# Patient Record
Sex: Female | Born: 1965
Health system: Southern US, Community
[De-identification: ages and names within clinical notes are randomized; demographics above are authoritative.]

## PROBLEM LIST (undated history)

## (undated) DIAGNOSIS — D649 Anemia, unspecified: Secondary | ICD-10-CM

## (undated) DIAGNOSIS — T8571XA Infection and inflammatory reaction due to peritoneal dialysis catheter, initial encounter: Secondary | ICD-10-CM

## (undated) DIAGNOSIS — I2510 Atherosclerotic heart disease of native coronary artery without angina pectoris: Secondary | ICD-10-CM

## (undated) DIAGNOSIS — R52 Pain, unspecified: Secondary | ICD-10-CM

## (undated) DIAGNOSIS — Z992 Dependence on renal dialysis: Secondary | ICD-10-CM

## (undated) DIAGNOSIS — N189 Chronic kidney disease, unspecified: Secondary | ICD-10-CM

## (undated) DIAGNOSIS — Z9289 Personal history of other medical treatment: Secondary | ICD-10-CM

## (undated) DIAGNOSIS — J189 Pneumonia, unspecified organism: Secondary | ICD-10-CM

## (undated) DIAGNOSIS — R002 Palpitations: Secondary | ICD-10-CM

## (undated) DIAGNOSIS — E785 Hyperlipidemia, unspecified: Secondary | ICD-10-CM

## (undated) DIAGNOSIS — E119 Type 2 diabetes mellitus without complications: Secondary | ICD-10-CM

## (undated) DIAGNOSIS — N186 End stage renal disease: Secondary | ICD-10-CM

## (undated) DIAGNOSIS — I10 Essential (primary) hypertension: Secondary | ICD-10-CM

## (undated) HISTORY — DX: Hyperlipidemia, unspecified: E78.5

## (undated) HISTORY — DX: Morbid (severe) obesity due to excess calories: E66.01

## (undated) HISTORY — DX: Palpitations: R00.2

## (undated) HISTORY — PX: CARPAL TUNNEL RELEASE: SHX101

---

## 1991-01-20 HISTORY — PX: TUBAL LIGATION: SHX77

## 1998-05-28 ENCOUNTER — Emergency Department (HOSPITAL_COMMUNITY): Admission: EM | Admit: 1998-05-28 | Discharge: 1998-05-28 | Payer: Self-pay | Admitting: Emergency Medicine

## 1998-05-29 ENCOUNTER — Emergency Department (HOSPITAL_COMMUNITY): Admission: EM | Admit: 1998-05-29 | Discharge: 1998-05-29 | Payer: Self-pay | Admitting: Emergency Medicine

## 1998-06-01 ENCOUNTER — Emergency Department (HOSPITAL_COMMUNITY): Admission: EM | Admit: 1998-06-01 | Discharge: 1998-06-01 | Payer: Self-pay | Admitting: Emergency Medicine

## 1998-09-10 ENCOUNTER — Emergency Department (HOSPITAL_COMMUNITY): Admission: EM | Admit: 1998-09-10 | Discharge: 1998-09-10 | Payer: Self-pay | Admitting: Emergency Medicine

## 1999-01-20 DIAGNOSIS — I10 Essential (primary) hypertension: Secondary | ICD-10-CM | POA: Insufficient documentation

## 1999-01-20 DIAGNOSIS — E119 Type 2 diabetes mellitus without complications: Secondary | ICD-10-CM | POA: Insufficient documentation

## 2000-02-09 ENCOUNTER — Emergency Department (HOSPITAL_COMMUNITY): Admission: EM | Admit: 2000-02-09 | Discharge: 2000-02-10 | Payer: Self-pay | Admitting: Emergency Medicine

## 2001-02-25 ENCOUNTER — Encounter: Admission: RE | Admit: 2001-02-25 | Discharge: 2001-05-26 | Payer: Self-pay | Admitting: Endocrinology

## 2001-03-26 ENCOUNTER — Emergency Department (HOSPITAL_COMMUNITY): Admission: EM | Admit: 2001-03-26 | Discharge: 2001-03-26 | Payer: Self-pay | Admitting: Emergency Medicine

## 2002-01-12 ENCOUNTER — Emergency Department (HOSPITAL_COMMUNITY): Admission: EM | Admit: 2002-01-12 | Discharge: 2002-01-12 | Payer: Self-pay | Admitting: *Deleted

## 2002-05-14 ENCOUNTER — Emergency Department (HOSPITAL_COMMUNITY): Admission: EM | Admit: 2002-05-14 | Discharge: 2002-05-14 | Payer: Self-pay | Admitting: Emergency Medicine

## 2002-07-26 ENCOUNTER — Encounter: Admission: RE | Admit: 2002-07-26 | Discharge: 2002-10-24 | Payer: Self-pay | Admitting: Endocrinology

## 2002-10-27 ENCOUNTER — Encounter: Admission: RE | Admit: 2002-10-27 | Discharge: 2003-01-25 | Payer: Self-pay | Admitting: Endocrinology

## 2008-01-11 ENCOUNTER — Ambulatory Visit: Payer: Self-pay | Admitting: Nurse Practitioner

## 2008-01-11 DIAGNOSIS — L659 Nonscarring hair loss, unspecified: Secondary | ICD-10-CM | POA: Insufficient documentation

## 2008-01-11 LAB — CONVERTED CEMR LAB
Bilirubin Urine: NEGATIVE
Blood Glucose, Fingerstick: 283
Blood in Urine, dipstick: NEGATIVE
Glucose, Urine, Semiquant: 250
Ketones, urine, test strip: NEGATIVE
Nitrite: NEGATIVE
Protein, U semiquant: 100
Specific Gravity, Urine: 1.015
Urobilinogen, UA: 0.2
pH: 6

## 2008-01-16 ENCOUNTER — Encounter (INDEPENDENT_AMBULATORY_CARE_PROVIDER_SITE_OTHER): Payer: Self-pay | Admitting: Nurse Practitioner

## 2008-01-16 DIAGNOSIS — R809 Proteinuria, unspecified: Secondary | ICD-10-CM | POA: Insufficient documentation

## 2008-01-16 LAB — CONVERTED CEMR LAB
ALT: 21 units/L (ref 0–35)
AST: 16 units/L (ref 0–37)
Albumin: 4 g/dL (ref 3.5–5.2)
Alkaline Phosphatase: 71 units/L (ref 39–117)
BUN: 26 mg/dL — ABNORMAL HIGH (ref 6–23)
Basophils Absolute: 0 10*3/uL (ref 0.0–0.1)
Basophils Relative: 0 % (ref 0–1)
CO2: 23 meq/L (ref 19–32)
Calcium: 9.8 mg/dL (ref 8.4–10.5)
Chloride: 100 meq/L (ref 96–112)
Creatinine, Ser: 1.24 mg/dL — ABNORMAL HIGH (ref 0.40–1.20)
Creatinine, Urine: 112.1 mg/dL
Eosinophils Absolute: 0.1 10*3/uL (ref 0.0–0.7)
Eosinophils Relative: 2 % (ref 0–5)
Glucose, Bld: 272 mg/dL — ABNORMAL HIGH (ref 70–99)
HCT: 39.6 % (ref 36.0–46.0)
Hemoglobin: 13.1 g/dL (ref 12.0–15.0)
Hgb A1c MFr Bld: 11.3 % — ABNORMAL HIGH (ref 4.6–6.1)
Lymphocytes Relative: 43 % (ref 12–46)
Lymphs Abs: 3.7 10*3/uL (ref 0.7–4.0)
MCHC: 33.1 g/dL (ref 30.0–36.0)
MCV: 87.6 fL (ref 78.0–100.0)
Microalb Creat Ratio: 340.8 mg/g — ABNORMAL HIGH (ref 0.0–30.0)
Microalb, Ur: 38.2 mg/dL — ABNORMAL HIGH (ref 0.00–1.89)
Monocytes Absolute: 0.8 10*3/uL (ref 0.1–1.0)
Monocytes Relative: 9 % (ref 3–12)
Neutro Abs: 3.9 10*3/uL (ref 1.7–7.7)
Neutrophils Relative %: 46 % (ref 43–77)
Platelets: 347 10*3/uL (ref 150–400)
Potassium: 5.3 meq/L (ref 3.5–5.3)
RBC: 4.52 M/uL (ref 3.87–5.11)
RDW: 13.2 % (ref 11.5–15.5)
Sodium: 136 meq/L (ref 135–145)
TSH: 1.187 microintl units/mL (ref 0.350–4.50)
Total Bilirubin: 0.3 mg/dL (ref 0.3–1.2)
Total Protein: 7.5 g/dL (ref 6.0–8.3)
WBC: 8.5 10*3/uL (ref 4.0–10.5)

## 2008-01-18 ENCOUNTER — Ambulatory Visit: Payer: Self-pay | Admitting: *Deleted

## 2008-01-24 ENCOUNTER — Encounter (INDEPENDENT_AMBULATORY_CARE_PROVIDER_SITE_OTHER): Payer: Self-pay | Admitting: Nurse Practitioner

## 2008-02-02 ENCOUNTER — Emergency Department (HOSPITAL_COMMUNITY): Admission: EM | Admit: 2008-02-02 | Discharge: 2008-02-02 | Payer: Self-pay | Admitting: Emergency Medicine

## 2008-02-07 ENCOUNTER — Encounter (INDEPENDENT_AMBULATORY_CARE_PROVIDER_SITE_OTHER): Payer: Self-pay | Admitting: Nurse Practitioner

## 2008-02-08 ENCOUNTER — Ambulatory Visit: Payer: Self-pay | Admitting: Nurse Practitioner

## 2008-02-08 DIAGNOSIS — Z992 Dependence on renal dialysis: Secondary | ICD-10-CM

## 2008-02-08 DIAGNOSIS — E669 Obesity, unspecified: Secondary | ICD-10-CM | POA: Insufficient documentation

## 2008-02-08 DIAGNOSIS — N186 End stage renal disease: Secondary | ICD-10-CM | POA: Insufficient documentation

## 2008-02-08 LAB — CONVERTED CEMR LAB: Blood Glucose, Fingerstick: 162

## 2008-02-09 ENCOUNTER — Encounter (INDEPENDENT_AMBULATORY_CARE_PROVIDER_SITE_OTHER): Payer: Self-pay | Admitting: Nurse Practitioner

## 2008-02-09 LAB — CONVERTED CEMR LAB
Cholesterol: 222 mg/dL — ABNORMAL HIGH (ref 0–200)
HDL: 54 mg/dL (ref >39)
LDL Cholesterol: 148 mg/dL — ABNORMAL HIGH (ref 0–99)
Total CHOL/HDL Ratio: 4.1
Triglycerides: 101 mg/dL (ref <150)
VLDL: 20 mg/dL (ref 0–40)

## 2008-04-09 ENCOUNTER — Ambulatory Visit: Payer: Self-pay | Admitting: Nurse Practitioner

## 2008-04-09 DIAGNOSIS — E785 Hyperlipidemia, unspecified: Secondary | ICD-10-CM | POA: Insufficient documentation

## 2008-04-09 LAB — CONVERTED CEMR LAB
ALT: 24 units/L (ref 0–35)
AST: 16 units/L (ref 0–37)
Albumin: 4.1 g/dL (ref 3.5–5.2)
Alkaline Phosphatase: 62 units/L (ref 39–117)
BUN: 17 mg/dL (ref 6–23)
Blood Glucose, Fingerstick: 153
CO2: 25 meq/L (ref 19–32)
Calcium: 9.9 mg/dL (ref 8.4–10.5)
Chloride: 103 meq/L (ref 96–112)
Cholesterol, target level: 200 mg/dL
Creatinine, Ser: 0.91 mg/dL (ref 0.40–1.20)
Glucose, Bld: 175 mg/dL — ABNORMAL HIGH (ref 70–99)
HDL goal, serum: 40 mg/dL
Hgb A1c MFr Bld: 8.2 %
LDL Goal: 100 mg/dL
Potassium: 5.1 meq/L (ref 3.5–5.3)
Sodium: 141 meq/L (ref 135–145)
Total Bilirubin: 0.3 mg/dL (ref 0.3–1.2)
Total Protein: 7.6 g/dL (ref 6.0–8.3)

## 2008-04-10 ENCOUNTER — Encounter (INDEPENDENT_AMBULATORY_CARE_PROVIDER_SITE_OTHER): Payer: Self-pay | Admitting: Nurse Practitioner

## 2008-04-12 ENCOUNTER — Ambulatory Visit: Payer: Self-pay | Admitting: Nurse Practitioner

## 2008-06-06 ENCOUNTER — Ambulatory Visit: Payer: Self-pay | Admitting: Nurse Practitioner

## 2008-06-07 ENCOUNTER — Encounter (INDEPENDENT_AMBULATORY_CARE_PROVIDER_SITE_OTHER): Payer: Self-pay | Admitting: Nurse Practitioner

## 2008-06-07 LAB — CONVERTED CEMR LAB
Cholesterol: 182 mg/dL (ref 0–200)
HDL: 50 mg/dL (ref >39)
LDL Cholesterol: 115 mg/dL — ABNORMAL HIGH (ref 0–99)
Total CHOL/HDL Ratio: 3.6
Triglycerides: 84 mg/dL (ref <150)
VLDL: 17 mg/dL (ref 0–40)

## 2008-06-25 ENCOUNTER — Telehealth (INDEPENDENT_AMBULATORY_CARE_PROVIDER_SITE_OTHER): Payer: Self-pay | Admitting: Nurse Practitioner

## 2008-07-11 ENCOUNTER — Ambulatory Visit: Payer: Self-pay | Admitting: Nurse Practitioner

## 2008-07-11 LAB — CONVERTED CEMR LAB
Blood Glucose, Fingerstick: 178
Hgb A1c MFr Bld: 7.8 %

## 2008-07-13 ENCOUNTER — Encounter (INDEPENDENT_AMBULATORY_CARE_PROVIDER_SITE_OTHER): Payer: Self-pay | Admitting: Nurse Practitioner

## 2008-11-09 ENCOUNTER — Telehealth (INDEPENDENT_AMBULATORY_CARE_PROVIDER_SITE_OTHER): Payer: Self-pay | Admitting: Nurse Practitioner

## 2009-01-08 ENCOUNTER — Telehealth (INDEPENDENT_AMBULATORY_CARE_PROVIDER_SITE_OTHER): Payer: Self-pay | Admitting: *Deleted

## 2009-01-10 ENCOUNTER — Ambulatory Visit: Payer: Self-pay | Admitting: Nurse Practitioner

## 2009-01-10 LAB — CONVERTED CEMR LAB
Bilirubin Urine: NEGATIVE
Blood Glucose, Fingerstick: 142
Blood in Urine, dipstick: NEGATIVE
Glucose, Urine, Semiquant: NEGATIVE
Hgb A1c MFr Bld: 12 %
Ketones, urine, test strip: NEGATIVE
Nitrite: NEGATIVE
Protein, U semiquant: >=300
Specific Gravity, Urine: 1.025
Urobilinogen, UA: 0.2
WBC Urine, dipstick: NEGATIVE
pH: 6

## 2009-01-19 LAB — HM PAP SMEAR: HM Pap smear: NORMAL

## 2009-02-08 ENCOUNTER — Ambulatory Visit: Payer: Self-pay | Admitting: Nurse Practitioner

## 2009-02-08 DIAGNOSIS — J069 Acute upper respiratory infection, unspecified: Secondary | ICD-10-CM | POA: Insufficient documentation

## 2009-02-08 LAB — CONVERTED CEMR LAB: Blood Glucose, Fingerstick: 128

## 2009-05-17 ENCOUNTER — Emergency Department (HOSPITAL_COMMUNITY): Admission: EM | Admit: 2009-05-17 | Discharge: 2009-05-17 | Payer: Self-pay | Admitting: Family Medicine

## 2009-08-21 ENCOUNTER — Telehealth (INDEPENDENT_AMBULATORY_CARE_PROVIDER_SITE_OTHER): Payer: Self-pay | Admitting: Nurse Practitioner

## 2009-09-12 ENCOUNTER — Emergency Department (HOSPITAL_COMMUNITY): Admission: EM | Admit: 2009-09-12 | Discharge: 2009-09-12 | Payer: Self-pay | Admitting: Family Medicine

## 2009-09-17 ENCOUNTER — Telehealth (INDEPENDENT_AMBULATORY_CARE_PROVIDER_SITE_OTHER): Payer: Self-pay | Admitting: Nurse Practitioner

## 2009-09-17 ENCOUNTER — Ambulatory Visit: Payer: Self-pay | Admitting: Nurse Practitioner

## 2009-09-19 ENCOUNTER — Encounter (INDEPENDENT_AMBULATORY_CARE_PROVIDER_SITE_OTHER): Payer: Self-pay | Admitting: Nurse Practitioner

## 2009-10-01 ENCOUNTER — Ambulatory Visit (HOSPITAL_COMMUNITY): Admission: RE | Admit: 2009-10-01 | Discharge: 2009-10-01 | Payer: Self-pay | Admitting: Internal Medicine

## 2009-11-27 ENCOUNTER — Telehealth (INDEPENDENT_AMBULATORY_CARE_PROVIDER_SITE_OTHER): Payer: Self-pay | Admitting: Nurse Practitioner

## 2010-01-19 LAB — HM MAMMOGRAPHY: HM Mammogram: NORMAL

## 2010-01-19 LAB — HM DIABETES EYE EXAM

## 2010-02-16 LAB — CONVERTED CEMR LAB
ALT: 19 units/L (ref 0–35)
AST: 17 units/L (ref 0–37)
Albumin: 4 g/dL (ref 3.5–5.2)
Alkaline Phosphatase: 64 units/L (ref 39–117)
BUN: 22 mg/dL (ref 6–23)
Basophils Absolute: 0 10*3/uL (ref 0.0–0.1)
Basophils Relative: 0 % (ref 0–1)
Bilirubin Urine: NEGATIVE
Blood Glucose, Fingerstick: 155
Blood in Urine, dipstick: NEGATIVE
CO2: 26 meq/L (ref 19–32)
Calcium: 9.3 mg/dL (ref 8.4–10.5)
Chlamydia, DNA Probe: NEGATIVE
Chloride: 101 meq/L (ref 96–112)
Cholesterol: 248 mg/dL — ABNORMAL HIGH (ref 0–200)
Creatinine, Ser: 1.13 mg/dL (ref 0.40–1.20)
Eosinophils Absolute: 0.1 10*3/uL (ref 0.0–0.7)
Eosinophils Relative: 2 % (ref 0–5)
GC Probe Amp, Genital: NEGATIVE
Glucose, Bld: 153 mg/dL — ABNORMAL HIGH (ref 70–99)
Glucose, Urine, Semiquant: NEGATIVE
HCT: 38.7 % (ref 36.0–46.0)
HDL: 58 mg/dL (ref >39)
Hemoglobin: 12.7 g/dL (ref 12.0–15.0)
Hgb A1c MFr Bld: 9.3 % — ABNORMAL HIGH (ref <5.7)
KOH Prep: NEGATIVE
Ketones, urine, test strip: NEGATIVE
LDL Cholesterol: 163 mg/dL — ABNORMAL HIGH (ref 0–99)
Lymphocytes Relative: 41 % (ref 12–46)
Lymphs Abs: 2.6 10*3/uL (ref 0.7–4.0)
MCHC: 32.8 g/dL (ref 30.0–36.0)
MCV: 89.8 fL (ref 78.0–100.0)
Microalb, Ur: 159 mg/dL — ABNORMAL HIGH (ref 0.00–1.89)
Monocytes Absolute: 0.6 10*3/uL (ref 0.1–1.0)
Monocytes Relative: 10 % (ref 3–12)
Neutro Abs: 3 10*3/uL (ref 1.7–7.7)
Neutrophils Relative %: 47 % (ref 43–77)
Nitrite: NEGATIVE
OCCULT 1: NEGATIVE
Pap Smear: NEGATIVE
Platelets: 336 10*3/uL (ref 150–400)
Potassium: 5 meq/L (ref 3.5–5.3)
Protein, U semiquant: >=300
RBC: 4.31 M/uL (ref 3.87–5.11)
RDW: 14.1 % (ref 11.5–15.5)
Sodium: 141 meq/L (ref 135–145)
Specific Gravity, Urine: >=1.03
TSH: 1.834 microintl units/mL (ref 0.350–4.500)
Total Bilirubin: 0.4 mg/dL (ref 0.3–1.2)
Total CHOL/HDL Ratio: 4.3
Total Protein: 7.2 g/dL (ref 6.0–8.3)
Triglycerides: 137 mg/dL (ref <150)
Urobilinogen, UA: 0.2
VLDL: 27 mg/dL (ref 0–40)
WBC Urine, dipstick: NEGATIVE
WBC: 6.4 10*3/uL (ref 4.0–10.5)
pH: 5.5

## 2010-02-18 NOTE — Assessment & Plan Note (Signed)
Summary: Acute - URI   Vital Signs:  Patient profile:   45 year old female Menstrual status:  no cycle in the last 5 years Weight:      270 pounds Pulse rate:   84 / minute Pulse rhythm:   regular Resp:     20 per minute BP sitting:   130 / 100  (left arm) Cuff size:   regular  Vitals Entered By: [name] (February 08, 2009 9:10 AM) CC: nasal and head congestion, real bad cough with vomiting on last night x 4 days Pain Assessment Patient in pain? no      CBG Result 128 CBG Device ID A  Does patient need assistance? Functional Status Self care Ambulation Normal   CC:  nasal and head congestion and real bad cough with vomiting on last night x 4 days.  History of Present Illness:  Pt into the office with URI for the past 5 days. +cough (non-productive) +nasal congestion -fever -ear pain  +eye pressure +headache x 1 day +decrease in appetitie  +fatigue Symptoms ongoing and usually when she gets a regular cold it only lasts for about 3 days.  Since this has lasted for longer and even progressed that is why she presents today.  Pt has been able to go to work all week but on yesterday felt the fatigue and cough was the worse. Pt has taken Dayquil OTC during the day.  Habits & Providers  Alcohol-Tobacco-Diet     Alcohol drinks/day: 0     Tobacco Status: never  Exercise-Depression-Behavior     Does Patient Exercise: yes     Exercise Counseling: to improve exercise regimen     Type of exercise: walking     Have you felt down or hopeless? no     Have you felt little pleasure in things? no     Depression Counseling: not indicated; screening negative for depression     Drug Use: no     Seat Belt Use: 100     Sun Exposure: occasionally  Allergies (verified): 1)  ! Vicodin 2)  ! * Tylenol 3  Review of Systems General:  Complains of fatigue and loss of appetite. ENT:  Complains of nasal congestion and sinus pressure; denies ear discharge and sore  throat. CV:  Denies chest pain or discomfort. Resp:  Complains of cough; denies wheezing. GI:  Complains of vomiting; x 1 episode last night. Neuro:  Complains of headaches.  Physical Exam  General:  alert.  obese, sickly appearing Head:  normocephalic.   Ears:  ear piercing(s) noted.   Bil ears with minimal cerumen able to see top of TM - left erythema Nose:  inflammed turbinates Mouth:  fair dentition.   Neck:  supple.     Impression & Recommendations:  Problem # 1:  URI (ICD-465.9)  given diabetes and length of symptoms will treat with amoxil advised pt only to take tussin for cough humdifier for congestion  Orders: Pulse Oximetry (single measurment) [id number])  Problem # 2:  DIABETES MELLITUS, TYPE II (ICD-250.00) continue current meds f/u on next visit Her updated medication list for this problem includes:    Levemir 100 Unit/ml Soln (Insulin detemir) .Marland KitchenMarland KitchenMarland KitchenMarland Kitchen 60 units at night for diabetes    Lisinopril-hydrochlorothiazide 20-25 Mg Tabs (Lisinopril-hydrochlorothiazide) .Marland Kitchen... Take one tablet by mouth daily for blood pressure    Metformin Hcl 1000 Mg Tabs (Metformin hcl) .Marland Kitchen... 1 tablet by mouth two times a day for diabetes  Orders: Capillary Blood Glucose/CBG [id number])  Complete Medication List: 1)  Levemir 100 Unit/ml Soln (Insulin detemir) .... 60 units at night for diabetes 2)  Lisinopril-hydrochlorothiazide 20-25 Mg Tabs (Lisinopril-hydrochlorothiazide) .... Take one tablet by mouth daily for blood pressure 3)  Metformin Hcl 1000 Mg Tabs (Metformin hcl) .Marland Kitchen.. 1 tablet by mouth two times a day for diabetes 4)  Insulin Syringe 31g X 5/16" 0.5 Ml Misc (Insulin syringe-needle u-100) .... To use with 60 units of insulin nightly 5)  Glucometer Elite Classic Kit (Blood glucose monitoring suppl) .... Dispense glucometer and test strips and lancets checks blood sugar twice daily 6)  Neurontin 300 Mg Caps (Gabapentin) .Marland Kitchen.. 1 capsule by mouth nightly as needed for foot pain 7)   Pravastatin Sodium 40 Mg Tabs (Pravastatin sodium) .Marland Kitchen.. 1 tablet by mouth nightly for cholesterol **note dose increase** 8)  Amoxicillin 500 Mg Caps (Amoxicillin) .... One capsule by mouth three times a day for infection  Patient Instructions: 1)  Given length of illness and history of diabetes will treat with amoxil 500mg  by mouth three times a day x 7 days. 2)  Take only tussin for cough (this is for diabetes) 3)  Decongestants raise your blood pressure (it was slightly elevated today) 4)  Follow up at next scheduled diabetes visit Prescriptions: AMOXICILLIN 500 MG CAPS (AMOXICILLIN) One capsule by mouth three times a day for infection  #21 x 0   Entered and Authorized by:   Aurora Mask FNP   Signed by:   Aurora Mask FNP on 02/08/2009   Method used:   Print then Give to Patient   RxID:   [id number]

## 2010-02-18 NOTE — Letter (Signed)
Summary: Handout Printed  Printed Handout:  - Diet - How to Increase Fiber In The Meal Plan for Diabetes (without Fiber Content of Foods Chart)

## 2010-02-18 NOTE — Letter (Signed)
Summary: *HSN Results Follow up  [place], [place] [address]   Phone: [phone]  Fax: [phone]      01/16/2008   ROMIE KEEBLE [address] [place], [place]  [address]   Dear  Ms. Eligah East,                            ____S.Drinkard,FNP   ____D. Gore,FNP       ____B. McPherson,MD   ____V. Rankins,MD    ____E. Mulberry,MD    __X__N. Hassell Done, FNP  ____D. Jobe Igo, MD    ____K. Tomma Lightning, MD    ____Other     This letter is to inform you that your recent test(s):  _______Pap Smear    ___X____Lab Test     _______X-ray   Comments:  Your recent lab visit showed your blood sugar was very elevated.  This was to be expected since you were not taking your medications.  Urine results also show that your kidneys have been affected by your diabetes.  It is very IMPORTANT that you take your medications as ordered to prevent any further kidney damage.  On your next visit, your kidney function will be rechecked.        _________________________________________________________ If you have any questions, please contact our office [phone].                    Sincerely,    Aurora Mask FNP [facility]

## 2010-02-18 NOTE — Assessment & Plan Note (Signed)
Summary: Complete Physical Exam   Vital Signs:  Patient profile:   45 year old female Menstrual status:  postmenopausal Height:      63.50 inches Weight:      276.5 pounds BMI:     48.39 Temp:     97.5 degrees F oral Pulse rate:   76 / minute Pulse rhythm:   regular Resp:     20 per minute BP sitting:   140 / 100  (left arm) Cuff size:   large  Vitals Entered By: [name] (September 17, 2009 12:25 PM)  Nutrition Counseling: Patient's BMI is greater than 25 and therefore counseled on weight management options. CC: CPP...went to [facility] urgent care she was having sharp pain in back of left leg...she said that she had pain all day in back of leg all day on Monday....pt is having bowel issues, Hypertension Management, Lipid Management Is Patient Diabetic? Yes Pain Assessment Patient in pain? no      CBG Result 155 CBG Device ID B  Does patient need assistance? Functional Status Self care Ambulation Normal     Menstrual Status postmenopausal   CC:  CPP...went to [facility] urgent care she was having sharp pain in back of left leg...she said that she had pain all day in back of leg all day on Monday....pt is having bowel issues, Hypertension Management, and Lipid Management.  History of Present Illness:  Pt into the office for a complete physical exam  PAP - last done in this office 1 year ago. No family history of cervical or ovarian CA last menses 5 years ago  Mammogram - last done 1 year ago no family hx of breast cancer no self breast exams at home  optho - last eye exam 2 years ago.  retasure exam last year  dental - no recent eye exam  tdap - over 10 years ago  Diabetes Management History:      The patient is a 45 years old female who comes in for evaluation of Type 2 Diabetes Mellitus.  She has not been enrolled in the "Diabetic Education Program".  She states understanding of dietary principles but she is not following the appropriate diet.  No sensory loss is  reported.  Self foot exams are not being performed.  She is not checking home blood sugars.  She says that she is exercising.  Type of exercise includes: walking.        Hypoglycemic symptoms are not occurring.  Other comments include: pt is not checking her blood sugar - she has lost her glucometer.    Hypertension History:      She denies headache, chest pain, and palpitations.  She notes no problems with any antihypertensive medication side effects.        Positive major cardiovascular risk factors include diabetes, hyperlipidemia, and hypertension.  Negative major cardiovascular risk factors include female age less than 58 years old, negative family history for ischemic heart disease, and non-tobacco-user status.        Further assessment for target organ damage reveals no history of ASHD, cardiac end-organ damage (CHF/LVH), stroke/TIA, peripheral vascular disease, renal insufficiency, or hypertensive retinopathy.    Lipid Management History:      Positive NCEP/ATP III risk factors include diabetes and hypertension.  Negative NCEP/ATP III risk factors include female age less than 53 years old, no family history for ischemic heart disease, non-tobacco-user status, no ASHD (atherosclerotic heart disease), no prior stroke/TIA, no peripheral vascular disease, and no history of  aortic aneurysm.        The patient states that she knows about the "Therapeutic Lifestyle Change" diet.  Her compliance with the TLC diet is good.  She expresses no side effects from her lipid-lowering medication.  The patient denies any symptoms to suggest myopathy or liver disease.      Habits & Providers  Alcohol-Tobacco-Diet     Alcohol drinks/day: 0     Tobacco Status: never  Exercise-Depression-Behavior     Does Patient Exercise: yes     Exercise Counseling: to improve exercise regimen     Type of exercise: walking     Depression Counseling: not indicated; screening negative for depression     Drug Use: no      Seat Belt Use: 100     Sun Exposure: occasionally  Comments: PHQ-9 score = 4  Allergies (verified): 1)  ! Vicodin 2)  ! * Tylenol 3  Review of Systems General:  Denies fatigue. Eyes:  Denies blurring. ENT:  Denies earache. CV:  Denies bluish discoloration of lips or nails. GI:  Denies abdominal pain. GU:  Denies discharge. MS:  Complains of muscle weakness; left posterior knee - seen in urgent care last week and was dx with muscle sprain. pt instructed to apply heat and took some advil and pain improved.  returned 2 days later (yesterday). Derm:  Denies rash. Neuro:  Denies headaches. Psych:  Denies depression. Endo:  Denies excessive urination.  Physical Exam  General:  alert and overweight-appearing.   Head:  normocephalic.   Eyes:  pupils equal, pupils round, and pupils reactive to light.   Ears:  bil TM with bony landmarks present flaky cerumen in canal Mouth:  pharynx pink and moist and fair dentition.   Neck:  supple.   Chest Wall:  no mass.   Breasts:  skin/areolae normal, no masses, and no abnormal thickening.   Lungs:  no accessory muscle use.   Heart:  normal rate and regular rhythm.   Abdomen:  normal bowel sounds.   Rectal:  external hemorrhoid(s).   Msk:  Active ROM in all extremities Pulses:  R dorsalis pedis normal and L dorsalis pedis normal.   Extremities:  no edema Neurologic:  alert & oriented X3.   Skin:  color normal.   Psych:  Oriented X3.    Pelvic Exam  Vulva:      normal appearance.   Urethra and Bladder:      Urethra--no discharge.  Bladder--normal.   Vagina:      physiologic discharge.   Cervix:      anterior.   Uterus:      smooth.   Adnexa:      nontender bilaterally.   Rectum:      heme negative stool, + external hemorrhoids.    Diabetes Management Exam:    Foot Exam (with socks and/or shoes not present):       Sensory-Monofilament:          Left foot: normal          Right foot: normal       Nails:          Left foot:  normal          Right foot: normal    Impression & Recommendations:  Problem # 1:  ROUTINE GYNECOLOGICAL EXAMINATION (ICD-V72.31) labs done  PAP done tdap due but none in office optho and dental recs given to pt Orders: Hemoccult Cards MCR Screening (G0107) [place] [phone])  Pap Smear, Thin Prep ( Collection of) [phone]) T- GC Chlamydia (60454)  Problem # 2:  UNSPECIFIED BREAST SCREENING (ICD-V76.10) self breast exam placcard given Orders: Mammogram (Screening) (Mammo)  Problem # 3:  HYPERTENSION, BENIGN ESSENTIAL (ICD-401.1) BP slightly elevated today will monitor - pt has not taken meds due to fasting status Her updated medication list for this problem includes:    Lisinopril-hydrochlorothiazide 20-25 Mg Tabs (Lisinopril-hydrochlorothiazide) .Marland Kitchen... Take one tablet by mouth daily for blood pressure  Orders: EKG w/ Interpretation (93000) T-CBC w/Diff (09811-91478) Rapid HIV  ([address]) T-TSH (13086-57846) T-Urine Microalbumin w/creat. ratio [id number]) UA Dipstick w/o Micro (manual) (81002) Fingerstick (10272)  Problem # 4:  DIABETES MELLITUS, TYPE II (ICD-250.00) will check HGBA1c (send out) Her updated medication list for this problem includes:    Levemir 100 Unit/ml Soln (Insulin detemir) .Marland KitchenMarland KitchenMarland KitchenMarland Kitchen 60 units at night for diabetes    Lisinopril-hydrochlorothiazide 20-25 Mg Tabs (Lisinopril-hydrochlorothiazide) .Marland Kitchen... Take one tablet by mouth daily for blood pressure    Metformin Hcl 1000 Mg Tabs (Metformin hcl) .Marland Kitchen... 1 tablet by mouth two times a day for diabetes  Orders: Capillary Blood Glucose/CBG (53664) T- Hemoglobin A1C (40347-42595)  Problem # 5:  HYPERCHOLESTEROLEMIA (ICD-272.0) will check labs today Her updated medication list for this problem includes:    Lipitor 10 Mg Tabs (Atorvastatin calcium) ..... One tablet by mouth nightly for cholesterol  Orders: T-Lipid Profile (63875-64332) T-Comprehensive Metabolic Panel (95188-41660)  Problem # 6:   RENAL INSUFFICIENCY (ICD-588.9)  Problem # 7:  OBESITY (ICD-278.00) pt is going to the [facility] to see about getting a membership she has questions about getting an Rx for phenteramine - provider declined request  Complete Medication List: 1)  Levemir 100 Unit/ml Soln (Insulin detemir) .... 60 units at night for diabetes 2)  Lisinopril-hydrochlorothiazide 20-25 Mg Tabs (Lisinopril-hydrochlorothiazide) .... Take one tablet by mouth daily for blood pressure 3)  Metformin Hcl 1000 Mg Tabs (Metformin hcl) .Marland Kitchen.. 1 tablet by mouth two times a day for diabetes 4)  Insulin Syringe 31g X 5/16" 0.5 Ml Misc (Insulin syringe-needle u-100) .... To use with 60 units of insulin nightly 5)  Glucometer Elite Classic Kit (Blood glucose monitoring suppl) .... Dispense glucometer and test strips and lancets checks blood sugar twice daily 6)  Neurontin 300 Mg Caps (Gabapentin) .Marland Kitchen.. 1 capsule by mouth nightly as needed for foot pain 7)  Lipitor 10 Mg Tabs (Atorvastatin calcium) .... One tablet by mouth nightly for cholesterol  Diabetes Management Assessment/Plan:      The following lipid goals have been established for the patient: Total cholesterol goal of 200; LDL cholesterol goal of 100; HDL cholesterol goal of 40; Triglyceride goal of 150.  Her blood pressure goal is < 130/80.    Hypertension Assessment/Plan:      The patient's hypertensive risk group is category C: Target organ damage and/or diabetes.  Her calculated 10 year risk of coronary heart disease is 8 %.  Today's blood pressure is 140/100.  Her blood pressure goal is < 130/80.  Lipid Assessment/Plan:      Based on NCEP/ATP III, the patient's risk factor category is "history of diabetes".  The patient's lipid goals are as follows: Total cholesterol goal is 200; LDL cholesterol goal is 100; HDL cholesterol goal is 40; Triglyceride goal is 150.    Patient Instructions: 1)  You will be notified of any abnormal labs. 2)  Keep your appointment for a  mammogram 3)  Follow up in 3 months for diabetes. 4)  Will need tdap  on next visit (none available today)  Laboratory Results   Urine Tests  Date/Time Received: September 17, 2009 12:37 PM   Routine Urinalysis   Color: lt. yellow Appearance: Clear Glucose: negative   (Normal Range: Negative) Bilirubin: negative   (Normal Range: Negative) Ketone: negative   (Normal Range: Negative) Spec. Gravity: >=1.030   (Normal Range: 1.003-1.035) Blood: negative   (Normal Range: Negative) pH: 5.5   (Normal Range: 5.0-8.0) Protein: >=300   (Normal Range: Negative) Urobilinogen: 0.2   (Normal Range: 0-1) Nitrite: negative   (Normal Range: Negative) Leukocyte Esterace: negative   (Normal Range: Negative)     Blood Tests     CBG Random:: 155    [place]et [place]/KOH Source: vaginal WBC/hpf: 1-5 Bacteria/hpf: rare Clue cells/hpf: none Yeast/hpf: none Trichomonas/hpf: none  Stool - Occult Blood Hemmoccult #1: negative Date: 09/18/2009       Last LDL:                                                 115 (06/06/2008 10:45:00 PM)        Diabetic Foot Exam    10-g (5.07) Semmes-Weinstein Monofilament Test Performed by: [name]          Right Foot          Left Foot Visual Inspection               Test Control      normal         normal Site 1         normal         normal Site 2         normal         normal Site 3         normal         normal Site 4         normal         normal Site 5         normal         normal Site 6         normal         normal Site 7         normal         normal Site 8         normal         normal Site 9         normal         normal Site 10         normal         normal  Impression      normal         normal    EKG  Procedure date:  09/18/2009  Findings:      normal:  rate 74   Prevention & Chronic Care Immunizations   Influenza vaccine: Fluvax 3+  (01/11/2008)    Tetanus booster: Not documented   Td booster deferral: Not  available  (09/17/2009)    Pneumococcal vaccine: Pneumovax  (02/08/2008)  Other Screening   Pap smear: Not documented   Pap smear action/deferral: Ordered  (09/17/2009)   Pap smear due: 09/17/2012    Mammogram: Not documented   Mammogram action/deferral: Ordered  (09/17/2009)   Smoking status: never  (  09/17/2009)  Diabetes Mellitus   HgbA1C: 12.0  (01/10/2009)   HgbA1C action/deferral: Ordered  (09/17/2009)   Hemoglobin A1C due: 12/18/2009    Eye exam: Not documented    Foot exam: yes  (09/17/2009)   Foot exam action/deferral: Do today   High risk foot: Not documented   Foot care education: Not documented    Urine microalbumin/creatinine ratio: 340.8  (01/11/2008)   Urine microalbumin action/deferral: Ordered   Urine microalbumin/cr due: 09/18/2010  Lipids   Total Cholesterol: 182  (06/06/2008)   Lipid panel action/deferral: Lipid Panel ordered   LDL: 115  (06/06/2008)   LDL Direct: Not documented   HDL: 50  (06/06/2008)   Triglycerides: 84  (06/06/2008)    SGOT (AST): 16  (04/09/2008)   BMP action: Ordered   SGPT (ALT): 24  (04/09/2008) CMP ordered    Alkaline phosphatase: 62  (04/09/2008)   Total bilirubin: 0.3  (04/09/2008)  Hypertension   Last Blood Pressure: 140 / 100  (09/17/2009)   Serum creatinine: 0.91  (04/09/2008)   BMP action: Ordered   Serum potassium 5.1  (04/09/2008) CMP ordered   Self-Management Support :   Personal Goals (by the next clinic visit) :     Personal A1C goal: 7  (09/17/2009)     Personal blood pressure goal: 130/80  (09/17/2009)     Personal LDL goal: 70  (09/17/2009)    Patient will work on the following items until the next clinic visit to reach self-care goals:     Medications and monitoring: take my medicines every day, check my blood sugar, bring all of my medications to every visit, examine my feet every day  (09/17/2009)     Eating: drink diet soda or water instead of juice or soda, use fresh or frozen vegetables, eat  baked foods instead of fried foods  (09/17/2009)     Activity: take a 30 minute walk every day  (09/17/2009)     Home glucose monitoring frequency: 1 time daily  (09/17/2009)    Diabetes self-management support: CBG self-monitoring log  (09/17/2009)    Hypertension self-management support: BP self-monitoring log  (09/17/2009)    Lipid self-management support: Not documented    Laboratory Results   Urine Tests    Routine Urinalysis   Color: lt. yellow Appearance: Clear Glucose: negative   (Normal Range: Negative) Bilirubin: negative   (Normal Range: Negative) Ketone: negative   (Normal Range: Negative) Spec. Gravity: >=1.030   (Normal Range: 1.003-1.035) Blood: negative   (Normal Range: Negative) pH: 5.5   (Normal Range: 5.0-8.0) Protein: >=300   (Normal Range: Negative) Urobilinogen: 0.2   (Normal Range: 0-1) Nitrite: negative   (Normal Range: Negative) Leukocyte Esterace: negative   (Normal Range: Negative)     Blood Tests     CBG Random:: 149m/dL    [place]et [place] WPhelps DodgeKOH: Negative   Appended Document: Complete Physical Exam     Allergies: 1)  ! Vicodin 2)  ! * Tylenol 3   Complete Medication List: 1)  Levemir 100 Unit/ml Soln (Insulin detemir) .... 60 units at night for diabetes 2)  Lisinopril-hydrochlorothiazide 20-25 Mg Tabs (Lisinopril-hydrochlorothiazide) .... Take one tablet by mouth daily for blood pressure 3)  Metformin Hcl 1000 Mg Tabs (Metformin hcl) ..Marland Kitchen. 1 tablet by mouth two times a day for diabetes 4)  Insulin Syringe 31g X 5/16" 0.5 Ml Misc (Insulin syringe-needle u-100) .... To use with 60 units of insulin nightly 5)  Glucometer Elite Classic Kit (Blood glucose monitoring suppl) ..Marland KitchenMarland KitchenMarland Kitchen  Dispense glucometer and test strips and lancets checks blood sugar twice daily 6)  Neurontin 300 Mg Caps (Gabapentin) .Marland Kitchen.. 1 capsule by mouth nightly as needed for foot pain 7)  Lipitor 10 Mg Tabs (Atorvastatin calcium) .... One tablet by mouth nightly for  cholesterol   Laboratory Results  Date/Time Received: September 18, 2009 11:29 AM   Other Tests  Rapid HIV: negative

## 2010-02-18 NOTE — Letter (Signed)
Summary: Handout Printed  Printed Handout:  - Diet - High-Fiber

## 2010-02-18 NOTE — Letter (Signed)
Summary: Handout Printed  Printed Handout:  - Diabetes, Exercise and

## 2010-02-18 NOTE — Letter (Signed)
Summary: Lipid Letter  [facility]  [address] [place], [place] [address]   Phone: [phone]  Fax: [phone]    06/07/2008  Rosalena Immerman [place], [place]  [address]  Dear [name]  We have carefully reviewed your last lipid profile from 06/06/2008 and the results are noted below with a summary of recommendations for lipid management.    Cholesterol:       182     Goal: less than 200   HDL "good" Cholesterol:   50     Goal: greater than 40   LDL "bad" Cholesterol:   115     Goal: less than 70   Triglycerides:       84     Goal: less than 150    Your cholesterol improved but is still not at goal.  You should have spoken with this office about the need to increase your cholesterol medications. You should have your cholesterol rechecked 8 weeks after starting medications.     Current Medications: 1)    Levemir 100 Unit/ml Soln (Insulin detemir) .... 60 units at night for diabetes 2)    Lisinopril-hydrochlorothiazide 20-25 Mg Tabs (Lisinopril-hydrochlorothiazide) .... Take one tablet by mouth daily for blood pressure 3)    Metformin Hcl 1000 Mg Tabs (Metformin hcl) .Marland Kitchen.. 1 tablet by mouth two times a day for diabetes 4)    Insulin Syringe 31g X 5/16" 0.5 Ml Misc (Insulin syringe-needle u-100) .... To use with 60 units of insulin nightly 5)    Glucometer Elite Classic  Kit (Blood glucose monitoring suppl) .... Dispense glucometer and test strips and lancets checks blood sugar twice daily 6)    Neurontin 300 Mg Caps (Gabapentin) .Marland Kitchen.. 1 capsule by mouth nightlyfor diabetes **rx by er** 7)    Pravastatin Sodium 40 Mg Tabs (Pravastatin sodium) .Marland Kitchen.. 1 tablet by mouth nightly for cholesterol **note dose increase**  If you have any questions, please call. We appreciate being able to work with you.   Sincerely,    [facility] Aurora Mask FNP

## 2010-02-18 NOTE — Letter (Signed)
Summary: REFERRAL/DIABETES SELF MANAGEMENT PROGRAM  REFERRAL/DIABETES SELF MANAGEMENT PROGRAM   Imported By: Roland Earl 07/25/2008 12:13:24  _____________________________________________________________________  External Attachment:    Type:   Image     Comment:   External Document

## 2010-02-18 NOTE — Assessment & Plan Note (Signed)
Summary: NEW - Establish Care   Vital Signs:  Patient Profile:   45 Years Old Female Height:     63.50 inches Weight:      275 pounds BMI:     48.12 BSA:     2.23 Temp:     97.8 degrees F oral Pulse rate:   80 / minute Pulse rhythm:   regular Resp:     20 per minute BP sitting:   120 / 82  (left arm) Cuff size:   large  Pt. in pain?   no  Vitals Entered By: [name] (January 11, 2008 3:23 PM)              Is Patient Diabetic? Yes CBG Result 283 CBG Device ID a  Does patient need assistance? Ambulation Normal     Chief Complaint:  new establish/ DM.  History of Present Illness:  Patient into the office to establish care. Pt was previously seen by Dr. Hassell Done at [facility]. Last visit there was in 7/09.  PMH - Diabetes and htn PSH - Carpal Tunnel Release, C-section FH - diabetes - father, htn - sister  Diabetes Management History:      The patient is a 45 years old female who comes in for evaluation of DM Type 2.  She states understanding of dietary principles and is following her diet appropriately.  No sensory loss is reported.  Self foot exams are not being performed.  She is not checking home blood sugars.  She says that she is exercising.  Type of exercise includes: AEROBICS.        Hypoglycemic symptoms are not occurring.  No hyperglycemic symptoms are reported.  Other comments include: Pt does not have any more test strips and so she is not able to check her blood sugar.  She was taking Levemir 60units at night but she has been taking sparingly because she has not been able to get refills.    Hypertension History:      She denies headache, chest pain, and palpitations.  She notes no problems with any antihypertensive medication side effects.        Positive major cardiovascular risk factors include diabetes and hypertension.  Negative major cardiovascular risk factors include female age less than 53 years old, negative family history for  ischemic heart disease, and non-tobacco-user status.        Further assessment for target organ damage reveals no history of ASHD, cardiac end-organ damage (CHF/LVH), stroke/TIA, peripheral vascular disease, renal insufficiency, or hypertensive retinopathy.        Prior Medications Reviewed Using: Medication Bottles  Updated Prior Medication List: LEVEMIR 100 UNIT/ML SOLN (INSULIN DETEMIR) *Larina Earthly, MD LISINOPRIL-HYDROCHLOROTHIAZIDE 20-25 MG TABS (LISINOPRIL-HYDROCHLOROTHIAZIDE) take one capsule by mouth everyday *Tanya Martin,MD METFORMIN HCL 1000 MG TABS (METFORMIN HCL) take one tablet by mouth everyday *tanya martin,md  Current Allergies (reviewed today): ! VICODIN  Past Surgical History:    Carpal tunnel release 2007    Caesarean section   Family History:    diabetes - father    htn - sisters  Social History:    3 children    tobacco - none    ETOH - none    Drug - none   Risk Factors:  Tobacco use:  never Drug use:  no Caffeine use:  0 drinks per day Alcohol use:  no Exercise:  yes    Type:  AEROBICS Seatbelt use:  100 % Sun Exposure:  occasionally  Family  History Risk Factors:    Family History of MI in females < 71 years old:  no    Family History of MI in males < 42 years old:  no   Review of Systems  General      thinning hair  CV      Denies chest pain or discomfort.  Resp      Denies cough.  GI      Denies abdominal pain, nausea, and vomiting.  Endo      Denies excessive thirst and excessive urination.   Physical Exam  General:     alert and overweight-appearing.   Head:     normocephalic.   Lungs:     normal breath sounds.   Heart:     normal rate and regular rhythm.   Abdomen:     soft, non-tender, and normal bowel sounds.  obese Msk:     up to the exam table Neurologic:     alert & oriented X3.   Psych:     Oriented X3.    Diabetes Management Exam:    Foot Exam (with socks and/or shoes not present):        Sensory-Pinprick/Light touch:          Left medial foot (L-4): normal          Left dorsal foot (L-5): normal          Left lateral foot (S-1): normal          Right medial foot (L-4): normal          Right dorsal foot (L-5): normal          Right lateral foot (S-1): normal       Inspection:          Left foot: normal          Right foot: normal    Impression & Recommendations:  Problem # 1:  DIABETES MELLITUS, TYPE II (ICD-250.00) restart meds at last dose Her updated medication list for this problem includes:    Levemir 100 Unit/ml Soln (Insulin detemir) .Marland KitchenMarland KitchenMarland KitchenMarland Kitchen 60 units at night for diabetes    Lisinopril-hydrochlorothiazide 20-25 Mg Tabs (Lisinopril-hydrochlorothiazide) .Marland Kitchen... Take one tablet by mouth daily for blood pressure    Metformin Hcl 1000 Mg Tabs (Metformin hcl) .Marland Kitchen... 1 tablet by mouth two times a day for diabetes  Orders: Capillary Blood Glucose [id number]) Fingerstick [id number]) UA Dipstick w/o Micro (manual) (81002) T-General Health Panel (CBCD, CMP, TSH) ([id number]) T-Urine Microalbumin w/creat. ratio [id number] / [id number]) T- Hemoglobin A1C [id number])   Problem # 2:  HYPERTENSION, BENIGN ESSENTIAL (ICD-401.1) continue current meds Her updated medication list for this problem includes:    Lisinopril-hydrochlorothiazide 20-25 Mg Tabs (Lisinopril-hydrochlorothiazide) .Marland Kitchen... Take one tablet by mouth daily for blood pressure  Orders: UA Dipstick w/o Micro (manual) (81002) T-General Health Panel (CBCD, CMP, TSH) ([id number]) T-Urine Microalbumin w/creat. ratio [id number] / [id number])   Problem # 3:  HAIR LOSS (ICD-704.00) will check thyroid  Complete Medication List: 1)  Levemir 100 Unit/ml Soln (Insulin detemir) .... 60 units at night for diabetes 2)  Lisinopril-hydrochlorothiazide 20-25 Mg Tabs (Lisinopril-hydrochlorothiazide) .... Take one tablet by mouth daily for blood pressure 3)  Metformin Hcl 1000 Mg Tabs (Metformin hcl) .Marland Kitchen.. 1 tablet by mouth two times a day  for diabetes 4)  Insulin Syringe 31g X 5/16" 0.5 Ml Misc (Insulin syringe-needle u-100) .... To use with 60 units of insulin nightly 5)  Glucometer Elite Classic Kit (Blood glucose monitoring suppl) .Marland KitchenMarland KitchenMarland Kitchen  Dispense glucometer and test strips and lancets checks blood sugar twice daily  Diabetes Management Assessment/Plan:      The following lipid goals have been established for the patient: Total cholesterol goal of 200; LDL cholesterol goal of 100; HDL cholesterol goal of 40; Triglyceride goal of 200.  Her blood pressure goal is < 130/80.    Hypertension Assessment/Plan:      The patient's hypertensive risk group is category C: Target organ damage and/or diabetes.  Today's blood pressure is 120/82.  Her blood pressure goal is < 130/80.   Patient Instructions: 1)  Sign to get records from Dr. Hassell Done at [facility] 2)  Start all your medications as ordered. 3)  You will be notified of any abnormal labs. 4)  Follow up in this office in 4 weeks.  Bring your medications and blood log with you.   Prescriptions: GLUCOMETER ELITE CLASSIC  KIT (BLOOD GLUCOSE MONITORING SUPPL) dispense glucometer and test strips and lancets checks blood sugar twice daily  #1 month qs x 5   Entered and Authorized by:   Aurora Mask FNP   Signed by:   Aurora Mask FNP on 01/11/2008   Method used:   Print then Give to Patient   RxID:   [id number] INSULIN SYRINGE 31G X 5/16" 0.5 ML MISC (INSULIN SYRINGE-NEEDLE U-100) To use with 60 units of insulin nightly  #100 x 5   Entered and Authorized by:   Aurora Mask FNP   Signed by:   Aurora Mask FNP on 01/11/2008   Method used:   Print then Give to Patient   RxID:   [id number] METFORMIN HCL 1000 MG TABS (METFORMIN HCL) 1 tablet by mouth two times a day for diabetes  #60 x 3   Entered and Authorized by:   Aurora Mask FNP   Signed by:   Aurora Mask FNP on 01/11/2008   Method used:   Print then Give to Patient   RxID:    [id number] LISINOPRIL-HYDROCHLOROTHIAZIDE 20-25 MG TABS (LISINOPRIL-HYDROCHLOROTHIAZIDE) Take one tablet by mouth daily for blood pressure  #30 x 3   Entered and Authorized by:   Aurora Mask FNP   Signed by:   Aurora Mask FNP on 01/11/2008   Method used:   Print then Give to Patient   RxID:   [id number] LEVEMIR 100 UNIT/ML SOLN (INSULIN DETEMIR) 60 units at night for diabetes  #5 bottles x 3   Entered and Authorized by:   Aurora Mask FNP   Signed by:   Aurora Mask FNP on 01/11/2008   Method used:   Print then Give to Patient   RxID:   [id number]  ] Laboratory Results   Urine Tests  Date/Time Recieved: January 11, 2008 4:26 PM   Routine Urinalysis   Color: lt. yellow Glucose: 250   (Normal Range: Negative) Bilirubin: negative   (Normal Range: Negative) Ketone: negative   (Normal Range: Negative) Spec. Gravity: 1.015   (Normal Range: 1.003-1.035) Blood: negative   (Normal Range: Negative) pH: 6.0   (Normal Range: 5.0-8.0) Protein: 100   (Normal Range: Negative) Urobilinogen: 0.2   (Normal Range: 0-1) Nitrite: negative   (Normal Range: Negative) Leukocyte Esterace: small   (Normal Range: Negative)     Blood Tests     CBG Random: 283      Appended Document: NEW - Establish Care        Current Allergies: ! VICODIN        Complete Medication List: 1)  Levemir 100  Unit/ml Soln (Insulin detemir) .... 60 units at night for diabetes 2)  Lisinopril-hydrochlorothiazide 20-25 Mg Tabs (Lisinopril-hydrochlorothiazide) .... Take one tablet by mouth daily for blood pressure 3)  Metformin Hcl 1000 Mg Tabs (Metformin hcl) .Marland Kitchen.. 1 tablet by mouth two times a day for diabetes 4)  Insulin Syringe 31g X 5/16" 0.5 Ml Misc (Insulin syringe-needle u-100) .... To use with 60 units of insulin nightly 5)  Glucometer Elite Classic Kit (Blood glucose monitoring suppl) .... Dispense glucometer and test strips and lancets checks blood sugar twice  daily    ]  Influenza Vaccine    Vaccine Type: Fluvax 3+    Site: left deltoid    Mfr: Sanofi Pasteur    Dose: 0.5 ml    Route: IM    Given by: [place] Shannon    Exp. Date: 07/18/2008    Lot #: [id number]    VIS given: 08/12/06 version given January 11, 2008.  Flu Vaccine Consent Questions    Do you have a history of severe allergic reactions to this vaccine? no    Any prior history of allergic reactions to egg and/or gelatin? no    Do you have a sensitivity to the preservative Thimersol? no    Do you have a past history of Guillan-Barre Syndrome? no    Do you currently have an acute febrile illness? no    Have you ever had a severe reaction to latex? no    Vaccine information given and explained to patient? yes    Are you currently pregnant? no ndc:   [id number]

## 2010-02-18 NOTE — Progress Notes (Signed)
Summary: Needles refills  Phone Note Call from Patient Call back at [facility] Phone [phone]   Caller: Patient Summary of Call: The pt needs more medical refills from her needles.  ([facility] Hassell Done FNP Initial call taken by: Alexis Goodell,  June 25, 2008 8:40 AM  Follow-up for Phone Call        forward to provider Follow-up by: Vinetta Bergamo CMA,  June 25, 2008 9:25 AM  Additional Follow-up for Phone Call Additional follow up Details #1::        rx printed and in basket fax to [facility] Additional Follow-up by: Aurora Mask FNP,  June 25, 2008 2:10 PM    Additional Follow-up for Phone Call Additional follow up Details #2::    Rx faxed to [facility]. Follow-up by: [name],  June 25, 2008 4:34 PM    Prescriptions: INSULIN SYRINGE 31G X 5/16" 0.5 ML MISC (INSULIN SYRINGE-NEEDLE U-100) To use with 60 units of insulin nightly  #30 x 5   Entered and Authorized by:   Aurora Mask FNP   Signed by:   Aurora Mask FNP on 06/25/2008   Method used:   Printed then faxed to ...       [facility] (retail)       [place], [place]  [address]       Ph: [phone] [place]       Fax: [phone]   RxID:   [id number]

## 2010-02-18 NOTE — Letter (Signed)
Summary: REQUESTING RECORDS FROM [facility]  REQUESTING RECORDS FROM [facility]   Imported By: Roland Earl 01/24/2008 15:23:50  _____________________________________________________________________  External Attachment:    Type:   Image     Comment:   External Document

## 2010-02-18 NOTE — Letter (Signed)
Summary: Lipid Letter  [facility]  [address] [place], [place] [address]   Phone: [phone]  Fax: [phone]    02/09/2008  Brenda Davis [place], [place]  [address]  Dear [name]  We have carefully reviewed your last lipid profile from 02/08/2008 and the results are noted below with a summary of recommendations for lipid management.    Cholesterol:       222     Goal: less than 200   HDL "good" Cholesterol:   54     Goal: greater than 40   LDL "bad" Cholesterol:   148     Goal: less than 70   Triglycerides:       101     Goal: less than 150  Your cholesterol was high during last visit. You should have spoken with this office about the need to start medications.  See the attached handout for suggestions on low fat, low cholesterol diet.  You will need your cholesterol rechecked during your next visit.    Adjunctive Measures (may lower LIPIDS and reduce risk of Heart Attack) include: Aerobic Exercise (20-30 minutes 3-4 times a week) Limit Alcohol Consumption Weight Reduction Aspirin 81 mg a day by mouth (if not allergic or contraindicated) Dietary Fiber 20-30 grams a day by mouth     Current Medications: 1)    Levemir 100 Unit/ml Soln (Insulin detemir) .... 60 units at night for diabetes 2)    Lisinopril-hydrochlorothiazide 20-25 Mg Tabs (Lisinopril-hydrochlorothiazide) .... Take one tablet by mouth daily for blood pressure 3)    Metformin Hcl 1000 Mg Tabs (Metformin hcl) .Marland Kitchen.. 1 tablet by mouth two times a day for diabetes 4)    Insulin Syringe 31g X 5/16" 0.5 Ml Misc (Insulin syringe-needle u-100) .... To use with 60 units of insulin nightly 5)    Glucometer Elite Classic  Kit (Blood glucose monitoring suppl) .... Dispense glucometer and test strips and lancets checks blood sugar twice daily 6)    Neurontin 300 Mg Caps (Gabapentin) .Marland Kitchen.. 1 capsule by mouth nightlyfor diabetes **rx by er** 7)    Pravastatin Sodium 20 Mg Tabs (Pravastatin sodium)  .Marland Kitchen.. 1 tablet by mouth nightly for cholesterol  If you have any questions, please call. We appreciate being able to work with you.   Sincerely,    Aurora Mask, FNP [facility]

## 2010-02-18 NOTE — Progress Notes (Signed)
Summary: needs appt  Phone Note Call from Patient Call back at Home Phone [phone]   Summary of Call: Brenda Davis was hired for [employer] (she passed her vision, the bp test with the exception of  blood sugar level with amount of 368).  Pt missed her previous physical exam which was on September and she  is wondering if the provider can see her earlier for a regular visit or at least done some lab test before her starting day which is on Dec 27.  She will be here for a regular visit on Dec 29.  Pt also needs her provider to filled out a work form. [facility] FNP  Initial call taken by: Alexis Goodell,  January 08, 2009 9:13 AM  Follow-up for Phone Call        please schedule for next available for Brenda Panda FNP Follow-up by: Bridgett Larsson RN,  January 08, 2009 12:47 PM  Additional Follow-up for Phone Call Additional follow up Details #1::        pt will come on thursday dec 23 at 2:45  Additional Follow-up by: Alexis Goodell,  January 08, 2009 4:52 PM

## 2010-02-18 NOTE — Letter (Signed)
Summary: *HSN Results Follow up  [place], [place] [address]   Phone: [phone]  Fax: [phone]      04/10/2008   Brenda Davis [address] [place], [place]  [address]   Dear  Ms. Eligah East,                            ____S.Drinkard,FNP   ____D. Gore,FNP       ____B. McPherson,MD   ____V. Rankins,MD    ____E. Mulberry,MD    __X__N. Hassell Done, FNP  ____D. Jobe Igo, MD    ____K. Tomma Lightning, MD    ____Other     This letter is to inform you that your recent test(s):  _______Pap Smear    ___X____Lab Test     _______X-ray    ___X____ is within acceptable limits  _______ requires a medication change  _______ requires a follow-up lab visit  _______ requires a follow-up visit with your provider   Comments: Kidney function ok.   Blood sugar was 175. Please continue medications as discussed during your visit. Don't forget to increase your exercise.       _________________________________________________________ If you have any questions, please contact our office [phone].                    Sincerely,    Aurora Mask FNP [facility]

## 2010-02-18 NOTE — Letter (Signed)
Summary: *HSN Results Follow up  [place], [place] [address]   Phone: [phone]  Fax: [phone]      09/19/2009   NOORA BARRICKMAN [address] [place], [place]  [address]   Dear  Ms. Eligah East,                            ____S.Drinkard,FNP   ____D. Gore,FNP       ____B. McPherson,MD   ____V. Rankins,MD    ____E. Mulberry,MD    __X__N. Hassell Done, FNP  ____D. Jobe Igo, MD    ____K. Tomma Lightning, MD    ____Other     This letter is to inform you that your recent test(s):  ___X____Pap Smear    _______Lab Test     _______X-ray    ___X____ is within acceptable limits  _______ requires a medication change  _______ requires a follow-up lab visit  _______ requires a follow-up visit with your provider   Comments:  Pap Smear results are normal.       _________________________________________________________ If you have any questions, please contact our office [phone].                    Sincerely,    Aurora Mask FNP [facility]

## 2010-02-18 NOTE — Progress Notes (Signed)
Summary: NEEDS REILL  Phone Note Call from Patient Call back at [facility] Phone [phone]   Summary of Call: The pt has problem wth her heal and unable her to walk around.  Pt is planning going out of the town.  The pt has this problem before and the provider prescribed her with neurotin medication.  [facility] [address]) Hassell Done FNP Initial call taken by: Alexis Goodell,  November 09, 2008 8:46 AM  Follow-up for Phone Call        PATIENT CALLED AND WANTS TO KNOIW IF SHE CAN GET SOMETHING CALLED INTO TODAY. SHE SAYS SHE HAS A FLIGHT THAT LEAVES AT 3 TODAY TO TAKE HER NEPHEW TO [place] FOR [place]. Follow-up by: Roberto Scales,  November 09, 2008 9:45 AM  Additional Follow-up for Phone Call Additional follow up Details #1::        [name]  November 09, 2008 9:57 AM Forward to N. Martin,FNP last filled 07/11/08    Additional Follow-up for Phone Call Additional follow up Details #2::    spoke with pt.  will send med to [facility] Follow-up by: Aurora Mask FNP,  November 09, 2008 12:38 PM  Prescriptions: NEURONTIN 300 MG CAPS (GABAPENTIN) 1 capsule by mouth nightly as needed for foot pain  #30 x 1   Entered and Authorized by:   Aurora Mask FNP   Signed by:   Aurora Mask FNP on 11/09/2008   Method used:   Electronically to        [facility] (retail)       [address]       [place], [place]  [address]       Ph: [phone]       Fax: [phone]   RxID:   [id number]

## 2010-02-18 NOTE — Progress Notes (Signed)
Summary: MISPLACED HER GLUCOMETER   Phone Note Call from Patient Call back at [facility] Phone [phone]   Summary of Call: [place] SAYS THAT SHE HAS MISPLACED HER GLUCOMETER AND CANT FIND IT ANYWHERE. SHE WANTS TO KNOW CAN SHE GET ANOTHER ONE.  ITS CALLED [facility] AND SHE USES [facility]. Initial call taken by: Roberto Scales,  August 21, 2009 11:14 AM  Follow-up for Phone Call        forward to N. Hassell Done, fnp Follow-up by: [name],  August 21, 2009 12:24 PM  Additional Follow-up for Phone Call Additional follow up Details #1::        [facility] will not replace the meter if it  has been less than 2 years since she last got one she can purchase one at any pharmacy - she can get either the [facility] or [facility] brand meter and the supplies will be cheaper Additional Follow-up by: Aurora Mask FNP,  August 21, 2009 12:32 PM    Additional Follow-up for Phone Call Additional follow up Details #2::    pt informed of above information. Follow-up by: [name],  August 23, 2009 2:24 PM

## 2010-02-18 NOTE — Progress Notes (Signed)
Summary: gabapenting  refill  Phone Note Call from Patient   Reason for Call: Refill Medication Summary of Call: PT NEED A REFILL OF gabapenting  PT USED  [facility] [place] . PLEASE, CALL HER @ [phone]  THANK YOU . PT NEED IT TODAY BECAUSE SHE HAVE TO GO TO WORK TOMORROW . Initial call taken by: Maren Reamer,  November 27, 2009 8:26 AM  Follow-up for Phone Call        patient called back to see if her rx has been sent in, she says she is walking on crutches. Follow-up by: Roberto Scales,  November 27, 2009 2:25 PM  Additional Follow-up for Phone Call Additional follow up Details #1::        Do you want to refill gabapentin?  Last seen 08/2009, Rx last filled 10/2008.  Sherian Maroon RN  November 27, 2009 5:20 PM     Additional Follow-up for Phone Call Additional follow up Details #2::    yes, ok to refill Follow-up by: Aurora Mask FNP,  November 27, 2009 6:39 PM  Additional Follow-up for Phone Call Additional follow up Details #3:: Details for Additional Follow-up Action Taken: Refill sent to [facility] per pt request.  Sherian Maroon RN  November 28, 2009 10:26 AM   Prescriptions: NEURONTIN 300 MG CAPS (GABAPENTIN) 1 capsule by mouth nightly as needed for foot pain  #30 x 1   Entered by:   Sherian Maroon RN   Authorized by:   Aurora Mask FNP   Signed by:   Sherian Maroon RN on 11/28/2009   Method used:   [facility]       [address]       [place],   [address]       Ph: [phone]       Fax: [phone]   RxID:   [id number]

## 2010-02-18 NOTE — Assessment & Plan Note (Signed)
Summary: Diabetes   Vital Signs:  Patient profile:   45 year old female Menstrual status:  no cycle in the last 5 years Weight:      273.0 pounds BMI:     47.77 BSA:     2.22 Temp:     97.4 degrees F oral Pulse rate:   71 / minute Pulse rhythm:   regular Resp:     16 per minute BP sitting:   136 / 86  (left arm) Cuff size:   regular  Vitals Entered ByIsla Pence (January 10, 2009 3:04 PM) CC: follow-up visit DM, Hypertension Management, Lipid Management Is Patient Diabetic? Yes CBG Result 142 CBG Device ID B  Does patient need assistance? Functional Status Self care Ambulation Normal   CC:  follow-up visit DM, Hypertension Management, and Lipid Management.  History of Present Illness:  Pt into the office for follow up on diabetes.  No visit in several months.  Diabetes - Pt has just restarted her meds about 3 days ago.  previously taking very sporatically as she states that she just did not feel like taking her medications. She is startinga new job and [employer] there. She has a form today for completion.  Pt does want to work and she is more focused at trying to control blood sugar. She does have her medications and refills on the meds.  Hypertension History:      She denies headache, chest pain, and palpitations.        Positive major cardiovascular risk factors include diabetes, hyperlipidemia, and hypertension.  Negative major cardiovascular risk factors include female age less than 95 years old, negative family history for ischemic heart disease, and non-tobacco-user status.        Further assessment for target organ damage reveals no history of ASHD, cardiac end-organ damage (CHF/LVH), stroke/TIA, peripheral vascular disease, renal insufficiency, or hypertensive retinopathy.    Lipid Management History:      Positive NCEP/ATP III risk factors include diabetes and hypertension.  Negative NCEP/ATP III risk factors include female age less than 81 years old,  no family history for ischemic heart disease, non-tobacco-user status, no ASHD (atherosclerotic heart disease), no prior stroke/TIA, no peripheral vascular disease, and no history of aortic aneurysm.        The patient states that she knows about the "Therapeutic Lifestyle Change" diet.  Her compliance with the TLC diet is good.  The patient does not know about adjunctive measures for cholesterol lowering.      Habits & Providers  Alcohol-Tobacco-Diet     Alcohol drinks/day: 0     Tobacco Status: never  Exercise-Depression-Behavior     Does Patient Exercise: yes     Exercise Counseling: to improve exercise regimen     Type of exercise: walking     Have you felt down or hopeless? no     Have you felt little pleasure in things? no     Depression Counseling: not indicated; screening negative for depression     Drug Use: no     Seat Belt Use: 100     Sun Exposure: occasionally  Allergies (verified): 1)  ! Vicodin 2)  ! * Tylenol 3  Review of Systems CV:  Denies chest pain or discomfort. Resp:  Denies cough. GI:  Denies abdominal pain, nausea, and vomiting.  Physical Exam  General:  alert.  obese Head:  normocephalic.   Lungs:  normal breath sounds.   Heart:  normal rate and regular rhythm.  Abdomen:  normal bowel sounds.   Msk:  up to the exam table Neurologic:  alert & oriented X3.   Skin:  color normal.   Psych:  Oriented X3.     Impression & Recommendations:  Problem # 1:  DIABETES MELLITUS, TYPE II (ICD-250.00) pt has only recently restarted on her insulin advised her that she needs to take her insulin as ordered Her updated medication list for this problem includes:    Levemir 100 Unit/ml Soln (Insulin detemir) .Marland KitchenMarland KitchenMarland KitchenMarland Kitchen 60 units at night for diabetes    Lisinopril-hydrochlorothiazide 20-25 Mg Tabs (Lisinopril-hydrochlorothiazide) .Marland Kitchen... Take one tablet by mouth daily for blood pressure    Metformin Hcl 1000 Mg Tabs (Metformin hcl) .Marland Kitchen... 1 tablet by mouth two times a  day for diabetes  Orders: Capillary Blood Glucose/CBG (82948) Hgb A1C [id number]) UA Dipstick w/o Micro (manual) (81002) T-Urine Microalbumin w/creat. ratio [phone])  Problem # 2:  HYPERTENSION, BENIGN ESSENTIAL (ICD-401.1) DASH diet continue current meds Her updated medication list for this problem includes:    Lisinopril-hydrochlorothiazide 20-25 Mg Tabs (Lisinopril-hydrochlorothiazide) .Marland Kitchen... Take one tablet by mouth daily for blood pressure  Problem # 3:  HYPERCHOLESTEROLEMIA (ICD-272.0) will check lab on next visit Her updated medication list for this problem includes:    Pravastatin Sodium 40 Mg Tabs (Pravastatin sodium) .Marland Kitchen... 1 tablet by mouth nightly for cholesterol **note dose increase**  Problem # 4:  OBESITY (ICD-278.00) advised pt that she needs to increase exercise  Problem # 5:  NEED PROPHYLACTIC VACCINATION&INOCULATION FLU (ICD-V04.81) indication: diabetes  Complete Medication List: 1)  Levemir 100 Unit/ml Soln (Insulin detemir) .... 60 units at night for diabetes 2)  Lisinopril-hydrochlorothiazide 20-25 Mg Tabs (Lisinopril-hydrochlorothiazide) .... Take one tablet by mouth daily for blood pressure 3)  Metformin Hcl 1000 Mg Tabs (Metformin hcl) .Marland Kitchen.. 1 tablet by mouth two times a day for diabetes 4)  Insulin Syringe 31g X 5/16" 0.5 Ml Misc (Insulin syringe-needle u-100) .... To use with 60 units of insulin nightly 5)  Glucometer Elite Classic Kit (Blood glucose monitoring suppl) .... Dispense glucometer and test strips and lancets checks blood sugar twice daily 6)  Neurontin 300 Mg Caps (Gabapentin) .Marland Kitchen.. 1 capsule by mouth nightly as needed for foot pain 7)  Pravastatin Sodium 40 Mg Tabs (Pravastatin sodium) .Marland Kitchen.. 1 tablet by mouth nightly for cholesterol **note dose increase**  Hypertension Assessment/Plan:      The patient's hypertensive risk group is category C: Target organ damage and/or diabetes.  Her calculated 10 year risk of coronary heart disease is 5  %.  Today's blood pressure is 136/86.  Her blood pressure goal is < 130/80.  Lipid Assessment/Plan:      Based on NCEP/ATP III, the patient's risk factor category is "history of diabetes".  The patient's lipid goals are as follows: Total cholesterol goal is 200; LDL cholesterol goal is 100; HDL cholesterol goal is 40; Triglyceride goal is 150.    Patient Instructions: 1)  You will need to take your medications as ordered 2)  Schedule your next appointment for a complete physical exam and diabetes check. You will need to be fasting for this appointment for a cholesterol check. 3)  Diabetes - uncontrolled. 4)  Your Hgba1c = 12  5)  You will need to take your insulin as ordered. No excuses.  This is really making your kidneys work harder than they should because there is protein in your urine. 6)  Use Aquaphor ointment to your feet for dry skin  Laboratory Results  Urine Tests  Date/Time Received: January 10, 2009 4:02 PM   Routine Urinalysis   Color: lt. yellow Glucose: negative   (Normal Range: Negative) Bilirubin: negative   (Normal Range: Negative) Ketone: negative   (Normal Range: Negative) Spec. Gravity: 1.025   (Normal Range: 1.003-1.035) Blood: negative   (Normal Range: Negative) pH: 6.0   (Normal Range: 5.0-8.0) Protein: >=300   (Normal Range: Negative) Urobilinogen: 0.2   (Normal Range: 0-1) Nitrite: negative   (Normal Range: Negative) Leukocyte Esterace: negative   (Normal Range: Negative)     Blood Tests   Date/Time Received: January 10, 2009 4:01 PM   HGBA1C: 12.0%   (Normal Range: Non-Diabetic - 3-6%   Control Diabetic - 6-8%) CBG Random:: 142mg /dL

## 2010-02-18 NOTE — Progress Notes (Signed)
Summary: Office Visit//DEPRESSION SCREENING  Office Visit//DEPRESSION SCREENING   Imported By: Roland Earl 09/18/2009 09:42:42  _____________________________________________________________________  External Attachment:    Type:   Image     Comment:   External Document

## 2010-02-18 NOTE — Letter (Signed)
Summary: Handout Printed  Printed Handout:  - Diet - Low-Fat, Low-Saturated-Fat, Low-Cholesterol Diets 

## 2010-02-18 NOTE — Progress Notes (Signed)
Summary: SYRINGES  Phone Note Call from Patient   Summary of Call: PT FORGOT TO GET RX FOR HER SYRINGES.  HEALTH DEPT. PHARMACY. Initial call taken by: Shellia Carwin CMA,  September 17, 2009 3:08 PM  Follow-up for Phone Call        Rx in basket - fax to [facility] she will need to check there to see when they will be available Follow-up by: Aurora Mask FNP,  September 18, 2009 9:06 AM  Additional Follow-up for Phone Call Additional follow up Details #1::        Rx faxed to [facility] Additional Follow-up by: [name],  September 18, 2009 2:29 PM    Prescriptions: INSULIN SYRINGE 31G X 5/16" 0.5 ML MISC (INSULIN SYRINGE-NEEDLE U-100) To use with 60 units of insulin nightly  #30 x 5   Entered and Authorized by:   Aurora Mask FNP   Signed by:   Aurora Mask FNP on 09/18/2009   Method used:   Printed then faxed to ...       [facility] Department (retail)       [address] [place], [place]  [address]       Ph: [phone]       Fax: [phone]   RxID:   [id number]

## 2010-02-18 NOTE — Letter (Signed)
Summary: [facility]  [facility]   Imported By: Roland Earl 02/13/2009 15:40:25  _____________________________________________________________________  External Attachment:    Type:   Image     Comment:   External Document

## 2010-02-18 NOTE — Miscellaneous (Signed)
Summary: Hx - [place]  Clinical Lists Changes Full records available in historical file

## 2010-02-18 NOTE — Letter (Signed)
Summary: RECEIVED RECORDS FROM [facility]/SCANNED  RECEIVED RECORDS FROM [facility] FAM[facility]/SCANNED   Imported By: Roland Earl 02/08/2008 09:12:48  _____________________________________________________________________  External Attachment:    Type:   Image     Comment:   External Document

## 2010-02-18 NOTE — Letter (Signed)
Summary: Out of Work  [facility]  [address] [place], [place] [address]   Phone: [phone]  Fax: [phone]    February 08, 2009   Employee:  Brenda Davis    To Whom It May Concern:   For Medical reasons, please excuse the above named employee from work for the following dates:  Start:   January 21st, 2011  End:   January 24th, 2011  If you need additional information, please feel free to contact our office.         Sincerely,    Aurora Mask FNP [place]

## 2010-04-16 ENCOUNTER — Ambulatory Visit (INDEPENDENT_AMBULATORY_CARE_PROVIDER_SITE_OTHER): Payer: Self-pay

## 2010-04-16 ENCOUNTER — Inpatient Hospital Stay (INDEPENDENT_AMBULATORY_CARE_PROVIDER_SITE_OTHER)
Admission: RE | Admit: 2010-04-16 | Discharge: 2010-04-16 | Disposition: A | Discharge status: Home / Self Care | Payer: Self-pay | Source: Ambulatory Visit | Attending: Family Medicine | Admitting: Family Medicine

## 2010-04-16 DIAGNOSIS — M79609 Pain in unspecified limb: Secondary | ICD-10-CM

## 2010-04-16 DIAGNOSIS — M7989 Other specified soft tissue disorders: Secondary | ICD-10-CM

## 2010-05-05 LAB — GLUCOSE, CAPILLARY: Glucose-Capillary: 164 mg/dL — ABNORMAL HIGH (ref 70–99)

## 2011-01-14 ENCOUNTER — Encounter: Payer: Self-pay | Admitting: *Deleted

## 2011-01-14 ENCOUNTER — Emergency Department (INDEPENDENT_AMBULATORY_CARE_PROVIDER_SITE_OTHER): Payer: BC Managed Care – PPO

## 2011-01-14 ENCOUNTER — Emergency Department (HOSPITAL_COMMUNITY)
Admission: EM | Admit: 2011-01-14 | Discharge: 2011-01-14 | Disposition: A | Discharge status: Home / Self Care | Payer: BC Managed Care – PPO | Source: Home / Self Care | Attending: Family Medicine | Admitting: Family Medicine

## 2011-01-14 DIAGNOSIS — M109 Gout, unspecified: Secondary | ICD-10-CM

## 2011-01-14 HISTORY — DX: Essential (primary) hypertension: I10

## 2011-01-14 LAB — URIC ACID: Uric Acid, Serum: 10.2 mg/dL — ABNORMAL HIGH (ref 2.4–7.0)

## 2011-01-14 MED ORDER — COLCHICINE 0.6 MG PO TABS: 0.6000 mg | ORAL_TABLET | Freq: Two times a day (BID) | ORAL | Status: DC

## 2011-01-14 NOTE — ED Notes (Signed)
Pt  Reports  r  Foot  Pain    Which  Started  Yesterday     denys  Any  specefic  Injury  Pt  Has  Neuropathy in past  Has  Had  Tingling  But  Never any  Pain yet  -

## 2011-01-14 NOTE — ED Provider Notes (Signed)
History     CSN: [id number]  Arrival date & time 01/14/11  1316   First MD Initiated Contact with Patient 01/14/11 1607      Chief Complaint  Patient presents with  . Foot Pain    (Consider location/radiation/quality/duration/timing/severity/associated sxs/prior treatment) Patient is a 45 y.o. female presenting with lower extremity pain. The history is provided by the patient.  Foot Pain This is a new problem. The current episode started yesterday (h/o neuropathy with tingling but no pain, no recent trauma.). The problem occurs constantly. The problem has been gradually worsening. The symptoms are aggravated by walking. The symptoms are relieved by nothing.    Past Medical History  Diagnosis Date  . Hypertension   . Diabetes mellitus     History reviewed. No pertinent past surgical history.  Family History  Problem Relation Age of Onset  . Hypertension Mother   . Diabetes Father     History  Substance Use Topics  . Smoking status: Never Smoker   . Smokeless tobacco: Not on file  . Alcohol Use: No    OB History    Grav Para Term Preterm Abortions TAB SAB Ect Mult Living                  Review of Systems  Constitutional: Negative.   Musculoskeletal: Positive for joint swelling and gait problem.  Skin: Negative.     Allergies  Hydrocodone-acetaminophen and Tylenol-codeine  Home Medications   Current Outpatient Rx  Name Route Sig Dispense Refill  . LANTUS [place] Subcutaneous Inject 60 Units into the skin 1 day or 1 dose.      Marland Kitchen LISINOPRIL-HYDROCHLOROTHIAZIDE 20-25 MG PO TABS Oral Take 1 tablet by mouth daily.      Marland Kitchen METFORMIN HCL 500 MG PO TABS Oral Take 500 mg by mouth 2 (two) times daily with a meal.      . COLCHICINE 0.6 MG PO TABS Oral Take 1 tablet (0.6 mg total) by mouth 2 (two) times daily. 30 tablet 1    BP 175/105  Pulse 84  Temp(Src) 97.6 F (36.4 C) (Oral)  Resp 16  SpO2 97%  Physical Exam  Nursing note and vitals  reviewed. Constitutional: She is oriented to person, place, and time. She appears well-developed and well-nourished.  Musculoskeletal: She exhibits tenderness.       Feet:  Neurological: She is alert and oriented to person, place, and time.  Skin: Skin is warm and dry. No rash noted. No erythema. No pallor.    ED Course  Procedures (including critical care time)   Labs Reviewed  URIC ACID   Dg Foot Complete Right  01/14/2011  *RADIOLOGY REPORT*  Clinical Data: 45 year old female with pain and swelling.  No known injury.  RIGHT FOOT COMPLETE - 3+ VIEW  Comparison: None.  Findings: Degenerative spurring some joint space loss at the right first MTP joint. Suggestion of a periarticular erosion at the base of the right first proximal phalanx is noted on the lateral.  Other joint spaces appear preserved.  Calcaneus appears intact with some degenerative spurring. Bone mineralization is within normal limits.  No acute fracture or dislocation identified.  IMPRESSION:  Sequelae of gout suspected at the right first MTP joint. Otherwise no acute osseous abnormality identified in the right foot.  Original Report Authenticated By: Randall An, M.D.     1. Gout attack       MDM  X-rays reviewed and report per radiologist.  Pauline Good, MD 01/14/11 [phone]

## 2011-01-15 ENCOUNTER — Telehealth (HOSPITAL_COMMUNITY): Payer: Self-pay | Admitting: *Deleted

## 2011-01-15 NOTE — ED Notes (Signed)
Uric Acid: 10.2 H. Pt. adequately treated with Colchicine.  Lab shown to Dr. Juventino Slovak. He said to call pt. and give her the result. Tell her to f/u with PCP in early Jan. as planned and tell him the result. Roselyn Meier 01/15/2011

## 2011-03-20 ENCOUNTER — Emergency Department (INDEPENDENT_AMBULATORY_CARE_PROVIDER_SITE_OTHER)
Admission: EM | Admit: 2011-03-20 | Discharge: 2011-03-20 | Disposition: A | Discharge status: Home Health | Payer: BC Managed Care – PPO | Source: Home / Self Care | Attending: Emergency Medicine | Admitting: Emergency Medicine

## 2011-03-20 ENCOUNTER — Encounter (HOSPITAL_COMMUNITY): Payer: Self-pay | Admitting: Emergency Medicine

## 2011-03-20 DIAGNOSIS — M25559 Pain in unspecified hip: Secondary | ICD-10-CM

## 2011-03-20 DIAGNOSIS — M25551 Pain in right hip: Secondary | ICD-10-CM

## 2011-03-20 LAB — POCT URINALYSIS DIP (DEVICE)
Bilirubin Urine: NEGATIVE
Glucose, UA: 100 mg/dL — AB
Ketones, ur: NEGATIVE mg/dL
Leukocytes, UA: NEGATIVE
Nitrite: NEGATIVE
Protein, ur: >=300 mg/dL — AB
Specific Gravity, Urine: 1.02 (ref 1.005–1.030)
Urobilinogen, UA: 0.2 mg/dL (ref 0.0–1.0)
pH: 6 (ref 5.0–8.0)

## 2011-03-20 MED ORDER — METAXALONE 800 MG PO TABS: 800.0000 mg | ORAL_TABLET | Freq: Three times a day (TID) | ORAL | Status: AC

## 2011-03-20 MED ORDER — DICLOFENAC SODIUM 1 % TD GEL: 1.0000 "application " | Freq: Four times a day (QID) | TRANSDERMAL | Status: DC

## 2011-03-20 NOTE — ED Provider Notes (Signed)
History     CSN: [id number]  Arrival date & time 03/20/11  0807   First MD Initiated Contact with Patient 03/20/11 0809      Chief Complaint  Patient presents with  . Back Pain    (Consider location/radiation/quality/duration/timing/severity/associated sxs/prior treatment) HPI Comments: Patient with  burning lateral right thigh pain pain that goes to her inner thigh starting 8 days ago. States it feels like somebody is "grinding their fists" into her leg. Pain is worse with bending over and leg abduction. No Alleviating factors. No back pain, knee pain, weakness, swelling, rash, bruising, distal paresthesias. No recent or remote history of injury to her back or hip. No nausea, vomiting, fevers, abdominal pain, urinary urgency, urinary frequency, dysuria, hematuria, oderous or cloudy urine. No unintentional weight loss. Only new medication is the change in the dosage of her metoprolol. No similar symptoms previously. States her glucose has been under good control. Patient has not tried anything for her symptoms.  ROS as noted in HPI. All other ROS negative.   The history is provided by the patient. No language interpreter was used.    Past Medical History  Diagnosis Date  . Hypertension   . Diabetes mellitus   . Gout   . Depression     History reviewed. No pertinent past surgical history.  Family History  Problem Relation Age of Onset  . Hypertension Mother   . Diabetes Father     History  Substance Use Topics  . Smoking status: Never Smoker   . Smokeless tobacco: Not on file  . Alcohol Use: No    OB History    Grav Para Term Preterm Abortions TAB SAB Ect Mult Living                  Review of Systems  Allergies  Hydrocodone-acetaminophen and Tylenol-codeine  Home Medications   Current Outpatient Rx  Name Route Sig Dispense Refill  . ALLOPURINOL 100 MG PO TABS Oral Take 100 mg by mouth daily.    Marland Kitchen AMLODIPINE BESYLATE 5 MG PO TABS Oral Take 5 mg by mouth  daily.    Marland Kitchen LISINOPRIL-HYDROCHLOROTHIAZIDE 20-12.5 MG PO TABS Oral Take 1 tablet by mouth daily.    Marland Kitchen METOPROLOL TARTRATE 100 MG PO TABS Oral Take 100 mg by mouth 2 (two) times daily.    Marland Kitchen DICLOFENAC SODIUM 1 % TD GEL Topical Apply 1 application topically 4 (four) times daily. 100 g 0  . LANTUS [place] Subcutaneous Inject 60 Units into the skin 1 day or 1 dose.     Marland Kitchen METAXALONE 800 MG PO TABS Oral Take 1 tablet (800 mg total) by mouth 3 (three) times daily. 21 tablet 0  . METFORMIN HCL 500 MG PO TABS Oral Take 1,000 mg by mouth 2 (two) times daily with a meal.       BP 151/94  Pulse 76  Temp(Src) 98.9 F (37.2 C) (Oral)  Resp 17  SpO2 96%  Physical Exam  Nursing note and vitals reviewed. Constitutional: She is oriented to person, place, and time. She appears well-developed and well-nourished. No distress.  HENT:  Head: Normocephalic and atraumatic.  Eyes: Conjunctivae and EOM are normal.  Neck: Normal range of motion.  Cardiovascular: Normal rate.   Pulmonary/Chest: Effort normal.  Abdominal: She exhibits no distension.  Musculoskeletal: Normal range of motion.       Lumbar back: Normal.       Pain aggravated with active abduction of leg. No pain with active  flexion/extension of hip. No signs of trauma R hip. No bruising, erythema, rash. Diffuse muscular tenderness over lateral gluteal muscles and along the upper aspect of the IT band. No tenderness over the quadriceps. No pain with passive abduction/adduction of leg. No pain/pain with int/ext rotation hip. No tenderness at sciatic notch. Roll test for muscle spasm negative. Flexion/extension knee WNL. Knee joint NT. Motor strenght flexion/ext hip 5/5. Sensation to LT intact. DP 2+  Neurological: She is alert and oriented to person, place, and time.  Skin: Skin is warm and dry.  Psychiatric: She has a normal mood and affect. Her behavior is normal. Judgment and thought content normal.    ED Course  Procedures (including critical care  time)  Labs Reviewed  POCT URINALYSIS DIP (DEVICE) - Abnormal; Notable for the following:    Glucose, UA 100 (*)    Hgb urine dipstick SMALL (*)    Protein, ur >=300 (*)    All other components within normal limits   No results found.   1. Hip pain, right    Results for orders placed during the hospital encounter of 03/20/11  POCT URINALYSIS DIP (DEVICE)      Component Value Range   Glucose, UA 100 (*) NEGATIVE (mg/dL)   Bilirubin Urine NEGATIVE  NEGATIVE    Ketones, ur NEGATIVE  NEGATIVE (mg/dL)   Specific Gravity, Urine 1.020  1.005 - 1.030    Hgb urine dipstick SMALL (*) NEGATIVE    pH 6.0  5.0 - 8.0    Protein, ur >=300 (*) NEGATIVE (mg/dL)   Urobilinogen, UA 0.2  0.0 - 1.0 (mg/dL)   Nitrite NEGATIVE  NEGATIVE    Leukocytes, UA NEGATIVE  NEGATIVE      MDM  previous records reviewed. Additional medical history obtained from records. [place] narcotic database reviewed, no narcotic prescriptions  Udip noted. Patient without urinary complaints. No signs of shingles, bony tenderness. Pain seems primarily along the IT band. Will have her start topical diclofenac, muscle relaxants. Will have her followup with her Dr. in several days no improvement.  Cherly Beach, MD 03/20/11 [phone]

## 2011-03-20 NOTE — ED Notes (Signed)
C/o aching pain in Rt lower back that radiates into rt thigh.  Sx of 8 days.  No hx of back pain. Denies urinary sx.

## 2011-03-20 NOTE — Discharge Instructions (Signed)
Keep an eye out for a rash. Take the medication as written. Take 1 gram of tylenol  up to 4 times a day as needed for pain and fever. Return if you get worse, have a  fever >100.4, or for any concerns.

## 2011-03-21 ENCOUNTER — Emergency Department (HOSPITAL_COMMUNITY)
Admission: EM | Admit: 2011-03-21 | Discharge: 2011-03-21 | Disposition: A | Discharge status: Home / Self Care | Payer: BC Managed Care – PPO | Source: Home / Self Care | Attending: Emergency Medicine | Admitting: Emergency Medicine

## 2011-03-21 ENCOUNTER — Encounter (HOSPITAL_COMMUNITY): Payer: Self-pay

## 2011-03-21 DIAGNOSIS — B029 Zoster without complications: Secondary | ICD-10-CM

## 2011-03-21 MED ORDER — TRAMADOL HCL 50 MG PO TABS
100.0000 mg | ORAL_TABLET | Freq: Three times a day (TID) | ORAL | Status: AC | PRN
Start: 2011-03-21 — End: 2011-03-31

## 2011-03-21 MED ORDER — ACYCLOVIR 400 MG PO TABS: 800.0000 mg | ORAL_TABLET | ORAL | Status: AC

## 2011-03-21 NOTE — Discharge Instructions (Signed)
Shingles Shingles is caused by the same virus that causes chickenpox (varicella zoster virus or VZV). Shingles often occurs many years or decades after having chickenpox. That is why it is more common in adults older than 50 years. The virus reactivates and breaks out as an infection in a nerve root. SYMPTOMS   The initial feeling (sensations) may be pain. This pain is usually described as:   Burning.   Stabbing.   Throbbing.   Tingling in the nerve root.   A red rash will follow in a couple days. The rash may occur in any area of the body and is usually on one side (unilateral) of the body in a band or belt-like pattern. The rash usually starts out as very small blisters (vesicles). They will dry up after 7 to 10 days. This is not usually a significant problem except for the pain it causes.   Long-lasting (chronic) pain is more likely in an elderly person. It can last months to years. This condition is called postherpetic neuralgia.  Shingles can be an extremely severe infection in someone with AIDS, a weakened immune system, or with forms of leukemia. It can also be severe if you are taking transplant medicines or other medicines that weaken the immune system. TREATMENT  Your caregiver will often treat you with:  Antiviral drugs.   Anti-inflammatory drugs.   Pain medicines.  Bed rest is very important in preventing the pain associated with herpes zoster (postherpetic neuralgia). Application of heat in the form of a hot water bottle or electric heating pad or gentle pressure with the hand is recommended to help with the pain or discomfort. PREVENTION  A varicella zoster vaccine is available to help protect against the virus. The Food and Drug Administration approved the varicella zoster vaccine for individuals 94 years of age and older. HOME CARE INSTRUCTIONS   Cool compresses to the area of rash may be helpful.   Only take over-the-counter or prescription medicines for pain,  discomfort, or fever as directed by your caregiver.   Avoid contact with:   Babies.   Pregnant women.   Children with eczema.   Elderly people with transplants.   People with chronic illnesses, such as leukemia and AIDS.   If the area involved is on your face, you may receive a referral for follow-up to a specialist. It is very important to keep all follow-up appointments. This will help avoid eye complications, chronic pain, or disability.  SEEK IMMEDIATE MEDICAL CARE IF:   You develop any pain (headache) in the area of the face or eye. This must be followed carefully by your caregiver or ophthalmologist. An infection in part of your eye (cornea) can be very serious. It could lead to blindness.   You do not have pain relief from prescribed medicines.   Your redness or swelling spreads.   The area involved becomes very swollen and painful.   You have a fever.   You notice any red or painful lines extending away from the affected area toward your heart (lymphangitis).   Your condition is worsening or has changed.  Document Released: 01/05/2005 Document Revised: 09/17/2010 Document Reviewed: 12/10/2008 [facility] Patient Information 2012 [place].

## 2011-03-21 NOTE — ED Provider Notes (Signed)
Chief Complaint  Patient presents with  . Rash    History of Present Illness:   The patient has an area of pain and skin sensitivity on the right buttock extending to the right hip since February 21. She was here yesterday and nothing was seen on the skin. She was given some pain medication. Today she broke out in a rash in that area which is tender to touch and erythematous. She does have a history of chickenpox in the past. No prior history of shingles.  Review of Systems:  Other than noted above, the patient denies any of the following symptoms: Systemic:  No fever, chills, sweats, weight loss, or fatigue. ENT:  No nasal congestion, rhinorrhea, sore throat, swelling of lips, tongue or throat. Resp:  No cough, wheezing, or shortness of breath. Skin:  No rash, itching, nodules, or suspicious lesions.  [place]:  Past medical history, family history, social history, meds, and allergies were reviewed.  Physical Exam:   Vital signs:  BP 184/101  Pulse 71  Temp(Src) 98.5 F (36.9 C) (Oral)  Resp 19  SpO2 99% Gen:  Alert, oriented, in no distress. Skin:  She has an erythematous area on the buttock and a tiny patch of erythema overlying the lateral hip. There are any vesicles yet. Skin is otherwise clear.  Assessment:   Diagnoses that have been ruled out:  None  Diagnoses that are still under consideration:  None  Final diagnoses:  Shingles    Plan:   1.  The following meds were prescribed:   New Prescriptions   ACYCLOVIR (ZOVIRAX) 400 MG TABLET    Take 2 tablets (800 mg total) by mouth every 4 (four) hours while awake.   TRAMADOL (ULTRAM) 50 MG TABLET    Take 2 tablets (100 mg total) by mouth every 8 (eight) hours as needed for pain.   2.  The patient was instructed in symptomatic care and handouts were given. 3.  The patient was told to return if becoming worse in any way, if no better in 3 or 4 days, and given some red flag symptoms that would indicate earlier  return.     Birdena Crandall, MD 03/21/11 [phone]

## 2011-03-21 NOTE — ED Notes (Signed)
Pt was seen here yesterday and seen for painful rt buttock, no rash until last pm at 9 when she got out of the shower and noticed increased sensitivity to the towel. Now red and painful area on rt buttocl extending around to her rt hip.

## 2011-05-09 ENCOUNTER — Encounter (HOSPITAL_COMMUNITY): Payer: Self-pay | Admitting: *Deleted

## 2011-05-09 ENCOUNTER — Encounter (HOSPITAL_COMMUNITY): Payer: Self-pay | Admitting: Emergency Medicine

## 2011-05-09 ENCOUNTER — Emergency Department (HOSPITAL_COMMUNITY)
Admission: EM | Admit: 2011-05-09 | Discharge: 2011-05-09 | Disposition: A | Discharge status: Home / Self Care | Payer: BC Managed Care – PPO | Attending: Emergency Medicine | Admitting: Emergency Medicine

## 2011-05-09 ENCOUNTER — Emergency Department (HOSPITAL_COMMUNITY)
Admission: EM | Admit: 2011-05-09 | Discharge: 2011-05-09 | Disposition: A | Discharge status: Nursing Home (Includes ICF) | Payer: BC Managed Care – PPO | Source: Home / Self Care | Attending: Emergency Medicine | Admitting: Emergency Medicine

## 2011-05-09 DIAGNOSIS — F3289 Other specified depressive episodes: Secondary | ICD-10-CM | POA: Insufficient documentation

## 2011-05-09 DIAGNOSIS — I82409 Acute embolism and thrombosis of unspecified deep veins of unspecified lower extremity: Secondary | ICD-10-CM

## 2011-05-09 DIAGNOSIS — M7989 Other specified soft tissue disorders: Secondary | ICD-10-CM

## 2011-05-09 DIAGNOSIS — M109 Gout, unspecified: Secondary | ICD-10-CM | POA: Insufficient documentation

## 2011-05-09 DIAGNOSIS — E119 Type 2 diabetes mellitus without complications: Secondary | ICD-10-CM | POA: Insufficient documentation

## 2011-05-09 DIAGNOSIS — I10 Essential (primary) hypertension: Secondary | ICD-10-CM | POA: Insufficient documentation

## 2011-05-09 DIAGNOSIS — Z79899 Other long term (current) drug therapy: Secondary | ICD-10-CM | POA: Insufficient documentation

## 2011-05-09 DIAGNOSIS — M79609 Pain in unspecified limb: Secondary | ICD-10-CM | POA: Insufficient documentation

## 2011-05-09 DIAGNOSIS — M25473 Effusion, unspecified ankle: Secondary | ICD-10-CM | POA: Insufficient documentation

## 2011-05-09 DIAGNOSIS — F329 Major depressive disorder, single episode, unspecified: Secondary | ICD-10-CM | POA: Insufficient documentation

## 2011-05-09 DIAGNOSIS — M25476 Effusion, unspecified foot: Secondary | ICD-10-CM | POA: Insufficient documentation

## 2011-05-09 LAB — POCT I-STAT, CHEM 8
BUN: 38 mg/dL — ABNORMAL HIGH (ref 6–23)
Calcium, Ion: 1.22 mmol/L (ref 1.12–1.32)
Chloride: 108 mEq/L (ref 96–112)
Creatinine, Ser: 1.9 mg/dL — ABNORMAL HIGH (ref 0.50–1.10)
Glucose, Bld: 135 mg/dL — ABNORMAL HIGH (ref 70–99)
HCT: 32 % — ABNORMAL LOW (ref 36.0–46.0)
Hemoglobin: 10.9 g/dL — ABNORMAL LOW (ref 12.0–15.0)
Potassium: 4.7 mEq/L (ref 3.5–5.1)
Sodium: 139 mEq/L (ref 135–145)
TCO2: 25 mmol/L (ref 0–100)

## 2011-05-09 LAB — D-DIMER, QUANTITATIVE: D-Dimer, Quant: 0.57 ug/mL-FEU — ABNORMAL HIGH (ref 0.00–0.48)

## 2011-05-09 LAB — URIC ACID: Uric Acid, Serum: 9.1 mg/dL — ABNORMAL HIGH (ref 2.4–7.0)

## 2011-05-09 MED ORDER — PREDNISONE 10 MG PO TABS: 50.0000 mg | ORAL_TABLET | Freq: Every day | ORAL | Status: DC

## 2011-05-09 NOTE — Discharge Instructions (Signed)
We have determined that your problem requires further evaluation in the emergency department.  We will take care of your transport there.  Once at the emergency department, you will be evaluated by a provider and they will order whatever treatment or tests they deem necessary.  We cannot guarantee that they will do any specific test or do any specific treatment.  ° °

## 2011-05-09 NOTE — ED Notes (Addendum)
Pt with c/o pain left foot onset Wednesday - history of gout - pain located posterior foot radiates up leg - pt denies injury - swelling foot ankle - pain when flexing foot

## 2011-05-09 NOTE — ED Notes (Signed)
Pt needs I-stat, chem 8 prior to discharge, awaiting blood draw

## 2011-05-09 NOTE — ED Provider Notes (Signed)
History     CSN: [id number]  Arrival date & time 05/09/11  1113   First MD Initiated Contact with Patient 05/09/11 1129      Chief Complaint  Patient presents with  . Foot Pain    (Consider location/radiation/quality/duration/timing/severity/associated sxs/prior treatment) HPI Comments: Patient was sent here from Urgent Care for evaluation of a possible DVT.  She was found at Urgent Care to have an elevated d-dimer.  She reports that she has had left foot pain for the past 4 days.  Pain located on the left heel.  The pain radiates to the left calf.  Pain worse with dorsiflexion of the left foot.  No injury or trauma.  She has also noted some swelling of the left ankle.  PMH significant for Gout.  Her uric acid level at Urgent Care was also found to be elevated.  She currently takes Allopurinol daily for Gout.  She denies any fever/chills.  She denies any surgeries or prolonged travel in the past 4 weeks.  Denies any swelling of the left calf.  Denies any prior history of DVT or PE.  Denies any estrogen containing medicaion.  She does not smoke.  The history is provided by the patient.    Past Medical History  Diagnosis Date  . Hypertension   . Diabetes mellitus   . Gout   . Depression     History reviewed. No pertinent past surgical history.  Family History  Problem Relation Age of Onset  . Hypertension Mother   . Diabetes Father     History  Substance Use Topics  . Smoking status: Never Smoker   . Smokeless tobacco: Not on file  . Alcohol Use: No    OB History    Grav Para Term Preterm Abortions TAB SAB Ect Mult Living                  Review of Systems  Constitutional: Negative for fever and chills.  Respiratory: Negative for shortness of breath.   Cardiovascular: Negative for chest pain.  Musculoskeletal: Positive for joint swelling. Negative for gait problem.  Skin: Negative for color change.    Allergies  Hydrocodone-acetaminophen and  Tylenol-codeine  Home Medications   Current Outpatient Rx  Name Route Sig Dispense Refill  . ALLOPURINOL 100 MG PO TABS Oral Take 100 mg by mouth daily.    Marland Kitchen AMLODIPINE BESYLATE 5 MG PO TABS Oral Take 5 mg by mouth daily.    . INSULIN GLARGINE 100 UNIT/ML [place] SOLN Subcutaneous Inject 60 Units into the skin at bedtime.    Marland Kitchen LISINOPRIL-HYDROCHLOROTHIAZIDE 20-12.5 MG PO TABS Oral Take 1 tablet by mouth daily.    Marland Kitchen METFORMIN HCL 500 MG PO TABS Oral Take 1,000 mg by mouth 2 (two) times daily with a meal.     . METOPROLOL TARTRATE 100 MG PO TABS Oral Take 100 mg by mouth daily.     . TRAMADOL HCL 50 MG PO TABS Oral Take 50 mg by mouth every 6 (six) hours as needed. For pain.      BP 130/83  Pulse 54  Temp 97.7 F (36.5 C)  Resp 20  SpO2 97%  Physical Exam  Nursing note and vitals reviewed. Constitutional: She appears well-developed and well-nourished. No distress.  HENT:  Head: Normocephalic and atraumatic.  Mouth/Throat: Oropharynx is clear and moist.  Neck: Normal range of motion. Neck supple.  Cardiovascular: Normal rate, regular rhythm and normal heart sounds.   Pulses:  Dorsalis pedis pulses are 2+ on the right side, and 2+ on the left side.  Pulmonary/Chest: Effort normal and breath sounds normal. No respiratory distress. She has no wheezes. She has no rales. She exhibits no tenderness.  Musculoskeletal:       Positive Homan's sign on left No swelling of the calf appreciated Swelling of the left lateral malleolus.  No erythema or warmth.  Neurological: She is alert.  Skin: Skin is warm and dry. She is not diaphoretic.       No erythema of foot or calf visualized.  Psychiatric: She has a normal mood and affect.    ED Course  Procedures (including critical care time)  Labs Reviewed - No data to display No results found.   No diagnosis found.    MDM  Patient comes in with left foot pain radiating to left calf for the past 4 days.  At urgent care she was found to  have both an elevated uric acid level and an elevated d-dimer.  LE venous doppler ultrasound negative for DVT.  Patient therefore treated for Gout.  Patient afebrile.  Due to patient's history of DM and HTN an istat 8 was ordered to determine kidney function.  Creatine elevated at 1.9.  Therefore, patient could not be given Indocin or NSAIDS. Therefore, patient given Rx for Prednisone.  Elevated creatine discussed both with patient and Dr. Roxanne Mins.  Patient instructed to follow up with PCP to have creatine rechecked.          Sherlyn Lees [place], PA-C 05/10/11 2322

## 2011-05-09 NOTE — ED Provider Notes (Signed)
Chief Complaint  Patient presents with  . Foot Pain  . Foot Swelling    History of Present Illness:  The patient is a 46 year old female who has had a four-day history of pain and swelling in the left calf. This began in the Achilles area and the heel and radiates up into the calf. There is swelling, and throbbing. It is worse when she walks, bears weight, is also worse with movement and with touch. She denies any fever, chills, sweats, shortness of breath, chest pain, hemoptysis, or prior history of DVT. She does have a prior history of gout. She's on allopurinol for this. She denies any trauma to the area.  Review of Systems:  Other than noted above, the patient denies any of the following symptoms: Systemic:  No fever, chills, sweats, weight gain or loss. Respiratory:  No coughing, wheezing, or shortness of breath. Cardiac:  No chest pain, tightness, pressure or syncope. GI:  No abdominal pain, swelling, distension, nausea, or vomiting. GU:  No dysuria, frequency, or hematuria. Ext:  No joint pain, muscle pain, or weakness. Skin:  No rash or itching. Neuro:  No paresthesias.   [place]:  Past medical history, family history, social history, meds, and allergies were reviewed.  Physical Exam:   Vital signs:  BP 151/90  Pulse 63  Temp(Src) 99 F (37.2 C) (Oral)  Resp 16  SpO2 100% Gen:  Alert, oriented, in no distress. Neck:  No tenderness, adenopathy, or JVD. Lungs:  Breath sounds clear and equal bilaterally.  No rales, rhonchi or wheezes. Heart:  Regular rhythm, no gallops or murmers. Abdomen:  Soft, nontender, no organomegaly or mass. Ext:  There is pain to palpation over the left calf and a positive Homans sign. There is also pain to palpation of the Achilles tendon. She has slight swelling of the lower leg and some puffiness of the dorsum of the foot. Neuro:  Alert and oriented times 3.  No muscle weakness.  Sensation intact to light touch. Skin:  Warm and dry.  No rash or skin  lesions.  Labs:   Results for orders placed during the hospital encounter of 05/09/11  D-DIMER, QUANTITATIVE      Component Value Range   D-Dimer, Quant 0.57 (*) 0.00 - 0.48 (ug/mL-FEU)  URIC ACID      Component Value Range   Uric Acid, Serum 9.1 (*) 2.4 - 7.0 (mg/dL)     Assessment:  The encounter diagnosis was DVT (deep venous thrombosis).  Plan:   1.  The following meds were prescribed:   New Prescriptions   No medications on file   2.  The patient was transported to the emergency department via shuttle.  Harden Mo, MD 05/09/11 1352

## 2011-05-09 NOTE — Progress Notes (Signed)
VASCULAR LAB PRELIMINARY  PRELIMINARY  PRELIMINARY  PRELIMINARY  Left lower extremity venous Doppler completed.    Preliminary report:  There is no DVT or SVT noted in the left lower extremity.  Brenda Davis [place], 05/09/2011, 12:51 PM

## 2011-05-09 NOTE — ED Notes (Signed)
Pt. Stated. I've had lt. Foot pain since Wed. Went to [facility] sent here because the D-Dimer was elevated

## 2011-05-09 NOTE — Discharge Instructions (Signed)
It is important for you to follow up with your primary care physician regarding your kidney function.  Your creatine, which tells [place] how well your kidneys are functioning, was high today in the Emergency Department.  I recommend having your doctor recheck this level.  Gout Gout is an inflammatory condition (arthritis) caused by a buildup of uric acid crystals in the joints. Uric acid is a chemical that is normally present in the blood. Under some circumstances, uric acid can form into crystals in your joints. This causes joint redness, soreness, and swelling (inflammation). Repeat attacks are common. Over time, uric acid crystals can form into masses (tophi) near a joint, causing disfigurement. Gout is treatable and often preventable. CAUSES  The disease begins with elevated levels of uric acid in the blood. Uric acid is produced by your body when it breaks down a naturally found substance called purines. This also happens when you eat certain foods such as meats and fish. Causes of an elevated uric acid level include:  Being passed down from parent to child (heredity).   Diseases that cause increased uric acid production (obesity, psoriasis, some cancers).   Excessive alcohol use.   Diet, especially diets rich in meat and seafood.   Medicines, including certain cancer-fighting drugs (chemotherapy), diuretics, and aspirin.   Chronic kidney disease. The kidneys are no longer able to remove uric acid well.   Problems with metabolism.  Conditions strongly associated with gout include:  Obesity.   High blood pressure.   High cholesterol.   Diabetes.  Not everyone with elevated uric acid levels gets gout. It is not understood why some people get gout and others do not. Surgery, joint injury, and eating too much of certain foods are some of the factors that can lead to gout. SYMPTOMS   An attack of gout comes on quickly. It causes intense pain with redness, swelling, and warmth in a joint.     Fever can occur.   Often, only one joint is involved. Certain joints are more commonly involved:   Base of the big toe.   Knee.   Ankle.   Wrist.   Finger.  Without treatment, an attack usually goes away in a few days to weeks. Between attacks, you usually will not have symptoms, which is different from many other forms of arthritis. DIAGNOSIS  Your caregiver will suspect gout based on your symptoms and exam. Removal of fluid from the joint (arthrocentesis) is done to check for uric acid crystals. Your caregiver will give you a medicine that numbs the area (local anesthetic) and use a needle to remove joint fluid for exam. Gout is confirmed when uric acid crystals are seen in joint fluid, using a special microscope. Sometimes, blood, urine, and X-ray tests are also used. TREATMENT  There are 2 phases to gout treatment: treating the sudden onset (acute) attack and preventing attacks (prophylaxis). Treatment of an Acute Attack  Medicines are used. These include anti-inflammatory medicines or steroid medicines.   An injection of steroid medicine into the affected joint is sometimes necessary.   The painful joint is rested. Movement can worsen the arthritis.   You may use warm or cold treatments on painful joints, depending which works best for you.   Discuss the use of coffee, vitamin C, or cherries with your caregiver. These may be helpful treatment options.  Treatment to Prevent Attacks After the acute attack subsides, your caregiver may advise prophylactic medicine. These medicines either help your kidneys eliminate uric acid from  your body or decrease your uric acid production. You may need to stay on these medicines for a very long time. The early phase of treatment with prophylactic medicine can be associated with an increase in acute gout attacks. For this reason, during the first few months of treatment, your caregiver may also advise you to take medicines usually used for  acute gout treatment. Be sure you understand your caregiver's directions. You should also discuss dietary treatment with your caregiver. Certain foods such as meats and fish can increase uric acid levels. Other foods such as dairy can decrease levels. Your caregiver can give you a list of foods to avoid. HOME CARE INSTRUCTIONS   Do not take aspirin to relieve pain. This raises uric acid levels.   Only take over-the-counter or prescription medicines for pain, discomfort, or fever as directed by your caregiver.   Rest the joint as much as possible. When in bed, keep sheets and blankets off painful areas.   Keep the affected joint raised (elevated).   Use crutches if the painful joint is in your leg.   Drink enough water and fluids to keep your urine clear or pale yellow. This helps your body get rid of uric acid. Do not drink alcoholic beverages. They slow the passage of uric acid.   Follow your caregiver's dietary instructions. Pay careful attention to the amount of protein you eat. Your daily diet should emphasize fruits, vegetables, whole grains, and fat-free or low-fat milk products.   Maintain a healthy body weight.  SEEK MEDICAL CARE IF:   You have an oral temperature above 102 F (38.9 C).   You develop diarrhea, vomiting, or any side effects from medicines.   You do not feel better in 24 hours, or you are getting worse.  SEEK IMMEDIATE MEDICAL CARE IF:   Your joint becomes suddenly more tender and you have:   Chills.   An oral temperature above 102 F (38.9 C), not controlled by medicine.  MAKE SURE YOU:   Understand these instructions.   Will watch your condition.   Will get help right away if you are not doing well or get worse.  Document Released: 01/03/2000 Document Revised: 12/25/2010 Document Reviewed: 04/15/2009 [facility] Patient Information 2012 [place].

## 2011-05-12 NOTE — ED Provider Notes (Signed)
Medical screening examination/treatment/procedure(s) were performed by non-physician practitioner and as supervising physician I was immediately available for consultation/collaboration.   Delora Fuel, MD 35/70/17 [id number]

## 2011-09-02 ENCOUNTER — Emergency Department (INDEPENDENT_AMBULATORY_CARE_PROVIDER_SITE_OTHER): Payer: BC Managed Care – PPO

## 2011-09-02 ENCOUNTER — Emergency Department (HOSPITAL_COMMUNITY)
Admission: EM | Admit: 2011-09-02 | Discharge: 2011-09-02 | Discharge status: Absent without leave | Payer: BC Managed Care – PPO | Source: Home / Self Care

## 2011-09-02 ENCOUNTER — Encounter (HOSPITAL_COMMUNITY): Payer: Self-pay

## 2011-09-02 ENCOUNTER — Emergency Department (HOSPITAL_COMMUNITY)
Admission: EM | Admit: 2011-09-02 | Discharge: 2011-09-02 | Disposition: A | Discharge status: Home / Self Care | Payer: BC Managed Care – PPO | Source: Home / Self Care | Attending: Emergency Medicine | Admitting: Emergency Medicine

## 2011-09-02 DIAGNOSIS — M79609 Pain in unspecified limb: Secondary | ICD-10-CM

## 2011-09-02 DIAGNOSIS — M79672 Pain in left foot: Secondary | ICD-10-CM

## 2011-09-02 MED ORDER — PREDNISONE 10 MG PO TABS: 20.0000 mg | ORAL_TABLET | Freq: Every day | ORAL | Status: AC

## 2011-09-02 MED ORDER — INDOMETHACIN 25 MG PO CAPS: 25.0000 mg | ORAL_CAPSULE | Freq: Three times a day (TID) | ORAL | Status: AC | PRN

## 2011-09-02 NOTE — ED Notes (Signed)
Prior history of gout; ankle pain that feels like it is going on again since [place]

## 2011-09-02 NOTE — ED Provider Notes (Addendum)
And I or History     CSN: [id number]  Arrival date & time 09/02/11  1859   First MD Initiated Contact with Patient 09/02/11 1909      Chief Complaint  Patient presents with  . Gout    (Consider location/radiation/quality/duration/timing/severity/associated sxs/prior treatment) HPI Comments: Patient presents to urgent care this evening complaining of left ankle pain for the last 3 days. "Feels like I'm getting gout again", patient denies any traumas ,injuries or foot torsion , no skin changes. Patient denies any numbness, tingling sensation or weakness of her left foot. "It really hurts just to walk and to put pressure on it", the last time they prescribed some prednisone it really helped"  The history is provided by the patient.    Past Medical History  Diagnosis Date  . Hypertension   . Diabetes mellitus   . Gout   . Depression   . Gout     History reviewed. No pertinent past surgical history.  Family History  Problem Relation Age of Onset  . Hypertension Mother   . Diabetes Father     History  Substance Use Topics  . Smoking status: Never Smoker   . Smokeless tobacco: Not on file  . Alcohol Use: No    OB History    Grav Para Term Preterm Abortions TAB SAB Ect Mult Living                  Review of Systems  Constitutional: Positive for activity change. Negative for chills and appetite change.  Skin: Negative for wound.    Allergies  Acetaminophen-codeine and Hydrocodone-acetaminophen  Home Medications   Current Outpatient Rx  Name Route Sig Dispense Refill  . ALLOPURINOL 100 MG PO TABS Oral Take 100 mg by mouth daily.    . INSULIN GLARGINE 100 UNIT/ML  SOLN Subcutaneous Inject 60 Units into the skin at bedtime.    Marland Kitchen LISINOPRIL-HYDROCHLOROTHIAZIDE 20-12.5 MG PO TABS Oral Take 1 tablet by mouth daily.    Marland Kitchen METFORMIN HCL 500 MG PO TABS Oral Take 1,000 mg by mouth 2 (two) times daily with a meal.     . METOPROLOL TARTRATE 100 MG PO TABS Oral Take 100  mg by mouth daily.     Marland Kitchen AMLODIPINE BESYLATE 5 MG PO TABS Oral Take 5 mg by mouth daily.    . INDOMETHACIN 25 MG PO CAPS Oral Take 1 capsule (25 mg total) by mouth 3 (three) times daily as needed. 30 capsule 0  . PREDNISONE 10 MG PO TABS Oral Take 5 tablets (50 mg total) by mouth daily. 25 tablet 0  . PREDNISONE 10 MG PO TABS Oral Take 2 tablets (20 mg total) by mouth daily. 15 tablet 0  . TRAMADOL HCL 50 MG PO TABS Oral Take 50 mg by mouth every 6 (six) hours as needed. For pain.      BP 137/85  Pulse 76  Temp 99 F (37.2 C) (Oral)  Resp 18  SpO2 98%  Physical Exam  Nursing note and vitals reviewed. Constitutional: Vital signs are normal. She appears well-developed.  Non-toxic appearance. She does not have a sickly appearance. She does not appear ill. No distress.  HENT:  Head: Normocephalic.  Eyes: Conjunctivae are normal.  Neck: Neck supple.  Abdominal: Soft.  Musculoskeletal: She exhibits tenderness. She exhibits no edema.       Feet:  Neurological: She is alert.  Skin: Skin is warm and dry. No rash noted. No erythema.    ED  Course  Procedures (including critical care time)  Labs Reviewed - No data to display Dg Foot Complete Left  09/02/2011  *RADIOLOGY REPORT*  Clinical Data: Foot pain for 4 days.  History of gout.  LEFT FOOT - COMPLETE 3+ VIEW  Comparison: None.  Findings: There is no evidence of gouty arthritis.  Mild first MTP degenerative disease is noted.  Small calcaneal spurs are also identified, unchanged.  IMPRESSION: No acute finding.  Stable compared to prior exam.  Original Report Authenticated By: Arvid Right. D'ALESSIO, M.D.     1. Foot pain, left       MDM   Left foot pain localized soft tissue swelling. Treat patient with course of prednisone and indomethacin. She has a history of gout and identifies this as current crisis as such. Given the localized swelling and tenderness of the fifth metatarsal area sided to rule out a spontaneous fifth metatarsal  fraccture       Rosana Hoes, MD 09/02/11 1943  Rosana Hoes, MD 09/02/11 2239

## 2011-10-21 ENCOUNTER — Other Ambulatory Visit (INDEPENDENT_AMBULATORY_CARE_PROVIDER_SITE_OTHER): Payer: BC Managed Care – PPO

## 2011-10-21 ENCOUNTER — Encounter: Payer: Self-pay | Admitting: Internal Medicine

## 2011-10-21 ENCOUNTER — Ambulatory Visit (INDEPENDENT_AMBULATORY_CARE_PROVIDER_SITE_OTHER): Payer: BC Managed Care – PPO | Admitting: Internal Medicine

## 2011-10-21 VITALS — BP 160/110 | HR 83 | Temp 98.3°F | Resp 16 | Ht 64.0 in | Wt 296.8 lb

## 2011-10-21 DIAGNOSIS — E7800 Pure hypercholesterolemia, unspecified: Secondary | ICD-10-CM

## 2011-10-21 DIAGNOSIS — N259 Disorder resulting from impaired renal tubular function, unspecified: Secondary | ICD-10-CM

## 2011-10-21 DIAGNOSIS — E1129 Type 2 diabetes mellitus with other diabetic kidney complication: Secondary | ICD-10-CM

## 2011-10-21 DIAGNOSIS — I10 Essential (primary) hypertension: Secondary | ICD-10-CM

## 2011-10-21 DIAGNOSIS — N058 Unspecified nephritic syndrome with other morphologic changes: Secondary | ICD-10-CM

## 2011-10-21 DIAGNOSIS — R809 Proteinuria, unspecified: Secondary | ICD-10-CM

## 2011-10-21 DIAGNOSIS — Z23 Encounter for immunization: Secondary | ICD-10-CM

## 2011-10-21 DIAGNOSIS — E1165 Type 2 diabetes mellitus with hyperglycemia: Secondary | ICD-10-CM

## 2011-10-21 LAB — CBC WITH DIFFERENTIAL/PLATELET
Basophils Absolute: 0 10*3/uL (ref 0.0–0.1)
Basophils Relative: 0.3 % (ref 0.0–3.0)
Eosinophils Absolute: 0.2 10*3/uL (ref 0.0–0.7)
Eosinophils Relative: 2.2 % (ref 0.0–5.0)
HCT: 34.8 % — ABNORMAL LOW (ref 36.0–46.0)
Hemoglobin: 11.2 g/dL — ABNORMAL LOW (ref 12.0–15.0)
Lymphocytes Relative: 32.9 % (ref 12.0–46.0)
Lymphs Abs: 2.3 10*3/uL (ref 0.7–4.0)
MCHC: 32.2 g/dL (ref 30.0–36.0)
MCV: 88.8 fl (ref 78.0–100.0)
Monocytes Absolute: 0.6 10*3/uL (ref 0.1–1.0)
Monocytes Relative: 8.6 % (ref 3.0–12.0)
Neutro Abs: 3.9 10*3/uL (ref 1.4–7.7)
Neutrophils Relative %: 56 % (ref 43.0–77.0)
Platelets: 326 10*3/uL (ref 150.0–400.0)
RBC: 3.91 Mil/uL (ref 3.87–5.11)
RDW: 14.4 % (ref 11.5–14.6)
WBC: 7 10*3/uL (ref 4.5–10.5)

## 2011-10-21 LAB — LIPID PANEL
Cholesterol: 255 mg/dL — ABNORMAL HIGH (ref 0–200)
HDL: 43 mg/dL (ref >39.00)
Total CHOL/HDL Ratio: 6
Triglycerides: 129 mg/dL (ref 0.0–149.0)
VLDL: 25.8 mg/dL (ref 0.0–40.0)

## 2011-10-21 LAB — URINALYSIS, ROUTINE W REFLEX MICROSCOPIC
Bilirubin Urine: NEGATIVE
Ketones, ur: NEGATIVE
Leukocytes, UA: NEGATIVE
Nitrite: NEGATIVE
Specific Gravity, Urine: 1.02 (ref 1.000–1.030)
Total Protein, Urine: 100
Urine Glucose: NEGATIVE
Urobilinogen, UA: 0.2 (ref 0.0–1.0)
pH: 6 (ref 5.0–8.0)

## 2011-10-21 LAB — COMPREHENSIVE METABOLIC PANEL
ALT: 17 U/L (ref 0–35)
AST: 18 U/L (ref 0–37)
Albumin: 3.1 g/dL — ABNORMAL LOW (ref 3.5–5.2)
Alkaline Phosphatase: 63 U/L (ref 39–117)
BUN: 37 mg/dL — ABNORMAL HIGH (ref 6–23)
CO2: 25 mEq/L (ref 19–32)
Calcium: 9.1 mg/dL (ref 8.4–10.5)
Chloride: 106 mEq/L (ref 96–112)
Creatinine, Ser: 1.9 mg/dL — ABNORMAL HIGH (ref 0.4–1.2)
GFR: 35.81 mL/min — ABNORMAL LOW (ref >60.00)
Glucose, Bld: 115 mg/dL — ABNORMAL HIGH (ref 70–99)
Potassium: 4.7 mEq/L (ref 3.5–5.1)
Sodium: 140 mEq/L (ref 135–145)
Total Bilirubin: 0.4 mg/dL (ref 0.3–1.2)
Total Protein: 6.8 g/dL (ref 6.0–8.3)

## 2011-10-21 LAB — LDL CHOLESTEROL, DIRECT: Direct LDL: 183.6 mg/dL

## 2011-10-21 LAB — TSH: TSH: 1.71 u[IU]/mL (ref 0.35–5.50)

## 2011-10-21 LAB — HEMOGLOBIN A1C: Hgb A1c MFr Bld: 8.6 % — ABNORMAL HIGH (ref 4.6–6.5)

## 2011-10-21 MED ORDER — OLMESARTAN-AMLODIPINE-HCTZ 40-10-25 MG PO TABS: 1.0000 | ORAL_TABLET | Freq: Every day | ORAL | Status: DC

## 2011-10-21 MED ORDER — ROSUVASTATIN CALCIUM 20 MG PO TABS: 20.0000 mg | ORAL_TABLET | Freq: Every day | ORAL | Status: DC

## 2011-10-21 NOTE — Patient Instructions (Signed)

## 2011-10-21 NOTE — Progress Notes (Signed)
Subjective:    Patient ID: Brenda Davis, female    DOB: 04/22/1965, 46 y.o.   MRN: [id number]  Diabetes She presents for her follow-up diabetic visit. She has type 2 diabetes mellitus. There are no hypoglycemic associated symptoms. Pertinent negatives for hypoglycemia include no pallor. Associated symptoms include fatigue and polyphagia. Pertinent negatives for diabetes include no blurred vision, no chest pain, no foot paresthesias, no foot ulcerations, no polydipsia, no polyuria, no visual change, no weakness and no weight loss. There are no hypoglycemic complications. Symptoms are stable. Diabetic complications include nephropathy. Current diabetic treatment includes insulin injections and oral agent (monotherapy). She is compliant with treatment all of the time. Her weight is increasing steadily. She is following a generally healthy diet. Meal planning includes avoidance of concentrated sweets. She has not had a previous visit with a dietician. She participates in exercise intermittently. There is no change in her home blood glucose trend. An ACE inhibitor/angiotensin II receptor blocker is being taken. She does not see a podiatrist.Eye exam is not current.      Review of Systems  Constitutional: Positive for fatigue and unexpected weight change (weight gain). Negative for fever, chills, weight loss, diaphoresis, activity change and appetite change.  HENT: Negative.   Eyes: Negative.  Negative for blurred vision.  Respiratory: Negative for cough, chest tightness, shortness of breath, wheezing and stridor.   Cardiovascular: Negative for chest pain, palpitations and leg swelling.  Gastrointestinal: Negative for nausea, abdominal pain, diarrhea, constipation and blood in stool.  Genitourinary: Negative.  Negative for polyuria.  Musculoskeletal: Negative for myalgias, back pain, joint swelling, arthralgias and gait problem.  Skin: Negative for color change, pallor, rash and wound.    Neurological: Negative.  Negative for weakness.  Hematological: Positive for polyphagia. Negative for polydipsia and adenopathy. Does not bruise/bleed easily.  Psychiatric/Behavioral: Negative.        Objective:   Physical Exam  Vitals reviewed. Constitutional: She is oriented to person, place, and time. She appears well-developed and well-nourished. No distress.  HENT:  Head: Normocephalic and atraumatic.  Nose: Nose normal.  Mouth/Throat: Oropharynx is clear and moist.  Eyes: Conjunctivae normal are normal. Right eye exhibits no discharge. Left eye exhibits no discharge. No scleral icterus.  Neck: Normal range of motion. Neck supple. No JVD present. No tracheal deviation present. No thyromegaly present.  Cardiovascular: Normal rate, regular rhythm, normal heart sounds and intact distal pulses.  Exam reveals no gallop and no friction rub.   No murmur heard. Pulmonary/Chest: Effort normal and breath sounds normal. No stridor. No respiratory distress. She has no wheezes. She has no rales. She exhibits no tenderness.  Abdominal: Soft. Bowel sounds are normal. She exhibits no distension and no mass. There is no tenderness. There is no rebound and no guarding.  Musculoskeletal: Normal range of motion. She exhibits edema (trace edema in BLE). She exhibits no tenderness.  Lymphadenopathy:    She has no cervical adenopathy.  Neurological: She is oriented to person, place, and time.  Skin: Skin is warm and dry. No rash noted. She is not diaphoretic. No erythema. No pallor.  Psychiatric: She has a normal mood and affect. Her behavior is normal. Judgment and thought content normal.      Lab Results  Component Value Date   WBC 6.4 09/17/2009   HGB 10.9* 05/09/2011   HCT 32.0* 05/09/2011   PLT 336 09/17/2009   GLUCOSE 135* 05/09/2011   CHOL 248* 09/17/2009   TRIG 137 09/17/2009   HDL 58 09/17/2009  LDLCALC 163* 09/17/2009   ALT 19 09/17/2009   AST 17 09/17/2009   NA 139 05/09/2011   K 4.7  05/09/2011   CL 108 05/09/2011   CREATININE 1.90* 05/09/2011   BUN 38* 05/09/2011   CO2 26 09/17/2009   TSH 1.834 09/17/2009   HGBA1C 9.3* 09/17/2009   MICROALBUR 159.00* 09/17/2009      Assessment & Plan:

## 2011-10-22 NOTE — Assessment & Plan Note (Signed)
She has stable mild diabetic nephropathy

## 2011-10-22 NOTE — Assessment & Plan Note (Signed)
Her creatinine=1.9 therefore I am concerned that she may develop metabolic acidosis if she stays on metformin, I have asked her to stop metformin and I will like for her to start victoza

## 2011-10-22 NOTE — Assessment & Plan Note (Signed)
Her BP is not well controlled, I will check her labs today to look for end organ damage and secondary causes and have asked her to start tribenzor for better BP control

## 2011-10-22 NOTE — Assessment & Plan Note (Signed)
Start crestor 

## 2011-10-22 NOTE — Assessment & Plan Note (Signed)
This is mild and stable

## 2011-10-26 ENCOUNTER — Telehealth: Payer: Self-pay | Admitting: Internal Medicine

## 2011-10-26 NOTE — Telephone Encounter (Signed)
The pt called and is hoping to get another medication for her diabetes.  She states she has stopped taking the metformin, so she is only doing one insulin shot at night.    Thanks!

## 2011-10-26 NOTE — Telephone Encounter (Signed)
Will she come in and get a sample of victoza?

## 2011-10-27 ENCOUNTER — Telehealth: Payer: Self-pay | Admitting: *Deleted

## 2011-10-27 ENCOUNTER — Other Ambulatory Visit: Payer: Self-pay | Admitting: *Deleted

## 2011-10-27 NOTE — Telephone Encounter (Signed)
[facility] for pt to come by and pick up samples per MD

## 2011-10-27 NOTE — Telephone Encounter (Signed)
Pt came in and picked up Victoza samples. How should she use it?

## 2011-10-28 NOTE — Telephone Encounter (Signed)
Inject 0.6 for one week then 1.2 for one week then inject 1.8 thereafter

## 2011-10-28 NOTE — Telephone Encounter (Signed)
Patienr notified

## 2011-11-12 ENCOUNTER — Telehealth: Payer: Self-pay

## 2011-11-12 MED ORDER — INSULIN GLARGINE 100 UNIT/ML ~~LOC~~ SOLN: SUBCUTANEOUS | Status: DC

## 2011-11-12 NOTE — Telephone Encounter (Signed)
Patient called requesing pens vs vials

## 2011-11-13 ENCOUNTER — Other Ambulatory Visit: Payer: Self-pay

## 2011-11-13 MED ORDER — INSULIN GLARGINE 100 UNIT/ML ~~LOC~~ SOLN: SUBCUTANEOUS | Status: DC

## 2011-12-07 ENCOUNTER — Encounter: Payer: Self-pay | Admitting: Internal Medicine

## 2011-12-07 ENCOUNTER — Other Ambulatory Visit (INDEPENDENT_AMBULATORY_CARE_PROVIDER_SITE_OTHER): Payer: BC Managed Care – PPO

## 2011-12-07 ENCOUNTER — Ambulatory Visit (INDEPENDENT_AMBULATORY_CARE_PROVIDER_SITE_OTHER): Payer: BC Managed Care – PPO | Admitting: Internal Medicine

## 2011-12-07 VITALS — BP 150/90 | HR 94 | Temp 98.2°F | Resp 16 | Ht 64.0 in | Wt 301.4 lb

## 2011-12-07 DIAGNOSIS — E1165 Type 2 diabetes mellitus with hyperglycemia: Secondary | ICD-10-CM

## 2011-12-07 DIAGNOSIS — E1129 Type 2 diabetes mellitus with other diabetic kidney complication: Secondary | ICD-10-CM

## 2011-12-07 DIAGNOSIS — I10 Essential (primary) hypertension: Secondary | ICD-10-CM

## 2011-12-07 DIAGNOSIS — N259 Disorder resulting from impaired renal tubular function, unspecified: Secondary | ICD-10-CM

## 2011-12-07 DIAGNOSIS — R809 Proteinuria, unspecified: Secondary | ICD-10-CM

## 2011-12-07 DIAGNOSIS — R609 Edema, unspecified: Secondary | ICD-10-CM

## 2011-12-07 LAB — BASIC METABOLIC PANEL
BUN: 50 mg/dL — ABNORMAL HIGH (ref 6–23)
CO2: 25 mEq/L (ref 19–32)
Calcium: 9.3 mg/dL (ref 8.4–10.5)
Chloride: 105 mEq/L (ref 96–112)
Creatinine, Ser: 2.3 mg/dL — ABNORMAL HIGH (ref 0.4–1.2)
GFR: 28.66 mL/min — ABNORMAL LOW (ref >60.00)
Glucose, Bld: 223 mg/dL — ABNORMAL HIGH (ref 70–99)
Potassium: 5.4 mEq/L — ABNORMAL HIGH (ref 3.5–5.1)
Sodium: 137 mEq/L (ref 135–145)

## 2011-12-07 LAB — BRAIN NATRIURETIC PEPTIDE: Pro B Natriuretic peptide (BNP): 10 pg/mL (ref 0.0–100.0)

## 2011-12-07 MED ORDER — NEBIVOLOL HCL 5 MG PO TABS: 5.0000 mg | ORAL_TABLET | Freq: Every day | ORAL | Status: DC

## 2011-12-07 MED ORDER — OLMESARTAN MEDOXOMIL-HCTZ 40-25 MG PO TABS: 1.0000 | ORAL_TABLET | Freq: Every day | ORAL | Status: DC

## 2011-12-07 NOTE — Assessment & Plan Note (Addendum)
I will recheck her renal function today and have asked her to get an u/s done

## 2011-12-07 NOTE — Assessment & Plan Note (Signed)
Her BP is improving but she has developed pitting edema which I think is due to the CCB therapy so I have stopped amlodipine and asked her to start benicar-hct and bystolic

## 2011-12-07 NOTE — Assessment & Plan Note (Signed)
Her EKG is normal, I will stop the CCB and will check a BNP today to see if she has CHF and will recheck her renal function as well

## 2011-12-07 NOTE — Patient Instructions (Signed)
Edema Edema is an abnormal build-up of fluids in tissues. Because this is partly dependent on gravity (water flows to the lowest place), it is more common in the legs and thighs (lower extremities). It is also common in the looser tissues, like around the eyes. Painless swelling of the feet and ankles is common and increases as a person ages. It may affect both legs and may include the calves or even thighs. When squeezed, the fluid may move out of the affected area and may leave a dent for a few moments. CAUSES   Prolonged standing or sitting in one place for extended periods of time. Movement helps pump tissue fluid into the veins, and absence of movement prevents this, resulting in edema.  Varicose veins. The valves in the veins do not work as well as they should. This causes fluid to leak into the tissues.  Fluid and salt overload.  Injury, burn, or surgery to the leg, ankle, or foot, may damage veins and allow fluid to leak out.  Sunburn damages vessels. Leaky vessels allow fluid to go out into the sunburned tissues.  Allergies (from insect bites or stings, medications or chemicals) cause swelling by allowing vessels to become leaky.  Protein in the blood helps keep fluid in your vessels. Low protein, as in malnutrition, allows fluid to leak out.  Hormonal changes, including pregnancy and menstruation, cause fluid retention. This fluid may leak out of vessels and cause edema.  Medications that cause fluid retention. Examples are sex hormones, blood pressure medications, steroid treatment, or anti-depressants.  Some illnesses cause edema, especially heart failure, kidney disease, or liver disease.  Surgery that cuts veins or lymph nodes, such as surgery done for the heart or for breast cancer, may result in edema. DIAGNOSIS  Your caregiver is usually easily able to determine what is causing your swelling (edema) by simply asking what is wrong (getting a history) and examining you (doing  a physical). Sometimes x-rays, EKG (electrocardiogram or heart tracing), and blood work may be done to evaluate for underlying medical illness. TREATMENT  General treatment includes:  Leg elevation (or elevation of the affected body part).  Restriction of fluid intake.  Prevention of fluid overload.  Compression of the affected body part. Compression with elastic bandages or support stockings squeezes the tissues, preventing fluid from entering and forcing it back into the blood vessels.  Diuretics (also called water pills or fluid pills) pull fluid out of your body in the form of increased urination. These are effective in reducing the swelling, but can have side effects and must be used only under your caregiver's supervision. Diuretics are appropriate only for some types of edema. The specific treatment can be directed at any underlying causes discovered. Heart, liver, or kidney disease should be treated appropriately. HOME CARE INSTRUCTIONS   Elevate the legs (or affected body part) above the level of the heart, while lying down.  Avoid sitting or standing still for prolonged periods of time.  Avoid putting anything directly under the knees when lying down, and do not wear constricting clothing or garters on the upper legs.  Exercising the legs causes the fluid to work back into the veins and lymphatic channels. This may help the swelling go down.  The pressure applied by elastic bandages or support stockings can help reduce ankle swelling.  A low-salt diet may help reduce fluid retention and decrease the ankle swelling.  Take any medications exactly as prescribed. SEEK MEDICAL CARE IF:  Your edema is   not responding to recommended treatments. SEEK IMMEDIATE MEDICAL CARE IF:   You develop shortness of breath or chest pain.  You cannot breathe when you lay down; or if, while lying down, you have to get up and go to the window to get your breath.  You are having increasing  swelling without relief from treatment.  You develop a fever over 102 F (38.9 C).  You develop pain or redness in the areas that are swollen.  Tell your caregiver right away if you have gained 3 lb/1.4 kg in 1 day or 5 lb/2.3 kg in a week. MAKE SURE YOU:   Understand these instructions.  Will watch your condition.  Will get help right away if you are not doing well or get worse. Document Released: 01/05/2005 Document Revised: 07/07/2011 Document Reviewed: 08/24/2007 [employer] Patient Information 2013 [employer].  

## 2011-12-07 NOTE — Progress Notes (Signed)
Subjective:    Patient ID: Brenda Davis, female    DOB: 1965-08-07, 46 y.o.   MRN: [id number]  Hypertension This is a chronic problem. The current episode started more than 1 year ago. The problem has been gradually improving since onset. The problem is controlled. Associated symptoms include peripheral edema. Pertinent negatives include no anxiety, blurred vision, chest pain, headaches, malaise/fatigue, neck pain, orthopnea, palpitations, PND, shortness of breath or sweats. Past treatments include diuretics, calcium channel blockers and angiotensin blockers. The current treatment provides moderate improvement. Compliance problems include exercise, diet and medication side effects.  Hypertensive end-organ damage includes kidney disease.      Review of Systems  Constitutional: Negative for fever, chills, malaise/fatigue, diaphoresis, activity change, appetite change, fatigue and unexpected weight change.  HENT: Negative.  Negative for neck pain.   Eyes: Negative.  Negative for blurred vision.  Respiratory: Negative for apnea, cough, chest tightness, shortness of breath, wheezing and stridor.   Cardiovascular: Positive for leg swelling. Negative for chest pain, palpitations, orthopnea and PND.  Gastrointestinal: Negative for nausea, vomiting, abdominal pain, diarrhea, constipation, blood in stool and anal bleeding.  Genitourinary: Negative.   Musculoskeletal: Negative.  Negative for myalgias, back pain, joint swelling, arthralgias and gait problem.  Skin: Negative for color change, pallor, rash and wound.  Neurological: Negative for dizziness, tremors, seizures, syncope, facial asymmetry, speech difficulty, weakness, light-headedness, numbness and headaches.  Hematological: Negative for adenopathy. Does not bruise/bleed easily.  Psychiatric/Behavioral: Negative.        Objective:   Physical Exam  Vitals reviewed. Constitutional: She is oriented to person, place, and time. She appears  well-developed and well-nourished. No distress.  HENT:  Head: Normocephalic and atraumatic.  Mouth/Throat: Oropharynx is clear and moist. No oropharyngeal exudate.  Eyes: Conjunctivae normal are normal. Right eye exhibits no discharge. Left eye exhibits no discharge. No scleral icterus.  Neck: Normal range of motion. Neck supple. No JVD present. No tracheal deviation present. No thyromegaly present.  Cardiovascular: Normal rate, normal heart sounds and intact distal pulses.  Exam reveals no gallop and no friction rub.   No murmur heard. Pulmonary/Chest: Effort normal and breath sounds normal. No stridor. No respiratory distress. She has no wheezes. She has no rales. She exhibits no tenderness.  Abdominal: Soft. Bowel sounds are normal. She exhibits no distension and no mass. There is no tenderness. There is no rebound and no guarding.  Musculoskeletal: Normal range of motion. She exhibits edema (2+ pitting edema in BLE). She exhibits no tenderness.  Lymphadenopathy:    She has no cervical adenopathy.  Neurological: She is oriented to person, place, and time.  Skin: Skin is warm and dry. No rash noted. She is not diaphoretic. No erythema. No pallor.  Psychiatric: She has a normal mood and affect. Her behavior is normal. Judgment and thought content normal.      Lab Results  Component Value Date   WBC 7.0 10/21/2011   HGB 11.2* 10/21/2011   HCT 34.8* 10/21/2011   PLT 326.0 10/21/2011   GLUCOSE 115* 10/21/2011   CHOL 255* 10/21/2011   TRIG 129.0 10/21/2011   HDL 43.00 10/21/2011   LDLDIRECT 183.6 10/21/2011   LDLCALC 163* 09/17/2009   ALT 17 10/21/2011   AST 18 10/21/2011   NA 140 10/21/2011   K 4.7 10/21/2011   CL 106 10/21/2011   CREATININE 1.9* 10/21/2011   BUN 37* 10/21/2011   CO2 25 10/21/2011   TSH 1.71 10/21/2011   HGBA1C 8.6* 10/21/2011   MICROALBUR 159.00*  09/17/2009      Assessment & Plan:

## 2011-12-07 NOTE — Addendum Note (Signed)
Addended by: [name] on: 12/07/2011 05:59 PM   Modules accepted: Orders

## 2011-12-08 ENCOUNTER — Encounter: Payer: Self-pay | Admitting: Internal Medicine

## 2011-12-08 ENCOUNTER — Telehealth: Payer: Self-pay

## 2011-12-08 DIAGNOSIS — N179 Acute kidney failure, unspecified: Secondary | ICD-10-CM

## 2011-12-08 NOTE — Telephone Encounter (Signed)
Per Sherri with [facility], need corrected order for renal U/S

## 2011-12-16 ENCOUNTER — Ambulatory Visit
Admission: RE | Admit: 2011-12-16 | Discharge: 2011-12-16 | Disposition: A | Discharge status: Home / Self Care | Payer: BC Managed Care – PPO | Source: Ambulatory Visit | Attending: Internal Medicine | Admitting: Internal Medicine

## 2011-12-16 DIAGNOSIS — N189 Chronic kidney disease, unspecified: Secondary | ICD-10-CM

## 2011-12-16 DIAGNOSIS — N179 Acute kidney failure, unspecified: Secondary | ICD-10-CM

## 2011-12-24 ENCOUNTER — Other Ambulatory Visit: Payer: Self-pay

## 2011-12-30 ENCOUNTER — Other Ambulatory Visit (INDEPENDENT_AMBULATORY_CARE_PROVIDER_SITE_OTHER): Payer: BC Managed Care – PPO

## 2011-12-30 ENCOUNTER — Encounter: Payer: Self-pay | Admitting: Internal Medicine

## 2011-12-30 ENCOUNTER — Ambulatory Visit (INDEPENDENT_AMBULATORY_CARE_PROVIDER_SITE_OTHER): Payer: BC Managed Care – PPO | Admitting: Internal Medicine

## 2011-12-30 VITALS — BP 120/86 | HR 60 | Temp 98.7°F | Resp 16 | Wt 300.0 lb

## 2011-12-30 DIAGNOSIS — I10 Essential (primary) hypertension: Secondary | ICD-10-CM

## 2011-12-30 DIAGNOSIS — R809 Proteinuria, unspecified: Secondary | ICD-10-CM

## 2011-12-30 DIAGNOSIS — N179 Acute kidney failure, unspecified: Secondary | ICD-10-CM

## 2011-12-30 DIAGNOSIS — R609 Edema, unspecified: Secondary | ICD-10-CM

## 2011-12-30 DIAGNOSIS — N189 Chronic kidney disease, unspecified: Secondary | ICD-10-CM

## 2011-12-30 DIAGNOSIS — E1129 Type 2 diabetes mellitus with other diabetic kidney complication: Secondary | ICD-10-CM

## 2011-12-30 DIAGNOSIS — E7800 Pure hypercholesterolemia, unspecified: Secondary | ICD-10-CM

## 2011-12-30 LAB — BASIC METABOLIC PANEL
BUN: 42 mg/dL — ABNORMAL HIGH (ref 6–23)
CO2: 26 mEq/L (ref 19–32)
Calcium: 9.2 mg/dL (ref 8.4–10.5)
Chloride: 108 mEq/L (ref 96–112)
Creatinine, Ser: 2.2 mg/dL — ABNORMAL HIGH (ref 0.4–1.2)
GFR: 30.93 mL/min — ABNORMAL LOW (ref >60.00)
Glucose, Bld: 135 mg/dL — ABNORMAL HIGH (ref 70–99)
Potassium: 5 mEq/L (ref 3.5–5.1)
Sodium: 142 mEq/L (ref 135–145)

## 2011-12-30 LAB — URINALYSIS, ROUTINE W REFLEX MICROSCOPIC
Bilirubin Urine: NEGATIVE
Ketones, ur: NEGATIVE
Leukocytes, UA: NEGATIVE
Nitrite: NEGATIVE
Specific Gravity, Urine: 1.025 (ref 1.000–1.030)
Total Protein, Urine: >=300
Urine Glucose: 100
Urobilinogen, UA: 0.2 (ref 0.0–1.0)
pH: 6 (ref 5.0–8.0)

## 2011-12-30 MED ORDER — NEBIVOLOL HCL 5 MG PO TABS: 5.0000 mg | ORAL_TABLET | Freq: Every day | ORAL | Status: DC

## 2011-12-30 MED ORDER — ROSUVASTATIN CALCIUM 20 MG PO TABS: 20.0000 mg | ORAL_TABLET | Freq: Every day | ORAL | Status: DC

## 2011-12-30 MED ORDER — LIRAGLUTIDE 18 MG/3ML ~~LOC~~ SOLN: 1.8000 mg | Freq: Every day | SUBCUTANEOUS | Status: DC

## 2011-12-30 MED ORDER — OLMESARTAN MEDOXOMIL-HCTZ 40-25 MG PO TABS: 1.0000 | ORAL_TABLET | Freq: Every day | ORAL | Status: DC

## 2011-12-30 NOTE — Assessment & Plan Note (Signed)
She is doing well on the current regimen

## 2011-12-30 NOTE — Assessment & Plan Note (Signed)
Nephrology referral Recheck renal function and lytes today

## 2011-12-30 NOTE — Progress Notes (Signed)
Subjective:    Patient ID: Brenda Davis, female    DOB: [birth year], 46 y.o.   MRN: [id number]  Hypertension This is a chronic problem. The current episode started more than 1 year ago. The problem has been gradually improving since onset. The problem is controlled. Associated symptoms include peripheral edema. Pertinent negatives include no anxiety, blurred vision, chest pain, headaches, malaise/fatigue, neck pain, orthopnea, palpitations, PND, shortness of breath or sweats. There are no associated agents to hypertension. Past treatments include angiotensin blockers, beta blockers and diuretics. Compliance problems include exercise and diet.  Hypertensive end-organ damage includes kidney disease. Identifiable causes of hypertension include chronic renal disease.      Review of Systems  Constitutional: Negative for fever, chills, malaise/fatigue, diaphoresis, activity change, appetite change, fatigue and unexpected weight change.  HENT: Negative.  Negative for neck pain.   Eyes: Negative.  Negative for blurred vision.  Respiratory: Negative for apnea, cough, choking, chest tightness, shortness of breath, wheezing and stridor.   Cardiovascular: Negative for chest pain, palpitations, orthopnea and PND.  Gastrointestinal: Negative for nausea, vomiting, abdominal pain, diarrhea and blood in stool.  Genitourinary: Negative for dysuria, urgency, frequency, hematuria, flank pain, enuresis, difficulty urinating and dyspareunia.  Musculoskeletal: Negative for myalgias, back pain, joint swelling, arthralgias and gait problem.  Skin: Negative for color change, pallor, rash and wound.  Neurological: Negative for dizziness, tremors, seizures, syncope, facial asymmetry, speech difficulty, weakness, light-headedness, numbness and headaches.  Hematological: Negative for adenopathy. Does not bruise/bleed easily.  Psychiatric/Behavioral: Negative.        Objective:   Physical Exam  Vitals  reviewed. Constitutional: She is oriented to person, place, and time. She appears well-developed and well-nourished. No distress.  HENT:  Head: Normocephalic and atraumatic.  Mouth/Throat: Oropharynx is clear and moist. No oropharyngeal exudate.  Eyes: Conjunctivae normal are normal. Right eye exhibits no discharge. Left eye exhibits no discharge. No scleral icterus.  Neck: Normal range of motion. Neck supple. No JVD present. No tracheal deviation present. No thyromegaly present.  Cardiovascular: Normal rate, regular rhythm, normal heart sounds and intact distal pulses.  Exam reveals no gallop and no friction rub.   No murmur heard. Pulmonary/Chest: Effort normal and breath sounds normal. No stridor. No respiratory distress. She has no wheezes. She has no rales. She exhibits no tenderness.  Abdominal: Soft. Bowel sounds are normal. She exhibits no distension and no mass. There is no tenderness. There is no rebound and no guarding.  Musculoskeletal: Normal range of motion. She exhibits edema (1+ edema in BLE). She exhibits no tenderness.  Lymphadenopathy:    She has no cervical adenopathy.  Neurological: She is oriented to person, place, and time.  Skin: Skin is warm and dry. No rash noted. She is not diaphoretic. No erythema. No pallor.  Psychiatric: She has a normal mood and affect. Her behavior is normal. Judgment and thought content normal.      Lab Results  Component Value Date   WBC 7.0 10/21/2011   HGB 11.2* 10/21/2011   HCT 34.8* 10/21/2011   PLT 326.0 10/21/2011   GLUCOSE 223* 12/07/2011   CHOL 255* 10/21/2011   TRIG 129.0 10/21/2011   HDL 43.00 10/21/2011   LDLDIRECT 183.6 10/21/2011   LDLCALC 163* 09/17/2009   ALT 17 10/21/2011   AST 18 10/21/2011   NA 137 12/07/2011   K 5.4* 12/07/2011   CL 105 12/07/2011   CREATININE 2.3* 12/07/2011   BUN 50* 12/07/2011   CO2 25 12/07/2011   TSH 1.71 10/21/2011  HGBA1C 8.6* 10/21/2011   MICROALBUR 159.00* 09/17/2009     US  Renal  12/16/2011  *RADIOLOGY REPORT*  Clinical Data: Acute renal failure.  Diabetes.  RENAL/URINARY TRACT ULTRASOUND COMPLETE  Comparison:  None.  Findings:  Right Kidney:  Measures 10.7 cm in length, with normal echogenicity and echotexture.  No renal stones, masses, or hydronephrosis.  Left Kidney:  Measures 11.6 cm in length, with normal echogenicity and echo texture.  Suspected dromedary hump, with mild retained fetal lobularity.  Bladder:  Small caliber likely from recent emptying.  No mass observed.  IMPRESSION:  1.  Essentially normal sonographic appearance the kidneys. Suspected left dromedary hump and mild retained fetal lobulation on the left.   Original Report Authenticated By: Van Clines, M.D.   Assessment & Plan:

## 2011-12-30 NOTE — Patient Instructions (Signed)

## 2011-12-30 NOTE — Assessment & Plan Note (Signed)
This has improved.

## 2011-12-30 NOTE — Assessment & Plan Note (Signed)
Her BP is well controlled Continue the current 3 drug regimen

## 2011-12-30 NOTE — Assessment & Plan Note (Signed)
Recheck the UA today I am concerned about "nephrotic syndrome" so have referred her to urology

## 2012-02-23 ENCOUNTER — Telehealth: Payer: Self-pay

## 2012-02-23 DIAGNOSIS — E669 Obesity, unspecified: Secondary | ICD-10-CM

## 2012-02-23 NOTE — Telephone Encounter (Signed)
Patient notified

## 2012-02-23 NOTE — Telephone Encounter (Signed)
Patient called [facility] stating that she was seen at [facility] (Dr. Florene Glen) who advised that bariatric surgery is medically nessecary and that she should contact her PCP to refer. Thanks

## 2012-02-23 NOTE — Telephone Encounter (Signed)
Referral done

## 2012-03-11 ENCOUNTER — Encounter (INDEPENDENT_AMBULATORY_CARE_PROVIDER_SITE_OTHER): Payer: Self-pay | Admitting: General Surgery

## 2012-03-11 ENCOUNTER — Ambulatory Visit (INDEPENDENT_AMBULATORY_CARE_PROVIDER_SITE_OTHER): Payer: BC Managed Care – PPO | Admitting: General Surgery

## 2012-03-11 DIAGNOSIS — I10 Essential (primary) hypertension: Secondary | ICD-10-CM

## 2012-03-11 DIAGNOSIS — E785 Hyperlipidemia, unspecified: Secondary | ICD-10-CM

## 2012-03-11 DIAGNOSIS — E119 Type 2 diabetes mellitus without complications: Secondary | ICD-10-CM

## 2012-03-11 DIAGNOSIS — M109 Gout, unspecified: Secondary | ICD-10-CM

## 2012-03-11 NOTE — Progress Notes (Signed)
Patient ID: Brenda Davis, female   DOB: 1965/11/15, 47 y.o.   MRN: [id number]  Chief Complaint  Patient presents with  . Weight Loss Surgery    Gastric Sleeve    HPI Brenda Davis is a 47 y.o. female. This patient presents for her initial weight loss surgery evaluation. She has a BMI of 52 with obesity related comorbidities of diabetes mellitus since 2001, hypertension, hyperlipidemia, and gout. She has a tender information session and is most interested in a sleeve gastrectomy. She also has a sister who had a sleeve gastrectomy 3 years ago and has been very successful with this. She says that she struggled with her weight ever since high school and has tried several diets including Atkins diet, Weight Watchers, joined several gyms, as well as participated in the bariatric clinic which was the most successful for her where she lost 60 pounds but surely regain the weight. She denies any cardiac problems and denies any reflux symptoms. She says that she does walk about 30 minutes per day about 3 times per week but has had a hard time losing weight and improving her diabetes.  HPI  Past Medical History  Diagnosis Date  . Hypertension   . Diabetes mellitus   . Gout   . Depression   . Gout   . Hyperlipidemia   . Blood transfusion without reported diagnosis     Past Surgical History  Procedure Laterality Date  . Cesarean section  1988, 1993  . Carpal tunnel release  2008    Family History  Problem Relation Age of Onset  . Hypertension Mother   . Diabetes Father   . Arthritis Other   . Hyperlipidemia Other   . Cancer Neg Hx   . Heart disease Neg Hx   . Kidney disease Neg Hx   . Stroke Neg Hx     Social History History  Substance Use Topics  . Smoking status: Never Smoker   . Smokeless tobacco: Never Used  . Alcohol Use: No    Allergies  Allergen Reactions  . Amlodipine     edema  . Acetaminophen-Codeine Nausea And Vomiting  . Hydrocodone-Acetaminophen Nausea And  Vomiting    Current Outpatient Prescriptions  Medication Sig Dispense Refill  . allopurinol (ZYLOPRIM) 100 MG tablet Take 100 mg by mouth daily.      . cloNIDine (CATAPRES) 0.1 MG tablet       . insulin glargine (LANTUS SOLOSTAR) 100 UNIT/ML injection 60 units qhs  5 pen  11  . Liraglutide (VICTOZA) 18 MG/3ML SOLN Inject 0.3 mLs (1.8 mg total) into the skin daily.  6 mL  11  . nebivolol (BYSTOLIC) 5 MG tablet Take 1 tablet (5 mg total) by mouth daily.  90 tablet  1  . olmesartan-hydrochlorothiazide (BENICAR HCT) 40-25 MG per tablet Take 1 tablet by mouth daily.  90 tablet  1  . rosuvastatin (CRESTOR) 20 MG tablet Take 1 tablet (20 mg total) by mouth daily.  90 tablet  1   No current facility-administered medications for this visit.    Review of Systems Review of Systems All other review of systems negative or noncontributory except as stated in the HPI  Blood pressure 132/86, pulse 66, temperature 97.9 F (36.6 C), temperature source Temporal, resp. rate 18, weight 302 lb 12.8 oz (137.349 kg).  Physical Exam Physical Exam Physical Exam  Nursing note and vitals reviewed. Constitutional: She is oriented to person, place, and time. She appears well-developed and well-nourished. No distress.  HENT:  Head: Normocephalic and atraumatic.  Mouth/Throat: No oropharyngeal exudate.  Eyes: Conjunctivae and EOM are normal. Pupils are equal, round, and reactive to light. Right eye exhibits no discharge. Left eye exhibits no discharge. No scleral icterus.  Neck: Normal range of motion. Neck supple. No tracheal deviation present.  Cardiovascular: Normal rate, regular rhythm, normal heart sounds and intact distal pulses.   Pulmonary/Chest: Effort normal and breath sounds normal. No stridor. No respiratory distress. She has no wheezes.  Abdominal: Soft. Bowel sounds are normal. She exhibits no distension and no mass. There is no tenderness. There is no rebound and no guarding.  Musculoskeletal:  Normal range of motion. She exhibits no edema and no tenderness.  Neurological: She is alert and oriented to person, place, and time.  Skin: Skin is warm and dry. No rash noted. She is not diaphoretic. No erythema. No pallor.  Psychiatric: She has a normal mood and affect. Her behavior is normal. Judgment and thought content normal.    Data Reviewed   Assessment    Morbid obesity with a BMI of 52 and obesity related comorbidities of diabetes mellitus, hypertension, hyperlipidemia, and gout. For a long discussion regarding all the surgical options for weight loss including nonsurgical and surgical options. We discussed the sleeve gastrectomy, a lap band, and Roux-en-Y gastric bypass as well as the pros and cons of the risks and benefits of each. I think that she would be a fine candidate for any other procedures that she desires. She is most interested in a sleeve gastrectomy especially since she has seen it be effective for her sister. We discussed the risk of the surgery.The risks of infection, bleeding, pain, scarring, weight regain, too little or too much weight loss, vitamin deficiencies and need for lifelong vitamin supplementation, hair loss, need for protein supplementation, leaks, stricture, reflux, food intolerance, need for reoperation and conversion to roux Y gastric bypass, need for open surgery, injury to spleen or surrounding structures, DVT's, PE, and death again discussed with the patient and the patient expressed understanding and desires to proceed with laparoscopic vertical sleeve gastrectomy, possible open, intraoperative endoscopy.      Plan    We will go ahead and start her on the past for weight loss surgery including nutrition labs, upper GI, nutrition and psychology evaluations.        [place], Haigler 03/11/2012, 11:59 AM

## 2012-03-21 ENCOUNTER — Ambulatory Visit (HOSPITAL_COMMUNITY)
Admission: RE | Admit: 2012-03-21 | Discharge: 2012-03-21 | Disposition: A | Discharge status: Home / Self Care | Payer: BC Managed Care – PPO | Source: Ambulatory Visit | Attending: General Surgery | Admitting: General Surgery

## 2012-03-21 ENCOUNTER — Encounter (HOSPITAL_COMMUNITY): Payer: Self-pay

## 2012-03-21 DIAGNOSIS — M109 Gout, unspecified: Secondary | ICD-10-CM | POA: Insufficient documentation

## 2012-03-21 DIAGNOSIS — E785 Hyperlipidemia, unspecified: Secondary | ICD-10-CM | POA: Insufficient documentation

## 2012-03-21 DIAGNOSIS — F3289 Other specified depressive episodes: Secondary | ICD-10-CM | POA: Insufficient documentation

## 2012-03-21 DIAGNOSIS — F329 Major depressive disorder, single episode, unspecified: Secondary | ICD-10-CM | POA: Insufficient documentation

## 2012-03-21 DIAGNOSIS — I10 Essential (primary) hypertension: Secondary | ICD-10-CM | POA: Insufficient documentation

## 2012-03-21 DIAGNOSIS — Z6841 Body Mass Index (BMI) 40.0 and over, adult: Secondary | ICD-10-CM | POA: Insufficient documentation

## 2012-03-21 DIAGNOSIS — E119 Type 2 diabetes mellitus without complications: Secondary | ICD-10-CM

## 2012-03-21 LAB — COMPREHENSIVE METABOLIC PANEL
ALT: 12 U/L (ref 0–35)
AST: 13 U/L (ref 0–37)
Albumin: 3.5 g/dL (ref 3.5–5.2)
Alkaline Phosphatase: 82 U/L (ref 39–117)
BUN: 40 mg/dL — ABNORMAL HIGH (ref 6–23)
CO2: 25 mEq/L (ref 19–32)
Calcium: 9.2 mg/dL (ref 8.4–10.5)
Chloride: 102 mEq/L (ref 96–112)
Creat: 2.28 mg/dL — ABNORMAL HIGH (ref 0.50–1.10)
Glucose, Bld: 182 mg/dL — ABNORMAL HIGH (ref 70–99)
Potassium: 4.9 mEq/L (ref 3.5–5.3)
Sodium: 141 mEq/L (ref 135–145)
Total Bilirubin: 0.3 mg/dL (ref 0.3–1.2)
Total Protein: 6.4 g/dL (ref 6.0–8.3)

## 2012-03-21 LAB — LIPID PANEL
Cholesterol: 246 mg/dL — ABNORMAL HIGH (ref 0–200)
HDL: 51 mg/dL (ref >39)
LDL Cholesterol: 163 mg/dL — ABNORMAL HIGH (ref 0–99)
Total CHOL/HDL Ratio: 4.8 Ratio
Triglycerides: 158 mg/dL — ABNORMAL HIGH (ref <150)
VLDL: 32 mg/dL (ref 0–40)

## 2012-03-21 LAB — CBC WITH DIFFERENTIAL/PLATELET
Basophils Absolute: 0 10*3/uL (ref 0.0–0.1)
Basophils Relative: 0 % (ref 0–1)
Eosinophils Absolute: 0.2 10*3/uL (ref 0.0–0.7)
Eosinophils Relative: 3 % (ref 0–5)
HCT: 35.6 % — ABNORMAL LOW (ref 36.0–46.0)
Hemoglobin: 11.7 g/dL — ABNORMAL LOW (ref 12.0–15.0)
Lymphocytes Relative: 34 % (ref 12–46)
Lymphs Abs: 2.4 10*3/uL (ref 0.7–4.0)
MCH: 28.2 pg (ref 26.0–34.0)
MCHC: 32.9 g/dL (ref 30.0–36.0)
MCV: 85.8 fL (ref 78.0–100.0)
Monocytes Absolute: 0.6 10*3/uL (ref 0.1–1.0)
Monocytes Relative: 9 % (ref 3–12)
Neutro Abs: 3.8 10*3/uL (ref 1.7–7.7)
Neutrophils Relative %: 54 % (ref 43–77)
Platelets: 363 10*3/uL (ref 150–400)
RBC: 4.15 MIL/uL (ref 3.87–5.11)
RDW: 14.9 % (ref 11.5–15.5)
WBC: 6.9 10*3/uL (ref 4.0–10.5)

## 2012-03-21 LAB — T3 UPTAKE: T3 Uptake: 32.3 % (ref 22.5–37.0)

## 2012-03-21 LAB — HEMOGLOBIN A1C
Hgb A1c MFr Bld: 8.4 % — ABNORMAL HIGH (ref <5.7)
Mean Plasma Glucose: 194 mg/dL — ABNORMAL HIGH (ref <117)

## 2012-03-21 LAB — TSH: TSH: 3.136 u[IU]/mL (ref 0.350–4.500)

## 2012-03-22 ENCOUNTER — Ambulatory Visit (INDEPENDENT_AMBULATORY_CARE_PROVIDER_SITE_OTHER): Payer: BC Managed Care – PPO | Admitting: Internal Medicine

## 2012-03-22 ENCOUNTER — Ambulatory Visit: Payer: BC Managed Care – PPO | Admitting: Internal Medicine

## 2012-03-22 ENCOUNTER — Encounter: Payer: Self-pay | Admitting: Internal Medicine

## 2012-03-22 VITALS — BP 140/84 | HR 72 | Temp 98.5°F | Ht 64.0 in | Wt 306.4 lb

## 2012-03-22 DIAGNOSIS — I10 Essential (primary) hypertension: Secondary | ICD-10-CM

## 2012-03-22 LAB — H. PYLORI ANTIBODY, IGG: H Pylori IgG: <0.4 {ISR}

## 2012-03-22 NOTE — Patient Instructions (Signed)

## 2012-03-22 NOTE — Progress Notes (Signed)
Subjective:    Patient ID: Brenda Davis, female    DOB: 07-20-1965, 47 y.o.   MRN: [id number]  HPI  Pt presents to the clinic today with c/o elevated blood pressure. This started 1 week ago. She was seen by Dr. Florene Glen at [facility] and started on clonidine for persistent elevated blood pressures above 160/90. She took the clonidine for about 1 week and started noticed marked swelling in the lower extremities. This occurred before when she was on amlodipine. She stopped the clonidine and the swelling has gone down. Her blood pressure was well controlled on the clonidine in the 130/80's. Since she has stopped it, her blood pressure has been 140/85 max. She denies any headaches, blurred vision, uncontrolled bleeding or chest pain.  Review of Systems      Past Medical History  Diagnosis Date  . Hypertension   . Diabetes mellitus   . Gout   . Depression   . Gout   . Hyperlipidemia   . Blood transfusion without reported diagnosis     Current Outpatient Prescriptions  Medication Sig Dispense Refill  . allopurinol (ZYLOPRIM) 100 MG tablet Take 100 mg by mouth daily.      . cloNIDine (CATAPRES) 0.1 MG tablet       . insulin glargine (LANTUS SOLOSTAR) 100 UNIT/ML injection 60 units qhs  5 pen  11  . Liraglutide (VICTOZA) 18 MG/3ML SOLN Inject 0.3 mLs (1.8 mg total) into the skin daily.  6 mL  11  . nebivolol (BYSTOLIC) 5 MG tablet Take 1 tablet (5 mg total) by mouth daily.  90 tablet  1  . olmesartan-hydrochlorothiazide (BENICAR HCT) 40-25 MG per tablet Take 1 tablet by mouth daily.  90 tablet  1  . rosuvastatin (CRESTOR) 20 MG tablet Take 1 tablet (20 mg total) by mouth daily.  90 tablet  1   No current facility-administered medications for this visit.    Allergies  Allergen Reactions  . Amlodipine     edema  . Acetaminophen-Codeine Nausea And Vomiting  . Hydrocodone-Acetaminophen Nausea And Vomiting    Family History  Problem Relation Age of Onset  .  Hypertension Mother   . Diabetes Father   . Arthritis Other   . Hyperlipidemia Other   . Cancer Neg Hx   . Heart disease Neg Hx   . Kidney disease Neg Hx   . Stroke Neg Hx     History   Social History  . Marital Status: Single    Spouse Name: N/A    Number of Children: N/A  . Years of Education: N/A   Occupational History  . Not on file.   Social History Main Topics  . Smoking status: Never Smoker   . Smokeless tobacco: Never Used  . Alcohol Use: No  . Drug Use: No  . Sexually Active: Not Currently    Birth Control/ Protection: Post-menopausal   Other Topics Concern  . Not on file   Social History Narrative  . No narrative on file     Constitutional: Denies fever, malaise, fatigue, headache or abrupt weight changes.  HEENT: Denies eye pain, eye redness, ear pain, ringing in the ears, wax buildup, runny nose, nasal congestion, bloody nose, or sore throat. Respiratory: Denies difficulty breathing, shortness of breath, cough or sputum production.   Cardiovascular: Denies chest pain, chest tightness, palpitations or swelling in the hands or feet.   Neurological: Denies dizziness, difficulty with memory, difficulty with speech or problems with balance and coordination.  No other specific complaints in a complete review of systems (except as listed in HPI above).  Objective:   Physical Exam   BP 140/84  Pulse 72  Temp(Src) 98.5 F (36.9 C) (Oral)  Ht 5' 4"  (1.626 m)  Wt 306 lb 6.4 oz (138.982 kg)  BMI 52.57 kg/m2  SpO2 97% Wt Readings from Last 3 Encounters:  03/22/12 306 lb 6.4 oz (138.982 kg)  03/11/12 302 lb 12.8 oz (137.349 kg)  12/30/11 300 lb (136.079 kg)    General: Appears her stated age, well developed, well nourished in NAD. HEENT: Head: normal shape and size; Eyes: sclera white, no icterus, conjunctiva pink, PERRLA and EOMs intact; Ears: Tm's gray and intact, normal light reflex; Nose: mucosa pink and moist, septum midline; Throat/Mouth: Teeth  present, mucosa pink and moist, no exudate, lesions or ulcerations noted.  Cardiovascular: Normal rate and rhythm. S1,S2 noted.  No murmur, rubs or gallops noted. No JVD or BLE edema. No carotid bruits noted. Pulmonary/Chest: Normal effort and positive vesicular breath sounds. No respiratory distress. No wheezes, rales or ronchi noted.  Neurological: Alert and oriented. Cranial nerves II-XII intact. Coordination normal. +DTRs bilaterally.   BMET    Component Value Date/Time   NA 141 03/21/2012 0952   K 4.9 03/21/2012 0952   CL 102 03/21/2012 0952   CO2 25 03/21/2012 0952   GLUCOSE 182* 03/21/2012 0952   BUN 40* 03/21/2012 0952   CREATININE 2.28* 03/21/2012 0952   CREATININE 2.2* 12/30/2011 1049   CALCIUM 9.2 03/21/2012 0952    Lipid Panel     Component Value Date/Time   CHOL 246* 03/21/2012 0952   TRIG 158* 03/21/2012 0952   HDL 51 03/21/2012 0952   CHOLHDL 4.8 03/21/2012 0952   VLDL 32 03/21/2012 0952   LDLCALC 163* 03/21/2012 0952    CBC    Component Value Date/Time   WBC 6.9 03/21/2012 0952   RBC 4.15 03/21/2012 0952   HGB 11.7* 03/21/2012 0952   HCT 35.6* 03/21/2012 0952   PLT 363 03/21/2012 0952   MCV 85.8 03/21/2012 0952   MCH 28.2 03/21/2012 0952   MCHC 32.9 03/21/2012 0952   RDW 14.9 03/21/2012 0952   LYMPHSABS 2.4 03/21/2012 0952   MONOABS 0.6 03/21/2012 0952   EOSABS 0.2 03/21/2012 0952   BASOSABS 0.0 03/21/2012 0952    Hgb A1C Lab Results  Component Value Date   HGBA1C 8.4* 03/21/2012        Assessment & Plan:  Hypertension:  Pt will monitor blood pressure closely over the next few weeks. I will talk with Dr. Ronnald Ramp and Dr. Florene Glen about other possible treatment options as patient has been intolerant of calcium channel blockers and maxed out on her beta blocker, diuretic, and ARB. Pt declines to try another calcium channel blockers secondary to side effects in the past. Pt agrees to work on diet and exercise. She is preparing for bariatric surgery that will happen at the end of may She will call  back to the office if blood pressure stays above 140/80 or if she experiences headaches, blurred vision, uncontrollable bleeding or chest pain.

## 2012-03-23 ENCOUNTER — Ambulatory Visit: Payer: BC Managed Care – PPO | Admitting: *Deleted

## 2012-03-23 ENCOUNTER — Encounter: Payer: Self-pay | Admitting: Internal Medicine

## 2012-03-29 ENCOUNTER — Encounter: Payer: Self-pay | Admitting: *Deleted

## 2012-03-29 ENCOUNTER — Encounter: Payer: BC Managed Care – PPO | Attending: General Surgery | Admitting: *Deleted

## 2012-03-29 DIAGNOSIS — Z713 Dietary counseling and surveillance: Secondary | ICD-10-CM | POA: Insufficient documentation

## 2012-03-29 NOTE — Patient Instructions (Addendum)
   Follow Pre-Op Nutrition Goals to prepare for Gastric Sleeve Surgery.   Call the [facility] at [phone] once you have been given your surgery date to enrolled in the Pre-Op Nutrition Class. You will need to attend this nutrition class 3-4 weeks prior to your surgery.

## 2012-03-29 NOTE — Progress Notes (Signed)
  Pre-Op Assessment Visit:  Pre-Operative Gastric Sleeve Surgery/Supervised Weight Loss Visit #1  Medical Nutrition Therapy:  Appt start time: 0830   End time:  0930.  Patient was seen on 03/29/2012 for Pre-Operative Gastric Sleeve Nutrition Assessment. Assessment and letter of approval faxed to [facility] Program coordinator on 03/29/2012.  Approval letter sent to [place] center and will be available in the chart under the media tab.  Handouts given during visit include:  Pre-Op Goals   Bariatric Surgery Protein Shakes  BELT Program Flyer  Patient to call for Pre-Op and Post-Op Nutrition Education at the [facility] when surgery is scheduled.

## 2012-04-04 ENCOUNTER — Telehealth: Payer: Self-pay | Admitting: Internal Medicine

## 2012-04-04 NOTE — Telephone Encounter (Signed)
The pt called and is hoping to get samples of Bystolic 10mg  and Benicar.

## 2012-04-04 NOTE — Telephone Encounter (Signed)
Pt informed samples requested ready for p/u Mon-Fri 8A-5P/SLS

## 2012-04-07 ENCOUNTER — Telehealth (INDEPENDENT_AMBULATORY_CARE_PROVIDER_SITE_OTHER): Payer: Self-pay | Admitting: General Surgery

## 2012-04-07 NOTE — Telephone Encounter (Signed)
Pt called to give update on her progress towards getting her bari surgery scheduled.  Her recent labs showed her A1C and Creatinine were abnormal.  She has subsequently seen Dr. Ronnald Ramp Arbie Cookey, PA-C) at her PCP and Dr. Florene Glen, [facility], to get her blood sugars and creatinine improved.  She was instructed by the Nutritionist to begin a 35-carb diet and has lost 15 lbs!  She has already noted an improvement in her daily blood sugars and is committed to qualifying for the weight loss surgery.  FYI.

## 2012-04-25 ENCOUNTER — Encounter: Payer: Self-pay | Admitting: *Deleted

## 2012-04-25 ENCOUNTER — Encounter: Payer: BC Managed Care – PPO | Attending: General Surgery | Admitting: *Deleted

## 2012-04-25 DIAGNOSIS — Z713 Dietary counseling and surveillance: Secondary | ICD-10-CM | POA: Insufficient documentation

## 2012-04-25 NOTE — Progress Notes (Signed)
3 Months Supervised Weight Loss Visit:  Pre-Operative Gastric Sleeve Surgery  Medical Nutrition Therapy:  Appt start time: [id number]  End time: 1700.  Primary concerns today:  Pre-Operative Bariatric Surgery Nutrition Management. Brenda Davis returns for her 2nd SWL visit with a 10 lb wt loss. Snacks include tangerines and low fat graham crackers with peanut butter. Reports having baked chicken and [place] burgers for dinner. Has increased ADLs and is excited about her wt loss so far.   Weight today: 298.8 lbs Weight change: - 10.2 lbs Total weight lost: 10.2 lbs BMI: 49.7 kg/m^2  CBG monitoring: 1-2 times/day Average CBG per patient: FBG @ 99-100 mg;  2hPP @ 120-130 mg (from 215-230 mg) Last patient reported A1c: 8.4% (03/21/12)  Recent physical activity:  More activity around home d/t increased energy levels  Progress Towards Goal(s):  In progress.   Nutritional Diagnosis:  [place]-3.3 Obesity related to past poor dietary habits and physical inactivity as evidenced by patient attending supervised weight loss for insurance approval of bariatric surgery.    Intervention:  Continued nutrition education including effects of exercise on blood glucose and CHO counting.

## 2012-04-25 NOTE — Patient Instructions (Addendum)
   Follow Pre-Op Nutrition Goals to prepare for Gastric Sleeve Surgery.  Continue to count carbohydrates.  Continue to increase exercise.

## 2012-04-28 ENCOUNTER — Telehealth: Payer: Self-pay | Admitting: *Deleted

## 2012-04-28 MED ORDER — GLUCOSE BLOOD VI STRP: ORAL_STRIP | Status: DC

## 2012-04-28 MED ORDER — FREESTYLE LANCETS MISC: Status: DC

## 2012-04-28 NOTE — Telephone Encounter (Signed)
Left msg on triage needing rx for test strips free lance insulinx call into [facility]. Called pt back will send to pharmacy....Johny Chess

## 2012-04-29 ENCOUNTER — Telehealth: Payer: Self-pay | Admitting: *Deleted

## 2012-04-29 MED ORDER — ONETOUCH LANCETS MISC: Status: DC

## 2012-04-29 MED ORDER — GLUCOSE BLOOD VI STRP: ORAL_STRIP | Status: DC

## 2012-04-29 NOTE — Telephone Encounter (Signed)
Pt return call bck inform her will leave one touch verio monitor for pick-up & send supplies to pharmacy. She also stated that the pharmacy sent md fax stating insurance wouldn't cover victoza. Will cover byetta & another one but can't recall name. Wanting to know what md want her to do abt the victoza...Brenda Davis

## 2012-04-29 NOTE — Telephone Encounter (Signed)
Called pt no answer LMOM with md response. Will leave BS monitor for pick-up as well...Brenda Davis

## 2012-04-29 NOTE — Telephone Encounter (Signed)
Left msg on triage stating insurance will not cover her victoza. rx that was sent in for freestyle strips will not cover as well. Wanting to pick up sample of victoza & one touch BS monitor. Called pt back no answer LMOM RTC...Johny Chess

## 2012-04-29 NOTE — Telephone Encounter (Signed)
She needs to come in for an OV and an A1C check

## 2012-05-09 ENCOUNTER — Ambulatory Visit: Payer: BC Managed Care – PPO | Admitting: Internal Medicine

## 2012-05-23 ENCOUNTER — Ambulatory Visit (INDEPENDENT_AMBULATORY_CARE_PROVIDER_SITE_OTHER): Payer: BC Managed Care – PPO | Admitting: Internal Medicine

## 2012-05-23 ENCOUNTER — Encounter: Payer: Self-pay | Admitting: Internal Medicine

## 2012-05-23 ENCOUNTER — Ambulatory Visit (INDEPENDENT_AMBULATORY_CARE_PROVIDER_SITE_OTHER): Payer: BC Managed Care – PPO

## 2012-05-23 VITALS — BP 134/72 | HR 72 | Temp 97.0°F | Resp 16 | Wt 303.0 lb

## 2012-05-23 DIAGNOSIS — N183 Chronic kidney disease, stage 3 unspecified: Secondary | ICD-10-CM

## 2012-05-23 DIAGNOSIS — E785 Hyperlipidemia, unspecified: Secondary | ICD-10-CM

## 2012-05-23 DIAGNOSIS — E1129 Type 2 diabetes mellitus with other diabetic kidney complication: Secondary | ICD-10-CM

## 2012-05-23 DIAGNOSIS — I10 Essential (primary) hypertension: Secondary | ICD-10-CM

## 2012-05-23 DIAGNOSIS — E1165 Type 2 diabetes mellitus with hyperglycemia: Secondary | ICD-10-CM

## 2012-05-23 LAB — BASIC METABOLIC PANEL
BUN: 60 mg/dL — ABNORMAL HIGH (ref 6–23)
CO2: 24 mEq/L (ref 19–32)
Calcium: 8.9 mg/dL (ref 8.4–10.5)
Chloride: 107 mEq/L (ref 96–112)
Creatinine, Ser: 3 mg/dL — ABNORMAL HIGH (ref 0.4–1.2)
GFR: 21.89 mL/min — ABNORMAL LOW (ref >60.00)
Glucose, Bld: 126 mg/dL — ABNORMAL HIGH (ref 70–99)
Potassium: 4.9 mEq/L (ref 3.5–5.1)
Sodium: 137 mEq/L (ref 135–145)

## 2012-05-23 LAB — LIPID PANEL
Cholesterol: 217 mg/dL — ABNORMAL HIGH (ref 0–200)
HDL: 36.6 mg/dL — ABNORMAL LOW (ref >39.00)
Total CHOL/HDL Ratio: 6
Triglycerides: 122 mg/dL (ref 0.0–149.0)
VLDL: 24.4 mg/dL (ref 0.0–40.0)

## 2012-05-23 LAB — HEMOGLOBIN A1C: Hgb A1c MFr Bld: 7.2 % — ABNORMAL HIGH (ref 4.6–6.5)

## 2012-05-23 MED ORDER — GLUCOSE BLOOD VI STRP: ORAL_STRIP | Status: DC

## 2012-05-23 MED ORDER — COLESEVELAM HCL 3.75 G PO PACK: 1.0000 | PACK | Freq: Every day | ORAL | Status: DC

## 2012-05-23 NOTE — Progress Notes (Signed)
  Subjective:    Patient ID: Brenda Davis, female    DOB: 02/20/1965, 47 y.o.   MRN: [id number]  Hypertension This is a chronic problem. The current episode started more than 1 year ago. The problem has been gradually improving since onset. The problem is controlled. Pertinent negatives include no anxiety, blurred vision, chest pain, headaches, malaise/fatigue, neck pain, orthopnea, palpitations, peripheral edema, PND, shortness of breath or sweats. Past treatments include angiotensin blockers, central alpha agonists, diuretics and beta blockers. The current treatment provides moderate improvement. There are no compliance problems.  Hypertensive end-organ damage includes kidney disease. Identifiable causes of hypertension include chronic renal disease.      Review of Systems  Constitutional: Negative.  Negative for malaise/fatigue.  HENT: Negative.  Negative for neck pain.   Eyes: Negative.  Negative for blurred vision.  Respiratory: Negative.  Negative for shortness of breath.   Cardiovascular: Negative.  Negative for chest pain, palpitations, orthopnea and PND.  Gastrointestinal: Negative.   Endocrine: Negative.   Genitourinary: Negative.   Musculoskeletal: Negative.   Skin: Negative.   Allergic/Immunologic: Negative.   Neurological: Negative.  Negative for headaches.  Hematological: Negative.   Psychiatric/Behavioral: Negative.        Objective:   Physical Exam  Vitals reviewed. Constitutional: She is oriented to person, place, and time. She appears well-developed and well-nourished. No distress.  HENT:  Head: Normocephalic and atraumatic.  Mouth/Throat: Oropharynx is clear and moist. No oropharyngeal exudate.  Eyes: Conjunctivae are normal. Right eye exhibits no discharge. Left eye exhibits no discharge. No scleral icterus.  Neck: Normal range of motion. Neck supple. No JVD present. No tracheal deviation present. No thyromegaly present.  Cardiovascular: Normal rate, regular  rhythm, normal heart sounds and intact distal pulses.  Exam reveals no gallop and no friction rub.   No murmur heard. Pulmonary/Chest: Effort normal and breath sounds normal. No stridor. No respiratory distress. She has no wheezes. She has no rales. She exhibits no tenderness.  Abdominal: Soft. Bowel sounds are normal. She exhibits no distension and no mass. There is no tenderness. There is no rebound and no guarding.  Musculoskeletal: Normal range of motion. She exhibits no edema and no tenderness.  Lymphadenopathy:    She has no cervical adenopathy.  Neurological: She is oriented to person, place, and time.  Skin: Skin is warm and dry. No rash noted. She is not diaphoretic. No erythema. No pallor.  Psychiatric: She has a normal mood and affect. Her behavior is normal. Judgment and thought content normal.     Lab Results  Component Value Date   WBC 6.9 03/21/2012   HGB 11.7* 03/21/2012   HCT 35.6* 03/21/2012   PLT 363 03/21/2012   GLUCOSE 182* 03/21/2012   CHOL 246* 03/21/2012   TRIG 158* 03/21/2012   HDL 51 03/21/2012   LDLDIRECT 183.6 10/21/2011   LDLCALC 163* 03/21/2012   ALT 12 03/21/2012   AST 13 03/21/2012   NA 141 03/21/2012   K 4.9 03/21/2012   CL 102 03/21/2012   CREATININE 2.28* 03/21/2012   BUN 40* 03/21/2012   CO2 25 03/21/2012   TSH 3.136 03/21/2012   HGBA1C 8.4* 03/21/2012   MICROALBUR 159.00* 09/17/2009       Assessment & Plan:

## 2012-05-23 NOTE — Patient Instructions (Signed)

## 2012-05-23 NOTE — Assessment & Plan Note (Addendum)
Her blood sugars have remained a little high so I have asked her to start welchol I will check her A1C and will monitor her renal function today

## 2012-05-23 NOTE — Assessment & Plan Note (Signed)
She will maintain good control of her BP She will get better control of her blood sugars She will avoid nephrotoxic agents

## 2012-05-23 NOTE — Assessment & Plan Note (Signed)
Start welchol and check her FLP today

## 2012-05-23 NOTE — Assessment & Plan Note (Signed)
Her BP is well controlled today 

## 2012-05-24 ENCOUNTER — Encounter: Payer: Self-pay | Admitting: Internal Medicine

## 2012-05-24 ENCOUNTER — Encounter: Payer: BC Managed Care – PPO | Attending: General Surgery | Admitting: *Deleted

## 2012-05-24 ENCOUNTER — Encounter: Payer: Self-pay | Admitting: *Deleted

## 2012-05-24 DIAGNOSIS — Z713 Dietary counseling and surveillance: Secondary | ICD-10-CM | POA: Insufficient documentation

## 2012-05-24 LAB — LDL CHOLESTEROL, DIRECT: Direct LDL: 149.2 mg/dL

## 2012-05-24 NOTE — Progress Notes (Addendum)
3 Months Supervised Weight Loss Visit:  Pre-Operative Gastric Sleeve Surgery  Medical Nutrition Therapy:  Appt start time: [id number]  End time: 1700.  Primary concerns today:  Pre-Operative Bariatric Surgery Nutrition Management. Brenda Davis returns for her 3rd SWL visit with a 3 lb wt gain. Reports skipping breakfast and decreased exercise.  Plans to resume asap. Reports some mild constipation.   Start weight: 309.0 lbs Weight today: 301.7 lbs Weight change: 2.9 lbs GAIN Total weight lost: 7.3 lbs BMI: 51.2 kg/m^2  CBG monitoring: 1-2 times/day Average CBG per patient: FBG @ 101-103 mg;  2hPP @ 140-141 mg  Last patient reported A1c: 8.4% (03/21/12)  Recent physical activity:  Less activity since last visit d/t working more overtime.   Progress Towards Goal(s):  In progress.   Nutritional Diagnosis:  [place]-3.3 Obesity related to past poor dietary habits and physical inactivity as evidenced by patient attending supervised weight loss for insurance approval of bariatric surgery.    Intervention:  Continued nutrition education including effects of exercise on blood glucose and CHO counting. Also discussed increasing fiber to help with constipation.   Samples given during visit include:   Unjury Protein Powder: 1 pkt Lot: [id number]; Exp: 06/15

## 2012-05-24 NOTE — Patient Instructions (Addendum)
   Follow Pre-Op Nutrition Goals to prepare for Gastric Sleeve Surgery.  Continue to count carbohydrates.  Aim for 30 min of exercise daily.  Increase fiber-containing foods to help with constipation.

## 2012-06-21 ENCOUNTER — Encounter: Payer: BC Managed Care – PPO | Attending: General Surgery | Admitting: *Deleted

## 2012-06-21 ENCOUNTER — Encounter: Payer: Self-pay | Admitting: *Deleted

## 2012-06-21 DIAGNOSIS — Z713 Dietary counseling and surveillance: Secondary | ICD-10-CM | POA: Insufficient documentation

## 2012-06-21 NOTE — Patient Instructions (Addendum)
   Follow Pre-Op Nutrition Goals to prepare for Sleeve Gastrectomy Surgery.   Call the [facility] at [phone] once you have been given your surgery date to enrolled in the Pre-Op Nutrition Class. You will need to attend this nutrition class 3-4 weeks prior to your surgery.

## 2012-06-21 NOTE — Progress Notes (Signed)
3 Months Supervised Weight Loss Visit:  Pre-Operative Gastric Sleeve Surgery  Medical Nutrition Therapy:  Appt start time: [id number]  End time: 1700.  Primary concerns today:  Pre-Operative Bariatric Surgery Nutrition Management. Brenda Davis returns for her last SWL visit with an 8.5 lb wt loss. Reports recent gout flare in right foot 1.5 weeks ago, which was treated with prednisone. She was unable to exercise d/t pain in foot, though made much better food choices.  Reports kidney function has slightly declined per patient and labs in EPIC.   Start weight: 309.0 lbs Weight today: 293.2 lbs Weight change: 8.5 lbs Total weight lost: 15.8 lbs BMI: 50.3 kg/m^2  CBG monitoring: 1-2 times/day Average CBG per patient: FBG @ 101 mg;  2hPP @ 189-200 mg  Last A1c: 7.2% (05/23/12 per EPIC)  Recent physical activity:  Less activity for last 1.5 weeks d/t gout flare. Resumed 2 days ago.   Progress Towards Goal(s):  In progress.   Nutritional Diagnosis:  [place]-3.3 Obesity related to past poor dietary habits and physical inactivity as evidenced by patient attending supervised weight loss for insurance approval of bariatric surgery.   Intervention: Continued nutrition education including effects of exercise on blood glucose and CHO counting. Also discussed foods that cause gout flares.   Monitoring:  Patient to call the [facility] to enroll in Pre-Op and Post-Op Nutrition Education when surgery date is scheduled.

## 2012-06-28 ENCOUNTER — Encounter: Payer: Self-pay | Admitting: *Deleted

## 2012-06-28 ENCOUNTER — Encounter: Payer: BC Managed Care – PPO | Admitting: *Deleted

## 2012-06-28 NOTE — Progress Notes (Signed)
   Pre-Operative Bariatric Surgery Nutrition Education:  Appt start time: 1000   End time: 1100.  Patient was seen on 06/28/2012 for Pre-Operative Bariatric Surgery Education at the [facility].   Surgery date: 07/19/12 Surgery type: Sleeve Gastrectomy Start weight at [facility]: 309.0 lbs Weight today: 300.5 lbs  TANITA  BODY COMP RESULTS  06/28/12   BMI (kg/m^2) 51.6   Fat Mass (lbs) 159.5   Fat Free Mass (lbs) 141.0   Total Body Water (lbs) 103.0   Samples given per MNT protocol. Patient educated on appropriate usage: Bariatric Advantage Multivitamin - 2 ea Lot # [id number]; Exp: 06/15 Lot # [id number]; Exp: 10/15  Bariatric Advantage Calcium Citrate - 2 ea Lot # [id number]; Exp:10/15  Celebrate Vitamins Multivitamin - 2 ea Lot # [id number]; Exp: 11/14 Lot # [id number]; Exp: 01/15  Celebrate Vitamins Calcium Citrate - 1 ea Lot # [id number];  Exp: 09/15  Unjury Protein Powder: 2 pkts ea Lot # [id number]; Exp: 09/15 Lot # [id number]; Exp: 07/15 Lot # [id number]; Exp: 09/15  Premier Protein Shake: 1 bottle Lot # [id number]; Exp: 01/31/13  The following the learning objective met by the patient during this visit:  Identify Pre-Op Dietary Goals and will begin 2 weeks pre-operatively  Identify appropriate sources of fluids and proteins   State protein recommendations and appropriate sources pre and post-operatively  Identify Post-Operative Dietary Goals and will follow for 2 weeks post-operatively  Identify appropriate multivitamin and calcium sources  Describe the need for physical activity post-operatively and will follow MD recommendations  State when to call healthcare provider regarding medication questions or post-operative complications  Handouts given during visit include:  Pre-Op Bariatric Surgery Diet Handout  Protein Shake Handout  Post-Op Bariatric Surgery Nutrition Handout  BELT Program Information Flyer  Support Group Information Flyer  [facility]  Pharmacy Bariatric Supplements Price List  Follow-Up Plan: Patient will follow-up at [facility] 2 weeks post operatively for diet advancement per MD.

## 2012-06-28 NOTE — Patient Instructions (Addendum)
Follow:   Pre-Op Diet per MD 2 weeks prior to surgery  Phase 2- Liquids (clear/full) 2 weeks after surgery  Vitamin/Mineral/Calcium guidelines for purchasing bariatric supplements  Exercise guidelines pre and post-op per MD  Follow-up at [facility] in 2 weeks post-op for diet advancement. Contact Reilynn Lauro as needed with questions/concerns. 

## 2012-07-01 ENCOUNTER — Other Ambulatory Visit (INDEPENDENT_AMBULATORY_CARE_PROVIDER_SITE_OTHER): Payer: Self-pay | Admitting: General Surgery

## 2012-07-06 ENCOUNTER — Telehealth: Payer: Self-pay

## 2012-07-06 NOTE — Telephone Encounter (Signed)
Patient called [facility] requesting samples of Benicar 40-25mg 

## 2012-07-06 NOTE — Telephone Encounter (Signed)
Patient notified to pick up samples//[facility]

## 2012-07-11 ENCOUNTER — Ambulatory Visit (INDEPENDENT_AMBULATORY_CARE_PROVIDER_SITE_OTHER): Payer: BC Managed Care – PPO | Admitting: Internal Medicine

## 2012-07-11 ENCOUNTER — Encounter: Payer: Self-pay | Admitting: Internal Medicine

## 2012-07-11 VITALS — BP 130/70 | HR 77 | Temp 98.0°F | Resp 16 | Ht 64.0 in | Wt 297.0 lb

## 2012-07-11 DIAGNOSIS — E1165 Type 2 diabetes mellitus with hyperglycemia: Secondary | ICD-10-CM

## 2012-07-11 DIAGNOSIS — I10 Essential (primary) hypertension: Secondary | ICD-10-CM

## 2012-07-11 DIAGNOSIS — E1129 Type 2 diabetes mellitus with other diabetic kidney complication: Secondary | ICD-10-CM

## 2012-07-11 NOTE — Assessment & Plan Note (Signed)
Her BP is well controlled 

## 2012-07-11 NOTE — Progress Notes (Signed)
  Subjective:    Patient ID: Brenda Davis, female    DOB: 04-08-1965, 47 y.o.   MRN: [id number]  Hypertension This is a chronic problem. The current episode started more than 1 year ago. The problem has been gradually improving since onset. The problem is controlled. Pertinent negatives include no anxiety, blurred vision, chest pain, headaches, malaise/fatigue, neck pain, orthopnea, palpitations, peripheral edema, PND, shortness of breath or sweats. Past treatments include beta blockers, angiotensin blockers, diuretics and central alpha agonists. There are no compliance problems.  Hypertensive end-organ damage includes kidney disease. Identifiable causes of hypertension include chronic renal disease.      Review of Systems  Constitutional: Negative.  Negative for fever, chills, malaise/fatigue, diaphoresis, activity change, appetite change, fatigue and unexpected weight change.  HENT: Negative.  Negative for neck pain.   Eyes: Negative.  Negative for blurred vision.  Respiratory: Negative.  Negative for cough, chest tightness, shortness of breath, wheezing and stridor.   Cardiovascular: Negative.  Negative for chest pain, palpitations, orthopnea, leg swelling and PND.  Gastrointestinal: Negative.  Negative for nausea, vomiting, abdominal pain, diarrhea and constipation.  Endocrine: Negative.  Negative for polydipsia, polyphagia and polyuria.  Genitourinary: Negative.   Musculoskeletal: Negative.  Negative for myalgias, back pain, joint swelling and gait problem.  Skin: Negative.   Allergic/Immunologic: Negative.   Neurological: Negative.  Negative for dizziness, tremors, weakness, light-headedness and headaches.  Hematological: Negative.  Negative for adenopathy. Does not bruise/bleed easily.  Psychiatric/Behavioral: Negative.        Objective:   Physical Exam  Vitals reviewed. Constitutional: She is oriented to person, place, and time. She appears well-developed and well-nourished.  No distress.  HENT:  Head: Normocephalic and atraumatic.  Mouth/Throat: Oropharynx is clear and moist. No oropharyngeal exudate.  Eyes: Conjunctivae are normal. Right eye exhibits no discharge. Left eye exhibits no discharge. No scleral icterus.  Neck: Normal range of motion. Neck supple. No JVD present. No tracheal deviation present. No thyromegaly present.  Cardiovascular: Normal rate, regular rhythm, normal heart sounds and intact distal pulses.  Exam reveals no gallop and no friction rub.   No murmur heard. Pulmonary/Chest: Effort normal and breath sounds normal. No stridor. No respiratory distress. She has no wheezes. She has no rales. She exhibits no tenderness.  Abdominal: Soft. Bowel sounds are normal. She exhibits no distension and no mass. There is no tenderness. There is no rebound and no guarding.  Musculoskeletal: Normal range of motion. She exhibits no edema and no tenderness.  Lymphadenopathy:    She has no cervical adenopathy.  Neurological: She is oriented to person, place, and time.  Skin: Skin is warm and dry. No rash noted. She is not diaphoretic. No erythema. No pallor.  Psychiatric: She has a normal mood and affect. Her behavior is normal. Judgment and thought content normal.          Assessment & Plan:

## 2012-07-11 NOTE — Assessment & Plan Note (Signed)
Her last A1C looks good

## 2012-07-14 ENCOUNTER — Encounter (HOSPITAL_COMMUNITY): Payer: Self-pay | Admitting: Pharmacy Technician

## 2012-07-14 ENCOUNTER — Emergency Department (INDEPENDENT_AMBULATORY_CARE_PROVIDER_SITE_OTHER): Payer: BC Managed Care – PPO

## 2012-07-14 ENCOUNTER — Encounter (HOSPITAL_COMMUNITY): Payer: Self-pay | Admitting: *Deleted

## 2012-07-14 ENCOUNTER — Emergency Department (HOSPITAL_COMMUNITY)
Admission: EM | Admit: 2012-07-14 | Discharge: 2012-07-14 | Disposition: A | Discharge status: Home / Self Care | Payer: BC Managed Care – PPO | Source: Home / Self Care | Attending: Emergency Medicine | Admitting: Emergency Medicine

## 2012-07-14 DIAGNOSIS — S4350XA Sprain of unspecified acromioclavicular joint, initial encounter: Secondary | ICD-10-CM

## 2012-07-14 MED ORDER — HYDROCODONE-ACETAMINOPHEN 5-325 MG PO TABS: 2.0000 | ORAL_TABLET | ORAL | Status: DC | PRN

## 2012-07-14 NOTE — ED Notes (Signed)
Pt  Reports   l  Shoulder pain    X  3  Days    denys  Any  Injury  Pain is  Worse  On  rom             Denys   Any    History  Of  Similar   Symptoms   Pt  Has  A  History  Of     Htn

## 2012-07-14 NOTE — ED Provider Notes (Signed)
History    CSN: [id number] Arrival date & time 07/14/12  1348  First MD Initiated Contact with Patient 07/14/12 1552     Chief Complaint  Patient presents with  . Shoulder Pain   (Consider location/radiation/quality/duration/timing/severity/associated sxs/prior Treatment) HPI Comments: Patient presents to urgent care, described for the last 3 days she's been having left shoulder pain. and is exacerbated with movement, especially raising her left arm up and in the left posterior aspect of her shoulder. She denies any recent injuries or falls. She does however describes that intermittently in for long for long periods she's been sometimes hearing or perceiving a click or pop sound to her left acromioclavicular region. She says the area looks somewhat swollen. Since yesterday she's been taking Tylenol every 6 hours or so for discomfort.  Patient denies any coexistent symptoms such as chest pains, shortness of breath, dizziness, palpitations, cough or abdominal pain patient denies any upper extremities numbness, or  tingling sensation.  Patient is a 47 y.o. female presenting with shoulder pain. The history is provided by the patient.  Shoulder Pain This is a new problem. The current episode started more than 2 days ago. The problem occurs constantly. The problem has been gradually worsening. Pertinent negatives include no chest pain, no abdominal pain, no headaches and no shortness of breath. Exacerbated by: raising and putting my arm behind my back.. She has tried acetaminophen for the symptoms. The treatment provided no relief.   Past Medical History  Diagnosis Date  . Hypertension   . Diabetes mellitus   . Gout   . Depression   . Gout   . Hyperlipidemia   . Blood transfusion without reported diagnosis   . Morbid obesity    Past Surgical History  Procedure Laterality Date  . Cesarean section  1988, 1993  . Carpal tunnel release  2008   Family History  Problem Relation Age of  Onset  . Hypertension Mother   . Diabetes Father   . Arthritis Other   . Hyperlipidemia Other   . Cancer Neg Hx   . Heart disease Neg Hx   . Kidney disease Neg Hx   . Stroke Neg Hx    History  Substance Use Topics  . Smoking status: Never Smoker   . Smokeless tobacco: Never Used  . Alcohol Use: No   OB History   Grav Para Term Preterm Abortions TAB SAB Ect Mult Living                 Review of Systems  Constitutional: Negative for fever, chills, diaphoresis, activity change, appetite change, fatigue and unexpected weight change.  Respiratory: Negative for shortness of breath.   Cardiovascular: Negative for chest pain.  Gastrointestinal: Negative for abdominal pain.  Genitourinary: Negative for dysuria, urgency, frequency, hematuria, flank pain, decreased urine volume, vaginal bleeding, vaginal discharge, genital sores, vaginal pain and menstrual problem.  Musculoskeletal: Negative for myalgias, joint swelling, arthralgias and gait problem.  Skin: Negative for rash and wound.  Neurological: Negative for headaches.    Allergies  Amlodipine; Acetaminophen-codeine; Clonidine derivatives; Codeine; Hydrocodone-acetaminophen; and Welchol  Home Medications   Current Outpatient Rx  Name  Route  Sig  Dispense  Refill  . acetaminophen (TYLENOL) 500 MG tablet   Oral   Take 500 mg by mouth every 6 (six) hours as needed for pain.         Marland Kitchen HYDROcodone-acetaminophen (NORCO/VICODIN) 5-325 MG per tablet   Oral   Take 2 tablets by mouth every  4 (four) hours as needed for pain.   15 tablet   0   . Insulin Glargine (LANTUS SOLOSTAR) 100 UNIT/ML SOPN   Subcutaneous   Inject 60 Units into the skin daily.         . Liraglutide (VICTOZA) 18 MG/3ML SOLN   Subcutaneous   Inject 0.3 mLs (1.8 mg total) into the skin daily.   6 mL   11   . magnesium hydroxide (MILK OF MAGNESIA) 400 MG/5ML suspension   Oral   Take 30 mLs by mouth daily as needed for constipation.         .  Multiple Vitamins-Minerals (HAIR/SKIN/NAILS) TABS   Oral   Take 1 tablet by mouth daily.         . nebivolol (BYSTOLIC) 10 MG tablet   Oral   Take 10 mg by mouth every evening.          . olmesartan-hydrochlorothiazide (BENICAR HCT) 40-25 MG per tablet   Oral   Take 1 tablet by mouth every morning.         . rosuvastatin (CRESTOR) 20 MG tablet   Oral   Take 20 mg by mouth every morning.          BP 151/72  Pulse 67  Temp(Src) 98 F (36.7 C) (Oral)  Resp 16  SpO2 100% Physical Exam  Nursing note and vitals reviewed. Constitutional: Vital signs are normal. She appears well-developed and well-nourished.  Non-toxic appearance. She does not have a sickly appearance. No distress.  Eyes: Conjunctivae are normal.  Neck: Neck supple. No tracheal deviation present. No thyromegaly present.  Abdominal: Soft.  Musculoskeletal: She exhibits tenderness.       Left shoulder: She exhibits decreased range of motion, tenderness, bony tenderness and swelling. She exhibits no effusion, no crepitus, no deformity, no laceration, no pain, no spasm, normal pulse and normal strength.       Arms: Skin: No rash noted. No erythema.    ED Course  Procedures (including critical care time) Labs Reviewed - No data to display Dg Shoulder Left  07/14/2012   *RADIOLOGY REPORT*  Clinical Data: Left shoulder pain.  LEFT SHOULDER - 2+ VIEW  Comparison: None  Findings: The joint spaces are maintained.  No acute bony findings. No abnormal soft tissue calcifications.  Left lung is clear.  IMPRESSION: No acute bony findings.   Original Report Authenticated By: Marijo Sanes, M.D.   1. Acromioclavicular (joint) (ligament) sprain     MDM  No subluxation or dislocations or acromioclavicular separation. Patient is exquisitely tender in the area with limited range of motion. Will provide patient with a sling to use for comfort have been advised to not use it all the time and try to perform active range of motion  as tolerated. Patient did prescribed 15 tablets of Lortab and instructed to followup with her Dr. if pain continues.  Rosana Hoes, MD 07/14/12 [phone]

## 2012-07-15 ENCOUNTER — Encounter (HOSPITAL_COMMUNITY)
Admission: RE | Admit: 2012-07-15 | Discharge: 2012-07-15 | Disposition: A | Discharge status: Home / Self Care | Payer: BC Managed Care – PPO | Source: Ambulatory Visit | Attending: General Surgery | Admitting: General Surgery

## 2012-07-15 ENCOUNTER — Ambulatory Visit (INDEPENDENT_AMBULATORY_CARE_PROVIDER_SITE_OTHER): Payer: BC Managed Care – PPO | Admitting: General Surgery

## 2012-07-15 ENCOUNTER — Encounter (HOSPITAL_COMMUNITY): Payer: Self-pay

## 2012-07-15 ENCOUNTER — Encounter (INDEPENDENT_AMBULATORY_CARE_PROVIDER_SITE_OTHER): Payer: Self-pay | Admitting: General Surgery

## 2012-07-15 ENCOUNTER — Ambulatory Visit (HOSPITAL_COMMUNITY)
Admission: RE | Admit: 2012-07-15 | Discharge: 2012-07-15 | Disposition: A | Discharge status: Home / Self Care | Payer: BC Managed Care – PPO | Source: Ambulatory Visit | Attending: General Surgery | Admitting: General Surgery

## 2012-07-15 DIAGNOSIS — E119 Type 2 diabetes mellitus without complications: Secondary | ICD-10-CM

## 2012-07-15 DIAGNOSIS — E1165 Type 2 diabetes mellitus with hyperglycemia: Secondary | ICD-10-CM

## 2012-07-15 DIAGNOSIS — N2889 Other specified disorders of kidney and ureter: Secondary | ICD-10-CM

## 2012-07-15 DIAGNOSIS — E1129 Type 2 diabetes mellitus with other diabetic kidney complication: Secondary | ICD-10-CM

## 2012-07-15 DIAGNOSIS — Z01818 Encounter for other preprocedural examination: Secondary | ICD-10-CM | POA: Insufficient documentation

## 2012-07-15 DIAGNOSIS — I10 Essential (primary) hypertension: Secondary | ICD-10-CM

## 2012-07-15 DIAGNOSIS — Z6841 Body Mass Index (BMI) 40.0 and over, adult: Secondary | ICD-10-CM

## 2012-07-15 DIAGNOSIS — N183 Chronic kidney disease, stage 3 unspecified: Secondary | ICD-10-CM

## 2012-07-15 HISTORY — DX: Pain, unspecified: R52

## 2012-07-15 HISTORY — DX: Chronic kidney disease, unspecified: N18.9

## 2012-07-15 LAB — CBC WITH DIFFERENTIAL/PLATELET
Basophils Absolute: 0 10*3/uL (ref 0.0–0.1)
Basophils Relative: 0 % (ref 0–1)
Eosinophils Absolute: 0.2 10*3/uL (ref 0.0–0.7)
Eosinophils Relative: 2 % (ref 0–5)
HCT: 33.6 % — ABNORMAL LOW (ref 36.0–46.0)
Hemoglobin: 10.7 g/dL — ABNORMAL LOW (ref 12.0–15.0)
Lymphocytes Relative: 27 % (ref 12–46)
Lymphs Abs: 2.7 10*3/uL (ref 0.7–4.0)
MCH: 27.5 pg (ref 26.0–34.0)
MCHC: 31.8 g/dL (ref 30.0–36.0)
MCV: 86.4 fL (ref 78.0–100.0)
Monocytes Absolute: 1 10*3/uL (ref 0.1–1.0)
Monocytes Relative: 10 % (ref 3–12)
Neutro Abs: 6.1 10*3/uL (ref 1.7–7.7)
Neutrophils Relative %: 61 % (ref 43–77)
Platelets: 371 10*3/uL (ref 150–400)
RBC: 3.89 MIL/uL (ref 3.87–5.11)
RDW: 13.2 % (ref 11.5–15.5)
WBC: 10 10*3/uL (ref 4.0–10.5)

## 2012-07-15 LAB — COMPREHENSIVE METABOLIC PANEL
ALT: 15 U/L (ref 0–35)
AST: 15 U/L (ref 0–37)
Albumin: 3.3 g/dL — ABNORMAL LOW (ref 3.5–5.2)
Alkaline Phosphatase: 85 U/L (ref 39–117)
BUN: 60 mg/dL — ABNORMAL HIGH (ref 6–23)
CO2: 25 mEq/L (ref 19–32)
Calcium: 9.6 mg/dL (ref 8.4–10.5)
Chloride: 101 mEq/L (ref 96–112)
Creatinine, Ser: 3.1 mg/dL — ABNORMAL HIGH (ref 0.50–1.10)
GFR calc Af Amer: 19 mL/min — ABNORMAL LOW (ref >90)
GFR calc non Af Amer: 17 mL/min — ABNORMAL LOW (ref >90)
Glucose, Bld: 135 mg/dL — ABNORMAL HIGH (ref 70–99)
Potassium: 4.5 mEq/L (ref 3.5–5.1)
Sodium: 136 mEq/L (ref 135–145)
Total Bilirubin: 0.2 mg/dL — ABNORMAL LOW (ref 0.3–1.2)
Total Protein: 7.4 g/dL (ref 6.0–8.3)

## 2012-07-15 LAB — SURGICAL PCR SCREEN
MRSA, PCR: NEGATIVE
Staphylococcus aureus: NEGATIVE

## 2012-07-15 NOTE — Progress Notes (Signed)
Patient ID: Brenda Davis, female   DOB: 11/04/1965, 47 y.o.   MRN: [id number]  Chief Complaint  Patient presents with  . Bariatric Pre-op    HPI Brenda Davis is a 47 y.o. female.  This patient comes in today for her preoperative surgery evaluation in preparation for vertical sleeve gastrectomy. She has BMI of 51 at with obesity related comorbidities of diabetes mellitus hypertension hyperlipidemia and gout. She remains most interested in her vertical sleeve gastrectomy. She has a sister who had the procedure done at another facility 3 years ago and has done very well from her procedure. She has a tender information session and has had a normal upper GI. She has been cleared by nutrition and psychology. She denies any significant changes from our last visit except recent left shoulder pain for which she was evaluated in the urgent care and she was placed in a shoulder sling. She says the x-rays were negative. HPI  Past Medical History  Diagnosis Date  . Hypertension   . Diabetes mellitus   . Gout   . Depression   . Gout   . Hyperlipidemia   . Blood transfusion without reported diagnosis   . Morbid obesity   . Chronic kidney disease     STAGE 3 CHRONIC KIDNEY DISEASE SECONDARY TO DIABETIC GLOMERULOSCLEROSIS AND UNCONTROLLED HYPERTENSION - PER OFFICE NOTES DR. Florene Glen [facility].  Marland Kitchen Headache(784.0)     CHRONIC H/A'S    Past Surgical History  Procedure Laterality Date  . Cesarean section  1988, 1993  . Carpal tunnel release  2008    Family History  Problem Relation Age of Onset  . Hypertension Mother   . Diabetes Father   . Arthritis Other   . Hyperlipidemia Other   . Cancer Neg Hx   . Heart disease Neg Hx   . Kidney disease Neg Hx   . Stroke Neg Hx     Social History History  Substance Use Topics  . Smoking status: Former Smoker -- 20 years    Types: Cigarettes  . Smokeless tobacco: Never Used     Comment: QUIT SMOKING ABOUT 2009  . Alcohol Use: 0.6  oz/week    1 Glasses of wine per week    Allergies  Allergen Reactions  . Amlodipine     edema  . Clonidine Derivatives Swelling  . Welchol (Colesevelam Hcl) Nausea Only    Current Outpatient Prescriptions  Medication Sig Dispense Refill  . acetaminophen (TYLENOL) 500 MG tablet Take 500 mg by mouth every 6 (six) hours as needed for pain.      Marland Kitchen HYDROcodone-acetaminophen (NORCO/VICODIN) 5-325 MG per tablet Take 2 tablets by mouth every 4 (four) hours as needed for pain.  15 tablet  0  . Insulin Glargine (LANTUS SOLOSTAR) 100 UNIT/ML SOPN Inject 60 Units into the skin daily.      . Liraglutide (VICTOZA) 18 MG/3ML SOLN Inject 0.3 mLs (1.8 mg total) into the skin daily.  6 mL  11  . magnesium hydroxide (MILK OF MAGNESIA) 400 MG/5ML suspension Take 30 mLs by mouth daily as needed for constipation.      . Multiple Vitamins-Minerals (HAIR/SKIN/NAILS) TABS Take 1 tablet by mouth daily.      . nebivolol (BYSTOLIC) 10 MG tablet Take 10 mg by mouth every evening.       . olmesartan-hydrochlorothiazide (BENICAR HCT) 40-25 MG per tablet Take 1 tablet by mouth every morning.      . rosuvastatin (CRESTOR) 20 MG tablet Take 20  mg by mouth every morning.       No current facility-administered medications for this visit.    Review of Systems Review of Systems All other review of systems negative or noncontributory except as stated in the HPI  Blood pressure 132/86, pulse 72, temperature 97.8 F (36.6 C), temperature source Temporal, resp. rate 16, height 5\' 4"  (1.626 m), weight 296 lb 12.8 oz (134.628 kg).  Physical Exam Physical Exam Physical Exam  Nursing note and vitals reviewed. Constitutional: She is oriented to person, place, and time. She appears well-developed and well-nourished. No distress.  HENT:  Head: Normocephalic and atraumatic.  Mouth/Throat: No oropharyngeal exudate.  Eyes: Conjunctivae and EOM are normal. Pupils are equal, round, and reactive to light. Right eye exhibits no  discharge. Left eye exhibits no discharge. No scleral icterus.  Neck: Normal range of motion. Neck supple. No tracheal deviation present.  Cardiovascular: Normal rate, regular rhythm, normal heart sounds and intact distal pulses.   Pulmonary/Chest: Effort normal and breath sounds normal. No stridor. No respiratory distress. She has no wheezes.  Abdominal: Soft. Bowel sounds are normal. She exhibits no distension and no mass. There is no tenderness. There is no rebound and no guarding.  Musculoskeletal: Normal range of motion. She exhibits no edema and no tenderness. Left arm and shoulder in a sling Neurological: She is alert and oriented to person, place, and time.  Skin: Skin is warm and dry. No rash noted. She is not diaphoretic. No erythema. No pallor.  Psychiatric: She has a normal mood and affect. Her behavior is normal. Judgment and thought content normal.    Data Reviewed   Assessment    Morbid obesity with a BMI of 51 She is a good candidate for weight loss surgery. She remains interested in a sleeve gastrectomy and we discussed the procedure and its risks. The risks of infection, bleeding, pain, scarring, weight regain, too little or too much weight loss, vitamin deficiencies and need for lifelong vitamin supplementation, hair loss, need for protein supplementation, leaks, stricture, reflux, food intolerance, need for reoperation and conversion to roux Y gastric bypass, need for open surgery, injury to spleen or surrounding structures, DVT's, PE, and death again discussed with the patient and the patient expressed understanding and desires to proceed with laparoscopic vertical sleeve gastrectomy, possible open, intraoperative endoscopy.  We also discussed the preoperative care and expectations.    Plan    We will plan for vertical sleeve gastrectomy as scheduled.       [place], [place] 07/15/2012, 1:16 PM

## 2012-07-15 NOTE — Progress Notes (Signed)
07/15/12 1355  OBSTRUCTIVE SLEEP APNEA  Have you ever been diagnosed with sleep apnea through a sleep study? No  Do you snore loudly (loud enough to be heard through closed doors)?  1  Do you often feel tired, fatigued, or sleepy during the daytime? 0  Has anyone observed you stop breathing during your sleep? 0  Do you have, or are you being treated for high blood pressure? 1  BMI more than 35 kg/m2? 1  Age over 48 years old? 0  Neck circumference greater than 40 cm/18 inches? 1  Gender: 0  Obstructive Sleep Apnea Score 4  Score 4 or greater  Results sent to PCP

## 2012-07-15 NOTE — Patient Instructions (Addendum)
YOUR SURGERY IS SCHEDULED AT [facility]  ON:  Tuesday  7/1  REPORT TO [facility] AT:   12:15 PM      PHONE # FOR SHORT STAY IS [phone]  FOLLOW INSTRUCTIONS FROM DR. LAYTON'S OFFICE FOR YOUR LIQUID DIET DAY BEFORE YOUR SURGERY.  DO NOT EAT  ANYTHING AFTER MIDNIGHT THE NIGHT BEFORE YOUR SURGERY.   NO FOOD, NO CHEWING GUM, NO MINTS, NO CANDIES, NO CHEWING TOBACCO. YOU MAY HAVE CLEAR LIQUIDS TO DRINK FROM MIDNIGHT UNTIL 8:30 AM DAY OF SURGERY - LIKE WATER, COFFEE, TEA  ( NO MILK OR MILK PRODUCTS)  -- THEN NOTHING TO DRINK AFTER 8:30 AM DAY OF YOUR SURGERY.  PLEASE TAKE THE FOLLOWING MEDICATIONS THE AM OF YOUR SURGERY WITH A FEW SIPS OF WATER:  HYDROCODONE/ACETAMINOPHEN IF NEEDED FOR PAIN, CRESTOR.    IF YOU ARE DIABETIC:  DO NOT TAKE ANY DIABETIC MEDICATIONS THE AM OF YOUR SURGERY.  IF YOU TAKE INSULIN IN THE EVENINGS--PLEASE ONLY TAKE 1/2 NORMAL EVENING DOSE THE NIGHT BEFORE YOUR SURGERY.  NO INSULIN THE AM OF YOUR SURGERY.   DO NOT BRING VALUABLES, MONEY, CREDIT CARDS.  DO NOT WEAR JEWELRY, MAKE-UP, NAIL POLISH AND NO METAL PINS OR CLIPS IN YOUR HAIR. CONTACT LENS, DENTURES / PARTIALS, GLASSES SHOULD NOT BE WORN TO SURGERY AND IN MOST CASES-HEARING AIDS WILL NEED TO BE REMOVED.  BRING YOUR GLASSES CASE, ANY EQUIPMENT NEEDED FOR YOUR CONTACT LENS. FOR PATIENTS ADMITTED TO THE HOSPITAL--CHECK OUT TIME THE DAY OF DISCHARGE IS 11:00 AM.  ALL INPATIENT ROOMS ARE PRIVATE - WITH BATHROOM, TELEPHONE, TELEVISION AND WIFI INTERNET.                               PLEASE READ OVER ANY  FACT SHEETS THAT YOU WERE GIVEN: MRSA INFORMATION FAILURE TO FOLLOW THESE INSTRUCTIONS MAY RESULT IN THE CANCELLATION OF YOUR SURGERY.   PATIENT SIGNATURE_________________________________

## 2012-07-15 NOTE — Pre-Procedure Instructions (Addendum)
EKG REPORT IN EPIC FROM 12/07/11. CXR WAS DONE TODAY - PREOP - AT [facility]. OFFICE NOTES AND LABS FROM DR. A. POWELL - NEPHROLOGIST FROM 04/11/12 ARE ON PT'S CHART.

## 2012-07-16 ENCOUNTER — Encounter (HOSPITAL_COMMUNITY): Payer: Self-pay | Admitting: Emergency Medicine

## 2012-07-16 ENCOUNTER — Emergency Department (HOSPITAL_COMMUNITY)
Admission: EM | Admit: 2012-07-16 | Discharge: 2012-07-16 | Disposition: A | Discharge status: Home / Self Care | Payer: BC Managed Care – PPO | Attending: Emergency Medicine | Admitting: Emergency Medicine

## 2012-07-16 DIAGNOSIS — I129 Hypertensive chronic kidney disease with stage 1 through stage 4 chronic kidney disease, or unspecified chronic kidney disease: Secondary | ICD-10-CM | POA: Insufficient documentation

## 2012-07-16 DIAGNOSIS — M25512 Pain in left shoulder: Secondary | ICD-10-CM

## 2012-07-16 DIAGNOSIS — Z8659 Personal history of other mental and behavioral disorders: Secondary | ICD-10-CM | POA: Insufficient documentation

## 2012-07-16 DIAGNOSIS — Z8639 Personal history of other endocrine, nutritional and metabolic disease: Secondary | ICD-10-CM | POA: Insufficient documentation

## 2012-07-16 DIAGNOSIS — N183 Chronic kidney disease, stage 3 unspecified: Secondary | ICD-10-CM | POA: Insufficient documentation

## 2012-07-16 DIAGNOSIS — M25519 Pain in unspecified shoulder: Secondary | ICD-10-CM | POA: Insufficient documentation

## 2012-07-16 DIAGNOSIS — Z87891 Personal history of nicotine dependence: Secondary | ICD-10-CM | POA: Insufficient documentation

## 2012-07-16 DIAGNOSIS — Z794 Long term (current) use of insulin: Secondary | ICD-10-CM | POA: Insufficient documentation

## 2012-07-16 DIAGNOSIS — E119 Type 2 diabetes mellitus without complications: Secondary | ICD-10-CM | POA: Insufficient documentation

## 2012-07-16 DIAGNOSIS — Z862 Personal history of diseases of the blood and blood-forming organs and certain disorders involving the immune mechanism: Secondary | ICD-10-CM | POA: Insufficient documentation

## 2012-07-16 MED ORDER — OXYCODONE-ACETAMINOPHEN 5-325 MG PO TABS: 1.0000 | ORAL_TABLET | Freq: Four times a day (QID) | ORAL | Status: DC | PRN

## 2012-07-16 NOTE — ED Notes (Signed)
Pt. Stated, i went to [facility] on [facility] and they xray nothing showed up.  Put in a sling. I think I rolled on it while i was sleeping.

## 2012-07-16 NOTE — ED Provider Notes (Signed)
Medical screening examination/treatment/procedure(s) were performed by non-physician practitioner and as supervising physician I was immediately available for consultation/collaboration. Rolland Porter, MD, FACEP   Janice Norrie, MD 07/16/12 1310

## 2012-07-16 NOTE — ED Provider Notes (Signed)
History    CSN: [id number] Arrival date & time 07/16/12  1004  First MD Initiated Contact with Patient 07/16/12 1018     Chief Complaint  Patient presents with  . Shoulder Pain   (Consider location/radiation/quality/duration/timing/severity/associated sxs/prior Treatment) HPI Comments: Patient presents with complaint of gradual onset left shoulder pain over the past week. Patient was seen at urgent care 2 days ago and had a negative x-ray. She was prescribed Vicodin which he has been taking with some relief. She is also using a sling. Patient states that the pain was made worse yesterday during preop chest x-ray. She is scheduled to have gastric sleeve placement next week. She denies fever. Pain is worse with movement especially raising her arm above shoulder level.   Patient is a 47 y.o. female presenting with shoulder pain. The history is provided by the patient.  Shoulder Pain Associated symptoms include arthralgias. Pertinent negatives include no fever, joint swelling, neck pain, numbness or weakness.   Past Medical History  Diagnosis Date  . Hypertension   . Diabetes mellitus   . Gout   . Depression   . Gout   . Hyperlipidemia   . Blood transfusion without reported diagnosis   . Morbid obesity   . Chronic kidney disease     STAGE 3 CHRONIC KIDNEY DISEASE SECONDARY TO DIABETIC GLOMERULOSCLEROSIS AND UNCONTROLLED HYPERTENSION - PER OFFICE NOTES DR. Florene Glen -[facility].  Marland Kitchen Headache(784.0)     CHRONIC H/A'S  . Pain     LEFT SHOULDER PAIN - WAS SEEN AT AN URGENT CARE - GIVEN SLING FOR COMFORT AND TOLD ROM AS TOLERATED.   Past Surgical History  Procedure Laterality Date  . Cesarean section  1988, 1993  . Carpal tunnel release  2008   Family History  Problem Relation Age of Onset  . Hypertension Mother   . Diabetes Father   . Arthritis Other   . Hyperlipidemia Other   . Cancer Neg Hx   . Heart disease Neg Hx   . Kidney disease Neg Hx   . Stroke Neg Hx     History  Substance Use Topics  . Smoking status: Former Smoker -- 20 years    Types: Cigarettes  . Smokeless tobacco: Never Used     Comment: QUIT SMOKING ABOUT 2009  . Alcohol Use: 0.6 oz/week    1 Glasses of wine per week   OB History   Grav Para Term Preterm Abortions TAB SAB Ect Mult Living                 Review of Systems  Constitutional: Positive for activity change. Negative for fever.  HENT: Negative for neck pain.   Musculoskeletal: Positive for arthralgias. Negative for back pain and joint swelling.  Skin: Negative for wound.  Neurological: Negative for weakness and numbness.    Allergies  Amlodipine; Clonidine derivatives; and Welchol  Home Medications   Current Outpatient Rx  Name  Route  Sig  Dispense  Refill  . acetaminophen (TYLENOL) 500 MG tablet   Oral   Take 500 mg by mouth every 6 (six) hours as needed for pain.         Marland Kitchen HYDROcodone-acetaminophen (NORCO/VICODIN) 5-325 MG per tablet   Oral   Take 2 tablets by mouth every 4 (four) hours as needed for pain.         . Insulin Glargine (LANTUS SOLOSTAR) 100 UNIT/ML SOPN   Subcutaneous   Inject 60 Units into the skin at bedtime.          Marland Kitchen  Liraglutide (VICTOZA) 18 MG/3ML SOLN injection   Subcutaneous   Inject 1.8 mg into the skin every morning.          . magnesium hydroxide (MILK OF MAGNESIA) 400 MG/5ML suspension   Oral   Take 30 mLs by mouth daily as needed for constipation.         . Multiple Vitamins-Minerals (HAIR/SKIN/NAILS) TABS   Oral   Take 1 tablet by mouth daily.         . nebivolol (BYSTOLIC) 10 MG tablet   Oral   Take 10 mg by mouth every evening.          . olmesartan-hydrochlorothiazide (BENICAR HCT) 40-25 MG per tablet   Oral   Take 1 tablet by mouth every morning.         . rosuvastatin (CRESTOR) 20 MG tablet   Oral   Take 20 mg by mouth every morning.         Marland Kitchen oxyCODONE-acetaminophen (PERCOCET/ROXICET) 5-325 MG per tablet   Oral   Take 1-2  tablets by mouth every 6 (six) hours as needed for pain.   12 tablet   0    BP 170/111  Pulse 71  Temp(Src) 98.1 F (36.7 C)  Resp 18  SpO2 100% Physical Exam  Nursing note and vitals reviewed. Constitutional: She appears well-developed and well-nourished.  HENT:  Head: Normocephalic and atraumatic.  Eyes: Pupils are equal, round, and reactive to light.  Neck: Normal range of motion. Neck supple.  Cardiovascular: Exam reveals no decreased pulses.   Pulses:      Radial pulses are 2+ on the right side, and 2+ on the left side.  Musculoskeletal: She exhibits tenderness. She exhibits no edema.       Left shoulder: She exhibits decreased range of motion and tenderness (anterior, moderate). She exhibits no bony tenderness, no swelling and no deformity.       Left elbow: Normal.       Left wrist: Normal.       Left hand: Normal sensation noted. Normal strength noted.  Neurological: She is alert. No sensory deficit.  Motor, sensation, and vascular distal to the injury is fully intact.   Skin: Skin is warm and dry.  Psychiatric: She has a normal mood and affect.    ED Course  Procedures (including critical care time) Labs Reviewed - No data to display Dg Chest 2 View  07/15/2012   *RADIOLOGY REPORT*  Clinical Data: Preoperative evaluation for bariatric surgery. Hypertension and chronic renal disease. Diabetes  CHEST - 2 VIEW  Comparison: None.  Findings: Slightly low lung volumes are present.  Taking this into consideration heart and mediastinal contours are within normal limits.  The lung fields appear clear with no signs of focal infiltrate or congestive failure.  No pleural fluid or significant peribronchial cuffing is seen.  Bony structures appear intact  IMPRESSION: No worrisome focal or acute cardiopulmonary abnormality identified   Original Report Authenticated By: Ponciano Ort, M.D.   Dg Shoulder Left  07/14/2012   *RADIOLOGY REPORT*  Clinical Data: Left shoulder pain.  LEFT  SHOULDER - 2+ VIEW  Comparison: None  Findings: The joint spaces are maintained.  No acute bony findings. No abnormal soft tissue calcifications.  Left lung is clear.  IMPRESSION: No acute bony findings.   Original Report Authenticated By: Marijo Sanes, M.D.   1. Shoulder pain, acute, left    10:40 AM Patient seen and examined. Will give additional pain medication and orthopedic followup.  Patient encouraged to use a sling as needed for comfort.  Vital signs reviewed and are as follows: Filed Vitals:   07/16/12 1015  BP: 170/111  Pulse: 71  Temp: 98.1 F (36.7 C)  Resp: 18   Patient counseled on use of narcotic pain medications. Counseled not to combine these medications with others containing tylenol. Urged not to drink alcohol, drive, or perform any other activities that requires focus while taking these medications. The patient verbalizes understanding and agrees with the plan.    MDM  Progressively worsening shoulder pain. Previous x-rays negative. No indication for repeat x-ray at this time. No fever or suggestion of septic joint. Rice protocol indicated with orthopedic followup if not improving. Referral given.  Carlisle Cater, PA-C 07/16/12 1043

## 2012-07-18 NOTE — Pre-Procedure Instructions (Signed)
PT'S PREOP BMET REPORT ROUTED TO DR. Dyane Dustman EPIC IN BASKET FOR REVIEW OF ABNORMALS.  BUN AND CREAT ELEVATED - PT HAS CHRONIC KIDNEY DISEASE.

## 2012-07-19 ENCOUNTER — Inpatient Hospital Stay (HOSPITAL_COMMUNITY)
Admission: RE | Admit: 2012-07-19 | Discharge: 2012-07-22 | DRG: 292 | Disposition: A | Discharge status: Home / Self Care | Payer: BC Managed Care – PPO | Source: Ambulatory Visit | Attending: General Surgery | Admitting: General Surgery

## 2012-07-19 ENCOUNTER — Encounter (HOSPITAL_COMMUNITY): Payer: Self-pay | Admitting: Anesthesiology

## 2012-07-19 ENCOUNTER — Inpatient Hospital Stay (HOSPITAL_COMMUNITY): Payer: BC Managed Care – PPO | Admitting: Anesthesiology

## 2012-07-19 ENCOUNTER — Encounter (HOSPITAL_COMMUNITY): Payer: Self-pay | Admitting: *Deleted

## 2012-07-19 ENCOUNTER — Encounter (HOSPITAL_COMMUNITY)
Admission: RE | Disposition: A | Discharge status: Home / Self Care | Payer: Self-pay | Source: Ambulatory Visit | Attending: General Surgery

## 2012-07-19 DIAGNOSIS — F329 Major depressive disorder, single episode, unspecified: Secondary | ICD-10-CM | POA: Diagnosis present

## 2012-07-19 DIAGNOSIS — K3189 Other diseases of stomach and duodenum: Secondary | ICD-10-CM | POA: Diagnosis not present

## 2012-07-19 DIAGNOSIS — N058 Unspecified nephritic syndrome with other morphologic changes: Secondary | ICD-10-CM | POA: Diagnosis present

## 2012-07-19 DIAGNOSIS — N186 End stage renal disease: Secondary | ICD-10-CM | POA: Diagnosis present

## 2012-07-19 DIAGNOSIS — E119 Type 2 diabetes mellitus without complications: Secondary | ICD-10-CM

## 2012-07-19 DIAGNOSIS — R1013 Epigastric pain: Secondary | ICD-10-CM | POA: Diagnosis not present

## 2012-07-19 DIAGNOSIS — I10 Essential (primary) hypertension: Secondary | ICD-10-CM

## 2012-07-19 DIAGNOSIS — F3289 Other specified depressive episodes: Secondary | ICD-10-CM | POA: Diagnosis present

## 2012-07-19 DIAGNOSIS — N183 Chronic kidney disease, stage 3 unspecified: Secondary | ICD-10-CM

## 2012-07-19 DIAGNOSIS — E1165 Type 2 diabetes mellitus with hyperglycemia: Secondary | ICD-10-CM | POA: Diagnosis present

## 2012-07-19 DIAGNOSIS — Z6841 Body Mass Index (BMI) 40.0 and over, adult: Secondary | ICD-10-CM

## 2012-07-19 DIAGNOSIS — E785 Hyperlipidemia, unspecified: Secondary | ICD-10-CM | POA: Diagnosis present

## 2012-07-19 DIAGNOSIS — M109 Gout, unspecified: Secondary | ICD-10-CM | POA: Diagnosis present

## 2012-07-19 DIAGNOSIS — I129 Hypertensive chronic kidney disease with stage 1 through stage 4 chronic kidney disease, or unspecified chronic kidney disease: Secondary | ICD-10-CM | POA: Diagnosis present

## 2012-07-19 DIAGNOSIS — N184 Chronic kidney disease, stage 4 (severe): Secondary | ICD-10-CM | POA: Diagnosis present

## 2012-07-19 DIAGNOSIS — E1129 Type 2 diabetes mellitus with other diabetic kidney complication: Secondary | ICD-10-CM

## 2012-07-19 HISTORY — PX: UPPER GI ENDOSCOPY: SHX6162

## 2012-07-19 HISTORY — PX: LAPAROSCOPIC GASTRIC SLEEVE RESECTION: SHX5895

## 2012-07-19 LAB — GLUCOSE, CAPILLARY
Glucose-Capillary: 92 mg/dL (ref 70–99)
Glucose-Capillary: 149 mg/dL — ABNORMAL HIGH (ref 70–99)
Glucose-Capillary: 200 mg/dL — ABNORMAL HIGH (ref 70–99)

## 2012-07-19 SURGERY — GASTRECTOMY, SLEEVE, LAPAROSCOPIC
Anesthesia: General | Site: Esophagus | Wound class: Clean Contaminated

## 2012-07-19 MED ORDER — KETOROLAC TROMETHAMINE 30 MG/ML IJ SOLN: 15.0000 mg | Freq: Once | INTRAMUSCULAR | Status: DC | PRN

## 2012-07-19 MED ORDER — UNJURY CHOCOLATE CLASSIC POWDER: 2.0000 [oz_av] | Freq: Four times a day (QID) | ORAL | Status: DC

## 2012-07-19 MED ORDER — PANTOPRAZOLE SODIUM 40 MG IV SOLR
40.0000 mg | INTRAVENOUS | Status: DC
  Administered 2012-07-19 – 2012-07-20 (×2): 40 mg via INTRAVENOUS
  Filled 2012-07-19 (×4): qty 40

## 2012-07-19 MED ORDER — TISSEEL VH 10 ML EX KIT
PACK | CUTANEOUS | Status: DC | PRN
  Administered 2012-07-19: 1

## 2012-07-19 MED ORDER — SODIUM CHLORIDE 0.9 % IV SOLN
1.0000 g | INTRAVENOUS | Status: AC
  Administered 2012-07-19: 1 g via INTRAVENOUS

## 2012-07-19 MED ORDER — MORPHINE SULFATE 2 MG/ML IJ SOLN
2.0000 mg | INTRAMUSCULAR | Status: DC | PRN
  Administered 2012-07-19: 2 mg via INTRAVENOUS
  Administered 2012-07-20 (×4): 4 mg via INTRAVENOUS
  Filled 2012-07-19 (×3): qty 2
  Filled 2012-07-19: qty 1
  Filled 2012-07-19: qty 2

## 2012-07-19 MED ORDER — DEXAMETHASONE SODIUM PHOSPHATE 10 MG/ML IJ SOLN
INTRAMUSCULAR | Status: DC | PRN
  Administered 2012-07-19: 10 mg via INTRAVENOUS

## 2012-07-19 MED ORDER — PROMETHAZINE HCL 25 MG/ML IJ SOLN: 6.2500 mg | INTRAMUSCULAR | Status: DC | PRN

## 2012-07-19 MED ORDER — UNJURY VANILLA POWDER
2.0000 [oz_av] | Freq: Four times a day (QID) | ORAL | Status: DC
  Administered 2012-07-21 (×3): 2 [oz_av] via ORAL

## 2012-07-19 MED ORDER — CISATRACURIUM BESYLATE (PF) 10 MG/5ML IV SOLN
INTRAVENOUS | Status: DC | PRN
  Administered 2012-07-19: 12 mg via INTRAVENOUS
  Administered 2012-07-19: 3 mg via INTRAVENOUS

## 2012-07-19 MED ORDER — NEOSTIGMINE METHYLSULFATE 1 MG/ML IJ SOLN
INTRAMUSCULAR | Status: DC | PRN
  Administered 2012-07-19: 5 mg via INTRAVENOUS

## 2012-07-19 MED ORDER — HEPARIN SODIUM (PORCINE) 5000 UNIT/ML IJ SOLN
5000.0000 [IU] | Freq: Three times a day (TID) | INTRAMUSCULAR | Status: DC
  Administered 2012-07-20 – 2012-07-22 (×6): 5000 [IU] via SUBCUTANEOUS
  Filled 2012-07-19 (×9): qty 1

## 2012-07-19 MED ORDER — PROPOFOL 10 MG/ML IV BOLUS
INTRAVENOUS | Status: DC | PRN
  Administered 2012-07-19: 200 mg via INTRAVENOUS

## 2012-07-19 MED ORDER — GLYCOPYRROLATE 0.2 MG/ML IJ SOLN
INTRAMUSCULAR | Status: DC | PRN
  Administered 2012-07-19: .8 mg via INTRAVENOUS

## 2012-07-19 MED ORDER — ENOXAPARIN SODIUM 40 MG/0.4ML ~~LOC~~ SOLN: 40.0000 mg | Freq: Two times a day (BID) | SUBCUTANEOUS | Status: DC

## 2012-07-19 MED ORDER — SODIUM CHLORIDE 0.9 % IV SOLN: INTRAVENOUS | Status: DC

## 2012-07-19 MED ORDER — BUPIVACAINE HCL 0.25 % IJ SOLN
INTRAMUSCULAR | Status: DC | PRN
  Administered 2012-07-19: 21 mL

## 2012-07-19 MED ORDER — NEBIVOLOL HCL 10 MG PO TABS
10.0000 mg | ORAL_TABLET | Freq: Every day | ORAL | Status: DC
  Administered 2012-07-19 – 2012-07-21 (×3): 10 mg via ORAL
  Filled 2012-07-19 (×5): qty 1

## 2012-07-19 MED ORDER — HYDROMORPHONE HCL PF 1 MG/ML IJ SOLN
0.2500 mg | INTRAMUSCULAR | Status: DC | PRN
  Administered 2012-07-19: 0.5 mg via INTRAVENOUS
  Administered 2012-07-19: 0.25 mg via INTRAVENOUS
  Administered 2012-07-19: 0.5 mg via INTRAVENOUS

## 2012-07-19 MED ORDER — LACTATED RINGERS IR SOLN
Status: DC | PRN
  Administered 2012-07-19: 3000 mL

## 2012-07-19 MED ORDER — FENTANYL CITRATE 0.05 MG/ML IJ SOLN
INTRAMUSCULAR | Status: DC | PRN
  Administered 2012-07-19: 50 ug via INTRAVENOUS
  Administered 2012-07-19: 100 ug via INTRAVENOUS
  Administered 2012-07-19: 50 ug via INTRAVENOUS

## 2012-07-19 MED ORDER — LIDOCAINE-EPINEPHRINE 1 %-1:100000 IJ SOLN
INTRAMUSCULAR | Status: DC | PRN
  Administered 2012-07-19: 21 mL

## 2012-07-19 MED ORDER — ONDANSETRON HCL 4 MG/2ML IJ SOLN
INTRAMUSCULAR | Status: DC | PRN
  Administered 2012-07-19: 4 mg via INTRAVENOUS

## 2012-07-19 MED ORDER — HEPARIN SODIUM (PORCINE) 5000 UNIT/ML IJ SOLN
5000.0000 [IU] | Freq: Once | INTRAMUSCULAR | Status: AC
  Administered 2012-07-19: 5000 [IU] via SUBCUTANEOUS
  Filled 2012-07-19: qty 1

## 2012-07-19 MED ORDER — UNJURY CHICKEN SOUP POWDER: 2.0000 [oz_av] | Freq: Four times a day (QID) | ORAL | Status: DC

## 2012-07-19 MED ORDER — SODIUM CHLORIDE 0.9 % IV SOLN
INTRAVENOUS | Status: DC | PRN
  Administered 2012-07-19 (×2): via INTRAVENOUS

## 2012-07-19 MED ORDER — PNEUMOCOCCAL VAC POLYVALENT 25 MCG/0.5ML IJ INJ
0.5000 mL | INJECTION | INTRAMUSCULAR | Status: AC
  Administered 2012-07-20: 0.5 mL via INTRAMUSCULAR
  Filled 2012-07-19 (×2): qty 0.5

## 2012-07-19 MED ORDER — 0.9 % SODIUM CHLORIDE (POUR BTL) OPTIME
TOPICAL | Status: DC | PRN
  Administered 2012-07-19: 1000 mL

## 2012-07-19 MED ORDER — INSULIN ASPART 100 UNIT/ML ~~LOC~~ SOLN
0.0000 [IU] | SUBCUTANEOUS | Status: DC
  Administered 2012-07-19: 3 [IU] via SUBCUTANEOUS
  Administered 2012-07-20: 2 [IU] via SUBCUTANEOUS
  Administered 2012-07-20: 3 [IU] via SUBCUTANEOUS

## 2012-07-19 MED ORDER — OXYCODONE-ACETAMINOPHEN 5-325 MG/5ML PO SOLN
5.0000 mL | ORAL | Status: DC | PRN
  Administered 2012-07-20: 10 mL via ORAL
  Administered 2012-07-21 – 2012-07-22 (×5): 5 mL via ORAL
  Filled 2012-07-19 (×4): qty 5
  Filled 2012-07-19: qty 10
  Filled 2012-07-19: qty 5

## 2012-07-19 MED ORDER — LACTATED RINGERS IV SOLN
INTRAVENOUS | Status: DC | PRN
  Administered 2012-07-19: 14:00:00 via INTRAVENOUS

## 2012-07-19 MED ORDER — MIDAZOLAM HCL 5 MG/5ML IJ SOLN
INTRAMUSCULAR | Status: DC | PRN
  Administered 2012-07-19: 2 mg via INTRAVENOUS

## 2012-07-19 MED ORDER — SODIUM CHLORIDE 0.9 % IV SOLN
INTRAVENOUS | Status: DC
  Administered 2012-07-19: 125 mL via INTRAVENOUS
  Administered 2012-07-19: 18:00:00 via INTRAVENOUS
  Administered 2012-07-20 (×2): 125 mL via INTRAVENOUS
  Administered 2012-07-21: 12:00:00 via INTRAVENOUS
  Administered 2012-07-21: 125 mL via INTRAVENOUS
  Administered 2012-07-22: via INTRAVENOUS

## 2012-07-19 MED ORDER — ONDANSETRON HCL 4 MG/2ML IJ SOLN
4.0000 mg | INTRAMUSCULAR | Status: DC | PRN
  Administered 2012-07-19 – 2012-07-20 (×2): 4 mg via INTRAVENOUS
  Filled 2012-07-19 (×2): qty 2

## 2012-07-19 SURGICAL SUPPLY — 62 items
ADH SKN CLS APL DERMABOND .7 (GAUZE/BANDAGES/DRESSINGS) ×3
APL SRG 32X5 SNPLK LF DISP (MISCELLANEOUS) ×3
APPLICATOR COTTON TIP 6IN STRL (MISCELLANEOUS) IMPLANT
APPLIER CLIP ROT 10 11.4 M/L (STAPLE) ×4
APR CLP MED LRG 11.4X10 (STAPLE) ×3
BAG SPEC RTRVL LRG 6X4 10 (ENDOMECHANICALS)
CABLE HIGH FREQUENCY MONO STRZ (ELECTRODE) ×2 IMPLANT
CANISTER SUCTION 2500CC (MISCELLANEOUS) ×6 IMPLANT
CHLORAPREP W/TINT 26ML (MISCELLANEOUS) ×8 IMPLANT
CLIP APPLIE ROT 10 11.4 M/L (STAPLE) ×1 IMPLANT
CLOTH BEACON ORANGE TIMEOUT ST (SAFETY) ×4 IMPLANT
DERMABOND ADVANCED (GAUZE/BANDAGES/DRESSINGS) ×1
DERMABOND ADVANCED .7 DNX12 (GAUZE/BANDAGES/DRESSINGS) ×1 IMPLANT
DEVICE SUTURE ENDOST 10MM (ENDOMECHANICALS) IMPLANT
DEVICE TROCAR PUNCTURE CLOSURE (ENDOMECHANICALS) ×2 IMPLANT
DRAIN CHANNEL 19F RND (DRAIN) ×4 IMPLANT
DRAPE LAPAROSCOPIC ABDOMINAL (DRAPES) ×4 IMPLANT
DRAPE UTILITY 15X26 (DRAPE) ×8 IMPLANT
DRAPE UTILITY XL STRL (DRAPES) ×4 IMPLANT
ELECT REM PT RETURN 9FT ADLT (ELECTROSURGICAL) ×4
ELECTRODE REM PT RTRN 9FT ADLT (ELECTROSURGICAL) ×3 IMPLANT
EVACUATOR DRAINAGE 10X20 100CC (DRAIN) ×3 IMPLANT
EVACUATOR SILICONE 100CC (DRAIN) ×6 IMPLANT
GLOVE SURG SS PI 7.5 STRL IVOR (GLOVE) ×8 IMPLANT
GLOVE SURG SS PI 8.5 STRL IVOR (GLOVE) ×2
GLOVE SURG SS PI 8.5 STRL STRW (GLOVE) ×4 IMPLANT
GOWN STRL NON-REIN LRG LVL3 (GOWN DISPOSABLE) ×2 IMPLANT
GOWN STRL REIN 2XL XLG LVL4 (GOWN DISPOSABLE) ×4 IMPLANT
GOWN STRL REIN XL XLG (GOWN DISPOSABLE) ×18 IMPLANT
HANDLE STAPLE EGIA 4 XL (STAPLE) ×3 IMPLANT
HOVERMATT SINGLE USE (MISCELLANEOUS) ×4 IMPLANT
KIT BASIN OR (CUSTOM PROCEDURE TRAY) ×4 IMPLANT
MARKER SKIN DUAL TIP RULER LAB (MISCELLANEOUS) ×2 IMPLANT
NDL SPNL 22GX3.5 QUINCKE BK (NEEDLE) ×1 IMPLANT
NEEDLE SPNL 22GX3.5 QUINCKE BK (NEEDLE) ×4 IMPLANT
NS IRRIG 1000ML POUR BTL (IV SOLUTION) ×4 IMPLANT
PENCIL BUTTON HOLSTER BLD 10FT (ELECTRODE) ×4 IMPLANT
POUCH SPECIMEN RETRIEVAL 10MM (ENDOMECHANICALS) IMPLANT
RELOAD EGIA 60 MED/THCK PURPLE (STAPLE) ×16 IMPLANT
RELOAD STAPLE 60 BLK XTHK ART (STAPLE) IMPLANT
RELOAD STAPLE 60 MED/THCK ART (STAPLE) IMPLANT
RELOAD TRI 2.0 60 XTHK VAS SUL (STAPLE) ×8 IMPLANT
SCISSORS LAP 5X35 DISP (ENDOMECHANICALS) ×3 IMPLANT
SEALANT SURGICAL APPL DUAL CAN (MISCELLANEOUS) ×4 IMPLANT
SET IRRIG TUBING LAPAROSCOPIC (IRRIGATION / IRRIGATOR) ×4 IMPLANT
SHEARS CURVED HARMONIC AC 45CM (MISCELLANEOUS) ×4 IMPLANT
SLEEVE ENDOPATH XCEL 5M (ENDOMECHANICALS) ×12 IMPLANT
SOLUTION ANTI FOG 6CC (MISCELLANEOUS) ×4 IMPLANT
SPONGE GAUZE 4X4 12PLY (GAUZE/BANDAGES/DRESSINGS) IMPLANT
SPONGE LAP 18X18 X RAY DECT (DISPOSABLE) ×4 IMPLANT
SUT ETHILON 2 0 PS N (SUTURE) ×4 IMPLANT
SUT MNCRL AB 4-0 PS2 18 (SUTURE) ×8 IMPLANT
SUT VICRYL 0 UR6 27IN ABS (SUTURE) ×7 IMPLANT
SYR 50ML LL SCALE MARK (SYRINGE) ×4 IMPLANT
TOWEL OR NON WOVEN STRL DISP B (DISPOSABLE) ×2 IMPLANT
TRAY FOLEY CATH 14FRSI W/METER (CATHETERS) ×4 IMPLANT
TRAY LAP CHOLE (CUSTOM PROCEDURE TRAY) ×4 IMPLANT
TROCAR BLADELESS 15MM (ENDOMECHANICALS) ×4 IMPLANT
TROCAR BLADELESS OPT 5 100 (ENDOMECHANICALS) ×4 IMPLANT
TUBING CONNECTING 10 (TUBING) ×7 IMPLANT
TUBING ENDO SMARTCAP (MISCELLANEOUS) ×4 IMPLANT
TUBING FILTER THERMOFLATOR (ELECTROSURGICAL) ×4 IMPLANT

## 2012-07-19 NOTE — Anesthesia Postprocedure Evaluation (Signed)
  Anesthesia Post-op Note  Patient: Brenda Davis  Procedure(s) Performed: Procedure(s) (LRB): LAPAROSCOPIC SLEEVE GASTRECTOMY with EGD (N/A) UPPER GI ENDOSCOPY (N/A)  Patient Location: PACU  Anesthesia Type: General  Level of Consciousness: awake and alert   Airway and Oxygen Therapy: Patient Spontanous Breathing  Post-op Pain: mild  Post-op Assessment: Post-op Vital signs reviewed, Patient's Cardiovascular Status Stable, Respiratory Function Stable, Patent Airway and No signs of Nausea or vomiting  Last Vitals:  Filed Vitals:   07/19/12 1715  BP: 102/45  Pulse: 71  Temp: 36.4 C  Resp: 12    Post-op Vital Signs: stable   Complications: No apparent anesthesia complications

## 2012-07-19 NOTE — Anesthesia Preprocedure Evaluation (Addendum)
Anesthesia Evaluation  Patient identified by MRN, date of birth, ID band Patient awake    Reviewed: Allergy & Precautions, H&P , NPO status , Patient's Chart, lab work & pertinent test results  Airway Mallampati: III TM Distance: <3 FB Neck ROM: Full    Dental no notable dental hx.    Pulmonary neg pulmonary ROS,  breath sounds clear to auscultation  Pulmonary exam normal       Cardiovascular hypertension, Pt. on medications Rhythm:Regular Rate:Normal     Neuro/Psych negative neurological ROS  negative psych ROS   GI/Hepatic negative GI ROS, Neg liver ROS,   Endo/Other  diabetes, Insulin DependentMorbid obesity  Renal/GU CRFRenal disease  negative genitourinary   Musculoskeletal negative musculoskeletal ROS (+)   Abdominal   Peds negative pediatric ROS (+)  Hematology negative hematology ROS (+)   Anesthesia Other Findings   Reproductive/Obstetrics negative OB ROS                          Anesthesia Physical Anesthesia Plan  ASA: III  Anesthesia Plan: General   Post-op Pain Management:    Induction: Intravenous  Airway Management Planned: Oral ETT  Additional Equipment:   Intra-op Plan:   Post-operative Plan: Extubation in OR  Informed Consent: I have reviewed the patients History and Physical, chart, labs and discussed the procedure including the risks, benefits and alternatives for the proposed anesthesia with the patient or authorized representative who has indicated his/her understanding and acceptance.   Dental advisory given  Plan Discussed with: CRNA and Surgeon  Anesthesia Plan Comments:         Anesthesia Quick Evaluation

## 2012-07-19 NOTE — Transfer of Care (Signed)
Immediate Anesthesia Transfer of Care Note  Patient: Brenda Davis  Procedure(s) Performed: Procedure(s) with comments: LAPAROSCOPIC SLEEVE GASTRECTOMY with EGD (N/A) - laparoscopic sleeve gastrectomy with EGD UPPER GI ENDOSCOPY (N/A)  Patient Location: PACU  Anesthesia Type:General  Level of Consciousness: awake, alert , oriented and patient cooperative  Airway & Oxygen Therapy: Patient Spontanous Breathing and Patient connected to face mask oxygen  Post-op Assessment: Report given to PACU RN and Post -op Vital signs reviewed and stable  Post vital signs: Reviewed and stable  Complications: No apparent anesthesia complications

## 2012-07-19 NOTE — Interval H&P Note (Signed)
History and Physical Interval Note:  07/19/2012 1:52 PM  Brenda Davis  has presented today for surgery, with the diagnosis of morbid obesity  The various methods of treatment have been discussed with the patient and family. After consideration of risks, benefits and other options for treatment, the patient has consented to  Procedure(s) with comments: [place] with EGD (N/A) - laparoscopic sleeve gastrectomy with EGD LAPAROSCOPIC SLEEVE GASTRECTOMY with EGD (N/A) as a surgical intervention .  The patient's history has been reviewed, patient examined, no change in status, stable for surgery.  I have reviewed the patient's chart and labs.  Questions were answered to the patient's satisfaction.  I have seen and evaluated her in the preop area.  Risks of the procedure again discussed in lay terms.  The risks of infection, bleeding, pain, scarring, weight regain, too little or too much weight loss, vitamin deficiencies and need for lifelong vitamin supplementation, hair loss, need for protein supplementation, leaks, stricture, reflux, food intolerance, need for reoperation and conversion to roux Y gastric bypass, need for open surgery, injury to spleen or surrounding structures, DVT's, PE, and death again discussed with the patient and the patient expressed understanding and desires to proceed with laparoscopic vertical sleeve gastrectomy, possible open, intraoperative endoscopy.  Her creatinine is normally elevated and was 3.0, 2 months ago.  Now 3.1.  I have placed a call to her nephrologist, Dr. Florene Glen who made the initial referral and he says that he would still recommend proceeding with weight loss surgery.    [place], [place]

## 2012-07-19 NOTE — Brief Op Note (Signed)
07/19/2012  4:28 PM  PATIENT:  Brenda Davis  47 y.o. female  PRE-OPERATIVE DIAGNOSIS:  morbid obesity  POST-OPERATIVE DIAGNOSIS:  morbid obesity  PROCEDURE:  Procedure(s) with comments: [place] with EGD (N/A) - laparoscopic sleeve gastrectomy with EGD UPPER GI ENDOSCOPY (N/A)  SURGEON:  Surgeon(s) and Role:    * Pedro Earls, MD - Assisting    * Madilyn Hook, DO - Primary  PHYSICIAN ASSISTANT:   ASSISTANTS: martin    ANESTHESIA:   general  EBL:  Total I/O In: 1500 [I.V.:1500] Out: 275 [Urine:225; Blood:50]  BLOOD ADMINISTERED:none  DRAINS: (39F) Jackson-Pratt drain(s) with closed bulb suction in the sleeve staple line   LOCAL MEDICATIONS USED:  MARCAINE    and LIDOCAINE   SPECIMEN:  Source of Specimen:  stomach  DISPOSITION OF SPECIMEN:  PATHOLOGY  COUNTS:  YES  TOURNIQUET:  * No tourniquets in log *  DICTATION: .Other Dictation: Dictation Number dictated  PLAN OF CARE: Admit to inpatient   PATIENT DISPOSITION:  PACU - hemodynamically stable.   Delay start of Pharmacological VTE agent (>24hrs) due to surgical blood loss or risk of bleeding: no

## 2012-07-19 NOTE — H&P (View-Only) (Signed)
Patient ID: Brenda Davis, female   DOB: [birth year], 47 y.o.   MRN: [id number]  Chief Complaint  Patient presents with  . Bariatric Pre-op    HPI Brenda Davis is a 47 y.o. female.  This patient comes in today for her preoperative surgery evaluation in preparation for vertical sleeve gastrectomy. She has BMI of 51 at with obesity related comorbidities of diabetes mellitus hypertension hyperlipidemia and gout. She remains most interested in her vertical sleeve gastrectomy. She has a sister who had the procedure done at another facility 3 years ago and has done very well from her procedure. She has a tender information session and has had a normal upper GI. She has been cleared by nutrition and psychology. She denies any significant changes from our last visit except recent left shoulder pain for which she was evaluated in the urgent care and she was placed in a shoulder sling. She says the x-rays were negative. HPI  Past Medical History  Diagnosis Date  . Hypertension   . Diabetes mellitus   . Gout   . Depression   . Gout   . Hyperlipidemia   . Blood transfusion without reported diagnosis   . Morbid obesity   . Chronic kidney disease     STAGE 3 CHRONIC KIDNEY DISEASE SECONDARY TO DIABETIC GLOMERULOSCLEROSIS AND UNCONTROLLED HYPERTENSION - PER OFFICE NOTES DR. Florene Glen -[facility].  Marland Kitchen Headache(784.0)     CHRONIC H/A'S    Past Surgical History  Procedure Laterality Date  . Cesarean section  1988, 1993  . Carpal tunnel release  2008    Family History  Problem Relation Age of Onset  . Hypertension Mother   . Diabetes Father   . Arthritis Other   . Hyperlipidemia Other   . Cancer Neg Hx   . Heart disease Neg Hx   . Kidney disease Neg Hx   . Stroke Neg Hx     Social History History  Substance Use Topics  . Smoking status: Former Smoker -- 20 years    Types: Cigarettes  . Smokeless tobacco: Never Used     Comment: QUIT SMOKING ABOUT 2009  . Alcohol Use: 0.6  oz/week    1 Glasses of wine per week    Allergies  Allergen Reactions  . Amlodipine     edema  . Clonidine Derivatives Swelling  . Welchol (Colesevelam Hcl) Nausea Only    Current Outpatient Prescriptions  Medication Sig Dispense Refill  . acetaminophen (TYLENOL) 500 MG tablet Take 500 mg by mouth every 6 (six) hours as needed for pain.      Marland Kitchen HYDROcodone-acetaminophen (NORCO/VICODIN) 5-325 MG per tablet Take 2 tablets by mouth every 4 (four) hours as needed for pain.  15 tablet  0  . Insulin Glargine (LANTUS SOLOSTAR) 100 UNIT/ML SOPN Inject 60 Units into the skin daily.      . Liraglutide (VICTOZA) 18 MG/3ML SOLN Inject 0.3 mLs (1.8 mg total) into the skin daily.  6 mL  11  . magnesium hydroxide (MILK OF MAGNESIA) 400 MG/5ML suspension Take 30 mLs by mouth daily as needed for constipation.      . Multiple Vitamins-Minerals (HAIR/SKIN/NAILS) TABS Take 1 tablet by mouth daily.      . nebivolol (BYSTOLIC) 10 MG tablet Take 10 mg by mouth every evening.       . olmesartan-hydrochlorothiazide (BENICAR HCT) 40-25 MG per tablet Take 1 tablet by mouth every morning.      . rosuvastatin (CRESTOR) 20 MG tablet Take 20  mg by mouth every morning.       No current facility-administered medications for this visit.    Review of Systems Review of Systems All other review of systems negative or noncontributory except as stated in the HPI  Blood pressure 132/86, pulse 72, temperature 97.8 F (36.6 C), temperature source Temporal, resp. rate 16, height 5\' 4"  (1.626 m), weight 296 lb 12.8 oz (134.628 kg).  Physical Exam Physical Exam Physical Exam  Nursing note and vitals reviewed. Constitutional: She is oriented to person, place, and time. She appears well-developed and well-nourished. No distress.  HENT:  Head: Normocephalic and atraumatic.  Mouth/Throat: No oropharyngeal exudate.  Eyes: Conjunctivae and EOM are normal. Pupils are equal, round, and reactive to light. Right eye exhibits no  discharge. Left eye exhibits no discharge. No scleral icterus.  Neck: Normal range of motion. Neck supple. No tracheal deviation present.  Cardiovascular: Normal rate, regular rhythm, normal heart sounds and intact distal pulses.   Pulmonary/Chest: Effort normal and breath sounds normal. No stridor. No respiratory distress. She has no wheezes.  Abdominal: Soft. Bowel sounds are normal. She exhibits no distension and no mass. There is no tenderness. There is no rebound and no guarding.  Musculoskeletal: Normal range of motion. She exhibits no edema and no tenderness. Left arm and shoulder in a sling Neurological: She is alert and oriented to person, place, and time.  Skin: Skin is warm and dry. No rash noted. She is not diaphoretic. No erythema. No pallor.  Psychiatric: She has a normal mood and affect. Her behavior is normal. Judgment and thought content normal.    Data Reviewed   Assessment    Morbid obesity with a BMI of 51 She is a good candidate for weight loss surgery. She remains interested in a sleeve gastrectomy and we discussed the procedure and its risks. The risks of infection, bleeding, pain, scarring, weight regain, too little or too much weight loss, vitamin deficiencies and need for lifelong vitamin supplementation, hair loss, need for protein supplementation, leaks, stricture, reflux, food intolerance, need for reoperation and conversion to roux Y gastric bypass, need for open surgery, injury to spleen or surrounding structures, DVT's, PE, and death again discussed with the patient and the patient expressed understanding and desires to proceed with laparoscopic vertical sleeve gastrectomy, possible open, intraoperative endoscopy.  We also discussed the preoperative care and expectations.    Plan    We will plan for vertical sleeve gastrectomy as scheduled.       [place], [place] 07/15/2012, 1:16 PM

## 2012-07-20 ENCOUNTER — Encounter (HOSPITAL_COMMUNITY): Payer: Self-pay | Admitting: General Surgery

## 2012-07-20 DIAGNOSIS — E1129 Type 2 diabetes mellitus with other diabetic kidney complication: Secondary | ICD-10-CM

## 2012-07-20 DIAGNOSIS — N183 Chronic kidney disease, stage 3 unspecified: Secondary | ICD-10-CM

## 2012-07-20 DIAGNOSIS — I10 Essential (primary) hypertension: Secondary | ICD-10-CM

## 2012-07-20 LAB — COMPREHENSIVE METABOLIC PANEL
ALT: 26 U/L (ref 0–35)
AST: 26 U/L (ref 0–37)
Albumin: 2.8 g/dL — ABNORMAL LOW (ref 3.5–5.2)
Alkaline Phosphatase: 84 U/L (ref 39–117)
BUN: 59 mg/dL — ABNORMAL HIGH (ref 6–23)
CO2: 21 mEq/L (ref 19–32)
Calcium: 9.6 mg/dL (ref 8.4–10.5)
Chloride: 104 mEq/L (ref 96–112)
Creatinine, Ser: 2.84 mg/dL — ABNORMAL HIGH (ref 0.50–1.10)
GFR calc Af Amer: 22 mL/min — ABNORMAL LOW (ref >90)
GFR calc non Af Amer: 19 mL/min — ABNORMAL LOW (ref >90)
Glucose, Bld: 140 mg/dL — ABNORMAL HIGH (ref 70–99)
Potassium: 5.5 mEq/L — ABNORMAL HIGH (ref 3.5–5.1)
Sodium: 137 mEq/L (ref 135–145)
Total Bilirubin: 0.1 mg/dL — ABNORMAL LOW (ref 0.3–1.2)
Total Protein: 7.3 g/dL (ref 6.0–8.3)

## 2012-07-20 LAB — GLUCOSE, CAPILLARY
Glucose-Capillary: 104 mg/dL — ABNORMAL HIGH (ref 70–99)
Glucose-Capillary: 104 mg/dL — ABNORMAL HIGH (ref 70–99)
Glucose-Capillary: 107 mg/dL — ABNORMAL HIGH (ref 70–99)
Glucose-Capillary: 110 mg/dL — ABNORMAL HIGH (ref 70–99)
Glucose-Capillary: 114 mg/dL — ABNORMAL HIGH (ref 70–99)
Glucose-Capillary: 129 mg/dL — ABNORMAL HIGH (ref 70–99)
Glucose-Capillary: 154 mg/dL — ABNORMAL HIGH (ref 70–99)

## 2012-07-20 LAB — CBC WITH DIFFERENTIAL/PLATELET
Basophils Absolute: 0 10*3/uL (ref 0.0–0.1)
Basophils Relative: 0 % (ref 0–1)
Eosinophils Absolute: 0 10*3/uL (ref 0.0–0.7)
Eosinophils Relative: 0 % (ref 0–5)
HCT: 32.4 % — ABNORMAL LOW (ref 36.0–46.0)
Hemoglobin: 10.5 g/dL — ABNORMAL LOW (ref 12.0–15.0)
Lymphocytes Relative: 9 % — ABNORMAL LOW (ref 12–46)
Lymphs Abs: 0.9 10*3/uL (ref 0.7–4.0)
MCH: 27.3 pg (ref 26.0–34.0)
MCHC: 32.4 g/dL (ref 30.0–36.0)
MCV: 84.4 fL (ref 78.0–100.0)
Monocytes Absolute: 0.3 10*3/uL (ref 0.1–1.0)
Monocytes Relative: 3 % (ref 3–12)
Neutro Abs: 8.6 10*3/uL — ABNORMAL HIGH (ref 1.7–7.7)
Neutrophils Relative %: 88 % — ABNORMAL HIGH (ref 43–77)
Platelets: 342 10*3/uL (ref 150–400)
RBC: 3.84 MIL/uL — ABNORMAL LOW (ref 3.87–5.11)
RDW: 13.1 % (ref 11.5–15.5)
WBC: 9.8 10*3/uL (ref 4.0–10.5)

## 2012-07-20 LAB — BASIC METABOLIC PANEL
BUN: 57 mg/dL — ABNORMAL HIGH (ref 6–23)
CO2: 19 mEq/L (ref 19–32)
Calcium: 9.6 mg/dL (ref 8.4–10.5)
Chloride: 104 mEq/L (ref 96–112)
Creatinine, Ser: 2.92 mg/dL — ABNORMAL HIGH (ref 0.50–1.10)
GFR calc Af Amer: 21 mL/min — ABNORMAL LOW (ref >90)
GFR calc non Af Amer: 18 mL/min — ABNORMAL LOW (ref >90)
Glucose, Bld: 129 mg/dL — ABNORMAL HIGH (ref 70–99)
Potassium: 4.8 mEq/L (ref 3.5–5.1)
Sodium: 137 mEq/L (ref 135–145)

## 2012-07-20 LAB — TROPONIN I: Troponin I: <0.3 ng/mL (ref <0.30)

## 2012-07-20 MED ORDER — HYDRALAZINE HCL 25 MG PO TABS
25.0000 mg | ORAL_TABLET | Freq: Three times a day (TID) | ORAL | Status: DC
  Administered 2012-07-20 – 2012-07-21 (×3): 25 mg via ORAL
  Filled 2012-07-20 (×6): qty 1

## 2012-07-20 MED ORDER — BIOTENE DRY MOUTH MT LIQD
15.0000 mL | Freq: Two times a day (BID) | OROMUCOSAL | Status: DC
  Administered 2012-07-21: 15 mL via OROMUCOSAL

## 2012-07-20 MED ORDER — CHLORHEXIDINE GLUCONATE 0.12 % MT SOLN
15.0000 mL | Freq: Two times a day (BID) | OROMUCOSAL | Status: DC
  Administered 2012-07-20 – 2012-07-22 (×3): 15 mL via OROMUCOSAL
  Filled 2012-07-20 (×6): qty 15

## 2012-07-20 MED ORDER — OLMESARTAN MEDOXOMIL-HCTZ 40-25 MG PO TABS: 1.0000 | ORAL_TABLET | Freq: Every morning | ORAL | Status: DC

## 2012-07-20 MED ORDER — HYDROCHLOROTHIAZIDE 25 MG PO TABS
25.0000 mg | ORAL_TABLET | Freq: Every day | ORAL | Status: DC
  Administered 2012-07-21: 25 mg via ORAL
  Filled 2012-07-20: qty 1

## 2012-07-20 MED ORDER — IRBESARTAN 300 MG PO TABS
300.0000 mg | ORAL_TABLET | Freq: Every day | ORAL | Status: DC
  Administered 2012-07-21: 300 mg via ORAL
  Filled 2012-07-20: qty 1

## 2012-07-20 MED ORDER — ALUM & MAG HYDROXIDE-SIMETH 200-200-20 MG/5ML PO SUSP: 15.0000 mL | Freq: Four times a day (QID) | ORAL | Status: DC | PRN

## 2012-07-20 MED ORDER — HYDRALAZINE HCL 20 MG/ML IJ SOLN: 10.0000 mg | Freq: Four times a day (QID) | INTRAMUSCULAR | Status: DC | PRN

## 2012-07-20 NOTE — Consult Note (Signed)
Requesting physician: Dr  Madilyn Hook   Reason for consultation: Management of  multiple medical issues including AKI on CKD, diabetes mellitus, uncontrolled HTN  History of Present Illness: 47 y/o morbidly obeswe female with hx of diabetes mellitus on insulin, CKD stage IV ( baseline creatinine close to 3 recently), HTN, HL, gout and depression who was admitted to general surgery service on 6/27 for laparoscopic sleeve gastrectomy with EGD. She had surgery on 7/1 and  tolerated procedure well but has been having uncontrolled BP since post op. patient also c/o chest pain and mildly tachycardic this am.  hospitalist team consulted to address these issues.  patient on evaluation reports substernal chest discomfort which is associated with belching and abdominal fullness. Denies radiation of  pain , aggravating or relieving factors, or shortness of breath. Denies any headache, dizziness, blurred vision. Denies N/V or diarrhea.  abdominal pain over surgical site better. . Denies any urinary symptoms. She has not had a BM since surgery.  Patient reports following with Dr Florene Glen with [facility] for her CKD and being on benicar and bystolic for her BP which she reports to be very well controlled at home.  An EKG was done for her chest pain symptoms which was normal, initial troponin was negative as well.     Allergies:   Allergies  Allergen Reactions  . Amlodipine     edema  . Clonidine Derivatives Swelling  . Welchol (Colesevelam Hcl) Nausea Only      Past Medical History  Diagnosis Date  . Hypertension   . Diabetes mellitus   . Gout   . Depression   . Gout   . Hyperlipidemia   . Blood transfusion without reported diagnosis   . Morbid obesity   . Chronic kidney disease     STAGE 3 CHRONIC KIDNEY DISEASE SECONDARY TO DIABETIC GLOMERULOSCLEROSIS AND UNCONTROLLED HYPERTENSION - PER OFFICE NOTES DR. Florene Glen -[facility].  Marland Kitchen Headache(784.0)     CHRONIC H/A'S  . Pain      LEFT SHOULDER PAIN - WAS SEEN AT AN URGENT CARE - GIVEN SLING FOR COMFORT AND TOLD ROM AS TOLERATED.    Past Surgical History  Procedure Laterality Date  . Cesarean section  1988, 1993  . Carpal tunnel release  2008    Medications:  Scheduled Meds: . heparin subcutaneous  5,000 Units Subcutaneous Q8H  . hydrALAZINE  25 mg Oral Q8H  . insulin aspart  0-15 Units Subcutaneous Q4H  . nebivolol  10 mg Oral QHS  . pantoprazole (PROTONIX) IV  40 mg Intravenous Q24H  . [START ON 07/21/2012] protein supplement  2 oz Oral QID   Or  . [START ON 07/21/2012] protein supplement  2 oz Oral QID   Or  . [START ON 07/21/2012] protein supplement  2 oz Oral QID   Continuous Infusions: . sodium chloride 125 mL (07/20/12 0408)   PRN Meds:.alum & mag hydroxide-simeth, morphine injection, ondansetron (ZOFRAN) IV, oxyCODONE-acetaminophen  Social History:  reports that she has quit smoking. Her smoking use included Cigarettes. She smoked 0.00 packs per day for 20 years. She has never used smokeless tobacco. She reports that she drinks about 0.6 ounces of alcohol per week. She reports that she does not use illicit drugs.  Family History  Problem Relation Age of Onset  . Hypertension Mother   . Diabetes Father   . Arthritis Other   . Hyperlipidemia Other   . Cancer Neg Hx   . Heart disease Neg Hx   .  Kidney disease Neg Hx   . Stroke Neg Hx     Review of Systems:  Constitutional: Denies fever, chills, diaphoresis, appetite change and fatigue.  HEENT: Denies photophobia, eye pain, redness, hearing loss, ear pain, congestion, sore throat, rhinorrhea, sneezing, mouth sores, trouble swallowing, neck pain, neck stiffness and tinnitus.   Respiratory: Denies SOB, DOE, cough, chest tightness,  and wheezing.   Cardiovascular: chest pain,Denies  Palpitations, has some  leg swelling.  Gastrointestinal: Denies nausea, vomiting, abdominal pain, diarrhea, constipation, blood in stool and abdominal distention.   Genitourinary: Denies dysuria, urgency, frequency, hematuria, flank pain and difficulty urinating.  Endocrine: Denies: hot or cold intolerance, sweats, polyuria, polydipsia. Musculoskeletal: Denies myalgias, back pain, joint swelling, arthralgias and gait problem.  Skin: Denies pallor, rash and wound.  Neurological: Denies dizziness, seizures, syncope, weakness, light-headedness, numbness and headaches.  Hematological: Denies adenopathy. Easy bruising, personal or family bleeding history  Psychiatric/Behavioral: Denies  mood changes, confusion, nervousness, sleep disturbance and agitation   Physical Exam:  Filed Vitals:   07/20/12 0642 07/20/12 1000 07/20/12 1231 07/20/12 1408  BP: 164/108 167/105 155/87 176/89  Pulse: 103 97  93  Temp: 98.5 F (36.9 C) 97.6 F (36.4 C)  98.5 F (36.9 C)  TempSrc: Oral Oral  Oral  Resp: 16 18  20   Height:      Weight:      SpO2: 96% 97%  100%     Intake/Output Summary (Last 24 hours) at 07/20/12 1452 Last data filed at 07/20/12 1409  Gross per 24 hour  Intake 4137.5 ml  Output   3200 ml  Net  937.5 ml    General: middle aged obese female  in no acute distress. HEENT: No pallor, moist mucosa Heart: Regular rate and rhythm, without murmurs, rubs, gallops. Lungs: Clear to auscultation bilaterally. Abdomen: Soft, mild tenderness over surgical site, JP drain in place. nondistended, positive bowel sounds. Extremities: trace edema, positive pedal pulses. Neuro: AAOX3 nonfocal.  Labs on Admission:  CBC:    Component Value Date/Time   WBC 9.8 07/20/2012 0415   HGB 10.5* 07/20/2012 0415   HCT 32.4* 07/20/2012 0415   PLT 342 07/20/2012 0415   MCV 84.4 07/20/2012 0415   NEUTROABS 8.6* 07/20/2012 0415   LYMPHSABS 0.9 07/20/2012 0415   MONOABS 0.3 07/20/2012 0415   EOSABS 0.0 07/20/2012 0415   BASOSABS 0.0 07/20/2012 3976    Basic Metabolic Panel:    Component Value Date/Time   NA 137 07/20/2012 0920   K 4.8 07/20/2012 0920   CL 104 07/20/2012 0920   CO2  19 07/20/2012 0920   BUN 57* 07/20/2012 0920   CREATININE 2.92* 07/20/2012 0920   CREATININE 2.28* 03/21/2012 0952   GLUCOSE 129* 07/20/2012 0920   CALCIUM 9.6 07/20/2012 0920    Radiological Exams on Admission: No results found.  Assessment/Plan Chest pain  appears to be related to  dyspepsia  Continue PPI. Will add maalox prn EKG and initial troponin negative. Continue tele monitoring. Symptoms had resolved on my evalaution. Will not work her up further.  Uncontrolled HTN  patient on benicar at home. Held on admission. Her renal function seems at baseline( been around 3 lately). will resume benicar. i have also added hydralazine 25 mg q 8 hours for better BP control. Resumed bystolic (reports dose being increased recently) .  CKD stage IV Seems to be slowly progressing. At recent baseline for now.. Follows with [facility]. (Dr Florene Glen)   Diabetes mellitus  on high dose lantus at  home ( 60 units q hs) along with victoza at home. Holding these given NPO. fsg stable. Recent A1C of 7.2. Resume home dose of insulin once taking po.  Morbid obesity S/p laparoscopic sleeve gastrectomy with EGD on 7/1. tolerated well. Currently NPO. Pain control per primary team.  DVT prophylaxis: Sq heparin  Thank you for allowing [place] to participate in patient's care. We will continue to follow along with you.    Time Spent on Admission: 70 minutes  [place], [place] 07/20/2012, 2:52 PM [facility]  Pager [phone]. If 7PM-7AM, please contact night-coverage at [email or web address], password [facility]

## 2012-07-20 NOTE — Op Note (Signed)
[name], [name]:  [id number]  MEDICAL RECORD NO.:  [id number]  LOCATION:  [id number]                         FACILITY:  [facility]  PHYSICIAN:  Madilyn Hook, MD       DATE OF BIRTH:  September 12, 1965  DATE OF PROCEDURE:  07/19/2012 DATE OF DISCHARGE:                              OPERATIVE REPORT   PROCEDURE:  Laparoscopic vertical sleeve gastrectomy with intraoperative endoscopy.  PREOPERATIVE DIAGNOSIS:  Obesity.  POSTOPERATIVE DIAGNOSIS:  Obesity.  SURGEON:  Madilyn Hook, MD  ASSISTANT:  Dr. Hassell Done.  ANESTHESIA:  General endotracheal anesthesia with 40 mL, 1% lidocaine with epinephrine, and 0.25% Marcaine in a 50:50 mixture.  FLUIDS:  1400 mL of crystalloid.  ESTIMATED BLOOD LOSS:  Minimal.  DRAINS:  A 19-French Brenda drain placed along the sleeve staple lines.  SPECIMENS:  Greater curvature of the stomach sent to Pathology for permanent sectioning.  COMPLICATIONS:  None apparent.  FINDINGS:  Vertical sleeve gastrectomy created with 36-French bougie, negative for intraoperative leak, 19-French Brenda drain placed along the sleeve staple line.  INDICATION FOR PROCEDURE:  Brenda Davis is a 47 year old female, she had a BMI of 51 and hypertension and diabetes mellitus and chronic kidney disease.  She was referred by her nephrologist for weight loss.  Her creatinine preoperatively was 3.1, and I discussed with him prior to the surgery today.  Her current laboratory studies, and did recommend proceeding with planned procedure.  OPERATIVE DETAILS:  Brenda Davis was seen and evaluated in the preoperative area and risks and benefits of the procedure were again discussed in lay terms.  Informed consent was obtained.  She was given subcu heparin and prophylactic antibiotics were given.  She was taken to the operating room, placed on the table in supine position.  General endotracheal anesthesia was obtained and Foley catheter was placed.  Her abdomen was  prepped and draped in the standard surgical fashion. Procedure time-out was performed with all operative team members to confirm proper patient and procedure, and a 5-mm Optiview trocar was used to access the peritoneum in the left upper quadrant.  The pneumoperitoneum was obtained and the laparoscope was introduced, and there was no evidence of bleeding or bowel injury upon entry.  A 5 mm left rectus port was placed under direct visualization.  A 5 mm right upper quadrant port and a 15 mm right rectus port were all placed under direct visualization.  Nathanson liver retractor was passed through the epigastrium to retract the left lobe of the liver.  She did have a very large and fatty liver with rounded edges, but otherwise normal-appearing anatomy.  The pylorus was identified and I  measured out 5 cm from the pylorus and began dividing the short gastric vessels.  I carried the division up around the greater curvature of the stomach up towards the spleen and separated the stomach from the spleen, and the left diaphragm to the angle of His.  She did not appear to have any hiatal hernia posteriorly.  The posterior gastric lesser sac adhesions were divided with sharp dissection to the lesser curvature.  After the stomach was completely mobilized, again I identified the pylorus and measured out 5 cm  in length started creating mesh sleeve.  The first firing was taken with a 60-mm black Tri-Staple load, taking care to avoid narrowing the stomach near the angularis incisura.  The second black Tri-Staple load was placed at the crotch of the prior staple line and it was not fired at this time.  A 36-French bougie was passed through the mouth and into the stomach along the lesser curvature of the stomach into the antrum and to the pylorus.  Without reconnecting the stapler, the second firing was taken.  Then, transitioned to purple 60-mm Tri-Staple loads and continued the division of the stomach up  along the bougie towards the angle of His taking care to avoid Christmas tree formation of the staple line and spiraling of the staple line as well as not cramping too tightly to the bougie.  With the final firing of the stapler, I made sure that I was staying off the gastric fat pad, that I could visualize some stomach as to not incorporate any esophagus into the stapler.  The stomach was completely transected and the bougie was removed.  The staple line was inspected for hemostasis.  There were few spots along the staple line with minor bleeding, which was controlled with placement of a few hemoclips.  The leak test was performed, as Dr. Hassell Done passed the well lubricated fiberoptic endoscope through the mouth and esophagus into the stomach to the pylorus, allowed the stomach submerged under water, there was no evidence of any air leakage.  The sleeve appeared hemostatic internally.  There was no evidence of any stricturing or kinking of the sleeve.  The scope was easily passed through the tubular stomach to the pylorus.  The air was removed and the scope was completely removed, and Tisseel fibrin glue was placed covering the staple lines.  I then removed the resected stomach through the 15 mm port site, and the specimen was sent to Pathology for permanent sectioning.  I closed the fascia with 2 interrupted 0 Vicryl sutures using an Endoclose device under direct visualization.  The 19-French Keenan Bachelor drain was placed in the abdomen just posterior to the spleen staple line and exited through the left upper quadrant trocar site, and sutured in place with a nylon drain stitch.  The omentum was placed back over the drain in the stomach and the final trocars removed under direct visualization.  Prior to removal of the final trocar, the abdomen was inspected again for hemostasis and there was no evidence of bleeding or bowel injury.  The left lobe of the liver tract was removed and the abdominal  wall was noted to be hemostatic.  All the trocars were removed and the 15-mm port site was irrigated with sterile saline solution.  The wounds were injected with a total of 42 mL of 1% lidocaine with epinephrine and 0.25% Marcaine in a 50:50 mixture.  Skin edges were approximated with 4-0 Monocryl subcuticular suture and the skin was washed and dried and Dermabond was applied.  All sponge, needle, and instrument counts were correct in the case.  The patient tolerated the procedure well without apparent complication.          ______________________________ Madilyn Hook, MD     BL/MEDQ  D:  07/19/2012  T:  07/20/2012  Job:  [id number]

## 2012-07-20 NOTE — Progress Notes (Signed)
1 Day Post-Op  Subjective: Feeling better this am.  Says that she has had some chest pain but no SOB. She says that it feels like a gas bubble.  Nausea last night but better this am  Objective: Vital signs in last 24 hours: Temp:  [97.3 F (36.3 C)-98.6 F (37 C)] 98.5 F (36.9 C) (07/02 0642) Pulse Rate:  [67-103] 103 (07/02 0642) Resp:  [12-16] 16 (07/02 0642) BP: (75-172)/(25-108) 164/108 mmHg (07/02 0642) SpO2:  [94 %-100 %] 96 % (07/02 0642) Weight:  [292 lb 6 oz (132.62 kg)-300 lb 14.9 oz (136.5 kg)] 300 lb 14.9 oz (136.5 kg) (07/01 1734)    Intake/Output from previous day: 07/01 0701 - 07/02 0700 In: 3218.8 [I.V.:3218.8] Out: 2065 [Urine:1850; Drains:165; Blood:50] Intake/Output this shift:    General appearance: alert, cooperative and no distress Resp: nonlabored, off oxygen Cardio: mild tachy, regular GI: soft, mild incisional tenderness, ND, wounds okay, JP ss, no peritoneal signs Extremities: SCD's bilat  Lab Results:   Recent Labs  07/20/12 0415  WBC 9.8  HGB 10.5*  HCT 32.4*  PLT 342   BMET  Recent Labs  07/20/12 0415  NA 137  K 5.5*  CL 104  CO2 21  GLUCOSE 140*  BUN 59*  CREATININE 2.84*  CALCIUM 9.6   PT/INR No results found for this basename: LABPROT, INR,  in the last 72 hours ABG No results found for this basename: PHART, PCO2, PO2, HCO3,  in the last 72 hours  Studies/Results: No results found.  Anti-infectives: Anti-infectives   Start     Dose/Rate Route Frequency Ordered Stop   07/20/12 0600  ertapenem (INVANZ) 1 g in sodium chloride 0.9 % 50 mL IVPB     1 g 100 mL/hr over 30 Minutes Intravenous On call to O.R. 07/19/12 1220 07/19/12 1429      Assessment/Plan: s/p Procedure(s) with comments: LAPAROSCOPIC SLEEVE GASTRECTOMY with EGD (N/A) - laparoscopic sleeve gastrectomy with EGD UPPER GI ENDOSCOPY (N/A) will ask for IM consult to help with electrolytes and renal issues as well as BP and evaluation of chest pain.  I  think that she looks okay from surgery standpoint but will likely go slower with her given her medical issues.  advance diet slowly and continue to mobilize  LOS: 1 day    [place], [place] 07/20/2012

## 2012-07-20 NOTE — Progress Notes (Signed)
Utilization review completed.  

## 2012-07-20 NOTE — Progress Notes (Signed)
She is feeling better.  BP better.  EKG and troponin negative.  Abdomen okay.

## 2012-07-21 LAB — GLUCOSE, CAPILLARY
Glucose-Capillary: 106 mg/dL — ABNORMAL HIGH (ref 70–99)
Glucose-Capillary: 86 mg/dL (ref 70–99)
Glucose-Capillary: 94 mg/dL (ref 70–99)
Glucose-Capillary: 96 mg/dL (ref 70–99)
Glucose-Capillary: 96 mg/dL (ref 70–99)
Glucose-Capillary: 99 mg/dL (ref 70–99)

## 2012-07-21 LAB — BASIC METABOLIC PANEL
BUN: 53 mg/dL — ABNORMAL HIGH (ref 6–23)
CO2: 23 mEq/L (ref 19–32)
Calcium: 9.3 mg/dL (ref 8.4–10.5)
Chloride: 109 mEq/L (ref 96–112)
Creatinine, Ser: 3.15 mg/dL — ABNORMAL HIGH (ref 0.50–1.10)
GFR calc Af Amer: 19 mL/min — ABNORMAL LOW (ref >90)
GFR calc non Af Amer: 16 mL/min — ABNORMAL LOW (ref >90)
Glucose, Bld: 107 mg/dL — ABNORMAL HIGH (ref 70–99)
Potassium: 4.2 mEq/L (ref 3.5–5.1)
Sodium: 141 mEq/L (ref 135–145)

## 2012-07-21 MED ORDER — ONDANSETRON 4 MG PO TBDP: 4.0000 mg | ORAL_TABLET | Freq: Three times a day (TID) | ORAL | Status: DC | PRN

## 2012-07-21 MED ORDER — OXYCODONE-ACETAMINOPHEN 5-325 MG/5ML PO SOLN: 5.0000 mL | ORAL | Status: DC | PRN

## 2012-07-21 MED ORDER — HYDRALAZINE HCL 50 MG PO TABS
50.0000 mg | ORAL_TABLET | Freq: Three times a day (TID) | ORAL | Status: DC
  Administered 2012-07-21 – 2012-07-22 (×3): 50 mg via ORAL
  Filled 2012-07-21 (×6): qty 1

## 2012-07-21 NOTE — Progress Notes (Signed)
Patient alert and oriented, pain is controlled. Patient is tolerating fluids, advanced to protein shake tolerating well.  Reviewed Gastric sleeve discharge instructions with patient and daughter.  Patient is able to articulate understanding, verified with teach back.    GASTRIC BYPASS / [place] Instructions  These instructions are to help you care for yourself when you go home.  Call: If you have any problems.   Call [phone] and ask for the surgeon on call   If you need immediate assistance come to the ER at [facility]. Tell the ER staff that you are a new post-op gastric bypass or gastric sleeve patient   Signs and symptoms to report:   Severe vomiting or nausea o If you cannot handle clear liquids for longer than 1 day, call your surgeon    Abdominal pain which does not get better after taking your pain medication   Fever greater than 100.4 F and chills   Heart rate over 100 beats a minute   Trouble breathing   Chest pain    Redness, swelling, drainage, or foul odor at incision (surgical) sites    If your incisions open or pull apart   Swelling or pain in calf (lower leg)   Diarrhea (Loose bowel movements that happen often), frequent watery, uncontrolled bowel movements   Constipation, (no bowel movements for 3 days) if this happens:  o Take Milk of Magnesia, 2 tablespoons by mouth, 3 times a day for 2 days if needed o Stop taking Milk of Magnesia once you have had a bowel movement o Call your doctor if constipation continues Or o Take Miralax  (instead of Milk of Magnesia) following the label instructions o Stop taking Miralax once you have had a bowel movement o Call your doctor if constipation continues   Anything you think is "abnormal for you"   Normal side effects after surgery:   Unable to sleep at night or unable to concentrate   Irritability   Being tearful (crying) or depressed These are common complaints, possibly related to your anesthesia, stress of  surgery and change in lifestyle, that usually go away a few weeks after surgery.  If these feelings continue, call your medical doctor.  Wound Care: You may have surgical glue, steri-strips, or staples over your incisions after surgery   Surgical glue:  Looks like a clear film over your incisions and will wear off a little at a time   Steri-strips : Adhesive strips of tape over your incisions. You may notice a yellowish color on the skin under the steri-strips. This is used to make the   steri-strips stick better. Do not pull the steri-strips off - let them fall off   Staples: Staples may be removed before you leave the hospital o If you go home with staples, call [facility] at for an appointment with your surgeon's nurse to have staples removed 10 days after surgery, [phone]   Showering: You may shower two (2) days after your surgery unless your surgeon tells you differently o Wash gently around incisions with warm soapy water, rinse well, and gently pat dry  o If you have a drain (tube from your incision), you may need someone to hold this while you shower  o No tub baths until staples are removed and incisions are healed     Medications:   Medications should be liquid or crushed if larger than the size of a dime   Extended release pills (medication that  releases a little bit at a time through the day) should not be crushed   Depending on the size and number of medications you take, you may need to space (take a few throughout the day)/change the time you take your medications so that you do not over-fill your pouch (smaller stomach)   Make sure you follow-up with your primary care physician to make medication changes needed during rapid weight loss and life-style changes   If you have diabetes, follow up with the doctor that orders your diabetes medication(s) within one week after surgery and check your blood sugar regularly.   Do not drive while taking narcotics (pain  medications)   Do not take acetaminophen (Tylenol) and Roxicet or Lortab Elixir at the same time since these pain medications contain acetaminophen  Diet:                    First 2 Weeks  You will see the nutritionist about two (2) weeks after your surgery. The nutritionist will increase the types of foods you can eat if you are handling liquids well:   If you have severe vomiting or nausea and cannot handle clear liquids lasting longer than 1 day, call your surgeon  Protein Shake   Drink at least 2 ounces of shake 5-6 times per day   Each serving of protein shakes (usually 8 - 12 ounces) should have a minimum of:  o 15 grams of protein  o And no more than 5 grams of carbohydrate    Goal for protein each day: o Men = 80 grams per day o Women = 60 grams per day   Protein powder may be added to fluids such as non-fat milk or Lactaid milk or Soy milk (limit to 35 grams added protein powder per serving)  Hydration   Slowly increase the amount of water and other clear liquids as tolerated (See Acceptable Fluids)   Slowly increase the amount of protein shake as tolerated     Sip fluids slowly and throughout the day   May use sugar substitutes in small amounts (no more than 6 - 8 packets per day; i.e. Splenda)  Fluid Goal   The first goal is to drink at least 8 ounces of protein shake/drink per day (or as directed by the nutritionist); some examples of protein shakes are [employer], [employer], EAS Edge HP, and Unjury. See handout from pre-op Bariatric Education Class: o Slowly increase the amount of protein shake you drink as tolerated o You may find it easier to slowly sip shakes throughout the day o It is important to get your proteins in first   Your fluid goal is to drink 64 - 100 ounces of fluid daily o It may take a few weeks to build up to this   32 oz (or more) should be clear liquids  And    32 oz (or more) should be full liquids (see below for examples)   Liquids should  not contain sugar, caffeine, or carbonation  Clear Liquids:   Water or Sugar-free flavored water (i.e. Fruit H2O, Propel)   Decaffeinated coffee or tea (sugar-free)   Crystal Lite, Wyler's Lite, Minute Maid Lite   Sugar-free Jell-O   Bouillon or broth   Sugar-free Popsicle:   *Less than 20 calories each; Limit 1 per day  Full Liquids: Protein Shakes/Drinks + 2 choices per day of other full liquids   Full liquids must be: o No More Than 12 grams  of Carbs per serving  o No More Than 3 grams of Fat per serving   Strained low-fat cream soup   Non-Fat milk   Fat-free Lactaid Milk   Sugar-free yogurt (Dannon Lite & Fit, Greek yogurt)      Vitamins and Minerals   Start 1 day after surgery unless otherwise directed by your surgeon   2 Chewable Multivitamin / Multimineral Supplement with iron (i.e. Centrum for Adults)   Vitamin B-12, 350 - 500 micrograms sub-lingual (place tablet under the tongue) each day   Chewable Calcium Citrate with Vitamin D-3 (Example: 3 Chewable Calcium Plus 600 with Vitamin D-3) o Take 500 mg three (3) times a day for a total of 1500 mg each day o Do not take all 3 doses of calcium at one time as it may cause constipation, and you can only absorb 500 mg  at a time  o Do not mix multivitamins containing iron with calcium supplements; take 2 hours apart o Do not substitute Tums (calcium carbonate) for your calcium   Menstruating women and those at risk for anemia (a blood disease that causes weakness) may need extra iron o Talk with your doctor to see if you need more iron   If you need extra iron: Total daily Iron recommendation (including Vitamins) is 50 to 100 mg Iron/day   Do not stop taking or change any vitamins or minerals until you talk to your nutritionist or surgeon   Your nutritionist and/or surgeon must approve all vitamin and mineral supplements   Activity and Exercise: It is important to continue walking at home.  Limit your physical activity as  instructed by your doctor.  During this time, use these guidelines:   Do not lift anything greater than ten (10) pounds for at least two (2) weeks   Do not go back to work or drive until Engineer, production says you can   You may have sex when you feel comfortable  o It is VERY important for female patients to use a reliable birth control method; fertility often increases after surgery  o Do not get pregnant for at least 18 months   Start exercising as soon as your doctor tells you that you can o Make sure your doctor approves any physical activity   Start with a simple walking program   Walk 5-15 minutes each day, 7 days per week.    Slowly increase until you are walking 30-45 minutes per day Consider joining our [place] program. [phone] or email [email or web address]   Special Instructions Things to remember:   Free counseling is available for you and your family through collaboration between [facility] and [facility]. Please call [phone] and leave a message   Use your CPAP when sleeping if this applies to you   [facility] has a free Bariatric Surgery Support Group that meets monthly, the 3rd Thursday, 6 pm, [place] can see classes online at [email or web address]   It is very important to keep all follow up appointments with your surgeon, nutritionist, primary care physician, and behavioral health practitioner o After the first year, please follow up with your bariatric surgeon and nutritionist at least once a year in order to maintain best weight loss results [facility]: [place]: [phone] Bariatric Nurse Coordinator: [phone]

## 2012-07-21 NOTE — Progress Notes (Signed)
Doing well. Tolerating liquids.  Pain controlled. No nausea.  HR normal, BP better.  She feels uncomfortable going home tonight and would like to stay tonight.  I do not see any evidence of postop complication and she should be okay for discharge in the morning.

## 2012-07-21 NOTE — Progress Notes (Addendum)
[facility] PROGRESS NOTE  Brenda Davis FAO:[id number] DOB: 1965-02-22 DOA: 07/19/2012 PCP: Scarlette Calico, MD   Brief narrative: 47 y/o morbidly obeswe female with hx of diabetes mellitus on insulin, CKD stage IV ( baseline creatinine close to 3 recently), HTN, HL, gout and depression who was admitted to general surgery service on 6/27 for laparoscopic sleeve gastrectomy with EGD. She had surgery on 7/1 and tolerated procedure well but has been having uncontrolled BP since post op. patient also c/o chest pain and mildly tachycardic this am. hospitalist team consulted to address these issues.  Assessment/ plan Chest pain  Resolved. appears to be related to dyspepsia   Uncontrolled HTN  patient on benicar at home. Held on admission. Her renal function seems at baseline( been around 3 lately).  resumed benicar.  also added hydralazine 25 mg q 8 hours for better BP control. Resumed bystolic (reports dose being increased recently) .  Her renal function slightly worseed this am. Discussed with Dr Lilyan Punt. Hopefully the BP should improve post surgery once her weight decreases. i will d/c benicar altogether given her progressive CKD. I will increase hydralazine dose to 50 mg tid which can be titrated as outpatient. Continue bystolic.  CKD stage IV  Seems to be slowly progressing. At recent baseline for now.. Follows with [facility]. (Dr Florene Glen) . Will d/c benicar  Diabetes mellitus  on high dose lantus at home ( 60 units q hs) along with victoza at home. Holding these given NPO. fsg stable. Recent A1C of 7.2.  Her blood glucose should improve further from the surgery. Her recent A1C is much better from before. ( 7.2 in may vs 8.4 in march) - recommend to  d/c victoza on discharge. Reduce lantus dose by half to 30 units q hs on discharge. If fsg well controlled on full diet , we can reduce it further to 20 units on discharge and follow up as outpatient.. Also with progressive CKD and  low  creatinine clearance, she should be on lower dose of insulin to prevent hypoglycemia.  For now continue on SSI only. fsg remains stable.  Morbid obesity  S/p laparoscopic sleeve gastrectomy with EGD on 7/1. tolerated well. Started on sips today. Pain control per primary team.   DVT prophylaxis: Sq heparin    HPI/Subjective: No overnight issues. BP somewhat improved on resuming meds but still elevated.   Objective: Filed Vitals:   07/20/12 2218 07/21/12 0100 07/21/12 0623 07/21/12 1000  BP: 131/83 126/80 128/83 142/91  Pulse: 83 81 78 67  Temp: 98 F (36.7 C) 98 F (36.7 C) 98.1 F (36.7 C) 97.8 F (36.6 C)  TempSrc: Oral Oral Oral Oral  Resp: 18 18 18 18   Height:      Weight:      SpO2: 98% 94% 94% 98%    Intake/Output Summary (Last 24 hours) at 07/21/12 1218 Last data filed at 07/21/12 1000  Gross per 24 hour  Intake   1080 ml  Output   2990 ml  Net  -1910 ml   Filed Weights   07/19/12 1222 07/19/12 1230 07/19/12 1734  Weight: 132.722 kg (292 lb 9.6 oz) 132.62 kg (292 lb 6 oz) 136.5 kg (300 lb 14.9 oz)    Exam: General: middle aged obese female in no acute distress.  HEENT: No pallor, moist mucosa  Heart: Regular rate and rhythm, without murmurs, rubs, gallops.  Lungs: Clear to auscultation bilaterally.  Abdomen: Soft, mild tenderness over surgical site, JP drain in place. nondistended,  positive bowel sounds.  Extremities: trace edema, positive pedal pulses.  Neuro: AAOX3 nonfocal.   Data Reviewed: Basic Metabolic Panel:  Recent Labs Lab 07/15/12 1400 07/20/12 0415 07/20/12 0920 07/21/12 0345  NA 136 137 137 141  K 4.5 5.5* 4.8 4.2  CL 101 104 104 109  CO2 25 21 19 23   GLUCOSE 135* 140* 129* 107*  BUN 60* 59* 57* 53*  CREATININE 3.10* 2.84* 2.92* 3.15*  CALCIUM 9.6 9.6 9.6 9.3   Liver Function Tests:  Recent Labs Lab 07/15/12 1400 07/20/12 0415  AST 15 26  ALT 15 26  ALKPHOS 85 84  BILITOT 0.2* 0.1*  PROT 7.4 7.3  ALBUMIN 3.3* 2.8*    No results found for this basename: LIPASE, AMYLASE,  in the last 168 hours No results found for this basename: AMMONIA,  in the last 168 hours CBC:  Recent Labs Lab 07/15/12 1400 07/20/12 0415  WBC 10.0 9.8  NEUTROABS 6.1 8.6*  HGB 10.7* 10.5*  HCT 33.6* 32.4*  MCV 86.4 84.4  PLT 371 342   Cardiac Enzymes:  Recent Labs Lab 07/20/12 0920  TROPONINI <0.30   BNP (last 3 results)  Recent Labs  12/07/11 1716  PROBNP 10.0   CBG:  Recent Labs Lab 07/20/12 1921 07/20/12 2338 07/21/12 0352 07/21/12 0805 07/21/12 1132  GLUCAP 104* 104* 96 86 96    Recent Results (from the past 240 hour(s))  SURGICAL PCR SCREEN     Status: None   Collection Time    07/15/12  1:40 PM      Result Value Range Status   MRSA, PCR NEGATIVE  NEGATIVE Final   Staphylococcus aureus NEGATIVE  NEGATIVE Final   Comment:            The Xpert SA Assay (FDA     approved for NASAL specimens     in patients over 77 years of age),     is one component of     a comprehensive surveillance     program.  Test performance has     been validated by [employer] for patients greater     than or equal to 76 year old.     It is not intended     to diagnose infection nor to     guide or monitor treatment.     Studies: No results found.  Scheduled Meds: . antiseptic oral rinse  15 mL Mouth Rinse q12n4p  . chlorhexidine  15 mL Mouth Rinse BID  . heparin subcutaneous  5,000 Units Subcutaneous Q8H  . hydrALAZINE  25 mg Oral Q8H  . irbesartan  300 mg Oral Daily   And  . hydrochlorothiazide  25 mg Oral Daily  . insulin aspart  0-15 Units Subcutaneous Q4H  . nebivolol  10 mg Oral QHS  . pantoprazole (PROTONIX) IV  40 mg Intravenous Q24H  . protein supplement  2 oz Oral QID   Or  . protein supplement  2 oz Oral QID   Or  . protein supplement  2 oz Oral QID   Continuous Infusions: . sodium chloride 125 mL/hr at 07/21/12 1213     Time spent: 25 minutes    Brenda Davis  [facility] Pager [phone] If 7PM-7AM, please contact night-coverage at [email or web address], password [facility] 07/21/2012, 12:18 PM  LOS: 2 days

## 2012-07-21 NOTE — Progress Notes (Signed)
2 Days Post-Op  Subjective: She feels well. Tolerating water. No pain.  Objective: Vital signs in last 24 hours: Temp:  [97.6 F (36.4 C)-98.5 F (36.9 C)] 98.1 F (36.7 C) (07/03 0623) Pulse Rate:  [78-97] 78 (07/03 0623) Resp:  [18-20] 18 (07/03 0623) BP: (126-176)/(80-105) 128/83 mmHg (07/03 0623) SpO2:  [94 %-100 %] 94 % (07/03 0623)    Intake/Output from previous day: 07/02 0701 - 07/03 0700 In: 1500 [I.V.:1500] Out: 2900 [Urine:2800; Drains:100] Intake/Output this shift:    General appearance: alert, cooperative and no distress Resp: nonlabored Cardio: normal rate GI: soft, really nontender this morning, ND, wounds without infection, JP ss, no peritoneal signs Extremities: SCD's bilat  Lab Results:   Recent Labs  07/20/12 0415  WBC 9.8  HGB 10.5*  HCT 32.4*  PLT 342   BMET  Recent Labs  07/20/12 0920 07/21/12 0345  NA 137 141  K 4.8 4.2  CL 104 109  CO2 19 23  GLUCOSE 129* 107*  BUN 57* 53*  CREATININE 2.92* 3.15*  CALCIUM 9.6 9.3   PT/INR No results found for this basename: LABPROT, INR,  in the last 72 hours ABG No results found for this basename: PHART, PCO2, PO2, HCO3,  in the last 72 hours  Studies/Results: No results found.  Anti-infectives: Anti-infectives   Start     Dose/Rate Route Frequency Ordered Stop   07/20/12 0600  ertapenem (INVANZ) 1 g in sodium chloride 0.9 % 50 mL IVPB     1 g 100 mL/hr over 30 Minutes Intravenous On call to O.R. 07/19/12 1220 07/19/12 1429      Assessment/Plan: s/p Procedure(s) with comments: LAPAROSCOPIC SLEEVE GASTRECTOMY with EGD (N/A) - laparoscopic sleeve gastrectomy with EGD UPPER GI ENDOSCOPY (N/A) bariatric clears today.  she looks well.  BP much better controlled.  continue to mobilize, she may be okay for discharge to home later today.  LOS: 2 days    [place], [place] 07/21/2012

## 2012-07-22 LAB — BASIC METABOLIC PANEL
BUN: 51 mg/dL — ABNORMAL HIGH (ref 6–23)
CO2: 22 mEq/L (ref 19–32)
Calcium: 9.2 mg/dL (ref 8.4–10.5)
Chloride: 111 mEq/L (ref 96–112)
Creatinine, Ser: 2.98 mg/dL — ABNORMAL HIGH (ref 0.50–1.10)
GFR calc Af Amer: 20 mL/min — ABNORMAL LOW (ref >90)
GFR calc non Af Amer: 18 mL/min — ABNORMAL LOW (ref >90)
Glucose, Bld: 96 mg/dL (ref 70–99)
Potassium: 4.2 mEq/L (ref 3.5–5.1)
Sodium: 141 mEq/L (ref 135–145)

## 2012-07-22 LAB — GLUCOSE, CAPILLARY
Glucose-Capillary: 81 mg/dL (ref 70–99)
Glucose-Capillary: 98 mg/dL (ref 70–99)

## 2012-07-22 MED ORDER — HYDRALAZINE HCL 50 MG PO TABS: 50.0000 mg | ORAL_TABLET | Freq: Three times a day (TID) | ORAL | Status: DC

## 2012-07-22 MED ORDER — INSULIN GLARGINE 100 UNIT/ML SOLOSTAR PEN: 20.0000 [IU] | PEN_INJECTOR | Freq: Every day | SUBCUTANEOUS | Status: DC

## 2012-07-22 MED ORDER — ONDANSETRON 4 MG PO TBDP: 4.0000 mg | ORAL_TABLET | Freq: Three times a day (TID) | ORAL | Status: DC | PRN

## 2012-07-22 MED ORDER — OXYCODONE-ACETAMINOPHEN 5-325 MG/5ML PO SOLN: 5.0000 mL | ORAL | Status: DC | PRN

## 2012-07-22 NOTE — Progress Notes (Signed)
Discharge instructions given to pt, verbalized understanding. Left the unit in stable condition. 

## 2012-07-22 NOTE — Progress Notes (Signed)
[facility] PROGRESS NOTE  Brenda Davis PRF:[id number] DOB: Aug 29, 1965 DOA: 07/19/2012 PCP: Scarlette Calico, MD  Assessment/ recommendations:  Uncontrolled HTN Discontinue benicar Added hydralazine and BP better controlled . D/c on hydralazine 50 mg tid and titrate dose as outpatient. Continue bystolic.  CKD stage IV  renal function baseline with creatinine around 3 Recommend follow up with her nephrologist Dr Florene Glen.  Diabetes mellitus  recent A1C of 7.2. fsg mostly under 100 post op  on lantus 60 units at home. Recommend discharging her on 20 units of lantus . her blood glucose should improve better after surgery . Also given slowly advanced CKD , her insulin dose should be monitored closely to avoid hypoglycemia. . Discontinue victoza.   HPI/Subjective: C/o some pain over surgical site. BP stable overnight  Objective: Filed Vitals:   07/21/12 1816 07/21/12 2100 07/22/12 0100 07/22/12 0540  BP: 123/73 133/74 126/79 137/71  Pulse: 73 75 80 66  Temp: 98.8 F (37.1 C) 98.4 F (36.9 C) 98.3 F (36.8 C) 98 F (36.7 C)  TempSrc: Oral Oral Oral Oral  Resp: 16 18 18 18   Height:      Weight:      SpO2: 97% 99% 99% 100%    Intake/Output Summary (Last 24 hours) at 07/22/12 9357 Last data filed at 07/22/12 0177  Gross per 24 hour  Intake 4598.75 ml  Output   2825 ml  Net 1773.75 ml   Filed Weights   07/19/12 1222 07/19/12 1230 07/19/12 1734  Weight: 132.722 kg (292 lb 9.6 oz) 132.62 kg (292 lb 6 oz) 136.5 kg (300 lb 14.9 oz)      Exam:  General: middle aged obese female in no acute distress.  HEENT: No pallor, moist mucosa  Heart: Regular rate and rhythm, without murmurs, rubs, gallops.  Lungs: Clear to auscultation bilaterally.  Abdomen: Soft, mild tenderness over surgical site, JP drain in place. nondistended, positive bowel sounds.  Extremities: trace edema, positive pedal pulses.  Neuro: AAOX3 nonfocal.     Data Reviewed: Basic Metabolic  Panel:  Recent Labs Lab 07/15/12 1400 07/20/12 0415 07/20/12 0920 07/21/12 0345 07/22/12 0439  NA 136 137 137 141 141  K 4.5 5.5* 4.8 4.2 4.2  CL 101 104 104 109 111  CO2 25 21 19 23 22   GLUCOSE 135* 140* 129* 107* 96  BUN 60* 59* 57* 53* 51*  CREATININE 3.10* 2.84* 2.92* 3.15* 2.98*  CALCIUM 9.6 9.6 9.6 9.3 9.2   Liver Function Tests:  Recent Labs Lab 07/15/12 1400 07/20/12 0415  AST 15 26  ALT 15 26  ALKPHOS 85 84  BILITOT 0.2* 0.1*  PROT 7.4 7.3  ALBUMIN 3.3* 2.8*   No results found for this basename: LIPASE, AMYLASE,  in the last 168 hours No results found for this basename: AMMONIA,  in the last 168 hours CBC:  Recent Labs Lab 07/15/12 1400 07/20/12 0415  WBC 10.0 9.8  NEUTROABS 6.1 8.6*  HGB 10.7* 10.5*  HCT 33.6* 32.4*  MCV 86.4 84.4  PLT 371 342   Cardiac Enzymes:  Recent Labs Lab 07/20/12 0920  TROPONINI <0.30   BNP (last 3 results)  Recent Labs  12/07/11 1716  PROBNP 10.0   CBG:  Recent Labs Lab 07/21/12 1604 07/21/12 2004 07/21/12 2353 07/22/12 0411 07/22/12 0737  GLUCAP 99 94 106* 81 98    Recent Results (from the past 240 hour(s))  SURGICAL PCR SCREEN     Status: None   Collection Time    07/15/12  1:40 PM      Result Value Range Status   MRSA, PCR NEGATIVE  NEGATIVE Final   Staphylococcus aureus NEGATIVE  NEGATIVE Final   Comment:            The Xpert SA Assay (FDA     approved for NASAL specimens     in patients over 69 years of age),     is one component of     a comprehensive surveillance     program.  Test performance has     been validated by [employer] for patients greater     than or equal to 88 year old.     It is not intended     to diagnose infection nor to     guide or monitor treatment.     Studies: No results found.  Scheduled Meds: . antiseptic oral rinse  15 mL Mouth Rinse q12n4p  . chlorhexidine  15 mL Mouth Rinse BID  . heparin subcutaneous  5,000 Units Subcutaneous Q8H  .  hydrALAZINE  50 mg Oral Q8H  . insulin aspart  0-15 Units Subcutaneous Q4H  . nebivolol  10 mg Oral QHS  . pantoprazole (PROTONIX) IV  40 mg Intravenous Q24H  . protein supplement  2 oz Oral QID   Or  . protein supplement  2 oz Oral QID   Or  . protein supplement  2 oz Oral QID   Continuous Infusions: . sodium chloride 125 mL/hr at 07/22/12 0609      Time spent:25 minutes  [facility] will sign off. Please call for any questions.   Louellen Molder  [facility] Pager [phone]. If 7PM-7AM, please contact night-coverage at [email or web address], password [facility] 07/22/2012, 8:22 AM  LOS: 3 days

## 2012-07-22 NOTE — Progress Notes (Signed)
3 Days Post-Op  Subjective: Feels good.  Ready to go home.   Objective: Vital signs in last 24 hours: Temp:  [97.8 F (36.6 C)-98.8 F (37.1 C)] 98 F (36.7 C) (07/04 0540) Pulse Rate:  [66-80] 66 (07/04 0540) Resp:  [16-18] 18 (07/04 0540) BP: (123-142)/(63-91) 137/71 mmHg (07/04 0540) SpO2:  [97 %-100 %] 100 % (07/04 0540) Last BM Date: 07/17/12  Intake/Output from previous day: 07/03 0701 - 07/04 0700 In: 4598.8 [P.O.:80; I.V.:4518.8] Out: 2775 [Urine:2600; Drains:175] Intake/Output this shift: Total I/O In: -  Out: 50 [Drains:50]  PE: General- In NAD Abdomen-soft, incisions clean, serous drain output  Lab Results:   Recent Labs  07/20/12 0415  WBC 9.8  HGB 10.5*  HCT 32.4*  PLT 342   BMET  Recent Labs  07/21/12 0345 07/22/12 0439  NA 141 141  K 4.2 4.2  CL 109 111  CO2 23 22  GLUCOSE 107* 96  BUN 53* 51*  CREATININE 3.15* 2.98*  CALCIUM 9.3 9.2   PT/INR No results found for this basename: LABPROT, INR,  in the last 72 hours Comprehensive Metabolic Panel:    Component Value Date/Time   NA 141 07/22/2012 0439   K 4.2 07/22/2012 0439   CL 111 07/22/2012 0439   CO2 22 07/22/2012 0439   BUN 51* 07/22/2012 0439   CREATININE 2.98* 07/22/2012 0439   CREATININE 2.28* 03/21/2012 0952   GLUCOSE 96 07/22/2012 0439   CALCIUM 9.2 07/22/2012 0439   AST 26 07/20/2012 0415   ALT 26 07/20/2012 0415   ALKPHOS 84 07/20/2012 0415   BILITOT 0.1* 07/20/2012 0415   PROT 7.3 07/20/2012 0415   ALBUMIN 2.8* 07/20/2012 0415     Studies/Results: No results found.  Anti-infectives: Anti-infectives   Start     Dose/Rate Route Frequency Ordered Stop   07/20/12 0600  ertapenem (INVANZ) 1 g in sodium chloride 0.9 % 50 mL IVPB     1 g 100 mL/hr over 30 Minutes Intravenous On call to O.R. 07/19/12 1220 07/19/12 1429      Assessment Principal Problem:   Morbid obesity with BMI of 50.0-59.9, adult - s/p sleeve gastrectomy; doing well Active Problems:   Type II or unspecified type  diabetes mellitus with renal manifestations, uncontrolled(250.42)   HYPERTENSION, BENIGN ESSENTIAL   Chronic renal insufficiency, stage III (moderate)    LOS: 3 days   Plan: Discharge.   Lucy Boardman J 07/22/2012

## 2012-07-22 NOTE — Discharge Summary (Signed)
Physician Discharge Summary  Patient ID: Brenda Davis MRN: [id number] DOB/AGE: 1965/04/13 47 y.o.  Admit date: 07/19/2012 Discharge date: 07/22/2012  Admission Diagnoses: Principal Problem:   Morbid obesity with BMI of 50.0-59.9, adult - s/p sleeve gastrectomy Active Problems:   Type II or unspecified type diabetes mellitus with renal manifestations, uncontrolled(250.42)   HYPERTENSION, BENIGN ESSENTIAL   Chronic renal insufficiency, stage III (moderate)  Discharge Diagnoses:  Principal Problem:   Morbid obesity with BMI of 50.0-59.9, adult - s/p sleeve gastrectomy Active Problems:   Type II or unspecified type diabetes mellitus with renal manifestations, uncontrolled(250.42)   HYPERTENSION, BENIGN ESSENTIAL   Chronic renal insufficiency, stage III (moderate)   Discharged Condition: good  Hospital Course: Patient with morbid obesity and comorbidities.  Underwent extensive bariatric consultation.  Underwent sleeve gastrectomy by Dr. Lilyan Punt laparoscopically on day of admission.  Advanced per bariatric protocol on restricted liquid diet.  Required medical consultation to manage her hypertension.  The patient mobilized and advanced to a solid diet gradually.  Pain was well-controlled and transitioned off IV medications.    By the time of discharge, the patient was walking well the hallways, eating food well, having flatus.  Pain was-controlled on an oral regimen.  Based on meeting DC criteria and recovering well, I felt it was safe for the patient to be discharged home with close followup.  Instructions were discussed in detail.  They are written as well.     Consults: Internal medicine  Significant Diagnostic Studies:  Results for orders placed during the hospital encounter of 07/19/12 (from the past 72 hour(s))  GLUCOSE, CAPILLARY     Status: None   Collection Time    07/19/12 12:57 PM      Result Value Range   Glucose-Capillary 92  70 - 99 mg/dL   Comment 1 Documented in Chart     GLUCOSE, CAPILLARY     Status: Abnormal   Collection Time    07/19/12  4:30 PM      Result Value Range   Glucose-Capillary 149 (*) 70 - 99 mg/dL   Comment 1 Documented in Chart     Comment 2 Notify RN    GLUCOSE, CAPILLARY     Status: Abnormal   Collection Time    07/19/12  7:55 PM      Result Value Range   Glucose-Capillary 200 (*) 70 - 99 mg/dL  GLUCOSE, CAPILLARY     Status: Abnormal   Collection Time    07/20/12 12:04 AM      Result Value Range   Glucose-Capillary 154 (*) 70 - 99 mg/dL   Comment 1 Documented in Chart    GLUCOSE, CAPILLARY     Status: Abnormal   Collection Time    07/20/12  4:07 AM      Result Value Range   Glucose-Capillary 129 (*) 70 - 99 mg/dL  CBC WITH DIFFERENTIAL     Status: Abnormal   Collection Time    07/20/12  4:15 AM      Result Value Range   WBC 9.8  4.0 - 10.5 K/uL   RBC 3.84 (*) 3.87 - 5.11 MIL/uL   Hemoglobin 10.5 (*) 12.0 - 15.0 g/dL   HCT 32.4 (*) 36.0 - 46.0 %   MCV 84.4  78.0 - 100.0 fL   MCH 27.3  26.0 - 34.0 pg   MCHC 32.4  30.0 - 36.0 g/dL   RDW 13.1  11.5 - 15.5 %   Platelets 342  150 - 400  K/uL   Neutrophils Relative % 88 (*) 43 - 77 %   Neutro Abs 8.6 (*) 1.7 - 7.7 K/uL   Lymphocytes Relative 9 (*) 12 - 46 %   Lymphs Abs 0.9  0.7 - 4.0 K/uL   Monocytes Relative 3  3 - 12 %   Monocytes Absolute 0.3  0.1 - 1.0 K/uL   Eosinophils Relative 0  0 - 5 %   Eosinophils Absolute 0.0  0.0 - 0.7 K/uL   Basophils Relative 0  0 - 1 %   Basophils Absolute 0.0  0.0 - 0.1 K/uL  COMPREHENSIVE METABOLIC PANEL     Status: Abnormal   Collection Time    07/20/12  4:15 AM      Result Value Range   Sodium 137  135 - 145 mEq/L   Potassium 5.5 (*) 3.5 - 5.1 mEq/L   Chloride 104  96 - 112 mEq/L   CO2 21  19 - 32 mEq/L   Glucose, Bld 140 (*) 70 - 99 mg/dL   BUN 59 (*) 6 - 23 mg/dL   Creatinine, Ser 2.84 (*) 0.50 - 1.10 mg/dL   Calcium 9.6  8.4 - 10.5 mg/dL   Total Protein 7.3  6.0 - 8.3 g/dL   Albumin 2.8 (*) 3.5 - 5.2 g/dL   AST 26  0  - 37 U/L   ALT 26  0 - 35 U/L   Alkaline Phosphatase 84  39 - 117 U/L   Total Bilirubin 0.1 (*) 0.3 - 1.2 mg/dL   GFR calc non Af Amer 19 (*) >90 mL/min   GFR calc Af Amer 22 (*) >90 mL/min   Comment:            The eGFR has been calculated     using the CKD EPI equation.     This calculation has not been     validated in all clinical     situations.     eGFR's persistently     <90 mL/min signify     possible Chronic Kidney Disease.  GLUCOSE, CAPILLARY     Status: Abnormal   Collection Time    07/20/12  7:18 AM      Result Value Range   Glucose-Capillary 107 (*) 70 - 99 mg/dL   Comment 1 Notify RN     Comment 2 Documented in Chart    TROPONIN I     Status: None   Collection Time    07/20/12  9:20 AM      Result Value Range   Troponin I <0.30  <0.30 ng/mL   Comment:            Due to the release kinetics of cTnI,     a negative result within the first hours     of the onset of symptoms does not rule out     myocardial infarction with certainty.     If myocardial infarction is still suspected,     repeat the test at appropriate intervals.  BASIC METABOLIC PANEL     Status: Abnormal   Collection Time    07/20/12  9:20 AM      Result Value Range   Sodium 137  135 - 145 mEq/L   Potassium 4.8  3.5 - 5.1 mEq/L   Chloride 104  96 - 112 mEq/L   CO2 19  19 - 32 mEq/L   Glucose, Bld 129 (*) 70 - 99 mg/dL   BUN 57 (*) 6 -  23 mg/dL   Creatinine, Ser 2.92 (*) 0.50 - 1.10 mg/dL   Calcium 9.6  8.4 - 10.5 mg/dL   GFR calc non Af Amer 18 (*) >90 mL/min   GFR calc Af Amer 21 (*) >90 mL/min   Comment:            The eGFR has been calculated     using the CKD EPI equation.     This calculation has not been     validated in all clinical     situations.     eGFR's persistently     <90 mL/min signify     possible Chronic Kidney Disease.  GLUCOSE, CAPILLARY     Status: Abnormal   Collection Time    07/20/12 11:25 AM      Result Value Range   Glucose-Capillary 110 (*) 70 - 99 mg/dL    Comment 1 Notify RN     Comment 2 Documented in Chart    GLUCOSE, CAPILLARY     Status: Abnormal   Collection Time    07/20/12  4:26 PM      Result Value Range   Glucose-Capillary 114 (*) 70 - 99 mg/dL   Comment 1 Notify RN     Comment 2 Documented in Chart    GLUCOSE, CAPILLARY     Status: Abnormal   Collection Time    07/20/12  7:21 PM      Result Value Range   Glucose-Capillary 104 (*) 70 - 99 mg/dL  GLUCOSE, CAPILLARY     Status: Abnormal   Collection Time    07/20/12 11:38 PM      Result Value Range   Glucose-Capillary 104 (*) 70 - 99 mg/dL  BASIC METABOLIC PANEL     Status: Abnormal   Collection Time    07/21/12  3:45 AM      Result Value Range   Sodium 141  135 - 145 mEq/L   Potassium 4.2  3.5 - 5.1 mEq/L   Chloride 109  96 - 112 mEq/L   CO2 23  19 - 32 mEq/L   Glucose, Bld 107 (*) 70 - 99 mg/dL   BUN 53 (*) 6 - 23 mg/dL   Creatinine, Ser 3.15 (*) 0.50 - 1.10 mg/dL   Calcium 9.3  8.4 - 10.5 mg/dL   GFR calc non Af Amer 16 (*) >90 mL/min   GFR calc Af Amer 19 (*) >90 mL/min   Comment:            The eGFR has been calculated     using the CKD EPI equation.     This calculation has not been     validated in all clinical     situations.     eGFR's persistently     <90 mL/min signify     possible Chronic Kidney Disease.  GLUCOSE, CAPILLARY     Status: None   Collection Time    07/21/12  3:52 AM      Result Value Range   Glucose-Capillary 96  70 - 99 mg/dL  GLUCOSE, CAPILLARY     Status: None   Collection Time    07/21/12  8:05 AM      Result Value Range   Glucose-Capillary 86  70 - 99 mg/dL  GLUCOSE, CAPILLARY     Status: None   Collection Time    07/21/12 11:32 AM      Result Value Range   Glucose-Capillary 96  70 - 99 mg/dL  GLUCOSE, CAPILLARY     Status: None   Collection Time    07/21/12  4:04 PM      Result Value Range   Glucose-Capillary 99  70 - 99 mg/dL  GLUCOSE, CAPILLARY     Status: None   Collection Time    07/21/12  8:04 PM      Result  Value Range   Glucose-Capillary 94  70 - 99 mg/dL  GLUCOSE, CAPILLARY     Status: Abnormal   Collection Time    07/21/12 11:53 PM      Result Value Range   Glucose-Capillary 106 (*) 70 - 99 mg/dL  GLUCOSE, CAPILLARY     Status: None   Collection Time    07/22/12  4:11 AM      Result Value Range   Glucose-Capillary 81  70 - 99 mg/dL  BASIC METABOLIC PANEL     Status: Abnormal   Collection Time    07/22/12  4:39 AM      Result Value Range   Sodium 141  135 - 145 mEq/L   Potassium 4.2  3.5 - 5.1 mEq/L   Chloride 111  96 - 112 mEq/L   CO2 22  19 - 32 mEq/L   Glucose, Bld 96  70 - 99 mg/dL   BUN 51 (*) 6 - 23 mg/dL   Creatinine, Ser 2.98 (*) 0.50 - 1.10 mg/dL   Calcium 9.2  8.4 - 10.5 mg/dL   GFR calc non Af Amer 18 (*) >90 mL/min   GFR calc Af Amer 20 (*) >90 mL/min   Comment:            The eGFR has been calculated     using the CKD EPI equation.     This calculation has not been     validated in all clinical     situations.     eGFR's persistently     <90 mL/min signify     possible Chronic Kidney Disease.  GLUCOSE, CAPILLARY     Status: None   Collection Time    07/22/12  7:37 AM      Result Value Range   Glucose-Capillary 98  70 - 99 mg/dL     Treatments:  PROCEDURE: Laparoscopic vertical sleeve gastrectomy with intraoperative  endoscopy   Discharge Exam: Blood pressure 137/71, pulse 66, temperature 98 F (36.7 C), temperature source Oral, resp. rate 18, height 5' 4"  (1.626 m), weight 300 lb 14.9 oz (136.5 kg), SpO2 100.00%.  General: Pt awake/alert/oriented x4 in no major acute distress Eyes: PERRL, normal EOM. Sclera nonicteric Neuro: CN II-XII intact w/o focal sensory/motor deficits. Lymph: No head/neck/groin lymphadenopathy Psych:  No delerium/psychosis/paranoia HENT: Normocephalic, Mucus membranes moist.  No thrush Neck: Supple, No tracheal deviation Chest: No pain.  Good respiratory excursion. CV:  Pulses intact.  Regular rhythm MS: Normal AROM mjr  joints.  No obvious deformity Abdomen: Obese but Soft, Nondistended.  Min tender at incisions.  No incarcerated hernias. Ext:  SCDs BLE.  No significant edema.  No cyanosis Skin: No petechiae / purpura   Disposition: 01-Home or Self Care  Discharge Orders   Future Appointments Provider Department Dept Phone   08/02/2012 4:00 PM [facility] [phone]   08/11/2012 2:30 PM Madilyn Hook, [facility] [place] [phone]   Future Orders Complete By Expires     Call MD for:  difficulty breathing, headache or visual disturbances  As directed  Call MD for:  extreme fatigue  As directed     Call MD for:  hives  As directed     Call MD for:  persistant dizziness or light-headedness  As directed     Call MD for:  persistant nausea and vomiting  As directed     Call MD for:  persistant nausea and vomiting  As directed     Call MD for:  redness, tenderness, or signs of infection (pain, swelling, redness, odor or green/yellow discharge around incision site)  As directed     Call MD for:  redness, tenderness, or signs of infection (pain, swelling, redness, odor or green/yellow discharge around incision site)  As directed     Call MD for:  severe uncontrolled pain  As directed     Call MD for:  severe uncontrolled pain  As directed     Call MD for:  temperature >100.4  As directed     Call MD for:  As directed     Comments:      Temperature > 101.15F    Discharge instructions  As directed     Comments:      Call [phone] to schedule follow up appointment with Dr. Lilyan Punt in about 3 weeks. Call your primary doctor to schedule follow up appointment to manage diabetes and blood pressure. May shower after drain out. May start protein supplements and vitamins. DECREASE YOUR LANTUS TO 30 UNITS DAILY AND STOP THE VICTOZA. Stay hydrated. Start with full liquid diet x1 week, then pureed diet x1 week, then soft diet x1 week, then  increase to low fat, low carb, high protein diet  May start ambulating as soon as discharged and slowly increase activity as tolerated. Monitor your blood pressure and blood sugars closely to avoid getting too low or too high.    Discharge wound care:  As directed     Comments:      If you have closed incisions, shower and bathe over these incisions with soap and water every day.  Remove all surgical dressings on postoperative day #3.  You do not need to replace dressings over the closed incisions unless you feel more comfortable with a Band-Aid covering it.   If you have an open wound that requires packing, please see wound care instructions.  In general, remove all dressings, wash wound with soap and water and then replace with saline moistened gauze.  Do the dressing change at least every day.  Please call our office [phone] if you have further questions.    Driving Restrictions  As directed     Comments:      No driving until off narcotics and can safely swerve away without pain during an emergency    Increase activity slowly  As directed     Increase activity slowly  As directed     Comments:      Walk an hour a day.  Use 20-30 minute walks.  When you can walk 30 minutes without difficulty, increase to low impact/moderate activities such as biking, jogging, swimming, sexual activity..  Eventually can increase to unrestricted activity when not feeling pain.  If you feel pain: STOP!Marland Kitchen   Let pain protect you from overdoing it.  Use ice/heat/over-the-counter pain medications to help minimize his soreness.  Use pain prescriptions as needed to remain active.  It is better to take extra pain medications and be more active than to stay bedridden to avoid all pain medications.    Lifting restrictions  As directed     Comments:      Avoid heavy lifting initially.  Do not push through pain.  You have no specific weight limit.  Coughing and sneezing or four more stressful to your incision than any  lifting you will do. Pain will protect you from injury.  Therefore, avoid intense activity until off all narcotic pain medications.  Coughing and sneezing or four more stressful to your incision than any lifting he will do.    May shower / Bathe  As directed     May walk up steps  As directed     Sexual Activity Restrictions  As directed     Comments:      Sexual activity as tolerated.  Do not push through pain.  Pain will protect you from injury.    Walk with assistance  As directed     Comments:      Walk over an hour a day.  May use a walker/cane/companion to help with balance and stamina.        Medication List    STOP taking these medications       acetaminophen 500 MG tablet  Commonly known as:  TYLENOL     HYDROcodone-acetaminophen 5-325 MG per tablet  Commonly known as:  NORCO/VICODIN     Liraglutide 18 MG/3ML Soln injection  Commonly known as:  VICTOZA     olmesartan-hydrochlorothiazide 40-25 MG per tablet  Commonly known as:  BENICAR HCT     oxyCODONE-acetaminophen 5-325 MG per tablet  Commonly known as:  PERCOCET/ROXICET  Replaced by:  oxyCODONE-acetaminophen 5-325 MG/5ML solution      TAKE these medications       HAIR/SKIN/NAILS Tabs  Take 1 tablet by mouth daily.     hydrALAZINE 50 MG tablet  Commonly known as:  APRESOLINE  Take 1 tablet (50 mg total) by mouth every 8 (eight) hours.     Insulin Glargine 100 UNIT/ML Sopn  Commonly known as:  LANTUS SOLOSTAR  Inject 20 Units into the skin at bedtime.     magnesium hydroxide 400 MG/5ML suspension  Commonly known as:  MILK OF MAGNESIA  Take 30 mLs by mouth daily as needed for constipation.     nebivolol 10 MG tablet  Commonly known as:  BYSTOLIC  Take 10 mg by mouth every evening.     ondansetron 4 MG disintegrating tablet  Commonly known as:  ZOFRAN ODT  Take 1 tablet (4 mg total) by mouth every 8 (eight) hours as needed for nausea.     oxyCODONE-acetaminophen 5-325 MG/5ML solution  Commonly known  as:  ROXICET  Take 5-10 mLs by mouth every 4 (four) hours as needed for pain.     rosuvastatin 20 MG tablet  Commonly known as:  CRESTOR  Take 20 mg by mouth every morning.           Follow-up Information   Follow up with LAYTON, Powell, DO. Schedule an appointment as soon as possible for a visit in 2 weeks.   Contact information:   [address] [place] [address] [phone]       Signed: Adin Hector. 07/22/2012, 9:33 AM

## 2012-07-28 ENCOUNTER — Other Ambulatory Visit: Payer: Self-pay

## 2012-08-01 ENCOUNTER — Encounter (INDEPENDENT_AMBULATORY_CARE_PROVIDER_SITE_OTHER): Payer: Self-pay

## 2012-08-01 ENCOUNTER — Telehealth (INDEPENDENT_AMBULATORY_CARE_PROVIDER_SITE_OTHER): Payer: Self-pay

## 2012-08-01 NOTE — Telephone Encounter (Signed)
Message copied by Ivor Costa on Mon Aug 01, 2012  5:23 PM ------      Message from: [place], [place]: Mon Aug 01, 2012  4:04 PM       She needs a work note saying she can go back to work on 17th of this month please call her about this. ------

## 2012-08-01 NOTE — Telephone Encounter (Signed)
Called and left message for patient that her RTW note is ready for pick up and will be at the front desk.

## 2012-08-02 ENCOUNTER — Encounter: Payer: BC Managed Care – PPO | Attending: General Surgery | Admitting: *Deleted

## 2012-08-02 ENCOUNTER — Encounter: Payer: Self-pay | Admitting: *Deleted

## 2012-08-02 VITALS — Ht 64.0 in | Wt 283.0 lb

## 2012-08-02 DIAGNOSIS — Z713 Dietary counseling and surveillance: Secondary | ICD-10-CM | POA: Insufficient documentation

## 2012-08-07 NOTE — Patient Instructions (Signed)
Patient to follow Phase 3A-Soft, High Protein Diet and follow-up at [facility] in 6 weeks for 2 months post-op nutrition visit for diet advancement. 

## 2012-08-07 NOTE — Progress Notes (Signed)
Bariatric Class:  Appt start time: 1600    End time: 1700.  2 Week Post-Operative Nutrition Class  Patient was seen on 08/02/12 for Post-Operative Nutrition [facility].   Surgery date: 07/19/12 Surgery type: Sleeve Gastrectomy Start weight at [facility]: 309.0 lbs (03/29/12)  Weight today: 283.0 lbs Weight change: 8.5 lbs Total weight lost: 26.0 lbs  TANITA  BODY COMP RESULTS  06/28/12 08/02/12   BMI (kg/m^2) 51.6 48.6   Fat Mass (lbs) 159.5 153.0   Fat Free Mass (lbs) 141.0 130.0   Total Body Water (lbs) 103.0 95.0   The following the learning objectives were met by the patient during this course:  Identifies Phase 3A (Soft, High Proteins) Dietary Goals and will begin from 2 weeks post-operatively to 2 months post-operatively  Identifies appropriate sources of fluids and proteins   States protein recommendations and appropriate sources post-operatively  Identifies the need for appropriate texture modifications, mastication, and bite sizes when consuming solids  Identifies appropriate multivitamin and calcium sources post-operatively  Describes the need for physical activity post-operatively and will follow MD recommendations  States when to call healthcare provider regarding medication questions or post-operative complications  Handouts given during class include:  Phase 3A: Soft, High Protein Diet Handout   Follow-Up Plan: Patient will follow-up at [facility] in 6 weeks for 2 months post-op nutrition visit for diet advancement per MD.

## 2012-08-11 ENCOUNTER — Ambulatory Visit (INDEPENDENT_AMBULATORY_CARE_PROVIDER_SITE_OTHER): Payer: BC Managed Care – PPO | Admitting: General Surgery

## 2012-08-11 ENCOUNTER — Encounter (INDEPENDENT_AMBULATORY_CARE_PROVIDER_SITE_OTHER): Payer: Self-pay | Admitting: General Surgery

## 2012-08-11 VITALS — BP 140/80 | HR 68 | Temp 96.3°F | Resp 15 | Ht 65.0 in | Wt 281.6 lb

## 2012-08-11 DIAGNOSIS — Z5189 Encounter for other specified aftercare: Secondary | ICD-10-CM

## 2012-08-11 DIAGNOSIS — Z4889 Encounter for other specified surgical aftercare: Secondary | ICD-10-CM

## 2012-08-11 NOTE — Progress Notes (Signed)
Subjective:     Patient ID: Brenda Davis, female   DOB: 23-May-1965, 47 y.o.   MRN: [id number]  HPI This patient follows up in 3 weeks status post vertical sleeve gastrectomy for obesity. She has no complaints and seems to be doing very well. She said that initially her energy level was down but since she has found a protein supplement but she enjoys it has been much better. She is actually taking about 100 g of protein per day and walking frequently. She does not have any hunger.  She has no food intolerance and no nausea and vomiting but she says that she has been afraid to work her way into meats.  She is taking her vitamins and protein supplements. She has lost about 15 pounds over this time period she says that her blood pressure is is about 130/80 and her blood sugars have been in the 80s as well. She says that she is completely off all of her Pitocin and all her Lantus and she is following her sugars and they have been well controlled. No reflux  Review of Systems     Objective:   Physical Exam Her abdomen is soft and nontender on exam her incisions are healing nicely without any sign of infection. No evidence of any neurologic deficits    Assessment:     Status post vertical sleeve gastrectomy-doing well She is doing very well from her procedure and has no evidence of postoperative complications. She has no food intolerance and is already sustained drastic improvement in her diabetes. She says that she is staying hydrated and is taking her protein supplements. I actually recommended that she decrease her protein supplements to only twice per day of she is getting about 400 calories per day of off of her protein supplements. Recommend that she increase her activity as tolerated and continue to stay hydrated. She can gradually increase her diet as tolerated.     Plan:     Gradually increased diet and activity as tolerated. Decreased protein supplements to only twice a day and continue  multivitamins Increase physical activity  I will see her back in about 2 months for repeat exam and nutrition labs

## 2012-08-17 ENCOUNTER — Telehealth (INDEPENDENT_AMBULATORY_CARE_PROVIDER_SITE_OTHER): Payer: Self-pay | Admitting: General Surgery

## 2012-08-17 NOTE — Telephone Encounter (Signed)
Pt called with nausea after meals for the past 24h.  Has vomited once (nonbloody, non-bilious).  Denies fevers, tachycardia or severe abd pain.  Pt will try some pepto-bismol this evening.  I told her that if her pain or nausea got worse, she should definitely be evaluated in the ED.  She is about a month out from a gastric sleeve procedure.

## 2012-08-18 ENCOUNTER — Telehealth (INDEPENDENT_AMBULATORY_CARE_PROVIDER_SITE_OTHER): Payer: Self-pay

## 2012-08-18 NOTE — Telephone Encounter (Signed)
I spoke to Dr Lilyan Punt and he said this could be a bug.  He advised stay on liquids, water, Gatorade and try to stay hydrated.  If she can't get hydrated, go to hospital to get fluids.  If persists, he may order an ugi.  I notified the pt and she is appreciative.

## 2012-08-18 NOTE — Telephone Encounter (Signed)
Pt called after speaking to Dr Marcello Moores last night.  She had sleeve gastrectomy on 07/19/2012.  Since yesterday she has been having pain in her middle upper abdomen under the breasts.  She is nauseated and vomits up only watery mucous.  The pain starts out as an irritation and gets worse.  Once she vomits the pain subsides.  She has no fever.  She has only been able to drink water during this time.  She is moving her bowels.  Yesterday she had 3 loose bowel movements and today just one so far.  She is unsure if this is related to surgery or just a bug.  I will be in touch with Dr Lilyan Punt and seek his advice.

## 2012-08-19 ENCOUNTER — Other Ambulatory Visit (INDEPENDENT_AMBULATORY_CARE_PROVIDER_SITE_OTHER): Payer: Self-pay | Admitting: General Surgery

## 2012-08-19 ENCOUNTER — Inpatient Hospital Stay (HOSPITAL_COMMUNITY)
Admission: AD | Admit: 2012-08-19 | Discharge: 2012-08-22 | DRG: 814 | Disposition: A | Discharge status: Home / Self Care | Payer: BC Managed Care – PPO | Source: Ambulatory Visit | Attending: General Surgery | Admitting: General Surgery

## 2012-08-19 ENCOUNTER — Telehealth (INDEPENDENT_AMBULATORY_CARE_PROVIDER_SITE_OTHER): Payer: Self-pay

## 2012-08-19 ENCOUNTER — Encounter (INDEPENDENT_AMBULATORY_CARE_PROVIDER_SITE_OTHER): Payer: Self-pay | Admitting: General Surgery

## 2012-08-19 ENCOUNTER — Observation Stay (HOSPITAL_COMMUNITY): Payer: BC Managed Care – PPO

## 2012-08-19 ENCOUNTER — Other Ambulatory Visit (INDEPENDENT_AMBULATORY_CARE_PROVIDER_SITE_OTHER): Payer: Self-pay

## 2012-08-19 ENCOUNTER — Encounter (HOSPITAL_COMMUNITY): Payer: Self-pay

## 2012-08-19 ENCOUNTER — Ambulatory Visit (INDEPENDENT_AMBULATORY_CARE_PROVIDER_SITE_OTHER): Payer: BC Managed Care – PPO | Admitting: General Surgery

## 2012-08-19 ENCOUNTER — Telehealth (INDEPENDENT_AMBULATORY_CARE_PROVIDER_SITE_OTHER): Payer: Self-pay | Admitting: General Surgery

## 2012-08-19 VITALS — BP 136/74 | HR 68 | Temp 99.4°F | Resp 24 | Ht 64.0 in | Wt 273.8 lb

## 2012-08-19 DIAGNOSIS — Z9889 Other specified postprocedural states: Secondary | ICD-10-CM

## 2012-08-19 DIAGNOSIS — R112 Nausea with vomiting, unspecified: Principal | ICD-10-CM | POA: Diagnosis present

## 2012-08-19 DIAGNOSIS — R109 Unspecified abdominal pain: Secondary | ICD-10-CM

## 2012-08-19 LAB — URINALYSIS W MICROSCOPIC + REFLEX CULTURE
Bacteria, UA: NONE SEEN
Casts: NONE SEEN
Crystals: NONE SEEN
Glucose, UA: NEGATIVE mg/dL
Hgb urine dipstick: NEGATIVE
Ketones, ur: 15 mg/dL — AB
Leukocytes, UA: NEGATIVE
Nitrite: NEGATIVE
Protein, ur: >300 mg/dL — AB
RBC / HPF: NONE SEEN RBC/hpf (ref <3)
Specific Gravity, Urine: 1.025 (ref 1.005–1.030)
Urobilinogen, UA: 0.2 mg/dL (ref 0.0–1.0)
WBC, UA: NONE SEEN WBC/hpf (ref <3)
pH: 5.5 (ref 5.0–8.0)

## 2012-08-19 LAB — CBC WITH DIFFERENTIAL/PLATELET
HCT: 30.3 % — ABNORMAL LOW (ref 36.0–46.0)
Hemoglobin: 10.6 g/dL — ABNORMAL LOW (ref 12.0–15.0)
Lymphocytes Relative: 21 % (ref 12–46)
Lymphs Abs: 1.6 10*3/uL (ref 0.7–4.0)
MCH: 29.9 pg (ref 26.0–34.0)
MCHC: 34.8 g/dL (ref 30.0–36.0)
MCV: 85.8 fL (ref 78.0–100.0)
Monocytes Absolute: 0.5 10*3/uL (ref 0.1–1.0)
Monocytes Relative: 7 % (ref 3–12)
Neutro Abs: 5.4 10*3/uL (ref 1.7–7.7)
Neutrophils Relative %: 72 % (ref 43–77)
Platelets: 301 10*3/uL (ref 150–400)
RBC: 3.53 MIL/uL — ABNORMAL LOW (ref 3.87–5.11)
RDW: 14.2 % (ref 11.5–15.5)
WBC: 7.6 10*3/uL (ref 4.0–10.5)

## 2012-08-19 LAB — GLUCOSE, CAPILLARY
Glucose-Capillary: 118 mg/dL — ABNORMAL HIGH (ref 70–99)
Glucose-Capillary: 69 mg/dL — ABNORMAL LOW (ref 70–99)
Glucose-Capillary: 73 mg/dL (ref 70–99)
Glucose-Capillary: 74 mg/dL (ref 70–99)
Glucose-Capillary: 80 mg/dL (ref 70–99)

## 2012-08-19 MED ORDER — DEXTROSE 50 % IV SOLN
25.0000 mL | Freq: Once | INTRAVENOUS | Status: AC | PRN
  Administered 2012-08-19: 25 mL via INTRAVENOUS

## 2012-08-19 MED ORDER — SODIUM CHLORIDE 0.9 % IV SOLN
INTRAVENOUS | Status: DC
  Administered 2012-08-19: 15:00:00 via INTRAVENOUS

## 2012-08-19 MED ORDER — THIAMINE HCL 100 MG/ML IJ SOLN
Freq: Once | INTRAVENOUS | Status: AC
  Administered 2012-08-19: 23:00:00 via INTRAVENOUS
  Filled 2012-08-19: qty 1000

## 2012-08-19 MED ORDER — ONDANSETRON HCL 4 MG/2ML IJ SOLN
4.0000 mg | Freq: Four times a day (QID) | INTRAMUSCULAR | Status: DC | PRN
  Administered 2012-08-21 (×2): 4 mg via INTRAVENOUS
  Filled 2012-08-19 (×2): qty 2

## 2012-08-19 MED ORDER — INSULIN ASPART 100 UNIT/ML ~~LOC~~ SOLN: 0.0000 [IU] | SUBCUTANEOUS | Status: DC

## 2012-08-19 MED ORDER — IOHEXOL 300 MG/ML  SOLN: 100.0000 mL | Freq: Once | INTRAMUSCULAR | Status: AC | PRN

## 2012-08-19 MED ORDER — DEXTROSE 50 % IV SOLN
INTRAVENOUS | Status: AC
  Administered 2012-08-19: 50 mL
  Filled 2012-08-19: qty 50

## 2012-08-19 MED ORDER — SODIUM CHLORIDE 0.9 % IV SOLN
INTRAVENOUS | Status: DC
  Administered 2012-08-20: 20 mL/h via INTRAVENOUS
  Administered 2012-08-22: 06:00:00 via INTRAVENOUS

## 2012-08-19 MED ORDER — MORPHINE SULFATE 2 MG/ML IJ SOLN
1.0000 mg | INTRAMUSCULAR | Status: DC | PRN
  Administered 2012-08-19 – 2012-08-21 (×8): 2 mg via INTRAVENOUS
  Administered 2012-08-21 – 2012-08-22 (×2): 4 mg via INTRAVENOUS
  Filled 2012-08-19 (×2): qty 1
  Filled 2012-08-19: qty 2
  Filled 2012-08-19: qty 1
  Filled 2012-08-19 (×2): qty 2
  Filled 2012-08-19 (×4): qty 1

## 2012-08-19 MED ORDER — PANTOPRAZOLE SODIUM 40 MG IV SOLR
40.0000 mg | Freq: Two times a day (BID) | INTRAVENOUS | Status: DC
  Administered 2012-08-19 – 2012-08-22 (×7): 40 mg via INTRAVENOUS
  Filled 2012-08-19 (×8): qty 40

## 2012-08-19 MED ORDER — HEPARIN SODIUM (PORCINE) 5000 UNIT/ML IJ SOLN
5000.0000 [IU] | Freq: Three times a day (TID) | INTRAMUSCULAR | Status: DC
  Administered 2012-08-19 – 2012-08-22 (×10): 5000 [IU] via SUBCUTANEOUS
  Filled 2012-08-19 (×12): qty 1

## 2012-08-19 NOTE — Telephone Encounter (Signed)
I called her and she said that she is having some lower abdominal pain and nausea and vomiting.  She was headed to the ER but I asked her to come to the lab and to see me for evaluation.  It sounds like she will need admission for fluids especially given her past renal issues.

## 2012-08-19 NOTE — Progress Notes (Signed)
Patient ID: Brenda Davis, female   DOB: [birth year], 47 y.o.   MRN: [id number]  Chief Complaint  Patient presents with  . Bariatric Follow Up    HPI Brenda Davis is a 47 y.o. female. This patient called complaining of abdominal pain and nausea. She is one-month status post vertical sleeve gastrectomy and has been doing very well since her procedure. I have seen her recently and she was eating regular foods without any food intolerance. Wednesday she had acute onset of upper abdominal pain and "gurgling" in her stomach.  She took some Roxicet and this helped with her discomfort but she continues to have nausea and dry heaves. She says that she is still able to drink fluids what is throwing up a foamy liquid. This is nonbilious and nonbloody and she denies any fevers. She says that her bowels were originally loose on Wednesday when this began but she has not moved her bowels since then. She denies any blood in the stools or melena. She says that her sugars have been very well controlled since her procedure and she is off all her diabetes medications. HPI  Past Medical History  Diagnosis Date  . Hypertension   . Diabetes mellitus   . Gout   . Depression   . Gout   . Hyperlipidemia   . Blood transfusion without reported diagnosis   . Morbid obesity   . Chronic kidney disease     STAGE 3 CHRONIC KIDNEY DISEASE SECONDARY TO DIABETIC GLOMERULOSCLEROSIS AND UNCONTROLLED HYPERTENSION - PER OFFICE NOTES DR. Flore[facility].  Marland Kitchen Headache(784.0)     CHRONIC H/A'S  . Pain     LEFT SHOULDER PAIN - WAS SEEN AT AN URGENT CARE - GIVEN SLING FOR COMFORT AND TOLD ROM AS TOLERATED.    Past Surgical History  Procedure Laterality Date  . Cesarean section  1988, 1993  . Carpal tunnel release  2008  . Laparoscopic gastric sleeve resection N/A 07/19/2012    Procedure: LAPAROSCOPIC SLEEVE GASTRECTOMY with EGD;  Surgeon: Madilyn Hook, DO;  Location: [facility];  Service: General;  Laterality: N/A;   laparoscopic sleeve gastrectomy with EGD  . Upper gi endoscopy N/A 07/19/2012    Procedure: UPPER GI ENDOSCOPY;  Surgeon: Madilyn Hook, DO;  Location: [facility];  Service: General;  Laterality: N/A;    Family History  Problem Relation Age of Onset  . Hypertension Mother   . Diabetes Father   . Arthritis Other   . Hyperlipidemia Other   . Cancer Neg Hx   . Heart disease Neg Hx   . Kidney disease Neg Hx   . Stroke Neg Hx     Social History History  Substance Use Topics  . Smoking status: Former Smoker -- 20 years    Types: Cigarettes  . Smokeless tobacco: Never Used     Comment: QUIT SMOKING ABOUT 2009  . Alcohol Use: 0.6 oz/week    1 Glasses of wine per week    Allergies  Allergen Reactions  . Amlodipine     edema  . Clonidine Derivatives Swelling  . Welchol (Colesevelam Hcl) Nausea Only    Current Outpatient Prescriptions  Medication Sig Dispense Refill  . hydrALAZINE (APRESOLINE) 50 MG tablet Take 1 tablet (50 mg total) by mouth every 8 (eight) hours.  90 tablet  0  . magnesium hydroxide (MILK OF MAGNESIA) 400 MG/5ML suspension Take 30 mLs by mouth daily as needed for constipation.      . Multiple Vitamins-Minerals (HAIR/SKIN/NAILS) TABS Take  1 tablet by mouth daily.      . nebivolol (BYSTOLIC) 10 MG tablet Take 10 mg by mouth every evening.       . rosuvastatin (CRESTOR) 20 MG tablet Take 20 mg by mouth every morning.       No current facility-administered medications for this visit.    Review of Systems Review of Systems All other review of systems negative or noncontributory except as stated in the HPI  Blood pressure 136/74, pulse 68, temperature 99.4 F (37.4 C), temperature source Oral, resp. rate 24, height 5\' 4"  (1.626 m), weight 273 lb 12.8 oz (124.195 kg).  Physical Exam Physical Exam Physical Exam  Nursing note and vitals reviewed. Constitutional: She is oriented to person, place, and time. She appears well-developed and well-nourished. No distress.   HENT:  Head: Normocephalic and atraumatic.  Mouth/Throat: No oropharyngeal exudate.  Eyes: Conjunctivae and EOM are normal. Pupils are equal, round, and reactive to light. Right eye exhibits no discharge. Left eye exhibits no discharge. No scleral icterus.  Neck: Normal range of motion. Neck supple. No tracheal deviation present.  Cardiovascular: Normal rate, regular rhythm, normal heart sounds and intact distal pulses.   Pulmonary/Chest: Effort normal and breath sounds normal. No stridor. No respiratory distress. She has no wheezes.  Abdominal: Soft. Bowel sounds are normal. She exhibits no distension and no mass. There is really no significant abdominal tenderness. When I press in the epigastrium, she says that it makes her nauseated. There is no rebound and no guarding or peritoneal signs.  Musculoskeletal: Normal range of motion. She exhibits no edema and no tenderness.  Neurological: She is alert and oriented to person, place, and time.  Skin: Skin is warm and dry. No rash noted. She is not diaphoretic. No erythema. No pallor.  Psychiatric: She has a normal mood and affect. Her behavior is normal. Judgment and thought content normal.    Data Reviewed   Assessment    Abdominal pain and nausea and vomiting Is unclear if this is related to her procedure since she has been doing very well sensor surgery. This really doesn't sound as though it is a leak or stricture.  However, given her baseline renal insufficiency and elevated creatinine I am concerned for her hydration status and I have recommended that we admit her to the hospital for IV fluids and hydration and further workup of her symptoms. I will also plan for CT scan with oral contrast. Hopefully this is a viral enteritis and will be self-limiting. I do not see any evidence of peritonitis or need for emergent surgical intervention at this time.    Plan    We will plan for admission to [facility] for further workup and  fluid hydration.        [place], [place] 08/19/2012, 11:56 AM

## 2012-08-19 NOTE — Telephone Encounter (Signed)
Patient called back to state that she is still unable to keep anything down and still having abdominal pain.  Patient states she is going to [facility] as she was advised to do yesterday if she wasn't feeling better.  Patient just wanted to make [place] aware.

## 2012-08-19 NOTE — Telephone Encounter (Signed)
Per Edwena Felty at [facility] radiology pt cannot have iv contrast due to renal disease and abn labs. Per Dr Excell Seltzer new order placed in epic for ct abd and pelvis without contrast.

## 2012-08-19 NOTE — Progress Notes (Signed)
Patient had cbg result of 56 with npo status. Asymptomatic of hypoglycemia. Hypoglycemic protocol initiated with giving of Dextrose 50 IV. Dr. Excell Seltzer return page with telephone orders verified for clear liquids & decrease IV NS to 75cc/hr. with alternating banana bag at same rate. Dr. Excell Seltzer relayed he was aware of abd CT result. Follow-up cbg result 118.

## 2012-08-20 LAB — CBC
HCT: 30.2 % — ABNORMAL LOW (ref 36.0–46.0)
Hemoglobin: 9.4 g/dL — ABNORMAL LOW (ref 12.0–15.0)
MCH: 26.9 pg (ref 26.0–34.0)
MCHC: 31.1 g/dL (ref 30.0–36.0)
MCV: 86.3 fL (ref 78.0–100.0)
Platelets: 307 10*3/uL (ref 150–400)
RBC: 3.5 MIL/uL — ABNORMAL LOW (ref 3.87–5.11)
RDW: 13.8 % (ref 11.5–15.5)
WBC: 6.6 10*3/uL (ref 4.0–10.5)

## 2012-08-20 LAB — COMPREHENSIVE METABOLIC PANEL
ALT: 22 U/L (ref 0–35)
AST: 14 U/L (ref 0–37)
Albumin: 2.8 g/dL — ABNORMAL LOW (ref 3.5–5.2)
Alkaline Phosphatase: 110 U/L (ref 39–117)
BUN: 34 mg/dL — ABNORMAL HIGH (ref 6–23)
CO2: 23 mEq/L (ref 19–32)
Calcium: 8.8 mg/dL (ref 8.4–10.5)
Chloride: 107 mEq/L (ref 96–112)
Creatinine, Ser: 2.27 mg/dL — ABNORMAL HIGH (ref 0.50–1.10)
GFR calc Af Amer: 28 mL/min — ABNORMAL LOW (ref >90)
GFR calc non Af Amer: 25 mL/min — ABNORMAL LOW (ref >90)
Glucose, Bld: 86 mg/dL (ref 70–99)
Potassium: 4.7 mEq/L (ref 3.5–5.1)
Sodium: 140 mEq/L (ref 135–145)
Total Bilirubin: 0.2 mg/dL — ABNORMAL LOW (ref 0.3–1.2)
Total Protein: 6.3 g/dL (ref 6.0–8.3)

## 2012-08-20 LAB — GLUCOSE, CAPILLARY
Glucose-Capillary: 73 mg/dL (ref 70–99)
Glucose-Capillary: 76 mg/dL (ref 70–99)
Glucose-Capillary: 76 mg/dL (ref 70–99)
Glucose-Capillary: 79 mg/dL (ref 70–99)
Glucose-Capillary: 80 mg/dL (ref 70–99)
Glucose-Capillary: 84 mg/dL (ref 70–99)

## 2012-08-20 MED ORDER — HYDRALAZINE HCL 50 MG PO TABS
50.0000 mg | ORAL_TABLET | Freq: Three times a day (TID) | ORAL | Status: DC
  Administered 2012-08-20 – 2012-08-22 (×6): 50 mg via ORAL
  Filled 2012-08-20 (×9): qty 1

## 2012-08-20 MED ORDER — NEBIVOLOL HCL 10 MG PO TABS
10.0000 mg | ORAL_TABLET | Freq: Every evening | ORAL | Status: DC
  Administered 2012-08-20 – 2012-08-21 (×2): 10 mg via ORAL
  Filled 2012-08-20 (×3): qty 1

## 2012-08-20 MED ORDER — POLYETHYLENE GLYCOL 3350 17 G PO PACK
17.0000 g | PACK | Freq: Two times a day (BID) | ORAL | Status: DC
  Administered 2012-08-20 (×2): 17 g via ORAL
  Filled 2012-08-20 (×7): qty 1

## 2012-08-20 NOTE — Progress Notes (Signed)
Patient ID: Brenda Davis, female   DOB: 05/05/1965, 47 y.o.   MRN: [id number]    Subjective: States she feels quite a bit better today. No nausea since admission and tolerating clear liquid diet. She had some pain began last night but walk and had flatus and felt immediately better. She has not had a bowel movement in several days.  Objective: Vital signs in last 24 hours: Temp:  [97.8 F (36.6 C)-99.4 F (37.4 C)] 97.9 F (36.6 C) (08/02 0500) Pulse Rate:  [57-68] 57 (08/02 0500) Resp:  [16-24] 16 (08/02 0500) BP: (136-162)/(66-92) 158/92 mmHg (08/02 0500) SpO2:  [96 %-100 %] 97 % (08/02 0500) Weight:  [273 lb (123.832 kg)-273 lb 12.8 oz (124.195 kg)] 273 lb (123.832 kg) (08/01 1310) Last BM Date: 08/16/12  Intake/Output from previous day: 08/01 0701 - 08/02 0700 In: 993.3 [I.V.:993.3] Out: 1650 [Urine:1650] Intake/Output this shift:    General appearance: alert, cooperative and no distress GI: normal findings: soft, non-tender  Lab Results:   Recent Labs  08/19/12 1018 08/20/12 0426  WBC 7.6 6.6  HGB 10.6* 9.4*  HCT 30.3* 30.2*  PLT 301 307   BMET  Recent Labs  08/20/12 0426  NA 140  K 4.7  CL 107  CO2 23  GLUCOSE 86  BUN 34*  CREATININE 2.27*  CALCIUM 8.8     Studies/Results: Ct Abdomen Pelvis Wo Contrast  08/19/2012   *RADIOLOGY REPORT*  Clinical Data: Status post sleeve gastrectomy, nausea vomiting and abdominal pain  CT ABDOMEN AND PELVIS WITHOUT CONTRAST  Technique:  Multidetector CT imaging of the abdomen and pelvis was performed following the standard protocol without intravenous contrast.  Comparison: None.  Findings: The lung bases are free of acute infiltrate or sizable effusion.  The liver, spleen, adrenal glands and pancreas are all normal in their CT appearance.  The gallbladder is well distended and demonstrates some differential dependent density which may be related to gallbladder sludge.  The kidneys are well visualized demonstrate no  calculi or obstructive changes.  There are changes consistent with the sleeve gastrectomy identified.  No perigastric fluid collection is identified there is no free air seen.  Postsurgical changes are noted in the anterior abdominal wall related to the surgery. The appendix is within normal limits.  The bladder is well distended without opacified urine.  The uterus is within normal limits.  No pelvic mass lesion is seen.  No bony abnormality is noted.  IMPRESSION: Status post sleeve gastrectomy.  No acute abnormality is identified.   Original Report Authenticated By: Inez Catalina, M.D.    Anti-infectives: Anti-infectives   None      Assessment/Plan: Abdominal pain and nausea post sleeve gastrectomy. Etiology uncertain. CT scan of the above is negative. She is symptomatically much better today. Constipation may be an issue. We will try MiraLAX today and advance to bariatric full liquid diet. If tolerated well she could probably go home tomorrow.    LOS: 1 day    [name] T 08/20/2012

## 2012-08-21 LAB — GLUCOSE, CAPILLARY
Glucose-Capillary: 79 mg/dL (ref 70–99)
Glucose-Capillary: 86 mg/dL (ref 70–99)
Glucose-Capillary: 71 mg/dL (ref 70–99)
Glucose-Capillary: 84 mg/dL (ref 70–99)
Glucose-Capillary: 109 mg/dL — ABNORMAL HIGH (ref 70–99)

## 2012-08-21 MED ORDER — MAGNESIUM HYDROXIDE 400 MG/5ML PO SUSP
30.0000 mL | Freq: Every day | ORAL | Status: DC | PRN
  Administered 2012-08-21: 30 mL via ORAL
  Filled 2012-08-21: qty 30

## 2012-08-21 NOTE — Progress Notes (Signed)
Patient ID: Brenda Davis, female   DOB: 1965-07-14, 47 y.o.   MRN: [id number]    Subjective: Having episodes of "gas". She will feel rumbling in her midabdomen and have audible borborygmi. When this develops into midabdominal pain radiating to her epigastrium followed by nausea and vomiting. Generally just dry heaves and not much. She will walk and passed some gas and then feel better for a while. She has not yet had a bowel movement. She is drinking some liquids.  Objective: Vital signs in last 24 hours: Temp:  [97.9 F (36.6 C)-98.2 F (36.8 C)] 98.1 F (36.7 C) (08/03 0630) Pulse Rate:  [64-76] 64 (08/03 0630) Resp:  [18] 18 (08/03 0630) BP: (120-147)/(73-77) 147/74 mmHg (08/03 0630) SpO2:  [95 %-100 %] 97 % (08/03 0630) Last BM Date: 08/16/12  Intake/Output from previous day: 08/02 0701 - 08/03 0700 In: 1315.4 [P.O.:620; I.V.:695.4] Out: 1800 [Urine:1800] Intake/Output this shift:    General appearance: alert, cooperative and no distress GI: normal findings: soft, non-tender and no apparent distention  Lab Results:   Recent Labs  08/19/12 1018 08/20/12 0426  WBC 7.6 6.6  HGB 10.6* 9.4*  HCT 30.3* 30.2*  PLT 301 307   BMET  Recent Labs  08/20/12 0426  NA 140  K 4.7  CL 107  CO2 23  GLUCOSE 86  BUN 34*  CREATININE 2.27*  CALCIUM 8.8     Studies/Results: Ct Abdomen Pelvis Wo Contrast  08/19/2012   *RADIOLOGY REPORT*  Clinical Data: Status post sleeve gastrectomy, nausea vomiting and abdominal pain  CT ABDOMEN AND PELVIS WITHOUT CONTRAST  Technique:  Multidetector CT imaging of the abdomen and pelvis was performed following the standard protocol without intravenous contrast.  Comparison: None.  Findings: The lung bases are free of acute infiltrate or sizable effusion.  The liver, spleen, adrenal glands and pancreas are all normal in their CT appearance.  The gallbladder is well distended and demonstrates some differential dependent density which may be related  to gallbladder sludge.  The kidneys are well visualized demonstrate no calculi or obstructive changes.  There are changes consistent with the sleeve gastrectomy identified.  No perigastric fluid collection is identified there is no free air seen.  Postsurgical changes are noted in the anterior abdominal wall related to the surgery. The appendix is within normal limits.  The bladder is well distended without opacified urine.  The uterus is within normal limits.  No pelvic mass lesion is seen.  No bony abnormality is noted.  IMPRESSION: Status post sleeve gastrectomy.  No acute abnormality is identified.   Original Report Authenticated By: Inez Catalina, M.D.    Anti-infectives: Anti-infectives   None      Assessment/Plan: Abdominal pain, nausea, one-month post sleeve gastrectomy. CT scan unremarkable. A lot of her symptoms seem to be related to her lower GI tract, possible constipation. The results with MiraLAX yet. At milk of magnesia today.    LOS: 2 days    Hermann Dottavio T 08/21/2012

## 2012-08-22 LAB — GLUCOSE, CAPILLARY
Glucose-Capillary: 107 mg/dL — ABNORMAL HIGH (ref 70–99)
Glucose-Capillary: 79 mg/dL (ref 70–99)
Glucose-Capillary: 92 mg/dL (ref 70–99)
Glucose-Capillary: 97 mg/dL (ref 70–99)

## 2012-08-22 LAB — CBC WITH DIFFERENTIAL/PLATELET
Basophils Absolute: 0 10*3/uL (ref 0.0–0.1)
Basophils Relative: 0 % (ref 0–1)
Eosinophils Absolute: 0.4 10*3/uL (ref 0.0–0.7)
Eosinophils Relative: 7 % — ABNORMAL HIGH (ref 0–5)
HCT: 28.1 % — ABNORMAL LOW (ref 36.0–46.0)
Hemoglobin: 9 g/dL — ABNORMAL LOW (ref 12.0–15.0)
Lymphocytes Relative: 28 % (ref 12–46)
Lymphs Abs: 1.5 10*3/uL (ref 0.7–4.0)
MCH: 27.4 pg (ref 26.0–34.0)
MCHC: 32 g/dL (ref 30.0–36.0)
MCV: 85.7 fL (ref 78.0–100.0)
Monocytes Absolute: 0.6 10*3/uL (ref 0.1–1.0)
Monocytes Relative: 12 % (ref 3–12)
Neutro Abs: 2.8 10*3/uL (ref 1.7–7.7)
Neutrophils Relative %: 53 % (ref 43–77)
Platelets: 308 10*3/uL (ref 150–400)
RBC: 3.28 MIL/uL — ABNORMAL LOW (ref 3.87–5.11)
RDW: 13.8 % (ref 11.5–15.5)
WBC: 5.3 10*3/uL (ref 4.0–10.5)

## 2012-08-22 LAB — BASIC METABOLIC PANEL
BUN: 28 mg/dL — ABNORMAL HIGH (ref 6–23)
CO2: 21 mEq/L (ref 19–32)
Calcium: 8.8 mg/dL (ref 8.4–10.5)
Chloride: 105 mEq/L (ref 96–112)
Creatinine, Ser: 2.59 mg/dL — ABNORMAL HIGH (ref 0.50–1.10)
GFR calc Af Amer: 24 mL/min — ABNORMAL LOW (ref >90)
GFR calc non Af Amer: 21 mL/min — ABNORMAL LOW (ref >90)
Glucose, Bld: 84 mg/dL (ref 70–99)
Potassium: 4.6 mEq/L (ref 3.5–5.1)
Sodium: 137 mEq/L (ref 135–145)

## 2012-08-22 MED ORDER — SODIUM CHLORIDE 0.9 % IV SOLN
INTRAVENOUS | Status: DC
  Administered 2012-08-22: 10:00:00 via INTRAVENOUS

## 2012-08-22 MED ORDER — PANTOPRAZOLE SODIUM 40 MG PO TBEC: 40.0000 mg | DELAYED_RELEASE_TABLET | Freq: Every day | ORAL | Status: DC

## 2012-08-22 MED ORDER — ONDANSETRON 4 MG PO TBDP: 4.0000 mg | ORAL_TABLET | Freq: Three times a day (TID) | ORAL | Status: DC | PRN

## 2012-08-22 NOTE — Progress Notes (Signed)
Still doing well.  Taking regular food without any abdominal pain, nausea or vomiting, or "gurggling" as she was having.  She should be okay for discharge to home.

## 2012-08-22 NOTE — Progress Notes (Signed)
  Subjective: Feels 100% better.  Had several dark stools last night.  No nausea  Objective: Vital signs in last 24 hours: Temp:  [98.5 F (36.9 C)-98.8 F (37.1 C)] 98.8 F (37.1 C) (08/04 0520) Pulse Rate:  [71-77] 71 (08/04 0520) Resp:  [16-18] 18 (08/04 0520) BP: (110-151)/(72-89) 110/72 mmHg (08/04 0520) SpO2:  [97 %-100 %] 97 % (08/04 0520) Last BM Date: 08/22/12  Intake/Output from previous day: 08/03 0701 - 08/04 0700 In: 1080 [P.O.:600; I.V.:480] Out: 925 [Urine:525; Stool:400] Intake/Output this shift:    General appearance: alert, cooperative and no distress Resp: clear to auscultation bilaterally Cardio: normal rate, regular GI: soft, NT, ND, no peritoneal signs Extremities: SCD's bilat  Lab Results:   Recent Labs  08/19/12 1018 08/20/12 0426  WBC 7.6 6.6  HGB 10.6* 9.4*  HCT 30.3* 30.2*  PLT 301 307   BMET  Recent Labs  08/20/12 0426  NA 140  K 4.7  CL 107  CO2 23  GLUCOSE 86  BUN 34*  CREATININE 2.27*  CALCIUM 8.8   PT/INR No results found for this basename: LABPROT, INR,  in the last 72 hours ABG No results found for this basename: PHART, PCO2, PO2, HCO3,  in the last 72 hours  Studies/Results: No results found.  Anti-infectives: Anti-infectives   None      Assessment/Plan: s/p * No surgery found * she feels much better after moving bowels.  no pain or nausea.  will try regular diet and if tolerates okay, she may be okay for discharge later today.  she says that they have been dark stools as well so will keep her on PPI in case this was ulcer.  will recheck labs as well.  LOS: 3 days    Madilyn Hook DAVID 08/22/2012

## 2012-08-22 NOTE — Progress Notes (Signed)
Pt is up to br having multiple dark brown diarrhea stools tonight.  Pt states she's had about 8 or 9 today total.  No obvious blood noted in stools.  Dr. Excell Seltzer notified.  No new orders received.

## 2012-08-26 NOTE — Discharge Summary (Signed)
Physician Discharge Summary  Patient ID: Brenda Davis MRN: [id number] DOB/AGE: Jun 22, 1965 47 y.o.  Admit date: 08/19/2012 Discharge date: 08/26/2012  Admission Diagnoses: nausea and vomiting  Discharge Diagnoses: same Active Problems:   * No active hospital problems. *   Discharged Condition: stable  Hospital Course: admitted 08/19/12 for nausea and vomiting and abdominal pain.  She was given laxatives to move her bowels and after this, her abdominal pain and nausea improved.  She was given regular diet and abdominal pain completely resolved.  She was stable for discharge on HD 3   Consults: None  Significant Diagnostic Studies: CT  Treatments: IV hydration  Disposition: 01-Home or Self Care  Discharge Orders   Future Appointments Provider Department Dept Phone   09/13/2012 8:30 AM Bethann Goo [place], RD [facility] [phone]   Future Orders Complete By Expires     Call MD for:  persistant nausea and vomiting  As directed     Call MD for:  severe uncontrolled pain  As directed     Call MD for:  temperature >100.4  As directed     Discharge instructions  As directed     Comments:      Resume high protein, low fat, low carb diet. Resume activity as tolerated. Stool softeners as needed for constipation. Drink plenty of fluids. Follow up with Dr. Lilyan Punt at your regularly scheduled appointment or call [phone] for follow up sooner if needed.    Increase activity slowly  As directed         Medication List         hydrALAZINE 50 MG tablet  Commonly known as:  APRESOLINE  Take 1 tablet (50 mg total) by mouth every 8 (eight) hours.     magnesium hydroxide 400 MG/5ML suspension  Commonly known as:  MILK OF MAGNESIA  Take 30 mLs by mouth daily as needed for constipation.     nebivolol 10 MG tablet  Commonly known as:  BYSTOLIC  Take 10 mg by mouth every evening.     ondansetron 4 MG disintegrating tablet  Commonly known  as:  ZOFRAN ODT  Take 1 tablet (4 mg total) by mouth every 8 (eight) hours as needed for nausea.     pantoprazole 40 MG tablet  Commonly known as:  PROTONIX  Take 1 tablet (40 mg total) by mouth daily.     rosuvastatin 20 MG tablet  Commonly known as:  CRESTOR  Take 20 mg by mouth every morning.         SignedMadilyn Hook DAVID 08/26/2012, 9:30 AM

## 2012-08-31 ENCOUNTER — Telehealth (INDEPENDENT_AMBULATORY_CARE_PROVIDER_SITE_OTHER): Payer: Self-pay | Admitting: *Deleted

## 2012-08-31 ENCOUNTER — Encounter (INDEPENDENT_AMBULATORY_CARE_PROVIDER_SITE_OTHER): Payer: Self-pay | Admitting: *Deleted

## 2012-08-31 ENCOUNTER — Telehealth (INDEPENDENT_AMBULATORY_CARE_PROVIDER_SITE_OTHER): Payer: Self-pay

## 2012-08-31 NOTE — Telephone Encounter (Signed)
Patient returning call from our office.  Patient advised that her RTW note has been approved by Dr. Lilyan Punt and ready for pick up.  Patient req'd this to be faxed to [phone] attn:  Ms. Ronnie Doss.  RTW note faxed and confirmation rec'd.

## 2012-08-31 NOTE — Telephone Encounter (Signed)
RTW note faxed to [phone].  Previous number given was incorrect.  Fax confirmation has been verified and rec'd and attached to outgoing fax

## 2012-08-31 NOTE — Telephone Encounter (Signed)
Dr. Lilyan Punt called back at this time and approved return to work note for 08/26/12.  Left message for patient to call back to let us know how to get letter to her(fax, mail, pick up)  Awaiting call back at this time.

## 2012-08-31 NOTE — Telephone Encounter (Signed)
Patient called to request a return to work note for 08/26/12.  Patient states she was hospitalized on 08/19/12, discharged on 08/22/12 but didn't feel up to going back to work until 08/26/12.  Paged Dr. Lilyan Punt for approval to give return to work note for 08/26/12.  Awaiting response at this time.

## 2012-08-31 NOTE — Telephone Encounter (Signed)
Patient has still not called back at this time.  Left another message for patient to call back and let us know where to send letter too.

## 2012-09-02 ENCOUNTER — Inpatient Hospital Stay (HOSPITAL_COMMUNITY)
Admission: EM | Admit: 2012-09-02 | Discharge: 2012-09-09 | DRG: 182 | Disposition: A | Discharge status: Home / Self Care | Payer: BC Managed Care – PPO | Attending: General Surgery | Admitting: General Surgery

## 2012-09-02 ENCOUNTER — Emergency Department (HOSPITAL_COMMUNITY): Payer: BC Managed Care – PPO

## 2012-09-02 ENCOUNTER — Encounter (HOSPITAL_COMMUNITY): Payer: Self-pay

## 2012-09-02 DIAGNOSIS — R112 Nausea with vomiting, unspecified: Secondary | ICD-10-CM

## 2012-09-02 DIAGNOSIS — R1013 Epigastric pain: Secondary | ICD-10-CM | POA: Diagnosis present

## 2012-09-02 DIAGNOSIS — N183 Chronic kidney disease, stage 3 unspecified: Secondary | ICD-10-CM | POA: Diagnosis present

## 2012-09-02 DIAGNOSIS — Z6841 Body Mass Index (BMI) 40.0 and over, adult: Secondary | ICD-10-CM

## 2012-09-02 DIAGNOSIS — Z87891 Personal history of nicotine dependence: Secondary | ICD-10-CM

## 2012-09-02 DIAGNOSIS — R6883 Chills (without fever): Secondary | ICD-10-CM | POA: Diagnosis present

## 2012-09-02 DIAGNOSIS — F329 Major depressive disorder, single episode, unspecified: Secondary | ICD-10-CM | POA: Diagnosis present

## 2012-09-02 DIAGNOSIS — R1115 Cyclical vomiting syndrome unrelated to migraine: Principal | ICD-10-CM | POA: Diagnosis present

## 2012-09-02 DIAGNOSIS — Z9889 Other specified postprocedural states: Secondary | ICD-10-CM

## 2012-09-02 DIAGNOSIS — N049 Nephrotic syndrome with unspecified morphologic changes: Secondary | ICD-10-CM | POA: Diagnosis present

## 2012-09-02 DIAGNOSIS — F3289 Other specified depressive episodes: Secondary | ICD-10-CM | POA: Diagnosis present

## 2012-09-02 DIAGNOSIS — E1129 Type 2 diabetes mellitus with other diabetic kidney complication: Secondary | ICD-10-CM | POA: Diagnosis present

## 2012-09-02 DIAGNOSIS — E785 Hyperlipidemia, unspecified: Secondary | ICD-10-CM | POA: Diagnosis present

## 2012-09-02 DIAGNOSIS — M109 Gout, unspecified: Secondary | ICD-10-CM | POA: Diagnosis present

## 2012-09-02 DIAGNOSIS — R109 Unspecified abdominal pain: Secondary | ICD-10-CM

## 2012-09-02 DIAGNOSIS — R197 Diarrhea, unspecified: Secondary | ICD-10-CM | POA: Diagnosis present

## 2012-09-02 DIAGNOSIS — I129 Hypertensive chronic kidney disease with stage 1 through stage 4 chronic kidney disease, or unspecified chronic kidney disease: Secondary | ICD-10-CM | POA: Diagnosis present

## 2012-09-02 LAB — COMPREHENSIVE METABOLIC PANEL
ALT: 40 U/L — ABNORMAL HIGH (ref 0–35)
AST: 32 U/L (ref 0–37)
Albumin: 3 g/dL — ABNORMAL LOW (ref 3.5–5.2)
Alkaline Phosphatase: 165 U/L — ABNORMAL HIGH (ref 39–117)
BUN: 37 mg/dL — ABNORMAL HIGH (ref 6–23)
CO2: 20 mEq/L (ref 19–32)
Calcium: 9.7 mg/dL (ref 8.4–10.5)
Chloride: 101 mEq/L (ref 96–112)
Creatinine, Ser: 2.22 mg/dL — ABNORMAL HIGH (ref 0.50–1.10)
GFR calc Af Amer: 29 mL/min — ABNORMAL LOW (ref >90)
GFR calc non Af Amer: 25 mL/min — ABNORMAL LOW (ref >90)
Glucose, Bld: 119 mg/dL — ABNORMAL HIGH (ref 70–99)
Potassium: 5.4 mEq/L — ABNORMAL HIGH (ref 3.5–5.1)
Sodium: 135 mEq/L (ref 135–145)
Total Bilirubin: 0.4 mg/dL (ref 0.3–1.2)
Total Protein: 7.6 g/dL (ref 6.0–8.3)

## 2012-09-02 LAB — URINALYSIS, ROUTINE W REFLEX MICROSCOPIC
Glucose, UA: 100 mg/dL — AB
Ketones, ur: 15 mg/dL — AB
Leukocytes, UA: NEGATIVE
Nitrite: NEGATIVE
Protein, ur: >300 mg/dL — AB
Specific Gravity, Urine: 1.019 (ref 1.005–1.030)
Urobilinogen, UA: 1 mg/dL (ref 0.0–1.0)
pH: 5.5 (ref 5.0–8.0)

## 2012-09-02 LAB — CBC
HCT: 32.7 % — ABNORMAL LOW (ref 36.0–46.0)
Hemoglobin: 10.6 g/dL — ABNORMAL LOW (ref 12.0–15.0)
MCH: 26.9 pg (ref 26.0–34.0)
MCHC: 32.4 g/dL (ref 30.0–36.0)
MCV: 83 fL (ref 78.0–100.0)
Platelets: 361 10*3/uL (ref 150–400)
RBC: 3.94 MIL/uL (ref 3.87–5.11)
RDW: 14.1 % (ref 11.5–15.5)
WBC: 6 10*3/uL (ref 4.0–10.5)

## 2012-09-02 LAB — LACTIC ACID, PLASMA: Lactic Acid, Venous: 1.7 mmol/L (ref 0.5–2.2)

## 2012-09-02 LAB — URINE MICROSCOPIC-ADD ON

## 2012-09-02 LAB — GLUCOSE, CAPILLARY
Glucose-Capillary: 83 mg/dL (ref 70–99)
Glucose-Capillary: 78 mg/dL (ref 70–99)

## 2012-09-02 LAB — LIPASE, BLOOD: Lipase: 59 U/L (ref 11–59)

## 2012-09-02 MED ORDER — THIAMINE HCL 100 MG/ML IJ SOLN
100.0000 mg | Freq: Every day | INTRAMUSCULAR | Status: DC
  Administered 2012-09-03 – 2012-09-09 (×7): 100 mg via INTRAVENOUS
  Filled 2012-09-02 (×7): qty 1

## 2012-09-02 MED ORDER — MORPHINE SULFATE 2 MG/ML IJ SOLN
1.0000 mg | INTRAMUSCULAR | Status: DC | PRN
  Administered 2012-09-02: 1 mg via INTRAVENOUS
  Administered 2012-09-02: 2 mg via INTRAVENOUS
  Administered 2012-09-03: 4 mg via INTRAVENOUS
  Administered 2012-09-03: 2 mg via INTRAVENOUS
  Administered 2012-09-03: 4 mg via INTRAVENOUS
  Administered 2012-09-03 – 2012-09-06 (×11): 2 mg via INTRAVENOUS
  Administered 2012-09-06 – 2012-09-08 (×7): 4 mg via INTRAVENOUS
  Administered 2012-09-08 – 2012-09-09 (×4): 2 mg via INTRAVENOUS
  Filled 2012-09-02: qty 1
  Filled 2012-09-02: qty 2
  Filled 2012-09-02 (×3): qty 1
  Filled 2012-09-02: qty 2
  Filled 2012-09-02: qty 1
  Filled 2012-09-02: qty 2
  Filled 2012-09-02 (×2): qty 1
  Filled 2012-09-02: qty 2
  Filled 2012-09-02: qty 1
  Filled 2012-09-02 (×3): qty 2
  Filled 2012-09-02: qty 1
  Filled 2012-09-02: qty 2
  Filled 2012-09-02 (×6): qty 1
  Filled 2012-09-02 (×2): qty 2
  Filled 2012-09-02 (×2): qty 1

## 2012-09-02 MED ORDER — INSULIN ASPART 100 UNIT/ML ~~LOC~~ SOLN
0.0000 [IU] | SUBCUTANEOUS | Status: DC
Start: 2012-09-02 — End: 2012-09-09
  Administered 2012-09-04: 3 [IU] via SUBCUTANEOUS
  Administered 2012-09-05: 2 [IU] via SUBCUTANEOUS

## 2012-09-02 MED ORDER — SODIUM CHLORIDE 0.9 % IV SOLN
INTRAVENOUS | Status: DC
  Administered 2012-09-02 – 2012-09-03 (×4): via INTRAVENOUS
  Administered 2012-09-03: 125 mL/h via INTRAVENOUS
  Administered 2012-09-04 – 2012-09-09 (×13): via INTRAVENOUS

## 2012-09-02 MED ORDER — PANTOPRAZOLE SODIUM 40 MG IV SOLR
40.0000 mg | Freq: Two times a day (BID) | INTRAVENOUS | Status: DC
  Administered 2012-09-02 – 2012-09-09 (×14): 40 mg via INTRAVENOUS
  Filled 2012-09-02 (×15): qty 40

## 2012-09-02 MED ORDER — HEPARIN SODIUM (PORCINE) 5000 UNIT/ML IJ SOLN
5000.0000 [IU] | Freq: Three times a day (TID) | INTRAMUSCULAR | Status: DC
  Administered 2012-09-02 – 2012-09-09 (×21): 5000 [IU] via SUBCUTANEOUS
  Filled 2012-09-02 (×24): qty 1

## 2012-09-02 MED ORDER — HYDRALAZINE HCL 50 MG PO TABS
50.0000 mg | ORAL_TABLET | Freq: Three times a day (TID) | ORAL | Status: DC
  Administered 2012-09-02 – 2012-09-05 (×6): 50 mg via ORAL
  Filled 2012-09-02 (×12): qty 1

## 2012-09-02 MED ORDER — NEBIVOLOL HCL 10 MG PO TABS
10.0000 mg | ORAL_TABLET | Freq: Every evening | ORAL | Status: DC
  Administered 2012-09-03 – 2012-09-08 (×6): 10 mg via ORAL
  Filled 2012-09-02 (×9): qty 1

## 2012-09-02 MED ORDER — ONDANSETRON HCL 4 MG/2ML IJ SOLN
4.0000 mg | Freq: Once | INTRAMUSCULAR | Status: AC
  Administered 2012-09-02: 4 mg via INTRAVENOUS
  Filled 2012-09-02: qty 2

## 2012-09-02 MED ORDER — ONDANSETRON HCL 4 MG/2ML IJ SOLN
4.0000 mg | INTRAMUSCULAR | Status: DC | PRN
  Administered 2012-09-02 – 2012-09-08 (×15): 4 mg via INTRAVENOUS
  Filled 2012-09-02 (×15): qty 2

## 2012-09-02 MED ORDER — SODIUM CHLORIDE 0.9 % IV BOLUS (SEPSIS)
1000.0000 mL | Freq: Once | INTRAVENOUS | Status: AC
  Administered 2012-09-02: 1000 mL via INTRAVENOUS

## 2012-09-02 MED ORDER — ONDANSETRON HCL 4 MG/2ML IJ SOLN
4.0000 mg | Freq: Four times a day (QID) | INTRAMUSCULAR | Status: DC | PRN
  Administered 2012-09-02: 4 mg via INTRAVENOUS
  Filled 2012-09-02: qty 2

## 2012-09-02 MED ORDER — THIAMINE HCL 100 MG/ML IJ SOLN
100.0000 mg | Freq: Once | INTRAMUSCULAR | Status: AC
  Administered 2012-09-02: 100 mg via INTRAVENOUS
  Filled 2012-09-02: qty 1

## 2012-09-02 NOTE — H&P (Signed)
Chief Complaint: nausea, vomiting and abdominal pain HPI: Brenda Davis is a 47 year old AAF with a history of diabetes mellitus, obesity, CKD, hypertension, hyperlipidemia, gout and laparoscopic sleeve gastrectomy on 07/19/12.  She was discharged on the 4th of July with an uneventful hospitalization.  She was readmitted on August 1st with nausea and vomiting.  She had a CT scan with oral contrast that was normal.  She was treated with IVF, bowel rest, laxatives and subsequently improved and was discharged.  She presented today with ongoing nausea and vomiting which began the same day she was discharged.  She has symptoms daily, this morning while at work she was unable to stop heaving.  She has been following strict diet, consuming protein shakes but unable to keep anything down.  Associated with chills.  Denies fever.  She reports developing diarrhea on Tuesday.  Denies recent travel.  Denies cp, sob or palpations.  Denies urinary symptoms.    Past Medical History  Diagnosis Date  . Hypertension   . Diabetes mellitus   . Gout   . Depression   . Gout   . Hyperlipidemia   . Blood transfusion without reported diagnosis   . Morbid obesity   . Chronic kidney disease     STAGE 3 CHRONIC KIDNEY DISEASE SECONDARY TO DIABETIC GLOMERULOSCLEROSIS AND UNCONTROLLED HYPERTENSION - PER OFFICE NOTES DR. Florene Glen -[facility].  Marland Kitchen Headache(784.0)     CHRONIC H/A'S  . Pain     LEFT SHOULDER PAIN - WAS SEEN AT AN URGENT CARE - GIVEN SLING FOR COMFORT AND TOLD ROM AS TOLERATED.    Past Surgical History  Procedure Laterality Date  . Cesarean section  1988, 1993  . Carpal tunnel release  2008  . Laparoscopic gastric sleeve resection N/A 07/19/2012    Procedure: LAPAROSCOPIC SLEEVE GASTRECTOMY with EGD;  Surgeon: Madilyn Hook, DO;  Location: [facility];  Service: General;  Laterality: N/A;  laparoscopic sleeve gastrectomy with EGD  . Upper gi endoscopy N/A 07/19/2012    Procedure: UPPER GI ENDOSCOPY;  Surgeon:  Madilyn Hook, DO;  Location: [facility];  Service: General;  Laterality: N/A;    Family History  Problem Relation Age of Onset  . Hypertension Mother   . Diabetes Father   . Arthritis Other   . Hyperlipidemia Other   . Cancer Neg Hx   . Heart disease Neg Hx   . Kidney disease Neg Hx   . Stroke Neg Hx    Social History:  reports that she has quit smoking. Her smoking use included Cigarettes. She smoked 0.00 packs per day for 20 years. She has never used smokeless tobacco. She reports that she drinks about 0.6 ounces of alcohol per week. She reports that she does not use illicit drugs.  Allergies:  Allergies  Allergen Reactions  . Amlodipine     edema  . Clonidine Derivatives Swelling  . Welchol [Colesevelam Hcl] Nausea Only    Medications Prior to Admission  Medication Sig Dispense Refill  . hydrALAZINE (APRESOLINE) 50 MG tablet Take 1 tablet (50 mg total) by mouth every 8 (eight) hours.  90 tablet  0  . magnesium hydroxide (MILK OF MAGNESIA) 400 MG/5ML suspension Take 30 mLs by mouth daily as needed for constipation.      . nebivolol (BYSTOLIC) 10 MG tablet Take 10 mg by mouth every evening.       . ondansetron (ZOFRAN ODT) 4 MG disintegrating tablet Take 1 tablet (4 mg total) by mouth every 8 (  eight) hours as needed for nausea.  20 tablet  0  . pantoprazole (PROTONIX) 40 MG tablet Take 1 tablet (40 mg total) by mouth daily.  90 tablet  1  . rosuvastatin (CRESTOR) 20 MG tablet Take 20 mg by mouth every morning.        Results for orders placed during the hospital encounter of 09/02/12 (from the past 48 hour(s))  LACTIC ACID, PLASMA     Status: None   Collection Time    09/02/12 10:10 AM      Result Value Range   Lactic Acid, Venous 1.7  0.5 - 2.2 mmol/L  CBC     Status: Abnormal   Collection Time    09/02/12 10:11 AM      Result Value Range   WBC 6.0  4.0 - 10.5 K/uL   RBC 3.94  3.87 - 5.11 MIL/uL   Hemoglobin 10.6 (*) 12.0 - 15.0 g/dL   HCT 32.7 (*) 36.0 - 46.0 %   MCV  83.0  78.0 - 100.0 fL   MCH 26.9  26.0 - 34.0 pg   MCHC 32.4  30.0 - 36.0 g/dL   RDW 14.1  11.5 - 15.5 %   Platelets 361  150 - 400 K/uL  COMPREHENSIVE METABOLIC PANEL     Status: Abnormal   Collection Time    09/02/12 10:11 AM      Result Value Range   Sodium 135  135 - 145 mEq/L   Potassium 5.4 (*) 3.5 - 5.1 mEq/L   Chloride 101  96 - 112 mEq/L   CO2 20  19 - 32 mEq/L   Glucose, Bld 119 (*) 70 - 99 mg/dL   BUN 37 (*) 6 - 23 mg/dL   Creatinine, Ser 2.22 (*) 0.50 - 1.10 mg/dL   Calcium 9.7  8.4 - 10.5 mg/dL   Total Protein 7.6  6.0 - 8.3 g/dL   Albumin 3.0 (*) 3.5 - 5.2 g/dL   AST 32  0 - 37 U/L   ALT 40 (*) 0 - 35 U/L   Alkaline Phosphatase 165 (*) 39 - 117 U/L   Total Bilirubin 0.4  0.3 - 1.2 mg/dL   GFR calc non Af Amer 25 (*) >90 mL/min   GFR calc Af Amer 29 (*) >90 mL/min   Comment: (NOTE)     The eGFR has been calculated using the CKD EPI equation.     This calculation has not been validated in all clinical situations.     eGFR's persistently <90 mL/min signify possible Chronic Kidney     Disease.  LIPASE, BLOOD     Status: None   Collection Time    09/02/12 10:11 AM      Result Value Range   Lipase 59  11 - 59 U/L  URINALYSIS, ROUTINE W REFLEX MICROSCOPIC     Status: Abnormal   Collection Time    09/02/12 12:15 PM      Result Value Range   Color, Urine YELLOW  YELLOW   APPearance CLEAR  CLEAR   Specific Gravity, Urine 1.019  1.005 - 1.030   pH 5.5  5.0 - 8.0   Glucose, UA 100 (*) NEGATIVE mg/dL   Hgb urine dipstick TRACE (*) NEGATIVE   Bilirubin Urine SMALL (*) NEGATIVE   Ketones, ur 15 (*) NEGATIVE mg/dL   Protein, ur >300 (*) NEGATIVE mg/dL   Urobilinogen, UA 1.0  0.0 - 1.0 mg/dL   Nitrite NEGATIVE  NEGATIVE   Leukocytes, UA  NEGATIVE  NEGATIVE  URINE MICROSCOPIC-ADD ON     Status: None   Collection Time    09/02/12 12:15 PM      Result Value Range   Squamous Epithelial / LPF RARE  RARE   WBC, UA 0-2  <3 WBC/hpf   RBC / HPF 0-2  <3 RBC/hpf   Ct  Abdomen Pelvis Wo Contrast  09/02/2012   *RADIOLOGY REPORT*  Clinical Data: Epigastric pain.  CT ABDOMEN AND PELVIS WITHOUT CONTRAST  Technique:  Multidetector CT imaging of the abdomen and pelvis was performed following the standard protocol without intravenous contrast.  Comparison: 08/19/2012  Findings: The lung bases are clear.  The heart is normal in size. There is a gastric anastomosis staple line along the greater curvature of the stomach, stable.  Normal liver, spleen, gallbladder and pancreas.  No bile duct dilation.  No adrenal masses.  Normal kidneys, ureters and bladder.  The uterus and adnexa are unremarkable.  No pathologically enlarged lymph nodes.  There are no abnormal fluid collections.  There are a few scattered colonic diverticula.  The bowel is otherwise unremarkable.  A normal appendix is not visualized but there are no findings of appendicitis.  There are mild degenerative changes of the visualized spine.  IMPRESSION: No acute findings.  Stable changes from previous gastric surgery.  A few left colon diverticula.  No diverticulitis.  The bowel is otherwise unremarkable.  No change from prior CT.   Original Report Authenticated By: Lajean Manes, M.D.   Dg Abd 1 View  09/02/2012   *RADIOLOGY REPORT*  Clinical Data: Abdominal pain, nausea, vomiting  ABDOMEN - 1 VIEW  Comparison: 08/19/2012 CT  Findings:  Postop changes of the stomach.  Negative for obstruction, ileus or significant dilatation.  Lung bases clear. Pelvic calcifications consistent with venous phleboliths.  No abnormal osseous finding.  IMPRESSION: No acute finding by plain radiography   Original Report Authenticated By: Jerilynn Mages. Annamaria Boots, M.D.    Review of Systems  Constitutional: Positive for chills. Negative for fever, weight loss and malaise/fatigue.  Eyes: Negative for blurred vision, double vision and photophobia.  Respiratory: Negative for sputum production, shortness of breath and wheezing.   Cardiovascular: Negative for  chest pain, palpitations and leg swelling.  Gastrointestinal: Positive for nausea, vomiting, abdominal pain and diarrhea. Negative for blood in stool and melena.  Genitourinary: Negative for dysuria and urgency.  Neurological: Positive for headaches. Negative for dizziness, sensory change, speech change, seizures, loss of consciousness and weakness.    Blood pressure 161/89, pulse 67, temperature 98.9 F (37.2 C), temperature source Oral, resp. rate 18, SpO2 100.00%. Physical Exam  Constitutional: She is oriented to person, place, and time. She appears well-developed and well-nourished. She appears distressed.  HENT:  Head: Normocephalic and atraumatic.  Neck: Normal range of motion. Neck supple.  Cardiovascular: Normal rate, regular rhythm, normal heart sounds and intact distal pulses.  Exam reveals no gallop and no friction rub.   No murmur heard. Respiratory: Effort normal and breath sounds normal. No respiratory distress. She has no wheezes. She has no rales. She exhibits no tenderness.  GI: Soft. Bowel sounds are normal. She exhibits no distension and no mass. There is tenderness. There is no rebound and no guarding.  Musculoskeletal: She exhibits edema. She exhibits no tenderness.  Lymphadenopathy:    She has no cervical adenopathy.  Neurological: She is alert and oriented to person, place, and time.  Skin: Skin is dry. No rash noted. She is not diaphoretic. No pallor.  Psychiatric: She has a normal mood and affect. Her behavior is normal.     Assessment/Plan Nausea, vomiting and abdominal pain. 6 weeks post op laparoscopic sleeve gastrectomy.  Etiology of symptoms is unclear.  Repeat CT scan today is normal.  She does not have a white count, afebrile, VSS.  She may need an endoscopy to further evaluate.  Admit to inpatient, IV hydration, NPO, pain control, antiemetics.  CKD -stable, hydrate -avoid IV contrast  DVT prophylaxis Heparin, SCDs, ambulate  HTN -resume home  meds [place], [place] 09/02/2012, 3:26 PM   Not sure why she is having this recurrent nausea and vomiting and chills.  Abdominal exam without significant abnormality.  She needs admission due to chronic renal issues to ensure hydration.  Will try to set her up with EGD to evaluate for stricture or ulcer or other cause of her nausea.

## 2012-09-02 NOTE — Progress Notes (Signed)
Spoke to Dr. Lilyan Punt made him aware patient refusing po meds.

## 2012-09-02 NOTE — ED Notes (Signed)
Surgery PA at bedside.  

## 2012-09-02 NOTE — ED Notes (Signed)
Patient transported to X-ray 

## 2012-09-02 NOTE — Progress Notes (Addendum)
INITIAL NUTRITION ASSESSMENT  DOCUMENTATION CODES Per approved criteria  -Morbid Obesity   INTERVENTION: - Diet advancement and anti-emetics per MD - Recommend nursing get updated weight for this admission - Will continue to monitor   NUTRITION DIAGNOSIS: Inadequate oral intake related to persistent nausea/vomiting as evidenced by pt report.   Goal: 1. Resolution of nausea/vomiting 2. Advance as tolerated to bariatric clear liquid diet  Monitor:  Weights, labs, diet advancement, nausea/vomiting   Reason for Assessment: Nutrition risk   47 y.o. female  Admitting Dx: Nausea, vomiting, abdominal pain  ASSESSMENT: Pt is s/p sleeve gastrectomy on 07/19/12. Pt followed by outpatient bariatric RD and was instructed on mechanical soft, high protein diet phase on 08/02/12. Pt recently admitted from 8/1-8/4 for abdominal pain/gurgling in stomach with nausea/dry heaves. Pt reported she was tolerating solid foods on day of discharge (08/22/12) but then threw it up when she got home.   Met with pt and mother. Pt reports having nausea and vomiting that started 08/17/12. Pt reports not being able to eat anything at all on Wednesday. States she was able to eat a chicken wing without skin yesterday for lunch with 2 teaspoons of spinach. She reports yesterday evening she tried to eat a slice of chicken with 2% cheese but vomited it up 3 minutes later, then had another episode of emesis 2 hours later that was yellow in color, foul tasting, and looked like bile. Pt reports this morning she had a sip of her protein shake but then threw it up. States she felt better after sips of water and went to work but there she didn't feel well and had 5 minutes of straight vomiting that was so bad there were times she felt like she couldn't breathe. EMS was called and pt states she was told that there was no pulse when they arrived but then they found her pulse. Pt tearful during visit, emotional support provided. Pt  frustrated about having nausea/vomiting. Pt shaking during visit, states she goes through being hot and having chills since surgery - notified RN.   Noted pt with elevated potassium, BUN/Cr with low GFR and elevated Alk phos and ALT. Pt with stage 3 CKD.   No weight yet from this admission, however pt weighed 309 pounds in March 2014 and weighed 273 pounds the beginning of this month, a 36 pound, 11.6% body weight loss.   Height: Ht Readings from Last 1 Encounters:  08/19/12 5\' 4"  (1.626 m)    Weight: Wt Readings from Last 1 Encounters:  08/19/12 273 lb (123.832 kg)    Ideal Body Weight: 120 lb   % Ideal Body Weight: 227% (based on weight from 08/19/12)  Wt Readings from Last 10 Encounters:  08/19/12 273 lb (123.832 kg)  08/19/12 273 lb 12.8 oz (124.195 kg)  08/11/12 281 lb 9.6 oz (127.733 kg)  08/02/12 283 lb (128.368 kg)  07/19/12 300 lb 14.9 oz (136.5 kg)  07/19/12 300 lb 14.9 oz (136.5 kg)  07/15/12 297 lb 12.8 oz (135.081 kg)  07/15/12 296 lb 12.8 oz (134.628 kg)  07/11/12 297 lb (134.718 kg)  06/28/12 293 lb 3.2 oz (132.995 kg)    Usual Body Weight: 309 lb in March 2014  % Usual Body Weight: 88% (based on weight from 08/19/12)  BMI:  46.9 kg/(m^2) per most recent weight earlier in the month  Estimated Nutritional Needs (based on weight from 08/19/12): Kcal: [id number] Protein: 80-90g Fluid: 1.4-1.6L/day  Skin: Intact   Diet Order: NPO  EDUCATION  NEEDS: -No education needs identified at this time  No intake or output data in the 24 hours ending 09/02/12 1649  Last BM: 8/15  Labs:   Recent Labs Lab 09/02/12 1011  NA 135  K 5.4*  CL 101  CO2 20  BUN 37*  CREATININE 2.22*  CALCIUM 9.7  GLUCOSE 119*    CBG (last 3)  No results found for this basename: GLUCAP,  in the last 72 hours  Scheduled Meds: . heparin  5,000 Units Subcutaneous Q8H  . hydrALAZINE  50 mg Oral Q8H  . nebivolol  10 mg Oral QPM  . pantoprazole (PROTONIX) IV  40 mg Intravenous  Q12H  . [START ON 09/03/2012] thiamine  100 mg Intravenous Daily    Continuous Infusions: . sodium chloride 125 mL/hr at 09/02/12 1333    Past Medical History  Diagnosis Date  . Hypertension   . Diabetes mellitus   . Gout   . Depression   . Gout   . Hyperlipidemia   . Blood transfusion without reported diagnosis   . Morbid obesity   . Chronic kidney disease     STAGE 3 CHRONIC KIDNEY DISEASE SECONDARY TO DIABETIC GLOMERULOSCLEROSIS AND UNCONTROLLED HYPERTENSION - PER OFFICE NOTES DR. Florene Glen -[facility].  Marland Kitchen Headache(784.0)     CHRONIC H/A'S  . Pain     LEFT SHOULDER PAIN - WAS SEEN AT AN URGENT CARE - GIVEN SLING FOR COMFORT AND TOLD ROM AS TOLERATED.    Past Surgical History  Procedure Laterality Date  . Cesarean section  1988, 1993  . Carpal tunnel release  2008  . Laparoscopic gastric sleeve resection N/A 07/19/2012    Procedure: LAPAROSCOPIC SLEEVE GASTRECTOMY with EGD;  Surgeon: Madilyn Hook, DO;  Location: [facility];  Service: General;  Laterality: N/A;  laparoscopic sleeve gastrectomy with EGD  . Upper gi endoscopy N/A 07/19/2012    Procedure: UPPER GI ENDOSCOPY;  Surgeon: Madilyn Hook, DO;  Location: [facility];  Service: General;  Laterality: N/A;    Mikey College MS, [place], [place] Pager [phone] After Hours Pager

## 2012-09-02 NOTE — ED Notes (Addendum)
MD at bedside. 

## 2012-09-02 NOTE — ED Notes (Addendum)
Per EMS, Pt, from home, c/o n/v/d x 2 weeks.  Hx of gastric sleeve placed 7/1.  Pt sts she was admitted to [facility] on 8/1 for same complaint and and discharged 8/4.  Pt sts "I never really got better."  Pt had a hypotensive episode in route.  A & Ox4.  Hx of DM, HTN, and Stage 2 renal failure.  Pt sts n/v/d began 7/29.

## 2012-09-02 NOTE — ED Notes (Signed)
Bed: [id number] Expected date:  Expected time:  Means of arrival:  Comments: EMS-N/V

## 2012-09-02 NOTE — ED Notes (Addendum)
Pt sts she has not been able to take medications.  Sts "I just gag and throw them right back up."  Sts I was able to eat a solid meal before I was discharged on 8/4, but I threw it up when I got home."

## 2012-09-02 NOTE — ED Notes (Signed)
Patient transported to CT 

## 2012-09-02 NOTE — ED Provider Notes (Signed)
CSN: [id number]     Arrival date & time 09/02/12  [id number] History     First MD Initiated Contact with Patient 09/02/12 [phone]     Chief Complaint  Patient presents with  . Emesis  . Diarrhea   (Consider location/radiation/quality/duration/timing/severity/associated sxs/prior Treatment) HPI Comments: Hx of recent gastric sleeve placement 6 weeks ago. Admitted 2 weeks ago here for N/V - CT at that time without abnormality. Symptoms resolved in the hospital after laxatives.  Patient states normal BMs, last one this morning. 4-5 BMs per day, not currently taking laxatives.  Patient is a 47 y.o. female presenting with vomiting and diarrhea. The history is provided by the patient.  Emesis Severity:  Moderate Duration:  2 weeks Timing:  Constant Number of daily episodes:  4-5 Quality:  Stomach contents Feeding tolerance: occasionally tolerating liquids and solids. Progression:  Unchanged Chronicity:  New Recent urination:  Normal Context: not post-tussive and not self-induced   Relieved by:  Nothing Worsened by:  Nothing tried Ineffective treatments:  None tried Associated symptoms: abdominal pain (central), chills and diarrhea   Associated symptoms: no cough, no fever and no URI   Diarrhea Associated symptoms: abdominal pain (central), chills and vomiting   Associated symptoms: no recent cough and no URI     Past Medical History  Diagnosis Date  . Hypertension   . Diabetes mellitus   . Gout   . Depression   . Gout   . Hyperlipidemia   . Blood transfusion without reported diagnosis   . Morbid obesity   . Chronic kidney disease     STAGE 3 CHRONIC KIDNEY DISEASE SECONDARY TO DIABETIC GLOMERULOSCLEROSIS AND UNCONTROLLED HYPERTENSION - PER OFFICE NOTES DR. Flore[facility].  Marland Kitchen Headache(784.0)     CHRONIC H/A'S  . Pain     LEFT SHOULDER PAIN - WAS SEEN AT AN URGENT CARE - GIVEN SLING FOR COMFORT AND TOLD ROM AS TOLERATED.   Past Surgical History  Procedure  Laterality Date  . Cesarean section  1988, 1993  . Carpal tunnel release  2008  . Laparoscopic gastric sleeve resection N/A 07/19/2012    Procedure: LAPAROSCOPIC SLEEVE GASTRECTOMY with EGD;  Surgeon: Madilyn Hook, DO;  Location: [facility];  Service: General;  Laterality: N/A;  laparoscopic sleeve gastrectomy with EGD  . Upper gi endoscopy N/A 07/19/2012    Procedure: UPPER GI ENDOSCOPY;  Surgeon: Madilyn Hook, DO;  Location: [facility];  Service: General;  Laterality: N/A;   Family History  Problem Relation Age of Onset  . Hypertension Mother   . Diabetes Father   . Arthritis Other   . Hyperlipidemia Other   . Cancer Neg Hx   . Heart disease Neg Hx   . Kidney disease Neg Hx   . Stroke Neg Hx    History  Substance Use Topics  . Smoking status: Former Smoker -- 20 years    Types: Cigarettes  . Smokeless tobacco: Never Used     Comment: QUIT SMOKING ABOUT 2009  . Alcohol Use: 0.6 oz/week    1 Glasses of wine per week   OB History   Grav Para Term Preterm Abortions TAB SAB Ect Mult Living                 Review of Systems  Constitutional: Positive for chills.  Respiratory: Negative for cough and shortness of breath.   Gastrointestinal: Positive for vomiting, abdominal pain (central) and diarrhea.  All other systems reviewed and are negative.  Allergies  Amlodipine; Clonidine derivatives; and Welchol  Home Medications   Current Outpatient Rx  Name  Route  Sig  Dispense  Refill  . hydrALAZINE (APRESOLINE) 50 MG tablet   Oral   Take 1 tablet (50 mg total) by mouth every 8 (eight) hours.   90 tablet   0   . magnesium hydroxide (MILK OF MAGNESIA) 400 MG/5ML suspension   Oral   Take 30 mLs by mouth daily as needed for constipation.         . nebivolol (BYSTOLIC) 10 MG tablet   Oral   Take 10 mg by mouth every evening.          . ondansetron (ZOFRAN ODT) 4 MG disintegrating tablet   Oral   Take 1 tablet (4 mg total) by mouth every 8 (eight) hours as needed for  nausea.   20 tablet   0   . pantoprazole (PROTONIX) 40 MG tablet   Oral   Take 1 tablet (40 mg total) by mouth daily.   90 tablet   1   . rosuvastatin (CRESTOR) 20 MG tablet   Oral   Take 20 mg by mouth every morning.          BP 140/62  Pulse 62  Temp(Src) 98.6 F (37 C) (Oral)  Resp 14  SpO2 99% Physical Exam  Nursing note and vitals reviewed. Constitutional: She is oriented to person, place, and time. She appears well-developed and well-nourished. No distress.  HENT:  Head: Normocephalic and atraumatic.  Eyes: EOM are normal. Pupils are equal, round, and reactive to light.  Neck: Normal range of motion. Neck supple.  Cardiovascular: Normal rate and regular rhythm.  Exam reveals no friction rub.   No murmur heard. Pulmonary/Chest: Effort normal and breath sounds normal. No respiratory distress. She has no wheezes. She has no rales.  Abdominal: Soft. She exhibits no distension. There is tenderness (LUQ and central, no RUQ). There is no rebound and no guarding.  Musculoskeletal: Normal range of motion. She exhibits no edema.  Neurological: She is alert and oriented to person, place, and time.  Skin: She is not diaphoretic.    ED Course   Procedures (including critical care time)  Labs Reviewed  CBC - Abnormal; Notable for the following:    Hemoglobin 10.6 (*)    HCT 32.7 (*)    All other components within normal limits  COMPREHENSIVE METABOLIC PANEL - Abnormal; Notable for the following:    Potassium 5.4 (*)    Glucose, Bld 119 (*)    BUN 37 (*)    Creatinine, Ser 2.22 (*)    Albumin 3.0 (*)    ALT 40 (*)    Alkaline Phosphatase 165 (*)    GFR calc non Af Amer 25 (*)    GFR calc Af Amer 29 (*)    All other components within normal limits  URINALYSIS, ROUTINE W REFLEX MICROSCOPIC - Abnormal; Notable for the following:    Glucose, UA 100 (*)    Hgb urine dipstick TRACE (*)    Bilirubin Urine SMALL (*)    Ketones, ur 15 (*)    Protein, ur >300 (*)    All  other components within normal limits  LIPASE, BLOOD  LACTIC ACID, PLASMA  URINE MICROSCOPIC-ADD ON  CBC  COMPREHENSIVE METABOLIC PANEL   Ct Abdomen Pelvis Wo Contrast  09/02/2012   *RADIOLOGY REPORT*  Clinical Data: Epigastric pain.  CT ABDOMEN AND PELVIS WITHOUT CONTRAST  Technique:  Multidetector CT  imaging of the abdomen and pelvis was performed following the standard protocol without intravenous contrast.  Comparison: 08/19/2012  Findings: The lung bases are clear.  The heart is normal in size. There is a gastric anastomosis staple line along the greater curvature of the stomach, stable.  Normal liver, spleen, gallbladder and pancreas.  No bile duct dilation.  No adrenal masses.  Normal kidneys, ureters and bladder.  The uterus and adnexa are unremarkable.  No pathologically enlarged lymph nodes.  There are no abnormal fluid collections.  There are a few scattered colonic diverticula.  The bowel is otherwise unremarkable.  A normal appendix is not visualized but there are no findings of appendicitis.  There are mild degenerative changes of the visualized spine.  IMPRESSION: No acute findings.  Stable changes from previous gastric surgery.  A few left colon diverticula.  No diverticulitis.  The bowel is otherwise unremarkable.  No change from prior CT.   Original Report Authenticated By: Lajean Manes, M.D.   Dg Abd 1 View  09/02/2012   *RADIOLOGY REPORT*  Clinical Data: Abdominal pain, nausea, vomiting  ABDOMEN - 1 VIEW  Comparison: 08/19/2012 CT  Findings:  Postop changes of the stomach.  Negative for obstruction, ileus or significant dilatation.  Lung bases clear. Pelvic calcifications consistent with venous phleboliths.  No abnormal osseous finding.  IMPRESSION: No acute finding by plain radiography   Original Report Authenticated By: Jerilynn Mages. Shick, M.D.   1. Nausea and vomiting   2. Abdominal pain     MDM  21F with recent gastric sleeve surgery presents with N/V/D. Vomiting occurs very soon  after eating. Unable to tolerate liquids/solids. Nonbilious, nonbloody vomiting, nonbloody diarrhea. Admitted for similar symptoms 2 weeks ago, normal CT at that time. Surgery did not adjust her sleeve at that time. Symptoms resolved after laxatives. AFVSS here. Central abdominal pain on exam. Will start with labs and KUB to look for possible large stool burden. Surgery consulted and will admit.   Osvaldo Shipper, MD 09/02/12 [phone]

## 2012-09-02 NOTE — Progress Notes (Signed)
Spoke to Dr. Harlow Asa made aware patient states zofran works well for her nausea but is nauseated again and dose not due. Orders given for dose frequency change.

## 2012-09-03 LAB — COMPREHENSIVE METABOLIC PANEL
ALT: 30 U/L (ref 0–35)
AST: 24 U/L (ref 0–37)
Albumin: 2.5 g/dL — ABNORMAL LOW (ref 3.5–5.2)
Alkaline Phosphatase: 128 U/L — ABNORMAL HIGH (ref 39–117)
BUN: 30 mg/dL — ABNORMAL HIGH (ref 6–23)
CO2: 20 mEq/L (ref 19–32)
Calcium: 8.9 mg/dL (ref 8.4–10.5)
Chloride: 109 mEq/L (ref 96–112)
Creatinine, Ser: 2.43 mg/dL — ABNORMAL HIGH (ref 0.50–1.10)
GFR calc Af Amer: 26 mL/min — ABNORMAL LOW (ref >90)
GFR calc non Af Amer: 23 mL/min — ABNORMAL LOW (ref >90)
Glucose, Bld: 91 mg/dL (ref 70–99)
Potassium: 4.2 mEq/L (ref 3.5–5.1)
Sodium: 141 mEq/L (ref 135–145)
Total Bilirubin: 0.3 mg/dL (ref 0.3–1.2)
Total Protein: 6.2 g/dL (ref 6.0–8.3)

## 2012-09-03 LAB — CBC
HCT: 29.6 % — ABNORMAL LOW (ref 36.0–46.0)
Hemoglobin: 9.3 g/dL — ABNORMAL LOW (ref 12.0–15.0)
MCH: 26.6 pg (ref 26.0–34.0)
MCHC: 31.4 g/dL (ref 30.0–36.0)
MCV: 84.8 fL (ref 78.0–100.0)
Platelets: 320 10*3/uL (ref 150–400)
RBC: 3.49 MIL/uL — ABNORMAL LOW (ref 3.87–5.11)
RDW: 14.7 % (ref 11.5–15.5)
WBC: 4.8 10*3/uL (ref 4.0–10.5)

## 2012-09-03 LAB — GLUCOSE, CAPILLARY
Glucose-Capillary: 81 mg/dL (ref 70–99)
Glucose-Capillary: 84 mg/dL (ref 70–99)
Glucose-Capillary: 84 mg/dL (ref 70–99)
Glucose-Capillary: 91 mg/dL (ref 70–99)
Glucose-Capillary: 93 mg/dL (ref 70–99)
Glucose-Capillary: 97 mg/dL (ref 70–99)

## 2012-09-03 NOTE — Progress Notes (Signed)
General Surgery Note  LOS: 1 day  POD - 47  Assessment/Plan: 1. Persistent nausea and vomiting  On Protonix, Zofran  She has had two CT scans - 08/19/2012 and 09/02/2012 that show no extraluminal abnormality.  Tolerating ice and small sips of water, but still has epigastric pain/discomfort  I discussed endoscopy with her.  2.  Gastric sleeve - 07/19/2012 - B. [facility]ton  3.  HTN 4.  Diabetes mellitus 5.  Morbid obesity 6.  History of headaches. 7.  Chronic renal insufficiency  Creat - 2.43 - 09/03/2012   8. DVT prophylaxis - on SQ Heparin  Subjective:  Tolerating ice and small sips of water, but still has epigastric pain/discomfort.  Passing flatus.   Objective:   Filed Vitals:   09/03/12 0552  BP: 106/57  Pulse: 69  Temp: 98.5 F (36.9 C)  Resp: 18     Intake/Output from previous day:  08/15 0701 - 08/16 0700 In: 2052.1 [I.V.:2052.1] Out: -   Intake/Output this shift:      Physical Exam:   General: Obese AA F who is alert and oriented.    HEENT: Normal. Pupils equal. .   Lungs: Clear   Abdomen: Epigastric discomfort, but no peritoneal sxes.  BS present.   Lab Results:    Recent Labs  09/02/12 1011 09/03/12 0430  WBC 6.0 4.8  HGB 10.6* 9.3*  HCT 32.7* 29.6*  PLT 361 320    BMET   Recent Labs  09/02/12 1011 09/03/12 0430  NA 135 141  K 5.4* 4.2  CL 101 109  CO2 20 20  GLUCOSE 119* 91  BUN 37* 30*  CREATININE 2.22* 2.43*  CALCIUM 9.7 8.9    PT/INR  No results found for this basename: LABPROT, INR,  in the last 72 hours  ABG  No results found for this basename: PHART, PCO2, PO2, HCO3,  in the last 72 hours   Studies/Results:  Ct Abdomen Pelvis Wo Contrast  09/02/2012   *RADIOLOGY REPORT*  Clinical Data: Epigastric pain.  CT ABDOMEN AND PELVIS WITHOUT CONTRAST  Technique:  Multidetector CT imaging of the abdomen and pelvis was performed following the standard protocol without intravenous contrast.  Comparison: 08/19/2012  Findings: The lung bases are  clear.  The heart is normal in size. There is a gastric anastomosis staple line along the greater curvature of the stomach, stable.  Normal liver, spleen, gallbladder and pancreas.  No bile duct dilation.  No adrenal masses.  Normal kidneys, ureters and bladder.  The uterus and adnexa are unremarkable.  No pathologically enlarged lymph nodes.  There are no abnormal fluid collections.  There are a few scattered colonic diverticula.  The bowel is otherwise unremarkable.  A normal appendix is not visualized but there are no findings of appendicitis.  There are mild degenerative changes of the visualized spine.  IMPRESSION: No acute findings.  Stable changes from previous gastric surgery.  A few left colon diverticula.  No diverticulitis.  The bowel is otherwise unremarkable.  No change from prior CT.   Original Report Authenticated By: Lajean Manes, M.D.   Dg Abd 1 View  09/02/2012   *RADIOLOGY REPORT*  Clinical Data: Abdominal pain, nausea, vomiting  ABDOMEN - 1 VIEW  Comparison: 08/19/2012 CT  Findings:  Postop changes of the stomach.  Negative for obstruction, ileus or significant dilatation.  Lung bases clear. Pelvic calcifications consistent with venous phleboliths.  No abnormal osseous finding.  IMPRESSION: No acute finding by plain radiography   Original Report  Authenticated By: Jerilynn Mages. Annamaria Boots, M.D.     Anti-infectives:   Anti-infectives   None      Alphonsa Overall, MD, FACS Pager: [phone],   [facility] Surgery Office: [phone] 09/03/2012

## 2012-09-04 ENCOUNTER — Encounter (HOSPITAL_COMMUNITY)
Admission: EM | Disposition: A | Discharge status: Home / Self Care | Payer: Self-pay | Source: Home / Self Care | Attending: General Surgery

## 2012-09-04 HISTORY — PX: ESOPHAGOGASTRODUODENOSCOPY: SHX5428

## 2012-09-04 LAB — BASIC METABOLIC PANEL
BUN: 23 mg/dL (ref 6–23)
CO2: 20 mEq/L (ref 19–32)
Calcium: 8.9 mg/dL (ref 8.4–10.5)
Chloride: 111 mEq/L (ref 96–112)
Creatinine, Ser: 2.37 mg/dL — ABNORMAL HIGH (ref 0.50–1.10)
GFR calc Af Amer: 27 mL/min — ABNORMAL LOW (ref >90)
GFR calc non Af Amer: 23 mL/min — ABNORMAL LOW (ref >90)
Glucose, Bld: 102 mg/dL — ABNORMAL HIGH (ref 70–99)
Potassium: 4.3 mEq/L (ref 3.5–5.1)
Sodium: 141 mEq/L (ref 135–145)

## 2012-09-04 LAB — GLUCOSE, CAPILLARY
Glucose-Capillary: 108 mg/dL — ABNORMAL HIGH (ref 70–99)
Glucose-Capillary: 192 mg/dL — ABNORMAL HIGH (ref 70–99)
Glucose-Capillary: 68 mg/dL — ABNORMAL LOW (ref 70–99)
Glucose-Capillary: 79 mg/dL (ref 70–99)
Glucose-Capillary: 83 mg/dL (ref 70–99)
Glucose-Capillary: 89 mg/dL (ref 70–99)
Glucose-Capillary: 98 mg/dL (ref 70–99)

## 2012-09-04 SURGERY — EGD (ESOPHAGOGASTRODUODENOSCOPY)
Anesthesia: Moderate Sedation

## 2012-09-04 MED ORDER — BUTAMBEN-TETRACAINE-BENZOCAINE 2-2-14 % EX AERO
INHALATION_SPRAY | CUTANEOUS | Status: DC | PRN
  Administered 2012-09-04: 2 via TOPICAL

## 2012-09-04 MED ORDER — DEXTROSE 50 % IV SOLN
INTRAVENOUS | Status: AC
  Filled 2012-09-04: qty 50

## 2012-09-04 MED ORDER — DEXTROSE 50 % IV SOLN
25.0000 mL | Freq: Once | INTRAVENOUS | Status: AC | PRN
  Administered 2012-09-04: 25 mL via INTRAVENOUS

## 2012-09-04 MED ORDER — FENTANYL CITRATE 0.05 MG/ML IJ SOLN
INTRAMUSCULAR | Status: DC | PRN
  Administered 2012-09-04 (×2): 25 ug via INTRAVENOUS

## 2012-09-04 MED ORDER — MIDAZOLAM HCL 10 MG/2ML IJ SOLN
INTRAMUSCULAR | Status: DC | PRN
  Administered 2012-09-04 (×2): 2.5 mg via INTRAVENOUS

## 2012-09-04 MED ORDER — CHLORHEXIDINE GLUCONATE 0.12 % MT SOLN
15.0000 mL | Freq: Two times a day (BID) | OROMUCOSAL | Status: DC
  Administered 2012-09-04 – 2012-09-05 (×4): 15 mL via OROMUCOSAL
  Filled 2012-09-04 (×6): qty 15

## 2012-09-04 NOTE — Op Note (Signed)
09/02/2012 - 09/04/2012  12:29 PM  PATIENT:  Brenda Davis, 47 y.o., female, MRN: [id number]  PREOP DIAGNOSIS:  History of sleeve gastrectomy, persistant nausea and vomiting  POSTOP DIAGNOSIS:   Sleeve gastrectomy with normal appearing anatomy  PROCEDURE:  Esophagogastroduodenoscopy  SURGEON:   Alphonsa Overall, M.D.  ANESTHESIA:   Fentanyl  50 mcg   Versed 4 mg  INDICATIONS FOR PROCEDURE:  Brenda Davis is a 47 y.o. (DOB: 10-12-1965)  AA  female whose primary care physician is Scarlette Calico, MD and comes for upper endoscopy to evaluate persistent nausea and vomiting post sleeve gastrectomy.  The patient had a gastric sleeve on 7/12014 by Dr. Lilyan Punt.   The indications and risks of the endoscopy were explained to the patient.  The risks include, but are not limited to, perforation, bleeding, or injury to the bowel.  If balloon dilatation is needed, the risk of perforation is higher.  PROCEDURE:  The patient was in room 3 at [facility] endoscopy unit.  The patient was monitored with a pulse oximetry, BP cuff, and EKG.  The patient has nasal O2 flowing during the procedure.   The back of the throat was anesthestized with Ceticaine x 3.  The patient was positioned in the left lateral decubitus position.  The patient was given Fentanyl and Versed.  A flexible Pentax endoscope was passed down the throat without difficulty.   Findings include:   Esophagus:   Normal   GE junction at:  38 cm   Stomach pouch: Normal appearing without stricture or ulcer   Distal staple line of sleeve:   55 cm   Pylorus distance:  60   Duodenum:  Normal, though the 1st portion of the duodenum was straighter that I would have thought.   CLO test:  Not done - no gastritis  PLAN:   Photos taken and given to patient.   By endoscopy, it is unclear why the patient has persistent nausea and vomiting.  Will discuss with Dr. Beau Fanny, MD, [facility] Surgery Pager: [phone] Office phone:   [phone]

## 2012-09-04 NOTE — Progress Notes (Signed)
Patient states she has been vomiting.  Noted to have some phlegm and small amt of clear fluid in the emesis basin less than 25 cc

## 2012-09-04 NOTE — Progress Notes (Signed)
General Surgery Note  LOS: 2 days  POD - 48  Assessment/Plan: 1. Persistent nausea and vomiting  On Protonix, Zofran  She has had two CT scans - 08/19/2012 and 09/02/2012 that show no extraluminal abnormality.  For endoscopy today.  2.  Gastric sleeve - 07/19/2012 - B. [facility]ton 3.  HTN 4.  Diabetes mellitus 5.  Morbid obesity 6.  History of headaches. 7.  Chronic renal insufficiency  Creat - 2.37 - 09/04/2012 8. DVT prophylaxis - on SQ Heparin  Subjective:  Still nauseated with vomiting anything beyond ice or small amounts of liquids.  Objective:   Filed Vitals:   09/04/12 1220  BP: 147/86  Pulse:   Temp:   Resp:      Intake/Output from previous day:  08/16 0701 - 08/17 0700 In: 1504.2 [I.V.:1504.2] Out: 0   Intake/Output this shift:      Physical Exam:   General: Obese AA F who is alert and oriented.    HEENT: Normal. Pupils equal. .   Lungs: Clear   Abdomen: Epigastric discomfort, but no peritoneal sxes.  BS present.   Lab Results:     Recent Labs  09/02/12 1011 09/03/12 0430  WBC 6.0 4.8  HGB 10.6* 9.3*  HCT 32.7* 29.6*  PLT 361 320    BMET    Recent Labs  09/03/12 0430 09/04/12 0355  NA 141 141  K 4.2 4.3  CL 109 111  CO2 20 20  GLUCOSE 91 102*  BUN 30* 23  CREATININE 2.43* 2.37*  CALCIUM 8.9 8.9    PT/INR  No results found for this basename: LABPROT, INR,  in the last 72 hours  ABG  No results found for this basename: PHART, PCO2, PO2, HCO3,  in the last 72 hours   Studies/Results:  No results found.   Anti-infectives:   Anti-infectives   None      Alphonsa Overall, MD, FACS Pager: [phone],   [facility] Surgery Office: [phone] 09/04/2012

## 2012-09-04 NOTE — Progress Notes (Signed)
Hypoglycemic Event  CBG:68  Treatment: D50 IV 25 mL  Symptoms: None  Follow-up CBG: Time:2030 CBG Result:108  Possible Reasons for Event: Inadequate meal intake  Comments/MD notified:gerkin    Cecilie Lowers  Remember to initiate Hypoglycemia Order Set & complete

## 2012-09-05 ENCOUNTER — Encounter (HOSPITAL_COMMUNITY): Payer: Self-pay | Admitting: Surgery

## 2012-09-05 ENCOUNTER — Inpatient Hospital Stay (HOSPITAL_COMMUNITY): Payer: BC Managed Care – PPO

## 2012-09-05 LAB — BASIC METABOLIC PANEL
BUN: 18 mg/dL (ref 6–23)
CO2: 21 mEq/L (ref 19–32)
Calcium: 8.3 mg/dL — ABNORMAL LOW (ref 8.4–10.5)
Chloride: 113 mEq/L — ABNORMAL HIGH (ref 96–112)
Creatinine, Ser: 2.25 mg/dL — ABNORMAL HIGH (ref 0.50–1.10)
GFR calc Af Amer: 29 mL/min — ABNORMAL LOW (ref >90)
GFR calc non Af Amer: 25 mL/min — ABNORMAL LOW (ref >90)
Glucose, Bld: 149 mg/dL — ABNORMAL HIGH (ref 70–99)
Potassium: 4.2 mEq/L (ref 3.5–5.1)
Sodium: 141 mEq/L (ref 135–145)

## 2012-09-05 LAB — GLUCOSE, CAPILLARY
Glucose-Capillary: 136 mg/dL — ABNORMAL HIGH (ref 70–99)
Glucose-Capillary: 83 mg/dL (ref 70–99)
Glucose-Capillary: 99 mg/dL (ref 70–99)
Glucose-Capillary: 92 mg/dL (ref 70–99)
Glucose-Capillary: 97 mg/dL (ref 70–99)
Glucose-Capillary: 95 mg/dL (ref 70–99)

## 2012-09-05 MED ORDER — IOHEXOL 300 MG/ML  SOLN: 50.0000 mL | Freq: Once | INTRAMUSCULAR | Status: AC | PRN

## 2012-09-05 NOTE — Progress Notes (Signed)
1 Day Post-Op  Subjective: Several BM's yesterday.  Still nauseated but spitting up foam and not the food  Objective: Vital signs in last 24 hours: Temp:  [98.2 F (36.8 C)-99.5 F (37.5 C)] 98.2 F (36.8 C) (08/18 0513) Pulse Rate:  [68-76] 68 (08/18 0513) Resp:  [15-18] 16 (08/18 0513) BP: (120-179)/(55-100) 134/69 mmHg (08/18 0513) SpO2:  [96 %-100 %] 100 % (08/18 0513) Last BM Date: 09/04/12  Intake/Output from previous day: 08/17 0701 - 08/18 0700 In: 5217.5 [P.O.:780; I.V.:4437.5] Out: 1225 [Urine:1225] Intake/Output this shift:    General appearance: alert, cooperative and no distress Resp: clear to auscultation bilaterally Cardio: S1, S2 normal and normal rate, regular GI: soft, NT, ND  Lab Results:   Recent Labs  09/02/12 1011 09/03/12 0430  WBC 6.0 4.8  HGB 10.6* 9.3*  HCT 32.7* 29.6*  PLT 361 320   BMET  Recent Labs  09/04/12 0355 09/05/12 0355  NA 141 141  K 4.3 4.2  CL 111 113*  CO2 20 21  GLUCOSE 102* 149*  BUN 23 18  CREATININE 2.37* 2.25*  CALCIUM 8.9 8.3*   PT/INR No results found for this basename: LABPROT, INR,  in the last 72 hours ABG No results found for this basename: PHART, PCO2, PO2, HCO3,  in the last 72 hours  Studies/Results: No results found.  Anti-infectives: Anti-infectives   None      Assessment/Plan: s/p Procedure(s) with comments: ESOPHAGOGASTRODUODENOSCOPY (EGD) (N/A) - PF Nausea of uncertain etiology.  no evidence of obstruction or other cause.  I really am not sure what to offer at this point.  will stop hydralazine since BP okay and this is new since her procedure.  she is off her vitamins as well which can cause the nausea.  will ask nutritionist to evaluate and educate but if unable to control nausea will need to consider Dobhoff tube for nutrition.  LOS: 3 days    Madilyn Hook DAVID 09/05/2012

## 2012-09-05 NOTE — Progress Notes (Signed)
NUTRITION FOLLOW UP  Intervention:   - Anti-emetics per MD, perhaps pt could benefit from Phenergan - Will continue to monitor closely   Nutrition Dx:   Inadequate oral intake related to persistent nausea/vomiting as evidenced by pt report - ongoing    Goal:   1. Resolution of nausea/vomiting - not met  2. Advance as tolerated to bariatric clear liquid diet - met    Monitor:   Weights, labs, nausea/vomiting  Assessment:   Weight taken 09/02/12 was 263 pounds showing a 46 pound weight loss since March 2014.   Met with pt who reports still having ongoing nausea. Reports having emesis that is spit, not any food/beverage. States the longest time she has gone without vomiting all weekend has been 6 hours. Reports the only thing she had to eat/drink yesterday was 2 cups of straight crangrape juice - pt was on regular clear liquid diet over the weekend, RD changed to bariatric clear liquid this morning with MD approval. Pt reports tolerating juice yesterday. Pt reports today she had a 2 bites of jello and had spit emesis at 8am, none since then. Noted plans for possible dobhoff tube if nausea does not improve. Pt had EGD yesterday that showed normal appearing GE junction without stricture or ulcer. Pt getting scheduled Protonix and PRN Zofran which pt has been getting every 4 hours and is still having nausea.   Pt reported following bariatric diet strictly PTA and taking bariatric multivitamins regularly every morning. Reports eating small pieces of food and chewing them very thoroughly.   Height: Ht Readings from Last 1 Encounters:  09/02/12 5\' 5"  (1.651 m)    Weight Status:   Wt Readings from Last 1 Encounters:  09/02/12 263 lb 3.2 oz (119.387 kg)    Re-estimated needs:  Kcal: 1400-1650 Protein: 80-90g Fluid: 1.4-1.6L/day  Skin: Intact  Diet Order: Bariatric clear   Intake/Output Summary (Last 24 hours) at 09/05/12 1110 Last data filed at 09/05/12 1000  Gross per 24 hour   Intake   3780 ml  Output    825 ml  Net   2955 ml    Last BM: 8/17   Labs:   Recent Labs Lab 09/03/12 0430 09/04/12 0355 09/05/12 0355  NA 141 141 141  K 4.2 4.3 4.2  CL 109 111 113*  CO2 20 20 21   BUN 30* 23 18  CREATININE 2.43* 2.37* 2.25*  CALCIUM 8.9 8.9 8.3*  GLUCOSE 91 102* 149*    CBG (last 3)   Recent Labs  09/04/12 2335 09/05/12 0353 09/05/12 0809  GLUCAP 83 136* 99    Scheduled Meds: . chlorhexidine  15 mL Mouth/Throat BID  . heparin  5,000 Units Subcutaneous Q8H  . insulin aspart  0-15 Units Subcutaneous Q4H  . nebivolol  10 mg Oral QPM  . pantoprazole (PROTONIX) IV  40 mg Intravenous Q12H  . thiamine  100 mg Intravenous Daily    Continuous Infusions: . sodium chloride 125 mL/hr at 09/04/12 [place], [place], [place] Pager [phone] After Hours Pager

## 2012-09-06 LAB — GLUCOSE, CAPILLARY
Glucose-Capillary: 71 mg/dL (ref 70–99)
Glucose-Capillary: 77 mg/dL (ref 70–99)
Glucose-Capillary: 79 mg/dL (ref 70–99)
Glucose-Capillary: 81 mg/dL (ref 70–99)
Glucose-Capillary: 83 mg/dL (ref 70–99)
Glucose-Capillary: 85 mg/dL (ref 70–99)
Glucose-Capillary: 97 mg/dL (ref 70–99)

## 2012-09-06 MED ORDER — UNJURY VANILLA POWDER
2.0000 [oz_av] | Freq: Four times a day (QID) | ORAL | Status: DC
  Administered 2012-09-06 – 2012-09-08 (×5): 2 [oz_av] via ORAL

## 2012-09-06 MED ORDER — PROMETHAZINE HCL 25 MG RE SUPP
25.0000 mg | Freq: Once | RECTAL | Status: AC
  Administered 2012-09-06: 25 mg via RECTAL
  Filled 2012-09-06: qty 1

## 2012-09-06 NOTE — Progress Notes (Signed)
Spoke to Dr. Hassell Done informed him that patient given zofran at 81, and now patient retching but only clear sputum. MD states to give phenergan suppository. Informed MD patient b/p trends high but last 174/104 but patient in pain at this time, MD states is ok suppository should help.

## 2012-09-06 NOTE — Progress Notes (Signed)
2 Days Post-Op  Subjective: UGI negative.  No nausea or vomiting since UGI  Objective: Vital signs in last 24 hours: Temp:  [98.1 F (36.7 C)-98.4 F (36.9 C)] 98.1 F (36.7 C) (08/19 0626) Pulse Rate:  [64-65] 64 (08/19 0626) Resp:  [16] 16 (08/19 0626) BP: (136-176)/(83-91) 176/91 mmHg (08/19 0626) SpO2:  [98 %-100 %] 98 % (08/19 0626) Last BM Date: 09/05/12  Intake/Output from previous day: 08/18 0701 - 08/19 0700 In: 3422.5 [P.O.:360; I.V.:3062.5] Out: 1400 [Urine:1400] Intake/Output this shift:    General appearance: alert, cooperative and no distress Resp: clear to auscultation bilaterally Cardio: regular rate and rhythm, S1, S2 normal, no murmur, click, rub or gallop GI: soft, non-tender; bowel sounds normal; no masses,  no organomegaly  Lab Results:  No results found for this basename: WBC, HGB, HCT, PLT,  in the last 72 hours BMET  Recent Labs  09/04/12 0355 09/05/12 0355  NA 141 141  K 4.3 4.2  CL 111 113*  CO2 20 21  GLUCOSE 102* 149*  BUN 23 18  CREATININE 2.37* 2.25*  CALCIUM 8.9 8.3*   PT/INR No results found for this basename: LABPROT, INR,  in the last 72 hours ABG No results found for this basename: PHART, PCO2, PO2, HCO3,  in the last 72 hours  Studies/Results: Dg Ugi W/kub  09/05/2012   *RADIOLOGY REPORT*  Clinical Data:  Patient with vertical sleeve gastrectomy approximately 7 weeks ago.  Now with recurrent nausea/vomiting.  UPPER GI SERIES W/ KUB  Technique:  After obtaining a scout radiograph, a single column water-soluble evaluation of the esophagus and stomach was performed to evaluate for leak.  Subsequently this was followed with a double contrast upper GI study at the request of the ordering physician.  Fluoroscopy Time: 3 minutes 23 seconds.  Comparison: CT abdomen pelvis 09/02/2012  Findings: Limited evaluation with water-soluble contrast of the esophagus and stomach was performed.  This demonstrated no evidence for leak at the surgical  site.  Subsequently a double contrast upper GI evaluation of the esophagus and stomach was performed demonstrating grossly normal esophageal motility and expected postoperative appearance of the stomach status post vertical sleeve gastrectomy.  Contrast passed appropriately from the esophagus into the stomach and proximal duodenum.  No significant narrowing is identified.  The double contrast portion of the examination was limited as the patient vomited after administration of effervescent granules.  IMPRESSION: Expected postoperative appearance of the stomach status post vertical sleeve gastrectomy without evidence for leak.  No evidence for significant narrowing as the contrast column passed adequately from the esophagus to the stomach and proximal small bowel.   Original Report Authenticated By: Lovey Newcomer, M.D    Anti-infectives: Anti-infectives   None      Assessment/Plan: s/p Procedure(s) with comments: ESOPHAGOGASTRODUODENOSCOPY (EGD) (N/A) - PF continue to work with nutrition to find adequate diet for home.  no obvious cause of her nausea.  IV fluids and consider Dobhoff if not able to maintain nutrition on orals  LOS: 4 days    Madilyn Hook DAVID 09/06/2012

## 2012-09-06 NOTE — Progress Notes (Signed)
Nutrition Brief Note  Intervention: - Unjury protein shakes TID   Met with pt who denies any nausea/vomiting since last time we spoke yesterday. Pt states she had an episode of spitting up stuff in the early hours of this morning, but denies any vomiting. Pt ate 100% of 1 jello yesterday and a few bites of yogurt on the tray this morning. Ready to try Unjury shakes, will order.   Mikey College MS, [place], [place] Pager [phone] After Hours Pager

## 2012-09-07 ENCOUNTER — Ambulatory Visit (INDEPENDENT_AMBULATORY_CARE_PROVIDER_SITE_OTHER): Payer: BC Managed Care – PPO | Admitting: General Surgery

## 2012-09-07 LAB — GLUCOSE, CAPILLARY
Glucose-Capillary: 76 mg/dL (ref 70–99)
Glucose-Capillary: 67 mg/dL — ABNORMAL LOW (ref 70–99)
Glucose-Capillary: 70 mg/dL (ref 70–99)
Glucose-Capillary: 76 mg/dL (ref 70–99)
Glucose-Capillary: 76 mg/dL (ref 70–99)
Glucose-Capillary: 67 mg/dL — ABNORMAL LOW (ref 70–99)
Glucose-Capillary: 72 mg/dL (ref 70–99)

## 2012-09-07 MED ORDER — GLUCOSE-VITAMIN C 4-6 GM-MG PO CHEW: 4.0000 | CHEWABLE_TABLET | ORAL | Status: DC | PRN

## 2012-09-07 NOTE — Progress Notes (Signed)
Spoke to Dr. Lilyan Punt informed him patient had zofran 4mg  iv at 1645 and now dry heaving with clear sputum only coming up. Patient had a phenergan suppository yesterday but states it did not help. MD states to continue to monitor and no further orders given.

## 2012-09-07 NOTE — Progress Notes (Signed)
Hypoglycemic Event  CBG:67  Treatment: orange juice and graham crackers  Symptoms: none  Follow-up CBG: Time:2224 CBG Result:72  Possible Reasons for Event: Patient did eat her supper  Comments/MD notified:Dr Blanca Friend Cyndi Bender  Remember to initiate Hypoglycemia Order Set & complete

## 2012-09-07 NOTE — Progress Notes (Signed)
3 Days Post-Op  Subjective: 2 episodes of dry heaves yesterday.  She says that these are random and not associated with eating  Objective: Vital signs in last 24 hours: Temp:  [98.2 F (36.8 C)-98.9 F (37.2 C)] 98.9 F (37.2 C) (08/20 0530) Pulse Rate:  [62-89] 62 (08/20 0530) Resp:  [18] 18 (08/20 0530) BP: (137-174)/(66-104) 138/80 mmHg (08/20 0530) SpO2:  [100 %] 100 % (08/20 0530) Last BM Date: 09/06/12  Intake/Output from previous day: 08/19 0701 - 08/20 0700 In: 3591.7 [P.O.:600; I.V.:2991.7] Out: 500 [Urine:500] Intake/Output this shift:    General appearance: alert, cooperative and no distress Resp: clear to auscultation bilaterally Cardio: regular rate and rhythm, S1, S2 normal, no murmur, click, rub or gallop GI: soft, non-tender; bowel sounds normal; no masses,  no organomegaly  Lab Results:  No results found for this basename: WBC, HGB, HCT, PLT,  in the last 72 hours BMET  Recent Labs  09/05/12 0355  NA 141  K 4.2  CL 113*  CO2 21  GLUCOSE 149*  BUN 18  CREATININE 2.25*  CALCIUM 8.3*   PT/INR No results found for this basename: LABPROT, INR,  in the last 72 hours ABG No results found for this basename: PHART, PCO2, PO2, HCO3,  in the last 72 hours  Studies/Results: Dg Ugi W/kub  09/05/2012   *RADIOLOGY REPORT*  Clinical Data:  Patient with vertical sleeve gastrectomy approximately 7 weeks ago.  Now with recurrent nausea/vomiting.  UPPER GI SERIES W/ KUB  Technique:  After obtaining a scout radiograph, a single column water-soluble evaluation of the esophagus and stomach was performed to evaluate for leak.  Subsequently this was followed with a double contrast upper GI study at the request of the ordering physician.  Fluoroscopy Time: 3 minutes 23 seconds.  Comparison: CT abdomen pelvis 09/02/2012  Findings: Limited evaluation with water-soluble contrast of the esophagus and stomach was performed.  This demonstrated no evidence for leak at the surgical  site.  Subsequently a double contrast upper GI evaluation of the esophagus and stomach was performed demonstrating grossly normal esophageal motility and expected postoperative appearance of the stomach status post vertical sleeve gastrectomy.  Contrast passed appropriately from the esophagus into the stomach and proximal duodenum.  No significant narrowing is identified.  The double contrast portion of the examination was limited as the patient vomited after administration of effervescent granules.  IMPRESSION: Expected postoperative appearance of the stomach status post vertical sleeve gastrectomy without evidence for leak.  No evidence for significant narrowing as the contrast column passed adequately from the esophagus to the stomach and proximal small bowel.   Original Report Authenticated By: Lovey Newcomer, M.D    Anti-infectives: Anti-infectives   None      Assessment/Plan: s/p Procedure(s) with comments: ESOPHAGOGASTRODUODENOSCOPY (EGD) (N/A) - PF will try to advance diet.  not sure what is causing this. these are not associated with eating but she says that they are random.  will try regular diet today and see if she can tolerate diet enough for discharge  LOS: 5 days    [place], [place] 09/07/2012

## 2012-09-07 NOTE — Progress Notes (Signed)
Nutrition Brief Note  Pt kept down jello and yogurt yesterday. Tried vanilla and chicken soup Unjury but didn't like either. Pt reports throwing up some spit yesterday at 8pm, no food or liquid. Denies having any nausea. Was able to keep down eggs this morning. Seems to be in good spirits.   Mikey College MS, [place], [place] Pager [phone] After Hours Pager

## 2012-09-07 NOTE — Progress Notes (Signed)
Hypoglycemic Event  CBG: 67  Treatment: apple juice  Symptoms: none  Follow-up CBG: Time:0750 CBG Result:70  Possible Reasons for Event: patient nauseated over night and not eating  Comments/MD notified:Dr. Lilyan Punt notified on floor    Lauralee Evener  Remember to initiate Hypoglycemia Order Set & complete

## 2012-09-08 LAB — GLUCOSE, CAPILLARY
Glucose-Capillary: 68 mg/dL — ABNORMAL LOW (ref 70–99)
Glucose-Capillary: 68 mg/dL — ABNORMAL LOW (ref 70–99)
Glucose-Capillary: 70 mg/dL (ref 70–99)
Glucose-Capillary: 75 mg/dL (ref 70–99)
Glucose-Capillary: 75 mg/dL (ref 70–99)
Glucose-Capillary: 84 mg/dL (ref 70–99)
Glucose-Capillary: 86 mg/dL (ref 70–99)
Glucose-Capillary: 90 mg/dL (ref 70–99)

## 2012-09-08 MED ORDER — PROMETHAZINE HCL 25 MG RE SUPP: 25.0000 mg | Freq: Four times a day (QID) | RECTAL | Status: DC | PRN

## 2012-09-08 NOTE — Progress Notes (Signed)
4 Days Post-Op  Subjective: Keeping food and drink down but still having random episodes of wretching  Objective: Vital signs in last 24 hours: Temp:  [98 F (36.7 C)-99 F (37.2 C)] 98.9 F (37.2 C) (08/21 0519) Pulse Rate:  [60-77] 60 (08/21 0519) Resp:  [16] 16 (08/21 0519) BP: (153-179)/(75-97) 155/82 mmHg (08/21 0519) SpO2:  [97 %-100 %] 100 % (08/21 0519) Last BM Date: 09/07/12  Intake/Output from previous day: 08/20 0701 - 08/21 0700 In: 4067.5 [P.O.:1080; I.V.:2987.5] Out: -  Intake/Output this shift:    General appearance: alert, cooperative and no distress Resp: clear to auscultation bilaterally Cardio: regular rate and rhythm, S1, S2 normal, no murmur, click, rub or gallop GI: soft, non-tender; bowel sounds normal; no masses,  no organomegaly  Lab Results:  No results found for this basename: WBC, HGB, HCT, PLT,  in the last 72 hours BMET No results found for this basename: NA, K, CL, CO2, GLUCOSE, BUN, CREATININE, CALCIUM,  in the last 72 hours PT/INR No results found for this basename: LABPROT, INR,  in the last 72 hours ABG No results found for this basename: PHART, PCO2, PO2, HCO3,  in the last 72 hours  Studies/Results: No results found.  Anti-infectives: Anti-infectives   None      Assessment/Plan: s/p Procedure(s) with comments: ESOPHAGOGASTRODUODENOSCOPY (EGD) (N/A) - PF diet as tolerated.  she may continue to have this wretching but as long as she is able to keep food and drink down, then she may be able to go home.  will try phenergan suppositories prn  LOS: 6 days    Madilyn Hook DAVID 09/08/2012

## 2012-09-08 NOTE — Progress Notes (Signed)
Hypoglycemic Event  CBG:68  Treatment: Orange Juice and graham crackers  Symptoms: none  Follow-up CBG: [id number] CBG Result:90  Possible Reasons for Event: Patient did not eat her supper  Comments/MD notified:Dr Blanca Friend Cyndi Bender  Remember to initiate Hypoglycemia Order Set & complete

## 2012-09-09 ENCOUNTER — Other Ambulatory Visit (INDEPENDENT_AMBULATORY_CARE_PROVIDER_SITE_OTHER): Payer: Self-pay

## 2012-09-09 ENCOUNTER — Telehealth (INDEPENDENT_AMBULATORY_CARE_PROVIDER_SITE_OTHER): Payer: Self-pay

## 2012-09-09 DIAGNOSIS — E860 Dehydration: Secondary | ICD-10-CM

## 2012-09-09 LAB — GLUCOSE, CAPILLARY
Glucose-Capillary: 66 mg/dL — ABNORMAL LOW (ref 70–99)
Glucose-Capillary: 70 mg/dL (ref 70–99)
Glucose-Capillary: 70 mg/dL (ref 70–99)
Glucose-Capillary: 76 mg/dL (ref 70–99)

## 2012-09-09 MED ORDER — PROMETHAZINE HCL 25 MG RE SUPP: 25.0000 mg | Freq: Four times a day (QID) | RECTAL | Status: DC | PRN

## 2012-09-09 MED ORDER — ONDANSETRON 4 MG PO TBDP: 4.0000 mg | ORAL_TABLET | Freq: Three times a day (TID) | ORAL | Status: DC | PRN

## 2012-09-09 NOTE — Telephone Encounter (Signed)
Follow up call to patient.  Patient reports that she continues to have nausea and no appetite.  Patient states that she did eat some jello and did well with that.  Dr. Lilyan Punt would like to try placing a PICC Line in patient for IV fluids to hydrate patient.  Patient has been scheduled for PICC Line Placement on Monday 09/12/12 @ [facility].  Home Health has also been ordered to adminster IV NS Fluids via PICC Line Monday, Wednesday and Friday's.  Home Health Referral faxed to [phone].  Patient is to call our office Monday if she has not heard from [facility] by mid afternoon to come to our office @ [facility] for IV Normal Saline Fluids to be administered by one of our RN's.  Patient advised if symptoms worsen to contact our office to speak to an Diplomatic Services operational officer or report to the nearest ED for further assessment.  Patient verbalized understanding and is agreeable to the plan above.

## 2012-09-09 NOTE — Progress Notes (Signed)
Spoke with patient about diet and the importance of taking small bites and chewing thoroughly.  Also provided handout on soft protein choices and stressed to go very slow when adding new foods.  Confirmed appointment with Outpatient Bariatric RD, and encouraged patient to contact this Bariatric Nurse Coordinator or Outpatient Bariatric RD for any questions regarding diet.  Encouraged continuing protein shakes to supplement protein requirements not met through food sources, and reeducated requirements.  60 grams of protein per day, vitamin and mineral requirements, as well as fluid goals.  Will continue to monitor.     PHASE III A- HIGH PROTEIN  10-14 DAYS AFTER DISCHARGE UNTIL 2 MONTHS POST OP     1. Protein   Protein goal: 60 grams for women / 80 grams for men  Slowly begin replacing protein shakes with soft moist proteins (as tolerated)  Foods should be soft, diced, ground, or pureed  Chew food thoroughly  Take small bites (about the size of a dime)  Eat from small plates and use small utensils to help control portions   Eat at least 3 meals per day   - 2-3 ounces or one protein drink  Add snacks as needed   - Listen to your body  - Eat every 3-5 hours  - Consume 1-2 ounces  *Avoid all STARCHES (bread, cereal, rice, pasta, potatoes, corn, peas, sweets), FRUIT, & VEGETABLES.  *Your body is not ready to digest these foods.  2. Fluids   Fluid goal: >64 ounces per day  Increase the amounts of clear/full liquids as tolerated  No fluids 15 minutes before, during, and 30 minutes after meals  All fluids should be caffeine-free, non-carbonated, and sugar-free   3. Vitamin/Calcium/B12 Supplementation  Lap Band Gastric Bypass/Sleeve  1 multivitamin/day 2 multivitamins/day  1500 mg calcium citrate 1500 mg calcium citrate  350-500 mcg sublingual B12  4. Daily Exercise   Gradually increase physical activity levels as you are able, per your doctors recommendations  Try to be as active as  you can!      [facility] [address], [place], [place] [address]  [phone]   Type of Protein Grams Protein in 2 oz  Cheese (low-fat preferred) 14  Cottage Cheese 14  Eggs, 2 (consume <5 yolks/week) 12  Egg Beaters 7  Fish 14  Shellfish 14  Chicken 14  [place] 14  Deli Meat (low-fat preferred) 14  Lean Pork 14  Lean Beef (ground; >90% lean) 14  Skim Milk 3  Soy Milk (unflavored) 3  Greek Yogurt (<12g carbohydrate or less) 10  Yogurt (<12g carbohydrate or less) 3-5  Beans or Lentils (dried or canned beans) (1/2 cup) 7-8  Green Beans, Summer Squash, Zucchini (1/2 cup) 2  * All proteins must be BAKED, BROILED, GRILLED, STEAMED, OR SAUTEED.  * No breaded or fried proteins.  * Do not add flour based gravy to protein.  * Do not add any pieces or chunks of any fruit/vegetable to foods   Helpful Hints  FOOD LABELS Educate yourself on label reading. Many food manufacturers will add "hidden" sources of carbohydrate and sugar. Do not believe health claims. Many products advertise "Low-Carb" when they actually aren't. Keep carbohydrates <12g/serving.  BEWARE Be aware of sugar alcohols in your food/beverages. Mannitol, Sorbitol, Xylitol, etc. can cause gas pain and diarrhea because we do not absorb them well.  ADD MOISTURE Dry meat/protein will not be well tolerated. Cook food until soft and tender. You  may need to add water, broth, or low-fat cream soups to foods while cooking. You may also use low-fat salad dressings, low-fat mayonnaise, or No Added Sugar BBQ sauce or ketchup. Beware of other sauces (i.e. teriyaki, Polynesian, honey mustard, sweet & sour, etc.) as they contain large amounts of sugar.

## 2012-09-09 NOTE — Care Management Note (Signed)
    Page 1 of 1   09/09/2012     10:24:43 AM   CARE MANAGEMENT NOTE 09/09/2012  Patient:  Brenda Davis, Brenda Davis   Account Number:  [id number]  Date Initiated:  09/03/2012  Documentation initiated by:  [facility]  Subjective/Objective Assessment:   47 year old female admitted with abdominal pain, nausea and vomiting.     Action/Plan:   From home.   Anticipated DC Date:  09/10/2012   Anticipated DC Plan:  [place]  CM consult      Choice offered to / List presented to:             Status of service:  Completed, signed off Medicare Important Message given?  NA - LOS <3 / Initial given by admissions (If response is "NO", the following Medicare IM given date fields will be blank) Date Medicare IM given:   Date Additional Medicare IM given:    Discharge Disposition:  HOME/SELF CARE  Per UR Regulation:  Reviewed for med. necessity/level of care/duration of stay  If discussed at [place] of Stay Meetings, dates discussed:   09/08/2012    Comments:

## 2012-09-09 NOTE — Discharge Summary (Signed)
  Physician Discharge Summary  Patient ID: Brenda Davis MRN: [id number] DOB/AGE: 1965/04/10 47 y.o.  Admit date: 09/02/2012 Discharge date: 09/09/2012  Admission Diagnoses:nausea and vomiting  Discharge Diagnoses: same Active Problems:   * No active hospital problems. *   Discharged Condition: stable  Hospital Course: admitted for persistent nausea and vomiting.  Fluid resussitation performed.  Workup done with CT, UGI, and EGD which were all negative for potential causes of the nausea.  She was able to tolerate the diet although she was still having episodes of nausea and vomiting.  She was stable for discharge   Consults: None  Significant Diagnostic Studies: radiology: CT, UGI, EGD  Treatments: IV hydration  Disposition: 01-Home or Self Care   Future Appointments Provider Department Dept Phone   09/13/2012 8:30 AM Bethann Goo [place], RD [facility] [phone]       Medication List         hydrALAZINE 50 MG tablet  Commonly known as:  APRESOLINE  Take 1 tablet (50 mg total) by mouth every 8 (eight) hours.     magnesium hydroxide 400 MG/5ML suspension  Commonly known as:  MILK OF MAGNESIA  Take 30 mLs by mouth daily as needed for constipation.     nebivolol 10 MG tablet  Commonly known as:  BYSTOLIC  Take 10 mg by mouth every evening.     ondansetron 4 MG disintegrating tablet  Commonly known as:  ZOFRAN ODT  Take 1 tablet (4 mg total) by mouth every 8 (eight) hours as needed for nausea.     ondansetron 4 MG disintegrating tablet  Commonly known as:  ZOFRAN ODT  Take 1 tablet (4 mg total) by mouth every 8 (eight) hours as needed for nausea.     pantoprazole 40 MG tablet  Commonly known as:  PROTONIX  Take 1 tablet (40 mg total) by mouth daily.     promethazine 25 MG suppository  Commonly known as:  PHENERGAN  Place 1 suppository (25 mg total) rectally every 6 (six) hours as needed for nausea.     rosuvastatin 20 MG tablet  Commonly known as:  CRESTOR  Take 20 mg by mouth every morning.         SignedMadilyn Hook DAVID 09/09/2012, 7:28 AM

## 2012-09-09 NOTE — Progress Notes (Signed)
5 Days Post-Op  Subjective: She is able to eat.  She is still having random episodes of nausea.  +BM  Objective: Vital signs in last 24 hours: Temp:  [98.1 F (36.7 C)-98.4 F (36.9 C)] 98.4 F (36.9 C) (08/22 [id number]) Pulse Rate:  [59-67] 67 (08/22 0613) Resp:  [16] 16 (08/22 0613) BP: (155-168)/(84-96) 168/84 mmHg (08/22 0613) SpO2:  [100 %] 100 % (08/22 0613) Last BM Date: 09/07/12  Intake/Output from previous day: 08/21 0701 - 08/22 0700 In: 4100.8 [P.O.:1080; I.V.:3020.8] Out: -  Intake/Output this shift:    General appearance: alert, cooperative and no distress Resp: clear to auscultation bilaterally Cardio: regular rate and rhythm, S1, S2 normal, no murmur, click, rub or gallop GI: soft, non-tender; bowel sounds normal; no masses,  no organomegaly  Lab Results:  No results found for this basename: WBC, HGB, HCT, PLT,  in the last 72 hours BMET No results found for this basename: NA, K, CL, CO2, GLUCOSE, BUN, CREATININE, CALCIUM,  in the last 72 hours PT/INR No results found for this basename: LABPROT, INR,  in the last 72 hours ABG No results found for this basename: PHART, PCO2, PO2, HCO3,  in the last 72 hours  Studies/Results: No results found.  Anti-infectives: Anti-infectives   None      Assessment/Plan: s/p Procedure(s) with comments: ESOPHAGOGASTRODUODENOSCOPY (EGD) (N/A) - PF she is able to eat although she still has random episodes of the nausea and wretching.  we again discussed the possibility of a dobhoff tube but she is tolerating diet and should be able to keep hydrated without this.  We are going to try to discharge her with the phenergan and zofran and since she is able to get nutrition, we will hold on the tube.  She will call me next week and if unable to meet her nutritional needs, then we will arrange for outpatient dobhoff placement.  LOS: 7 days    Madilyn Hook DAVID 09/09/2012

## 2012-09-11 ENCOUNTER — Other Ambulatory Visit (INDEPENDENT_AMBULATORY_CARE_PROVIDER_SITE_OTHER): Payer: Self-pay | Admitting: Surgery

## 2012-09-12 ENCOUNTER — Ambulatory Visit (HOSPITAL_COMMUNITY)
Admission: RE | Admit: 2012-09-12 | Discharge: 2012-09-12 | Disposition: A | Discharge status: Home / Self Care | Payer: BC Managed Care – PPO | Source: Ambulatory Visit | Attending: General Surgery | Admitting: General Surgery

## 2012-09-12 ENCOUNTER — Other Ambulatory Visit (INDEPENDENT_AMBULATORY_CARE_PROVIDER_SITE_OTHER): Payer: Self-pay | Admitting: General Surgery

## 2012-09-12 ENCOUNTER — Other Ambulatory Visit (INDEPENDENT_AMBULATORY_CARE_PROVIDER_SITE_OTHER): Payer: Self-pay

## 2012-09-12 DIAGNOSIS — E860 Dehydration: Secondary | ICD-10-CM | POA: Insufficient documentation

## 2012-09-12 NOTE — Procedures (Signed)
US/fluoroscopic guided right SL basilic vein PICC placed. Length 43 cm. Tip SVC/RA junction. Ok to use. No immediate complications.

## 2012-09-13 ENCOUNTER — Encounter (INDEPENDENT_AMBULATORY_CARE_PROVIDER_SITE_OTHER): Payer: Self-pay

## 2012-09-13 ENCOUNTER — Other Ambulatory Visit (INDEPENDENT_AMBULATORY_CARE_PROVIDER_SITE_OTHER): Payer: Self-pay | Admitting: General Surgery

## 2012-09-13 ENCOUNTER — Ambulatory Visit: Payer: BC Managed Care – PPO | Admitting: *Deleted

## 2012-09-13 DIAGNOSIS — R112 Nausea with vomiting, unspecified: Secondary | ICD-10-CM

## 2012-09-13 MED ORDER — SCOPOLAMINE 1 MG/3DAYS TD PT72
1.0000 | MEDICATED_PATCH | TRANSDERMAL | Status: DC
Start: 2012-09-13 — End: 2012-09-27

## 2012-09-13 MED ORDER — METOCLOPRAMIDE HCL 5 MG/5ML PO SOLN: 10.0000 mg | Freq: Three times a day (TID) | ORAL | Status: DC

## 2012-09-13 NOTE — Progress Notes (Signed)
Patient called into office to report that she's still feeling weak, having nausea and vomiting.  Patient denies having any fevers or abdominal pain.  Patient currently has [place] in place for infusion of NS.  Verbal order for unit of multi-vitamin (Banana Bag) to NS plus 100mg  Thiamine.  Called and spoke to [place] @ [facility] verbal order given per Dr. Lilyan Punt.  Spoke to patient to make her aware that Dr. Lilyan Punt has ordered multi vitamin and thiamine.

## 2012-09-13 NOTE — Progress Notes (Signed)
I called her to see how she was doing. She is similar to hospital discharge.  She is keeping food and liquids down but having frequent and intermittent wretching.  She is not vomiting the food but a "phlegm".  We will try reglan and scopolamine patch as well.  This may be diabetic gastroparesis and may respond to reglan.

## 2012-09-15 ENCOUNTER — Telehealth (INDEPENDENT_AMBULATORY_CARE_PROVIDER_SITE_OTHER): Payer: Self-pay | Admitting: General Surgery

## 2012-09-15 NOTE — Telephone Encounter (Signed)
Called to see if there was anything that she could take for "stomach cramps".  She was wondering if she could try pepto bismol.  She has been keeping down yogurt and eggs and it sounds like she is having longer time between spells of nausea.  I think that it would be okay to try the OTC remedies such as pepto if it helps.  She will keep me posted on her progress.

## 2012-09-15 NOTE — Telephone Encounter (Signed)
Pt called to report she is having transient, severe stomach cramping.  Asking for medication for this.  Informed Dr. Lilyan Punt will be here in the office soon and will make him aware of all.

## 2012-09-20 ENCOUNTER — Telehealth (INDEPENDENT_AMBULATORY_CARE_PROVIDER_SITE_OTHER): Payer: Self-pay

## 2012-09-20 ENCOUNTER — Encounter (INDEPENDENT_AMBULATORY_CARE_PROVIDER_SITE_OTHER): Payer: Self-pay

## 2012-09-20 NOTE — Telephone Encounter (Signed)
Patient employer is requesting for out of work note her FMLA has expired  please fax note to [place] attention [place] # [phone]

## 2012-09-20 NOTE — Telephone Encounter (Signed)
Patient is stating her employer is asking for documentation of her reason she continues to be out of work. Advised patient for her employer to send request to our office , I gave her the fax number for the request to be sent

## 2012-09-22 ENCOUNTER — Telehealth (INDEPENDENT_AMBULATORY_CARE_PROVIDER_SITE_OTHER): Payer: Self-pay | Admitting: General Surgery

## 2012-09-22 ENCOUNTER — Ambulatory Visit: Payer: BC Managed Care – PPO | Admitting: Dietician

## 2012-09-22 ENCOUNTER — Other Ambulatory Visit (INDEPENDENT_AMBULATORY_CARE_PROVIDER_SITE_OTHER): Payer: Self-pay

## 2012-09-22 DIAGNOSIS — R112 Nausea with vomiting, unspecified: Secondary | ICD-10-CM

## 2012-09-22 NOTE — Telephone Encounter (Signed)
When I spoke with her last week, she said that she was doing better and only vomiting 1/day but she has been worse this week.  She is still getting fluids and liquids and yogurt are going down okay but having problems with other items.  It sounds as though she has a stricture despite her last endoscopy looking okay.  I would like to look at this personally and plan for dilation if any narrowing is seen.  We will set her up with EGD and dilation.

## 2012-09-23 ENCOUNTER — Telehealth: Payer: Self-pay | Admitting: Gastroenterology

## 2012-09-23 NOTE — Telephone Encounter (Signed)
Spoke to [employer]. Ok to put pt in for Mon 09-26-12 10am. Left Message for [facility] @ [facility] . I also called patient with appointment date & time. She is already a pt of Dr Ronnald Ramp in Primary Care 1st flr so she knows where we are located.

## 2012-09-26 ENCOUNTER — Telehealth (INDEPENDENT_AMBULATORY_CARE_PROVIDER_SITE_OTHER): Payer: Self-pay

## 2012-09-26 ENCOUNTER — Telehealth (INDEPENDENT_AMBULATORY_CARE_PROVIDER_SITE_OTHER): Payer: Self-pay | Admitting: General Surgery

## 2012-09-26 ENCOUNTER — Other Ambulatory Visit: Payer: Self-pay

## 2012-09-26 ENCOUNTER — Ambulatory Visit (INDEPENDENT_AMBULATORY_CARE_PROVIDER_SITE_OTHER): Payer: BC Managed Care – PPO | Admitting: Gastroenterology

## 2012-09-26 ENCOUNTER — Telehealth: Payer: Self-pay | Admitting: Gastroenterology

## 2012-09-26 ENCOUNTER — Encounter: Payer: Self-pay | Admitting: Gastroenterology

## 2012-09-26 ENCOUNTER — Telehealth (INDEPENDENT_AMBULATORY_CARE_PROVIDER_SITE_OTHER): Payer: Self-pay | Admitting: *Deleted

## 2012-09-26 VITALS — BP 130/88 | HR 70 | Ht 64.0 in | Wt 248.0 lb

## 2012-09-26 DIAGNOSIS — R112 Nausea with vomiting, unspecified: Secondary | ICD-10-CM

## 2012-09-26 NOTE — Progress Notes (Signed)
Pt to be instructed by Christianne Dolin she is calling the pt to let her know.

## 2012-09-26 NOTE — Telephone Encounter (Signed)
She saw GI today for consultation.  She says that she is still vomiting 2-3/day. She is still keeping down water and is staying hydrated.  She is able to take down yogurt most times and protein shakes often.  I spoke with Alonza Bogus at [facility] and she is going to speak with Dr. Deatra Ina to see if we can set up EGD with possible dilation for tomorrow.

## 2012-09-26 NOTE — Telephone Encounter (Signed)
Called and spoke to patient with lab results, B1 results are still pending.  Patient advised that her lab results reveal that we a re moving in the right direction.  Patient also made aware that she's scheduled for EGD at [facility] @ 7:45 am w/Dr. Ardis Hughs & Dr. Lilyan Punt.  Patient verbalized understanding and agrees with plan as above.  I will call patient when B1 results are available.

## 2012-09-26 NOTE — Telephone Encounter (Signed)
Gina with [facility] called to state that she is unable to flush her PICC.  As Barnett Applebaum began asking patient more questions she was able to find out that patient has not flushed her PICC since Saturday.  Barnett Applebaum assessed that there is a high risk that PICC has clotted off.  Barnett Applebaum advised patient to go to ED to have PICC assessed.  She was calling to let us know of what she had advised patient to do.

## 2012-09-26 NOTE — Telephone Encounter (Signed)
[facility]  states she could not draw B1 it needs to be frozen

## 2012-09-26 NOTE — Telephone Encounter (Signed)
Patient mother is stating patient has to be seen today, Dr Milagros Reap office stated they did not know what she was be seen for so the patient went home, patient is having nausea and vomiting and she is not able to keep water down. I spoke with patient and clarified the same. Paged Dr. Lilyan Punt he advised for me to speak Alyse Low and have her to call him about the patient

## 2012-09-26 NOTE — Patient Instructions (Addendum)
We will contact Dr Lilyan Punt and discuss treatment options and will call you with his recommendations. CC:  Scarlette Calico MD

## 2012-09-26 NOTE — Telephone Encounter (Signed)
Spoke with Dr. Lilyan Punt.  He would like one of our MD's to perform EGD with dil, and he would like to be there for the procedure.  I spoke with patient and gave her date of Thursday 9/11 at 7:45 am at [facility].  She was told to report to [facility] at 6:30 am with nothing to eat or drink after midnight.  Patient understood and repeated time back to me.

## 2012-09-26 NOTE — Progress Notes (Signed)
09/26/2012  Brenda Davis  [id number]  07/03/1965  HISTORY OF PRESENT ILLNESS: Patient is a 47-year-old female who was referred to our practice by Dr. Layton for her persistent nausea and vomiting. She underwent sleeve gastrectomy on July 1 without any immediate complications or postsurgical issues. However, since the end of July she has had persistent nausea and vomiting. She says that yogurt and water are usually the only things that she can keep down. She has nausea and vomiting even when she doesn't eat. She has been readmitted to the hospital on 2 occasions with CT scans being unremarkable both times. She's had one repeat endoscopy by Dr. Newman, which was unremarkable as well. She has tried Reglan, Zofran, protonix, Phenergan, and scopolamine patch without much improvement in her symptoms. According to Dr. Layton's last note, he was going to schedule her for another endoscopy. To her knowledge this was supposed to be the next step in evaluation as well. She reports that there were no issues similar to this prior to her surgery. Says that she does not have many issues with vomiting at night (there has only been a couple of occasions of when she woke up and vomited at night).  Past Medical History   Diagnosis  Date   .  Hypertension    .  Diabetes mellitus    .  Gout    .  Depression    .  Gout    .  Hyperlipidemia    .  Blood transfusion without reported diagnosis    .  Morbid obesity    .  Chronic kidney disease      STAGE 3 CHRONIC KIDNEY DISEASE SECONDARY TO DIABETIC GLOMERULOSCLEROSIS AND UNCONTROLLED HYPERTENSION - PER OFFICE NOTES DR. POWELL -[facility].   .  Headache(784.0)      CHRONIC H/A'S   .  Pain      LEFT SHOULDER PAIN - WAS SEEN AT AN URGENT CARE - GIVEN SLING FOR COMFORT AND TOLD ROM AS TOLERATED.    Past Surgical History   Procedure  Laterality  Date   .  Cesarean section   1988, 1993   .  Carpal tunnel release   2008   .  Laparoscopic gastric sleeve resection   N/A  07/19/2012     Procedure: LAPAROSCOPIC SLEEVE GASTRECTOMY with EGD; Surgeon: Brian Layton, DO; Location: [facility]; Service: General; Laterality: N/A; laparoscopic sleeve gastrectomy with EGD   .  Upper gi endoscopy  N/A  07/19/2012     Procedure: UPPER GI ENDOSCOPY; Surgeon: Brian Layton, DO; Location: [facility]; Service: General; Laterality: N/A;   .  Esophagogastroduodenoscopy  N/A  09/04/2012     Procedure: ESOPHAGOGASTRODUODENOSCOPY (EGD); Surgeon: David H Newman, MD; Location: [facility]; Service: General; Laterality: N/A; PF   reports that she has quit smoking. Her smoking use included Cigarettes. She smoked 0.00 packs per day for 20 years. She has never used smokeless tobacco. She reports that she does not drink alcohol or use illicit drugs.  family history includes Arthritis in her other; Diabetes in her father; Hyperlipidemia in her other; Hypertension in her mother. There is no history of Cancer, Heart disease, Kidney disease, or Stroke.  Allergies   Allergen  Reactions   .  Amlodipine      edema   .  Clonidine Derivatives  Swelling   .  Welchol [Colesevelam Hcl]  Nausea Only    Outpatient Encounter Prescriptions as of 09/26/2012   Medication  Sig  Dispense  Refill   .    hydrALAZINE (APRESOLINE) 50 MG tablet  Take 1 tablet (50 mg total) by mouth every 8 (eight) hours.  90 tablet  0   .  nebivolol (BYSTOLIC) 10 MG tablet  Take 10 mg by mouth every evening.     .  magnesium hydroxide (MILK OF MAGNESIA) 400 MG/5ML suspension  Take 30 mLs by mouth daily as needed for constipation.     .  metoCLOPramide (REGLAN) 5 MG/5ML solution  Take 10 mLs (10 mg total) by mouth 3 (three) times daily before meals.  240 mL  1   .  ondansetron (ZOFRAN ODT) 4 MG disintegrating tablet  Take 1 tablet (4 mg total) by mouth every 8 (eight) hours as needed for nausea.  20 tablet  0   .  ondansetron (ZOFRAN ODT) 4 MG disintegrating tablet  Take 1 tablet (4 mg total) by mouth every 8 (eight) hours as needed for  nausea.  30 tablet  0   .  pantoprazole (PROTONIX) 40 MG tablet  Take 1 tablet (40 mg total) by mouth daily.  90 tablet  1   .  promethazine (PHENERGAN) 25 MG suppository  Place 1 suppository (25 mg total) rectally every 6 (six) hours as needed for nausea.  30 each  0   .  rosuvastatin (CRESTOR) 20 MG tablet  Take 20 mg by mouth every morning.     .  scopolamine (TRANSDERM-SCOP) 1.5 MG  Place 1 patch (1.5 mg total) onto the skin every 3 (three) days.  10 patch  2    No facility-administered encounter medications on file as of 09/26/2012.   REVIEW OF SYSTEMS : All other systems reviewed and negative except where noted in the History of Present Illness.  PHYSICAL EXAM:  BP 130/88  Pulse 70  Ht 5' 4" (1.626 m)  Wt 248 lb (112.492 kg)  BMI 42.55 kg/m2  General: Well developed black female in no acute distress  Head: Normocephalic and atraumatic  Eyes: sclerae anicteric, conjunctive pink.  Ears: Normal auditory acuity  Lungs: Clear throughout to auscultation  Heart: Regular rate and rhythm  Abdomen: Soft, non-distended. BS present. Non-tender.  Musculoskeletal: Symmetrical with no gross deformities  Skin: No lesions on visible extremities  Extremities: No edema  Neurological: Alert oriented x 4, grossly nonfocal  Psychological: Alert and cooperative. Normal mood and affect  ASSESSMENT AND PLAN:  -Nausea and vomiting post gastric sleeve: I am going to contact Dr. Layton to see if I can get an idea of what kind of services that he had in mind for us to provide to evaluate/help her symptoms. I offered the patient a GES but she declined for now stating that she would like to see what Dr. Layton says, plus she did not think that she could eat the required egg, etc without vomiting.  

## 2012-09-27 ENCOUNTER — Encounter (HOSPITAL_COMMUNITY): Payer: Self-pay | Admitting: Emergency Medicine

## 2012-09-27 ENCOUNTER — Emergency Department (HOSPITAL_COMMUNITY)
Admission: EM | Admit: 2012-09-27 | Discharge: 2012-09-27 | Disposition: A | Discharge status: Home / Self Care | Payer: BC Managed Care – PPO | Attending: Emergency Medicine | Admitting: Emergency Medicine

## 2012-09-27 DIAGNOSIS — R42 Dizziness and giddiness: Secondary | ICD-10-CM | POA: Insufficient documentation

## 2012-09-27 DIAGNOSIS — Z9884 Bariatric surgery status: Secondary | ICD-10-CM | POA: Insufficient documentation

## 2012-09-27 DIAGNOSIS — E119 Type 2 diabetes mellitus without complications: Secondary | ICD-10-CM | POA: Insufficient documentation

## 2012-09-27 DIAGNOSIS — E785 Hyperlipidemia, unspecified: Secondary | ICD-10-CM | POA: Insufficient documentation

## 2012-09-27 DIAGNOSIS — R197 Diarrhea, unspecified: Secondary | ICD-10-CM | POA: Insufficient documentation

## 2012-09-27 DIAGNOSIS — Z8659 Personal history of other mental and behavioral disorders: Secondary | ICD-10-CM | POA: Insufficient documentation

## 2012-09-27 DIAGNOSIS — Y831 Surgical operation with implant of artificial internal device as the cause of abnormal reaction of the patient, or of later complication, without mention of misadventure at the time of the procedure: Secondary | ICD-10-CM | POA: Insufficient documentation

## 2012-09-27 DIAGNOSIS — Z79899 Other long term (current) drug therapy: Secondary | ICD-10-CM | POA: Insufficient documentation

## 2012-09-27 DIAGNOSIS — Z87891 Personal history of nicotine dependence: Secondary | ICD-10-CM | POA: Insufficient documentation

## 2012-09-27 DIAGNOSIS — I129 Hypertensive chronic kidney disease with stage 1 through stage 4 chronic kidney disease, or unspecified chronic kidney disease: Secondary | ICD-10-CM | POA: Insufficient documentation

## 2012-09-27 DIAGNOSIS — T82898A Other specified complication of vascular prosthetic devices, implants and grafts, initial encounter: Secondary | ICD-10-CM | POA: Insufficient documentation

## 2012-09-27 DIAGNOSIS — N183 Chronic kidney disease, stage 3 unspecified: Secondary | ICD-10-CM | POA: Insufficient documentation

## 2012-09-27 DIAGNOSIS — R112 Nausea with vomiting, unspecified: Secondary | ICD-10-CM | POA: Insufficient documentation

## 2012-09-27 DIAGNOSIS — R109 Unspecified abdominal pain: Secondary | ICD-10-CM | POA: Insufficient documentation

## 2012-09-27 DIAGNOSIS — E860 Dehydration: Secondary | ICD-10-CM

## 2012-09-27 LAB — CBC WITH DIFFERENTIAL/PLATELET
Basophils Absolute: 0 10*3/uL (ref 0.0–0.1)
Basophils Relative: 0 % (ref 0–1)
Eosinophils Absolute: 0.8 10*3/uL — ABNORMAL HIGH (ref 0.0–0.7)
Eosinophils Relative: 16 % — ABNORMAL HIGH (ref 0–5)
HCT: 30.3 % — ABNORMAL LOW (ref 36.0–46.0)
Hemoglobin: 10 g/dL — ABNORMAL LOW (ref 12.0–15.0)
Lymphocytes Relative: 23 % (ref 12–46)
Lymphs Abs: 1.1 10*3/uL (ref 0.7–4.0)
MCH: 27 pg (ref 26.0–34.0)
MCHC: 33 g/dL (ref 30.0–36.0)
MCV: 81.9 fL (ref 78.0–100.0)
Monocytes Absolute: 0.7 10*3/uL (ref 0.1–1.0)
Monocytes Relative: 15 % — ABNORMAL HIGH (ref 3–12)
Neutro Abs: 2.3 10*3/uL (ref 1.7–7.7)
Neutrophils Relative %: 47 % (ref 43–77)
Platelets: 280 10*3/uL (ref 150–400)
RBC: 3.7 MIL/uL — ABNORMAL LOW (ref 3.87–5.11)
RDW: 16 % — ABNORMAL HIGH (ref 11.5–15.5)
WBC: 4.9 10*3/uL (ref 4.0–10.5)

## 2012-09-27 LAB — COMPREHENSIVE METABOLIC PANEL
ALT: 15 U/L (ref 0–35)
AST: 21 U/L (ref 0–37)
Albumin: 2.4 g/dL — ABNORMAL LOW (ref 3.5–5.2)
Alkaline Phosphatase: 129 U/L — ABNORMAL HIGH (ref 39–117)
BUN: 17 mg/dL (ref 6–23)
CO2: 22 mEq/L (ref 19–32)
Calcium: 8.9 mg/dL (ref 8.4–10.5)
Chloride: 104 mEq/L (ref 96–112)
Creatinine, Ser: 2.08 mg/dL — ABNORMAL HIGH (ref 0.50–1.10)
GFR calc Af Amer: 32 mL/min — ABNORMAL LOW (ref >90)
GFR calc non Af Amer: 27 mL/min — ABNORMAL LOW (ref >90)
Glucose, Bld: 81 mg/dL (ref 70–99)
Potassium: 3.4 mEq/L — ABNORMAL LOW (ref 3.5–5.1)
Sodium: 138 mEq/L (ref 135–145)
Total Bilirubin: 0.4 mg/dL (ref 0.3–1.2)
Total Protein: 6.2 g/dL (ref 6.0–8.3)

## 2012-09-27 LAB — URINE MICROSCOPIC-ADD ON

## 2012-09-27 LAB — URINALYSIS, ROUTINE W REFLEX MICROSCOPIC
Glucose, UA: 100 mg/dL — AB
Ketones, ur: 40 mg/dL — AB
Leukocytes, UA: NEGATIVE
Nitrite: NEGATIVE
Protein, ur: >300 mg/dL — AB
Specific Gravity, Urine: 1.025 (ref 1.005–1.030)
Urobilinogen, UA: 1 mg/dL (ref 0.0–1.0)
pH: 6 (ref 5.0–8.0)

## 2012-09-27 MED ORDER — DIPHENHYDRAMINE HCL 50 MG/ML IJ SOLN
25.0000 mg | Freq: Once | INTRAMUSCULAR | Status: AC
  Administered 2012-09-27: 25 mg via INTRAMUSCULAR
  Filled 2012-09-27: qty 1

## 2012-09-27 MED ORDER — METOCLOPRAMIDE HCL 5 MG/ML IJ SOLN
10.0000 mg | Freq: Once | INTRAMUSCULAR | Status: AC
  Administered 2012-09-27: 10 mg via INTRAMUSCULAR
  Filled 2012-09-27: qty 2

## 2012-09-27 MED ORDER — SODIUM CHLORIDE 0.9 % IV SOLN
1000.0000 mL | Freq: Once | INTRAVENOUS | Status: AC
  Administered 2012-09-27: 1000 mL via INTRAVENOUS

## 2012-09-27 MED ORDER — PROMETHAZINE HCL 25 MG RE SUPP: 25.0000 mg | Freq: Four times a day (QID) | RECTAL | Status: DC | PRN

## 2012-09-27 MED ORDER — ALTEPLASE 2 MG IJ SOLR
2.0000 mg | Freq: Once | INTRAMUSCULAR | Status: AC
  Administered 2012-09-27: 2 mg
  Filled 2012-09-27: qty 2

## 2012-09-27 NOTE — Progress Notes (Signed)
Would consider gastric emptying scan to see if patient has gastroparesis.  Outlet obstruction is also a consideration although not evident by prior EGD.  Per Dr. Lilyan Punt will schedule repeat EGD

## 2012-09-27 NOTE — ED Notes (Signed)
Per Dr Tomi Bamberger we will wait for IV team to flush the PICC line and draw labs from PICC;patinet made aware we need urine sample, sts will try later.

## 2012-09-27 NOTE — ED Notes (Signed)
IV team paged.  

## 2012-09-27 NOTE — ED Notes (Signed)
Pt states that she has a picc to "prevent her from becoming dehydrated".  States that last night when they were trying to access the picc, it wouldn't flush.  Has been vomiting since August 1st without a diagnosis.  Actively vomiting now.

## 2012-09-27 NOTE — ED Notes (Signed)
Right brachial PICC line- no redness or swelling noted, unable to flush.

## 2012-09-27 NOTE — ED Provider Notes (Signed)
CSN: [id number]     Arrival date & time 09/27/12  0803 History   First MD Initiated Contact with Patient 09/27/12 [phone]     Chief Complaint  Patient presents with  . Vascular Access Problem  . Nausea  . Emesis   (Consider location/radiation/quality/duration/timing/severity/associated sxs/prior Treatment) HPI Patient reports she had a gastric sleeve placed by Dr. Lilyan Punt on July 1. She reports around July 29 she started having nausea and vomiting that has been intractable. She was admitted on August 1 for dehydration. She had a CT scan which showed the sleeve was in good position. She was discharged on the fourth. She reports when she got home she got worse. She was admitted again on August 15 and discharged on the 22nd. She had another CT scan which again did not show any acute problem. She reports she still is unable to eat. She has nausea with vomiting 3-4 times a day. She states she's having diarrhea 4-5 times a day. She states she has been taking Pepto-Bismol and her stools have turned from yellow to black. She states she feels weak, dizzy, lightheaded. She has a dry tongue. She reports decreased urinary output. She reports lower abdominal pain that is intermittent. She denies fever or chills. She reports she has lost about 50 pounds. She states she's supposed to have a endoscopy done in 2 days by Dr. Ardis Hughs. She reports she gets a bag of normal saline IV fluids on Monday, Wednesday, and Friday. She states afterward she flushes with heparin. She states they were unable to access her [facility] line yesterday to get her IV fluids. She presents to the emergency department today to have her PICC line assessed.  PCP Dr Alona Bene GI Dr Wendi Snipes Dr Lilyan Punt  Past Medical History  Diagnosis Date  . Hypertension   . Diabetes mellitus   . Gout   . Depression   . Gout   . Hyperlipidemia   . Blood transfusion without reported diagnosis   . Morbid obesity   . Chronic kidney disease     STAGE 3 CHRONIC  KIDNEY DISEASE SECONDARY TO DIABETIC GLOMERULOSCLEROSIS AND UNCONTROLLED HYPERTENSION - PER OFFICE NOTES DR. Florene Glen -[facility].  Marland Kitchen Headache(784.0)     CHRONIC H/A'S  . Pain     LEFT SHOULDER PAIN - WAS SEEN AT AN URGENT CARE - GIVEN SLING FOR COMFORT AND TOLD ROM AS TOLERATED.   Past Surgical History  Procedure Laterality Date  . Cesarean section  1988, 1993  . Carpal tunnel release  2008  . Laparoscopic gastric sleeve resection N/A 07/19/2012    Procedure: LAPAROSCOPIC SLEEVE GASTRECTOMY with EGD;  Surgeon: Madilyn Hook, DO;  Location: [facility];  Service: General;  Laterality: N/A;  laparoscopic sleeve gastrectomy with EGD  . Upper gi endoscopy N/A 07/19/2012    Procedure: UPPER GI ENDOSCOPY;  Surgeon: Madilyn Hook, DO;  Location: [facility];  Service: General;  Laterality: N/A;  . Esophagogastroduodenoscopy N/A 09/04/2012    Procedure: ESOPHAGOGASTRODUODENOSCOPY (EGD);  Surgeon: Shann Medal, MD;  Location: [facility];  Service: General;  Laterality: N/A;  PF   Family History  Problem Relation Age of Onset  . Hypertension Mother   . Diabetes Father   . Arthritis Other   . Hyperlipidemia Other   . Cancer Neg Hx   . Heart disease Neg Hx   . Kidney disease Neg Hx   . Stroke Neg Hx    History  Substance Use Topics  . Smoking status: Former Smoker --  20 years    Types: Cigarettes  . Smokeless tobacco: Never Used     Comment: QUIT SMOKING ABOUT 2009  . Alcohol Use: No  employed   OB History   Grav Para Term Preterm Abortions TAB SAB Ect Mult Living                 Review of Systems  All other systems reviewed and are negative.    Allergies  Amlodipine; Clonidine derivatives; and Welchol  Home Medications   Current Outpatient Rx  Name  Route  Sig  Dispense  Refill  . hydrALAZINE (APRESOLINE) 50 MG tablet   Oral   Take 1 tablet (50 mg total) by mouth every 8 (eight) hours.   90 tablet   0   . magnesium hydroxide (MILK OF MAGNESIA) 400 MG/5ML suspension    Oral   Take 30 mLs by mouth daily as needed for constipation.         . nebivolol (BYSTOLIC) 10 MG tablet   Oral   Take 10 mg by mouth every evening.          . ondansetron (ZOFRAN ODT) 4 MG disintegrating tablet   Oral   Take 1 tablet (4 mg total) by mouth every 8 (eight) hours as needed for nausea.   20 tablet   0   . rosuvastatin (CRESTOR) 20 MG tablet   Oral   Take 20 mg by mouth every morning.          BP 184/111  Pulse 73  Temp(Src) 98.6 F (37 C) (Oral)  Resp 20  SpO2 100%  Vital signs normal   Physical Exam  Nursing note and vitals reviewed. Constitutional: She is oriented to person, place, and time. She appears well-developed and well-nourished.  Non-toxic appearance. She does not appear ill. No distress.  HENT:  Head: Normocephalic and atraumatic.  Right Ear: External ear normal.  Left Ear: External ear normal.  Nose: Nose normal. No mucosal edema or rhinorrhea.  Mouth/Throat: Mucous membranes are normal. No dental abscesses or edematous.  Dry tongue  Eyes: Conjunctivae and EOM are normal. Pupils are equal, round, and reactive to light.  Neck: Normal range of motion and full passive range of motion without pain. Neck supple.  Cardiovascular: Normal rate, regular rhythm and normal heart sounds.  Exam reveals no gallop and no friction rub.   No murmur heard. Pulmonary/Chest: Effort normal and breath sounds normal. No respiratory distress. She has no wheezes. She has no rhonchi. She has no rales. She exhibits no tenderness and no crepitus.  Abdominal: Soft. Normal appearance and bowel sounds are normal. She exhibits no distension. There is no tenderness. There is no rebound and no guarding.  Musculoskeletal: Normal range of motion. She exhibits no edema and no tenderness.  Moves all extremities well.   Neurological: She is alert and oriented to person, place, and time. She has normal strength. No cranial nerve deficit.  Skin: Skin is warm, dry and intact. No  rash noted. No erythema. No pallor.  Psychiatric: Her speech is normal and behavior is normal. Her mood appears not anxious.  Flat affect    ED Course  Procedures (including critical care time)  Medications  alteplase (CATHFLO ACTIVASE) injection 2 mg (2 mg Intracatheter Given 09/27/12 0929)  metoCLOPramide (REGLAN) injection 10 mg (10 mg Intramuscular Given 09/27/12 1048)  diphenhydrAMINE (BENADRYL) injection 25 mg (25 mg Intramuscular Given 09/27/12 1047)  0.9 %  sodium chloride infusion (0 mLs Intravenous Stopped 09/27/12  1413)    Followed by  0.9 %  sodium chloride infusion (1,000 mLs Intravenous New Bag/Given 09/27/12 1412)     Pt given choice of waiting for IV team to assess her PICC and do TKN or having our nursing staff go ahead and start another peripheral IV to start her IV fluids, instead of waiting to have her PICC assessed and she chose to wait.   13:00 Pt states she wants me to let Dr Lilyan Punt know she isn't feeling better, but isn't worse.   13:14 Dr Lilyan Punt states she has the EDG is scheduled in 2 days and he isn't going to change her treatment plan until that is done.   Pt relayed that Dr Lilyan Punt wants to wait until she has the EDG to change any of her regimine and she is agreeable.   Labs Review  Results for orders placed during the hospital encounter of 09/27/12  CBC WITH DIFFERENTIAL      Result Value Range   WBC 4.9  4.0 - 10.5 K/uL   RBC 3.70 (*) 3.87 - 5.11 MIL/uL   Hemoglobin 10.0 (*) 12.0 - 15.0 g/dL   HCT 30.3 (*) 36.0 - 46.0 %   MCV 81.9  78.0 - 100.0 fL   MCH 27.0  26.0 - 34.0 pg   MCHC 33.0  30.0 - 36.0 g/dL   RDW 16.0 (*) 11.5 - 15.5 %   Platelets 280  150 - 400 K/uL   Neutrophils Relative % 47  43 - 77 %   Neutro Abs 2.3  1.7 - 7.7 K/uL   Lymphocytes Relative 23  12 - 46 %   Lymphs Abs 1.1  0.7 - 4.0 K/uL   Monocytes Relative 15 (*) 3 - 12 %   Monocytes Absolute 0.7  0.1 - 1.0 K/uL   Eosinophils Relative 16 (*) 0 - 5 %   Eosinophils Absolute 0.8 (*) 0.0  - 0.7 K/uL   Basophils Relative 0  0 - 1 %   Basophils Absolute 0.0  0.0 - 0.1 K/uL  COMPREHENSIVE METABOLIC PANEL      Result Value Range   Sodium 138  135 - 145 mEq/L   Potassium 3.4 (*) 3.5 - 5.1 mEq/L   Chloride 104  96 - 112 mEq/L   CO2 22  19 - 32 mEq/L   Glucose, Bld 81  70 - 99 mg/dL   BUN 17  6 - 23 mg/dL   Creatinine, Ser 2.08 (*) 0.50 - 1.10 mg/dL   Calcium 8.9  8.4 - 10.5 mg/dL   Total Protein 6.2  6.0 - 8.3 g/dL   Albumin 2.4 (*) 3.5 - 5.2 g/dL   AST 21  0 - 37 U/L   ALT 15  0 - 35 U/L   Alkaline Phosphatase 129 (*) 39 - 117 U/L   Total Bilirubin 0.4  0.3 - 1.2 mg/dL   GFR calc non Af Amer 27 (*) >90 mL/min   GFR calc Af Amer 32 (*) >90 mL/min  URINALYSIS, ROUTINE W REFLEX MICROSCOPIC      Result Value Range   Color, Urine YELLOW  YELLOW   APPearance CLOUDY (*) CLEAR   Specific Gravity, Urine 1.025  1.005 - 1.030   pH 6.0  5.0 - 8.0   Glucose, UA 100 (*) NEGATIVE mg/dL   Hgb urine dipstick SMALL (*) NEGATIVE   Bilirubin Urine MODERATE (*) NEGATIVE   Ketones, ur 40 (*) NEGATIVE mg/dL   Protein, ur >300 (*) NEGATIVE mg/dL  Urobilinogen, UA 1.0  0.0 - 1.0 mg/dL   Nitrite NEGATIVE  NEGATIVE   Leukocytes, UA NEGATIVE  NEGATIVE  URINE MICROSCOPIC-ADD ON      Result Value Range   Squamous Epithelial / LPF RARE  RARE   WBC, UA 7-10  <3 WBC/hpf   RBC / HPF 0-2  <3 RBC/hpf   Bacteria, UA FEW (*) RARE   Casts GRANULAR CAST (*) NEGATIVE   Imaging Review No results found.   Ct Abdomen Pelvis Wo Contrast  09/02/2012     IMPRESSION: No acute findings.  Stable changes from previous gastric surgery.  A few left colon diverticula.  No diverticulitis.  The bowel is otherwise unremarkable.  No change from prior CT.   Original Report Authenticated By: Lajean Manes, M.D.   Dg Abd 1 View  09/02/2012 .  IMPRESSION: No acute finding by plain radiography   Original Report Authenticated By: Jerilynn Mages. Annamaria Boots, M.D.    Darletta Moll Duanne Limerick Charmayne Sheer  09/05/2012   IMPRESSION: Expected postoperative  appearance of the stomach status post vertical sleeve gastrectomy without evidence for leak.  No evidence for significant narrowing as the contrast column passed adequately from the esophagus to the stomach and proximal small bowel.   Original Report Authenticated By: Lovey Newcomer, M.D      MDM  patient presents with chronic nausea and vomiting for about 6 weeks and she had a gastric sleeve place. She is being closely followed by her surgeon and her gastroenterologist. Her lab work today is improved with improving of her chronic renal insufficiency. She was given IV fluids. Patient has the ability to get IV fluids at home. She is being discharged.    1. Nausea vomiting and diarrhea   2. Dehydration   3. Occluded PICC line, initial encounter     New Prescriptions   PROMETHAZINE (PHENERGAN) 25 MG SUPPOSITORY    Place 1 suppository (25 mg total) rectally every 6 (six) hours as needed for nausea.    Plan discharge   Rolland Porter, MD, Alanson Aly, MD 09/27/12 [phone]

## 2012-09-28 ENCOUNTER — Telehealth (INDEPENDENT_AMBULATORY_CARE_PROVIDER_SITE_OTHER): Payer: Self-pay

## 2012-09-28 LAB — URINE CULTURE: Colony Count: 60000

## 2012-09-28 NOTE — Telephone Encounter (Signed)
Spoke to [employer], RN [facility] and she was given verbal order to continue home health once a week for patient until D/C by Dr. Lilyan Punt.

## 2012-09-28 NOTE — Telephone Encounter (Signed)
The nurse Kim called from [facility].  She would like an order to extend her visits.  She goes out once a week and feels like she still needs care.  Her certification goes until 10/23 and she would like orders to go out that long.  Please call.

## 2012-09-29 ENCOUNTER — Encounter (HOSPITAL_COMMUNITY): Payer: Self-pay

## 2012-09-29 ENCOUNTER — Encounter (HOSPITAL_COMMUNITY)
Admission: RE | Disposition: A | Discharge status: Home / Self Care | Payer: BC Managed Care – PPO | Source: Ambulatory Visit | Attending: Gastroenterology

## 2012-09-29 ENCOUNTER — Ambulatory Visit (HOSPITAL_COMMUNITY)
Admission: RE | Admit: 2012-09-29 | Discharge: 2012-09-29 | Disposition: A | Discharge status: Home / Self Care | Payer: BC Managed Care – PPO | Source: Ambulatory Visit | Attending: Gastroenterology | Admitting: Gastroenterology

## 2012-09-29 DIAGNOSIS — R1115 Cyclical vomiting syndrome unrelated to migraine: Secondary | ICD-10-CM | POA: Insufficient documentation

## 2012-09-29 DIAGNOSIS — Z9884 Bariatric surgery status: Secondary | ICD-10-CM | POA: Insufficient documentation

## 2012-09-29 DIAGNOSIS — K449 Diaphragmatic hernia without obstruction or gangrene: Secondary | ICD-10-CM | POA: Insufficient documentation

## 2012-09-29 DIAGNOSIS — R933 Abnormal findings on diagnostic imaging of other parts of digestive tract: Secondary | ICD-10-CM

## 2012-09-29 DIAGNOSIS — R112 Nausea with vomiting, unspecified: Secondary | ICD-10-CM

## 2012-09-29 HISTORY — PX: ESOPHAGOGASTRODUODENOSCOPY (EGD) WITH ESOPHAGEAL DILATION: SHX5812

## 2012-09-29 SURGERY — ESOPHAGOGASTRODUODENOSCOPY (EGD) WITH ESOPHAGEAL DILATION
Anesthesia: Moderate Sedation

## 2012-09-29 MED ORDER — FENTANYL CITRATE 0.05 MG/ML IJ SOLN
INTRAMUSCULAR | Status: AC
  Filled 2012-09-29: qty 2

## 2012-09-29 MED ORDER — FENTANYL CITRATE 0.05 MG/ML IJ SOLN
INTRAMUSCULAR | Status: DC | PRN
  Administered 2012-09-29 (×4): 25 ug via INTRAVENOUS

## 2012-09-29 MED ORDER — SODIUM CHLORIDE 0.9 % IV SOLN: INTRAVENOUS | Status: DC

## 2012-09-29 MED ORDER — SODIUM CHLORIDE 0.9 % IJ SOLN
10.0000 mL | INTRAMUSCULAR | Status: DC | PRN
Start: 2012-09-29 — End: 2012-09-29
  Administered 2012-09-29: 10 mL

## 2012-09-29 MED ORDER — MIDAZOLAM HCL 10 MG/2ML IJ SOLN
INTRAMUSCULAR | Status: AC
  Filled 2012-09-29: qty 2

## 2012-09-29 MED ORDER — HEPARIN SOD (PORK) LOCK FLUSH 100 UNIT/ML IV SOLN
250.0000 [IU] | INTRAVENOUS | Status: AC | PRN
  Administered 2012-09-29: 250 [IU]

## 2012-09-29 MED ORDER — BUTAMBEN-TETRACAINE-BENZOCAINE 2-2-14 % EX AERO
INHALATION_SPRAY | CUTANEOUS | Status: DC | PRN
  Administered 2012-09-29: 2 via TOPICAL

## 2012-09-29 MED ORDER — MIDAZOLAM HCL 10 MG/2ML IJ SOLN
INTRAMUSCULAR | Status: DC | PRN
  Administered 2012-09-29 (×3): 2 mg via INTRAVENOUS
  Administered 2012-09-29 (×2): 1 mg via INTRAVENOUS

## 2012-09-29 NOTE — Op Note (Signed)
[facility] [place], [id number]   ENDOSCOPY PROCEDURE REPORT  PATIENT: Brenda Davis, Brenda Davis  MR#: [id number] BIRTHDATE: March 02, 1965 , 22  yrs. old GENDER: Female ENDOSCOPIST: Milus Banister, MD REFERRED BY:  Madilyn Hook, MD PROCEDURE DATE:  09/29/2012 PROCEDURE:  EGD, balloon dilation ASA CLASS:     Class II INDICATIONS:  Sleeve gastrectomy early July 2014; persistent vomiting/spitting up for 6 weeks. MEDICATIONS: Fentanyl 100 mcg IV and Versed 6 mg IV TOPICAL ANESTHETIC: Cetacaine Spray  DESCRIPTION OF PROCEDURE: After the risks benefits and alternatives of the procedure were thoroughly explained, informed consent was obtained.  The endoscope endoscope was introduced through the mouth and advanced to the second portion of the duodenum. Without limitations.  The instrument was slowly withdrawn as the mucosa was fully examined.    There was a small (<1cm) hiatal hernia.  The gatric sleeve was normal appearing, without any focal areas of narrowing.  The pylorus was the most narrow point in UGI tract and although it was not clearly strictured appearing, this was dilated with 61m CRE TTS balloon.  There was the usual superficial mucosal tear and self limited oozing following dilation.  That same 267mballoon was then used to confirm the fact that the gastric sleeve contained no strictures (the balloon, fully inflated, pulled through the sleeve up into the GE junction without resistence).  The examination was otherwise normal.  Retroflexion was not performed.     The scope was then withdrawn from the patient and the procedure completed. COMPLICATIONS: There were no complications. ENDOSCOPIC IMPRESSION: See above  RECOMMENDATIONS: Please call Dr.  JaArdis Hughsr Dr.  LaLilyan Puntn one week if you have not noticed clear improvement in your symptoms following todays procedure, dilation.  The plan is to use scopalimine patch and or levbid for their  anti-salivary side effects.  The could help with excessive phlegm production.   eSigned:  DaMilus BanisterMD 09/29/2012 8:19 AM

## 2012-09-30 ENCOUNTER — Telehealth (INDEPENDENT_AMBULATORY_CARE_PROVIDER_SITE_OTHER): Payer: Self-pay | Admitting: General Surgery

## 2012-09-30 ENCOUNTER — Encounter (HOSPITAL_COMMUNITY): Payer: Self-pay | Admitting: Gastroenterology

## 2012-09-30 NOTE — Telephone Encounter (Signed)
I called her at home to see how she was feeling after her procedure.  I got her voice mail.  I left a message.

## 2012-10-03 ENCOUNTER — Telehealth: Payer: Self-pay | Admitting: Gastroenterology

## 2012-10-03 ENCOUNTER — Telehealth (INDEPENDENT_AMBULATORY_CARE_PROVIDER_SITE_OTHER): Payer: Self-pay | Admitting: General Surgery

## 2012-10-03 ENCOUNTER — Other Ambulatory Visit (INDEPENDENT_AMBULATORY_CARE_PROVIDER_SITE_OTHER): Payer: Self-pay | Admitting: General Surgery

## 2012-10-03 MED ORDER — HYOSCYAMINE SULFATE ER 0.375 MG PO TB12: 0.3750 mg | ORAL_TABLET | Freq: Two times a day (BID) | ORAL | Status: DC | PRN

## 2012-10-03 MED ORDER — SUCRALFATE 1 GM/10ML PO SUSP: 1.0000 g | Freq: Two times a day (BID) | ORAL | Status: DC

## 2012-10-03 NOTE — Telephone Encounter (Signed)
Pt would like to have Levbid sent to the pharmacy, Dr Ardis Hughs stated in his procedure note that she can try scopalamine patch and /or Levbid.  I consulted with Alonza Bogus and Levbid was sent

## 2012-10-03 NOTE — Telephone Encounter (Signed)
Pt calling c/o constant nausea with occasional vomiting.  She states it is just liquid that comes up.  She is taking Zofran and using a patch, but is not getting much relief.  She is having diarrhea and stomach pain.  No fever, but some chills.  Message relayed to Dr. Lilyan Punt who will call her back.

## 2012-10-03 NOTE — Telephone Encounter (Signed)
She called and said that she hasnt had any change in her symptoms.  She vomited 2 times on Friday and 4 times yesterday.  She says that she feels about the same as she has previously.  She has had some diarrhea with some dark color which is likely due to the bleeding from the procedure.  No fevers.  Abdominal pain is about the same.  Will try carafate for possible bile reflux and trial Levbid per Dr. Ardis Hughs.  If no improvement with these, then will likely need dobhoff tube for nutrition.

## 2012-10-04 ENCOUNTER — Encounter (INDEPENDENT_AMBULATORY_CARE_PROVIDER_SITE_OTHER): Payer: Self-pay

## 2012-10-06 NOTE — H&P (Signed)
09/26/2012  Brenda Davis  [id number]  1965-02-17  HISTORY OF PRESENT ILLNESS: Patient is a 47 year old female who was referred to our practice by Dr. Lilyan Punt for her persistent nausea and vomiting. She underwent sleeve gastrectomy on July 1 without any immediate complications or postsurgical issues. However, since the end of July she has had persistent nausea and vomiting. She says that yogurt and water are usually the only things that she can keep down. She has nausea and vomiting even when she doesn't eat. She has been readmitted to the hospital on 2 occasions with CT scans being unremarkable both times. She's had one repeat endoscopy by Dr. Lucia Gaskins, which was unremarkable as well. She has tried Reglan, Zofran, protonix, Phenergan, and scopolamine patch without much improvement in her symptoms. According to Dr. Dyane Dustman last note, he was going to schedule her for another endoscopy. To her knowledge this was supposed to be the next step in evaluation as well. She reports that there were no issues similar to this prior to her surgery. Says that she does not have many issues with vomiting at night (there has only been a couple of occasions of when she woke up and vomited at night).  Past Medical History   Diagnosis  Date   .  Hypertension    .  Diabetes mellitus    .  Gout    .  Depression    .  Gout    .  Hyperlipidemia    .  Blood transfusion without reported diagnosis    .  Morbid obesity    .  Chronic kidney disease      STAGE 3 CHRONIC KIDNEY DISEASE SECONDARY TO DIABETIC GLOMERULOSCLEROSIS AND UNCONTROLLED HYPERTENSION - PER OFFICE NOTES DR. Florene Glen -[facility]   Marland Kitchen  Headache(784.0)      CHRONIC H/A'S   .  Pain      LEFT SHOULDER PAIN - WAS SEEN AT AN URGENT CARE - GIVEN SLING FOR COMFORT AND TOLD ROM AS TOLERATED.    Past Surgical History   Procedure  Laterality  Date   .  Cesarean section   1988, 1993   .  Carpal tunnel release   2008   .  Laparoscopic gastric sleeve resection   N/A  07/19/2012     Procedure: LAPAROSCOPIC SLEEVE GASTRECTOMY with EGD; Surgeon: Madilyn Hook, DO; Location: [facility]; Service: General; Laterality: N/A; laparoscopic sleeve gastrectomy with EGD   .  Upper gi endoscopy  N/A  07/19/2012     Procedure: UPPER GI ENDOSCOPY; Surgeon: Madilyn Hook, DO; Location: [facility]; Service: General; Laterality: N/A;   .  Esophagogastroduodenoscopy  N/A  09/04/2012     Procedure: ESOPHAGOGASTRODUODENOSCOPY (EGD); Surgeon: Shann Medal, MD; Location: [facility]; Service: General; Laterality: N/A; PF   reports that she has quit smoking. Her smoking use included Cigarettes. She smoked 0.00 packs per day for 20 years. She has never used smokeless tobacco. She reports that she does not drink alcohol or use illicit drugs.  family history includes Arthritis in her other; Diabetes in her father; Hyperlipidemia in her other; Hypertension in her mother. There is no history of Cancer, Heart disease, Kidney disease, or Stroke.  Allergies   Allergen  Reactions   .  Amlodipine      edema   .  Clonidine Derivatives  Swelling   .  Welchol [Colesevelam Hcl]  Nausea Only    Outpatient Encounter Prescriptions as of 09/26/2012   Medication  Sig  Dispense  Refill   .  hydrALAZINE (APRESOLINE) 50 MG tablet  Take 1 tablet (50 mg total) by mouth every 8 (eight) hours.  90 tablet  0   .  nebivolol (BYSTOLIC) 10 MG tablet  Take 10 mg by mouth every evening.     .  magnesium hydroxide (MILK OF MAGNESIA) 400 MG/5ML suspension  Take 30 mLs by mouth daily as needed for constipation.     .  metoCLOPramide (REGLAN) 5 MG/5ML solution  Take 10 mLs (10 mg total) by mouth 3 (three) times daily before meals.  240 mL  1   .  ondansetron (ZOFRAN ODT) 4 MG disintegrating tablet  Take 1 tablet (4 mg total) by mouth every 8 (eight) hours as needed for nausea.  20 tablet  0   .  ondansetron (ZOFRAN ODT) 4 MG disintegrating tablet  Take 1 tablet (4 mg total) by mouth every 8 (eight) hours as needed for  nausea.  30 tablet  0   .  pantoprazole (PROTONIX) 40 MG tablet  Take 1 tablet (40 mg total) by mouth daily.  90 tablet  1   .  promethazine (PHENERGAN) 25 MG suppository  Place 1 suppository (25 mg total) rectally every 6 (six) hours as needed for nausea.  30 each  0   .  rosuvastatin (CRESTOR) 20 MG tablet  Take 20 mg by mouth every morning.     Marland Kitchen  scopolamine (TRANSDERM-SCOP) 1.5 MG  Place 1 patch (1.5 mg total) onto the skin every 3 (three) days.  10 patch  2    No facility-administered encounter medications on file as of 09/26/2012.   REVIEW OF SYSTEMS : All other systems reviewed and negative except where noted in the History of Present Illness.  PHYSICAL EXAM:  BP 130/88  Pulse 70  Ht 5' 4"  (1.626 m)  Wt 248 lb (112.492 kg)  BMI 42.55 kg/m2  General: Well developed black female in no acute distress  Head: Normocephalic and atraumatic  Eyes: sclerae anicteric, conjunctive pink.  Ears: Normal auditory acuity  Lungs: Clear throughout to auscultation  Heart: Regular rate and rhythm  Abdomen: Soft, non-distended. BS present. Non-tender.  Musculoskeletal: Symmetrical with no gross deformities  Skin: No lesions on visible extremities  Extremities: No edema  Neurological: Alert oriented x 4, grossly nonfocal  Psychological: Alert and cooperative. Normal mood and affect  ASSESSMENT AND PLAN:  -Nausea and vomiting post gastric sleeve: I am going to contact Dr. Lilyan Punt to see if I can get an idea of what kind of services that he had in mind for [place] to provide to evaluate/help her symptoms. I offered the patient a GES but she declined for now stating that she would like to see what Dr. Lilyan Punt says, plus she did not think that she could eat the required egg, etc without vomiting.

## 2012-10-10 ENCOUNTER — Telehealth (INDEPENDENT_AMBULATORY_CARE_PROVIDER_SITE_OTHER): Payer: Self-pay

## 2012-10-10 ENCOUNTER — Encounter (INDEPENDENT_AMBULATORY_CARE_PROVIDER_SITE_OTHER): Payer: Self-pay

## 2012-10-10 NOTE — Telephone Encounter (Signed)
RTW note faxed to [employer] attn: Shaunta @ [phone] for patient.  Fax confirmation rec'd.

## 2012-10-10 NOTE — Telephone Encounter (Signed)
Patient called in stating she is weak and cannot even walk to bathroom with out difficulty. She states she is able to eat a little now but not much. No more n/v. Advised she should try some protein shakes and drink a lot of water to stay hydrated sine she had so much n/v. She states she has been doing the shakes and is drinking a lot of water. Advised I will send a message to Dr Lilyan Punt and we will call her back if he has anything else to suggest for her.

## 2012-10-11 ENCOUNTER — Telehealth (INDEPENDENT_AMBULATORY_CARE_PROVIDER_SITE_OTHER): Payer: Self-pay | Admitting: General Surgery

## 2012-10-11 ENCOUNTER — Telehealth: Payer: Self-pay | Admitting: *Deleted

## 2012-10-11 NOTE — Telephone Encounter (Signed)
LVM

## 2012-10-11 NOTE — Telephone Encounter (Signed)
I called her to see how she was doing and she says that she is "on the upswing".  She is eating more and staying hydrated although she is "still afraid to eat".  Her gagging is down to 1/day and she says that the carafate has helped.  Her only complaint is some weakness and lack of energy.  I was planning on placing a dobhoff but since she says that she is staying hydrated, we will hold on this and I have asked our nutritionist to get in touch with her to keep a week's food journal to record her intake and to review it to make sure that it is adequate.  We will see how much protein, fluids and nutrition that she is getting.

## 2012-10-25 ENCOUNTER — Encounter (INDEPENDENT_AMBULATORY_CARE_PROVIDER_SITE_OTHER): Payer: Self-pay

## 2012-10-26 ENCOUNTER — Telehealth (HOSPITAL_COMMUNITY): Payer: Self-pay | Admitting: Radiology

## 2012-10-26 ENCOUNTER — Telehealth (INDEPENDENT_AMBULATORY_CARE_PROVIDER_SITE_OTHER): Payer: Self-pay | Admitting: General Surgery

## 2012-10-26 ENCOUNTER — Telehealth (INDEPENDENT_AMBULATORY_CARE_PROVIDER_SITE_OTHER): Payer: Self-pay

## 2012-10-26 ENCOUNTER — Other Ambulatory Visit (INDEPENDENT_AMBULATORY_CARE_PROVIDER_SITE_OTHER): Payer: Self-pay

## 2012-10-26 NOTE — Telephone Encounter (Signed)
I spoke with her today on phone.  She sounds great and has not had any more nausea or vomiting.  The carafate has really helped her.  She is no longer getting fluid infusions.

## 2012-10-26 NOTE — Telephone Encounter (Signed)
Rec'd call from [facility] at Dr. Dyane Dustman office to have PICC removed.  Instructed to send patient over and we would work her in.  There is not a Radiology [facility] order for PICC removal, so I asked her to fax an order and I have scanned it for documentation.

## 2012-10-26 NOTE — Telephone Encounter (Signed)
Fax order for PICC removal for patient to IR @ [facility].  Patient notified and will go today for PICC removal

## 2012-10-28 ENCOUNTER — Telehealth (INDEPENDENT_AMBULATORY_CARE_PROVIDER_SITE_OTHER): Payer: Self-pay

## 2012-10-28 NOTE — Telephone Encounter (Signed)
Anderson Malta M/C states patient did not come in for picc line placement  # [phone]

## 2012-10-31 NOTE — Telephone Encounter (Signed)
Patient return to call our office.  Explained to patient that if she's not using PICC Line it need's to come out.  She's putting herself at high risk for infection, possible blood clot.  Patient aware and plans to go tomorrow on 11/01/12 for PICC Line Removal

## 2012-10-31 NOTE — Telephone Encounter (Signed)
Follow up call to patient.  Rec'd message from [place] on 10/28/12 stating that Anderson Malta in IR called to report patient has yet to come in for PICC Line removal.  Patient did not answer the phone, left voice message to see how she's doing and see if she's had her PICC Line removed yet.

## 2012-11-01 ENCOUNTER — Telehealth (HOSPITAL_COMMUNITY): Payer: Self-pay | Admitting: Radiology

## 2012-11-01 ENCOUNTER — Telehealth (INDEPENDENT_AMBULATORY_CARE_PROVIDER_SITE_OTHER): Payer: Self-pay

## 2012-11-01 NOTE — Telephone Encounter (Signed)
Called patient to see why she hasn't went to have PICC Line removed.  Explained to patient the risk that she's putting herself in by keeping an unused PICC Line in place.  Patient made aware that she's at higher risk now for infection or possible blood clots.  Patient expressed understanding and states she will go on Tuesday (11/01/12).

## 2012-11-01 NOTE — Telephone Encounter (Signed)
Called Christie RN at [facility] in inform her that patient has not called to arrange for PICC removal.

## 2012-11-01 NOTE — Telephone Encounter (Signed)
Incoming call from [place] in IR reporting that patient has yet to show up for PICC Line removal.  Order was placed on 10/26/2012 for PICC Line Removal and patient was notified at that time.

## 2012-11-02 ENCOUNTER — Telehealth (INDEPENDENT_AMBULATORY_CARE_PROVIDER_SITE_OTHER): Payer: Self-pay

## 2012-11-02 NOTE — Telephone Encounter (Addendum)
Called and spoke to patient regarding her BP.  But, before I discussed anything in detail with patient I checked to see if she had her PICC Line removed.  Patient reports she went yesterday (11/01/12) for removal.   Patient reports she went yesterday.  Called IR to confirm that patient had PICC Line removed.  Per Tye [place] patient came in yesterday afternoon for removal.  Patient advised that she should see her PCP for her blood pressure.  Patient aware that Dr. Lilyan Punt will be in the office tomorrow and I will leave patient's RTW request for his review.  We will call patient once Dr. Lilyan Punt has reviewed.

## 2012-11-02 NOTE — Telephone Encounter (Signed)
Pt called stating she was advised by Dr Lilyan Punt to RTW 11-04-12. Pt states her BP has been running more elevated than usual for a few days and wants extension to be out of work longer. Pt states Dr Ronnald Ramp is her PCP and usually manages her BP. I advised pt she needs to make appt with her PCP tomorrow and have him evaluate her BP issues. If he feels she needs to be oow due to BP he can extend her leave of absence. Pt advised we do not normally treat HTN as an out pt that this is usually treated by the pts PCP. She states she understands and will see Dr Ronnald Ramp tomorrow.

## 2012-11-03 ENCOUNTER — Telehealth (INDEPENDENT_AMBULATORY_CARE_PROVIDER_SITE_OTHER): Payer: Self-pay | Admitting: General Surgery

## 2012-11-03 NOTE — Telephone Encounter (Signed)
Called patient to let her know that she will need to call her PCP about her BP issues, per Dr Lilyan Punt. I LMOM letting her know that

## 2012-11-20 ENCOUNTER — Other Ambulatory Visit (INDEPENDENT_AMBULATORY_CARE_PROVIDER_SITE_OTHER): Payer: Self-pay | Admitting: Surgery

## 2012-11-21 NOTE — Telephone Encounter (Signed)
Needs to see PMD for this refill

## 2012-11-24 ENCOUNTER — Other Ambulatory Visit: Payer: Self-pay

## 2012-11-28 ENCOUNTER — Ambulatory Visit (INDEPENDENT_AMBULATORY_CARE_PROVIDER_SITE_OTHER): Payer: BC Managed Care – PPO | Admitting: Internal Medicine

## 2012-11-28 ENCOUNTER — Encounter: Payer: Self-pay | Admitting: Internal Medicine

## 2012-11-28 ENCOUNTER — Other Ambulatory Visit (INDEPENDENT_AMBULATORY_CARE_PROVIDER_SITE_OTHER): Payer: BC Managed Care – PPO

## 2012-11-28 VITALS — BP 170/110 | HR 65 | Temp 98.4°F | Resp 16 | Ht 64.0 in | Wt 234.0 lb

## 2012-11-28 DIAGNOSIS — I10 Essential (primary) hypertension: Secondary | ICD-10-CM

## 2012-11-28 DIAGNOSIS — R809 Proteinuria, unspecified: Secondary | ICD-10-CM

## 2012-11-28 DIAGNOSIS — E785 Hyperlipidemia, unspecified: Secondary | ICD-10-CM

## 2012-11-28 DIAGNOSIS — E876 Hypokalemia: Secondary | ICD-10-CM

## 2012-11-28 DIAGNOSIS — D529 Folate deficiency anemia, unspecified: Secondary | ICD-10-CM

## 2012-11-28 DIAGNOSIS — D519 Vitamin B12 deficiency anemia, unspecified: Secondary | ICD-10-CM | POA: Insufficient documentation

## 2012-11-28 DIAGNOSIS — G5711 Meralgia paresthetica, right lower limb: Secondary | ICD-10-CM

## 2012-11-28 DIAGNOSIS — N183 Chronic kidney disease, stage 3 unspecified: Secondary | ICD-10-CM

## 2012-11-28 DIAGNOSIS — D510 Vitamin B12 deficiency anemia due to intrinsic factor deficiency: Secondary | ICD-10-CM | POA: Insufficient documentation

## 2012-11-28 DIAGNOSIS — G5710 Meralgia paresthetica, unspecified lower limb: Secondary | ICD-10-CM

## 2012-11-28 DIAGNOSIS — D649 Anemia, unspecified: Secondary | ICD-10-CM

## 2012-11-28 DIAGNOSIS — Z9884 Bariatric surgery status: Secondary | ICD-10-CM | POA: Insufficient documentation

## 2012-11-28 DIAGNOSIS — E1129 Type 2 diabetes mellitus with other diabetic kidney complication: Secondary | ICD-10-CM

## 2012-11-28 LAB — CBC WITH DIFFERENTIAL/PLATELET
Basophils Absolute: 0 10*3/uL (ref 0.0–0.1)
Basophils Relative: 0.1 % (ref 0.0–3.0)
Eosinophils Absolute: 0.2 10*3/uL (ref 0.0–0.7)
Eosinophils Relative: 4.2 % (ref 0.0–5.0)
HCT: 30 % — ABNORMAL LOW (ref 36.0–46.0)
Hemoglobin: 10 g/dL — ABNORMAL LOW (ref 12.0–15.0)
Lymphocytes Relative: 31.3 % (ref 12.0–46.0)
Lymphs Abs: 1.3 10*3/uL (ref 0.7–4.0)
MCHC: 33.4 g/dL (ref 30.0–36.0)
MCV: 85.8 fl (ref 78.0–100.0)
Monocytes Absolute: 0.7 10*3/uL (ref 0.1–1.0)
Monocytes Relative: 15.3 % — ABNORMAL HIGH (ref 3.0–12.0)
Neutro Abs: 2.1 10*3/uL (ref 1.4–7.7)
Neutrophils Relative %: 49.1 % (ref 43.0–77.0)
Platelets: 414 10*3/uL — ABNORMAL HIGH (ref 150.0–400.0)
RBC: 3.49 Mil/uL — ABNORMAL LOW (ref 3.87–5.11)
RDW: 16.4 % — ABNORMAL HIGH (ref 11.5–14.6)
WBC: 4.3 10*3/uL — ABNORMAL LOW (ref 4.5–10.5)

## 2012-11-28 LAB — URINALYSIS, ROUTINE W REFLEX MICROSCOPIC
Bilirubin Urine: NEGATIVE
Ketones, ur: NEGATIVE
Leukocytes, UA: NEGATIVE
Nitrite: NEGATIVE
Specific Gravity, Urine: 1.025 (ref 1.000–1.030)
Total Protein, Urine: >=300
Urine Glucose: NEGATIVE
Urobilinogen, UA: 0.2 (ref 0.0–1.0)
pH: 6.5 (ref 5.0–8.0)

## 2012-11-28 LAB — LIPID PANEL
Cholesterol: 239 mg/dL — ABNORMAL HIGH (ref 0–200)
HDL: 50.6 mg/dL (ref >39.00)
Total CHOL/HDL Ratio: 5
Triglycerides: 110 mg/dL (ref 0.0–149.0)
VLDL: 22 mg/dL (ref 0.0–40.0)

## 2012-11-28 LAB — BASIC METABOLIC PANEL
BUN: 33 mg/dL — ABNORMAL HIGH (ref 6–23)
CO2: 26 mEq/L (ref 19–32)
Calcium: 9.2 mg/dL (ref 8.4–10.5)
Chloride: 108 mEq/L (ref 96–112)
Creatinine, Ser: 2.3 mg/dL — ABNORMAL HIGH (ref 0.4–1.2)
GFR: 28.82 mL/min — ABNORMAL LOW (ref >60.00)
Glucose, Bld: 97 mg/dL (ref 70–99)
Potassium: 4.5 mEq/L (ref 3.5–5.1)
Sodium: 142 mEq/L (ref 135–145)

## 2012-11-28 LAB — HEMOGLOBIN A1C: Hgb A1c MFr Bld: 5.2 % (ref 4.6–6.5)

## 2012-11-28 LAB — FOLATE: Folate: 3.6 ng/mL — ABNORMAL LOW (ref >5.9)

## 2012-11-28 LAB — VITAMIN B12: Vitamin B-12: 240 pg/mL (ref 211–911)

## 2012-11-28 LAB — IBC PANEL
Iron: 72 ug/dL (ref 42–145)
Saturation Ratios: 27.2 % (ref 20.0–50.0)
Transferrin: 189.1 mg/dL — ABNORMAL LOW (ref 212.0–360.0)

## 2012-11-28 LAB — LDL CHOLESTEROL, DIRECT: Direct LDL: 161.6 mg/dL

## 2012-11-28 LAB — FERRITIN: Ferritin: 452.4 ng/mL — ABNORMAL HIGH (ref 10.0–291.0)

## 2012-11-28 LAB — CORTISOL: Cortisol, Plasma: 8 ug/dL

## 2012-11-28 LAB — MAGNESIUM: Magnesium: 1.8 mg/dL (ref 1.5–2.5)

## 2012-11-28 MED ORDER — NEBIVOLOL HCL 10 MG PO TABS: 10.0000 mg | ORAL_TABLET | Freq: Every evening | ORAL | Status: DC

## 2012-11-28 MED ORDER — FOLIC ACID 1 MG PO TABS: 1.0000 mg | ORAL_TABLET | Freq: Every day | ORAL | Status: DC

## 2012-11-28 MED ORDER — OLMESARTAN MEDOXOMIL-HCTZ 40-12.5 MG PO TABS: 1.0000 | ORAL_TABLET | Freq: Every day | ORAL | Status: DC

## 2012-11-28 NOTE — Addendum Note (Signed)
Addended by: [name] on: 11/28/2012 04:57 PM   Modules accepted: Orders

## 2012-11-28 NOTE — Assessment & Plan Note (Signed)
I will recheck her CBC and will look at her vitamin levels as well

## 2012-11-28 NOTE — Assessment & Plan Note (Signed)
I will recheck her K+ level, will also check her Mg++ and cortisol levels to see if they are causative factors as well as the GI issues

## 2012-11-28 NOTE — Progress Notes (Signed)
Subjective:    Patient ID: Brenda Davis, female    DOB: [birth year], 47 y.o.   MRN: [id number]  Hypertension This is a chronic problem. The current episode started more than 1 year ago. The problem has been gradually worsening since onset. Pertinent negatives include no anxiety, blurred vision, chest pain, headaches, malaise/fatigue, neck pain, orthopnea, palpitations, peripheral edema, PND, shortness of breath or sweats. There are no associated agents to hypertension. Past treatments include beta blockers and direct vasodilators. The current treatment provides mild improvement. There are no compliance problems.  Hypertensive end-organ damage includes kidney disease. Identifiable causes of hypertension include chronic renal disease.      Review of Systems  Constitutional: Negative.  Negative for fever, chills, malaise/fatigue, diaphoresis, appetite change and fatigue.  HENT: Negative.   Eyes: Negative.  Negative for blurred vision.  Respiratory: Negative.  Negative for cough, choking, chest tightness, shortness of breath, wheezing and stridor.   Cardiovascular: Negative.  Negative for chest pain, palpitations, orthopnea, leg swelling and PND.  Gastrointestinal: Negative.  Negative for nausea, vomiting, abdominal pain, diarrhea, constipation and blood in stool.  Endocrine: Negative.  Negative for polydipsia, polyphagia and polyuria.  Genitourinary: Negative.  Negative for frequency, hematuria, flank pain, enuresis and difficulty urinating.  Musculoskeletal: Negative.  Negative for arthralgias, joint swelling, myalgias and neck pain.  Skin: Negative.   Allergic/Immunologic: Negative.   Neurological: Positive for numbness (numbness isolated to her right thigh). Negative for dizziness, tremors, seizures, syncope, facial asymmetry, speech difficulty, weakness, light-headedness and headaches.  Hematological: Negative.  Negative for adenopathy. Does not bruise/bleed easily.   Psychiatric/Behavioral: Negative.        Objective:   Physical Exam  Vitals reviewed. Constitutional: She is oriented to person, place, and time. She appears well-developed and well-nourished. No distress.  HENT:  Head: Normocephalic and atraumatic.  Mouth/Throat: Oropharynx is clear and moist. No oropharyngeal exudate.  Eyes: Conjunctivae are normal. Right eye exhibits no discharge. Left eye exhibits no discharge. No scleral icterus.  Neck: Normal range of motion. Neck supple. No JVD present. No tracheal deviation present. No thyromegaly present.  Cardiovascular: Normal rate, regular rhythm, normal heart sounds and intact distal pulses.  Exam reveals no gallop and no friction rub.   No murmur heard. Pulmonary/Chest: Effort normal and breath sounds normal. No stridor. No respiratory distress. She has no wheezes. She has no rales. She exhibits no tenderness.  Abdominal: Soft. Bowel sounds are normal. She exhibits no distension and no mass. There is no tenderness. There is no rebound and no guarding.  Musculoskeletal: Normal range of motion. She exhibits no edema and no tenderness.  Lymphadenopathy:    She has no cervical adenopathy.  Neurological: She is oriented to person, place, and time.  Skin: Skin is warm and dry. No rash noted. She is not diaphoretic. No erythema. No pallor.  Psychiatric: She has a normal mood and affect. Her behavior is normal. Judgment and thought content normal.     Lab Results  Component Value Date   WBC 4.9 09/27/2012   HGB 10.0* 09/27/2012   HCT 30.3* 09/27/2012   PLT 280 09/27/2012   GLUCOSE 81 09/27/2012   CHOL 217* 05/23/2012   TRIG 122.0 05/23/2012   HDL 36.60* 05/23/2012   LDLDIRECT 149.2 05/23/2012   LDLCALC 163* 03/21/2012   ALT 15 09/27/2012   AST 21 09/27/2012   NA 138 09/27/2012   K 3.4* 09/27/2012   CL 104 09/27/2012   CREATININE 2.08* 09/27/2012   BUN 17 09/27/2012  CO2 22 09/27/2012   TSH 3.136 03/21/2012   HGBA1C 7.2* 05/23/2012   MICROALBUR 159.00* 09/17/2009        Assessment & Plan:

## 2012-11-28 NOTE — Assessment & Plan Note (Signed)
I will check her labs to look for malabsorption and vitamin deficiencies

## 2012-11-28 NOTE — Assessment & Plan Note (Signed)
I will recheck her UA today 

## 2012-11-28 NOTE — Assessment & Plan Note (Signed)
Her BP is not well controlled I have asked her to stay on bystolic and to stop the hydralazine Also, I have asked her to start [facility]

## 2012-11-28 NOTE — Assessment & Plan Note (Signed)
I will check her A1C and will treat if needed

## 2012-11-28 NOTE — Assessment & Plan Note (Signed)
FLp today

## 2012-11-28 NOTE — Assessment & Plan Note (Signed)
She will cont to lose weight No treatment or diagnostic needed at this time

## 2012-11-28 NOTE — Patient Instructions (Signed)

## 2012-12-02 ENCOUNTER — Other Ambulatory Visit: Payer: Self-pay | Admitting: Internal Medicine

## 2012-12-02 ENCOUNTER — Encounter: Payer: Self-pay | Admitting: Internal Medicine

## 2012-12-02 DIAGNOSIS — E519 Thiamine deficiency, unspecified: Secondary | ICD-10-CM

## 2012-12-02 LAB — VITAMIN B1: Vitamin B1 (Thiamine): 7 nmol/L — ABNORMAL LOW (ref 8–30)

## 2012-12-02 MED ORDER — VITAMIN B-1 250 MG PO TABS: 250.0000 mg | ORAL_TABLET | Freq: Every day | ORAL | Status: DC

## 2012-12-16 ENCOUNTER — Encounter (HOSPITAL_COMMUNITY): Payer: Self-pay | Admitting: Emergency Medicine

## 2012-12-16 ENCOUNTER — Emergency Department (INDEPENDENT_AMBULATORY_CARE_PROVIDER_SITE_OTHER)
Admission: EM | Admit: 2012-12-16 | Discharge: 2012-12-16 | Disposition: A | Discharge status: Home / Self Care | Payer: BC Managed Care – PPO | Source: Home / Self Care

## 2012-12-16 DIAGNOSIS — J040 Acute laryngitis: Secondary | ICD-10-CM

## 2012-12-16 MED ORDER — PREDNISONE 20 MG PO TABS: 20.0000 mg | ORAL_TABLET | Freq: Two times a day (BID) | ORAL | Status: DC

## 2012-12-16 NOTE — ED Provider Notes (Signed)
Chief Complaint:   Chief Complaint  Patient presents with  . Sore Throat    History of Present Illness:   Brenda Davis is a 47 year old female who has had a two-day history of sore throat, hoarseness, complete loss of voice, chills, dry cough, nasal congestion with clear rhinorrhea, and diarrhea. She denies any wheezing, chest tightness, or chest pain. She's had no headache, sinus pain, or ear pain. No nausea, vomiting, or abdominal pain. She's had no sick exposures.  Review of Systems:  Other than noted above, the patient denies any of the following symptoms: Systemic:  No fevers, chills, sweats, weight loss or gain, fatigue, or tiredness. Eye:  No redness or discharge. ENT:  No ear pain, drainage, headache, nasal congestion, drainage, sinus pressure, difficulty swallowing, or sore throat. Neck:  No neck pain or swollen glands. Lungs:  No cough, sputum production, hemoptysis, wheezing, chest tightness, shortness of breath or chest pain. GI:  No abdominal pain, nausea, vomiting or diarrhea.  [place]:  Past medical history, family history, social history, meds, and allergies were reviewed. She is allergic to amlodipine, clonidine, and WelChol. 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 She has hypertension, history of gastric bypass, anemia, diabetes which now is controlled with diet, and chronic kidney disease which also has been controlled since her gastric bypass.  Physical Exam:   Vital signs:  BP 154/88  Pulse 78  Temp(Src) 98.6 F (37 C) (Oral)  Resp 16  SpO2 98% General:  Alert and oriented.  In no distress.  Skin warm and dry. Eye:  No conjunctival injection or drainage. Lids were normal. ENT:  TMs and canals were normal, without erythema or inflammation.  Nasal mucosa was clear and uncongested, without drainage.  Mucous membranes were moist.  Pharynx was clear with no exudate or drainage.  There were no oral ulcerations or lesions. Neck:  Supple, no adenopathy, tenderness or mass. Lungs:   No respiratory distress.  Lungs were clear to auscultation, without wheezes, rales or rhonchi.  Breath sounds were clear and equal bilaterally.  Heart:  Regular rhythm, without gallops, murmers or rubs. Skin:  Clear, warm, and dry, without rash or lesions.  Assessment:  The encounter diagnosis was Laryngitis.  Plan:   1.  Meds:  The following meds were prescribed:   Discharge Medication List as of 12/16/2012  3:38 PM    START taking these medications   Details  predniSONE (DELTASONE) 20 MG tablet Take 1 tablet (20 mg total) by mouth 2 (two) times daily., Starting 12/16/2012, Until Discontinued, Normal        2.  Patient Education/Counseling:  The patient was given appropriate handouts, self care instructions, and instructed in symptomatic relief.  Suggested voice rest and extra fluids.  3.  Follow up:  The patient was told to follow up if no better in 3 to 4 days, if becoming worse in any way, and given some red flag symptoms such as any difficulty breathing which would prompt immediate return.  Follow up here as necessary.      Harden Mo, MD 12/16/12 [phone]

## 2012-12-16 NOTE — ED Notes (Signed)
Pain since yesterday

## 2012-12-19 ENCOUNTER — Telehealth (INDEPENDENT_AMBULATORY_CARE_PROVIDER_SITE_OTHER): Payer: Self-pay

## 2012-12-19 ENCOUNTER — Telehealth: Payer: Self-pay | Admitting: Internal Medicine

## 2012-12-19 DIAGNOSIS — L659 Nonscarring hair loss, unspecified: Secondary | ICD-10-CM

## 2012-12-19 NOTE — Telephone Encounter (Signed)
Patient states she has been having hair loss asking what can be done. I advised her to call her PCP he need to see her for further recommendations/labs and I would inform DR.Lilyan Punt of her concerns .

## 2012-12-19 NOTE — Telephone Encounter (Signed)
Caller: Andi/Patient; Phone: [phone]; Reason for Call: Called to ask for referral to specialist or dermatologist for alopecia over past 2 weeks.  Declined triage.  Stated is unable to take time off to see PCP.  History of gastric sleeve 7/14 with hospitalization for vomiting.  No vomiting for last 7 weeks. Taking multivitamin, Biotin and Vit D.  Asking of should be taking iron.  Surgeon office referred to PCP for follow up.  Please call back.

## 2012-12-21 NOTE — Telephone Encounter (Signed)
Left detailed message on pts VM advised of message.

## 2012-12-21 NOTE — Telephone Encounter (Signed)
Referral sent Yes she can take iron

## 2012-12-21 NOTE — Telephone Encounter (Signed)
Pt called again requesting a return call in reference to previous message

## 2012-12-23 NOTE — Telephone Encounter (Signed)
Called patient and recommended patient to try taking Biotin (OTC Vitamin for hair)  Patient states she spoke to someone from our office which advised her to contact her PCP.  Patient has appointment with dermatologist.  Patient reports she's doing well and has lost 75 lbs to date.

## 2013-02-27 ENCOUNTER — Telehealth: Payer: Self-pay | Admitting: Internal Medicine

## 2013-02-27 DIAGNOSIS — E1129 Type 2 diabetes mellitus with other diabetic kidney complication: Secondary | ICD-10-CM

## 2013-02-27 DIAGNOSIS — R809 Proteinuria, unspecified: Secondary | ICD-10-CM

## 2013-02-27 DIAGNOSIS — E1165 Type 2 diabetes mellitus with hyperglycemia: Secondary | ICD-10-CM

## 2013-02-27 DIAGNOSIS — I10 Essential (primary) hypertension: Secondary | ICD-10-CM

## 2013-02-27 NOTE — Telephone Encounter (Signed)
Pt request sample for Benicar and Bystolic. Please call pt

## 2013-02-28 MED ORDER — NEBIVOLOL HCL 10 MG PO TABS: 10.0000 mg | ORAL_TABLET | Freq: Every evening | ORAL | Status: DC

## 2013-02-28 MED ORDER — OLMESARTAN MEDOXOMIL-HCTZ 40-12.5 MG PO TABS: 1.0000 | ORAL_TABLET | Freq: Every day | ORAL | Status: DC

## 2013-02-28 NOTE — Telephone Encounter (Signed)
LMOVM to pick up

## 2013-03-03 ENCOUNTER — Encounter: Payer: Self-pay | Admitting: Internal Medicine

## 2013-03-03 ENCOUNTER — Other Ambulatory Visit (INDEPENDENT_AMBULATORY_CARE_PROVIDER_SITE_OTHER): Payer: BC Managed Care – PPO

## 2013-03-03 ENCOUNTER — Ambulatory Visit (INDEPENDENT_AMBULATORY_CARE_PROVIDER_SITE_OTHER): Payer: BC Managed Care – PPO | Admitting: Internal Medicine

## 2013-03-03 VITALS — BP 160/100 | HR 55 | Temp 98.3°F | Resp 16 | Ht 64.0 in | Wt 245.0 lb

## 2013-03-03 DIAGNOSIS — D529 Folate deficiency anemia, unspecified: Secondary | ICD-10-CM

## 2013-03-03 DIAGNOSIS — Z9884 Bariatric surgery status: Secondary | ICD-10-CM

## 2013-03-03 DIAGNOSIS — E876 Hypokalemia: Secondary | ICD-10-CM

## 2013-03-03 DIAGNOSIS — D649 Anemia, unspecified: Secondary | ICD-10-CM

## 2013-03-03 DIAGNOSIS — E1129 Type 2 diabetes mellitus with other diabetic kidney complication: Secondary | ICD-10-CM

## 2013-03-03 DIAGNOSIS — N183 Chronic kidney disease, stage 3 unspecified: Secondary | ICD-10-CM

## 2013-03-03 DIAGNOSIS — E519 Thiamine deficiency, unspecified: Secondary | ICD-10-CM

## 2013-03-03 DIAGNOSIS — E785 Hyperlipidemia, unspecified: Secondary | ICD-10-CM

## 2013-03-03 DIAGNOSIS — I10 Essential (primary) hypertension: Secondary | ICD-10-CM

## 2013-03-03 DIAGNOSIS — E1165 Type 2 diabetes mellitus with hyperglycemia: Secondary | ICD-10-CM

## 2013-03-03 DIAGNOSIS — E559 Vitamin D deficiency, unspecified: Secondary | ICD-10-CM

## 2013-03-03 LAB — BASIC METABOLIC PANEL
BUN: 46 mg/dL — ABNORMAL HIGH (ref 6–23)
CO2: 24 mEq/L (ref 19–32)
Calcium: 9.4 mg/dL (ref 8.4–10.5)
Chloride: 107 mEq/L (ref 96–112)
Creatinine, Ser: 2.9 mg/dL — ABNORMAL HIGH (ref 0.4–1.2)
GFR: 22.08 mL/min — ABNORMAL LOW (ref >60.00)
Glucose, Bld: 88 mg/dL (ref 70–99)
Potassium: 4.8 mEq/L (ref 3.5–5.1)
Sodium: 140 mEq/L (ref 135–145)

## 2013-03-03 LAB — CBC WITH DIFFERENTIAL/PLATELET
Basophils Absolute: 0 10*3/uL (ref 0.0–0.1)
Basophils Relative: 0.7 % (ref 0.0–3.0)
Eosinophils Absolute: 0.2 10*3/uL (ref 0.0–0.7)
Eosinophils Relative: 2.6 % (ref 0.0–5.0)
HCT: 33.9 % — ABNORMAL LOW (ref 36.0–46.0)
Hemoglobin: 10.8 g/dL — ABNORMAL LOW (ref 12.0–15.0)
Lymphocytes Relative: 42 % (ref 12.0–46.0)
Lymphs Abs: 2.6 10*3/uL (ref 0.7–4.0)
MCHC: 31.8 g/dL (ref 30.0–36.0)
MCV: 91 fl (ref 78.0–100.0)
Monocytes Absolute: 0.6 10*3/uL (ref 0.1–1.0)
Monocytes Relative: 9.2 % (ref 3.0–12.0)
Neutro Abs: 2.8 10*3/uL (ref 1.4–7.7)
Neutrophils Relative %: 45.5 % (ref 43.0–77.0)
Platelets: 349 10*3/uL (ref 150.0–400.0)
RBC: 3.73 Mil/uL — ABNORMAL LOW (ref 3.87–5.11)
RDW: 15.2 % — ABNORMAL HIGH (ref 11.5–14.6)
WBC: 6.2 10*3/uL (ref 4.5–10.5)

## 2013-03-03 LAB — IBC PANEL
Iron: 41 ug/dL — ABNORMAL LOW (ref 42–145)
Saturation Ratios: 14.3 % — ABNORMAL LOW (ref 20.0–50.0)
Transferrin: 204.4 mg/dL — ABNORMAL LOW (ref 212.0–360.0)

## 2013-03-03 LAB — FERRITIN: Ferritin: 213.5 ng/mL (ref 10.0–291.0)

## 2013-03-03 LAB — VITAMIN B12: Vitamin B-12: 725 pg/mL (ref 211–911)

## 2013-03-03 LAB — TSH: TSH: 0.3 u[IU]/mL — ABNORMAL LOW (ref 0.35–5.50)

## 2013-03-03 LAB — FOLATE: Folate: 6.1 ng/mL (ref >5.9)

## 2013-03-03 LAB — HEMOGLOBIN A1C: Hgb A1c MFr Bld: 5.3 % (ref 4.6–6.5)

## 2013-03-03 MED ORDER — FERRALET 90 90-1 MG PO TABS: 1.0000 | ORAL_TABLET | Freq: Every day | ORAL | Status: DC

## 2013-03-03 MED ORDER — FOLIC ACID 1 MG PO TABS: 1.0000 mg | ORAL_TABLET | Freq: Every day | ORAL | Status: DC

## 2013-03-03 MED ORDER — VITAMIN B-1 250 MG PO TABS: 250.0000 mg | ORAL_TABLET | Freq: Every day | ORAL | Status: DC

## 2013-03-03 MED ORDER — OLMESARTAN MEDOXOMIL-HCTZ 40-25 MG PO TABS: 1.0000 | ORAL_TABLET | Freq: Every day | ORAL | Status: DC

## 2013-03-03 MED ORDER — NEBIVOLOL HCL 10 MG PO TABS: 20.0000 mg | ORAL_TABLET | Freq: Every day | ORAL | Status: DC

## 2013-03-03 NOTE — Progress Notes (Signed)
Pre visit review using our clinic review tool, if applicable. No additional management support is needed unless otherwise documented below in the visit note. 

## 2013-03-03 NOTE — Patient Instructions (Signed)

## 2013-03-03 NOTE — Assessment & Plan Note (Signed)
I will check her A1C and will treat if needed 

## 2013-03-03 NOTE — Assessment & Plan Note (Signed)
CBC and recheck the level today She has not been taking folate as requested

## 2013-03-03 NOTE — Assessment & Plan Note (Signed)
I will recheck her labs today to look for vit deficiencies and malabsorption

## 2013-03-03 NOTE — Progress Notes (Signed)
Subjective:    Patient ID: Brenda Davis, female    DOB: March 02, 1965, 48 y.o.   MRN: [id number]  Hypertension This is a chronic problem. The problem has been gradually worsening since onset. The problem is uncontrolled. Associated symptoms include headaches. Pertinent negatives include no anxiety, blurred vision, chest pain, malaise/fatigue, neck pain, orthopnea, palpitations, peripheral edema, PND, shortness of breath or sweats. Past treatments include angiotensin blockers, diuretics and beta blockers. The current treatment provides mild improvement. Compliance problems include exercise and diet.  Hypertensive end-organ damage includes kidney disease. Identifiable causes of hypertension include chronic renal disease.      Review of Systems  Constitutional: Negative for fever, chills, malaise/fatigue, diaphoresis, appetite change and fatigue.  Eyes: Negative.  Negative for blurred vision.  Respiratory: Negative.  Negative for cough, choking, chest tightness, shortness of breath, wheezing and stridor.   Cardiovascular: Negative.  Negative for chest pain, palpitations, orthopnea, leg swelling and PND.  Gastrointestinal: Negative.  Negative for nausea, vomiting, abdominal pain, diarrhea, constipation and blood in stool.  Endocrine: Negative.   Genitourinary: Negative.   Musculoskeletal: Negative.  Negative for arthralgias, back pain, gait problem, joint swelling, myalgias, neck pain and neck stiffness.  Skin: Negative.   Allergic/Immunologic: Negative.   Neurological: Positive for headaches. Negative for dizziness, tremors, seizures, syncope, facial asymmetry, speech difficulty, weakness and numbness.  Hematological: Negative.  Negative for adenopathy. Does not bruise/bleed easily.  Psychiatric/Behavioral: Negative.        Objective:   Physical Exam  Vitals reviewed. Constitutional: She is oriented to person, place, and time. She appears well-developed and well-nourished. No distress.   HENT:  Head: Normocephalic and atraumatic.  Mouth/Throat: Oropharynx is clear and moist. Mucous membranes are pale, not dry and not cyanotic. No oropharyngeal exudate, posterior oropharyngeal edema, posterior oropharyngeal erythema or tonsillar abscesses.  Eyes: Conjunctivae are normal. Right eye exhibits no discharge. Left eye exhibits no discharge. No scleral icterus.  Neck: Normal range of motion. Neck supple. No JVD present. No tracheal deviation present. No thyromegaly present.  Cardiovascular: Normal rate, regular rhythm and normal heart sounds.  Exam reveals no gallop and no friction rub.   No murmur heard. Pulmonary/Chest: Effort normal and breath sounds normal. No stridor. No respiratory distress. She has no wheezes. She has no rales. She exhibits no tenderness.  Abdominal: Soft. Bowel sounds are normal. She exhibits no distension and no mass. There is no tenderness. There is no rebound and no guarding.  Musculoskeletal: Normal range of motion. She exhibits no edema and no tenderness.  Lymphadenopathy:    She has no cervical adenopathy.  Neurological: She is oriented to person, place, and time.  Skin: Skin is warm and dry. No rash noted. She is not diaphoretic. No erythema. No pallor.  Psychiatric: She has a normal mood and affect. Her behavior is normal. Judgment and thought content normal.     Lab Results  Component Value Date   WBC 4.3* 11/28/2012   HGB 10.0* 11/28/2012   HCT 30.0* 11/28/2012   PLT 414.0* 11/28/2012   GLUCOSE 97 11/28/2012   CHOL 239* 11/28/2012   TRIG 110.0 11/28/2012   HDL 50.60 11/28/2012   LDLDIRECT 161.6 11/28/2012   LDLCALC 163* 03/21/2012   ALT 15 09/27/2012   AST 21 09/27/2012   NA 142 11/28/2012   K 4.5 11/28/2012   CL 108 11/28/2012   CREATININE 2.3* 11/28/2012   BUN 33* 11/28/2012   CO2 26 11/28/2012   TSH 3.136 03/21/2012   HGBA1C 5.2 11/28/2012  MICROALBUR 159.00* 09/17/2009       Assessment & Plan:

## 2013-03-03 NOTE — Assessment & Plan Note (Signed)
I will recheck her level today

## 2013-03-03 NOTE — Assessment & Plan Note (Signed)
Her BP is not well controlled I will recheck her labs today to look for end organ damage and secondary causes of HTN Will increase the doses of bystolic and benicar-hct to get better control as well

## 2013-03-03 NOTE — Assessment & Plan Note (Signed)
I will recheck her CBC and vitamin levels

## 2013-03-04 ENCOUNTER — Encounter: Payer: Self-pay | Admitting: Internal Medicine

## 2013-03-04 LAB — VITAMIN D 25 HYDROXY (VIT D DEFICIENCY, FRACTURES): Vit D, 25-Hydroxy: 27 ng/mL — ABNORMAL LOW (ref 30–89)

## 2013-03-04 MED ORDER — CHOLECALCIFEROL 1.25 MG (50000 UT) PO TABS: 1.0000 | ORAL_TABLET | ORAL | Status: DC

## 2013-03-04 NOTE — Addendum Note (Signed)
Addended by: [name] on: 03/04/2013 09:02 AM   Modules accepted: Orders

## 2013-03-08 LAB — VITAMIN B1: Vitamin B1 (Thiamine): 12 nmol/L (ref 8–30)

## 2013-03-08 LAB — VITAMIN B6: Vitamin B6: 5.7 ng/mL (ref 2.1–21.7)

## 2013-03-09 ENCOUNTER — Telehealth: Payer: Self-pay

## 2013-03-09 NOTE — Telephone Encounter (Signed)
Relevant patient education assigned to patient using Emmi. ° °

## 2013-07-20 ENCOUNTER — Telehealth: Payer: Self-pay | Admitting: *Deleted

## 2013-07-20 DIAGNOSIS — I10 Essential (primary) hypertension: Secondary | ICD-10-CM

## 2013-07-20 MED ORDER — NEBIVOLOL HCL 10 MG PO TABS: 20.0000 mg | ORAL_TABLET | Freq: Every day | ORAL | Status: DC

## 2013-07-20 NOTE — Telephone Encounter (Signed)
Pt called requesting samples on benicar & bystolic inform pt of the new sample policy will leave benicar for pick-up. Requesting rx for the bystolic since md gave her samples and [facility]...Johny Chess

## 2013-07-25 ENCOUNTER — Telehealth: Payer: Self-pay | Admitting: *Deleted

## 2013-07-25 NOTE — Telephone Encounter (Signed)
Left message on machine for patient to return our call to schedule an appointment to follow up with her blood pressure Message sent to mychart Diabetic bundle

## 2013-07-29 ENCOUNTER — Emergency Department (HOSPITAL_COMMUNITY)
Admission: EM | Admit: 2013-07-29 | Discharge: 2013-07-29 | Disposition: A | Discharge status: Home / Self Care | Payer: BC Managed Care – PPO | Attending: Emergency Medicine | Admitting: Emergency Medicine

## 2013-07-29 ENCOUNTER — Encounter (HOSPITAL_COMMUNITY): Payer: Self-pay | Admitting: Emergency Medicine

## 2013-07-29 DIAGNOSIS — M109 Gout, unspecified: Secondary | ICD-10-CM

## 2013-07-29 DIAGNOSIS — E119 Type 2 diabetes mellitus without complications: Secondary | ICD-10-CM | POA: Insufficient documentation

## 2013-07-29 DIAGNOSIS — I129 Hypertensive chronic kidney disease with stage 1 through stage 4 chronic kidney disease, or unspecified chronic kidney disease: Secondary | ICD-10-CM | POA: Insufficient documentation

## 2013-07-29 DIAGNOSIS — N183 Chronic kidney disease, stage 3 unspecified: Secondary | ICD-10-CM | POA: Insufficient documentation

## 2013-07-29 DIAGNOSIS — IMO0002 Reserved for concepts with insufficient information to code with codable children: Secondary | ICD-10-CM | POA: Insufficient documentation

## 2013-07-29 DIAGNOSIS — Z79899 Other long term (current) drug therapy: Secondary | ICD-10-CM | POA: Insufficient documentation

## 2013-07-29 DIAGNOSIS — Z87891 Personal history of nicotine dependence: Secondary | ICD-10-CM | POA: Insufficient documentation

## 2013-07-29 MED ORDER — COLCHICINE 0.6 MG PO TABS
0.6000 mg | ORAL_TABLET | Freq: Once | ORAL | Status: AC
  Administered 2013-07-29: 0.6 mg via ORAL
  Filled 2013-07-29: qty 1

## 2013-07-29 MED ORDER — COLCHICINE 0.6 MG PO TABS: 0.6000 mg | ORAL_TABLET | Freq: Every day | ORAL | Status: DC

## 2013-07-29 MED ORDER — HYDROCODONE-ACETAMINOPHEN 5-325 MG PO TABS: 2.0000 | ORAL_TABLET | ORAL | Status: DC | PRN

## 2013-07-29 MED ORDER — HYDROCODONE-ACETAMINOPHEN 5-325 MG PO TABS
1.0000 | ORAL_TABLET | Freq: Once | ORAL | Status: AC
  Administered 2013-07-29: 1 via ORAL
  Filled 2013-07-29: qty 1

## 2013-07-29 MED ORDER — PREDNISONE 20 MG PO TABS
60.0000 mg | ORAL_TABLET | Freq: Once | ORAL | Status: AC
  Administered 2013-07-29: 60 mg via ORAL
  Filled 2013-07-29: qty 3

## 2013-07-29 MED ORDER — PREDNISONE 10 MG PO TABS: 20.0000 mg | ORAL_TABLET | Freq: Every day | ORAL | Status: DC

## 2013-07-29 NOTE — ED Notes (Signed)
Pt c/o pain in rt foot since Wednesday.  Hx of gout.  Effusion noted.

## 2013-07-29 NOTE — ED Provider Notes (Signed)
CSN: [id number]     Arrival date & time 07/29/13  0919 History   First MD Initiated Contact with Patient 07/29/13 0935     Chief Complaint  Patient presents with  . Foot Pain      HPI  Patient presents with pain in her right ankle. History of gout. Started having pain yesterday while at work. She does customer service. She is on on and off her feet a lot. By the end of the day last night she said she could not walk on her right ankle without marked limp. Pain is morning with swelling she presents here. Exactly similar previous episodes of right ankle gout.  Past Medical History  Diagnosis Date  . Hypertension   . Diabetes mellitus   . Gout   . Depression   . Gout   . Hyperlipidemia   . Blood transfusion without reported diagnosis   . Morbid obesity   . Chronic kidney disease     STAGE 3 CHRONIC KIDNEY DISEASE SECONDARY TO DIABETIC GLOMERULOSCLEROSIS AND UNCONTROLLED HYPERTENSION - PER OFFICE NOTES DR. Florene Glen [facility].  Marland Kitchen Headache(784.0)     CHRONIC H/A'S  . Pain     LEFT SHOULDER PAIN - WAS SEEN AT AN URGENT CARE - GIVEN SLING FOR COMFORT AND TOLD ROM AS TOLERATED.   Past Surgical History  Procedure Laterality Date  . Cesarean section  1988, 1993  . Carpal tunnel release  2008  . Laparoscopic gastric sleeve resection N/A 07/19/2012    Procedure: LAPAROSCOPIC SLEEVE GASTRECTOMY with EGD;  Surgeon: Madilyn Hook, DO;  Location: [facility];  Service: General;  Laterality: N/A;  laparoscopic sleeve gastrectomy with EGD  . Upper gi endoscopy N/A 07/19/2012    Procedure: UPPER GI ENDOSCOPY;  Surgeon: Madilyn Hook, DO;  Location: [facility];  Service: General;  Laterality: N/A;  . Esophagogastroduodenoscopy N/A 09/04/2012    Procedure: ESOPHAGOGASTRODUODENOSCOPY (EGD);  Surgeon: Shann Medal, MD;  Location: [facility];  Service: General;  Laterality: N/A;  PF  . Esophagogastroduodenoscopy (egd) with esophageal dilation N/A 09/29/2012    Procedure: ESOPHAGOGASTRODUODENOSCOPY (EGD)  WITH ESOPHAGEAL DILATION;  Surgeon: Milus Banister, MD;  Location: [facility];  Service: Endoscopy;  Laterality: N/A;   Family History  Problem Relation Age of Onset  . Hypertension Mother   . Diabetes Father   . Arthritis Other   . Hyperlipidemia Other   . Cancer Neg Hx   . Heart disease Neg Hx   . Kidney disease Neg Hx   . Stroke Neg Hx    History  Substance Use Topics  . Smoking status: Former Smoker -- 20 years    Types: Cigarettes  . Smokeless tobacco: Never Used     Comment: QUIT SMOKING ABOUT 2009  . Alcohol Use: No   OB History   Grav Para Term Preterm Abortions TAB SAB Ect Mult Living                 Review of Systems  Constitutional: Negative for fever, chills, diaphoresis, appetite change and fatigue.  HENT: Negative for mouth sores, sore throat and trouble swallowing.   Eyes: Negative for visual disturbance.  Respiratory: Negative for cough, chest tightness, shortness of breath and wheezing.   Cardiovascular: Negative for chest pain.  Gastrointestinal: Negative for nausea, vomiting, abdominal pain, diarrhea and abdominal distention.  Endocrine: Negative for polydipsia, polyphagia and polyuria.  Genitourinary: Negative for dysuria, frequency and hematuria.  Musculoskeletal: Positive for arthralgias, gait problem and joint swelling.  Skin: Negative  for color change, pallor and rash.  Neurological: Negative for dizziness, syncope, light-headedness and headaches.  Hematological: Does not bruise/bleed easily.  Psychiatric/Behavioral: Negative for behavioral problems and confusion.      Allergies  Amlodipine; Clonidine derivatives; and Welchol  Home Medications   Prior to Admission medications   Medication Sig Start Date End Date Taking? Authorizing Provider  nebivolol (BYSTOLIC) 10 MG tablet Take 2 tablets (20 mg total) by mouth daily. 07/20/13  Yes [name], MD  olmesartan-hydrochlorothiazide (BENICAR HCT) 40-25 MG per tablet Take 1 tablet by mouth  daily. 03/03/13  Yes [name], MD  colchicine 0.6 MG tablet Take 1 tablet (0.6 mg total) by mouth daily. 07/29/13   Tanna Furry, MD  HYDROcodone-acetaminophen (NORCO/VICODIN) 5-325 MG per tablet Take 2 tablets by mouth every 4 (four) hours as needed. 07/29/13   Tanna Furry, MD  predniSONE (DELTASONE) 10 MG tablet Take 2 tablets (20 mg total) by mouth daily. 07/29/13   Tanna Furry, MD   BP 191/100  Pulse 76  Temp(Src) 98 F (36.7 C) (Oral)  Resp 18  SpO2 100% Physical Exam  Constitutional: She is oriented to person, place, and time. She appears well-developed and well-nourished. No distress.  HENT:  Head: Normocephalic.  Eyes: Conjunctivae are normal. Pupils are equal, round, and reactive to light. No scleral icterus.  Neck: Normal range of motion. Neck supple. No thyromegaly present.  Cardiovascular: Normal rate and regular rhythm.  Exam reveals no gallop and no friction rub.   No murmur heard. Pulmonary/Chest: Effort normal and breath sounds normal. No respiratory distress. She has no wheezes. She has no rales.  Abdominal: Soft. Bowel sounds are normal. She exhibits no distension. There is no tenderness. There is no rebound.  Musculoskeletal: Normal range of motion.       Feet:  No tophi over the skin the extremities or joints.  Neurological: She is alert and oriented to person, place, and time.  Skin: Skin is warm and dry. No rash noted.  Psychiatric: She has a normal mood and affect. Her behavior is normal.    ED Course  Procedures (including critical care time) Labs Review Labs Reviewed - No data to display  Imaging Review No results found.   EKG Interpretation None      MDM   Final diagnoses:  Acute gout of right ankle, unspecified cause    History and exam are suggestive of right ankle effusion secondary to acute gouty arthritis. Plan his colchicine, prednisone, Vicodin. Avoid anti-inflammatories secondary to renal insufficiency.    Tanna Furry, MD 07/29/13  1010

## 2013-08-28 ENCOUNTER — Other Ambulatory Visit: Payer: Self-pay | Admitting: Internal Medicine

## 2013-08-31 ENCOUNTER — Telehealth: Payer: Self-pay | Admitting: Internal Medicine

## 2013-08-31 NOTE — Telephone Encounter (Signed)
Left vm for patient to call back to schedule cpe.  Did not see last cpe in chart.

## 2013-12-22 ENCOUNTER — Telehealth: Payer: Self-pay | Admitting: Internal Medicine

## 2013-12-22 DIAGNOSIS — I10 Essential (primary) hypertension: Secondary | ICD-10-CM

## 2013-12-22 MED ORDER — OLMESARTAN MEDOXOMIL-HCTZ 40-25 MG PO TABS: 1.0000 | ORAL_TABLET | Freq: Every day | ORAL | Status: DC

## 2013-12-22 MED ORDER — NEBIVOLOL HCL 10 MG PO TABS: 20.0000 mg | ORAL_TABLET | Freq: Every day | ORAL | Status: DC

## 2013-12-22 NOTE — Telephone Encounter (Signed)
Pt states these two medications; Benicar and Bystolic-  her pharmacy will only do a 90 day supply (this is the only way her insurance will cover these particular medications). [facility]. She has been out of Benicar for two days and Bystolic for two weeks.

## 2013-12-25 ENCOUNTER — Telehealth: Payer: Self-pay | Admitting: Internal Medicine

## 2013-12-25 NOTE — Telephone Encounter (Signed)
emmi emailed °

## 2014-02-19 ENCOUNTER — Emergency Department (INDEPENDENT_AMBULATORY_CARE_PROVIDER_SITE_OTHER)
Admission: EM | Admit: 2014-02-19 | Discharge: 2014-02-19 | Disposition: A | Discharge status: Home / Self Care | Payer: 59 | Source: Home / Self Care | Attending: Family Medicine | Admitting: Family Medicine

## 2014-02-19 ENCOUNTER — Encounter (HOSPITAL_COMMUNITY): Payer: Self-pay

## 2014-02-19 DIAGNOSIS — M1000 Idiopathic gout, unspecified site: Secondary | ICD-10-CM

## 2014-02-19 MED ORDER — IBUPROFEN 800 MG PO TABS: 800.0000 mg | ORAL_TABLET | Freq: Three times a day (TID) | ORAL | Status: DC | PRN

## 2014-02-19 MED ORDER — COLCHICINE 0.6 MG PO TABS: 0.6000 mg | ORAL_TABLET | Freq: Two times a day (BID) | ORAL | Status: DC

## 2014-02-19 MED ORDER — HYDROCODONE-ACETAMINOPHEN 5-325 MG PO TABS: 2.0000 | ORAL_TABLET | ORAL | Status: DC | PRN

## 2014-02-19 NOTE — ED Provider Notes (Signed)
CSN: [id number]     Arrival date & time 02/19/14  1505 History   First MD Initiated Contact with Patient 02/19/14 1620     Chief Complaint  Patient presents with  . Gout   (Consider location/radiation/quality/duration/timing/severity/associated sxs/prior Treatment) HPI         49 year old female with history of gout presents complaining of a gout flare. She has pain in her bilateral first MTPs for 3 weeks. It started in the right foot. The right is starting to get better but now her left foot is flaring. She has pain in the first MTP and she is unable to move her great toe wound. Her skin is very sensitive, red, and swollen. She denies any systemic symptoms. This is identical to previous gout flares. She has taken 2 tablets of colchicine as well as Tylenol with minimal relief   Past Medical History  Diagnosis Date  . Hypertension   . Diabetes mellitus   . Gout   . Depression   . Gout   . Hyperlipidemia   . Blood transfusion without reported diagnosis   . Morbid obesity   . Chronic kidney disease     STAGE 3 CHRONIC KIDNEY DISEASE SECONDARY TO DIABETIC GLOMERULOSCLEROSIS AND UNCONTROLLED HYPERTENSION - PER OFFICE NOTES DR. Florene Glen [facility].  Marland Kitchen Headache(784.0)     CHRONIC H/A'S  . Pain     LEFT SHOULDER PAIN - WAS SEEN AT AN URGENT CARE - GIVEN SLING FOR COMFORT AND TOLD ROM AS TOLERATED.   Past Surgical History  Procedure Laterality Date  . Cesarean section  1988, 1993  . Carpal tunnel release  2008  . Laparoscopic gastric sleeve resection N/A 07/19/2012    Procedure: LAPAROSCOPIC SLEEVE GASTRECTOMY with EGD;  Surgeon: Madilyn Hook, DO;  Location: [facility];  Service: General;  Laterality: N/A;  laparoscopic sleeve gastrectomy with EGD  . Upper gi endoscopy N/A 07/19/2012    Procedure: UPPER GI ENDOSCOPY;  Surgeon: Madilyn Hook, DO;  Location: [facility];  Service: General;  Laterality: N/A;  . Esophagogastroduodenoscopy N/A 09/04/2012    Procedure: ESOPHAGOGASTRODUODENOSCOPY  (EGD);  Surgeon: Shann Medal, MD;  Location: [facility];  Service: General;  Laterality: N/A;  PF  . Esophagogastroduodenoscopy (egd) with esophageal dilation N/A 09/29/2012    Procedure: ESOPHAGOGASTRODUODENOSCOPY (EGD) WITH ESOPHAGEAL DILATION;  Surgeon: Milus Banister, MD;  Location: [facility];  Service: Endoscopy;  Laterality: N/A;   Family History  Problem Relation Age of Onset  . Hypertension Mother   . Diabetes Father   . Arthritis Other   . Hyperlipidemia Other   . Cancer Neg Hx   . Heart disease Neg Hx   . Kidney disease Neg Hx   . Stroke Neg Hx    History  Substance Use Topics  . Smoking status: Former Smoker -- 20 years    Types: Cigarettes  . Smokeless tobacco: Never Used     Comment: QUIT SMOKING ABOUT 2009  . Alcohol Use: No   OB History    No data available     Review of Systems  Constitutional: Negative for fever and chills.  Musculoskeletal: Positive for arthralgias (see HPI).  All other systems reviewed and are negative.   Allergies  Amlodipine; Clonidine derivatives; and Welchol  Home Medications   Prior to Admission medications   Medication Sig Start Date End Date Taking? Authorizing Provider  colchicine 0.6 MG tablet Take 1 tablet (0.6 mg total) by mouth daily. 07/29/13   Tanna Furry, MD  colchicine 0.6  MG tablet Take 1 tablet (0.6 mg total) by mouth 2 (two) times daily. 02/19/14   Liam Graham, PA-C  HYDROcodone-acetaminophen (NORCO/VICODIN) 5-325 MG per tablet Take 2 tablets by mouth every 4 (four) hours as needed. 02/19/14   Liam Graham, PA-C  nebivolol (BYSTOLIC) 10 MG tablet Take 2 tablets (20 mg total) by mouth daily. 12/22/13   Biagio Borg, MD  olmesartan-hydrochlorothiazide (BENICAR HCT) 40-25 MG per tablet Take 1 tablet by mouth daily. 03/03/13   [name], MD  olmesartan-hydrochlorothiazide (BENICAR HCT) 40-25 MG per tablet Take 1 tablet by mouth daily. 12/22/13   Biagio Borg, MD  predniSONE (DELTASONE) 10 MG tablet Take 2  tablets (20 mg total) by mouth daily. 07/29/13   Tanna Furry, MD   BP 157/83 mmHg  Pulse 66  Temp(Src) 98.6 F (37 C) (Oral)  Resp 16  SpO2 96% Physical Exam  Constitutional: She is oriented to person, place, and time. Vital signs are normal. She appears well-developed and well-nourished. No distress.  HENT:  Head: Normocephalic and atraumatic.  Cardiovascular:  Pulses:      Dorsalis pedis pulses are 2+ on the right side, and 2+ on the left side.  Pulmonary/Chest: Effort normal. No respiratory distress.  Musculoskeletal:  Bilateral first MTP's are erythematous, decreased range of motion, with swelling, exquisitely tender  Neurological: She is alert and oriented to person, place, and time. She has normal strength. Coordination normal.  Skin: Skin is warm and dry. No rash noted. She is not diaphoretic.  Psychiatric: She has a normal mood and affect. Judgment normal.  Nursing note and vitals reviewed.   ED Course  Procedures (including critical care time) Labs Review Labs Reviewed - No data to display  Imaging Review No results found.   MDM   1. Acute idiopathic gout, unspecified site    Bilateral podagra. Treat with colchicine twice a day and norco when necessary. Avoid NSAIDs due to CKD.  She has some Norco that she may take for severe pain. Follow-up with her doctor within a month to be considered for preventative medication as she has had 7-8 gout flare is already in her life  Meds ordered this encounter  Medications  . DISCONTD: colchicine 0.6 MG tablet    Sig: Take 1 tablet (0.6 mg total) by mouth 2 (two) times daily.    Dispense:  20 tablet    Refill:  1  . DISCONTD: ibuprofen (ADVIL,MOTRIN) 800 MG tablet    Sig: Take 1 tablet (800 mg total) by mouth every 8 (eight) hours as needed.    Dispense:  30 tablet    Refill:  0  . DISCONTD: HYDROcodone-acetaminophen (NORCO/VICODIN) 5-325 MG per tablet    Sig: Take 2 tablets by mouth every 4 (four) hours as needed.     Dispense:  10 tablet    Refill:  0  . colchicine 0.6 MG tablet    Sig: Take 1 tablet (0.6 mg total) by mouth 2 (two) times daily.    Dispense:  20 tablet    Refill:  1  . HYDROcodone-acetaminophen (NORCO/VICODIN) 5-325 MG per tablet    Sig: Take 2 tablets by mouth every 4 (four) hours as needed.    Dispense:  10 tablet    Refill:  [place] Colie Fugitt, PA-C 02/19/14 1649

## 2014-02-19 NOTE — Discharge Instructions (Signed)
Gout Gout is an inflammatory arthritis caused by a buildup of uric acid crystals in the joints. Uric acid is a chemical that is normally present in the blood. When the level of uric acid in the blood is too high it can form crystals that deposit in your joints and tissues. This causes joint redness, soreness, and swelling (inflammation). Repeat attacks are common. Over time, uric acid crystals can form into masses (tophi) near a joint, destroying bone and causing disfigurement. Gout is treatable and often preventable. CAUSES  The disease begins with elevated levels of uric acid in the blood. Uric acid is produced by your body when it breaks down a naturally found substance called purines. Certain foods you eat, such as meats and fish, contain high amounts of purines. Causes of an elevated uric acid level include:  Being passed down from parent to child (heredity).  Diseases that cause increased uric acid production (such as obesity, psoriasis, and certain cancers).  Excessive alcohol use.  Diet, especially diets rich in meat and seafood.  Medicines, including certain cancer-fighting medicines (chemotherapy), water pills (diuretics), and aspirin.  Chronic kidney disease. The kidneys are no longer able to remove uric acid well.  Problems with metabolism. Conditions strongly associated with gout include:  Obesity.  High blood pressure.  High cholesterol.  Diabetes. Not everyone with elevated uric acid levels gets gout. It is not understood why some people get gout and others do not. Surgery, joint injury, and eating too much of certain foods are some of the factors that can lead to gout attacks. SYMPTOMS   An attack of gout comes on quickly. It causes intense pain with redness, swelling, and warmth in a joint.  Fever can occur.  Often, only one joint is involved. Certain joints are more commonly involved:  Base of the big toe.  Knee.  Ankle.  Wrist.  Finger. Without  treatment, an attack usually goes away in a few days to weeks. Between attacks, you usually will not have symptoms, which is different from many other forms of arthritis. DIAGNOSIS  Your caregiver will suspect gout based on your symptoms and exam. In some cases, tests may be recommended. The tests may include:  Blood tests.  Urine tests.  X-rays.  Joint fluid exam. This exam requires a needle to remove fluid from the joint (arthrocentesis). Using a microscope, gout is confirmed when uric acid crystals are seen in the joint fluid. TREATMENT  There are two phases to gout treatment: treating the sudden onset (acute) attack and preventing attacks (prophylaxis).  Treatment of an Acute Attack.  Medicines are used. These include anti-inflammatory medicines or steroid medicines.  An injection of steroid medicine into the affected joint is sometimes necessary.  The painful joint is rested. Movement can worsen the arthritis.  You may use warm or cold treatments on painful joints, depending which works best for you.  Treatment to Prevent Attacks.  If you suffer from frequent gout attacks, your caregiver may advise preventive medicine. These medicines are started after the acute attack subsides. These medicines either help your kidneys eliminate uric acid from your body or decrease your uric acid production. You may need to stay on these medicines for a very long time.  The early phase of treatment with preventive medicine can be associated with an increase in acute gout attacks. For this reason, during the first few months of treatment, your caregiver may also advise you to take medicines usually used for acute gout treatment. Be sure you   understand your caregiver's directions. Your caregiver may make several adjustments to your medicine dose before these medicines are effective.  Discuss dietary treatment with your caregiver or dietitian. Alcohol and drinks high in sugar and fructose and foods  such as meat, poultry, and seafood can increase uric acid levels. Your caregiver or dietitian can advise you on drinks and foods that should be limited. HOME CARE INSTRUCTIONS   Do not take aspirin to relieve pain. This raises uric acid levels.  Only take over-the-counter or prescription medicines for pain, discomfort, or fever as directed by your caregiver.  Rest the joint as much as possible. When in bed, keep sheets and blankets off painful areas.  Keep the affected joint raised (elevated).  Apply warm or cold treatments to painful joints. Use of warm or cold treatments depends on which works best for you.  Use crutches if the painful joint is in your leg.  Drink enough fluids to keep your urine clear or pale yellow. This helps your body get rid of uric acid. Limit alcohol, sugary drinks, and fructose drinks.  Follow your dietary instructions. Pay careful attention to the amount of protein you eat. Your daily diet should emphasize fruits, vegetables, whole grains, and fat-free or low-fat milk products. Discuss the use of coffee, vitamin C, and cherries with your caregiver or dietitian. These may be helpful in lowering uric acid levels.  Maintain a healthy body weight. SEEK MEDICAL CARE IF:   You develop diarrhea, vomiting, or any side effects from medicines.  You do not feel better in 24 hours, or you are getting worse. SEEK IMMEDIATE MEDICAL CARE IF:   Your joint becomes suddenly more tender, and you have chills or a fever. MAKE SURE YOU:   Understand these instructions.  Will watch your condition.  Will get help right away if you are not doing well or get worse. Document Released: 01/03/2000 Document Revised: 05/22/2013 Document Reviewed: 08/19/2011 [employer] Patient Information 2015 [employer]. This information is not intended to replace advice given to you by your health care provider. Make sure you discuss any questions you have with your health care  provider. Low-Purine Diet Purines are compounds that affect the level of uric acid in your body. A low-purine diet is a diet that is low in purines. Eating a low-purine diet can prevent the level of uric acid in your body from getting too high and causing gout or kidney stones or both. WHAT DO I NEED TO KNOW ABOUT THIS DIET?  Choose low-purine foods. Examples of low-purine foods are listed in the next section.  Drink plenty of fluids, especially water. Fluids can help remove uric acid from your body. Try to drink 8-16 cups (1.9-3.8 L) a day.  Limit foods high in fat, especially saturated fat, as fat makes it harder for the body to get rid of uric acid. Foods high in saturated fat include pizza, cheese, ice cream, whole milk, fried foods, and gravies. Choose foods that are lower in fat and lean sources of protein. Use olive oil when cooking as it contains healthy fats that are not high in saturated fat.  Limit alcohol. Alcohol interferes with the elimination of uric acid from your body. If you are having a gout attack, avoid all alcohol.  Keep in mind that different people's bodies react differently to different foods. You will probably learn over time which foods do or do not affect you. If you discover that a food tends to cause your gout to flare up,   avoid eating that food. You can more freely enjoy foods that do not cause problems. If you have any questions about a food item, talk to your dietitian or health care provider. WHICH FOODS ARE LOW, MODERATE, AND HIGH IN PURINES? The following is a list of foods that are low, moderate, and high in purines. You can eat any amount of the foods that are low in purines. You may be able to have small amounts of foods that are moderate in purines. Ask your health care provider how much of a food moderate in purines you can have. Avoid foods high in purines. Grains  Foods low in purines: Enriched white bread, pasta, rice, cake, cornbread, popcorn.  Foods  moderate in purines: Whole-grain breads and cereals, wheat germ, bran, oatmeal. Uncooked oatmeal. Dry wheat bran or wheat germ.  Foods high in purines: Pancakes, French toast, biscuits, muffins. Vegetables  Foods low in purines: All vegetables, except those that are moderate in purines.  Foods moderate in purines: Asparagus, cauliflower, spinach, mushrooms, green peas. Fruits  All fruits are low in purines. Meats and other Protein Foods  Foods low in purines: Eggs, nuts, peanut butter.  Foods moderate in purines: 80-90% lean beef, lamb, veal, pork, poultry, fish, eggs, peanut butter, nuts. Crab, lobster, oysters, and shrimp. Cooked dried beans, peas, and lentils.  Foods high in purines: Anchovies, sardines, herring, mussels, tuna, codfish, scallops, trout, and haddock. Bacon. Organ meats (such as liver or kidney). Tripe. Game meat. Goose. Sweetbreads. Dairy  All dairy foods are low in purines. Low-fat and fat-free dairy products are best because they are low in saturated fat. Beverages  Drinks low in purines: Water, carbonated beverages, tea, coffee, cocoa.  Drinks moderate in purines: Soft drinks and other drinks sweetened with high-fructose corn syrup. Juices. To find whether a food or drink is sweetened with high-fructose corn syrup, look at the ingredients list.  Drinks high in purines: Alcoholic beverages (such as beer). Condiments  Foods low in purines: Salt, herbs, olives, pickles, relishes, vinegar.  Foods moderate in purines: Butter, margarine, oils, mayonnaise. Fats and Oils  Foods low in purines: All types, except gravies and sauces made with meat.  Foods high in purines: Gravies and sauces made with meat. Other Foods  Foods low in purines: Sugars, sweets, gelatin. Cake. Soups made without meat.  Foods moderate in purines: Meat-based or fish-based soups, broths, or bouillons. Foods and drinks sweetened with high-fructose corn syrup.  Foods high in purines:  High-fat desserts (such as ice cream, cookies, cakes, pies, doughnuts, and chocolate). Contact your dietitian for more information on foods that are not listed here. Document Released: 05/02/2010 Document Revised: 01/10/2013 Document Reviewed: 12/12/2012 [employer] Patient Information 2015 [employer]. This information is not intended to replace advice given to you by your health care provider. Make sure you discuss any questions you have with your health care provider.  

## 2014-02-19 NOTE — ED Notes (Signed)
C/io pain both feet. Feels just like gout. Has not taken her medication for prevention, as she has not had pain since July

## 2014-03-12 ENCOUNTER — Emergency Department (INDEPENDENT_AMBULATORY_CARE_PROVIDER_SITE_OTHER)
Admission: EM | Admit: 2014-03-12 | Discharge: 2014-03-12 | Disposition: A | Discharge status: Home / Self Care | Payer: 59 | Source: Home / Self Care | Attending: Emergency Medicine | Admitting: Emergency Medicine

## 2014-03-12 ENCOUNTER — Encounter (HOSPITAL_COMMUNITY): Payer: Self-pay

## 2014-03-12 DIAGNOSIS — M109 Gout, unspecified: Secondary | ICD-10-CM

## 2014-03-12 DIAGNOSIS — M1000 Idiopathic gout, unspecified site: Secondary | ICD-10-CM

## 2014-03-12 MED ORDER — PREDNISONE 10 MG PO TABS: ORAL_TABLET | ORAL | Status: DC

## 2014-03-12 NOTE — Discharge Instructions (Signed)
Gout Gout is an inflammatory arthritis caused by a buildup of uric acid crystals in the joints. Uric acid is a chemical that is normally present in the blood. When the level of uric acid in the blood is too high it can form crystals that deposit in your joints and tissues. This causes joint redness, soreness, and swelling (inflammation). Repeat attacks are common. Over time, uric acid crystals can form into masses (tophi) near a joint, destroying bone and causing disfigurement. Gout is treatable and often preventable. CAUSES  The disease begins with elevated levels of uric acid in the blood. Uric acid is produced by your body when it breaks down a naturally found substance called purines. Certain foods you eat, such as meats and fish, contain high amounts of purines. Causes of an elevated uric acid level include:  Being passed down from parent to child (heredity).  Diseases that cause increased uric acid production (such as obesity, psoriasis, and certain cancers).  Excessive alcohol use.  Diet, especially diets rich in meat and seafood.  Medicines, including certain cancer-fighting medicines (chemotherapy), water pills (diuretics), and aspirin.  Chronic kidney disease. The kidneys are no longer able to remove uric acid well.  Problems with metabolism. Conditions strongly associated with gout include:  Obesity.  High blood pressure.  High cholesterol.  Diabetes. Not everyone with elevated uric acid levels gets gout. It is not understood why some people get gout and others do not. Surgery, joint injury, and eating too much of certain foods are some of the factors that can lead to gout attacks. SYMPTOMS   An attack of gout comes on quickly. It causes intense pain with redness, swelling, and warmth in a joint.  Fever can occur.  Often, only one joint is involved. Certain joints are more commonly involved:  Base of the big toe.  Knee.  Ankle.  Wrist.  Finger. Without  treatment, an attack usually goes away in a few days to weeks. Between attacks, you usually will not have symptoms, which is different from many other forms of arthritis. DIAGNOSIS  Your caregiver will suspect gout based on your symptoms and exam. In some cases, tests may be recommended. The tests may include:  Blood tests.  Urine tests.  X-rays.  Joint fluid exam. This exam requires a needle to remove fluid from the joint (arthrocentesis). Using a microscope, gout is confirmed when uric acid crystals are seen in the joint fluid. TREATMENT  There are two phases to gout treatment: treating the sudden onset (acute) attack and preventing attacks (prophylaxis).  Treatment of an Acute Attack.  Medicines are used. These include anti-inflammatory medicines or steroid medicines.  An injection of steroid medicine into the affected joint is sometimes necessary.  The painful joint is rested. Movement can worsen the arthritis.  You may use warm or cold treatments on painful joints, depending which works best for you.  Treatment to Prevent Attacks.  If you suffer from frequent gout attacks, your caregiver may advise preventive medicine. These medicines are started after the acute attack subsides. These medicines either help your kidneys eliminate uric acid from your body or decrease your uric acid production. You may need to stay on these medicines for a very long time.  The early phase of treatment with preventive medicine can be associated with an increase in acute gout attacks. For this reason, during the first few months of treatment, your caregiver may also advise you to take medicines usually used for acute gout treatment. Be sure you   understand your caregiver's directions. Your caregiver may make several adjustments to your medicine dose before these medicines are effective.  Discuss dietary treatment with your caregiver or dietitian. Alcohol and drinks high in sugar and fructose and foods  such as meat, poultry, and seafood can increase uric acid levels. Your caregiver or dietitian can advise you on drinks and foods that should be limited. HOME CARE INSTRUCTIONS   Do not take aspirin to relieve pain. This raises uric acid levels.  Only take over-the-counter or prescription medicines for pain, discomfort, or fever as directed by your caregiver.  Rest the joint as much as possible. When in bed, keep sheets and blankets off painful areas.  Keep the affected joint raised (elevated).  Apply warm or cold treatments to painful joints. Use of warm or cold treatments depends on which works best for you.  Use crutches if the painful joint is in your leg.  Drink enough fluids to keep your urine clear or pale yellow. This helps your body get rid of uric acid. Limit alcohol, sugary drinks, and fructose drinks.  Follow your dietary instructions. Pay careful attention to the amount of protein you eat. Your daily diet should emphasize fruits, vegetables, whole grains, and fat-free or low-fat milk products. Discuss the use of coffee, vitamin C, and cherries with your caregiver or dietitian. These may be helpful in lowering uric acid levels.  Maintain a healthy body weight. SEEK MEDICAL CARE IF:   You develop diarrhea, vomiting, or any side effects from medicines.  You do not feel better in 24 hours, or you are getting worse. SEEK IMMEDIATE MEDICAL CARE IF:   Your joint becomes suddenly more tender, and you have chills or a fever. MAKE SURE YOU:   Understand these instructions.  Will watch your condition.  Will get help right away if you are not doing well or get worse. Document Released: 01/03/2000 Document Revised: 05/22/2013 Document Reviewed: 08/19/2011 [employer] Patient Information 2015 [employer]. This information is not intended to replace advice given to you by your health care provider. Make sure you discuss any questions you have with your health care  provider. Low-Purine Diet Purines are compounds that affect the level of uric acid in your body. A low-purine diet is a diet that is low in purines. Eating a low-purine diet can prevent the level of uric acid in your body from getting too high and causing gout or kidney stones or both. WHAT DO I NEED TO KNOW ABOUT THIS DIET?  Choose low-purine foods. Examples of low-purine foods are listed in the next section.  Drink plenty of fluids, especially water. Fluids can help remove uric acid from your body. Try to drink 8-16 cups (1.9-3.8 L) a day.  Limit foods high in fat, especially saturated fat, as fat makes it harder for the body to get rid of uric acid. Foods high in saturated fat include pizza, cheese, ice cream, whole milk, fried foods, and gravies. Choose foods that are lower in fat and lean sources of protein. Use olive oil when cooking as it contains healthy fats that are not high in saturated fat.  Limit alcohol. Alcohol interferes with the elimination of uric acid from your body. If you are having a gout attack, avoid all alcohol.  Keep in mind that different people's bodies react differently to different foods. You will probably learn over time which foods do or do not affect you. If you discover that a food tends to cause your gout to flare up,   avoid eating that food. You can more freely enjoy foods that do not cause problems. If you have any questions about a food item, talk to your dietitian or health care provider. WHICH FOODS ARE LOW, MODERATE, AND HIGH IN PURINES? The following is a list of foods that are low, moderate, and high in purines. You can eat any amount of the foods that are low in purines. You may be able to have small amounts of foods that are moderate in purines. Ask your health care provider how much of a food moderate in purines you can have. Avoid foods high in purines. Grains  Foods low in purines: Enriched white bread, pasta, rice, cake, cornbread, popcorn.  Foods  moderate in purines: Whole-grain breads and cereals, wheat germ, bran, oatmeal. Uncooked oatmeal. Dry wheat bran or wheat germ.  Foods high in purines: Pancakes, French toast, biscuits, muffins. Vegetables  Foods low in purines: All vegetables, except those that are moderate in purines.  Foods moderate in purines: Asparagus, cauliflower, spinach, mushrooms, green peas. Fruits  All fruits are low in purines. Meats and other Protein Foods  Foods low in purines: Eggs, nuts, peanut butter.  Foods moderate in purines: 80-90% lean beef, lamb, veal, pork, poultry, fish, eggs, peanut butter, nuts. Crab, lobster, oysters, and shrimp. Cooked dried beans, peas, and lentils.  Foods high in purines: Anchovies, sardines, herring, mussels, tuna, codfish, scallops, trout, and haddock. Bacon. Organ meats (such as liver or kidney). Tripe. Game meat. Goose. Sweetbreads. Dairy  All dairy foods are low in purines. Low-fat and fat-free dairy products are best because they are low in saturated fat. Beverages  Drinks low in purines: Water, carbonated beverages, tea, coffee, cocoa.  Drinks moderate in purines: Soft drinks and other drinks sweetened with high-fructose corn syrup. Juices. To find whether a food or drink is sweetened with high-fructose corn syrup, look at the ingredients list.  Drinks high in purines: Alcoholic beverages (such as beer). Condiments  Foods low in purines: Salt, herbs, olives, pickles, relishes, vinegar.  Foods moderate in purines: Butter, margarine, oils, mayonnaise. Fats and Oils  Foods low in purines: All types, except gravies and sauces made with meat.  Foods high in purines: Gravies and sauces made with meat. Other Foods  Foods low in purines: Sugars, sweets, gelatin. Cake. Soups made without meat.  Foods moderate in purines: Meat-based or fish-based soups, broths, or bouillons. Foods and drinks sweetened with high-fructose corn syrup.  Foods high in purines:  High-fat desserts (such as ice cream, cookies, cakes, pies, doughnuts, and chocolate). Contact your dietitian for more information on foods that are not listed here. Document Released: 05/02/2010 Document Revised: 01/10/2013 Document Reviewed: 12/12/2012 [employer] Patient Information 2015 [employer]. This information is not intended to replace advice given to you by your health care provider. Make sure you discuss any questions you have with your health care provider.  

## 2014-03-12 NOTE — ED Notes (Signed)
C/o continued pain w gout in her left foot. Symptoms not completely resolved. Improvement w elevation ,but gets worse during day

## 2014-03-12 NOTE — ED Provider Notes (Signed)
CSN: [id number]     Arrival date & time 03/12/14  0802 History   First MD Initiated Contact with Patient 03/12/14 [phone]     Chief Complaint  Patient presents with  . Gout   (Consider location/radiation/quality/duration/timing/severity/associated sxs/prior Treatment) HPI Comments: Reports waxing & waning gout flare in left foot x 4 week. Has had some relief with colchicine prescribed in Jan 2016, but states while symptoms have improved, still with residual podagra at left great toe. Mentions successful treatment with prednisone taper in past.   The history is provided by the patient.    Past Medical History  Diagnosis Date  . Hypertension   . Diabetes mellitus   . Gout   . Depression   . Gout   . Hyperlipidemia   . Blood transfusion without reported diagnosis   . Morbid obesity   . Chronic kidney disease     STAGE 3 CHRONIC KIDNEY DISEASE SECONDARY TO DIABETIC GLOMERULOSCLEROSIS AND UNCONTROLLED HYPERTENSION - PER OFFICE NOTES DR. Florene Glen [facility].  Marland Kitchen Headache(784.0)     CHRONIC H/A'S  . Pain     LEFT SHOULDER PAIN - WAS SEEN AT AN URGENT CARE - GIVEN SLING FOR COMFORT AND TOLD ROM AS TOLERATED.   Past Surgical History  Procedure Laterality Date  . Cesarean section  1988, 1993  . Carpal tunnel release  2008  . Laparoscopic gastric sleeve resection N/A 07/19/2012    Procedure: LAPAROSCOPIC SLEEVE GASTRECTOMY with EGD;  Surgeon: Madilyn Hook, DO;  Location: [facility];  Service: General;  Laterality: N/A;  laparoscopic sleeve gastrectomy with EGD  . Upper gi endoscopy N/A 07/19/2012    Procedure: UPPER GI ENDOSCOPY;  Surgeon: Madilyn Hook, DO;  Location: [facility];  Service: General;  Laterality: N/A;  . Esophagogastroduodenoscopy N/A 09/04/2012    Procedure: ESOPHAGOGASTRODUODENOSCOPY (EGD);  Surgeon: Shann Medal, MD;  Location: [facility];  Service: General;  Laterality: N/A;  PF  . Esophagogastroduodenoscopy (egd) with esophageal dilation N/A 09/29/2012    Procedure:  ESOPHAGOGASTRODUODENOSCOPY (EGD) WITH ESOPHAGEAL DILATION;  Surgeon: Milus Banister, MD;  Location: [facility];  Service: Endoscopy;  Laterality: N/A;   Family History  Problem Relation Age of Onset  . Hypertension Mother   . Diabetes Father   . Arthritis Other   . Hyperlipidemia Other   . Cancer Neg Hx   . Heart disease Neg Hx   . Kidney disease Neg Hx   . Stroke Neg Hx    History  Substance Use Topics  . Smoking status: Former Smoker -- 20 years    Types: Cigarettes  . Smokeless tobacco: Never Used     Comment: QUIT SMOKING ABOUT 2009  . Alcohol Use: No   OB History    No data available     Review of Systems  All other systems reviewed and are negative.   Allergies  Amlodipine; Clonidine derivatives; and Welchol  Home Medications   Prior to Admission medications   Medication Sig Start Date End Date Taking? Authorizing Provider  nebivolol (BYSTOLIC) 10 MG tablet Take 2 tablets (20 mg total) by mouth daily. 12/22/13  Yes Biagio Borg, MD  olmesartan-hydrochlorothiazide (BENICAR HCT) 40-25 MG per tablet Take 1 tablet by mouth daily. 03/03/13  Yes [name], MD  colchicine 0.6 MG tablet Take 1 tablet (0.6 mg total) by mouth daily. 07/29/13   Tanna Furry, MD  colchicine 0.6 MG tablet Take 1 tablet (0.6 mg total) by mouth 2 (two) times daily. 02/19/14   Alroy Dust  H Baker, PA-C  HYDROcodone-acetaminophen (NORCO/VICODIN) 5-325 MG per tablet Take 2 tablets by mouth every 4 (four) hours as needed. 02/19/14   Liam Graham, PA-C  olmesartan-hydrochlorothiazide (BENICAR HCT) 40-25 MG per tablet Take 1 tablet by mouth daily. 12/22/13   Biagio Borg, MD  predniSONE (DELTASONE) 10 MG tablet 5 tabs po QD day 1, 4 tabs po QD day 2, 3 tabs po QD day 3, 2 tabs po QD day 4, 1 tab po QD day 5 then stop 03/12/14   Annett Gula H Presson, PA   BP 169/76 mmHg  Pulse 73  Temp(Src) 98.4 F (36.9 C) (Oral)  Resp 16  SpO2 96% Physical Exam  Constitutional: She is oriented to person, place, and  time. She appears well-nourished. No distress.  HENT:  Head: Normocephalic and atraumatic.  Eyes: Conjunctivae are normal.  Cardiovascular: Normal rate.   Pulmonary/Chest: Effort normal.  Musculoskeletal: Normal range of motion.       Feet:  Neurological: She is alert and oriented to person, place, and time.  Skin: Skin is warm and dry. No rash noted. There is erythema.  Psychiatric: She has a normal mood and affect. Her behavior is normal.  Nursing note and vitals reviewed.   ED Course  Procedures (including critical care time) Labs Review Labs Reviewed - No data to display  Imaging Review No results found.   MDM   1. Podagra   Prednisone taper as prescribed with PCP follow up if no improvement.     Joshua [place], [place] 03/12/14 [phone]

## 2014-04-10 ENCOUNTER — Encounter (HOSPITAL_COMMUNITY): Payer: Self-pay | Admitting: Emergency Medicine

## 2014-04-10 ENCOUNTER — Emergency Department (INDEPENDENT_AMBULATORY_CARE_PROVIDER_SITE_OTHER)
Admission: EM | Admit: 2014-04-10 | Discharge: 2014-04-10 | Disposition: A | Discharge status: Home / Self Care | Payer: 59 | Source: Home / Self Care | Attending: Family Medicine | Admitting: Family Medicine

## 2014-04-10 DIAGNOSIS — J029 Acute pharyngitis, unspecified: Secondary | ICD-10-CM | POA: Diagnosis not present

## 2014-04-10 DIAGNOSIS — B9789 Other viral agents as the cause of diseases classified elsewhere: Principal | ICD-10-CM

## 2014-04-10 DIAGNOSIS — J069 Acute upper respiratory infection, unspecified: Secondary | ICD-10-CM

## 2014-04-10 LAB — POCT RAPID STREP A: Streptococcus, Group A Screen (Direct): NEGATIVE

## 2014-04-10 MED ORDER — GUAIFENESIN-CODEINE 100-10 MG/5ML PO SOLN: 5.0000 mL | Freq: Every evening | ORAL | Status: DC | PRN

## 2014-04-10 MED ORDER — IPRATROPIUM BROMIDE 0.06 % NA SOLN: 2.0000 | Freq: Four times a day (QID) | NASAL | Status: DC

## 2014-04-10 NOTE — Discharge Instructions (Signed)
Thank you for coming in today. Call or go to the emergency room if you get worse, have trouble breathing, have chest pains, or palpitations.  Continue tylenol for pain as needed.    Cough, Adult  A cough is a reflex that helps clear your throat and airways. It can help heal the body or may be a reaction to an irritated airway. A cough may only last 2 or 3 weeks (acute) or may last more than 8 weeks (chronic).  CAUSES Acute cough:  Viral or bacterial infections. Chronic cough:  Infections.  Allergies.  Asthma.  Post-nasal drip.  Smoking.  Heartburn or acid reflux.  Some medicines.  Chronic lung problems (COPD).  Cancer. SYMPTOMS   Cough.  Fever.  Chest pain.  Increased breathing rate.  High-pitched whistling sound when breathing (wheezing).  Colored mucus that you cough up (sputum). TREATMENT   A bacterial cough may be treated with antibiotic medicine.  A viral cough must run its course and will not respond to antibiotics.  Your caregiver may recommend other treatments if you have a chronic cough. HOME CARE INSTRUCTIONS   Only take over-the-counter or prescription medicines for pain, discomfort, or fever as directed by your caregiver. Use cough suppressants only as directed by your caregiver.  Use a cold steam vaporizer or humidifier in your bedroom or home to help loosen secretions.  Sleep in a semi-upright position if your cough is worse at night.  Rest as needed.  Stop smoking if you smoke. SEEK IMMEDIATE MEDICAL CARE IF:   You have pus in your sputum.  Your cough starts to worsen.  You cannot control your cough with suppressants and are losing sleep.  You begin coughing up blood.  You have difficulty breathing.  You develop pain which is getting worse or is uncontrolled with medicine.  You have a fever. MAKE SURE YOU:   Understand these instructions.  Will watch your condition.  Will get help right away if you are not doing well or  get worse. Document Released: 07/04/2010 Document Revised: 03/30/2011 Document Reviewed: 07/04/2010 [facility] Patient Information 2015 [place], [place]. This information is not intended to replace advice given to you by your health care provider. Make sure you discuss any questions you have with your health care provider.

## 2014-04-10 NOTE — ED Notes (Signed)
Patient c/o sore throat and cough, reports clear phlegm initially, now green phlegm.  Onset 3/17 of symptoms.

## 2014-04-10 NOTE — ED Provider Notes (Signed)
Brenda Davis is a 49 y.o. female who presents to Urgent Care today for cough. Patient is a 39 history of cough and sore throat. The cough is bothersome especially at bedtime. No shortness of breath or wheezing. Patient initially had vomiting and diarrhea but had has resolved. She's tried Mucinex Tussend and Tylenol. She feels well otherwise.   Past Medical History  Diagnosis Date  . Hypertension   . Diabetes mellitus   . Gout   . Depression   . Gout   . Hyperlipidemia   . Blood transfusion without reported diagnosis   . Morbid obesity   . Chronic kidney disease     STAGE 3 CHRONIC KIDNEY DISEASE SECONDARY TO DIABETIC GLOMERULOSCLEROSIS AND UNCONTROLLED HYPERTENSION - PER OFFICE NOTES DR. [facility].  Marland Kitchen Headache(784.0)     CHRONIC H/A'S  . Pain     LEFT SHOULDER PAIN - WAS SEEN AT AN URGENT CARE - GIVEN SLING FOR COMFORT AND TOLD ROM AS TOLERATED.   Past Surgical History  Procedure Laterality Date  . Cesarean section  1988, 1993  . Carpal tunnel release  2008  . Laparoscopic gastric sleeve resection N/A 07/19/2012    Procedure: LAPAROSCOPIC SLEEVE GASTRECTOMY with EGD;  Surgeon: Madilyn Hook, DO;  Location: [facility];  Service: General;  Laterality: N/A;  laparoscopic sleeve gastrectomy with EGD  . Upper gi endoscopy N/A 07/19/2012    Procedure: UPPER GI ENDOSCOPY;  Surgeon: Madilyn Hook, DO;  Location: [facility];  Service: General;  Laterality: N/A;  . Esophagogastroduodenoscopy N/A 09/04/2012    Procedure: ESOPHAGOGASTRODUODENOSCOPY (EGD);  Surgeon: Shann Medal, MD;  Location: [facility];  Service: General;  Laterality: N/A;  PF  . Esophagogastroduodenoscopy (egd) with esophageal dilation N/A 09/29/2012    Procedure: ESOPHAGOGASTRODUODENOSCOPY (EGD) WITH ESOPHAGEAL DILATION;  Surgeon: Milus Banister, MD;  Location: [facility];  Service: Endoscopy;  Laterality: N/A;   History  Substance Use Topics  . Smoking status: Former Smoker -- 20 years    Types: Cigarettes   . Smokeless tobacco: Never Used     Comment: QUIT SMOKING ABOUT 2009  . Alcohol Use: No   ROS as above Medications: No current facility-administered medications for this encounter.   Current Outpatient Prescriptions  Medication Sig Dispense Refill  . acetaminophen (TYLENOL) 325 MG tablet Take 650 mg by mouth every 6 (six) hours as needed.    Marland Kitchen guaiFENesin (MUCINEX) 600 MG 12 hr tablet Take by mouth 2 (two) times daily.    . Pseudoephedrine-DM-GG (TUSSIN COLD/COUGH PO) Take by mouth.    Marland Kitchen guaiFENesin-codeine 100-10 MG/5ML syrup Take 5 mLs by mouth at bedtime as needed for cough. 120 mL 0  . ipratropium (ATROVENT) 0.06 % nasal spray Place 2 sprays into both nostrils 4 (four) times daily. 15 mL 1  . nebivolol (BYSTOLIC) 10 MG tablet Take 2 tablets (20 mg total) by mouth daily. 90 tablet 1  . olmesartan-hydrochlorothiazide (BENICAR HCT) 40-25 MG per tablet Take 1 tablet by mouth daily. 56 tablet 0  . olmesartan-hydrochlorothiazide (BENICAR HCT) 40-25 MG per tablet Take 1 tablet by mouth daily. 90 tablet 1  . [DISCONTINUED] colchicine 0.6 MG tablet Take 1 tablet (0.6 mg total) by mouth 2 (two) times daily. 20 tablet 1   Allergies  Allergen Reactions  . Amlodipine     edema  . Clonidine Derivatives Swelling  . Welchol [Colesevelam Hcl] Nausea Only     Exam:  BP 153/98 mmHg  Pulse 79  Temp(Src) 98.6 F (37 C) (  Oral)  Resp 16  SpO2 99% Gen: Well NAD HEENT: EOMI,  MMM, clear nasal discharge. Posterior pharynx cobblestoning. Normal tympanic membranes bilaterally. Lungs: Normal work of breathing. CTABL Heart: RRR no MRG Abd: NABS, Soft. Nondistended, Nontender Exts: Brisk capillary refill, warm and well perfused.   Results for orders placed or performed during the hospital encounter of 04/10/14 (from the past 24 hour(s))  POCT rapid strep A [facility])     Status: None   Collection Time: 04/10/14  8:36 AM  Result Value Ref Range   Streptococcus, Group A Screen (Direct)  NEGATIVE NEGATIVE   No results found.  Assessment and Plan: 49 y.o. female with viral URI with cough. Treat with Atrovent nasal spray, codeine cough syrup, and Tylenol.  Discussed warning signs or symptoms. Please see discharge instructions. Patient expresses understanding.     Gregor Hams, MD 04/10/14 [phone]

## 2014-04-10 NOTE — ED Notes (Signed)
Delay in patient care related to department acuity

## 2014-04-12 LAB — CULTURE, GROUP A STREP: Strep A Culture: NEGATIVE

## 2014-07-16 ENCOUNTER — Other Ambulatory Visit: Payer: Self-pay

## 2014-08-17 ENCOUNTER — Other Ambulatory Visit: Payer: Self-pay | Admitting: Internal Medicine

## 2014-09-13 ENCOUNTER — Ambulatory Visit (INDEPENDENT_AMBULATORY_CARE_PROVIDER_SITE_OTHER): Payer: 59 | Admitting: Internal Medicine

## 2014-09-13 ENCOUNTER — Encounter: Payer: Self-pay | Admitting: Internal Medicine

## 2014-09-13 VITALS — BP 160/108 | HR 59 | Temp 98.0°F

## 2014-09-13 DIAGNOSIS — E1122 Type 2 diabetes mellitus with diabetic chronic kidney disease: Secondary | ICD-10-CM | POA: Diagnosis not present

## 2014-09-13 DIAGNOSIS — I10 Essential (primary) hypertension: Secondary | ICD-10-CM | POA: Diagnosis not present

## 2014-09-13 DIAGNOSIS — N189 Chronic kidney disease, unspecified: Secondary | ICD-10-CM

## 2014-09-13 DIAGNOSIS — M1030 Gout due to renal impairment, unspecified site: Secondary | ICD-10-CM | POA: Insufficient documentation

## 2014-09-13 DIAGNOSIS — M109 Gout, unspecified: Secondary | ICD-10-CM

## 2014-09-13 MED ORDER — METHYLPREDNISOLONE ACETATE 80 MG/ML IJ SUSP
80.0000 mg | Freq: Once | INTRAMUSCULAR | Status: AC
  Administered 2014-09-13: 80 mg via INTRAMUSCULAR

## 2014-09-13 MED ORDER — PREDNISONE 10 MG PO TABS: ORAL_TABLET | ORAL | Status: DC

## 2014-09-13 MED ORDER — ALLOPURINOL 100 MG PO TABS: 100.0000 mg | ORAL_TABLET | Freq: Every day | ORAL | Status: DC

## 2014-09-13 NOTE — Assessment & Plan Note (Signed)
Mod to severe right ankle pain/swelling; for hydrocodon prn, also depomdrol IM, then predpac asd, also allopurinol 100 qd (due to ckd) after prednisone finished

## 2014-09-13 NOTE — Addendum Note (Signed)
Addended by: Lyman Bishop on: 09/13/2014 06:21 PM   Modules accepted: Orders

## 2014-09-13 NOTE — Assessment & Plan Note (Signed)
Not well controlled, likely in part related to current illness, obesity and ckd; o/w stable overall by history and exam, recent data reviewed with pt, and pt to continue medical treatment as before - declines other med change today,  to f/u any worsening symptoms or concerns BP Readings from Last 3 Encounters:  09/13/14 160/108  04/10/14 153/98  03/12/14 169/76

## 2014-09-13 NOTE — Patient Instructions (Signed)
You had the steroid shot today  Please take all new medication as prescribed - the prednisone, then start the Allopurinol after the prednisone finished  Please continue all other medications as before, and refills have been done if requested.  Please have the pharmacy call with any other refills you may need.  Please continue your efforts at being more active, low cholesterol diet, and weight control.  Please keep your appointments with your specialists as you may have planned

## 2014-09-13 NOTE — Assessment & Plan Note (Signed)
stable overall by history and exam, recent data reviewed with pt, and pt to continue medical treatment as before,  to f/u any worsening symptoms or concerns Lab Results  Component Value Date   HGBA1C 5.3 03/03/2013   Cont diet for now, pt to call for cbg > 200 on steroid tx

## 2014-09-13 NOTE — Progress Notes (Signed)
Subjective:    Patient ID: Brenda Davis, female    DOB: 28-Feb-1965, 49 y.o.   MRN: [id number]  HPI  Here with 2 days onset right ankle severe pain, swelliing, mild erythema similar to prior reactins to the right foot MTP area in the past, just woke up to severe pain yesterday am, home colchicine not helping, did have an small episode left great toe last wk that resolved on its own. Today very difficult to ambulate due to pain, no falls.,  No ulcer, trauma, fever or other injury.  Has known CKD , no hx of preventive tx tried in past such as allopurinol.  Has not Diet controlled DM, cbg's in low 100's most am.  Past Medical History  Diagnosis Date  . Hypertension   . Diabetes mellitus   . Gout   . Depression   . Gout   . Hyperlipidemia   . Blood transfusion without reported diagnosis   . Morbid obesity   . Chronic kidney disease     STAGE 3 CHRONIC KIDNEY DISEASE SECONDARY TO DIABETIC GLOMERULOSCLEROSIS AND UNCONTROLLED HYPERTENSION - PER OFFICE NOTES DR. Florene Glen [facility].  Marland Kitchen Headache(784.0)     CHRONIC H/A'S  . Pain     LEFT SHOULDER PAIN - WAS SEEN AT AN URGENT CARE - GIVEN SLING FOR COMFORT AND TOLD ROM AS TOLERATED.   Past Surgical History  Procedure Laterality Date  . Cesarean section  1988, 1993  . Carpal tunnel release  2008  . Laparoscopic gastric sleeve resection N/A 07/19/2012    Procedure: LAPAROSCOPIC SLEEVE GASTRECTOMY with EGD;  Surgeon: Madilyn Hook, DO;  Location: [facility];  Service: General;  Laterality: N/A;  laparoscopic sleeve gastrectomy with EGD  . Upper gi endoscopy N/A 07/19/2012    Procedure: UPPER GI ENDOSCOPY;  Surgeon: Madilyn Hook, DO;  Location: [facility];  Service: General;  Laterality: N/A;  . Esophagogastroduodenoscopy N/A 09/04/2012    Procedure: ESOPHAGOGASTRODUODENOSCOPY (EGD);  Surgeon: Shann Medal, MD;  Location: [facility];  Service: General;  Laterality: N/A;  PF  . Esophagogastroduodenoscopy (egd) with esophageal dilation N/A  09/29/2012    Procedure: ESOPHAGOGASTRODUODENOSCOPY (EGD) WITH ESOPHAGEAL DILATION;  Surgeon: Milus Banister, MD;  Location: [facility];  Service: Endoscopy;  Laterality: N/A;    reports that she has quit smoking. Her smoking use included Cigarettes. She quit after 20 years of use. She has never used smokeless tobacco. She reports that she does not drink alcohol or use illicit drugs. family history includes Arthritis in her other; Diabetes in her father; Hyperlipidemia in her other; Hypertension in her mother. There is no history of Cancer, Heart disease, Kidney disease, or Stroke. Allergies  Allergen Reactions  . Amlodipine     edema  . Clonidine Derivatives Swelling  . Welchol [Colesevelam Hcl] Nausea Only   Current Outpatient Prescriptions on File Prior to Visit  Medication Sig Dispense Refill  . acetaminophen (TYLENOL) 325 MG tablet Take 650 mg by mouth every 6 (six) hours as needed.    Marland Kitchen BYSTOLIC 10 MG tablet TAKE 2 TABLETS (20 MG TOTAL) BY MOUTH DAILY. 90 tablet 1  . ipratropium (ATROVENT) 0.06 % nasal spray Place 2 sprays into both nostrils 4 (four) times daily. 15 mL 1  . olmesartan-hydrochlorothiazide (BENICAR HCT) 40-25 MG per tablet Take 1 tablet by mouth daily. 56 tablet 0  . Pseudoephedrine-DM-GG (TUSSIN COLD/COUGH PO) Take by mouth.    Marland Kitchen guaiFENesin (MUCINEX) 600 MG 12 hr tablet Take by mouth 2 (two) times  daily.    . guaiFENesin-codeine 100-10 MG/5ML syrup Take 5 mLs by mouth at bedtime as needed for cough. (Patient not taking: Reported on 09/13/2014) 120 mL 0  . [DISCONTINUED] colchicine 0.6 MG tablet Take 1 tablet (0.6 mg total) by mouth 2 (two) times daily. 20 tablet 1   No current facility-administered medications on file prior to visit.   Review of Systems  Constitutional: Negative for unusual diaphoresis or night sweats HENT: Negative for ringing in ear or discharge Eyes: Negative for double vision or worsening visual disturbance.  Respiratory: Negative for choking  and stridor.   Gastrointestinal: Negative for vomiting or other signifcant bowel change Genitourinary: Negative for hematuria or change in urine volume.  Musculoskeletal: Negative for other MSK pain or swelling Skin: Negative for color change and worsening wound.  Neurological: Negative for tremors and numbness other than noted  Psychiatric/Behavioral: Negative for decreased concentration or agitation other than above       Objective:   Physical Exam BP 160/108 mmHg  Pulse 59  Temp(Src) 98 F (36.7 C) (Oral)  SpO2 99% VS noted,  Constitutional: Pt appears in no significant distress HENT: Head: NCAT.  Right Ear: External ear normal.  Left Ear: External ear normal.  Eyes: . Pupils are equal, round, and reactive to light. Conjunctivae and EOM are normal Neck: Normal range of motion. Neck supple.  Cardiovascular: Normal rate and regular rhythm.   Pulmonary/Chest: Effort normal and breath sounds without rales or wheezing.  Abd:  Soft, NT, ND, + BS Neurological: Pt is alert. Not confused , motor grossly intact Skin: Skin is warm. No rash, no LE edema Psychiatric: Pt behavior is normal. No agitation.   Lab Results  Component Value Date   HGBA1C 5.3 03/03/2013       Assessment & Plan:

## 2014-11-25 ENCOUNTER — Emergency Department (INDEPENDENT_AMBULATORY_CARE_PROVIDER_SITE_OTHER)
Admission: EM | Admit: 2014-11-25 | Discharge: 2014-11-25 | Disposition: A | Discharge status: Home / Self Care | Payer: 59 | Source: Home / Self Care | Attending: Emergency Medicine | Admitting: Emergency Medicine

## 2014-11-25 ENCOUNTER — Encounter (HOSPITAL_COMMUNITY): Payer: Self-pay | Admitting: *Deleted

## 2014-11-25 DIAGNOSIS — M10072 Idiopathic gout, left ankle and foot: Secondary | ICD-10-CM

## 2014-11-25 DIAGNOSIS — M109 Gout, unspecified: Secondary | ICD-10-CM

## 2014-11-25 DIAGNOSIS — I159 Secondary hypertension, unspecified: Secondary | ICD-10-CM

## 2014-11-25 MED ORDER — PREDNISONE 50 MG PO TABS: 50.0000 mg | ORAL_TABLET | Freq: Every day | ORAL | Status: DC

## 2014-11-25 MED ORDER — DICLOFENAC SODIUM 1 % TD GEL
1.0000 | Freq: Four times a day (QID) | TRANSDERMAL | Status: DC
Start: 2014-11-25 — End: 2015-01-01

## 2014-11-25 NOTE — ED Notes (Signed)
C/O gout flare-up left ankle x2 days.  States has been getting increasingly more flare-ups.  Had not taken either HTN med since yesterday; upon assessing BP, pt took her own HTN med in RN presence.

## 2014-11-25 NOTE — Discharge Instructions (Signed)
Stop the Naprosyn last Aleve/Advil. Start taking Tylenol 1 g up to 4 times a day as needed for pain. Start the topical diclofenac instead of the Aleve or Advil. Use the steroids and restart your allopurinol

## 2014-11-25 NOTE — ED Provider Notes (Signed)
HPI  SUBJECTIVE:  Brenda Davis is a 49 y.o. female who presents with sharp, throbbing, left ankle pain for the past 2 days. States is getting worse. States her symptoms started after eating some shrimp, and missing her allopurinol.Symptoms are worse with palpation, walking, holding it in dependent position, better with elevation. She has tried Aleve last dose was 4 hours ago, crutches. She has missed her last several doses of allopurinol. No nausea, vomiting, fevers, rash. no erythema, edema. She reports limitation of motion secondary to pain. No hyperesthesias. No trauma. She states that this is identical to previous gout flares which have been located in the toe and in her left ankle. Last attack was in Aug 2016. She states it resolved with a prednisone taper. Past medical history of gout, diet controlled diabetes, chronic kidney disease, stage III. No history of septic joint, prostatic joint, immunocompromise.    Past Medical History  Diagnosis Date  . Hypertension   . Diabetes mellitus   . Gout   . Depression   . Gout   . Hyperlipidemia   . Blood transfusion without reported diagnosis   . Morbid obesity ([place])   . Chronic kidney disease     STAGE 3 CHRONIC KIDNEY DISEASE SECONDARY TO DIABETIC GLOMERULOSCLEROSIS AND UNCONTROLLED HYPERTENSION - PER OFFICE NOTES DR. Florene Glen [facility].  Marland Kitchen Headache(784.0)     CHRONIC H/A'S  . Pain     LEFT SHOULDER PAIN - WAS SEEN AT AN URGENT CARE - GIVEN SLING FOR COMFORT AND TOLD ROM AS TOLERATED.    Past Surgical History  Procedure Laterality Date  . Cesarean section  1988, 1993  . Carpal tunnel release  2008  . Laparoscopic gastric sleeve resection N/A 07/19/2012    Procedure: LAPAROSCOPIC SLEEVE GASTRECTOMY with EGD;  Surgeon: Madilyn Hook, DO;  Location: [facility];  Service: General;  Laterality: N/A;  laparoscopic sleeve gastrectomy with EGD  . Upper gi endoscopy N/A 07/19/2012    Procedure: UPPER GI ENDOSCOPY;  Surgeon: Madilyn Hook,  DO;  Location: [facility];  Service: General;  Laterality: N/A;  . Esophagogastroduodenoscopy N/A 09/04/2012    Procedure: ESOPHAGOGASTRODUODENOSCOPY (EGD);  Surgeon: Shann Medal, MD;  Location: [facility];  Service: General;  Laterality: N/A;  PF  . Esophagogastroduodenoscopy (egd) with esophageal dilation N/A 09/29/2012    Procedure: ESOPHAGOGASTRODUODENOSCOPY (EGD) WITH ESOPHAGEAL DILATION;  Surgeon: Milus Banister, MD;  Location: [facility];  Service: Endoscopy;  Laterality: N/A;    Family History  Problem Relation Age of Onset  . Hypertension Mother   . Diabetes Father   . Arthritis Other   . Hyperlipidemia Other   . Cancer Neg Hx   . Heart disease Neg Hx   . Kidney disease Neg Hx   . Stroke Neg Hx     Social History  Substance Use Topics  . Smoking status: Former Smoker -- 20 years    Types: Cigarettes  . Smokeless tobacco: Never Used     Comment: QUIT SMOKING ABOUT 2009  . Alcohol Use: No    No current facility-administered medications for this encounter.  Current outpatient prescriptions:  .  allopurinol (ZYLOPRIM) 100 MG tablet, Take 1 tablet (100 mg total) by mouth daily., Disp: 90 tablet, Rfl: 3 .  BYSTOLIC 10 MG tablet, TAKE 2 TABLETS (20 MG TOTAL) BY MOUTH DAILY., Disp: 90 tablet, Rfl: 1 .  olmesartan-hydrochlorothiazide (BENICAR HCT) 40-25 MG per tablet, Take 1 tablet by mouth daily., Disp: 56 tablet, Rfl: 0 .  acetaminophen (TYLENOL)  325 MG tablet, Take 650 mg by mouth every 6 (six) hours as needed., Disp: , Rfl:  .  diclofenac sodium (VOLTAREN) 1 % GEL, Apply 1 application topically 4 (four) times daily., Disp: 100 g, Rfl: 0 .  ipratropium (ATROVENT) 0.06 % nasal spray, Place 2 sprays into both nostrils 4 (four) times daily., Disp: 15 mL, Rfl: 1 .  predniSONE (DELTASONE) 50 MG tablet, Take 1 tablet (50 mg total) by mouth daily with breakfast., Disp: 3 tablet, Rfl: 0 .  [DISCONTINUED] colchicine 0.6 MG tablet, Take 1 tablet (0.6 mg total) by mouth 2 (two) times  daily., Disp: 20 tablet, Rfl: 1  Allergies  Allergen Reactions  . Amlodipine     edema  . Clonidine Derivatives Swelling  . Welchol [Colesevelam Hcl] Nausea Only     ROS  As noted in HPI.   Physical Exam  BP 204/106 mmHg  Pulse 63  Temp(Src) 98.3 F (36.8 C) (Oral)  Resp 16  SpO2 98%  Constitutional: Well developed, well nourished, no acute distress Eyes:  EOMI, conjunctiva normal bilaterally HENT: Normocephalic, atraumatic,mucus membranes moist Respiratory: Normal inspiratory effort Cardiovascular: Normal rate GI: nondistended skin: No rash, skin intact Musculoskeletal: Left ankle no bony tenderness over medial lateral malleolus. No bony tenderness over entire foot. No erythema, increased temperature. Pain with inversion/eversion. No pain with dorsiflexion/plantar flexion. No swelling. Patient neurovascularly intact distally. Pt not able to bear weight in dept. No evidence of trauma.  Neurologic: Alert & oriented x 3, no focal neuro deficits Psychiatric: Speech and behavior appropriate   ED Course   Medications - No data to display  No orders of the defined types were placed in this encounter.    No results found for this or any previous visit (from the past 24 hour(s)). No results found.  ED Clinical Impression  Acute gout of left ankle, unspecified cause  Secondary hypertension, unspecified  ED Assessment/Plan  Pt hypertensive today.  Has not taken BP meds today.  Pt denies any CNS type sx such as HA, visual changes, focal paresis. Pt denies any CV sx such as CP, dyspnea, palpitations, pedal edema, tearing pain radiating to back or abd. Pt denied any renal sx such as anuria.. Discussed importance of lifestyle modifications as important first steps, and the importance of taking usual BP medications. Pt to f/u as OP.   Presentation is consistent with gout flare. Plan to discontinue Aleve/Advil, start Tylenol, topical diclofenac due to kidney disease. No  colchicine to the chronic kidney disease stage III. Prednisone burst 50 mg for 3 days, patient to restart the allopurinol. Patient declined new prescription of allopurinol. No evidence of fracture or septic joint. Follow-up with primary care physician as needed. Discussed medical decision-making, plan for follow-up, signs and symptoms that should prompt return to the ER. Patient agrees with plan.  *This clinic note was created using Dragon dictation software. Therefore, there may be occasional mistakes despite careful proofreading.  ?   Melynda Ripple, MD 11/25/14 1740

## 2014-11-30 ENCOUNTER — Other Ambulatory Visit: Payer: Self-pay | Admitting: Internal Medicine

## 2014-11-30 NOTE — Telephone Encounter (Signed)
lmovm to call back to schedule an OV

## 2014-11-30 NOTE — Telephone Encounter (Signed)
This pt needs to be put on the schedule has not been seen since 02/2013. And wants a rf. thanks

## 2014-12-10 ENCOUNTER — Other Ambulatory Visit: Payer: Self-pay | Admitting: Internal Medicine

## 2014-12-15 ENCOUNTER — Other Ambulatory Visit: Payer: Self-pay

## 2014-12-15 DIAGNOSIS — I10 Essential (primary) hypertension: Secondary | ICD-10-CM

## 2014-12-15 NOTE — Telephone Encounter (Signed)
A user error has taken place.

## 2014-12-20 ENCOUNTER — Encounter: Payer: Self-pay | Admitting: Internal Medicine

## 2014-12-20 ENCOUNTER — Ambulatory Visit (INDEPENDENT_AMBULATORY_CARE_PROVIDER_SITE_OTHER): Payer: 59 | Admitting: Internal Medicine

## 2014-12-20 VITALS — BP 212/122 | HR 62 | Temp 97.9°F | Ht 65.0 in | Wt 249.0 lb

## 2014-12-20 DIAGNOSIS — I10 Essential (primary) hypertension: Secondary | ICD-10-CM

## 2014-12-20 DIAGNOSIS — N183 Chronic kidney disease, stage 3 unspecified: Secondary | ICD-10-CM

## 2014-12-20 DIAGNOSIS — N189 Chronic kidney disease, unspecified: Secondary | ICD-10-CM

## 2014-12-20 DIAGNOSIS — E1122 Type 2 diabetes mellitus with diabetic chronic kidney disease: Secondary | ICD-10-CM

## 2014-12-20 MED ORDER — NEBIVOLOL HCL 10 MG PO TABS: ORAL_TABLET | ORAL | Status: DC

## 2014-12-20 MED ORDER — OLMESARTAN-AMLODIPINE-HCTZ 40-5-25 MG PO TABS: ORAL_TABLET | ORAL | Status: DC

## 2014-12-20 NOTE — Patient Instructions (Signed)
Please take all new medication as prescribed  - the tribenzor (generic) at 40-5-25 mg per day  Please continue all other medications as before, and refills have been done if requested  - the bystolic  Please check your BP at home, if < 140/90 in 1 week, then call to have your medications sent in to the [facility]  Otherwise please another appt with Dr Ronnald Ramp or myself to check the BP in the office  Please have the pharmacy call with any other refills you may need.  Please continue your efforts at being more active, low cholesterol diet, and weight control.  Please keep your appointments with your specialists as you may have planned  Please return in 1 month to Dr Ronnald Ramp, if you are not seen soonere

## 2014-12-20 NOTE — Progress Notes (Signed)
Pre visit review using our clinic review tool, if applicable. No additional management support is needed unless otherwise documented below in the visit note. 

## 2014-12-21 ENCOUNTER — Telehealth: Payer: Self-pay | Admitting: Internal Medicine

## 2014-12-21 NOTE — Assessment & Plan Note (Signed)
Has not seen renal recently for this by chart,  Lab Results  Component Value Date   CREATININE 2.9* 03/03/2013   No edema, but at high risk for worsening due to risk factors, pt agrees to f/u with renal as well

## 2014-12-21 NOTE — Telephone Encounter (Signed)
Left vm for patient to call back to schedule 3 to 4 week fu with Dr. Ronnald Ramp per Dr. Jenny Reichmann.

## 2014-12-21 NOTE — Assessment & Plan Note (Signed)
Has had good control in past,  Lab Results  Component Value Date   HGBA1C 5.3 03/03/2013   stable overall by history and exam, recent data reviewed with pt, and pt to continue medical treatment as before,  to f/u any worsening symptoms or concerns, but needs f/u, declines labs tonight (lab closed) but agrees to return for labs with PCP

## 2014-12-21 NOTE — Progress Notes (Signed)
Subjective:    Patient ID: Brenda Davis, female    DOB: 1965/08/20, 49 y.o.   MRN: [id number]  HPI  Here to f/u HTN, not seen by PCP since feb 2015, was seen for acute visit aug 2016, Pt denies chest pain, increased sob or doe, wheezing, orthopnea, PND, increased LE swelling, palpitations, dizziness or syncope.  Pt denies new neurological symptoms such as new headache, or facial or extremity weakness or numbness   Pt denies polydipsia, polyuria, But has been out of BP meds for 1 month due to some difficulty in getting refills, despite calls to the pharmacy.  Has long hx of HTN, states often at home off meds she is 220/102, and with recent meds has been about 140's sbp.  Hard to lose wt, in fact has cont'd to gain.  Pt denies polydipsia, polyuria, has not had a1c in some time, or renal f/u.  Has had some swelling that may have been related to amlodipine in the past but willing to try again. Lab Results  Component Value Date   CREATININE 2.9* 03/03/2013   Lab Results  Component Value Date   HGBA1C 5.3 03/03/2013    Wt Readings from Last 3 Encounters:  12/20/14 249 lb (112.946 kg)  03/03/13 245 lb (111.131 kg)  11/28/12 234 lb (106.142 kg)   BP Readings from Last 3 Encounters:  12/20/14 212/122  11/25/14 204/106  09/13/14 160/108   Past Medical History  Diagnosis Date  . Hypertension   . Diabetes mellitus   . Gout   . Depression   . Gout   . Hyperlipidemia   . Blood transfusion without reported diagnosis   . Morbid obesity ([place])   . Chronic kidney disease     STAGE 3 CHRONIC KIDNEY DISEASE SECONDARY TO DIABETIC GLOMERULOSCLEROSIS AND UNCONTROLLED HYPERTENSION - PER OFFICE NOTES DR. Florene Glen [facility].  Marland Kitchen Headache(784.0)     CHRONIC H/A'S  . Pain     LEFT SHOULDER PAIN - WAS SEEN AT AN URGENT CARE - GIVEN SLING FOR COMFORT AND TOLD ROM AS TOLERATED.   Past Surgical History  Procedure Laterality Date  . Cesarean section  1988, 1993  . Carpal tunnel release   2008  . Laparoscopic gastric sleeve resection N/A 07/19/2012    Procedure: LAPAROSCOPIC SLEEVE GASTRECTOMY with EGD;  Surgeon: Madilyn Hook, DO;  Location: [facility];  Service: General;  Laterality: N/A;  laparoscopic sleeve gastrectomy with EGD  . Upper gi endoscopy N/A 07/19/2012    Procedure: UPPER GI ENDOSCOPY;  Surgeon: Madilyn Hook, DO;  Location: [facility];  Service: General;  Laterality: N/A;  . Esophagogastroduodenoscopy N/A 09/04/2012    Procedure: ESOPHAGOGASTRODUODENOSCOPY (EGD);  Surgeon: Shann Medal, MD;  Location: [facility];  Service: General;  Laterality: N/A;  PF  . Esophagogastroduodenoscopy (egd) with esophageal dilation N/A 09/29/2012    Procedure: ESOPHAGOGASTRODUODENOSCOPY (EGD) WITH ESOPHAGEAL DILATION;  Surgeon: Milus Banister, MD;  Location: [facility];  Service: Endoscopy;  Laterality: N/A;    reports that she has quit smoking. Her smoking use included Cigarettes. She quit after 20 years of use. She has never used smokeless tobacco. She reports that she does not drink alcohol or use illicit drugs. family history includes Arthritis in her other; Diabetes in her father; Hyperlipidemia in her other; Hypertension in her mother. There is no history of Cancer, Heart disease, Kidney disease, or Stroke. Allergies  Allergen Reactions  . Amlodipine     edema  . Clonidine Derivatives Swelling  .  Welchol [Colesevelam Hcl] Nausea Only   Current Outpatient Prescriptions on File Prior to Visit  Medication Sig Dispense Refill  . acetaminophen (TYLENOL) 325 MG tablet Take 650 mg by mouth every 6 (six) hours as needed.    Marland Kitchen allopurinol (ZYLOPRIM) 100 MG tablet Take 1 tablet (100 mg total) by mouth daily. 90 tablet 3  . diclofenac sodium (VOLTAREN) 1 % GEL Apply 1 application topically 4 (four) times daily. 100 g 0  . ipratropium (ATROVENT) 0.06 % nasal spray Place 2 sprays into both nostrils 4 (four) times daily. 15 mL 1  . predniSONE (DELTASONE) 50 MG tablet Take 1 tablet (50 mg total)  by mouth daily with breakfast. (Patient not taking: Reported on 12/20/2014) 3 tablet 0  . [DISCONTINUED] colchicine 0.6 MG tablet Take 1 tablet (0.6 mg total) by mouth 2 (two) times daily. 20 tablet 1   No current facility-administered medications on file prior to visit.   Review of Systems  Constitutional: Negative for unusual diaphoresis or night sweats HENT: Negative for ringing in ear or discharge Eyes: Negative for double vision or worsening visual disturbance.  Respiratory: Negative for choking and stridor.   Gastrointestinal: Negative for vomiting or other signifcant bowel change Genitourinary: Negative for hematuria or change in urine volume.  Musculoskeletal: Negative for other MSK pain or swelling Skin: Negative for color change and worsening wound.  Neurological: Negative for tremors and numbness other than noted  Psychiatric/Behavioral: Negative for decreased concentration or agitation other than above       Objective:   Physical Exam BP 212/122 mmHg  Pulse 62  Temp(Src) 97.9 F (36.6 C) (Oral)  Ht 5\' 5"  (1.651 m)  Wt 249 lb (112.946 kg)  BMI 41.44 kg/m2  SpO2 98% VS noted, not ill appearing Constitutional: Pt appears in no significant distress HENT: Head: NCAT.  Right Ear: External ear normal.  Left Ear: External ear normal.  Eyes: . Pupils are equal, round, and reactive to light. Conjunctivae and EOM are normal Neck: Normal range of motion. Neck supple.  Cardiovascular: Normal rate and regular rhythm.   Pulmonary/Chest: Effort normal and breath sounds without rales or wheezing.  Neurological: Pt is alert. Not confused , motor grossly intact Skin: Skin is warm. No rash, no LE edema Psychiatric: Pt behavior is normal. No agitation.     Assessment & Plan:

## 2014-12-21 NOTE — Assessment & Plan Note (Signed)
Severe uncontrolled due to not taking meds recently, but was hard to control even on prior meds; will cont bystolic 10 bid for now, but change benicar HCT to tribenzor 40-5-25 with lower amlod dosing hopefully to avoid LE swelling.  Pt urged to cont monitor at home but needs f/u with PCP in 3-4 wks; consider change bystolic to labetolol or hydralazine

## 2014-12-24 ENCOUNTER — Ambulatory Visit: Payer: 59 | Admitting: Internal Medicine

## 2014-12-31 ENCOUNTER — Emergency Department (HOSPITAL_COMMUNITY)
Admission: EM | Admit: 2014-12-31 | Discharge: 2014-12-31 | Disposition: A | Discharge status: Home / Self Care | Payer: 59 | Source: Home / Self Care | Attending: Emergency Medicine | Admitting: Emergency Medicine

## 2014-12-31 ENCOUNTER — Other Ambulatory Visit (INDEPENDENT_AMBULATORY_CARE_PROVIDER_SITE_OTHER): Payer: 59

## 2014-12-31 ENCOUNTER — Encounter: Payer: Self-pay | Admitting: Internal Medicine

## 2014-12-31 ENCOUNTER — Telehealth: Payer: Self-pay

## 2014-12-31 ENCOUNTER — Encounter (HOSPITAL_COMMUNITY): Payer: Self-pay | Admitting: Emergency Medicine

## 2014-12-31 ENCOUNTER — Ambulatory Visit (INDEPENDENT_AMBULATORY_CARE_PROVIDER_SITE_OTHER): Payer: 59 | Admitting: Internal Medicine

## 2014-12-31 VITALS — BP 160/92 | HR 60 | Temp 98.0°F | Resp 16 | Ht 65.0 in | Wt 295.0 lb

## 2014-12-31 DIAGNOSIS — E785 Hyperlipidemia, unspecified: Secondary | ICD-10-CM

## 2014-12-31 DIAGNOSIS — R739 Hyperglycemia, unspecified: Secondary | ICD-10-CM

## 2014-12-31 DIAGNOSIS — R0600 Dyspnea, unspecified: Secondary | ICD-10-CM

## 2014-12-31 DIAGNOSIS — R0609 Other forms of dyspnea: Secondary | ICD-10-CM | POA: Diagnosis not present

## 2014-12-31 DIAGNOSIS — D529 Folate deficiency anemia, unspecified: Secondary | ICD-10-CM | POA: Diagnosis not present

## 2014-12-31 DIAGNOSIS — R809 Proteinuria, unspecified: Secondary | ICD-10-CM

## 2014-12-31 DIAGNOSIS — N19 Unspecified kidney failure: Secondary | ICD-10-CM | POA: Insufficient documentation

## 2014-12-31 DIAGNOSIS — E876 Hypokalemia: Secondary | ICD-10-CM

## 2014-12-31 DIAGNOSIS — D510 Vitamin B12 deficiency anemia due to intrinsic factor deficiency: Secondary | ICD-10-CM

## 2014-12-31 DIAGNOSIS — N179 Acute kidney failure, unspecified: Secondary | ICD-10-CM

## 2014-12-31 DIAGNOSIS — E519 Thiamine deficiency, unspecified: Secondary | ICD-10-CM

## 2014-12-31 DIAGNOSIS — I10 Essential (primary) hypertension: Secondary | ICD-10-CM

## 2014-12-31 DIAGNOSIS — I120 Hypertensive chronic kidney disease with stage 5 chronic kidney disease or end stage renal disease: Secondary | ICD-10-CM | POA: Diagnosis not present

## 2014-12-31 DIAGNOSIS — R600 Localized edema: Secondary | ICD-10-CM | POA: Diagnosis not present

## 2014-12-31 DIAGNOSIS — Z9884 Bariatric surgery status: Secondary | ICD-10-CM | POA: Diagnosis not present

## 2014-12-31 LAB — BRAIN NATRIURETIC PEPTIDE: Pro B Natriuretic peptide (BNP): 477 pg/mL — ABNORMAL HIGH (ref 0.0–100.0)

## 2014-12-31 LAB — COMPREHENSIVE METABOLIC PANEL
ALT: 13 U/L (ref 0–35)
ALT: 16 U/L (ref 14–54)
AST: 10 U/L (ref 0–37)
AST: 11 U/L — ABNORMAL LOW (ref 15–41)
Albumin: 3.4 g/dL — ABNORMAL LOW (ref 3.5–5.2)
Albumin: 3.4 g/dL — ABNORMAL LOW (ref 3.5–5.0)
Alkaline Phosphatase: 79 U/L (ref 39–117)
Alkaline Phosphatase: 75 U/L (ref 38–126)
Anion gap: 15 (ref 5–15)
BUN: 102 mg/dL (ref 6–23)
BUN: 76 mg/dL — ABNORMAL HIGH (ref 6–20)
CO2: 19 mEq/L (ref 19–32)
CO2: 18 mmol/L — ABNORMAL LOW (ref 22–32)
Calcium: 7.4 mg/dL — ABNORMAL LOW (ref 8.4–10.5)
Calcium: 7.4 mg/dL — ABNORMAL LOW (ref 8.9–10.3)
Chloride: 107 mEq/L (ref 96–112)
Chloride: 110 mmol/L (ref 101–111)
Creatinine, Ser: 13.33 mg/dL (ref 0.40–1.20)
Creatinine, Ser: 13.88 mg/dL — ABNORMAL HIGH (ref 0.44–1.00)
GFR calc Af Amer: 3 mL/min — ABNORMAL LOW (ref >60)
GFR calc non Af Amer: 3 mL/min — ABNORMAL LOW (ref >60)
GFR: 3.8 mL/min — CL (ref >60.00)
Glucose, Bld: 85 mg/dL (ref 70–99)
Glucose, Bld: 95 mg/dL (ref 65–99)
Potassium: 5.1 mEq/L (ref 3.5–5.1)
Potassium: 4.8 mmol/L (ref 3.5–5.1)
Sodium: 143 mEq/L (ref 135–145)
Sodium: 143 mmol/L (ref 135–145)
Total Bilirubin: 0.3 mg/dL (ref 0.2–1.2)
Total Bilirubin: 0.6 mg/dL (ref 0.3–1.2)
Total Protein: 6.4 g/dL (ref 6.0–8.3)
Total Protein: 6.8 g/dL (ref 6.5–8.1)

## 2014-12-31 LAB — LIPID PANEL
Cholesterol: 189 mg/dL (ref 0–200)
HDL: 50.4 mg/dL (ref >39.00)
LDL Cholesterol: 123 mg/dL — ABNORMAL HIGH (ref 0–99)
NonHDL: 138.85
Total CHOL/HDL Ratio: 4
Triglycerides: 80 mg/dL (ref 0.0–149.0)
VLDL: 16 mg/dL (ref 0.0–40.0)

## 2014-12-31 LAB — URINALYSIS, ROUTINE W REFLEX MICROSCOPIC
Bilirubin Urine: NEGATIVE
Ketones, ur: NEGATIVE
Leukocytes, UA: NEGATIVE
Nitrite: NEGATIVE
Specific Gravity, Urine: 1.02 (ref 1.000–1.030)
Total Protein, Urine: >=300 — AB
Urine Glucose: 100 — AB
Urobilinogen, UA: 0.2 (ref 0.0–1.0)
pH: 6 (ref 5.0–8.0)

## 2014-12-31 LAB — TSH: TSH: 2.56 u[IU]/mL (ref 0.35–4.50)

## 2014-12-31 LAB — FOLATE: Folate: 5.7 ng/mL — ABNORMAL LOW (ref >5.9)

## 2014-12-31 LAB — CBC
HCT: 23.8 % — ABNORMAL LOW (ref 36.0–46.0)
Hemoglobin: 7.7 g/dL — ABNORMAL LOW (ref 12.0–15.0)
MCH: 30.4 pg (ref 26.0–34.0)
MCHC: 32.4 g/dL (ref 30.0–36.0)
MCV: 94.1 fL (ref 78.0–100.0)
Platelets: 297 10*3/uL (ref 150–400)
RBC: 2.53 MIL/uL — ABNORMAL LOW (ref 3.87–5.11)
RDW: 13.7 % (ref 11.5–15.5)
WBC: 6.6 10*3/uL (ref 4.0–10.5)

## 2014-12-31 LAB — CBC WITH DIFFERENTIAL/PLATELET
Basophils Absolute: 0 10*3/uL (ref 0.0–0.1)
Basophils Relative: 0.5 % (ref 0.0–3.0)
Eosinophils Absolute: 1.4 10*3/uL — ABNORMAL HIGH (ref 0.0–0.7)
Eosinophils Relative: 24.1 % — ABNORMAL HIGH (ref 0.0–5.0)
HCT: 24.9 % — ABNORMAL LOW (ref 36.0–46.0)
Hemoglobin: 8.1 g/dL — ABNORMAL LOW (ref 12.0–15.0)
Lymphocytes Relative: 18.1 % (ref 12.0–46.0)
Lymphs Abs: 1.1 10*3/uL (ref 0.7–4.0)
MCHC: 32.3 g/dL (ref 30.0–36.0)
MCV: 93.6 fl (ref 78.0–100.0)
Monocytes Absolute: 0.7 10*3/uL (ref 0.1–1.0)
Monocytes Relative: 11.9 % (ref 3.0–12.0)
Neutro Abs: 2.7 10*3/uL (ref 1.4–7.7)
Neutrophils Relative %: 45.4 % (ref 43.0–77.0)
Platelets: 307 10*3/uL (ref 150.0–400.0)
RBC: 2.66 Mil/uL — ABNORMAL LOW (ref 3.87–5.11)
RDW: 14.8 % (ref 11.5–15.5)
WBC: 6 10*3/uL (ref 4.0–10.5)

## 2014-12-31 LAB — FERRITIN: Ferritin: 223.4 ng/mL (ref 10.0–291.0)

## 2014-12-31 LAB — VITAMIN D 25 HYDROXY (VIT D DEFICIENCY, FRACTURES): VITD: 4.61 ng/mL — ABNORMAL LOW (ref 30.00–100.00)

## 2014-12-31 LAB — IBC PANEL
Iron: 36 ug/dL — ABNORMAL LOW (ref 42–145)
Saturation Ratios: 10.6 % — ABNORMAL LOW (ref 20.0–50.0)
Transferrin: 242 mg/dL (ref 212.0–360.0)

## 2014-12-31 LAB — TROPONIN I: TNIDX: 0.01 ug/l (ref 0.00–0.06)

## 2014-12-31 LAB — CARDIAC PANEL
CK-MB: 3.1 ng/mL (ref 0.3–4.0)
Relative Index: 2.1 calc (ref 0.0–2.5)
Total CK: 146 U/L (ref 7–177)

## 2014-12-31 LAB — VITAMIN B12: Vitamin B-12: 443 pg/mL (ref 211–911)

## 2014-12-31 LAB — LIPASE, BLOOD: Lipase: 36 U/L (ref 11–51)

## 2014-12-31 MED ORDER — LOSARTAN POTASSIUM-HCTZ 100-25 MG PO TABS: 1.0000 | ORAL_TABLET | Freq: Every day | ORAL | Status: DC

## 2014-12-31 NOTE — ED Notes (Signed)
Attempted to call pt from waiting room, without answer.

## 2014-12-31 NOTE — Patient Instructions (Signed)
Hypertension Hypertension, commonly called high blood pressure, is when the force of blood pumping through your arteries is too strong. Your arteries are the blood vessels that carry blood from your heart throughout your body. A blood pressure reading consists of a higher number over a lower number, such as 110/72. The higher number (systolic) is the pressure inside your arteries when your heart pumps. The lower number (diastolic) is the pressure inside your arteries when your heart relaxes. Ideally you want your blood pressure below 120/80. Hypertension forces your heart to work harder to pump blood. Your arteries may become narrow or stiff. Having untreated or uncontrolled hypertension can cause heart attack, stroke, kidney disease, and other problems. RISK FACTORS Some risk factors for high blood pressure are controllable. Others are not.  Risk factors you cannot control include:   Race. You may be at higher risk if you are African American.  Age. Risk increases with age.  Gender. Men are at higher risk than women before age 45 years. After age 65, women are at higher risk than men. Risk factors you can control include:  Not getting enough exercise or physical activity.  Being overweight.  Getting too much fat, sugar, calories, or salt in your diet.  Drinking too much alcohol. SIGNS AND SYMPTOMS Hypertension does not usually cause signs or symptoms. Extremely high blood pressure (hypertensive crisis) may cause headache, anxiety, shortness of breath, and nosebleed. DIAGNOSIS To check if you have hypertension, your health care provider will measure your blood pressure while you are seated, with your arm held at the level of your heart. It should be measured at least twice using the same arm. Certain conditions can cause a difference in blood pressure between your right and left arms. A blood pressure reading that is higher than normal on one occasion does not mean that you need treatment. If  it is not clear whether you have high blood pressure, you may be asked to return on a different day to have your blood pressure checked again. Or, you may be asked to monitor your blood pressure at home for 1 or more weeks. TREATMENT Treating high blood pressure includes making lifestyle changes and possibly taking medicine. Living a healthy lifestyle can help lower high blood pressure. You may need to change some of your habits. Lifestyle changes may include:  Following the DASH diet. This diet is high in fruits, vegetables, and whole grains. It is low in salt, red meat, and added sugars.  Keep your sodium intake below 2,300 mg per day.  Getting at least 30-45 minutes of aerobic exercise at least 4 times per week.  Losing weight if necessary.  Not smoking.  Limiting alcoholic beverages.  Learning ways to reduce stress. Your health care provider may prescribe medicine if lifestyle changes are not enough to get your blood pressure under control, and if one of the following is true:  You are 18-59 years of age and your systolic blood pressure is above 140.  You are 60 years of age or older, and your systolic blood pressure is above 150.  Your diastolic blood pressure is above 90.  You have diabetes, and your systolic blood pressure is over 140 or your diastolic blood pressure is over 90.  You have kidney disease and your blood pressure is above 140/90.  You have heart disease and your blood pressure is above 140/90. Your personal target blood pressure may vary depending on your medical conditions, your age, and other factors. HOME CARE INSTRUCTIONS    Have your blood pressure rechecked as directed by your health care provider.   Take medicines only as directed by your health care provider. Follow the directions carefully. Blood pressure medicines must be taken as prescribed. The medicine does not work as well when you skip doses. Skipping doses also puts you at risk for  problems.  Do not smoke.   Monitor your blood pressure at home as directed by your health care provider. SEEK MEDICAL CARE IF:   You think you are having a reaction to medicines taken.  You have recurrent headaches or feel dizzy.  You have swelling in your ankles.  You have trouble with your vision. SEEK IMMEDIATE MEDICAL CARE IF:  You develop a severe headache or confusion.  You have unusual weakness, numbness, or feel faint.  You have severe chest or abdominal pain.  You vomit repeatedly.  You have trouble breathing. MAKE SURE YOU:   Understand these instructions.  Will watch your condition.  Will get help right away if you are not doing well or get worse.   This information is not intended to replace advice given to you by your health care provider. Make sure you discuss any questions you have with your health care provider.   Document Released: 01/05/2005 Document Revised: 05/22/2014 Document Reviewed: 10/28/2012 Elsevier Interactive Patient Education 2016 [employer]vier Inc.  

## 2014-12-31 NOTE — Telephone Encounter (Signed)
Called and spoke to patients sister, patients lab work came back and due to results Dr. Ronnald Ramp would like for her to go ahead and go to the hospital for renal failure. Patients sister understood and said she would be taking her to either [place] or [facility]

## 2014-12-31 NOTE — Telephone Encounter (Signed)
Pt called back.  Explained to pt the lab value and explained why it was important for her to go to the ER for evaluation and stabilization.  Pt agreed to comply.

## 2014-12-31 NOTE — Progress Notes (Signed)
Subjective:  Patient ID: Brenda Davis, female    DOB: 07-31-1965  Age: 49 y.o. MRN: [id number]  CC: Hypertension   HPI Brenda Davis presents for follow-up. The last time I saw her was almost 2 years ago when I ordered labs but appears that she did not do those labs. Since then she has been seeing someone else in the practice. She was seen here a week ago for blood pressure elevation. She was placed on Tribenzor. She has a history of edema from amlodipine. She returns today and complains that she has swelling in both ankles and feet. She complains of dyspnea on exertion, blurred vision, fatigue, weakness, headaches. She is status post bariatric surgery and has a history of anemia. She also has a history of vitamin deficiencies but tells me that she has not been taking her vitamin supplements.  Outpatient Prescriptions Prior to Visit  Medication Sig Dispense Refill  . acetaminophen (TYLENOL) 325 MG tablet Take 650 mg by mouth every 6 (six) hours as needed.    Marland Kitchen allopurinol (ZYLOPRIM) 100 MG tablet Take 1 tablet (100 mg total) by mouth daily. 90 tablet 3  . diclofenac sodium (VOLTAREN) 1 % GEL Apply 1 application topically 4 (four) times daily. 100 g 0  . ipratropium (ATROVENT) 0.06 % nasal spray Place 2 sprays into both nostrils 4 (four) times daily. 15 mL 1  . nebivolol (BYSTOLIC) 10 MG tablet TAKE 2 TABLETS (20 MG TOTAL) BY MOUTH DAILY. 180 tablet 3  . Olmesartan-Amlodipine-HCTZ 40-5-25 MG TABS 1 tab by mouth per day 90 tablet 3  . predniSONE (DELTASONE) 50 MG tablet Take 1 tablet (50 mg total) by mouth daily with breakfast. (Patient not taking: Reported on 12/20/2014) 3 tablet 0   No facility-administered medications prior to visit.    ROS Review of Systems  Constitutional: Positive for fatigue. Negative for fever, chills, diaphoresis, appetite change and unexpected weight change.  HENT: Negative for trouble swallowing.   Eyes: Positive for visual disturbance. Negative for  photophobia.  Respiratory: Positive for shortness of breath. Negative for cough, choking and chest tightness.   Cardiovascular: Positive for leg swelling. Negative for chest pain and palpitations.  Gastrointestinal: Negative.  Negative for nausea, vomiting, abdominal pain, diarrhea, constipation and blood in stool.  Endocrine: Negative.   Genitourinary: Negative.  Negative for dysuria, urgency, hematuria, decreased urine volume and difficulty urinating.  Musculoskeletal: Negative.  Negative for myalgias, back pain, joint swelling and arthralgias.  Skin: Positive for pallor. Negative for color change, rash and wound.  Allergic/Immunologic: Negative.   Neurological: Positive for dizziness and headaches. Negative for tremors, seizures, syncope, facial asymmetry, speech difficulty, weakness, light-headedness and numbness.  Hematological: Negative.  Negative for adenopathy. Does not bruise/bleed easily.  Psychiatric/Behavioral: Negative.     Objective:  BP 160/92 mmHg  Pulse 60  Temp(Src) 98 F (36.7 C) (Oral)  Resp 16  Ht 5' 5"  (1.651 m)  Wt 295 lb (133.811 kg)  BMI 49.09 kg/m2  SpO2 98%  BP Readings from Last 3 Encounters:  12/31/14 160/92  12/20/14 212/122  11/25/14 204/106    Wt Readings from Last 3 Encounters:  12/31/14 295 lb (133.811 kg)  12/20/14 249 lb (112.946 kg)  03/03/13 245 lb (111.131 kg)    Physical Exam  Constitutional: She is oriented to person, place, and time. No distress.  HENT:  Head: Normocephalic and atraumatic.  Mouth/Throat: Oropharynx is clear and moist. Mucous membranes are pale, not dry and not cyanotic. No oropharyngeal exudate.  Eyes: Conjunctivae are normal. Right eye exhibits no discharge. Left eye exhibits no discharge. No scleral icterus.  Neck: Normal range of motion. Neck supple. No JVD present. No tracheal deviation present. No thyromegaly present.  Cardiovascular: Normal rate, regular rhythm, normal heart sounds and intact distal pulses.   Exam reveals no gallop and no friction rub.   No murmur heard. EKG-normal sinus rhythm. T-wave flattening in 1 and V1. No Q waves and no LVH.  Pulmonary/Chest: Effort normal and breath sounds normal. No stridor. No respiratory distress. She has no wheezes. She has no rales. She exhibits no tenderness.  Abdominal: Soft. Bowel sounds are normal. She exhibits no distension and no mass. There is no tenderness. There is no rebound and no guarding.  Musculoskeletal: Normal range of motion. She exhibits edema (1+ pitting edema in both lower extremities). She exhibits no tenderness.  Lymphadenopathy:    She has no cervical adenopathy.  Neurological: She is oriented to person, place, and time.  Skin: Skin is warm and dry. No rash noted. She is not diaphoretic. No erythema. No pallor.  Psychiatric: She has a normal mood and affect. Her behavior is normal. Judgment normal.  Vitals reviewed.   Lab Results  Component Value Date   WBC 6.0 12/31/2014   HGB 8.1 Repeated and verified X2.* 12/31/2014   HCT 24.9 Repeated and verified X2.* 12/31/2014   PLT 307.0 12/31/2014   GLUCOSE 85 12/31/2014   CHOL 189 12/31/2014   TRIG 80.0 12/31/2014   HDL 50.40 12/31/2014   LDLDIRECT 161.6 11/28/2012   LDLCALC 123* 12/31/2014   ALT 13 12/31/2014   AST 10 12/31/2014   NA 143 12/31/2014   K 5.1 12/31/2014   CL 107 12/31/2014   CREATININE 13.33* 12/31/2014   BUN 102* 12/31/2014   CO2 19 12/31/2014   TSH 2.56 12/31/2014   HGBA1C 5.3 03/03/2013   MICROALBUR 159.00* 09/17/2009    No results found.  Assessment & Plan:   Brenda Davis was seen today for hypertension.  Diagnoses and all orders for this visit:  Proteinuria -     Urinalysis, Routine w reflex microscopic (not at [facility]); Future  Thiamine deficiency -     Vitamin B1; Future  Folate deficiency anemia -     Vitamin B12; Future -     Folate; Future  Pernicious anemia- her hematocrit is down daily 8.1, will check her vitamin levels  -      Vitamin B1; Future -     Vitamin B6; Future -     IBC panel; Future -     CBC with Differential/Platelet; Future -     Vitamin B12; Future -     Folate; Future -     Ferritin; Future  S/P laparoscopic sleeve gastrectomy -     VITAMIN D 25 Hydroxy (Vit-D Deficiency, Fractures); Future  HYPERTENSION, BENIGN ESSENTIAL- her blood pressure has improved but she is developing edema from amlodipine and renal failure. I initially thought I would treat her with losartan and hydrochlorothiazide. Subsequently, I have been informed that her BUN is 102 and her creatinine is 13. I have asked her to be seen in emergency room today for immediate evaluation and treatment of acute renal failure. -     losartan-hydrochlorothiazide (HYZAAR) 100-25 MG tablet; Take 1 tablet by mouth daily. -     TSH; Future -     Comprehensive metabolic panel; Future  Hyperglycemia -     Comprehensive metabolic panel; Future  DOE (dyspnea on exertion)- she  has nonspecific EKG changes, will check her cardiac enzymes for ischemia will screen her for pulmonary embolus and will also check a brain natruretic peptide to see if she has fluid overload -     EKG 12-Lead -     Troponin I; Future -     Cardiac panel; Future -     D-dimer, quantitative (not at [facility]); Future -     Brain natriuretic peptide; Future  Hyperlipidemia with target LDL less than 100 -     TSH; Future -     Lipid panel; Future  Hypokalemia -     Comprehensive metabolic panel; Future   I have discontinued Ms. Fransisca Connors predniSONE and Olmesartan-Amlodipine-HCTZ. I am also having her start on losartan-hydrochlorothiazide. Additionally, I am having her maintain her ipratropium, acetaminophen, allopurinol, diclofenac sodium, and nebivolol.  Meds ordered this encounter  Medications  . losartan-hydrochlorothiazide (HYZAAR) 100-25 MG tablet    Sig: Take 1 tablet by mouth daily.    Dispense:  90 tablet    Refill:  3     Follow-up: Return in about 3 weeks  (around 01/21/2015).  Scarlette Calico, MD

## 2014-12-31 NOTE — Assessment & Plan Note (Signed)
Her BUN is 102 and her creatinine is 13, her potassium level is slightly elevated at 5.1. She will be referred to the emergency room for urgent evaluation and treatment of renal failure.

## 2014-12-31 NOTE — Progress Notes (Signed)
Pre visit review using our clinic review tool, if applicable. No additional management support is needed unless otherwise documented below in the visit note. 

## 2014-12-31 NOTE — ED Notes (Signed)
Called without response from lobby  

## 2014-12-31 NOTE — ED Notes (Signed)
No answer from waiting room.

## 2014-12-31 NOTE — ED Notes (Addendum)
Patient referred by PCP to r/o renal failure. History of CKD, stage 3 kidney disease. Pt c/o generalized weakness, bilateral lower extremity swelling, urinary frequency, emesis x2, denies pain.

## 2015-01-01 ENCOUNTER — Inpatient Hospital Stay (HOSPITAL_COMMUNITY): Payer: 59

## 2015-01-01 ENCOUNTER — Inpatient Hospital Stay (HOSPITAL_COMMUNITY)
Admission: EM | Admit: 2015-01-01 | Discharge: 2015-01-07 | DRG: 674 | Disposition: A | Discharge status: Home / Self Care | Payer: 59 | Attending: Internal Medicine | Admitting: Internal Medicine

## 2015-01-01 ENCOUNTER — Encounter (HOSPITAL_COMMUNITY): Payer: Self-pay | Admitting: *Deleted

## 2015-01-01 DIAGNOSIS — I120 Hypertensive chronic kidney disease with stage 5 chronic kidney disease or end stage renal disease: Secondary | ICD-10-CM | POA: Diagnosis present

## 2015-01-01 DIAGNOSIS — E1122 Type 2 diabetes mellitus with diabetic chronic kidney disease: Secondary | ICD-10-CM | POA: Diagnosis present

## 2015-01-01 DIAGNOSIS — Z87891 Personal history of nicotine dependence: Secondary | ICD-10-CM | POA: Diagnosis not present

## 2015-01-01 DIAGNOSIS — Z79899 Other long term (current) drug therapy: Secondary | ICD-10-CM | POA: Diagnosis not present

## 2015-01-01 DIAGNOSIS — N186 End stage renal disease: Secondary | ICD-10-CM | POA: Diagnosis present

## 2015-01-01 DIAGNOSIS — E119 Type 2 diabetes mellitus without complications: Secondary | ICD-10-CM | POA: Diagnosis not present

## 2015-01-01 DIAGNOSIS — I455 Other specified heart block: Secondary | ICD-10-CM | POA: Clinically undetermined

## 2015-01-01 DIAGNOSIS — Z6841 Body Mass Index (BMI) 40.0 and over, adult: Secondary | ICD-10-CM

## 2015-01-01 DIAGNOSIS — I340 Nonrheumatic mitral (valve) insufficiency: Secondary | ICD-10-CM | POA: Diagnosis present

## 2015-01-01 DIAGNOSIS — Z9884 Bariatric surgery status: Secondary | ICD-10-CM | POA: Diagnosis not present

## 2015-01-01 DIAGNOSIS — M109 Gout, unspecified: Secondary | ICD-10-CM | POA: Diagnosis present

## 2015-01-01 DIAGNOSIS — E785 Hyperlipidemia, unspecified: Secondary | ICD-10-CM | POA: Diagnosis present

## 2015-01-01 DIAGNOSIS — I10 Essential (primary) hypertension: Secondary | ICD-10-CM | POA: Diagnosis not present

## 2015-01-01 DIAGNOSIS — F329 Major depressive disorder, single episode, unspecified: Secondary | ICD-10-CM | POA: Diagnosis present

## 2015-01-01 DIAGNOSIS — R001 Bradycardia, unspecified: Secondary | ICD-10-CM | POA: Diagnosis not present

## 2015-01-01 DIAGNOSIS — Z992 Dependence on renal dialysis: Secondary | ICD-10-CM

## 2015-01-01 DIAGNOSIS — E875 Hyperkalemia: Secondary | ICD-10-CM | POA: Diagnosis present

## 2015-01-01 DIAGNOSIS — N185 Chronic kidney disease, stage 5: Secondary | ICD-10-CM | POA: Diagnosis present

## 2015-01-01 DIAGNOSIS — Z833 Family history of diabetes mellitus: Secondary | ICD-10-CM

## 2015-01-01 DIAGNOSIS — E1165 Type 2 diabetes mellitus with hyperglycemia: Secondary | ICD-10-CM | POA: Diagnosis present

## 2015-01-01 DIAGNOSIS — N179 Acute kidney failure, unspecified: Secondary | ICD-10-CM

## 2015-01-01 DIAGNOSIS — R079 Chest pain, unspecified: Secondary | ICD-10-CM

## 2015-01-01 DIAGNOSIS — Z888 Allergy status to other drugs, medicaments and biological substances status: Secondary | ICD-10-CM

## 2015-01-01 DIAGNOSIS — N19 Unspecified kidney failure: Secondary | ICD-10-CM | POA: Diagnosis present

## 2015-01-01 DIAGNOSIS — Z419 Encounter for procedure for purposes other than remedying health state, unspecified: Secondary | ICD-10-CM

## 2015-01-01 DIAGNOSIS — D631 Anemia in chronic kidney disease: Secondary | ICD-10-CM | POA: Diagnosis present

## 2015-01-01 DIAGNOSIS — R0600 Dyspnea, unspecified: Secondary | ICD-10-CM | POA: Diagnosis not present

## 2015-01-01 DIAGNOSIS — Z95828 Presence of other vascular implants and grafts: Secondary | ICD-10-CM

## 2015-01-01 DIAGNOSIS — N183 Chronic kidney disease, stage 3 (moderate): Secondary | ICD-10-CM | POA: Diagnosis not present

## 2015-01-01 DIAGNOSIS — Z8249 Family history of ischemic heart disease and other diseases of the circulatory system: Secondary | ICD-10-CM

## 2015-01-01 LAB — CBC WITH DIFFERENTIAL/PLATELET
Basophils Absolute: 0 10*3/uL (ref 0.0–0.1)
Basophils Relative: 1 %
Eosinophils Absolute: 1.5 10*3/uL — ABNORMAL HIGH (ref 0.0–0.7)
Eosinophils Relative: 24 %
HCT: 24.2 % — ABNORMAL LOW (ref 36.0–46.0)
Hemoglobin: 7.9 g/dL — ABNORMAL LOW (ref 12.0–15.0)
Lymphocytes Relative: 26 %
Lymphs Abs: 1.6 10*3/uL (ref 0.7–4.0)
MCH: 30.6 pg (ref 26.0–34.0)
MCHC: 32.6 g/dL (ref 30.0–36.0)
MCV: 93.8 fL (ref 78.0–100.0)
Monocytes Absolute: 0.7 10*3/uL (ref 0.1–1.0)
Monocytes Relative: 11 %
Neutro Abs: 2.4 10*3/uL (ref 1.7–7.7)
Neutrophils Relative %: 38 %
Platelets: 298 10*3/uL (ref 150–400)
RBC: 2.58 MIL/uL — ABNORMAL LOW (ref 3.87–5.11)
RDW: 13.8 % (ref 11.5–15.5)
WBC: 6.1 10*3/uL (ref 4.0–10.5)

## 2015-01-01 LAB — BASIC METABOLIC PANEL WITH GFR
Anion gap: 16 — ABNORMAL HIGH (ref 5–15)
BUN: 107 mg/dL — ABNORMAL HIGH (ref 6–20)
CO2: 18 mmol/L — ABNORMAL LOW (ref 22–32)
Calcium: 7.3 mg/dL — ABNORMAL LOW (ref 8.9–10.3)
Chloride: 108 mmol/L (ref 101–111)
Creatinine, Ser: 14.16 mg/dL — ABNORMAL HIGH (ref 0.44–1.00)
GFR calc Af Amer: 3 mL/min — ABNORMAL LOW
GFR calc non Af Amer: 3 mL/min — ABNORMAL LOW
Glucose, Bld: 87 mg/dL (ref 65–99)
Potassium: 5.3 mmol/L — ABNORMAL HIGH (ref 3.5–5.1)
Sodium: 142 mmol/L (ref 135–145)

## 2015-01-01 LAB — URINALYSIS, ROUTINE W REFLEX MICROSCOPIC
Bilirubin Urine: NEGATIVE
Glucose, UA: 100 mg/dL — AB
Ketones, ur: NEGATIVE mg/dL
Leukocytes, UA: NEGATIVE
Nitrite: NEGATIVE
Protein, ur: >300 mg/dL — AB
Specific Gravity, Urine: 1.015 (ref 1.005–1.030)
pH: 6 (ref 5.0–8.0)

## 2015-01-01 LAB — URINE MICROSCOPIC-ADD ON

## 2015-01-01 LAB — D-DIMER, QUANTITATIVE: D-Dimer, Quant: 1.96 ug/mL-FEU — ABNORMAL HIGH (ref 0.00–0.48)

## 2015-01-01 MED ORDER — HEPARIN SODIUM (PORCINE) 5000 UNIT/ML IJ SOLN
5000.0000 [IU] | Freq: Three times a day (TID) | INTRAMUSCULAR | Status: DC
  Administered 2015-01-02 (×3): 5000 [IU] via SUBCUTANEOUS
  Filled 2015-01-01 (×4): qty 1

## 2015-01-01 MED ORDER — NEBIVOLOL HCL 10 MG PO TABS
10.0000 mg | ORAL_TABLET | Freq: Two times a day (BID) | ORAL | Status: DC
  Administered 2015-01-02 – 2015-01-04 (×4): 10 mg via ORAL
  Filled 2015-01-01 (×10): qty 1

## 2015-01-01 NOTE — Telephone Encounter (Signed)
Pt called in and said that she went to ED last night.  She said that they didn't understand why she was there?  She is requesting a call back from nurse.

## 2015-01-01 NOTE — ED Provider Notes (Signed)
CSN: [id number]     Arrival date & time 01/01/15  1508 History   First MD Initiated Contact with Patient 01/01/15 1946     Chief Complaint  Patient presents with  . Abnormal Lab    HPI  Patient is a pleasant 49 y/o AAF with hypertension and stage 3 CKD referred here by her PCP for elevated BUN (102) and creatinine (13.33).  Patient shares she has never undergone dialysis.  She endorses fatigue, SOB, vomiting, headaches, and blurry vision that have all been present over the past week.  Her BP has been uncontrolled since her gastric bypass surgery one year ago.  BP reading at home was 220/200 two weeks ago.  Her PCP has been managing her BP regimen and had just switched her to losartan today.     Past Medical History  Diagnosis Date  . Hypertension   . Diabetes mellitus   . Gout   . Depression   . Gout   . Hyperlipidemia   . Blood transfusion without reported diagnosis   . Morbid obesity ([place])   . Chronic kidney disease     STAGE 3 CHRONIC KIDNEY DISEASE SECONDARY TO DIABETIC GLOMERULOSCLEROSIS AND UNCONTROLLED HYPERTENSION - PER OFFICE NOTES DR. [facility].  Marland Kitchen Headache(784.0)     CHRONIC H/A'S  . Pain     LEFT SHOULDER PAIN - WAS SEEN AT AN URGENT CARE - GIVEN SLING FOR COMFORT AND TOLD ROM AS TOLERATED.   Past Surgical History  Procedure Laterality Date  . Cesarean section  1988, 1993  . Carpal tunnel release  2008  . Laparoscopic gastric sleeve resection N/A 07/19/2012    Procedure: LAPAROSCOPIC SLEEVE GASTRECTOMY with EGD;  Surgeon: Madilyn Hook, DO;  Location: [facility];  Service: General;  Laterality: N/A;  laparoscopic sleeve gastrectomy with EGD  . Upper gi endoscopy N/A 07/19/2012    Procedure: UPPER GI ENDOSCOPY;  Surgeon: Madilyn Hook, DO;  Location: [facility];  Service: General;  Laterality: N/A;  . Esophagogastroduodenoscopy N/A 09/04/2012    Procedure: ESOPHAGOGASTRODUODENOSCOPY (EGD);  Surgeon: Shann Medal, MD;  Location: [facility];  Service: General;   Laterality: N/A;  PF  . Esophagogastroduodenoscopy (egd) with esophageal dilation N/A 09/29/2012    Procedure: ESOPHAGOGASTRODUODENOSCOPY (EGD) WITH ESOPHAGEAL DILATION;  Surgeon: Milus Banister, MD;  Location: [facility];  Service: Endoscopy;  Laterality: N/A;   Family History  Problem Relation Age of Onset  . Hypertension Mother   . Diabetes Father   . Arthritis Other   . Hyperlipidemia Other   . Cancer Neg Hx   . Heart disease Neg Hx   . Kidney disease Neg Hx   . Stroke Neg Hx    Social History  Substance Use Topics  . Smoking status: Former Smoker -- 20 years    Types: Cigarettes  . Smokeless tobacco: Never Used     Comment: QUIT SMOKING ABOUT 2009  . Alcohol Use: No   OB History    No data available     Review of Systems All other systems negative except as documented in the HPI. All pertinent positives and negatives as reviewed in the HPI.    Allergies  Amlodipine; Clonidine derivatives; and Welchol  Home Medications   Prior to Admission medications   Medication Sig Start Date End Date Taking? Authorizing Provider  acetaminophen (TYLENOL) 325 MG tablet Take 650 mg by mouth every 6 (six) hours as needed.    Historical Provider, MD  allopurinol (ZYLOPRIM) 100 MG tablet Take  1 tablet (100 mg total) by mouth daily. 09/13/14   Biagio Borg, MD  diclofenac sodium (VOLTAREN) 1 % GEL Apply 1 application topically 4 (four) times daily. 11/25/14   Melynda Ripple, MD  ipratropium (ATROVENT) 0.06 % nasal spray Place 2 sprays into both nostrils 4 (four) times daily. 04/10/14   Gregor Hams, MD  losartan-hydrochlorothiazide (HYZAAR) 100-25 MG tablet Take 1 tablet by mouth daily. 12/31/14   [name], MD  nebivolol (BYSTOLIC) 10 MG tablet TAKE 2 TABLETS (20 MG TOTAL) BY MOUTH DAILY. 12/20/14   Biagio Borg, MD   BP 180/80 mmHg  Pulse 67  Temp(Src) 98.2 F (36.8 C) (Oral)  Resp 16  SpO2 98%   Physical Exam  Constitutional: She is oriented to person, place, and time.  She appears well-developed and well-nourished. No distress.  HENT:  Head: Normocephalic and atraumatic.  Mouth/Throat: Oropharynx is clear and moist.  Eyes: Pupils are equal, round, and reactive to light.  Neck: Normal range of motion. Neck supple.  Cardiovascular: Normal rate, regular rhythm and normal heart sounds.   Pulmonary/Chest: Effort normal and breath sounds normal. No respiratory distress.  Abdominal: Soft. Bowel sounds are normal. There is no CVA tenderness.  Musculoskeletal: She exhibits edema.  2+ pitting edema bilaterally to mid-shin   Neurological: She is alert and oriented to person, place, and time. She exhibits normal muscle tone. Coordination normal.  Skin: Skin is warm and dry. She is not diaphoretic.  Psychiatric: She has a normal mood and affect. Her behavior is normal.  Nursing note and vitals reviewed.   ED Course  Procedures (including critical care time) Labs Review Labs Reviewed  CBC WITH DIFFERENTIAL/PLATELET - Abnormal; Notable for the following:    RBC 2.58 (*)    Hemoglobin 7.9 (*)    HCT 24.2 (*)    Eosinophils Absolute 1.5 (*)    All other components within normal limits  BASIC METABOLIC PANEL - Abnormal; Notable for the following:    Potassium 5.3 (*)    CO2 18 (*)    BUN 107 (*)    Creatinine, Ser 14.16 (*)    Calcium 7.3 (*)    GFR calc non Af Amer 3 (*)    GFR calc Af Amer 3 (*)    Anion gap 16 (*)    All other components within normal limits  URINALYSIS, ROUTINE W REFLEX MICROSCOPIC (NOT AT [facility])    Imaging Review No results found. I have personally reviewed and evaluated these images and lab results as part of my medical decision-making.   EKG Interpretation None      Spoke with the [facility] who will admit the patient for further evaluation and care   Dalia Heading, PA-C 01/01/15 [place], MD 01/01/15 [phone]

## 2015-01-01 NOTE — ED Notes (Signed)
Attempted to call report to [facility], they will call back when they have a bed assigned, per Network engineer.

## 2015-01-01 NOTE — ED Provider Notes (Signed)
Complains of generalized weakness and dyspnea on exertion or approximate the past 3 weeks. Patient is currently no distress. Speaks in paragraphs. No respiratory distress. Results for orders placed or performed during the hospital encounter of 01/01/15  CBC with Differential  Result Value Ref Range   WBC 6.1 4.0 - 10.5 K/uL   RBC 2.58 (L) 3.87 - 5.11 MIL/uL   Hemoglobin 7.9 (L) 12.0 - 15.0 g/dL   HCT 24.2 (L) 36.0 - 46.0 %   MCV 93.8 78.0 - 100.0 fL   MCH 30.6 26.0 - 34.0 pg   MCHC 32.6 30.0 - 36.0 g/dL   RDW 13.8 11.5 - 15.5 %   Platelets 298 150 - 400 K/uL   Neutrophils Relative % 38 %   Neutro Abs 2.4 1.7 - 7.7 K/uL   Lymphocytes Relative 26 %   Lymphs Abs 1.6 0.7 - 4.0 K/uL   Monocytes Relative 11 %   Monocytes Absolute 0.7 0.1 - 1.0 K/uL   Eosinophils Relative 24 %   Eosinophils Absolute 1.5 (H) 0.0 - 0.7 K/uL   Basophils Relative 1 %   Basophils Absolute 0.0 0.0 - 0.1 K/uL  Basic metabolic panel  Result Value Ref Range   Sodium 142 135 - 145 mmol/L   Potassium 5.3 (H) 3.5 - 5.1 mmol/L   Chloride 108 101 - 111 mmol/L   CO2 18 (L) 22 - 32 mmol/L   Glucose, Bld 87 65 - 99 mg/dL   BUN 107 (H) 6 - 20 mg/dL   Creatinine, Ser 14.16 (H) 0.44 - 1.00 mg/dL   Calcium 7.3 (L) 8.9 - 10.3 mg/dL   GFR calc non Af Amer 3 (L) >60 mL/min   GFR calc Af Amer 3 (L) >60 mL/min   Anion gap 16 (H) 5 - 15  Urinalysis, Routine w reflex microscopic (not at [facility])  Result Value Ref Range   Color, Urine YELLOW YELLOW   APPearance CLOUDY (A) CLEAR   Specific Gravity, Urine 1.015 1.005 - 1.030   pH 6.0 5.0 - 8.0   Glucose, UA 100 (A) NEGATIVE mg/dL   Hgb urine dipstick SMALL (A) NEGATIVE   Bilirubin Urine NEGATIVE NEGATIVE   Ketones, ur NEGATIVE NEGATIVE mg/dL   Protein, ur >300 (A) NEGATIVE mg/dL   Nitrite NEGATIVE NEGATIVE   Leukocytes, UA NEGATIVE NEGATIVE  Urine microscopic-add on  Result Value Ref Range   Squamous Epithelial / LPF 6-30 (A) NONE SEEN   WBC, UA 0-5 0 - 5 WBC/hpf   RBC /  HPF 0-5 0 - 5 RBC/hpf   Bacteria, UA RARE (A) NONE SEEN   No results found. Pt notedto be in acute renal failure  Orlie Dakin, MD 01/01/15 [phone]

## 2015-01-01 NOTE — Telephone Encounter (Signed)
I recommended that she be admitted for an urgent evaluation of her renal failure Did she see a doctor yesterday?

## 2015-01-01 NOTE — ED Notes (Signed)
Pt was told to come to ED due to possible kidney failure. Pt had elevated creatinine at her doctors office yesterday. Pt states she feels tired, nauseas and feels she is retaining fluid.

## 2015-01-01 NOTE — Telephone Encounter (Signed)
Patient called back in. She went to the ed yesterday, and bloodwork was taken. Before seeing a provider, she left due to the wait time. She stated that they did not seem to understand why she was there. i spoke with dr Ronnald Ramp. He stated that she needs to go back to the ed and see a provider no matter how long it takes. Even if the results they took did not come up critical, he is confident that she will be admitted. She understood that she needs to go back to the ed and tell them that she is there regarding renal failure and that dr Ronnald Ramp fears that she will enter renal failure. She had questions on FMLA. i advised her not to worry about that at this point,. Once she has a better idea of her plan of care, then we will address the FMLA with a more complete picture.

## 2015-01-01 NOTE — H&P (Signed)
[facility] History and Physical  Brenda Davis WSF:[id number] DOB: 07-09-1965 DOA: 01/01/2015  Referring physician: EDP PCP: Scarlette Calico, MD   Chief Complaint: Kidney Failure   HPI: Brenda Davis is a 49 y.o. female with h/o DM2 not on meds for this following a gastric bypass procedure, HTN, gout, CKD that appears to have been stage 4 back in 2014 before she stopped seeing nephrology.  Last lab draw was in Feb 2015 and creatinine at that time was 2.9.  Patient was seen by her PCP for first time in 2 years 2 days ago with complaint of peripheral edema, malaise, and nausea x 1 month.  He ordered lab work on the patient which came back with creatinine of 13 and BUN > 100.  Patient has been having gout flare ups in recent months.  Review of Systems: Systems reviewed.  As above, otherwise negative  Past Medical History  Diagnosis Date  . Hypertension   . Diabetes mellitus   . Gout   . Depression   . Gout   . Hyperlipidemia   . Blood transfusion without reported diagnosis   . Morbid obesity ([place])   . Chronic kidney disease     STAGE 3 CHRONIC KIDNEY DISEASE SECONDARY TO DIABETIC GLOMERULOSCLEROSIS AND UNCONTROLLED HYPERTENSION - PER OFFICE NOTES DR. Florene Glen [facility].  Marland Kitchen Headache(784.0)     CHRONIC H/A'S  . Pain     LEFT SHOULDER PAIN - WAS SEEN AT AN URGENT CARE - GIVEN SLING FOR COMFORT AND TOLD ROM AS TOLERATED.   Past Surgical History  Procedure Laterality Date  . Cesarean section  1988, 1993  . Carpal tunnel release  2008  . Laparoscopic gastric sleeve resection N/A 07/19/2012    Procedure: LAPAROSCOPIC SLEEVE GASTRECTOMY with EGD;  Surgeon: Madilyn Hook, DO;  Location: [facility];  Service: General;  Laterality: N/A;  laparoscopic sleeve gastrectomy with EGD  . Upper gi endoscopy N/A 07/19/2012    Procedure: UPPER GI ENDOSCOPY;  Surgeon: Madilyn Hook, DO;  Location: [facility];  Service: General;  Laterality: N/A;  . Esophagogastroduodenoscopy N/A 09/04/2012     Procedure: ESOPHAGOGASTRODUODENOSCOPY (EGD);  Surgeon: Shann Medal, MD;  Location: [facility];  Service: General;  Laterality: N/A;  PF  . Esophagogastroduodenoscopy (egd) with esophageal dilation N/A 09/29/2012    Procedure: ESOPHAGOGASTRODUODENOSCOPY (EGD) WITH ESOPHAGEAL DILATION;  Surgeon: Milus Banister, MD;  Location: [facility];  Service: Endoscopy;  Laterality: N/A;   Social History:  reports that she has quit smoking. Her smoking use included Cigarettes. She quit after 20 years of use. She has never used smokeless tobacco. She reports that she does not drink alcohol or use illicit drugs.  Allergies  Allergen Reactions  . Amlodipine     edema  . Clonidine Derivatives Swelling  . Welchol [Colesevelam Hcl] Nausea Only    Family History  Problem Relation Age of Onset  . Hypertension Mother   . Diabetes Father   . Arthritis Other   . Hyperlipidemia Other   . Cancer Neg Hx   . Heart disease Neg Hx   . Kidney disease Neg Hx   . Stroke Neg Hx      Prior to Admission medications   Medication Sig Start Date End Date Taking? Authorizing Provider  allopurinol (ZYLOPRIM) 100 MG tablet Take 1 tablet (100 mg total) by mouth daily. Patient taking differently: Take 100 mg by mouth daily as needed (for gout flare ups).  09/13/14  Yes Biagio Borg, MD  Aspirin-Acetaminophen-Caffeine (GOODY HEADACHE PO) Take 1 packet by mouth every 4 (four) hours as needed (for pain).   Yes Historical Provider, MD  losartan-hydrochlorothiazide (HYZAAR) 100-25 MG tablet Take 1 tablet by mouth daily. 12/31/14  Yes [name], MD  nebivolol (BYSTOLIC) 10 MG tablet TAKE 2 TABLETS (20 MG TOTAL) BY MOUTH DAILY. Patient taking differently: Take 10 mg by mouth 2 (two) times daily.  12/20/14  Yes Biagio Borg, MD   Physical Exam: Filed Vitals:   01/01/15 1531 01/01/15 2007  BP: 191/89 180/80  Pulse: 69 67  Temp: 98.2 F (36.8 C) 98.2 F (36.8 C)  Resp: 20 16    BP 180/80 mmHg  Pulse 67   Temp(Src) 98.2 F (36.8 C) (Oral)  Resp 16  SpO2 98%  General Appearance:    Alert, oriented, no distress, appears stated age  Head:    Normocephalic, atraumatic  Eyes:    PERRL, EOMI, sclera non-icteric        Nose:   Nares without drainage or epistaxis. Mucosa, turbinates normal  Throat:   Moist mucous membranes. Oropharynx without erythema or exudate.  Neck:   Supple. No carotid bruits.  No thyromegaly.  No lymphadenopathy.   Back:     No CVA tenderness, no spinal tenderness  Lungs:     Clear to auscultation bilaterally, without wheezes, rhonchi or rales  Chest wall:    No tenderness to palpitation  Heart:    Regular rate and rhythm without murmurs, gallops, rubs  Abdomen:     Soft, non-tender, nondistended, normal bowel sounds, no organomegaly  Genitalia:    deferred  Rectal:    deferred  Extremities:   No clubbing, cyanosis or edema.  Pulses:   2+ and symmetric all extremities  Skin:   Skin color, texture, turgor normal, no rashes or lesions  Lymph nodes:   Cervical, supraclavicular, and axillary nodes normal  Neurologic:   CNII-XII intact. Normal strength, sensation and reflexes      throughout    Labs on Admission:  Basic Metabolic Panel:  Recent Labs Lab 12/31/14 1510 12/31/14 2004 01/01/15 1723  NA 143 143 142  K 5.1 4.8 5.3*  CL 107 110 108  CO2 19 18* 18*  GLUCOSE 85 95 87  BUN 102* 76* 107*  CREATININE 13.33* 13.88* 14.16*  CALCIUM 7.4* 7.4* 7.3*   Liver Function Tests:  Recent Labs Lab 12/31/14 1510 12/31/14 2004  AST 10 11*  ALT 13 16  ALKPHOS 79 75  BILITOT 0.3 0.6  PROT 6.4 6.8  ALBUMIN 3.4* 3.4*    Recent Labs Lab 12/31/14 2004  LIPASE 36   No results for input(s): AMMONIA in the last 168 hours. CBC:  Recent Labs Lab 12/31/14 1510 12/31/14 2004 01/01/15 1723  WBC 6.0 6.6 6.1  NEUTROABS 2.7  --  2.4  HGB 8.1 Repeated and verified X2.* 7.7* 7.9*  HCT 24.9 Repeated and verified X2.* 23.8* 24.2*  MCV 93.6 94.1 93.8  PLT 307.0  297 298   Cardiac Enzymes:  Recent Labs Lab 12/31/14 1510  CKTOTAL 146  CKMB 3.1    BNP (last 3 results)  Recent Labs  12/31/14 1510  PROBNP 477.0*   CBG: No results for input(s): GLUCAP in the last 168 hours.  Radiological Exams on Admission: No results found.  EKG: Independently reviewed.  Assessment/Plan Principal Problem:   Kidney failure Active Problems:   HYPERTENSION, BENIGN ESSENTIAL   CKD (chronic kidney disease) stage 5, GFR less than 15 ml/min (  [place])   DM2 (diabetes mellitus, type 2) ([place])   Kidney failure - This most likely represents progression of the patients known CKD stage 4 that was her baseline (based on numerous labs in 2014 with GFR of less than 30), las drawn as of Feb 2015, to CKD stage 5.  This occurs in the context of known proteinuria from HTN and diabetic kidney disease.  I note that she has even without having labs drawn had increasing gout flares over recent months.  It sounds like she has been having fluid retention and symptoms of uremia for the past month or so (fatigue, nausea).  I will go ahead and hold her nephrotoxic NSAIDS, ACEi and HCTZ.  With BP of 180/80 today in ED, she is certainly not pre-renal  She is still making urine, will go ahead and get renal US though but dosent sound obstructive  Spoke with Dr. Posey Pronto who after hearing story, HPI, etc, agrees that there is almost no chance of renal recovery at this point and probably represents progression of CKD to ESRD.  He will see patient at [facility] tomorrow, likely will end up on dialysis.  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    Code Status: Full  Family Communication: Family at bedside Disposition Plan: Ad[facility] to [facility]   Time spent: 24 min  Cecilia Vancleve M. [facility] Pager [phone]  If 7AM-7PM, please contact the day team taking care of the patient [email or web address] Password [facility] 01/01/2015, 9:21 PM

## 2015-01-02 ENCOUNTER — Inpatient Hospital Stay (HOSPITAL_COMMUNITY): Payer: 59

## 2015-01-02 DIAGNOSIS — N183 Chronic kidney disease, stage 3 (moderate): Secondary | ICD-10-CM

## 2015-01-02 DIAGNOSIS — N185 Chronic kidney disease, stage 5: Secondary | ICD-10-CM | POA: Diagnosis present

## 2015-01-02 DIAGNOSIS — N186 End stage renal disease: Secondary | ICD-10-CM

## 2015-01-02 LAB — CBC
HCT: 22.4 % — ABNORMAL LOW (ref 36.0–46.0)
Hemoglobin: 7.1 g/dL — ABNORMAL LOW (ref 12.0–15.0)
MCH: 30 pg (ref 26.0–34.0)
MCHC: 31.7 g/dL (ref 30.0–36.0)
MCV: 94.5 fL (ref 78.0–100.0)
Platelets: 253 10*3/uL (ref 150–400)
RBC: 2.37 MIL/uL — ABNORMAL LOW (ref 3.87–5.11)
RDW: 13.4 % (ref 11.5–15.5)
WBC: 5.3 10*3/uL (ref 4.0–10.5)

## 2015-01-02 LAB — GLUCOSE, CAPILLARY
Glucose-Capillary: 87 mg/dL (ref 65–99)
Glucose-Capillary: 81 mg/dL (ref 65–99)

## 2015-01-02 LAB — TROPONIN I: Troponin I: <0.03 ng/mL (ref <0.031)

## 2015-01-02 LAB — BASIC METABOLIC PANEL
Anion gap: 15 (ref 5–15)
BUN: 104 mg/dL — ABNORMAL HIGH (ref 6–20)
CO2: 17 mmol/L — ABNORMAL LOW (ref 22–32)
Calcium: 7.1 mg/dL — ABNORMAL LOW (ref 8.9–10.3)
Chloride: 111 mmol/L (ref 101–111)
Creatinine, Ser: 14.19 mg/dL — ABNORMAL HIGH (ref 0.44–1.00)
GFR calc Af Amer: 3 mL/min — ABNORMAL LOW (ref >60)
GFR calc non Af Amer: 3 mL/min — ABNORMAL LOW (ref >60)
Glucose, Bld: 92 mg/dL (ref 65–99)
Potassium: 4.5 mmol/L (ref 3.5–5.1)
Sodium: 143 mmol/L (ref 135–145)

## 2015-01-02 LAB — CREATININE, URINE, RANDOM: Creatinine, Urine: 71.6 mg/dL

## 2015-01-02 LAB — PHOSPHORUS: Phosphorus: 10.1 mg/dL — ABNORMAL HIGH (ref 2.5–4.6)

## 2015-01-02 LAB — SODIUM, URINE, RANDOM: Sodium, Ur: 91 mmol/L

## 2015-01-02 MED ORDER — ASPIRIN 81 MG PO CHEW
324.0000 mg | CHEWABLE_TABLET | Freq: Once | ORAL | Status: AC
  Administered 2015-01-02: 324 mg via ORAL
  Filled 2015-01-02: qty 4

## 2015-01-02 MED ORDER — AMLODIPINE BESYLATE 5 MG PO TABS
5.0000 mg | ORAL_TABLET | Freq: Every day | ORAL | Status: DC
  Administered 2015-01-02: 5 mg via ORAL
  Filled 2015-01-02: qty 1

## 2015-01-02 MED ORDER — FUROSEMIDE 10 MG/ML IJ SOLN
80.0000 mg | Freq: Two times a day (BID) | INTRAMUSCULAR | Status: DC
  Administered 2015-01-02 – 2015-01-06 (×9): 80 mg via INTRAVENOUS
  Filled 2015-01-02 (×9): qty 8

## 2015-01-02 MED ORDER — AMLODIPINE BESYLATE 5 MG PO TABS: 5.0000 mg | ORAL_TABLET | Freq: Every day | ORAL | Status: DC

## 2015-01-02 MED ORDER — DEXTROSE 5 % IV SOLN
1.5000 g | INTRAVENOUS | Status: AC
  Administered 2015-01-03: 1.5 g via INTRAVENOUS
  Filled 2015-01-02 (×2): qty 1.5

## 2015-01-02 MED ORDER — MORPHINE SULFATE (PF) 2 MG/ML IV SOLN
2.0000 mg | Freq: Once | INTRAVENOUS | Status: AC
  Administered 2015-01-02: 2 mg via INTRAVENOUS
  Filled 2015-01-02: qty 1

## 2015-01-02 MED ORDER — ONDANSETRON HCL 4 MG/2ML IJ SOLN: 4.0000 mg | Freq: Four times a day (QID) | INTRAMUSCULAR | Status: DC | PRN

## 2015-01-02 MED ORDER — DEXTROSE 5 % IV SOLN
1.5000 g | INTRAVENOUS | Status: DC
  Filled 2015-01-02: qty 1.5

## 2015-01-02 MED ORDER — HYDRALAZINE HCL 20 MG/ML IJ SOLN
10.0000 mg | Freq: Four times a day (QID) | INTRAMUSCULAR | Status: DC | PRN
  Administered 2015-01-02: 10 mg via INTRAVENOUS
  Filled 2015-01-02: qty 1

## 2015-01-02 MED ORDER — INSULIN ASPART 100 UNIT/ML ~~LOC~~ SOLN: 0.0000 [IU] | Freq: Three times a day (TID) | SUBCUTANEOUS | Status: DC

## 2015-01-02 NOTE — Progress Notes (Signed)
Right  Upper Extremity Vein Map    Cephalic  Segment Diameter Depth Comment  1. Axilla 4.59mm 14.65mm   2. Mid upper arm 4.38mm 8.58mm   3. Above [facility] 4.86mm 5.89mm   4. In [facility] 6.15mm 3.64mm   5. Below AC 3.70mm 6.33mm Branch  6. Mid forearm 2.54mm 4.36mm   7. Wrist 2.48mm 3.40mm Branch   mm mm    mm mm    mm mm    Basilic Not visualized  Left Upper Extremity Vein Map    Cephalic  Segment Diameter Depth Comment  1. Axilla 3.39mm 16.74mm   2. Mid upper arm 4.19mm 9.76mm   3. Above [facility] 5.32mm 7.86mm   4. In [facility] 6.64mm 5.25mm   5. Below AC 3.59mm 6.52mm Branch  6. Mid forearm 2.40mm 4.31mm   7. Wrist 1.82mm 3.65mm                   Basilic Not visualized  [id number] 4:01 PM Maudry Mayhew, RVT, RDCS, RDMS

## 2015-01-02 NOTE — Consult Note (Signed)
Reason for Consult:Progressive CKD Referring Physician: Grandville Silos, MD  CARIANNA LEVELL is an 49 y.o. female.  HPI: Pt is a 49yo AAF with PMH sig for morbid obesity s/p gastric sleeve, advanced CKD stage 3-4 due to longstanding, poorly-controlled DM and HTN, who was lost to follow up from Nephrology care for the last 2 years and presented to her PCP with worsening BP, lower extremity edema, malaise, as well as N/V on 12/21/14.  She was taking BenicarHCT and was started on tribenzor to help with BP control and had labs drawn on 12/31/14 at the outpatient office which was remarkable for a Scr of 13.33 and instructed to go to [facility] ED for further evaluation and admission but left ED due to long wait time.  She was then sent back on 01/01/15 and admitted for further evaluation of her progressive CKD.  She was known to have stage 4 CKD in 2014 but no labs since February of 2015 (she reports that it was hard to schedule appointments due to her work schedule).  She admits to having poorly controlled DM and HTN over the last several years and noted SBP's in the 200's at the end of November/early December.  She denies family history of kidney disease but was aware of her own diagnosis but thought that her kidney function was improving after she underwent the gastric sleeve procedure.   We have been asked to help manage her progressive CKD and prepare her for hemodialysis due to the development of uremic symptoms.  Her trend in Scr is seen below.  Of note, she had been taking NSAIDs along with ARB therapy, both have been discontinued.  Trend in Creatinine:  CREATININE, SER  Date/Time Value Ref Range Status  01/01/2015 05:23 PM 14.16* 0.44 - 1.00 mg/dL Final  12/31/2014 08:04 PM 13.88* 0.44 - 1.00 mg/dL Final  12/31/2014 03:10 PM 13.33* 0.40 - 1.20 mg/dL Final  03/03/2013 04:26 PM 2.9* 0.4 - 1.2 mg/dL Final  11/28/2012 10:42 AM 2.3* 0.4 - 1.2 mg/dL Final  09/27/2012 11:35 AM 2.08* 0.50 - 1.10 mg/dL Final   09/05/2012 03:55 AM 2.25* 0.50 - 1.10 mg/dL Final  09/04/2012 03:55 AM 2.37* 0.50 - 1.10 mg/dL Final  09/03/2012 04:30 AM 2.43* 0.50 - 1.10 mg/dL Final  09/02/2012 10:11 AM 2.22* 0.50 - 1.10 mg/dL Final  08/22/2012 10:20 AM 2.59* 0.50 - 1.10 mg/dL Final  08/20/2012 04:26 AM 2.27* 0.50 - 1.10 mg/dL Final  07/22/2012 04:39 AM 2.98* 0.50 - 1.10 mg/dL Final  07/21/2012 03:45 AM 3.15* 0.50 - 1.10 mg/dL Final  07/20/2012 09:20 AM 2.92* 0.50 - 1.10 mg/dL Final  07/20/2012 04:15 AM 2.84* 0.50 - 1.10 mg/dL Final  07/15/2012 02:00 PM 3.10* 0.50 - 1.10 mg/dL Final  05/23/2012 08:36 AM 3.0* 0.4 - 1.2 mg/dL Final  03/21/2012 09:52 AM 2.28* 0.50 - 1.10 mg/dL Final  12/30/2011 10:49 AM 2.2* 0.4 - 1.2 mg/dL Final  12/07/2011 05:16 PM 2.3* 0.4 - 1.2 mg/dL Final  10/21/2011 10:40 AM 1.9* 0.4 - 1.2 mg/dL Final  05/09/2011 01:57 PM 1.90* 0.50 - 1.10 mg/dL Final  09/17/2009 12:00 AM 1.13 0.40-1.20 mg/dL Final  04/09/2008 09:09 PM 0.91 0.40-1.20 mg/dL Final  01/11/2008 09:57 PM 1.24* 0.40-1.20 mg/dL Final    PMH:   Past Medical History  Diagnosis Date  . Hypertension   . Diabetes mellitus   . Gout   . Depression   . Gout   . Hyperlipidemia   . Blood transfusion without reported diagnosis   . Morbid obesity (  [place])   . Chronic kidney disease     STAGE 3 CHRONIC KIDNEY DISEASE SECONDARY TO DIABETIC GLOMERULOSCLEROSIS AND UNCONTROLLED HYPERTENSION - PER OFFICE NOTES DR. Florene Glen -[facility].  Marland Kitchen Headache(784.0)     CHRONIC H/A'S  . Pain     LEFT SHOULDER PAIN - WAS SEEN AT AN URGENT CARE - GIVEN SLING FOR COMFORT AND TOLD ROM AS TOLERATED.    PSH:   Past Surgical History  Procedure Laterality Date  . Cesarean section  1988, 1993  . Carpal tunnel release  2008  . Laparoscopic gastric sleeve resection N/A 07/19/2012    Procedure: LAPAROSCOPIC SLEEVE GASTRECTOMY with EGD;  Surgeon: Madilyn Hook, DO;  Location: [facility];  Service: General;  Laterality: N/A;  laparoscopic sleeve gastrectomy with  EGD  . Upper gi endoscopy N/A 07/19/2012    Procedure: UPPER GI ENDOSCOPY;  Surgeon: Madilyn Hook, DO;  Location: [facility];  Service: General;  Laterality: N/A;  . Esophagogastroduodenoscopy N/A 09/04/2012    Procedure: ESOPHAGOGASTRODUODENOSCOPY (EGD);  Surgeon: Shann Medal, MD;  Location: [facility];  Service: General;  Laterality: N/A;  PF  . Esophagogastroduodenoscopy (egd) with esophageal dilation N/A 09/29/2012    Procedure: ESOPHAGOGASTRODUODENOSCOPY (EGD) WITH ESOPHAGEAL DILATION;  Surgeon: Milus Banister, MD;  Location: [facility];  Service: Endoscopy;  Laterality: N/A;    Allergies:  Allergies  Allergen Reactions  . Amlodipine     edema  . Clonidine Derivatives Swelling  . Welchol [Colesevelam Hcl] Nausea Only    Medications:   Prior to Admission medications   Medication Sig Start Date End Date Taking? Authorizing Provider  allopurinol (ZYLOPRIM) 100 MG tablet Take 1 tablet (100 mg total) by mouth daily. Patient taking differently: Take 100 mg by mouth daily as needed (for gout flare ups).  09/13/14  Yes Biagio Borg, MD  Aspirin-Acetaminophen-Caffeine (GOODY HEADACHE PO) Take 1 packet by mouth every 4 (four) hours as needed (for pain).   Yes Historical Provider, MD  losartan-hydrochlorothiazide (HYZAAR) 100-25 MG tablet Take 1 tablet by mouth daily. 12/31/14  Yes [name], MD  nebivolol (BYSTOLIC) 10 MG tablet TAKE 2 TABLETS (20 MG TOTAL) BY MOUTH DAILY. Patient taking differently: Take 10 mg by mouth 2 (two) times daily.  12/20/14  Yes Biagio Borg, MD    Inpatient medications: . heparin  5,000 Units Subcutaneous 3 times per day  . nebivolol  10 mg Oral BID    Discontinued Meds:   Medications Discontinued During This Encounter  Medication Reason  . acetaminophen (TYLENOL) 325 MG tablet No longer needed (for PRN medications)  . ibuprofen (ADVIL,MOTRIN) 200 MG tablet   . diclofenac sodium (VOLTAREN) 1 % GEL   . ipratropium (ATROVENT) 0.06 % nasal spray      Social History:  reports that she has quit smoking. Her smoking use included Cigarettes. She quit after 20 years of use. She has never used smokeless tobacco. She reports that she does not drink alcohol or use illicit drugs.  Family History:   Family History  Problem Relation Age of Onset  . Hypertension Mother   . Diabetes Father   . Arthritis Other   . Hyperlipidemia Other   . Cancer Neg Hx   . Heart disease Neg Hx   . Kidney disease Neg Hx   . Stroke Neg Hx     Pertinent items are noted in HPI. Weight change:   Intake/Output Summary (Last 24 hours) at 01/02/15 0735 Last data filed at 01/02/15 0456  Gross  per 24 hour  Intake    222 ml  Output    600 ml  Net   -378 ml   BP 155/85 mmHg  Pulse 63  Temp(Src) 98 F (36.7 C) (Oral)  Resp 16  Ht 5\' 5"  (1.651 m)  Wt 131.997 kg (291 lb)  BMI 48.43 kg/m2  SpO2 97% Filed Vitals:   01/01/15 2007 01/01/15 2216 01/01/15 2301 01/02/15 0455  BP: 180/80 180/97 181/79 155/85  Pulse: 67 63 68 63  Temp: 98.2 F (36.8 C)  98.2 F (36.8 C) 98 F (36.7 C)  TempSrc: Oral  Oral Oral  Resp: 16 20 12 16   Height:   5\' 5"  (1.651 m)   Weight:   131.997 kg (291 lb)   SpO2: 98% 98% 96% 97%     General appearance: alert, cooperative and no distress Head: Normocephalic, without obvious abnormality, atraumatic Eyes: negative findings: lids and lashes normal, conjunctivae and sclerae normal and corneas clear Neck: no adenopathy, no carotid bruit, no JVD, supple, symmetrical, trachea midline and thyroid not enlarged, symmetric, no tenderness/mass/nodules Resp: clear to auscultation bilaterally Cardio: regular rate and rhythm, S1, S2 normal, no murmur, click, rub or gallop GI: soft, non-tender; bowel sounds normal; no masses,  no organomegaly Extremities: edema 2+ lower extremities  Labs: Basic Metabolic Panel:  Recent Labs Lab 12/31/14 1510 12/31/14 2004 01/01/15 1723  NA 143 143 142  K 5.1 4.8 5.3*  CL 107 110 108  CO2 19 18*  18*  GLUCOSE 85 95 87  BUN 102* 76* 107*  CREATININE 13.33* 13.88* 14.16*  ALBUMIN 3.4* 3.4*  --   CALCIUM 7.4* 7.4* 7.3*   Liver Function Tests:  Recent Labs Lab 12/31/14 1510 12/31/14 2004  AST 10 11*  ALT 13 16  ALKPHOS 79 75  BILITOT 0.3 0.6  PROT 6.4 6.8  ALBUMIN 3.4* 3.4*    Recent Labs Lab 12/31/14 2004  LIPASE 36   No results for input(s): AMMONIA in the last 168 hours. CBC:  Recent Labs Lab 12/31/14 1510 12/31/14 2004 01/01/15 1723  WBC 6.0 6.6 6.1  NEUTROABS 2.7  --  2.4  HGB 8.1 Repeated and verified X2.* 7.7* 7.9*  HCT 24.9 Repeated and verified X2.* 23.8* 24.2*  MCV 93.6 94.1 93.8  PLT 307.0 297 298   PT/INR: @LABRCNTIP (inr:5) Cardiac Enzymes: ) Recent Labs Lab 12/31/14 1510  CKTOTAL 146  CKMB 3.1   CBG: No results for input(s): GLUCAP in the last 168 hours.  Iron Studies:  Recent Labs Lab 12/31/14 1510  IRON 36*  TRANSFERRIN 242.0  FERRITIN 223.4    Xrays/Other Studies: No results found.   Assessment/Plan: 1.  Progressive CKD vs AKI/CKD- given the duration of her poorly controlled DM and HTN, this is most consistent with progression to ESRD, however renal US was ordered to r/o hydro and evaluate size and number of kidneys.  Will also order other serologies and urine studies, however given her history and presence of severe anemia, will treat as if she has progressed to ESRD.  Will obtain vein mapping and consult [facility] for placement of HD catheter as well as AVF/AVG.  Will educate patient and husband about renal replacement therapies but will start with incenter HD.  We did discuss home therapies but she will need to have permanent access placed and display better compliance with follow up and medications.  Will also hold ARB for now and follow renal function but plan on initiating HD after vascula access has been established. 2.  Anemia of chronic disease- will check iron stores and SPEP/UPEP.  Will initiate ESA therapy and  follow 3. CKD-MBD- will check calcium, phos, and iPTH and initiate vitamin D therapy and phosphate binders as warrented 4. Hyperkalemia- due to #1.  Will treat with IV lasix for now to improve K levels until she can have HD access placed. 5. Malignant HTN- improving with bystolic and will add lasix to help with volume overload until HD is initiated. 6. Vascular access- vein mapping ordered and VVS consulted.   Levander Katzenstein A 01/02/2015, 7:35 AM

## 2015-01-02 NOTE — Consult Note (Signed)
Hospital Consult    Reason for Consult:  In need of diatek and permanent HD access Referring PhysicianMarval Regal MRN #:  [id number]  History of Present Illness: This is a 49 y.o. female who states that on November 27th, she was not feeling well and took her blood pressure and it was 200's/100's.  She states that she had a headache and had a fuzziness over her right eye, which she describes as "mesh-like" and she still currently has this.  She remembers this day b/c it was her birthday.   She states she has been having some swelling in her legs.  She had been on Norvasc in the past, which caused her legs to swell.  She recently went her PCP  (who was not her regular MD) and was started on a new hypertensive medication (3 in 1, which included Norvasc).  She continued to have swelling in her legs and went back to her regular MD.  She told him that she was SOB with short distances, continued leg swelling and just not feeling well.   She says that the doctor also saw some EKG changes that they didn't like and sent her down for labs.   She states she was gone for about 45 minutes and the office called her back and asked her to go to the ER for evaluation for her kidney function.  She was admitted at that time.  She had been on NSAIDS and ARB and these have been discontinued.    She has hx of Gastric bypass surgery in 2014 and since then, her diabetes has been controlled and she is not on medication for this.  The last hgbA1c 02/2013 was 5.3.  Her gluscose has been in the 80's and 90's this admission.    She is on a beta blocker for hypertension.  She is only on SQ heparin for DVT prophylaxis.  She states that once the catheter is placed, the plan is go ahead and draw off fluid per the pt and her husband.   Past Medical History  Diagnosis Date  . Hypertension   . Diabetes mellitus   . Gout   . Depression   . Gout   . Hyperlipidemia   . Blood transfusion without reported diagnosis   . Morbid  obesity ([place])   . Chronic kidney disease     STAGE 3 CHRONIC KIDNEY DISEASE SECONDARY TO DIABETIC GLOMERULOSCLEROSIS AND UNCONTROLLED HYPERTENSION - PER OFFICE NOTES DR. Flore[facility].  Marland Kitchen Headache(784.0)     CHRONIC H/A'S  . Pain     LEFT SHOULDER PAIN - WAS SEEN AT AN URGENT CARE - GIVEN SLING FOR COMFORT AND TOLD ROM AS TOLERATED.    Past Surgical History  Procedure Laterality Date  . Cesarean section  1988, 1993  . Carpal tunnel release  2008  . Laparoscopic gastric sleeve resection N/A 07/19/2012    Procedure: LAPAROSCOPIC SLEEVE GASTRECTOMY with EGD;  Surgeon: Madilyn Hook, DO;  Location: [facility];  Service: General;  Laterality: N/A;  laparoscopic sleeve gastrectomy with EGD  . Upper gi endoscopy N/A 07/19/2012    Procedure: UPPER GI ENDOSCOPY;  Surgeon: Madilyn Hook, DO;  Location: [facility];  Service: General;  Laterality: N/A;  . Esophagogastroduodenoscopy N/A 09/04/2012    Procedure: ESOPHAGOGASTRODUODENOSCOPY (EGD);  Surgeon: Shann Medal, MD;  Location: [facility];  Service: General;  Laterality: N/A;  PF  . Esophagogastroduodenoscopy (egd) with esophageal dilation N/A 09/29/2012    Procedure: ESOPHAGOGASTRODUODENOSCOPY (EGD) WITH ESOPHAGEAL DILATION;  Surgeon: Milus Banister, MD;  Location: [facility];  Service: Endoscopy;  Laterality: N/A;    Allergies  Allergen Reactions  . Amlodipine     edema  . Clonidine Derivatives Swelling  . Welchol [Colesevelam Hcl] Nausea Only    Prior to Admission medications   Medication Sig Start Date End Date Taking? Authorizing Provider  allopurinol (ZYLOPRIM) 100 MG tablet Take 1 tablet (100 mg total) by mouth daily. Patient taking differently: Take 100 mg by mouth daily as needed (for gout flare ups).  09/13/14  Yes Biagio Borg, MD  Aspirin-Acetaminophen-Caffeine (GOODY HEADACHE PO) Take 1 packet by mouth every 4 (four) hours as needed (for pain).   Yes Historical Provider, MD  losartan-hydrochlorothiazide (HYZAAR) 100-25  MG tablet Take 1 tablet by mouth daily. 12/31/14  Yes [name], MD  nebivolol (BYSTOLIC) 10 MG tablet TAKE 2 TABLETS (20 MG TOTAL) BY MOUTH DAILY. Patient taking differently: Take 10 mg by mouth 2 (two) times daily.  12/20/14  Yes Biagio Borg, MD    Social History   Social History  . Marital Status: Single    Spouse Name: N/A  . Number of Children: N/A  . Years of Education: N/A   Occupational History  . Not on file.   Social History Main Topics  . Smoking status: Former Smoker -- 20 years    Types: Cigarettes  . Smokeless tobacco: Never Used     Comment: QUIT SMOKING ABOUT 2009  . Alcohol Use: No  . Drug Use: No  . Sexual Activity: Not on file   Other Topics Concern  . Not on file   Social History Narrative     Family History  Problem Relation Age of Onset  . Hypertension Mother   . Diabetes Father   . Arthritis Other   . Hyperlipidemia Other   . Cancer Neg Hx   . Heart disease Neg Hx   . Kidney disease Neg Hx   . Stroke Neg Hx     ROS: [x]  Positive   [ ]  Negative   [ ]  All sytems reviewed and are negative  Cardiovascular: []  chest pain/pressure []  palpitations []  SOB lying flat [x]  DOE []  pain in legs while walking []  pain in legs at rest []  pain in legs at night []  non-healing ulcers []  hx of DVT [x]  swelling in legs  Pulmonary: []  productive cough []  asthma/wheezing []  home O2 [x]  headache with high blood pressure  Neurologic: []  weakness in []  arms []  legs []  numbness in []  arms []  legs []  hx of CVA []  mini stroke [] difficulty speaking or slurred speech []  temporary loss of vision in one eye []  dizziness  Hematologic: []  hx of cancer []  bleeding problems []  problems with blood clotting easily  Endocrine:   [x]  diabetes []  thyroid disease  GI []  vomiting blood []  blood in stool  GU: [x]  CKD/renal failure []  HD--[]  M/W/F or []  T/T/S []  burning with urination []  blood in urine  Psychiatric: []  anxiety [x]   depression  Musculoskeletal: []  arthritis []  joint pain [x]  gout  Integumentary: []  rashes []  ulcers  Constitutional: []  fever []  chills   Physical Examination  Filed Vitals:   01/02/15 0455 01/02/15 1000  BP: 155/85 142/72  Pulse: 63 66  Temp: 98 F (36.7 C) 98.2 F (36.8 C)  Resp: 16 18   Body mass index is 48.43 kg/(m^2).  General:  WDWN in NAD Gait: Not observed HENT: WNL, normocephalic Pulmonary: normal non-labored breathing, without  Rales, rhonchi,  wheezing Cardiac: regular, without  Murmurs, rubs or gallops; without carotid bruits Abdomen:  obese Skin: without rashes Vascular Exam/Pulses:  Right Left  Radial 2+ (normal) 2+ (normal)  Ulnar Unable to palpate  Unable to palpate   DP 2+ (normal) 2+ (normal)  PT 1+ (weak) 1+ (weak)   Extremities: without ischemic changes, without Gangrene , without cellulitis; without open wounds; +BLE edema Musculoskeletal: no muscle wasting or atrophy  Neurologic: A&O X 3; Appropriate Affect ; SENSATION: normal; MOTOR FUNCTION:  moving all extremities equally. Speech is fluent/normal Psychiatric:  Normal affect capable of medical decision making   CBC    Component Value Date/Time   WBC 6.1 01/01/2015 1723   RBC 2.58* 01/01/2015 1723   HGB 7.9* 01/01/2015 1723   HCT 24.2* 01/01/2015 1723   PLT 298 01/01/2015 1723   MCV 93.8 01/01/2015 1723   MCH 30.6 01/01/2015 1723   MCHC 32.6 01/01/2015 1723   RDW 13.8 01/01/2015 1723   LYMPHSABS 1.6 01/01/2015 1723   MONOABS 0.7 01/01/2015 1723   EOSABS 1.5* 01/01/2015 1723   BASOSABS 0.0 01/01/2015 1723    BMET    Component Value Date/Time   NA 142 01/01/2015 1723   K 5.3* 01/01/2015 1723   CL 108 01/01/2015 1723   CO2 18* 01/01/2015 1723   GLUCOSE 87 01/01/2015 1723   BUN 107* 01/01/2015 1723   CREATININE 14.16* 01/01/2015 1723   CREATININE 2.28* 03/21/2012 0952   CALCIUM 7.3* 01/01/2015 1723   GFRNONAA 3* 01/01/2015 1723   GFRAA 3* 01/01/2015 1723     COAGS: No results found for: INR, PROTIME   Non-Invasive Vascular Imaging:   Vein mapping ordered  Statin:  No. Beta Blocker:  Yes.   Aspirin:  No. ACEI:  No. ARB:  No.-recently discontinued Other antiplatelets/anticoagulants:  Yes.   SQ heparin    ASSESSMENT/PLAN: This is a 49 y.o. female with progressive CKD vs AKI/CKD is in need of permanent HD access   -vein mapping has been ordered for today and I have spoken to [place] in the vascular lab and they will do her vein mapping for today. -she is right hand dominant, so preference would be left arm fistula vs graft. -I did discuss the symptoms of steal syndrome and the possibility of needing further procedures in the future to maintain access with her and her husband and they both express understanding -pt is pre-op'd with exception of consent-await vein mapping   Leontine Locket, PA-C [facility] [phone]  Agree with above assessment I have examined patient and hopefully her veins in the left upper extremity will be satisfactory for left brachial cephalic or radial cephalic fistula creation We will plan left upper extremity fistula plus tunneled hemodialysis catheter tomorrow per Dr. Doren Custard if schedule permits Patient's questions were answered and she would like to proceed If Vein mapping reveals right upper extremity would be a better choice than we will adjust to that.

## 2015-01-02 NOTE — Progress Notes (Signed)
[facility] PROGRESS NOTE  Brenda Davis BDZ:[id number] DOB: 12/01/1965 DOA: 01/01/2015 PCP: Brenda Calico, MD  Assessment/Plan: #1 progressive chronic kidney disease stage V versus acute on chronic kidney disease probably requiring hemodialysis Patient with complaints worrisome for progressive chronic kidney disease and on admission noted to have a creatinine of 13.88. Creatinine currently at 14.16. Renal ultrasound negative for hydronephrosis. Patient has been seen in consultation by nephrology who feel patient likely has progressive chronic kidney disease, and may require hemodialysis. Serologies a urine studies have been ordered per nephrology. Vein mapping has been obtained today 01/02/2015 and vascular surgery has been consulted for placement of HD cath as well as aVF/AVG. ARB on hold. Patient has been placed on IV Lasix per nephrology. Per nephrology.  #2 anemia of chronic kidney disease Hemoglobin currently stable. Iron studies as well as SPEP/UPEP has been ordered per nephrology.  #3 hypertension Stable. Continue bystolic and Lasix.  #4 hyperkalemia Secondary to problem #1. Patient has been started on IV diuretics per nephrology.  #5 diabetes mellitus II Hemoglobin A1c was 5.3 on 03/03/2013. Currently diet-controlled. Repeat a hemoglobin A1c. Check CBGs. Place on sliding scale insulin.  #6 prophylaxis Heparin for DVT prophylaxis.  Code Status: Full Family Communication: Updated patient and family at bedside. Disposition Plan: Per nephrology.   Consultants:  Nephrology: Dr. Marval Davis 01/02/2015  Vascular surgery: Dr. Kellie Davis 01/02/2015  Procedures:  Vein mapping 01/02/2015  Renal ultrasound 01/02/2015  Antibiotics:  None  HPI/Subjective: Patient denies any shortness of breath. Patient denies any chest pain. Patient denies any nausea.  Objective: Filed Vitals:   01/02/15 0455 01/02/15 1000  BP: 155/85 142/72  Pulse: 63 66  Temp: 98 F (36.7 C)  98.2 F (36.8 C)  Resp: 16 18    Intake/Output Summary (Last 24 hours) at 01/02/15 1622 Last data filed at 01/02/15 1502  Gross per 24 hour  Intake    342 ml  Output   1100 ml  Net   -758 ml   Filed Weights   01/01/15 2301  Weight: 131.997 kg (291 lb)    Exam:   General:  NAD  Cardiovascular: RRR  Respiratory: CTAB  Abdomen: Obese, soft, nondistended, nontender, positive bowel sounds.  Musculoskeletal: No clubbing or cyanosis. 2+ bilateral lower extremity edema.  Data Reviewed: Basic Metabolic Panel:  Recent Labs Lab 12/31/14 1510 12/31/14 2004 01/01/15 1723  NA 143 143 142  K 5.1 4.8 5.3*  CL 107 110 108  CO2 19 18* 18*  GLUCOSE 85 95 87  BUN 102* 76* 107*  CREATININE 13.33* 13.88* 14.16*  CALCIUM 7.4* 7.4* 7.3*   Liver Function Tests:  Recent Labs Lab 12/31/14 1510 12/31/14 2004  AST 10 11*  ALT 13 16  ALKPHOS 79 75  BILITOT 0.3 0.6  PROT 6.4 6.8  ALBUMIN 3.4* 3.4*    Recent Labs Lab 12/31/14 2004  LIPASE 36   No results for input(s): AMMONIA in the last 168 hours. CBC:  Recent Labs Lab 12/31/14 1510 12/31/14 2004 01/01/15 1723  WBC 6.0 6.6 6.1  NEUTROABS 2.7  --  2.4  HGB 8.1 Repeated and verified X2.* 7.7* 7.9*  HCT 24.9 Repeated and verified X2.* 23.8* 24.2*  MCV 93.6 94.1 93.8  PLT 307.0 297 298   Cardiac Enzymes:  Recent Labs Lab 12/31/14 1510  CKTOTAL 146  CKMB 3.1   BNP (last 3 results) No results for input(s): BNP in the last 8760 hours.  ProBNP (last 3 results)  Recent Labs  12/31/14 1510  PROBNP 477.0*    CBG: No results for input(s): GLUCAP in the last 168 hours.  No results found for this or any previous visit (from the past 240 hour(s)).   Studies: US Renal  01/02/2015  CLINICAL DATA:  Acute renal failure EXAM: RENAL / URINARY TRACT ULTRASOUND COMPLETE COMPARISON:  CT abdomen pelvis dated 09/02/2012 FINDINGS: Right Kidney: Length: 10.0 cm. Echogenic renal parenchyma. No mass or hydronephrosis.  Left Kidney: Length: 10.1 cm. Echogenic renal parenchyma. No mass or hydronephrosis. Bladder: Mildly thick-walled although underdistended. IMPRESSION: Echogenic renal parenchyma, suggesting medical renal disease. No hydronephrosis. Electronically Signed   By: Brenda Davis M.D.   On: 01/02/2015 14:09    Scheduled Meds: . cefUROXime (ZINACEF)  IV  1.5 g Intravenous To SSTC  . furosemide  80 mg Intravenous BID  . heparin  5,000 Units Subcutaneous 3 times per day  . nebivolol  10 mg Oral BID   Continuous Infusions:   Principal Problem:   CKD (chronic kidney disease), stage V Brenda Davis): Progressive Active Problems:   HYPERTENSION, BENIGN ESSENTIAL   CKD (chronic kidney disease) stage 5, GFR less than 15 ml/min ([facility])   Kidney failure   DM2 (diabetes mellitus, type 2) ([place])    Time spent: 51 minutes    Brenda Davis M.D. [facility] Pager [phone]. If 7PM-7AM, please contact night-coverage at [email or web address], password [facility] 01/02/2015, 4:22 PM  LOS: 1 day

## 2015-01-02 NOTE — Progress Notes (Signed)
Patient reported that her bp was elevated this evening. Reassessed bp. 200/94. Paged MD. Orders received. 10 mg hydralazine given IV. Patient concerned about taking amlodipine. Discussed concern with MD. Offered reassurance to patient that amlodipine is ok to take. Reassessed bp 156/94. Will continue to monitor. Bartholomew Crews, RN

## 2015-01-02 NOTE — Progress Notes (Signed)
New Admission Note:  Arrival Method: via stretcher with [facility] Mental Orientation: Alert&orientedx4 Telemetry:n/a Assessment: Completed Skin: dry and intact, 2+ edema lower extremities IV: left hand, saline locked Pain: denies any pain Tubes: n/a Safety Measures: Safety Fall Prevention Plan discussed. Admission: Completed [facility] [place] Orientation: Patient has been orientated to the room, unit and the staff. Family: at bedside  Orders have been reviewed and implemented. Will continue to monitor the patient. Call light has been placed within reach and bed alarm has been activated.   Leandro Reasoner BSN, RN  Phone Number: [phone] [facility]

## 2015-01-03 ENCOUNTER — Encounter (HOSPITAL_COMMUNITY)
Admission: EM | Disposition: A | Discharge status: Home / Self Care | Payer: Self-pay | Source: Home / Self Care | Attending: Internal Medicine

## 2015-01-03 ENCOUNTER — Other Ambulatory Visit: Payer: Self-pay | Admitting: *Deleted

## 2015-01-03 ENCOUNTER — Inpatient Hospital Stay (HOSPITAL_COMMUNITY): Payer: 59 | Admitting: Anesthesiology

## 2015-01-03 ENCOUNTER — Inpatient Hospital Stay (HOSPITAL_COMMUNITY): Payer: 59

## 2015-01-03 DIAGNOSIS — Z95828 Presence of other vascular implants and grafts: Secondary | ICD-10-CM | POA: Insufficient documentation

## 2015-01-03 DIAGNOSIS — E119 Type 2 diabetes mellitus without complications: Secondary | ICD-10-CM | POA: Insufficient documentation

## 2015-01-03 DIAGNOSIS — R079 Chest pain, unspecified: Secondary | ICD-10-CM | POA: Clinically undetermined

## 2015-01-03 DIAGNOSIS — N186 End stage renal disease: Secondary | ICD-10-CM

## 2015-01-03 DIAGNOSIS — Z992 Dependence on renal dialysis: Secondary | ICD-10-CM | POA: Insufficient documentation

## 2015-01-03 DIAGNOSIS — Z4931 Encounter for adequacy testing for hemodialysis: Secondary | ICD-10-CM

## 2015-01-03 HISTORY — PX: INSERTION OF DIALYSIS CATHETER: SHX1324

## 2015-01-03 HISTORY — PX: AV FISTULA PLACEMENT: SHX1204

## 2015-01-03 LAB — PROTIME-INR
INR: 1.13 (ref 0.00–1.49)
Prothrombin Time: 14.7 seconds (ref 11.6–15.2)

## 2015-01-03 LAB — PROTEIN ELECTROPHORESIS, SERUM
A/G Ratio: 1 (ref 0.7–1.7)
Albumin ELP: 2.5 g/dL — ABNORMAL LOW (ref 2.9–4.4)
Alpha-1-Globulin: 0.3 g/dL (ref 0.0–0.4)
Alpha-2-Globulin: 0.8 g/dL (ref 0.4–1.0)
Beta Globulin: 0.9 g/dL (ref 0.7–1.3)
Gamma Globulin: 0.5 g/dL (ref 0.4–1.8)
Globulin, Total: 2.5 g/dL (ref 2.2–3.9)
Total Protein ELP: 5 g/dL — ABNORMAL LOW (ref 6.0–8.5)

## 2015-01-03 LAB — CBC
HCT: 22.5 % — ABNORMAL LOW (ref 36.0–46.0)
HCT: 21.7 % — ABNORMAL LOW (ref 36.0–46.0)
Hemoglobin: 7.2 g/dL — ABNORMAL LOW (ref 12.0–15.0)
Hemoglobin: 6.8 g/dL — CL (ref 12.0–15.0)
MCH: 30.1 pg (ref 26.0–34.0)
MCH: 29.7 pg (ref 26.0–34.0)
MCHC: 32 g/dL (ref 30.0–36.0)
MCHC: 31.3 g/dL (ref 30.0–36.0)
MCV: 94.1 fL (ref 78.0–100.0)
MCV: 94.8 fL (ref 78.0–100.0)
Platelets: 276 10*3/uL (ref 150–400)
Platelets: 282 10*3/uL (ref 150–400)
RBC: 2.39 MIL/uL — ABNORMAL LOW (ref 3.87–5.11)
RBC: 2.29 MIL/uL — ABNORMAL LOW (ref 3.87–5.11)
RDW: 13.5 % (ref 11.5–15.5)
RDW: 13.5 % (ref 11.5–15.5)
WBC: 4.7 10*3/uL (ref 4.0–10.5)
WBC: 5.7 10*3/uL (ref 4.0–10.5)

## 2015-01-03 LAB — RENAL FUNCTION PANEL
Albumin: 2.5 g/dL — ABNORMAL LOW (ref 3.5–5.0)
Albumin: 2.8 g/dL — ABNORMAL LOW (ref 3.5–5.0)
Anion gap: 13 (ref 5–15)
Anion gap: 15 (ref 5–15)
BUN: 103 mg/dL — ABNORMAL HIGH (ref 6–20)
BUN: 104 mg/dL — ABNORMAL HIGH (ref 6–20)
CO2: 19 mmol/L — ABNORMAL LOW (ref 22–32)
CO2: 17 mmol/L — ABNORMAL LOW (ref 22–32)
Calcium: 7.3 mg/dL — ABNORMAL LOW (ref 8.9–10.3)
Calcium: 7.7 mg/dL — ABNORMAL LOW (ref 8.9–10.3)
Chloride: 111 mmol/L (ref 101–111)
Chloride: 111 mmol/L (ref 101–111)
Creatinine, Ser: 14.4 mg/dL — ABNORMAL HIGH (ref 0.44–1.00)
Creatinine, Ser: 14.51 mg/dL — ABNORMAL HIGH (ref 0.44–1.00)
GFR calc Af Amer: 3 mL/min — ABNORMAL LOW (ref >60)
GFR calc Af Amer: 3 mL/min — ABNORMAL LOW (ref >60)
GFR calc non Af Amer: 3 mL/min — ABNORMAL LOW (ref >60)
GFR calc non Af Amer: 3 mL/min — ABNORMAL LOW (ref >60)
Glucose, Bld: 78 mg/dL (ref 65–99)
Glucose, Bld: 79 mg/dL (ref 65–99)
Phosphorus: 10.6 mg/dL — ABNORMAL HIGH (ref 2.5–4.6)
Phosphorus: 10.7 mg/dL — ABNORMAL HIGH (ref 2.5–4.6)
Potassium: 4.8 mmol/L (ref 3.5–5.1)
Potassium: 4.8 mmol/L (ref 3.5–5.1)
Sodium: 143 mmol/L (ref 135–145)
Sodium: 143 mmol/L (ref 135–145)

## 2015-01-03 LAB — UIFE/LIGHT CHAINS/TP QN, 24-HR UR
% BETA, Urine: 14.2 %
ALPHA 1 URINE: 6.2 %
Albumin, U: 65.7 %
Alpha 2, Urine: 6.9 %
Free Kappa/Lambda Ratio: 7.39 (ref 2.04–10.37)
Free Lambda Lt Chains,Ur: 34.5 mg/L — ABNORMAL HIGH (ref 0.24–6.66)
Free Lt Chn Excr Rate: 255 mg/L — ABNORMAL HIGH (ref 1.35–24.19)
GAMMA GLOBULIN URINE: 6.9 %
Total Protein, Urine: 721.2 mg/dL

## 2015-01-03 LAB — GLUCOSE, CAPILLARY
Glucose-Capillary: 70 mg/dL (ref 65–99)
Glucose-Capillary: 83 mg/dL (ref 65–99)
Glucose-Capillary: 82 mg/dL (ref 65–99)
Glucose-Capillary: 67 mg/dL (ref 65–99)
Glucose-Capillary: 111 mg/dL — ABNORMAL HIGH (ref 65–99)

## 2015-01-03 LAB — SURGICAL PCR SCREEN
MRSA, PCR: NEGATIVE
Staphylococcus aureus: NEGATIVE

## 2015-01-03 LAB — IRON AND TIBC
Iron: 62 ug/dL (ref 28–170)
Saturation Ratios: 21 % (ref 10.4–31.8)
TIBC: 290 ug/dL (ref 250–450)
UIBC: 228 ug/dL

## 2015-01-03 LAB — PARATHYROID HORMONE, INTACT (NO CA): PTH: 265 pg/mL — ABNORMAL HIGH (ref 15–65)

## 2015-01-03 LAB — VITAMIN D 25 HYDROXY (VIT D DEFICIENCY, FRACTURES): Vit D, 25-Hydroxy: 5.7 ng/mL — ABNORMAL LOW (ref 30.0–100.0)

## 2015-01-03 LAB — VITAMIN B6: Vitamin B6: 7.4 ng/mL (ref 2.1–21.7)

## 2015-01-03 LAB — HEPATITIS B SURFACE ANTIBODY,QUALITATIVE: Hep B S Ab: NONREACTIVE

## 2015-01-03 LAB — FERRITIN: Ferritin: 286 ng/mL (ref 11–307)

## 2015-01-03 LAB — TROPONIN I
Troponin I: <0.03 ng/mL (ref <0.031)
Troponin I: 0.04 ng/mL — ABNORMAL HIGH (ref <0.031)

## 2015-01-03 LAB — ABO/RH: ABO/RH(D): B POS

## 2015-01-03 LAB — VITAMIN B12: Vitamin B-12: 667 pg/mL (ref 180–914)

## 2015-01-03 LAB — FOLATE: Folate: 6.9 ng/mL (ref >5.9)

## 2015-01-03 LAB — PREPARE RBC (CROSSMATCH)

## 2015-01-03 LAB — HEPATITIS B SURFACE ANTIGEN: Hepatitis B Surface Ag: NEGATIVE

## 2015-01-03 SURGERY — ARTERIOVENOUS (AV) FISTULA CREATION
Anesthesia: Monitor Anesthesia Care

## 2015-01-03 MED ORDER — LIDOCAINE HCL (PF) 1 % IJ SOLN: 5.0000 mL | INTRAMUSCULAR | Status: DC | PRN

## 2015-01-03 MED ORDER — PROTAMINE SULFATE 10 MG/ML IV SOLN
INTRAVENOUS | Status: DC | PRN
  Administered 2015-01-03: 50 mg via INTRAVENOUS

## 2015-01-03 MED ORDER — OXYCODONE HCL 5 MG/5ML PO SOLN: 5.0000 mg | Freq: Once | ORAL | Status: DC | PRN

## 2015-01-03 MED ORDER — LIDOCAINE-EPINEPHRINE (PF) 1 %-1:200000 IJ SOLN
INTRAMUSCULAR | Status: AC
  Filled 2015-01-03: qty 30

## 2015-01-03 MED ORDER — OXYCODONE HCL 5 MG PO TABS: 5.0000 mg | ORAL_TABLET | Freq: Once | ORAL | Status: DC | PRN

## 2015-01-03 MED ORDER — OXYCODONE HCL 5 MG PO TABS
5.0000 mg | ORAL_TABLET | Freq: Four times a day (QID) | ORAL | Status: DC | PRN
  Administered 2015-01-04: 5 mg via ORAL
  Filled 2015-01-03 (×2): qty 1

## 2015-01-03 MED ORDER — PROPOFOL 500 MG/50ML IV EMUL: INTRAVENOUS | Status: DC | PRN

## 2015-01-03 MED ORDER — SODIUM CHLORIDE 0.9 % IV SOLN: 100.0000 mL | INTRAVENOUS | Status: DC | PRN

## 2015-01-03 MED ORDER — FENTANYL CITRATE (PF) 250 MCG/5ML IJ SOLN
INTRAMUSCULAR | Status: AC
  Filled 2015-01-03: qty 5

## 2015-01-03 MED ORDER — SODIUM CHLORIDE 0.9 % IV SOLN
INTRAVENOUS | Status: DC | PRN
  Administered 2015-01-03 (×2): via INTRAVENOUS

## 2015-01-03 MED ORDER — DIPHENHYDRAMINE HCL 25 MG PO CAPS: 25.0000 mg | ORAL_CAPSULE | Freq: Once | ORAL | Status: DC

## 2015-01-03 MED ORDER — ACETAMINOPHEN 325 MG PO TABS: 325.0000 mg | ORAL_TABLET | ORAL | Status: DC | PRN

## 2015-01-03 MED ORDER — LIDOCAINE-PRILOCAINE 2.5-2.5 % EX CREA
1.0000 "application " | TOPICAL_CREAM | CUTANEOUS | Status: DC | PRN
  Filled 2015-01-03: qty 5

## 2015-01-03 MED ORDER — HEPARIN SODIUM (PORCINE) 1000 UNIT/ML IJ SOLN
INTRAMUSCULAR | Status: AC
  Filled 2015-01-03: qty 1

## 2015-01-03 MED ORDER — FENTANYL CITRATE (PF) 100 MCG/2ML IJ SOLN
INTRAMUSCULAR | Status: DC | PRN
  Administered 2015-01-03: 50 ug via INTRAVENOUS

## 2015-01-03 MED ORDER — LIDOCAINE HCL (PF) 1 % IJ SOLN
INTRAMUSCULAR | Status: AC
  Filled 2015-01-03: qty 30

## 2015-01-03 MED ORDER — FENTANYL CITRATE (PF) 100 MCG/2ML IJ SOLN: 25.0000 ug | INTRAMUSCULAR | Status: DC | PRN

## 2015-01-03 MED ORDER — PROPOFOL 500 MG/50ML IV EMUL
INTRAVENOUS | Status: DC | PRN
  Administered 2015-01-03: 50 ug/kg/min via INTRAVENOUS

## 2015-01-03 MED ORDER — SODIUM CHLORIDE 0.9 % IV SOLN
INTRAVENOUS | Status: DC | PRN
  Administered 2015-01-03: 13:00:00

## 2015-01-03 MED ORDER — ACETAMINOPHEN 325 MG PO TABS: 650.0000 mg | ORAL_TABLET | Freq: Once | ORAL | Status: DC

## 2015-01-03 MED ORDER — SODIUM CHLORIDE 0.9 % IV SOLN
INTRAVENOUS | Status: DC
  Administered 2015-01-03: 10 mL/h via INTRAVENOUS

## 2015-01-03 MED ORDER — PENTAFLUOROPROP-TETRAFLUOROETH EX AERO: 1.0000 "application " | INHALATION_SPRAY | CUTANEOUS | Status: DC | PRN

## 2015-01-03 MED ORDER — HEPARIN SODIUM (PORCINE) 1000 UNIT/ML IJ SOLN
INTRAMUSCULAR | Status: DC | PRN
  Administered 2015-01-03: 3.4 mL via INTRAVENOUS

## 2015-01-03 MED ORDER — SODIUM CHLORIDE 0.9 % IV SOLN: Freq: Once | INTRAVENOUS | Status: DC

## 2015-01-03 MED ORDER — HEPARIN SODIUM (PORCINE) 5000 UNIT/ML IJ SOLN
5000.0000 [IU] | Freq: Three times a day (TID) | INTRAMUSCULAR | Status: DC
  Administered 2015-01-04 – 2015-01-07 (×10): 5000 [IU] via SUBCUTANEOUS
  Filled 2015-01-03 (×8): qty 1

## 2015-01-03 MED ORDER — LIDOCAINE-EPINEPHRINE (PF) 1 %-1:200000 IJ SOLN
INTRAMUSCULAR | Status: DC | PRN
  Administered 2015-01-03: 30 mL

## 2015-01-03 MED ORDER — MIDAZOLAM HCL 5 MG/5ML IJ SOLN
INTRAMUSCULAR | Status: DC | PRN
  Administered 2015-01-03 (×2): 1 mg via INTRAVENOUS

## 2015-01-03 MED ORDER — HEPARIN SODIUM (PORCINE) 1000 UNIT/ML DIALYSIS: 1000.0000 [IU] | INTRAMUSCULAR | Status: DC | PRN

## 2015-01-03 MED ORDER — PROPOFOL 10 MG/ML IV BOLUS
INTRAVENOUS | Status: AC
  Filled 2015-01-03: qty 20

## 2015-01-03 MED ORDER — HEPARIN SODIUM (PORCINE) 1000 UNIT/ML IJ SOLN
INTRAMUSCULAR | Status: DC | PRN
  Administered 2015-01-03: 9000 [IU] via INTRAVENOUS

## 2015-01-03 MED ORDER — PROPOFOL 10 MG/ML IV BOLUS
INTRAVENOUS | Status: DC | PRN
  Administered 2015-01-03 (×3): 10 mg via INTRAVENOUS

## 2015-01-03 MED ORDER — ACETAMINOPHEN 160 MG/5ML PO SOLN: 325.0000 mg | ORAL | Status: DC | PRN

## 2015-01-03 MED ORDER — DEXTROSE 50 % IV SOLN
INTRAVENOUS | Status: AC
  Administered 2015-01-03: 50 mL
  Filled 2015-01-03: qty 50

## 2015-01-03 MED ORDER — 0.9 % SODIUM CHLORIDE (POUR BTL) OPTIME
TOPICAL | Status: DC | PRN
  Administered 2015-01-03: 1000 mL

## 2015-01-03 MED ORDER — ALTEPLASE 2 MG IJ SOLR
2.0000 mg | Freq: Once | INTRAMUSCULAR | Status: DC | PRN
  Filled 2015-01-03: qty 2

## 2015-01-03 SURGICAL SUPPLY — 57 items
ARMBAND PINK RESTRICT EXTREMIT (MISCELLANEOUS) ×3 IMPLANT
BAG DECANTER FOR FLEXI CONT (MISCELLANEOUS) ×3 IMPLANT
BIOPATCH RED 1 DISK 7.0 (GAUZE/BANDAGES/DRESSINGS) ×3 IMPLANT
CANISTER SUCTION 2500CC (MISCELLANEOUS) ×3 IMPLANT
CANNULA VESSEL 3MM 2 BLNT TIP (CANNULA) ×3 IMPLANT
CATH CANNON HEMO 15F 50CM (CATHETERS) IMPLANT
CATH CANNON HEMO 15FR 19 (HEMODIALYSIS SUPPLIES) IMPLANT
CATH CANNON HEMO 15FR 23CM (HEMODIALYSIS SUPPLIES) IMPLANT
CATH CANNON HEMO 15FR 31CM (HEMODIALYSIS SUPPLIES) IMPLANT
CATH CANNON HEMO 15FR 32 (HEMODIALYSIS SUPPLIES) IMPLANT
CATH CANNON HEMO 15FR 32CM (HEMODIALYSIS SUPPLIES) IMPLANT
CATH PALINDROME REV 23CM (CATHETERS) ×1 IMPLANT
CHLORAPREP W/TINT 26ML (MISCELLANEOUS) ×3 IMPLANT
CLIP TI MEDIUM 6 (CLIP) ×3 IMPLANT
CLIP TI WIDE RED SMALL 6 (CLIP) ×5 IMPLANT
COVER PROBE W GEL 5X96 (DRAPES) ×2 IMPLANT
DECANTER SPIKE VIAL GLASS SM (MISCELLANEOUS) ×2 IMPLANT
DRAPE C-ARM 42X72 X-RAY (DRAPES) ×3 IMPLANT
DRAPE CHEST BREAST 15X10 FENES (DRAPES) ×3 IMPLANT
ELECT REM PT RETURN 9FT ADLT (ELECTROSURGICAL) ×3
ELECTRODE REM PT RTRN 9FT ADLT (ELECTROSURGICAL) ×2 IMPLANT
GAUZE SPONGE 2X2 8PLY STRL LF (GAUZE/BANDAGES/DRESSINGS) ×2 IMPLANT
GAUZE SPONGE 4X4 16PLY XRAY LF (GAUZE/BANDAGES/DRESSINGS) IMPLANT
GLOVE BIO SURGEON STRL SZ 6.5 (GLOVE) ×1 IMPLANT
GLOVE BIO SURGEON STRL SZ7.5 (GLOVE) ×3 IMPLANT
GLOVE BIOGEL PI IND STRL 6.5 (GLOVE) ×2 IMPLANT
GLOVE BIOGEL PI IND STRL 8 (GLOVE) ×2 IMPLANT
GLOVE BIOGEL PI INDICATOR 6.5 (GLOVE) ×2
GLOVE BIOGEL PI INDICATOR 8 (GLOVE) ×1
GOWN STRL REUS W/ TWL LRG LVL3 (GOWN DISPOSABLE) ×6 IMPLANT
GOWN STRL REUS W/TWL LRG LVL3 (GOWN DISPOSABLE) ×9
KIT BASIN OR (CUSTOM PROCEDURE TRAY) ×3 IMPLANT
KIT ROOM TURNOVER OR (KITS) ×3 IMPLANT
LIQUID BAND (GAUZE/BANDAGES/DRESSINGS) ×4 IMPLANT
NDL 18GX1X1/2 (RX/OR ONLY) (NEEDLE) ×1 IMPLANT
NDL HYPO 25GX1X1/2 BEV (NEEDLE) ×2 IMPLANT
NEEDLE 18GX1X1/2 (RX/OR ONLY) (NEEDLE) ×3 IMPLANT
NEEDLE 22X1 1/2 (OR ONLY) (NEEDLE) ×3 IMPLANT
NEEDLE HYPO 25GX1X1/2 BEV (NEEDLE) IMPLANT
NS IRRIG 1000ML POUR BTL (IV SOLUTION) ×3 IMPLANT
PACK CV ACCESS (CUSTOM PROCEDURE TRAY) ×3 IMPLANT
PACK SURGICAL SETUP 50X90 (CUSTOM PROCEDURE TRAY) ×2 IMPLANT
PAD ARMBOARD 7.5X6 YLW CONV (MISCELLANEOUS) ×6 IMPLANT
SPONGE GAUZE 2X2 STER 10/PKG (GAUZE/BANDAGES/DRESSINGS) ×1
SPONGE SURGIFOAM ABS GEL 100 (HEMOSTASIS) IMPLANT
SUT ETHILON 3 0 PS 1 (SUTURE) ×3 IMPLANT
SUT PROLENE 6 0 BV (SUTURE) ×3 IMPLANT
SUT VIC AB 3-0 SH 27 (SUTURE) ×3
SUT VIC AB 3-0 SH 27X BRD (SUTURE) ×2 IMPLANT
SUT VICRYL 4-0 PS2 18IN ABS (SUTURE) ×5 IMPLANT
SYR 20CC LL (SYRINGE) ×5 IMPLANT
SYR 5ML LL (SYRINGE) ×6 IMPLANT
SYR CONTROL 10ML LL (SYRINGE) ×2 IMPLANT
SYRINGE 10CC LL (SYRINGE) ×2 IMPLANT
TAPE CLOTH SURG 4X10 WHT LF (GAUZE/BANDAGES/DRESSINGS) ×1 IMPLANT
UNDERPAD 30X30 INCONTINENT (UNDERPADS AND DIAPERS) ×3 IMPLANT
WATER STERILE IRR 1000ML POUR (IV SOLUTION) ×3 IMPLANT

## 2015-01-03 NOTE — Transfer of Care (Signed)
Immediate Anesthesia Transfer of Care Note  Patient: Brenda Davis  Procedure(s) Performed: Procedure(s): BRACHIAL CEPHALIC ARTERIOVENOUS  FISTULA CREATION LEFT ARM (Left) INSERTION OF DIALYSIS CATHETER RIGHT INTERNAL JUGULAR (N/A)  Patient Location: PACU  Anesthesia Type:MAC  Level of Consciousness: awake, oriented and patient cooperative  Airway & Oxygen Therapy: Patient Spontanous Breathing  Post-op Assessment: Report given to RN, Post -op Vital signs reviewed and stable and Patient moving all extremities X 4  Post vital signs: Reviewed and stable  Last Vitals:  Filed Vitals:   01/02/15 1900 01/03/15 0613  BP: 156/94 142/67  Pulse:  56  Temp:  36.8 C  Resp:  18    Complications: No apparent anesthesia complications

## 2015-01-03 NOTE — Op Note (Signed)
    NAME: Brenda Davis   MRN: [id number] DOB: July 20, 1965    DATE OF OPERATION: 01/03/2015  PREOP DIAGNOSIS: Stage V chronic kidney disease  POSTOP DIAGNOSIS: Same  PROCEDURE:  1. Ultrasound-guided placement of 23 cm tunneled dialysis catheter 2. Left brachial cephalic AV fistula  SURGEON: Judeth Cornfield. Scot Dock, MD, FACS  ASSIST: Leontine Locket, PA  ANESTHESIA: local with sedation   EBL: minimal  INDICATIONS: Brenda Davis is a 49 y.o. female who is to begin dialysis. We were asked to place access and also a catheter.  FINDINGS: 4 mm cephalic vein.  TECHNIQUE: The patient was taken to the operating room and sedated by anesthesia. The neck and upper chest were prepped and draped in usual sterile fashion. Under ultrasound guidance, after the skin was anesthetized, the right IJ was cannulated and a guidewire introduced into the superior vena cava under fluoroscopic control. The tract over the wire was dilated and then a dilator and peel-away sheath were advanced over the wire. The wire and dilator were removed. The catheter was advanced through the peel-away sheath and positioned in the superior vena cava. The peel-away sheath was removed. The exit site of the catheter was selected and the skin anesthetized between the 2 areas. The catheter was then brought to the tunnel, the appropriate length and the distal ports were attached. Both ports withdrew easily and flushed with heparin saline and filled with concentrated heparin. The catheter was secured at its exit site with a 3-0 nylon suture. The IJ cannulation site was closed with a 4-0 subcuticular stitch. Sterile dressing was applied. The patient tolerated the procedure well.  Next attention was turned to the left arm. The left upper extremity was prepped and draped in usual sterile fashion. The upper arm cephalic vein appeared to be adequate by ultrasound. After the skin was anesthetized, a transverse incision was made just above the  antecubital level. The cephalic vein was dissected free. It was ligated distally. It irrigated up nicely with heparinized saline and was approximate 4 mm vein. The brachial artery was dissected free beneath the fascia. The patient was heparinized. The brachial artery was clamped proximally and distally and longitudinal arteriotomy was made. The vein was sewn end-to-side to the artery using continuous 6-0 Prolene suture. At the completion of his good thrill in the fistula and a palpable radial pulse. The heparin was partially reversed with protamine. The wound was closed with a deep layer of 3-0 Vicryl the skin closed with 4-0 Vicryl. Liquid bandage was applied. The patient tolerated the procedure well and transferred to the recovery room in stable condition. All needle and sponge counts were correct.   Deitra Mayo, MD, FACS [facility]  DATE OF DICTATION:   01/03/2015

## 2015-01-03 NOTE — Anesthesia Postprocedure Evaluation (Signed)
Anesthesia Post Note  Patient: Brenda Davis  Procedure(s) Performed: Procedure(s) (LRB): BRACHIAL CEPHALIC ARTERIOVENOUS  FISTULA CREATION LEFT ARM (Left) INSERTION OF DIALYSIS CATHETER RIGHT INTERNAL JUGULAR (N/A)  Patient location during evaluation: PACU Anesthesia Type: MAC Level of consciousness: awake Pain management: pain level controlled Vital Signs Assessment: post-procedure vital signs reviewed and stable Respiratory status: spontaneous breathing Cardiovascular status: stable Postop Assessment: no signs of nausea or vomiting Anesthetic complications: no    Last Vitals:  Filed Vitals:   01/03/15 1345 01/03/15 1400  BP: 113/47 139/68  Pulse: 66 75  Temp:  36.8 C  Resp: 14 22    Last Pain: There were no vitals filed for this visit.               [name]

## 2015-01-03 NOTE — Discharge Instructions (Signed)
° ° °  01/03/2015 NYISHA FRALEIGH [id number] 1965/05/07  Surgeon(s): Angelia Mould, MD  Procedure(s): BRACHIAL CEPHALIC ARTERIOVENOUS  FISTULA CREATION LEFT ARM INSERTION OF DIALYSIS CATHETER RIGHT INTERNAL JUGULAR  x Do not stick fistula for 12 weeks

## 2015-01-03 NOTE — Anesthesia Preprocedure Evaluation (Signed)
Anesthesia Evaluation  Patient identified by MRN, date of birth, ID band Patient awake    Reviewed: Allergy & Precautions, H&P , NPO status , Patient's Chart, lab work & pertinent test results  Airway Mallampati: III  TM Distance: <3 FB Neck ROM: Full    Dental  (+) Teeth Intact   Pulmonary neg shortness of breath, neg sleep apnea, neg COPD, neg recent URI, former smoker, neg PE   Pulmonary exam normal breath sounds clear to auscultation       Cardiovascular hypertension, Pt. on medications Normal cardiovascular exam Rhythm:Regular Rate:Normal     Neuro/Psych  Headaches, PSYCHIATRIC DISORDERS Depression  Neuromuscular disease    GI/Hepatic negative GI ROS, Neg liver ROS,   Endo/Other  diabetes, Insulin DependentMorbid obesity  Renal/GU CRFRenal disease  negative genitourinary   Musculoskeletal negative musculoskeletal ROS (+)   Abdominal   Peds negative pediatric ROS (+)  Hematology  (+) anemia ,   Anesthesia Other Findings   Reproductive/Obstetrics negative OB ROS                             Anesthesia Physical Anesthesia Plan  ASA: III  Anesthesia Plan: MAC   Post-op Pain Management:    Induction: Intravenous  Airway Management Planned: Natural Airway, Nasal Cannula and Simple Face Mask  Additional Equipment: None  Intra-op Plan:   Post-operative Plan:   Informed Consent: I have reviewed the patients History and Physical, chart, labs and discussed the procedure including the risks, benefits and alternatives for the proposed anesthesia with the patient or authorized representative who has indicated his/her understanding and acceptance.   Dental advisory given  Plan Discussed with: CRNA and Surgeon  Anesthesia Plan Comments:         Anesthesia Quick Evaluation

## 2015-01-03 NOTE — H&P (View-Only) (Signed)
Hospital Consult    Reason for Consult:  In need of diatek and permanent HD access Referring Physician:  Coladonato MRN #:  [id number]  History of Present Illness: This is a 49 y.o. female who states that on November 27th, she was not feeling well and took her blood pressure and it was 200's/100's.  She states that she had a headache and had a fuzziness over her right eye, which she describes as "mesh-like" and she still currently has this.  She remembers this day b/c it was her birthday.   She states she has been having some swelling in her legs.  She had been on Norvasc in the past, which caused her legs to swell.  She recently went her PCP  (who was not her regular MD) and was started on a new hypertensive medication (3 in 1, which included Norvasc).  She continued to have swelling in her legs and went back to her regular MD.  She told him that she was SOB with short distances, continued leg swelling and just not feeling well.   She says that the doctor also saw some EKG changes that they didn't like and sent her down for labs.   She states she was gone for about 45 minutes and the office called her back and asked her to go to the ER for evaluation for her kidney function.  She was admitted at that time.  She had been on NSAIDS and ARB and these have been discontinued.    She has hx of Gastric bypass surgery in 2014 and since then, her diabetes has been controlled and she is not on medication for this.  The last hgbA1c 02/2013 was 5.3.  Her gluscose has been in the 80's and 90's this admission.    She is on a beta blocker for hypertension.  She is only on SQ heparin for DVT prophylaxis.  She states that once the catheter is placed, the plan is go ahead and draw off fluid per the pt and her husband.   Past Medical History  Diagnosis Date  . Hypertension   . Diabetes mellitus   . Gout   . Depression   . Gout   . Hyperlipidemia   . Blood transfusion without reported diagnosis   . Morbid  obesity (HCC)   . Chronic kidney disease     STAGE 3 CHRONIC KIDNEY DISEASE SECONDARY TO DIABETIC GLOMERULOSCLEROSIS AND UNCONTROLLED HYPERTENSION - PER OFFICE NOTES DR. POWELL [facility].  . Headache(784.0)     CHRONIC H/A'S  . Pain     LEFT SHOULDER PAIN - WAS SEEN AT AN URGENT CARE - GIVEN SLING FOR COMFORT AND TOLD ROM AS TOLERATED.    Past Surgical History  Procedure Laterality Date  . Cesarean section  1988, 1993  . Carpal tunnel release  2008  . Laparoscopic gastric sleeve resection N/A 07/19/2012    Procedure: LAPAROSCOPIC SLEEVE GASTRECTOMY with EGD;  Surgeon: Brian Layton, DO;  Location: [facility];  Service: General;  Laterality: N/A;  laparoscopic sleeve gastrectomy with EGD  . Upper gi endoscopy N/A 07/19/2012    Procedure: UPPER GI ENDOSCOPY;  Surgeon: Brian Layton, DO;  Location: [facility];  Service: General;  Laterality: N/A;  . Esophagogastroduodenoscopy N/A 09/04/2012    Procedure: ESOPHAGOGASTRODUODENOSCOPY (EGD);  Surgeon: David H Newman, MD;  Location: [facility];  Service: General;  Laterality: N/A;  PF  . Esophagogastroduodenoscopy (egd) with esophageal dilation N/A 09/29/2012    Procedure: ESOPHAGOGASTRODUODENOSCOPY (EGD) WITH ESOPHAGEAL DILATION;    Surgeon: Daniel P Jacobs, MD;  Location: [facility];  Service: Endoscopy;  Laterality: N/A;    Allergies  Allergen Reactions  . Amlodipine     edema  . Clonidine Derivatives Swelling  . Welchol [Colesevelam Hcl] Nausea Only    Prior to Admission medications   Medication Sig Start Date End Date Taking? Authorizing Provider  allopurinol (ZYLOPRIM) 100 MG tablet Take 1 tablet (100 mg total) by mouth daily. Patient taking differently: Take 100 mg by mouth daily as needed (for gout flare ups).  09/13/14  Yes Hadriel Northup W John, MD  Aspirin-Acetaminophen-Caffeine (GOODY HEADACHE PO) Take 1 packet by mouth every 4 (four) hours as needed (for pain).   Yes Historical Provider, MD  losartan-hydrochlorothiazide (HYZAAR) 100-25  MG tablet Take 1 tablet by mouth daily. 12/31/14  Yes Thomas L Jones, MD  nebivolol (BYSTOLIC) 10 MG tablet TAKE 2 TABLETS (20 MG TOTAL) BY MOUTH DAILY. Patient taking differently: Take 10 mg by mouth 2 (two) times daily.  12/20/14  Yes Favor Kreh W John, MD    Social History   Social History  . Marital Status: Single    Spouse Name: N/A  . Number of Children: N/A  . Years of Education: N/A   Occupational History  . Not on file.   Social History Main Topics  . Smoking status: Former Smoker -- 20 years    Types: Cigarettes  . Smokeless tobacco: Never Used     Comment: QUIT SMOKING ABOUT 2009  . Alcohol Use: No  . Drug Use: No  . Sexual Activity: Not on file   Other Topics Concern  . Not on file   Social History Narrative     Family History  Problem Relation Age of Onset  . Hypertension Mother   . Diabetes Father   . Arthritis Other   . Hyperlipidemia Other   . Cancer Neg Hx   . Heart disease Neg Hx   . Kidney disease Neg Hx   . Stroke Neg Hx     ROS: [x] Positive   [ ] Negative   [ ] All sytems reviewed and are negative  Cardiovascular: [] chest pain/pressure [] palpitations [] SOB lying flat [x] DOE [] pain in legs while walking [] pain in legs at rest [] pain in legs at night [] non-healing ulcers [] hx of DVT [x] swelling in legs  Pulmonary: [] productive cough [] asthma/wheezing [] home O2 [x] headache with high blood pressure  Neurologic: [] weakness in [] arms [] legs [] numbness in [] arms [] legs [] hx of CVA [] mini stroke []difficulty speaking or slurred speech [] temporary loss of vision in one eye [] dizziness  Hematologic: [] hx of cancer [] bleeding problems [] problems with blood clotting easily  Endocrine:   [x] diabetes [] thyroid disease  GI [] vomiting blood [] blood in stool  GU: [x] CKD/renal failure [] HD--[] M/W/F or [] T/T/S [] burning with urination [] blood in urine  Psychiatric: [] anxiety [x]  depression  Musculoskeletal: [] arthritis [] joint pain [x] gout  Integumentary: [] rashes [] ulcers  Constitutional: [] fever [] chills   Physical Examination  Filed Vitals:   01/02/15 0455 01/02/15 1000  BP: 155/85 142/72  Pulse: 63 66  Temp: 98 F (36.7 C) 98.2 F (36.8 C)  Resp: 16 18   Body mass index is 48.43 kg/(m^2).  General:  WDWN in NAD Gait: Not observed HENT: WNL, normocephalic Pulmonary: normal non-labored breathing, without   Rales, rhonchi,  wheezing Cardiac: regular, without  Murmurs, rubs or gallops; without carotid bruits Abdomen:  obese Skin: without rashes Vascular Exam/Pulses:  Right Left  Radial 2+ (normal) 2+ (normal)  Ulnar Unable to palpate  Unable to palpate   DP 2+ (normal) 2+ (normal)  PT 1+ (weak) 1+ (weak)   Extremities: without ischemic changes, without Gangrene , without cellulitis; without open wounds; +BLE edema Musculoskeletal: no muscle wasting or atrophy  Neurologic: A&O X 3; Appropriate Affect ; SENSATION: normal; MOTOR FUNCTION:  moving all extremities equally. Speech is fluent/normal Psychiatric:  Normal affect capable of medical decision making   CBC    Component Value Date/Time   WBC 6.1 01/01/2015 1723   RBC 2.58* 01/01/2015 1723   HGB 7.9* 01/01/2015 1723   HCT 24.2* 01/01/2015 1723   PLT 298 01/01/2015 1723   MCV 93.8 01/01/2015 1723   MCH 30.6 01/01/2015 1723   MCHC 32.6 01/01/2015 1723   RDW 13.8 01/01/2015 1723   LYMPHSABS 1.6 01/01/2015 1723   MONOABS 0.7 01/01/2015 1723   EOSABS 1.5* 01/01/2015 1723   BASOSABS 0.0 01/01/2015 1723    BMET    Component Value Date/Time   NA 142 01/01/2015 1723   K 5.3* 01/01/2015 1723   CL 108 01/01/2015 1723   CO2 18* 01/01/2015 1723   GLUCOSE 87 01/01/2015 1723   BUN 107* 01/01/2015 1723   CREATININE 14.16* 01/01/2015 1723   CREATININE 2.28* 03/21/2012 0952   CALCIUM 7.3* 01/01/2015 1723   GFRNONAA 3* 01/01/2015 1723   GFRAA 3* 01/01/2015 1723     COAGS: No results found for: INR, PROTIME   Non-Invasive Vascular Imaging:   Vein mapping ordered  Statin:  No. Beta Blocker:  Yes.   Aspirin:  No. ACEI:  No. ARB:  No.-recently discontinued Other antiplatelets/anticoagulants:  Yes.   SQ heparin    ASSESSMENT/PLAN: This is a 49 y.o. female with progressive CKD vs AKI/CKD is in need of permanent HD access   -vein mapping has been ordered for today and I have spoken to Michelle in the vascular lab and they will do her vein mapping for today. -she is right hand dominant, so preference would be left arm fistula vs graft. -I did discuss the symptoms of steal syndrome and the possibility of needing further procedures in the future to maintain access with her and her husband and they both express understanding -pt is pre-op'd with exception of consent-await vein mapping   Samantha Rhyne, PA-C [facility] [phone]  Agree with above assessment I have examined patient and hopefully her veins in the left upper extremity will be satisfactory for left brachial cephalic or radial cephalic fistula creation We will plan left upper extremity fistula plus tunneled hemodialysis catheter tomorrow per Dr. [name] if schedule permits Patient's questions were answered and she would like to proceed If Vein mapping reveals right upper extremity would be a better choice than we will adjust to that. 

## 2015-01-03 NOTE — Progress Notes (Signed)
I was present at this dialysis session. I have reviewed the session itself and made appropriate changes.   QB 200.  [facility] working well.  No issues.    Pearson Grippe  MD 01/03/2015, 4:53 PM

## 2015-01-03 NOTE — Progress Notes (Signed)
Pt c/o headache,chest feeling heavy,nausea last night,oxygen placed on pt,Laura Larduk,PA-C notified see orders will continue to monitor

## 2015-01-03 NOTE — Anesthesia Procedure Notes (Signed)
Procedure Name: MAC Date/Time: 01/03/2015 11:55 AM Performed by: Eligha Bridegroom Pre-anesthesia Checklist: Patient identified, Timeout performed, Emergency Drugs available, Suction available and Patient being monitored Patient Re-evaluated:Patient Re-evaluated prior to inductionOxygen Delivery Method: Nasal cannula Preoxygenation: Pre-oxygenation with 100% oxygen Intubation Type: IV induction

## 2015-01-03 NOTE — Progress Notes (Signed)
[facility] PROGRESS NOTE  Brenda Davis FMB:[id number] DOB: 12/01/1965 DOA: 01/01/2015 PCP: Scarlette Calico, MD  Assessment/Plan: #1 progressive chronic kidney disease stage V versus acute on chronic kidney disease probably requiring hemodialysis Patient with complaints worrisome for progressive chronic kidney disease and on admission noted to have a creatinine of 13.88. Creatinine at 14.16 yesterday. Labs pending. Renal ultrasound negative for hydronephrosis. Patient has been seen in consultation by nephrology who feel patient likely has progressive chronic kidney disease, and may require hemodialysis. Serologies a urine studies have been ordered per nephrology. Vein mapping has been obtained on 01/02/2015 and vascular surgery has been consulted for placement of HD cath as well as aVF/AVG. ARB on hold. Patient has been placed on IV Lasix per nephrology. Patient for HD cath placement as well as fistula placement per vascular surgery today. Patient likely for hemodialysis today once access has been obtained. Per nephrology.  #2 anemia of chronic kidney disease Labs pending. Iron studies as well as SPEP/UPEP has been ordered per nephrology. Transfusion threshold hemoglobin less than 7.  #3 hypertension Stable. Continue bystolic and Lasix. Added low-dose Norvasc yesterday.  #4 hyperkalemia Secondary to problem #1. Patient has been started on IV diuretics per nephrology. Labs pending.  #5 diabetes mellitus II Hemoglobin A1c was 5.3 on 03/03/2013. Currently diet-controlled. Repeat a hemoglobin A1c. CBGs in 80s. Continue sliding scale insulin.  #6 prophylaxis Heparin for DVT prophylaxis.  Code Status: Full Family Communication: Updated patient and family at bedside. Disposition Plan: Per nephrology.   Consultants:  Nephrology: Dr. Marval Regal 01/02/2015  Vascular surgery: Dr. Kellie Simmering 01/02/2015  Procedures:  Vein mapping 01/02/2015  Renal ultrasound 01/02/2015 1.  Ultrasound-guided placement of 23 cm tunneled dialysis catheter--- per Dr. Scot Dock 01/03/2015 2. Left brachial cephalic AV fistula--- per Dr. Scot Dock 01/03/2015  Antibiotics:  None  HPI/Subjective: Patient states some improvement with lower extremity edema. Patient states had some good urine output. Patient complaining of chest heaviness.  Objective: Filed Vitals:   01/02/15 1900 01/03/15 0613  BP: 156/94 142/67  Pulse:  56  Temp:  98.3 F (36.8 C)  Resp:  18    Intake/Output Summary (Last 24 hours) at 01/03/15 0947 Last data filed at 01/02/15 1900  Gross per 24 hour  Intake    120 ml  Output    900 ml  Net   -780 ml   Filed Weights   01/01/15 2301  Weight: 131.997 kg (291 lb)    Exam:   General:  NAD  Cardiovascular: RRR  Respiratory: CTAB  Abdomen: Obese, soft, nondistended, nontender, positive bowel sounds.  Musculoskeletal: No clubbing or cyanosis. + bilateral lower extremity edema.  Data Reviewed: Basic Metabolic Panel:  Recent Labs Lab 12/31/14 1510 12/31/14 2004 01/01/15 1723 01/02/15 1815 01/02/15 2142 01/03/15 0402  NA 143 143 142  --  143 143  K 5.1 4.8 5.3*  --  4.5 4.8  CL 107 110 108  --  111 111  CO2 19 18* 18*  --  17* 19*  GLUCOSE 85 95 87  --  92 78  BUN 102* 76* 107*  --  104* 103*  CREATININE 13.33* 13.88* 14.16*  --  14.19* 14.40*  CALCIUM 7.4* 7.4* 7.3*  --  7.1* 7.3*  PHOS  --   --   --  10.1*  --  10.6*   Liver Function Tests:  Recent Labs Lab 12/31/14 1510 12/31/14 2004 01/03/15 0402  AST 10 11*  --   ALT 13 16  --  ALKPHOS 79 75  --   BILITOT 0.3 0.6  --   PROT 6.4 6.8  --   ALBUMIN 3.4* 3.4* 2.5*    Recent Labs Lab 12/31/14 2004  LIPASE 36   No results for input(s): AMMONIA in the last 168 hours. CBC:  Recent Labs Lab 12/31/14 1510 12/31/14 2004 01/01/15 1723 01/02/15 2142 01/03/15 0402  WBC 6.0 6.6 6.1 5.3 4.7  NEUTROABS 2.7  --  2.4  --   --   HGB 8.1 Repeated and verified X2.* 7.7* 7.9*  7.1* 7.2*  HCT 24.9 Repeated and verified X2.* 23.8* 24.2* 22.4* 22.5*  MCV 93.6 94.1 93.8 94.5 94.1  PLT 307.0 297 298 253 276   Cardiac Enzymes:  Recent Labs Lab 12/31/14 1510 01/02/15 2142 01/03/15 0402  CKTOTAL 146  --   --   CKMB 3.1  --   --   TROPONINI  --  <0.03 <0.03   BNP (last 3 results) No results for input(s): BNP in the last 8760 hours.  ProBNP (last 3 results)  Recent Labs  12/31/14 1510  PROBNP 477.0*    CBG:  Recent Labs Lab 01/02/15 1656 01/02/15 2017 01/03/15 0747  GLUCAP 87 81 70    No results found for this or any previous visit (from the past 240 hour(s)).   Studies: US Renal  01/02/2015  CLINICAL DATA:  Acute renal failure EXAM: RENAL / URINARY TRACT ULTRASOUND COMPLETE COMPARISON:  CT abdomen pelvis dated 09/02/2012 FINDINGS: Right Kidney: Length: 10.0 cm. Echogenic renal parenchyma. No mass or hydronephrosis. Left Kidney: Length: 10.1 cm. Echogenic renal parenchyma. No mass or hydronephrosis. Bladder: Mildly thick-walled although underdistended. IMPRESSION: Echogenic renal parenchyma, suggesting medical renal disease. No hydronephrosis. Electronically Signed   By: Julian Hy M.D.   On: 01/02/2015 14:09   Dg Chest Port 1 View  01/02/2015  CLINICAL DATA:  Chest pressure and shortness of breath tonight. Patient is in kidney failure. EXAM: PORTABLE CHEST 1 VIEW COMPARISON:  07/15/2012 FINDINGS: Shallow inspiration. Cardiac enlargement. Probable vascular crowding along the right heart border. No focal airspace disease or consolidation is demonstrated in the lungs. No blunting of costophrenic angles. No pneumothorax. Mediastinal contours are regular. IMPRESSION: Cardiac enlargement.  No evidence of active pulmonary disease. Electronically Signed   By: Lucienne Capers M.D.   On: 01/02/2015 22:00    Scheduled Meds: . [MAR Hold] amLODipine  5 mg Oral Daily  . cefUROXime (ZINACEF)  IV  1.5 g Intravenous To SSTC  . [MAR Hold] furosemide  80 mg  Intravenous BID  . [MAR Hold] heparin  5,000 Units Subcutaneous 3 times per day  . [MAR Hold] insulin aspart  0-9 Units Subcutaneous TID WC  . [MAR Hold] nebivolol  10 mg Oral BID   Continuous Infusions:   Principal Problem:   CKD (chronic kidney disease), stage V [facility]): Progressive Active Problems:   HYPERTENSION, BENIGN ESSENTIAL   CKD (chronic kidney disease) stage 5, GFR less than 15 ml/min (HCC)   Kidney failure   DM2 (diabetes mellitus, type 2) ([place])    Time spent: 10 minutes    Angelice Piech M.D. [facility] Pager [phone]. If 7PM-7AM, please contact night-coverage at [email or web address], password [facility] 01/03/2015, 9:47 AM  LOS: 2 days

## 2015-01-03 NOTE — Progress Notes (Signed)
Patient ID: Brenda Davis, female   DOB: 04-May-1965, 49 y.o.   MRN: [id number] S:gone for AVF creation O:BP 142/67 mmHg  Pulse 56  Temp(Src) 98.3 F (36.8 C) (Oral)  Resp 18  Ht 5\' 5"  (1.651 m)  Wt 131.997 kg (291 lb)  BMI 48.43 kg/m2  SpO2 99%  Intake/Output Summary (Last 24 hours) at 01/03/15 0903 Last data filed at 01/02/15 1900  Gross per 24 hour  Intake    120 ml  Output    900 ml  Net   -780 ml   Intake/Output: I/O last 3 completed shifts: In: 342 [P.O.:342] Out: 1500 [Urine:1500]  Intake/Output this shift:    Weight change:     Recent Labs Lab 12/31/14 1510 12/31/14 2004 01/01/15 1723 01/02/15 1815 01/02/15 2142 01/03/15 0402  NA 143 143 142  --  143 143  K 5.1 4.8 5.3*  --  4.5 4.8  CL 107 110 108  --  111 111  CO2 19 18* 18*  --  17* 19*  GLUCOSE 85 95 87  --  92 78  BUN 102* 76* 107*  --  104* 103*  CREATININE 13.33* 13.88* 14.16*  --  14.19* 14.40*  ALBUMIN 3.4* 3.4*  --   --   --  2.5*  CALCIUM 7.4* 7.4* 7.3*  --  7.1* 7.3*  PHOS  --   --   --  10.1*  --  10.6*  AST 10 11*  --   --   --   --   ALT 13 16  --   --   --   --    Liver Function Tests:  Recent Labs Lab 12/31/14 1510 12/31/14 2004 01/03/15 0402  AST 10 11*  --   ALT 13 16  --   ALKPHOS 79 75  --   BILITOT 0.3 0.6  --   PROT 6.4 6.8  --   ALBUMIN 3.4* 3.4* 2.5*    Recent Labs Lab 12/31/14 2004  LIPASE 36   No results for input(s): AMMONIA in the last 168 hours. CBC:  Recent Labs Lab 12/31/14 1510 12/31/14 2004 01/01/15 1723 01/02/15 2142 01/03/15 0402  WBC 6.0 6.6 6.1 5.3 4.7  NEUTROABS 2.7  --  2.4  --   --   HGB 8.1 Repeated and verified X2.* 7.7* 7.9* 7.1* 7.2*  HCT 24.9 Repeated and verified X2.* 23.8* 24.2* 22.4* 22.5*  MCV 93.6 94.1 93.8 94.5 94.1  PLT 307.0 297 298 253 276   Cardiac Enzymes:  Recent Labs Lab 12/31/14 1510 01/02/15 2142 01/03/15 0402  CKTOTAL 146  --   --   CKMB 3.1  --   --   TROPONINI  --  <0.03 <0.03   CBG:  Recent  Labs Lab 01/02/15 1656 01/02/15 2017 01/03/15 0747  GLUCAP 87 81 70    Iron Studies:  Recent Labs  12/31/14 1510  IRON 36*  TRANSFERRIN 242.0  FERRITIN 223.4   Studies/Results: US Renal  01/02/2015  CLINICAL DATA:  Acute renal failure EXAM: RENAL / URINARY TRACT ULTRASOUND COMPLETE COMPARISON:  CT abdomen pelvis dated 09/02/2012 FINDINGS: Right Kidney: Length: 10.0 cm. Echogenic renal parenchyma. No mass or hydronephrosis. Left Kidney: Length: 10.1 cm. Echogenic renal parenchyma. No mass or hydronephrosis. Bladder: Mildly thick-walled although underdistended. IMPRESSION: Echogenic renal parenchyma, suggesting medical renal disease. No hydronephrosis. Electronically Signed   By: Julian Hy M.D.   On: 01/02/2015 14:09   Dg Chest Port 1 View  01/02/2015  CLINICAL DATA:  Chest pressure and shortness of breath tonight. Patient is in kidney failure. EXAM: PORTABLE CHEST 1 VIEW COMPARISON:  07/15/2012 FINDINGS: Shallow inspiration. Cardiac enlargement. Probable vascular crowding along the right heart border. No focal airspace disease or consolidation is demonstrated in the lungs. No blunting of costophrenic angles. No pneumothorax. Mediastinal contours are regular. IMPRESSION: Cardiac enlargement.  No evidence of active pulmonary disease. Electronically Signed   By: Lucienne Capers M.D.   On: 01/02/2015 22:00   . amLODipine  5 mg Oral Daily  . cefUROXime (ZINACEF)  IV  1.5 g Intravenous To SSTC  . furosemide  80 mg Intravenous BID  . heparin  5,000 Units Subcutaneous 3 times per day  . insulin aspart  0-9 Units Subcutaneous TID WC  . nebivolol  10 mg Oral BID    BMET    Component Value Date/Time   NA 143 01/03/2015 0402   K 4.8 01/03/2015 0402   CL 111 01/03/2015 0402   CO2 19* 01/03/2015 0402   GLUCOSE 78 01/03/2015 0402   BUN 103* 01/03/2015 0402   CREATININE 14.40* 01/03/2015 0402   CREATININE 2.28* 03/21/2012 0952   CALCIUM 7.3* 01/03/2015 0402   GFRNONAA 3*  01/03/2015 0402   GFRAA 3* 01/03/2015 0402   CBC    Component Value Date/Time   WBC 4.7 01/03/2015 0402   RBC 2.39* 01/03/2015 0402   HGB 7.2* 01/03/2015 0402   HCT 22.5* 01/03/2015 0402   PLT 276 01/03/2015 0402   MCV 94.1 01/03/2015 0402   MCH 30.1 01/03/2015 0402   MCHC 32.0 01/03/2015 0402   RDW 13.5 01/03/2015 0402   LYMPHSABS 1.6 01/01/2015 1723   MONOABS 0.7 01/01/2015 1723   EOSABS 1.5* 01/01/2015 1723   BASOSABS 0.0 01/01/2015 1723     Assessment/Plan: 1. Progressive CKD vs AKI/CKD- given the duration of her poorly controlled DM and HTN, this is most consistent with progression to ESRD, however renal US was ordered to r/o hydro and evaluate size and number of kidneys. Will also order other serologies and urine studies, however given her history and presence of severe anemia, will treat as if she has progressed to ESRD.  1. s/p vein mapping and consult to [facility] for placement of HD catheter as well as AVF/AVG today.  2. Continue to educate patient and husband about renal replacement therapies but will start with incenter HD.  3. We did discuss home therapies but she will need to have permanent access placed and display better compliance with follow up and medications.  4. Will also hold ARB for now and follow renal function but plan on initiating HD after vascula access has been established. 5. Plan to initiate HD after IV access has been established later today 2. Anemia of chronic disease- low iron stores and SPEP/UPEP pending.  1. Will initiate ESA therapy with HD as well as IV Iron and follow 3. CKD-MBD- will check calcium, phos, and iPTH and initiate vitamin D therapy and phosphate binders as warrented 4. Hyperkalemia- due to #1. Will treat with IV lasix for now to improve K levels until she can have HD access placed. 5. Malignant HTN- improving with bystolic and will add lasix to help with volume overload until HD is initiated. 6. Vascular access- appreciate [facility]  consult and plan for access today  [name]

## 2015-01-03 NOTE — Interval H&P Note (Signed)
History and Physical Interval Note:  01/03/2015 11:00 AM  Brenda Davis  has presented today for surgery, with the diagnosis of End Stage Renal Disease N18.6  The various methods of treatment have been discussed with the patient and family. After consideration of risks, benefits and other options for treatment, the patient has consented to  Procedure(s): ARTERIOVENOUS (AV) FISTULA CREATION VERSUS GRAFT INSERTION (Left) INSERTION OF DIALYSIS CATHETER (N/A) as a surgical intervention .  The patient's history has been reviewed, patient examined, no change in status, stable for surgery.  I have reviewed the patient's chart and labs.  Questions were answered to the patient's satisfaction.     Deitra Mayo

## 2015-01-04 ENCOUNTER — Inpatient Hospital Stay (HOSPITAL_COMMUNITY): Payer: 59

## 2015-01-04 ENCOUNTER — Encounter (HOSPITAL_COMMUNITY): Payer: Self-pay | Admitting: Vascular Surgery

## 2015-01-04 ENCOUNTER — Other Ambulatory Visit: Payer: 59

## 2015-01-04 DIAGNOSIS — R001 Bradycardia, unspecified: Secondary | ICD-10-CM

## 2015-01-04 DIAGNOSIS — I10 Essential (primary) hypertension: Secondary | ICD-10-CM

## 2015-01-04 DIAGNOSIS — Z48812 Encounter for surgical aftercare following surgery on the circulatory system: Secondary | ICD-10-CM

## 2015-01-04 DIAGNOSIS — N186 End stage renal disease: Secondary | ICD-10-CM

## 2015-01-04 DIAGNOSIS — E1122 Type 2 diabetes mellitus with diabetic chronic kidney disease: Secondary | ICD-10-CM

## 2015-01-04 DIAGNOSIS — I455 Other specified heart block: Secondary | ICD-10-CM | POA: Clinically undetermined

## 2015-01-04 DIAGNOSIS — R0600 Dyspnea, unspecified: Secondary | ICD-10-CM

## 2015-01-04 DIAGNOSIS — N185 Chronic kidney disease, stage 5: Secondary | ICD-10-CM

## 2015-01-04 LAB — CBC
HCT: 27.9 % — ABNORMAL LOW (ref 36.0–46.0)
HCT: 27.5 % — ABNORMAL LOW (ref 36.0–46.0)
Hemoglobin: 9.3 g/dL — ABNORMAL LOW (ref 12.0–15.0)
Hemoglobin: 8.9 g/dL — ABNORMAL LOW (ref 12.0–15.0)
MCH: 30.9 pg (ref 26.0–34.0)
MCH: 29.8 pg (ref 26.0–34.0)
MCHC: 33.3 g/dL (ref 30.0–36.0)
MCHC: 32.4 g/dL (ref 30.0–36.0)
MCV: 92.7 fL (ref 78.0–100.0)
MCV: 92 fL (ref 78.0–100.0)
Platelets: 215 10*3/uL (ref 150–400)
Platelets: 236 10*3/uL (ref 150–400)
RBC: 3.01 MIL/uL — ABNORMAL LOW (ref 3.87–5.11)
RBC: 2.99 MIL/uL — ABNORMAL LOW (ref 3.87–5.11)
RDW: 14.7 % (ref 11.5–15.5)
RDW: 14.4 % (ref 11.5–15.5)
WBC: 4.7 10*3/uL (ref 4.0–10.5)
WBC: 4.7 10*3/uL (ref 4.0–10.5)

## 2015-01-04 LAB — RENAL FUNCTION PANEL
Albumin: 2.6 g/dL — ABNORMAL LOW (ref 3.5–5.0)
Albumin: 2.5 g/dL — ABNORMAL LOW (ref 3.5–5.0)
Anion gap: 12 (ref 5–15)
Anion gap: 14 (ref 5–15)
BUN: 66 mg/dL — ABNORMAL HIGH (ref 6–20)
BUN: 67 mg/dL — ABNORMAL HIGH (ref 6–20)
CO2: 24 mmol/L (ref 22–32)
CO2: 23 mmol/L (ref 22–32)
Calcium: 7.9 mg/dL — ABNORMAL LOW (ref 8.9–10.3)
Calcium: 8 mg/dL — ABNORMAL LOW (ref 8.9–10.3)
Chloride: 106 mmol/L (ref 101–111)
Chloride: 104 mmol/L (ref 101–111)
Creatinine, Ser: 10.63 mg/dL — ABNORMAL HIGH (ref 0.44–1.00)
Creatinine, Ser: 10.86 mg/dL — ABNORMAL HIGH (ref 0.44–1.00)
GFR calc Af Amer: 4 mL/min — ABNORMAL LOW (ref >60)
GFR calc Af Amer: 4 mL/min — ABNORMAL LOW (ref >60)
GFR calc non Af Amer: 4 mL/min — ABNORMAL LOW (ref >60)
GFR calc non Af Amer: 4 mL/min — ABNORMAL LOW (ref >60)
Glucose, Bld: 81 mg/dL (ref 65–99)
Glucose, Bld: 76 mg/dL (ref 65–99)
Phosphorus: 7.7 mg/dL — ABNORMAL HIGH (ref 2.5–4.6)
Phosphorus: 7.8 mg/dL — ABNORMAL HIGH (ref 2.5–4.6)
Potassium: 4.3 mmol/L (ref 3.5–5.1)
Potassium: 4 mmol/L (ref 3.5–5.1)
Sodium: 142 mmol/L (ref 135–145)
Sodium: 141 mmol/L (ref 135–145)

## 2015-01-04 LAB — GLUCOSE, CAPILLARY
Glucose-Capillary: 78 mg/dL (ref 65–99)
Glucose-Capillary: 74 mg/dL (ref 65–99)
Glucose-Capillary: 75 mg/dL (ref 65–99)
Glucose-Capillary: 70 mg/dL (ref 65–99)

## 2015-01-04 LAB — VITAMIN B1: Vitamin B1 (Thiamine): 13 nmol/L (ref 8–30)

## 2015-01-04 LAB — HEPATITIS B CORE ANTIBODY, TOTAL: Hep B Core Total Ab: NEGATIVE

## 2015-01-04 LAB — HEMOGLOBIN A1C
Hgb A1c MFr Bld: 5.2 % (ref 4.8–5.6)
Mean Plasma Glucose: 103 mg/dL

## 2015-01-04 LAB — HEPATITIS B SURFACE ANTIGEN: Hepatitis B Surface Ag: NEGATIVE

## 2015-01-04 LAB — HEPATITIS B SURFACE ANTIBODY,QUALITATIVE: Hep B S Ab: NONREACTIVE

## 2015-01-04 MED ORDER — HEPARIN SODIUM (PORCINE) 1000 UNIT/ML DIALYSIS: 20.0000 [IU]/kg | INTRAMUSCULAR | Status: DC | PRN

## 2015-01-04 MED ORDER — DARBEPOETIN ALFA 60 MCG/0.3ML IJ SOSY
PREFILLED_SYRINGE | INTRAMUSCULAR | Status: AC
  Filled 2015-01-04: qty 0.3

## 2015-01-04 MED ORDER — SODIUM BICARBONATE 650 MG PO TABS
650.0000 mg | ORAL_TABLET | Freq: Two times a day (BID) | ORAL | Status: DC
  Administered 2015-01-04 (×2): 650 mg via ORAL
  Filled 2015-01-04 (×2): qty 1

## 2015-01-04 MED ORDER — NEBIVOLOL HCL 5 MG PO TABS
5.0000 mg | ORAL_TABLET | Freq: Two times a day (BID) | ORAL | Status: DC
  Administered 2015-01-04 – 2015-01-07 (×6): 5 mg via ORAL
  Filled 2015-01-04 (×8): qty 1

## 2015-01-04 MED ORDER — SODIUM CHLORIDE 0.9 % IV SOLN
125.0000 mg | INTRAVENOUS | Status: DC
  Administered 2015-01-05: 125 mg via INTRAVENOUS
  Filled 2015-01-04 (×2): qty 10

## 2015-01-04 MED ORDER — DARBEPOETIN ALFA 60 MCG/0.3ML IJ SOSY
60.0000 ug | PREFILLED_SYRINGE | INTRAMUSCULAR | Status: DC
  Administered 2015-01-04: 60 ug via INTRAVENOUS
  Filled 2015-01-04: qty 0.3

## 2015-01-04 MED ORDER — CALCIUM ACETATE (PHOS BINDER) 667 MG PO CAPS
1334.0000 mg | ORAL_CAPSULE | Freq: Three times a day (TID) | ORAL | Status: DC
Start: 2015-01-04 — End: 2015-01-07
  Administered 2015-01-04 – 2015-01-07 (×6): 1334 mg via ORAL
  Filled 2015-01-04 (×6): qty 2

## 2015-01-04 MED ORDER — AMLODIPINE BESYLATE 10 MG PO TABS
10.0000 mg | ORAL_TABLET | Freq: Every day | ORAL | Status: DC
  Administered 2015-01-05 – 2015-01-07 (×3): 10 mg via ORAL
  Filled 2015-01-04 (×3): qty 1

## 2015-01-04 MED ORDER — ACETAMINOPHEN 325 MG PO TABS
650.0000 mg | ORAL_TABLET | ORAL | Status: DC | PRN
  Administered 2015-01-04 (×2): 650 mg via ORAL
  Filled 2015-01-04 (×2): qty 2

## 2015-01-04 NOTE — Consult Note (Signed)
Reason for Consult:  HR to 30    Referring Physician: Dr.Thompson PCP:  Scarlette Calico, MD  Primary Cardiologist:new  Brenda Davis is an 49 y.o. female.    Chief Complaint: admitted  01/01/15 with acute renal failure Cr. Of 13 BUN >51  HPI:  49 year old female that was admitted 01/01/15 for acute renal failure associated with edema, malaise and nausea.  She has a h/o DM2 not on meds for this following a gastric bypass procedure, HTN, gout, CKD that appears to have been stage 4 back in 2014 before she stopped seeing nephrology.  No hydronephrosis, plan for dialysis with renal. She is anemic with hgb at 7.9 on admit dropped to 6.8 and transfused. Now hgb at 8.9.  D-Dimer elevated at 1.96, TSH 2.56  She was placed on Lasix but plans for dialysis made.  She had placement of HD catheter and AVF/AVG.  She was had dialysis yesterday and planned for today and tomorrow.  Today after dialysis she had HR in the upper 50s then HR dropped to 30s with junct escape rhythm then SR and again another sinus and ventricular pause followed by junctional beat.    EKG on admit SR with Qtc 497 other wise no change from 2014.  Cr 14 and K+ 4.5 Today with pauses, labs at 2 pm with K+ 4.0, Cr. 10.86   Echo today: Study Conclusions  - Left ventricle: The cavity size was normal. Wall thickness was normal. Systolic function was vigorous. The estimated ejection fraction was in the range of 65% to 70%. Features are consistent with a pseudonormal left ventricular filling pattern, with concomitant abnormal relaxation and increased filling pressure (grade 2 diastolic dysfunction). - Mitral valve: There was mild regurgitation. - Pericardium, extracardiac: A trivial pericardial effusion was identified.   Pt stated now that for last 2 months going to work she feels like she would pass out but once at work it improves.  No chest pain.  With today's episode she was asleep.  No history of chest  pain or cardiac issues.   Past Medical History  Diagnosis Date  . Hypertension   . Diabetes mellitus   . Gout   . Depression   . Gout   . Hyperlipidemia   . Blood transfusion without reported diagnosis   . Morbid obesity ([place])   . Chronic kidney disease     STAGE 3 CHRONIC KIDNEY DISEASE SECONDARY TO DIABETIC GLOMERULOSCLEROSIS AND UNCONTROLLED HYPERTENSION - PER OFFICE NOTES DR. [facility].  Marland Kitchen Headache(784.0)     CHRONIC H/A'S  . Pain     LEFT SHOULDER PAIN - WAS SEEN AT AN URGENT CARE - GIVEN SLING FOR COMFORT AND TOLD ROM AS TOLERATED.    Past Surgical History  Procedure Laterality Date  . Cesarean section  1988, 1993  . Carpal tunnel release  2008  . Laparoscopic gastric sleeve resection N/A 07/19/2012    Procedure: LAPAROSCOPIC SLEEVE GASTRECTOMY with EGD;  Surgeon: Madilyn Hook, DO;  Location: [facility];  Service: General;  Laterality: N/A;  laparoscopic sleeve gastrectomy with EGD  . Upper gi endoscopy N/A 07/19/2012    Procedure: UPPER GI ENDOSCOPY;  Surgeon: Madilyn Hook, DO;  Location: [facility];  Service: General;  Laterality: N/A;  . Esophagogastroduodenoscopy N/A 09/04/2012    Procedure: ESOPHAGOGASTRODUODENOSCOPY (EGD);  Surgeon: Shann Medal, MD;  Location: [facility];  Service: General;  Laterality: N/A;  PF  . Esophagogastroduodenoscopy (egd) with esophageal  dilation N/A 09/29/2012    Procedure: ESOPHAGOGASTRODUODENOSCOPY (EGD) WITH ESOPHAGEAL DILATION;  Surgeon: Milus Banister, MD;  Location: [facility];  Service: Endoscopy;  Laterality: N/A;  . Av fistula placement Left 01/03/2015    Procedure: BRACHIAL CEPHALIC ARTERIOVENOUS  FISTULA CREATION LEFT ARM;  Surgeon: Angelia Mould, MD;  Location: [facility];  Service: Vascular;  Laterality: Left;  . Insertion of dialysis catheter N/A 01/03/2015    Procedure: INSERTION OF DIALYSIS CATHETER RIGHT INTERNAL JUGULAR;  Surgeon: Angelia Mould, MD;  Location: [facility] OR;  Service: Vascular;  Laterality:  N/A;    Family History  Problem Relation Age of Onset  . Hypertension Mother   . Diabetes Father   . Arthritis Other   . Hyperlipidemia Other   . Cancer Neg Hx   . Heart disease Neg Hx   . Kidney disease Neg Hx   . Stroke Neg Hx    Social History:  reports that she has quit smoking. Her smoking use included Cigarettes. She quit after 20 years of use. She has never used smokeless tobacco. She reports that she does not drink alcohol or use illicit drugs.  Allergies:  Allergies  Allergen Reactions  . Amlodipine     edema  . Clonidine Derivatives Swelling  . Welchol [Colesevelam Hcl] Nausea Only    OUTPATIENT MEDICATIONS: No current facility-administered medications on file prior to encounter.   Current Outpatient Prescriptions on File Prior to Encounter  Medication Sig Dispense Refill  . allopurinol (ZYLOPRIM) 100 MG tablet Take 1 tablet (100 mg total) by mouth daily. (Patient taking differently: Take 100 mg by mouth daily as needed (for gout flare ups). ) 90 tablet 3  . losartan-hydrochlorothiazide (HYZAAR) 100-25 MG tablet Take 1 tablet by mouth daily. 90 tablet 3  . nebivolol (BYSTOLIC) 10 MG tablet TAKE 2 TABLETS (20 MG TOTAL) BY MOUTH DAILY. (Patient taking differently: Take 10 mg by mouth 2 (two) times daily. ) 180 tablet 3  . [DISCONTINUED] colchicine 0.6 MG tablet Take 1 tablet (0.6 mg total) by mouth 2 (two) times daily. 20 tablet 1   Current Medications: Scheduled Meds: . sodium chloride   Intravenous Once  . acetaminophen  650 mg Oral Once  . amLODipine  5 mg Oral Daily  . calcium acetate  1,334 mg Oral TID WC  . Darbepoetin Alfa      . darbepoetin (ARANESP) injection - DIALYSIS  60 mcg Intravenous Q Fri-HD  . diphenhydrAMINE  25 mg Oral Once  . [START ON 01/05/2015] ferric gluconate (FERRLECIT/NULECIT) IV  125 mg Intravenous Q T,Th,Sa-HD  . furosemide  80 mg Intravenous BID  . heparin  5,000 Units Subcutaneous 3 times per day  . insulin aspart  0-9 Units  Subcutaneous TID WC  . nebivolol  10 mg Oral BID  . sodium bicarbonate  650 mg Oral BID   Continuous Infusions: . sodium chloride 10 mL/hr (01/03/15 1031)   PRN Meds:.acetaminophen, hydrALAZINE, ondansetron (ZOFRAN) IV, oxyCODONE   Results for orders placed or performed during the hospital encounter of 01/01/15 (from the past 48 hour(s))  Phosphorus     Status: Abnormal   Collection Time: 01/02/15  6:15 PM  Result Value Ref Range   Phosphorus 10.1 (H) 2.5 - 4.6 mg/dL  Protein electrophoresis, serum     Status: Abnormal   Collection Time: 01/02/15  6:15 PM  Result Value Ref Range   Total Protein ELP 5.0 (L) 6.0 - 8.5 g/dL   Albumin ELP 2.5 (L) 2.9 - 4.4  g/dL   Alpha-1-Globulin 0.3 0.0 - 0.4 g/dL   Alpha-2-Globulin 0.8 0.4 - 1.0 g/dL   Beta Globulin 0.9 0.7 - 1.3 g/dL   Gamma Globulin 0.5 0.4 - 1.8 g/dL   M-Spike, % Not Observed Not Observed g/dL   SPE Interp. Comment     Comment: (NOTE) The SPE pattern reflects hypoalbuminemia. Evidence of monoclonal protein is not apparent. Performed At: [facility] [place], [place] [id number] Lindon Romp MD ML:[id number]    Comment Comment     Comment: (NOTE) Protein electrophoresis scan will follow via computer, mail, or courier delivery.    GLOBULIN, TOTAL 2.5 2.2 - 3.9 g/dL   A/G Ratio 1.0 0.7 - 1.7  Hepatitis B surface antigen     Status: None   Collection Time: 01/02/15  6:15 PM  Result Value Ref Range   Hepatitis B Surface Ag Negative Negative    Comment: (NOTE) Performed At: [facility] [place], [place] [id number] Lindon Romp MD FV:[id number]   Hepatitis B surface antibody     Status: None   Collection Time: 01/02/15  6:15 PM  Result Value Ref Range   Hep B S Ab Non Reactive     Comment: (NOTE)              Non Reactive: Inconsistent with immunity,                            less than 10 mIU/mL              Reactive:     Consistent with immunity,                             greater than 9.9 mIU/mL Performed At: [facility] [place], [place] [id number] Lindon Romp MD JT:[id number]   Parathyroid hormone, intact (no Ca)     Status: Abnormal   Collection Time: 01/02/15  6:15 PM  Result Value Ref Range   PTH 265 (H) 15 - 65 pg/mL    Comment: (NOTE) Performed At: [facility] [address] [place], [place] [id number] Lindon Romp MD MA:[id number]   VITAMIN D 25 Hydroxy (Vit-D Deficiency, Fractures)     Status: Abnormal   Collection Time: 01/02/15  6:15 PM  Result Value Ref Range   Vit D, 25-Hydroxy 5.7 (L) 30.0 - 100.0 ng/mL    Comment: (NOTE) Vitamin D deficiency has been defined by the Institute of Medicine and an Endocrine Society practice guideline as a level of serum 25-OH vitamin D less than 20 ng/mL (1,2). The Endocrine Society went on to further define vitamin D insufficiency as a level between 21 and 29 ng/mL (2). 1. IOM (Institute of Medicine). 2010. Dietary reference   intakes for calcium and D. [place]: The   [employer]. 2. Holick MF, Binkley [place], Bischoff-Ferrari HA, et al.   Evaluation, treatment, and prevention of vitamin D   deficiency: an Endocrine Society clinical practice   guideline. JCEM. 2011 Jul; 96(7):1911-30. Performed At: [facility] [place], [place] [id number] Lindon Romp MD KA:[id number]   Glucose, capillary     Status: None   Collection Time: 01/02/15  8:17 PM  Result Value Ref Range   Glucose-Capillary 81 65 - 99 mg/dL  CBC     Status: Abnormal   Collection Time: 01/02/15  9:42 PM  Result Value Ref Range   WBC 5.3 4.0 - 10.5 K/uL   RBC 2.37 (L) 3.87 - 5.11 MIL/uL   Hemoglobin 7.1 (L) 12.0 - 15.0 g/dL   HCT 22.4 (L) 36.0 - 46.0 %   MCV 94.5 78.0 - 100.0 fL   MCH 30.0 26.0 - 34.0 pg   MCHC 31.7 30.0 - 36.0 g/dL   RDW 13.4 11.5 - 15.5 %   Platelets 253 150 - 400 K/uL  Basic metabolic panel     Status: Abnormal    Collection Time: 01/02/15  9:42 PM  Result Value Ref Range   Sodium 143 135 - 145 mmol/L   Potassium 4.5 3.5 - 5.1 mmol/L   Chloride 111 101 - 111 mmol/L   CO2 17 (L) 22 - 32 mmol/L   Glucose, Bld 92 65 - 99 mg/dL   BUN 104 (H) 6 - 20 mg/dL   Creatinine, Ser 14.19 (H) 0.44 - 1.00 mg/dL   Calcium 7.1 (L) 8.9 - 10.3 mg/dL   GFR calc non Af Amer 3 (L) >60 mL/min   GFR calc Af Amer 3 (L) >60 mL/min    Comment: (NOTE) The eGFR has been calculated using the CKD EPI equation. This calculation has not been validated in all clinical situations. eGFR's persistently <60 mL/min signify possible Chronic Kidney Disease.    Anion gap 15 5 - 15  Troponin I (q 6hr x 3)     Status: None   Collection Time: 01/02/15  9:42 PM  Result Value Ref Range   Troponin I <0.03 <0.031 ng/mL    Comment:        NO INDICATION OF MYOCARDIAL INJURY.   Renal function panel     Status: Abnormal   Collection Time: 01/03/15  4:02 AM  Result Value Ref Range   Sodium 143 135 - 145 mmol/L   Potassium 4.8 3.5 - 5.1 mmol/L   Chloride 111 101 - 111 mmol/L   CO2 19 (L) 22 - 32 mmol/L   Glucose, Bld 78 65 - 99 mg/dL   BUN 103 (H) 6 - 20 mg/dL   Creatinine, Ser 14.40 (H) 0.44 - 1.00 mg/dL   Calcium 7.3 (L) 8.9 - 10.3 mg/dL   Phosphorus 10.6 (H) 2.5 - 4.6 mg/dL   Albumin 2.5 (L) 3.5 - 5.0 g/dL   GFR calc non Af Amer 3 (L) >60 mL/min   GFR calc Af Amer 3 (L) >60 mL/min    Comment: (NOTE) The eGFR has been calculated using the CKD EPI equation. This calculation has not been validated in all clinical situations. eGFR's persistently <60 mL/min signify possible Chronic Kidney Disease.    Anion gap 13 5 - 15  CBC     Status: Abnormal   Collection Time: 01/03/15  4:02 AM  Result Value Ref Range   WBC 4.7 4.0 - 10.5 K/uL   RBC 2.39 (L) 3.87 - 5.11 MIL/uL   Hemoglobin 7.2 (L) 12.0 - 15.0 g/dL   HCT 22.5 (L) 36.0 - 46.0 %   MCV 94.1 78.0 - 100.0 fL   MCH 30.1 26.0 - 34.0 pg   MCHC 32.0 30.0 - 36.0 g/dL   RDW 13.5  11.5 - 15.5 %   Platelets 276 150 - 400 K/uL  Protime-INR     Status: None   Collection Time: 01/03/15  4:02 AM  Result Value Ref Range   Prothrombin Time 14.7 11.6 - 15.2 seconds   INR 1.13 0.00 - 1.49  Hemoglobin A1c     Status: None   Collection Time: 01/03/15  4:02 AM  Result Value Ref Range   Hgb A1c MFr Bld 5.2 4.8 - 5.6 %    Comment: (NOTE)         Pre-diabetes: 5.7 - 6.4         Diabetes: >6.4         Glycemic control for adults with diabetes: <7.0    Mean Plasma Glucose 103 mg/dL    Comment: (NOTE) Performed At: [facility] [place], [place] [id number] Lindon Romp MD KT:[id number]   Troponin I (q 6hr x 3)     Status: None   Collection Time: 01/03/15  4:02 AM  Result Value Ref Range   Troponin I <0.03 <0.031 ng/mL    Comment:        NO INDICATION OF MYOCARDIAL INJURY.   Glucose, capillary     Status: None   Collection Time: 01/03/15  7:47 AM  Result Value Ref Range   Glucose-Capillary 70 65 - 99 mg/dL  Surgical pcr screen     Status: None   Collection Time: 01/03/15  8:01 AM  Result Value Ref Range   MRSA, PCR NEGATIVE NEGATIVE   Staphylococcus aureus NEGATIVE NEGATIVE    Comment:        The Xpert SA Assay (FDA approved for NASAL specimens in patients over 33 years of age), is one component of a comprehensive surveillance program.  Test performance has been validated by [facility] for patients greater than or equal to 40 year old. It is not intended to diagnose infection nor to guide or monitor treatment.   Glucose, capillary     Status: None   Collection Time: 01/03/15 10:27 AM  Result Value Ref Range   Glucose-Capillary 83 65 - 99 mg/dL  Glucose, capillary     Status: None   Collection Time: 01/03/15  1:39 PM  Result Value Ref Range   Glucose-Capillary 82 65 - 99 mg/dL   Comment 1 Notify RN   Troponin I (q 6hr x 3)     Status: Abnormal   Collection Time: 01/03/15  4:50 PM  Result Value Ref Range   Troponin I 0.04  (H) <0.031 ng/mL    Comment:        PERSISTENTLY INCREASED TROPONIN VALUES IN THE RANGE OF 0.04-0.49 ng/mL CAN BE SEEN IN:       -UNSTABLE ANGINA       -CONGESTIVE HEART FAILURE       -MYOCARDITIS       -CHEST TRAUMA       -ARRYHTHMIAS       -LATE PRESENTING MYOCARDIAL INFARCTION       -COPD   CLINICAL FOLLOW-UP RECOMMENDED.   Vitamin B12     Status: None   Collection Time: 01/03/15  4:50 PM  Result Value Ref Range   Vitamin B-12 667 180 - 914 pg/mL    Comment: (NOTE) This assay is not validated for testing neonatal or myeloproliferative syndrome specimens for Vitamin B12 levels.   Folate     Status: None   Collection Time: 01/03/15  4:50 PM  Result Value Ref Range   Folate 6.9 >5.9 ng/mL  Iron and TIBC     Status: None   Collection Time: 01/03/15  4:50 PM  Result Value Ref Range   Iron 62 28 - 170 ug/dL   TIBC 290 250 - 450 ug/dL   Saturation Ratios 21 10.4 -  31.8 %   UIBC 228 ug/dL  Ferritin     Status: None   Collection Time: 01/03/15  4:50 PM  Result Value Ref Range   Ferritin 286 11 - 307 ng/mL  Renal function panel     Status: Abnormal   Collection Time: 01/03/15  4:50 PM  Result Value Ref Range   Sodium 143 135 - 145 mmol/L   Potassium 4.8 3.5 - 5.1 mmol/L   Chloride 111 101 - 111 mmol/L   CO2 17 (L) 22 - 32 mmol/L   Glucose, Bld 79 65 - 99 mg/dL   BUN 104 (H) 6 - 20 mg/dL   Creatinine, Ser 14.51 (H) 0.44 - 1.00 mg/dL   Calcium 7.7 (L) 8.9 - 10.3 mg/dL   Phosphorus 10.7 (H) 2.5 - 4.6 mg/dL   Albumin 2.8 (L) 3.5 - 5.0 g/dL   GFR calc non Af Amer 3 (L) >60 mL/min   GFR calc Af Amer 3 (L) >60 mL/min    Comment: (NOTE) The eGFR has been calculated using the CKD EPI equation. This calculation has not been validated in all clinical situations. eGFR's persistently <60 mL/min signify possible Chronic Kidney Disease.    Anion gap 15 5 - 15  CBC     Status: Abnormal   Collection Time: 01/03/15  4:50 PM  Result Value Ref Range   WBC 5.7 4.0 - 10.5 K/uL   RBC  2.29 (L) 3.87 - 5.11 MIL/uL   Hemoglobin 6.8 (LL) 12.0 - 15.0 g/dL    Comment: REPEATED TO VERIFY CRITICAL RESULT CALLED TO, READ BACK BY AND VERIFIED WITH: J.ADAMS,RN 1720 01/03/15 M.CAMPBELL    HCT 21.7 (L) 36.0 - 46.0 %   MCV 94.8 78.0 - 100.0 fL   MCH 29.7 26.0 - 34.0 pg   MCHC 31.3 30.0 - 36.0 g/dL   RDW 13.5 11.5 - 15.5 %   Platelets 282 150 - 400 K/uL  Type and screen [place]     Status: None (Preliminary result)   Collection Time: 01/03/15  5:40 PM  Result Value Ref Range   ABO/RH(D) B POS    Antibody Screen NEG    Sample Expiration 01/06/2015    Unit Number [id number]    Blood Component Type RED CELLS,LR    Unit division 00    Status of Unit ISSUED,FINAL    Transfusion Status OK TO TRANSFUSE    Crossmatch Result Compatible    Unit Number [id number]    Blood Component Type RED CELLS,LR    Unit division 00    Status of Unit ISSUED    Transfusion Status OK TO TRANSFUSE    Crossmatch Result Compatible   Prepare RBC     Status: None   Collection Time: 01/03/15  5:40 PM  Result Value Ref Range   Order Confirmation ORDER PROCESSED BY BLOOD BANK   ABO/Rh     Status: None   Collection Time: 01/03/15  5:40 PM  Result Value Ref Range   ABO/RH(D) B POS   Hepatitis B surface antigen     Status: None   Collection Time: 01/03/15  7:30 PM  Result Value Ref Range   Hepatitis B Surface Ag Negative Negative    Comment: (NOTE) Performed At: [facility] [address] [place], [place] [id number] Lindon Romp MD [id number]   Hepatitis B core antibody, total     Status: None   Collection Time: 01/03/15  7:30 PM  Result Value Ref Range   Hep  B Core Total Ab Negative Negative    Comment: (NOTE) Performed At: [facility] [place], [place] [id number] Lindon Romp MD [id number]   Hepatitis B surface antibody     Status: None   Collection Time: 01/03/15  7:30 PM  Result Value Ref Range   Hep B S Ab Non  Reactive     Comment: (NOTE)              Non Reactive: Inconsistent with immunity,                            less than 10 mIU/mL              Reactive:     Consistent with immunity,                            greater than 9.9 mIU/mL Performed At: [facility] [place], [place] [id number] Lindon Romp MD PY:[id number]   Glucose, capillary     Status: None   Collection Time: 01/03/15  8:26 PM  Result Value Ref Range   Glucose-Capillary 67 65 - 99 mg/dL  Glucose, capillary     Status: Abnormal   Collection Time: 01/03/15  9:07 PM  Result Value Ref Range   Glucose-Capillary 111 (H) 65 - 99 mg/dL  Glucose, capillary     Status: None   Collection Time: 01/04/15  7:29 AM  Result Value Ref Range   Glucose-Capillary 78 65 - 99 mg/dL  CBC     Status: Abnormal   Collection Time: 01/04/15  7:45 AM  Result Value Ref Range   WBC 4.7 4.0 - 10.5 K/uL   RBC 3.01 (L) 3.87 - 5.11 MIL/uL   Hemoglobin 9.3 (L) 12.0 - 15.0 g/dL    Comment: POST TRANSFUSION SPECIMEN   HCT 27.9 (L) 36.0 - 46.0 %   MCV 92.7 78.0 - 100.0 fL    Comment: POST TRANSFUSION SPECIMEN   MCH 30.9 26.0 - 34.0 pg   MCHC 33.3 30.0 - 36.0 g/dL   RDW 14.7 11.5 - 15.5 %   Platelets 215 150 - 400 K/uL  Renal function panel     Status: Abnormal   Collection Time: 01/04/15  7:45 AM  Result Value Ref Range   Sodium 142 135 - 145 mmol/L   Potassium 4.3 3.5 - 5.1 mmol/L   Chloride 106 101 - 111 mmol/L   CO2 24 22 - 32 mmol/L   Glucose, Bld 81 65 - 99 mg/dL   BUN 66 (H) 6 - 20 mg/dL   Creatinine, Ser 10.63 (H) 0.44 - 1.00 mg/dL   Calcium 7.9 (L) 8.9 - 10.3 mg/dL   Phosphorus 7.7 (H) 2.5 - 4.6 mg/dL   Albumin 2.6 (L) 3.5 - 5.0 g/dL   GFR calc non Af Amer 4 (L) >60 mL/min   GFR calc Af Amer 4 (L) >60 mL/min    Comment: (NOTE) The eGFR has been calculated using the CKD EPI equation. This calculation has not been validated in all clinical situations. eGFR's persistently <60 mL/min signify possible  Chronic Kidney Disease.    Anion gap 12 5 - 15  Glucose, capillary     Status: None   Collection Time: 01/04/15 11:30 AM  Result Value Ref Range   Glucose-Capillary 74 65 - 99 mg/dL  Renal function panel     Status: Abnormal  Collection Time: 01/04/15  2:10 PM  Result Value Ref Range   Sodium 141 135 - 145 mmol/L   Potassium 4.0 3.5 - 5.1 mmol/L   Chloride 104 101 - 111 mmol/L   CO2 23 22 - 32 mmol/L   Glucose, Bld 76 65 - 99 mg/dL   BUN 67 (H) 6 - 20 mg/dL   Creatinine, Ser 10.86 (H) 0.44 - 1.00 mg/dL   Calcium 8.0 (L) 8.9 - 10.3 mg/dL   Phosphorus 7.8 (H) 2.5 - 4.6 mg/dL   Albumin 2.5 (L) 3.5 - 5.0 g/dL   GFR calc non Af Amer 4 (L) >60 mL/min   GFR calc Af Amer 4 (L) >60 mL/min    Comment: (NOTE) The eGFR has been calculated using the CKD EPI equation. This calculation has not been validated in all clinical situations. eGFR's persistently <60 mL/min signify possible Chronic Kidney Disease.    Anion gap 14 5 - 15  CBC     Status: Abnormal   Collection Time: 01/04/15  2:10 PM  Result Value Ref Range   WBC 4.7 4.0 - 10.5 K/uL   RBC 2.99 (L) 3.87 - 5.11 MIL/uL   Hemoglobin 8.9 (L) 12.0 - 15.0 g/dL   HCT 27.5 (L) 36.0 - 46.0 %   MCV 92.0 78.0 - 100.0 fL   MCH 29.8 26.0 - 34.0 pg   MCHC 32.4 30.0 - 36.0 g/dL   RDW 14.4 11.5 - 15.5 %   Platelets 236 150 - 400 K/uL   Dg Chest Port 1 View  01/03/2015  CLINICAL DATA:  Dialysis catheter placement EXAM: PORTABLE CHEST 1 VIEW COMPARISON:  Chest radiograph from one day prior. FINDINGS: Right internal jugular central venous catheter terminates in the right atrium. Stable cardiomediastinal silhouette with mild cardiomegaly. No pneumothorax. No pleural effusion. Borderline mild pulmonary edema. No focal lung consolidation. IMPRESSION: 1. Right internal jugular central venous catheter terminates in the right atrium. No pneumothorax. 2. Stable mild cardiomegaly.  Borderline mild pulmonary edema. Electronically Signed   By: Ilona Sorrel  M.D.   On: 01/03/2015 13:51   Dg Chest Port 1 View  01/02/2015  CLINICAL DATA:  Chest pressure and shortness of breath tonight. Patient is in kidney failure. EXAM: PORTABLE CHEST 1 VIEW COMPARISON:  07/15/2012 FINDINGS: Shallow inspiration. Cardiac enlargement. Probable vascular crowding along the right heart border. No focal airspace disease or consolidation is demonstrated in the lungs. No blunting of costophrenic angles. No pneumothorax. Mediastinal contours are regular. IMPRESSION: Cardiac enlargement.  No evidence of active pulmonary disease. Electronically Signed   By: Lucienne Capers M.D.   On: 01/02/2015 22:00   Dg Fluoro Guide Cv Line-no Report  01/03/2015  CLINICAL DATA:  FLOURO GUIDE CV LINE Fluoroscopy was utilized by the requesting physician.  No radiographic interpretation.    ROS: General:no colds or fevers, no weight changes Skin:no rashes or ulcers HEENT:no blurred vision, no congestion CV:see HPI PUL:see HPI GI:no diarrhea constipation or melena, no indigestion GU:no hematuria, no dysuria MS:no joint pain, no claudication Neuro:no syncope, + lightheadedness over recent 2 mnths Endo:+ diabetes, no thyroid disease   Blood pressure 162/79, pulse 63, temperature 99.5 F (37.5 C), temperature source Oral, resp. rate 18, height 5' 5"  (1.651 m), weight 281 lb 12 oz (127.8 kg), SpO2 98 %.  Wt Readings from Last 3 Encounters:  01/04/15 281 lb 12 oz (127.8 kg)  12/31/14 290 lb (131.543 kg)  12/31/14 295 lb (133.811 kg)    PE: General:Pleasant affect, NAD, tearful that "  she let herself get in this shape". Skin:Warm and dry, brisk capillary refill HEENT:normocephalic, sclera clear, mucus membranes moist Neck:supple, no JVD, no bruits  Heart:S1S2 RRR without murmur, gallup, rub or click Lungs:clear without rales, rhonchi, or wheezes PMV:AEPN, non tender, + BS, do not palpate liver spleen or masses Ext:tr lower ext edema, 2+ pedal pulses, 2+ radial pulses Neuro:alert and  oriented X 3, MAE, follows commands, + facial symmetry    Assessment/Plan Principal Problem:   CKD (chronic kidney disease), stage V [facility]): Progressive Active Problems:   HYPERTENSION, BENIGN ESSENTIAL   CKD (chronic kidney disease) stage 5, GFR less than 15 ml/min (HCC)   Kidney failure   DM2 (diabetes mellitus, type 2) (HCC)   Chest pain   S/P dialysis catheter insertion (HCC)   Type 2 diabetes, HbA1c goal < 7% (HCC)   Sinus pause  Pauses pt is on bystolic will decrease dose to 5 mg BID unless MD prefers lower dose. Continue to monitor. Will increase amlodipine to 10 mg for HTN  [facility] R  Nurse Practitioner Certified [place] Pager [phone] or after 5pm or weekends call [phone] 01/04/2015, 5:51 PM     I have examined the patient and reviewed assessment and plan and discussed with patient.  Agree with above as stated.  Acute renal failure.  Poorly controlled HTN.  Bradycardia transiently during sleep.  Asymptomatic.  WIll decrease bystolic for now but the bradycardia may not be an issue going forward.  Increase amlodipine for BP control.  Overall, she feels better after starting dialysis.  Her heart rhythm may stabilize with more dialysis and more normal lab work.   Tihanna Goodson S.

## 2015-01-04 NOTE — Progress Notes (Signed)
[facility] PROGRESS NOTE  ISA HITZ NOM:[id number] DOB: [birth year] DOA: 01/01/2015 PCP: Scarlette Calico, MD  Assessment/Plan: #1 progressive chronic kidney disease stage V versus acute on chronic kidney disease probably requiring hemodialysis Patient with complaints worrisome for progressive chronic kidney disease and on admission noted to have a creatinine of 13.88. Creatinine at 14.16 yesterday. Labs pending. Renal ultrasound negative for hydronephrosis. Patient has been seen in consultation by nephrology who feel patient likely has progressive chronic kidney disease, and may require hemodialysis. Serologies a urine studies have been ordered per nephrology. Vein mapping has been obtained on 01/02/2015 and vascular surgery has been consulted for placement of HD cath as well as aVF/AVG. ARB on hold. Patient has been placed on IV Lasix per nephrology. Patient for HD cath placement as well as fistula placement per vascular surgery yesterday. Patient started hemodialysis with significant clinical improvement. Per nephrology.   #2 sinus pause/bradycardia  noted during hemodialysis. Patient was asymptomatic. Patient is not on a beta blocker. Patient is on  Norvasc which should not lead to significant bradycardia. 2-D echo was obtained during this hospitalization with a EF of 65-70% with grade 2 diastolic dysfunction, mild mitral valvular regurgitation. Will check a TSH. Cardiology consultation.  #3 anemia of chronic kidney disease Labs pending. Iron studies as well as SPEP/UPEP has been ordered per nephrology. Transfusion threshold hemoglobin less than 7.  #4 hypertension Stable. Continue bystolic, Lasix, Norvasc.  #5 hyperkalemia Secondary to problem #1.  Resolved. Patient has been started on IV diuretics and hemodialysis per nephrology.   #6 diabetes mellitus II Hemoglobin A1c is 5.2 on  01/03/2015. Currently diet-controlled. CBGs in 68s. D/C sliding scale insulin.  #7  prophylaxis Heparin for DVT prophylaxis.  Code Status: Full Family Communication: Updated patient and family at bedside. Disposition Plan: Per nephrology.   Consultants:  Nephrology: Dr. Marval Regal 01/02/2015  Vascular surgery: Dr. Kellie Simmering 01/02/2015   cardiology pending  Procedures:  Vein mapping 01/02/2015  Renal ultrasound 01/02/2015 1. Ultrasound-guided placement of 23 cm tunneled dialysis catheter--- per Dr. Scot Dock 01/03/2015 2. Left brachial cephalic AV fistula--- per Dr. Scot Dock 01/03/2015  Antibiotics:  None  HPI/Subjective: Patient  Just returned from hemodialysis. Patient states chest pressure significantly improved. Patient states shortness of breath and lower extremity edema significantly improved. The hemodialysis nurse during hemodialysis patient noted to have a sinus pause with bradycardia with heart rate in the 30s and asymptomatic.  Objective: Filed Vitals:   01/04/15 1600 01/04/15 1630  BP: 199/85 162/79  Pulse: 65 63  Temp:    Resp:      Intake/Output Summary (Last 24 hours) at 01/04/15 1751 Last data filed at 01/04/15 0900  Gross per 24 hour  Intake   1270 ml  Output   3200 ml  Net  -1930 ml   Filed Weights   01/03/15 1902 01/03/15 2033 01/04/15 1417  Weight: 128.3 kg (282 lb 13.6 oz) 127.8 kg (281 lb 12 oz) 127.8 kg (281 lb 12 oz)    Exam:   General:  NAD  Cardiovascular: RRR  Respiratory: CTAB  Abdomen: Obese, soft, nondistended, nontender, positive bowel sounds.  Musculoskeletal: No clubbing or cyanosis. trace bilateral lower extremity edema.  Data Reviewed: Basic Metabolic Panel:  Recent Labs Lab 01/02/15 1815 01/02/15 2142 01/03/15 0402 01/03/15 1650 01/04/15 0745 01/04/15 1410  NA  --  143 143 143 142 141  K  --  4.5 4.8 4.8 4.3 4.0  CL  --  111 111 111 106 104  CO2  --  17* 19* 17* 24 23  GLUCOSE  --  92 78 79 81 76  BUN  --  104* 103* 104* 66* 67*  CREATININE  --  14.19* 14.40* 14.51* 10.63* 10.86*  CALCIUM   --  7.1* 7.3* 7.7* 7.9* 8.0*  PHOS 10.1*  --  10.6* 10.7* 7.7* 7.8*   Liver Function Tests:  Recent Labs Lab 12/31/14 1510 12/31/14 2004 01/03/15 0402 01/03/15 1650 01/04/15 0745 01/04/15 1410  AST 10 11*  --   --   --   --   ALT 13 16  --   --   --   --   ALKPHOS 79 75  --   --   --   --   BILITOT 0.3 0.6  --   --   --   --   PROT 6.4 6.8  --   --   --   --   ALBUMIN 3.4* 3.4* 2.5* 2.8* 2.6* 2.5*    Recent Labs Lab 12/31/14 2004  LIPASE 36   No results for input(s): AMMONIA in the last 168 hours. CBC:  Recent Labs Lab 12/31/14 1510  01/01/15 1723 01/02/15 2142 01/03/15 0402 01/03/15 1650 01/04/15 0745 01/04/15 1410  WBC 6.0  < > 6.1 5.3 4.7 5.7 4.7 4.7  NEUTROABS 2.7  --  2.4  --   --   --   --   --   HGB 8.1 Repeated and verified X2.*  < > 7.9* 7.1* 7.2* 6.8* 9.3* 8.9*  HCT 24.9 Repeated and verified X2.*  < > 24.2* 22.4* 22.5* 21.7* 27.9* 27.5*  MCV 93.6  < > 93.8 94.5 94.1 94.8 92.7 92.0  PLT 307.0  < > 298 253 276 282 215 236  < > = values in this interval not displayed. Cardiac Enzymes:  Recent Labs Lab 12/31/14 1510 01/02/15 2142 01/03/15 0402 01/03/15 1650  CKTOTAL 146  --   --   --   CKMB 3.1  --   --   --   TROPONINI  --  <0.03 <0.03 0.04*   BNP (last 3 results) No results for input(s): BNP in the last 8760 hours.  ProBNP (last 3 results)  Recent Labs  12/31/14 1510  PROBNP 477.0*    CBG:  Recent Labs Lab 01/03/15 1339 01/03/15 2026 01/03/15 2107 01/04/15 0729 01/04/15 1130  GLUCAP 82 67 111* 78 74    Recent Results (from the past 240 hour(s))  Surgical pcr screen     Status: None   Collection Time: 01/03/15  8:01 AM  Result Value Ref Range Status   MRSA, PCR NEGATIVE NEGATIVE Final   Staphylococcus aureus NEGATIVE NEGATIVE Final    Comment:        The Xpert SA Assay (FDA approved for NASAL specimens in patients over 50 years of age), is one component of a comprehensive surveillance program.  Test performance  has been validated by [facility] for patients greater than or equal to 50 year old. It is not intended to diagnose infection nor to guide or monitor treatment.      Studies: Dg Chest Port 1 View  01/03/2015  CLINICAL DATA:  Dialysis catheter placement EXAM: PORTABLE CHEST 1 VIEW COMPARISON:  Chest radiograph from one day prior. FINDINGS: Right internal jugular central venous catheter terminates in the right atrium. Stable cardiomediastinal silhouette with mild cardiomegaly. No pneumothorax. No pleural effusion. Borderline mild pulmonary edema. No focal lung consolidation. IMPRESSION: 1. Right internal jugular central venous catheter terminates in the  right atrium. No pneumothorax. 2. Stable mild cardiomegaly.  Borderline mild pulmonary edema. Electronically Signed   By: Ilona Sorrel M.D.   On: 01/03/2015 13:51   Dg Chest Port 1 View  01/02/2015  CLINICAL DATA:  Chest pressure and shortness of breath tonight. Patient is in kidney failure. EXAM: PORTABLE CHEST 1 VIEW COMPARISON:  07/15/2012 FINDINGS: Shallow inspiration. Cardiac enlargement. Probable vascular crowding along the right heart border. No focal airspace disease or consolidation is demonstrated in the lungs. No blunting of costophrenic angles. No pneumothorax. Mediastinal contours are regular. IMPRESSION: Cardiac enlargement.  No evidence of active pulmonary disease. Electronically Signed   By: Lucienne Capers M.D.   On: 01/02/2015 22:00   Dg Fluoro Guide Cv Line-no Report  01/03/2015  CLINICAL DATA:  FLOURO GUIDE CV LINE Fluoroscopy was utilized by the requesting physician.  No radiographic interpretation.    Scheduled Meds: . sodium chloride   Intravenous Once  . acetaminophen  650 mg Oral Once  . amLODipine  5 mg Oral Daily  . calcium acetate  1,334 mg Oral TID WC  . Darbepoetin Alfa      . darbepoetin (ARANESP) injection - DIALYSIS  60 mcg Intravenous Q Fri-HD  . diphenhydrAMINE  25 mg Oral Once  . [START ON 01/05/2015]  ferric gluconate (FERRLECIT/NULECIT) IV  125 mg Intravenous Q T,Th,Sa-HD  . furosemide  80 mg Intravenous BID  . heparin  5,000 Units Subcutaneous 3 times per day  . insulin aspart  0-9 Units Subcutaneous TID WC  . nebivolol  10 mg Oral BID  . sodium bicarbonate  650 mg Oral BID   Continuous Infusions: . sodium chloride 10 mL/hr (01/03/15 1031)    Principal Problem:   CKD (chronic kidney disease), stage V [facility]): Progressive Active Problems:   HYPERTENSION, BENIGN ESSENTIAL   CKD (chronic kidney disease) stage 5, GFR less than 15 ml/min (HCC)   Kidney failure   DM2 (diabetes mellitus, type 2) (HCC)   Chest pain   S/P dialysis catheter insertion (HCC)   Type 2 diabetes, HbA1c goal < 7% (HCC)   Sinus pause    Time spent: 43 minutes    THOMPSON,DANIEL M.D. [facility] Pager [phone]. If 7PM-7AM, please contact night-coverage at [email or web address], password [facility] 01/04/2015, 5:51 PM  LOS: 3 days

## 2015-01-04 NOTE — Progress Notes (Signed)
Patient ID: Brenda Davis, female   DOB: June 29, 1965, 49 y.o.   MRN: [id number] S:Feels better after HD yesterday O:BP 157/67 mmHg  Pulse 68  Temp(Src) 98.8 F (37.1 C) (Oral)  Resp 18  Ht 5\' 5"  (1.651 m)  Wt 127.8 kg (281 lb 12 oz)  BMI 46.89 kg/m2  SpO2 98%  Intake/Output Summary (Last 24 hours) at 01/04/15 0825 Last data filed at 01/04/15 0432  Gross per 24 hour  Intake   1230 ml  Output   2900 ml  Net  -1670 ml   Intake/Output: I/O last 3 completed shifts: In: 1230 [P.O.:360; I.V.:200; Blood:670] Out: 2900 [Urine:900; Other:2000]  Intake/Output this shift:    Weight change:  Gen:WD obese AAF in NAd CVS:no rub Resp:cta [id number] Ext:1+ edema lower ext, LUE AVF +T/B   Recent Labs Lab 12/31/14 1510 12/31/14 2004 01/01/15 1723 01/02/15 1815 01/02/15 2142 01/03/15 0402 01/03/15 1650  NA 143 143 142  --  143 143 143  K 5.1 4.8 5.3*  --  4.5 4.8 4.8  CL 107 110 108  --  111 111 111  CO2 19 18* 18*  --  17* 19* 17*  GLUCOSE 85 95 87  --  92 78 79  BUN 102* 76* 107*  --  104* 103* 104*  CREATININE 13.33* 13.88* 14.16*  --  14.19* 14.40* 14.51*  ALBUMIN 3.4* 3.4*  --   --   --  2.5* 2.8*  CALCIUM 7.4* 7.4* 7.3*  --  7.1* 7.3* 7.7*  PHOS  --   --   --  10.1*  --  10.6* 10.7*  AST 10 11*  --   --   --   --   --   ALT 13 16  --   --   --   --   --    Liver Function Tests:  Recent Labs Lab 12/31/14 1510 12/31/14 2004 01/03/15 0402 01/03/15 1650  AST 10 11*  --   --   ALT 13 16  --   --   ALKPHOS 79 75  --   --   BILITOT 0.3 0.6  --   --   PROT 6.4 6.8  --   --   ALBUMIN 3.4* 3.4* 2.5* 2.8*    Recent Labs Lab 12/31/14 2004  LIPASE 36   No results for input(s): AMMONIA in the last 168 hours. CBC:  Recent Labs Lab 12/31/14 1510 12/31/14 2004 01/01/15 1723 01/02/15 2142 01/03/15 0402 01/03/15 1650  WBC 6.0 6.6 6.1 5.3 4.7 5.7  NEUTROABS 2.7  --  2.4  --   --   --   HGB 8.1 Repeated and verified X2.* 7.7* 7.9* 7.1* 7.2* 6.8*  HCT 24.9  Repeated and verified X2.* 23.8* 24.2* 22.4* 22.5* 21.7*  MCV 93.6 94.1 93.8 94.5 94.1 94.8  PLT 307.0 297 298 253 276 282   Cardiac Enzymes:  Recent Labs Lab 12/31/14 1510 01/02/15 2142 01/03/15 0402 01/03/15 1650  CKTOTAL 146  --   --   --   CKMB 3.1  --   --   --   TROPONINI  --  <0.03 <0.03 0.04*   CBG:  Recent Labs Lab 01/03/15 1027 01/03/15 1339 01/03/15 2026 01/03/15 2107 01/04/15 0729  GLUCAP 83 82 67 111* 78    Iron Studies:  Recent Labs  01/03/15 1650  IRON 62  TIBC 290  FERRITIN 286   Studies/Results: US Renal  01/02/2015  CLINICAL DATA:  Acute renal failure EXAM:  RENAL / URINARY TRACT ULTRASOUND COMPLETE COMPARISON:  CT abdomen pelvis dated 09/02/2012 FINDINGS: Right Kidney: Length: 10.0 cm. Echogenic renal parenchyma. No mass or hydronephrosis. Left Kidney: Length: 10.1 cm. Echogenic renal parenchyma. No mass or hydronephrosis. Bladder: Mildly thick-walled although underdistended. IMPRESSION: Echogenic renal parenchyma, suggesting medical renal disease. No hydronephrosis. Electronically Signed   By: Julian Hy M.D.   On: 01/02/2015 14:09   Dg Chest Port 1 View  01/03/2015  CLINICAL DATA:  Dialysis catheter placement EXAM: PORTABLE CHEST 1 VIEW COMPARISON:  Chest radiograph from one day prior. FINDINGS: Right internal jugular central venous catheter terminates in the right atrium. Stable cardiomediastinal silhouette with mild cardiomegaly. No pneumothorax. No pleural effusion. Borderline mild pulmonary edema. No focal lung consolidation. IMPRESSION: 1. Right internal jugular central venous catheter terminates in the right atrium. No pneumothorax. 2. Stable mild cardiomegaly.  Borderline mild pulmonary edema. Electronically Signed   By: Ilona Sorrel M.D.   On: 01/03/2015 13:51   Dg Chest Port 1 View  01/02/2015  CLINICAL DATA:  Chest pressure and shortness of breath tonight. Patient is in kidney failure. EXAM: PORTABLE CHEST 1 VIEW COMPARISON:   07/15/2012 FINDINGS: Shallow inspiration. Cardiac enlargement. Probable vascular crowding along the right heart border. No focal airspace disease or consolidation is demonstrated in the lungs. No blunting of costophrenic angles. No pneumothorax. Mediastinal contours are regular. IMPRESSION: Cardiac enlargement.  No evidence of active pulmonary disease. Electronically Signed   By: Lucienne Capers M.D.   On: 01/02/2015 22:00   Dg Fluoro Guide Cv Line-no Report  01/03/2015  CLINICAL DATA:  FLOURO GUIDE CV LINE Fluoroscopy was utilized by the requesting physician.  No radiographic interpretation.   . sodium chloride   Intravenous Once  . acetaminophen  650 mg Oral Once  . amLODipine  5 mg Oral Daily  . diphenhydrAMINE  25 mg Oral Once  . furosemide  80 mg Intravenous BID  . heparin  5,000 Units Subcutaneous 3 times per day  . insulin aspart  0-9 Units Subcutaneous TID WC  . nebivolol  10 mg Oral BID    BMET    Component Value Date/Time   NA 143 01/03/2015 1650   K 4.8 01/03/2015 1650   CL 111 01/03/2015 1650   CO2 17* 01/03/2015 1650   GLUCOSE 79 01/03/2015 1650   BUN 104* 01/03/2015 1650   CREATININE 14.51* 01/03/2015 1650   CREATININE 2.28* 03/21/2012 0952   CALCIUM 7.7* 01/03/2015 1650   GFRNONAA 3* 01/03/2015 1650   GFRAA 3* 01/03/2015 1650   CBC    Component Value Date/Time   WBC 5.7 01/03/2015 1650   RBC 2.29* 01/03/2015 1650   HGB 6.8* 01/03/2015 1650   HCT 21.7* 01/03/2015 1650   PLT 282 01/03/2015 1650   MCV 94.8 01/03/2015 1650   MCH 29.7 01/03/2015 1650   MCHC 31.3 01/03/2015 1650   RDW 13.5 01/03/2015 1650   LYMPHSABS 1.6 01/01/2015 1723   MONOABS 0.7 01/01/2015 1723   EOSABS 1.5* 01/01/2015 1723   BASOSABS 0.0 01/01/2015 1723     Assessment/Plan: 1. Progressive CKD vs AKI/CKD- given the duration of her poorly controlled DM and HTN, this is most consistent with progression to ESRD, however renal US was ordered to r/o hydro and evaluate size and number of  kidneys. Will also order other serologies and urine studies, however given her history and presence of severe anemia, will treat as if she has progressed to ESRD.  1. s/p vein L BC AVF and RIJ TDC  01/03/15 by Dr. Scot Dock  2. Continue to educate patient and husband about renal replacement therapies but will start with incenter HD.  3. We did discuss home therapies but she will need to have permanent access placed and display better compliance with follow up and medications.  4. Will also hold ARB for now and follow renal function but plan on initiating HD after vascula access has been established. 5. First session of HD 01/03/15 and will plan for daily HD for next 2 days 6. Await outpatient placement for IHD 2. Anemia of chronic disease- low iron stores and SPEP/UPEP pending.  1. Will initiate ESA therapy with HD as well as IV Iron and follow 3. CKD-MBD- will check calcium, phos, and iPTH and initiate vitamin D therapy and phosphate binders as warrented 4. Hyperkalemia- due to #1. Will treat with IV lasix for now to improve K levels until she can have HD access placed. 5. Malignant HTN- improving with bystolic and will add lasix to help with volume overload until HD is initiated. 6. Vascular access- s/p L BC AVF and RIJ TDC 01/03/15 by Dr. Scot Dock 7. Disposition- will need to have outpatient HD arranged.  Discussed 3rd shift as she is still working as well as nocturnal HD and she is interested in both.  CLIP process started yesterday.  [place] A

## 2015-01-04 NOTE — Progress Notes (Signed)
Hypoglycemic Event  CBG: 67  Treatment: 15 GM carbohydrate snack  Symptoms: None  Follow-up CBG: Time:21:07 CBG Result:111  Possible Reasons for Event: Inadequate meal intake  Comments/MD notified: Patient given snacks to help with inadequate meal intake.    Lucretia Field, Keirra Zeimet

## 2015-01-04 NOTE — Progress Notes (Addendum)
  Postoperative hemodialysis access     Date of Surgery:  01/03/15 Surgeon: Scot Dock  Subjective:  C/o soreness left arm and catheter site; denies any steal sx.  PHYSICAL EXAMINATION:  Filed Vitals:   01/04/15 0138 01/04/15 0432  BP: 175/72 157/67  Pulse: 71 68  Temp: 98.9 F (37.2 C) 98.8 F (37.1 C)  Resp: 18 18    Incision is c/d/i Sensation in digits is intact;  There is  Thrill  There is bruit. The graft/fistula is palpable  2+ left radial pulse   ASSESSMENT/PLAN:  Brenda Davis is a 49 y.o. year old female who is s/p left brachiocephalic AVF and diatek catheter placement.  -graft/fistula is patent -pt does not have evidence of steal sx -f/u with Dr. Scot Dock in 4-6 weeks to check maturation of AVF -will sign off-call as needed.   Leontine Locket, PA-C [facility] [phone]  Agree with above.   Deitra Mayo, MD, [place] [phone] Office: [phone]

## 2015-01-04 NOTE — Progress Notes (Signed)
Pt's BP 179/88 on return from HD. PRN orders for hydralazine if SBP > 180. Pt did not receive antihypertensives before HD. Seen by cardio for abnormal heart rhythms during HD and meds adjusted accordingly. Notified Public house manager.

## 2015-01-04 NOTE — Progress Notes (Signed)
  Echocardiogram 2D Echocardiogram has been performed.  Jennette Dubin 01/04/2015, 12:06 PM

## 2015-01-05 LAB — CBC
HCT: 30.9 % — ABNORMAL LOW (ref 36.0–46.0)
Hemoglobin: 9.7 g/dL — ABNORMAL LOW (ref 12.0–15.0)
MCH: 29.2 pg (ref 26.0–34.0)
MCHC: 31.4 g/dL (ref 30.0–36.0)
MCV: 93.1 fL (ref 78.0–100.0)
Platelets: 216 10*3/uL (ref 150–400)
RBC: 3.32 MIL/uL — ABNORMAL LOW (ref 3.87–5.11)
RDW: 14.4 % (ref 11.5–15.5)
WBC: 5.8 10*3/uL (ref 4.0–10.5)

## 2015-01-05 LAB — RENAL FUNCTION PANEL
Albumin: 2.4 g/dL — ABNORMAL LOW (ref 3.5–5.0)
Anion gap: 13 (ref 5–15)
BUN: 38 mg/dL — ABNORMAL HIGH (ref 6–20)
CO2: 28 mmol/L (ref 22–32)
Calcium: 8.2 mg/dL — ABNORMAL LOW (ref 8.9–10.3)
Chloride: 98 mmol/L — ABNORMAL LOW (ref 101–111)
Creatinine, Ser: 8.26 mg/dL — ABNORMAL HIGH (ref 0.44–1.00)
GFR calc Af Amer: 6 mL/min — ABNORMAL LOW (ref >60)
GFR calc non Af Amer: 5 mL/min — ABNORMAL LOW (ref >60)
Glucose, Bld: 72 mg/dL (ref 65–99)
Phosphorus: 6.1 mg/dL — ABNORMAL HIGH (ref 2.5–4.6)
Potassium: 3.6 mmol/L (ref 3.5–5.1)
Sodium: 139 mmol/L (ref 135–145)

## 2015-01-05 LAB — TYPE AND SCREEN
ABO/RH(D): B POS
Antibody Screen: NEGATIVE
Unit division: 0
Unit division: 0

## 2015-01-05 LAB — GLUCOSE, CAPILLARY
Glucose-Capillary: 64 mg/dL — ABNORMAL LOW (ref 65–99)
Glucose-Capillary: 101 mg/dL — ABNORMAL HIGH (ref 65–99)
Glucose-Capillary: 64 mg/dL — ABNORMAL LOW (ref 65–99)

## 2015-01-05 LAB — PARATHYROID HORMONE, INTACT (NO CA): PTH: 494 pg/mL — ABNORMAL HIGH (ref 15–65)

## 2015-01-05 LAB — MAGNESIUM: Magnesium: 1.9 mg/dL (ref 1.7–2.4)

## 2015-01-05 LAB — TSH: TSH: 2.638 u[IU]/mL (ref 0.350–4.500)

## 2015-01-05 MED ORDER — IRBESARTAN 300 MG PO TABS
300.0000 mg | ORAL_TABLET | Freq: Every day | ORAL | Status: DC
  Administered 2015-01-05 – 2015-01-06 (×2): 300 mg via ORAL
  Filled 2015-01-05 (×4): qty 1

## 2015-01-05 MED ORDER — HEPARIN SODIUM (PORCINE) 1000 UNIT/ML DIALYSIS: 20.0000 [IU]/kg | INTRAMUSCULAR | Status: DC | PRN

## 2015-01-05 MED ORDER — HYDROXYZINE HCL 25 MG PO TABS
25.0000 mg | ORAL_TABLET | Freq: Three times a day (TID) | ORAL | Status: DC | PRN
  Administered 2015-01-05: 25 mg via ORAL
  Filled 2015-01-05: qty 1

## 2015-01-05 NOTE — Progress Notes (Signed)
Patient ID: Brenda Davis, female   DOB: 26-Mar-1965, 49 y.o.   MRN: [id number] S:appreciate Cardiology's input regarding bradycardia O:BP 173/74 mmHg  Pulse 64  Temp(Src) 98.9 F (37.2 C) (Oral)  Resp 18  Ht 5\' 5"  (1.651 m)  Wt 125.8 kg (277 lb 5.4 oz)  BMI 46.15 kg/m2  SpO2 95%  Intake/Output Summary (Last 24 hours) at 01/05/15 0932 Last data filed at 01/05/15 0500  Gross per 24 hour  Intake    240 ml  Output   3600 ml  Net  -3360 ml   Intake/Output: I/O last 3 completed shifts: In: 1510 [P.O.:840; Blood:670] Out: 66 [Urine:2800; Other:4000]  Intake/Output this shift:    Weight change: -2.4 kg (-5 lb 4.7 oz) Gen:WD WN AAF  CVS:RRR no rub Resp:cta [id number] Ext:tr pretibial edema, LUE AVF +T/B   Recent Labs Lab 12/31/14 1510 12/31/14 2004 01/01/15 1723 01/02/15 1815 01/02/15 2142 01/03/15 0402 01/03/15 1650 01/04/15 0745 01/04/15 1410 01/05/15 0610  NA 143 143 142  --  143 143 143 142 141 139  K 5.1 4.8 5.3*  --  4.5 4.8 4.8 4.3 4.0 3.6  CL 107 110 108  --  111 111 111 106 104 98*  CO2 19 18* 18*  --  17* 19* 17* 24 23 28   GLUCOSE 85 95 87  --  92 78 79 81 76 72  BUN 102* 76* 107*  --  104* 103* 104* 66* 67* 38*  CREATININE 13.33* 13.88* 14.16*  --  14.19* 14.40* 14.51* 10.63* 10.86* 8.26*  ALBUMIN 3.4* 3.4*  --   --   --  2.5* 2.8* 2.6* 2.5* 2.4*  CALCIUM 7.4* 7.4* 7.3*  --  7.1* 7.3* 7.7* 7.9* 8.0* 8.2*  PHOS  --   --   --  10.1*  --  10.6* 10.7* 7.7* 7.8* 6.1*  AST 10 11*  --   --   --   --   --   --   --   --   ALT 13 16  --   --   --   --   --   --   --   --    Liver Function Tests:  Recent Labs Lab 12/31/14 1510 12/31/14 2004  01/04/15 0745 01/04/15 1410 01/05/15 0610  AST 10 11*  --   --   --   --   ALT 13 16  --   --   --   --   ALKPHOS 79 75  --   --   --   --   BILITOT 0.3 0.6  --   --   --   --   PROT 6.4 6.8  --   --   --   --   ALBUMIN 3.4* 3.4*  < > 2.6* 2.5* 2.4*  < > = values in this interval not displayed.  Recent  Labs Lab 12/31/14 2004  LIPASE 36   No results for input(s): AMMONIA in the last 168 hours. CBC:  Recent Labs Lab 12/31/14 1510  01/01/15 1723  01/03/15 0402 01/03/15 1650 01/04/15 0745 01/04/15 1410 01/05/15 0610  WBC 6.0  < > 6.1  < > 4.7 5.7 4.7 4.7 5.8  NEUTROABS 2.7  --  2.4  --   --   --   --   --   --   HGB 8.1 Repeated and verified X2.*  < > 7.9*  < > 7.2* 6.8* 9.3* 8.9* 9.7*  HCT 24.9 Repeated and  verified X2.*  < > 24.2*  < > 22.5* 21.7* 27.9* 27.5* 30.9*  MCV 93.6  < > 93.8  < > 94.1 94.8 92.7 92.0 93.1  PLT 307.0  < > 298  < > 276 282 215 236 216  < > = values in this interval not displayed. Cardiac Enzymes:  Recent Labs Lab 12/31/14 1510 01/02/15 2142 01/03/15 0402 01/03/15 1650  CKTOTAL 146  --   --   --   CKMB 3.1  --   --   --   TROPONINI  --  <0.03 <0.03 0.04*   CBG:  Recent Labs Lab 01/04/15 0729 01/04/15 1130 01/04/15 1801 01/04/15 2121 01/05/15 0726  GLUCAP 78 74 75 70 64*    Iron Studies:  Recent Labs  01/03/15 1650  IRON 62  TIBC 290  FERRITIN 286   Studies/Results: Dg Chest Port 1 View  01/03/2015  CLINICAL DATA:  Dialysis catheter placement EXAM: PORTABLE CHEST 1 VIEW COMPARISON:  Chest radiograph from one day prior. FINDINGS: Right internal jugular central venous catheter terminates in the right atrium. Stable cardiomediastinal silhouette with mild cardiomegaly. No pneumothorax. No pleural effusion. Borderline mild pulmonary edema. No focal lung consolidation. IMPRESSION: 1. Right internal jugular central venous catheter terminates in the right atrium. No pneumothorax. 2. Stable mild cardiomegaly.  Borderline mild pulmonary edema. Electronically Signed   By: Ilona Sorrel M.D.   On: 01/03/2015 13:51   Dg Fluoro Guide Cv Line-no Report  01/03/2015  CLINICAL DATA:  FLOURO GUIDE CV LINE Fluoroscopy was utilized by the requesting physician.  No radiographic interpretation.   . sodium chloride   Intravenous Once  . acetaminophen   650 mg Oral Once  . amLODipine  10 mg Oral Daily  . calcium acetate  1,334 mg Oral TID WC  . darbepoetin (ARANESP) injection - DIALYSIS  60 mcg Intravenous Q Fri-HD  . diphenhydrAMINE  25 mg Oral Once  . ferric gluconate (FERRLECIT/NULECIT) IV  125 mg Intravenous Q T,Th,Sa-HD  . furosemide  80 mg Intravenous BID  . heparin  5,000 Units Subcutaneous 3 times per day  . nebivolol  5 mg Oral BID    BMET    Component Value Date/Time   NA 139 01/05/2015 0610   K 3.6 01/05/2015 0610   CL 98* 01/05/2015 0610   CO2 28 01/05/2015 0610   GLUCOSE 72 01/05/2015 0610   BUN 38* 01/05/2015 0610   CREATININE 8.26* 01/05/2015 0610   CREATININE 2.28* 03/21/2012 0952   CALCIUM 8.2* 01/05/2015 0610   GFRNONAA 5* 01/05/2015 0610   GFRAA 6* 01/05/2015 0610   CBC    Component Value Date/Time   WBC 5.8 01/05/2015 0610   RBC 3.32* 01/05/2015 0610   HGB 9.7* 01/05/2015 0610   HCT 30.9* 01/05/2015 0610   PLT 216 01/05/2015 0610   MCV 93.1 01/05/2015 0610   MCH 29.2 01/05/2015 0610   MCHC 31.4 01/05/2015 0610   RDW 14.4 01/05/2015 0610   LYMPHSABS 1.6 01/01/2015 1723   MONOABS 0.7 01/01/2015 1723   EOSABS 1.5* 01/01/2015 1723   BASOSABS 0.0 01/01/2015 1723     Assessment/Plan: 1. Progressive CKD vs AKI/CKD- given the duration of her poorly controlled DM and HTN, this is most consistent with progression to ESRD, however renal US was ordered to r/o hydro and evaluate size and number of kidneys. Will also order other serologies and urine studies, however given her history and presence of severe anemia, will treat as if she has progressed to ESRD.  1. s/p vein L BC AVF and RIJ TDC 01/03/15 by Dr. Scot Dock  2. Continue to educate patient and husband about renal replacement therapies but will start with incenter HD.  3. We did discuss home therapies but she will need to have permanent access placed and display better compliance with follow up and medications.  4. Will restart ARB due to  persistent HTN and need to decrease bystolic dose. 5. First session of HD 01/03/15, 01/04/15 and will plan for daily HD for 3rd today 6. Await outpatient placement for IHD 2. Anemia of chronic disease- low iron stores and SPEP/UPEP pending.  1. Will initiate ESA therapy with HD as well as IV Iron and follow 3. CKD-MBD- will check calcium, phos, and iPTH and initiate vitamin D therapy and phosphate binders as warrented 4. Hyperkalemia- due to #1. Will treat with IV lasix for now to improve K levels until she can have HD access placed. 5. Malignant HTN- had episode of bradycardia after HD [id number] and bystolic dose decreased. 1. Continue with fluid removal with HD 2. resume benicar and consider adding amlodipine as she is now on HD and edema should not be an issue. 6. Vascular access- s/p L BC AVF and RIJ TDC 01/03/15 by Dr. Scot Dock 7. Disposition- will need to have outpatient HD arranged. Discussed 3rd shift as she is still working as well as nocturnal HD and she is interested in both. CLIP process started yesterday.  [place] [facility]

## 2015-01-05 NOTE — Procedures (Signed)
Patient was seen on dialysis and the procedure was supervised. BFR 300 Via RIJ TDC BP is 169/92.  Patient appears to be tolerating treatment well at this time but did have a drop in BP to 103/67 and became symptomatic with SOB and dizzy.  Adjusted goal UF and will follow.

## 2015-01-05 NOTE — Progress Notes (Signed)
[facility] PROGRESS NOTE  Brenda Davis:[id number] DOB: 03-Jan-1966 DOA: 01/01/2015 PCP: Scarlette Calico, MD  Assessment/Plan: #1 progressive chronic kidney disease stage V versus acute on chronic kidney disease probably requiring hemodialysis Patient with complaints worrisome for progressive chronic kidney disease and on admission noted to have a creatinine of 13.88. Creatinine at 8.26 today. Renal ultrasound negative for hydronephrosis. Patient has been seen in consultation by nephrology who feel patient likely has progressive chronic kidney disease, and Brenda require hemodialysis. Serologies a urine studies have been ordered per nephrology. Vein mapping has been obtained on 01/02/2015 and vascular surgery has been consulted for placement of HD cath as well as aVF/AVG. ARB on hold. Patient has been placed on IV Lasix per nephrology. Patient s/p HD cath placement as well as fistula placement per vascular surgery. Patient started hemodialysis with significant clinical improvement. Per nephrology.   #2 sinus pause/bradycardia  noted during hemodialysis 01/04/2015. Patient was asymptomatic. Patient's beta blocker dose will be decreased by half per cardiology. Patient is on  Norvasc which should not lead to significant bradycardia. 2-D echo was obtained during this hospitalization with a EF of 65-70% with grade 2 diastolic dysfunction, mild mitral valvular regurgitation. TSH within normal limits at 2.638. Appreciate cardiology input and recommendations.  #3 anemia of chronic kidney disease Labs pending. Iron studies as well as SPEP/UPEP has been ordered per nephrology. Transfusion threshold hemoglobin less than 7.  #4 hypertension Stable. Continue bystolic, Lasix, Norvasc.  #5 hyperkalemia Secondary to problem #1.  Resolved. Patient has been started on IV diuretics and hemodialysis per nephrology.   #6 diabetes mellitus II Hemoglobin A1c is 5.2 on  01/03/2015. Currently diet-controlled.  CBGs 64 - 101. D/C sliding scale insulin.  #7 prophylaxis Heparin for DVT prophylaxis.  Code Status: Full Family Communication: Updated patient and family at bedside. Disposition Plan: Per nephrology.   Consultants:  Nephrology: Dr. Marval Regal 01/02/2015  Vascular surgery: Dr. Kellie Simmering 01/02/2015   cardiology : Dr Irish Lack 01/04/2015  Procedures:  Vein mapping 01/02/2015  Renal ultrasound 01/02/2015 1. Ultrasound-guided placement of 23 cm tunneled dialysis catheter--- per Dr. Scot Dock 01/03/2015 2. Left brachial cephalic AV fistula--- per Dr. Scot Dock 01/03/2015 2 units PRBCs. 01/03/2015  Antibiotics:  None  HPI/Subjective: Patient states she's feeling much better post dialysis. Patient stated that during dialysis felt a little bit dizzy however that improved. Patient was noted at that time to have systolic blood pressure in the low 100s.  Objective: Filed Vitals:   01/05/15 1300 01/05/15 1348  BP: 159/90 155/82  Pulse:  65  Temp:  98.2 F (36.8 C)  Resp: 15 16    Intake/Output Summary (Last 24 hours) at 01/05/15 1526 Last data filed at 01/05/15 1348  Gross per 24 hour  Intake    240 ml  Output   5604 ml  Net  -5364 ml   Filed Weights   01/04/15 1647 01/05/15 1038 01/05/15 1348  Weight: 125.8 kg (277 lb 5.4 oz) 124.4 kg (274 lb 4 oz) 123 kg (271 lb 2.7 oz)    Exam:   General:  NAD  Cardiovascular: RRR  Respiratory: CTAB  Abdomen: Obese, soft, nondistended, nontender, positive bowel sounds.  Musculoskeletal: No clubbing or cyanosis. trace bilateral lower extremity edema.  Data Reviewed: Basic Metabolic Panel:  Recent Labs Lab 01/03/15 0402 01/03/15 1650 01/04/15 0745 01/04/15 1410 01/05/15 0610  NA 143 143 142 141 139  K 4.8 4.8 4.3 4.0 3.6  CL 111 111 106 104 98*  CO2 19* 17* 24  23 28  GLUCOSE 78 79 81 76 72  BUN 103* 104* 66* 67* 38*  CREATININE 14.40* 14.51* 10.63* 10.86* 8.26*  CALCIUM 7.3* 7.7* 7.9* 8.0* 8.2*  MG  --   --   --    --  1.9  PHOS 10.6* 10.7* 7.7* 7.8* 6.1*   Liver Function Tests:  Recent Labs Lab 12/31/14 1510 12/31/14 2004 01/03/15 0402 01/03/15 1650 01/04/15 0745 01/04/15 1410 01/05/15 0610  AST 10 11*  --   --   --   --   --   ALT 13 16  --   --   --   --   --   ALKPHOS 79 75  --   --   --   --   --   BILITOT 0.3 0.6  --   --   --   --   --   PROT 6.4 6.8  --   --   --   --   --   ALBUMIN 3.4* 3.4* 2.5* 2.8* 2.6* 2.5* 2.4*    Recent Labs Lab 12/31/14 2004  LIPASE 36   No results for input(s): AMMONIA in the last 168 hours. CBC:  Recent Labs Lab 12/31/14 1510  01/01/15 1723  01/03/15 0402 01/03/15 1650 01/04/15 0745 01/04/15 1410 01/05/15 0610  WBC 6.0  < > 6.1  < > 4.7 5.7 4.7 4.7 5.8  NEUTROABS 2.7  --  2.4  --   --   --   --   --   --   HGB 8.1 Repeated and verified X2.*  < > 7.9*  < > 7.2* 6.8* 9.3* 8.9* 9.7*  HCT 24.9 Repeated and verified X2.*  < > 24.2*  < > 22.5* 21.7* 27.9* 27.5* 30.9*  MCV 93.6  < > 93.8  < > 94.1 94.8 92.7 92.0 93.1  PLT 307.0  < > 298  < > 276 282 215 236 216  < > = values in this interval not displayed. Cardiac Enzymes:  Recent Labs Lab 12/31/14 1510 01/02/15 2142 01/03/15 0402 01/03/15 1650  CKTOTAL 146  --   --   --   CKMB 3.1  --   --   --   TROPONINI  --  <0.03 <0.03 0.04*   BNP (last 3 results) No results for input(s): BNP in the last 8760 hours.  ProBNP (last 3 results)  Recent Labs  12/31/14 1510  PROBNP 477.0*    CBG:  Recent Labs Lab 01/04/15 0729 01/04/15 1130 01/04/15 1801 01/04/15 2121 01/05/15 0726  GLUCAP 78 74 75 70 64*    Recent Results (from the past 240 hour(s))  Surgical pcr screen     Status: None   Collection Time: 01/03/15  8:01 AM  Result Value Ref Range Status   MRSA, PCR NEGATIVE NEGATIVE Final   Staphylococcus aureus NEGATIVE NEGATIVE Final    Comment:        The Xpert SA Assay (FDA approved for NASAL specimens in patients over 75 years of age), is one component of a comprehensive  surveillance program.  Test performance has been validated by [facility] for patients greater than or equal to 51 year old. It is not intended to diagnose infection nor to guide or monitor treatment.      Studies: No results found.  Scheduled Meds: . sodium chloride   Intravenous Once  . acetaminophen  650 mg Oral Once  . amLODipine  10 mg Oral Daily  . calcium acetate  1,334  mg Oral TID WC  . darbepoetin (ARANESP) injection - DIALYSIS  60 mcg Intravenous Q Fri-HD  . diphenhydrAMINE  25 mg Oral Once  . ferric gluconate (FERRLECIT/NULECIT) IV  125 mg Intravenous Q T,Th,Sa-HD  . furosemide  80 mg Intravenous BID  . heparin  5,000 Units Subcutaneous 3 times per day  . irbesartan  300 mg Oral QHS  . nebivolol  5 mg Oral BID   Continuous Infusions:    Principal Problem:   CKD (chronic kidney disease), stage V [facility]): Progressive Active Problems:   HYPERTENSION, BENIGN ESSENTIAL   CKD (chronic kidney disease) stage 5, GFR less than 15 ml/min (HCC)   Kidney failure   DM2 (diabetes mellitus, type 2) (HCC)   Chest pain   S/P dialysis catheter insertion (HCC)   Type 2 diabetes, HbA1c goal < 7% (HCC)   Sinus pause    Time spent: 35 minutes    THOMPSON,DANIEL M.D. [facility] Pager [phone]. If 7PM-7AM, please contact night-coverage at [email or web address], password [facility] 01/05/2015, 3:26 PM  LOS: 4 days

## 2015-01-06 LAB — CBC
HCT: 31.6 % — ABNORMAL LOW (ref 36.0–46.0)
Hemoglobin: 10.1 g/dL — ABNORMAL LOW (ref 12.0–15.0)
MCH: 30.4 pg (ref 26.0–34.0)
MCHC: 32 g/dL (ref 30.0–36.0)
MCV: 95.2 fL (ref 78.0–100.0)
Platelets: 213 10*3/uL (ref 150–400)
RBC: 3.32 MIL/uL — ABNORMAL LOW (ref 3.87–5.11)
RDW: 14.4 % (ref 11.5–15.5)
WBC: 8 10*3/uL (ref 4.0–10.5)

## 2015-01-06 LAB — GLUCOSE, CAPILLARY
Glucose-Capillary: 64 mg/dL — ABNORMAL LOW (ref 65–99)
Glucose-Capillary: 72 mg/dL (ref 65–99)
Glucose-Capillary: 73 mg/dL (ref 65–99)
Glucose-Capillary: 111 mg/dL — ABNORMAL HIGH (ref 65–99)

## 2015-01-06 LAB — RENAL FUNCTION PANEL
Albumin: 2.4 g/dL — ABNORMAL LOW (ref 3.5–5.0)
Anion gap: 12 (ref 5–15)
BUN: 23 mg/dL — ABNORMAL HIGH (ref 6–20)
CO2: 27 mmol/L (ref 22–32)
Calcium: 8.5 mg/dL — ABNORMAL LOW (ref 8.9–10.3)
Chloride: 100 mmol/L — ABNORMAL LOW (ref 101–111)
Creatinine, Ser: 6.94 mg/dL — ABNORMAL HIGH (ref 0.44–1.00)
GFR calc Af Amer: 7 mL/min — ABNORMAL LOW (ref >60)
GFR calc non Af Amer: 6 mL/min — ABNORMAL LOW (ref >60)
Glucose, Bld: 73 mg/dL (ref 65–99)
Phosphorus: 5.3 mg/dL — ABNORMAL HIGH (ref 2.5–4.6)
Potassium: 3.7 mmol/L (ref 3.5–5.1)
Sodium: 139 mmol/L (ref 135–145)

## 2015-01-06 NOTE — Progress Notes (Signed)
[facility] PROGRESS NOTE  Brenda Davis MWU:[id number] DOB: 07-16-1965 DOA: 01/01/2015 PCP: Scarlette Calico, MD  Assessment/Plan: #1 progressive chronic kidney disease stage V versus acute on chronic kidney disease probably requiring hemodialysis Patient with complaints worrisome for progressive chronic kidney disease and on admission noted to have a creatinine of 13.88. Creatinine at 6.94 today. Renal ultrasound negative for hydronephrosis. Patient has been seen in consultation by nephrology who feel patient likely has progressive chronic kidney disease, and may require hemodialysis. Serologies a urine studies have been ordered per nephrology. Vein mapping has been obtained on 01/02/2015 and vascular surgery has been consulted for placement of HD cath as well as aVF/AVG. ARB on hold. Patient has been placed on IV Lasix per nephrology. Patient s/p HD cath placement as well as fistula placement per vascular surgery. Patient started hemodialysis with significant clinical improvement. Per nephrology.   #2 sinus pause/bradycardia  noted during hemodialysis 01/04/2015. Patient was asymptomatic. Patient's beta blocker dose has been decreased by half per cardiology. Patient is on  Norvasc which should not lead to significant bradycardia. 2-D echo was obtained during this hospitalization with a EF of 65-70% with grade 2 diastolic dysfunction, mild mitral valvular regurgitation. TSH within normal limits at 2.638. Appreciate cardiology input and recommendations.  #3 anemia of chronic kidney disease Labs pending. Iron studies as well as SPEP/UPEP has been ordered per nephrology. Transfusion threshold hemoglobin less than 7.  #4 hypertension Stable. Continue bystolic, Lasix, Norvasc.  #5 hyperkalemia Secondary to problem #1.  Resolved. Patient has been started on IV diuretics and hemodialysis per nephrology.   #6 diabetes mellitus II Hemoglobin A1c is 5.2 on  01/03/2015. Currently diet-controlled.  CBGs 64 - 73. D/C sliding scale insulin.  #7 prophylaxis Heparin for DVT prophylaxis.  Code Status: Full Family Communication: Updated patient and family at bedside. Disposition Plan: Per nephrology.   Consultants:  Nephrology: Dr. Marval Regal 01/02/2015  Vascular surgery: Dr. Kellie Simmering 01/02/2015   cardiology : Dr Irish Lack 01/04/2015  Procedures:  Vein mapping 01/02/2015  Renal ultrasound 01/02/2015 1. Ultrasound-guided placement of 23 cm tunneled dialysis catheter--- per Dr. Scot Dock 01/03/2015 2. Left brachial cephalic AV fistula--- per Dr. Scot Dock 01/03/2015 2 units PRBCs. 01/03/2015  Antibiotics:  None  HPI/Subjective: Patient states she's feeling much better post dialysis. Patient denies shortness of breath. No chest pain. Patient asking when she will be able to go home.  Objective: Filed Vitals:   01/06/15 0758 01/06/15 1648  BP: 152/63 132/73  Pulse: 59 65  Temp: 99.4 F (37.4 C) 98.4 F (36.9 C)  Resp: 18 18    Intake/Output Summary (Last 24 hours) at 01/06/15 1754 Last data filed at 01/06/15 1327  Gross per 24 hour  Intake    580 ml  Output   1200 ml  Net   -620 ml   Filed Weights   01/04/15 1647 01/05/15 1038 01/05/15 1348  Weight: 125.8 kg (277 lb 5.4 oz) 124.4 kg (274 lb 4 oz) 123 kg (271 lb 2.7 oz)    Exam:   General:  NAD  Cardiovascular: RRR  Respiratory: CTAB  Abdomen: Obese, soft, nondistended, nontender, positive bowel sounds.  Musculoskeletal: No clubbing or cyanosis, edema.  Data Reviewed: Basic Metabolic Panel:  Recent Labs Lab 01/03/15 1650 01/04/15 0745 01/04/15 1410 01/05/15 0610 01/06/15 0524  NA 143 142 141 139 139  K 4.8 4.3 4.0 3.6 3.7  CL 111 106 104 98* 100*  CO2 17* 24 23 28 27   GLUCOSE 79 81 76 72 73  BUN 104* 66* 67* 38* 23*  CREATININE 14.51* 10.63* 10.86* 8.26* 6.94*  CALCIUM 7.7* 7.9* 8.0* 8.2* 8.5*  MG  --   --   --  1.9  --   PHOS 10.7* 7.7* 7.8* 6.1* 5.3*   Liver Function Tests:  Recent  Labs Lab 12/31/14 1510 12/31/14 2004  01/03/15 1650 01/04/15 0745 01/04/15 1410 01/05/15 0610 01/06/15 0524  AST 10 11*  --   --   --   --   --   --   ALT 13 16  --   --   --   --   --   --   ALKPHOS 79 75  --   --   --   --   --   --   BILITOT 0.3 0.6  --   --   --   --   --   --   PROT 6.4 6.8  --   --   --   --   --   --   ALBUMIN 3.4* 3.4*  < > 2.8* 2.6* 2.5* 2.4* 2.4*  < > = values in this interval not displayed.  Recent Labs Lab 12/31/14 2004  LIPASE 36   No results for input(s): AMMONIA in the last 168 hours. CBC:  Recent Labs Lab 12/31/14 1510  01/01/15 1723  01/03/15 1650 01/04/15 0745 01/04/15 1410 01/05/15 0610 01/06/15 0524  WBC 6.0  < > 6.1  < > 5.7 4.7 4.7 5.8 8.0  NEUTROABS 2.7  --  2.4  --   --   --   --   --   --   HGB 8.1 Repeated and verified X2.*  < > 7.9*  < > 6.8* 9.3* 8.9* 9.7* 10.1*  HCT 24.9 Repeated and verified X2.*  < > 24.2*  < > 21.7* 27.9* 27.5* 30.9* 31.6*  MCV 93.6  < > 93.8  < > 94.8 92.7 92.0 93.1 95.2  PLT 307.0  < > 298  < > 282 215 236 216 213  < > = values in this interval not displayed. Cardiac Enzymes:  Recent Labs Lab 12/31/14 1510 01/02/15 2142 01/03/15 0402 01/03/15 1650  CKTOTAL 146  --   --   --   CKMB 3.1  --   --   --   TROPONINI  --  <0.03 <0.03 0.04*   BNP (last 3 results) No results for input(s): BNP in the last 8760 hours.  ProBNP (last 3 results)  Recent Labs  12/31/14 1510  PROBNP 477.0*    CBG:  Recent Labs Lab 01/05/15 1639 01/05/15 2208 01/06/15 0756 01/06/15 1137 01/06/15 1647  GLUCAP 101* 64* 64* 72 73    Recent Results (from the past 240 hour(s))  Surgical pcr screen     Status: None   Collection Time: 01/03/15  8:01 AM  Result Value Ref Range Status   MRSA, PCR NEGATIVE NEGATIVE Final   Staphylococcus aureus NEGATIVE NEGATIVE Final    Comment:        The Xpert SA Assay (FDA approved for NASAL specimens in patients over 65 years of age), is one component of a  comprehensive surveillance program.  Test performance has been validated by [facility] for patients greater than or equal to 17 year old. It is not intended to diagnose infection nor to guide or monitor treatment.      Studies: No results found.  Scheduled Meds: . sodium chloride   Intravenous Once  . acetaminophen  650  mg Oral Once  . amLODipine  10 mg Oral Daily  . calcium acetate  1,334 mg Oral TID WC  . darbepoetin (ARANESP) injection - DIALYSIS  60 mcg Intravenous Q Fri-HD  . diphenhydrAMINE  25 mg Oral Once  . ferric gluconate (FERRLECIT/NULECIT) IV  125 mg Intravenous Q T,Th,Sa-HD  . furosemide  80 mg Intravenous BID  . heparin  5,000 Units Subcutaneous 3 times per day  . irbesartan  300 mg Oral QHS  . nebivolol  5 mg Oral BID   Continuous Infusions:    Principal Problem:   CKD (chronic kidney disease), stage V [facility]): Progressive Active Problems:   HYPERTENSION, BENIGN ESSENTIAL   CKD (chronic kidney disease) stage 5, GFR less than 15 ml/min (HCC)   Kidney failure   DM2 (diabetes mellitus, type 2) (HCC)   Chest pain   S/P dialysis catheter insertion (HCC)   Type 2 diabetes, HbA1c goal < 7% (HCC)   Sinus pause    Time spent: 70 minutes    [place] Festa M.D. [facility] Pager [phone]. If 7PM-7AM, please contact night-coverage at [email or web address], password [facility] 01/06/2015, 5:54 PM  LOS: 5 days

## 2015-01-06 NOTE — Progress Notes (Signed)
Patient ID: MAXIME NEEF, female   DOB: 26-Mar-1965, 49 y.o.   MRN: [id number] S:tolerated HD well yesterday O:BP 152/63 mmHg  Pulse 59  Temp(Src) 99.4 F (37.4 C) (Oral)  Resp 18  Ht 5\' 5"  (1.651 m)  Wt 123 kg (271 lb 2.7 oz)  BMI 45.12 kg/m2  SpO2 97%  Intake/Output Summary (Last 24 hours) at 01/06/15 0845 Last data filed at 01/06/15 0558  Gross per 24 hour  Intake    480 ml  Output   3204 ml  Net  -2724 ml   Intake/Output: I/O last 3 completed shifts: In: 61 [P.O.:480] Out: 4804 [Urine:2800; Other:2004]  Intake/Output this shift:    Weight change: -3.4 kg (-7 lb 7.9 oz) Gen:WD obese AAF in NAd CVS:no rub Resp:cta [id number] Ext:no edema, LUE AVF +T/B   Recent Labs Lab 12/31/14 1510 12/31/14 2004  01/02/15 1815 01/02/15 2142 01/03/15 0402 01/03/15 1650 01/04/15 0745 01/04/15 1410 01/05/15 0610 01/06/15 0524  NA 143 143  < >  --  143 143 143 142 141 139 139  K 5.1 4.8  < >  --  4.5 4.8 4.8 4.3 4.0 3.6 3.7  CL 107 110  < >  --  111 111 111 106 104 98* 100*  CO2 19 18*  < >  --  17* 19* 17* 24 23 28 27   GLUCOSE 85 95  < >  --  92 78 79 81 76 72 73  BUN 102* 76*  < >  --  104* 103* 104* 66* 67* 38* 23*  CREATININE 13.33* 13.88*  < >  --  14.19* 14.40* 14.51* 10.63* 10.86* 8.26* 6.94*  ALBUMIN 3.4* 3.4*  --   --   --  2.5* 2.8* 2.6* 2.5* 2.4* 2.4*  CALCIUM 7.4* 7.4*  < >  --  7.1* 7.3* 7.7* 7.9* 8.0* 8.2* 8.5*  PHOS  --   --   --  10.1*  --  10.6* 10.7* 7.7* 7.8* 6.1* 5.3*  AST 10 11*  --   --   --   --   --   --   --   --   --   ALT 13 16  --   --   --   --   --   --   --   --   --   < > = values in this interval not displayed. Liver Function Tests:  Recent Labs Lab 12/31/14 1510 12/31/14 2004  01/04/15 1410 01/05/15 0610 01/06/15 0524  AST 10 11*  --   --   --   --   ALT 13 16  --   --   --   --   ALKPHOS 79 75  --   --   --   --   BILITOT 0.3 0.6  --   --   --   --   PROT 6.4 6.8  --   --   --   --   ALBUMIN 3.4* 3.4*  < > 2.5* 2.4* 2.4*  <  > = values in this interval not displayed.  Recent Labs Lab 12/31/14 2004  LIPASE 36   No results for input(s): AMMONIA in the last 168 hours. CBC:  Recent Labs Lab 12/31/14 1510  01/01/15 1723  01/03/15 1650 01/04/15 0745 01/04/15 1410 01/05/15 0610 01/06/15 0524  WBC 6.0  < > 6.1  < > 5.7 4.7 4.7 5.8 8.0  NEUTROABS 2.7  --  2.4  --   --   --   --   --   --  HGB 8.1 Repeated and verified X2.*  < > 7.9*  < > 6.8* 9.3* 8.9* 9.7* 10.1*  HCT 24.9 Repeated and verified X2.*  < > 24.2*  < > 21.7* 27.9* 27.5* 30.9* 31.6*  MCV 93.6  < > 93.8  < > 94.8 92.7 92.0 93.1 95.2  PLT 307.0  < > 298  < > 282 215 236 216 213  < > = values in this interval not displayed. Cardiac Enzymes:  Recent Labs Lab 12/31/14 1510 01/02/15 2142 01/03/15 0402 01/03/15 1650  CKTOTAL 146  --   --   --   CKMB 3.1  --   --   --   TROPONINI  --  <0.03 <0.03 0.04*   CBG:  Recent Labs Lab 01/04/15 2121 01/05/15 0726 01/05/15 1639 01/05/15 2208 01/06/15 0756  GLUCAP 70 64* 101* 64* 64*    Iron Studies:  Recent Labs  01/03/15 1650  IRON 62  TIBC 290  FERRITIN 286   Studies/Results: No results found. . sodium chloride   Intravenous Once  . acetaminophen  650 mg Oral Once  . amLODipine  10 mg Oral Daily  . calcium acetate  1,334 mg Oral TID WC  . darbepoetin (ARANESP) injection - DIALYSIS  60 mcg Intravenous Q Fri-HD  . diphenhydrAMINE  25 mg Oral Once  . ferric gluconate (FERRLECIT/NULECIT) IV  125 mg Intravenous Q T,Th,Sa-HD  . furosemide  80 mg Intravenous BID  . heparin  5,000 Units Subcutaneous 3 times per day  . irbesartan  300 mg Oral QHS  . nebivolol  5 mg Oral BID    BMET    Component Value Date/Time   NA 139 01/06/2015 0524   K 3.7 01/06/2015 0524   CL 100* 01/06/2015 0524   CO2 27 01/06/2015 0524   GLUCOSE 73 01/06/2015 0524   BUN 23* 01/06/2015 0524   CREATININE 6.94* 01/06/2015 0524   CREATININE 2.28* 03/21/2012 0952   CALCIUM 8.5* 01/06/2015 0524   GFRNONAA 6*  01/06/2015 0524   GFRAA 7* 01/06/2015 0524   CBC    Component Value Date/Time   WBC 8.0 01/06/2015 0524   RBC 3.32* 01/06/2015 0524   HGB 10.1* 01/06/2015 0524   HCT 31.6* 01/06/2015 0524   PLT 213 01/06/2015 0524   MCV 95.2 01/06/2015 0524   MCH 30.4 01/06/2015 0524   MCHC 32.0 01/06/2015 0524   RDW 14.4 01/06/2015 0524   LYMPHSABS 1.6 01/01/2015 1723   MONOABS 0.7 01/01/2015 1723   EOSABS 1.5* 01/01/2015 1723   BASOSABS 0.0 01/01/2015 1723     Assessment/Plan: 1. Progressive CKD vs AKI/CKD- given the duration of her poorly controlled DM and HTN, this is most consistent with progression to ESRD, however renal US was ordered to r/o hydro and evaluate size and number of kidneys. Will also order other serologies and urine studies, however given her history and presence of severe anemia, will treat as if she has progressed to ESRD.  1. s/p vein L BC AVF and RIJ TDC 01/03/15 by Dr. Scot Dock  2. Continue to educate patient and husband about renal replacement therapies but will start with incenter HD.  3. We did discuss home therapies but she will need to have permanent access placed and display better compliance with follow up and medications.  4. Will restart ARB due to persistent HTN and need to decrease bystolic dose. 5. First session of HD 01/03/15, 01/04/15 and 01/05/15 plan for HD again tomorrow as she is leaning towards nocturnal dialysis  6. Await outpatient placement for IHD 2. Anemia of chronic disease- low iron stores and SPEP/UPEP pending.  1. Initiated ESA therapy with HD as well as IV Iron and follow 3. CKD-MBD- will check calcium, phos, and iPTH and initiate vitamin D therapy and phosphate binders as warrented 4. Hyperkalemia- due to #1. Will treat with IV lasix for now to improve K levels until she can have HD access placed. 5. Malignant HTN- had episode of bradycardia after HD [id number] and bystolic dose decreased. 1. Continue with fluid removal with  HD 2. resume benicar and consider adding amlodipine as she is now on HD and edema should not be an issue. 6. Vascular access- s/p L BC AVF and RIJ TDC 01/03/15 by Dr. Scot Dock 7. Disposition- will need to have outpatient HD arranged. Discussed 3rd shift as she is still working as well as nocturnal HD and she is interested in both. CLIP process started yesterday.  Barnett A

## 2015-01-07 ENCOUNTER — Telehealth: Payer: Self-pay | Admitting: Vascular Surgery

## 2015-01-07 LAB — RENAL FUNCTION PANEL
Albumin: 2.6 g/dL — ABNORMAL LOW (ref 3.5–5.0)
Anion gap: 12 (ref 5–15)
BUN: 33 mg/dL — ABNORMAL HIGH (ref 6–20)
CO2: 28 mmol/L (ref 22–32)
Calcium: 8.8 mg/dL — ABNORMAL LOW (ref 8.9–10.3)
Chloride: 97 mmol/L — ABNORMAL LOW (ref 101–111)
Creatinine, Ser: 8.78 mg/dL — ABNORMAL HIGH (ref 0.44–1.00)
GFR calc Af Amer: 5 mL/min — ABNORMAL LOW (ref >60)
GFR calc non Af Amer: 5 mL/min — ABNORMAL LOW (ref >60)
Glucose, Bld: 79 mg/dL (ref 65–99)
Phosphorus: 5.9 mg/dL — ABNORMAL HIGH (ref 2.5–4.6)
Potassium: 3.6 mmol/L (ref 3.5–5.1)
Sodium: 137 mmol/L (ref 135–145)

## 2015-01-07 LAB — CBC
HCT: 35.2 % — ABNORMAL LOW (ref 36.0–46.0)
Hemoglobin: 11.3 g/dL — ABNORMAL LOW (ref 12.0–15.0)
MCH: 30.6 pg (ref 26.0–34.0)
MCHC: 32.1 g/dL (ref 30.0–36.0)
MCV: 95.4 fL (ref 78.0–100.0)
Platelets: 259 10*3/uL (ref 150–400)
RBC: 3.69 MIL/uL — ABNORMAL LOW (ref 3.87–5.11)
RDW: 14.2 % (ref 11.5–15.5)
WBC: 9.7 10*3/uL (ref 4.0–10.5)

## 2015-01-07 LAB — GLUCOSE, CAPILLARY: Glucose-Capillary: 75 mg/dL (ref 65–99)

## 2015-01-07 MED ORDER — HEPARIN SODIUM (PORCINE) 1000 UNIT/ML DIALYSIS
20.0000 [IU]/kg | INTRAMUSCULAR | Status: DC | PRN
  Administered 2015-01-07: 2500 [IU] via INTRAVENOUS_CENTRAL

## 2015-01-07 MED ORDER — RENA-VITE PO TABS: 1.0000 | ORAL_TABLET | Freq: Every day | ORAL | Status: DC

## 2015-01-07 MED ORDER — NEBIVOLOL HCL 5 MG PO TABS: 5.0000 mg | ORAL_TABLET | Freq: Two times a day (BID) | ORAL | Status: DC

## 2015-01-07 MED ORDER — IRBESARTAN 300 MG PO TABS: 300.0000 mg | ORAL_TABLET | Freq: Every day | ORAL | Status: DC

## 2015-01-07 MED ORDER — CALCIUM ACETATE (PHOS BINDER) 667 MG PO CAPS: 1334.0000 mg | ORAL_CAPSULE | Freq: Three times a day (TID) | ORAL | Status: DC

## 2015-01-07 MED ORDER — AMLODIPINE BESYLATE 10 MG PO TABS: 10.0000 mg | ORAL_TABLET | Freq: Every day | ORAL | Status: DC

## 2015-01-07 MED ORDER — HYDROXYZINE HCL 25 MG PO TABS: 25.0000 mg | ORAL_TABLET | Freq: Three times a day (TID) | ORAL | Status: DC | PRN

## 2015-01-07 NOTE — Clinical Social Work Note (Signed)
At patient's request, CSW visited room to talk with patient.  Ms. Blake Divine had questions about applying for disability and this was discussed. Patient is currently employed at [employer] and plans to contact her employer regarding short-term disability and may also look into SSA disability.  Patient expressed appreciation for CSW answering her questions.   Kharma Sampsel Givens, MSW, LCSW Licensed Clinical Social Worker [place] [phone]

## 2015-01-07 NOTE — Procedures (Signed)
Pt seen on HD.  Ap 220 Vp 150.  BFR 400.  I think she is at [place].  Has spot at [facility] MWF 2nd shift.  Start renavite.  DC iv lasix.  Spoke with Dr Grandville Silos regarding DC today.

## 2015-01-07 NOTE — Progress Notes (Signed)
01/07/2015 10:07 AM Hemodialysis Outpatient Note; this patient has been accepted at the [facility] center on a Monday, Wednesday and Friday 2nd shift schedule. The center can begin treatment on Wednesday December 21st, patient will need to arrive at 10:30 AM to sing paperwork and consents. Thank you. Brenda Davis

## 2015-01-07 NOTE — Plan of Care (Signed)
Problem: Food- and Nutrition-Related Knowledge Deficit (NB-1.1) Goal: Nutrition education Formal process to instruct or train a patient/client in a skill or to impart knowledge to help patients/clients voluntarily manage or modify food choices and eating behavior to maintain or improve health. Outcome: Completed/Met Date Met:  01/07/15 Nutrition Education Note  RD consulted for Renal Education. Provided "Eating Healthy with Kidney Disease" to patient/family. Reviewed food groups and provided written recommended serving sizes specifically determined for patient's current nutritional status.   Explained why diet restrictions are needed and provided lists of foods to limit/avoid that are high potassium, sodium, and phosphorus. Provided specific recommendations on safer alternatives of these foods. Strongly encouraged compliance of this diet.   Discussed importance of protein intake at each meal and snack. Provided examples of how to maximize protein intake throughout the day. Discussed need for fluid restriction with dialysis and renal-friendly beverage options.  Encouraged pt to discuss specific diet questions/concerns with RD at HD outpatient facility. Teach back method used.  Expect good compliance.  Body mass index is 44.76 kg/(m^2). Pt meets criteria for morbid obesity based on current BMI.  Plans for pt to be discharged to day. Labs and medications reviewed. No further nutrition interventions warranted at this time. RD contact information provided. If additional nutrition issues arise, please re-consult RD.  Corrin Parker, MS, RD, LDN Pager # [phone] After hours/ weekend pager # [phone]

## 2015-01-07 NOTE — Telephone Encounter (Addendum)
-----   Message from Denman George, RN sent at 01/04/2015  9:51 AM EST ----- Regarding: needs access duplex and appt. with [facility] in 4-6 weeks    ----- Message -----    From: Gabriel Earing, PA-C    Sent: 01/03/2015   1:22 PM      To: Vvs Charge Pool  S/p left BC AVF 01/03/15.  F/u with Dr. Scot Dock in 4-6 weeks with duplex.  Thanks, Aldona Bar  notified patient of post op on 02-20-15 at 2:00 lab and 2:45 with dr. Scot Dock

## 2015-01-07 NOTE — Discharge Summary (Signed)
Physician Discharge Summary  Brenda Davis MLY:[id number] DOB: 07-22-1965 DOA: 01/01/2015  PCP: Scarlette Calico, MD  Admit date: 01/01/2015 Discharge date: 01/07/2015  Time spent: 65 minutes  Recommendations for Outpatient Follow-up:  1. Patient is to follow-up at [facility] kidney dialysis Center on Wednesday, 01/09/2015 for continued hemodialysis. 2. Follow-up with Scarlette Calico, MD in 1-2 weeks. Follow up patient needs patient blood pressure also need to be reassessed at that time. a basic metabolic profile done to follow-up on electrolytes and renal function. 3.    Discharge Diagnoses:  Principal Problem:   CKD (chronic kidney disease), stage V [facility]): Progressive Active Problems:   HYPERTENSION, BENIGN ESSENTIAL   CKD (chronic kidney disease) stage 5, GFR less than 15 ml/min (HCC)   Kidney failure   DM2 (diabetes mellitus, type 2) (HCC)   Chest pain   S/P dialysis catheter insertion ([place])   Type 2 diabetes, HbA1c goal < 7% (HCC)   Sinus pause   Discharge Condition: satble an improved  Diet recommendation: heart healthy  Filed Weights   01/06/15 2100 01/07/15 0643 01/07/15 1057  Weight: 122.7 kg (270 lb 8.1 oz) 122.1 kg (269 lb 2.9 oz) 122 kg (268 lb 15.4 oz)    History of present illness:  Per Dr Lia Hopping is a 49 y.o. female with h/o DM2 not on meds for this following a gastric bypass procedure, HTN, gout, CKD that appearred to have been stage 4 back in 2014 before she stopped seeing nephrology. Last lab draw was in Feb 2015 and creatinine at that time was 2.9.  Patient was seen by her PCP for first time in 2 years 2 days ago with complaint of peripheral edema, malaise, and nausea x 1 month. He ordered lab work on the patient which came back with creatinine of 13 and BUN > 100.  Patient has been having gout flare ups in recent months.   Hospital Course:  #1 progressive chronic kidney disease stage V versus acute on chronic kidney disease  probably requiring hemodialysis Patient with complaints worrisome for progressive chronic kidney disease and on admission noted to have a creatinine of 13.88. Creatinine at 8.78 today. Renal ultrasound negative for hydronephrosis. Patient has been seen in consultation by nephrology who feel patient likely has progressive chronic kidney disease, and may require hemodialysis. Serologies and urine studies have been ordered per nephrology. Vein mapping has been obtained on 01/02/2015 and vascular surgery was consulted for placement of HD cath as well as aVF/AVG. ARB on hold. Patient was initially placed on IV Lasix per nephrology. Patient s/p HD cath placement as well as fistula placement per vascular surgery. Patient started hemodialysis with significant clinical improvement. Patient will be discharged home in stable and improved condition to follow-up with nephrology as outpatient.   #2 sinus pause/bradycardia noted during hemodialysis 01/04/2015. Patient was asymptomatic. Cardiology was consulted and assessed the patient. Patient's beta blocker dose has been decreased by half per cardiology. Patient is on Norvasc which should not lead to significant bradycardia. 2-D echo was obtained during this hospitalization with a EF of 65-70% with grade 2 diastolic dysfunction, mild mitral valvular regurgitation. TSH within normal limits at 2.638. Outpatient follow-up.  #3 anemia of chronic kidney disease Labs pending. Iron studies as well as SPEP/UPEP has been ordered per nephrology. Transfusion threshold hemoglobin less than 7. Patient's hemoglobin stabilized. Outpatient follow-up.  #4 hypertension Stable. Patient was maintained on bystolic, Lasix, Norvasc, avapro.  #5 hyperkalemia Secondary to problem #1. Resolved. Patient has  been started on IV diuretics and hemodialysis per nephrology.   #6 diabetes mellitus II Hemoglobin A1c was 5.2 on 01/03/2015. Currently diet-controlled. CBGs 64 - 73.     Procedures:  Vein mapping 01/02/2015  Renal ultrasound 01/02/2015 1. Ultrasound-guided placement of 23 cm tunneled dialysis catheter--- per Dr. Scot Dock 01/03/2015 2. Left brachial cephalic AV fistula--- per Dr. Scot Dock 01/03/2015 2 units PRBCs. 01/03/2015  Consultations:  Nephrology: Dr. Marval Regal 01/02/2015  Vascular surgery: Dr. Kellie Simmering 01/02/2015  cardiology : Dr Irish Lack 01/04/2015    Discharge Exam: Filed Vitals:   01/07/15 1057 01/07/15 1111  BP: 120/57 120/69  Pulse: 57 64  Temp: 98.2 F (36.8 C) 98.4 F (36.9 C)  Resp: 20 20    General: NAD Cardiovascular: RRR Respiratory: CTAB  Discharge Instructions   Discharge Instructions    Diet Carb Modified    Complete by:  As directed      Discharge instructions    Complete by:  As directed   Follow up at [facility] kidney dialysis Center on Monday Wednesdays and Fridays at second shift for hemodialysis. Follow-up with Scarlette Calico, MD in 1-2 weeks.     Increase activity slowly    Complete by:  As directed           Current Discharge Medication List    START taking these medications   Details  amLODipine (NORVASC) 10 MG tablet Take 1 tablet (10 mg total) by mouth daily. Qty: 30 tablet, Refills: 0    calcium acetate (PHOSLO) 667 MG capsule Take 2 capsules (1,334 mg total) by mouth 3 (three) times daily with meals. Qty: 180 capsule, Refills: 0    hydrOXYzine (ATARAX/VISTARIL) 25 MG tablet Take 1 tablet (25 mg total) by mouth 3 (three) times daily as needed for itching. Qty: 30 tablet, Refills: 0    irbesartan (AVAPRO) 300 MG tablet Take 1 tablet (300 mg total) by mouth at bedtime. Qty: 30 tablet, Refills: 0    multivitamin (RENA-VIT) TABS tablet Take 1 tablet by mouth at bedtime. Qty: 30 tablet, Refills: 0      CONTINUE these medications which have CHANGED   Details  nebivolol (BYSTOLIC) 5 MG tablet Take 1 tablet (5 mg total) by mouth 2 (two) times daily. Qty: 60 tablet, Refills: 1       CONTINUE these medications which have NOT CHANGED   Details  allopurinol (ZYLOPRIM) 100 MG tablet Take 1 tablet (100 mg total) by mouth daily. Qty: 90 tablet, Refills: 3      STOP taking these medications     Aspirin-Acetaminophen-Caffeine (GOODY HEADACHE PO)      losartan-hydrochlorothiazide (HYZAAR) 100-25 MG tablet        Allergies  Allergen Reactions  . Amlodipine     edema  . Clonidine Derivatives Swelling  . Welchol [Colesevelam Hcl] Nausea Only   Follow-up Information    Follow up with Deitra Mayo, MD In 4 weeks.   Specialties:  Vascular Surgery, Cardiology   Why:  Office will call you to arrange your appt (sent)   Contact information:   [place] [address] [phone]       Follow up with Scarlette Calico, MD. Schedule an appointment as soon as possible for a visit in 2 weeks.   Specialty:  Internal Medicine   Why:  f/u in 1-2 weeks   Contact information:   [address] [address] [phone]       Follow up On 01/09/2015.   Why:  Follow-up as [facility] dialysis [facility]       The results of significant diagnostics from this hospitalization (including imaging, microbiology, ancillary and laboratory) are listed below for reference.    Significant Diagnostic Studies: US Renal  01/02/2015  CLINICAL DATA:  Acute renal failure EXAM: RENAL / URINARY TRACT ULTRASOUND COMPLETE COMPARISON:  CT abdomen pelvis dated 09/02/2012 FINDINGS: Right Kidney: Length: 10.0 cm. Echogenic renal parenchyma. No mass or hydronephrosis. Left Kidney: Length: 10.1 cm. Echogenic renal parenchyma. No mass or hydronephrosis. Bladder: Mildly thick-walled although underdistended. IMPRESSION: Echogenic renal parenchyma, suggesting medical renal disease. No hydronephrosis. Electronically Signed   By: Julian Hy M.D.   On: 01/02/2015 14:09   Dg Chest Port 1 View  01/03/2015  CLINICAL DATA:  Dialysis catheter placement  EXAM: PORTABLE CHEST 1 VIEW COMPARISON:  Chest radiograph from one day prior. FINDINGS: Right internal jugular central venous catheter terminates in the right atrium. Stable cardiomediastinal silhouette with mild cardiomegaly. No pneumothorax. No pleural effusion. Borderline mild pulmonary edema. No focal lung consolidation. IMPRESSION: 1. Right internal jugular central venous catheter terminates in the right atrium. No pneumothorax. 2. Stable mild cardiomegaly.  Borderline mild pulmonary edema. Electronically Signed   By: Ilona Sorrel M.D.   On: 01/03/2015 13:51   Dg Chest Port 1 View  01/02/2015  CLINICAL DATA:  Chest pressure and shortness of breath tonight. Patient is in kidney failure. EXAM: PORTABLE CHEST 1 VIEW COMPARISON:  07/15/2012 FINDINGS: Shallow inspiration. Cardiac enlargement. Probable vascular crowding along the right heart border. No focal airspace disease or consolidation is demonstrated in the lungs. No blunting of costophrenic angles. No pneumothorax. Mediastinal contours are regular. IMPRESSION: Cardiac enlargement.  No evidence of active pulmonary disease. Electronically Signed   By: Lucienne Capers M.D.   On: 01/02/2015 22:00   Dg Fluoro Guide Cv Line-no Report  01/03/2015  CLINICAL DATA:  FLOURO GUIDE CV LINE Fluoroscopy was utilized by the requesting physician.  No radiographic interpretation.    Microbiology: Recent Results (from the past 240 hour(s))  Surgical pcr screen     Status: None   Collection Time: 01/03/15  8:01 AM  Result Value Ref Range Status   MRSA, PCR NEGATIVE NEGATIVE Final   Staphylococcus aureus NEGATIVE NEGATIVE Final    Comment:        The Xpert SA Assay (FDA approved for NASAL specimens in patients over 22 years of age), is one component of a comprehensive surveillance program.  Test performance has been validated by [facility] for patients greater than or equal to 2 year old. It is not intended to diagnose infection nor to guide or  monitor treatment.      Labs: Basic Metabolic Panel:  Recent Labs Lab 01/04/15 0745 01/04/15 1410 01/05/15 0610 01/06/15 0524 01/07/15 0528  NA 142 141 139 139 137  K 4.3 4.0 3.6 3.7 3.6  CL 106 104 98* 100* 97*  CO2 24 23 28 27 28   GLUCOSE 81 76 72 73 79  BUN 66* 67* 38* 23* 33*  CREATININE 10.63* 10.86* 8.26* 6.94* 8.78*  CALCIUM 7.9* 8.0* 8.2* 8.5* 8.8*  MG  --   --  1.9  --   --   PHOS 7.7* 7.8* 6.1* 5.3* 5.9*   Liver Function Tests:  Recent Labs Lab 12/31/14 1510 12/31/14 2004  01/04/15 0745 01/04/15 1410 01/05/15 0610 01/06/15 0524 01/07/15 0528  AST 10 11*  --   --   --   --   --   --  ALT 13 16  --   --   --   --   --   --   ALKPHOS 79 75  --   --   --   --   --   --   BILITOT 0.3 0.6  --   --   --   --   --   --   PROT 6.4 6.8  --   --   --   --   --   --   ALBUMIN 3.4* 3.4*  < > 2.6* 2.5* 2.4* 2.4* 2.6*  < > = values in this interval not displayed.  Recent Labs Lab 12/31/14 2004  LIPASE 36   No results for input(s): AMMONIA in the last 168 hours. CBC:  Recent Labs Lab 12/31/14 1510  01/01/15 1723  01/04/15 0745 01/04/15 1410 01/05/15 0610 01/06/15 0524 01/07/15 0702  WBC 6.0  < > 6.1  < > 4.7 4.7 5.8 8.0 9.7  NEUTROABS 2.7  --  2.4  --   --   --   --   --   --   HGB 8.1 Repeated and verified X2.*  < > 7.9*  < > 9.3* 8.9* 9.7* 10.1* 11.3*  HCT 24.9 Repeated and verified X2.*  < > 24.2*  < > 27.9* 27.5* 30.9* 31.6* 35.2*  MCV 93.6  < > 93.8  < > 92.7 92.0 93.1 95.2 95.4  PLT 307.0  < > 298  < > 215 236 216 213 259  < > = values in this interval not displayed. Cardiac Enzymes:  Recent Labs Lab 12/31/14 1510 01/02/15 2142 01/03/15 0402 01/03/15 1650  CKTOTAL 146  --   --   --   CKMB 3.1  --   --   --   TROPONINI  --  <0.03 <0.03 0.04*   BNP: BNP (last 3 results) No results for input(s): BNP in the last 8760 hours.  ProBNP (last 3 results)  Recent Labs  12/31/14 1510  PROBNP 477.0*    CBG:  Recent Labs Lab  01/05/15 2208 01/06/15 0756 01/06/15 1137 01/06/15 1647 01/06/15 2204  GLUCAP 64* 64* 72 73 111*       Signed:  Legrande Hao MD [facility] 01/07/2015, 11:39 AM

## 2015-01-07 NOTE — Progress Notes (Signed)
Brenda Davis to be D/C'd Home per MD order.  Discussed prescriptions and follow up appointments with the patient. Prescriptions given to patient, medication list explained in detail. Pt verbalized understanding.    Medication List    STOP taking these medications        GOODY HEADACHE PO     losartan-hydrochlorothiazide 100-25 MG tablet  Commonly known as:  HYZAAR      TAKE these medications        allopurinol 100 MG tablet  Commonly known as:  ZYLOPRIM  Take 1 tablet (100 mg total) by mouth daily.     amLODipine 10 MG tablet  Commonly known as:  NORVASC  Take 1 tablet (10 mg total) by mouth daily.     calcium acetate 667 MG capsule  Commonly known as:  PHOSLO  Take 2 capsules (1,334 mg total) by mouth 3 (three) times daily with meals.     hydrOXYzine 25 MG tablet  Commonly known as:  ATARAX/VISTARIL  Take 1 tablet (25 mg total) by mouth 3 (three) times daily as needed for itching.     irbesartan 300 MG tablet  Commonly known as:  AVAPRO  Take 1 tablet (300 mg total) by mouth at bedtime.     multivitamin Tabs tablet  Take 1 tablet by mouth at bedtime.     nebivolol 5 MG tablet  Commonly known as:  BYSTOLIC  Take 1 tablet (5 mg total) by mouth 2 (two) times daily.        Filed Vitals:   01/07/15 1057 01/07/15 1111  BP: 120/57 120/69  Pulse: 57 64  Temp: 98.2 F (36.8 C) 98.4 F (36.9 C)  Resp: 20 20    Skin clean, dry and intact without evidence of skin break down, no evidence of skin tears noted. IV catheter discontinued intact. Site without signs and symptoms of complications. Dressing and pressure applied. Pt denies pain at this time. No complaints noted.  An After Visit Summary was printed and given to the patient. Patient escorted via [place], and D/C home via private auto.  Carole Civil RN [facility] Phone [phone]

## 2015-01-16 ENCOUNTER — Inpatient Hospital Stay: Payer: 59 | Admitting: Internal Medicine

## 2015-01-22 ENCOUNTER — Other Ambulatory Visit: Payer: Self-pay | Admitting: Internal Medicine

## 2015-01-22 ENCOUNTER — Telehealth: Payer: Self-pay | Admitting: Internal Medicine

## 2015-01-22 MED ORDER — METHYLPREDNISOLONE 4 MG PO TBPK: ORAL_TABLET | ORAL | Status: DC

## 2015-01-22 NOTE — Telephone Encounter (Signed)
Please advise 

## 2015-01-22 NOTE — Telephone Encounter (Signed)
Pt called in and her gout if flaring up pretty bad and she is wondering if you can call her in some prednisone to [facility] on [address] today. She feels she needs this to be able to make her appts tomorrow.

## 2015-01-22 NOTE — Telephone Encounter (Signed)
done

## 2015-01-23 ENCOUNTER — Ambulatory Visit (INDEPENDENT_AMBULATORY_CARE_PROVIDER_SITE_OTHER): Payer: 59 | Admitting: Internal Medicine

## 2015-01-23 ENCOUNTER — Ambulatory Visit (INDEPENDENT_AMBULATORY_CARE_PROVIDER_SITE_OTHER)
Admission: RE | Admit: 2015-01-23 | Discharge: 2015-01-23 | Disposition: A | Discharge status: Home / Self Care | Payer: 59 | Source: Ambulatory Visit | Attending: Internal Medicine | Admitting: Internal Medicine

## 2015-01-23 ENCOUNTER — Encounter: Payer: Self-pay | Admitting: Internal Medicine

## 2015-01-23 VITALS — BP 150/80 | HR 85 | Temp 98.6°F | Resp 16 | Ht 65.0 in | Wt 268.0 lb

## 2015-01-23 DIAGNOSIS — Z1211 Encounter for screening for malignant neoplasm of colon: Secondary | ICD-10-CM

## 2015-01-23 DIAGNOSIS — R05 Cough: Secondary | ICD-10-CM

## 2015-01-23 DIAGNOSIS — R059 Cough, unspecified: Secondary | ICD-10-CM | POA: Insufficient documentation

## 2015-01-23 DIAGNOSIS — J069 Acute upper respiratory infection, unspecified: Secondary | ICD-10-CM

## 2015-01-23 DIAGNOSIS — M109 Gout, unspecified: Secondary | ICD-10-CM

## 2015-01-23 DIAGNOSIS — Z1231 Encounter for screening mammogram for malignant neoplasm of breast: Secondary | ICD-10-CM

## 2015-01-23 DIAGNOSIS — B9789 Other viral agents as the cause of diseases classified elsewhere: Secondary | ICD-10-CM

## 2015-01-23 MED ORDER — HYDROCOD POLST-CPM POLST ER 10-8 MG/5ML PO SUER: 5.0000 mL | Freq: Two times a day (BID) | ORAL | Status: DC | PRN

## 2015-01-23 NOTE — Patient Instructions (Signed)

## 2015-01-23 NOTE — Progress Notes (Signed)
Pre visit review using our clinic review tool, if applicable. No additional management support is needed unless otherwise documented below in the visit note. 

## 2015-01-23 NOTE — Progress Notes (Signed)
Subjective:  Patient ID: Brenda Davis, female    DOB: [birth year]  Age: 50 y.o. MRN: [id number]  CC: Hypertension; Cough; and URI   HPI Brenda Davis presents for NP cough for 1 week. She has also had right ankle pain and swelling for a few days, she started a medrol dose pak 1 day PTA and reports the ankle pain and swelling are improving.   Outpatient Prescriptions Prior to Visit  Medication Sig Dispense Refill  . allopurinol (ZYLOPRIM) 100 MG tablet Take 1 tablet (100 mg total) by mouth daily. (Patient taking differently: Take 100 mg by mouth daily as needed (for gout flare ups). ) 90 tablet 3  . amLODipine (NORVASC) 10 MG tablet Take 1 tablet (10 mg total) by mouth daily. 30 tablet 0  . calcium acetate (PHOSLO) 667 MG capsule Take 2 capsules (1,334 mg total) by mouth 3 (three) times daily with meals. 180 capsule 0  . hydrOXYzine (ATARAX/VISTARIL) 25 MG tablet Take 1 tablet (25 mg total) by mouth 3 (three) times daily as needed for itching. 30 tablet 0  . irbesartan (AVAPRO) 300 MG tablet Take 1 tablet (300 mg total) by mouth at bedtime. 30 tablet 0  . methylPREDNISolone (MEDROL DOSEPAK) 4 MG TBPK tablet TAKE AS DIRECTED 21 tablet 0  . multivitamin (RENA-VIT) TABS tablet Take 1 tablet by mouth at bedtime. 30 tablet 0  . nebivolol (BYSTOLIC) 5 MG tablet Take 1 tablet (5 mg total) by mouth 2 (two) times daily. 60 tablet 1   No facility-administered medications prior to visit.    ROS Review of Systems  Constitutional: Negative.  Negative for fever, chills, diaphoresis, appetite change and fatigue.  HENT: Positive for sore throat. Negative for congestion, facial swelling, sinus pressure and trouble swallowing.   Eyes: Negative.   Respiratory: Positive for cough. Negative for apnea, choking, chest tightness, shortness of breath, wheezing and stridor.   Cardiovascular: Negative.  Negative for chest pain, palpitations and leg swelling.  Gastrointestinal: Negative.  Negative for  nausea, vomiting, abdominal pain, diarrhea and constipation.  Endocrine: Negative.   Genitourinary: Negative.   Musculoskeletal: Positive for arthralgias.  Skin: Negative.  Negative for pallor and rash.  Allergic/Immunologic: Negative.   Neurological: Negative.   Hematological: Negative.  Negative for adenopathy. Does not bruise/bleed easily.  Psychiatric/Behavioral: Negative.     Objective:  BP 150/80 mmHg  Pulse 85  Temp(Src) 98.6 F (37 C) (Oral)  Resp 16  Ht 5\' 5"  (1.651 m)  Wt 268 lb (121.564 kg)  BMI 44.60 kg/m2  SpO2 98%  BP Readings from Last 3 Encounters:  01/23/15 150/80  01/07/15 120/69  12/31/14 145/63    Wt Readings from Last 3 Encounters:  01/23/15 268 lb (121.564 kg)  01/07/15 268 lb 15.4 oz (122 kg)  12/31/14 290 lb (131.543 kg)    Physical Exam  Constitutional: She is oriented to person, place, and time.  Non-toxic appearance. She does not have a sickly appearance. She does not appear ill. No distress.  HENT:  Mouth/Throat: Oropharynx is clear and moist. No oropharyngeal exudate.  Eyes: Conjunctivae are normal. Right eye exhibits no discharge. Left eye exhibits no discharge. No scleral icterus.  Neck: Normal range of motion. Neck supple. No JVD present. No tracheal deviation present. No thyromegaly present.  Cardiovascular: Normal rate, regular rhythm, normal heart sounds and intact distal pulses.  Exam reveals no gallop and no friction rub.   No murmur heard. Pulmonary/Chest: Effort normal and breath sounds normal. No accessory muscle  usage or stridor. No respiratory distress. She has no decreased breath sounds. She has no wheezes. She has no rhonchi. She has no rales. She exhibits no tenderness.  She coughs with inspiration  Abdominal: Soft. Bowel sounds are normal. She exhibits no distension. There is no tenderness. There is no rebound and no guarding.  Musculoskeletal: Normal range of motion. She exhibits no edema.       Right ankle: She exhibits  swelling. She exhibits normal range of motion, no ecchymosis, no deformity, no laceration and normal pulse. Tenderness. Lateral malleolus and medial malleolus tenderness found.  Lymphadenopathy:    She has no cervical adenopathy.  Neurological: She is oriented to person, place, and time.  Skin: Skin is warm and dry. No rash noted. She is not diaphoretic. No erythema.    Lab Results  Component Value Date   WBC 9.7 01/07/2015   HGB 11.3* 01/07/2015   HCT 35.2* 01/07/2015   PLT 259 01/07/2015   GLUCOSE 79 01/07/2015   CHOL 189 12/31/2014   TRIG 80.0 12/31/2014   HDL 50.40 12/31/2014   LDLDIRECT 161.6 11/28/2012   LDLCALC 123* 12/31/2014   ALT 16 12/31/2014   AST 11* 12/31/2014   NA 137 01/07/2015   K 3.6 01/07/2015   CL 97* 01/07/2015   CREATININE 8.78* 01/07/2015   BUN 33* 01/07/2015   CO2 28 01/07/2015   TSH 2.638 01/05/2015   INR 1.13 01/03/2015   HGBA1C 5.2 01/03/2015   MICROALBUR 159.00* 09/17/2009    US Renal  01/02/2015  CLINICAL DATA:  Acute renal failure EXAM: RENAL / URINARY TRACT ULTRASOUND COMPLETE COMPARISON:  CT abdomen pelvis dated 09/02/2012 FINDINGS: Right Kidney: Length: 10.0 cm. Echogenic renal parenchyma. No mass or hydronephrosis. Left Kidney: Length: 10.1 cm. Echogenic renal parenchyma. No mass or hydronephrosis. Bladder: Mildly thick-walled although underdistended. IMPRESSION: Echogenic renal parenchyma, suggesting medical renal disease. No hydronephrosis. Electronically Signed   By: Julian Hy M.D.   On: 01/02/2015 14:09   Dg Chest Port 1 View  01/02/2015  CLINICAL DATA:  Chest pressure and shortness of breath tonight. Patient is in kidney failure. EXAM: PORTABLE CHEST 1 VIEW COMPARISON:  07/15/2012 FINDINGS: Shallow inspiration. Cardiac enlargement. Probable vascular crowding along the right heart border. No focal airspace disease or consolidation is demonstrated in the lungs. No blunting of costophrenic angles. No pneumothorax. Mediastinal contours  are regular. IMPRESSION: Cardiac enlargement.  No evidence of active pulmonary disease. Electronically Signed   By: Lucienne Capers M.D.   On: 01/02/2015 22:00    Assessment & Plan:   Stephannie was seen today for hypertension, cough and uri.  Diagnoses and all orders for this visit:  Acute gouty arthritis- improvement noted within Medrol Dosepak  Visit for screening mammogram -     MM DIGITAL SCREENING BILATERAL; Future  Colon cancer screening -     Ambulatory referral to Gastroenterology  Cough- her chest x-rays normal -     DG Chest 2 View; Future  Viral URI with cough- all indications are that this is viral, will offer symptomatic relief with Tussionex suspension as needed -     chlorpheniramine-HYDROcodone (TUSSIONEX PENNKINETIC ER) 10-8 MG/5ML SUER; Take 5 mLs by mouth every 12 (twelve) hours as needed for cough. -     DG Chest 2 View; Future   I am having Ms. Goggans start on chlorpheniramine-HYDROcodone. I am also having her maintain her allopurinol, amLODipine, calcium acetate, hydrOXYzine, irbesartan, multivitamin, nebivolol, and methylPREDNISolone.  Meds ordered this encounter  Medications  .  chlorpheniramine-HYDROcodone (TUSSIONEX PENNKINETIC ER) 10-8 MG/5ML SUER    Sig: Take 5 mLs by mouth every 12 (twelve) hours as needed for cough.    Dispense:  140 mL    Refill:  0     Follow-up: Return in about 3 weeks (around 02/13/2015).  Scarlette Calico, MD

## 2015-01-25 ENCOUNTER — Telehealth: Payer: Self-pay | Admitting: *Deleted

## 2015-01-25 DIAGNOSIS — M109 Gout, unspecified: Secondary | ICD-10-CM

## 2015-01-25 NOTE — Telephone Encounter (Signed)
Notified pt with md response.../lmb 

## 2015-01-25 NOTE — Telephone Encounter (Signed)
Pt left msg on triage stating saw Dr. Ronnald Ramp on Wed for Gout in her foot he gave her prednisone which she is on her 3 dose. Normally when she take prednisone it would help, but still ankle/foot swollen, in a lot of pain wanting to see if there is anything else she can take.MD is out of office pls advise...Johny Chess

## 2015-01-25 NOTE — Telephone Encounter (Signed)
Take Tylenol to help pain prn.  OV if not better Thx

## 2015-01-28 MED ORDER — OXYCODONE HCL 5 MG PO CAPS: 5.0000 mg | ORAL_CAPSULE | ORAL | Status: DC | PRN

## 2015-01-28 NOTE — Addendum Note (Signed)
Addended by: [name] on: 01/28/2015 12:35 PM   Modules accepted: Orders

## 2015-01-28 NOTE — Telephone Encounter (Signed)
Try oxycodone Rx written - she will need to come pick it up

## 2015-01-28 NOTE — Telephone Encounter (Signed)
Patient called to follow up on this. She is advising that she is in unbearable pain and could barely make it to dialysis today. She is asking if there is anything else other than the prednisone that you can call in to help

## 2015-01-28 NOTE — Telephone Encounter (Signed)
Pt was informed and will be picking up prescription. She is wondering what other medication can be taking to get rid of her gout Please inform

## 2015-01-29 ENCOUNTER — Ambulatory Visit (INDEPENDENT_AMBULATORY_CARE_PROVIDER_SITE_OTHER): Payer: 59 | Admitting: Family

## 2015-01-29 ENCOUNTER — Encounter: Payer: Self-pay | Admitting: Family

## 2015-01-29 VITALS — BP 158/84 | HR 77 | Temp 98.3°F | Resp 18 | Ht 65.0 in

## 2015-01-29 DIAGNOSIS — M109 Gout, unspecified: Secondary | ICD-10-CM | POA: Diagnosis not present

## 2015-01-29 MED ORDER — PREDNISONE 5 MG (21) PO TBPK: ORAL_TABLET | ORAL | Status: DC

## 2015-01-29 MED ORDER — COLCHICINE 0.6 MG PO TABS: 0.6000 mg | ORAL_TABLET | Freq: Two times a day (BID) | ORAL | Status: DC

## 2015-01-29 MED ORDER — METHYLPREDNISOLONE ACETATE 80 MG/ML IJ SUSP
80.0000 mg | Freq: Once | INTRAMUSCULAR | Status: AC
Start: 2015-01-29 — End: 2015-01-29
  Administered 2015-01-29: 80 mg via INTRAMUSCULAR

## 2015-01-29 NOTE — Assessment & Plan Note (Signed)
Symptoms and exam consistent with gouty arthritis. In office injection of Depo-Medrol provided. Start prednisone taper if relief is not experience within the next 24-48 hours. Consider colchicine and low-dose per up-to-date. Follow-up if symptoms worsen or fail to improve.

## 2015-01-29 NOTE — Progress Notes (Signed)
Pre visit review using our clinic review tool, if applicable. No additional management support is needed unless otherwise documented below in the visit note. 

## 2015-01-29 NOTE — Telephone Encounter (Signed)
Advise on gout?

## 2015-01-29 NOTE — Telephone Encounter (Signed)
Rx sent 

## 2015-01-29 NOTE — Progress Notes (Signed)
Subjective:    Patient ID: Brenda Davis, female    DOB: Nov 12, 1965, 50 y.o.   MRN: [id number]  Chief Complaint  Patient presents with  . Gout    Right foot is in severe pain from gout states this is the worst bout she has had, x2 weeks, did have prednisone and it worked for 2 days and then got back worse the 3rd day    HPI:  Brenda Davis is a 50 y.o. female who  has a past medical history of Hypertension; Diabetes mellitus; Gout; Depression; Gout; Hyperlipidemia; Blood transfusion without reported diagnosis; Morbid obesity ([place]); Chronic kidney disease; Headache(784.0); and Pain. and presents today for an acute office visit.   Gout - Associated symptom of pain located in her right ankle has been going on for approximately 2 weeks. Modifying factors include prednisone which worked for a short period of time but then the pain came back. The oxycodone she was prescribed has not helped with the situation. She is on diaylsis.  Allergies  Allergen Reactions  . Amlodipine     edema  . Clonidine Derivatives Swelling  . Welchol [Colesevelam Hcl] Nausea Only     Current Outpatient Prescriptions on File Prior to Visit  Medication Sig Dispense Refill  . allopurinol (ZYLOPRIM) 100 MG tablet Take 1 tablet (100 mg total) by mouth daily. (Patient taking differently: Take 100 mg by mouth daily as needed (for gout flare ups). ) 90 tablet 3  . amLODipine (NORVASC) 10 MG tablet Take 1 tablet (10 mg total) by mouth daily. 30 tablet 0  . calcium acetate (PHOSLO) 667 MG capsule Take 2 capsules (1,334 mg total) by mouth 3 (three) times daily with meals. 180 capsule 0  . chlorpheniramine-HYDROcodone (TUSSIONEX PENNKINETIC ER) 10-8 MG/5ML SUER Take 5 mLs by mouth every 12 (twelve) hours as needed for cough. 140 mL 0  . hydrOXYzine (ATARAX/VISTARIL) 25 MG tablet Take 1 tablet (25 mg total) by mouth 3 (three) times daily as needed for itching. 30 tablet 0  . irbesartan (AVAPRO) 300 MG tablet Take 1  tablet (300 mg total) by mouth at bedtime. 30 tablet 0  . methylPREDNISolone (MEDROL DOSEPAK) 4 MG TBPK tablet TAKE AS DIRECTED 21 tablet 0  . multivitamin (RENA-VIT) TABS tablet Take 1 tablet by mouth at bedtime. 30 tablet 0  . nebivolol (BYSTOLIC) 5 MG tablet Take 1 tablet (5 mg total) by mouth 2 (two) times daily. 60 tablet 1  . oxycodone (OXY-IR) 5 MG capsule Take 1 capsule (5 mg total) by mouth every 4 (four) hours as needed. 50 capsule 0  . [DISCONTINUED] colchicine 0.6 MG tablet Take 1 tablet (0.6 mg total) by mouth 2 (two) times daily. 20 tablet 1   No current facility-administered medications on file prior to visit.    Review of Systems  Constitutional: Negative for fever and chills.  Musculoskeletal: Positive for arthralgias.  Neurological: Negative for weakness.      Objective:    BP 158/84 mmHg  Pulse 77  Temp(Src) 98.3 F (36.8 C) (Oral)  Resp 18  Ht 5' 5"  (1.651 m)  Wt   SpO2 99% Nursing note and vital signs reviewed.  Physical Exam  Constitutional: She is oriented to person, place, and time. She appears well-developed and well-nourished. No distress.  Cardiovascular: Normal rate, regular rhythm, normal heart sounds and intact distal pulses.   Pulmonary/Chest: Effort normal and breath sounds normal.  Musculoskeletal:  Right ankle - mild edema with mild redness and no obvious  deformity. Generalized ankle tenderness around ankle mortise and warmth. Range of motion is limited secondary to pain. Ligamentous testing deferred secondary to pain and inflammation. Dorsalis pedis and tibialis posterior pulses intact and appropriate.   Neurological: She is alert and oriented to person, place, and time.  Skin: Skin is warm and dry.  Psychiatric: She has a normal mood and affect. Her behavior is normal. Judgment and thought content normal.       Assessment & Plan:   Problem List Items Addressed This Visit      Other   Acute gouty arthritis - Primary    Symptoms and exam  consistent with gouty arthritis. In office injection of Depo-Medrol provided. Start prednisone taper if relief is not experience within the next 24-48 hours. Consider colchicine and low-dose per up-to-date. Follow-up if symptoms worsen or fail to improve.      Relevant Medications   predniSONE (STERAPRED UNI-PAK 21 TAB) 5 MG (21) TBPK tablet

## 2015-01-29 NOTE — Addendum Note (Signed)
Addended by: [name] on: 01/29/2015 02:59 PM   Modules accepted: Orders

## 2015-01-29 NOTE — Patient Instructions (Signed)
Thank you for choosing [facility].  Summary/Instructions:  Your prescription(s) have been submitted to your pharmacy or been printed and provided for you. Please take as directed and contact our office if you believe you are having problem(s) with the medication(s) or have any questions.  If your symptoms worsen or fail to improve, please contact our office for further instruction, or in case of emergency go directly to the emergency room at the closest medical facility.   Gout Gout is an inflammatory arthritis caused by a buildup of uric acid crystals in the joints. Uric acid is a chemical that is normally present in the blood. When the level of uric acid in the blood is too high it can form crystals that deposit in your joints and tissues. This causes joint redness, soreness, and swelling (inflammation). Repeat attacks are common. Over time, uric acid crystals can form into masses (tophi) near a joint, destroying bone and causing disfigurement. Gout is treatable and often preventable. CAUSES  The disease begins with elevated levels of uric acid in the blood. Uric acid is produced by your body when it breaks down a naturally found substance called purines. Certain foods you eat, such as meats and fish, contain high amounts of purines. Causes of an elevated uric acid level include:  Being passed down from parent to child (heredity).  Diseases that cause increased uric acid production (such as obesity, psoriasis, and certain cancers).  Excessive alcohol use.  Diet, especially diets rich in meat and seafood.  Medicines, including certain cancer-fighting medicines (chemotherapy), water pills (diuretics), and aspirin.  Chronic kidney disease. The kidneys are no longer able to remove uric acid well.  Problems with metabolism. Conditions strongly associated with gout include:  Obesity.  High blood pressure.  High cholesterol.  Diabetes. Not everyone with elevated uric acid levels  gets gout. It is not understood why some people get gout and others do not. Surgery, joint injury, and eating too much of certain foods are some of the factors that can lead to gout attacks. SYMPTOMS   An attack of gout comes on quickly. It causes intense pain with redness, swelling, and warmth in a joint.  Fever can occur.  Often, only one joint is involved. Certain joints are more commonly involved:  Base of the big toe.  Knee.  Ankle.  Wrist.  Finger. Without treatment, an attack usually goes away in a few days to weeks. Between attacks, you usually will not have symptoms, which is different from many other forms of arthritis. DIAGNOSIS  Your caregiver will suspect gout based on your symptoms and exam. In some cases, tests may be recommended. The tests may include:  Blood tests.  Urine tests.  X-rays.  Joint fluid exam. This exam requires a needle to remove fluid from the joint (arthrocentesis). Using a microscope, gout is confirmed when uric acid crystals are seen in the joint fluid. TREATMENT  There are two phases to gout treatment: treating the sudden onset (acute) attack and preventing attacks (prophylaxis).  Treatment of an Acute Attack.  Medicines are used. These include anti-inflammatory medicines or steroid medicines.  An injection of steroid medicine into the affected joint is sometimes necessary.  The painful joint is rested. Movement can worsen the arthritis.  You may use warm or cold treatments on painful joints, depending which works best for you.  Treatment to Prevent Attacks.  If you suffer from frequent gout attacks, your caregiver may advise preventive medicine. These medicines are started after the acute  attack subsides. These medicines either help your kidneys eliminate uric acid from your body or decrease your uric acid production. You may need to stay on these medicines for a very long time.  The early phase of treatment with preventive medicine  can be associated with an increase in acute gout attacks. For this reason, during the first few months of treatment, your caregiver may also advise you to take medicines usually used for acute gout treatment. Be sure you understand your caregiver's directions. Your caregiver may make several adjustments to your medicine dose before these medicines are effective.  Discuss dietary treatment with your caregiver or dietitian. Alcohol and drinks high in sugar and fructose and foods such as meat, poultry, and seafood can increase uric acid levels. Your caregiver or dietitian can advise you on drinks and foods that should be limited. HOME CARE INSTRUCTIONS   Do not take aspirin to relieve pain. This raises uric acid levels.  Only take over-the-counter or prescription medicines for pain, discomfort, or fever as directed by your caregiver.  Rest the joint as much as possible. When in bed, keep sheets and blankets off painful areas.  Keep the affected joint raised (elevated).  Apply warm or cold treatments to painful joints. Use of warm or cold treatments depends on which works best for you.  Use crutches if the painful joint is in your leg.  Drink enough fluids to keep your urine clear or pale yellow. This helps your body get rid of uric acid. Limit alcohol, sugary drinks, and fructose drinks.  Follow your dietary instructions. Pay careful attention to the amount of protein you eat. Your daily diet should emphasize fruits, vegetables, whole grains, and fat-free or low-fat milk products. Discuss the use of coffee, vitamin C, and cherries with your caregiver or dietitian. These may be helpful in lowering uric acid levels.  Maintain a healthy body weight. SEEK MEDICAL CARE IF:   You develop diarrhea, vomiting, or any side effects from medicines.  You do not feel better in 24 hours, or you are getting worse. SEEK IMMEDIATE MEDICAL CARE IF:   Your joint becomes suddenly more tender, and you have  chills or a fever. MAKE SURE YOU:   Understand these instructions.  Will watch your condition.  Will get help right away if you are not doing well or get worse.   This information is not intended to replace advice given to you by your health care provider. Make sure you discuss any questions you have with your health care provider.   Document Released: 01/03/2000 Document Revised: 01/26/2014 Document Reviewed: 08/19/2011 Elsevier Interactive Patient Education [employer].

## 2015-02-05 ENCOUNTER — Emergency Department (HOSPITAL_COMMUNITY): Payer: 59

## 2015-02-05 ENCOUNTER — Emergency Department (HOSPITAL_COMMUNITY)
Admission: EM | Admit: 2015-02-05 | Discharge: 2015-02-05 | Disposition: A | Discharge status: Home / Self Care | Payer: 59 | Attending: Emergency Medicine | Admitting: Emergency Medicine

## 2015-02-05 ENCOUNTER — Encounter (HOSPITAL_COMMUNITY): Payer: Self-pay | Admitting: Emergency Medicine

## 2015-02-05 DIAGNOSIS — M109 Gout, unspecified: Secondary | ICD-10-CM | POA: Insufficient documentation

## 2015-02-05 DIAGNOSIS — Z87891 Personal history of nicotine dependence: Secondary | ICD-10-CM | POA: Insufficient documentation

## 2015-02-05 DIAGNOSIS — R10A0 Flank pain, unspecified side: Secondary | ICD-10-CM

## 2015-02-05 DIAGNOSIS — Z992 Dependence on renal dialysis: Secondary | ICD-10-CM | POA: Insufficient documentation

## 2015-02-05 DIAGNOSIS — K8020 Calculus of gallbladder without cholecystitis without obstruction: Secondary | ICD-10-CM

## 2015-02-05 DIAGNOSIS — I120 Hypertensive chronic kidney disease with stage 5 chronic kidney disease or end stage renal disease: Secondary | ICD-10-CM | POA: Insufficient documentation

## 2015-02-05 DIAGNOSIS — Z79899 Other long term (current) drug therapy: Secondary | ICD-10-CM | POA: Insufficient documentation

## 2015-02-05 DIAGNOSIS — N390 Urinary tract infection, site not specified: Secondary | ICD-10-CM

## 2015-02-05 DIAGNOSIS — K5900 Constipation, unspecified: Secondary | ICD-10-CM | POA: Diagnosis not present

## 2015-02-05 DIAGNOSIS — R109 Unspecified abdominal pain: Secondary | ICD-10-CM | POA: Diagnosis present

## 2015-02-05 DIAGNOSIS — N186 End stage renal disease: Secondary | ICD-10-CM

## 2015-02-05 DIAGNOSIS — E1122 Type 2 diabetes mellitus with diabetic chronic kidney disease: Secondary | ICD-10-CM | POA: Diagnosis not present

## 2015-02-05 DIAGNOSIS — F329 Major depressive disorder, single episode, unspecified: Secondary | ICD-10-CM | POA: Insufficient documentation

## 2015-02-05 LAB — COMPREHENSIVE METABOLIC PANEL
ALT: 16 U/L (ref 14–54)
AST: 15 U/L (ref 15–41)
Albumin: 3 g/dL — ABNORMAL LOW (ref 3.5–5.0)
Alkaline Phosphatase: 56 U/L (ref 38–126)
Anion gap: 13 (ref 5–15)
BUN: 23 mg/dL — ABNORMAL HIGH (ref 6–20)
CO2: 28 mmol/L (ref 22–32)
Calcium: 9 mg/dL (ref 8.9–10.3)
Chloride: 98 mmol/L — ABNORMAL LOW (ref 101–111)
Creatinine, Ser: 5.75 mg/dL — ABNORMAL HIGH (ref 0.44–1.00)
GFR calc Af Amer: 9 mL/min — ABNORMAL LOW (ref >60)
GFR calc non Af Amer: 8 mL/min — ABNORMAL LOW (ref >60)
Glucose, Bld: 92 mg/dL (ref 65–99)
Potassium: 4.3 mmol/L (ref 3.5–5.1)
Sodium: 139 mmol/L (ref 135–145)
Total Bilirubin: 0.5 mg/dL (ref 0.3–1.2)
Total Protein: 6.1 g/dL — ABNORMAL LOW (ref 6.5–8.1)

## 2015-02-05 LAB — URINALYSIS, ROUTINE W REFLEX MICROSCOPIC
Bilirubin Urine: NEGATIVE
Glucose, UA: NEGATIVE mg/dL
Ketones, ur: NEGATIVE mg/dL
Nitrite: NEGATIVE
Protein, ur: >300 mg/dL — AB
Specific Gravity, Urine: 1.008 (ref 1.005–1.030)
pH: 7.5 (ref 5.0–8.0)

## 2015-02-05 LAB — CBC
HCT: 31.7 % — ABNORMAL LOW (ref 36.0–46.0)
Hemoglobin: 10.1 g/dL — ABNORMAL LOW (ref 12.0–15.0)
MCH: 30.1 pg (ref 26.0–34.0)
MCHC: 31.9 g/dL (ref 30.0–36.0)
MCV: 94.6 fL (ref 78.0–100.0)
Platelets: 332 10*3/uL (ref 150–400)
RBC: 3.35 MIL/uL — ABNORMAL LOW (ref 3.87–5.11)
RDW: 14.3 % (ref 11.5–15.5)
WBC: 9.3 10*3/uL (ref 4.0–10.5)

## 2015-02-05 LAB — LIPASE, BLOOD: Lipase: 26 U/L (ref 11–51)

## 2015-02-05 LAB — URINE MICROSCOPIC-ADD ON

## 2015-02-05 MED ORDER — CEPHALEXIN 500 MG PO CAPS: 500.0000 mg | ORAL_CAPSULE | Freq: Once | ORAL | Status: DC

## 2015-02-05 MED ORDER — DOCUSATE SODIUM 100 MG PO CAPS: 100.0000 mg | ORAL_CAPSULE | Freq: Two times a day (BID) | ORAL | Status: DC

## 2015-02-05 MED ORDER — DEXTROSE 5 % IV SOLN
1.0000 g | Freq: Once | INTRAVENOUS | Status: AC
  Administered 2015-02-05: 1 g via INTRAVENOUS
  Filled 2015-02-05: qty 10

## 2015-02-05 MED ORDER — HYDROMORPHONE HCL 1 MG/ML IJ SOLN
1.0000 mg | Freq: Once | INTRAMUSCULAR | Status: AC
  Administered 2015-02-05: 1 mg via INTRAVENOUS
  Filled 2015-02-05: qty 1

## 2015-02-05 MED ORDER — FENTANYL CITRATE (PF) 100 MCG/2ML IJ SOLN
50.0000 ug | Freq: Once | INTRAMUSCULAR | Status: AC
  Administered 2015-02-05: 50 ug via INTRAVENOUS
  Filled 2015-02-05: qty 2

## 2015-02-05 NOTE — ED Provider Notes (Signed)
CSN: [id number]     Arrival date & time 02/05/15  [id number] History   First MD Initiated Contact with Patient 02/05/15 1019     Chief Complaint  Patient presents with  . Flank Pain     (Consider location/radiation/quality/duration/timing/severity/associated sxs/prior Treatment) Patient is a 50 y.o. female presenting with flank pain.  Flank Pain This is a new problem. The current episode started yesterday. The problem occurs constantly. The problem has been unchanged. Associated symptoms include nausea. Pertinent negatives include no change in bowel habit, diaphoresis, fatigue, fever, urinary symptoms or vomiting. The symptoms are aggravated by twisting. She has tried oral narcotics and rest for the symptoms. The treatment provided no relief.    Corella Demchak is a 50 y.o F with ESRD on dialysis who presents to the ED today c/o R flank pain. Pt was at rest yesterday when she began having sharp R sided flank pain that travels to her R mid abdomen. Pt is constant, but is worsened with twisting and movement. Pt took home hydrocodone with no relief. Pt still makes urine twice per day, now c/o urgency. No dysuria or hematuria. Pt also report nausea, no vomiting. Denies diarrhea, melena, hematochezia, dizziness, syncope, vaginal bleeding, vaginal discharge.    Past Medical History  Diagnosis Date  . Hypertension   . Diabetes mellitus   . Gout   . Depression   . Gout   . Hyperlipidemia   . Blood transfusion without reported diagnosis   . Morbid obesity ([place])   . Chronic kidney disease     STAGE 3 CHRONIC KIDNEY DISEASE SECONDARY TO DIABETIC GLOMERULOSCLEROSIS AND UNCONTROLLED HYPERTENSION - PER OFFICE NOTES DR. Flore[facility].  Marland Kitchen Headache(784.0)     CHRONIC H/A'S  . Pain     LEFT SHOULDER PAIN - WAS SEEN AT AN URGENT CARE - GIVEN SLING FOR COMFORT AND TOLD ROM AS TOLERATED.   Past Surgical History  Procedure Laterality Date  . Cesarean section  1988, 1993  . Carpal tunnel  release  2008  . Laparoscopic gastric sleeve resection N/A 07/19/2012    Procedure: LAPAROSCOPIC SLEEVE GASTRECTOMY with EGD;  Surgeon: Madilyn Hook, DO;  Location: [facility];  Service: General;  Laterality: N/A;  laparoscopic sleeve gastrectomy with EGD  . Upper gi endoscopy N/A 07/19/2012    Procedure: UPPER GI ENDOSCOPY;  Surgeon: Madilyn Hook, DO;  Location: [facility];  Service: General;  Laterality: N/A;  . Esophagogastroduodenoscopy N/A 09/04/2012    Procedure: ESOPHAGOGASTRODUODENOSCOPY (EGD);  Surgeon: Shann Medal, MD;  Location: [facility];  Service: General;  Laterality: N/A;  PF  . Esophagogastroduodenoscopy (egd) with esophageal dilation N/A 09/29/2012    Procedure: ESOPHAGOGASTRODUODENOSCOPY (EGD) WITH ESOPHAGEAL DILATION;  Surgeon: Milus Banister, MD;  Location: [facility];  Service: Endoscopy;  Laterality: N/A;  . Av fistula placement Left 01/03/2015    Procedure: BRACHIAL CEPHALIC ARTERIOVENOUS  FISTULA CREATION LEFT ARM;  Surgeon: Angelia Mould, MD;  Location: [facility];  Service: Vascular;  Laterality: Left;  . Insertion of dialysis catheter N/A 01/03/2015    Procedure: INSERTION OF DIALYSIS CATHETER RIGHT INTERNAL JUGULAR;  Surgeon: Angelia Mould, MD;  Location: [facility];  Service: Vascular;  Laterality: N/A;   Family History  Problem Relation Age of Onset  . Hypertension Mother   . Diabetes Father   . Arthritis Other   . Hyperlipidemia Other   . Cancer Neg Hx   . Heart disease Neg Hx   . Kidney disease Neg Hx   .  Stroke Neg Hx    Social History  Substance Use Topics  . Smoking status: Former Smoker -- 20 years    Types: Cigarettes  . Smokeless tobacco: Never Used     Comment: QUIT SMOKING ABOUT 2009  . Alcohol Use: No   OB History    No data available     Review of Systems  Constitutional: Negative for fever, diaphoresis and fatigue.  Gastrointestinal: Positive for nausea. Negative for vomiting and change in bowel habit.  Genitourinary: Positive for  flank pain.      Allergies  Clonidine derivatives and Welchol  Home Medications   Prior to Admission medications   Medication Sig Start Date End Date Taking? Authorizing Provider  allopurinol (ZYLOPRIM) 100 MG tablet Take 1 tablet (100 mg total) by mouth daily. Patient taking differently: Take 100 mg by mouth daily as needed (for gout flare ups).  09/13/14  Yes Biagio Borg, MD  amLODipine (NORVASC) 10 MG tablet Take 1 tablet (10 mg total) by mouth daily. 01/07/15  Yes Eugenie Filler, MD  calcium acetate (PHOSLO) 667 MG capsule Take 2 capsules (1,334 mg total) by mouth 3 (three) times daily with meals. 01/07/15  Yes Eugenie Filler, MD  chlorpheniramine-HYDROcodone [facility] PENNKINETIC ER) 10-8 MG/5ML SUER Take 5 mLs by mouth every 12 (twelve) hours as needed for cough. 01/23/15  Yes [name], MD  irbesartan (AVAPRO) 300 MG tablet Take 1 tablet (300 mg total) by mouth at bedtime. 01/07/15  Yes Eugenie Filler, MD  multivitamin (RENA-VIT) TABS tablet Take 1 tablet by mouth at bedtime. 01/07/15  Yes Eugenie Filler, MD  nebivolol (BYSTOLIC) 5 MG tablet Take 1 tablet (5 mg total) by mouth 2 (two) times daily. 01/07/15  Yes Eugenie Filler, MD  oxycodone (OXY-IR) 5 MG capsule Take 1 capsule (5 mg total) by mouth every 4 (four) hours as needed. Patient taking differently: Take 5 mg by mouth every 4 (four) hours as needed for pain.  01/28/15  Yes [name], MD  colchicine 0.6 MG tablet Take 1 tablet (0.6 mg total) by mouth 2 (two) times daily. Patient taking differently: Take 0.6 mg by mouth 2 (two) times daily as needed.  01/29/15   [name], MD  hydrOXYzine (ATARAX/VISTARIL) 25 MG tablet Take 1 tablet (25 mg total) by mouth 3 (three) times daily as needed for itching. Patient not taking: Reported on 02/05/2015 01/07/15   Eugenie Filler, MD  methylPREDNISolone (MEDROL DOSEPAK) 4 MG TBPK tablet TAKE AS DIRECTED Patient not taking: Reported on 02/05/2015 01/22/15   [name], MD  predniSONE (STERAPRED UNI-PAK 21 TAB) 5 MG (21) TBPK tablet Take 6 tablets x 1 day, 5 tablets x 1 day, 4 tablets x 1 day, 3 tablets x 1 day, 2 tablets x 1 day, 1 tablet x 1 day Patient not taking: Reported on 02/05/2015 01/29/15   Golden Circle, FNP   BP 173/84 mmHg  Pulse 85  Temp(Src) 98.2 F (36.8 C) (Oral)  Resp 12  SpO2 98% Physical Exam  Constitutional: She is oriented to person, place, and time. She appears well-developed and well-nourished. No distress.  HENT:  Head: Normocephalic and atraumatic.  Mouth/Throat: No oropharyngeal exudate.  Eyes: Conjunctivae and EOM are normal. Pupils are equal, round, and reactive to light. Right eye exhibits no discharge. Left eye exhibits no discharge. No scleral icterus.  Cardiovascular: Normal rate, regular rhythm, normal heart sounds and intact distal pulses.  Exam reveals no  gallop and no friction rub.   No murmur heard. Pulmonary/Chest: Effort normal and breath sounds normal. No respiratory distress. She has no wheezes. She has no rales. She exhibits no tenderness.  Abdominal: Soft. She exhibits no distension. There is tenderness ( R mid abdominal/flank tenderness to palpation). There is no guarding.  Musculoskeletal: Normal range of motion. She exhibits no edema.  Neurological: She is alert and oriented to person, place, and time.  Strength 5/5 throughout. No sensory deficits.  No gait abnormality.  Skin: Skin is warm and dry. No rash noted. She is not diaphoretic. No erythema. No pallor.  Psychiatric: She has a normal mood and affect. Her behavior is normal.  Nursing note and vitals reviewed.   ED Course  Procedures (including critical care time) Labs Review Labs Reviewed  COMPREHENSIVE METABOLIC PANEL - Abnormal; Notable for the following:    Chloride 98 (*)    BUN 23 (*)    Creatinine, Ser 5.75 (*)    Total Protein 6.1 (*)    Albumin 3.0 (*)    GFR calc non Af Amer 8 (*)    GFR calc Af Amer 9 (*)    All other  components within normal limits  CBC - Abnormal; Notable for the following:    RBC 3.35 (*)    Hemoglobin 10.1 (*)    HCT 31.7 (*)    All other components within normal limits  URINALYSIS, ROUTINE W REFLEX MICROSCOPIC (NOT AT [facility]) - Abnormal; Notable for the following:    APPearance CLOUDY (*)    Hgb urine dipstick SMALL (*)    Protein, ur >300 (*)    Leukocytes, UA LARGE (*)    All other components within normal limits  URINE MICROSCOPIC-ADD ON - Abnormal; Notable for the following:    Squamous Epithelial / LPF 6-30 (*)    Bacteria, UA FEW (*)    All other components within normal limits  LIPASE, BLOOD    Imaging Review No results found. I have personally reviewed and evaluated these images and lab results as part of my medical decision-making.   EKG Interpretation None      MDM   Final diagnoses:  Flank pain  Constipation, unspecified constipation type  UTI (lower urinary tract infection)  Gallstones  ESRD (end stage renal disease) ([place])   50 year old female with a past medical history of ESRD on dialysis presents to the emergency department today complaining of right mid abdominal/flank pain onset yesterday while at rest. Pain is constant but worsened with movement. No dysuria or hematuria present. Patient does complain of urinary urgency. On exam patient appears to be in pain, tearful on exam. Abdomen is soft, no peritoneal signs. Concern for renal stone. Will order CT renal study. No leukocytosis. Renal function is impaired however this is patient's baseline. Patient will be getting dialysis tomorrow. UA reveals large leukocytes, TNTC wbc's and few bacteria. Patient given Rocephin as well as pain medication. CT reveals no nephrolithiasis or hydronephrosis. Moderate stool noted in the right common in transverse colon. Question tiny gallstones or layering sludge within the gallbladder.  Pain was adequately managed in the emergency department. Patient now with significant  symptomatic improvement. On reexam, patient's abdomen is soft and nontender. Do not suspect appendicitis, diverticulitis or other acute abdomen. We'll discharge home on antibiotics for UTI. Pharmacy was consulted for antibiotic recommendation given the patient is a dialysis patient. We'll also discharge home with a stool softener as CT revealed moderate stool retention. Patient may follow up  with PCP for gallstones. Discussed treatment plan with patient who is agreeable. Return precautions outlined in patient discharge instructions.  Patient was discussed with and seen by Dr. Eulis Foster who agrees with the treatment plan.       Dondra Spry [place], PA-C 02/08/15 2336  Daleen Bo, MD 02/08/15 [phone]

## 2015-02-05 NOTE — ED Notes (Signed)
Had gastric sleeve in 2014-- at [facility]-- has had no problems since, except bowel movements are a little irregular. Recent new dialysis patient-- started 12/14. Is a MWF dialysis pt.  States pain is nagging right lower quadrant-- constant-- worse in waves.

## 2015-02-05 NOTE — Discharge Instructions (Signed)
Urinary Tract Infection Urinary tract infections (UTIs) can develop anywhere along your urinary tract. Your urinary tract is your body's drainage system for removing wastes and extra water. Your urinary tract includes two kidneys, two ureters, a bladder, and a urethra. Your kidneys are a pair of bean-shaped organs. Each kidney is about the size of your fist. They are located below your ribs, one on each side of your spine. CAUSES Infections are caused by microbes, which are microscopic organisms, including fungi, viruses, and bacteria. These organisms are so small that they can only be seen through a microscope. Bacteria are the microbes that most commonly cause UTIs. SYMPTOMS  Symptoms of UTIs may vary by age and gender of the patient and by the location of the infection. Symptoms in young women typically include a frequent and intense urge to urinate and a painful, burning feeling in the bladder or urethra during urination. Older women and men are more likely to be tired, shaky, and weak and have muscle aches and abdominal pain. A fever may mean the infection is in your kidneys. Other symptoms of a kidney infection include pain in your back or sides below the ribs, nausea, and vomiting. DIAGNOSIS To diagnose a UTI, your caregiver will ask you about your symptoms. Your caregiver will also ask you to provide a urine sample. The urine sample will be tested for bacteria and white blood cells. White blood cells are made by your body to help fight infection. TREATMENT  Typically, UTIs can be treated with medication. Because most UTIs are caused by a bacterial infection, they usually can be treated with the use of antibiotics. The choice of antibiotic and length of treatment depend on your symptoms and the type of bacteria causing your infection. HOME CARE INSTRUCTIONS  If you were prescribed antibiotics, take them exactly as your caregiver instructs you. Finish the medication even if you feel better after  you have only taken some of the medication.  Drink enough water and fluids to keep your urine clear or pale yellow.  Avoid caffeine, tea, and carbonated beverages. They tend to irritate your bladder.  Empty your bladder often. Avoid holding urine for long periods of time.  Empty your bladder before and after sexual intercourse.  After a bowel movement, women should cleanse from front to back. Use each tissue only once. SEEK MEDICAL CARE IF:   You have back pain.  You develop a fever.  Your symptoms do not begin to resolve within 3 days. SEEK IMMEDIATE MEDICAL CARE IF:   You have severe back pain or lower abdominal pain.  You develop chills.  You have nausea or vomiting.  You have continued burning or discomfort with urination. MAKE SURE YOU:   Understand these instructions.  Will watch your condition.  Will get help right away if you are not doing well or get worse.   This information is not intended to replace advice given to you by your health care provider. Make sure you discuss any questions you have with your health care provider.   Follow up with your primary care provider if symptoms do not improve. You are being treated for a UTI today. Take antibiotics as prescribed after dialysis. You also appear to have constipation on CT scan. Take stool softener daily to prevent. This may help relieve your abdominal pain. Take home pain medication as needed for pain. Return to the ED if you experience worsening of your pain, burning with urination, blood in urine or stool, fever, vomiting.

## 2015-02-05 NOTE — ED Notes (Signed)
Pt sts right sided flank and abd pain starting yesterday; pt dialysis pt with last dialysis yesterday

## 2015-02-05 NOTE — ED Provider Notes (Signed)
  Face-to-face evaluation   History: She presents for evaluation of right lower quadrant abdominal pain, sharp in nature, since yesterday. She denies dysuria or urinary frequency. No nausea, vomiting, fever.  Physical exam: Alert, obese, in no apparent distress . Abdomen is soft and nontender to palpation.   Medical screening examination/treatment/procedure(s) were conducted as a shared visit with non-physician practitioner(s) and myself.  I personally evaluated the patient during the encounter  Daleen Bo, MD 02/08/15 2339

## 2015-02-06 LAB — URINE CULTURE

## 2015-02-08 ENCOUNTER — Ambulatory Visit (INDEPENDENT_AMBULATORY_CARE_PROVIDER_SITE_OTHER): Payer: 59 | Admitting: Nurse Practitioner

## 2015-02-08 ENCOUNTER — Encounter: Payer: Self-pay | Admitting: Nurse Practitioner

## 2015-02-08 VITALS — BP 130/60 | HR 70 | Temp 97.6°F | Resp 16 | Ht 65.0 in | Wt 261.0 lb

## 2015-02-08 DIAGNOSIS — R1031 Right lower quadrant pain: Secondary | ICD-10-CM | POA: Diagnosis not present

## 2015-02-08 NOTE — Patient Instructions (Signed)
Toast, applesauce, soups ect...   Make sure you rest and please seek emergency care if anything changes this weekend.

## 2015-02-08 NOTE — Progress Notes (Signed)
Pre visit review using our clinic review tool, if applicable. No additional management support is needed unless otherwise documented below in the visit note. 

## 2015-02-08 NOTE — Assessment & Plan Note (Signed)
Stable today from ER yesterday Discharge summary reviewed CT renal stone study was not significant for anything except for tiny gallstones or sludge possibly. I discussed this with the patient and she is stable in office, well appearing, VS improved. Watch fatty/fried foods, no need for general surgery consult at this time.  Follow up in 1 week with PCP

## 2015-02-08 NOTE — Progress Notes (Signed)
Patient ID: Brenda Davis, female    DOB: [birth year]  Age: 50 y.o. MRN: [id number]  CC: Gall Stones   HPI Brenda Davis presents for ER follow up from 02/05/15   1) Went to [facility] ED on 1/17 for RLQ abdominal pain, nausea, and flank pain  Baked wing and green beans- vomited yesterday  Ate a "[place] neck" the other day, rice 1 tsp- vomited  Applesauce- 1 am this morning and kept it down  Last BM- 2 days ago  Stool softener given by ER Taking Oxycodone every 4 hours for pain- takes edge off Urine output no changes  Next dialysis treatment Monday   CT Renal Stone Study-  IMPRESSION: 1. No nephrolithiasis. No hydronephrosis or hydroureter. 2. No calcified ureteral calculi are noted. 3. Colonic stool as described above. No colonic obstruction. 4. Colonic diverticula are noted descending colon and proximal sigmoid colon. No evidence of acute diverticulitis. 5. No small bowel obstruction. 6. Stable postsurgical changes within stomach. 7. Question tiny gallstones or layering sludge within gallbladder.  History Brenda Davis has a past medical history of Hypertension; Diabetes mellitus; Gout; Depression; Gout; Hyperlipidemia; Blood transfusion without reported diagnosis; Morbid obesity ([place]); Chronic kidney disease; Headache(784.0); and Pain.   She has past surgical history that includes Cesarean section (1988, 1993); Carpal tunnel release (2008); Laparoscopic gastric sleeve resection (N/A, 07/19/2012); Upper gi endoscopy (N/A, 07/19/2012); Esophagogastroduodenoscopy (N/A, 09/04/2012); Esophagogastroduodenoscopy (egd) with esophageal dilation (N/A, 09/29/2012); AV fistula placement (Left, 01/03/2015); and Insertion of dialysis catheter (N/A, 01/03/2015).   Her family history includes Arthritis in her other; Diabetes in her father; Hyperlipidemia in her other; Hypertension in her mother. There is no history of Cancer, Heart disease, Kidney disease, or Stroke.She reports that she has quit smoking. Her  smoking use included Cigarettes. She quit after 20 years of use. She has never used smokeless tobacco. She reports that she does not drink alcohol or use illicit drugs.  Outpatient Prescriptions Prior to Visit  Medication Sig Dispense Refill  . allopurinol (ZYLOPRIM) 100 MG tablet Take 1 tablet (100 mg total) by mouth daily. (Patient taking differently: Take 100 mg by mouth daily as needed (for gout flare ups). ) 90 tablet 3  . amLODipine (NORVASC) 10 MG tablet Take 1 tablet (10 mg total) by mouth daily. 30 tablet 0  . calcium acetate (PHOSLO) 667 MG capsule Take 2 capsules (1,334 mg total) by mouth 3 (three) times daily with meals. 180 capsule 0  . cephALEXin (KEFLEX) 500 MG capsule Take 1 capsule (500 mg total) by mouth once. Take medication AFTER dialysis 5 capsule 0  . chlorpheniramine-HYDROcodone (TUSSIONEX PENNKINETIC ER) 10-8 MG/5ML SUER Take 5 mLs by mouth every 12 (twelve) hours as needed for cough. 140 mL 0  . colchicine 0.6 MG tablet Take 1 tablet (0.6 mg total) by mouth 2 (two) times daily. (Patient taking differently: Take 0.6 mg by mouth 2 (two) times daily as needed. ) 60 tablet 3  . docusate sodium (COLACE) 100 MG capsule Take 1 capsule (100 mg total) by mouth every 12 (twelve) hours. 60 capsule 0  . hydrOXYzine (ATARAX/VISTARIL) 25 MG tablet Take 1 tablet (25 mg total) by mouth 3 (three) times daily as needed for itching. 30 tablet 0  . irbesartan (AVAPRO) 300 MG tablet Take 1 tablet (300 mg total) by mouth at bedtime. 30 tablet 0  . methylPREDNISolone (MEDROL DOSEPAK) 4 MG TBPK tablet TAKE AS DIRECTED 21 tablet 0  . multivitamin (RENA-VIT) TABS tablet Take 1 tablet by mouth at  bedtime. 30 tablet 0  . nebivolol (BYSTOLIC) 5 MG tablet Take 1 tablet (5 mg total) by mouth 2 (two) times daily. 60 tablet 1  . oxycodone (OXY-IR) 5 MG capsule Take 1 capsule (5 mg total) by mouth every 4 (four) hours as needed. (Patient taking differently: Take 5 mg by mouth every 4 (four) hours as needed  for pain. ) 50 capsule 0  . predniSONE (STERAPRED UNI-PAK 21 TAB) 5 MG (21) TBPK tablet Take 6 tablets x 1 day, 5 tablets x 1 day, 4 tablets x 1 day, 3 tablets x 1 day, 2 tablets x 1 day, 1 tablet x 1 day 21 tablet 0   No facility-administered medications prior to visit.    ROS Review of Systems  Constitutional: Negative for fever, chills, diaphoresis and fatigue.  HENT: Negative for trouble swallowing.   Eyes: Negative for visual disturbance.  Respiratory: Negative for cough, chest tightness, shortness of breath and wheezing.   Cardiovascular: Negative for chest pain, palpitations and leg swelling.  Gastrointestinal: Positive for nausea, abdominal pain and constipation. Negative for vomiting, diarrhea and abdominal distention.  Skin: Negative for rash.    Objective:  BP 130/60 mmHg  Pulse 70  Temp(Src) 97.6 F (36.4 C) (Oral)  Resp 16  Ht 5\' 5"  (1.651 m)  Wt 261 lb (118.389 kg)  BMI 43.43 kg/m2  SpO2 98%  Physical Exam  Constitutional: She is oriented to person, place, and time. She appears well-developed and well-nourished. No distress.  HENT:  Head: Normocephalic and atraumatic.  Right Ear: External ear normal.  Left Ear: External ear normal.  Abdominal: Soft. Bowel sounds are normal. She exhibits no distension and no mass. There is tenderness. There is no rebound and no guarding.  Right lower abdomen and laterally tender to light palpation  Obese  Neurological: She is alert and oriented to person, place, and time.  Skin: Skin is warm and dry. No rash noted. She is not diaphoretic.  Psychiatric: She has a normal mood and affect. Her behavior is normal. Judgment and thought content normal.   Assessment & Plan:   Brenda Davis was seen today for gall stones.  Diagnoses and all orders for this visit:  Right lower quadrant pain  I have discontinued Ms. Fransisca Connors predniSONE. I am also having her maintain her allopurinol, amLODipine, calcium acetate, hydrOXYzine, irbesartan,  multivitamin, nebivolol, methylPREDNISolone, chlorpheniramine-HYDROcodone, oxycodone, colchicine, cephALEXin, and docusate sodium.  No orders of the defined types were placed in this encounter.     Follow-up: Return in about 1 week (around 02/15/2015) for Dr. Ronnald Ramp for tiny gallstones follow up .

## 2015-02-11 ENCOUNTER — Encounter: Payer: Self-pay | Admitting: Vascular Surgery

## 2015-02-19 ENCOUNTER — Ambulatory Visit: Payer: 59 | Admitting: Internal Medicine

## 2015-02-20 ENCOUNTER — Ambulatory Visit (HOSPITAL_COMMUNITY)
Admission: RE | Admit: 2015-02-20 | Discharge: 2015-02-20 | Disposition: A | Discharge status: Home / Self Care | Payer: 59 | Source: Ambulatory Visit | Attending: Vascular Surgery | Admitting: Vascular Surgery

## 2015-02-20 ENCOUNTER — Ambulatory Visit (INDEPENDENT_AMBULATORY_CARE_PROVIDER_SITE_OTHER): Payer: Self-pay | Admitting: Vascular Surgery

## 2015-02-20 ENCOUNTER — Encounter: Payer: Self-pay | Admitting: Vascular Surgery

## 2015-02-20 VITALS — BP 133/86 | HR 67 | Temp 97.6°F | Resp 16 | Ht 66.0 in | Wt 261.0 lb

## 2015-02-20 DIAGNOSIS — E785 Hyperlipidemia, unspecified: Secondary | ICD-10-CM | POA: Insufficient documentation

## 2015-02-20 DIAGNOSIS — Z992 Dependence on renal dialysis: Secondary | ICD-10-CM

## 2015-02-20 DIAGNOSIS — I120 Hypertensive chronic kidney disease with stage 5 chronic kidney disease or end stage renal disease: Secondary | ICD-10-CM | POA: Insufficient documentation

## 2015-02-20 DIAGNOSIS — N186 End stage renal disease: Secondary | ICD-10-CM

## 2015-02-20 DIAGNOSIS — Z48812 Encounter for surgical aftercare following surgery on the circulatory system: Secondary | ICD-10-CM

## 2015-02-20 DIAGNOSIS — E1122 Type 2 diabetes mellitus with diabetic chronic kidney disease: Secondary | ICD-10-CM | POA: Diagnosis not present

## 2015-02-20 NOTE — Progress Notes (Signed)
  POST OPERATIVE OFFICE NOTE    CC:  F/u for surgery  HPI:  This is a 50 y.o. female who is s/p Left brachial Cephalic fistula 16/10/9602.  She is doing well without symptoms of steal.  She is currently on HD via left IJ catheter.  No fever or chills.    Allergies  Allergen Reactions  . Clonidine Derivatives Swelling  . Welchol [Colesevelam Hcl] Nausea Only    Current Outpatient Prescriptions  Medication Sig Dispense Refill  . allopurinol (ZYLOPRIM) 100 MG tablet Take 1 tablet (100 mg total) by mouth daily. (Patient taking differently: Take 100 mg by mouth daily as needed (for gout flare ups). ) 90 tablet 3  . amLODipine (NORVASC) 10 MG tablet Take 1 tablet (10 mg total) by mouth daily. 30 tablet 0  . calcium acetate (PHOSLO) 667 MG capsule Take 2 capsules (1,334 mg total) by mouth 3 (three) times daily with meals. 180 capsule 0  . cephALEXin (KEFLEX) 500 MG capsule Take 1 capsule (500 mg total) by mouth once. Take medication AFTER dialysis 5 capsule 0  . chlorpheniramine-HYDROcodone (TUSSIONEX PENNKINETIC ER) 10-8 MG/5ML SUER Take 5 mLs by mouth every 12 (twelve) hours as needed for cough. 140 mL 0  . colchicine 0.6 MG tablet Take 1 tablet (0.6 mg total) by mouth 2 (two) times daily. (Patient taking differently: Take 0.6 mg by mouth 2 (two) times daily as needed. ) 60 tablet 3  . docusate sodium (COLACE) 100 MG capsule Take 1 capsule (100 mg total) by mouth every 12 (twelve) hours. 60 capsule 0  . hydrOXYzine (ATARAX/VISTARIL) 25 MG tablet Take 1 tablet (25 mg total) by mouth 3 (three) times daily as needed for itching. 30 tablet 0  . irbesartan (AVAPRO) 300 MG tablet Take 1 tablet (300 mg total) by mouth at bedtime. 30 tablet 0  . methylPREDNISolone (MEDROL DOSEPAK) 4 MG TBPK tablet TAKE AS DIRECTED 21 tablet 0  . multivitamin (RENA-VIT) TABS tablet Take 1 tablet by mouth at bedtime. 30 tablet 0  . nebivolol (BYSTOLIC) 5 MG tablet Take 1 tablet (5 mg total) by mouth 2 (two) times daily.  60 tablet 1  . oxycodone (OXY-IR) 5 MG capsule Take 1 capsule (5 mg total) by mouth every 4 (four) hours as needed. (Patient taking differently: Take 5 mg by mouth as needed for pain. ) 50 capsule 0   No current facility-administered medications for this visit.     ROS:  See HPI  Physical Exam:  Filed Vitals:   02/20/15 1510  BP: 133/86  Pulse: 67  Temp: 97.6 F (36.4 C)  Resp: 16    Incision:  Well healed  Extremities:  N/V/M intact.  Excellent palpable thrill in left AV fistula.  Fistula duplex shows Diameter of 0.53 - 0.81 and depth 0.33-0.63  Assessment/Plan:  This is a 50 y.o. female who is s/p: Left BC  av fistula creation.  The fistula is maturing well.  She may start to use it at 12 weeks from surgery 01/03/2015.   F/U PRN     Laurence Slate [facility] PA-C [facility] [phone]  Clinic MD:  Pt seen and examined with Dr. Scot Dock

## 2015-02-25 ENCOUNTER — Encounter: Payer: Self-pay | Admitting: Internal Medicine

## 2015-03-01 ENCOUNTER — Telehealth: Payer: Self-pay | Admitting: *Deleted

## 2015-03-01 ENCOUNTER — Encounter: Payer: Self-pay | Admitting: Nurse Practitioner

## 2015-03-01 ENCOUNTER — Ambulatory Visit (INDEPENDENT_AMBULATORY_CARE_PROVIDER_SITE_OTHER): Payer: 59 | Admitting: Nurse Practitioner

## 2015-03-01 VITALS — BP 134/86 | HR 83 | Temp 98.0°F | Resp 16 | Wt 257.0 lb

## 2015-03-01 DIAGNOSIS — M109 Gout, unspecified: Secondary | ICD-10-CM

## 2015-03-01 MED ORDER — METHYLPREDNISOLONE 4 MG PO TBPK: ORAL_TABLET | ORAL | Status: DC

## 2015-03-01 NOTE — Progress Notes (Signed)
Pre visit review using our clinic review tool, if applicable. No additional management support is needed unless otherwise documented below in the visit note. 

## 2015-03-01 NOTE — Progress Notes (Signed)
Patient ID: Brenda Davis, female    DOB: Jun 11, 1965  Age: 50 y.o. MRN: [id number]  CC: Gout   HPI [name] presents for gout flare x 1 day.  1) Every 2 months having 1 flare x 6 months of this.   Colchicine taking 1 daily currently, but not the allopurinol   Hit at 1 am this morning, woke up and hobbled to the bathroom she reports. Pain with flexing and extending foot   Eating beef, gravy  No legumes or organ meats, nor alcohol  Has list of high purine foods from dialysis)   History Lonia has a past medical history of Hypertension; Diabetes mellitus; Gout; Depression; Gout; Hyperlipidemia; Blood transfusion without reported diagnosis; Morbid obesity ([place]); Chronic kidney disease; Headache(784.0); and Pain.   She has past surgical history that includes Cesarean section (1988, 1993); Carpal tunnel release (2008); Laparoscopic gastric sleeve resection (N/A, 07/19/2012); Upper gi endoscopy (N/A, 07/19/2012); Esophagogastroduodenoscopy (N/A, 09/04/2012); Esophagogastroduodenoscopy (egd) with esophageal dilation (N/A, 09/29/2012); AV fistula placement (Left, 01/03/2015); and Insertion of dialysis catheter (N/A, 01/03/2015).   Her family history includes Arthritis in her other; Diabetes in her father; Hyperlipidemia in her other; Hypertension in her mother. There is no history of Cancer, Heart disease, Kidney disease, or Stroke.She reports that she has quit smoking. Her smoking use included Cigarettes. She quit after 20 years of use. She has never used smokeless tobacco. She reports that she does not drink alcohol or use illicit drugs.  Outpatient Prescriptions Prior to Visit  Medication Sig Dispense Refill  . amLODipine (NORVASC) 10 MG tablet Take 1 tablet (10 mg total) by mouth daily. 30 tablet 0  . calcium acetate (PHOSLO) 667 MG capsule Take 2 capsules (1,334 mg total) by mouth 3 (three) times daily with meals. 180 capsule 0  . chlorpheniramine-HYDROcodone (TUSSIONEX PENNKINETIC ER)  10-8 MG/5ML SUER Take 5 mLs by mouth every 12 (twelve) hours as needed for cough. 140 mL 0  . colchicine 0.6 MG tablet Take 1 tablet (0.6 mg total) by mouth 2 (two) times daily. (Patient taking differently: Take 0.6 mg by mouth 2 (two) times daily as needed. ) 60 tablet 3  . docusate sodium (COLACE) 100 MG capsule Take 1 capsule (100 mg total) by mouth every 12 (twelve) hours. 60 capsule 0  . hydrOXYzine (ATARAX/VISTARIL) 25 MG tablet Take 1 tablet (25 mg total) by mouth 3 (three) times daily as needed for itching. 30 tablet 0  . irbesartan (AVAPRO) 300 MG tablet Take 1 tablet (300 mg total) by mouth at bedtime. 30 tablet 0  . methylPREDNISolone (MEDROL DOSEPAK) 4 MG TBPK tablet TAKE AS DIRECTED 21 tablet 0  . multivitamin (RENA-VIT) TABS tablet Take 1 tablet by mouth at bedtime. 30 tablet 0  . nebivolol (BYSTOLIC) 5 MG tablet Take 1 tablet (5 mg total) by mouth 2 (two) times daily. 60 tablet 1  . oxycodone (OXY-IR) 5 MG capsule Take 1 capsule (5 mg total) by mouth every 4 (four) hours as needed. (Patient taking differently: Take 5 mg by mouth as needed for pain. ) 50 capsule 0  . allopurinol (ZYLOPRIM) 100 MG tablet Take 1 tablet (100 mg total) by mouth daily. (Patient not taking: Reported on 03/01/2015) 90 tablet 3  . cephALEXin (KEFLEX) 500 MG capsule Take 1 capsule (500 mg total) by mouth once. Take medication AFTER dialysis 5 capsule 0   No facility-administered medications prior to visit.    ROS Review of Systems  Constitutional: Negative for fever, chills, diaphoresis and  fatigue.  Gastrointestinal: Negative for nausea, vomiting and diarrhea.  Musculoskeletal: Positive for arthralgias.       Left ankle  Skin: Negative for color change, pallor, rash and wound.   Objective:  BP 134/86 mmHg  Pulse 83  Temp(Src) 98 F (36.7 C) (Oral)  Resp 16  Wt 257 lb (116.574 kg)  SpO2 98%  Physical Exam  Constitutional: She is oriented to person, place, and time. She appears well-developed and  well-nourished. No distress.  HENT:  Head: Normocephalic and atraumatic.  Right Ear: External ear normal.  Left Ear: External ear normal.  Musculoskeletal: She exhibits edema and tenderness.       Feet:  Exquisite tenderness of left medial malleolus and superiorly. Swelling, no erythema at this time Decreased ROM  Neurological: She is alert and oriented to person, place, and time.  Limp of left LE  Skin: Skin is warm and dry. No rash noted. She is not diaphoretic.  Psychiatric: She has a normal mood and affect. Her behavior is normal. Judgment and thought content normal.   Assessment & Plan:   There are no diagnoses linked to this encounter. I have discontinued Ms. Fransisca Connors cephALEXin. I am also having her maintain her allopurinol, amLODipine, calcium acetate, hydrOXYzine, irbesartan, multivitamin, nebivolol, methylPREDNISolone, chlorpheniramine-HYDROcodone, oxycodone, colchicine, and docusate sodium.  No orders of the defined types were placed in this encounter.     Follow-up: No Follow-up on file.

## 2015-03-01 NOTE — Assessment & Plan Note (Signed)
New problem to me Prednisone taper sent to pharmacy- finished dialysis this morning, none for 2 days.  Instructions on AVS and given verbally Do not start back on allopurinol until flare has subsided One flare is over- 1 colchicine and 1 allopurinol daily Follow closely with PCP

## 2015-03-01 NOTE — Telephone Encounter (Signed)
Was seen by Lorane Gell this morning. No further action needed.

## 2015-03-01 NOTE — Telephone Encounter (Signed)
Left msg on triage stating having a gout flare-up in her (L) ankle. Wanting to see if md can rx some prednisone to help with sxs. MD is out of office pls advise...Johny Chess

## 2015-03-01 NOTE — Patient Instructions (Addendum)
Prednisone with breakfast or lunch at the latest.  6 tablets on day 1, 5 tablets on day 2, 4 tablets on day 3, 3 tablets on day 4, 2 tablets day 5, 1 tablet on day 6...done! Take tablets all together not spaced out Don't take with NSAIDs (Ibuprofen, Aleve, Naproxen, Meloxicam ect...)  Take only 1 colchicine add the allopurinol 1 tablet daily after the flare.

## 2015-03-25 ENCOUNTER — Ambulatory Visit: Payer: 59

## 2015-04-15 ENCOUNTER — Ambulatory Visit (INDEPENDENT_AMBULATORY_CARE_PROVIDER_SITE_OTHER): Payer: 59 | Admitting: Physician Assistant

## 2015-04-15 VITALS — BP 140/70 | HR 77 | Temp 98.2°F | Resp 18 | Ht 65.0 in | Wt 266.8 lb

## 2015-04-15 DIAGNOSIS — N185 Chronic kidney disease, stage 5: Secondary | ICD-10-CM

## 2015-04-15 DIAGNOSIS — R52 Pain, unspecified: Secondary | ICD-10-CM

## 2015-04-15 DIAGNOSIS — J101 Influenza due to other identified influenza virus with other respiratory manifestations: Secondary | ICD-10-CM

## 2015-04-15 LAB — POCT INFLUENZA A/B
Influenza A, POC: NEGATIVE
Influenza B, POC: POSITIVE — AB

## 2015-04-15 MED ORDER — OSELTAMIVIR PHOSPHATE 30 MG PO CAPS: 30.0000 mg | ORAL_CAPSULE | Freq: Every day | ORAL | Status: DC

## 2015-04-15 MED ORDER — BENZONATATE 100 MG PO CAPS: 100.0000 mg | ORAL_CAPSULE | Freq: Three times a day (TID) | ORAL | Status: DC | PRN

## 2015-04-15 NOTE — Progress Notes (Signed)
[facility] [address], [place] [address] [phone]  Date:  04/15/2015   Name:  Brenda Davis   DOB:  [birth year]   MRN:  [id number]  PCP:  Scarlette Calico, MD    Chief Complaint: Cough; Nasal Congestion; and Fatigue   History of Present Illness:  This is a 50 y.o. female with PMH HLD, HTN, CKD stage 5 on dialysis who is presenting with cough, nasal congestion and fatigue x 2 days. Having body aches and poor appetite. Cough is dry. Denies sob or wheezing. Denies sore throat. Hasn't check temp at home.  Aggravating/alleviating factors: took mucinex and no help. History of asthma: no History of env allergies: no Tobacco use: no Did not get flu shot this season. Husband with similar symptoms that started a few days prior.  Goes to dialysis MWF. Missed session today d/t this illness. She is on 40 oz fluid limit. She has been keeping up with that volume since being sick. Goes to [facility], sees Dr. Marval Regal.  Review of Systems:  Review of Systems See HPI  Patient Active Problem List   Diagnosis Date Noted  . Right lower quadrant pain 02/08/2015  . Cough 01/23/2015  . Sinus pause 01/04/2015  . S/P dialysis catheter insertion ([place])   . Hyperglycemia 12/31/2014  . Acute gouty arthritis 09/13/2014  . Thiamine deficiency 12/02/2012  . Pernicious anemia 11/28/2012  . Hypokalemia 11/28/2012  . Meralgia paraesthetica 11/28/2012  . S/P laparoscopic sleeve gastrectomy 11/28/2012  . Folate deficiency anemia 11/28/2012  . Hyperlipidemia with target LDL less than 100 04/09/2008  . CKD (chronic kidney disease) stage 5, GFR less than 15 ml/min (HCC) 02/08/2008  . Proteinuria 01/16/2008  . HYPERTENSION, BENIGN ESSENTIAL 01/20/1999    Prior to Admission medications   Medication Sig Start Date End Date Taking? Authorizing Provider  allopurinol (ZYLOPRIM) 100 MG tablet Take 1 tablet (100 mg total) by mouth daily. 09/13/14  Yes Biagio Borg, MD   amLODipine (NORVASC) 10 MG tablet Take 1 tablet (10 mg total) by mouth daily. 01/07/15  Yes Eugenie Filler, MD  calcium acetate (PHOSLO) 667 MG capsule Take 2 capsules (1,334 mg total) by mouth 3 (three) times daily with meals. 01/07/15  Yes Eugenie Filler, MD       [name], MD  colchicine 0.6 MG tablet Take 1 tablet (0.6 mg total) by mouth 2 (two) times daily. Patient taking differently: Take 0.6 mg by mouth 2 (two) times daily as needed.  01/29/15  Yes [name], MD  irbesartan (AVAPRO) 300 MG tablet Take 1 tablet (300 mg total) by mouth at bedtime. 01/07/15  Yes Eugenie Filler, MD  methylPREDNISolone (MEDROL DOSEPAK) 4 MG TBPK tablet TAKE AS DIRECTED 03/01/15  Yes Rubbie Battiest, NP  multivitamin (RENA-VIT) TABS tablet Take 1 tablet by mouth at bedtime. 01/07/15  Yes Eugenie Filler, MD  nebivolol (BYSTOLIC) 5 MG tablet Take 1 tablet (5 mg total) by mouth 2 (two) times daily. 01/07/15  Yes Eugenie Filler, MD       Carlos Levering, PA-C       Eugenie Filler, MD       [name], MD    Allergies  Allergen Reactions  . Clonidine Derivatives Swelling  . Welchol [Colesevelam Hcl] Nausea Only    Past Surgical History  Procedure Laterality Date  . Cesarean section  1988, 1993  . Carpal tunnel release  2008  .  Laparoscopic gastric sleeve resection N/A 07/19/2012    Procedure: LAPAROSCOPIC SLEEVE GASTRECTOMY with EGD;  Surgeon: Madilyn Hook, DO;  Location: [facility];  Service: General;  Laterality: N/A;  laparoscopic sleeve gastrectomy with EGD  . Upper gi endoscopy N/A 07/19/2012    Procedure: UPPER GI ENDOSCOPY;  Surgeon: Madilyn Hook, DO;  Location: [facility];  Service: General;  Laterality: N/A;  . Esophagogastroduodenoscopy N/A 09/04/2012    Procedure: ESOPHAGOGASTRODUODENOSCOPY (EGD);  Surgeon: Shann Medal, MD;  Location: [facility];  Service: General;  Laterality: N/A;  PF  . Esophagogastroduodenoscopy (egd) with esophageal dilation N/A 09/29/2012     Procedure: ESOPHAGOGASTRODUODENOSCOPY (EGD) WITH ESOPHAGEAL DILATION;  Surgeon: Milus Banister, MD;  Location: [facility];  Service: Endoscopy;  Laterality: N/A;  . Av fistula placement Left 01/03/2015    Procedure: BRACHIAL CEPHALIC ARTERIOVENOUS  FISTULA CREATION LEFT ARM;  Surgeon: Angelia Mould, MD;  Location: [facility];  Service: Vascular;  Laterality: Left;  . Insertion of dialysis catheter N/A 01/03/2015    Procedure: INSERTION OF DIALYSIS CATHETER RIGHT INTERNAL JUGULAR;  Surgeon: Angelia Mould, MD;  Location: [facility];  Service: Vascular;  Laterality: N/A;    Social History  Substance Use Topics  . Smoking status: Former Smoker -- 20 years    Types: Cigarettes  . Smokeless tobacco: Never Used     Comment: QUIT SMOKING ABOUT 2009  . Alcohol Use: No    Family History  Problem Relation Age of Onset  . Hypertension Mother   . Diabetes Father   . Arthritis Other   . Hyperlipidemia Other   . Cancer Neg Hx   . Heart disease Neg Hx   . Kidney disease Neg Hx   . Stroke Neg Hx     Medication list has been reviewed and updated.  Physical Examination:  Physical Exam  Constitutional: She is oriented to person, place, and time. She appears well-developed and well-nourished. No distress.  HENT:  Head: Normocephalic and atraumatic.  Right Ear: Hearing, tympanic membrane, external ear and ear canal normal.  Left Ear: Hearing, tympanic membrane, external ear and ear canal normal.  Nose: Nose normal.  Mouth/Throat: Uvula is midline and mucous membranes are normal. Posterior oropharyngeal erythema present. No oropharyngeal exudate or posterior oropharyngeal edema.  Eyes: Conjunctivae and lids are normal. Right eye exhibits no discharge. Left eye exhibits no discharge. No scleral icterus.  Cardiovascular: Normal rate, regular rhythm, normal heart sounds and normal pulses.   No murmur heard. Pulmonary/Chest: Effort normal and breath sounds normal. No respiratory distress.  She has no wheezes. She has no rhonchi. She has no rales.  Musculoskeletal: Normal range of motion.  Lymphadenopathy:       Head (right side): No submental, no submandibular and no tonsillar adenopathy present.       Head (left side): No submental, no submandibular and no tonsillar adenopathy present.    She has no cervical adenopathy.  Neurological: She is alert and oriented to person, place, and time.  Skin: Skin is warm, dry and intact. No lesion and no rash noted.  Psychiatric: She has a normal mood and affect. Her speech is normal and behavior is normal. Thought content normal.   BP 142/98 mmHg  Pulse 77  Temp(Src) 98.2 F (36.8 C) (Oral)  Resp 18  Ht 5\' 5"  (1.651 m)  Wt 266 lb 12.8 oz (121.02 kg)  BMI 44.40 kg/m2  SpO2 94%  Results for orders placed or performed in visit on 04/15/15  POCT Influenza A/B  Result Value Ref Range   Influenza A, POC Negative Negative   Influenza B, POC Positive (A) Negative   Assessment and Plan:  1. Influenza B 2. Body aches 3. CKD stage 5 - on dialysis  Spoke with a PA at [facility] - they advised tamiflu 30 mg QD for 3 days. Tessalon for cough. 40 oz fluid limit. She will go to dialysis tomorrow 3/28 at 6 AM. Will go again for scheduled dialysis on 3/29. Return in 1 week if symptoms do not improve or at any time if symptoms worsen.  - benzonatate (TESSALON) 100 MG capsule; Take 1-2 capsules (100-200 mg total) by mouth 3 (three) times daily as needed for cough.  Dispense: 40 capsule; Refill: 0 - oseltamivir (TAMIFLU) 30 MG capsule; Take 1 capsule (30 mg total) by mouth daily.  Dispense: 3 capsule; Refill: 0 - POCT Influenza A/B   Benjaman Pott. Drenda Freeze, MHS [facility]  04/15/2015

## 2015-04-15 NOTE — Patient Instructions (Addendum)
Stay at 40 oz fluid limit until your talk with your doctor tomorrow. Go to dialysis at 6 AM tomorrow. Take tamiflu today, take again tomorrow after dialysis and then again on Wednesday after dialysis. You may take tessalon as needed for cough. Return if symptoms not improving in 7-8 days    IF you received an x-ray today, you will receive an invoice from [facility]. Please contact [facility] at [phone] with questions or concerns regarding your invoice.   IF you received labwork today, you will receive an invoice from [employer]. Please contact [facility] at [phone] with questions or concerns regarding your invoice.   Our billing staff will not be able to assist you with questions regarding bills from these companies.  You will be contacted with the lab results as soon as they are available. The fastest way to get your results is to activate your My Chart account. Instructions are located on the last page of this paperwork. If you have not heard from [place] regarding the results in 2 weeks, please contact this office.

## 2015-04-23 ENCOUNTER — Other Ambulatory Visit: Payer: Self-pay | Admitting: *Deleted

## 2015-04-23 DIAGNOSIS — T82510A Breakdown (mechanical) of surgically created arteriovenous fistula, initial encounter: Secondary | ICD-10-CM

## 2015-04-26 ENCOUNTER — Telehealth: Payer: Self-pay

## 2015-04-26 NOTE — Telephone Encounter (Signed)
Patient needs FMLA forms completed by [employer] for her treatment of the Flu from 04/15/15. I have completed the notes and all the needs to be done is sign it. I will place it in your box on 04/26/15 if you could sign and return to the FMLA/Disability box at the 102 checkout desk within 5-7 business days. Thank you!

## 2015-04-30 DIAGNOSIS — Z0271 Encounter for disability determination: Secondary | ICD-10-CM

## 2015-04-30 NOTE — Telephone Encounter (Signed)
Completed and in FMLA box.

## 2015-05-02 NOTE — Telephone Encounter (Signed)
Paperwork scanned, faxed to company and also mailed to patients home address on 05/02/15

## 2015-05-14 ENCOUNTER — Encounter: Payer: Self-pay | Admitting: Vascular Surgery

## 2015-05-16 ENCOUNTER — Ambulatory Visit (HOSPITAL_COMMUNITY)
Admission: RE | Admit: 2015-05-16 | Discharge: 2015-05-16 | Disposition: A | Discharge status: Home / Self Care | Payer: 59 | Source: Ambulatory Visit | Attending: Vascular Surgery | Admitting: Vascular Surgery

## 2015-05-16 DIAGNOSIS — E785 Hyperlipidemia, unspecified: Secondary | ICD-10-CM | POA: Insufficient documentation

## 2015-05-16 DIAGNOSIS — T82510A Breakdown (mechanical) of surgically created arteriovenous fistula, initial encounter: Secondary | ICD-10-CM

## 2015-05-16 DIAGNOSIS — I129 Hypertensive chronic kidney disease with stage 1 through stage 4 chronic kidney disease, or unspecified chronic kidney disease: Secondary | ICD-10-CM | POA: Diagnosis not present

## 2015-05-16 DIAGNOSIS — Y832 Surgical operation with anastomosis, bypass or graft as the cause of abnormal reaction of the patient, or of later complication, without mention of misadventure at the time of the procedure: Secondary | ICD-10-CM | POA: Diagnosis not present

## 2015-05-16 DIAGNOSIS — E119 Type 2 diabetes mellitus without complications: Secondary | ICD-10-CM | POA: Diagnosis not present

## 2015-05-16 DIAGNOSIS — N183 Chronic kidney disease, stage 3 (moderate): Secondary | ICD-10-CM | POA: Insufficient documentation

## 2015-05-17 ENCOUNTER — Ambulatory Visit (INDEPENDENT_AMBULATORY_CARE_PROVIDER_SITE_OTHER): Payer: 59 | Admitting: Vascular Surgery

## 2015-05-17 ENCOUNTER — Other Ambulatory Visit: Payer: Self-pay

## 2015-05-17 ENCOUNTER — Encounter: Payer: Self-pay | Admitting: Vascular Surgery

## 2015-05-17 VITALS — BP 132/74 | HR 78 | Temp 98.6°F | Resp 20 | Ht 65.0 in | Wt 267.0 lb

## 2015-05-17 DIAGNOSIS — N186 End stage renal disease: Secondary | ICD-10-CM

## 2015-05-17 DIAGNOSIS — Z992 Dependence on renal dialysis: Secondary | ICD-10-CM

## 2015-05-17 NOTE — Progress Notes (Signed)
Patient name: Brenda Davis MRN: [id number] DOB: Aug 04, 1965 Sex: female  REASON FOR VISIT: AVF is difficult to cannulate. Referred by Dr. Marval Regal.  HPI: Brenda Davis is a 50 y.o. female who underwent a left brachial cephalic AV fistula on 35/32/9924. I last saw her on 02/20/2015. At that time the diameters of the fistula range from 0.53-0.81 cm. She states that she had an infiltrate the first time they used it. Since then they intermittently have problems dialyzing her. She dialyzes on Monday Wednesdays and Fridays. She denies any pain or paresthesias in her left upper extremity.  Current Outpatient Prescriptions  Medication Sig Dispense Refill  . allopurinol (ZYLOPRIM) 100 MG tablet Take 1 tablet (100 mg total) by mouth daily. 90 tablet 3  . amLODipine (NORVASC) 10 MG tablet Take 1 tablet (10 mg total) by mouth daily. 30 tablet 0  . benzonatate (TESSALON) 100 MG capsule Take 1-2 capsules (100-200 mg total) by mouth 3 (three) times daily as needed for cough. 40 capsule 0  . calcium acetate (PHOSLO) 667 MG capsule Take 2 capsules (1,334 mg total) by mouth 3 (three) times daily with meals. 180 capsule 0  . chlorpheniramine-HYDROcodone (TUSSIONEX PENNKINETIC ER) 10-8 MG/5ML SUER Take 5 mLs by mouth every 12 (twelve) hours as needed for cough. 140 mL 0  . colchicine 0.6 MG tablet Take 1 tablet (0.6 mg total) by mouth 2 (two) times daily. (Patient taking differently: Take 0.6 mg by mouth 2 (two) times daily as needed. ) 60 tablet 3  . docusate sodium (COLACE) 100 MG capsule Take 1 capsule (100 mg total) by mouth every 12 (twelve) hours. (Patient not taking: Reported on 04/15/2015) 60 capsule 0  . hydrOXYzine (ATARAX/VISTARIL) 25 MG tablet Take 1 tablet (25 mg total) by mouth 3 (three) times daily as needed for itching. (Patient not taking: Reported on 04/15/2015) 30 tablet 0  . irbesartan (AVAPRO) 300 MG tablet Take 1 tablet (300 mg total) by mouth at bedtime. 30 tablet 0  .  methylPREDNISolone (MEDROL DOSEPAK) 4 MG TBPK tablet TAKE AS DIRECTED 21 tablet 0  . multivitamin (RENA-VIT) TABS tablet Take 1 tablet by mouth at bedtime. 30 tablet 0  . nebivolol (BYSTOLIC) 5 MG tablet Take 1 tablet (5 mg total) by mouth 2 (two) times daily. 60 tablet 1  . oseltamivir (TAMIFLU) 30 MG capsule Take 1 capsule (30 mg total) by mouth daily. 3 capsule 0  . oxycodone (OXY-IR) 5 MG capsule Take 1 capsule (5 mg total) by mouth every 4 (four) hours as needed. (Patient not taking: Reported on 04/15/2015) 50 capsule 0   No current facility-administered medications for this visit.    REVIEW OF SYSTEMS:  [X]  denotes positive finding, [ ]  denotes negative finding Cardiac  Comments:  Chest pain or chest pressure:    Shortness of breath upon exertion:    Short of breath when lying flat:    Irregular heart rhythm:    Constitutional    Fever or chills:      PHYSICAL EXAM: There were no vitals filed for this visit.  GENERAL: The patient is a well-nourished female, in no acute distress. The vital signs are documented above. CARDIOVASCULAR: There is a regular rate and rhythm. PULMONARY: There is good air exchange bilaterally without wheezing or rales. Her left upper arm fistula has an excellent thrill and bruit. The vein feels fairly superficial and it seems like it would be fairly easy to cannulate. She has a palpable left radial pulse.  DUPLEX  AV FISTULA: I have independently interpreted the duplex of her AV fistula. The diameters of the fistula range from 0.61-0.92 cm. Depths range from 0.58-0.79 cm. There are some elevated velocities in the proximal fistula and also in the mid segment of the fistula. As one competing branch noted.  MEDICAL ISSUES:  DIFFICULT TO ACCESS LEFT BRACHIOCEPHALIC AV FISTULA: I recommended we obtain a fistulogram to further evaluate her left brachial cephalic AV fistula. If she has a stenosis amenable to angioplasty this could be addressed with venoplasty. We  can also evaluate for any significant competing branches. She dialyzes on Mondays Wednesdays and Fridays so we'll have to arrange this on a Tuesday or Thursday.  Deitra Mayo [facility] [place]: [phone]

## 2015-05-21 ENCOUNTER — Ambulatory Visit: Payer: 59

## 2015-05-23 ENCOUNTER — Other Ambulatory Visit: Payer: Self-pay | Admitting: *Deleted

## 2015-05-23 ENCOUNTER — Encounter (HOSPITAL_COMMUNITY): Payer: Self-pay | Admitting: *Deleted

## 2015-05-23 ENCOUNTER — Ambulatory Visit (HOSPITAL_COMMUNITY)
Admission: RE | Admit: 2015-05-23 | Discharge: 2015-05-23 | Disposition: A | Discharge status: Home / Self Care | Payer: 59 | Source: Ambulatory Visit | Attending: Vascular Surgery | Admitting: Vascular Surgery

## 2015-05-23 ENCOUNTER — Encounter (HOSPITAL_COMMUNITY)
Admission: RE | Disposition: A | Discharge status: Home / Self Care | Payer: Self-pay | Source: Ambulatory Visit | Attending: Vascular Surgery

## 2015-05-23 DIAGNOSIS — N186 End stage renal disease: Secondary | ICD-10-CM | POA: Diagnosis present

## 2015-05-23 DIAGNOSIS — Y832 Surgical operation with anastomosis, bypass or graft as the cause of abnormal reaction of the patient, or of later complication, without mention of misadventure at the time of the procedure: Secondary | ICD-10-CM | POA: Insufficient documentation

## 2015-05-23 DIAGNOSIS — T82898A Other specified complication of vascular prosthetic devices, implants and grafts, initial encounter: Secondary | ICD-10-CM | POA: Diagnosis not present

## 2015-05-23 DIAGNOSIS — T82858A Stenosis of vascular prosthetic devices, implants and grafts, initial encounter: Secondary | ICD-10-CM | POA: Diagnosis present

## 2015-05-23 DIAGNOSIS — Z992 Dependence on renal dialysis: Secondary | ICD-10-CM | POA: Diagnosis not present

## 2015-05-23 DIAGNOSIS — Z4931 Encounter for adequacy testing for hemodialysis: Secondary | ICD-10-CM

## 2015-05-23 HISTORY — PX: PERIPHERAL VASCULAR CATHETERIZATION: SHX172C

## 2015-05-23 LAB — POCT I-STAT, CHEM 8
BUN: 35 mg/dL — ABNORMAL HIGH (ref 6–20)
Calcium, Ion: 1.17 mmol/L (ref 1.12–1.23)
Chloride: 101 mmol/L (ref 101–111)
Creatinine, Ser: 7.1 mg/dL — ABNORMAL HIGH (ref 0.44–1.00)
Glucose, Bld: 100 mg/dL — ABNORMAL HIGH (ref 65–99)
HCT: 36 % (ref 36.0–46.0)
Hemoglobin: 12.2 g/dL (ref 12.0–15.0)
Potassium: 4.6 mmol/L (ref 3.5–5.1)
Sodium: 141 mmol/L (ref 135–145)
TCO2: 32 mmol/L (ref 0–100)

## 2015-05-23 SURGERY — A/V SHUNTOGRAM/FISTULAGRAM
Anesthesia: LOCAL | Laterality: Left

## 2015-05-23 MED ORDER — HEPARIN SODIUM (PORCINE) 1000 UNIT/ML IJ SOLN
INTRAMUSCULAR | Status: DC | PRN
  Administered 2015-05-23: 3000 [IU] via INTRAVENOUS

## 2015-05-23 MED ORDER — LIDOCAINE HCL (PF) 1 % IJ SOLN
INTRAMUSCULAR | Status: DC | PRN
  Administered 2015-05-23: 4 mL

## 2015-05-23 MED ORDER — IODIXANOL 320 MG/ML IV SOLN
INTRAVENOUS | Status: DC | PRN
  Administered 2015-05-23: 60 mL via INTRAVENOUS

## 2015-05-23 MED ORDER — MIDAZOLAM HCL 2 MG/2ML IJ SOLN
INTRAMUSCULAR | Status: AC
  Filled 2015-05-23: qty 2

## 2015-05-23 MED ORDER — MIDAZOLAM HCL 2 MG/2ML IJ SOLN
INTRAMUSCULAR | Status: DC | PRN
  Administered 2015-05-23: 0.5 mg via INTRAVENOUS

## 2015-05-23 MED ORDER — FENTANYL CITRATE (PF) 100 MCG/2ML IJ SOLN
INTRAMUSCULAR | Status: DC | PRN
  Administered 2015-05-23: 25 ug via INTRAVENOUS

## 2015-05-23 MED ORDER — SODIUM CHLORIDE 0.9% FLUSH: 3.0000 mL | INTRAVENOUS | Status: DC | PRN

## 2015-05-23 MED ORDER — SODIUM CHLORIDE 0.9 % IV SOLN: 250.0000 mL | INTRAVENOUS | Status: DC | PRN

## 2015-05-23 MED ORDER — FENTANYL CITRATE (PF) 100 MCG/2ML IJ SOLN
INTRAMUSCULAR | Status: AC
  Filled 2015-05-23: qty 2

## 2015-05-23 MED ORDER — ACETAMINOPHEN 325 MG PO TABS: 650.0000 mg | ORAL_TABLET | ORAL | Status: DC | PRN

## 2015-05-23 MED ORDER — HEPARIN (PORCINE) IN NACL 2-0.9 UNIT/ML-% IJ SOLN
INTRAMUSCULAR | Status: DC | PRN
  Administered 2015-05-23: 500 mL

## 2015-05-23 MED ORDER — HEPARIN SODIUM (PORCINE) 1000 UNIT/ML IJ SOLN
INTRAMUSCULAR | Status: AC
  Filled 2015-05-23: qty 1

## 2015-05-23 MED ORDER — HEPARIN (PORCINE) IN NACL 2-0.9 UNIT/ML-% IJ SOLN
INTRAMUSCULAR | Status: AC
  Filled 2015-05-23: qty 500

## 2015-05-23 MED ORDER — SODIUM CHLORIDE 0.9% FLUSH: 3.0000 mL | Freq: Two times a day (BID) | INTRAVENOUS | Status: DC

## 2015-05-23 MED ORDER — SODIUM CHLORIDE 0.9% FLUSH
3.0000 mL | INTRAVENOUS | Status: DC | PRN
Start: 2015-05-23 — End: 2015-05-23

## 2015-05-23 SURGICAL SUPPLY — 17 items
BAG SNAP BAND KOVER 36X36 (MISCELLANEOUS) ×2 IMPLANT
BALLN MUSTANG 6.0X40 75 (BALLOONS) ×2
BALLN MUSTANG 8.0X40 75 (BALLOONS) ×2
BALLOON MUSTANG 6.0X40 75 (BALLOONS) IMPLANT
BALLOON MUSTANG 8.0X40 75 (BALLOONS) IMPLANT
COVER DOME SNAP 22 D (MISCELLANEOUS) ×2 IMPLANT
COVER PRB 48X5XTLSCP FOLD TPE (BAG) ×1 IMPLANT
COVER PROBE 5X48 (BAG) ×4
KIT ENCORE 26 ADVANTAGE (KITS) ×1 IMPLANT
KIT MICROINTRODUCER STIFF 5F (SHEATH) ×1 IMPLANT
PROTECTION STATION PRESSURIZED (MISCELLANEOUS) ×2
SHEATH PINNACLE R/O II 6F 4CM (SHEATH) ×1 IMPLANT
STATION PROTECTION PRESSURIZED (MISCELLANEOUS) ×1 IMPLANT
STOPCOCK MORSE 400PSI 3WAY (MISCELLANEOUS) ×2 IMPLANT
TRAY PV CATH (CUSTOM PROCEDURE TRAY) ×2 IMPLANT
TUBING CIL FLEX 10 FLL-RA (TUBING) ×2 IMPLANT
WIRE BENTSON .035X145CM (WIRE) ×1 IMPLANT

## 2015-05-23 NOTE — Op Note (Addendum)
OPERATIVE NOTE   PROCEDURE: 1.  left brachiocephalic arteriovenous fistula cannulation under ultrasound guidance 2.  left arm fistulogram 3.  Venoplasty of cephalic vein x 2 (6 mm x 40 mm, 8 mm x 40 mm) 4.  Conscious sedation (28 minutes)  PRE-OPERATIVE DIAGNOSIS: Difficulty cannulation left brachiocephalic arteriovenous fistula  POST-OPERATIVE DIAGNOSIS: same as above   SURGEON: Adele Barthel, MD  ANESTHESIA: local  ESTIMATED BLOOD LOSS: 5 cc  FINDING(S): 1. Proximal cephalic vein stenosis (near confluence) >90%: <30% residual stenosis 2. Mid-segment cephalic vein stenosis, 09-47% stenosis with fixed valve: resolved after serial venoplasty 3. Collateral filling from proximal cephalic vein that drain into axillary/brachial vein system 4. Patent fistula throughout without central venous stenosis  SPECIMEN(S):  None  CONTRAST: 60 cc  INDICATIONS: Brenda Davis is a 50 y.o. female who presents with left brachiocephalic arteriovenous fistula with difficulty with cannulation.  Dr. Scot Dock recommended left arm fistulogram, possible intervention.  The patient is aware the risks include but are not limited to: bleeding, infection, thrombosis of the cannulated access, and possible anaphylactic reaction to the contrast.  The patient is aware of the risks of the procedure and elects to proceed forward.  DESCRIPTION: After full informed written consent was obtained, the patient was brought back to the angiography suite and placed supine upon the angiography table and connected to cardiopulmonary monitoring equipment.  The patient was then given conscious sedation, the amounts of which are documented in the patient's chart.  A circulating radiologic technician maintained continuous monitoring of the patient's cardiopulmonary status.  Additionally, the control room radiologic technician provided backup monitoring throughout the procedure.  The left upper arm was prepped and draped in the  standard fashion for a percutaneous access intervention.  Under ultrasound guidance, the left brachiocephalic arteriovenous fistula was cannulated with a micropuncture needle.  The microwire was advanced into the fistula and the needle was exchanged for the a microsheath, which was lodged 2 cm into the access.  The wire was removed and the sheath was connected to the IV extension tubing.  Hand injections were completed to image the access from the distal upper arm up to the level of chest.  Based on the images, this patient will need: venoplasty of the cephalic vein.  A Bentson wire was advanced into the axillary vein and the sheath was exchanged for a short 6-Fr sheath.  The patient was given 3000 units of Heparin intravenously.  Based on the imaging, a 6 mm x 40 mm angioplasty balloon was selected.  The balloon was centered around the proximal stenosis and inflated to 10 ATM for 2 minutes.  There was significant waist imaged during this process which appeared to resolve.  The patient had significant pain during this process.  I repeated this process on the mid-segment stenosis.  There was no waist on this inflation.  On completion imaging, the proximal stenosis was greatly improved with <30% residual stenosis present. The mid-segment stenosis did not appear any different.  At this point, the balloon was exchanged for a 8 mm x 40 mm angioplasty balloon.  The balloon was centered around the mid-segment stenosis and inflated to 10 ATM for 2 minutes.  There was significant waist on this inflation.  On completion imaging, the stenosis at the prior fixed valves were clearly resolved.  Based on the completion imaging, no further intervention is necessary.  The wire and balloon were removed from the sheath.  A 4-0 Monocryl purse-string suture was sewn around the  sheath.  The sheath was removed while tying down the suture.  A sterile bandage was applied to the puncture site.  COMPLICATIONS: none  CONDITION:  stable   Adele Barthel, MD [facility] Office: [phone] Pager: [phone]  05/23/2015 12:08 PM

## 2015-05-23 NOTE — H&P (View-Only) (Signed)
Patient name: Brenda Davis MRN: [id number] DOB: [birth year] Sex: female  REASON FOR VISIT: AVF is difficult to cannulate. Referred by Dr. Marval Regal.  HPI: Brenda Davis is a 50 y.o. female who underwent a left brachial cephalic AV fistula on 33/82/5053. I last saw her on 02/20/2015. At that time the diameters of the fistula range from 0.53-0.81 cm. She states that she had an infiltrate the first time they used it. Since then they intermittently have problems dialyzing her. She dialyzes on Monday Wednesdays and Fridays. She denies any pain or paresthesias in her left upper extremity.  Current Outpatient Prescriptions  Medication Sig Dispense Refill  . allopurinol (ZYLOPRIM) 100 MG tablet Take 1 tablet (100 mg total) by mouth daily. 90 tablet 3  . amLODipine (NORVASC) 10 MG tablet Take 1 tablet (10 mg total) by mouth daily. 30 tablet 0  . benzonatate (TESSALON) 100 MG capsule Take 1-2 capsules (100-200 mg total) by mouth 3 (three) times daily as needed for cough. 40 capsule 0  . calcium acetate (PHOSLO) 667 MG capsule Take 2 capsules (1,334 mg total) by mouth 3 (three) times daily with meals. 180 capsule 0  . chlorpheniramine-HYDROcodone (TUSSIONEX PENNKINETIC ER) 10-8 MG/5ML SUER Take 5 mLs by mouth every 12 (twelve) hours as needed for cough. 140 mL 0  . colchicine 0.6 MG tablet Take 1 tablet (0.6 mg total) by mouth 2 (two) times daily. (Patient taking differently: Take 0.6 mg by mouth 2 (two) times daily as needed. ) 60 tablet 3  . docusate sodium (COLACE) 100 MG capsule Take 1 capsule (100 mg total) by mouth every 12 (twelve) hours. (Patient not taking: Reported on 04/15/2015) 60 capsule 0  . hydrOXYzine (ATARAX/VISTARIL) 25 MG tablet Take 1 tablet (25 mg total) by mouth 3 (three) times daily as needed for itching. (Patient not taking: Reported on 04/15/2015) 30 tablet 0  . irbesartan (AVAPRO) 300 MG tablet Take 1 tablet (300 mg total) by mouth at bedtime. 30 tablet 0  .  methylPREDNISolone (MEDROL DOSEPAK) 4 MG TBPK tablet TAKE AS DIRECTED 21 tablet 0  . multivitamin (RENA-VIT) TABS tablet Take 1 tablet by mouth at bedtime. 30 tablet 0  . nebivolol (BYSTOLIC) 5 MG tablet Take 1 tablet (5 mg total) by mouth 2 (two) times daily. 60 tablet 1  . oseltamivir (TAMIFLU) 30 MG capsule Take 1 capsule (30 mg total) by mouth daily. 3 capsule 0  . oxycodone (OXY-IR) 5 MG capsule Take 1 capsule (5 mg total) by mouth every 4 (four) hours as needed. (Patient not taking: Reported on 04/15/2015) 50 capsule 0   No current facility-administered medications for this visit.    REVIEW OF SYSTEMS:  [X]  denotes positive finding, [ ]  denotes negative finding Cardiac  Comments:  Chest pain or chest pressure:    Shortness of breath upon exertion:    Short of breath when lying flat:    Irregular heart rhythm:    Constitutional    Fever or chills:      PHYSICAL EXAM: There were no vitals filed for this visit.  GENERAL: The patient is a well-nourished female, in no acute distress. The vital signs are documented above. CARDIOVASCULAR: There is a regular rate and rhythm. PULMONARY: There is good air exchange bilaterally without wheezing or rales. Her left upper arm fistula has an excellent thrill and bruit. The vein feels fairly superficial and it seems like it would be fairly easy to cannulate. She has a palpable left radial pulse.  DUPLEX  AV FISTULA: I have independently interpreted the duplex of her AV fistula. The diameters of the fistula range from 0.61-0.92 cm. Depths range from 0.58-0.79 cm. There are some elevated velocities in the proximal fistula and also in the mid segment of the fistula. As one competing branch noted.  MEDICAL ISSUES:  DIFFICULT TO ACCESS LEFT BRACHIOCEPHALIC AV FISTULA: I recommended we obtain a fistulogram to further evaluate her left brachial cephalic AV fistula. If she has a stenosis amenable to angioplasty this could be addressed with venoplasty. We  can also evaluate for any significant competing branches. She dialyzes on Mondays Wednesdays and Fridays so we'll have to arrange this on a Tuesday or Thursday.  Deitra Mayo [facility]: [phone]

## 2015-05-23 NOTE — Interval H&P Note (Signed)
[facility]  History and Physical Update  The patient was interviewed and re-examined.  The patient's previous History and Physical has been reviewed and is unchanged from Dr. Scot Dock consult.  There is no change in the plan of care: left arm fistulogram, possible intervention.   I discussed with the patient the nature of angiographic procedures, especially the limited patencies of any endovascular intervention.    The patient is aware of that the risks of an angiographic procedure include but are not limited to: bleeding, infection, access site complications, renal failure, embolization, rupture of vessel, dissection, arteriovenous fistula, possible need for emergent surgical intervention, possible need for surgical procedures to treat the patient's pathology, anaphylactic reaction to contrast, and stroke and death.    The patient is aware of the risks and agrees to proceed.   Adele Barthel, MD [facility] [place] Office: [phone] Pager: [phone]  05/23/2015, 8:00 AM

## 2015-05-23 NOTE — Discharge Instructions (Signed)
Fistulogram, Care After °Refer to this sheet in the next few weeks. These instructions provide you with information on caring for yourself after your procedure. Your health care provider may also give you more specific instructions. Your treatment has been planned according to current medical practices, but problems sometimes occur. Call your health care provider if you have any problems or questions after your procedure. °WHAT TO EXPECT AFTER THE PROCEDURE °After your procedure, it is typical to have the following: °· A small amount of discomfort in the area where the catheters were placed. °· A small amount of bruising around the fistula. °· Sleepiness and fatigue. °HOME CARE INSTRUCTIONS °· Rest at home for the day following your procedure. °· Do not drive or operate heavy machinery while taking pain medicine. °· Take medicines only as directed by your health care provider. °· Do not take baths, swim, or use a hot tub until your health care provider approves. You may shower 24 hours after the procedure or as directed by your health care provider. °· There are many different ways to close and cover an incision, including stitches, skin glue, and adhesive strips. Follow your health care provider's instructions on: °¨ Incision care. °¨ Bandage (dressing) changes and removal. °¨ Incision closure removal. °· Monitor your dialysis fistula carefully. °SEEK MEDICAL CARE IF: °· You have drainage, redness, swelling, or pain at your catheter site. °· You have a fever. °· You have chills. °SEEK IMMEDIATE MEDICAL CARE IF: °· You feel weak. °· You have trouble balancing. °· You have trouble moving your arms or legs. °· You have problems with your speech or vision. °· You can no longer feel a vibration or buzz when you put your fingers over your dialysis fistula. °· The limb that was used for the procedure: °¨ Swells. °¨ Is painful. °¨ Is cold. °¨ Is discolored, such as blue or pale white. °  °This information is not intended  to replace advice given to you by your health care provider. Make sure you discuss any questions you have with your health care provider. °  °Document Released: 05/22/2013 Document Reviewed: 05/22/2013 °Elsevier Interactive Patient Education ©2016 [employer] ° °

## 2015-05-28 ENCOUNTER — Telehealth: Payer: Self-pay | Admitting: Vascular Surgery

## 2015-05-28 NOTE — Telephone Encounter (Signed)
sched appt 8/16; lab at 1 and md at 1:45. Mailed appt letter to inform pt of appt.

## 2015-05-28 NOTE — Telephone Encounter (Signed)
-----   Message from Mena Goes, RN sent at 05/23/2015  1:02 PM EDT ----- Regarding: schedule   ----- Message -----    From: Conrad Millerton, MD    Sent: 05/23/2015  12:18 PM      To: [phone]  [name] [id number] 1965-05-25   PROCEDURE: 1.  left brachiocephalic arteriovenous fistula cannulation under ultrasound guidance 2.  left arm fistulogram 3.  Venoplasty of cephalic vein x 2 (6 mm x 40 mm, 8 mm x 40 mm) 4.  Conscious sedation (28 minutes)   Follow-up: Dr. Scot Dock in 3 months  Orders(s) for follow-up: L access duplex

## 2015-07-16 ENCOUNTER — Ambulatory Visit: Payer: 59 | Admitting: Internal Medicine

## 2015-07-16 DIAGNOSIS — Z0289 Encounter for other administrative examinations: Secondary | ICD-10-CM

## 2015-08-29 ENCOUNTER — Encounter: Payer: Self-pay | Admitting: Vascular Surgery

## 2015-09-04 ENCOUNTER — Encounter: Payer: Self-pay | Admitting: Vascular Surgery

## 2015-09-04 ENCOUNTER — Ambulatory Visit (HOSPITAL_COMMUNITY)
Admission: RE | Admit: 2015-09-04 | Discharge: 2015-09-04 | Disposition: A | Discharge status: Home / Self Care | Payer: 59 | Source: Ambulatory Visit | Attending: Vascular Surgery | Admitting: Vascular Surgery

## 2015-09-04 ENCOUNTER — Ambulatory Visit (INDEPENDENT_AMBULATORY_CARE_PROVIDER_SITE_OTHER): Payer: 59 | Admitting: Vascular Surgery

## 2015-09-04 VITALS — BP 116/68 | HR 75 | Ht 65.0 in | Wt 280.3 lb

## 2015-09-04 DIAGNOSIS — Z992 Dependence on renal dialysis: Secondary | ICD-10-CM | POA: Diagnosis not present

## 2015-09-04 DIAGNOSIS — N186 End stage renal disease: Secondary | ICD-10-CM

## 2015-09-04 DIAGNOSIS — Z4931 Encounter for adequacy testing for hemodialysis: Secondary | ICD-10-CM | POA: Diagnosis not present

## 2015-09-04 NOTE — Progress Notes (Signed)
[facility]  Patient name: Brenda Davis MRN: [id number] DOB: [birth year] Sex: female  REASON FOR VISIT: Follow-up  HPI: Brenda Davis is a 50 y.o. female who presents status post left arm fistulogram with venoplasty of the cephalic vein 2 by Dr. Bridgett Larsson on 05/23/2015. This was done secondary to difficulty with cannulation of her left brachiocephalic fistula. The patient dialyzes on Mondays, Wednesdays and Fridays.  Today, the patient reports some intermittent difficulties with cannulation of her fistula. She states that this is tech dependent. Some of the more experienced dialysis technicians are able to stick her without any issues. She says that her flow rates are normal. The patient has some paresthesias of her left hand during dialysis. She states that this is tolerable. She denies any paresthesias or pain in her left hand while not on dialysis.  She is not on any blood thinners.  Past Medical History:  Diagnosis Date  . Blood transfusion without reported diagnosis   . Chronic kidney disease    STAGE 3 CHRONIC KIDNEY DISEASE SECONDARY TO DIABETIC GLOMERULOSCLEROSIS AND UNCONTROLLED HYPERTENSION - PER OFFICE NOTES DR. Florene [facility]  . Depression   . Diabetes mellitus   . Gout   . Gout   . Headache(784.0)    CHRONIC H/A'S  . Hyperlipidemia   . Hypertension   . Morbid obesity ([place])   . Pain    LEFT SHOULDER PAIN - WAS SEEN AT AN URGENT CARE - GIVEN SLING FOR COMFORT AND TOLD ROM AS TOLERATED.    Family History  Problem Relation Age of Onset  . Hypertension Mother   . Diabetes Father   . Arthritis Other   . Hyperlipidemia Other   . Cancer Neg Hx   . Heart disease Neg Hx   . Kidney disease Neg Hx   . Stroke Neg Hx     SOCIAL HISTORY: Social History  Substance Use Topics  . Smoking status: Former Smoker    Years: 20.00    Types: Cigarettes  . Smokeless tobacco: Never Used     Comment: QUIT SMOKING ABOUT 2009  .  Alcohol use 0.6 oz/week    1 Glasses of wine per week    Allergies  Allergen Reactions  . Clonidine Derivatives Swelling  . Welchol [Colesevelam Hcl] Nausea Only    Current Outpatient Prescriptions  Medication Sig Dispense Refill  . allopurinol (ZYLOPRIM) 100 MG tablet Take 1 tablet (100 mg total) by mouth daily. (Patient taking differently: Take 100 mg by mouth daily as needed. For gout) 90 tablet 3  . amLODipine (NORVASC) 10 MG tablet Take 1 tablet (10 mg total) by mouth daily. 30 tablet 0  . benzonatate (TESSALON) 100 MG capsule Take 1-2 capsules (100-200 mg total) by mouth 3 (three) times daily as needed for cough. 40 capsule 0  . calcium acetate (PHOSLO) 667 MG capsule Take 2 capsules (1,334 mg total) by mouth 3 (three) times daily with meals. 180 capsule 0  . chlorpheniramine-HYDROcodone (TUSSIONEX PENNKINETIC ER) 10-8 MG/5ML SUER Take 5 mLs by mouth every 12 (twelve) hours as needed for cough. 140 mL 0  . colchicine 0.6 MG tablet Take 1 tablet (0.6 mg total) by mouth 2 (two) times daily. (Patient taking differently: Take 0.6 mg by mouth 2 (two) times daily as needed. ) 60 tablet 3  . docusate sodium (COLACE) 100 MG capsule Take 1 capsule (100 mg total) by mouth every 12 (twelve) hours. (Patient taking differently: Take 100 mg by  mouth 2 (two) times daily as needed for mild constipation. ) 60 capsule 0  . hydrOXYzine (ATARAX/VISTARIL) 25 MG tablet Take 1 tablet (25 mg total) by mouth 3 (three) times daily as needed for itching. 30 tablet 0  . irbesartan (AVAPRO) 300 MG tablet Take 1 tablet (300 mg total) by mouth at bedtime. 30 tablet 0  . methylPREDNISolone (MEDROL DOSEPAK) 4 MG TBPK tablet TAKE AS DIRECTED (Patient not taking: Reported on 05/17/2015) 21 tablet 0  . multivitamin (RENA-VIT) TABS tablet Take 1 tablet by mouth at bedtime. 30 tablet 0  . nebivolol (BYSTOLIC) 5 MG tablet Take 1 tablet (5 mg total) by mouth 2 (two) times daily. 60 tablet 1  . oseltamivir (TAMIFLU) 30 MG  capsule Take 1 capsule (30 mg total) by mouth daily. (Patient not taking: Reported on 05/17/2015) 3 capsule 0  . oxycodone (OXY-IR) 5 MG capsule Take 1 capsule (5 mg total) by mouth every 4 (four) hours as needed. (Patient taking differently: Take 5 mg by mouth every 4 (four) hours as needed for pain. ) 50 capsule 0   No current facility-administered medications for this visit.     REVIEW OF SYSTEMS:  [X]  denotes positive finding, [ ]  denotes negative finding Cardiac  Comments:  Chest pain or chest pressure:    Shortness of breath upon exertion:    Short of breath when lying flat:    Irregular heart rhythm:        Vascular    Pain in calf, thigh, or hip brought on by ambulation:    Pain in feet at night that wakes you up from your sleep:     Blood clot in your veins:    Leg swelling:         Pulmonary    Oxygen at home:    Productive cough:     Wheezing:         Neurologic    Sudden weakness in arms or legs:     Sudden numbness in arms or legs:     Sudden onset of difficulty speaking or slurred speech:    Temporary loss of vision in one eye:     Problems with dizziness:         Gastrointestinal    Blood in stool:     Vomited blood:         Genitourinary    Burning when urinating:     Blood in urine:        Psychiatric    Major depression:         Hematologic    Bleeding problems:    Problems with blood clotting too easily:        Skin    Rashes or ulcers:        Constitutional    Fever or chills:      PHYSICAL EXAM: There were no vitals filed for this visit.  GENERAL: The patient is a well-nourished female, in no acute distress. The vital signs are documented above. CARDIAC: There is a regular rate and rhythm.  VASCULAR: 2+ left radial pulse. Palpable thrill left upper arm fistula throughout. No aneurysmal changes seen. PULMONARY: There is good air exchange bilaterally without wheezing or rales. MUSCULOSKELETAL: There are no major deformities or  cyanosis. NEUROLOGIC: No focal weakness or paresthesias are detected. SKIN: There are no ulcers or rashes noted. PSYCHIATRIC: The patient has a normal affect.  DATA:  Left upper arm dialysis fistula duplex 09/04/2015  Increased velocities at branch at  mid upper arm with peak systolic velocity of 123456 cm/s  MEDICAL ISSUES: ESRD Intermittent difficulty with cannulation of left brachiocephalic fistula  Dr. Scot Dock reviewed the fistulogram images from May 2017. There are some large competing branches present. The patient states that she has no difficulty with cannulation of her fistula when more experienced technicians are sticking her. She denies any drop in her flow rates. Offered her ligation of competing branches and fistulogram with possible venoplasty in the operating room if she continues to experience difficulties versus observation of her fistula. Patient currently chooses observation. She knows to contact us if she needs the procedure performed. This will need to be scheduled in OR 16 on Tuesdays or Thursdays.     Virgina Jock, PA-C [facility]  I have interviewed the patient and examined the patient. I agree with the findings by the PA. Her flows have been good on dialysis and several of the nurses are able to cannulate it without any difficulty. I have explained that if this changes that I would recommend ligation of the competing branches that were seen on her previous fistulogram. I would perform this in room 16 and also perform a fistulogram to reevaluate the stenosis in the central portion of the fistula and also the stenosis in the cephalic vein centrally right before it enters the subclavian vein.  Gae Gallop, MD [phone]

## 2015-09-17 ENCOUNTER — Encounter: Payer: Self-pay | Admitting: Internal Medicine

## 2015-09-17 ENCOUNTER — Ambulatory Visit: Payer: 59 | Admitting: Internal Medicine

## 2015-09-17 ENCOUNTER — Ambulatory Visit (INDEPENDENT_AMBULATORY_CARE_PROVIDER_SITE_OTHER): Payer: 59 | Admitting: Internal Medicine

## 2015-09-17 ENCOUNTER — Other Ambulatory Visit (INDEPENDENT_AMBULATORY_CARE_PROVIDER_SITE_OTHER): Payer: 59

## 2015-09-17 VITALS — BP 110/60 | HR 66 | Temp 99.1°F | Ht 65.0 in | Wt 283.0 lb

## 2015-09-17 DIAGNOSIS — R5383 Other fatigue: Secondary | ICD-10-CM

## 2015-09-17 DIAGNOSIS — I10 Essential (primary) hypertension: Secondary | ICD-10-CM

## 2015-09-17 DIAGNOSIS — Z23 Encounter for immunization: Secondary | ICD-10-CM

## 2015-09-17 DIAGNOSIS — E519 Thiamine deficiency, unspecified: Secondary | ICD-10-CM

## 2015-09-17 DIAGNOSIS — D529 Folate deficiency anemia, unspecified: Secondary | ICD-10-CM

## 2015-09-17 DIAGNOSIS — D519 Vitamin B12 deficiency anemia, unspecified: Secondary | ICD-10-CM

## 2015-09-17 LAB — CBC WITH DIFFERENTIAL/PLATELET
Basophils Absolute: 0 10*3/uL (ref 0.0–0.1)
Basophils Relative: 0.4 % (ref 0.0–3.0)
Eosinophils Absolute: 1 10*3/uL — ABNORMAL HIGH (ref 0.0–0.7)
Eosinophils Relative: 11.8 % — ABNORMAL HIGH (ref 0.0–5.0)
HCT: 33.3 % — ABNORMAL LOW (ref 36.0–46.0)
Hemoglobin: 11.1 g/dL — ABNORMAL LOW (ref 12.0–15.0)
Lymphocytes Relative: 33.9 % (ref 12.0–46.0)
Lymphs Abs: 2.8 10*3/uL (ref 0.7–4.0)
MCHC: 33.5 g/dL (ref 30.0–36.0)
MCV: 96.8 fl (ref 78.0–100.0)
Monocytes Absolute: 0.9 10*3/uL (ref 0.1–1.0)
Monocytes Relative: 11 % (ref 3.0–12.0)
Neutro Abs: 3.5 10*3/uL (ref 1.4–7.7)
Neutrophils Relative %: 42.9 % — ABNORMAL LOW (ref 43.0–77.0)
Platelets: 360 10*3/uL (ref 150.0–400.0)
RBC: 3.43 Mil/uL — ABNORMAL LOW (ref 3.87–5.11)
RDW: 14.9 % (ref 11.5–15.5)
WBC: 8.1 10*3/uL (ref 4.0–10.5)

## 2015-09-17 LAB — FOLATE: Folate: 10.2 ng/mL (ref >5.9)

## 2015-09-17 LAB — VITAMIN B12: Vitamin B-12: 330 pg/mL (ref 211–911)

## 2015-09-17 NOTE — Progress Notes (Signed)
Subjective:  Patient ID: Brenda Davis, female    DOB: June 23, 1965  Age: 50 y.o. MRN: [id number]  CC: Fatigue and Hypertension   [facility] about [facility] - she experiences fatigue during and after HD, she sleep for several hours after HD to recover. At other times she feels well.  Outpatient Medications Prior to Visit  Medication Sig Dispense Refill  . allopurinol (ZYLOPRIM) 100 MG tablet Take 1 tablet (100 mg total) by mouth daily. (Patient taking differently: Take 100 mg by mouth daily as needed. For gout) 90 tablet 3  . amLODipine (NORVASC) 10 MG tablet Take 1 tablet (10 mg total) by mouth daily. 30 tablet 0  . calcium acetate (PHOSLO) 667 MG capsule Take 2 capsules (1,334 mg total) by mouth 3 (three) times daily with meals. 180 capsule 0  . colchicine 0.6 MG tablet Take 1 tablet (0.6 mg total) by mouth 2 (two) times daily. (Patient taking differently: Take 0.6 mg by mouth 2 (two) times daily as needed. ) 60 tablet 3  . docusate sodium (COLACE) 100 MG capsule Take 1 capsule (100 mg total) by mouth every 12 (twelve) hours. 60 capsule 0  . irbesartan (AVAPRO) 300 MG tablet Take 1 tablet (300 mg total) by mouth at bedtime. 30 tablet 0  . multivitamin (RENA-VIT) TABS tablet Take 1 tablet by mouth at bedtime. 30 tablet 0  . nebivolol (BYSTOLIC) 5 MG tablet Take 1 tablet (5 mg total) by mouth 2 (two) times daily. 60 tablet 1  . oxycodone (OXY-IR) 5 MG capsule Take 1 capsule (5 mg total) by mouth every 4 (four) hours as needed. (Patient taking differently: Take 5 mg by mouth every 4 (four) hours as needed for pain. ) 50 capsule 0  . benzonatate (TESSALON) 100 MG capsule Take 1-2 capsules (100-200 mg total) by mouth 3 (three) times daily as needed for cough. 40 capsule 0  . chlorpheniramine-HYDROcodone (TUSSIONEX PENNKINETIC ER) 10-8 MG/5ML SUER Take 5 mLs by mouth every 12 (twelve) hours as needed for cough. 140 mL 0  . hydrOXYzine  (ATARAX/VISTARIL) 25 MG tablet Take 1 tablet (25 mg total) by mouth 3 (three) times daily as needed for itching. 30 tablet 0  . methylPREDNISolone (MEDROL DOSEPAK) 4 MG TBPK tablet TAKE AS DIRECTED 21 tablet 0  . oseltamivir (TAMIFLU) 30 MG capsule Take 1 capsule (30 mg total) by mouth daily. 3 capsule 0   No facility-administered medications prior to visit.     ROS Review of Systems  Constitutional: Positive for fatigue and unexpected weight change (wt gain). Negative for activity change, appetite change, chills, diaphoresis and fever.  HENT: Negative.  Negative for trouble swallowing.   Eyes: Negative.  Negative for visual disturbance.  Respiratory: Negative.  Negative for cough, choking, chest tightness, shortness of breath and stridor.   Cardiovascular: Negative.  Negative for chest pain, palpitations and leg swelling.  Gastrointestinal: Negative.  Negative for abdominal pain, constipation, diarrhea, nausea and vomiting.  Endocrine: Negative.   Genitourinary: Negative.  Negative for difficulty urinating.  Musculoskeletal: Negative.  Negative for arthralgias, back pain, joint swelling and myalgias.  Skin: Negative.   Allergic/Immunologic: Negative.   Neurological: Negative.  Negative for dizziness, tremors, syncope, speech difficulty, weakness, light-headedness, numbness and headaches.  Hematological: Negative.  Negative for adenopathy. Does not bruise/bleed easily.  Psychiatric/Behavioral: Negative.     Objective:  BP 110/60 (BP Location: Right Arm, Patient Position: Sitting, Cuff Size: Large)   Pulse 66  Temp 99.1 F (37.3 C) (Oral)   Ht 5\' 5"  (1.651 m)   Wt 283 lb (128.4 kg)   SpO2 99%   BMI 47.09 kg/m   BP Readings from Last 3 Encounters:  09/17/15 110/60  09/04/15 116/68  05/23/15 (!) 150/94    Wt Readings from Last 3 Encounters:  09/17/15 283 lb (128.4 kg)  09/04/15 280 lb 4.8 oz (127.1 kg)  05/23/15 250 lb (113.4 kg)    Physical Exam  Constitutional: She is  oriented to person, place, and time. No distress.  HENT:  Mouth/Throat: Oropharynx is clear and moist. No oropharyngeal exudate.  Eyes: Conjunctivae are normal. Right eye exhibits no discharge. Left eye exhibits no discharge. No scleral icterus.  Neck: Normal range of motion. Neck supple. No JVD present. No tracheal deviation present. No thyromegaly present.  Cardiovascular: Normal rate, regular rhythm, normal heart sounds and intact distal pulses.  Exam reveals no gallop and no friction rub.   No murmur heard. EKG ---  Sinus  Rhythm  WITHIN NORMAL LIMITS  Pulmonary/Chest: Effort normal and breath sounds normal. No stridor. No respiratory distress. She has no wheezes. She has no rales. She exhibits no tenderness.  Abdominal: Soft. Bowel sounds are normal. She exhibits no distension and no mass. There is no tenderness. There is no rebound and no guarding.  Musculoskeletal: Normal range of motion. She exhibits no edema, tenderness or deformity.  Lymphadenopathy:    She has no cervical adenopathy.  Neurological: She is oriented to person, place, and time.  Skin: Skin is warm and dry. No rash noted. She is not diaphoretic. No erythema. No pallor.    Lab Results  Component Value Date   WBC 8.1 09/17/2015   HGB 11.1 (L) 09/17/2015   HCT 33.3 (L) 09/17/2015   PLT 360.0 09/17/2015   GLUCOSE 100 (H) 05/23/2015   CHOL 189 12/31/2014   TRIG 80.0 12/31/2014   HDL 50.40 12/31/2014   LDLDIRECT 161.6 11/28/2012   LDLCALC 123 (H) 12/31/2014   ALT 16 02/05/2015   AST 15 02/05/2015   NA 141 05/23/2015   K 4.6 05/23/2015   CL 101 05/23/2015   CREATININE 7.10 (H) 05/23/2015   BUN 35 (H) 05/23/2015   CO2 28 02/05/2015   TSH 2.72 09/17/2015   INR 1.13 01/03/2015   HGBA1C 5.2 01/03/2015   MICROALBUR 159.00 (H) 09/17/2009    No results found.  Assessment & Plan:   Jamiracle was seen today for fatigue and hypertension.  Diagnoses and all orders for this visit:  Other fatigue- Her EKG is  normal, thyroid function studies are all normal, I think her fatigue is related to hemodialysis and I have asked her to discuss this with her nephrologist. -     EKG 12-Lead -     Thyroid Panel With TSH; Future  Folate deficiency anemia-  folate level is normal now -     CBC with Differential/Platelet; Future -     Vitamin B12; Future -     Folate; Future  Thiamine deficiency- vitamin B1 level ordered but is been canceled, the lab will contact her to have it redrawn. -     Vitamin B1; Future  HYPERTENSION, BENIGN ESSENTIAL- her blood pressure is adequately well-controlled -     CBC with Differential/Platelet; Future  Need for influenza vaccination -     Flu Vaccine QUAD 36+ mos PF IM (Fluarix & Fluzone Quad PF)  Need for Tdap vaccination -     Tdap vaccine greater  than or equal to 7yo IM  B12 deficiency anemia- this may explain some of her symptoms, will start weekly B12 supplementation. -     Discontinue: Cyanocobalamin 500 MCG/0.1ML SOLN; Place 0.1 mLs (500 mcg total) into the nose once a week.   I have discontinued Ms. Blake Divine- Brockman's hydrOXYzine, chlorpheniramine-HYDROcodone, methylPREDNISolone, benzonatate, and oseltamivir. I am also having her maintain her allopurinol, amLODipine, calcium acetate, irbesartan, multivitamin, nebivolol, oxycodone, colchicine, docusate sodium, and ethyl chloride.  Meds ordered this encounter  Medications  . ethyl chloride spray    Sig: SRAY 1 SPRAY TO SKIN 3 TIMES A WEEK AS DIRECTED. SPRAY A SMALL AMOUNT JUST PRIOR TO NEEDLE INSERTION    Refill:  12  . DISCONTD: Cyanocobalamin 500 MCG/0.1ML SOLN    Sig: Place 0.1 mLs (500 mcg total) into the nose once a week.    Dispense:  2.3 mL    Refill:  11     Follow-up: Return in about 4 weeks (around 10/15/2015).  Scarlette Calico, MD

## 2015-09-17 NOTE — Progress Notes (Signed)
Pre visit review using our clinic review tool, if applicable. No additional management support is needed unless otherwise documented below in the visit note. 

## 2015-09-17 NOTE — Patient Instructions (Signed)
Fatigue  Fatigue is feeling tired all of the time, a lack of energy, or a lack of motivation. Occasional or mild fatigue is often a normal response to activity or life in general. However, long-lasting (chronic) or extreme fatigue may indicate an underlying medical condition.  HOME CARE INSTRUCTIONS   Watch your fatigue for any changes. The following actions may help to lessen any discomfort you are feeling:  · Talk to your health care provider about how much sleep you need each night. Try to get the required amount every night.  · Take medicines only as directed by your health care provider.  · Eat a healthy and nutritious diet. Ask your health care provider if you need help changing your diet.  · Drink enough fluid to keep your urine clear or pale yellow.  · Practice ways of relaxing, such as yoga, meditation, massage therapy, or acupuncture.  · Exercise regularly.    · Change situations that cause you stress. Try to keep your work and personal routine reasonable.  · Do not abuse illegal drugs.  · Limit alcohol intake to no more than 1 drink per day for nonpregnant women and 2 drinks per day for men. One drink equals 12 ounces of beer, 5 ounces of wine, or 1½ ounces of hard liquor.  · Take a multivitamin, if directed by your health care provider.  SEEK MEDICAL CARE IF:   · Your fatigue does not get better.  · You have a fever.    · You have unintentional weight loss or gain.  · You have headaches.    · You have difficulty:      Falling asleep.    Sleeping throughout the night.  · You feel angry, guilty, anxious, or sad.     · You are unable to have a bowel movement (constipation).    · You skin is dry.     · Your legs or another part of your body is swollen.    SEEK IMMEDIATE MEDICAL CARE IF:   · You feel confused.    · Your vision is blurry.  · You feel faint or pass out.    · You have a severe headache.    · You have severe abdominal, pelvic, or back pain.    · You have chest pain, shortness of breath, or an  irregular or fast heartbeat.    · You are unable to urinate or you urinate less than normal.    · You develop abnormal bleeding, such as bleeding from the rectum, vagina, nose, lungs, or nipples.  · You vomit blood.     · You have thoughts about harming yourself or committing suicide.    · You are worried that you might harm someone else.       This information is not intended to replace advice given to you by your health care provider. Make sure you discuss any questions you have with your health care provider.     Document Released: 11/02/2006 Document Revised: 01/26/2014 Document Reviewed: 05/09/2013  Elsevier Interactive Patient Education ©2016 [employer]

## 2015-09-18 ENCOUNTER — Encounter: Payer: Self-pay | Admitting: Internal Medicine

## 2015-09-18 LAB — THYROID PANEL WITH TSH
Free Thyroxine Index: 2.5 (ref 1.4–3.8)
T3 Uptake: 29 % (ref 22–35)
T4, Total: 8.6 ug/dL (ref 4.5–12.0)
TSH: 2.72 mIU/L

## 2015-09-18 MED ORDER — CYANOCOBALAMIN 500 MCG/0.1ML NA SOLN: 0.1000 mL | NASAL | 11 refills | Status: DC

## 2015-09-19 ENCOUNTER — Telehealth: Payer: Self-pay | Admitting: Internal Medicine

## 2015-09-19 DIAGNOSIS — D519 Vitamin B12 deficiency anemia, unspecified: Secondary | ICD-10-CM

## 2015-09-19 MED ORDER — CYANOCOBALAMIN 500 MCG/0.1ML NA SOLN: 0.1000 mL | NASAL | 11 refills | Status: DC

## 2015-09-19 NOTE — Telephone Encounter (Signed)
Please resend cyanocobalamin to pharmacy.  Did confirm we were sending to correct pharmacy.  Pharmacy has told patient twice they do not have script.

## 2015-09-19 NOTE — Telephone Encounter (Signed)
erx resent 

## 2015-09-20 LAB — VITAMIN B1

## 2015-10-31 ENCOUNTER — Ambulatory Visit: Payer: 59 | Admitting: Cardiovascular Disease

## 2015-11-25 NOTE — Progress Notes (Deleted)
Cardiology Office Note   Date:  11/25/2015   ID:  Brenda Davis, DOB 08-14-1965, MRN [id number]  PCP:  Scarlette Calico, MD  Cardiologist:   Skeet Latch, MD   No chief complaint on file.     History of Present Illness: Brenda Davis is a 50 y.o. female with hypertension, hyperlipidemia, diabetes, morbid obesity, and CKD who presents for an evaluation of palpitations.    Brenda Davis has been experiencing irregular palpitations during hemodialysis. Based on the notes it appears that the rate has been less than 100 bpm. Her hemodialysis session was stopped early on 10/14/15 due to symptomatic palpitations.    Past Medical History:  Diagnosis Date  . Blood transfusion without reported diagnosis   . Chronic kidney disease    STAGE 3 CHRONIC KIDNEY DISEASE SECONDARY TO DIABETIC GLOMERULOSCLEROSIS AND UNCONTROLLED HYPERTENSION - PER OFFICE NOTES DR. [facility]  . Depression   . Diabetes mellitus   . Gout   . Gout   . Headache(784.0)    CHRONIC H/A'S  . Hyperlipidemia   . Hypertension   . Morbid obesity ([place])   . Pain    LEFT SHOULDER PAIN - WAS SEEN AT AN URGENT CARE - GIVEN SLING FOR COMFORT AND TOLD ROM AS TOLERATED.    Past Surgical History:  Procedure Laterality Date  . AV FISTULA PLACEMENT Left 01/03/2015   Procedure: BRACHIAL CEPHALIC ARTERIOVENOUS  FISTULA CREATION LEFT ARM;  Surgeon: Angelia Mould, MD;  Location: [place];  Service: Vascular;  Laterality: Left;  . CARPAL TUNNEL RELEASE  2008  . [place]  . ESOPHAGOGASTRODUODENOSCOPY N/A 09/04/2012   Procedure: ESOPHAGOGASTRODUODENOSCOPY (EGD);  Surgeon: Shann Medal, MD;  Location: [facility];  Service: General;  Laterality: N/A;  PF  . ESOPHAGOGASTRODUODENOSCOPY (EGD) WITH ESOPHAGEAL DILATION N/A 09/29/2012   Procedure: ESOPHAGOGASTRODUODENOSCOPY (EGD) WITH ESOPHAGEAL DILATION;  Surgeon: Milus Banister, MD;  Location: [facility];   Service: Endoscopy;  Laterality: N/A;  . INSERTION OF DIALYSIS CATHETER N/A 01/03/2015   Procedure: INSERTION OF DIALYSIS CATHETER RIGHT INTERNAL JUGULAR;  Surgeon: Angelia Mould, MD;  Location: [facility];  Service: Vascular;  Laterality: N/A;  . LAPAROSCOPIC GASTRIC SLEEVE RESECTION N/A 07/19/2012   Procedure: LAPAROSCOPIC SLEEVE GASTRECTOMY with EGD;  Surgeon: Madilyn Hook, DO;  Location: [facility];  Service: General;  Laterality: N/A;  laparoscopic sleeve gastrectomy with EGD  . PERIPHERAL VASCULAR CATHETERIZATION Left 05/23/2015   Procedure: Nolon Stalls;  Surgeon: Conrad Hoven, MD;  Location: [facility];  Service: Cardiovascular;  Laterality: Left;  upper aRM  . PERIPHERAL VASCULAR CATHETERIZATION Left 05/23/2015   Procedure: Peripheral Vascular Balloon Angioplasty;  Surgeon: Conrad Cascade, MD;  Location: [place] [facility];  Service: Cardiovascular;  Laterality: Left;  av fistula  . UPPER GI ENDOSCOPY N/A 07/19/2012   Procedure: UPPER GI ENDOSCOPY;  Surgeon: Madilyn Hook, DO;  Location: [facility];  Service: General;  Laterality: N/A;     Current Outpatient Prescriptions  Medication Sig Dispense Refill  . allopurinol (ZYLOPRIM) 100 MG tablet Take 1 tablet (100 mg total) by mouth daily. (Patient taking differently: Take 100 mg by mouth daily as needed. For gout) 90 tablet 3  . amLODipine (NORVASC) 10 MG tablet Take 1 tablet (10 mg total) by mouth daily. 30 tablet 0  . calcium acetate (PHOSLO) 667 MG capsule Take 2 capsules (1,334 mg total) by mouth 3 (three) times daily with meals. 180 capsule 0  . colchicine 0.6  MG tablet Take 1 tablet (0.6 mg total) by mouth 2 (two) times daily. (Patient taking differently: Take 0.6 mg by mouth 2 (two) times daily as needed. ) 60 tablet 3  . Cyanocobalamin 500 MCG/0.1ML SOLN Place 0.1 mLs (500 mcg total) into the nose once a week. 2.3 mL 11  . docusate sodium (COLACE) 100 MG capsule Take 1 capsule (100 mg total) by mouth every 12 (twelve) hours. 60 capsule 0    . ethyl chloride spray SRAY 1 SPRAY TO SKIN 3 TIMES A WEEK AS DIRECTED. SPRAY A SMALL AMOUNT JUST PRIOR TO NEEDLE INSERTION  12  . irbesartan (AVAPRO) 300 MG tablet Take 1 tablet (300 mg total) by mouth at bedtime. 30 tablet 0  . multivitamin (RENA-VIT) TABS tablet Take 1 tablet by mouth at bedtime. 30 tablet 0  . nebivolol (BYSTOLIC) 5 MG tablet Take 1 tablet (5 mg total) by mouth 2 (two) times daily. 60 tablet 1  . oxycodone (OXY-IR) 5 MG capsule Take 1 capsule (5 mg total) by mouth every 4 (four) hours as needed. (Patient taking differently: Take 5 mg by mouth every 4 (four) hours as needed for pain. ) 50 capsule 0   No current facility-administered medications for this visit.     Allergies:   Clonidine derivatives and Welchol [colesevelam hcl]    Social History:  The patient  reports that she has quit smoking. Her smoking use included Cigarettes. She quit after 20.00 years of use. She has never used smokeless tobacco. She reports that she drinks about 0.6 oz of alcohol per week . She reports that she does not use drugs.   Family History:  The patient's ***family history includes Arthritis in her other; Diabetes in her father; Hyperlipidemia in her other; Hypertension in her mother.    ROS:  Please see the history of present illness.   Otherwise, review of systems are positive for {NONE DEFAULTED:18576::"none"}.   All other systems are reviewed and negative.    PHYSICAL EXAM: VS:  There were no vitals taken for this visit. , BMI There is no height or weight on file to calculate BMI. GENERAL:  Well appearing HEENT:  Pupils equal round and reactive, fundi not visualized, oral mucosa unremarkable NECK:  No jugular venous distention, waveform within normal limits, carotid upstroke brisk and symmetric, no bruits, no thyromegaly LYMPHATICS:  No cervical adenopathy LUNGS:  Clear to auscultation bilaterally HEART:  RRR.  PMI not displaced or sustained,S1 and S2 within normal limits, no S3, no  S4, no clicks, no rubs, *** murmurs ABD:  Flat, positive bowel sounds normal in frequency in pitch, no bruits, no rebound, no guarding, no midline pulsatile mass, no hepatomegaly, no splenomegaly EXT:  2 plus pulses throughout, no edema, no cyanosis no clubbing SKIN:  No rashes no nodules NEURO:  Cranial nerves II through XII grossly intact, motor grossly intact throughout PSYCH:  Cognitively intact, oriented to person place and time    EKG:  EKG {ACTION; IS/IS [id number]} ordered today. The ekg ordered today demonstrates ***   Recent Labs: 12/31/2014: Pro B Natriuretic peptide (BNP) 477.0 01/05/2015: Magnesium 1.9 02/05/2015: ALT 16 05/23/2015: BUN 35; Creatinine, Ser 7.10; Potassium 4.6; Sodium 141 09/17/2015: Hemoglobin 11.1; Platelets 360.0; TSH 2.72    Lipid Panel    Component Value Date/Time   CHOL 189 12/31/2014 1510   TRIG 80.0 12/31/2014 1510   HDL 50.40 12/31/2014 1510   CHOLHDL 4 12/31/2014 1510   VLDL 16.0 12/31/2014 1510   LDLCALC 123 (  H) 12/31/2014 1510   LDLDIRECT 161.6 11/28/2012 1042     Date/23/17: WBC 6.9, hematocrit 30.8, platelets 324 Magnesium 2.1 Sodium 140, potassium 5.7, BUN 51, creatinine 9. serum  Wt Readings from Last 3 Encounters:  09/17/15 128.4 kg (283 lb)  09/04/15 127.1 kg (280 lb 4.8 oz)  05/23/15 113.4 kg (250 lb)      ASSESSMENT AND PLAN:  ***   Current medicines are reviewed at length with the patient today.  The patient {ACTIONS; HAS/DOES NOT HAVE:19233} concerns regarding medicines.  The following changes have been made:  {PLAN; NO CHANGE:13088:s}  Labs/ tests ordered today include: *** No orders of the defined types were placed in this encounter.    Disposition:   FU with ***    This note was written with the assistance of speech recognition software.  Please excuse any transcriptional errors.  Signed, Cliffie Gingras C. Oval Linsey, MD, [facility]  11/25/2015 10:44 PM    [facility]

## 2015-11-26 ENCOUNTER — Ambulatory Visit: Payer: 59 | Admitting: Cardiovascular Disease

## 2015-11-30 ENCOUNTER — Ambulatory Visit: Payer: 59 | Admitting: Family Medicine

## 2015-12-05 ENCOUNTER — Ambulatory Visit (INDEPENDENT_AMBULATORY_CARE_PROVIDER_SITE_OTHER)
Admission: RE | Admit: 2015-12-05 | Discharge: 2015-12-05 | Disposition: A | Discharge status: Home / Self Care | Payer: 59 | Source: Ambulatory Visit | Attending: Internal Medicine | Admitting: Internal Medicine

## 2015-12-05 ENCOUNTER — Encounter: Payer: Self-pay | Admitting: Internal Medicine

## 2015-12-05 ENCOUNTER — Other Ambulatory Visit (INDEPENDENT_AMBULATORY_CARE_PROVIDER_SITE_OTHER): Payer: 59

## 2015-12-05 ENCOUNTER — Ambulatory Visit (INDEPENDENT_AMBULATORY_CARE_PROVIDER_SITE_OTHER): Payer: 59 | Admitting: Internal Medicine

## 2015-12-05 VITALS — BP 136/86 | HR 72 | Temp 98.0°F | Resp 16 | Ht 65.0 in | Wt 286.2 lb

## 2015-12-05 DIAGNOSIS — E519 Thiamine deficiency, unspecified: Secondary | ICD-10-CM

## 2015-12-05 DIAGNOSIS — I10 Essential (primary) hypertension: Secondary | ICD-10-CM | POA: Diagnosis not present

## 2015-12-05 DIAGNOSIS — M1030 Gout due to renal impairment, unspecified site: Secondary | ICD-10-CM

## 2015-12-05 DIAGNOSIS — D513 Other dietary vitamin B12 deficiency anemia: Secondary | ICD-10-CM

## 2015-12-05 DIAGNOSIS — R05 Cough: Secondary | ICD-10-CM

## 2015-12-05 DIAGNOSIS — E785 Hyperlipidemia, unspecified: Secondary | ICD-10-CM

## 2015-12-05 DIAGNOSIS — Z0000 Encounter for general adult medical examination without abnormal findings: Secondary | ICD-10-CM

## 2015-12-05 DIAGNOSIS — Z452 Encounter for adjustment and management of vascular access device: Secondary | ICD-10-CM | POA: Insufficient documentation

## 2015-12-05 DIAGNOSIS — R059 Cough, unspecified: Secondary | ICD-10-CM

## 2015-12-05 DIAGNOSIS — Z124 Encounter for screening for malignant neoplasm of cervix: Secondary | ICD-10-CM

## 2015-12-05 DIAGNOSIS — Z1231 Encounter for screening mammogram for malignant neoplasm of breast: Secondary | ICD-10-CM

## 2015-12-05 LAB — LIPID PANEL
Cholesterol: 201 mg/dL — ABNORMAL HIGH (ref 0–200)
HDL: 58.1 mg/dL (ref >39.00)
LDL Cholesterol: 125 mg/dL — ABNORMAL HIGH (ref 0–99)
NonHDL: 142.46
Total CHOL/HDL Ratio: 3
Triglycerides: 86 mg/dL (ref 0.0–149.0)
VLDL: 17.2 mg/dL (ref 0.0–40.0)

## 2015-12-05 LAB — CBC WITH DIFFERENTIAL/PLATELET
Basophils Absolute: 0 10*3/uL (ref 0.0–0.1)
Basophils Relative: 0.3 % (ref 0.0–3.0)
Eosinophils Absolute: 1 10*3/uL — ABNORMAL HIGH (ref 0.0–0.7)
Eosinophils Relative: 11.7 % — ABNORMAL HIGH (ref 0.0–5.0)
HCT: 36.6 % (ref 36.0–46.0)
Hemoglobin: 12.1 g/dL (ref 12.0–15.0)
Lymphocytes Relative: 29.1 % (ref 12.0–46.0)
Lymphs Abs: 2.6 10*3/uL (ref 0.7–4.0)
MCHC: 33 g/dL (ref 30.0–36.0)
MCV: 97.4 fl (ref 78.0–100.0)
Monocytes Absolute: 0.9 10*3/uL (ref 0.1–1.0)
Monocytes Relative: 10 % (ref 3.0–12.0)
Neutro Abs: 4.3 10*3/uL (ref 1.4–7.7)
Neutrophils Relative %: 48.9 % (ref 43.0–77.0)
Platelets: 352 10*3/uL (ref 150.0–400.0)
RBC: 3.76 Mil/uL — ABNORMAL LOW (ref 3.87–5.11)
RDW: 16.3 % — ABNORMAL HIGH (ref 11.5–15.5)
WBC: 8.8 10*3/uL (ref 4.0–10.5)

## 2015-12-05 LAB — VITAMIN B12: Vitamin B-12: 424 pg/mL (ref 211–911)

## 2015-12-05 LAB — FOLATE: Folate: 15.8 ng/mL (ref >5.9)

## 2015-12-05 LAB — URIC ACID: Uric Acid, Serum: 4.9 mg/dL (ref 2.4–7.0)

## 2015-12-05 NOTE — Progress Notes (Signed)
Subjective:  Patient ID: Brenda Davis, female    DOB: 03-16-1965  Age: 50 y.o. MRN: [id number]  CC: Cough; Annual Exam; and Anemia   HPI Brenda Davis presents for a CPX.  She complains of nonproductive cough for about 2-3 weeks that is finally improving. She denies hemoptysis, chest pain, shortness of breath, night sweats, fever, chills, or weight loss. She has a remote history of tobacco abuse.  She tells me her blood pressure has been well controlled. She has had no recent episodes of palpitations, edema, or fatigue. She is on hemodialysis.  She has stopped taking allopurinol and has had no recent episodes of gouty arthropathy.  Outpatient Medications Prior to Visit  Medication Sig Dispense Refill  . amLODipine (NORVASC) 10 MG tablet Take 1 tablet (10 mg total) by mouth daily. 30 tablet 0  . calcium acetate (PHOSLO) 667 MG capsule Take 2 capsules (1,334 mg total) by mouth 3 (three) times daily with meals. 180 capsule 0  . colchicine 0.6 MG tablet Take 1 tablet (0.6 mg total) by mouth 2 (two) times daily. (Patient taking differently: Take 0.6 mg by mouth 2 (two) times daily as needed. ) 60 tablet 3  . Cyanocobalamin 500 MCG/0.1ML SOLN Place 0.1 mLs (500 mcg total) into the nose once a week. 2.3 mL 11  . docusate sodium (COLACE) 100 MG capsule Take 1 capsule (100 mg total) by mouth every 12 (twelve) hours. 60 capsule 0  . ethyl chloride spray SRAY 1 SPRAY TO SKIN 3 TIMES A WEEK AS DIRECTED. SPRAY A SMALL AMOUNT JUST PRIOR TO NEEDLE INSERTION  12  . irbesartan (AVAPRO) 300 MG tablet Take 1 tablet (300 mg total) by mouth at bedtime. 30 tablet 0  . multivitamin (RENA-VIT) TABS tablet Take 1 tablet by mouth at bedtime. 30 tablet 0  . nebivolol (BYSTOLIC) 5 MG tablet Take 1 tablet (5 mg total) by mouth 2 (two) times daily. 60 tablet 1  . oxycodone (OXY-IR) 5 MG capsule Take 1 capsule (5 mg total) by mouth every 4 (four) hours as needed. (Patient taking differently:  Take 5 mg by mouth every 4 (four) hours as needed for pain. ) 50 capsule 0  . allopurinol (ZYLOPRIM) 100 MG tablet Take 1 tablet (100 mg total) by mouth daily. (Patient taking differently: Take 100 mg by mouth daily as needed. For gout) 90 tablet 3   No facility-administered medications prior to visit.     ROS Review of Systems  Constitutional: Negative for activity change, appetite change, chills, diaphoresis, fatigue, fever and unexpected weight change.  HENT: Negative.  Negative for trouble swallowing.   Eyes: Negative.  Negative for visual disturbance.  Respiratory: Positive for cough. Negative for apnea, choking, chest tightness, shortness of breath, wheezing and stridor.   Cardiovascular: Negative.  Negative for chest pain, palpitations and leg swelling.  Gastrointestinal: Negative.  Negative for abdominal pain, blood in stool, constipation, diarrhea, nausea and vomiting.  Endocrine: Negative.   Genitourinary: Negative.  Negative for difficulty urinating.  Musculoskeletal: Negative.  Negative for arthralgias, back pain, joint swelling, myalgias and neck pain.  Skin: Negative.   Allergic/Immunologic: Negative.   Neurological: Negative.  Negative for dizziness, weakness, light-headedness and numbness.  Hematological: Negative.  Negative for adenopathy. Does not bruise/bleed easily.  Psychiatric/Behavioral: Negative.     Objective:  BP 136/86 (BP Location: Right Arm, Patient Position: Sitting, Cuff Size: Large)   Pulse 72   Temp 98 F (36.7 C) (Oral)   Resp 16  Ht 5\' 5"  (1.651 m)   Wt 286 lb 3 oz (129.8 kg)   SpO2 100%   BMI 47.62 kg/m   BP Readings from Last 3 Encounters:  12/05/15 136/86  09/17/15 110/60  09/04/15 116/68    Wt Readings from Last 3 Encounters:  12/05/15 286 lb 3 oz (129.8 kg)  09/17/15 283 lb (128.4 kg)  09/04/15 280 lb 4.8 oz (127.1 kg)    Physical Exam  Constitutional: She is oriented to person, place, and time. She appears well-developed and  well-nourished. No distress.  HENT:  Head: Normocephalic and atraumatic.  Mouth/Throat: Oropharynx is clear and moist. No oropharyngeal exudate.  Eyes: Conjunctivae are normal. Right eye exhibits no discharge. Left eye exhibits no discharge. No scleral icterus.  Neck: Normal range of motion. Neck supple. No JVD present. No tracheal deviation present. No thyromegaly present.  Cardiovascular: Normal rate, regular rhythm, normal heart sounds and intact distal pulses.  Exam reveals no gallop and no friction rub.   No murmur heard. Pulmonary/Chest: Effort normal and breath sounds normal. No stridor. No respiratory distress. She has no wheezes. She has no rales. She exhibits no tenderness.  Abdominal: Soft. Bowel sounds are normal. She exhibits no distension and no mass. There is no tenderness. There is no rebound and no guarding.  Musculoskeletal: Normal range of motion. She exhibits no edema, tenderness or deformity.  Lymphadenopathy:    She has no cervical adenopathy.  Neurological: She is oriented to person, place, and time.  Skin: Skin is warm and dry. No rash noted. She is not diaphoretic. No erythema. No pallor.  Psychiatric: She has a normal mood and affect. Her behavior is normal. Judgment and thought content normal.  Vitals reviewed.   Lab Results  Component Value Date   WBC 8.8 12/05/2015   HGB 12.1 12/05/2015   HCT 36.6 12/05/2015   PLT 352.0 12/05/2015   GLUCOSE 100 (H) 05/23/2015   CHOL 201 (H) 12/05/2015   TRIG 86.0 12/05/2015   HDL 58.10 12/05/2015   LDLDIRECT 161.6 11/28/2012   LDLCALC 125 (H) 12/05/2015   ALT 16 02/05/2015   AST 15 02/05/2015   NA 141 05/23/2015   K 4.6 05/23/2015   CL 101 05/23/2015   CREATININE 7.10 (H) 05/23/2015   BUN 35 (H) 05/23/2015   CO2 28 02/05/2015   TSH 2.72 09/17/2015   INR 1.13 01/03/2015   HGBA1C 5.2 01/03/2015   MICROALBUR 159.00 (H) 09/17/2009    No results found.  Assessment & Plan:   Nhu was seen today for cough,  annual exam and anemia.  Diagnoses and all orders for this visit:  Gout due to renal impairment, unspecified chronicity, unspecified site- her uric acid level is less than 6 on no medications, I do not think she needs to restart allopurinol. -     Uric acid; Future  Thiamine deficiency- Improvement noted -     Vitamin B1; Future -     CBC with Differential/Platelet; Future  Other dietary vitamin B12 deficiency anemia- improvement noted, will continue weekly B12 nasal spray. -     Vitamin B12; Future -     Folate; Future -     CBC with Differential/Platelet; Future  HYPERTENSION, BENIGN ESSENTIAL- her blood pressure is adequately well-controlled.  Hyperlipidemia with target LDL less than 100- her Framingham risk score is not high enough to indicate statin therapy. -     Lipid panel; Future  Cough- her exam and chest x-ray are normal, this is a post  viral URI that is resolving rapidly. -     DG Chest 2 View; Future  Routine general medical examination at a health care facility- exam completed, labs ordered and reviewed, vaccines reviewed and updated, she is referred for a Pap smear, mammogram, and I have ordered a Cologuard screen for colon cancer, patient education material was given.  Cervical cancer screening -     Ambulatory referral to Gynecology  Visit for screening mammogram -     MM DIGITAL SCREENING BILATERAL; Future   I have discontinued Ms. Aretha Parrot allopurinol. I am also having her maintain her amLODipine, calcium acetate, irbesartan, multivitamin, nebivolol, oxycodone, colchicine, docusate sodium, ethyl chloride, and Cyanocobalamin.  No orders of the defined types were placed in this encounter.    Follow-up: Return in about 3 months (around 03/06/2016).  Scarlette Calico, MD

## 2015-12-05 NOTE — Patient Instructions (Signed)
Health Maintenance, Female Introduction Adopting a healthy lifestyle and getting preventive care can go a long way to promote health and wellness. Talk with your health care provider about what schedule of regular examinations is right for you. This is a good chance for you to check in with your provider about disease prevention and staying healthy. In between checkups, there are plenty of things you can do on your own. Experts have done a lot of research about which lifestyle changes and preventive measures are most likely to keep you healthy. Ask your health care provider for more information. Weight and diet Eat a healthy diet  Be sure to include plenty of vegetables, fruits, low-fat dairy products, and lean protein.  Do not eat a lot of foods high in solid fats, added sugars, or salt.  Get regular exercise. This is one of the most important things you can do for your health.  Most adults should exercise for at least 150 minutes each week. The exercise should increase your heart rate and make you sweat (moderate-intensity exercise).  Most adults should also do strengthening exercises at least twice a week. This is in addition to the moderate-intensity exercise. Maintain a healthy weight  Body mass index (BMI) is a measurement that can be used to identify possible weight problems. It estimates body fat based on height and weight. Your health care provider can help determine your BMI and help you achieve or maintain a healthy weight.  For females 51 years of age and older:  A BMI below 18.5 is considered underweight.  A BMI of 18.5 to 24.9 is normal.  A BMI of 25 to 29.9 is considered overweight.  A BMI of 30 and above is considered obese. Watch levels of cholesterol and blood lipids  You should start having your blood tested for lipids and cholesterol at 51 years of age, then have this test every 5 years.  You may need to have your cholesterol levels checked more often if:  Your  lipid or cholesterol levels are high.  You are older than 51 years of age.  You are at high risk for heart disease. Cancer screening Lung Cancer  Lung cancer screening is recommended for adults 51-22 years old who are at high risk for lung cancer because of a history of smoking.  A yearly low-dose CT scan of the lungs is recommended for people who:  Currently smoke.  Have quit within the past 15 years.  Have at least a 30-pack-year history of smoking. A pack year is smoking an average of one pack of cigarettes a day for 1 year.  Yearly screening should continue until it has been 15 years since you quit.  Yearly screening should stop if you develop a health problem that would prevent you from having lung cancer treatment. Breast Cancer  Practice breast self-awareness. This means understanding how your breasts normally appear and feel.  It also means doing regular breast self-exams. Let your health care provider know about any changes, no matter how small.  If you are in your 20s or 30s, you should have a clinical breast exam (CBE) by a health care provider every 1-3 years as part of a regular health exam.  If you are 35 or older, have a CBE every year. Also consider having a breast X-ray (mammogram) every year.  If you have a family history of breast cancer, talk to your health care provider about genetic screening.  If you are at high risk for breast cancer,  talk to your health care provider about having an MRI and a mammogram every year.  Breast cancer gene (BRCA) assessment is recommended for women who have family members with BRCA-related cancers. BRCA-related cancers include:  Breast.  Ovarian.  Tubal.  Peritoneal cancers.  Results of the assessment will determine the need for genetic counseling and BRCA1 and BRCA2 testing. Cervical Cancer  Your health care provider may recommend that you be screened regularly for cancer of the pelvic organs (ovaries, uterus, and  vagina). This screening involves a pelvic examination, including checking for microscopic changes to the surface of your cervix (Pap test). You may be encouraged to have this screening done every 3 years, beginning at age 21.  For women ages 30-65, health care providers may recommend pelvic exams and Pap testing every 3 years, or they may recommend the Pap and pelvic exam, combined with testing for human papilloma virus (HPV), every 5 years. Some types of HPV increase your risk of cervical cancer. Testing for HPV may also be done on women of any age with unclear Pap test results.  Other health care providers may not recommend any screening for nonpregnant women who are considered low risk for pelvic cancer and who do not have symptoms. Ask your health care provider if a screening pelvic exam is right for you.  If you have had past treatment for cervical cancer or a condition that could lead to cancer, you need Pap tests and screening for cancer for at least 20 years after your treatment. If Pap tests have been discontinued, your risk factors (such as having a new sexual partner) need to be reassessed to determine if screening should resume. Some women have medical problems that increase the chance of getting cervical cancer. In these cases, your health care provider may recommend more frequent screening and Pap tests. Colorectal Cancer  This type of cancer can be detected and often prevented.  Routine colorectal cancer screening usually begins at 50 years of age and continues through 51 years of age.  Your health care provider may recommend screening at an earlier age if you have risk factors for colon cancer.  Your health care provider may also recommend using home test kits to check for hidden blood in the stool.  A small camera at the end of a tube can be used to examine your colon directly (sigmoidoscopy or colonoscopy). This is done to check for the earliest forms of colorectal  cancer.  Routine screening usually begins at age 50.  Direct examination of the colon should be repeated every 5-10 years through 51 years of age. However, you may need to be screened more often if early forms of precancerous polyps or small growths are found. Skin Cancer  Check your skin from head to toe regularly.  Tell your health care provider about any new moles or changes in moles, especially if there is a change in a mole's shape or color.  Also tell your health care provider if you have a mole that is larger than the size of a pencil eraser.  Always use sunscreen. Apply sunscreen liberally and repeatedly throughout the day.  Protect yourself by wearing long sleeves, pants, a wide-brimmed hat, and sunglasses whenever you are outside. Heart disease, diabetes, and high blood pressure  High blood pressure causes heart disease and increases the risk of stroke. High blood pressure is more likely to develop in:  People who have blood pressure in the high end of the normal range (130-139/85-89 mm Hg).    People who are overweight or obese.  People who are African American.  If you are 18-39 years of age, have your blood pressure checked every 3-5 years. If you are 40 years of age or older, have your blood pressure checked every year. You should have your blood pressure measured twice-once when you are at a hospital or clinic, and once when you are not at a hospital or clinic. Record the average of the two measurements. To check your blood pressure when you are not at a hospital or clinic, you can use:  An automated blood pressure machine at a pharmacy.  A home blood pressure monitor.  If you are between 55 years and 79 years old, ask your health care provider if you should take aspirin to prevent strokes.  Have regular diabetes screenings. This involves taking a blood sample to check your fasting blood sugar level.  If you are at a normal weight and have a low risk for diabetes,  have this test once every three years after 51 years of age.  If you are overweight and have a high risk for diabetes, consider being tested at a younger age or more often. Preventing infection Hepatitis B  If you have a higher risk for hepatitis B, you should be screened for this virus. You are considered at high risk for hepatitis B if:  You were born in a country where hepatitis B is common. Ask your health care provider which countries are considered high risk.  Your parents were born in a high-risk country, and you have not been immunized against hepatitis B (hepatitis B vaccine).  You have HIV or AIDS.  You use needles to inject street drugs.  You live with someone who has hepatitis B.  You have had sex with someone who has hepatitis B.  You get hemodialysis treatment.  You take certain medicines for conditions, including cancer, organ transplantation, and autoimmune conditions. Hepatitis C  Blood testing is recommended for:  Everyone born from 1945 through 1965.  Anyone with known risk factors for hepatitis C. Sexually transmitted infections (STIs)  You should be screened for sexually transmitted infections (STIs) including gonorrhea and chlamydia if:  You are sexually active and are younger than 51 years of age.  You are older than 51 years of age and your health care provider tells you that you are at risk for this type of infection.  Your sexual activity has changed since you were last screened and you are at an increased risk for chlamydia or gonorrhea. Ask your health care provider if you are at risk.  If you do not have HIV, but are at risk, it may be recommended that you take a prescription medicine daily to prevent HIV infection. This is called pre-exposure prophylaxis (PrEP). You are considered at risk if:  You are sexually active and do not regularly use condoms or know the HIV status of your partner(s).  You take drugs by injection.  You are sexually  active with a partner who has HIV. Talk with your health care provider about whether you are at high risk of being infected with HIV. If you choose to begin PrEP, you should first be tested for HIV. You should then be tested every 3 months for as long as you are taking PrEP. Pregnancy  If you are premenopausal and you may become pregnant, ask your health care provider about preconception counseling.  If you may become pregnant, take 400 to 800 micrograms (mcg) of folic acid   every day.  If you want to prevent pregnancy, talk to your health care provider about birth control (contraception). Osteoporosis and menopause  Osteoporosis is a disease in which the bones lose minerals and strength with aging. This can result in serious bone fractures. Your risk for osteoporosis can be identified using a bone density scan.  If you are 75 years of age or older, or if you are at risk for osteoporosis and fractures, ask your health care provider if you should be screened.  Ask your health care provider whether you should take a calcium or vitamin D supplement to lower your risk for osteoporosis.  Menopause may have certain physical symptoms and risks.  Hormone replacement therapy may reduce some of these symptoms and risks. Talk to your health care provider about whether hormone replacement therapy is right for you. Follow these instructions at home:  Schedule regular health, dental, and eye exams.  Stay current with your immunizations.  Do not use any tobacco products including cigarettes, chewing tobacco, or electronic cigarettes.  If you are pregnant, do not drink alcohol.  If you are breastfeeding, limit how much and how often you drink alcohol.  Limit alcohol intake to no more than 1 drink per day for nonpregnant women. One drink equals 12 ounces of beer, 5 ounces of wine, or 1 ounces of hard liquor.  Do not use street drugs.  Do not share needles.  Ask your health care provider for  help if you need support or information about quitting drugs.  Tell your health care provider if you often feel depressed.  Tell your health care provider if you have ever been abused or do not feel safe at home. This information is not intended to replace advice given to you by your health care provider. Make sure you discuss any questions you have with your health care provider. Document Released: 07/21/2010 Document Revised: 06/13/2015 Document Reviewed: 10/09/2014  2017 [employer]

## 2015-12-05 NOTE — Progress Notes (Signed)
Pre visit review using our clinic review tool, if applicable. No additional management support is needed unless otherwise documented below in the visit note. 

## 2015-12-08 LAB — VITAMIN B1: Vitamin B1 (Thiamine): 15 nmol/L (ref 8–30)

## 2015-12-10 ENCOUNTER — Telehealth: Payer: Self-pay

## 2015-12-10 NOTE — Telephone Encounter (Signed)
Order [id number]

## 2015-12-16 ENCOUNTER — Telehealth: Payer: Self-pay | Admitting: Cardiovascular Disease

## 2015-12-16 NOTE — Progress Notes (Deleted)
Cardiology Office Note   Date:  12/16/2015   ID:  Brenda Davis, DOB [birth year], MRN [id number]  PCP:  Scarlette Calico, MD  Cardiologist:   Skeet Latch, MD   No chief complaint on file.     History of Present Illness: Brenda Davis is a 50 y.o. female with hypertension, hyperlipidemia, diabetes, morbid obesity, and CKD who presents for an evaluation of palpitations.    Brenda Davis has been experiencing irregular palpitations during hemodialysis. Based on Brenda notes it appears that Brenda rate has been less than 100 bpm. Her hemodialysis session was stopped early on 10/14/15 due to symptomatic palpitations.    Past Medical History:  Diagnosis Date  . Blood transfusion without reported diagnosis   . Chronic kidney disease    STAGE 3 CHRONIC KIDNEY DISEASE SECONDARY TO DIABETIC GLOMERULOSCLEROSIS AND UNCONTROLLED HYPERTENSION - PER OFFICE NOTES DR[facility]  . Depression   . Diabetes mellitus   . Gout   . Gout   . Headache(784.0)    CHRONIC H/A'S  . Hyperlipidemia   . Hypertension   . Morbid obesity ([place])   . Pain    LEFT SHOULDER PAIN - WAS SEEN AT AN URGENT CARE - GIVEN SLING FOR COMFORT AND TOLD ROM AS TOLERATED.    Past Surgical History:  Procedure Laterality Date  . AV FISTULA PLACEMENT Left 01/03/2015   Procedure: BRACHIAL CEPHALIC ARTERIOVENOUS  FISTULA CREATION LEFT ARM;  Surgeon: Angelia Mould, MD;  Location: [place];  Service: Vascular;  Laterality: Left;  . CARPAL TUNNEL RELEASE  2008  . [place]  . ESOPHAGOGASTRODUODENOSCOPY N/A 09/04/2012   Procedure: ESOPHAGOGASTRODUODENOSCOPY (EGD);  Surgeon: Shann Medal, MD;  Location[facility];  Service: General;  Laterality: N/A;  PF  . ESOPHAGOGASTRODUODENOSCOPY (EGD) WITH ESOPHAGEAL DILATION N/A 09/29/2012   Procedure: ESOPHAGOGASTRODUODENOSCOPY (EGD) WITH ESOPHAGEAL DILATION;  Surgeon: Milus Banister, MD;  [facility];   Service: Endoscopy;  Laterality: N/A;  . INSERTION OF DIALYSIS CATHETER N/A 01/03/2015   Procedure: INSERTION OF DIALYSIS CATHETER RIGHT INTERNAL JUGULAR;  Surgeon: Angelia Mould, MD;  Location: [place];  Service: Vascular;  Laterality: N/A;  . LAPAROSCOPIC GASTRIC SLEEVE RESECTION N/A 07/19/2012   Procedure: LAPAROSCOPIC SLEEVE GASTRECTOMY with EGD;  Surgeon: Madilyn Hook, DO;  [facility];  Service: General;  Laterality: N/A;  laparoscopic sleeve gastrectomy with EGD  . PERIPHERAL VASCULAR CATHETERIZATION Left 05/23/2015   Procedure: Nolon Stalls;  Surgeon: Conrad [place], MD;  Location: [place] [facility];  Service: Cardiovascular;  Laterality: Left;  upper aRM  . PERIPHERAL VASCULAR CATHETERIZATION Left 05/23/2015   Procedure: Peripheral Vascular Balloon Angioplasty;  Surgeon: Conrad Maddock, MD;  Location: [place] [facility];  Service: Cardiovascular;  Laterality: Left;  av fistula  . UPPER GI ENDOSCOPY N/A 07/19/2012   Procedure: UPPER GI ENDOSCOPY;  Surgeon: Madilyn Hook, DO;  [facility];  Service: General;  Laterality: N/A;     Current Outpatient Prescriptions  Medication Sig Dispense Refill  . amLODipine (NORVASC) 10 MG tablet Take 1 tablet (10 mg total) by mouth daily. 30 tablet 0  . calcium acetate (PHOSLO) 667 MG capsule Take 2 capsules (1,334 mg total) by mouth 3 (three) times daily with meals. 180 capsule 0  . colchicine 0.6 MG tablet Take 1 tablet (0.6 mg total) by mouth 2 (two) times daily. (Davis taking differently: Take 0.6 mg by mouth 2 (two) times daily as needed. ) 60 tablet 3  .  Cyanocobalamin 500 MCG/0.1ML SOLN Place 0.1 mLs (500 mcg total) into Brenda nose once a week. 2.3 mL 11  . docusate sodium (COLACE) 100 MG capsule Take 1 capsule (100 mg total) by mouth every 12 (twelve) hours. 60 capsule 0  . ethyl chloride spray SRAY 1 SPRAY TO SKIN 3 TIMES A WEEK AS DIRECTED. SPRAY A SMALL AMOUNT JUST PRIOR TO NEEDLE INSERTION  12  . multivitamin (RENA-VIT) TABS tablet Take  1 tablet by mouth at bedtime. 30 tablet 0  . nebivolol (BYSTOLIC) 5 MG tablet Take 1 tablet (5 mg total) by mouth 2 (two) times daily. 60 tablet 1  . oxycodone (OXY-IR) 5 MG capsule Take 1 capsule (5 mg total) by mouth every 4 (four) hours as needed. (Davis taking differently: Take 5 mg by mouth every 4 (four) hours as needed for pain. ) 50 capsule 0   No current facility-administered medications for this visit.     Allergies:   Clonidine derivatives and Welchol [colesevelam hcl]    Social History:  Brenda Davis  reports that she has quit smoking. Her smoking use included Cigarettes. She quit after 20.00 years of use. She has never used smokeless tobacco. She reports that she drinks about 0.6 oz of alcohol per week . She reports that she does not use drugs.   Family History:  Brenda Davis's ***family history includes Arthritis in her other; Diabetes in her father; Hyperlipidemia in her other; Hypertension in her mother.    ROS:  Please see Brenda history of present illness.   Otherwise, review of systems are positive for {NONE DEFAULTED:18576::"none"}.   All other systems are reviewed and negative.    PHYSICAL EXAM: VS:  There were no vitals taken for this visit. , BMI There is no height or weight on file to calculate BMI. GENERAL:  Well appearing HEENT:  Pupils equal round and reactive, fundi not visualized, oral mucosa unremarkable NECK:  No jugular venous distention, waveform within normal limits, carotid upstroke brisk and symmetric, no bruits, no thyromegaly LYMPHATICS:  No cervical adenopathy LUNGS:  Clear to auscultation bilaterally HEART:  RRR.  PMI not displaced or sustained,S1 and S2 within normal limits, no S3, no S4, no clicks, no rubs, *** murmurs ABD:  Flat, positive bowel sounds normal in frequency in pitch, no bruits, no rebound, no guarding, no midline pulsatile mass, no hepatomegaly, no splenomegaly EXT:  2 plus pulses throughout, no edema, no cyanosis no clubbing SKIN:  No  rashes no nodules NEURO:  Cranial nerves II through XII grossly intact, motor grossly intact throughout PSYCH:  Cognitively intact, oriented to person place and time    EKG:  EKG {ACTION; IS/IS QIH:[id number]} ordered today. Brenda ekg ordered today demonstrates ***   Recent Labs: 12/31/2014: Pro B Natriuretic peptide (BNP) 477.0 01/05/2015: Magnesium 1.9 02/05/2015: ALT 16 05/23/2015: BUN 35; Creatinine, Ser 7.10; Potassium 4.6; Sodium 141 09/17/2015: TSH 2.72 12/05/2015: Hemoglobin 12.1; Platelets 352.0    Lipid Panel    Component Value Date/Time   CHOL 201 (H) 12/05/2015 1000   TRIG 86.0 12/05/2015 1000   HDL 58.10 12/05/2015 1000   CHOLHDL 3 12/05/2015 1000   VLDL 17.2 12/05/2015 1000   LDLCALC 125 (H) 12/05/2015 1000   LDLDIRECT 161.6 11/28/2012 1042     Date/23/17: WBC 6.9, hematocrit 30.8, platelets 324 Magnesium 2.1 Sodium 140, potassium 5.7, BUN 51, creatinine 9. serum  Wt Readings from Last 3 Encounters:  12/05/15 129.8 kg (286 lb 3 oz)  09/17/15 128.4 kg (283 lb)  09/04/15 127.1 kg (280 lb 4.8 oz)      ASSESSMENT AND PLAN:  ***   Current medicines are reviewed at length with Brenda Davis today.  Brenda Davis {ACTIONS; HAS/DOES NOT HAVE:19233} concerns regarding medicines.  Brenda following changes have been made:  {PLAN; NO CHANGE:13088:s}  Labs/ tests ordered today include: *** No orders of Brenda defined types were placed in this encounter.    Disposition:   FU with ***    This note was written with Brenda assistance of speech recognition software.  Please excuse any transcriptional errors.  Signed, Sanaia Jasso C. Oval Linsey, MD, [facility]  12/16/2015 12:20 PM    [facility]

## 2015-12-16 NOTE — Telephone Encounter (Signed)
Received records from [facility] for appointment on 12/17/15 with Dr Oval Linsey.  Records given to [facility] (medical records) for Dr Blenda Mounts schedule on 12/17/15. lp

## 2015-12-17 ENCOUNTER — Ambulatory Visit: Payer: 59 | Admitting: Cardiovascular Disease

## 2016-01-24 NOTE — Telephone Encounter (Signed)
delivered

## 2016-02-17 ENCOUNTER — Encounter (HOSPITAL_COMMUNITY): Payer: Self-pay

## 2016-02-20 ENCOUNTER — Ambulatory Visit: Payer: 59

## 2016-02-27 NOTE — Telephone Encounter (Signed)
02/08/2016: Cancelled - Suspended for Inactivity

## 2016-03-02 ENCOUNTER — Encounter: Payer: Self-pay | Admitting: Internal Medicine

## 2016-03-18 ENCOUNTER — Other Ambulatory Visit (HOSPITAL_COMMUNITY): Payer: Self-pay | Admitting: Nephrology

## 2016-03-18 ENCOUNTER — Ambulatory Visit (HOSPITAL_COMMUNITY)
Admission: RE | Admit: 2016-03-18 | Discharge: 2016-03-18 | Disposition: A | Discharge status: Home / Self Care | Payer: 59 | Source: Ambulatory Visit | Attending: Vascular Surgery | Admitting: Vascular Surgery

## 2016-03-18 DIAGNOSIS — M7989 Other specified soft tissue disorders: Secondary | ICD-10-CM | POA: Insufficient documentation

## 2016-03-22 ENCOUNTER — Other Ambulatory Visit: Payer: Self-pay | Admitting: Internal Medicine

## 2016-03-23 NOTE — Telephone Encounter (Signed)
Routing to dr Ralph Leyden rx was sent by dr Irine Seal in 2016---are you ok with refilling this med---please advise, thanks

## 2016-04-09 ENCOUNTER — Ambulatory Visit: Payer: 59 | Admitting: Nurse Practitioner

## 2016-04-09 DIAGNOSIS — Z0289 Encounter for other administrative examinations: Secondary | ICD-10-CM

## 2016-04-22 ENCOUNTER — Telehealth: Payer: Self-pay | Admitting: Internal Medicine

## 2016-04-22 NOTE — Telephone Encounter (Signed)
Received records from [place] for appointment on 05/05/16 with Dr Debara Pickett. Records put with Dr Lysbeth Penner schedule for 05/05/16. lp

## 2016-05-04 ENCOUNTER — Other Ambulatory Visit: Payer: Self-pay | Admitting: Nephrology

## 2016-05-04 ENCOUNTER — Ambulatory Visit
Admission: RE | Admit: 2016-05-04 | Discharge: 2016-05-04 | Disposition: A | Discharge status: Home / Self Care | Payer: 59 | Source: Ambulatory Visit | Attending: Nephrology | Admitting: Nephrology

## 2016-05-04 DIAGNOSIS — R059 Cough, unspecified: Secondary | ICD-10-CM

## 2016-05-04 DIAGNOSIS — R05 Cough: Secondary | ICD-10-CM

## 2016-05-05 ENCOUNTER — Encounter: Payer: Self-pay | Admitting: *Deleted

## 2016-05-05 ENCOUNTER — Ambulatory Visit: Payer: 59 | Admitting: Internal Medicine

## 2016-05-06 ENCOUNTER — Encounter (HOSPITAL_COMMUNITY): Payer: Self-pay | Admitting: *Deleted

## 2016-05-06 ENCOUNTER — Inpatient Hospital Stay (HOSPITAL_COMMUNITY)
Admission: EM | Admit: 2016-05-06 | Discharge: 2016-05-09 | DRG: 193 | Disposition: A | Discharge status: Home / Self Care | Payer: 59 | Attending: Internal Medicine | Admitting: Internal Medicine

## 2016-05-06 ENCOUNTER — Emergency Department (HOSPITAL_COMMUNITY): Payer: 59

## 2016-05-06 DIAGNOSIS — D631 Anemia in chronic kidney disease: Secondary | ICD-10-CM | POA: Diagnosis present

## 2016-05-06 DIAGNOSIS — N189 Chronic kidney disease, unspecified: Secondary | ICD-10-CM

## 2016-05-06 DIAGNOSIS — R059 Cough, unspecified: Secondary | ICD-10-CM

## 2016-05-06 DIAGNOSIS — J851 Abscess of lung with pneumonia: Secondary | ICD-10-CM | POA: Diagnosis present

## 2016-05-06 DIAGNOSIS — R0600 Dyspnea, unspecified: Secondary | ICD-10-CM

## 2016-05-06 DIAGNOSIS — Z992 Dependence on renal dialysis: Secondary | ICD-10-CM | POA: Diagnosis not present

## 2016-05-06 DIAGNOSIS — N186 End stage renal disease: Secondary | ICD-10-CM

## 2016-05-06 DIAGNOSIS — R509 Fever, unspecified: Secondary | ICD-10-CM

## 2016-05-06 DIAGNOSIS — B9781 Human metapneumovirus as the cause of diseases classified elsewhere: Secondary | ICD-10-CM | POA: Diagnosis present

## 2016-05-06 DIAGNOSIS — J9601 Acute respiratory failure with hypoxia: Secondary | ICD-10-CM | POA: Diagnosis present

## 2016-05-06 DIAGNOSIS — E662 Morbid (severe) obesity with alveolar hypoventilation: Secondary | ICD-10-CM | POA: Diagnosis present

## 2016-05-06 DIAGNOSIS — J189 Pneumonia, unspecified organism: Secondary | ICD-10-CM | POA: Diagnosis present

## 2016-05-06 DIAGNOSIS — J129 Viral pneumonia, unspecified: Principal | ICD-10-CM | POA: Diagnosis present

## 2016-05-06 DIAGNOSIS — I120 Hypertensive chronic kidney disease with stage 5 chronic kidney disease or end stage renal disease: Secondary | ICD-10-CM | POA: Diagnosis present

## 2016-05-06 DIAGNOSIS — Y95 Nosocomial condition: Secondary | ICD-10-CM | POA: Diagnosis present

## 2016-05-06 DIAGNOSIS — R609 Edema, unspecified: Secondary | ICD-10-CM

## 2016-05-06 DIAGNOSIS — Z79899 Other long term (current) drug therapy: Secondary | ICD-10-CM

## 2016-05-06 DIAGNOSIS — I10 Essential (primary) hypertension: Secondary | ICD-10-CM

## 2016-05-06 DIAGNOSIS — Z888 Allergy status to other drugs, medicaments and biological substances status: Secondary | ICD-10-CM

## 2016-05-06 DIAGNOSIS — Z6841 Body Mass Index (BMI) 40.0 and over, adult: Secondary | ICD-10-CM | POA: Diagnosis not present

## 2016-05-06 DIAGNOSIS — B348 Other viral infections of unspecified site: Secondary | ICD-10-CM | POA: Diagnosis present

## 2016-05-06 DIAGNOSIS — J9602 Acute respiratory failure with hypercapnia: Secondary | ICD-10-CM | POA: Diagnosis present

## 2016-05-06 DIAGNOSIS — E8770 Fluid overload, unspecified: Secondary | ICD-10-CM | POA: Diagnosis present

## 2016-05-06 DIAGNOSIS — D638 Anemia in other chronic diseases classified elsewhere: Secondary | ICD-10-CM | POA: Diagnosis present

## 2016-05-06 DIAGNOSIS — E785 Hyperlipidemia, unspecified: Secondary | ICD-10-CM | POA: Diagnosis present

## 2016-05-06 DIAGNOSIS — Z09 Encounter for follow-up examination after completed treatment for conditions other than malignant neoplasm: Secondary | ICD-10-CM

## 2016-05-06 DIAGNOSIS — Z87891 Personal history of nicotine dependence: Secondary | ICD-10-CM

## 2016-05-06 DIAGNOSIS — R05 Cough: Secondary | ICD-10-CM

## 2016-05-06 HISTORY — DX: End stage renal disease: N18.6

## 2016-05-06 HISTORY — DX: Type 2 diabetes mellitus without complications: E11.9

## 2016-05-06 HISTORY — DX: Pneumonia, unspecified organism: J18.9

## 2016-05-06 HISTORY — DX: Dependence on renal dialysis: Z99.2

## 2016-05-06 HISTORY — DX: Personal history of other medical treatment: Z92.89

## 2016-05-06 HISTORY — DX: Anemia, unspecified: D64.9

## 2016-05-06 LAB — CBC WITH DIFFERENTIAL/PLATELET
Basophils Absolute: 0 10*3/uL (ref 0.0–0.1)
Basophils Relative: 0 %
Eosinophils Absolute: 0.2 10*3/uL (ref 0.0–0.7)
Eosinophils Relative: 3 %
HCT: 32.2 % — ABNORMAL LOW (ref 36.0–46.0)
Hemoglobin: 10.2 g/dL — ABNORMAL LOW (ref 12.0–15.0)
Lymphocytes Relative: 28 %
Lymphs Abs: 1.9 10*3/uL (ref 0.7–4.0)
MCH: 30.5 pg (ref 26.0–34.0)
MCHC: 31.7 g/dL (ref 30.0–36.0)
MCV: 96.4 fL (ref 78.0–100.0)
Monocytes Absolute: 0.5 10*3/uL (ref 0.1–1.0)
Monocytes Relative: 7 %
Neutro Abs: 4.2 10*3/uL (ref 1.7–7.7)
Neutrophils Relative %: 62 %
Platelets: 248 10*3/uL (ref 150–400)
RBC: 3.34 MIL/uL — ABNORMAL LOW (ref 3.87–5.11)
RDW: 14.5 % (ref 11.5–15.5)
WBC: 6.8 10*3/uL (ref 4.0–10.5)

## 2016-05-06 LAB — RESPIRATORY PANEL BY PCR

## 2016-05-06 LAB — URINALYSIS, ROUTINE W REFLEX MICROSCOPIC
Bacteria, UA: NONE SEEN
Bilirubin Urine: NEGATIVE
Glucose, UA: 50 mg/dL — AB
Hgb urine dipstick: NEGATIVE
Ketones, ur: NEGATIVE mg/dL
Leukocytes, UA: NEGATIVE
Nitrite: NEGATIVE
Protein, ur: >=300 mg/dL — AB
Specific Gravity, Urine: 1.015 (ref 1.005–1.030)
pH: 7 (ref 5.0–8.0)

## 2016-05-06 LAB — COMPREHENSIVE METABOLIC PANEL
ALT: 34 U/L (ref 14–54)
AST: 34 U/L (ref 15–41)
Albumin: 3.6 g/dL (ref 3.5–5.0)
Alkaline Phosphatase: 80 U/L (ref 38–126)
Anion gap: 14 (ref 5–15)
BUN: 38 mg/dL — ABNORMAL HIGH (ref 6–20)
CO2: 27 mmol/L (ref 22–32)
Calcium: 8.9 mg/dL (ref 8.9–10.3)
Chloride: 97 mmol/L — ABNORMAL LOW (ref 101–111)
Creatinine, Ser: 10.65 mg/dL — ABNORMAL HIGH (ref 0.44–1.00)
GFR calc Af Amer: 4 mL/min — ABNORMAL LOW (ref >60)
GFR calc non Af Amer: 4 mL/min — ABNORMAL LOW (ref >60)
Glucose, Bld: 112 mg/dL — ABNORMAL HIGH (ref 65–99)
Potassium: 5 mmol/L (ref 3.5–5.1)
Sodium: 138 mmol/L (ref 135–145)
Total Bilirubin: 0.8 mg/dL (ref 0.3–1.2)
Total Protein: 7.4 g/dL (ref 6.5–8.1)

## 2016-05-06 LAB — PHOSPHORUS: Phosphorus: 6.8 mg/dL — ABNORMAL HIGH (ref 2.5–4.6)

## 2016-05-06 LAB — STREP PNEUMONIAE URINARY ANTIGEN: Strep Pneumo Urinary Antigen: NEGATIVE

## 2016-05-06 LAB — I-STAT CG4 LACTIC ACID, ED
Lactic Acid, Venous: 1.57 mmol/L (ref 0.5–1.9)
Lactic Acid, Venous: 0.8 mmol/L (ref 0.5–1.9)

## 2016-05-06 LAB — PROTIME-INR
INR: 1
Prothrombin Time: 13.2 seconds (ref 11.4–15.2)

## 2016-05-06 LAB — MAGNESIUM: Magnesium: 2.3 mg/dL (ref 1.7–2.4)

## 2016-05-06 MED ORDER — IPRATROPIUM-ALBUTEROL 0.5-2.5 (3) MG/3ML IN SOLN: 3.0000 mL | RESPIRATORY_TRACT | Status: DC | PRN

## 2016-05-06 MED ORDER — VANCOMYCIN HCL 10 G IV SOLR
2500.0000 mg | Freq: Once | INTRAVENOUS | Status: DC
  Filled 2016-05-06 (×2): qty 2500

## 2016-05-06 MED ORDER — ACETAMINOPHEN 650 MG RE SUPP: 650.0000 mg | Freq: Four times a day (QID) | RECTAL | Status: DC | PRN

## 2016-05-06 MED ORDER — CALCIUM ACETATE (PHOS BINDER) 667 MG PO CAPS
1334.0000 mg | ORAL_CAPSULE | Freq: Three times a day (TID) | ORAL | Status: DC
  Administered 2016-05-06 – 2016-05-09 (×5): 1334 mg via ORAL
  Filled 2016-05-06 (×6): qty 2

## 2016-05-06 MED ORDER — DEXTROSE 5 % IV SOLN: 1.0000 g | Freq: Three times a day (TID) | INTRAVENOUS | Status: DC

## 2016-05-06 MED ORDER — CAMPHOR-MENTHOL 0.5-0.5 % EX LOTN
TOPICAL_LOTION | CUTANEOUS | Status: DC | PRN
  Administered 2016-05-06: 23:00:00 via TOPICAL
  Filled 2016-05-06: qty 222

## 2016-05-06 MED ORDER — ONDANSETRON HCL 4 MG/2ML IJ SOLN: 4.0000 mg | Freq: Four times a day (QID) | INTRAMUSCULAR | Status: DC | PRN

## 2016-05-06 MED ORDER — LIDOCAINE HCL (PF) 1 % IJ SOLN
5.0000 mL | INTRAMUSCULAR | Status: DC | PRN
  Filled 2016-05-06: qty 5

## 2016-05-06 MED ORDER — AMLODIPINE BESYLATE 10 MG PO TABS
10.0000 mg | ORAL_TABLET | Freq: Every day | ORAL | Status: DC
  Administered 2016-05-07 – 2016-05-09 (×2): 10 mg via ORAL
  Filled 2016-05-06 (×3): qty 1

## 2016-05-06 MED ORDER — ALTEPLASE 2 MG IJ SOLR: 2.0000 mg | Freq: Once | INTRAMUSCULAR | Status: DC | PRN

## 2016-05-06 MED ORDER — SODIUM CHLORIDE 0.9 % IV SOLN: 100.0000 mL | INTRAVENOUS | Status: DC | PRN

## 2016-05-06 MED ORDER — IRBESARTAN 300 MG PO TABS
300.0000 mg | ORAL_TABLET | Freq: Every day | ORAL | Status: DC
  Administered 2016-05-06 – 2016-05-08 (×3): 300 mg via ORAL
  Filled 2016-05-06 (×3): qty 1

## 2016-05-06 MED ORDER — ACETAMINOPHEN 325 MG PO TABS
ORAL_TABLET | ORAL | Status: AC
  Filled 2016-05-06: qty 2

## 2016-05-06 MED ORDER — DEXTROSE 5 % IV SOLN
1.0000 g | Freq: Every evening | INTRAVENOUS | Status: DC
  Administered 2016-05-06 – 2016-05-08 (×3): 1 g via INTRAVENOUS
  Filled 2016-05-06 (×4): qty 1

## 2016-05-06 MED ORDER — NEBIVOLOL HCL 10 MG PO TABS
10.0000 mg | ORAL_TABLET | Freq: Two times a day (BID) | ORAL | Status: DC
  Administered 2016-05-06 – 2016-05-09 (×5): 10 mg via ORAL
  Filled 2016-05-06 (×9): qty 1

## 2016-05-06 MED ORDER — RENA-VITE PO TABS
1.0000 | ORAL_TABLET | Freq: Every day | ORAL | Status: DC
  Administered 2016-05-06 – 2016-05-08 (×3): 1 via ORAL
  Filled 2016-05-06 (×3): qty 1

## 2016-05-06 MED ORDER — VANCOMYCIN HCL IN DEXTROSE 1-5 GM/200ML-% IV SOLN
INTRAVENOUS | Status: AC
  Filled 2016-05-06: qty 200

## 2016-05-06 MED ORDER — PENTAFLUOROPROP-TETRAFLUOROETH EX AERO: 1.0000 "application " | INHALATION_SPRAY | CUTANEOUS | Status: DC | PRN

## 2016-05-06 MED ORDER — ACETAMINOPHEN 325 MG PO TABS
650.0000 mg | ORAL_TABLET | Freq: Four times a day (QID) | ORAL | Status: DC | PRN
  Administered 2016-05-06 – 2016-05-07 (×3): 650 mg via ORAL
  Filled 2016-05-06 (×2): qty 2

## 2016-05-06 MED ORDER — ONDANSETRON HCL 4 MG PO TABS
4.0000 mg | ORAL_TABLET | Freq: Four times a day (QID) | ORAL | Status: DC | PRN
  Administered 2016-05-07: 4 mg via ORAL
  Filled 2016-05-06: qty 1

## 2016-05-06 MED ORDER — HEPARIN SODIUM (PORCINE) 1000 UNIT/ML DIALYSIS: 1000.0000 [IU] | INTRAMUSCULAR | Status: DC | PRN

## 2016-05-06 MED ORDER — HYDROCODONE-HOMATROPINE 5-1.5 MG/5ML PO SYRP
5.0000 mL | ORAL_SOLUTION | ORAL | Status: DC | PRN
  Administered 2016-05-06 – 2016-05-07 (×3): 5 mL via ORAL
  Filled 2016-05-06 (×3): qty 5

## 2016-05-06 MED ORDER — HEPARIN SODIUM (PORCINE) 5000 UNIT/ML IJ SOLN
5000.0000 [IU] | Freq: Three times a day (TID) | INTRAMUSCULAR | Status: DC
Start: 2016-05-06 — End: 2016-05-09
  Administered 2016-05-06 – 2016-05-09 (×8): 5000 [IU] via SUBCUTANEOUS
  Filled 2016-05-06 (×7): qty 1

## 2016-05-06 MED ORDER — HEPARIN SODIUM (PORCINE) 1000 UNIT/ML DIALYSIS: 4000.0000 [IU] | Freq: Once | INTRAMUSCULAR | Status: DC

## 2016-05-06 MED ORDER — LIDOCAINE-PRILOCAINE 2.5-2.5 % EX CREA: 1.0000 "application " | TOPICAL_CREAM | CUTANEOUS | Status: DC | PRN

## 2016-05-06 MED ORDER — VANCOMYCIN HCL IN DEXTROSE 1-5 GM/200ML-% IV SOLN
1000.0000 mg | INTRAVENOUS | Status: DC
  Administered 2016-05-06 – 2016-05-08 (×2): 1000 mg via INTRAVENOUS
  Filled 2016-05-06: qty 200

## 2016-05-06 NOTE — Progress Notes (Addendum)
Pt with complaints of itching post HD. Page to K. Schorr for notification. New order for Sarna lotion PRN. Dorthey Sawyer, RN

## 2016-05-06 NOTE — ED Triage Notes (Signed)
PT is here with increasing sob, fever, and cough.  Had cxr on Monday and was informed nothing abnormal.  Full dialysis on Monday and has not gone today.

## 2016-05-06 NOTE — ED Notes (Signed)
Admitting at bedside 

## 2016-05-06 NOTE — ED Notes (Signed)
Pt stated unable to give urine sample at this time, call light at bedside

## 2016-05-06 NOTE — ED Provider Notes (Signed)
[facility] DEPT Provider Note   CSN: [id number] Arrival date & time: 05/06/16  1051     History   Chief Complaint Chief Complaint  Patient presents with  . Shortness of Breath  . Cough    HPI Brenda Davis is a 51 y.o. female.  HPI  Pt with hx of ESRD on dialysis, DM, HTN presenting with c/o cough, fever shortness of breath she states symptoms began approx 5 days ago, she began to run fever 2 days ago - was given dialysis 2 days ago at her last dialysis session.  She had tmax last night of 103, called dialysis this morning and let them know she was too short of breath and too weak to come.  They advised she come to the ED.  No chest pain.  She does feel short of breath even without exertion.  Not able to lie flat.  Only dialysis session missed is today.  Coughing up mucous.  No sick contacts.  Did have influenza vaccine this season.  There are no other associated systemic symptoms, there are no other alleviating or modifying factors. She has had some post-tussive vomiting.  No diarrhea.    Past Medical History:  Diagnosis Date  . Blood transfusion without reported diagnosis   . Chronic kidney disease    STAGE 3 CHRONIC KIDNEY DISEASE SECONDARY TO DIABETIC GLOMERULOSCLEROSIS AND UNCONTROLLED HYPERTENSION - PER OFFICE NOTES DR[facility].  . Depression   . Diabetes mellitus   . Gout   . Gout   . Headache(784.0)    CHRONIC H/A'S  . Hyperlipidemia   . Hypertension   . Morbid obesity ([place])   . Pain    LEFT SHOULDER PAIN - WAS SEEN AT AN URGENT CARE - GIVEN SLING FOR COMFORT AND TOLD ROM AS TOLERATED.    Patient Active Problem List   Diagnosis Date Noted  . HCAP (healthcare-associated pneumonia) 05/06/2016  . Acute respiratory failure with hypoxia ([place]) 05/06/2016  . Anemia due to chronic kidney disease 05/06/2016  . Routine general medical examination at a health care facility 12/05/2015  . Cervical cancer screening 12/05/2015  . Visit for  screening mammogram 01/23/2015  . Sinus pause 01/04/2015  . S/P dialysis catheter insertion ([place])   . Hyperglycemia 12/31/2014  . Gout due to renal impairment 09/13/2014  . Thiamine deficiency 12/02/2012  . Meralgia paraesthetica 11/28/2012  . S/P laparoscopic sleeve gastrectomy 11/28/2012  . B12 deficiency anemia 11/28/2012  . Hyperlipidemia with target LDL less than 100 04/09/2008  . CKD (chronic kidney disease) stage V requiring chronic dialysis (MWF) 02/08/2008  . Proteinuria 01/16/2008  . HYPERTENSION, BENIGN ESSENTIAL 01/20/1999    Past Surgical History:  Procedure Laterality Date  . AV FISTULA PLACEMENT Left 01/03/2015   Procedure: BRACHIAL CEPHALIC ARTERIOVENOUS  FISTULA CREATION LEFT ARM;  Surgeon: Angelia Mould, MD;  Location: [place];  Service: Vascular;  Laterality: Left;  . CARPAL TUNNEL RELEASE  2008  . [place]  . ESOPHAGOGASTRODUODENOSCOPY N/A 09/04/2012   Procedure: ESOPHAGOGASTRODUODENOSCOPY (EGD);  Surgeon: Shann Medal, MD;  [facility];  Service: General;  Laterality: N/A;  PF  . ESOPHAGOGASTRODUODENOSCOPY (EGD) WITH ESOPHAGEAL DILATION N/A 09/29/2012   Procedure: ESOPHAGOGASTRODUODENOSCOPY (EGD) WITH ESOPHAGEAL DILATION;  Surgeon: Milus Banister, MD;  [facility];  Service: Endoscopy;  Laterality: N/A;  . INSERTION OF DIALYSIS CATHETER N/A 01/03/2015   Procedure: INSERTION OF DIALYSIS CATHETER RIGHT INTERNAL JUGULAR;  Surgeon: Angelia Mould, MD;  [facility];  Service: Vascular;  Laterality: N/A;  . LAPAROSCOPIC GASTRIC SLEEVE RESECTION N/A 07/19/2012   Procedure: LAPAROSCOPIC SLEEVE GASTRECTOMY with EGD;  Surgeon: Madilyn Hook, DO;  [facility];  Service: General;  Laterality: N/A;  laparoscopic sleeve gastrectomy with EGD  . PERIPHERAL VASCULAR CATHETERIZATION Left 05/23/2015   Procedure: Nolon Stalls;  Surgeon: Conrad Indios, MD;  Location: [place] [facility];  Service: Cardiovascular;  Laterality:  Left;  upper aRM  . PERIPHERAL VASCULAR CATHETERIZATION Left 05/23/2015   Procedure: Peripheral Vascular Balloon Angioplasty;  Surgeon: Conrad [place]rose, MD;  Location: [place] [facility];  Service: Cardiovascular;  Laterality: Left;  av fistula  . UPPER GI ENDOSCOPY N/A 07/19/2012   Procedure: UPPER GI ENDOSCOPY;  Surgeon: Madilyn Hook, DO;  [facility];  Service: General;  Laterality: N/A;    OB History    No data available       Home Medications    Prior to Admission medications   Medication Sig Start Date End Date Taking? Authorizing Provider  amLODipine (NORVASC) 10 MG tablet Take 1 tablet (10 mg total) by mouth daily. 01/07/15  Yes Eugenie Filler, MD  calcium acetate (PHOSLO) 667 MG capsule Take 2 capsules (1,334 mg total) by mouth 3 (three) times daily with meals. 01/07/15  Yes Eugenie Filler, MD  irbesartan (AVAPRO) 300 MG tablet Take 300 mg by mouth at bedtime. 03/22/16  Yes Historical Provider, MD  multivitamin (RENA-VIT) TABS tablet Take 1 tablet by mouth at bedtime. 01/07/15  Yes Eugenie Filler, MD  nebivolol (BYSTOLIC) 5 MG tablet Take 1 tablet (5 mg total) by mouth 2 (two) times daily. Patient taking differently: Take 10 mg by mouth 2 (two) times daily.  01/07/15  Yes Eugenie Filler, MD  BYSTOLIC 10 MG tablet TAKE 2 TABLETS (20 MG TOTAL) BY MOUTH DAILY. Patient not taking: Reported on 05/06/2016 03/23/16   [name], MD  colchicine 0.6 MG tablet Take 1 tablet (0.6 mg total) by mouth 2 (two) times daily. Patient not taking: Reported on 05/06/2016 01/29/15   [name], MD  Cyanocobalamin 500 MCG/0.1ML SOLN Place 0.1 mLs (500 mcg total) into the nose once a week. Patient not taking: Reported on 05/06/2016 09/19/15   [name], MD  docusate sodium (COLACE) 100 MG capsule Take 1 capsule (100 mg total) by mouth every 12 (twelve) hours. Patient not taking: Reported on 05/06/2016 02/05/15   Samantha Tripp Dowless, PA-C  oxycodone (OXY-IR) 5 MG capsule Take 1 capsule  (5 mg total) by mouth every 4 (four) hours as needed. Patient not taking: Reported on 05/06/2016 01/28/15   [name], MD    Family History Family History  Problem Relation Age of Onset  . Hypertension Mother   . Diabetes Father   . Arthritis Other   . Hyperlipidemia Other   . Cancer Neg Hx   . Heart disease Neg Hx   . Kidney disease Neg Hx   . Stroke Neg Hx     Social History Social History  Substance Use Topics  . Smoking status: Former Smoker    Years: 20.00    Types: Cigarettes  . Smokeless tobacco: Never Used     Comment: QUIT SMOKING ABOUT 2009  . Alcohol use 0.6 oz/week    1 Glasses of wine per week     Allergies   Clonidine derivatives and Welchol [colesevelam hcl]   Review of Systems Review of Systems  ROS reviewed and all otherwise negative  except for mentioned in HPI   Physical Exam Updated Vital Signs BP (!) 196/102   Pulse 94   Temp 98 F (36.7 C) (Oral)   Resp (!) 23   SpO2 100%  Vitals reviewed Physical Exam Physical Examination: General appearance - alert, well appearing, and in no distress Mental status - alert, oriented to person, place, and time Eyes - no conjunctival injection, no scleral icterus Mouth - mucous membranes moist, pharynx normal without lesions Neck - supple, no significant adenopathy Chest - clear to auscultation, no wheezes, rales or rhonchi, symmetric air entry Heart - normal rate, regular rhythm, normal S1, S2, no murmurs, rubs, clicks or gallops Abdomen - soft, nontender, nondistended, no masses or organomegaly Neurological - alert, oriented, normal speech Extremities - peripheral pulses normal, no pedal edema, no clubbing or cyanosis Skin - normal coloration and turgor, no rashes  ED Treatments / Results  Labs (all labs ordered are listed, but only abnormal results are displayed) Labs Reviewed  RESPIRATORY PANEL BY PCR - Abnormal; Notable for the following:       Result Value   Metapneumovirus DETECTED (*)      All other components within normal limits  COMPREHENSIVE METABOLIC PANEL - Abnormal; Notable for the following:    Chloride 97 (*)    Glucose, Bld 112 (*)    BUN 38 (*)    Creatinine, Ser 10.65 (*)    GFR calc non Af Amer 4 (*)    GFR calc Af Amer 4 (*)    All other components within normal limits  CBC WITH DIFFERENTIAL/PLATELET - Abnormal; Notable for the following:    RBC 3.34 (*)    Hemoglobin 10.2 (*)    HCT 32.2 (*)    All other components within normal limits  URINALYSIS, ROUTINE W REFLEX MICROSCOPIC - Abnormal; Notable for the following:    APPearance HAZY (*)    Glucose, UA 50 (*)    Protein, ur >=300 (*)    Squamous Epithelial / LPF 6-30 (*)    All other components within normal limits  PHOSPHORUS - Abnormal; Notable for the following:    Phosphorus 6.8 (*)    All other components within normal limits  CULTURE, BLOOD (ROUTINE X 2)  CULTURE, BLOOD (ROUTINE X 2)  CULTURE, EXPECTORATED SPUTUM-ASSESSMENT  GRAM STAIN  PROTIME-INR  STREP PNEUMONIAE URINARY ANTIGEN  MAGNESIUM  LEGIONELLA PNEUMOPHILA SEROGP 1 UR AG  COMPREHENSIVE METABOLIC PANEL  CBC  I-STAT CG4 LACTIC ACID, ED  I-STAT CG4 LACTIC ACID, ED    EKG  EKG Interpretation  Date/Time:  Wednesday May 06 2016 11:01:27 EDT Ventricular Rate:  87 PR Interval:  160 QRS Duration: 66 QT Interval:  390 QTC Calculation: 469 R Axis:   51 Text Interpretation:  Normal sinus rhythm Normal ECG No significant change since last tracing Confirmed by Canary Brim  MD, Holley Kocurek [phone]) on 05/06/2016 11:53:22 AM       Radiology Dg Chest 2 View  Result Date: 05/06/2016 CLINICAL DATA:  Fevers, chills, body aches, chest congestion EXAM: CHEST  2 VIEW COMPARISON:  05/04/2016, 12/05/2015, 01/23/2015 FINDINGS: There is mild bilateral interstitial prominence. There is no focal consolidation. There is no pleural effusion or pneumothorax. The heart and mediastinal contours are unremarkable. The osseous structures are unremarkable.  IMPRESSION: Cardiomegaly with bilateral mild interstitial prominence which may reflect pulmonary vascular congestion versus interstitial infection. Electronically Signed   By: Kathreen Devoid   On: 05/06/2016 11:43    Procedures Procedures (including critical care time)  Medications Ordered in ED Medications  vancomycin (VANCOCIN) 2,500 mg in sodium chloride 0.9 % 500 mL IVPB (2,500 mg Intravenous Not Given 05/06/16 1445)  ceFEPIme (MAXIPIME) 1 g in dextrose 5 % 50 mL IVPB (1 g Intravenous Transfusing/Transfer 05/06/16 1527)  vancomycin (VANCOCIN) IVPB 1000 mg/200 mL premix (not administered)  ipratropium-albuterol (DUONEB) 0.5-2.5 (3) MG/3ML nebulizer solution 3 mL (not administered)  HYDROcodone-homatropine (HYCODAN) 5-1.5 MG/5ML syrup 5 mL (5 mLs Oral Given 05/06/16 1701)  calcium acetate (PHOSLO) capsule 1,334 mg (1,334 mg Oral Given 05/06/16 1701)  multivitamin (RENA-VIT) tablet 1 tablet (not administered)  heparin injection 5,000 Units (not administered)  acetaminophen (TYLENOL) tablet 650 mg (not administered)    Or  acetaminophen (TYLENOL) suppository 650 mg (not administered)  ondansetron (ZOFRAN) tablet 4 mg (not administered)    Or  ondansetron (ZOFRAN) injection 4 mg (not administered)  irbesartan (AVAPRO) tablet 300 mg (not administered)  amLODipine (NORVASC) tablet 10 mg (not administered)  nebivolol (BYSTOLIC) tablet 10 mg (not administered)  pentafluoroprop-tetrafluoroeth (GEBAUERS) aerosol 1 application (not administered)  lidocaine (PF) (XYLOCAINE) 1 % injection 5 mL (not administered)  lidocaine-prilocaine (EMLA) cream 1 application (not administered)  0.9 %  sodium chloride infusion (not administered)  0.9 %  sodium chloride infusion (not administered)  heparin injection 1,000 Units (not administered)  alteplase (CATHFLO ACTIVASE) injection 2 mg (not administered)  heparin injection 4,000 Units (not administered)     Initial Impression / Assessment and Plan / ED  Course  I have reviewed the triage vital signs and the nursing notes.  Pertinent labs & imaging results that were available during my care of the patient were reviewed by me and considered in my medical decision making (see chart for details).   d/w triad for admission.  presenation appears most c/w viral pneumonia- however will cover for HCAP given that she is a dialysis patient.  RVP also sent.  Pt is hypoxic to 88% on RA- she is on [place] O2.     Final Clinical Impressions(s) / ED Diagnoses   Final diagnoses:  Cough  Febrile illness  Dyspnea, unspecified type  End stage renal disease [facility])    New Prescriptions Current Discharge Medication List       Alfonzo Beers, MD 05/06/16 [phone]

## 2016-05-06 NOTE — Consult Note (Signed)
Renal Service Consult Note [facility]  Brenda Davis 05/06/2016 Roney Jaffe D Requesting Physician:  Dr Marily Memos  Reason for Consult:  ESRD pt with fevers, cough and SOB, hypoxia HPI: The patient is a 51 y.o. year-old with ESRD on HD MWF presenting with fevers to 103deg, cough, nasal congestion, starting about 5 days ago.  Last HD was 2 days ago on Monday.  Has been losing weight intentionally as well and says that lately she is showing up right at her dry wt.  No prod cough.  In ED pt coughing a lot, CXR question for pneumonitis, so pt was admitted with dx of HCAP and started on IV abx, O2, nebs. O2 sats were 88% on RA and 98% on 2L O2.    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  Husband here with her.  No tob/ etoh.    PSH - lap gastric sleeve , aVF, C-section    ROS  denies CP  no joint pain   no HA  no blurry vision  no rash  no diarrhea  no nausea/ vomitin   Past Medical History  Past Medical History:  Diagnosis Date  . Blood transfusion without reported diagnosis   . Chronic kidney disease    STAGE 3 CHRONIC KIDNEY DISEASE SECONDARY TO DIABETIC GLOMERULOSCLEROSIS AND UNCONTROLLED HYPERTENSION - PER OFFICE NOTES DR. Fl[facility].  . Depression   . Diabetes mellitus   . Gout   . Gout   . Headache(784.0)    CHRONIC H/A'S  . Hyperlipidemia   . Hypertension   . Morbid obesity ([place])   . Pain    LEFT SHOULDER PAIN - WAS SEEN AT AN URGENT CARE - GIVEN SLING FOR COMFORT AND TOLD ROM AS TOLERATED.   Past Surgical History  Past Surgical History:  Procedure Laterality Date  . AV FISTULA PLACEMENT Left 01/03/2015   Procedure: BRACHIAL CEPHALIC ARTERIOVENOUS  FISTULA CREATION LEFT ARM;  Surgeon: Angelia Mould, MD;  Location: [place];  Service: Vascular;  Laterality: Left;  . CARPAL TUNNEL RELEASE  2008  . [place]  .  ESOPHAGOGASTRODUODENOSCOPY N/A 09/04/2012   Procedure: ESOPHAGOGASTRODUODENOSCOPY (EGD);  Surgeon: Shann Medal, MD;  Location: [facility];  Service: General;  Laterality: N/A;  PF  . ESOPHAGOGASTRODUODENOSCOPY (EGD) WITH ESOPHAGEAL DILATION N/A 09/29/2012   Procedure: ESOPHAGOGASTRODUODENOSCOPY (EGD) WITH ESOPHAGEAL DILATION;  Surgeon: Milus Banister, MD;  [facility];  Service: Endoscopy;  Laterality: N/A;  . INSERTION OF DIALYSIS CATHETER N/A 01/03/2015   Procedure: INSERTION OF DIALYSIS CATHETER RIGHT INTERNAL JUGULAR;  Surgeon: Angelia Mould, MD;  Location: [place];  Service: Vascular;  Laterality: N/A;  . LAPAROSCOPIC GASTRIC SLEEVE RESECTION N/A 07/19/2012   Procedure: LAPAROSCOPIC SLEEVE GASTRECTOMY with EGD;  Surgeon: Madilyn Hook, DO;  [facility];  Service: General;  Laterality: N/A;  laparoscopic sleeve gastrectomy with EGD  . PERIPHERAL VASCULAR CATHETERIZATION Left 05/23/2015   Procedure: Nolon Stalls;  Surgeon: Conrad Wamsutter, MD;  Location: [place] [facility];  Service: Cardiovascular;  Laterality: Left;  upper aRM  . PERIPHERAL VASCULAR CATHETERIZATION Left 05/23/2015   Procedure: Peripheral Vascular Balloon Angioplasty;  Surgeon: Conrad [place], MD;  Location: [place] [facility];  Service: Cardiovascular;  Laterality: Left;  av fistula  . UPPER GI ENDOSCOPY N/A 07/19/2012   Procedure: UPPER GI ENDOSCOPY;  Surgeon: Madilyn Hook, DO;  [facility];  Service: General;  Laterality: N/A;   Family History  Family History  Problem Relation Age of Onset  . Hypertension Mother   . Diabetes Father   . Arthritis Other   . Hyperlipidemia Other   . Cancer Neg Hx   . Heart disease Neg Hx   . Kidney disease Neg Hx   . Stroke Neg Hx    Social History  reports that she has quit smoking. Her smoking use included Cigarettes. She quit after 20.00 years of use. She has never used smokeless tobacco. She reports that she drinks about 0.6 oz of alcohol per week . She reports  that she does not use drugs. Allergies  Allergies  Allergen Reactions  . Clonidine Derivatives Swelling  . Welchol [Colesevelam Hcl] Nausea Only   Home medications Prior to Admission medications   Medication Sig Start Date End Date Taking? Authorizing Provider  amLODipine (NORVASC) 10 MG tablet Take 1 tablet (10 mg total) by mouth daily. 01/07/15  Yes Eugenie Filler, MD  calcium acetate (PHOSLO) 667 MG capsule Take 2 capsules (1,334 mg total) by mouth 3 (three) times daily with meals. 01/07/15  Yes Eugenie Filler, MD  irbesartan (AVAPRO) 300 MG tablet Take 300 mg by mouth at bedtime. 03/22/16  Yes Historical Provider, MD  multivitamin (RENA-VIT) TABS tablet Take 1 tablet by mouth at bedtime. 01/07/15  Yes Eugenie Filler, MD  nebivolol (BYSTOLIC) 5 MG tablet Take 1 tablet (5 mg total) by mouth 2 (two) times daily. Patient taking differently: Take 10 mg by mouth 2 (two) times daily.  01/07/15  Yes Eugenie Filler, MD  BYSTOLIC 10 MG tablet TAKE 2 TABLETS (20 MG TOTAL) BY MOUTH DAILY. Patient not taking: Reported on 05/06/2016 03/23/16   [name], MD  colchicine 0.6 MG tablet Take 1 tablet (0.6 mg total) by mouth 2 (two) times daily. Patient not taking: Reported on 05/06/2016 01/29/15   [name], MD  Cyanocobalamin 500 MCG/0.1ML SOLN Place 0.1 mLs (500 mcg total) into the nose once a week. Patient not taking: Reported on 05/06/2016 09/19/15   [name], MD  docusate sodium (COLACE) 100 MG capsule Take 1 capsule (100 mg total) by mouth every 12 (twelve) hours. Patient not taking: Reported on 05/06/2016 02/05/15   Samantha Tripp Dowless, PA-C  oxycodone (OXY-IR) 5 MG capsule Take 1 capsule (5 mg total) by mouth every 4 (four) hours as needed. Patient not taking: Reported on 05/06/2016 01/28/15   [name], MD   Liver Function Tests  Recent Labs Lab 05/06/16 1148  AST 34  ALT 34  ALKPHOS 80  BILITOT 0.8  PROT 7.4  ALBUMIN 3.6   No results for input(s): LIPASE,  AMYLASE in the last 168 hours. CBC  Recent Labs Lab 05/06/16 1148  WBC 6.8  NEUTROABS 4.2  HGB 10.2*  HCT 32.2*  MCV 96.4  PLT 518   Basic Metabolic Panel  Recent Labs Lab 05/06/16 1148  NA 138  K 5.0  CL 97*  CO2 27  GLUCOSE 112*  BUN 38*  CREATININE 10.65*  CALCIUM 8.9   Iron/TIBC/Ferritin/ %Sat    Component Value Date/Time   IRON 62 01/03/2015 1650   TIBC 290 01/03/2015 1650   FERRITIN 286 01/03/2015 1650   IRONPCTSAT 21 01/03/2015 1650    Vitals:   05/06/16 1405 05/06/16 1415 05/06/16 1500 05/06/16 1512  BP: (!) 176/94 (!) 182/97 (!) 196/102   Pulse: 87 88 94   Resp: 18 18 (!) 23  Temp:    98 F (36.7 C)  TempSrc:    Oral  SpO2: 98% 96% 100%    Exam Gen mild ^WOB, congested, warm to touch, coughing No rash, cyanosis or gangrene Sclera anicteric, throat clear  No jvd or bruits Chest scattered bibasilar crackles, rhonchi RRR no MRG Abd soft ntnd no mass or ascites +bs obese GU defer MS no joint effusions or deformity Ext no sig LE edema / no wounds or ulcers Neuro is alert, Ox 3 , nf LUA AVF+bruit    Dialysis: [facility]WF [facility] 4h  131.5kg   2/2.25 bath  P4  Hep 4000  LUA AVF - Mircera 50 every 4 wks, last 3/23 - no Fe, ESA - last Hb 10.7, Ca 9.6, P 5.7, pth 292    Assessment: 1. SOB/ cough/ fevers - virus screen + for metapneumovirus; getting broad-spec abx.for poss HCAP   2. Hypoxemia - may have some fluid overload, trying to lose wt and is at dry wt now w rales and early IS edema on CXR.   3. ESRD MWF HD 4. Anemia =- not on ESA 5. MBD of CKD cont meds 6. HTN - bp's up, get vol down, lower dry     Plan - HD tonight, max UF as tolerated  Kelly Splinter MD [place] pager [phone]   05/06/2016, 4:57 PM

## 2016-05-06 NOTE — H&P (Signed)
History and Physical    Brenda Davis LZJ:[id number] DOB: [birth year] DOA: 05/06/2016   PCP: Scarlette Calico, MD   Patient coming from/Resides with: Private residence/husband  Admission status: Inpatient/floor -medically necessary to stay a minimum 2 midnights to rule out impending and/or unexpected changes in physiologic status that may differ from initial evaluation performed in the ER and/or at time of admission. Patient presents with acute hypoxemia in setting of shortness of breath cough and fevers that began this past Friday. Chest x-ray concerning for pneumonitis. Because she attends dialyses she is at risk for secondary bacterial infection so will be treated for HCAP. She will require supportive care with oxygen, inpatient dialysis, empiric broad-spectrum IV antibiotics as described above. Due to paroxysmal coughing so she will require prn nebulizer treatments as well as cough suppressants.  Chief Complaint: Cough, fevers, shortness of breath  HPI: Brenda Davis is a 51 y.o. female with medical history significant for chronic kidney disease now on dialysis less than 1 year, hypertension, dyslipidemia, anemia of chronic kidney disease, obesity. Patient reports that beginning this past Friday she developed cough fevers and shortness of breath. Overnight she had a MAXIMUM TEMPERATURE of 10 18F that went down to 10 74F after Tylenol. He reports she was too weak to attend dialysis. She also reports that sometimes after dialysis she is very very weak become short of breath postdialysis but only on postdialysis days. She is still trying to work while going to dialysis but sometimes has to leave work after 2 hours due to persistent weakness. Her last dialysis treatment was on Monday. She missed today as described above.  ED Course:  Vital Signs: BP (!) 196/102   Pulse 94   Temp 98 F (36.7 C) (Oral)   Resp (!) 23   SpO2 100%  2 view CXR: Cardiomegaly with bilateral mild  interstitial prominence which may reflect pulmonary vascular congestion versus interstitial infection Lab Data: Sodium 138, potassium 5.0, chloride 97, CO2 27, glucose 112, BUN 38, creatinine 10.65, LFTs normal, lactic acid 1.57 and 0.80, white count 6800 with normal differential, hemoglobin 10.2, platelets 248,000, coags normal respiratory viral panel obtained in ER, blood cultures obtained in ER, urinalysis mildly abnormal but not consistent with UTI Medications and treatments: None  Review of Systems:  In addition to the HPI above,  No Headache, changes with Vision or hearing, new weakness, tingling, numbness in any extremity, dizziness, dysarthria or word finding difficulty, gait disturbance or imbalance, tremors or seizure activity No problems swallowing food or Liquids, indigestion/reflux, choking or coughing while eating, abdominal pain with or after eating No Chest pain, palpitations, orthopnea  No Abdominal pain, N/V, melena,hematochezia, dark tarry stools, constipation No dysuria, malodorous urine, hematuria or flank pain No new skin rashes, lesions, masses or bruises, No new joint pains, aches, swelling or redness No recent unintentional weight gain or loss No polyuria, polydypsia or polyphagia   Past Medical History:  Diagnosis Date  . Blood transfusion without reported diagnosis   . Chronic kidney disease    STAGE 3 CHRONIC KIDNEY DISEASE SECONDARY TO DIABETIC GLOMERULOSCLEROSIS AND UNCONTROLLED HYPERTENSION - PER OFFICE NOTES DR. [facility].  . Depression   . Diabetes mellitus   . Gout   . Gout   . Headache(784.0)    CHRONIC H/A'S  . Hyperlipidemia   . Hypertension   . Morbid obesity ([place])   . Pain    LEFT SHOULDER PAIN - WAS SEEN AT AN URGENT CARE - GIVEN SLING FOR  COMFORT AND TOLD ROM AS TOLERATED.    Past Surgical History:  Procedure Laterality Date  . AV FISTULA PLACEMENT Left 01/03/2015   Procedure: BRACHIAL CEPHALIC ARTERIOVENOUS  FISTULA  CREATION LEFT ARM;  Surgeon: Angelia Mould, MD;  Location: [place];  Service: Vascular;  Laterality: Left;  . CARPAL TUNNEL RELEASE  2008  . [place]  . ESOPHAGOGASTRODUODENOSCOPY N/A 09/04/2012   Procedure: ESOPHAGOGASTRODUODENOSCOPY (EGD);  Surgeon: Shann Medal, MD;  [facility];  Service: General;  Laterality: N/A;  PF  . ESOPHAGOGASTRODUODENOSCOPY (EGD) WITH ESOPHAGEAL DILATION N/A 09/29/2012   Procedure: ESOPHAGOGASTRODUODENOSCOPY (EGD) WITH ESOPHAGEAL DILATION;  Surgeon: Milus Banister, MD;  [facility];  Service: Endoscopy;  Laterality: N/A;  . INSERTION OF DIALYSIS CATHETER N/A 01/03/2015   Procedure: INSERTION OF DIALYSIS CATHETER RIGHT INTERNAL JUGULAR;  Surgeon: Angelia Mould, MD;  Location: [place];  Service: Vascular;  Laterality: N/A;  . LAPAROSCOPIC GASTRIC SLEEVE RESECTION N/A 07/19/2012   Procedure: LAPAROSCOPIC SLEEVE GASTRECTOMY with EGD;  Surgeon: Madilyn Hook, DO;  [facility];  Service: General;  Laterality: N/A;  laparoscopic sleeve gastrectomy with EGD  . PERIPHERAL VASCULAR CATHETERIZATION Left 05/23/2015   Procedure: Nolon Stalls;  Surgeon: Conrad Chemung, MD;  Location: [place] [facility];  Service: Cardiovascular;  Laterality: Left;  upper aRM  . PERIPHERAL VASCULAR CATHETERIZATION Left 05/23/2015   Procedure: Peripheral Vascular Balloon Angioplasty;  Surgeon: Conrad Nipinn[place]asee, MD;  Location: [place] [facility];  Service: Cardiovascular;  Laterality: Left;  av fistula  . UPPER GI ENDOSCOPY N/A 07/19/2012   Procedure: UPPER GI ENDOSCOPY;  Surgeon: Madilyn Hook, DO;  [facility];  Service: General;  Laterality: N/A;    Social History   Social History  . Marital status: Single    Spouse name: N/A  . Number of children: N/A  . Years of education: N/A   Occupational History  . Not on file.   Social History Main Topics  . Smoking status: Former Smoker    Years: 20.00    Types: Cigarettes  . Smokeless tobacco:  Never Used     Comment: QUIT SMOKING ABOUT 2009  . Alcohol use 0.6 oz/week    1 Glasses of wine per week  . Drug use: No  . Sexual activity: Not on file   Other Topics Concern  . Not on file   Social History Narrative  . 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 Clonidine Derivatives Swelling  . Welchol [Colesevelam Hcl] Nausea Only    Family History  Problem Relation Age of Onset  . Hypertension Mother   . Diabetes Father   . Arthritis Other   . Hyperlipidemia Other   . Cancer Neg Hx   . Heart disease Neg Hx   . Kidney disease Neg Hx   . Stroke Neg Hx      Prior to Admission medications   Medication Sig Start Date End Date Taking? Authorizing Provider  amLODipine (NORVASC) 10 MG tablet Take 1 tablet (10 mg total) by mouth daily. 01/07/15  Yes Eugenie Filler, MD  calcium acetate (PHOSLO) 667 MG capsule Take 2 capsules (1,334 mg total) by mouth 3 (three) times daily with meals. 01/07/15  Yes Eugenie Filler, MD  irbesartan (AVAPRO) 300 MG tablet Take 300 mg by mouth at bedtime. 03/22/16  Yes Historical Provider, MD  multivitamin (RENA-VIT)  TABS tablet Take 1 tablet by mouth at bedtime. 01/07/15  Yes Eugenie Filler, MD  nebivolol (BYSTOLIC) 5 MG tablet Take 1 tablet (5 mg total) by mouth 2 (two) times daily. Patient taking differently: Take 10 mg by mouth 2 (two) times daily.  01/07/15  Yes Eugenie Filler, MD  BYSTOLIC 10 MG tablet TAKE 2 TABLETS (20 MG TOTAL) BY MOUTH DAILY. Patient not taking: Reported on 05/06/2016 03/23/16   [name], MD  colchicine 0.6 MG tablet Take 1 tablet (0.6 mg total) by mouth 2 (two) times daily. Patient not taking: Reported on 05/06/2016 01/29/15   [name], MD  Cyanocobalamin 500 MCG/0.1ML SOLN Place 0.1 mLs (500 mcg total) into the nose once a week. Patient not taking: Reported on 05/06/2016 09/19/15   [name], MD  docusate  sodium (COLACE) 100 MG capsule Take 1 capsule (100 mg total) by mouth every 12 (twelve) hours. Patient not taking: Reported on 05/06/2016 02/05/15   Samantha Tripp Dowless, PA-C  oxycodone (OXY-IR) 5 MG capsule Take 1 capsule (5 mg total) by mouth every 4 (four) hours as needed. Patient not taking: Reported on 05/06/2016 01/28/15   [name], MD    Physical Exam: Vitals:   05/06/16 1405 05/06/16 1415 05/06/16 1500 05/06/16 1512  BP: (!) 176/94 (!) 182/97 (!) 196/102   Pulse: 87 88 94   Resp: 18 18 (!) 23   Temp:    98 F (36.7 C)  TempSrc:    Oral  SpO2: 98% 96% 100%       Constitutional: NAD, calm, comfortableUntil develops paroxysmal coughing episodes Eyes: PERRL, lids and conjunctivae normal ENMT: Mucous membranes are moist. Posterior pharynx clear of any exudate or lesions. Normal dentition.  Neck: normal, supple, no masses, no thyromegaly Respiratory: Mostly clear to auscultation bilaterally, no wheezing, she does have 5 basilar crackles. Normal respiratory effort without accessory muscle use. With inspiratory effort develops paroxysmal coughing episodes. RA O2 sats duration 88%-up to 95-100% on 2 L oxygen Cardiovascular: Regular rate and rhythm, no murmurs / rubs / gallops. No extremity edema. 2+ pedal pulses. No carotid bruits. Blood pressure and hypertensive range Abdomen: no tenderness, no masses palpated. No hepatosplenomegaly. Bowel sounds positive.  Musculoskeletal: no clubbing / cyanosis. No joint deformity upper and lower extremities. Good ROM, no contractures. Normal muscle tone.  Skin: no rashes, lesions, ulcers. No induration Neurologic: CN 2-12 grossly intact. Sensation intact, DTR normal. Strength 5/5 x all 4 extremities.  Psychiatric: Normal judgment and insight. Alert and oriented x 3. Normal mood.    Labs on Admission: I have personally reviewed following labs and imaging studies  CBC:  Recent Labs Lab 05/06/16 1148  WBC 6.8  NEUTROABS 4.2  HGB 10.2*    HCT 32.2*  MCV 96.4  PLT 035   Basic Metabolic Panel:  Recent Labs Lab 05/06/16 1148  NA 138  K 5.0  CL 97*  CO2 27  GLUCOSE 112*  BUN 38*  CREATININE 10.65*  CALCIUM 8.9   GFR: CrCl cannot be calculated (Unknown ideal weight.). Liver Function Tests:  Recent Labs Lab 05/06/16 1148  AST 34  ALT 34  ALKPHOS 80  BILITOT 0.8  PROT 7.4  ALBUMIN 3.6   No results for input(s): LIPASE, AMYLASE in the last 168 hours. No results for input(s): AMMONIA in the last 168 hours. Coagulation Profile:  Recent Labs Lab 05/06/16 1148  INR 1.00   Cardiac Enzymes: No results for input(s): CKTOTAL, CKMB, CKMBINDEX, TROPONINI  in the last 168 hours. BNP (last 3 results) No results for input(s): PROBNP in the last 8760 hours. HbA1C: No results for input(s): HGBA1C in the last 72 hours. CBG: No results for input(s): GLUCAP in the last 168 hours. Lipid Profile: No results for input(s): CHOL, HDL, LDLCALC, TRIG, CHOLHDL, LDLDIRECT in the last 72 hours. Thyroid Function Tests: No results for input(s): TSH, T4TOTAL, FREET4, T3FREE, THYROIDAB in the last 72 hours. Anemia Panel: No results for input(s): VITAMINB12, FOLATE, FERRITIN, TIBC, IRON, RETICCTPCT in the last 72 hours. Urine analysis:    Component Value Date/Time   COLORURINE YELLOW 05/06/2016 1330   APPEARANCEUR HAZY (A) 05/06/2016 1330   LABSPEC 1.015 05/06/2016 1330   PHURINE 7.0 05/06/2016 1330   GLUCOSEU 50 (A) 05/06/2016 1330   GLUCOSEU 100 (A) 12/31/2014 1510   HGBUR NEGATIVE 05/06/2016 1330   HGBUR negative 09/17/2009 0000   BILIRUBINUR NEGATIVE 05/06/2016 1330   KETONESUR NEGATIVE 05/06/2016 1330   PROTEINUR >=300 (A) 05/06/2016 1330   UROBILINOGEN 0.2 12/31/2014 1510   NITRITE NEGATIVE 05/06/2016 1330   LEUKOCYTESUR NEGATIVE 05/06/2016 1330   Sepsis Labs: @LABRCNTIP (procalcitonin:4,lacticidven:4) )No results found for this or any previous visit (from the past 240 hour(s)).   Radiological Exams on  Admission: Dg Chest 2 View  Result Date: 05/06/2016 CLINICAL DATA:  Fevers, chills, body aches, chest congestion EXAM: CHEST  2 VIEW COMPARISON:  05/04/2016, 12/05/2015, 01/23/2015 FINDINGS: There is mild bilateral interstitial prominence. There is no focal consolidation. There is no pleural effusion or pneumothorax. The heart and mediastinal contours are unremarkable. The osseous structures are unremarkable. IMPRESSION: Cardiomegaly with bilateral mild interstitial prominence which may reflect pulmonary vascular congestion versus interstitial infection. Electronically Signed   By: Kathreen Devoid   On: 05/06/2016 11:43    EKG: (Independently reviewed) sinus rhythm with ventricular rate 87 bpm, QTC 469 ms, normal R-wave progression, no acute ischemic changes and unchanged from previous  Assessment/Plan Principal Problem:   Acute respiratory failure with hypoxia / HCAP (healthcare-associated pneumonia) -Patient presents with upper respiratory infection symptoms with associated shortness of breath cough and fever onset this past Friday. Chest x-ray concerning for pneumonitis possible viral etiology. -Follow up on respiratory viral panel -Suspect possible secondary bacterial pneumonia so treat as HCAP w/ broad-spectrum empiric antibiotics -Sputum culture -Blood cultures -check urinary legionella and strep  Active Problems:   HYPERTENSION, BENIGN ESSENTIAL -Current blood pressure uncontrolled -Continue preadmission Norvasc, Avapro and Bystolic -Patient describes generalized weakness at the end of each dialysis treatment followed by shortness of breath and generalized malaise-uncertain if patient is taking her antihypertensive medications on day of dialysis and this may be contributing-please explore further    CKD (chronic kidney disease) stage V requiring chronic dialysis (MWF) -Nephrology notified of admission    Hyperlipidemia with target LDL less than 100 -Not on statin prior to  admission    Anemia due to chronic kidney disease -Hemoglobin stable and at baseline      DVT prophylaxis: Subcutaneous heparin Code Status: Full Family Communication: Husband  Disposition Plan: Home Consults called: Nephrology/[place]     Samella Parr ANP-BC [facility] Pager [phone]   If 7PM-7AM, please contact night-coverage [email or web address] Password [email or web address]  05/06/2016, 3:58 PM

## 2016-05-06 NOTE — Progress Notes (Signed)
Pharmacy Antibiotic Note  Brenda Davis is a 51 y.o. female admitted on 05/06/2016 with pneumonia.  Pharmacy has been consulted for vancomycin and cefepime dosing. Patient is ESRD with HD on M,W, F (last HD on 05/04/16) WBC 6.8, afebrile, LA 1.57  Plan: Vancomycin 2500mg  IV x1 today Vancomycin 1000mg  IV post HD Cefepime 1g IV QD F/u HD schedule     Temp (24hrs), Avg:98.9 F (37.2 C), Min:98.9 F (37.2 C), Max:98.9 F (37.2 C)   Recent Labs Lab 05/06/16 1148 05/06/16 1207  WBC 6.8  --   CREATININE 10.65*  --   LATICACIDVEN  --  1.57    CrCl cannot be calculated (Unknown ideal weight.).    Allergies  Allergen Reactions  . Clonidine Derivatives Swelling  . Welchol [Colesevelam Hcl] Nausea Only    Thank you for allowing pharmacy to be a part of this patient's care.  Brenda Davis Brenda Davis 05/06/2016 2:42 PM

## 2016-05-07 DIAGNOSIS — B9781 Human metapneumovirus as the cause of diseases classified elsewhere: Secondary | ICD-10-CM

## 2016-05-07 DIAGNOSIS — B348 Other viral infections of unspecified site: Secondary | ICD-10-CM | POA: Diagnosis present

## 2016-05-07 LAB — CBC
HCT: 33.1 % — ABNORMAL LOW (ref 36.0–46.0)
Hemoglobin: 10.4 g/dL — ABNORMAL LOW (ref 12.0–15.0)
MCH: 30.1 pg (ref 26.0–34.0)
MCHC: 31.4 g/dL (ref 30.0–36.0)
MCV: 95.7 fL (ref 78.0–100.0)
Platelets: 222 10*3/uL (ref 150–400)
RBC: 3.46 MIL/uL — ABNORMAL LOW (ref 3.87–5.11)
RDW: 14 % (ref 11.5–15.5)
WBC: 6.6 10*3/uL (ref 4.0–10.5)

## 2016-05-07 LAB — COMPREHENSIVE METABOLIC PANEL
ALT: 32 U/L (ref 14–54)
AST: 39 U/L (ref 15–41)
Albumin: 3.3 g/dL — ABNORMAL LOW (ref 3.5–5.0)
Alkaline Phosphatase: 80 U/L (ref 38–126)
Anion gap: 12 (ref 5–15)
BUN: 20 mg/dL (ref 6–20)
CO2: 29 mmol/L (ref 22–32)
Calcium: 8.6 mg/dL — ABNORMAL LOW (ref 8.9–10.3)
Chloride: 95 mmol/L — ABNORMAL LOW (ref 101–111)
Creatinine, Ser: 6.91 mg/dL — ABNORMAL HIGH (ref 0.44–1.00)
GFR calc Af Amer: 7 mL/min — ABNORMAL LOW (ref >60)
GFR calc non Af Amer: 6 mL/min — ABNORMAL LOW (ref >60)
Glucose, Bld: 105 mg/dL — ABNORMAL HIGH (ref 65–99)
Potassium: 4.1 mmol/L (ref 3.5–5.1)
Sodium: 136 mmol/L (ref 135–145)
Total Bilirubin: 0.8 mg/dL (ref 0.3–1.2)
Total Protein: 7 g/dL (ref 6.5–8.1)

## 2016-05-07 LAB — MRSA PCR SCREENING: MRSA by PCR: NEGATIVE

## 2016-05-07 LAB — EXPECTORATED SPUTUM ASSESSMENT W GRAM STAIN, RFLX TO RESP C

## 2016-05-07 MED ORDER — CALCIUM CARBONATE ANTACID 1250 MG/5ML PO SUSP
500.0000 mg | Freq: Four times a day (QID) | ORAL | Status: DC | PRN
  Filled 2016-05-07: qty 5

## 2016-05-07 MED ORDER — VANCOMYCIN HCL 10 G IV SOLR
1500.0000 mg | Freq: Once | INTRAVENOUS | Status: AC
  Administered 2016-05-07: 1500 mg via INTRAVENOUS
  Filled 2016-05-07: qty 1500

## 2016-05-07 MED ORDER — GUAIFENESIN ER 600 MG PO TB12
1200.0000 mg | ORAL_TABLET | Freq: Two times a day (BID) | ORAL | Status: DC
  Administered 2016-05-07 – 2016-05-09 (×5): 1200 mg via ORAL
  Filled 2016-05-07 (×5): qty 2

## 2016-05-07 MED ORDER — HYDROXYZINE HCL 25 MG PO TABS
25.0000 mg | ORAL_TABLET | Freq: Three times a day (TID) | ORAL | Status: DC | PRN
Start: 2016-05-07 — End: 2016-05-09
  Administered 2016-05-08: 25 mg via ORAL
  Filled 2016-05-07: qty 1

## 2016-05-07 MED ORDER — PANTOPRAZOLE SODIUM 40 MG PO TBEC
40.0000 mg | DELAYED_RELEASE_TABLET | Freq: Every day | ORAL | Status: DC
  Administered 2016-05-08 – 2016-05-09 (×2): 40 mg via ORAL
  Filled 2016-05-07 (×2): qty 1

## 2016-05-07 MED ORDER — NEPRO/CARBSTEADY PO LIQD: 237.0000 mL | Freq: Three times a day (TID) | ORAL | Status: DC | PRN

## 2016-05-07 MED ORDER — CAMPHOR-MENTHOL 0.5-0.5 % EX LOTN
1.0000 "application " | TOPICAL_LOTION | Freq: Three times a day (TID) | CUTANEOUS | Status: DC | PRN
  Filled 2016-05-07: qty 222

## 2016-05-07 MED ORDER — CEFEPIME HCL 1 G IJ SOLR
1.0000 g | Freq: Once | INTRAMUSCULAR | Status: AC
  Administered 2016-05-07: 1 g via INTRAVENOUS
  Filled 2016-05-07: qty 1

## 2016-05-07 MED ORDER — IPRATROPIUM-ALBUTEROL 0.5-2.5 (3) MG/3ML IN SOLN
3.0000 mL | Freq: Four times a day (QID) | RESPIRATORY_TRACT | Status: DC
  Administered 2016-05-07: 3 mL via RESPIRATORY_TRACT
  Filled 2016-05-07: qty 3

## 2016-05-07 MED ORDER — IPRATROPIUM-ALBUTEROL 0.5-2.5 (3) MG/3ML IN SOLN
3.0000 mL | Freq: Two times a day (BID) | RESPIRATORY_TRACT | Status: DC
  Filled 2016-05-07: qty 3

## 2016-05-07 MED ORDER — IPRATROPIUM-ALBUTEROL 0.5-2.5 (3) MG/3ML IN SOLN: 3.0000 mL | RESPIRATORY_TRACT | Status: DC | PRN

## 2016-05-07 MED ORDER — DOCUSATE SODIUM 283 MG RE ENEM
1.0000 | ENEMA | RECTAL | Status: DC | PRN
  Filled 2016-05-07: qty 1

## 2016-05-07 MED ORDER — DEXTROSE 5 % IV SOLN
1.0000 g | Freq: Once | INTRAVENOUS | Status: DC
  Filled 2016-05-07: qty 1

## 2016-05-07 MED ORDER — ZOLPIDEM TARTRATE 5 MG PO TABS: 5.0000 mg | ORAL_TABLET | Freq: Every evening | ORAL | Status: DC | PRN

## 2016-05-07 MED ORDER — SORBITOL 70 % SOLN: 30.0000 mL | Status: DC | PRN

## 2016-05-07 NOTE — Progress Notes (Signed)
[facility] Progress Note   Subjective: no new c/o, still coughing, no new fevers overnight  Vitals:   05/06/16 2104 05/06/16 2153 05/07/16 0426 05/07/16 1015  BP: (!) 170/84 (!) 165/88 (!) 149/68 135/72  Pulse: 84 90 97 92  Resp: 18 20 18 18   Temp: 98.7 F (37.1 C) 99 F (37.2 C) 98.7 F (37.1 C) 98.5 F (36.9 C)  TempSrc: Oral Oral Oral Oral  SpO2: 99% 97% 97% 98%  Weight: 120.1 kg (264 lb 12.4 oz)       Inpatient medications: . amLODipine  10 mg Oral Daily  . calcium acetate  1,334 mg Oral TID WC  . heparin  5,000 Units Subcutaneous Q8H  . irbesartan  300 mg Oral QHS  . multivitamin  1 tablet Oral QHS  . nebivolol  10 mg Oral BID   . ceFEPime (MAXIPIME) IV 1 g (05/06/16 1511)  . [START ON 05/08/2016] vancomycin Stopped (05/06/16 2117)   acetaminophen **OR** acetaminophen, calcium carbonate (dosed in mg elemental calcium), camphor-menthol **AND** hydrOXYzine, docusate sodium, feeding supplement (NEPRO CARB STEADY), HYDROcodone-homatropine, ipratropium-albuterol, ondansetron **OR** ondansetron (ZOFRAN) IV, sorbitol, zolpidem  Exam: Gen mild ^WOB, congested, warm to touch, coughing No rash, cyanosis or gangrene Sclera anicteric, throat clear  No jvd or bruits Chest scattered bibasilar crackles, rhonchi RRR no MRG Abd soft ntnd no mass or ascites +bs obese GU defer MS no joint effusions or deformity Ext no sig LE edema / no wounds or ulcers Neuro is alert, Ox 3 , nf LUA AVF+bruit    Dialysis: [facility] 4h  131.5kg   2/2.25 bath  P4  Hep 4000  LUA AVF - Mircera 50 every 4 wks, last 3/23 - no Fe, ESA - last Hb 10.7, Ca 9.6, P 5.7, pth 292    Assessment: 1. SOB/ cough/ fevers/ wheezing - virus screen + for metapneumovirus which causes a flu-like illness. Still coughing/ wheezing and moderately ill.   2. Hypoxemia - CXR w IS pattern , tried to pull fluid yest with HD and pt didn't tolerate, this is like a viral pneumonitis pattern.    3. Anemia =- not on ESA 4. MBD of CKD cont meds 5. Volume - losing wt by intention and is 10 kg under prior dry wt w/ no edema on exam.  Will lower edw to around 120kg at time of discharge  Plan - would recommend keep pt in hospital another day or so, watch fevers curve, dc in 24-48 hrs if remains afebrile.  Will plan HD here tomorrow.     Kelly Splinter MD [facility] pager [phone]   05/07/2016, 12:35 PM    Recent Labs Lab 05/06/16 1148 05/07/16 0415  NA 138 136  K 5.0 4.1  CL 97* 95*  CO2 27 29  GLUCOSE 112* 105*  BUN 38* 20  CREATININE 10.65* 6.91*  CALCIUM 8.9 8.6*  PHOS 6.8*  --     Recent Labs Lab 05/06/16 1148 05/07/16 0415  AST 34 39  ALT 34 32  ALKPHOS 80 80  BILITOT 0.8 0.8  PROT 7.4 7.0  ALBUMIN 3.6 3.3*    Recent Labs Lab 05/06/16 1148 05/07/16 0415  WBC 6.8 6.6  NEUTROABS 4.2  --   HGB 10.2* 10.4*  HCT 32.2* 33.1*  MCV 96.4 95.7  PLT 248 222   Iron/TIBC/Ferritin/ %Sat    Component Value Date/Time   IRON 62 01/03/2015 1650   TIBC 290 01/03/2015 1650   FERRITIN 286 01/03/2015 1650  IRONPCTSAT 21 01/03/2015 1650

## 2016-05-07 NOTE — Progress Notes (Signed)
PROGRESS NOTE    Brenda Davis  EHM:[id number] DOB: 08-08-1965 DOA: 05/06/2016 PCP: Scarlette Calico, MD    Brief Narrative:  Patient is a 51 year old female history of chronic kidney disease stage IV requiring hemodialysis Monday Wednesday Friday, hyperlipidemia, anemia of chronic disease presented to the ED with acute respiratory failure felt likely secondary to prolonged healthcare associated pneumonia versus a viral pneumonitis in the setting of volume overload.   Assessment & Plan:   Principal Problem:   HCAP (healthcare-associated pneumonia) Active Problems:   Hyperlipidemia with target LDL less than 100   HYPERTENSION, BENIGN ESSENTIAL   CKD (chronic kidney disease) stage V requiring chronic dialysis (MWF)   Acute respiratory failure with hypoxia (HCC)   Anemia due to chronic kidney disease   Infection due to human metapneumovirus (hMPV)   #1 acute respiratory failure likely secondary to probable healthcare associated pneumonia/viral pneumonitis with Metapneumovirus and probable volume overload Patient had presented with cough, hypoxia, fever for several days chest x-ray concerning for pneumonitis or possible viral etiology. Patient placed empirically on broad-spectrum antibiotics. Patient improving clinically however not at baseline. Respiratory viral panel positive for metapneumovirus. Patient currently afebrile. Patient with a normal white count. Sputum Gram stain and cultures pending. Blood cultures pending. Continue empiric IV vancomycin and IV cefepime. Place on scheduled nebulizers and Mucinex. Supportive care.  #2 end-stage renal disease on hemodialysis Monday Wednesday Friday Patient has been seen by nephrology. Patient for hemodialysis tomorrow.  #3 hypertension Blood pressure improved since admission. Continue Norvasc, Avapro,bystolic. Follow.  #4 hyperlipidemia Fasting lipid panel with a total cholesterol of 201, LDL of 125. Lifestyle modification.  Outpatient follow-up.   #5 anemia of chronic disease Follow H&H.     DVT prophylaxis: Heparin Code Status: Full Family Communication: Updated patient. No family at bedside. Disposition Plan: Home when medically stable with clinical improvement hopefully in the next 24-48 hours and per nephrology.   Consultants:   Nephrology: Dr.Schertz 05/06/2016  Procedures:   Chest x-ray 05/06/2016  Antimicrobials:   IV cefepime 05/06/2016  IV vancomycin 05/06/2016   Subjective: Patient states cough improving. Patient states shortness of breath improving. Patient denies chest pain. Feeling better.  Objective: Vitals:   05/06/16 2104 05/06/16 2153 05/07/16 0426 05/07/16 1015  BP: (!) 170/84 (!) 165/88 (!) 149/68 135/72  Pulse: 84 90 97 92  Resp: 18 20 18 18   Temp: 98.7 F (37.1 C) 99 F (37.2 C) 98.7 F (37.1 C) 98.5 F (36.9 C)  TempSrc: Oral Oral Oral Oral  SpO2: 99% 97% 97% 98%  Weight: 120.1 kg (264 lb 12.4 oz)       Intake/Output Summary (Last 24 hours) at 05/07/16 1357 Last data filed at 05/07/16 1100  Gross per 24 hour  Intake              290 ml  Output             2000 ml  Net            -1710 ml   Filed Weights   05/06/16 1734 05/06/16 2104  Weight: 122.1 kg (269 lb 2.9 oz) 120.1 kg (264 lb 12.4 oz)    Examination:  General exam: Appears calm and comfortable  Respiratory system: Clear to auscultation. Respiratory effort normal. Cardiovascular system: S1 & S2 heard, RRR. No JVD, murmurs, rubs, gallops or clicks. No pedal edema. Gastrointestinal system: Abdomen is nondistended, soft and nontender. No organomegaly or masses felt. Normal bowel sounds heard. Central nervous system: Alert and oriented.  No focal neurological deficits. Extremities: Symmetric 5 x 5 power. Skin: No rashes, lesions or ulcers Psychiatry: Judgement and insight appear normal. Mood & affect appropriate.     Data Reviewed: I have personally reviewed following labs and imaging  studies  CBC:  Recent Labs Lab 05/06/16 1148 05/07/16 0415  WBC 6.8 6.6  NEUTROABS 4.2  --   HGB 10.2* 10.4*  HCT 32.2* 33.1*  MCV 96.4 95.7  PLT 248 462   Basic Metabolic Panel:  Recent Labs Lab 05/06/16 1148 05/07/16 0415  NA 138 136  K 5.0 4.1  CL 97* 95*  CO2 27 29  GLUCOSE 112* 105*  BUN 38* 20  CREATININE 10.65* 6.91*  CALCIUM 8.9 8.6*  MG 2.3  --   PHOS 6.8*  --    GFR: Estimated Creatinine Clearance: 12.5 mL/min (A) (by C-G formula based on SCr of 6.91 mg/dL (H)). Liver Function Tests:  Recent Labs Lab 05/06/16 1148 05/07/16 0415  AST 34 39  ALT 34 32  ALKPHOS 80 80  BILITOT 0.8 0.8  PROT 7.4 7.0  ALBUMIN 3.6 3.3*   No results for input(s): LIPASE, AMYLASE in the last 168 hours. No results for input(s): AMMONIA in the last 168 hours. Coagulation Profile:  Recent Labs Lab 05/06/16 1148  INR 1.00   Cardiac Enzymes: No results for input(s): CKTOTAL, CKMB, CKMBINDEX, TROPONINI in the last 168 hours. BNP (last 3 results) No results for input(s): PROBNP in the last 8760 hours. HbA1C: No results for input(s): HGBA1C in the last 72 hours. CBG: No results for input(s): GLUCAP in the last 168 hours. Lipid Profile: No results for input(s): CHOL, HDL, LDLCALC, TRIG, CHOLHDL, LDLDIRECT in the last 72 hours. Thyroid Function Tests: No results for input(s): TSH, T4TOTAL, FREET4, T3FREE, THYROIDAB in the last 72 hours. Anemia Panel: No results for input(s): VITAMINB12, FOLATE, FERRITIN, TIBC, IRON, RETICCTPCT in the last 72 hours. Sepsis Labs:  Recent Labs Lab 05/06/16 1207 05/06/16 1445  LATICACIDVEN 1.57 0.80    Recent Results (from the past 240 hour(s))  Culture, blood (Routine x 2)     Status: None (Preliminary result)   Collection Time: 05/06/16 11:49 AM  Result Value Ref Range Status   Specimen Description BLOOD LEFT ANTECUBITAL  Final   Special Requests   Final    BOTTLES DRAWN AEROBIC AND ANAEROBIC Blood Culture adequate volume    Culture NO GROWTH < 24 HOURS  Final   Report Status PENDING  Incomplete  Culture, blood (Routine x 2)     Status: None (Preliminary result)   Collection Time: 05/06/16 12:30 PM  Result Value Ref Range Status   Specimen Description BLOOD RIGHT ANTECUBITAL  Final   Special Requests   Final    BOTTLES DRAWN AEROBIC AND ANAEROBIC Blood Culture adequate volume   Culture NO GROWTH < 24 HOURS  Final   Report Status PENDING  Incomplete  Respiratory Panel by PCR     Status: Abnormal   Collection Time: 05/06/16  2:33 PM  Result Value Ref Range Status   Adenovirus NOT DETECTED NOT DETECTED Final   Coronavirus 229E NOT DETECTED NOT DETECTED Final   Coronavirus HKU1 NOT DETECTED NOT DETECTED Final   Coronavirus NL63 NOT DETECTED NOT DETECTED Final   Coronavirus OC43 NOT DETECTED NOT DETECTED Final   Metapneumovirus DETECTED (A) NOT DETECTED Final   Rhinovirus / Enterovirus NOT DETECTED NOT DETECTED Final   Influenza A NOT DETECTED NOT DETECTED Final   Influenza B NOT DETECTED NOT DETECTED  Final   Parainfluenza Virus 1 NOT DETECTED NOT DETECTED Final   Parainfluenza Virus 2 NOT DETECTED NOT DETECTED Final   Parainfluenza Virus 3 NOT DETECTED NOT DETECTED Final   Parainfluenza Virus 4 NOT DETECTED NOT DETECTED Final   Respiratory Syncytial Virus NOT DETECTED NOT DETECTED Final   Bordetella pertussis NOT DETECTED NOT DETECTED Final   Chlamydophila pneumoniae NOT DETECTED NOT DETECTED Final   Mycoplasma pneumoniae NOT DETECTED NOT DETECTED Final  Culture, sputum-assessment     Status: None   Collection Time: 05/07/16  5:09 AM  Result Value Ref Range Status   Specimen Description EXPECTORATED SPUTUM  Final   Special Requests NONE  Final   Sputum evaluation THIS SPECIMEN IS ACCEPTABLE FOR SPUTUM CULTURE  Final   Report Status 05/07/2016 FINAL  Final  MRSA PCR Screening     Status: None   Collection Time: 05/07/16  5:09 AM  Result Value Ref Range Status   MRSA by PCR NEGATIVE NEGATIVE Final     Comment:        The GeneXpert MRSA Assay (FDA approved for NASAL specimens only), is one component of a comprehensive MRSA colonization surveillance program. It is not intended to diagnose MRSA infection nor to guide or monitor treatment for MRSA infections.          Radiology Studies: Dg Chest 2 View  Result Date: 05/06/2016 CLINICAL DATA:  Fevers, chills, body aches, chest congestion EXAM: CHEST  2 VIEW COMPARISON:  05/04/2016, 12/05/2015, 01/23/2015 FINDINGS: There is mild bilateral interstitial prominence. There is no focal consolidation. There is no pleural effusion or pneumothorax. The heart and mediastinal contours are unremarkable. The osseous structures are unremarkable. IMPRESSION: Cardiomegaly with bilateral mild interstitial prominence which may reflect pulmonary vascular congestion versus interstitial infection. Electronically Signed   By: Kathreen Devoid   On: 05/06/2016 11:43        Scheduled Meds: . amLODipine  10 mg Oral Daily  . calcium acetate  1,334 mg Oral TID WC  . guaiFENesin  1,200 mg Oral BID  . heparin  5,000 Units Subcutaneous Q8H  . ipratropium-albuterol  3 mL Nebulization Q6H  . irbesartan  300 mg Oral QHS  . multivitamin  1 tablet Oral QHS  . nebivolol  10 mg Oral BID  . pantoprazole  40 mg Oral Daily   Continuous Infusions: . ceFEPime (MAXIPIME) IV 1 g (05/06/16 1511)  . [START ON 05/08/2016] vancomycin Stopped (05/06/16 2117)     LOS: 1 day    Time spent: 57 minutes  THOMPSON,DANIEL, MD [facility] Pager [phone]  If 7PM-7AM, please contact night-coverage [email or web address] Password [email or web address] 05/07/2016, 1:57 PM

## 2016-05-08 ENCOUNTER — Inpatient Hospital Stay (HOSPITAL_COMMUNITY): Payer: 59

## 2016-05-08 DIAGNOSIS — R509 Fever, unspecified: Secondary | ICD-10-CM

## 2016-05-08 LAB — CBC WITH DIFFERENTIAL/PLATELET
Basophils Absolute: 0 10*3/uL (ref 0.0–0.1)
Basophils Relative: 0 %
Eosinophils Absolute: 0.8 10*3/uL — ABNORMAL HIGH (ref 0.0–0.7)
Eosinophils Relative: 11 %
HCT: 31.7 % — ABNORMAL LOW (ref 36.0–46.0)
Hemoglobin: 9.9 g/dL — ABNORMAL LOW (ref 12.0–15.0)
Lymphocytes Relative: 33 %
Lymphs Abs: 2.2 10*3/uL (ref 0.7–4.0)
MCH: 29.8 pg (ref 26.0–34.0)
MCHC: 31.2 g/dL (ref 30.0–36.0)
MCV: 95.5 fL (ref 78.0–100.0)
Monocytes Absolute: 0.7 10*3/uL (ref 0.1–1.0)
Monocytes Relative: 10 %
Neutro Abs: 3 10*3/uL (ref 1.7–7.7)
Neutrophils Relative %: 46 %
Platelets: 237 10*3/uL (ref 150–400)
RBC: 3.32 MIL/uL — ABNORMAL LOW (ref 3.87–5.11)
RDW: 14 % (ref 11.5–15.5)
WBC: 6.6 10*3/uL (ref 4.0–10.5)

## 2016-05-08 LAB — RENAL FUNCTION PANEL
Albumin: 2.9 g/dL — ABNORMAL LOW (ref 3.5–5.0)
Anion gap: 13 (ref 5–15)
BUN: 35 mg/dL — ABNORMAL HIGH (ref 6–20)
CO2: 25 mmol/L (ref 22–32)
Calcium: 8 mg/dL — ABNORMAL LOW (ref 8.9–10.3)
Chloride: 94 mmol/L — ABNORMAL LOW (ref 101–111)
Creatinine, Ser: 10.01 mg/dL — ABNORMAL HIGH (ref 0.44–1.00)
GFR calc Af Amer: 5 mL/min — ABNORMAL LOW (ref >60)
GFR calc non Af Amer: 4 mL/min — ABNORMAL LOW (ref >60)
Glucose, Bld: 98 mg/dL (ref 65–99)
Phosphorus: 5.8 mg/dL — ABNORMAL HIGH (ref 2.5–4.6)
Potassium: 4.6 mmol/L (ref 3.5–5.1)
Sodium: 132 mmol/L — ABNORMAL LOW (ref 135–145)

## 2016-05-08 LAB — LEGIONELLA PNEUMOPHILA SEROGP 1 UR AG: L. pneumophila Serogp 1 Ur Ag: NEGATIVE

## 2016-05-08 MED ORDER — PANTOPRAZOLE SODIUM 40 MG PO TBEC: 40.0000 mg | DELAYED_RELEASE_TABLET | Freq: Every day | ORAL | Status: DC

## 2016-05-08 MED ORDER — POLYETHYLENE GLYCOL 3350 17 G PO PACK: 17.0000 g | PACK | Freq: Two times a day (BID) | ORAL | Status: DC

## 2016-05-08 MED ORDER — BISACODYL 5 MG PO TBEC
10.0000 mg | DELAYED_RELEASE_TABLET | Freq: Once | ORAL | Status: AC
  Administered 2016-05-08: 10 mg via ORAL
  Filled 2016-05-08: qty 2

## 2016-05-08 MED ORDER — IPRATROPIUM-ALBUTEROL 0.5-2.5 (3) MG/3ML IN SOLN
3.0000 mL | Freq: Three times a day (TID) | RESPIRATORY_TRACT | Status: DC
  Administered 2016-05-09 (×2): 3 mL via RESPIRATORY_TRACT
  Filled 2016-05-08 (×2): qty 3

## 2016-05-08 MED ORDER — METHYLPREDNISOLONE SODIUM SUCC 125 MG IJ SOLR
60.0000 mg | Freq: Three times a day (TID) | INTRAMUSCULAR | Status: DC
  Administered 2016-05-08 – 2016-05-09 (×4): 60 mg via INTRAVENOUS
  Filled 2016-05-08 (×4): qty 2

## 2016-05-08 MED ORDER — HEPARIN SODIUM (PORCINE) 1000 UNIT/ML DIALYSIS: 1000.0000 [IU] | INTRAMUSCULAR | Status: DC | PRN

## 2016-05-08 MED ORDER — LIDOCAINE HCL (PF) 1 % IJ SOLN
5.0000 mL | INTRAMUSCULAR | Status: DC | PRN
  Filled 2016-05-08: qty 5

## 2016-05-08 MED ORDER — LORATADINE 10 MG PO TABS
10.0000 mg | ORAL_TABLET | Freq: Every day | ORAL | Status: DC
  Administered 2016-05-08 – 2016-05-09 (×2): 10 mg via ORAL
  Filled 2016-05-08 (×2): qty 1

## 2016-05-08 MED ORDER — FLUTICASONE PROPIONATE 50 MCG/ACT NA SUSP
2.0000 | Freq: Every day | NASAL | Status: DC
  Administered 2016-05-08 – 2016-05-09 (×2): 2 via NASAL
  Filled 2016-05-08: qty 16

## 2016-05-08 MED ORDER — VANCOMYCIN HCL IN DEXTROSE 1-5 GM/200ML-% IV SOLN
INTRAVENOUS | Status: AC
  Filled 2016-05-08: qty 200

## 2016-05-08 MED ORDER — SODIUM CHLORIDE 0.9 % IV SOLN: 100.0000 mL | INTRAVENOUS | Status: DC | PRN

## 2016-05-08 MED ORDER — BUDESONIDE 0.5 MG/2ML IN SUSP
0.5000 mg | Freq: Two times a day (BID) | RESPIRATORY_TRACT | Status: DC
  Administered 2016-05-08 – 2016-05-09 (×2): 0.5 mg via RESPIRATORY_TRACT
  Filled 2016-05-08 (×2): qty 2

## 2016-05-08 MED ORDER — GUAIFENESIN-DM 100-10 MG/5ML PO SYRP: 5.0000 mL | ORAL_SOLUTION | ORAL | Status: DC | PRN

## 2016-05-08 MED ORDER — IPRATROPIUM-ALBUTEROL 0.5-2.5 (3) MG/3ML IN SOLN
3.0000 mL | Freq: Four times a day (QID) | RESPIRATORY_TRACT | Status: DC
  Administered 2016-05-08: 3 mL via RESPIRATORY_TRACT
  Filled 2016-05-08 (×2): qty 3

## 2016-05-08 MED ORDER — PENTAFLUOROPROP-TETRAFLUOROETH EX AERO: 1.0000 "application " | INHALATION_SPRAY | CUTANEOUS | Status: DC | PRN

## 2016-05-08 MED ORDER — LIDOCAINE-PRILOCAINE 2.5-2.5 % EX CREA: 1.0000 "application " | TOPICAL_CREAM | CUTANEOUS | Status: DC | PRN

## 2016-05-08 MED ORDER — HYDROCODONE-HOMATROPINE 5-1.5 MG/5ML PO SYRP
5.0000 mL | ORAL_SOLUTION | Freq: Every evening | ORAL | Status: DC | PRN
  Administered 2016-05-08: 5 mL via ORAL
  Filled 2016-05-08: qty 5

## 2016-05-08 MED ORDER — ALTEPLASE 2 MG IJ SOLR: 2.0000 mg | Freq: Once | INTRAMUSCULAR | Status: DC | PRN

## 2016-05-08 MED ORDER — SORBITOL 70 % SOLN
30.0000 mL | Freq: Once | Status: AC
  Administered 2016-05-08: 30 mL via ORAL
  Filled 2016-05-08: qty 30

## 2016-05-08 MED ORDER — HEPARIN SODIUM (PORCINE) 1000 UNIT/ML DIALYSIS
4000.0000 [IU] | Freq: Once | INTRAMUSCULAR | Status: AC
  Administered 2016-05-08: 4000 [IU] via INTRAVENOUS_CENTRAL

## 2016-05-08 NOTE — Progress Notes (Signed)
PROGRESS NOTE    Brenda Davis  QQP:[id number] DOB: [birth year] DOA: 05/06/2016 PCP: Scarlette Calico, MD    Brief Narrative:  Patient is a 51 year old female history of chronic kidney disease stage IV requiring hemodialysis Monday Wednesday Friday, hyperlipidemia, anemia of chronic disease presented to the ED with acute respiratory failure felt likely secondary to prolonged healthcare associated pneumonia versus a viral pneumonitis in the setting of volume overload.   Assessment & Plan:   Principal Problem:   HCAP (healthcare-associated pneumonia) Active Problems:   Hyperlipidemia with target LDL less than 100   HYPERTENSION, BENIGN ESSENTIAL   CKD (chronic kidney disease) stage V requiring chronic dialysis (MWF)   Acute respiratory failure with hypoxia (HCC)   Anemia due to chronic kidney disease   Infection due to human metapneumovirus (hMPV)   #1 acute respiratory failure likely secondary to probable healthcare associated pneumonia/viral pneumonitis with Metapneumovirus and probable volume overload Patient had presented with cough, hypoxia, fever for several days chest x-ray concerning for pneumonitis or possible viral etiology. Patient placed empirically on broad-spectrum antibiotics. Patient noted during hemodialysis to be wheezing, worsening shortness of breath with diffuse rhonchi. Respiratory viral panel positive for metapneumovirus. Patient currently afebrile. Patient with a normal white count. Sputum Gram stain and cultures pending. Blood cultures pending. Patient with a history of tobacco abuse. Will place on IV Solu-Medrol 60 mg every 6 hours, Flonase, Claritin, PPI, scheduled DuoNeb nebs, Pulmicort. Continue empiric IV cefepime, Mucinex. DC IV vancomycin. Supportive care.  #2 end-stage renal disease on hemodialysis Monday Wednesday Friday Patient has been seen by nephrology. Patient for hemodialysis today.  #3 hypertension Blood pressure improved since admission.  Continue Norvasc, Avapro,bystolic. Follow.  #4 hyperlipidemia Fasting lipid panel with a total cholesterol of 201, LDL of 125. Lifestyle modification. Outpatient follow-up.   #5 anemia of chronic disease Follow H&H.     DVT prophylaxis: Heparin Code Status: Full Family Communication: Updated patient and family at bedside. Disposition Plan: Home when medically stable with clinical improvement hopefully in the next 24-48 hours and per nephrology.   Consultants:   Nephrology: Dr.Schertz 05/06/2016  Procedures:   Chest x-ray 05/06/2016  Antimicrobials:   IV cefepime 05/06/2016  IV vancomycin 05/06/2016>>>>> 05/08/2016   Subjective: Patient states had some shortness of breath with coughing and wheezing during hemodialysis however feeling a little bit better. Patient also complaining of constipation  Objective: Vitals:   05/08/16 1000 05/08/16 1030 05/08/16 1100 05/08/16 1219  BP: 102/70 (!) 98/54 114/69 123/67  Pulse: 64 62 62 67  Resp:    19  Temp:    98.6 F (37 C)  TempSrc:    Oral  SpO2:    100%  Weight:        Intake/Output Summary (Last 24 hours) at 05/08/16 1342 Last data filed at 05/08/16 1000  Gross per 24 hour  Intake              360 ml  Output                0 ml  Net              360 ml   Filed Weights   05/06/16 2104 05/07/16 2236 05/08/16 0755  Weight: 120.1 kg (264 lb 12.4 oz) 120.3 kg (265 lb 3.4 oz) 129.4 kg (285 lb 4.4 oz)    Examination:  General exam: Appears calm and comfortable  Respiratory system: coarse diffuse rhonchorous breath sounds.Wheezing. Some tightness. Respiratory effort normal. Cardiovascular system: S1 &  S2 heard, RRR. No JVD, murmurs, rubs, gallops or clicks. No pedal edema. Gastrointestinal system: Abdomen is nondistended, soft and nontender. No organomegaly or masses felt. Normal bowel sounds heard. Central nervous system: Alert and oriented. No focal neurological deficits. Extremities: Symmetric 5 x 5 power. Skin:  No rashes, lesions or ulcers Psychiatry: Judgement and insight appear normal. Mood & affect appropriate.     Data Reviewed: I have personally reviewed following labs and imaging studies  CBC:  Recent Labs Lab 05/06/16 1148 05/07/16 0415 05/08/16 0509  WBC 6.8 6.6 6.6  NEUTROABS 4.2  --  3.0  HGB 10.2* 10.4* 9.9*  HCT 32.2* 33.1* 31.7*  MCV 96.4 95.7 95.5  PLT 248 222 329   Basic Metabolic Panel:  Recent Labs Lab 05/06/16 1148 05/07/16 0415 05/08/16 0509  NA 138 136 132*  K 5.0 4.1 4.6  CL 97* 95* 94*  CO2 27 29 25   GLUCOSE 112* 105* 98  BUN 38* 20 35*  CREATININE 10.65* 6.91* 10.01*  CALCIUM 8.9 8.6* 8.0*  MG 2.3  --   --   PHOS 6.8*  --  5.8*   GFR: Estimated Creatinine Clearance: 9 mL/min (A) (by C-G formula based on SCr of 10.01 mg/dL (H)). Liver Function Tests:  Recent Labs Lab 05/06/16 1148 05/07/16 0415 05/08/16 0509  AST 34 39  --   ALT 34 32  --   ALKPHOS 80 80  --   BILITOT 0.8 0.8  --   PROT 7.4 7.0  --   ALBUMIN 3.6 3.3* 2.9*   No results for input(s): LIPASE, AMYLASE in the last 168 hours. No results for input(s): AMMONIA in the last 168 hours. Coagulation Profile:  Recent Labs Lab 05/06/16 1148  INR 1.00   Cardiac Enzymes: No results for input(s): CKTOTAL, CKMB, CKMBINDEX, TROPONINI in the last 168 hours. BNP (last 3 results) No results for input(s): PROBNP in the last 8760 hours. HbA1C: No results for input(s): HGBA1C in the last 72 hours. CBG: No results for input(s): GLUCAP in the last 168 hours. Lipid Profile: No results for input(s): CHOL, HDL, LDLCALC, TRIG, CHOLHDL, LDLDIRECT in the last 72 hours. Thyroid Function Tests: No results for input(s): TSH, T4TOTAL, FREET4, T3FREE, THYROIDAB in the last 72 hours. Anemia Panel: No results for input(s): VITAMINB12, FOLATE, FERRITIN, TIBC, IRON, RETICCTPCT in the last 72 hours. Sepsis Labs:  Recent Labs Lab 05/06/16 1207 05/06/16 1445  LATICACIDVEN 1.57 0.80    Recent  Results (from the past 240 hour(s))  Culture, blood (Routine x 2)     Status: None (Preliminary result)   Collection Time: 05/06/16 11:49 AM  Result Value Ref Range Status   Specimen Description BLOOD LEFT ANTECUBITAL  Final   Special Requests   Final    BOTTLES DRAWN AEROBIC AND ANAEROBIC Blood Culture adequate volume   Culture NO GROWTH < 24 HOURS  Final   Report Status PENDING  Incomplete  Culture, blood (Routine x 2)     Status: None (Preliminary result)   Collection Time: 05/06/16 12:30 PM  Result Value Ref Range Status   Specimen Description BLOOD RIGHT ANTECUBITAL  Final   Special Requests   Final    BOTTLES DRAWN AEROBIC AND ANAEROBIC Blood Culture adequate volume   Culture NO GROWTH < 24 HOURS  Final   Report Status PENDING  Incomplete  Respiratory Panel by PCR     Status: Abnormal   Collection Time: 05/06/16  2:33 PM  Result Value Ref Range Status   Adenovirus  NOT DETECTED NOT DETECTED Final   Coronavirus 229E NOT DETECTED NOT DETECTED Final   Coronavirus HKU1 NOT DETECTED NOT DETECTED Final   Coronavirus NL63 NOT DETECTED NOT DETECTED Final   Coronavirus OC43 NOT DETECTED NOT DETECTED Final   Metapneumovirus DETECTED (A) NOT DETECTED Final   Rhinovirus / Enterovirus NOT DETECTED NOT DETECTED Final   Influenza A NOT DETECTED NOT DETECTED Final   Influenza B NOT DETECTED NOT DETECTED Final   Parainfluenza Virus 1 NOT DETECTED NOT DETECTED Final   Parainfluenza Virus 2 NOT DETECTED NOT DETECTED Final   Parainfluenza Virus 3 NOT DETECTED NOT DETECTED Final   Parainfluenza Virus 4 NOT DETECTED NOT DETECTED Final   Respiratory Syncytial Virus NOT DETECTED NOT DETECTED Final   Bordetella pertussis NOT DETECTED NOT DETECTED Final   Chlamydophila pneumoniae NOT DETECTED NOT DETECTED Final   Mycoplasma pneumoniae NOT DETECTED NOT DETECTED Final  Culture, sputum-assessment     Status: None   Collection Time: 05/07/16  5:09 AM  Result Value Ref Range Status   Specimen  Description EXPECTORATED SPUTUM  Final   Special Requests NONE  Final   Sputum evaluation THIS SPECIMEN IS ACCEPTABLE FOR SPUTUM CULTURE  Final   Report Status 05/07/2016 FINAL  Final  MRSA PCR Screening     Status: None   Collection Time: 05/07/16  5:09 AM  Result Value Ref Range Status   MRSA by PCR NEGATIVE NEGATIVE Final    Comment:        The GeneXpert MRSA Assay (FDA approved for NASAL specimens only), is one component of a comprehensive MRSA colonization surveillance program. It is not intended to diagnose MRSA infection nor to guide or monitor treatment for MRSA infections.   Culture, respiratory (NON-Expectorated)     Status: None (Preliminary result)   Collection Time: 05/07/16  5:09 AM  Result Value Ref Range Status   Specimen Description EXPECTORATED SPUTUM  Final   Special Requests NONE Reflexed from [id number]  Final   Gram Stain   Final    FEW WBC PRESENT, PREDOMINANTLY MONONUCLEAR MODERATE GRAM POSITIVE COCCI IN PAIRS FEW GRAM POSITIVE COCCI IN CLUSTERS FEW GRAM NEGATIVE RODS FEW GRAM NEGATIVE COCCOBACILLI    Culture TOO YOUNG TO READ  Final   Report Status PENDING  Incomplete         Radiology Studies: No results found.      Scheduled Meds: . amLODipine  10 mg Oral Daily  . bisacodyl  10 mg Oral Once  . budesonide (PULMICORT) nebulizer solution  0.5 mg Nebulization BID  . calcium acetate  1,334 mg Oral TID WC  . guaiFENesin  1,200 mg Oral BID  . heparin  5,000 Units Subcutaneous Q8H  . ipratropium-albuterol  3 mL Nebulization Q6H  . irbesartan  300 mg Oral QHS  . loratadine  10 mg Oral Daily  . methylPREDNISolone (SOLU-MEDROL) injection  60 mg Intravenous Q8H  . multivitamin  1 tablet Oral QHS  . nebivolol  10 mg Oral BID  . pantoprazole  40 mg Oral Daily  . sorbitol  30 mL Oral Once   Continuous Infusions: . ceFEPime (MAXIPIME) IV Stopped (05/07/16 1807)  . vancomycin 1,000 mg (05/08/16 1050)     LOS: 2 days    Time spent: 77  minutes  Renly Roots, MD [facility] Pager [phone]  If 7PM-7AM, please contact night-coverage [email or web address] Password [email or web address] 05/08/2016, 1:42 PM

## 2016-05-08 NOTE — Progress Notes (Signed)
[facility] Progress Note   Subjective: still SOB , coughing and wheezing.  BP's dropping on HD with attempted 3L UF, have turned off UF and given fluid back.    Vitals:   05/08/16 0930 05/08/16 1000 05/08/16 1030 05/08/16 1100  BP: 109/73 102/70 (!) 98/54 114/69  Pulse: 66 64 62 62  Resp:      Temp:      TempSrc:      SpO2:      Weight:        Inpatient medications: . amLODipine  10 mg Oral Daily  . calcium acetate  1,334 mg Oral TID WC  . guaiFENesin  1,200 mg Oral BID  . heparin  5,000 Units Subcutaneous Q8H  . ipratropium-albuterol  3 mL Nebulization BID  . irbesartan  300 mg Oral QHS  . multivitamin  1 tablet Oral QHS  . nebivolol  10 mg Oral BID  . pantoprazole  40 mg Oral Daily   . sodium chloride    . sodium chloride    . ceFEPime (MAXIPIME) IV Stopped (05/07/16 1807)  . vancomycin 1,000 mg (05/08/16 1050)   sodium chloride, sodium chloride, acetaminophen **OR** acetaminophen, alteplase, calcium carbonate (dosed in mg elemental calcium), camphor-menthol **AND** hydrOXYzine, docusate sodium, feeding supplement (NEPRO CARB STEADY), guaiFENesin-dextromethorphan, heparin, HYDROcodone-homatropine, ipratropium-albuterol, lidocaine (PF), lidocaine-prilocaine, ondansetron **OR** ondansetron (ZOFRAN) IV, pentafluoroprop-tetrafluoroeth, sorbitol, zolpidem  Exam: Gen no sig ^WOB, no distress, lying at 30deg Sclera anicteric, throat clear  No jvd or bruits Chest diffuse mild coarse exp wheezing, no rales or bronch BS RRR no MRG Abd soft ntnd no mass or ascites +bs obese Ext no sig LE edema / no wounds or ulcers Neuro is alert, Ox 3 , nf LUA AVF+bruit    Dialysis: [facility]  131.5kg   2/2.25 bath  P4  Hep 4000  LUA AVF - Mircera 50 every 4 wks, last 3/23 - no Fe, ESA - last Hb 10.7, Ca 9.6, P 5.7, pth 292    Assessment: 1. SOB/ cough/ fevers/ wheezing - virus screen + for metapneumovirus which causes a flu-like illness. Still coughing/  wheezing and moderately ill.   2. Hypoxemia - CXR w IS pattern, not tolerating fluid removal attempts on HD, CXR findings may be due to viral infection.   3. Anemia =- not on ESA 4. MBD of CKD cont meds 5. Volume - bed wts and standing wts are far apart.  Trust standing wt done today more (129kg), under dry wt and not tolerating UF on HD.       Plan - HD today, min UF, repeat CXR tonight  Kelly Splinter MD [place] pager [phone]   05/08/2016, 11:57 AM    Recent Labs Lab 05/06/16 1148 05/07/16 0415 05/08/16 0509  NA 138 136 132*  K 5.0 4.1 4.6  CL 97* 95* 94*  CO2 27 29 25   GLUCOSE 112* 105* 98  BUN 38* 20 35*  CREATININE 10.65* 6.91* 10.01*  CALCIUM 8.9 8.6* 8.0*  PHOS 6.8*  --  5.8*    Recent Labs Lab 05/06/16 1148 05/07/16 0415 05/08/16 0509  AST 34 39  --   ALT 34 32  --   ALKPHOS 80 80  --   BILITOT 0.8 0.8  --   PROT 7.4 7.0  --   ALBUMIN 3.6 3.3* 2.9*    Recent Labs Lab 05/06/16 1148 05/07/16 0415 05/08/16 0509  WBC 6.8 6.6 6.6  NEUTROABS 4.2  --  3.0  HGB  10.2* 10.4* 9.9*  HCT 32.2* 33.1* 31.7*  MCV 96.4 95.7 95.5  PLT 248 222 237   Iron/TIBC/Ferritin/ %Sat    Component Value Date/Time   IRON 62 01/03/2015 1650   TIBC 290 01/03/2015 1650   FERRITIN 286 01/03/2015 1650   IRONPCTSAT 21 01/03/2015 1650

## 2016-05-08 NOTE — Progress Notes (Addendum)
Respiratory Therapy notified to administer ipratropium-albuterol nebulizer.   Wynema Birch, RN

## 2016-05-08 NOTE — Progress Notes (Signed)
Patient requesting robitussin.  MD notified.

## 2016-05-09 DIAGNOSIS — R0600 Dyspnea, unspecified: Secondary | ICD-10-CM

## 2016-05-09 LAB — CBC WITH DIFFERENTIAL/PLATELET
Basophils Absolute: 0 10*3/uL (ref 0.0–0.1)
Basophils Relative: 0 %
Eosinophils Absolute: 0 10*3/uL (ref 0.0–0.7)
Eosinophils Relative: 0 %
HCT: 30.8 % — ABNORMAL LOW (ref 36.0–46.0)
Hemoglobin: 10.2 g/dL — ABNORMAL LOW (ref 12.0–15.0)
Lymphocytes Relative: 28 %
Lymphs Abs: 1.5 10*3/uL (ref 0.7–4.0)
MCH: 30.9 pg (ref 26.0–34.0)
MCHC: 33.1 g/dL (ref 30.0–36.0)
MCV: 93.3 fL (ref 78.0–100.0)
Monocytes Absolute: 0.2 10*3/uL (ref 0.1–1.0)
Monocytes Relative: 4 %
Neutro Abs: 3.6 10*3/uL (ref 1.7–7.7)
Neutrophils Relative %: 68 %
Platelets: 268 10*3/uL (ref 150–400)
RBC: 3.3 MIL/uL — ABNORMAL LOW (ref 3.87–5.11)
RDW: 14 % (ref 11.5–15.5)
WBC: 5.3 10*3/uL (ref 4.0–10.5)

## 2016-05-09 LAB — CULTURE, RESPIRATORY W GRAM STAIN: Culture: NORMAL

## 2016-05-09 LAB — RENAL FUNCTION PANEL
Albumin: 3.1 g/dL — ABNORMAL LOW (ref 3.5–5.0)
Anion gap: 15 (ref 5–15)
BUN: 28 mg/dL — ABNORMAL HIGH (ref 6–20)
CO2: 24 mmol/L (ref 22–32)
Calcium: 8.5 mg/dL — ABNORMAL LOW (ref 8.9–10.3)
Chloride: 94 mmol/L — ABNORMAL LOW (ref 101–111)
Creatinine, Ser: 8.26 mg/dL — ABNORMAL HIGH (ref 0.44–1.00)
GFR calc Af Amer: 6 mL/min — ABNORMAL LOW (ref >60)
GFR calc non Af Amer: 5 mL/min — ABNORMAL LOW (ref >60)
Glucose, Bld: 187 mg/dL — ABNORMAL HIGH (ref 65–99)
Phosphorus: 5.2 mg/dL — ABNORMAL HIGH (ref 2.5–4.6)
Potassium: 4.2 mmol/L (ref 3.5–5.1)
Sodium: 133 mmol/L — ABNORMAL LOW (ref 135–145)

## 2016-05-09 MED ORDER — ALBUTEROL SULFATE HFA 108 (90 BASE) MCG/ACT IN AERS: 2.0000 | INHALATION_SPRAY | Freq: Four times a day (QID) | RESPIRATORY_TRACT | 1 refills | Status: DC | PRN

## 2016-05-09 MED ORDER — GUAIFENESIN ER 600 MG PO TB12: 1200.0000 mg | ORAL_TABLET | Freq: Two times a day (BID) | ORAL | 0 refills | Status: DC

## 2016-05-09 MED ORDER — LORATADINE 10 MG PO TABS: 10.0000 mg | ORAL_TABLET | Freq: Every day | ORAL | Status: DC

## 2016-05-09 MED ORDER — PREDNISONE 20 MG PO TABS: 20.0000 mg | ORAL_TABLET | Freq: Every day | ORAL | 0 refills | Status: DC

## 2016-05-09 MED ORDER — PANTOPRAZOLE SODIUM 40 MG PO TBEC: 40.0000 mg | DELAYED_RELEASE_TABLET | Freq: Every day | ORAL | 1 refills | Status: DC

## 2016-05-09 MED ORDER — FLUTICASONE PROPIONATE 50 MCG/ACT NA SUSP: 2.0000 | Freq: Every day | NASAL | 0 refills | Status: DC

## 2016-05-09 MED ORDER — AMOXICILLIN-POT CLAVULANATE 500-125 MG PO TABS: 1.0000 | ORAL_TABLET | Freq: Every day | ORAL | 0 refills | Status: DC

## 2016-05-09 MED ORDER — POLYETHYLENE GLYCOL 3350 17 G PO PACK: 17.0000 g | PACK | Freq: Every day | ORAL | 0 refills | Status: DC | PRN

## 2016-05-09 MED ORDER — GUAIFENESIN-DM 100-10 MG/5ML PO SYRP: 5.0000 mL | ORAL_SOLUTION | ORAL | 0 refills | Status: DC | PRN

## 2016-05-09 MED ORDER — NEBIVOLOL HCL 5 MG PO TABS: 10.0000 mg | ORAL_TABLET | Freq: Two times a day (BID) | ORAL | Status: DC

## 2016-05-09 NOTE — Progress Notes (Signed)
[facility] Progress Note   Subjective: feeling a little better, took laxative and had good BM, eating some now, getting some phlegm up with cough now   Vitals:   05/08/16 2121 05/09/16 0622 05/09/16 0828 05/09/16 0915  BP: (!) 148/70 136/69  (!) 161/79  Pulse: 75 61  66  Resp: 18 16  20   Temp: 99.2 F (37.3 C) 97.9 F (36.6 C)  98.6 F (37 C)  TempSrc: Oral Oral  Oral  SpO2: 91% 100% 96% 99%  Weight: 126.6 kg (279 lb 3.2 oz)     Height:        Inpatient medications: . amLODipine  10 mg Oral Daily  . budesonide (PULMICORT) nebulizer solution  0.5 mg Nebulization BID  . calcium acetate  1,334 mg Oral TID WC  . fluticasone  2 spray Each Nare Daily  . guaiFENesin  1,200 mg Oral BID  . heparin  5,000 Units Subcutaneous Q8H  . ipratropium-albuterol  3 mL Nebulization TID  . irbesartan  300 mg Oral QHS  . loratadine  10 mg Oral Daily  . methylPREDNISolone (SOLU-MEDROL) injection  60 mg Intravenous Q8H  . multivitamin  1 tablet Oral QHS  . nebivolol  10 mg Oral BID  . pantoprazole  40 mg Oral Daily  . polyethylene glycol  17 g Oral BID   . ceFEPime (MAXIPIME) IV Stopped (05/08/16 1858)   acetaminophen **OR** acetaminophen, calcium carbonate (dosed in mg elemental calcium), camphor-menthol **AND** hydrOXYzine, docusate sodium, feeding supplement (NEPRO CARB STEADY), guaiFENesin-dextromethorphan, HYDROcodone-homatropine, ipratropium-albuterol, ondansetron **OR** ondansetron (ZOFRAN) IV, sorbitol, zolpidem  Exam: Gen no distress Sclera anicteric, throat clear  No jvd or bruits Chest no chg diffuse mild coarse exp wheezing RRR no MRG Abd soft ntnd no mass or ascites +bs obese Ext no sig LE edema / no wounds or ulcers Neuro is alert, Ox 3 , nf LUA AVF+bruit   CXR 4/20 - vasc congestion, no edema, poss IS edema pattern resolved  Dialysis: [facility]  131.5kg   2/2.25 bath  P4  Hep 4000  LUA AVF - Mircera 50 every 4 wks, last 3/23 - no Fe, ESA - last  Hb 10.7, Ca 9.6, P 5.7, pth 292    Assessment: 1. SOB/ cough/ fevers/ wheezing - +metapneumovirus by nasal swab.  2. Hypoxemia/ abnl CXR - resolved IS pattern on f/u CXR 4/20.  3. Anemia =- not on ESA 4. MBD of CKD cont meds 5. Volume - is under dry wt 3-4kg, no vol ^ on exam, stable  6. Dispo - per primary svc, stable from renal standpoint  Plan - HD on Monday if pt still here  Kelly Splinter MD [place] pager [phone]   05/09/2016, 1:05 PM    Recent Labs Lab 05/06/16 1148 05/07/16 0415 05/08/16 0509 05/09/16 0755  NA 138 136 132* 133*  K 5.0 4.1 4.6 4.2  CL 97* 95* 94* 94*  CO2 27 29 25 24   GLUCOSE 112* 105* 98 187*  BUN 38* 20 35* 28*  CREATININE 10.65* 6.91* 10.01* 8.26*  CALCIUM 8.9 8.6* 8.0* 8.5*  PHOS 6.8*  --  5.8* 5.2*    Recent Labs Lab 05/06/16 1148 05/07/16 0415 05/08/16 0509 05/09/16 0755  AST 34 39  --   --   ALT 34 32  --   --   ALKPHOS 80 80  --   --   BILITOT 0.8 0.8  --   --   PROT 7.4 7.0  --   --  ALBUMIN 3.6 3.3* 2.9* 3.1*    Recent Labs Lab 05/06/16 1148 05/07/16 0415 05/08/16 0509 05/09/16 0755  WBC 6.8 6.6 6.6 5.3  NEUTROABS 4.2  --  3.0 3.6  HGB 10.2* 10.4* 9.9* 10.2*  HCT 32.2* 33.1* 31.7* 30.8*  MCV 96.4 95.7 95.5 93.3  PLT 248 222 237 268   Iron/TIBC/Ferritin/ %Sat    Component Value Date/Time   IRON 62 01/03/2015 1650   TIBC 290 01/03/2015 1650   FERRITIN 286 01/03/2015 1650   IRONPCTSAT 21 01/03/2015 1650

## 2016-05-09 NOTE — Progress Notes (Signed)
Discharge instructions and medications discussed with patient and family. Prescriptions given. Questions addressed. IV removed. IV site clean, dry and intact.  Patient escorted out via wheelchair.  Wynema Birch, RN

## 2016-05-09 NOTE — Discharge Summary (Signed)
Physician Discharge Summary  Brenda Davis STM:[id number] DOB: Jun 06, 1965 DOA: 05/06/2016  PCP: Brenda Calico, MD  Admit date: 05/06/2016 Discharge date: 05/09/2016  Time spent: 60 minutes  Recommendations for Outpatient Follow-up:  1. Follow-up in hemodialysis center on Monday, 05/11/2016. 2. Follow-up with Brenda Calico, MD in 2 weeks. On follow-up patient will need a basic metabolic profile done to follow-up on electrolytes and renal function. Patient will also need risk factor modification.   Discharge Diagnoses:  Principal Problem:   HCAP (healthcare-associated pneumonia) Active Problems:   Hyperlipidemia with target LDL less than 100   HYPERTENSION, BENIGN ESSENTIAL   CKD (chronic kidney disease) stage V requiring chronic dialysis (MWF)   Acute respiratory failure with hypoxia (HCC)   Anemia due to chronic kidney disease   Infection due to human metapneumovirus (hMPV)   Febrile illness   Dyspnea   Discharge Condition: Stable and improved  Diet recommendation: Heart healthy  Filed Weights   05/08/16 1155 05/08/16 1804 05/08/16 2121  Weight: 128.4 kg (283 lb 1.1 oz) 128.8 kg (284 lb) 126.6 kg (279 lb 3.2 oz)    History of present illness:  Per Dr.Merrell RHEGAN Davis is a 51 y.o. female with medical history significant for chronic kidney disease now on dialysis less than 1 year, hypertension, dyslipidemia, anemia of chronic kidney disease, obesity. Patient reported that beginning this past Friday she developed cough fevers and shortness of breath. Overnight she had a MAXIMUM TEMPERATURE of 10 35F that went down to 10 68F after Tylenol. He reported she was too weak to attend dialysis. She also reported that sometimes after dialysis she is very very weak becomes short of breath postdialysis but only on postdialysis days. She is still trying to work while going to dialysis but sometimes has to leave work after 2 hours due to persistent weakness. Her last  dialysis treatment was on Monday. She missed on day of admission as described above.  ED Course:  Vital Signs: BP (!) 196/102   Pulse 94   Temp 98 F (36.7 C) (Oral)   Resp (!) 23   SpO2 100%  2 view CXR: Cardiomegaly with bilateral mild interstitial prominence which may reflect pulmonary vascular congestion versus interstitial infection Lab Data: Sodium 138, potassium 5.0, chloride 97, CO2 27, glucose 112, BUN 38, creatinine 10.65, LFTs normal, lactic acid 1.57 and 0.80, white count 6800 with normal differential, hemoglobin 10.2, platelets 248,000, coags normal respiratory viral panel obtained in ER, blood cultures obtained in ER, urinalysis mildly abnormal but not consistent with UTI Medications and treatments: None  Hospital Course:  #1 acute respiratory failure likely secondary to probable healthcare associated pneumonia/viral pneumonitis with Metapneumovirus and probable volume overload Patient had presented with cough, hypoxia, fever for several days chest x-ray concerning for pneumonitis or possible viral etiology. Patient placed empirically on broad-spectrum antibiotics. Patient noted during hemodialysis to be wheezing, worsening shortness of breath with diffuse rhonchi during the hospitalization. Respiratory viral panel positive for metapneumovirus. Patient remained afebrile. Patient with a normal white count. Sputum Gram stain and cultures with moderate gram-positive cocci in pairs, few gram-positive cocci in clusters, few gram-negative rods, gram-negative cocoa bacilli consistent with normal respiratory flora. Blood cultures obtained with no growth to date. Patient was initially placed on IV vancomycin and IV cefepime. IV vancomycin subsequently discontinued. Due to patient's wheezing and diffuse rhonchi patient was placed on IV Solu-Medrol 60 mg every 6 hours, Flonase, Claritin, PPI, scheduled DuoNeb nebs, Pulmicort, Mucinex. Patient improved clinically IV vancomycin was discontinued.  Patient's O2 sats improved such that by day of discharge patient was satting 98-99% on room air. Patient be discharged home on oral Augmentin 5 more days to complete a course of antibiotic treatment, steroid taper, Mucinex, Claritin, PPI, albuterol inhaler. Patient is to follow-up with PCP in the outpatient setting.   #2 end-stage renal disease on hemodialysis Monday Wednesday Friday Patient has been seen by nephrology. Patient underwent hemodialysis during the hospitalization and will follow-up at the hemodialysis center on Monday, 05/11/2016.   #3 hypertension Blood pressure improved since admission. Patient was placed back on home regimen of  Norvasc, Avapro,bystolic.  #4 hyperlipidemia Fasting lipid panel with a total cholesterol of 201, LDL of 125. Lifestyle modification. Outpatient follow-up.   #5 anemia of chronic disease Hemoglobin remained stable throughout the hospitalization.    Procedures:  Chest x-ray 05/06/2016, 05/08/2016  Consultations:  Nephrology: Dr.Schertz 05/06/2016  Discharge Exam: Vitals:   05/09/16 0915 05/09/16 1632  BP: (!) 161/79 (!) 145/71  Pulse: 66 69  Resp: 20 18  Temp: 98.6 F (37 C) 99 F (37.2 C)    General: NAD Cardiovascular: RRR Respiratory: Less coarse BS. Minimal wheezing  Discharge Instructions   Discharge Instructions    Diet - low sodium heart healthy    Complete by:  As directed    Increase activity slowly    Complete by:  As directed      Current Discharge Medication List    START taking these medications   Details  albuterol (PROVENTIL HFA;VENTOLIN HFA) 108 (90 Base) MCG/ACT inhaler Inhale 2 puffs into the lungs every 6 (six) hours as needed for wheezing or shortness of breath. Use 2 puffs 3 times daily x 5 days then every 6 hours as needed. Qty: 1 Inhaler, Refills: 1    amoxicillin-clavulanate (AUGMENTIN) 500-125 MG tablet Take 1 tablet (500 mg total) by mouth daily. Take for 5 days then stop. Qty: 5 tablet,  Refills: 0    fluticasone (FLONASE) 50 MCG/ACT nasal spray Place 2 sprays into both nostrils daily. Refills: 0    guaiFENesin (MUCINEX) 600 MG 12 hr tablet Take 2 tablets (1,200 mg total) by mouth 2 (two) times daily. Take for 5 days then stop. Qty: 20 tablet, Refills: 0    guaiFENesin-dextromethorphan (ROBITUSSIN DM) 100-10 MG/5ML syrup Take 5 mLs by mouth every 4 (four) hours as needed for cough. Qty: 118 mL, Refills: 0    loratadine (CLARITIN) 10 MG tablet Take 1 tablet (10 mg total) by mouth daily.    pantoprazole (PROTONIX) 40 MG tablet Take 1 tablet (40 mg total) by mouth daily. Qty: 30 tablet, Refills: 1    polyethylene glycol (MIRALAX / GLYCOLAX) packet Take 17 g by mouth daily as needed. Qty: 14 each, Refills: 0    predniSONE (DELTASONE) 20 MG tablet Take 1-3 tablets (20-60 mg total) by mouth daily with breakfast. Take 3 tablets (64m) daily x 3 days, then 2 tablets (473m daily x 3 days, then 1 tablet (2040mdaily x 3 days then stop. Qty: 18 tablet, Refills: 0      CONTINUE these medications which have CHANGED   Details  nebivolol (BYSTOLIC) 5 MG tablet Take 2 tablets (10 mg total) by mouth 2 (two) times daily.      CONTINUE these medications which have NOT CHANGED   Details  amLODipine (NORVASC) 10 MG tablet Take 1 tablet (10 mg total) by mouth daily. Qty: 30 tablet, Refills: 0    calcium acetate (PHOSLO) 667 MG capsule Take 2 capsules (  1,334 mg total) by mouth 3 (three) times daily with meals. Qty: 180 capsule, Refills: 0    irbesartan (AVAPRO) 300 MG tablet Take 300 mg by mouth at bedtime. Refills: 3    multivitamin (RENA-VIT) TABS tablet Take 1 tablet by mouth at bedtime. Qty: 30 tablet, Refills: 0      STOP taking these medications     colchicine 0.6 MG tablet      Cyanocobalamin 500 MCG/0.1ML SOLN      docusate sodium (COLACE) 100 MG capsule      oxycodone (OXY-IR) 5 MG capsule        Allergies  Allergen Reactions  . Clonidine Derivatives  Swelling  . Welchol [Colesevelam Hcl] Nausea Only   Follow-up Information    HD Follow up on 05/11/2016.   Why:  F/U at HD center on Monday 05/11/2016       Brenda Calico, MD. Schedule an appointment as soon as possible for a visit in 2 week(s).   Specialty:  Internal Medicine Contact information: [address] [address] [phone]            The results of significant diagnostics from this hospitalization (including imaging, microbiology, ancillary and laboratory) are listed below for reference.    Significant Diagnostic Studies: Dg Chest 2 View  Result Date: 05/08/2016 CLINICAL DATA:  Shortness of breath with cough EXAM: CHEST  2 VIEW COMPARISON:  Chest radiograph 4188 FINDINGS: There is unchanged cardiomegaly with contours suggesting and enlarged pulmonary artery. There is pulmonary vascular congestion without overt edema. No focal consolidation. No pneumothorax or pleural effusion. IMPRESSION: Cardiomegaly and central vascular congestion without overt pulmonary edema. Slightly improved aeration from the prior radiograph. Electronically Signed   By: Ulyses Jarred M.D.   On: 05/08/2016 14:59   Dg Chest 2 View  Result Date: 05/06/2016 CLINICAL DATA:  Fevers, chills, body aches, chest congestion EXAM: CHEST  2 VIEW COMPARISON:  05/04/2016, 12/05/2015, 01/23/2015 FINDINGS: There is mild bilateral interstitial prominence. There is no focal consolidation. There is no pleural effusion or pneumothorax. The heart and mediastinal contours are unremarkable. The osseous structures are unremarkable. IMPRESSION: Cardiomegaly with bilateral mild interstitial prominence which may reflect pulmonary vascular congestion versus interstitial infection. Electronically Signed   By: Kathreen Devoid   On: 05/06/2016 11:43   Dg Chest 2 View  Result Date: 05/04/2016 CLINICAL DATA:  Cough, congestion and fever. EXAM: CHEST  2 VIEW COMPARISON:  PA and lateral chest 12/05/2015.  FINDINGS: Tiny amount of fluid is seen in the minor fissure. Lungs are clear. Heart size is upper normal. No pneumothorax. IMPRESSION: No acute disease. Tiny amount of pleural fluid in the minor fissure is noted. Electronically Signed   By: Inge Rise M.D.   On: 05/04/2016 13:41    Microbiology: Recent Results (from the past 240 hour(s))  Culture, blood (Routine x 2)     Status: None (Preliminary result)   Collection Time: 05/06/16 11:49 AM  Result Value Ref Range Status   Specimen Description BLOOD LEFT ANTECUBITAL  Final   Special Requests   Final    BOTTLES DRAWN AEROBIC AND ANAEROBIC Blood Culture adequate volume   Culture NO GROWTH 3 DAYS  Final   Report Status PENDING  Incomplete  Culture, blood (Routine x 2)     Status: None (Preliminary result)   Collection Time: 05/06/16 12:30 PM  Result Value Ref Range Status   Specimen Description BLOOD RIGHT ANTECUBITAL  Final   Special Requests  Final    BOTTLES DRAWN AEROBIC AND ANAEROBIC Blood Culture adequate volume   Culture NO GROWTH 3 DAYS  Final   Report Status PENDING  Incomplete  Respiratory Panel by PCR     Status: Abnormal   Collection Time: 05/06/16  2:33 PM  Result Value Ref Range Status   Adenovirus NOT DETECTED NOT DETECTED Final   Coronavirus 229E NOT DETECTED NOT DETECTED Final   Coronavirus HKU1 NOT DETECTED NOT DETECTED Final   Coronavirus NL63 NOT DETECTED NOT DETECTED Final   Coronavirus OC43 NOT DETECTED NOT DETECTED Final   Metapneumovirus DETECTED (A) NOT DETECTED Final   Rhinovirus / Enterovirus NOT DETECTED NOT DETECTED Final   Influenza A NOT DETECTED NOT DETECTED Final   Influenza B NOT DETECTED NOT DETECTED Final   Parainfluenza Virus 1 NOT DETECTED NOT DETECTED Final   Parainfluenza Virus 2 NOT DETECTED NOT DETECTED Final   Parainfluenza Virus 3 NOT DETECTED NOT DETECTED Final   Parainfluenza Virus 4 NOT DETECTED NOT DETECTED Final   Respiratory Syncytial Virus NOT DETECTED NOT DETECTED Final    Bordetella pertussis NOT DETECTED NOT DETECTED Final   Chlamydophila pneumoniae NOT DETECTED NOT DETECTED Final   Mycoplasma pneumoniae NOT DETECTED NOT DETECTED Final  Culture, sputum-assessment     Status: None   Collection Time: 05/07/16  5:09 AM  Result Value Ref Range Status   Specimen Description EXPECTORATED SPUTUM  Final   Special Requests NONE  Final   Sputum evaluation THIS SPECIMEN IS ACCEPTABLE FOR SPUTUM CULTURE  Final   Report Status 05/07/2016 FINAL  Final  MRSA PCR Screening     Status: None   Collection Time: 05/07/16  5:09 AM  Result Value Ref Range Status   MRSA by PCR NEGATIVE NEGATIVE Final    Comment:        The GeneXpert MRSA Assay (FDA approved for NASAL specimens only), is one component of a comprehensive MRSA colonization surveillance program. It is not intended to diagnose MRSA infection nor to guide or monitor treatment for MRSA infections.   Culture, respiratory (NON-Expectorated)     Status: None   Collection Time: 05/07/16  5:09 AM  Result Value Ref Range Status   Specimen Description EXPECTORATED SPUTUM  Final   Special Requests NONE Reflexed from [id number]  Final   Gram Stain   Final    FEW WBC PRESENT, PREDOMINANTLY MONONUCLEAR MODERATE GRAM POSITIVE COCCI IN PAIRS FEW GRAM POSITIVE COCCI IN CLUSTERS FEW GRAM NEGATIVE RODS FEW GRAM NEGATIVE COCCOBACILLI    Culture Consistent with normal respiratory flora.  Final   Report Status 05/09/2016 FINAL  Final     Labs: Basic Metabolic Panel:  Recent Labs Lab 05/06/16 1148 05/07/16 0415 05/08/16 0509 05/09/16 0755  NA 138 136 132* 133*  K 5.0 4.1 4.6 4.2  CL 97* 95* 94* 94*  CO2 27 29 25 24   GLUCOSE 112* 105* 98 187*  BUN 38* 20 35* 28*  CREATININE 10.65* 6.91* 10.01* 8.26*  CALCIUM 8.9 8.6* 8.0* 8.5*  MG 2.3  --   --   --   PHOS 6.8*  --  5.8* 5.2*   Liver Function Tests:  Recent Labs Lab 05/06/16 1148 05/07/16 0415 05/08/16 0509 05/09/16 0755  AST 34 39  --   --   ALT 34 32   --   --   ALKPHOS 80 80  --   --   BILITOT 0.8 0.8  --   --   PROT 7.4 7.0  --   --  ALBUMIN 3.6 3.3* 2.9* 3.1*   No results for input(s): LIPASE, AMYLASE in the last 168 hours. No results for input(s): AMMONIA in the last 168 hours. CBC:  Recent Labs Lab 05/06/16 1148 05/07/16 0415 05/08/16 0509 05/09/16 0755  WBC 6.8 6.6 6.6 5.3  NEUTROABS 4.2  --  3.0 3.6  HGB 10.2* 10.4* 9.9* 10.2*  HCT 32.2* 33.1* 31.7* 30.8*  MCV 96.4 95.7 95.5 93.3  PLT 248 222 237 268   Cardiac Enzymes: No results for input(s): CKTOTAL, CKMB, CKMBINDEX, TROPONINI in the last 168 hours. BNP: BNP (last 3 results) No results for input(s): BNP in the last 8760 hours.  ProBNP (last 3 results) No results for input(s): PROBNP in the last 8760 hours.  CBG: No results for input(s): GLUCAP in the last 168 hours.     SignedIrine Seal MD.  [facility] 05/09/2016, 4:37 PM

## 2016-05-09 NOTE — Discharge Instructions (Signed)
Dialysis Diet Dialysis is a treatment that cleans your blood. It is used when the kidneys are damaged. When you need dialysis, you should watch your diet. This is because some nutrients can build up in your blood between treatments and make you sick. These nutrients are:  Potassium.  Phosphorus.  Sodium. Your doctor or dietitian will:  Tell you how much of these you can have.  Tell you if you need to look out for other nutrients too.  Help you plan meals.  Tell you how much to drink each day. What do I need to know about this diet?  Limit potassium. Potassium is in milk, fruits, and vegetables.  Limit phosphorus. Phosphorus is in milk, cheese, beans, nuts, and carbonated beverages.  Limit salt (sodium). Foods that have a lot sodium include processed and cured meats, ready-made frozen meals, canned vegetables, and salty snack foods.  Do not use salt substitutes.  Try not to eat whole-grain foods and foods that have a lot of fiber.  Follow your doctor's instructions about how much to drink. You may be told to:  Write down what you drink.  Write down foods you eat that are made mostly from water, such as gelatin and soups.  Drink from small cups.  Ask your doctor if you should take a medicine that binds phosphorus.  Take vitamin and mineral supplements only as told by your doctor.  Eat meat, poultry, fish, and eggs. Limit nuts and beans.  Before you cook potatoes, cut them into small pieces. Then boil them in unsalted water.  Drain all fluid from cooked vegetables and canned fruits before you eat them. What foods can I eat? Grains  White bread. White rice. Cooked cereal. Unsalted popcorn. Tortillas. Pasta. Vegetables  Fresh or frozen broccoli, carrots, and green beans. Cabbage. Cauliflower. Celery. Cucumbers. Eggplant. Radishes. Zucchini. Fruits  Apples. Fresh or frozen berries. Fresh or canned pears, peaches, and pineapple. Grapes. Plums. Meats and Other Protein  Sources  Fresh or frozen beef, pork, chicken, and fish. Eggs. Low-sodium canned tuna or salmon. Dairy  Cream cheese. Heavy cream. Ricotta cheese. Beverages  Apple cider. Cranberry juice. Grape juice. Lemonade. Black coffee. Condiments  Herbs. Spices. Jam and jelly. Honey. Sweets and Desserts  Sherbet. Cakes. Cookies. Fats and Oils  Olive oil, canola oil, and safflower oil. Other  Non-dairy creamer. Non-dairy whipped topping. Homemade broth without salt. The items listed above may not be a complete list of recommended foods or beverages. Contact your dietitian for more options.  What foods are not recommended? Grains  Whole-grain bread. Whole-grain pasta. High-fiber cereal. Vegetables  Potatoes. Beets. Tomatoes. Winter squash and pumpkin. Asparagus. Spinach. Parsnips. Fruits  Star fruit. Bananas. Oranges. Kiwi. Nectarines. Prunes. Melon. Dried fruit. Avocado. Meats and Other Protein Sources  Canned, smoked, and cured meats. Soil scientist. Sardines. Nuts and seeds. Peanut butter. Beans and legumes. Dairy  Milk. Buttermilk. Yogurt. Cheese and cottage cheese. Processed cheese spreads. Beverages  Orange juice. Prune juice. Carbonated soft drinks. Condiments  Salt. Salt substitutes. Soy sauce. Sweets and Desserts  Ice cream. Chocolate. Candied nuts. Fats and Oils  Butter. Margarine. Other  Ready-made frozen meals. Canned soups. The items listed above may not be a complete list of foods and beverages to avoid. Contact your dietitian for more information.  This information is not intended to replace advice given to you by your health care provider. Make sure you discuss any questions you have with your health care provider. Document Released: 07/07/2011 Document Revised: 06/13/2015  Document Reviewed: 08/08/2013 Elsevier Interactive Patient Education  2017 [employer]. Allergies An allergy is when your body reacts to a substance in a way that is not normal. An allergic  reaction can happen after you:  Eat something.  Breathe in something.  Touch something. You can be allergic to:  Things that are only around during certain seasons, like molds and pollens.  Foods.  Drugs.  Insects.  Animal dander. What are the signs or symptoms?  Puffiness (swelling). This may happen on the lips, face, tongue, mouth, or throat.  Sneezing.  Coughing.  Breathing loudly (wheezing).  Stuffy nose.  Tingling in the mouth.  A rash.  Itching.  Itchy, red, puffy areas of skin (hives).  Watery eyes.  Throwing up (vomiting).  Watery poop (diarrhea).  Dizziness.  Feeling faint or fainting.  Trouble breathing or swallowing.  A tight feeling in the chest.  A fast heartbeat. How is this diagnosed? Allergies can be diagnosed with:  A medical and family history.  Skin tests.  Blood tests.  A food diary. A food diary is a record of all the foods, drinks, and symptoms you have each day.  The results of an elimination diet. This diet involves making sure not to eat certain foods and then seeing what happens when you start eating them again. How is this treated? There is no cure for allergies, but allergic reactions can be treated with medicine. Severe reactions usually need to be treated at a hospital. How is this prevented? The best way to prevent an allergic reaction is to avoid the thing you are allergic to. Allergy shots and medicines can also help prevent reactions in some cases. This information is not intended to replace advice given to you by your health care provider. Make sure you discuss any questions you have with your health care provider. Document Released: 05/02/2012 Document Revised: 09/02/2015 Document Reviewed: 10/17/2013 Elsevier Interactive Patient Education  2017 [employer].

## 2016-05-09 NOTE — Progress Notes (Signed)
Pt given flutter at this time and instructed pt on use and frequency of use.

## 2016-05-11 LAB — CULTURE, BLOOD (ROUTINE X 2)
Culture: NO GROWTH
Culture: NO GROWTH
Special Requests: ADEQUATE
Special Requests: ADEQUATE

## 2016-05-12 ENCOUNTER — Other Ambulatory Visit (INDEPENDENT_AMBULATORY_CARE_PROVIDER_SITE_OTHER): Payer: 59

## 2016-05-12 ENCOUNTER — Ambulatory Visit (INDEPENDENT_AMBULATORY_CARE_PROVIDER_SITE_OTHER)
Admission: RE | Admit: 2016-05-12 | Discharge: 2016-05-12 | Disposition: A | Discharge status: Home / Self Care | Payer: 59 | Source: Ambulatory Visit | Attending: Internal Medicine | Admitting: Internal Medicine

## 2016-05-12 ENCOUNTER — Encounter: Payer: Self-pay | Admitting: Internal Medicine

## 2016-05-12 ENCOUNTER — Ambulatory Visit (INDEPENDENT_AMBULATORY_CARE_PROVIDER_SITE_OTHER): Payer: 59 | Admitting: Internal Medicine

## 2016-05-12 VITALS — BP 144/98 | HR 77 | Temp 97.8°F | Resp 18 | Ht 65.0 in | Wt 285.0 lb

## 2016-05-12 DIAGNOSIS — R05 Cough: Secondary | ICD-10-CM

## 2016-05-12 DIAGNOSIS — E519 Thiamine deficiency, unspecified: Secondary | ICD-10-CM | POA: Diagnosis not present

## 2016-05-12 DIAGNOSIS — R059 Cough, unspecified: Secondary | ICD-10-CM

## 2016-05-12 DIAGNOSIS — R0602 Shortness of breath: Secondary | ICD-10-CM | POA: Diagnosis not present

## 2016-05-12 DIAGNOSIS — J4551 Severe persistent asthma with (acute) exacerbation: Secondary | ICD-10-CM

## 2016-05-12 DIAGNOSIS — D518 Other vitamin B12 deficiency anemias: Secondary | ICD-10-CM

## 2016-05-12 LAB — CBC WITH DIFFERENTIAL/PLATELET
Basophils Absolute: 0.1 10*3/uL (ref 0.0–0.1)
Basophils Relative: 0.6 % (ref 0.0–3.0)
Eosinophils Absolute: 0 10*3/uL (ref 0.0–0.7)
Eosinophils Relative: 0.1 % (ref 0.0–5.0)
HCT: 33.1 % — ABNORMAL LOW (ref 36.0–46.0)
Hemoglobin: 10.9 g/dL — ABNORMAL LOW (ref 12.0–15.0)
Lymphocytes Relative: 9.5 % — ABNORMAL LOW (ref 12.0–46.0)
Lymphs Abs: 1.2 10*3/uL (ref 0.7–4.0)
MCHC: 32.9 g/dL (ref 30.0–36.0)
MCV: 93.3 fl (ref 78.0–100.0)
Monocytes Absolute: 0.6 10*3/uL (ref 0.1–1.0)
Monocytes Relative: 5 % (ref 3.0–12.0)
Neutro Abs: 10.5 10*3/uL — ABNORMAL HIGH (ref 1.4–7.7)
Neutrophils Relative %: 84.8 % — ABNORMAL HIGH (ref 43.0–77.0)
Platelets: 495 10*3/uL — ABNORMAL HIGH (ref 150.0–400.0)
RBC: 3.55 Mil/uL — ABNORMAL LOW (ref 3.87–5.11)
RDW: 15.2 % (ref 11.5–15.5)
WBC: 12.4 10*3/uL — ABNORMAL HIGH (ref 4.0–10.5)

## 2016-05-12 LAB — POCT EXHALED NITRIC OXIDE: FeNO level (ppb): 116

## 2016-05-12 MED ORDER — BUDESONIDE-FORMOTEROL FUMARATE 160-4.5 MCG/ACT IN AERO: 2.0000 | INHALATION_SPRAY | Freq: Two times a day (BID) | RESPIRATORY_TRACT | 12 refills | Status: DC

## 2016-05-12 NOTE — Patient Instructions (Signed)
Cough, Adult Coughing is a reflex that clears your throat and your airways. Coughing helps to heal and protect your lungs. It is normal to cough occasionally, but a cough that happens with other symptoms or lasts a long time may be a sign of a condition that needs treatment. A cough may last only 2-3 weeks (acute), or it may last longer than 8 weeks (chronic). What are the causes? Coughing is commonly caused by:  Breathing in substances that irritate your lungs.  A viral or bacterial respiratory infection.  Allergies.  Asthma.  Postnasal drip.  Smoking.  Acid backing up from the stomach into the esophagus (gastroesophageal reflux).  Certain medicines.  Chronic lung problems, including COPD (or rarely, lung cancer).  Other medical conditions such as heart failure.  Follow these instructions at home: Pay attention to any changes in your symptoms. Take these actions to help with your discomfort:  Take medicines only as told by your health care provider. ? If you were prescribed an antibiotic medicine, take it as told by your health care provider. Do not stop taking the antibiotic even if you start to feel better. ? Talk with your health care provider before you take a cough suppressant medicine.  Drink enough fluid to keep your urine clear or pale yellow.  If the air is dry, use a cold steam vaporizer or humidifier in your bedroom or your home to help loosen secretions.  Avoid anything that causes you to cough at work or at home.  If your cough is worse at night, try sleeping in a semi-upright position.  Avoid cigarette smoke. If you smoke, quit smoking. If you need help quitting, ask your health care provider.  Avoid caffeine.  Avoid alcohol.  Rest as needed.  Contact a health care provider if:  You have new symptoms.  You cough up pus.  Your cough does not get better after 2-3 weeks, or your cough gets worse.  You cannot control your cough with suppressant  medicines and you are losing sleep.  You develop pain that is getting worse or pain that is not controlled with pain medicines.  You have a fever.  You have unexplained weight loss.  You have night sweats. Get help right away if:  You cough up blood.  You have difficulty breathing.  Your heartbeat is very fast. This information is not intended to replace advice given to you by your health care provider. Make sure you discuss any questions you have with your health care provider. Document Released: 07/04/2010 Document Revised: 06/13/2015 Document Reviewed: 03/14/2014 Elsevier Interactive Patient Education  2017 [employer]  

## 2016-05-12 NOTE — Progress Notes (Signed)
Subjective:  Patient ID: Brenda Davis, female    DOB: 1965-06-04  Age: 51 y.o. MRN: [id number]  CC: Cough   HPI Brenda Davis presents for recurrent cough with SOB and wheezing, the cough keeps her awake at night. She was seen in the ED and CXR was suspicious for interstitial changes. She was prescribed augmentin and steroids.  Outpatient Medications Prior to Visit  Medication Sig Dispense Refill  . albuterol (PROVENTIL HFA;VENTOLIN HFA) 108 (90 Base) MCG/ACT inhaler Inhale 2 puffs into the lungs every 6 (six) hours as needed for wheezing or shortness of breath. Use 2 puffs 3 times daily x 5 days then every 6 hours as needed. 1 Inhaler 1  . amLODipine (NORVASC) 10 MG tablet Take 1 tablet (10 mg total) by mouth daily. 30 tablet 0  . calcium acetate (PHOSLO) 667 MG capsule Take 2 capsules (1,334 mg total) by mouth 3 (three) times daily with meals. 180 capsule 0  . fluticasone (FLONASE) 50 MCG/ACT nasal spray Place 2 sprays into both nostrils daily.  0  . guaiFENesin-dextromethorphan (ROBITUSSIN DM) 100-10 MG/5ML syrup Take 5 mLs by mouth every 4 (four) hours as needed for cough. 118 mL 0  . irbesartan (AVAPRO) 300 MG tablet Take 300 mg by mouth at bedtime.  3  . loratadine (CLARITIN) 10 MG tablet Take 1 tablet (10 mg total) by mouth daily.    . multivitamin (RENA-VIT) TABS tablet Take 1 tablet by mouth at bedtime. 30 tablet 0  . nebivolol (BYSTOLIC) 5 MG tablet Take 2 tablets (10 mg total) by mouth 2 (two) times daily.    . pantoprazole (PROTONIX) 40 MG tablet Take 1 tablet (40 mg total) by mouth daily. 30 tablet 1  . polyethylene glycol (MIRALAX / GLYCOLAX) packet Take 17 g by mouth daily as needed. 14 each 0  . amoxicillin-clavulanate (AUGMENTIN) 500-125 MG tablet Take 1 tablet (500 mg total) by mouth daily. Take for 5 days then stop. 5 tablet 0  . guaiFENesin (MUCINEX) 600 MG 12 hr tablet Take 2 tablets (1,200 mg total) by mouth 2 (two) times daily. Take for 5 days  then stop. 20 tablet 0  . predniSONE (DELTASONE) 20 MG tablet Take 1-3 tablets (20-60 mg total) by mouth daily with breakfast. Take 3 tablets (16m) daily x 3 days, then 2 tablets (469m daily x 3 days, then 1 tablet (2015mdaily x 3 days then stop. 18 tablet 0   No facility-administered medications prior to visit.     ROS Review of Systems  Constitutional: Negative for appetite change, chills, diaphoresis and fever.  HENT: Negative.  Negative for facial swelling and sore throat.   Eyes: Negative for visual disturbance.  Respiratory: Positive for cough, shortness of breath and wheezing. Negative for chest tightness and stridor.   Cardiovascular: Negative for chest pain, palpitations and leg swelling.  Gastrointestinal: Negative for abdominal pain, constipation, diarrhea, nausea and vomiting.  Endocrine: Negative.   Genitourinary: Negative.   Musculoskeletal: Negative.  Negative for back pain and neck pain.  Skin: Negative.  Negative for color change and rash.  Allergic/Immunologic: Negative.   Neurological: Negative.  Negative for dizziness and weakness.  Hematological: Negative for adenopathy. Does not bruise/bleed easily.  Psychiatric/Behavioral: Negative.     Objective:  BP (!) 144/98 (BP Location: Right Arm, Patient Position: Sitting, Cuff Size: Normal)   Pulse 77   Temp 97.8 F (36.6 C) (Oral)   Resp 18   Ht 5' 5"  (1.651 m)   Wt 285  lb (129.3 kg)   SpO2 98%   BMI 47.43 kg/m   BP Readings from Last 3 Encounters:  05/12/16 (!) 144/98  05/09/16 (!) 145/71  12/05/15 136/86    Wt Readings from Last 3 Encounters:  05/12/16 285 lb (129.3 kg)  05/08/16 279 lb 3.2 oz (126.6 kg)  12/05/15 286 lb 3 oz (129.8 kg)    Physical Exam  Constitutional: She is oriented to person, place, and time. She does not have a sickly appearance. She does not appear ill. No distress.  HENT:  Mouth/Throat: Oropharynx is clear and moist. No oropharyngeal exudate.  Eyes: Conjunctivae are  normal. Right eye exhibits no discharge. Left eye exhibits no discharge. No scleral icterus.  Neck: Normal range of motion. Neck supple. No JVD present. No tracheal deviation present. No thyromegaly present.  Cardiovascular: Normal rate, regular rhythm, normal heart sounds and intact distal pulses.  Exam reveals no gallop and no friction rub.   No murmur heard. Pulmonary/Chest: Effort normal. No accessory muscle usage or stridor. No tachypnea. No respiratory distress. She has no decreased breath sounds. She has wheezes in the right middle field, the right lower field, the left middle field and the left lower field. She has rhonchi in the right middle field, the right lower field, the left middle field and the left lower field. She has no rales. She exhibits no tenderness.  She has expiratory wheezes and rhonchi that clear with coughing but good air movement  Abdominal: Soft. Bowel sounds are normal. She exhibits no distension and no mass. There is no tenderness. There is no rebound and no guarding.  Musculoskeletal: Normal range of motion. She exhibits no edema, tenderness or deformity.  Lymphadenopathy:    She has no cervical adenopathy.  Neurological: She is oriented to person, place, and time.  Skin: Skin is warm and dry. No rash noted. She is not diaphoretic. No erythema. No pallor.  Vitals reviewed.   Lab Results  Component Value Date   WBC 12.4 (H) 05/12/2016   HGB 10.9 (L) 05/12/2016   HCT 33.1 (L) 05/12/2016   PLT 495.0 (H) 05/12/2016   GLUCOSE 187 (H) 05/09/2016   CHOL 201 (H) 12/05/2015   TRIG 86.0 12/05/2015   HDL 58.10 12/05/2015   LDLDIRECT 161.6 11/28/2012   LDLCALC 125 (H) 12/05/2015   ALT 32 05/07/2016   AST 39 05/07/2016   NA 133 (L) 05/09/2016   K 4.2 05/09/2016   CL 94 (L) 05/09/2016   CREATININE 8.26 (H) 05/09/2016   BUN 28 (H) 05/09/2016   CO2 24 05/09/2016   TSH 2.72 09/17/2015   INR 1.00 05/06/2016   HGBA1C 5.2 01/03/2015   MICROALBUR 159.00 (H) 09/17/2009     Dg Chest 2 View  Result Date: 05/06/2016 CLINICAL DATA:  Fevers, chills, body aches, chest congestion EXAM: CHEST  2 VIEW COMPARISON:  05/04/2016, 12/05/2015, 01/23/2015 FINDINGS: There is mild bilateral interstitial prominence. There is no focal consolidation. There is no pleural effusion or pneumothorax. The heart and mediastinal contours are unremarkable. The osseous structures are unremarkable. IMPRESSION: Cardiomegaly with bilateral mild interstitial prominence which may reflect pulmonary vascular congestion versus interstitial infection. Electronically Signed   By: Kathreen Devoid   On: 05/06/2016 11:43    Assessment & Plan:   Mckinzee was seen today for cough.  Diagnoses and all orders for this visit:  Cough- Her chest x-ray has cleared but today she has a very high FeNO score and abnormal lung sounds so will treat for asthma -  POCT EXHALED NITRIC OXIDE -     DG Chest 2 View; Future  Thiamine deficiency- I will monitor her CBC and her vitamin D level. -     CBC with Differential/Platelet; Future -     Vitamin B1; Future  Other vitamin B12 deficiency anemia- she remains anemic despite a normal B12 and folate level, this is most likely related to chronic renal disease. -     CBC with Differential/Platelet; Future -     Vitamin B12; Future -     Folate; Future  Severe persistent asthma with exacerbation- she has an elevated FeNO score so will treat aggressively with systemic and inhaled corticosteroids. I gave her sample of Symbicort and showed her how to use it. She demonstrated proficiency with its use. -     budesonide-formoterol (SYMBICORT) 160-4.5 MCG/ACT inhaler; Inhale 2 puffs into the lungs 2 (two) times daily.  Shortness of breath- our EKG machine was not functioning today so she did not undergo an EKG, her BNP is slightly elevated but it's not higher than it was previously so I don't think her symptoms are related to CHF or fluid overload, she will continue  hemodialysis. -     Brain natriuretic peptide; Future -     EKG 12-Lead   I have discontinued Ms. Aretha Parrot guaiFENesin, predniSONE, and amoxicillin-clavulanate. I am also having her start on budesonide-formoterol. Additionally, I am having her maintain her amLODipine, calcium acetate, multivitamin, irbesartan, fluticasone, guaiFENesin-dextromethorphan, loratadine, nebivolol, pantoprazole, polyethylene glycol, and albuterol.  Meds ordered this encounter  Medications  . budesonide-formoterol (SYMBICORT) 160-4.5 MCG/ACT inhaler    Sig: Inhale 2 puffs into the lungs 2 (two) times daily.    Dispense:  1 Inhaler    Refill:  12     Follow-up: Return in about 1 week (around 05/19/2016).  Scarlette Calico, MD

## 2016-05-12 NOTE — Progress Notes (Signed)
Pre visit review using our clinic review tool, if applicable. No additional management support is needed unless otherwise documented below in the visit note. 

## 2016-05-13 ENCOUNTER — Encounter: Payer: Self-pay | Admitting: Internal Medicine

## 2016-05-13 LAB — FOLATE: Folate: 13.3 ng/mL (ref >5.9)

## 2016-05-13 LAB — VITAMIN B12: Vitamin B-12: 1059 pg/mL — ABNORMAL HIGH (ref 211–911)

## 2016-05-13 LAB — BRAIN NATRIURETIC PEPTIDE: Pro B Natriuretic peptide (BNP): 423 pg/mL — ABNORMAL HIGH (ref 0.0–100.0)

## 2016-05-17 ENCOUNTER — Encounter: Payer: Self-pay | Admitting: Internal Medicine

## 2016-05-17 ENCOUNTER — Other Ambulatory Visit: Payer: Self-pay | Admitting: Internal Medicine

## 2016-05-17 DIAGNOSIS — E519 Thiamine deficiency, unspecified: Secondary | ICD-10-CM

## 2016-05-17 LAB — VITAMIN B1: Vitamin B1 (Thiamine): 6 nmol/L — ABNORMAL LOW (ref 8–30)

## 2016-05-17 MED ORDER — VITAMIN B-1 100 MG PO TABS
100.0000 mg | ORAL_TABLET | Freq: Every day | ORAL | 3 refills | Status: DC
Start: 2016-05-17 — End: 2017-06-21

## 2016-05-18 ENCOUNTER — Encounter: Payer: Self-pay | Admitting: Internal Medicine

## 2016-05-18 ENCOUNTER — Ambulatory Visit (INDEPENDENT_AMBULATORY_CARE_PROVIDER_SITE_OTHER): Payer: 59 | Admitting: Internal Medicine

## 2016-05-18 ENCOUNTER — Other Ambulatory Visit (INDEPENDENT_AMBULATORY_CARE_PROVIDER_SITE_OTHER): Payer: 59

## 2016-05-18 VITALS — BP 128/80 | HR 74 | Temp 98.0°F | Ht 65.0 in | Wt 290.2 lb

## 2016-05-18 DIAGNOSIS — I10 Essential (primary) hypertension: Secondary | ICD-10-CM | POA: Diagnosis not present

## 2016-05-18 DIAGNOSIS — R739 Hyperglycemia, unspecified: Secondary | ICD-10-CM | POA: Diagnosis not present

## 2016-05-18 DIAGNOSIS — E519 Thiamine deficiency, unspecified: Secondary | ICD-10-CM | POA: Diagnosis not present

## 2016-05-18 DIAGNOSIS — D518 Other vitamin B12 deficiency anemias: Secondary | ICD-10-CM

## 2016-05-18 LAB — CBC WITH DIFFERENTIAL/PLATELET
Basophils Absolute: 0.1 10*3/uL (ref 0.0–0.1)
Basophils Relative: 1.1 % (ref 0.0–3.0)
Eosinophils Absolute: 0.4 10*3/uL (ref 0.0–0.7)
Eosinophils Relative: 4.9 % (ref 0.0–5.0)
HCT: 30.5 % — ABNORMAL LOW (ref 36.0–46.0)
Hemoglobin: 9.9 g/dL — ABNORMAL LOW (ref 12.0–15.0)
Lymphocytes Relative: 29.5 % (ref 12.0–46.0)
Lymphs Abs: 2.6 10*3/uL (ref 0.7–4.0)
MCHC: 32.6 g/dL (ref 30.0–36.0)
MCV: 95.5 fl (ref 78.0–100.0)
Monocytes Absolute: 1.1 10*3/uL — ABNORMAL HIGH (ref 0.1–1.0)
Monocytes Relative: 12.5 % — ABNORMAL HIGH (ref 3.0–12.0)
Neutro Abs: 4.6 10*3/uL (ref 1.4–7.7)
Neutrophils Relative %: 52 % (ref 43.0–77.0)
Platelets: 358 10*3/uL (ref 150.0–400.0)
RBC: 3.19 Mil/uL — ABNORMAL LOW (ref 3.87–5.11)
RDW: 16 % — ABNORMAL HIGH (ref 11.5–15.5)
WBC: 8.9 10*3/uL (ref 4.0–10.5)

## 2016-05-18 LAB — BASIC METABOLIC PANEL
BUN: 59 mg/dL — ABNORMAL HIGH (ref 6–23)
CO2: 31 mEq/L (ref 19–32)
Calcium: 8.9 mg/dL (ref 8.4–10.5)
Chloride: 96 mEq/L (ref 96–112)
Creatinine, Ser: 11.61 mg/dL (ref 0.40–1.20)
GFR: 4.43 mL/min — CL (ref >60.00)
Glucose, Bld: 245 mg/dL — ABNORMAL HIGH (ref 70–99)
Potassium: 4.5 mEq/L (ref 3.5–5.1)
Sodium: 139 mEq/L (ref 135–145)

## 2016-05-18 LAB — HEMOGLOBIN A1C: Hgb A1c MFr Bld: 7 % — ABNORMAL HIGH (ref 4.6–6.5)

## 2016-05-18 MED ORDER — CYANOCOBALAMIN 1000 MCG/ML IJ SOLN
1000.0000 ug | Freq: Once | INTRAMUSCULAR | Status: AC
  Administered 2016-05-18: 1000 ug via INTRAMUSCULAR

## 2016-05-18 NOTE — Patient Instructions (Signed)
Anemia, Nonspecific Anemia is a condition in which the concentration of red blood cells or hemoglobin in the blood is below normal. Hemoglobin is a substance in red blood cells that carries oxygen to the tissues of the body. Anemia results in not enough oxygen reaching these tissues. What are the causes? Common causes of anemia include:  Excessive bleeding. Bleeding may be internal or external. This includes excessive bleeding from periods (in women) or from the intestine.  Poor nutrition.  Chronic kidney, thyroid, and liver disease.  Bone marrow disorders that decrease red blood cell production.  Cancer and treatments for cancer.  HIV, AIDS, and their treatments.  Spleen problems that increase red blood cell destruction.  Blood disorders.  Excess destruction of red blood cells due to infection, medicines, and autoimmune disorders. What are the signs or symptoms?  Minor weakness.  Dizziness.  Headache.  Palpitations.  Shortness of breath, especially with exercise.  Paleness.  Cold sensitivity.  Indigestion.  Nausea.  Difficulty sleeping.  Difficulty concentrating. Symptoms may occur suddenly or they may develop slowly. How is this diagnosed? Additional blood tests are often needed. These help your health care provider determine the best treatment. Your health care provider will check your stool for blood and look for other causes of blood loss. How is this treated? Treatment varies depending on the cause of the anemia. Treatment can include:  Supplements of iron, vitamin B12, or folic acid.  Hormone medicines.  A blood transfusion. This may be needed if blood loss is severe.  Hospitalization. This may be needed if there is significant continual blood loss.  Dietary changes.  Spleen removal. Follow these instructions at home: Keep all follow-up appointments. It often takes many weeks to correct anemia, and having your health care provider check on your  condition and your response to treatment is very important. Get help right away if:  You develop extreme weakness, shortness of breath, or chest pain.  You become dizzy or have trouble concentrating.  You develop heavy vaginal bleeding.  You develop a rash.  You have bloody or black, tarry stools.  You faint.  You vomit up blood.  You vomit repeatedly.  You have abdominal pain.  You have a fever or persistent symptoms for more than 2-3 days.  You have a fever and your symptoms suddenly get worse.  You are dehydrated. This information is not intended to replace advice given to you by your health care provider. Make sure you discuss any questions you have with your health care provider. Document Released: 02/13/2004 Document Revised: 06/19/2015 Document Reviewed: 07/01/2012 Elsevier Interactive Patient Education  2017 [employer]  

## 2016-05-18 NOTE — Progress Notes (Signed)
Subjective:  Patient ID: Brenda Davis, female    DOB: 04-Jun-1965  Age: 51 y.o. MRN: [id number]  CC: Anemia   HPI Brenda Davis presents for f/up - she has not started taking the Vit B1 supplement yet. Her cough has resolved and she has rare episodes of wheezing and fatigue. She denies shortness of breath, chest pain, hemoptysis, edema, paresthesias, or concerns about blood loss.  Outpatient Medications Prior to Visit  Medication Sig Dispense Refill  . albuterol (PROVENTIL HFA;VENTOLIN HFA) 108 (90 Base) MCG/ACT inhaler Inhale 2 puffs into the lungs every 6 (six) hours as needed for wheezing or shortness of breath. Use 2 puffs 3 times daily x 5 days then every 6 hours as needed. 1 Inhaler 1  . amLODipine (NORVASC) 10 MG tablet Take 1 tablet (10 mg total) by mouth daily. 30 tablet 0  . budesonide-formoterol (SYMBICORT) 160-4.5 MCG/ACT inhaler Inhale 2 puffs into the lungs 2 (two) times daily. 1 Inhaler 12  . calcium acetate (PHOSLO) 667 MG capsule Take 2 capsules (1,334 mg total) by mouth 3 (three) times daily with meals. 180 capsule 0  . fluticasone (FLONASE) 50 MCG/ACT nasal spray Place 2 sprays into both nostrils daily.  0  . irbesartan (AVAPRO) 300 MG tablet Take 300 mg by mouth at bedtime.  3  . loratadine (CLARITIN) 10 MG tablet Take 1 tablet (10 mg total) by mouth daily.    . multivitamin (RENA-VIT) TABS tablet Take 1 tablet by mouth at bedtime. 30 tablet 0  . nebivolol (BYSTOLIC) 5 MG tablet Take 2 tablets (10 mg total) by mouth 2 (two) times daily.    . pantoprazole (PROTONIX) 40 MG tablet Take 1 tablet (40 mg total) by mouth daily. 30 tablet 1  . polyethylene glycol (MIRALAX / GLYCOLAX) packet Take 17 g by mouth daily as needed. 14 each 0  . thiamine (VITAMIN B-1) 100 MG tablet Take 1 tablet (100 mg total) by mouth daily. 90 tablet 3  . guaiFENesin-dextromethorphan (ROBITUSSIN DM) 100-10 MG/5ML syrup Take 5 mLs by mouth every 4 (four) hours as needed for  cough. 118 mL 0   No facility-administered medications prior to visit.     ROS Review of Systems  Constitutional: Positive for fatigue. Negative for appetite change, chills, diaphoresis and unexpected weight change.  HENT: Negative.  Negative for trouble swallowing.   Eyes: Negative for visual disturbance.  Respiratory: Negative for cough, chest tightness, shortness of breath and wheezing.   Cardiovascular: Negative for chest pain, palpitations and leg swelling.  Gastrointestinal: Negative for abdominal pain, constipation, diarrhea, nausea and vomiting.  Genitourinary: Negative.  Negative for decreased urine volume, difficulty urinating, dysuria and hematuria.  Musculoskeletal: Negative for arthralgias, back pain, joint swelling and myalgias.  Skin: Negative for color change and rash.  Neurological: Negative.  Negative for dizziness, facial asymmetry, weakness and numbness.  Hematological: Negative for adenopathy. Does not bruise/bleed easily.  Psychiatric/Behavioral: Negative.     Objective:  BP 128/80 (BP Location: Right Arm, Patient Position: Sitting, Cuff Size: Large)   Pulse 74   Temp 98 F (36.7 C) (Oral)   Ht 5\' 5"  (1.651 m)   Wt 290 lb 4 oz (131.7 kg)   SpO2 100%   BMI 48.30 kg/m   BP Readings from Last 3 Encounters:  05/18/16 128/80  05/12/16 (!) 144/98  05/09/16 (!) 145/71    Wt Readings from Last 3 Encounters:  05/18/16 290 lb 4 oz (131.7 kg)  05/12/16 285 lb (129.3 kg)  05/08/16  279 lb 3.2 oz (126.6 kg)    Physical Exam  Constitutional: She is oriented to person, place, and time. No distress.  HENT:  Mouth/Throat: Oropharynx is clear and moist. No oropharyngeal exudate.  Eyes: Conjunctivae are normal. Right eye exhibits no discharge. Left eye exhibits no discharge. No scleral icterus.  Neck: Normal range of motion. Neck supple. No JVD present. No tracheal deviation present. No thyromegaly present.  Cardiovascular: Normal rate, regular rhythm and intact  distal pulses.  Exam reveals no gallop and no friction rub.   Murmur heard.  Systolic murmur is present with a grade of 1/6   No diastolic murmur is present  Pulmonary/Chest: Effort normal and breath sounds normal. No stridor. No respiratory distress. She has no wheezes. She has no rales. She exhibits no tenderness.  Abdominal: Soft. Bowel sounds are normal. She exhibits no distension and no mass. There is no tenderness. There is no rebound and no guarding.  Musculoskeletal: Normal range of motion. She exhibits no edema or tenderness.  Lymphadenopathy:    She has no cervical adenopathy.  Neurological: She is oriented to person, place, and time.  Skin: Skin is warm and dry. No rash noted. She is not diaphoretic. No erythema.  Vitals reviewed.   Lab Results  Component Value Date   WBC 8.9 05/18/2016   HGB 9.9 (L) 05/18/2016   HCT 30.5 (L) 05/18/2016   PLT 358.0 05/18/2016   GLUCOSE 245 (H) 05/18/2016   CHOL 201 (H) 12/05/2015   TRIG 86.0 12/05/2015   HDL 58.10 12/05/2015   LDLDIRECT 161.6 11/28/2012   LDLCALC 125 (H) 12/05/2015   ALT 32 05/07/2016   AST 39 05/07/2016   NA 139 05/18/2016   K 4.5 05/18/2016   CL 96 05/18/2016   CREATININE 11.61 (HH) 05/18/2016   BUN 59 (H) 05/18/2016   CO2 31 05/18/2016   TSH 2.72 09/17/2015   INR 1.00 05/06/2016   HGBA1C 7.0 (H) 05/18/2016   MICROALBUR 159.00 (H) 09/17/2009    Dg Chest 2 View  Result Date: 05/13/2016 CLINICAL DATA:  Recent hospitalization, cough, congestion, shortness of breath EXAM: CHEST  2 VIEW COMPARISON:  Chest x-ray of 05/08/2016 FINDINGS: No active infiltrate or effusion is seen. Mediastinal and hilar contours are unremarkable. The heart is borderline in size. No bony abnormality is seen. IMPRESSION: No active cardiopulmonary disease.  Heart upper normal. Electronically Signed   By: Ivar Drape M.D.   On: 05/13/2016 08:37    Assessment & Plan:   Brenda Davis was seen today for anemia.  Diagnoses and all orders for this  visit:  Other vitamin B12 deficiency anemia- her H/H are slightly lower today, she agrees to start the thiamine supplement, B12 injection given today -     cyanocobalamin ((VITAMIN B-12)) injection 1,000 mcg; Inject 1 mL (1,000 mcg total) into the muscle once. -     CBC with Differential/Platelet; Future  Hyperglycemia- her A1C is up to 7%, she has DM2, no meds are needed to treat this at this time -     Hemoglobin A1c; Future  HYPERTENSION, BENIGN ESSENTIAL- her BP is adequately well controled -     Basic metabolic panel; Future  Thiamine deficiency- as above   I have discontinued Ms. Aretha Parrot guaiFENesin-dextromethorphan. I am also having her maintain her amLODipine, calcium acetate, multivitamin, irbesartan, fluticasone, loratadine, nebivolol, pantoprazole, polyethylene glycol, albuterol, budesonide-formoterol, and thiamine. We administered cyanocobalamin.  Meds ordered this encounter  Medications  . cyanocobalamin ((VITAMIN B-12)) injection 1,000 mcg  Follow-up: Return in about 3 months (around 08/17/2016).  Scarlette Calico, MD

## 2016-05-18 NOTE — Progress Notes (Signed)
Pre visit review using our clinic review tool, if applicable. No additional management support is needed unless otherwise documented below in the visit note. 

## 2016-05-19 ENCOUNTER — Telehealth: Payer: Self-pay | Admitting: Internal Medicine

## 2016-05-19 NOTE — Telephone Encounter (Signed)
Patient requesting call back.  Looking for FMLA forms that were suppose to be completed.  States she dropped off up front but have not seen phone note or FYI.  Do either of you have this?

## 2016-05-19 NOTE — Telephone Encounter (Signed)
I don't recall any FMLA forms.

## 2016-05-19 NOTE — Telephone Encounter (Signed)
I have not seen any FMLA forms. Also do not see any notes?

## 2016-05-19 NOTE — Telephone Encounter (Signed)
Spoke with patient states she dropped off on Friday afternoon and that FMLA forms are due on 5/2.  Spoke with Stef and states we can request an extension once we get the forms.  Did leave this info on patients VM.   Patient did inform me earlier that she was going to get an additional copy to bring up to our office for completion.

## 2016-05-20 NOTE — Telephone Encounter (Signed)
Patient dropped off forms yesterday. He stated they are FMLA forms. I am unsure if they are. Given to Stef so she could look over forms.

## 2016-05-22 NOTE — Telephone Encounter (Signed)
Patient would like to know an update on form completions.  Patient phone number is [phone].

## 2016-05-25 NOTE — Telephone Encounter (Signed)
Called pt. Verified reason for disability. Pt stated HCAP.  Form completed and given to PCP for signature.

## 2016-05-26 NOTE — Telephone Encounter (Signed)
Form has been faxed.

## 2016-05-27 DIAGNOSIS — Z0279 Encounter for issue of other medical certificate: Secondary | ICD-10-CM

## 2016-06-18 ENCOUNTER — Ambulatory Visit (INDEPENDENT_AMBULATORY_CARE_PROVIDER_SITE_OTHER): Payer: 59 | Admitting: Nurse Practitioner

## 2016-06-18 ENCOUNTER — Encounter: Payer: Self-pay | Admitting: Nurse Practitioner

## 2016-06-18 VITALS — BP 142/90 | HR 57 | Temp 97.9°F | Ht 65.0 in | Wt 288.0 lb

## 2016-06-18 DIAGNOSIS — M6530 Trigger finger, unspecified finger: Secondary | ICD-10-CM | POA: Diagnosis not present

## 2016-06-18 DIAGNOSIS — IMO0002 Reserved for concepts with insufficient information to code with codable children: Secondary | ICD-10-CM

## 2016-06-18 DIAGNOSIS — J4541 Moderate persistent asthma with (acute) exacerbation: Secondary | ICD-10-CM | POA: Diagnosis not present

## 2016-06-18 DIAGNOSIS — J0140 Acute pansinusitis, unspecified: Secondary | ICD-10-CM

## 2016-06-18 MED ORDER — ALBUTEROL SULFATE HFA 108 (90 BASE) MCG/ACT IN AERS: 1.0000 | INHALATION_SPRAY | Freq: Four times a day (QID) | RESPIRATORY_TRACT | 1 refills | Status: DC | PRN

## 2016-06-18 MED ORDER — BENZONATATE 100 MG PO CAPS: 100.0000 mg | ORAL_CAPSULE | Freq: Three times a day (TID) | ORAL | 0 refills | Status: DC | PRN

## 2016-06-18 MED ORDER — GUAIFENESIN ER 600 MG PO TB12: 600.0000 mg | ORAL_TABLET | Freq: Two times a day (BID) | ORAL | 0 refills | Status: DC | PRN

## 2016-06-18 MED ORDER — DOXYCYCLINE HYCLATE 100 MG PO TABS: 100.0000 mg | ORAL_TABLET | Freq: Two times a day (BID) | ORAL | 0 refills | Status: DC

## 2016-06-18 MED ORDER — FLUTICASONE PROPIONATE 50 MCG/ACT NA SUSP: 2.0000 | Freq: Every day | NASAL | 0 refills | Status: DC

## 2016-06-18 NOTE — Progress Notes (Signed)
Subjective:  Patient ID: Brenda Davis, female    DOB: [birth year]  Age: 51 y.o. MRN: [id number]  CC: Cough (sore throat,mucus in both eyes,cough for 5 days/ legs swelling when walk--goes down when elavate for 3wks. )  Cough  The current episode started in the past 7 days. The problem has been gradually worsening. The problem occurs constantly. The cough is productive of sputum. Associated symptoms include ear congestion, ear pain, headaches, nasal congestion, postnasal drip, rhinorrhea and a sore throat. Pertinent negatives include no chest pain, shortness of breath or wheezing. She has tried OTC cough suppressant for the symptoms. The treatment provided no relief. Her past medical history is significant for asthma.  Hand Pain   The incident occurred more than 1 week ago. The injury mechanism was repetitive motion. The pain is present in the left fingers and right fingers. The quality of the pain is described as aching (stiffness). The pain is moderate. The pain has been intermittent since the incident. Pertinent negatives include no chest pain, muscle weakness, numbness or tingling. The symptoms are aggravated by movement (over use, typing). She has tried heat and rest (warm compress, steriod injection and tendon release) for the symptoms. The treatment provided mild relief.    Outpatient Medications Prior to Visit  Medication Sig Dispense Refill  . amLODipine (NORVASC) 10 MG tablet Take 1 tablet (10 mg total) by mouth daily. 30 tablet 0  . calcium acetate (PHOSLO) 667 MG capsule Take 2 capsules (1,334 mg total) by mouth 3 (three) times daily with meals. 180 capsule 0  . irbesartan (AVAPRO) 300 MG tablet Take 300 mg by mouth at bedtime.  3  . multivitamin (RENA-VIT) TABS tablet Take 1 tablet by mouth at bedtime. 30 tablet 0  . nebivolol (BYSTOLIC) 5 MG tablet Take 2 tablets (10 mg total) by mouth 2 (two) times daily.    . polyethylene glycol (MIRALAX / GLYCOLAX) packet Take 17 g by  mouth daily as needed. 14 each 0  . thiamine (VITAMIN B-1) 100 MG tablet Take 1 tablet (100 mg total) by mouth daily. 90 tablet 3  . albuterol (PROVENTIL HFA;VENTOLIN HFA) 108 (90 Base) MCG/ACT inhaler Inhale 2 puffs into the lungs every 6 (six) hours as needed for wheezing or shortness of breath. Use 2 puffs 3 times daily x 5 days then every 6 hours as needed. (Patient not taking: Reported on 06/18/2016) 1 Inhaler 1  . budesonide-formoterol (SYMBICORT) 160-4.5 MCG/ACT inhaler Inhale 2 puffs into the lungs 2 (two) times daily. (Patient not taking: Reported on 06/18/2016) 1 Inhaler 12  . fluticasone (FLONASE) 50 MCG/ACT nasal spray Place 2 sprays into both nostrils daily. (Patient not taking: Reported on 06/18/2016)  0  . loratadine (CLARITIN) 10 MG tablet Take 1 tablet (10 mg total) by mouth daily. (Patient not taking: Reported on 06/18/2016)    . pantoprazole (PROTONIX) 40 MG tablet Take 1 tablet (40 mg total) by mouth daily. (Patient not taking: Reported on 06/18/2016) 30 tablet 1   No facility-administered medications prior to visit.     ROS See HPI  Objective:  BP (!) 142/90   Pulse (!) 57   Temp 97.9 F (36.6 C)   Ht 5' 5"  (1.651 m)   Wt 288 lb (130.6 kg)   SpO2 100%   BMI 47.93 kg/m   BP Readings from Last 3 Encounters:  06/18/16 (!) 142/90  05/18/16 128/80  05/12/16 (!) 144/98    Wt Readings from Last 3 Encounters:  06/18/16 288  lb (130.6 kg)  05/18/16 290 lb 4 oz (131.7 kg)  05/12/16 285 lb (129.3 kg)    Physical Exam  Constitutional: She is oriented to person, place, and time.  HENT:  Right Ear: Tympanic membrane, external ear and ear canal normal.  Left Ear: Tympanic membrane, external ear and ear canal normal.  Nose: Mucosal edema and rhinorrhea present. Right sinus exhibits maxillary sinus tenderness. Right sinus exhibits no frontal sinus tenderness. Left sinus exhibits maxillary sinus tenderness. Left sinus exhibits no frontal sinus tenderness.  Mouth/Throat: Uvula  is midline. No trismus in the jaw. Posterior oropharyngeal erythema present. No oropharyngeal exudate.  Eyes: No scleral icterus.  Neck: Normal range of motion. Neck supple.  Cardiovascular: Normal rate and normal heart sounds.   Pulmonary/Chest: Effort normal and breath sounds normal.  Musculoskeletal:       Right wrist: Normal.       Left wrist: Normal.       Right hand: Normal.       Left hand: Normal.  Lymphadenopathy:    She has cervical adenopathy.  Neurological: She is alert and oriented to person, place, and time.  Vitals reviewed.   Lab Results  Component Value Date   WBC 8.9 05/18/2016   HGB 9.9 (L) 05/18/2016   HCT 30.5 (L) 05/18/2016   PLT 358.0 05/18/2016   GLUCOSE 245 (H) 05/18/2016   CHOL 201 (H) 12/05/2015   TRIG 86.0 12/05/2015   HDL 58.10 12/05/2015   LDLDIRECT 161.6 11/28/2012   LDLCALC 125 (H) 12/05/2015   ALT 32 05/07/2016   AST 39 05/07/2016   NA 139 05/18/2016   K 4.5 05/18/2016   CL 96 05/18/2016   CREATININE 11.61 (HH) 05/18/2016   BUN 59 (H) 05/18/2016   CO2 31 05/18/2016   TSH 2.72 09/17/2015   INR 1.00 05/06/2016   HGBA1C 7.0 (H) 05/18/2016   MICROALBUR 159.00 (H) 09/17/2009    Dg Chest 2 View  Result Date: 05/13/2016 CLINICAL DATA:  Recent hospitalization, cough, congestion, shortness of breath EXAM: CHEST  2 VIEW COMPARISON:  Chest x-ray of 05/08/2016 FINDINGS: No active infiltrate or effusion is seen. Mediastinal and hilar contours are unremarkable. The heart is borderline in size. No bony abnormality is seen. IMPRESSION: No active cardiopulmonary disease.  Heart upper normal. Electronically Signed   By: Ivar Drape M.D.   On: 05/13/2016 08:37    Assessment & Plan:   Brenda Davis was seen today for cough.  Diagnoses and all orders for this visit:  Acute non-recurrent pansinusitis -     albuterol (PROVENTIL HFA;VENTOLIN HFA) 108 (90 Base) MCG/ACT inhaler; Inhale 1-2 puffs into the lungs every 6 (six) hours as needed for wheezing or  shortness of breath. -     fluticasone (FLONASE) 50 MCG/ACT nasal spray; Place 2 sprays into both nostrils daily. -     guaiFENesin (MUCINEX) 600 MG 12 hr tablet; Take 1 tablet (600 mg total) by mouth 2 (two) times daily as needed for cough or to loosen phlegm. -     benzonatate (TESSALON) 100 MG capsule; Take 1 capsule (100 mg total) by mouth 3 (three) times daily as needed for cough. -     doxycycline (VIBRA-TABS) 100 MG tablet; Take 1 tablet (100 mg total) by mouth 2 (two) times daily.  Trigger finger of both hands -     Ambulatory referral to Sports Medicine  Moderate persistent asthma with exacerbation -     albuterol (PROVENTIL HFA;VENTOLIN HFA) 108 (90 Base) MCG/ACT inhaler; Inhale  1-2 puffs into the lungs every 6 (six) hours as needed for wheezing or shortness of breath. -     fluticasone (FLONASE) 50 MCG/ACT nasal spray; Place 2 sprays into both nostrils daily. -     guaiFENesin (MUCINEX) 600 MG 12 hr tablet; Take 1 tablet (600 mg total) by mouth 2 (two) times daily as needed for cough or to loosen phlegm. -     benzonatate (TESSALON) 100 MG capsule; Take 1 capsule (100 mg total) by mouth 3 (three) times daily as needed for cough. -     doxycycline (VIBRA-TABS) 100 MG tablet; Take 1 tablet (100 mg total) by mouth 2 (two) times daily.   I have discontinued Brenda Davis loratadine, pantoprazole, and budesonide-formoterol. I have also changed her albuterol. Additionally, I am having her start on guaiFENesin, benzonatate, and doxycycline. Lastly, I am having her maintain her amLODipine, calcium acetate, multivitamin, irbesartan, nebivolol, polyethylene glycol, thiamine, and fluticasone.  Meds ordered this encounter  Medications  . albuterol (PROVENTIL HFA;VENTOLIN HFA) 108 (90 Base) MCG/ACT inhaler    Sig: Inhale 1-2 puffs into the lungs every 6 (six) hours as needed for wheezing or shortness of breath.    Dispense:  1 Inhaler    Refill:  1    Order Specific Question:    Supervising Provider    Answer:   Cassandria Anger [[id number]]  . fluticasone (FLONASE) 50 MCG/ACT nasal spray    Sig: Place 2 sprays into both nostrils daily.    Dispense:  16 g    Refill:  0    Order Specific Question:   Supervising Provider    Answer:   Cassandria Anger [[id number]]  . guaiFENesin (MUCINEX) 600 MG 12 hr tablet    Sig: Take 1 tablet (600 mg total) by mouth 2 (two) times daily as needed for cough or to loosen phlegm.    Dispense:  14 tablet    Refill:  0    Order Specific Question:   Supervising Provider    Answer:   Cassandria Anger [[id number]]  . benzonatate (TESSALON) 100 MG capsule    Sig: Take 1 capsule (100 mg total) by mouth 3 (three) times daily as needed for cough.    Dispense:  20 capsule    Refill:  0    Order Specific Question:   Supervising Provider    Answer:   Cassandria Anger [[id number]]  . doxycycline (VIBRA-TABS) 100 MG tablet    Sig: Take 1 tablet (100 mg total) by mouth 2 (two) times daily.    Dispense:  14 tablet    Refill:  0    Order Specific Question:   Supervising Provider    Answer:   Cassandria Anger [[id number]]    Follow-up: Return if symptoms worsen or fail to improve.  Wilfred Lacy, NP

## 2016-06-18 NOTE — Patient Instructions (Addendum)
Maintain lows sodium diet. Take amlodipine at bedtime and irbesartan in morning.  URI Instructions: Encourage adequate oral hydration.  Use over-the-counter  "cold" medicines  such as "Tylenol cold" , "Advil cold",  "Mucinex" or" Mucinex DM"  for cough and congestion.   Avoid decongestants if you have high blood pressure.   Use" Delsym" or" Robitussin" cough syrup varietis for cough.  You can use plain "Tylenol" or "Advi"l for fever, chills and achyness.

## 2016-06-23 ENCOUNTER — Ambulatory Visit (INDEPENDENT_AMBULATORY_CARE_PROVIDER_SITE_OTHER): Payer: 59 | Admitting: Internal Medicine

## 2016-06-23 ENCOUNTER — Encounter: Payer: Self-pay | Admitting: Internal Medicine

## 2016-06-23 VITALS — BP 136/88 | HR 60 | Ht 65.0 in | Wt 284.0 lb

## 2016-06-23 DIAGNOSIS — J309 Allergic rhinitis, unspecified: Secondary | ICD-10-CM

## 2016-06-23 DIAGNOSIS — H6982 Other specified disorders of Eustachian tube, left ear: Secondary | ICD-10-CM | POA: Diagnosis not present

## 2016-06-23 DIAGNOSIS — J4541 Moderate persistent asthma with (acute) exacerbation: Secondary | ICD-10-CM | POA: Diagnosis not present

## 2016-06-23 DIAGNOSIS — R42 Dizziness and giddiness: Secondary | ICD-10-CM | POA: Diagnosis not present

## 2016-06-23 DIAGNOSIS — H9311 Tinnitus, right ear: Secondary | ICD-10-CM | POA: Diagnosis not present

## 2016-06-23 DIAGNOSIS — J019 Acute sinusitis, unspecified: Secondary | ICD-10-CM | POA: Diagnosis not present

## 2016-06-23 MED ORDER — MECLIZINE HCL 12.5 MG PO TABS: 12.5000 mg | ORAL_TABLET | Freq: Three times a day (TID) | ORAL | 1 refills | Status: DC | PRN

## 2016-06-23 MED ORDER — FLUTICASONE PROPIONATE 50 MCG/ACT NA SUSP: 2.0000 | Freq: Every day | NASAL | 1 refills | Status: DC

## 2016-06-23 MED ORDER — GUAIFENESIN ER 600 MG PO TB12: 600.0000 mg | ORAL_TABLET | Freq: Two times a day (BID) | ORAL | 1 refills | Status: DC | PRN

## 2016-06-23 MED ORDER — CETIRIZINE HCL 10 MG PO TABS: 10.0000 mg | ORAL_TABLET | Freq: Every day | ORAL | 11 refills | Status: DC

## 2016-06-23 NOTE — Progress Notes (Signed)
Subjective:    Patient ID: Brenda Davis, female    DOB: 1965/10/19, 51 y.o.   MRN: [id number]  HPI  Here to f/u with acute visit for left ear hearing reduction assoc with sudden onset left ear pain x 1 day, assoc with right ear tinnitus, but also dizziness, n/v x 1, left ear popping and crackling off and on, and some balance difficulty.  No high fever, chills, HA, ST, sinus congestion after recent pain and congestion 1-2 wks ago, and Pt denies chest pain, increased sob or doe, wheezing, orthopnea, PND, increased LE swelling, palpitations, dizziness or syncope.  Does have several wks ongoing nasal allergy symptoms with clearish congestion, itch and sneezing, without fever, pain, ST, cough, swelling or wheezing.   Past Medical History:  Diagnosis Date  . Anemia   . Chronic kidney disease    STAGE 3 CHRONIC KIDNEY DISEASE SECONDARY TO DIABETIC GLOMERULOSCLEROSIS AND UNCONTROLLED HYPERTENSION - PER OFFICE NOTES DR. [facility].  Marland Kitchen ESRD on dialysis [facility])    "MWF; [address] (05/06/2016)  . Gout   . HCAP (healthcare-associated pneumonia) 05/06/2016  . History of blood transfusion    "related to surgery"  . Hyperlipidemia   . Hypertension   . Morbid obesity ([place])   . Pain    LEFT SHOULDER PAIN - WAS SEEN AT AN URGENT CARE - GIVEN SLING FOR COMFORT AND TOLD ROM AS TOLERATED.  . Type II diabetes mellitus ([place])    "gastric sleeve OR corrected this" (05/06/2016)   Past Surgical History:  Procedure Laterality Date  . AV FISTULA PLACEMENT Left 01/03/2015   Procedure: BRACHIAL CEPHALIC ARTERIOVENOUS  FISTULA CREATION LEFT ARM;  Surgeon: Angelia Mould, MD;  Location: [facility];  Service: Vascular;  Laterality: Left;  . CARPAL TUNNEL RELEASE Right   . [place]  . ESOPHAGOGASTRODUODENOSCOPY N/A 09/04/2012   Procedure: ESOPHAGOGASTRODUODENOSCOPY (EGD);  Surgeon: Shann Medal, MD;  Location: [facility];  Service: General;  Laterality: N/A;   PF  . ESOPHAGOGASTRODUODENOSCOPY (EGD) WITH ESOPHAGEAL DILATION N/A 09/29/2012   Procedure: ESOPHAGOGASTRODUODENOSCOPY (EGD) WITH ESOPHAGEAL DILATION;  Surgeon: Milus Banister, MD;  Location: [facility];  Service: Endoscopy;  Laterality: N/A;  . INSERTION OF DIALYSIS CATHETER N/A 01/03/2015   Procedure: INSERTION OF DIALYSIS CATHETER RIGHT INTERNAL JUGULAR;  Surgeon: Angelia Mould, MD;  Location: [facility];  Service: Vascular;  Laterality: N/A;  . LAPAROSCOPIC GASTRIC SLEEVE RESECTION N/A 07/19/2012   Procedure: LAPAROSCOPIC SLEEVE GASTRECTOMY with EGD;  Surgeon: Madilyn Hook, DO;  Location: [facility];  Service: General;  Laterality: N/A;  laparoscopic sleeve gastrectomy with EGD  . PERIPHERAL VASCULAR CATHETERIZATION Left 05/23/2015   Procedure: Nolon Stalls;  Surgeon: Conrad Ravensworth, MD;  Location: [facility];  Service: Cardiovascular;  Laterality: Left;  upper aRM  . PERIPHERAL VASCULAR CATHETERIZATION Left 05/23/2015   Procedure: Peripheral Vascular Balloon Angioplasty;  Surgeon: Conrad Evadale, MD;  Location: [facility];  Service: Cardiovascular;  Laterality: Left;  av fistula  . TUBAL LIGATION  1993  . UPPER GI ENDOSCOPY N/A 07/19/2012   Procedure: UPPER GI ENDOSCOPY;  Surgeon: Madilyn Hook, DO;  Location: [facility];  Service: General;  Laterality: N/A;    reports that she quit smoking about 9 years ago. Her smoking use included Cigarettes. She has a 16.00 pack-year smoking history. She has never used smokeless tobacco. She reports that she drinks alcohol. She reports that she does not use drugs. family history includes Arthritis in  her other; Diabetes in her father; Hyperlipidemia in her other; Hypertension in her mother. Allergies  Allergen Reactions  . Clonidine Derivatives Swelling  . Welchol [Colesevelam Hcl] Nausea Only   Current Outpatient Prescriptions on File Prior to Visit  Medication Sig Dispense Refill  . albuterol (PROVENTIL HFA;VENTOLIN HFA) 108 (90 Base) MCG/ACT  inhaler Inhale 1-2 puffs into the lungs every 6 (six) hours as needed for wheezing or shortness of breath. 1 Inhaler 1  . amLODipine (NORVASC) 10 MG tablet Take 1 tablet (10 mg total) by mouth daily. 30 tablet 0  . benzonatate (TESSALON) 100 MG capsule Take 1 capsule (100 mg total) by mouth 3 (three) times daily as needed for cough. 20 capsule 0  . calcium acetate (PHOSLO) 667 MG capsule Take 2 capsules (1,334 mg total) by mouth 3 (three) times daily with meals. 180 capsule 0  . irbesartan (AVAPRO) 300 MG tablet Take 300 mg by mouth at bedtime.  3  . multivitamin (RENA-VIT) TABS tablet Take 1 tablet by mouth at bedtime. 30 tablet 0  . nebivolol (BYSTOLIC) 5 MG tablet Take 2 tablets (10 mg total) by mouth 2 (two) times daily.    . polyethylene glycol (MIRALAX / GLYCOLAX) packet Take 17 g by mouth daily as needed. 14 each 0  . thiamine (VITAMIN B-1) 100 MG tablet Take 1 tablet (100 mg total) by mouth daily. 90 tablet 3   No current facility-administered medications on file prior to visit.    Review of Systems  Constitutional: Negative for other unusual diaphoresis or sweats HENT: Negative for ear discharge or swelling Eyes: Negative for other worsening visual disturbances Respiratory: Negative for stridor or other swelling  Gastrointestinal: Negative for worsening distension or other blood Genitourinary: Negative for retention or other urinary change Musculoskeletal: Negative for other MSK pain or swelling Skin: Negative for color change or other new lesions Neurological: Negative for worsening tremors and other numbness  Psychiatric/Behavioral: Negative for worsening agitation or other fatigue All other system neg per pt    Objective:   Physical Exam BP 136/88   Pulse 60   Ht 5\' 5"  (1.651 m)   Wt 284 lb (128.8 kg)   SpO2 99%   BMI 47.26 kg/m  VS noted,  Constitutional: Pt appears in NAD HENT: Head: NCAT.  Right Ear: External ear normal.  Left Ear: External ear normal.  Bilat  tm's with mild erythema.  Max sinus areas non tender.  Pharynx with mild erythema, no exudate Eyes: . Pupils are equal, round, and reactive to light. Conjunctivae and EOM are normal Nose: without d/c or deformity Neck: Neck supple. Gross normal ROM Cardiovascular: Normal rate and regular rhythm.   Pulmonary/Chest: Effort normal and breath sounds without rales or wheezing.  Abd:  Soft, NT, ND, + BS, no organomegaly Neurological: Pt is alert. At baseline orientation, motor grossly intact Skin: Skin is warm. No rashes, other new lesions, no LE edema Psychiatric: Pt behavior is normal without agitation  No other exam findings    Assessment & Plan:

## 2016-06-23 NOTE — Patient Instructions (Addendum)
Please take all new medication as prescribed - the mucinex, zyrtec and flonase  You can also try OTC sudafed as needed  Please take all new medication as prescribed - also the meclizine as needed for dizziness  Please continue all other medications as before, and refills have been done if requested.  Please have the pharmacy call with any other refills you may need.  Please keep your appointments with your specialists as you may have planned  Please call for ENT referral if not at least some better in 3-5 days

## 2016-06-25 ENCOUNTER — Telehealth: Payer: Self-pay | Admitting: Internal Medicine

## 2016-06-25 DIAGNOSIS — H9209 Otalgia, unspecified ear: Secondary | ICD-10-CM

## 2016-06-25 NOTE — Telephone Encounter (Signed)
Pt called stating that her ear is not any better and would like to go ahead and have the referral sent over to ENT.

## 2016-06-25 NOTE — Telephone Encounter (Signed)
Referral done

## 2016-06-27 DIAGNOSIS — J309 Allergic rhinitis, unspecified: Secondary | ICD-10-CM | POA: Insufficient documentation

## 2016-06-27 NOTE — Assessment & Plan Note (Signed)
Overall improved, c/w resolved by exam, will hold on further antibx,  to f/u any worsening symptoms or concerns

## 2016-06-27 NOTE — Assessment & Plan Note (Signed)
Mild to mod, for mucinex otc prn,  to f/u any worsening symptoms or concerns 

## 2016-06-27 NOTE — Assessment & Plan Note (Signed)
D/w pt, no specific tx available,  to f/u any worsening symptoms or concerns

## 2016-06-27 NOTE — Assessment & Plan Note (Signed)
Mild to mod, for zyrtec and flonase asd,  to f/u any worsening symptoms or concerns

## 2016-06-27 NOTE — Assessment & Plan Note (Signed)
Mild, for meclizine prn,  to f/u any worsening symptoms or concerns

## 2016-06-29 DIAGNOSIS — H903 Sensorineural hearing loss, bilateral: Secondary | ICD-10-CM | POA: Insufficient documentation

## 2016-07-12 NOTE — Progress Notes (Deleted)
[name] [address] [place], [place] [address] Phone: [phone] Subjective:    I'm seeing this patient by the request  of:  [name], MD Thank you for the referral as you know  CC:   [id number]  Brenda Davis is a 51 y.o. female coming in with complaint of ***     Past Medical History:  Diagnosis Date  . Anemia   . Chronic kidney disease    STAGE 3 CHRONIC KIDNEY DISEASE SECONDARY TO DIABETIC GLOMERULOSCLEROSIS AND UNCONTROLLED HYPERTENSION - PER OFFICE NOTES DR. Florene Glen [facility].  Marland Kitchen ESRD on dialysis [facility])    "MWF; [address] (05/06/2016)  . Gout   . HCAP (healthcare-associated pneumonia) 05/06/2016  . History of blood transfusion    "related to surgery"  . Hyperlipidemia   . Hypertension   . Morbid obesity ([place])   . Pain    LEFT SHOULDER PAIN - WAS SEEN AT AN URGENT CARE - GIVEN SLING FOR COMFORT AND TOLD ROM AS TOLERATED.  . Type II diabetes mellitus ([place])    "gastric sleeve OR corrected this" (05/06/2016)   Past Surgical History:  Procedure Laterality Date  . AV FISTULA PLACEMENT Left 01/03/2015   Procedure: BRACHIAL CEPHALIC ARTERIOVENOUS  FISTULA CREATION LEFT ARM;  Surgeon: Angelia Mould, MD;  Location: [place];  Service: Vascular;  Laterality: Left;  . CARPAL TUNNEL RELEASE Right   . [place]  . ESOPHAGOGASTRODUODENOSCOPY N/A 09/04/2012   Procedure: ESOPHAGOGASTRODUODENOSCOPY (EGD);  Surgeon: Shann Medal, MD;  [facility];  Service: General;  Laterality: N/A;  PF  . ESOPHAGOGASTRODUODENOSCOPY (EGD) WITH ESOPHAGEAL DILATION N/A 09/29/2012   Procedure: ESOPHAGOGASTRODUODENOSCOPY (EGD) WITH ESOPHAGEAL DILATION;  Surgeon: Milus Banister, MD;  [facility];  Service: Endoscopy;  Laterality: N/A;  . INSERTION OF DIALYSIS CATHETER N/A 01/03/2015   Procedure: INSERTION OF DIALYSIS CATHETER RIGHT INTERNAL JUGULAR;  Surgeon: Angelia Mould, MD;   Location: [place];  Service: Vascular;  Laterality: N/A;  . LAPAROSCOPIC GASTRIC SLEEVE RESECTION N/A 07/19/2012   Procedure: LAPAROSCOPIC SLEEVE GASTRECTOMY with EGD;  Surgeon: Madilyn Hook, DO;  [facility];  Service: General;  Laterality: N/A;  laparoscopic sleeve gastrectomy with EGD  . PERIPHERAL VASCULAR CATHETERIZATION Left 05/23/2015   Procedure: Nolon Stalls;  Surgeon: Conrad [place], MD;  Location: [facility];  Service: Cardiovascular;  Laterality: Left;  upper aRM  . PERIPHERAL VASCULAR CATHETERIZATION Left 05/23/2015   Procedure: Peripheral Vascular Balloon Angioplasty;  Surgeon: Conrad Rahway, MD;  Location: [place] [facility];  Service: Cardiovascular;  Laterality: Left;  av fistula  . TUBAL LIGATION  1993  . UPPER GI ENDOSCOPY N/A 07/19/2012   Procedure: UPPER GI ENDOSCOPY;  Surgeon: Madilyn Hook, DO;  [facility];  Service: General;  Laterality: N/A;   Social History   Social History  . Marital status: Married    Spouse name: N/A  . Number of children: N/A  . Years of education: N/A   Social History Main Topics  . Smoking status: Former Smoker    Packs/day: 1.00    Years: 16.00    Types: Cigarettes    Quit date: 2009  . Smokeless tobacco: Never Used  . Alcohol use Yes  . Drug use: No  . Sexual activity: Yes    Birth control/ protection: Post-menopausal   Other Topics Concern  . Not on file   Social History Narrative  . No narrative on file  Allergies  Allergen Reactions  . Clonidine Derivatives Swelling  . Welchol [Colesevelam Hcl] Nausea Only   Family History  Problem Relation Age of Onset  . Hypertension Mother   . Diabetes Father   . Arthritis Other   . Hyperlipidemia Other   . Cancer Neg Hx   . Heart disease Neg Hx   . Kidney disease Neg Hx   . Stroke Neg Hx     Past medical history, social, surgical and family history all reviewed in electronic medical record.  No pertanent information unless stated regarding to the chief complaint.    Review of Systems:Review of systems updated and as accurate as of 07/12/16  No headache, visual changes, nausea, vomiting, diarrhea, constipation, dizziness, abdominal pain, skin rash, fevers, chills, night sweats, weight loss, swollen lymph nodes, body aches, joint swelling, muscle aches, chest pain, shortness of breath, mood changes.   Objective  There were no vitals taken for this visit. Systems examined below as of 07/12/16   General: No apparent distress alert and oriented x3 mood and affect normal, dressed appropriately.  HEENT: Pupils equal, extraocular movements intact  Respiratory: Patient's speak in full sentences and does not appear short of breath  Cardiovascular: No lower extremity edema, non tender, no erythema  Skin: Warm dry intact with no signs of infection or rash on extremities or on axial skeleton.  Abdomen: Soft nontender  Neuro: Cranial nerves II through XII are intact, neurovascularly intact in all extremities with 2+ DTRs and 2+ pulses.  Lymph: No lymphadenopathy of posterior or anterior cervical chain or axillae bilaterally.  Gait normal with good balance and coordination.  MSK:  Non tender with full range of motion and good stability and symmetric strength and tone of shoulders, elbows, wrist, hip, knee and ankles bilaterally.     Impression and Recommendations:     This case required medical decision making of moderate complexity.      Note: This dictation was prepared with Dragon dictation along with smaller phrase technology. Any transcriptional errors that result from this process are unintentional.

## 2016-07-13 ENCOUNTER — Ambulatory Visit: Payer: 59 | Admitting: Family Medicine

## 2016-07-14 DIAGNOSIS — H9122 Sudden idiopathic hearing loss, left ear: Secondary | ICD-10-CM | POA: Insufficient documentation

## 2016-07-15 ENCOUNTER — Other Ambulatory Visit: Payer: Self-pay | Admitting: Otolaryngology

## 2016-07-15 DIAGNOSIS — H9190 Unspecified hearing loss, unspecified ear: Secondary | ICD-10-CM

## 2016-07-20 ENCOUNTER — Ambulatory Visit (INDEPENDENT_AMBULATORY_CARE_PROVIDER_SITE_OTHER): Payer: 59 | Admitting: Family Medicine

## 2016-07-20 ENCOUNTER — Encounter: Payer: Self-pay | Admitting: Family Medicine

## 2016-07-20 VITALS — BP 150/96 | HR 75 | Temp 98.7°F | Resp 16 | Ht 65.0 in | Wt 279.0 lb

## 2016-07-20 DIAGNOSIS — J069 Acute upper respiratory infection, unspecified: Secondary | ICD-10-CM | POA: Diagnosis not present

## 2016-07-20 DIAGNOSIS — B9789 Other viral agents as the cause of diseases classified elsewhere: Secondary | ICD-10-CM

## 2016-07-20 NOTE — Progress Notes (Signed)
Subjective:    Patient ID: Brenda Davis, female    DOB: 05/16/1965, 51 y.o.   MRN: [id number]  HPI This is a 51 yo female, accompanied by her husband, who presents today with cough and congestion for over a week. Cough with yellow sputum. A lot of post nasal drainage, watery nasal drainage. Started with sore throat. Temperature 98-100.  Some SOB. Taking symbicort prn with improvement of symptoms. Feels significantly better today than she did two days ago. Does not reach or exceed 40 ounce fluid restriction, usually drinks about 30 oz daily.   Was seen 06/18/16 with sinusitis and given doxycycline with good resolution. Was seen by ENT for sensorineural hearing dysfunction.   Past Medical History:  Diagnosis Date  . Anemia   . Chronic kidney disease    STAGE 3 CHRONIC KIDNEY DISEASE SECONDARY TO DIABETIC GLOMERULOSCLEROSIS AND UNCONTROLLED HYPERTENSION - PER OFFICE NOTES DR. Florene Glen [facility].  Marland Kitchen ESRD on dialysis [facility])    "MWF; [address] (05/06/2016)  . Gout   . HCAP (healthcare-associated pneumonia) 05/06/2016  . History of blood transfusion    "related to surgery"  . Hyperlipidemia   . Hypertension   . Morbid obesity ([place])   . Pain    LEFT SHOULDER PAIN - WAS SEEN AT AN URGENT CARE - GIVEN SLING FOR COMFORT AND TOLD ROM AS TOLERATED.  . Type II diabetes mellitus ([place])    "gastric sleeve OR corrected this" (05/06/2016)   Past Surgical History:  Procedure Laterality Date  . AV FISTULA PLACEMENT Left 01/03/2015   Procedure: BRACHIAL CEPHALIC ARTERIOVENOUS  FISTULA CREATION LEFT ARM;  Surgeon: Angelia Mould, MD;  Location: [facility];  Service: Vascular;  Laterality: Left;  . CARPAL TUNNEL RELEASE Right   . [place]  . ESOPHAGOGASTRODUODENOSCOPY N/A 09/04/2012   Procedure: ESOPHAGOGASTRODUODENOSCOPY (EGD);  Surgeon: Shann Medal, MD;  [facility];  Service: General;  Laterality: N/A;  PF  . ESOPHAGOGASTRODUODENOSCOPY  (EGD) WITH ESOPHAGEAL DILATION N/A 09/29/2012   Procedure: ESOPHAGOGASTRODUODENOSCOPY (EGD) WITH ESOPHAGEAL DILATION;  Surgeon: Milus Banister, MD;  [facility];  Service: Endoscopy;  Laterality: N/A;  . INSERTION OF DIALYSIS CATHETER N/A 01/03/2015   Procedure: INSERTION OF DIALYSIS CATHETER RIGHT INTERNAL JUGULAR;  Surgeon: Angelia Mould, MD;  Location: [facility];  Service: Vascular;  Laterality: N/A;  . LAPAROSCOPIC GASTRIC SLEEVE RESECTION N/A 07/19/2012   Procedure: LAPAROSCOPIC SLEEVE GASTRECTOMY with EGD;  Surgeon: Madilyn Hook, DO;  [facility];  Service: General;  Laterality: N/A;  laparoscopic sleeve gastrectomy with EGD  . PERIPHERAL VASCULAR CATHETERIZATION Left 05/23/2015   Procedure: Nolon Stalls;  Surgeon: Conrad [place]on, MD;  Location: [facility];  Service: Cardiovascular;  Laterality: Left;  upper aRM  . PERIPHERAL VASCULAR CATHETERIZATION Left 05/23/2015   Procedure: Peripheral Vascular Balloon Angioplasty;  Surgeon: Conrad [place], MD;  Location: [facility];  Service: Cardiovascular;  Laterality: Left;  av fistula  . TUBAL LIGATION  1993  . UPPER GI ENDOSCOPY N/A 07/19/2012   Procedure: UPPER GI ENDOSCOPY;  Surgeon: Madilyn Hook, DO;  [facility];  Service: General;  Laterality: N/A;   Family History  Problem Relation Age of Onset  . Hypertension Mother   . Diabetes Father   . Arthritis Other   . Hyperlipidemia Other   . Cancer Neg Hx   . Heart disease Neg Hx   . Kidney disease Neg Hx   . Stroke Neg Hx  Social History  Substance Use Topics  . Smoking status: Former Smoker    Packs/day: 1.00    Years: 16.00    Types: Cigarettes    Quit date: 2009  . Smokeless tobacco: Never Used  . Alcohol use Yes    Review of Systems Per HPI    Objective:   Physical Exam  Constitutional: She is oriented to person, place, and time. She appears well-developed and well-nourished. No distress.  HENT:  Head: Normocephalic and atraumatic.    Mouth/Throat: Oropharynx is clear and moist.  Eyes: Conjunctivae are normal.  Neck: Normal range of motion. Neck supple.  Cardiovascular: Normal rate, regular rhythm and normal heart sounds.   Pulmonary/Chest: Effort normal and breath sounds normal.  Frequent, tight cough.   Lymphadenopathy:    She has no cervical adenopathy.  Neurological: She is alert and oriented to person, place, and time.  Skin: Skin is warm and dry. She is not diaphoretic.  Psychiatric: She has a normal mood and affect. Her behavior is normal. Judgment and thought content normal.  Vitals reviewed.        BP (!) 150/96 (BP Location: Right Wrist, Patient Position: Sitting, Cuff Size: Normal)   Pulse 75   Temp 98.7 F (37.1 C) (Oral)   Resp 16   Ht 5' 5"  (1.651 m)   Wt 279 lb (126.6 kg)   SpO2 100%   BMI 46.43 kg/m  Wt Readings from Last 3 Encounters:  07/20/16 279 lb (126.6 kg)  06/23/16 284 lb (128.8 kg)  06/18/16 288 lb (130.6 kg)   BP Readings from Last 3 Encounters:  07/20/16 (!) 150/96  06/23/16 136/88  06/18/16 (!) 142/90     Assessment & Plan:  1. Viral URI with cough - improving - Provided written and verbal information regarding diagnosis and treatment. - continue symbicort, mucinex, tessalon perles, increase fluids to 40 onces - RTC precautions reviewed   Clarene Reamer, FNP-BC  [facility]  07/20/2016 3:29 PM

## 2016-07-20 NOTE — Patient Instructions (Signed)
Continue mucinex and Symbicort  If not better in 3-4 days, let me know

## 2016-07-25 ENCOUNTER — Encounter: Payer: Self-pay | Admitting: Family Medicine

## 2016-07-25 ENCOUNTER — Ambulatory Visit (INDEPENDENT_AMBULATORY_CARE_PROVIDER_SITE_OTHER): Payer: 59 | Admitting: Family Medicine

## 2016-07-25 VITALS — BP 158/80 | HR 69 | Temp 98.2°F | Wt 279.4 lb

## 2016-07-25 DIAGNOSIS — R05 Cough: Secondary | ICD-10-CM | POA: Diagnosis not present

## 2016-07-25 DIAGNOSIS — J4551 Severe persistent asthma with (acute) exacerbation: Secondary | ICD-10-CM

## 2016-07-25 DIAGNOSIS — J4541 Moderate persistent asthma with (acute) exacerbation: Secondary | ICD-10-CM

## 2016-07-25 DIAGNOSIS — R059 Cough, unspecified: Secondary | ICD-10-CM

## 2016-07-25 MED ORDER — ALBUTEROL SULFATE HFA 108 (90 BASE) MCG/ACT IN AERS: 1.0000 | INHALATION_SPRAY | Freq: Four times a day (QID) | RESPIRATORY_TRACT | 0 refills | Status: DC | PRN

## 2016-07-25 MED ORDER — BUDESONIDE-FORMOTEROL FUMARATE 160-4.5 MCG/ACT IN AERO: 2.0000 | INHALATION_SPRAY | Freq: Two times a day (BID) | RESPIRATORY_TRACT | Status: DC

## 2016-07-25 MED ORDER — DOXYCYCLINE HYCLATE 100 MG PO TABS: 100.0000 mg | ORAL_TABLET | Freq: Two times a day (BID) | ORAL | 0 refills | Status: DC

## 2016-07-25 NOTE — Assessment & Plan Note (Signed)
Given ESRD, sputum, worsening, would treat though no focal dec in BS.  Okay for outpatient f/u.  symbicort twice a day, rinse after use.  Albuterol up to 4 times a day.  Start doxycycline.   Update [place] as needed.  She agrees.

## 2016-07-25 NOTE — Progress Notes (Signed)
ESRD MWF HD.  No missed days.  Still on baseline meds.  Seen earlier this week, continued on tessalon and mucinex.    H/o CAP noted, about 2 months ago. She had gotten better from that but then sx got worse again.   Still on symbicort daily, 8 puffs a day per patient report.  Not on albuterol. symbicort seemed to help with wheeze prev, until last night.    Cough has gotten worse since earlier this week.  Coughing fits to the point of vomiting.  Left work yesterday.  Recently with dark yellow sputum.  Still with some discolored sputum.  Not on abx currently.    Meds, vitals, and allergies reviewed.   ROS: Per HPI unless specifically indicated in ROS section   GEN: nad, alert and oriented HEENT: mucous membranes moist, OP wnl, sinuses not ttp NECK: supple w/o LA CV: rrr.   PULM: diffuse B upper lobe ronchi, no inc wob, cough noted.  EXT: no edema SKIN: no acute rash abd soft, normal BS HD site intact on RUE

## 2016-07-25 NOTE — Patient Instructions (Addendum)
symbicort twice a day, rinse after use.  Albuterol up to 4 times a day.  Start doxycycline.   Update [place] as needed.  Take care.  You should gradually improve.  If not then let us know.

## 2016-08-04 ENCOUNTER — Ambulatory Visit: Payer: 59 | Admitting: Family Medicine

## 2016-08-14 ENCOUNTER — Other Ambulatory Visit: Payer: Self-pay | Admitting: Otolaryngology

## 2016-08-14 ENCOUNTER — Ambulatory Visit
Admission: RE | Admit: 2016-08-14 | Discharge: 2016-08-14 | Disposition: A | Discharge status: Home / Self Care | Payer: 59 | Source: Ambulatory Visit | Attending: Otolaryngology | Admitting: Otolaryngology

## 2016-08-14 DIAGNOSIS — H9190 Unspecified hearing loss, unspecified ear: Secondary | ICD-10-CM

## 2016-08-20 ENCOUNTER — Other Ambulatory Visit: Payer: Self-pay | Admitting: *Deleted

## 2016-08-25 ENCOUNTER — Ambulatory Visit (HOSPITAL_COMMUNITY): Admission: RE | Admit: 2016-08-25 | Payer: 59 | Source: Ambulatory Visit | Admitting: Surgery

## 2016-08-25 ENCOUNTER — Encounter (HOSPITAL_COMMUNITY): Admission: RE | Payer: Self-pay | Source: Ambulatory Visit

## 2016-08-25 SURGERY — A/V FISTULAGRAM
Anesthesia: LOCAL | Laterality: Left

## 2016-09-07 ENCOUNTER — Telehealth: Payer: Self-pay | Admitting: Cardiovascular Disease

## 2016-09-07 NOTE — Telephone Encounter (Signed)
Received records from [facility] for appointment on 09/24/16 with Dr Oval Linsey.  Records put with Dr Blenda Mounts schedule for 09/24/16. lp

## 2016-09-14 ENCOUNTER — Ambulatory Visit: Payer: Self-pay

## 2016-09-14 ENCOUNTER — Ambulatory Visit (INDEPENDENT_AMBULATORY_CARE_PROVIDER_SITE_OTHER): Payer: 59 | Admitting: Family Medicine

## 2016-09-14 ENCOUNTER — Encounter: Payer: Self-pay | Admitting: Family Medicine

## 2016-09-14 VITALS — BP 132/84 | HR 69 | Ht 65.0 in | Wt 287.0 lb

## 2016-09-14 DIAGNOSIS — IMO0002 Reserved for concepts with insufficient information to code with codable children: Secondary | ICD-10-CM

## 2016-09-14 DIAGNOSIS — M65332 Trigger finger, left middle finger: Secondary | ICD-10-CM

## 2016-09-14 DIAGNOSIS — M65321 Trigger finger, right index finger: Secondary | ICD-10-CM | POA: Insufficient documentation

## 2016-09-14 DIAGNOSIS — M6530 Trigger finger, unspecified finger: Secondary | ICD-10-CM | POA: Diagnosis not present

## 2016-09-14 NOTE — Assessment & Plan Note (Signed)
Discussed with patient about both trigger fingers. This is from repetitive activity. Patient does do a significant amount typing, given some very mild home exercises and stretches, we'll do bracing at night. Tolerated the injections significantly on both sides. Patient has had a release done on the right side of the ring finger previously. Patient knows that this may be repeated. Follow-up again in 3-4 weeks.

## 2016-09-14 NOTE — Progress Notes (Signed)
[facility] [place], [place] [address] Phone: [phone] Subjective:    I'm seeing this patient by the request  of:  [name], MD  CC: trigger finger   ION:[id number]  Brenda Davis is a 51 y.o. female coming in with complaint of bilateral trigger fingers. Patient states the right index finger and the left middle finger gets stuck from time to time. Affecting daily activities. Patient also needs it for work. Patient finds it very difficult and seems to be worsening in the mornings.     Past Medical History:  Diagnosis Date  . Anemia   . Chronic kidney disease    STAGE 3 CHRONIC KIDNEY DISEASE SECONDARY TO DIABETIC GLOMERULOSCLEROSIS AND UNCONTROLLED HYPERTENSION - PER OFFICE NOTES DR. Florene Glen [facility].  Marland Kitchen ESRD on dialysis [facility])    "MWF; [address] (05/06/2016)  . Gout   . HCAP (healthcare-associated pneumonia) 05/06/2016  . History of blood transfusion    "related to surgery"  . Hyperlipidemia   . Hypertension   . Morbid obesity ([place])   . Pain    LEFT SHOULDER PAIN - WAS SEEN AT AN URGENT CARE - GIVEN SLING FOR COMFORT AND TOLD ROM AS TOLERATED.  . Type II diabetes mellitus ([place])    "gastric sleeve OR corrected this" (05/06/2016)   Past Surgical History:  Procedure Laterality Date  . AV FISTULA PLACEMENT Left 01/03/2015   Procedure: BRACHIAL CEPHALIC ARTERIOVENOUS  FISTULA CREATION LEFT ARM;  Surgeon: Angelia Mould, MD;  Location: [facility];  Service: Vascular;  Laterality: Left;  . CARPAL TUNNEL RELEASE Right   . [place]  . ESOPHAGOGASTRODUODENOSCOPY N/A 09/04/2012   Procedure: ESOPHAGOGASTRODUODENOSCOPY (EGD);  Surgeon: Shann Medal, MD;  [facility];  Service: General;  Laterality: N/A;  PF  . ESOPHAGOGASTRODUODENOSCOPY (EGD) WITH ESOPHAGEAL DILATION N/A 09/29/2012   Procedure: ESOPHAGOGASTRODUODENOSCOPY (EGD) WITH ESOPHAGEAL DILATION;  Surgeon: Milus Banister, MD;  [facility];  Service: Endoscopy;  Laterality: N/A;  . INSERTION OF DIALYSIS CATHETER N/A 01/03/2015   Procedure: INSERTION OF DIALYSIS CATHETER RIGHT INTERNAL JUGULAR;  Surgeon: Angelia Mould, MD;  Location: [facility];  Service: Vascular;  Laterality: N/A;  . LAPAROSCOPIC GASTRIC SLEEVE RESECTION N/A 07/19/2012   Procedure: LAPAROSCOPIC SLEEVE GASTRECTOMY with EGD;  Surgeon: Madilyn Hook, DO;  [facility];  Service: General;  Laterality: N/A;  laparoscopic sleeve gastrectomy with EGD  . PERIPHERAL VASCULAR CATHETERIZATION Left 05/23/2015   Procedure: Nolon Stalls;  Surgeon: Conrad [place], MD;  Location: [facility];  Service: Cardiovascular;  Laterality: Left;  upper aRM  . PERIPHERAL VASCULAR CATHETERIZATION Left 05/23/2015   Procedure: Peripheral Vascular Balloon Angioplasty;  Surgeon: Conrad [place], MD;  Location: [facility] [facility];  Service: Cardiovascular;  Laterality: Left;  av fistula  . TUBAL LIGATION  1993  . UPPER GI ENDOSCOPY N/A 07/19/2012   Procedure: UPPER GI ENDOSCOPY;  Surgeon: Madilyn Hook, DO;  [facility];  Service: General;  Laterality: N/A;   Social History   Social History  . Marital status: Married    Spouse name: N/A  . Number of children: N/A  . Years of education: N/A   Social History Main Topics  . Smoking status: Former Smoker    Packs/day: 1.00    Years: 16.00    Types: Cigarettes    Quit date: 2009  . Smokeless tobacco: Never Used  . Alcohol use Yes  . Drug  use: No  . Sexual activity: Yes    Birth control/ protection: Post-menopausal   Other Topics Concern  . None   Social History Narrative  . None   Allergies  Allergen Reactions  . Clonidine Derivatives Swelling  . Welchol [Colesevelam Hcl] Nausea Only   Family History  Problem Relation Age of Onset  . Hypertension Mother   . Diabetes Father   . Arthritis Other   . Hyperlipidemia Other   . Cancer Neg Hx   . Heart disease Neg Hx   . Kidney disease  Neg Hx   . Stroke Neg Hx      Past medical history, social, surgical and family history all reviewed in electronic medical record.  No pertanent information unless stated regarding to the chief complaint.   Review of Systems:Review of systems updated and as accurate as of 09/14/16  No headache, visual changes, nausea, vomiting, diarrhea, constipation, dizziness, abdominal pain, skin rash, fevers, chills, night sweats, weight loss, swollen lymph nodes, body aches, joint swelling, muscle aches, chest pain, shortness of breath, mood changes.   Objective  Height 5\' 5"  (1.651 m), weight 287 lb (130.2 kg). Systems examined below as of 09/14/16   General: No apparent distress alert and oriented x3 mood and affect normal, dressed appropriately.  HEENT: Pupils equal, extraocular movements intact  Respiratory: Patient's speak in full sentences and does not appear short of breath  Cardiovascular: No lower extremity edema, non tender, no erythema  Skin: Warm dry intact with no signs of infection or rash on extremities or on axial skeleton.  Abdomen: Soft nontender  Neuro: Cranial nerves II through XII are intact, neurovascularly intact in all extremities with 2+ DTRs and 2+ pulses.  Lymph: No lymphadenopathy of posterior or anterior cervical chain or axillae bilaterally.  Gait normal with good balance and coordination.  MSK:  Non tender with full range of motion and good stability and symmetric strength and tone of shoulders, elbows, wrist, hip, knee and ankles bilaterally.  Hand exam shows Patient does have triggering of the right index finger at the A2 pulley and patient does have triggering of the left middle finger with nodule noted at the A2 pulley. Severely tender to palpation bilaterally. Surgical changes noted of the left ring finger. Neurovascularly intact distally with full strength of the wrist.  Procedure: Real-time Ultrasound Guided Injection of left flexor tendon sheath of the middle  finger Device: GE Logiq Q7 Ultrasound guided injection is preferred based studies that show increased duration, increased effect, greater accuracy, decreased procedural pain, increased response rate, and decreased cost with ultrasound guided versus blind injection.  Verbal informed consent obtained.  Time-out conducted.  Noted no overlying erythema, induration, or other signs of local infection.  Skin prepped in a sterile fashion.  Local anesthesia: Topical Ethyl chloride.  With sterile technique and under real time ultrasound guidance:  With a 25-gauge half-inch needle patient was injected with a total of 0.5 mL of 0.5% Marcaine and 0.5 mL of Kenalog 40 mg/dL. Completed without difficulty  Pain immediately resolved suggesting accurate placement of the medication.  Advised to call if fevers/chills, erythema, induration, drainage, or persistent bleeding.  Images permanently stored and available for review in the ultrasound unit.  Impression: Technically successful ultrasound guided injection.  Procedure: Real-time Ultrasound Guided Injection of  right index flexor tendon sheath Device: GE Logiq Q7 Ultrasound guided injection is preferred based studies that show increased duration, increased effect, greater accuracy, decreased procedural pain, increased response rate, and decreased cost  with ultrasound guided versus blind injection.  Verbal informed consent obtained.  Time-out conducted.  Noted no overlying erythema, induration, or other signs of local infection.  Skin prepped in a sterile fashion.  Local anesthesia: Topical Ethyl chloride.  With sterile technique and under real time ultrasound guidance: With a 25-gauge half-inch heel was injected with a total of 0.5 mL of 0.5% Marcaine and 0.5 mL of Kenalog 40 mg/dL.  Completed without difficulty  Pain immediately resolved suggesting accurate placement of the medication.  Advised to call if fevers/chills, erythema, induration, drainage, or  persistent bleeding.  Images permanently stored and available for review in the ultrasound unit.  Impression: Technically successful ultrasound guided injection.   Impression and Recommendations:     This case required medical decision making of moderate complexity.      Note: This dictation was prepared with Dragon dictation along with smaller phrase technology. Any transcriptional errors that result from this process are unintentional.

## 2016-09-14 NOTE — Patient Instructions (Signed)
Good to see yo u We injected both trigger fingers today  Ice can help  Expect the popping to happen a little more for 1-2 weeks then go away.  Wear braces at night See me again in 3 weeks if not perfect or otherwise see me when yo uneed me.

## 2016-09-18 ENCOUNTER — Encounter: Payer: Self-pay | Admitting: Nephrology

## 2016-09-24 ENCOUNTER — Ambulatory Visit (INDEPENDENT_AMBULATORY_CARE_PROVIDER_SITE_OTHER): Payer: 59 | Admitting: Cardiovascular Disease

## 2016-09-24 ENCOUNTER — Encounter: Payer: Self-pay | Admitting: Cardiovascular Disease

## 2016-09-24 VITALS — BP 150/94 | HR 70 | Ht 65.0 in | Wt 281.8 lb

## 2016-09-24 DIAGNOSIS — R001 Bradycardia, unspecified: Secondary | ICD-10-CM

## 2016-09-24 DIAGNOSIS — I10 Essential (primary) hypertension: Secondary | ICD-10-CM

## 2016-09-24 DIAGNOSIS — R002 Palpitations: Secondary | ICD-10-CM | POA: Diagnosis not present

## 2016-09-24 DIAGNOSIS — R42 Dizziness and giddiness: Secondary | ICD-10-CM

## 2016-09-24 HISTORY — DX: Palpitations: R00.2

## 2016-09-24 LAB — COMPREHENSIVE METABOLIC PANEL
ALT: 15 IU/L (ref 0–32)
AST: 17 IU/L (ref 0–40)
Albumin/Globulin Ratio: 1.7 (ref 1.2–2.2)
Albumin: 4.5 g/dL (ref 3.5–5.5)
Alkaline Phosphatase: 59 IU/L (ref 39–117)
BUN/Creatinine Ratio: 3 — ABNORMAL LOW (ref 9–23)
BUN: 26 mg/dL — ABNORMAL HIGH (ref 6–24)
Bilirubin Total: 0.3 mg/dL (ref 0.0–1.2)
CO2: 28 mmol/L (ref 20–29)
Calcium: 10.2 mg/dL (ref 8.7–10.2)
Chloride: 94 mmol/L — ABNORMAL LOW (ref 96–106)
Creatinine, Ser: 7.54 mg/dL — ABNORMAL HIGH (ref 0.57–1.00)
GFR calc Af Amer: 7 mL/min/{1.73_m2} — ABNORMAL LOW (ref >59)
GFR calc non Af Amer: 6 mL/min/{1.73_m2} — ABNORMAL LOW (ref >59)
Globulin, Total: 2.7 g/dL (ref 1.5–4.5)
Glucose: 127 mg/dL — ABNORMAL HIGH (ref 65–99)
Potassium: 5.4 mmol/L — ABNORMAL HIGH (ref 3.5–5.2)
Sodium: 140 mmol/L (ref 134–144)
Total Protein: 7.2 g/dL (ref 6.0–8.5)

## 2016-09-24 LAB — T4, FREE: Free T4: 1.28 ng/dL (ref 0.82–1.77)

## 2016-09-24 LAB — TSH: TSH: 2.02 u[IU]/mL (ref 0.450–4.500)

## 2016-09-24 LAB — MAGNESIUM: Magnesium: 2.4 mg/dL — ABNORMAL HIGH (ref 1.6–2.3)

## 2016-09-24 NOTE — Patient Instructions (Signed)
Medication Instructions:  Your physician recommends that you continue on your current medications as directed. Please refer to the Current Medication list given to you today.  Labwork: CMET/TSH/FT4/MAGNESIUM TODAY   Testing/Procedures: Your physician has recommended that you wear a holter monitor. Holter monitors are medical devices that record the heart's electrical activity. Doctors most often use these monitors to diagnose arrhythmias. Arrhythmias are problems with the speed or rhythm of the heartbeat. The monitor is a small, portable device. You can wear one while you do your normal daily activities. This is usually used to diagnose what is causing palpitations/syncope (passing out). [place]  Follow-Up: Your physician recommends that you schedule a follow-up appointment in: 4-6 WEEKS   If you need a refill on your cardiac medications before your next appointment, please call your pharmacy.

## 2016-09-24 NOTE — Progress Notes (Signed)
Cardiology Office Note   Date:  09/24/2016   ID:  Brenda Davis, DOB [birth year], MRN [id number]  PCP:  [name], MD  Cardiologist:   Skeet Latch, MD   No chief complaint on file.     History of Present Illness: Brenda Davis is a 51 y.o. female With diabetes, hypertension, hyperlipidemia, ESRD on HD, who presents for an evaluation of palpitations.  She saw Dr. Marval Regal and reported palpitations. She was referred to cardiology for further evaluation.  Whenever she is on dialysis she has palpitations during the last two hours.  It feels like her heart is fluttering and she feels like she is going to pass out.  It gets progressively worse during that time period and she gets short of breath.  Once she stops HD the symptoms subside.  Her BP is typically in the 291B systolic.  Drops to the 100s while on hemodialysis and she thinks that this may be contributing. She has not yet taken her blood pressure medication today. The palpitations never occur when she is off the dialysis machine. She did have an episode last week where her heart rate was in the 40s and she was lightheaded and dizzy. This lasted for a few minutes prior to improving. She denies any syncope.  She also has not noted any chest pain, shortness of breath, lower extremity edema, orthopnea, or PND.  Brenda Davis is on the donor list for kidney transplantation. He is currently trying to lose 30 pounds by December. She has been working with diet and riding an exercise bike.  She had an echo 01/04/15 revealed LVEF 65-70% with grade 2 diastolic dysfunction and mild mitral regurgitation.   Past Medical History:  Diagnosis Date  . Anemia   . Chronic kidney disease    STAGE 3 CHRONIC KIDNEY DISEASE SECONDARY TO DIABETIC GLOMERULOSCLEROSIS AND UNCONTROLLED HYPERTENSION - PER OFFICE NOTES DR. Florene Glen [facility].  Marland Kitchen ESRD on dialysis [facility])    "MWF; [address] (05/06/2016)  . Gout    . HCAP (healthcare-associated pneumonia) 05/06/2016  . History of blood transfusion    "related to surgery"  . Hyperlipidemia   . Hypertension   . Morbid obesity ([place])   . Pain    LEFT SHOULDER PAIN - WAS SEEN AT AN URGENT CARE - GIVEN SLING FOR COMFORT AND TOLD ROM AS TOLERATED.  Marland Kitchen Palpitations 09/24/2016  . Type II diabetes mellitus ([place])    "gastric sleeve OR corrected this" (05/06/2016)    Past Surgical History:  Procedure Laterality Date  . AV FISTULA PLACEMENT Left 01/03/2015   Procedure: BRACHIAL CEPHALIC ARTERIOVENOUS  FISTULA CREATION LEFT ARM;  Surgeon: Angelia Mould, MD;  Location: [place];  Service: Vascular;  Laterality: Left;  . CARPAL TUNNEL RELEASE Right   . [place]  . ESOPHAGOGASTRODUODENOSCOPY N/A 09/04/2012   Procedure: ESOPHAGOGASTRODUODENOSCOPY (EGD);  Surgeon: Shann Medal, MD;  [facility];  Service: General;  Laterality: N/A;  PF  . ESOPHAGOGASTRODUODENOSCOPY (EGD) WITH ESOPHAGEAL DILATION N/A 09/29/2012   Procedure: ESOPHAGOGASTRODUODENOSCOPY (EGD) WITH ESOPHAGEAL DILATION;  Surgeon: Milus Banister, MD;  [facility];  Service: Endoscopy;  Laterality: N/A;  . INSERTION OF DIALYSIS CATHETER N/A 01/03/2015   Procedure: INSERTION OF DIALYSIS CATHETER RIGHT INTERNAL JUGULAR;  Surgeon: Angelia Mould, MD;  Location: [place];  Service: Vascular;  Laterality: N/A;  . LAPAROSCOPIC GASTRIC SLEEVE RESECTION N/A 07/19/2012   Procedure: LAPAROSCOPIC SLEEVE GASTRECTOMY with EGD;  Surgeon: Aaron Edelman  Lilyan Punt, DO;  [facility];  Service: General;  Laterality: N/A;  laparoscopic sleeve gastrectomy with EGD  . PERIPHERAL VASCULAR CATHETERIZATION Left 05/23/2015   Procedure: Nolon Stalls;  Surgeon: Conrad Blanchardville, MD;  Location: [place] CV [facility];  Service: Cardiovascular;  Laterality: Left;  upper aRM  . PERIPHERAL VASCULAR CATHETERIZATION Left 05/23/2015   Procedure: Peripheral Vascular Balloon Angioplasty;  Surgeon: Conrad Cibola,  MD;  Location: [place] CV [facility];  Service: Cardiovascular;  Laterality: Left;  av fistula  . TUBAL LIGATION  1993  . UPPER GI ENDOSCOPY N/A 07/19/2012   Procedure: UPPER GI ENDOSCOPY;  Surgeon: Madilyn Hook, DO;  [facility];  Service: General;  Laterality: N/A;     Current Outpatient Prescriptions  Medication Sig Dispense Refill  . amLODipine (NORVASC) 10 MG tablet Take 1 tablet (10 mg total) by mouth daily. 30 tablet 0  . calcium acetate (PHOSLO) 667 MG capsule Take 2 capsules (1,334 mg total) by mouth 3 (three) times daily with meals. 180 capsule 0  . irbesartan (AVAPRO) 300 MG tablet Take 300 mg by mouth at bedtime.  3  . multivitamin (RENA-VIT) TABS tablet Take 1 tablet by mouth at bedtime. 30 tablet 0  . nebivolol (BYSTOLIC) 5 MG tablet Take 2 tablets (10 mg total) by mouth 2 (two) times daily.    . polyethylene glycol (MIRALAX / GLYCOLAX) packet Take 17 g by mouth daily as needed. 14 each 0  . thiamine (VITAMIN B-1) 100 MG tablet Take 1 tablet (100 mg total) by mouth daily. 90 tablet 3   No current facility-administered medications for this visit.     Allergies:   Clonidine derivatives and Welchol [colesevelam hcl]    Social History:  The patient  reports that she quit smoking about 9 years ago. Her smoking use included Cigarettes. She has a 16.00 pack-year smoking history. She has never used smokeless tobacco. She reports that she drinks alcohol. She reports that she does not use drugs.   Family History:  The patient's family history includes Arthritis in her other; Diabetes in her father; Hyperlipidemia in her other; Hypertension in her mother; Mitral valve prolapse in her mother.    ROS:  Please see the history of present illness.   Otherwise, review of systems are positive for lost hearing in L ear.   All other systems are reviewed and negative.    PHYSICAL EXAM: VS:  BP (!) 150/94 (BP Location: Right Arm, Patient Position: Sitting, Cuff Size: Large)   Pulse 70   Ht  5' 5"  (1.651 m)   Wt 127.8 kg (281 lb 12.8 oz)   BMI 46.89 kg/m  , BMI Body mass index is 46.89 kg/m. GENERAL:  Well appearing HEENT:  Pupils equal round and reactive, fundi not visualized, oral mucosa unremarkable NECK:  No jugular venous distention, waveform within normal limits, carotid upstroke brisk and symmetric, no bruits, no thyromegaly LUNGS:  Clear to auscultation bilaterally HEART:  RRR.  PMI not displaced or sustained,S1 and S2 within normal limits, no S3, no S4, no clicks, no rubs, no murmurs ABD:  Flat, positive bowel sounds normal in frequency in pitch, no bruits, no rebound, no guarding, no midline pulsatile mass, no hepatomegaly, no splenomegaly EXT:  2 plus pulses throughout, no edema, no cyanosis no clubbing.  L UE fistula SKIN:  No rashes no nodules NEURO:  Cranial nerves II through XII grossly intact, motor grossly intact throughout PSYCH:  Cognitively intact, oriented to person place and time  ]  EKG:  EKG is ordered today. The ekg ordered today demonstrates sinus rhythm rate 70 bpm.    Echo 01/04/15: Study Conclusions  - Left ventricle: The cavity size was normal. Wall thickness was   normal. Systolic function was vigorous. The estimated ejection   fraction was in the range of 65% to 70%. Features are consistent   with a pseudonormal left ventricular filling pattern, with   concomitant abnormal relaxation and increased filling pressure   (grade 2 diastolic dysfunction). - Mitral valve: There was mild regurgitation. - Pericardium, extracardiac: A trivial pericardial effusion was   identified.  Recent Labs: 05/06/2016: Magnesium 2.3 05/07/2016: ALT 32 05/12/2016: Pro B Natriuretic peptide (BNP) 423.0 05/18/2016: BUN 59; Creatinine, Ser 11.61; Hemoglobin 9.9; Platelets 358.0; Potassium 4.5; Sodium 139   09/02/16: Hemoglobin 11, hematocrit 33  08/12/16: Sodium 140, potassium 5.4, BUN 47, creatinine 11.0   Lipid Panel    Component Value Date/Time   CHOL  201 (H) 12/05/2015 1000   TRIG 86.0 12/05/2015 1000   HDL 58.10 12/05/2015 1000   CHOLHDL 3 12/05/2015 1000   VLDL 17.2 12/05/2015 1000   LDLCALC 125 (H) 12/05/2015 1000   LDLDIRECT 161.6 11/28/2012 1042      Wt Readings from Last 3 Encounters:  09/24/16 127.8 kg (281 lb 12.8 oz)  09/14/16 130.2 kg (287 lb)  07/25/16 126.7 kg (279 lb 6.4 oz)      ASSESSMENT AND PLAN:  # Palpitations:  # Bradycardia: # Dizziness: We will get a 48 hour Holter to better assess what The underlying rhythm is. She already had a CBC. We will check a CMP, magnesium, TSH, and free T4.  # Hypertension: BP today is above goal.  However, She has not yet taken her blood pressure medication today. It is well-controlled when she takes it.  Her blood pressure gets low with dialysis.  Continue amlodipine, irbesartan and nebivolol.  # Hyperlipidemia: LDL is above goal.  Will address at follow up.    Current medicines are reviewed at length with the patient today.  The patient does not have concerns regarding medicines.  The following changes have been made:  no change  Labs/ tests ordered today include:   Orders Placed This Encounter  Procedures  . Comprehensive metabolic panel  . T4, free  . TSH  . Magnesium  . Holter monitor - 48 hour  . EKG 12-Lead     Disposition:   FU with Jamarri Vuncannon C. Oval Linsey, MD, [facility] in 1 month    This note was written with the assistance of speech recognition software.  Please excuse any transcriptional errors.  Signed, Heriberto Stmartin C. Oval Linsey, MD, [facility]  09/24/2016 9:26 AM    [facility]

## 2016-09-30 ENCOUNTER — Telehealth: Payer: Self-pay | Admitting: Cardiovascular Disease

## 2016-09-30 NOTE — Telephone Encounter (Signed)
F/u message  Pt returning RN call. Please call back to discuss.

## 2016-09-30 NOTE — Telephone Encounter (Signed)
-----   Message from Skeet Latch, MD sent at 09/28/2016  1:26 PM EDT ----- Potassium and magnesium were a little.  This will be managed at dialysis.  Normal thyroid function.

## 2016-09-30 NOTE — Telephone Encounter (Signed)
Released in my chart with Dr Blenda Mounts comments attached and left detailed message, ok per [facility]

## 2016-10-06 ENCOUNTER — Ambulatory Visit: Payer: 59 | Admitting: Family Medicine

## 2016-10-08 ENCOUNTER — Ambulatory Visit (INDEPENDENT_AMBULATORY_CARE_PROVIDER_SITE_OTHER): Payer: 59

## 2016-10-08 DIAGNOSIS — R002 Palpitations: Secondary | ICD-10-CM

## 2016-10-22 ENCOUNTER — Ambulatory Visit: Payer: 59 | Admitting: Cardiovascular Disease

## 2016-10-31 NOTE — Progress Notes (Deleted)
Cardiology Office Note   Date:  10/31/2016   ID:  Brenda Davis, DOB 01/28/1965, MRN [id number]  PCP:  Brenda Lima, MD  Cardiologist:   Brenda Latch, MD   No chief complaint on file.     History of Present Illness: Brenda Davis is a 51 y.o. female With diabetes, hypertension, hyperlipidemia, ESRD on HD, who presents for follow up.  She was initially seen 09/2016 for an evaluation of palpitations.  She saw Dr. Marval Davis and reported palpitations. She was referred to cardiology for further evaluation.  Whenever she is on dialysis she has palpitations during the last two hours.  It feels like her heart is fluttering and she feels like she is going to pass out.  It gets progressively worse during that time period and she gets short of breath.  Once she stops HD the symptoms subside.  Her BP is typically in the 650P systolic.  Drops to the 100s while on hemodialysis and she thinks that this may be contributing. She has not yet taken her blood pressure medication today. The palpitations never occur when she is off the dialysis machine. She did have an episode last week where her heart rate was in the 40s and she was lightheaded and dizzy. This lasted for a few minutes prior to improving. She denies any syncope.  She also has not noted any chest pain, shortness of breath, lower extremity edema, orthopnea, or PND.  Brenda Davis is on the donor list for kidney transplantation. He is currently trying to lose 30 pounds by December. She has been working with diet and riding an exercise bike.  She had an echo 01/04/15 revealed LVEF 65-70% with grade 2 diastolic dysfunction and mild mitral regurgitation.   Past Medical History:  Diagnosis Date  . Anemia   . Chronic kidney disease    STAGE 3 CHRONIC KIDNEY DISEASE SECONDARY TO DIABETIC GLOMERULOSCLEROSIS AND UNCONTROLLED HYPERTENSION - PER OFFICE NOTES DR. Florene Glen -[facility].  Marland Kitchen ESRD on dialysis  [facility]    "MWF; [address] (05/06/2016)  . Gout   . HCAP (healthcare-associated pneumonia) 05/06/2016  . History of blood transfusion    "related to surgery"  . Hyperlipidemia   . Hypertension   . Morbid obesity ([place])   . Pain    LEFT SHOULDER PAIN - WAS SEEN AT AN URGENT CARE - GIVEN SLING FOR COMFORT AND TOLD ROM AS TOLERATED.  Marland Kitchen Palpitations 09/24/2016  . Type II diabetes mellitus ([place])    "gastric sleeve OR corrected this" (05/06/2016)    Past Surgical History:  Procedure Laterality Date  . AV FISTULA PLACEMENT Left 01/03/2015   Procedure: BRACHIAL CEPHALIC ARTERIOVENOUS  FISTULA CREATION LEFT ARM;  Surgeon: Angelia Mould, MD;  Location: [place];  Service: Vascular;  Laterality: Left;  . CARPAL TUNNEL RELEASE Right   . [place]  . ESOPHAGOGASTRODUODENOSCOPY N/A 09/04/2012   Procedure: ESOPHAGOGASTRODUODENOSCOPY (EGD);  Surgeon: Shann Medal, MD;  [facility];  Service: General;  Laterality: N/A;  PF  . ESOPHAGOGASTRODUODENOSCOPY (EGD) WITH ESOPHAGEAL DILATION N/A 09/29/2012   Procedure: ESOPHAGOGASTRODUODENOSCOPY (EGD) WITH ESOPHAGEAL DILATION;  Surgeon: Milus Banister, MD;  [facility];  Service: Endoscopy;  Laterality: N/A;  . INSERTION OF DIALYSIS CATHETER N/A 01/03/2015   Procedure: INSERTION OF DIALYSIS CATHETER RIGHT INTERNAL JUGULAR;  Surgeon: Angelia Mould, MD;  Location: [place];  Service: Vascular;  Laterality: N/A;  . LAPAROSCOPIC GASTRIC SLEEVE RESECTION N/A 07/19/2012  Procedure: LAPAROSCOPIC SLEEVE GASTRECTOMY with EGD;  Surgeon: Madilyn Hook, DO;  [facility];  Service: General;  Laterality: N/A;  laparoscopic sleeve gastrectomy with EGD  . PERIPHERAL VASCULAR CATHETERIZATION Left 05/23/2015   Procedure: Nolon Stalls;  Surgeon: Conrad Stowell, MD;  Location: [place] CV [facility];  Service: Cardiovascular;  Laterality: Left;  upper aRM  . PERIPHERAL VASCULAR CATHETERIZATION Left 05/23/2015   Procedure: Peripheral  Vascular Balloon Angioplasty;  Surgeon: Conrad Cascade, MD;  Location: [place] CV [facility];  Service: Cardiovascular;  Laterality: Left;  av fistula  . TUBAL LIGATION  1993  . UPPER GI ENDOSCOPY N/A 07/19/2012   Procedure: UPPER GI ENDOSCOPY;  Surgeon: Madilyn Hook, DO;  [facility];  Service: General;  Laterality: N/A;     Current Outpatient Prescriptions  Medication Sig Dispense Refill  . amLODipine (NORVASC) 10 MG tablet Take 1 tablet (10 mg total) by mouth daily. 30 tablet 0  . calcium acetate (PHOSLO) 667 MG capsule Take 2 capsules (1,334 mg total) by mouth 3 (three) times daily with meals. 180 capsule 0  . irbesartan (AVAPRO) 300 MG tablet Take 300 mg by mouth at bedtime.  3  . multivitamin (RENA-VIT) TABS tablet Take 1 tablet by mouth at bedtime. 30 tablet 0  . nebivolol (BYSTOLIC) 5 MG tablet Take 2 tablets (10 mg total) by mouth 2 (two) times daily.    . polyethylene glycol (MIRALAX / GLYCOLAX) packet Take 17 g by mouth daily as needed. 14 each 0  . thiamine (VITAMIN B-1) 100 MG tablet Take 1 tablet (100 mg total) by mouth daily. 90 tablet 3   No current facility-administered medications for this visit.     Allergies:   Clonidine derivatives and Welchol [colesevelam hcl]    Social History:  The patient  reports that she quit smoking about 9 years ago. Her smoking use included Cigarettes. She has a 16.00 pack-year smoking history. She has never used smokeless tobacco. She reports that she drinks alcohol. She reports that she does not use drugs.   Family History:  The patient's family history includes Arthritis in her other; Diabetes in her father; Hyperlipidemia in her other; Hypertension in her mother; Mitral valve prolapse in her mother.    ROS:  Please see the history of present illness.   Otherwise, review of systems are positive for lost hearing in L ear.   All other systems are reviewed and negative.    PHYSICAL EXAM: VS:  There were no vitals taken for this visit. , BMI  There is no height or weight on file to calculate BMI. GENERAL:  Well appearing HEENT:  Pupils equal round and reactive, fundi not visualized, oral mucosa unremarkable NECK:  No jugular venous distention, waveform within normal limits, carotid upstroke brisk and symmetric, no bruits, no thyromegaly LUNGS:  Clear to auscultation bilaterally HEART:  RRR.  PMI not displaced or sustained,S1 and S2 within normal limits, no S3, no S4, no clicks, no rubs, no murmurs ABD:  Flat, positive bowel sounds normal in frequency in pitch, no bruits, no rebound, no guarding, no midline pulsatile mass, no hepatomegaly, no splenomegaly EXT:  2 plus pulses throughout, no edema, no cyanosis no clubbing.  L UE fistula SKIN:  No rashes no nodules NEURO:  Cranial nerves II through XII grossly intact, motor grossly intact throughout PSYCH:  Cognitively intact, oriented to person place and time  ] EKG:  EKG is ordered today. The ekg ordered today demonstrates sinus rhythm rate 70 bpm.  48 Hour Holter Monitor 10/08/16:  Quality: Fair.  Baseline artifact. Predominant rhythm: sinus rhythm Average heart rate: 64 bpm Max heart rate: 94 bpm Min heart rate: 50 bpm  Occasional PACs were noted that did not correspond with the fluttering reported by the patient.   Echo 01/04/15: Study Conclusions  - Left ventricle: The cavity size was normal. Wall thickness was   normal. Systolic function was vigorous. The estimated ejection   fraction was in the range of 65% to 70%. Features are consistent   with a pseudonormal left ventricular filling pattern, with   concomitant abnormal relaxation and increased filling pressure   (grade 2 diastolic dysfunction). - Mitral valve: There was mild regurgitation. - Pericardium, extracardiac: A trivial pericardial effusion was   identified.  Recent Labs: 05/12/2016: Pro B Natriuretic peptide (BNP) 423.0 05/18/2016: Hemoglobin 9.9; Platelets 358.0 09/24/2016: ALT 15; BUN 26;  Creatinine, Ser 7.54; Magnesium 2.4; Potassium 5.4; Sodium 140; TSH 2.020   09/02/16: Hemoglobin 11, hematocrit 33  08/12/16: Sodium 140, potassium 5.4, BUN 47, creatinine 11.0   Lipid Panel    Component Value Date/Time   CHOL 201 (H) 12/05/2015 1000   TRIG 86.0 12/05/2015 1000   HDL 58.10 12/05/2015 1000   CHOLHDL 3 12/05/2015 1000   VLDL 17.2 12/05/2015 1000   LDLCALC 125 (H) 12/05/2015 1000   LDLDIRECT 161.6 11/28/2012 1042      Wt Readings from Last 3 Encounters:  09/24/16 127.8 kg (281 lb 12.8 oz)  09/14/16 130.2 kg (287 lb)  07/25/16 126.7 kg (279 lb 6.4 oz)      ASSESSMENT AND PLAN:  # Palpitations:  # Bradycardia: # Dizziness: We will get a 48 hour Holter to better assess what The underlying rhythm is. She already had a CBC. We will check a CMP, magnesium, TSH, and free T4.  # Hypertension: BP today is above goal.  However, She has not yet taken her blood pressure medication today. It is well-controlled when she takes it.  Her blood pressure gets low with dialysis.  Continue amlodipine, irbesartan and nebivolol.  # Hyperlipidemia: LDL is above goal.  Will address at follow up.    Current medicines are reviewed at length with the patient today.  The patient does not have concerns regarding medicines.  The following changes have been made:  no change  Labs/ tests ordered today include:   No orders of the defined types were placed in this encounter.    Disposition:   FU with Ebonye Reade C. Oval Linsey, MD, [facility] in 1 month    This note was written with the assistance of speech recognition software.  Please excuse any transcriptional errors.  Signed, Zailyn Thoennes C. Oval Linsey, MD, [facility]  10/31/2016 10:35 PM     [facility]

## 2016-11-02 ENCOUNTER — Ambulatory Visit: Payer: 59 | Admitting: Cardiovascular Disease

## 2016-11-02 ENCOUNTER — Encounter: Payer: Self-pay | Admitting: *Deleted

## 2016-11-02 NOTE — Progress Notes (Signed)
Letter mailed

## 2016-11-18 ENCOUNTER — Other Ambulatory Visit: Payer: Self-pay | Admitting: Internal Medicine

## 2016-12-17 ENCOUNTER — Telehealth: Payer: Self-pay | Admitting: Internal Medicine

## 2016-12-17 NOTE — Telephone Encounter (Addendum)
Copied from [place] [phone]. Topic: Quick Communication - See Telephone Encounter >> Dec 17, 2016  9:01 AM Ivar Drape wrote: CRM for notification. See Telephone encounter for:  12/17/16. Patient expressed that about 10 days ago her pharmacist, the [facility] on [address]. called the office to approve a different medication that is compatible with Bystolic.  The patient could not remember the name of the medication.  She requested this because this medication is cheaper, but the pharmacist have not heard anything back from the doctor's office about the approval.  Please advise.

## 2016-12-17 NOTE — Telephone Encounter (Signed)
Reviewed medication list. This medication is prescribed by cardiology. This request should go to cardiology.  Left detailed message for patient stating same.

## 2016-12-17 NOTE — Telephone Encounter (Signed)
Forwarding to MD CMA to review. Per chart do not see anything regarding msg below...lmb

## 2017-01-18 ENCOUNTER — Ambulatory Visit
Admission: RE | Admit: 2017-01-18 | Discharge: 2017-01-18 | Disposition: A | Discharge status: Home / Self Care | Payer: 59 | Source: Ambulatory Visit | Attending: Nephrology | Admitting: Nephrology

## 2017-01-18 ENCOUNTER — Other Ambulatory Visit: Payer: Self-pay | Admitting: Nephrology

## 2017-01-18 DIAGNOSIS — R52 Pain, unspecified: Secondary | ICD-10-CM

## 2017-02-08 ENCOUNTER — Encounter (HOSPITAL_COMMUNITY): Payer: Self-pay

## 2017-02-17 ENCOUNTER — Ambulatory Visit (INDEPENDENT_AMBULATORY_CARE_PROVIDER_SITE_OTHER)
Admission: RE | Admit: 2017-02-17 | Discharge: 2017-02-17 | Disposition: A | Discharge status: Home / Self Care | Payer: 59 | Source: Ambulatory Visit | Attending: Internal Medicine | Admitting: Internal Medicine

## 2017-02-17 ENCOUNTER — Ambulatory Visit (INDEPENDENT_AMBULATORY_CARE_PROVIDER_SITE_OTHER): Payer: 59 | Admitting: Internal Medicine

## 2017-02-17 ENCOUNTER — Encounter: Payer: Self-pay | Admitting: Internal Medicine

## 2017-02-17 ENCOUNTER — Telehealth: Payer: Self-pay | Admitting: Internal Medicine

## 2017-02-17 ENCOUNTER — Telehealth: Payer: Self-pay

## 2017-02-17 VITALS — BP 130/70 | HR 79 | Temp 98.6°F | Resp 16 | Ht 65.0 in | Wt 285.0 lb

## 2017-02-17 DIAGNOSIS — J069 Acute upper respiratory infection, unspecified: Secondary | ICD-10-CM | POA: Diagnosis not present

## 2017-02-17 DIAGNOSIS — R059 Cough, unspecified: Secondary | ICD-10-CM

## 2017-02-17 DIAGNOSIS — B9789 Other viral agents as the cause of diseases classified elsewhere: Secondary | ICD-10-CM

## 2017-02-17 DIAGNOSIS — R05 Cough: Secondary | ICD-10-CM | POA: Diagnosis not present

## 2017-02-17 DIAGNOSIS — R6889 Other general symptoms and signs: Secondary | ICD-10-CM | POA: Diagnosis not present

## 2017-02-17 LAB — POC INFLUENZA A&B (BINAX/QUICKVUE)
Influenza A, POC: NEGATIVE
Influenza B, POC: NEGATIVE

## 2017-02-17 MED ORDER — PROMETHAZINE-DM 6.25-15 MG/5ML PO SYRP: 5.0000 mL | ORAL_SOLUTION | Freq: Four times a day (QID) | ORAL | 0 refills | Status: DC | PRN

## 2017-02-17 NOTE — Telephone Encounter (Signed)
Diane from [facility] called to give results of the chest xray for  [facility].   Impressions: 1. Tiny amount of free peritoneal air beneath the right hemidiaphragm. This is most likely related to the patient's peritoneal dialysis. Correlation with the presence or absence of abdominal symptoms suggestive of bowel perforation is necessary. 2. Stable mild bronchitic changes.

## 2017-02-17 NOTE — Telephone Encounter (Signed)
pts stated receive pneumo vaccine at dialysis

## 2017-02-17 NOTE — Progress Notes (Signed)
Subjective:  Patient ID: Brenda Davis, female    DOB: 1965-06-10  Age: 52 y.o. MRN: [id number]  CC: Cough   HPI Brenda Davis presents for a 3-day history of nonproductive cough with posttussive nausea and vomiting.  She also complains of sore throat and chills but she denies fever, shortness of breath, wheezing, or hemoptysis.  Outpatient Medications Prior to Visit  Medication Sig Dispense Refill  . amLODipine (NORVASC) 10 MG tablet Take 1 tablet (10 mg total) by mouth daily. 30 tablet 0  . calcium acetate (PHOSLO) 667 MG capsule Take 2 capsules (1,334 mg total) by mouth 3 (three) times daily with meals. 180 capsule 0  . irbesartan (AVAPRO) 300 MG tablet TAKE 1 TABLET BY MOUTH AT BEDTIME 90 tablet 0  . multivitamin (RENA-VIT) TABS tablet Take 1 tablet by mouth at bedtime. 30 tablet 0  . polyethylene glycol (MIRALAX / GLYCOLAX) packet Take 17 g by mouth daily as needed. 14 each 0  . thiamine (VITAMIN B-1) 100 MG tablet Take 1 tablet (100 mg total) by mouth daily. 90 tablet 3  . nebivolol (BYSTOLIC) 5 MG tablet Take 2 tablets (10 mg total) by mouth 2 (two) times daily. (Patient not taking: Reported on 02/17/2017)     No facility-administered medications prior to visit.     ROS Review of Systems  Constitutional: Positive for chills. Negative for diaphoresis, fatigue and fever.  HENT: Positive for sore throat. Negative for trouble swallowing.   Eyes: Negative.   Respiratory: Positive for cough. Negative for chest tightness, shortness of breath and wheezing.   Cardiovascular: Negative for chest pain, palpitations and leg swelling.  Gastrointestinal: Positive for nausea and vomiting. Negative for abdominal pain, constipation and diarrhea.  Endocrine: Negative.   Genitourinary: Negative.  Negative for difficulty urinating.  Musculoskeletal: Negative.  Negative for back pain, myalgias and neck pain.  Skin: Negative.  Negative for color change and rash.    Allergic/Immunologic: Negative.   Neurological: Negative.  Negative for dizziness and weakness.  Hematological: Negative for adenopathy. Does not bruise/bleed easily.  Psychiatric/Behavioral: Negative.     Objective:  BP 130/70 (BP Location: Right Arm, Patient Position: Sitting, Cuff Size: Large)   Pulse 79   Temp 98.6 F (37 C) (Oral)   Resp 16   Ht 5\' 5"  (1.651 m)   Wt 285 lb (129.3 kg)   SpO2 99%   BMI 47.43 kg/m   BP Readings from Last 3 Encounters:  02/17/17 130/70  09/24/16 (!) 150/94  09/14/16 132/84    Wt Readings from Last 3 Encounters:  02/17/17 285 lb (129.3 kg)  09/24/16 281 lb 12.8 oz (127.8 kg)  09/14/16 287 lb (130.2 kg)    Physical Exam  Constitutional: She is oriented to person, place, and time.  Non-toxic appearance. She does not have a sickly appearance. She does not appear ill. No distress.  HENT:  Mouth/Throat: Oropharynx is clear and moist. No oropharyngeal exudate.  Eyes: Conjunctivae are normal. Left eye exhibits no discharge. No scleral icterus.  Neck: Normal range of motion. Neck supple. No JVD present. No thyromegaly present.  Cardiovascular: Normal rate, regular rhythm and normal heart sounds. Exam reveals no gallop and no friction rub.  No murmur heard. Pulmonary/Chest: Effort normal and breath sounds normal. No respiratory distress. She has no wheezes. She has no rales.  Abdominal: Soft. Bowel sounds are normal. She exhibits no distension and no mass.  Musculoskeletal: Normal range of motion. She exhibits no edema or tenderness.  Lymphadenopathy:  She has no cervical adenopathy.  Neurological: She is alert and oriented to person, place, and time.  Skin: Skin is warm and dry. No rash noted. She is not diaphoretic. No erythema. No pallor.  Vitals reviewed.   Lab Results  Component Value Date   WBC 8.9 05/18/2016   HGB 9.9 (L) 05/18/2016   HCT 30.5 (L) 05/18/2016   PLT 358.0 05/18/2016   GLUCOSE 127 (H) 09/24/2016   CHOL 201 (H)  12/05/2015   TRIG 86.0 12/05/2015   HDL 58.10 12/05/2015   LDLDIRECT 161.6 11/28/2012   LDLCALC 125 (H) 12/05/2015   ALT 15 09/24/2016   AST 17 09/24/2016   NA 140 09/24/2016   K 5.4 (H) 09/24/2016   CL 94 (L) 09/24/2016   CREATININE 7.54 (H) 09/24/2016   BUN 26 (H) 09/24/2016   CO2 28 09/24/2016   TSH 2.020 09/24/2016   INR 1.00 05/06/2016   HGBA1C 7.0 (H) 05/18/2016   MICROALBUR 159.00 (H) 09/17/2009    Dg Abd 1 View  Result Date: 01/18/2017 CLINICAL DATA:  Peritoneal dialysis.  Abdominal pain. EXAM: ABDOMEN - 1 VIEW COMPARISON:  02/05/2015 CT abdomen/ pelvis. FINDINGS: Peritoneal dialysis catheter loops within and terminates within the deep pelvis near the midline. No evidence of catheter discontinuity. Surgical sutures and clips overlie the stomach. No disproportionately dilated small bowel loops. Mild colonic stool. No evidence of pneumatosis or pneumoperitoneum. No radiopaque nephrolithiasis. IMPRESSION: Peritoneal dialysis catheter loops within an terminates within the deep pelvis near the midline, with no evidence of catheter discontinuity. Nonobstructive bowel gas pattern. Electronically Signed   By: Ilona Sorrel M.D.   On: 01/18/2017 15:52   Dg Chest 2 View  Result Date: 02/17/2017 CLINICAL DATA:  Pharyngitis, cough and chest congestion for the past 3 days. Ex-smoker. EXAM: CHEST  2 VIEW COMPARISON:  05/12/2016.  Abdomen radiograph dated 01/18/2017. FINDINGS: Normal sized heart. Tiny amount of free peritoneal air beneath the right hemidiaphragm. A peritoneal dialysis catheter was demonstrated on 01/18/2017. No pneumomediastinum seen. Clear lungs with stable mild diffuse peribronchial thickening and accentuation of the interstitial markings. Mild thoracic spine degenerative changes. IMPRESSION: 1. Tiny amount of free peritoneal air beneath the right hemidiaphragm. This is most likely related to the patient's peritoneal dialysis. Correlation with the presence or absence of abdominal  symptoms suggestive of bowel perforation is necessary. 2. Stable mild bronchitic changes. These results will be called to the ordering clinician or representative by the Radiologist Assistant, and communication documented in the PACS or zVision Dashboard. Electronically Signed   By: Claudie Revering M.D.   On: 02/17/2017 15:01    Assessment & Plan:   Idabelle was seen today for cough.  Diagnoses and all orders for this visit:  Cough- Her chest x-ray is negative for infiltrate. -     DG Chest 2 View; Future  Viral URI with cough- Will treat the symptoms with Phenergan DM as needed.  Antibiotic therapy is not indicated. -     promethazine-dextromethorphan (PROMETHAZINE-DM) 6.25-15 MG/5ML syrup; Take 5 mLs by mouth 4 (four) times daily as needed for cough.  Flu-like symptoms- Screening for influenza is negative. -     POC Influenza A&B (Binax test)   I am having Brenda Davis start on promethazine-dextromethorphan. I am also having her maintain her amLODipine, calcium acetate, multivitamin, nebivolol, polyethylene glycol, thiamine, and irbesartan.  Meds ordered this encounter  Medications  . promethazine-dextromethorphan (PROMETHAZINE-DM) 6.25-15 MG/5ML syrup    Sig: Take 5 mLs by mouth 4 (four) times  daily as needed for cough.    Dispense:  118 mL    Refill:  0     Follow-up: Return in about 2 weeks (around 03/03/2017).  Scarlette Calico, MD

## 2017-02-17 NOTE — Patient Instructions (Signed)
Cough, Adult  Coughing is a reflex that clears your throat and your airways. Coughing helps to heal and protect your lungs. It is normal to cough occasionally, but a cough that happens with other symptoms or lasts a long time may be a sign of a condition that needs treatment. A cough may last only 2-3 weeks (acute), or it may last longer than 8 weeks (chronic).  What are the causes?  Coughing is commonly caused by:   Breathing in substances that irritate your lungs.   A viral or bacterial respiratory infection.   Allergies.   Asthma.   Postnasal drip.   Smoking.   Acid backing up from the stomach into the esophagus (gastroesophageal reflux).   Certain medicines.   Chronic lung problems, including COPD (or rarely, lung cancer).   Other medical conditions such as heart failure.    Follow these instructions at home:  Pay attention to any changes in your symptoms. Take these actions to help with your discomfort:   Take medicines only as told by your health care provider.  ? If you were prescribed an antibiotic medicine, take it as told by your health care provider. Do not stop taking the antibiotic even if you start to feel better.  ? Talk with your health care provider before you take a cough suppressant medicine.   Drink enough fluid to keep your urine clear or pale yellow.   If the air is dry, use a cold steam vaporizer or humidifier in your bedroom or your home to help loosen secretions.   Avoid anything that causes you to cough at work or at home.   If your cough is worse at night, try sleeping in a semi-upright position.   Avoid cigarette smoke. If you smoke, quit smoking. If you need help quitting, ask your health care provider.   Avoid caffeine.   Avoid alcohol.   Rest as needed.    Contact a health care provider if:   You have new symptoms.   You cough up pus.   Your cough does not get better after 2-3 weeks, or your cough gets worse.   You cannot control your cough with suppressant  medicines and you are losing sleep.   You develop pain that is getting worse or pain that is not controlled with pain medicines.   You have a fever.   You have unexplained weight loss.   You have night sweats.  Get help right away if:   You cough up blood.   You have difficulty breathing.   Your heartbeat is very fast.  This information is not intended to replace advice given to you by your health care provider. Make sure you discuss any questions you have with your health care provider.  Document Released: 07/04/2010 Document Revised: 06/13/2015 Document Reviewed: 03/14/2014  Elsevier Interactive Patient Education  2018 [employer]

## 2017-03-09 ENCOUNTER — Other Ambulatory Visit: Payer: Self-pay | Admitting: Internal Medicine

## 2017-05-21 ENCOUNTER — Emergency Department (HOSPITAL_COMMUNITY): Payer: 59

## 2017-05-21 ENCOUNTER — Emergency Department (HOSPITAL_COMMUNITY)
Admission: EM | Admit: 2017-05-21 | Discharge: 2017-05-21 | Disposition: A | Discharge status: Home / Self Care | Payer: 59 | Attending: Emergency Medicine | Admitting: Emergency Medicine

## 2017-05-21 ENCOUNTER — Other Ambulatory Visit: Payer: Self-pay

## 2017-05-21 ENCOUNTER — Encounter (HOSPITAL_COMMUNITY): Payer: Self-pay

## 2017-05-21 DIAGNOSIS — Z79899 Other long term (current) drug therapy: Secondary | ICD-10-CM | POA: Insufficient documentation

## 2017-05-21 DIAGNOSIS — N185 Chronic kidney disease, stage 5: Secondary | ICD-10-CM | POA: Diagnosis not present

## 2017-05-21 DIAGNOSIS — Z87891 Personal history of nicotine dependence: Secondary | ICD-10-CM | POA: Insufficient documentation

## 2017-05-21 DIAGNOSIS — I120 Hypertensive chronic kidney disease with stage 5 chronic kidney disease or end stage renal disease: Secondary | ICD-10-CM | POA: Diagnosis not present

## 2017-05-21 DIAGNOSIS — M25511 Pain in right shoulder: Secondary | ICD-10-CM | POA: Insufficient documentation

## 2017-05-21 DIAGNOSIS — J4550 Severe persistent asthma, uncomplicated: Secondary | ICD-10-CM | POA: Diagnosis not present

## 2017-05-21 DIAGNOSIS — E1122 Type 2 diabetes mellitus with diabetic chronic kidney disease: Secondary | ICD-10-CM | POA: Diagnosis not present

## 2017-05-21 LAB — CBC WITH DIFFERENTIAL/PLATELET
Basophils Absolute: 0 10*3/uL (ref 0.0–0.1)
Basophils Relative: 0 %
Eosinophils Absolute: 0.4 10*3/uL (ref 0.0–0.7)
Eosinophils Relative: 5 %
HCT: 32.5 % — ABNORMAL LOW (ref 36.0–46.0)
Hemoglobin: 10.6 g/dL — ABNORMAL LOW (ref 12.0–15.0)
Lymphocytes Relative: 29 %
Lymphs Abs: 2.8 10*3/uL (ref 0.7–4.0)
MCH: 33.7 pg (ref 26.0–34.0)
MCHC: 32.6 g/dL (ref 30.0–36.0)
MCV: 103.2 fL — ABNORMAL HIGH (ref 78.0–100.0)
Monocytes Absolute: 0.7 10*3/uL (ref 0.1–1.0)
Monocytes Relative: 7 %
Neutro Abs: 5.8 10*3/uL (ref 1.7–7.7)
Neutrophils Relative %: 59 %
Platelets: 405 10*3/uL — ABNORMAL HIGH (ref 150–400)
RBC: 3.15 MIL/uL — ABNORMAL LOW (ref 3.87–5.11)
RDW: 13.4 % (ref 11.5–15.5)
WBC: 9.7 10*3/uL (ref 4.0–10.5)

## 2017-05-21 LAB — BASIC METABOLIC PANEL
Anion gap: 16 — ABNORMAL HIGH (ref 5–15)
BUN: 69 mg/dL — ABNORMAL HIGH (ref 6–20)
CO2: 26 mmol/L (ref 22–32)
Calcium: 9.3 mg/dL (ref 8.9–10.3)
Chloride: 99 mmol/L — ABNORMAL LOW (ref 101–111)
Creatinine, Ser: 15.35 mg/dL — ABNORMAL HIGH (ref 0.44–1.00)
GFR calc Af Amer: 3 mL/min — ABNORMAL LOW (ref >60)
GFR calc non Af Amer: 2 mL/min — ABNORMAL LOW (ref >60)
Glucose, Bld: 141 mg/dL — ABNORMAL HIGH (ref 65–99)
Potassium: 4.9 mmol/L (ref 3.5–5.1)
Sodium: 141 mmol/L (ref 135–145)

## 2017-05-21 LAB — TROPONIN I: Troponin I: <0.03 ng/mL (ref <0.03)

## 2017-05-21 LAB — HEPATIC FUNCTION PANEL
ALT: 18 U/L (ref 14–54)
AST: 15 U/L (ref 15–41)
Albumin: 3.6 g/dL (ref 3.5–5.0)
Alkaline Phosphatase: 84 U/L (ref 38–126)
Bilirubin, Direct: <0.1 mg/dL — ABNORMAL LOW (ref 0.1–0.5)
Total Bilirubin: 0.4 mg/dL (ref 0.3–1.2)
Total Protein: 7.4 g/dL (ref 6.5–8.1)

## 2017-05-21 MED ORDER — CYCLOBENZAPRINE HCL 10 MG PO TABS: 5.0000 mg | ORAL_TABLET | Freq: Two times a day (BID) | ORAL | 0 refills | Status: DC | PRN

## 2017-05-21 NOTE — ED Notes (Signed)
To x-ray

## 2017-05-21 NOTE — ED Notes (Signed)
Waiting on xray results   Blanket given

## 2017-05-21 NOTE — ED Provider Notes (Signed)
Patient placed in Quick Look pathway, seen and evaluated   Chief Complaint: right upper chest pain.   HPI:   Brenda Davis is a 52 y.o. female who presents to the ED for right upper chest pain that started over a month ago and worsens when on dialysis. Patient reports that today when she went for dialysis the pain in the right side of her chest went up to her shoulder and was worse than usual. Patient reports usually the pain starts and goes on for a few days and then resolves. No the pain is to the point it keeps her awake at night.   ROS: M/S: right chest and shoulder pain  Physical Exam:  BP (!) 154/91 (BP Location: Right Arm)   Pulse 86   Temp 98.7 F (37.1 C) (Oral)   Resp 18   Ht 5' 5"  (1.651 m)   Wt 127.9 kg (282 lb)   SpO2 98%   BMI 46.93 kg/m    Gen: No distress  Neuro: Awake and Alert  Skin: Warm  Chest: tender with palpation to the right upper chest wall.  M/S: full passive range of motion without difficulty.   Focused Exam:    Initiation of care has begun. The patient has been counseled on the process, plan, and necessity for staying for the completion/evaluation, and the remainder of the medical screening examination    Ashley Murrain, NP 05/21/17 1530    Blanchie Dessert, MD 05/21/17 2055

## 2017-05-21 NOTE — ED Triage Notes (Signed)
Pt endorses right shoulder pain that has been going on for months when she has dialysis. Pt then has relief for 3-4 days then it comes back. VSS

## 2017-05-21 NOTE — ED Notes (Signed)
Debroah Baller NP advised this Rn to move pt to fast track.

## 2017-05-21 NOTE — ED Provider Notes (Signed)
[facility] EMERGENCY DEPARTMENT Provider Note   CSN: [id number] Arrival date & time: 05/21/17  1443     History   Chief Complaint Chief Complaint  Patient presents with  . Shoulder Pain    HPI Brenda Davis is a 52 y.o. female who presents emergency department chief complaint of right shoulder pain.  Patient has a past medical history of morbid obesity, hypertension, hyperlipidemia, end-stage renal disease on peritoneal dialysis which she does daily at home.  Patient states that for the past month she has had pain on and off in her right shoulder is sometimes radiates to the right lateral rib cage.  She states that time she cannot find a position of comfort and it feels almost like a stabbing.  At times it may also be catching and cause her to feel short of breath.  She denies right upper quadrant epigastric pain, nausea.  The pain is not worsened by anything she can think of.  She has relief when she sits up straight.  She denies cough, shortness of breath, hemoptysis.  The pain is not associated with her dialysis.  She was sent here by her kidney doctor because he thought it might be her chronic gallbladder.  HPI  Past Medical History:  Diagnosis Date  . Anemia   . Chronic kidney disease    STAGE 3 CHRONIC KIDNEY DISEASE SECONDARY TO DIABETIC GLOMERULOSCLEROSIS AND UNCONTROLLED HYPERTENSION - PER OFFICE NOTES DR[facility]  Marland Kitchen ESRD on dialysis [facility])    "MWF; [address] (05/06/2016)  . Gout   . HCAP (healthcare-associated pneumonia) 05/06/2016  . History of blood transfusion    "related to surgery"  . Hyperlipidemia   . Hypertension   . Morbid obesity ([place])   . Pain    LEFT SHOULDER PAIN - WAS SEEN AT AN URGENT CARE - GIVEN SLING FOR COMFORT AND TOLD ROM AS TOLERATED.  Marland Kitchen Palpitations 09/24/2016  . Type II diabetes mellitus ([place])    "gastric sleeve OR corrected this" (05/06/2016)    Patient Active Problem List   Diagnosis  Date Noted  . Palpitations 09/24/2016  . Trigger finger, right index finger 09/14/2016  . Trigger finger, left middle finger 09/14/2016  . Allergic rhinitis 06/27/2016  . Acute sinus infection 06/23/2016  . Vertigo 06/23/2016  . Tinnitus of right ear 06/23/2016  . Acute dysfunction of left eustachian tube 06/23/2016  . Severe persistent asthma with exacerbation 05/12/2016  . Dyspnea   . Anemia due to chronic kidney disease 05/06/2016  . Routine general medical examination at a health care facility 12/05/2015  . Cervical cancer screening 12/05/2015  . Visit for screening mammogram 01/23/2015  . Cough 01/23/2015  . Viral URI with cough 01/23/2015  . Sinus pause 01/04/2015  . Hyperglycemia 12/31/2014  . Gout due to renal impairment 09/13/2014  . Thiamine deficiency 12/02/2012  . Meralgia paraesthetica 11/28/2012  . S/P laparoscopic sleeve gastrectomy 11/28/2012  . B12 deficiency anemia 11/28/2012  . Hyperlipidemia with target LDL less than 100 04/09/2008  . CKD (chronic kidney disease) stage V requiring chronic dialysis (MWF) 02/08/2008  . Proteinuria 01/16/2008  . HYPERTENSION, BENIGN ESSENTIAL 01/20/1999    Past Surgical History:  Procedure Laterality Date  . AV FISTULA PLACEMENT Left 01/03/2015   Procedure: BRACHIAL CEPHALIC ARTERIOVENOUS  FISTULA CREATION LEFT ARM;  Surgeon: Angelia Mould, MD;  Location: [facility];  Service: Vascular;  Laterality: Left;  . CARPAL TUNNEL RELEASE Right   . [place],  1993  . ESOPHAGOGASTRODUODENOSCOPY N/A 09/04/2012   Procedure: ESOPHAGOGASTRODUODENOSCOPY (EGD);  Surgeon: Shann Medal, MD;  Location: [facility];  Service: General;  Laterality: N/A;  PF  . ESOPHAGOGASTRODUODENOSCOPY (EGD) WITH ESOPHAGEAL DILATION N/A 09/29/2012   Procedure: ESOPHAGOGASTRODUODENOSCOPY (EGD) WITH ESOPHAGEAL DILATION;  Surgeon: Milus Banister, MD;  Location: [facility];  Service: Endoscopy;  Laterality: N/A;  . INSERTION OF DIALYSIS CATHETER  N/A 01/03/2015   Procedure: INSERTION OF DIALYSIS CATHETER RIGHT INTERNAL JUGULAR;  Surgeon: Angelia Mould, MD;  Location: [facility];  Service: Vascular;  Laterality: N/A;  . LAPAROSCOPIC GASTRIC SLEEVE RESECTION N/A 07/19/2012   Procedure: LAPAROSCOPIC SLEEVE GASTRECTOMY with EGD;  Surgeon: Madilyn Hook, DO;  Location: [facility];  Service: General;  Laterality: N/A;  laparoscopic sleeve gastrectomy with EGD  . PERIPHERAL VASCULAR CATHETERIZATION Left 05/23/2015   Procedure: Nolon Stalls;  Surgeon: Conrad [place], MD;  Location: [facility];  Service: Cardiovascular;  Laterality: Left;  upper aRM  . PERIPHERAL VASCULAR CATHETERIZATION Left 05/23/2015   Procedure: Peripheral Vascular Balloon Angioplasty;  Surgeon: Conrad [place], MD;  Location: [facility];  Service: Cardiovascular;  Laterality: Left;  av fistula  . TUBAL LIGATION  1993  . UPPER GI ENDOSCOPY N/A 07/19/2012   Procedure: UPPER GI ENDOSCOPY;  Surgeon: Madilyn Hook, DO;  Location: [facility];  Service: General;  Laterality: N/A;     OB History   None      Home Medications    Prior to Admission medications   Medication Sig Start Date End Date Taking? Authorizing Provider  acetaminophen (TYLENOL) 500 MG tablet Take 1,000 mg by mouth every 6 (six) hours as needed (for pain).   Yes [provider]  amLODipine (NORVASC) 10 MG tablet Take 1 tablet (10 mg total) by mouth daily. 01/07/15  Yes Eugenie Filler, MD  Aspirin-Acetaminophen-Caffeine (GOODY HEADACHE PO) Take 1 packet by mouth daily as needed (for pain).   Yes [provider]  AURYXIA 1 GM 210 MG(Fe) tablet TAKE 1 TABLET BY MOUTH THREE TIMES A DAY WITH MEALS.   SWALLOW WHOLE, DO NOT CHEW OR CRUSH MEDICATION 04/07/17  Yes [provider]  calcium acetate (PHOSLO) 667 MG capsule Take 2 capsules (1,334 mg total) by mouth 3 (three) times daily with meals. 01/07/15  Yes Eugenie Filler, MD  gentamicin cream (GARAMYCIN) 0.1 % APPLY TO EXIT SITE DAILY  AS DIRECTED 04/26/17  Yes [provider]  heparin 1000 UNIT/ML injection ADMINISTER AS DIRECTED 04/26/17  Yes [provider]  irbesartan (AVAPRO) 300 MG tablet TAKE 1 TABLET BY MOUTH EVERYDAY AT BEDTIME 03/10/17  Yes [name], MD  Multiple Vitamins-Minerals (PRORENAL + D) TABS Take 1 tablet by mouth daily. 04/01/17  Yes [provider]  polyethylene glycol (MIRALAX / GLYCOLAX) packet Take 17 g by mouth daily as needed. Patient taking differently: Take 17 g by mouth daily as needed for mild constipation.  05/09/16  Yes Eugenie Filler, MD  SENSIPAR 30 MG tablet Take 30 mg by mouth every Monday, Wednesday, and Friday.  04/07/17  Yes [provider]  thiamine (VITAMIN B-1) 100 MG tablet Take 1 tablet (100 mg total) by mouth daily. 05/17/16  Yes [name], MD  cyclobenzaprine (FLEXERIL) 10 MG tablet Take 0.5-1 tablets (5-10 mg total) by mouth 2 (two) times daily as needed for muscle spasms. 05/21/17   Margarita Mail, PA-C  multivitamin (RENA-VIT) TABS tablet Take 1 tablet by mouth at bedtime. Patient not taking: Reported  on 05/21/2017 01/07/15   Eugenie Filler, MD  nebivolol (BYSTOLIC) 5 MG tablet Take 2 tablets (10 mg total) by mouth 2 (two) times daily. Patient not taking: Reported on 05/21/2017 05/09/16   Eugenie Filler, MD  promethazine-dextromethorphan (PROMETHAZINE-DM) 6.25-15 MG/5ML syrup Take 5 mLs by mouth 4 (four) times daily as needed for cough. Patient not taking: Reported on 05/21/2017 02/17/17   [name], MD    Family History Family History  Problem Relation Age of Onset  . Hypertension Mother   . Mitral valve prolapse Mother   . Diabetes Father   . Arthritis Other   . Hyperlipidemia Other   . Cancer Neg Hx   . Heart disease Neg Hx   . Kidney disease Neg Hx   . Stroke Neg Hx     Social History Social History   Tobacco Use  . Smoking status: Former Smoker    Packs/day: 1.00    Years: 16.00    Pack years: 16.00     Types: Cigarettes    Last attempt to quit: 2009    Years since quitting: 10.3  . Smokeless tobacco: Never Used  Substance Use Topics  . Alcohol use: Never    Frequency: Never  . Drug use: No     Allergies   Clonidine derivatives and Welchol [colesevelam hcl]   Review of Systems Review of Systems Ten systems reviewed and are negative for acute change, except as noted in the HPI.    Physical Exam Updated Vital Signs BP (!) 146/93 (BP Location: Right Arm)   Pulse 76   Temp 98.7 F (37.1 C) (Oral)   Resp 16   Ht 5\' 5"  (1.651 m)   Wt 127.9 kg (282 lb)   SpO2 97%   BMI 46.93 kg/m   Physical Exam  Constitutional: She is oriented to person, place, and time. She appears well-developed and well-nourished. No distress.  HENT:  Head: Normocephalic and atraumatic.  Eyes: Conjunctivae are normal. No scleral icterus.  Neck: Normal range of motion.  Cardiovascular: Normal rate, regular rhythm and normal heart sounds. Exam reveals no gallop and no friction rub.  No murmur heard. Pulmonary/Chest: Effort normal and breath sounds normal. No respiratory distress.  Abdominal: Soft. Bowel sounds are normal. She exhibits no distension and no mass. There is no tenderness. There is no guarding.  Negative Murphy sign  Musculoskeletal:  Patient with exquisite tenderness in the musculature at the superior angle of the right scapula, palpation of this region reproduces her pain.  She also has tenderness along the lateral wall of the rib cage at the insertion of the serratus anterior.  This also reproduces her pain.  Movement of the shoulder does not cause pain.  Neurological: She is alert and oriented to person, place, and time.  Skin: Skin is warm and dry. She is not diaphoretic.  Psychiatric: Her behavior is normal.  Nursing note and vitals reviewed.    ED Treatments / Results  Labs (all labs ordered are listed, but only abnormal results are displayed) Labs Reviewed  CBC WITH  DIFFERENTIAL/PLATELET - Abnormal; Notable for the following components:      Result Value   RBC 3.15 (*)    Hemoglobin 10.6 (*)    HCT 32.5 (*)    MCV 103.2 (*)    Platelets 405 (*)    All other components within normal limits  BASIC METABOLIC PANEL - Abnormal; Notable for the following components:   Chloride 99 (*)  Glucose, Bld 141 (*)    BUN 69 (*)    Creatinine, Ser 15.35 (*)    GFR calc non Af Amer 2 (*)    GFR calc Af Amer 3 (*)    Anion gap 16 (*)    All other components within normal limits  HEPATIC FUNCTION PANEL - Abnormal; Notable for the following components:   Bilirubin, Direct <0.1 (*)    All other components within normal limits  TROPONIN I    EKG None  Radiology Dg Chest 2 View  Result Date: 05/21/2017 CLINICAL DATA:  RIGHT chest pain for 2 months EXAM: CHEST - 2 VIEW COMPARISON:  02/18/2007 FINDINGS: Normal mediastinum and cardiac silhouette. Normal pulmonary vasculature. No evidence of effusion, infiltrate, or pneumothorax. No acute bony abnormality. Degenerative osteophytosis of the spine. IMPRESSION: No acute cardiopulmonary process. Electronically Signed   By: Suzy Bouchard M.D.   On: 05/21/2017 16:10    Procedures Procedures (including critical care time)  Medications Ordered in ED Medications - No data to display   Initial Impression / Assessment and Plan / ED Course  I have reviewed the triage vital signs and the nursing notes.  Pertinent labs & imaging results that were available during my care of the patient were reviewed by me and considered in my medical decision making (see chart for details).     Patient lab values show show no evidence of hepatic issue.  I doubt gallbladder is the cause of her shoulder pain.  Her pain is also reproducible with palpation.  Patient is given Flexeril at discharge.  She may follow-up with her PCP and have advised for her to obtain referral for evaluation treatment of right shoulder pain with physical  therapy.  I have no concern for other cause of her pain such as ACS.  She appears appropriate for discharge.  Final Clinical Impressions(s) / ED Diagnoses   Final diagnoses:  Acute pain of right shoulder    ED Discharge Orders        Ordered    cyclobenzaprine (FLEXERIL) 10 MG tablet  2 times daily PRN     05/21/17 1852       Margarita Mail, PA-C 05/21/17 1856    Virgel Manifold, MD 05/21/17 1942

## 2017-05-21 NOTE — Discharge Instructions (Signed)
Contact a health care provider if: °Your pain gets worse. °Your pain is not relieved with medicines. °New pain develops in your arm, hand, or fingers. °Get help right away if: °Your arm, hand, or fingers: °Tingle. °Become numb. °Become swollen. °Become painful. °Turn white or blue. °

## 2017-05-24 LAB — HEMOGLOBIN A1C: Hemoglobin A1C: 5.8

## 2017-06-16 ENCOUNTER — Other Ambulatory Visit: Payer: Self-pay | Admitting: Internal Medicine

## 2017-06-17 ENCOUNTER — Other Ambulatory Visit: Payer: Self-pay | Admitting: Internal Medicine

## 2017-06-21 ENCOUNTER — Encounter: Payer: Self-pay | Admitting: Internal Medicine

## 2017-06-21 ENCOUNTER — Ambulatory Visit (INDEPENDENT_AMBULATORY_CARE_PROVIDER_SITE_OTHER): Payer: 59 | Admitting: Internal Medicine

## 2017-06-21 VITALS — BP 120/70 | HR 87 | Temp 98.3°F | Resp 16 | Ht 65.0 in | Wt 302.8 lb

## 2017-06-21 DIAGNOSIS — D518 Other vitamin B12 deficiency anemias: Secondary | ICD-10-CM

## 2017-06-21 DIAGNOSIS — Z1231 Encounter for screening mammogram for malignant neoplasm of breast: Secondary | ICD-10-CM

## 2017-06-21 DIAGNOSIS — I5032 Chronic diastolic (congestive) heart failure: Secondary | ICD-10-CM

## 2017-06-21 DIAGNOSIS — E519 Thiamine deficiency, unspecified: Secondary | ICD-10-CM | POA: Diagnosis not present

## 2017-06-21 DIAGNOSIS — Z1212 Encounter for screening for malignant neoplasm of rectum: Secondary | ICD-10-CM

## 2017-06-21 DIAGNOSIS — I10 Essential (primary) hypertension: Secondary | ICD-10-CM

## 2017-06-21 DIAGNOSIS — R739 Hyperglycemia, unspecified: Secondary | ICD-10-CM

## 2017-06-21 DIAGNOSIS — Z9884 Bariatric surgery status: Secondary | ICD-10-CM

## 2017-06-21 DIAGNOSIS — Z1211 Encounter for screening for malignant neoplasm of colon: Secondary | ICD-10-CM

## 2017-06-21 DIAGNOSIS — Z124 Encounter for screening for malignant neoplasm of cervix: Secondary | ICD-10-CM

## 2017-06-21 MED ORDER — CYANOCOBALAMIN 1000 MCG/ML IJ SOLN
1000.0000 ug | Freq: Once | INTRAMUSCULAR | Status: AC
  Administered 2017-06-21: 1000 ug via INTRAMUSCULAR

## 2017-06-21 MED ORDER — VITAMIN B-1 100 MG PO TABS: 100.0000 mg | ORAL_TABLET | Freq: Every day | ORAL | 1 refills | Status: DC

## 2017-06-21 MED ORDER — FOLIC ACID 1 MG PO TABS: 1.0000 mg | ORAL_TABLET | Freq: Every day | ORAL | 1 refills | Status: DC

## 2017-06-21 NOTE — Patient Instructions (Signed)

## 2017-06-21 NOTE — Progress Notes (Signed)
Subjective:  Patient ID: Brenda Davis, female    DOB: Jan 06, 1966  Age: 52 y.o. MRN: [id number]  CC: Hypertension and Anemia   HPI Brenda Davis presents for f/up - She complains of a 3-week history of painless edema around her ankles and feet.  She has also had a few episodes of low blood pressure with dizziness and lightheadedness.  She had recently gained weight so her nephrologist has increased the dialysate in her peritoneal dialysis.  She has had some improvement with that.  Outpatient Medications Prior to Visit  Medication Sig Dispense Refill  . acetaminophen (TYLENOL) 500 MG tablet Take 1,000 mg by mouth every 6 (six) hours as needed (for pain).    Lorin Picket 1 GM 210 MG(Fe) tablet TAKE 1 TABLET BY MOUTH THREE TIMES A DAY WITH MEALS.   SWALLOW WHOLE, DO NOT CHEW OR CRUSH MEDICATION  3  . calcium acetate (PHOSLO) 667 MG capsule Take 2 capsules (1,334 mg total) by mouth 3 (three) times daily with meals. 180 capsule 0  . cyclobenzaprine (FLEXERIL) 10 MG tablet Take 0.5-1 tablets (5-10 mg total) by mouth 2 (two) times daily as needed for muscle spasms. 20 tablet 0  . gentamicin cream (GARAMYCIN) 0.1 % APPLY TO EXIT SITE DAILY AS DIRECTED  4  . heparin 1000 UNIT/ML injection ADMINISTER AS DIRECTED  99  . irbesartan (AVAPRO) 300 MG tablet TAKE 1 TABLET BY MOUTH EVERYDAY AT BEDTIME 90 tablet 0  . Multiple Vitamins-Minerals (PRORENAL + D) TABS Take 1 tablet by mouth daily.  4  . multivitamin (RENA-VIT) TABS tablet Take 1 tablet by mouth at bedtime. 30 tablet 0  . polyethylene glycol (MIRALAX / GLYCOLAX) packet Take 17 g by mouth daily as needed. (Patient taking differently: Take 17 g by mouth daily as needed for mild constipation. ) 14 each 0  . SENSIPAR 30 MG tablet Take 30 mg by mouth every Monday, Wednesday, and Friday.   6  . amLODipine (NORVASC) 10 MG tablet TAKE 1 TABLET BY MOUTH EVERY DAY 90 tablet 2  . nebivolol (BYSTOLIC) 5 MG tablet Take 2 tablets (10 mg  total) by mouth 2 (two) times daily.    Marland Kitchen thiamine (VITAMIN B-1) 100 MG tablet Take 1 tablet (100 mg total) by mouth daily. 90 tablet 3  . Aspirin-Acetaminophen-Caffeine (GOODY HEADACHE PO) Take 1 packet by mouth daily as needed (for pain).    . promethazine-dextromethorphan (PROMETHAZINE-DM) 6.25-15 MG/5ML syrup Take 5 mLs by mouth 4 (four) times daily as needed for cough. (Patient not taking: Reported on 05/21/2017) 118 mL 0   No facility-administered medications prior to visit.     ROS Review of Systems  Constitutional: Positive for unexpected weight change. Negative for appetite change, diaphoresis and fatigue.  HENT: Negative.  Negative for trouble swallowing.   Eyes: Negative for visual disturbance.  Respiratory: Negative for cough, chest tightness, shortness of breath and wheezing.   Cardiovascular: Positive for leg swelling. Negative for chest pain and palpitations.  Gastrointestinal: Negative for abdominal pain, constipation, diarrhea, nausea and vomiting.  Endocrine: Negative.   Genitourinary: Negative.  Negative for difficulty urinating.  Musculoskeletal: Negative.  Negative for arthralgias and neck stiffness.  Skin: Negative for color change, pallor and rash.  Neurological: Positive for dizziness and light-headedness. Negative for weakness, numbness and headaches.  Hematological: Negative for adenopathy. Does not bruise/bleed easily.  Psychiatric/Behavioral: Negative.     Objective:  BP 120/70 (BP Location: Left Arm, Patient Position: Sitting, Cuff Size: Large)   Pulse  87   Temp 98.3 F (36.8 C) (Oral)   Resp 16   Ht 5\' 5"  (1.651 m)   Wt (!) 302 lb 12 oz (137.3 kg)   SpO2 99%   BMI 50.38 kg/m   BP Readings from Last 3 Encounters:  06/21/17 120/70  05/21/17 (!) 146/93  02/17/17 130/70    Wt Readings from Last 3 Encounters:  06/21/17 (!) 302 lb 12 oz (137.3 kg)  05/21/17 282 lb (127.9 kg)  02/17/17 285 lb (129.3 kg)    Physical Exam  Constitutional: She is  oriented to person, place, and time. No distress.  HENT:  Mouth/Throat: Oropharynx is clear and moist. No oropharyngeal exudate.  Eyes: Conjunctivae are normal. No scleral icterus.  Neck: Normal range of motion. Neck supple. No JVD present. No thyromegaly present.  Cardiovascular: Normal rate and regular rhythm. Exam reveals no gallop and no friction rub.  Murmur heard.  Systolic murmur is present with a grade of 2/6.  No diastolic murmur is present. Pulmonary/Chest: Effort normal and breath sounds normal. No respiratory distress. She has no wheezes. She has no rales.  Abdominal: Soft. Normal appearance and bowel sounds are normal. She exhibits no mass. There is no hepatosplenomegaly. There is no tenderness.  Musculoskeletal: Normal range of motion. She exhibits edema. She exhibits no tenderness or deformity.  There is 2+, mildly pitting edema on the dorsum of both feet and around both ankles.  Lymphadenopathy:    She has no cervical adenopathy.  Neurological: She is alert and oriented to person, place, and time.  Skin: Skin is warm and dry. She is not diaphoretic.  Vitals reviewed.   Lab Results  Component Value Date   WBC 9.7 05/21/2017   HGB 10.6 (L) 05/21/2017   HCT 32.5 (L) 05/21/2017   PLT 405 (H) 05/21/2017   GLUCOSE 141 (H) 05/21/2017   CHOL 201 (H) 12/05/2015   TRIG 86.0 12/05/2015   HDL 58.10 12/05/2015   LDLDIRECT 161.6 11/28/2012   LDLCALC 125 (H) 12/05/2015   ALT 18 05/21/2017   AST 15 05/21/2017   NA 141 05/21/2017   K 4.9 05/21/2017   CL 99 (L) 05/21/2017   CREATININE 15.35 (H) 05/21/2017   BUN 69 (H) 05/21/2017   CO2 26 05/21/2017   TSH 2.020 09/24/2016   INR 1.00 05/06/2016   HGBA1C 5.8 05/24/2017   MICROALBUR 159.00 (H) 09/17/2009    Dg Chest 2 View  Result Date: 05/21/2017 CLINICAL DATA:  RIGHT chest pain for 2 months EXAM: CHEST - 2 VIEW COMPARISON:  02/18/2007 FINDINGS: Normal mediastinum and cardiac silhouette. Normal pulmonary vasculature. No  evidence of effusion, infiltrate, or pneumothorax. No acute bony abnormality. Degenerative osteophytosis of the spine. IMPRESSION: No acute cardiopulmonary process. Electronically Signed   By: Suzy Bouchard M.D.   On: 05/21/2017 16:10    Assessment & Plan:   Katalaya was seen today for hypertension and anemia.  Diagnoses and all orders for this visit:  HYPERTENSION, BENIGN ESSENTIAL- Her blood pressure is slightly low and she is symptomatic.  She also has peripheral edema which I think is a side effect of the calcium channel blocker.  I have asked her to stop taking amlodipine and Bystolic.  Cervical cancer screening -     Ambulatory referral to Gynecology  Visit for screening mammogram -     MM DIGITAL SCREENING BILATERAL; Future  Hyperglycemia- Her recent A1c shows that her blood sugars have been well controlled.  S/P laparoscopic sleeve gastrectomy  Thiamine deficiency -  thiamine (VITAMIN B-1) 100 MG tablet; Take 1 tablet (100 mg total) by mouth daily.  Colon cancer screening -     Cologuard  Screening for malignant neoplasm of the rectum -     Cologuard  Other vitamin B12 deficiency anemia -     cyanocobalamin ((VITAMIN B-12)) injection 5,643 mcg -     folic acid (FOLVITE) 1 MG tablet; Take 1 tablet (1 mg total) by mouth daily.  Chronic heart failure with preserved ejection fraction [facility])- She will control the excess fluid through her peritoneal dialysis.   I have discontinued [place] Lore. Goggans-Brockman's nebivolol, promethazine-dextromethorphan, Aspirin-Acetaminophen-Caffeine (GOODY HEADACHE PO), and amLODipine. I am also having her start on folic acid. Additionally, I am having her maintain her calcium acetate, multivitamin, polyethylene glycol, heparin, SENSIPAR, AURYXIA, PRORENAL + D, gentamicin cream, acetaminophen, cyclobenzaprine, irbesartan, and thiamine. We administered cyanocobalamin.  Meds ordered this encounter  Medications  . thiamine (VITAMIN B-1) 100  MG tablet    Sig: Take 1 tablet (100 mg total) by mouth daily.    Dispense:  90 tablet    Refill:  1  . cyanocobalamin ((VITAMIN B-12)) injection 1,000 mcg  . folic acid (FOLVITE) 1 MG tablet    Sig: Take 1 tablet (1 mg total) by mouth daily.    Dispense:  90 tablet    Refill:  1     Follow-up: Return in about 3 months (around 09/21/2017).  Scarlette Calico, MD

## 2017-06-22 ENCOUNTER — Telehealth: Payer: Self-pay

## 2017-06-22 DIAGNOSIS — I5032 Chronic diastolic (congestive) heart failure: Secondary | ICD-10-CM | POA: Insufficient documentation

## 2017-06-22 NOTE — Telephone Encounter (Signed)
Order [id number]

## 2017-07-15 ENCOUNTER — Encounter: Payer: Self-pay | Admitting: Internal Medicine

## 2017-07-15 ENCOUNTER — Ambulatory Visit (INDEPENDENT_AMBULATORY_CARE_PROVIDER_SITE_OTHER): Payer: 59 | Admitting: Internal Medicine

## 2017-07-15 VITALS — BP 154/100 | HR 60 | Temp 98.5°F | Resp 16 | Wt 298.0 lb

## 2017-07-15 DIAGNOSIS — K047 Periapical abscess without sinus: Secondary | ICD-10-CM | POA: Diagnosis not present

## 2017-07-15 MED ORDER — CLINDAMYCIN HCL 300 MG PO CAPS: 300.0000 mg | ORAL_CAPSULE | Freq: Three times a day (TID) | ORAL | 0 refills | Status: DC

## 2017-07-15 NOTE — Assessment & Plan Note (Signed)
Right lower tooth infection On home PD, so will prescribed clindamycin 300 mg Q 8 x 10 days Tylenol prn for pain Will see dentist on monday

## 2017-07-15 NOTE — Progress Notes (Signed)
Subjective:    Patient ID: Brenda Davis, female    DOB: [birth year], 52 y.o.   MRN: [id number]  HPI The patient is here for an acute visit.  Tooth pain:  She started having right lower tooth pain 5 days ago.  It is the right lower back tooth.  She has a bad taste in her mouth and figures she has an infection.  She denies fever or chills.  She has not been able to eat much and did not eat yesterday. She last saw a dentist in 8-9 years.  She has had several teeth pulled in past.  She is taking tylenol, but the pain has been severe and did take one goody powder even though she knows she should not due to her kidney disease.  She is on home PD.     She has an appt with dental works on Monday but can not be seen sooner.     Medications and allergies reviewed with patient and updated if appropriate.  Patient Active Problem List   Diagnosis Date Noted  . Chronic heart failure with preserved ejection fraction ([place]) 06/22/2017  . Allergic rhinitis 06/27/2016  . Severe persistent asthma with exacerbation 05/12/2016  . Anemia due to chronic kidney disease 05/06/2016  . Routine general medical examination at a health care facility 12/05/2015  . Cervical cancer screening 12/05/2015  . Visit for screening mammogram 01/23/2015  . Sinus pause 01/04/2015  . Hyperglycemia 12/31/2014  . Gout due to renal impairment 09/13/2014  . Thiamine deficiency 12/02/2012  . Meralgia paraesthetica 11/28/2012  . S/P laparoscopic sleeve gastrectomy 11/28/2012  . B12 deficiency anemia 11/28/2012  . Hyperlipidemia with target LDL less than 100 04/09/2008  . CKD (chronic kidney disease) stage V requiring chronic dialysis (MWF) 02/08/2008  . HYPERTENSION, BENIGN ESSENTIAL 01/20/1999    Current Outpatient Medications on File Prior to Visit  Medication Sig Dispense Refill  . acetaminophen (TYLENOL) 500 MG tablet Take 1,000 mg by mouth every 6 (six) hours as needed (for pain).    Lorin Picket 1 GM 210  MG(Fe) tablet TAKE 1 TABLET BY MOUTH THREE TIMES A DAY WITH MEALS.   SWALLOW WHOLE, DO NOT CHEW OR CRUSH MEDICATION  3  . calcium acetate (PHOSLO) 667 MG capsule Take 2 capsules (1,334 mg total) by mouth 3 (three) times daily with meals. 180 capsule 0  . carvedilol (COREG) 6.25 MG tablet Take 6.25 mg by mouth 2 (two) times daily with a meal.  12  . cyclobenzaprine (FLEXERIL) 10 MG tablet Take 0.5-1 tablets (5-10 mg total) by mouth 2 (two) times daily as needed for muscle spasms. 20 tablet 0  . folic acid (FOLVITE) 1 MG tablet Take 1 tablet (1 mg total) by mouth daily. 90 tablet 1  . gentamicin cream (GARAMYCIN) 0.1 % APPLY TO EXIT SITE DAILY AS DIRECTED  4  . heparin 1000 UNIT/ML injection ADMINISTER AS DIRECTED  99  . irbesartan (AVAPRO) 300 MG tablet TAKE 1 TABLET BY MOUTH EVERYDAY AT BEDTIME 90 tablet 0  . Multiple Vitamins-Minerals (PRORENAL + D) TABS Take 1 tablet by mouth daily.  4  . multivitamin (RENA-VIT) TABS tablet Take 1 tablet by mouth at bedtime. 30 tablet 0  . polyethylene glycol (MIRALAX / GLYCOLAX) packet Take 17 g by mouth daily as needed. (Patient taking differently: Take 17 g by mouth daily as needed for mild constipation. ) 14 each 0  . SENSIPAR 30 MG tablet Take 30 mg by mouth every Monday, Wednesday,  and Friday.   6  . thiamine (VITAMIN B-1) 100 MG tablet Take 1 tablet (100 mg total) by mouth daily. 90 tablet 1   No current facility-administered medications on file prior to visit.     Past Medical History:  Diagnosis Date  . Anemia   . Chronic kidney disease    STAGE 3 CHRONIC KIDNEY DISEASE SECONDARY TO DIABETIC GLOMERULOSCLEROSIS AND UNCONTROLLED HYPERTENSION - PER OFFICE NOTES DR. Florene [facility].  Marland Kitchen ESRD on dialysis [facility])    "MWF; [address] (05/06/2016)  . Gout   . HCAP (healthcare-associated pneumonia) 05/06/2016  . History of blood transfusion    "related to surgery"  . Hyperlipidemia   . Hypertension   . Morbid obesity ([place])   . Pain     LEFT SHOULDER PAIN - WAS SEEN AT AN URGENT CARE - GIVEN SLING FOR COMFORT AND TOLD ROM AS TOLERATED.  Marland Kitchen Palpitations 09/24/2016  . Type II diabetes mellitus ([place])    "gastric sleeve OR corrected this" (05/06/2016)    Past Surgical History:  Procedure Laterality Date  . AV FISTULA PLACEMENT Left 01/03/2015   Procedure: BRACHIAL CEPHALIC ARTERIOVENOUS  FISTULA CREATION LEFT ARM;  Surgeon: Angelia Mould, MD;  Location: [place];  Service: Vascular;  Laterality: Left;  . CARPAL TUNNEL RELEASE Right   . [place]  . ESOPHAGOGASTRODUODENOSCOPY N/A 09/04/2012   Procedure: ESOPHAGOGASTRODUODENOSCOPY (EGD);  Surgeon: Shann Medal, MD;  Location: [facility];  Service: General;  Laterality: N/A;  PF  . ESOPHAGOGASTRODUODENOSCOPY (EGD) WITH ESOPHAGEAL DILATION N/A 09/29/2012   Procedure: ESOPHAGOGASTRODUODENOSCOPY (EGD) WITH ESOPHAGEAL DILATION;  Surgeon: Milus Banister, MD;  Location: [facility];  Service: Endoscopy;  Laterality: N/A;  . INSERTION OF DIALYSIS CATHETER N/A 01/03/2015   Procedure: INSERTION OF DIALYSIS CATHETER RIGHT INTERNAL JUGULAR;  Surgeon: Angelia Mould, MD;  Location: [place];  Service: Vascular;  Laterality: N/A;  . LAPAROSCOPIC GASTRIC SLEEVE RESECTION N/A 07/19/2012   Procedure: LAPAROSCOPIC SLEEVE GASTRECTOMY with EGD;  Surgeon: Madilyn Hook, DO;  Location: [facility];  Service: General;  Laterality: N/A;  laparoscopic sleeve gastrectomy with EGD  . PERIPHERAL VASCULAR CATHETERIZATION Left 05/23/2015   Procedure: Nolon Stalls;  Surgeon: Conrad Stevensville, MD;  Location: [place] [facility];  Service: Cardiovascular;  Laterality: Left;  upper aRM  . PERIPHERAL VASCULAR CATHETERIZATION Left 05/23/2015   Procedure: Peripheral Vascular Balloon Angioplasty;  Surgeon: Conrad Bergenfield, MD;  Location: [place] [facility];  Service: Cardiovascular;  Laterality: Left;  av fistula  . TUBAL LIGATION  1993  . UPPER GI ENDOSCOPY N/A 07/19/2012   Procedure: UPPER GI ENDOSCOPY;   Surgeon: Madilyn Hook, DO;  Location: [facility];  Service: General;  Laterality: N/A;    Social History   Socioeconomic History  . Marital status: Married    Spouse name: Not on file  . Number of children: Not on file  . Years of education: Not on file  . Highest education level: Not on file  Occupational History  . Not on file  Social Needs  . Financial resource strain: Not on file  . Food insecurity:    Worry: Not on file    Inability: Not on file  . Transportation needs:    Medical: Not on file    Non-medical: Not on file  Tobacco Use  . Smoking status: Former Smoker    Packs/day: 1.00    Years: 16.00    Pack years: 16.00    Types: Cigarettes  Last attempt to quit: 2009    Years since quitting: 10.4  . Smokeless tobacco: Never Used  Substance and Sexual Activity  . Alcohol use: Never    Frequency: Never  . Drug use: No  . Sexual activity: Yes    Birth control/protection: Post-menopausal  Lifestyle  . Physical activity:    Days per week: Not on file    Minutes per session: Not on file  . Stress: Not on file  Relationships  . Social connections:    Talks on phone: Not on file    Gets together: Not on file    Attends religious service: Not on file    Active member of club or organization: Not on file    Attends meetings of clubs or organizations: Not on file    Relationship status: Not on file  Other Topics Concern  . Not on file  Social History Narrative  . Not on file    Family History  Problem Relation Age of Onset  . Hypertension Mother   . Mitral valve prolapse Mother   . Diabetes Father   . Arthritis Other   . Hyperlipidemia Other   . Cancer Neg Hx   . Heart disease Neg Hx   . Kidney disease Neg Hx   . Stroke Neg Hx     Review of Systems  Constitutional: Negative for chills and fever.  HENT: Positive for dental problem (tooth pain, bad taste in mouth). Negative for sore throat and trouble swallowing.        No swollen glands in neck    Neurological: Negative for light-headedness and headaches.       Objective:   Vitals:   07/15/17 1129  BP: (!) 154/100  Pulse: 60  Resp: 16  Temp: 98.5 F (36.9 C)   BP Readings from Last 3 Encounters:  07/15/17 (!) 154/100  06/21/17 120/70  05/21/17 (!) 146/93   Wt Readings from Last 3 Encounters:  07/15/17 298 lb (135.2 kg)  06/21/17 (!) 302 lb 12 oz (137.3 kg)  05/21/17 282 lb (127.9 kg)   Body mass index is 49.59 kg/m.   Physical Exam  Constitutional: She appears well-developed and well-nourished. No distress.  HENT:  Head: Normocephalic and atraumatic.  Mouth/Throat: Oropharynx is clear and moist. No oropharyngeal exudate.  Poor dentition, multiple teeth missing.  No obvious gum swelling right bottom jaw, no active discharge from teeth or gums  Lymphadenopathy:    She has no cervical adenopathy.  Skin: Skin is warm and dry. She is not diaphoretic.           Assessment & Plan:    See Problem List for Assessment and Plan of chronic medical problems.

## 2017-07-15 NOTE — Patient Instructions (Signed)
Take the antibiotic as prescribed.  Take tylenol for your pain.     See the dentist on Monday.

## 2017-07-21 ENCOUNTER — Ambulatory Visit: Payer: 59

## 2017-07-27 LAB — HEMOGLOBIN A1C: Hemoglobin A1C: 6.8

## 2017-08-09 ENCOUNTER — Ambulatory Visit: Payer: Self-pay

## 2017-08-09 ENCOUNTER — Ambulatory Visit (INDEPENDENT_AMBULATORY_CARE_PROVIDER_SITE_OTHER): Payer: 59 | Admitting: Sports Medicine

## 2017-08-09 ENCOUNTER — Encounter: Payer: Self-pay | Admitting: Sports Medicine

## 2017-08-09 VITALS — BP 136/84 | HR 80 | Ht 65.0 in | Wt 298.8 lb

## 2017-08-09 DIAGNOSIS — M65332 Trigger finger, left middle finger: Secondary | ICD-10-CM | POA: Diagnosis not present

## 2017-08-09 NOTE — Patient Instructions (Signed)

## 2017-08-09 NOTE — Progress Notes (Signed)
[name]. [name], [name] [facility] [place]  Brenda Davis - 52 y.o. female MRN [id number]  Date of birth: 1965/08/24  Visit Date: 08/09/2017  PCP: [name], MD   Referred by: [name], MD  Scribe(s) for today's visit: Josepha Pigg, CMA  SUBJECTIVE:  Brenda Davis is here for No chief complaint on file.   Her trigger finger symptoms INITIALLY: Began about 3 months ago. She has hx of trigger finger. She types all day with work and pain will set in after a couple of hours typing.  Described as moderate catching, radiating to the palm of the L hand. She does have occasional throbbing in the hand.  Worsened with typing.  Improved with warm compress Additional associated symptoms include: She has noticed some swelling around the finger. She denies increased warmth or redness.     At this time symptoms are worsening compared to onset  She has tried taking Tylenol with some relief.   She was seen by Dr. Hulan Saas in the past. She received steroid injection for L middle finger and R index finger 09/14/2016.    REVIEW OF SYSTEMS: Reports night time disturbances. Denies fevers, chills, or night sweats. Denies unexplained weight loss. Denies personal history of cancer. Denies changes in bowel or bladder habits. Denies recent unreported falls. Denies new or worsening dyspnea or wheezing. Denies headaches or dizziness.  Reports numbness, tingling or weakness  In the extremities.  Denies dizziness or presyncopal episodes Reports lower extremity edema    HISTORY & PERTINENT PRIOR DATA:  Prior History reviewed and updated per electronic medical record.  Significant/pertinent history, findings, studies include:  reports that she quit smoking about 10 years ago. Her smoking use included cigarettes. She has a 16.00 pack-year smoking history. She has never used smokeless tobacco. Recent Labs      05/24/17  HGBA1C 5.8   No specialty comments available. No problems updated.  OBJECTIVE:  VS:  HT:5\' 5"  (165.1 cm)   WT:298 lb 12.8 oz (135.5 kg)  BMI:49.72    BP:136/84  HR:80bpm  TEMP: ( )  RESP:99 %   PHYSICAL EXAM: Constitutional: WDWN, Non-toxic appearing. Psychiatric: Alert & appropriately interactive.  Not depressed or anxious appearing. Respiratory: No increased work of breathing.  Trachea Midline Eyes: Pupils are equal.  EOM intact without nystagmus.  No scleral icterus  Vascular Exam: warm to touch no edema  upper extremity neuro exam: unremarkable  MSK Exam: Left hand has generalized swelling of the middle finger circumferentially with restricted finger extension and flexion by approximately 30 degrees in each direction.  She has marked pain over the A1 pulley.   ASSESSMENT & PLAN:   1. Trigger finger, left middle finger     PLAN: Ultrasound-guided injection of the A1 pulley.  She does have some osteoarthritis of the MCP is likely contributing to the irritation in this area.  I would like to check on her in 8 weeks and repeat ultrasound at that time to ensure improved tendon glide.  Follow-up: Return in about 8 weeks (around 10/04/2017) for repeat diagnostic ultrasound.      Please see additional documentation for Objective, Assessment and Plan sections. Pertinent additional documentation may be included in corresponding procedure notes, imaging studies, problem based documentation and patient instructions. Please see these sections of the encounter for additional information regarding this visit.  CMA/ATC served as Education administrator during this visit. History, Physical, and Plan performed by medical  provider. Documentation and orders reviewed and attested to.      Gerda Diss, [facility] Physician

## 2017-08-09 NOTE — Progress Notes (Signed)
PROCEDURE NOTE:  Ultrasound Guided: Injection: Left 4th finger trigger finger, flexor tendon sheath injection Images were obtained and interpreted by myself, Teresa Coombs, DO  Images have been saved and stored to PACS system. Images obtained on: GE S7 Ultrasound machine    ULTRASOUND FINDINGS:  Marked thickening of the A1 pulley as well as very poor tendon glide.  She has calcific changes adjacent to the MCP consistent with moderate to severe osteoarthritis.    DESCRIPTION OF PROCEDURE:  The patient's clinical condition is marked by substantial pain and/or significant functional disability. Other conservative therapy has not provided relief, is contraindicated, or not appropriate. There is a reasonable likelihood that injection will significantly improve the patient's pain and/or functional impairment.   After discussing the risks, benefits and expected outcomes of the injection and all questions were reviewed and answered, the patient wished to undergo the above named procedure.  Verbal consent was obtained.  The ultrasound was used to identify the target structure and adjacent neurovascular structures. The skin was then prepped in sterile fashion and the target structure was injected under direct visualization using sterile technique as below:  Single injection performed as below: PREP: Alcohol, Ethel Chloride and 1 cc 1% lidocaine on Insulin Needle APPROACH:direct, single injection, 25g 1.5 in. INJECTATE: 0.5 cc 0.5% Marcaine and 0.5 cc 40mg /mL DepoMedrol ASPIRATE: None DRESSING: Band-Aid   Post procedural instructions including recommending icing and warning signs for infection were reviewed.    This procedure was well tolerated and there were no complications.   IMPRESSION: Succesful Ultrasound Guided: Injection -

## 2017-08-10 NOTE — Telephone Encounter (Signed)
help

## 2017-08-12 ENCOUNTER — Encounter: Payer: Self-pay | Admitting: Internal Medicine

## 2017-08-24 ENCOUNTER — Ambulatory Visit (INDEPENDENT_AMBULATORY_CARE_PROVIDER_SITE_OTHER)
Admission: RE | Admit: 2017-08-24 | Discharge: 2017-08-24 | Disposition: A | Discharge status: Home / Self Care | Payer: 59 | Source: Ambulatory Visit | Attending: Internal Medicine | Admitting: Internal Medicine

## 2017-08-24 ENCOUNTER — Other Ambulatory Visit (INDEPENDENT_AMBULATORY_CARE_PROVIDER_SITE_OTHER): Payer: 59

## 2017-08-24 ENCOUNTER — Telehealth: Payer: Self-pay

## 2017-08-24 ENCOUNTER — Inpatient Hospital Stay (HOSPITAL_COMMUNITY)
Admission: EM | Admit: 2017-08-24 | Discharge: 2017-08-28 | DRG: 438 | Disposition: A | Discharge status: Home / Self Care | Payer: 59 | Attending: Internal Medicine | Admitting: Internal Medicine

## 2017-08-24 ENCOUNTER — Encounter (HOSPITAL_COMMUNITY): Payer: Self-pay | Admitting: Emergency Medicine

## 2017-08-24 ENCOUNTER — Encounter: Payer: Self-pay | Admitting: Internal Medicine

## 2017-08-24 ENCOUNTER — Other Ambulatory Visit: Payer: Self-pay

## 2017-08-24 ENCOUNTER — Ambulatory Visit (INDEPENDENT_AMBULATORY_CARE_PROVIDER_SITE_OTHER): Payer: 59 | Admitting: Internal Medicine

## 2017-08-24 VITALS — BP 122/86 | HR 76 | Temp 98.3°F | Resp 16 | Ht 65.0 in | Wt 288.4 lb

## 2017-08-24 DIAGNOSIS — N2581 Secondary hyperparathyroidism of renal origin: Secondary | ICD-10-CM | POA: Diagnosis present

## 2017-08-24 DIAGNOSIS — E1165 Type 2 diabetes mellitus with hyperglycemia: Secondary | ICD-10-CM | POA: Diagnosis present

## 2017-08-24 DIAGNOSIS — Z888 Allergy status to other drugs, medicaments and biological substances status: Secondary | ICD-10-CM

## 2017-08-24 DIAGNOSIS — R739 Hyperglycemia, unspecified: Secondary | ICD-10-CM

## 2017-08-24 DIAGNOSIS — K859 Acute pancreatitis without necrosis or infection, unspecified: Principal | ICD-10-CM | POA: Diagnosis present

## 2017-08-24 DIAGNOSIS — K55069 Acute infarction of intestine, part and extent unspecified: Secondary | ICD-10-CM | POA: Diagnosis present

## 2017-08-24 DIAGNOSIS — E1122 Type 2 diabetes mellitus with diabetic chronic kidney disease: Secondary | ICD-10-CM | POA: Diagnosis present

## 2017-08-24 DIAGNOSIS — R1010 Upper abdominal pain, unspecified: Secondary | ICD-10-CM

## 2017-08-24 DIAGNOSIS — R112 Nausea with vomiting, unspecified: Secondary | ICD-10-CM

## 2017-08-24 DIAGNOSIS — K8500 Idiopathic acute pancreatitis without necrosis or infection: Secondary | ICD-10-CM

## 2017-08-24 DIAGNOSIS — D539 Nutritional anemia, unspecified: Secondary | ICD-10-CM

## 2017-08-24 DIAGNOSIS — Z79899 Other long term (current) drug therapy: Secondary | ICD-10-CM

## 2017-08-24 DIAGNOSIS — E519 Thiamine deficiency, unspecified: Secondary | ICD-10-CM

## 2017-08-24 DIAGNOSIS — D510 Vitamin B12 deficiency anemia due to intrinsic factor deficiency: Secondary | ICD-10-CM | POA: Diagnosis not present

## 2017-08-24 DIAGNOSIS — R748 Abnormal levels of other serum enzymes: Secondary | ICD-10-CM

## 2017-08-24 DIAGNOSIS — I10 Essential (primary) hypertension: Secondary | ICD-10-CM | POA: Diagnosis present

## 2017-08-24 DIAGNOSIS — Z992 Dependence on renal dialysis: Secondary | ICD-10-CM

## 2017-08-24 DIAGNOSIS — N186 End stage renal disease: Secondary | ICD-10-CM | POA: Diagnosis present

## 2017-08-24 DIAGNOSIS — E785 Hyperlipidemia, unspecified: Secondary | ICD-10-CM | POA: Diagnosis present

## 2017-08-24 DIAGNOSIS — Z87891 Personal history of nicotine dependence: Secondary | ICD-10-CM

## 2017-08-24 DIAGNOSIS — Z9884 Bariatric surgery status: Secondary | ICD-10-CM

## 2017-08-24 DIAGNOSIS — R1013 Epigastric pain: Secondary | ICD-10-CM | POA: Insufficient documentation

## 2017-08-24 DIAGNOSIS — D631 Anemia in chronic kidney disease: Secondary | ICD-10-CM | POA: Diagnosis present

## 2017-08-24 DIAGNOSIS — A09 Infectious gastroenteritis and colitis, unspecified: Secondary | ICD-10-CM

## 2017-08-24 DIAGNOSIS — E1129 Type 2 diabetes mellitus with other diabetic kidney complication: Secondary | ICD-10-CM | POA: Diagnosis present

## 2017-08-24 DIAGNOSIS — I120 Hypertensive chronic kidney disease with stage 5 chronic kidney disease or end stage renal disease: Secondary | ICD-10-CM | POA: Diagnosis present

## 2017-08-24 DIAGNOSIS — N3000 Acute cystitis without hematuria: Secondary | ICD-10-CM

## 2017-08-24 DIAGNOSIS — Z6841 Body Mass Index (BMI) 40.0 and over, adult: Secondary | ICD-10-CM

## 2017-08-24 LAB — IBC PANEL
Iron: 41 ug/dL — ABNORMAL LOW (ref 42–145)
Saturation Ratios: 18.1 % — ABNORMAL LOW (ref 20.0–50.0)
Transferrin: 162 mg/dL — ABNORMAL LOW (ref 212.0–360.0)

## 2017-08-24 LAB — URINALYSIS, ROUTINE W REFLEX MICROSCOPIC
Bilirubin Urine: NEGATIVE
Glucose, UA: >=500 mg/dL — AB
Ketones, ur: NEGATIVE mg/dL
Nitrite: NEGATIVE
Protein, ur: 100 mg/dL — AB
Specific Gravity, Urine: 1.012 (ref 1.005–1.030)
WBC, UA: >50 WBC/hpf — ABNORMAL HIGH (ref 0–5)
pH: 7 (ref 5.0–8.0)

## 2017-08-24 LAB — CBC
HCT: 31.6 % — ABNORMAL LOW (ref 36.0–46.0)
Hemoglobin: 10 g/dL — ABNORMAL LOW (ref 12.0–15.0)
MCH: 31.7 pg (ref 26.0–34.0)
MCHC: 31.6 g/dL (ref 30.0–36.0)
MCV: 100.3 fL — ABNORMAL HIGH (ref 78.0–100.0)
Platelets: 337 10*3/uL (ref 150–400)
RBC: 3.15 MIL/uL — ABNORMAL LOW (ref 3.87–5.11)
RDW: 12.9 % (ref 11.5–15.5)
WBC: 9.8 10*3/uL (ref 4.0–10.5)

## 2017-08-24 LAB — COMPREHENSIVE METABOLIC PANEL
ALT: 10 U/L (ref 0–35)
ALT: 14 U/L (ref 0–44)
AST: 8 U/L (ref 0–37)
AST: 12 U/L — ABNORMAL LOW (ref 15–41)
Albumin: 3.7 g/dL (ref 3.5–5.2)
Albumin: 3.2 g/dL — ABNORMAL LOW (ref 3.5–5.0)
Alkaline Phosphatase: 76 U/L (ref 39–117)
Alkaline Phosphatase: 80 U/L (ref 38–126)
Anion gap: 14 (ref 5–15)
BUN: 59 mg/dL — ABNORMAL HIGH (ref 6–23)
BUN: 60 mg/dL — ABNORMAL HIGH (ref 6–20)
CO2: 27 mEq/L (ref 19–32)
CO2: 26 mmol/L (ref 22–32)
Calcium: 9.2 mg/dL (ref 8.4–10.5)
Calcium: 8.9 mg/dL (ref 8.9–10.3)
Chloride: 92 mEq/L — ABNORMAL LOW (ref 96–112)
Chloride: 96 mmol/L — ABNORMAL LOW (ref 98–111)
Creatinine, Ser: 13.56 mg/dL (ref 0.40–1.20)
Creatinine, Ser: 14.37 mg/dL — ABNORMAL HIGH (ref 0.44–1.00)
GFR calc Af Amer: 3 mL/min — ABNORMAL LOW (ref >60)
GFR calc non Af Amer: 3 mL/min — ABNORMAL LOW (ref >60)
GFR: 3.69 mL/min — CL (ref >60.00)
Glucose, Bld: 444 mg/dL — ABNORMAL HIGH (ref 70–99)
Glucose, Bld: 394 mg/dL — ABNORMAL HIGH (ref 70–99)
Potassium: 4.6 mEq/L (ref 3.5–5.1)
Potassium: 4.4 mmol/L (ref 3.5–5.1)
Sodium: 132 mEq/L — ABNORMAL LOW (ref 135–145)
Sodium: 136 mmol/L (ref 135–145)
Total Bilirubin: 0.4 mg/dL (ref 0.2–1.2)
Total Bilirubin: 0.8 mg/dL (ref 0.3–1.2)
Total Protein: 7.1 g/dL (ref 6.0–8.3)
Total Protein: 6.6 g/dL (ref 6.5–8.1)

## 2017-08-24 LAB — VITAMIN B12: Vitamin B-12: 663 pg/mL (ref 211–911)

## 2017-08-24 LAB — LIPASE: Lipase: 884 U/L — ABNORMAL HIGH (ref 11.0–59.0)

## 2017-08-24 LAB — CBC WITH DIFFERENTIAL/PLATELET
Basophils Absolute: 0 10*3/uL (ref 0.0–0.1)
Basophils Relative: 0.4 % (ref 0.0–3.0)
Eosinophils Absolute: 0.3 10*3/uL (ref 0.0–0.7)
Eosinophils Relative: 3.5 % (ref 0.0–5.0)
HCT: 31.3 % — ABNORMAL LOW (ref 36.0–46.0)
Hemoglobin: 10.3 g/dL — ABNORMAL LOW (ref 12.0–15.0)
Lymphocytes Relative: 15.7 % (ref 12.0–46.0)
Lymphs Abs: 1.3 10*3/uL (ref 0.7–4.0)
MCHC: 32.8 g/dL (ref 30.0–36.0)
MCV: 98.4 fl (ref 78.0–100.0)
Monocytes Absolute: 0.6 10*3/uL (ref 0.1–1.0)
Monocytes Relative: 7.2 % (ref 3.0–12.0)
Neutro Abs: 6.1 10*3/uL (ref 1.4–7.7)
Neutrophils Relative %: 73.2 % (ref 43.0–77.0)
Platelets: 322 10*3/uL (ref 150.0–400.0)
RBC: 3.18 Mil/uL — ABNORMAL LOW (ref 3.87–5.11)
RDW: 14.4 % (ref 11.5–15.5)
WBC: 8.3 10*3/uL (ref 4.0–10.5)

## 2017-08-24 LAB — LIPASE, BLOOD: Lipase: 682 U/L — ABNORMAL HIGH (ref 11–51)

## 2017-08-24 LAB — FOLATE: Folate: 10.3 ng/mL (ref >5.9)

## 2017-08-24 LAB — AMYLASE: Amylase: 59 U/L (ref 27–131)

## 2017-08-24 LAB — FERRITIN: Ferritin: 946.3 ng/mL — ABNORMAL HIGH (ref 10.0–291.0)

## 2017-08-24 LAB — CBG MONITORING, ED: Glucose-Capillary: 364 mg/dL — ABNORMAL HIGH (ref 70–99)

## 2017-08-24 MED ORDER — SODIUM CHLORIDE 0.9 % IV BOLUS
1000.0000 mL | Freq: Once | INTRAVENOUS | Status: AC
  Administered 2017-08-25: 1000 mL via INTRAVENOUS

## 2017-08-24 MED ORDER — SODIUM CHLORIDE 0.9 % IV SOLN
1.0000 g | Freq: Once | INTRAVENOUS | Status: AC
  Administered 2017-08-25: 1 g via INTRAVENOUS
  Filled 2017-08-24: qty 10

## 2017-08-24 MED ORDER — MORPHINE SULFATE (PF) 4 MG/ML IV SOLN
4.0000 mg | Freq: Once | INTRAVENOUS | Status: AC
  Administered 2017-08-25: 4 mg via INTRAVENOUS
  Filled 2017-08-24: qty 1

## 2017-08-24 MED ORDER — ONDANSETRON HCL 4 MG/2ML IJ SOLN
4.0000 mg | Freq: Once | INTRAMUSCULAR | Status: AC
  Administered 2017-08-25: 4 mg via INTRAVENOUS
  Filled 2017-08-24: qty 2

## 2017-08-24 NOTE — Progress Notes (Signed)
Subjective:  Patient ID: Brenda Davis, female    DOB: 04/28/1965  Age: 52 y.o. MRN: [id number]  CC: Abdominal Pain   HPI Brenda Davis presents for a 2-week history of crampy abdominal pain, nausea, vomiting, diarrhea, and loss of appetite.  She last vomited 2 days ago and says the vomitus was yellow.  She denies any episodes of coffee-ground emesis or hematemesis.  She says the diarrhea has been watery with no blood, mucus, or melena.  Outpatient Medications Prior to Visit  Medication Sig Dispense Refill  . acetaminophen (TYLENOL) 500 MG tablet Take 1,000 mg by mouth every 6 (six) hours as needed (for pain).    Lorin Picket 1 GM 210 MG(Fe) tablet TAKE 1 TABLET BY MOUTH THREE TIMES A DAY WITH MEALS.   SWALLOW WHOLE, DO NOT CHEW OR CRUSH MEDICATION  3  . calcium acetate (PHOSLO) 667 MG capsule Take 2 capsules (1,334 mg total) by mouth 3 (three) times daily with meals. 180 capsule 0  . carvedilol (COREG) 6.25 MG tablet Take 6.25 mg by mouth 2 (two) times daily with a meal.  12  . clindamycin (CLEOCIN) 300 MG capsule Take 1 capsule (300 mg total) by mouth 3 (three) times daily. 30 capsule 0  . cyclobenzaprine (FLEXERIL) 10 MG tablet Take 0.5-1 tablets (5-10 mg total) by mouth 2 (two) times daily as needed for muscle spasms. 20 tablet 0  . folic acid (FOLVITE) 1 MG tablet Take 1 tablet (1 mg total) by mouth daily. 90 tablet 1  . gentamicin cream (GARAMYCIN) 0.1 % APPLY TO EXIT SITE DAILY AS DIRECTED  4  . heparin 1000 UNIT/ML injection ADMINISTER AS DIRECTED  99  . irbesartan (AVAPRO) 300 MG tablet TAKE 1 TABLET BY MOUTH EVERYDAY AT BEDTIME 90 tablet 0  . Multiple Vitamins-Minerals (PRORENAL + D) TABS Take 1 tablet by mouth daily.  4  . multivitamin (RENA-VIT) TABS tablet Take 1 tablet by mouth at bedtime. 30 tablet 0  . polyethylene glycol (MIRALAX / GLYCOLAX) packet Take 17 g by mouth daily as needed. (Patient taking differently: Take 17 g by mouth daily as needed for  mild constipation. ) 14 each 0  . SENSIPAR 30 MG tablet Take 30 mg by mouth every Monday, Wednesday, and Friday.   6  . thiamine (VITAMIN B-1) 100 MG tablet Take 1 tablet (100 mg total) by mouth daily. 90 tablet 1   No facility-administered medications prior to visit.     ROS Review of Systems  Constitutional: Positive for appetite change. Negative for activity change, chills, diaphoresis, fatigue, fever and unexpected weight change.  HENT: Negative.  Negative for trouble swallowing.   Eyes: Negative.   Respiratory: Negative.  Negative for cough, chest tightness and shortness of breath.   Cardiovascular: Negative for chest pain, palpitations and leg swelling.  Gastrointestinal: Positive for abdominal pain, diarrhea, nausea and vomiting. Negative for blood in stool and constipation.  Endocrine: Negative.   Genitourinary: Negative.  Negative for difficulty urinating, dysuria, hematuria and urgency.  Musculoskeletal: Negative.   Skin: Negative.   Neurological: Negative.  Negative for dizziness and weakness.  Hematological: Negative for adenopathy. Does not bruise/bleed easily.  Psychiatric/Behavioral: Negative.     Objective:  BP 122/86 (BP Location: Right Arm, Patient Position: Sitting, Cuff Size: Large)   Pulse 76   Temp 98.3 F (36.8 C) (Oral)   Resp 16   Ht 5\' 5"  (1.651 m)   Wt 288 lb 6.4 oz (130.8 kg)   SpO2 97%  BMI 47.99 kg/m   BP Readings from Last 3 Encounters:  08/24/17 122/86  08/09/17 136/84  07/15/17 (!) 154/100    Wt Readings from Last 3 Encounters:  08/24/17 288 lb 6.4 oz (130.8 kg)  08/09/17 298 lb 12.8 oz (135.5 kg)  07/15/17 298 lb (135.2 kg)    Physical Exam  Constitutional: She is oriented to person, place, and time. She appears well-developed and well-nourished.  Non-toxic appearance. She has a sickly appearance. She does not appear ill. No distress.  HENT:  Mouth/Throat: Oropharynx is clear and moist. No oropharyngeal exudate.  Eyes: No scleral  icterus.  Neck: Normal range of motion. No JVD present. No thyromegaly present.  Cardiovascular: Normal rate, regular rhythm and normal heart sounds. Exam reveals no friction rub.  No murmur heard. Pulmonary/Chest: Effort normal and breath sounds normal. No respiratory distress. She has no wheezes. She has no rales.  Abdominal: Soft. Normal appearance. She exhibits no distension and no mass. Bowel sounds are decreased. There is no hepatosplenomegaly. There is tenderness in the epigastric area, periumbilical area and left lower quadrant. There is guarding. There is no tenderness at McBurney's point and negative Murphy's sign. No hernia.  Musculoskeletal: Normal range of motion. She exhibits no edema, tenderness or deformity.  Lymphadenopathy:    She has no cervical adenopathy.  Neurological: She is alert and oriented to person, place, and time.  Skin: Skin is warm and dry. No pallor.  Psychiatric: She has a normal mood and affect. Her behavior is normal. Judgment and thought content normal.    Lab Results  Component Value Date   WBC 8.3 08/24/2017   HGB 10.3 (L) 08/24/2017   HCT 31.3 (L) 08/24/2017   PLT 322.0 08/24/2017   GLUCOSE 444 (H) 08/24/2017   CHOL 201 (H) 12/05/2015   TRIG 86.0 12/05/2015   HDL 58.10 12/05/2015   LDLDIRECT 161.6 11/28/2012   LDLCALC 125 (H) 12/05/2015   ALT 10 08/24/2017   AST 8 08/24/2017   NA 132 (L) 08/24/2017   K 4.6 08/24/2017   CL 92 (L) 08/24/2017   CREATININE 13.56 (HH) 08/24/2017   BUN 59 (H) 08/24/2017   CO2 27 08/24/2017   TSH 2.020 09/24/2016   INR 1.00 05/06/2016   HGBA1C 6.8 07/27/2017   MICROALBUR 159.00 (H) 09/17/2009    Dg Chest 2 View  Result Date: 05/21/2017 CLINICAL DATA:  RIGHT chest pain for 2 months EXAM: CHEST - 2 VIEW COMPARISON:  02/18/2007 FINDINGS: Normal mediastinum and cardiac silhouette. Normal pulmonary vasculature. No evidence of effusion, infiltrate, or pneumothorax. No acute bony abnormality. Degenerative  osteophytosis of the spine. IMPRESSION: No acute cardiopulmonary process. Electronically Signed   By: Suzy Bouchard M.D.   On: 05/21/2017 16:10   Dg Abd Acute W/chest  Result Date: 08/24/2017 CLINICAL DATA:  Diarrhea and generalized stomach pain, nausea, and vomiting for 2 weeks, former smoker, history hypertension, end-stage renal disease on dialysis, type II diabetes mellitus EXAM: DG ABDOMEN ACUTE W/ 1V CHEST COMPARISON:  Chest radiographs 05/21/2017, CT abdomen and pelvis 02/05/2015 FINDINGS: Upper normal heart size. Mediastinal contours and pulmonary vascularity normal. Atherosclerotic calcification at aorta Lungs clear. No pulmonary infiltrate, pleural effusion or pneumothorax. Bones demineralized. Surgical clips/staples from history of gastric sleeve resection. Peritoneal dialysis catheter. Bowel gas pattern normal. No bowel dilatation bowel wall thickening, or free air. Osseous structures unremarkable. Calcification in LEFT mid abdomen is likely a tiny calcified lymph node by prior CT. IMPRESSION: Normal bowel gas pattern. Clear lungs. No acute abnormalities. Electronically  Signed   By: Lavonia Dana M.D.   On: 08/24/2017 11:21     Assessment & Plan:   Daisey was seen today for abdominal pain.  Diagnoses and all orders for this visit:  Pain of upper abdomen- Her lab work is remarkable for a significantly elevated lipase level.  Her plain films are normal.  I am concerned she has developed pancreatitis but with her history of renal failure I do not want her to undergo a contrast study so I have ordered a CT of the abdomen and pelvis without contrast to confirm the presence or absence of pancreatitis.  Late note, her blood sugar is elevated at 444 so I recommended that she go to the ED to receive IV fluids and insulin to normalize her blood sugar and for work-up of her pancreas. -     DG Abd Acute W/Chest; Future -     Lipase; Future -     Amylase; Future -     CT ABDOMEN PELVIS WO CONTRAST;  Future  Intractable vomiting with nausea, unspecified vomiting type- see above. -     DG Abd Acute W/Chest; Future -     Lipase; Future -     Amylase; Future -     Comprehensive metabolic panel; Future -     CT ABDOMEN PELVIS WO CONTRAST; Future  Vitamin B12 deficiency anemia due to intrinsic factor deficiency -     CBC with Differential/Platelet; Future -     Vitamin B12; Future -     Folate; Future  Thiamine deficiency -     CBC with Differential/Platelet; Future -     Vitamin B1; Future  Deficiency anemia -     CBC with Differential/Platelet; Future -     Vitamin B1; Future -     IBC panel; Future -     Vitamin B12; Future -     Ferritin; Future -     Folate; Future  Diarrhea of infectious origin -     GI pathogen panel by PCR, stool; Future -     Gastrointestinal Pathogen Panel PCR; Future  Elevated lipase -     CT ABDOMEN PELVIS WO CONTRAST; Future  Idiopathic acute pancreatitis without infection or necrosis -     CT ABDOMEN PELVIS WO CONTRAST; Future   I am having Brenda Davis maintain her calcium acetate, multivitamin, polyethylene glycol, heparin, SENSIPAR, AURYXIA, PRORENAL + D, gentamicin cream, acetaminophen, cyclobenzaprine, irbesartan, thiamine, folic acid, carvedilol, and clindamycin.  No orders of the defined types were placed in this encounter.    Follow-up: Return in about 1 month (around 09/21/2017).  Scarlette Calico, MD

## 2017-08-24 NOTE — Patient Instructions (Signed)
Anemia Anemia is a condition in which you do not have enough red blood cells or hemoglobin. Hemoglobin is a substance in red blood cells that carries oxygen. When you do not have enough red blood cells or hemoglobin (are anemic), your body cannot get enough oxygen and your organs may not work properly. As a result, you may feel very tired or have other problems. What are the causes? Common causes of anemia include:  Excessive bleeding. Anemia can be caused by excessive bleeding inside or outside the body, including bleeding from the intestine or from periods in women.  Poor nutrition.  Long-lasting (chronic) kidney, thyroid, and liver disease.  Bone marrow disorders.  Cancer and treatments for cancer.  HIV (human immunodeficiency virus) and AIDS (acquired immunodeficiency syndrome).  Treatments for HIV and AIDS.  Spleen problems.  Blood disorders.  Infections, medicines, and autoimmune disorders that destroy red blood cells.  What are the signs or symptoms? Symptoms of this condition include:  Minor weakness.  Dizziness.  Headache.  Feeling heartbeats that are irregular or faster than normal (palpitations).  Shortness of breath, especially with exercise.  Paleness.  Cold sensitivity.  Indigestion.  Nausea.  Difficulty sleeping.  Difficulty concentrating.  Symptoms may occur suddenly or develop slowly. If your anemia is mild, you may not have symptoms. How is this diagnosed? This condition is diagnosed based on:  Blood tests.  Your medical history.  A physical exam.  Bone marrow biopsy.  Your health care provider may also check your stool (feces) for blood and may do additional testing to look for the cause of your bleeding. You may also have other tests, including:  Imaging tests, such as a CT scan or MRI.  Endoscopy.  Colonoscopy.  How is this treated? Treatment for this condition depends on the cause. If you continue to lose a lot of blood,  you may need to be treated at a hospital. Treatment may include:  Taking supplements of iron, vitamin B12, or folic acid.  Taking a hormone medicine (erythropoietin) that can help to stimulate red blood cell growth.  Having a blood transfusion. This may be needed if you lose a lot of blood.  Making changes to your diet.  Having surgery to remove your spleen.  Follow these instructions at home:  Take over-the-counter and prescription medicines only as told by your health care provider.  Take supplements only as told by your health care provider.  Follow any diet instructions that you were given.  Keep all follow-up visits as told by your health care provider. This is important. Contact a health care provider if:  You develop new bleeding anywhere in the body. Get help right away if:  You are very weak.  You are short of breath.  You have pain in your abdomen or chest.  You are dizzy or feel faint.  You have trouble concentrating.  You have bloody or black, tarry stools.  You vomit repeatedly or you vomit up blood. Summary  Anemia is a condition in which you do not have enough red blood cells or enough of a substance in your red blood cells that carries oxygen (hemoglobin).  Symptoms may occur suddenly or develop slowly.  If your anemia is mild, you may not have symptoms.  This condition is diagnosed with blood tests as well as a medical history and physical exam. Other tests may be needed.  Treatment for this condition depends on the cause of the anemia. This information is not intended to replace advice   given to you by your health care provider. Make sure you discuss any questions you have with your health care provider. Document Released: 02/13/2004 Document Revised: 02/07/2016 Document Reviewed: 02/07/2016 Elsevier Interactive Patient Education  [employer].

## 2017-08-24 NOTE — Telephone Encounter (Signed)
CRITICAL VALUE STICKER  CRITICAL VALUE: Creatinine:13.56 GFR:3.69  RECEIVER (on-site recipient of call):Brenda Davis   DATE & TIME NOTIFIED: 1:04  MESSENGER (representative from lab): Esperanza Richters  MD NOTIFIED: Ronnald Ramp  TIME OF NOTIFICATION: 1:06  RESPONSE:

## 2017-08-24 NOTE — ED Provider Notes (Signed)
[facility] EMERGENCY DEPARTMENT Provider Note   CSN: [id number] Arrival date & time: 08/24/17  1653     History   Chief Complaint Chief Complaint  Patient presents with  . Nausea  . Hyperglycemia    HPI Brenda Davis is a 52 y.o. female.  Brenda Davis is a 51 y.o. Female with a history of ESRD on peritoneal dialysis, retention, hyperlipidemia, type 2 diabetes which was corrected with gastric sleeve surgery, of note, the and gout who presents to the emergency department while after being sent by her PCP for abnormal lab.  She reports she was called by her primary care doctor due to glucose level > 400 and lipase of 884.  Patient reports she went to her primary care doctor today for evaluation of nausea epigastric and lower abdominal pain and weakness.  She reports over the past week she has had some intermittent lower abdominal pain and sensation of pressure with urination, no burning but does report some increased urinary frequency.  But over the past 2 days she has had some epigastric abdominal pain and has felt very nauseated with few episodes of nonbloody nonbilious emesis.  She denies diarrhea, melena or hematochezia.  Aside from having her peritoneal dialysis catheter placed she denies any other history of abdominal surgeries.  No recent complications with peritoneal dialysis.  Patient had a history of type 2 diabetes which was corrected with gastric sleeve surgery, but she reports over the past few months since she started peritoneal dialysis her sugar seems to have been creeping up and it was significantly higher than usual today.  No associated chest pain, shortness of breath, cough, fevers or chills.      Past Medical History:  Diagnosis Date  . Anemia   . Chronic kidney disease    STAGE 3 CHRONIC KIDNEY DISEASE SECONDARY TO DIABETIC GLOMERULOSCLEROSIS AND UNCONTROLLED HYPERTENSION - PER OFFICE NOTES DR. Fl[facility].    Marland Kitchen ESRD on dialysis [facility])    "MWF; [address] (05/06/2016)  . Gout   . HCAP (healthcare-associated pneumonia) 05/06/2016  . History of blood transfusion    "related to surgery"  . Hyperlipidemia   . Hypertension   . Morbid obesity ([place])   . Pain    LEFT SHOULDER PAIN - WAS SEEN AT AN URGENT CARE - GIVEN SLING FOR COMFORT AND TOLD ROM AS TOLERATED.  Marland Kitchen Palpitations 09/24/2016  . Type II diabetes mellitus ([place])    "gastric sleeve OR corrected this" (05/06/2016)    Patient Active Problem List   Diagnosis Date Noted  . Pain of upper abdomen 08/24/2017  . Deficiency anemia 08/24/2017  . Diarrhea of infectious origin 08/24/2017  . Elevated lipase 08/24/2017  . Chronic heart failure with preserved ejection fraction ([place]) 06/22/2017  . Allergic rhinitis 06/27/2016  . Severe persistent asthma with exacerbation 05/12/2016  . Anemia due to chronic kidney disease 05/06/2016  . Routine general medical examination at a health care facility 12/05/2015  . Cervical cancer screening 12/05/2015  . Visit for screening mammogram 01/23/2015  . Sinus pause 01/04/2015  . Hyperglycemia 12/31/2014  . Gout due to renal impairment 09/13/2014  . Thiamine deficiency 12/02/2012  . Meralgia paraesthetica 11/28/2012  . S/P laparoscopic sleeve gastrectomy 11/28/2012  . B12 deficiency anemia 11/28/2012  . Intractable vomiting with nausea 09/26/2012  . Hyperlipidemia with target LDL less than 100 04/09/2008  . CKD (chronic kidney disease) stage V requiring chronic dialysis (MWF) 02/08/2008  . HYPERTENSION, BENIGN ESSENTIAL 01/20/1999  Past Surgical History:  Procedure Laterality Date  . AV FISTULA PLACEMENT Left 01/03/2015   Procedure: BRACHIAL CEPHALIC ARTERIOVENOUS  FISTULA CREATION LEFT ARM;  Surgeon: Angelia Mould, MD;  Location: [facility];  Service: Vascular;  Laterality: Left;  . CARPAL TUNNEL RELEASE Right   . [place]  . ESOPHAGOGASTRODUODENOSCOPY N/A 09/04/2012    Procedure: ESOPHAGOGASTRODUODENOSCOPY (EGD);  Surgeon: Shann Medal, MD;  Location: [facility];  Service: General;  Laterality: N/A;  PF  . ESOPHAGOGASTRODUODENOSCOPY (EGD) WITH ESOPHAGEAL DILATION N/A 09/29/2012   Procedure: ESOPHAGOGASTRODUODENOSCOPY (EGD) WITH ESOPHAGEAL DILATION;  Surgeon: Milus Banister, MD;  Location: [facility];  Service: Endoscopy;  Laterality: N/A;  . INSERTION OF DIALYSIS CATHETER N/A 01/03/2015   Procedure: INSERTION OF DIALYSIS CATHETER RIGHT INTERNAL JUGULAR;  Surgeon: Angelia Mould, MD;  Location: [facility];  Service: Vascular;  Laterality: N/A;  . LAPAROSCOPIC GASTRIC SLEEVE RESECTION N/A 07/19/2012   Procedure: LAPAROSCOPIC SLEEVE GASTRECTOMY with EGD;  Surgeon: Madilyn Hook, DO;  Location: [facility];  Service: General;  Laterality: N/A;  laparoscopic sleeve gastrectomy with EGD  . PERIPHERAL VASCULAR CATHETERIZATION Left 05/23/2015   Procedure: Nolon Stalls;  Surgeon: Conrad Fairburn, MD;  Location: [facility];  Service: Cardiovascular;  Laterality: Left;  upper aRM  . PERIPHERAL VASCULAR CATHETERIZATION Left 05/23/2015   Procedure: Peripheral Vascular Balloon Angioplasty;  Surgeon: Conrad [place], MD;  Location: [facility];  Service: Cardiovascular;  Laterality: Left;  av fistula  . TUBAL LIGATION  1993  . UPPER GI ENDOSCOPY N/A 07/19/2012   Procedure: UPPER GI ENDOSCOPY;  Surgeon: Madilyn Hook, DO;  Location: [facility];  Service: General;  Laterality: N/A;     OB History   None      Home Medications    Prior to Admission medications   Medication Sig Start Date End Date Taking? Authorizing Provider  acetaminophen (TYLENOL) 500 MG tablet Take 1,000 mg by mouth every 6 (six) hours as needed (for pain).   Yes [provider]  AURYXIA 1 GM 210 MG(Fe) tablet TAKE 1 TABLET BY MOUTH THREE TIMES A DAY WITH MEALS.   SWALLOW WHOLE, DO NOT CHEW OR CRUSH MEDICATION 04/07/17  Yes [provider]  calcium acetate (PHOSLO) 667 MG capsule Take 2  capsules (1,334 mg total) by mouth 3 (three) times daily with meals. 01/07/15  Yes Eugenie Filler, MD  carvedilol (COREG) 6.25 MG tablet Take 6.25 mg by mouth 2 (two) times daily with a meal. 07/07/17  Yes [provider]  cyclobenzaprine (FLEXERIL) 10 MG tablet Take 0.5-1 tablets (5-10 mg total) by mouth 2 (two) times daily as needed for muscle spasms. 05/21/17  Yes Harris, Abigail, PA-C  folic acid (FOLVITE) 1 MG tablet Take 1 tablet (1 mg total) by mouth daily. 06/21/17  Yes [name], MD  gentamicin cream (GARAMYCIN) 0.1 % APPLY TO EXIT SITE DAILY AS DIRECTED 04/26/17  Yes [provider]  heparin 1000 UNIT/ML injection ADMINISTER AS DIRECTED 04/26/17  Yes [provider]  irbesartan (AVAPRO) 300 MG tablet TAKE 1 TABLET BY MOUTH EVERYDAY AT BEDTIME 06/16/17  Yes [name], MD  multivitamin (RENA-VIT) TABS tablet Take 1 tablet by mouth at bedtime. 01/07/15  Yes Eugenie Filler, MD  polyethylene glycol [facility] Floria Raveling) packet Take 17 g by mouth daily as needed. Patient taking differently: Take 17 g by mouth daily as needed for mild constipation.  05/09/16  Yes Eugenie Filler, MD  thiamine (VITAMIN  B-1) 100 MG tablet Take 1 tablet (100 mg total) by mouth daily. 06/21/17  Yes [name], MD  clindamycin (CLEOCIN) 300 MG capsule Take 1 capsule (300 mg total) by mouth 3 (three) times daily. Patient not taking: Reported on 08/24/2017 07/15/17   Binnie Rail, MD    Family History Family History  Problem Relation Age of Onset  . Hypertension Mother   . Mitral valve prolapse Mother   . Diabetes Father   . Arthritis Other   . Hyperlipidemia Other   . Cancer Neg Hx   . Heart disease Neg Hx   . Kidney disease Neg Hx   . Stroke Neg Hx     Social History Social History   Tobacco Use  . Smoking status: Former Smoker    Packs/day: 1.00    Years: 16.00    Pack years: 16.00    Types: Cigarettes    Last attempt to quit: 2009    Years since quitting:  10.6  . Smokeless tobacco: Never Used  Substance Use Topics  . Alcohol use: Never    Frequency: Never  . Drug use: No     Allergies   Amlodipine; Clonidine derivatives; and Welchol [colesevelam hcl]   Review of Systems Review of Systems  Constitutional: Positive for chills. Negative for fever.  HENT: Negative for congestion, rhinorrhea and sore throat.   Eyes: Negative for visual disturbance.  Respiratory: Negative for cough and shortness of breath.   Cardiovascular: Negative for chest pain.  Gastrointestinal: Positive for abdominal pain, nausea and vomiting. Negative for blood in stool, constipation and diarrhea.  Genitourinary: Positive for dysuria and frequency. Negative for flank pain and hematuria.  Musculoskeletal: Negative for arthralgias, joint swelling and myalgias.  Skin: Negative for color change and rash.  Neurological: Negative for dizziness, syncope and light-headedness.     Physical Exam Updated Vital Signs BP (!) 146/90 (BP Location: Right Arm)   Pulse 81   Temp 98.1 F (36.7 C) (Oral)   Resp 18   SpO2 100%   Physical Exam  Constitutional: She is oriented to person, place, and time. She appears well-developed and well-nourished. No distress.  HENT:  Head: Normocephalic and atraumatic.  Mouth/Throat: Oropharynx is clear and moist.  Eyes: Right eye exhibits no discharge. Left eye exhibits no discharge.  Neck: Neck supple.  Cardiovascular: Normal rate, regular rhythm, normal heart sounds and intact distal pulses.  Pulmonary/Chest: Effort normal and breath sounds normal. No stridor. No respiratory distress. She has no wheezes. She has no rales.  Respirations equal and unlabored, patient able to speak in full sentences, lungs clear to auscultation bilaterally  Abdominal: Soft. Bowel sounds are normal. She exhibits no distension and no mass. There is tenderness. There is guarding.  Abdomen soft, nondistended, peritoneal dialysis catheter in place with no  surrounding erythema, bowel sounds present throughout, tenderness with guarding present in the epigastrium,   Musculoskeletal: She exhibits no edema or deformity.  Neurological: She is alert and oriented to person, place, and time. Coordination normal.  Skin: Skin is warm and dry. Capillary refill takes less than 2 seconds. She is not diaphoretic.  Psychiatric: She has a normal mood and affect. Her behavior is normal.  Nursing note and vitals reviewed.    ED Treatments / Results  Labs (all labs ordered are listed, but only abnormal results are displayed) Labs Reviewed  LIPASE, BLOOD - Abnormal; Notable for the following components:      Result Value   Lipase 682 (*)  All other components within normal limits  COMPREHENSIVE METABOLIC PANEL - Abnormal; Notable for the following components:   Chloride 96 (*)    Glucose, Bld 394 (*)    BUN 60 (*)    Creatinine, Ser 14.37 (*)    Albumin 3.2 (*)    AST 12 (*)    GFR calc non Af Amer 3 (*)    GFR calc Af Amer 3 (*)    All other components within normal limits  CBC - Abnormal; Notable for the following components:   RBC 3.15 (*)    Hemoglobin 10.0 (*)    HCT 31.6 (*)    MCV 100.3 (*)    All other components within normal limits  URINALYSIS, ROUTINE W REFLEX MICROSCOPIC - Abnormal; Notable for the following components:   APPearance TURBID (*)    Glucose, UA >=500 (*)    Hgb urine dipstick SMALL (*)    Protein, ur 100 (*)    Leukocytes, UA LARGE (*)    WBC, UA >50 (*)    Bacteria, UA RARE (*)    Non Squamous Epithelial 0-5 (*)    All other components within normal limits  CBG MONITORING, ED - Abnormal; Notable for the following components:   Glucose-Capillary 364 (*)    All other components within normal limits    EKG None  Radiology Dg Abd Acute W/chest  Result Date: 08/24/2017 CLINICAL DATA:  Diarrhea and generalized stomach pain, nausea, and vomiting for 2 weeks, former smoker, history hypertension, end-stage renal  disease on dialysis, type II diabetes mellitus EXAM: DG ABDOMEN ACUTE W/ 1V CHEST COMPARISON:  Chest radiographs 05/21/2017, CT abdomen and pelvis 02/05/2015 FINDINGS: Upper normal heart size. Mediastinal contours and pulmonary vascularity normal. Atherosclerotic calcification at aorta Lungs clear. No pulmonary infiltrate, pleural effusion or pneumothorax. Bones demineralized. Surgical clips/staples from history of gastric sleeve resection. Peritoneal dialysis catheter. Bowel gas pattern normal. No bowel dilatation bowel wall thickening, or free air. Osseous structures unremarkable. Calcification in LEFT mid abdomen is likely a tiny calcified lymph node by prior CT. IMPRESSION: Normal bowel gas pattern. Clear lungs. No acute abnormalities. Electronically Signed   By: Lavonia Dana M.D.   On: 08/24/2017 11:21    Procedures Procedures (including critical care time)  Medications Ordered in ED Medications  sodium chloride 0.9 % bolus 1,000 mL (1,000 mLs Intravenous New Bag/Given 08/25/17 0020)  iohexol (OMNIPAQUE) 300 MG/ML solution 100 mL (has no administration in time range)  ondansetron (ZOFRAN) injection 4 mg (4 mg Intravenous Given 08/25/17 0014)  morphine 4 MG/ML injection 4 mg (4 mg Intravenous Given 08/25/17 0014)  cefTRIAXone (ROCEPHIN) 1 g in sodium chloride 0.9 % 100 mL IVPB (0 g Intravenous Stopped 08/25/17 0104)     Initial Impression / Assessment and Plan / ED Course  I have reviewed the triage vital signs and the nursing notes.  Pertinent labs & imaging results that were available during my care of the patient were reviewed by me and considered in my medical decision making (see chart for details).  Patient presents from primary care office for elevated lipase and elevated glucose.  Reports lower abdominal pain for the past week but over the past few days also developed severe epigastric pain with nausea and vomiting.  On exam mildly hypertensive but vitals otherwise normal, afebrile.  Patient  also reports some urinary symptoms.  On exam she has epigastric tenderness with guarding and some mild lower abdominal tenderness.  Lab evaluation initiated from triage, significant for lipase  of 682, and glucose of 394.  Patient does not have a leukocytosis, hemoglobin is stable.  No other acute electrolyte derangements.  Creatinine is 14.37, but patient is a peritoneal dialysis patient, normal potassium.  LFTs are not elevated it to suggest gallstone pancreatitis and patient does not have focal right upper quadrant tenderness.  Urinalysis is consistent with infection with large leukocytes and bacteria present with some white blood cell clumps.  IV fluid bolus, Zofran, morphine for symptomatic treatment.  1 g IV Rocephin for urinary tract infection.  Will get CT abdomen pelvis.  Patient will require admission for acute pancreatitis.  At shift change care signed out to PA [facility] pending CT scan prior to admission.  Final Clinical Impressions(s) / ED Diagnoses   Final diagnoses:  Acute pancreatitis, unspecified complication status, unspecified pancreatitis type  Acute cystitis without hematuria  Hyperglycemia    ED Discharge Orders    None       Jacqlyn Larsen, [place] 08/25/17 0120    Sherwood Gambler, MD 08/26/17 [phone]

## 2017-08-24 NOTE — Telephone Encounter (Signed)
Pt is requesting a call back for an explanation of lab work that was sent to her phone.

## 2017-08-24 NOTE — ED Triage Notes (Signed)
Pt reports that she was seen by her doctor for Nausea, abdominal pain, and weakness x 1 week. She states they did blood work in the office and received a call stating that she had elevated lipase levels and a glucose level >400. Pt has a hx of diabetes, but received the gastric sleeve sx in 2014 and has not had DM since. Pt reports that she does do peritoneal dialysis at home every night as well. Reports that abdominal pain is below her bellybutton. CBG in triage 364.

## 2017-08-25 ENCOUNTER — Emergency Department (HOSPITAL_COMMUNITY): Payer: 59

## 2017-08-25 ENCOUNTER — Inpatient Hospital Stay (HOSPITAL_COMMUNITY): Payer: 59

## 2017-08-25 ENCOUNTER — Encounter (HOSPITAL_COMMUNITY): Payer: Self-pay | Admitting: Internal Medicine

## 2017-08-25 DIAGNOSIS — Z992 Dependence on renal dialysis: Secondary | ICD-10-CM

## 2017-08-25 DIAGNOSIS — E1121 Type 2 diabetes mellitus with diabetic nephropathy: Secondary | ICD-10-CM | POA: Diagnosis not present

## 2017-08-25 DIAGNOSIS — K55069 Acute infarction of intestine, part and extent unspecified: Secondary | ICD-10-CM | POA: Diagnosis present

## 2017-08-25 DIAGNOSIS — N2581 Secondary hyperparathyroidism of renal origin: Secondary | ICD-10-CM | POA: Diagnosis present

## 2017-08-25 DIAGNOSIS — E785 Hyperlipidemia, unspecified: Secondary | ICD-10-CM | POA: Diagnosis present

## 2017-08-25 DIAGNOSIS — N186 End stage renal disease: Secondary | ICD-10-CM

## 2017-08-25 DIAGNOSIS — Z9884 Bariatric surgery status: Secondary | ICD-10-CM | POA: Diagnosis not present

## 2017-08-25 DIAGNOSIS — N3000 Acute cystitis without hematuria: Secondary | ICD-10-CM | POA: Diagnosis not present

## 2017-08-25 DIAGNOSIS — K859 Acute pancreatitis without necrosis or infection, unspecified: Secondary | ICD-10-CM | POA: Diagnosis present

## 2017-08-25 DIAGNOSIS — Z79899 Other long term (current) drug therapy: Secondary | ICD-10-CM | POA: Diagnosis not present

## 2017-08-25 DIAGNOSIS — E1122 Type 2 diabetes mellitus with diabetic chronic kidney disease: Secondary | ICD-10-CM | POA: Diagnosis present

## 2017-08-25 DIAGNOSIS — Z888 Allergy status to other drugs, medicaments and biological substances status: Secondary | ICD-10-CM | POA: Diagnosis not present

## 2017-08-25 DIAGNOSIS — Z87891 Personal history of nicotine dependence: Secondary | ICD-10-CM | POA: Diagnosis not present

## 2017-08-25 DIAGNOSIS — E1129 Type 2 diabetes mellitus with other diabetic kidney complication: Secondary | ICD-10-CM | POA: Diagnosis present

## 2017-08-25 DIAGNOSIS — D631 Anemia in chronic kidney disease: Secondary | ICD-10-CM | POA: Diagnosis present

## 2017-08-25 DIAGNOSIS — E1165 Type 2 diabetes mellitus with hyperglycemia: Secondary | ICD-10-CM | POA: Diagnosis present

## 2017-08-25 DIAGNOSIS — K8500 Idiopathic acute pancreatitis without necrosis or infection: Secondary | ICD-10-CM | POA: Diagnosis not present

## 2017-08-25 DIAGNOSIS — I120 Hypertensive chronic kidney disease with stage 5 chronic kidney disease or end stage renal disease: Secondary | ICD-10-CM | POA: Diagnosis present

## 2017-08-25 DIAGNOSIS — R739 Hyperglycemia, unspecified: Secondary | ICD-10-CM | POA: Diagnosis present

## 2017-08-25 DIAGNOSIS — I10 Essential (primary) hypertension: Secondary | ICD-10-CM | POA: Diagnosis not present

## 2017-08-25 DIAGNOSIS — Z6841 Body Mass Index (BMI) 40.0 and over, adult: Secondary | ICD-10-CM | POA: Diagnosis not present

## 2017-08-25 LAB — CBC
HCT: 30.3 % — ABNORMAL LOW (ref 36.0–46.0)
Hemoglobin: 9.5 g/dL — ABNORMAL LOW (ref 12.0–15.0)
MCH: 31.6 pg (ref 26.0–34.0)
MCHC: 31.4 g/dL (ref 30.0–36.0)
MCV: 100.7 fL — ABNORMAL HIGH (ref 78.0–100.0)
Platelets: 279 10*3/uL (ref 150–400)
RBC: 3.01 MIL/uL — ABNORMAL LOW (ref 3.87–5.11)
RDW: 12.7 % (ref 11.5–15.5)
WBC: 8.3 10*3/uL (ref 4.0–10.5)

## 2017-08-25 LAB — CBG MONITORING, ED
Glucose-Capillary: 233 mg/dL — ABNORMAL HIGH (ref 70–99)
Glucose-Capillary: 204 mg/dL — ABNORMAL HIGH (ref 70–99)

## 2017-08-25 LAB — TROPONIN I: Troponin I: <0.03 ng/mL (ref <0.03)

## 2017-08-25 LAB — LIPID PANEL
Cholesterol: 182 mg/dL (ref 0–200)
HDL: 32 mg/dL — ABNORMAL LOW (ref >40)
LDL Cholesterol: 126 mg/dL — ABNORMAL HIGH (ref 0–99)
Total CHOL/HDL Ratio: 5.7 RATIO
Triglycerides: 118 mg/dL (ref <150)
VLDL: 24 mg/dL (ref 0–40)

## 2017-08-25 LAB — CREATININE, SERUM
Creatinine, Ser: 14.27 mg/dL — ABNORMAL HIGH (ref 0.44–1.00)
GFR calc Af Amer: 3 mL/min — ABNORMAL LOW (ref >60)
GFR calc non Af Amer: 3 mL/min — ABNORMAL LOW (ref >60)

## 2017-08-25 LAB — GLUCOSE, CAPILLARY
Glucose-Capillary: 183 mg/dL — ABNORMAL HIGH (ref 70–99)
Glucose-Capillary: 137 mg/dL — ABNORMAL HIGH (ref 70–99)
Glucose-Capillary: 185 mg/dL — ABNORMAL HIGH (ref 70–99)

## 2017-08-25 LAB — HEMOGLOBIN A1C
Hgb A1c MFr Bld: 7.8 % — ABNORMAL HIGH (ref 4.8–5.6)
Mean Plasma Glucose: 177.16 mg/dL

## 2017-08-25 LAB — AMYLASE: Amylase: 92 U/L (ref 28–100)

## 2017-08-25 MED ORDER — HEPARIN SODIUM (PORCINE) 5000 UNIT/ML IJ SOLN
5000.0000 [IU] | Freq: Three times a day (TID) | INTRAMUSCULAR | Status: DC
  Administered 2017-08-25 – 2017-08-28 (×9): 5000 [IU] via SUBCUTANEOUS
  Filled 2017-08-25 (×9): qty 1

## 2017-08-25 MED ORDER — SODIUM CHLORIDE 0.9 % IV SOLN
500.0000 mg | INTRAVENOUS | Status: DC
  Administered 2017-08-25 – 2017-08-26 (×2): 500 mg via INTRAVENOUS
  Filled 2017-08-25 (×3): qty 0.5

## 2017-08-25 MED ORDER — CARVEDILOL 6.25 MG PO TABS
6.2500 mg | ORAL_TABLET | Freq: Two times a day (BID) | ORAL | Status: DC
  Administered 2017-08-25 – 2017-08-28 (×7): 6.25 mg via ORAL
  Filled 2017-08-25 (×7): qty 1

## 2017-08-25 MED ORDER — ONDANSETRON HCL 4 MG/2ML IJ SOLN
4.0000 mg | Freq: Four times a day (QID) | INTRAMUSCULAR | Status: DC | PRN
  Administered 2017-08-25 – 2017-08-27 (×4): 4 mg via INTRAVENOUS
  Filled 2017-08-25 (×4): qty 2

## 2017-08-25 MED ORDER — DELFLEX-LC/1.5% DEXTROSE 344 MOSM/L IP SOLN
INTRAPERITONEAL | Status: DC
  Administered 2017-08-25: 5000 mL via INTRAPERITONEAL

## 2017-08-25 MED ORDER — HEPARIN 1000 UNIT/ML FOR PERITONEAL DIALYSIS: 500.0000 [IU] | INTRAMUSCULAR | Status: DC | PRN

## 2017-08-25 MED ORDER — ONDANSETRON HCL 4 MG PO TABS: 4.0000 mg | ORAL_TABLET | Freq: Four times a day (QID) | ORAL | Status: DC | PRN

## 2017-08-25 MED ORDER — IRBESARTAN 300 MG PO TABS
300.0000 mg | ORAL_TABLET | Freq: Every day | ORAL | Status: DC
  Administered 2017-08-25 – 2017-08-28 (×4): 300 mg via ORAL
  Filled 2017-08-25 (×4): qty 1

## 2017-08-25 MED ORDER — SODIUM CHLORIDE 0.9 % IV SOLN
1.0000 g | Freq: Once | INTRAVENOUS | Status: AC
  Administered 2017-08-25: 1 g via INTRAVENOUS
  Filled 2017-08-25: qty 1

## 2017-08-25 MED ORDER — HEPARIN 1000 UNIT/ML FOR PERITONEAL DIALYSIS
INTRAPERITONEAL | Status: DC | PRN
  Filled 2017-08-25 (×2): qty 3000

## 2017-08-25 MED ORDER — INSULIN ASPART 100 UNIT/ML ~~LOC~~ SOLN
0.0000 [IU] | SUBCUTANEOUS | Status: DC
  Administered 2017-08-25: 2 [IU] via SUBCUTANEOUS
  Administered 2017-08-25: 3 [IU] via SUBCUTANEOUS
  Administered 2017-08-25: 1 [IU] via SUBCUTANEOUS
  Administered 2017-08-25 – 2017-08-26 (×2): 3 [IU] via SUBCUTANEOUS
  Administered 2017-08-26: 1 [IU] via SUBCUTANEOUS
  Administered 2017-08-26: 2 [IU] via SUBCUTANEOUS
  Administered 2017-08-26: 1 [IU] via SUBCUTANEOUS
  Administered 2017-08-26 – 2017-08-27 (×3): 2 [IU] via SUBCUTANEOUS
  Administered 2017-08-27: 1 [IU] via SUBCUTANEOUS
  Administered 2017-08-27: 3 [IU] via SUBCUTANEOUS
  Administered 2017-08-27 (×2): 2 [IU] via SUBCUTANEOUS
  Administered 2017-08-27: 1 [IU] via SUBCUTANEOUS
  Administered 2017-08-28 (×3): 2 [IU] via SUBCUTANEOUS
  Administered 2017-08-28: 3 [IU] via SUBCUTANEOUS
  Filled 2017-08-25 (×2): qty 1

## 2017-08-25 MED ORDER — RENA-VITE PO TABS
1.0000 | ORAL_TABLET | Freq: Every day | ORAL | Status: DC
  Administered 2017-08-25 – 2017-08-27 (×3): 1 via ORAL
  Filled 2017-08-25 (×3): qty 1

## 2017-08-25 MED ORDER — VITAMIN B-1 100 MG PO TABS
100.0000 mg | ORAL_TABLET | Freq: Every day | ORAL | Status: DC
  Administered 2017-08-25 – 2017-08-28 (×4): 100 mg via ORAL
  Filled 2017-08-25 (×4): qty 1

## 2017-08-25 MED ORDER — ACETAMINOPHEN 325 MG PO TABS
650.0000 mg | ORAL_TABLET | Freq: Four times a day (QID) | ORAL | Status: DC | PRN
  Administered 2017-08-26 – 2017-08-28 (×2): 650 mg via ORAL
  Filled 2017-08-25 (×2): qty 2

## 2017-08-25 MED ORDER — FOLIC ACID 1 MG PO TABS
1.0000 mg | ORAL_TABLET | Freq: Every day | ORAL | Status: DC
  Administered 2017-08-25 – 2017-08-28 (×4): 1 mg via ORAL
  Filled 2017-08-25 (×4): qty 1

## 2017-08-25 MED ORDER — IOHEXOL 300 MG/ML  SOLN
100.0000 mL | Freq: Once | INTRAMUSCULAR | Status: AC | PRN
  Administered 2017-08-25: 100 mL via INTRAVENOUS

## 2017-08-25 MED ORDER — MORPHINE SULFATE (PF) 2 MG/ML IV SOLN
1.0000 mg | INTRAVENOUS | Status: DC | PRN
  Administered 2017-08-27: 1 mg via INTRAVENOUS
  Filled 2017-08-25: qty 1

## 2017-08-25 MED ORDER — ACETAMINOPHEN 650 MG RE SUPP: 650.0000 mg | Freq: Four times a day (QID) | RECTAL | Status: DC | PRN

## 2017-08-25 MED ORDER — CALCIUM ACETATE (PHOS BINDER) 667 MG PO CAPS
1334.0000 mg | ORAL_CAPSULE | Freq: Three times a day (TID) | ORAL | Status: DC
  Administered 2017-08-25 – 2017-08-28 (×10): 1334 mg via ORAL
  Filled 2017-08-25 (×11): qty 2

## 2017-08-25 MED ORDER — GENTAMICIN SULFATE 0.1 % EX CREA
1.0000 "application " | TOPICAL_CREAM | Freq: Every day | CUTANEOUS | Status: DC
  Administered 2017-08-25 – 2017-08-27 (×3): 1 via TOPICAL
  Filled 2017-08-25 (×2): qty 15

## 2017-08-25 NOTE — ED Notes (Signed)
Patient CBG was 204.

## 2017-08-25 NOTE — Plan of Care (Signed)
Pt has acute pain but not requiring medication

## 2017-08-25 NOTE — Plan of Care (Signed)
No medication requested at this time

## 2017-08-25 NOTE — ED Notes (Signed)
Patient was given a cup of water and a Sprite with a Cup of Ice.

## 2017-08-25 NOTE — ED Notes (Signed)
Pt c/o nausea.  PRN zofran given.  Denies pain at this time.

## 2017-08-25 NOTE — Addendum Note (Signed)
Addended by: Karle Barr on: 08/25/2017 09:18 AM   Modules accepted: Orders

## 2017-08-25 NOTE — Progress Notes (Addendum)
Patient seen and examined Continues to be nauseous, symptoms and dominant and an ongoing for 2 weeks Patient states that she was never a diabetic and  Suddenly her sugar was greater than 400   52 y.o. female with history of ESRD on peritoneal dialysis, hypertension diabetes mellitus type 2 was referred to the ER by patient's primary care physician after patient labs were found to be abnormal.  Patient states she has been having abdominal pain mostly in the epigastric area with multiple episodes of nausea vomiting and diarrhea over the last week.   CT abdomen pelvis shows fat-containing area of inflammation  Now admitted for acute pancreatitis Nephrology, Dr. Jonnie Finner notify about the admission  , patient is on peritoneal dialysis at home CT also concerning for omental infarction therefore Gen. surgery consulted

## 2017-08-25 NOTE — Progress Notes (Signed)
Pt is deaf in left ear.

## 2017-08-25 NOTE — Consult Note (Signed)
Renal Service Consult Note [facility]  Brenda Davis 08/25/2017 Brenda Davis Requesting Physician:  Dr Hal Hope  Reason for Consult:  ESRD pt on PD w/ abd pain and ^lipase HPI: The patient is a 52 y.o. year-old with hx of DM2, morbid obesity, HTN, ESRD on PD.  Patient presented w/ abd pain , ^lipase.  CT showed normal pancreas, omental infarction in R mid-ant abdomen.  Pt admitted for acute pancreatitis. Asked to see for esrd.    Pt started HD in Dec 2016.  She had PD cath put in Dec 2018 and is now on PD doing "much better".  She was having lots of symptoms on HD and fatigue.    Pt had gastric sleeve done in 2014 and lost weight and came off of DM meds.  She gained wt back but hasn't needed DM meds.  Recent labs showed BS 130- 150 range but here today / last night BS's are up 300- 400 range, not taking any diabetic meds at home at this time.     ROS  denies CP  no joint pain   no HA  no blurry vision  no rash    Past Medical History  Past Medical History:  Diagnosis Date  . Anemia   . Chronic kidney disease    STAGE 3 CHRONIC KIDNEY DISEASE SECONDARY TO DIABETIC GLOMERULOSCLEROSIS AND UNCONTROLLED HYPERTENSION - PER OFFICE NOTES DR. Florene [facility].  Marland Kitchen ESRD on dialysis [facility])    "MWF; [address] (05/06/2016)  . Gout   . HCAP (healthcare-associated pneumonia) 05/06/2016  . History of blood transfusion    "related to surgery"  . Hyperlipidemia   . Hypertension   . Morbid obesity ([place])   . Pain    LEFT SHOULDER PAIN - WAS SEEN AT AN URGENT CARE - GIVEN SLING FOR COMFORT AND TOLD ROM AS TOLERATED.  Marland Kitchen Palpitations 09/24/2016  . Type II diabetes mellitus ([place])    "gastric sleeve OR corrected this" (05/06/2016)   Past Surgical History  Past Surgical History:  Procedure Laterality Date  . AV FISTULA PLACEMENT Left 01/03/2015   Procedure: BRACHIAL CEPHALIC ARTERIOVENOUS  FISTULA CREATION LEFT ARM;  Surgeon: Angelia Mould,  MD;  Location: [place];  Service: Vascular;  Laterality: Left;  . CARPAL TUNNEL RELEASE Right   . [place]  . ESOPHAGOGASTRODUODENOSCOPY N/A 09/04/2012   Procedure: ESOPHAGOGASTRODUODENOSCOPY (EGD);  Surgeon: Shann Medal, MD;  Location: [facility];  Service: General;  Laterality: N/A;  PF  . ESOPHAGOGASTRODUODENOSCOPY (EGD) WITH ESOPHAGEAL DILATION N/A 09/29/2012   Procedure: ESOPHAGOGASTRODUODENOSCOPY (EGD) WITH ESOPHAGEAL DILATION;  Surgeon: Milus Banister, MD;  Location: [facility];  Service: Endoscopy;  Laterality: N/A;  . INSERTION OF DIALYSIS CATHETER N/A 01/03/2015   Procedure: INSERTION OF DIALYSIS CATHETER RIGHT INTERNAL JUGULAR;  Surgeon: Angelia Mould, MD;  Location: [place];  Service: Vascular;  Laterality: N/A;  . LAPAROSCOPIC GASTRIC SLEEVE RESECTION N/A 07/19/2012   Procedure: LAPAROSCOPIC SLEEVE GASTRECTOMY with EGD;  Surgeon: Madilyn Hook, DO;  Location: [facility];  Service: General;  Laterality: N/A;  laparoscopic sleeve gastrectomy with EGD  . PERIPHERAL VASCULAR CATHETERIZATION Left 05/23/2015   Procedure: Nolon Stalls;  Surgeon: Conrad Megargel, MD;  Location: [place] [facility];  Service: Cardiovascular;  Laterality: Left;  upper aRM  . PERIPHERAL VASCULAR CATHETERIZATION Left 05/23/2015   Procedure: Peripheral Vascular Balloon Angioplasty;  Surgeon: Conrad Rewey, MD;  Location: [place] [facility];  Service: Cardiovascular;  Laterality: Left;  av fistula  . TUBAL LIGATION  1993  . UPPER GI ENDOSCOPY N/A 07/19/2012   Procedure: UPPER GI ENDOSCOPY;  Surgeon: Madilyn Hook, DO;  Location: [facility];  Service: General;  Laterality: N/A;   Family History  Family History  Problem Relation Age of Onset  . Hypertension Mother   . Mitral valve prolapse Mother   . Diabetes Father   . Arthritis Other   . Hyperlipidemia Other   . Cancer Neg Hx   . Heart disease Neg Hx   . Kidney disease Neg Hx   . Stroke Neg Hx    Social History  reports that she quit smoking  about 10 years ago. Her smoking use included cigarettes. She has a 16.00 pack-year smoking history. She has never used smokeless tobacco. She reports that she does not drink alcohol or use drugs. Allergies  Allergies  Allergen Reactions  . Amlodipine Swelling    edema  . Clonidine Derivatives Swelling    Limbs swell  . Welchol [Colesevelam Hcl] Nausea Only   Home medications Prior to Admission medications   Medication Sig Start Date End Date Taking? Authorizing Provider  acetaminophen (TYLENOL) 500 MG tablet Take 1,000 mg by mouth every 6 (six) hours as needed (for pain).   Yes [provider]  AURYXIA 1 GM 210 MG(Fe) tablet TAKE 1 TABLET BY MOUTH THREE TIMES A DAY WITH MEALS.   SWALLOW WHOLE, DO NOT CHEW OR CRUSH MEDICATION 04/07/17  Yes [provider]  calcium acetate (PHOSLO) 667 MG capsule Take 2 capsules (1,334 mg total) by mouth 3 (three) times daily with meals. 01/07/15  Yes Eugenie Filler, MD  carvedilol (COREG) 6.25 MG tablet Take 6.25 mg by mouth 2 (two) times daily with a meal. 07/07/17  Yes [provider]  cyclobenzaprine (FLEXERIL) 10 MG tablet Take 0.5-1 tablets (5-10 mg total) by mouth 2 (two) times daily as needed for muscle spasms. 05/21/17  Yes Harris, Abigail, PA-C  folic acid (FOLVITE) 1 MG tablet Take 1 tablet (1 mg total) by mouth daily. 06/21/17  Yes [name], MD  gentamicin cream (GARAMYCIN) 0.1 % APPLY TO EXIT SITE DAILY AS DIRECTED 04/26/17  Yes [provider]  heparin 1000 UNIT/ML injection ADMINISTER AS DIRECTED 04/26/17  Yes [provider]  irbesartan (AVAPRO) 300 MG tablet TAKE 1 TABLET BY MOUTH EVERYDAY AT BEDTIME 06/16/17  Yes [name], MD  multivitamin (RENA-VIT) TABS tablet Take 1 tablet by mouth at bedtime. 01/07/15  Yes Eugenie Filler, MD  polyethylene glycol [facility]) packet Take 17 g by mouth daily as needed. Patient taking differently: Take 17 g by mouth daily as needed for mild  constipation.  05/09/16  Yes Eugenie Filler, MD  thiamine (VITAMIN B-1) 100 MG tablet Take 1 tablet (100 mg total) by mouth daily. 06/21/17  Yes [name], MD   Liver Function Tests Recent Labs  Lab 08/24/17 1201 08/24/17 1719  AST 8 12*  ALT 10 14  ALKPHOS 76 80  BILITOT 0.4 0.8  PROT 7.1 6.6  ALBUMIN 3.7 3.2*   Recent Labs  Lab 08/24/17 1201 08/24/17 1719  LIPASE 884.0* 682*  AMYLASE 59  --    CBC Recent Labs  Lab 08/24/17 1201 08/24/17 1719 08/25/17 0608  WBC 8.3 9.8 8.3  NEUTROABS 6.1  --   --   HGB 10.3* 10.0* 9.5*  HCT 31.3* 31.6* 30.3*  MCV 98.4 100.3* 100.7*  PLT 322.0 337 279   Basic  Metabolic Panel Recent Labs  Lab 08/24/17 1201 08/24/17 1719 08/25/17 0608  NA 132* 136  --   K 4.6 4.4  --   CL 92* 96*  --   CO2 27 26  --   GLUCOSE 444* 394*  --   BUN 59* 60*  --   CREATININE 13.56* 14.37* 14.27*  CALCIUM 9.2 8.9  --    Iron/TIBC/Ferritin/ %Sat    Component Value Date/Time   IRON 41 (L) 08/24/2017 1201   TIBC 290 01/03/2015 1650   FERRITIN 946.3 (H) 08/24/2017 1201   IRONPCTSAT 18.1 (L) 08/24/2017 1201    Vitals:   08/25/17 0945 08/25/17 1015 08/25/17 1045 08/25/17 1131  BP: 126/62 (!) 152/79 (!) 144/68 131/87  Pulse: 71 74 65 66  Resp: 14 16 19 18   Temp:    98.2 F (36.8 C)  TempSrc:    Oral  SpO2: 96% 100% 100% 100%   Exam Gen alert, obese, no distress No rash, cyanosis or gangrene Sclera anicteric, throat clear  No jvd or bruits Chest clear bilat RRR no MRG Abd soft some mid-abd tenderness, no rebound, PD cath mid-abdomen, clean exit site, no mass or ascites +bs obese GU defer MS no joint effusions or deformity Ext no LE edema, no wounds or ulcers Neuro is alert, Ox 3 , nf LUA AVF+ bruit    Home meds:  - coreg 6.25 bid/ irbesartan 300 qd  - phoslo ac/ auryxia q tid ac  - prn's/ vitamins  Dialysis: [facility]   130kg  5 exchanges 2500 fill, 1.5 hr dwell, last fill O, no day exchanges, heparin prn for fibrin - last  Hb 9.4, tsat 30%, ferr 1329     Impression: 1  Abdominal pain / [place] Dura - has omental infarction in the anterior abdomen per CT scan.  Wonder if the fatty omental tissue can cause ^lipase when infarcted.  Pancreas is normal in appearance by CT.  Have d/w primary team, they will call gen surgery to see.  2  ESRD on [facility] 3  Volume - at dry wt 4  Obesity 5  Uncont DM - not on home meds, BS's up possibly due to acute illness 6  HTN - cont meds   Plan - as above  Kelly Splinter MD [employer] pager [phone]   08/25/2017, 12:22 PM

## 2017-08-25 NOTE — Progress Notes (Signed)
.  Pharmacy Antibiotic Note  Brenda Davis is a 52 y.o. female with ESRD on HD admitted on 08/24/2017 with pancreatitis.  Pharmacy has been consulted for Meropenem dosing.  Plan: Meropenem 1 g IV now, then 500 mg IV q24h    Temp (24hrs), Avg:98.2 F (36.8 C), Min:98.1 F (36.7 C), Max:98.3 F (36.8 C)  Recent Labs  Lab 08/24/17 1201 08/24/17 1719  WBC 8.3 9.8  CREATININE 13.56* 14.37*    Estimated Creatinine Clearance: 6.3 mL/min (A) (by C-G formula based on SCr of 14.37 mg/dL (H)).    Allergies  Allergen Reactions  . Amlodipine Swelling    edema  . Clonidine Derivatives Swelling    Limbs swell  . Welchol [Colesevelam Hcl] Nausea Only    Caryl Pina 08/25/2017 5:15 AM

## 2017-08-25 NOTE — Progress Notes (Signed)
Paged Dr. Allyson Sabal.  Pt needs nephrology consult for PD orders, missed last night.  Tranferred to 61m03, FYI.

## 2017-08-25 NOTE — ED Notes (Signed)
Dr Allyson Sabal paged RE peritoneal dialysis.

## 2017-08-25 NOTE — Progress Notes (Signed)
Pt transferred to room 5M03 from 5C08 via bed.  Pt alert and oriented X 4.  VSS.  Denies pain at this time.  Pt received zofran for nausea.  VSS.  Family at bedside.

## 2017-08-25 NOTE — Progress Notes (Signed)
Inpatient Diabetes Program Recommendations  AACE/ADA: New Consensus Statement on Inpatient Glycemic Control (2015)  Target Ranges:  Prepandial:   less than 140 mg/dL      Peak postprandial:   less than 180 mg/dL (1-2 hours)      Critically ill patients:  140 - 180 mg/dL   Review of Glycemic Control  Diabetes history: DM 2 Outpatient Diabetes medications: None, Gastric sleeve sx 2014 Current orders for Inpatient glycemic control: Novolog 0-9 units Q4 hours  Inpatient Diabetes Program Recommendations:    A1c from 07/27/17 was 6.8%. Current A1c pending. Noted admitting glucose in the 400's. Also noted Lipase elevated. Will watch on current regimen and follow patient.  Thanks,  Tama Headings RN, MSN, BC-ADM Inpatient Diabetes Coordinator Team Pager [phone] (8a-5p)

## 2017-08-25 NOTE — H&P (Addendum)
History and Physical    BURNETTA KOHLS ALP:[id number] DOB: 1966-01-06 DOA: 08/24/2017  PCP: [name], MD  Patient coming from: Home.  Chief Complaint: Abdominal pain.  HPI: Brenda Davis is a 52 y.o. female with history of ESRD on peritoneal dialysis, hypertension diabetes mellitus type 2 was referred to the ER by patient's primary care physician after patient labs were found to be abnormal.  Patient states she has been having abdominal pain mostly in the epigastric area with multiple episodes of nausea vomiting and diarrhea over the last week.  Unable to keep in anything.  Pain is episodic.  Denies any chest pain shortness of breath or any fever or chills.  Patient states her dialysate fluid is clear.  Patient did go to a PCP and had labs done which showed elevated glucose and lipase and was referred to the ER.  ED Course: CT of the abdomen pelvis done in the ER shows omental infarction otherwise unremarkable pancreas.  UA shows features concerning for UTI.  Repeat labs again demonstrate elevated lipase.  Patient admitted for acute abdominal pain with persistent vomiting and diarrhea unable to keep in anything.  Review of Systems: As per HPI, rest all negative.   Past Medical History:  Diagnosis Date  . Anemia   . Chronic kidney disease    STAGE 3 CHRONIC KIDNEY DISEASE SECONDARY TO DIABETIC GLOMERULOSCLEROSIS AND UNCONTROLLED HYPERTENSION - PER OFFICE NOTES DR. [facility].  Marland Kitchen ESRD on dialysis [facility])    "MWF; [address] (05/06/2016)  . Gout   . HCAP (healthcare-associated pneumonia) 05/06/2016  . History of blood transfusion    "related to surgery"  . Hyperlipidemia   . Hypertension   . Morbid obesity ([place])   . Pain    LEFT SHOULDER PAIN - WAS SEEN AT AN URGENT CARE - GIVEN SLING FOR COMFORT AND TOLD ROM AS TOLERATED.  Marland Kitchen Palpitations 09/24/2016  . Type II diabetes mellitus ([place])    "gastric sleeve OR corrected this" (05/06/2016)     Past Surgical History:  Procedure Laterality Date  . AV FISTULA PLACEMENT Left 01/03/2015   Procedure: BRACHIAL CEPHALIC ARTERIOVENOUS  FISTULA CREATION LEFT ARM;  Surgeon: Angelia Mould, MD;  Location: [place];  Service: Vascular;  Laterality: Left;  . CARPAL TUNNEL RELEASE Right   . [place]  . ESOPHAGOGASTRODUODENOSCOPY N/A 09/04/2012   Procedure: ESOPHAGOGASTRODUODENOSCOPY (EGD);  Surgeon: Shann Medal, MD;  Location: [facility];  Service: General;  Laterality: N/A;  PF  . ESOPHAGOGASTRODUODENOSCOPY (EGD) WITH ESOPHAGEAL DILATION N/A 09/29/2012   Procedure: ESOPHAGOGASTRODUODENOSCOPY (EGD) WITH ESOPHAGEAL DILATION;  Surgeon: Milus Banister, MD;  Location: [facility];  Service: Endoscopy;  Laterality: N/A;  . INSERTION OF DIALYSIS CATHETER N/A 01/03/2015   Procedure: INSERTION OF DIALYSIS CATHETER RIGHT INTERNAL JUGULAR;  Surgeon: Angelia Mould, MD;  Location: [place];  Service: Vascular;  Laterality: N/A;  . LAPAROSCOPIC GASTRIC SLEEVE RESECTION N/A 07/19/2012   Procedure: LAPAROSCOPIC SLEEVE GASTRECTOMY with EGD;  Surgeon: Madilyn Hook, DO;  Location: [facility];  Service: General;  Laterality: N/A;  laparoscopic sleeve gastrectomy with EGD  . PERIPHERAL VASCULAR CATHETERIZATION Left 05/23/2015   Procedure: Nolon Stalls;  Surgeon: Conrad Rockford, MD;  Location: [facility];  Service: Cardiovascular;  Laterality: Left;  upper aRM  . PERIPHERAL VASCULAR CATHETERIZATION Left 05/23/2015   Procedure: Peripheral Vascular Balloon Angioplasty;  Surgeon: Conrad , MD;  Location: [place] [facility];  Service: Cardiovascular;  Laterality:  Left;  av fistula  . TUBAL LIGATION  1993  . UPPER GI ENDOSCOPY N/A 07/19/2012   Procedure: UPPER GI ENDOSCOPY;  Surgeon: Madilyn Hook, DO;  Location: [facility];  Service: General;  Laterality: N/A;     reports that she quit smoking about 10 years ago. Her smoking use included cigarettes. She has a 16.00 pack-year smoking  history. She has never used smokeless tobacco. She reports that she does not drink alcohol or use drugs.  Allergies  Allergen Reactions  . Amlodipine Swelling    edema  . Clonidine Derivatives Swelling    Limbs swell  . Welchol [Colesevelam Hcl] Nausea Only    Family History  Problem Relation Age of Onset  . Hypertension Mother   . Mitral valve prolapse Mother   . Diabetes Father   . Arthritis Other   . Hyperlipidemia Other   . Cancer Neg Hx   . Heart disease Neg Hx   . Kidney disease Neg Hx   . Stroke Neg Hx     Prior to Admission medications   Medication Sig Start Date End Date Taking? Authorizing Provider  acetaminophen (TYLENOL) 500 MG tablet Take 1,000 mg by mouth every 6 (six) hours as needed (for pain).   Yes [provider]  AURYXIA 1 GM 210 MG(Fe) tablet TAKE 1 TABLET BY MOUTH THREE TIMES A DAY WITH MEALS.   SWALLOW WHOLE, DO NOT CHEW OR CRUSH MEDICATION 04/07/17  Yes [provider]  calcium acetate (PHOSLO) 667 MG capsule Take 2 capsules (1,334 mg total) by mouth 3 (three) times daily with meals. 01/07/15  Yes Eugenie Filler, MD  carvedilol (COREG) 6.25 MG tablet Take 6.25 mg by mouth 2 (two) times daily with a meal. 07/07/17  Yes [provider]  cyclobenzaprine (FLEXERIL) 10 MG tablet Take 0.5-1 tablets (5-10 mg total) by mouth 2 (two) times daily as needed for muscle spasms. 05/21/17  Yes Harris, Abigail, PA-C  folic acid (FOLVITE) 1 MG tablet Take 1 tablet (1 mg total) by mouth daily. 06/21/17  Yes [name], MD  gentamicin cream (GARAMYCIN) 0.1 % APPLY TO EXIT SITE DAILY AS DIRECTED 04/26/17  Yes [provider]  heparin 1000 UNIT/ML injection ADMINISTER AS DIRECTED 04/26/17  Yes [provider]  irbesartan (AVAPRO) 300 MG tablet TAKE 1 TABLET BY MOUTH EVERYDAY AT BEDTIME 06/16/17  Yes [name], MD  multivitamin (RENA-VIT) TABS tablet Take 1 tablet by mouth at bedtime. 01/07/15  Yes Eugenie Filler, MD   polyethylene glycol [facility] Floria Raveling) packet Take 17 g by mouth daily as needed. Patient taking differently: Take 17 g by mouth daily as needed for mild constipation.  05/09/16  Yes Eugenie Filler, MD  thiamine (VITAMIN B-1) 100 MG tablet Take 1 tablet (100 mg total) by mouth daily. 06/21/17  Yes [name], MD  clindamycin (CLEOCIN) 300 MG capsule Take 1 capsule (300 mg total) by mouth 3 (three) times daily. Patient not taking: Reported on 08/24/2017 07/15/17   Binnie Rail, MD    Physical Exam: Vitals:   08/25/17 0030 08/25/17 0045 08/25/17 0115 08/25/17 0407  BP: (!) 156/89 138/83 (!) 148/97 135/85  Pulse: 77 74 73 81  Resp:    19  Temp:      TempSrc:      SpO2: 96% 93% 100% 100%      Constitutional: Moderately built and nourished. Vitals:   08/25/17 0030 08/25/17 0045 08/25/17 0115 08/25/17 0407  BP: (!) 156/89  138/83 (!) 148/97 135/85  Pulse: 77 74 73 81  Resp:    19  Temp:      TempSrc:      SpO2: 96% 93% 100% 100%   Eyes: Anicteric no pallor. ENMT: No discharge from the ears eyes nose or mouth. Neck: No mass palpated no neck rigidity. Respiratory: No rhonchi or crepitations. Cardiovascular: S1-S2 heard no murmurs appreciated. Abdomen: Soft nontender bowel sounds present.  Mildly distended no guarding or rigidity.  No rebound tenderness Musculoskeletal: No edema. Skin: No rash. Neurologic: Alert awake oriented to time place person.  Moves all extremities. Psychiatric: Appears normal.  Normal affect.   Labs on Admission: I have personally reviewed following labs and imaging studies  CBC: Recent Labs  Lab 08/24/17 1201 08/24/17 1719  WBC 8.3 9.8  NEUTROABS 6.1  --   HGB 10.3* 10.0*  HCT 31.3* 31.6*  MCV 98.4 100.3*  PLT 322.0 023   Basic Metabolic Panel: Recent Labs  Lab 08/24/17 1201 08/24/17 1719  NA 132* 136  K 4.6 4.4  CL 92* 96*  CO2 27 26  GLUCOSE 444* 394*  BUN 59* 60*  CREATININE 13.56* 14.37*  CALCIUM 9.2 8.9    GFR: Estimated Creatinine Clearance: 6.3 mL/min (A) (by C-G formula based on SCr of 14.37 mg/dL (H)). Liver Function Tests: Recent Labs  Lab 08/24/17 1201 08/24/17 1719  AST 8 12*  ALT 10 14  ALKPHOS 76 80  BILITOT 0.4 0.8  PROT 7.1 6.6  ALBUMIN 3.7 3.2*   Recent Labs  Lab 08/24/17 1201 08/24/17 1719  LIPASE 884.0* 682*  AMYLASE 59  --    No results for input(s): AMMONIA in the last 168 hours. Coagulation Profile: No results for input(s): INR, PROTIME in the last 168 hours. Cardiac Enzymes: No results for input(s): CKTOTAL, CKMB, CKMBINDEX, TROPONINI in the last 168 hours. BNP (last 3 results) No results for input(s): PROBNP in the last 8760 hours. HbA1C: No results for input(s): HGBA1C in the last 72 hours. CBG: Recent Labs  Lab 08/24/17 1714  GLUCAP 364*   Lipid Profile: No results for input(s): CHOL, HDL, LDLCALC, TRIG, CHOLHDL, LDLDIRECT in the last 72 hours. Thyroid Function Tests: No results for input(s): TSH, T4TOTAL, FREET4, T3FREE, THYROIDAB in the last 72 hours. Anemia Panel: Recent Labs    08/24/17 1201  VITAMINB12 663  FOLATE 10.3  FERRITIN 946.3*  IRON 41*   Urine analysis:    Component Value Date/Time   COLORURINE YELLOW 08/24/2017 1718   APPEARANCEUR TURBID (A) 08/24/2017 1718   LABSPEC 1.012 08/24/2017 1718   PHURINE 7.0 08/24/2017 1718   GLUCOSEU >=500 (A) 08/24/2017 1718   GLUCOSEU 100 (A) 12/31/2014 1510   HGBUR SMALL (A) 08/24/2017 1718   HGBUR negative 09/17/2009 0000   BILIRUBINUR NEGATIVE 08/24/2017 1718   KETONESUR NEGATIVE 08/24/2017 1718   PROTEINUR 100 (A) 08/24/2017 1718   UROBILINOGEN 0.2 12/31/2014 1510   NITRITE NEGATIVE 08/24/2017 1718   LEUKOCYTESUR LARGE (A) 08/24/2017 1718   Sepsis Labs: @LABRCNTIP (procalcitonin:4,lacticidven:4) )No results found for this or any previous visit (from the past 240 hour(s)).   Radiological Exams on Admission: Ct Abdomen Pelvis W Contrast  Result Date: 08/25/2017 CLINICAL  DATA:  Crampy abdominal pain. EXAM: CT ABDOMEN AND PELVIS WITH CONTRAST TECHNIQUE: Multidetector CT imaging of the abdomen and pelvis was performed using the standard protocol following bolus administration of intravenous contrast. CONTRAST:  171m OMNIPAQUE IOHEXOL 300 MG/ML  SOLN COMPARISON:  CT abdomen pelvis 02/05/2015 FINDINGS: LOWER CHEST: No basilar  pulmonary nodules or pleural effusion. No apical pericardial effusion. HEPATOBILIARY: Normal hepatic contours and density. No intra- or extrahepatic biliary dilatation. Normal gallbladder. PANCREAS: Normal parenchymal contours without ductal dilatation. No peripancreatic fluid collection. SPLEEN: Normal. ADRENALS/URINARY TRACT: --Adrenal glands: Normal. --Right kidney/ureter: Atrophic kidney --Left kidney/ureter: The atrophic kidney --Urinary bladder: Normal for degree of distention. There is a peritoneal dialysis catheter coiled between the uterus and urinary bladder. STOMACH/BOWEL: --Stomach/Duodenum: Postsurgical changes of the stomach. Normal duodenal course. --Small bowel: No dilatation or inflammation. --Colon: No focal abnormality. --Appendix: Normal. VASCULAR/LYMPHATIC: Atherosclerotic calcification is present within the non-aneurysmal abdominal aorta, without hemodynamically significant stenosis. The portal vein, splenic vein, superior mesenteric vein and IVC are patent. No abdominal or pelvic lymphadenopathy. REPRODUCTIVE: Normal uterus.  No adnexal mass. MUSCULOSKELETAL. No bony spinal canal stenosis or focal osseous abnormality. OTHER: Inflammatory stranding surrounding a fat-containing focus in the right upper quadrant. IMPRESSION: 1. Fat-containing area of inflammatory stranding in the right upper quadrant is most consistent with omental infarction. 2. No pancreatic abnormality. 3.  Aortic Atherosclerosis (ICD10-I70.0). Electronically Signed   By: Ulyses Jarred M.D.   On: 08/25/2017 02:38   Dg Abd Acute W/chest  Result Date: 08/24/2017 CLINICAL  DATA:  Diarrhea and generalized stomach pain, nausea, and vomiting for 2 weeks, former smoker, history hypertension, end-stage renal disease on dialysis, type II diabetes mellitus EXAM: DG ABDOMEN ACUTE W/ 1V CHEST COMPARISON:  Chest radiographs 05/21/2017, CT abdomen and pelvis 02/05/2015 FINDINGS: Upper normal heart size. Mediastinal contours and pulmonary vascularity normal. Atherosclerotic calcification at aorta Lungs clear. No pulmonary infiltrate, pleural effusion or pneumothorax. Bones demineralized. Surgical clips/staples from history of gastric sleeve resection. Peritoneal dialysis catheter. Bowel gas pattern normal. No bowel dilatation bowel wall thickening, or free air. Osseous structures unremarkable. Calcification in LEFT mid abdomen is likely a tiny calcified lymph node by prior CT. IMPRESSION: Normal bowel gas pattern. Clear lungs. No acute abnormalities. Electronically Signed   By: Lavonia Dana M.D.   On: 08/24/2017 11:21      Assessment/Plan Principal Problem:   Acute pancreatitis Active Problems:   HYPERTENSION, BENIGN ESSENTIAL   Omental infarction ([place])   ESRD on peritoneal dialysis ([place])   DM (diabetes mellitus), type 2 with renal complications ([place])    1. Acute abdominal pain with persistent vomiting and diarrhea unable to keep in anything differentials include possible acute pancreatitis versus colitis from dialysis versus omental infarction seen in CAT scan.  Patient will be kept on clear liquids for now get dialysate fluid cultures cell count and Gram stain.  Will discuss with surgery about omental infarction.  Pain relief medications.  For now we will keep patient on empiric antibiotics. Addendum -discussed with on-call general surgeon Dr. Brantley Stage who at this time advised to closely observing pain relief for the omental infarct.  If pain does not get relieved then reconsult surgery.  Omental infarct has risk for infection and abscess.  2. Diabetes mellitus type 2 with  hyperglycemia -patient states since 2014 gastric sleeve surgery patient has been off diabetic medications.  Today blood sugars are more than 400 but not in DKA.  Will check hemoglobin A1c keep patient on sliding scale coverage and closely follow metabolic pattern. 3. Possible UTI on empiric antibiotics. 4. Hypertension on Coreg and irbesartan. 5. ESRD on peritoneal dialysis.  Notified nephrology in a.m. 6. Anemia likely from ESRD.  Follow CBC.  EKG cardiac markers and hemoglobin A1c are pending.     DVT prophylaxis: Heparin. Code Status: Full code. Family Communication: Discussed  with patient. Disposition Plan: Home. Consults called: None. Admission status: Inpatient.   Rise Patience MD [facility] Pager [phone].  If 7PM-7AM, please contact night-coverage [email or web address] Password [email or web address]  08/25/2017, 5:08 AM

## 2017-08-25 NOTE — Progress Notes (Signed)
[place] negative for cholelithiasis, likely not gallstone pancreatitis. Abdominal pain likely from omental infarction but this is not typically a surgical issue. CLD and advance diet as tolerated. Treat with anti-inflammatories, will leave it up to nephrology whether she could have NSAIDs. No acute surgical needs, we will sign off. Please call with questions or concerns.   Brigid Re , [facility] 08/25/2017, 4:37 PM Pager: [phone] Consults: [phone] Mon-Fri 7:00 am-4:30 pm Sat-Sun 7:00 am-11:30 am

## 2017-08-25 NOTE — ED Notes (Signed)
Pt placed on 2L [place] while sleeping d/t O2 sats of 88% while sleeping.  Appears in no respiratory distress.

## 2017-08-25 NOTE — Consult Note (Signed)
[facility] Surgery Consult Note  Brenda Davis 1965-08-05  [id number].    Requesting MD: Allyson Sabal Chief Complaint/Reason for Consult: abdominal pain  HPI:  Patient is a 52 year old female who was referred to [facility] by PCP after abnormal labs. She has been having abdominal pain intermittently for 2 weeks. Pain is mostly in epigastric area, twisting and sharp, worse with eating. Associated with multiple episodes of nausea/vomiting and diarrhea. Patient denies bloody stools or melena. Patient reports emesis not bloody. PMH significant for ESRD on peritoneal dialysis, has been stable with this. On admission lipase elevated to 680s, blood glucose in the 400 range. CT scan showed possible omental infarction. ROS as below. Patient denies alcohol use, only new medication is antihypertensive.   Prior abdominal surgeries: cesarean section x2 1988 and 1993, tubal ligation 1993, laparoscopic gastric sleeve resection 2014  ROS: Review of Systems  Constitutional: Positive for malaise/fatigue. Negative for chills and fever.  Respiratory: Positive for shortness of breath. Negative for wheezing.   Cardiovascular: Negative for chest pain and palpitations.  Gastrointestinal: Positive for abdominal pain, diarrhea, nausea and vomiting. Negative for blood in stool, constipation and melena.  Genitourinary: Positive for urgency. Negative for dysuria and frequency.  All other systems reviewed and are negative.   Family History  Problem Relation Age of Onset  . Hypertension Mother   . Mitral valve prolapse Mother   . Diabetes Father   . Arthritis Other   . Hyperlipidemia Other   . Cancer Neg Hx   . Heart disease Neg Hx   . Kidney disease Neg Hx   . Stroke Neg Hx     Past Medical History:  Diagnosis Date  . Anemia   . Chronic kidney disease    STAGE 3 CHRONIC KIDNEY DISEASE SECONDARY TO DIABETIC GLOMERULOSCLEROSIS AND UNCONTROLLED HYPERTENSION - PER OFFICE NOTES DR. Flore[facility].  Marland Kitchen ESRD on dialysis [facility])    "MWF; [address] (05/06/2016)  . Gout   . HCAP (healthcare-associated pneumonia) 05/06/2016  . History of blood transfusion    "related to surgery"  . Hyperlipidemia   . Hypertension   . Morbid obesity ([place])   . Pain    LEFT SHOULDER PAIN - WAS SEEN AT AN URGENT CARE - GIVEN SLING FOR COMFORT AND TOLD ROM AS TOLERATED.  Marland Kitchen Palpitations 09/24/2016  . Type II diabetes mellitus ([place])    "gastric sleeve OR corrected this" (05/06/2016)    Past Surgical History:  Procedure Laterality Date  . AV FISTULA PLACEMENT Left 01/03/2015   Procedure: BRACHIAL CEPHALIC ARTERIOVENOUS  FISTULA CREATION LEFT ARM;  Surgeon: Angelia Mould, MD;  Location: [facility];  Service: Vascular;  Laterality: Left;  . CARPAL TUNNEL RELEASE Right   . [place]  . ESOPHAGOGASTRODUODENOSCOPY N/A 09/04/2012   Procedure: ESOPHAGOGASTRODUODENOSCOPY (EGD);  Surgeon: Shann Medal, MD;  Location: [facility];  Service: General;  Laterality: N/A;  PF  . ESOPHAGOGASTRODUODENOSCOPY (EGD) WITH ESOPHAGEAL DILATION N/A 09/29/2012   Procedure: ESOPHAGOGASTRODUODENOSCOPY (EGD) WITH ESOPHAGEAL DILATION;  Surgeon: Milus Banister, MD;  Location: [facility];  Service: Endoscopy;  Laterality: N/A;  . INSERTION OF DIALYSIS CATHETER N/A 01/03/2015   Procedure: INSERTION OF DIALYSIS CATHETER RIGHT INTERNAL JUGULAR;  Surgeon: Angelia Mould, MD;  Location: [facility];  Service: Vascular;  Laterality: N/A;  . LAPAROSCOPIC GASTRIC SLEEVE RESECTION N/A 07/19/2012   Procedure: LAPAROSCOPIC SLEEVE GASTRECTOMY with EGD;  Surgeon: Madilyn Hook, DO;  Location: [facility];  Service: General;  Laterality:  N/A;  laparoscopic sleeve gastrectomy with EGD  . PERIPHERAL VASCULAR CATHETERIZATION Left 05/23/2015   Procedure: Nolon Stalls;  Surgeon: Conrad [place], MD;  Location: [facility];  Service: Cardiovascular;  Laterality: Left;  upper aRM  . PERIPHERAL VASCULAR CATHETERIZATION Left 05/23/2015    Procedure: Peripheral Vascular Balloon Angioplasty;  Surgeon: Conrad , MD;  Location: [facility];  Service: Cardiovascular;  Laterality: Left;  av fistula  . TUBAL LIGATION  1993  . UPPER GI ENDOSCOPY N/A 07/19/2012   Procedure: UPPER GI ENDOSCOPY;  Surgeon: Madilyn Hook, DO;  Location: [facility];  Service: General;  Laterality: N/A;    Social History:  reports that she quit smoking about 10 years ago. Her smoking use included cigarettes. She has a 16.00 pack-year smoking history. She has never used smokeless tobacco. She reports that she does not drink alcohol or use drugs.  Allergies:  Allergies  Allergen Reactions  . Amlodipine Swelling    edema  . Clonidine Derivatives Swelling    Limbs swell  . Welchol [Colesevelam Hcl] Nausea Only    Medications Prior to Admission  Medication Sig Dispense Refill  . acetaminophen (TYLENOL) 500 MG tablet Take 1,000 mg by mouth every 6 (six) hours as needed (for pain).    Lorin Picket 1 GM 210 MG(Fe) tablet TAKE 1 TABLET BY MOUTH THREE TIMES A DAY WITH MEALS.   SWALLOW WHOLE, DO NOT CHEW OR CRUSH MEDICATION  3  . calcium acetate (PHOSLO) 667 MG capsule Take 2 capsules (1,334 mg total) by mouth 3 (three) times daily with meals. 180 capsule 0  . carvedilol (COREG) 6.25 MG tablet Take 6.25 mg by mouth 2 (two) times daily with a meal.  12  . cyclobenzaprine (FLEXERIL) 10 MG tablet Take 0.5-1 tablets (5-10 mg total) by mouth 2 (two) times daily as needed for muscle spasms. 20 tablet 0  . folic acid (FOLVITE) 1 MG tablet Take 1 tablet (1 mg total) by mouth daily. 90 tablet 1  . gentamicin cream (GARAMYCIN) 0.1 % APPLY TO EXIT SITE DAILY AS DIRECTED  4  . heparin 1000 UNIT/ML injection ADMINISTER AS DIRECTED  99  . irbesartan (AVAPRO) 300 MG tablet TAKE 1 TABLET BY MOUTH EVERYDAY AT BEDTIME 90 tablet 0  . multivitamin (RENA-VIT) TABS tablet Take 1 tablet by mouth at bedtime. 30 tablet 0  . polyethylene glycol (MIRALAX / GLYCOLAX) packet Take 17 g by  mouth daily as needed. (Patient taking differently: Take 17 g by mouth daily as needed for mild constipation. ) 14 each 0  . thiamine (VITAMIN B-1) 100 MG tablet Take 1 tablet (100 mg total) by mouth daily. 90 tablet 1    Blood pressure 131/87, pulse 66, temperature 98.2 F (36.8 C), temperature source Oral, resp. rate 18, SpO2 100 %. Physical Exam: Physical Exam  Constitutional: She is oriented to person, place, and time. She appears well-developed. She is cooperative.  Non-toxic appearance. No distress.  HENT:  Head: Normocephalic and atraumatic.  Right Ear: External ear normal.  Left Ear: External ear normal.  Nose: Nose normal.  Mouth/Throat: Oropharynx is clear and moist and mucous membranes are normal.  Eyes: Pupils are equal, round, and reactive to light. Conjunctivae, EOM and lids are normal. No scleral icterus.  Neck: Normal range of motion and phonation normal. Neck supple.  Cardiovascular: Normal rate and regular rhythm.  Pulmonary/Chest: Effort normal and breath sounds normal.  Abdominal: Soft. Bowel sounds are normal. She exhibits no distension. There is tenderness  in the epigastric area. There is no rigidity, no rebound and no guarding.  Obese, PD catheter present and without signs of infection   Musculoskeletal:  ROM grossly intact in bilateral upper and lower extremities.   Neurological: She is alert and oriented to person, place, and time.  Skin: Skin is warm, dry and intact.  Psychiatric: She has a normal mood and affect. Her speech is normal and behavior is normal.    Results for orders placed or performed during the hospital encounter of 08/24/17 (from the past 48 hour(s))  CBG monitoring, ED     Status: Abnormal   Collection Time: 08/24/17  5:14 PM  Result Value Ref Range   Glucose-Capillary 364 (H) 70 - 99 mg/dL  Urinalysis, Routine w reflex microscopic     Status: Abnormal   Collection Time: 08/24/17  5:18 PM  Result Value Ref Range   Color, Urine YELLOW  YELLOW   APPearance TURBID (A) CLEAR   Specific Gravity, Urine 1.012 1.005 - 1.030   pH 7.0 5.0 - 8.0   Glucose, UA >=500 (A) NEGATIVE mg/dL   Hgb urine dipstick SMALL (A) NEGATIVE   Bilirubin Urine NEGATIVE NEGATIVE   Ketones, ur NEGATIVE NEGATIVE mg/dL   Protein, ur 100 (A) NEGATIVE mg/dL   Nitrite NEGATIVE NEGATIVE   Leukocytes, UA LARGE (A) NEGATIVE   RBC / HPF 0-5 0 - 5 RBC/hpf   WBC, UA >50 (H) 0 - 5 WBC/hpf   Bacteria, UA RARE (A) NONE SEEN   Squamous Epithelial / LPF 0-5 0 - 5   WBC Clumps PRESENT    Non Squamous Epithelial 0-5 (A) NONE SEEN    Comment: Performed at [facility], [address], [place], [place] [address]  Lipase, blood     Status: Abnormal   Collection Time: 08/24/17  5:19 PM  Result Value Ref Range   Lipase 682 (H) 11 - 51 U/L    Comment: RESULTS CONFIRMED BY MANUAL DILUTION Performed at [facility], [place] [address] [place], [place] [address]   Comprehensive metabolic panel     Status: Abnormal   Collection Time: 08/24/17  5:19 PM  Result Value Ref Range   Sodium 136 135 - 145 mmol/L   Potassium 4.4 3.5 - 5.1 mmol/L   Chloride 96 (L) 98 - 111 mmol/L   CO2 26 22 - 32 mmol/L   Glucose, Bld 394 (H) 70 - 99 mg/dL   BUN 60 (H) 6 - 20 mg/dL   Creatinine, Ser 14.37 (H) 0.44 - 1.00 mg/dL   Calcium 8.9 8.9 - 10.3 mg/dL   Total Protein 6.6 6.5 - 8.1 g/dL   Albumin 3.2 (L) 3.5 - 5.0 g/dL   AST 12 (L) 15 - 41 U/L   ALT 14 0 - 44 U/L   Alkaline Phosphatase 80 38 - 126 U/L   Total Bilirubin 0.8 0.3 - 1.2 mg/dL   GFR calc non Af Amer 3 (L) >60 mL/min   GFR calc Af Amer 3 (L) >60 mL/min    Comment: (NOTE) The eGFR has been calculated using the CKD EPI equation. This calculation has not been validated in all clinical situations. eGFR's persistently <60 mL/min signify possible Chronic Kidney Disease.    Anion gap 14 5 - 15    Comment: Performed at [facility] [address], [place], [place] [address]  CBC     Status: Abnormal    Collection Time: 08/24/17  5:19 PM  Result Value  Ref Range   WBC 9.8 4.0 - 10.5 K/uL   RBC 3.15 (L) 3.87 - 5.11 MIL/uL   Hemoglobin 10.0 (L) 12.0 - 15.0 g/dL   HCT 31.6 (L) 36.0 - 46.0 %   MCV 100.3 (H) 78.0 - 100.0 fL   MCH 31.7 26.0 - 34.0 pg   MCHC 31.6 30.0 - 36.0 g/dL   RDW 12.9 11.5 - 15.5 %   Platelets 337 150 - 400 K/uL    Comment: Performed at [facility] [address], [place], [place] [address]  CBG monitoring, ED     Status: Abnormal   Collection Time: 08/25/17  5:37 AM  Result Value Ref Range   Glucose-Capillary 233 (H) 70 - 99 mg/dL  CBC     Status: Abnormal   Collection Time: 08/25/17  6:08 AM  Result Value Ref Range   WBC 8.3 4.0 - 10.5 K/uL   RBC 3.01 (L) 3.87 - 5.11 MIL/uL   Hemoglobin 9.5 (L) 12.0 - 15.0 g/dL   HCT 30.3 (L) 36.0 - 46.0 %   MCV 100.7 (H) 78.0 - 100.0 fL   MCH 31.6 26.0 - 34.0 pg   MCHC 31.4 30.0 - 36.0 g/dL   RDW 12.7 11.5 - 15.5 %   Platelets 279 150 - 400 K/uL    Comment: Performed at [facility], [place] [address], [place], [place] [address]  Creatinine, serum     Status: Abnormal   Collection Time: 08/25/17  6:08 AM  Result Value Ref Range   Creatinine, Ser 14.27 (H) 0.44 - 1.00 mg/dL   GFR calc non Af Amer 3 (L) >60 mL/min   GFR calc Af Amer 3 (L) >60 mL/min    Comment: (NOTE) The eGFR has been calculated using the CKD EPI equation. This calculation has not been validated in all clinical situations. eGFR's persistently <60 mL/min signify possible Chronic Kidney Disease. Performed at [facility], [place] [address] [place], [place] [address]   Troponin I     Status: None   Collection Time: 08/25/17  6:08 AM  Result Value Ref Range   Troponin I <0.03 <0.03 ng/mL    Comment: Performed at [facility] [address] [place], [place] [address]  CBG monitoring, ED     Status: Abnormal   Collection Time: 08/25/17  7:44 AM  Result Value Ref Range   Glucose-Capillary 204 (H) 70 - 99 mg/dL   Comment 1 Notify RN     Comment 2 Document in Chart   Lipid panel     Status: Abnormal   Collection Time: 08/25/17 11:30 AM  Result Value Ref Range   Cholesterol 182 0 - 200 mg/dL   Triglycerides 118 <150 mg/dL   HDL 32 (L) >40 mg/dL   Total CHOL/HDL Ratio 5.7 RATIO   VLDL 24 0 - 40 mg/dL   LDL Cholesterol 126 (H) 0 - 99 mg/dL    Comment:        Total Cholesterol/HDL:CHD Risk Coronary Heart Disease Risk Table                     Men   Women  1/2 Average Risk   3.4   3.3  Average Risk       5.0   4.4  2 X Average Risk   9.6   7.1  3 X Average Risk  23.4   11.0        Use the calculated  Patient Ratio above and the CHD Risk Table to determine the patient's CHD Risk.        ATP III CLASSIFICATION (LDL):  <100     mg/dL   Optimal  100-129  mg/dL   Near or Above                    Optimal  130-159  mg/dL   Borderline  160-189  mg/dL   High  >190     mg/dL   Very High Performed at [facility] [address], [place], [place] [address]   Glucose, capillary     Status: Abnormal   Collection Time: 08/25/17 11:30 AM  Result Value Ref Range   Glucose-Capillary 183 (H) 70 - 99 mg/dL   Ct Abdomen Pelvis W Contrast  Result Date: 08/25/2017 CLINICAL DATA:  Crampy abdominal pain. EXAM: CT ABDOMEN AND PELVIS WITH CONTRAST TECHNIQUE: Multidetector CT imaging of the abdomen and pelvis was performed using the standard protocol following bolus administration of intravenous contrast. CONTRAST:  180m OMNIPAQUE IOHEXOL 300 MG/ML  SOLN COMPARISON:  CT abdomen pelvis 02/05/2015 FINDINGS: LOWER CHEST: No basilar pulmonary nodules or pleural effusion. No apical pericardial effusion. HEPATOBILIARY: Normal hepatic contours and density. No intra- or extrahepatic biliary dilatation. Normal gallbladder. PANCREAS: Normal parenchymal contours without ductal dilatation. No peripancreatic fluid collection. SPLEEN: Normal. ADRENALS/URINARY TRACT: --Adrenal glands: Normal. --Right kidney/ureter: Atrophic kidney --Left  kidney/ureter: The atrophic kidney --Urinary bladder: Normal for degree of distention. There is a peritoneal dialysis catheter coiled between the uterus and urinary bladder. STOMACH/BOWEL: --Stomach/Duodenum: Postsurgical changes of the stomach. Normal duodenal course. --Small bowel: No dilatation or inflammation. --Colon: No focal abnormality. --Appendix: Normal. VASCULAR/LYMPHATIC: Atherosclerotic calcification is present within the non-aneurysmal abdominal aorta, without hemodynamically significant stenosis. The portal vein, splenic vein, superior mesenteric vein and IVC are patent. No abdominal or pelvic lymphadenopathy. REPRODUCTIVE: Normal uterus.  No adnexal mass. MUSCULOSKELETAL. No bony spinal canal stenosis or focal osseous abnormality. OTHER: Inflammatory stranding surrounding a fat-containing focus in the right upper quadrant. IMPRESSION: 1. Fat-containing area of inflammatory stranding in the right upper quadrant is most consistent with omental infarction. 2. No pancreatic abnormality. 3.  Aortic Atherosclerosis (ICD10-I70.0). Electronically Signed   By: KUlyses JarredM.D.   On: 08/25/2017 02:38   Dg Abd Acute W/chest  Result Date: 08/24/2017 CLINICAL DATA:  Diarrhea and generalized stomach pain, nausea, and vomiting for 2 weeks, former smoker, history hypertension, end-stage renal disease on dialysis, type II diabetes mellitus EXAM: DG ABDOMEN ACUTE W/ 1V CHEST COMPARISON:  Chest radiographs 05/21/2017, CT abdomen and pelvis 02/05/2015 FINDINGS: Upper normal heart size. Mediastinal contours and pulmonary vascularity normal. Atherosclerotic calcification at aorta Lungs clear. No pulmonary infiltrate, pleural effusion or pneumothorax. Bones demineralized. Surgical clips/staples from history of gastric sleeve resection. Peritoneal dialysis catheter. Bowel gas pattern normal. No bowel dilatation bowel wall thickening, or free air. Osseous structures unremarkable. Calcification in LEFT mid abdomen is  likely a tiny calcified lymph node by prior CT. IMPRESSION: Normal bowel gas pattern. Clear lungs. No acute abnormalities. Electronically Signed   By: MLavonia DanaM.D.   On: 08/24/2017 11:21      Assessment/Plan Morbid obesity - Hx of gastric sleeve Hyperglycemia - blood glucose levels in the 300s and 400s, Hx of Type II diabetes mellitus prior  to sleeve gastrectomy ESRD on peritoneal dialysis - nephrology consulted HTN HLD  Acute pancreatitis - etiology unclear, CT scan with normal looking pancreas - lipase 682 yesterday afternoon -  lipase elevation could be related to below or this could be true pancreatitis - RUQ [place] ordered - will follow for results of this - LFTs not elevated Abdominal pain - omental infarction read on CT scan today - CLD and IVF hydration per nephrology - ADAT from clear liquids - manage pain as able with anti-inflammatory agents - this likely does not require surgical intervention   Brigid Re, [facility] Surgery 08/25/2017, 12:55 PM Pager: [phone] Consults: [phone] Mon-Fri 7:00 am-4:30 pm Sat-Sun 7:00 am-11:30 am

## 2017-08-25 NOTE — ED Provider Notes (Signed)
Care assumed from previous provider PA Frod. Please see their note for further details to include full history and physical. To summarize in short pt is a 52 year old female history of end-stage renal disease on peritoneal dialysis, hyperlipidemia, diabetes presents to the emergency department today for elevated pancreatic enzymes by primary care.  She reports generalized abdominal pain with nausea and emesis.  Also reports some urinary symptoms.. Case discussed, plan agreed upon.   Prior provider was awaiting CT scan of abdomen and then admit patient for pancreatitis.  She was also given Rocephin for UTI.  CT scan returned that shows concern for omentum infarction but no other acute abnormalities.  Patient will be admitted for pain control and further management.  Spoke with Dr. Hal Hope with hospital medicine who agrees to admission.  He has asked that I order peritoneal fluid stain and cultures.  Nursing staff informed to call the dialysis nurse.       Doristine Devoid, PA-C 08/25/17 0326    Sherwood Gambler, MD 08/26/17 [phone]

## 2017-08-26 DIAGNOSIS — I10 Essential (primary) hypertension: Secondary | ICD-10-CM

## 2017-08-26 DIAGNOSIS — K55069 Acute infarction of intestine, part and extent unspecified: Secondary | ICD-10-CM

## 2017-08-26 LAB — COMPREHENSIVE METABOLIC PANEL
ALT: 12 U/L (ref 0–44)
AST: 16 U/L (ref 15–41)
Albumin: 2.7 g/dL — ABNORMAL LOW (ref 3.5–5.0)
Alkaline Phosphatase: 65 U/L (ref 38–126)
Anion gap: 15 (ref 5–15)
BUN: 59 mg/dL — ABNORMAL HIGH (ref 6–20)
CO2: 25 mmol/L (ref 22–32)
Calcium: 8.8 mg/dL — ABNORMAL LOW (ref 8.9–10.3)
Chloride: 96 mmol/L — ABNORMAL LOW (ref 98–111)
Creatinine, Ser: 14.19 mg/dL — ABNORMAL HIGH (ref 0.44–1.00)
GFR calc Af Amer: 3 mL/min — ABNORMAL LOW (ref >60)
GFR calc non Af Amer: 3 mL/min — ABNORMAL LOW (ref >60)
Glucose, Bld: 191 mg/dL — ABNORMAL HIGH (ref 70–99)
Potassium: 4.2 mmol/L (ref 3.5–5.1)
Sodium: 136 mmol/L (ref 135–145)
Total Bilirubin: 0.7 mg/dL (ref 0.3–1.2)
Total Protein: 5.8 g/dL — ABNORMAL LOW (ref 6.5–8.1)

## 2017-08-26 LAB — CBC
HCT: 29.6 % — ABNORMAL LOW (ref 36.0–46.0)
Hemoglobin: 9.3 g/dL — ABNORMAL LOW (ref 12.0–15.0)
MCH: 31.3 pg (ref 26.0–34.0)
MCHC: 31.4 g/dL (ref 30.0–36.0)
MCV: 99.7 fL (ref 78.0–100.0)
Platelets: 310 10*3/uL (ref 150–400)
RBC: 2.97 MIL/uL — ABNORMAL LOW (ref 3.87–5.11)
RDW: 12.8 % (ref 11.5–15.5)
WBC: 8.1 10*3/uL (ref 4.0–10.5)

## 2017-08-26 LAB — GLUCOSE, CAPILLARY
Glucose-Capillary: 195 mg/dL — ABNORMAL HIGH (ref 70–99)
Glucose-Capillary: 164 mg/dL — ABNORMAL HIGH (ref 70–99)
Glucose-Capillary: 135 mg/dL — ABNORMAL HIGH (ref 70–99)
Glucose-Capillary: 150 mg/dL — ABNORMAL HIGH (ref 70–99)
Glucose-Capillary: 126 mg/dL — ABNORMAL HIGH (ref 70–99)
Glucose-Capillary: 146 mg/dL — ABNORMAL HIGH (ref 70–99)
Glucose-Capillary: 226 mg/dL — ABNORMAL HIGH (ref 70–99)

## 2017-08-26 LAB — LIPASE, BLOOD: Lipase: 344 U/L — ABNORMAL HIGH (ref 11–51)

## 2017-08-26 LAB — HIV ANTIBODY (ROUTINE TESTING W REFLEX): HIV Screen 4th Generation wRfx: NONREACTIVE

## 2017-08-26 MED ORDER — DELFLEX-LC/1.5% DEXTROSE 344 MOSM/L IP SOLN: INTRAPERITONEAL | Status: DC

## 2017-08-26 MED ORDER — DELFLEX-LC/2.5% DEXTROSE 394 MOSM/L IP SOLN: INTRAPERITONEAL | Status: DC

## 2017-08-26 NOTE — Progress Notes (Signed)
PROGRESS NOTE    Brenda Davis  DEY:[id number] DOB: 1965/12/27 DOA: 08/24/2017 PCP: [name], MD   Brief Narrative: 52 y.o.femalewithhistory of ESRD on peritoneal dialysis, hypertension diabetes mellitus type 2 was referred to the ER by patient's primary care physician after patient labs were found to be abnormal. Patient states she has been having abdominal pain mostly in the epigastric area with multiple episodes of nausea vomiting and diarrhea over the last week.   CT abdomen pelvis shows fat-containing area of inflammation. CT abdomen and pelvis concerning for omental infarction.  She was admitted for acute pancreatitis.   Assessment & Plan:   Principal Problem:   Acute pancreatitis Active Problems:   HYPERTENSION, BENIGN ESSENTIAL   Omental infarction ([place])   ESRD on peritoneal dialysis ([place])   DM (diabetes mellitus), type 2 with renal complications (HCC)   Nausea, vomiting and abdominal pain possibly from acute pancreatitis.  Incidental finding of omental infarction on the CT abdomen and pelvis.  Improving lipase, from 682 to 344.  Liver function panel wnl.  Triglycerides are wnl.  Surgery consulted for omental infarction, treat with pain control. No indication for surgery. She is currently on meropenam started on 8/7 empirically. Omental infarction has risk of infection and abscess, will keep her on meropenam for another 48 hours.  If her pain, nausea and vomiting improves in the next 24 hours, plans to advance her diet to soft diet in am.     Hypertension:  Well controlled.    ESRD ON peritoneal dialysis.  PD tonight. Nephrology on board.    Uncontrolled DM with hyperglycemia: CBG (last 3)  Recent Labs    08/26/17 0541 08/26/17 0911 08/26/17 1329  GLUCAP 164* 135* 150*   Resume SSI.     DVT prophylaxis: sq heparin.  Code Status: full code.  Family Communication: none at bedside.  Disposition Plan: pending clinical improvement.      Consultants:   Surgery  Nephrology.    Procedures: None.    Antimicrobials:none.    Subjective: She reports the pain has improved. Some nausea earlier todaywith one episode of vomiting after breakfast.  Objective: Vitals:   08/25/17 2105 08/26/17 0453 08/26/17 0650 08/26/17 0830  BP: (!) 101/58 133/74 128/75 129/79  Pulse: 74 75 75 82  Resp: 18 18 18 18   Temp: 98.7 F (37.1 C) 99 F (37.2 C) 98.9 F (37.2 C) 98.3 F (36.8 C)  TempSrc: Oral Oral Oral Oral  SpO2: 100% 100% 99% 99%  Weight:        Intake/Output Summary (Last 24 hours) at 08/26/2017 1730 Last data filed at 08/26/2017 0640 Gross per 24 hour  Intake 1643.33 ml  Output 0 ml  Net 1643.33 ml   Filed Weights   08/25/17 1755  Weight: 132 kg    Examination:  General exam: Appears calm and comfortable  Respiratory system: Clear to auscultation. Respiratory effort normal. Cardiovascular system: S1 & S2 heard, RRR. No JVD, murmurs, rubs, gallops or clicks. No pedal edema. Gastrointestinal system: Abdomen is nondistended, soft and mildly tender in the epigastric area. Normal bowel sounds heard. Central nervous system: Alert and oriented. No focal neurological deficits. Extremities: Symmetric 5 x 5 power. Skin: No rashes, lesions or ulcers Psychiatry :Mood & affect appropriate.     Data Reviewed: I have personally reviewed following labs and imaging studies  CBC: Recent Labs  Lab 08/24/17 1201 08/24/17 1719 08/25/17 0608 08/26/17 0517  WBC 8.3 9.8 8.3 8.1  NEUTROABS 6.1  --   --   --  HGB 10.3* 10.0* 9.5* 9.3*  HCT 31.3* 31.6* 30.3* 29.6*  MCV 98.4 100.3* 100.7* 99.7  PLT 322.0 337 279 947   Basic Metabolic Panel: Recent Labs  Lab 08/24/17 1201 08/24/17 1719 08/25/17 0608 08/26/17 0517  NA 132* 136  --  136  K 4.6 4.4  --  4.2  CL 92* 96*  --  96*  CO2 27 26  --  25  GLUCOSE 444* 394*  --  191*  BUN 59* 60*  --  59*  CREATININE 13.56* 14.37* 14.27* 14.19*  CALCIUM 9.2 8.9  --   8.8*   GFR: Estimated Creatinine Clearance: 6.4 mL/min (A) (by C-G formula based on SCr of 14.19 mg/dL (H)). Liver Function Tests: Recent Labs  Lab 08/24/17 1201 08/24/17 1719 08/26/17 0517  AST 8 12* 16  ALT 10 14 12   ALKPHOS 76 80 65  BILITOT 0.4 0.8 0.7  PROT 7.1 6.6 5.8*  ALBUMIN 3.7 3.2* 2.7*   Recent Labs  Lab 08/24/17 1201 08/24/17 1719 08/25/17 1130 08/26/17 0514  LIPASE 884.0* 682*  --  344*  AMYLASE 59  --  92  --    No results for input(s): AMMONIA in the last 168 hours. Coagulation Profile: No results for input(s): INR, PROTIME in the last 168 hours. Cardiac Enzymes: Recent Labs  Lab 08/25/17 0608  TROPONINI <0.03   BNP (last 3 results) No results for input(s): PROBNP in the last 8760 hours. HbA1C: Recent Labs    08/25/17 0608  HGBA1C 7.8*   CBG: Recent Labs  Lab 08/25/17 2105 08/26/17 0135 08/26/17 0541 08/26/17 0911 08/26/17 1329  GLUCAP 185* 195* 164* 135* 150*   Lipid Profile: Recent Labs    08/25/17 1130  CHOL 182  HDL 32*  LDLCALC 126*  TRIG 118  CHOLHDL 5.7   Thyroid Function Tests: No results for input(s): TSH, T4TOTAL, FREET4, T3FREE, THYROIDAB in the last 72 hours. Anemia Panel: Recent Labs    08/24/17 1201  VITAMINB12 663  FOLATE 10.3  FERRITIN 946.3*  IRON 41*   Sepsis Labs: No results for input(s): PROCALCITON, LATICACIDVEN in the last 168 hours.  No results found for this or any previous visit (from the past 240 hour(s)).       Radiology Studies: Ct Abdomen Pelvis W Contrast  Result Date: 08/25/2017 CLINICAL DATA:  Crampy abdominal pain. EXAM: CT ABDOMEN AND PELVIS WITH CONTRAST TECHNIQUE: Multidetector CT imaging of the abdomen and pelvis was performed using the standard protocol following bolus administration of intravenous contrast. CONTRAST:  160m OMNIPAQUE IOHEXOL 300 MG/ML  SOLN COMPARISON:  CT abdomen pelvis 02/05/2015 FINDINGS: LOWER CHEST: No basilar pulmonary nodules or pleural effusion. No  apical pericardial effusion. HEPATOBILIARY: Normal hepatic contours and density. No intra- or extrahepatic biliary dilatation. Normal gallbladder. PANCREAS: Normal parenchymal contours without ductal dilatation. No peripancreatic fluid collection. SPLEEN: Normal. ADRENALS/URINARY TRACT: --Adrenal glands: Normal. --Right kidney/ureter: Atrophic kidney --Left kidney/ureter: The atrophic kidney --Urinary bladder: Normal for degree of distention. There is a peritoneal dialysis catheter coiled between the uterus and urinary bladder. STOMACH/BOWEL: --Stomach/Duodenum: Postsurgical changes of the stomach. Normal duodenal course. --Small bowel: No dilatation or inflammation. --Colon: No focal abnormality. --Appendix: Normal. VASCULAR/LYMPHATIC: Atherosclerotic calcification is present within the non-aneurysmal abdominal aorta, without hemodynamically significant stenosis. The portal vein, splenic vein, superior mesenteric vein and IVC are patent. No abdominal or pelvic lymphadenopathy. REPRODUCTIVE: Normal uterus.  No adnexal mass. MUSCULOSKELETAL. No bony spinal canal stenosis or focal osseous abnormality. OTHER: Inflammatory stranding surrounding a fat-containing focus in  the right upper quadrant. IMPRESSION: 1. Fat-containing area of inflammatory stranding in the right upper quadrant is most consistent with omental infarction. 2. No pancreatic abnormality. 3.  Aortic Atherosclerosis (ICD10-I70.0). Electronically Signed   By: Ulyses Jarred M.D.   On: 08/25/2017 02:38   US Abdomen Limited Ruq  Result Date: 08/25/2017 CLINICAL DATA:  Pancreatitis. EXAM: ULTRASOUND ABDOMEN LIMITED RIGHT UPPER QUADRANT COMPARISON:  None. FINDINGS: Gallbladder: No wall thickening visualized. There is sludge in the gallbladder. No sonographic Murphy sign noted by sonographer. Common bile duct: Diameter: 4.5 mm Liver: No focal lesion identified. There is mild diffuse increased echotexture of the liver. Portal vein is patent on color Doppler  imaging with normal direction of blood flow towards the liver. IMPRESSION: Sludge in the gallbladder. No sonographic evidence of acute cholecystitis. Electronically Signed   By: Abelardo Diesel M.D.   On: 08/25/2017 14:59        Scheduled Meds: . calcium acetate  1,334 mg Oral TID WC  . carvedilol  6.25 mg Oral BID WC  . folic acid  1 mg Oral Daily  . gentamicin cream  1 application Topical Daily  . heparin  5,000 Units Subcutaneous Q8H  . insulin aspart  0-9 Units Subcutaneous Q4H  . irbesartan  300 mg Oral Daily  . multivitamin  1 tablet Oral QHS  . thiamine  100 mg Oral Daily   Continuous Infusions: . dialysis solution 1.5% low-MG/low-CA    . dialysis solution 2.5% low-MG/low-CA    . meropenem (MERREM) IV 500 mg (08/25/17 1841)     LOS: 1 day    Time spent: 35 minutes    Hosie Poisson, MD [facility] Pager [phone]  If 7PM-7AM, please contact night-coverage [email or web address] Password [email or web address] 08/26/2017, 5:30 PM

## 2017-08-26 NOTE — Progress Notes (Signed)
[facility] Progress Note  Subjective: no new c/o  Vitals:   08/25/17 2105 08/26/17 0453 08/26/17 0650 08/26/17 0830  BP: (!) 101/58 133/74 128/75 129/79  Pulse: 74 75 75 82  Resp: 18 18 18 18   Temp: 98.7 F (37.1 C) 99 F (37.2 C) 98.9 F (37.2 C) 98.3 F (36.8 C)  TempSrc: Oral Oral  Oral  SpO2: 100% 100% 99% 99%  Weight:        Inpatient medications: . calcium acetate  1,334 mg Oral TID WC  . carvedilol  6.25 mg Oral BID WC  . folic acid  1 mg Oral Daily  . gentamicin cream  1 application Topical Daily  . heparin  5,000 Units Subcutaneous Q8H  . insulin aspart  0-9 Units Subcutaneous Q4H  . irbesartan  300 mg Oral Daily  . multivitamin  1 tablet Oral QHS  . thiamine  100 mg Oral Daily   . dialysis solution 1.5% low-MG/low-CA    . meropenem (MERREM) IV 500 mg (08/25/17 1841)   acetaminophen **OR** acetaminophen, dianeal solution for CAPD/CCPD with heparin, morphine injection, ondansetron **OR** ondansetron (ZOFRAN) IV  Iron/TIBC/Ferritin/ %Sat    Component Value Date/Time   IRON 41 (L) 08/24/2017 1201   TIBC 290 01/03/2015 1650   FERRITIN 946.3 (H) 08/24/2017 1201   IRONPCTSAT 18.1 (L) 08/24/2017 1201    Exam: Gen alert, obese, no distress No jvd or bruits Chest clear bilat RRR no MRG Abd soft some mid-abd tenderness, no rebound, PD cath mid-abdomen, clean exit site, no mass or ascites +bs obese Ext no LE edema, no wounds or ulcers Neuro is alert, Ox 3 , nf LUA AVF+ bruit    Home meds:  - coreg 6.25 bid/ irbesartan 300 qd  - phoslo ac/ auryxia q tid ac  - prn's/ vitamins  Dialysis: CCPD   130kg  5 exchanges 2500 fill, 1.5 hr dwell, last fill O, no day exchanges, heparin prn for fibrin - last Hb 9.4, tsat 30%, ferr 1329   Impression: 1  Abdominal pain / [place] Dura - omental infarction +/- acute pancreatitis, pain improving.  2  ESRD on CCPD.  3  Volume - at dry wt. Up 2kg, will use half 1.5% and 2.5% until eating better.  4   Obesity 5  Uncont DM - not on any home diabetes meds; BS's better here 6  HTN - cont meds  Plan - PD tonight    Kelly Splinter MD [facility] pager [phone]   08/26/2017, 11:09 AM   Recent Labs  Lab 08/24/17 1719 08/25/17 0608 08/26/17 0517  NA 136  --  136  K 4.4  --  4.2  CL 96*  --  96*  CO2 26  --  25  GLUCOSE 394*  --  191*  BUN 60*  --  59*  CREATININE 14.37* 14.27* 14.19*  CALCIUM 8.9  --  8.8*  ALBUMIN 3.2*  --  2.7*   Recent Labs  Lab 08/24/17 1719 08/26/17 0517  AST 12* 16  ALT 14 12  ALKPHOS 80 65  BILITOT 0.8 0.7  PROT 6.6 5.8*   Recent Labs  Lab 08/24/17 1201  08/25/17 0608 08/26/17 0517  WBC 8.3   < > 8.3 8.1  NEUTROABS 6.1  --   --   --   HGB 10.3*   < > 9.5* 9.3*  HCT 31.3*   < > 30.3* 29.6*  MCV 98.4   < > 100.7* 99.7  PLT 322.0   < >  279 310   < > = values in this interval not displayed.

## 2017-08-26 NOTE — Progress Notes (Signed)
Initial Nutrition Assessment  DOCUMENTATION CODES:   Morbid obesity  INTERVENTION:   30 ml Prostat BID, each supplement provides 100 kcals and 15 grams protein.   Continue Renal MVI  Pt may benefit from smaller, more frequent meals  NUTRITION DIAGNOSIS:   Increased nutrient needs related to chronic illness, acute illness as evidenced by estimated needs.  GOAL:   Patient will meet greater than or equal to 90% of their needs  MONITOR:   PO intake, Supplement acceptance, Labs, Weight trends  REASON FOR ASSESSMENT:   Malnutrition Screening Tool    ASSESSMENT:   52 yo female admitted with abdominal pain with omental infarction +/- acute pancreatitis. Pt with hx of ESRD on CCPD, sleeve gastrectomy in 2014, DM, HTN  Pt reports tolerating PD well at home.  CCPD 5 exchanges, 2.5 L fill, 1.5% and 2.5% split, no day exchange  Pt reports appetite down for the past 2 weeks, not able to tolerate very much due to abdominal pain and vomiting. Prior to this, pt reports eating very well.   Pt reports she has been trying to increase her protein intake.  Pt reports using a protein supplement called "Nectar" that she adds to drinks that was approved by her outpatient Renal RD. Pt reports this contains 40 g of protein per scoop.   Dry wt 130 kg. Current wt 132.2 kg. Pt denies recent wt loss, reports dry wt has been stable.   Pt verbalizes she is compliant with her vitamins and phosphorus binder at home  Noted not on any home meds for DM Lab Results  Component Value Date   HGBA1C 7.8 (H) 08/25/2017   Labs: CBGs 126-241, lipase 344 Meds: PhosLo, Rena-Vit, thiamine, folic aid, ayurxia   Diet Order:   Diet Order            DIET SOFT Room service appropriate? Yes; Fluid consistency: Thin  Diet effective now              EDUCATION NEEDS:   Education needs have been addressed  Skin:  Skin Assessment: Reviewed RN Assessment  Last BM:  8/6  Height:   Ht Readings from Last  1 Encounters:  08/24/17 5\' 5"  (1.651 m)    Weight:   Wt Readings from Last 1 Encounters:  08/26/17 132.2 kg    Ideal Body Weight:  56.8 kg  BMI:  Body mass index is 48.5 kg/m.  Estimated Nutritional Needs:   Kcal:  2000-2200 kcals  Protein:  105-125 g   Fluid:  1.5-2 L   Kerman Passey MS, RD, LDN, CNSC [phone] Pager  [phone] Weekend/On-Call Pager

## 2017-08-27 LAB — GLUCOSE, CAPILLARY
Glucose-Capillary: 126 mg/dL — ABNORMAL HIGH (ref 70–99)
Glucose-Capillary: 148 mg/dL — ABNORMAL HIGH (ref 70–99)
Glucose-Capillary: 157 mg/dL — ABNORMAL HIGH (ref 70–99)
Glucose-Capillary: 166 mg/dL — ABNORMAL HIGH (ref 70–99)
Glucose-Capillary: 183 mg/dL — ABNORMAL HIGH (ref 70–99)
Glucose-Capillary: 241 mg/dL — ABNORMAL HIGH (ref 70–99)

## 2017-08-27 LAB — LIPASE, BLOOD: Lipase: 213 U/L — ABNORMAL HIGH (ref 11–51)

## 2017-08-27 LAB — VITAMIN B1: Vitamin B1 (Thiamine): 8 nmol/L (ref 8–30)

## 2017-08-27 MED ORDER — AMOXICILLIN-POT CLAVULANATE 500-125 MG PO TABS
1.0000 | ORAL_TABLET | Freq: Every day | ORAL | Status: DC
  Administered 2017-08-27: 500 mg via ORAL
  Filled 2017-08-27: qty 1

## 2017-08-27 MED ORDER — PRO-STAT SUGAR FREE PO LIQD
30.0000 mL | Freq: Two times a day (BID) | ORAL | Status: DC
  Administered 2017-08-27 – 2017-08-28 (×2): 30 mL via ORAL
  Filled 2017-08-27 (×3): qty 30

## 2017-08-27 NOTE — Progress Notes (Signed)
PROGRESS NOTE    Brenda Davis  FFM:[id number] DOB: May 21, 1965 DOA: 08/24/2017 PCP: [name], MD   Brief Narrative: 52 y.o.femalewithhistory of ESRD on peritoneal dialysis, hypertension diabetes mellitus type 2 was referred to the ER by patient's primary care physician after patient labs were found to be abnormal. Patient states she has been having abdominal pain mostly in the epigastric area with multiple episodes of nausea vomiting and diarrhea over the last week.   CT abdomen pelvis shows fat-containing area of inflammation. CT abdomen and pelvis concerning for omental infarction.  She was admitted for acute pancreatitis.   Assessment & Plan:   Principal Problem:   Acute pancreatitis Active Problems:   HYPERTENSION, BENIGN ESSENTIAL   Omental infarction ([place])   ESRD on peritoneal dialysis ([place])   DM (diabetes mellitus), type 2 with renal complications (HCC)   Nausea, vomiting and abdominal pain possibly from acute pancreatitis.  Incidental finding of omental infarction on the CT abdomen and pelvis.  Improving lipase, from 682 to 344. Repeat lipase levels today.  Liver function panel wnl.  Triglycerides are wnl.  Surgery consulted for omental infarction, treat with pain control. No indication for surgery. She is currently on meropenam started on 8/7 empirically. Omental infarction has risk of infection and abscess, transition to augmentin to complete the course.  Pt reports she had pain last night, and nausea at 4 am, but right now she is pain free and there are no signs of peritonitis. Would like to try solids today.     Hypertension:  Well controlled.    ESRD ON peritoneal dialysis.  PD tonight. Nephrology on board.    Uncontrolled DM with hyperglycemia: CBG (last 3)  Recent Labs    08/27/17 0033 08/27/17 0514 08/27/17 0857  GLUCAP 241* 183* 157*   Resume SSI. CBGS little better this am.  A1c is 7.8.    Anemia of chronic disease:    Hemoglobin stable around 9.  No signs of bleeding.    DVT prophylaxis: sq heparin.  Code Status: full code.  Family Communication: none at bedside.  Disposition Plan: pending clinical improvement.    Consultants:   Surgery  Nephrology.    Procedures: None.    Antimicrobials: meropenam on discharge transition to augmentin.    Subjective: Pain and nausea better but persistent last night.   Objective: Vitals:   08/26/17 1950 08/26/17 1951 08/27/17 0507 08/27/17 0900  BP:  (!) 153/82 99/71 119/71  Pulse:  75 78 82  Resp:  19 16 18   Temp: 98.1 F (36.7 C)  98.4 F (36.9 C) 99.3 F (37.4 C)  TempSrc: Oral  Oral Oral  SpO2:  100% 99% 98%  Weight:        Intake/Output Summary (Last 24 hours) at 08/27/2017 1133 Last data filed at 08/27/2017 0200 Gross per 24 hour  Intake 320 ml  Output 0 ml  Net 320 ml   Filed Weights   08/25/17 1755 08/26/17 1846  Weight: 132 kg 132.2 kg    Examination:  General exam: Appears calm and comfortable not in distress.  Respiratory system: Clear to auscultation. Respiratory effort normal. No wheezing or rhonchi.  Cardiovascular system: S1 & S2 heard, RRR. No JVD,No pedal edema. Gastrointestinal system: Abdomen is nondistended, soft and mildly tender in the epigastric area. Normal bowel sounds heard. Central nervous system: Alert and oriented. No focal neurological deficits. Extremities: Symmetric 5 x 5 power. No cyanosis or clubbing.  Skin: No rashes, lesions or ulcers Psychiatry :Mood &  affect appropriate.     Data Reviewed: I have personally reviewed following labs and imaging studies  CBC: Recent Labs  Lab 08/24/17 1201 08/24/17 1719 08/25/17 0608 08/26/17 0517  WBC 8.3 9.8 8.3 8.1  NEUTROABS 6.1  --   --   --   HGB 10.3* 10.0* 9.5* 9.3*  HCT 31.3* 31.6* 30.3* 29.6*  MCV 98.4 100.3* 100.7* 99.7  PLT 322.0 337 279 258   Basic Metabolic Panel: Recent Labs  Lab 08/24/17 1201 08/24/17 1719 08/25/17 0608  08/26/17 0517  NA 132* 136  --  136  K 4.6 4.4  --  4.2  CL 92* 96*  --  96*  CO2 27 26  --  25  GLUCOSE 444* 394*  --  191*  BUN 59* 60*  --  59*  CREATININE 13.56* 14.37* 14.27* 14.19*  CALCIUM 9.2 8.9  --  8.8*   GFR: Estimated Creatinine Clearance: 6.4 mL/min (A) (by C-G formula based on SCr of 14.19 mg/dL (H)). Liver Function Tests: Recent Labs  Lab 08/24/17 1201 08/24/17 1719 08/26/17 0517  AST 8 12* 16  ALT 10 14 12   ALKPHOS 76 80 65  BILITOT 0.4 0.8 0.7  PROT 7.1 6.6 5.8*  ALBUMIN 3.7 3.2* 2.7*   Recent Labs  Lab 08/24/17 1201 08/24/17 1719 08/25/17 1130 08/26/17 0514  LIPASE 884.0* 682*  --  344*  AMYLASE 59  --  92  --    No results for input(s): AMMONIA in the last 168 hours. Coagulation Profile: No results for input(s): INR, PROTIME in the last 168 hours. Cardiac Enzymes: Recent Labs  Lab 08/25/17 0608  TROPONINI <0.03   BNP (last 3 results) No results for input(s): PROBNP in the last 8760 hours. HbA1C: Recent Labs    08/25/17 0608  HGBA1C 7.8*   CBG: Recent Labs  Lab 08/26/17 1953 08/26/17 2219 08/27/17 0033 08/27/17 0514 08/27/17 0857  GLUCAP 146* 226* 241* 183* 157*   Lipid Profile: Recent Labs    08/25/17 1130  CHOL 182  HDL 32*  LDLCALC 126*  TRIG 118  CHOLHDL 5.7   Thyroid Function Tests: No results for input(s): TSH, T4TOTAL, FREET4, T3FREE, THYROIDAB in the last 72 hours. Anemia Panel: Recent Labs    08/24/17 1201  VITAMINB12 663  FOLATE 10.3  FERRITIN 946.3*  IRON 41*   Sepsis Labs: No results for input(s): PROCALCITON, LATICACIDVEN in the last 168 hours.  No results found for this or any previous visit (from the past 240 hour(s)).       Radiology Studies: US Abdomen Limited Ruq  Result Date: 08/25/2017 CLINICAL DATA:  Pancreatitis. EXAM: ULTRASOUND ABDOMEN LIMITED RIGHT UPPER QUADRANT COMPARISON:  None. FINDINGS: Gallbladder: No wall thickening visualized. There is sludge in the gallbladder. No  sonographic Murphy sign noted by sonographer. Common bile duct: Diameter: 4.5 mm Liver: No focal lesion identified. There is mild diffuse increased echotexture of the liver. Portal vein is patent on color Doppler imaging with normal direction of blood flow towards the liver. IMPRESSION: Sludge in the gallbladder. No sonographic evidence of acute cholecystitis. Electronically Signed   By: Abelardo Diesel M.D.   On: 08/25/2017 14:59        Scheduled Meds: . [START ON 08/28/2017] amoxicillin-clavulanate  1 tablet Oral Daily  . calcium acetate  1,334 mg Oral TID WC  . carvedilol  6.25 mg Oral BID WC  . folic acid  1 mg Oral Daily  . gentamicin cream  1 application Topical Daily  .  heparin  5,000 Units Subcutaneous Q8H  . insulin aspart  0-9 Units Subcutaneous Q4H  . irbesartan  300 mg Oral Daily  . multivitamin  1 tablet Oral QHS  . thiamine  100 mg Oral Daily   Continuous Infusions: . dialysis solution 1.5% low-MG/low-CA    . dialysis solution 2.5% low-MG/low-CA       LOS: 2 days    Time spent: 35 minutes    Hosie Poisson, MD [facility] Pager [phone]  If 7PM-7AM, please contact night-coverage [email or web address] Password [email or web address] 08/27/2017, 11:33 AM

## 2017-08-27 NOTE — Progress Notes (Signed)
PD tx initiated via tenckhoff w/o problem, VSS, report given to Aneta Mins, RN

## 2017-08-27 NOTE — Progress Notes (Addendum)
[facility] Progress Note   Subjective:   No new complaints. Dialysis went well overnight.   Objective Vitals:   08/26/17 1950 08/26/17 1951 08/27/17 0507 08/27/17 0900  BP:  (!) 153/82 99/71 119/71  Pulse:  75 78 82  Resp:  19 16 18   Temp: 98.1 F (36.7 C)  98.4 F (36.9 C) 99.3 F (37.4 C)  TempSrc: Oral  Oral Oral  SpO2:  100% 99% 98%  Weight:       Physical Exam General:NAD, obese female sitting on bedside Heart:RRR, no mrg Lungs:CTAB Abdomen:soft, obese Extremities:no LE edema Dialysis Access: PD cath in mid abdomen   Filed Weights   08/25/17 1755 08/26/17 1846  Weight: 132 kg 132.2 kg    Intake/Output Summary (Last 24 hours) at 08/27/2017 1133 Last data filed at 08/27/2017 0200 Gross per 24 hour  Intake 320 ml  Output 0 ml  Net 320 ml    Additional Objective Labs: Basic Metabolic Panel: Recent Labs  Lab 08/24/17 1201 08/24/17 1719 08/25/17 0608 08/26/17 0517  NA 132* 136  --  136  K 4.6 4.4  --  4.2  CL 92* 96*  --  96*  CO2 27 26  --  25  GLUCOSE 444* 394*  --  191*  BUN 59* 60*  --  59*  CREATININE 13.56* 14.37* 14.27* 14.19*  CALCIUM 9.2 8.9  --  8.8*   Liver Function Tests: Recent Labs  Lab 08/24/17 1201 08/24/17 1719 08/26/17 0517  AST 8 12* 16  ALT 10 14 12   ALKPHOS 76 80 65  BILITOT 0.4 0.8 0.7  PROT 7.1 6.6 5.8*  ALBUMIN 3.7 3.2* 2.7*   Recent Labs  Lab 08/24/17 1201 08/24/17 1719 08/25/17 1130 08/26/17 0514  LIPASE 884.0* 682*  --  344*  AMYLASE 59  --  92  --    CBC: Recent Labs  Lab 08/24/17 1201 08/24/17 1719 08/25/17 0608 08/26/17 0517  WBC 8.3 9.8 8.3 8.1  NEUTROABS 6.1  --   --   --   HGB 10.3* 10.0* 9.5* 9.3*  HCT 31.3* 31.6* 30.3* 29.6*  MCV 98.4 100.3* 100.7* 99.7  PLT 322.0 337 279 310   Blood Culture    Component Value Date/Time   SDES EXPECTORATED SPUTUM 05/07/2016 0509   SDES EXPECTORATED SPUTUM 05/07/2016 0509   SPECREQUEST NONE 05/07/2016 0509   SPECREQUEST NONE Reflexed from  [id number] 05/07/2016 0509   CULT Consistent with normal respiratory flora. 05/07/2016 0509   REPTSTATUS 05/07/2016 FINAL 05/07/2016 0509   REPTSTATUS 05/09/2016 FINAL 05/07/2016 0509    Cardiac Enzymes: Recent Labs  Lab 08/25/17 0608  TROPONINI <0.03   CBG: Recent Labs  Lab 08/26/17 1953 08/26/17 2219 08/27/17 0033 08/27/17 0514 08/27/17 0857  GLUCAP 146* 226* 241* 183* 157*   Iron Studies:  Recent Labs    08/24/17 1201  IRON 41*  TRANSFERRIN 162.0*  FERRITIN 946.3*   Lab Results  Component Value Date   INR 1.00 05/06/2016   INR 1.13 01/03/2015   Studies/Results: US Abdomen Limited Ruq  Result Date: 08/25/2017 CLINICAL DATA:  Pancreatitis. EXAM: ULTRASOUND ABDOMEN LIMITED RIGHT UPPER QUADRANT COMPARISON:  None. FINDINGS: Gallbladder: No wall thickening visualized. There is sludge in the gallbladder. No sonographic Murphy sign noted by sonographer. Common bile duct: Diameter: 4.5 mm Liver: No focal lesion identified. There is mild diffuse increased echotexture of the liver. Portal vein is patent on color Doppler imaging with normal direction of blood flow towards the liver. IMPRESSION: Sludge in  the gallbladder. No sonographic evidence of acute cholecystitis. Electronically Signed   By: Abelardo Diesel M.D.   On: 08/25/2017 14:59    Medications: . dialysis solution 1.5% low-MG/low-CA    . dialysis solution 2.5% low-MG/low-CA     . [START ON 08/28/2017] amoxicillin-clavulanate  1 tablet Oral Daily  . calcium acetate  1,334 mg Oral TID WC  . carvedilol  6.25 mg Oral BID WC  . folic acid  1 mg Oral Daily  . gentamicin cream  1 application Topical Daily  . heparin  5,000 Units Subcutaneous Q8H  . insulin aspart  0-9 Units Subcutaneous Q4H  . irbesartan  300 mg Oral Daily  . multivitamin  1 tablet Oral QHS  . thiamine  100 mg Oral Daily    Dialysis Orders: CCPD 130kg 5 exchanges 2500 fill, 1.5 hr dwell, last fill O, no day exchanges, heparin prn for fibrin - last Hb  9.4, tsat 30%, ferr 1329  Home meds: - coreg 6.25 bid/ irbesartan 300 qd - phoslo ac/ auryxia q tid ac - prn's/ vitamins  Assessment/Plan: 1. Abdominal pain/^lipase - CT showed omental infarction and no pancreatic abnormalities. [place] neg for cholecystitis.  Not eating solid foods yet.  2. ESRD - on CCPD -K 4.2. orders written.  Using 1.5 bags due to patient diminished appetite. 3. Anemia of CKD- Hgb 9.3.  4. Secondary hyperparathyroidism - Ca 8.8. No phos. Continue binders.  5. HTN/volume - BP well controlled. Appears euvolemic on exam.  Appetite decreased due to nausea/abdominal pain, minimal volume removal with dialysis.   6. Nutrition - Alb 2.7, start prostat. On soft diet, resume renal diet w/fluid restrictions once advanced. DM - per primary   Jen Mow, PA-C [facility] Pager: [phone] 08/27/2017,11:33 AM  LOS: 2 days   Pt seen, examined and agree w A/P as above.  Kelly Splinter MD [employer] pager [phone]   08/27/2017, 11:59 AM

## 2017-08-28 DIAGNOSIS — K8500 Idiopathic acute pancreatitis without necrosis or infection: Secondary | ICD-10-CM

## 2017-08-28 LAB — GLUCOSE, CAPILLARY
Glucose-Capillary: 214 mg/dL — ABNORMAL HIGH (ref 70–99)
Glucose-Capillary: 179 mg/dL — ABNORMAL HIGH (ref 70–99)
Glucose-Capillary: 172 mg/dL — ABNORMAL HIGH (ref 70–99)
Glucose-Capillary: 191 mg/dL — ABNORMAL HIGH (ref 70–99)
Glucose-Capillary: 147 mg/dL — ABNORMAL HIGH (ref 70–99)

## 2017-08-28 LAB — BASIC METABOLIC PANEL
Anion gap: 15 (ref 5–15)
BUN: 54 mg/dL — ABNORMAL HIGH (ref 6–20)
CO2: 24 mmol/L (ref 22–32)
Calcium: 8.6 mg/dL — ABNORMAL LOW (ref 8.9–10.3)
Chloride: 96 mmol/L — ABNORMAL LOW (ref 98–111)
Creatinine, Ser: 15.12 mg/dL — ABNORMAL HIGH (ref 0.44–1.00)
GFR calc Af Amer: 3 mL/min — ABNORMAL LOW (ref >60)
GFR calc non Af Amer: 2 mL/min — ABNORMAL LOW (ref >60)
Glucose, Bld: 187 mg/dL — ABNORMAL HIGH (ref 70–99)
Potassium: 3.8 mmol/L (ref 3.5–5.1)
Sodium: 135 mmol/L (ref 135–145)

## 2017-08-28 LAB — LIPASE, BLOOD: Lipase: 165 U/L — ABNORMAL HIGH (ref 11–51)

## 2017-08-28 MED ORDER — PRO-STAT SUGAR FREE PO LIQD: 30.0000 mL | Freq: Two times a day (BID) | ORAL | 0 refills | Status: DC

## 2017-08-28 MED ORDER — AMOXICILLIN-POT CLAVULANATE 500-125 MG PO TABS: 1.0000 | ORAL_TABLET | Freq: Every day | ORAL | 0 refills | Status: DC

## 2017-08-28 NOTE — Discharge Instructions (Signed)
Acute Pancreatitis Acute pancreatitis is a condition in which the pancreas suddenly gets irritated and swollen (has inflammation). The pancreas is a large gland behind the stomach. It makes enzymes that help to digest food. The pancreas also makes hormones that help to control your blood sugar. Acute pancreatitis happens when the enzymes attack the pancreas and damage it. Most attacks last a couple of days and can cause serious problems. Follow these instructions at home: Eating and drinking  Follow instructions from your doctor about diet. You may need to: ? Avoid alcohol. ? Limit how much fat is in your diet.  Eat small meals often. Avoid eating big meals.  Drink enough fluid to keep your pee (urine) clear or pale yellow.  Do not drink alcohol if it caused your condition. General instructions  Take over-the-counter and prescription medicines only as told by your doctor.  Do not use any tobacco products. These include cigarettes, chewing tobacco, and e-cigarettes. If you need help quitting, ask your doctor.  Get plenty of rest.  If directed, check your blood sugar at home as told by your doctor.  Keep all follow-up visits as told by your doctor. This is important. Contact a doctor if:  You do not get better as quickly as expected.  You have new symptoms.  Your symptoms get worse.  You have lasting pain or weakness.  You continue to feel sick to your stomach (nauseous).  You get better and then you have another pain attack.  You have a fever. Get help right away if:  You cannot eat or keep fluids down.  Your pain becomes very bad.  Your skin or the white part of your eyes turns yellow (jaundice).  You throw up (vomit).  You feel dizzy or you pass out (faint).  Your blood sugar is high (over 300 mg/dL). This information is not intended to replace advice given to you by your health care provider. Make sure you discuss any questions you have with your health care  provider. Document Released: 06/24/2007 Document Revised: 06/13/2015 Document Reviewed: 10/09/2014 Elsevier Interactive Patient Education  2018 [employer]  

## 2017-08-28 NOTE — Progress Notes (Addendum)
[facility] Progress Note   Subjective:   Feeling ok today. No issues with PD.  1 episode of nausea and 1 episode of diarrhea earlier.   Objective Vitals:   08/27/17 2020 08/28/17 0300 08/28/17 0645 08/28/17 1034  BP: 134/79 113/65 139/75 104/61  Pulse: 83 73 82 79  Resp: 14 12 15 18   Temp: 98.8 F (37.1 C)  98.5 F (36.9 C) 98.3 F (36.8 C)  TempSrc: Oral Oral Oral Oral  SpO2: 99% 99% 99% (!) 89%  Weight:   132.2 kg   Height:       Physical Exam General:NAD, obese, pleasant female Heart:RRR Lungs:CTAB Abdomen:soft, obese, mildly tender, ND Extremities:no LE edema Dialysis Access: PD cath in mid abdomen   [facility] Weights   08/27/17 1740 08/27/17 1829 08/28/17 0645  Weight: 132.2 kg 134 kg 132.2 kg    Intake/Output Summary (Last 24 hours) at 08/28/2017 1228 Last data filed at 08/28/2017 1100 Gross per 24 hour  Intake 13077 ml  Output 13591 ml  Net -514 ml    Additional Objective Labs: Basic Metabolic Panel: Recent Labs  Lab 08/24/17 1719 08/25/17 0608 08/26/17 0517 08/28/17 0421  NA 136  --  136 135  K 4.4  --  4.2 3.8  CL 96*  --  96* 96*  CO2 26  --  25 24  GLUCOSE 394*  --  191* 187*  BUN 60*  --  59* 54*  CREATININE 14.37* 14.27* 14.19* 15.12*  CALCIUM 8.9  --  8.8* 8.6*   Liver Function Tests: Recent Labs  Lab 08/24/17 1201 08/24/17 1719 08/26/17 0517  AST 8 12* 16  ALT 10 14 12   ALKPHOS 76 80 65  BILITOT 0.4 0.8 0.7  PROT 7.1 6.6 5.8*  ALBUMIN 3.7 3.2* 2.7*   Recent Labs  Lab 08/24/17 1201  08/25/17 1130 08/26/17 0514 08/27/17 1202 08/28/17 0421  LIPASE 884.0*   < >  --  344* 213* 165*  AMYLASE 59  --  92  --   --   --    < > = values in this interval not displayed.   CBC: Recent Labs  Lab 08/24/17 1201 08/24/17 1719 08/25/17 0608 08/26/17 0517  WBC 8.3 9.8 8.3 8.1  NEUTROABS 6.1  --   --   --   HGB 10.3* 10.0* 9.5* 9.3*  HCT 31.3* 31.6* 30.3* 29.6*  MCV 98.4 100.3* 100.7* 99.7  PLT 322.0 337 279 310     Cardiac Enzymes: Recent Labs  Lab 08/25/17 0608  TROPONINI <0.03   CBG: Recent Labs  Lab 08/27/17 2017 08/28/17 0135 08/28/17 0352 08/28/17 0739 08/28/17 1156  GLUCAP 148* 214* 179* 172* 191*    Lab Results  Component Value Date   INR 1.00 05/06/2016   INR 1.13 01/03/2015   Studies/Results: No results found.  Medications: . dialysis solution 1.5% low-MG/low-CA    . dialysis solution 2.5% low-MG/low-CA     . amoxicillin-clavulanate  1 tablet Oral Daily  . calcium acetate  1,334 mg Oral TID WC  . carvedilol  6.25 mg Oral BID WC  . feeding supplement (PRO-STAT SUGAR FREE 64)  30 mL Oral BID  . folic acid  1 mg Oral Daily  . gentamicin cream  1 application Topical Daily  . heparin  5,000 Units Subcutaneous Q8H  . insulin aspart  0-9 Units Subcutaneous Q4H  . irbesartan  300 mg Oral Daily  . multivitamin  1 tablet Oral QHS  . thiamine  100 mg  Oral Daily    Dialysis Orders: CCPD 130kg 5 exchanges 2500 fill, 1.5 hr dwell, last fill O, no day exchanges, heparin prn for fibrin - last Hb 9.4, tsat 30%, ferr 1329  Home meds: - coreg 6.25 bid/ irbesartan 300 qd - phoslo ac/ auryxia q tid ac - prn's/ vitamins  Assessment/Plan: 1. Abdominal pain/^lipase/omental infarction - CT showed omental infarction and no pancreatic abnormalities. [place] neg for cholecystitis. Seen by Gen surgery, no indication for surgery.  Tolerated solid foods yesterday.  Lipase trending down. Abd pain improving slowly.  2. ESRD - on CCPD -K 3.8. orders written.  Using 1.5 bags due to patient diminished appetite. 3. Anemia of CKD- Hgb 9.3. Not on ESA. 4. Secondary hyperparathyroidism - Ca 8.8. No phos. Continue binders.  5. HTN/volume - BP well controlled. Appears euvolemic on exam.  Appetite decreased due to nausea/abdominal pain, minimal volume removal with dialysis.   6. Nutrition - Alb 2.7, start prostat. Renal diet w/fluid restrictions once advanced. 7. DM - per primary 8. Dispo - to  d/c home today.   Jen Mow, PA-C [facility] Pager: [phone] 08/28/2017,12:28 PM  LOS: 3 days   Pt seen, examined and agree w A/P as above.  Kelly Splinter MD [employer] pager [phone]   08/28/2017, 2:08 PM

## 2017-08-28 NOTE — Discharge Summary (Signed)
Physician Discharge Summary  Brenda Davis:[id number] DOB: 1965/04/04 DOA: 08/24/2017  PCP: [name], MD  Admit date: 08/24/2017 Discharge date: 08/28/2017  Recommendations for Outpatient Follow-up:  Continue Augmentin for 12 more days on discharge Follow up with PCP In 1-2 weeks after discharge  Discharge Diagnoses:  Principal Problem:   Acute pancreatitis Active Problems:   HYPERTENSION, BENIGN ESSENTIAL   Omental infarction ([place])   ESRD on peritoneal dialysis ([place])   DM (diabetes mellitus), type 2 with renal complications ([place])    Discharge Condition: stable   Diet recommendation: as tolerated   History of present illness:  52 y.o.femalewithhistory of ESRD on peritoneal dialysis, hypertension diabetes mellitus type 2 was referred to the ER by patient's primary care physician after patient labs were found to be abnormal. Patient states she has been having abdominal pain mostly in the epigastric area with multiple episodes of nausea vomiting and diarrhea over the last week.  CT abdomen pelvis shows fat-containing area of inflammation. CT abdomen and pelvis concerning for omental infarction.  She was admitted for acute pancreatitis.    Hospital Course:  Nausea, vomiting and abdominal pain possibly due to acute pancreatitis.  - Incidental finding of omental infarction on the CT abdomen and pelvis - Lipase improving - Changed diet to renal and if tolerates it then she can go home - Augmentin for 12 days on discharge  Hypertension, essential  - Well controlled.   ESRD on peritoneal dialysis.  - PD tonight. Nephrology on board.   DM with diabetic nephropathy - S/P gastric sleeve surgery and not on DM meds - Please follow up with PCP in regards to rechecking A1c in outpatient setting. You may need to be started on diabetic medications again but this will be deferred to your PCP  Anemia of chronic disease:  - Hemoglobin stable around 9.  - No  signs of bleeding.    DVT prophylaxis: sq heparin.  Code Status: full code.  .    Consultants:   Surgery  Nephrology.   Procedures:   None   Antimicrobials  meropenam on discharge transition to Augmentin - for 12 more days    Signed:  Leisa Lenz, MD  [facility] 08/28/2017, 10:04 AM  Pager #: [phone]  Time spent in minutes: 35 minutes   Consultations:  Surgery  Renal  Discharge Exam: Vitals:   08/28/17 0300 08/28/17 0645  BP: 113/65 139/75  Pulse: 73 82  Resp: 12 15  Temp:  98.5 F (36.9 C)  SpO2: 99% 99%   Vitals:   08/27/17 1829 08/27/17 2020 08/28/17 0300 08/28/17 0645  BP: 117/69 134/79 113/65 139/75  Pulse: 84 83 73 82  Resp: 20 14 12 15   Temp: 98.7 F (37.1 C) 98.8 F (37.1 C)  98.5 F (36.9 C)  TempSrc: Oral Oral Oral Oral  SpO2: 100% 99% 99% 99%  Weight: 134 kg   132.2 kg  Height:        General: Pt is alert, follows commands appropriately, not in acute distress Cardiovascular: Regular rate and rhythm, S1/S2 +, Respiratory: Clear to auscultation bilaterally, no wheezing, no crackles, no rhonchi Abdominal: Soft, (+) BS, no tenderness to palpation  Extremities: no cyanosis, pulses palpable bilaterally DP and PT Neuro: Grossly nonfocal  Discharge Instructions  Discharge Instructions    Call MD for:  persistant nausea and vomiting   Complete by:  As directed    Call MD for:  redness, tenderness, or signs of infection (pain, swelling, redness, odor  or green/yellow discharge around incision site)   Complete by:  As directed    Call MD for:  severe uncontrolled pain   Complete by:  As directed    Diet - low sodium heart healthy   Complete by:  As directed    Discharge instructions   Complete by:  As directed    Continue Augmentin for 12 more days on discharge Follow up with PCP In 1-2 weeks after discharge   Increase activity slowly   Complete by:  As directed      Allergies as of 08/28/2017      Reactions    Amlodipine Swelling   edema   Clonidine Derivatives Swelling   Limbs swell   Welchol [colesevelam Hcl] Nausea Only      Medication List    STOP taking these medications   heparin 1000 UNIT/ML injection     TAKE these medications   acetaminophen 500 MG tablet Commonly known as:  TYLENOL Take 1,000 mg by mouth every 6 (six) hours as needed (for pain).   amoxicillin-clavulanate 500-125 MG tablet Commonly known as:  AUGMENTIN Take 1 tablet (500 mg total) by mouth daily for 12 days.   AURYXIA 1 GM 210 MG(Fe) tablet Generic drug:  ferric citrate TAKE 1 TABLET BY MOUTH THREE TIMES A DAY WITH MEALS.   SWALLOW WHOLE, DO NOT CHEW OR CRUSH MEDICATION   calcium acetate 667 MG capsule Commonly known as:  PHOSLO Take 2 capsules (1,334 mg total) by mouth 3 (three) times daily with meals.   carvedilol 6.25 MG tablet Commonly known as:  COREG Take 6.25 mg by mouth 2 (two) times daily with a meal.   cyclobenzaprine 10 MG tablet Commonly known as:  FLEXERIL Take 0.5-1 tablets (5-10 mg total) by mouth 2 (two) times daily as needed for muscle spasms.   feeding supplement (PRO-STAT SUGAR FREE 64) Liqd Take 30 mLs by mouth 2 (two) times daily.   folic acid 1 MG tablet Commonly known as:  FOLVITE Take 1 tablet (1 mg total) by mouth daily.   gentamicin cream 0.1 % Commonly known as:  GARAMYCIN APPLY TO EXIT SITE DAILY AS DIRECTED   irbesartan 300 MG tablet Commonly known as:  AVAPRO TAKE 1 TABLET BY MOUTH EVERYDAY AT BEDTIME   multivitamin Tabs tablet Take 1 tablet by mouth at bedtime.   polyethylene glycol packet Commonly known as:  MIRALAX / GLYCOLAX Take 17 g by mouth daily as needed. What changed:  reasons to take this   thiamine 100 MG tablet Commonly known as:  VITAMIN B-1 Take 1 tablet (100 mg total) by mouth daily.      Follow-up Information    [name], MD. Schedule an appointment as soon as possible for a visit in 1 week(s).   Specialty:  Internal  Medicine Contact information: [address] [place] [address] [phone]            The results of significant diagnostics from this hospitalization (including imaging, microbiology, ancillary and laboratory) are listed below for reference.    Significant Diagnostic Studies: Ct Abdomen Pelvis W Contrast  Result Date: 08/25/2017 CLINICAL DATA:  Crampy abdominal pain. EXAM: CT ABDOMEN AND PELVIS WITH CONTRAST TECHNIQUE: Multidetector CT imaging of the abdomen and pelvis was performed using the standard protocol following bolus administration of intravenous contrast. CONTRAST:  18mL OMNIPAQUE IOHEXOL 300 MG/ML  SOLN COMPARISON:  CT abdomen pelvis 02/05/2015 FINDINGS: LOWER CHEST: No basilar pulmonary nodules or pleural effusion.  No apical pericardial effusion. HEPATOBILIARY: Normal hepatic contours and density. No intra- or extrahepatic biliary dilatation. Normal gallbladder. PANCREAS: Normal parenchymal contours without ductal dilatation. No peripancreatic fluid collection. SPLEEN: Normal. ADRENALS/URINARY TRACT: --Adrenal glands: Normal. --Right kidney/ureter: Atrophic kidney --Left kidney/ureter: The atrophic kidney --Urinary bladder: Normal for degree of distention. There is a peritoneal dialysis catheter coiled between the uterus and urinary bladder. STOMACH/BOWEL: --Stomach/Duodenum: Postsurgical changes of the stomach. Normal duodenal course. --Small bowel: No dilatation or inflammation. --Colon: No focal abnormality. --Appendix: Normal. VASCULAR/LYMPHATIC: Atherosclerotic calcification is present within the non-aneurysmal abdominal aorta, without hemodynamically significant stenosis. The portal vein, splenic vein, superior mesenteric vein and IVC are patent. No abdominal or pelvic lymphadenopathy. REPRODUCTIVE: Normal uterus.  No adnexal mass. MUSCULOSKELETAL. No bony spinal canal stenosis or focal osseous abnormality. OTHER: Inflammatory stranding surrounding a  fat-containing focus in the right upper quadrant. IMPRESSION: 1. Fat-containing area of inflammatory stranding in the right upper quadrant is most consistent with omental infarction. 2. No pancreatic abnormality. 3.  Aortic Atherosclerosis (ICD10-I70.0). Electronically Signed   By: Ulyses Jarred M.D.   On: 08/25/2017 02:38   Dg Abd Acute W/chest  Result Date: 08/24/2017 CLINICAL DATA:  Diarrhea and generalized stomach pain, nausea, and vomiting for 2 weeks, former smoker, history hypertension, end-stage renal disease on dialysis, type II diabetes mellitus EXAM: DG ABDOMEN ACUTE W/ 1V CHEST COMPARISON:  Chest radiographs 05/21/2017, CT abdomen and pelvis 02/05/2015 FINDINGS: Upper normal heart size. Mediastinal contours and pulmonary vascularity normal. Atherosclerotic calcification at aorta Lungs clear. No pulmonary infiltrate, pleural effusion or pneumothorax. Bones demineralized. Surgical clips/staples from history of gastric sleeve resection. Peritoneal dialysis catheter. Bowel gas pattern normal. No bowel dilatation bowel wall thickening, or free air. Osseous structures unremarkable. Calcification in LEFT mid abdomen is likely a tiny calcified lymph node by prior CT. IMPRESSION: Normal bowel gas pattern. Clear lungs. No acute abnormalities. Electronically Signed   By: Lavonia Dana M.D.   On: 08/24/2017 11:21   US Abdomen Limited Ruq  Result Date: 08/25/2017 CLINICAL DATA:  Pancreatitis. EXAM: ULTRASOUND ABDOMEN LIMITED RIGHT UPPER QUADRANT COMPARISON:  None. FINDINGS: Gallbladder: No wall thickening visualized. There is sludge in the gallbladder. No sonographic Murphy sign noted by sonographer. Common bile duct: Diameter: 4.5 mm Liver: No focal lesion identified. There is mild diffuse increased echotexture of the liver. Portal vein is patent on color Doppler imaging with normal direction of blood flow towards the liver. IMPRESSION: Sludge in the gallbladder. No sonographic evidence of acute cholecystitis.  Electronically Signed   By: Abelardo Diesel M.D.   On: 08/25/2017 14:59    Microbiology: No results found for this or any previous visit (from the past 240 hour(s)).   Labs: Basic Metabolic Panel: Recent Labs  Lab 08/24/17 1201 08/24/17 1719 08/25/17 0608 08/26/17 0517 08/28/17 0421  NA 132* 136  --  136 135  K 4.6 4.4  --  4.2 3.8  CL 92* 96*  --  96* 96*  CO2 27 26  --  25 24  GLUCOSE 444* 394*  --  191* 187*  BUN 59* 60*  --  59* 54*  CREATININE 13.56* 14.37* 14.27* 14.19* 15.12*  CALCIUM 9.2 8.9  --  8.8* 8.6*   Liver Function Tests: Recent Labs  Lab 08/24/17 1201 08/24/17 1719 08/26/17 0517  AST 8 12* 16  ALT 10 14 12   ALKPHOS 76 80 65  BILITOT 0.4 0.8 0.7  PROT 7.1 6.6 5.8*  ALBUMIN 3.7 3.2* 2.7*   Recent Labs  Lab  08/24/17 1201 08/24/17 1719 08/25/17 1130 08/26/17 0514 08/27/17 1202 08/28/17 0421  LIPASE 884.0* 682*  --  344* 213* 165*  AMYLASE 59  --  92  --   --   --    No results for input(s): AMMONIA in the last 168 hours. CBC: Recent Labs  Lab 08/24/17 1201 08/24/17 1719 08/25/17 0608 08/26/17 0517  WBC 8.3 9.8 8.3 8.1  NEUTROABS 6.1  --   --   --   HGB 10.3* 10.0* 9.5* 9.3*  HCT 31.3* 31.6* 30.3* 29.6*  MCV 98.4 100.3* 100.7* 99.7  PLT 322.0 337 279 310   Cardiac Enzymes: Recent Labs  Lab 08/25/17 0608  TROPONINI <0.03   BNP: BNP (last 3 results) No results for input(s): BNP in the last 8760 hours.  ProBNP (last 3 results) No results for input(s): PROBNP in the last 8760 hours.  CBG: Recent Labs  Lab 08/27/17 1708 08/27/17 2017 08/28/17 0135 08/28/17 0352 08/28/17 0739  GLUCAP 126* 148* 214* 179* 172*

## 2017-09-03 ENCOUNTER — Ambulatory Visit (INDEPENDENT_AMBULATORY_CARE_PROVIDER_SITE_OTHER): Payer: 59 | Admitting: Family

## 2017-09-03 ENCOUNTER — Encounter (HOSPITAL_COMMUNITY): Payer: Self-pay | Admitting: Emergency Medicine

## 2017-09-03 ENCOUNTER — Other Ambulatory Visit: Payer: Self-pay

## 2017-09-03 ENCOUNTER — Other Ambulatory Visit (INDEPENDENT_AMBULATORY_CARE_PROVIDER_SITE_OTHER): Payer: 59

## 2017-09-03 ENCOUNTER — Emergency Department (HOSPITAL_COMMUNITY): Payer: 59

## 2017-09-03 ENCOUNTER — Encounter: Payer: Self-pay | Admitting: Family

## 2017-09-03 ENCOUNTER — Emergency Department (HOSPITAL_COMMUNITY)
Admission: EM | Admit: 2017-09-03 | Discharge: 2017-09-04 | Disposition: A | Discharge status: Home / Self Care | Payer: 59 | Attending: Emergency Medicine | Admitting: Emergency Medicine

## 2017-09-03 VITALS — BP 128/90 | HR 82 | Temp 98.0°F | Ht 65.0 in | Wt 284.0 lb

## 2017-09-03 DIAGNOSIS — R531 Weakness: Secondary | ICD-10-CM | POA: Diagnosis present

## 2017-09-03 DIAGNOSIS — Z9884 Bariatric surgery status: Secondary | ICD-10-CM | POA: Insufficient documentation

## 2017-09-03 DIAGNOSIS — Z79899 Other long term (current) drug therapy: Secondary | ICD-10-CM | POA: Insufficient documentation

## 2017-09-03 DIAGNOSIS — K828 Other specified diseases of gallbladder: Secondary | ICD-10-CM | POA: Diagnosis not present

## 2017-09-03 DIAGNOSIS — Z992 Dependence on renal dialysis: Secondary | ICD-10-CM | POA: Diagnosis not present

## 2017-09-03 DIAGNOSIS — N186 End stage renal disease: Secondary | ICD-10-CM | POA: Insufficient documentation

## 2017-09-03 DIAGNOSIS — I120 Hypertensive chronic kidney disease with stage 5 chronic kidney disease or end stage renal disease: Secondary | ICD-10-CM | POA: Diagnosis not present

## 2017-09-03 DIAGNOSIS — E1122 Type 2 diabetes mellitus with diabetic chronic kidney disease: Secondary | ICD-10-CM | POA: Diagnosis not present

## 2017-09-03 DIAGNOSIS — E1121 Type 2 diabetes mellitus with diabetic nephropathy: Secondary | ICD-10-CM

## 2017-09-03 DIAGNOSIS — K859 Acute pancreatitis without necrosis or infection, unspecified: Secondary | ICD-10-CM | POA: Diagnosis not present

## 2017-09-03 DIAGNOSIS — Z87891 Personal history of nicotine dependence: Secondary | ICD-10-CM | POA: Diagnosis not present

## 2017-09-03 DIAGNOSIS — R748 Abnormal levels of other serum enzymes: Secondary | ICD-10-CM

## 2017-09-03 LAB — CBC
HCT: 29.6 % — ABNORMAL LOW (ref 36.0–46.0)
Hemoglobin: 9.7 g/dL — ABNORMAL LOW (ref 12.0–15.0)
MCH: 31.2 pg (ref 26.0–34.0)
MCHC: 32.8 g/dL (ref 30.0–36.0)
MCV: 95.2 fL (ref 78.0–100.0)
Platelets: 430 10*3/uL — ABNORMAL HIGH (ref 150–400)
RBC: 3.11 MIL/uL — ABNORMAL LOW (ref 3.87–5.11)
RDW: 13.5 % (ref 11.5–15.5)
WBC: 6.8 10*3/uL (ref 4.0–10.5)

## 2017-09-03 LAB — COMPREHENSIVE METABOLIC PANEL
ALT: 5 U/L (ref 0–35)
ALT: 9 U/L (ref 0–44)
AST: 9 U/L (ref 0–37)
AST: 13 U/L — ABNORMAL LOW (ref 15–41)
Albumin: 3.5 g/dL (ref 3.5–5.2)
Albumin: 2.9 g/dL — ABNORMAL LOW (ref 3.5–5.0)
Alkaline Phosphatase: 74 U/L (ref 39–117)
Alkaline Phosphatase: 74 U/L (ref 38–126)
Anion gap: 18 — ABNORMAL HIGH (ref 5–15)
BUN: 63 mg/dL — ABNORMAL HIGH (ref 6–23)
BUN: 63 mg/dL — ABNORMAL HIGH (ref 6–20)
CO2: 28 mEq/L (ref 19–32)
CO2: 25 mmol/L (ref 22–32)
Calcium: 8.7 mg/dL (ref 8.4–10.5)
Calcium: 8.2 mg/dL — ABNORMAL LOW (ref 8.9–10.3)
Chloride: 93 mEq/L — ABNORMAL LOW (ref 96–112)
Chloride: 95 mmol/L — ABNORMAL LOW (ref 98–111)
Creatinine, Ser: 16.56 mg/dL (ref 0.40–1.20)
Creatinine, Ser: 17.33 mg/dL — ABNORMAL HIGH (ref 0.44–1.00)
GFR calc Af Amer: 2 mL/min — ABNORMAL LOW (ref >60)
GFR calc non Af Amer: 2 mL/min — ABNORMAL LOW (ref >60)
GFR: 2.93 mL/min — CL (ref >60.00)
Glucose, Bld: 277 mg/dL — ABNORMAL HIGH (ref 70–99)
Glucose, Bld: 272 mg/dL — ABNORMAL HIGH (ref 70–99)
Potassium: 4.1 mEq/L (ref 3.5–5.1)
Potassium: 4 mmol/L (ref 3.5–5.1)
Sodium: 136 mEq/L (ref 135–145)
Sodium: 138 mmol/L (ref 135–145)
Total Bilirubin: 0.4 mg/dL (ref 0.2–1.2)
Total Bilirubin: 0.5 mg/dL (ref 0.3–1.2)
Total Protein: 7 g/dL (ref 6.0–8.3)
Total Protein: 6.9 g/dL (ref 6.5–8.1)

## 2017-09-03 LAB — URINALYSIS, ROUTINE W REFLEX MICROSCOPIC
Glucose, UA: NEGATIVE mg/dL
Hgb urine dipstick: NEGATIVE
Ketones, ur: NEGATIVE mg/dL
Leukocytes, UA: NEGATIVE
Nitrite: NEGATIVE
Protein, ur: 100 mg/dL — AB
Specific Gravity, Urine: >1.03 — ABNORMAL HIGH (ref 1.005–1.030)
pH: 5.5 (ref 5.0–8.0)

## 2017-09-03 LAB — CBC WITH DIFFERENTIAL/PLATELET
Basophils Absolute: 0 10*3/uL (ref 0.0–0.1)
Basophils Relative: 0.3 % (ref 0.0–3.0)
Eosinophils Absolute: 0.2 10*3/uL (ref 0.0–0.7)
Eosinophils Relative: 3.3 % (ref 0.0–5.0)
HCT: 30.9 % — ABNORMAL LOW (ref 36.0–46.0)
Hemoglobin: 10 g/dL — ABNORMAL LOW (ref 12.0–15.0)
Lymphocytes Relative: 17.2 % (ref 12.0–46.0)
Lymphs Abs: 1.1 10*3/uL (ref 0.7–4.0)
MCHC: 32.5 g/dL (ref 30.0–36.0)
MCV: 95.9 fl (ref 78.0–100.0)
Monocytes Absolute: 0.9 10*3/uL (ref 0.1–1.0)
Monocytes Relative: 13.5 % — ABNORMAL HIGH (ref 3.0–12.0)
Neutro Abs: 4.3 10*3/uL (ref 1.4–7.7)
Neutrophils Relative %: 65.7 % (ref 43.0–77.0)
Platelets: 385 10*3/uL (ref 150.0–400.0)
RBC: 3.22 Mil/uL — ABNORMAL LOW (ref 3.87–5.11)
RDW: 14.3 % (ref 11.5–15.5)
WBC: 6.6 10*3/uL (ref 4.0–10.5)

## 2017-09-03 LAB — URINALYSIS, MICROSCOPIC (REFLEX)

## 2017-09-03 LAB — LIPASE: Lipase: 369 U/L — ABNORMAL HIGH (ref 11.0–59.0)

## 2017-09-03 LAB — POCT GLUCOSE (DEVICE FOR HOME USE)
Glucose Fasting, POC: 320 mg/dL — AB (ref 70–99)
POC Glucose: 320 mg/dl — AB (ref 70–99)

## 2017-09-03 LAB — LIPASE, BLOOD: Lipase: 186 U/L — ABNORMAL HIGH (ref 11–51)

## 2017-09-03 LAB — AMYLASE: Amylase: 27 U/L (ref 27–131)

## 2017-09-03 NOTE — ED Triage Notes (Signed)
Patient is complaining of right upper quad abdominal pain that started about 3 weeks ago. Patient states she is nauseated, vomiting, and having diarhea. Patient states she went to the doctor and labs was abnormal.

## 2017-09-03 NOTE — Progress Notes (Signed)
Brenda Davis is a 52 y.o. female with the following history as recorded in EpicCare:  Patient Active Problem List   Diagnosis Date Noted  . Acute pancreatitis 08/25/2017  . Omental infarction ([place]) 08/25/2017  . ESRD on peritoneal dialysis ([place]) 08/25/2017  . DM (diabetes mellitus), type 2 with renal complications ([place]) 62/37/6283  . Acute cystitis without hematuria   . Pain of upper abdomen 08/24/2017  . Deficiency anemia 08/24/2017  . Diarrhea of infectious origin 08/24/2017  . Elevated lipase 08/24/2017  . Chronic heart failure with preserved ejection fraction ([place]) 06/22/2017  . Allergic rhinitis 06/27/2016  . Severe persistent asthma with exacerbation 05/12/2016  . Anemia due to chronic kidney disease 05/06/2016  . Routine general medical examination at a health care facility 12/05/2015  . Cervical cancer screening 12/05/2015  . Visit for screening mammogram 01/23/2015  . Sinus pause 01/04/2015  . Hyperglycemia 12/31/2014  . Gout due to renal impairment 09/13/2014  . Thiamine deficiency 12/02/2012  . Meralgia paraesthetica 11/28/2012  . S/P laparoscopic sleeve gastrectomy 11/28/2012  . B12 deficiency anemia 11/28/2012  . Intractable vomiting with nausea 09/26/2012  . Hyperlipidemia with target LDL less than 100 04/09/2008  . CKD (chronic kidney disease) stage V requiring chronic dialysis (MWF) 02/08/2008  . HYPERTENSION, BENIGN ESSENTIAL 01/20/1999    Current Outpatient Medications  Medication Sig Dispense Refill  . acetaminophen (TYLENOL) 500 MG tablet Take 1,000 mg by mouth every 6 (six) hours as needed (for pain).    . Amino Acids-Protein Hydrolys (FEEDING SUPPLEMENT, PRO-STAT SUGAR FREE 64,) LIQD Take 30 mLs by mouth 2 (two) times daily. 900 mL 0  . amoxicillin-clavulanate (AUGMENTIN) 500-125 MG tablet Take 1 tablet (500 mg total) by mouth daily for 12 days. 12 tablet 0  . AURYXIA 1 GM 210 MG(Fe) tablet TAKE 1 TABLET BY MOUTH THREE TIMES A DAY WITH MEALS.    SWALLOW WHOLE, DO NOT CHEW OR CRUSH MEDICATION  3  . calcium acetate (PHOSLO) 667 MG capsule Take 2 capsules (1,334 mg total) by mouth 3 (three) times daily with meals. 180 capsule 0  . carvedilol (COREG) 6.25 MG tablet Take 6.25 mg by mouth 2 (two) times daily with a meal.  12  . cyclobenzaprine (FLEXERIL) 10 MG tablet Take 0.5-1 tablets (5-10 mg total) by mouth 2 (two) times daily as needed for muscle spasms. 20 tablet 0  . folic acid (FOLVITE) 1 MG tablet Take 1 tablet (1 mg total) by mouth daily. 90 tablet 1  . gentamicin cream (GARAMYCIN) 0.1 % APPLY TO EXIT SITE DAILY AS DIRECTED  4  . irbesartan (AVAPRO) 300 MG tablet TAKE 1 TABLET BY MOUTH EVERYDAY AT BEDTIME 90 tablet 0  . multivitamin (RENA-VIT) TABS tablet Take 1 tablet by mouth at bedtime. 30 tablet 0  . polyethylene glycol (MIRALAX / GLYCOLAX) packet Take 17 g by mouth daily as needed. (Patient taking differently: Take 17 g by mouth daily as needed for mild constipation. ) 14 each 0  . thiamine (VITAMIN B-1) 100 MG tablet Take 1 tablet (100 mg total) by mouth daily. 90 tablet 1   No current facility-administered medications for this visit.     Allergies: Amlodipine; Clonidine derivatives; and Welchol [colesevelam hcl]  Past Medical History:  Diagnosis Date  . Anemia   . Chronic kidney disease    STAGE 3 CHRONIC KIDNEY DISEASE SECONDARY TO DIABETIC GLOMERULOSCLEROSIS AND UNCONTROLLED HYPERTENSION - PER OFFICE NOTES DR[facility]  Marland Kitchen ESRD on dialysis [place])    "  MWF; [address] (05/06/2016)  . Gout   . HCAP (healthcare-associated pneumonia) 05/06/2016  . History of blood transfusion    "related to surgery"  . Hyperlipidemia   . Hypertension   . Morbid obesity ([place])   . Pain    LEFT SHOULDER PAIN - WAS SEEN AT AN URGENT CARE - GIVEN SLING FOR COMFORT AND TOLD ROM AS TOLERATED.  Marland Kitchen Palpitations 09/24/2016  . Type II diabetes mellitus ([place])    "gastric sleeve OR corrected this" (05/06/2016)    Past Surgical  History:  Procedure Laterality Date  . AV FISTULA PLACEMENT Left 01/03/2015   Procedure: BRACHIAL CEPHALIC ARTERIOVENOUS  FISTULA CREATION LEFT ARM;  Surgeon: Angelia Mould, MD;  Location: [facility];  Service: Vascular;  Laterality: Left;  . CARPAL TUNNEL RELEASE Right   . [place]  . ESOPHAGOGASTRODUODENOSCOPY N/A 09/04/2012   Procedure: ESOPHAGOGASTRODUODENOSCOPY (EGD);  Surgeon: Shann Medal, MD;  Location: [facility];  Service: General;  Laterality: N/A;  PF  . ESOPHAGOGASTRODUODENOSCOPY (EGD) WITH ESOPHAGEAL DILATION N/A 09/29/2012   Procedure: ESOPHAGOGASTRODUODENOSCOPY (EGD) WITH ESOPHAGEAL DILATION;  Surgeon: Milus Banister, MD;  [facility];  Service: Endoscopy;  Laterality: N/A;  . INSERTION OF DIALYSIS CATHETER N/A 01/03/2015   Procedure: INSERTION OF DIALYSIS CATHETER RIGHT INTERNAL JUGULAR;  Surgeon: Angelia Mould, MD;  Location: [facility];  Service: Vascular;  Laterality: N/A;  . LAPAROSCOPIC GASTRIC SLEEVE RESECTION N/A 07/19/2012   Procedure: LAPAROSCOPIC SLEEVE GASTRECTOMY with EGD;  Surgeon: Madilyn Hook, DO;  [facility];  Service: General;  Laterality: N/A;  laparoscopic sleeve gastrectomy with EGD  . PERIPHERAL VASCULAR CATHETERIZATION Left 05/23/2015   Procedure: Nolon Stalls;  Surgeon: Conrad [place], MD;  Location: [facility];  Service: Cardiovascular;  Laterality: Left;  upper aRM  . PERIPHERAL VASCULAR CATHETERIZATION Left 05/23/2015   Procedure: Peripheral Vascular Balloon Angioplasty;  Surgeon: Conrad , MD;  Location: [facility];  Service: Cardiovascular;  Laterality: Left;  av fistula  . TUBAL LIGATION  1993  . UPPER GI ENDOSCOPY N/A 07/19/2012   Procedure: UPPER GI ENDOSCOPY;  Surgeon: Madilyn Hook, DO;  [facility];  Service: General;  Laterality: N/A;    Family History  Problem Relation Age of Onset  . Hypertension Mother   . Mitral valve prolapse Mother   . Diabetes Father   . Arthritis Other    . Hyperlipidemia Other   . Cancer Neg Hx   . Heart disease Neg Hx   . Kidney disease Neg Hx   . Stroke Neg Hx     Social History   Tobacco Use  . Smoking status: Former Smoker    Packs/day: 1.00    Years: 16.00    Pack years: 16.00    Types: Cigarettes    Last attempt to quit: 2009    Years since quitting: 10.6  . Smokeless tobacco: Never Used  Substance Use Topics  . Alcohol use: Never    Frequency: Never    Subjective:  Patient was seen on 08/24/2017 with abdominal pain, nausea, vomiting; admitted to the hospital with acute pancreatitis and found to have omental infarction; ESRD- does daily PD at home/ labs have not been checked since August 6; patient is concerned about her blood sugar/ lipase level;  Her nephrologist has scheduled her to see surgeon on Monday to follow-up on omental infarction.  History of Type 2 Diabetes- resolved after gastric sleeve; has not had to take any medication since 2014;  does not have a meter at home; has done nothing out of the ordinary to explain sudden onset of elevated blood sugars; Currently on Augmentin twice a day to treat the acute pancreatitis; notes she has taken this in the past and never experienced nausea/ vomiting on this antibiotic before; No formed stools since early August- per patient, acute nausea/ decreased appetite; Does still have gallbladder- recent RUQ ultrasound showed sludge but no cholecystitis  Objective:  Vitals:   09/03/17 1400  BP: 128/90  Pulse: 82  Temp: 98 F (36.7 C)  TempSrc: Oral  SpO2: 98%  Weight: 284 lb (128.8 kg)  Height: 5' 5"  (1.651 m)    General: Well developed, well nourished, in no acute distress  Skin : Warm and dry.  Head: Normocephalic and atraumatic  Eyes: Sclera and conjunctiva clear/ ? Early jaundice; pupils round and reactive to light; extraocular movements intact  Ears: External normal; canals clear; tympanic membranes normal  Oropharynx: Pink, supple. No suspicious lesions  Neck:  Supple without thyromegaly, adenopathy  Lungs: Respirations unlabored; clear to auscultation bilaterally without wheeze, rales, rhonchi  CVS exam: normal rate and regular rhythm.  Abdomen: Soft; nontender; nondistended; normoactive bowel sounds; no masses or hepatosplenomegaly  Neurologic: Alert and oriented; speech intact; face symmetrical; moves all extremities well; CNII-XII intact without focal deficit   Assessment:  1. Acute pancreatitis, unspecified complication status, unspecified pancreatitis type   2. Type 2 diabetes mellitus with diabetic nephropathy, without long-term current use of insulin (HCC)     Plan:  Based on clinical presentation and persisting symptoms of nausea/ sludge noted on recent ultrasound, am concerned that she does have gallbladder disease which is in turn causing the symptoms/ pancreatitis; will update STAT labs today and will most likely send back to hospital- ? If patient needs HIDA scan; follow-up to be determined based on lab results.    No follow-ups on file.  Orders Placed This Encounter  Procedures  . Lipase    Standing Status:   Future    Number of Occurrences:   1    Standing Expiration Date:   09/03/2018  . Amylase    Standing Status:   Future    Number of Occurrences:   1    Standing Expiration Date:   09/03/2018  . Comp Met (CMET)    Standing Status:   Future    Number of Occurrences:   1    Standing Expiration Date:   09/03/2018  . CBC w/Diff    Standing Status:   Future    Number of Occurrences:   1    Standing Expiration Date:   09/03/2018  . POCT Glucose (Device for Home Use)    Requested Prescriptions    No prescriptions requested or ordered in this encounter

## 2017-09-03 NOTE — ED Provider Notes (Signed)
[facility] DEPT Provider Note   CSN: [id number] Arrival date & time: 09/03/17  4431     History   Chief Complaint Chief Complaint  Patient presents with  . Abdominal Pain    HPI Brenda Davis is a 52 y.o. female.  Patient with past medical history remarkable for ESRD, on daily peritoneal dialysis, diabetes, presents to the emergency department with a chief complaint of generalized weakness and abdominal pain.  She was seen approximately 1 week ago and was diagnosed with pancreatitis.  She states that she has had no improvement of her symptoms.  She reports feeling nauseated, but has not had vomiting.  She mainly complains of pain and fatigue.  She states that she was seen by her regular doctor today, and was encouraged to come to the hospital.  During her recent admission she had CT and ultrasound of her abdomen which showed gallbladder sludge as well as possible omental infarct.  Patient states that her pain has gradually worsened and is more located in the right upper quadrant.  She denies any fever or chills.  She denies any other associated symptoms.  Her symptoms are aggravated with palpation and movement.  The history is provided by the patient. No language interpreter was used.    Past Medical History:  Diagnosis Date  . Anemia   . Chronic kidney disease    STAGE 3 CHRONIC KIDNEY DISEASE SECONDARY TO DIABETIC GLOMERULOSCLEROSIS AND UNCONTROLLED HYPERTENSION - PER OFFICE NOTES DR. [facility].  Marland Kitchen ESRD on dialysis [facility])    "MWF; [address] (05/06/2016)  . Gout   . HCAP (healthcare-associated pneumonia) 05/06/2016  . History of blood transfusion    "related to surgery"  . Hyperlipidemia   . Hypertension   . Morbid obesity ([place])   . Pain    LEFT SHOULDER PAIN - WAS SEEN AT AN URGENT CARE - GIVEN SLING FOR COMFORT AND TOLD ROM AS TOLERATED.  Marland Kitchen Palpitations 09/24/2016  . Type II diabetes mellitus ([place])    "gastric sleeve OR corrected this" (05/06/2016)    Patient Active Problem List   Diagnosis Date Noted  . Acute pancreatitis 08/25/2017  . Omental infarction ([place]) 08/25/2017  . ESRD on peritoneal dialysis ([place]) 08/25/2017  . DM (diabetes mellitus), type 2 with renal complications ([place]) 54/00/8676  . Acute cystitis without hematuria   . Pain of upper abdomen 08/24/2017  . Deficiency anemia 08/24/2017  . Diarrhea of infectious origin 08/24/2017  . Elevated lipase 08/24/2017  . Chronic heart failure with preserved ejection fraction ([place]) 06/22/2017  . Allergic rhinitis 06/27/2016  . Severe persistent asthma with exacerbation 05/12/2016  . Anemia due to chronic kidney disease 05/06/2016  . Routine general medical examination at a health care facility 12/05/2015  . Cervical cancer screening 12/05/2015  . Visit for screening mammogram 01/23/2015  . Sinus pause 01/04/2015  . Hyperglycemia 12/31/2014  . Gout due to renal impairment 09/13/2014  . Thiamine deficiency 12/02/2012  . Meralgia paraesthetica 11/28/2012  . S/P laparoscopic sleeve gastrectomy 11/28/2012  . B12 deficiency anemia 11/28/2012  . Intractable vomiting with nausea 09/26/2012  . Hyperlipidemia with target LDL less than 100 04/09/2008  . CKD (chronic kidney disease) stage V requiring chronic dialysis (MWF) 02/08/2008  . HYPERTENSION, BENIGN ESSENTIAL 01/20/1999    Past Surgical History:  Procedure Laterality Date  . AV FISTULA PLACEMENT Left 01/03/2015   Procedure: BRACHIAL CEPHALIC ARTERIOVENOUS  FISTULA CREATION LEFT ARM;  Surgeon: Angelia Mould, MD;  Location: [facility];  Service: Vascular;  Laterality: Left;  . CARPAL TUNNEL RELEASE Right   . [place]  . ESOPHAGOGASTRODUODENOSCOPY N/A 09/04/2012   Procedure: ESOPHAGOGASTRODUODENOSCOPY (EGD);  Surgeon: Shann Medal, MD;  [facility];  Service: General;  Laterality: N/A;  PF  . ESOPHAGOGASTRODUODENOSCOPY (EGD) WITH ESOPHAGEAL  DILATION N/A 09/29/2012   Procedure: ESOPHAGOGASTRODUODENOSCOPY (EGD) WITH ESOPHAGEAL DILATION;  Surgeon: Milus Banister, MD;  [facility];  Service: Endoscopy;  Laterality: N/A;  . INSERTION OF DIALYSIS CATHETER N/A 01/03/2015   Procedure: INSERTION OF DIALYSIS CATHETER RIGHT INTERNAL JUGULAR;  Surgeon: Angelia Mould, MD;  Location: [facility];  Service: Vascular;  Laterality: N/A;  . LAPAROSCOPIC GASTRIC SLEEVE RESECTION N/A 07/19/2012   Procedure: LAPAROSCOPIC SLEEVE GASTRECTOMY with EGD;  Surgeon: Madilyn Hook, DO;  [facility];  Service: General;  Laterality: N/A;  laparoscopic sleeve gastrectomy with EGD  . PERIPHERAL VASCULAR CATHETERIZATION Left 05/23/2015   Procedure: Nolon Stalls;  Surgeon: Conrad [place], MD;  Location: [facility];  Service: Cardiovascular;  Laterality: Left;  upper aRM  . PERIPHERAL VASCULAR CATHETERIZATION Left 05/23/2015   Procedure: Peripheral Vascular Balloon Angioplasty;  Surgeon: Conrad [place], MD;  Location: [facility];  Service: Cardiovascular;  Laterality: Left;  av fistula  . TUBAL LIGATION  1993  . UPPER GI ENDOSCOPY N/A 07/19/2012   Procedure: UPPER GI ENDOSCOPY;  Surgeon: Madilyn Hook, DO;  [facility];  Service: General;  Laterality: N/A;     OB History   None      Home Medications    Prior to Admission medications   Medication Sig Start Date End Date Taking? Authorizing Provider  AURYXIA 1 GM 210 MG(Fe) tablet Take 210 mg by mouth 3 (three) times daily with meals.  04/07/17  Yes [provider]  calcium acetate (PHOSLO) 667 MG capsule Take 2 capsules (1,334 mg total) by mouth 3 (three) times daily with meals. 01/07/15  Yes Eugenie Filler, MD  carvedilol (COREG) 6.25 MG tablet Take 6.25 mg by mouth 2 (two) times daily with a meal. 07/07/17  Yes [provider]  Cyanocobalamin (B-12 PO) Take 1 tablet by mouth daily.   Yes [provider]  folic acid (FOLVITE) 1 MG tablet Take 1 tablet (1  mg total) by mouth daily. 06/21/17  Yes [name], MD  gentamicin cream (GARAMYCIN) 0.1 % APPLY TO EXIT SITE DAILY AS DIRECTED 04/26/17  Yes [provider]  multivitamin (RENA-VIT) TABS tablet Take 1 tablet by mouth at bedtime. 01/07/15  Yes Eugenie Filler, MD  thiamine (VITAMIN B-1) 100 MG tablet Take 1 tablet (100 mg total) by mouth daily. 06/21/17  Yes [name], MD  Amino Acids-Protein Hydrolys (FEEDING SUPPLEMENT, PRO-STAT SUGAR FREE 64,) LIQD Take 30 mLs by mouth 2 (two) times daily. Patient not taking: Reported on 09/03/2017 08/28/17   Robbie Lis, MD  amoxicillin-clavulanate (AUGMENTIN) 500-125 MG tablet Take 1 tablet (500 mg total) by mouth daily for 12 days. Patient not taking: Reported on 09/03/2017 08/28/17 09/09/17  Robbie Lis, MD  cyclobenzaprine (FLEXERIL) 10 MG tablet Take 0.5-1 tablets (5-10 mg total) by mouth 2 (two) times daily as needed for muscle spasms. Patient not taking: Reported on 09/03/2017 05/21/17   Margarita Mail, PA-C  irbesartan (AVAPRO) 300 MG tablet TAKE 1 TABLET BY MOUTH EVERYDAY AT BEDTIME Patient not taking: Reported on 09/03/2017 06/16/17   [name], MD  polyethylene glycol [facility] / Floria Raveling) packet  Take 17 g by mouth daily as needed. Patient not taking: Reported on 09/03/2017 05/09/16   Eugenie Filler, MD    Family History Family History  Problem Relation Age of Onset  . Hypertension Mother   . Mitral valve prolapse Mother   . Diabetes Father   . Arthritis Other   . Hyperlipidemia Other   . Cancer Neg Hx   . Heart disease Neg Hx   . Kidney disease Neg Hx   . Stroke Neg Hx     Social History Social History   Tobacco Use  . Smoking status: Former Smoker    Packs/day: 1.00    Years: 16.00    Pack years: 16.00    Types: Cigarettes    Last attempt to quit: 2009    Years since quitting: 10.6  . Smokeless tobacco: Never Used  Substance Use Topics  . Alcohol use: Never    Frequency: Never  . Drug use: No      Allergies   Amlodipine; Clonidine derivatives; and Welchol [colesevelam hcl]   Review of Systems Review of Systems  All other systems reviewed and are negative.    Physical Exam Updated Vital Signs BP 123/71 (BP Location: Right Arm)   Pulse 79   Temp 98.6 F (37 C) (Oral)   Resp 20   Ht 5\' 5"  (1.651 m)   Wt 128.8 kg   SpO2 98%   BMI 47.26 kg/m   Physical Exam  Constitutional: She is oriented to person, place, and time. She appears well-developed and well-nourished.  HENT:  Head: Normocephalic and atraumatic.  Eyes: Pupils are equal, round, and reactive to light. Conjunctivae and EOM are normal.  Neck: Normal range of motion. Neck supple.  Cardiovascular: Normal rate and regular rhythm. Exam reveals no gallop and no friction rub.  No murmur heard. Pulmonary/Chest: Effort normal and breath sounds normal. No respiratory distress. She has no wheezes. She has no rales. She exhibits no tenderness.  Abdominal: Soft. Bowel sounds are normal. She exhibits no distension and no mass. There is tenderness in the right upper quadrant. There is no rebound and no guarding.  Musculoskeletal: Normal range of motion. She exhibits no edema or tenderness.  Neurological: She is alert and oriented to person, place, and time.  Skin: Skin is warm and dry.  Psychiatric: She has a normal mood and affect. Her behavior is normal. Judgment and thought content normal.  Nursing note and vitals reviewed.    ED Treatments / Results  Labs (all labs ordered are listed, but only abnormal results are displayed) Labs Reviewed  LIPASE, BLOOD - Abnormal; Notable for the following components:      Result Value   Lipase 186 (*)    All other components within normal limits  COMPREHENSIVE METABOLIC PANEL - Abnormal; Notable for the following components:   Chloride 95 (*)    Glucose, Bld 272 (*)    BUN 63 (*)    Creatinine, Ser 17.33 (*)    Calcium 8.2 (*)    Albumin 2.9 (*)    AST 13 (*)    GFR  calc non Af Amer 2 (*)    GFR calc Af Amer 2 (*)    Anion gap 18 (*)    All other components within normal limits  CBC - Abnormal; Notable for the following components:   RBC 3.11 (*)    Hemoglobin 9.7 (*)    HCT 29.6 (*)    Platelets 430 (*)    All  other components within normal limits  URINALYSIS, ROUTINE W REFLEX MICROSCOPIC - Abnormal; Notable for the following components:   Specific Gravity, Urine >1.030 (*)    Bilirubin Urine SMALL (*)    Protein, ur 100 (*)    All other components within normal limits  URINALYSIS, MICROSCOPIC (REFLEX) - Abnormal; Notable for the following components:   Bacteria, UA RARE (*)    All other components within normal limits    EKG None  Radiology US Abdomen Limited  Result Date: 09/03/2017 CLINICAL DATA:  Right upper quadrant pain for 3 weeks. History of hypertension. End-stage renal disease on dialysis. EXAM: ULTRASOUND ABDOMEN LIMITED RIGHT UPPER QUADRANT COMPARISON:  08/25/2017 FINDINGS: Gallbladder: Layering sludge and tiny stones in the dependent gallbladder. Similar appearance to previous study. No gallbladder wall thickening or edema. Murphy's sign is negative. Common bile duct: Diameter: 5.3 mm, normal Liver: Diffusely increased hepatic parenchymal echotexture likely indicating diffuse fatty infiltration. No focal lesions are identified. Portal vein is patent on color Doppler imaging with normal direction of blood flow towards the liver. IMPRESSION: 1. Layering sludge and tiny stones in the dependent gallbladder similar to previous study. No additional changes to suggest cholecystitis. 2. Diffuse fatty infiltration of the liver. Electronically Signed   By: Lucienne Capers M.D.   On: 09/03/2017 23:33    Procedures Procedures (including critical care time)  Medications Ordered in ED Medications - No data to display   Initial Impression / Assessment and Plan / ED Course  I have reviewed the triage vital signs and the nursing  notes.  Pertinent labs & imaging results that were available during my care of the patient were reviewed by me and considered in my medical decision making (see chart for details).    Patient with right upper abdominal pain.  Seen recently for the same.  Was admitted and noted to have elevated lipase in the 600s.  Today she was seen by her PCP and her lipase was in the 300s, currently it is 186.  Her pain is well controlled, she has not required any pain medicine in the emergency department.  She is not vomiting.  She has follow-up with a general surgeon on Monday.  At this time, do not feel that she requires any further emergent intervention or hospitalization.  Patient understands and agrees with this plan, and will return if her symptoms worsen.  Patient seen by and discussed with Dr. Sedonia Small.  Final Clinical Impressions(s) / ED Diagnoses   Final diagnoses:  Gallbladder sludge  Elevated lipase    ED Discharge Orders         Ordered    HYDROcodone-acetaminophen (NORCO/VICODIN) 5-325 MG tablet  Every 6 hours PRN     09/04/17 0002    ondansetron (ZOFRAN ODT) 4 MG disintegrating tablet  Every 8 hours PRN     09/04/17 0002           Montine Circle, PA-C 09/04/17 0007    Maudie Flakes, MD 09/04/17 [phone]

## 2017-09-03 NOTE — ED Notes (Signed)
Pt states she is on dialysis and rarely produces urine.

## 2017-09-03 NOTE — Progress Notes (Signed)
Reviewed with patient- concern for acute pancreatitis/ ? Gallbladder source; patient agrees to ER evaluation.  ER nurse at [facility] notified.

## 2017-09-04 MED ORDER — ONDANSETRON 4 MG PO TBDP: 4.0000 mg | ORAL_TABLET | Freq: Three times a day (TID) | ORAL | 0 refills | Status: DC | PRN

## 2017-09-04 MED ORDER — HYDROCODONE-ACETAMINOPHEN 5-325 MG PO TABS
1.0000 | ORAL_TABLET | Freq: Four times a day (QID) | ORAL | 0 refills | Status: DC | PRN
Start: 2017-09-04 — End: 2017-09-06

## 2017-09-04 NOTE — Discharge Instructions (Addendum)
Please discuss the ultrasound results with your surgeon.  Please return for fever, persistent vomiting, or worsening pain.   CLINICAL DATA:  Right upper quadrant pain for 3 weeks. History of hypertension. End-stage renal disease on dialysis.  EXAM: ULTRASOUND ABDOMEN LIMITED RIGHT UPPER QUADRANT  COMPARISON:  08/25/2017  FINDINGS: Gallbladder:  Layering sludge and tiny stones in the dependent gallbladder. Similar appearance to previous study. No gallbladder wall thickening or edema. Murphy's sign is negative.  Common bile duct:  Diameter: 5.3 mm, normal  Liver:  Diffusely increased hepatic parenchymal echotexture likely indicating diffuse fatty infiltration. No focal lesions are identified. Portal vein is patent on color Doppler imaging with normal direction of blood flow towards the liver.  IMPRESSION: 1. Layering sludge and tiny stones in the dependent gallbladder similar to previous study. No additional changes to suggest cholecystitis. 2. Diffuse fatty infiltration of the liver.

## 2017-09-06 ENCOUNTER — Other Ambulatory Visit: Payer: Self-pay | Admitting: Internal Medicine

## 2017-09-06 ENCOUNTER — Telehealth: Payer: Self-pay | Admitting: Internal Medicine

## 2017-09-06 DIAGNOSIS — E1129 Type 2 diabetes mellitus with other diabetic kidney complication: Secondary | ICD-10-CM

## 2017-09-06 MED ORDER — INSULIN GLARGINE 300 UNIT/ML ~~LOC~~ SOPN: 30.0000 [IU] | PEN_INJECTOR | Freq: Every day | SUBCUTANEOUS | 1 refills | Status: DC

## 2017-09-06 MED ORDER — INSULIN LISPRO 100 UNIT/ML ~~LOC~~ SOLN: 10.0000 [IU] | Freq: Three times a day (TID) | SUBCUTANEOUS | 3 refills | Status: DC

## 2017-09-06 MED ORDER — INSULIN PEN NEEDLE 32G X 6 MM MISC: 1.0000 | Freq: Four times a day (QID) | 3 refills | Status: DC

## 2017-09-06 NOTE — Telephone Encounter (Signed)
Left vmm for pt. If pt calls back she should go back to ED if her sugar are elevated to 400

## 2017-09-06 NOTE — Telephone Encounter (Signed)
Pt calling back and states that she went to the ER on Friday and they stated that they did nothing for her. She states that her surgeon moved her surgery up to have her gallbladder removed to this Thursday. She states she does not want to go to the ER.

## 2017-09-06 NOTE — Telephone Encounter (Signed)
PCP stated he will send insulin in to her pharmacy.  Pt contacted and informed of same.

## 2017-09-06 NOTE — Telephone Encounter (Signed)
Copied from [place] [id number]. Topic: Quick Communication - See Telephone Encounter >> Sep 06, 2017  3:15 PM Ivar Drape wrote: CRM for notification. See Telephone encounter for: 09/06/17. The hospital told the patient she needs to have surgery Thursday, Aug 22nd to take out her gall bladder because they think thats the problem causing her extremely high sugar levels, but they also want to know what is in affect NOW to get those levels down.  Right now it is around 400.

## 2017-09-07 NOTE — Telephone Encounter (Signed)
Pt states that [facility] told her insulin lispro (HUMALOG) 100 UNIT/ML injection requires prior auth thru insurance. Pharmacy told her that Novalog and one other medication are covered by insurance. Pt will go ahead and pick up the other med sent in yesterday. She said that she isn't sure how to use it. If she can't figure it out she will call back.

## 2017-09-07 NOTE — Telephone Encounter (Signed)
She can come on the nurse schedule if she needs instruction.

## 2017-09-08 NOTE — Telephone Encounter (Signed)
Called patient to let her know.  She said that when she called the pharmacy, they told her that now the other medication needs a PA also.   FYI:She is going in the morning for surgery to have her gallbladder removed.

## 2017-09-13 ENCOUNTER — Inpatient Hospital Stay: Payer: 59 | Admitting: Internal Medicine

## 2017-09-14 HISTORY — PX: CHOLECYSTECTOMY: SHX55

## 2017-09-28 ENCOUNTER — Ambulatory Visit: Payer: 59 | Admitting: Internal Medicine

## 2017-09-28 DIAGNOSIS — Z0289 Encounter for other administrative examinations: Secondary | ICD-10-CM

## 2017-10-04 NOTE — Progress Notes (Deleted)
[name] [address], [place] [address] Phone: [phone] Subjective:    I'm seeing this patient by the request  of:    CC:   [id number]  Brenda Davis is a 52 y.o. female coming in with complaint of ***  Onset-  Location Duration-  Character- Aggravating factors- Reliving factors-  Therapies tried-  Severity-     Past Medical History:  Diagnosis Date  . Anemia   . Chronic kidney disease    STAGE 3 CHRONIC KIDNEY DISEASE SECONDARY TO DIABETIC GLOMERULOSCLEROSIS AND UNCONTROLLED HYPERTENSION - PER OFFICE NOTES DR. Florene [facility].  Marland Kitchen ESRD on dialysis [facility])    "MWF; [address] (05/06/2016)  . Gout   . HCAP (healthcare-associated pneumonia) 05/06/2016  . History of blood transfusion    "related to surgery"  . Hyperlipidemia   . Hypertension   . Morbid obesity ([place])   . Pain    LEFT SHOULDER PAIN - WAS SEEN AT AN URGENT CARE - GIVEN SLING FOR COMFORT AND TOLD ROM AS TOLERATED.  Marland Kitchen Palpitations 09/24/2016  . Type II diabetes mellitus ([place])    "gastric sleeve OR corrected this" (05/06/2016)   Past Surgical History:  Procedure Laterality Date  . AV FISTULA PLACEMENT Left 01/03/2015   Procedure: BRACHIAL CEPHALIC ARTERIOVENOUS  FISTULA CREATION LEFT ARM;  Surgeon: Angelia Mould, MD;  Location: [place];  Service: Vascular;  Laterality: Left;  . CARPAL TUNNEL RELEASE Right   . [place]  . ESOPHAGOGASTRODUODENOSCOPY N/A 09/04/2012   Procedure: ESOPHAGOGASTRODUODENOSCOPY (EGD);  Surgeon: Shann Medal, MD;  Location: [facility];  Service: General;  Laterality: N/A;  PF  . ESOPHAGOGASTRODUODENOSCOPY (EGD) WITH ESOPHAGEAL DILATION N/A 09/29/2012   Procedure: ESOPHAGOGASTRODUODENOSCOPY (EGD) WITH ESOPHAGEAL DILATION;  Surgeon: Milus Banister, MD;  [facility];  Service: Endoscopy;  Laterality: N/A;  . INSERTION OF DIALYSIS CATHETER N/A 01/03/2015   Procedure:  INSERTION OF DIALYSIS CATHETER RIGHT INTERNAL JUGULAR;  Surgeon: Angelia Mould, MD;  Location: [place];  Service: Vascular;  Laterality: N/A;  . LAPAROSCOPIC GASTRIC SLEEVE RESECTION N/A 07/19/2012   Procedure: LAPAROSCOPIC SLEEVE GASTRECTOMY with EGD;  Surgeon: Madilyn Hook, DO;  [facility];  Service: General;  Laterality: N/A;  laparoscopic sleeve gastrectomy with EGD  . PERIPHERAL VASCULAR CATHETERIZATION Left 05/23/2015   Procedure: Nolon Stalls;  Surgeon: Conrad Irwin, MD;  Location: [facility];  Service: Cardiovascular;  Laterality: Left;  upper aRM  . PERIPHERAL VASCULAR CATHETERIZATION Left 05/23/2015   Procedure: Peripheral Vascular Balloon Angioplasty;  Surgeon: Conrad , MD;  Location: [place] [facility];  Service: Cardiovascular;  Laterality: Left;  av fistula  . TUBAL LIGATION  1993  . UPPER GI ENDOSCOPY N/A 07/19/2012   Procedure: UPPER GI ENDOSCOPY;  Surgeon: Madilyn Hook, DO;  [facility];  Service: General;  Laterality: N/A;   Social History   Socioeconomic History  . Marital status: Married    Spouse name: Not on file  . Number of children: Not on file  . Years of education: Not on file  . Highest education level: Not on file  Occupational History  . Not on file  Social Needs  . Financial resource strain: Not on file  . Food insecurity:    Worry: Not on file    Inability: Not on file  . Transportation needs:    Medical: Not on file    Non-medical: Not on file  Tobacco Use  .  Smoking status: Former Smoker    Packs/day: 1.00    Years: 16.00    Pack years: 16.00    Types: Cigarettes    Last attempt to quit: 2009    Years since quitting: 10.7  . Smokeless tobacco: Never Used  Substance and Sexual Activity  . Alcohol use: Never    Frequency: Never  . Drug use: No  . Sexual activity: Yes    Birth control/protection: Post-menopausal  Lifestyle  . Physical activity:    Days per week: Not on file    Minutes per session: Not on file  .  Stress: Not on file  Relationships  . Social connections:    Talks on phone: Not on file    Gets together: Not on file    Attends religious service: Not on file    Active member of club or organization: Not on file    Attends meetings of clubs or organizations: Not on file    Relationship status: Not on file  Other Topics Concern  . Not on file  Social History Narrative  . Not on file   Allergies  Allergen Reactions  . Amlodipine Swelling    edema  . Clonidine Derivatives Swelling    Limbs swell  . Welchol [Colesevelam Hcl] Nausea Only   Family History  Problem Relation Age of Onset  . Hypertension Mother   . Mitral valve prolapse Mother   . Diabetes Father   . Arthritis Other   . Hyperlipidemia Other   . Cancer Neg Hx   . Heart disease Neg Hx   . Kidney disease Neg Hx   . Stroke Neg Hx     Current Outpatient Medications (Endocrine & Metabolic):  Marland Kitchen  Insulin Glargine (TOUJEO MAX SOLOSTAR) 300 UNIT/ML SOPN, Inject 30 Units into the skin daily. .  insulin lispro (HUMALOG) 100 UNIT/ML injection, Inject 0.1 mLs (10 Units total) into the skin 3 (three) times daily before meals.  Current Outpatient Medications (Cardiovascular):  .  carvedilol (COREG) 6.25 MG tablet, Take 6.25 mg by mouth 2 (two) times daily with a meal. .  irbesartan (AVAPRO) 300 MG tablet, TAKE 1 TABLET BY MOUTH EVERYDAY AT BEDTIME (Patient not taking: Reported on 09/03/2017)    Current Outpatient Medications (Hematological):  Marland Kitchen  Cyanocobalamin (B-12 PO), Take 1 tablet by mouth daily. .  folic acid (FOLVITE) 1 MG tablet, Take 1 tablet (1 mg total) by mouth daily.  Current Outpatient Medications (Other):  Marland Kitchen  Amino Acids-Protein Hydrolys (FEEDING SUPPLEMENT, PRO-STAT SUGAR FREE 64,) LIQD, Take 30 mLs by mouth 2 (two) times daily. (Patient not taking: Reported on 09/03/2017) .  AURYXIA 1 GM 210 MG(Fe) tablet, Take 210 mg by mouth 3 (three) times daily with meals.  .  calcium acetate (PHOSLO) 667 MG capsule,  Take 2 capsules (1,334 mg total) by mouth 3 (three) times daily with meals. .  cyclobenzaprine (FLEXERIL) 10 MG tablet, Take 0.5-1 tablets (5-10 mg total) by mouth 2 (two) times daily as needed for muscle spasms. (Patient not taking: Reported on 09/03/2017) .  gentamicin cream (GARAMYCIN) 0.1 %, APPLY TO EXIT SITE DAILY AS DIRECTED .  Insulin Pen Needle (NOVOFINE) 32G X 6 MM MISC, 1 Act by Does not apply route 4 (four) times daily. .  multivitamin (RENA-VIT) TABS tablet, Take 1 tablet by mouth at bedtime. .  ondansetron (ZOFRAN ODT) 4 MG disintegrating tablet, Take 1 tablet (4 mg total) by mouth every 8 (eight) hours as needed for nausea or vomiting. Marland Kitchen  polyethylene glycol (MIRALAX / GLYCOLAX) packet, Take 17 g by mouth daily as needed. (Patient not taking: Reported on 09/03/2017) .  thiamine (VITAMIN B-1) 100 MG tablet, Take 1 tablet (100 mg total) by mouth daily.    Past medical history, social, surgical and family history all reviewed in electronic medical record.  No pertanent information unless stated regarding to the chief complaint.   Review of Systems:  No headache, visual changes, nausea, vomiting, diarrhea, constipation, dizziness, abdominal pain, skin rash, fevers, chills, night sweats, weight loss, swollen lymph nodes, body aches, joint swelling, muscle aches, chest pain, shortness of breath, mood changes.   Objective  There were no vitals taken for this visit. Systems examined below as of    General: No apparent distress alert and oriented x3 mood and affect normal, dressed appropriately.  HEENT: Pupils equal, extraocular movements intact  Respiratory: Patient's speak in full sentences and does not appear short of breath  Cardiovascular: No lower extremity edema, non tender, no erythema  Skin: Warm dry intact with no signs of infection or rash on extremities or on axial skeleton.  Abdomen: Soft nontender  Neuro: Cranial nerves II through XII are intact, neurovascularly intact in  all extremities with 2+ DTRs and 2+ pulses.  Lymph: No lymphadenopathy of posterior or anterior cervical chain or axillae bilaterally.  Gait normal with good balance and coordination.  MSK:  Non tender with full range of motion and good stability and symmetric strength and tone of shoulders, elbows, wrist, hip, knee and ankles bilaterally.     Impression and Recommendations:     This case required medical decision making of moderate complexity. The above documentation has been reviewed and is accurate and complete Lyndal Pulley, DO       Note: This dictation was prepared with Dragon dictation along with smaller phrase technology. Any transcriptional errors that result from this process are unintentional.

## 2017-10-05 ENCOUNTER — Ambulatory Visit: Payer: 59 | Admitting: Family Medicine

## 2017-10-05 ENCOUNTER — Ambulatory Visit: Payer: 59 | Admitting: Sports Medicine

## 2017-10-15 ENCOUNTER — Encounter: Payer: Self-pay | Admitting: Family

## 2017-10-15 ENCOUNTER — Ambulatory Visit (INDEPENDENT_AMBULATORY_CARE_PROVIDER_SITE_OTHER): Payer: 59 | Admitting: Family

## 2017-10-15 VITALS — BP 112/72 | HR 89 | Temp 98.2°F | Ht 65.0 in | Wt 273.0 lb

## 2017-10-15 DIAGNOSIS — I10 Essential (primary) hypertension: Secondary | ICD-10-CM | POA: Diagnosis not present

## 2017-10-15 MED ORDER — CARVEDILOL 6.25 MG PO TABS: 6.2500 mg | ORAL_TABLET | Freq: Two times a day (BID) | ORAL | 1 refills | Status: DC

## 2017-10-15 NOTE — Progress Notes (Signed)
Brenda Davis is a 52 y.o. female with the following history as recorded in EpicCare:  Patient Active Problem List   Diagnosis Date Noted  . Acute pancreatitis 08/25/2017  . Omental infarction ([place]) 08/25/2017  . ESRD on peritoneal dialysis ([place]) 08/25/2017  . DM (diabetes mellitus), type 2 with renal complications ([place]) 71/06/2692  . Pain of upper abdomen 08/24/2017  . Deficiency anemia 08/24/2017  . Diarrhea of infectious origin 08/24/2017  . Elevated lipase 08/24/2017  . Chronic heart failure with preserved ejection fraction ([place]) 06/22/2017  . Allergic rhinitis 06/27/2016  . Severe persistent asthma with exacerbation 05/12/2016  . Anemia due to chronic kidney disease 05/06/2016  . Routine general medical examination at a health care facility 12/05/2015  . Cervical cancer screening 12/05/2015  . Visit for screening mammogram 01/23/2015  . Sinus pause 01/04/2015  . Hyperglycemia 12/31/2014  . Gout due to renal impairment 09/13/2014  . Thiamine deficiency 12/02/2012  . Meralgia paraesthetica 11/28/2012  . S/P laparoscopic sleeve gastrectomy 11/28/2012  . B12 deficiency anemia 11/28/2012  . Intractable vomiting with nausea 09/26/2012  . Hyperlipidemia with target LDL less than 100 04/09/2008  . CKD (chronic kidney disease) stage V requiring chronic dialysis (MWF) 02/08/2008  . HYPERTENSION, BENIGN ESSENTIAL 01/20/1999    Current Outpatient Medications  Medication Sig Dispense Refill  . AURYXIA 1 GM 210 MG(Fe) tablet Take 210 mg by mouth 3 (three) times daily with meals.   3  . calcium acetate (PHOSLO) 667 MG capsule Take 2 capsules (1,334 mg total) by mouth 3 (three) times daily with meals. 180 capsule 0  . carvedilol (COREG) 6.25 MG tablet Take 1 tablet (6.25 mg total) by mouth 2 (two) times daily with a meal. 60 tablet 1  . Cyanocobalamin (B-12 PO) Take 1 tablet by mouth daily.    . folic acid (FOLVITE) 1 MG tablet Take 1 tablet (1 mg total) by mouth daily. 90  tablet 1  . gentamicin cream (GARAMYCIN) 0.1 % APPLY TO EXIT SITE DAILY AS DIRECTED  4  . multivitamin (RENA-VIT) TABS tablet Take 1 tablet by mouth at bedtime. 30 tablet 0  . polyethylene glycol (MIRALAX / GLYCOLAX) packet Take 17 g by mouth daily as needed. 14 each 0  . thiamine (VITAMIN B-1) 100 MG tablet Take 1 tablet (100 mg total) by mouth daily. 90 tablet 1  . cyclobenzaprine (FLEXERIL) 10 MG tablet Take 0.5-1 tablets (5-10 mg total) by mouth 2 (two) times daily as needed for muscle spasms. (Patient not taking: Reported on 09/03/2017) 20 tablet 0  . irbesartan (AVAPRO) 300 MG tablet TAKE 1 TABLET BY MOUTH EVERYDAY AT BEDTIME (Patient not taking: Reported on 10/15/2017) 90 tablet 0   No current facility-administered medications for this visit.     Allergies: Amlodipine; Clonidine derivatives; and Welchol [colesevelam hcl]  Past Medical History:  Diagnosis Date  . Anemia   . Chronic kidney disease    STAGE 3 CHRONIC KIDNEY DISEASE SECONDARY TO DIABETIC GLOMERULOSCLEROSIS AND UNCONTROLLED HYPERTENSION - PER OFFICE NOTES DR. [facility].  Marland Kitchen ESRD on dialysis [facility])    "MWF; [address] (05/06/2016)  . Gout   . HCAP (healthcare-associated pneumonia) 05/06/2016  . History of blood transfusion    "related to surgery"  . Hyperlipidemia   . Hypertension   . Morbid obesity ([place])   . Pain    LEFT SHOULDER PAIN - WAS SEEN AT AN URGENT CARE - GIVEN SLING FOR COMFORT AND TOLD ROM AS TOLERATED.  Marland Kitchen Palpitations 09/24/2016  .  Type II diabetes mellitus ([place])    "gastric sleeve OR corrected this" (05/06/2016)    Past Surgical History:  Procedure Laterality Date  . AV FISTULA PLACEMENT Left 01/03/2015   Procedure: BRACHIAL CEPHALIC ARTERIOVENOUS  FISTULA CREATION LEFT ARM;  Surgeon: Angelia Mould, MD;  Location: [place];  Service: Vascular;  Laterality: Left;  . CARPAL TUNNEL RELEASE Right   . [place]  . ESOPHAGOGASTRODUODENOSCOPY N/A 09/04/2012    Procedure: ESOPHAGOGASTRODUODENOSCOPY (EGD);  Surgeon: Shann Medal, MD;  [facility];  Service: General;  Laterality: N/A;  PF  . ESOPHAGOGASTRODUODENOSCOPY (EGD) WITH ESOPHAGEAL DILATION N/A 09/29/2012   Procedure: ESOPHAGOGASTRODUODENOSCOPY (EGD) WITH ESOPHAGEAL DILATION;  Surgeon: Milus Banister, MD;  [facility];  Service: Endoscopy;  Laterality: N/A;  . INSERTION OF DIALYSIS CATHETER N/A 01/03/2015   Procedure: INSERTION OF DIALYSIS CATHETER RIGHT INTERNAL JUGULAR;  Surgeon: Angelia Mould, MD;  Location: [place];  Service: Vascular;  Laterality: N/A;  . LAPAROSCOPIC GASTRIC SLEEVE RESECTION N/A 07/19/2012   Procedure: LAPAROSCOPIC SLEEVE GASTRECTOMY with EGD;  Surgeon: Madilyn Hook, DO;  [facility];  Service: General;  Laterality: N/A;  laparoscopic sleeve gastrectomy with EGD  . PERIPHERAL VASCULAR CATHETERIZATION Left 05/23/2015   Procedure: Nolon Stalls;  Surgeon: Conrad [employer], MD;  Location: [place] [facility];  Service: Cardiovascular;  Laterality: Left;  upper aRM  . PERIPHERAL VASCULAR CATHETERIZATION Left 05/23/2015   Procedure: Peripheral Vascular Balloon Angioplasty;  Surgeon: Conrad Custar, MD;  Location: [place] [facility];  Service: Cardiovascular;  Laterality: Left;  av fistula  . TUBAL LIGATION  1993  . UPPER GI ENDOSCOPY N/A 07/19/2012   Procedure: UPPER GI ENDOSCOPY;  Surgeon: Madilyn Hook, DO;  [facility];  Service: General;  Laterality: N/A;    Family History  Problem Relation Age of Onset  . Hypertension Mother   . Mitral valve prolapse Mother   . Diabetes Father   . Arthritis Other   . Hyperlipidemia Other   . Cancer Neg Hx   . Heart disease Neg Hx   . Kidney disease Neg Hx   . Stroke Neg Hx     Social History   Tobacco Use  . Smoking status: Former Smoker    Packs/day: 1.00    Years: 16.00    Pack years: 16.00    Types: Cigarettes    Last attempt to quit: 2009    Years since quitting: 10.7  . Smokeless tobacco: Never  Used  Substance Use Topics  . Alcohol use: Never    Frequency: Never    Subjective:  Patient presents with concerns for episodes of low blood pressure- blood pressure medication (Coreg) was increased last month; patient is a dialysis patient; she also notes that her Avapro was stopped in June by her PCP;  In mid-August, when the elevations were noted, she was found to have gallbladder disease and has since had her gallbladder removed. She notes she has been feeling much better- blood sugars have normalized; labs are monitored at the dialysis center weekly; she checks her blood pressure at least 3 x per day and consistently averaging 100/70; feels light-headed when changing positions. Did have labs drawn at dialysis center today;   Vitals:   10/15/17 1435  BP: 112/72  Pulse: 89  Temp: 98.2 F (36.8 C)  TempSrc: Oral  SpO2: 99%  Weight: 273 lb 0.6 oz (123.9 kg)  Height: 5' 5"  (1.651 m)    General: Well developed, well nourished,  in no acute distress  Skin : Warm and dry.  Head: Normocephalic and atraumatic  Lungs: Respirations unlabored; clear to auscultation bilaterally without wheeze, rales, rhonchi  CVS exam: normal rate and regular rhythm.  Neurologic: Alert and oriented; speech intact; face symmetrical; moves all extremities well; CNII-XII intact without focal deficit   Assessment:  1. HYPERTENSION, BENIGN ESSENTIAL     Plan:  Recent episodes of hypotension; since gallbladder has been removed, patient's health has stabilized; agree that blood pressure medication can be lowered back to pre-surgery dosage; change to Coreg 6.25 mg bid; per patient, she has not been on Avapro for at least 3 months; she will check her blood pressure and call back Monday with how she is feeling.   No follow-ups on file.  No orders of the defined types were placed in this encounter.   Requested Prescriptions   Signed Prescriptions Disp Refills  . carvedilol (COREG) 6.25 MG tablet 60 tablet 1     Sig: Take 1 tablet (6.25 mg total) by mouth 2 (two) times daily with a meal.

## 2017-10-15 NOTE — Patient Instructions (Signed)
Please call back with your blood pressure readings on Monday-

## 2017-10-19 ENCOUNTER — Telehealth: Payer: Self-pay | Admitting: Family

## 2017-10-19 NOTE — Telephone Encounter (Signed)
Copied from [place] [id number]. Topic: General - Other >> Oct 19, 2017 11:13 AM Carolyn Stare wrote:     Pt saw Jodi Mourning ans was told to call back and give an update. Pt said she has taken a 1/2 carvedilol (COREG) 6.25 MG tablet for 4 days now and it is still making her bp low    Running 111/80   she said the lowest was 80/43 and this was standing.  She said she does not think she can continue to take the bp med

## 2017-10-19 NOTE — Telephone Encounter (Signed)
Yes, I agree these are too low but she needs the benefit of the Coreg with her history of heart disease;  1) She needs to cut in 1/2 and try taking 3.25 bid; 2) I want her to talk to her nephrologist at dialysis about these numbers. They may need to adjust her dialysis.   Let me hear back from her towards the end of the week.

## 2017-10-19 NOTE — Telephone Encounter (Signed)
Spoke with patient and info given 

## 2017-10-28 ENCOUNTER — Other Ambulatory Visit (INDEPENDENT_AMBULATORY_CARE_PROVIDER_SITE_OTHER): Payer: 59

## 2017-10-28 ENCOUNTER — Ambulatory Visit (INDEPENDENT_AMBULATORY_CARE_PROVIDER_SITE_OTHER): Payer: 59 | Admitting: Nurse Practitioner

## 2017-10-28 ENCOUNTER — Encounter: Payer: Self-pay | Admitting: Nurse Practitioner

## 2017-10-28 ENCOUNTER — Other Ambulatory Visit: Payer: Self-pay | Admitting: Nurse Practitioner

## 2017-10-28 VITALS — BP 120/80 | Temp 98.2°F | Ht 65.0 in | Wt 272.0 lb

## 2017-10-28 DIAGNOSIS — R5383 Other fatigue: Secondary | ICD-10-CM

## 2017-10-28 DIAGNOSIS — R110 Nausea: Secondary | ICD-10-CM

## 2017-10-28 DIAGNOSIS — R197 Diarrhea, unspecified: Secondary | ICD-10-CM | POA: Diagnosis not present

## 2017-10-28 DIAGNOSIS — R3915 Urgency of urination: Secondary | ICD-10-CM

## 2017-10-28 LAB — COMPREHENSIVE METABOLIC PANEL
ALT: 16 U/L (ref 0–35)
AST: 13 U/L (ref 0–37)
Albumin: 3.5 g/dL (ref 3.5–5.2)
Alkaline Phosphatase: 116 U/L (ref 39–117)
BUN: 46 mg/dL — ABNORMAL HIGH (ref 6–23)
CO2: 27 mEq/L (ref 19–32)
Calcium: 8.4 mg/dL (ref 8.4–10.5)
Chloride: 94 mEq/L — ABNORMAL LOW (ref 96–112)
Creatinine, Ser: 11.95 mg/dL (ref 0.40–1.20)
GFR: 4.26 mL/min — CL (ref >60.00)
Glucose, Bld: 389 mg/dL — ABNORMAL HIGH (ref 70–99)
Potassium: 3.5 mEq/L (ref 3.5–5.1)
Sodium: 135 mEq/L (ref 135–145)
Total Bilirubin: 0.4 mg/dL (ref 0.2–1.2)
Total Protein: 7.1 g/dL (ref 6.0–8.3)

## 2017-10-28 LAB — URINALYSIS, ROUTINE W REFLEX MICROSCOPIC
Bilirubin Urine: NEGATIVE
Ketones, ur: NEGATIVE
Nitrite: NEGATIVE
Specific Gravity, Urine: 1.02 (ref 1.000–1.030)
Total Protein, Urine: 100 — AB
Urine Glucose: >=1000 — AB
Urobilinogen, UA: 0.2 (ref 0.0–1.0)
pH: 6 (ref 5.0–8.0)

## 2017-10-28 LAB — LIPASE: Lipase: 286 U/L — ABNORMAL HIGH (ref 11.0–59.0)

## 2017-10-28 LAB — MAGNESIUM: Magnesium: 2.1 mg/dL (ref 1.5–2.5)

## 2017-10-28 LAB — CBC
HCT: 34.4 % — ABNORMAL LOW (ref 36.0–46.0)
Hemoglobin: 11.1 g/dL — ABNORMAL LOW (ref 12.0–15.0)
MCHC: 32.3 g/dL (ref 30.0–36.0)
MCV: 95.5 fl (ref 78.0–100.0)
Platelets: 321 10*3/uL (ref 150.0–400.0)
RBC: 3.6 Mil/uL — ABNORMAL LOW (ref 3.87–5.11)
RDW: 17.7 % — ABNORMAL HIGH (ref 11.5–15.5)
WBC: 8.8 10*3/uL (ref 4.0–10.5)

## 2017-10-28 MED ORDER — SULFAMETHOXAZOLE-TRIMETHOPRIM 400-80 MG PO TABS: 1.0000 | ORAL_TABLET | Freq: Two times a day (BID) | ORAL | 0 refills | Status: DC

## 2017-10-28 NOTE — Patient Instructions (Signed)
Please head downstairs for lab work. If any of your test results are critically abnormal, you will be contacted right away. Otherwise, I will contact you as soon as possible with any changes or recommendations.  Low-Fat Diet for Pancreatitis or Gallbladder Conditions A low-fat diet can be helpful if you have pancreatitis or a gallbladder condition. With these conditions, your pancreas and gallbladder have trouble digesting fats. A healthy eating plan with less fat will help rest your pancreas and gallbladder and reduce your symptoms. What do I need to know about this diet?  Eat a low-fat diet. ? Reduce your fat intake to less than 20-30% of your total daily calories. This is less than 50-60 g of fat per day. ? Remember that you need some fat in your diet. Ask your dietician what your daily goal should be. ? Choose nonfat and low-fat healthy foods. Look for the words "nonfat," "low fat," or "fat free." ? As a guide, look on the label and choose foods with less than 3 g of fat per serving. Eat only one serving.  Avoid alcohol.  Do not smoke. If you need help quitting, talk with your health care provider.  Eat small frequent meals instead of three large heavy meals. What foods can I eat? Grains Include healthy grains and starches such as potatoes, wheat bread, fiber-rich cereal, and brown rice. Choose whole grain options whenever possible. In adults, whole grains should account for 45-65% of your daily calories. Fruits and Vegetables Eat plenty of fruits and vegetables. Fresh fruits and vegetables add fiber to your diet. Meats and Other Protein Sources Eat lean meat such as chicken and pork. Trim any fat off of meat before cooking it. Eggs, fish, and beans are other sources of protein. In adults, these foods should account for 10-35% of your daily calories. Dairy Choose low-fat milk and dairy options. Dairy includes fat and protein, as well as calcium. Fats and Oils Limit high-fat foods  such as fried foods, sweets, baked goods, sugary drinks. Other Creamy sauces and condiments, such as mayonnaise, can add extra fat. Think about whether or not you need to use them, or use smaller amounts or low fat options. What foods are not recommended?  High fat foods, such as: ? [employer]. ? Ice cream. ? [place] toast. ? Sweet rolls. ? Pizza. ? Cheese bread. ? Foods covered with batter, butter, creamy sauces, or cheese. ? Fried foods. ? Sugary drinks and desserts.  Foods that cause gas or bloating This information is not intended to replace advice given to you by your health care provider. Make sure you discuss any questions you have with your health care provider. Document Released: 01/10/2013 Document Revised: 06/13/2015 Document Reviewed: 12/19/2012 Elsevier Interactive Patient Education  2017 [employer].

## 2017-10-28 NOTE — Progress Notes (Signed)
t

## 2017-10-28 NOTE — Progress Notes (Signed)
Brenda Davis is a 52 y.o. female with the following history as recorded in EpicCare:  Patient Active Problem List   Diagnosis Date Noted  . Acute pancreatitis 08/25/2017  . Omental infarction ([place]) 08/25/2017  . ESRD on peritoneal dialysis ([place]) 08/25/2017  . DM (diabetes mellitus), type 2 with renal complications ([place]) 69/[id number]/[id number]  . Pain of upper abdomen 08/24/2017  . Deficiency anemia 08/24/2017  . Diarrhea of infectious origin 08/24/2017  . Elevated lipase 08/24/2017  . Chronic heart failure with preserved ejection fraction ([place]) 06/22/2017  . Allergic rhinitis 06/27/2016  . Severe persistent asthma with exacerbation 05/12/2016  . Anemia due to chronic kidney disease 05/06/2016  . Routine general medical examination at a health care facility 12/05/2015  . Cervical cancer screening 12/05/2015  . Visit for screening mammogram 01/23/2015  . Sinus pause 01/04/2015  . Hyperglycemia 12/31/2014  . Gout due to renal impairment 09/13/2014  . Thiamine deficiency 12/02/2012  . Meralgia paraesthetica 11/28/2012  . S/P laparoscopic sleeve gastrectomy 11/28/2012  . B12 deficiency anemia 11/28/2012  . Intractable vomiting with nausea 09/26/2012  . Hyperlipidemia with target LDL less than 100 04/09/2008  . CKD (chronic kidney disease) stage V requiring chronic dialysis (MWF) 02/08/2008  . HYPERTENSION, BENIGN ESSENTIAL 01/20/1999    Current Outpatient Medications  Medication Sig Dispense Refill  . AURYXIA 1 GM 210 MG(Fe) tablet Take 210 mg by mouth 3 (three) times daily with meals.   3  . calcium acetate (PHOSLO) 667 MG capsule Take 2 capsules (1,334 mg total) by mouth 3 (three) times daily with meals. 180 capsule 0  . carvedilol (COREG) 6.25 MG tablet Take 1 tablet (6.25 mg total) by mouth 2 (two) times daily with a meal. 60 tablet 1  . Cyanocobalamin (B-12 PO) Take 1 tablet by mouth daily.    . cyclobenzaprine (FLEXERIL) 10 MG tablet Take 0.5-1 tablets (5-10 mg total) by mouth  2 (two) times daily as needed for muscle spasms. 20 tablet 0  . folic acid (FOLVITE) 1 MG tablet Take 1 tablet (1 mg total) by mouth daily. 90 tablet 1  . gentamicin cream (GARAMYCIN) 0.1 % APPLY TO EXIT SITE DAILY AS DIRECTED  4  . irbesartan (AVAPRO) 300 MG tablet TAKE 1 TABLET BY MOUTH EVERYDAY AT BEDTIME 90 tablet 0  . multivitamin (RENA-VIT) TABS tablet Take 1 tablet by mouth at bedtime. 30 tablet 0  . polyethylene glycol (MIRALAX / GLYCOLAX) packet Take 17 g by mouth daily as needed. 14 each 0  . thiamine (VITAMIN B-1) 100 MG tablet Take 1 tablet (100 mg total) by mouth daily. 90 tablet 1   No current facility-administered medications for this visit.     Allergies: Amlodipine; Clonidine derivatives; and Welchol [colesevelam hcl]  Past Medical History:  Diagnosis Date  . Anemia   . Chronic kidney disease    STAGE 3 CHRONIC KIDNEY DISEASE SECONDARY TO DIABETIC GLOMERULOSCLEROSIS AND UNCONTROLLED HYPERTENSION - PER OFFICE NOTES DR. Florene [facility].  Marland Kitchen ESRD on dialysis [facility])    "MWF; [address] (05/06/2016)  . Gout   . HCAP (healthcare-associated pneumonia) 05/06/2016  . History of blood transfusion    "related to surgery"  . Hyperlipidemia   . Hypertension   . Morbid obesity ([place])   . Pain    LEFT SHOULDER PAIN - WAS SEEN AT AN URGENT CARE - GIVEN SLING FOR COMFORT AND TOLD ROM AS TOLERATED.  Marland Kitchen Palpitations 09/24/2016  . Type II diabetes mellitus ([facility])    "gastric sleeve  OR corrected this" (05/06/2016)    Past Surgical History:  Procedure Laterality Date  . AV FISTULA PLACEMENT Left 01/03/2015   Procedure: BRACHIAL CEPHALIC ARTERIOVENOUS  FISTULA CREATION LEFT ARM;  Surgeon: Angelia Mould, MD;  Location: [place];  Service: Vascular;  Laterality: Left;  . CARPAL TUNNEL RELEASE Right   . [place]  . ESOPHAGOGASTRODUODENOSCOPY N/A 09/04/2012   Procedure: ESOPHAGOGASTRODUODENOSCOPY (EGD);  Surgeon: Shann Medal, MD;  [facility];  Service: General;  Laterality: N/A;  PF  . ESOPHAGOGASTRODUODENOSCOPY (EGD) WITH ESOPHAGEAL DILATION N/A 09/29/2012   Procedure: ESOPHAGOGASTRODUODENOSCOPY (EGD) WITH ESOPHAGEAL DILATION;  Surgeon: Milus Banister, MD;  [facility];  Service: Endoscopy;  Laterality: N/A;  . INSERTION OF DIALYSIS CATHETER N/A 01/03/2015   Procedure: INSERTION OF DIALYSIS CATHETER RIGHT INTERNAL JUGULAR;  Surgeon: Angelia Mould, MD;  Location: [place];  Service: Vascular;  Laterality: N/A;  . LAPAROSCOPIC GASTRIC SLEEVE RESECTION N/A 07/19/2012   Procedure: LAPAROSCOPIC SLEEVE GASTRECTOMY with EGD;  Surgeon: Madilyn Hook, DO;  [facility];  Service: General;  Laterality: N/A;  laparoscopic sleeve gastrectomy with EGD  . PERIPHERAL VASCULAR CATHETERIZATION Left 05/23/2015   Procedure: Nolon Stalls;  Surgeon: Conrad Latham, MD;  Location: [place] [facility] LAB;  Service: Cardiovascular;  Laterality: Left;  upper aRM  . PERIPHERAL VASCULAR CATHETERIZATION Left 05/23/2015   Procedure: Peripheral Vascular Balloon Angioplasty;  Surgeon: Conrad [place], MD;  Location: [place] CV LAB;  Service: Cardiovascular;  Laterality: Left;  av fistula  . TUBAL LIGATION  1993  . UPPER GI ENDOSCOPY N/A 07/19/2012   Procedure: UPPER GI ENDOSCOPY;  Surgeon: Madilyn Hook, DO;  [facility];  Service: General;  Laterality: N/A;    Family History  Problem Relation Age of Onset  . Hypertension Mother   . Mitral valve prolapse Mother   . Diabetes Father   . Arthritis Other   . Hyperlipidemia Other   . Cancer Neg Hx   . Heart disease Neg Hx   . Kidney disease Neg Hx   . Stroke Neg Hx     Social History   Tobacco Use  . Smoking status: Former Smoker    Packs/day: 1.00    Years: 16.00    Pack years: 16.00    Types: Cigarettes    Last attempt to quit: 2009    Years since quitting: 10.7  . Smokeless tobacco: Never Used  Substance Use Topics  . Alcohol use: Never    Frequency: Never     Subjective:   Brenda Davis is here today requesting evaluation of a complaints of nausea and fatigue, accompanied by her husband. Her symptoms first began after laparoscopic cholecystectomy this past august at [facility] center. She was actually hospitalized for 8 days after the surgery due to complications, since then has  had frequent nausea, feeling fatigued, decreased appetite, and "everytime I eat it runs right through me." Symptoms are seeming to get worse, especially over the past 2 weeks, and she has actually started to notice some urinary urgency over past few days. She is a peritoneal dialysis patient, says however she's not had recent lab work done.   Review of Systems  Constitutional: Positive for malaise/fatigue. Negative for chills and fever.  Cardiovascular: Negative for chest pain and palpitations.  Gastrointestinal: Positive for diarrhea and nausea. Negative for abdominal pain, blood in stool and vomiting.  Genitourinary: Positive for urgency. Negative for flank pain and hematuria.  Neurological: Positive for dizziness. Negative  for loss of consciousness.    Objective:  Vitals:   10/28/17 0800  BP: 120/80  Temp: 98.2 F (36.8 C)  TempSrc: Oral  Weight: 272 lb (123.4 kg)  Height: 5\' 5"  (1.651 m)    General: Well developed, well nourished, in no acute distress  Skin : Warm and dry.  Head: Normocephalic and atraumatic  Eyes: Sclera and conjunctiva clear; pupils round and reactive to light; extraocular movements intact  Oropharynx: Pink, supple. Neck: Supple  Lungs: Respirations unlabored; clear to auscultation bilaterally without wheeze, rales, rhonchi  CVS exam: normal rate and regular rhythm, S1 and S2 normal.  Abdomen: Soft; rotund, nontender; normoactive bowel sounds; no masses or hepatosplenomegaly   Extremities: No edema, cyanosis Vessels: Symmetric bilaterally  Neurologic: Alert and oriented; speech intact; face symmetrical; moves all extremities well; CNII-XII  intact without focal deficit    Assessment:  1. Nausea   2. Diarrhea, unspecified type   3. Other fatigue   4. Urinary urgency     Plan:  Unsure if recent labs have been done, will check labs today Suspect complaints are combination of recently having gallbladder surgery as well as being a dialysis patient, will refer to GI for further evaluation Continue follow up with nephrology as planned Home management including diet, red flags, and return precautions  discussed and printed on AVS   No follow-ups on file.  Orders Placed This Encounter  Procedures  . CULTURE, URINE COMPREHENSIVE    Standing Status:   Future    Standing Expiration Date:   11/28/2017  . CBC    Standing Status:   Future    Standing Expiration Date:   10/29/2018  . Comprehensive metabolic panel    Standing Status:   Future    Standing Expiration Date:   10/29/2018  . Lipase    Standing Status:   Future    Standing Expiration Date:   10/28/2018  . Urinalysis, Routine w reflex microscopic    Standing Status:   Future    Standing Expiration Date:   10/28/2018  . Magnesium    Standing Status:   Future    Standing Expiration Date:   10/28/2018  . Ambulatory referral to Gastroenterology    Referral Priority:   Routine    Referral Type:   Consultation    Referral Reason:   Specialty Services Required    Number of Visits Requested:   1    Requested Prescriptions    No prescriptions requested or ordered in this encounter    1. Nausea - CBC; Future - Comprehensive metabolic panel; Future - Lipase; Future - Urinalysis, Routine w reflex microscopic; Future - CULTURE, URINE COMPREHENSIVE; Future - Ambulatory referral to Gastroenterology - Magnesium; Future  2. Diarrhea, unspecified type - CBC; Future - Comprehensive metabolic panel; Future - Lipase; Future - Urinalysis, Routine w reflex microscopic; Future - CULTURE, URINE COMPREHENSIVE; Future - Ambulatory referral to Gastroenterology - Magnesium;  Future  3. Other fatigue - CBC; Future - Comprehensive metabolic panel; Future - Lipase; Future - Urinalysis, Routine w reflex microscopic; Future - CULTURE, URINE COMPREHENSIVE; Future - Magnesium; Future  4. Urinary urgency - Urinalysis, Routine w reflex microscopic; Future - CULTURE, URINE COMPREHENSIVE; Future

## 2017-10-30 LAB — CULTURE, URINE COMPREHENSIVE
MICRO NUMBER:: 91220065
SPECIMEN QUALITY:: ADEQUATE

## 2017-11-01 ENCOUNTER — Other Ambulatory Visit: Payer: Self-pay | Admitting: Nurse Practitioner

## 2017-11-01 MED ORDER — AMOXICILLIN-POT CLAVULANATE 875-125 MG PO TABS: 1.0000 | ORAL_TABLET | Freq: Two times a day (BID) | ORAL | 0 refills | Status: DC

## 2017-11-04 ENCOUNTER — Encounter: Payer: Self-pay | Admitting: Gastroenterology

## 2017-11-04 ENCOUNTER — Other Ambulatory Visit: Payer: 59

## 2017-11-04 ENCOUNTER — Ambulatory Visit (INDEPENDENT_AMBULATORY_CARE_PROVIDER_SITE_OTHER): Payer: 59 | Admitting: Gastroenterology

## 2017-11-04 VITALS — BP 110/80 | HR 78 | Ht 65.0 in | Wt 268.0 lb

## 2017-11-04 DIAGNOSIS — R197 Diarrhea, unspecified: Secondary | ICD-10-CM

## 2017-11-04 DIAGNOSIS — R748 Abnormal levels of other serum enzymes: Secondary | ICD-10-CM

## 2017-11-04 DIAGNOSIS — R1013 Epigastric pain: Secondary | ICD-10-CM

## 2017-11-04 DIAGNOSIS — R112 Nausea with vomiting, unspecified: Secondary | ICD-10-CM

## 2017-11-04 MED ORDER — ONDANSETRON 4 MG PO TBDP: 4.0000 mg | ORAL_TABLET | Freq: Four times a day (QID) | ORAL | 1 refills | Status: DC | PRN

## 2017-11-04 NOTE — Progress Notes (Signed)
Initial assessment and plan of the physician assistant noted

## 2017-11-04 NOTE — Progress Notes (Signed)
11/04/2017 Brenda Davis [id number] 29-Nov-1965   HISTORY OF PRESENT ILLNESS: This is a 52 year old female who is previously patient of Dr. Kelby Fam.  Her care will be assumed by Dr. Henrene Pastor.  Anyway, she presents her office today with several complaints.  She says that the second week in July she started having issues with diarrhea, nausea, vomiting, and abdominal pain particularly in her epigastrium/mid-abdomen.  In early August she had a CT scan of the abdomen pelvis with contrast that showed a fat-containing area of inflammatory stranding in the right upper quadrant most consistent with omental infarction and no pancreatic abnormalities noted.  She then had two abdominal ultrasounds that showed sludge in the gallbladder.  During all this her lipase had been elevated with highest reading of 884.  She did ultimately have her gallbladder out through [facility] on August 22.  Despite that she has continued to have the same symptoms.  Lipase remains elevated at 213, but LFTs are normal.  She reports constant nausea with occasional vomiting.  She has had a lot of diarrhea almost every single day and was up with diarrhea all night last night.  Denies seeing blood in her stools.  No fever.  Never had colonoscopy.    Has history of gastric sleeve, is on PD for ESRD, HTN, HLD.    Referred here by her PCP, Caesar Chestnut, NP.   Past Medical History:  Diagnosis Date  . Anemia   . Chronic kidney disease    STAGE 3 CHRONIC KIDNEY DISEASE SECONDARY TO DIABETIC GLOMERULOSCLEROSIS AND UNCONTROLLED HYPERTENSION - PER OFFICE NOTES DR. Florene Glen [facility].  Marland Kitchen ESRD on dialysis [facility])    "MWF; [address] (05/06/2016)  . Gout   . HCAP (healthcare-associated pneumonia) 05/06/2016  . History of blood transfusion    "related to surgery"  . Hyperlipidemia   . Hypertension   . Morbid obesity ([place])   . Pain    LEFT SHOULDER PAIN - WAS SEEN AT AN URGENT CARE - GIVEN SLING FOR  COMFORT AND TOLD ROM AS TOLERATED.  Marland Kitchen Palpitations 09/24/2016  . Type II diabetes mellitus ([place])    "gastric sleeve OR corrected this" (05/06/2016)   Past Surgical History:  Procedure Laterality Date  . AV FISTULA PLACEMENT Left 01/03/2015   Procedure: BRACHIAL CEPHALIC ARTERIOVENOUS  FISTULA CREATION LEFT ARM;  Surgeon: Angelia Mould, MD;  Location: [facility];  Service: Vascular;  Laterality: Left;  . CARPAL TUNNEL RELEASE Right   . [place]  . CHOLECYSTECTOMY  09/14/2017  . ESOPHAGOGASTRODUODENOSCOPY N/A 09/04/2012   Procedure: ESOPHAGOGASTRODUODENOSCOPY (EGD);  Surgeon: Shann Medal, MD;  [facility];  Service: General;  Laterality: N/A;  PF  . ESOPHAGOGASTRODUODENOSCOPY (EGD) WITH ESOPHAGEAL DILATION N/A 09/29/2012   Procedure: ESOPHAGOGASTRODUODENOSCOPY (EGD) WITH ESOPHAGEAL DILATION;  Surgeon: Milus Banister, MD;  [facility];  Service: Endoscopy;  Laterality: N/A;  . INSERTION OF DIALYSIS CATHETER N/A 01/03/2015   Procedure: INSERTION OF DIALYSIS CATHETER RIGHT INTERNAL JUGULAR;  Surgeon: Angelia Mould, MD;  Location: [place];  Service: Vascular;  Laterality: N/A;  . LAPAROSCOPIC GASTRIC SLEEVE RESECTION N/A 07/19/2012   Procedure: LAPAROSCOPIC SLEEVE GASTRECTOMY with EGD;  Surgeon: Madilyn Hook, DO;  [facility];  Service: General;  Laterality: N/A;  laparoscopic sleeve gastrectomy with EGD  . PERIPHERAL VASCULAR CATHETERIZATION Left 05/23/2015   Procedure: Nolon Stalls;  Surgeon: Conrad Pavo, MD;  Location: [place] CV LAB;  Service: Cardiovascular;  Laterality: Left;  upper aRM  . PERIPHERAL VASCULAR CATHETERIZATION Left 05/23/2015   Procedure: Peripheral Vascular Balloon Angioplasty;  Surgeon: Conrad Andrew, MD;  Location: [place] CV LAB;  Service: Cardiovascular;  Laterality: Left;  av fistula  . TUBAL LIGATION  1993  . UPPER GI ENDOSCOPY N/A 07/19/2012   Procedure: UPPER GI ENDOSCOPY;  Surgeon: Madilyn Hook, DO;  [facility];  Service: General;  Laterality: N/A;    reports that she quit smoking about 10 years ago. Her smoking use included cigarettes. She has a 16.00 pack-year smoking history. She has never used smokeless tobacco. She reports that she does not drink alcohol or use drugs. family history includes Arthritis in her other; Diabetes in her father; Hyperlipidemia in her other; Hypertension in her mother; Mitral valve prolapse in her mother. Allergies  Allergen Reactions  . Amlodipine Swelling    edema  . Clonidine Derivatives Swelling    Limbs swell  . Welchol [Colesevelam Hcl] Nausea Only      Outpatient Encounter Medications as of 11/04/2017  Medication Sig  . amoxicillin-clavulanate (AUGMENTIN) 875-125 MG tablet Take 1 tablet by mouth 2 (two) times daily.  Lorin Picket 1 GM 210 MG(Fe) tablet Take 210 mg by mouth 3 (three) times daily with meals.   . calcium acetate (PHOSLO) 667 MG capsule Take 2 capsules (1,334 mg total) by mouth 3 (three) times daily with meals.  . carvedilol (COREG) 6.25 MG tablet Take 1 tablet (6.25 mg total) by mouth 2 (two) times daily with a meal.  . Cyanocobalamin (B-12 PO) Take 1 tablet by mouth daily.  . cyclobenzaprine (FLEXERIL) 10 MG tablet Take 0.5-1 tablets (5-10 mg total) by mouth 2 (two) times daily as needed for muscle spasms.  . folic acid (FOLVITE) 1 MG tablet Take 1 tablet (1 mg total) by mouth daily.  Marland Kitchen gentamicin cream (GARAMYCIN) 0.1 % APPLY TO EXIT SITE DAILY AS DIRECTED  . multivitamin (RENA-VIT) TABS tablet Take 1 tablet by mouth at bedtime.  . polyethylene glycol (MIRALAX / GLYCOLAX) packet Take 17 g by mouth daily as needed.  . thiamine (VITAMIN B-1) 100 MG tablet Take 1 tablet (100 mg total) by mouth daily.  . [DISCONTINUED] irbesartan (AVAPRO) 300 MG tablet TAKE 1 TABLET BY MOUTH EVERYDAY AT BEDTIME   No facility-administered encounter medications on file as of 11/04/2017.      REVIEW OF SYSTEMS  : All other systems reviewed and negative  except where noted in the History of Present Illness.   PHYSICAL EXAM: BP 110/80   Pulse 78   Ht 5' 5"  (1.651 m)   Wt 268 lb (121.6 kg)   BMI 44.60 kg/m  General: Well developed black female in no acute distress Head: Normocephalic and atraumatic Eyes:  Sclerae anicteric, conjunctiva pink. Ears: Normal auditory acuity Lungs: Clear throughout to auscultation; no increased WOB. Heart: Regular rate and rhythm; no M/R/G. Abdomen: Soft, non-distended.  BS present.  Diffuse TTP but mostly in epigastrium. Musculoskeletal: Symmetrical with no gross deformities  Skin: No lesions on visible extremities Extremities: No edema  Neurological: Alert oriented x 4, grossly non-focal Psychological:  Alert and cooperative. Normal mood and affect  ASSESSMENT AND PLAN: *52 year old female with complaints of epigastric/mid abdominal pain, nausea, vomiting, diarrhea since July.  Lipase was elevated and she eventually had cholecystectomy, but no pancreatic abnormalities identified on a CT scan.  Symptoms have continued since that time.  Will check stool studies including C. difficile PCR, gastrointestinal pathogen panel, pancreatic elastase.  Will give Zofran  to use as needed for nausea and can use over-the-counter Imodium for diarrhea.  She should make sure that she is staying hydrated well drinking lots of fluid and also just a bland diet as tolerated.  We will repeat CT scan of the abdomen and pelvis with contrast.  She may ultimately need colonoscopy to evaluate diarrhea if stool studies are unremarkable.   CC:  Lance Sell, NP

## 2017-11-04 NOTE — Patient Instructions (Signed)
Go to the basement today for labs  We will send Zofran to your pharmacy  Use Imodium OTC as needed  Drink a lot of fluid and start bland diet      You have been scheduled for a CT scan of the abdomen and pelvis at [facility] ([address]---this is in the same building as Press [facility]).   You are scheduled on 11/11/2017 at [facility] should arrive 15 minutes prior to your appointment time for registration. Please follow the written instructions below on the day of your exam:  WARNING: IF YOU ARE ALLERGIC TO IODINE/X-RAY DYE, PLEASE NOTIFY RADIOLOGY IMMEDIATELY AT [phone]! YOU WILL BE GIVEN A 13 HOUR PREMEDICATION PREP.  1) Do not eat or drink anything after 7am (4 hours prior to your test) 2) You have been given 2 bottles of oral contrast to drink. The solution may taste better if refrigerated, but do NOT add ice or any other liquid to this solution. Shake well before drinking.    Drink 1 bottle of contrast @ 9am (2 hours prior to your exam)  Drink 1 bottle of contrast @ 10am (1 hour prior to your exam)  You may take any medications as prescribed with a small amount of water, if necessary. If you take any of the following medications: METFORMIN, GLUCOPHAGE, GLUCOVANCE, AVANDAMET, RIOMET, FORTAMET, [place] MET, JANUMET, GLUMETZA or METAGLIP, you MAY be asked to HOLD this medication 48 hours AFTER the exam.  The purpose of you drinking the oral contrast is to aid in the visualization of your intestinal tract. The contrast solution may cause some diarrhea. Depending on your individual set of symptoms, you may also receive an intravenous injection of x-ray contrast/dye. Plan on being at [facility] for 30 minutes or longer, depending on the type of exam you are having performed.  This test typically takes 30-45 minutes to complete.  If you have any questions regarding your exam or if you need to reschedule, you may call the CT department at [phone]  between the hours of 8:00 am and 5:00 pm, Monday-Friday.  ________________________________________________________________________   Criss Rosales Diet A bland diet consists of foods that do not have a lot of fat or fiber. Foods without fat or fiber are easier for the body to digest. They are also less likely to irritate your mouth, throat, stomach, and other parts of your gastrointestinal tract. A bland diet is sometimes called a BRAT diet. What is my plan? Your health care provider or dietitian may recommend specific changes to your diet to prevent and treat your symptoms, such as:  Eating small meals often.  Cooking food until it is soft enough to chew easily.  Chewing your food well.  Drinking fluids slowly.  Not eating foods that are very spicy, sour, or fatty.  Not eating citrus fruits, such as oranges and grapefruit.  What do I need to know about this diet?  Eat a variety of foods from the bland diet food list.  Do not follow a bland diet longer than you have to.  Ask your health care provider whether you should take vitamins. What foods can I eat? Grains  Hot cereals, such as cream of wheat. Bread, crackers, or tortillas made from refined white flour. Rice. Vegetables Canned or cooked vegetables. Mashed or boiled potatoes. Fruits Bananas. Applesauce. Other types of cooked or canned fruit with the skin and seeds removed, such as canned peaches or pears. Meats and Other Protein Sources Scrambled eggs. Creamy peanut  butter or other nut butters. Lean, well-cooked meats, such as chicken or fish. Tofu. Soups or broths. Dairy Low-fat dairy products, such as milk, cottage cheese, or yogurt. Beverages Water. Herbal tea. Apple juice. Sweets and Desserts Pudding. Custard. Fruit gelatin. Ice cream. Fats and Oils Mild salad dressings. Canola or olive oil. The items listed above may not be a complete list of allowed foods or beverages. Contact your dietitian for more options. What  foods are not recommended? Foods and ingredients that are often not recommended include:  Spicy foods, such as hot sauce or salsa.  Fried foods.  Sour foods, such as pickled or fermented foods.  Raw vegetables or fruits, especially citrus or berries.  Caffeinated drinks.  Alcohol.  Strongly flavored seasonings or condiments.  The items listed above may not be a complete list of foods and beverages that are not allowed. Contact your dietitian for more information. This information is not intended to replace advice given to you by your health care provider. Make sure you discuss any questions you have with your health care provider. Document Released: 04/29/2015 Document Revised: 06/13/2015 Document Reviewed: 01/17/2014 Elsevier Interactive Patient Education  2018 [employer].

## 2017-11-08 ENCOUNTER — Telehealth: Payer: Self-pay | Admitting: Nurse Practitioner

## 2017-11-08 DIAGNOSIS — N390 Urinary tract infection, site not specified: Secondary | ICD-10-CM

## 2017-11-08 NOTE — Telephone Encounter (Signed)
Copied from [place] [id number]. Topic: General - Other >> Nov 08, 2017 12:16 PM Yvette Rack wrote: Reason for CRM: pt calling stating  that the Amoxicillin that was given to her is making her have watery diarrhea on Thursday the GI doctor told her to stop taking the Amoxicillin because it will cause the diarrhea  pt would like something else called into her pharmacy for her UTI    [id number] [place] [address] [phone](Phone) [phone](Fax)

## 2017-11-08 NOTE — Telephone Encounter (Signed)
Lets have her stop by lab to repeat her urine culture first, to make sure she still needs more antibiotics. Urine culture has been ordered

## 2017-11-09 ENCOUNTER — Other Ambulatory Visit: Payer: 59

## 2017-11-09 DIAGNOSIS — N390 Urinary tract infection, site not specified: Secondary | ICD-10-CM

## 2017-11-09 NOTE — Telephone Encounter (Signed)
Pt informed of below.  

## 2017-11-10 ENCOUNTER — Other Ambulatory Visit: Payer: 59

## 2017-11-10 DIAGNOSIS — R112 Nausea with vomiting, unspecified: Secondary | ICD-10-CM

## 2017-11-10 DIAGNOSIS — R1013 Epigastric pain: Secondary | ICD-10-CM

## 2017-11-10 DIAGNOSIS — R197 Diarrhea, unspecified: Secondary | ICD-10-CM

## 2017-11-10 DIAGNOSIS — R748 Abnormal levels of other serum enzymes: Secondary | ICD-10-CM

## 2017-11-11 ENCOUNTER — Other Ambulatory Visit: Payer: Self-pay | Admitting: Internal Medicine

## 2017-11-11 ENCOUNTER — Ambulatory Visit (INDEPENDENT_AMBULATORY_CARE_PROVIDER_SITE_OTHER)
Admission: RE | Admit: 2017-11-11 | Discharge: 2017-11-11 | Disposition: A | Discharge status: Home / Self Care | Payer: 59 | Source: Ambulatory Visit | Attending: Gastroenterology | Admitting: Gastroenterology

## 2017-11-11 DIAGNOSIS — R1013 Epigastric pain: Secondary | ICD-10-CM

## 2017-11-11 DIAGNOSIS — N390 Urinary tract infection, site not specified: Principal | ICD-10-CM

## 2017-11-11 DIAGNOSIS — R748 Abnormal levels of other serum enzymes: Secondary | ICD-10-CM

## 2017-11-11 DIAGNOSIS — R112 Nausea with vomiting, unspecified: Secondary | ICD-10-CM

## 2017-11-11 DIAGNOSIS — R197 Diarrhea, unspecified: Secondary | ICD-10-CM

## 2017-11-11 DIAGNOSIS — B9620 Unspecified Escherichia coli [E. coli] as the cause of diseases classified elsewhere: Secondary | ICD-10-CM | POA: Insufficient documentation

## 2017-11-11 LAB — CULTURE, URINE COMPREHENSIVE
MICRO NUMBER:: 91268673
SPECIMEN QUALITY:: ADEQUATE

## 2017-11-11 MED ORDER — IOPAMIDOL (ISOVUE-300) INJECTION 61%
100.0000 mL | Freq: Once | INTRAVENOUS | Status: AC | PRN
  Administered 2017-11-11: 100 mL via INTRAVENOUS

## 2017-11-11 MED ORDER — LEVOFLOXACIN 250 MG PO TABS: 250.0000 mg | ORAL_TABLET | Freq: Every day | ORAL | 0 refills | Status: AC

## 2017-11-11 NOTE — Telephone Encounter (Signed)
Pt calling about urine results she need something for UTI please call her at [phone]

## 2017-11-12 NOTE — Telephone Encounter (Signed)
Patient informed PCP has sent in rx to treat her UTI.

## 2017-11-15 ENCOUNTER — Encounter: Payer: Self-pay | Admitting: Internal Medicine

## 2017-11-15 ENCOUNTER — Ambulatory Visit (INDEPENDENT_AMBULATORY_CARE_PROVIDER_SITE_OTHER): Payer: 59 | Admitting: Internal Medicine

## 2017-11-15 VITALS — BP 112/64 | HR 85 | Temp 97.6°F | Resp 16 | Ht 65.0 in | Wt 264.0 lb

## 2017-11-15 DIAGNOSIS — M10372 Gout due to renal impairment, left ankle and foot: Secondary | ICD-10-CM

## 2017-11-15 LAB — GASTROINTESTINAL PATHOGEN PANEL PCR
C. difficile Tox A/B, PCR: NOT DETECTED
Campylobacter, PCR: NOT DETECTED
Cryptosporidium, PCR: NOT DETECTED
E coli (ETEC) LT/ST PCR: NOT DETECTED
E coli (STEC) stx1/stx2, PCR: NOT DETECTED
E coli 0157, PCR: NOT DETECTED
Giardia lamblia, PCR: NOT DETECTED
Norovirus, PCR: NOT DETECTED
Rotavirus A, PCR: NOT DETECTED
Salmonella, PCR: NOT DETECTED
Shigella, PCR: NOT DETECTED

## 2017-11-15 MED ORDER — PREDNISONE 10 MG PO TABS: ORAL_TABLET | ORAL | 0 refills | Status: DC

## 2017-11-15 NOTE — Progress Notes (Signed)
Subjective:    Patient ID: Brenda Davis, female    DOB: 1965-04-01, 52 y.o.   MRN: [id number]  HPI The patient is here for an acute visit.  Left ankle pain, ? Gout: 2 days ago she was at work and she went to get up and go to the bathroom she had left ankle pain.  Later that day she took an allopurinol, which she has not taken in the long time.  Sunday morning when she woke up she had severe pain in the left ankle.  It is not swollen and not red.  She denies injury. She denies fever or chills.  She has had gout many times.   She is on peritoneal dialysis.  She has not taken anything for the pain except for the one allopurinol.  Medications and allergies reviewed with patient and updated if appropriate.  Patient Active Problem List   Diagnosis Date Noted  . E. coli UTI (urinary tract infection) 11/11/2017  . Diarrhea 11/04/2017  . Acute pancreatitis 08/25/2017  . Omental infarction ([place]) 08/25/2017  . ESRD on peritoneal dialysis ([place]) 08/25/2017  . DM (diabetes mellitus), type 2 with renal complications ([place]) 88/41/6606  . Abdominal pain, epigastric 08/24/2017  . Deficiency anemia 08/24/2017  . Diarrhea of infectious origin 08/24/2017  . Elevated lipase 08/24/2017  . Chronic heart failure with preserved ejection fraction ([place]) 06/22/2017  . Allergic rhinitis 06/27/2016  . Severe persistent asthma with exacerbation 05/12/2016  . Anemia due to chronic kidney disease 05/06/2016  . Routine general medical examination at a health care facility 12/05/2015  . Cervical cancer screening 12/05/2015  . Visit for screening mammogram 01/23/2015  . Sinus pause 01/04/2015  . Hyperglycemia 12/31/2014  . Gout due to renal impairment 09/13/2014  . Thiamine deficiency 12/02/2012  . Meralgia paraesthetica 11/28/2012  . S/P laparoscopic sleeve gastrectomy 11/28/2012  . B12 deficiency anemia 11/28/2012  . Nausea and vomiting 09/26/2012  . Hyperlipidemia with target LDL less than 100  04/09/2008  . CKD (chronic kidney disease) stage V requiring chronic dialysis (MWF) 02/08/2008  . HYPERTENSION, BENIGN ESSENTIAL 01/20/1999    Current Outpatient Medications on File Prior to Visit  Medication Sig Dispense Refill  . AURYXIA 1 GM 210 MG(Fe) tablet Take 210 mg by mouth 3 (three) times daily with meals.   3  . calcium acetate (PHOSLO) 667 MG capsule Take 2 capsules (1,334 mg total) by mouth 3 (three) times daily with meals. 180 capsule 0  . Cyanocobalamin (B-12 PO) Take 1 tablet by mouth daily.    . cyclobenzaprine (FLEXERIL) 10 MG tablet Take 0.5-1 tablets (5-10 mg total) by mouth 2 (two) times daily as needed for muscle spasms. 20 tablet 0  . folic acid (FOLVITE) 1 MG tablet Take 1 tablet (1 mg total) by mouth daily. 90 tablet 1  . gentamicin cream (GARAMYCIN) 0.1 % APPLY TO EXIT SITE DAILY AS DIRECTED  4  . levofloxacin (LEVAQUIN) 250 MG tablet Take 1 tablet (250 mg total) by mouth daily for 5 days. 5 tablet 0  . multivitamin (RENA-VIT) TABS tablet Take 1 tablet by mouth at bedtime. 30 tablet 0  . ondansetron (ZOFRAN ODT) 4 MG disintegrating tablet Take 1 tablet (4 mg total) by mouth every 6 (six) hours as needed for nausea or vomiting. 30 tablet 1  . polyethylene glycol (MIRALAX / GLYCOLAX) packet Take 17 g by mouth daily as needed. 14 each 0  . thiamine (VITAMIN B-1) 100 MG tablet Take 1 tablet (100 mg  total) by mouth daily. 90 tablet 1  . carvedilol (COREG) 6.25 MG tablet Take 1 tablet (6.25 mg total) by mouth 2 (two) times daily with a meal. (Patient not taking: Reported on 11/15/2017) 60 tablet 1   No current facility-administered medications on file prior to visit.     Past Medical History:  Diagnosis Date  . Anemia   . Chronic kidney disease    STAGE 3 CHRONIC KIDNEY DISEASE SECONDARY TO DIABETIC GLOMERULOSCLEROSIS AND UNCONTROLLED HYPERTENSION - PER OFFICE NOTES DR. Florene Glen [facility].  Marland Kitchen ESRD on dialysis [facility])    "MWF; [address] (05/06/2016)  .  Gout   . HCAP (healthcare-associated pneumonia) 05/06/2016  . History of blood transfusion    "related to surgery"  . Hyperlipidemia   . Hypertension   . Morbid obesity ([place])   . Pain    LEFT SHOULDER PAIN - WAS SEEN AT AN URGENT CARE - GIVEN SLING FOR COMFORT AND TOLD ROM AS TOLERATED.  Marland Kitchen Palpitations 09/24/2016  . Type II diabetes mellitus ([place])    "gastric sleeve OR corrected this" (05/06/2016)    Past Surgical History:  Procedure Laterality Date  . AV FISTULA PLACEMENT Left 01/03/2015   Procedure: BRACHIAL CEPHALIC ARTERIOVENOUS  FISTULA CREATION LEFT ARM;  Surgeon: Angelia Mould, MD;  Location: [facility];  Service: Vascular;  Laterality: Left;  . CARPAL TUNNEL RELEASE Right   . [place]  . CHOLECYSTECTOMY  09/14/2017  . ESOPHAGOGASTRODUODENOSCOPY N/A 09/04/2012   Procedure: ESOPHAGOGASTRODUODENOSCOPY (EGD);  Surgeon: Shann Medal, MD;  Location: [facility];  Service: General;  Laterality: N/A;  PF  . ESOPHAGOGASTRODUODENOSCOPY (EGD) WITH ESOPHAGEAL DILATION N/A 09/29/2012   Procedure: ESOPHAGOGASTRODUODENOSCOPY (EGD) WITH ESOPHAGEAL DILATION;  Surgeon: Milus Banister, MD;  Location: [facility];  Service: Endoscopy;  Laterality: N/A;  . INSERTION OF DIALYSIS CATHETER N/A 01/03/2015   Procedure: INSERTION OF DIALYSIS CATHETER RIGHT INTERNAL JUGULAR;  Surgeon: Angelia Mould, MD;  Location: [facility];  Service: Vascular;  Laterality: N/A;  . LAPAROSCOPIC GASTRIC SLEEVE RESECTION N/A 07/19/2012   Procedure: LAPAROSCOPIC SLEEVE GASTRECTOMY with EGD;  Surgeon: Madilyn Hook, DO;  Location: [facility];  Service: General;  Laterality: N/A;  laparoscopic sleeve gastrectomy with EGD  . PERIPHERAL VASCULAR CATHETERIZATION Left 05/23/2015   Procedure: Nolon Stalls;  Surgeon: Conrad [place], MD;  Location: [facility];  Service: Cardiovascular;  Laterality: Left;  upper aRM  . PERIPHERAL VASCULAR CATHETERIZATION Left 05/23/2015   Procedure: Peripheral Vascular Balloon  Angioplasty;  Surgeon: Conrad Cayce, MD;  Location: [facility];  Service: Cardiovascular;  Laterality: Left;  av fistula  . TUBAL LIGATION  1993  . UPPER GI ENDOSCOPY N/A 07/19/2012   Procedure: UPPER GI ENDOSCOPY;  Surgeon: Madilyn Hook, DO;  Location: [facility];  Service: General;  Laterality: N/A;    Social History   Socioeconomic History  . Marital status: Married    Spouse name: Not on file  . Number of children: 3  . Years of education: Not on file  . Highest education level: Not on file  Occupational History  . Occupation: Research scientist (physical sciences): [employer]  Social Needs  . Financial resource strain: Not on file  . Food insecurity:    Worry: Not on file    Inability: Not on file  . Transportation needs:    Medical: Not on file    Non-medical: Not on file  Tobacco Use  . Smoking status: Former Smoker  Packs/day: 1.00    Years: 16.00    Pack years: 16.00    Types: Cigarettes    Last attempt to quit: 2009    Years since quitting: 10.8  . Smokeless tobacco: Never Used  Substance and Sexual Activity  . Alcohol use: Never    Frequency: Never  . Drug use: No  . Sexual activity: Yes    Birth control/protection: Post-menopausal  Lifestyle  . Physical activity:    Days per week: Not on file    Minutes per session: Not on file  . Stress: Not on file  Relationships  . Social connections:    Talks on phone: Not on file    Gets together: Not on file    Attends religious service: Not on file    Active member of club or organization: Not on file    Attends meetings of clubs or organizations: Not on file    Relationship status: Not on file  Other Topics Concern  . Not on file  Social History Narrative  . Not on file    Family History  Problem Relation Age of Onset  . Hypertension Mother   . Mitral valve prolapse Mother   . Diabetes Father   . Arthritis Other   . Hyperlipidemia Other   . Cancer Neg Hx   . Heart disease Neg Hx   . Kidney disease Neg Hx     . Stroke Neg Hx     Review of Systems  Constitutional: Negative for chills and fever.  Musculoskeletal: Negative for joint swelling.  Skin: Negative for color change and wound.  Neurological: Negative for numbness.       Objective:   Vitals:   11/15/17 1033  BP: 112/64  Pulse: 85  Resp: 16  Temp: 97.6 F (36.4 C)  SpO2: 99%   BP Readings from Last 3 Encounters:  11/15/17 112/64  11/04/17 110/80  10/28/17 120/80   Wt Readings from Last 3 Encounters:  11/15/17 264 lb (119.7 kg)  11/04/17 268 lb (121.6 kg)  10/28/17 272 lb (123.4 kg)   Body mass index is 43.93 kg/m.   Physical Exam  Constitutional: She appears well-developed and well-nourished. No distress.  HENT:  Head: Normocephalic and atraumatic.  Musculoskeletal: She exhibits tenderness (Tenderness left ankle with palpation and with passive and active flexion -extension). She exhibits no deformity.  Decreased range of motion of left ankle due to pain.  Mild swelling left ankle   Neurological: No sensory deficit.  Skin: She is not diaphoretic.  Increased warmth left ankle, no obvious erythema or skin changes, no break in skin left lower extremity or foot           Assessment & Plan:    See Problem List for Assessment and Plan of chronic medical problems.

## 2017-11-15 NOTE — Patient Instructions (Addendum)
Take the prednisone taper as prescribed.   Let your kidney doctor know you are on prednisone for gout.   Discuss with them restarting the allopurinol.    Call if no improvement or if your symptoms worsen      Gout Gout is painful swelling that can occur in some of your joints. Gout is a type of arthritis. This condition is caused by having too much uric acid in your body. Uric acid is a chemical that forms when your body breaks down substances called purines. Purines are important for building body proteins. When your body has too much uric acid, sharp crystals can form and build up inside your joints. This causes pain and swelling. Gout attacks can happen quickly and be very painful (acute gout). Over time, the attacks can affect more joints and become more frequent (chronic gout). Gout can also cause uric acid to build up under your skin and inside your kidneys. What are the causes? This condition is caused by too much uric acid in your blood. This can occur because:  Your kidneys do not remove enough uric acid from your blood. This is the most common cause.  Your body makes too much uric acid. This can occur with some cancers and cancer treatments. It can also occur if your body is breaking down too many red blood cells (hemolytic anemia).  You eat too many foods that are high in purines. These foods include organ meats and some seafood. Alcohol, especially beer, is also high in purines.  A gout attack may be triggered by trauma or stress. What increases the risk? This condition is more likely to develop in people who:  Have a family history of gout.  Are female and middle-aged.  Are female and have gone through menopause.  Are obese.  Frequently drink alcohol, especially beer.  Are dehydrated.  Lose weight too quickly.  Have an organ transplant.  Have lead poisoning.  Take certain medicines, including aspirin, cyclosporine, diuretics, levodopa, and niacin.  Have kidney  disease or psoriasis.  What are the signs or symptoms? An attack of acute gout happens quickly. It usually occurs in just one joint. The most common place is the big toe. Attacks often start at night. Other joints that may be affected include joints of the feet, ankle, knee, fingers, wrist, or elbow. Symptoms may include:  Severe pain.  Warmth.  Swelling.  Stiffness.  Tenderness. The affected joint may be very painful to touch.  Shiny, red, or purple skin.  Chills and fever.  Chronic gout may cause symptoms more frequently. More joints may be involved. You may also have white or yellow lumps (tophi) on your hands or feet or in other areas near your joints. How is this diagnosed? This condition is diagnosed based on your symptoms, medical history, and physical exam. You may have tests, such as:  Blood tests to measure uric acid levels.  Removal of joint fluid with a needle (aspiration) to look for uric acid crystals.  X-rays to look for joint damage.  How is this treated? Treatment for this condition has two phases: treating an acute attack and preventing future attacks. Acute gout treatment may include medicines to reduce pain and swelling, including:  NSAIDs.  Steroids. These are strong anti-inflammatory medicines that can be taken by mouth (orally) or injected into a joint.  Colchicine. This medicine relieves pain and swelling when it is taken soon after an attack. It can be given orally or through an IV tube.  Preventive treatment may include:  Daily use of smaller doses of NSAIDs or colchicine.  Use of a medicine that reduces uric acid levels in your blood.  Changes to your diet. You may need to see a specialist about healthy eating (dietitian).  Follow these instructions at home: During a Gout Attack  If directed, apply ice to the affected area: ? Put ice in a plastic bag. ? Place a towel between your skin and the bag. ? Leave the ice on for 20 minutes, 2-3  times a day.  Rest the joint as much as possible. If the affected joint is in your leg, you may be given crutches to use.  Raise (elevate) the affected joint above the level of your heart as often as possible.  Drink enough fluids to keep your urine clear or pale yellow.  Take over-the-counter and prescription medicines only as told by your health care provider.  Do not drive or operate heavy machinery while taking prescription pain medicine.  Follow instructions from your health care provider about eating or drinking restrictions.  Return to your normal activities as told by your health care provider. Ask your health care provider what activities are safe for you. Avoiding Future Gout Attacks  Follow a low-purine diet as told by your dietitian or health care provider. Avoid foods and drinks that are high in purines, including liver, kidney, anchovies, asparagus, herring, mushrooms, mussels, and beer.  Limit alcohol intake to no more than 1 drink a day for nonpregnant women and 2 drinks a day for men. One drink equals 12 oz of beer, 5 oz of wine, or 1 oz of hard liquor.  Maintain a healthy weight or lose weight if you are overweight. If you want to lose weight, talk with your health care provider. It is important that you do not lose weight too quickly.  Start or maintain an exercise program as told by your health care provider.  Drink enough fluids to keep your urine clear or pale yellow.  Take over-the-counter and prescription medicines only as told by your health care provider.  Keep all follow-up visits as told by your health care provider. This is important. Contact a health care provider if:  You have another gout attack.  You continue to have symptoms of a gout attack after10 days of treatment.  You have side effects from your medicines.  You have chills or a fever.  You have burning pain when you urinate.  You have pain in your lower back or belly. Get help right  away if:  You have severe or uncontrolled pain.  You cannot urinate. This information is not intended to replace advice given to you by your health care provider. Make sure you discuss any questions you have with your health care provider. Document Released: 01/03/2000 Document Revised: 06/13/2015 Document Reviewed: 10/18/2014 Elsevier Interactive Patient Education  [employer].

## 2017-11-15 NOTE — Assessment & Plan Note (Signed)
Acute gout flare of left ankle likely secondary to renal impairment Will prescribe prednisone taper, which she has been in the past for her gout flares and has been effective She will discuss with her nephrologist restarting allopurinol  Call if no improvement

## 2017-11-17 LAB — PANCREATIC ELASTASE, FECAL: Pancreatic Elastase, Fecal: >500 ug Elast./g (ref >200)

## 2017-11-22 ENCOUNTER — Telehealth: Payer: Self-pay

## 2017-11-22 ENCOUNTER — Encounter: Payer: Self-pay | Admitting: Internal Medicine

## 2017-11-22 ENCOUNTER — Ambulatory Visit (INDEPENDENT_AMBULATORY_CARE_PROVIDER_SITE_OTHER): Payer: 59 | Admitting: Internal Medicine

## 2017-11-22 ENCOUNTER — Telehealth: Payer: Self-pay | Admitting: Internal Medicine

## 2017-11-22 VITALS — BP 130/94 | HR 76 | Temp 97.7°F | Resp 16 | Wt 276.0 lb

## 2017-11-22 DIAGNOSIS — Z794 Long term (current) use of insulin: Secondary | ICD-10-CM

## 2017-11-22 DIAGNOSIS — E1129 Type 2 diabetes mellitus with other diabetic kidney complication: Secondary | ICD-10-CM | POA: Diagnosis not present

## 2017-11-22 LAB — POCT GLYCOSYLATED HEMOGLOBIN (HGB A1C): Hemoglobin A1C: 10.4 % — AB (ref 4.0–5.6)

## 2017-11-22 LAB — GLUCOSE, POCT (MANUAL RESULT ENTRY): POC Glucose: 360 mg/dl — AB (ref 70–99)

## 2017-11-22 LAB — CLOSTRIDIUM DIFFICILE BY PCR: Toxigenic C. Difficile by PCR: NEGATIVE

## 2017-11-22 LAB — SPECIMEN STATUS REPORT

## 2017-11-22 MED ORDER — INSULIN PEN NEEDLE 32G X 6 MM MISC: 1.0000 | Freq: Three times a day (TID) | 1 refills | Status: DC

## 2017-11-22 MED ORDER — INSULIN GLARGINE 300 UNIT/ML ~~LOC~~ SOPN: 50.0000 [IU] | PEN_INJECTOR | Freq: Every day | SUBCUTANEOUS | 1 refills | Status: DC

## 2017-11-22 MED ORDER — INSULIN LISPRO 200 UNIT/ML ~~LOC~~ SOPN: 10.0000 [IU] | PEN_INJECTOR | Freq: Three times a day (TID) | SUBCUTANEOUS | 1 refills | Status: DC

## 2017-11-22 NOTE — Telephone Encounter (Signed)
Would you like patient to come in sooner

## 2017-11-22 NOTE — Telephone Encounter (Signed)
LVM for patient to call back for appt so be seen ASAP

## 2017-11-22 NOTE — Telephone Encounter (Signed)
Yes.. Pt need to make sooner appt will need to see provider for diabetes management.Marland KitchenJohny Davis

## 2017-11-22 NOTE — Patient Instructions (Signed)

## 2017-11-22 NOTE — Telephone Encounter (Signed)
Copied from [place] [phone]. Topic: Quick Communication - See Telephone Encounter >> Nov 22, 2017  9:20 AM Blase Mess A wrote: CRM for notification. See Telephone encounter for: 11/22/17.Patient is calling because her Dialysis center called regarding her blood sugar was over 500 from her labs. Patient is calling to see if Dr. Ronnald Ramp can call her a prescription in for insulin or does she need to come in for an appt? Please advise [phone]

## 2017-11-22 NOTE — Telephone Encounter (Signed)
Patient needs to schedule appt with dr Ronnald Ramp today per dr Ronnald Ramp, blood sugar over 500---can talk with tamara,RN at [place] office if needed

## 2017-11-22 NOTE — Telephone Encounter (Signed)
Spoke to husband for patient to make appt

## 2017-11-22 NOTE — Progress Notes (Signed)
Subjective:  Patient ID: Brenda Davis, female    DOB: Aug 27, 1965  Age: 52 y.o. MRN: [id number]  CC: Diabetes   HPI Brenda Davis presents for concerns about her blood sugar.  For the last week she has been getting blood sugars as high as 500.  She is currently on hemodialysis but is not receiving any treatment for hyperglycemia.  She denies polys or visual disturbance.  She suffers from chronic diarrhea and vomiting due to gastroparesis but denies any recent episodes of abdominal pain or loss of appetite.  She recently had an episode of pancreatitis.  Outpatient Medications Prior to Visit  Medication Sig Dispense Refill  . AURYXIA 1 GM 210 MG(Fe) tablet Take 210 mg by mouth 3 (three) times daily with meals.   3  . calcium acetate (PHOSLO) 667 MG capsule Take 2 capsules (1,334 mg total) by mouth 3 (three) times daily with meals. 180 capsule 0  . carvedilol (COREG) 6.25 MG tablet Take 1 tablet (6.25 mg total) by mouth 2 (two) times daily with a meal. 60 tablet 1  . Cyanocobalamin (B-12 PO) Take 1 tablet by mouth daily.    . cyclobenzaprine (FLEXERIL) 10 MG tablet Take 0.5-1 tablets (5-10 mg total) by mouth 2 (two) times daily as needed for muscle spasms. 20 tablet 0  . folic acid (FOLVITE) 1 MG tablet Take 1 tablet (1 mg total) by mouth daily. 90 tablet 1  . gentamicin cream (GARAMYCIN) 0.1 % APPLY TO EXIT SITE DAILY AS DIRECTED  4  . multivitamin (RENA-VIT) TABS tablet Take 1 tablet by mouth at bedtime. 30 tablet 0  . ondansetron (ZOFRAN ODT) 4 MG disintegrating tablet Take 1 tablet (4 mg total) by mouth every 6 (six) hours as needed for nausea or vomiting. 30 tablet 1  . polyethylene glycol (MIRALAX / GLYCOLAX) packet Take 17 g by mouth daily as needed. 14 each 0  . thiamine (VITAMIN B-1) 100 MG tablet Take 1 tablet (100 mg total) by mouth daily. 90 tablet 1  . predniSONE (DELTASONE) 10 MG tablet Take 4 tabs po qd x 3 days, then 3 tabs po qd x 3 days, then 2 tabs po  qd x 3 days, then 1 tab po qd x 3 days 30 tablet 0   No facility-administered medications prior to visit.     ROS Review of Systems  Constitutional: Positive for unexpected weight change (wt gain). Negative for appetite change, chills, diaphoresis and fatigue.  HENT: Negative.   Eyes: Negative.  Negative for visual disturbance.  Respiratory: Negative for cough, chest tightness and shortness of breath.   Cardiovascular: Negative for chest pain, palpitations and leg swelling.  Gastrointestinal: Positive for diarrhea, nausea and vomiting. Negative for abdominal pain and rectal pain.  Endocrine: Negative for polydipsia, polyphagia and polyuria.  Genitourinary: Negative.  Negative for difficulty urinating.  Musculoskeletal: Negative.  Negative for arthralgias and myalgias.  Skin: Negative.   Neurological: Negative.  Negative for dizziness, weakness, light-headedness and headaches.  Hematological: Negative for adenopathy. Does not bruise/bleed easily.  Psychiatric/Behavioral: Negative.     Objective:  BP (!) 130/94   Pulse 76   Temp 97.7 F (36.5 C) (Oral)   Resp 16   Wt 276 lb (125.2 kg)   SpO2 98%   BMI 45.93 kg/m   BP Readings from Last 3 Encounters:  11/22/17 (!) 130/94  11/15/17 112/64  11/04/17 110/80    Wt Readings from Last 3 Encounters:  11/22/17 276 lb (125.2 kg)  11/15/17  264 lb (119.7 kg)  11/04/17 268 lb (121.6 kg)    Physical Exam  Constitutional: She is oriented to person, place, and time. No distress.  HENT:  Mouth/Throat: Oropharynx is clear and moist. No oropharyngeal exudate.  Eyes: Conjunctivae are normal. No scleral icterus.  Neck: Normal range of motion. Neck supple. No JVD present. No thyromegaly present.  Cardiovascular: Normal rate, regular rhythm and normal heart sounds. Exam reveals no gallop.  No murmur heard. Pulmonary/Chest: Effort normal and breath sounds normal. No respiratory distress. She has no wheezes. She has no rales.  Abdominal:  Soft. Normal appearance and bowel sounds are normal. She exhibits no mass. There is no hepatosplenomegaly. There is no tenderness.  Musculoskeletal: Normal range of motion. She exhibits no edema, tenderness or deformity.  Lymphadenopathy:    She has no cervical adenopathy.  Neurological: She is alert and oriented to person, place, and time.  Skin: Skin is warm and dry. She is not diaphoretic. No pallor.  Vitals reviewed.   Lab Results  Component Value Date   WBC 8.8 10/28/2017   HGB 11.1 (L) 10/28/2017   HCT 34.4 (L) 10/28/2017   PLT 321.0 10/28/2017   GLUCOSE 389 (H) 10/28/2017   CHOL 182 08/25/2017   TRIG 118 08/25/2017   HDL 32 (L) 08/25/2017   LDLDIRECT 161.6 11/28/2012   LDLCALC 126 (H) 08/25/2017   ALT 16 10/28/2017   AST 13 10/28/2017   NA 135 10/28/2017   K 3.5 10/28/2017   CL 94 (L) 10/28/2017   CREATININE 11.95 (HH) 10/28/2017   BUN 46 (H) 10/28/2017   CO2 27 10/28/2017   TSH 2.020 09/24/2016   INR 1.00 05/06/2016   HGBA1C 10.4 (A) 11/22/2017   MICROALBUR 159.00 (H) 09/17/2009    Ct Abdomen Pelvis W Contrast  Result Date: 11/11/2017 CLINICAL DATA:  Chronic diarrhea. EXAM: CT ABDOMEN AND PELVIS WITH CONTRAST TECHNIQUE: Multidetector CT imaging of the abdomen and pelvis was performed using the standard protocol following bolus administration of intravenous contrast. CONTRAST:  113mL ISOVUE-300 IOPAMIDOL (ISOVUE-300) INJECTION 61% COMPARISON:  08/25/2017 FINDINGS: Lower chest: The lung bases are clear of acute process. No pleural effusion or pulmonary lesions. The heart is normal in size. No pericardial effusion. The distal esophagus and aorta are unremarkable. Hepatobiliary: No focal hepatic lesions or intrahepatic biliary dilatation. The liver contour is slightly irregular and the hepatic fissures are slightly prominent. These findings suggest cirrhosis. The gallbladder is surgically absent. No common bile duct dilatation. Pancreas: No mass, inflammation or ductal  dilatation. Spleen: Normal size.  No focal lesions. Adrenals/Urinary Tract: The adrenal glands and kidneys are stable. The kidneys are small and the patient is on peritoneal dialysis. No worrisome renal lesions or hydronephrosis. The bladder is unremarkable. Stomach/Bowel: Postoperative changes involving the stomach from a probable prior gastric stapling procedure. The duodenum, small bowel and colon are unremarkable. No acute inflammatory changes, mass lesions or obstructive findings. The terminal ileum and appendix are normal. Scattered colonic diverticulosis without findings for acute diverticulitis. Vascular/Lymphatic: Scattered atherosclerotic calcifications involving the aorta but no aneurysm. The branch vessels are patent. The major venous structures are patent. No mesenteric or retroperitoneal mass or adenopathy. Reproductive: The uterus and ovaries are normal. Other: A peritoneal dialysis catheter is noted coiled just above the bladder. There is associated tiny amount of free air under the right hemidiaphragm. Stable fat containing lesion in right omental area, likely remote omental infarct. Musculoskeletal: No significant bony findings. IMPRESSION: 1. No acute abdominal/pelvic findings, mass lesions or adenopathy.  2. Status post cholecystectomy.  No biliary dilatation. 3. Suspect cirrhotic changes involving the liver. 4. Surgical changes from gastric stapling procedure. No complicating features. 5. Peritoneal dialysis catheter in good position without complicating features. Electronically Signed   By: Marijo Sanes M.D.   On: 11/11/2017 21:35    Assessment & Plan:   Kanisha was seen today for diabetes.  Diagnoses and all orders for this visit:  Type 2 diabetes mellitus with other diabetic kidney complication, with long-term current use of insulin ([place])- She has severe hyperglycemia and her A1c is at 10.4%.  I have asked her to start treating this with basal/bolus insulin.  She was given her first  dose of Toujeo in the office today. -     Insulin Lispro (HUMALOG KWIKPEN) 200 UNIT/ML SOPN; Inject 10 Units into the skin 3 (three) times daily with meals. -     Insulin Glargine (TOUJEO SOLOSTAR) 300 UNIT/ML SOPN; Inject 50 Units into the skin daily. -     Insulin Pen Needle (NOVOFINE) 32G X 6 MM MISC; 1 Act by Does not apply route 4 (four) times daily -  with meals and at bedtime. -     POCT HgB A1C -     POCT Glucose (CBG)   I have discontinued [place] Lore. Goggans-Brockman's predniSONE. I am also having her start on Insulin Lispro, Insulin Glargine, and Insulin Pen Needle. Additionally, I am having her maintain her calcium acetate, multivitamin, polyethylene glycol, AURYXIA, gentamicin cream, cyclobenzaprine, thiamine, folic acid, Cyanocobalamin (B-12 PO), carvedilol, and ondansetron.  Meds ordered this encounter  Medications  . Insulin Lispro (HUMALOG KWIKPEN) 200 UNIT/ML SOPN    Sig: Inject 10 Units into the skin 3 (three) times daily with meals.    Dispense:  9 mL    Refill:  1  . Insulin Glargine (TOUJEO SOLOSTAR) 300 UNIT/ML SOPN    Sig: Inject 50 Units into the skin daily.    Dispense:  4.5 mL    Refill:  1  . Insulin Pen Needle (NOVOFINE) 32G X 6 MM MISC    Sig: 1 Act by Does not apply route 4 (four) times daily -  with meals and at bedtime.    Dispense:  300 each    Refill:  1     Follow-up: Return in about 3 months (around 02/22/2018).  Scarlette Calico, MD

## 2017-11-22 NOTE — Telephone Encounter (Signed)
LVM or patient to sch appt TODAY with Dr. Ronnald Ramp per Dr. Ronnald Ramp

## 2017-11-23 ENCOUNTER — Telehealth: Payer: Self-pay | Admitting: Internal Medicine

## 2017-11-23 NOTE — Telephone Encounter (Signed)
Copied from [place] [id number]. Topic: General - Other >> Nov 23, 2017 10:49 AM Lennox Solders wrote: Reason for CRM: pt is calling and she needs PA on humalog kwikpen and toujeo solostar please call [phone] to obtain PA . [facility] on [address]

## 2017-11-24 ENCOUNTER — Ambulatory Visit (INDEPENDENT_AMBULATORY_CARE_PROVIDER_SITE_OTHER): Payer: 59 | Admitting: Gastroenterology

## 2017-11-24 ENCOUNTER — Encounter: Payer: Self-pay | Admitting: Gastroenterology

## 2017-11-24 ENCOUNTER — Other Ambulatory Visit: Payer: Self-pay

## 2017-11-24 VITALS — BP 136/80 | HR 80 | Ht 64.0 in | Wt 274.1 lb

## 2017-11-24 DIAGNOSIS — K7460 Unspecified cirrhosis of liver: Secondary | ICD-10-CM

## 2017-11-24 DIAGNOSIS — R748 Abnormal levels of other serum enzymes: Secondary | ICD-10-CM | POA: Diagnosis not present

## 2017-11-24 NOTE — Progress Notes (Signed)
Looks like a Dr. Ardis Hughs patient.  It looks like I inadvertently staffed your previous office evaluation.  Please route to him if that is appropriate.  Thanks

## 2017-11-24 NOTE — Patient Instructions (Signed)
  Your provider has requested that you go to the basement level for lab work before leaving today. Press "B" on the elevator. The lab is located at the first door on the left as you exit the elevator.    We will call you with results and plans.    I appreciate the opportunity to care for you. Alonza Bogus, PA-C

## 2017-11-24 NOTE — Telephone Encounter (Signed)
Toujeo Key: [id number] Humalog Kwikpen Key: [id number]

## 2017-11-24 NOTE — Progress Notes (Signed)
11/24/2017 Brenda Davis [id number] 03-24-1965   HISTORY OF PRESENT ILLNESS:  This is a 52 year old female who was seen by me last month for evaluation of abdominal pain, nausea, vomiting, diarrhea.  Stool studies were negative including a pancreatic fecal elastase.  Her lipase was elevated over 200, so in light of her symptoms and this finding, CT scan was ordered.  CT scan of the abdomen and pelvis with contrast did not show any abnormalities to account for her symptoms.  It did indicate "suspected cirrhosis".  This was new to her so she want to come in to discuss further.  At this point her other symptoms have about resolved.  She is not having any abdominal pain.  She does have nausea each morning, but Zofran helps with that.  Stools are returning to normal as well.    She denies alcohol use.  No family history of liver disease except for an uncle who is a very heavy drinker.  She has recently been placed back on insulin and had some blood sugars over 500 just recently.   Past Medical History:  Diagnosis Date  . Anemia   . Chronic kidney disease    STAGE 3 CHRONIC KIDNEY DISEASE SECONDARY TO DIABETIC GLOMERULOSCLEROSIS AND UNCONTROLLED HYPERTENSION - PER OFFICE NOTES DR. [facility].  Marland Kitchen ESRD on dialysis [facility])    "MWF; [address] (05/06/2016)  . Gout   . HCAP (healthcare-associated pneumonia) 05/06/2016  . History of blood transfusion    "related to surgery"  . Hyperlipidemia   . Hypertension   . Morbid obesity ([place])   . Pain    LEFT SHOULDER PAIN - WAS SEEN AT AN URGENT CARE - GIVEN SLING FOR COMFORT AND TOLD ROM AS TOLERATED.  Marland Kitchen Palpitations 09/24/2016  . Type II diabetes mellitus ([place])    "gastric sleeve OR corrected this" (05/06/2016)   Past Surgical History:  Procedure Laterality Date  . AV FISTULA PLACEMENT Left 01/03/2015   Procedure: BRACHIAL CEPHALIC ARTERIOVENOUS  FISTULA CREATION LEFT ARM;  Surgeon: Angelia Mould, MD;   Location: [place];  Service: Vascular;  Laterality: Left;  . CARPAL TUNNEL RELEASE Right   . [place]  . CHOLECYSTECTOMY  09/14/2017  . ESOPHAGOGASTRODUODENOSCOPY N/A 09/04/2012   Procedure: ESOPHAGOGASTRODUODENOSCOPY (EGD);  Surgeon: Shann Medal, MD;  Location: [facility];  Service: General;  Laterality: N/A;  PF  . ESOPHAGOGASTRODUODENOSCOPY (EGD) WITH ESOPHAGEAL DILATION N/A 09/29/2012   Procedure: ESOPHAGOGASTRODUODENOSCOPY (EGD) WITH ESOPHAGEAL DILATION;  Surgeon: Milus Banister, MD;  Location: [facility];  Service: Endoscopy;  Laterality: N/A;  . INSERTION OF DIALYSIS CATHETER N/A 01/03/2015   Procedure: INSERTION OF DIALYSIS CATHETER RIGHT INTERNAL JUGULAR;  Surgeon: Angelia Mould, MD;  Location: [place];  Service: Vascular;  Laterality: N/A;  . LAPAROSCOPIC GASTRIC SLEEVE RESECTION N/A 07/19/2012   Procedure: LAPAROSCOPIC SLEEVE GASTRECTOMY with EGD;  Surgeon: Madilyn Hook, DO;  Location: [facility];  Service: General;  Laterality: N/A;  laparoscopic sleeve gastrectomy with EGD  . PERIPHERAL VASCULAR CATHETERIZATION Left 05/23/2015   Procedure: Nolon Stalls;  Surgeon: Conrad West Burke, MD;  Location: [facility];  Service: Cardiovascular;  Laterality: Left;  upper aRM  . PERIPHERAL VASCULAR CATHETERIZATION Left 05/23/2015   Procedure: Peripheral Vascular Balloon Angioplasty;  Surgeon: Conrad , MD;  Location: [place];  Service: Cardiovascular;  Laterality: Left;  av fistula  . TUBAL LIGATION  1993  . UPPER GI ENDOSCOPY N/A 07/19/2012  Procedure: UPPER GI ENDOSCOPY;  Surgeon: Madilyn Hook, DO;  Location: [facility];  Service: General;  Laterality: N/A;    reports that she quit smoking about 10 years ago. Her smoking use included cigarettes. She has a 16.00 pack-year smoking history. She has never used smokeless tobacco. She reports that she does not drink alcohol or use drugs. family history includes Arthritis in her other; Diabetes in her father;  Hyperlipidemia in her other; Hypertension in her mother; Mitral valve prolapse in her mother. Allergies  Allergen Reactions  . Amlodipine Swelling    edema  . Clonidine Derivatives Swelling    Limbs swell  . Welchol [Colesevelam Hcl] Nausea Only      Outpatient Encounter Medications as of 11/24/2017  Medication Sig  . AURYXIA 1 GM 210 MG(Fe) tablet Take 210 mg by mouth 3 (three) times daily with meals.   . calcium acetate (PHOSLO) 667 MG capsule Take 2 capsules (1,334 mg total) by mouth 3 (three) times daily with meals.  . carvedilol (COREG) 6.25 MG tablet Take 1 tablet (6.25 mg total) by mouth 2 (two) times daily with a meal.  . Cyanocobalamin (B-12 PO) Take 1 tablet by mouth daily.  . cyclobenzaprine (FLEXERIL) 10 MG tablet Take 0.5-1 tablets (5-10 mg total) by mouth 2 (two) times daily as needed for muscle spasms.  . folic acid (FOLVITE) 1 MG tablet Take 1 tablet (1 mg total) by mouth daily.  Marland Kitchen gentamicin cream (GARAMYCIN) 0.1 % APPLY TO EXIT SITE DAILY AS DIRECTED  . Insulin Glargine (TOUJEO SOLOSTAR) 300 UNIT/ML SOPN Inject 50 Units into the skin daily.  . Insulin Lispro (HUMALOG KWIKPEN) 200 UNIT/ML SOPN Inject 10 Units into the skin 3 (three) times daily with meals.  . Insulin Pen Needle (NOVOFINE) 32G X 6 MM MISC 1 Act by Does not apply route 4 (four) times daily -  with meals and at bedtime.  . Multiple Vitamins-Minerals (PRORENAL + D) TABS Take 1 tablet by mouth daily.  . multivitamin (RENA-VIT) TABS tablet Take 1 tablet by mouth at bedtime.  . ondansetron (ZOFRAN ODT) 4 MG disintegrating tablet Take 1 tablet (4 mg total) by mouth every 6 (six) hours as needed for nausea or vomiting.  . polyethylene glycol (MIRALAX / GLYCOLAX) packet Take 17 g by mouth daily as needed.  . thiamine (VITAMIN B-1) 100 MG tablet Take 1 tablet (100 mg total) by mouth daily.   No facility-administered encounter medications on file as of 11/24/2017.      REVIEW OF SYSTEMS  : All other systems  reviewed and negative except where noted in the History of Present Illness.   PHYSICAL EXAM: BP 136/80 (BP Location: Right Arm, Patient Position: Sitting, Cuff Size: Normal)   Pulse 80   Ht 5' 4"  (1.626 m) Comment: height measured without shoes  Wt 274 lb 2 oz (124.3 kg)   BMI 47.05 kg/m  General: Well developed black female in no acute distress Head: Normocephalic and atraumatic Eyes:  Sclerae anicteric, conjunctiva pink. Ears: Normal auditory acuity Lungs: Clear throughout to auscultation; no increased WOB. Heart: Regular rate and rhythm; no M/R/G. Abdomen: Soft, non-distended.  BS present.  Non-tender. Musculoskeletal: Symmetrical with no gross deformities  Skin: No lesions on visible extremities Extremities: No edema  Neurological: Alert oriented x 4, grossly non-focal Psychological:  Alert and cooperative. Normal mood and affect  ASSESSMENT AND PLAN: *Newly diagnosed cirrhosis:  CT scan done for other reasons showed "supsected cirrhosis".  She was brought in to discuss this.  Likely from fatty liver as she is overweight with DM, blood sugars not controlled well recently.  No ETOH.  Will check other liver serologies to rule out other causes of chronic liver disease.  Platelets are normal but will also check PT/INR and AFP. *Elevated lipase:  CT scan showed no pancreatic abnormalities, but last lipase was over 200.  Will recheck today to be sure that it has normalized.   CC:  [name], MD

## 2017-11-25 NOTE — Telephone Encounter (Signed)
Both PA's were denied.

## 2017-11-26 ENCOUNTER — Other Ambulatory Visit (INDEPENDENT_AMBULATORY_CARE_PROVIDER_SITE_OTHER): Payer: 59

## 2017-11-26 DIAGNOSIS — K7460 Unspecified cirrhosis of liver: Secondary | ICD-10-CM | POA: Diagnosis not present

## 2017-11-26 DIAGNOSIS — R748 Abnormal levels of other serum enzymes: Secondary | ICD-10-CM

## 2017-11-26 LAB — PROTIME-INR
INR: 1 ratio (ref 0.8–1.0)
Prothrombin Time: 11.4 s (ref 9.6–13.1)

## 2017-11-26 LAB — LIPASE: Lipase: 137 U/L — ABNORMAL HIGH (ref 11.0–59.0)

## 2017-11-26 LAB — IBC PANEL
Iron: 29 ug/dL — ABNORMAL LOW (ref 42–145)
Saturation Ratios: 12.4 % — ABNORMAL LOW (ref 20.0–50.0)
Transferrin: 167 mg/dL — ABNORMAL LOW (ref 212.0–360.0)

## 2017-11-26 LAB — FERRITIN: Ferritin: 1494.1 ng/mL — ABNORMAL HIGH (ref 10.0–291.0)

## 2017-11-30 NOTE — Telephone Encounter (Signed)
Patient is calling back and states she checked with her pharmacy and would like to know if  Basaglar or Levimer can be called in place of Toujeo as that is what her insurance will cover.   Also would like to know if Novolog or Claiborne Billings can be called in for the [place] as that is what her insurance will cover.

## 2017-12-01 ENCOUNTER — Other Ambulatory Visit: Payer: Self-pay | Admitting: Internal Medicine

## 2017-12-01 DIAGNOSIS — Z794 Long term (current) use of insulin: Principal | ICD-10-CM

## 2017-12-01 DIAGNOSIS — E1129 Type 2 diabetes mellitus with other diabetic kidney complication: Secondary | ICD-10-CM

## 2017-12-01 MED ORDER — INSULIN ASPART 100 UNIT/ML FLEXPEN: 10.0000 [IU] | PEN_INJECTOR | Freq: Three times a day (TID) | SUBCUTANEOUS | 5 refills | Status: DC

## 2017-12-01 MED ORDER — BASAGLAR KWIKPEN 100 UNIT/ML ~~LOC~~ SOPN: 50.0000 [IU] | PEN_INJECTOR | Freq: Every day | SUBCUTANEOUS | 1 refills | Status: DC

## 2017-12-01 NOTE — Telephone Encounter (Signed)
done

## 2017-12-01 NOTE — Telephone Encounter (Signed)
Detailed message left for pt informing new insulins have been sent to her pharmacy.   The toujeo was replaced with basaglar and the humalog was replaced with novolog.

## 2017-12-01 NOTE — Telephone Encounter (Signed)
PA's for the Toujeo and Humalog were both denied.   Can an alternative be sent in?   Toujeo changed to basaglar or levemir Humalog changed to novolog or fiasp.

## 2017-12-02 ENCOUNTER — Ambulatory Visit: Payer: 59 | Admitting: Internal Medicine

## 2017-12-02 DIAGNOSIS — Z0289 Encounter for other administrative examinations: Secondary | ICD-10-CM

## 2017-12-02 LAB — HEPATITIS C ANTIBODY
Hepatitis C Ab: NONREACTIVE
SIGNAL TO CUT-OFF: 0.01 (ref <1.00)

## 2017-12-02 LAB — AFP TUMOR MARKER: AFP-Tumor Marker: 1.9 ng/mL

## 2017-12-02 LAB — HEPATITIS B SURFACE ANTIGEN: Hepatitis B Surface Ag: NONREACTIVE

## 2017-12-02 LAB — MITOCHONDRIAL ANTIBODIES: Mitochondrial M2 Ab, IgG: <=20 U

## 2017-12-02 LAB — HEPATITIS B SURFACE ANTIBODY,QUALITATIVE: Hep B S Ab: REACTIVE — AB

## 2017-12-02 LAB — ANTI-SMOOTH MUSCLE ANTIBODY, IGG: Actin (Smooth Muscle) Antibody (IGG): <20 U (ref <20)

## 2017-12-02 LAB — ALPHA-1-ANTITRYPSIN: A-1 Antitrypsin, Ser: 157 mg/dL (ref 83–199)

## 2017-12-02 LAB — ANA: Anti Nuclear Antibody(ANA): NEGATIVE

## 2017-12-02 LAB — CERULOPLASMIN: Ceruloplasmin: 30 mg/dL (ref 18–53)

## 2017-12-07 ENCOUNTER — Other Ambulatory Visit: Payer: Self-pay | Admitting: Internal Medicine

## 2017-12-23 ENCOUNTER — Ambulatory Visit (HOSPITAL_COMMUNITY)
Admission: EM | Admit: 2017-12-23 | Discharge: 2017-12-23 | Disposition: A | Discharge status: Home / Self Care | Payer: 59 | Attending: Family Medicine | Admitting: Family Medicine

## 2017-12-23 ENCOUNTER — Ambulatory Visit (INDEPENDENT_AMBULATORY_CARE_PROVIDER_SITE_OTHER): Payer: 59

## 2017-12-23 ENCOUNTER — Encounter (HOSPITAL_COMMUNITY): Payer: Self-pay

## 2017-12-23 DIAGNOSIS — M5020 Other cervical disc displacement, unspecified cervical region: Secondary | ICD-10-CM | POA: Diagnosis not present

## 2017-12-23 MED ORDER — KETOROLAC TROMETHAMINE 60 MG/2ML IM SOLN
INTRAMUSCULAR | Status: AC
  Filled 2017-12-23: qty 2

## 2017-12-23 MED ORDER — TRAMADOL HCL 50 MG PO TABS: 50.0000 mg | ORAL_TABLET | Freq: Four times a day (QID) | ORAL | 0 refills | Status: DC | PRN

## 2017-12-23 MED ORDER — DICLOFENAC SODIUM 50 MG PO TBEC: 50.0000 mg | DELAYED_RELEASE_TABLET | Freq: Three times a day (TID) | ORAL | 0 refills | Status: DC | PRN

## 2017-12-23 MED ORDER — KETOROLAC TROMETHAMINE 60 MG/2ML IM SOLN
60.0000 mg | Freq: Once | INTRAMUSCULAR | Status: AC
  Administered 2017-12-23: 60 mg via INTRAMUSCULAR

## 2017-12-23 MED ORDER — METHYLPREDNISOLONE 4 MG PO TBPK: ORAL_TABLET | ORAL | 0 refills | Status: DC

## 2017-12-23 MED ORDER — CYCLOBENZAPRINE HCL 10 MG PO TABS: 10.0000 mg | ORAL_TABLET | Freq: Two times a day (BID) | ORAL | 0 refills | Status: DC | PRN

## 2017-12-23 NOTE — ED Provider Notes (Signed)
[facility]    CSN: [id number] Arrival date & time: 12/23/17  5784     History   Chief Complaint Chief Complaint  Patient presents with  . Neck Pain    HPI Brenda Davis is a 52 y.o. female.   Subjective:   Brenda Davis is a 52 y.o. female who presents for evaluation and treatment of neck pain. Onset of symptoms was 4 days ago and has been gradually worsening since that time. Current symptoms are pain in the left side and right side of neck, left greater than right that is described as aching, sharp and shooting in character and 10/10 in severity. Patient reports no neurologic symptoms in the upper extremities. Patient denies any neurologic symptoms in the upper extremities. Patient denies any trauma or known injury. Patient noted the pain upon waking up from a nap on Sunday afternoon. She initially thought that she may have "slept wrong" but has never had pain like this to last so long in the past. The patient has had no prior neck problems.  Previous treatments include: medication: NSAID: ibuprofen 800 mg, muscle relaxant: flexeril and heat with no relief in symptoms.   The following portions of the patient's history were reviewed and updated as appropriate: allergies, current medications, past family history, past medical history, past social history, past surgical history and problem list.        Past Medical History:  Diagnosis Date  . Anemia   . Chronic kidney disease    STAGE 3 CHRONIC KIDNEY DISEASE SECONDARY TO DIABETIC GLOMERULOSCLEROSIS AND UNCONTROLLED HYPERTENSION - PER OFFICE NOTES DR. Florene Glen [facility].  Marland Kitchen ESRD on dialysis [facility])    "MWF; [address] (05/06/2016)  . Gout   . HCAP (healthcare-associated pneumonia) 05/06/2016  . History of blood transfusion    "related to surgery"  . Hyperlipidemia   . Hypertension   . Morbid obesity ([place])   . Pain    LEFT SHOULDER PAIN - WAS SEEN AT AN URGENT CARE - GIVEN  SLING FOR COMFORT AND TOLD ROM AS TOLERATED.  Marland Kitchen Palpitations 09/24/2016  . Type II diabetes mellitus ([place])    "gastric sleeve OR corrected this" (05/06/2016)    Patient Active Problem List   Diagnosis Date Noted  . Hepatic cirrhosis ([place]) 11/24/2017  . Omental infarction ([place]) 08/25/2017  . ESRD on peritoneal dialysis ([place]) 08/25/2017  . DM (diabetes mellitus), type 2 with renal complications ([place]) 69/62/9528  . Deficiency anemia 08/24/2017  . Elevated lipase 08/24/2017  . Chronic heart failure with preserved ejection fraction ([place]) 06/22/2017  . Hypertension 09/15/2016  . Sensorineural hearing loss (SNHL), bilateral 06/29/2016  . Allergic rhinitis 06/27/2016  . Severe persistent asthma with exacerbation 05/12/2016  . Anemia due to chronic kidney disease 05/06/2016  . Routine general medical examination at a health care facility 12/05/2015  . Cervical cancer screening 12/05/2015  . Visit for screening mammogram 01/23/2015  . Sinus pause 01/04/2015  . Gout due to renal impairment 09/13/2014  . Thiamine deficiency 12/02/2012  . Meralgia paraesthetica 11/28/2012  . S/P laparoscopic sleeve gastrectomy 11/28/2012  . B12 deficiency anemia 11/28/2012  . Hyperlipidemia with target LDL less than 100 04/09/2008  . CKD (chronic kidney disease) stage V requiring chronic dialysis (MWF) 02/08/2008  . HYPERTENSION, BENIGN ESSENTIAL 01/20/1999    Past Surgical History:  Procedure Laterality Date  . AV FISTULA PLACEMENT Left 01/03/2015   Procedure: BRACHIAL CEPHALIC ARTERIOVENOUS  FISTULA CREATION LEFT ARM;  Surgeon: Angelia Mould, MD;  [facility];  Service: Vascular;  Laterality: Left;  . CARPAL TUNNEL RELEASE Right   . [place]  . CHOLECYSTECTOMY  09/14/2017  . ESOPHAGOGASTRODUODENOSCOPY N/A 09/04/2012   Procedure: ESOPHAGOGASTRODUODENOSCOPY (EGD);  Surgeon: Shann Medal, MD;  Location: [facility];  Service: General;  Laterality: N/A;  PF  .  ESOPHAGOGASTRODUODENOSCOPY (EGD) WITH ESOPHAGEAL DILATION N/A 09/29/2012   Procedure: ESOPHAGOGASTRODUODENOSCOPY (EGD) WITH ESOPHAGEAL DILATION;  Surgeon: Milus Banister, MD;  [facility];  Service: Endoscopy;  Laterality: N/A;  . INSERTION OF DIALYSIS CATHETER N/A 01/03/2015   Procedure: INSERTION OF DIALYSIS CATHETER RIGHT INTERNAL JUGULAR;  Surgeon: Angelia Mould, MD;  Location: [facility];  Service: Vascular;  Laterality: N/A;  . LAPAROSCOPIC GASTRIC SLEEVE RESECTION N/A 07/19/2012   Procedure: LAPAROSCOPIC SLEEVE GASTRECTOMY with EGD;  Surgeon: Madilyn Hook, DO;  [facility];  Service: General;  Laterality: N/A;  laparoscopic sleeve gastrectomy with EGD  . PERIPHERAL VASCULAR CATHETERIZATION Left 05/23/2015   Procedure: Nolon Stalls;  Surgeon: Conrad Mount Ephraim, MD;  Location: [facility];  Service: Cardiovascular;  Laterality: Left;  upper aRM  . PERIPHERAL VASCULAR CATHETERIZATION Left 05/23/2015   Procedure: Peripheral Vascular Balloon Angioplasty;  Surgeon: Conrad Modoc, MD;  Location: [facility];  Service: Cardiovascular;  Laterality: Left;  av fistula  . TUBAL LIGATION  1993  . UPPER GI ENDOSCOPY N/A 07/19/2012   Procedure: UPPER GI ENDOSCOPY;  Surgeon: Madilyn Hook, DO;  [facility];  Service: General;  Laterality: N/A;    OB History   None      Home Medications    Prior to Admission medications   Medication Sig Start Date End Date Taking? Authorizing Provider  AURYXIA 1 GM 210 MG(Fe) tablet Take 210 mg by mouth 3 (three) times daily with meals.  04/07/17   [provider]  calcium acetate (PHOSLO) 667 MG capsule Take 2 capsules (1,334 mg total) by mouth 3 (three) times daily with meals. 01/07/15   Eugenie Filler, MD  carvedilol (COREG) 6.25 MG tablet Take 1 tablet (6.25 mg total) by mouth 2 (two) times daily with a meal. 10/15/17   Marrian Salvage, FNP  Cyanocobalamin (B-12 PO) Take 1 tablet by mouth daily.    [provider]  cyclobenzaprine (FLEXERIL) 10 MG tablet Take 1 tablet (10 mg total) by mouth 2 (two) times daily as needed for muscle spasms. 12/23/17   Enrique Sack, FNP  diclofenac (VOLTAREN) 50 MG EC tablet Take 1 tablet (50 mg total) by mouth 3 (three) times daily as needed (PAIN). 12/23/17   Enrique Sack, FNP  folic acid (FOLVITE) 1 MG tablet Take 1 tablet (1 mg total) by mouth daily. 06/21/17   [name], MD  gentamicin cream (GARAMYCIN) 0.1 % APPLY TO EXIT SITE DAILY AS DIRECTED 04/26/17   [provider]  insulin aspart (NOVOLOG) 100 UNIT/ML FlexPen Inject 10 Units into the skin 3 (three) times daily with meals. 12/01/17   [name], MD  Insulin Glargine (BASAGLAR KWIKPEN) 100 UNIT/ML SOPN Inject 0.5 mLs (50 Units total) into the skin daily. 12/01/17   [name], MD  Insulin Pen Needle (NOVOFINE) 32G X 6 MM MISC 1 Act by Does not apply route 4 (four) times daily -  with meals and at bedtime. 11/22/17   [name], MD  methylPREDNISolone (MEDROL DOSEPAK) 4 MG TBPK tablet Take as directed 12/23/17   Enrique Sack, FNP  Multiple Vitamins-Minerals (PRORENAL +  D) TABS Take 1 tablet by mouth daily. 10/29/17   [provider]  multivitamin (RENA-VIT) TABS tablet Take 1 tablet by mouth at bedtime. 01/07/15   Eugenie Filler, MD  ondansetron (ZOFRAN ODT) 4 MG disintegrating tablet Take 1 tablet (4 mg total) by mouth every 6 (six) hours as needed for nausea or vomiting. 11/04/17   Zehr, Janett Billow D, PA-C  polyethylene glycol (MIRALAX / GLYCOLAX) packet Take 17 g by mouth daily as needed. 05/09/16   Eugenie Filler, MD  thiamine (VITAMIN B-1) 100 MG tablet Take 1 tablet (100 mg total) by mouth daily. 06/21/17   [name], MD  traMADol (ULTRAM) 50 MG tablet Take 1 tablet (50 mg total) by mouth every 6 (six) hours as needed for severe pain. 12/23/17   Enrique Sack, FNP    Family History Family History  Problem Relation Age of Onset  . Hypertension Mother     . Mitral valve prolapse Mother   . Diabetes Father   . Arthritis Other   . Hyperlipidemia Other   . Cancer Neg Hx   . Heart disease Neg Hx   . Kidney disease Neg Hx   . Stroke Neg Hx     Social History Social History   Tobacco Use  . Smoking status: Former Smoker    Packs/day: 1.00    Years: 16.00    Pack years: 16.00    Types: Cigarettes    Last attempt to quit: 2009    Years since quitting: 10.9  . Smokeless tobacco: Never Used  Substance Use Topics  . Alcohol use: Never    Frequency: Never  . Drug use: No     Allergies   Amlodipine; Clonidine derivatives; and Welchol [colesevelam hcl]   Review of Systems Review of Systems  Genitourinary: Negative.   Musculoskeletal: Positive for neck pain. Negative for back pain and neck stiffness.  Neurological: Negative.   All other systems reviewed and are negative.    Physical Exam Triage Vital Signs ED Triage Vitals  Enc Vitals Group     BP 12/23/17 0909 134/87     Pulse Rate 12/23/17 0909 80     Resp 12/23/17 0909 16     Temp 12/23/17 0909 97.7 F (36.5 C)     Temp Source 12/23/17 0909 Oral     SpO2 12/23/17 0909 100 %     Weight --      Height --      Head Circumference --      Peak Flow --      Pain Score 12/23/17 0912 10     Pain Loc --      Pain Edu? --      Excl. in [place]? --    No data found.  Updated Vital Signs BP 134/87 (BP Location: Right Arm)   Pulse 80   Temp 97.7 F (36.5 C) (Oral)   Resp 16   SpO2 100%   Visual Acuity Right Eye Distance:   Left Eye Distance:   Bilateral Distance:    Right Eye Near:   Left Eye Near:    Bilateral Near:     Physical Exam  Constitutional: She is oriented to person, place, and time. She appears well-developed and well-nourished.  Neck: Neck supple. Muscular tenderness present. No spinous process tenderness present. No neck rigidity. Decreased range of motion present. No edema and no erythema present.  Cardiovascular: Normal rate and regular rhythm.   Pulmonary/Chest: Effort normal and breath sounds normal.  Neurological: She is alert and oriented to person, place, and time. No cranial nerve deficit or sensory deficit.  Skin: Skin is warm and dry.  Psychiatric: She has a normal mood and affect.     UC Treatments / Results  Labs (all labs ordered are listed, but only abnormal results are displayed) Labs Reviewed - No data to display  EKG None  Radiology Dg Cervical Spine 2-3 Views  Result Date: 12/23/2017 CLINICAL DATA:  Neck pain and stiffness for several days, no known injury, initial encounter EXAM: CERVICAL SPINE - 2 VIEW COMPARISON:  None. FINDINGS: Seven cervical segments are well visualized. Vertebral body height is well maintained. No prevertebral soft tissue swelling is noted. Mild osteophytic changes are noted at C5-6. No acute fracture or acute facet abnormality is noted. Visualized lung apices are within normal limits. IMPRESSION: Mild degenerative change without acute abnormality. Electronically Signed   By: Inez Catalina M.D.   On: 12/23/2017 10:08    Procedures Procedures (including critical care time)  Medications Ordered in UC Medications  ketorolac (TORADOL) injection 60 mg (60 mg Intramuscular Given 12/23/17 1005)    Initial Impression / Assessment and Plan / UC Course  I have reviewed the triage vital signs and the nursing notes.  Pertinent labs & imaging results that were available during my care of the patient were reviewed by me and considered in my medical decision making (see chart for details).     52 year old female presenting with acute neck pain for the past 4 days after waking up for a nap.  No known trauma or injury.  Decreased range of motion due to pain noted on exam but no spinous process tenderness or rigidity noted.  X-ray of the cervical spine: Mild degenerative change without acute abnormality. Toradol 60 mg IM given in clinic.  Supportive care advised at this time.   Plan:  Discussed  the cervical pain, its course and treatment. Neurosurgeon distributed. Discussed appropriate exercises. Discussed appropriate use of ice and heat. NSAIDs per medication orders. Oral opioids started per medication orders. Muscle relaxants started per medication orders. Follow up with orthopedics if no improvement in symptoms   Today's evaluation has revealed no signs of a dangerous process. Discussed diagnosis with patient. Patient aware of their diagnosis, possible red flag symptoms to watch out for and need for close follow up. Patient understands verbal and written discharge instructions. Patient comfortable with plan and disposition.  Patient has a clear mental status at this time, good insight into illness (after discussion and teaching) and has clear judgment to make decisions regarding their care.  Documentation was completed with the aid of voice recognition software. Transcription may contain typographical errors.  Final Clinical Impressions(s) / UC Diagnoses   Final diagnoses:  Cervical discogenic pain syndrome     Discharge Instructions     Take medications as prescribed. Monitor your sugars closely while on the steroids. Cover with insulin as discussed. If your sugars began to run too high, you may stop the steroids as discussed. Alternate between ice and heat. Follow up with orthopedics if no change in symptoms.     ED Prescriptions    Medication Sig Dispense Auth. Provider   traMADol (ULTRAM) 50 MG tablet Take 1 tablet (50 mg total) by mouth every 6 (six) hours as needed for severe pain. 15 tablet Enrique Sack, FNP   cyclobenzaprine (FLEXERIL) 10 MG tablet Take 1 tablet (10 mg total) by mouth 2 (two) times daily as needed for muscle spasms.  20 tablet Enrique Sack, FNP   methylPREDNISolone (MEDROL DOSEPAK) 4 MG TBPK tablet Take as directed 21 tablet Enrique Sack, FNP   diclofenac (VOLTAREN) 50 MG EC tablet Take 1 tablet (50 mg total) by mouth 3  (three) times daily as needed (PAIN). 21 tablet Enrique Sack, FNP     Controlled Substance Prescriptions [place] Controlled Substance Registry consulted? Yes, I have consulted the [place] Controlled Substances Registry for this patient, and feel the risk/benefit ratio today is favorable for proceeding with this prescription for a controlled substance.   Enrique Sack, [place] 12/23/17 1030

## 2017-12-23 NOTE — Discharge Instructions (Signed)
Take medications as prescribed. Monitor your sugars closely while on the steroids. Cover with insulin as discussed. If your sugars began to run too high, you may stop the steroids as discussed. Alternate between ice and heat. Follow up with orthopedics if no change in symptoms.

## 2017-12-23 NOTE — ED Triage Notes (Signed)
Pt c/o neck pain after taking a nap on Sunday, progressively became worse.

## 2018-01-21 ENCOUNTER — Ambulatory Visit (INDEPENDENT_AMBULATORY_CARE_PROVIDER_SITE_OTHER): Payer: 59 | Admitting: Internal Medicine

## 2018-01-21 ENCOUNTER — Encounter: Payer: Self-pay | Admitting: Internal Medicine

## 2018-01-21 VITALS — BP 116/78 | HR 92 | Temp 98.1°F | Ht 64.0 in | Wt 263.0 lb

## 2018-01-21 DIAGNOSIS — J4551 Severe persistent asthma with (acute) exacerbation: Secondary | ICD-10-CM | POA: Diagnosis not present

## 2018-01-21 DIAGNOSIS — J069 Acute upper respiratory infection, unspecified: Secondary | ICD-10-CM | POA: Diagnosis not present

## 2018-01-21 DIAGNOSIS — I10 Essential (primary) hypertension: Secondary | ICD-10-CM

## 2018-01-21 MED ORDER — AZITHROMYCIN 250 MG PO TABS: ORAL_TABLET | ORAL | 1 refills | Status: DC

## 2018-01-21 MED ORDER — HYDROCODONE-HOMATROPINE 5-1.5 MG/5ML PO SYRP: 5.0000 mL | ORAL_SOLUTION | Freq: Four times a day (QID) | ORAL | 0 refills | Status: AC | PRN

## 2018-01-21 NOTE — Patient Instructions (Signed)
Please take all new medication as prescribed - the antibiotic, and cough medicine as needed  Please continue all other medications as before, and refills have been done if requested.  Please have the pharmacy call with any other refills you may need.  Please keep your appointments with your specialists as you may have planned   

## 2018-01-21 NOTE — Assessment & Plan Note (Signed)
stable overall by history and exam, recent data reviewed with pt, and pt to continue medical treatment as before,  to f/u any worsening symptoms or concerns  

## 2018-01-21 NOTE — Assessment & Plan Note (Signed)
Mild to mod, for antibx course,  to f/u any worsening symptoms or concerns 

## 2018-01-21 NOTE — Progress Notes (Signed)
Subjective:    Patient ID: Brenda Davis, female    DOB: 1965/11/25, 53 y.o.   MRN: [id number]  HPI   Here with 2-3 days acute onset fever, facial pain, severe ST,  Neck pressure, headache, general weakness and malaise, and greenish d/c, with mild scant prod cough, but pt denies chest pain, wheezing, increased sob or doe, orthopnea, PND, increased LE swelling, palpitations, dizziness or syncope.  Pt denies new neurological symptoms such as new headache, or facial or extremity weakness or numbness   Pt denies polydipsia, polyuria  No other new complaints  Did go to HD this AM Past Medical History:  Diagnosis Date  . Anemia   . Chronic kidney disease    STAGE 3 CHRONIC KIDNEY DISEASE SECONDARY TO DIABETIC GLOMERULOSCLEROSIS AND UNCONTROLLED HYPERTENSION - PER OFFICE NOTES DR. Florene Glen -[facility].  Marland Kitchen ESRD on dialysis [facility])    "MWF; [address] (05/06/2016)  . Gout   . HCAP (healthcare-associated pneumonia) 05/06/2016  . History of blood transfusion    "related to surgery"  . Hyperlipidemia   . Hypertension   . Morbid obesity ([place])   . Pain    LEFT SHOULDER PAIN - WAS SEEN AT AN URGENT CARE - GIVEN SLING FOR COMFORT AND TOLD ROM AS TOLERATED.  Marland Kitchen Palpitations 09/24/2016  . Type II diabetes mellitus ([place])    "gastric sleeve OR corrected this" (05/06/2016)   Past Surgical History:  Procedure Laterality Date  . AV FISTULA PLACEMENT Left 01/03/2015   Procedure: BRACHIAL CEPHALIC ARTERIOVENOUS  FISTULA CREATION LEFT ARM;  Surgeon: Angelia Mould, MD;  Location: [facility];  Service: Vascular;  Laterality: Left;  . CARPAL TUNNEL RELEASE Right   . [place]  . CHOLECYSTECTOMY  09/14/2017  . ESOPHAGOGASTRODUODENOSCOPY N/A 09/04/2012   Procedure: ESOPHAGOGASTRODUODENOSCOPY (EGD);  Surgeon: Shann Medal, MD;  [facility];  Service: General;  Laterality: N/A;  PF  . ESOPHAGOGASTRODUODENOSCOPY (EGD) WITH ESOPHAGEAL DILATION N/A 09/29/2012   Procedure: ESOPHAGOGASTRODUODENOSCOPY (EGD) WITH ESOPHAGEAL DILATION;  Surgeon: Milus Banister, MD;  [facility];  Service: Endoscopy;  Laterality: N/A;  . INSERTION OF DIALYSIS CATHETER N/A 01/03/2015   Procedure: INSERTION OF DIALYSIS CATHETER RIGHT INTERNAL JUGULAR;  Surgeon: Angelia Mould, MD;  Location: [facility];  Service: Vascular;  Laterality: N/A;  . LAPAROSCOPIC GASTRIC SLEEVE RESECTION N/A 07/19/2012   Procedure: LAPAROSCOPIC SLEEVE GASTRECTOMY with EGD;  Surgeon: Madilyn Hook, DO;  [facility];  Service: General;  Laterality: N/A;  laparoscopic sleeve gastrectomy with EGD  . PERIPHERAL VASCULAR CATHETERIZATION Left 05/23/2015   Procedure: Nolon Stalls;  Surgeon: Conrad Whiting, MD;  Location: [facility];  Service: Cardiovascular;  Laterality: Left;  upper aRM  . PERIPHERAL VASCULAR CATHETERIZATION Left 05/23/2015   Procedure: Peripheral Vascular Balloon Angioplasty;  Surgeon: Conrad [place], MD;  Location: [facility];  Service: Cardiovascular;  Laterality: Left;  av fistula  . TUBAL LIGATION  1993  . UPPER GI ENDOSCOPY N/A 07/19/2012   Procedure: UPPER GI ENDOSCOPY;  Surgeon: Madilyn Hook, DO;  [facility];  Service: General;  Laterality: N/A;    reports that she quit smoking about 11 years ago. Her smoking use included cigarettes. She has a 16.00 pack-year smoking history. She has never used smokeless tobacco. She reports that she does not drink alcohol or use drugs. family history includes Arthritis in an other family member; Diabetes in her father; Hyperlipidemia in an other family member; Hypertension in her mother;  Mitral valve prolapse in her mother. Allergies  Allergen Reactions  . Amlodipine Swelling    edema  . Clonidine Derivatives Swelling    Limbs swell  . Welchol [Colesevelam Hcl] Nausea Only   Current Outpatient Medications on File Prior to Visit  Medication Sig Dispense Refill  . AURYXIA 1 GM 210 MG(Fe) tablet Take 210 mg by mouth 3  (three) times daily with meals.   3  . calcium acetate (PHOSLO) 667 MG capsule Take 2 capsules (1,334 mg total) by mouth 3 (three) times daily with meals. 180 capsule 0  . carvedilol (COREG) 6.25 MG tablet Take 1 tablet (6.25 mg total) by mouth 2 (two) times daily with a meal. 60 tablet 1  . Cyanocobalamin (B-12 PO) Take 1 tablet by mouth daily.    . cyclobenzaprine (FLEXERIL) 10 MG tablet Take 1 tablet (10 mg total) by mouth 2 (two) times daily as needed for muscle spasms. 20 tablet 0  . diclofenac (VOLTAREN) 50 MG EC tablet Take 1 tablet (50 mg total) by mouth 3 (three) times daily as needed (PAIN). 21 tablet 0  . folic acid (FOLVITE) 1 MG tablet Take 1 tablet (1 mg total) by mouth daily. 90 tablet 1  . gentamicin cream (GARAMYCIN) 0.1 % APPLY TO EXIT SITE DAILY AS DIRECTED  4  . insulin aspart (NOVOLOG) 100 UNIT/ML FlexPen Inject 10 Units into the skin 3 (three) times daily with meals. 15 mL 5  . Insulin Glargine (BASAGLAR KWIKPEN) 100 UNIT/ML SOPN Inject 0.5 mLs (50 Units total) into the skin daily. 9 mL 1  . Insulin Pen Needle (NOVOFINE) 32G X 6 MM MISC 1 Act by Does not apply route 4 (four) times daily -  with meals and at bedtime. 300 each 1  . methylPREDNISolone (MEDROL DOSEPAK) 4 MG TBPK tablet Take as directed 21 tablet 0  . Multiple Vitamins-Minerals (PRORENAL + D) TABS Take 1 tablet by mouth daily.  2  . multivitamin (RENA-VIT) TABS tablet Take 1 tablet by mouth at bedtime. 30 tablet 0  . ondansetron (ZOFRAN ODT) 4 MG disintegrating tablet Take 1 tablet (4 mg total) by mouth every 6 (six) hours as needed for nausea or vomiting. 30 tablet 1  . polyethylene glycol (MIRALAX / GLYCOLAX) packet Take 17 g by mouth daily as needed. 14 each 0  . thiamine (VITAMIN B-1) 100 MG tablet Take 1 tablet (100 mg total) by mouth daily. 90 tablet 1  . traMADol (ULTRAM) 50 MG tablet Take 1 tablet (50 mg total) by mouth every 6 (six) hours as needed for severe pain. 15 tablet 0   No current  facility-administered medications on file prior to visit.    Review of Systems  Constitutional: Negative for other unusual diaphoresis or sweats HENT: Negative for ear discharge or swelling Eyes: Negative for other worsening visual disturbances Respiratory: Negative for stridor or other swelling  Gastrointestinal: Negative for worsening distension or other blood Genitourinary: Negative for retention or other urinary change Musculoskeletal: Negative for other MSK pain or swelling Skin: Negative for color change or other new lesions Neurological: Negative for worsening tremors and other numbness  Psychiatric/Behavioral: Negative for worsening agitation or other fatigue All other system neg per pt    Objective:   Physical Exam BP 116/78   Pulse 92   Temp 98.1 F (36.7 C) (Oral)   Ht 5\' 4"  (1.626 m)   Wt 263 lb (119.3 kg)   SpO2 99%   BMI 45.14 kg/m  VS noted, mild ill,  hoarse to speak Constitutional: Pt appears in NAD HENT: Head: NCAT.  Right Ear: External ear normal.  Left Ear: External ear normal.  Eyes: . Pupils are equal, round, and reactive to light. Conjunctivae and EOM are normal Bilat tm's with mild erythema.  Max sinus areas mild tender.  Pharynx with severe erythema, no exudate Nose: without d/c or deformity but marked bilat submandibular tender LA Neck: Neck supple. Gross normal ROM Cardiovascular: Normal rate and regular rhythm.   Pulmonary/Chest: Effort normal and breath sounds without rales or wheezing.  Neurological: Pt is alert. At baseline orientation, motor grossly intact Skin: Skin is warm. No rashes, other new lesions, no LE edema Psychiatric: Pt behavior is normal without agitation  No other exam findings Lab Results  Component Value Date   WBC 8.8 10/28/2017   HGB 11.1 (L) 10/28/2017   HCT 34.4 (L) 10/28/2017   PLT 321.0 10/28/2017   GLUCOSE 389 (H) 10/28/2017   CHOL 182 08/25/2017   TRIG 118 08/25/2017   HDL 32 (L) 08/25/2017   LDLDIRECT 161.6  11/28/2012   LDLCALC 126 (H) 08/25/2017   ALT 16 10/28/2017   AST 13 10/28/2017   NA 135 10/28/2017   K 3.5 10/28/2017   CL 94 (L) 10/28/2017   CREATININE 11.95 (HH) 10/28/2017   BUN 46 (H) 10/28/2017   CO2 27 10/28/2017   TSH 2.020 09/24/2016   INR 1.0 11/26/2017   HGBA1C 10.4 (A) 11/22/2017   MICROALBUR 159.00 (H) 09/17/2009       Assessment & Plan:

## 2018-02-01 ENCOUNTER — Ambulatory Visit: Payer: Self-pay

## 2018-02-01 NOTE — Telephone Encounter (Signed)
Pt called stating that her BP is low and she feels weak and tired. Her BP today 88/42.  HR 97. She states that she knows she is malnourished.  She has not been able to keep food down and has had ongoing treatment with GI.  She states she is able to drink liquid and keep them down.  She is SOB while speaking.  She is at work.  She is a dialysis pt.  She did her home dialysis yesterday and took 1500 mls off. She states that she is on BP medication but has not taken them for several weeks because her BP has been low. Per protocol pt will go to the ED for evaluation. Care advice read to patient. Pt verbalized understanding of all instructions.  Reason for Disposition . [6] Fall in systolic BP > 20 mm Hg from normal AND [2] dizzy, lightheaded, or weak  Answer Assessment - Initial Assessment Questions 1. BLOOD PRESSURE: "What is the blood pressure?" "Did you take at least two measurements 5 minutes apart?"     This morning 88/42 2. ONSET: "When did you take your blood pressure?"     This Am 3. HOW: "How did you obtain the blood pressure?" (e.g., visiting nurse, automatic home BP monitor)     home 4. HISTORY: "Do you have a history of low blood pressure?" "What is your blood pressure normally?"     yes 5. MEDICATIONS: "Are you taking any medications for blood pressure?" If yes: "Have they been changed recently?"     Have not taken for at least 3 weeks 6. PULSE RATE: "Do you know what your pulse rate is?"      97 7. OTHER SYMPTOMS: "Have you been sick recently?" "Have you had a recent injury?"     Vomiting can't keep anything down that is solid Dialysis 1500 8. PREGNANCY: "Is there any chance you are pregnant?" "When was your last menstrual period?"     N/a NOT HAVING PERIODS  Protocols used: LOW BLOOD PRESSURE-A-AH

## 2018-02-03 ENCOUNTER — Emergency Department (HOSPITAL_COMMUNITY)
Admission: EM | Admit: 2018-02-03 | Discharge: 2018-02-03 | Disposition: A | Discharge status: Home / Self Care | Payer: 59 | Attending: Emergency Medicine | Admitting: Emergency Medicine

## 2018-02-03 ENCOUNTER — Other Ambulatory Visit: Payer: Self-pay

## 2018-02-03 DIAGNOSIS — E860 Dehydration: Secondary | ICD-10-CM | POA: Diagnosis not present

## 2018-02-03 DIAGNOSIS — I509 Heart failure, unspecified: Secondary | ICD-10-CM | POA: Insufficient documentation

## 2018-02-03 DIAGNOSIS — I132 Hypertensive heart and chronic kidney disease with heart failure and with stage 5 chronic kidney disease, or end stage renal disease: Secondary | ICD-10-CM | POA: Diagnosis not present

## 2018-02-03 DIAGNOSIS — N186 End stage renal disease: Secondary | ICD-10-CM | POA: Diagnosis not present

## 2018-02-03 DIAGNOSIS — Z79899 Other long term (current) drug therapy: Secondary | ICD-10-CM | POA: Insufficient documentation

## 2018-02-03 DIAGNOSIS — Z992 Dependence on renal dialysis: Secondary | ICD-10-CM | POA: Diagnosis not present

## 2018-02-03 DIAGNOSIS — I951 Orthostatic hypotension: Secondary | ICD-10-CM

## 2018-02-03 DIAGNOSIS — R42 Dizziness and giddiness: Secondary | ICD-10-CM | POA: Diagnosis present

## 2018-02-03 DIAGNOSIS — Z794 Long term (current) use of insulin: Secondary | ICD-10-CM | POA: Diagnosis not present

## 2018-02-03 DIAGNOSIS — Z87891 Personal history of nicotine dependence: Secondary | ICD-10-CM | POA: Insufficient documentation

## 2018-02-03 LAB — CBC WITH DIFFERENTIAL/PLATELET
Abs Immature Granulocytes: 0.05 10*3/uL (ref 0.00–0.07)
Basophils Absolute: 0 10*3/uL (ref 0.0–0.1)
Basophils Relative: 1 %
Eosinophils Absolute: 0.3 10*3/uL (ref 0.0–0.5)
Eosinophils Relative: 3 %
HCT: 40.3 % (ref 36.0–46.0)
Hemoglobin: 12.5 g/dL (ref 12.0–15.0)
Immature Granulocytes: 1 %
Lymphocytes Relative: 19 %
Lymphs Abs: 1.6 10*3/uL (ref 0.7–4.0)
MCH: 31.7 pg (ref 26.0–34.0)
MCHC: 31 g/dL (ref 30.0–36.0)
MCV: 102.3 fL — ABNORMAL HIGH (ref 80.0–100.0)
Monocytes Absolute: 0.9 10*3/uL (ref 0.1–1.0)
Monocytes Relative: 10 %
Neutro Abs: 5.7 10*3/uL (ref 1.7–7.7)
Neutrophils Relative %: 66 %
Platelets: 371 10*3/uL (ref 150–400)
RBC: 3.94 MIL/uL (ref 3.87–5.11)
RDW: 15.9 % — ABNORMAL HIGH (ref 11.5–15.5)
WBC: 8.6 10*3/uL (ref 4.0–10.5)
nRBC: 0.3 % — ABNORMAL HIGH (ref 0.0–0.2)

## 2018-02-03 LAB — BASIC METABOLIC PANEL
Anion gap: 20 — ABNORMAL HIGH (ref 5–15)
BUN: 42 mg/dL — ABNORMAL HIGH (ref 6–20)
CO2: 22 mmol/L (ref 22–32)
Calcium: 9.1 mg/dL (ref 8.9–10.3)
Chloride: 95 mmol/L — ABNORMAL LOW (ref 98–111)
Creatinine, Ser: 14.3 mg/dL — ABNORMAL HIGH (ref 0.44–1.00)
GFR calc Af Amer: 3 mL/min — ABNORMAL LOW (ref >60)
GFR calc non Af Amer: 3 mL/min — ABNORMAL LOW (ref >60)
Glucose, Bld: 199 mg/dL — ABNORMAL HIGH (ref 70–99)
Potassium: 3.9 mmol/L (ref 3.5–5.1)
Sodium: 137 mmol/L (ref 135–145)

## 2018-02-03 MED ORDER — SODIUM CHLORIDE 0.9 % IV BOLUS
500.0000 mL | Freq: Once | INTRAVENOUS | Status: AC
  Administered 2018-02-03: 500 mL via INTRAVENOUS

## 2018-02-03 NOTE — Discharge Instructions (Addendum)
You have been diagnosed today with orthostatic hypotension and dehydration.  At this time there does not appear to be the presence of an emergent medical condition, however there is always the potential for conditions to change. Please read and follow the below instructions.  Please return to the Emergency Department immediately for any new or worsening symptoms. Please be sure to follow up with your Primary Care Provider this week regarding your visit today; please call their office to schedule an appointment even if you are feeling better for a follow-up visit. It is possible that your dizziness and low blood pressure in the mornings is due to your dialysis.  Please discuss this with your nephrologist as well as your primary care provider this week.  Additionally please follow back up with your gastroenterologist for your loose bowel movements.  Get help right away if you: Have chest pain. Have a fast or irregular heartbeat. Develop numbness in any part of your body. Cannot move your arms or your legs. Have trouble speaking. Become sweaty or feel light-headed. Faint. Feel short of breath. Have trouble staying awake. Feel confused. You have fever  Please read the additional information packets attached to your discharge summary.  Do not take your medicine if  develop an itchy rash, swelling in your mouth or lips, or difficulty breathing.

## 2018-02-03 NOTE — ED Provider Notes (Signed)
[facility] EMERGENCY DEPARTMENT Provider Note   CSN: [id number] Arrival date & time: 02/03/18  0813   History   Chief Complaint Chief Complaint  Patient presents with  . Dizziness    HPI Brenda Davis is a 53 y.o. female presenting today for hypotension and dizziness upon standing.  Patient with history of end-stage renal disease and currently on home peritoneal dialysis every night.  Patient states that she monitors her blood pressure multiple times daily due to peritoneal dialysis.  Patient states that over the past 3 weeks she has noticed that her blood pressure is low in the morning and that she has had increasing lightheadedness and a racing heart upon standing for the past few weeks.  Patient states that just prior to the onset of her lightheadedness, hypotension and racing heart in the morning she was increased by her nephrologist from 5 cycles to 6 cycles a night on her peritoneal dialysis.  Patient states that in August that she had a cholecystectomy performed and has had loose stools since that time, as well as intermittent vomiting without nausea a few times per .  Patient denies recent illness, fever, cough, abdominal pain, chest pain/shortness of breath, visual changes, numbness/weakness or tingling of the extremities or any other concerns.  Patient states that she has been seen by a gastroenterologist regarding her intermittent nausea and daily loose stools and that she has followed diet changes to help with this.  Additionally patient was seen by her nephrologist on 01/31/2018, patient states that she brought up her concern of hypotension and lightheadedness upon standing with her nephrologist however they asked her to follow-up with her primary care provider.  Patient's primary care provider requested that the patient come to the emergency department for evaluation.  Upon my initial evaluation patient is resting comfortably in bed and in no acute  distress.  She denies any and all pain at this time.  Patient had orthostatic vital signs performed by triage staff, there has been a decrease in systolic blood pressure of 31 from time of laying to standing for 3 minutes.  Patient denies chest pain or shortness of breath during that time.  HPI  Past Medical History:  Diagnosis Date  . Anemia   . Chronic kidney disease    STAGE 3 CHRONIC KIDNEY DISEASE SECONDARY TO DIABETIC GLOMERULOSCLEROSIS AND UNCONTROLLED HYPERTENSION - PER OFFICE NOTES DR. Florene [facility].  Marland Kitchen ESRD on dialysis [facility])    "MWF; [address] (05/06/2016)  . Gout   . HCAP (healthcare-associated pneumonia) 05/06/2016  . History of blood transfusion    "related to surgery"  . Hyperlipidemia   . Hypertension   . Morbid obesity ([place])   . Pain    LEFT SHOULDER PAIN - WAS SEEN AT AN URGENT CARE - GIVEN SLING FOR COMFORT AND TOLD ROM AS TOLERATED.  Marland Kitchen Palpitations 09/24/2016  . Type II diabetes mellitus ([place])    "gastric sleeve OR corrected this" (05/06/2016)    Patient Active Problem List   Diagnosis Date Noted  . Acute upper respiratory infection 01/21/2018  . Hepatic cirrhosis ([place]) 11/24/2017  . Omental infarction ([place]) 08/25/2017  . ESRD on peritoneal dialysis ([place]) 08/25/2017  . DM (diabetes mellitus), type 2 with renal complications ([place]) 99/37/1696  . Deficiency anemia 08/24/2017  . Elevated lipase 08/24/2017  . Chronic heart failure with preserved ejection fraction ([place]) 06/22/2017  . Hypertension 09/15/2016  . Sensorineural hearing loss (SNHL), bilateral 06/29/2016  . Allergic rhinitis 06/27/2016  .  Severe persistent asthma with exacerbation 05/12/2016  . Anemia due to chronic kidney disease 05/06/2016  . Routine general medical examination at a health care facility 12/05/2015  . Cervical cancer screening 12/05/2015  . Visit for screening mammogram 01/23/2015  . Sinus pause 01/04/2015  . Gout due to renal impairment 09/13/2014  . Thiamine  deficiency 12/02/2012  . Meralgia paraesthetica 11/28/2012  . S/P laparoscopic sleeve gastrectomy 11/28/2012  . B12 deficiency anemia 11/28/2012  . Hyperlipidemia with target LDL less than 100 04/09/2008  . CKD (chronic kidney disease) stage V requiring chronic dialysis (MWF) 02/08/2008  . HYPERTENSION, BENIGN ESSENTIAL 01/20/1999    Past Surgical History:  Procedure Laterality Date  . AV FISTULA PLACEMENT Left 01/03/2015   Procedure: BRACHIAL CEPHALIC ARTERIOVENOUS  FISTULA CREATION LEFT ARM;  Surgeon: Angelia Mould, MD;  Location: [facility];  Service: Vascular;  Laterality: Left;  . CARPAL TUNNEL RELEASE Right   . [place]  . CHOLECYSTECTOMY  09/14/2017  . ESOPHAGOGASTRODUODENOSCOPY N/A 09/04/2012   Procedure: ESOPHAGOGASTRODUODENOSCOPY (EGD);  Surgeon: Shann Medal, MD;  Location: [facility];  Service: General;  Laterality: N/A;  PF  . ESOPHAGOGASTRODUODENOSCOPY (EGD) WITH ESOPHAGEAL DILATION N/A 09/29/2012   Procedure: ESOPHAGOGASTRODUODENOSCOPY (EGD) WITH ESOPHAGEAL DILATION;  Surgeon: Milus Banister, MD;  Location: [facility];  Service: Endoscopy;  Laterality: N/A;  . INSERTION OF DIALYSIS CATHETER N/A 01/03/2015   Procedure: INSERTION OF DIALYSIS CATHETER RIGHT INTERNAL JUGULAR;  Surgeon: Angelia Mould, MD;  Location: [facility];  Service: Vascular;  Laterality: N/A;  . LAPAROSCOPIC GASTRIC SLEEVE RESECTION N/A 07/19/2012   Procedure: LAPAROSCOPIC SLEEVE GASTRECTOMY with EGD;  Surgeon: Madilyn Hook, DO;  Location: [facility];  Service: General;  Laterality: N/A;  laparoscopic sleeve gastrectomy with EGD  . PERIPHERAL VASCULAR CATHETERIZATION Left 05/23/2015   Procedure: Nolon Stalls;  Surgeon: Conrad Kirbyville, MD;  Location: [facility];  Service: Cardiovascular;  Laterality: Left;  upper aRM  . PERIPHERAL VASCULAR CATHETERIZATION Left 05/23/2015   Procedure: Peripheral Vascular Balloon Angioplasty;  Surgeon: Conrad De Smet, MD;  Location: [facility];   Service: Cardiovascular;  Laterality: Left;  av fistula  . TUBAL LIGATION  1993  . UPPER GI ENDOSCOPY N/A 07/19/2012   Procedure: UPPER GI ENDOSCOPY;  Surgeon: Madilyn Hook, DO;  Location: [facility];  Service: General;  Laterality: N/A;     OB History   No obstetric history on file.      Home Medications    Prior to Admission medications   Medication Sig Start Date End Date Taking? Authorizing Provider  acetaminophen (TYLENOL) 325 MG tablet Take 650 mg by mouth every 6 (six) hours as needed for mild pain.   Yes [provider]  AURYXIA 1 GM 210 MG(Fe) tablet Take 210 mg by mouth 3 (three) times daily with meals.  04/07/17  Yes [provider]  calcitRIOL (ROCALTROL) 0.5 MCG capsule Take 0.5 mcg by mouth daily. 12/24/17  Yes [provider]  calcium acetate (PHOSLO) 667 MG capsule Take 2 capsules (1,334 mg total) by mouth 3 (three) times daily with meals. 01/07/15  Yes Eugenie Filler, MD  carvedilol (COREG) 6.25 MG tablet Take 1 tablet (6.25 mg total) by mouth 2 (two) times daily with a meal. 10/15/17  Yes Marrian Salvage, FNP  Cyanocobalamin (B-12 PO) Take 1 tablet by mouth daily.   Yes [provider]  gentamicin cream (GARAMYCIN) 0.1 % Apply 1 application topically See admin instructions. Apply to exit site daily  as directed. 04/26/17  Yes [provider]  insulin aspart (NOVOLOG) 100 UNIT/ML FlexPen Inject 10 Units into the skin 3 (three) times daily with meals. 12/01/17  Yes [name], MD  Insulin Glargine (BASAGLAR KWIKPEN) 100 UNIT/ML SOPN Inject 0.5 mLs (50 Units total) into the skin daily. 12/01/17  Yes [name], MD  Multiple Vitamins-Minerals (PRORENAL + D) TABS Take 1 tablet by mouth daily. 10/29/17  Yes [provider]  multivitamin (RENA-VIT) TABS tablet Take 1 tablet by mouth at bedtime. 01/07/15  Yes Eugenie Filler, MD  thiamine (VITAMIN B-1) 100 MG tablet Take 1 tablet (100 mg total) by mouth daily.  06/21/17  Yes [name], MD  cyclobenzaprine (FLEXERIL) 10 MG tablet Take 1 tablet (10 mg total) by mouth 2 (two) times daily as needed for muscle spasms. Patient not taking: Reported on 02/03/2018 12/23/17   Enrique Sack, FNP  diclofenac (VOLTAREN) 50 MG EC tablet Take 1 tablet (50 mg total) by mouth 3 (three) times daily as needed (PAIN). Patient not taking: Reported on 02/03/2018 12/23/17   Enrique Sack, FNP  folic acid (FOLVITE) 1 MG tablet Take 1 tablet (1 mg total) by mouth daily. Patient not taking: Reported on 02/03/2018 06/21/17   [name], MD  Insulin Pen Needle (NOVOFINE) 32G X 6 MM MISC 1 Act by Does not apply route 4 (four) times daily -  with meals and at bedtime. 11/22/17   [name], MD  ondansetron (ZOFRAN ODT) 4 MG disintegrating tablet Take 1 tablet (4 mg total) by mouth every 6 (six) hours as needed for nausea or vomiting. Patient not taking: Reported on 02/03/2018 11/04/17   Zehr, Janett Billow D, PA-C  polyethylene glycol (MIRALAX / GLYCOLAX) packet Take 17 g by mouth daily as needed. Patient not taking: Reported on 02/03/2018 05/09/16   Eugenie Filler, MD  traMADol (ULTRAM) 50 MG tablet Take 1 tablet (50 mg total) by mouth every 6 (six) hours as needed for severe pain. Patient not taking: Reported on 02/03/2018 12/23/17   Enrique Sack, FNP    Family History Family History  Problem Relation Age of Onset  . Hypertension Mother   . Mitral valve prolapse Mother   . Diabetes Father   . Arthritis Other   . Hyperlipidemia Other   . Cancer Neg Hx   . Heart disease Neg Hx   . Kidney disease Neg Hx   . Stroke Neg Hx     Social History Social History   Tobacco Use  . Smoking status: Former Smoker    Packs/day: 1.00    Years: 16.00    Pack years: 16.00    Types: Cigarettes    Last attempt to quit: 2009    Years since quitting: 11.0  . Smokeless tobacco: Never Used  Substance Use Topics  . Alcohol use: Never    Frequency: Never  . Drug use: No       Allergies   Amlodipine; Clonidine derivatives; and Welchol [colesevelam hcl]   Review of Systems Review of Systems  Constitutional: Negative.  Negative for chills and fever.  Eyes: Negative.  Negative for visual disturbance.  Respiratory: Negative.  Negative for cough and shortness of breath.   Cardiovascular: Negative.  Negative for chest pain and leg swelling.  Gastrointestinal: Positive for diarrhea (Loose stools) and vomiting (Intermittent). Negative for abdominal pain and nausea.  Musculoskeletal: Negative.  Negative for arthralgias and myalgias.  Neurological: Positive for dizziness. Negative for syncope, speech difficulty, weakness,  numbness and headaches.  All other systems reviewed and are negative.   Physical Exam Updated Vital Signs BP 103/72   Pulse 76   Resp (!) 25   Ht 5\' 4"  (1.626 m)   Wt 119.3 kg   SpO2 97%   BMI 45.14 kg/m   Physical Exam Constitutional:      General: She is not in acute distress.    Appearance: Normal appearance. She is well-developed. She is not ill-appearing or diaphoretic.  HENT:     Head: Normocephalic and atraumatic.     Right Ear: External ear normal.     Left Ear: External ear normal.     Nose: Nose normal.     Mouth/Throat:     Mouth: Mucous membranes are moist.     Pharynx: Oropharynx is clear.  Eyes:     Extraocular Movements: Extraocular movements intact.     Conjunctiva/sclera: Conjunctivae normal.     Pupils: Pupils are equal, round, and reactive to light.  Neck:     Musculoskeletal: Normal range of motion and neck supple.     Trachea: Trachea normal. No tracheal deviation.  Cardiovascular:     Rate and Rhythm: Normal rate and regular rhythm.     Pulses:          Radial pulses are 2+ on the right side and 2+ on the left side.       Dorsalis pedis pulses are 2+ on the right side and 2+ on the left side.       Posterior tibial pulses are 2+ on the right side and 2+ on the left side.     Heart sounds: Normal heart  sounds.  Pulmonary:     Effort: Pulmonary effort is normal. No respiratory distress.     Breath sounds: Normal breath sounds and air entry. No wheezing.  Chest:     Chest wall: No tenderness.  Abdominal:     General: Bowel sounds are normal.     Palpations: Abdomen is soft.     Tenderness: There is no abdominal tenderness. There is no guarding or rebound.  Musculoskeletal: Normal range of motion.     Right lower leg: Normal.     Left lower leg: Normal.  Feet:     Right foot:     Protective Sensation: 3 sites tested. 3 sites sensed.     Left foot:     Protective Sensation: 3 sites tested. 3 sites sensed.  Skin:    General: Skin is warm and dry.     Capillary Refill: Capillary refill takes less than 2 seconds.  Neurological:     General: No focal deficit present.     Mental Status: She is alert and oriented to person, place, and time.     GCS: GCS eye subscore is 4. GCS verbal subscore is 5. GCS motor subscore is 6.     Comments: Mental Status: Alert, oriented, thought content appropriate, able to give a coherent history. Speech fluent without evidence of aphasia. Able to follow 2 step commands without difficulty. Cranial Nerves: II: Peripheral visual fields grossly normal, pupils equal, round, reactive to light III,IV, VI: ptosis not present, extra-ocular motions intact bilaterally V,VII: smile symmetric, eyebrows raise symmetric, facial light touch sensation equal VIII: hearing grossly normal to voice X: uvula elevates symmetrically XI: bilateral shoulder shrug symmetric and strong XII: midline tongue extension without fassiculations Motor: Normal tone. 5/5 strength in upper and lower extremities bilaterally including strong and equal grip strength and dorsiflexion/plantar  flexion Sensory: Sensation intact to light touch in all extremities.Negative Romberg.  Cerebellar: normal finger-to-nose with bilateral upper extremities. Normal heel-to -shin balance bilaterally of  the lower extremity. No pronator drift.  Gait: normal gait and balance CV: distal pulses palpable throughout  Psychiatric:        Mood and Affect: Mood normal.        Behavior: Behavior normal.    ED Treatments / Results  Labs (all labs ordered are listed, but only abnormal results are displayed) Labs Reviewed  CBC WITH DIFFERENTIAL/PLATELET - Abnormal; Notable for the following components:      Result Value   MCV 102.3 (*)    RDW 15.9 (*)    nRBC 0.3 (*)    All other components within normal limits  BASIC METABOLIC PANEL - Abnormal; Notable for the following components:   Chloride 95 (*)    Glucose, Bld 199 (*)    BUN 42 (*)    Creatinine, Ser 14.30 (*)    GFR calc non Af Amer 3 (*)    GFR calc Af Amer 3 (*)    Anion gap 20 (*)    All other components within normal limits    EKG EKG Interpretation  Date/Time:  Thursday February 03 2018 08:20:35 EST Ventricular Rate:  88 PR Interval:    QRS Duration: 109 QT Interval:  406 QTC Calculation: 492 R Axis:   22 Text Interpretation:  Sinus rhythm Low voltage, precordial leads Consider anterior infarct Confirmed by Veryl Speak [phone]) on 02/03/2018 8:57:10 AM   Radiology No results found.  Procedures Procedures (including critical care time)  Medications Ordered in ED Medications  sodium chloride 0.9 % bolus 500 mL (0 mLs Intravenous Stopped 02/03/18 1053)     Initial Impression / Assessment and Plan / ED Course  I have reviewed the triage vital signs and the nursing notes.  Pertinent labs & imaging results that were available during my care of the patient were reviewed by me and considered in my medical decision making (see chart for details).    53 year old female on peritoneal dialysis presenting for hypotension, dizziness upon standing and racing heartbeat upon standing.  No chest pain or shortness of breath.  Positive orthostatics upon arrival.  Case discussed with Dr. Stark Jock, will obtain CBC, BMP and give fluid  bolus at this time. ----------- Patient given 500 mL fluid bolus here in emergency department.  Orthostatic vital signs were completed again by nursing staff.  Patient is no longer orthostatic, systolic remained greater than 100 and pulse remained between 77 and 93.  Patient states that she is feeling well and is no longer dizzy upon standing.  Patient states that she is ready to be discharged.  CBC nonacute, BMP appears baseline, EKG without acute changes reviewed by Dr. Stark Jock.  Suspect patient some morning hypotension, lightheadedness and racing heart rate secondary to her change in peritoneal dialysis over the past month, patient states that she has been having more fluid pulled off of her every night and she thinks this is why she is becoming dehydrated. Rediscussed case with Dr. Stark Jock who agrees with discharge and PCP/nephrology follow-up.  At this time there does not appear to be any evidence of an acute emergency medical condition and the patient appears stable for discharge with appropriate outpatient follow up. Diagnosis was discussed with patient who verbalizes understanding of care plan and is agreeable to discharge. I have discussed return precautions with patient and husband who verbalize understanding of return precautions.  Patient strongly encouraged to follow-up with their PCP and nephrologist this week. All questions answered.  Note: Portions of this report may have been transcribed using voice recognition software. Every effort was made to ensure accuracy; however, inadvertent computerized transcription errors may still be present. Final Clinical Impressions(s) / ED Diagnoses   Final diagnoses:  Orthostatic hypotension  Dehydration    ED Discharge Orders    None       Gari Crown 02/03/18 1730    Veryl Speak, MD 02/05/18 [phone]

## 2018-02-03 NOTE — ED Triage Notes (Addendum)
Pt reports that she has been monitoring her blood pressure at home and recently it has been decreasing when she stands up. Pt reports that she gets dizzy and can feel her heart rate increasing when she stands up. Pt reports that she has had diarrhea at home since she had her gallbladder removed in August. Pt does do peritoneal dialysis at home.

## 2018-02-18 ENCOUNTER — Ambulatory Visit: Payer: 59 | Admitting: Nurse Practitioner

## 2018-02-24 ENCOUNTER — Other Ambulatory Visit: Payer: Self-pay | Admitting: Internal Medicine

## 2018-02-24 ENCOUNTER — Observation Stay (HOSPITAL_COMMUNITY)
Admission: EM | Admit: 2018-02-24 | Discharge: 2018-02-26 | Disposition: A | Discharge status: Home / Self Care | Payer: 59 | Attending: Internal Medicine | Admitting: Internal Medicine

## 2018-02-24 ENCOUNTER — Emergency Department (HOSPITAL_COMMUNITY): Payer: 59

## 2018-02-24 ENCOUNTER — Other Ambulatory Visit: Payer: Self-pay

## 2018-02-24 ENCOUNTER — Encounter (HOSPITAL_COMMUNITY): Payer: Self-pay | Admitting: Emergency Medicine

## 2018-02-24 DIAGNOSIS — R0789 Other chest pain: Secondary | ICD-10-CM | POA: Diagnosis not present

## 2018-02-24 DIAGNOSIS — Z992 Dependence on renal dialysis: Secondary | ICD-10-CM

## 2018-02-24 DIAGNOSIS — I25119 Atherosclerotic heart disease of native coronary artery with unspecified angina pectoris: Secondary | ICD-10-CM | POA: Diagnosis not present

## 2018-02-24 DIAGNOSIS — R079 Chest pain, unspecified: Secondary | ICD-10-CM | POA: Diagnosis present

## 2018-02-24 DIAGNOSIS — D638 Anemia in other chronic diseases classified elsewhere: Secondary | ICD-10-CM | POA: Diagnosis present

## 2018-02-24 DIAGNOSIS — M109 Gout, unspecified: Secondary | ICD-10-CM | POA: Diagnosis not present

## 2018-02-24 DIAGNOSIS — E876 Hypokalemia: Secondary | ICD-10-CM | POA: Diagnosis not present

## 2018-02-24 DIAGNOSIS — N186 End stage renal disease: Secondary | ICD-10-CM | POA: Diagnosis not present

## 2018-02-24 DIAGNOSIS — E785 Hyperlipidemia, unspecified: Secondary | ICD-10-CM | POA: Diagnosis present

## 2018-02-24 DIAGNOSIS — I214 Non-ST elevation (NSTEMI) myocardial infarction: Principal | ICD-10-CM

## 2018-02-24 DIAGNOSIS — R7989 Other specified abnormal findings of blood chemistry: Secondary | ICD-10-CM | POA: Insufficient documentation

## 2018-02-24 DIAGNOSIS — Z888 Allergy status to other drugs, medicaments and biological substances status: Secondary | ICD-10-CM | POA: Insufficient documentation

## 2018-02-24 DIAGNOSIS — E1122 Type 2 diabetes mellitus with diabetic chronic kidney disease: Secondary | ICD-10-CM | POA: Insufficient documentation

## 2018-02-24 DIAGNOSIS — N2581 Secondary hyperparathyroidism of renal origin: Secondary | ICD-10-CM | POA: Diagnosis not present

## 2018-02-24 DIAGNOSIS — Z7982 Long term (current) use of aspirin: Secondary | ICD-10-CM | POA: Insufficient documentation

## 2018-02-24 DIAGNOSIS — Z6841 Body Mass Index (BMI) 40.0 and over, adult: Secondary | ICD-10-CM | POA: Insufficient documentation

## 2018-02-24 DIAGNOSIS — Z794 Long term (current) use of insulin: Secondary | ICD-10-CM | POA: Diagnosis not present

## 2018-02-24 DIAGNOSIS — I120 Hypertensive chronic kidney disease with stage 5 chronic kidney disease or end stage renal disease: Secondary | ICD-10-CM | POA: Insufficient documentation

## 2018-02-24 DIAGNOSIS — Z79899 Other long term (current) drug therapy: Secondary | ICD-10-CM | POA: Insufficient documentation

## 2018-02-24 DIAGNOSIS — I2510 Atherosclerotic heart disease of native coronary artery without angina pectoris: Secondary | ICD-10-CM

## 2018-02-24 DIAGNOSIS — I10 Essential (primary) hypertension: Secondary | ICD-10-CM | POA: Diagnosis not present

## 2018-02-24 DIAGNOSIS — D631 Anemia in chronic kidney disease: Secondary | ICD-10-CM | POA: Insufficient documentation

## 2018-02-24 LAB — CBC
HCT: 39.5 % (ref 36.0–46.0)
Hemoglobin: 12.4 g/dL (ref 12.0–15.0)
MCH: 32.2 pg (ref 26.0–34.0)
MCHC: 31.4 g/dL (ref 30.0–36.0)
MCV: 102.6 fL — ABNORMAL HIGH (ref 80.0–100.0)
Platelets: 393 10*3/uL (ref 150–400)
RBC: 3.85 MIL/uL — ABNORMAL LOW (ref 3.87–5.11)
RDW: 15.1 % (ref 11.5–15.5)
WBC: 8.2 10*3/uL (ref 4.0–10.5)
nRBC: 0 % (ref 0.0–0.2)

## 2018-02-24 LAB — BASIC METABOLIC PANEL
Anion gap: 22 — ABNORMAL HIGH (ref 5–15)
BUN: 43 mg/dL — ABNORMAL HIGH (ref 6–20)
CO2: 23 mmol/L (ref 22–32)
Calcium: 9.1 mg/dL (ref 8.9–10.3)
Chloride: 95 mmol/L — ABNORMAL LOW (ref 98–111)
Creatinine, Ser: 12.67 mg/dL — ABNORMAL HIGH (ref 0.44–1.00)
GFR calc Af Amer: 3 mL/min — ABNORMAL LOW (ref >60)
GFR calc non Af Amer: 3 mL/min — ABNORMAL LOW (ref >60)
Glucose, Bld: 158 mg/dL — ABNORMAL HIGH (ref 70–99)
Potassium: 3.8 mmol/L (ref 3.5–5.1)
Sodium: 140 mmol/L (ref 135–145)

## 2018-02-24 LAB — I-STAT TROPONIN, ED
Troponin i, poc: 0.04 ng/mL (ref 0.00–0.08)
Troponin i, poc: 0.13 ng/mL (ref 0.00–0.08)

## 2018-02-24 LAB — TROPONIN I
Troponin I: 0.12 ng/mL (ref <0.03)
Troponin I: 0.13 ng/mL (ref <0.03)

## 2018-02-24 LAB — BRAIN NATRIURETIC PEPTIDE: B Natriuretic Peptide: 36.5 pg/mL (ref 0.0–100.0)

## 2018-02-24 LAB — GLUCOSE, CAPILLARY
Glucose-Capillary: 127 mg/dL — ABNORMAL HIGH (ref 70–99)
Glucose-Capillary: 160 mg/dL — ABNORMAL HIGH (ref 70–99)

## 2018-02-24 LAB — HEPARIN LEVEL (UNFRACTIONATED): Heparin Unfractionated: 0.39 IU/mL (ref 0.30–0.70)

## 2018-02-24 MED ORDER — ONDANSETRON HCL 4 MG/2ML IJ SOLN: 4.0000 mg | Freq: Four times a day (QID) | INTRAMUSCULAR | Status: DC | PRN

## 2018-02-24 MED ORDER — HEPARIN BOLUS VIA INFUSION
4000.0000 [IU] | Freq: Once | INTRAVENOUS | Status: AC
  Administered 2018-02-24: 4000 [IU] via INTRAVENOUS
  Filled 2018-02-24: qty 4000

## 2018-02-24 MED ORDER — ASPIRIN EC 325 MG PO TBEC
325.0000 mg | DELAYED_RELEASE_TABLET | Freq: Every day | ORAL | Status: DC
  Administered 2018-02-24 – 2018-02-25 (×2): 325 mg via ORAL
  Filled 2018-02-24 (×2): qty 1

## 2018-02-24 MED ORDER — CALCITRIOL 0.5 MCG PO CAPS
0.5000 ug | ORAL_CAPSULE | Freq: Every day | ORAL | Status: DC
  Administered 2018-02-24 – 2018-02-26 (×3): 0.5 ug via ORAL
  Filled 2018-02-24 (×3): qty 1

## 2018-02-24 MED ORDER — RENA-VITE PO TABS
1.0000 | ORAL_TABLET | Freq: Every day | ORAL | Status: DC
  Administered 2018-02-24: 1 via ORAL
  Filled 2018-02-24 (×3): qty 1

## 2018-02-24 MED ORDER — INSULIN ASPART 100 UNIT/ML ~~LOC~~ SOLN
0.0000 [IU] | Freq: Three times a day (TID) | SUBCUTANEOUS | Status: DC
  Administered 2018-02-24: 1 [IU] via SUBCUTANEOUS

## 2018-02-24 MED ORDER — CALCIUM ACETATE (PHOS BINDER) 667 MG PO CAPS
1334.0000 mg | ORAL_CAPSULE | Freq: Three times a day (TID) | ORAL | Status: DC
  Administered 2018-02-25 – 2018-02-26 (×3): 1334 mg via ORAL
  Filled 2018-02-24 (×4): qty 2

## 2018-02-24 MED ORDER — INSULIN GLARGINE 100 UNIT/ML ~~LOC~~ SOLN
50.0000 [IU] | Freq: Every day | SUBCUTANEOUS | Status: DC
  Administered 2018-02-25 – 2018-02-26 (×2): 50 [IU] via SUBCUTANEOUS
  Filled 2018-02-24 (×3): qty 0.5

## 2018-02-24 MED ORDER — CARVEDILOL 3.125 MG PO TABS
6.2500 mg | ORAL_TABLET | Freq: Two times a day (BID) | ORAL | Status: DC
  Administered 2018-02-25 – 2018-02-26 (×3): 6.25 mg via ORAL
  Filled 2018-02-24 (×2): qty 1
  Filled 2018-02-24 (×2): qty 2

## 2018-02-24 MED ORDER — FERRIC CITRATE 1 GM 210 MG(FE) PO TABS
420.0000 mg | ORAL_TABLET | Freq: Three times a day (TID) | ORAL | Status: DC
  Administered 2018-02-25 – 2018-02-26 (×3): 420 mg via ORAL
  Filled 2018-02-24 (×7): qty 2

## 2018-02-24 MED ORDER — ACETAMINOPHEN 325 MG PO TABS
650.0000 mg | ORAL_TABLET | Freq: Four times a day (QID) | ORAL | Status: DC | PRN
  Administered 2018-02-26: 06:00:00 650 mg via ORAL
  Filled 2018-02-24: qty 2

## 2018-02-24 MED ORDER — HEPARIN (PORCINE) 25000 UT/250ML-% IV SOLN
1200.0000 [IU]/h | INTRAVENOUS | Status: DC
  Administered 2018-02-24: 1050 [IU]/h via INTRAVENOUS
  Administered 2018-02-25: 1200 [IU]/h via INTRAVENOUS
  Filled 2018-02-24 (×2): qty 250

## 2018-02-24 MED ORDER — INSULIN ASPART 100 UNIT/ML ~~LOC~~ SOLN
10.0000 [IU] | Freq: Three times a day (TID) | SUBCUTANEOUS | Status: DC
  Administered 2018-02-24 – 2018-02-26 (×4): 10 [IU] via SUBCUTANEOUS

## 2018-02-24 MED ORDER — CINACALCET HCL 30 MG PO TABS
60.0000 mg | ORAL_TABLET | Freq: Every day | ORAL | Status: DC
  Administered 2018-02-24 – 2018-02-25 (×2): 60 mg via ORAL
  Filled 2018-02-24 (×2): qty 2

## 2018-02-24 MED ORDER — VITAMIN B-1 100 MG PO TABS
100.0000 mg | ORAL_TABLET | Freq: Every day | ORAL | Status: DC
  Administered 2018-02-24 – 2018-02-26 (×3): 100 mg via ORAL
  Filled 2018-02-24 (×3): qty 1

## 2018-02-24 MED ORDER — HEPARIN 1000 UNIT/ML FOR PERITONEAL DIALYSIS
INTRAPERITONEAL | Status: DC | PRN
  Filled 2018-02-24: qty 5000

## 2018-02-24 MED ORDER — MORPHINE SULFATE (PF) 2 MG/ML IV SOLN: 2.0000 mg | INTRAVENOUS | Status: DC | PRN

## 2018-02-24 MED ORDER — HEPARIN 1000 UNIT/ML FOR PERITONEAL DIALYSIS: 500.0000 [IU] | INTRAMUSCULAR | Status: DC | PRN

## 2018-02-24 MED ORDER — GENTAMICIN SULFATE 0.1 % EX CREA
1.0000 "application " | TOPICAL_CREAM | Freq: Every day | CUTANEOUS | Status: DC
  Administered 2018-02-24 – 2018-02-25 (×2): 1 via TOPICAL
  Filled 2018-02-24 (×2): qty 15

## 2018-02-24 MED ORDER — DELFLEX-LC/1.5% DEXTROSE 344 MOSM/L IP SOLN: INTRAPERITONEAL | Status: DC

## 2018-02-24 MED ORDER — PRORENAL + D PO TABS: 1.0000 | ORAL_TABLET | Freq: Every day | ORAL | Status: DC

## 2018-02-24 NOTE — Progress Notes (Addendum)
[facility] for heparin Indication: chest pain/ACS  Heparin Dosing Weight: 83.2 kg  Labs: Recent Labs    02/24/18 1027 02/24/18 1727 02/24/18 2256  HGB 12.4  --   --   HCT 39.5  --   --   PLT 393  --   --   HEPARINUNFRC  --   --  0.39  CREATININE 12.67*  --   --   TROPONINI  --  0.12* 0.13*    Assessment: 89 yof presenting with CP. Pharmacy consulted to dose heparin for ACS. Not on anticoagulation PTA. CBC wnl. No bleed issues documented. Noted ESRD on PD. Weight reported as 260 lbs in the ER per patient.   Heparin level therapeutic (0.39) on gtt at 1050 units/hr. No bleeding noted.  Goal of Therapy:  Heparin level 0.3-0.7 units/ml Monitor platelets by anticoagulation protocol: Yes   Plan:  Continue heparin at 1050 units/h Will f/u daily heparin level and CBC  Sherlon Handing, PharmD, BCPS Clinical pharmacist  **Pharmacist phone directory can now be found on [email or web address] ([email or web address]).  Listed under [place]. 02/24/2018 11:59 PM   2/7 0545 Heparin level down to slightly subtherapeutic (0.29) on gtt at 1050 units/hr. No issues with line or bleeding reported per RN.  Plan: Will increase gtt to 1200 units/hr and f/u 8 hour heparin level.  Sherlon Handing, PharmD, BCPS Clinical pharmacist  **Pharmacist phone directory can now be found on [email or web address] ([email or web address]).  Listed under [place]. 02/25/2018 5:47 AM

## 2018-02-24 NOTE — Consult Note (Signed)
Reason for Consult: To manage dialysis and dialysis related needs Referring Physician: [name] is an 53 y.o. female with past medical history significant for obesity, hypertension, hyperlipidemia, gout as well as ESRD-previously on hemodialysis but transitioned to peritoneal dialysis approximately 1-1/2 years ago.  She is followed by Dr. Marval Regal.  She is very knowledgeable regarding her treatment and is never had peritonitis.  She has no history of heart disease.  She reports when she completed her peritoneal dialysis early this morning she felt dizzy.  Blood pressure was in the 110's-she used all 2.5% PD fluid.  She says that she no longer uses any blood pressure medications.  In spite of not feeling great she still went on to work.  She later developed some substernal chest discomfort that did not abate with rest.  She eventually presented to the emergency department with this complaint.  Blood pressures have been from 10 4-1 17 systolically.  Heart rate 70-80 and is regular, oxygen saturation good on room air.  Her troponin appeared to be trending up-she was started on heparin and is being admitted for a rule out.  We are called to provide routine dialysis   Dialyzes at home-6 exchanges, volume 2500, dwell time 1-1/2 hours, 10-minute fill, 25-minute drain She says that she always uses 2.5%-but has noted blood pressure being low.  She is on calcitriol 0.5 mcg daily, Sensipar 60 mg daily, a mixture of PhosLo and Auryxia for phosphate binding 2 pills each  Past Medical History:  Diagnosis Date  . Anemia   . Chronic kidney disease    STAGE 3 CHRONIC KIDNEY DISEASE SECONDARY TO DIABETIC GLOMERULOSCLEROSIS AND UNCONTROLLED HYPERTENSION - PER OFFICE NOTES DR. [facility].  Marland Kitchen ESRD on dialysis [facility])    "MWF; [address] (05/06/2016)  . Gout   . HCAP (healthcare-associated pneumonia) 05/06/2016  . History of blood transfusion    "related to surgery"  .  Hyperlipidemia   . Hypertension   . Morbid obesity ([place])   . Pain    LEFT SHOULDER PAIN - WAS SEEN AT AN URGENT CARE - GIVEN SLING FOR COMFORT AND TOLD ROM AS TOLERATED.  Marland Kitchen Palpitations 09/24/2016  . Type II diabetes mellitus ([place])    "gastric sleeve OR corrected this" (05/06/2016)    Past Surgical History:  Procedure Laterality Date  . AV FISTULA PLACEMENT Left 01/03/2015   Procedure: BRACHIAL CEPHALIC ARTERIOVENOUS  FISTULA CREATION LEFT ARM;  Surgeon: Angelia Mould, MD;  Location: [place];  Service: Vascular;  Laterality: Left;  . CARPAL TUNNEL RELEASE Right   . [employer]  . CHOLECYSTECTOMY  09/14/2017  . ESOPHAGOGASTRODUODENOSCOPY N/A 09/04/2012   Procedure: ESOPHAGOGASTRODUODENOSCOPY (EGD);  Surgeon: Shann Medal, MD;  Location[facility];  Service: General;  Laterality: N/A;  PF  . ESOPHAGOGASTRODUODENOSCOPY (EGD) WITH ESOPHAGEAL DILATION N/A 09/29/2012   Procedure: ESOPHAGOGASTRODUODENOSCOPY (EGD) WITH ESOPHAGEAL DILATION;  Surgeon: Milus Banister, MD;  [facility];  Service: Endoscopy;  Laterality: N/A;  . INSERTION OF DIALYSIS CATHETER N/A 01/03/2015   Procedure: INSERTION OF DIALYSIS CATHETER RIGHT INTERNAL JUGULAR;  Surgeon: Angelia Mould, MD;  Location: [place];  Service: Vascular;  Laterality: N/A;  . LAPAROSCOPIC GASTRIC SLEEVE RESECTION N/A 07/19/2012   Procedure: LAPAROSCOPIC SLEEVE GASTRECTOMY with EGD;  Surgeon: Madilyn Hook, DO;  [facility];  Service: General;  Laterality: N/A;  laparoscopic sleeve gastrectomy with EGD  . PERIPHERAL VASCULAR CATHETERIZATION Left 05/23/2015   Procedure: Nolon Stalls;  Surgeon:  Conrad Elmore, MD;  Location: [facility];  Service: Cardiovascular;  Laterality: Left;  upper aRM  . PERIPHERAL VASCULAR CATHETERIZATION Left 05/23/2015   Procedure: Peripheral Vascular Balloon Angioplasty;  Surgeon: Conrad Leonard, MD;  Location: [place] [facility];  Service: Cardiovascular;  Laterality: Left;  av  fistula  . TUBAL LIGATION  1993  . UPPER GI ENDOSCOPY N/A 07/19/2012   Procedure: UPPER GI ENDOSCOPY;  Surgeon: Madilyn Hook, DO;  [facility];  Service: General;  Laterality: N/A;    Family History  Problem Relation Age of Onset  . Hypertension Mother   . Mitral valve prolapse Mother   . Diabetes Father   . Arthritis Other   . Hyperlipidemia Other   . Cancer Neg Hx   . Heart disease Neg Hx   . Kidney disease Neg Hx   . Stroke Neg Hx     Social History:  reports that she quit smoking about 11 years ago. Her smoking use included cigarettes. She has a 16.00 pack-year smoking history. She has never used smokeless tobacco. She reports that she does not drink alcohol or use drugs.  Allergies:  Allergies  Allergen Reactions  . Amlodipine Swelling    edema  . Clonidine Derivatives Swelling    Limbs swell  . Welchol [Colesevelam Hcl] Nausea Only    Medications: I have reviewed the patient's current medications.   Results for orders placed or performed during the hospital encounter of 02/24/18 (from the past 48 hour(s))  Basic metabolic panel     Status: Abnormal   Collection Time: 02/24/18 10:27 AM  Result Value Ref Range   Sodium 140 135 - 145 mmol/L   Potassium 3.8 3.5 - 5.1 mmol/L   Chloride 95 (L) 98 - 111 mmol/L   CO2 23 22 - 32 mmol/L   Glucose, Bld 158 (H) 70 - 99 mg/dL   BUN 43 (H) 6 - 20 mg/dL   Creatinine, Ser 12.67 (H) 0.44 - 1.00 mg/dL   Calcium 9.1 8.9 - 10.3 mg/dL   GFR calc non Af Amer 3 (L) >60 mL/min   GFR calc Af Amer 3 (L) >60 mL/min   Anion gap 22 (H) 5 - 15    Comment: Performed at [facility], [address], [place], [place] [address]  CBC     Status: Abnormal   Collection Time: 02/24/18 10:27 AM  Result Value Ref Range   WBC 8.2 4.0 - 10.5 K/uL   RBC 3.85 (L) 3.87 - 5.11 MIL/uL   Hemoglobin 12.4 12.0 - 15.0 g/dL   HCT 39.5 36.0 - 46.0 %   MCV 102.6 (H) 80.0 - 100.0 fL   MCH 32.2 26.0 - 34.0 pg   MCHC 31.4 30.0 - 36.0 g/dL   RDW  15.1 11.5 - 15.5 %   Platelets 393 150 - 400 K/uL   nRBC 0.0 0.0 - 0.2 %    Comment: Performed at [facility], [place] [address] [place], [place] [address]  Brain natriuretic peptide     Status: None   Collection Time: 02/24/18 10:27 AM  Result Value Ref Range   B Natriuretic Peptide 36.5 0.0 - 100.0 pg/mL    Comment: Performed at [facility] [address], [place], [place] [address]  I-stat troponin, ED     Status: None   Collection Time: 02/24/18 10:45 AM  Result Value Ref Range   Troponin i, poc 0.04 0.00 - 0.08 ng/mL  Comment 3            Comment: Due to the release kinetics of cTnI, a negative result within the first hours of the onset of symptoms does not rule out myocardial infarction with certainty. If myocardial infarction is still suspected, repeat the test at appropriate intervals.   I-stat troponin, ED     Status: Abnormal   Collection Time: 02/24/18  1:52 PM  Result Value Ref Range   Troponin i, poc 0.13 (HH) 0.00 - 0.08 ng/mL   Comment NOTIFIED PHYSICIAN    Comment 3            Comment: Due to the release kinetics of cTnI, a negative result within the first hours of the onset of symptoms does not rule out myocardial infarction with certainty. If myocardial infarction is still suspected, repeat the test at appropriate intervals.     Dg Chest Port 1 View  Result Date: 02/24/2018 CLINICAL DATA:  Chest pain EXAM: PORTABLE CHEST 1 VIEW COMPARISON:  08/24/2017 FINDINGS: The heart size and mediastinal contours are within normal limits. Both lungs are clear. The visualized skeletal structures are unremarkable. IMPRESSION: No active disease. Electronically Signed   By: Franchot Gallo M.D.   On: 02/24/2018 10:34    ROS: Initially, chest discomfort and shortness of breath.  Currently asymptomatic Blood pressure 111/71, pulse 69, temperature 98.2 F (36.8 C), temperature source Oral, resp. rate 16, height 5\' 5"  (1.651 m), weight 119.9 kg, SpO2 100  %. General appearance: alert, no distress and moderately obese Neck: no adenopathy, no carotid bruit, no JVD, supple, symmetrical, trachea midline and thyroid not enlarged, symmetric, no tenderness/mass/nodules Resp: clear to auscultation bilaterally Cardio: regular rate and rhythm, S1, S2 normal, no murmur, click, rub or gallop GI: soft, non-tender; bowel sounds normal; no masses,  no organomegaly and PD catheter on right side, no drainage nontender Extremities: extremities normal, atraumatic, no cyanosis or edema and Small dark area lateral on the right leg slightly tender   Assessment/Plan: 53 year old black female with hypertension, hyperlipidemia and ESRD on PD.  She now presents with chest pain 1 chest pain-follow troponin per primary team, possible cards involvement.  Currently on heparin and low-dose carvedilol 2 ESRD: Continue routine peritoneal dialysis.  Will use 1.5% fluid tonight as does not appear to be volume overloaded in the least 3 Hypertension: If anything blood pressure is lowish.  She has been using 2.5% PD fluid which will provide some ultrafiltration.  I will de-escalate this down to 1.5% 4. Anemia of ESRD: Not an issue at this time 5. Metabolic Bone Disease: We will continue her home medications of calcitriol, Sensipar and binders   Brenda Davis 02/24/2018, 4:32 PM

## 2018-02-24 NOTE — Progress Notes (Signed)
[facility] for heparin Indication: chest pain/ACS  Heparin Dosing Weight: 83.2 kg  Labs: Recent Labs    02/24/18 1027  HGB 12.4  HCT 39.5  PLT 393  CREATININE 12.67*    Assessment: 61 yof presenting with CP. Pharmacy consulted to dose heparin for ACS. Not on anticoagulation PTA. CBC wnl. No bleed issues documented. Noted ESRD on PD. Weight reported as 260 lbs in the ER per patient.  Goal of Therapy:  Heparin level 0.3-0.7 units/ml Monitor platelets by anticoagulation protocol: Yes   Plan:  Heparin 4000 unit bolus Start heparin at 1050 units/h 8h heparin level Daily heparin level/CBC Monitor s/sx bleeding  Elicia Lamp, PharmD, BCPS Clinical Pharmacist 02/24/2018 2:23 PM

## 2018-02-24 NOTE — ED Triage Notes (Signed)
Per [facility]: Patient to ED from home c/o sudden onset central chest pressure about 0850 this morning. She states she woke up with nausea and she also has new exertional dyspnea today. Patient has peritoneal dialysis - recent increase from 5 to 6 exchanges on Monday - since then, pt reports fatigue and generalized weakness. Patient given 324 ASA and 1 SL NTG PTA, decreasing discomfort from 7/10 to 5/10. EMS VS: HR 90 sinus rhythm, 142/92, RR 18, 100% RA, CBG 196. Resp e/u at rest, skin warm/dry.

## 2018-02-24 NOTE — ED Notes (Signed)
Cardiology at bedside.

## 2018-02-24 NOTE — ED Provider Notes (Signed)
[facility] EMERGENCY DEPARTMENT Provider Note   CSN: [id number] Arrival date & time: 02/24/18  2992     History   Chief Complaint Chief Complaint  Patient presents with  . Chest Pain    HPI Brenda Davis is a 53 y.o. female with PMH TIIDM on peritoneal dialysis presenting via EMS with nausea and chest pain and pressure that started this am with one episode of emesis. She was given sublingual nitro and asa and felt some relief of her pain afterward. She states the pain started on her way to work this morning and she became very SOB on walking into work. She states she has also had increased swelling and pain in her right lower leg anteriorly but has not had increased edema. Her dialysis was recently changed from five exchanges to six exchanges and she feels like her symptoms have been worse since this was done. She does not take a blood thinner. She has not recent history of travel or immobility. She does not smoke or drink.  She has no history of MI or heart attack.   HPI  Past Medical History:  Diagnosis Date  . Anemia   . Chronic kidney disease    STAGE 3 CHRONIC KIDNEY DISEASE SECONDARY TO DIABETIC GLOMERULOSCLEROSIS AND UNCONTROLLED HYPERTENSION - PER OFFICE NOTES DR. [facility].  Marland Kitchen ESRD on dialysis [facility])    "MWF; [address] (05/06/2016)  . Gout   . HCAP (healthcare-associated pneumonia) 05/06/2016  . History of blood transfusion    "related to surgery"  . Hyperlipidemia   . Hypertension   . Morbid obesity ([place])   . Pain    LEFT SHOULDER PAIN - WAS SEEN AT AN URGENT CARE - GIVEN SLING FOR COMFORT AND TOLD ROM AS TOLERATED.  Marland Kitchen Palpitations 09/24/2016  . Type II diabetes mellitus ([place])    "gastric sleeve OR corrected this" (05/06/2016)    Patient Active Problem List   Diagnosis Date Noted  . Acute upper respiratory infection 01/21/2018  . Hepatic cirrhosis ([place]) 11/24/2017  . Omental infarction ([place]) 08/25/2017  .  ESRD on peritoneal dialysis ([place]) 08/25/2017  . DM (diabetes mellitus), type 2 with renal complications ([place]) 42/68/3419  . Deficiency anemia 08/24/2017  . Elevated lipase 08/24/2017  . Chronic heart failure with preserved ejection fraction ([place]) 06/22/2017  . Hypertension 09/15/2016  . Sensorineural hearing loss (SNHL), bilateral 06/29/2016  . Allergic rhinitis 06/27/2016  . Severe persistent asthma with exacerbation 05/12/2016  . Anemia due to chronic kidney disease 05/06/2016  . Routine general medical examination at a health care facility 12/05/2015  . Cervical cancer screening 12/05/2015  . Visit for screening mammogram 01/23/2015  . Sinus pause 01/04/2015  . Gout due to renal impairment 09/13/2014  . Thiamine deficiency 12/02/2012  . Meralgia paraesthetica 11/28/2012  . S/P laparoscopic sleeve gastrectomy 11/28/2012  . B12 deficiency anemia 11/28/2012  . Hyperlipidemia with target LDL less than 100 04/09/2008  . CKD (chronic kidney disease) stage V requiring chronic dialysis (MWF) 02/08/2008  . HYPERTENSION, BENIGN ESSENTIAL 01/20/1999    Past Surgical History:  Procedure Laterality Date  . AV FISTULA PLACEMENT Left 01/03/2015   Procedure: BRACHIAL CEPHALIC ARTERIOVENOUS  FISTULA CREATION LEFT ARM;  Surgeon: Angelia Mould, MD;  Location: [facility];  Service: Vascular;  Laterality: Left;  . CARPAL TUNNEL RELEASE Right   . [place]  . CHOLECYSTECTOMY  09/14/2017  . ESOPHAGOGASTRODUODENOSCOPY N/A 09/04/2012   Procedure: ESOPHAGOGASTRODUODENOSCOPY (EGD);  Surgeon: Shanon Brow  Bary Leriche, MD;  [facility];  Service: General;  Laterality: N/A;  PF  . ESOPHAGOGASTRODUODENOSCOPY (EGD) WITH ESOPHAGEAL DILATION N/A 09/29/2012   Procedure: ESOPHAGOGASTRODUODENOSCOPY (EGD) WITH ESOPHAGEAL DILATION;  Surgeon: Milus Banister, MD;  [facility];  Service: Endoscopy;  Laterality: N/A;  . INSERTION OF DIALYSIS CATHETER N/A 01/03/2015   Procedure: INSERTION  OF DIALYSIS CATHETER RIGHT INTERNAL JUGULAR;  Surgeon: Angelia Mould, MD;  Location: [facility];  Service: Vascular;  Laterality: N/A;  . LAPAROSCOPIC GASTRIC SLEEVE RESECTION N/A 07/19/2012   Procedure: LAPAROSCOPIC SLEEVE GASTRECTOMY with EGD;  Surgeon: Madilyn Hook, DO;  [facility];  Service: General;  Laterality: N/A;  laparoscopic sleeve gastrectomy with EGD  . PERIPHERAL VASCULAR CATHETERIZATION Left 05/23/2015   Procedure: Nolon Stalls;  Surgeon: Conrad [place], MD;  Location: [facility];  Service: Cardiovascular;  Laterality: Left;  upper aRM  . PERIPHERAL VASCULAR CATHETERIZATION Left 05/23/2015   Procedure: Peripheral Vascular Balloon Angioplasty;  Surgeon: Conrad [place], MD;  Location: [facility];  Service: Cardiovascular;  Laterality: Left;  av fistula  . TUBAL LIGATION  1993  . UPPER GI ENDOSCOPY N/A 07/19/2012   Procedure: UPPER GI ENDOSCOPY;  Surgeon: Madilyn Hook, DO;  [facility];  Service: General;  Laterality: N/A;     OB History   No obstetric history on file.      Home Medications    Prior to Admission medications   Medication Sig Start Date End Date Taking? Authorizing Provider  acetaminophen (TYLENOL) 325 MG tablet Take 650 mg by mouth every 6 (six) hours as needed for mild pain.   Yes [provider]  AURYXIA 1 GM 210 MG(Fe) tablet Take 210 mg by mouth 3 (three) times daily with meals.  04/07/17  Yes [provider]  calcitRIOL (ROCALTROL) 0.5 MCG capsule Take 0.5 mcg by mouth daily. 12/24/17  Yes [provider]  calcium acetate (PHOSLO) 667 MG capsule Take 2 capsules (1,334 mg total) by mouth 3 (three) times daily with meals. 01/07/15  Yes Eugenie Filler, MD  carvedilol (COREG) 6.25 MG tablet Take 1 tablet (6.25 mg total) by mouth 2 (two) times daily with a meal. 10/15/17  Yes Marrian Salvage, FNP  Cyanocobalamin (B-12 PO) Take 1 tablet by mouth daily.   Yes [provider]  gentamicin cream  (GARAMYCIN) 0.1 % Apply 1 application topically See admin instructions. Apply to exit site daily as directed. 04/26/17  Yes [provider]  insulin aspart (NOVOLOG) 100 UNIT/ML FlexPen Inject 10 Units into the skin 3 (three) times daily with meals. 12/01/17  Yes [name], MD  Insulin Glargine (BASAGLAR KWIKPEN) 100 UNIT/ML SOPN Inject 0.5 mLs (50 Units total) into the skin daily. 12/01/17  Yes [name], MD  Multiple Vitamins-Minerals (PRORENAL + D) TABS Take 1 tablet by mouth daily. 10/29/17  Yes [provider]  multivitamin (RENA-VIT) TABS tablet Take 1 tablet by mouth at bedtime. 01/07/15  Yes Eugenie Filler, MD  thiamine (VITAMIN B-1) 100 MG tablet Take 1 tablet (100 mg total) by mouth daily. 06/21/17  Yes [name], MD  cyclobenzaprine (FLEXERIL) 10 MG tablet Take 1 tablet (10 mg total) by mouth 2 (two) times daily as needed for muscle spasms. Patient not taking: Reported on 02/03/2018 12/23/17   Enrique Sack, FNP  diclofenac (VOLTAREN) 50 MG EC tablet Take 1 tablet (50 mg total) by mouth 3 (three) times daily as needed (PAIN). Patient not taking: Reported  on 02/03/2018 12/23/17   Enrique Sack, FNP  folic acid (FOLVITE) 1 MG tablet Take 1 tablet (1 mg total) by mouth daily. Patient not taking: Reported on 02/03/2018 06/21/17   [name], MD  Insulin Pen Needle (NOVOFINE) 32G X 6 MM MISC 1 Act by Does not apply route 4 (four) times daily -  with meals and at bedtime. 11/22/17   [name], MD  ondansetron (ZOFRAN ODT) 4 MG disintegrating tablet Take 1 tablet (4 mg total) by mouth every 6 (six) hours as needed for nausea or vomiting. Patient not taking: Reported on 02/03/2018 11/04/17   Zehr, Janett Billow D, PA-C  polyethylene glycol (MIRALAX / GLYCOLAX) packet Take 17 g by mouth daily as needed. Patient not taking: Reported on 02/03/2018 05/09/16   Eugenie Filler, MD  traMADol (ULTRAM) 50 MG tablet Take 1 tablet (50 mg total) by mouth every 6 (six)  hours as needed for severe pain. Patient not taking: Reported on 02/03/2018 12/23/17   Enrique Sack, FNP    Family History Family History  Problem Relation Age of Onset  . Hypertension Mother   . Mitral valve prolapse Mother   . Diabetes Father   . Arthritis Other   . Hyperlipidemia Other   . Cancer Neg Hx   . Heart disease Neg Hx   . Kidney disease Neg Hx   . Stroke Neg Hx     Social History Social History   Tobacco Use  . Smoking status: Former Smoker    Packs/day: 1.00    Years: 16.00    Pack years: 16.00    Types: Cigarettes    Last attempt to quit: 2009    Years since quitting: 11.1  . Smokeless tobacco: Never Used  Substance Use Topics  . Alcohol use: Never    Frequency: Never  . Drug use: No     Allergies   Amlodipine; Clonidine derivatives; and Welchol [colesevelam hcl]   Review of Systems Review of Systems ROS negative except as noted in HPI.   Physical Exam Updated Vital Signs BP 122/72   Pulse 83   Temp 98 F (36.7 C) (Oral)   Resp 19   Ht 5\' 4"  (1.626 m)   Wt 117.9 kg   SpO2 99%   BMI 44.63 kg/m   Physical Exam Constitution: NAD, obese, lying supine in bed HENT: AT, [place] Eyes: eom intact, no scleral icterus  Cardio: RRR, no m/r/g  Respiratory: CTA, no wheezing rales or rhonchi  Abdominal: NTTP, soft, non-distended MSK: no LE edema, TTP anteriolateral and posterior area, darkening of skin and firmness to anterior area  Neuro: a&o, cooperative  Skin: LE skin changes on right, otherwise c/d/i; no pitting edema    ED Treatments / Results  Labs (all labs ordered are listed, but only abnormal results are displayed) Labs Reviewed  BASIC METABOLIC PANEL - Abnormal; Notable for the following components:      Result Value   Chloride 95 (*)    Glucose, Bld 158 (*)    BUN 43 (*)    Creatinine, Ser 12.67 (*)    GFR calc non Af Amer 3 (*)    GFR calc Af Amer 3 (*)    Anion gap 22 (*)    All other components within normal limits  CBC -  Abnormal; Notable for the following components:   RBC 3.85 (*)    MCV 102.6 (*)    All other components within normal limits  I-STAT TROPONIN, ED -  Abnormal; Notable for the following components:   Troponin i, poc 0.13 (*)    All other components within normal limits  BRAIN NATRIURETIC PEPTIDE  HEPARIN LEVEL (UNFRACTIONATED)  I-STAT TROPONIN, ED    EKG EKG Interpretation  Date/Time:  Thursday February 24 2018 10:04:25 EST Ventricular Rate:  77 PR Interval:    QRS Duration: 98 QT Interval:  434 QTC Calculation: 492 R Axis:   29 Text Interpretation:  Sinus rhythm Low voltage, precordial leads Borderline prolonged QT interval Confirmed by Lajean Saver [phone]) on 02/24/2018 10:11:52 AM   Radiology Dg Chest Port 1 View  Result Date: 02/24/2018 CLINICAL DATA:  Chest pain EXAM: PORTABLE CHEST 1 VIEW COMPARISON:  08/24/2017 FINDINGS: The heart size and mediastinal contours are within normal limits. Both lungs are clear. The visualized skeletal structures are unremarkable. IMPRESSION: No active disease. Electronically Signed   By: Franchot Gallo M.D.   On: 02/24/2018 10:34    Procedures Procedures (including critical care time)  Medications Ordered in ED Medications  heparin bolus via infusion 4,000 Units (has no administration in time range)  heparin ADULT infusion 100 units/mL (25000 units/26mL sodium chloride 0.45%) (has no administration in time range)     Initial Impression / Assessment and Plan / ED Course  I have reviewed the triage vital signs and the nursing notes.  Pertinent labs & imaging results that were available during my care of the patient were reviewed by me and considered in my medical decision making (see chart for details).  Clinical Course as of Feb 24 1429  Thu Feb 24, 2018  1041 52yo female with no significant heart history but has TIIDM and is on peritoneal dialysis presenting with chest pain and pressure. Initial EKG without ST elevation but will work  up for ischemia. Additionally has right anterior lower leg pain and firmness, tenderness and skin color changes that appear to be chronic. No LE swelling or history that would be concerning for DVT.    [JS]  1137 Initial troponin and labs within normal limits. Will trend troponin to confirm no ischemic event.    [JS]  1406 Troponin i, poc(!!): 0.13 [JS]    Clinical Course User Index [JS] Niley Helbig A, DO    Repeat Troponin .13, trending upward. Heparin gtt ordered. Discussed with [facility] for admission and cardiology consult pending.   Final Clinical Impressions(s) / ED Diagnoses   Final diagnoses:  Chest pain, unspecified type  NSTEMI (non-ST elevated myocardial infarction) [facility])    ED Discharge Orders    None       Sharvil Hoey A, DO 02/24/18 1432    Lajean Saver, MD 02/25/18 [phone]

## 2018-02-24 NOTE — H&P (Signed)
History and Physical    Brenda Davis QJJ:[id number] DOB: [birth year] DOA: 02/24/2018  PCP: [name], MD  Patient coming from: Home.  I have personally briefly reviewed patient's old medical records available.   Chief Complaint: Chest pain, nausea.  HPI: Brenda Davis is a 53 y.o. female with medical history significant of ESRD on peritoneal dialysis, hypertension, diabetes presents to the emergency room with 1 episode of left precordial chest pain and nausea.  According to the patient, she recently had change in her dialysis exchanges from 5-6.  She was feeling little bit dry since yesterday.  Today morning, she woke up she was having some nausea.  Went to work.  When she was at work, she suddenly felt sharp, left precordial, moderate chest pain and some shortness of breath.  No radiation of the pain.  Pain persisted for about 1 hour and improved after EMS gave her a full dose of aspirin and nitroglycerin.  Her chest pain has improved since then. Denies any headache, dizziness, syncopal episode.  Denies any palpitations.  No history of coronary artery disease.  She is on peritoneal dialysis.  Denies any recent flulike symptoms cough cold.  No sick contacts.  No recent travel. ED Course: Hemodynamically stable.  On room air.  Chest pain-free.  Initial troponin were 0.03.  Subsequent troponin is 0.13.  Twelve-lead EKG shows normal sinus rhythm but no acute ST-T wave changes.  ER physician discussed with cardiology.  Patient is started on heparin infusion.  Review of Systems: As per HPI otherwise 10 point review of systems negative.    Past Medical History:  Diagnosis Date  . Anemia   . Chronic kidney disease    STAGE 3 CHRONIC KIDNEY DISEASE SECONDARY TO DIABETIC GLOMERULOSCLEROSIS AND UNCONTROLLED HYPERTENSION - PER OFFICE NOTES DR. Florene Glen [facility].  Marland Kitchen ESRD on dialysis [facility])    "MWF; [address] (05/06/2016)  . Gout   . HCAP  (healthcare-associated pneumonia) 05/06/2016  . History of blood transfusion    "related to surgery"  . Hyperlipidemia   . Hypertension   . Morbid obesity ([place])   . Pain    LEFT SHOULDER PAIN - WAS SEEN AT AN URGENT CARE - GIVEN SLING FOR COMFORT AND TOLD ROM AS TOLERATED.  Marland Kitchen Palpitations 09/24/2016  . Type II diabetes mellitus ([place])    "gastric sleeve OR corrected this" (05/06/2016)    Past Surgical History:  Procedure Laterality Date  . AV FISTULA PLACEMENT Left 01/03/2015   Procedure: BRACHIAL CEPHALIC ARTERIOVENOUS  FISTULA CREATION LEFT ARM;  Surgeon: Angelia Mould, MD;  Location: [place];  Service: Vascular;  Laterality: Left;  . CARPAL TUNNEL RELEASE Right   . [place]  . CHOLECYSTECTOMY  09/14/2017  . ESOPHAGOGASTRODUODENOSCOPY N/A 09/04/2012   Procedure: ESOPHAGOGASTRODUODENOSCOPY (EGD);  Surgeon: Shann Medal, MD;  Location: [facility];  Service: General;  Laterality: N/A;  PF  . ESOPHAGOGASTRODUODENOSCOPY (EGD) WITH ESOPHAGEAL DILATION N/A 09/29/2012   Procedure: ESOPHAGOGASTRODUODENOSCOPY (EGD) WITH ESOPHAGEAL DILATION;  Surgeon: Milus Banister, MD;  Location: [facility];  Service: Endoscopy;  Laterality: N/A;  . INSERTION OF DIALYSIS CATHETER N/A 01/03/2015   Procedure: INSERTION OF DIALYSIS CATHETER RIGHT INTERNAL JUGULAR;  Surgeon: Angelia Mould, MD;  Location: [place];  Service: Vascular;  Laterality: N/A;  . LAPAROSCOPIC GASTRIC SLEEVE RESECTION N/A 07/19/2012   Procedure: LAPAROSCOPIC SLEEVE GASTRECTOMY with EGD;  Surgeon: Madilyn Hook, DO;  Location: [facility];  Service: General;  Laterality: N/A;  laparoscopic sleeve gastrectomy with EGD  . PERIPHERAL VASCULAR CATHETERIZATION Left 05/23/2015   Procedure: Nolon Stalls;  Surgeon: Conrad Emmons, MD;  Location: [place] [facility];  Service: Cardiovascular;  Laterality: Left;  upper aRM  . PERIPHERAL VASCULAR CATHETERIZATION Left 05/23/2015   Procedure: Peripheral Vascular Balloon Angioplasty;   Surgeon: Conrad Datil, MD;  Location: [place] [facility];  Service: Cardiovascular;  Laterality: Left;  av fistula  . TUBAL LIGATION  1993  . UPPER GI ENDOSCOPY N/A 07/19/2012   Procedure: UPPER GI ENDOSCOPY;  Surgeon: Madilyn Hook, DO;  Location: [facility];  Service: General;  Laterality: N/A;     reports that she quit smoking about 11 years ago. Her smoking use included cigarettes. She has a 16.00 pack-year smoking history. She has never used smokeless tobacco. She reports that she does not drink alcohol or use drugs.  Allergies  Allergen Reactions  . Amlodipine Swelling    edema  . Clonidine Derivatives Swelling    Limbs swell  . Welchol [Colesevelam Hcl] Nausea Only    Family History  Problem Relation Age of Onset  . Hypertension Mother   . Mitral valve prolapse Mother   . Diabetes Father   . Arthritis Other   . Hyperlipidemia Other   . Cancer Neg Hx   . Heart disease Neg Hx   . Kidney disease Neg Hx   . Stroke Neg Hx      Prior to Admission medications   Medication Sig Start Date End Date Taking? Authorizing Provider  acetaminophen (TYLENOL) 325 MG tablet Take 650 mg by mouth every 6 (six) hours as needed for mild pain.   Yes [provider]  AURYXIA 1 GM 210 MG(Fe) tablet Take 210 mg by mouth 3 (three) times daily with meals.  04/07/17  Yes [provider]  calcitRIOL (ROCALTROL) 0.5 MCG capsule Take 0.5 mcg by mouth daily. 12/24/17  Yes [provider]  calcium acetate (PHOSLO) 667 MG capsule Take 2 capsules (1,334 mg total) by mouth 3 (three) times daily with meals. 01/07/15  Yes Eugenie Filler, MD  carvedilol (COREG) 6.25 MG tablet Take 1 tablet (6.25 mg total) by mouth 2 (two) times daily with a meal. 10/15/17  Yes Marrian Salvage, FNP  Cyanocobalamin (B-12 PO) Take 1 tablet by mouth daily.   Yes [provider]  gentamicin cream (GARAMYCIN) 0.1 % Apply 1 application topically See admin instructions. Apply to exit site daily as  directed. 04/26/17  Yes [provider]  insulin aspart (NOVOLOG) 100 UNIT/ML FlexPen Inject 10 Units into the skin 3 (three) times daily with meals. 12/01/17  Yes [name], MD  Insulin Glargine (BASAGLAR KWIKPEN) 100 UNIT/ML SOPN Inject 0.5 mLs (50 Units total) into the skin daily. 12/01/17  Yes [name], MD  Multiple Vitamins-Minerals (PRORENAL + D) TABS Take 1 tablet by mouth daily. 10/29/17  Yes [provider]  multivitamin (RENA-VIT) TABS tablet Take 1 tablet by mouth at bedtime. 01/07/15  Yes Eugenie Filler, MD  thiamine (VITAMIN B-1) 100 MG tablet Take 1 tablet (100 mg total) by mouth daily. 06/21/17  Yes [name], MD  Insulin Pen Needle (NOVOFINE) 32G X 6 MM MISC 1 Act by Does not apply route 4 (four) times daily -  with meals and at bedtime. 11/22/17   [name], MD    Physical Exam: Vitals:   02/24/18 1138 02/24/18 1200 02/24/18 1300 02/24/18 1330  BP: 121/76 117/74 125/74 122/72  Pulse: 78 75 74 83  Resp: 18 13 16 19   Temp:      TempSrc:      SpO2: 99% 100% 100% 99%  Weight:    117.9 kg  Height:    5' 4"  (1.626 m)    Constitutional: NAD, calm, comfortable Vitals:   02/24/18 1138 02/24/18 1200 02/24/18 1300 02/24/18 1330  BP: 121/76 117/74 125/74 122/72  Pulse: 78 75 74 83  Resp: 18 13 16 19   Temp:      TempSrc:      SpO2: 99% 100% 100% 99%  Weight:    117.9 kg  Height:    5' 4"  (1.626 m)   Eyes: PERRL, lids and conjunctivae normal Comfortable on room air. ENMT: Mucous membranes are moist. Posterior pharynx clear of any exudate or lesions.Normal dentition.  Neck: normal, supple, no masses, no thyromegaly Respiratory: clear to auscultation bilaterally, no wheezing, no crackles. Normal respiratory effort. No accessory muscle use.  Cardiovascular: Regular rate and rhythm, no murmurs / rubs / gallops. No extremity edema. 2+ pedal pulses. No carotid bruits.  Abdomen: no tenderness, no masses palpated. No hepatosplenomegaly.  Bowel sounds positive.  Obese and pendulous.  There is PD catheter on the right lower quadrant which is clean and dry and nontender. Musculoskeletal: no clubbing / cyanosis. No joint deformity upper and lower extremities. Good ROM, no contractures. Normal muscle tone.  Patient has small boggy swelling and tenderness along the right anterior aspect of the leg.  Homans sign negative.  No calf tenderness. Skin: no rashes, lesions, ulcers. No induration Neurologic: CN 2-12 grossly intact. Sensation intact, DTR normal. Strength 5/5 in all 4.  Psychiatric: Normal judgment and insight. Alert and oriented x 3. Normal mood.     Labs on Admission: I have personally reviewed following labs and imaging studies  CBC: Recent Labs  Lab 02/24/18 1027  WBC 8.2  HGB 12.4  HCT 39.5  MCV 102.6*  PLT 646   Basic Metabolic Panel: Recent Labs  Lab 02/24/18 1027  NA 140  K 3.8  CL 95*  CO2 23  GLUCOSE 158*  BUN 43*  CREATININE 12.67*  CALCIUM 9.1   GFR: Estimated Creatinine Clearance: 6.5 mL/min (A) (by C-G formula based on SCr of 12.67 mg/dL (H)). Liver Function Tests: No results for input(s): AST, ALT, ALKPHOS, BILITOT, PROT, ALBUMIN in the last 168 hours. No results for input(s): LIPASE, AMYLASE in the last 168 hours. No results for input(s): AMMONIA in the last 168 hours. Coagulation Profile: No results for input(s): INR, PROTIME in the last 168 hours. Cardiac Enzymes: No results for input(s): CKTOTAL, CKMB, CKMBINDEX, TROPONINI in the last 168 hours. BNP (last 3 results) No results for input(s): PROBNP in the last 8760 hours. HbA1C: No results for input(s): HGBA1C in the last 72 hours. CBG: No results for input(s): GLUCAP in the last 168 hours. Lipid Profile: No results for input(s): CHOL, HDL, LDLCALC, TRIG, CHOLHDL, LDLDIRECT in the last 72 hours. Thyroid Function Tests: No results for input(s): TSH, T4TOTAL, FREET4, T3FREE, THYROIDAB in the last 72 hours. Anemia Panel: No  results for input(s): VITAMINB12, FOLATE, FERRITIN, TIBC, IRON, RETICCTPCT in the last 72 hours. Urine analysis:    Component Value Date/Time   COLORURINE YELLOW 10/28/2017 0839   APPEARANCEUR Cloudy (A) 10/28/2017 0839   LABSPEC 1.020 10/28/2017 0839   PHURINE 6.0 10/28/2017 0839   GLUCOSEU >=1000 (A) 10/28/2017 0839   HGBUR MODERATE (A) 10/28/2017 0839   HGBUR negative 09/17/2009 0000  [place] NEGATIVE 10/28/2017 [place] 10/28/2017 0839   PROTEINUR 100 (A) 09/03/2017 1959   UROBILINOGEN 0.2 10/28/2017 0839   NITRITE NEGATIVE 10/28/2017 0839   LEUKOCYTESUR MODERATE (A) 10/28/2017 0839    Radiological Exams on Admission: Dg Chest Port 1 View  Result Date: 02/24/2018 CLINICAL DATA:  Chest pain EXAM: PORTABLE CHEST 1 VIEW COMPARISON:  08/24/2017 FINDINGS: The heart size and mediastinal contours are within normal limits. Both lungs are clear. The visualized skeletal structures are unremarkable. IMPRESSION: No active disease. Electronically Signed   By: Franchot Gallo M.D.   On: 02/24/2018 10:34    EKG: Independently reviewed.  Low voltage.  No ST-T wave changes.  QTC is 492.  Assessment/Plan Principal Problem:   Chest pain Active Problems:   Hyperlipidemia with target LDL less than 100   HYPERTENSION, BENIGN ESSENTIAL   ESRD on peritoneal dialysis ([place])    1.  Chest pain: We will admit patient to the telemetry unit given severity of symptoms. Currently chest pain improved. Cycle EKG and troponins.Supplemental oxygen to keep saturations more than 90%.  Received aspirin.  Will continue.  Nitroglycerin and Morphine for severe and recurrent pain. Therapeutic anticoagulation with heparin was started in the ER.  Patient does not have ongoing chest pain.   Will refer to cardiology whether she will benefit with continuing or not.    2.  ESRD on peritoneal dialysis: I called and discussed case with nephrology.  She will be seen and will receive dialysis tonight.  3.   Hypertension: Fairly stable.  Resume home medications.  4.  Type 2 diabetes: Patient is on insulin at home.  She can continue long-acting insulin and prandial insulin as well as sliding scale to cover.  Fairly stable as per patient.   DVT prophylaxis: Heparin infusion. Code Status: Full code. Family Communication: Husband at the bedside. Disposition Plan: Home. Consults called: Cardiology, nephrology. Admission status: Observation.   Barb Merino MD [facility] Pager [phone]  If 7PM-7AM, please contact night-coverage [email or web address] Password [email or web address]  02/24/2018, 2:44 PM

## 2018-02-25 ENCOUNTER — Other Ambulatory Visit: Payer: Self-pay

## 2018-02-25 ENCOUNTER — Encounter (HOSPITAL_COMMUNITY)
Admission: EM | Disposition: A | Discharge status: Home / Self Care | Payer: Self-pay | Source: Home / Self Care | Attending: Emergency Medicine

## 2018-02-25 DIAGNOSIS — I214 Non-ST elevation (NSTEMI) myocardial infarction: Secondary | ICD-10-CM

## 2018-02-25 DIAGNOSIS — N186 End stage renal disease: Secondary | ICD-10-CM | POA: Diagnosis not present

## 2018-02-25 DIAGNOSIS — I2510 Atherosclerotic heart disease of native coronary artery without angina pectoris: Secondary | ICD-10-CM

## 2018-02-25 DIAGNOSIS — E785 Hyperlipidemia, unspecified: Secondary | ICD-10-CM | POA: Diagnosis not present

## 2018-02-25 DIAGNOSIS — R079 Chest pain, unspecified: Secondary | ICD-10-CM

## 2018-02-25 DIAGNOSIS — I25119 Atherosclerotic heart disease of native coronary artery with unspecified angina pectoris: Secondary | ICD-10-CM

## 2018-02-25 DIAGNOSIS — I120 Hypertensive chronic kidney disease with stage 5 chronic kidney disease or end stage renal disease: Secondary | ICD-10-CM | POA: Diagnosis not present

## 2018-02-25 HISTORY — PX: LEFT HEART CATH AND CORONARY ANGIOGRAPHY: CATH118249

## 2018-02-25 LAB — HEPARIN LEVEL (UNFRACTIONATED)
Heparin Unfractionated: 0.29 IU/mL — ABNORMAL LOW (ref 0.30–0.70)
Heparin Unfractionated: 0.49 IU/mL (ref 0.30–0.70)

## 2018-02-25 LAB — CBC
HCT: 35.1 % — ABNORMAL LOW (ref 36.0–46.0)
Hemoglobin: 11.3 g/dL — ABNORMAL LOW (ref 12.0–15.0)
MCH: 32.8 pg (ref 26.0–34.0)
MCHC: 32.2 g/dL (ref 30.0–36.0)
MCV: 101.7 fL — ABNORMAL HIGH (ref 80.0–100.0)
Platelets: 342 10*3/uL (ref 150–400)
RBC: 3.45 MIL/uL — ABNORMAL LOW (ref 3.87–5.11)
RDW: 15.1 % (ref 11.5–15.5)
WBC: 7.7 10*3/uL (ref 4.0–10.5)
nRBC: 0 % (ref 0.0–0.2)

## 2018-02-25 LAB — RENAL FUNCTION PANEL
Albumin: 2.8 g/dL — ABNORMAL LOW (ref 3.5–5.0)
Anion gap: 15 (ref 5–15)
BUN: 43 mg/dL — ABNORMAL HIGH (ref 6–20)
CO2: 24 mmol/L (ref 22–32)
Calcium: 8.5 mg/dL — ABNORMAL LOW (ref 8.9–10.3)
Chloride: 100 mmol/L (ref 98–111)
Creatinine, Ser: 12.71 mg/dL — ABNORMAL HIGH (ref 0.44–1.00)
GFR calc Af Amer: 3 mL/min — ABNORMAL LOW (ref >60)
GFR calc non Af Amer: 3 mL/min — ABNORMAL LOW (ref >60)
Glucose, Bld: 141 mg/dL — ABNORMAL HIGH (ref 70–99)
Phosphorus: 7.5 mg/dL — ABNORMAL HIGH (ref 2.5–4.6)
Potassium: 3.3 mmol/L — ABNORMAL LOW (ref 3.5–5.1)
Sodium: 139 mmol/L (ref 135–145)

## 2018-02-25 LAB — GLUCOSE, CAPILLARY
Glucose-Capillary: 136 mg/dL — ABNORMAL HIGH (ref 70–99)
Glucose-Capillary: 120 mg/dL — ABNORMAL HIGH (ref 70–99)
Glucose-Capillary: 91 mg/dL (ref 70–99)
Glucose-Capillary: 121 mg/dL — ABNORMAL HIGH (ref 70–99)

## 2018-02-25 LAB — POCT ACTIVATED CLOTTING TIME: Activated Clotting Time: 241 seconds

## 2018-02-25 LAB — MAGNESIUM: Magnesium: 2.1 mg/dL (ref 1.7–2.4)

## 2018-02-25 LAB — TROPONIN I: Troponin I: 0.15 ng/mL (ref <0.03)

## 2018-02-25 SURGERY — LEFT HEART CATH AND CORONARY ANGIOGRAPHY
Anesthesia: LOCAL

## 2018-02-25 MED ORDER — POTASSIUM CHLORIDE CRYS ER 20 MEQ PO TBCR: 20.0000 meq | EXTENDED_RELEASE_TABLET | Freq: Once | ORAL | Status: DC

## 2018-02-25 MED ORDER — SODIUM CHLORIDE 0.9 % IV SOLN: 250.0000 mL | INTRAVENOUS | Status: DC | PRN

## 2018-02-25 MED ORDER — SODIUM CHLORIDE 0.9 % IV SOLN
INTRAVENOUS | Status: DC
  Administered 2018-02-25: 12:00:00 via INTRAVENOUS

## 2018-02-25 MED ORDER — FENTANYL CITRATE (PF) 100 MCG/2ML IJ SOLN
INTRAMUSCULAR | Status: AC
  Filled 2018-02-25: qty 2

## 2018-02-25 MED ORDER — MIDAZOLAM HCL 2 MG/2ML IJ SOLN
INTRAMUSCULAR | Status: DC | PRN
  Administered 2018-02-25 (×2): 1 mg via INTRAVENOUS

## 2018-02-25 MED ORDER — IOHEXOL 350 MG/ML SOLN
INTRAVENOUS | Status: DC | PRN
  Administered 2018-02-25: 118 mL via INTRAVENOUS

## 2018-02-25 MED ORDER — HEPARIN SODIUM (PORCINE) 1000 UNIT/ML IJ SOLN
INTRAMUSCULAR | Status: AC
  Filled 2018-02-25: qty 1

## 2018-02-25 MED ORDER — SODIUM CHLORIDE 0.9% FLUSH: 3.0000 mL | INTRAVENOUS | Status: DC | PRN

## 2018-02-25 MED ORDER — HEPARIN (PORCINE) IN NACL 1000-0.9 UT/500ML-% IV SOLN
INTRAVENOUS | Status: DC | PRN
  Administered 2018-02-25 (×2): 500 mL

## 2018-02-25 MED ORDER — HEPARIN (PORCINE) IN NACL 1000-0.9 UT/500ML-% IV SOLN
INTRAVENOUS | Status: AC
  Filled 2018-02-25: qty 500

## 2018-02-25 MED ORDER — MIDAZOLAM HCL 2 MG/2ML IJ SOLN
INTRAMUSCULAR | Status: AC
  Filled 2018-02-25: qty 2

## 2018-02-25 MED ORDER — FENTANYL CITRATE (PF) 100 MCG/2ML IJ SOLN
INTRAMUSCULAR | Status: DC | PRN
  Administered 2018-02-25 (×3): 25 ug via INTRAVENOUS

## 2018-02-25 MED ORDER — SODIUM CHLORIDE 0.9% FLUSH
3.0000 mL | Freq: Two times a day (BID) | INTRAVENOUS | Status: DC
  Administered 2018-02-25: 22:00:00 3 mL via INTRAVENOUS

## 2018-02-25 MED ORDER — LIDOCAINE HCL (PF) 1 % IJ SOLN
INTRAMUSCULAR | Status: AC
  Filled 2018-02-25: qty 30

## 2018-02-25 MED ORDER — HEPARIN SODIUM (PORCINE) 1000 UNIT/ML IJ SOLN
INTRAMUSCULAR | Status: DC | PRN
  Administered 2018-02-25: 8000 [IU] via INTRAVENOUS

## 2018-02-25 MED ORDER — LIDOCAINE HCL (PF) 1 % IJ SOLN
INTRAMUSCULAR | Status: DC | PRN
  Administered 2018-02-25: 2 mL

## 2018-02-25 SURGICAL SUPPLY — 13 items
CATH INFINITI 5FR MULTPACK ANG (CATHETERS) ×1 IMPLANT
CATH VISTA GUIDE 6FR JR4 (CATHETERS) ×1 IMPLANT
CLOSURE MYNX CONTROL 5F (Vascular Products) ×1 IMPLANT
GUIDEWIRE PRESSURE COMET II (WIRE) ×1 IMPLANT
KIT HEART LEFT (KITS) ×2 IMPLANT
PACK CARDIAC CATHETERIZATION (CUSTOM PROCEDURE TRAY) ×2 IMPLANT
SHEATH PINNACLE 5F 10CM (SHEATH) ×1 IMPLANT
SHEATH PINNACLE 6F 10CM (SHEATH) ×1 IMPLANT
SHEATH PROBE COVER 6X72 (BAG) ×1 IMPLANT
SYR MEDRAD MARK 7 150ML (SYRINGE) ×2 IMPLANT
TRANSDUCER W/STOPCOCK (MISCELLANEOUS) ×2 IMPLANT
TUBING CIL FLEX 10 FLL-RA (TUBING) ×2 IMPLANT
WIRE EMERALD 3MM-J .035X150CM (WIRE) ×1 IMPLANT

## 2018-02-25 NOTE — Plan of Care (Signed)
  Problem: Education: Goal: Knowledge of General Education information will improve Description: Including pain rating scale, medication(s)/side effects and non-pharmacologic comfort measures Outcome: Progressing   Problem: Activity: Goal: Risk for activity intolerance will decrease Outcome: Progressing   Problem: Nutrition: Goal: Adequate nutrition will be maintained Outcome: Progressing   Problem: Coping: Goal: Level of anxiety will decrease Outcome: Progressing   Problem: Elimination: Goal: Will not experience complications related to bowel motility Outcome: Progressing Goal: Will not experience complications related to urinary retention Outcome: Progressing   Problem: Pain Managment: Goal: General experience of comfort will improve Outcome: Progressing   

## 2018-02-25 NOTE — Progress Notes (Signed)
CRITICAL VALUE ALERT  Critical Value:  Troponin 0.15  Pt denies chest pain, nausea or vomiting. Pt in bed comfortable undergoing peritoneal dialysis    Date & Time Notied:  02/25/2018 @  0559  Provider Notified: [place]  Orders Received/Actions taken: Awaiting orders

## 2018-02-25 NOTE — Progress Notes (Addendum)
Progress Note    Brenda Davis  AJG:[id number] DOB: 1965-08-26  DOA: 02/24/2018 PCP: [name], MD    Brief Narrative:   Chief complaint: Chest pain  Medical records reviewed and are as summarized below:  Brenda Davis is an 53 y.o. female with pmh of ESRD on PD, HTN, and DM type II; who presented with complaints of chest pain.  Assessment/Plan:   Principal Problem:   Chest pain Active Problems:   Hyperlipidemia with target LDL less than 70   HYPERTENSION, BENIGN ESSENTIAL   ESRD on peritoneal dialysis ([place])  Chest pain,NSTEMI: Patient presented with complaints of chest pain relieved with nitroglycerin.  EKG was unchanged.  Troponins positive up to 0.15.  Patient had been placed on a heparin drip.  Cardiology consulted and plan for catheterization. -N.p.o.  -Heparin per pharmacy -Continue Coreg and aspirin. -Follow-up cardiac cath  -Appreciate cardiology consultative services will follow further recommendations  ESRD on peritoneal dialysis: Follows with Dr. Marval Regal patient setting. -[place] nephrology consultative services -Peritoneal dialysis per nephrology  Hyperlipidemia: Last lipid panel noted LDL of 126 on 08/2017.  Goal LDL less than 70.  Patient currently not on statin.  -Check lipid panel -Start atorvastatin  Essential hypertension -Continue Coreg   Body mass index is 44.06 kg/m.   Family Communication/Anticipated D/C date and plan/Code Status   DVT prophylaxis: Heparin drip Code Status: Full Code.  Family Communication: Discussed plan of care with the patient family present at bedside Disposition Plan: Possible discharge home in 1 to 2 days   Medical Consultants:    Cardiology   Anti-Infectives:    None  Subjective:   Patient denies having any complaints of chest pain at this time.  Yesterday when she was having the symptoms aspirin and nitroglycerin helped to relieve  them.  Objective:    Vitals:   02/24/18 2200 02/25/18 0445 02/25/18 0800 02/25/18 1157  BP: 119/74 138/78 (!) 141/88 (!) 117/59  Pulse: 77 78  76  Resp: 16 16  16   Temp: 98.2 F (36.8 C) 98.1 F (36.7 C)  98 F (36.7 C)  TempSrc: Oral Oral  Oral  SpO2: 100% 99%  97%  Weight:      Height:        Intake/Output Summary (Last 24 hours) at 02/25/2018 1411 Last data filed at 02/24/2018 1800 Gross per 24 hour  Intake 240 ml  Output -  Net 240 ml   Filed Weights   02/24/18 1330 02/24/18 1610 02/24/18 1754  Weight: 117.9 kg 119.9 kg 120.1 kg    Exam: Constitutional: NAD, calm, comfortable Eyes: PERRL, lids and conjunctivae normal ENMT: Mucous membranes are moist. Posterior pharynx clear of any exudate or lesions.Normal dentition.  Neck: normal, supple, no masses, no thyromegaly Respiratory: clear to auscultation bilaterally, no wheezing, no crackles. Normal respiratory effort. No accessory muscle use.  Cardiovascular: Regular rate and rhythm, no murmurs / rubs / gallops. No extremity edema. 2+ pedal pulses. No carotid bruits.  Abdomen: no tenderness, no masses palpated. No hepatosplenomegaly. Bowel sounds positive.  Musculoskeletal: no clubbing / cyanosis. No joint deformity upper and lower extremities. Good ROM, no contractures. Normal muscle tone.  Skin: no rashes, lesions, ulcers. No induration Neurologic: CN 2-12 grossly intact. Sensation intact, DTR normal. Strength 5/5 in all 4.  Psychiatric: Normal judgment and insight. Alert and oriented x 3. Normal mood.    Data Reviewed:   I have personally reviewed following labs and imaging studies:  Labs: Labs  show the following:   Basic Metabolic Panel: Recent Labs  Lab 02/24/18 1027 02/25/18 0425  NA 140 139  K 3.8 3.3*  CL 95* 100  CO2 23 24  GLUCOSE 158* 141*  BUN 43* 43*  CREATININE 12.67* 12.71*  CALCIUM 9.1 8.5*  MG  --  2.1  PHOS  --  7.5*   GFR Estimated Creatinine Clearance: 6.6 mL/min (A) (by C-G  formula based on SCr of 12.71 mg/dL (H)). Liver Function Tests: Recent Labs  Lab 02/25/18 0425  ALBUMIN 2.8*   No results for input(s): LIPASE, AMYLASE in the last 168 hours. No results for input(s): AMMONIA in the last 168 hours. Coagulation profile No results for input(s): INR, PROTIME in the last 168 hours.  CBC: Recent Labs  Lab 02/24/18 1027 02/25/18 0425  WBC 8.2 7.7  HGB 12.4 11.3*  HCT 39.5 35.1*  MCV 102.6* 101.7*  PLT 393 342   Cardiac Enzymes: Recent Labs  Lab 02/24/18 1727 02/24/18 2256 02/25/18 0425  TROPONINI 0.12* 0.13* 0.15*   BNP (last 3 results) No results for input(s): PROBNP in the last 8760 hours. CBG: Recent Labs  Lab 02/24/18 1656 02/24/18 2156 02/25/18 0642 02/25/18 1131  GLUCAP 127* 160* 136* 120*   D-Dimer: No results for input(s): DDIMER in the last 72 hours. Hgb A1c: No results for input(s): HGBA1C in the last 72 hours. Lipid Profile: No results for input(s): CHOL, HDL, LDLCALC, TRIG, CHOLHDL, LDLDIRECT in the last 72 hours. Thyroid function studies: No results for input(s): TSH, T4TOTAL, T3FREE, THYROIDAB in the last 72 hours.  Invalid input(s): FREET3 Anemia work up: No results for input(s): VITAMINB12, FOLATE, FERRITIN, TIBC, IRON, RETICCTPCT in the last 72 hours. Sepsis Labs: Recent Labs  Lab 02/24/18 1027 02/25/18 0425  WBC 8.2 7.7    Microbiology No results found for this or any previous visit (from the past 240 hour(s)).  Procedures and diagnostic studies:  Dg Chest Port 1 View  Result Date: 02/24/2018 CLINICAL DATA:  Chest pain EXAM: PORTABLE CHEST 1 VIEW COMPARISON:  08/24/2017 FINDINGS: The heart size and mediastinal contours are within normal limits. Both lungs are clear. The visualized skeletal structures are unremarkable. IMPRESSION: No active disease. Electronically Signed   By: Franchot Gallo M.D.   On: 02/24/2018 10:34    Medications:   . aspirin EC  325 mg Oral Daily  . calcitRIOL  0.5 mcg Oral  Daily  . calcium acetate  1,334 mg Oral TID WC  . carvedilol  6.25 mg Oral BID WC  . cinacalcet  60 mg Oral Q supper  . ferric citrate  420 mg Oral TID WC  . gentamicin cream  1 application Topical Daily  . insulin aspart  0-9 Units Subcutaneous TID WC  . insulin aspart  10 Units Subcutaneous TID WC  . insulin glargine  50 Units Subcutaneous Daily  . multivitamin  1 tablet Oral QHS  . potassium chloride  20 mEq Oral Once  . thiamine  100 mg Oral Daily   Continuous Infusions: . sodium chloride 10 mL/hr at 02/25/18 1200  . dialysis solution 1.5% low-MG/low-CA    . heparin 1,200 Units/hr (02/25/18 1212)     LOS: 0 days   Henley Blyth A Taeden Geller  [facility]   *Please refer to [email or web address], password [email or web address] to get updated schedule on who will round on this patient, as hospitalists switch teams weekly. If 7PM-7AM, please contact night-coverage at [email or web address], password [email or web address] for any overnight needs.

## 2018-02-25 NOTE — Progress Notes (Signed)
Pt aware of possible Left Heart Cath later today and verbalizes understanding of NPO status.

## 2018-02-25 NOTE — Interval H&P Note (Signed)
History and Physical Interval Note:  02/25/2018 3:25 PM  Brenda Davis  has presented today for surgery, with the diagnosis of ACS/Nstemi versus demand ischemia versus.   The various methods of treatment have been discussed with the patient and family. After consideration of risks, benefits and other options for treatment, the patient has consented to  Procedure(s): LEFT HEART CATH AND CORONARY ANGIOGRAPHY (N/A) as a surgical intervention .  The patient's history has been reviewed, patient examined, no change in status, stable for surgery.  I have reviewed the patient's chart and labs.  Questions were answered to the patient's satisfaction.     Cath Lab Visit (complete for each Cath Lab visit)  Clinical Evaluation Leading to the Procedure:   ACS: Yes.    Non-ACS:    Anginal Classification: CCS III  Anti-ischemic medical therapy: Minimal Therapy (1 class of medications)  Non-Invasive Test Results: No non-invasive testing performed  Prior CABG: No previous CABG   Glenetta Hew

## 2018-02-25 NOTE — Progress Notes (Signed)
[facility] NEPHROLOGY PROGRESS NOTE  Assessment/ Plan: Pt is a 53 y.o. yo female with hypertension, ESRD on peritoneal dialysis follows with Dr. Marval Regal, admitted with atypical chest pain.  Dialyzes at home-6 exchanges, volume 2500, dwell time 1-1/2 hours, 10-minute fill, 25-minute drain She says that she always uses 2.5%-but has noted blood pressure being low.  She is on calcitriol 0.5 mcg daily, Sensipar 60 mg daily, a mixture of PhosLo and Auryxia for phosphate binding 2 pills each  #Chest pain/non-STEMI: Improved with nitroglycerin, currently on heparin.  Cardiologist planning for cardiac cath.  # ESRD on PD: Using 1.5% because of hypotensive episode, patient is euvolemic on exam.  Continue CCPD, 6 exchanges.  # Anemia: Hemoglobin acceptable, no ESA.  # Secondary hyperparathyroidism: Continue calcitriol, Sensipar and phosphate binder.  Monitor Ca, phosphorus.  #Hypokalemia: Replete KCl 20 mEq.  # HTN/volume: Looks euvolemic on exam.  Blood pressure acceptable.  Subjective: Seen and examined.  Reports chest pain is improved with the nitroglycerin.  No shortness of breath, nausea, vomiting, headache or dizziness.  Her husband at bedside. Objective Vital signs in last 24 hours: Vitals:   02/24/18 1754 02/24/18 2200 02/25/18 0445 02/25/18 0800  BP: 108/63 119/74 138/78 (!) 141/88  Pulse: 79 77 78   Resp: 16 16 16    Temp: 98.1 F (36.7 C) 98.2 F (36.8 C) 98.1 F (36.7 C)   TempSrc: Oral Oral Oral   SpO2: 100% 100% 99%   Weight: 120.1 kg     Height:       Weight change:   Intake/Output Summary (Last 24 hours) at 02/25/2018 1108 Last data filed at 02/24/2018 1800 Gross per 24 hour  Intake 240 ml  Output -  Net 240 ml       Labs: Basic Metabolic Panel: Recent Labs  Lab 02/24/18 1027 02/25/18 0425  NA 140 139  K 3.8 3.3*  CL 95* 100  CO2 23 24  GLUCOSE 158* 141*  BUN 43* 43*  CREATININE 12.67* 12.71*  CALCIUM 9.1 8.5*  PHOS  --  7.5*    Liver Function Tests: Recent Labs  Lab 02/25/18 0425  ALBUMIN 2.8*   No results for input(s): LIPASE, AMYLASE in the last 168 hours. No results for input(s): AMMONIA in the last 168 hours. CBC: Recent Labs  Lab 02/24/18 1027 02/25/18 0425  WBC 8.2 7.7  HGB 12.4 11.3*  HCT 39.5 35.1*  MCV 102.6* 101.7*  PLT 393 342   Cardiac Enzymes: Recent Labs  Lab 02/24/18 1727 02/24/18 2256 02/25/18 0425  TROPONINI 0.12* 0.13* 0.15*   CBG: Recent Labs  Lab 02/24/18 1656 02/24/18 2156 02/25/18 0642  GLUCAP 127* 160* 136*    Iron Studies: No results for input(s): IRON, TIBC, TRANSFERRIN, FERRITIN in the last 72 hours. Studies/Results: Dg Chest Port 1 View  Result Date: 02/24/2018 CLINICAL DATA:  Chest pain EXAM: PORTABLE CHEST 1 VIEW COMPARISON:  08/24/2017 FINDINGS: The heart size and mediastinal contours are within normal limits. Both lungs are clear. The visualized skeletal structures are unremarkable. IMPRESSION: No active disease. Electronically Signed   By: Franchot Gallo M.D.   On: 02/24/2018 10:34    Medications: Infusions: . sodium chloride    . dialysis solution 1.5% low-MG/low-CA    . heparin 1,200 Units/hr (02/25/18 0604)    Scheduled Medications: . aspirin EC  325 mg Oral Daily  . calcitRIOL  0.5 mcg Oral Daily  . calcium acetate  1,334 mg Oral TID WC  . carvedilol  6.25 mg  Oral BID WC  . cinacalcet  60 mg Oral Q supper  . ferric citrate  420 mg Oral TID WC  . gentamicin cream  1 application Topical Daily  . insulin aspart  0-9 Units Subcutaneous TID WC  . insulin aspart  10 Units Subcutaneous TID WC  . insulin glargine  50 Units Subcutaneous Daily  . multivitamin  1 tablet Oral QHS  . thiamine  100 mg Oral Daily    have reviewed scheduled and prn medications.  Physical Exam: General:NAD, comfortable Heart:RRR, s1s2 nl Lungs:clear b/l, no crackle Abdomen:soft, Non-tender, non-distended Extremities:No edema Dialysis Access: PD catheter site  clean, dressing on, no tenderness  Dron Tanna Furry 02/25/2018,11:08 AM  LOS: 0 days

## 2018-02-25 NOTE — Consult Note (Addendum)
CARDIOLOGY CONSULT NOTE    Patient ID: Brenda Davis; [id number]; 02-14-1965   Admit date: 02/24/2018 Date of Consult: 02/25/2018  Primary Care Provider: [name], MD Primary Cardiologist: Skeet Latch, MD Primary Electrophysiologist:  None   Patient Profile:   Brenda Davis is a 53 y.o. female with a hx of DM, HTN, ESRD on PD who is being seen today for the evaluation of chest pain.  History of Present Illness:   Brenda Davis woke up in her normal state of health on 2/6. After coming off peritoneal dialysis she reports that she felt fatigued and tired compared to normal. She proceeded to go into work, and around 7:45 on her walk into work began to feel short of breath. When she got to her office chair and sat down this sensation abated. She then proceeded to take a couple calls and during her second call began to feel substernal chest pressure. She called over her manager who noted that she was diaphoretic. The patient subsequently felt nauseous and vomited. Her manager proceeded to call EMS who gave her aspirin and sublingual nitrous. These significantly reduced her chest pressure and she is now resting comfortably. She has had no further chest pressure or shortness of breath since admission. She has never had any episodes like this before. At baseline she is typically able to perform all her ADLs and walk without any chest pain, dyspnea, or other anginal symptoms.  She does not have a significant family history for heart disease. She is a previous smoker and states that she quit two years ago after being diagnosed with pneumonia. She denies the use of alcohol or other illicit substances. She has been on dialysis since 2016. She was initially on hemodialysis for about 1.5 to 2 years before transitioning to peritoneal dialysis. She has been a diabetic since 2002 and underwent sleeve gastrostomy. She is now off all diabetic medications.   Of note her  peritoneal dialysis was recently increased from five exchanges to six exchanges. Since doing this she has had frequent hypotension. She was brought to the emergency department earlier this month and found to be orthostatic and given some fluids. She is not taking any of her blood pressure medications because she notes that her blood pressure is consistently less than 120/80 before and after dialysis.  Denies recent fevers, chills, cough, headaches, abdominal pain, diarrhea, dysuria, myalgias, arthralgias.  Past Medical History:  Diagnosis Date  . Anemia   . Chronic kidney disease    STAGE 3 CHRONIC KIDNEY DISEASE SECONDARY TO DIABETIC GLOMERULOSCLEROSIS AND UNCONTROLLED HYPERTENSION - PER OFFICE NOTES DR. Florene [facility].  Marland Kitchen ESRD on dialysis [facility])    "MWF; [address] (05/06/2016)  . Gout   . HCAP (healthcare-associated pneumonia) 05/06/2016  . History of blood transfusion    "related to surgery"  . Hyperlipidemia   . Hypertension   . Morbid obesity ([place])   . Pain    LEFT SHOULDER PAIN - WAS SEEN AT AN URGENT CARE - GIVEN SLING FOR COMFORT AND TOLD ROM AS TOLERATED.  Marland Kitchen Palpitations 09/24/2016  . Type II diabetes mellitus ([place])    "gastric sleeve OR corrected this" (05/06/2016)   Past Surgical History:  Procedure Laterality Date  . AV FISTULA PLACEMENT Left 01/03/2015   Procedure: BRACHIAL CEPHALIC ARTERIOVENOUS  FISTULA CREATION LEFT ARM;  Surgeon: Angelia Mould, MD;  Location: [facility];  Service: Vascular;  Laterality: Left;  . CARPAL TUNNEL RELEASE Right   .  [place]  . CHOLECYSTECTOMY  09/14/2017  . ESOPHAGOGASTRODUODENOSCOPY N/A 09/04/2012   Procedure: ESOPHAGOGASTRODUODENOSCOPY (EGD);  Surgeon: Shann Medal, MD;  Location: [facility];  Service: General;  Laterality: N/A;  PF  . ESOPHAGOGASTRODUODENOSCOPY (EGD) WITH ESOPHAGEAL DILATION N/A 09/29/2012   Procedure: ESOPHAGOGASTRODUODENOSCOPY (EGD) WITH ESOPHAGEAL DILATION;  Surgeon: Milus Banister, MD;  Location: [facility];  Service: Endoscopy;  Laterality: N/A;  . INSERTION OF DIALYSIS CATHETER N/A 01/03/2015   Procedure: INSERTION OF DIALYSIS CATHETER RIGHT INTERNAL JUGULAR;  Surgeon: Angelia Mould, MD;  Location: [facility];  Service: Vascular;  Laterality: N/A;  . LAPAROSCOPIC GASTRIC SLEEVE RESECTION N/A 07/19/2012   Procedure: LAPAROSCOPIC SLEEVE GASTRECTOMY with EGD;  Surgeon: Madilyn Hook, DO;  Location: [facility];  Service: General;  Laterality: N/A;  laparoscopic sleeve gastrectomy with EGD  . PERIPHERAL VASCULAR CATHETERIZATION Left 05/23/2015   Procedure: Nolon Stalls;  Surgeon: Conrad Hernando, MD;  Location: [facility];  Service: Cardiovascular;  Laterality: Left;  upper aRM  . PERIPHERAL VASCULAR CATHETERIZATION Left 05/23/2015   Procedure: Peripheral Vascular Balloon Angioplasty;  Surgeon: Conrad Jardine, MD;  Location: [facility];  Service: Cardiovascular;  Laterality: Left;  av fistula  . TUBAL LIGATION  1993  . UPPER GI ENDOSCOPY N/A 07/19/2012   Procedure: UPPER GI ENDOSCOPY;  Surgeon: Madilyn Hook, DO;  Location: [facility];  Service: General;  Laterality: N/A;    Home Medications:  Prior to Admission medications   Medication Sig Start Date End Date Taking? Authorizing Provider  acetaminophen (TYLENOL) 325 MG tablet Take 650 mg by mouth every 6 (six) hours as needed for mild pain.   Yes [provider]  AURYXIA 1 GM 210 MG(Fe) tablet Take 210 mg by mouth 3 (three) times daily with meals.  04/07/17  Yes [provider]  calcitRIOL (ROCALTROL) 0.5 MCG capsule Take 0.5 mcg by mouth daily. 12/24/17  Yes [provider]  calcium acetate (PHOSLO) 667 MG capsule Take 2 capsules (1,334 mg total) by mouth 3 (three) times daily with meals. 01/07/15  Yes Eugenie Filler, MD  carvedilol (COREG) 6.25 MG tablet Take 1 tablet (6.25 mg total) by mouth 2 (two) times daily with a meal. 10/15/17  Yes Marrian Salvage, FNP  Cyanocobalamin (B-12  PO) Take 1 tablet by mouth daily.   Yes [provider]  gentamicin cream (GARAMYCIN) 0.1 % Apply 1 application topically See admin instructions. Apply to exit site daily as directed. 04/26/17  Yes [provider]  insulin aspart (NOVOLOG) 100 UNIT/ML FlexPen Inject 10 Units into the skin 3 (three) times daily with meals. 12/01/17  Yes [name], MD  Insulin Glargine (BASAGLAR KWIKPEN) 100 UNIT/ML SOPN Inject 0.5 mLs (50 Units total) into the skin daily. 12/01/17  Yes [name], MD  Multiple Vitamins-Minerals (PRORENAL + D) TABS Take 1 tablet by mouth daily. 10/29/17  Yes [provider]  multivitamin (RENA-VIT) TABS tablet Take 1 tablet by mouth at bedtime. 01/07/15  Yes Eugenie Filler, MD  thiamine (VITAMIN B-1) 100 MG tablet Take 1 tablet (100 mg total) by mouth daily. 06/21/17  Yes [name], MD  Insulin Pen Needle (NOVOFINE) 32G X 6 MM MISC 1 Act by Does not apply route 4 (four) times daily -  with meals and at bedtime. 11/22/17   [name], MD   Inpatient Medications: Scheduled Meds: . aspirin EC  325 mg Oral Daily  . calcitRIOL  0.5 mcg  Oral Daily  . calcium acetate  1,334 mg Oral TID WC  . carvedilol  6.25 mg Oral BID WC  . cinacalcet  60 mg Oral Q supper  . ferric citrate  420 mg Oral TID WC  . gentamicin cream  1 application Topical Daily  . insulin aspart  0-9 Units Subcutaneous TID WC  . insulin aspart  10 Units Subcutaneous TID WC  . insulin glargine  50 Units Subcutaneous Daily  . multivitamin  1 tablet Oral QHS  . thiamine  100 mg Oral Daily   Continuous Infusions: . dialysis solution 1.5% low-MG/low-CA    . heparin 1,200 Units/hr (02/25/18 0604)   PRN Meds: acetaminophen, dianeal solution for CAPD/CCPD with heparin, morphine injection, ondansetron (ZOFRAN) IV  Allergies:    Allergies  Allergen Reactions  . Amlodipine Swelling    edema  . Clonidine Derivatives Swelling    Limbs swell  . Welchol [Colesevelam Hcl]  Nausea Only   Social History:   Social History   Socioeconomic History  . Marital status: Married    Spouse name: Not on file  . Number of children: 3  . Years of education: Not on file  . Highest education level: Not on file  Occupational History  . Occupation: Research scientist (physical sciences): [employer]  Social Needs  . Financial resource strain: Not on file  . Food insecurity:    Worry: Not on file    Inability: Not on file  . Transportation needs:    Medical: Not on file    Non-medical: Not on file  Tobacco Use  . Smoking status: Former Smoker    Packs/day: 1.00    Years: 16.00    Pack years: 16.00    Types: Cigarettes    Last attempt to quit: 2009    Years since quitting: 11.1  . Smokeless tobacco: Never Used  Substance and Sexual Activity  . Alcohol use: Never    Frequency: Never  . Drug use: No  . Sexual activity: Yes    Birth control/protection: Post-menopausal  Lifestyle  . Physical activity:    Days per week: Not on file    Minutes per session: Not on file  . Stress: Not on file  Relationships  . Social connections:    Talks on phone: Not on file    Gets together: Not on file    Attends religious service: Not on file    Active member of club or organization: Not on file    Attends meetings of clubs or organizations: Not on file    Relationship status: Not on file  . Intimate partner violence:    Fear of current or ex partner: Not on file    Emotionally abused: Not on file    Physically abused: Not on file    Forced sexual activity: Not on file  Other Topics Concern  . Not on file  Social History Narrative  . Not on file    Family History:   Family History  Problem Relation Age of Onset  . Hypertension Mother   . Mitral valve prolapse Mother   . Diabetes Father   . Arthritis Other   . Hyperlipidemia Other   . Cancer Neg Hx   . Heart disease Neg Hx   . Kidney disease Neg Hx   . Stroke Neg Hx     ROS:  Performed and all others  negative.  Physical Exam/Data:   Vitals:   02/24/18 1610 02/24/18 1754 02/24/18 2200 02/25/18  0445  BP: 111/71 108/63 119/74 138/78  Pulse: 69 79 77 78  Resp: 16 16 16 16   Temp: 98.2 F (36.8 C) 98.1 F (36.7 C) 98.2 F (36.8 C) 98.1 F (36.7 C)  TempSrc: Oral Oral Oral Oral  SpO2: 100% 100% 100% 99%  Weight: 119.9 kg 120.1 kg    Height: 5' 5"  (1.651 m)       Intake/Output Summary (Last 24 hours) at 02/25/2018 0804 Last data filed at 02/24/2018 1800 Gross per 24 hour  Intake 240 ml  Output -  Net 240 ml   Filed Weights   02/24/18 1330 02/24/18 1610 02/24/18 1754  Weight: 117.9 kg 119.9 kg 120.1 kg   Body mass index is 44.06 kg/m.   General: Obese female in no acute distress HENT: Normocephalic, atraumatic, moist mucus membranes Pulm: Good air movement with no wheezing or crackles  CV: RRR, no murmurs, no rubs  Abdomen: Active bowel sounds, soft, non-distended, no tenderness to palpation  Extremities: Pulses palpable in all extremities, no LE edema  Skin: Warm and dry  Neuro: Alert and oriented x 3  EKG:  The EKG was personally reviewed and demonstrates: Sinus rhythm with normal axis and intervals. No ST segment changes or new conduction abnormalities. Stable compared to prior.  Telemetry:  Telemetry was personally reviewed and demonstrates: Sinus rhythm with occasional PVCs  Relevant CV Studies:  TTE 12/2014  - Left ventricle: The cavity size was normal. Wall thickness was   normal. Systolic function was vigorous. The estimated ejection   fraction was in the range of 65% to 70%. Features are consistent   with a pseudonormal left ventricular filling pattern, with   concomitant abnormal relaxation and increased filling pressure   (grade 2 diastolic dysfunction). - Mitral valve: There was mild regurgitation. - Pericardium, extracardiac: A trivial pericardial effusion was   identified.  Laboratory Data:  Chemistry Recent Labs  Lab 02/24/18 1027 02/25/18 0425   NA 140 139  K 3.8 3.3*  CL 95* 100  CO2 23 24  GLUCOSE 158* 141*  BUN 43* 43*  CREATININE 12.67* 12.71*  CALCIUM 9.1 8.5*  GFRNONAA 3* 3*  GFRAA 3* 3*  ANIONGAP 22* 15    Recent Labs  Lab 02/25/18 0425  ALBUMIN 2.8*   Hematology Recent Labs  Lab 02/24/18 1027 02/25/18 0425  WBC 8.2 7.7  RBC 3.85* 3.45*  HGB 12.4 11.3*  HCT 39.5 35.1*  MCV 102.6* 101.7*  MCH 32.2 32.8  MCHC 31.4 32.2  RDW 15.1 15.1  PLT 393 342   Cardiac Enzymes Recent Labs  Lab 02/24/18 1727 02/24/18 2256 02/25/18 0425  TROPONINI 0.12* 0.13* 0.15*    Recent Labs  Lab 02/24/18 1045 02/24/18 1352  TROPIPOC 0.04 0.13*    BNP Recent Labs  Lab 02/24/18 1027  BNP 36.5    DDimer No results for input(s): DDIMER in the last 168 hours.  Radiology/Studies:  Dg Chest Port 1 View  Result Date: 02/24/2018 CLINICAL DATA:  Chest pain EXAM: PORTABLE CHEST 1 VIEW COMPARISON:  08/24/2017 FINDINGS: The heart size and mediastinal contours are within normal limits. Both lungs are clear. The visualized skeletal structures are unremarkable. IMPRESSION: No active disease. Electronically Signed   By: Franchot Gallo M.D.   On: 02/24/2018 10:34   Assessment and Plan:   Brenda Davis is a 53 y.o. female with a hx of DM, HTN, ESRD on PD who is being seen today for the evaluation of typical chest pain. Troponins are elevated  slightly in the setting of ESRD.   NSTE-ACS - Troponin has remained fairly flat.  - EKG unchanged compared to prior. Telemetry with occasional PVCs  - Significant risk factors for CAD, no prior stress testing or cath - TIMI score of 3 placing her in intermediate risk.  - Would recommend continuing heparin, keep NPO, and proceeding to diagnostic cath  - Check lipid panel, will need statin therapy  - Continue ASA - Continue Carvedilol 6.25 mg BID - Will slowly start cardioprotective medications but may be limited by patient's hypotension.   For questions or updates, please  contact [place] Please consult [email or web address] for contact info under Cardiology/STEMI.   Signed, Ina Homes, MD 02/25/2018 8:04 AM   ---------------------------------------------------------------------------------------------   History and all data above reviewed.  Patient examined.  I agree with the findings as above.  Brenda Davis is currently chest pain free. She tells me she has not had significant chest pain in the past, however this episode was concerning to her and related to exertion.  On review of a previous CT abdomen there is coronary artery calcifications in the distal LAD and distal RCA on a non-gated CT study.  With her history of diabetes and hypertension contributing to ESRD, it is highly likely she has multivessel coronary artery disease.  Her chest pain and mildly elevated troponin do raise concern for obstructive coronary artery disease.  She has end-stage renal disease and the troponin has been relatively flat, so this is unlikely to represent concerning ACS.  Constitutional: No acute distress Cardiovascular: regular rhythm, normal rate, no murmurs. S1 and S2 normal. Radial pulses normal bilaterally. No jugular venous distention.  AV fistula on the left upper arm. Respiratory: clear to auscultation bilaterally GI : normal bowel sounds, soft and nontender. No distention.   MSK: extremities warm, well perfused. No edema.  NEURO: grossly nonfocal exam, moves all extremities. PSYCH: alert and oriented x 3, normal mood and affect.   All available labs, radiology testing, previous records reviewed. Agree with documented assessment and plan of my colleague as stated above with the following additions or changes:  Principal Problem:   Chest pain Active Problems:   Hyperlipidemia with target LDL less than 100   HYPERTENSION, BENIGN ESSENTIAL   ESRD on peritoneal dialysis ([place])    Plan: She has risk factors for coronary artery disease including diabetes  mellitus, hypertension, ESRD.  She presented with chest pain, and is likely high risk for obstructive coronary artery disease and multivessel disease.  It is unlikely that stress testing will change the posttest probability of disease, therefore I believe coronary artery angiography would be our next best step.  I discussed this in great detail with the patient and her husband Elberta Fortis.  I have described the risks, benefits, and alternatives to coronary angiography.  We are limited in medication titration due to hypotension, and therefore optimal medical management may also be a challenging strategy as our primary strategy.  There is no barrier to 1 year of dual antiplatelet therapy and she has no known bleeding complications.  INFORMED CONSENT: I have reviewed the risks, indications, and alternatives to cardiac catheterization, possible angioplasty, and stenting with the patient. Risks include but are not limited to bleeding, infection, vascular injury, stroke, myocardial infection, arrhythmia, kidney injury, radiation-related injury in the case of prolonged fluoroscopy use, emergency cardiac surgery, and death. The patient understands the risks of serious complication is 1-2 in 4081 with diagnostic cardiac cath and 1-2% or less with angioplasty/stenting.  I have instructed the patient that dual antiplatelet therapy should be taken for 1 year without interruption.  We have discussed the consequences of interrupted dual antiplatelet therapy and the risk for in-stent thrombosis.   Length of Stay:  LOS: 0 days   Elouise Munroe, MD [facility] 3:04 PM  02/25/2018

## 2018-02-25 NOTE — Progress Notes (Signed)
Pt alert and oriented. Pt in bed. Pt connected to peritoneal dialysis machine, cycle still in process. Tubes draining efficiently.

## 2018-02-25 NOTE — H&P (View-Only) (Signed)
CARDIOLOGY CONSULT NOTE    Patient ID: Brenda Davis; [id number]; 05/22/1965   Admit date: 02/24/2018 Date of Consult: 02/25/2018  Primary Care Provider: [name], MD Primary Cardiologist: Skeet Latch, MD Primary Electrophysiologist:  None   Patient Profile:   Brenda Davis is a 53 y.o. female with a hx of DM, HTN, ESRD on PD who is being seen today for the evaluation of chest pain.  History of Present Illness:   Brenda Davis woke up in her normal state of health on 2/6. After coming off peritoneal dialysis she reports that she felt fatigued and tired compared to normal. She proceeded to go into work, and around 7:45 on her walk into work began to feel short of breath. When she got to her office chair and sat down this sensation abated. She then proceeded to take a couple calls and during her second call began to feel substernal chest pressure. She called over her manager who noted that she was diaphoretic. The patient subsequently felt nauseous and vomited. Her manager proceeded to call EMS who gave her aspirin and sublingual nitrous. These significantly reduced her chest pressure and she is now resting comfortably. She has had no further chest pressure or shortness of breath since admission. She has never had any episodes like this before. At baseline she is typically able to perform all her ADLs and walk without any chest pain, dyspnea, or other anginal symptoms.  She does not have a significant family history for heart disease. She is a previous smoker and states that she quit two years ago after being diagnosed with pneumonia. She denies the use of alcohol or other illicit substances. She has been on dialysis since 2016. She was initially on hemodialysis for about 1.5 to 2 years before transitioning to peritoneal dialysis. She has been a diabetic since 2002 and underwent sleeve gastrostomy. She is now off all diabetic medications.   Of note her  peritoneal dialysis was recently increased from five exchanges to six exchanges. Since doing this she has had frequent hypotension. She was brought to the emergency department earlier this month and found to be orthostatic and given some fluids. She is not taking any of her blood pressure medications because she notes that her blood pressure is consistently less than 120/80 before and after dialysis.  Denies recent fevers, chills, cough, headaches, abdominal pain, diarrhea, dysuria, myalgias, arthralgias.  Past Medical History:  Diagnosis Date  . Anemia   . Chronic kidney disease    STAGE 3 CHRONIC KIDNEY DISEASE SECONDARY TO DIABETIC GLOMERULOSCLEROSIS AND UNCONTROLLED HYPERTENSION - PER OFFICE NOTES DR. [facility].  Marland Kitchen ESRD on dialysis [facility])    "MWF; [address] (05/06/2016)  . Gout   . HCAP (healthcare-associated pneumonia) 05/06/2016  . History of blood transfusion    "related to surgery"  . Hyperlipidemia   . Hypertension   . Morbid obesity ([place])   . Pain    LEFT SHOULDER PAIN - WAS SEEN AT AN URGENT CARE - GIVEN SLING FOR COMFORT AND TOLD ROM AS TOLERATED.  Marland Kitchen Palpitations 09/24/2016  . Type II diabetes mellitus ([place])    "gastric sleeve OR corrected this" (05/06/2016)   Past Surgical History:  Procedure Laterality Date  . AV FISTULA PLACEMENT Left 01/03/2015   Procedure: BRACHIAL CEPHALIC ARTERIOVENOUS  FISTULA CREATION LEFT ARM;  Surgeon: Angelia Mould, MD;  Location: [place];  Service: Vascular;  Laterality: Left;  . CARPAL TUNNEL RELEASE Right   .  [place]  . CHOLECYSTECTOMY  09/14/2017  . ESOPHAGOGASTRODUODENOSCOPY N/A 09/04/2012   Procedure: ESOPHAGOGASTRODUODENOSCOPY (EGD);  Surgeon: Shann Medal, MD;  Location: [facility];  Service: General;  Laterality: N/A;  PF  . ESOPHAGOGASTRODUODENOSCOPY (EGD) WITH ESOPHAGEAL DILATION N/A 09/29/2012   Procedure: ESOPHAGOGASTRODUODENOSCOPY (EGD) WITH ESOPHAGEAL DILATION;  Surgeon: Milus Banister, MD;  Location: [facility];  Service: Endoscopy;  Laterality: N/A;  . INSERTION OF DIALYSIS CATHETER N/A 01/03/2015   Procedure: INSERTION OF DIALYSIS CATHETER RIGHT INTERNAL JUGULAR;  Surgeon: Angelia Mould, MD;  Location: [place];  Service: Vascular;  Laterality: N/A;  . LAPAROSCOPIC GASTRIC SLEEVE RESECTION N/A 07/19/2012   Procedure: LAPAROSCOPIC SLEEVE GASTRECTOMY with EGD;  Surgeon: Madilyn Hook, DO;  Location: [facility];  Service: General;  Laterality: N/A;  laparoscopic sleeve gastrectomy with EGD  . PERIPHERAL VASCULAR CATHETERIZATION Left 05/23/2015   Procedure: Nolon Stalls;  Surgeon: Conrad Mauston, MD;  Location: [place] [facility];  Service: Cardiovascular;  Laterality: Left;  upper aRM  . PERIPHERAL VASCULAR CATHETERIZATION Left 05/23/2015   Procedure: Peripheral Vascular Balloon Angioplasty;  Surgeon: Conrad Grasston, MD;  Location: [place] [facility];  Service: Cardiovascular;  Laterality: Left;  av fistula  . TUBAL LIGATION  1993  . UPPER GI ENDOSCOPY N/A 07/19/2012   Procedure: UPPER GI ENDOSCOPY;  Surgeon: Madilyn Hook, DO;  Location: [facility];  Service: General;  Laterality: N/A;    Home Medications:  Prior to Admission medications   Medication Sig Start Date End Date Taking? Authorizing Provider  acetaminophen (TYLENOL) 325 MG tablet Take 650 mg by mouth every 6 (six) hours as needed for mild pain.   Yes [provider]  AURYXIA 1 GM 210 MG(Fe) tablet Take 210 mg by mouth 3 (three) times daily with meals.  04/07/17  Yes [provider]  calcitRIOL (ROCALTROL) 0.5 MCG capsule Take 0.5 mcg by mouth daily. 12/24/17  Yes [provider]  calcium acetate (PHOSLO) 667 MG capsule Take 2 capsules (1,334 mg total) by mouth 3 (three) times daily with meals. 01/07/15  Yes Eugenie Filler, MD  carvedilol (COREG) 6.25 MG tablet Take 1 tablet (6.25 mg total) by mouth 2 (two) times daily with a meal. 10/15/17  Yes Marrian Salvage, FNP  Cyanocobalamin (B-12  PO) Take 1 tablet by mouth daily.   Yes [provider]  gentamicin cream (GARAMYCIN) 0.1 % Apply 1 application topically See admin instructions. Apply to exit site daily as directed. 04/26/17  Yes [provider]  insulin aspart (NOVOLOG) 100 UNIT/ML FlexPen Inject 10 Units into the skin 3 (three) times daily with meals. 12/01/17  Yes [name], MD  Insulin Glargine (BASAGLAR KWIKPEN) 100 UNIT/ML SOPN Inject 0.5 mLs (50 Units total) into the skin daily. 12/01/17  Yes [name], MD  Multiple Vitamins-Minerals (PRORENAL + D) TABS Take 1 tablet by mouth daily. 10/29/17  Yes [provider]  multivitamin (RENA-VIT) TABS tablet Take 1 tablet by mouth at bedtime. 01/07/15  Yes Eugenie Filler, MD  thiamine (VITAMIN B-1) 100 MG tablet Take 1 tablet (100 mg total) by mouth daily. 06/21/17  Yes [name], MD  Insulin Pen Needle (NOVOFINE) 32G X 6 MM MISC 1 Act by Does not apply route 4 (four) times daily -  with meals and at bedtime. 11/22/17   [name], MD   Inpatient Medications: Scheduled Meds: . aspirin EC  325 mg Oral Daily  . calcitRIOL  0.5 mcg  Oral Daily  . calcium acetate  1,334 mg Oral TID WC  . carvedilol  6.25 mg Oral BID WC  . cinacalcet  60 mg Oral Q supper  . ferric citrate  420 mg Oral TID WC  . gentamicin cream  1 application Topical Daily  . insulin aspart  0-9 Units Subcutaneous TID WC  . insulin aspart  10 Units Subcutaneous TID WC  . insulin glargine  50 Units Subcutaneous Daily  . multivitamin  1 tablet Oral QHS  . thiamine  100 mg Oral Daily   Continuous Infusions: . dialysis solution 1.5% low-MG/low-CA    . heparin 1,200 Units/hr (02/25/18 0604)   PRN Meds: acetaminophen, dianeal solution for CAPD/CCPD with heparin, morphine injection, ondansetron (ZOFRAN) IV  Allergies:    Allergies  Allergen Reactions  . Amlodipine Swelling    edema  . Clonidine Derivatives Swelling    Limbs swell  . Welchol [Colesevelam Hcl]  Nausea Only   Social History:   Social History   Socioeconomic History  . Marital status: Married    Spouse name: Not on file  . Number of children: 3  . Years of education: Not on file  . Highest education level: Not on file  Occupational History  . Occupation: Research scientist (physical sciences): [employer]  Social Needs  . Financial resource strain: Not on file  . Food insecurity:    Worry: Not on file    Inability: Not on file  . Transportation needs:    Medical: Not on file    Non-medical: Not on file  Tobacco Use  . Smoking status: Former Smoker    Packs/day: 1.00    Years: 16.00    Pack years: 16.00    Types: Cigarettes    Last attempt to quit: 2009    Years since quitting: 11.1  . Smokeless tobacco: Never Used  Substance and Sexual Activity  . Alcohol use: Never    Frequency: Never  . Drug use: No  . Sexual activity: Yes    Birth control/protection: Post-menopausal  Lifestyle  . Physical activity:    Days per week: Not on file    Minutes per session: Not on file  . Stress: Not on file  Relationships  . Social connections:    Talks on phone: Not on file    Gets together: Not on file    Attends religious service: Not on file    Active member of club or organization: Not on file    Attends meetings of clubs or organizations: Not on file    Relationship status: Not on file  . Intimate partner violence:    Fear of current or ex partner: Not on file    Emotionally abused: Not on file    Physically abused: Not on file    Forced sexual activity: Not on file  Other Topics Concern  . Not on file  Social History Narrative  . Not on file    Family History:   Family History  Problem Relation Age of Onset  . Hypertension Mother   . Mitral valve prolapse Mother   . Diabetes Father   . Arthritis Other   . Hyperlipidemia Other   . Cancer Neg Hx   . Heart disease Neg Hx   . Kidney disease Neg Hx   . Stroke Neg Hx     ROS:  Performed and all others  negative.  Physical Exam/Data:   Vitals:   02/24/18 1610 02/24/18 1754 02/24/18 2200 02/25/18  0445  BP: 111/71 108/63 119/74 138/78  Pulse: 69 79 77 78  Resp: 16 16 16 16   Temp: 98.2 F (36.8 C) 98.1 F (36.7 C) 98.2 F (36.8 C) 98.1 F (36.7 C)  TempSrc: Oral Oral Oral Oral  SpO2: 100% 100% 100% 99%  Weight: 119.9 kg 120.1 kg    Height: 5' 5"  (1.651 m)       Intake/Output Summary (Last 24 hours) at 02/25/2018 0804 Last data filed at 02/24/2018 1800 Gross per 24 hour  Intake 240 ml  Output -  Net 240 ml   Filed Weights   02/24/18 1330 02/24/18 1610 02/24/18 1754  Weight: 117.9 kg 119.9 kg 120.1 kg   Body mass index is 44.06 kg/m.   General: Obese female in no acute distress HENT: Normocephalic, atraumatic, moist mucus membranes Pulm: Good air movement with no wheezing or crackles  CV: RRR, no murmurs, no rubs  Abdomen: Active bowel sounds, soft, non-distended, no tenderness to palpation  Extremities: Pulses palpable in all extremities, no LE edema  Skin: Warm and dry  Neuro: Alert and oriented x 3  EKG:  The EKG was personally reviewed and demonstrates: Sinus rhythm with normal axis and intervals. No ST segment changes or new conduction abnormalities. Stable compared to prior.  Telemetry:  Telemetry was personally reviewed and demonstrates: Sinus rhythm with occasional PVCs  Relevant CV Studies:  TTE 12/2014  - Left ventricle: The cavity size was normal. Wall thickness was   normal. Systolic function was vigorous. The estimated ejection   fraction was in the range of 65% to 70%. Features are consistent   with a pseudonormal left ventricular filling pattern, with   concomitant abnormal relaxation and increased filling pressure   (grade 2 diastolic dysfunction). - Mitral valve: There was mild regurgitation. - Pericardium, extracardiac: A trivial pericardial effusion was   identified.  Laboratory Data:  Chemistry Recent Labs  Lab 02/24/18 1027 02/25/18 0425   NA 140 139  K 3.8 3.3*  CL 95* 100  CO2 23 24  GLUCOSE 158* 141*  BUN 43* 43*  CREATININE 12.67* 12.71*  CALCIUM 9.1 8.5*  GFRNONAA 3* 3*  GFRAA 3* 3*  ANIONGAP 22* 15    Recent Labs  Lab 02/25/18 0425  ALBUMIN 2.8*   Hematology Recent Labs  Lab 02/24/18 1027 02/25/18 0425  WBC 8.2 7.7  RBC 3.85* 3.45*  HGB 12.4 11.3*  HCT 39.5 35.1*  MCV 102.6* 101.7*  MCH 32.2 32.8  MCHC 31.4 32.2  RDW 15.1 15.1  PLT 393 342   Cardiac Enzymes Recent Labs  Lab 02/24/18 1727 02/24/18 2256 02/25/18 0425  TROPONINI 0.12* 0.13* 0.15*    Recent Labs  Lab 02/24/18 1045 02/24/18 1352  TROPIPOC 0.04 0.13*    BNP Recent Labs  Lab 02/24/18 1027  BNP 36.5    DDimer No results for input(s): DDIMER in the last 168 hours.  Radiology/Studies:  Dg Chest Davis 1 View  Result Date: 02/24/2018 CLINICAL DATA:  Chest pain EXAM: PORTABLE CHEST 1 VIEW COMPARISON:  08/24/2017 FINDINGS: The heart size and mediastinal contours are within normal limits. Both lungs are clear. The visualized skeletal structures are unremarkable. IMPRESSION: No active disease. Electronically Signed   By: Franchot Gallo M.D.   On: 02/24/2018 10:34   Assessment and Plan:   Brenda Davis is a 53 y.o. female with a hx of DM, HTN, ESRD on PD who is being seen today for the evaluation of typical chest pain. Troponins are elevated  slightly in the setting of ESRD.   NSTE-ACS - Troponin has remained fairly flat.  - EKG unchanged compared to prior. Telemetry with occasional PVCs  - Significant risk factors for CAD, no prior stress testing or cath - TIMI score of 3 placing her in intermediate risk.  - Would recommend continuing heparin, keep NPO, and proceeding to diagnostic cath  - Check lipid panel, will need statin therapy  - Continue ASA - Continue Carvedilol 6.25 mg BID - Will slowly start cardioprotective medications but may be limited by patient's hypotension.   For questions or updates, please  contact [place] Please consult [email or web address] for contact info under Cardiology/STEMI.   Signed, Ina Homes, MD 02/25/2018 8:04 AM   ---------------------------------------------------------------------------------------------   History and all data above reviewed.  Patient examined.  I agree with the findings as above.  Brenda Davis is currently chest pain free. She tells me she has not had significant chest pain in the past, however this episode was concerning to her and related to exertion.  On review of a previous CT abdomen there is coronary artery calcifications in the distal LAD and distal RCA on a non-gated CT study.  With her history of diabetes and hypertension contributing to ESRD, it is highly likely she has multivessel coronary artery disease.  Her chest pain and mildly elevated troponin do raise concern for obstructive coronary artery disease.  She has end-stage renal disease and the troponin has been relatively flat, so this is unlikely to represent concerning ACS.  Constitutional: No acute distress Cardiovascular: regular rhythm, normal rate, no murmurs. S1 and S2 normal. Radial pulses normal bilaterally. No jugular venous distention.  AV fistula on the left upper arm. Respiratory: clear to auscultation bilaterally GI : normal bowel sounds, soft and nontender. No distention.   MSK: extremities warm, well perfused. No edema.  NEURO: grossly nonfocal exam, moves all extremities. PSYCH: alert and oriented x 3, normal mood and affect.   All available labs, radiology testing, previous records reviewed. Agree with documented assessment and plan of my colleague as stated above with the following additions or changes:  Principal Problem:   Chest pain Active Problems:   Hyperlipidemia with target LDL less than 100   HYPERTENSION, BENIGN ESSENTIAL   ESRD on peritoneal dialysis ([place])    Plan: She has risk factors for coronary artery disease including diabetes  mellitus, hypertension, ESRD.  She presented with chest pain, and is likely high risk for obstructive coronary artery disease and multivessel disease.  It is unlikely that stress testing will change the posttest probability of disease, therefore I believe coronary artery angiography would be our next best step.  I discussed this in great detail with the patient and her husband Elberta Fortis.  I have described the risks, benefits, and alternatives to coronary angiography.  We are limited in medication titration due to hypotension, and therefore optimal medical management may also be a challenging strategy as our primary strategy.  There is no barrier to 1 year of dual antiplatelet therapy and she has no known bleeding complications.  INFORMED CONSENT: I have reviewed the risks, indications, and alternatives to cardiac catheterization, possible angioplasty, and stenting with the patient. Risks include but are not limited to bleeding, infection, vascular injury, stroke, myocardial infection, arrhythmia, kidney injury, radiation-related injury in the case of prolonged fluoroscopy use, emergency cardiac surgery, and death. The patient understands the risks of serious complication is 1-2 in 7124 with diagnostic cardiac cath and 1-2% or less with angioplasty/stenting.  I have instructed the patient that dual antiplatelet therapy should be taken for 1 year without interruption.  We have discussed the consequences of interrupted dual antiplatelet therapy and the risk for in-stent thrombosis.   Length of Stay:  LOS: 0 days   Elouise Munroe, MD [facility] 3:04 PM  02/25/2018

## 2018-02-26 DIAGNOSIS — E785 Hyperlipidemia, unspecified: Secondary | ICD-10-CM | POA: Diagnosis not present

## 2018-02-26 DIAGNOSIS — I209 Angina pectoris, unspecified: Secondary | ICD-10-CM

## 2018-02-26 DIAGNOSIS — D638 Anemia in other chronic diseases classified elsewhere: Secondary | ICD-10-CM | POA: Diagnosis not present

## 2018-02-26 DIAGNOSIS — I120 Hypertensive chronic kidney disease with stage 5 chronic kidney disease or end stage renal disease: Secondary | ICD-10-CM | POA: Diagnosis not present

## 2018-02-26 DIAGNOSIS — I25119 Atherosclerotic heart disease of native coronary artery with unspecified angina pectoris: Secondary | ICD-10-CM | POA: Diagnosis not present

## 2018-02-26 DIAGNOSIS — R079 Chest pain, unspecified: Secondary | ICD-10-CM | POA: Diagnosis not present

## 2018-02-26 DIAGNOSIS — I214 Non-ST elevation (NSTEMI) myocardial infarction: Secondary | ICD-10-CM | POA: Diagnosis not present

## 2018-02-26 LAB — CBC
HCT: 34.8 % — ABNORMAL LOW (ref 36.0–46.0)
Hemoglobin: 11.4 g/dL — ABNORMAL LOW (ref 12.0–15.0)
MCH: 33 pg (ref 26.0–34.0)
MCHC: 32.8 g/dL (ref 30.0–36.0)
MCV: 100.9 fL — ABNORMAL HIGH (ref 80.0–100.0)
Platelets: 344 10*3/uL (ref 150–400)
RBC: 3.45 MIL/uL — ABNORMAL LOW (ref 3.87–5.11)
RDW: 15.1 % (ref 11.5–15.5)
WBC: 6.5 10*3/uL (ref 4.0–10.5)
nRBC: 0 % (ref 0.0–0.2)

## 2018-02-26 LAB — RENAL FUNCTION PANEL
Albumin: 2.8 g/dL — ABNORMAL LOW (ref 3.5–5.0)
Anion gap: 16 — ABNORMAL HIGH (ref 5–15)
BUN: 40 mg/dL — ABNORMAL HIGH (ref 6–20)
CO2: 22 mmol/L (ref 22–32)
Calcium: 8.1 mg/dL — ABNORMAL LOW (ref 8.9–10.3)
Chloride: 98 mmol/L (ref 98–111)
Creatinine, Ser: 12.51 mg/dL — ABNORMAL HIGH (ref 0.44–1.00)
GFR calc Af Amer: 4 mL/min — ABNORMAL LOW (ref >60)
GFR calc non Af Amer: 3 mL/min — ABNORMAL LOW (ref >60)
Glucose, Bld: 117 mg/dL — ABNORMAL HIGH (ref 70–99)
Phosphorus: 7.3 mg/dL — ABNORMAL HIGH (ref 2.5–4.6)
Potassium: 3.4 mmol/L — ABNORMAL LOW (ref 3.5–5.1)
Sodium: 136 mmol/L (ref 135–145)

## 2018-02-26 LAB — GLUCOSE, CAPILLARY: Glucose-Capillary: 115 mg/dL — ABNORMAL HIGH (ref 70–99)

## 2018-02-26 LAB — LIPID PANEL
Cholesterol: 225 mg/dL — ABNORMAL HIGH (ref 0–200)
HDL: 38 mg/dL — ABNORMAL LOW (ref >40)
LDL Cholesterol: 150 mg/dL — ABNORMAL HIGH (ref 0–99)
Total CHOL/HDL Ratio: 5.9 RATIO
Triglycerides: 186 mg/dL — ABNORMAL HIGH (ref <150)
VLDL: 37 mg/dL (ref 0–40)

## 2018-02-26 MED ORDER — ATORVASTATIN CALCIUM 80 MG PO TABS: 80.0000 mg | ORAL_TABLET | Freq: Every day | ORAL | Status: DC

## 2018-02-26 MED ORDER — HEPARIN SODIUM (PORCINE) 5000 UNIT/ML IJ SOLN
5000.0000 [IU] | Freq: Three times a day (TID) | INTRAMUSCULAR | Status: DC
  Administered 2018-02-26: 06:00:00 5000 [IU] via SUBCUTANEOUS
  Filled 2018-02-26: qty 1

## 2018-02-26 MED ORDER — ATORVASTATIN CALCIUM 40 MG PO TABS: 40.0000 mg | ORAL_TABLET | Freq: Every day | ORAL | Status: DC

## 2018-02-26 MED ORDER — ATORVASTATIN CALCIUM 80 MG PO TABS: 80.0000 mg | ORAL_TABLET | Freq: Every day | ORAL | 0 refills | Status: DC

## 2018-02-26 MED ORDER — CARVEDILOL 3.125 MG PO TABS: 3.1250 mg | ORAL_TABLET | Freq: Two times a day (BID) | ORAL | Status: DC

## 2018-02-26 MED ORDER — ASPIRIN EC 81 MG PO TBEC
81.0000 mg | DELAYED_RELEASE_TABLET | Freq: Every day | ORAL | Status: DC
  Administered 2018-02-26: 10:00:00 81 mg via ORAL
  Filled 2018-02-26: qty 1

## 2018-02-26 MED ORDER — CARVEDILOL 3.125 MG PO TABS: 3.1250 mg | ORAL_TABLET | Freq: Two times a day (BID) | ORAL | 0 refills | Status: DC

## 2018-02-26 MED ORDER — ASPIRIN 81 MG PO TBEC: 81.0000 mg | DELAYED_RELEASE_TABLET | Freq: Every day | ORAL | 0 refills | Status: DC

## 2018-02-26 MED ORDER — CINACALCET HCL 30 MG PO TABS: 60.0000 mg | ORAL_TABLET | Freq: Every day | ORAL | 0 refills | Status: DC

## 2018-02-26 NOTE — Discharge Summary (Signed)
Brenda Davis, is a 53 y.o. female  DOB [birth year]  MRN [id number].  Admission date:  02/24/2018  Admitting Physician  Barb Merino, MD  Discharge Date:  02/26/2018   Primary MD  [name], MD  Recommendations for primary care physician for things to follow:     Discharge Diagnosis   Principal Problem:   NSTEMI (non-ST elevated myocardial infarction) [facility]) Active Problems:   Hyperlipidemia with target LDL less than 100   HYPERTENSION, BENIGN ESSENTIAL   Chest pain   Anemia of chronic disease   ESRD on peritoneal dialysis [facility])   Coronary artery disease involving native coronary artery of native heart with angina pectoris [facility])      Past Medical History:  Diagnosis Date  . Anemia   . Chronic kidney disease    STAGE 3 CHRONIC KIDNEY DISEASE SECONDARY TO DIABETIC GLOMERULOSCLEROSIS AND UNCONTROLLED HYPERTENSION - PER OFFICE NOTES DR. Florene Glen [facility].  Marland Kitchen ESRD on dialysis [facility])    "MWF; [address] (05/06/2016)  . Gout   . HCAP (healthcare-associated pneumonia) 05/06/2016  . History of blood transfusion    "related to surgery"  . Hyperlipidemia   . Hypertension   . Morbid obesity ([place])   . Pain    LEFT SHOULDER PAIN - WAS SEEN AT AN URGENT CARE - GIVEN SLING FOR COMFORT AND TOLD ROM AS TOLERATED.  Marland Kitchen Palpitations 09/24/2016  . Type II diabetes mellitus ([place])    "gastric sleeve OR corrected this" (05/06/2016)    Past Surgical History:  Procedure Laterality Date  . AV FISTULA PLACEMENT Left 01/03/2015   Procedure: BRACHIAL CEPHALIC ARTERIOVENOUS  FISTULA CREATION LEFT ARM;  Surgeon: Angelia Mould, MD;  Location: [place];  Service: Vascular;  Laterality: Left;  . CARPAL TUNNEL RELEASE Right   . [place]  . CHOLECYSTECTOMY  09/14/2017  .  ESOPHAGOGASTRODUODENOSCOPY N/A 09/04/2012   Procedure: ESOPHAGOGASTRODUODENOSCOPY (EGD);  Surgeon: Shann Medal, MD;  Location: [facility];  Service: General;  Laterality: N/A;  PF  . ESOPHAGOGASTRODUODENOSCOPY (EGD) WITH ESOPHAGEAL DILATION N/A 09/29/2012   Procedure: ESOPHAGOGASTRODUODENOSCOPY (EGD) WITH ESOPHAGEAL DILATION;  Surgeon: Milus Banister, MD;  Location: [facility];  Service: Endoscopy;  Laterality: N/A;  . INSERTION OF DIALYSIS CATHETER N/A 01/03/2015   Procedure: INSERTION OF DIALYSIS CATHETER RIGHT INTERNAL JUGULAR;  Surgeon: Angelia Mould, MD;  Location: [place];  Service: Vascular;  Laterality: N/A;  . LAPAROSCOPIC GASTRIC SLEEVE RESECTION N/A 07/19/2012   Procedure: LAPAROSCOPIC SLEEVE GASTRECTOMY with EGD;  Surgeon: Madilyn Hook, DO;  Location: [facility];  Service: General;  Laterality: N/A;  laparoscopic sleeve gastrectomy with EGD  . PERIPHERAL VASCULAR CATHETERIZATION Left 05/23/2015   Procedure: Nolon Stalls;  Surgeon: Conrad Timber Lake, MD;  Location: [place] [facility];  Service: Cardiovascular;  Laterality: Left;  upper aRM  . PERIPHERAL VASCULAR CATHETERIZATION Left 05/23/2015   Procedure: Peripheral Vascular Balloon Angioplasty;  Surgeon: Conrad Woodward, MD;  Location: [place] [facility];  Service: Cardiovascular;  Laterality: Left;  av  fistula  . TUBAL LIGATION  1993  . UPPER GI ENDOSCOPY N/A 07/19/2012   Procedure: UPPER GI ENDOSCOPY;  Surgeon: Madilyn Hook, DO;  Location: [facility];  Service: General;  Laterality: N/A;       HPI  from the history and physical done on the day of admission:   Brenda Davis is a 53 y.o. female with medical history significant of ESRD on peritoneal dialysis, hypertension, diabetes presents to the emergency room with 1 episode of left precordial chest pain and nausea.  According to the patient, she recently had change in her dialysis exchanges from 5-6.  She was feeling little bit dry since yesterday.  Today morning, she woke up she  was having some nausea.  Went to work.  When she was at work, she suddenly felt sharp, left precordial, moderate chest pain and some shortness of breath.  No radiation of the pain.  Pain persisted for about 1 hour and improved after EMS gave her a full dose of aspirin and nitroglycerin.  Her chest pain has improved since then. Denies any headache, dizziness, syncopal episode.  Denies any palpitations.  No history of coronary artery disease.  She is on peritoneal dialysis.  Denies any recent flulike symptoms cough cold.  No sick contacts.  No recent travel.  ED Course: Hemodynamically stable.  On room air.  Chest pain-free.  Initial troponin were 0.03.  Subsequent troponin is 0.13.  Twelve-lead EKG shows normal sinus rhythm but no acute ST-T wave changes.  ER physician discussed with cardiology.  Patient is started on heparin infusion.   Hospital Course:  1. NSTEMI, coronary artery disease: Patient presented with complaints of chest pain relieved with nitroglycerin.  EKG was unchanged.  Troponins positive up to 0.15.  Patient had been placed on a heparin drip.  Cardiology consulted and plan for catheterization.  Patient underwent cardiac catheterization on 2/7 with Dr. Ellyn Hack revealing moderate three-vessel disease with extensive calcification in all 3 major coronary arteries, but no stenting was warranted.  Patient was continued on 81 mg aspirin, atorvastatin 80 mg, and Coreg.  Patient to be set up with follow-up with Dr. Skeet Latch MD in the outpatient setting  2.  ESRD on peritoneal dialysis: Follows with Dr. Marval Regal in the outpatient setting. Patient was continued treated with calcitriol 0.5 mcg daily, Sensipar 60 mg daily, a mixture of PhosLo and Auryxia for phosphate binding.  Nephrology arranged for daily peritoneal dialysis while she was hospitalized.    3.  Essential hypertension: Systolic blood pressures prior to discharge noted to be in the upper 90s. patient reported recent issues  with hypotension while on Coreg 6.25 mg twice daily for which she had discontinued its use 6 weeks ago.  Cardiology recommended continuation of the beta-blocker given hyperdynamic left ventricle.  She was restarted on Coreg 6.25 mg initially, but this was decreased to 3.125 mg.  Will need outpatient follow-up of blood pressures to ensure patient not continued to have hypotension.  4. Hyperlipidemia: Last lipid panel noted LDL of 126 on 08/2017.  Goal LDL less than 70.  Patient currently has not been on a statin.  She was started on atorvastatin 80 mg during her hospital stay.  Will need repeat liver function studies in 4 - 6 weeks.  5.  Morbid obesity: Body mass index is 44.06 kg/m.  Discussed healthy eating habits and need of weight loss.   6.  Anemia of chronic disease: Stable.  Hemoglobin prior to discharge noted to be 11.4.  Follow UP  Follow-up Information    [name], MD. Schedule an appointment as soon as possible for a visit in 1 week(s).   Specialty:  Internal Medicine Contact information: [address] [address] [phone]        Skeet Latch, MD. Schedule an appointment as soon as possible for a visit.   Specialty:  Cardiology Why:  Call and make appointment for follow-up in 1- 2 weeks Contact information: [place] [address] [phone]            Consults obtained: Nephrology and [facility] cardiology  Discharge Condition: Stable  Diet and Activity recommendation: See Discharge Instructions below  Discharge Instructions    Diet - low sodium heart healthy   Complete by:  As directed    Discharge instructions   Complete by:  As directed    Follow with Primary MD [name], MD in 7 days.  You were valuated for chest pain, and the cardiac catheterization revealed moderate 3 vessel disease that was not amendable to stenting.  Cardiology would like you to take an aspirin daily, retry taking  carvedilol, and utilize atorvastatin.  You will need to call the cardiology office with the numbers seen below to set up an appointment.  You were also started on Sensipar during this hospitalization by nephrology.   Get CBC, renal function panel, and recheck of blood pressure-  checked  by Primary MD or SNF MD in 5-7 days ( we routinely change or add medications that can affect your baseline labs and fluid status, therefore we recommend that you get the mentioned basic workup next visit with your PCP, your PCP may decide not to get them or add new tests based on their clinical decision)  Activity: As tolerated   Disposition:Home  Diet: renal and carbohydrate modified with fluid restriction   Special Instructions: If you have smoked or chewed Tobacco  in the last 2 yrs please stop smoking, stop any regular Alcohol  and or any Recreational drug use.  On your next visit with your primary care physician please Get Medicines reviewed and adjusted.  Please request your [name], MD to go over all Hospital Tests and Procedure/Radiological results at the follow up, please get all Hospital records sent to your Prim MD by signing hospital release before you go home.  If you experience worsening of your admission symptoms, develop shortness of breath, life threatening emergency, suicidal or homicidal thoughts you must seek medical attention immediately by calling 911 or calling your MD immediately  if symptoms less severe.  You Must read complete instructions/literature along with all the possible adverse reactions/side effects for all the Medicines you take and that have been prescribed to you. Take any new Medicines after you have completely understood and accpet all the possible adverse reactions/side effects.   Do not drive, operate heavy machinery, perform activities at heights, swimming or participation in water activities or provide baby sitting services if your were admitted for syncope or  siezures until you have seen by Primary MD or a Neurologist and advised to do so again.  Do not drive when taking Pain medications.  Do not take more than prescribed Pain, Sleep and Anxiety Medications  Wear Seat belts while driving.   Please note  You were cared for by a hospitalist during your hospital stay. If you have any questions about your discharge medications or the care you received while you were  in the hospital after you are discharged, you can call the unit and asked to speak with the hospitalist on call if the hospitalist that took care of you is not available. Once you are discharged, your primary care physician will handle any further medical issues. Please note that NO REFILLS for any discharge medications will be authorized once you are discharged, as it is imperative that you return to your primary care physician (or establish a relationship with a primary care physician if you do not have one) for your aftercare needs so that they can reassess your need for medications and monitor your lab values.   Increase activity slowly   Complete by:  As directed         Discharge Medications     Allergies as of 02/26/2018      Reactions   Amlodipine Swelling   edema   Clonidine Derivatives Swelling   Limbs swell   Welchol [colesevelam Hcl] Nausea Only      Medication List    TAKE these medications   acetaminophen 325 MG tablet Commonly known as:  TYLENOL Take 650 mg by mouth every 6 (six) hours as needed for mild pain.   aspirin 81 MG EC tablet Take 1 tablet (81 mg total) by mouth daily.   atorvastatin 80 MG tablet Commonly known as:  LIPITOR Take 1 tablet (80 mg total) by mouth daily at 6 PM.   AURYXIA 1 GM 210 MG(Fe) tablet Generic drug:  ferric citrate Take 210 mg by mouth 3 (three) times daily with meals.   B-12 PO Take 1 tablet by mouth daily.   BASAGLAR KWIKPEN 100 UNIT/ML Sopn Inject 0.5 mLs (50 Units total) into the skin daily.   calcitRIOL 0.5 MCG  capsule Commonly known as:  ROCALTROL Take 0.5 mcg by mouth daily.   calcium acetate 667 MG capsule Commonly known as:  PHOSLO Take 2 capsules (1,334 mg total) by mouth 3 (three) times daily with meals.   carvedilol 3.125 MG tablet Commonly known as:  COREG Take 1 tablet (3.125 mg total) by mouth 2 (two) times daily with a meal. What changed:    medication strength  how much to take   cinacalcet 30 MG tablet Commonly known as:  SENSIPAR Take 2 tablets (60 mg total) by mouth daily with supper.   gentamicin cream 0.1 % Commonly known as:  GARAMYCIN Apply 1 application topically See admin instructions. Apply to exit site daily as directed.   insulin aspart 100 UNIT/ML FlexPen Commonly known as:  NOVOLOG Inject 10 Units into the skin 3 (three) times daily with meals.   Insulin Pen Needle 32G X 6 MM Misc Commonly known as:  NOVOFINE 1 Act by Does not apply route 4 (four) times daily -  with meals and at bedtime.   multivitamin Tabs tablet Take 1 tablet by mouth at bedtime.   PRORENAL + D Tabs Take 1 tablet by mouth daily.   thiamine 100 MG tablet Commonly known as:  VITAMIN B-1 Take 1 tablet (100 mg total) by mouth daily.       Major procedures and Radiology Reports - PLEASE review detailed and final reports for all details, in brief -   Cardiac catheterization on 02/25/2018: Postop diagnosis Moderate three-vessel disease with extensive calcification of all 3 major coronary arteries: Proximal RCA 60 to 65% DFR negative, ostial OM1 40% followed by 50% mid OM1, diffuse 40 to 50% calcified mid LAD. No obvious culprit lesion with negative DFR on, the  RCA lesion. Normal LVEDP. Hyperdynamic LV with EF greater than 65%.   Dg Chest Port 1 View  Result Date: 02/24/2018 CLINICAL DATA:  Chest pain EXAM: PORTABLE CHEST 1 VIEW COMPARISON:  08/24/2017 FINDINGS: The heart size and mediastinal contours are within normal limits. Both lungs are clear. The visualized skeletal structures are  unremarkable. IMPRESSION: No active disease. Electronically Signed   By: Franchot Gallo M.D.   On: 02/24/2018 10:34    Micro Results    No results found for this or any previous visit (from the past 240 hour(s)).     Today   Subjective    Brenda Davis reported some mild stinging at the catheterization site that resolved with Tylenol.  Denies any complaints of chest pain.  Objective   Blood pressure 99/65, pulse 70, temperature 98.2 F (36.8 C), temperature source Oral, resp. rate 17, height 5\' 5"  (1.651 m), weight 121.7 kg, SpO2 100 %.   Intake/Output Summary (Last 24 hours) at 02/26/2018 0944 Last data filed at 02/25/2018 1947 Gross per 24 hour  Intake 300 ml  Output -  Net 300 ml    Exam  Constitutional: Obese female in NAD, calm, comfortable Eyes: PERRL, lids and conjunctivae normal ENMT: Mucous membranes are moist. Posterior pharynx clear of any exudate or lesions.  Neck: normal, supple, no masses, no thyromegaly Respiratory: clear to auscultation bilaterally, no wheezing, no crackles. Normal respiratory effort. No accessory muscle use.  Cardiovascular: Regular rate and rhythm, no murmurs / rubs / gallops. No extremity edema. 2+ pedal pulses.  Left upper extremity fistula. Abdomen: no tenderness, no masses palpated. No hepatosplenomegaly. Bowel sounds positive.  Musculoskeletal: no clubbing / cyanosis. No joint deformity upper and lower extremities. Good ROM, no contractures. Normal muscle tone.  Skin: no rashes, lesions, ulcers. No induration Neurologic: CN 2-12 grossly intact. Sensation intact, DTR normal. Strength 5/5 in all 4.  Psychiatric: Normal judgment and insight. Alert and oriented x 3. Normal mood.    Data Review   CBC w Diff:  Lab Results  Component Value Date   WBC 6.5 02/26/2018   HGB 11.4 (L) 02/26/2018   HCT 34.8 (L) 02/26/2018   PLT 344 02/26/2018   LYMPHOPCT 19 02/03/2018   MONOPCT 10 02/03/2018   EOSPCT 3 02/03/2018   BASOPCT 1  02/03/2018    CMP:  Lab Results  Component Value Date   NA 136 02/26/2018   NA 140 09/24/2016   K 3.4 (L) 02/26/2018   CL 98 02/26/2018   CO2 22 02/26/2018   BUN 40 (H) 02/26/2018   BUN 26 (H) 09/24/2016   CREATININE 12.51 (H) 02/26/2018   CREATININE 2.28 (H) 03/21/2012   PROT 7.1 10/28/2017   PROT 7.2 09/24/2016   ALBUMIN 2.8 (L) 02/26/2018   ALBUMIN 4.5 09/24/2016   BILITOT 0.4 10/28/2017   BILITOT 0.3 09/24/2016   ALKPHOS 116 10/28/2017   AST 13 10/28/2017   ALT 16 10/28/2017  .   Total Time in preparing paper work, data evaluation and todays exam - 35 minutes  Norval Morton M.D on 02/26/2018 at 9:44 AM  [facility]   Office  [phone]

## 2018-02-26 NOTE — Progress Notes (Signed)
Progress Note  Patient Name: Brenda Davis Date of Encounter: 02/26/2018  Primary Cardiologist: No primary care provider on file.   Subjective   Feels well, no concerns.   Inpatient Medications    Scheduled Meds: . aspirin EC  81 mg Oral Daily  . atorvastatin  80 mg Oral q1800  . calcitRIOL  0.5 mcg Oral Daily  . calcium acetate  1,334 mg Oral TID WC  . carvedilol  3.125 mg Oral BID WC  . cinacalcet  60 mg Oral Q supper  . ferric citrate  420 mg Oral TID WC  . gentamicin cream  1 application Topical Daily  . heparin injection (subcutaneous)  5,000 Units Subcutaneous Q8H  . insulin aspart  0-9 Units Subcutaneous TID WC  . insulin aspart  10 Units Subcutaneous TID WC  . insulin glargine  50 Units Subcutaneous Daily  . multivitamin  1 tablet Oral QHS  . potassium chloride  20 mEq Oral Once  . sodium chloride flush  3 mL Intravenous Q12H  . thiamine  100 mg Oral Daily   Continuous Infusions: . sodium chloride    . dialysis solution 1.5% low-MG/low-CA     PRN Meds: sodium chloride, acetaminophen, dianeal solution for CAPD/CCPD with heparin, morphine injection, ondansetron (ZOFRAN) IV, sodium chloride flush   Vital Signs    Vitals:   02/25/18 2227 02/26/18 0427 02/26/18 0602 02/26/18 0726  BP: (!) 113/59   (!) 100/53  Pulse:   68 70  Resp: 14  15 13   Temp:  97.9 F (36.6 C)  98.2 F (36.8 C)  TempSrc:  Oral  Oral  SpO2:   100% 100%  Weight:  121.7 kg    Height:        Intake/Output Summary (Last 24 hours) at 02/26/2018 0910 Last data filed at 02/25/2018 1947 Gross per 24 hour  Intake 300 ml  Output -  Net 300 ml   Filed Weights   02/24/18 1754 02/25/18 1755 02/26/18 0427  Weight: 120.1 kg 120.6 kg 121.7 kg    Telemetry    NSR - Personally Reviewed  ECG    NSR - Personally Reviewed  Physical Exam   GEN: No acute distress.   Neck: No JVD Cardiac: regular rhythm, normal rate, no murmurs, rubs, or gallops.  Respiratory: Clear to  auscultation bilaterally. GI: Soft, nontender, non-distended. PD catheter on right abdominal wall, is c/d/i and dressed. MS: No edema; No deformity. Neuro:  Nonfocal  Psych: Normal affect   Labs    Chemistry Recent Labs  Lab 02/24/18 1027 02/25/18 0425 02/26/18 0509  NA 140 139 136  K 3.8 3.3* 3.4*  CL 95* 100 98  CO2 23 24 22   GLUCOSE 158* 141* 117*  BUN 43* 43* 40*  CREATININE 12.67* 12.71* 12.51*  CALCIUM 9.1 8.5* 8.1*  ALBUMIN  --  2.8* 2.8*  GFRNONAA 3* 3* 3*  GFRAA 3* 3* 4*  ANIONGAP 22* 15 16*     Hematology Recent Labs  Lab 02/24/18 1027 02/25/18 0425 02/26/18 0509  WBC 8.2 7.7 6.5  RBC 3.85* 3.45* 3.45*  HGB 12.4 11.3* 11.4*  HCT 39.5 35.1* 34.8*  MCV 102.6* 101.7* 100.9*  MCH 32.2 32.8 33.0  MCHC 31.4 32.2 32.8  RDW 15.1 15.1 15.1  PLT 393 342 344    Cardiac Enzymes Recent Labs  Lab 02/24/18 1727 02/24/18 2256 02/25/18 0425  TROPONINI 0.12* 0.13* 0.15*    Recent Labs  Lab 02/24/18 1045 02/24/18 1352  TROPIPOC 0.04  0.13*     BNP Recent Labs  Lab 02/24/18 1027  BNP 36.5     DDimer No results for input(s): DDIMER in the last 168 hours.   Radiology    Dg Chest Port 1 View  Result Date: 02/24/2018 CLINICAL DATA:  Chest pain EXAM: PORTABLE CHEST 1 VIEW COMPARISON:  08/24/2017 FINDINGS: The heart size and mediastinal contours are within normal limits. Both lungs are clear. The visualized skeletal structures are unremarkable. IMPRESSION: No active disease. Electronically Signed   By: Franchot Gallo M.D.   On: 02/24/2018 10:34    Cardiac Studies  Cath 02/25/2018  There is hyperdynamic left ventricular systolic function. The left ventricular ejection fraction is greater than 65% by visual estimate.  LV end diastolic pressure is normal.  Mid LAD lesion is 45% stenosed. -Extensively calcified vessel  Mid Cx to Dist Cx lesion is 55% stenosed with 40% stenosed side branch in Ost 3rd Mrg.  Prox RCA lesion is 65% stenosed. -- DFR 0.95 (NOT  SIGNIFICANT)   SUMMARY  Moderate three-vessel disease with extensive calcification of all 3 major coronary arteries: Proximal RCA 60 to 65% DFR negative, ostial OM1 40% followed by 50% mid OM1, diffuse 40 to 50% calcified mid LAD.  No obvious culprit lesion with negative DFR on the RCA lesion.  Normal LVEDP.  Hyperdynamic LV with EF greater than 65%  RECOMMENDATIONS  Recommend medical management.  Consider beta-blocker based on hyperdynamic LV.  Suspect that her elevated troponin is related to demand ischemia in the setting of microvascular disease  Patient Profile      Assessment & Plan   Principal Problem:   Chest pain Active Problems:   Hyperlipidemia with target LDL less than 100   HYPERTENSION, BENIGN ESSENTIAL   ESRD on peritoneal dialysis [facility])   NSTEMI (non-ST elevated myocardial infarction) ([place])   Coronary artery disease involving native coronary artery of native heart with angina pectoris ([place])   She was noted to have moderate three-vessel extensively calcified disease in the coronaries.  Most significant of which was proximal RCA 60 to 65% with a negative hemodynamic assessment.  LVEDP was normal and there was hyperdynamic LV function.  She struggles with symptomatic hypotension and medication titration will be challenging.  She will be dismissed on carvedilol 3.125 mg twice daily, and ACE or ARB can be considered at the discretion of her nephrologist and as an outpatient.  We discussed that she should follow routinely with a cardiologist, she has seen Dr. Skeet Latch in the past and she would like to see her again.   [facility] will sign off.   Medication Recommendations: ASA 81 MG daily, atorvastatin 80 mg daily, carvedilol 3.125 mg BID.  Other recommendations (labs, testing, etc): - Follow up as an outpatient:  F/u 1-2 weeks Dr. Blenda Mounts office.  For questions or updates, please contact [place] Please consult [email or web address] for contact info  under        Signed, Elouise Munroe, MD  02/26/2018, 9:10 AM

## 2018-02-26 NOTE — Progress Notes (Signed)
[facility] NEPHROLOGY PROGRESS NOTE  Assessment/ Plan: Pt is a 53 y.o. yo female with hypertension, ESRD on peritoneal dialysis follows with Dr. Marval Regal, admitted with atypical chest pain.  Dialyzes at home-6 exchanges, volume 2500, dwell time 1-1/2 hours, 10-minute fill, 25-minute drain She says that she always uses 2.5%-but has noted blood pressure being low.  She is on calcitriol 0.5 mcg daily, Sensipar 60 mg daily, a mixture of PhosLo and Auryxia for phosphate binding 2 pills each  #Chest pain/non-STEMI: Cath with moderate three-vessel disease with extensive calcification, I recommended medical management.  The dose of carvedilol adjusted by cardiologist.  Patient has no chest pain today.  # ESRD on PD: Using 1.5% because of hypotensive episode, patient is euvolemic on exam.  Continue CCPD, 6 exchanges.  Patient will monitor blood pressure at home and switch fluid between 1.5 and 2.5%.  # Anemia: Hemoglobin acceptable, no ESA.  # Secondary hyperparathyroidism: Continue calcitriol, Sensipar and phosphate binder.  Monitor Ca, phosphorus.  #Hypokalemia: Replete KCl 20 mEq.  Monitor  # HTN/volume: Looks euvolemic on exam.  Blood pressure acceptable.  Subjective: Seen and examined.  No chest pain, shortness of breath, nausea or vomiting.  She is going home today.   Objective Vital signs in last 24 hours: Vitals:   02/26/18 0726 02/26/18 0730 02/26/18 0830 02/26/18 0925  BP: (!) 100/53 (!) 106/56 92/60 99/65   Pulse: 70 64 70   Resp: 13 (!) 21 17   Temp: 98.2 F (36.8 C)     TempSrc: Oral     SpO2: 100% 93% 100%   Weight:      Height:       Weight change: 2.665 kg  Intake/Output Summary (Last 24 hours) at 02/26/2018 1104 Last data filed at 02/25/2018 1947 Gross per 24 hour  Intake 300 ml  Output -  Net 300 ml       Labs: Basic Metabolic Panel: Recent Labs  Lab 02/24/18 1027 02/25/18 0425 02/26/18 0509  NA 140 139 136  K 3.8 3.3* 3.4*  CL 95* 100  98  CO2 23 24 22   GLUCOSE 158* 141* 117*  BUN 43* 43* 40*  CREATININE 12.67* 12.71* 12.51*  CALCIUM 9.1 8.5* 8.1*  PHOS  --  7.5* 7.3*   Liver Function Tests: Recent Labs  Lab 02/25/18 0425 02/26/18 0509  ALBUMIN 2.8* 2.8*   No results for input(s): LIPASE, AMYLASE in the last 168 hours. No results for input(s): AMMONIA in the last 168 hours. CBC: Recent Labs  Lab 02/24/18 1027 02/25/18 0425 02/26/18 0509  WBC 8.2 7.7 6.5  HGB 12.4 11.3* 11.4*  HCT 39.5 35.1* 34.8*  MCV 102.6* 101.7* 100.9*  PLT 393 342 344   Cardiac Enzymes: Recent Labs  Lab 02/24/18 1727 02/24/18 2256 02/25/18 0425  TROPONINI 0.12* 0.13* 0.15*   CBG: Recent Labs  Lab 02/25/18 0642 02/25/18 1131 02/25/18 1754 02/25/18 2209 02/26/18 0649  GLUCAP 136* 120* 91 121* 115*    Iron Studies: No results for input(s): IRON, TIBC, TRANSFERRIN, FERRITIN in the last 72 hours. Studies/Results: No results found.  Medications: Infusions: . sodium chloride    . dialysis solution 1.5% low-MG/low-CA      Scheduled Medications: . aspirin EC  81 mg Oral Daily  . atorvastatin  80 mg Oral q1800  . calcitRIOL  0.5 mcg Oral Daily  . calcium acetate  1,334 mg Oral TID WC  . carvedilol  3.125 mg Oral BID WC  . cinacalcet  60 mg Oral  Q supper  . ferric citrate  420 mg Oral TID WC  . gentamicin cream  1 application Topical Daily  . heparin injection (subcutaneous)  5,000 Units Subcutaneous Q8H  . insulin aspart  0-9 Units Subcutaneous TID WC  . insulin aspart  10 Units Subcutaneous TID WC  . insulin glargine  50 Units Subcutaneous Daily  . multivitamin  1 tablet Oral QHS  . potassium chloride  20 mEq Oral Once  . sodium chloride flush  3 mL Intravenous Q12H  . thiamine  100 mg Oral Daily    have reviewed scheduled and prn medications.  Physical Exam: General: Not in distress, comfortable Heart:RRR, s1s2 nl Lungs: Clear b/l, no crackle Abdomen:soft, Non-tender, non-distended Extremities: No  edema Dialysis Access: PD catheter site clean, dressing on, no tenderness  Mckenze Slone Tanna Furry 02/26/2018,11:04 AM  LOS: 0 days

## 2018-02-26 NOTE — Discharge Instructions (Signed)
Aspirin and Your Heart  Aspirin is a medicine that prevents the cells in the blood that are used for clotting, called platelets, from sticking together. Aspirin can be used to help reduce the risk of blood clots, heart attacks, and other heart-related problems. Can I take aspirin? Your health care provider will help you determine whether it is safe and beneficial for you to take aspirin daily. Taking aspirin daily may be helpful if you:  Have had a heart attack or chest pain.  Are at risk for a heart attack.  Have undergone open-heart surgery, such as coronary artery bypass surgery (CABG).  Have had coronary angioplasty or a stent.  Have had certain types of stroke or transient ischemic attack (TIA).  Have peripheral artery disease (PAD).  Have chronic heart rhythm problems such as atrial fibrillation and cannot take an anticoagulant.  Have valve disease or have had surgery on a valve. What are the risks? Daily use of aspirin can cause side effects. Some of these include:  Bleeding. Bleeding problems can be minor or serious. An example of a minor problem is a cut that does not stop bleeding. An example of a more serious problem is stomach bleeding or, rarely, bleeding into the brain. Your risk of bleeding is increased if you are also taking non-steroidal anti-inflammatory drugs (NSAIDs).  Increased bruising.  Upset stomach.  An allergic reaction. People who have nasal polyps have an increased risk of developing an aspirin allergy. General guidelines  Take aspirin only as told by your health care provider. Make sure that you understand how much you should take and what form you should take. The two forms of aspirin are: ? Non-enteric-coated.This type of aspirin does not have a coating and is absorbed quickly. This type of aspirin also comes in a chewable form. ? Enteric-coated. This type of aspirin has a coating that releases the medicine very slowly. Enteric-coated aspirin might  cause less stomach upset than non-enteric-coated aspirin. This type of aspirin should not be chewed or crushed.  Limit alcohol intake to no more than 1 drink a day for nonpregnant women and 2 drinks a day for men. Drinking alcohol increases your risk of bleeding. One drink equals 12 oz of beer, 5 oz of wine, or 1 oz of hard liquor. Contact a health care provider if you:  Have unusual bleeding or bruising.  Have stomach pain or nausea.  Have ringing in your ears.  Have an allergic reaction that causes: ? Hives. ? Itchy skin. ? Swelling of the lips, tongue, or face. Get help right away if you:  Notice that your bowel movements are bloody, dark red, or black in color.  Vomit or cough up blood.  Have blood in your urine.  Cough, have noisy breathing (wheeze), or feel short of breath.  Have chest pain, especially if the pain spreads to the arms, back, neck, or jaw.  Have a severe headache, or a headache with confusion, or dizziness. These symptoms may represent a serious problem that is an emergency. Do not wait to see if the symptoms will go away. Get medical help right away. Call your local emergency services (911 in the [place] Do not drive yourself to the hospital. Summary  Aspirin can be used to help reduce the risk of blood clots, heart attacks, and other heart-related problems.  Daily use of aspirin can increase your risk of side effects. Your health care provider will help you determine whether it is safe and beneficial for you to  take aspirin daily.  Take aspirin only as told by your health care provider. Make sure that you understand how much you can take and what form you can take. This information is not intended to replace advice given to you by your health care provider. Make sure you discuss any questions you have with your health care provider. Document Released: 12/19/2007 Document Revised: 11/05/2016 Document Reviewed: 11/05/2016 Elsevier Interactive Patient  Education  2019 [employer].

## 2018-02-28 ENCOUNTER — Encounter (HOSPITAL_COMMUNITY): Payer: Self-pay | Admitting: Cardiology

## 2018-03-07 ENCOUNTER — Ambulatory Visit (INDEPENDENT_AMBULATORY_CARE_PROVIDER_SITE_OTHER): Payer: 59 | Admitting: Internal Medicine

## 2018-03-07 ENCOUNTER — Encounter: Payer: Self-pay | Admitting: Internal Medicine

## 2018-03-07 ENCOUNTER — Other Ambulatory Visit (INDEPENDENT_AMBULATORY_CARE_PROVIDER_SITE_OTHER): Payer: 59

## 2018-03-07 VITALS — BP 128/82 | HR 80 | Temp 98.3°F | Resp 16 | Ht 65.0 in | Wt 264.0 lb

## 2018-03-07 DIAGNOSIS — E1129 Type 2 diabetes mellitus with other diabetic kidney complication: Secondary | ICD-10-CM | POA: Diagnosis not present

## 2018-03-07 DIAGNOSIS — D539 Nutritional anemia, unspecified: Secondary | ICD-10-CM

## 2018-03-07 DIAGNOSIS — E519 Thiamine deficiency, unspecified: Secondary | ICD-10-CM

## 2018-03-07 DIAGNOSIS — E538 Deficiency of other specified B group vitamins: Secondary | ICD-10-CM

## 2018-03-07 DIAGNOSIS — D638 Anemia in other chronic diseases classified elsewhere: Secondary | ICD-10-CM

## 2018-03-07 DIAGNOSIS — D510 Vitamin B12 deficiency anemia due to intrinsic factor deficiency: Secondary | ICD-10-CM

## 2018-03-07 DIAGNOSIS — Z794 Long term (current) use of insulin: Secondary | ICD-10-CM

## 2018-03-07 DIAGNOSIS — I10 Essential (primary) hypertension: Secondary | ICD-10-CM | POA: Diagnosis not present

## 2018-03-07 LAB — IBC PANEL
Iron: 73 ug/dL (ref 42–145)
Saturation Ratios: 29.5 % (ref 20.0–50.0)
Transferrin: 177 mg/dL — ABNORMAL LOW (ref 212.0–360.0)

## 2018-03-07 LAB — CBC WITH DIFFERENTIAL/PLATELET
Basophils Absolute: 0 10*3/uL (ref 0.0–0.1)
Basophils Relative: 0.5 % (ref 0.0–3.0)
Eosinophils Absolute: 0.4 10*3/uL (ref 0.0–0.7)
Eosinophils Relative: 4.2 % (ref 0.0–5.0)
HCT: 37.6 % (ref 36.0–46.0)
Hemoglobin: 12.4 g/dL (ref 12.0–15.0)
Lymphocytes Relative: 24.1 % (ref 12.0–46.0)
Lymphs Abs: 2.1 10*3/uL (ref 0.7–4.0)
MCHC: 32.9 g/dL (ref 30.0–36.0)
MCV: 100.8 fl — ABNORMAL HIGH (ref 78.0–100.0)
Monocytes Absolute: 0.9 10*3/uL (ref 0.1–1.0)
Monocytes Relative: 10.8 % (ref 3.0–12.0)
Neutro Abs: 5.2 10*3/uL (ref 1.4–7.7)
Neutrophils Relative %: 60.4 % (ref 43.0–77.0)
Platelets: 361 10*3/uL (ref 150.0–400.0)
RBC: 3.73 Mil/uL — ABNORMAL LOW (ref 3.87–5.11)
RDW: 17.6 % — ABNORMAL HIGH (ref 11.5–15.5)
WBC: 8.6 10*3/uL (ref 4.0–10.5)

## 2018-03-07 LAB — POCT GLYCOSYLATED HEMOGLOBIN (HGB A1C): Hemoglobin A1C: 6.6 % — AB (ref 4.0–5.6)

## 2018-03-07 LAB — POCT GLUCOSE (DEVICE FOR HOME USE): POC Glucose: 163 mg/dl — AB (ref 70–99)

## 2018-03-07 MED ORDER — CYANOCOBALAMIN 1000 MCG/ML IJ SOLN
1000.0000 ug | Freq: Once | INTRAMUSCULAR | Status: AC
  Administered 2018-03-07: 1000 ug via INTRAMUSCULAR

## 2018-03-07 NOTE — Progress Notes (Signed)
Subjective:  Patient ID: Brenda Davis, female    DOB: 10-Oct-1965  Age: 53 y.o. MRN: [id number]  CC: Diabetes and Anemia   HPI Brenda Davis presents for f/up - She was recently admitted for chest pain and found to have CAD.  She was also found to be anemic.  She denies CP, S OB, or DOE since discharge.  Outpatient Medications Prior to Visit  Medication Sig Dispense Refill  . acetaminophen (TYLENOL) 325 MG tablet Take 650 mg by mouth every 6 (six) hours as needed for mild pain.    Marland Kitchen aspirin EC 81 MG EC tablet Take 1 tablet (81 mg total) by mouth daily. 30 tablet 0  . atorvastatin (LIPITOR) 80 MG tablet Take 1 tablet (80 mg total) by mouth daily at 6 PM. 30 tablet 0  . AURYXIA 1 GM 210 MG(Fe) tablet Take 210 mg by mouth 3 (three) times daily with meals.   3  . calcitRIOL (ROCALTROL) 0.5 MCG capsule Take 0.5 mcg by mouth daily.    . calcium acetate (PHOSLO) 667 MG capsule Take 2 capsules (1,334 mg total) by mouth 3 (three) times daily with meals. 180 capsule 0  . carvedilol (COREG) 3.125 MG tablet Take 1 tablet (3.125 mg total) by mouth 2 (two) times daily with a meal. 60 tablet 0  . cinacalcet (SENSIPAR) 30 MG tablet Take 2 tablets (60 mg total) by mouth daily with supper. 30 tablet 0  . Cyanocobalamin (B-12 PO) Take 1 tablet by mouth daily.    Marland Kitchen gentamicin cream (GARAMYCIN) 0.1 % Apply 1 application topically See admin instructions. Apply to exit site daily as directed.  4  . insulin aspart (NOVOLOG) 100 UNIT/ML FlexPen Inject 10 Units into the skin 3 (three) times daily with meals. 15 mL 5  . Insulin Glargine (BASAGLAR KWIKPEN) 100 UNIT/ML SOPN Inject 0.5 mLs (50 Units total) into the skin daily. 9 mL 1  . Insulin Pen Needle (NOVOFINE) 32G X 6 MM MISC 1 Act by Does not apply route 4 (four) times daily -  with meals and at bedtime. 300 each 1  . Multiple Vitamins-Minerals (PRORENAL + D) TABS Take 1 tablet by mouth daily.  2  . multivitamin (RENA-VIT) TABS tablet  Take 1 tablet by mouth at bedtime. 30 tablet 0  . thiamine (VITAMIN B-1) 100 MG tablet Take 1 tablet (100 mg total) by mouth daily. 90 tablet 1   No facility-administered medications prior to visit.     ROS Review of Systems  Constitutional: Positive for fatigue. Negative for diaphoresis.  HENT: Negative.  Negative for trouble swallowing.   Eyes: Negative for visual disturbance.  Respiratory: Negative for cough, chest tightness, shortness of breath and wheezing.   Cardiovascular: Negative for chest pain, palpitations and leg swelling.  Gastrointestinal: Negative for abdominal pain, constipation, diarrhea, nausea and vomiting.  Genitourinary: Negative.  Negative for difficulty urinating.  Musculoskeletal: Negative for arthralgias and myalgias.  Skin: Negative.   Neurological: Negative.  Negative for dizziness and weakness.  Hematological: Negative for adenopathy. Does not bruise/bleed easily.  Psychiatric/Behavioral: Negative.     Objective:  BP 128/82 (BP Location: Right Arm, Patient Position: Sitting, Cuff Size: Normal)   Pulse 80   Temp 98.3 F (36.8 C) (Oral)   Resp 16   Ht 5\' 5"  (1.651 m)   Wt 264 lb 0.6 oz (119.8 kg)   SpO2 99%   BMI 43.94 kg/m   BP Readings from Last 3 Encounters:  03/08/18 110/83  03/07/18  128/82  02/26/18 99/65    Wt Readings from Last 3 Encounters:  03/08/18 263 lb (119.3 kg)  03/07/18 264 lb 0.6 oz (119.8 kg)  02/26/18 268 lb 4.8 oz (121.7 kg)    Physical Exam Vitals signs reviewed.  Constitutional:      Appearance: She is obese. She is not ill-appearing or diaphoretic.  HENT:     Nose: Nose normal. No congestion or rhinorrhea.     Mouth/Throat:     Pharynx: Oropharynx is clear. No oropharyngeal exudate or posterior oropharyngeal erythema.  Eyes:     Conjunctiva/sclera: Conjunctivae normal.  Neck:     Musculoskeletal: Normal range of motion and neck supple. No neck rigidity or muscular tenderness.  Cardiovascular:     Rate and  Rhythm: Normal rate and regular rhythm.     Pulses: Normal pulses.     Heart sounds: No murmur. No gallop.   Pulmonary:     Effort: Pulmonary effort is normal. No respiratory distress.     Breath sounds: No stridor. No wheezing, rhonchi or rales.  Abdominal:     General: Bowel sounds are normal. There is no distension.     Palpations: There is no hepatomegaly, splenomegaly or mass.     Tenderness: There is no abdominal tenderness. There is no guarding.  Musculoskeletal: Normal range of motion.        General: No swelling.     Right lower leg: No edema.     Left lower leg: No edema.  Skin:    General: Skin is warm and dry.  Neurological:     General: No focal deficit present.     Mental Status: She is oriented to person, place, and time. Mental status is at baseline.     Lab Results  Component Value Date   WBC 8.6 03/07/2018   HGB 12.4 03/07/2018   HCT 37.6 03/07/2018   PLT 361.0 03/07/2018   GLUCOSE 117 (H) 02/26/2018   CHOL 225 (H) 02/26/2018   TRIG 186 (H) 02/26/2018   HDL 38 (L) 02/26/2018   LDLDIRECT 161.6 11/28/2012   LDLCALC 150 (H) 02/26/2018   ALT 16 10/28/2017   AST 13 10/28/2017   NA 136 02/26/2018   K 3.4 (L) 02/26/2018   CL 98 02/26/2018   CREATININE 12.51 (H) 02/26/2018   BUN 40 (H) 02/26/2018   CO2 22 02/26/2018   TSH 4.10 03/07/2018   INR 1.0 11/26/2017   HGBA1C 6.6 (A) 03/07/2018   MICROALBUR 159.00 (H) 09/17/2009    Dg Chest Port 1 View  Result Date: 02/24/2018 CLINICAL DATA:  Chest pain EXAM: PORTABLE CHEST 1 VIEW COMPARISON:  08/24/2017 FINDINGS: The heart size and mediastinal contours are within normal limits. Both lungs are clear. The visualized skeletal structures are unremarkable. IMPRESSION: No active disease. Electronically Signed   By: Franchot Gallo M.D.   On: 02/24/2018 10:34    Assessment & Plan:   Brenda Davis was seen today for diabetes and anemia.  Diagnoses and all orders for this visit:  Type 2 diabetes mellitus with other diabetic  kidney complication, with long-term current use of insulin ([place])- Her blood sugars are adequately well controlled. -     POCT glycosylated hemoglobin (Hb A1C) -     POCT Glucose (Device for Home Use)  Vitamin B12 deficiency anemia due to intrinsic factor deficiency- Her H&H and B12 levels are normal now. -     CBC with Differential/Platelet; Future -     Folate; Future -  cyanocobalamin ((VITAMIN B-12)) injection 1,000 mcg  Hypertension, unspecified type- Her blood pressure is adequately well controlled. -     TSH; Future  Anemia of chronic disease- Her H&H are normal but she does have folate deficiency. -     CBC with Differential/Platelet; Future -     Ferritin; Future -     IBC panel; Future  Deficiency anemia- See above -     CBC with Differential/Platelet; Future -     Ferritin; Future -     IBC panel; Future -     Vitamin B1; Future  Thiamine deficiency- I will monitor her thiamine level. -     CBC with Differential/Platelet; Future -     Vitamin B1; Future  Folate deficiency -     folic acid (FOLVITE) 1 MG tablet; Take 1 tablet (1 mg total) by mouth daily.   I am having Brenda Davis start on folic acid. I am also having her maintain her calcium acetate, multivitamin, AURYXIA, gentamicin cream, thiamine, Cyanocobalamin (B-12 PO), Insulin Pen Needle, PRORENAL + D, BASAGLAR KWIKPEN, insulin aspart, acetaminophen, calcitRIOL, aspirin, atorvastatin, carvedilol, and cinacalcet. We administered cyanocobalamin.  Meds ordered this encounter  Medications  . cyanocobalamin ((VITAMIN B-12)) injection 1,000 mcg  . folic acid (FOLVITE) 1 MG tablet    Sig: Take 1 tablet (1 mg total) by mouth daily.    Dispense:  90 tablet    Refill:  1     Follow-up: Return in about 3 months (around 06/05/2018).  Scarlette Calico, MD

## 2018-03-07 NOTE — Patient Instructions (Signed)
Anemia  Anemia is a condition in which you do not have enough red blood cells or hemoglobin. Hemoglobin is a substance in red blood cells that carries oxygen. When you do not have enough red blood cells or hemoglobin (are anemic), your body cannot get enough oxygen and your organs may not work properly. As a result, you may feel very tired or have other problems. What are the causes? Common causes of anemia include:  Excessive bleeding. Anemia can be caused by excessive bleeding inside or outside the body, including bleeding from the intestine or from periods in women.  Poor nutrition.  Long-lasting (chronic) kidney, thyroid, and liver disease.  Bone marrow disorders.  Cancer and treatments for cancer.  HIV (human immunodeficiency virus) and AIDS (acquired immunodeficiency syndrome).  Treatments for HIV and AIDS.  Spleen problems.  Blood disorders.  Infections, medicines, and autoimmune disorders that destroy red blood cells. What are the signs or symptoms? Symptoms of this condition include:  Minor weakness.  Dizziness.  Headache.  Feeling heartbeats that are irregular or faster than normal (palpitations).  Shortness of breath, especially with exercise.  Paleness.  Cold sensitivity.  Indigestion.  Nausea.  Difficulty sleeping.  Difficulty concentrating. Symptoms may occur suddenly or develop slowly. If your anemia is mild, you may not have symptoms. How is this diagnosed? This condition is diagnosed based on:  Blood tests.  Your medical history.  A physical exam.  Bone marrow biopsy. Your health care provider may also check your stool (feces) for blood and may do additional testing to look for the cause of your bleeding. You may also have other tests, including:  Imaging tests, such as a CT scan or MRI.  Endoscopy.  Colonoscopy. How is this treated? Treatment for this condition depends on the cause. If you continue to lose a lot of blood, you may  need to be treated at a hospital. Treatment may include:  Taking supplements of iron, vitamin S31, or folic acid.  Taking a hormone medicine (erythropoietin) that can help to stimulate red blood cell growth.  Having a blood transfusion. This may be needed if you lose a lot of blood.  Making changes to your diet.  Having surgery to remove your spleen. Follow these instructions at home:  Take over-the-counter and prescription medicines only as told by your health care provider.  Take supplements only as told by your health care provider.  Follow any diet instructions that you were given.  Keep all follow-up visits as told by your health care provider. This is important. Contact a health care provider if:  You develop new bleeding anywhere in the body. Get help right away if:  You are very weak.  You are short of breath.  You have pain in your abdomen or chest.  You are dizzy or feel faint.  You have trouble concentrating.  You have bloody or black, tarry stools.  You vomit repeatedly or you vomit up blood. Summary  Anemia is a condition in which you do not have enough red blood cells or enough of a substance in your red blood cells that carries oxygen (hemoglobin).  Symptoms may occur suddenly or develop slowly.  If your anemia is mild, you may not have symptoms.  This condition is diagnosed with blood tests as well as a medical history and physical exam. Other tests may be needed.  Treatment for this condition depends on the cause of the anemia. This information is not intended to replace advice given to you by  your health care provider. Make sure you discuss any questions you have with your health care provider. Document Released: 02/13/2004 Document Revised: 02/07/2016 Document Reviewed: 02/07/2016 Elsevier Interactive Patient Education  2019 [employer].

## 2018-03-08 ENCOUNTER — Encounter: Payer: Self-pay | Admitting: Cardiology

## 2018-03-08 ENCOUNTER — Encounter: Payer: Self-pay | Admitting: Internal Medicine

## 2018-03-08 ENCOUNTER — Ambulatory Visit (INDEPENDENT_AMBULATORY_CARE_PROVIDER_SITE_OTHER): Payer: 59 | Admitting: Cardiology

## 2018-03-08 VITALS — BP 110/83 | HR 83 | Ht 65.0 in | Wt 263.0 lb

## 2018-03-08 DIAGNOSIS — N186 End stage renal disease: Secondary | ICD-10-CM

## 2018-03-08 DIAGNOSIS — Z794 Long term (current) use of insulin: Secondary | ICD-10-CM

## 2018-03-08 DIAGNOSIS — E538 Deficiency of other specified B group vitamins: Secondary | ICD-10-CM | POA: Insufficient documentation

## 2018-03-08 DIAGNOSIS — R7989 Other specified abnormal findings of blood chemistry: Secondary | ICD-10-CM | POA: Diagnosis not present

## 2018-03-08 DIAGNOSIS — E1122 Type 2 diabetes mellitus with diabetic chronic kidney disease: Secondary | ICD-10-CM

## 2018-03-08 DIAGNOSIS — R778 Other specified abnormalities of plasma proteins: Secondary | ICD-10-CM | POA: Insufficient documentation

## 2018-03-08 DIAGNOSIS — I2510 Atherosclerotic heart disease of native coronary artery without angina pectoris: Secondary | ICD-10-CM

## 2018-03-08 DIAGNOSIS — E785 Hyperlipidemia, unspecified: Secondary | ICD-10-CM

## 2018-03-08 DIAGNOSIS — Z992 Dependence on renal dialysis: Secondary | ICD-10-CM

## 2018-03-08 DIAGNOSIS — Z6841 Body Mass Index (BMI) 40.0 and over, adult: Secondary | ICD-10-CM

## 2018-03-08 LAB — FERRITIN: Ferritin: >1500 ng/mL — ABNORMAL HIGH (ref 10.0–291.0)

## 2018-03-08 LAB — FOLATE: Folate: 4 ng/mL — ABNORMAL LOW (ref >5.9)

## 2018-03-08 LAB — TSH: TSH: 4.1 u[IU]/mL (ref 0.35–4.50)

## 2018-03-08 MED ORDER — FOLIC ACID 1 MG PO TABS: 1.0000 mg | ORAL_TABLET | Freq: Every day | ORAL | 1 refills | Status: DC

## 2018-03-08 NOTE — Assessment & Plan Note (Signed)
BMI 43 

## 2018-03-08 NOTE — Progress Notes (Signed)
03/08/2018 Brenda Davis   03-28-1965  [id number]  Primary Physician [name], MD Primary Cardiologist: Dr Oval Linsey  HPI: The patient is a pleasant 53 year old female followed by Dr. Oval Linsey with a history of palpitations in the past.  She is a dialysis patient.  She apparently had issues with hypotension and palpitations while on hemodialysis in the past.  She was transitioned to peritoneal dialysis and is done much better.  She is in the office today as a follow-up from her recent hospitalization for chest pain February 25, 2018.  Patient was admitted with chest pain suspicious for angina.  Her troponin went to 0.15.  Was decided to proceed with diagnostic catheterization.  This revealed moderate three-vessel disease with a 45% LAD, 55% CFX, and 65% RCA.  Echocardiogram showed normal to hyperdynamic LV function.  The patient was placed on statin therapy, aspirin 81 mg and low-dose beta-blocker.  Since discharge she has been doing well.  She denies any further chest pain.  She has occasional palpitations at night but that seems to be easing as well.  Current Outpatient Medications  Medication Sig Dispense Refill  . acetaminophen (TYLENOL) 325 MG tablet Take 650 mg by mouth every 6 (six) hours as needed for mild pain.    Marland Kitchen aspirin EC 81 MG EC tablet Take 1 tablet (81 mg total) by mouth daily. 30 tablet 0  . atorvastatin (LIPITOR) 80 MG tablet Take 1 tablet (80 mg total) by mouth daily at 6 PM. 30 tablet 0  . AURYXIA 1 GM 210 MG(Fe) tablet Take 210 mg by mouth 3 (three) times daily with meals.   3  . calcitRIOL (ROCALTROL) 0.5 MCG capsule Take 0.5 mcg by mouth daily.    . calcium acetate (PHOSLO) 667 MG capsule Take 2 capsules (1,334 mg total) by mouth 3 (three) times daily with meals. 180 capsule 0  . carvedilol (COREG) 3.125 MG tablet Take 1 tablet (3.125 mg total) by mouth 2 (two) times daily with a meal. 60 tablet 0  . cinacalcet (SENSIPAR) 30 MG tablet Take 2 tablets (60  mg total) by mouth daily with supper. 30 tablet 0  . Cyanocobalamin (B-12 PO) Take 1 tablet by mouth daily.    Marland Kitchen gentamicin cream (GARAMYCIN) 0.1 % Apply 1 application topically See admin instructions. Apply to exit site daily as directed.  4  . insulin aspart (NOVOLOG) 100 UNIT/ML FlexPen Inject 10 Units into the skin 3 (three) times daily with meals. 15 mL 5  . Insulin Glargine (BASAGLAR KWIKPEN) 100 UNIT/ML SOPN Inject 0.5 mLs (50 Units total) into the skin daily. 9 mL 1  . Insulin Pen Needle (NOVOFINE) 32G X 6 MM MISC 1 Act by Does not apply route 4 (four) times daily -  with meals and at bedtime. 300 each 1  . Multiple Vitamins-Minerals (PRORENAL + D) TABS Take 1 tablet by mouth daily.  2  . multivitamin (RENA-VIT) TABS tablet Take 1 tablet by mouth at bedtime. 30 tablet 0  . thiamine (VITAMIN B-1) 100 MG tablet Take 1 tablet (100 mg total) by mouth daily. 90 tablet 1   No current facility-administered medications for this visit.     Allergies  Allergen Reactions  . Amlodipine Swelling    edema  . Clonidine Derivatives Swelling    Limbs swell  . Welchol [Colesevelam Hcl] Nausea Only    Past Medical History:  Diagnosis Date  . Anemia   . Chronic kidney disease    STAGE 3  CHRONIC KIDNEY DISEASE SECONDARY TO DIABETIC GLOMERULOSCLEROSIS AND UNCONTROLLED HYPERTENSION - PER OFFICE NOTES DR. Florene Glen -[facility].  Marland Kitchen ESRD on dialysis [facility])    "MWF; [address] (05/06/2016)  . Gout   . HCAP (healthcare-associated pneumonia) 05/06/2016  . History of blood transfusion    "related to surgery"  . Hyperlipidemia   . Hypertension   . Morbid obesity ([place])   . Pain    LEFT SHOULDER PAIN - WAS SEEN AT AN URGENT CARE - GIVEN SLING FOR COMFORT AND TOLD ROM AS TOLERATED.  Marland Kitchen Palpitations 09/24/2016  . Type II diabetes mellitus ([place])    "gastric sleeve OR corrected this" (05/06/2016)    Social History   Socioeconomic History  . Marital status: Married    Spouse name: Not on file   . Number of children: 3  . Years of education: Not on file  . Highest education level: Not on file  Occupational History  . Occupation: Research scientist (physical sciences): [employer]  Social Needs  . Financial resource strain: Not on file  . Food insecurity:    Worry: Not on file    Inability: Not on file  . Transportation needs:    Medical: Not on file    Non-medical: Not on file  Tobacco Use  . Smoking status: Former Smoker    Packs/day: 1.00    Years: 16.00    Pack years: 16.00    Types: Cigarettes    Last attempt to quit: 2009    Years since quitting: 11.1  . Smokeless tobacco: Never Used  Substance and Sexual Activity  . Alcohol use: Never    Frequency: Never  . Drug use: No  . Sexual activity: Yes    Birth control/protection: Post-menopausal  Lifestyle  . Physical activity:    Days per week: Not on file    Minutes per session: Not on file  . Stress: Not on file  Relationships  . Social connections:    Talks on phone: Not on file    Gets together: Not on file    Attends religious service: Not on file    Active member of club or organization: Not on file    Attends meetings of clubs or organizations: Not on file    Relationship status: Not on file  . Intimate partner violence:    Fear of current or ex partner: Not on file    Emotionally abused: Not on file    Physically abused: Not on file    Forced sexual activity: Not on file  Other Topics Concern  . Not on file  Social History Narrative  . Not on file     Family History  Problem Relation Age of Onset  . Hypertension Mother   . Mitral valve prolapse Mother   . Diabetes Father   . Arthritis Other   . Hyperlipidemia Other   . Cancer Neg Hx   . Heart disease Neg Hx   . Kidney disease Neg Hx   . Stroke Neg Hx      Review of Systems: General: negative for chills, fever, night sweats or weight changes.  Cardiovascular: negative for chest pain, dyspnea on exertion, edema, orthopnea, palpitations, paroxysmal  nocturnal dyspnea or shortness of breath Dermatological: negative for rash Respiratory: negative for cough or wheezing Urologic: negative for hematuria Abdominal: negative for nausea, vomiting, diarrhea, bright red blood per rectum, melena, or hematemesis Neurologic: negative for visual changes, syncope, or dizziness All other systems reviewed and are otherwise  negative except as noted above.    Blood pressure 110/83, pulse 83, height 5\' 5"  (1.651 m), weight 263 lb (119.3 kg).  General appearance: alert, cooperative, no distress and morbidly obese Lungs: clear to auscultation bilaterally Heart: regular rate and rhythm Extremities: RFA cath dressing removed- site without hematoma or ecchymosis Skin: Skin color, texture, turgor normal. No rashes or lesions Neurologic: Grossly normal  EKG NSR, TWI 1, AVL  ASSESSMENT AND PLAN:   CAD in native artery Moderate 3V CAD at cath 02/25/2018 45% LAD 55% CFX 65% RCA Normal (hyperdynamic) LVF  Elevated troponin Demand ischemia- Feb 2020  ESRD on peritoneal dialysis [facility]) Followed by  Dr Marval Regal  DM (diabetes mellitus), type 2 with renal complications (HCC) IDDM  Dyslipidemia, goal LDL below 70 High dose statin Rx added  Morbid obesity with BMI of 40.0-44.9, adult (HCC) BMI 43-    PLAN  Check lipids and CMET in 3 months  Kerin Ransom PA-C 03/08/2018 8:33 AM

## 2018-03-08 NOTE — Assessment & Plan Note (Addendum)
Followed by  Dr Marval Regal

## 2018-03-08 NOTE — Assessment & Plan Note (Signed)
Demand ischemia- Feb 2020

## 2018-03-08 NOTE — Assessment & Plan Note (Signed)
Moderate 3V CAD at cath 02/25/2018 45% LAD 55% CFX 65% RCA Normal (hyperdynamic) LVF

## 2018-03-08 NOTE — Patient Instructions (Addendum)
Medication Instructions:  Your physician recommends that you continue on your current medications as directed. Please refer to the Current Medication list given to you today. If you need a refill on your cardiac medications before your next appointment, please call your pharmacy.   Lab work: Your physician recommends that you return for lab work in: 3 months FASTING Lipid and CMET (Complete 1 week before your next appointment)  If you have labs (blood work) drawn today and your tests are completely normal, you will receive your results only by: Marland Kitchen MyChart Message (if you have MyChart) OR . A paper copy in the mail If you have any lab test that is abnormal or we need to change your treatment, we will call you to review the results.  Testing/Procedures: None   Follow-Up: . At [facility], you and your health needs are our priority.  As part of our continuing mission to provide you with exceptional heart care, we have created designated Provider Care Teams.  These Care Teams include your primary Cardiologist (physician) and Advanced Practice Providers (APPs -  Physician Assistants and Nurse Practitioners) who all work together to provide you with the care you need, when you need it.  Your physician recommends that you schedule a follow-up appointment in: 3 months with Kerin Ransom, PA-C  Any Other Special Instructions Will Be Listed Below (If Applicable).

## 2018-03-08 NOTE — Assessment & Plan Note (Signed)
High dose statin Rx added

## 2018-03-08 NOTE — Assessment & Plan Note (Signed)
IDDM 

## 2018-03-11 LAB — VITAMIN B1: Vitamin B1 (Thiamine): 15 nmol/L (ref 8–30)

## 2018-03-13 ENCOUNTER — Ambulatory Visit (INDEPENDENT_AMBULATORY_CARE_PROVIDER_SITE_OTHER): Payer: 59

## 2018-03-13 ENCOUNTER — Encounter (HOSPITAL_COMMUNITY): Payer: Self-pay | Admitting: Emergency Medicine

## 2018-03-13 ENCOUNTER — Ambulatory Visit (HOSPITAL_COMMUNITY)
Admission: EM | Admit: 2018-03-13 | Discharge: 2018-03-13 | Disposition: A | Discharge status: Home / Self Care | Payer: 59 | Attending: Emergency Medicine | Admitting: Emergency Medicine

## 2018-03-13 ENCOUNTER — Other Ambulatory Visit: Payer: Self-pay

## 2018-03-13 DIAGNOSIS — R05 Cough: Secondary | ICD-10-CM | POA: Diagnosis not present

## 2018-03-13 DIAGNOSIS — J069 Acute upper respiratory infection, unspecified: Secondary | ICD-10-CM

## 2018-03-13 MED ORDER — SALINE SPRAY 0.65 % NA SOLN: 1.0000 | NASAL | 0 refills | Status: DC | PRN

## 2018-03-13 MED ORDER — BENZONATATE 100 MG PO CAPS: 100.0000 mg | ORAL_CAPSULE | Freq: Three times a day (TID) | ORAL | 0 refills | Status: DC

## 2018-03-13 NOTE — ED Triage Notes (Signed)
Onset Thursday of symptoms.  Patient reports vomiting, hot and cold chills, generalized weakness and coughing

## 2018-03-13 NOTE — ED Provider Notes (Signed)
[facility]    CSN: [id number] Arrival date & time: 03/13/18  1205     History   Chief Complaint Chief Complaint  Patient presents with  . URI    HPI Brenda Davis is a 53 y.o. female.   Reshunda presents with complaints of productive cough, fatigue, intermittent dizziness, "hot and cold flashes." Cough becomes severe and then causes chest pain . Denies any current chest pain. Symptoms started 2/20 with cough. Has some nasal congestion. Cough production is white/clear. No sore throat, no ear pain. No leg swelling. Feels shortness of breath  With activity, states has had this issue in the past as well, not necessarily changed. She endorses dizziness with position changes, has had this in past as well related to her dialysis. She completes nightly peritoneal dialysis. States she skipped this two nights ago as her BP was in the 95'M systolic. She has been working with her nephrologist since January to make dialysis adjustments to manage her BP and symptoms with orthostatic hypotension.  No specific known fevers. She has has nausea and vomiting, last emesis this morning. States has had diarrhea since having her gallbladder removed and occasional nausea. No blood in emesis. Decreased appetite. NSTEMI with hospitalization and catheterization 2/6. No stent placement. Has since followed up with PCP and with cardiology. Saw cardiology for followup 2/18. States she has felt so fatigued she stayed in bed all day on 2/20. Hx of gout, htn, hyperlipidemia, diabetes, palpitations, hepatic cirrhosis, chf. No known ill contacts.    ROS per HPI.      Past Medical History:  Diagnosis Date  . Anemia   . Chronic kidney disease    STAGE 3 CHRONIC KIDNEY DISEASE SECONDARY TO DIABETIC GLOMERULOSCLEROSIS AND UNCONTROLLED HYPERTENSION - PER OFFICE NOTES DR. Florene Glen -[facility].  Marland Kitchen ESRD on dialysis [facility])    "MWF; [address] (05/06/2016)  . Gout   . HCAP  (healthcare-associated pneumonia) 05/06/2016  . History of blood transfusion    "related to surgery"  . Hyperlipidemia   . Hypertension   . Morbid obesity ([place])   . Pain    LEFT SHOULDER PAIN - WAS SEEN AT AN URGENT CARE - GIVEN SLING FOR COMFORT AND TOLD ROM AS TOLERATED.  Marland Kitchen Palpitations 09/24/2016  . Type II diabetes mellitus ([place])    "gastric sleeve OR corrected this" (05/06/2016)    Patient Active Problem List   Diagnosis Date Noted  . Elevated troponin 03/08/2018  . Folate deficiency 03/08/2018  . CAD in native artery   . Hepatic cirrhosis ([place]) 11/24/2017  . Omental infarction ([place]) 08/25/2017  . ESRD on peritoneal dialysis ([place]) 08/25/2017  . DM (diabetes mellitus), type 2 with renal complications ([place]) 84/13/2440  . Deficiency anemia 08/24/2017  . Elevated lipase 08/24/2017  . Chronic heart failure with preserved ejection fraction ([place]) 06/22/2017  . Hypertension 09/15/2016  . Sensorineural hearing loss (SNHL), bilateral 06/29/2016  . Allergic rhinitis 06/27/2016  . Severe persistent asthma with exacerbation 05/12/2016  . Anemia of chronic disease 05/06/2016  . Routine general medical examination at a health care facility 12/05/2015  . Cervical cancer screening 12/05/2015  . Visit for screening mammogram 01/23/2015  . Sinus pause 01/04/2015  . Gout due to renal impairment 09/13/2014  . Thiamine deficiency 12/02/2012  . Meralgia paraesthetica 11/28/2012  . S/P laparoscopic sleeve gastrectomy 11/28/2012  . B12 deficiency anemia 11/28/2012  . Morbid obesity with BMI of 40.0-44.9, adult ([place]) 07/22/2012  . Dyslipidemia, goal LDL below 70 04/09/2008  .  CKD (chronic kidney disease) stage V requiring chronic dialysis (MWF) 02/08/2008    Past Surgical History:  Procedure Laterality Date  . AV FISTULA PLACEMENT Left 01/03/2015   Procedure: BRACHIAL CEPHALIC ARTERIOVENOUS  FISTULA CREATION LEFT ARM;  Surgeon: Angelia Mould, MD;  Location: [facility];  Service: Vascular;   Laterality: Left;  . CARPAL TUNNEL RELEASE Right   . [place]  . CHOLECYSTECTOMY  09/14/2017  . ESOPHAGOGASTRODUODENOSCOPY N/A 09/04/2012   Procedure: ESOPHAGOGASTRODUODENOSCOPY (EGD);  Surgeon: Shann Medal, MD;  Location[facility];  Service: General;  Laterality: N/A;  PF  . ESOPHAGOGASTRODUODENOSCOPY (EGD) WITH ESOPHAGEAL DILATION N/A 09/29/2012   Procedure: ESOPHAGOGASTRODUODENOSCOPY (EGD) WITH ESOPHAGEAL DILATION;  Surgeon: Milus Banister, MD;  Location: [facility];  Service: Endoscopy;  Laterality: N/A;  . INSERTION OF DIALYSIS CATHETER N/A 01/03/2015   Procedure: INSERTION OF DIALYSIS CATHETER RIGHT INTERNAL JUGULAR;  Surgeon: Angelia Mould, MD;  Location: [facility];  Service: Vascular;  Laterality: N/A;  . LAPAROSCOPIC GASTRIC SLEEVE RESECTION N/A 07/19/2012   Procedure: LAPAROSCOPIC SLEEVE GASTRECTOMY with EGD;  Surgeon: Madilyn Hook, DO;  Location: [facility];  Service: General;  Laterality: N/A;  laparoscopic sleeve gastrectomy with EGD  . LEFT HEART CATH AND CORONARY ANGIOGRAPHY N/A 02/25/2018   Procedure: LEFT HEART CATH AND CORONARY ANGIOGRAPHY;  Surgeon: Leonie Man, MD;  Location: [facility];  Service: Cardiovascular;  Laterality: N/A;  . PERIPHERAL VASCULAR CATHETERIZATION Left 05/23/2015   Procedure: Nolon Stalls;  Surgeon: Conrad Eastman, MD;  Location: [facility];  Service: Cardiovascular;  Laterality: Left;  upper aRM  . PERIPHERAL VASCULAR CATHETERIZATION Left 05/23/2015   Procedure: Peripheral Vascular Balloon Angioplasty;  Surgeon: Conrad Ringwood, MD;  Location: [facility];  Service: Cardiovascular;  Laterality: Left;  av fistula  . TUBAL LIGATION  1993  . UPPER GI ENDOSCOPY N/A 07/19/2012   Procedure: UPPER GI ENDOSCOPY;  Surgeon: Madilyn Hook, DO;  Location: [facility];  Service: General;  Laterality: N/A;    OB History   No obstetric history on file.      Home Medications    Prior to Admission medications   Medication  Sig Start Date End Date Taking? Authorizing Provider  acetaminophen (TYLENOL) 325 MG tablet Take 650 mg by mouth every 6 (six) hours as needed for mild pain.   Yes [provider]  aspirin EC 81 MG EC tablet Take 1 tablet (81 mg total) by mouth daily. 02/26/18   Norval Morton, MD  atorvastatin (LIPITOR) 80 MG tablet Take 1 tablet (80 mg total) by mouth daily at 6 PM. 02/26/18   Norval Morton, MD  AURYXIA 1 GM 210 MG(Fe) tablet Take 210 mg by mouth 3 (three) times daily with meals.  04/07/17   [provider]  benzonatate (TESSALON) 100 MG capsule Take 1 capsule (100 mg total) by mouth every 8 (eight) hours. 03/13/18   Zigmund Gottron, NP  calcitRIOL (ROCALTROL) 0.5 MCG capsule Take 0.5 mcg by mouth daily. 12/24/17   [provider]  calcium acetate (PHOSLO) 667 MG capsule Take 2 capsules (1,334 mg total) by mouth 3 (three) times daily with meals. 01/07/15   Eugenie Filler, MD  carvedilol (COREG) 3.125 MG tablet Take 1 tablet (3.125 mg total) by mouth 2 (two) times daily with a meal. 02/26/18   Norval Morton, MD  cinacalcet (SENSIPAR) 30 MG tablet Take 2 tablets (60 mg total) by mouth daily with supper. 02/26/18  Fuller Plan A, MD  Cyanocobalamin (B-12 PO) Take 1 tablet by mouth daily.    [provider]  folic acid (FOLVITE) 1 MG tablet Take 1 tablet (1 mg total) by mouth daily. 03/08/18   [name], MD  gentamicin cream (GARAMYCIN) 0.1 % Apply 1 application topically See admin instructions. Apply to exit site daily as directed. 04/26/17   [provider]  insulin aspart (NOVOLOG) 100 UNIT/ML FlexPen Inject 10 Units into the skin 3 (three) times daily with meals. 12/01/17   [name], MD  Insulin Glargine (BASAGLAR KWIKPEN) 100 UNIT/ML SOPN Inject 0.5 mLs (50 Units total) into the skin daily. 12/01/17   [name], MD  Insulin Pen Needle (NOVOFINE) 32G X 6 MM MISC 1 Act by Does not apply route 4 (four) times daily -  with meals and at  bedtime. 11/22/17   [name], MD  Multiple Vitamins-Minerals (PRORENAL + D) TABS Take 1 tablet by mouth daily. 10/29/17   [provider]  multivitamin (RENA-VIT) TABS tablet Take 1 tablet by mouth at bedtime. 01/07/15   Eugenie Filler, MD  sodium chloride (OCEAN) 0.65 % SOLN nasal spray Place 1 spray into both nostrils as needed for congestion. 03/13/18   Zigmund Gottron, NP  thiamine (VITAMIN B-1) 100 MG tablet Take 1 tablet (100 mg total) by mouth daily. 06/21/17   [name], MD    Family History Family History  Problem Relation Age of Onset  . Hypertension Mother   . Mitral valve prolapse Mother   . Diabetes Father   . Arthritis Other   . Hyperlipidemia Other   . Cancer Neg Hx   . Heart disease Neg Hx   . Kidney disease Neg Hx   . Stroke Neg Hx     Social History Social History   Tobacco Use  . Smoking status: Former Smoker    Packs/day: 1.00    Years: 16.00    Pack years: 16.00    Types: Cigarettes    Last attempt to quit: 2009    Years since quitting: 11.1  . Smokeless tobacco: Never Used  Substance Use Topics  . Alcohol use: Never    Frequency: Never  . Drug use: No     Allergies   Amlodipine; Clonidine derivatives; and Welchol [colesevelam hcl]   Review of Systems Review of Systems   Physical Exam Triage Vital Signs ED Triage Vitals  Enc Vitals Group     BP 03/13/18 1229 117/78     Pulse Rate 03/13/18 1229 83     Resp 03/13/18 1229 16     Temp 03/13/18 1229 98.1 F (36.7 C)     Temp Source 03/13/18 1229 Oral     SpO2 03/13/18 1229 100 %     Weight --      Height --      Head Circumference --      Peak Flow --      Pain Score 03/13/18 1227 5     Pain Loc --      Pain Edu? --      Excl. in [place]? --    No data found.  Updated Vital Signs BP 117/78 (BP Location: Right Arm)   Pulse 83   Temp 98.1 F (36.7 C) (Oral)   Resp 16   SpO2 100%    Physical Exam Constitutional:      General: She is not in acute  distress.    Appearance: She  is well-developed.  HENT:     Head: Normocephalic and atraumatic.     Right Ear: Tympanic membrane, ear canal and external ear normal.     Left Ear: Tympanic membrane, ear canal and external ear normal.     Nose: Congestion present.     Mouth/Throat:     Pharynx: Uvula midline.     Tonsils: No tonsillar exudate.  Eyes:     Conjunctiva/sclera: Conjunctivae normal.     Pupils: Pupils are equal, round, and reactive to light.  Cardiovascular:     Rate and Rhythm: Normal rate and regular rhythm.     Heart sounds: Normal heart sounds.  Pulmonary:     Effort: Pulmonary effort is normal. No respiratory distress.     Breath sounds: Normal breath sounds. No wheezing.     Comments: Occasional congested cough, no wheezing, no increased work of breathing noted  Musculoskeletal:     Right lower leg: No edema.     Left lower leg: No edema.  Skin:    General: Skin is warm and dry.  Neurological:     Mental Status: She is alert and oriented to person, place, and time.      UC Treatments / Results  Labs (all labs ordered are listed, but only abnormal results are displayed) Labs Reviewed - No data to display  EKG None  Radiology Dg Chest 2 View  Result Date: 03/13/2018 CLINICAL DATA:  Cough, fever and chills for 4 days. EXAM: CHEST - 2 VIEW COMPARISON:  PA and lateral chest 05/12/2016. FINDINGS: Lungs clear. Heart size normal. No pneumothorax or pleural fluid. No acute or focal bony abnormality. IMPRESSION: Negative chest. Electronically Signed   By: Inge Rise M.D.   On: 03/13/2018 13:45    Procedures Procedures (including critical care time)  Medications Ordered in UC Medications - No data to display  Initial Impression / Assessment and Plan / UC Course  I have reviewed the triage vital signs and the nursing notes.  Pertinent labs & imaging results that were available during my care of the patient were reviewed by me and considered in my medical  decision making (see chart for details).     No current chest pain. No current nausea, shortness of breath, dizziness. URI symptoms present for the past three days, afebrile. Chest xray reassuring today. Case reviewed and patient interviewed by supervising physician Dr. Lanny Cramp, agreeable with plan to treat as viral URI. Strict return precautions discussed. Patient verbalized understanding and agreeable to plan.  Ambulatory out of clinic without difficulty.    Final Clinical Impressions(s) / UC Diagnoses   Final diagnoses:  Upper respiratory tract infection, unspecified type     Discharge Instructions     Tylenol as needed for pain or chills.  Tessalon as needed for cough.  Rest.  Ensure adequate hydration to keep secretions thin.  Saline nasal spray may be helpful with congestion symptoms.  Please follow up with your primary care provider for recheck in the next 1-2 weeks.  If develop worsening of cough, fevers, shortness of breath , chest pain , weakness, dizziness, or otherwise worsening please go to the ER.     ED Prescriptions    Medication Sig Dispense Auth. Provider   benzonatate (TESSALON) 100 MG capsule Take 1 capsule (100 mg total) by mouth every 8 (eight) hours. 21 capsule Augusto Gamble B, NP   sodium chloride (OCEAN) 0.65 % SOLN nasal spray Place 1 spray into both nostrils as needed for congestion. [place]  mL Zigmund Gottron, NP     Controlled Substance Prescriptions [place] Controlled Substance Registry consulted? Not Applicable   Zigmund Gottron, NP 03/13/18 1421

## 2018-03-13 NOTE — Discharge Instructions (Addendum)
Tylenol as needed for pain or chills.  Tessalon as needed for cough.  Rest.  Ensure adequate hydration to keep secretions thin.  Saline nasal spray may be helpful with congestion symptoms.  Please follow up with your primary care provider for recheck in the next 1-2 weeks.  If develop worsening of cough, fevers, shortness of breath , chest pain , weakness, dizziness, or otherwise worsening please go to the ER.

## 2018-03-14 NOTE — Addendum Note (Signed)
Addended by: Ulice Brilliant T on: 03/14/2018 09:00 AM   Modules accepted: Orders

## 2018-03-16 ENCOUNTER — Ambulatory Visit (INDEPENDENT_AMBULATORY_CARE_PROVIDER_SITE_OTHER)
Admission: RE | Admit: 2018-03-16 | Discharge: 2018-03-16 | Disposition: A | Discharge status: Home / Self Care | Payer: 59 | Source: Ambulatory Visit | Attending: Family | Admitting: Family

## 2018-03-16 ENCOUNTER — Encounter: Payer: Self-pay | Admitting: Family

## 2018-03-16 ENCOUNTER — Ambulatory Visit (INDEPENDENT_AMBULATORY_CARE_PROVIDER_SITE_OTHER): Payer: 59 | Admitting: Family

## 2018-03-16 ENCOUNTER — Emergency Department (HOSPITAL_COMMUNITY)
Admission: EM | Admit: 2018-03-16 | Discharge: 2018-03-16 | Disposition: A | Discharge status: Home / Self Care | Payer: 59 | Attending: Emergency Medicine | Admitting: Emergency Medicine

## 2018-03-16 VITALS — BP 122/80 | HR 75 | Temp 97.9°F | Ht 65.0 in | Wt 257.1 lb

## 2018-03-16 DIAGNOSIS — J069 Acute upper respiratory infection, unspecified: Secondary | ICD-10-CM | POA: Diagnosis not present

## 2018-03-16 DIAGNOSIS — Z87891 Personal history of nicotine dependence: Secondary | ICD-10-CM | POA: Diagnosis not present

## 2018-03-16 DIAGNOSIS — Z794 Long term (current) use of insulin: Secondary | ICD-10-CM | POA: Insufficient documentation

## 2018-03-16 DIAGNOSIS — I2510 Atherosclerotic heart disease of native coronary artery without angina pectoris: Secondary | ICD-10-CM | POA: Insufficient documentation

## 2018-03-16 DIAGNOSIS — Z992 Dependence on renal dialysis: Secondary | ICD-10-CM | POA: Diagnosis not present

## 2018-03-16 DIAGNOSIS — N186 End stage renal disease: Secondary | ICD-10-CM | POA: Diagnosis not present

## 2018-03-16 DIAGNOSIS — Z7982 Long term (current) use of aspirin: Secondary | ICD-10-CM | POA: Diagnosis not present

## 2018-03-16 DIAGNOSIS — R05 Cough: Secondary | ICD-10-CM

## 2018-03-16 DIAGNOSIS — R059 Cough, unspecified: Secondary | ICD-10-CM

## 2018-03-16 DIAGNOSIS — Z79899 Other long term (current) drug therapy: Secondary | ICD-10-CM | POA: Diagnosis not present

## 2018-03-16 DIAGNOSIS — R0602 Shortness of breath: Secondary | ICD-10-CM | POA: Diagnosis not present

## 2018-03-16 DIAGNOSIS — I120 Hypertensive chronic kidney disease with stage 5 chronic kidney disease or end stage renal disease: Secondary | ICD-10-CM | POA: Insufficient documentation

## 2018-03-16 DIAGNOSIS — B9789 Other viral agents as the cause of diseases classified elsewhere: Secondary | ICD-10-CM

## 2018-03-16 LAB — COMPREHENSIVE METABOLIC PANEL
ALT: 21 U/L (ref 0–44)
AST: 24 U/L (ref 15–41)
Albumin: 3.3 g/dL — ABNORMAL LOW (ref 3.5–5.0)
Alkaline Phosphatase: 72 U/L (ref 38–126)
Anion gap: 18 — ABNORMAL HIGH (ref 5–15)
BUN: 47 mg/dL — ABNORMAL HIGH (ref 6–20)
CO2: 23 mmol/L (ref 22–32)
Calcium: 8.2 mg/dL — ABNORMAL LOW (ref 8.9–10.3)
Chloride: 95 mmol/L — ABNORMAL LOW (ref 98–111)
Creatinine, Ser: 16.29 mg/dL — ABNORMAL HIGH (ref 0.44–1.00)
GFR calc Af Amer: 3 mL/min — ABNORMAL LOW (ref >60)
GFR calc non Af Amer: 2 mL/min — ABNORMAL LOW (ref >60)
Glucose, Bld: 129 mg/dL — ABNORMAL HIGH (ref 70–99)
Potassium: 3.2 mmol/L — ABNORMAL LOW (ref 3.5–5.1)
Sodium: 136 mmol/L (ref 135–145)
Total Bilirubin: 0.6 mg/dL (ref 0.3–1.2)
Total Protein: 6.8 g/dL (ref 6.5–8.1)

## 2018-03-16 LAB — CBC WITH DIFFERENTIAL/PLATELET
Abs Immature Granulocytes: 0.02 10*3/uL (ref 0.00–0.07)
Basophils Absolute: 0 10*3/uL (ref 0.0–0.1)
Basophils Relative: 0 %
Eosinophils Absolute: 0.4 10*3/uL (ref 0.0–0.5)
Eosinophils Relative: 6 %
HCT: 37.9 % (ref 36.0–46.0)
Hemoglobin: 12.3 g/dL (ref 12.0–15.0)
Immature Granulocytes: 0 %
Lymphocytes Relative: 38 %
Lymphs Abs: 2.6 10*3/uL (ref 0.7–4.0)
MCH: 32.9 pg (ref 26.0–34.0)
MCHC: 32.5 g/dL (ref 30.0–36.0)
MCV: 101.3 fL — ABNORMAL HIGH (ref 80.0–100.0)
Monocytes Absolute: 0.9 10*3/uL (ref 0.1–1.0)
Monocytes Relative: 12 %
Neutro Abs: 3.1 10*3/uL (ref 1.7–7.7)
Neutrophils Relative %: 44 %
Platelets: 307 10*3/uL (ref 150–400)
RBC: 3.74 MIL/uL — ABNORMAL LOW (ref 3.87–5.11)
RDW: 15.2 % (ref 11.5–15.5)
WBC: 7 10*3/uL (ref 4.0–10.5)
nRBC: 0 % (ref 0.0–0.2)

## 2018-03-16 LAB — INFLUENZA PANEL BY PCR (TYPE A & B)
Influenza A By PCR: NEGATIVE
Influenza B By PCR: NEGATIVE

## 2018-03-16 MED ORDER — DOXYCYCLINE HYCLATE 100 MG PO CAPS: 100.0000 mg | ORAL_CAPSULE | Freq: Two times a day (BID) | ORAL | 0 refills | Status: AC

## 2018-03-16 NOTE — Discharge Instructions (Signed)
Take doxycycline as directed. Return to the ED for worsening symptoms, fever, increased chest pain, shortness of breath or leg swelling.

## 2018-03-16 NOTE — Progress Notes (Signed)
Brenda Davis is a 53 y.o. female with the following history as recorded in EpicCare:  Patient Active Problem List   Diagnosis Date Noted  . Elevated troponin 03/08/2018  . Folate deficiency 03/08/2018  . CAD in native artery   . Hepatic cirrhosis ([place]) 11/24/2017  . Omental infarction ([place]) 08/25/2017  . ESRD on peritoneal dialysis ([place]) 08/25/2017  . DM (diabetes mellitus), type 2 with renal complications ([place]) 17/51/0258  . Deficiency anemia 08/24/2017  . Elevated lipase 08/24/2017  . Chronic heart failure with preserved ejection fraction ([place]) 06/22/2017  . Hypertension 09/15/2016  . Sensorineural hearing loss (SNHL), bilateral 06/29/2016  . Allergic rhinitis 06/27/2016  . Severe persistent asthma with exacerbation 05/12/2016  . Anemia of chronic disease 05/06/2016  . Routine general medical examination at a health care facility 12/05/2015  . Cervical cancer screening 12/05/2015  . Visit for screening mammogram 01/23/2015  . Sinus pause 01/04/2015  . Gout due to renal impairment 09/13/2014  . Thiamine deficiency 12/02/2012  . Meralgia paraesthetica 11/28/2012  . S/P laparoscopic sleeve gastrectomy 11/28/2012  . B12 deficiency anemia 11/28/2012  . Morbid obesity with BMI of 40.0-44.9, adult ([place]) 07/22/2012  . Dyslipidemia, goal LDL below 70 04/09/2008  . CKD (chronic kidney disease) stage V requiring chronic dialysis (MWF) 02/08/2008    Current Outpatient Medications  Medication Sig Dispense Refill  . acetaminophen (TYLENOL) 325 MG tablet Take 650 mg by mouth every 6 (six) hours as needed for mild pain.    Marland Kitchen aspirin EC 81 MG EC tablet Take 1 tablet (81 mg total) by mouth daily. 30 tablet 0  . atorvastatin (LIPITOR) 80 MG tablet Take 1 tablet (80 mg total) by mouth daily at 6 PM. 30 tablet 0  . AURYXIA 1 GM 210 MG(Fe) tablet Take 210 mg by mouth 3 (three) times daily with meals.   3  . benzonatate (TESSALON) 100 MG capsule Take 1 capsule (100 mg total) by mouth  every 8 (eight) hours. 21 capsule 0  . calcitRIOL (ROCALTROL) 0.5 MCG capsule Take 0.5 mcg by mouth daily.    . calcium acetate (PHOSLO) 667 MG capsule Take 2 capsules (1,334 mg total) by mouth 3 (three) times daily with meals. 180 capsule 0  . carvedilol (COREG) 3.125 MG tablet Take 1 tablet (3.125 mg total) by mouth 2 (two) times daily with a meal. 60 tablet 0  . cinacalcet (SENSIPAR) 30 MG tablet Take 2 tablets (60 mg total) by mouth daily with supper. 30 tablet 0  . Cyanocobalamin (B-12 PO) Take 1 tablet by mouth daily.    . folic acid (FOLVITE) 1 MG tablet Take 1 tablet (1 mg total) by mouth daily. 90 tablet 1  . gentamicin cream (GARAMYCIN) 0.1 % Apply 1 application topically See admin instructions. Apply to exit site daily as directed.  4  . insulin aspart (NOVOLOG) 100 UNIT/ML FlexPen Inject 10 Units into the skin 3 (three) times daily with meals. 15 mL 5  . Insulin Glargine (BASAGLAR KWIKPEN) 100 UNIT/ML SOPN Inject 0.5 mLs (50 Units total) into the skin daily. 9 mL 1  . Insulin Pen Needle (NOVOFINE) 32G X 6 MM MISC 1 Act by Does not apply route 4 (four) times daily -  with meals and at bedtime. 300 each 1  . Multiple Vitamins-Minerals (PRORENAL + D) TABS Take 1 tablet by mouth daily.  2  . multivitamin (RENA-VIT) TABS tablet Take 1 tablet by mouth at bedtime. 30 tablet 0  . sodium chloride (OCEAN) 0.65 %  SOLN nasal spray Place 1 spray into both nostrils as needed for congestion. 60 mL 0  . thiamine (VITAMIN B-1) 100 MG tablet Take 1 tablet (100 mg total) by mouth daily. 90 tablet 1   No current facility-administered medications for this visit.     Allergies: Amlodipine; Clonidine derivatives; and Welchol [colesevelam hcl]  Past Medical History:  Diagnosis Date  . Anemia   . Chronic kidney disease    STAGE 3 CHRONIC KIDNEY DISEASE SECONDARY TO DIABETIC GLOMERULOSCLEROSIS AND UNCONTROLLED HYPERTENSION - PER OFFICE NOTES DR. Fl[facility]ne [facility].  Marland Kitchen ESRD on dialysis [facility])     "MWF; [address] (05/06/2016)  . Gout   . HCAP (healthcare-associated pneumonia) 05/06/2016  . History of blood transfusion    "related to surgery"  . Hyperlipidemia   . Hypertension   . Morbid obesity ([place])   . Pain    LEFT SHOULDER PAIN - WAS SEEN AT AN URGENT CARE - GIVEN SLING FOR COMFORT AND TOLD ROM AS TOLERATED.  Marland Kitchen Palpitations 09/24/2016  . Type II diabetes mellitus ([place])    "gastric sleeve OR corrected this" (05/06/2016)    Past Surgical History:  Procedure Laterality Date  . AV FISTULA PLACEMENT Left 01/03/2015   Procedure: BRACHIAL CEPHALIC ARTERIOVENOUS  FISTULA CREATION LEFT ARM;  Surgeon: Angelia Mould, MD;  Location: [place];  Service: Vascular;  Laterality: Left;  . CARPAL TUNNEL RELEASE Right   . [place]  . CHOLECYSTECTOMY  09/14/2017  . ESOPHAGOGASTRODUODENOSCOPY N/A 09/04/2012   Procedure: ESOPHAGOGASTRODUODENOSCOPY (EGD);  Surgeon: Shann Medal, MD;  Location: [facility];  Service: General;  Laterality: N/A;  PF  . ESOPHAGOGASTRODUODENOSCOPY (EGD) WITH ESOPHAGEAL DILATION N/A 09/29/2012   Procedure: ESOPHAGOGASTRODUODENOSCOPY (EGD) WITH ESOPHAGEAL DILATION;  Surgeon: Milus Banister, MD;  [facility];  Service: Endoscopy;  Laterality: N/A;  . INSERTION OF DIALYSIS CATHETER N/A 01/03/2015   Procedure: INSERTION OF DIALYSIS CATHETER RIGHT INTERNAL JUGULAR;  Surgeon: Angelia Mould, MD;  Location: [place];  Service: Vascular;  Laterality: N/A;  . LAPAROSCOPIC GASTRIC SLEEVE RESECTION N/A 07/19/2012   Procedure: LAPAROSCOPIC SLEEVE GASTRECTOMY with EGD;  Surgeon: Madilyn Hook, DO;  [facility];  Service: General;  Laterality: N/A;  laparoscopic sleeve gastrectomy with EGD  . LEFT HEART CATH AND CORONARY ANGIOGRAPHY N/A 02/25/2018   Procedure: LEFT HEART CATH AND CORONARY ANGIOGRAPHY;  Surgeon: Leonie Man, MD;  Location: [place] [facility];  Service: Cardiovascular;  Laterality: N/A;  . PERIPHERAL VASCULAR  CATHETERIZATION Left 05/23/2015   Procedure: Nolon Stalls;  Surgeon: Conrad [place], MD;  Location: [place] [facility];  Service: Cardiovascular;  Laterality: Left;  upper aRM  . PERIPHERAL VASCULAR CATHETERIZATION Left 05/23/2015   Procedure: Peripheral Vascular Balloon Angioplasty;  Surgeon: Conrad Belton, MD;  Location: [place] [facility];  Service: Cardiovascular;  Laterality: Left;  av fistula  . TUBAL LIGATION  1993  . UPPER GI ENDOSCOPY N/A 07/19/2012   Procedure: UPPER GI ENDOSCOPY;  Surgeon: Madilyn Hook, DO;  [facility];  Service: General;  Laterality: N/A;    Family History  Problem Relation Age of Onset  . Hypertension Mother   . Mitral valve prolapse Mother   . Diabetes Father   . Arthritis Other   . Hyperlipidemia Other   . Cancer Neg Hx   . Heart disease Neg Hx   . Kidney disease Neg Hx   . Stroke Neg Hx     Social History   Tobacco Use  .  Smoking status: Former Smoker    Packs/day: 1.00    Years: 16.00    Pack years: 16.00    Types: Cigarettes    Last attempt to quit: 2009    Years since quitting: 11.1  . Smokeless tobacco: Never Used  Substance Use Topics  . Alcohol use: Never    Frequency: Never    Subjective:  Patient presents with persisting cough/ congestion; started last Thursday seen at [facility] 2 days and had normal CXR; was thought to be viral URI but symptoms have persisted. Was discharged on saline and Tessalon perles- has not used this medication; notes that she is just having more and more difficulty breathing/ feeling very tired/ light- headed; noted that her blood pressure was lower than normal this morning after dialysis last night.  Objective:  Vitals:   03/16/18 1100  BP: 122/80  Pulse: 75  Temp: 97.9 F (36.6 C)  TempSrc: Oral  SpO2: 94%  Weight: 257 lb 1.3 oz (116.6 kg)  Height: 5\' 5"  (1.651 m)    General: Well developed, well nourished, in no acute distress; weak appearing Skin : Warm and dry.  Head: Normocephalic and atraumatic  Eyes:  Sclera and conjunctiva clear; pupils round and reactive to light; extraocular movements intact  Ears: External normal; canals clear; tympanic membranes normal  Oropharynx: Pink, supple. No suspicious lesions  Neck: Supple without thyromegaly, adenopathy  Lungs: Respirations unlabored; coarse breath sounds CVS exam: normal rate and regular rhythm.  Abdomen: Soft; nontender; nondistended; normoactive bowel sounds; no masses or hepatosplenomegaly  Musculoskeletal: No deformities; no active joint inflammation  Extremities: No edema, cyanosis, clubbing  Vessels: Symmetric bilaterally  Neurologic: Alert and oriented; speech intact; face symmetrical; moves all extremities well; CNII-XII intact without focal deficit   Assessment:  1. Cough   2. Shortness of breath     Plan:  Update CXR today- ? Early pneumonia; based on patient's appearance and other chronic care needs, am concerned that she needs to be admitted. She is in agreement and will go to [facility] for further evaluation.   No follow-ups on file.  Orders Placed This Encounter  Procedures  . DG Chest 2 View    Standing Status:   Future    Number of Occurrences:   1    Standing Expiration Date:   05/15/2019    Order Specific Question:   Reason for Exam (SYMPTOM  OR DIAGNOSIS REQUIRED)    Answer:   cough/ shortness of breath    Order Specific Question:   Is patient pregnant?    Answer:   No    Order Specific Question:   Preferred imaging location?    Answer:   [facility]    Order Specific Question:   Radiology Contrast Protocol - do NOT remove file path    Answer:   [email or web address]    Requested Prescriptions    No prescriptions requested or ordered in this encounter

## 2018-03-16 NOTE — ED Notes (Signed)
Pt ambulated on pulse ox. Before ambulating pt sat was at 99%, while ambulating sat dropped to 96%. Pt stated she was out of breath while walking.

## 2018-03-16 NOTE — Progress Notes (Signed)
Concern for early pneumonia; based on weakness, breathing issues at time of OV, she will go to ER.

## 2018-03-16 NOTE — ED Triage Notes (Signed)
Pt in c/o pneumonia shown on an outpatient xray, PCP told her to come in for further evaluation, no distress noted

## 2018-03-16 NOTE — ED Provider Notes (Signed)
[place] EMERGENCY DEPARTMENT Provider Note   CSN: [id number] Arrival date & time: 03/16/18  1306    History   Chief Complaint Chief Complaint  Patient presents with  . Pneumonia    HPI Brenda Davis is a 53 y.o. female with a past medical history of ESRD on peritoneal dialysis, CAD, who presents to ED for 1 week history of URI symptoms including cough productive with mucus, generalized body aches, chills, rhinorrhea.  She was seen and evaluated at urgent care on 03/13/2018 with negative chest x-ray and discharged home with supportive medicine nurse.  She has been completing peritoneal dialysis nightly without issues.  She was seen and evaluated by her PCP today, found to have chest x-ray showing "possible early pneumonia" and sent to the ED for further evaluation.  She denies any sick contacts with similar symptoms.  Denies any shortness of breath, chest pain, abdominal pain, diarrhea.  She does note several episodes of vomiting that has been nonbloody and nonbilious.     HPI  Past Medical History:  Diagnosis Date  . Anemia   . Chronic kidney disease    STAGE 3 CHRONIC KIDNEY DISEASE SECONDARY TO DIABETIC GLOMERULOSCLEROSIS AND UNCONTROLLED HYPERTENSION - PER OFFICE NOTES DR. [facility].  Marland Kitchen ESRD on dialysis [facility])    "MWF; [address] (05/06/2016)  . Gout   . HCAP (healthcare-associated pneumonia) 05/06/2016  . History of blood transfusion    "related to surgery"  . Hyperlipidemia   . Hypertension   . Morbid obesity ([place])   . Pain    LEFT SHOULDER PAIN - WAS SEEN AT AN URGENT CARE - GIVEN SLING FOR COMFORT AND TOLD ROM AS TOLERATED.  Marland Kitchen Palpitations 09/24/2016  . Type II diabetes mellitus ([place])    "gastric sleeve OR corrected this" (05/06/2016)    Patient Active Problem List   Diagnosis Date Noted  . Elevated troponin 03/08/2018  . Folate deficiency 03/08/2018  . CAD in native artery   . Hepatic cirrhosis ([place])  11/24/2017  . Omental infarction ([place]) 08/25/2017  . ESRD on peritoneal dialysis ([place]) 08/25/2017  . DM (diabetes mellitus), type 2 with renal complications ([place]) 23/76/2831  . Deficiency anemia 08/24/2017  . Elevated lipase 08/24/2017  . Chronic heart failure with preserved ejection fraction ([place]) 06/22/2017  . Hypertension 09/15/2016  . Sensorineural hearing loss (SNHL), bilateral 06/29/2016  . Allergic rhinitis 06/27/2016  . Severe persistent asthma with exacerbation 05/12/2016  . Anemia of chronic disease 05/06/2016  . Routine general medical examination at a health care facility 12/05/2015  . Cervical cancer screening 12/05/2015  . Visit for screening mammogram 01/23/2015  . Sinus pause 01/04/2015  . Gout due to renal impairment 09/13/2014  . Thiamine deficiency 12/02/2012  . Meralgia paraesthetica 11/28/2012  . S/P laparoscopic sleeve gastrectomy 11/28/2012  . B12 deficiency anemia 11/28/2012  . Morbid obesity with BMI of 40.0-44.9, adult ([place]) 07/22/2012  . Dyslipidemia, goal LDL below 70 04/09/2008  . CKD (chronic kidney disease) stage V requiring chronic dialysis (MWF) 02/08/2008    Past Surgical History:  Procedure Laterality Date  . AV FISTULA PLACEMENT Left 01/03/2015   Procedure: BRACHIAL CEPHALIC ARTERIOVENOUS  FISTULA CREATION LEFT ARM;  Surgeon: Angelia Mould, MD;  Location: [place];  Service: Vascular;  Laterality: Left;  . CARPAL TUNNEL RELEASE Right   . [place]  . CHOLECYSTECTOMY  09/14/2017  . ESOPHAGOGASTRODUODENOSCOPY N/A 09/04/2012   Procedure: ESOPHAGOGASTRODUODENOSCOPY (EGD);  Surgeon: Shann Medal, MD;  [facility];  Service: General;  Laterality: N/A;  PF  . ESOPHAGOGASTRODUODENOSCOPY (EGD) WITH ESOPHAGEAL DILATION N/A 09/29/2012   Procedure: ESOPHAGOGASTRODUODENOSCOPY (EGD) WITH ESOPHAGEAL DILATION;  Surgeon: Milus Banister, MD;  [facility];  Service: Endoscopy;  Laterality: N/A;  . INSERTION OF  DIALYSIS CATHETER N/A 01/03/2015   Procedure: INSERTION OF DIALYSIS CATHETER RIGHT INTERNAL JUGULAR;  Surgeon: Angelia Mould, MD;  Location: [place];  Service: Vascular;  Laterality: N/A;  . LAPAROSCOPIC GASTRIC SLEEVE RESECTION N/A 07/19/2012   Procedure: LAPAROSCOPIC SLEEVE GASTRECTOMY with EGD;  Surgeon: Madilyn Hook, DO;  [facility];  Service: General;  Laterality: N/A;  laparoscopic sleeve gastrectomy with EGD  . LEFT HEART CATH AND CORONARY ANGIOGRAPHY N/A 02/25/2018   Procedure: LEFT HEART CATH AND CORONARY ANGIOGRAPHY;  Surgeon: Leonie Man, MD;  Location: [place] [facility];  Service: Cardiovascular;  Laterality: N/A;  . PERIPHERAL VASCULAR CATHETERIZATION Left 05/23/2015   Procedure: Nolon Stalls;  Surgeon: Conrad Ensign, MD;  Location: [place] CV LAB;  Service: Cardiovascular;  Laterality: Left;  upper aRM  . PERIPHERAL VASCULAR CATHETERIZATION Left 05/23/2015   Procedure: Peripheral Vascular Balloon Angioplasty;  Surgeon: Conrad Golconda, MD;  Location: [place] CV LAB;  Service: Cardiovascular;  Laterality: Left;  av fistula  . TUBAL LIGATION  1993  . UPPER GI ENDOSCOPY N/A 07/19/2012   Procedure: UPPER GI ENDOSCOPY;  Surgeon: Madilyn Hook, DO;  [facility];  Service: General;  Laterality: N/A;     OB History   No obstetric history on file.      Home Medications    Prior to Admission medications   Medication Sig Start Date End Date Taking? Authorizing Provider  acetaminophen (TYLENOL) 325 MG tablet Take 650 mg by mouth every 6 (six) hours as needed for mild pain.    [provider]  aspirin EC 81 MG EC tablet Take 1 tablet (81 mg total) by mouth daily. 02/26/18   Norval Morton, MD  atorvastatin (LIPITOR) 80 MG tablet Take 1 tablet (80 mg total) by mouth daily at 6 PM. 02/26/18   Norval Morton, MD  AURYXIA 1 GM 210 MG(Fe) tablet Take 210 mg by mouth 3 (three) times daily with meals.  04/07/17   [provider]  benzonatate (TESSALON) 100 MG  capsule Take 1 capsule (100 mg total) by mouth every 8 (eight) hours. 03/13/18   Zigmund Gottron, NP  calcitRIOL (ROCALTROL) 0.5 MCG capsule Take 0.5 mcg by mouth daily. 12/24/17   [provider]  calcium acetate (PHOSLO) 667 MG capsule Take 2 capsules (1,334 mg total) by mouth 3 (three) times daily with meals. 01/07/15   Eugenie Filler, MD  carvedilol (COREG) 3.125 MG tablet Take 1 tablet (3.125 mg total) by mouth 2 (two) times daily with a meal. 02/26/18   Norval Morton, MD  cinacalcet (SENSIPAR) 30 MG tablet Take 2 tablets (60 mg total) by mouth daily with supper. 02/26/18   Norval Morton, MD  Cyanocobalamin (B-12 PO) Take 1 tablet by mouth daily.    [provider]  doxycycline (VIBRAMYCIN) 100 MG capsule Take 1 capsule (100 mg total) by mouth 2 (two) times daily for 7 days. 03/16/18 03/23/18  Zamyra Allensworth, PA-C  folic acid (FOLVITE) 1 MG tablet Take 1 tablet (1 mg total) by mouth daily. 03/08/18   [name], MD  gentamicin cream (GARAMYCIN) 0.1 % Apply 1 application topically See admin instructions. Apply to exit site daily as  directed. 04/26/17   [provider]  insulin aspart (NOVOLOG) 100 UNIT/ML FlexPen Inject 10 Units into the skin 3 (three) times daily with meals. 12/01/17   [name], MD  Insulin Glargine (BASAGLAR KWIKPEN) 100 UNIT/ML SOPN Inject 0.5 mLs (50 Units total) into the skin daily. 12/01/17   [name], MD  Insulin Pen Needle (NOVOFINE) 32G X 6 MM MISC 1 Act by Does not apply route 4 (four) times daily -  with meals and at bedtime. 11/22/17   [name], MD  Multiple Vitamins-Minerals (PRORENAL + D) TABS Take 1 tablet by mouth daily. 10/29/17   [provider]  multivitamin (RENA-VIT) TABS tablet Take 1 tablet by mouth at bedtime. 01/07/15   Eugenie Filler, MD  sodium chloride (OCEAN) 0.65 % SOLN nasal spray Place 1 spray into both nostrils as needed for congestion. 03/13/18   Zigmund Gottron, NP  thiamine (VITAMIN  B-1) 100 MG tablet Take 1 tablet (100 mg total) by mouth daily. 06/21/17   [name], MD    Family History Family History  Problem Relation Age of Onset  . Hypertension Mother   . Mitral valve prolapse Mother   . Diabetes Father   . Arthritis Other   . Hyperlipidemia Other   . Cancer Neg Hx   . Heart disease Neg Hx   . Kidney disease Neg Hx   . Stroke Neg Hx     Social History Social History   Tobacco Use  . Smoking status: Former Smoker    Packs/day: 1.00    Years: 16.00    Pack years: 16.00    Types: Cigarettes    Last attempt to quit: 2009    Years since quitting: 11.1  . Smokeless tobacco: Never Used  Substance Use Topics  . Alcohol use: Never    Frequency: Never  . Drug use: No     Allergies   Amlodipine; Clonidine derivatives; and Welchol [colesevelam hcl]   Review of Systems Review of Systems  Constitutional: Positive for chills. Negative for appetite change and fever.  HENT: Positive for sneezing. Negative for ear pain, rhinorrhea and sore throat.   Eyes: Negative for photophobia and visual disturbance.  Respiratory: Positive for cough. Negative for chest tightness, shortness of breath and wheezing.   Cardiovascular: Negative for chest pain and palpitations.  Gastrointestinal: Positive for vomiting. Negative for abdominal pain, blood in stool, constipation, diarrhea and nausea.  Genitourinary: Negative for dysuria, hematuria and urgency.  Musculoskeletal: Positive for myalgias.  Skin: Negative for rash.  Neurological: Negative for dizziness, weakness and light-headedness.     Physical Exam Updated Vital Signs BP 103/65   Pulse 82   Temp 98.5 F (36.9 C) (Oral)   Resp 20   Ht 5\' 5"  (1.651 m)   Wt 115.2 kg   SpO2 99%   BMI 42.27 kg/m   Physical Exam Vitals signs and nursing note reviewed.  Constitutional:      General: She is not in acute distress.    Appearance: She is well-developed.  HENT:     Head: Normocephalic and atraumatic.       Nose: Nose normal.  Eyes:     General: No scleral icterus.       Left eye: No discharge.     Conjunctiva/sclera: Conjunctivae normal.  Neck:     Musculoskeletal: Normal range of motion and neck supple.  Cardiovascular:     Rate and Rhythm: Normal rate and regular rhythm.  Heart sounds: Normal heart sounds. No murmur. No friction rub. No gallop.   Pulmonary:     Effort: Pulmonary effort is normal. No respiratory distress.     Breath sounds: Normal breath sounds.  Abdominal:     General: Bowel sounds are normal. There is no distension.     Palpations: Abdomen is soft.     Tenderness: There is no abdominal tenderness. There is no guarding.  Musculoskeletal: Normal range of motion.  Skin:    General: Skin is warm and dry.     Findings: No rash.  Neurological:     Mental Status: She is alert.     Motor: No abnormal muscle tone.     Coordination: Coordination normal.      ED Treatments / Results  Labs (all labs ordered are listed, but only abnormal results are displayed) Labs Reviewed  COMPREHENSIVE METABOLIC PANEL - Abnormal; Notable for the following components:      Result Value   Potassium 3.2 (*)    Chloride 95 (*)    Glucose, Bld 129 (*)    BUN 47 (*)    Creatinine, Ser 16.29 (*)    Calcium 8.2 (*)    Albumin 3.3 (*)    GFR calc non Af Amer 2 (*)    GFR calc Af Amer 3 (*)    Anion gap 18 (*)    All other components within normal limits  CBC WITH DIFFERENTIAL/PLATELET - Abnormal; Notable for the following components:   RBC 3.74 (*)    MCV 101.3 (*)    All other components within normal limits  INFLUENZA PANEL BY PCR (TYPE A & B)    EKG None  Radiology Dg Chest 2 View  Result Date: 03/16/2018 CLINICAL DATA:  Cough, shortness of breath. EXAM: CHEST - 2 VIEW COMPARISON:  Radiographs of March 13, 2018. FINDINGS: The heart size and mediastinal contours are within normal limits. No pneumothorax or pleural effusion is noted. Left lung is clear. Right  middle lobe subsegmental atelectasis is noted. The visualized skeletal structures are unremarkable. IMPRESSION: Right middle lobe subsegmental atelectasis. Electronically Signed   By: Marijo Conception, M.D.   On: 03/16/2018 12:05    Procedures Procedures (including critical care time)  Medications Ordered in ED Medications - No data to display   Initial Impression / Assessment and Plan / ED Course  I have reviewed the triage vital signs and the nursing notes.  Pertinent labs & imaging results that were available during my care of the patient were reviewed by me and considered in my medical decision making (see chart for details).        53 year old female with a past medical history of ESRD on peritoneal dialysis, CAD who presents to ED for pneumonia from PCPs office.  She has been having URI symptoms including chills, cough productive with mucus, body aches for the past week.  Seen and evaluated at urgent care 3 days ago, negative chest x-ray.  She followed up with her PCP today and sent her to the ED for further evaluation.  Patient is afebrile with no recent use of antipyretics.  X-ray done at PCPs office shows possible right middle lobe atelectasis.  There is concern for early pneumonia.  CBC is unremarkable.  CMP with values compatible with her known ESRD.  Flu swab is negative.  Her vital signs remained normal here.  She ambulated here with oxygen saturations above 96% throughout.  Will discharge her with antibiotics and have her follow-up  with her PCP.  I do not feel that admission is necessary at this time as her vital signs are reassuring, she is not hypoxic and her lab work is reassuring as well.  She is agreeable to this plan.  Patient is hemodynamically stable, in NAD, and able to ambulate in the ED. Evaluation does not show pathology that would require ongoing emergent intervention or inpatient treatment. I explained the diagnosis to the patient. Pain has been managed and has no  complaints prior to discharge. Patient is comfortable with above plan and is stable for discharge at this time. All questions were answered prior to disposition. Strict return precautions for returning to the ED were discussed. Encouraged follow up with PCP.    Portions of this note were generated with [employer]. Dictation errors may occur despite best attempts at proofreading.   Final Clinical Impressions(s) / ED Diagnoses   Final diagnoses:  Viral URI with cough    ED Discharge Orders         Ordered    doxycycline (VIBRAMYCIN) 100 MG capsule  2 times daily     03/16/18 2234           Delia Heady, PA-C 03/16/18 2241    Margette Fast, MD 03/17/18 [phone]

## 2018-03-16 NOTE — ED Notes (Signed)
Pt given discharge instructions. Pt verbalizes understand and teach back displayed.

## 2018-03-17 ENCOUNTER — Ambulatory Visit: Payer: Self-pay | Admitting: *Deleted

## 2018-03-17 ENCOUNTER — Other Ambulatory Visit: Payer: Self-pay | Admitting: Internal Medicine

## 2018-03-17 NOTE — Telephone Encounter (Signed)
She should stop taking the doxy

## 2018-03-17 NOTE — Telephone Encounter (Signed)
Pt is having side effects (vomiting) from the abx (doxycycline) that was rx'ed from the ED provider that saw her. Does the abx need to be changed? Dx from ED is viral URI. Please advise

## 2018-03-17 NOTE — Telephone Encounter (Signed)
Pt calling with complaints of having 4 episodes of vomiting after taking Doxycyline. Pt states she was prescribed doxycycline last night by an ED physician for treatment of an upper respiratory infection on yesterday. Pt was also seen for an OV with Jodi Mourning, NP on yesterday. Pt states she took the Doxycyline without anything to eat this morning and also states she did not eat anything on yesterday due to having a decreased appetite. Pt also had an episode of vomiting while on the phone with triage nurse.  Pt voiced no other symptoms at this time. Pt can be contacted at [phone] with recommendations.   Reason for Disposition . Vomiting a prescription medication  Answer Assessment - Initial Assessment Questions 1. VOMITING SEVERITY: "How many times have you vomited in the past 24 hours?"     - MILD:  1 - 2 times/day    - MODERATE: 3 - 5 times/day, decreased oral intake without significant weight loss or symptoms of dehydration    - SEVERE: 6 or more times/day, vomits everything or nearly everything, with significant weight loss, symptoms of dehydration      4 times today 2. ONSET: "When did the vomiting begin?"      About 40 min prior to calling the office 3. FLUIDS: "What fluids or food have you vomited up today?" "Have you been able to keep any fluids down?"     Like yellow and has not had anything to eat today or yesterday 4. ABDOMINAL PAIN: "Are your having any abdominal pain?" If yes : "How bad is it and what does it feel like?" (e.g., crampy, dull, intermittent, constant)      No 5. DIARRHEA: "Is there any diarrhea?" If so, ask: "How many times today?"      No 6. CAUSE: "What do you think is causing your vomiting?"     Recently prescribed doxycycline for an upper resp infection 7. OTHER SYMPTOMS: "Do you have any other symptoms?" (e.g., fever, headache, vertigo, vomiting blood or coffee grounds, recent head injury)     No 8. PREGNANCY: "Is there any chance you are pregnant?" "When  was your last menstrual period?"       No pregnancy and no longer has menstrual cycles  Protocols used: [facility]

## 2018-03-18 NOTE — Telephone Encounter (Signed)
Pt contacted and informed to stop the doxy and that the viral URI will take some time for it run its course. Informed patient to call back if her symptoms persist.

## 2018-03-27 ENCOUNTER — Emergency Department (HOSPITAL_COMMUNITY)
Admission: EM | Admit: 2018-03-27 | Discharge: 2018-03-28 | Disposition: A | Discharge status: Home / Self Care | Payer: 59 | Attending: Emergency Medicine | Admitting: Emergency Medicine

## 2018-03-27 ENCOUNTER — Emergency Department (HOSPITAL_COMMUNITY): Payer: 59

## 2018-03-27 ENCOUNTER — Other Ambulatory Visit: Payer: Self-pay

## 2018-03-27 ENCOUNTER — Encounter (HOSPITAL_COMMUNITY): Payer: Self-pay | Admitting: Emergency Medicine

## 2018-03-27 DIAGNOSIS — I509 Heart failure, unspecified: Secondary | ICD-10-CM | POA: Insufficient documentation

## 2018-03-27 DIAGNOSIS — E1122 Type 2 diabetes mellitus with diabetic chronic kidney disease: Secondary | ICD-10-CM | POA: Diagnosis not present

## 2018-03-27 DIAGNOSIS — Y732 Prosthetic and other implants, materials and accessory gastroenterology and urology devices associated with adverse incidents: Secondary | ICD-10-CM | POA: Insufficient documentation

## 2018-03-27 DIAGNOSIS — R1012 Left upper quadrant pain: Secondary | ICD-10-CM | POA: Diagnosis present

## 2018-03-27 DIAGNOSIS — Z992 Dependence on renal dialysis: Secondary | ICD-10-CM | POA: Diagnosis not present

## 2018-03-27 DIAGNOSIS — T8571XA Infection and inflammatory reaction due to peritoneal dialysis catheter, initial encounter: Secondary | ICD-10-CM | POA: Diagnosis not present

## 2018-03-27 DIAGNOSIS — Z7982 Long term (current) use of aspirin: Secondary | ICD-10-CM | POA: Insufficient documentation

## 2018-03-27 DIAGNOSIS — N186 End stage renal disease: Secondary | ICD-10-CM

## 2018-03-27 DIAGNOSIS — Z79899 Other long term (current) drug therapy: Secondary | ICD-10-CM | POA: Insufficient documentation

## 2018-03-27 DIAGNOSIS — I2510 Atherosclerotic heart disease of native coronary artery without angina pectoris: Secondary | ICD-10-CM | POA: Diagnosis not present

## 2018-03-27 DIAGNOSIS — I132 Hypertensive heart and chronic kidney disease with heart failure and with stage 5 chronic kidney disease, or end stage renal disease: Secondary | ICD-10-CM | POA: Insufficient documentation

## 2018-03-27 DIAGNOSIS — Z87891 Personal history of nicotine dependence: Secondary | ICD-10-CM | POA: Insufficient documentation

## 2018-03-27 LAB — CBC
HCT: 37.5 % (ref 36.0–46.0)
Hemoglobin: 11.8 g/dL — ABNORMAL LOW (ref 12.0–15.0)
MCH: 32.2 pg (ref 26.0–34.0)
MCHC: 31.5 g/dL (ref 30.0–36.0)
MCV: 102.2 fL — ABNORMAL HIGH (ref 80.0–100.0)
Platelets: 325 10*3/uL (ref 150–400)
RBC: 3.67 MIL/uL — ABNORMAL LOW (ref 3.87–5.11)
RDW: 14.6 % (ref 11.5–15.5)
WBC: 10 10*3/uL (ref 4.0–10.5)
nRBC: 0 % (ref 0.0–0.2)

## 2018-03-27 LAB — COMPREHENSIVE METABOLIC PANEL
ALT: 22 U/L (ref 0–44)
AST: 18 U/L (ref 15–41)
Albumin: 3.1 g/dL — ABNORMAL LOW (ref 3.5–5.0)
Alkaline Phosphatase: 70 U/L (ref 38–126)
Anion gap: 13 (ref 5–15)
BUN: 42 mg/dL — ABNORMAL HIGH (ref 6–20)
CO2: 25 mmol/L (ref 22–32)
Calcium: 8.2 mg/dL — ABNORMAL LOW (ref 8.9–10.3)
Chloride: 97 mmol/L — ABNORMAL LOW (ref 98–111)
Creatinine, Ser: 13.55 mg/dL — ABNORMAL HIGH (ref 0.44–1.00)
GFR calc Af Amer: 3 mL/min — ABNORMAL LOW (ref >60)
GFR calc non Af Amer: 3 mL/min — ABNORMAL LOW (ref >60)
Glucose, Bld: 146 mg/dL — ABNORMAL HIGH (ref 70–99)
Potassium: 3.7 mmol/L (ref 3.5–5.1)
Sodium: 135 mmol/L (ref 135–145)
Total Bilirubin: 0.6 mg/dL (ref 0.3–1.2)
Total Protein: 6.6 g/dL (ref 6.5–8.1)

## 2018-03-27 LAB — LIPASE, BLOOD: Lipase: 35 U/L (ref 11–51)

## 2018-03-27 LAB — I-STAT BETA HCG BLOOD, ED (MC, WL, AP ONLY): I-stat hCG, quantitative: 8.1 m[IU]/mL — ABNORMAL HIGH (ref <5)

## 2018-03-27 MED ORDER — HYDROMORPHONE HCL 1 MG/ML IJ SOLN
0.5000 mg | Freq: Once | INTRAMUSCULAR | Status: AC
  Administered 2018-03-27: 0.5 mg via INTRAVENOUS
  Filled 2018-03-27: qty 1

## 2018-03-27 MED ORDER — IOHEXOL 300 MG/ML  SOLN
100.0000 mL | Freq: Once | INTRAMUSCULAR | Status: AC | PRN
  Administered 2018-03-27: 100 mL via INTRAVENOUS

## 2018-03-27 MED ORDER — ONDANSETRON 4 MG PO TBDP
4.0000 mg | ORAL_TABLET | Freq: Once | ORAL | Status: DC
  Filled 2018-03-27: qty 1

## 2018-03-27 MED ORDER — SODIUM CHLORIDE 0.9% FLUSH
3.0000 mL | Freq: Once | INTRAVENOUS | Status: AC
  Administered 2018-03-27: 3 mL via INTRAVENOUS

## 2018-03-27 MED ORDER — ONDANSETRON HCL 4 MG/2ML IJ SOLN
4.0000 mg | Freq: Once | INTRAMUSCULAR | Status: AC
  Administered 2018-03-27: 4 mg via INTRAVENOUS
  Filled 2018-03-27: qty 2

## 2018-03-27 NOTE — ED Provider Notes (Signed)
[facility] EMERGENCY DEPARTMENT Provider Note   CSN: [id number] Arrival date & time: 03/27/18  1514    History   Chief Complaint Chief Complaint  Patient presents with  . Abdominal Pain    HPI LATRICIA CERRITO is a 53 y.o. female.     Patient is a peritoneal dialysis patient.  Patient started with some left upper quadrant abdominal pain on Friday.  Associated with little bit of nausea no vomiting.  No pain anywhere else no pain around the dialysis catheter which is on the right side of the abdomen.  Patient last was dialyzed last night.  Patient's has had peritoneal infections in the past and she said it did not feel quite like this that was more generalized.  This pain is but is isolated just to the left upper quadrant.  Patient still makes some urine.  She usually urinates once a day first thing in the morning.  Patient has been receiving dialysis for about 4 years but she is only been on peritoneal dialysis for the last year and a half.  Prior to that she was on hemodialysis but had difficulties with it so she was switched.     Past Medical History:  Diagnosis Date  . Anemia   . Chronic kidney disease    STAGE 3 CHRONIC KIDNEY DISEASE SECONDARY TO DIABETIC GLOMERULOSCLEROSIS AND UNCONTROLLED HYPERTENSION - PER OFFICE NOTES DR. Florene [facility].  Marland Kitchen ESRD on dialysis [facility])    "MWF; [address] (05/06/2016)  . Gout   . HCAP (healthcare-associated pneumonia) 05/06/2016  . History of blood transfusion    "related to surgery"  . Hyperlipidemia   . Hypertension   . Morbid obesity ([place])   . Pain    LEFT SHOULDER PAIN - WAS SEEN AT AN URGENT CARE - GIVEN SLING FOR COMFORT AND TOLD ROM AS TOLERATED.  Marland Kitchen Palpitations 09/24/2016  . Type II diabetes mellitus ([place])    "gastric sleeve OR corrected this" (05/06/2016)    Patient Active Problem List   Diagnosis Date Noted  . Elevated troponin 03/08/2018  . Folate deficiency 03/08/2018  . CAD in  native artery   . Hepatic cirrhosis ([place]) 11/24/2017  . Omental infarction ([place]) 08/25/2017  . ESRD on peritoneal dialysis ([place]) 08/25/2017  . DM (diabetes mellitus), type 2 with renal complications ([place]) 54/65/6812  . Deficiency anemia 08/24/2017  . Elevated lipase 08/24/2017  . Chronic heart failure with preserved ejection fraction ([place]) 06/22/2017  . Hypertension 09/15/2016  . Sensorineural hearing loss (SNHL), bilateral 06/29/2016  . Allergic rhinitis 06/27/2016  . Severe persistent asthma with exacerbation 05/12/2016  . Anemia of chronic disease 05/06/2016  . Routine general medical examination at a health care facility 12/05/2015  . Cervical cancer screening 12/05/2015  . Visit for screening mammogram 01/23/2015  . Sinus pause 01/04/2015  . Gout due to renal impairment 09/13/2014  . Thiamine deficiency 12/02/2012  . Meralgia paraesthetica 11/28/2012  . S/P laparoscopic sleeve gastrectomy 11/28/2012  . B12 deficiency anemia 11/28/2012  . Morbid obesity with BMI of 40.0-44.9, adult ([place]) 07/22/2012  . Dyslipidemia, goal LDL below 70 04/09/2008  . CKD (chronic kidney disease) stage V requiring chronic dialysis (MWF) 02/08/2008    Past Surgical History:  Procedure Laterality Date  . AV FISTULA PLACEMENT Left 01/03/2015   Procedure: BRACHIAL CEPHALIC ARTERIOVENOUS  FISTULA CREATION LEFT ARM;  Surgeon: Angelia Mould, MD;  Location: [facility];  Service: Vascular;  Laterality: Left;  . CARPAL TUNNEL RELEASE Right   .  [place]  . CHOLECYSTECTOMY  09/14/2017  . ESOPHAGOGASTRODUODENOSCOPY N/A 09/04/2012   Procedure: ESOPHAGOGASTRODUODENOSCOPY (EGD);  Surgeon: Shann Medal, MD;  Location: [facility];  Service: General;  Laterality: N/A;  PF  . ESOPHAGOGASTRODUODENOSCOPY (EGD) WITH ESOPHAGEAL DILATION N/A 09/29/2012   Procedure: ESOPHAGOGASTRODUODENOSCOPY (EGD) WITH ESOPHAGEAL DILATION;  Surgeon: Milus Banister, MD;  Location: [facility];  Service:  Endoscopy;  Laterality: N/A;  . INSERTION OF DIALYSIS CATHETER N/A 01/03/2015   Procedure: INSERTION OF DIALYSIS CATHETER RIGHT INTERNAL JUGULAR;  Surgeon: Angelia Mould, MD;  Location: [facility];  Service: Vascular;  Laterality: N/A;  . LAPAROSCOPIC GASTRIC SLEEVE RESECTION N/A 07/19/2012   Procedure: LAPAROSCOPIC SLEEVE GASTRECTOMY with EGD;  Surgeon: Madilyn Hook, DO;  Location: [facility];  Service: General;  Laterality: N/A;  laparoscopic sleeve gastrectomy with EGD  . LEFT HEART CATH AND CORONARY ANGIOGRAPHY N/A 02/25/2018   Procedure: LEFT HEART CATH AND CORONARY ANGIOGRAPHY;  Surgeon: Leonie Man, MD;  Location: [facility];  Service: Cardiovascular;  Laterality: N/A;  . PERIPHERAL VASCULAR CATHETERIZATION Left 05/23/2015   Procedure: Nolon Stalls;  Surgeon: Conrad Tigard, MD;  Location: [facility];  Service: Cardiovascular;  Laterality: Left;  upper aRM  . PERIPHERAL VASCULAR CATHETERIZATION Left 05/23/2015   Procedure: Peripheral Vascular Balloon Angioplasty;  Surgeon: Conrad [place], MD;  Location: [facility] CV LAB;  Service: Cardiovascular;  Laterality: Left;  av fistula  . TUBAL LIGATION  1993  . UPPER GI ENDOSCOPY N/A 07/19/2012   Procedure: UPPER GI ENDOSCOPY;  Surgeon: Madilyn Hook, DO;  [facility];  Service: General;  Laterality: N/A;     OB History   No obstetric history on file.      Home Medications    Prior to Admission medications   Medication Sig Start Date End Date Taking? Authorizing Provider  acetaminophen (TYLENOL) 325 MG tablet Take 650 mg by mouth every 6 (six) hours as needed for mild pain.   Yes [provider]  aspirin EC 81 MG EC tablet Take 1 tablet (81 mg total) by mouth daily. 02/26/18  Yes Norval Morton, MD  atorvastatin (LIPITOR) 80 MG tablet Take 1 tablet (80 mg total) by mouth daily at 6 PM. 02/26/18  Yes Smith, Rondell A, MD  AURYXIA 1 GM 210 MG(Fe) tablet Take 210 mg by mouth 3 (three) times daily with meals.  04/07/17  Yes  [provider]  calcitRIOL (ROCALTROL) 0.5 MCG capsule Take 0.5 mcg by mouth daily. 12/24/17  Yes [provider]  calcium acetate (PHOSLO) 667 MG capsule Take 2 capsules (1,334 mg total) by mouth 3 (three) times daily with meals. 01/07/15  Yes Eugenie Filler, MD  carvedilol (COREG) 3.125 MG tablet Take 1 tablet (3.125 mg total) by mouth 2 (two) times daily with a meal. 02/26/18  Yes Norval Morton, MD  cinacalcet (SENSIPAR) 30 MG tablet Take 2 tablets (60 mg total) by mouth daily with supper. 02/26/18  Yes Norval Morton, MD  folic acid (FOLVITE) 1 MG tablet Take 1 tablet (1 mg total) by mouth daily. 03/08/18  Yes [name], MD  gentamicin cream (GARAMYCIN) 0.1 % Apply 1 application topically See admin instructions. Apply to exit site daily as directed. 04/26/17  Yes [provider]  multivitamin (RENA-VIT) TABS tablet Take 1 tablet by mouth at bedtime. 01/07/15  Yes Eugenie Filler, MD  sodium chloride (OCEAN) 0.65 % SOLN nasal spray Place 1 spray into both nostrils  as needed for congestion. 03/13/18  Yes Augusto Gamble B, NP  thiamine (VITAMIN B-1) 100 MG tablet Take 1 tablet (100 mg total) by mouth daily. 06/21/17  Yes [name], MD  benzonatate (TESSALON) 100 MG capsule Take 1 capsule (100 mg total) by mouth every 8 (eight) hours. Patient not taking: Reported on 03/27/2018 03/13/18   Augusto Gamble B, NP  Cyanocobalamin (B-12 PO) Take 1 tablet by mouth daily.    [provider]  insulin aspart (NOVOLOG) 100 UNIT/ML FlexPen Inject 10 Units into the skin 3 (three) times daily with meals. Patient not taking: Reported on 03/27/2018 12/01/17   [name], MD  Insulin Glargine (BASAGLAR KWIKPEN) 100 UNIT/ML SOPN Inject 0.5 mLs (50 Units total) into the skin daily. Patient not taking: Reported on 03/27/2018 12/01/17   [name], MD  Insulin Pen Needle (NOVOFINE) 32G X 6 MM MISC 1 Act by Does not apply route 4 (four) times daily -  with meals and at  bedtime. 11/22/17   [name], MD    Family History Family History  Problem Relation Age of Onset  . Hypertension Mother   . Mitral valve prolapse Mother   . Diabetes Father   . Arthritis Other   . Hyperlipidemia Other   . Cancer Neg Hx   . Heart disease Neg Hx   . Kidney disease Neg Hx   . Stroke Neg Hx     Social History Social History   Tobacco Use  . Smoking status: Former Smoker    Packs/day: 1.00    Years: 16.00    Pack years: 16.00    Types: Cigarettes    Last attempt to quit: 2009    Years since quitting: 11.1  . Smokeless tobacco: Never Used  Substance Use Topics  . Alcohol use: Never    Frequency: Never  . Drug use: No     Allergies   Amlodipine; Clonidine derivatives; Doxycycline; and Welchol [colesevelam hcl]   Review of Systems Review of Systems  Constitutional: Negative for chills and fever.  HENT: Negative for rhinorrhea and sore throat.   Eyes: Negative for visual disturbance.  Respiratory: Negative for cough and shortness of breath.   Cardiovascular: Negative for chest pain and leg swelling.  Gastrointestinal: Positive for abdominal pain and nausea. Negative for diarrhea and vomiting.  Genitourinary: Negative for dysuria.  Musculoskeletal: Negative for back pain and neck pain.  Skin: Negative for rash.  Neurological: Negative for dizziness, light-headedness and headaches.  Hematological: Does not bruise/bleed easily.  Psychiatric/Behavioral: Negative for confusion.     Physical Exam Updated Vital Signs BP (!) 126/58 (BP Location: Right Arm)   Pulse 88   Temp 97.9 F (36.6 C) (Oral)   Resp 19   SpO2 100%   Physical Exam Vitals signs and nursing note reviewed.  Constitutional:      General: She is not in acute distress.    Appearance: She is well-developed. She is not toxic-appearing.  HENT:     Head: Normocephalic and atraumatic.     Mouth/Throat:     Mouth: Mucous membranes are moist.  Eyes:     Conjunctiva/sclera:  Conjunctivae normal.     Pupils: Pupils are equal, round, and reactive to light.  Neck:     Musculoskeletal: Normal range of motion and neck supple.  Cardiovascular:     Rate and Rhythm: Normal rate and regular rhythm.     Heart sounds: No murmur.  Pulmonary:     Effort:  Pulmonary effort is normal. No respiratory distress.     Breath sounds: Normal breath sounds.  Abdominal:     Palpations: Abdomen is soft.     Tenderness: There is abdominal tenderness.     Comments: Tenderness left upper quadrant with little bit of guarding.  No tenderness anywhere else.  Peritoneal dialysis is on the right side of the abdomen.  No tenderness around that.  Skin appears normal.  Musculoskeletal: Normal range of motion.  Skin:    General: Skin is warm and dry.     Capillary Refill: Capillary refill takes less than 2 seconds.     Findings: No rash.  Neurological:     General: No focal deficit present.     Mental Status: She is alert and oriented to person, place, and time.      ED Treatments / Results  Labs (all labs ordered are listed, but only abnormal results are displayed) Labs Reviewed  COMPREHENSIVE METABOLIC PANEL - Abnormal; Notable for the following components:      Result Value   Chloride 97 (*)    Glucose, Bld 146 (*)    BUN 42 (*)    Creatinine, Ser 13.55 (*)    Calcium 8.2 (*)    Albumin 3.1 (*)    GFR calc non Af Amer 3 (*)    GFR calc Af Amer 3 (*)    All other components within normal limits  CBC - Abnormal; Notable for the following components:   RBC 3.67 (*)    Hemoglobin 11.8 (*)    MCV 102.2 (*)    All other components within normal limits  I-STAT BETA HCG BLOOD, ED ([facility], [facility], [facility] ONLY) - Abnormal; Notable for the following components:   I-stat hCG, quantitative 8.1 (*)    All other components within normal limits  BODY FLUID CULTURE  LIPASE, BLOOD  URINALYSIS, ROUTINE W REFLEX MICROSCOPIC  BODY FLUID CELL COUNT WITH DIFFERENTIAL    EKG None  Radiology Ct  Abdomen Pelvis W Contrast  Result Date: 03/27/2018 CLINICAL DATA:  Lower abdominal pain starting Friday. Patient does dialysis every day at home. Prior gastric sleeve, peritoneal catheter placement and cholecystectomy. EXAM: CT ABDOMEN AND PELVIS WITH CONTRAST TECHNIQUE: Multidetector CT imaging of the abdomen and pelvis was performed using the standard protocol following bolus administration of intravenous contrast. CONTRAST:  136m OMNIPAQUE IOHEXOL 300 MG/ML  SOLN COMPARISON:  11/11/2017 CT FINDINGS: Lower chest: Top-normal heart size with coronary arteriosclerosis. No pericardial effusion or thickening. Clear lung bases with left basilar atelectasis. Hepatobiliary: Steatosis of the liver. More focal fatty infiltration adjacent to the falciform ligament. Surface nodularity of the liver compatible morphologic changes of cirrhosis. No space-occupying mass of the liver is seen. The patient is status post cholecystectomy. Pancreas: No pancreatic mass or ductal dilatation.  No inflammation. Spleen: Normal Adrenals/Urinary Tract: Normal bilateral adrenal glands. Mild bilateral renal atrophy and cortical thinning. No obstructive uropathy, mass or nephrolithiasis. The urinary bladder is unremarkable for the degree of distention. It is slightly thick-walled but this is likely due to underdistention. Cystitis is believed less likely. Stomach/Bowel: Gastric sleeve procedure without complicating features. Small hiatal hernia is noted. Normal small bowel rotation without obstruction or inflammation. Sigmoid diverticulosis without acute diverticulitis. The appendix is normal caliber with probable small amount of retained contrast noted. Vascular/Lymphatic: Aortoiliac atherosclerosis. No adenopathy by CT size criteria. Reproductive: Uterus and bilateral adnexa are unremarkable. Other: Dialysis catheter is seen coiled within the mid pelvis. Musculoskeletal: No acute nor suspicious osseous  abnormality. IMPRESSION: 1. Steatosis  of the liver with slight morphologic changes of cirrhosis given surface nodularity of the liver. No space-occupying mass is seen. 2. Status post cholecystectomy. 3. Gastric sleeve procedure without complicating features. 4. Sigmoid diverticulosis without acute diverticulitis. 5. Percutaneous peritoneal dialysis catheter is seen coiled pelvis without complicating features. Electronically Signed   By: Ashley Royalty M.D.   On: 03/27/2018 20:55    Procedures Procedures (including critical care time)  Medications Ordered in ED Medications  sodium chloride flush (NS) 0.9 % injection 3 mL (3 mLs Intravenous Given 03/27/18 2042)  ondansetron (ZOFRAN) injection 4 mg (4 mg Intravenous Given 03/27/18 2042)  HYDROmorphone (DILAUDID) injection 0.5 mg (0.5 mg Intravenous Given 03/27/18 2042)  iohexol (OMNIPAQUE) 300 MG/ML solution 100 mL (100 mLs Intravenous Contrast Given 03/27/18 2024)     Initial Impression / Assessment and Plan / ED Course  I have reviewed the triage vital signs and the nursing notes.  Pertinent labs & imaging results that were available during my care of the patient were reviewed by me and considered in my medical decision making (see chart for details).        Work-up for the abdominal pain included CT scan of the abdomen without any acute findings.  Clinically thought patient would maybe have diverticulitis.  Patient's white blood cell count is normal but borderline at 10,000.  Patient's labs consistent with a dialysis patient.  Due to the fact that the CT scan did not give an answer.  Discussed the case with on-call nephrology Dr. Burnett Sheng.  He decided that since we do not have a cause for the pain even though it is localized at best to get a sample of the peritoneal fluid.  He sent the peritoneal dialysis nurse down to draw the fluid for cell counts.  Based on the results he wants to be called back so we can determine proper treatment.  Options include if it white blood cell count is  extremely high admission if it is not extremely high perhaps just antibiotics with vancomycin and Tressie Ellis and then discharged home.  Or if completely normal just discharged home.  Peritoneal dialysis nurses just drawn the peritoneal fluid sample now.  Patient will be turned over to the overnight emergency physician for follow-up of the results.  And to follow-up with nephrology.  Patient nontoxic no acute distress.  However patient does seem to have some additional tenderness developing in the abdomen now some in the right lower quadrant area although not as severe as left upper quadrant.  Again CT was negative disposition will be depending on the peritoneal fluid.  Final Clinical Impressions(s) / ED Diagnoses   Final diagnoses:  Left upper quadrant pain  ESRD on peritoneal dialysis [facility])    ED Discharge Orders    None       Fredia Sorrow, MD 03/27/18 2325

## 2018-03-27 NOTE — ED Notes (Addendum)
Pt denies need to urinate at this time.  Pt reports she urinates one time a day and normally in morning.  Pt urinated this morning.  Dr. Rogene Houston made aware.

## 2018-03-27 NOTE — ED Triage Notes (Signed)
Pt c/o lower abdominal pain that started Friday. Pt assumed it was related to constipation, took lactulose that was prescribed with some relief. Pain has been continuous. Denies diarrhea/vomiting. Pt does dialysis everyday at home, does make urine, denies changes to urine.

## 2018-03-27 NOTE — ED Notes (Signed)
IV team RN unsuccessful at placing iv. Call to 2nd IV team RN placed. Pt aware of plan.

## 2018-03-28 LAB — BODY FLUID CELL COUNT WITH DIFFERENTIAL
Eos, Fluid: 1 %
Lymphs, Fluid: 6 %
Monocyte-Macrophage-Serous Fluid: 18 % — ABNORMAL LOW (ref 50–90)
Neutrophil Count, Fluid: 75 % — ABNORMAL HIGH (ref 0–25)
Total Nucleated Cell Count, Fluid: 4956 cu mm — ABNORMAL HIGH (ref 0–1000)

## 2018-03-28 LAB — GRAM STAIN

## 2018-03-28 MED ORDER — HYDROMORPHONE HCL 1 MG/ML IJ SOLN
0.5000 mg | Freq: Once | INTRAMUSCULAR | Status: AC
  Administered 2018-03-28: 0.5 mg via INTRAVENOUS
  Filled 2018-03-28: qty 1

## 2018-03-28 MED ORDER — VANCOMYCIN HCL 10 G IV SOLR
2000.0000 mg | Freq: Once | INTRAVENOUS | Status: AC
  Administered 2018-03-28: 2000 mg via INTRAVENOUS
  Filled 2018-03-28: qty 2000

## 2018-03-28 MED ORDER — HYDROCODONE-ACETAMINOPHEN 5-325 MG PO TABS: 1.0000 | ORAL_TABLET | Freq: Three times a day (TID) | ORAL | 0 refills | Status: AC | PRN

## 2018-03-28 MED ORDER — SODIUM CHLORIDE 0.9 % IV SOLN
2.0000 g | Freq: Once | INTRAVENOUS | Status: AC
  Administered 2018-03-28: 2 g via INTRAVENOUS
  Filled 2018-03-28: qty 2

## 2018-03-28 NOTE — ED Provider Notes (Signed)
I assumed care of this patient from Dr. Venita Sheffield at [facility].  Please see their note for further details of Hx, PE.  Briefly patient is a 53 y.o. female who presented with left upper quadrant abdominal pain.  Patient with a history of peritoneal dialysis.  Concern for peritonitis.  Pending peritoneal fluid analysis.   Fluid concerning for peritonitis.  Discussed this with Dr. Jonnie Finner, who recommended first doses of Vanc and Tressie Ellis here in the emergency department.  He will arrange for outpatient antibiotic management.   The patient is safe for discharge with strict return precautions.  Disposition: Discharge  Condition: Good  I have discussed the results, Dx and Tx plan with the patient who expressed understanding and agree(s) with the plan. Discharge instructions discussed at great length. The patient was given strict return precautions who verbalized understanding of the instructions. No further questions at time of discharge.    ED Discharge Orders         Ordered    HYDROcodone-acetaminophen (NORCO/VICODIN) 5-325 MG tablet  Every 8 hours PRN     03/28/18 0704           Follow Up: [name], MD [address] [place] [place] [id number] [phone]  Schedule an appointment as soon as possible for a visit  As needed       Cardama, Grayce Sessions, MD 03/28/18 [phone]

## 2018-03-28 NOTE — Discharge Instructions (Signed)
Nephrology will discuss further antibiotic treatment with your dialysis center.    Abdominal (belly) pain can be caused by many things. Your caregiver performed an examination and possibly ordered blood/urine tests and imaging (CT scan, x-rays, ultrasound). Many cases can be observed and treated at home after initial evaluation in the emergency department. Even though you are being discharged home, abdominal pain can be unpredictable. Therefore, you need a repeated exam if your pain does not resolve, returns, or worsens. Most patients with abdominal pain don't have to be admitted to the hospital or have surgery, but serious problems like appendicitis and gallbladder attacks can start out as nonspecific pain. Many abdominal conditions cannot be diagnosed in one visit, so follow-up evaluations are very important. SEEK IMMEDIATE MEDICAL ATTENTION IF: The pain does not go away or becomes severe.  A temperature above 101 develops.  Repeated vomiting occurs (multiple episodes).  The pain becomes localized to portions of the abdomen. The right side could possibly be appendicitis. In an adult, the left lower portion of the abdomen could be colitis or diverticulitis.  Blood is being passed in stools or vomit (bright red or black tarry stools).  Return also if you develop chest pain, difficulty breathing, dizziness or fainting, or become confused, poorly responsive, or inconsolable (young children).

## 2018-03-28 NOTE — ED Notes (Signed)
Urine unable to be sent due to patient only voids once a day in the morning.  PD fluid collected and sent by dialysis nurse.

## 2018-03-29 LAB — PATHOLOGIST SMEAR REVIEW

## 2018-03-30 ENCOUNTER — Telehealth (HOSPITAL_BASED_OUTPATIENT_CLINIC_OR_DEPARTMENT_OTHER): Payer: Self-pay | Admitting: Emergency Medicine

## 2018-03-30 LAB — CULTURE, BODY FLUID W GRAM STAIN -BOTTLE: Special Requests: ADEQUATE

## 2018-03-31 ENCOUNTER — Telehealth: Payer: Self-pay

## 2018-03-31 NOTE — Telephone Encounter (Signed)
Post ED Visit - Positive Culture Follow-up  Culture report reviewed by antimicrobial stewardship pharmacist: [place] Team []  Elenor Quinones, Pharm.D. []  Heide Guile, Pharm.D., BCPS AQ-ID []  Parks Neptune, Pharm.D., BCPS []  Alycia Rossetti, Pharm.D., BCPS []  [place], Pharm.D., BCPS, AAHIVP []  Legrand Como, Pharm.D., BCPS, AAHIVP []  Salome Arnt, PharmD, BCPS []  Johnnette Gourd, PharmD, BCPS [x]  Hughes Better, PharmD, BCPS []  Leeroy Cha, PharmD []  Laqueta Linden, PharmD, BCPS []  Albertina Parr, PharmD  [facility] Team []  Leodis Sias, PharmD []  Lindell Spar, PharmD []  Royetta Asal, PharmD []  Graylin Shiver, Rph []  Rema Fendt) Glennon Mac, PharmD []  Arlyn Dunning, PharmD []  Netta Cedars, PharmD []  Dia Sitter, PharmD []  Leone Haven, PharmD []  Gretta Arab, PharmD []  Theodis Shove, PharmD []  Peggyann Juba, PharmD []  Reuel Boom, PharmD   Positive body fluid culture and no further patient follow-up is required at this time. Already addressed  Genia Del 03/31/2018, 4:23 PM

## 2018-04-04 ENCOUNTER — Telehealth: Payer: 59 | Admitting: Family

## 2018-04-04 DIAGNOSIS — B373 Candidiasis of vulva and vagina: Secondary | ICD-10-CM

## 2018-04-04 DIAGNOSIS — B3731 Acute candidiasis of vulva and vagina: Secondary | ICD-10-CM

## 2018-04-04 MED ORDER — FLUCONAZOLE 150 MG PO TABS: 150.0000 mg | ORAL_TABLET | ORAL | 0 refills | Status: DC | PRN

## 2018-04-04 NOTE — Progress Notes (Signed)
We are sorry that you are not feeling well. Here is how we plan to help! Based on what you shared with me it looks like you: May have a yeast vaginosis  Vaginosis is an inflammation of the vagina that can result in discharge, itching and pain. The cause is usually a change in the normal balance of vaginal bacteria or an infection. Vaginosis can also result from reduced estrogen levels after menopause.  The most common causes of vaginosis are:   Bacterial vaginosis which results from an overgrowth of one on several organisms that are normally present in your vagina.   Yeast infections which are caused by a naturally occurring fungus called candida.   Vaginal atrophy (atrophic vaginosis) which results from the thinning of the vagina from reduced estrogen levels after menopause.   Trichomoniasis which is caused by a parasite and is commonly transmitted by sexual intercourse.  Factors that increase your risk of developing vaginosis include: Marland Kitchen Medications, such as antibiotics and steroids . Uncontrolled diabetes . Use of hygiene products such as bubble bath, vaginal spray or vaginal deodorant . Douching . Wearing damp or tight-fitting clothing . Using an intrauterine device (IUD) for birth control . Hormonal changes, such as those associated with pregnancy, birth control pills or menopause . Sexual activity . Having a sexually transmitted infection  Your treatment plan is A single Diflucan (fluconazole) 174m tablet once.  I have electronically sent this prescription into the pharmacy that you have chosen.  Be sure to take all of the medication as directed. Stop taking any medication if you develop a rash, tongue swelling or shortness of breath. Mothers who are breast feeding should consider pumping and discarding their breast milk while on these antibiotics. However, there is no consensus that infant exposure at these doses would be harmful.  Remember that medication creams can weaken latex  condoms.  Approximately 5 minutes was spent documenting and reviewing patient's chart.    HOME CARE:  Good hygiene may prevent some types of vaginosis from recurring and may relieve some symptoms:  . Avoid baths, hot tubs and whirlpool spas. Rinse soap from your outer genital area after a shower, and dry the area well to prevent irritation. Don't use scented or harsh soaps, such as those with deodorant or antibacterial action. .Marland KitchenAvoid irritants. These include scented tampons and pads. . Wipe from front to back after using the toilet. Doing so avoids spreading fecal bacteria to your vagina.  Other things that may help prevent vaginosis include:  .Marland KitchenDon't douche. Your vagina doesn't require cleansing other than normal bathing. Repetitive douching disrupts the normal organisms that reside in the vagina and can actually increase your risk of vaginal infection. Douching won't clear up a vaginal infection. . Use a latex condom. Both female and female latex condoms may help you avoid infections spread by sexual contact. . Wear cotton underwear. Also wear pantyhose with a cotton crotch. If you feel comfortable without it, skip wearing underwear to bed. Yeast thrives in mCampbell SoupYour symptoms should improve in the next day or two.  GET HELP RIGHT AWAY IF:  . You have pain in your lower abdomen ( pelvic area or over your ovaries) . You develop nausea or vomiting . You develop a fever . Your discharge changes or worsens . You have persistent pain with intercourse . You develop shortness of breath, a rapid pulse, or you faint.  These symptoms could be signs of problems or infections that need to be evaluated  by a medical provider now.  MAKE SURE YOU    Understand these instructions.  Will watch your condition.  Will get help right away if you are not doing well or get worse.  Your e-visit answers were reviewed by a board certified advanced clinical practitioner to complete your  personal care plan. Depending upon the condition, your plan could have included both over the counter or prescription medications. Please review your pharmacy choice to make sure that you have choses a pharmacy that is open for you to pick up any needed prescription, Your safety is important to [place]. If you have drug allergies check your prescription carefully.   You can use MyChart to ask questions about today's visit, request a non-urgent call back, or ask for a work or school excuse for 24 hours related to this e-Visit. If it has been greater than 24 hours you will need to follow up with your provider, or enter a new e-Visit to address those concerns. You will get a MyChart message within the next two days asking about your experience. I hope that your e-visit has been valuable and will speed your recovery.

## 2018-04-09 ENCOUNTER — Other Ambulatory Visit: Payer: Self-pay

## 2018-04-09 ENCOUNTER — Encounter (HOSPITAL_COMMUNITY): Payer: Self-pay | Admitting: Radiology

## 2018-04-09 ENCOUNTER — Emergency Department (HOSPITAL_COMMUNITY): Payer: 59

## 2018-04-09 ENCOUNTER — Emergency Department (HOSPITAL_COMMUNITY)
Admission: EM | Admit: 2018-04-09 | Discharge: 2018-04-09 | Disposition: A | Discharge status: Home / Self Care | Payer: 59 | Attending: Emergency Medicine | Admitting: Emergency Medicine

## 2018-04-09 DIAGNOSIS — Z794 Long term (current) use of insulin: Secondary | ICD-10-CM | POA: Diagnosis not present

## 2018-04-09 DIAGNOSIS — E1122 Type 2 diabetes mellitus with diabetic chronic kidney disease: Secondary | ICD-10-CM | POA: Diagnosis not present

## 2018-04-09 DIAGNOSIS — I509 Heart failure, unspecified: Secondary | ICD-10-CM | POA: Diagnosis not present

## 2018-04-09 DIAGNOSIS — R109 Unspecified abdominal pain: Secondary | ICD-10-CM | POA: Diagnosis not present

## 2018-04-09 DIAGNOSIS — Z7982 Long term (current) use of aspirin: Secondary | ICD-10-CM | POA: Diagnosis not present

## 2018-04-09 DIAGNOSIS — Z992 Dependence on renal dialysis: Secondary | ICD-10-CM | POA: Diagnosis not present

## 2018-04-09 DIAGNOSIS — I132 Hypertensive heart and chronic kidney disease with heart failure and with stage 5 chronic kidney disease, or end stage renal disease: Secondary | ICD-10-CM | POA: Insufficient documentation

## 2018-04-09 DIAGNOSIS — Z79899 Other long term (current) drug therapy: Secondary | ICD-10-CM | POA: Insufficient documentation

## 2018-04-09 DIAGNOSIS — Z87891 Personal history of nicotine dependence: Secondary | ICD-10-CM | POA: Diagnosis not present

## 2018-04-09 DIAGNOSIS — N186 End stage renal disease: Secondary | ICD-10-CM | POA: Diagnosis not present

## 2018-04-09 LAB — COMPREHENSIVE METABOLIC PANEL
ALT: 6 U/L (ref 0–44)
AST: 34 U/L (ref 15–41)
Albumin: 2.9 g/dL — ABNORMAL LOW (ref 3.5–5.0)
Alkaline Phosphatase: 123 U/L (ref 38–126)
Anion gap: 21 — ABNORMAL HIGH (ref 5–15)
BUN: 46 mg/dL — ABNORMAL HIGH (ref 6–20)
CO2: 19 mmol/L — ABNORMAL LOW (ref 22–32)
Calcium: 7.8 mg/dL — ABNORMAL LOW (ref 8.9–10.3)
Chloride: 95 mmol/L — ABNORMAL LOW (ref 98–111)
Creatinine, Ser: 15.69 mg/dL — ABNORMAL HIGH (ref 0.44–1.00)
GFR calc Af Amer: 3 mL/min — ABNORMAL LOW (ref >60)
GFR calc non Af Amer: 2 mL/min — ABNORMAL LOW (ref >60)
Glucose, Bld: 84 mg/dL (ref 70–99)
Potassium: 4.3 mmol/L (ref 3.5–5.1)
Sodium: 135 mmol/L (ref 135–145)
Total Bilirubin: 0.8 mg/dL (ref 0.3–1.2)
Total Protein: 6.8 g/dL (ref 6.5–8.1)

## 2018-04-09 LAB — CBC
HCT: 37.4 % (ref 36.0–46.0)
Hemoglobin: 12.1 g/dL (ref 12.0–15.0)
MCH: 32.4 pg (ref 26.0–34.0)
MCHC: 32.4 g/dL (ref 30.0–36.0)
MCV: 100 fL (ref 80.0–100.0)
Platelets: 351 10*3/uL (ref 150–400)
RBC: 3.74 MIL/uL — ABNORMAL LOW (ref 3.87–5.11)
RDW: 14.3 % (ref 11.5–15.5)
WBC: 6.4 10*3/uL (ref 4.0–10.5)
nRBC: 0.3 % — ABNORMAL HIGH (ref 0.0–0.2)

## 2018-04-09 LAB — LACTIC ACID, PLASMA: Lactic Acid, Venous: 1.6 mmol/L (ref 0.5–1.9)

## 2018-04-09 MED ORDER — SODIUM CHLORIDE 0.9 % IV BOLUS
500.0000 mL | Freq: Once | INTRAVENOUS | Status: AC
  Administered 2018-04-09: 500 mL via INTRAVENOUS

## 2018-04-09 MED ORDER — ONDANSETRON 8 MG PO TBDP: 8.0000 mg | ORAL_TABLET | Freq: Three times a day (TID) | ORAL | 0 refills | Status: DC | PRN

## 2018-04-09 MED ORDER — MORPHINE SULFATE (PF) 4 MG/ML IV SOLN
4.0000 mg | Freq: Once | INTRAVENOUS | Status: AC
  Administered 2018-04-09: 4 mg via INTRAVENOUS
  Filled 2018-04-09: qty 1

## 2018-04-09 MED ORDER — IOHEXOL 300 MG/ML  SOLN
100.0000 mL | Freq: Once | INTRAMUSCULAR | Status: AC | PRN
  Administered 2018-04-09: 100 mL via INTRAVENOUS

## 2018-04-09 MED ORDER — ONDANSETRON HCL 4 MG/2ML IJ SOLN
4.0000 mg | Freq: Once | INTRAMUSCULAR | Status: AC
  Administered 2018-04-09: 4 mg via INTRAVENOUS
  Filled 2018-04-09: qty 2

## 2018-04-09 NOTE — ED Notes (Signed)
Pt st's she feels much better after pain med.  Pt talking with her husband

## 2018-04-09 NOTE — ED Provider Notes (Signed)
[facility] EMERGENCY DEPARTMENT Provider Note   CSN: [id number] Arrival date & time: 04/09/18  1514    History   Chief Complaint Chief Complaint  Patient presents with  . Abdominal Pain    HPI Brenda Davis is a 53 y.o. female.     HPI Patient is a 53 year old female with a history of peritoneal antibiotics for peritonitis.  She is approximately 12 days into her 14-day course.  She presents to the emergency department with generalized abdominal pain.  No fevers or chills.  No nausea or vomiting.  She continues to dialyze at home without difficulty.  She reports that she did not dialyze yesterday because of generally not feeling well.  Her blood pressure was also in the low 90s to high 80s and she was concerned that if she had added dialysis light and dialyze that she would be more hypotensive.  She presents the ER for evaluation this time.  She had been seen in the emergency department on 03/28/2018 when her diagnosis was made.   Past Medical History:  Diagnosis Date  . Anemia   . Chronic kidney disease    STAGE 3 CHRONIC KIDNEY DISEASE SECONDARY TO DIABETIC GLOMERULOSCLEROSIS AND UNCONTROLLED HYPERTENSION - PER OFFICE NOTES DR. Florene Glen [facility].  Marland Kitchen ESRD on dialysis [facility]    "MWF; [address] (05/06/2016)  . Gout   . HCAP (healthcare-associated pneumonia) 05/06/2016  . History of blood transfusion    "related to surgery"  . Hyperlipidemia   . Hypertension   . Morbid obesity ([place])   . Pain    LEFT SHOULDER PAIN - WAS SEEN AT AN URGENT CARE - GIVEN SLING FOR COMFORT AND TOLD ROM AS TOLERATED.  Marland Kitchen Palpitations 09/24/2016  . Type II diabetes mellitus ([place])    "gastric sleeve OR corrected this" (05/06/2016)    Patient Active Problem List   Diagnosis Date Noted  . Elevated troponin 03/08/2018  . Folate deficiency 03/08/2018  . CAD in native artery   . Hepatic cirrhosis ([place]) 11/24/2017  . Omental infarction ([place]) 08/25/2017  .  ESRD on peritoneal dialysis ([place]) 08/25/2017  . DM (diabetes mellitus), type 2 with renal complications ([place]) 62/86/3817  . Deficiency anemia 08/24/2017  . Elevated lipase 08/24/2017  . Chronic heart failure with preserved ejection fraction ([place]) 06/22/2017  . Hypertension 09/15/2016  . Sensorineural hearing loss (SNHL), bilateral 06/29/2016  . Allergic rhinitis 06/27/2016  . Severe persistent asthma with exacerbation 05/12/2016  . Anemia of chronic disease 05/06/2016  . Routine general medical examination at a health care facility 12/05/2015  . Cervical cancer screening 12/05/2015  . Visit for screening mammogram 01/23/2015  . Sinus pause 01/04/2015  . Gout due to renal impairment 09/13/2014  . Thiamine deficiency 12/02/2012  . Meralgia paraesthetica 11/28/2012  . S/P laparoscopic sleeve gastrectomy 11/28/2012  . B12 deficiency anemia 11/28/2012  . Morbid obesity with BMI of 40.0-44.9, adult ([place]) 07/22/2012  . Dyslipidemia, goal LDL below 70 04/09/2008  . CKD (chronic kidney disease) stage V requiring chronic dialysis (MWF) 02/08/2008    Past Surgical History:  Procedure Laterality Date  . AV FISTULA PLACEMENT Left 01/03/2015   Procedure: BRACHIAL CEPHALIC ARTERIOVENOUS  FISTULA CREATION LEFT ARM;  Surgeon: Angelia Mould, MD;  Location: [facility];  Service: Vascular;  Laterality: Left;  . CARPAL TUNNEL RELEASE Right   . [place]  . CHOLECYSTECTOMY  09/14/2017  . ESOPHAGOGASTRODUODENOSCOPY N/A 09/04/2012   Procedure: ESOPHAGOGASTRODUODENOSCOPY (EGD);  Surgeon: Fenton Malling  Lucia Gaskins, MD;  Location: [facility];  Service: General;  Laterality: N/A;  PF  . ESOPHAGOGASTRODUODENOSCOPY (EGD) WITH ESOPHAGEAL DILATION N/A 09/29/2012   Procedure: ESOPHAGOGASTRODUODENOSCOPY (EGD) WITH ESOPHAGEAL DILATION;  Surgeon: Milus Banister, MD;  Location: [facility];  Service: Endoscopy;  Laterality: N/A;  . INSERTION OF DIALYSIS CATHETER N/A 01/03/2015   Procedure: INSERTION OF  DIALYSIS CATHETER RIGHT INTERNAL JUGULAR;  Surgeon: Angelia Mould, MD;  Location: [facility];  Service: Vascular;  Laterality: N/A;  . LAPAROSCOPIC GASTRIC SLEEVE RESECTION N/A 07/19/2012   Procedure: LAPAROSCOPIC SLEEVE GASTRECTOMY with EGD;  Surgeon: Madilyn Hook, DO;  Location: [facility];  Service: General;  Laterality: N/A;  laparoscopic sleeve gastrectomy with EGD  . LEFT HEART CATH AND CORONARY ANGIOGRAPHY N/A 02/25/2018   Procedure: LEFT HEART CATH AND CORONARY ANGIOGRAPHY;  Surgeon: Leonie Man, MD;  Location: [facility];  Service: Cardiovascular;  Laterality: N/A;  . PERIPHERAL VASCULAR CATHETERIZATION Left 05/23/2015   Procedure: Nolon Stalls;  Surgeon: Conrad Reserve, MD;  Location: [facility];  Service: Cardiovascular;  Laterality: Left;  upper aRM  . PERIPHERAL VASCULAR CATHETERIZATION Left 05/23/2015   Procedure: Peripheral Vascular Balloon Angioplasty;  Surgeon: Conrad [place], MD;  Location: [facility];  Service: Cardiovascular;  Laterality: Left;  av fistula  . TUBAL LIGATION  1993  . UPPER GI ENDOSCOPY N/A 07/19/2012   Procedure: UPPER GI ENDOSCOPY;  Surgeon: Madilyn Hook, DO;  Location: [facility];  Service: General;  Laterality: N/A;     OB History   No obstetric history on file.      Home Medications    Prior to Admission medications   Medication Sig Start Date End Date Taking? Authorizing Provider  acetaminophen (TYLENOL) 325 MG tablet Take 650 mg by mouth every 6 (six) hours as needed for mild pain.   Yes [provider]  aspirin EC 81 MG EC tablet Take 1 tablet (81 mg total) by mouth daily. 02/26/18  Yes Norval Morton, MD  atorvastatin (LIPITOR) 80 MG tablet Take 1 tablet (80 mg total) by mouth daily at 6 PM. 02/26/18  Yes Smith, Rondell A, MD  AURYXIA 1 GM 210 MG(Fe) tablet Take 210 mg by mouth 3 (three) times daily with meals.  04/07/17  Yes [provider]  calcium acetate (PHOSLO) 667 MG capsule Take 2 capsules (1,334 mg total) by  mouth 3 (three) times daily with meals. 01/07/15  Yes Eugenie Filler, MD  carvedilol (COREG) 3.125 MG tablet Take 1 tablet (3.125 mg total) by mouth 2 (two) times daily with a meal. 02/26/18  Yes Norval Morton, MD  cinacalcet (SENSIPAR) 30 MG tablet Take 2 tablets (60 mg total) by mouth daily with supper. 02/26/18  Yes Smith, Rondell A, MD  Cyanocobalamin (B-12 PO) Take 1 tablet by mouth daily.   Yes [provider]  fluconazole (DIFLUCAN) 150 MG tablet Take 1 tablet (150 mg total) by mouth every three (3) days as needed. 04/04/18  Yes Hawks, Christy A, FNP  folic acid (FOLVITE) 1 MG tablet Take 1 tablet (1 mg total) by mouth daily. 03/08/18  Yes [name], MD  gentamicin cream (GARAMYCIN) 0.1 % Apply 1 application topically See admin instructions. Apply to exit site daily as directed. 04/26/17  Yes [provider]  ondansetron (ZOFRAN) 4 MG tablet Take 4 mg by mouth every 8 (eight) hours as needed for nausea/vomiting. 04/04/18  Yes [provider]  benzonatate (TESSALON) 100 MG capsule Take 1  capsule (100 mg total) by mouth every 8 (eight) hours. Patient not taking: Reported on 03/27/2018 03/13/18   Augusto Gamble B, NP  calcitRIOL (ROCALTROL) 0.5 MCG capsule Take 0.5 mcg by mouth daily. 12/24/17   [provider]  insulin aspart (NOVOLOG) 100 UNIT/ML FlexPen Inject 10 Units into the skin 3 (three) times daily with meals. Patient not taking: Reported on 03/27/2018 12/01/17   [name], MD  Insulin Glargine (BASAGLAR KWIKPEN) 100 UNIT/ML SOPN Inject 0.5 mLs (50 Units total) into the skin daily. Patient not taking: Reported on 03/27/2018 12/01/17   [name], MD  multivitamin (RENA-VIT) TABS tablet Take 1 tablet by mouth at bedtime. Patient not taking: Reported on 04/09/2018 01/07/15   Eugenie Filler, MD  sodium chloride (OCEAN) 0.65 % SOLN nasal spray Place 1 spray into both nostrils as needed for congestion. Patient not taking: Reported on 04/09/2018  03/13/18   Augusto Gamble B, NP  thiamine (VITAMIN B-1) 100 MG tablet Take 1 tablet (100 mg total) by mouth daily. Patient not taking: Reported on 04/09/2018 06/21/17   [name], MD    Family History Family History  Problem Relation Age of Onset  . Hypertension Mother   . Mitral valve prolapse Mother   . Diabetes Father   . Arthritis Other   . Hyperlipidemia Other   . Cancer Neg Hx   . Heart disease Neg Hx   . Kidney disease Neg Hx   . Stroke Neg Hx     Social History Social History   Tobacco Use  . Smoking status: Former Smoker    Packs/day: 1.00    Years: 16.00    Pack years: 16.00    Types: Cigarettes    Last attempt to quit: 2009    Years since quitting: 11.2  . Smokeless tobacco: Never Used  Substance Use Topics  . Alcohol use: Never    Frequency: Never  . Drug use: No     Allergies   Amlodipine; Clonidine derivatives; Doxycycline; and Welchol [colesevelam hcl]   Review of Systems Review of Systems  All other systems reviewed and are negative.    Physical Exam Updated Vital Signs BP 114/71 (BP Location: Right Arm)   Pulse 83   Temp 98.5 F (36.9 C) (Oral)   Resp (!) 32   Ht 5' 5"  (1.651 m)   Wt 112.5 kg   SpO2 100%   BMI 41.27 kg/m   Physical Exam Vitals signs and nursing note reviewed.  Constitutional:      General: She is not in acute distress.    Appearance: She is well-developed.  HENT:     Head: Normocephalic and atraumatic.  Neck:     Musculoskeletal: Normal range of motion.  Cardiovascular:     Rate and Rhythm: Normal rate and regular rhythm.     Heart sounds: Normal heart sounds.  Pulmonary:     Effort: Pulmonary effort is normal.     Breath sounds: Normal breath sounds.  Abdominal:     General: There is no distension.     Palpations: Abdomen is soft.     Tenderness: There is generalized abdominal tenderness. There is no guarding or rebound.  Musculoskeletal: Normal range of motion.  Skin:    General: Skin is warm and  dry.  Neurological:     Mental Status: She is alert and oriented to person, place, and time.  Psychiatric:        Judgment: Judgment normal.  ED Treatments / Results  Labs (all labs ordered are listed, but only abnormal results are displayed) Labs Reviewed  CBC - Abnormal; Notable for the following components:      Result Value   RBC 3.74 (*)    nRBC 0.3 (*)    All other components within normal limits  COMPREHENSIVE METABOLIC PANEL - Abnormal; Notable for the following components:   Chloride 95 (*)    CO2 19 (*)    BUN 46 (*)    Creatinine, Ser 15.69 (*)    Calcium 7.8 (*)    Albumin 2.9 (*)    GFR calc non Af Amer 2 (*)    GFR calc Af Amer 3 (*)    Anion gap 21 (*)    All other components within normal limits  LACTIC ACID, PLASMA    EKG None  Radiology Ct Abdomen Pelvis W Contrast  Result Date: 04/09/2018 CLINICAL DATA:  Nausea and vomiting and abdominal pain EXAM: CT ABDOMEN AND PELVIS WITH CONTRAST TECHNIQUE: Multidetector CT imaging of the abdomen and pelvis was performed using the standard protocol following bolus administration of intravenous contrast. CONTRAST:  177m OMNIPAQUE IOHEXOL 300 MG/ML  SOLN COMPARISON:  03/27/2018 FINDINGS: Lower chest: No acute abnormality. Hepatobiliary: Diffuse fatty infiltration of the liver is noted. No focal mass is seen. The gallbladder has been surgically removed. Pancreas: Unremarkable. No pancreatic ductal dilatation or surrounding inflammatory changes. Spleen: Normal in size without focal abnormality. Adrenals/Urinary Tract: Adrenal glands are within normal limits. Renal vascular calcifications are noted. No obstructive changes are seen. The ureters are within normal limits bilaterally. The bladder is decompressed. Stomach/Bowel: Scattered diverticular changes noted within the colon. No evidence of diverticulitis is seen. The appendix is within normal limits. No obstructive or inflammatory changes of the small bowel are noted.  Changes of prior sleeve gastrectomy are seen. Small sliding-type hiatal hernia is noted. Vascular/Lymphatic: Aortic atherosclerosis. No enlarged abdominal or pelvic lymph nodes. Reproductive: Uterus and bilateral adnexa are unremarkable. Other: Peritoneal dialysis catheter is noted deep within the pelvis. Small amount of free fluid is noted within the pelvis associated with the catheter. Additionally there are a few foci of air identified in the upper abdomen adjacent to the spleen and stomach. These are likely related to the peritoneal dialysis catheter. By history the patient is receiving treatment for peritonitis. Musculoskeletal: No acute bony abnormality is seen. IMPRESSION: Minimal free air and free fluid within the abdomen likely related to the patient's known peritoneal dialysis catheter. No definitive evidence to suggest perforation are seen. Diverticulosis without diverticulitis. Fatty liver. Electronically Signed   By: MInez CatalinaM.D.   On: 04/09/2018 16:50    Procedures Procedures (including critical care time)  Medications Ordered in ED Medications  sodium chloride 0.9 % bolus 500 mL (0 mLs Intravenous Stopped 04/09/18 1718)  morphine 4 MG/ML injection 4 mg (4 mg Intravenous Given 04/09/18 1553)  ondansetron (ZOFRAN) injection 4 mg (4 mg Intravenous Given 04/09/18 1552)  iohexol (OMNIPAQUE) 300 MG/ML solution 100 mL (100 mLs Intravenous Contrast Given 04/09/18 1603)     Initial Impression / Assessment and Plan / ED Course  I have reviewed the triage vital signs and the nursing notes.  Pertinent labs & imaging results that were available during my care of the patient were reviewed by me and considered in my medical decision making (see chart for details).        Patient feels much better after treatment here in the emergency department.  Labs are reassuring.  CT scan is reassuring.  Patient will discuss ongoing management with her nephrology team.  No indication for admission the  hospital.  500 cc fluid given.  Patient understands return the emergency department for new or worsening symptoms    Final Clinical Impressions(s) / ED Diagnoses   Final diagnoses:  Abdominal pain, unspecified abdominal location    ED Discharge Orders    None       Jola Schmidt, MD 04/09/18 [phone]

## 2018-04-09 NOTE — ED Triage Notes (Signed)
Pt to ED via [facility] with c/o generalized abd pain.  Pt was seen here on 3/9 and dx with peritonitis and started her on antibiotics.  Pt st's she was feeling better until yesterday.  Pt does home dialysis at home everyday

## 2018-04-09 NOTE — ED Notes (Signed)
Pt to CT at this time.

## 2018-04-12 ENCOUNTER — Encounter: Payer: Self-pay | Admitting: Internal Medicine

## 2018-04-12 ENCOUNTER — Ambulatory Visit (INDEPENDENT_AMBULATORY_CARE_PROVIDER_SITE_OTHER): Payer: 59 | Admitting: Internal Medicine

## 2018-04-12 ENCOUNTER — Telehealth: Payer: Self-pay | Admitting: Internal Medicine

## 2018-04-12 ENCOUNTER — Other Ambulatory Visit: Payer: Self-pay

## 2018-04-12 VITALS — BP 130/80 | HR 98 | Temp 98.1°F | Resp 16 | Ht 65.0 in | Wt 253.0 lb

## 2018-04-12 DIAGNOSIS — R6881 Early satiety: Secondary | ICD-10-CM | POA: Diagnosis not present

## 2018-04-12 DIAGNOSIS — T8571XD Infection and inflammatory reaction due to peritoneal dialysis catheter, subsequent encounter: Secondary | ICD-10-CM | POA: Diagnosis not present

## 2018-04-12 DIAGNOSIS — I2583 Coronary atherosclerosis due to lipid rich plaque: Secondary | ICD-10-CM

## 2018-04-12 DIAGNOSIS — I2510 Atherosclerotic heart disease of native coronary artery without angina pectoris: Secondary | ICD-10-CM | POA: Diagnosis not present

## 2018-04-12 DIAGNOSIS — T8571XA Infection and inflammatory reaction due to peritoneal dialysis catheter, initial encounter: Secondary | ICD-10-CM | POA: Insufficient documentation

## 2018-04-12 LAB — MICROALBUMIN, URINE

## 2018-04-12 MED ORDER — ATORVASTATIN CALCIUM 80 MG PO TABS: 80.0000 mg | ORAL_TABLET | Freq: Every day | ORAL | 1 refills | Status: DC

## 2018-04-12 MED ORDER — OXYCODONE HCL 5 MG PO TABS: 5.0000 mg | ORAL_TABLET | Freq: Four times a day (QID) | ORAL | 0 refills | Status: DC | PRN

## 2018-04-12 NOTE — Progress Notes (Signed)
Subjective:  Patient ID: Brenda Davis, female    DOB: 1965/07/27  Age: 53 y.o. MRN: [id number]  CC: Abdominal Pain   HPI BRICEIDA RASBERRY presents for f/up - She was seen in the ED 3 days ago for recurrent episodes of abdominal pain.  Her CT scan of the abdomen and pelvis was unremarkable. She tells me that her nephrologist has been treating her for bacterial peritonitis with antibiotics infused through the peritoneal catheter.  She however continues to complain of an intermittent stabbing pain in the epigastric region and in bilateral lower quadrants.  She has been trying to control the pain with hydrocodone but has not gotten much relief.  The pain interferes with her sleep and her ability to eat.  She complains of intermittent nausea, vomiting, loss of appetite, early satiety, and weight loss.  She wants to be tested for Helicobacter pylori infection.  Outpatient Medications Prior to Visit  Medication Sig Dispense Refill   acetaminophen (TYLENOL) 325 MG tablet Take 650 mg by mouth every 6 (six) hours as needed for mild pain.     aspirin EC 81 MG EC tablet Take 1 tablet (81 mg total) by mouth daily. 30 tablet 0   AURYXIA 1 GM 210 MG(Fe) tablet Take 210 mg by mouth 3 (three) times daily with meals.   3   calcium acetate (PHOSLO) 667 MG capsule Take 2 capsules (1,334 mg total) by mouth 3 (three) times daily with meals. 180 capsule 0   carvedilol (COREG) 3.125 MG tablet Take 1 tablet (3.125 mg total) by mouth 2 (two) times daily with a meal. 60 tablet 0   cinacalcet (SENSIPAR) 30 MG tablet Take 2 tablets (60 mg total) by mouth daily with supper. 30 tablet 0   Cyanocobalamin (B-12 PO) Take 1 tablet by mouth daily.     folic acid (FOLVITE) 1 MG tablet Take 1 tablet (1 mg total) by mouth daily. 90 tablet 1   gentamicin cream (GARAMYCIN) 0.1 % Apply 1 application topically See admin instructions. Apply to exit site daily as directed.  4   ondansetron (ZOFRAN ODT) 8  MG disintegrating tablet Take 1 tablet (8 mg total) by mouth every 8 (eight) hours as needed for nausea or vomiting. 10 tablet 0   atorvastatin (LIPITOR) 80 MG tablet Take 1 tablet (80 mg total) by mouth daily at 6 PM. 30 tablet 0   fluconazole (DIFLUCAN) 150 MG tablet Take 1 tablet (150 mg total) by mouth every three (3) days as needed. 3 tablet 0   ondansetron (ZOFRAN) 4 MG tablet Take 4 mg by mouth every 8 (eight) hours as needed for nausea/vomiting.     No facility-administered medications prior to visit.     ROS Review of Systems  Constitutional: Positive for activity change, appetite change, fatigue and unexpected weight change (wt loss). Negative for chills, diaphoresis and fever.  HENT: Negative.  Negative for trouble swallowing.   Eyes: Negative for visual disturbance.  Respiratory: Negative for cough, chest tightness, shortness of breath and wheezing.   Gastrointestinal: Positive for abdominal pain, nausea and vomiting. Negative for abdominal distention, blood in stool, constipation, diarrhea and rectal pain.  Genitourinary: Negative.  Negative for decreased urine volume, difficulty urinating, dysuria, urgency, vaginal bleeding and vaginal discharge.  Musculoskeletal: Negative.  Negative for arthralgias and myalgias.  Skin: Negative.  Negative for color change and pallor.  Neurological: Negative.  Negative for dizziness, weakness, light-headedness and numbness.  Hematological: Negative for adenopathy. Does not bruise/bleed easily.  Objective:  BP 130/80 (BP Location: Left Arm, Patient Position: Sitting, Cuff Size: Normal)    Pulse 98    Temp 98.1 F (36.7 C) (Oral)    Resp 16    Ht 5' 5"  (1.651 m)    Wt 253 lb (114.8 kg)    SpO2 97%    BMI 42.10 kg/m   BP Readings from Last 3 Encounters:  04/12/18 130/80  04/09/18 (!) 95/52  03/28/18 106/61    Wt Readings from Last 3 Encounters:  04/12/18 253 lb (114.8 kg)  04/09/18 248 lb (112.5 kg)  03/16/18 254 lb (115.2 kg)     Physical Exam Vitals signs reviewed.  Constitutional:      General: She is not in acute distress.    Appearance: She is obese. She is ill-appearing (in pain). She is not toxic-appearing or diaphoretic.  HENT:     Mouth/Throat:     Mouth: Mucous membranes are moist.     Pharynx: No pharyngeal swelling or oropharyngeal exudate.  Eyes:     General: No scleral icterus. Cardiovascular:     Rate and Rhythm: Normal rate and regular rhythm.     Heart sounds: No murmur. No gallop.   Pulmonary:     Effort: Pulmonary effort is normal. No respiratory distress.     Breath sounds: No stridor. No wheezing or rhonchi.  Abdominal:     General: Abdomen is protuberant. Bowel sounds are normal.     Palpations: Abdomen is soft. There is no fluid wave.     Tenderness: There is abdominal tenderness in the right lower quadrant, epigastric area and left lower quadrant. There is no guarding or rebound.     Hernia: No hernia is present.  Musculoskeletal:     Right lower leg: No edema.     Left lower leg: No edema.  Skin:    General: Skin is warm and dry.  Neurological:     General: No focal deficit present.  Psychiatric:        Attention and Perception: Attention normal.        Mood and Affect: Mood is anxious. Affect is tearful.        Speech: Speech normal.        Behavior: Behavior normal. Behavior is cooperative.        Thought Content: Thought content normal.     Lab Results  Component Value Date   WBC 6.4 04/09/2018   HGB 12.1 04/09/2018   HCT 37.4 04/09/2018   PLT 351 04/09/2018   GLUCOSE 84 04/09/2018   CHOL 225 (H) 02/26/2018   TRIG 186 (H) 02/26/2018   HDL 38 (L) 02/26/2018   LDLDIRECT 161.6 11/28/2012   LDLCALC 150 (H) 02/26/2018   ALT 6 04/09/2018   AST 34 04/09/2018   NA 135 04/09/2018   K 4.3 04/09/2018   CL 95 (L) 04/09/2018   CREATININE 15.69 (H) 04/09/2018   BUN 46 (H) 04/09/2018   CO2 19 (L) 04/09/2018   TSH 4.10 03/07/2018   INR 1.0 11/26/2017   HGBA1C 6.6  (A) 03/07/2018   MICROALBUR renal 04/12/2018    Ct Abdomen Pelvis W Contrast  Result Date: 04/09/2018 CLINICAL DATA:  Nausea and vomiting and abdominal pain EXAM: CT ABDOMEN AND PELVIS WITH CONTRAST TECHNIQUE: Multidetector CT imaging of the abdomen and pelvis was performed using the standard protocol following bolus administration of intravenous contrast. CONTRAST:  16m OMNIPAQUE IOHEXOL 300 MG/ML  SOLN COMPARISON:  03/27/2018 FINDINGS: Lower chest: No acute abnormality. Hepatobiliary:  Diffuse fatty infiltration of the liver is noted. No focal mass is seen. The gallbladder has been surgically removed. Pancreas: Unremarkable. No pancreatic ductal dilatation or surrounding inflammatory changes. Spleen: Normal in size without focal abnormality. Adrenals/Urinary Tract: Adrenal glands are within normal limits. Renal vascular calcifications are noted. No obstructive changes are seen. The ureters are within normal limits bilaterally. The bladder is decompressed. Stomach/Bowel: Scattered diverticular changes noted within the colon. No evidence of diverticulitis is seen. The appendix is within normal limits. No obstructive or inflammatory changes of the small bowel are noted. Changes of prior sleeve gastrectomy are seen. Small sliding-type hiatal hernia is noted. Vascular/Lymphatic: Aortic atherosclerosis. No enlarged abdominal or pelvic lymph nodes. Reproductive: Uterus and bilateral adnexa are unremarkable. Other: Peritoneal dialysis catheter is noted deep within the pelvis. Small amount of free fluid is noted within the pelvis associated with the catheter. Additionally there are a few foci of air identified in the upper abdomen adjacent to the spleen and stomach. These are likely related to the peritoneal dialysis catheter. By history the patient is receiving treatment for peritonitis. Musculoskeletal: No acute bony abnormality is seen. IMPRESSION: Minimal free air and free fluid within the abdomen likely related  to the patient's known peritoneal dialysis catheter. No definitive evidence to suggest perforation are seen. Diverticulosis without diverticulitis. Fatty liver. Electronically Signed   By: Inez Catalina M.D.   On: 04/09/2018 16:50    Assessment & Plan:   Timika was seen today for abdominal pain.  Diagnoses and all orders for this visit:  Peritonitis associated with peritoneal dialysis, subsequent encounter ([place])- I recommended that she try to control the pain with oxycodone. -     oxyCODONE (OXY IR/ROXICODONE) 5 MG immediate release tablet; Take 1 tablet (5 mg total) by mouth every 6 (six) hours as needed for severe pain.  Coronary artery disease due to lipid rich plaque- She is tolerating the high-dose statin well. -     atorvastatin (LIPITOR) 80 MG tablet; Take 1 tablet (80 mg total) by mouth daily at 6 PM.  Early satiety- I have asked her to see GI to see if she needs to undergo upper endoscopy to screen for peptic ulcer disease and helicobacter pylori infection. -     Ambulatory referral to Gastroenterology   I have discontinued [place] L[place]. Goggans-Brockman's fluconazole. I am also having her start on oxyCODONE. Additionally, I am having her maintain her calcium acetate, Auryxia, gentamicin cream, Cyanocobalamin (B-12 PO), acetaminophen, aspirin, carvedilol, cinacalcet, folic acid, ondansetron, and atorvastatin.  Meds ordered this encounter  Medications   oxyCODONE (OXY IR/ROXICODONE) 5 MG immediate release tablet    Sig: Take 1 tablet (5 mg total) by mouth every 6 (six) hours as needed for severe pain.    Dispense:  45 tablet    Refill:  0   atorvastatin (LIPITOR) 80 MG tablet    Sig: Take 1 tablet (80 mg total) by mouth daily at 6 PM.    Dispense:  90 tablet    Refill:  1     Follow-up: Return if symptoms worsen or fail to improve.  Scarlette Calico, MD

## 2018-04-12 NOTE — Telephone Encounter (Signed)
Copied from [place] [id number]. Topic: Quick Communication - See Telephone Encounter >> Apr 12, 2018 11:19 AM Bea Graff, NT wrote: CRM for notification. See Telephone encounter for: 04/12/18. Pt states that [facility] stated the oxycodone needs PA. Please advise.

## 2018-04-12 NOTE — Patient Instructions (Signed)
Abdominal Pain, Adult  Abdominal pain can be caused by many things. Often, abdominal pain is not serious and it gets better with no treatment or by being treated at home. However, sometimes abdominal pain is serious. Your health care provider will do a medical history and a physical exam to try to determine the cause of your abdominal pain.  Follow these instructions at home:   Take over-the-counter and prescription medicines only as told by your health care provider. Do not take a laxative unless told by your health care provider.   Drink enough fluid to keep your urine clear or pale yellow.   Watch your condition for any changes.   Keep all follow-up visits as told by your health care provider. This is important.  Contact a health care provider if:   Your abdominal pain changes or gets worse.   You are not hungry or you lose weight without trying.   You are constipated or have diarrhea for more than 2-3 days.   You have pain when you urinate or have a bowel movement.   Your abdominal pain wakes you up at night.   Your pain gets worse with meals, after eating, or with certain foods.   You are throwing up and cannot keep anything down.   You have a fever.  Get help right away if:   Your pain does not go away as soon as your health care provider told you to expect.   You cannot stop throwing up.   Your pain is only in areas of the abdomen, such as the right side or the left lower portion of the abdomen.   You have bloody or black stools, or stools that look like tar.   You have severe pain, cramping, or bloating in your abdomen.   You have signs of dehydration, such as:  ? Dark urine, very little urine, or no urine.  ? Cracked lips.  ? Dry mouth.  ? Sunken eyes.  ? Sleepiness.  ? Weakness.  This information is not intended to replace advice given to you by your health care provider. Make sure you discuss any questions you have with your health care provider.  Document Released: 10/15/2004 Document  Revised: 07/26/2015 Document Reviewed: 06/19/2015  Elsevier Interactive Patient Education  2019 [employer] Inc[employer]

## 2018-04-13 NOTE — Telephone Encounter (Signed)
I will wait

## 2018-04-13 NOTE — Telephone Encounter (Addendum)
Spoke to pharmacy and they stated that they can fill for 7 days without a PA.  Spoke to pt and she stated that she would rather do that than wait for the approval. The remaining amount would be 17 tablets.  Do you want to send in a new rx for the remainder or wait until pt calls in for a refill?

## 2018-04-15 ENCOUNTER — Other Ambulatory Visit: Payer: Self-pay

## 2018-04-15 ENCOUNTER — Telehealth: Payer: 59 | Admitting: Gastroenterology

## 2018-04-15 ENCOUNTER — Other Ambulatory Visit: Payer: 59

## 2018-04-15 DIAGNOSIS — R110 Nausea: Secondary | ICD-10-CM

## 2018-04-15 DIAGNOSIS — R6881 Early satiety: Secondary | ICD-10-CM

## 2018-04-15 NOTE — Patient Instructions (Addendum)
    I explained to her that I think most of her symptoms are from her peritonitis.  She completed antibiotics just 2 days ago and it may take at least another couple weeks before she is feeling much better.   She knows that my office will call her to arrange for H. pylori stool testing and then after that testing has been completed she will start taking a single omeprazole over-the-counter strength 20 mg pill once daily.  She will call in 4 or 5 weeks to report on her response.

## 2018-04-15 NOTE — Progress Notes (Signed)
This service was provided via virtual visit.  The patient was located at home.  I was located in my office.  The patient did consent to this virtual visit and is aware of possible charges through their insurance for this visit.  She is an established patient.  Nobody else participated in this virtual service however my certified medical assistant  helped with follow-up recommendations.  Time spent on virtual visit: 31 min   HPI: This is a very pleasant 53 year old woman whom I met today through a WebEx, virtual visit   I understand that her nephrologist has been treating her for bacterial peritonitis (she does peritoneal dialysis).    Sleeve gastrectomy 2014, persistent vomiting aftewards, EGD Dr. Ardis Hughs found normal sleeve, slight narrowing at pylorus was dilated to 69m, not sure if that was her problem however.  She was here in our office and saw JAnderson [place]11/2019 for possible new diagnosis of cirrhosis. She underwent a battery of lab tests to determin cuase of her cirrhosis.  Felt likely NASH related cirrhosis.  Offered MRI for elevated lipase (?)   CT scan 03/27/2018 for lower abdominal pain (IV and oral contrast) 1. Steatosis of the liver with slight morphologic changes of cirrhosis given surface nodularity of the liver. No space-occupying mass is seen. 2. Status post cholecystectomy. 3. Gastric sleeve procedure without complicating features. 4. Sigmoid diverticulosis without acute diverticulitis. 5. Percutaneous peritoneal dialysis catheter is seen coiled pelvis without complicating features.  Ct scan 3/21 for nausea, vomiting, abd pain (iv and oral contrast) Minimal free air and free fluid within the abdomen likely related to the patient's known peritoneal dialysis catheter. No definitive evidence to suggest perforation are seen. Diverticulosis without diverticulitis. Fatty liver.  Labs 04/09/2018: cmet  Cr 15, lfts normal Cbc wbc normal, Hb normal  Peritoneal dialysate  culture 03/28/2018): +++ for gram positive cocci (staphylococcus waneri)    PCP note 04/12/2018: Early satiety- I have asked her to see GI to see if she needs to undergo upper endoscopy to screen for peptic ulcer disease and helicobacter pylori infection.   Diarrhea has improved.  She feels early satiety for the past 3 weeks.  A lot of fatigue.  Sleeping a lot.  A lot of stomach pains.  She had peritonitis; gets antibiotics directly into her peitoneum fluid.  Antibiotics last dose 2 days.  Has had this every other day 4 weeks.  Dialysis people do not think  Pains and early satiety stated   Pains top and bottom of abd.  No fevers or chills.  + nausea.  She has lost 30 pounds.  Eating makes the abd pain  She is not on ppi  Has no beartburn.   Chief complaint is nausea, early satiety, weight loss  ROS: complete GI ROS as described in HPI, all other review negative.  Constitutional:  No unintentional weight loss   Past Medical History:  Diagnosis Date  . Anemia   . Chronic kidney disease    STAGE 3 CHRONIC KIDNEY DISEASE SECONDARY TO DIABETIC GLOMERULOSCLEROSIS AND UNCONTROLLED HYPERTENSION - PER OFFICE NOTES DR. [facility]  .Marland KitchenESRD on dialysis ([facility]    "MWF; [address] (05/06/2016)  . Gout   . HCAP (healthcare-associated pneumonia) 05/06/2016  . History of blood transfusion    "related to surgery"  . Hyperlipidemia   . Hypertension   . Morbid obesity ([place]   . Pain    LEFT SHOULDER PAIN - WAS SEEN AT AN URGENT CARE - GIVEN SLING  FOR COMFORT AND TOLD ROM AS TOLERATED.  Marland Kitchen Palpitations 09/24/2016  . Type II diabetes mellitus ([place])    "gastric sleeve OR corrected this" (05/06/2016)    Past Surgical History:  Procedure Laterality Date  . AV FISTULA PLACEMENT Left 01/03/2015   Procedure: BRACHIAL CEPHALIC ARTERIOVENOUS  FISTULA CREATION LEFT ARM;  Surgeon: Angelia Mould, MD;  Location: [place];  Service: Vascular;  Laterality: Left;  .  CARPAL TUNNEL RELEASE Right   . [place]  . CHOLECYSTECTOMY  09/14/2017  . ESOPHAGOGASTRODUODENOSCOPY N/A 09/04/2012   Procedure: ESOPHAGOGASTRODUODENOSCOPY (EGD);  Surgeon: Shann Medal, MD;  Location: [facility];  Service: General;  Laterality: N/A;  PF  . ESOPHAGOGASTRODUODENOSCOPY (EGD) WITH ESOPHAGEAL DILATION N/A 09/29/2012   Procedure: ESOPHAGOGASTRODUODENOSCOPY (EGD) WITH ESOPHAGEAL DILATION;  Surgeon: Milus Banister, MD;  Location: [facility];  Service: Endoscopy;  Laterality: N/A;  . INSERTION OF DIALYSIS CATHETER N/A 01/03/2015   Procedure: INSERTION OF DIALYSIS CATHETER RIGHT INTERNAL JUGULAR;  Surgeon: Angelia Mould, MD;  Location: [place];  Service: Vascular;  Laterality: N/A;  . LAPAROSCOPIC GASTRIC SLEEVE RESECTION N/A 07/19/2012   Procedure: LAPAROSCOPIC SLEEVE GASTRECTOMY with EGD;  Surgeon: Madilyn Hook, DO;  Location: [facility];  Service: General;  Laterality: N/A;  laparoscopic sleeve gastrectomy with EGD  . LEFT HEART CATH AND CORONARY ANGIOGRAPHY N/A 02/25/2018   Procedure: LEFT HEART CATH AND CORONARY ANGIOGRAPHY;  Surgeon: Leonie Man, MD;  Location: [place] [facility];  Service: Cardiovascular;  Laterality: N/A;  . PERIPHERAL VASCULAR CATHETERIZATION Left 05/23/2015   Procedure: Nolon Stalls;  Surgeon: Conrad Jesup, MD;  Location: [facility];  Service: Cardiovascular;  Laterality: Left;  upper aRM  . PERIPHERAL VASCULAR CATHETERIZATION Left 05/23/2015   Procedure: Peripheral Vascular Balloon Angioplasty;  Surgeon: Conrad [place], MD;  Location: [place];  Service: Cardiovascular;  Laterality: Left;  av fistula  . TUBAL LIGATION  1993  . UPPER GI ENDOSCOPY N/A 07/19/2012   Procedure: UPPER GI ENDOSCOPY;  Surgeon: Madilyn Hook, DO;  Location: [facility];  Service: General;  Laterality: N/A;    Current Outpatient Medications  Medication Sig Dispense Refill  . acetaminophen (TYLENOL) 325 MG tablet Take 650 mg by mouth every 6 (six)  hours as needed for mild pain.    Marland Kitchen aspirin EC 81 MG EC tablet Take 1 tablet (81 mg total) by mouth daily. 30 tablet 0  . atorvastatin (LIPITOR) 80 MG tablet Take 1 tablet (80 mg total) by mouth daily at 6 PM. 90 tablet 1  . AURYXIA 1 GM 210 MG(Fe) tablet Take 210 mg by mouth 3 (three) times daily with meals.   3  . calcium acetate (PHOSLO) 667 MG capsule Take 2 capsules (1,334 mg total) by mouth 3 (three) times daily with meals. 180 capsule 0  . carvedilol (COREG) 3.125 MG tablet Take 1 tablet (3.125 mg total) by mouth 2 (two) times daily with a meal. 60 tablet 0  . cinacalcet (SENSIPAR) 30 MG tablet Take 2 tablets (60 mg total) by mouth daily with supper. 30 tablet 0  . Cyanocobalamin (B-12 PO) Take 1 tablet by mouth daily.    . folic acid (FOLVITE) 1 MG tablet Take 1 tablet (1 mg total) by mouth daily. 90 tablet 1  . gentamicin cream (GARAMYCIN) 0.1 % Apply 1 application topically See admin instructions. Apply to exit site daily as directed.  4  . ondansetron (ZOFRAN ODT) 8 MG disintegrating tablet Take 1 tablet (8  mg total) by mouth every 8 (eight) hours as needed for nausea or vomiting. 10 tablet 0  . oxyCODONE (OXY IR/ROXICODONE) 5 MG immediate release tablet Take 1 tablet (5 mg total) by mouth every 6 (six) hours as needed for severe pain. 45 tablet 0   No current facility-administered medications for this visit.     Allergies as of 04/15/2018 - Review Complete 04/12/2018  Allergen Reaction Noted  . Amlodipine Swelling 12/07/2011  . Clonidine derivatives Swelling 03/22/2012  . Doxycycline Nausea And Vomiting 03/27/2018  . Welchol [colesevelam hcl] Nausea Only 06/28/2012    Family History  Problem Relation Age of Onset  . Hypertension Mother   . Mitral valve prolapse Mother   . Diabetes Father   . Arthritis Other   . Hyperlipidemia Other   . Cancer Neg Hx   . Heart disease Neg Hx   . Kidney disease Neg Hx   . Stroke Neg Hx     Social History   Socioeconomic History  .  Marital status: Married    Spouse name: Not on file  . Number of children: 3  . Years of education: Not on file  . Highest education level: Not on file  Occupational History  . Occupation: Research scientist (physical sciences): [employer]  Social Needs  . Financial resource strain: Not on file  . Food insecurity:    Worry: Not on file    Inability: Not on file  . Transportation needs:    Medical: Not on file    Non-medical: Not on file  Tobacco Use  . Smoking status: Former Smoker    Packs/day: 1.00    Years: 16.00    Pack years: 16.00    Types: Cigarettes    Last attempt to quit: 2009    Years since quitting: 11.2  . Smokeless tobacco: Never Used  Substance and Sexual Activity  . Alcohol use: Never    Frequency: Never  . Drug use: No  . Sexual activity: Yes    Birth control/protection: Post-menopausal  Lifestyle  . Physical activity:    Days per week: Not on file    Minutes per session: Not on file  . Stress: Not on file  Relationships  . Social connections:    Talks on phone: Not on file    Gets together: Not on file    Attends religious service: Not on file    Active member of club or organization: Not on file    Attends meetings of clubs or organizations: Not on file    Relationship status: Not on file  . Intimate partner violence:    Fear of current or ex partner: Not on file    Emotionally abused: Not on file    Physically abused: Not on file    Forced sexual activity: Not on file  Other Topics Concern  . Not on file  Social History Narrative  . Not on file     Physical Exam: Unable to perform because this was a "telemed visit" due to current Covid-19 pandemic  Assessment and plan: 53 y.o. female with nausea, early satiety, weight loss  All of her symptoms started about 3 weeks ago when she was diagnosed with Staphylococcus in her peritoneum related to her peritoneal dialysis.  I think very most likely these symptoms are related to her peritonitis.  She has been  undergoing every other day peritoneal washings with antibiotics.  Her last day was 2 days ago.  I explained to her that  I think it could take a few more days before these symptoms improve.  I think it is certainly reasonable to check her for H. pylori by stool testing and then after she drops off the stool testing she can start a single over-the-counter omeprazole once daily.  She will call my office to report on her response in 3 to 4 weeks and certainly I will be getting in touch with her before then with her stool testing results.  Please see the "Patient Instructions" section for addition details about the plan.  Owens Loffler, MD [facility] 04/15/2018, 10:32 AM

## 2018-04-18 ENCOUNTER — Inpatient Hospital Stay (HOSPITAL_COMMUNITY)
Admission: EM | Admit: 2018-04-18 | Discharge: 2018-04-26 | DRG: 871 | Disposition: A | Discharge status: Home Health | Payer: Medicare Other | Attending: Internal Medicine | Admitting: Internal Medicine

## 2018-04-18 ENCOUNTER — Other Ambulatory Visit: Payer: 59

## 2018-04-18 ENCOUNTER — Encounter (HOSPITAL_COMMUNITY): Payer: Self-pay | Admitting: Student

## 2018-04-18 ENCOUNTER — Other Ambulatory Visit: Payer: Self-pay

## 2018-04-18 ENCOUNTER — Inpatient Hospital Stay (HOSPITAL_COMMUNITY): Payer: Medicare Other

## 2018-04-18 ENCOUNTER — Emergency Department (HOSPITAL_COMMUNITY): Payer: Medicare Other

## 2018-04-18 DIAGNOSIS — E785 Hyperlipidemia, unspecified: Secondary | ICD-10-CM | POA: Diagnosis present

## 2018-04-18 DIAGNOSIS — Z992 Dependence on renal dialysis: Secondary | ICD-10-CM

## 2018-04-18 DIAGNOSIS — G253 Myoclonus: Secondary | ICD-10-CM | POA: Diagnosis not present

## 2018-04-18 DIAGNOSIS — I10 Essential (primary) hypertension: Secondary | ICD-10-CM | POA: Diagnosis not present

## 2018-04-18 DIAGNOSIS — D638 Anemia in other chronic diseases classified elsewhere: Secondary | ICD-10-CM

## 2018-04-18 DIAGNOSIS — Z87891 Personal history of nicotine dependence: Secondary | ICD-10-CM

## 2018-04-18 DIAGNOSIS — N2581 Secondary hyperparathyroidism of renal origin: Secondary | ICD-10-CM | POA: Diagnosis present

## 2018-04-18 DIAGNOSIS — M109 Gout, unspecified: Secondary | ICD-10-CM | POA: Diagnosis present

## 2018-04-18 DIAGNOSIS — E1122 Type 2 diabetes mellitus with diabetic chronic kidney disease: Secondary | ICD-10-CM | POA: Diagnosis present

## 2018-04-18 DIAGNOSIS — R0602 Shortness of breath: Secondary | ICD-10-CM

## 2018-04-18 DIAGNOSIS — Z881 Allergy status to other antibiotic agents status: Secondary | ICD-10-CM

## 2018-04-18 DIAGNOSIS — N186 End stage renal disease: Secondary | ICD-10-CM | POA: Diagnosis present

## 2018-04-18 DIAGNOSIS — I2583 Coronary atherosclerosis due to lipid rich plaque: Secondary | ICD-10-CM | POA: Diagnosis present

## 2018-04-18 DIAGNOSIS — D519 Vitamin B12 deficiency anemia, unspecified: Secondary | ICD-10-CM | POA: Diagnosis present

## 2018-04-18 DIAGNOSIS — E860 Dehydration: Secondary | ICD-10-CM | POA: Diagnosis present

## 2018-04-18 DIAGNOSIS — R109 Unspecified abdominal pain: Secondary | ICD-10-CM | POA: Diagnosis not present

## 2018-04-18 DIAGNOSIS — A419 Sepsis, unspecified organism: Principal | ICD-10-CM | POA: Diagnosis present

## 2018-04-18 DIAGNOSIS — R4701 Aphasia: Secondary | ICD-10-CM | POA: Diagnosis not present

## 2018-04-18 DIAGNOSIS — E538 Deficiency of other specified B group vitamins: Secondary | ICD-10-CM

## 2018-04-18 DIAGNOSIS — D631 Anemia in chronic kidney disease: Secondary | ICD-10-CM | POA: Diagnosis present

## 2018-04-18 DIAGNOSIS — G9341 Metabolic encephalopathy: Secondary | ICD-10-CM | POA: Diagnosis present

## 2018-04-18 DIAGNOSIS — E1165 Type 2 diabetes mellitus with hyperglycemia: Secondary | ICD-10-CM | POA: Diagnosis present

## 2018-04-18 DIAGNOSIS — E871 Hypo-osmolality and hyponatremia: Secondary | ICD-10-CM | POA: Diagnosis present

## 2018-04-18 DIAGNOSIS — Z79899 Other long term (current) drug therapy: Secondary | ICD-10-CM

## 2018-04-18 DIAGNOSIS — A09 Infectious gastroenteritis and colitis, unspecified: Secondary | ICD-10-CM

## 2018-04-18 DIAGNOSIS — D510 Vitamin B12 deficiency anemia due to intrinsic factor deficiency: Secondary | ICD-10-CM | POA: Diagnosis not present

## 2018-04-18 DIAGNOSIS — K659 Peritonitis, unspecified: Secondary | ICD-10-CM | POA: Diagnosis present

## 2018-04-18 DIAGNOSIS — K7460 Unspecified cirrhosis of liver: Secondary | ICD-10-CM | POA: Diagnosis present

## 2018-04-18 DIAGNOSIS — E1129 Type 2 diabetes mellitus with other diabetic kidney complication: Secondary | ICD-10-CM | POA: Diagnosis present

## 2018-04-18 DIAGNOSIS — M1030 Gout due to renal impairment, unspecified site: Secondary | ICD-10-CM | POA: Diagnosis present

## 2018-04-18 DIAGNOSIS — R6881 Early satiety: Secondary | ICD-10-CM | POA: Diagnosis present

## 2018-04-18 DIAGNOSIS — E8889 Other specified metabolic disorders: Secondary | ICD-10-CM | POA: Diagnosis present

## 2018-04-18 DIAGNOSIS — Z8249 Family history of ischemic heart disease and other diseases of the circulatory system: Secondary | ICD-10-CM

## 2018-04-18 DIAGNOSIS — T8571XA Infection and inflammatory reaction due to peritoneal dialysis catheter, initial encounter: Secondary | ICD-10-CM | POA: Diagnosis present

## 2018-04-18 DIAGNOSIS — Z794 Long term (current) use of insulin: Secondary | ICD-10-CM

## 2018-04-18 DIAGNOSIS — N269 Renal sclerosis, unspecified: Secondary | ICD-10-CM | POA: Diagnosis present

## 2018-04-18 DIAGNOSIS — Z9049 Acquired absence of other specified parts of digestive tract: Secondary | ICD-10-CM

## 2018-04-18 DIAGNOSIS — T8571XD Infection and inflammatory reaction due to peritoneal dialysis catheter, subsequent encounter: Secondary | ICD-10-CM | POA: Diagnosis not present

## 2018-04-18 DIAGNOSIS — R7989 Other specified abnormal findings of blood chemistry: Secondary | ICD-10-CM

## 2018-04-18 DIAGNOSIS — Z833 Family history of diabetes mellitus: Secondary | ICD-10-CM

## 2018-04-18 DIAGNOSIS — R110 Nausea: Secondary | ICD-10-CM

## 2018-04-18 DIAGNOSIS — K7290 Hepatic failure, unspecified without coma: Secondary | ICD-10-CM | POA: Diagnosis present

## 2018-04-18 DIAGNOSIS — R945 Abnormal results of liver function studies: Secondary | ICD-10-CM | POA: Diagnosis not present

## 2018-04-18 DIAGNOSIS — Z6841 Body Mass Index (BMI) 40.0 and over, adult: Secondary | ICD-10-CM | POA: Diagnosis not present

## 2018-04-18 DIAGNOSIS — I132 Hypertensive heart and chronic kidney disease with heart failure and with stage 5 chronic kidney disease, or end stage renal disease: Secondary | ICD-10-CM | POA: Diagnosis present

## 2018-04-18 DIAGNOSIS — K7581 Nonalcoholic steatohepatitis (NASH): Secondary | ICD-10-CM | POA: Diagnosis present

## 2018-04-18 DIAGNOSIS — R0609 Other forms of dyspnea: Secondary | ICD-10-CM | POA: Diagnosis not present

## 2018-04-18 DIAGNOSIS — I2510 Atherosclerotic heart disease of native coronary artery without angina pectoris: Secondary | ICD-10-CM | POA: Diagnosis present

## 2018-04-18 DIAGNOSIS — E876 Hypokalemia: Secondary | ICD-10-CM | POA: Diagnosis present

## 2018-04-18 DIAGNOSIS — R0600 Dyspnea, unspecified: Secondary | ICD-10-CM

## 2018-04-18 DIAGNOSIS — R4182 Altered mental status, unspecified: Secondary | ICD-10-CM | POA: Diagnosis not present

## 2018-04-18 DIAGNOSIS — R509 Fever, unspecified: Secondary | ICD-10-CM | POA: Diagnosis present

## 2018-04-18 DIAGNOSIS — I509 Heart failure, unspecified: Secondary | ICD-10-CM | POA: Diagnosis present

## 2018-04-18 DIAGNOSIS — Z9884 Bariatric surgery status: Secondary | ICD-10-CM

## 2018-04-18 DIAGNOSIS — Z7982 Long term (current) use of aspirin: Secondary | ICD-10-CM

## 2018-04-18 DIAGNOSIS — Z888 Allergy status to other drugs, medicaments and biological substances status: Secondary | ICD-10-CM

## 2018-04-18 DIAGNOSIS — I252 Old myocardial infarction: Secondary | ICD-10-CM

## 2018-04-18 DIAGNOSIS — G9340 Encephalopathy, unspecified: Secondary | ICD-10-CM | POA: Diagnosis not present

## 2018-04-18 DIAGNOSIS — R9401 Abnormal electroencephalogram [EEG]: Secondary | ICD-10-CM | POA: Diagnosis present

## 2018-04-18 DIAGNOSIS — L299 Pruritus, unspecified: Secondary | ICD-10-CM | POA: Diagnosis present

## 2018-04-18 LAB — COMPREHENSIVE METABOLIC PANEL
ALT: 8 U/L (ref 0–44)
AST: 219 U/L — ABNORMAL HIGH (ref 15–41)
Albumin: 2.3 g/dL — ABNORMAL LOW (ref 3.5–5.0)
Alkaline Phosphatase: 150 U/L — ABNORMAL HIGH (ref 38–126)
Anion gap: 17 — ABNORMAL HIGH (ref 5–15)
BUN: 35 mg/dL — ABNORMAL HIGH (ref 6–20)
CO2: 20 mmol/L — ABNORMAL LOW (ref 22–32)
Calcium: 6.6 mg/dL — ABNORMAL LOW (ref 8.9–10.3)
Chloride: 94 mmol/L — ABNORMAL LOW (ref 98–111)
Creatinine, Ser: 15.69 mg/dL — ABNORMAL HIGH (ref 0.44–1.00)
GFR calc Af Amer: 3 mL/min — ABNORMAL LOW (ref >60)
GFR calc non Af Amer: 2 mL/min — ABNORMAL LOW (ref >60)
Glucose, Bld: 136 mg/dL — ABNORMAL HIGH (ref 70–99)
Potassium: 2.6 mmol/L — CL (ref 3.5–5.1)
Sodium: 131 mmol/L — ABNORMAL LOW (ref 135–145)
Total Bilirubin: 0.4 mg/dL (ref 0.3–1.2)
Total Protein: 5.5 g/dL — ABNORMAL LOW (ref 6.5–8.1)

## 2018-04-18 LAB — CBC WITH DIFFERENTIAL/PLATELET
Abs Immature Granulocytes: 0.13 10*3/uL — ABNORMAL HIGH (ref 0.00–0.07)
Basophils Absolute: 0 10*3/uL (ref 0.0–0.1)
Basophils Relative: 0 %
Eosinophils Absolute: 0.2 10*3/uL (ref 0.0–0.5)
Eosinophils Relative: 3 %
HCT: 37.1 % (ref 36.0–46.0)
Hemoglobin: 12 g/dL (ref 12.0–15.0)
Immature Granulocytes: 3 %
Lymphocytes Relative: 9 %
Lymphs Abs: 0.4 10*3/uL — ABNORMAL LOW (ref 0.7–4.0)
MCH: 31.9 pg (ref 26.0–34.0)
MCHC: 32.3 g/dL (ref 30.0–36.0)
MCV: 98.7 fL (ref 80.0–100.0)
Monocytes Absolute: 0.3 10*3/uL (ref 0.1–1.0)
Monocytes Relative: 6 %
Neutro Abs: 3.7 10*3/uL (ref 1.7–7.7)
Neutrophils Relative %: 79 %
Platelets: 205 10*3/uL (ref 150–400)
RBC: 3.76 MIL/uL — ABNORMAL LOW (ref 3.87–5.11)
RDW: 14.4 % (ref 11.5–15.5)
WBC Morphology: INCREASED
WBC: 4.7 10*3/uL (ref 4.0–10.5)
nRBC: 0 % (ref 0.0–0.2)

## 2018-04-18 LAB — GLUCOSE, PLEURAL OR PERITONEAL FLUID: Glucose, Fluid: 526 mg/dL

## 2018-04-18 LAB — CBG MONITORING, ED: Glucose-Capillary: 127 mg/dL — ABNORMAL HIGH (ref 70–99)

## 2018-04-18 LAB — URINALYSIS, ROUTINE W REFLEX MICROSCOPIC
Bacteria, UA: NONE SEEN
Bilirubin Urine: NEGATIVE
Glucose, UA: >=500 mg/dL — AB
Ketones, ur: NEGATIVE mg/dL
Leukocytes,Ua: NEGATIVE
Nitrite: NEGATIVE
Protein, ur: 30 mg/dL — AB
Specific Gravity, Urine: 1.005 (ref 1.005–1.030)
pH: 8 (ref 5.0–8.0)

## 2018-04-18 LAB — PROCALCITONIN: Procalcitonin: 3.69 ng/mL

## 2018-04-18 LAB — PHOSPHORUS: Phosphorus: 4.5 mg/dL (ref 2.5–4.6)

## 2018-04-18 LAB — ALBUMIN, PLEURAL OR PERITONEAL FLUID: Albumin, Fluid: <1 g/dL

## 2018-04-18 LAB — LACTIC ACID, PLASMA
Lactic Acid, Venous: 2.2 mmol/L (ref 0.5–1.9)
Lactic Acid, Venous: 0.8 mmol/L (ref 0.5–1.9)
Lactic Acid, Venous: 1.4 mmol/L (ref 0.5–1.9)

## 2018-04-18 LAB — PROTEIN, PLEURAL OR PERITONEAL FLUID: Total protein, fluid: <3 g/dL

## 2018-04-18 LAB — LACTATE DEHYDROGENASE, PLEURAL OR PERITONEAL FLUID: LD, Fluid: 11 U/L (ref 3–23)

## 2018-04-18 LAB — MAGNESIUM: Magnesium: 1.5 mg/dL — ABNORMAL LOW (ref 1.7–2.4)

## 2018-04-18 LAB — GRAM STAIN

## 2018-04-18 LAB — GLUCOSE, CAPILLARY
Glucose-Capillary: 95 mg/dL (ref 70–99)
Glucose-Capillary: 98 mg/dL (ref 70–99)

## 2018-04-18 LAB — TSH: TSH: 3.798 u[IU]/mL (ref 0.350–4.500)

## 2018-04-18 LAB — LIPASE, BLOOD: Lipase: 26 U/L (ref 11–51)

## 2018-04-18 LAB — MRSA PCR SCREENING: MRSA by PCR: NEGATIVE

## 2018-04-18 MED ORDER — ASPIRIN EC 81 MG PO TBEC
81.0000 mg | DELAYED_RELEASE_TABLET | Freq: Every day | ORAL | Status: DC
  Administered 2018-04-18 – 2018-04-26 (×4): 81 mg via ORAL
  Filled 2018-04-18 (×8): qty 1

## 2018-04-18 MED ORDER — HEPARIN SODIUM (PORCINE) 5000 UNIT/ML IJ SOLN
5000.0000 [IU] | Freq: Three times a day (TID) | INTRAMUSCULAR | Status: DC
  Administered 2018-04-18 – 2018-04-26 (×22): 5000 [IU] via SUBCUTANEOUS
  Filled 2018-04-18 (×22): qty 1

## 2018-04-18 MED ORDER — VANCOMYCIN HCL 10 G IV SOLR
2500.0000 mg | Freq: Once | INTRAVENOUS | Status: AC
  Administered 2018-04-18: 2500 mg via INTRAVENOUS
  Filled 2018-04-18: qty 2500

## 2018-04-18 MED ORDER — SODIUM CHLORIDE 0.9 % IV SOLN: 2.0000 g | Freq: Once | INTRAVENOUS | Status: DC

## 2018-04-18 MED ORDER — METRONIDAZOLE IN NACL 5-0.79 MG/ML-% IV SOLN: 500.0000 mg | Freq: Once | INTRAVENOUS | Status: DC

## 2018-04-18 MED ORDER — ONDANSETRON HCL 4 MG/2ML IJ SOLN: 4.0000 mg | Freq: Four times a day (QID) | INTRAMUSCULAR | Status: DC | PRN

## 2018-04-18 MED ORDER — LEVALBUTEROL HCL 0.63 MG/3ML IN NEBU
0.6300 mg | INHALATION_SOLUTION | Freq: Four times a day (QID) | RESPIRATORY_TRACT | Status: DC
  Administered 2018-04-18: 0.63 mg via RESPIRATORY_TRACT
  Filled 2018-04-18: qty 3

## 2018-04-18 MED ORDER — SODIUM CHLORIDE 0.9 % IV BOLUS
500.0000 mL | Freq: Once | INTRAVENOUS | Status: AC
  Administered 2018-04-18: 500 mL via INTRAVENOUS

## 2018-04-18 MED ORDER — FERRIC CITRATE 1 GM 210 MG(FE) PO TABS
210.0000 mg | ORAL_TABLET | Freq: Three times a day (TID) | ORAL | Status: DC
  Administered 2018-04-18 – 2018-04-26 (×10): 210 mg via ORAL
  Filled 2018-04-18 (×14): qty 1

## 2018-04-18 MED ORDER — VANCOMYCIN HCL IN DEXTROSE 1-5 GM/200ML-% IV SOLN: 1000.0000 mg | Freq: Once | INTRAVENOUS | Status: DC

## 2018-04-18 MED ORDER — MAGNESIUM SULFATE IN D5W 1-5 GM/100ML-% IV SOLN
1.0000 g | Freq: Once | INTRAVENOUS | Status: AC
  Administered 2018-04-18: 1 g via INTRAVENOUS
  Filled 2018-04-18: qty 100

## 2018-04-18 MED ORDER — CALCIUM ACETATE (PHOS BINDER) 667 MG PO CAPS
1334.0000 mg | ORAL_CAPSULE | Freq: Three times a day (TID) | ORAL | Status: DC
  Administered 2018-04-18 – 2018-04-24 (×7): 1334 mg via ORAL
  Filled 2018-04-18 (×11): qty 2

## 2018-04-18 MED ORDER — FOLIC ACID 1 MG PO TABS
1.0000 mg | ORAL_TABLET | Freq: Every day | ORAL | Status: DC
  Administered 2018-04-18 – 2018-04-26 (×5): 1 mg via ORAL
  Filled 2018-04-18 (×8): qty 1

## 2018-04-18 MED ORDER — OXYCODONE HCL 5 MG PO TABS
5.0000 mg | ORAL_TABLET | Freq: Four times a day (QID) | ORAL | Status: DC | PRN
  Administered 2018-04-18 – 2018-04-19 (×3): 5 mg via ORAL
  Filled 2018-04-18 (×3): qty 1

## 2018-04-18 MED ORDER — IPRATROPIUM BROMIDE 0.02 % IN SOLN
0.5000 mg | Freq: Four times a day (QID) | RESPIRATORY_TRACT | Status: DC
  Administered 2018-04-18: 0.5 mg via RESPIRATORY_TRACT
  Filled 2018-04-18: qty 2.5

## 2018-04-18 MED ORDER — SODIUM CHLORIDE 0.9 % IV SOLN
2.0000 g | Freq: Once | INTRAVENOUS | Status: AC
  Administered 2018-04-18: 2 g via INTRAVENOUS
  Filled 2018-04-18: qty 2

## 2018-04-18 MED ORDER — INSULIN ASPART 100 UNIT/ML ~~LOC~~ SOLN: 0.0000 [IU] | Freq: Every day | SUBCUTANEOUS | Status: DC

## 2018-04-18 MED ORDER — SODIUM CHLORIDE 0.9 % IV SOLN
INTRAVENOUS | Status: DC | PRN
  Administered 2018-04-18: 500 mL via INTRAVENOUS

## 2018-04-18 MED ORDER — IPRATROPIUM BROMIDE 0.02 % IN SOLN
0.5000 mg | Freq: Three times a day (TID) | RESPIRATORY_TRACT | Status: DC
  Administered 2018-04-19 – 2018-04-20 (×4): 0.5 mg via RESPIRATORY_TRACT
  Filled 2018-04-18 (×5): qty 2.5

## 2018-04-18 MED ORDER — VANCOMYCIN HCL 10 G IV SOLR
2000.0000 mg | Freq: Once | INTRAVENOUS | Status: DC
  Filled 2018-04-18: qty 2000

## 2018-04-18 MED ORDER — ACETAMINOPHEN 325 MG PO TABS
650.0000 mg | ORAL_TABLET | Freq: Four times a day (QID) | ORAL | Status: DC | PRN
  Administered 2018-04-18 – 2018-04-26 (×2): 650 mg via ORAL
  Filled 2018-04-18 (×2): qty 2

## 2018-04-18 MED ORDER — GUAIFENESIN ER 600 MG PO TB12
600.0000 mg | ORAL_TABLET | Freq: Two times a day (BID) | ORAL | Status: DC
  Administered 2018-04-18 – 2018-04-26 (×7): 600 mg via ORAL
  Filled 2018-04-18 (×14): qty 1

## 2018-04-18 MED ORDER — SODIUM CHLORIDE 0.9 % IV BOLUS: 500.0000 mL | INTRAVENOUS | Status: DC | PRN

## 2018-04-18 MED ORDER — SODIUM CHLORIDE 0.9 % IV SOLN
1.0000 g | INTRAVENOUS | Status: DC
  Administered 2018-04-19 – 2018-04-21 (×3): 1 g via INTRAVENOUS
  Filled 2018-04-18 (×5): qty 1

## 2018-04-18 MED ORDER — ALBUTEROL SULFATE (2.5 MG/3ML) 0.083% IN NEBU: 2.5000 mg | INHALATION_SOLUTION | RESPIRATORY_TRACT | Status: DC | PRN

## 2018-04-18 MED ORDER — METRONIDAZOLE IN NACL 5-0.79 MG/ML-% IV SOLN
500.0000 mg | Freq: Three times a day (TID) | INTRAVENOUS | Status: DC
  Administered 2018-04-18 – 2018-04-21 (×8): 500 mg via INTRAVENOUS
  Filled 2018-04-18 (×8): qty 100

## 2018-04-18 MED ORDER — ONDANSETRON HCL 4 MG PO TABS: 4.0000 mg | ORAL_TABLET | Freq: Four times a day (QID) | ORAL | Status: DC | PRN

## 2018-04-18 MED ORDER — CAMPHOR-MENTHOL 0.5-0.5 % EX LOTN
TOPICAL_LOTION | CUTANEOUS | Status: DC | PRN
  Administered 2018-04-18 – 2018-04-19 (×3): via TOPICAL
  Filled 2018-04-18: qty 222

## 2018-04-18 MED ORDER — ACETAMINOPHEN 325 MG PO TABS
650.0000 mg | ORAL_TABLET | Freq: Once | ORAL | Status: AC
  Administered 2018-04-18: 650 mg via ORAL
  Filled 2018-04-18: qty 2

## 2018-04-18 MED ORDER — CINACALCET HCL 30 MG PO TABS
60.0000 mg | ORAL_TABLET | Freq: Every day | ORAL | Status: DC
  Administered 2018-04-18: 60 mg via ORAL
  Filled 2018-04-18: qty 2

## 2018-04-18 MED ORDER — POTASSIUM CHLORIDE 10 MEQ/100ML IV SOLN
10.0000 meq | INTRAVENOUS | Status: AC
  Administered 2018-04-18 (×2): 10 meq via INTRAVENOUS
  Filled 2018-04-18 (×2): qty 100

## 2018-04-18 MED ORDER — METRONIDAZOLE IN NACL 5-0.79 MG/ML-% IV SOLN
500.0000 mg | Freq: Once | INTRAVENOUS | Status: AC
  Administered 2018-04-18: 500 mg via INTRAVENOUS
  Filled 2018-04-18: qty 100

## 2018-04-18 MED ORDER — INSULIN ASPART 100 UNIT/ML ~~LOC~~ SOLN
0.0000 [IU] | Freq: Three times a day (TID) | SUBCUTANEOUS | Status: DC
  Administered 2018-04-20: 1 [IU] via SUBCUTANEOUS
  Administered 2018-04-20 – 2018-04-21 (×2): 2 [IU] via SUBCUTANEOUS
  Administered 2018-04-25: 1 [IU] via SUBCUTANEOUS

## 2018-04-18 MED ORDER — LEVALBUTEROL HCL 0.63 MG/3ML IN NEBU
0.6300 mg | INHALATION_SOLUTION | Freq: Three times a day (TID) | RESPIRATORY_TRACT | Status: DC
  Administered 2018-04-19 – 2018-04-20 (×4): 0.63 mg via RESPIRATORY_TRACT
  Filled 2018-04-18 (×5): qty 3

## 2018-04-18 NOTE — Progress Notes (Addendum)
Pt seen in room alert/oriented in no acute distress  VSS. Peritoneal dialysis catheter flushed x2 as ordered, body fluid culture obtained as ordered. Report given to primary nurse

## 2018-04-18 NOTE — ED Notes (Signed)
Dialysis nurse called to obtain peritoneal fluid.  Per EDP hold on IV abx until peritoneal fluid obtained.

## 2018-04-18 NOTE — ED Notes (Signed)
ED TO INPATIENT HANDOFF REPORT  ED Nurse Name and Phone #: Eugene Garnet [phone]  S Name/Age/Gender Brenda Davis 53 y.o. female Room/Bed: 026C/026C  Code Status   Code Status: Full Code  Home/SNF/Other Home Patient oriented to: self, place, time and situation Is this baseline? Yes   Triage Complete: Triage complete  Chief Complaint sick  Triage Note Reports shortness of breath with fever.  Currently on peritoneal dialysis.  Given 4mg  IV zofran via EMS.     Allergies Allergies  Allergen Reactions  . Amlodipine Swelling  . Clonidine Derivatives Swelling    Limbs swell  . Doxycycline Nausea And Vomiting    "I threw up for 3 hours"  . Welchol [Colesevelam Hcl] Nausea Only    Level of Care/Admitting Diagnosis ED Disposition    ED Disposition Condition [facility] Area: [place] [[id number]]  Level of Care: Progressive [102]  Diagnosis: Peritonitis [facility]) [[id number]]  Admitting Physician: Raiford Noble LATIF [[id number]]  Attending Physician: Raiford Noble LATIF [[id number]]  Estimated length of stay: past midnight tomorrow  Certification:: I certify this patient will need inpatient services for at least 2 midnights  PT Class (Do Not Modify): Inpatient [101]  PT Acc Code (Do Not Modify): Private [1]       B Medical/Surgery History Past Medical History:  Diagnosis Date  . Anemia   . Chronic kidney disease    STAGE 3 CHRONIC KIDNEY DISEASE SECONDARY TO DIABETIC GLOMERULOSCLEROSIS AND UNCONTROLLED HYPERTENSION - PER OFFICE NOTES DR. [facility].  Marland Kitchen ESRD on dialysis [facility])    "MWF; [address] (05/06/2016)  . Gout   . HCAP (healthcare-associated pneumonia) 05/06/2016  . History of blood transfusion    "related to surgery"  . Hyperlipidemia   . Hypertension   . Morbid obesity ([place])   . Pain    LEFT SHOULDER PAIN - WAS SEEN AT AN URGENT CARE - GIVEN SLING FOR COMFORT AND TOLD ROM AS TOLERATED.  Marland Kitchen Palpitations  09/24/2016  . Type II diabetes mellitus ([place])    "gastric sleeve OR corrected this" (05/06/2016)   Past Surgical History:  Procedure Laterality Date  . AV FISTULA PLACEMENT Left 01/03/2015   Procedure: BRACHIAL CEPHALIC ARTERIOVENOUS  FISTULA CREATION LEFT ARM;  Surgeon: Angelia Mould, MD;  Location: [facility];  Service: Vascular;  Laterality: Left;  . CARPAL TUNNEL RELEASE Right   . [place]  . CHOLECYSTECTOMY  09/14/2017  . ESOPHAGOGASTRODUODENOSCOPY N/A 09/04/2012   Procedure: ESOPHAGOGASTRODUODENOSCOPY (EGD);  Surgeon: Shann Medal, MD;  Location: [facility];  Service: General;  Laterality: N/A;  PF  . ESOPHAGOGASTRODUODENOSCOPY (EGD) WITH ESOPHAGEAL DILATION N/A 09/29/2012   Procedure: ESOPHAGOGASTRODUODENOSCOPY (EGD) WITH ESOPHAGEAL DILATION;  Surgeon: Milus Banister, MD;  Location: [facility];  Service: Endoscopy;  Laterality: N/A;  . INSERTION OF DIALYSIS CATHETER N/A 01/03/2015   Procedure: INSERTION OF DIALYSIS CATHETER RIGHT INTERNAL JUGULAR;  Surgeon: Angelia Mould, MD;  Location: [place];  Service: Vascular;  Laterality: N/A;  . LAPAROSCOPIC GASTRIC SLEEVE RESECTION N/A 07/19/2012   Procedure: LAPAROSCOPIC SLEEVE GASTRECTOMY with EGD;  Surgeon: Madilyn Hook, DO;  Location: [facility];  Service: General;  Laterality: N/A;  laparoscopic sleeve gastrectomy with EGD  . LEFT HEART CATH AND CORONARY ANGIOGRAPHY N/A 02/25/2018   Procedure: LEFT HEART CATH AND CORONARY ANGIOGRAPHY;  Surgeon: Leonie Man, MD;  Location: [facility];  Service: Cardiovascular;  Laterality: N/A;  . PERIPHERAL VASCULAR CATHETERIZATION Left 05/23/2015  Procedure: Nolon Stalls;  Surgeon: Conrad Longport, MD;  Location: [facility];  Service: Cardiovascular;  Laterality: Left;  upper aRM  . PERIPHERAL VASCULAR CATHETERIZATION Left 05/23/2015   Procedure: Peripheral Vascular Balloon Angioplasty;  Surgeon: Conrad Chemung, MD;  Location: [facility];  Service:  Cardiovascular;  Laterality: Left;  av fistula  . TUBAL LIGATION  1993  . UPPER GI ENDOSCOPY N/A 07/19/2012   Procedure: UPPER GI ENDOSCOPY;  Surgeon: Madilyn Hook, DO;  [facility];  Service: General;  Laterality: N/A;     A IV Location/Drains/Wounds Patient Lines/Drains/Airways Status   Active Line/Drains/Airways    Name:   Placement date:   Placement time:   Site:   Days:   Peripheral IV 04/18/18 Right Antecubital   04/18/18    1059    Antecubital   less than 1   Peripheral IV 04/18/18 Right Forearm   04/18/18    1140    Forearm   less than 1   Fistula / Graft Left Upper arm Arteriovenous fistula   01/03/15    1309    Upper arm   1201   Fistula / Graft Left Upper arm Arteriovenous fistula   -    -    Upper arm             Intake/Output Last 24 hours  Intake/Output Summary (Last 24 hours) at 04/18/2018 1512 Last data filed at 04/18/2018 1432 Gross per 24 hour  Intake 100 ml  Output -  Net 100 ml    Labs/Imaging Results for orders placed or performed during the hospital encounter of 04/18/18 (from the past 48 hour(s))  CBG monitoring, ED     Status: Abnormal   Collection Time: 04/18/18 11:15 AM  Result Value Ref Range   Glucose-Capillary 127 (H) 70 - 99 mg/dL   Comment 1 Notify RN    Comment 2 Document in Chart   Lactic acid, plasma     Status: Abnormal   Collection Time: 04/18/18 11:43 AM  Result Value Ref Range   Lactic Acid, Venous 2.2 (HH) 0.5 - 1.9 mmol/L    Comment: CRITICAL RESULT CALLED TO, READ BACK BY AND VERIFIED WITH: R.ISON,RN 04/18/2018 1229 DAVISB Performed at [facility], [place] [address] [place], [place] [address]   Comprehensive metabolic panel     Status: Abnormal   Collection Time: 04/18/18 11:43 AM  Result Value Ref Range   Sodium 131 (L) 135 - 145 mmol/L   Potassium 2.6 (LL) 3.5 - 5.1 mmol/L    Comment: CRITICAL RESULT CALLED TO, READ BACK BY AND VERIFIED WITH: R.ISON,RN 04/18/2018 1232 DAVISB    Chloride 94 (L) 98 - 111 mmol/L   CO2  20 (L) 22 - 32 mmol/L   Glucose, Bld 136 (H) 70 - 99 mg/dL   BUN 35 (H) 6 - 20 mg/dL   Creatinine, Ser 15.69 (H) 0.44 - 1.00 mg/dL   Calcium 6.6 (L) 8.9 - 10.3 mg/dL   Total Protein 5.5 (L) 6.5 - 8.1 g/dL   Albumin 2.3 (L) 3.5 - 5.0 g/dL   AST 219 (H) 15 - 41 U/L   ALT 8 0 - 44 U/L   Alkaline Phosphatase 150 (H) 38 - 126 U/L   Total Bilirubin 0.4 0.3 - 1.2 mg/dL   GFR calc non Af Amer 2 (L) >60 mL/min   GFR calc Af Amer 3 (L) >60 mL/min   Anion gap 17 (H) 5 - 15  Comment: Performed at [facility], [place] [address] [place], [place] [address]  CBC WITH DIFFERENTIAL     Status: Abnormal   Collection Time: 04/18/18 11:43 AM  Result Value Ref Range   WBC 4.7 4.0 - 10.5 K/uL   RBC 3.76 (L) 3.87 - 5.11 MIL/uL   Hemoglobin 12.0 12.0 - 15.0 g/dL   HCT 37.1 36.0 - 46.0 %   MCV 98.7 80.0 - 100.0 fL   MCH 31.9 26.0 - 34.0 pg   MCHC 32.3 30.0 - 36.0 g/dL   RDW 14.4 11.5 - 15.5 %   Platelets 205 150 - 400 K/uL   nRBC 0.0 0.0 - 0.2 %   Neutrophils Relative % 79 %   Neutro Abs 3.7 1.7 - 7.7 K/uL   Lymphocytes Relative 9 %   Lymphs Abs 0.4 (L) 0.7 - 4.0 K/uL   Monocytes Relative 6 %   Monocytes Absolute 0.3 0.1 - 1.0 K/uL   Eosinophils Relative 3 %   Eosinophils Absolute 0.2 0.0 - 0.5 K/uL   Basophils Relative 0 %   Basophils Absolute 0.0 0.0 - 0.1 K/uL   WBC Morphology INCREASED BANDS (>20% BANDS)     Comment: VACUOLATED NEUTROPHILS   Immature Granulocytes 3 %   Abs Immature Granulocytes 0.13 (H) 0.00 - 0.07 K/uL    Comment: Performed at [facility], [address] [place], [place] [address]  Lipase, blood     Status: None   Collection Time: 04/18/18 11:43 AM  Result Value Ref Range   Lipase 26 11 - 51 U/L    Comment: Performed at [facility] [address], [place], [place] [address]  Lactate dehydrogenase (pleural or peritoneal fluid)     Status: None   Collection Time: 04/18/18  1:31 PM  Result Value Ref Range   LD, Fluid 11 3 - 23 U/L    Comment:  (NOTE) Results should be evaluated in conjunction with serum values    Fluid Type-FLDH PERITONEAL CAVITY     Comment: Performed at [facility] [address], [place], [place] [address]  Glucose, pleural or peritoneal fluid     Status: None   Collection Time: 04/18/18  1:31 PM  Result Value Ref Range   Glucose, Fluid 526 mg/dL    Comment: (NOTE) No normal range established for this test Results should be evaluated in conjunction with serum values    Fluid Type-FGLU PERITONEAL CAVITY     Comment: Performed at [facility] [address], [place], [place] [address]  Protein, pleural or peritoneal fluid     Status: None   Collection Time: 04/18/18  1:31 PM  Result Value Ref Range   Total protein, fluid <3.0 g/dL    Comment: RESULT REPEATED AND VERIFIED   Fluid Type-FTP PERITONEAL CAVITY     Comment: Performed at [facility] [address] [place], [place] [address]  Albumin, pleural or peritoneal fluid     Status: None   Collection Time: 04/18/18  1:31 PM  Result Value Ref Range   Albumin, Fluid <1.0 g/dL    Comment: RESULT REPEATED AND VERIFIED   Fluid Type-FALB PERITONEAL CAVITY     Comment: Performed at [facility] [address] [place], [place] [address]  Gram stain     Status: None   Collection Time: 04/18/18  1:47 PM  Result Value Ref Range   Specimen Description PERITONEAL CAVITY    Special  Requests NONE    Gram Stain      WBC PRESENT,BOTH PMN AND MONONUCLEAR NO ORGANISMS SEEN CYTOSPIN SMEAR Performed at [facility], [place] [address], [place], [place] [address]    Report Status 04/18/2018 FINAL   Magnesium     Status: Abnormal   Collection Time: 04/18/18  2:35 PM  Result Value Ref Range   Magnesium 1.5 (L) 1.7 - 2.4 mg/dL    Comment: Performed at [facility] [address], [place], [place] [address]  Phosphorus     Status: None   Collection Time: 04/18/18  2:35 PM  Result Value Ref Range   Phosphorus 4.5 2.5 - 4.6  mg/dL    Comment: Performed at [facility], [place] [address] [place], [place] [address]   Dg Chest Port 1 View  Result Date: 04/18/2018 CLINICAL DATA:  Fever EXAM: PORTABLE CHEST 1 VIEW COMPARISON:  03/16/2018 FINDINGS: The heart size and mediastinal contours are within normal limits. Both lungs are clear. The visualized skeletal structures are unremarkable. IMPRESSION: No active disease. Electronically Signed   By: Kathreen Devoid   On: 04/18/2018 11:31    Pending Labs Unresulted Labs (From admission, onward)    Start     Ordered   04/19/18 0500  Comprehensive metabolic panel  Tomorrow morning,   R     04/18/18 1431   04/19/18 0500  CBC WITH DIFFERENTIAL  Tomorrow morning,   R     04/18/18 1431   04/19/18 0500  Procalcitonin  Daily,   R     04/18/18 1434   04/18/18 1437  MRSA PCR Screening  Once,   R     04/18/18 1436   04/18/18 1435  Procalcitonin - Baseline  ONCE - STAT,   STAT     04/18/18 1434   04/18/18 1435  Lactic acid, plasma  STAT Now then every 3 hours,   R     04/18/18 1434   04/18/18 1428  Urine culture  Once,   R     04/18/18 1431   04/18/18 1428  TSH  Add-on,   R     04/18/18 1431   04/18/18 1347  Culture, body fluid-bottle  Once,   R     04/18/18 1347   04/18/18 1110  Lactic acid, plasma  Now then every 2 hours,   STAT     04/18/18 1119   04/18/18 1110  Blood Culture (routine x 2)  BLOOD CULTURE X 2,   STAT    Question:  Patient immune status  Answer:  Normal   04/18/18 1119   04/18/18 1110  Urinalysis, Routine w reflex microscopic  ONCE - STAT,   STAT     04/18/18 1119          Vitals/Pain Today's Vitals   04/18/18 1406 04/18/18 1415 04/18/18 1430 04/18/18 1500  BP: (!) 83/63 94/74 93/64  (!) 98/59  Pulse: 94 97 88 87  Resp: (!) 32 (!) 35 (!) 30 (!) 24  Temp:      TempSrc:      SpO2: 99% 97% 97% 99%  Weight:      Height:      PainSc:        Isolation Precautions No active isolations  Medications Medications  ceFEPIme (MAXIPIME) 2 g in  sodium chloride 0.9 % 100 mL IVPB (has no administration in time range)  metroNIDAZOLE (FLAGYL) IVPB 500 mg (has no administration in time range)  vancomycin (VANCOCIN)  2,500 mg in sodium chloride 0.9 % 500 mL IVPB (2,500 mg Intravenous New Bag/Given 04/18/18 1327)  ceFEPIme (MAXIPIME) 1 g in sodium chloride 0.9 % 100 mL IVPB (has no administration in time range)  potassium chloride 10 mEq in 100 mL IVPB (10 mEq Intravenous New Bag/Given 04/18/18 1435)  0.9 %  sodium chloride infusion (500 mLs Intravenous New Bag/Given 04/18/18 1326)  acetaminophen (TYLENOL) tablet 650 mg (has no administration in time range)  aspirin EC tablet 81 mg (has no administration in time range)  oxyCODONE (Oxy IR/ROXICODONE) immediate release tablet 5 mg (has no administration in time range)  cinacalcet (SENSIPAR) tablet 60 mg (has no administration in time range)  ferric citrate (AURYXIA) tablet 210 mg (has no administration in time range)  calcium acetate (PHOSLO) capsule 1,334 mg (has no administration in time range)  folic acid (FOLVITE) tablet 1 mg (has no administration in time range)  heparin injection 5,000 Units (has no administration in time range)  sodium chloride 0.9 % bolus 500 mL (has no administration in time range)  ondansetron (ZOFRAN) tablet 4 mg (has no administration in time range)    Or  ondansetron (ZOFRAN) injection 4 mg (has no administration in time range)  ipratropium (ATROVENT) nebulizer solution 0.5 mg (has no administration in time range)  albuterol (PROVENTIL) (2.5 MG/3ML) 0.083% nebulizer solution 2.5 mg (has no administration in time range)  levalbuterol (XOPENEX) nebulizer solution 0.63 mg (has no administration in time range)  guaiFENesin (MUCINEX) 12 hr tablet 600 mg (has no administration in time range)  camphor-menthol (SARNA) lotion (has no administration in time range)  acetaminophen (TYLENOL) tablet 650 mg (650 mg Oral Given 04/18/18 1209)  sodium chloride 0.9 % bolus 500 mL (500  mLs Intravenous Bolus 04/18/18 1330)    Mobility walks Low fall risk   Focused Assessments Renal Assessment Handoff:  Hemodialysis Schedule: Hemodialysis Schedule: Monday/Wednesday/Friday Patient on peritoneal dialysis. Last Hemodialysis date and time: N/A   Restricted appendage: left arm     R Recommendations: See Admitting Provider Note  Report given to: Fabio Pierce, RN  Additional Notes:

## 2018-04-18 NOTE — ED Notes (Signed)
Patient transported to CT 

## 2018-04-18 NOTE — Consult Note (Addendum)
[facility] Renal Consultation Note    Indication for Consultation:  Management of ESRD/hemodialysis; anemia, hypertension/volume and secondary hyperparathyroidism PCP:  HPI: Brenda Davis is a 53 y.o. female with ESRD on CCPD with hx of DM, HTN, s/p gastric sleeve (2014)  who presented to the ED with several day history of fever having recently completed treatment for staph warneri perontinits with last Ancef given IP 3/25.  The patient's husband called HT this am reporting that she had a fever to 101 with vomiting and stomach pain over the weekend.  She did do most of her dialysis and finished up this am. Temperature is now up to 102.3 in the ED intial BP low. BC and cell count/culures done and started empierically on Vanc, Maxipime and flagyl.  K is low at 2.6 IV K ordered. WBC 4.6 with normal diff but increased bands, Lactic acid 2.2 CXR NAD.  She is admitted with sepsis - most likely peritonitis, but rule out other source. Cell count in the ED was found to be 526.  When seen in the ED she is mostly complaining of itching all over and wants her husband to bring her back scratcher.  She has some mild SOB but has good O2 sats on room air with clear CXR.  She has not yet been weighed though admission wt is listed as 108.9 kg.  She has generalized abdominal pain with history of prior cholecystectomy, hx of steatosis with cirrhosis. She had a CT scan 3/21 for N, V, abdominal pain without acute findings.  Per virtual GI consult for GI sx and early satiety who thought sx could still be perontinitis realted and thought it reasonable to check H pylori via stool.    Past Medical History:  Diagnosis Date  . Anemia   . Chronic kidney disease    STAGE 3 CHRONIC KIDNEY DISEASE SECONDARY TO DIABETIC GLOMERULOSCLEROSIS AND UNCONTROLLED HYPERTENSION - PER OFFICE NOTES DR. Florene Glen [facility].  Marland Kitchen ESRD on dialysis [facility])    "MWF; [address] (05/06/2016)  . Gout   . HCAP  (healthcare-associated pneumonia) 05/06/2016  . History of blood transfusion    "related to surgery"  . Hyperlipidemia   . Hypertension   . Morbid obesity ([place])   . Pain    LEFT SHOULDER PAIN - WAS SEEN AT AN URGENT CARE - GIVEN SLING FOR COMFORT AND TOLD ROM AS TOLERATED.  Marland Kitchen Palpitations 09/24/2016  . Type II diabetes mellitus ([place])    "gastric sleeve OR corrected this" (05/06/2016)   Past Surgical History:  Procedure Laterality Date  . AV FISTULA PLACEMENT Left 01/03/2015   Procedure: BRACHIAL CEPHALIC ARTERIOVENOUS  FISTULA CREATION LEFT ARM;  Surgeon: Angelia Mould, MD;  Location: [place];  Service: Vascular;  Laterality: Left;  . CARPAL TUNNEL RELEASE Right   . [place]  . CHOLECYSTECTOMY  09/14/2017  . ESOPHAGOGASTRODUODENOSCOPY N/A 09/04/2012   Procedure: ESOPHAGOGASTRODUODENOSCOPY (EGD);  Surgeon: Shann Medal, MD;  Location: [facility];  Service: General;  Laterality: N/A;  PF  . ESOPHAGOGASTRODUODENOSCOPY (EGD) WITH ESOPHAGEAL DILATION N/A 09/29/2012   Procedure: ESOPHAGOGASTRODUODENOSCOPY (EGD) WITH ESOPHAGEAL DILATION;  Surgeon: Milus Banister, MD;  Location: [facility];  Service: Endoscopy;  Laterality: N/A;  . INSERTION OF DIALYSIS CATHETER N/A 01/03/2015   Procedure: INSERTION OF DIALYSIS CATHETER RIGHT INTERNAL JUGULAR;  Surgeon: Angelia Mould, MD;  Location: [place];  Service: Vascular;  Laterality: N/A;  . LAPAROSCOPIC GASTRIC SLEEVE RESECTION N/A 07/19/2012   Procedure:  LAPAROSCOPIC SLEEVE GASTRECTOMY with EGD;  Surgeon: Madilyn Hook, DO;  Location: [facility];  Service: General;  Laterality: N/A;  laparoscopic sleeve gastrectomy with EGD  . LEFT HEART CATH AND CORONARY ANGIOGRAPHY N/A 02/25/2018   Procedure: LEFT HEART CATH AND CORONARY ANGIOGRAPHY;  Surgeon: Leonie Man, MD;  Location: [place] [facility];  Service: Cardiovascular;  Laterality: N/A;  . PERIPHERAL VASCULAR CATHETERIZATION Left 05/23/2015   Procedure: Nolon Stalls;   Surgeon: Conrad Florence, MD;  Location: [place] [facility];  Service: Cardiovascular;  Laterality: Left;  upper aRM  . PERIPHERAL VASCULAR CATHETERIZATION Left 05/23/2015   Procedure: Peripheral Vascular Balloon Angioplasty;  Surgeon: Conrad Brule, MD;  Location: [place] [facility];  Service: Cardiovascular;  Laterality: Left;  av fistula  . TUBAL LIGATION  1993  . UPPER GI ENDOSCOPY N/A 07/19/2012   Procedure: UPPER GI ENDOSCOPY;  Surgeon: Madilyn Hook, DO;  Location: [facility];  Service: General;  Laterality: N/A;   Family History  Problem Relation Age of Onset  . Hypertension Mother   . Mitral valve prolapse Mother   . Diabetes Father   . Arthritis Other   . Hyperlipidemia Other   . Cancer Neg Hx   . Heart disease Neg Hx   . Kidney disease Neg Hx   . Stroke Neg Hx    Social History:  reports that she quit smoking about 11 years ago. Her smoking use included cigarettes. She has a 16.00 pack-year smoking history. She has never used smokeless tobacco. She reports that she does not drink alcohol or use drugs. Allergies  Allergen Reactions  . Amlodipine Swelling  . Clonidine Derivatives Swelling    Limbs swell  . Doxycycline Nausea And Vomiting    "I threw up for 3 hours"  . Welchol [Colesevelam Hcl] Nausea Only   Prior to Admission medications   Medication Sig Start Date End Date Taking? Authorizing Provider  acetaminophen (TYLENOL) 325 MG tablet Take 650 mg by mouth every 6 (six) hours as needed for mild pain.    [provider]  aspirin EC 81 MG EC tablet Take 1 tablet (81 mg total) by mouth daily. 02/26/18   Norval Morton, MD  atorvastatin (LIPITOR) 80 MG tablet Take 1 tablet (80 mg total) by mouth daily at 6 PM. 04/12/18   [name], MD  AURYXIA 1 GM 210 MG(Fe) tablet Take 210 mg by mouth 3 (three) times daily with meals.  04/07/17   [provider]  calcium acetate (PHOSLO) 667 MG capsule Take 2 capsules (1,334 mg total) by mouth 3 (three) times daily with meals.  01/07/15   Eugenie Filler, MD  carvedilol (COREG) 3.125 MG tablet Take 1 tablet (3.125 mg total) by mouth 2 (two) times daily with a meal. 02/26/18   Norval Morton, MD  cinacalcet (SENSIPAR) 30 MG tablet Take 2 tablets (60 mg total) by mouth daily with supper. 02/26/18   Norval Morton, MD  Cyanocobalamin (B-12 PO) Take 1 tablet by mouth daily.    [provider]  folic acid (FOLVITE) 1 MG tablet Take 1 tablet (1 mg total) by mouth daily. 03/08/18   [name], MD  gentamicin cream (GARAMYCIN) 0.1 % Apply 1 application topically See admin instructions. Apply to exit site daily as directed. 04/26/17   [provider]  ondansetron (ZOFRAN ODT) 8 MG disintegrating tablet Take 1 tablet (8 mg total) by mouth every 8 (eight) hours as needed for nausea or vomiting. 04/09/18  Jola Schmidt, MD  oxyCODONE (OXY IR/ROXICODONE) 5 MG immediate release tablet Take 1 tablet (5 mg total) by mouth every 6 (six) hours as needed for severe pain. 04/12/18   [name], MD   Current Facility-Administered Medications  Medication Dose Route Frequency Provider Last Rate Last Dose  . 0.9 %  sodium chloride infusion   Intravenous PRN Carmin Muskrat, MD 10 mL/hr at 04/18/18 1326 500 mL at 04/18/18 1326  . [START ON 04/19/2018] ceFEPIme (MAXIPIME) 1 g in sodium chloride 0.9 % 100 mL IVPB  1 g Intravenous Q24H Rumbarger, Rachel L, RPH      . ceFEPIme (MAXIPIME) 2 g in sodium chloride 0.9 % 100 mL IVPB  2 g Intravenous Once Petrucelli, Samantha R, PA-C      . metroNIDAZOLE (FLAGYL) IVPB 500 mg  500 mg Intravenous Once Petrucelli, Samantha R, PA-C      . potassium chloride 10 mEq in 100 mL IVPB  10 mEq Intravenous Q1 Hr x 3 Petrucelli, Samantha R, PA-C 100 mL/hr at 04/18/18 1328 10 mEq at 04/18/18 1328  . vancomycin (VANCOCIN) 2,500 mg in sodium chloride 0.9 % 500 mL IVPB  2,500 mg Intravenous Once Rumbarger, Valeda Malm, RPH 250 mL/hr at 04/18/18 1327 2,500 mg at 04/18/18 1327   Current Outpatient  Medications  Medication Sig Dispense Refill  . acetaminophen (TYLENOL) 325 MG tablet Take 650 mg by mouth every 6 (six) hours as needed for mild pain.    Marland Kitchen aspirin EC 81 MG EC tablet Take 1 tablet (81 mg total) by mouth daily. 30 tablet 0  . atorvastatin (LIPITOR) 80 MG tablet Take 1 tablet (80 mg total) by mouth daily at 6 PM. 90 tablet 1  . AURYXIA 1 GM 210 MG(Fe) tablet Take 210 mg by mouth 3 (three) times daily with meals.   3  . calcium acetate (PHOSLO) 667 MG capsule Take 2 capsules (1,334 mg total) by mouth 3 (three) times daily with meals. 180 capsule 0  . carvedilol (COREG) 3.125 MG tablet Take 1 tablet (3.125 mg total) by mouth 2 (two) times daily with a meal. 60 tablet 0  . cinacalcet (SENSIPAR) 30 MG tablet Take 2 tablets (60 mg total) by mouth daily with supper. 30 tablet 0  . Cyanocobalamin (B-12 PO) Take 1 tablet by mouth daily.    . folic acid (FOLVITE) 1 MG tablet Take 1 tablet (1 mg total) by mouth daily. 90 tablet 1  . gentamicin cream (GARAMYCIN) 0.1 % Apply 1 application topically See admin instructions. Apply to exit site daily as directed.  4  . ondansetron (ZOFRAN ODT) 8 MG disintegrating tablet Take 1 tablet (8 mg total) by mouth every 8 (eight) hours as needed for nausea or vomiting. 10 tablet 0  . oxyCODONE (OXY IR/ROXICODONE) 5 MG immediate release tablet Take 1 tablet (5 mg total) by mouth every 6 (six) hours as needed for severe pain. 45 tablet 0   Labs: Basic Metabolic Panel: Recent Labs  Lab 04/18/18 1143  NA 131*  K 2.6*  CL 94*  CO2 20*  GLUCOSE 136*  BUN 35*  CREATININE 15.69*  CALCIUM 6.6*   Liver Function Tests: Recent Labs  Lab 04/18/18 1143  AST 219*  ALT 8  ALKPHOS 150*  BILITOT 0.4  PROT 5.5*  ALBUMIN 2.3*   Recent Labs  Lab 04/18/18 1143  LIPASE 26   No results for input(s): AMMONIA in the last 168 hours. CBC: Recent Labs  Lab 04/18/18 1143  WBC  4.7  NEUTROABS 3.7  HGB 12.0  HCT 37.1  MCV 98.7  PLT 205   Cardiac  Enzymes: No results for input(s): CKTOTAL, CKMB, CKMBINDEX, TROPONINI in the last 168 hours. CBG: Recent Labs  Lab 04/18/18 1115  GLUCAP 127*  Studies/Results: Dg Chest Port 1 View  Result Date: 04/18/2018 CLINICAL DATA:  Fever EXAM: PORTABLE CHEST 1 VIEW COMPARISON:  03/16/2018 FINDINGS: The heart size and mediastinal contours are within normal limits. Both lungs are clear. The visualized skeletal structures are unremarkable. IMPRESSION: No active disease. Electronically Signed   By: Kathreen Devoid   On: 04/18/2018 11:31    ROS: As per HPI otherwise negative.  Physical Exam: Vitals:   04/18/18 1327 04/18/18 1335 04/18/18 1345 04/18/18 1400  BP:  94/70 (!) 83/69 (!) 85/53  Pulse:  91 (!) 104 91  Resp:  (!) 21 (!) 22 (!) 25  Temp: (!) 101.3 F (38.5 C)     TempSrc: Oral     SpO2:  96% 98% 96%  Weight:      Height:         General: obese female irritable Head: NCAT sclera not icteric MMM Neck: Supple.  Lungs: CTA bilaterally without wheezes, rales, or rhonchi. Breathing is unlabored. Heart: RRR with S1 S2.  Abdomen: soft  Generalized tenderness, no rebound or guarding + BS Skin: dry lax, excoriation where she can reach to scratch Lower extremities:without edema or ischemic changes, no open wounds  Neuro: A & O  X 3. Moves all extremities spontaneously. Psych:  Responds to questions appropriately with a normal affect. Dialysis Access: PD cath exit site no drainage  Dialysis Orders: CCPD edw 116.5 6 exchnage 2.5 L dwell 1.5 hr dry day  Recent hgb 12 ipTH 866 Calcitriol 0.5/day, sensipar 60 qod, Aurixya 2 ac phoslo 3 ac  Assessment/Plan: 1. Fever - hx recent staph warneri peritonitis tx Ancef IP - cell count drawn in ED - WBC elevated 526 - plan two rapid exchanges today and then hold on CCPD due to low BP and no volume excess 2. hypokalemia - replete K-  3. Elevated LFTs- AST 216 (was 24 03/23/18 at HD unit) other LFT ok 4. ESRD -  CCPD- continue home orders - hold tonight -  reassess in am -  5. hypotension/volume  - no need for CCPD tonight - for admission to step down per primary 6. Anemia  - hgb 12 - no ESA/Fe 7. Metabolic bone disease -  Resume  home meds - may have to hold if persistent vomiting  8. Nutrition - alb 2.3 - renal carb mod   Myriam Jacobson, PA-C [place] [phone] 04/18/2018, 2:12 PM   Pt seen, examined and agree w A/P as above. Recent peritonitis, fluid cleared w/ abx, recently completed. Now presenting w/ diffuse abd pain again and fevers. Most likely will have peritonitis again. Agree w/ empiric IV abx.  Will follow.  [facility] 04/18/2018, 5:29 PM

## 2018-04-18 NOTE — ED Notes (Signed)
Called to room where pt c/o feeling hot and like she will pass out. Vs rechecked. Pt given wet swab for dry mouth. Hob decreased. Samanta P. PA informedof same and request I give ice chips to patient. Same done.

## 2018-04-18 NOTE — H&P (Signed)
History and Physical    Brenda Davis:[id number] DOB: Apr 03, 1965 DOA: 04/18/2018  PCP: [name], MD   Patient coming from: Home via [place]  Chief Complaint: Abdominal Pain, Fever  HPI: Brenda Davis is a 53 y.o. female with medical history significant of of end-stage renal disease secondary to diabetic glomerulosclerosis and uncontrolled hypertension previously on hemodialysis Monday Wednesday Friday on peritoneal dialysis, history of gout, history of hypertension, hyperlipidemia, history of morbid obesity, type 2 diabetes mellitus, anemia of chronic kidney disease, as well as other comorbidities who presents with a chief complaint of fever and abdominal pain that started on Saturday.  Note she has been having issues with Staphylococcus Warneri tinnitus in the last few weeks and has had a 14-day course of antibiotics that was recently completed with clearance of fluid per dialysis.  Since Saturday she started having developing intermittent severe abdominal pain that was a dull pain that she rated at a 10 out of 10 in severity along with low-grade associated temperatures and generalized fatigue.   Patient normally dialyzes at home with peritoneal dialysis and then today Husband called her HD center who sent her to the ED given that she had a fever of 100.5 and she was found to be hypotensive.  She presented to the emergency room with these above symptoms and also complained of intermittent chronic shortness of breath on exertion.  In the ED she is found to have a mildly elevated lactic acid level as well as being hypotension she is given a 500 mL bolus to start embolus Bactrim antibiotics.  Suspected that she had another peritoneal infection so peritoneal fluid was obtained prior to starting antibiotics nephrology is consulted for further evaluation recommendations.  Patient denies any chest pain, lightheadedness dizziness at this time.  Of note also Recently  she saw Gastroenterology via a telemedicine consult who felt that patient's early satiety is related to peritonitis and was reasonable to check a H. pylori via stool. [facility] hospital[facility] was asked to admit this patient for sepsis secondary to suspected peritonitis.    ED Course: In the ED, blood work was done including CBC, CMP, lactic acid level as well as blood cultures, peritoneal culture and a chest x-ray.  She was given 500 mL bolus and started on broad-spectrum antibiotics with IV vancomycin, IV cefepime and IV Flagyl.  Review of Systems: As per HPI otherwise 10 point review of systems negative.   Past Medical History:  Diagnosis Date   Anemia    Chronic kidney disease    STAGE 3 CHRONIC KIDNEY DISEASE SECONDARY TO DIABETIC GLOMERULOSCLEROSIS AND UNCONTROLLED HYPERTENSION - PER OFFICE NOTES DR. [facility].   ESRD on dialysis [facility])    "MWF; [address] (05/06/2016)   Gout    HCAP (healthcare-associated pneumonia) 05/06/2016   History of blood transfusion    "related to surgery"   Hyperlipidemia    Hypertension    Morbid obesity ([place])    Pain    LEFT SHOULDER PAIN - WAS SEEN AT AN URGENT CARE - GIVEN SLING FOR COMFORT AND TOLD ROM AS TOLERATED.   Palpitations 09/24/2016   Type II diabetes mellitus ([place])    "gastric sleeve OR corrected this" (05/06/2016)   Past Surgical History:  Procedure Laterality Date   AV FISTULA PLACEMENT Left 01/03/2015   Procedure: BRACHIAL CEPHALIC ARTERIOVENOUS  FISTULA CREATION LEFT ARM;  Surgeon: Angelia Mould, MD;  Location: [facility];  Service: Vascular;  Laterality: Left;   CARPAL TUNNEL  RELEASE Right    [place]   CHOLECYSTECTOMY  09/14/2017   ESOPHAGOGASTRODUODENOSCOPY N/A 09/04/2012   Procedure: ESOPHAGOGASTRODUODENOSCOPY (EGD);  Surgeon: Shann Medal, MD;  Location: [facility];  Service: General;  Laterality: N/A;  PF   ESOPHAGOGASTRODUODENOSCOPY (EGD) WITH ESOPHAGEAL DILATION N/A  09/29/2012   Procedure: ESOPHAGOGASTRODUODENOSCOPY (EGD) WITH ESOPHAGEAL DILATION;  Surgeon: Milus Banister, MD;  Location: [facility];  Service: Endoscopy;  Laterality: N/A;   INSERTION OF DIALYSIS CATHETER N/A 01/03/2015   Procedure: INSERTION OF DIALYSIS CATHETER RIGHT INTERNAL JUGULAR;  Surgeon: Angelia Mould, MD;  Location: [facility] OR;  Service: Vascular;  Laterality: N/A;   LAPAROSCOPIC GASTRIC SLEEVE RESECTION N/A 07/19/2012   Procedure: LAPAROSCOPIC SLEEVE GASTRECTOMY with EGD;  Surgeon: Madilyn Hook, DO;  Location: [facility];  Service: General;  Laterality: N/A;  laparoscopic sleeve gastrectomy with EGD   LEFT HEART CATH AND CORONARY ANGIOGRAPHY N/A 02/25/2018   Procedure: LEFT HEART CATH AND CORONARY ANGIOGRAPHY;  Surgeon: Leonie Man, MD;  Location: [facility];  Service: Cardiovascular;  Laterality: N/A;   PERIPHERAL VASCULAR CATHETERIZATION Left 05/23/2015   Procedure: Nolon Stalls;  Surgeon: Conrad [place], MD;  Location: [place] [facility];  Service: Cardiovascular;  Laterality: Left;  upper aRM   PERIPHERAL VASCULAR CATHETERIZATION Left 05/23/2015   Procedure: Peripheral Vascular Balloon Angioplasty;  Surgeon: Conrad [place], MD;  Location: [facility];  Service: Cardiovascular;  Laterality: Left;  av fistula   TUBAL LIGATION  1993   UPPER GI ENDOSCOPY N/A 07/19/2012   Procedure: UPPER GI ENDOSCOPY;  Surgeon: Madilyn Hook, DO;  Location: [facility];  Service: General;  Laterality: N/A;   SOCIAL HISTORY  reports that she quit smoking about 11 years ago. Her smoking use included cigarettes. She has a 16.00 pack-year smoking history. She has never used smokeless tobacco. She reports that she does not drink alcohol or use drugs.  Allergies  Allergen Reactions   Amlodipine Swelling   Clonidine Derivatives Swelling    Limbs swell   Doxycycline Nausea And Vomiting    "I threw up for 3 hours"   Welchol [Colesevelam Hcl] Nausea Only    Family History  Problem  Relation Age of Onset   Hypertension Mother    Mitral valve prolapse Mother    Diabetes Father    Arthritis Other    Hyperlipidemia Other    Cancer Neg Hx    Heart disease Neg Hx    Kidney disease Neg Hx    Stroke Neg Hx    Prior to Admission medications   Medication Sig Start Date End Date Taking? Authorizing Provider  acetaminophen (TYLENOL) 325 MG tablet Take 650 mg by mouth every 6 (six) hours as needed for mild pain.    [provider]  aspirin EC 81 MG EC tablet Take 1 tablet (81 mg total) by mouth daily. 02/26/18   Norval Morton, MD  atorvastatin (LIPITOR) 80 MG tablet Take 1 tablet (80 mg total) by mouth daily at 6 PM. 04/12/18   [name], MD  AURYXIA 1 GM 210 MG(Fe) tablet Take 210 mg by mouth 3 (three) times daily with meals.  04/07/17   [provider]  calcium acetate (PHOSLO) 667 MG capsule Take 2 capsules (1,334 mg total) by mouth 3 (three) times daily with meals. 01/07/15   Eugenie Filler, MD  carvedilol (COREG) 3.125 MG tablet Take 1 tablet (3.125 mg total) by mouth 2 (two) times daily with a meal.  02/26/18   Norval Morton, MD  cinacalcet (SENSIPAR) 30 MG tablet Take 2 tablets (60 mg total) by mouth daily with supper. 02/26/18   Norval Morton, MD  Cyanocobalamin (B-12 PO) Take 1 tablet by mouth daily.    [provider]  folic acid (FOLVITE) 1 MG tablet Take 1 tablet (1 mg total) by mouth daily. 03/08/18   [name], MD  gentamicin cream (GARAMYCIN) 0.1 % Apply 1 application topically See admin instructions. Apply to exit site daily as directed. 04/26/17   [provider]  ondansetron (ZOFRAN ODT) 8 MG disintegrating tablet Take 1 tablet (8 mg total) by mouth every 8 (eight) hours as needed for nausea or vomiting. 04/09/18   Jola Schmidt, MD  oxyCODONE (OXY IR/ROXICODONE) 5 MG immediate release tablet Take 1 tablet (5 mg total) by mouth every 6 (six) hours as needed for severe pain. 04/12/18   [name], MD    Physical Exam: Vitals:   04/18/18 1430 04/18/18 1500 04/18/18 1551 04/18/18 1614  BP: 93/64 (!) 98/59 (!) 78/44 98/67  Pulse: 88 87 84   Resp: (!) 30 (!) 24 17   Temp:   99.7 F (37.6 C)   TempSrc:   Oral   SpO2: 97% 99% 97%   Weight:      Height:       Constitutional: WN/WD obese AAF who is in some distress and appears anxious and uncomfortable Eyes: Lids and conjunctivae normal, sclerae anicteric  ENMT: External Ears, Nose appear normal. Grossly normal hearing. Mucous membranes are dry slightly Neck: Appears normal, supple, no cervical masses, normal ROM, no appreciable thyromegaly; no JVD Respiratory: Diminished to auscultation bilaterally, no wheezing, rales, rhonchi or crackles. Slightly increased respiratory effort and mildly tachypenic. No accessory muscle use. Not wearing supplemental O2 via [place] Cardiovascular: Tachycardic Rate, no murmurs / rubs / gallops. S1 and S2 auscultated. 1+ extremity edema. 2+ pedal pulses. No carotid bruits.  Abdomen: Soft, non-tender, Distended due to body habitus and has a large pannus. No masses palpated. No appreciable hepatosplenomegaly. Bowel sounds positive x4.  GU: Deferred. Musculoskeletal: No clubbing / cyanosis of digits/nails. No joint deformity upper and lower extremities. Has an AVF in Left Arm Skin: No rashes, lesions, ulcers on a limited skin evaluation. No induration; Warm and dry.  Neurologic: CN 2-12 grossly intact with no focal deficits.  Romberg sign and cerebellar reflexes not assessed.  Psychiatric: Normal judgment and insight. Alert and oriented x 3. Anxious mood and appropriate affect.   Labs on Admission: I have personally reviewed following labs and imaging studies  CBC: Recent Labs  Lab 04/18/18 1143  WBC 4.7  NEUTROABS 3.7  HGB 12.0  HCT 37.1  MCV 98.7  PLT 546   Basic Metabolic Panel: Recent Labs  Lab 04/18/18 1143 04/18/18 1435  NA 131*  --   K 2.6*  --   CL 94*  --   CO2 20*  --   GLUCOSE 136*  --    BUN 35*  --   CREATININE 15.69*  --   CALCIUM 6.6*  --   MG  --  1.5*  PHOS  --  4.5   GFR: Estimated Creatinine Clearance: 5.1 mL/min (A) (by C-G formula based on SCr of 15.69 mg/dL (H)). Liver Function Tests: Recent Labs  Lab 04/18/18 1143  AST 219*  ALT 8  ALKPHOS 150*  BILITOT 0.4  PROT 5.5*  ALBUMIN 2.3*   Recent Labs  Lab 04/18/18 1143  LIPASE  26   No results for input(s): AMMONIA in the last 168 hours. Coagulation Profile: No results for input(s): INR, PROTIME in the last 168 hours. Cardiac Enzymes: No results for input(s): CKTOTAL, CKMB, CKMBINDEX, TROPONINI in the last 168 hours. BNP (last 3 results) No results for input(s): PROBNP in the last 8760 hours. HbA1C: No results for input(s): HGBA1C in the last 72 hours. CBG: Recent Labs  Lab 04/18/18 1115  GLUCAP 127*   Lipid Profile: No results for input(s): CHOL, HDL, LDLCALC, TRIG, CHOLHDL, LDLDIRECT in the last 72 hours. Thyroid Function Tests: Recent Labs    04/18/18 1507  TSH 3.798   Anemia Panel: No results for input(s): VITAMINB12, FOLATE, FERRITIN, TIBC, IRON, RETICCTPCT in the last 72 hours. Urine analysis:    Component Value Date/Time   COLORURINE YELLOW 10/28/2017 0839   APPEARANCEUR Cloudy (A) 10/28/2017 0839   LABSPEC 1.020 10/28/2017 0839   PHURINE 6.0 10/28/2017 0839   GLUCOSEU >=1000 (A) 10/28/2017 0839   HGBUR MODERATE (A) 10/28/2017 0839   HGBUR negative 09/17/2009 0000   BILIRUBINUR NEGATIVE 10/28/2017 0839   KETONESUR NEGATIVE 10/28/2017 0839   PROTEINUR 100 (A) 09/03/2017 1959   UROBILINOGEN 0.2 10/28/2017 0839   NITRITE NEGATIVE 10/28/2017 0839   LEUKOCYTESUR MODERATE (A) 10/28/2017 0839   Sepsis Labs: !!!!!!!!!!!!!!!!!!!!!!!!!!!!!!!!!!!!!!!!!!!! @LABRCNTIP (procalcitonin:4,lacticidven:4) ) Recent Results (from the past 240 hour(s))  Gram stain     Status: None   Collection Time: 04/18/18  1:47 PM  Result Value Ref Range Status   Specimen Description PERITONEAL  CAVITY  Final   Special Requests NONE  Final   Gram Stain   Final    WBC PRESENT,BOTH PMN AND MONONUCLEAR NO ORGANISMS SEEN CYTOSPIN SMEAR Performed at [facility], [address], [place], [place] [address]    Report Status 04/18/2018 FINAL  Final    Radiological Exams on Admission: Ct Abdomen Pelvis Wo Contrast  Result Date: 04/18/2018 CLINICAL DATA:  Fever a few days as recently completed treatment for staph peritonitis with last dose of Ancef given 04/13/2018. Fever this morning with vomiting and abdominal pain over the weekend. Patient with end-stage renal disease post dialysis this morning. EXAM: CT ABDOMEN AND PELVIS WITHOUT CONTRAST TECHNIQUE: Multidetector CT imaging of the abdomen and pelvis was performed following the standard protocol without IV contrast. COMPARISON:  04/09/2018, 03/27/2018 and 11/11/2017 as well as 02/05/2015 FINDINGS: Lower chest: Minimal linear atelectasis/scarring left base. Small sliding hiatal hernia. Hepatobiliary: Previous cholecystectomy. Liver and biliary tree are within normal. Pancreas: Normal. Spleen: Normal. Adrenals/Urinary Tract: Adrenal glands are normal. Kidneys are somewhat small without hydronephrosis or nephrolithiasis. Ureters and bladder are normal. Stomach/Bowel: Surgical sutures over the stomach compatible previous bypass procedure. Small bowel is normal. Appendix is normal. Mild diverticulosis over the descending colon and sigmoid colon. Vascular/Lymphatic: Minimal calcified plaque over the abdominal aorta. No evidence of adenopathy. Reproductive: Normal. Other: Peritoneal dialysis catheter enters the lower abdomen just right of midline. Tip over the midline pelvis. Mild amount of free peritoneal fluid likely related to patient's peritoneal dialysis. Tiny amount of free peritoneal air also likely related to patient's peritoneal dialysis as this is unchanged. No focal inflammatory process. Musculoskeletal: Unchanged. IMPRESSION: Mild amount of  free peritoneal fluid and tiny amount of free peritoneal air likely related patient's peritoneal dialysis. Dialysis catheter over the midline pelvis without significant change. Diverticulosis of the colon without active inflammation. Postsurgical changes as described. Electronically Signed   By: Marin Olp M.D.   On: 04/18/2018 15:50   Dg Chest  Port 1 View  Result Date: 04/18/2018 CLINICAL DATA:  Fever EXAM: PORTABLE CHEST 1 VIEW COMPARISON:  03/16/2018 FINDINGS: The heart size and mediastinal contours are within normal limits. Both lungs are clear. The visualized skeletal structures are unremarkable. IMPRESSION: No active disease. Electronically Signed   By: Kathreen Devoid   On: 04/18/2018 11:31   EKG: Independently reviewed. Showed Sinus Tachycardia at 100 bpm and LAD; Had No appreciable ST elevation on my evaluation.  Assessment/Plan Active Problems:   Dyslipidemia, goal LDL below 70   S/P laparoscopic sleeve gastrectomy   B12 deficiency anemia   Gout due to renal impairment   Anemia of chronic disease   ESRD on peritoneal dialysis ([facility])   DM (diabetes mellitus), type 2 with renal complications (HCC)   Hypertension   Folate deficiency   Dialysis-associated peritonitis ([place])   Coronary artery disease due to lipid rich plaque   Peritonitis (HCC)   Dyspnea on exertion  Sepsis secondary to suspected peritonitis with rule out other etiologies -Placed in the stepdown unit inpatient -Patient was febrile, tachypneic, tachycardic, hypotensive on admission with a lactic acid of 2.2.  She is not have any leukocytosis currently -Broad-spectrum antibiotics initiated after obtaining blood cultures as well as peritoneal culture -We will obtain a urinalysis and urine culture as well patient is able  -Chest x-ray done on admission was independently reviewd and I am in agreement with The Radiologist's read of "The heart size and mediastinal contours are within normal limits. Both lungs are clear. The  visualized skeletal structures are unremarkable. -Lactic acid level now trended down to 0.8 -Procalcitonin level was elevated 3.69 -Follow-up on blood cultures, urinalysis, urine culture as well as peritoneal fluid analysis -We will obtain a CT of the abdomen pelvis without contrast -Continue with broad-spectrum antibiotics including IV cefepime and IV vancomycin with pharmacy to dose -Patient received a dose of IV Flagyl in the ED -Given a 500 mL bolus and a total of 1 L in the ED combined with antibiotics -We will give another 500 mg as needed up to 1.5 L for systolic blood pressure less than 90 -Nephrology consulted for further evaluation recommendations -Because patient was severely hypotensive on admission is asked to evaluate the patient but recommended hospitalist admit given her improvement -Patient is a very low risk for COVID-19 disease as she has been self isolated for the last month and and is not exhibiting any cough, Clear CXR, and Not requiring any Supplemental O2 via [place]. She is not Leukopenic and Thrombocytopenic -Continue to Monitor for S/Sx of Infection  -Repeat CBC in AM   End-stage disease requiring peritoneal dialysis -We will consult nephrology for further evaluation recommendations -We will set 60 mg p.o. with supper along with calcium acetate 1334 milligrams p.o. 3 times daily with meals along with ferric citrate 210 mg p.o. 3 times daily with meals -Patient's BUN and creatinine on admission was similar to priors and was 35/15.69 and last time it was checked it was 46/15.69 back on 04/09/2018 next  Hyponatremia -Mild in the setting of dehydration from sepsis and nausea vomiting -Patient's sodium is 131 -Given gentle IV fluid with a 500 mL bolus and will continue fluid management per nephrology -Repeat CMP in the morning  Hypokalemia -Patient's potassium was 2.6 this morning likely from nausea vomiting -Replete with IV KCl 30 mill: 6-continue monitor replete as  necessary -Repeat CMP in the a.m.  Hypomagnesemia -Patient magnesium level this morning was 1.5 -Replete with IV mag sulfate 1 g -Continue  monitor replete as necessary -Repeat medium level in the a.m.  Hyperglycemia in the setting of Diabetes  -In the setting of sepsis and diabetes mellitus type 2 -Patient blood sugar this morning was 136 -Last HbA1c was 6.6  -Will place on a sensitive Novolog SSI AC -Continue monitor and trend blood glucoses  Abnormal AST -In the setting of sepsis -AST was 219 -We will be checking a CT of the abdomen pelvis without contrast -If continues to worsen will obtain a right upper quadrant ultrasound as well as an acute hepatitis panel -Continue to monitor and trend liver function repeat CMP in the a.m.  Dyspnea on Exertion -Chronic -Added Guaifenesin and Xopenex/Ipratropium with pRN Albuterol -May consider ECHO -Repeat CXR in AM  Obesity -Estimated body mass index is 39.94 kg/m as calculated from the following:   Height as of this encounter: 5' 5"  (1.651 m).   Weight as of this encounter: 108.9 kg. -Weight Loss Counseling and Dietary Education given   Hypertension -Not on any Antihypertensives except Carvedilol which will be held  HLD -Holding Atorvastatin due to Abnormal AST  CAD -C/w ASA and holding Statin and Carvedilol -Not complaining of CP or Anginal Complaints   DVT prophylaxis: Heparin 5,000 units sq q8h Code Status: FULL CODE Family Communication: No family present at bedside  Disposition Plan: Remain Inpatient until improving Consults called: Nephrology; Admission status: Inpatient SDU  Severity of Illness: The appropriate patient status for this patient is INPATIENT. Inpatient status is judged to be reasonable and necessary in order to provide the required intensity of service to ensure the patient's safety. The patient's presenting symptoms, physical exam findings, and initial radiographic and laboratory data in the context  of their chronic comorbidities is felt to place them at high risk for further clinical deterioration. Furthermore, it is not anticipated that the patient will be medically stable for discharge from the hospital within 2 midnights of admission. The following factors support the patient status of inpatient.   " The patient's presenting symptoms include Abdominal Pain and Fever. " The worrisome physical exam findings include Tenderness to palpate. " The initial radiographic and laboratory data are worrisome because of Hypokalemia, Hyponatremia. " The chronic co-morbidities are stated as above.  * I certify that at the point of admission it is my clinical judgment that the patient will require inpatient hospital care spanning beyond 2 midnights from the point of admission due to high intensity of service, high risk for further deterioration and high frequency of surveillance required.Kerney Elbe, D.O. [facility] is on [place]  If 7PM-7AM, please contact night-coverage [email or web address] Password [email or web address]  04/18/2018, 4:32 PM

## 2018-04-18 NOTE — ED Notes (Signed)
Date and time results received: 04/18/18  1232   Test: Postassium Critical Value: 2.6  Name of Provider Notified: Petrucelli  EDP notified

## 2018-04-18 NOTE — Progress Notes (Signed)
Pharmacy Antibiotic Note  Brenda Davis is a 53 y.o. female admitted on 04/18/2018 with fever.  Pharmacy has been consulted for vancomycin and cefepime dosing. Pt with Tmax 102.3 and WBC is WNL. Pt is on peritoneal dialysis.   Plan: Vancomycin 2500mg  IV x 1 then will check a level in 3-4 days to determine re-dosing needs Cefepime 2gm IV x 1 then 1gm IV Q24H F/u renal plans, C&S, clinical status and vanc levels as needed  Height: 5\' 5"  (165.1 cm) Weight: 240 lb (108.9 kg) IBW/kg (Calculated) : 57  Temp (24hrs), Avg:101.1 F (38.4 C), Min:99.9 F (37.7 C), Max:102.3 F (39.1 C)  No results for input(s): WBC, CREATININE, LATICACIDVEN, VANCOTROUGH, VANCOPEAK, VANCORANDOM, GENTTROUGH, GENTPEAK, GENTRANDOM, TOBRATROUGH, TOBRAPEAK, TOBRARND, AMIKACINPEAK, AMIKACINTROU, AMIKACIN in the last 168 hours.  Estimated Creatinine Clearance: 5.1 mL/min (A) (by C-G formula based on SCr of 15.69 mg/dL (H)).    Allergies  Allergen Reactions  . Amlodipine Swelling  . Clonidine Derivatives Swelling    Limbs swell  . Doxycycline Nausea And Vomiting    "I threw up for 3 hours"  . Welchol [Colesevelam Hcl] Nausea Only    Antimicrobials this admission: Vanc 3/30>> Cefepime 3/30>> Flagyl x 1 3/30  Dose adjustments this admission: N/A  Microbiology results: Pending  Thank you for allowing pharmacy to be a part of this patient's care.  Avilyn Virtue, Rande Lawman 04/18/2018 11:26 AM

## 2018-04-18 NOTE — ED Notes (Signed)
Spoke to pt about need for urine sample and helped pt onto bedpan to attempt to collect urine sample. Pt agrees but states that she does not urinate often. Not able to collect urine at this time.

## 2018-04-18 NOTE — ED Triage Notes (Signed)
Reports shortness of breath with fever.  Currently on peritoneal dialysis.  Given 4mg  IV zofran via EMS.

## 2018-04-18 NOTE — ED Notes (Signed)
Pt assisted to bedpan, unable to have BM or provide urine sample

## 2018-04-18 NOTE — ED Provider Notes (Addendum)
[facility] EMERGENCY DEPARTMENT Provider Note   CSN: [id number] Arrival date & time: 04/18/18  1058    History   Chief Complaint Chief Complaint  Patient presents with  . Fever    HPI Brenda Davis is a 53 y.o. female with a hx of HTN, hyperlipidemia, T2DM, CHF, s/p gastric sleeve, and  ESRD on peritoneal dialysis who presents to the ED from dialysis center for fever for past few days. Patient states that she has been having issues with peritonitis, had been on 14 day course of abx, recently completed this w/ clearance of fluid per dialysis. Over the past few days she has had increased generalized abdominal pain that is currently a 10/10 in severity, no alleviating/aggravating factors. Notes associated low grade temps at 99 at home as well as general fatigue. Today went to dialysis given sxs and was sent to the ER given fever to 100.5. She was also noted to be hypotensive.. Did not dialyze today. Reports nausea alleviated by zofran by EMS. Denies vomiting, diarrhea, melena, hematochezia, URI sxs, or cough. She notes baseline DOE which is unchanged. No recent travel or confirmed covid 19 exposures.      HPI  Past Medical History:  Diagnosis Date  . Anemia   . Chronic kidney disease    STAGE 3 CHRONIC KIDNEY DISEASE SECONDARY TO DIABETIC GLOMERULOSCLEROSIS AND UNCONTROLLED HYPERTENSION - PER OFFICE NOTES DR. Florene Glen [facility].  Marland Kitchen ESRD on dialysis [facility])    "MWF; [address] (05/06/2016)  . Gout   . HCAP (healthcare-associated pneumonia) 05/06/2016  . History of blood transfusion    "related to surgery"  . Hyperlipidemia   . Hypertension   . Morbid obesity ([place])   . Pain    LEFT SHOULDER PAIN - WAS SEEN AT AN URGENT CARE - GIVEN SLING FOR COMFORT AND TOLD ROM AS TOLERATED.  Marland Kitchen Palpitations 09/24/2016  . Type II diabetes mellitus ([place])    "gastric sleeve OR corrected this" (05/06/2016)    Patient Active Problem List   Diagnosis Date  Noted  . Dialysis-associated peritonitis ([place]) 04/12/2018  . Coronary artery disease due to lipid rich plaque 04/12/2018  . Early satiety 04/12/2018  . Elevated troponin 03/08/2018  . Folate deficiency 03/08/2018  . CAD in native artery   . Hepatic cirrhosis ([place]) 11/24/2017  . Omental infarction ([place]) 08/25/2017  . ESRD on peritoneal dialysis ([place]) 08/25/2017  . DM (diabetes mellitus), type 2 with renal complications ([place]) [birth year]1937  . Deficiency anemia 08/24/2017  . Elevated lipase 08/24/2017  . Chronic heart failure with preserved ejection fraction ([place]) 06/22/2017  . Hypertension 09/15/2016  . Sensorineural hearing loss (SNHL), bilateral 06/29/2016  . Allergic rhinitis 06/27/2016  . Severe persistent asthma with exacerbation 05/12/2016  . Anemia of chronic disease 05/06/2016  . Routine general medical examination at a health care facility 12/05/2015  . Cervical cancer screening 12/05/2015  . Visit for screening mammogram 01/23/2015  . Sinus pause 01/04/2015  . Gout due to renal impairment 09/13/2014  . Thiamine deficiency 12/02/2012  . Meralgia paraesthetica 11/28/2012  . S/P laparoscopic sleeve gastrectomy 11/28/2012  . B12 deficiency anemia 11/28/2012  . Morbid obesity with BMI of 40.0-44.9, adult ([place]) 07/22/2012  . Dyslipidemia, goal LDL below 70 04/09/2008  . CKD (chronic kidney disease) stage V requiring chronic dialysis (MWF) 02/08/2008    Past Surgical History:  Procedure Laterality Date  . AV FISTULA PLACEMENT Left 01/03/2015   Procedure: BRACHIAL CEPHALIC ARTERIOVENOUS  FISTULA CREATION LEFT ARM;  Surgeon: Angelia Mould, MD;  Location: [facility];  Service: Vascular;  Laterality: Left;  . CARPAL TUNNEL RELEASE Right   . [place]  . CHOLECYSTECTOMY  09/14/2017  . ESOPHAGOGASTRODUODENOSCOPY N/A 09/04/2012   Procedure: ESOPHAGOGASTRODUODENOSCOPY (EGD);  Surgeon: Shann Medal, MD;  Location: [facility];  Service: General;  Laterality: N/A;   PF  . ESOPHAGOGASTRODUODENOSCOPY (EGD) WITH ESOPHAGEAL DILATION N/A 09/29/2012   Procedure: ESOPHAGOGASTRODUODENOSCOPY (EGD) WITH ESOPHAGEAL DILATION;  Surgeon: Milus Banister, MD;  Location: [facility];  Service: Endoscopy;  Laterality: N/A;  . INSERTION OF DIALYSIS CATHETER N/A 01/03/2015   Procedure: INSERTION OF DIALYSIS CATHETER RIGHT INTERNAL JUGULAR;  Surgeon: Angelia Mould, MD;  Location: [facility];  Service: Vascular;  Laterality: N/A;  . LAPAROSCOPIC GASTRIC SLEEVE RESECTION N/A 07/19/2012   Procedure: LAPAROSCOPIC SLEEVE GASTRECTOMY with EGD;  Surgeon: Madilyn Hook, DO;  Location: [facility];  Service: General;  Laterality: N/A;  laparoscopic sleeve gastrectomy with EGD  . LEFT HEART CATH AND CORONARY ANGIOGRAPHY N/A 02/25/2018   Procedure: LEFT HEART CATH AND CORONARY ANGIOGRAPHY;  Surgeon: Leonie Man, MD;  Location: [place] [facility];  Service: Cardiovascular;  Laterality: N/A;  . PERIPHERAL VASCULAR CATHETERIZATION Left 05/23/2015   Procedure: Nolon Stalls;  Surgeon: Conrad Pavillion, MD;  Location: [place] [facility];  Service: Cardiovascular;  Laterality: Left;  upper aRM  . PERIPHERAL VASCULAR CATHETERIZATION Left 05/23/2015   Procedure: Peripheral Vascular Balloon Angioplasty;  Surgeon: Conrad , MD;  Location: [facility];  Service: Cardiovascular;  Laterality: Left;  av fistula  . TUBAL LIGATION  1993  . UPPER GI ENDOSCOPY N/A 07/19/2012   Procedure: UPPER GI ENDOSCOPY;  Surgeon: Madilyn Hook, DO;  Location: [facility];  Service: General;  Laterality: N/A;     OB History   No obstetric history on file.      Home Medications    Prior to Admission medications   Medication Sig Start Date End Date Taking? Authorizing Provider  acetaminophen (TYLENOL) 325 MG tablet Take 650 mg by mouth every 6 (six) hours as needed for mild pain.    [provider]  aspirin EC 81 MG EC tablet Take 1 tablet (81 mg total) by mouth daily. 02/26/18   Norval Morton, MD   atorvastatin (LIPITOR) 80 MG tablet Take 1 tablet (80 mg total) by mouth daily at 6 PM. 04/12/18   [name], MD  AURYXIA 1 GM 210 MG(Fe) tablet Take 210 mg by mouth 3 (three) times daily with meals.  04/07/17   [provider]  calcium acetate (PHOSLO) 667 MG capsule Take 2 capsules (1,334 mg total) by mouth 3 (three) times daily with meals. 01/07/15   Eugenie Filler, MD  carvedilol (COREG) 3.125 MG tablet Take 1 tablet (3.125 mg total) by mouth 2 (two) times daily with a meal. 02/26/18   Norval Morton, MD  cinacalcet (SENSIPAR) 30 MG tablet Take 2 tablets (60 mg total) by mouth daily with supper. 02/26/18   Norval Morton, MD  Cyanocobalamin (B-12 PO) Take 1 tablet by mouth daily.    [provider]  folic acid (FOLVITE) 1 MG tablet Take 1 tablet (1 mg total) by mouth daily. 03/08/18   [name], MD  gentamicin cream (GARAMYCIN) 0.1 % Apply 1 application topically See admin instructions. Apply to exit site daily as directed. 04/26/17   [provider]  ondansetron (ZOFRAN ODT) 8 MG disintegrating tablet Take 1 tablet (8  mg total) by mouth every 8 (eight) hours as needed for nausea or vomiting. 04/09/18   Jola Schmidt, MD  oxyCODONE (OXY IR/ROXICODONE) 5 MG immediate release tablet Take 1 tablet (5 mg total) by mouth every 6 (six) hours as needed for severe pain. 04/12/18   [name], MD    Family History Family History  Problem Relation Age of Onset  . Hypertension Mother   . Mitral valve prolapse Mother   . Diabetes Father   . Arthritis Other   . Hyperlipidemia Other   . Cancer Neg Hx   . Heart disease Neg Hx   . Kidney disease Neg Hx   . Stroke Neg Hx     Social History Social History   Tobacco Use  . Smoking status: Former Smoker    Packs/day: 1.00    Years: 16.00    Pack years: 16.00    Types: Cigarettes    Last attempt to quit: 2009    Years since quitting: 11.2  . Smokeless tobacco: Never Used  Substance Use Topics  . Alcohol  use: Never    Frequency: Never  . Drug use: No     Allergies   Amlodipine; Clonidine derivatives; Doxycycline; and Welchol [colesevelam hcl]   Review of Systems Review of Systems  Constitutional: Positive for fatigue and fever.  HENT: Negative for congestion, ear pain and sore throat.   Respiratory: Positive for shortness of breath (baseline unchanged). Negative for cough.   Cardiovascular: Negative for chest pain.  Gastrointestinal: Positive for abdominal pain and nausea. Negative for blood in stool, constipation, diarrhea and vomiting.  All other systems reviewed and are negative.    Physical Exam Updated Vital Signs Temp 99.9 F (37.7 C) (Oral)   Resp 20   Ht 5\' 5"  (1.651 m)   Wt 108.9 kg   SpO2 99%   BMI 39.94 kg/m   Physical Exam Vitals signs and nursing note reviewed.  Constitutional:      General: She is not in acute distress.    Appearance: She is well-developed. She is not toxic-appearing.  HENT:     Head: Normocephalic and atraumatic.  Eyes:     General:        Right eye: No discharge.        Left eye: No discharge.     Conjunctiva/sclera: Conjunctivae normal.  Neck:     Musculoskeletal: Neck supple.  Cardiovascular:     Rate and Rhythm: Normal rate and regular rhythm.  Pulmonary:     Effort: Pulmonary effort is normal. No respiratory distress.     Breath sounds: Normal breath sounds. No wheezing, rhonchi or rales.  Abdominal:     General: There is no distension.     Palpations: Abdomen is soft.     Tenderness: There is abdominal tenderness (generalized). There is no guarding or rebound.  Skin:    General: Skin is warm and dry.     Findings: No rash.  Neurological:     Mental Status: She is alert.     Comments: Clear speech.   Psychiatric:        Behavior: Behavior normal.    ED Treatments / Results  Labs (all labs ordered are listed, but only abnormal results are displayed) Labs Reviewed  LACTIC ACID, PLASMA - Abnormal; Notable for the  following components:      Result Value   Lactic Acid, Venous 2.2 (*)    All other components within normal limits  COMPREHENSIVE METABOLIC PANEL -  Abnormal; Notable for the following components:   Sodium 131 (*)    Potassium 2.6 (*)    Chloride 94 (*)    CO2 20 (*)    Glucose, Bld 136 (*)    BUN 35 (*)    Creatinine, Ser 15.69 (*)    Calcium 6.6 (*)    Total Protein 5.5 (*)    Albumin 2.3 (*)    AST 219 (*)    Alkaline Phosphatase 150 (*)    GFR calc non Af Amer 2 (*)    GFR calc Af Amer 3 (*)    Anion gap 17 (*)    All other components within normal limits  CBC WITH DIFFERENTIAL/PLATELET - Abnormal; Notable for the following components:   RBC 3.76 (*)    Lymphs Abs 0.4 (*)    Abs Immature Granulocytes 0.13 (*)    All other components within normal limits  CBG MONITORING, ED - Abnormal; Notable for the following components:   Glucose-Capillary 127 (*)    All other components within normal limits  CULTURE, BLOOD (ROUTINE X 2)  CULTURE, BLOOD (ROUTINE X 2)  CULTURE, BODY FLUID-BOTTLE  GRAM STAIN  LIPASE, BLOOD  LACTIC ACID, PLASMA  URINALYSIS, ROUTINE W REFLEX MICROSCOPIC  LACTATE DEHYDROGENASE, PLEURAL OR PERITONEAL FLUID  GLUCOSE, PLEURAL OR PERITONEAL FLUID  PROTEIN, PLEURAL OR PERITONEAL FLUID  ALBUMIN, PLEURAL OR PERITONEAL FLUID    EKG EKG Interpretation  Date/Time:  Monday April 18 2018 11:06:52 EDT Ventricular Rate:  100 PR Interval:    QRS Duration: 83 QT Interval:  370 QTC Calculation: 478 R Axis:   -38 Text Interpretation:  Sinus tachycardia Left axis deviation Low voltage, precordial leads Borderline T wave abnormalities Abnormal ekg Confirmed by Carmin Muskrat [phone]) on 04/18/2018 11:16:52 AM Also confirmed by Carmin Muskrat ([id number]), editor Philomena Doheny [phone])  on 04/18/2018 11:29:03 AM   Radiology Dg Chest Port 1 View  Result Date: 04/18/2018 CLINICAL DATA:  Fever EXAM: PORTABLE CHEST 1 VIEW COMPARISON:  03/16/2018 FINDINGS: The heart size  and mediastinal contours are within normal limits. Both lungs are clear. The visualized skeletal structures are unremarkable. IMPRESSION: No active disease. Electronically Signed   By: Kathreen Devoid   On: 04/18/2018 11:31    Procedures Procedures (including critical care time)  CRITICAL CARE Performed by: Kennith Maes   Total critical care time: 40 minutes  Critical care time was exclusive of separately billable procedures and treating other patients.  Critical care was necessary to treat or prevent imminent or life-threatening deterioration.  Critical care was time spent personally by me on the following activities: development of treatment plan with patient and/or surrogate as well as nursing, discussions with consultants, evaluation of patient's response to treatment, examination of patient, obtaining history from patient or surrogate, ordering and performing treatments and interventions, ordering and review of laboratory studies, ordering and review of radiographic studies, pulse oximetry and re-evaluation of patient's condition.   Medications Ordered in ED Medications  ceFEPIme (MAXIPIME) 2 g in sodium chloride 0.9 % 100 mL IVPB (has no administration in time range)  metroNIDAZOLE (FLAGYL) IVPB 500 mg (has no administration in time range)  vancomycin (VANCOCIN) 2,500 mg in sodium chloride 0.9 % 500 mL IVPB (2,500 mg Intravenous New Bag/Given 04/18/18 1327)  ceFEPIme (MAXIPIME) 1 g in sodium chloride 0.9 % 100 mL IVPB (has no administration in time range)  potassium chloride 10 mEq in 100 mL IVPB (10 mEq Intravenous New Bag/Given 04/18/18 1328)  0.9 %  sodium chloride  infusion (500 mLs Intravenous New Bag/Given 04/18/18 1326)  acetaminophen (TYLENOL) tablet 650 mg (650 mg Oral Given 04/18/18 1209)  sodium chloride 0.9 % bolus 500 mL (500 mLs Intravenous Bolus 04/18/18 1330)     Initial Impression / Assessment and Plan / ED Course  I have reviewed the triage vital signs and the  nursing notes.  Pertinent labs & imaging results that were available during my care of the patient were reviewed by me and considered in my medical decision making (see chart for details).   Patient presents to the emergency department from dialysis due to fever associated w/ fatigue and generalized abdominal pain. Of note patient recently compelted antibiotic treatment for peritonitis. Upon arrival rectal temp noted to be elevated at 102.3- will give tylenol, vitals otherwise WNL on initial assessment. Plan for labs to include blood cultures & peritoneal fluid analysis & IV abx. Close monitoring of BP & overall status.   Work-up reviewed: CBC: No leukocytosis or significant anemia.  CMP: Multiple electrolyte derrangements including hypokalemia @ 2.6 (plan for a few runs of IV potassium), hyponatremia @ 131, hypochloremia @ 94, hypocalcemia @ 6.6. Renal function @ baseline. AST up from prior, T bili WNL. Anion gap elevated.  Lipase: WNL Lactic acid: Elevated @ 2.2  CXR: Negative, no fluid overload.   Concern for sepsis secondary to peritonitis w/ her hx & generalized abdominal tenderness Given patient's presentation & evaluation thus far will plan to discuss with nephrology.  Pressures intermittently soft, will call code sepsis- has already had sepsis work-up and abx at this time, have not given 30 cc/kg bolus given dialysis patient.    13:10: CONSULT: Discussed case with patient's primary nephrologist Dr. Marval Regal- recommends fluid bolus, patient not typically hypotensive, states she was sent from dialysis due to concern for sepsis w/ her peritonitis history and likely requires admission.   13:38: CONSULT: Discussed case with hospitalist Dr. Alfredia Ferguson with triad requesting PCCM consultation for possible admission.    13:45: CONSULT: Discussed case with intensivist w/ PCCM Dr. Ander Slade- recommends hospitalist admission. Can re consult if pressures continue to drop with fluids.   14:00: CONSULT:  Re-discussed with Dr. Alfredia Ferguson who accepts admission.   This is a shared visit with supervising physician Dr. Vanita Panda who has independently evaluated patient & provided guidance in evaluation/management/disposition, in agreement.   Final Clinical Impressions(s) / ED Diagnoses   Final diagnoses:  Sepsis, due to unspecified organism, unspecified whether acute organ dysfunction present [facility])  Hypokalemia    ED Discharge Orders    None       Amaryllis Dyke, PA-C 04/18/18 [address] PA-C 04/18/18 1636    Carmin Muskrat, MD 04/19/18 [phone]

## 2018-04-18 NOTE — ED Notes (Signed)
Date and time results received: 04/18/18 1230   Test: lactic acid Critical Value: 2.2  Name of Provider Notified: Petrucelli  Orders Received? Or Actions Taken?: Actions Taken: EDP notified

## 2018-04-18 NOTE — ED Notes (Signed)
Hemodialysis nurse at the bedside for peritoneal fluid removal.

## 2018-04-18 NOTE — Progress Notes (Signed)
Patient complained of persistent generalized itching. PRN Sarna lotion applied with minimal relief. Paged NP Baltazar Najjar at [phone] for possible Benadryl order. Will continue to monitor.   Upon assisting patient with repositioning in bed/turning found 2 blue/white capsules and 3 small pills under patient in bed. Inquired if patient was given medication and if medications looked familiar. Patient stated medications did not look familiar. Meds discarded in stericycle in med room.   Provided patient's husband Kristine Royal with patient room phone number to speak to pt directly at [phone]. Patient husband called back with questions at [phone]. Provided husband with patient's most recent VS, indicated patient stable and that temperature and BP will continue to be monitored. Indicated patient complaining of abdominal pain and PRN medication given for pain. Informed husband attending MD establishes care plan and is able to further communicate plan of care. Suggested husband call back between [phone] after physician rounds to receive updates/further information.

## 2018-04-19 ENCOUNTER — Inpatient Hospital Stay (HOSPITAL_COMMUNITY): Payer: Medicare Other

## 2018-04-19 DIAGNOSIS — R109 Unspecified abdominal pain: Secondary | ICD-10-CM

## 2018-04-19 DIAGNOSIS — R509 Fever, unspecified: Secondary | ICD-10-CM

## 2018-04-19 LAB — BODY FLUID CELL COUNT WITH DIFFERENTIAL: Total Nucleated Cell Count, Fluid: 4 cu mm (ref 0–1000)

## 2018-04-19 LAB — CBC WITH DIFFERENTIAL/PLATELET
Abs Immature Granulocytes: 0.07 10*3/uL (ref 0.00–0.07)
Basophils Absolute: 0 10*3/uL (ref 0.0–0.1)
Basophils Relative: 0 %
Eosinophils Absolute: 0.2 10*3/uL (ref 0.0–0.5)
Eosinophils Relative: 4 %
HCT: 35.2 % — ABNORMAL LOW (ref 36.0–46.0)
Hemoglobin: 11.3 g/dL — ABNORMAL LOW (ref 12.0–15.0)
Immature Granulocytes: 2 %
Lymphocytes Relative: 18 %
Lymphs Abs: 0.7 10*3/uL (ref 0.7–4.0)
MCH: 31.4 pg (ref 26.0–34.0)
MCHC: 32.1 g/dL (ref 30.0–36.0)
MCV: 97.8 fL (ref 80.0–100.0)
Monocytes Absolute: 0.2 10*3/uL (ref 0.1–1.0)
Monocytes Relative: 5 %
Neutro Abs: 2.6 10*3/uL (ref 1.7–7.7)
Neutrophils Relative %: 71 %
Platelets: 181 10*3/uL (ref 150–400)
RBC: 3.6 MIL/uL — ABNORMAL LOW (ref 3.87–5.11)
RDW: 14.4 % (ref 11.5–15.5)
WBC Morphology: INCREASED
WBC: 3.7 10*3/uL — ABNORMAL LOW (ref 4.0–10.5)
nRBC: 0 % (ref 0.0–0.2)

## 2018-04-19 LAB — COMPREHENSIVE METABOLIC PANEL
ALT: 13 U/L (ref 0–44)
AST: 287 U/L — ABNORMAL HIGH (ref 15–41)
Albumin: 2.1 g/dL — ABNORMAL LOW (ref 3.5–5.0)
Alkaline Phosphatase: 129 U/L — ABNORMAL HIGH (ref 38–126)
Anion gap: 19 — ABNORMAL HIGH (ref 5–15)
BUN: 42 mg/dL — ABNORMAL HIGH (ref 6–20)
CO2: 20 mmol/L — ABNORMAL LOW (ref 22–32)
Calcium: 6.6 mg/dL — ABNORMAL LOW (ref 8.9–10.3)
Chloride: 92 mmol/L — ABNORMAL LOW (ref 98–111)
Creatinine, Ser: 16.56 mg/dL — ABNORMAL HIGH (ref 0.44–1.00)
GFR calc Af Amer: 3 mL/min — ABNORMAL LOW (ref >60)
GFR calc non Af Amer: 2 mL/min — ABNORMAL LOW (ref >60)
Glucose, Bld: 89 mg/dL (ref 70–99)
Potassium: 2.9 mmol/L — ABNORMAL LOW (ref 3.5–5.1)
Sodium: 131 mmol/L — ABNORMAL LOW (ref 135–145)
Total Bilirubin: 0.9 mg/dL (ref 0.3–1.2)
Total Protein: 5.3 g/dL — ABNORMAL LOW (ref 6.5–8.1)

## 2018-04-19 LAB — GLUCOSE, CAPILLARY
Glucose-Capillary: 100 mg/dL — ABNORMAL HIGH (ref 70–99)
Glucose-Capillary: 87 mg/dL (ref 70–99)
Glucose-Capillary: 130 mg/dL — ABNORMAL HIGH (ref 70–99)

## 2018-04-19 LAB — RESPIRATORY PANEL BY PCR

## 2018-04-19 LAB — PROCALCITONIN: Procalcitonin: 4.64 ng/mL

## 2018-04-19 LAB — AMMONIA: Ammonia: 40 umol/L — ABNORMAL HIGH (ref 9–35)

## 2018-04-19 LAB — HELICOBACTER PYLORI  SPECIAL ANTIGEN
MICRO NUMBER:: 362390
SPECIMEN QUALITY: ADEQUATE

## 2018-04-19 MED ORDER — GENTAMICIN SULFATE 0.1 % EX CREA
1.0000 "application " | TOPICAL_CREAM | Freq: Every day | CUTANEOUS | Status: DC
  Administered 2018-04-19 (×2): 1 via TOPICAL
  Filled 2018-04-19: qty 15

## 2018-04-19 MED ORDER — ALUM & MAG HYDROXIDE-SIMETH 200-200-20 MG/5ML PO SUSP: 15.0000 mL | ORAL | Status: DC | PRN

## 2018-04-19 MED ORDER — POTASSIUM CHLORIDE CRYS ER 20 MEQ PO TBCR: 40.0000 meq | EXTENDED_RELEASE_TABLET | Freq: Once | ORAL | Status: DC

## 2018-04-19 MED ORDER — PANTOPRAZOLE SODIUM 40 MG PO TBEC
40.0000 mg | DELAYED_RELEASE_TABLET | Freq: Every day | ORAL | Status: DC
  Administered 2018-04-19 – 2018-04-26 (×3): 40 mg via ORAL
  Filled 2018-04-19 (×7): qty 1

## 2018-04-19 MED ORDER — HEPARIN 1000 UNIT/ML FOR PERITONEAL DIALYSIS
INTRAPERITONEAL | Status: DC | PRN
  Filled 2018-04-19: qty 5000

## 2018-04-19 MED ORDER — HEPARIN 1000 UNIT/ML FOR PERITONEAL DIALYSIS: 500.0000 [IU] | INTRAMUSCULAR | Status: DC | PRN

## 2018-04-19 MED ORDER — LACTULOSE 10 GM/15ML PO SOLN
10.0000 g | Freq: Two times a day (BID) | ORAL | Status: DC
  Administered 2018-04-19 (×2): 10 g via ORAL
  Filled 2018-04-19 (×3): qty 15

## 2018-04-19 MED ORDER — POTASSIUM CHLORIDE CRYS ER 20 MEQ PO TBCR
40.0000 meq | EXTENDED_RELEASE_TABLET | Freq: Once | ORAL | Status: AC
  Administered 2018-04-19: 40 meq via ORAL
  Filled 2018-04-19: qty 2

## 2018-04-19 MED ORDER — DELFLEX-LC/1.5% DEXTROSE 344 MOSM/L IP SOLN: INTRAPERITONEAL | Status: DC

## 2018-04-19 NOTE — Progress Notes (Signed)
Patient ID: Brenda Davis, female   DOB: 23-Jul-1965, 53 y.o.   MRN: [id number]  PROGRESS NOTE    Brenda Davis  PHX:[id number] DOB: 05-Jan-1966 DOA: 04/18/2018 PCP: [name], MD   Brief Narrative:  53 year old female with history of end-stage renal disease secondary to diabetic glomerulosclerosis and uncontrolled hypertension previously on hemodialysis, currently on peritoneal dialysis; gout, hypertension, hyperlipidemia, morbid obesity, diabetes mellitus type 2, chronic anemia, recent Staphylococcus warneri peritonitis treated with antibiotics with last Ancef dose on 04/13/2018 presented on 04/18/2018 with abdominal pain and fever.  Patient was started on intravenous antibiotics for probable peritonitis. Nephrology was consulted.  Assessment & Plan:   Active Problems:   Dyslipidemia, goal LDL below 70   S/P laparoscopic sleeve gastrectomy   B12 deficiency anemia   Gout due to renal impairment   Anemia of chronic disease   ESRD on peritoneal dialysis (HCC)   DM (diabetes mellitus), type 2 with renal complications ([place])   Hypertension   Folate deficiency   Dialysis-associated peritonitis ([place])   Coronary artery disease due to lipid rich plaque   Peritonitis (HCC)   Dyspnea on exertion  Sepsis: Present on admission -Questionable cause.  Initially there was a concern for peritonitis but peritoneal fluid WBC is only 4.  Hence she probably does not have peritonitis.  Spoke with Dr. Schertz/nephrology on phone and he agrees that she probably does not have peritonitis. -Does not appear to be having meningitis. - Follow cultures.  Hemodynamically improving.  Still having low-grade fevers. -CT of the abdomen and pelvis done on admission showed mild amount of free peritoneal fluid and tiny amount of free peritoneal air likely related to patient's peritoneal dialysis.  Diverticulosis without diverticulitis -Chest x-ray was negative for infiltrates.  I doubt that  patient meets any criteria for COVID-19.  She did not present with cough and has not required supplemental oxygen with negative chest x-ray.  Currently on droplet precautions.  Will get respiratory virus panel. -She was started on cefepime, vancomycin and Flagyl.  Will continue empiric broad-spectrum antibiotics for now.  Abdominal pain -Questionable cause.  She had a recent virtual visit with GI/Dr. Ardis Hughs who had recommended to empirically start omeprazole.  Will start Protonix.  She might need outpatient GI evaluation or inpatient if symptoms do not improve.  History of renal disease requiring peritoneal dialysis -Nephrology following.  Hyponatremia -Sodium 131 again today.  Monitor.  Will avoid any more IV fluids  Hypokalemia -We will replace.  Repeat a.m. labs   Diabetes mellitus type 2 -Last hemoglobin A1c was 6.6.  Blood sugar stable.  Continue CBGs with sliding scale insulin  Abnormal AST -Slightly worse today at 287.  ALT and bilirubin are normal.  Questionable cause.  Monitor.  Will get right upper quadrant ultrasound and hepatitis panel.  Morbid obesity -Outpatient follow-up  Hypertension -Blood pressure on the lower side but stable.  Coreg on hold  Hyperlipidemia--statin on hold secondary to abnormal AST  CAD -Continue aspirin.  Statin and Coreg on hold.  No chest pain.   DVT prophylaxis: Heparin Code Status: Full Family Communication: None at bedside  disposition Plan: Depends on clinical outcome  Consultants: Nephrology  Procedures: None  Antimicrobials: Cefepime, Flagyl and vancomycin from 04/18/2018 onwards   Subjective: Patient seen and examined at bedside.  She is sleepy, wakes up slightly on calling her name.  Does not feel much better.  Still having abdominal pain.  No overnight vomiting or diarrhea.  Objective: Vitals:   04/19/18 0006  04/19/18 0439 04/19/18 0931 04/19/18 0935  BP: 119/79 115/71 91/70 98/63   Pulse: 87 79    Resp: 10 17 18     Temp: 99.3 F (37.4 C) 99.6 F (37.6 C) 99.5 F (37.5 C)   TempSrc: Oral Oral Oral   SpO2: 96% 100% 100%   Weight:  117.4 kg    Height:        Intake/Output Summary (Last 24 hours) at 04/19/2018 0945 Last data filed at 04/19/2018 0400 Gross per 24 hour  Intake 500 ml  Output -  Net 500 ml   Filed Weights   04/18/18 1105 04/18/18 1614 04/19/18 0439  Weight: 108.9 kg 116.2 kg 117.4 kg    Examination:  General exam: Appears older than stated age.  Looks chronically ill.  No distress  respiratory system: Bilateral decreased breath sounds at bases with some scattered crackles Cardiovascular system: S1 & S2 heard, Rate controlled Gastrointestinal system: Abdomen is slightly distended, soft and mildly tender in the periumbilical and right upper quadrant regions. Normal bowel sounds heard.  Dressing present with peritoneal dialysis catheter present. Extremities: No cyanosis, clubbing; trace lower extremity edema Central nervous system: Sleepy, wakes up slightly on calling her name, extremely slow to respond to questions. No focal neurological deficits. Moving extremities Skin: No rashes or petechiae Psychiatry: Unable to assess because of current mental status.    Data Reviewed: I have personally reviewed following labs and imaging studies  CBC: Recent Labs  Lab 04/18/18 1143 04/19/18 0521  WBC 4.7 3.7*  NEUTROABS 3.7 2.6  HGB 12.0 11.3*  HCT 37.1 35.2*  MCV 98.7 97.8  PLT 205 768   Basic Metabolic Panel: Recent Labs  Lab 04/18/18 1143 04/18/18 1435 04/19/18 0521  NA 131*  --  131*  K 2.6*  --  2.9*  CL 94*  --  92*  CO2 20*  --  20*  GLUCOSE 136*  --  89  BUN 35*  --  42*  CREATININE 15.69*  --  16.56*  CALCIUM 6.6*  --  6.6*  MG  --  1.5*  --   PHOS  --  4.5  --    GFR: Estimated Creatinine Clearance: 5 mL/min (A) (by C-G formula based on SCr of 16.56 mg/dL (H)). Liver Function Tests: Recent Labs  Lab 04/18/18 1143 04/19/18 0521  AST 219* 287*  ALT 8  13  ALKPHOS 150* 129*  BILITOT 0.4 0.9  PROT 5.5* 5.3*  ALBUMIN 2.3* 2.1*   Recent Labs  Lab 04/18/18 1143  LIPASE 26   No results for input(s): AMMONIA in the last 168 hours. Coagulation Profile: No results for input(s): INR, PROTIME in the last 168 hours. Cardiac Enzymes: No results for input(s): CKTOTAL, CKMB, CKMBINDEX, TROPONINI in the last 168 hours. BNP (last 3 results) No results for input(s): PROBNP in the last 8760 hours. HbA1C: No results for input(s): HGBA1C in the last 72 hours. CBG: Recent Labs  Lab 04/18/18 1115 04/18/18 1859 04/18/18 2112  GLUCAP 127* 95 98   Lipid Profile: No results for input(s): CHOL, HDL, LDLCALC, TRIG, CHOLHDL, LDLDIRECT in the last 72 hours. Thyroid Function Tests: Recent Labs    04/18/18 1507  TSH 3.798   Anemia Panel: No results for input(s): VITAMINB12, FOLATE, FERRITIN, TIBC, IRON, RETICCTPCT in the last 72 hours. Sepsis Labs: Recent Labs  Lab 04/18/18 1143 04/18/18 1435 04/18/18 1821 04/19/18 0521  PROCALCITON  --  3.69  --  4.64  LATICACIDVEN 2.2* 0.8 1.4  --  Recent Results (from the past 240 hour(s))  Blood Culture (routine x 2)     Status: None (Preliminary result)   Collection Time: 04/18/18 11:32 AM  Result Value Ref Range Status   Specimen Description BLOOD RIGHT FOREARM  Final   Special Requests   Final    BOTTLES DRAWN AEROBIC AND ANAEROBIC Blood Culture adequate volume   Culture   Final    NO GROWTH < 24 HOURS Performed at [facility], [address] [place], [place] [address]    Report Status PENDING  Incomplete  Blood Culture (routine x 2)     Status: None (Preliminary result)   Collection Time: 04/18/18 11:40 AM  Result Value Ref Range Status   Specimen Description BLOOD RIGHT ANTECUBITAL  Final   Special Requests   Final    BOTTLES DRAWN AEROBIC AND ANAEROBIC Blood Culture adequate volume   Culture   Final    NO GROWTH < 24 HOURS Performed at [facility], [place] [address] [place], [place] [address]    Report Status PENDING  Incomplete  Culture, body fluid-bottle     Status: None (Preliminary result)   Collection Time: 04/18/18  1:47 PM  Result Value Ref Range Status   Specimen Description PERITONEAL CAVITY  Final   Special Requests NONE  Final   Culture   Final    NO GROWTH < 24 HOURS Performed at [facility], [place] [address] [place], [place] [address]    Report Status PENDING  Incomplete  Gram stain     Status: None   Collection Time: 04/18/18  1:47 PM  Result Value Ref Range Status   Specimen Description PERITONEAL CAVITY  Final   Special Requests NONE  Final   Gram Stain   Final    WBC PRESENT,BOTH PMN AND MONONUCLEAR NO ORGANISMS SEEN CYTOSPIN SMEAR Performed at [facility], [address], [place], [place] [address]    Report Status 04/18/2018 FINAL  Final  MRSA PCR Screening     Status: None   Collection Time: 04/18/18  2:37 PM  Result Value Ref Range Status   MRSA by PCR NEGATIVE NEGATIVE Final    Comment:        The GeneXpert MRSA Assay (FDA approved for NASAL specimens only), is one component of a comprehensive MRSA colonization surveillance program. It is not intended to diagnose MRSA infection nor to guide or monitor treatment for MRSA infections. Performed at [facility], [place] [address], [place], [place] [address]          Radiology Studies: Ct Abdomen Pelvis Wo Contrast  Result Date: 04/18/2018 CLINICAL DATA:  Fever a few days as recently completed treatment for staph peritonitis with last dose of Ancef given 04/13/2018. Fever this morning with vomiting and abdominal pain over the weekend. Patient with end-stage renal disease post dialysis this morning. EXAM: CT ABDOMEN AND PELVIS WITHOUT CONTRAST TECHNIQUE: Multidetector CT imaging of the abdomen and pelvis was performed following the standard protocol without IV contrast. COMPARISON:  04/09/2018, 03/27/2018 and 11/11/2017 as well as 02/05/2015  FINDINGS: Lower chest: Minimal linear atelectasis/scarring left base. Small sliding hiatal hernia. Hepatobiliary: Previous cholecystectomy. Liver and biliary tree are within normal. Pancreas: Normal. Spleen: Normal. Adrenals/Urinary Tract: Adrenal glands are normal. Kidneys are somewhat small without hydronephrosis or nephrolithiasis. Ureters and bladder are normal. Stomach/Bowel: Surgical sutures over the stomach compatible previous bypass procedure. Small bowel is normal. Appendix is normal. Mild diverticulosis over the  descending colon and sigmoid colon. Vascular/Lymphatic: Minimal calcified plaque over the abdominal aorta. No evidence of adenopathy. Reproductive: Normal. Other: Peritoneal dialysis catheter enters the lower abdomen just right of midline. Tip over the midline pelvis. Mild amount of free peritoneal fluid likely related to patient's peritoneal dialysis. Tiny amount of free peritoneal air also likely related to patient's peritoneal dialysis as this is unchanged. No focal inflammatory process. Musculoskeletal: Unchanged. IMPRESSION: Mild amount of free peritoneal fluid and tiny amount of free peritoneal air likely related patient's peritoneal dialysis. Dialysis catheter over the midline pelvis without significant change. Diverticulosis of the colon without active inflammation. Postsurgical changes as described. Electronically Signed   By: Marin Olp M.D.   On: 04/18/2018 15:50   X-ray Chest Pa And Lateral  Result Date: 04/19/2018 CLINICAL DATA:  Shortness of breath, nausea EXAM: CHEST - 2 VIEW COMPARISON:  04/18/2018 FINDINGS: Linear left base atelectasis or scarring. Heart is upper limits normal in size. Right lung clear. No effusions or acute bony abnormality. IMPRESSION: Left basilar atelectasis or scarring. Electronically Signed   By: Rolm Baptise M.D.   On: 04/19/2018 07:47   Dg Chest Port 1 View  Result Date: 04/18/2018 CLINICAL DATA:  Fever EXAM: PORTABLE CHEST 1 VIEW COMPARISON:   03/16/2018 FINDINGS: The heart size and mediastinal contours are within normal limits. Both lungs are clear. The visualized skeletal structures are unremarkable. IMPRESSION: No active disease. Electronically Signed   By: Kathreen Devoid   On: 04/18/2018 11:31        Scheduled Meds: . aspirin EC  81 mg Oral Daily  . calcium acetate  1,334 mg Oral TID WC  . cinacalcet  60 mg Oral Q supper  . ferric citrate  210 mg Oral TID WC  . folic acid  1 mg Oral Daily  . guaiFENesin  600 mg Oral BID  . heparin  5,000 Units Subcutaneous Q8H  . insulin aspart  0-5 Units Subcutaneous QHS  . insulin aspart  0-9 Units Subcutaneous TID WC  . ipratropium  0.5 mg Nebulization TID  . levalbuterol  0.63 mg Nebulization TID   Continuous Infusions: . sodium chloride 500 mL (04/18/18 1326)  . ceFEPime (MAXIPIME) IV    . metronidazole 500 mg (04/19/18 0520)  . sodium chloride       LOS: 1 day        Aline August, MD [facility] 04/19/2018, 9:45 AM

## 2018-04-19 NOTE — Progress Notes (Signed)
Patient's husband Brenda Davis called at [facility] inquired about patient status. Informed Brenda Davis had not completed shift assessment on patient, however, had briefly checked in on patient. Stated that there were no new updates available since day shift. RN inquired if Brenda Davis had spoken with patient in her room, Brenda Davis stated he had called room, but patient sounded tired.Brenda Davis expressed concern that wife had not been assessed yet, informed Brenda Davis shift change is at 0700 and RN hand-off can last between 45 minutes to 1 hour. Brenda Davis indicated would call back later.   During shift assessment at 2100, asked patient if she wanted to follow up with husband and sister Brenda Davis that had called. Asked if she wished to call them back with minimal response. Asked if patient tired and did not want to talk to family right now, patient responded "umm humm" indicating yes.  During assessment, patient engaged in minimal eye contact. Needed frequent redirection. Patient able to follow command to squeeze fingers and move feet upon command. Patient stated not in pain and intermittently reaching and stating "string beans". Aware of RN presence and would repeatedly state "ma'am" while appearing disoriented then would not respond further. At one point patient stated she could not breath, however, O2 at 100% with mild tachypnea. Vigorous scratching and tossing/turning in bed to reposition. Minimal verbal responses of "uh huh" or "umm huh" with no eye contact. Notified by NT that patient was pulling at lines (IV, PC monitor leads and peritoneal dialysis tubing that was actively running).   Able to administer lactulose with coaching.  Charge RN Ronalee Belts paged NP Baltazar Najjar on this RN behalf to notify of patient neuro status. This RN spoke with NP Baltazar Najjar at 1204 (informed no concerns about illicit drug use and pills found in bed yesterday were likely evening renal meds and  phoslo) Baltazar Najjar stated to call Rapid Response RN and will put in  orders for labs. Rapid Response RN Waunita Schooner called at 1215. Informed Waunita Schooner of patient status and assessment and no concerns about illicit drug use or narcotic use.   Paged NP Baltazar Najjar at [phone]; patient not able to take oral meds (new order for K+ and Lactulose) because not following commands. Orders placed for lactulose enema and IV K+.  Administration of lactulose enema required assistance of additional staff due to patient's disorientation and frequent tossing/turning behavior. Staff provided patient with emotional support during administration. IV infusions stopped/not started due to patient's disorientation, tossing/turning and pulling at lines. Patient care performed after administration. Charge RN Ronalee Belts notified tele to put patient on standby due to interventions and patient status.  Notified day RN of infusions not given, that pharmacy was notified via message for infusions to be re-timed and that patient tele/PC monitoring still on standby.

## 2018-04-19 NOTE — Progress Notes (Addendum)
[facility] Progress Note   Dialysis Orders: CCPD edw 116.5 6 exchnage 2.5 L dwell 1.5 hr dry day  Recent hgb 12 ipTH 866 Calcitriol 0.5/day, sensipar 60 qod, Aurixya 2 ac phoslo 3 ac  Assessment/Plan: 1. Fever -Tmax 101.4  hx recent staph warneri peritonitis tx Ancef IP - cell count drawn in ED - WBC erronenously reported as being done yesterday - was not done ; added on today to yesterday's fluid and WBC cell count only 4   - on empiric Vanc and maxipime - resp panel negative - on droplet precautions. STill low grade temps. Abd CT un revealing Doubt peritonitis w/ very low cell count, have d/w primary MD.  2. hypokalemia - K only up to 2.9 today after IV K - additional po 40 given this am - will given 40 more this afternoone 3. Elevated LFTs- AST 216  > 287 (was 24 03/23/18 at HD unit) other LFT ok- further eval per primary 4. ESRD -  CCPD- continue home orders - hold tonight - resume CCPD this evening  5. hypotension/volume  -  Use all 1.5s  6. Anemia  - hgb 12 > 11.3  - no ESA/Fe 7. Metabolic bone disease -  Resume  home ca acetate but hold sensipar for now due to marginal corrected a   8. Nutrition - alb 2.1 - renal carb mod   Myriam Jacobson, PA-C [facility] [phone] 04/19/2018,11:22 AM  LOS: 1 day   Pt seen, examined and agree w assess/plan as above with additions as indicated.  [facility] 04/19/2018, 5:31 PM     Subjective:   Feels some better. C/o  itching  Objective Vitals:   04/19/18 0006 04/19/18 0439 04/19/18 0931 04/19/18 0935  BP: 119/79 115/71 91/70 98/63   Pulse: 87 79    Resp: 10 17 18    Temp: 99.3 F (37.4 C) 99.6 F (37.6 C) 99.5 F (37.5 C)   TempSrc: Oral Oral Oral   SpO2: 96% 100% 100%   Weight:  117.4 kg    Height:       Physical Exam General: chronically ill appearing female Heart: RRR Lungs: dim BS poor expansion Abdomen: obese large pannus nontender Extremities: no sig LE  edema Dialysis Access:  PD cath   Additional Objective Labs: Basic Metabolic Panel: Recent Labs  Lab 04/18/18 1143 04/18/18 1435 04/19/18 0521  NA 131*  --  131*  K 2.6*  --  2.9*  CL 94*  --  92*  CO2 20*  --  20*  GLUCOSE 136*  --  89  BUN 35*  --  42*  CREATININE 15.69*  --  16.56*  CALCIUM 6.6*  --  6.6*  PHOS  --  4.5  --    Liver Function Tests: Recent Labs  Lab 04/18/18 1143 04/19/18 0521  AST 219* 287*  ALT 8 13  ALKPHOS 150* 129*  BILITOT 0.4 0.9  PROT 5.5* 5.3*  ALBUMIN 2.3* 2.1*   Recent Labs  Lab 04/18/18 1143  LIPASE 26   CBC: Recent Labs  Lab 04/18/18 1143 04/19/18 0521  WBC 4.7 3.7*  NEUTROABS 3.7 2.6  HGB 12.0 11.3*  HCT 37.1 35.2*  MCV 98.7 97.8  PLT 205 181   Blood Culture    Component Value Date/Time   SDES PERITONEAL CAVITY 04/18/2018 1347   SDES PERITONEAL CAVITY 04/18/2018 1347   SPECREQUEST NONE 04/18/2018 1347   SPECREQUEST NONE 04/18/2018 1347   CULT  04/18/2018 1347    NO GROWTH < 24 HOURS Performed at [facility], [place] [address] [place], [place] [address]    REPTSTATUS PENDING 04/18/2018 1347   REPTSTATUS 04/18/2018 FINAL 04/18/2018 1347    Cardiac Enzymes: No results for input(s): CKTOTAL, CKMB, CKMBINDEX, TROPONINI in the last 168 hours. CBG: Recent Labs  Lab 04/18/18 1115 04/18/18 1859 04/18/18 2112  GLUCAP 127* 95 98   Iron Studies: No results for input(s): IRON, TIBC, TRANSFERRIN, FERRITIN in the last 72 hours. Lab Results  Component Value Date   INR 1.0 11/26/2017   INR 1.00 05/06/2016   INR 1.13 01/03/2015   Studies/Results: Ct Abdomen Pelvis Wo Contrast  Result Date: 04/18/2018 CLINICAL DATA:  Fever a few days as recently completed treatment for staph peritonitis with last dose of Ancef given 04/13/2018. Fever this morning with vomiting and abdominal pain over the weekend. Patient with end-stage renal disease post dialysis this morning. EXAM: CT ABDOMEN AND PELVIS WITHOUT CONTRAST  TECHNIQUE: Multidetector CT imaging of the abdomen and pelvis was performed following the standard protocol without IV contrast. COMPARISON:  04/09/2018, 03/27/2018 and 11/11/2017 as well as 02/05/2015 FINDINGS: Lower chest: Minimal linear atelectasis/scarring left base. Small sliding hiatal hernia. Hepatobiliary: Previous cholecystectomy. Liver and biliary tree are within normal. Pancreas: Normal. Spleen: Normal. Adrenals/Urinary Tract: Adrenal glands are normal. Kidneys are somewhat small without hydronephrosis or nephrolithiasis. Ureters and bladder are normal. Stomach/Bowel: Surgical sutures over the stomach compatible previous bypass procedure. Small bowel is normal. Appendix is normal. Mild diverticulosis over the descending colon and sigmoid colon. Vascular/Lymphatic: Minimal calcified plaque over the abdominal aorta. No evidence of adenopathy. Reproductive: Normal. Other: Peritoneal dialysis catheter enters the lower abdomen just right of midline. Tip over the midline pelvis. Mild amount of free peritoneal fluid likely related to patient's peritoneal dialysis. Tiny amount of free peritoneal air also likely related to patient's peritoneal dialysis as this is unchanged. No focal inflammatory process. Musculoskeletal: Unchanged. IMPRESSION: Mild amount of free peritoneal fluid and tiny amount of free peritoneal air likely related patient's peritoneal dialysis. Dialysis catheter over the midline pelvis without significant change. Diverticulosis of the colon without active inflammation. Postsurgical changes as described. Electronically Signed   By: Marin Olp M.D.   On: 04/18/2018 15:50   X-ray Chest Pa And Lateral  Result Date: 04/19/2018 CLINICAL DATA:  Shortness of breath, nausea EXAM: CHEST - 2 VIEW COMPARISON:  04/18/2018 FINDINGS: Linear left base atelectasis or scarring. Heart is upper limits normal in size. Right lung clear. No effusions or acute bony abnormality. IMPRESSION: Left basilar  atelectasis or scarring. Electronically Signed   By: Rolm Baptise M.D.   On: 04/19/2018 07:47   Dg Chest Port 1 View  Result Date: 04/18/2018 CLINICAL DATA:  Fever EXAM: PORTABLE CHEST 1 VIEW COMPARISON:  03/16/2018 FINDINGS: The heart size and mediastinal contours are within normal limits. Both lungs are clear. The visualized skeletal structures are unremarkable. IMPRESSION: No active disease. Electronically Signed   By: Kathreen Devoid   On: 04/18/2018 11:31   Medications: . sodium chloride 500 mL (04/18/18 1326)  . ceFEPime (MAXIPIME) IV    . metronidazole 500 mg (04/19/18 0520)  . sodium chloride     . aspirin EC  81 mg Oral Daily  . calcium acetate  1,334 mg Oral TID WC  . cinacalcet  60 mg Oral Q supper  . ferric citrate  210 mg Oral TID WC  . folic acid  1 mg Oral Daily  . guaiFENesin  600 mg Oral BID  . heparin  5,000 Units Subcutaneous Q8H  . insulin aspart  0-5 Units Subcutaneous QHS  . insulin aspart  0-9 Units Subcutaneous TID WC  . ipratropium  0.5 mg Nebulization TID  . levalbuterol  0.63 mg Nebulization TID  . pantoprazole  40 mg Oral Daily  . potassium chloride  40 mEq Oral Once

## 2018-04-20 ENCOUNTER — Inpatient Hospital Stay (HOSPITAL_COMMUNITY): Payer: Medicare Other

## 2018-04-20 DIAGNOSIS — G9341 Metabolic encephalopathy: Secondary | ICD-10-CM

## 2018-04-20 LAB — CBC WITH DIFFERENTIAL/PLATELET
Abs Immature Granulocytes: 0.04 10*3/uL (ref 0.00–0.07)
Basophils Absolute: 0 10*3/uL (ref 0.0–0.1)
Basophils Relative: 0 %
Eosinophils Absolute: 0.3 10*3/uL (ref 0.0–0.5)
Eosinophils Relative: 6 %
HCT: 37.3 % (ref 36.0–46.0)
Hemoglobin: 12.8 g/dL (ref 12.0–15.0)
Immature Granulocytes: 1 %
Lymphocytes Relative: 21 %
Lymphs Abs: 0.9 10*3/uL (ref 0.7–4.0)
MCH: 33.1 pg (ref 26.0–34.0)
MCHC: 34.3 g/dL (ref 30.0–36.0)
MCV: 96.4 fL (ref 80.0–100.0)
Monocytes Absolute: 0.2 10*3/uL (ref 0.1–1.0)
Monocytes Relative: 6 %
Neutro Abs: 2.8 10*3/uL (ref 1.7–7.7)
Neutrophils Relative %: 66 %
Platelets: 184 10*3/uL (ref 150–400)
RBC: 3.87 MIL/uL (ref 3.87–5.11)
RDW: 14.6 % (ref 11.5–15.5)
WBC Morphology: INCREASED
WBC: 4.3 10*3/uL (ref 4.0–10.5)
nRBC: 0.5 % — ABNORMAL HIGH (ref 0.0–0.2)

## 2018-04-20 LAB — COMPREHENSIVE METABOLIC PANEL
ALT: 18 U/L (ref 0–44)
AST: 464 U/L — ABNORMAL HIGH (ref 15–41)
Albumin: 2.5 g/dL — ABNORMAL LOW (ref 3.5–5.0)
Alkaline Phosphatase: 145 U/L — ABNORMAL HIGH (ref 38–126)
Anion gap: 21 — ABNORMAL HIGH (ref 5–15)
BUN: 42 mg/dL — ABNORMAL HIGH (ref 6–20)
CO2: 18 mmol/L — ABNORMAL LOW (ref 22–32)
Calcium: 7.1 mg/dL — ABNORMAL LOW (ref 8.9–10.3)
Chloride: 91 mmol/L — ABNORMAL LOW (ref 98–111)
Creatinine, Ser: 17.02 mg/dL — ABNORMAL HIGH (ref 0.44–1.00)
GFR calc Af Amer: 2 mL/min — ABNORMAL LOW (ref >60)
GFR calc non Af Amer: 2 mL/min — ABNORMAL LOW (ref >60)
Glucose, Bld: 141 mg/dL — ABNORMAL HIGH (ref 70–99)
Potassium: 2.9 mmol/L — ABNORMAL LOW (ref 3.5–5.1)
Sodium: 130 mmol/L — ABNORMAL LOW (ref 135–145)
Total Bilirubin: 0.9 mg/dL (ref 0.3–1.2)
Total Protein: 5.8 g/dL — ABNORMAL LOW (ref 6.5–8.1)

## 2018-04-20 LAB — GASTROINTESTINAL PATHOGEN PANEL PCR
C. difficile Tox A/B, PCR: DETECTED — AB
Campylobacter, PCR: NOT DETECTED
Cryptosporidium, PCR: NOT DETECTED
E coli (ETEC) LT/ST PCR: NOT DETECTED
E coli (STEC) stx1/stx2, PCR: NOT DETECTED
E coli 0157, PCR: NOT DETECTED
Giardia lamblia, PCR: NOT DETECTED
Norovirus, PCR: NOT DETECTED
Rotavirus A, PCR: NOT DETECTED
Salmonella, PCR: NOT DETECTED
Shigella, PCR: NOT DETECTED

## 2018-04-20 LAB — BASIC METABOLIC PANEL
Anion gap: 23 — ABNORMAL HIGH (ref 5–15)
BUN: 44 mg/dL — ABNORMAL HIGH (ref 6–20)
CO2: 15 mmol/L — ABNORMAL LOW (ref 22–32)
Calcium: 7 mg/dL — ABNORMAL LOW (ref 8.9–10.3)
Chloride: 94 mmol/L — ABNORMAL LOW (ref 98–111)
Creatinine, Ser: 16.88 mg/dL — ABNORMAL HIGH (ref 0.44–1.00)
GFR calc Af Amer: 2 mL/min — ABNORMAL LOW (ref >60)
GFR calc non Af Amer: 2 mL/min — ABNORMAL LOW (ref >60)
Glucose, Bld: 141 mg/dL — ABNORMAL HIGH (ref 70–99)
Potassium: 2.8 mmol/L — ABNORMAL LOW (ref 3.5–5.1)
Sodium: 132 mmol/L — ABNORMAL LOW (ref 135–145)

## 2018-04-20 LAB — GLUCOSE, CAPILLARY
Glucose-Capillary: 178 mg/dL — ABNORMAL HIGH (ref 70–99)
Glucose-Capillary: 132 mg/dL — ABNORMAL HIGH (ref 70–99)
Glucose-Capillary: 90 mg/dL (ref 70–99)
Glucose-Capillary: 181 mg/dL — ABNORMAL HIGH (ref 70–99)

## 2018-04-20 LAB — PROCALCITONIN: Procalcitonin: 5.36 ng/mL

## 2018-04-20 LAB — AMMONIA: Ammonia: 43 umol/L — ABNORMAL HIGH (ref 9–35)

## 2018-04-20 LAB — MAGNESIUM
Magnesium: 1.7 mg/dL (ref 1.7–2.4)
Magnesium: 1.7 mg/dL (ref 1.7–2.4)

## 2018-04-20 MED ORDER — GENTAMICIN SULFATE 0.1 % EX CREA: 1.0000 "application " | TOPICAL_CREAM | Freq: Every day | CUTANEOUS | Status: DC

## 2018-04-20 MED ORDER — LACTULOSE 10 GM/15ML PO SOLN
10.0000 g | Freq: Once | ORAL | Status: DC
  Filled 2018-04-20: qty 15

## 2018-04-20 MED ORDER — LACTULOSE ENEMA
300.0000 mL | Freq: Two times a day (BID) | ORAL | Status: DC
  Filled 2018-04-20: qty 300

## 2018-04-20 MED ORDER — HEPARIN 1000 UNIT/ML FOR PERITONEAL DIALYSIS: 500.0000 [IU] | INTRAMUSCULAR | Status: DC | PRN

## 2018-04-20 MED ORDER — LACTULOSE 10 GM/15ML PO SOLN
20.0000 g | Freq: Two times a day (BID) | ORAL | Status: DC
  Filled 2018-04-20 (×2): qty 30

## 2018-04-20 MED ORDER — POTASSIUM CHLORIDE CRYS ER 20 MEQ PO TBCR
30.0000 meq | EXTENDED_RELEASE_TABLET | ORAL | Status: DC
  Filled 2018-04-20: qty 1

## 2018-04-20 MED ORDER — HEPARIN 1000 UNIT/ML FOR PERITONEAL DIALYSIS
INTRAPERITONEAL | Status: DC | PRN
  Filled 2018-04-20: qty 5000

## 2018-04-20 MED ORDER — GENTAMICIN SULFATE 0.1 % EX CREA
1.0000 "application " | TOPICAL_CREAM | Freq: Every day | CUTANEOUS | Status: DC
  Administered 2018-04-20 – 2018-04-26 (×7): 1 via TOPICAL

## 2018-04-20 MED ORDER — DELFLEX-LC/1.5% DEXTROSE 344 MOSM/L IP SOLN: INTRAPERITONEAL | Status: DC

## 2018-04-20 MED ORDER — LACTULOSE ENEMA
300.0000 mL | Freq: Once | ORAL | Status: AC
  Administered 2018-04-20: 300 mL via RECTAL
  Filled 2018-04-20: qty 300

## 2018-04-20 MED ORDER — LACTULOSE ENEMA
300.0000 mL | Freq: Two times a day (BID) | ORAL | Status: DC
  Filled 2018-04-20 (×2): qty 300

## 2018-04-20 MED ORDER — POTASSIUM CHLORIDE 10 MEQ/100ML IV SOLN
10.0000 meq | INTRAVENOUS | Status: AC
  Administered 2018-04-20 (×5): 10 meq via INTRAVENOUS
  Filled 2018-04-20 (×3): qty 100

## 2018-04-20 MED ORDER — POTASSIUM CHLORIDE 10 MEQ/100ML IV SOLN
10.0000 meq | INTRAVENOUS | Status: DC
  Administered 2018-04-20: 10 meq via INTRAVENOUS
  Filled 2018-04-20: qty 100

## 2018-04-20 MED ORDER — LORAZEPAM 2 MG/ML IJ SOLN
1.0000 mg | Freq: Once | INTRAMUSCULAR | Status: AC | PRN
  Administered 2018-04-20: 1 mg via INTRAVENOUS
  Filled 2018-04-20: qty 1

## 2018-04-20 MED ORDER — MAGNESIUM SULFATE IN D5W 1-5 GM/100ML-% IV SOLN
1.0000 g | Freq: Once | INTRAVENOUS | Status: AC
  Administered 2018-04-20: 1 g via INTRAVENOUS
  Filled 2018-04-20: qty 100

## 2018-04-20 MED ORDER — LACTULOSE 10 GM/15ML PO SOLN
10.0000 g | Freq: Two times a day (BID) | ORAL | Status: DC
  Administered 2018-04-20: 10 g via ORAL
  Filled 2018-04-20: qty 15

## 2018-04-20 NOTE — Procedures (Signed)
EEG Report  Clinical History: Altered mental status, twitching, and jerking. BUN 42, Cr 17, GFR 2, AST 464, NH3 43.   Technical Summary:  A 19 channel digital EEG recording was performed using the 10-20 international system of electrode placement.  Bipolar and Referential montages were used.  The total recording time was approx 20 minutes.  Findings:  There is no posterior dominant rhythm.  Background frequencies are in the 5-6 Hz range and symmetrical between both hemispheres.  There are intermittent high frequency triphasic waves symmetrically and synchronously in both hemispheres.  Episodic jerking and twitching are not associated with EEG changes.  There are no epileptiform discharges or electrographic seizures.   Sleep is not recorded.     Impression:  This is an abnormal EEG. There is moderate generalized slowing of brain activity and characteristic waveforms consistent with a metabolic encephalopathy, which may be due to renal, hepatic, or other disturbances.  Clinical correlation is recommended.  The patient is not in non-convulsive status epilepticus.    Rogue Jury, MS, MD

## 2018-04-20 NOTE — Progress Notes (Signed)
Technically difficult. 2 techs. EEG complete. Results pending.

## 2018-04-20 NOTE — Progress Notes (Signed)
I was notified by nursing staff for an additional assessment for Brenda Davis's change in behavior and altered mental status.  She was alert, answers only few intermittent questions voluntarily, able to identify objects.  She could move all four extremities with noxious stimuli equally and even respond verbally occasionally. CBG WNL. No focal deficits found.  Altered judgement witnessed when patient abruptly sits up in bed and thrashes around when lab tech was in the room to collect blood.  She was not cooperative unless stimulated and then she would briefly accommodate requests.  Notified Tylene Fantasia NP of assessment and call if any further assistance needed.

## 2018-04-20 NOTE — Progress Notes (Addendum)
[facility] Progress Note   Dialysis Orders: CCPD edw 116.5 6 exchnage 2.5 L dwell 1.5 hr dry day  Recent hgb 12 ipTH 866 Calcitriol 0.5/day, sensipar 60 qod, Aurixya 2 ac phoslo 3 ac  Assessment/Plan: 1. Fever -Tmax 9.5   hx recent staph warneri peritonitis tx Ancef IP - cell count drawn in ED - WBC erronenously reported as being done yesterday - was not done ; added on today to yesterday's fluid and WBC cell count only 4 so doubt perotinitis   - on empiric Vanc and maxipime - resp panel negative - on droplet precautions. STill low grade temps. Abd CT unrevealing Procalcitonin high 5.36 2. hypokalemia - K only up to 2.9 today supplemental K  - being redosed IV today - have liberalized diet to heart healthy carb mod to increase ^ K foods. 3. Elevated LFTs- AST 216  > 287 > 464 (was 24 03/23/18 at HD unit) other LFT ok-recent AST had been normal in early March - NH3 43 - doesn't explain her behavior today. lactulose enema given last pm per report 4. ESRD -  CCPD- continue home orders used all 1.5 last night - BP up today - labs drawn in the middle of PD in early am  so very inaccurate - will schedule to be drawn at 8 am. 5. BP/volume  -  Use all 1.5s - BP higher net UF 1 L last evening - use half 1.5 and 2.5 tonight or all 2.5s if SBP > 130  6. Anemia  - hgb 12 > 11.3 > 12.8   - no ESA/Fe 7. Metabolic bone disease -  Resume  home ca acetate but hold sensipar for now due to marginally low corrected  Ca 8. Nutrition - alb 2.1 - renal carb mod  9. Mental status - change  - work up per primary - for MRI head  Myriam Jacobson, PA-C [facility] [phone] 04/20/2018,11:36 AM  LOS: 2 days   Pt seen, examined and agree w A/P as above. Altered MS today and some jerking/ myoclonus. Creat is high in general for ESRD pt's on dialysis, will consider HD if not improving 24-48 hrs.  [facility] 04/20/2018, 3:34 PM    Subjective:   Nonverbal. No  eye contact. Will not answer questions.  Swung at dialysis RN when disconnecting PD.  Objective Vitals:   04/19/18 2054 04/20/18 0012 04/20/18 0323 04/20/18 0817  BP:  120/78 125/66 130/72  Pulse:  98 94 90  Resp:  (!) 24 17   Temp:  99.3 F (37.4 C) 98.2 F (36.8 C) 99.3 F (37.4 C)  TempSrc:  Oral Oral Oral  SpO2: 98% 100% 100% 100%  Weight:      Height:       Physical Exam General: chronically ill appearing female sitting up in bed but NAD  - non engaging - will not answer any questions.  Heart: RRR Lungs: dim BS poor expansion grossly clear Abdomen: obese large pannus nontender Extremities: no sig LE edema Dialysis Access:  PD cath   Additional Objective Labs: Basic Metabolic Panel: Recent Labs  Lab 04/18/18 1435 04/19/18 0521 04/20/18 0007 04/20/18 0025  NA  --  131* 132* 130*  K  --  2.9* 2.8* 2.9*  CL  --  92* 94* 91*  CO2  --  20* 15* 18*  GLUCOSE  --  89 141* 141*  BUN  --  42* 44* 42*  CREATININE  --  16.56* 16.88* 17.02*  CALCIUM  --  6.6* 7.0* 7.1*  PHOS 4.5  --   --   --    Liver Function Tests: Recent Labs  Lab 04/18/18 1143 04/19/18 0521 04/20/18 0025  AST 219* 287* 464*  ALT 8 13 18   ALKPHOS 150* 129* 145*  BILITOT 0.4 0.9 0.9  PROT 5.5* 5.3* 5.8*  ALBUMIN 2.3* 2.1* 2.5*   Recent Labs  Lab 04/18/18 1143  LIPASE 26   CBC: Recent Labs  Lab 04/18/18 1143 04/19/18 0521 04/20/18 0025  WBC 4.7 3.7* 4.3  NEUTROABS 3.7 2.6 2.8  HGB 12.0 11.3* 12.8  HCT 37.1 35.2* 37.3  MCV 98.7 97.8 96.4  PLT 205 181 184   Blood Culture    Component Value Date/Time   SDES PERITONEAL CAVITY 04/18/2018 1347   SDES PERITONEAL CAVITY 04/18/2018 1347   SPECREQUEST NONE 04/18/2018 1347   SPECREQUEST NONE 04/18/2018 1347   CULT  04/18/2018 1347    NO GROWTH 2 DAYS Performed at [facility], [place] [address], [place], [place] [address]    REPTSTATUS PENDING 04/18/2018 1347   REPTSTATUS 04/18/2018 FINAL 04/18/2018 1347    Cardiac  Enzymes: No results for input(s): CKTOTAL, CKMB, CKMBINDEX, TROPONINI in the last 168 hours. CBG: Recent Labs  Lab 04/18/18 2112 04/19/18 1454 04/19/18 1809 04/19/18 2039 04/20/18 0737  GLUCAP 98 100* 87 130* 178*   Iron Studies: No results for input(s): IRON, TIBC, TRANSFERRIN, FERRITIN in the last 72 hours. Lab Results  Component Value Date   INR 1.0 11/26/2017   INR 1.00 05/06/2016   INR 1.13 01/03/2015   Studies/Results: Ct Abdomen Pelvis Wo Contrast  Result Date: 04/18/2018 CLINICAL DATA:  Fever a few days as recently completed treatment for staph peritonitis with last dose of Ancef given 04/13/2018. Fever this morning with vomiting and abdominal pain over the weekend. Patient with end-stage renal disease post dialysis this morning. EXAM: CT ABDOMEN AND PELVIS WITHOUT CONTRAST TECHNIQUE: Multidetector CT imaging of the abdomen and pelvis was performed following the standard protocol without IV contrast. COMPARISON:  04/09/2018, 03/27/2018 and 11/11/2017 as well as 02/05/2015 FINDINGS: Lower chest: Minimal linear atelectasis/scarring left base. Small sliding hiatal hernia. Hepatobiliary: Previous cholecystectomy. Liver and biliary tree are within normal. Pancreas: Normal. Spleen: Normal. Adrenals/Urinary Tract: Adrenal glands are normal. Kidneys are somewhat small without hydronephrosis or nephrolithiasis. Ureters and bladder are normal. Stomach/Bowel: Surgical sutures over the stomach compatible previous bypass procedure. Small bowel is normal. Appendix is normal. Mild diverticulosis over the descending colon and sigmoid colon. Vascular/Lymphatic: Minimal calcified plaque over the abdominal aorta. No evidence of adenopathy. Reproductive: Normal. Other: Peritoneal dialysis catheter enters the lower abdomen just right of midline. Tip over the midline pelvis. Mild amount of free peritoneal fluid likely related to patient's peritoneal dialysis. Tiny amount of free peritoneal air also likely  related to patient's peritoneal dialysis as this is unchanged. No focal inflammatory process. Musculoskeletal: Unchanged. IMPRESSION: Mild amount of free peritoneal fluid and tiny amount of free peritoneal air likely related patient's peritoneal dialysis. Dialysis catheter over the midline pelvis without significant change. Diverticulosis of the colon without active inflammation. Postsurgical changes as described. Electronically Signed   By: Marin Olp M.D.   On: 04/18/2018 15:50   X-ray Chest Pa And Lateral  Result Date: 04/19/2018 CLINICAL DATA:  Shortness of breath, nausea EXAM: CHEST - 2 VIEW COMPARISON:  04/18/2018 FINDINGS: Linear left base atelectasis or scarring. Heart is upper limits normal in size. Right lung clear. No  effusions or acute bony abnormality. IMPRESSION: Left basilar atelectasis or scarring. Electronically Signed   By: Rolm Baptise M.D.   On: 04/19/2018 07:47   US Abdomen Limited Ruq  Result Date: 04/19/2018 CLINICAL DATA:  Abnormal LFTs. EXAM: ULTRASOUND ABDOMEN LIMITED RIGHT UPPER QUADRANT COMPARISON:  CT abdomen pelvis from yesterday. FINDINGS: Gallbladder: Surgically absent. Common bile duct: Diameter: 4 mm, normal. Liver: No focal lesion identified. Subtle nodular contour. Coarsened, heterogeneous parenchymal echogenicity. Portal vein is patent on color Doppler imaging with normal direction of blood flow towards the liver. IMPRESSION: 1. Subtle liver surface nodularity with coarsened, heterogeneous parenchymal echogenicity, suspicious for cirrhosis. Electronically Signed   By: Titus Dubin M.D.   On: 04/19/2018 11:40   Medications: . sodium chloride 500 mL (04/18/18 1326)  . ceFEPime (MAXIPIME) IV 1 g (04/19/18 1344)  . dialysis solution 1.5% low-MG/low-CA    . metronidazole 500 mg (04/20/18 1026)  . potassium chloride 10 mEq (04/20/18 1025)  . sodium chloride     . aspirin EC  81 mg Oral Daily  . calcium acetate  1,334 mg Oral TID WC  . ferric citrate  210 mg  Oral TID WC  . folic acid  1 mg Oral Daily  . gentamicin cream  1 application Topical Daily  . guaiFENesin  600 mg Oral BID  . heparin  5,000 Units Subcutaneous Q8H  . insulin aspart  0-5 Units Subcutaneous QHS  . insulin aspart  0-9 Units Subcutaneous TID WC  . ipratropium  0.5 mg Nebulization TID  . lactulose  300 mL Rectal BID  . levalbuterol  0.63 mg Nebulization TID  . pantoprazole  40 mg Oral Daily

## 2018-04-20 NOTE — Progress Notes (Signed)
Respiratory Note:  Treatment not administered. Therapist knocked on door and entered room, said hello to Brenda Davis and introduced self to patient.  Patient did not answer.  Therapist approached patient and again said hello and introduced self to patient. Patient did not speak, when therapist asked if she could administer a treatment, patient swatted therapist away. Therapist left room.  Treat,ment not administered

## 2018-04-20 NOTE — Progress Notes (Addendum)
PROGRESS NOTE        PATIENT DETAILS Name: Brenda Davis Age: 53 y.o. Sex: female Date of Birth: [birth year] Admit Date: 04/18/2018 Admitting Physician Kerney Elbe, DO TWS:FKCLE, Arvid Right, MD  Brief Narrative: Patient is a 53 y.o. female history of ESRD on PD, HTN, dyslipidemia, DM-2-recent history of staph ordinary peritonitis (completed IV Ancef on 3/25)-presenting to the hospital with abdominal pain and fever.  Initially there was some concern for peritonitis-and patient was started on empiric antimicrobial therapy, however peritoneal fluid analysis was not consistent with peritonitis.  Fever has resolved, but patient now confused.  See below for further details  Subjective: Confused-Per nursing staff-she started becoming more confused overnight-does not follow commands-blank stare mostly-did not order a single word.  Was sitting up in bed-and then laid down by herself.  Assessment/Plan: Sepsis: Etiology unclear-initially concern for peritonitis given abdominal pain-but peritoneal fluid analysis not consistent with peritonitis.  Blood cultures negative, CT abdomen without any other source of infection, chest x-ray without any infection.  Respiratory virus panel negative.  Is afebrile overnight-for now would continue with empiric antimicrobial therapy-and continue supportive care.  Acute metabolic encephalopathy: Etiology unclear-mild elevation in ammonia levels-may have undiagnosed liver cirrhosis (liver ultrasound suspicious for cirrhosis)-we will go ahead and check MRI brain/EEG.  Change oral lactulose to enema for now given mental status change.  Not sure if renal function can contribute-however patient is on PD.  Since fever curve is improved-not sure if sepsis is still playing a role here.  Hold narcotics-continue supportive care and close monitoring.  Abdominal pain: Unclear etiology-resolved-with elevation of AST-could have passed a  stone-although lipase/bilirubin and other LFTs are within normal limits.  Furthermore she is s/p cholecystectomy.  Mental status-difficult situation-but abdomen is soft and benign appearing.  Isolated elevation in AST: Unclear etiology-RUQ ultrasound without any obvious pathology-apart from possible cirrhosis.  Patient is s/p cholecystectomy.  Hypokalemia: Replete and recheck  ESRD on peritoneal dialysis: Per nephrology  CAD: No obvious anginal symptoms-continue aspirin  DM-2: CBG stable-continue with SSI  HTN: Blood pressure stable-continue to hold Coreg  Dyslipidemia: Continue to hold statin due to elevated AST  Morbid obesity-s/p gastric sleeve surgery  DVT Prophylaxis: Prophylactic Heparin   Code Status: Full code  Family Communication: Spouse over the phone  Disposition Plan: Remain inpatient  Antimicrobial agents: Anti-infectives (From admission, onward)   Start     Dose/Rate Route Frequency Ordered Stop   04/19/18 1400  ceFEPIme (MAXIPIME) 1 g in sodium chloride 0.9 % 100 mL IVPB     1 g 200 mL/hr over 30 Minutes Intravenous Every 24 hours 04/18/18 1205     04/18/18 2200  metroNIDAZOLE (FLAGYL) IVPB 500 mg     500 mg 100 mL/hr over 60 Minutes Intravenous Every 8 hours 04/18/18 1617     04/18/18 1630  metroNIDAZOLE (FLAGYL) IVPB 500 mg     500 mg 100 mL/hr over 60 Minutes Intravenous  Once 04/18/18 1615 04/18/18 1959   04/18/18 1615  ceFEPIme (MAXIPIME) 2 g in sodium chloride 0.9 % 100 mL IVPB     2 g 200 mL/hr over 30 Minutes Intravenous  Once 04/18/18 1615 04/18/18 2008   04/18/18 1215  vancomycin (VANCOCIN) 2,500 mg in sodium chloride 0.9 % 500 mL IVPB     2,500 mg 250 mL/hr over 120 Minutes Intravenous  Once 04/18/18 1203  04/18/18 1527   04/18/18 1130  ceFEPIme (MAXIPIME) 2 g in sodium chloride 0.9 % 100 mL IVPB  Status:  Discontinued     2 g 200 mL/hr over 30 Minutes Intravenous  Once 04/18/18 1120 04/18/18 1614   04/18/18 1130  metroNIDAZOLE (FLAGYL)  IVPB 500 mg  Status:  Discontinued     500 mg 100 mL/hr over 60 Minutes Intravenous  Once 04/18/18 1120 04/18/18 1614   04/18/18 1130  vancomycin (VANCOCIN) IVPB 1000 mg/200 mL premix  Status:  Discontinued     1,000 mg 200 mL/hr over 60 Minutes Intravenous  Once 04/18/18 1120 04/18/18 1126   04/18/18 1130  vancomycin (VANCOCIN) 2,000 mg in sodium chloride 0.9 % 500 mL IVPB  Status:  Discontinued     2,000 mg 250 mL/hr over 120 Minutes Intravenous  Once 04/18/18 1126 04/18/18 1203      Procedures: None  CONSULTS:  nephrology  Time spent: 35- minutes-Greater than 50% of this time was spent in counseling, explanation of diagnosis, planning of further management, and coordination of care.  MEDICATIONS: Scheduled Meds:  aspirin EC  81 mg Oral Daily   calcium acetate  1,334 mg Oral TID WC   ferric citrate  210 mg Oral TID WC   folic acid  1 mg Oral Daily   gentamicin cream  1 application Topical Daily   guaiFENesin  600 mg Oral BID   heparin  5,000 Units Subcutaneous Q8H   insulin aspart  0-5 Units Subcutaneous QHS   insulin aspart  0-9 Units Subcutaneous TID WC   ipratropium  0.5 mg Nebulization TID   lactulose  300 mL Rectal BID   levalbuterol  0.63 mg Nebulization TID   pantoprazole  40 mg Oral Daily   Continuous Infusions:  sodium chloride 500 mL (04/18/18 1326)   ceFEPime (MAXIPIME) IV 1 g (04/19/18 1344)   dialysis solution 1.5% low-MG/low-CA     metronidazole 500 mg (04/20/18 1026)   potassium chloride 10 mEq (04/20/18 1025)   sodium chloride     PRN Meds:.sodium chloride, acetaminophen, albuterol, camphor-menthol, dianeal solution for CAPD/CCPD with heparin, ondansetron **OR** ondansetron (ZOFRAN) IV, sodium chloride   PHYSICAL EXAM: Vital signs: Vitals:   04/19/18 2054 04/20/18 0012 04/20/18 0323 04/20/18 0817  BP:  120/78 125/66 130/72  Pulse:  98 94 90  Resp:  (!) 24 17   Temp:  99.3 F (37.4 C) 98.2 F (36.8 C) 99.3 F (37.4 C)    TempSrc:  Oral Oral Oral  SpO2: 98% 100% 100% 100%  Weight:      Height:       Filed Weights   04/18/18 1614 04/19/18 0439 04/19/18 1729  Weight: 116.2 kg 117.4 kg 117.8 kg   Body mass index is 43.22 kg/m.   General appearance :Awake-confused, blank stare-does not follow commands HEENT: Atraumatic and Normocephalic Neck: supple Resp:Good air entry bilaterally, no added sounds  CVS: S1 S2 regular GI: Bowel sounds present, Non tender and not distended with no gaurding, rigidity or rebound.No organomegaly Extremities: B/L Lower Ext shows no edema, both legs are warm to touch Neurology: difficult situation-appears non focal Musculoskeletal:No digital cyanosis Skin:No Rash, warm and dry Wounds:N/A  I have personally reviewed following labs and imaging studies  LABORATORY DATA: CBC: Recent Labs  Lab 04/18/18 1143 04/19/18 0521 04/20/18 0025  WBC 4.7 3.7* 4.3  NEUTROABS 3.7 2.6 2.8  HGB 12.0 11.3* 12.8  HCT 37.1 35.2* 37.3  MCV 98.7 97.8 96.4  PLT 205 181 184  Basic Metabolic Panel: Recent Labs  Lab 04/18/18 1143 04/18/18 1435 04/19/18 0521 04/20/18 0007 04/20/18 0025  NA 131*  --  131* 132* 130*  K 2.6*  --  2.9* 2.8* 2.9*  CL 94*  --  92* 94* 91*  CO2 20*  --  20* 15* 18*  GLUCOSE 136*  --  89 141* 141*  BUN 35*  --  42* 44* 42*  CREATININE 15.69*  --  16.56* 16.88* 17.02*  CALCIUM 6.6*  --  6.6* 7.0* 7.1*  MG  --  1.5*  --  1.7 1.7  PHOS  --  4.5  --   --   --     GFR: Estimated Creatinine Clearance: 4.9 mL/min (A) (by C-G formula based on SCr of 17.02 mg/dL (H)).  Liver Function Tests: Recent Labs  Lab 04/18/18 1143 04/19/18 0521 04/20/18 0025  AST 219* 287* 464*  ALT 8 13 18   ALKPHOS 150* 129* 145*  BILITOT 0.4 0.9 0.9  PROT 5.5* 5.3* 5.8*  ALBUMIN 2.3* 2.1* 2.5*   Recent Labs  Lab 04/18/18 1143  LIPASE 26   Recent Labs  Lab 04/19/18 1133 04/20/18 0007  AMMONIA 40* 43*    Coagulation Profile: No results for input(s): INR,  PROTIME in the last 168 hours.  Cardiac Enzymes: No results for input(s): CKTOTAL, CKMB, CKMBINDEX, TROPONINI in the last 168 hours.  BNP (last 3 results) No results for input(s): PROBNP in the last 8760 hours.  HbA1C: No results for input(s): HGBA1C in the last 72 hours.  CBG: Recent Labs  Lab 04/18/18 2112 04/19/18 1454 04/19/18 1809 04/19/18 2039 04/20/18 0737  GLUCAP 98 100* 87 130* 178*    Lipid Profile: No results for input(s): CHOL, HDL, LDLCALC, TRIG, CHOLHDL, LDLDIRECT in the last 72 hours.  Thyroid Function Tests: Recent Labs    04/18/18 1507  TSH 3.798    Anemia Panel: No results for input(s): VITAMINB12, FOLATE, FERRITIN, TIBC, IRON, RETICCTPCT in the last 72 hours.  Urine analysis:    Component Value Date/Time   COLORURINE COLORLESS (A) 04/18/2018 1900   APPEARANCEUR CLEAR 04/18/2018 1900   LABSPEC 1.005 04/18/2018 1900   PHURINE 8.0 04/18/2018 1900   GLUCOSEU >=500 (A) 04/18/2018 1900   GLUCOSEU >=1000 (A) 10/28/2017 0839   HGBUR SMALL (A) 04/18/2018 1900   HGBUR negative 09/17/2009 0000   BILIRUBINUR NEGATIVE 04/18/2018 1900   KETONESUR NEGATIVE 04/18/2018 1900   PROTEINUR 30 (A) 04/18/2018 1900   UROBILINOGEN 0.2 10/28/2017 0839   NITRITE NEGATIVE 04/18/2018 1900   LEUKOCYTESUR NEGATIVE 04/18/2018 1900    Sepsis Labs: Lactic Acid, Venous    Component Value Date/Time   LATICACIDVEN 1.4 04/18/2018 1821    MICROBIOLOGY: Recent Results (from the past 222 hour(s))  Helicobacter pylori special antigen     Status: None   Collection Time: 04/18/18  8:06 AM  Result Value Ref Range Status   MICRO NUMBER: [id number]  Final   SPECIMEN QUALITY Adequate  Final   SOURCE: STOOL  Final   STATUS: FINAL  Final   RESULT:   Final    Not Detected  Antimicrobials, proton pump inhibitors, and bismuth preparations inhibit H. pylori and ingestion up to two weeks prior to testing may cause false negative results. If clinically indicated the test should be  repeated on a new specimen  obtained two weeks after discontinuing treatment.   Blood Culture (routine x 2)     Status: None (Preliminary result)   Collection Time: 04/18/18 11:32 AM  Result Value Ref Range Status   Specimen Description BLOOD RIGHT FOREARM  Final   Special Requests   Final    BOTTLES DRAWN AEROBIC AND ANAEROBIC Blood Culture adequate volume   Culture   Final    NO GROWTH 2 DAYS Performed at [facility], [address] [place], [place] [address]    Report Status PENDING  Incomplete  Blood Culture (routine x 2)     Status: None (Preliminary result)   Collection Time: 04/18/18 11:40 AM  Result Value Ref Range Status   Specimen Description BLOOD RIGHT ANTECUBITAL  Final   Special Requests   Final    BOTTLES DRAWN AEROBIC AND ANAEROBIC Blood Culture adequate volume   Culture   Final    NO GROWTH 2 DAYS Performed at [facility], [place] [address], [place], [place] [address]    Report Status PENDING  Incomplete  Culture, body fluid-bottle     Status: None (Preliminary result)   Collection Time: 04/18/18  1:47 PM  Result Value Ref Range Status   Specimen Description PERITONEAL CAVITY  Final   Special Requests NONE  Final   Culture   Final    NO GROWTH 2 DAYS Performed at [facility], [address] [place], [place] [address]    Report Status PENDING  Incomplete  Gram stain     Status: None   Collection Time: 04/18/18  1:47 PM  Result Value Ref Range Status   Specimen Description PERITONEAL CAVITY  Final   Special Requests NONE  Final   Gram Stain   Final    WBC PRESENT,BOTH PMN AND MONONUCLEAR NO ORGANISMS SEEN CYTOSPIN SMEAR Performed at [facility], [address], [place], [place] [address]    Report Status 04/18/2018 FINAL  Final  MRSA PCR Screening     Status: None   Collection Time: 04/18/18  2:37 PM  Result Value Ref Range Status   MRSA by PCR NEGATIVE NEGATIVE Final    Comment:        The GeneXpert MRSA Assay  (FDA approved for NASAL specimens only), is one component of a comprehensive MRSA colonization surveillance program. It is not intended to diagnose MRSA infection nor to guide or monitor treatment for MRSA infections. Performed at [facility], [place] [address] [place], [place] [address]   Respiratory Panel by PCR     Status: None   Collection Time: 04/19/18 11:05 AM  Result Value Ref Range Status   Adenovirus NOT DETECTED NOT DETECTED Final   Coronavirus 229E NOT DETECTED NOT DETECTED Final    Comment: (NOTE) The Coronavirus on the Respiratory Panel, DOES NOT test for the novel  Coronavirus (2019 nCoV)    Coronavirus HKU1 NOT DETECTED NOT DETECTED Final   Coronavirus NL63 NOT DETECTED NOT DETECTED Final   Coronavirus OC43 NOT DETECTED NOT DETECTED Final   Metapneumovirus NOT DETECTED NOT DETECTED Final   Rhinovirus / Enterovirus NOT DETECTED NOT DETECTED Final   Influenza A NOT DETECTED NOT DETECTED Final   Influenza B NOT DETECTED NOT DETECTED Final   Parainfluenza Virus 1 NOT DETECTED NOT DETECTED Final   Parainfluenza Virus 2 NOT DETECTED NOT DETECTED Final   Parainfluenza Virus 3 NOT DETECTED NOT DETECTED Final   Parainfluenza Virus 4 NOT DETECTED NOT DETECTED Final   Respiratory Syncytial Virus NOT DETECTED NOT DETECTED Final   Bordetella pertussis NOT DETECTED NOT DETECTED Final   Chlamydophila pneumoniae NOT DETECTED NOT DETECTED Final  Mycoplasma pneumoniae NOT DETECTED NOT DETECTED Final    Comment: Performed at [facility], [place] [address] [place], [place] [address]    RADIOLOGY STUDIES/RESULTS: Ct Abdomen Pelvis Wo Contrast  Result Date: 04/18/2018 CLINICAL DATA:  Fever a few days as recently completed treatment for staph peritonitis with last dose of Ancef given 04/13/2018. Fever this morning with vomiting and abdominal pain over the weekend. Patient with end-stage renal disease post dialysis this morning. EXAM: CT ABDOMEN AND PELVIS WITHOUT CONTRAST  TECHNIQUE: Multidetector CT imaging of the abdomen and pelvis was performed following the standard protocol without IV contrast. COMPARISON:  04/09/2018, 03/27/2018 and 11/11/2017 as well as 02/05/2015 FINDINGS: Lower chest: Minimal linear atelectasis/scarring left base. Small sliding hiatal hernia. Hepatobiliary: Previous cholecystectomy. Liver and biliary tree are within normal. Pancreas: Normal. Spleen: Normal. Adrenals/Urinary Tract: Adrenal glands are normal. Kidneys are somewhat small without hydronephrosis or nephrolithiasis. Ureters and bladder are normal. Stomach/Bowel: Surgical sutures over the stomach compatible previous bypass procedure. Small bowel is normal. Appendix is normal. Mild diverticulosis over the descending colon and sigmoid colon. Vascular/Lymphatic: Minimal calcified plaque over the abdominal aorta. No evidence of adenopathy. Reproductive: Normal. Other: Peritoneal dialysis catheter enters the lower abdomen just right of midline. Tip over the midline pelvis. Mild amount of free peritoneal fluid likely related to patient's peritoneal dialysis. Tiny amount of free peritoneal air also likely related to patient's peritoneal dialysis as this is unchanged. No focal inflammatory process. Musculoskeletal: Unchanged. IMPRESSION: Mild amount of free peritoneal fluid and tiny amount of free peritoneal air likely related patient's peritoneal dialysis. Dialysis catheter over the midline pelvis without significant change. Diverticulosis of the colon without active inflammation. Postsurgical changes as described. Electronically Signed   By: Marin Olp M.D.   On: 04/18/2018 15:50   X-ray Chest Pa And Lateral  Result Date: 04/19/2018 CLINICAL DATA:  Shortness of breath, nausea EXAM: CHEST - 2 VIEW COMPARISON:  04/18/2018 FINDINGS: Linear left base atelectasis or scarring. Heart is upper limits normal in size. Right lung clear. No effusions or acute bony abnormality. IMPRESSION: Left basilar  atelectasis or scarring. Electronically Signed   By: Rolm Baptise M.D.   On: 04/19/2018 07:47   Ct Abdomen Pelvis W Contrast  Result Date: 04/09/2018 CLINICAL DATA:  Nausea and vomiting and abdominal pain EXAM: CT ABDOMEN AND PELVIS WITH CONTRAST TECHNIQUE: Multidetector CT imaging of the abdomen and pelvis was performed using the standard protocol following bolus administration of intravenous contrast. CONTRAST:  137m OMNIPAQUE IOHEXOL 300 MG/ML  SOLN COMPARISON:  03/27/2018 FINDINGS: Lower chest: No acute abnormality. Hepatobiliary: Diffuse fatty infiltration of the liver is noted. No focal mass is seen. The gallbladder has been surgically removed. Pancreas: Unremarkable. No pancreatic ductal dilatation or surrounding inflammatory changes. Spleen: Normal in size without focal abnormality. Adrenals/Urinary Tract: Adrenal glands are within normal limits. Renal vascular calcifications are noted. No obstructive changes are seen. The ureters are within normal limits bilaterally. The bladder is decompressed. Stomach/Bowel: Scattered diverticular changes noted within the colon. No evidence of diverticulitis is seen. The appendix is within normal limits. No obstructive or inflammatory changes of the small bowel are noted. Changes of prior sleeve gastrectomy are seen. Small sliding-type hiatal hernia is noted. Vascular/Lymphatic: Aortic atherosclerosis. No enlarged abdominal or pelvic lymph nodes. Reproductive: Uterus and bilateral adnexa are unremarkable. Other: Peritoneal dialysis catheter is noted deep within the pelvis. Small amount of free fluid is noted within the pelvis associated with the catheter. Additionally there are a few foci of air  identified in the upper abdomen adjacent to the spleen and stomach. These are likely related to the peritoneal dialysis catheter. By history the patient is receiving treatment for peritonitis. Musculoskeletal: No acute bony abnormality is seen. IMPRESSION: Minimal free air  and free fluid within the abdomen likely related to the patient's known peritoneal dialysis catheter. No definitive evidence to suggest perforation are seen. Diverticulosis without diverticulitis. Fatty liver. Electronically Signed   By: Inez Catalina M.D.   On: 04/09/2018 16:50   Ct Abdomen Pelvis W Contrast  Result Date: 03/27/2018 CLINICAL DATA:  Lower abdominal pain starting Friday. Patient does dialysis every day at home. Prior gastric sleeve, peritoneal catheter placement and cholecystectomy. EXAM: CT ABDOMEN AND PELVIS WITH CONTRAST TECHNIQUE: Multidetector CT imaging of the abdomen and pelvis was performed using the standard protocol following bolus administration of intravenous contrast. CONTRAST:  151m OMNIPAQUE IOHEXOL 300 MG/ML  SOLN COMPARISON:  11/11/2017 CT FINDINGS: Lower chest: Top-normal heart size with coronary arteriosclerosis. No pericardial effusion or thickening. Clear lung bases with left basilar atelectasis. Hepatobiliary: Steatosis of the liver. More focal fatty infiltration adjacent to the falciform ligament. Surface nodularity of the liver compatible morphologic changes of cirrhosis. No space-occupying mass of the liver is seen. The patient is status post cholecystectomy. Pancreas: No pancreatic mass or ductal dilatation.  No inflammation. Spleen: Normal Adrenals/Urinary Tract: Normal bilateral adrenal glands. Mild bilateral renal atrophy and cortical thinning. No obstructive uropathy, mass or nephrolithiasis. The urinary bladder is unremarkable for the degree of distention. It is slightly thick-walled but this is likely due to underdistention. Cystitis is believed less likely. Stomach/Bowel: Gastric sleeve procedure without complicating features. Small hiatal hernia is noted. Normal small bowel rotation without obstruction or inflammation. Sigmoid diverticulosis without acute diverticulitis. The appendix is normal caliber with probable small amount of retained contrast noted.  Vascular/Lymphatic: Aortoiliac atherosclerosis. No adenopathy by CT size criteria. Reproductive: Uterus and bilateral adnexa are unremarkable. Other: Dialysis catheter is seen coiled within the mid pelvis. Musculoskeletal: No acute nor suspicious osseous abnormality. IMPRESSION: 1. Steatosis of the liver with slight morphologic changes of cirrhosis given surface nodularity of the liver. No space-occupying mass is seen. 2. Status post cholecystectomy. 3. Gastric sleeve procedure without complicating features. 4. Sigmoid diverticulosis without acute diverticulitis. 5. Percutaneous peritoneal dialysis catheter is seen coiled pelvis without complicating features. Electronically Signed   By: DAshley RoyaltyM.D.   On: 03/27/2018 20:55   Dg Chest Port 1 View  Result Date: 04/18/2018 CLINICAL DATA:  Fever EXAM: PORTABLE CHEST 1 VIEW COMPARISON:  03/16/2018 FINDINGS: The heart size and mediastinal contours are within normal limits. Both lungs are clear. The visualized skeletal structures are unremarkable. IMPRESSION: No active disease. Electronically Signed   By: HKathreen Devoid  On: 04/18/2018 11:31   UKoreaAbdomen Limited Ruq  Result Date: 04/19/2018 CLINICAL DATA:  Abnormal LFTs. EXAM: ULTRASOUND ABDOMEN LIMITED RIGHT UPPER QUADRANT COMPARISON:  CT abdomen pelvis from yesterday. FINDINGS: Gallbladder: Surgically absent. Common bile duct: Diameter: 4 mm, normal. Liver: No focal lesion identified. Subtle nodular contour. Coarsened, heterogeneous parenchymal echogenicity. Portal vein is patent on color Doppler imaging with normal direction of blood flow towards the liver. IMPRESSION: 1. Subtle liver surface nodularity with coarsened, heterogeneous parenchymal echogenicity, suspicious for cirrhosis. Electronically Signed   By: WTitus DubinM.D.   On: 04/19/2018 11:40     LOS: 2 days   SOren Binet MD  [facility]  If 7PM-7AM, please contact night-coverage  Please page via [email or web address]  Go to  [email or web address] and use [facility]'s universal password to access. If you do not have the password, please contact the hospital operator.  Locate the [facility] provider you are looking for under [facility] and page to a number that you can be directly reached. If you still have difficulty reaching the provider, please page the [facility] (Director on Call) for the Hospitalists listed on amion for assistance.  04/20/2018, 10:53 AM

## 2018-04-21 ENCOUNTER — Inpatient Hospital Stay (HOSPITAL_COMMUNITY): Payer: Medicare Other

## 2018-04-21 DIAGNOSIS — G9340 Encephalopathy, unspecified: Secondary | ICD-10-CM

## 2018-04-21 LAB — CBC
HCT: 34.4 % — ABNORMAL LOW (ref 36.0–46.0)
Hemoglobin: 12.2 g/dL (ref 12.0–15.0)
MCH: 33.2 pg (ref 26.0–34.0)
MCHC: 35.5 g/dL (ref 30.0–36.0)
MCV: 93.5 fL (ref 80.0–100.0)
Platelets: 217 10*3/uL (ref 150–400)
RBC: 3.68 MIL/uL — ABNORMAL LOW (ref 3.87–5.11)
RDW: 14.3 % (ref 11.5–15.5)
WBC: 9.5 10*3/uL (ref 4.0–10.5)
nRBC: 0 % (ref 0.0–0.2)

## 2018-04-21 LAB — COMPREHENSIVE METABOLIC PANEL
ALT: 30 U/L (ref 0–44)
AST: 533 U/L — ABNORMAL HIGH (ref 15–41)
Albumin: 2.3 g/dL — ABNORMAL LOW (ref 3.5–5.0)
Alkaline Phosphatase: 129 U/L — ABNORMAL HIGH (ref 38–126)
Anion gap: 16 — ABNORMAL HIGH (ref 5–15)
BUN: 39 mg/dL — ABNORMAL HIGH (ref 6–20)
CO2: 21 mmol/L — ABNORMAL LOW (ref 22–32)
Calcium: 7.4 mg/dL — ABNORMAL LOW (ref 8.9–10.3)
Chloride: 99 mmol/L (ref 98–111)
Creatinine, Ser: 16.33 mg/dL — ABNORMAL HIGH (ref 0.44–1.00)
GFR calc Af Amer: 3 mL/min — ABNORMAL LOW (ref >60)
GFR calc non Af Amer: 2 mL/min — ABNORMAL LOW (ref >60)
Glucose, Bld: 170 mg/dL — ABNORMAL HIGH (ref 70–99)
Potassium: 2.7 mmol/L — CL (ref 3.5–5.1)
Sodium: 136 mmol/L (ref 135–145)
Total Bilirubin: 0.9 mg/dL (ref 0.3–1.2)
Total Protein: 5.6 g/dL — ABNORMAL LOW (ref 6.5–8.1)

## 2018-04-21 LAB — HEPATITIS PANEL, ACUTE
HCV Ab: <0.1 s/co ratio (ref 0.0–0.9)
Hep A IgM: NEGATIVE
Hep B C IgM: NEGATIVE
Hepatitis B Surface Ag: NEGATIVE

## 2018-04-21 LAB — GLUCOSE, CAPILLARY
Glucose-Capillary: 166 mg/dL — ABNORMAL HIGH (ref 70–99)
Glucose-Capillary: 106 mg/dL — ABNORMAL HIGH (ref 70–99)
Glucose-Capillary: 97 mg/dL (ref 70–99)
Glucose-Capillary: 101 mg/dL — ABNORMAL HIGH (ref 70–99)
Glucose-Capillary: 124 mg/dL — ABNORMAL HIGH (ref 70–99)

## 2018-04-21 LAB — PROTIME-INR
INR: 1.3 — ABNORMAL HIGH (ref 0.8–1.2)
Prothrombin Time: 16 seconds — ABNORMAL HIGH (ref 11.4–15.2)

## 2018-04-21 LAB — AMMONIA: Ammonia: 48 umol/L — ABNORMAL HIGH (ref 9–35)

## 2018-04-21 LAB — VANCOMYCIN, RANDOM: Vancomycin Rm: 33

## 2018-04-21 MED ORDER — LACTULOSE 10 GM/15ML PO SOLN
20.0000 g | Freq: Three times a day (TID) | ORAL | Status: DC
  Administered 2018-04-21 – 2018-04-26 (×5): 20 g via ORAL
  Filled 2018-04-21 (×9): qty 30

## 2018-04-21 MED ORDER — THIAMINE HCL 100 MG/ML IJ SOLN: 500.0000 mg | Freq: Every day | INTRAVENOUS | Status: DC

## 2018-04-21 MED ORDER — ALBUMIN HUMAN 25 % IV SOLN
INTRAVENOUS | Status: AC
  Administered 2018-04-21: 25 g via INTRAVENOUS
  Filled 2018-04-21: qty 100

## 2018-04-21 MED ORDER — POTASSIUM CHLORIDE 10 MEQ/100ML IV SOLN
10.0000 meq | INTRAVENOUS | Status: AC
  Administered 2018-04-21: 10 meq via INTRAVENOUS
  Filled 2018-04-21: qty 100

## 2018-04-21 MED ORDER — POTASSIUM CHLORIDE 10 MEQ/100ML IV SOLN
10.0000 meq | INTRAVENOUS | Status: AC
  Administered 2018-04-21 (×3): 10 meq via INTRAVENOUS
  Filled 2018-04-21 (×3): qty 100

## 2018-04-21 MED ORDER — VANCOMYCIN VARIABLE DOSE PER UNSTABLE RENAL FUNCTION (PHARMACIST DOSING): Status: DC

## 2018-04-21 MED ORDER — MIDODRINE HCL 5 MG PO TABS
10.0000 mg | ORAL_TABLET | Freq: Two times a day (BID) | ORAL | Status: DC
  Administered 2018-04-23 – 2018-04-26 (×7): 10 mg via ORAL
  Filled 2018-04-21 (×5): qty 2

## 2018-04-21 MED ORDER — ALBUMIN HUMAN 25 % IV SOLN
25.0000 g | INTRAVENOUS | Status: DC | PRN
  Administered 2018-04-21: 21:00:00 25 g via INTRAVENOUS

## 2018-04-21 MED ORDER — THIAMINE HCL 100 MG/ML IJ SOLN
100.0000 mg | Freq: Every day | INTRAMUSCULAR | Status: DC
  Administered 2018-04-21 – 2018-04-22 (×2): 100 mg via INTRAVENOUS
  Filled 2018-04-21 (×2): qty 2

## 2018-04-21 MED ORDER — SODIUM CHLORIDE 0.9 % IV BOLUS
250.0000 mL | Freq: Once | INTRAVENOUS | Status: AC
  Administered 2018-04-21: 250 mL via INTRAVENOUS

## 2018-04-21 MED ORDER — CHLORHEXIDINE GLUCONATE CLOTH 2 % EX PADS
6.0000 | MEDICATED_PAD | Freq: Every day | CUTANEOUS | Status: DC
  Administered 2018-04-21 – 2018-04-24 (×3): 6 via TOPICAL

## 2018-04-21 MED ORDER — LORAZEPAM 2 MG/ML IJ SOLN
INTRAMUSCULAR | Status: AC
  Administered 2018-04-21: 2 mg
  Filled 2018-04-21: qty 1

## 2018-04-21 NOTE — Consult Note (Addendum)
[facility] Consult: 11:22 AM 04/21/2018  LOS: 3 days    Referring Provider: Oren Binet  Primary Care Physician:  [name], MD Primary Gastroenterologist:  Dr. Ardis Hughs    Reason for Consultation: Elevated AST.  Abdominal pain.   HPI: Brenda Davis is a 53 y.o. female.  PMH ESRD due to diabetes.  Previous hemodialysis patient but currently on peritoneal dialysis.  Anemia of chronic kidney disease, on B12 and iron supplements.  Hyperetension.  Morbid obesity, s/p gastric sleeve bariatric procedure 2014.  Status post cholecystectomy.   Nodular liver/cirrhosis diagnosed in fall 2019.  Following a battery of tests, etiology attributed to NASH. Non-STEMI 02/24/2018.  02/25/18 cardiac cath: moderate three-vessel disease with extensive calcification in all 3 major coronary arteries, but no stenting was warranted.  Patient was continued on 81 mg aspirin, atorvastatin 80 mg, and Coreg     03/2012 UGI preop for bariatric surgery.  Normal study.   08/2012 UGI series for N/V, abd pain post bariatric surgery.  No significant narrowing as the contrast column passed adequately from the esophagus to the stomach and proximal small bowel. 09/2012 EGD.  For persistent vomiting 3 months following sleeve gastrectomy.  Dr. Ardis Hughs noted small hiatal hernia, and normal gastric sleeve.  Pylorus slightly narrowed narrowed but not strictured.  Pylorus was balloon dilated.  Fully inflated 20 mm balloon pulled up through the sleeve into the GE junction without resistance.    03/27/2018 CT  Abdomen/pelvis showed nodular, cirrhotic appearing liver, no liver lesions suspicious for HCC.  Prior cholecystectomy.  Prior, uncomplicated gastric sleeve anatomy. Sigmoid diverticulosis.  Peritoneal dialysis catheter coiled in the pelvis without  complications.   Repeat CT scan 04/09/2018 with CM, for nausea, vomiting, abdominal pain.  Showed minimal free air and free fluid in the abdomen likely due to her peritoneal dialysis.  No definite perforation.  Fatty liver.  Diverticulosis without diverticulitis. Grew staph waneri from PD fluid 03/28/18.  Treated with infusions of intra-abdominal Ancef, QOD for 4 weeks, completed 04/13/18.   Renal rechecked the fluid and it showed resolution of peritonitis    04/15/2018 telemedicine visit with Dr. Ardis Hughs.  + early satiety. + stomach pain.  Since her symptoms began when she was initially diagnosed with peritonitis he felt that the symptoms were likely due to peritonitis.  He planned stool testing for H. pylori and asked that she begin empiric OTC omeprazole daily, had not started this or been able to drop the stool specimen off yet.  Home meds included PRN Zofran, PRN oxycodone, TID Aurixia (iron), 81 ASA.  She was admitted 04/18/2018 with stomach and generalized abdominal pain, non-bloody vomiting, fever to 101, weakness/fatigue, began over the weekend.  Generalized pruritus.   Fevers to 102.3, mild lactate elevation, hypotension in the ED.  Normal WBCs.  Recurrent peritonitis suspected.  Empiric abx initiated after collecting peritoneal fluid specimen but fluid analysis shows resolution of peritonitis with fluid WBCs 4. No PNA on CXR,   During the admission patient's AST has gone from 219 >> 533.  Was 34 on 04/09/18.  Alk phos from 150 >> 129, was 123 on 04/09/18.   T bili, ALT and Lipase normal.   Ammonia is 48.   04/18/18 CT Ab/pelvis w/o CM: Mild free peritoneal fluid, tiny free peritoneal air likely related patient's peritoneal dialysis.  Dialysis catheter over the midline pelvis without significant change. Diverticulosis of the colon without active inflammation.  She developed encephalopathy with mutism, clenching of mouth and fists in the last 24 hours and was started on lactulose enemas but confusion  persists.  RN reports stool last night after enema.  EEG consistent with metabolic encephalopathy.  MRI of her head is ordered.  She received IV Ativan 20940 in order to calm her and allow for pndg MRI.  Renal MDs plan start of HD to address likely inadequate PD (though hypo, not hyperkalemic) and encephalopathy.  They have been attempting to correct her hypokalemia with IV potassium.     Past Medical History:  Diagnosis Date  . Anemia   . Chronic kidney disease    STAGE 3 CHRONIC KIDNEY DISEASE SECONDARY TO DIABETIC GLOMERULOSCLEROSIS AND UNCONTROLLED HYPERTENSION - PER OFFICE NOTES DR. Fl[facility].  Marland Kitchen ESRD on dialysis [facility])    "MWF; [address] (05/06/2016)  . Gout   . HCAP (healthcare-associated pneumonia) 05/06/2016  . History of blood transfusion    "related to surgery"  . Hyperlipidemia   . Hypertension   . Morbid obesity ([place])   . Pain    LEFT SHOULDER PAIN - WAS SEEN AT AN URGENT CARE - GIVEN SLING FOR COMFORT AND TOLD ROM AS TOLERATED.  Marland Kitchen Palpitations 09/24/2016  . Type II diabetes mellitus ([place])    "gastric sleeve OR corrected this" (05/06/2016)    Past Surgical History:  Procedure Laterality Date  . AV FISTULA PLACEMENT Left 01/03/2015   Procedure: BRACHIAL CEPHALIC ARTERIOVENOUS  FISTULA CREATION LEFT ARM;  Surgeon: Angelia Mould, MD;  Location: [place];  Service: Vascular;  Laterality: Left;  . CARPAL TUNNEL RELEASE Right   . [place]  . CHOLECYSTECTOMY  09/14/2017  . ESOPHAGOGASTRODUODENOSCOPY N/A 09/04/2012   Procedure: ESOPHAGOGASTRODUODENOSCOPY (EGD);  Surgeon: Shann Medal, MD;  Location: [facility];  Service: General;  Laterality: N/A;  PF  . ESOPHAGOGASTRODUODENOSCOPY (EGD) WITH ESOPHAGEAL DILATION N/A 09/29/2012   Procedure: ESOPHAGOGASTRODUODENOSCOPY (EGD) WITH ESOPHAGEAL DILATION;  Surgeon: Milus Banister, MD;  Location: [facility];  Service: Endoscopy;  Laterality: N/A;  . INSERTION OF DIALYSIS CATHETER N/A  01/03/2015   Procedure: INSERTION OF DIALYSIS CATHETER RIGHT INTERNAL JUGULAR;  Surgeon: Angelia Mould, MD;  Location: [place];  Service: Vascular;  Laterality: N/A;  . LAPAROSCOPIC GASTRIC SLEEVE RESECTION N/A 07/19/2012   Procedure: LAPAROSCOPIC SLEEVE GASTRECTOMY with EGD;  Surgeon: Madilyn Hook, DO;  Location: [facility];  Service: General;  Laterality: N/A;  laparoscopic sleeve gastrectomy with EGD  . LEFT HEART CATH AND CORONARY ANGIOGRAPHY N/A 02/25/2018   Procedure: LEFT HEART CATH AND CORONARY ANGIOGRAPHY;  Surgeon: Leonie Man, MD;  Location: [facility] [facility];  Service: Cardiovascular;  Laterality: N/A;  . PERIPHERAL VASCULAR CATHETERIZATION Left 05/23/2015   Procedure: Nolon Stalls;  Surgeon: Conrad Paramount-Long Meadow, MD;  Location: [facility];  Service: Cardiovascular;  Laterality: Left;  upper aRM  . PERIPHERAL VASCULAR CATHETERIZATION Left 05/23/2015   Procedure: Peripheral Vascular Balloon Angioplasty;  Surgeon: Conrad Verdigris, MD;  Location: [place] [facility];  Service: Cardiovascular;  Laterality: Left;  av fistula  . TUBAL LIGATION  1993  . UPPER  GI ENDOSCOPY N/A 07/19/2012   Procedure: UPPER GI ENDOSCOPY;  Surgeon: Madilyn Hook, DO;  Location: [facility];  Service: General;  Laterality: N/A;    Prior to Admission medications   Medication Sig Start Date End Date Taking? Authorizing Provider  acetaminophen (TYLENOL) 325 MG tablet Take 650 mg by mouth every 6 (six) hours as needed for mild pain.    [provider]  aspirin 81 MG EC tablet Take 81 mg by mouth daily. 02/26/18   [provider]  aspirin EC 81 MG EC tablet Take 1 tablet (81 mg total) by mouth daily. 02/26/18   Norval Morton, MD  atorvastatin (LIPITOR) 80 MG tablet Take 1 tablet (80 mg total) by mouth daily at 6 PM. 04/12/18   [name], MD  AURYXIA 1 GM 210 MG(Fe) tablet Take 210 mg by mouth 3 (three) times daily with meals.  04/07/17   [provider]  calcium acetate (PHOSLO) 667 MG capsule  Take 2 capsules (1,334 mg total) by mouth 3 (three) times daily with meals. 01/07/15   Eugenie Filler, MD  carvedilol (COREG) 3.125 MG tablet Take 1 tablet (3.125 mg total) by mouth 2 (two) times daily with a meal. 02/26/18   Norval Morton, MD  cinacalcet (SENSIPAR) 30 MG tablet Take 2 tablets (60 mg total) by mouth daily with supper. 02/26/18   Norval Morton, MD  Cyanocobalamin (B-12 PO) Take 1 tablet by mouth daily.    [provider]  folic acid (FOLVITE) 1 MG tablet Take 1 tablet (1 mg total) by mouth daily. 03/08/18   [name], MD  gentamicin cream (GARAMYCIN) 0.1 % Apply 1 application topically See admin instructions. Apply to exit site daily as directed. 04/26/17   [provider]  ondansetron (ZOFRAN ODT) 8 MG disintegrating tablet Take 1 tablet (8 mg total) by mouth every 8 (eight) hours as needed for nausea or vomiting. 04/09/18   Jola Schmidt, MD  oxyCODONE (OXY IR/ROXICODONE) 5 MG immediate release tablet Take 1 tablet (5 mg total) by mouth every 6 (six) hours as needed for severe pain. 04/12/18   [name], MD    Scheduled Meds: . aspirin EC  81 mg Oral Daily  . calcium acetate  1,334 mg Oral TID WC  . Chlorhexidine Gluconate Cloth  6 each Topical Q0600  . ferric citrate  210 mg Oral TID WC  . folic acid  1 mg Oral Daily  . gentamicin cream  1 application Topical Daily  . guaiFENesin  600 mg Oral BID  . heparin  5,000 Units Subcutaneous Q8H  . insulin aspart  0-5 Units Subcutaneous QHS  . insulin aspart  0-9 Units Subcutaneous TID WC  . lactulose  20 g Oral BID  . pantoprazole  40 mg Oral Daily   Infusions: . sodium chloride 500 mL (04/18/18 1326)  . ceFEPime (MAXIPIME) IV 1 g (04/20/18 1320)  . dialysis solution 1.5% low-MG/low-CA    . metronidazole 500 mg (04/21/18 0817)  . potassium chloride    . sodium chloride     PRN Meds: sodium chloride, acetaminophen, albuterol, camphor-menthol, ondansetron **OR** ondansetron (ZOFRAN) IV, sodium  chloride   Allergies as of 04/18/2018 - Review Complete 04/18/2018  Allergen Reaction Noted  . Amlodipine Swelling 12/07/2011  . Clonidine derivatives Swelling 03/22/2012  . Doxycycline Nausea And Vomiting 03/27/2018  . Welchol [colesevelam hcl] Nausea Only 06/28/2012    Family History  Problem Relation Age of Onset  . Hypertension  Mother   . Mitral valve prolapse Mother   . Diabetes Father   . Arthritis Other   . Hyperlipidemia Other   . Cancer Neg Hx   . Heart disease Neg Hx   . Kidney disease Neg Hx   . Stroke Neg Hx     Social History   Socioeconomic History  . Marital status: Married    Spouse name: Not on file  . Number of children: 3  . Years of education: Not on file  . Highest education level: Not on file  Occupational History  . Occupation: Research scientist (physical sciences): [employer]  Social Needs  . Financial resource strain: Not on file  . Food insecurity:    Worry: Not on file    Inability: Not on file  . Transportation needs:    Medical: Not on file    Non-medical: Not on file  Tobacco Use  . Smoking status: Former Smoker    Packs/day: 1.00    Years: 16.00    Pack years: 16.00    Types: Cigarettes    Last attempt to quit: 2009    Years since quitting: 11.2  . Smokeless tobacco: Never Used  Substance and Sexual Activity  . Alcohol use: Never    Frequency: Never  . Drug use: No  . Sexual activity: Yes    Birth control/protection: Post-menopausal  Lifestyle  . Physical activity:    Days per week: Not on file    Minutes per session: Not on file  . Stress: Not on file  Relationships  . Social connections:    Talks on phone: Not on file    Gets together: Not on file    Attends religious service: Not on file    Active member of club or organization: Not on file    Attends meetings of clubs or organizations: Not on file    Relationship status: Not on file  . Intimate partner violence:    Fear of current or ex partner: Not on file    Emotionally  abused: Not on file    Physically abused: Not on file    Forced sexual activity: Not on file  Other Topics Concern  . Not on file  Social History Narrative  . Not on file    REVIEW OF SYSTEMS: Unable to obtain ROS from the patient. Constitutional:  Per HPI ENT:  No nose bleeds Pulm: No reports of shortness of breath or cough. CV:  No reports of palpitations, chest pain, LE edema.  GU: No urine output recorded on flow sheets. GI: See HPI.  No reports of bloody or melenic stool.  No reports of bloody or coffee-ground emesis. Heme: No reports of unusual bleeding. Transfusions:  2 PRBCs in 2016.   Neuro:  No headaches, no peripheral tingling or numbness.  No seizures.   Derm:  + pruritus.    Endocrine:  No sweats or chills.  No polyuria or dysuria Immunization:  Reviewed.  Current flu shot.   Travel:  None beyond local counties in last few months.    PHYSICAL EXAM: Vital signs in last 24 hours: Vitals:   04/21/18 0448 04/21/18 0948  BP: (!) 110/59 (!) 103/57  Pulse: 81 80  Resp: 18 17  Temp: 98.2 F (36.8 C) 98.4 F (36.9 C)  SpO2: 100% 99%   Wt Readings from Last 3 Encounters:  04/21/18 114.1 kg  04/12/18 114.8 kg  04/09/18 112.5 kg   General: Encephalopathic.  Not responding to my  voice.  Not following commands.  Obese.  Looks ill. Head: No facial asymmetry or swelling.  No signs of head trauma. Eyes: Conjunctiva pink.  No scleral icterus. Ears: Not able to assess hearing. Nose: No discharge. Mouth: Clenching her jaw shot, did not attempt to open her mouth. Neck: No JVD, no masses, no thyromegaly. Lungs: Clear bilaterally.  Good breath sounds.  Mouth breathing, slightly labored.  No cough. Heart: RRR.  No MRG.  S1, S2 present. Abdomen: Obese.  Soft.  No obvious tenderness.  No masses, HSM, hernias, bruits.  PD catheter on right, unsoiled gauze covers this.   Rectal: Deferred Musc/Skeltl: No obvious joint deformities, redness, swelling. Extremities: Feet are warm.   No edema. Neurologic: Mute.  Not following commands.  Clenching her jaw and her fists.  With attempt to on clench her fist, noted rhythmic tremor consistent with asterixis. Skin: Linear scars consistent with scratching on her lateral right thigh.   Psych: Somnolent.  Nonverbal.  Intake/Output from previous day: 04/01 0701 - 04/02 0700 In: 78295 [P.O.:220; IV Piggyback:100] Out: 601 [Stool:601] Intake/Output this shift: No intake/output data recorded.  LAB RESULTS: Recent Labs    04/19/18 0521 04/20/18 0025 04/21/18 0822  WBC 3.7* 4.3 9.5  HGB 11.3* 12.8 12.2  HCT 35.2* 37.3 34.4*  PLT 181 184 217   BMET Lab Results  Component Value Date   NA 136 04/21/2018   NA 130 (L) 04/20/2018   NA 132 (L) 04/20/2018   K 2.7 (LL) 04/21/2018   K 2.9 (L) 04/20/2018   K 2.8 (L) 04/20/2018   CL 99 04/21/2018   CL 91 (L) 04/20/2018   CL 94 (L) 04/20/2018   CO2 21 (L) 04/21/2018   CO2 18 (L) 04/20/2018   CO2 15 (L) 04/20/2018   GLUCOSE 170 (H) 04/21/2018   GLUCOSE 141 (H) 04/20/2018   GLUCOSE 141 (H) 04/20/2018   BUN 39 (H) 04/21/2018   BUN 42 (H) 04/20/2018   BUN 44 (H) 04/20/2018   CREATININE 16.33 (H) 04/21/2018   CREATININE 17.02 (H) 04/20/2018   CREATININE 16.88 (H) 04/20/2018   CALCIUM 7.4 (L) 04/21/2018   CALCIUM 7.1 (L) 04/20/2018   CALCIUM 7.0 (L) 04/20/2018   LFT Recent Labs    04/19/18 0521 04/20/18 0025 04/21/18 0822  PROT 5.3* 5.8* 5.6*  ALBUMIN 2.1* 2.5* 2.3*  AST 287* 464* 533*  ALT 13 18 30   ALKPHOS 129* 145* 129*  BILITOT 0.9 0.9 0.9   PT/INR Lab Results  Component Value Date   INR 1.3 (H) 04/21/2018   INR 1.0 11/26/2017   INR 1.00 05/06/2016   Hepatitis Panel Recent Labs    04/20/18 0025  HEPBSAG Negative  HCVAB <0.1  HEPAIGM Negative  HEPBIGM Negative   C-Diff No components found for: CDIFF Lipase     Component Value Date/Time   LIPASE 26 04/18/2018 1143    Drugs of Abuse  No results found for: LABOPIA, COCAINSCRNUR, LABBENZ,  AMPHETMU, THCU, LABBARB   RADIOLOGY STUDIES: US Abdomen Limited Ruq  Result Date: 04/19/2018 CLINICAL DATA:  Abnormal LFTs. EXAM: ULTRASOUND ABDOMEN LIMITED RIGHT UPPER QUADRANT COMPARISON:  CT abdomen pelvis from yesterday. FINDINGS: Gallbladder: Surgically absent. Common bile duct: Diameter: 4 mm, normal. Liver: No focal lesion identified. Subtle nodular contour. Coarsened, heterogeneous parenchymal echogenicity. Portal vein is patent on color Doppler imaging with normal direction of blood flow towards the liver. IMPRESSION: 1. Subtle liver surface nodularity with coarsened, heterogeneous parenchymal echogenicity, suspicious for cirrhosis. Electronically Signed   By: Gwyndolyn Saxon  Marzella Schlein M.D.   On: 04/19/2018 11:40     IMPRESSION:   *   Fever, abd pain with non-bloody emesis in PD pt with resolved staph peritonitis.  New elevation of AST and alk phos.  Previous cholecystectomy and sleeve gastrectomy.  Unrevealing CTs on 03/27/18, 04/09/18.  Coming off multiple abx,.  Vanc and Flagyl continue, Garamycin, Maxipime continue  *     ESRD patient on peritoneal dialysis.  Completed 4 weeks of abx infusion  On 3/25.   Starting HD today.    *   Acute encephalopathy.  Suspected due to azotemia from inadequate PD.  HD starts today.    *    NASH cirrhosis.  New elevation of AST, alk phos.    *     Anemia of chronic kidney disease.  On oral iron and B12 supplements as outpatient.  *     Morbid obesity.  Gastric sleeve procedure 2014.  *   Non STEMI 02/2018.  Cath 02/25/2018 with moderate three-vessel disease, extensive calcification in major arteries,  no stent warranted.  On low dose ASA.     PLAN:     *  Continue empiric rectal lactulose, PPI.  Follow LFTs and ammonia.       Azucena Freed  04/21/2018, 11:22 AM Phone [phone]  ________________________________________________________________________  Velora Heckler GI MD note:  I personally examined the patient, reviewed the data and agree with the  assessment and plan described above.  Something has clearly changed since I spoke with her on the phone last week.  This afternoon she is unable to speak, moves her mouth as if to try however.  Possibly this is due to hepatic encephalopathy but her ammonia is only slightly elevated and she has never had HE previously and so I think that would be lower on the list of possible causes.  Rectal lactulose certainly won't hurt though.  She has had 3 CT scans in the past 4 weeks (abd/pelvis), none of which suggest any GI pathology. I think changing to hemodialysis makes sense.   Owens Loffler, MD [facility] Pager [phone]

## 2018-04-21 NOTE — Progress Notes (Signed)
CRITICAL VALUE ALERT  Critical Value: Potassium 2.7  Date & Time Notied: 04/21/2018 10:20  Provider Notified: Dr. Sloan Leiter  Orders Received/Actions taken: Awaiting orders will continue to monitor.

## 2018-04-21 NOTE — Progress Notes (Signed)
Called Hemodialysis and notified them pt is back from MRI and ready to got to dialysis when they have a spot available.

## 2018-04-21 NOTE — Progress Notes (Addendum)
Addendum: Vancomycin discontinued per Dr. Nena Alexander. .04/21/2018 1:10 PM   Pharmacy Antibiotic Note  Brenda Davis is a 53 y.o. female admitted on 04/18/2018 with fever.  Pharmacy was  Consulted on 04/18/18  for vancomycin and cefepime dosing. Pt is on peritoneal dialysis ([facility]).  WBC 9.5,  Afebrile with Tm 98.4.  Patient received appropriate IV loading dose of vancomycin 2500 mg IV on 3/30.  Expected need for redose in 3-7 days. Today 4/2 Vancomycin random level = 33 mcg/ml , thus no vanc dose needed yet.  Plan for patient on CCPD is to recheck level in 2-3 days & redose when level </=15-25 mcg/ml.  Today ,  Renal plans HD session today x1 , no PD tonight- Still no Vancomycin needed post HD today 2/2 level this AM is >25 mcg/ml.  Will follow renal plans on 4/3.   Plan: No redose of IV Vancomycin needed today.  Will redose when needed based on levels and dialysis plans.  Continue Cefepime 2gm IV x 1 then 1gm IV Q24H F/u renal plans, C&S, clinical status and vanc levels as needed  Height: 5\' 5"  (165.1 cm) Weight: 251 lb 8.7 oz (114.1 kg) IBW/kg (Calculated) : 57  Temp (24hrs), Avg:98.1 F (36.7 C), Min:97.7 F (36.5 C), Max:98.4 F (36.9 C)  Recent Labs  Lab 04/18/18 1143 04/18/18 1435 04/18/18 1821 04/19/18 0521 04/20/18 0007 04/20/18 0025 04/21/18 0822  WBC 4.7  --   --  3.7*  --  4.3 9.5  CREATININE 15.69*  --   --  16.56* 16.88* 17.02* 16.33*  LATICACIDVEN 2.2* 0.8 1.4  --   --   --   --   VANCORANDOM  --   --   --   --   --   --  33    Estimated Creatinine Clearance: 5 mL/min (A) (by C-G formula based on SCr of 16.33 mg/dL (H)).    Allergies  Allergen Reactions  . Amlodipine Swelling  . Clonidine Derivatives Swelling    Limbs swell  . Doxycycline Nausea And Vomiting    "I threw up for 3 hours"  . Welchol [Colesevelam Hcl] Nausea Only    Antimicrobials this admission: Vanc 3/30>> Cefepime 3/30>> Flagyl x 1 3/30  Dose adjustments this  admission: N/A  Microbiology results: 3/31 Resp pane pcr: negative 3/30  Bcx:  NGTD 3/30 Peritoneal fluid cx: NGTD 3/30 MRSA PCR: neg 3/30 Urine Cx: not found 9/02 Helicobacter pylori antigent: not detected   Thank you for allowing pharmacy to be a part of this patient's care.  Nicole Cella, RPh Clinical Pharmacist Pager: [phone] Please check AMION for all [place] phone numbers After 10:00 PM, call Normal [phone] 04/21/2018 12:31 PM   Addendum: Vancomycin discontinued per Dr. Nena Alexander. .04/21/2018 1:10 PM  Nicole Cella, RPh Clinical Pharmacist 04/21/2018 1:11 PM

## 2018-04-21 NOTE — Progress Notes (Addendum)
Ativan 2mg  was given per doctors order before pt went down for MRI

## 2018-04-21 NOTE — Progress Notes (Signed)
  Unable to contact patient family for consent prior to start of HD tx, provider attestation sought for tx initiation and primary nurse called to try contacting pts family for consent. MD notified also of blood pressure concerns, orders given. 1 hour into treatment, pts primary nurse called to inform me that pt husband refused to give consent because he had questions. MD notified, called and spoke to pts husband who still refused to give concent for HD. MD ordered to terminate tx. Tx terminated with no adverse events. Tx well tolerated. Pt is stable, no bleeding, sob noted. Vitals stable.  Report called to pts primary nurse.

## 2018-04-21 NOTE — Progress Notes (Addendum)
PROGRESS NOTE        PATIENT DETAILS Name: Brenda Davis Age: 53 y.o. Sex: female Date of Birth: [birth year] Admit Date: 04/18/2018 Admitting Physician Kerney Elbe, DO HBZ:JIRCV, Arvid Right, MD  Brief Narrative: Patient is a 53 y.o. female history of ESRD on PD, HTN, dyslipidemia, DM-2-recent history of staph ordinary peritonitis (completed IV Ancef on 3/25),liver cirrhoses 2/2 NASH-presenting to the hospital with abdominal pain and fever.  Initially there was some concern for peritonitis-and patient was started on empiric antimicrobial therapy, however peritoneal fluid analysis was not consistent with peritonitis.  Fever has resolved, but patient now confused.  See below for further details  Subjective: Remains confused-does not utter a word-but would withdraw to pain.  Moving all 4 extremities.  Awaiting MRI brain.  Per nursing staff-numerous BMs overnight with lactulose enema.  Assessment/Plan: Sepsis: Sepsis physiology has improved.  Etiology unclear-initially concern for peritonitis given abdominal pain-but peritoneal fluid analysis not consistent with peritonitis.  Blood cultures negative, CT abdomen without any other source of infection, chest x-ray without any infection.  Respiratory virus panel negative.  Has now been afebrile for almost 48 hours-do not think she requires IV Flagyl-hence will discontinue.  Continue with cefepime pending improvement in clinical course/culture data.  Acute metabolic encephalopathy: Etiology unclear-but suspicion that she may be uremic due to under dialysis with PD.  Other suspicion is that she may have hepatic encephalopathy-with probable underlying liver cirrhosis and worsening AST levels.  EEG negative for seizures.  Change lactulose enema to 3 times daily dosing today, spoke with nephrology-they are planning to convert to hemodialysis to see if mental status will improve.  MRI brain pending-we will be  challenging given encephalopathy.  Continue to hold narcotics.  If mental status does not improve with hemodialysis-may need to consult neurology at some point.Recent Thiamine level in Feb 2020 was normal. TSH normal. Will check Vitamin B12, RPR,HIV with am labs  Abdominal pain: Unclear etiology-seems to have resolved-abdomen is soft-but difficult to evaluate given encephalopathy.  She does have a isolated elevation in her AST-given abdominal pain-she could have passed a bile duct stone-however lipase/bilirubin levels and other LFTs are within normal limits.  RUQ ultrasound did not show any obvious abnormalities.  CT of the abdomen also did not show acute abnormalities.   Isolated elevation in AST: Unclear etiology-RUQ ultrasound without any obvious pathology-apart from possible cirrhosis.  Patient is s/p cholecystectomy.  Since AST continues to [facility] consulted gastroenterology today.  Acute hepatitis serology negative.  Hypokalemia: Continue to replete and recheck  ESRD on peritoneal dialysis: See above regarding plans to transition to hemodialysis-Per nephrology  History of liver cirrhosis secondary to NASH: See above regarding concerns about hepatic encephalopathy  CAD: No obvious anginal symptoms-continue aspirin  DM-2: CBG stable-continue with SSI  HTN: Blood pressure stable-continue to hold Coreg  Dyslipidemia: Continue to hold statin due to elevated AST  Morbid obesity-s/p gastric sleeve surgery  DVT Prophylaxis: Prophylactic Heparin   Code Status: Full code  Family Communication: Spouse over the phone on 4/2-updated regarding plans for MRI brain, and started HD  Disposition Plan: Remain inpatient  Antimicrobial agents: Anti-infectives (From admission, onward)   Start     Dose/Rate Route Frequency Ordered Stop   04/19/18 1400  ceFEPIme (MAXIPIME) 1 g in sodium chloride 0.9 % 100 mL IVPB     1 g 200  mL/hr over 30 Minutes Intravenous Every 24 hours 04/18/18 1205      04/18/18 2200  metroNIDAZOLE (FLAGYL) IVPB 500 mg     500 mg 100 mL/hr over 60 Minutes Intravenous Every 8 hours 04/18/18 1617     04/18/18 1630  metroNIDAZOLE (FLAGYL) IVPB 500 mg     500 mg 100 mL/hr over 60 Minutes Intravenous  Once 04/18/18 1615 04/18/18 1959   04/18/18 1615  ceFEPIme (MAXIPIME) 2 g in sodium chloride 0.9 % 100 mL IVPB     2 g 200 mL/hr over 30 Minutes Intravenous  Once 04/18/18 1615 04/18/18 2008   04/18/18 1215  vancomycin (VANCOCIN) 2,500 mg in sodium chloride 0.9 % 500 mL IVPB     2,500 mg 250 mL/hr over 120 Minutes Intravenous  Once 04/18/18 1203 04/18/18 1527   04/18/18 1130  ceFEPIme (MAXIPIME) 2 g in sodium chloride 0.9 % 100 mL IVPB  Status:  Discontinued     2 g 200 mL/hr over 30 Minutes Intravenous  Once 04/18/18 1120 04/18/18 1614   04/18/18 1130  metroNIDAZOLE (FLAGYL) IVPB 500 mg  Status:  Discontinued     500 mg 100 mL/hr over 60 Minutes Intravenous  Once 04/18/18 1120 04/18/18 1614   04/18/18 1130  vancomycin (VANCOCIN) IVPB 1000 mg/200 mL premix  Status:  Discontinued     1,000 mg 200 mL/hr over 60 Minutes Intravenous  Once 04/18/18 1120 04/18/18 1126   04/18/18 1130  vancomycin (VANCOCIN) 2,000 mg in sodium chloride 0.9 % 500 mL IVPB  Status:  Discontinued     2,000 mg 250 mL/hr over 120 Minutes Intravenous  Once 04/18/18 1126 04/18/18 1203      Procedures: None  CONSULTS:  nephrology  Time spent: 35- minutes-Greater than 50% of this time was spent in counseling, explanation of diagnosis, planning of further management, and coordination of care.  MEDICATIONS: Scheduled Meds:  aspirin EC  81 mg Oral Daily   calcium acetate  1,334 mg Oral TID WC   Chlorhexidine Gluconate Cloth  6 each Topical Q0600   ferric citrate  210 mg Oral TID WC   folic acid  1 mg Oral Daily   gentamicin cream  1 application Topical Daily   guaiFENesin  600 mg Oral BID   heparin  5,000 Units Subcutaneous Q8H   insulin aspart  0-5 Units Subcutaneous QHS     insulin aspart  0-9 Units Subcutaneous TID WC   lactulose  20 g Oral BID   pantoprazole  40 mg Oral Daily   Continuous Infusions:  sodium chloride 500 mL (04/18/18 1326)   ceFEPime (MAXIPIME) IV 1 g (04/20/18 1320)   dialysis solution 1.5% low-MG/low-CA     metronidazole 500 mg (04/21/18 0817)   potassium chloride     sodium chloride     PRN Meds:.sodium chloride, acetaminophen, albuterol, camphor-menthol, ondansetron **OR** ondansetron (ZOFRAN) IV, sodium chloride   PHYSICAL EXAM: Vital signs: Vitals:   04/20/18 2022 04/21/18 0041 04/21/18 0448 04/21/18 0948  BP: 100/63 106/66 (!) 110/59 (!) 103/57  Pulse: 80 80 81 80  Resp: 18 18 18 17   Temp: 97.7 F (36.5 C) 98.3 F (36.8 C) 98.2 F (36.8 C) 98.4 F (36.9 C)  TempSrc: Oral Oral Oral Oral  SpO2: 98% 100% 100% 99%  Weight:    114.1 kg  Height:       Filed Weights   04/19/18 1729 04/20/18 1700 04/21/18 0948  Weight: 117.8 kg 115.3 kg 114.1 kg   Body mass index  is 41.86 kg/m.   General appearance: Confused-moves all 4 extremities-looks at me but does not speak Eyes:no scleral icterus. HEENT: Atraumatic and Normocephalic Neck: supple Resp:Good air entry bilaterally,no rales or rhonchi CVS: S1 S2 regular GI: Bowel sounds present, Non tender and not distended with no gaurding, rigidity or rebound. Extremities: B/L Lower Ext shows no edema, both legs are warm to touch Neurology: Appears to move all 4 extremities Musculoskeletal:No digital cyanosis Skin:No Rash, warm and dry Wounds:N/A  I have personally reviewed following labs and imaging studies  LABORATORY DATA: CBC: Recent Labs  Lab 04/18/18 1143 04/19/18 0521 04/20/18 0025 04/21/18 0822  WBC 4.7 3.7* 4.3 9.5  NEUTROABS 3.7 2.6 2.8  --   HGB 12.0 11.3* 12.8 12.2  HCT 37.1 35.2* 37.3 34.4*  MCV 98.7 97.8 96.4 93.5  PLT 205 181 184 256    Basic Metabolic Panel: Recent Labs  Lab 04/18/18 1143 04/18/18 1435 04/19/18 0521 04/20/18 0007  04/20/18 0025 04/21/18 0822  NA 131*  --  131* 132* 130* 136  K 2.6*  --  2.9* 2.8* 2.9* 2.7*  CL 94*  --  92* 94* 91* 99  CO2 20*  --  20* 15* 18* 21*  GLUCOSE 136*  --  89 141* 141* 170*  BUN 35*  --  42* 44* 42* 39*  CREATININE 15.69*  --  16.56* 16.88* 17.02* 16.33*  CALCIUM 6.6*  --  6.6* 7.0* 7.1* 7.4*  MG  --  1.5*  --  1.7 1.7  --   PHOS  --  4.5  --   --   --   --     GFR: Estimated Creatinine Clearance: 5 mL/min (A) (by C-G formula based on SCr of 16.33 mg/dL (H)).  Liver Function Tests: Recent Labs  Lab 04/18/18 1143 04/19/18 0521 04/20/18 0025 04/21/18 0822  AST 219* 287* 464* 533*  ALT 8 13 18 30   ALKPHOS 150* 129* 145* 129*  BILITOT 0.4 0.9 0.9 0.9  PROT 5.5* 5.3* 5.8* 5.6*  ALBUMIN 2.3* 2.1* 2.5* 2.3*   Recent Labs  Lab 04/18/18 1143  LIPASE 26   Recent Labs  Lab 04/19/18 1133 04/20/18 0007 04/21/18 0512  AMMONIA 40* 43* 48*    Coagulation Profile: Recent Labs  Lab 04/21/18 0512  INR 1.3*    Cardiac Enzymes: No results for input(s): CKTOTAL, CKMB, CKMBINDEX, TROPONINI in the last 168 hours.  BNP (last 3 results) No results for input(s): PROBNP in the last 8760 hours.  HbA1C: No results for input(s): HGBA1C in the last 72 hours.  CBG: Recent Labs  Lab 04/20/18 0737 04/20/18 1210 04/20/18 1621 04/20/18 2133 04/21/18 0751  GLUCAP 178* 132* 90 181* 166*    Lipid Profile: No results for input(s): CHOL, HDL, LDLCALC, TRIG, CHOLHDL, LDLDIRECT in the last 72 hours.  Thyroid Function Tests: Recent Labs    04/18/18 1507  TSH 3.798    Anemia Panel: No results for input(s): VITAMINB12, FOLATE, FERRITIN, TIBC, IRON, RETICCTPCT in the last 72 hours.  Urine analysis:    Component Value Date/Time   COLORURINE COLORLESS (A) 04/18/2018 1900   APPEARANCEUR CLEAR 04/18/2018 1900   LABSPEC 1.005 04/18/2018 1900   PHURINE 8.0 04/18/2018 1900   GLUCOSEU >=500 (A) 04/18/2018 1900   GLUCOSEU >=1000 (A) 10/28/2017 0839   HGBUR SMALL  (A) 04/18/2018 1900   HGBUR negative 09/17/2009 0000   BILIRUBINUR NEGATIVE 04/18/2018 1900   KETONESUR NEGATIVE 04/18/2018 1900   PROTEINUR 30 (A) 04/18/2018 1900   UROBILINOGEN  0.2 10/28/2017 0839   NITRITE NEGATIVE 04/18/2018 1900   LEUKOCYTESUR NEGATIVE 04/18/2018 1900    Sepsis Labs: Lactic Acid, Venous    Component Value Date/Time   LATICACIDVEN 1.4 04/18/2018 1821    MICROBIOLOGY: Recent Results (from the past 030 hour(s))  Helicobacter pylori special antigen     Status: None   Collection Time: 04/18/18  8:06 AM  Result Value Ref Range Status   MICRO NUMBER: [id number]  Final   SPECIMEN QUALITY Adequate  Final   SOURCE: STOOL  Final   STATUS: FINAL  Final   RESULT:   Final    Not Detected  Antimicrobials, proton pump inhibitors, and bismuth preparations inhibit H. pylori and ingestion up to two weeks prior to testing may cause false negative results. If clinically indicated the test should be repeated on a new specimen  obtained two weeks after discontinuing treatment.   Blood Culture (routine x 2)     Status: None (Preliminary result)   Collection Time: 04/18/18 11:32 AM  Result Value Ref Range Status   Specimen Description BLOOD RIGHT FOREARM  Final   Special Requests   Final    BOTTLES DRAWN AEROBIC AND ANAEROBIC Blood Culture adequate volume   Culture   Final    NO GROWTH 2 DAYS Performed at [facility], [address], [place], [place] [address]    Report Status PENDING  Incomplete  Blood Culture (routine x 2)     Status: None (Preliminary result)   Collection Time: 04/18/18 11:40 AM  Result Value Ref Range Status   Specimen Description BLOOD RIGHT ANTECUBITAL  Final   Special Requests   Final    BOTTLES DRAWN AEROBIC AND ANAEROBIC Blood Culture adequate volume   Culture   Final    NO GROWTH 2 DAYS Performed at [facility], [place] [address] [place], [place] [address]    Report Status PENDING  Incomplete  Culture, body fluid-bottle     Status:  None (Preliminary result)   Collection Time: 04/18/18  1:47 PM  Result Value Ref Range Status   Specimen Description PERITONEAL CAVITY  Final   Special Requests NONE  Final   Culture   Final    NO GROWTH 2 DAYS Performed at [facility], [address], [place], [place] [address]    Report Status PENDING  Incomplete  Gram stain     Status: None   Collection Time: 04/18/18  1:47 PM  Result Value Ref Range Status   Specimen Description PERITONEAL CAVITY  Final   Special Requests NONE  Final   Gram Stain   Final    WBC PRESENT,BOTH PMN AND MONONUCLEAR NO ORGANISMS SEEN CYTOSPIN SMEAR Performed at [facility], [address], [place], [place] [address]    Report Status 04/18/2018 FINAL  Final  MRSA PCR Screening     Status: None   Collection Time: 04/18/18  2:37 PM  Result Value Ref Range Status   MRSA by PCR NEGATIVE NEGATIVE Final    Comment:        The GeneXpert MRSA Assay (FDA approved for NASAL specimens only), is one component of a comprehensive MRSA colonization surveillance program. It is not intended to diagnose MRSA infection nor to guide or monitor treatment for MRSA infections. Performed at [facility], [place] [address], [place], [place] [address]   Respiratory Panel by PCR     Status: None   Collection Time: 04/19/18 11:05 AM  Result Value Ref Range Status   Adenovirus NOT DETECTED NOT DETECTED Final   Coronavirus 229E NOT DETECTED NOT DETECTED Final    Comment: (NOTE) The Coronavirus on the Respiratory Panel, DOES NOT test for the novel  Coronavirus (2019 nCoV)    Coronavirus HKU1 NOT DETECTED NOT DETECTED Final   Coronavirus NL63 NOT DETECTED NOT DETECTED Final   Coronavirus OC43 NOT DETECTED NOT DETECTED Final   Metapneumovirus NOT DETECTED NOT DETECTED Final   Rhinovirus / Enterovirus NOT DETECTED NOT DETECTED Final   Influenza A NOT DETECTED NOT DETECTED Final   Influenza B NOT DETECTED NOT DETECTED Final   Parainfluenza Virus 1  NOT DETECTED NOT DETECTED Final   Parainfluenza Virus 2 NOT DETECTED NOT DETECTED Final   Parainfluenza Virus 3 NOT DETECTED NOT DETECTED Final   Parainfluenza Virus 4 NOT DETECTED NOT DETECTED Final   Respiratory Syncytial Virus NOT DETECTED NOT DETECTED Final   Bordetella pertussis NOT DETECTED NOT DETECTED Final   Chlamydophila pneumoniae NOT DETECTED NOT DETECTED Final   Mycoplasma pneumoniae NOT DETECTED NOT DETECTED Final    Comment: Performed at [facility], [place] [address] [place], [place] [address]    RADIOLOGY STUDIES/RESULTS: Ct Abdomen Pelvis Wo Contrast  Result Date: 04/18/2018 CLINICAL DATA:  Fever a few days as recently completed treatment for staph peritonitis with last dose of Ancef given 04/13/2018. Fever this morning with vomiting and abdominal pain over the weekend. Patient with end-stage renal disease post dialysis this morning. EXAM: CT ABDOMEN AND PELVIS WITHOUT CONTRAST TECHNIQUE: Multidetector CT imaging of the abdomen and pelvis was performed following the standard protocol without IV contrast. COMPARISON:  04/09/2018, 03/27/2018 and 11/11/2017 as well as 02/05/2015 FINDINGS: Lower chest: Minimal linear atelectasis/scarring left base. Small sliding hiatal hernia. Hepatobiliary: Previous cholecystectomy. Liver and biliary tree are within normal. Pancreas: Normal. Spleen: Normal. Adrenals/Urinary Tract: Adrenal glands are normal. Kidneys are somewhat small without hydronephrosis or nephrolithiasis. Ureters and bladder are normal. Stomach/Bowel: Surgical sutures over the stomach compatible previous bypass procedure. Small bowel is normal. Appendix is normal. Mild diverticulosis over the descending colon and sigmoid colon. Vascular/Lymphatic: Minimal calcified plaque over the abdominal aorta. No evidence of adenopathy. Reproductive: Normal. Other: Peritoneal dialysis catheter enters the lower abdomen just right of midline. Tip over the midline pelvis. Mild amount of free  peritoneal fluid likely related to patient's peritoneal dialysis. Tiny amount of free peritoneal air also likely related to patient's peritoneal dialysis as this is unchanged. No focal inflammatory process. Musculoskeletal: Unchanged. IMPRESSION: Mild amount of free peritoneal fluid and tiny amount of free peritoneal air likely related patient's peritoneal dialysis. Dialysis catheter over the midline pelvis without significant change. Diverticulosis of the colon without active inflammation. Postsurgical changes as described. Electronically Signed   By: Marin Olp M.D.   On: 04/18/2018 15:50   X-ray Chest Pa And Lateral  Result Date: 04/19/2018 CLINICAL DATA:  Shortness of breath, nausea EXAM: CHEST - 2 VIEW COMPARISON:  04/18/2018 FINDINGS: Linear left base atelectasis or scarring. Heart is upper limits normal in size. Right lung clear. No effusions or acute bony abnormality. IMPRESSION: Left basilar atelectasis or scarring. Electronically Signed   By: Rolm Baptise M.D.   On: 04/19/2018 07:47   Ct Abdomen Pelvis W Contrast  Result Date: 04/09/2018 CLINICAL DATA:  Nausea and vomiting and abdominal pain EXAM: CT ABDOMEN AND PELVIS WITH CONTRAST TECHNIQUE: Multidetector CT imaging of the abdomen and pelvis was performed using the standard protocol following bolus administration of intravenous contrast.  CONTRAST:  133m OMNIPAQUE IOHEXOL 300 MG/ML  SOLN COMPARISON:  03/27/2018 FINDINGS: Lower chest: No acute abnormality. Hepatobiliary: Diffuse fatty infiltration of the liver is noted. No focal mass is seen. The gallbladder has been surgically removed. Pancreas: Unremarkable. No pancreatic ductal dilatation or surrounding inflammatory changes. Spleen: Normal in size without focal abnormality. Adrenals/Urinary Tract: Adrenal glands are within normal limits. Renal vascular calcifications are noted. No obstructive changes are seen. The ureters are within normal limits bilaterally. The bladder is decompressed.  Stomach/Bowel: Scattered diverticular changes noted within the colon. No evidence of diverticulitis is seen. The appendix is within normal limits. No obstructive or inflammatory changes of the small bowel are noted. Changes of prior sleeve gastrectomy are seen. Small sliding-type hiatal hernia is noted. Vascular/Lymphatic: Aortic atherosclerosis. No enlarged abdominal or pelvic lymph nodes. Reproductive: Uterus and bilateral adnexa are unremarkable. Other: Peritoneal dialysis catheter is noted deep within the pelvis. Small amount of free fluid is noted within the pelvis associated with the catheter. Additionally there are a few foci of air identified in the upper abdomen adjacent to the spleen and stomach. These are likely related to the peritoneal dialysis catheter. By history the patient is receiving treatment for peritonitis. Musculoskeletal: No acute bony abnormality is seen. IMPRESSION: Minimal free air and free fluid within the abdomen likely related to the patient's known peritoneal dialysis catheter. No definitive evidence to suggest perforation are seen. Diverticulosis without diverticulitis. Fatty liver. Electronically Signed   By: MInez CatalinaM.D.   On: 04/09/2018 16:50   Ct Abdomen Pelvis W Contrast  Result Date: 03/27/2018 CLINICAL DATA:  Lower abdominal pain starting Friday. Patient does dialysis every day at home. Prior gastric sleeve, peritoneal catheter placement and cholecystectomy. EXAM: CT ABDOMEN AND PELVIS WITH CONTRAST TECHNIQUE: Multidetector CT imaging of the abdomen and pelvis was performed using the standard protocol following bolus administration of intravenous contrast. CONTRAST:  1093mOMNIPAQUE IOHEXOL 300 MG/ML  SOLN COMPARISON:  11/11/2017 CT FINDINGS: Lower chest: Top-normal heart size with coronary arteriosclerosis. No pericardial effusion or thickening. Clear lung bases with left basilar atelectasis. Hepatobiliary: Steatosis of the liver. More focal fatty infiltration adjacent  to the falciform ligament. Surface nodularity of the liver compatible morphologic changes of cirrhosis. No space-occupying mass of the liver is seen. The patient is status post cholecystectomy. Pancreas: No pancreatic mass or ductal dilatation.  No inflammation. Spleen: Normal Adrenals/Urinary Tract: Normal bilateral adrenal glands. Mild bilateral renal atrophy and cortical thinning. No obstructive uropathy, mass or nephrolithiasis. The urinary bladder is unremarkable for the degree of distention. It is slightly thick-walled but this is likely due to underdistention. Cystitis is believed less likely. Stomach/Bowel: Gastric sleeve procedure without complicating features. Small hiatal hernia is noted. Normal small bowel rotation without obstruction or inflammation. Sigmoid diverticulosis without acute diverticulitis. The appendix is normal caliber with probable small amount of retained contrast noted. Vascular/Lymphatic: Aortoiliac atherosclerosis. No adenopathy by CT size criteria. Reproductive: Uterus and bilateral adnexa are unremarkable. Other: Dialysis catheter is seen coiled within the mid pelvis. Musculoskeletal: No acute nor suspicious osseous abnormality. IMPRESSION: 1. Steatosis of the liver with slight morphologic changes of cirrhosis given surface nodularity of the liver. No space-occupying mass is seen. 2. Status post cholecystectomy. 3. Gastric sleeve procedure without complicating features. 4. Sigmoid diverticulosis without acute diverticulitis. 5. Percutaneous peritoneal dialysis catheter is seen coiled pelvis without complicating features. Electronically Signed   By: DaAshley Royalty.D.   On: 03/27/2018 20:55   Dg Chest Port 1 View  Result Date: 04/18/2018  CLINICAL DATA:  Fever EXAM: PORTABLE CHEST 1 VIEW COMPARISON:  03/16/2018 FINDINGS: The heart size and mediastinal contours are within normal limits. Both lungs are clear. The visualized skeletal structures are unremarkable. IMPRESSION: No active  disease. Electronically Signed   By: Kathreen Devoid   On: 04/18/2018 11:31   US Abdomen Limited Ruq  Result Date: 04/19/2018 CLINICAL DATA:  Abnormal LFTs. EXAM: ULTRASOUND ABDOMEN LIMITED RIGHT UPPER QUADRANT COMPARISON:  CT abdomen pelvis from yesterday. FINDINGS: Gallbladder: Surgically absent. Common bile duct: Diameter: 4 mm, normal. Liver: No focal lesion identified. Subtle nodular contour. Coarsened, heterogeneous parenchymal echogenicity. Portal vein is patent on color Doppler imaging with normal direction of blood flow towards the liver. IMPRESSION: 1. Subtle liver surface nodularity with coarsened, heterogeneous parenchymal echogenicity, suspicious for cirrhosis. Electronically Signed   By: Titus Dubin M.D.   On: 04/19/2018 11:40     LOS: 3 days   Oren Binet, MD  [facility]  If 7PM-7AM, please contact night-coverage  Please page via [email or web address]  Go to [email or web address] and use [facility]'s universal password to access. If you do not have the password, please contact the hospital operator.  Locate the [facility] provider you are looking for under [facility] and page to a number that you can be directly reached. If you still have difficulty reaching the provider, please page the [facility] (Director on Call) for the Hospitalists listed on amion for assistance.  04/21/2018, 11:29 AM

## 2018-04-21 NOTE — Progress Notes (Signed)
Spoke with husband today who asked about update regarding his wife. I notified him about pts. mental status as well as MRI/Hemodyalisis planned for today.

## 2018-04-21 NOTE — Progress Notes (Addendum)
[facility] Progress Note   Dialysis Orders: CCPD edw 116.5 6 exchnage 2.5 L dwell 1.5 hr dry day  Recent hgb 12 ipTH 866 Calcitriol 0.5/day, sensipar 60 qod, Aurixya 2 ac phoslo 3 ac  Assessment/Plan: 1. Fever -Tmax 99.3   hx recent staph warneri peritonitis tx Ancef IP - cell count drawn in ED - WBC cell count only 4 on 3/30 so doubt perotinitis   - on empiric Vanc and maxipime - resp panel negative - no positive cultures yet - high procalcitonin, . STill low grade temps. Admission Abd CT unrevealing,  2. hypokalemia - K 2.7  have liberalized diet to heart healthy carb mod to increase ^ K foods - remains low continue to replete- reordered 5 runs of K 3. Elevated LFTs- AST 216  > 287 > 464 > 533 (was 24 03/23/18 at HD unit) other LFT ok-recent AST had been normal in early March - NH3 48 - doesn't explain her behavior today. lactulose enema given 4. ESRD -  CCPD- Na better today after all 2.5s net UF 1877 wt 114.1 below edw today via bed scale. Plan HD treatment today - NO PD tonight see if we get better clearances/- schedule HD after MRI due to already being sedated for MRI  MS has been off and on somnolent the past 2 days, creat up > 15, considering uremia from poor PD clearance. Planning HD today and then reassess.  5. BP/volume  - BP lower with all 2.5s last night;- not eating - plan keep even during HD treatment today -  6. Anemia  - hgb 11- 12 stable - no ESA/Fe 7. Metabolic bone disease -  Resume  home ca acetate but hold sensipar for now due to marginally low corrected  Ca 8. Nutrition - alb 2.1 - renal carb mod - very poor intake 9. Mental status - change  - work up per primary - EEG metabolic encephalopathy - Cr 16 - 17 unchanged - plan HD today to see if that will improve kinetics and low Cr  To be done after MRI  Myriam Jacobson, PA-C [place] 04/21/2018,10:30 AM  LOS: 3 days   Pt seen, examined and agree w assess/plan as above  with additions as indicated.  [facility] 04/21/2018, 2:01 PM     Subjective:   Refusing meds prn RN note. Poorly rousable. Snoring - had ativan sedation in advance of MRI  Objective Vitals:   04/20/18 2022 04/21/18 0041 04/21/18 0448 04/21/18 0948  BP: 100/63 106/66 (!) 110/59 (!) 103/57  Pulse: 80 80 81 80  Resp: 18 18 18 17   Temp: 97.7 F (36.5 C) 98.3 F (36.8 C) 98.2 F (36.8 C) 98.4 F (36.9 C)  TempSrc: Oral Oral Oral Oral  SpO2: 98% 100% 100% 99%  Weight:    114.1 kg  Height:       Physical Exam General: obese sleeping female snoring - on room air - minimally arousable   Heart: RRR Lungs: dim BS poor expansion grossly clear Abdomen: obese large pannus nontender Extremities: no sig LE edema Dialysis Access:  PD cath   Additional Objective Labs: Basic Metabolic Panel: Recent Labs  Lab 04/18/18 1435  04/20/18 0007 04/20/18 0025 04/21/18 0822  NA  --    < > 132* 130* 136  K  --    < > 2.8* 2.9* 2.7*  CL  --    < > 94* 91* 99  CO2  --    < >  15* 18* 21*  GLUCOSE  --    < > 141* 141* 170*  BUN  --    < > 44* 42* 39*  CREATININE  --    < > 16.88* 17.02* 16.33*  CALCIUM  --    < > 7.0* 7.1* 7.4*  PHOS 4.5  --   --   --   --    < > = values in this interval not displayed.   Liver Function Tests: Recent Labs  Lab 04/19/18 0521 04/20/18 0025 04/21/18 0822  AST 287* 464* 533*  ALT 13 18 30   ALKPHOS 129* 145* 129*  BILITOT 0.9 0.9 0.9  PROT 5.3* 5.8* 5.6*  ALBUMIN 2.1* 2.5* 2.3*   Recent Labs  Lab 04/18/18 1143  LIPASE 26   CBC: Recent Labs  Lab 04/18/18 1143 04/19/18 0521 04/20/18 0025 04/21/18 0822  WBC 4.7 3.7* 4.3 9.5  NEUTROABS 3.7 2.6 2.8  --   HGB 12.0 11.3* 12.8 12.2  HCT 37.1 35.2* 37.3 34.4*  MCV 98.7 97.8 96.4 93.5  PLT 205 181 184 217   Blood Culture    Component Value Date/Time   SDES PERITONEAL CAVITY 04/18/2018 1347   SDES PERITONEAL CAVITY 04/18/2018 1347   SPECREQUEST NONE 04/18/2018 1347    SPECREQUEST NONE 04/18/2018 1347   CULT  04/18/2018 1347    NO GROWTH 2 DAYS Performed at [facility], [place] [address], [place], [place] [address]    REPTSTATUS PENDING 04/18/2018 1347   REPTSTATUS 04/18/2018 FINAL 04/18/2018 1347    Cardiac Enzymes: No results for input(s): CKTOTAL, CKMB, CKMBINDEX, TROPONINI in the last 168 hours. CBG: Recent Labs  Lab 04/20/18 0737 04/20/18 1210 04/20/18 1621 04/20/18 2133 04/21/18 0751  GLUCAP 178* 132* 90 181* 166*   Iron Studies: No results for input(s): IRON, TIBC, TRANSFERRIN, FERRITIN in the last 72 hours. Lab Results  Component Value Date   INR 1.3 (H) 04/21/2018   INR 1.0 11/26/2017   INR 1.00 05/06/2016   Studies/Results: US Abdomen Limited Ruq  Result Date: 04/19/2018 CLINICAL DATA:  Abnormal LFTs. EXAM: ULTRASOUND ABDOMEN LIMITED RIGHT UPPER QUADRANT COMPARISON:  CT abdomen pelvis from yesterday. FINDINGS: Gallbladder: Surgically absent. Common bile duct: Diameter: 4 mm, normal. Liver: No focal lesion identified. Subtle nodular contour. Coarsened, heterogeneous parenchymal echogenicity. Portal vein is patent on color Doppler imaging with normal direction of blood flow towards the liver. IMPRESSION: 1. Subtle liver surface nodularity with coarsened, heterogeneous parenchymal echogenicity, suspicious for cirrhosis. Electronically Signed   By: Titus Dubin M.D.   On: 04/19/2018 11:40   Medications: . sodium chloride 500 mL (04/18/18 1326)  . ceFEPime (MAXIPIME) IV 1 g (04/20/18 1320)  . dialysis solution 1.5% low-MG/low-CA    . metronidazole 500 mg (04/21/18 0817)  . sodium chloride     . aspirin EC  81 mg Oral Daily  . calcium acetate  1,334 mg Oral TID WC  . Chlorhexidine Gluconate Cloth  6 each Topical Q0600  . ferric citrate  210 mg Oral TID WC  . folic acid  1 mg Oral Daily  . gentamicin cream  1 application Topical Daily  . guaiFENesin  600 mg Oral BID  . heparin  5,000 Units Subcutaneous Q8H  . insulin aspart   0-5 Units Subcutaneous QHS  . insulin aspart  0-9 Units Subcutaneous TID WC  . lactulose  20 g Oral BID  . pantoprazole  40 mg Oral Daily

## 2018-04-21 NOTE — Progress Notes (Signed)
Received a call from the hemodialysis nurse who was concerned that patient was slightly hypotensive prior to HD treatment.  I had put measures into place for midodrine, albumin and to limit the UF goal.    I then received a call from the floor nurse who stated that she spoke with the husband in order to get consent for hemodialysis and he refused to consent.  I spoke to the husband Elberta Fortis who expressed frustration with not being able to be with wife and that he did not have the MRI results.  He stated that she never tolerated hemodialysis well and he was concerned that the patient could not speak for herself.  I attempted to tell him the measures that we put in place to keep her safe but I was unable to convince him to give consent.    By then time all of this happened, the patient had been on hemodialysis for 2 hours without incident.  I instructed the nurse to take her off of treatment.  The husband said that he was going to speak to the patient's out patient dialysis nurse tomorrow and would be wiling to revisit the issue   Louis Meckel

## 2018-04-21 NOTE — Progress Notes (Signed)
Pt refused to take morning meds, clenching her teeth when I tried to give her.

## 2018-04-21 NOTE — Progress Notes (Addendum)
Patient remained confused over the night mute, no communication, Lactulose enema given too patient from 1: 30am to 2:am. Patient had good output of stool, was combative throughout the enema procedure. Patient's sister early in the night and son called at 2:30am.  Both family expressed their concern about pt's change in status.  Son said he will call in the morning to speak with the Doctor.   Patient refused to take po medications. Clenched her teeth and refused to open mouth.

## 2018-04-22 DIAGNOSIS — R4182 Altered mental status, unspecified: Secondary | ICD-10-CM

## 2018-04-22 DIAGNOSIS — R945 Abnormal results of liver function studies: Secondary | ICD-10-CM

## 2018-04-22 DIAGNOSIS — R7989 Other specified abnormal findings of blood chemistry: Secondary | ICD-10-CM

## 2018-04-22 LAB — CBC
HCT: 34 % — ABNORMAL LOW (ref 36.0–46.0)
HCT: 32.6 % — ABNORMAL LOW (ref 36.0–46.0)
Hemoglobin: 11.5 g/dL — ABNORMAL LOW (ref 12.0–15.0)
Hemoglobin: 10.8 g/dL — ABNORMAL LOW (ref 12.0–15.0)
MCH: 31.5 pg (ref 26.0–34.0)
MCH: 31.6 pg (ref 26.0–34.0)
MCHC: 33.8 g/dL (ref 30.0–36.0)
MCHC: 33.1 g/dL (ref 30.0–36.0)
MCV: 93.2 fL (ref 80.0–100.0)
MCV: 95.3 fL (ref 80.0–100.0)
Platelets: 193 10*3/uL (ref 150–400)
Platelets: 210 10*3/uL (ref 150–400)
RBC: 3.65 MIL/uL — ABNORMAL LOW (ref 3.87–5.11)
RBC: 3.42 MIL/uL — ABNORMAL LOW (ref 3.87–5.11)
RDW: 14.5 % (ref 11.5–15.5)
RDW: 14.8 % (ref 11.5–15.5)
WBC: 10.8 10*3/uL — ABNORMAL HIGH (ref 4.0–10.5)
WBC: 8.5 10*3/uL (ref 4.0–10.5)
nRBC: 0 % (ref 0.0–0.2)
nRBC: 0.2 % (ref 0.0–0.2)

## 2018-04-22 LAB — CBC WITH DIFFERENTIAL/PLATELET
Abs Immature Granulocytes: 0 10*3/uL (ref 0.00–0.07)
Band Neutrophils: 1 %
Basophils Absolute: 0 10*3/uL (ref 0.0–0.1)
Basophils Relative: 0 %
Eosinophils Absolute: 0.7 10*3/uL — ABNORMAL HIGH (ref 0.0–0.5)
Eosinophils Relative: 6 %
HCT: 34.6 % — ABNORMAL LOW (ref 36.0–46.0)
Hemoglobin: 11.6 g/dL — ABNORMAL LOW (ref 12.0–15.0)
Lymphocytes Relative: 9 %
Lymphs Abs: 1 10*3/uL (ref 0.7–4.0)
MCH: 31.5 pg (ref 26.0–34.0)
MCHC: 33.5 g/dL (ref 30.0–36.0)
MCV: 94 fL (ref 80.0–100.0)
Monocytes Absolute: 0.8 10*3/uL (ref 0.1–1.0)
Monocytes Relative: 7 %
Neutro Abs: 8.7 10*3/uL — ABNORMAL HIGH (ref 1.7–7.7)
Neutrophils Relative %: 77 %
Platelets: 193 10*3/uL (ref 150–400)
RBC: 3.68 MIL/uL — ABNORMAL LOW (ref 3.87–5.11)
RDW: 14.6 % (ref 11.5–15.5)
WBC: 11.1 10*3/uL — ABNORMAL HIGH (ref 4.0–10.5)
nRBC: 0 /100 WBC
nRBC: 0.3 % — ABNORMAL HIGH (ref 0.0–0.2)

## 2018-04-22 LAB — COMPREHENSIVE METABOLIC PANEL
ALT: 27 U/L (ref 0–44)
AST: 426 U/L — ABNORMAL HIGH (ref 15–41)
Albumin: 2.9 g/dL — ABNORMAL LOW (ref 3.5–5.0)
Alkaline Phosphatase: 140 U/L — ABNORMAL HIGH (ref 38–126)
Anion gap: 18 — ABNORMAL HIGH (ref 5–15)
BUN: 20 mg/dL (ref 6–20)
CO2: 21 mmol/L — ABNORMAL LOW (ref 22–32)
Calcium: 7.8 mg/dL — ABNORMAL LOW (ref 8.9–10.3)
Chloride: 100 mmol/L (ref 98–111)
Creatinine, Ser: 11.18 mg/dL — ABNORMAL HIGH (ref 0.44–1.00)
GFR calc Af Amer: 4 mL/min — ABNORMAL LOW (ref >60)
GFR calc non Af Amer: 3 mL/min — ABNORMAL LOW (ref >60)
Glucose, Bld: 97 mg/dL (ref 70–99)
Potassium: 3.4 mmol/L — ABNORMAL LOW (ref 3.5–5.1)
Sodium: 139 mmol/L (ref 135–145)
Total Bilirubin: 1.4 mg/dL — ABNORMAL HIGH (ref 0.3–1.2)
Total Protein: 5.7 g/dL — ABNORMAL LOW (ref 6.5–8.1)

## 2018-04-22 LAB — RENAL FUNCTION PANEL
Albumin: 2.7 g/dL — ABNORMAL LOW (ref 3.5–5.0)
Anion gap: 14 (ref 5–15)
BUN: 20 mg/dL (ref 6–20)
CO2: 23 mmol/L (ref 22–32)
Calcium: 7.9 mg/dL — ABNORMAL LOW (ref 8.9–10.3)
Chloride: 100 mmol/L (ref 98–111)
Creatinine, Ser: 9.35 mg/dL — ABNORMAL HIGH (ref 0.44–1.00)
GFR calc Af Amer: 5 mL/min — ABNORMAL LOW (ref >60)
GFR calc non Af Amer: 4 mL/min — ABNORMAL LOW (ref >60)
Glucose, Bld: 102 mg/dL — ABNORMAL HIGH (ref 70–99)
Phosphorus: 3.2 mg/dL (ref 2.5–4.6)
Potassium: 3.5 mmol/L (ref 3.5–5.1)
Sodium: 137 mmol/L (ref 135–145)

## 2018-04-22 LAB — VITAMIN B12: Vitamin B-12: 1407 pg/mL — ABNORMAL HIGH (ref 180–914)

## 2018-04-22 LAB — GLUCOSE, CAPILLARY
Glucose-Capillary: 94 mg/dL (ref 70–99)
Glucose-Capillary: 113 mg/dL — ABNORMAL HIGH (ref 70–99)
Glucose-Capillary: 104 mg/dL — ABNORMAL HIGH (ref 70–99)
Glucose-Capillary: 97 mg/dL (ref 70–99)

## 2018-04-22 LAB — HIV ANTIBODY (ROUTINE TESTING W REFLEX): HIV Screen 4th Generation wRfx: NONREACTIVE

## 2018-04-22 LAB — RPR: RPR Ser Ql: NONREACTIVE

## 2018-04-22 LAB — MAGNESIUM: Magnesium: 1.8 mg/dL (ref 1.7–2.4)

## 2018-04-22 LAB — AMMONIA: Ammonia: 16 umol/L (ref 9–35)

## 2018-04-22 MED ORDER — CHLORHEXIDINE GLUCONATE CLOTH 2 % EX PADS
6.0000 | MEDICATED_PAD | Freq: Every day | CUTANEOUS | Status: DC
  Administered 2018-04-23 – 2018-04-24 (×2): 6 via TOPICAL

## 2018-04-22 MED ORDER — HEPARIN SODIUM (PORCINE) 1000 UNIT/ML DIALYSIS: 4000.0000 [IU] | INTRAMUSCULAR | Status: DC | PRN

## 2018-04-22 MED ORDER — POTASSIUM CHLORIDE 10 MEQ/100ML IV SOLN
10.0000 meq | INTRAVENOUS | Status: AC
  Administered 2018-04-22 (×2): 10 meq via INTRAVENOUS
  Filled 2018-04-22: qty 100

## 2018-04-22 MED ORDER — CHLORHEXIDINE GLUCONATE CLOTH 2 % EX PADS
6.0000 | MEDICATED_PAD | Freq: Every day | CUTANEOUS | Status: DC
  Administered 2018-04-24: 6 via TOPICAL

## 2018-04-22 MED ORDER — HEPARIN SODIUM (PORCINE) 1000 UNIT/ML IJ SOLN
INTRAMUSCULAR | Status: AC
  Filled 2018-04-22: qty 1

## 2018-04-22 NOTE — Progress Notes (Signed)
Pt is refusing all oral meds. When i atempted to feed her she slapped the spoon out of my hand and started swinging. No morning med's were able to be given. Doctor notified. Will continue to monitor.

## 2018-04-22 NOTE — Consult Note (Signed)
Neurology Consultation  Reason for Consult: Altered mental status  Referring Physician: Dr. Sloan Leiter   CC: Altered mental status.  History is obtained from: Chart review.  HPI: Brenda Davis is a 53 y.o. female with PMHx significant for ESRD due to diabetes, previous hemodialysis patient but currently on peritoneal dialysis, anemia of chronic kidney disease, on B12 and iron supplements, HTN, morbid obesity, s/p gastric sleeve bariatric procedure 2014, atatus post cholecystectomy, and nodular liver/cirrhosis diagnosed in fall 2019 (following a battery of tests, etiology attributed to NASH). Also with non-STEMI 02/24/2018 followed by2/7/20 cardiac cath:revealing moderate three-vessel disease with extensive calcification in all 3 major coronary arteries, but no stenting warranted, with patient continued on 81 mg aspirin, atorvastatin 80 mg, and Coreg.   She has a recent admission due to peritonitis-cultures grew pansensitive staph waneri which was treated with 4 weeks of antibiotics, completed on 04/13/18. She was readmitted on 04/17/17 due to severe abdominal pain with associated temperatures and generalized fatigue, found to be febrile and hypotensive and initially treated for sepsis.  Repeat peritoneal fluid culture came back negative.  Patient continued to have worsening altered mental status and confused, CT abdomen without any acute abnormality, EEG was consistent with metabolic encephalopathy, MRI brain without any acute abnormality.  Initially it was thought to be either uremic versus hepatic encephalopathy.  Plan was to start her on hemodialysis instead of peritoneal; she did receive 2-hour of HD yesterday but apparently husband refused to give consent so it was discontinued.  Having good bowel movements with rectal lactulose and GI does not think that there is much component of hepatic encephalopathy at this time.  When seen this morning she was awake but not responding to any  commands and giving [place] a blank stare.  She refused to talk but was withdrawing and looking angry with noxious stimuli, swatting at examiners at times.  ROS: Unable to obtain due to altered mental status.   Past Medical History:  Diagnosis Date  . Anemia   . Chronic kidney disease    STAGE 3 CHRONIC KIDNEY DISEASE SECONDARY TO DIABETIC GLOMERULOSCLEROSIS AND UNCONTROLLED HYPERTENSION - PER OFFICE NOTES DR. [facility].  Marland Kitchen ESRD on dialysis [facility])    "MWF; [address] (05/06/2016)  . Gout   . HCAP (healthcare-associated pneumonia) 05/06/2016  . History of blood transfusion    "related to surgery"  . Hyperlipidemia   . Hypertension   . Morbid obesity ([place])   . Pain    LEFT SHOULDER PAIN - WAS SEEN AT AN URGENT CARE - GIVEN SLING FOR COMFORT AND TOLD ROM AS TOLERATED.  Marland Kitchen Palpitations 09/24/2016  . Type II diabetes mellitus ([place])    "gastric sleeve OR corrected this" (05/06/2016)    Family History  Problem Relation Age of Onset  . Hypertension Mother   . Mitral valve prolapse Mother   . Diabetes Father   . Arthritis Other   . Hyperlipidemia Other   . Cancer Neg Hx   . Heart disease Neg Hx   . Kidney disease Neg Hx   . Stroke Neg Hx     Social History:   reports that she quit smoking about 11 years ago. Her smoking use included cigarettes. She has a 16.00 pack-year smoking history. She has never used smokeless tobacco. She reports that she does not drink alcohol or use drugs.  Medications  Current Facility-Administered Medications:  .  0.9 %  sodium chloride infusion, , Intravenous, PRN, Raiford Noble Latif, DO, Last Rate:  10 mL/hr at 04/18/18 1326, 500 mL at 04/18/18 1326 .  acetaminophen (TYLENOL) tablet 650 mg, 650 mg, Oral, Q6H PRN, Raiford Noble [place], DO, 650 mg at 04/18/18 3151 .  albumin human 25 % solution 25 g, 25 g, Intravenous, PRN, Corliss Parish, MD, Stopped at 04/22/18 0554 .  albuterol (PROVENTIL) (2.5 MG/3ML) 0.083% nebulizer solution  2.5 mg, 2.5 mg, Nebulization, Q2H PRN, Sheikh, Omair Latif, DO .  aspirin EC tablet 81 mg, 81 mg, Oral, Daily, Raiford Noble [place], DO, 81 mg at 04/19/18 0941 .  calcium acetate (PHOSLO) capsule 1,334 mg, 1,334 mg, Oral, TID WC, Sheikh, Omair [place], DO, 1,334 mg at 04/19/18 1633 .  camphor-menthol (SARNA) lotion, , Topical, PRN, Alric Seton, PA-C .  ceFEPIme (MAXIPIME) 1 g in sodium chloride 0.9 % 100 mL IVPB, 1 g, Intravenous, Q24H, Sheikh, Omair [place], DO, Last Rate: 200 mL/hr at 04/21/18 2347, 1 g at 04/21/18 2347 .  Chlorhexidine Gluconate Cloth 2 % PADS 6 each, 6 each, Topical, Q0600, Alric Seton, PA-C, 6 each at 04/22/18 0602 .  dialysis solution 1.5% low-MG/low-CA dianeal solution, , Intraperitoneal, Q24H, [place], Martha, PA-C .  ferric citrate (AURYXIA) tablet 210 mg, 210 mg, Oral, TID WC, Sheikh, Omair [place], DO, 210 mg at 04/19/18 1633 .  folic acid (FOLVITE) tablet 1 mg, 1 mg, Oral, Daily, Sheikh, [place], DO, 1 mg at 04/19/18 0941 .  gentamicin cream (GARAMYCIN) 0.1 % 1 application, 1 application, Topical, Daily, Alric Seton, PA-C, 1 application at 76/16/07 0849 .  guaiFENesin (MUCINEX) 12 hr tablet 600 mg, 600 mg, Oral, BID, Raiford Noble Latif, DO, 600 mg at 04/21/18 2335 .  heparin injection 5,000 Units, 5,000 Units, Subcutaneous, Q8H, Raiford Noble [place], [place], 5,000 Units at 04/22/18 [phone] .  insulin aspart (novoLOG) injection 0-5 Units, 0-5 Units, Subcutaneous, QHS, Sheikh, Omair Latif, DO .  insulin aspart (novoLOG) injection 0-9 Units, 0-9 Units, Subcutaneous, TID WC, Raiford Noble [place], [place], 2 Units at 04/21/18 [phone] .  lactulose (CHRONULAC) 10 GM/15ML solution 20 g, 20 g, Oral, TID, Ghimire, Henreitta Leber, MD, 20 g at 04/21/18 2335 .  midodrine (PROAMATINE) tablet 10 mg, 10 mg, Oral, BID WC, Corliss Parish, MD .  ondansetron (ZOFRAN) tablet 4 mg, 4 mg, Oral, Q6H PRN **OR** ondansetron (ZOFRAN) injection 4 mg, 4 mg, Intravenous, Q6H PRN, Sheikh, Omair Latif, DO .   pantoprazole (PROTONIX) EC tablet 40 mg, 40 mg, Oral, Daily, Alekh, Kshitiz, MD, 40 mg at 04/19/18 1220 .  sodium chloride 0.9 % bolus 500 mL, 500 mL, Intravenous, PRN, Alfredia Ferguson, Omair Latif, DO .  thiamine (B-1) injection 100 mg, 100 mg, Intravenous, Daily, Ghimire, Henreitta Leber, MD, 100 mg at 04/22/18 4854  Exam: Current vital signs: BP (!) 137/92 (BP Location: Right Arm)   Pulse 100   Temp (!) 97.5 F (36.4 C)   Resp 18   Ht 5' 5"  (1.651 m)   Wt 115.6 kg   SpO2 100%   BMI 42.41 kg/m  Vital signs in last 24 hours: Temp:  [97.5 F (36.4 C)-99 F (37.2 C)] 97.5 F (36.4 C) (04/03 0918) Pulse Rate:  [80-101] 100 (04/03 0419) Resp:  [13-18] 18 (04/03 0419) BP: (91-137)/(42-92) 137/92 (04/03 0419) SpO2:  [99 %-100 %] 100 % (04/03 0419) Weight:  [627 kg-115.6 kg] 115.6 kg (04/03 0400)  Physical Exam  Constitutional: Appears well-developed and well-nourished.  Psych: Flat affect. Eyes: No scleral injection HENT: No OP obstrucion Head: Normocephalic.  Cardiovascular: Normal rate and regular rhythm.  Respiratory: Effort  normal, non-labored breathing GI: Soft.  No distension. There is no tenderness.  Skin: WDI  Neuro: Mental Status: Patient is awake, alert, not following any commands and giving a blank stare. When arm is passively raised and dropped over her face, she diverts hand away just before it is about to land on face.  Cranial Nerves: II: Visual Fields are full. Pupils are equal, round, and reactive to light.  Blinks to threat. Unable to perform rest of the cranial nerve exam because she was not cooperating. Motor: Positive for intermittent myoclonus in both upper and lower extremities, involving one limb or portion of a limb at a time - no whole-body myoclonus noted.   Sensory: Unable to assess other than withdrawal of BLE to plantar stimulation. Deep Tendon Reflexes: 2+ and symmetric in the biceps and patellae.  Plantars: Toes are downgoing bilaterally.   CBC     Component Value Date/Time   WBC 10.8 (H) 04/22/2018 0316   RBC 3.65 (L) 04/22/2018 0316   HGB 11.5 (L) 04/22/2018 0316   HCT 34.0 (L) 04/22/2018 0316   PLT 193 04/22/2018 0316   MCV 93.2 04/22/2018 0316   MCH 31.5 04/22/2018 0316   MCHC 33.8 04/22/2018 0316   RDW 14.5 04/22/2018 0316   LYMPHSABS 0.9 04/20/2018 0025   MONOABS 0.2 04/20/2018 0025   EOSABS 0.3 04/20/2018 0025   BASOSABS 0.0 04/20/2018 0025    CMP     Component Value Date/Time   NA 139 04/22/2018 0316   NA 140 09/24/2016 0936   K 3.4 (L) 04/22/2018 0316   CL 100 04/22/2018 0316   CO2 21 (L) 04/22/2018 0316   GLUCOSE 97 04/22/2018 0316   BUN 20 04/22/2018 0316   BUN 26 (H) 09/24/2016 0936   CREATININE 11.18 (H) 04/22/2018 0316   CREATININE 2.28 (H) 03/21/2012 0952   CALCIUM 7.8 (L) 04/22/2018 0316   PROT 5.7 (L) 04/22/2018 0316   PROT 7.2 09/24/2016 0936   ALBUMIN 2.9 (L) 04/22/2018 0316   ALBUMIN 4.5 09/24/2016 0936   AST 426 (H) 04/22/2018 0316   ALT 27 04/22/2018 0316   ALKPHOS 140 (H) 04/22/2018 0316   BILITOT 1.4 (H) 04/22/2018 0316   BILITOT 0.3 09/24/2016 0936   GFRNONAA 3 (L) 04/22/2018 0316   GFRAA 4 (L) 04/22/2018 0316    Lipid Panel     Component Value Date/Time   CHOL 225 (H) 02/26/2018 0509   TRIG 186 (H) 02/26/2018 0509   HDL 38 (L) 02/26/2018 0509   CHOLHDL 5.9 02/26/2018 0509   VLDL 37 02/26/2018 0509   LDLCALC 150 (H) 02/26/2018 0509   LDLDIRECT 161.6 11/28/2012 1042   EEG 4/1: This is an abnormal EEG. There is moderate generalized slowing of brain activity and characteristic waveforms consistent with a metabolic encephalopathy, which may be due to renal, hepatic, or other disturbances.     Imaging I have reviewed the images obtained:  MRI examination of the brain without any acute abnormality.  Assessment:  53 y.o. female with a complex PMHx who presents with peritonitis, low grade fever and subsequent development of altered mental status.  1. EEG shows no seizures.  Findings are most consistent with a metabolic encephalopathy.     2. MRI brain is negative for acute abnormality (images personally reviewed). 3. Her history, exam, labs and imaging are most consistent with metabolic encephalopathy as the primary etiology. The intermittent myoclonus seen on her exam is most suggestive of a metabolic encephalopaty. There may also be a psychogenic  component, but this is not felt to be the primary etiology.  Recommendations: 1.  Try hemodialysis for a few days and see if that will improve her mental status.  2.  Continue lactulose for any hepatic encephalopathy contribution. 3.  No meningismus on exam. No fever and white count is only mildly elevated. No need for LP at this time. 4.  Given history of gastric sleeve operation, a comprehensive vitamin panel is recommended. So far, recent B1 level was normal and B12 obtained today was also normal.   5. Ammonia normal. AST elevated. Cr markedly elevated, RPR non-reactive, TSH normal. .   Lorella Nimrod PGY3 Pager [phone] 04/22/2018, 9:34 AM   I have seen and examined the patient. I have formulated the assessment and plan. 53 year old female with encephalopathy in the setting of renal failure and peritonitis. Overall clinical picture is most consistent with a metabolic encephalopathy. Would obtain consent for HD and continue for several sessions. Patients with uremic encephalopathy often experience a delayed response neurologically to HD.  Electronically signed: Dr. Kerney Elbe

## 2018-04-22 NOTE — Progress Notes (Signed)
[facility] Progress Note  Subjective: had 2 hr HD last night ,creat down 11 today, pt still altered.   Vitals:   04/22/18 0400 04/22/18 0419 04/22/18 0918 04/22/18 1226  BP:  (!) 137/92 140/88 127/76  Pulse:  100 (!) 102 92  Resp:  18 14 20   Temp:  99 F (37.2 C) (!) 97.5 F (36.4 C) 97.8 F (36.6 C)  TempSrc:  Oral    SpO2:  100% 99% 96%  Weight: 115.6 kg     Height:        Inpatient medications: . aspirin EC  81 mg Oral Daily  . calcium acetate  1,334 mg Oral TID WC  . Chlorhexidine Gluconate Cloth  6 each Topical Q0600  . ferric citrate  210 mg Oral TID WC  . folic acid  1 mg Oral Daily  . gentamicin cream  1 application Topical Daily  . guaiFENesin  600 mg Oral BID  . heparin  5,000 Units Subcutaneous Q8H  . insulin aspart  0-5 Units Subcutaneous QHS  . insulin aspart  0-9 Units Subcutaneous TID WC  . lactulose  20 g Oral TID  . midodrine  10 mg Oral BID WC  . pantoprazole  40 mg Oral Daily  . thiamine injection  100 mg Intravenous Daily   . sodium chloride 500 mL (04/18/18 1326)  . albumin human Stopped (04/22/18 0554)  . ceFEPime (MAXIPIME) IV 1 g (04/21/18 2347)  . dialysis solution 1.5% low-MG/low-CA    . sodium chloride     sodium chloride, acetaminophen, albumin human, albuterol, camphor-menthol, ondansetron **OR** ondansetron (ZOFRAN) IV, sodium chloride    Exam: General: obese sleeping female obtunded, mild myoclonic jerking   Heart: RRR Lungs: dim BS poor expansion grossly clear Abdomen: obese large pannus nontender Extremities: no sig LE edema Dialysis Access:  PD cath  Dialysis Orders: CCPD edw 116.5 6 exchnage 2.5 L dwell 1.5 hr dry day  Recent hgb 12 ipTH 866 Calcitriol 0.5/day, sensipar 60 qod, Aurixya 2 ac phoslo 3 ac  Assessment/Plan: 1. Fever -Tmax 99.3   hx recent staph warneri peritonitis tx Ancef IP. WBC cell count here only 4 on 3/30 so doubt peritonitis. RVP negative, no positive cultures yet, high procalcitonin.  Fevers gradually have resolved, afeb now. Admission abd CT unrevealing.  2. ESRD on CCPD: has been somnolent the past 2 days w myoclonic jerking classic for uremia and/or hepatic enceph.  Creat was up > 15, now 11 after shortened HD last night. Convinced husband that we need a trial of HD to see if she will wake up, he agrees to HD trial. Plan HD today and tomorrow, follow MS. No PD for now.  3. BP/volume  - BP's lower, no edema on exam, at dry wt. No fluid off on HD.   4. Elevated LFTs- AST 216  > 287 > 464 > 533 (was 24 03/23/18 at HD unit) other LFT ok-recent AST had been normal in early March - NH3 48. GI evaluating 5. Anemia ckd  - hgb 11- 12 stable - no ESA/Fe 6. MBD ckd -  Resume  home ca acetate but hold sensipar for now due to marginally low corrected  Ca 7. Nutrition - alb 2.1 - renal carb mod - very poor intake 8. Mental status change - somnolent and myoclonus, see #2 above.     [facility] 04/22/2018, 2:02 PM    Iron/TIBC/Ferritin/ %Sat    Component Value Date/Time   IRON 73  03/07/2018 1424   TIBC 290 01/03/2015 1650   FERRITIN >1500.0 (H) 03/07/2018 1424   IRONPCTSAT 29.5 03/07/2018 1424   Recent Labs  Lab 04/18/18 1435  04/21/18 0512  04/22/18 0316  NA  --    < >  --    < > 139  K  --    < >  --    < > 3.4*  CL  --    < >  --    < > 100  CO2  --    < >  --    < > 21*  GLUCOSE  --    < >  --    < > 97  BUN  --    < >  --    < > 20  CREATININE  --    < >  --    < > 11.18*  CALCIUM  --    < >  --    < > 7.8*  PHOS 4.5  --   --   --   --   ALBUMIN  --    < >  --    < > 2.9*  INR  --   --  1.3*  --   --    < > = values in this interval not displayed.   Recent Labs  Lab 04/22/18 0316  AST 426*  ALT 27  ALKPHOS 140*  BILITOT 1.4*  PROT 5.7*   Recent Labs  Lab 04/22/18 0316  WBC 11.1*  10.8*  HGB 11.6*  11.5*  HCT 34.6*  34.0*  PLT 193  193

## 2018-04-22 NOTE — Progress Notes (Signed)
Pharmacy is unable to confirm the medications the patient was taking at home. All options have been exhausted and a resolution to the situation is not expected.   The patient is confused and her husband states she handles her own medications.  Where possible, their outpatient pharmacy(s) have been contacted for the last time prescriptions were filled and that information has been added to each medication in an Order Note (highlighted yellow below the medication).  Please contact pharmacy if further assistance is needed.   Romeo Rabon, PharmD. Mobile: [phone]. 04/22/2018,8:26 AM.

## 2018-04-22 NOTE — Progress Notes (Addendum)
Daily Rounding Note  04/22/2018, 10:39 AM  LOS: 4 days   SUBJECTIVE:   Chief complaint     Remains confused.  Refusing oral meds, refusing meals.  Becoming agitated with staff at attempts to feed patient Yesterday's hemodialysis session ended early, after 2 hours when patient's husband refused to consent patient for HD.  Apparently the husband has issues with hemodialysis because the patient did not tolerate this in the past. BM's x 2 in last 12 hours, 1 overnight, 1 this AM.     OBJECTIVE:         Vital signs in last 24 hours:    Temp:  [97.5 F (36.4 C)-99 F (37.2 C)] 97.5 F (36.4 C) (04/03 0918) Pulse Rate:  [82-102] 102 (04/03 0918) Resp:  [13-18] 14 (04/03 0918) BP: (91-140)/(42-92) 140/88 (04/03 0918) SpO2:  [99 %-100 %] 99 % (04/03 0918) Weight:  [114 kg-115.6 kg] 115.6 kg (04/03 0400) Last BM Date: 04/21/18 Filed Weights   04/21/18 1907 04/21/18 2150 04/22/18 0400  Weight: 115.4 kg 114 kg 115.6 kg   General: Laying on bed with her eyes open, unresponsive to voice.  No scleral icterus. Heart: RRR. Chest: Clear bilaterally.  No labored breathing.  No cough. Abdomen: Soft.  Obese.  Not tender or distended. Extremities: No CCE. Neuro/Psych: Mute.  Not moving during my exam but moving and slapping at staff earlier today.  Intake/Output from previous day: 04/02 0701 - 04/03 0700 In: 75 [P.O.:75] Out: 75   Intake/Output this shift: No intake/output data recorded.  Lab Results: Recent Labs    04/20/18 0025 04/21/18 0822 04/22/18 0316  WBC 4.3 9.5 11.1*   10.8*  HGB 12.8 12.2 11.6*   11.5*  HCT 37.3 34.4* 34.6*   34.0*  PLT 184 217 193   193   BMET Recent Labs    04/20/18 0025 04/21/18 0822 04/22/18 0316  NA 130* 136 139  K 2.9* 2.7* 3.4*  CL 91* 99 100  CO2 18* 21* 21*  GLUCOSE 141* 170* 97  BUN 42* 39* 20  CREATININE 17.02* 16.33* 11.18*  CALCIUM 7.1* 7.4* 7.8*   LFT Recent Labs   04/20/18 0025 04/21/18 0822 04/22/18 0316  PROT 5.8* 5.6* 5.7*  ALBUMIN 2.5* 2.3* 2.9*  AST 464* 533* 426*  ALT 18 30 27   ALKPHOS 145* 129* 140*  BILITOT 0.9 0.9 1.4*   PT/INR Recent Labs    04/21/18 0512  LABPROT 16.0*  INR 1.3*   Hepatitis Panel Recent Labs    04/20/18 0025  HEPBSAG Negative  HCVAB <0.1  HEPAIGM Negative  HEPBIGM Negative    Studies/Results: Mr Brain Wo Contrast  Result Date: 04/21/2018 CLINICAL DATA:  Initial evaluation for acute altered mental status, encephalopathy. EXAM: MRI HEAD WITHOUT CONTRAST TECHNIQUE: Multiplanar, multiecho pulse sequences of the brain and surrounding structures were obtained without intravenous contrast. COMPARISON:  Prior MRI from 08/14/2016. FINDINGS: Brain: Examination technically limited as the patient was unable to tolerate the full length of the exam. DWI sequences with axial FLAIR sequence only was performed. Additionally, images provided are somewhat degraded by motion artifact. Cerebral volume within normal limits for age. Few scattered patchy subcentimeter FLAIR hyperintensities noted within the supratentorial cerebral white matter, nonspecific, but fairly mild for age and of doubtful significance in the acute setting. No mass lesion, midline shift, or mass effect. No hydrocephalus. No extra-axial fluid collection. No appreciable areas of chronic infarction. No imaging findings to suggest acute intracranial  hemorrhage on this limited exam. No evidence for acute or subacute ischemia on DWI sequences. Vascular: Not well assessed on this limited exam. Skull and upper cervical spine: Calvarium intact. No focal marrow replacing lesion. No scalp soft tissue abnormality. Sinuses/Orbits: Globes orbital soft tissues grossly unremarkable. Paranasal sinuses are largely clear. No appreciable mastoid effusion. Other: None. IMPRESSION: 1. Technically limited exam due to patient's inability to tolerate the full length of the study. 2. Grossly  negative MRI appearance of the brain. No evidence for acute intracranial infarct or other definite abnormality. Electronically Signed   By: Jeannine Boga M.D.   On: 04/21/2018 13:35   Scheduled Meds:  aspirin EC  81 mg Oral Daily   calcium acetate  1,334 mg Oral TID WC   Chlorhexidine Gluconate Cloth  6 each Topical Q0600   ferric citrate  210 mg Oral TID WC   folic acid  1 mg Oral Daily   gentamicin cream  1 application Topical Daily   guaiFENesin  600 mg Oral BID   heparin  5,000 Units Subcutaneous Q8H   insulin aspart  0-5 Units Subcutaneous QHS   insulin aspart  0-9 Units Subcutaneous TID WC   lactulose  20 g Oral TID   midodrine  10 mg Oral BID WC   pantoprazole  40 mg Oral Daily   thiamine injection  100 mg Intravenous Daily   Continuous Infusions:  sodium chloride 500 mL (04/18/18 1326)   albumin human Stopped (04/22/18 0554)   ceFEPime (MAXIPIME) IV 1 g (04/21/18 2347)   dialysis solution 1.5% low-MG/low-CA     sodium chloride     PRN Meds:.sodium chloride, acetaminophen, albumin human, albuterol, camphor-menthol, ondansetron **OR** ondansetron (ZOFRAN) IV, sodium chloride   ASSESMENT:   *   Elevated AST, alkaline phosphatase.  CT scan unrevealing.  Previous cholecystectomy and sleeve gastrectomy. Underlying NASH cirrhosis.    *   AMS.  ? Due to azotemia from insufficient peritoneal dialysis.  ? Component of HE though Ammonia not that high, on Lactulose enemas >> oral lactulose. Ammonia 48 >> 16.   04/21/2018 brain MRI technically limited due to intolerance but grossly negative study.  *   ESRD.  Suspect under dialyzed on PD, started HD 04/21/2011.  Note improvement in the creatinine.  Medical staff trying to convince patient's husband to consent to more hemodialysis sessions.  *    Recent, now resolved, peritonitis.  *  Hypokalemia improved.     PLAN   *   Hopefully can proceed with plans to continue hemodialysis sessions. Continue oral  lactulose but if she refuses oral meds, will need to go back to lactulose enemas.    Brenda Davis  04/22/2018, 10:39 AM Phone [phone]   ________________________________________________________________________  Velora Heckler GI MD note:  I personally examined the patient, reviewed the data and agree with the assessment and plan described above.  I do not think that her mental status changes are due to hepatic encephalopathy.  Will follow along.   Owens Loffler, MD [facility] Pager [phone]

## 2018-04-22 NOTE — Progress Notes (Addendum)
PROGRESS NOTE        PATIENT DETAILS Name: Brenda Davis Age: 53 y.o. Sex: female Date of Birth: [birth year] Admit Date: 04/18/2018 Admitting Physician Kerney Elbe, DO VCB:SWHQP, Arvid Right, MD  Brief Narrative: Patient is a 53 y.o. female history of ESRD on PD, HTN, dyslipidemia, DM-2-recent history of staph ordinary peritonitis (completed IV Ancef on 3/25),liver cirrhoses 2/2 NASH-presenting to the hospital with abdominal pain and fever.  Initially there was some concern for peritonitis-and patient was started on empiric antimicrobial therapy, however peritoneal fluid analysis was not consistent with peritonitis.  Hospital course complicated by development of acute metabolic encephalopathy.  See below for further details  Subjective: Much more awake today-sometimes actually is able to track my movement.  Blinks to startle response.  Withdraws extremities to pain.  Attempts to speak with a vigorous sternal rub-voice is very soft-she mostly mumbled.  Assessment/Plan: Sepsis: Sepsis physiology has improved.  Etiology unclear-initially concern for peritonitis given abdominal pain-but peritoneal fluid analysis not consistent with peritonitis.  Blood cultures negative, CT abdomen without any other source of infection, chest x-ray without any infection.  Respiratory virus panel negative.  Has now been afebrile for almost 3 days-vancomycin/Flagyl was discontinued previously-since no foci of infection evident-we will go ahead and discontinue cefepime today and just monitor.   Acute metabolic encephalopathy: Etiology unclear-but suspicion that she may be uremic due to under dialysis with PD.  Some concern for hepatic encephalopathy-but with normal ammonia levels today-after getting lactulose enemas for the past few days-I doubt that she has hepatic encephalopathy-as she is still confused.  Yesterday husband was very reluctant to consent to HD-and patient only  received 2 hours of HD-I had a long discussion with the patient's husband today, explained the importance of resuming HD as a therapeutic tool to see if she gets better.  He has agreed-nephrology will continue to follow and manage HD care. MRI brain negative for acute abnormalities-EEG negative for seizures.  Recent thiamine level in February 2020 was within normal limits, TSH/Vit B12,normal. RPR/HIV pending.Continue lactulose enema-continue attempts at HD-we will reach out to family later today.  Have consulted neurology today.  Abdominal pain: Unclear etiology-seems to have resolved-abdomen is soft-but difficult to evaluate given encephalopathy.  She does have a isolated elevation in her AST-given abdominal pain-she could have passed a bile duct stone-however lipase/bilirubin levels and other LFTs are within normal limits.  RUQ ultrasound did not show any obvious abnormalities.  CT of the abdomen also did not show acute abnormalities.   Isolated elevation in AST: Unclear etiology-RUQ ultrasound without any obvious pathology-apart from possible cirrhosis.  Patient is s/p cholecystectomy.  AST seems to have plateaued and now downtrending.  GI following.  Acute hepatitis serology negative.    Hypokalemia: Continue to replete and recheck  ESRD on peritoneal dialysis: See above regarding plans to transition to hemodialysis-Per nephrology  History of liver cirrhosis secondary to NASH: See above regarding concerns about hepatic encephalopathy  CAD: No obvious anginal symptoms-continue aspirin  DM-2: CBG stable-continue with SSI  HTN: Blood pressure stable-continue to hold Coreg  Dyslipidemia: Continue to hold statin due to elevated AST  Morbid obesity-s/p gastric sleeve surgery  DVT Prophylaxis: Prophylactic Heparin   Code Status: Full code  Family Communication: Spouse over the phone on 4/3-explained importance of HD as a therapeutic tool to see if her mental status  improved.  Updated him  regarding MRI brain results.  Disposition Plan: Remain inpatient  Antimicrobial agents: Anti-infectives (From admission, onward)   Start     Dose/Rate Route Frequency Ordered Stop   04/21/18 1209  vancomycin variable dose per unstable renal function (pharmacist dosing)  Status:  Discontinued      Does not apply See admin instructions 04/21/18 1211 04/21/18 1314   04/19/18 1400  ceFEPIme (MAXIPIME) 1 g in sodium chloride 0.9 % 100 mL IVPB     1 g 200 mL/hr over 30 Minutes Intravenous Every 24 hours 04/18/18 1205     04/18/18 2200  metroNIDAZOLE (FLAGYL) IVPB 500 mg  Status:  Discontinued     500 mg 100 mL/hr over 60 Minutes Intravenous Every 8 hours 04/18/18 1617 04/21/18 1133   04/18/18 1630  metroNIDAZOLE (FLAGYL) IVPB 500 mg     500 mg 100 mL/hr over 60 Minutes Intravenous  Once 04/18/18 1615 04/18/18 1959   04/18/18 1615  ceFEPIme (MAXIPIME) 2 g in sodium chloride 0.9 % 100 mL IVPB     2 g 200 mL/hr over 30 Minutes Intravenous  Once 04/18/18 1615 04/18/18 2008   04/18/18 1215  vancomycin (VANCOCIN) 2,500 mg in sodium chloride 0.9 % 500 mL IVPB     2,500 mg 250 mL/hr over 120 Minutes Intravenous  Once 04/18/18 1203 04/18/18 1527   04/18/18 1130  ceFEPIme (MAXIPIME) 2 g in sodium chloride 0.9 % 100 mL IVPB  Status:  Discontinued     2 g 200 mL/hr over 30 Minutes Intravenous  Once 04/18/18 1120 04/18/18 1614   04/18/18 1130  metroNIDAZOLE (FLAGYL) IVPB 500 mg  Status:  Discontinued     500 mg 100 mL/hr over 60 Minutes Intravenous  Once 04/18/18 1120 04/18/18 1614   04/18/18 1130  vancomycin (VANCOCIN) IVPB 1000 mg/200 mL premix  Status:  Discontinued     1,000 mg 200 mL/hr over 60 Minutes Intravenous  Once 04/18/18 1120 04/18/18 1126   04/18/18 1130  vancomycin (VANCOCIN) 2,000 mg in sodium chloride 0.9 % 500 mL IVPB  Status:  Discontinued     2,000 mg 250 mL/hr over 120 Minutes Intravenous  Once 04/18/18 1126 04/18/18 1203      Procedures: None  CONSULTS:  nephrology    Neurology Gastroenterology  Time spent: 35- minutes-Greater than 50% of this time was spent in counseling, explanation of diagnosis, planning of further management, and coordination of care.  MEDICATIONS: Scheduled Meds:  aspirin EC  81 mg Oral Daily   calcium acetate  1,334 mg Oral TID WC   Chlorhexidine Gluconate Cloth  6 each Topical Q0600   ferric citrate  210 mg Oral TID WC   folic acid  1 mg Oral Daily   gentamicin cream  1 application Topical Daily   guaiFENesin  600 mg Oral BID   heparin  5,000 Units Subcutaneous Q8H   insulin aspart  0-5 Units Subcutaneous QHS   insulin aspart  0-9 Units Subcutaneous TID WC   lactulose  20 g Oral TID   midodrine  10 mg Oral BID WC   pantoprazole  40 mg Oral Daily   thiamine injection  100 mg Intravenous Daily   Continuous Infusions:  sodium chloride 500 mL (04/18/18 1326)   albumin human Stopped (04/22/18 0554)   ceFEPime (MAXIPIME) IV 1 g (04/21/18 2347)   dialysis solution 1.5% low-MG/low-CA     sodium chloride     PRN Meds:.sodium chloride, acetaminophen, albumin human, albuterol, camphor-menthol, ondansetron **OR**  ondansetron (ZOFRAN) IV, sodium chloride   PHYSICAL EXAM: Vital signs: Vitals:   04/22/18 0013 04/22/18 0400 04/22/18 0419 04/22/18 0918  BP: 120/78  (!) 137/92 140/88  Pulse: 94  100 (!) 102  Resp: 13  18 14   Temp: 97.8 F (36.6 C)  99 F (37.2 C) (!) 97.5 F (36.4 C)  TempSrc:   Oral   SpO2: 100%  100% 99%  Weight:  115.6 kg    Height:       Filed Weights   04/21/18 1907 04/21/18 2150 04/22/18 0400  Weight: 115.4 kg 114 kg 115.6 kg   Body mass index is 42.41 kg/m.   General appearance: Actually more awake and alert compared to yesterday-mumbled a few words softly when I did a vigorous sternal rub.  Not in any distress.  Tracks my movements at times. Eyes:no scleral icterus. HEENT: Atraumatic and Normocephalic Neck: supple, no JVD. Resp:Good air entry bilaterally,no rales or  rhonchi CVS: S1 S2 regular, no murmurs.  GI: Bowel sounds present, Non tender and not distended with no gaurding, rigidity or rebound. Extremities: B/L Lower Ext shows no edema, both legs are warm to touch Neurology: Moves all 4 extremities-but difficult exam.  Has some intermittent myoclonus at times. Musculoskeletal:No digital cyanosis Skin:No Rash, warm and dry Wounds:N/A  I have personally reviewed following labs and imaging studies  LABORATORY DATA: CBC: Recent Labs  Lab 04/18/18 1143 04/19/18 0521 04/20/18 0025 04/21/18 0822 04/22/18 0316  WBC 4.7 3.7* 4.3 9.5 11.1*   10.8*  NEUTROABS 3.7 2.6 2.8  --  8.7*  HGB 12.0 11.3* 12.8 12.2 11.6*   11.5*  HCT 37.1 35.2* 37.3 34.4* 34.6*   34.0*  MCV 98.7 97.8 96.4 93.5 94.0   93.2  PLT 205 181 184 217 193   026    Basic Metabolic Panel: Recent Labs  Lab 04/18/18 1435 04/19/18 0521 04/20/18 0007 04/20/18 0025 04/21/18 0822 04/22/18 0316  NA  --  131* 132* 130* 136 139  K  --  2.9* 2.8* 2.9* 2.7* 3.4*  CL  --  92* 94* 91* 99 100  CO2  --  20* 15* 18* 21* 21*  GLUCOSE  --  89 141* 141* 170* 97  BUN  --  42* 44* 42* 39* 20  CREATININE  --  16.56* 16.88* 17.02* 16.33* 11.18*  CALCIUM  --  6.6* 7.0* 7.1* 7.4* 7.8*  MG 1.5*  --  1.7 1.7  --  1.8  PHOS 4.5  --   --   --   --   --     GFR: Estimated Creatinine Clearance: 7.4 mL/min (A) (by C-G formula based on SCr of 11.18 mg/dL (H)).  Liver Function Tests: Recent Labs  Lab 04/18/18 1143 04/19/18 0521 04/20/18 0025 04/21/18 0822 04/22/18 0316  AST 219* 287* 464* 533* 426*  ALT 8 13 18 30 27   ALKPHOS 150* 129* 145* 129* 140*  BILITOT 0.4 0.9 0.9 0.9 1.4*  PROT 5.5* 5.3* 5.8* 5.6* 5.7*  ALBUMIN 2.3* 2.1* 2.5* 2.3* 2.9*   Recent Labs  Lab 04/18/18 1143  LIPASE 26   Recent Labs  Lab 04/19/18 1133 04/20/18 0007 04/21/18 0512 04/22/18 0316  AMMONIA 40* 43* 48* 16    Coagulation Profile: Recent Labs  Lab 04/21/18 0512  INR 1.3*    Cardiac  Enzymes: No results for input(s): CKTOTAL, CKMB, CKMBINDEX, TROPONINI in the last 168 hours.  BNP (last 3 results) No results for input(s): PROBNP in the last 8760 hours.  HbA1C: No results for input(s): HGBA1C in the last 72 hours.  CBG: Recent Labs  Lab 04/21/18 1155 04/21/18 1351 04/21/18 1703 04/21/18 2218 04/22/18 0812  GLUCAP 106* 97 101* 124* 94    Lipid Profile: No results for input(s): CHOL, HDL, LDLCALC, TRIG, CHOLHDL, LDLDIRECT in the last 72 hours.  Thyroid Function Tests: No results for input(s): TSH, T4TOTAL, FREET4, T3FREE, THYROIDAB in the last 72 hours.  Anemia Panel: Recent Labs    04/22/18 0316  VITAMINB12 1,407*    Urine analysis:    Component Value Date/Time   COLORURINE COLORLESS (A) 04/18/2018 1900   APPEARANCEUR CLEAR 04/18/2018 1900   LABSPEC 1.005 04/18/2018 1900   PHURINE 8.0 04/18/2018 1900   GLUCOSEU >=500 (A) 04/18/2018 1900   GLUCOSEU >=1000 (A) 10/28/2017 0839   HGBUR SMALL (A) 04/18/2018 1900   HGBUR negative 09/17/2009 0000   BILIRUBINUR NEGATIVE 04/18/2018 1900   KETONESUR NEGATIVE 04/18/2018 1900   PROTEINUR 30 (A) 04/18/2018 1900   UROBILINOGEN 0.2 10/28/2017 0839   NITRITE NEGATIVE 04/18/2018 1900   LEUKOCYTESUR NEGATIVE 04/18/2018 1900    Sepsis Labs: Lactic Acid, Venous    Component Value Date/Time   LATICACIDVEN 1.4 04/18/2018 1821    MICROBIOLOGY: Recent Results (from the past 932 hour(s))  Helicobacter pylori special antigen     Status: None   Collection Time: 04/18/18  8:06 AM  Result Value Ref Range Status   MICRO NUMBER: [id number]  Final   SPECIMEN QUALITY Adequate  Final   SOURCE: STOOL  Final   STATUS: FINAL  Final   RESULT:   Final    Not Detected  Antimicrobials, proton pump inhibitors, and bismuth preparations inhibit H. pylori and ingestion up to two weeks prior to testing may cause false negative results. If clinically indicated the test should be repeated on a new specimen  obtained two weeks  after discontinuing treatment.   Blood Culture (routine x 2)     Status: None (Preliminary result)   Collection Time: 04/18/18 11:32 AM  Result Value Ref Range Status   Specimen Description BLOOD RIGHT FOREARM  Final   Special Requests   Final    BOTTLES DRAWN AEROBIC AND ANAEROBIC Blood Culture adequate volume   Culture   Final    NO GROWTH 3 DAYS Performed at [facility], [address], [place], [place] [address]    Report Status PENDING  Incomplete  Blood Culture (routine x 2)     Status: None (Preliminary result)   Collection Time: 04/18/18 11:40 AM  Result Value Ref Range Status   Specimen Description BLOOD RIGHT ANTECUBITAL  Final   Special Requests   Final    BOTTLES DRAWN AEROBIC AND ANAEROBIC Blood Culture adequate volume   Culture   Final    NO GROWTH 3 DAYS Performed at [facility], [place] [address], [place], [place] [address]    Report Status PENDING  Incomplete  Culture, body fluid-bottle     Status: None (Preliminary result)   Collection Time: 04/18/18  1:47 PM  Result Value Ref Range Status   Specimen Description PERITONEAL CAVITY  Final   Special Requests NONE  Final   Culture   Final    NO GROWTH 3 DAYS Performed at [facility], [address] [place], [place] [address]    Report Status PENDING  Incomplete  Gram stain     Status: None   Collection Time: 04/18/18  1:47 PM  Result Value Ref Range Status  Specimen Description PERITONEAL CAVITY  Final   Special Requests NONE  Final   Gram Stain   Final    WBC PRESENT,BOTH PMN AND MONONUCLEAR NO ORGANISMS SEEN CYTOSPIN SMEAR Performed at [facility], [address] [place], [place] [address]    Report Status 04/18/2018 FINAL  Final  MRSA PCR Screening     Status: None   Collection Time: 04/18/18  2:37 PM  Result Value Ref Range Status   MRSA by PCR NEGATIVE NEGATIVE Final    Comment:        The GeneXpert MRSA Assay (FDA approved for NASAL specimens only), is one component  of a comprehensive MRSA colonization surveillance program. It is not intended to diagnose MRSA infection nor to guide or monitor treatment for MRSA infections. Performed at [facility], [place] [address], [place], [place] [address]   Respiratory Panel by PCR     Status: None   Collection Time: 04/19/18 11:05 AM  Result Value Ref Range Status   Adenovirus NOT DETECTED NOT DETECTED Final   Coronavirus 229E NOT DETECTED NOT DETECTED Final    Comment: (NOTE) The Coronavirus on the Respiratory Panel, DOES NOT test for the novel  Coronavirus (2019 nCoV)    Coronavirus HKU1 NOT DETECTED NOT DETECTED Final   Coronavirus NL63 NOT DETECTED NOT DETECTED Final   Coronavirus OC43 NOT DETECTED NOT DETECTED Final   Metapneumovirus NOT DETECTED NOT DETECTED Final   Rhinovirus / Enterovirus NOT DETECTED NOT DETECTED Final   Influenza A NOT DETECTED NOT DETECTED Final   Influenza B NOT DETECTED NOT DETECTED Final   Parainfluenza Virus 1 NOT DETECTED NOT DETECTED Final   Parainfluenza Virus 2 NOT DETECTED NOT DETECTED Final   Parainfluenza Virus 3 NOT DETECTED NOT DETECTED Final   Parainfluenza Virus 4 NOT DETECTED NOT DETECTED Final   Respiratory Syncytial Virus NOT DETECTED NOT DETECTED Final   Bordetella pertussis NOT DETECTED NOT DETECTED Final   Chlamydophila pneumoniae NOT DETECTED NOT DETECTED Final   Mycoplasma pneumoniae NOT DETECTED NOT DETECTED Final    Comment: Performed at [facility], [place]. [address] [place], [place] [address]    RADIOLOGY STUDIES/RESULTS: Ct Abdomen Pelvis Wo Contrast  Result Date: 04/18/2018 CLINICAL DATA:  Fever a few days as recently completed treatment for staph peritonitis with last dose of Ancef given 04/13/2018. Fever this morning with vomiting and abdominal pain over the weekend. Patient with end-stage renal disease post dialysis this morning. EXAM: CT ABDOMEN AND PELVIS WITHOUT CONTRAST TECHNIQUE: Multidetector CT imaging of the abdomen and  pelvis was performed following the standard protocol without IV contrast. COMPARISON:  04/09/2018, 03/27/2018 and 11/11/2017 as well as 02/05/2015 FINDINGS: Lower chest: Minimal linear atelectasis/scarring left base. Small sliding hiatal hernia. Hepatobiliary: Previous cholecystectomy. Liver and biliary tree are within normal. Pancreas: Normal. Spleen: Normal. Adrenals/Urinary Tract: Adrenal glands are normal. Kidneys are somewhat small without hydronephrosis or nephrolithiasis. Ureters and bladder are normal. Stomach/Bowel: Surgical sutures over the stomach compatible previous bypass procedure. Small bowel is normal. Appendix is normal. Mild diverticulosis over the descending colon and sigmoid colon. Vascular/Lymphatic: Minimal calcified plaque over the abdominal aorta. No evidence of adenopathy. Reproductive: Normal. Other: Peritoneal dialysis catheter enters the lower abdomen just right of midline. Tip over the midline pelvis. Mild amount of free peritoneal fluid likely related to patient's peritoneal dialysis. Tiny amount of free peritoneal air also likely related to patient's peritoneal dialysis as this is unchanged. No focal inflammatory process. Musculoskeletal: Unchanged. IMPRESSION: Mild  amount of free peritoneal fluid and tiny amount of free peritoneal air likely related patient's peritoneal dialysis. Dialysis catheter over the midline pelvis without significant change. Diverticulosis of the colon without active inflammation. Postsurgical changes as described. Electronically Signed   By: Marin Olp M.D.   On: 04/18/2018 15:50   X-ray Chest Pa And Lateral  Result Date: 04/19/2018 CLINICAL DATA:  Shortness of breath, nausea EXAM: CHEST - 2 VIEW COMPARISON:  04/18/2018 FINDINGS: Linear left base atelectasis or scarring. Heart is upper limits normal in size. Right lung clear. No effusions or acute bony abnormality. IMPRESSION: Left basilar atelectasis or scarring. Electronically Signed   By: Rolm Baptise M.D.   On: 04/19/2018 07:47   Mr Brain Wo Contrast  Result Date: 04/21/2018 CLINICAL DATA:  Initial evaluation for acute altered mental status, encephalopathy. EXAM: MRI HEAD WITHOUT CONTRAST TECHNIQUE: Multiplanar, multiecho pulse sequences of the brain and surrounding structures were obtained without intravenous contrast. COMPARISON:  Prior MRI from 08/14/2016. FINDINGS: Brain: Examination technically limited as the patient was unable to tolerate the full length of the exam. DWI sequences with axial FLAIR sequence only was performed. Additionally, images provided are somewhat degraded by motion artifact. Cerebral volume within normal limits for age. Few scattered patchy subcentimeter FLAIR hyperintensities noted within the supratentorial cerebral white matter, nonspecific, but fairly mild for age and of doubtful significance in the acute setting. No mass lesion, midline shift, or mass effect. No hydrocephalus. No extra-axial fluid collection. No appreciable areas of chronic infarction. No imaging findings to suggest acute intracranial hemorrhage on this limited exam. No evidence for acute or subacute ischemia on DWI sequences. Vascular: Not well assessed on this limited exam. Skull and upper cervical spine: Calvarium intact. No focal marrow replacing lesion. No scalp soft tissue abnormality. Sinuses/Orbits: Globes orbital soft tissues grossly unremarkable. Paranasal sinuses are largely clear. No appreciable mastoid effusion. Other: None. IMPRESSION: 1. Technically limited exam due to patient's inability to tolerate the full length of the study. 2. Grossly negative MRI appearance of the brain. No evidence for acute intracranial infarct or other definite abnormality. Electronically Signed   By: Jeannine Boga M.D.   On: 04/21/2018 13:35   Ct Abdomen Pelvis W Contrast  Result Date: 04/09/2018 CLINICAL DATA:  Nausea and vomiting and abdominal pain EXAM: CT ABDOMEN AND PELVIS WITH CONTRAST TECHNIQUE:  Multidetector CT imaging of the abdomen and pelvis was performed using the standard protocol following bolus administration of intravenous contrast. CONTRAST:  122m OMNIPAQUE IOHEXOL 300 MG/ML  SOLN COMPARISON:  03/27/2018 FINDINGS: Lower chest: No acute abnormality. Hepatobiliary: Diffuse fatty infiltration of the liver is noted. No focal mass is seen. The gallbladder has been surgically removed. Pancreas: Unremarkable. No pancreatic ductal dilatation or surrounding inflammatory changes. Spleen: Normal in size without focal abnormality. Adrenals/Urinary Tract: Adrenal glands are within normal limits. Renal vascular calcifications are noted. No obstructive changes are seen. The ureters are within normal limits bilaterally. The bladder is decompressed. Stomach/Bowel: Scattered diverticular changes noted within the colon. No evidence of diverticulitis is seen. The appendix is within normal limits. No obstructive or inflammatory changes of the small bowel are noted. Changes of prior sleeve gastrectomy are seen. Small sliding-type hiatal hernia is noted. Vascular/Lymphatic: Aortic atherosclerosis. No enlarged abdominal or pelvic lymph nodes. Reproductive: Uterus and bilateral adnexa are unremarkable. Other: Peritoneal dialysis catheter is noted deep within the pelvis. Small amount of free fluid is noted within the pelvis associated with the catheter. Additionally there are a few foci of air identified  in the upper abdomen adjacent to the spleen and stomach. These are likely related to the peritoneal dialysis catheter. By history the patient is receiving treatment for peritonitis. Musculoskeletal: No acute bony abnormality is seen. IMPRESSION: Minimal free air and free fluid within the abdomen likely related to the patient's known peritoneal dialysis catheter. No definitive evidence to suggest perforation are seen. Diverticulosis without diverticulitis. Fatty liver. Electronically Signed   By: Inez Catalina M.D.   On:  04/09/2018 16:50   Ct Abdomen Pelvis W Contrast  Result Date: 03/27/2018 CLINICAL DATA:  Lower abdominal pain starting Friday. Patient does dialysis every day at home. Prior gastric sleeve, peritoneal catheter placement and cholecystectomy. EXAM: CT ABDOMEN AND PELVIS WITH CONTRAST TECHNIQUE: Multidetector CT imaging of the abdomen and pelvis was performed using the standard protocol following bolus administration of intravenous contrast. CONTRAST:  155m OMNIPAQUE IOHEXOL 300 MG/ML  SOLN COMPARISON:  11/11/2017 CT FINDINGS: Lower chest: Top-normal heart size with coronary arteriosclerosis. No pericardial effusion or thickening. Clear lung bases with left basilar atelectasis. Hepatobiliary: Steatosis of the liver. More focal fatty infiltration adjacent to the falciform ligament. Surface nodularity of the liver compatible morphologic changes of cirrhosis. No space-occupying mass of the liver is seen. The patient is status post cholecystectomy. Pancreas: No pancreatic mass or ductal dilatation.  No inflammation. Spleen: Normal Adrenals/Urinary Tract: Normal bilateral adrenal glands. Mild bilateral renal atrophy and cortical thinning. No obstructive uropathy, mass or nephrolithiasis. The urinary bladder is unremarkable for the degree of distention. It is slightly thick-walled but this is likely due to underdistention. Cystitis is believed less likely. Stomach/Bowel: Gastric sleeve procedure without complicating features. Small hiatal hernia is noted. Normal small bowel rotation without obstruction or inflammation. Sigmoid diverticulosis without acute diverticulitis. The appendix is normal caliber with probable small amount of retained contrast noted. Vascular/Lymphatic: Aortoiliac atherosclerosis. No adenopathy by CT size criteria. Reproductive: Uterus and bilateral adnexa are unremarkable. Other: Dialysis catheter is seen coiled within the mid pelvis. Musculoskeletal: No acute nor suspicious osseous abnormality.  IMPRESSION: 1. Steatosis of the liver with slight morphologic changes of cirrhosis given surface nodularity of the liver. No space-occupying mass is seen. 2. Status post cholecystectomy. 3. Gastric sleeve procedure without complicating features. 4. Sigmoid diverticulosis without acute diverticulitis. 5. Percutaneous peritoneal dialysis catheter is seen coiled pelvis without complicating features. Electronically Signed   By: DAshley RoyaltyM.D.   On: 03/27/2018 20:55   Dg Chest Port 1 View  Result Date: 04/18/2018 CLINICAL DATA:  Fever EXAM: PORTABLE CHEST 1 VIEW COMPARISON:  03/16/2018 FINDINGS: The heart size and mediastinal contours are within normal limits. Both lungs are clear. The visualized skeletal structures are unremarkable. IMPRESSION: No active disease. Electronically Signed   By: HKathreen Devoid  On: 04/18/2018 11:31   UKoreaAbdomen Limited Ruq  Result Date: 04/19/2018 CLINICAL DATA:  Abnormal LFTs. EXAM: ULTRASOUND ABDOMEN LIMITED RIGHT UPPER [facility] COMPARISON:  CT abdomen pelvis from yesterday. FINDINGS: Gallbladder: Surgically absent. Common bile duct: Diameter: 4 mm, normal. Liver: No focal lesion identified. Subtle nodular contour. Coarsened, heterogeneous parenchymal echogenicity. Portal vein is patent on color Doppler imaging with normal direction of blood flow towards the liver. IMPRESSION: 1. Subtle liver surface nodularity with coarsened, heterogeneous parenchymal echogenicity, suspicious for cirrhosis. Electronically Signed   By: WTitus DubinM.D.   On: 04/19/2018 11:40     LOS: 4 days   SOren Binet MD  [facility]  If 7PM-7AM, please contact night-coverage  Please page via [email or web address]  Go to [email or web address] and  use [place]'s universal password to access. If you do not have the password, please contact the hospital operator.  Locate the [facility] provider you are looking for under [facility] and page to a number that you can be directly reached. If you still  have difficulty reaching the provider, please page the [facility] (Director on Call) for the Hospitalists listed on amion for assistance.  04/22/2018, 10:58 AM

## 2018-04-23 ENCOUNTER — Encounter (HOSPITAL_COMMUNITY): Payer: Self-pay | Admitting: Nephrology

## 2018-04-23 LAB — CBC
HCT: 33.4 % — ABNORMAL LOW (ref 36.0–46.0)
Hemoglobin: 11.4 g/dL — ABNORMAL LOW (ref 12.0–15.0)
MCH: 31.8 pg (ref 26.0–34.0)
MCHC: 34.1 g/dL (ref 30.0–36.0)
MCV: 93.3 fL (ref 80.0–100.0)
Platelets: 184 10*3/uL (ref 150–400)
RBC: 3.58 MIL/uL — ABNORMAL LOW (ref 3.87–5.11)
RDW: 14.8 % (ref 11.5–15.5)
WBC: 10.5 10*3/uL (ref 4.0–10.5)
nRBC: 0.2 % (ref 0.0–0.2)

## 2018-04-23 LAB — COMPREHENSIVE METABOLIC PANEL
ALT: 24 U/L (ref 0–44)
AST: 290 U/L — ABNORMAL HIGH (ref 15–41)
Albumin: 2.8 g/dL — ABNORMAL LOW (ref 3.5–5.0)
Alkaline Phosphatase: 143 U/L — ABNORMAL HIGH (ref 38–126)
Anion gap: 17 — ABNORMAL HIGH (ref 5–15)
BUN: 10 mg/dL (ref 6–20)
CO2: 22 mmol/L (ref 22–32)
Calcium: 8 mg/dL — ABNORMAL LOW (ref 8.9–10.3)
Chloride: 101 mmol/L (ref 98–111)
Creatinine, Ser: 6.37 mg/dL — ABNORMAL HIGH (ref 0.44–1.00)
GFR calc Af Amer: 8 mL/min — ABNORMAL LOW (ref >60)
GFR calc non Af Amer: 7 mL/min — ABNORMAL LOW (ref >60)
Glucose, Bld: 99 mg/dL (ref 70–99)
Potassium: 4.1 mmol/L (ref 3.5–5.1)
Sodium: 140 mmol/L (ref 135–145)
Total Bilirubin: 1.4 mg/dL — ABNORMAL HIGH (ref 0.3–1.2)
Total Protein: 5.9 g/dL — ABNORMAL LOW (ref 6.5–8.1)

## 2018-04-23 LAB — CULTURE, BODY FLUID W GRAM STAIN -BOTTLE: Culture: NO GROWTH

## 2018-04-23 LAB — GLUCOSE, CAPILLARY
Glucose-Capillary: 82 mg/dL (ref 70–99)
Glucose-Capillary: 118 mg/dL — ABNORMAL HIGH (ref 70–99)
Glucose-Capillary: 93 mg/dL (ref 70–99)

## 2018-04-23 LAB — CULTURE, BLOOD (ROUTINE X 2)
Culture: NO GROWTH
Culture: NO GROWTH
Special Requests: ADEQUATE
Special Requests: ADEQUATE

## 2018-04-23 LAB — MAGNESIUM: Magnesium: 1.9 mg/dL (ref 1.7–2.4)

## 2018-04-23 MED ORDER — MIDODRINE HCL 5 MG PO TABS
ORAL_TABLET | ORAL | Status: AC
  Filled 2018-04-23: qty 2

## 2018-04-23 MED ORDER — HEPARIN SODIUM (PORCINE) 1000 UNIT/ML DIALYSIS
4000.0000 [IU] | INTRAMUSCULAR | Status: DC | PRN
  Administered 2018-04-23: 12:00:00 4000 [IU] via INTRAVENOUS_CENTRAL
  Filled 2018-04-23 (×2): qty 4

## 2018-04-23 MED ORDER — HEPARIN SODIUM (PORCINE) 1000 UNIT/ML IJ SOLN
INTRAMUSCULAR | Status: AC
  Administered 2018-04-23: 4000 [IU] via INTRAVENOUS_CENTRAL
  Filled 2018-04-23: qty 4

## 2018-04-23 NOTE — Plan of Care (Signed)
Patient mute and not following commands

## 2018-04-23 NOTE — Progress Notes (Addendum)
Patient ID: Brenda Davis, female   DOB: 1965-10-19, 53 y.o.   MRN: [id number]     Progress Note   Subjective  Alert , but not speaking - not taking meds or food   Nurse reports pt scratching herself repeatedly   Objective   Vital signs in last 24 hours: Temp:  [97.7 F (36.5 C)-98.4 F (36.9 C)] 98.4 F (36.9 C) (04/04 0415) Pulse Rate:  [76-104] 76 (04/04 0100) Resp:  [14-21] 18 (04/04 0100) BP: (81-161)/(50-121) 101/59 (04/04 0100) SpO2:  [96 %-100 %] 99 % (04/04 0100) Weight:  [113.4 kg-114.6 kg] 114.6 kg (04/04 0655) Last BM Date: 04/23/18 General:   AA female in NAD, alert  Heart:  Regular rate and rhythm; no murmurs Lungs: Respirations even and unlabored, lungs CTA bilaterally Abdomen:  Soft, nontender and nondistended. Normal bowel sounds. Extremities:  Without edema. Neurologic:  Alert , tracks and follows examiner , no verbal response , some jerking movement sublte    Lab Results: Recent Labs    04/22/18 0316 04/22/18 2213 04/23/18 0306  WBC 11.1*  10.8* 8.5 10.5  HGB 11.6*  11.5* 10.8* 11.4*  HCT 34.6*  34.0* 32.6* 33.4*  PLT 193  193 210 184   BMET Recent Labs    04/22/18 0316 04/22/18 2213 04/23/18 0306  NA 139 137 140  K 3.4* 3.5 4.1  CL 100 100 101  CO2 21* 23 22  GLUCOSE 97 102* 99  BUN 20 20 10   CREATININE 11.18* 9.35* 6.37*  CALCIUM 7.8* 7.9* 8.0*   LFT Recent Labs    04/23/18 0306  PROT 5.9*  ALBUMIN 2.8*  AST 290*  ALT 24  ALKPHOS 143*  BILITOT 1.4*   PT/INR Recent Labs    04/21/18 0512  LABPROT 16.0*  INR 1.3*    Studies/Results: Mr Brain Wo Contrast  Result Date: 04/21/2018 CLINICAL DATA:  Initial evaluation for acute altered mental status, encephalopathy. EXAM: MRI HEAD WITHOUT CONTRAST TECHNIQUE: Multiplanar, multiecho pulse sequences of the brain and surrounding structures were obtained without intravenous contrast. COMPARISON:  Prior MRI from 08/14/2016. FINDINGS: Brain: Examination technically  limited as the patient was unable to tolerate the full length of the exam. DWI sequences with axial FLAIR sequence only was performed. Additionally, images provided are somewhat degraded by motion artifact. Cerebral volume within normal limits for age. Few scattered patchy subcentimeter FLAIR hyperintensities noted within the supratentorial cerebral white matter, nonspecific, but fairly mild for age and of doubtful significance in the acute setting. No mass lesion, midline shift, or mass effect. No hydrocephalus. No extra-axial fluid collection. No appreciable areas of chronic infarction. No imaging findings to suggest acute intracranial hemorrhage on this limited exam. No evidence for acute or subacute ischemia on DWI sequences. Vascular: Not well assessed on this limited exam. Skull and upper cervical spine: Calvarium intact. No focal marrow replacing lesion. No scalp soft tissue abnormality. Sinuses/Orbits: Globes orbital soft tissues grossly unremarkable. Paranasal sinuses are largely clear. No appreciable mastoid effusion. Other: None. IMPRESSION: 1. Technically limited exam due to patient's inability to tolerate the full length of the study. 2. Grossly negative MRI appearance of the brain. No evidence for acute intracranial infarct or other definite abnormality. Electronically Signed   By: Jeannine Boga M.D.   On: 04/21/2018 13:35       Assessment / Plan:     #1 53 yo female  with ESRD on PD at home with recent Peritonitis  admitted with AMS.- Toxic metabolic encephalopathy? Sepsis physiology- cultures  negative to date   Now on HD- Creat improved, still with myoclonus  Pt has underlying NASH cirrhosis , no hx of hepatic encephalopathy , and normal ammonia - on Chronulac enemas . MRI negative EEG neg for seizure activity Neurology to see  #2 elevated AST /alk phos- likely  Reactive  Hepatitis panel neg  Trending down    Plan; discussed with Dr Sloan Leiter - GI will sign off - available  if can help    LOS: 5 days   Brenda Davis  04/23/2018, 10:29 AM   ________________________________________________________________________  Brenda Davis GI MD note:  I personally examined the patient, reviewed the data and agree with the assessment and plan described above.  OK to stop the lactulose enemas as I think it is pretty unlikely she has hepatic encephalopathy.   Owens Loffler, MD [facility] Pager [phone]

## 2018-04-23 NOTE — Progress Notes (Signed)
[facility] Progress Note  Subjective: creat down 6 -7 range, still altered, not respnoding  Vitals:   04/23/18 1330 04/23/18 1400 04/23/18 1430 04/23/18 1500  BP: (!) 108/57 (!) 134/58 108/61 (!) 135/58  Pulse: 86 96 86 89  Resp:      Temp:      TempSrc:      SpO2:      Weight:      Height:        Inpatient medications: . aspirin EC  81 mg Oral Daily  . calcium acetate  1,334 mg Oral TID WC  . Chlorhexidine Gluconate Cloth  6 each Topical Q0600  . Chlorhexidine Gluconate Cloth  6 each Topical Q0600  . Chlorhexidine Gluconate Cloth  6 each Topical Q0600  . ferric citrate  210 mg Oral TID WC  . folic acid  1 mg Oral Daily  . gentamicin cream  1 application Topical Daily  . guaiFENesin  600 mg Oral BID  . heparin  5,000 Units Subcutaneous Q8H  . insulin aspart  0-5 Units Subcutaneous QHS  . insulin aspart  0-9 Units Subcutaneous TID WC  . lactulose  20 g Oral TID  . midodrine  10 mg Oral BID WC  . pantoprazole  40 mg Oral Daily  . thiamine injection  100 mg Intravenous Daily   . sodium chloride 500 mL (04/18/18 1326)  . albumin human Stopped (04/22/18 0554)  . dialysis solution 1.5% low-MG/low-CA    . sodium chloride     sodium chloride, acetaminophen, albumin human, albuterol, camphor-menthol, [START ON 04/24/2018] heparin, ondansetron **OR** ondansetron (ZOFRAN) IV, sodium chloride    Exam: General: obese sleeping female obtunded, mild myoclonic jerking   Heart: RRR Lungs: dim BS poor expansion grossly clear Abdomen: obese large pannus nontender Extremities: no sig LE edema Dialysis Access:  PD cath  Dialysis Orders: CCPD edw 116.5 6 exchnage 2.5 L dwell 1.5 hr dry day  Recent hgb 12 ipTH 866 Calcitriol 0.5/day, sensipar 60 qod, Aurixya 2 ac phoslo 3 ac  Assessment/Plan: 1. Fever -Tmax 99.3   hx recent staph warneri peritonitis tx Ancef IP. WBC cell count here only 4 on 3/30 so doubt peritonitis. RVP negative, no positive cultures yet, high  procalcitonin. Fevers gradually have resolved, afeb now. Admission abd CT unrevealing.  2. ESRD on CCPD: new AMS w/ ^^creat on PD, trial of HD underway. See below also. Pt has L AVF.  3. Altered mental status: in conjunction w myoclonus and  ^^creat 15-20 we assumed pt has uremic encephalopathy but not really improving w hemodialysis / lowering solute yet. Creat down 6. HD again today, hopefully will respond.  4. BP/volume  - BP's lower, no edema on exam, at dry wt. No fluid off on HD.   5. Elevated LFTs- AST 216  > 287 > 464 > 533 (was 24 03/23/18 at HD unit) other LFT ok-recent AST had been normal in early March - NH3 48. GI evaluating 6. Anemia ckd  - hgb 11- 12 stable - no ESA/Fe 7. MBD ckd -  Resumed home ca acetate but hold sensipar for now due to marginally low corrected Ca. Ca / phos both in range.  8. Nutrition - alb 2.1 - renal carb mod    [facility] 04/22/2018, 2:02 PM    Iron/TIBC/Ferritin/ %Sat    Component Value Date/Time   IRON 73 03/07/2018 1424   TIBC 290 01/03/2015 1650   FERRITIN >1500.0 (H) 03/07/2018 1424  IRONPCTSAT 29.5 03/07/2018 1424   Recent Labs  Lab 04/21/18 0512  04/22/18 2213 04/23/18 0306  NA  --    < > 137 140  K  --    < > 3.5 4.1  CL  --    < > 100 101  CO2  --    < > 23 22  GLUCOSE  --    < > 102* 99  BUN  --    < > 20 10  CREATININE  --    < > 9.35* 6.37*  CALCIUM  --    < > 7.9* 8.0*  PHOS  --   --  3.2  --   ALBUMIN  --    < > 2.7* 2.8*  INR 1.3*  --   --   --    < > = values in this interval not displayed.   Recent Labs  Lab 04/23/18 0306  AST 290*  ALT 24  ALKPHOS 143*  BILITOT 1.4*  PROT 5.9*   Recent Labs  Lab 04/23/18 0306  WBC 10.5  HGB 11.4*  HCT 33.4*  PLT 184

## 2018-04-23 NOTE — Progress Notes (Signed)
PROGRESS NOTE        PATIENT DETAILS Name: Brenda Davis Age: 53 y.o. Sex: female Date of Birth: 05-02-1965 Admit Date: 04/18/2018 Admitting Physician Kerney Elbe, DO YBW:LSLHT, Arvid Right, MD  Brief Narrative: Patient is a 53 y.o. female history of ESRD on PD, HTN, dyslipidemia, DM-2-recent history of staph ordinary peritonitis (completed IV Ancef on 3/25),liver cirrhoses 2/2 NASH-presenting to the hospital with abdominal pain and fever.  Initially there was some concern for peritonitis-and patient was started on empiric antimicrobial therapy, however peritoneal fluid analysis was not consistent with peritonitis.  Hospital course complicated by development of acute metabolic encephalopathy.  See below for further details  Subjective: Continues to be more awake compared to the past few days but still refuses to speak-but with vigorous sternal rub she appears to mumble something softly.  She will track my movements-will blink her eyes to startle movement  Assessment/Plan: Sepsis: Sepsis physiology has improved.  Etiology unclear-initially concern for peritonitis given abdominal pain-but peritoneal fluid analysis not consistent with peritonitis.  Blood cultures negative, CT abdomen without any other source of infection, chest x-ray without any infection.  Respiratory virus panel negative.  Has now been afebrile for almost 4 days-vancomycin/Flagyl/cefepime has all been discontinued.  She continues to be afebrile.    Acute metabolic encephalopathy: Etiology unclear-but suspicion that she may be uremic due to under dialysis with PD.  Some concern for hepatic encephalopathy-but with normal ammonia levels today-after getting lactulose enemas for the past few days-I doubt that she has hepatic encephalopathy-as she is still confused.  Family/husband was initially reluctant to consent to HD-she finally got 1 HD session done on 4/3-although creatinine has  improved-mental status remains essentially the same.MRI brain negative for acute abnormalities-EEG negative for seizures.  Recent thiamine level in February 2020 was within normal limits, TSH/Vit B12,normal.  HIV/RPR negative.  Neurology also following-we will await further recommendations.  Abdominal pain: Resolved.  Unclear etiology--abdomen is soft-but difficult to evaluate given encephalopathy.  Most imaging studies including RUQ ultrasound, CT of the abdomen without any acute abnormalities.  Continue supportive care  Isolated elevation in AST: Unclear etiology-RUQ ultrasound without any obvious pathology-apart from possible cirrhosis.  Patient is s/p cholecystectomy.  AST seems to have plateaued and now downtrending.  GI following.  Acute hepatitis serology negative.    Hypokalemia: Repleted  ESRD on peritoneal dialysis: See above-has been transitioned to hemodialysis.   History of liver cirrhosis secondary to NASH: See above regarding concerns about hepatic encephalopathy  CAD: No obvious anginal symptoms-continue aspirin  DM-2: CBG stable-continue with SSI  HTN: Blood pressure stable-continue to hold Coreg  Dyslipidemia: Continue to hold statin due to elevated AST  Morbid obesity-s/p gastric sleeve surgery  DVT Prophylaxis: Prophylactic Heparin   Code Status: Full code  Family Communication: Spouse over the phone on 4/4  Disposition Plan: Remain inpatient  Antimicrobial agents: Anti-infectives (From admission, onward)   Start     Dose/Rate Route Frequency Ordered Stop   04/21/18 1209  vancomycin variable dose per unstable renal function (pharmacist dosing)  Status:  Discontinued      Does not apply See admin instructions 04/21/18 1211 04/21/18 1314   04/19/18 1400  ceFEPIme (MAXIPIME) 1 g in sodium chloride 0.9 % 100 mL IVPB  Status:  Discontinued     1 g 200 mL/hr over 30 Minutes Intravenous Every 24  hours 04/18/18 1205 04/22/18 1429   04/18/18 2200  metroNIDAZOLE  (FLAGYL) IVPB 500 mg  Status:  Discontinued     500 mg 100 mL/hr over 60 Minutes Intravenous Every 8 hours 04/18/18 1617 04/21/18 1133   04/18/18 1630  metroNIDAZOLE (FLAGYL) IVPB 500 mg     500 mg 100 mL/hr over 60 Minutes Intravenous  Once 04/18/18 1615 04/18/18 1959   04/18/18 1615  ceFEPIme (MAXIPIME) 2 g in sodium chloride 0.9 % 100 mL IVPB     2 g 200 mL/hr over 30 Minutes Intravenous  Once 04/18/18 1615 04/18/18 2008   04/18/18 1215  vancomycin (VANCOCIN) 2,500 mg in sodium chloride 0.9 % 500 mL IVPB     2,500 mg 250 mL/hr over 120 Minutes Intravenous  Once 04/18/18 1203 04/18/18 1527   04/18/18 1130  ceFEPIme (MAXIPIME) 2 g in sodium chloride 0.9 % 100 mL IVPB  Status:  Discontinued     2 g 200 mL/hr over 30 Minutes Intravenous  Once 04/18/18 1120 04/18/18 1614   04/18/18 1130  metroNIDAZOLE (FLAGYL) IVPB 500 mg  Status:  Discontinued     500 mg 100 mL/hr over 60 Minutes Intravenous  Once 04/18/18 1120 04/18/18 1614   04/18/18 1130  vancomycin (VANCOCIN) IVPB 1000 mg/200 mL premix  Status:  Discontinued     1,000 mg 200 mL/hr over 60 Minutes Intravenous  Once 04/18/18 1120 04/18/18 1126   04/18/18 1130  vancomycin (VANCOCIN) 2,000 mg in sodium chloride 0.9 % 500 mL IVPB  Status:  Discontinued     2,000 mg 250 mL/hr over 120 Minutes Intravenous  Once 04/18/18 1126 04/18/18 1203      Procedures: None  CONSULTS:  nephrology  Neurology Gastroenterology  Time spent: 35- minutes-Greater than 50% of this time was spent in counseling, explanation of diagnosis, planning of further management, and coordination of care.  MEDICATIONS: Scheduled Meds:  aspirin EC  81 mg Oral Daily   calcium acetate  1,334 mg Oral TID WC   Chlorhexidine Gluconate Cloth  6 each Topical Q0600   Chlorhexidine Gluconate Cloth  6 each Topical Q0600   Chlorhexidine Gluconate Cloth  6 each Topical Q0600   ferric citrate  210 mg Oral TID WC   folic acid  1 mg Oral Daily   gentamicin cream  1  application Topical Daily   guaiFENesin  600 mg Oral BID   heparin       heparin  5,000 Units Subcutaneous Q8H   insulin aspart  0-5 Units Subcutaneous QHS   insulin aspart  0-9 Units Subcutaneous TID WC   lactulose  20 g Oral TID   midodrine  10 mg Oral BID WC   pantoprazole  40 mg Oral Daily   thiamine injection  100 mg Intravenous Daily   Continuous Infusions:  sodium chloride 500 mL (04/18/18 1326)   albumin human Stopped (04/22/18 0554)   dialysis solution 1.5% low-MG/low-CA     sodium chloride     PRN Meds:.sodium chloride, acetaminophen, albumin human, albuterol, camphor-menthol, [START ON 04/24/2018] heparin, ondansetron **OR** ondansetron (ZOFRAN) IV, sodium chloride   PHYSICAL EXAM: Vital signs: Vitals:   04/23/18 0030 04/23/18 0100 04/23/18 0415 04/23/18 0655  BP: (!) 81/50 (!) 101/59    Pulse: 89 76    Resp:  18    Temp:  97.7 F (36.5 C) 98.4 F (36.9 C)   TempSrc:  Axillary Oral   SpO2:  99%    Weight:  113.4 kg  114.6 kg  Height:       Filed Weights   04/22/18 2145 04/23/18 0100 04/23/18 0655  Weight: 113.4 kg 113.4 kg 114.6 kg   Body mass index is 42.04 kg/m.   General appearance:Awake, does not speak a word-but will track my movement.  Not in any distress. Eyes:no scleral icterus. HEENT: Atraumatic and Normocephalic Neck: supple, no JVD. Resp:Good air entry bilaterally,no rales or rhonchi CVS: S1 S2 regular, no murmurs.  GI: Bowel sounds present, Non tender and not distended with no gaurding, rigidity or rebound. Extremities: B/L Lower Ext shows no edema, both legs are warm to touch Neurology: Seems to move all 4 extremities. Musculoskeletal:No digital cyanosis Skin:No Rash, warm and dry Wounds:N/A  I have personally reviewed following labs and imaging studies  LABORATORY DATA: CBC: Recent Labs  Lab 04/18/18 1143 04/19/18 0521 04/20/18 0025 04/21/18 4132 04/22/18 0316 04/22/18 2213 04/23/18 0306  WBC 4.7 3.7* 4.3 9.5  11.1*   10.8* 8.5 10.5  NEUTROABS 3.7 2.6 2.8  --  8.7*  --   --   HGB 12.0 11.3* 12.8 12.2 11.6*   11.5* 10.8* 11.4*  HCT 37.1 35.2* 37.3 34.4* 34.6*   34.0* 32.6* 33.4*  MCV 98.7 97.8 96.4 93.5 94.0   93.2 95.3 93.3  PLT 205 181 184 217 193   193 210 440    Basic Metabolic Panel: Recent Labs  Lab 04/18/18 1435  04/20/18 0007 04/20/18 0025 04/21/18 0822 04/22/18 0316 04/22/18 2213 04/23/18 0306  NA  --    < > 132* 130* 136 139 137 140  K  --    < > 2.8* 2.9* 2.7* 3.4* 3.5 4.1  CL  --    < > 94* 91* 99 100 100 101  CO2  --    < > 15* 18* 21* 21* 23 22  GLUCOSE  --    < > 141* 141* 170* 97 102* 99  BUN  --    < > 44* 42* 39* 20 20 10   CREATININE  --    < > 16.88* 17.02* 16.33* 11.18* 9.35* 6.37*  CALCIUM  --    < > 7.0* 7.1* 7.4* 7.8* 7.9* 8.0*  MG 1.5*  --  1.7 1.7  --  1.8  --  1.9  PHOS 4.5  --   --   --   --   --  3.2  --    < > = values in this interval not displayed.    GFR: Estimated Creatinine Clearance: 12.9 mL/min (A) (by C-G formula based on SCr of 6.37 mg/dL (H)).  Liver Function Tests: Recent Labs  Lab 04/19/18 0521 04/20/18 0025 04/21/18 0822 04/22/18 0316 04/22/18 2213 04/23/18 0306  AST 287* 464* 533* 426*  --  290*  ALT 13 18 30 27   --  24  ALKPHOS 129* 145* 129* 140*  --  143*  BILITOT 0.9 0.9 0.9 1.4*  --  1.4*  PROT 5.3* 5.8* 5.6* 5.7*  --  5.9*  ALBUMIN 2.1* 2.5* 2.3* 2.9* 2.7* 2.8*   Recent Labs  Lab 04/18/18 1143  LIPASE 26   Recent Labs  Lab 04/19/18 1133 04/20/18 0007 04/21/18 0512 04/22/18 0316  AMMONIA 40* 43* 48* 16    Coagulation Profile: Recent Labs  Lab 04/21/18 0512  INR 1.3*    Cardiac Enzymes: No results for input(s): CKTOTAL, CKMB, CKMBINDEX, TROPONINI in the last 168 hours.  BNP (last 3 results) No results for input(s): PROBNP in the last 8760 hours.  HbA1C:  No results for input(s): HGBA1C in the last 72 hours.  CBG: Recent Labs  Lab 04/22/18 0812 04/22/18 1153 04/22/18 1719 04/22/18 2034  04/23/18 0813  GLUCAP 94 113* 104* 97 82    Lipid Profile: No results for input(s): CHOL, HDL, LDLCALC, TRIG, CHOLHDL, LDLDIRECT in the last 72 hours.  Thyroid Function Tests: No results for input(s): TSH, T4TOTAL, FREET4, T3FREE, THYROIDAB in the last 72 hours.  Anemia Panel: Recent Labs    04/22/18 0316  VITAMINB12 1,407*    Urine analysis:    Component Value Date/Time   COLORURINE COLORLESS (A) 04/18/2018 1900   APPEARANCEUR CLEAR 04/18/2018 1900   LABSPEC 1.005 04/18/2018 1900   PHURINE 8.0 04/18/2018 1900   GLUCOSEU >=500 (A) 04/18/2018 1900   GLUCOSEU >=1000 (A) 10/28/2017 0839   HGBUR SMALL (A) 04/18/2018 1900   HGBUR negative 09/17/2009 0000   BILIRUBINUR NEGATIVE 04/18/2018 1900   KETONESUR NEGATIVE 04/18/2018 1900   PROTEINUR 30 (A) 04/18/2018 1900   UROBILINOGEN 0.2 10/28/2017 0839   NITRITE NEGATIVE 04/18/2018 1900   LEUKOCYTESUR NEGATIVE 04/18/2018 1900    Sepsis Labs: Lactic Acid, Venous    Component Value Date/Time   LATICACIDVEN 1.4 04/18/2018 1821    MICROBIOLOGY: Recent Results (from the past 300 hour(s))  Helicobacter pylori special antigen     Status: None   Collection Time: 04/18/18  8:06 AM  Result Value Ref Range Status   MICRO NUMBER: [id number]  Final   SPECIMEN QUALITY Adequate  Final   SOURCE: STOOL  Final   STATUS: FINAL  Final   RESULT:   Final    Not Detected  Antimicrobials, proton pump inhibitors, and bismuth preparations inhibit H. pylori and ingestion up to two weeks prior to testing may cause false negative results. If clinically indicated the test should be repeated on a new specimen  obtained two weeks after discontinuing treatment.   Blood Culture (routine x 2)     Status: None (Preliminary result)   Collection Time: 04/18/18 11:32 AM  Result Value Ref Range Status   Specimen Description BLOOD RIGHT FOREARM  Final   Special Requests   Final    BOTTLES DRAWN AEROBIC AND ANAEROBIC Blood Culture adequate volume   Culture    Final    NO GROWTH 4 DAYS Performed at [facility], [address], [place], [place] [address]    Report Status PENDING  Incomplete  Blood Culture (routine x 2)     Status: None (Preliminary result)   Collection Time: 04/18/18 11:40 AM  Result Value Ref Range Status   Specimen Description BLOOD RIGHT ANTECUBITAL  Final   Special Requests   Final    BOTTLES DRAWN AEROBIC AND ANAEROBIC Blood Culture adequate volume   Culture   Final    NO GROWTH 4 DAYS Performed at [facility], [place] [address] [place], [place] [address]    Report Status PENDING  Incomplete  Culture, body fluid-bottle     Status: None (Preliminary result)   Collection Time: 04/18/18  1:47 PM  Result Value Ref Range Status   Specimen Description PERITONEAL CAVITY  Final   Special Requests NONE  Final   Culture   Final    NO GROWTH 4 DAYS Performed at [facility], [address] [place],  [address]    Report Status PENDING  Incomplete  Gram stain     Status: None   Collection Time: 04/18/18  1:47 PM  Result Value Ref Range Status  Specimen Description PERITONEAL CAVITY  Final   Special Requests NONE  Final   Gram Stain   Final    WBC PRESENT,BOTH PMN AND MONONUCLEAR NO ORGANISMS SEEN CYTOSPIN SMEAR Performed at [facility], [address], [place], [place] [address]    Report Status 04/18/2018 FINAL  Final  MRSA PCR Screening     Status: None   Collection Time: 04/18/18  2:37 PM  Result Value Ref Range Status   MRSA by PCR NEGATIVE NEGATIVE Final    Comment:        The GeneXpert MRSA Assay (FDA approved for NASAL specimens only), is one component of a comprehensive MRSA colonization surveillance program. It is not intended to diagnose MRSA infection nor to guide or monitor treatment for MRSA infections. Performed at [facility], [place] [address], [place], [place] [address]   Respiratory Panel by PCR     Status: None   Collection Time: 04/19/18 11:05 AM   Result Value Ref Range Status   Adenovirus NOT DETECTED NOT DETECTED Final   Coronavirus 229E NOT DETECTED NOT DETECTED Final    Comment: (NOTE) The Coronavirus on the Respiratory Panel, DOES NOT test for the novel  Coronavirus (2019 nCoV)    Coronavirus HKU1 NOT DETECTED NOT DETECTED Final   Coronavirus NL63 NOT DETECTED NOT DETECTED Final   Coronavirus OC43 NOT DETECTED NOT DETECTED Final   Metapneumovirus NOT DETECTED NOT DETECTED Final   Rhinovirus / Enterovirus NOT DETECTED NOT DETECTED Final   Influenza A NOT DETECTED NOT DETECTED Final   Influenza B NOT DETECTED NOT DETECTED Final   Parainfluenza Virus 1 NOT DETECTED NOT DETECTED Final   Parainfluenza Virus 2 NOT DETECTED NOT DETECTED Final   Parainfluenza Virus 3 NOT DETECTED NOT DETECTED Final   Parainfluenza Virus 4 NOT DETECTED NOT DETECTED Final   Respiratory Syncytial Virus NOT DETECTED NOT DETECTED Final   Bordetella pertussis NOT DETECTED NOT DETECTED Final   Chlamydophila pneumoniae NOT DETECTED NOT DETECTED Final   Mycoplasma pneumoniae NOT DETECTED NOT DETECTED Final    Comment: Performed at [facility], [place]. [address] [place], [place] [address]    RADIOLOGY STUDIES/RESULTS: Ct Abdomen Pelvis Wo Contrast  Result Date: 04/18/2018 CLINICAL DATA:  Fever a few days as recently completed treatment for staph peritonitis with last dose of Ancef given 04/13/2018. Fever this morning with vomiting and abdominal pain over the weekend. Patient with end-stage renal disease post dialysis this morning. EXAM: CT ABDOMEN AND PELVIS WITHOUT CONTRAST TECHNIQUE: Multidetector CT imaging of the abdomen and pelvis was performed following the standard protocol without IV contrast. COMPARISON:  04/09/2018, 03/27/2018 and 11/11/2017 as well as 02/05/2015 FINDINGS: Lower chest: Minimal linear atelectasis/scarring left base. Small sliding hiatal hernia. Hepatobiliary: Previous cholecystectomy. Liver and biliary tree are within normal.  Pancreas: Normal. Spleen: Normal. Adrenals/Urinary Tract: Adrenal glands are normal. Kidneys are somewhat small without hydronephrosis or nephrolithiasis. Ureters and bladder are normal. Stomach/Bowel: Surgical sutures over the stomach compatible previous bypass procedure. Small bowel is normal. Appendix is normal. Mild diverticulosis over the descending colon and sigmoid colon. Vascular/Lymphatic: Minimal calcified plaque over the abdominal aorta. No evidence of adenopathy. Reproductive: Normal. Other: Peritoneal dialysis catheter enters the lower abdomen just right of midline. Tip over the midline pelvis. Mild amount of free peritoneal fluid likely related to patient's peritoneal dialysis. Tiny amount of free peritoneal air also likely related to patient's peritoneal dialysis as this is unchanged. No focal inflammatory process. Musculoskeletal: Unchanged. IMPRESSION: Mild  amount of free peritoneal fluid and tiny amount of free peritoneal air likely related patient's peritoneal dialysis. Dialysis catheter over the midline pelvis without significant change. Diverticulosis of the colon without active inflammation. Postsurgical changes as described. Electronically Signed   By: Marin Olp M.D.   On: 04/18/2018 15:50   X-ray Chest Pa And Lateral  Result Date: 04/19/2018 CLINICAL DATA:  Shortness of breath, nausea EXAM: CHEST - 2 VIEW COMPARISON:  04/18/2018 FINDINGS: Linear left base atelectasis or scarring. Heart is upper limits normal in size. Right lung clear. No effusions or acute bony abnormality. IMPRESSION: Left basilar atelectasis or scarring. Electronically Signed   By: Rolm Baptise M.D.   On: 04/19/2018 07:47   Mr Brain Wo Contrast  Result Date: 04/21/2018 CLINICAL DATA:  Initial evaluation for acute altered mental status, encephalopathy. EXAM: MRI HEAD WITHOUT CONTRAST TECHNIQUE: Multiplanar, multiecho pulse sequences of the brain and surrounding structures were obtained without intravenous  contrast. COMPARISON:  Prior MRI from 08/14/2016. FINDINGS: Brain: Examination technically limited as the patient was unable to tolerate the full length of the exam. DWI sequences with axial FLAIR sequence only was performed. Additionally, images provided are somewhat degraded by motion artifact. Cerebral volume within normal limits for age. Few scattered patchy subcentimeter FLAIR hyperintensities noted within the supratentorial cerebral white matter, nonspecific, but fairly mild for age and of doubtful significance in the acute setting. No mass lesion, midline shift, or mass effect. No hydrocephalus. No extra-axial fluid collection. No appreciable areas of chronic infarction. No imaging findings to suggest acute intracranial hemorrhage on this limited exam. No evidence for acute or subacute ischemia on DWI sequences. Vascular: Not well assessed on this limited exam. Skull and upper cervical spine: Calvarium intact. No focal marrow replacing lesion. No scalp soft tissue abnormality. Sinuses/Orbits: Globes orbital soft tissues grossly unremarkable. Paranasal sinuses are largely clear. No appreciable mastoid effusion. Other: None. IMPRESSION: 1. Technically limited exam due to patient's inability to tolerate the full length of the study. 2. Grossly negative MRI appearance of the brain. No evidence for acute intracranial infarct or other definite abnormality. Electronically Signed   By: Jeannine Boga M.D.   On: 04/21/2018 13:35   Ct Abdomen Pelvis W Contrast  Result Date: 04/09/2018 CLINICAL DATA:  Nausea and vomiting and abdominal pain EXAM: CT ABDOMEN AND PELVIS WITH CONTRAST TECHNIQUE: Multidetector CT imaging of the abdomen and pelvis was performed using the standard protocol following bolus administration of intravenous contrast. CONTRAST:  136m OMNIPAQUE IOHEXOL 300 MG/ML  SOLN COMPARISON:  03/27/2018 FINDINGS: Lower chest: No acute abnormality. Hepatobiliary: Diffuse fatty infiltration of the liver  is noted. No focal mass is seen. The gallbladder has been surgically removed. Pancreas: Unremarkable. No pancreatic ductal dilatation or surrounding inflammatory changes. Spleen: Normal in size without focal abnormality. Adrenals/Urinary Tract: Adrenal glands are within normal limits. Renal vascular calcifications are noted. No obstructive changes are seen. The ureters are within normal limits bilaterally. The bladder is decompressed. Stomach/Bowel: Scattered diverticular changes noted within the colon. No evidence of diverticulitis is seen. The appendix is within normal limits. No obstructive or inflammatory changes of the small bowel are noted. Changes of prior sleeve gastrectomy are seen. Small sliding-type hiatal hernia is noted. Vascular/Lymphatic: Aortic atherosclerosis. No enlarged abdominal or pelvic lymph nodes. Reproductive: Uterus and bilateral adnexa are unremarkable. Other: Peritoneal dialysis catheter is noted deep within the pelvis. Small amount of free fluid is noted within the pelvis associated with the catheter. Additionally there are a few foci of air identified  in the upper abdomen adjacent to the spleen and stomach. These are likely related to the peritoneal dialysis catheter. By history the patient is receiving treatment for peritonitis. Musculoskeletal: No acute bony abnormality is seen. IMPRESSION: Minimal free air and free fluid within the abdomen likely related to the patient's known peritoneal dialysis catheter. No definitive evidence to suggest perforation are seen. Diverticulosis without diverticulitis. Fatty liver. Electronically Signed   By: Inez Catalina M.D.   On: 04/09/2018 16:50   Ct Abdomen Pelvis W Contrast  Result Date: 03/27/2018 CLINICAL DATA:  Lower abdominal pain starting Friday. Patient does dialysis every day at home. Prior gastric sleeve, peritoneal catheter placement and cholecystectomy. EXAM: CT ABDOMEN AND PELVIS WITH CONTRAST TECHNIQUE: Multidetector CT imaging of  the abdomen and pelvis was performed using the standard protocol following bolus administration of intravenous contrast. CONTRAST:  115m OMNIPAQUE IOHEXOL 300 MG/ML  SOLN COMPARISON:  11/11/2017 CT FINDINGS: Lower chest: Top-normal heart size with coronary arteriosclerosis. No pericardial effusion or thickening. Clear lung bases with left basilar atelectasis. Hepatobiliary: Steatosis of the liver. More focal fatty infiltration adjacent to the falciform ligament. Surface nodularity of the liver compatible morphologic changes of cirrhosis. No space-occupying mass of the liver is seen. The patient is status post cholecystectomy. Pancreas: No pancreatic mass or ductal dilatation.  No inflammation. Spleen: Normal Adrenals/Urinary Tract: Normal bilateral adrenal glands. Mild bilateral renal atrophy and cortical thinning. No obstructive uropathy, mass or nephrolithiasis. The urinary bladder is unremarkable for the degree of distention. It is slightly thick-walled but this is likely due to underdistention. Cystitis is believed less likely. Stomach/Bowel: Gastric sleeve procedure without complicating features. Small hiatal hernia is noted. Normal small bowel rotation without obstruction or inflammation. Sigmoid diverticulosis without acute diverticulitis. The appendix is normal caliber with probable small amount of retained contrast noted. Vascular/Lymphatic: Aortoiliac atherosclerosis. No adenopathy by CT size criteria. Reproductive: Uterus and bilateral adnexa are unremarkable. Other: Dialysis catheter is seen coiled within the mid pelvis. Musculoskeletal: No acute nor suspicious osseous abnormality. IMPRESSION: 1. Steatosis of the liver with slight morphologic changes of cirrhosis given surface nodularity of the liver. No space-occupying mass is seen. 2. Status post cholecystectomy. 3. Gastric sleeve procedure without complicating features. 4. Sigmoid diverticulosis without acute diverticulitis. 5. Percutaneous  peritoneal dialysis catheter is seen coiled pelvis without complicating features. Electronically Signed   By: DAshley RoyaltyM.D.   On: 03/27/2018 20:55   Dg Chest Port 1 View  Result Date: 04/18/2018 CLINICAL DATA:  Fever EXAM: PORTABLE CHEST 1 VIEW COMPARISON:  03/16/2018 FINDINGS: The heart size and mediastinal contours are within normal limits. Both lungs are clear. The visualized skeletal structures are unremarkable. IMPRESSION: No active disease. Electronically Signed   By: HKathreen Devoid  On: 04/18/2018 11:31   UKoreaAbdomen Limited Ruq  Result Date: 04/19/2018 CLINICAL DATA:  Abnormal LFTs. EXAM: ULTRASOUND ABDOMEN LIMITED RIGHT UPPER QUADRANT COMPARISON:  CT abdomen pelvis from yesterday. FINDINGS: Gallbladder: Surgically absent. Common bile duct: Diameter: 4 mm, normal. Liver: No focal lesion identified. Subtle nodular contour. Coarsened, heterogeneous parenchymal echogenicity. Portal vein is patent on color Doppler imaging with normal direction of blood flow towards the liver. IMPRESSION: 1. Subtle liver surface nodularity with coarsened, heterogeneous parenchymal echogenicity, suspicious for cirrhosis. Electronically Signed   By: WTitus DubinM.D.   On: 04/19/2018 11:40     LOS: 5 days   SOren Binet MD  [facility]  If 7PM-7AM, please contact night-coverage  Please page via [email or web address]  Go to [email or web address] and  use [facility] universal password to access. If you do not have the password, please contact the hospital operator.  Locate the [facility] provider you are looking for under [facility] and page to a number that you can be directly reached. If you still have difficulty reaching the provider, please page the [facility] (Director on Call) for the Hospitalists listed on amion for assistance.  04/23/2018, 11:42 AM

## 2018-04-23 NOTE — Progress Notes (Signed)
Patient back from HD; alert, with her eyes open; unable to answers to my questions. VS stable; no sign/sympyoms of distress. Will continue to monitor.

## 2018-04-24 LAB — CBC
HCT: 32.3 % — ABNORMAL LOW (ref 36.0–46.0)
Hemoglobin: 10.1 g/dL — ABNORMAL LOW (ref 12.0–15.0)
MCH: 31.2 pg (ref 26.0–34.0)
MCHC: 31.3 g/dL (ref 30.0–36.0)
MCV: 99.7 fL (ref 80.0–100.0)
Platelets: 281 10*3/uL (ref 150–400)
RBC: 3.24 MIL/uL — ABNORMAL LOW (ref 3.87–5.11)
RDW: 15.9 % — ABNORMAL HIGH (ref 11.5–15.5)
WBC: 9.5 10*3/uL (ref 4.0–10.5)
nRBC: 0.2 % (ref 0.0–0.2)

## 2018-04-24 LAB — RENAL FUNCTION PANEL
Albumin: 2.5 g/dL — ABNORMAL LOW (ref 3.5–5.0)
Anion gap: 16 — ABNORMAL HIGH (ref 5–15)
BUN: 10 mg/dL (ref 6–20)
CO2: 23 mmol/L (ref 22–32)
Calcium: 8.4 mg/dL — ABNORMAL LOW (ref 8.9–10.3)
Chloride: 98 mmol/L (ref 98–111)
Creatinine, Ser: 4.85 mg/dL — ABNORMAL HIGH (ref 0.44–1.00)
GFR calc Af Amer: 11 mL/min — ABNORMAL LOW (ref >60)
GFR calc non Af Amer: 10 mL/min — ABNORMAL LOW (ref >60)
Glucose, Bld: 130 mg/dL — ABNORMAL HIGH (ref 70–99)
Phosphorus: 1.9 mg/dL — ABNORMAL LOW (ref 2.5–4.6)
Potassium: 3.6 mmol/L (ref 3.5–5.1)
Sodium: 137 mmol/L (ref 135–145)

## 2018-04-24 LAB — GLUCOSE, CAPILLARY
Glucose-Capillary: 99 mg/dL (ref 70–99)
Glucose-Capillary: 94 mg/dL (ref 70–99)
Glucose-Capillary: 88 mg/dL (ref 70–99)

## 2018-04-24 MED ORDER — CHLORHEXIDINE GLUCONATE CLOTH 2 % EX PADS: 6.0000 | MEDICATED_PAD | Freq: Every day | CUTANEOUS | Status: DC

## 2018-04-24 MED ORDER — HEPARIN SODIUM (PORCINE) 1000 UNIT/ML IJ SOLN
INTRAMUSCULAR | Status: AC
  Administered 2018-04-24: 3500 [IU] via INTRAVENOUS_CENTRAL
  Filled 2018-04-24: qty 4

## 2018-04-24 MED ORDER — MIDODRINE HCL 5 MG PO TABS
ORAL_TABLET | ORAL | Status: AC
  Administered 2018-04-24: 10 mg via ORAL
  Filled 2018-04-24: qty 2

## 2018-04-24 MED ORDER — CHLORHEXIDINE GLUCONATE CLOTH 2 % EX PADS
6.0000 | MEDICATED_PAD | Freq: Every day | CUTANEOUS | Status: DC
  Administered 2018-04-24 – 2018-04-26 (×3): 6 via TOPICAL

## 2018-04-24 MED ORDER — HEPARIN SODIUM (PORCINE) 1000 UNIT/ML DIALYSIS
3500.0000 [IU] | INTRAMUSCULAR | Status: DC | PRN
  Administered 2018-04-24: 11:00:00 3500 [IU] via INTRAVENOUS_CENTRAL
  Filled 2018-04-24: qty 3.5

## 2018-04-24 MED ORDER — VITAMIN B-1 100 MG PO TABS
100.0000 mg | ORAL_TABLET | Freq: Every day | ORAL | Status: DC
  Administered 2018-04-25 – 2018-04-26 (×2): 100 mg via ORAL
  Filled 2018-04-24 (×3): qty 1

## 2018-04-24 NOTE — Progress Notes (Signed)
Subjective: Awake, alert and eating breakfast.   Objective: Current vital signs: BP 126/69   Pulse 88   Temp 97.7 F (36.5 C) (Oral)   Resp 15   Ht 5' 5"  (1.651 m)   Wt 112.7 kg   SpO2 99%   BMI 41.35 kg/m  Vital signs in last 24 hours: Temp:  [97.7 F (36.5 C)-99 F (37.2 C)] 97.7 F (36.5 C) (04/05 0609) Pulse Rate:  [78-98] 88 (04/05 0609) Resp:  [13-23] 15 (04/05 0609) BP: (99-177)/(57-82) 126/69 (04/05 0609) SpO2:  [97 %-100 %] 99 % (04/05 0609) Weight:  [112.7 kg-113.8 kg] 112.7 kg (04/05 0540)  Intake/Output from previous day: 04/04 0701 - 04/05 0700 In: 120 [P.O.:120] Out: 0  Intake/Output this shift: No intake/output data recorded. Nutritional status:  Diet Order            Diet heart healthy/carb modified Room service appropriate? No; Fluid consistency: Thin; Fluid restriction: 1500 mL Fluid  Diet effective now             General: NAD HEENT: [place]/AT Lungs: Respirations unlabored Ext: No edema noted to upper extremities.   Neurologic Exam: Ment: Awake and alert, but with increased latency of verbal responses. Oriented to several location items and the year, but not to day or month. Speech sparse but fluent. Able to follow all commands. Significantly improved since last exam.  CN: EOMI. Face symmetric. Phonation intact. Head is midline.  Motor: Moves all 4 extremities equally.  Cerebellar: No ataxia noted.  Other: Asterixis is noted with arms outstretched.   Lab Results: Results for orders placed or performed during the hospital encounter of 04/18/18 (from the past 48 hour(s))  Glucose, capillary     Status: Abnormal   Collection Time: 04/22/18 11:53 AM  Result Value Ref Range   Glucose-Capillary 113 (H) 70 - 99 mg/dL  Glucose, capillary     Status: Abnormal   Collection Time: 04/22/18  5:19 PM  Result Value Ref Range   Glucose-Capillary 104 (H) 70 - 99 mg/dL  Glucose, capillary     Status: None   Collection Time: 04/22/18  8:34 PM  Result Value  Ref Range   Glucose-Capillary 97 70 - 99 mg/dL  Renal function panel     Status: Abnormal   Collection Time: 04/22/18 10:13 PM  Result Value Ref Range   Sodium 137 135 - 145 mmol/L   Potassium 3.5 3.5 - 5.1 mmol/L   Chloride 100 98 - 111 mmol/L   CO2 23 22 - 32 mmol/L   Glucose, Bld 102 (H) 70 - 99 mg/dL   BUN 20 6 - 20 mg/dL   Creatinine, Ser 9.35 (H) 0.44 - 1.00 mg/dL   Calcium 7.9 (L) 8.9 - 10.3 mg/dL   Phosphorus 3.2 2.5 - 4.6 mg/dL   Albumin 2.7 (L) 3.5 - 5.0 g/dL   GFR calc non Af Amer 4 (L) >60 mL/min   GFR calc Af Amer 5 (L) >60 mL/min   Anion gap 14 5 - 15    Comment: Performed at [facility], [address], [place], [place] [address]  CBC     Status: Abnormal   Collection Time: 04/22/18 10:13 PM  Result Value Ref Range   WBC 8.5 4.0 - 10.5 K/uL   RBC 3.42 (L) 3.87 - 5.11 MIL/uL   Hemoglobin 10.8 (L) 12.0 - 15.0 g/dL   HCT 32.6 (L) 36.0 - 46.0 %   MCV 95.3 80.0 - 100.0 fL   MCH  31.6 26.0 - 34.0 pg   MCHC 33.1 30.0 - 36.0 g/dL   RDW 14.8 11.5 - 15.5 %   Platelets 210 150 - 400 K/uL   nRBC 0.2 0.0 - 0.2 %    Comment: Performed at [facility], [place] [address] [place], [place] [address]  CBC     Status: Abnormal   Collection Time: 04/23/18  3:06 AM  Result Value Ref Range   WBC 10.5 4.0 - 10.5 K/uL   RBC 3.58 (L) 3.87 - 5.11 MIL/uL   Hemoglobin 11.4 (L) 12.0 - 15.0 g/dL   HCT 33.4 (L) 36.0 - 46.0 %   MCV 93.3 80.0 - 100.0 fL   MCH 31.8 26.0 - 34.0 pg   MCHC 34.1 30.0 - 36.0 g/dL   RDW 14.8 11.5 - 15.5 %   Platelets 184 150 - 400 K/uL   nRBC 0.2 0.0 - 0.2 %    Comment: Performed at [facility], [place] [address] [place], [place] [address]  Comprehensive metabolic panel     Status: Abnormal   Collection Time: 04/23/18  3:06 AM  Result Value Ref Range   Sodium 140 135 - 145 mmol/L   Potassium 4.1 3.5 - 5.1 mmol/L   Chloride 101 98 - 111 mmol/L   CO2 22 22 - 32 mmol/L   Glucose, Bld 99 70 - 99 mg/dL   BUN 10 6 - 20 mg/dL   Creatinine, Ser  6.37 (H) 0.44 - 1.00 mg/dL   Calcium 8.0 (L) 8.9 - 10.3 mg/dL   Total Protein 5.9 (L) 6.5 - 8.1 g/dL   Albumin 2.8 (L) 3.5 - 5.0 g/dL   AST 290 (H) 15 - 41 U/L   ALT 24 0 - 44 U/L   Alkaline Phosphatase 143 (H) 38 - 126 U/L   Total Bilirubin 1.4 (H) 0.3 - 1.2 mg/dL   GFR calc non Af Amer 7 (L) >60 mL/min   GFR calc Af Amer 8 (L) >60 mL/min   Anion gap 17 (H) 5 - 15    Comment: Performed at [facility], [place] [address] [place], [place] [address]  Magnesium     Status: None   Collection Time: 04/23/18  3:06 AM  Result Value Ref Range   Magnesium 1.9 1.7 - 2.4 mg/dL    Comment: Performed at [facility] [address] [place], [place] [address]  Glucose, capillary     Status: None   Collection Time: 04/23/18  8:13 AM  Result Value Ref Range   Glucose-Capillary 82 70 - 99 mg/dL  Glucose, capillary     Status: Abnormal   Collection Time: 04/23/18  5:23 PM  Result Value Ref Range   Glucose-Capillary 118 (H) 70 - 99 mg/dL  Glucose, capillary     Status: None   Collection Time: 04/23/18  9:12 PM  Result Value Ref Range   Glucose-Capillary 93 70 - 99 mg/dL  Glucose, capillary     Status: None   Collection Time: 04/24/18  7:45 AM  Result Value Ref Range   Glucose-Capillary 99 70 - 99 mg/dL    Recent Results (from the past 431 hour(s))  Helicobacter pylori special antigen     Status: None   Collection Time: 04/18/18  8:06 AM  Result Value Ref Range Status   MICRO NUMBER: [id number]  Final   SPECIMEN QUALITY Adequate  Final   SOURCE: STOOL  Final   STATUS: FINAL  Final  RESULT:   Final    Not Detected  Antimicrobials, proton pump inhibitors, and bismuth preparations inhibit H. pylori and ingestion up to two weeks prior to testing may cause false negative results. If clinically indicated the test should be repeated on a new specimen  obtained two weeks after discontinuing treatment.   Blood Culture (routine x 2)     Status: None   Collection Time: 04/18/18 11:32 AM   Result Value Ref Range Status   Specimen Description BLOOD RIGHT FOREARM  Final   Special Requests   Final    BOTTLES DRAWN AEROBIC AND ANAEROBIC Blood Culture adequate volume   Culture   Final    NO GROWTH 5 DAYS Performed at [facility], [address], [place], [place] [address]    Report Status 04/23/2018 FINAL  Final  Blood Culture (routine x 2)     Status: None   Collection Time: 04/18/18 11:40 AM  Result Value Ref Range Status   Specimen Description BLOOD RIGHT ANTECUBITAL  Final   Special Requests   Final    BOTTLES DRAWN AEROBIC AND ANAEROBIC Blood Culture adequate volume   Culture   Final    NO GROWTH 5 DAYS Performed at [facility], [place] [address], [place], [place] [address]    Report Status 04/23/2018 FINAL  Final  Culture, body fluid-bottle     Status: None   Collection Time: 04/18/18  1:47 PM  Result Value Ref Range Status   Specimen Description PERITONEAL CAVITY  Final   Special Requests NONE  Final   Culture   Final    NO GROWTH 5 DAYS Performed at [facility], [address], [place], [place] [address]    Report Status 04/23/2018 FINAL  Final  Gram stain     Status: None   Collection Time: 04/18/18  1:47 PM  Result Value Ref Range Status   Specimen Description PERITONEAL CAVITY  Final   Special Requests NONE  Final   Gram Stain   Final    WBC PRESENT,BOTH PMN AND MONONUCLEAR NO ORGANISMS SEEN CYTOSPIN SMEAR Performed at [facility], [address] [place], [place] [address]    Report Status 04/18/2018 FINAL  Final  MRSA PCR Screening     Status: None   Collection Time: 04/18/18  2:37 PM  Result Value Ref Range Status   MRSA by PCR NEGATIVE NEGATIVE Final    Comment:        The GeneXpert MRSA Assay (FDA approved for NASAL specimens only), is one component of a comprehensive MRSA colonization surveillance program. It is not intended to diagnose MRSA infection nor to guide or monitor treatment for MRSA  infections. Performed at [facility], [place] [address], [place], [place] [address]   Respiratory Panel by PCR     Status: None   Collection Time: 04/19/18 11:05 AM  Result Value Ref Range Status   Adenovirus NOT DETECTED NOT DETECTED Final   Coronavirus 229E NOT DETECTED NOT DETECTED Final    Comment: (NOTE) The Coronavirus on the Respiratory Panel, DOES NOT test for the novel  Coronavirus (2019 nCoV)    Coronavirus HKU1 NOT DETECTED NOT DETECTED Final   Coronavirus NL63 NOT DETECTED NOT DETECTED Final   Coronavirus OC43 NOT DETECTED NOT DETECTED Final   Metapneumovirus NOT DETECTED NOT DETECTED Final   Rhinovirus / Enterovirus NOT DETECTED NOT DETECTED Final   Influenza A NOT DETECTED NOT DETECTED Final   Influenza  B NOT DETECTED NOT DETECTED Final   Parainfluenza Virus 1 NOT DETECTED NOT DETECTED Final   Parainfluenza Virus 2 NOT DETECTED NOT DETECTED Final   Parainfluenza Virus 3 NOT DETECTED NOT DETECTED Final   Parainfluenza Virus 4 NOT DETECTED NOT DETECTED Final   Respiratory Syncytial Virus NOT DETECTED NOT DETECTED Final   Bordetella pertussis NOT DETECTED NOT DETECTED Final   Chlamydophila pneumoniae NOT DETECTED NOT DETECTED Final   Mycoplasma pneumoniae NOT DETECTED NOT DETECTED Final    Comment: Performed at [facility], [place] [address] [place], [place] [address]    Lipid Panel No results for input(s): CHOL, TRIG, HDL, CHOLHDL, VLDL, LDLCALC in the last 72 hours.  Studies/Results: No results found.  Medications:  Scheduled: . aspirin EC  81 mg Oral Daily  . calcium acetate  1,334 mg Oral TID WC  . Chlorhexidine Gluconate Cloth  6 each Topical Q0600  . Chlorhexidine Gluconate Cloth  6 each Topical Q0600  . Chlorhexidine Gluconate Cloth  6 each Topical Q0600  . ferric citrate  210 mg Oral TID WC  . folic acid  1 mg Oral Daily  . gentamicin cream  1 application Topical Daily  . guaiFENesin  600 mg Oral BID  . heparin  5,000 Units Subcutaneous Q8H   . insulin aspart  0-5 Units Subcutaneous QHS  . insulin aspart  0-9 Units Subcutaneous TID WC  . lactulose  20 g Oral TID  . midodrine  10 mg Oral BID WC  . pantoprazole  40 mg Oral Daily  . thiamine injection  100 mg Intravenous Daily   Continuous: . sodium chloride 500 mL (04/18/18 1326)  . albumin human Stopped (04/22/18 0554)  . dialysis solution 1.5% low-MG/low-CA    . sodium chloride      Assessment:  53 y.o. female with a complex PMHx who presents with peritonitis, low grade fever and subsequent development of altered mental status.  1. EEG showed no seizures. Findings were most consistent with a metabolic encephalopathy. MRI brain was negative for acute abnormality. 2. Her history, exam, labs and imaging are most consistent with metabolic encephalopathy as the primary etiology. The intermittent positive myoclonus seen on her exam previously was most suggestive of a metabolic encephalopaty. Her exam today shows resolution of the positive myoclonus; however, some asterixis is now present  3. Overall her cognition has significantly improved with HD.   Recommendations: 1.  Continue hemodialysis  2.  Continue lactulose for any hepatic encephalopathy contribution. 3.  Neurology will sign off. Please call if there are additional questions. .   LOS: 6 days   @Electronically  signed: Dr. Kerney Elbe 04/24/2018  8:32 AM

## 2018-04-24 NOTE — Progress Notes (Deleted)
[facility] Progress Note  Subjective: Seen in HD unit. Awake, alert this am, answering questions.   Vitals:   04/23/18 2056 04/24/18 0056 04/24/18 0540 04/24/18 0609  BP: 139/78 (!) 99/58  126/69  Pulse: 92 78  88  Resp:    15  Temp: 99 F (37.2 C)   97.7 F (36.5 C)  TempSrc: Axillary   Oral  SpO2: 99% 98%  99%  Weight:   112.7 kg   Height:        Inpatient medications: . aspirin EC  81 mg Oral Daily  . calcium acetate  1,334 mg Oral TID WC  . Chlorhexidine Gluconate Cloth  6 each Topical Q0600  . Chlorhexidine Gluconate Cloth  6 each Topical Q0600  . Chlorhexidine Gluconate Cloth  6 each Topical Q0600  . ferric citrate  210 mg Oral TID WC  . folic acid  1 mg Oral Daily  . gentamicin cream  1 application Topical Daily  . guaiFENesin  600 mg Oral BID  . heparin  5,000 Units Subcutaneous Q8H  . insulin aspart  0-5 Units Subcutaneous QHS  . insulin aspart  0-9 Units Subcutaneous TID WC  . lactulose  20 g Oral TID  . midodrine  10 mg Oral BID WC  . pantoprazole  40 mg Oral Daily  . thiamine injection  100 mg Intravenous Daily   . sodium chloride 500 mL (04/18/18 1326)  . albumin human Stopped (04/22/18 0554)  . dialysis solution 1.5% low-MG/low-CA    . sodium chloride     sodium chloride, acetaminophen, albumin human, albuterol, camphor-menthol, ondansetron **OR** ondansetron (ZOFRAN) IV, sodium chloride    Exam: General: obese female alert,  mild myoclonic jerking remains  Heart: RRR Lungs: grossly CTA anteriorly  Abdomen: obese large pannus nontender Extremities: no sig LE edema Dialysis Access:  PD cath; L AVF in use on HD    Dialysis Orders: CCPD edw 116.5 6 exchnage 2.5 L dwell 1.5 hr dry day  Recent hgb 12 ipTH 866 Calcitriol 0.5/day, sensipar 60 qod, Aurixya 2 ac phoslo 3 ac  Assessment/Plan: 1. Fever -Tmax 99.3   hx recent staph warneri peritonitis tx Ancef IP. WBC cell count here only 4 on 3/30 so doubt peritonitis. RVP negative.  Cultures negative. Fevers gradually have resolved, afeb now. Admission abd CT unrevealing.   2. ESRD on CCPD: new AMS w/ ^^creat on PD, trial of HD underway. See below also. Pt has L AVF. Appears underdialyzed on PD>>Will likely need to transition to HD.   3. Altered mental status: in conjunction w myoclonus and  ^^creat 15-20 on admission we assumed pt has uremic encephalopathy and undergoing serial dialysis treatments to lower solute.  Creat down 6.37 on 4/4  and appears to be waking up.   4. BP/volume  - BP's lower, no edema on exam, at dry wt. No fluid off on HD.    5. Elevated LFTs-  AST elevated other LFT ok-recent AST had been normal in early March . GI evaluating  6. Anemia ckd  - hgb 11- 12 stable - no ESA/Fe  7. MBD ckd -  Resumed home ca acetate but hold sensipar for now due to marginally low corrected Ca. Ca / phos both in range.   8. Nutrition - alb 2.1 - renal carb mod    Lynnda Child PA-C [facility] Pager [phone] 04/24/2018,9:07 AM    Iron/TIBC/Ferritin/ %Sat    Component Value Date/Time   IRON 73 03/07/2018 1424  TIBC 290 01/03/2015 1650   FERRITIN >1500.0 (H) 03/07/2018 1424   IRONPCTSAT 29.5 03/07/2018 1424   Recent Labs  Lab 04/21/18 0512  04/22/18 2213 04/23/18 0306  NA  --    < > 137 140  K  --    < > 3.5 4.1  CL  --    < > 100 101  CO2  --    < > 23 22  GLUCOSE  --    < > 102* 99  BUN  --    < > 20 10  CREATININE  --    < > 9.35* 6.37*  CALCIUM  --    < > 7.9* 8.0*  PHOS  --   --  3.2  --   ALBUMIN  --    < > 2.7* 2.8*  INR 1.3*  --   --   --    < > = values in this interval not displayed.   Recent Labs  Lab 04/23/18 0306  AST 290*  ALT 24  ALKPHOS 143*  BILITOT 1.4*  PROT 5.9*   Recent Labs  Lab 04/23/18 0306  WBC 10.5  HGB 11.4*  HCT 33.4*  PLT 184

## 2018-04-24 NOTE — Progress Notes (Signed)
[facility] Progress Note  Subjective: more alert today, still w jerking UE's, doesn't remember events of last 2-3 days. No new c/o  Vitals:   04/23/18 2056 04/24/18 0056 04/24/18 0540 04/24/18 0609  BP: 139/78 (!) 99/58  126/69  Pulse: 92 78  88  Resp:    15  Temp: 99 F (37.2 C)   97.7 F (36.5 C)  TempSrc: Axillary   Oral  SpO2: 99% 98%  99%  Weight:   112.7 kg   Height:        Inpatient medications: . aspirin EC  81 mg Oral Daily  . calcium acetate  1,334 mg Oral TID WC  . Chlorhexidine Gluconate Cloth  6 each Topical Q0600  . Chlorhexidine Gluconate Cloth  6 each Topical Q0600  . Chlorhexidine Gluconate Cloth  6 each Topical Q0600  . ferric citrate  210 mg Oral TID WC  . folic acid  1 mg Oral Daily  . gentamicin cream  1 application Topical Daily  . guaiFENesin  600 mg Oral BID  . heparin  5,000 Units Subcutaneous Q8H  . insulin aspart  0-5 Units Subcutaneous QHS  . insulin aspart  0-9 Units Subcutaneous TID WC  . lactulose  20 g Oral TID  . midodrine  10 mg Oral BID WC  . pantoprazole  40 mg Oral Daily  . thiamine injection  100 mg Intravenous Daily   . sodium chloride 500 mL (04/18/18 1326)  . albumin human Stopped (04/22/18 0554)  . dialysis solution 1.5% low-MG/low-CA    . sodium chloride     sodium chloride, acetaminophen, albumin human, albuterol, camphor-menthol, ondansetron **OR** ondansetron (ZOFRAN) IV, sodium chloride    Exam: Gen: alert, no distress, alert and responsive, +myoclonus Heart: RRR Lungs: dim BS poor expansion grossly clear Abdomen: obese large pannus nontender Extremities: no sig LE edema Dialysis Access:  PD cath, LUA AVF+bruit  Dialysis Orders: CCPD edw 116.5 6 exchnage 2.5 L dwell 1.5 hr dry day  Recent hgb 12 ipTH 866 Calcitriol 0.5/day, sensipar 60 qod, Aurixya 2 ac phoslo 3 ac  Assessment/Plan: 1. ESRD on CCPD: uremic from underdialysis on PD. Starting to come around today, still jerking but alert first  time today. Plan short HD today and reg HD tomorrow. Will need transition back to HD, have d/w pt and husband on phone, both are in agreement.  Pt has L AVF. 2. Altered mental status: as above, resolving.  3. Fever -Tmax 99.3   hx recent staph warneri peritonitis tx Ancef IP. WBC cell count here only 4 on 3/30 so doubt peritonitis. Abd CT negative, RVP negative, no positive cultures, high procalcitonin. Fevers gradually have resolved, afeb now. Abx dc'd on 4/2.    4. BP/volume  - no edema on exam, below dry wt. Min UF on HD.   5. Elevated LFTs- AST 216  > 287 > 464 > 533 (was 24 03/23/18 at HD unit) other LFT ok-recent AST had been normal in early March - NH3 48. GI evaluating 6. Anemia ckd  - hgb 11- 12 stable - no ESA/Fe 7. MBD ckd -  Resumed home ca acetate but hold sensipar for now due to marginally low corrected Ca. Ca / phos both in range.  8. Nutrition - alb 2.1 - renal carb mod    [facility] 04/22/2018, 2:02 PM    Iron/TIBC/Ferritin/ %Sat    Component Value Date/Time   IRON 73 03/07/2018 1424   TIBC 290 01/03/2015 1650  FERRITIN >1500.0 (H) 03/07/2018 1424   IRONPCTSAT 29.5 03/07/2018 1424   Recent Labs  Lab 04/21/18 0512  04/22/18 2213 04/23/18 0306  NA  --    < > 137 140  K  --    < > 3.5 4.1  CL  --    < > 100 101  CO2  --    < > 23 22  GLUCOSE  --    < > 102* 99  BUN  --    < > 20 10  CREATININE  --    < > 9.35* 6.37*  CALCIUM  --    < > 7.9* 8.0*  PHOS  --   --  3.2  --   ALBUMIN  --    < > 2.7* 2.8*  INR 1.3*  --   --   --    < > = values in this interval not displayed.   Recent Labs  Lab 04/23/18 0306  AST 290*  ALT 24  ALKPHOS 143*  BILITOT 1.4*  PROT 5.9*   Recent Labs  Lab 04/23/18 0306  WBC 10.5  HGB 11.4*  HCT 33.4*  PLT 184

## 2018-04-24 NOTE — Progress Notes (Signed)
PT Cancellation Note  Patient Details Name: Brenda Davis MRN: [id number] DOB: 02/28/1965   Cancelled Treatment:    Reason Eval/Treat Not Completed: Patient not medically ready, at HD   [facility] 04/24/2018, 12:17 PM

## 2018-04-24 NOTE — Progress Notes (Signed)
PROGRESS NOTE        PATIENT DETAILS Name: Brenda Davis Age: 53 y.o. Sex: female Date of Birth: July 22, 1965 Admit Date: 04/18/2018 Admitting Physician Kerney Elbe, DO JGO:TLXBW, Arvid Right, MD  Brief Narrative: Patient is a 53 y.o. female history of ESRD on PD, HTN, dyslipidemia, DM-2-recent history of staph ordinary peritonitis (completed IV Ancef on 3/25),liver cirrhoses 2/2 NASH-presenting to the hospital with abdominal pain and fever.  Initially there was some concern for peritonitis-and patient was started on empiric antimicrobial therapy, however peritoneal fluid analysis was not consistent with peritonitis.  Hospital course complicated by development of acute metabolic encephalopathy.  See below for further details  Subjective: Much more awake and alert today.  Assessment/Plan: Sepsis: Sepsis physiology has improved.  Etiology unclear-initially concern for peritonitis given abdominal pain-but peritoneal fluid analysis not consistent with peritonitis.  Blood cultures negative, CT abdomen without any other source of infection, chest x-ray without any infection.  Respiratory virus panel negative.  Has now been afebrile for almost 5days-vancomycin/Flagyl/cefepime has all been discontinued.  She continues to be afebrile.    Acute metabolic encephalopathy: Significantly improved-suspicion for uremia secondary to under dialysis with PD.  Extensive work-up including MRI brain, EEG were all negative.  TSH/vitamin B12/HIV/RPR serology negative.  PD was discontinued-patient was started on HD-after 3 sessions of HD on a daily basis-her mental status is markedly improved today.    Abdominal pain: Resolved.  Unclear etiology--abdomen is soft-but difficult to evaluate given encephalopathy.  Most imaging studies including RUQ ultrasound, CT of the abdomen without any acute abnormalities.  Continue supportive care  Isolated elevation in AST: Unclear  etiology-RUQ ultrasound without any obvious pathology-apart from possible cirrhosis.  Patient is s/p cholecystectomy.  AST seems to have plateaued and now downtrending.  GI following.  Acute hepatitis serology negative.    Hypokalemia: Repleted  ESRD on peritoneal dialysis: Initially on PD-but nephrology now planning to transition to HD long-term.  History of liver cirrhosis secondary to NASH: See above regarding concerns about hepatic encephalopathy  CAD: No obvious anginal symptoms-continue aspirin  DM-2: CBGs stable-continue SSI  HTN: Blood pressure stable-continue to hold Coreg  Dyslipidemia: Continue to hold statin due to elevated AST  Morbid obesity-s/p gastric sleeve surgery  DVT Prophylaxis: Prophylactic Heparin   Code Status: Full code  Family Communication: Spouse over the phone on 4/5  Disposition Plan: Remain inpatient  Antimicrobial agents: Anti-infectives (From admission, onward)   Start     Dose/Rate Route Frequency Ordered Stop   04/21/18 1209  vancomycin variable dose per unstable renal function (pharmacist dosing)  Status:  Discontinued      Does not apply See admin instructions 04/21/18 1211 04/21/18 1314   04/19/18 1400  ceFEPIme (MAXIPIME) 1 g in sodium chloride 0.9 % 100 mL IVPB  Status:  Discontinued     1 g 200 mL/hr over 30 Minutes Intravenous Every 24 hours 04/18/18 1205 04/22/18 1429   04/18/18 2200  metroNIDAZOLE (FLAGYL) IVPB 500 mg  Status:  Discontinued     500 mg 100 mL/hr over 60 Minutes Intravenous Every 8 hours 04/18/18 1617 04/21/18 1133   04/18/18 1630  metroNIDAZOLE (FLAGYL) IVPB 500 mg     500 mg 100 mL/hr over 60 Minutes Intravenous  Once 04/18/18 1615 04/18/18 1959   04/18/18 1615  ceFEPIme (MAXIPIME) 2 g in sodium chloride 0.9 % 100  mL IVPB     2 g 200 mL/hr over 30 Minutes Intravenous  Once 04/18/18 1615 04/18/18 2008   04/18/18 1215  vancomycin (VANCOCIN) 2,500 mg in sodium chloride 0.9 % 500 mL IVPB     2,500 mg 250 mL/hr  over 120 Minutes Intravenous  Once 04/18/18 1203 04/18/18 1527   04/18/18 1130  ceFEPIme (MAXIPIME) 2 g in sodium chloride 0.9 % 100 mL IVPB  Status:  Discontinued     2 g 200 mL/hr over 30 Minutes Intravenous  Once 04/18/18 1120 04/18/18 1614   04/18/18 1130  metroNIDAZOLE (FLAGYL) IVPB 500 mg  Status:  Discontinued     500 mg 100 mL/hr over 60 Minutes Intravenous  Once 04/18/18 1120 04/18/18 1614   04/18/18 1130  vancomycin (VANCOCIN) IVPB 1000 mg/200 mL premix  Status:  Discontinued     1,000 mg 200 mL/hr over 60 Minutes Intravenous  Once 04/18/18 1120 04/18/18 1126   04/18/18 1130  vancomycin (VANCOCIN) 2,000 mg in sodium chloride 0.9 % 500 mL IVPB  Status:  Discontinued     2,000 mg 250 mL/hr over 120 Minutes Intravenous  Once 04/18/18 1126 04/18/18 1203      Procedures: None  CONSULTS:  nephrology  Neurology Gastroenterology  Time spent: 35- minutes-Greater than 50% of this time was spent in counseling, explanation of diagnosis, planning of further management, and coordination of care.  MEDICATIONS: Scheduled Meds:  aspirin EC  81 mg Oral Daily   calcium acetate  1,334 mg Oral TID WC   Chlorhexidine Gluconate Cloth  6 each Topical Q0600   ferric citrate  210 mg Oral TID WC   folic acid  1 mg Oral Daily   gentamicin cream  1 application Topical Daily   guaiFENesin  600 mg Oral BID   heparin       heparin  5,000 Units Subcutaneous Q8H   insulin aspart  0-5 Units Subcutaneous QHS   insulin aspart  0-9 Units Subcutaneous TID WC   lactulose  20 g Oral TID   midodrine  10 mg Oral BID WC   pantoprazole  40 mg Oral Daily   thiamine injection  100 mg Intravenous Daily   Continuous Infusions:  sodium chloride 500 mL (04/18/18 1326)   albumin human Stopped (04/22/18 0554)   dialysis solution 1.5% low-MG/low-CA     sodium chloride     PRN Meds:.sodium chloride, acetaminophen, albumin human, albuterol, camphor-menthol, [START ON 04/25/2018] heparin,  ondansetron **OR** ondansetron (ZOFRAN) IV, sodium chloride   PHYSICAL EXAM: Vital signs: Vitals:   04/24/18 0609 04/24/18 1020 04/24/18 1027 04/24/18 1030  BP: 126/69 131/85 122/79 125/76  Pulse: 88 97 86 82  Resp: 15 16    Temp: 97.7 F (36.5 C) 98.5 F (36.9 C)    TempSrc: Oral Oral    SpO2: 99% 98%    Weight:  113.3 kg    Height:       Filed Weights   04/23/18 1648 04/24/18 0540 04/24/18 1020  Weight: 113.5 kg 112.7 kg 113.3 kg   Body mass index is 41.57 kg/m.   General appearance:Awake, alert, not in any distress.  Clearly better today. Eyes:no scleral icterus. HEENT: Atraumatic and Normocephalic Neck: supple, no JVD. Resp:Good air entry bilaterally,no rales or rhonchi CVS: S1 S2 regular, no murmurs.  GI: Bowel sounds present, Non tender and not distended with no gaurding, rigidity or rebound. Extremities: B/L Lower Ext shows no edema, both legs are warm to touch Neurology:  Non focal Musculoskeletal:No  digital cyanosis Skin:No Rash, warm and dry Wounds:N/A  I have personally reviewed following labs and imaging studies  LABORATORY DATA: CBC: Recent Labs  Lab 04/18/18 1143 04/19/18 0521 04/20/18 0025 04/21/18 8921 04/22/18 0316 04/22/18 2213 04/23/18 0306  WBC 4.7 3.7* 4.3 9.5 11.1*   10.8* 8.5 10.5  NEUTROABS 3.7 2.6 2.8  --  8.7*  --   --   HGB 12.0 11.3* 12.8 12.2 11.6*   11.5* 10.8* 11.4*  HCT 37.1 35.2* 37.3 34.4* 34.6*   34.0* 32.6* 33.4*  MCV 98.7 97.8 96.4 93.5 94.0   93.2 95.3 93.3  PLT 205 181 184 217 193   193 210 194    Basic Metabolic Panel: Recent Labs  Lab 04/18/18 1435  04/20/18 0007 04/20/18 0025 04/21/18 0822 04/22/18 0316 04/22/18 2213 04/23/18 0306  NA  --    < > 132* 130* 136 139 137 140  K  --    < > 2.8* 2.9* 2.7* 3.4* 3.5 4.1  CL  --    < > 94* 91* 99 100 100 101  CO2  --    < > 15* 18* 21* 21* 23 22  GLUCOSE  --    < > 141* 141* 170* 97 102* 99  BUN  --    < > 44* 42* 39* 20 20 10   CREATININE  --    < > 16.88*  17.02* 16.33* 11.18* 9.35* 6.37*  CALCIUM  --    < > 7.0* 7.1* 7.4* 7.8* 7.9* 8.0*  MG 1.5*  --  1.7 1.7  --  1.8  --  1.9  PHOS 4.5  --   --   --   --   --  3.2  --    < > = values in this interval not displayed.    GFR: Estimated Creatinine Clearance: 12.8 mL/min (A) (by C-G formula based on SCr of 6.37 mg/dL (H)).  Liver Function Tests: Recent Labs  Lab 04/19/18 0521 04/20/18 0025 04/21/18 0822 04/22/18 0316 04/22/18 2213 04/23/18 0306  AST 287* 464* 533* 426*  --  290*  ALT 13 18 30 27   --  24  ALKPHOS 129* 145* 129* 140*  --  143*  BILITOT 0.9 0.9 0.9 1.4*  --  1.4*  PROT 5.3* 5.8* 5.6* 5.7*  --  5.9*  ALBUMIN 2.1* 2.5* 2.3* 2.9* 2.7* 2.8*   Recent Labs  Lab 04/18/18 1143  LIPASE 26   Recent Labs  Lab 04/19/18 1133 04/20/18 0007 04/21/18 0512 04/22/18 0316  AMMONIA 40* 43* 48* 16    Coagulation Profile: Recent Labs  Lab 04/21/18 0512  INR 1.3*    Cardiac Enzymes: No results for input(s): CKTOTAL, CKMB, CKMBINDEX, TROPONINI in the last 168 hours.  BNP (last 3 results) No results for input(s): PROBNP in the last 8760 hours.  HbA1C: No results for input(s): HGBA1C in the last 72 hours.  CBG: Recent Labs  Lab 04/22/18 2034 04/23/18 0813 04/23/18 1723 04/23/18 2112 04/24/18 0745  GLUCAP 97 82 118* 93 99    Lipid Profile: No results for input(s): CHOL, HDL, LDLCALC, TRIG, CHOLHDL, LDLDIRECT in the last 72 hours.  Thyroid Function Tests: No results for input(s): TSH, T4TOTAL, FREET4, T3FREE, THYROIDAB in the last 72 hours.  Anemia Panel: Recent Labs    04/22/18 0316  VITAMINB12 1,407*    Urine analysis:    Component Value Date/Time   COLORURINE COLORLESS (A) 04/18/2018 1900   APPEARANCEUR CLEAR 04/18/2018 1900   LABSPEC 1.005  04/18/2018 1900   PHURINE 8.0 04/18/2018 1900   GLUCOSEU >=500 (A) 04/18/2018 1900   GLUCOSEU >=1000 (A) 10/28/2017 0839   HGBUR SMALL (A) 04/18/2018 1900   HGBUR negative 09/17/2009 0000   BILIRUBINUR  NEGATIVE 04/18/2018 1900   KETONESUR NEGATIVE 04/18/2018 1900   PROTEINUR 30 (A) 04/18/2018 1900   UROBILINOGEN 0.2 10/28/2017 0839   NITRITE NEGATIVE 04/18/2018 1900   LEUKOCYTESUR NEGATIVE 04/18/2018 1900    Sepsis Labs: Lactic Acid, Venous    Component Value Date/Time   LATICACIDVEN 1.4 04/18/2018 1821    MICROBIOLOGY: Recent Results (from the past 034 hour(s))  Helicobacter pylori special antigen     Status: None   Collection Time: 04/18/18  8:06 AM  Result Value Ref Range Status   MICRO NUMBER: [id number]  Final   SPECIMEN QUALITY Adequate  Final   SOURCE: STOOL  Final   STATUS: FINAL  Final   RESULT:   Final    Not Detected  Antimicrobials, proton pump inhibitors, and bismuth preparations inhibit H. pylori and ingestion up to two weeks prior to testing may cause false negative results. If clinically indicated the test should be repeated on a new specimen  obtained two weeks after discontinuing treatment.   Blood Culture (routine x 2)     Status: None   Collection Time: 04/18/18 11:32 AM  Result Value Ref Range Status   Specimen Description BLOOD RIGHT FOREARM  Final   Special Requests   Final    BOTTLES DRAWN AEROBIC AND ANAEROBIC Blood Culture adequate volume   Culture   Final    NO GROWTH 5 DAYS Performed at [facility], [address] [place], [place] [address]    Report Status 04/23/2018 FINAL  Final  Blood Culture (routine x 2)     Status: None   Collection Time: 04/18/18 11:40 AM  Result Value Ref Range Status   Specimen Description BLOOD RIGHT ANTECUBITAL  Final   Special Requests   Final    BOTTLES DRAWN AEROBIC AND ANAEROBIC Blood Culture adequate volume   Culture   Final    NO GROWTH 5 DAYS Performed at [facility], [place] [address], [place], [place] [address]    Report Status 04/23/2018 FINAL  Final  Culture, body fluid-bottle     Status: None   Collection Time: 04/18/18  1:47 PM  Result Value Ref Range Status   Specimen Description  PERITONEAL CAVITY  Final   Special Requests NONE  Final   Culture   Final    NO GROWTH 5 DAYS Performed at [facility], [address], [place], [place] [address]    Report Status 04/23/2018 FINAL  Final  Gram stain     Status: None   Collection Time: 04/18/18  1:47 PM  Result Value Ref Range Status   Specimen Description PERITONEAL CAVITY  Final   Special Requests NONE  Final   Gram Stain   Final    WBC PRESENT,BOTH PMN AND MONONUCLEAR NO ORGANISMS SEEN CYTOSPIN SMEAR Performed at [facility], [address], [place], [place] [address]    Report Status 04/18/2018 FINAL  Final  MRSA PCR Screening     Status: None   Collection Time: 04/18/18  2:37 PM  Result Value Ref Range Status   MRSA by PCR NEGATIVE NEGATIVE Final    Comment:        The GeneXpert MRSA Assay (FDA approved for NASAL specimens only), is one component of a  comprehensive MRSA colonization surveillance program. It is not intended to diagnose MRSA infection nor to guide or monitor treatment for MRSA infections. Performed at [facility], [place] [address], [place], [place] [address]   Respiratory Panel by PCR     Status: None   Collection Time: 04/19/18 11:05 AM  Result Value Ref Range Status   Adenovirus NOT DETECTED NOT DETECTED Final   Coronavirus 229E NOT DETECTED NOT DETECTED Final    Comment: (NOTE) The Coronavirus on the Respiratory Panel, DOES NOT test for the novel  Coronavirus (2019 nCoV)    Coronavirus HKU1 NOT DETECTED NOT DETECTED Final   Coronavirus NL63 NOT DETECTED NOT DETECTED Final   Coronavirus OC43 NOT DETECTED NOT DETECTED Final   Metapneumovirus NOT DETECTED NOT DETECTED Final   Rhinovirus / Enterovirus NOT DETECTED NOT DETECTED Final   Influenza A NOT DETECTED NOT DETECTED Final   Influenza B NOT DETECTED NOT DETECTED Final   Parainfluenza Virus 1 NOT DETECTED NOT DETECTED Final   Parainfluenza Virus 2 NOT DETECTED NOT DETECTED Final   Parainfluenza Virus 3 NOT  DETECTED NOT DETECTED Final   Parainfluenza Virus 4 NOT DETECTED NOT DETECTED Final   Respiratory Syncytial Virus NOT DETECTED NOT DETECTED Final   Bordetella pertussis NOT DETECTED NOT DETECTED Final   Chlamydophila pneumoniae NOT DETECTED NOT DETECTED Final   Mycoplasma pneumoniae NOT DETECTED NOT DETECTED Final    Comment: Performed at [facility], [place]. [address], [place], [place] [address]    RADIOLOGY STUDIES/RESULTS: Ct Abdomen Pelvis Wo Contrast  Result Date: 04/18/2018 CLINICAL DATA:  Fever a few days as recently completed treatment for staph peritonitis with last dose of Ancef given 04/13/2018. Fever this morning with vomiting and abdominal pain over the weekend. Patient with end-stage renal disease post dialysis this morning. EXAM: CT ABDOMEN AND PELVIS WITHOUT CONTRAST TECHNIQUE: Multidetector CT imaging of the abdomen and pelvis was performed following the standard protocol without IV contrast. COMPARISON:  04/09/2018, 03/27/2018 and 11/11/2017 as well as 02/05/2015 FINDINGS: Lower chest: Minimal linear atelectasis/scarring left base. Small sliding hiatal hernia. Hepatobiliary: Previous cholecystectomy. Liver and biliary tree are within normal. Pancreas: Normal. Spleen: Normal. Adrenals/Urinary Tract: Adrenal glands are normal. Kidneys are somewhat small without hydronephrosis or nephrolithiasis. Ureters and bladder are normal. Stomach/Bowel: Surgical sutures over the stomach compatible previous bypass procedure. Small bowel is normal. Appendix is normal. Mild diverticulosis over the descending colon and sigmoid colon. Vascular/Lymphatic: Minimal calcified plaque over the abdominal aorta. No evidence of adenopathy. Reproductive: Normal. Other: Peritoneal dialysis catheter enters the lower abdomen just right of midline. Tip over the midline pelvis. Mild amount of free peritoneal fluid likely related to patient's peritoneal dialysis. Tiny amount of free peritoneal air also likely related  to patient's peritoneal dialysis as this is unchanged. No focal inflammatory process. Musculoskeletal: Unchanged. IMPRESSION: Mild amount of free peritoneal fluid and tiny amount of free peritoneal air likely related patient's peritoneal dialysis. Dialysis catheter over the midline pelvis without significant change. Diverticulosis of the colon without active inflammation. Postsurgical changes as described. Electronically Signed   By: Marin Olp M.D.   On: 04/18/2018 15:50   X-ray Chest Pa And Lateral  Result Date: 04/19/2018 CLINICAL DATA:  Shortness of breath, nausea EXAM: CHEST - 2 VIEW COMPARISON:  04/18/2018 FINDINGS: Linear left base atelectasis or scarring. Heart is upper limits normal in size. Right lung clear. No effusions or acute bony abnormality. IMPRESSION: Left basilar atelectasis or scarring. Electronically Signed   By: Lennette Bihari  Dover M.D.   On: 04/19/2018 07:47   Mr Brain Wo Contrast  Result Date: 04/21/2018 CLINICAL DATA:  Initial evaluation for acute altered mental status, encephalopathy. EXAM: MRI HEAD WITHOUT CONTRAST TECHNIQUE: Multiplanar, multiecho pulse sequences of the brain and surrounding structures were obtained without intravenous contrast. COMPARISON:  Prior MRI from 08/14/2016. FINDINGS: Brain: Examination technically limited as the patient was unable to tolerate the full length of the exam. DWI sequences with axial FLAIR sequence only was performed. Additionally, images provided are somewhat degraded by motion artifact. Cerebral volume within normal limits for age. Few scattered patchy subcentimeter FLAIR hyperintensities noted within the supratentorial cerebral white matter, nonspecific, but fairly mild for age and of doubtful significance in the acute setting. No mass lesion, midline shift, or mass effect. No hydrocephalus. No extra-axial fluid collection. No appreciable areas of chronic infarction. No imaging findings to suggest acute intracranial hemorrhage on this limited  exam. No evidence for acute or subacute ischemia on DWI sequences. Vascular: Not well assessed on this limited exam. Skull and upper cervical spine: Calvarium intact. No focal marrow replacing lesion. No scalp soft tissue abnormality. Sinuses/Orbits: Globes orbital soft tissues grossly unremarkable. Paranasal sinuses are largely clear. No appreciable mastoid effusion. Other: None. IMPRESSION: 1. Technically limited exam due to patient's inability to tolerate the full length of the study. 2. Grossly negative MRI appearance of the brain. No evidence for acute intracranial infarct or other definite abnormality. Electronically Signed   By: Jeannine Boga M.D.   On: 04/21/2018 13:35   Ct Abdomen Pelvis W Contrast  Result Date: 04/09/2018 CLINICAL DATA:  Nausea and vomiting and abdominal pain EXAM: CT ABDOMEN AND PELVIS WITH CONTRAST TECHNIQUE: Multidetector CT imaging of the abdomen and pelvis was performed using the standard protocol following bolus administration of intravenous contrast. CONTRAST:  171m OMNIPAQUE IOHEXOL 300 MG/ML  SOLN COMPARISON:  03/27/2018 FINDINGS: Lower chest: No acute abnormality. Hepatobiliary: Diffuse fatty infiltration of the liver is noted. No focal mass is seen. The gallbladder has been surgically removed. Pancreas: Unremarkable. No pancreatic ductal dilatation or surrounding inflammatory changes. Spleen: Normal in size without focal abnormality. Adrenals/Urinary Tract: Adrenal glands are within normal limits. Renal vascular calcifications are noted. No obstructive changes are seen. The ureters are within normal limits bilaterally. The bladder is decompressed. Stomach/Bowel: Scattered diverticular changes noted within the colon. No evidence of diverticulitis is seen. The appendix is within normal limits. No obstructive or inflammatory changes of the small bowel are noted. Changes of prior sleeve gastrectomy are seen. Small sliding-type hiatal hernia is noted. Vascular/Lymphatic:  Aortic atherosclerosis. No enlarged abdominal or pelvic lymph nodes. Reproductive: Uterus and bilateral adnexa are unremarkable. Other: Peritoneal dialysis catheter is noted deep within the pelvis. Small amount of free fluid is noted within the pelvis associated with the catheter. Additionally there are a few foci of air identified in the upper abdomen adjacent to the spleen and stomach. These are likely related to the peritoneal dialysis catheter. By history the patient is receiving treatment for peritonitis. Musculoskeletal: No acute bony abnormality is seen. IMPRESSION: Minimal free air and free fluid within the abdomen likely related to the patient's known peritoneal dialysis catheter. No definitive evidence to suggest perforation are seen. Diverticulosis without diverticulitis. Fatty liver. Electronically Signed   By: MInez CatalinaM.D.   On: 04/09/2018 16:50   Ct Abdomen Pelvis W Contrast  Result Date: 03/27/2018 CLINICAL DATA:  Lower abdominal pain starting Friday. Patient does dialysis every day at home. Prior gastric sleeve, peritoneal catheter placement  and cholecystectomy. EXAM: CT ABDOMEN AND PELVIS WITH CONTRAST TECHNIQUE: Multidetector CT imaging of the abdomen and pelvis was performed using the standard protocol following bolus administration of intravenous contrast. CONTRAST:  161m OMNIPAQUE IOHEXOL 300 MG/ML  SOLN COMPARISON:  11/11/2017 CT FINDINGS: Lower chest: Top-normal heart size with coronary arteriosclerosis. No pericardial effusion or thickening. Clear lung bases with left basilar atelectasis. Hepatobiliary: Steatosis of the liver. More focal fatty infiltration adjacent to the falciform ligament. Surface nodularity of the liver compatible morphologic changes of cirrhosis. No space-occupying mass of the liver is seen. The patient is status post cholecystectomy. Pancreas: No pancreatic mass or ductal dilatation.  No inflammation. Spleen: Normal Adrenals/Urinary Tract: Normal bilateral  adrenal glands. Mild bilateral renal atrophy and cortical thinning. No obstructive uropathy, mass or nephrolithiasis. The urinary bladder is unremarkable for the degree of distention. It is slightly thick-walled but this is likely due to underdistention. Cystitis is believed less likely. Stomach/Bowel: Gastric sleeve procedure without complicating features. Small hiatal hernia is noted. Normal small bowel rotation without obstruction or inflammation. Sigmoid diverticulosis without acute diverticulitis. The appendix is normal caliber with probable small amount of retained contrast noted. Vascular/Lymphatic: Aortoiliac atherosclerosis. No adenopathy by CT size criteria. Reproductive: Uterus and bilateral adnexa are unremarkable. Other: Dialysis catheter is seen coiled within the mid pelvis. Musculoskeletal: No acute nor suspicious osseous abnormality. IMPRESSION: 1. Steatosis of the liver with slight morphologic changes of cirrhosis given surface nodularity of the liver. No space-occupying mass is seen. 2. Status post cholecystectomy. 3. Gastric sleeve procedure without complicating features. 4. Sigmoid diverticulosis without acute diverticulitis. 5. Percutaneous peritoneal dialysis catheter is seen coiled pelvis without complicating features. Electronically Signed   By: DAshley RoyaltyM.D.   On: 03/27/2018 20:55   Dg Chest Port 1 View  Result Date: 04/18/2018 CLINICAL DATA:  Fever EXAM: PORTABLE CHEST 1 VIEW COMPARISON:  03/16/2018 FINDINGS: The heart size and mediastinal contours are within normal limits. Both lungs are clear. The visualized skeletal structures are unremarkable. IMPRESSION: No active disease. Electronically Signed   By: HKathreen Devoid  On: 04/18/2018 11:31   UKoreaAbdomen Limited Ruq  Result Date: 04/19/2018 CLINICAL DATA:  Abnormal LFTs. EXAM: ULTRASOUND ABDOMEN LIMITED RIGHT UPPER QUADRANT COMPARISON:  CT abdomen pelvis from yesterday. FINDINGS: Gallbladder: Surgically absent. Common bile duct:  Diameter: 4 mm, normal. Liver: No focal lesion identified. Subtle nodular contour. Coarsened, heterogeneous parenchymal echogenicity. Portal vein is patent on color Doppler imaging with normal direction of blood flow towards the liver. IMPRESSION: 1. Subtle liver surface nodularity with coarsened, heterogeneous parenchymal echogenicity, suspicious for cirrhosis. Electronically Signed   By: WTitus DubinM.D.   On: 04/19/2018 11:40     LOS: 6 days   SOren Binet MD  [facility]  If 7PM-7AM, please contact night-coverage  Please page via [email or web address]  Go to [email or web address] and use [facility]'s universal password to access. If you do not have the password, please contact the hospital operator.  Locate the [facility] Hospitalprovider you are looking for under [facility] and page to a number that you can be directly reached. If you still have difficulty reaching the provider, please page [facility] Call[facility] for the Hospitalists listed on amion for assistance.  04/24/2018, 10:57 AM

## 2018-04-25 LAB — HEPATIC FUNCTION PANEL
ALT: 15 U/L (ref 0–44)
AST: 100 U/L — ABNORMAL HIGH (ref 15–41)
Albumin: 2.5 g/dL — ABNORMAL LOW (ref 3.5–5.0)
Alkaline Phosphatase: 124 U/L (ref 38–126)
Bilirubin, Direct: 0.2 mg/dL (ref 0.0–0.2)
Indirect Bilirubin: 0.9 mg/dL (ref 0.3–0.9)
Total Bilirubin: 1.1 mg/dL (ref 0.3–1.2)
Total Protein: 5.5 g/dL — ABNORMAL LOW (ref 6.5–8.1)

## 2018-04-25 LAB — GLUCOSE, CAPILLARY
Glucose-Capillary: 114 mg/dL — ABNORMAL HIGH (ref 70–99)
Glucose-Capillary: 23 mg/dL — CL (ref 70–99)
Glucose-Capillary: 130 mg/dL — ABNORMAL HIGH (ref 70–99)
Glucose-Capillary: 90 mg/dL (ref 70–99)

## 2018-04-25 LAB — CBC
HCT: 30.4 % — ABNORMAL LOW (ref 36.0–46.0)
Hemoglobin: 9.9 g/dL — ABNORMAL LOW (ref 12.0–15.0)
MCH: 32.9 pg (ref 26.0–34.0)
MCHC: 32.6 g/dL (ref 30.0–36.0)
MCV: 101 fL — ABNORMAL HIGH (ref 80.0–100.0)
Platelets: 265 10*3/uL (ref 150–400)
RBC: 3.01 MIL/uL — ABNORMAL LOW (ref 3.87–5.11)
RDW: 15.9 % — ABNORMAL HIGH (ref 11.5–15.5)
WBC: 10.3 10*3/uL (ref 4.0–10.5)
nRBC: 0.8 % — ABNORMAL HIGH (ref 0.0–0.2)

## 2018-04-25 LAB — RENAL FUNCTION PANEL
Albumin: 2.4 g/dL — ABNORMAL LOW (ref 3.5–5.0)
Anion gap: 13 (ref 5–15)
BUN: 12 mg/dL (ref 6–20)
CO2: 25 mmol/L (ref 22–32)
Calcium: 8.1 mg/dL — ABNORMAL LOW (ref 8.9–10.3)
Chloride: 98 mmol/L (ref 98–111)
Creatinine, Ser: 4.46 mg/dL — ABNORMAL HIGH (ref 0.44–1.00)
GFR calc Af Amer: 12 mL/min — ABNORMAL LOW (ref >60)
GFR calc non Af Amer: 11 mL/min — ABNORMAL LOW (ref >60)
Glucose, Bld: 90 mg/dL (ref 70–99)
Phosphorus: 1.6 mg/dL — ABNORMAL LOW (ref 2.5–4.6)
Potassium: 3.4 mmol/L — ABNORMAL LOW (ref 3.5–5.1)
Sodium: 136 mmol/L (ref 135–145)

## 2018-04-25 LAB — AMMONIA: Ammonia: 24 umol/L (ref 9–35)

## 2018-04-25 MED ORDER — MIDODRINE HCL 5 MG PO TABS
ORAL_TABLET | ORAL | Status: AC
  Filled 2018-04-25: qty 2

## 2018-04-25 MED ORDER — PRO-STAT SUGAR FREE PO LIQD
30.0000 mL | Freq: Two times a day (BID) | ORAL | Status: DC
  Administered 2018-04-25 – 2018-04-26 (×3): 30 mL via ORAL
  Filled 2018-04-25 (×2): qty 30

## 2018-04-25 MED ORDER — CINACALCET HCL 30 MG PO TABS
30.0000 mg | ORAL_TABLET | Freq: Every day | ORAL | Status: DC
  Administered 2018-04-25: 30 mg via ORAL
  Filled 2018-04-25: qty 1

## 2018-04-25 NOTE — Evaluation (Addendum)
Occupational Therapy Evaluation Patient Details Name: Brenda Davis MRN: [id number] DOB: 04/29/1965 Today's Date: 04/25/2018    History of Present Illness Patient is a 53 y.o. female history of ESRD on PD, HTN, dyslipidemia, DM-2-recent history of staph ordinary peritonitis (completed IV Ancef on 3/25),liver cirrhoses 2/2 NASH-presenting to the hospital with abdominal pain and fever.  Peritonitis fluid analysis negative, CT abdomen negative, MRI negative with workup for acute metabolic encephalopathy.    Clinical Impression   PTA patient reports independent and working.  Admitted for above and limited by problem list below, including decreased activity tolerance, impaired balance, and generalized weakness.  Patient currently requires min assist for transfers, min assist for LB ADLs, total assist for toileting, and supervision for bed mobility.  Patient oriented x 4, but noted poor awareness to deficits and safety, poor problem solving; recommend further functional cognition using pill box test. Limited eval due to poor tolerance to activity today.  Patient will benefit from further OT services while admitted and after dc at [facility] level in order to optimize independence with ADLs, mobility and return to PLOF.     Follow Up Recommendations  Home health OT;Supervision/Assistance - 24 hour    Equipment Recommendations  3 in 1 bedside commode    Recommendations for Other Services       Precautions / Restrictions Precautions Precautions: Fall Restrictions Weight Bearing Restrictions: No      Mobility Bed Mobility Overal bed mobility: Needs Assistance Bed Mobility: Supine to Sit;Sit to Supine     Supine to sit: Supervision Sit to supine: Supervision   General bed mobility comments: supervision for safeyt   Transfers Overall transfer level: Needs assistance   Transfers: Sit to/from Stand;Stand Pivot Transfers Sit to Stand: Min assist Stand pivot transfers: Min assist        General transfer comment: min assist for safety, unsteady upon standing; +2 present for safety     Balance Overall balance assessment: Needs assistance Sitting-balance support: No upper extremity supported;Feet supported Sitting balance-Leahy Scale: Fair     Standing balance support: Bilateral upper extremity supported;During functional activity Standing balance-Leahy Scale: Poor Standing balance comment: reliant on B UE and external support                           ADL either performed or assessed with clinical judgement   ADL Overall ADL's : Needs assistance/impaired     Grooming: Wash/dry hands;Set up;Sitting   Upper Body Bathing: Supervision/ safety;Set up;Sitting   Lower Body Bathing: Minimal assistance;+2 for safety/equipment;Sit to/from stand   Upper Body Dressing : Set up;Supervision/safety;Sitting   Lower Body Dressing: Minimal assistance;Sit to/from stand;Cueing for safety;+2 for safety/equipment   Toilet Transfer: Minimal assistance;+2 for safety/equipment;Stand-pivot;BSC Toilet Transfer Details (indicate cue type and reason): min assist for safety Toileting- Clothing Manipulation and Hygiene: Total assistance;+2 for safety/equipment;Sit to/from stand       Functional mobility during ADLs: Minimal assistance;Cueing for safety;+2 for safety/equipment General ADL Comments: pt limited by generalized weakness, impaired balance      Vision Baseline Vision/History: Wears glasses Wears Glasses: Reading only Patient Visual Report: No change from baseline Vision Assessment?: No apparent visual deficits     Perception     Praxis      Pertinent Vitals/Pain Pain Assessment: No/denies pain     Hand Dominance Right   Extremity/Trunk Assessment Upper Extremity Assessment Upper Extremity Assessment: Generalized weakness   Lower Extremity Assessment Lower Extremity Assessment: Defer to PT evaluation  Communication  Communication Communication: No difficulties   Cognition Arousal/Alertness: Lethargic Behavior During Therapy: WFL for tasks assessed/performed Overall Cognitive Status: Impaired/Different from baseline Area of Impairment: Attention;Awareness;Problem solving                   Current Attention Level: Sustained       Awareness: Emergent Problem Solving: Slow processing;Decreased initiation;Difficulty sequencing;Requires verbal cues General Comments: pt oriented, requires increased time to process and follow multipe step commands    General Comments  VSS, BP soft at 96/69    Exercises     Shoulder Instructions      Home Living Family/patient expects to be discharged to:: Private residence Living Arrangements: Spouse/significant other Available Help at Discharge: Family;Available PRN/intermittently(spouse works ) Type of Home: House Home Access: Stairs to enter Technical brewer of Steps: 1   Home Layout: One level     Bathroom Shower/Tub: Teacher, early years/pre: Handicapped height     Home Equipment: None          Prior Functioning/Environment Level of Independence: Independent        Comments: driving, working ([employer])         OT Problem List: Decreased strength;Decreased activity tolerance;Impaired balance (sitting and/or standing);Decreased safety awareness;Decreased cognition;Decreased knowledge of use of DME or AE;Decreased knowledge of precautions      OT Treatment/Interventions: Self-care/ADL training;Therapeutic exercise;DME and/or AE instruction;Therapeutic activities;Cognitive remediation/compensation;Patient/family education;Balance training    OT Goals(Current goals can be found in the care plan section) Acute Rehab OT Goals Patient Stated Goal: to get home  OT Goal Formulation: With patient Time For Goal Achievement: 05/09/18 Potential to Achieve Goals: Good  OT Frequency: Min 3X/week   Barriers to D/C:             Co-evaluation PT/OT/SLP Co-Evaluation/Treatment: Yes Reason for Co-Treatment: Complexity of the patient's impairments (multi-system involvement)   OT goals addressed during session: ADL's and self-care      AM-PAC OT "6 Clicks" Daily Activity     Outcome Measure Help from another person eating meals?: A Little Help from another person taking care of personal grooming?: A Little Help from another person toileting, which includes using toliet, bedpan, or urinal?: Total Help from another person bathing (including washing, rinsing, drying)?: A Lot Help from another person to put on and taking off regular upper body clothing?: A Little Help from another person to put on and taking off regular lower body clothing?: A Lot 6 Click Score: 14   End of Session Nurse Communication: Mobility status  Activity Tolerance: Patient limited by lethargy Patient left: in bed;with call bell/phone within reach  OT Visit Diagnosis: Other abnormalities of gait and mobility (R26.89);Muscle weakness (generalized) (M62.81);Other symptoms and signs involving cognitive function                Time: 1400-1427 OT Time Calculation (min): 27 min Charges:  OT General Charges $OT Visit: 1 Visit OT Evaluation $OT Eval Moderate Complexity: [place], OT Acute Rehabilitation Services Pager [phone] Office [phone]   Delight Stare 04/25/2018, 3:20 PM

## 2018-04-25 NOTE — Evaluation (Signed)
Physical Therapy Evaluation Patient Details Name: Brenda Davis MRN: [id number] DOB: [birth year] Today's Date: 04/25/2018   History of Present Illness  Patient is a 53 y.o. female history of ESRD on PD, HTN, dyslipidemia, DM-2-recent history of staph ordinary peritonitis (completed IV Ancef on 3/25),liver cirrhoses 2/2 NASH-presenting to the hospital with abdominal pain and fever.  Peritonitis fluid analysis negative, CT abdomen negative, MRI negative with workup for acute metabolic encephalopathy.   Clinical Impression  Patient presents with generalized weakness, impaired balance, impaired cognition and impaired mobility s/p above. Pt s/p HD today and reports feeling fatigued and tired.Tolerated standing and SPT to/from [facility] with min A for balance/safety. Pt unsteady on feet. Declined further ambulation. BP soft- 96/69. Pt independent PTA and lives with spouse. Will follow acutely to maximize independence and mobility prior to return home.    Follow Up Recommendations Home health PT;Supervision for mobility/OOB    Equipment Recommendations  None recommended by PT    Recommendations for Other Services       Precautions / Restrictions Precautions Precautions: Fall Precaution Comments: soft BP Restrictions Weight Bearing Restrictions: No      Mobility  Bed Mobility Overal bed mobility: Needs Assistance Bed Mobility: Supine to Sit;Sit to Supine     Supine to sit: Supervision Sit to supine: Supervision   General bed mobility comments: supervision for safety. Use of rail. Dizziness sitting EOB.  Transfers Overall transfer level: Needs assistance Equipment used: 1 person hand held assist Transfers: Sit to/from Stand Sit to Stand: Min assist Stand pivot transfers: Min assist       General transfer comment: min assist for safety, unsteady upon standing with LOB back onto [facility] x2; +2 present for safety. SPT bed to/from [facility] with min A.   Ambulation/Gait              General Gait Details: Deferred  Stairs            Wheelchair Mobility    Modified Rankin (Stroke Patients Only)       Balance Overall balance assessment: Needs assistance Sitting-balance support: No upper extremity supported;Feet supported Sitting balance-Leahy Scale: Fair Sitting balance - Comments: Able to donn socks sitting EOB without difficulty.    Standing balance support: Bilateral upper extremity supported;During functional activity Standing balance-Leahy Scale: Poor Standing balance comment: reliant on B UE and external support                             Pertinent Vitals/Pain Pain Assessment: No/denies pain    Home Living Family/patient expects to be discharged to:: Private residence Living Arrangements: Spouse/significant other Available Help at Discharge: Family;Available PRN/intermittently(spouse works) Type of Home: House Home Access: Stairs to enter   Technical brewer of Steps: 1 Home Layout: One level Home Equipment: None      Prior Function Level of Independence: Independent         Comments: driving, working ([employer])      Hand Dominance   Dominant Hand: Right    Extremity/Trunk Assessment   Upper Extremity Assessment Upper Extremity Assessment: Defer to OT evaluation    Lower Extremity Assessment Lower Extremity Assessment: Generalized weakness       Communication   Communication: No difficulties  Cognition Arousal/Alertness: Awake/alert Behavior During Therapy: WFL for tasks assessed/performed Overall Cognitive Status: Impaired/Different from baseline Area of Impairment: Attention;Awareness;Problem solving  Current Attention Level: Sustained       Awareness: Emergent Problem Solving: Slow processing;Decreased initiation;Difficulty sequencing;Requires verbal cues General Comments: pt oriented, requires increased time to process and follow multi step commands.      General  Comments General comments (skin integrity, edema, etc.): VSS throughout, BP soft post session 96/69.    Exercises     Assessment/Plan    PT Assessment Patient needs continued PT services  PT Problem List Decreased strength;Decreased balance;Decreased cognition;Cardiopulmonary status limiting activity;Decreased mobility;Decreased activity tolerance       PT Treatment Interventions DME instruction;Functional mobility training;Balance training;Patient/family education;Gait training;Therapeutic activities;Therapeutic exercise    PT Goals (Current goals can be found in the Care Plan section)  Acute Rehab PT Goals Patient Stated Goal: to get home  PT Goal Formulation: With patient Time For Goal Achievement: 05/09/18 Potential to Achieve Goals: Good    Frequency Min 3X/week   Barriers to discharge Decreased caregiver support spouse works    Co-evaluation PT/OT/SLP Co-Evaluation/Treatment: Yes Reason for Co-Treatment: Complexity of the patient's impairments (multi-system involvement);To address functional/ADL transfers PT goals addressed during session: Mobility/safety with mobility;Balance OT goals addressed during session: ADL's and self-care       AM-PAC PT "6 Clicks" Mobility  Outcome Measure Help needed turning from your back to your side while in a flat bed without using bedrails?: A Little Help needed moving from lying on your back to sitting on the side of a flat bed without using bedrails?: A Little Help needed moving to and from a bed to a chair (including a wheelchair)?: A Little Help needed standing up from a chair using your arms (e.g., wheelchair or bedside chair)?: A Little Help needed to walk in hospital room?: A Little Help needed climbing 3-5 steps with a railing? : A Little 6 Click Score: 18    End of Session Equipment Utilized During Treatment: Gait belt Activity Tolerance: Patient tolerated treatment well Patient left: in bed;with call bell/phone within  reach;with bed alarm set Nurse Communication: Mobility status PT Visit Diagnosis: Unsteadiness on feet (R26.81);Muscle weakness (generalized) (M62.81);Difficulty in walking, not elsewhere classified (R26.2)    Time: 7116-5790 PT Time Calculation (min) (ACUTE ONLY): 27 min   Charges:   PT Evaluation $PT Eval Moderate Complexity: 1 Mod          Wray Kearns, PT, DPT Acute Rehabilitation Services Pager [phone] Office [phone]      Marguarite Arbour A Sabra Heck 04/25/2018, 4:11 PM

## 2018-04-25 NOTE — Progress Notes (Signed)
Dialysis Coordinator (DC) received call from Renal NP to start referral process for modality change from [facility] to in-center HD at [facility] for patient. DC attempted to contact patient, but number listed for patient in Epic went to patient's husband. He states he will call DC back on a three way call with patient when patient is available today. DC states appreciation and will await call back to initiate process.  Alphonzo Cruise Dialysis Coordinator [phone]

## 2018-04-25 NOTE — Progress Notes (Signed)
PT Cancellation Note  Patient Details Name: Brenda Davis MRN: [id number] DOB: 06/01/1965   Cancelled Treatment:    Reason Eval/Treat Not Completed: Patient at procedure or test/unavailable Pt off floor at HD. Will follow up as time allows.   [name] 04/25/2018, 11:07 AM Wray Kearns, PT, DPT Acute Rehabilitation Services Pager [phone] Office [phone]

## 2018-04-25 NOTE — Progress Notes (Signed)
Dialysis Coordinator received call back from patient and husband on three-way call. Assessment completed and referral process initiated for OP HD seat at [facility]. DC will continue to follow until seat is secured and patient is discharged.   Alphonzo Cruise Dialysis Coordinator [phone]

## 2018-04-25 NOTE — Progress Notes (Addendum)
[facility] Progress Note   Subjective:  Awake, alert, oriented X 3. Back to baseline. Still can't remember events prior to and immediately after admission. No C/Os. Tolerated HD without issues.    Objective Vitals:   04/25/18 0900 04/25/18 0930 04/25/18 1000 04/25/18 1030  BP: 120/74 111/69 115/72 99/60  Pulse: 73 71 77 82  Resp: 16 15 16 16   Temp:      TempSrc:      SpO2:      Weight:      Height:       Physical Exam General: WN,WD in NAD Heart: S1,S2 RRR Lungs: Bilateral breath sounds decreased in bases otherwise CTAB Abdomen: Active BS. PD catheter RUQ of abd, drsg CDI Extremities: No LE edema Dialysis Access: L AVF + bruit   Additional Objective Labs: Basic Metabolic Panel: Recent Labs  Lab 04/22/18 2213 04/23/18 0306 04/24/18 1038 04/25/18 0638  NA 137 140 137 136  K 3.5 4.1 3.6 3.4*  CL 100 101 98 98  CO2 23 22 23 25   GLUCOSE 102* 99 130* 90  BUN 20 10 10 12   CREATININE 9.35* 6.37* 4.85* 4.46*  CALCIUM 7.9* 8.0* 8.4* 8.1*  PHOS 3.2  --  1.9* 1.6*   Liver Function Tests: Recent Labs  Lab 04/22/18 0316  04/23/18 0306 04/24/18 1038 04/25/18 0451 04/25/18 0638  AST 426*  --  290*  --  100*  --   ALT 27  --  24  --  15  --   ALKPHOS 140*  --  143*  --  124  --   BILITOT 1.4*  --  1.4*  --  1.1  --   PROT 5.7*  --  5.9*  --  5.5*  --   ALBUMIN 2.9*   < > 2.8* 2.5* 2.5* 2.4*   < > = values in this interval not displayed.   Recent Labs  Lab 04/18/18 1143  LIPASE 26   CBC: Recent Labs  Lab 04/19/18 0521 04/20/18 0025  04/22/18 0316 04/22/18 2213 04/23/18 0306 04/24/18 1037 04/25/18 0638  WBC 3.7* 4.3   < > 11.1*  10.8* 8.5 10.5 9.5 10.3  NEUTROABS 2.6 2.8  --  8.7*  --   --   --   --   HGB 11.3* 12.8   < > 11.6*  11.5* 10.8* 11.4* 10.1* 9.9*  HCT 35.2* 37.3   < > 34.6*  34.0* 32.6* 33.4* 32.3* 30.4*  MCV 97.8 96.4   < > 94.0  93.2 95.3 93.3 99.7 101.0*  PLT 181 184   < > 193  193 210 184 281 265   < > = values in  this interval not displayed.   Blood Culture    Component Value Date/Time   SDES PERITONEAL CAVITY 04/18/2018 1347   SDES PERITONEAL CAVITY 04/18/2018 1347   SPECREQUEST NONE 04/18/2018 1347   SPECREQUEST NONE 04/18/2018 1347   CULT  04/18/2018 1347    NO GROWTH 5 DAYS Performed at [facility], [place] [address], [address], [place] [address]    REPTSTATUS 04/23/2018 FINAL 04/18/2018 1347   REPTSTATUS 04/18/2018 FINAL 04/18/2018 1347    Cardiac Enzymes: No results for input(s): CKTOTAL, CKMB, CKMBINDEX, TROPONINI in the last 168 hours. CBG: Recent Labs  Lab 04/23/18 1723 04/23/18 2112 04/24/18 0745 04/24/18 1654 04/24/18 2242  GLUCAP 118* 93 99 94 88   Iron Studies: No results for input(s): IRON, TIBC, TRANSFERRIN, FERRITIN in the last 72 hours. @lablastinr3 @ Studies/Results:  No results found. Medications: . sodium chloride 500 mL (04/18/18 1326)  . albumin human Stopped (04/22/18 0554)  . dialysis solution 1.5% low-MG/low-CA    . sodium chloride     . aspirin EC  81 mg Oral Daily  . calcium acetate  1,334 mg Oral TID WC  . Chlorhexidine Gluconate Cloth  6 each Topical Q0600  . ferric citrate  210 mg Oral TID WC  . folic acid  1 mg Oral Daily  . gentamicin cream  1 application Topical Daily  . guaiFENesin  600 mg Oral BID  . heparin  5,000 Units Subcutaneous Q8H  . insulin aspart  0-5 Units Subcutaneous QHS  . insulin aspart  0-9 Units Subcutaneous TID WC  . lactulose  20 g Oral TID  . midodrine  10 mg Oral BID WC  . pantoprazole  40 mg Oral Daily  . thiamine  100 mg Oral Daily     Dialysis Orders: CCPD edw 116.5 6 exchnage 2.5 L dwell 1.5 hr dry day  Recent hgb 12 ipTH 866 Calcitriol 0.5/day, sensipar 60 qod, Aurixya 2 ac phoslo 3 ac  Assessment/Plan: 1. ESRDon CCPD: uremic from underdialysis on PD. Now back to baseline after serial HD.Will need transition back to HD. Patient attended [facility] prior to starting PD. Will need CLIP to [facility]. Pt has L  AVF-higher venous pressures but no issues with cannulation. 2. Altered mental status: as above. Patient is back to baseline.  3. Fever -Afebrile past 24 hours. hx recent staph warneri peritonitistx Ancef IP. WBC cell count here only 4 on 3/30 so doubt peritonitis. Abd CT negative, RVP negative, no positive cultures, high procalcitonin. Fevers gradually have resolved, afeb now. Abx dc'd on 4/2.    4. BP/volume- BP on the soft side toward end of HD. Net UF 533. Post wt 111.7 kg (bed wt).  5. Elevated LFTs-AST 216  > 287 > 464 > 533 (was 24 03/23/18 at HD unit) other LFT ok-recent AST had been normal in early March - NH3 48. GI evaluating. Acute hepatitis panel negative.  6. Anemia ckd- hgb 11- 12 on adm. 9.9 today. Follow HGB.  7. MBD ckd- CA 8.1 C Ca 9.1 Phos 1.6. DC binders, recheck Phos tomorrow.  Change sensipar dose to 30 mg PO q day.  8. Nutrition- alb 2.4 - carb mod, add prostat. Phos is low-liberalize diet to include dairy products 9. DM-per primary  Jimmye Norman. Devario Bucklew NP-C 04/25/2018, 11:13 AM  [employer] [phone]

## 2018-04-25 NOTE — Progress Notes (Signed)
OT Cancellation Note  Patient Details Name: Brenda Davis MRN: [id number] DOB: [birth year]   Cancelled Treatment:    Reason Eval/Treat Not Completed: Patient at procedure or test/ unavailable, pt at HD.  Will follow.   Delight Stare, OT Acute Rehabilitation Services Pager [phone] Office [phone]   Delight Stare 04/25/2018, 11:09 AM

## 2018-04-25 NOTE — Progress Notes (Signed)
PROGRESS NOTE        PATIENT DETAILS Name: Brenda Davis Age: 53 y.o. Sex: female Date of Birth: 01/31/1965 Admit Date: 04/18/2018 Admitting Physician Kerney Elbe, DO ALP:FXTKW, Arvid Right, MD  Brief Narrative: Patient is a 53 y.o. female history of ESRD on PD, HTN, dyslipidemia, DM-2-recent history of staph ordinary peritonitis (completed IV Ancef on 3/25),liver cirrhoses 2/2 NASH-presenting to the hospital with abdominal pain and fever.  Initially there was some concern for peritonitis-and patient was started on empiric antimicrobial therapy, however peritoneal fluid analysis was not consistent with peritonitis.  Hospital course complicated by development of acute metabolic encephalopathy.  See below for further details  Subjective: Awake and alert-seen in hemodialysis earlier today.  Denies any chest pain or shortness of breath.  Assessment/Plan: Sepsis: Sepsis physiology has improved.  Etiology unclear-initially concern for peritonitis given abdominal pain-but peritoneal fluid analysis not consistent with peritonitis.  Blood cultures negative, CT abdomen without any other source of infection, chest x-ray without any infection.  Respiratory virus panel negative.  Has now been afebrile for almost 6 days-vancomycin/Flagyl/cefepime has all been discontinued.  She continues to be afebrile-and overall clinically improved.  Acute metabolic encephalopathy: Has essentially resolved-likely had uremia secondary to under dialysis with peritoneal dialysis.  Once HD was started-she has improved-suspect she is now back to her baseline.Extensive work-up including MRI brain, EEG were all negative.  TSH/vitamin B12/HIV/RPR serology negative.   Abdominal pain: Resolved.  Unclear etiology--abdomen is soft-but difficult to evaluate given encephalopathy.  Most imaging studies including RUQ ultrasound, CT of the abdomen without any acute abnormalities.  Continue  supportive care  Isolated elevation in AST: Unclear etiology-RUQ ultrasound without any obvious pathology-apart from possible cirrhosis.  Patient is s/p cholecystectomy.  AST seems to have plateaued and now downtrending.  GI following.  Acute hepatitis serology negative.    Hypokalemia: Repleted  ESRD on peritoneal dialysis: Initially on PD-but ends are now to transition to HD on discharge-clip in process initiated by nephrology.    History of liver cirrhosis secondary to NASH: See above regarding concerns about hepatic encephalopathy  CAD: No obvious anginal symptoms-continue aspirin  DM-2: CBGs stable-continue SSI  HTN: Blood pressure stable-continue to hold Coreg  Dyslipidemia: Continue to hold statin due to elevated AST  Morbid obesity-s/p gastric sleeve surgery  DVT Prophylaxis: Prophylactic Heparin   Code Status: Full code  Family Communication: Spoke with spouse over the phone on 4/6  Disposition Plan: Remain inpatient  Antimicrobial agents: Anti-infectives (From admission, onward)   Start     Dose/Rate Route Frequency Ordered Stop   04/21/18 1209  vancomycin variable dose per unstable renal function (pharmacist dosing)  Status:  Discontinued      Does not apply See admin instructions 04/21/18 1211 04/21/18 1314   04/19/18 1400  ceFEPIme (MAXIPIME) 1 g in sodium chloride 0.9 % 100 mL IVPB  Status:  Discontinued     1 g 200 mL/hr over 30 Minutes Intravenous Every 24 hours 04/18/18 1205 04/22/18 1429   04/18/18 2200  metroNIDAZOLE (FLAGYL) IVPB 500 mg  Status:  Discontinued     500 mg 100 mL/hr over 60 Minutes Intravenous Every 8 hours 04/18/18 1617 04/21/18 1133   04/18/18 1630  metroNIDAZOLE (FLAGYL) IVPB 500 mg     500 mg 100 mL/hr over 60 Minutes Intravenous  Once 04/18/18 1615 04/18/18 1959  04/18/18 1615  ceFEPIme (MAXIPIME) 2 g in sodium chloride 0.9 % 100 mL IVPB     2 g 200 mL/hr over 30 Minutes Intravenous  Once 04/18/18 1615 04/18/18 2008   04/18/18  1215  vancomycin (VANCOCIN) 2,500 mg in sodium chloride 0.9 % 500 mL IVPB     2,500 mg 250 mL/hr over 120 Minutes Intravenous  Once 04/18/18 1203 04/18/18 1527   04/18/18 1130  ceFEPIme (MAXIPIME) 2 g in sodium chloride 0.9 % 100 mL IVPB  Status:  Discontinued     2 g 200 mL/hr over 30 Minutes Intravenous  Once 04/18/18 1120 04/18/18 1614   04/18/18 1130  metroNIDAZOLE (FLAGYL) IVPB 500 mg  Status:  Discontinued     500 mg 100 mL/hr over 60 Minutes Intravenous  Once 04/18/18 1120 04/18/18 1614   04/18/18 1130  vancomycin (VANCOCIN) IVPB 1000 mg/200 mL premix  Status:  Discontinued     1,000 mg 200 mL/hr over 60 Minutes Intravenous  Once 04/18/18 1120 04/18/18 1126   04/18/18 1130  vancomycin (VANCOCIN) 2,000 mg in sodium chloride 0.9 % 500 mL IVPB  Status:  Discontinued     2,000 mg 250 mL/hr over 120 Minutes Intravenous  Once 04/18/18 1126 04/18/18 1203      Procedures: None  CONSULTS:  nephrology  Neurology Gastroenterology  Time spent: 25- minutes-Greater than 50% of this time was spent in counseling, explanation of diagnosis, planning of further management, and coordination of care.  MEDICATIONS: Scheduled Meds:  aspirin EC  81 mg Oral Daily   calcium acetate  1,334 mg Oral TID WC   Chlorhexidine Gluconate Cloth  6 each Topical Q0600   ferric citrate  210 mg Oral TID WC   folic acid  1 mg Oral Daily   gentamicin cream  1 application Topical Daily   guaiFENesin  600 mg Oral BID   heparin  5,000 Units Subcutaneous Q8H   insulin aspart  0-5 Units Subcutaneous QHS   insulin aspart  0-9 Units Subcutaneous TID WC   lactulose  20 g Oral TID   midodrine  10 mg Oral BID WC   pantoprazole  40 mg Oral Daily   thiamine  100 mg Oral Daily   Continuous Infusions:  sodium chloride 500 mL (04/18/18 1326)   albumin human Stopped (04/22/18 0554)   dialysis solution 1.5% low-MG/low-CA     sodium chloride     PRN Meds:.sodium chloride, acetaminophen, albumin  human, albuterol, camphor-menthol, ondansetron **OR** ondansetron (ZOFRAN) IV, sodium chloride   PHYSICAL EXAM: Vital signs: Vitals:   04/25/18 0820 04/25/18 0900 04/25/18 0930 04/25/18 1000  BP: 115/70 120/74 111/69 115/72  Pulse: 79 73 71 77  Resp: 16 16 15 16   Temp:      TempSrc:      SpO2:      Weight:      Height:       Filed Weights   04/24/18 1259 04/25/18 0446 04/25/18 0735  Weight: 112.3 kg 112.2 kg 112.2 kg   Body mass index is 41.16 kg/m.   General appearance:Awake, alert, not in any distress.  Eyes:no scleral icterus. HEENT: Atraumatic and Normocephalic Neck: supple, no JVD. Resp:Good air entry bilaterally,no rales or rhonchi CVS: S1 S2 regular GI: Bowel sounds present, Non tender and not distended with no gaurding, rigidity or rebound. Extremities: B/L Lower Ext shows no edema, both legs are warm to touch Neurology:  Non focal Musculoskeletal:No digital cyanosis Skin:No Rash, warm and dry Wounds:N/A  I have personally  reviewed following labs and imaging studies  LABORATORY DATA: CBC: Recent Labs  Lab 04/18/18 1143 04/19/18 0521 04/20/18 0025  04/22/18 0316 04/22/18 2213 04/23/18 0306 04/24/18 1037 04/25/18 0638  WBC 4.7 3.7* 4.3   < > 11.1*   10.8* 8.5 10.5 9.5 10.3  NEUTROABS 3.7 2.6 2.8  --  8.7*  --   --   --   --   HGB 12.0 11.3* 12.8   < > 11.6*   11.5* 10.8* 11.4* 10.1* 9.9*  HCT 37.1 35.2* 37.3   < > 34.6*   34.0* 32.6* 33.4* 32.3* 30.4*  MCV 98.7 97.8 96.4   < > 94.0   93.2 95.3 93.3 99.7 101.0*  PLT 205 181 184   < > 193   193 210 184 281 265   < > = values in this interval not displayed.    Basic Metabolic Panel: Recent Labs  Lab 04/18/18 1435  04/20/18 0007 04/20/18 0025  04/22/18 0316 04/22/18 2213 04/23/18 0306 04/24/18 1038 04/25/18 0638  NA  --    < > 132* 130*   < > 139 137 140 137 136  K  --    < > 2.8* 2.9*   < > 3.4* 3.5 4.1 3.6 3.4*  CL  --    < > 94* 91*   < > 100 100 101 98 98  CO2  --    < > 15* 18*   < > 21*  23 22 23 25   GLUCOSE  --    < > 141* 141*   < > 97 102* 99 130* 90  BUN  --    < > 44* 42*   < > 20 20 10 10 12   CREATININE  --    < > 16.88* 17.02*   < > 11.18* 9.35* 6.37* 4.85* 4.46*  CALCIUM  --    < > 7.0* 7.1*   < > 7.8* 7.9* 8.0* 8.4* 8.1*  MG 1.5*  --  1.7 1.7  --  1.8  --  1.9  --   --   PHOS 4.5  --   --   --   --   --  3.2  --  1.9* 1.6*   < > = values in this interval not displayed.    GFR: Estimated Creatinine Clearance: 18.2 mL/min (A) (by C-G formula based on SCr of 4.46 mg/dL (H)).  Liver Function Tests: Recent Labs  Lab 04/20/18 0025 04/21/18 8309 04/22/18 0316 04/22/18 2213 04/23/18 0306 04/24/18 1038 04/25/18 0451 04/25/18 0638  AST 464* 533* 426*  --  290*  --  100*  --   ALT 18 30 27   --  24  --  15  --   ALKPHOS 145* 129* 140*  --  143*  --  124  --   BILITOT 0.9 0.9 1.4*  --  1.4*  --  1.1  --   PROT 5.8* 5.6* 5.7*  --  5.9*  --  5.5*  --   ALBUMIN 2.5* 2.3* 2.9* 2.7* 2.8* 2.5* 2.5* 2.4*   Recent Labs  Lab 04/18/18 1143  LIPASE 26   Recent Labs  Lab 04/19/18 1133 04/20/18 0007 04/21/18 0512 04/22/18 0316 04/25/18 0451  AMMONIA 40* 43* 48* 16 24    Coagulation Profile: Recent Labs  Lab 04/21/18 0512  INR 1.3*    Cardiac Enzymes: No results for input(s): CKTOTAL, CKMB, CKMBINDEX, TROPONINI in the last 168 hours.  BNP (last 3 results) No  results for input(s): PROBNP in the last 8760 hours.  HbA1C: No results for input(s): HGBA1C in the last 72 hours.  CBG: Recent Labs  Lab 04/23/18 1723 04/23/18 2112 04/24/18 0745 04/24/18 1654 04/24/18 2242  GLUCAP 118* 93 99 94 88    Lipid Profile: No results for input(s): CHOL, HDL, LDLCALC, TRIG, CHOLHDL, LDLDIRECT in the last 72 hours.  Thyroid Function Tests: No results for input(s): TSH, T4TOTAL, FREET4, T3FREE, THYROIDAB in the last 72 hours.  Anemia Panel: No results for input(s): VITAMINB12, FOLATE, FERRITIN, TIBC, IRON, RETICCTPCT in the last 72 hours.  Urine analysis:     Component Value Date/Time   COLORURINE COLORLESS (A) 04/18/2018 1900   APPEARANCEUR CLEAR 04/18/2018 1900   LABSPEC 1.005 04/18/2018 1900   PHURINE 8.0 04/18/2018 1900   GLUCOSEU >=500 (A) 04/18/2018 1900   GLUCOSEU >=1000 (A) 10/28/2017 0839   HGBUR SMALL (A) 04/18/2018 1900   HGBUR negative 09/17/2009 0000   BILIRUBINUR NEGATIVE 04/18/2018 1900   KETONESUR NEGATIVE 04/18/2018 1900   PROTEINUR 30 (A) 04/18/2018 1900   UROBILINOGEN 0.2 10/28/2017 0839   NITRITE NEGATIVE 04/18/2018 1900   LEUKOCYTESUR NEGATIVE 04/18/2018 1900    Sepsis Labs: Lactic Acid, Venous    Component Value Date/Time   LATICACIDVEN 1.4 04/18/2018 1821    MICROBIOLOGY: Recent Results (from the past 875 hour(s))  Helicobacter pylori special antigen     Status: None   Collection Time: 04/18/18  8:06 AM  Result Value Ref Range Status   MICRO NUMBER: [id number]  Final   SPECIMEN QUALITY Adequate  Final   SOURCE: STOOL  Final   STATUS: FINAL  Final   RESULT:   Final    Not Detected  Antimicrobials, proton pump inhibitors, and bismuth preparations inhibit H. pylori and ingestion up to two weeks prior to testing may cause false negative results. If clinically indicated the test should be repeated on a new specimen  obtained two weeks after discontinuing treatment.   Blood Culture (routine x 2)     Status: None   Collection Time: 04/18/18 11:32 AM  Result Value Ref Range Status   Specimen Description BLOOD RIGHT FOREARM  Final   Special Requests   Final    BOTTLES DRAWN AEROBIC AND ANAEROBIC Blood Culture adequate volume   Culture   Final    NO GROWTH 5 DAYS Performed at [facility], [address], [place], [place] [address]    Report Status 04/23/2018 FINAL  Final  Blood Culture (routine x 2)     Status: None   Collection Time: 04/18/18 11:40 AM  Result Value Ref Range Status   Specimen Description BLOOD RIGHT ANTECUBITAL  Final   Special Requests   Final    BOTTLES DRAWN AEROBIC AND  ANAEROBIC Blood Culture adequate volume   Culture   Final    NO GROWTH 5 DAYS Performed at [facility], [place] [address] [place], [place] [address]    Report Status 04/23/2018 FINAL  Final  Culture, body fluid-bottle     Status: None   Collection Time: 04/18/18  1:47 PM  Result Value Ref Range Status   Specimen Description PERITONEAL CAVITY  Final   Special Requests NONE  Final   Culture   Final    NO GROWTH 5 DAYS Performed at [facility], [address], [place], [place] [address]    Report Status 04/23/2018 FINAL  Final  Gram stain     Status: None   Collection  Time: 04/18/18  1:47 PM  Result Value Ref Range Status   Specimen Description PERITONEAL CAVITY  Final   Special Requests NONE  Final   Gram Stain   Final    WBC PRESENT,BOTH PMN AND MONONUCLEAR NO ORGANISMS SEEN CYTOSPIN SMEAR Performed at [facility], [address] [place], [place] [address]    Report Status 04/18/2018 FINAL  Final  MRSA PCR Screening     Status: None   Collection Time: 04/18/18  2:37 PM  Result Value Ref Range Status   MRSA by PCR NEGATIVE NEGATIVE Final    Comment:        The GeneXpert MRSA Assay (FDA approved for NASAL specimens only), is one component of a comprehensive MRSA colonization surveillance program. It is not intended to diagnose MRSA infection nor to guide or monitor treatment for MRSA infections. Performed at [facility], [place] [address] [place], [place] [address]   Respiratory Panel by PCR     Status: None   Collection Time: 04/19/18 11:05 AM  Result Value Ref Range Status   Adenovirus NOT DETECTED NOT DETECTED Final   Coronavirus 229E NOT DETECTED NOT DETECTED Final    Comment: (NOTE) The Coronavirus on the Respiratory Panel, DOES NOT test for the novel  Coronavirus (2019 nCoV)    Coronavirus HKU1 NOT DETECTED NOT DETECTED Final   Coronavirus NL63 NOT DETECTED NOT DETECTED Final   Coronavirus OC43 NOT DETECTED NOT DETECTED Final    Metapneumovirus NOT DETECTED NOT DETECTED Final   Rhinovirus / Enterovirus NOT DETECTED NOT DETECTED Final   Influenza A NOT DETECTED NOT DETECTED Final   Influenza B NOT DETECTED NOT DETECTED Final   Parainfluenza Virus 1 NOT DETECTED NOT DETECTED Final   Parainfluenza Virus 2 NOT DETECTED NOT DETECTED Final   Parainfluenza Virus 3 NOT DETECTED NOT DETECTED Final   Parainfluenza Virus 4 NOT DETECTED NOT DETECTED Final   Respiratory Syncytial Virus NOT DETECTED NOT DETECTED Final   Bordetella pertussis NOT DETECTED NOT DETECTED Final   Chlamydophila pneumoniae NOT DETECTED NOT DETECTED Final   Mycoplasma pneumoniae NOT DETECTED NOT DETECTED Final    Comment: Performed at [facility], [place]. [address] [place],  [address]    RADIOLOGY STUDIES/RESULTS: Ct Abdomen Pelvis Wo Contrast  Result Date: 04/18/2018 CLINICAL DATA:  Fever a few days as recently completed treatment for staph peritonitis with last dose of Ancef given 04/13/2018. Fever this morning with vomiting and abdominal pain over the weekend. Patient with end-stage renal disease post dialysis this morning. EXAM: CT ABDOMEN AND PELVIS WITHOUT CONTRAST TECHNIQUE: Multidetector CT imaging of the abdomen and pelvis was performed following the standard protocol without IV contrast. COMPARISON:  04/09/2018, 03/27/2018 and 11/11/2017 as well as 02/05/2015 FINDINGS: Lower chest: Minimal linear atelectasis/scarring left base. Small sliding hiatal hernia. Hepatobiliary: Previous cholecystectomy. Liver and biliary tree are within normal. Pancreas: Normal. Spleen: Normal. Adrenals/Urinary Tract: Adrenal glands are normal. Kidneys are somewhat small without hydronephrosis or nephrolithiasis. Ureters and bladder are normal. Stomach/Bowel: Surgical sutures over the stomach compatible previous bypass procedure. Small bowel is normal. Appendix is normal. Mild diverticulosis over the descending colon and sigmoid colon. Vascular/Lymphatic: Minimal  calcified plaque over the abdominal aorta. No evidence of adenopathy. Reproductive: Normal. Other: Peritoneal dialysis catheter enters the lower abdomen just right of midline. Tip over the midline pelvis. Mild amount of free peritoneal fluid likely related to patient's peritoneal dialysis. Tiny amount of free peritoneal air also likely related to patient's peritoneal  dialysis as this is unchanged. No focal inflammatory process. Musculoskeletal: Unchanged. IMPRESSION: Mild amount of free peritoneal fluid and tiny amount of free peritoneal air likely related patient's peritoneal dialysis. Dialysis catheter over the midline pelvis without significant change. Diverticulosis of the colon without active inflammation. Postsurgical changes as described. Electronically Signed   By: Marin Olp M.D.   On: 04/18/2018 15:50   X-ray Chest Pa And Lateral  Result Date: 04/19/2018 CLINICAL DATA:  Shortness of breath, nausea EXAM: CHEST - 2 VIEW COMPARISON:  04/18/2018 FINDINGS: Linear left base atelectasis or scarring. Heart is upper limits normal in size. Right lung clear. No effusions or acute bony abnormality. IMPRESSION: Left basilar atelectasis or scarring. Electronically Signed   By: Rolm Baptise M.D.   On: 04/19/2018 07:47   Mr Brain Wo Contrast  Result Date: 04/21/2018 CLINICAL DATA:  Initial evaluation for acute altered mental status, encephalopathy. EXAM: MRI HEAD WITHOUT CONTRAST TECHNIQUE: Multiplanar, multiecho pulse sequences of the brain and surrounding structures were obtained without intravenous contrast. COMPARISON:  Prior MRI from 08/14/2016. FINDINGS: Brain: Examination technically limited as the patient was unable to tolerate the full length of the exam. DWI sequences with axial FLAIR sequence only was performed. Additionally, images provided are somewhat degraded by motion artifact. Cerebral volume within normal limits for age. Few scattered patchy subcentimeter FLAIR hyperintensities noted within the  supratentorial cerebral white matter, nonspecific, but fairly mild for age and of doubtful significance in the acute setting. No mass lesion, midline shift, or mass effect. No hydrocephalus. No extra-axial fluid collection. No appreciable areas of chronic infarction. No imaging findings to suggest acute intracranial hemorrhage on this limited exam. No evidence for acute or subacute ischemia on DWI sequences. Vascular: Not well assessed on this limited exam. Skull and upper cervical spine: Calvarium intact. No focal marrow replacing lesion. No scalp soft tissue abnormality. Sinuses/Orbits: Globes orbital soft tissues grossly unremarkable. Paranasal sinuses are largely clear. No appreciable mastoid effusion. Other: None. IMPRESSION: 1. Technically limited exam due to patient's inability to tolerate the full length of the study. 2. Grossly negative MRI appearance of the brain. No evidence for acute intracranial infarct or other definite abnormality. Electronically Signed   By: Jeannine Boga M.D.   On: 04/21/2018 13:35   Ct Abdomen Pelvis W Contrast  Result Date: 04/09/2018 CLINICAL DATA:  Nausea and vomiting and abdominal pain EXAM: CT ABDOMEN AND PELVIS WITH CONTRAST TECHNIQUE: Multidetector CT imaging of the abdomen and pelvis was performed using the standard protocol following bolus administration of intravenous contrast. CONTRAST:  162m OMNIPAQUE IOHEXOL 300 MG/ML  SOLN COMPARISON:  03/27/2018 FINDINGS: Lower chest: No acute abnormality. Hepatobiliary: Diffuse fatty infiltration of the liver is noted. No focal mass is seen. The gallbladder has been surgically removed. Pancreas: Unremarkable. No pancreatic ductal dilatation or surrounding inflammatory changes. Spleen: Normal in size without focal abnormality. Adrenals/Urinary Tract: Adrenal glands are within normal limits. Renal vascular calcifications are noted. No obstructive changes are seen. The ureters are within normal limits bilaterally. The  bladder is decompressed. Stomach/Bowel: Scattered diverticular changes noted within the colon. No evidence of diverticulitis is seen. The appendix is within normal limits. No obstructive or inflammatory changes of the small bowel are noted. Changes of prior sleeve gastrectomy are seen. Small sliding-type hiatal hernia is noted. Vascular/Lymphatic: Aortic atherosclerosis. No enlarged abdominal or pelvic lymph nodes. Reproductive: Uterus and bilateral adnexa are unremarkable. Other: Peritoneal dialysis catheter is noted deep within the pelvis. Small amount of free fluid is noted within the pelvis  associated with the catheter. Additionally there are a few foci of air identified in the upper abdomen adjacent to the spleen and stomach. These are likely related to the peritoneal dialysis catheter. By history the patient is receiving treatment for peritonitis. Musculoskeletal: No acute bony abnormality is seen. IMPRESSION: Minimal free air and free fluid within the abdomen likely related to the patient's known peritoneal dialysis catheter. No definitive evidence to suggest perforation are seen. Diverticulosis without diverticulitis. Fatty liver. Electronically Signed   By: Inez Catalina M.D.   On: 04/09/2018 16:50   Ct Abdomen Pelvis W Contrast  Result Date: 03/27/2018 CLINICAL DATA:  Lower abdominal pain starting Friday. Patient does dialysis every day at home. Prior gastric sleeve, peritoneal catheter placement and cholecystectomy. EXAM: CT ABDOMEN AND PELVIS WITH CONTRAST TECHNIQUE: Multidetector CT imaging of the abdomen and pelvis was performed using the standard protocol following bolus administration of intravenous contrast. CONTRAST:  179m OMNIPAQUE IOHEXOL 300 MG/ML  SOLN COMPARISON:  11/11/2017 CT FINDINGS: Lower chest: Top-normal heart size with coronary arteriosclerosis. No pericardial effusion or thickening. Clear lung bases with left basilar atelectasis. Hepatobiliary: Steatosis of the liver. More focal  fatty infiltration adjacent to the falciform ligament. Surface nodularity of the liver compatible morphologic changes of cirrhosis. No space-occupying mass of the liver is seen. The patient is status post cholecystectomy. Pancreas: No pancreatic mass or ductal dilatation.  No inflammation. Spleen: Normal Adrenals/Urinary Tract: Normal bilateral adrenal glands. Mild bilateral renal atrophy and cortical thinning. No obstructive uropathy, mass or nephrolithiasis. The urinary bladder is unremarkable for the degree of distention. It is slightly thick-walled but this is likely due to underdistention. Cystitis is believed less likely. Stomach/Bowel: Gastric sleeve procedure without complicating features. Small hiatal hernia is noted. Normal small bowel rotation without obstruction or inflammation. Sigmoid diverticulosis without acute diverticulitis. The appendix is normal caliber with probable small amount of retained contrast noted. Vascular/Lymphatic: Aortoiliac atherosclerosis. No adenopathy by CT size criteria. Reproductive: Uterus and bilateral adnexa are unremarkable. Other: Dialysis catheter is seen coiled within the mid pelvis. Musculoskeletal: No acute nor suspicious osseous abnormality. IMPRESSION: 1. Steatosis of the liver with slight morphologic changes of cirrhosis given surface nodularity of the liver. No space-occupying mass is seen. 2. Status post cholecystectomy. 3. Gastric sleeve procedure without complicating features. 4. Sigmoid diverticulosis without acute diverticulitis. 5. Percutaneous peritoneal dialysis catheter is seen coiled pelvis without complicating features. Electronically Signed   By: DAshley RoyaltyM.D.   On: 03/27/2018 20:55   Dg Chest Port 1 View  Result Date: 04/18/2018 CLINICAL DATA:  Fever EXAM: PORTABLE CHEST 1 VIEW COMPARISON:  03/16/2018 FINDINGS: The heart size and mediastinal contours are within normal limits. Both lungs are clear. The visualized skeletal structures are  unremarkable. IMPRESSION: No active disease. Electronically Signed   By: HKathreen Devoid  On: 04/18/2018 11:31   UKoreaAbdomen Limited Ruq  Result Date: 04/19/2018 CLINICAL DATA:  Abnormal LFTs. EXAM: ULTRASOUND ABDOMEN LIMITED RIGHT UPPER QUADRANT COMPARISON:  CT abdomen pelvis from yesterday. FINDINGS: Gallbladder: Surgically absent. Common bile duct: Diameter: 4 mm, normal. Liver: No focal lesion identified. Subtle nodular contour. Coarsened, heterogeneous parenchymal echogenicity. Portal vein is patent on color Doppler imaging with normal direction of blood flow towards the liver. IMPRESSION: 1. Subtle liver surface nodularity with coarsened, heterogeneous parenchymal echogenicity, suspicious for cirrhosis. Electronically Signed   By: WTitus DubinM.D.   On: 04/19/2018 11:40     LOS: 7 days   SOren Binet MD  [facility]  If 7PM-7AM,  please contact night-coverage  Please page via [email or web address]  Go to [email or web address] and use [place]'s universal password to access. If you do not have the password, please contact the hospital operator.  Locate the [facility] provider you are looking for under [facility] and page to a number that you can be directly reached. If you still have difficulty reaching the provider, please page the [facility] (Director on Call) for the Hospitalists listed on amion for assistance.  04/25/2018, 10:15 AM

## 2018-04-26 LAB — RENAL FUNCTION PANEL
Albumin: 2.4 g/dL — ABNORMAL LOW (ref 3.5–5.0)
Anion gap: 15 (ref 5–15)
BUN: 11 mg/dL (ref 6–20)
CO2: 23 mmol/L (ref 22–32)
Calcium: 8.2 mg/dL — ABNORMAL LOW (ref 8.9–10.3)
Chloride: 98 mmol/L (ref 98–111)
Creatinine, Ser: 4.32 mg/dL — ABNORMAL HIGH (ref 0.44–1.00)
GFR calc Af Amer: 13 mL/min — ABNORMAL LOW (ref >60)
GFR calc non Af Amer: 11 mL/min — ABNORMAL LOW (ref >60)
Glucose, Bld: 78 mg/dL (ref 70–99)
Phosphorus: 1.4 mg/dL — ABNORMAL LOW (ref 2.5–4.6)
Potassium: 3.6 mmol/L (ref 3.5–5.1)
Sodium: 136 mmol/L (ref 135–145)

## 2018-04-26 LAB — GLUCOSE, CAPILLARY: Glucose-Capillary: 117 mg/dL — ABNORMAL HIGH (ref 70–99)

## 2018-04-26 MED ORDER — CINACALCET HCL 30 MG PO TABS: 30.0000 mg | ORAL_TABLET | Freq: Every day | ORAL | 0 refills | Status: DC

## 2018-04-26 NOTE — Progress Notes (Signed)
[facility] Progress Note   Subjective: No C/Os. Ready to go home. Has been CLIPPED to [facility].     Objective Vitals:   04/25/18 2042 04/25/18 2108 04/26/18 0500 04/26/18 0540  BP:  118/82  116/73  Pulse:    67  Resp:  16  16  Temp: 98.6 F (37 C)   98.3 F (36.8 C)  TempSrc:    Oral  SpO2:    100%  Weight:   109.5 kg   Height:       Physical Exam General: WN,WD in NAD Heart: S1,S2 RRR Lungs: Bilateral breath sounds decreased in bases otherwise CTAB Abdomen: Active BS. PD catheter RUQ of abd, drsg CDI Extremities: No LE edema Dialysis Access: L AVF + bruit   Additional Objective Labs: Basic Metabolic Panel: Recent Labs  Lab 04/24/18 1038 04/25/18 0638 04/26/18 0236  NA 137 136 136  K 3.6 3.4* 3.6  CL 98 98 98  CO2 23 25 23   GLUCOSE 130* 90 78  BUN 10 12 11   CREATININE 4.85* 4.46* 4.32*  CALCIUM 8.4* 8.1* 8.2*  PHOS 1.9* 1.6* 1.4*   Liver Function Tests: Recent Labs  Lab 04/22/18 0316  04/23/18 0306  04/25/18 0451 04/25/18 0638 04/26/18 0236  AST 426*  --  290*  --  100*  --   --   ALT 27  --  24  --  15  --   --   ALKPHOS 140*  --  143*  --  124  --   --   BILITOT 1.4*  --  1.4*  --  1.1  --   --   PROT 5.7*  --  5.9*  --  5.5*  --   --   ALBUMIN 2.9*   < > 2.8*   < > 2.5* 2.4* 2.4*   < > = values in this interval not displayed.   No results for input(s): LIPASE, AMYLASE in the last 168 hours. CBC: Recent Labs  Lab 04/20/18 0025  04/22/18 0316 04/22/18 2213 04/23/18 0306 04/24/18 1037 04/25/18 0638  WBC 4.3   < > 11.1*  10.8* 8.5 10.5 9.5 10.3  NEUTROABS 2.8  --  8.7*  --   --   --   --   HGB 12.8   < > 11.6*  11.5* 10.8* 11.4* 10.1* 9.9*  HCT 37.3   < > 34.6*  34.0* 32.6* 33.4* 32.3* 30.4*  MCV 96.4   < > 94.0  93.2 95.3 93.3 99.7 101.0*  PLT 184   < > 193  193 210 184 281 265   < > = values in this interval not displayed.   Blood Culture    Component Value Date/Time   SDES PERITONEAL CAVITY 04/18/2018 1347   SDES  PERITONEAL CAVITY 04/18/2018 1347   SPECREQUEST NONE 04/18/2018 1347   SPECREQUEST NONE 04/18/2018 1347   CULT  04/18/2018 1347    NO GROWTH 5 DAYS Performed at [facility], [place] [address] [place], [place] [address]    REPTSTATUS 04/23/2018 FINAL 04/18/2018 1347   REPTSTATUS 04/18/2018 FINAL 04/18/2018 1347    Cardiac Enzymes: No results for input(s): CKTOTAL, CKMB, CKMBINDEX, TROPONINI in the last 168 hours. CBG: Recent Labs  Lab 04/25/18 1204 04/25/18 1222 04/25/18 1701 04/25/18 2118 04/26/18 0757  GLUCAP 23* 114* 130* 90 117*   Iron Studies: No results for input(s): IRON, TIBC, TRANSFERRIN, FERRITIN in the last 72 hours. @lablastinr3 @ Studies/Results: No results found. Medications: . sodium chloride  500 mL (04/18/18 1326)  . albumin human Stopped (04/22/18 0554)  . dialysis solution 1.5% low-MG/low-CA    . sodium chloride     . aspirin EC  81 mg Oral Daily  . Chlorhexidine Gluconate Cloth  6 each Topical Q0600  . cinacalcet  30 mg Oral Q supper  . feeding supplement (PRO-STAT SUGAR FREE 64)  30 mL Oral BID  . ferric citrate  210 mg Oral TID WC  . folic acid  1 mg Oral Daily  . gentamicin cream  1 application Topical Daily  . guaiFENesin  600 mg Oral BID  . heparin  5,000 Units Subcutaneous Q8H  . insulin aspart  0-5 Units Subcutaneous QHS  . insulin aspart  0-9 Units Subcutaneous TID WC  . lactulose  20 g Oral TID  . midodrine  10 mg Oral BID WC  . pantoprazole  40 mg Oral Daily  . thiamine  100 mg Oral Daily     Dialysis Orders: CCPD edw 116.5 6 exchnage 2.5 L dwell 1.5 hr dry day  Recent hgb 12 ipTH 866 Calcitriol 0.5/day, sensipar 60 qod, Aurixya 2 ac phoslo 3 ac  Assessment/Plan: 1. ESRDon CCPD:uremic from underdialysis on PD. Now back to baseline after serial HD.Will need transition back to HD. Patient attended [facility] prior to starting PD.Has been CLIPPED  to [facility] MWF. Needs to be at center 7 AM.Pt has L AVF-higher venous pressures but no  issues with cannulation. May need fistulagram as OP.  2. Altered mental status:as above. Patient is back to baseline.  3. Fever -Afebrile past 24 hours.hx recent staph warneri peritonitistx Ancef IP. WBC cell count here only 4 on 3/30 so doubt peritonitis.Abd CT negative,RVP negative, no positive cultures, high procalcitonin. Fevers gradually have resolved, afeb now.Abx dc'd on 4/2. 4. BP/volume-BP controlled today. Post wt yesterday 111.7 kg.  5. Elevated LFTs-AST 216 >287 >464 >533 (was 24 03/23/18 at HD unit) other LFT ok-recent AST had been normal in early March - NH3 48. GI evaluating. Acute hepatitis panel negative.  6. Anemia ckd- hgb 11- 12 on adm. 9.9 today. Follow HGB.  7. MBD ckd- CA 8.1 C Ca 9.1 Phos 1.6. DC binders, recheck Phos tomorrow.  Change sensipar dose to 30 mg PO q day.  8. Nutrition- alb 2.4 - carb mod, add prostat. Phos is low-liberalize diet to include dairy products 9. DM-per primary  Jimmye Norman. Berdell Nevitt NP-C 04/26/2018, 9:46 AM  [employer] [phone]

## 2018-04-26 NOTE — Progress Notes (Signed)
Patient has OP HD seat secured at [facility] with MWF 7:40am schedule. Patient needs to arrive for her first appointment at 7:00am on April 27, 2018 to complete paperwork. Dialysis Coordinator communicated this to Juanell Fairly, NP and patient's husband. Patient's husband asked Dialysis Coordinator for a shower chair. Dialysis Coordinator informed him that this is not something DC can assist him with, but that DC will contact hospital case manager to speak to them about this. DC called Levada Dy Cole/CM who states she will follow up with patient and husband.   Alphonzo Cruise Dialysis Coordinator [phone]

## 2018-04-26 NOTE — Progress Notes (Signed)
Physical Therapy Treatment Patient Details Name: Brenda Davis MRN: [id number] DOB: 1965/02/27 Today's Date: 04/26/2018    History of Present Illness Patient is a 53 y.o. female history of ESRD on PD, HTN, dyslipidemia, DM-2-recent history of staph ordinary peritonitis (completed IV Ancef on 3/25),liver cirrhoses 2/2 NASH-presenting to the hospital with abdominal pain and fever.  Peritonitis fluid analysis negative, CT abdomen negative, MRI negative with workup for acute metabolic encephalopathy.     PT Comments    Patient progressing slowly towards PT goals. Assisted pt with getting dressed to discharge home today. Requires use of RW for support during ambulation and limited with distance secondary to SOB, weakness and poor balance. Pt with LOB requiring Min A to correct. Pt with impaired endurance and activity tolerance as well. Discussed fall reduction techniques at home- throw rugs, taking rest breaks as needed, placing chairs around house, using RW etc. Pt has support from spouse at home. Recommend RW and BSC. Will follow.    Follow Up Recommendations  Home health PT;Supervision for mobility/OOB     Equipment Recommendations  Rolling walker with 5" wheels;3in1 (PT)    Recommendations for Other Services       Precautions / Restrictions Precautions Precautions: Fall Precaution Comments: soft BP Restrictions Weight Bearing Restrictions: No    Mobility  Bed Mobility               General bed mobility comments: Sitting EOB upon PT arrival.   Transfers Overall transfer level: Needs assistance Equipment used: Rolling walker (2 wheeled) Transfers: Sit to/from Stand Sit to Stand: Min guard         General transfer comment: Min guard for safety. Stood from [employer], from chair x3.   Ambulation/Gait Ambulation/Gait assistance: Min assist Gait Distance (Feet): 10 Feet Assistive device: Rolling walker (2 wheeled) Gait Pattern/deviations: Step-through  pattern;Decreased stride length;Trunk flexed;Wide base of support Gait velocity: decreased Gait velocity interpretation: <1.31 ft/sec, indicative of household ambulator General Gait Details: Slow, unsteady gait with staggering noted with pt quickly sitting down without warning; cues for RW management. + SOB and dizziness.    Stairs             Wheelchair Mobility    Modified Rankin (Stroke Patients Only)       Balance Overall balance assessment: Needs assistance Sitting-balance support: No upper extremity supported;Feet supported Sitting balance-Leahy Scale: Fair Sitting balance - Comments: Able to donn shirt sitting EOB without difficulty.    Standing balance support: During functional activity Standing balance-Leahy Scale: Poor Standing balance comment: reliant on B UE and external support. Able to stand and pull up pants wtihout difficulty with close Min guard, marching in standing ~30 seconds with RW for support.                             Cognition Arousal/Alertness: Awake/alert Behavior During Therapy: WFL for tasks assessed/performed Overall Cognitive Status: Impaired/Different from baseline Area of Impairment: Attention;Awareness;Problem solving                   Current Attention Level: Sustained       Awareness: Emergent Problem Solving: Slow processing;Decreased initiation;Difficulty sequencing;Requires verbal cues General Comments: pt oriented, requires increased time to process and follow multi step commands.      Exercises      General Comments        Pertinent Vitals/Pain Pain Assessment: No/denies pain    Home Living  Prior Function            PT Goals (current goals can now be found in the care plan section) Progress towards PT goals: Progressing toward goals    Frequency    Min 3X/week      PT Plan Current plan remains appropriate;Equipment recommendations need to be updated     Co-evaluation              AM-PAC PT "6 Clicks" Mobility   Outcome Measure  Help needed turning from your back to your side while in a flat bed without using bedrails?: A Little Help needed moving from lying on your back to sitting on the side of a flat bed without using bedrails?: A Little Help needed moving to and from a bed to a chair (including a wheelchair)?: A Little Help needed standing up from a chair using your arms (e.g., wheelchair or bedside chair)?: A Little Help needed to walk in hospital room?: A Little Help needed climbing 3-5 steps with a railing? : A Little 6 Click Score: 18    End of Session Equipment Utilized During Treatment: Gait belt Activity Tolerance: Patient tolerated treatment well;Treatment limited secondary to medical complications (Comment)(dizziness. ) Patient left: with call bell/phone within reach;in bed Nurse Communication: Mobility status PT Visit Diagnosis: Unsteadiness on feet (R26.81);Muscle weakness (generalized) (M62.81);Difficulty in walking, not elsewhere classified (R26.2)     Time: 2072-1828 PT Time Calculation (min) (ACUTE ONLY): 15 min  Charges:  $Therapeutic Activity: 8-22 mins                     Wray Kearns, PT, DPT Acute Rehabilitation Services Pager [phone] Office [place] 04/26/2018, 11:39 AM

## 2018-04-26 NOTE — Discharge Summary (Signed)
PATIENT DETAILS Name: Brenda Davis Age: 53 y.o. Sex: female Date of Birth: [birth year] MRN: [id number]. Admitting Physician: Kerney Elbe, DO LPF:[id number], Arvid Right, MD  Admit Date: 04/18/2018 Discharge date: 04/26/2018  Recommendations for Outpatient Follow-up:  1. Follow up with PCP in 1-2 weeks 2. Please obtain BMP/CBC in one week  Admitted From:  Home  Disposition: Home with home health services   [place]:  Yes  Equipment/Devices: None  Discharge Condition: Stable  CODE STATUS: FULL CODE  Diet recommendation:  Heart Healthy  Brief Summary: See H&P, Labs, Consult and Test reports for all details in brief,Patient is a 53 y.o. female history of ESRD on PD, HTN, dyslipidemia, DM-2-recent history of staph ordinary peritonitis (completed IV Ancef on 3/25),liver cirrhoses 2/2 NASH-presenting to the hospital with abdominal pain and fever.  Initially there was some concern for peritonitis-and patient was started on empiric antimicrobial therapy, however peritoneal fluid analysis was not consistent with peritonitis.  Hospital course complicated by development of acute metabolic encephalopathy.  See below for further details  Brief Hospital Course: Sepsis: Sepsis physiology has improved.  Etiology unclear-initially concern for peritonitis given abdominal pain-but peritoneal fluid analysis not consistent with peritonitis.  Blood cultures negative, CT abdomen without any other source of infection, chest x-ray without any infection.  Respiratory virus panel negative.  Has now been afebrile for almost 7days-vancomycin/Flagyl/cefepime has all been discontinued.  She continues to be afebrile-and overall clinically improved.  Acute metabolic encephalopathy: Has essentially resolved-due to uremia secondary to under dialysis with peritoneal dialysis.  Once HD was started-she has improved-suspect she is now back to her baseline.Extensive work-up including MRI brain, EEG  were all negative.  TSH/vitamin B12/HIV/RPR serology negative.   Abdominal pain: Resolved.  Unclear etiology--abdomen is soft-but difficult to evaluate given encephalopathy.  Most imaging studies including RUQ ultrasound, CT of the abdomen without any acute abnormalities.  Continue supportive care  Isolated elevation in AST: Unclear etiology-RUQ ultrasound without any obvious pathology-apart from possible cirrhosis.  Patient is s/p cholecystectomy.  AST seems to have plateaued and now downtrending.  GI following.  Acute hepatitis serology negative.    Hypokalemia: Repleted  ESRD on peritoneal dialysis: Initially on PD-but ends are now to transition to HD on discharge-clip in process initiated by nephrology.    History of liver cirrhosis secondary to NASH: See above regarding concerns about hepatic encephalopathy  CAD: No obvious anginal symptoms-continue aspirin  HTN: Blood pressure stable-continue to hold Coreg  Dyslipidemia: Continue to hold statin due to elevated AST  Morbid obesity-s/p gastric sleeve surgery  Procedures/Studies: None  Discharge Diagnoses:  Active Problems:   Dyslipidemia, goal LDL below 70   S/P laparoscopic sleeve gastrectomy   B12 deficiency anemia   Gout due to renal impairment   Anemia of chronic disease   ESRD on peritoneal dialysis (HCC)   DM (diabetes mellitus), type 2 with renal complications ([place])   Hypertension   Folate deficiency   Dialysis-associated peritonitis ([place])   Coronary artery disease due to lipid rich plaque   Peritonitis (HCC)   Dyspnea on exertion   Abnormal LFTs   Discharge Instructions:  Activity:  As tolerated with Full fall precautions use walker/cane & assistance as needed  Discharge Instructions    Diet - low sodium heart healthy   Complete by:  As directed    Discharge instructions   Complete by:  As directed    Follow with Primary MD  [name], MD in 1 week  Follow at  hemodialysis per  schedule  Please get a complete blood count and chemistry panel checked by your Primary MD at your next visit, and again as instructed by your Primary MD.  Get Medicines reviewed and adjusted: Please take all your medications with you for your next visit with your Primary MD  Laboratory/radiological data: Please request your Primary MD to go over all hospital tests and procedure/radiological results at the follow up, please ask your Primary MD to get all Hospital records sent to his/her office.  In some cases, they will be blood work, cultures and biopsy results pending at the time of your discharge. Please request that your primary care M.D. follows up on these results.  Also Note the following: If you experience worsening of your admission symptoms, develop shortness of breath, life threatening emergency, suicidal or homicidal thoughts you must seek medical attention immediately by calling 911 or calling your MD immediately  if symptoms less severe.  You must read complete instructions/literature along with all the possible adverse reactions/side effects for all the Medicines you take and that have been prescribed to you. Take any new Medicines after you have completely understood and accpet all the possible adverse reactions/side effects.   Do not drive when taking Pain medications or sleeping medications (Benzodaizepines)  Do not take more than prescribed Pain, Sleep and Anxiety Medications. It is not advisable to combine anxiety,sleep and pain medications without talking with your primary care practitioner  Special Instructions: If you have smoked or chewed Tobacco  in the last 2 yrs please stop smoking, stop any regular Alcohol  and or any Recreational drug use.  Wear Seat belts while driving.  Please note: You were cared for by a hospitalist during your hospital stay. Once you are discharged, your primary care physician will handle any further medical issues. Please note that NO  REFILLS for any discharge medications will be authorized once you are discharged, as it is imperative that you return to your primary care physician (or establish a relationship with a primary care physician if you do not have one) for your post hospital discharge needs so that they can reassess your need for medications and monitor your lab values.   Increase activity slowly   Complete by:  As directed      Allergies as of 04/26/2018      Reactions   Amlodipine Swelling   Clonidine Derivatives Swelling   Limbs swell   Doxycycline Nausea And Vomiting   "I threw up for 3 hours"   Welchol [colesevelam Hcl] Nausea Only      Medication List    STOP taking these medications   calcium acetate 667 MG capsule Commonly known as:  PHOSLO   oxyCODONE 5 MG immediate release tablet Commonly known as:  Oxy IR/ROXICODONE     TAKE these medications   acetaminophen 325 MG tablet Commonly known as:  TYLENOL Take 650 mg by mouth every 6 (six) hours as needed for mild pain.   aspirin 81 MG EC tablet Take 1 tablet (81 mg total) by mouth daily.   atorvastatin 80 MG tablet Commonly known as:  LIPITOR Take 1 tablet (80 mg total) by mouth daily at 6 PM.   Auryxia 1 GM 210 MG(Fe) tablet Generic drug:  ferric citrate Take 210 mg by mouth 3 (three) times daily with meals.   B-12 PO Take 1 tablet by mouth daily.   carvedilol 3.125 MG tablet Commonly known as:  COREG Take 1 tablet (3.125 mg total) by  mouth 2 (two) times daily with a meal.   cinacalcet 30 MG tablet Commonly known as:  SENSIPAR Take 1 tablet (30 mg total) by mouth daily with supper. What changed:  how much to take   folic acid 1 MG tablet Commonly known as:  FOLVITE Take 1 tablet (1 mg total) by mouth daily.   gentamicin cream 0.1 % Commonly known as:  GARAMYCIN Apply 1 application topically See admin instructions. Apply to exit site daily as directed.   ondansetron 8 MG disintegrating tablet Commonly known as:  Zofran  ODT Take 1 tablet (8 mg total) by mouth every 8 (eight) hours as needed for nausea or vomiting.      Follow-up Information    [name], MD. Schedule an appointment as soon as possible for a visit in 1 week(s).   Specialty:  Internal Medicine Contact information: [address] [place] [address] [phone]        Hemodialysis center Follow up.   Why:  MWF         Allergies  Allergen Reactions   Amlodipine Swelling   Clonidine Derivatives Swelling    Limbs swell   Doxycycline Nausea And Vomiting    "I threw up for 3 hours"   Welchol [Colesevelam Hcl] Nausea Only    Consultations:  GI,RENAL, NEURO   Other Procedures/Studies: Ct Abdomen Pelvis Wo Contrast  Result Date: 04/18/2018 CLINICAL DATA:  Fever a few days as recently completed treatment for staph peritonitis with last dose of Ancef given 04/13/2018. Fever this morning with vomiting and abdominal pain over the weekend. Patient with end-stage renal disease post dialysis this morning. EXAM: CT ABDOMEN AND PELVIS WITHOUT CONTRAST TECHNIQUE: Multidetector CT imaging of the abdomen and pelvis was performed following the standard protocol without IV contrast. COMPARISON:  04/09/2018, 03/27/2018 and 11/11/2017 as well as 02/05/2015 FINDINGS: Lower chest: Minimal linear atelectasis/scarring left base. Small sliding hiatal hernia. Hepatobiliary: Previous cholecystectomy. Liver and biliary tree are within normal. Pancreas: Normal. Spleen: Normal. Adrenals/Urinary Tract: Adrenal glands are normal. Kidneys are somewhat small without hydronephrosis or nephrolithiasis. Ureters and bladder are normal. Stomach/Bowel: Surgical sutures over the stomach compatible previous bypass procedure. Small bowel is normal. Appendix is normal. Mild diverticulosis over the descending colon and sigmoid colon. Vascular/Lymphatic: Minimal calcified plaque over the abdominal aorta. No evidence of adenopathy. Reproductive:  Normal. Other: Peritoneal dialysis catheter enters the lower abdomen just right of midline. Tip over the midline pelvis. Mild amount of free peritoneal fluid likely related to patient's peritoneal dialysis. Tiny amount of free peritoneal air also likely related to patient's peritoneal dialysis as this is unchanged. No focal inflammatory process. Musculoskeletal: Unchanged. IMPRESSION: Mild amount of free peritoneal fluid and tiny amount of free peritoneal air likely related patient's peritoneal dialysis. Dialysis catheter over the midline pelvis without significant change. Diverticulosis of the colon without active inflammation. Postsurgical changes as described. Electronically Signed   By: Marin Olp M.D.   On: 04/18/2018 15:50   X-ray Chest Pa And Lateral  Result Date: 04/19/2018 CLINICAL DATA:  Shortness of breath, nausea EXAM: CHEST - 2 VIEW COMPARISON:  04/18/2018 FINDINGS: Linear left base atelectasis or scarring. Heart is upper limits normal in size. Right lung clear. No effusions or acute bony abnormality. IMPRESSION: Left basilar atelectasis or scarring. Electronically Signed   By: Rolm Baptise M.D.   On: 04/19/2018 07:47   Mr Brain Wo Contrast  Result Date: 04/21/2018 CLINICAL DATA:  Initial evaluation for acute altered mental status,  encephalopathy. EXAM: MRI HEAD WITHOUT CONTRAST TECHNIQUE: Multiplanar, multiecho pulse sequences of the brain and surrounding structures were obtained without intravenous contrast. COMPARISON:  Prior MRI from 08/14/2016. FINDINGS: Brain: Examination technically limited as the patient was unable to tolerate the full length of the exam. DWI sequences with axial FLAIR sequence only was performed. Additionally, images provided are somewhat degraded by motion artifact. Cerebral volume within normal limits for age. Few scattered patchy subcentimeter FLAIR hyperintensities noted within the supratentorial cerebral white matter, nonspecific, but fairly mild for age and of  doubtful significance in the acute setting. No mass lesion, midline shift, or mass effect. No hydrocephalus. No extra-axial fluid collection. No appreciable areas of chronic infarction. No imaging findings to suggest acute intracranial hemorrhage on this limited exam. No evidence for acute or subacute ischemia on DWI sequences. Vascular: Not well assessed on this limited exam. Skull and upper cervical spine: Calvarium intact. No focal marrow replacing lesion. No scalp soft tissue abnormality. Sinuses/Orbits: Globes orbital soft tissues grossly unremarkable. Paranasal sinuses are largely clear. No appreciable mastoid effusion. Other: None. IMPRESSION: 1. Technically limited exam due to patient's inability to tolerate the full length of the study. 2. Grossly negative MRI appearance of the brain. No evidence for acute intracranial infarct or other definite abnormality. Electronically Signed   By: Jeannine Boga M.D.   On: 04/21/2018 13:35   Ct Abdomen Pelvis W Contrast  Result Date: 04/09/2018 CLINICAL DATA:  Nausea and vomiting and abdominal pain EXAM: CT ABDOMEN AND PELVIS WITH CONTRAST TECHNIQUE: Multidetector CT imaging of the abdomen and pelvis was performed using the standard protocol following bolus administration of intravenous contrast. CONTRAST:  111m OMNIPAQUE IOHEXOL 300 MG/ML  SOLN COMPARISON:  03/27/2018 FINDINGS: Lower chest: No acute abnormality. Hepatobiliary: Diffuse fatty infiltration of the liver is noted. No focal mass is seen. The gallbladder has been surgically removed. Pancreas: Unremarkable. No pancreatic ductal dilatation or surrounding inflammatory changes. Spleen: Normal in size without focal abnormality. Adrenals/Urinary Tract: Adrenal glands are within normal limits. Renal vascular calcifications are noted. No obstructive changes are seen. The ureters are within normal limits bilaterally. The bladder is decompressed. Stomach/Bowel: Scattered diverticular changes noted within the  colon. No evidence of diverticulitis is seen. The appendix is within normal limits. No obstructive or inflammatory changes of the small bowel are noted. Changes of prior sleeve gastrectomy are seen. Small sliding-type hiatal hernia is noted. Vascular/Lymphatic: Aortic atherosclerosis. No enlarged abdominal or pelvic lymph nodes. Reproductive: Uterus and bilateral adnexa are unremarkable. Other: Peritoneal dialysis catheter is noted deep within the pelvis. Small amount of free fluid is noted within the pelvis associated with the catheter. Additionally there are a few foci of air identified in the upper abdomen adjacent to the spleen and stomach. These are likely related to the peritoneal dialysis catheter. By history the patient is receiving treatment for peritonitis. Musculoskeletal: No acute bony abnormality is seen. IMPRESSION: Minimal free air and free fluid within the abdomen likely related to the patient's known peritoneal dialysis catheter. No definitive evidence to suggest perforation are seen. Diverticulosis without diverticulitis. Fatty liver. Electronically Signed   By: MInez CatalinaM.D.   On: 04/09/2018 16:50   Ct Abdomen Pelvis W Contrast  Result Date: 03/27/2018 CLINICAL DATA:  Lower abdominal pain starting Friday. Patient does dialysis every day at home. Prior gastric sleeve, peritoneal catheter placement and cholecystectomy. EXAM: CT ABDOMEN AND PELVIS WITH CONTRAST TECHNIQUE: Multidetector CT imaging of the abdomen and pelvis was performed using the standard protocol following bolus administration  of intravenous contrast. CONTRAST:  117m OMNIPAQUE IOHEXOL 300 MG/ML  SOLN COMPARISON:  11/11/2017 CT FINDINGS: Lower chest: Top-normal heart size with coronary arteriosclerosis. No pericardial effusion or thickening. Clear lung bases with left basilar atelectasis. Hepatobiliary: Steatosis of the liver. More focal fatty infiltration adjacent to the falciform ligament. Surface nodularity of the liver  compatible morphologic changes of cirrhosis. No space-occupying mass of the liver is seen. The patient is status post cholecystectomy. Pancreas: No pancreatic mass or ductal dilatation.  No inflammation. Spleen: Normal Adrenals/Urinary Tract: Normal bilateral adrenal glands. Mild bilateral renal atrophy and cortical thinning. No obstructive uropathy, mass or nephrolithiasis. The urinary bladder is unremarkable for the degree of distention. It is slightly thick-walled but this is likely due to underdistention. Cystitis is believed less likely. Stomach/Bowel: Gastric sleeve procedure without complicating features. Small hiatal hernia is noted. Normal small bowel rotation without obstruction or inflammation. Sigmoid diverticulosis without acute diverticulitis. The appendix is normal caliber with probable small amount of retained contrast noted. Vascular/Lymphatic: Aortoiliac atherosclerosis. No adenopathy by CT size criteria. Reproductive: Uterus and bilateral adnexa are unremarkable. Other: Dialysis catheter is seen coiled within the mid pelvis. Musculoskeletal: No acute nor suspicious osseous abnormality. IMPRESSION: 1. Steatosis of the liver with slight morphologic changes of cirrhosis given surface nodularity of the liver. No space-occupying mass is seen. 2. Status post cholecystectomy. 3. Gastric sleeve procedure without complicating features. 4. Sigmoid diverticulosis without acute diverticulitis. 5. Percutaneous peritoneal dialysis catheter is seen coiled pelvis without complicating features. Electronically Signed   By: DAshley RoyaltyM.D.   On: 03/27/2018 20:55   Dg Chest Port 1 View  Result Date: 04/18/2018 CLINICAL DATA:  Fever EXAM: PORTABLE CHEST 1 VIEW COMPARISON:  03/16/2018 FINDINGS: The heart size and mediastinal contours are within normal limits. Both lungs are clear. The visualized skeletal structures are unremarkable. IMPRESSION: No active disease. Electronically Signed   By: HKathreen Devoid  On:  04/18/2018 11:31   [place]Abdomen Limited Ruq  Result Date: 04/19/2018 CLINICAL DATA:  Abnormal LFTs. EXAM: ULTRASOUND ABDOMEN LIMITED RIGHT UPPER QUADRANT COMPARISON:  CT abdomen pelvis from yesterday. FINDINGS: Gallbladder: Surgically absent. Common bile duct: Diameter: 4 mm, normal. Liver: No focal lesion identified. Subtle nodular contour. Coarsened, heterogeneous parenchymal echogenicity. Portal vein is patent on color Doppler imaging with normal direction of blood flow towards the liver. IMPRESSION: 1. Subtle liver surface nodularity with coarsened, heterogeneous parenchymal echogenicity, suspicious for cirrhosis. Electronically Signed   By: WTitus DubinM.D.   On: 04/19/2018 11:40      TODAY-DAY OF DISCHARGE:  Subjective:   YDezyre Hoefertoday has no headache,no chest abdominal pain,no new weakness tingling or numbness, feels much better wants to go home today.   Objective:   Blood pressure 116/73, pulse 67, temperature 98.3 F (36.8 C), temperature source Oral, resp. rate 16, height 5' 5"  (1.651 m), weight 109.5 kg, SpO2 100 %.  Intake/Output Summary (Last 24 hours) at 04/26/2018 1011 Last data filed at 04/25/2018 2139 Gross per 24 hour  Intake 0 ml  Output 535 ml  Net -535 ml   Filed Weights   04/25/18 0735 04/25/18 1150 04/26/18 0500  Weight: 112.2 kg 111.7 kg 109.5 kg    Exam: Awake Alert, Oriented *3, No new F.N deficits, Normal affect [place].AT,PERRAL Supple Neck,No JVD, No cervical lymphadenopathy appriciated.  Symmetrical Chest wall movement, Good air movement bilaterally, CTAB RRR,No Gallops,Rubs or new Murmurs, No Parasternal Heave +ve B.Sounds, Abd Soft, Non tender, No organomegaly appriciated, No rebound -guarding or rigidity.  No Cyanosis, Clubbing or edema, No new Rash or bruise   PERTINENT RADIOLOGIC STUDIES: Ct Abdomen Pelvis Wo Contrast  Result Date: 04/18/2018 CLINICAL DATA:  Fever a few days as recently completed treatment for staph peritonitis  with last dose of Ancef given 04/13/2018. Fever this morning with vomiting and abdominal pain over the weekend. Patient with end-stage renal disease post dialysis this morning. EXAM: CT ABDOMEN AND PELVIS WITHOUT CONTRAST TECHNIQUE: Multidetector CT imaging of the abdomen and pelvis was performed following the standard protocol without IV contrast. COMPARISON:  04/09/2018, 03/27/2018 and 11/11/2017 as well as 02/05/2015 FINDINGS: Lower chest: Minimal linear atelectasis/scarring left base. Small sliding hiatal hernia. Hepatobiliary: Previous cholecystectomy. Liver and biliary tree are within normal. Pancreas: Normal. Spleen: Normal. Adrenals/Urinary Tract: Adrenal glands are normal. Kidneys are somewhat small without hydronephrosis or nephrolithiasis. Ureters and bladder are normal. Stomach/Bowel: Surgical sutures over the stomach compatible previous bypass procedure. Small bowel is normal. Appendix is normal. Mild diverticulosis over the descending colon and sigmoid colon. Vascular/Lymphatic: Minimal calcified plaque over the abdominal aorta. No evidence of adenopathy. Reproductive: Normal. Other: Peritoneal dialysis catheter enters the lower abdomen just right of midline. Tip over the midline pelvis. Mild amount of free peritoneal fluid likely related to patient's peritoneal dialysis. Tiny amount of free peritoneal air also likely related to patient's peritoneal dialysis as this is unchanged. No focal inflammatory process. Musculoskeletal: Unchanged. IMPRESSION: Mild amount of free peritoneal fluid and tiny amount of free peritoneal air likely related patient's peritoneal dialysis. Dialysis catheter over the midline pelvis without significant change. Diverticulosis of the colon without active inflammation. Postsurgical changes as described. Electronically Signed   By: Marin Olp M.D.   On: 04/18/2018 15:50   X-ray Chest Pa And Lateral  Result Date: 04/19/2018 CLINICAL DATA:  Shortness of breath, nausea EXAM:  CHEST - 2 VIEW COMPARISON:  04/18/2018 FINDINGS: Linear left base atelectasis or scarring. Heart is upper limits normal in size. Right lung clear. No effusions or acute bony abnormality. IMPRESSION: Left basilar atelectasis or scarring. Electronically Signed   By: Rolm Baptise M.D.   On: 04/19/2018 07:47   Mr Brain Wo Contrast  Result Date: 04/21/2018 CLINICAL DATA:  Initial evaluation for acute altered mental status, encephalopathy. EXAM: MRI HEAD WITHOUT CONTRAST TECHNIQUE: Multiplanar, multiecho pulse sequences of the brain and surrounding structures were obtained without intravenous contrast. COMPARISON:  Prior MRI from 08/14/2016. FINDINGS: Brain: Examination technically limited as the patient was unable to tolerate the full length of the exam. DWI sequences with axial FLAIR sequence only was performed. Additionally, images provided are somewhat degraded by motion artifact. Cerebral volume within normal limits for age. Few scattered patchy subcentimeter FLAIR hyperintensities noted within the supratentorial cerebral white matter, nonspecific, but fairly mild for age and of doubtful significance in the acute setting. No mass lesion, midline shift, or mass effect. No hydrocephalus. No extra-axial fluid collection. No appreciable areas of chronic infarction. No imaging findings to suggest acute intracranial hemorrhage on this limited exam. No evidence for acute or subacute ischemia on DWI sequences. Vascular: Not well assessed on this limited exam. Skull and upper cervical spine: Calvarium intact. No focal marrow replacing lesion. No scalp soft tissue abnormality. Sinuses/Orbits: Globes orbital soft tissues grossly unremarkable. Paranasal sinuses are largely clear. No appreciable mastoid effusion. Other: None. IMPRESSION: 1. Technically limited exam due to patient's inability to tolerate the full length of the study. 2. Grossly negative MRI appearance of the brain. No evidence for acute intracranial infarct or  other definite abnormality.  Electronically Signed   By: Jeannine Boga M.D.   On: 04/21/2018 13:35   Ct Abdomen Pelvis W Contrast  Result Date: 04/09/2018 CLINICAL DATA:  Nausea and vomiting and abdominal pain EXAM: CT ABDOMEN AND PELVIS WITH CONTRAST TECHNIQUE: Multidetector CT imaging of the abdomen and pelvis was performed using the standard protocol following bolus administration of intravenous contrast. CONTRAST:  149m OMNIPAQUE IOHEXOL 300 MG/ML  SOLN COMPARISON:  03/27/2018 FINDINGS: Lower chest: No acute abnormality. Hepatobiliary: Diffuse fatty infiltration of the liver is noted. No focal mass is seen. The gallbladder has been surgically removed. Pancreas: Unremarkable. No pancreatic ductal dilatation or surrounding inflammatory changes. Spleen: Normal in size without focal abnormality. Adrenals/Urinary Tract: Adrenal glands are within normal limits. Renal vascular calcifications are noted. No obstructive changes are seen. The ureters are within normal limits bilaterally. The bladder is decompressed. Stomach/Bowel: Scattered diverticular changes noted within the colon. No evidence of diverticulitis is seen. The appendix is within normal limits. No obstructive or inflammatory changes of the small bowel are noted. Changes of prior sleeve gastrectomy are seen. Small sliding-type hiatal hernia is noted. Vascular/Lymphatic: Aortic atherosclerosis. No enlarged abdominal or pelvic lymph nodes. Reproductive: Uterus and bilateral adnexa are unremarkable. Other: Peritoneal dialysis catheter is noted deep within the pelvis. Small amount of free fluid is noted within the pelvis associated with the catheter. Additionally there are a few foci of air identified in the upper abdomen adjacent to the spleen and stomach. These are likely related to the peritoneal dialysis catheter. By history the patient is receiving treatment for peritonitis. Musculoskeletal: No acute bony abnormality is seen. IMPRESSION: Minimal  free air and free fluid within the abdomen likely related to the patient's known peritoneal dialysis catheter. No definitive evidence to suggest perforation are seen. Diverticulosis without diverticulitis. Fatty liver. Electronically Signed   By: MInez CatalinaM.D.   On: 04/09/2018 16:50   Ct Abdomen Pelvis W Contrast  Result Date: 03/27/2018 CLINICAL DATA:  Lower abdominal pain starting Friday. Patient does dialysis every day at home. Prior gastric sleeve, peritoneal catheter placement and cholecystectomy. EXAM: CT ABDOMEN AND PELVIS WITH CONTRAST TECHNIQUE: Multidetector CT imaging of the abdomen and pelvis was performed using the standard protocol following bolus administration of intravenous contrast. CONTRAST:  106mOMNIPAQUE IOHEXOL 300 MG/ML  SOLN COMPARISON:  11/11/2017 CT FINDINGS: Lower chest: Top-normal heart size with coronary arteriosclerosis. No pericardial effusion or thickening. Clear lung bases with left basilar atelectasis. Hepatobiliary: Steatosis of the liver. More focal fatty infiltration adjacent to the falciform ligament. Surface nodularity of the liver compatible morphologic changes of cirrhosis. No space-occupying mass of the liver is seen. The patient is status post cholecystectomy. Pancreas: No pancreatic mass or ductal dilatation.  No inflammation. Spleen: Normal Adrenals/Urinary Tract: Normal bilateral adrenal glands. Mild bilateral renal atrophy and cortical thinning. No obstructive uropathy, mass or nephrolithiasis. The urinary bladder is unremarkable for the degree of distention. It is slightly thick-walled but this is likely due to underdistention. Cystitis is believed less likely. Stomach/Bowel: Gastric sleeve procedure without complicating features. Small hiatal hernia is noted. Normal small bowel rotation without obstruction or inflammation. Sigmoid diverticulosis without acute diverticulitis. The appendix is normal caliber with probable small amount of retained contrast noted.  Vascular/Lymphatic: Aortoiliac atherosclerosis. No adenopathy by CT size criteria. Reproductive: Uterus and bilateral adnexa are unremarkable. Other: Dialysis catheter is seen coiled within the mid pelvis. Musculoskeletal: No acute nor suspicious osseous abnormality. IMPRESSION: 1. Steatosis of the liver with slight morphologic changes of cirrhosis given surface nodularity  of the liver. No space-occupying mass is seen. 2. Status post cholecystectomy. 3. Gastric sleeve procedure without complicating features. 4. Sigmoid diverticulosis without acute diverticulitis. 5. Percutaneous peritoneal dialysis catheter is seen coiled pelvis without complicating features. Electronically Signed   By: Ashley Royalty M.D.   On: 03/27/2018 20:55   Dg Chest Port 1 View  Result Date: 04/18/2018 CLINICAL DATA:  Fever EXAM: PORTABLE CHEST 1 VIEW COMPARISON:  03/16/2018 FINDINGS: The heart size and mediastinal contours are within normal limits. Both lungs are clear. The visualized skeletal structures are unremarkable. IMPRESSION: No active disease. Electronically Signed   By: Kathreen Devoid   On: 04/18/2018 11:31   US Abdomen Limited Ruq  Result Date: 04/19/2018 CLINICAL DATA:  Abnormal LFTs. EXAM: ULTRASOUND ABDOMEN LIMITED RIGHT UPPER QUADRANT COMPARISON:  CT abdomen pelvis from yesterday. FINDINGS: Gallbladder: Surgically absent. Common bile duct: Diameter: 4 mm, normal. Liver: No focal lesion identified. Subtle nodular contour. Coarsened, heterogeneous parenchymal echogenicity. Portal vein is patent on color Doppler imaging with normal direction of blood flow towards the liver. IMPRESSION: 1. Subtle liver surface nodularity with coarsened, heterogeneous parenchymal echogenicity, suspicious for cirrhosis. Electronically Signed   By: Titus Dubin M.D.   On: 04/19/2018 11:40     PERTINENT LAB RESULTS: CBC: Recent Labs    04/24/18 1037 04/25/18 0638  WBC 9.5 10.3  HGB 10.1* 9.9*  HCT 32.3* 30.4*  PLT 281 265    CMET CMP     Component Value Date/Time   NA 136 04/26/2018 0236   NA 140 09/24/2016 0936   K 3.6 04/26/2018 0236   CL 98 04/26/2018 0236   CO2 23 04/26/2018 0236   GLUCOSE 78 04/26/2018 0236   BUN 11 04/26/2018 0236   BUN 26 (H) 09/24/2016 0936   CREATININE 4.32 (H) 04/26/2018 0236   CREATININE 2.28 (H) 03/21/2012 0952   CALCIUM 8.2 (L) 04/26/2018 0236   PROT 5.5 (L) 04/25/2018 0451   PROT 7.2 09/24/2016 0936   ALBUMIN 2.4 (L) 04/26/2018 0236   ALBUMIN 4.5 09/24/2016 0936   AST 100 (H) 04/25/2018 0451   ALT 15 04/25/2018 0451   ALKPHOS 124 04/25/2018 0451   BILITOT 1.1 04/25/2018 0451   BILITOT 0.3 09/24/2016 0936   GFRNONAA 11 (L) 04/26/2018 0236   GFRAA 13 (L) 04/26/2018 0236    GFR Estimated Creatinine Clearance: 18.5 mL/min (A) (by C-G formula based on SCr of 4.32 mg/dL (H)). No results for input(s): LIPASE, AMYLASE in the last 72 hours. No results for input(s): CKTOTAL, CKMB, CKMBINDEX, TROPONINI in the last 72 hours. Invalid input(s): POCBNP No results for input(s): DDIMER in the last 72 hours. No results for input(s): HGBA1C in the last 72 hours. No results for input(s): CHOL, HDL, LDLCALC, TRIG, CHOLHDL, LDLDIRECT in the last 72 hours. No results for input(s): TSH, T4TOTAL, T3FREE, THYROIDAB in the last 72 hours.  Invalid input(s): FREET3 No results for input(s): VITAMINB12, FOLATE, FERRITIN, TIBC, IRON, RETICCTPCT in the last 72 hours. Coags: No results for input(s): INR in the last 72 hours.  Invalid input(s): PT Microbiology: Recent Results (from the past 440 hour(s))  Helicobacter pylori special antigen     Status: None   Collection Time: 04/18/18  8:06 AM  Result Value Ref Range Status   MICRO NUMBER: [id number]  Final   SPECIMEN QUALITY Adequate  Final   SOURCE: STOOL  Final   STATUS: FINAL  Final   RESULT:   Final    Not Detected  Antimicrobials, proton pump inhibitors, and  bismuth preparations inhibit H. pylori and ingestion up to two weeks  prior to testing may cause false negative results. If clinically indicated the test should be repeated on a new specimen  obtained two weeks after discontinuing treatment.   Blood Culture (routine x 2)     Status: None   Collection Time: 04/18/18 11:32 AM  Result Value Ref Range Status   Specimen Description BLOOD RIGHT FOREARM  Final   Special Requests   Final    BOTTLES DRAWN AEROBIC AND ANAEROBIC Blood Culture adequate volume   Culture   Final    NO GROWTH 5 DAYS Performed at [facility], [address], [place], [place] [address]    Report Status 04/23/2018 FINAL  Final  Blood Culture (routine x 2)     Status: None   Collection Time: 04/18/18 11:40 AM  Result Value Ref Range Status   Specimen Description BLOOD RIGHT ANTECUBITAL  Final   Special Requests   Final    BOTTLES DRAWN AEROBIC AND ANAEROBIC Blood Culture adequate volume   Culture   Final    NO GROWTH 5 DAYS Performed at [facility], [place] [address], [place], [place] [address]    Report Status 04/23/2018 FINAL  Final  Culture, body fluid-bottle     Status: None   Collection Time: 04/18/18  1:47 PM  Result Value Ref Range Status   Specimen Description PERITONEAL CAVITY  Final   Special Requests NONE  Final   Culture   Final    NO GROWTH 5 DAYS Performed at [facility], [address], [place], [place] [address]    Report Status 04/23/2018 FINAL  Final  Gram stain     Status: None   Collection Time: 04/18/18  1:47 PM  Result Value Ref Range Status   Specimen Description PERITONEAL CAVITY  Final   Special Requests NONE  Final   Gram Stain   Final    WBC PRESENT,BOTH PMN AND MONONUCLEAR NO ORGANISMS SEEN CYTOSPIN SMEAR Performed at [facility], [address], [place], [place] [address]    Report Status 04/18/2018 FINAL  Final  MRSA PCR Screening     Status: None   Collection Time: 04/18/18  2:37 PM  Result Value Ref Range Status   MRSA by PCR NEGATIVE NEGATIVE Final    Comment:         The GeneXpert MRSA Assay (FDA approved for NASAL specimens only), is one component of a comprehensive MRSA colonization surveillance program. It is not intended to diagnose MRSA infection nor to guide or monitor treatment for MRSA infections. Performed at [facility], [place] [address], [place], [place] [address]   Respiratory Panel by PCR     Status: None   Collection Time: 04/19/18 11:05 AM  Result Value Ref Range Status   Adenovirus NOT DETECTED NOT DETECTED Final   Coronavirus 229E NOT DETECTED NOT DETECTED Final    Comment: (NOTE) The Coronavirus on the Respiratory Panel, DOES NOT test for the novel  Coronavirus (2019 nCoV)    Coronavirus HKU1 NOT DETECTED NOT DETECTED Final   Coronavirus NL63 NOT DETECTED NOT DETECTED Final   Coronavirus OC43 NOT DETECTED NOT DETECTED Final   Metapneumovirus NOT DETECTED NOT DETECTED Final   Rhinovirus / Enterovirus NOT DETECTED NOT DETECTED Final   Influenza A NOT DETECTED NOT DETECTED Final   Influenza B NOT DETECTED NOT DETECTED Final   Parainfluenza Virus 1 NOT DETECTED NOT DETECTED  Final   Parainfluenza Virus 2 NOT DETECTED NOT DETECTED Final   Parainfluenza Virus 3 NOT DETECTED NOT DETECTED Final   Parainfluenza Virus 4 NOT DETECTED NOT DETECTED Final   Respiratory Syncytial Virus NOT DETECTED NOT DETECTED Final   Bordetella pertussis NOT DETECTED NOT DETECTED Final   Chlamydophila pneumoniae NOT DETECTED NOT DETECTED Final   Mycoplasma pneumoniae NOT DETECTED NOT DETECTED Final    Comment: Performed at [facility], [place] [address] [place], [place] [address]    FURTHER DISCHARGE INSTRUCTIONS:  Get Medicines reviewed and adjusted: Please take all your medications with you for your next visit with your Primary MD  Laboratory/radiological data: Please request your Primary MD to go over all hospital tests and procedure/radiological results at the follow up, please ask your Primary MD to get all Hospital records sent  to his/her office.  In some cases, they will be blood work, cultures and biopsy results pending at the time of your discharge. Please request that your primary care M.D. goes through all the records of your hospital data and follows up on these results.  Also Note the following: If you experience worsening of your admission symptoms, develop shortness of breath, life threatening emergency, suicidal or homicidal thoughts you must seek medical attention immediately by calling 911 or calling your MD immediately  if symptoms less severe.  You must read complete instructions/literature along with all the possible adverse reactions/side effects for all the Medicines you take and that have been prescribed to you. Take any new Medicines after you have completely understood and accpet all the possible adverse reactions/side effects.   Do not drive when taking Pain medications or sleeping medications (Benzodaizepines)  Do not take more than prescribed Pain, Sleep and Anxiety Medications. It is not advisable to combine anxiety,sleep and pain medications without talking with your primary care practitioner  Special Instructions: If you have smoked or chewed Tobacco  in the last 2 yrs please stop smoking, stop any regular Alcohol  and or any Recreational drug use.  Wear Seat belts while driving.  Please note: You were cared for by a hospitalist during your hospital stay. Once you are discharged, your primary care physician will handle any further medical issues. Please note that NO REFILLS for any discharge medications will be authorized once you are discharged, as it is imperative that you return to your primary care physician (or establish a relationship with a primary care physician if you do not have one) for your post hospital discharge needs so that they can reassess your need for medications and monitor your lab values.  Total Time spent coordinating discharge including counseling, education and face to  face time equals 35 minutes.  SignedOren Binet 04/26/2018 10:11 AM

## 2018-04-26 NOTE — Progress Notes (Signed)
Brenda Davis to be D/C'd home per MD order.  Discussed with the patient and all questions fully answered.  VSS, Skin clean, dry and intact without evidence of skin break down, no evidence of skin tears noted. IV catheter discontinued intact. Site without signs and symptoms of complications. Dressing and pressure applied.  An After Visit Summary was printed and given to the patient. Patient received prescription.  D/c education completed with patient/family including follow up instructions, medication list, d/c activities limitations if indicated, with other d/c instructions as indicated by MD - patient able to verbalize understanding, all questions fully answered.   Patient instructed to return to ED, call 911, or call MD for any changes in condition.   Patient escorted via [place], and D/C home via private auto.    [name] [facility] 04/26/2018 11:32 AM

## 2018-04-26 NOTE — TOC Initial Note (Addendum)
Transition of Care [facility]) - Initial/Assessment Note    Patient Details  Name: Brenda Davis MRN: [id number] Date of Birth: [birth year]  Transition of Care [facility] CM/SW Contact:    Sharin Mons, RN Phone Number: 04/26/2018, 10:56 AM  Clinical Narrative:     Admitted with sepsis 2/2 suspected peritonitis.   Transition to home today with home health services. Pt OP HD seat secured at [facility], MWF @  7:40am.  BSC will be delivered to bedside prior to d/c. Pt states has transportation to home.    Expected Discharge Plan: [place] Barriers to Discharge: Barriers Resolved   Patient Goals and CMS Choice     Choice offered to / list presented to : Patient  Expected Discharge Plan and Services Expected Discharge Plan: [place]   Discharge Planning Services: CM Consult Post Acute Care Choice: [place] arrangements for the past 2 months: Single Family Home Expected Discharge Date: 04/26/18               DME Arranged: 3-N-1 DME Agency: [facility] HH Arranged: PT, OT, NA HH Agency: [facility]  Prior Living Arrangements/Services Living arrangements for the past 2 months: Single Family Home Lives with:: Spouse Patient language and need for interpreter reviewed:: Yes Do you feel safe going back to the place where you live?: Yes      Need for Family Participation in Patient Care: Yes (Comment) Care giver support system in place?: Yes (comment)   Criminal Activity/Legal Involvement Pertinent to Current Situation/Hospitalization: No - Comment as needed  Activities of Daily Living      Permission Sought/Granted Permission sought to share information with : Case Manager, Family Supports Permission granted to share information with : Yes, Verbal Permission Granted  Share Information with NAME: Kristine Royal (Spouse)           Emotional Assessment Appearance:: Appears stated  age Attitude/Demeanor/Rapport: Engaged Affect (typically observed): Accepting Orientation: : Oriented to Self, Oriented to  Time, Oriented to Place, Oriented to Situation Alcohol / Substance Use: Not Applicable Psych Involvement: No (comment)  Admission diagnosis:  Hypokalemia [E87.6] SOB (shortness of breath) [R06.02] Sepsis, due to unspecified organism, unspecified whether acute organ dysfunction present [facility]) [A41.9] Patient Active Problem List   Diagnosis Date Noted  . Abnormal LFTs   . Peritonitis ([place]) 04/18/2018  . Dyspnea on exertion 04/18/2018  . Dialysis-associated peritonitis ([place]) 04/12/2018  . Coronary artery disease due to lipid rich plaque 04/12/2018  . Early satiety 04/12/2018  . Elevated troponin 03/08/2018  . Folate deficiency 03/08/2018  . CAD in native artery   . Hepatic cirrhosis ([place]) 11/24/2017  . Omental infarction ([place]) 08/25/2017  . ESRD on peritoneal dialysis ([place]) 08/25/2017  . DM (diabetes mellitus), type 2 with renal complications ([place]) 71/24/5809  . Deficiency anemia 08/24/2017  . Elevated lipase 08/24/2017  . Chronic heart failure with preserved ejection fraction ([place]) 06/22/2017  . Hypertension 09/15/2016  . Sensorineural hearing loss (SNHL), bilateral 06/29/2016  . Allergic rhinitis 06/27/2016  . Severe persistent asthma with exacerbation 05/12/2016  . Anemia of chronic disease 05/06/2016  . Routine general medical examination at a health care facility 12/05/2015  . Cervical cancer screening 12/05/2015  . Visit for screening mammogram 01/23/2015  . Sinus pause 01/04/2015  . Gout due to renal impairment 09/13/2014  . Thiamine deficiency 12/02/2012  . Meralgia paraesthetica 11/28/2012  . S/P laparoscopic sleeve gastrectomy 11/28/2012  . B12 deficiency anemia 11/28/2012  .  Morbid obesity with BMI of 40.0-44.9, adult ([place]) 07/22/2012  . Dyslipidemia, goal LDL below 70 04/09/2008  . CKD (chronic kidney disease) stage V requiring chronic  dialysis (MWF) 02/08/2008   PCP:  [name], MD Pharmacy:   [id number] - [place], [place] [address] Phone: [phone] Fax: [phone]  [facility] - [place] - [address], [address] [address] Phone: [phone] Fax: [phone]     Social Determinants of Health (SDOH) Interventions    Readmission Risk Interventions No flowsheet data found.

## 2018-04-28 ENCOUNTER — Ambulatory Visit (INDEPENDENT_AMBULATORY_CARE_PROVIDER_SITE_OTHER): Payer: 59 | Admitting: Internal Medicine

## 2018-04-28 ENCOUNTER — Encounter: Payer: Self-pay | Admitting: Internal Medicine

## 2018-04-28 DIAGNOSIS — I953 Hypotension of hemodialysis: Secondary | ICD-10-CM | POA: Diagnosis not present

## 2018-04-28 DIAGNOSIS — N186 End stage renal disease: Secondary | ICD-10-CM | POA: Diagnosis not present

## 2018-04-28 DIAGNOSIS — I2510 Atherosclerotic heart disease of native coronary artery without angina pectoris: Secondary | ICD-10-CM

## 2018-04-28 DIAGNOSIS — I2583 Coronary atherosclerosis due to lipid rich plaque: Secondary | ICD-10-CM

## 2018-04-28 DIAGNOSIS — I959 Hypotension, unspecified: Secondary | ICD-10-CM | POA: Insufficient documentation

## 2018-04-28 DIAGNOSIS — I5032 Chronic diastolic (congestive) heart failure: Secondary | ICD-10-CM | POA: Diagnosis not present

## 2018-04-28 DIAGNOSIS — Z992 Dependence on renal dialysis: Secondary | ICD-10-CM

## 2018-04-28 MED ORDER — MIDODRINE HCL 2.5 MG PO TABS: 2.5000 mg | ORAL_TABLET | Freq: Three times a day (TID) | ORAL | 0 refills | Status: DC

## 2018-04-28 MED ORDER — ATORVASTATIN CALCIUM 80 MG PO TABS: 80.0000 mg | ORAL_TABLET | Freq: Every day | ORAL | 1 refills | Status: DC

## 2018-04-28 MED ORDER — CINACALCET HCL 30 MG PO TABS: 30.0000 mg | ORAL_TABLET | Freq: Every day | ORAL | 0 refills | Status: DC

## 2018-04-28 NOTE — Progress Notes (Signed)
Virtual Visit via Video Note  I connected with Brenda Davis on 04/28/18 at  3:30 PM EDT by a video enabled telemedicine application and verified that I am speaking with the correct person using two identifiers.   I discussed the limitations of evaluation and management by telemedicine and the availability of in person appointments. The patient expressed understanding and agreed to proceed.  History of Present Illness: She was not willing to come in for a visit due to the COVID-19 pandemic.  She was recently admitted for a febrile illness that was complicated by delirium.  No specific source for the fever was identified other than her stool was positive for C. difficile toxin.  She was treated with a course of metronidazole.  She tells me her recent blood pressures have been down to 96/50 and 64/50 with HD.  She is symptomatic with the hypotension with dizziness and lightheadedness.  She tells me that while she was admitted she was treated with midodrine and she said it helped quite a bit.  She still has a poor appetite but denies abdominal pain, nausea, vomiting, fever, chills, or diarrhea.    Observations/Objective: She looked well on the video.  She was alert, calm, appropriate, and in no acute distress.   Lab Results  Component Value Date   WBC 10.3 04/25/2018   HGB 9.9 (L) 04/25/2018   HCT 30.4 (L) 04/25/2018   PLT 265 04/25/2018   GLUCOSE 78 04/26/2018   CHOL 225 (H) 02/26/2018   TRIG 186 (H) 02/26/2018   HDL 38 (L) 02/26/2018   LDLDIRECT 161.6 11/28/2012   LDLCALC 150 (H) 02/26/2018   ALT 15 04/25/2018   AST 100 (H) 04/25/2018   NA 136 04/26/2018   K 3.6 04/26/2018   CL 98 04/26/2018   CREATININE 4.32 (H) 04/26/2018   BUN 11 04/26/2018   CO2 23 04/26/2018   TSH 3.798 04/18/2018   INR 1.3 (H) 04/21/2018   HGBA1C 6.6 (A) 03/07/2018   MICROALBUR renal 04/12/2018    Assessment and Plan: Her fever has resolved and she does not have diarrhea so it appears the C.  difficile infection has been adequately treated.  I am concerned about the symptomatic hypotension so I have prescribed midodrine.  Will start at a low dose and will increase the dose as tolerated and indicated.  She will let me know if she develops diarrhea, fever, or chills.   Follow Up Instructions: Her liquid intake is determined by nephrology and hemodialysis.  She will let me know if she starts to feel dehydrated.  She will let me know how well she responds to the current dose of midodrine and I will increase the dose if indicated.  She will let me know if she develops any new or worsening symptoms.    I discussed the assessment and treatment plan with the patient. The patient was provided an opportunity to ask questions and all were answered. The patient agreed with the plan and demonstrated an understanding of the instructions.   The patient was advised to call back or seek an in-person evaluation if the symptoms worsen or if the condition fails to improve as anticipated.  I provided 30 minutes of non-face-to-face time during this encounter.   Scarlette Calico, MD

## 2018-05-02 ENCOUNTER — Encounter: Payer: Self-pay | Admitting: Internal Medicine

## 2018-05-02 ENCOUNTER — Telehealth: Payer: Self-pay | Admitting: *Deleted

## 2018-05-02 NOTE — Telephone Encounter (Signed)
Copied from [place] [id number]. Topic: Quick Communication - Home Health Verbal Orders >> Apr 29, 2018  8:36 AM Brenda Davis wrote: Caller/Agency: Brenda Davis [phone] Requesting OT/PT/Skilled Nursing/Social Work/Speech Therapy: PT,Speech Therapy, and Social Worker Frequency: 6 weeks

## 2018-05-02 NOTE — Telephone Encounter (Signed)
Called Brenda Davis no answer LMOM w./MD response.Marland KitchenJohny Davis

## 2018-05-02 NOTE — Telephone Encounter (Signed)
Yes, ok with me 

## 2018-05-03 ENCOUNTER — Telehealth: Payer: Self-pay | Admitting: *Deleted

## 2018-05-03 ENCOUNTER — Telehealth: Payer: Self-pay | Admitting: Gastroenterology

## 2018-05-03 NOTE — Telephone Encounter (Signed)
This was treated in the hospital with metronidazole. When the results came back I sent a note to the doctor taking care of her in the hospital but I never heard back from him. If she still has diarrhea then we will need to check another stool specimen.

## 2018-05-03 NOTE — Telephone Encounter (Signed)
Please advise on 04/18/18 GI pathogen panel results. I'm not sure if Dr. Ardis Hughs saw your message.   Copied from [place] [id number]. Topic: General - Call Back - No Documentation >> May 03, 2018 10:27 AM Richardo Priest, NT wrote: Reason for CRM: Attempted to call Fc line 3x. No answer. Patient states she is calling back in regards to her positive C-diff results and would like to discuss with Dr.Jones. Patient's call back number is [phone].

## 2018-05-03 NOTE — Telephone Encounter (Signed)
The pt was advised that her H pylori was negative but it looks like Dr Ronnald Ramp did stool testing and she was positive for C diff but was not called.  The pt will call Dr Ronnald Ramp at this time to discuss.

## 2018-05-03 NOTE — Telephone Encounter (Signed)
Pt informed of below. She denies diarrhea at this time but is  c/o lower abd pain that started today. She just wanted to make sure there is nothing else she should do so she doesn't end up back in ED. Please advise.

## 2018-05-04 ENCOUNTER — Telehealth: Payer: Self-pay | Admitting: Emergency Medicine

## 2018-05-04 ENCOUNTER — Other Ambulatory Visit: Payer: Self-pay | Admitting: Internal Medicine

## 2018-05-04 DIAGNOSIS — N186 End stage renal disease: Secondary | ICD-10-CM

## 2018-05-04 DIAGNOSIS — K7581 Nonalcoholic steatohepatitis (NASH): Secondary | ICD-10-CM

## 2018-05-04 DIAGNOSIS — G9341 Metabolic encephalopathy: Secondary | ICD-10-CM

## 2018-05-04 DIAGNOSIS — Z9884 Bariatric surgery status: Secondary | ICD-10-CM

## 2018-05-04 DIAGNOSIS — I132 Hypertensive heart and chronic kidney disease with heart failure and with stage 5 chronic kidney disease, or end stage renal disease: Secondary | ICD-10-CM

## 2018-05-04 DIAGNOSIS — E1122 Type 2 diabetes mellitus with diabetic chronic kidney disease: Secondary | ICD-10-CM

## 2018-05-04 DIAGNOSIS — Z992 Dependence on renal dialysis: Secondary | ICD-10-CM

## 2018-05-04 DIAGNOSIS — I5032 Chronic diastolic (congestive) heart failure: Secondary | ICD-10-CM

## 2018-05-04 DIAGNOSIS — Z6841 Body Mass Index (BMI) 40.0 and over, adult: Secondary | ICD-10-CM

## 2018-05-04 DIAGNOSIS — J4550 Severe persistent asthma, uncomplicated: Secondary | ICD-10-CM

## 2018-05-04 DIAGNOSIS — I2583 Coronary atherosclerosis due to lipid rich plaque: Secondary | ICD-10-CM

## 2018-05-04 DIAGNOSIS — D631 Anemia in chronic kidney disease: Secondary | ICD-10-CM

## 2018-05-04 DIAGNOSIS — H903 Sensorineural hearing loss, bilateral: Secondary | ICD-10-CM

## 2018-05-04 DIAGNOSIS — K7460 Unspecified cirrhosis of liver: Secondary | ICD-10-CM

## 2018-05-04 DIAGNOSIS — A09 Infectious gastroenteritis and colitis, unspecified: Secondary | ICD-10-CM

## 2018-05-04 NOTE — Telephone Encounter (Signed)
Pt contacted office stating that she is still having diarrhea and stomach pain after being treated for c-diff in hospital. Pt will come to lab to give stool sample. Please put orders in and she will come tomorrow morning.

## 2018-05-05 ENCOUNTER — Other Ambulatory Visit: Payer: 59

## 2018-05-05 DIAGNOSIS — A09 Infectious gastroenteritis and colitis, unspecified: Secondary | ICD-10-CM

## 2018-05-06 ENCOUNTER — Other Ambulatory Visit: Payer: 59

## 2018-05-06 ENCOUNTER — Telehealth: Payer: Self-pay | Admitting: Internal Medicine

## 2018-05-06 ENCOUNTER — Telehealth: Payer: Self-pay

## 2018-05-06 DIAGNOSIS — A09 Infectious gastroenteritis and colitis, unspecified: Secondary | ICD-10-CM

## 2018-05-06 LAB — FECAL LACTOFERRIN, QUANT
Fecal Lactoferrin: NEGATIVE
MICRO NUMBER:: 400543
SPECIMEN QUALITY:: ADEQUATE

## 2018-05-06 LAB — C.DIFFICILE TOXIN: C. Difficile Toxin A: DETECTED — AB

## 2018-05-06 MED ORDER — VANCOMYCIN HCL 125 MG PO CAPS: 125.0000 mg | ORAL_CAPSULE | Freq: Four times a day (QID) | ORAL | 0 refills | Status: DC

## 2018-05-06 NOTE — Telephone Encounter (Signed)
fyi

## 2018-05-06 NOTE — Telephone Encounter (Signed)
Please let her know that tested positive for C. Diff; sending in oral Vancomycin 125 mg qid x 10 days; follow-up with Dr. Ronnald Ramp next week if further questions/ concerns.

## 2018-05-06 NOTE — Telephone Encounter (Signed)
Copied from [place] [id number]. Topic: Quick Communication - Home Health Verbal Orders >> May 06, 2018  9:33 AM Yvette Rack wrote: Caller/Agency: [facility] - Speech Therapist Callback Number: [phone] Pt has refused Speech Therapy for this week and requests it to be done next week

## 2018-05-06 NOTE — Telephone Encounter (Signed)
Received call from lab and patient tested positive for C-diff.  Please advise.  Thanks

## 2018-05-06 NOTE — Addendum Note (Signed)
Addended by: Sherlene Shams on: 05/06/2018 04:24 PM   Modules accepted: Orders

## 2018-05-08 ENCOUNTER — Other Ambulatory Visit: Payer: Self-pay | Admitting: Internal Medicine

## 2018-05-08 ENCOUNTER — Encounter: Payer: Self-pay | Admitting: Internal Medicine

## 2018-05-08 DIAGNOSIS — A0472 Enterocolitis due to Clostridium difficile, not specified as recurrent: Secondary | ICD-10-CM | POA: Insufficient documentation

## 2018-05-09 LAB — GIARDIA/CRYPTOSPORIDIUM (EIA)
MICRO NUMBER:: 403616
MICRO NUMBER:: 403617
RESULT:: NOT DETECTED
RESULT:: NOT DETECTED
SPECIMEN QUALITY:: ADEQUATE
SPECIMEN QUALITY:: ADEQUATE

## 2018-05-16 ENCOUNTER — Telehealth: Payer: Self-pay | Admitting: Internal Medicine

## 2018-05-16 NOTE — Telephone Encounter (Signed)
Copied from [place] [id number]. Topic: Quick Communication - Home Health Verbal Orders >> May 16, 2018  1:26 PM Yvette Rack wrote: Caller/Agency: Liji with [facility] Number: [phone] Requesting OT/PT/Skilled Nursing/Social Work/Speech Therapy: PT Frequency: pt had a missed visit last week and Liji would like approval to go out on the missed visit

## 2018-05-17 NOTE — Telephone Encounter (Signed)
Please advise 

## 2018-05-17 NOTE — Telephone Encounter (Signed)
Spoke with Liji, gave verbal orders per MD

## 2018-05-17 NOTE — Telephone Encounter (Signed)
Yes. I am okay with this  TJ 

## 2018-05-19 LAB — COMPREHENSIVE METABOLIC PANEL
ALT: 31 IU/L (ref 0–32)
AST: 43 IU/L — ABNORMAL HIGH (ref 0–40)
Albumin/Globulin Ratio: 1.5 (ref 1.2–2.2)
Albumin: 3.7 g/dL — ABNORMAL LOW (ref 3.8–4.9)
Alkaline Phosphatase: 104 IU/L (ref 39–117)
BUN/Creatinine Ratio: 3 — ABNORMAL LOW (ref 9–23)
BUN: 15 mg/dL (ref 6–24)
Bilirubin Total: 0.4 mg/dL (ref 0.0–1.2)
CO2: 25 mmol/L (ref 20–29)
Calcium: 9.6 mg/dL (ref 8.7–10.2)
Chloride: 98 mmol/L (ref 96–106)
Creatinine, Ser: 5.44 mg/dL — ABNORMAL HIGH (ref 0.57–1.00)
GFR calc Af Amer: 10 mL/min/{1.73_m2} — ABNORMAL LOW (ref >59)
GFR calc non Af Amer: 8 mL/min/{1.73_m2} — ABNORMAL LOW (ref >59)
Globulin, Total: 2.5 g/dL (ref 1.5–4.5)
Glucose: 97 mg/dL (ref 65–99)
Potassium: 4.3 mmol/L (ref 3.5–5.2)
Sodium: 142 mmol/L (ref 134–144)
Total Protein: 6.2 g/dL (ref 6.0–8.5)

## 2018-05-19 LAB — LIPID PANEL
Chol/HDL Ratio: 4.6 ratio — ABNORMAL HIGH (ref 0.0–4.4)
Cholesterol, Total: 218 mg/dL — ABNORMAL HIGH (ref 100–199)
HDL: 47 mg/dL (ref >39)
LDL Calculated: 147 mg/dL — ABNORMAL HIGH (ref 0–99)
Triglycerides: 122 mg/dL (ref 0–149)
VLDL Cholesterol Cal: 24 mg/dL (ref 5–40)

## 2018-05-20 ENCOUNTER — Telehealth: Payer: Self-pay | Admitting: Cardiology

## 2018-05-20 NOTE — Telephone Encounter (Signed)
Mychart, smartphone, pre reg complete 05/20/18 AF

## 2018-05-23 ENCOUNTER — Telehealth: Payer: Self-pay

## 2018-05-23 ENCOUNTER — Encounter: Payer: Self-pay | Admitting: Internal Medicine

## 2018-05-23 ENCOUNTER — Encounter: Payer: Self-pay | Admitting: Cardiology

## 2018-05-23 ENCOUNTER — Telehealth (INDEPENDENT_AMBULATORY_CARE_PROVIDER_SITE_OTHER): Payer: 59 | Admitting: Cardiology

## 2018-05-23 VITALS — BP 87/64 | HR 91 | Ht 65.0 in | Wt 242.0 lb

## 2018-05-23 DIAGNOSIS — N186 End stage renal disease: Secondary | ICD-10-CM

## 2018-05-23 DIAGNOSIS — Z992 Dependence on renal dialysis: Secondary | ICD-10-CM

## 2018-05-23 DIAGNOSIS — I2510 Atherosclerotic heart disease of native coronary artery without angina pectoris: Secondary | ICD-10-CM | POA: Diagnosis not present

## 2018-05-23 DIAGNOSIS — E785 Hyperlipidemia, unspecified: Secondary | ICD-10-CM

## 2018-05-23 DIAGNOSIS — Z6841 Body Mass Index (BMI) 40.0 and over, adult: Secondary | ICD-10-CM

## 2018-05-23 DIAGNOSIS — A419 Sepsis, unspecified organism: Secondary | ICD-10-CM

## 2018-05-23 DIAGNOSIS — I953 Hypotension of hemodialysis: Secondary | ICD-10-CM

## 2018-05-23 NOTE — Progress Notes (Signed)
Virtual Visit via Video Note   This visit type was conducted due to national recommendations for restrictions regarding the COVID-19 Pandemic (e.g. social distancing) in an effort to limit this patient's exposure and mitigate transmission in our community.  Due to her co-morbid illnesses, this patient is at least at moderate risk for complications without adequate follow up.  This format is felt to be most appropriate for this patient at this time.  All issues noted in this document were discussed and addressed.  A limited physical exam was performed with this format.  Please refer to the patient's chart for her consent to telehealth for [facility]   Date:  05/23/2018   ID:  Brenda Davis, DOB 1965-11-24, MRN [id number]  Patient Location: Home Provider Location: Home  PCP:  [name], MD  Cardiologist:  Dr Oval Linsey Electrophysiologist:  None   Evaluation Performed:  Follow-Up Visit  Chief Complaint:  none  History of Present Illness:    Brenda Davis is a 53 y.o. female female followed by Dr. Oval Linsey with a history of palpitations in the past.  She is a dialysis patient.  She apparently had issues with hypotension and palpitations while on hemodialysis in the past.  She was transitioned to peritoneal dialysis and tolerated this better.  She was hopsitalized for chest pain February 25, 2018.  Her troponin went to 0.15.  Diagnostic catheterization revealed moderate three-vessel disease with a 45% LAD, 55% CFX, and 65% RCA.  Echocardiogram showed normal to hyperdynamic LV function.  The patient was placed on statin therapy, aspirin 81 mg and low-dose beta-blocker.   She was seen in the office 03/08/2018 and was doing well.  She was admitted again 3/30-04/26/2018 with sepsis and uremia with MS changes.  She did recover and is now back on hemodialysis.  She has had some issues with low B/P and Midodrine was added by her PCP.  Today she tells me she is doing well,  no unusual chest pain.  She did come off her Lipitor (not clear why, she denies any side effects) and a recent lipid panel showed her LDL to be 147, LFTs near normal (AST 43).  She has contacted her pharmacy and is going to resume this.  The patient does not have symptoms concerning for COVID-19 infection (fever, chills, cough, or new shortness of breath).    Past Medical History:  Diagnosis Date  . Anemia   . Chronic kidney disease    STAGE 3 CHRONIC KIDNEY DISEASE SECONDARY TO DIABETIC GLOMERULOSCLEROSIS AND UNCONTROLLED HYPERTENSION - PER OFFICE NOTES DR. [facility].  Marland Kitchen ESRD on dialysis [facility])    "MWF; [address] (05/06/2016)  . Gout   . HCAP (healthcare-associated pneumonia) 05/06/2016  . History of blood transfusion    "related to surgery"  . Hyperlipidemia   . Hypertension   . Morbid obesity ([place])   . Pain    LEFT SHOULDER PAIN - WAS SEEN AT AN URGENT CARE - GIVEN SLING FOR COMFORT AND TOLD ROM AS TOLERATED.  Marland Kitchen Palpitations 09/24/2016  . Type II diabetes mellitus ([place])    "gastric sleeve OR corrected this" (05/06/2016)   Past Surgical History:  Procedure Laterality Date  . AV FISTULA PLACEMENT Left 01/03/2015   Procedure: BRACHIAL CEPHALIC ARTERIOVENOUS  FISTULA CREATION LEFT ARM;  Surgeon: Angelia Mould, MD;  Location: [place];  Service: Vascular;  Laterality: Left;  . CARPAL TUNNEL RELEASE Right   . [place]  .  CHOLECYSTECTOMY  09/14/2017  . ESOPHAGOGASTRODUODENOSCOPY N/A 09/04/2012   Procedure: ESOPHAGOGASTRODUODENOSCOPY (EGD);  Surgeon: Shann Medal, MD;  Location: [facility];  Service: General;  Laterality: N/A;  PF  . ESOPHAGOGASTRODUODENOSCOPY (EGD) WITH ESOPHAGEAL DILATION N/A 09/29/2012   Procedure: ESOPHAGOGASTRODUODENOSCOPY (EGD) WITH ESOPHAGEAL DILATION;  Surgeon: Milus Banister, MD;  [facility];  Service: Endoscopy;  Laterality: N/A;  . INSERTION OF DIALYSIS CATHETER N/A 01/03/2015   Procedure:  INSERTION OF DIALYSIS CATHETER RIGHT INTERNAL JUGULAR;  Surgeon: Angelia Mould, MD;  Location: [place];  Service: Vascular;  Laterality: N/A;  . LAPAROSCOPIC GASTRIC SLEEVE RESECTION N/A 07/19/2012   Procedure: LAPAROSCOPIC SLEEVE GASTRECTOMY with EGD;  Surgeon: Madilyn Hook, DO;  [facility];  Service: General;  Laterality: N/A;  laparoscopic sleeve gastrectomy with EGD  . LEFT HEART CATH AND CORONARY ANGIOGRAPHY N/A 02/25/2018   Procedure: LEFT HEART CATH AND CORONARY ANGIOGRAPHY;  Surgeon: Leonie Man, MD;  Location: [place] [facility];  Service: Cardiovascular;  Laterality: N/A;  . PERIPHERAL VASCULAR CATHETERIZATION Left 05/23/2015   Procedure: Nolon Stalls;  Surgeon: Conrad Akron, MD;  Location: [place] CV LAB;  Service: Cardiovascular;  Laterality: Left;  upper aRM  . PERIPHERAL VASCULAR CATHETERIZATION Left 05/23/2015   Procedure: Peripheral Vascular Balloon Angioplasty;  Surgeon: Conrad Garrett, MD;  Location: [place] CV LAB;  Service: Cardiovascular;  Laterality: Left;  av fistula  . TUBAL LIGATION  1993  . UPPER GI ENDOSCOPY N/A 07/19/2012   Procedure: UPPER GI ENDOSCOPY;  Surgeon: Madilyn Hook, DO;  [facility];  Service: General;  Laterality: N/A;     Current Meds  Medication Sig  . acetaminophen (TYLENOL) 325 MG tablet Take 650 mg by mouth every 6 (six) hours as needed for mild pain.  Marland Kitchen aspirin EC 81 MG EC tablet Take 1 tablet (81 mg total) by mouth daily.  Marland Kitchen atorvastatin (LIPITOR) 80 MG tablet Take 1 tablet (80 mg total) by mouth daily at 6 PM.  . AURYXIA 1 GM 210 MG(Fe) tablet Take 210 mg by mouth 3 (three) times daily with meals.   . cinacalcet (SENSIPAR) 30 MG tablet Take 1 tablet (30 mg total) by mouth daily with supper.  . Cyanocobalamin (B-12 PO) Take 1 tablet by mouth daily.  . folic acid (FOLVITE) 1 MG tablet Take 1 tablet (1 mg total) by mouth daily.  Marland Kitchen gentamicin cream (GARAMYCIN) 0.1 % Apply 1 application topically See admin instructions. Apply to  exit site daily as directed.  . midodrine (PROAMATINE) 2.5 MG tablet Take 1 tablet (2.5 mg total) by mouth 3 (three) times daily with meals.  . ondansetron (ZOFRAN ODT) 8 MG disintegrating tablet Take 1 tablet (8 mg total) by mouth every 8 (eight) hours as needed for nausea or vomiting.     Allergies:   Amlodipine; Clonidine derivatives; Doxycycline; and Welchol [colesevelam hcl]   Social History   Tobacco Use  . Smoking status: Former Smoker    Packs/day: 1.00    Years: 16.00    Pack years: 16.00    Types: Cigarettes    Last attempt to quit: 2009    Years since quitting: 11.3  . Smokeless tobacco: Never Used  Substance Use Topics  . Alcohol use: Never    Frequency: Never  . Drug use: No     Family Hx: The patient's family history includes Arthritis in an other family member; Diabetes in her father; Hyperlipidemia in an other family member; Hypertension in her mother; Mitral valve prolapse  in her mother. There is no history of Cancer, Heart disease, Kidney disease, or Stroke.  ROS:   Please see the history of present illness.    All other systems reviewed and are negative.   Prior CV studies:   The following studies were reviewed today: Cath 02/25/2018  Labs/Other Tests and Data Reviewed:    EKG:  No ECG reviewed.  Recent Labs: 02/24/2018: B Natriuretic Peptide 36.5 04/18/2018: TSH 3.798 04/23/2018: Magnesium 1.9 04/25/2018: Hemoglobin 9.9; Platelets 265 05/19/2018: ALT 31; BUN 15; Creatinine, Ser 5.44; Potassium 4.3; Sodium 142   Recent Lipid Panel Lab Results  Component Value Date/Time   CHOL 218 (H) 05/19/2018 09:53 AM   TRIG 122 05/19/2018 09:53 AM   HDL 47 05/19/2018 09:53 AM   CHOLHDL 4.6 (H) 05/19/2018 09:53 AM   CHOLHDL 5.9 02/26/2018 05:09 AM   LDLCALC 147 (H) 05/19/2018 09:53 AM   LDLDIRECT 161.6 11/28/2012 10:42 AM    Wt Readings from Last 3 Encounters:  05/23/18 242 lb (109.8 kg)  04/26/18 241 lb 6.5 oz (109.5 kg)  04/12/18 253 lb (114.8 kg)      Objective:    Vital Signs:  BP (!) 87/64   Pulse 91   Ht 5\' 5"  (1.651 m)   Wt 242 lb (109.8 kg)   BMI 40.27 kg/m    VITAL SIGNS:  reviewed  ASSESSMENT & PLAN:    CAD in native artery Moderate 3V CAD at cath 02/25/2018 45% LAD 55% CFX 65% RCA Normal (hyperdynamic) LVF  Elevated troponin [place] ischemia- Feb 2020  ESRD on peritoneal dialysis [facility]) Followed by  Dr Marval Regal  DM (diabetes mellitus), type 2 with renal complications (HCC) IDDM  Dyslipidemia, goal LDL below 70 High dose statin Rx added  Morbid obesity with BMI of 40.0-44.9, adult ([place]) BMI 43-   COVID-19 Education: The signs and symptoms of COVID-19 were discussed with the patient and how to seek care for testing (follow up with PCP or arrange E-visit).  The importance of social distancing was discussed today.  Time:   Today, I have spent 15 minutes with the patient with telehealth technology discussing the above problems.     Medication Adjustments/Labs and Tests Ordered: Current medicines are reviewed at length with the patient today.  Concerns regarding medicines are outlined above.   Tests Ordered: No orders of the defined types were placed in this encounter.   Medication Changes: No orders of the defined types were placed in this encounter.   Disposition:  Follow up Resume lipitor, f/u OV in Oct with dr Oval Linsey- check lipids and LFTs then  Signed, Kerin Ransom, PA-C  05/23/2018 3:41 PM    [place]

## 2018-05-23 NOTE — Telephone Encounter (Signed)
Contacted patient to discuss AVS instructions. Patient voiced understanding. 

## 2018-05-23 NOTE — Patient Instructions (Signed)
Medication Instructions:  Your physician recommends that you continue on your current medications as directed. Please refer to the Current Medication list given to you today. If you need a refill on your cardiac medications before your next appointment, please call your pharmacy.   Lab work: None  If you have labs (blood work) drawn today and your tests are completely normal, you will receive your results only by: . MyChart Message (if you have MyChart) OR . A paper copy in the mail If you have any lab test that is abnormal or we need to change your treatment, we will call you to review the results.  Testing/Procedures: None   Follow-Up: At [facility], you and your health needs are our priority.  As part of our continuing mission to provide you with exceptional heart care, we have created designated Provider Care Teams.  These Care Teams include your primary Cardiologist (physician) and Advanced Practice Providers (APPs -  Physician Assistants and Nurse Practitioners) who all work together to provide you with the care you need, when you need it. You will need a follow up appointment in 6 months.  Please call our office 2 months in advance to schedule this appointment.  You may see Tiffany Marks, MD or one of the following Advanced Practice Providers on your designated Care Team:   Luke Kilroy, PA-C Krista Kroeger, PA-C . Callie Goodrich, PA-C  Any Other Special Instructions Will Be Listed Below (If Applicable).    

## 2018-05-24 ENCOUNTER — Encounter: Payer: Self-pay | Admitting: General Surgery

## 2018-05-25 ENCOUNTER — Ambulatory Visit (INDEPENDENT_AMBULATORY_CARE_PROVIDER_SITE_OTHER): Payer: 59 | Admitting: Gastroenterology

## 2018-05-25 ENCOUNTER — Telehealth: Payer: Self-pay | Admitting: Internal Medicine

## 2018-05-25 ENCOUNTER — Other Ambulatory Visit: Payer: Self-pay

## 2018-05-25 ENCOUNTER — Encounter: Payer: Self-pay | Admitting: Gastroenterology

## 2018-05-25 ENCOUNTER — Telehealth: Payer: Self-pay | Admitting: Cardiology

## 2018-05-25 VITALS — Ht 65.0 in | Wt 248.0 lb

## 2018-05-25 DIAGNOSIS — I2510 Atherosclerotic heart disease of native coronary artery without angina pectoris: Secondary | ICD-10-CM

## 2018-05-25 DIAGNOSIS — K7460 Unspecified cirrhosis of liver: Secondary | ICD-10-CM | POA: Diagnosis not present

## 2018-05-25 DIAGNOSIS — I2583 Coronary atherosclerosis due to lipid rich plaque: Principal | ICD-10-CM

## 2018-05-25 MED ORDER — ATORVASTATIN CALCIUM 80 MG PO TABS: 80.0000 mg | ORAL_TABLET | Freq: Every day | ORAL | 1 refills | Status: DC

## 2018-05-25 NOTE — Telephone Encounter (Signed)
Contacted Liji with [facility] and gave verbal okay to extend PT to two extra days as requested.

## 2018-05-25 NOTE — Telephone Encounter (Signed)
Patient misplaced medication bottle of atorvastatin and was unable to locate it. New refill sent to pharmacy per patient request.

## 2018-05-25 NOTE — Progress Notes (Signed)
Review of pertinent gastrointestinal problems: 1. Possbile/probable cirrhosis, from NASH?  initially discovered 2019 with abnormal appearing liver on imaging (CT scan 10/2017 "liver contour is slightly irregular...suggests cirrhosis." CT 03/2018 "slight morphologic changes of cirrhosis given surface nodularity of the liver." and [place] 03/2018 "subtle liver surface nodularity."  Lab workup 2019: iron studies, A1A, AMA, ANA, ASMA, ceruloplasm were all normal or negative. HepC Ab negative, Hep B surface antigen negative, Hep B Surface Ab +.   INR, platelets, T bili normal, no encephalpathy, no ascites or edema.  AFP 1.9 (11/2017)  2. Bariatric gastric surgery remote 3. cholelithiasis by [place] 2019 4. Chronic renal failure, peritoneal dialysis since 2018, episode of staph peritonitis 2019 5. C. Difficile infection 2020, responded well to oral vanc   This service was provided via virtual visit.  I attempted both audio and video however the video part malfunctioned and so only audio was used.  The patient was located at home.  I was located in my office.  The patient did consent to this virtual visit and is aware of possible charges through their insurance for this visit.  The patient is an established patient.  My certified medical assistant, Grace Bushy, contributed to this visit by contacting the patient by phone 1 or 2 business days prior to the appointment and also followed up on the recommendations I made after the visit.  Time spent on virtual visit: 73mn   HPI: This is a very pleasant 53year old woman whom [facility]  I had a telemedicine visit with her about 6 weeks ago, early on the pandemic restrictions.  We discussed nausea early satiety and weight loss.  The symptoms all started 3 weeks prior when she was diagnosed with Staphylococcus in her peritoneum related to peritoneal dialysis.  I recommended waiting a few days longer to see if her symptoms  improved since she had fairly recently just completed antibiotics.  I thought it was reasonable to check H. pylori stool testing and start a proton pump inhibitor.  The H. pylori stool testing was negative.  Her PCP around the same time ordered GI pathogen panel and it was positive for C. difficile toxin.  She was started on vancomycin for it.  She presented very shortly after that visit to the hospital with significant mental status changes and was found to be encephalopathic, uremic.  Her mental status cleared after hemodialysis was started to augment her peritoneal dialysis.  Around that time her liver tests were significantly elevated AST and ALT were 219 and 8 respectively, alk phos around 150.  She had her last hemodialysis today.  She had undergone peritoneal dialysis for 1.5 years without any problems.  She also had c. Difficile (ordered by PCP), was put on vancomycin.  Was tested because of diarrhea, abdominal.  Bowels are back to normal now.  Completed it 2 weeks ago.   [place]: 1. Subtle liver surface nodularity with coarsened, heterogeneous parenchymal echogenicity, suspicious for cirrhosis.  She is immune to hepatitis B based on labs 2019.  I do not believe she has ever been tested for hepatitis A immunity.   Was never told she had liver problems in the past.  Never had hepatitis.  No liver disease in her family except for an alcoholic uncle.  Chief complaint is elevated liver test, cirrhosis  ROS: complete GI ROS as described in HPI, all other review negative.  Constitutional:  No unintentional weight loss   Past Medical  History:  Diagnosis Date  . Anemia   . Chronic kidney disease    STAGE 3 CHRONIC KIDNEY DISEASE SECONDARY TO DIABETIC GLOMERULOSCLEROSIS AND UNCONTROLLED HYPERTENSION - PER OFFICE NOTES DR. [facility].  Marland Kitchen ESRD on dialysis [facility])    "MWF; [address] (05/06/2016)  . Gout   . HCAP (healthcare-associated pneumonia) 05/06/2016  .  History of blood transfusion    "related to surgery"  . Hyperlipidemia   . Hypertension   . Morbid obesity ([place])   . Pain    LEFT SHOULDER PAIN - WAS SEEN AT AN URGENT CARE - GIVEN SLING FOR COMFORT AND TOLD ROM AS TOLERATED.  Marland Kitchen Palpitations 09/24/2016  . Type II diabetes mellitus ([place])    "gastric sleeve OR corrected this" (05/06/2016)    Past Surgical History:  Procedure Laterality Date  . AV FISTULA PLACEMENT Left 01/03/2015   Procedure: BRACHIAL CEPHALIC ARTERIOVENOUS  FISTULA CREATION LEFT ARM;  Surgeon: Angelia Mould, MD;  Location: [place];  Service: Vascular;  Laterality: Left;  . CARPAL TUNNEL RELEASE Right   . [place]  . CHOLECYSTECTOMY  09/14/2017  . ESOPHAGOGASTRODUODENOSCOPY N/A 09/04/2012   Procedure: ESOPHAGOGASTRODUODENOSCOPY (EGD);  Surgeon: Shann Medal, MD;  [facility];  Service: General;  Laterality: N/A;  PF  . ESOPHAGOGASTRODUODENOSCOPY (EGD) WITH ESOPHAGEAL DILATION N/A 09/29/2012   Procedure: ESOPHAGOGASTRODUODENOSCOPY (EGD) WITH ESOPHAGEAL DILATION;  Surgeon: Milus Banister, MD;  [facility];  Service: Endoscopy;  Laterality: N/A;  . INSERTION OF DIALYSIS CATHETER N/A 01/03/2015   Procedure: INSERTION OF DIALYSIS CATHETER RIGHT INTERNAL JUGULAR;  Surgeon: Angelia Mould, MD;  Location: [place];  Service: Vascular;  Laterality: N/A;  . LAPAROSCOPIC GASTRIC SLEEVE RESECTION N/A 07/19/2012   Procedure: LAPAROSCOPIC SLEEVE GASTRECTOMY with EGD;  Surgeon: Madilyn Hook, DO;  [facility];  Service: General;  Laterality: N/A;  laparoscopic sleeve gastrectomy with EGD  . LEFT HEART CATH AND CORONARY ANGIOGRAPHY N/A 02/25/2018   Procedure: LEFT HEART CATH AND CORONARY ANGIOGRAPHY;  Surgeon: Leonie Man, MD;  Location: [place] [facility];  Service: Cardiovascular;  Laterality: N/A;  . PERIPHERAL VASCULAR CATHETERIZATION Left 05/23/2015   Procedure: Nolon Stalls;  Surgeon: Conrad [place], MD;  Location: [place] [facility]  LAB;  Service: Cardiovascular;  Laterality: Left;  upper aRM  . PERIPHERAL VASCULAR CATHETERIZATION Left 05/23/2015   Procedure: Peripheral Vascular Balloon Angioplasty;  Surgeon: Conrad [place], MD;  Location: [place] CV LAB;  Service: Cardiovascular;  Laterality: Left;  av fistula  . TUBAL LIGATION  1993  . UPPER GI ENDOSCOPY N/A 07/19/2012   Procedure: UPPER GI ENDOSCOPY;  Surgeon: Madilyn Hook, DO;  [facility];  Service: General;  Laterality: N/A;    Current Outpatient Medications  Medication Sig Dispense Refill  . acetaminophen (TYLENOL) 325 MG tablet Take 650 mg by mouth every 6 (six) hours as needed for mild pain.    Marland Kitchen aspirin EC 81 MG EC tablet Take 1 tablet (81 mg total) by mouth daily. 30 tablet 0  . atorvastatin (LIPITOR) 80 MG tablet Take 1 tablet (80 mg total) by mouth daily at 6 PM. 90 tablet 1  . AURYXIA 1 GM 210 MG(Fe) tablet Take 210 mg by mouth 3 (three) times daily with meals.   3  . cinacalcet (SENSIPAR) 30 MG tablet Take 1 tablet (30 mg total) by mouth daily with supper. 90 tablet 0  . Cyanocobalamin (B-12 PO) Take 1 tablet by mouth daily.    Marland Kitchen  folic acid (FOLVITE) 1 MG tablet Take 1 tablet (1 mg total) by mouth daily. 90 tablet 1  . gentamicin cream (GARAMYCIN) 0.1 % Apply 1 application topically See admin instructions. Apply to exit site daily as directed.  4  . midodrine (PROAMATINE) 2.5 MG tablet Take 1 tablet (2.5 mg total) by mouth 3 (three) times daily with meals. 90 tablet 0  . ondansetron (ZOFRAN ODT) 8 MG disintegrating tablet Take 1 tablet (8 mg total) by mouth every 8 (eight) hours as needed for nausea or vomiting. 10 tablet 0   No current facility-administered medications for this visit.     Allergies as of 05/25/2018 - Review Complete 05/24/2018  Allergen Reaction Noted  . Amlodipine Swelling 12/07/2011  . Clonidine derivatives Swelling 03/22/2012  . Doxycycline Nausea And Vomiting 03/27/2018  . Welchol [colesevelam hcl] Nausea Only 06/28/2012     Family History  Problem Relation Age of Onset  . Hypertension Mother   . Mitral valve prolapse Mother   . Diabetes Father   . Arthritis Other   . Hyperlipidemia Other   . Cancer Neg Hx   . Heart disease Neg Hx   . Kidney disease Neg Hx   . Stroke Neg Hx     Social History   Socioeconomic History  . Marital status: Married    Spouse name: Not on file  . Number of children: 3  . Years of education: Not on file  . Highest education level: Not on file  Occupational History  . Occupation: Research scientist (physical sciences): [employer]  Social Needs  . Financial resource strain: Not on file  . Food insecurity:    Worry: Not on file    Inability: Not on file  . Transportation needs:    Medical: Not on file    Non-medical: Not on file  Tobacco Use  . Smoking status: Former Smoker    Packs/day: 1.00    Years: 16.00    Pack years: 16.00    Types: Cigarettes    Last attempt to quit: 2009    Years since quitting: 11.3  . Smokeless tobacco: Never Used  Substance and Sexual Activity  . Alcohol use: Never    Frequency: Never  . Drug use: No  . Sexual activity: Yes    Birth control/protection: Post-menopausal  Lifestyle  . Physical activity:    Days per week: Not on file    Minutes per session: Not on file  . Stress: Not on file  Relationships  . Social connections:    Talks on phone: Not on file    Gets together: Not on file    Attends religious service: Not on file    Active member of club or organization: Not on file    Attends meetings of clubs or organizations: Not on file    Relationship status: Not on file  . Intimate partner violence:    Fear of current or ex partner: Not on file    Emotionally abused: Not on file    Physically abused: Not on file    Forced sexual activity: Not on file  Other Topics Concern  . Not on file  Social History Narrative  . Not on file     Physical Exam: Unable to perform because this was a "telemed visit" due to current Covid-19  pandemic  Assessment and plan: 53 y.o. female with elevated liver tests, likely underlying cirrhosis, well compensated  First I am very impressed by how well she is done  since hospitalization 6 weeks ago.  She had significant mental status changes that ended up being due to uremia.  She had never had problems for about a year and a half with peritoneal dialysis and then had staph infection in her peritoneum followed by uremia.  I wonder if the 2 illnesses were somehow connected.  Either way her mental status has completely resolved after instituting hemodialysis.  She did have hemodialysis today and she thinks that this is going to be her last time with it and instead will be starting back on peritoneal dialysis as long as her surgeon thinks it is safe.  Clearly her mental status changes were not from hepatic encephalopathy as was felt for a day or 2 during her initial hospital stay.  Second several imaging studies have suggested that she has a nodular liver.  A battery of blood tests in 2019 show no clear etiology and so I am very suspicious that this is from longstanding fatty liver disease.  She has no hepatic encephalopathy.  No obvious ascites or edema.  I think very likely she has well compensated cirrhosis.  She will in the near future need EGD to screen for varices.  I would probably do that at the same time as a screening colonoscopy if she has not had one.  She will get a basic set of labs today to calculate meld score, check her for immunity to hepatitis A.  If she is not then she will need to be immunized.  She is already immune to hepatitis B based on serologies.  She has also never had alpha-fetoprotein level checked and I will check that for her as well.  Please see the "Patient Instructions" section for addition details about the plan.  Owens Loffler, MD [facility] 05/25/2018, 2:59 PM

## 2018-05-25 NOTE — Patient Instructions (Addendum)
She knows to come in later today or tomorrow for blood work: Hepatitis a total antibody, alpha-fetoprotein screening for hepatoma, CBC, complete metabolic profile, coags.  After I see those results we will contact her about timing of further blood tests and return office visit.  Your provider has requested that you go to the basement level for lab work today or tomorrow. Press "B" on the elevator. The lab is located at the first door on the left as you exit the elevator.

## 2018-05-25 NOTE — Telephone Encounter (Signed)
Patient called stating when she was in the hospital they misplaced her atorvastatin (LIPITOR) 80 MG tablet.  She tired to get it refilled, but insurance company won't cover it as it is too soon.  She is wondering if there is any way we can get a one time override for it to be covered for her.  As she had a virtual visit with Kerin Ransom on 5/4 and he advised her that her cholesterol was high and she needed to continue taking the medication.

## 2018-05-25 NOTE — Telephone Encounter (Signed)
Copied from [place] [id number]. Topic: Quick Communication - Home Health Verbal Orders >> May 25, 2018  4:11 PM Margot Ables wrote: Caller/Agency: Madelyn Brunner PT with [facility] Number: [phone], secure VM Requesting OT/PT/Skilled Nursing/Social Work/Speech Therapy: PT Frequency: pt missed 2 visits (one last week, one this week). Requesting VO to extend PT for 2 visits

## 2018-05-27 ENCOUNTER — Other Ambulatory Visit (INDEPENDENT_AMBULATORY_CARE_PROVIDER_SITE_OTHER): Payer: 59

## 2018-05-27 ENCOUNTER — Telehealth: Payer: Self-pay

## 2018-05-27 DIAGNOSIS — K7460 Unspecified cirrhosis of liver: Secondary | ICD-10-CM | POA: Diagnosis not present

## 2018-05-27 LAB — COMPREHENSIVE METABOLIC PANEL
ALT: 23 U/L (ref 0–35)
AST: 30 U/L (ref 0–37)
Albumin: 3.7 g/dL (ref 3.5–5.2)
Alkaline Phosphatase: 103 U/L (ref 39–117)
BUN: 20 mg/dL (ref 6–23)
CO2: 29 mEq/L (ref 19–32)
Calcium: 8.8 mg/dL (ref 8.4–10.5)
Chloride: 98 mEq/L (ref 96–112)
Creatinine, Ser: 6.08 mg/dL (ref 0.40–1.20)
GFR: 8.73 mL/min — CL (ref >60.00)
Glucose, Bld: 161 mg/dL — ABNORMAL HIGH (ref 70–99)
Potassium: 3.6 mEq/L (ref 3.5–5.1)
Sodium: 141 mEq/L (ref 135–145)
Total Bilirubin: 0.5 mg/dL (ref 0.2–1.2)
Total Protein: 7 g/dL (ref 6.0–8.3)

## 2018-05-27 LAB — CBC WITH DIFFERENTIAL/PLATELET
Basophils Absolute: 0.1 10*3/uL (ref 0.0–0.1)
Basophils Relative: 1.5 % (ref 0.0–3.0)
Eosinophils Absolute: 0.4 10*3/uL (ref 0.0–0.7)
Eosinophils Relative: 5.4 % — ABNORMAL HIGH (ref 0.0–5.0)
HCT: 32.5 % — ABNORMAL LOW (ref 36.0–46.0)
Hemoglobin: 10.7 g/dL — ABNORMAL LOW (ref 12.0–15.0)
Lymphocytes Relative: 23.6 % (ref 12.0–46.0)
Lymphs Abs: 1.8 10*3/uL (ref 0.7–4.0)
MCHC: 33.1 g/dL (ref 30.0–36.0)
MCV: 96.5 fl (ref 78.0–100.0)
Monocytes Absolute: 0.6 10*3/uL (ref 0.1–1.0)
Monocytes Relative: 8.7 % (ref 3.0–12.0)
Neutro Abs: 4.5 10*3/uL (ref 1.4–7.7)
Neutrophils Relative %: 60.8 % (ref 43.0–77.0)
Platelets: 375 10*3/uL (ref 150.0–400.0)
RBC: 3.37 Mil/uL — ABNORMAL LOW (ref 3.87–5.11)
RDW: 15.2 % (ref 11.5–15.5)
WBC: 7.4 10*3/uL (ref 4.0–10.5)

## 2018-05-27 LAB — PROTIME-INR
INR: 1 ratio (ref 0.8–1.0)
Prothrombin Time: 11.7 s (ref 9.6–13.1)

## 2018-05-27 NOTE — Telephone Encounter (Signed)
Thanks you for letting me know.  This patient has chronic renal failure and is on dialysis.  So this is a typical creatinine value for her.  I reviewed the entire metabolic panel and also see that the potassium is normal.  You do not need to take any further action.  - HD

## 2018-05-27 NOTE — Telephone Encounter (Signed)
[facility] called back and states that pt also missed todays appointment so they want to extend that as well.

## 2018-05-27 NOTE — Telephone Encounter (Signed)
Recd call from [facility] 12:03pm- per [facility] pt has a critical lab   Creat- 6.08 and GFR 8.73  Please advise

## 2018-05-30 LAB — HEPATITIS A ANTIBODY, TOTAL: Hepatitis A AB,Total: NONREACTIVE

## 2018-05-30 LAB — AFP TUMOR MARKER: AFP-Tumor Marker: 3.6 ng/mL

## 2018-05-31 NOTE — Telephone Encounter (Signed)
Gave verbal okay to extend as needed.

## 2018-06-01 ENCOUNTER — Ambulatory Visit (INDEPENDENT_AMBULATORY_CARE_PROVIDER_SITE_OTHER): Payer: 59 | Admitting: Family Medicine

## 2018-06-01 ENCOUNTER — Encounter: Payer: Self-pay | Admitting: Family Medicine

## 2018-06-01 ENCOUNTER — Ambulatory Visit: Payer: Self-pay

## 2018-06-01 ENCOUNTER — Other Ambulatory Visit: Payer: Self-pay

## 2018-06-01 VITALS — BP 112/80 | HR 95 | Ht 65.0 in | Wt 250.0 lb

## 2018-06-01 DIAGNOSIS — R2232 Localized swelling, mass and lump, left upper limb: Secondary | ICD-10-CM | POA: Insufficient documentation

## 2018-06-01 DIAGNOSIS — M79642 Pain in left hand: Secondary | ICD-10-CM

## 2018-06-01 MED ORDER — PREDNISONE 20 MG PO TABS: 20.0000 mg | ORAL_TABLET | Freq: Every day | ORAL | 0 refills | Status: DC

## 2018-06-01 MED ORDER — TRAMADOL HCL 50 MG PO TABS: 50.0000 mg | ORAL_TABLET | Freq: Three times a day (TID) | ORAL | 0 refills | Status: AC | PRN

## 2018-06-01 MED ORDER — METHYLPREDNISOLONE ACETATE 40 MG/ML IJ SUSP
40.0000 mg | Freq: Once | INTRAMUSCULAR | Status: AC
  Administered 2018-06-01: 40 mg via INTRAMUSCULAR

## 2018-06-01 NOTE — Patient Instructions (Signed)
gd to see you  I am concern of the hand.  It looks to be soft tissue swelling.  There is a chance for gout. Injection given today .  Start prednisone daily for 7 days.  Ask your kidney doc if colchicine or allopurinol would be safe for you to take please Also ask him if you need to be concern for the fistula they used last week.  Also tramadol 50 mg up to 5 times a day for sever pain.  If pain worsens then go to ER See me again in 5-10 days

## 2018-06-01 NOTE — Progress Notes (Signed)
[name] [place], [place] [address] Phone: [phone] Subjective:   Brenda Davis, am serving as a scribe for Dr. Hulan Saas.  I'm seeing this patient by the request  of:    CC: Left hand pain  [id number]  Brenda Davis is a 53 y.o. female coming in with complaint of left hand middle finger. Triggering began 3 weeks ago. Noticed a sticking sensation initially. Has progressed and last night she was unable to sleep due to pain.  Patient states that the pain seems to be severe and is going across her fingers at this point.  Past medical history is significant for end-stage renal disease status post peritoneal dialysis but history of AV fistula of the left upper extremity.     Past Medical History:  Diagnosis Date  . Anemia   . Chronic kidney disease    STAGE 3 CHRONIC KIDNEY DISEASE SECONDARY TO DIABETIC GLOMERULOSCLEROSIS AND UNCONTROLLED HYPERTENSION - PER OFFICE NOTES DR. [facility].  Marland Kitchen ESRD on dialysis [facility])    "MWF; [address] (05/06/2016)  . Gout   . HCAP (healthcare-associated pneumonia) 05/06/2016  . History of blood transfusion    "related to surgery"  . Hyperlipidemia   . Hypertension   . Morbid obesity ([place])   . Pain    LEFT SHOULDER PAIN - WAS SEEN AT AN URGENT CARE - GIVEN SLING FOR COMFORT AND TOLD ROM AS TOLERATED.  Marland Kitchen Palpitations 09/24/2016  . Type II diabetes mellitus ([place])    "gastric sleeve OR corrected this" (05/06/2016)   Past Surgical History:  Procedure Laterality Date  . AV FISTULA PLACEMENT Left 01/03/2015   Procedure: BRACHIAL CEPHALIC ARTERIOVENOUS  FISTULA CREATION LEFT ARM;  Surgeon: Angelia Mould, MD;  Location: [facility];  Service: Vascular;  Laterality: Left;  . CARPAL TUNNEL RELEASE Right   . [place]  . CHOLECYSTECTOMY  09/14/2017  . ESOPHAGOGASTRODUODENOSCOPY N/A 09/04/2012   Procedure: ESOPHAGOGASTRODUODENOSCOPY (EGD);  Surgeon:  Shann Medal, MD;  Location: [facility];  Service: General;  Laterality: N/A;  PF  . ESOPHAGOGASTRODUODENOSCOPY (EGD) WITH ESOPHAGEAL DILATION N/A 09/29/2012   Procedure: ESOPHAGOGASTRODUODENOSCOPY (EGD) WITH ESOPHAGEAL DILATION;  Surgeon: Milus Banister, MD;  Location: [facility];  Service: Endoscopy;  Laterality: N/A;  . INSERTION OF DIALYSIS CATHETER N/A 01/03/2015   Procedure: INSERTION OF DIALYSIS CATHETER RIGHT INTERNAL JUGULAR;  Surgeon: Angelia Mould, MD;  Location: [facility];  Service: Vascular;  Laterality: N/A;  . LAPAROSCOPIC GASTRIC SLEEVE RESECTION N/A 07/19/2012   Procedure: LAPAROSCOPIC SLEEVE GASTRECTOMY with EGD;  Surgeon: Madilyn Hook, DO;  Location: [facility];  Service: General;  Laterality: N/A;  laparoscopic sleeve gastrectomy with EGD  . LEFT HEART CATH AND CORONARY ANGIOGRAPHY N/A 02/25/2018   Procedure: LEFT HEART CATH AND CORONARY ANGIOGRAPHY;  Surgeon: Leonie Man, MD;  Location: [facility];  Service: Cardiovascular;  Laterality: N/A;  . PERIPHERAL VASCULAR CATHETERIZATION Left 05/23/2015   Procedure: [place]on Stalls;  Surgeon: Conrad Bollinger, MD;  Location: [facility];  Service: Cardiovascular;  Laterality: Left;  upper aRM  . PERIPHERAL VASCULAR CATHETERIZATION Left 05/23/2015   Procedure: Peripheral Vascular Balloon Angioplasty;  Surgeon: Conrad Brownsville, MD;  Location: [facility];  Service: Cardiovascular;  Laterality: Left;  av fistula  . TUBAL LIGATION  1993  . UPPER GI ENDOSCOPY N/A 07/19/2012   Procedure: UPPER GI ENDOSCOPY;  Surgeon: Madilyn Hook, DO;  Location: [facility];  Service: General;  Laterality: N/A;   Social History   Socioeconomic History  . Marital status: Married    Spouse name: Not on file  . Number of children: 3  . Years of education: Not on file  . Highest education level: Not on file  Occupational History  . Occupation: Research scientist (physical sciences): [employer]  Social Needs  . Financial resource strain: Not on file  . Food  insecurity:    Worry: Not on file    Inability: Not on file  . Transportation needs:    Medical: Not on file    Non-medical: Not on file  Tobacco Use  . Smoking status: Former Smoker    Packs/day: 1.00    Years: 16.00    Pack years: 16.00    Types: Cigarettes    Last attempt to quit: 2009    Years since quitting: 11.3  . Smokeless tobacco: Never Used  Substance and Sexual Activity  . Alcohol use: Never    Frequency: Never  . Drug use: Davis  . Sexual activity: Yes    Birth control/protection: Post-menopausal  Lifestyle  . Physical activity:    Days per week: Not on file    Minutes per session: Not on file  . Stress: Not on file  Relationships  . Social connections:    Talks on phone: Not on file    Gets together: Not on file    Attends religious service: Not on file    Active member of club or organization: Not on file    Attends meetings of clubs or organizations: Not on file    Relationship status: Not on file  Other Topics Concern  . Not on file  Social History Narrative  . Not on file   Allergies  Allergen Reactions  . Amlodipine Swelling  . Clonidine Derivatives Swelling    Limbs swell  . Doxycycline Nausea And Vomiting    "I threw up for 3 hours"  . Welchol [Colesevelam Hcl] Nausea Only   Family History  Problem Relation Age of Onset  . Hypertension Mother   . Mitral valve prolapse Mother   . Diabetes Father   . Arthritis Other   . Hyperlipidemia Other   . Cancer Neg Hx   . Heart disease Neg Hx   . Kidney disease Neg Hx   . Stroke Neg Hx     Current Outpatient Medications (Endocrine & Metabolic):  .  cinacalcet (SENSIPAR) 30 MG tablet, Take 1 tablet (30 mg total) by mouth daily with supper. .  predniSONE (DELTASONE) 20 MG tablet, Take 1 tablet (20 mg total) by mouth daily with breakfast.  Current Outpatient Medications (Cardiovascular):  .  atorvastatin (LIPITOR) 80 MG tablet, Take 1 tablet (80 mg total) by mouth daily at 6 PM. .  midodrine  (PROAMATINE) 2.5 MG tablet, Take 1 tablet (2.5 mg total) by mouth 3 (three) times daily with meals.   Current Outpatient Medications (Analgesics):  .  acetaminophen (TYLENOL) 325 MG tablet, Take 650 mg by mouth every 6 (six) hours as needed for mild pain. Marland Kitchen  aspirin EC 81 MG EC tablet, Take 1 tablet (81 mg total) by mouth daily. .  traMADol (ULTRAM) 50 MG tablet, Take 1 tablet (50 mg total) by mouth every 8 (eight) hours as needed for up to 5 days.  Current Outpatient Medications (Hematological):  Marland Kitchen  Cyanocobalamin (B-12 PO), Take 1 tablet by mouth daily. .  folic acid (FOLVITE) 1 MG tablet, Take 1 tablet (  1 mg total) by mouth daily.  Current Outpatient Medications (Other):  Marland Kitchen  AURYXIA 1 GM 210 MG(Fe) tablet, Take 210 mg by mouth 3 (three) times daily with meals.  Marland Kitchen  gentamicin cream (GARAMYCIN) 0.1 %, Apply 1 application topically See admin instructions. Apply to exit site daily as directed. .  ondansetron (ZOFRAN ODT) 8 MG disintegrating tablet, Take 1 tablet (8 mg total) by mouth every 8 (eight) hours as needed for nausea or vomiting.    Past medical history, social, surgical and family history all reviewed in electronic medical record.  Davis pertanent information unless stated regarding to the chief complaint.   Review of Systems:  Davis headache, visual changes, nausea, vomiting, diarrhea, constipation, dizziness, abdominal pain, skin rash, fevers, chills, night sweats, weight loss, swollen lymph nodes, , chest pain, shortness of breath, mood changes.  Positive muscle aches and joint swelling  Objective  Blood pressure 112/80, pulse 95, height 5' 5"  (1.651 m), weight 250 lb (113.4 kg), SpO2 99 %.     General: Davis apparent distress alert and oriented x3 mood and affect normal, dressed appropriately.  HEENT: Pupils equal, extraocular movements intact  Respiratory: Patient's speak in full sentences and does not appear short of breath  Cardiovascular: Davis lower extremity edema, non tender,  Davis erythema  Skin: Warm dry intact with Davis signs of infection or rash on extremities or on axial skeleton.  Abdomen: Soft nontender  Neuro: Cranial nerves II through XII are intact, neurovascularly intact in all extremities with 2+ DTRs and 2+ pulses.  Lymph: Davis lymphadenopathy of posterior or anterior cervical chain or axillae bilaterally.  Gait normal with good balance and coordination.  MSK:  Non tender with full range of motion and good stability and symmetric strength and tone of shoulders, elbows,  hip, knee and ankles bilaterally.  Mild arthritic changes noted Patient's left arm does show that patient does have a AV fistula on noted.  Davis erythema around the area.  Mild tenderness to palpation.  Davis swelling there.  Patient does have swelling across the MCP joints the PIP and DIP joints of fingers 2 through 4.  Patient unable to make a full fist secondary to the swelling.  Severely tender to even light palpation.  Limited musculoskeletal ultrasound was performed and interpreted by Lyndal Pulley  Limited ultrasound shows the patient does have significant soft tissue swelling.  Cannot rule out possible uric acid deposits versus possibility of early cellulitis.  Difficult to assess otherwise.  Moving up the arm Davis DVT of the radial artery noted but did not go up to the elbow area. Impression: Soft tissue swelling possible infectious etiology versus gout.    Impression and Recommendations:     This case required medical decision making of moderate complexity. The above documentation has been reviewed and is accurate and complete Lyndal Pulley, DO       Note: This dictation was prepared with Dragon dictation along with smaller phrase technology. Any transcriptional errors that result from this process are unintentional.

## 2018-06-01 NOTE — Assessment & Plan Note (Signed)
Patient does have more of a localized left hand swelling.  I am concerned of this with patient's recent use of an AV fistula that has not been used for some time.  Patient does have a history of sepsis as well.  There is a possibility for an infection that seems to have more distal seeding, also DVT or embolic event.  We discussed anti-inflammatories but patient wanted to check with her nephrologist.  Patient given an injection of Depo-Medrol as well as prednisone to cover for the possibility of gout.  Patient is already taking antibiotic with her peritoneal dialysis she states.  Patient will talk with her nephrologist as well to see if we need to expand coverage.  Patient will need avoid significant amount of other medicines status post sleeve gastrectomy.  Patient knows if significant worsening pain or swelling she needs to seek medical attention immediately.  Did not get any labs such as d-dimer with this not being accurate with patient and renal failure.  Will check in with patient again in 24 hours and then consider a follow-up within the next 72 hours if not making improvement.

## 2018-06-02 ENCOUNTER — Telehealth: Payer: Self-pay

## 2018-06-02 ENCOUNTER — Telehealth: Payer: Self-pay | Admitting: Internal Medicine

## 2018-06-02 NOTE — Telephone Encounter (Signed)
Copied from [place] [id number]. Topic: Quick Communication - Home Health Verbal Orders >> Jun 02, 2018  2:46 PM Berneta Levins wrote: Caller/Agency: [name] from [facility] Number: [phone], OK to leave a message Requesting OT/PT/Skilled Nursing/Social Work/Speech Therapy: PT Frequency: discharge order for PT for 06/07/2018

## 2018-06-02 NOTE — Telephone Encounter (Signed)
Spoke with patient. She is feeling better today. Had a slight decrease in her swelling in the fingers. Is using Tramadol for pain. No discoloration in hand/fingers. Recommended if pain increases again and becomes unmanageable or fingers become discolored to go into ER. Patient voices understanding.

## 2018-06-03 NOTE — Telephone Encounter (Signed)
Called [name] back no answer LMOM w/MD response.Marland KitchenJohny Davis

## 2018-06-03 NOTE — Telephone Encounter (Signed)
Fine

## 2018-06-03 NOTE — Telephone Encounter (Signed)
MD is out of the office pls advise on msg below.Marland KitchenJohny Chess

## 2018-06-06 ENCOUNTER — Ambulatory Visit: Payer: 59 | Admitting: Family Medicine

## 2018-06-07 ENCOUNTER — Telehealth: Payer: 59 | Admitting: Physician Assistant

## 2018-06-07 DIAGNOSIS — B373 Candidiasis of vulva and vagina: Secondary | ICD-10-CM

## 2018-06-07 DIAGNOSIS — B3731 Acute candidiasis of vulva and vagina: Secondary | ICD-10-CM

## 2018-06-07 MED ORDER — FLUCONAZOLE 150 MG PO TABS: 150.0000 mg | ORAL_TABLET | Freq: Once | ORAL | 0 refills | Status: AC

## 2018-06-07 NOTE — Progress Notes (Signed)
We are sorry that you are not feeling well. Here is how we plan to help! Based on what you shared with me it looks like you: May have a yeast vaginosis   Vaginosis is an inflammation of the vagina that can result in discharge, itching and pain. The cause is usually a change in the normal balance of vaginal bacteria or an infection. Vaginosis can also result from reduced estrogen levels after menopause.  The most common causes of vaginosis are:   Bacterial vaginosis which results from an overgrowth of one on several organisms that are normally present in your vagina.   Yeast infections which are caused by a naturally occurring fungus called candida.   Vaginal atrophy (atrophic vaginosis) which results from the thinning of the vagina from reduced estrogen levels after menopause.   Trichomoniasis which is caused by a parasite and is commonly transmitted by sexual intercourse.  Factors that increase your risk of developing vaginosis include: Medications, such as antibiotics and steroids Uncontrolled diabetes Use of hygiene products such as bubble bath, vaginal spray or vaginal deodorant Douching Wearing damp or tight-fitting clothing Using an intrauterine device (IUD) for birth control Hormonal changes, such as those associated with pregnancy, birth control pills or menopause Sexual activity Having a sexually transmitted infection  Your treatment plan is A single Diflucan (fluconazole) 159m tablet once.  I have electronically sent this prescription into the pharmacy that you have chosen.  Be sure to take all of the medication as directed. Stop taking any medication if you develop a rash, tongue swelling or shortness of breath. Mothers who are breast feeding should consider pumping and discarding their breast milk while on these antibiotics. However, there is no consensus that infant exposure at these doses would be harmful.  Remember that medication creams can weaken latex  condoms. .Marland Kitchen  HOME CARE:  Good hygiene may prevent some types of vaginosis from recurring and may relieve some symptoms:  Avoid baths, hot tubs and whirlpool spas. Rinse soap from your outer genital area after a shower, and dry the area well to prevent irritation. Don't use scented or harsh soaps, such as those with deodorant or antibacterial action. Avoid irritants. These include scented tampons and pads. Wipe from front to back after using the toilet. Doing so avoids spreading fecal bacteria to your vagina.  Other things that may help prevent vaginosis include:  Don't douche. Your vagina doesn't require cleansing other than normal bathing. Repetitive douching disrupts the normal organisms that reside in the vagina and can actually increase your risk of vaginal infection. Douching won't clear up a vaginal infection. Use a latex condom. Both female and female latex condoms may help you avoid infections spread by sexual contact. Wear cotton underwear. Also wear pantyhose with a cotton crotch. If you feel comfortable without it, skip wearing underwear to bed. Yeast thrives in mCampbell SoupYour symptoms should improve in the next day or two.  GET HELP RIGHT AWAY IF:  You have pain in your lower abdomen ( pelvic area or over your ovaries) You develop nausea or vomiting You develop a fever Your discharge changes or worsens You have persistent pain with intercourse You develop shortness of breath, a rapid pulse, or you faint.  These symptoms could be signs of problems or infections that need to be evaluated by a medical provider now.  MAKE SURE YOU   Understand these instructions. Will watch your condition. Will get help right away if you are not doing well or get  worse.  Your e-visit answers were reviewed by a board certified advanced clinical practitioner to complete your personal care plan. Depending upon the condition, your plan could have included both over the counter or  prescription medications. Please review your pharmacy choice to make sure that you have choses a pharmacy that is open for you to pick up any needed prescription, Your safety is important to [place]. If you have drug allergies check your prescription carefully.   You can use MyChart to ask questions about today's visit, request a non-urgent call back, or ask for a work or school excuse for 24 hours related to this e-Visit. If it has been greater than 24 hours you will need to follow up with your provider, or enter a new e-Visit to address those concerns. You will get a MyChart message within the next two days asking about your experience. I hope that your e-visit has been valuable and will speed your recovery.  ===View-only below this line===   ----- Message -----    From: [name]    Sent: 06/07/2018  8:59 AM EDT      To: E-Visit Mailing List Subject: E-Visit Submission: Vaginal Symptoms  E-Visit Submission: Vaginal Symptoms --------------------------------  Question: Which of the following are you experiencing? Answer:   Vaginal itching  Question: Are you having pain while passing urine? Answer:   No, I have no pain while urinating  Question: Which of the following applies to your vaginal discharge Answer:   I have no discharge  Question: Which of the following are you experiencing? Answer:   None of the above  Question: Do you have any sores on your genitals? Answer:   No  Question: Have you taken antibiotics recently? Answer:   Yes, I am currently taking them  Question: Please enter when you took antibiotics, and the name of the medicine, if you know it. Answer:   I'm not sure of the name, they are taken through fluid that is put into my stomach to absorb thru my pd cather  Question: Do you do any of the following? Answer:   None of the above  Question: Which of the following applies to your menstrual period? Answer:   I have not had a menstrual period for a  while  Question: Have you had similar symptoms in the past? Answer:   Yes, I have had similar symptoms before  Question: When you had similar symptoms in  the past, did any of the following work? Answer:   Pills for yeast infection            Pills for urine infection  Question: Do you have a fever? Answer:   No, I do not have a fever  Question: During the past 2 months, have you had sexual contact with a specific person for the first time? Answer:   No  Question: Has a person with whom you have had sexual contact been recently told they have a disease possibly acquired through sex? Answer:   No  Question: Please list your medication allergies that you may have ? (If 'none' , please list as 'none') Answer:   Chlonidine, welchol,  amlodophine  Question: Please list any additional comments  Answer:     A total of 5-10 minutes was spent evaluating this patients questionnaire and formulating a plan of care.

## 2018-06-09 ENCOUNTER — Other Ambulatory Visit: Payer: Self-pay | Admitting: Gastroenterology

## 2018-06-09 ENCOUNTER — Encounter: Payer: Self-pay | Admitting: Family Medicine

## 2018-06-09 ENCOUNTER — Other Ambulatory Visit: Payer: Self-pay

## 2018-06-09 ENCOUNTER — Ambulatory Visit (INDEPENDENT_AMBULATORY_CARE_PROVIDER_SITE_OTHER): Payer: 59 | Admitting: Family Medicine

## 2018-06-09 DIAGNOSIS — R2232 Localized swelling, mass and lump, left upper limb: Secondary | ICD-10-CM | POA: Diagnosis not present

## 2018-06-09 DIAGNOSIS — I8500 Esophageal varices without bleeding: Secondary | ICD-10-CM

## 2018-06-09 DIAGNOSIS — K7460 Unspecified cirrhosis of liver: Secondary | ICD-10-CM

## 2018-06-09 NOTE — Progress Notes (Signed)
[name] [address], [place] [address] Phone: [phone] Subjective:   I Brenda Davis am serving as a Education administrator for Dr. Hulan Saas.  CC: hand Pain follow-up  [id number]   06/01/2018 Patient does have more of a localized left hand swelling.  I am concerned of this with patient's recent use of an AV fistula that has not been used for some time.  Patient does have a history of sepsis as well.  There is a possibility for an infection that seems to have more distal seeding, also DVT or embolic event.  We discussed anti-inflammatories but patient wanted to check with her nephrologist.  Patient given an injection of Depo-Medrol as well as prednisone to cover for the possibility of gout.  Patient is already taking antibiotic with her peritoneal dialysis she states.  Patient will talk with her nephrologist as well to see if we need to expand coverage.  Patient will need avoid significant amount of other medicines status post sleeve gastrectomy.  Patient knows if significant worsening pain or swelling she needs to seek medical attention immediately.  Did not get any labs such as d-dimer with this not being accurate with patient and renal failure.  Will check in with patient again in 24 hours and then consider a follow-up within the next 72 hours if not making improvement.  06/09/2018 Brenda Davis is a 53 y.o. female coming in with complaint of left hand pain. States that it feels a lot better. Released from PT this past Tuesday.  Patient states feeling approximately 100% better.  Feels that it was potentially gout more than anything else.  Patient did not finish the antibiotic.  Patient did have laboratory testing done with her nephrologist.       Past Medical History:  Diagnosis Date   Anemia    Chronic kidney disease    STAGE 3 CHRONIC KIDNEY DISEASE SECONDARY TO DIABETIC GLOMERULOSCLEROSIS AND UNCONTROLLED HYPERTENSION - PER OFFICE NOTES  DR. [facility]   ESRD on dialysis [facility])    "MWF; [address] (05/06/2016)   Gout    HCAP (healthcare-associated pneumonia) 05/06/2016   History of blood transfusion    "related to surgery"   Hyperlipidemia    Hypertension    Morbid obesity ([place])    Pain    LEFT SHOULDER PAIN - WAS SEEN AT AN URGENT CARE - GIVEN SLING FOR COMFORT AND TOLD ROM AS TOLERATED.   Palpitations 09/24/2016   Type II diabetes mellitus ([place])    "gastric sleeve OR corrected this" (05/06/2016)   Past Surgical History:  Procedure Laterality Date   AV FISTULA PLACEMENT Left 01/03/2015   Procedure: BRACHIAL CEPHALIC ARTERIOVENOUS  FISTULA CREATION LEFT ARM;  Surgeon: Angelia Mould, MD;  Location: [facility];  Service: Vascular;  Laterality: Left;   CARPAL TUNNEL RELEASE Right    [place]  09/14/2017   ESOPHAGOGASTRODUODENOSCOPY N/A 09/04/2012   Procedure: ESOPHAGOGASTRODUODENOSCOPY (EGD);  Surgeon: Shann Medal, MD;  Location: [facility];  Service: General;  Laterality: N/A;  PF   ESOPHAGOGASTRODUODENOSCOPY (EGD) WITH ESOPHAGEAL DILATION N/A 09/29/2012   Procedure: ESOPHAGOGASTRODUODENOSCOPY (EGD) WITH ESOPHAGEAL DILATION;  Surgeon: Milus Banister, MD;  Location: [facility];  Service: Endoscopy;  Laterality: N/A;   INSERTION OF DIALYSIS CATHETER N/A 01/03/2015   Procedure: INSERTION OF DIALYSIS CATHETER RIGHT INTERNAL JUGULAR;  Surgeon: Angelia Mould, MD;  Location: [facility];  Service: Vascular;  Laterality: N/A;  LAPAROSCOPIC GASTRIC SLEEVE RESECTION N/A 07/19/2012   Procedure: LAPAROSCOPIC SLEEVE GASTRECTOMY with EGD;  Surgeon: Madilyn Hook, DO;  Location: [facility];  Service: General;  Laterality: N/A;  laparoscopic sleeve gastrectomy with EGD   LEFT HEART CATH AND CORONARY ANGIOGRAPHY N/A 02/25/2018   Procedure: LEFT HEART CATH AND CORONARY ANGIOGRAPHY;  Surgeon: Leonie Man, MD;  Location: [facility];  Service:  Cardiovascular;  Laterality: N/A;   PERIPHERAL VASCULAR CATHETERIZATION Left 05/23/2015   Procedure: Nolon Stalls;  Surgeon: Conrad [place], MD;  Location: [facility];  Service: Cardiovascular;  Laterality: Left;  upper aRM   PERIPHERAL VASCULAR CATHETERIZATION Left 05/23/2015   Procedure: Peripheral Vascular Balloon Angioplasty;  Surgeon: Conrad Whitfield, MD;  Location: [facility];  Service: Cardiovascular;  Laterality: Left;  av fistula   TUBAL LIGATION  1993   UPPER GI ENDOSCOPY N/A 07/19/2012   Procedure: UPPER GI ENDOSCOPY;  Surgeon: Madilyn Hook, DO;  Location: [facility];  Service: General;  Laterality: N/A;   Social History   Socioeconomic History   Marital status: Married    Spouse name: Not on file   Number of children: 3   Years of education: Not on file   Highest education level: Not on file  Occupational History   Occupation: Research scientist (physical sciences): [employer]  Social Needs   Financial resource strain: Not on file   Food insecurity:    Worry: Not on file    Inability: Not on file   Transportation needs:    Medical: Not on file    Non-medical: Not on file  Tobacco Use   Smoking status: Former Smoker    Packs/day: 1.00    Years: 16.00    Pack years: 16.00    Types: Cigarettes    Last attempt to quit: 2009    Years since quitting: 11.3   Smokeless tobacco: Never Used  Substance and Sexual Activity   Alcohol use: Never    Frequency: Never   Drug use: No   Sexual activity: Yes    Birth control/protection: Post-menopausal  Lifestyle   Physical activity:    Days per week: Not on file    Minutes per session: Not on file   Stress: Not on file  Relationships   Social connections:    Talks on phone: Not on file    Gets together: Not on file    Attends religious service: Not on file    Active member of club or organization: Not on file    Attends meetings of clubs or organizations: Not on file    Relationship status: Not on file  Other  Topics Concern   Not on file  Social History Narrative   Not on file   Allergies  Allergen Reactions   Amlodipine Swelling   Clonidine Derivatives Swelling    Limbs swell   Doxycycline Nausea And Vomiting    "I threw up for 3 hours"   Welchol [Colesevelam Hcl] Nausea Only   Family History  Problem Relation Age of Onset   Hypertension Mother    Mitral valve prolapse Mother    Diabetes Father    Arthritis Other    Hyperlipidemia Other    Cancer Neg Hx    Heart disease Neg Hx    Kidney disease Neg Hx    Stroke Neg Hx     Current Outpatient Medications (Endocrine & Metabolic):    cinacalcet (SENSIPAR) 30 MG tablet, Take 1 tablet (30 mg total)  by mouth daily with supper.   predniSONE (DELTASONE) 20 MG tablet, Take 1 tablet (20 mg total) by mouth daily with breakfast.  Current Outpatient Medications (Cardiovascular):    atorvastatin (LIPITOR) 80 MG tablet, Take 1 tablet (80 mg total) by mouth daily at 6 PM.   midodrine (PROAMATINE) 2.5 MG tablet, Take 1 tablet (2.5 mg total) by mouth 3 (three) times daily with meals.   Current Outpatient Medications (Analgesics):    acetaminophen (TYLENOL) 325 MG tablet, Take 650 mg by mouth every 6 (six) hours as needed for mild pain.   aspirin EC 81 MG EC tablet, Take 1 tablet (81 mg total) by mouth daily.  Current Outpatient Medications (Hematological):    Cyanocobalamin (B-12 PO), Take 1 tablet by mouth daily.   folic acid (FOLVITE) 1 MG tablet, Take 1 tablet (1 mg total) by mouth daily.  Current Outpatient Medications (Other):    AURYXIA 1 GM 210 MG(Fe) tablet, Take 210 mg by mouth 3 (three) times daily with meals.    gentamicin cream (GARAMYCIN) 0.1 %, Apply 1 application topically See admin instructions. Apply to exit site daily as directed.   ondansetron (ZOFRAN ODT) 8 MG disintegrating tablet, Take 1 tablet (8 mg total) by mouth every 8 (eight) hours as needed for nausea or vomiting.    Past medical  history, social, surgical and family history all reviewed in electronic medical record.  No pertanent information unless stated regarding to the chief complaint.   Review of Systems:  No headache, visual changes, nausea, vomiting, diarrhea, constipation, dizziness, abdominal pain, skin rash, fevers, chills, night sweats, weight loss, swollen lymph nodes, body aches, joint swelling,, chest pain, shortness of breath, mood changes.  Positive muscle aches  Objective  Blood pressure (!) 140/92, pulse 70, height 5' 5"  (1.651 m), weight 256 lb (116.1 kg), SpO2 98 %.   General: No apparent distress alert and oriented x3 mood and affect normal, dressed appropriately.  HEENT: Pupils equal, extraocular movements intact  Respiratory: Patient's speak in full sentences and does not appear short of breath  Cardiovascular: No lower extremity edema, non tender, no erythema  Skin: Warm dry intact with no signs of infection or rash on extremities or on axial skeleton.  Abdomen: Soft nontender  Neuro: Cranial nerves II through XII are intact, neurovascularly intact in all extremities with 2+ DTRs and 2+ pulses.  Lymph: No lymphadenopathy of posterior or anterior cervical chain or axillae bilaterally.  Gait normal with good balance and coordination.  MSK: Left hand exam shows the patient is doing much better with no significant swelling.  Can move the fingers fine.  Able to touch the hand.  Good grip strength.  No erythema around patient's fistula.   Impression and Recommendations:    y. The above documentation has been reviewed and is accurate and complete Lyndal Pulley, DO       Note: This dictation was prepared with Dragon dictation along with smaller phrase technology. Any transcriptional errors that result from this process are unintentional.

## 2018-06-09 NOTE — Assessment & Plan Note (Signed)
Improved at this time.  No significant change in management.  Patient can follow-up as needed.  May need allopurinol in the long run.

## 2018-06-14 ENCOUNTER — Telehealth: Payer: Self-pay | Admitting: Internal Medicine

## 2018-06-14 NOTE — Telephone Encounter (Signed)
Patient called [facility] 05/23 at 3pm.  States she was seen a week or so ago and was diagnosed with gout in her hand and was prescribed prednisone.  Took last dose on Thursday.  Now having gout symptoms in right ankle.  Reports ankle is slightly puffy and tender.  Painful to bear weight.  Patient was advised to see PCP in 3 days.  Please advise.

## 2018-06-14 NOTE — Telephone Encounter (Signed)
Called pt and she stated that she when she stepped out of the car, she stepped into a bed of fire ants and the area around her ankle is swollen from the fire ant bites.

## 2018-06-16 ENCOUNTER — Ambulatory Visit (INDEPENDENT_AMBULATORY_CARE_PROVIDER_SITE_OTHER): Payer: 59 | Admitting: Gastroenterology

## 2018-06-16 DIAGNOSIS — Z23 Encounter for immunization: Secondary | ICD-10-CM

## 2018-06-18 ENCOUNTER — Emergency Department (HOSPITAL_COMMUNITY): Payer: Medicare Other

## 2018-06-18 ENCOUNTER — Encounter (HOSPITAL_COMMUNITY): Payer: Self-pay

## 2018-06-18 ENCOUNTER — Inpatient Hospital Stay (HOSPITAL_COMMUNITY)
Admission: EM | Admit: 2018-06-18 | Discharge: 2018-06-21 | DRG: 280 | Disposition: A | Discharge status: Home / Self Care | Payer: Medicare Other | Attending: Internal Medicine | Admitting: Internal Medicine

## 2018-06-18 ENCOUNTER — Other Ambulatory Visit: Payer: Self-pay

## 2018-06-18 DIAGNOSIS — Z9851 Tubal ligation status: Secondary | ICD-10-CM

## 2018-06-18 DIAGNOSIS — I25110 Atherosclerotic heart disease of native coronary artery with unstable angina pectoris: Secondary | ICD-10-CM | POA: Diagnosis present

## 2018-06-18 DIAGNOSIS — I2510 Atherosclerotic heart disease of native coronary artery without angina pectoris: Secondary | ICD-10-CM | POA: Diagnosis not present

## 2018-06-18 DIAGNOSIS — M109 Gout, unspecified: Secondary | ICD-10-CM | POA: Diagnosis present

## 2018-06-18 DIAGNOSIS — N186 End stage renal disease: Secondary | ICD-10-CM | POA: Diagnosis present

## 2018-06-18 DIAGNOSIS — Z888 Allergy status to other drugs, medicaments and biological substances status: Secondary | ICD-10-CM

## 2018-06-18 DIAGNOSIS — E876 Hypokalemia: Secondary | ICD-10-CM | POA: Diagnosis present

## 2018-06-18 DIAGNOSIS — I120 Hypertensive chronic kidney disease with stage 5 chronic kidney disease or end stage renal disease: Secondary | ICD-10-CM | POA: Diagnosis present

## 2018-06-18 DIAGNOSIS — Z9049 Acquired absence of other specified parts of digestive tract: Secondary | ICD-10-CM

## 2018-06-18 DIAGNOSIS — I21A1 Myocardial infarction type 2: Principal | ICD-10-CM | POA: Diagnosis present

## 2018-06-18 DIAGNOSIS — Z8701 Personal history of pneumonia (recurrent): Secondary | ICD-10-CM

## 2018-06-18 DIAGNOSIS — R079 Chest pain, unspecified: Secondary | ICD-10-CM | POA: Diagnosis not present

## 2018-06-18 DIAGNOSIS — I214 Non-ST elevation (NSTEMI) myocardial infarction: Secondary | ICD-10-CM

## 2018-06-18 DIAGNOSIS — E785 Hyperlipidemia, unspecified: Secondary | ICD-10-CM | POA: Diagnosis present

## 2018-06-18 DIAGNOSIS — Z7982 Long term (current) use of aspirin: Secondary | ICD-10-CM

## 2018-06-18 DIAGNOSIS — K7460 Unspecified cirrhosis of liver: Secondary | ICD-10-CM | POA: Diagnosis present

## 2018-06-18 DIAGNOSIS — N2581 Secondary hyperparathyroidism of renal origin: Secondary | ICD-10-CM | POA: Diagnosis present

## 2018-06-18 DIAGNOSIS — Z8249 Family history of ischemic heart disease and other diseases of the circulatory system: Secondary | ICD-10-CM

## 2018-06-18 DIAGNOSIS — Z992 Dependence on renal dialysis: Secondary | ICD-10-CM

## 2018-06-18 DIAGNOSIS — Z79899 Other long term (current) drug therapy: Secondary | ICD-10-CM

## 2018-06-18 DIAGNOSIS — E1122 Type 2 diabetes mellitus with diabetic chronic kidney disease: Secondary | ICD-10-CM | POA: Diagnosis not present

## 2018-06-18 DIAGNOSIS — D631 Anemia in chronic kidney disease: Secondary | ICD-10-CM | POA: Diagnosis present

## 2018-06-18 DIAGNOSIS — Z87891 Personal history of nicotine dependence: Secondary | ICD-10-CM

## 2018-06-18 DIAGNOSIS — Z1159 Encounter for screening for other viral diseases: Secondary | ICD-10-CM

## 2018-06-18 DIAGNOSIS — E1129 Type 2 diabetes mellitus with other diabetic kidney complication: Secondary | ICD-10-CM | POA: Diagnosis present

## 2018-06-18 HISTORY — DX: Atherosclerotic heart disease of native coronary artery without angina pectoris: I25.10

## 2018-06-18 LAB — CBC
HCT: 30.6 % — ABNORMAL LOW (ref 36.0–46.0)
Hemoglobin: 9.8 g/dL — ABNORMAL LOW (ref 12.0–15.0)
MCH: 30.8 pg (ref 26.0–34.0)
MCHC: 32 g/dL (ref 30.0–36.0)
MCV: 96.2 fL (ref 80.0–100.0)
Platelets: 287 10*3/uL (ref 150–400)
RBC: 3.18 MIL/uL — ABNORMAL LOW (ref 3.87–5.11)
RDW: 15.2 % (ref 11.5–15.5)
WBC: 9 10*3/uL (ref 4.0–10.5)
nRBC: 0 % (ref 0.0–0.2)

## 2018-06-18 LAB — BASIC METABOLIC PANEL
Anion gap: 18 — ABNORMAL HIGH (ref 5–15)
BUN: 47 mg/dL — ABNORMAL HIGH (ref 6–20)
CO2: 22 mmol/L (ref 22–32)
Calcium: 7.6 mg/dL — ABNORMAL LOW (ref 8.9–10.3)
Chloride: 97 mmol/L — ABNORMAL LOW (ref 98–111)
Creatinine, Ser: 11.83 mg/dL — ABNORMAL HIGH (ref 0.44–1.00)
GFR calc Af Amer: 4 mL/min — ABNORMAL LOW (ref >60)
GFR calc non Af Amer: 3 mL/min — ABNORMAL LOW (ref >60)
Glucose, Bld: 153 mg/dL — ABNORMAL HIGH (ref 70–99)
Potassium: 3.1 mmol/L — ABNORMAL LOW (ref 3.5–5.1)
Sodium: 137 mmol/L (ref 135–145)

## 2018-06-18 LAB — TROPONIN I: Troponin I: 0.08 ng/mL (ref <0.03)

## 2018-06-18 MED ORDER — ACETAMINOPHEN 325 MG PO TABS
650.0000 mg | ORAL_TABLET | Freq: Four times a day (QID) | ORAL | Status: DC | PRN
  Administered 2018-06-20 – 2018-06-21 (×3): 650 mg via ORAL
  Filled 2018-06-18 (×3): qty 2

## 2018-06-18 MED ORDER — INSULIN ASPART 100 UNIT/ML ~~LOC~~ SOLN
0.0000 [IU] | SUBCUTANEOUS | Status: DC
  Administered 2018-06-19: 2 [IU] via SUBCUTANEOUS
  Administered 2018-06-19: 3 [IU] via SUBCUTANEOUS
  Administered 2018-06-19 (×2): 1 [IU] via SUBCUTANEOUS
  Administered 2018-06-20: 5 [IU] via SUBCUTANEOUS
  Administered 2018-06-20 – 2018-06-21 (×6): 1 [IU] via SUBCUTANEOUS

## 2018-06-18 MED ORDER — FERRIC CITRATE 1 GM 210 MG(FE) PO TABS
210.0000 mg | ORAL_TABLET | Freq: Three times a day (TID) | ORAL | Status: DC
  Administered 2018-06-19 – 2018-06-21 (×8): 210 mg via ORAL
  Filled 2018-06-18 (×10): qty 1

## 2018-06-18 MED ORDER — HYDROXYZINE HCL 10 MG PO TABS: 10.0000 mg | ORAL_TABLET | Freq: Two times a day (BID) | ORAL | Status: DC | PRN

## 2018-06-18 MED ORDER — SODIUM CHLORIDE 0.9% FLUSH
3.0000 mL | Freq: Once | INTRAVENOUS | Status: AC
  Administered 2018-06-19: 3 mL via INTRAVENOUS

## 2018-06-18 MED ORDER — FOLIC ACID 1 MG PO TABS
1.0000 mg | ORAL_TABLET | Freq: Every day | ORAL | Status: DC
  Administered 2018-06-19 – 2018-06-21 (×3): 1 mg via ORAL
  Filled 2018-06-18 (×3): qty 1

## 2018-06-18 MED ORDER — POTASSIUM CHLORIDE CRYS ER 20 MEQ PO TBCR: 40.0000 meq | EXTENDED_RELEASE_TABLET | Freq: Once | ORAL | Status: DC

## 2018-06-18 MED ORDER — ONDANSETRON HCL 4 MG/2ML IJ SOLN
4.0000 mg | Freq: Four times a day (QID) | INTRAMUSCULAR | Status: DC | PRN
  Administered 2018-06-20 – 2018-06-21 (×2): 4 mg via INTRAVENOUS
  Filled 2018-06-18 (×2): qty 2

## 2018-06-18 MED ORDER — ONDANSETRON 4 MG PO TBDP
8.0000 mg | ORAL_TABLET | Freq: Three times a day (TID) | ORAL | Status: DC | PRN
  Filled 2018-06-18: qty 2

## 2018-06-18 MED ORDER — HEPARIN SODIUM (PORCINE) 5000 UNIT/ML IJ SOLN
5000.0000 [IU] | Freq: Three times a day (TID) | INTRAMUSCULAR | Status: DC
  Administered 2018-06-19 (×2): 5000 [IU] via SUBCUTANEOUS
  Filled 2018-06-18 (×2): qty 1

## 2018-06-18 MED ORDER — CALCIUM ACETATE (PHOS BINDER) 667 MG PO CAPS
667.0000 mg | ORAL_CAPSULE | Freq: Three times a day (TID) | ORAL | Status: DC
  Administered 2018-06-19 – 2018-06-21 (×8): 667 mg via ORAL
  Filled 2018-06-18 (×8): qty 1

## 2018-06-18 MED ORDER — ASPIRIN EC 81 MG PO TBEC
81.0000 mg | DELAYED_RELEASE_TABLET | Freq: Every day | ORAL | Status: DC
  Administered 2018-06-19: 81 mg via ORAL
  Filled 2018-06-18: qty 1

## 2018-06-18 MED ORDER — ASPIRIN EC 81 MG PO TBEC
162.0000 mg | DELAYED_RELEASE_TABLET | Freq: Once | ORAL | Status: AC
  Administered 2018-06-18: 162 mg via ORAL
  Filled 2018-06-18: qty 2

## 2018-06-18 MED ORDER — ATORVASTATIN CALCIUM 80 MG PO TABS
80.0000 mg | ORAL_TABLET | Freq: Every day | ORAL | Status: DC
  Administered 2018-06-19 – 2018-06-21 (×3): 80 mg via ORAL
  Filled 2018-06-18 (×3): qty 1

## 2018-06-18 NOTE — ED Triage Notes (Signed)
Onset 10 minutes PTA mid chest pain radiating to right arm and nausea.  Pt reports this happened 2 nights ago.

## 2018-06-18 NOTE — ED Provider Notes (Signed)
[facility] EMERGENCY DEPARTMENT Provider Note   CSN: [id number] Arrival date & time: 06/18/18  2005    History   Chief Complaint Chief Complaint  Patient presents with  . Chest Pain    HPI Brenda Davis is a 53 y.o. female w/ a pmhx of ESRD (peritoneal dialysis), HLD, HTN, obesity who presents to the ED with an episode of chest pain. Chest pain lasted about 30 minutes. Was a dull heavy pressure that radiated to there right arm. During this episode she had nausea and diaphoresis. Took two baby aspirin just PTA. Improvement in pain. Had a similar episode on Thursday and then one about a month ago. Was not doing anything when the pain started.      HPI  Past Medical History:  Diagnosis Date  . Anemia   . CAD (coronary artery disease)    a. cath in 02/2018 showing moderate 3-vessel CAD with 45% mid-LADm 55% LCx and 65% RCA stenosis which was not significant by FFR  . ESRD on dialysis ([place])   . Gout   . HCAP (healthcare-associated pneumonia) 05/06/2016  . History of blood transfusion    "related to surgery"  . Hyperlipidemia   . Hypertension   . Morbid obesity ([place])   . Pain    LEFT SHOULDER PAIN - WAS SEEN AT AN URGENT CARE - GIVEN SLING FOR COMFORT AND TOLD ROM AS TOLERATED.  Marland Kitchen Palpitations 09/24/2016  . Type II diabetes mellitus ([place])    "gastric sleeve OR corrected this" (05/06/2016)    Patient Active Problem List   Diagnosis Date Noted  . Hypocalcemia 06/18/2018  . Chest pain, rule out acute myocardial infarction 06/18/2018  . Localized swelling of finger of left hand 06/01/2018  . Sepsis ([place]) 05/23/2018  . C. difficile diarrhea 05/08/2018  . Diarrhea of infectious origin 05/04/2018  . Hemodialysis-associated hypotension 04/28/2018  . Folate deficiency 03/08/2018  . CAD in native artery   . Hepatic cirrhosis ([place]) 11/24/2017  . ESRD on peritoneal dialysis ([place]) 08/25/2017  . DM (diabetes mellitus), type 2 with renal complications  ([place]) 88/91/6945  . Chronic heart failure with preserved ejection fraction ([place]) 06/22/2017  . Sensorineural hearing loss (SNHL), bilateral 06/29/2016  . Allergic rhinitis 06/27/2016  . Severe persistent asthma with exacerbation 05/12/2016  . Anemia of chronic disease 05/06/2016  . Routine general medical examination at a health care facility 12/05/2015  . Cervical cancer screening 12/05/2015  . Visit for screening mammogram 01/23/2015  . Gout due to renal impairment 09/13/2014  . Thiamine deficiency 12/02/2012  . Hypokalemia 11/28/2012  . S/P laparoscopic sleeve gastrectomy 11/28/2012  . B12 deficiency anemia 11/28/2012  . Morbid obesity with BMI of 40.0-44.9, adult ([place]) 07/22/2012  . Dyslipidemia, goal LDL below 70 04/09/2008  . CKD (chronic kidney disease) stage V requiring chronic dialysis (MWF) 02/08/2008    Past Surgical History:  Procedure Laterality Date  . AV FISTULA PLACEMENT Left 01/03/2015   Procedure: BRACHIAL CEPHALIC ARTERIOVENOUS  FISTULA CREATION LEFT ARM;  Surgeon: Angelia Mould, MD;  Location: [facility];  Service: Vascular;  Laterality: Left;  . CARPAL TUNNEL RELEASE Right   . [place]  . CHOLECYSTECTOMY  09/14/2017  . ESOPHAGOGASTRODUODENOSCOPY N/A 09/04/2012   Procedure: ESOPHAGOGASTRODUODENOSCOPY (EGD);  Surgeon: Shann Medal, MD;  Location: [facility];  Service: General;  Laterality: N/A;  PF  . ESOPHAGOGASTRODUODENOSCOPY (EGD) WITH ESOPHAGEAL DILATION N/A 09/29/2012   Procedure: ESOPHAGOGASTRODUODENOSCOPY (EGD) WITH ESOPHAGEAL DILATION;  Surgeon: Milus Banister,  MD;  [facility];  Service: Endoscopy;  Laterality: N/A;  . INSERTION OF DIALYSIS CATHETER N/A 01/03/2015   Procedure: INSERTION OF DIALYSIS CATHETER RIGHT INTERNAL JUGULAR;  Surgeon: Angelia Mould, MD;  Location: [facility];  Service: Vascular;  Laterality: N/A;  . LAPAROSCOPIC GASTRIC SLEEVE RESECTION N/A 07/19/2012   Procedure: LAPAROSCOPIC SLEEVE GASTRECTOMY  with EGD;  Surgeon: Madilyn Hook, DO;  [facility];  Service: General;  Laterality: N/A;  laparoscopic sleeve gastrectomy with EGD  . LEFT HEART CATH AND CORONARY ANGIOGRAPHY N/A 02/25/2018   Procedure: LEFT HEART CATH AND CORONARY ANGIOGRAPHY;  Surgeon: Leonie Man, MD;  Location: [facility];  Service: Cardiovascular;  Laterality: N/A;  . PERIPHERAL VASCULAR CATHETERIZATION Left 05/23/2015   Procedure: Nolon Stalls;  Surgeon: Conrad Leggett, MD;  Location: [facility];  Service: Cardiovascular;  Laterality: Left;  upper aRM  . PERIPHERAL VASCULAR CATHETERIZATION Left 05/23/2015   Procedure: Peripheral Vascular Balloon Angioplasty;  Surgeon: Conrad McGregor, MD;  Location: [facility];  Service: Cardiovascular;  Laterality: Left;  av fistula  . TUBAL LIGATION  1993  . UPPER GI ENDOSCOPY N/A 07/19/2012   Procedure: UPPER GI ENDOSCOPY;  Surgeon: Madilyn Hook, DO;  [facility];  Service: General;  Laterality: N/A;     OB History   No obstetric history on file.      Home Medications    Prior to Admission medications   Medication Sig Start Date End Date Taking? Authorizing Provider  acetaminophen (TYLENOL) 325 MG tablet Take 650 mg by mouth every 6 (six) hours as needed for mild pain.   Yes [provider]  aspirin EC 81 MG EC tablet Take 1 tablet (81 mg total) by mouth daily. 02/26/18  Yes Norval Morton, MD  atorvastatin (LIPITOR) 80 MG tablet Take 1 tablet (80 mg total) by mouth daily at 6 PM. 05/25/18  Yes Kilroy, Doreene Burke, PA-C  AURYXIA 1 GM 210 MG(Fe) tablet Take 210 mg by mouth 3 (three) times daily with meals.  04/07/17  Yes [provider]  calcium acetate (PHOSLO) 667 MG capsule Take 667 mg by mouth 3 (three) times daily with meals. 06/02/18  Yes [provider]  cinacalcet (SENSIPAR) 30 MG tablet Take 1 tablet (30 mg total) by mouth daily with supper. 04/28/18  Yes [name], MD  Cyanocobalamin (B-12 PO) Take 1 tablet by mouth daily.    Yes [provider]  folic acid (FOLVITE) 1 MG tablet Take 1 tablet (1 mg total) by mouth daily. 03/08/18  Yes [name], MD  gentamicin cream (GARAMYCIN) 0.1 % Apply 1 application topically See admin instructions. Apply to exit site daily as directed. 04/26/17  Yes [provider]  ondansetron (ZOFRAN ODT) 8 MG disintegrating tablet Take 1 tablet (8 mg total) by mouth every 8 (eight) hours as needed for nausea or vomiting. 04/09/18  Yes Jola Schmidt, MD  hydrOXYzine (ATARAX/VISTARIL) 10 MG tablet Take 10 mg by mouth 2 (two) times daily as needed for itching. 06/05/18   [provider]    Family History Family History  Problem Relation Age of Onset  . Hypertension Mother   . Mitral valve prolapse Mother   . Diabetes Father   . Arthritis Other   . Hyperlipidemia Other   . Cancer Neg Hx   . Heart disease Neg Hx   . Kidney disease Neg Hx   . Stroke Neg Hx     Social History  Social History   Tobacco Use  . Smoking status: Former Smoker    Packs/day: 1.00    Years: 16.00    Pack years: 16.00    Types: Cigarettes    Last attempt to quit: 2009    Years since quitting: 11.4  . Smokeless tobacco: Never Used  Substance Use Topics  . Alcohol use: Never    Frequency: Never  . Drug use: No     Allergies   Amlodipine; Clonidine derivatives; Doxycycline; and Welchol [colesevelam hcl]   Review of Systems Review of Systems  Constitutional: Negative for chills and fever.  Eyes: Negative for pain.  Respiratory: Positive for chest tightness. Negative for cough and shortness of breath.   Cardiovascular: Positive for chest pain. Negative for palpitations and leg swelling.  Gastrointestinal: Positive for nausea. Negative for abdominal pain, diarrhea and vomiting.  Genitourinary: Negative for dysuria.  Musculoskeletal: Negative for arthralgias and back pain.  Skin: Negative for color change and rash.  Neurological: Negative for headaches.  All other  systems reviewed and are negative.    Physical Exam Updated Vital Signs BP (!) 150/85 (BP Location: Right Arm)   Pulse 71   Temp 98 F (36.7 C) (Oral)   Resp 17   Ht 5\' 5"  (1.651 m)   Wt 115.9 kg   SpO2 100%   BMI 42.53 kg/m   Physical Exam Vitals signs and nursing note reviewed.  Constitutional:      General: She is not in acute distress.    Appearance: She is well-developed. She is not ill-appearing.  HENT:     Head: Normocephalic and atraumatic.  Eyes:     Conjunctiva/sclera: Conjunctivae normal.  Neck:     Musculoskeletal: Neck supple.  Cardiovascular:     Rate and Rhythm: Normal rate and regular rhythm.     Heart sounds: No murmur.  Pulmonary:     Effort: Pulmonary effort is normal. No respiratory distress.     Breath sounds: Normal breath sounds.  Abdominal:     Palpations: Abdomen is soft.     Tenderness: There is no abdominal tenderness.  Skin:    General: Skin is warm and dry.  Neurological:     Mental Status: She is alert.      ED Treatments / Results  Labs (all labs ordered are listed, but only abnormal results are displayed) Labs Reviewed  BASIC METABOLIC PANEL - Abnormal; Notable for the following components:      Result Value   Potassium 3.1 (*)    Chloride 97 (*)    Glucose, Bld 153 (*)    BUN 47 (*)    Creatinine, Ser 11.83 (*)    Calcium 7.6 (*)    GFR calc non Af Amer 3 (*)    GFR calc Af Amer 4 (*)    Anion gap 18 (*)    All other components within normal limits  CBC - Abnormal; Notable for the following components:   RBC 3.18 (*)    Hemoglobin 9.8 (*)    HCT 30.6 (*)    All other components within normal limits  TROPONIN I - Abnormal; Notable for the following components:   Troponin I 0.08 (*)    All other components within normal limits  TROPONIN I - Abnormal; Notable for the following components:   Troponin I 0.23 (*)    All other components within normal limits  TROPONIN I - Abnormal; Notable for the following components:    Troponin I 1.04 (*)  All other components within normal limits  BASIC METABOLIC PANEL - Abnormal; Notable for the following components:   Potassium 3.4 (*)    CO2 21 (*)    BUN 50 (*)    Creatinine, Ser 12.15 (*)    Calcium 7.6 (*)    GFR calc non Af Amer 3 (*)    GFR calc Af Amer 4 (*)    Anion gap 20 (*)    All other components within normal limits  BRAIN NATRIURETIC PEPTIDE - Abnormal; Notable for the following components:   B Natriuretic Peptide 110.2 (*)    All other components within normal limits  HEPATIC FUNCTION PANEL - Abnormal; Notable for the following components:   Total Protein 6.4 (*)    Albumin 2.9 (*)    All other components within normal limits  GLUCOSE, CAPILLARY - Abnormal; Notable for the following components:   Glucose-Capillary 123 (*)    All other components within normal limits  GLUCOSE, CAPILLARY - Abnormal; Notable for the following components:   Glucose-Capillary 151 (*)    All other components within normal limits  SARS CORONAVIRUS 2 (HOSPITAL ORDER, PERFORMED [facility])  GLUCOSE, CAPILLARY  GLUCOSE, CAPILLARY  TROPONIN I  HEPARIN LEVEL (UNFRACTIONATED)  I-STAT BETA HCG BLOOD, ED ([facility], [facility], [facility] ONLY)    EKG EKG Interpretation  Date/Time:  Saturday Jun 18 2018 22:22:22 EDT Ventricular Rate:  62 PR Interval:  156 QRS Duration: 90 QT Interval:  512 QTC Calculation: 520 R Axis:   48 Text Interpretation:  Sinus rhythm Prolonged QT interval Nonspecific T wave abnormality Confirmed by Lajean Saver [phone]) on 06/18/2018 10:25:22 PM   Radiology Dg Chest 2 View  Result Date: 06/18/2018 CLINICAL DATA:  Mid chest pain. EXAM: CHEST - 2 VIEW COMPARISON:  April 19, 2018 FINDINGS: The heart size and mediastinal contours are within normal limits. Both lungs are clear. The visualized skeletal structures are unremarkable. IMPRESSION: No active cardiopulmonary disease. Electronically Signed   By: Dorise Bullion III M.D   On: 06/18/2018  20:47    Procedures Procedures (including critical care time)  Medications Ordered in ED Medications  ferric citrate (AURYXIA) tablet 210 mg (210 mg Oral Given 06/19/18 0814)  calcium acetate (PHOSLO) capsule 667 mg (667 mg Oral Given 9/98/33 8250)  folic acid (FOLVITE) tablet 1 mg (1 mg Oral Given 06/19/18 0815)  atorvastatin (LIPITOR) tablet 80 mg (has no administration in time range)  acetaminophen (TYLENOL) tablet 650 mg (has no administration in time range)  hydrOXYzine (ATARAX/VISTARIL) tablet 10 mg (has no administration in time range)  ondansetron (ZOFRAN-ODT) disintegrating tablet 8 mg (has no administration in time range)  ondansetron (ZOFRAN) injection 4 mg (has no administration in time range)  insulin aspart (novoLOG) injection 0-9 Units (0 Units Subcutaneous Not Given 06/19/18 0800)  sodium chloride flush (NS) 0.9 % injection 3 mL (3 mLs Intravenous Given 06/19/18 1134)  sodium chloride flush (NS) 0.9 % injection 3 mL (has no administration in time range)  0.9 %  sodium chloride infusion (has no administration in time range)  aspirin chewable tablet 81 mg (has no administration in time range)  0.9 %  sodium chloride infusion (has no administration in time range)  heparin ADULT infusion 100 units/mL (25000 units/269mL sodium chloride 0.45%) (1,150 Units/hr Intravenous New Bag/Given 06/19/18 1133)  gentamicin cream (GARAMYCIN) 0.1 % 1 application (has no administration in time range)  dialysis solution 1.5% low-MG/low-CA dianeal solution (has no administration in time range)  dialysis solution 2.5%  low-MG/low-CA dianeal solution (has no administration in time range)  aspirin EC tablet 81 mg (has no administration in time range)  sodium chloride flush (NS) 0.9 % injection 3 mL (3 mLs Intravenous Given 06/19/18 0030)  aspirin EC tablet 162 mg (162 mg Oral Given 06/18/18 2136)     Initial Impression / Assessment and Plan / ED Course  I have reviewed the triage vital signs and the  nursing notes.  Pertinent labs & imaging results that were available during my care of the patient were reviewed by me and considered in my medical decision making (see chart for details).        Brenda Davis is a 53 y.o. female w/ a pmhx of ESRD (peritoneal dialysis), HLD, HTN, obesity who presents to the ED with an episode of chest pain that was associated with Nausea and diaphoresis.   On initial exam patient well appearing, not in acute distress. VSS. Physical exam as above.  Given history concern for ACS. Patient took 162 ASA PTA. Additional 162 ASA given in the ED.   EKG as above. Unchanged from prior. Troponin elevated at 0.08. Labs with Na 142, K 3.4, Ca 7.6, Alb 2.9, Hgb 9.8. COVID screen was negative. CXR was unremarkable.   Patient admitted to hospitalist for further workup and management.      Final Clinical Impressions(s) / ED Diagnoses   Final diagnoses:  NSTEMI (non-ST elevated myocardial infarction) [facility])    ED Discharge Orders    None       Doneta Public, MD 06/19/18 1200    Lajean Saver, MD 06/19/18 1520

## 2018-06-18 NOTE — ED Notes (Signed)
ED TO INPATIENT HANDOFF REPORT  ED Nurse Name and Phone #:  Patty [phone]  S Name/Age/Gender Brenda Davis 53 y.o. female Room/Bed: 017C/017C  Code Status   Code Status: Prior  Home/SNF/Other Home Patient oriented to: self, place, time and situation Is this baseline? Yes   Triage Complete: Triage complete  Chief Complaint CP SOB  Triage Note Onset 10 minutes PTA mid chest pain radiating to right arm and nausea.  Pt reports this happened 2 nights ago.     Allergies Allergies  Allergen Reactions  . Amlodipine Swelling  . Clonidine Derivatives Swelling    Limbs swell  . Doxycycline Nausea And Vomiting    "I threw up for 3 hours"  . Welchol [Colesevelam Hcl] Nausea Only    Level of Care/Admitting Diagnosis ED Disposition    ED Disposition Condition Comment   Admit  The patient appears reasonably stabilized for admission considering the current resources, flow, and capabilities available in the ED at this time, and I doubt any other [facility] requiring further screening and/or treatment in the ED prior to admission is  present.       B Medical/Surgery History Past Medical History:  Diagnosis Date  . Anemia   . Chronic kidney disease    STAGE 3 CHRONIC KIDNEY DISEASE SECONDARY TO DIABETIC GLOMERULOSCLEROSIS AND UNCONTROLLED HYPERTENSION - PER OFFICE NOTES DR[facility].  Marland Kitchen ESRD on dialysis [facility])    "MWF; [address] (05/06/2016)  . Gout   . HCAP (healthcare-associated pneumonia) 05/06/2016  . History of blood transfusion    "related to surgery"  . Hyperlipidemia   . Hypertension   . Morbid obesity ([place])   . Pain    LEFT SHOULDER PAIN - WAS SEEN AT AN URGENT CARE - GIVEN SLING FOR COMFORT AND TOLD ROM AS TOLERATED.  Marland Kitchen Palpitations 09/24/2016  . Type II diabetes mellitus ([place])    "gastric sleeve OR corrected this" (05/06/2016)   Past Surgical History:  Procedure Laterality Date  . AV FISTULA PLACEMENT Left 01/03/2015   Procedure:  BRACHIAL CEPHALIC ARTERIOVENOUS  FISTULA CREATION LEFT ARM;  Surgeon: Angelia Mould, MD;  Location: [facility];  Service: Vascular;  Laterality: Left;  . CARPAL TUNNEL RELEASE Right   . [place]  . CHOLECYSTECTOMY  09/14/2017  . ESOPHAGOGASTRODUODENOSCOPY N/A 09/04/2012   Procedure: ESOPHAGOGASTRODUODENOSCOPY (EGD);  Surgeon: Shann Medal, MD;  [facility];  Service: General;  Laterality: N/A;  PF  . ESOPHAGOGASTRODUODENOSCOPY (EGD) WITH ESOPHAGEAL DILATION N/A 09/29/2012   Procedure: ESOPHAGOGASTRODUODENOSCOPY (EGD) WITH ESOPHAGEAL DILATION;  Surgeon: Milus Banister, MD;  [facility];  Service: Endoscopy;  Laterality: N/A;  . INSERTION OF DIALYSIS CATHETER N/A 01/03/2015   Procedure: INSERTION OF DIALYSIS CATHETER RIGHT INTERNAL JUGULAR;  Surgeon: Angelia Mould, MD;  Location: [facility];  Service: Vascular;  Laterality: N/A;  . LAPAROSCOPIC GASTRIC SLEEVE RESECTION N/A 07/19/2012   Procedure: LAPAROSCOPIC SLEEVE GASTRECTOMY with EGD;  Surgeon: Madilyn Hook, DO;  [facility];  Service: General;  Laterality: N/A;  laparoscopic sleeve gastrectomy with EGD  . LEFT HEART CATH AND CORONARY ANGIOGRAPHY N/A 02/25/2018   Procedure: LEFT HEART CATH AND CORONARY ANGIOGRAPHY;  Surgeon: Leonie Man, MD;  Location: [facility];  Service: Cardiovascular;  Laterality: N/A;  . PERIPHERAL VASCULAR CATHETERIZATION Left 05/23/2015   Procedure: Nolon Stalls;  Surgeon: Conrad Honeyville, MD;  Location: [facility];  Service: Cardiovascular;  Laterality: Left;  upper aRM  . PERIPHERAL  VASCULAR CATHETERIZATION Left 05/23/2015   Procedure: Peripheral Vascular Balloon Angioplasty;  Surgeon: Conrad Northfield, MD;  Location: [facility];  Service: Cardiovascular;  Laterality: Left;  av fistula  . TUBAL LIGATION  1993  . UPPER GI ENDOSCOPY N/A 07/19/2012   Procedure: UPPER GI ENDOSCOPY;  Surgeon: Madilyn Hook, DO;  [facility];  Service: General;   Laterality: N/A;     A IV Location/Drains/Wounds Patient Lines/Drains/Airways Status   Active Line/Drains/Airways    Name:   Placement date:   Placement time:   Site:   Days:   Peripheral IV 06/18/18 Right Antecubital   06/18/18    2312    Antecubital   less than 1   Fistula / Graft Left Upper arm Arteriovenous fistula   01/03/15    1309    Upper arm   1262   Fistula / Graft Left Upper arm Arteriovenous fistula   -    -    Upper arm             Intake/Output Last 24 hours No intake or output data in the 24 hours ending 06/18/18 2316  Labs/Imaging Results for orders placed or performed during the hospital encounter of 06/18/18 (from the past 48 hour(s))  Basic metabolic panel     Status: Abnormal   Collection Time: 06/18/18  8:33 PM  Result Value Ref Range   Sodium 137 135 - 145 mmol/L   Potassium 3.1 (L) 3.5 - 5.1 mmol/L   Chloride 97 (L) 98 - 111 mmol/L   CO2 22 22 - 32 mmol/L   Glucose, Bld 153 (H) 70 - 99 mg/dL   BUN 47 (H) 6 - 20 mg/dL   Creatinine, Ser 11.83 (H) 0.44 - 1.00 mg/dL   Calcium 7.6 (L) 8.9 - 10.3 mg/dL   GFR calc non Af Amer 3 (L) >60 mL/min   GFR calc Af Amer 4 (L) >60 mL/min   Anion gap 18 (H) 5 - 15    Comment: Performed at [facility], [address] [place], [place] [address]  CBC     Status: Abnormal   Collection Time: 06/18/18  8:33 PM  Result Value Ref Range   WBC 9.0 4.0 - 10.5 K/uL   RBC 3.18 (L) 3.87 - 5.11 MIL/uL   Hemoglobin 9.8 (L) 12.0 - 15.0 g/dL   HCT 30.6 (L) 36.0 - 46.0 %   MCV 96.2 80.0 - 100.0 fL   MCH 30.8 26.0 - 34.0 pg   MCHC 32.0 30.0 - 36.0 g/dL   RDW 15.2 11.5 - 15.5 %   Platelets 287 150 - 400 K/uL   nRBC 0.0 0.0 - 0.2 %    Comment: Performed at [facility], [place] [address] [place], [place] [address]  Troponin I - ONCE - STAT     Status: Abnormal   Collection Time: 06/18/18  8:33 PM  Result Value Ref Range   Troponin I 0.08 (HH) <0.03 ng/mL    Comment: CRITICAL RESULT CALLED TO, READ BACK BY AND VERIFIED  WITH: Lousie Calico,P RN 06/18/2018 2218 JORDANS Performed at [facility], [place] [address] [place], [place] [address]    Dg Chest 2 View  Result Date: 06/18/2018 CLINICAL DATA:  Mid chest pain. EXAM: CHEST - 2 VIEW COMPARISON:  April 19, 2018 FINDINGS: The heart size and mediastinal contours are within normal limits. Both lungs are clear. The visualized skeletal structures are unremarkable. IMPRESSION: No active cardiopulmonary disease. Electronically  Signed   By: Dorise Bullion III M.D   On: 06/18/2018 20:47    Pending Labs Unresulted Labs (From admission, onward)    Start     Ordered   06/19/18 9449  Basic metabolic panel  Tomorrow morning,   R     06/18/18 2301   06/18/18 2304  Brain natriuretic peptide  Once,   R     06/18/18 2303   06/18/18 2302  Troponin I - Now Then Q6H  Now then every 6 hours,   R     06/18/18 2301   06/18/18 2238  SARS Coronavirus 2 (CEPHEID - Performed in [place] hospital lab), Hosp Order  (Asymptomatic Patients Labs)  Once,   R    Question:  Rule Out  Answer:  Yes   06/18/18 2237          Vitals/Pain Today's Vitals   06/18/18 2215 06/18/18 2245 06/18/18 2300 06/18/18 2313  BP: 134/68 (!) 148/85 (!) 152/88   Pulse:  70 70   Resp: 15 18 17    Temp:      TempSrc:      SpO2:  100% 100%   Weight:      Height:      PainSc:    0-No pain    Isolation Precautions No active isolations  Medications Medications  sodium chloride flush (NS) 0.9 % injection 3 mL (has no administration in time range)  aspirin EC tablet 162 mg (162 mg Oral Given 06/18/18 2136)    Mobility walks Low fall risk   Focused Assessments Cardiac Assessment Handoff:  Cardiac Rhythm: Normal sinus rhythm Lab Results  Component Value Date   CKTOTAL 146 12/31/2014   CKMB 3.1 12/31/2014   TROPONINI 0.08 (HH) 06/18/2018   Lab Results  Component Value Date   DDIMER 1.96 (H) 12/31/2014   Does the Patient currently have chest pain? No     R Recommendations: See  Admitting Provider Note  Report given to:   Additional Notes:

## 2018-06-18 NOTE — H&P (Signed)
History and Physical    Brenda Davis YTK:[id number] DOB: [birth year] DOA: 06/18/2018  PCP: [name], MD  Patient coming from: Home  I have personally briefly reviewed patient's old medical records in [place]  Chief Complaint: CP  HPI: Brenda Davis is a 53 y.o. female with medical history significant of ESRD on PD, DM2, CAD with moderate non-occlusive triple vessel disease on LHC in Feb this year after admission for CP with troponins in the 0.15 range.  Patient presents to the ED this evening with c/o CP.  Pain located in central chest with radiation to right arm.  No abd pain, no abd tenderness.  Does have some nausea but no vomiting during the CP episode.  CP has resolved at this time, lasted about 30 mins duration.  Had similar episode on Thurs, as well as one about 1 month ago.   ED Course: 2 baby ASA PTA.  CP resolved at this time.  Trop 0.08.  WBC nl.   Review of Systems: As per HPI otherwise 10 point review of systems negative.   Past Medical History:  Diagnosis Date  . Anemia   . Chronic kidney disease    STAGE 3 CHRONIC KIDNEY DISEASE SECONDARY TO DIABETIC GLOMERULOSCLEROSIS AND UNCONTROLLED HYPERTENSION - PER OFFICE NOTES DR. [facility].  Marland Kitchen ESRD on dialysis [facility])    "MWF; [address] (05/06/2016)  . Gout   . HCAP (healthcare-associated pneumonia) 05/06/2016  . History of blood transfusion    "related to surgery"  . Hyperlipidemia   . Hypertension   . Morbid obesity ([place])   . Pain    LEFT SHOULDER PAIN - WAS SEEN AT AN URGENT CARE - GIVEN SLING FOR COMFORT AND TOLD ROM AS TOLERATED.  Marland Kitchen Palpitations 09/24/2016  . Type II diabetes mellitus ([place])    "gastric sleeve OR corrected this" (05/06/2016)    Past Surgical History:  Procedure Laterality Date  . AV FISTULA PLACEMENT Left 01/03/2015   Procedure: BRACHIAL CEPHALIC ARTERIOVENOUS  FISTULA CREATION LEFT ARM;  Surgeon: Angelia Mould, MD;   Location: [facility];  Service: Vascular;  Laterality: Left;  . CARPAL TUNNEL RELEASE Right   . [place]  . CHOLECYSTECTOMY  09/14/2017  . ESOPHAGOGASTRODUODENOSCOPY N/A 09/04/2012   Procedure: ESOPHAGOGASTRODUODENOSCOPY (EGD);  Surgeon: Shann Medal, MD;  Location: [facility];  Service: General;  Laterality: N/A;  PF  . ESOPHAGOGASTRODUODENOSCOPY (EGD) WITH ESOPHAGEAL DILATION N/A 09/29/2012   Procedure: ESOPHAGOGASTRODUODENOSCOPY (EGD) WITH ESOPHAGEAL DILATION;  Surgeon: Milus Banister, MD;  Location: [facility];  Service: Endoscopy;  Laterality: N/A;  . INSERTION OF DIALYSIS CATHETER N/A 01/03/2015   Procedure: INSERTION OF DIALYSIS CATHETER RIGHT INTERNAL JUGULAR;  Surgeon: Angelia Mould, MD;  Location: [place];  Service: Vascular;  Laterality: N/A;  . LAPAROSCOPIC GASTRIC SLEEVE RESECTION N/A 07/19/2012   Procedure: LAPAROSCOPIC SLEEVE GASTRECTOMY with EGD;  Surgeon: Madilyn Hook, DO;  Location: [facility];  Service: General;  Laterality: N/A;  laparoscopic sleeve gastrectomy with EGD  . LEFT HEART CATH AND CORONARY ANGIOGRAPHY N/A 02/25/2018   Procedure: LEFT HEART CATH AND CORONARY ANGIOGRAPHY;  Surgeon: Leonie Man, MD;  Location: [place] [facility];  Service: Cardiovascular;  Laterality: N/A;  . PERIPHERAL VASCULAR CATHETERIZATION Left 05/23/2015   Procedure: Nolon Stalls;  Surgeon: Conrad St. Anne, MD;  Location: [place] [facility];  Service: Cardiovascular;  Laterality: Left;  upper aRM  . PERIPHERAL VASCULAR CATHETERIZATION Left 05/23/2015   Procedure: Peripheral Vascular  Balloon Angioplasty;  Surgeon: Conrad Kingston, MD;  Location: [facility] [facility];  Service: Cardiovascular;  Laterality: Left;  av fistula  . TUBAL LIGATION  1993  . UPPER GI ENDOSCOPY N/A 07/19/2012   Procedure: UPPER GI ENDOSCOPY;  Surgeon: Madilyn Hook, DO;  [facility];  Service: General;  Laterality: N/A;     reports that she quit smoking about 11 years ago. Her smoking use included  cigarettes. She has a 16.00 pack-year smoking history. She has never used smokeless tobacco. She reports that she does not drink alcohol or use drugs.  Allergies  Allergen Reactions  . Amlodipine Swelling  . Clonidine Derivatives Swelling    Limbs swell  . Doxycycline Nausea And Vomiting    "I threw up for 3 hours"  . Welchol [Colesevelam Hcl] Nausea Only    Family History  Problem Relation Age of Onset  . Hypertension Mother   . Mitral valve prolapse Mother   . Diabetes Father   . Arthritis Other   . Hyperlipidemia Other   . Cancer Neg Hx   . Heart disease Neg Hx   . Kidney disease Neg Hx   . Stroke Neg Hx      Prior to Admission medications   Medication Sig Start Date End Date Taking? Authorizing Provider  acetaminophen (TYLENOL) 325 MG tablet Take 650 mg by mouth every 6 (six) hours as needed for mild pain.   Yes [provider]  aspirin EC 81 MG EC tablet Take 1 tablet (81 mg total) by mouth daily. 02/26/18  Yes Norval Morton, MD  atorvastatin (LIPITOR) 80 MG tablet Take 1 tablet (80 mg total) by mouth daily at 6 PM. 05/25/18  Yes Kilroy, Doreene Burke, PA-C  AURYXIA 1 GM 210 MG(Fe) tablet Take 210 mg by mouth 3 (three) times daily with meals.  04/07/17  Yes [provider]  calcium acetate (PHOSLO) 667 MG capsule Take 667 mg by mouth 3 (three) times daily with meals. 06/02/18  Yes [provider]  cinacalcet (SENSIPAR) 30 MG tablet Take 1 tablet (30 mg total) by mouth daily with supper. 04/28/18  Yes [name], MD  Cyanocobalamin (B-12 PO) Take 1 tablet by mouth daily.   Yes [provider]  folic acid (FOLVITE) 1 MG tablet Take 1 tablet (1 mg total) by mouth daily. 03/08/18  Yes [name], MD  gentamicin cream (GARAMYCIN) 0.1 % Apply 1 application topically See admin instructions. Apply to exit site daily as directed. 04/26/17  Yes [provider]  ondansetron (ZOFRAN ODT) 8 MG disintegrating tablet Take 1 tablet (8 mg total) by  mouth every 8 (eight) hours as needed for nausea or vomiting. 04/09/18  Yes Jola Schmidt, MD  hydrOXYzine (ATARAX/VISTARIL) 10 MG tablet Take 10 mg by mouth 2 (two) times daily as needed for itching. 06/05/18   [provider]    Physical Exam: Vitals:   06/18/18 2200 06/18/18 2215 06/18/18 2245 06/18/18 2300  BP: 140/62 134/68 (!) 148/85 (!) 152/88  Pulse:   70 70  Resp: 13 15 18 17   Temp:      TempSrc:      SpO2:   100% 100%  Weight:      Height:        Constitutional: NAD, calm, comfortable Eyes: PERRL, lids and conjunctivae normal ENMT: Mucous membranes are moist. Posterior pharynx clear of any exudate or lesions.Normal dentition.  Neck: normal, supple, no masses, no thyromegaly Respiratory: clear to auscultation bilaterally,  no wheezing, no crackles. Normal respiratory effort. No accessory muscle use.  Cardiovascular: Regular rate and rhythm, no murmurs / rubs / gallops. No extremity edema. 2+ pedal pulses. No carotid bruits.  Abdomen: no tenderness, no masses palpated. No hepatosplenomegaly. Bowel sounds positive.  Musculoskeletal: no clubbing / cyanosis. No joint deformity upper and lower extremities. Good ROM, no contractures. Normal muscle tone.  Skin: no rashes, lesions, ulcers. No induration Neurologic: CN 2-12 grossly intact. Sensation intact, DTR normal. Strength 5/5 in all 4.  Psychiatric: Normal judgment and insight. Alert and oriented x 3. Normal mood.    Labs on Admission: I have personally reviewed following labs and imaging studies  CBC: Recent Labs  Lab 06/18/18 2033  WBC 9.0  HGB 9.8*  HCT 30.6*  MCV 96.2  PLT 962   Basic Metabolic Panel: Recent Labs  Lab 06/18/18 2033  NA 137  K 3.1*  CL 97*  CO2 22  GLUCOSE 153*  BUN 47*  CREATININE 11.83*  CALCIUM 7.6*   GFR: Estimated Creatinine Clearance: 6.8 mL/min (A) (by C-G formula based on SCr of 11.83 mg/dL (H)). Liver Function Tests: No results for input(s): AST, ALT, ALKPHOS, BILITOT,  PROT, ALBUMIN in the last 168 hours. No results for input(s): LIPASE, AMYLASE in the last 168 hours. No results for input(s): AMMONIA in the last 168 hours. Coagulation Profile: No results for input(s): INR, PROTIME in the last 168 hours. Cardiac Enzymes: Recent Labs  Lab 06/18/18 2033  TROPONINI 0.08*   BNP (last 3 results) No results for input(s): PROBNP in the last 8760 hours. HbA1C: No results for input(s): HGBA1C in the last 72 hours. CBG: No results for input(s): GLUCAP in the last 168 hours. Lipid Profile: No results for input(s): CHOL, HDL, LDLCALC, TRIG, CHOLHDL, LDLDIRECT in the last 72 hours. Thyroid Function Tests: No results for input(s): TSH, T4TOTAL, FREET4, T3FREE, THYROIDAB in the last 72 hours. Anemia Panel: No results for input(s): VITAMINB12, FOLATE, FERRITIN, TIBC, IRON, RETICCTPCT in the last 72 hours. Urine analysis:    Component Value Date/Time   COLORURINE COLORLESS (A) 04/18/2018 1900   APPEARANCEUR CLEAR 04/18/2018 1900   LABSPEC 1.005 04/18/2018 1900   PHURINE 8.0 04/18/2018 1900   GLUCOSEU >=500 (A) 04/18/2018 1900   GLUCOSEU >=1000 (A) 10/28/2017 0839   HGBUR SMALL (A) 04/18/2018 1900   HGBUR negative 09/17/2009 0000   BILIRUBINUR NEGATIVE 04/18/2018 1900   KETONESUR NEGATIVE 04/18/2018 1900   PROTEINUR 30 (A) 04/18/2018 1900   UROBILINOGEN 0.2 10/28/2017 0839   NITRITE NEGATIVE 04/18/2018 1900   LEUKOCYTESUR NEGATIVE 04/18/2018 1900    Radiological Exams on Admission: Dg Chest 2 View  Result Date: 06/18/2018 CLINICAL DATA:  Mid chest pain. EXAM: CHEST - 2 VIEW COMPARISON:  April 19, 2018 FINDINGS: The heart size and mediastinal contours are within normal limits. Both lungs are clear. The visualized skeletal structures are unremarkable. IMPRESSION: No active cardiopulmonary disease. Electronically Signed   By: Dorise Bullion III M.D   On: 06/18/2018 20:47    EKG: Independently reviewed.  Assessment/Plan Principal Problem:   Chest  pain, rule out acute myocardial infarction Active Problems:   Hypokalemia   ESRD on peritoneal dialysis ([place])   DM (diabetes mellitus), type 2 with renal complications (HCC)   Hepatic cirrhosis (HCC)   CAD in native artery   Hypocalcemia    1. CP r/o - 1. First trop 0.08, not clear if new elevation or if this is a chronic baseline, trops were around 0.15 in Feb this  year when they thought she was having NSTEMI and did LHC 2. Serial trops 3. Tele monitor 4. Currently CP free and asymptomatic, therefore: 1. Will hold off on NTG 2. And will hold off on heparin gtt 5. Continue ASA 6. NPO after MN 7. Message sent to P. Trent for cards eval in AM 2. CAD - 1. Moderate non-occlusive 3 vessel disease on LHC in Feb this year 3. ESRD on PD - 1. No reason to suspect SBP at this point 2. Contacted Dr. Jonnie Finner, nephro will see in AM 4. Hypokalemia - 1. Mild and asymptomatic at the moment, will defer giving potassium and let nephrology address 5. Hypocalcemia - 1. Checking LFTs to get albumin level 2. Will hold cinacalcet and continue sensipar for now 6. DM2 - 1. Looks like shes on no home meds 2. Will put on SSI sensitive scale Q4H for now  DVT prophylaxis: Heparin [place] Code Status: Full Family Communication: No family in room Disposition Plan: Home after admit Consults called: Message sent to P. Abagail Kitchens, also Dr. Jonnie Finner who replied that they will see in AM Admission status: Place in obs     , [place] [facility]  How to contact the [facility] Attending or Consulting provider [place] or covering provider during after hours [place], for this patient?  1. Check the care team in [facility] and look for a) attending/consulting [facility] provider listed and b) the [facility] team listed 2. Log into [email or web address]  Amion Physician Scheduling and messaging for groups and whole hospitals  On call and physician scheduling software for group practices, residents, hospitalists and other medical providers  for call, clinic, rotation and shift schedules. OnCall Enterprise is a hospital-wide system for scheduling doctors and paging doctors on call. EasyPlot is for scientific plotting and data analysis.  [email or web address]  and use [place]'s universal password to access. If you do not have the password, please contact the hospital operator.  3. Locate the [facility] provider you are looking for under [facility] and page to a number that you can be directly reached. 4. If you still have difficulty reaching the provider, please page the [facility] (Director on Call) for the Hospitalists listed on amion for assistance.  06/18/2018, 11:27 PM

## 2018-06-19 ENCOUNTER — Encounter (HOSPITAL_COMMUNITY): Payer: Self-pay | Admitting: Student

## 2018-06-19 ENCOUNTER — Telehealth: Payer: Self-pay | Admitting: *Deleted

## 2018-06-19 DIAGNOSIS — Z992 Dependence on renal dialysis: Secondary | ICD-10-CM | POA: Diagnosis not present

## 2018-06-19 DIAGNOSIS — Z794 Long term (current) use of insulin: Secondary | ICD-10-CM

## 2018-06-19 DIAGNOSIS — E876 Hypokalemia: Secondary | ICD-10-CM | POA: Diagnosis not present

## 2018-06-19 DIAGNOSIS — I120 Hypertensive chronic kidney disease with stage 5 chronic kidney disease or end stage renal disease: Secondary | ICD-10-CM | POA: Diagnosis not present

## 2018-06-19 DIAGNOSIS — Z9851 Tubal ligation status: Secondary | ICD-10-CM | POA: Diagnosis not present

## 2018-06-19 DIAGNOSIS — Z7982 Long term (current) use of aspirin: Secondary | ICD-10-CM | POA: Diagnosis not present

## 2018-06-19 DIAGNOSIS — I21A1 Myocardial infarction type 2: Secondary | ICD-10-CM | POA: Diagnosis not present

## 2018-06-19 DIAGNOSIS — Z87891 Personal history of nicotine dependence: Secondary | ICD-10-CM | POA: Diagnosis not present

## 2018-06-19 DIAGNOSIS — N186 End stage renal disease: Secondary | ICD-10-CM | POA: Diagnosis not present

## 2018-06-19 DIAGNOSIS — Z8249 Family history of ischemic heart disease and other diseases of the circulatory system: Secondary | ICD-10-CM | POA: Diagnosis not present

## 2018-06-19 DIAGNOSIS — R079 Chest pain, unspecified: Secondary | ICD-10-CM | POA: Diagnosis present

## 2018-06-19 DIAGNOSIS — E1122 Type 2 diabetes mellitus with diabetic chronic kidney disease: Secondary | ICD-10-CM | POA: Diagnosis not present

## 2018-06-19 DIAGNOSIS — D631 Anemia in chronic kidney disease: Secondary | ICD-10-CM | POA: Diagnosis not present

## 2018-06-19 DIAGNOSIS — E785 Hyperlipidemia, unspecified: Secondary | ICD-10-CM

## 2018-06-19 DIAGNOSIS — I214 Non-ST elevation (NSTEMI) myocardial infarction: Secondary | ICD-10-CM | POA: Diagnosis not present

## 2018-06-19 DIAGNOSIS — Z8701 Personal history of pneumonia (recurrent): Secondary | ICD-10-CM | POA: Diagnosis not present

## 2018-06-19 DIAGNOSIS — N2581 Secondary hyperparathyroidism of renal origin: Secondary | ICD-10-CM | POA: Diagnosis not present

## 2018-06-19 DIAGNOSIS — M109 Gout, unspecified: Secondary | ICD-10-CM | POA: Diagnosis not present

## 2018-06-19 DIAGNOSIS — Z79899 Other long term (current) drug therapy: Secondary | ICD-10-CM | POA: Diagnosis not present

## 2018-06-19 DIAGNOSIS — K7460 Unspecified cirrhosis of liver: Secondary | ICD-10-CM | POA: Diagnosis not present

## 2018-06-19 DIAGNOSIS — Z9049 Acquired absence of other specified parts of digestive tract: Secondary | ICD-10-CM | POA: Diagnosis not present

## 2018-06-19 DIAGNOSIS — I10 Essential (primary) hypertension: Secondary | ICD-10-CM

## 2018-06-19 DIAGNOSIS — Z1159 Encounter for screening for other viral diseases: Secondary | ICD-10-CM | POA: Diagnosis not present

## 2018-06-19 DIAGNOSIS — I2510 Atherosclerotic heart disease of native coronary artery without angina pectoris: Secondary | ICD-10-CM | POA: Diagnosis not present

## 2018-06-19 DIAGNOSIS — I25110 Atherosclerotic heart disease of native coronary artery with unstable angina pectoris: Secondary | ICD-10-CM | POA: Diagnosis not present

## 2018-06-19 DIAGNOSIS — Z888 Allergy status to other drugs, medicaments and biological substances status: Secondary | ICD-10-CM | POA: Diagnosis not present

## 2018-06-19 LAB — HEPATIC FUNCTION PANEL
ALT: 22 U/L (ref 0–44)
AST: 29 U/L (ref 15–41)
Albumin: 2.9 g/dL — ABNORMAL LOW (ref 3.5–5.0)
Alkaline Phosphatase: 98 U/L (ref 38–126)
Bilirubin, Direct: 0.2 mg/dL (ref 0.0–0.2)
Indirect Bilirubin: 0.5 mg/dL (ref 0.3–0.9)
Total Bilirubin: 0.7 mg/dL (ref 0.3–1.2)
Total Protein: 6.4 g/dL — ABNORMAL LOW (ref 6.5–8.1)

## 2018-06-19 LAB — HEPARIN LEVEL (UNFRACTIONATED): Heparin Unfractionated: 0.5 IU/mL (ref 0.30–0.70)

## 2018-06-19 LAB — BASIC METABOLIC PANEL
Anion gap: 20 — ABNORMAL HIGH (ref 5–15)
BUN: 50 mg/dL — ABNORMAL HIGH (ref 6–20)
CO2: 21 mmol/L — ABNORMAL LOW (ref 22–32)
Calcium: 7.6 mg/dL — ABNORMAL LOW (ref 8.9–10.3)
Chloride: 101 mmol/L (ref 98–111)
Creatinine, Ser: 12.15 mg/dL — ABNORMAL HIGH (ref 0.44–1.00)
GFR calc Af Amer: 4 mL/min — ABNORMAL LOW (ref >60)
GFR calc non Af Amer: 3 mL/min — ABNORMAL LOW (ref >60)
Glucose, Bld: 96 mg/dL (ref 70–99)
Potassium: 3.4 mmol/L — ABNORMAL LOW (ref 3.5–5.1)
Sodium: 142 mmol/L (ref 135–145)

## 2018-06-19 LAB — GLUCOSE, CAPILLARY
Glucose-Capillary: 123 mg/dL — ABNORMAL HIGH (ref 70–99)
Glucose-Capillary: 126 mg/dL — ABNORMAL HIGH (ref 70–99)
Glucose-Capillary: 151 mg/dL — ABNORMAL HIGH (ref 70–99)
Glucose-Capillary: 201 mg/dL — ABNORMAL HIGH (ref 70–99)
Glucose-Capillary: 85 mg/dL (ref 70–99)
Glucose-Capillary: 98 mg/dL (ref 70–99)

## 2018-06-19 LAB — TROPONIN I
Troponin I: 0.23 ng/mL (ref <0.03)
Troponin I: 1.04 ng/mL (ref <0.03)
Troponin I: 0.99 ng/mL (ref <0.03)

## 2018-06-19 LAB — BRAIN NATRIURETIC PEPTIDE: B Natriuretic Peptide: 110.2 pg/mL — ABNORMAL HIGH (ref 0.0–100.0)

## 2018-06-19 LAB — SARS CORONAVIRUS 2 BY RT PCR (HOSPITAL ORDER, PERFORMED IN ~~LOC~~ HOSPITAL LAB): SARS Coronavirus 2: NEGATIVE

## 2018-06-19 MED ORDER — GENTAMICIN SULFATE 0.1 % EX CREA
1.0000 "application " | TOPICAL_CREAM | Freq: Every day | CUTANEOUS | Status: DC
  Administered 2018-06-19 – 2018-06-21 (×4): 1 via TOPICAL
  Filled 2018-06-19: qty 15

## 2018-06-19 MED ORDER — DELFLEX-LC/1.5% DEXTROSE 344 MOSM/L IP SOLN: INTRAPERITONEAL | Status: DC

## 2018-06-19 MED ORDER — ASPIRIN EC 81 MG PO TBEC
81.0000 mg | DELAYED_RELEASE_TABLET | Freq: Every day | ORAL | Status: DC
  Administered 2018-06-21: 81 mg via ORAL
  Filled 2018-06-19: qty 1

## 2018-06-19 MED ORDER — SODIUM CHLORIDE 0.9% FLUSH
3.0000 mL | Freq: Two times a day (BID) | INTRAVENOUS | Status: DC
  Administered 2018-06-19 – 2018-06-21 (×4): 3 mL via INTRAVENOUS

## 2018-06-19 MED ORDER — DELFLEX-LC/2.5% DEXTROSE 394 MOSM/L IP SOLN: INTRAPERITONEAL | Status: DC

## 2018-06-19 MED ORDER — SODIUM CHLORIDE 0.9 % IV SOLN: 250.0000 mL | INTRAVENOUS | Status: DC | PRN

## 2018-06-19 MED ORDER — HEPARIN (PORCINE) 25000 UT/250ML-% IV SOLN
1100.0000 [IU]/h | INTRAVENOUS | Status: DC
  Administered 2018-06-19: 1150 [IU]/h via INTRAVENOUS
  Filled 2018-06-19: qty 250

## 2018-06-19 MED ORDER — SODIUM CHLORIDE 0.9 % IV SOLN
INTRAVENOUS | Status: DC
  Administered 2018-06-20: 07:00:00 via INTRAVENOUS

## 2018-06-19 MED ORDER — SODIUM CHLORIDE 0.9% FLUSH: 3.0000 mL | INTRAVENOUS | Status: DC | PRN

## 2018-06-19 MED ORDER — ASPIRIN 81 MG PO CHEW
81.0000 mg | CHEWABLE_TABLET | ORAL | Status: AC
  Administered 2018-06-20: 81 mg via ORAL
  Filled 2018-06-19: qty 1

## 2018-06-19 NOTE — Progress Notes (Signed)
[facility] PROGRESS NOTE  Brenda Davis TIR:[id number] DOB: 17-Aug-1965 DOA: 06/18/2018 PCP: [name], MD  Assessment/Plan: 1. NSTEMI, stable.  Currently chest pain-free, but troponin has continued to increase (0.23--1.04-- 0.99), atypical description of cardiac chest pain is (typical for female) hx of moderate 3v disease on recent LHC from February, concerning for NSTEMI.  Heparin infusion, cardiology plans for heart cath on 6/1, trend troponin, EKG as needed chest pain, continue aspirin, Lipitor. 2. ESRD on PD, stable.  Denies any abdominal pain.  Nephrology on board and assisting with volume status 3. Type 2 diabetes.  A1c 6.4%, on no home medications 4. Hyperlipidemia, Lipitor 80 mg 5. Hypertension, blood pressure range last 24 hours running high with systolics 676P and 950D, patient has history of intolerance to low-dose beta-blocker 6. Prolonged QT. 521 ms on EKG.  Repeat EKG on 6/1 per cardiology  Code Status: Full code Family Communication: No family at bedside (indicate person spoken with, relationship, and if by phone, the number) Disposition Plan: [place] on 6/1,heparin gtt, monitor for CP   Consultants:  Cardiology, nephrology  Procedures:  None  Antibiotics:  None (indicate start date, and stop date if known)  HPI/Subjective:  Brenda Davis is a 53 y.o. year old female with medical history significant for ESRD on PD, DM2, CAD with moderate non-occlusive triple vessel disease on LHC in Feb this year  who presented on 06/18/2018 with chest pain that initially began this past Thursday and again occurred the day prior to admission described as occurring in the center of her chest with radiation to her shoulders with associated nausea that was 10 out of 10 and eventually subsided after 45 minutes (previously she had taken aspirin with minimal relief)   and was found to have significantly elevated troponin concerning for NSTEMI.  "I feel so much  better." Denies any chest pain, no shortness of breath No nausea no vomiting  Objective: Vitals:   06/19/18 0016 06/19/18 0515  BP: (!) 167/96 (!) 150/85  Pulse: 66 71  Resp:  17  Temp: 97.6 F (36.4 C) 98 F (36.7 C)  SpO2: 100% 100%    Intake/Output Summary (Last 24 hours) at 06/19/2018 1152 Last data filed at 06/19/2018 0030 Gross per 24 hour  Intake 3 ml  Output -  Net 3 ml   Filed Weights   06/18/18 2135 06/19/18 0515  Weight: 111.1 kg 115.9 kg    Exam:   General: Middle age female, in no distress  Cardiovascular: Regular rate and rhythm, no edema  Respiratory: Normal respiratory effort on room air  Abdomen: Soft, nondistended, nontender, PD catheter site in place, no surrounding drainage or erythema  Musculoskeletal: Normal range of motion  Skin no rashes or lesions  Neurologic alert oriented x4, no appreciable focal deficits  Data Reviewed: Basic Metabolic Panel: Recent Labs  Lab 06/18/18 2033 06/19/18 0534  NA 137 142  K 3.1* 3.4*  CL 97* 101  CO2 22 21*  GLUCOSE 153* 96  BUN 47* 50*  CREATININE 11.83* 12.15*  CALCIUM 7.6* 7.6*   Liver Function Tests: Recent Labs  Lab 06/18/18 2314  AST 29  ALT 22  ALKPHOS 98  BILITOT 0.7  PROT 6.4*  ALBUMIN 2.9*   No results for input(s): LIPASE, AMYLASE in the last 168 hours. No results for input(s): AMMONIA in the last 168 hours. CBC: Recent Labs  Lab 06/18/18 2033  WBC 9.0  HGB 9.8*  HCT 30.6*  MCV 96.2  PLT 287  Cardiac Enzymes: Recent Labs  Lab 06/18/18 2033 06/18/18 2302 06/19/18 0534  TROPONINI 0.08* 0.23* 1.04*   BNP (last 3 results) Recent Labs    02/24/18 1027 06/18/18 2314  BNP 36.5 110.2*    ProBNP (last 3 results) No results for input(s): PROBNP in the last 8760 hours.  CBG: Recent Labs  Lab 06/19/18 0037 06/19/18 0512 06/19/18 0744 06/19/18 1109  GLUCAP 123* 98 85 151*    Recent Results (from the past 240 hour(s))  SARS Coronavirus 2 (CEPHEID -  Performed in [place] hospital lab), Hosp Order     Status: None   Collection Time: 06/18/18 11:14 PM  Result Value Ref Range Status   SARS Coronavirus 2 NEGATIVE NEGATIVE Final    Comment: (NOTE) If result is NEGATIVE SARS-CoV-2 target nucleic acids are NOT DETECTED. The SARS-CoV-2 RNA is generally detectable in upper and lower  respiratory specimens during the acute phase of infection. The lowest  concentration of SARS-CoV-2 viral copies this assay can detect is 250  copies / mL. A negative result does not preclude SARS-CoV-2 infection  and should not be used as the sole basis for treatment or other  patient management decisions.  A negative result may occur with  improper specimen collection / handling, submission of specimen other  than nasopharyngeal swab, presence of viral mutation(s) within the  areas targeted by this assay, and inadequate number of viral copies  (<250 copies / mL). A negative result must be combined with clinical  observations, patient history, and epidemiological information. If result is POSITIVE SARS-CoV-2 target nucleic acids are DETECTED. The SARS-CoV-2 RNA is generally detectable in upper and lower  respiratory specimens dur ing the acute phase of infection.  Positive  results are indicative of active infection with SARS-CoV-2.  Clinical  correlation with patient history and other diagnostic information is  necessary to determine patient infection status.  Positive results do  not rule out bacterial infection or co-infection with other viruses. If result is PRESUMPTIVE POSTIVE SARS-CoV-2 nucleic acids MAY BE PRESENT.   A presumptive positive result was obtained on the submitted specimen  and confirmed on repeat testing.  While 2019 novel coronavirus  (SARS-CoV-2) nucleic acids may be present in the submitted sample  additional confirmatory testing may be necessary for epidemiological  and / or clinical management purposes  to differentiate between   SARS-CoV-2 and other Sarbecovirus currently known to infect humans.  If clinically indicated additional testing with an alternate test  methodology [phone]) is advised. The SARS-CoV-2 RNA is generally  detectable in upper and lower respiratory sp ecimens during the acute  phase of infection. The expected result is Negative. Fact Sheet for Patients:  StrictlyIdeas.no Fact Sheet for Healthcare Providers: BankingDealers.co.za This test is not yet approved or cleared by the [place] FDA and has been authorized for detection and/or diagnosis of SARS-CoV-2 by FDA under an Emergency Use Authorization (EUA).  This EUA will remain in effect (meaning this test can be used) for the duration of the COVID-19 declaration under Section 564(b)(1) of the Act, 21 U.S.C. section 360bbb-3(b)(1), unless the authorization is terminated or revoked sooner. Performed at [facility], [place] [address] [place], [place] [address]      Studies: Dg Chest 2 View  Result Date: 06/18/2018 CLINICAL DATA:  Mid chest pain. EXAM: CHEST - 2 VIEW COMPARISON:  April 19, 2018 FINDINGS: The heart size and mediastinal contours are within normal limits. Both lungs are clear. The visualized skeletal structures are unremarkable.  IMPRESSION: No active cardiopulmonary disease. Electronically Signed   By: Dorise Bullion III M.D   On: 06/18/2018 20:47    Scheduled Meds: . Derrill Memo ON 06/20/2018] aspirin  81 mg Oral Pre-Cath  . [START ON 06/21/2018] aspirin EC  81 mg Oral Daily  . atorvastatin  80 mg Oral q1800  . calcium acetate  667 mg Oral TID WC  . ferric citrate  210 mg Oral TID WC  . folic acid  1 mg Oral Daily  . gentamicin cream  1 application Topical Daily  . insulin aspart  0-9 Units Subcutaneous Q4H  . sodium chloride flush  3 mL Intravenous Q12H   Continuous Infusions: . sodium chloride    . [START ON 06/20/2018] sodium chloride    . dialysis solution 1.5%  low-MG/low-CA    . dialysis solution 2.5% low-MG/low-CA    . heparin 1,150 Units/hr (06/19/18 1133)    Principal Problem:   Chest pain, rule out acute myocardial infarction Active Problems:   Hypokalemia   ESRD on peritoneal dialysis ([place])   DM (diabetes mellitus), type 2 with renal complications ([place])   Hepatic cirrhosis ([place])   CAD in native artery   Hypocalcemia      Desiree Hane  [facility]

## 2018-06-19 NOTE — Consult Note (Addendum)
[facility] Renal Consultation Note    Indication for Consultation:  Management of ESRD/hemodialysis, anemia, hypertension/volume, and secondary hyperparathyroidism. PCP:  HPI: RIANNA LUKES is a 53 y.o. female with ESRD on CCPD, CAD, HTN, hyperlipidemia, and T2DM who was admitted with chest pain, concern for NSTEMI.   Presented to ED yesterday 5/30 after developed sharp retrosternal pain with radiation to R shoulder, diaphoresis, and nausea which started while riding in the car with her husband. Had similar episode 2 days prior which woke her from sleep in early hours on Thurs with CP, dyspnea, nausea - took aspirin, improved after ~30 mins. In ED, vitals were ok, not hypoxic. Labs with Na 142, K 3.4, Ca 7.6, Alb 2.9, Hgb 9.8. COVID screen was negative. CXR was clear and EKG without acute ST changes. Initial Trop 0.08. Chest pain slowly improved and eventually resolved. She was admitted for cycling of cardiac enzymes. Unfortunately, her Trop has risen from 0.08 -> 0.23 -> 1.04 this morning. Cardiology consulted and plans for [facility] tomorrow morning.  At this point, she is asymptomatic. No CP/dyspnea. No nausea or vomiting. No fever.  Regarding her dialysis, does PD at home using cycler. Last treatment was Friday night 5/29. No abd pain. Finished course of IP Ceftazidime 5/20 for peritonitis, no recurrent symptoms.   Past Medical History:  Diagnosis Date  . Anemia   . CAD (coronary artery disease)    a. cath in 02/2018 showing moderate 3-vessel CAD with 45% mid-LADm 55% LCx and 65% RCA stenosis which was not significant by FFR  . ESRD on dialysis ([place])   . Gout   . HCAP (healthcare-associated pneumonia) 05/06/2016  . History of blood transfusion    "related to surgery"  . Hyperlipidemia   . Hypertension   . Morbid obesity ([place])   . Pain    LEFT SHOULDER PAIN - WAS SEEN AT AN URGENT CARE - GIVEN SLING FOR COMFORT AND TOLD ROM AS TOLERATED.  Marland Kitchen Palpitations 09/24/2016   . Type II diabetes mellitus ([place])    "gastric sleeve OR corrected this" (05/06/2016)   Past Surgical History:  Procedure Laterality Date  . AV FISTULA PLACEMENT Left 01/03/2015   Procedure: BRACHIAL CEPHALIC ARTERIOVENOUS  FISTULA CREATION LEFT ARM;  Surgeon: Angelia Mould, MD;  Location: [place];  Service: Vascular;  Laterality: Left;  . CARPAL TUNNEL RELEASE Right   . [place]  . CHOLECYSTECTOMY  09/14/2017  . ESOPHAGOGASTRODUODENOSCOPY N/A 09/04/2012   Procedure: ESOPHAGOGASTRODUODENOSCOPY (EGD);  Surgeon: Shann Medal, MD;  Location: [facility];  Service: General;  Laterality: N/A;  PF  . ESOPHAGOGASTRODUODENOSCOPY (EGD) WITH ESOPHAGEAL DILATION N/A 09/29/2012   Procedure: ESOPHAGOGASTRODUODENOSCOPY (EGD) WITH ESOPHAGEAL DILATION;  Surgeon: Milus Banister, MD;  Location: [facility];  Service: Endoscopy;  Laterality: N/A;  . INSERTION OF DIALYSIS CATHETER N/A 01/03/2015   Procedure: INSERTION OF DIALYSIS CATHETER RIGHT INTERNAL JUGULAR;  Surgeon: Angelia Mould, MD;  Location: [facility];  Service: Vascular;  Laterality: N/A;  . LAPAROSCOPIC GASTRIC SLEEVE RESECTION N/A 07/19/2012   Procedure: LAPAROSCOPIC SLEEVE GASTRECTOMY with EGD;  Surgeon: Madilyn Hook, DO;  Location: [facility];  Service: General;  Laterality: N/A;  laparoscopic sleeve gastrectomy with EGD  . LEFT HEART CATH AND CORONARY ANGIOGRAPHY N/A 02/25/2018   Procedure: LEFT HEART CATH AND CORONARY ANGIOGRAPHY;  Surgeon: Leonie Man, MD;  Location: [facility];  Service: Cardiovascular;  Laterality: N/A;  . PERIPHERAL VASCULAR CATHETERIZATION Left 05/23/2015   Procedure: Nolon Stalls;  Surgeon: Conrad [place], MD;  Location: [facility];  Service: Cardiovascular;  Laterality: Left;  upper aRM  . PERIPHERAL VASCULAR CATHETERIZATION Left 05/23/2015   Procedure: Peripheral Vascular Balloon Angioplasty;  Surgeon: Conrad [place], MD;  Location: [facility] [facility];  Service: Cardiovascular;   Laterality: Left;  av fistula  . TUBAL LIGATION  1993  . UPPER GI ENDOSCOPY N/A 07/19/2012   Procedure: UPPER GI ENDOSCOPY;  Surgeon: Madilyn Hook, DO;  Location: [facility];  Service: General;  Laterality: N/A;   Family History  Problem Relation Age of Onset  . Hypertension Mother   . Mitral valve prolapse Mother   . Diabetes Father   . Arthritis Other   . Hyperlipidemia Other   . Cancer Neg Hx   . Heart disease Neg Hx   . Kidney disease Neg Hx   . Stroke Neg Hx    Social History:  reports that she quit smoking about 11 years ago. Her smoking use included cigarettes. She has a 16.00 pack-year smoking history. She has never used smokeless tobacco. She reports that she does not drink alcohol or use drugs.  ROS: As per HPI otherwise negative.  Physical Exam: Vitals:   06/18/18 2245 06/18/18 2300 06/19/18 0016 06/19/18 0515  BP: (!) 148/85 (!) 152/88 (!) 167/96 (!) 150/85  Pulse: 70 70 66 71  Resp: 18 17  17   Temp:   97.6 F (36.4 C) 98 F (36.7 C)  TempSrc:   Oral Oral  SpO2: 100% 100% 100% 100%  Weight:    115.9 kg  Height:         General: Well developed, well nourished, in no acute distress. Head: Normocephalic, atraumatic, sclera non-icteric, mucus membranes are moist. Neck: Supple without lymphadenopathy/masses. JVD not elevated. Lungs: Clear bilaterally to auscultation without wheezes, rales, or rhonchi. Breathing is unlabored. Heart: RRR with normal S1, S2. No murmurs, rubs, or gallops appreciated. Abdomen: Soft, non-distended with normoactive bowel sounds. PD cath in RLQ; no tenderness. Musculoskeletal:  Strength and tone appear normal for age. Lower extremities: No edema or ischemic changes, no open wounds. Neuro: Alert and oriented X 3. Moves all extremities spontaneously. Psych:  Responds to questions appropriately with a normal affect. Dialysis Access: PD cath in RLQ, also has functional LUE AVF + bruit  Allergies  Allergen Reactions  . Amlodipine Swelling  .  Clonidine Derivatives Swelling    Limbs swell  . Doxycycline Nausea And Vomiting    "I threw up for 3 hours"  . Welchol [Colesevelam Hcl] Nausea Only   Prior to Admission medications   Medication Sig Start Date End Date Taking? Authorizing Provider  acetaminophen (TYLENOL) 325 MG tablet Take 650 mg by mouth every 6 (six) hours as needed for mild pain.   Yes [provider]  aspirin EC 81 MG EC tablet Take 1 tablet (81 mg total) by mouth daily. 02/26/18  Yes Norval Morton, MD  atorvastatin (LIPITOR) 80 MG tablet Take 1 tablet (80 mg total) by mouth daily at 6 PM. 05/25/18  Yes Kilroy, Doreene Burke, PA-C  AURYXIA 1 GM 210 MG(Fe) tablet Take 210 mg by mouth 3 (three) times daily with meals.  04/07/17  Yes [provider]  calcium acetate (PHOSLO) 667 MG capsule Take 667 mg by mouth 3 (three) times daily with meals. 06/02/18  Yes [provider]  cinacalcet (SENSIPAR) 30 MG tablet Take 1 tablet (30 mg total) by mouth daily with supper. 04/28/18  Yes [name],  MD  Cyanocobalamin (B-12 PO) Take 1 tablet by mouth daily.   Yes [provider]  folic acid (FOLVITE) 1 MG tablet Take 1 tablet (1 mg total) by mouth daily. 03/08/18  Yes [name], MD  gentamicin cream (GARAMYCIN) 0.1 % Apply 1 application topically See admin instructions. Apply to exit site daily as directed. 04/26/17  Yes [provider]  ondansetron (ZOFRAN ODT) 8 MG disintegrating tablet Take 1 tablet (8 mg total) by mouth every 8 (eight) hours as needed for nausea or vomiting. 04/09/18  Yes Jola Schmidt, MD  hydrOXYzine (ATARAX/VISTARIL) 10 MG tablet Take 10 mg by mouth 2 (two) times daily as needed for itching. 06/05/18   [provider]   Current Facility-Administered Medications  Medication Dose Route Frequency Provider Last Rate Last Dose  . acetaminophen (TYLENOL) tablet 650 mg  650 mg Oral Q6H PRN Etta Quill, DO      . aspirin EC tablet 81 mg  81 mg Oral Daily Jennette Kettle M, DO   81 mg at 06/19/18 0815  . atorvastatin (LIPITOR) tablet 80 mg  80 mg Oral q1800 Etta Quill, DO      . calcium acetate (PHOSLO) capsule 667 mg  667 mg Oral TID WC Etta Quill, DO   667 mg at 06/19/18 8921  . ferric citrate (AURYXIA) tablet 210 mg  210 mg Oral TID WC Jennette Kettle M, DO   210 mg at 06/19/18 1941  . folic acid (FOLVITE) tablet 1 mg  1 mg Oral Daily Jennette Kettle M, DO   1 mg at 06/19/18 0815  . heparin ADULT infusion 100 units/mL (25000 units/274m sodium chloride 0.45%)  1,150 Units/hr Intravenous Continuous Carney, JGay Filler RPH      . hydrOXYzine (ATARAX/VISTARIL) tablet 10 mg  10 mg Oral BID PRN GEtta Quill DO      . insulin aspart (novoLOG) injection 0-9 Units  0-9 Units Subcutaneous Q4H GEtta Quill DO   1 Units at 06/19/18 0115  . ondansetron (ZOFRAN) injection 4 mg  4 mg Intravenous Q6H PRN GEtta Quill DO      . ondansetron (ZOFRAN-ODT) disintegrating tablet 8 mg  8 mg Oral Q8H PRN GEtta Quill DO      . sodium chloride flush (NS) 0.9 % injection 3 mL  3 mL Intravenous Q12H [facility] [place]      Labs: Basic Metabolic Panel: Recent Labs  Lab 06/18/18 2033 06/19/18 0534  NA 137 142  K 3.1* 3.4*  CL 97* 101  CO2 22 21*  GLUCOSE 153* 96  BUN 47* 50*  CREATININE 11.83* 12.15*  CALCIUM 7.6* 7.6*   Liver Function Tests: Recent Labs  Lab 06/18/18 2314  AST 29  ALT 22  ALKPHOS 98  BILITOT 0.7  PROT 6.4*  ALBUMIN 2.9*   CBC: Recent Labs  Lab 06/18/18 2033  WBC 9.0  HGB 9.8*  HCT 30.6*  MCV 96.2  PLT 287   Cardiac Enzymes: Recent Labs  Lab 06/18/18 2033 06/18/18 2302 06/19/18 0534  TROPONINI 0.08* 0.23* 1.04*   CBG: Recent Labs  Lab 06/19/18 0037 06/19/18 0512 06/19/18 0744  GLUCAP 123* 98 85   Studies/Results: Dg Chest 2 View  Result Date: 06/18/2018 CLINICAL DATA:  Mid chest pain. EXAM: CHEST - 2 VIEW COMPARISON:  April 19, 2018 FINDINGS: The heart size and mediastinal contours  are within normal limits. Both lungs are clear. The visualized skeletal structures are unremarkable. IMPRESSION:  No active cardiopulmonary disease. Electronically Signed   By: Dorise Bullion III M.D   On: 06/18/2018 20:47    Dialysis Orders:  CCPD managed thru [facility] unit 6 cycles, 2.5L fill volume, 1:30h dwell, no last fill. Typically uses 2-2.5% and 1-1.5% dextrose bags.  Assessment/Plan: 1.  NSTEMI: Known CAD based on last [facility] 02/2018 - no stents needed at that time (mild-mod 3V CAD w/ diffuse calcification). No chest pain currently. Cardiology consulted - plan is for heart cath in AM. 2.  ESRD: CCPD - will need to start PD early tonight to assure off in time for heart procedure. 3.  Hypertension/volume: BP borderline - no edema/CXR clear. Will use typically home 1.5%/2.5% combination. 4.  Anemia: Hgb 9.8 - will need to call PD office tomorrow and clarify when last given ESA. 5.  Metabolic bone disease: Corr Ca ok, continue home binders. 6.  Nutrition: Alb low in setting of PD and recent (now resolved) infection - adding pro-stat 7.  T2DM  Veneta Penton, Hershal Coria 06/19/2018, 11:09 AM  [facility] Pager: [phone]  Pt seen, examined and agree w A/P as above.  Kelly Splinter  MD 06/19/2018, 12:03 PM

## 2018-06-19 NOTE — Progress Notes (Signed)
[facility] for IV heparin Indication: chest pain/ACS  Allergies  Allergen Reactions  . Amlodipine Swelling  . Clonidine Derivatives Swelling    Limbs swell  . Doxycycline Nausea And Vomiting    "I threw up for 3 hours"  . Welchol [Colesevelam Hcl] Nausea Only    Patient Measurements: Height: 5\' 5"  (165.1 cm) Weight: 259 lb 14.8 oz (117.9 kg) IBW/kg (Calculated) : 57 Heparin Dosing Weight: 83 kg  Vital Signs: Temp: 98 F (36.7 C) (05/31 1830) Temp Source: Oral (05/31 1830) BP: 142/85 (05/31 1830) Pulse Rate: 68 (05/31 1830)  Labs: Recent Labs    06/18/18 2033 06/18/18 2302 06/19/18 0534 06/19/18 1109 06/19/18 1736  HGB 9.8*  --   --   --   --   HCT 30.6*  --   --   --   --   PLT 287  --   --   --   --   HEPARINUNFRC  --   --   --   --  0.50  CREATININE 11.83*  --  12.15*  --   --   TROPONINI 0.08* 0.23* 1.04* 0.99*  --     Estimated Creatinine Clearance: 6.9 mL/min (A) (by C-G formula based on SCr of 12.15 mg/dL (H)).  Medications:  Infusions:  . sodium chloride    . [START ON 06/20/2018] sodium chloride    . dialysis solution 1.5% low-MG/low-CA    . dialysis solution 2.5% low-MG/low-CA    . heparin 1,150 Units/hr (06/19/18 1133)    Assessment: 53 yo female with hx ESRD on PD, admitted with chest pain and pharmacy asked to begin IV heparin.  Baseline Hgb slightly low, platelet count okay.  Known CAD from previous cath.  Initial heparin level is therapeutic. No bleeding noted.   Goal of Therapy:  Heparin level 0.3-0.7 units/ml Monitor platelets by anticoagulation protocol: Yes   Plan:  Continue heparin gtt 1150 units/hr Confirm dosing with AM heparin level  Salome Arnt, PharmD, BCPS Please see AMION for all pharmacy numbers 06/19/2018 6:55 PM

## 2018-06-19 NOTE — Progress Notes (Signed)
ANTICOAGULATION CONSULT NOTE - Initial Consult  Pharmacy Consult for IV heparin Indication: chest pain/ACS  Allergies  Allergen Reactions  . Amlodipine Swelling  . Clonidine Derivatives Swelling    Limbs swell  . Doxycycline Nausea And Vomiting    "I threw up for 3 hours"  . Welchol [Colesevelam Hcl] Nausea Only    Patient Measurements: Height: 5\' 5"  (165.1 cm) Weight: 255 lb 9.6 oz (115.9 kg) IBW/kg (Calculated) : 57 Heparin Dosing Weight: 83 kg  Vital Signs: Temp: 98 F (36.7 C) (05/31 0515) Temp Source: Oral (05/31 0515) BP: 150/85 (05/31 0515) Pulse Rate: 71 (05/31 0515)  Labs: Recent Labs    06/18/18 2033 06/18/18 2302 06/19/18 0534  HGB 9.8*  --   --   HCT 30.6*  --   --   PLT 287  --   --   CREATININE 11.83*  --  12.15*  TROPONINI 0.08* 0.23* 1.04*    Estimated Creatinine Clearance: 6.8 mL/min (A) (by C-G formula based on SCr of 12.15 mg/dL (H)).   Medical History: Past Medical History:  Diagnosis Date  . Anemia   . CAD (coronary artery disease)    a. cath in 02/2018 showing moderate 3-vessel CAD with 45% mid-LADm 55% LCx and 65% RCA stenosis which was not significant by FFR  . ESRD on dialysis ([place])   . Gout   . HCAP (healthcare-associated pneumonia) 05/06/2016  . History of blood transfusion    "related to surgery"  . Hyperlipidemia   . Hypertension   . Morbid obesity ([place])   . Pain    LEFT SHOULDER PAIN - WAS SEEN AT AN URGENT CARE - GIVEN SLING FOR COMFORT AND TOLD ROM AS TOLERATED.  Marland Kitchen Palpitations 09/24/2016  . Type II diabetes mellitus ([place])    "gastric sleeve OR corrected this" (05/06/2016)    Medications:  Infusions:    Assessment: 53 yo female with hx ESRD on PD, admitted with chest pain and pharmacy asked to begin IV heparin.  Baseline Hgb slightly low, platelet count okay.  Known CAD from previous cath.  Received sq heparin dose of 5000 units about an hour ago.  Goal of Therapy:  Heparin level 0.3-0.7 units/ml Monitor  platelets by anticoagulation protocol: Yes   Plan:  1. Start IV heparin at rate of 1150 units/hr. 2. Check heparin level 8 hrs after heparin starts. 3. Daily heparin level and CBC. 4. F/u plans for heparin after cath tomorrow.  Marguerite Olea, [facility] Clinical Pharmacist Phone [phone]  06/19/2018 9:37 AM

## 2018-06-19 NOTE — Consult Note (Addendum)
Cardiology Consult    Patient ID: Brenda Davis; [id number]; Jul 04, 1965   Admit date: 06/18/2018 Date of Consult: 06/19/2018  Primary Care Provider: [name], MD Primary Cardiologist: Skeet Latch, MD   Patient Profile    Brenda Davis is a 53 y.o. female with past medical history of CAD (cath in 02/2018 showing moderate 3-vessel CAD with 45% mid-LAD, 55% LCx and 65% RCA stenosis which was not significant by FFR), HTN, HLD, Type 2 DM, and ESRD (on peritoneal dialysis) who is being seen today for the evaluation of chest pain at the request of Dr. Alcario Drought.   History of Present Illness    Ms. Brenda Davis most recently had a telehealth visit with Kerin Ransom, PA-C on 05/23/2018 and denied any recent chest pain or dyspnea one exertion at that time. Her Atorvastatin had been discontinued during a previous admission and this was resumed with her remaining on ASA 48m daily. Had been unable to tolerate low-dose BB previously due to hypotension.   [facility] of chest pain, right arm pain, and nausea. Reports being in her usual state of health until this past week when she experienced an episode of chest pain Wednesday night which occurred while sitting down. She took 2 baby aspirin with resolution of her symptoms within 30 minutes. Yesterday, while riding in the car, she developed a pressure along her left pectoral region which radiated into her right shoulder with associated nausea and diaphoresis. Symptoms again resolved when she took ASA. She denies any episodes of chest pain for the past few months until this week. No recent dyspnea on exertion, orthopnea, PND, lower extremity edema, or palpitations.   Initial labs show WBC 9.0, Hgb 9.8, platelets 287, Na+ 137, K+ 3.1, and creatinine 11.83. BNP 110. COVID negative. Initial troponin 0.08 with repeat values of 0.23 and 1.04 (values peaked at 0.15 during admission in  02/2018). AM value pending. CXR showed no active cardiopulmonary disease. Initial EKG showing NSR, HR 72, with no acute ST abnormalities. Prolonged QT at 521 ms with similar results by calculated corrected QT.   She denies any recurrent episodes of chest pain since admission.     Past Medical History:  Diagnosis Date   Anemia    CAD (coronary artery disease)    a. cath in 02/2018 showing moderate 3-vessel CAD with 45% mid-LADm 55% LCx and 65% RCA stenosis which was not significant by FFR   [place]    Gout    HCAP (healthcare-associated pneumonia) 05/06/2016   History of blood transfusion    "related to surgery"   Hyperlipidemia    Hypertension    [place]    Pain    LEFT SHOULDER PAIN - WAS SEEN AT AN URGENT CARE - GIVEN SLING FOR COMFORT AND TOLD ROM AS TOLERATED.   Palpitations 09/24/2016   Type II diabetes mellitus ([place]    "gastric sleeve OR corrected this" (05/06/2016)    Past Surgical History:  Procedure Laterality Date   AV FISTULA PLACEMENT Left 01/03/2015   Procedure: BRACHIAL CEPHALIC ARTERIOVENOUS  FISTULA CREATION LEFT ARM;  Surgeon: CAngelia Mould MD;  [facility]  Service: Vascular;  Laterality: Left;   CARPAL TUNNEL RELEASE Right    CBright 09/14/2017   ESOPHAGOGASTRODUODENOSCOPY N/A 09/04/2012   Procedure: ESOPHAGOGASTRODUODENOSCOPY (EGD);  Surgeon: DShann Medal MD;  [facility];  Service: General;  Laterality: N/A;  PF   ESOPHAGOGASTRODUODENOSCOPY (EGD) WITH ESOPHAGEAL DILATION N/A 09/29/2012   Procedure: ESOPHAGOGASTRODUODENOSCOPY (EGD) WITH ESOPHAGEAL DILATION;  Surgeon: Milus Banister, MD;  [facility];  Service: Endoscopy;  Laterality: N/A;   INSERTION OF DIALYSIS CATHETER N/A 01/03/2015   Procedure: INSERTION OF DIALYSIS CATHETER RIGHT INTERNAL JUGULAR;  Surgeon: Angelia Mould, MD;  Location: [facility];  Service: Vascular;  Laterality:  N/A;   LAPAROSCOPIC GASTRIC SLEEVE RESECTION N/A 07/19/2012   Procedure: LAPAROSCOPIC SLEEVE GASTRECTOMY with EGD;  Surgeon: Madilyn Hook, DO;  [facility];  Service: General;  Laterality: N/A;  laparoscopic sleeve gastrectomy with EGD   LEFT HEART CATH AND CORONARY ANGIOGRAPHY N/A 02/25/2018   Procedure: LEFT HEART CATH AND CORONARY ANGIOGRAPHY;  Surgeon: Leonie Man, MD;  Location: [place] [facility];  Service: Cardiovascular;  Laterality: N/A;   PERIPHERAL VASCULAR CATHETERIZATION Left 05/23/2015   Procedure: Nolon Stalls;  Surgeon: Conrad Baxley, MD;  Location: [place] [facility];  Service: Cardiovascular;  Laterality: Left;  upper aRM   PERIPHERAL VASCULAR CATHETERIZATION Left 05/23/2015   Procedure: Peripheral Vascular Balloon Angioplasty;  Surgeon: Conrad Ionia, MD;  Location: [place] [facility];  Service: Cardiovascular;  Laterality: Left;  av fistula   TUBAL LIGATION  1993   UPPER GI ENDOSCOPY N/A 07/19/2012   Procedure: UPPER GI ENDOSCOPY;  Surgeon: Madilyn Hook, DO;  Location: [facility];  Service: General;  Laterality: N/A;     Home Medications:  Prior to Admission medications   Medication Sig Start Date End Date Taking? Authorizing Provider  acetaminophen (TYLENOL) 325 MG tablet Take 650 mg by mouth every 6 (six) hours as needed for mild pain.   Yes [provider]  aspirin EC 81 MG EC tablet Take 1 tablet (81 mg total) by mouth daily. 02/26/18  Yes Norval Morton, MD  atorvastatin (LIPITOR) 80 MG tablet Take 1 tablet (80 mg total) by mouth daily at 6 PM. 05/25/18  Yes Kilroy, Doreene Burke, PA-C  AURYXIA 1 GM 210 MG(Fe) tablet Take 210 mg by mouth 3 (three) times daily with meals.  04/07/17  Yes [provider]  calcium acetate (PHOSLO) 667 MG capsule Take 667 mg by mouth 3 (three) times daily with meals. 06/02/18  Yes [provider]  cinacalcet (SENSIPAR) 30 MG tablet Take 1 tablet (30 mg total) by mouth daily with supper. 04/28/18  Yes [name], MD    Cyanocobalamin (B-12 PO) Take 1 tablet by mouth daily.   Yes [provider]  folic acid (FOLVITE) 1 MG tablet Take 1 tablet (1 mg total) by mouth daily. 03/08/18  Yes [name], MD  gentamicin cream (GARAMYCIN) 0.1 % Apply 1 application topically See admin instructions. Apply to exit site daily as directed. 04/26/17  Yes [provider]  ondansetron (ZOFRAN ODT) 8 MG disintegrating tablet Take 1 tablet (8 mg total) by mouth every 8 (eight) hours as needed for nausea or vomiting. 04/09/18  Yes Jola Schmidt, MD  hydrOXYzine (ATARAX/VISTARIL) 10 MG tablet Take 10 mg by mouth 2 (two) times daily as needed for itching. 06/05/18   [provider]    Inpatient Medications: Scheduled Meds:  aspirin EC  81 mg Oral Daily   atorvastatin  80 mg Oral q1800   calcium acetate  667 mg Oral TID WC   ferric citrate  210 mg Oral TID WC   folic acid  1 mg Oral Daily   heparin  5,000 Units Subcutaneous Q8H  insulin aspart  0-9 Units Subcutaneous Q4H   Continuous Infusions:  PRN Meds: acetaminophen, hydrOXYzine, ondansetron (ZOFRAN) IV, ondansetron  Allergies:    Allergies  Allergen Reactions   Amlodipine Swelling   Clonidine Derivatives Swelling    Limbs swell   Doxycycline Nausea And Vomiting    "I threw up for 3 hours"   Welchol [Colesevelam Hcl] Nausea Only    Social History:   Social History   Socioeconomic History   Marital status: Married    Spouse name: Not on file   Number of children: 3   Years of education: Not on file   Highest education level: Not on file  Occupational History   Occupation: Research scientist (physical sciences): [employer]  Social Needs   Financial resource strain: Not on file   Food insecurity:    Worry: Not on file    Inability: Not on file   Transportation needs:    Medical: Not on file    Non-medical: Not on file  Tobacco Use   Smoking status: Former Smoker    Packs/day: 1.00    Years: 16.00    Pack years:  16.00    Types: Cigarettes    Last attempt to quit: 2009    Years since quitting: 11.4   Smokeless tobacco: Never Used  Substance and Sexual Activity   Alcohol use: Never    Frequency: Never   Drug use: No   Sexual activity: Yes    Birth control/protection: Post-menopausal  Lifestyle   Physical activity:    Days per week: Not on file    Minutes per session: Not on file   Stress: Not on file  Relationships   Social connections:    Talks on phone: Not on file    Gets together: Not on file    Attends religious service: Not on file    Active member of club or organization: Not on file    Attends meetings of clubs or organizations: Not on file    Relationship status: Not on file   Intimate partner violence:    Fear of current or ex partner: Not on file    Emotionally abused: Not on file    Physically abused: Not on file    Forced sexual activity: Not on file  Other Topics Concern   Not on file  Social History Narrative   Not on file     Family History:    Family History  Problem Relation Age of Onset   Hypertension Mother    Mitral valve prolapse Mother    Diabetes Father    Arthritis Other    Hyperlipidemia Other    Cancer Neg Hx    Heart disease Neg Hx    Kidney disease Neg Hx    Stroke Neg Hx       Review of Systems    General:  No chills, fever, night sweats or weight changes.  Cardiovascular:  No dyspnea on exertion, edema, orthopnea, palpitations, paroxysmal nocturnal dyspnea. Positive for chest pain.  Dermatological: No rash, lesions/masses Respiratory: No cough, dyspnea Urologic: No hematuria, dysuria Abdominal:   No vomiting, diarrhea, bright red blood per rectum, melena, or hematemesis. Positive for nausea.  Neurologic:  No visual changes, wkns, changes in mental status. All other systems reviewed and are otherwise negative except as noted above.  Physical Exam/Data    Vitals:   06/18/18 2245 06/18/18 2300 06/19/18 0016 06/19/18  0515  BP: (!) 148/85 (!) 152/88 (!) 167/96 (!) 150/85  Pulse: 70 70 66 71  Resp: 18 17  17   Temp:   97.6 F (36.4 C) 98 F (36.7 C)  TempSrc:   Oral Oral  SpO2: 100% 100% 100% 100%  Weight:    115.9 kg  Height:        Intake/Output Summary (Last 24 hours) at 06/19/2018 0817 Last data filed at 06/19/2018 0030 Gross per 24 hour  Intake 3 ml  Output --  Net 3 ml   Filed Weights   06/18/18 2135 06/19/18 0515  Weight: 111.1 kg 115.9 kg   Body mass index is 42.53 kg/m.   General: Pleasant, obese African American female appearing in NAD Psych: Normal affect. Neuro: Alert and oriented X 3. Moves all extremities spontaneously. HEENT: Normal  Neck: Supple without bruits or JVD. Lungs:  Resp regular and unlabored, CTA without wheezing or rales. Heart: RRR no s3, s4, or murmurs. Abdomen: Soft, non-tender, non-distended, BS + x 4.  Extremities: No clubbing, cyanosis or edema. DP/PT/Radials 2+ and equal bilaterally.  Telemetry:  Telemetry was personally reviewed and demonstrates: NSR, HR in 60's to 70's.    Labs/Studies     Relevant CV Studies:  Cardiac Catheterization: 02/2018       There is hyperdynamic left ventricular systolic function. The left ventricular ejection fraction is greater than 65% by visual estimate.  LV end diastolic pressure is normal.  Mid LAD lesion is 45% stenosed. -Extensively calcified vessel  Mid Cx to Dist Cx lesion is 55% stenosed with 40% stenosed side branch in Ost 3rd Mrg.  Prox RCA lesion is 65% stenosed. -- DFR 0.95 (NOT SIGNIFICANT)   SUMMARY  Moderate three-vessel disease with extensive calcification of all 3 major coronary arteries: Proximal RCA 60 to 65% DFR negative, ostial OM1 40% followed by 50% mid OM1, diffuse 40 to 50% calcified mid LAD.  No obvious culprit lesion with negative DFR on the RCA lesion.  Normal LVEDP.  Hyperdynamic LV with EF greater than 65%   RECOMMENDATIONS  Recommend medical management.  Consider  beta-blocker based on hyperdynamic LV.  Suspect that her elevated troponin is related to demand ischemia in the setting of microvascular disease  Laboratory Data:  Chemistry Recent Labs  Lab 06/18/18 2033 06/19/18 0534  NA 137 142  K 3.1* 3.4*  CL 97* 101  CO2 22 21*  GLUCOSE 153* 96  BUN 47* 50*  CREATININE 11.83* 12.15*  CALCIUM 7.6* 7.6*  GFRNONAA 3* 3*  GFRAA 4* 4*  ANIONGAP 18* 20*    Recent Labs  Lab 06/18/18 2314  PROT 6.4*  ALBUMIN 2.9*  AST 29  ALT 22  ALKPHOS 98  BILITOT 0.7   Hematology Recent Labs  Lab 06/18/18 2033  WBC 9.0  RBC 3.18*  HGB 9.8*  HCT 30.6*  MCV 96.2  MCH 30.8  MCHC 32.0  RDW 15.2  PLT 287   Cardiac Enzymes Recent Labs  Lab 06/18/18 2033 06/18/18 2302 06/19/18 0534  TROPONINI 0.08* 0.23* 1.04*   No results for input(s): TROPIPOC in the last 168 hours.  BNP Recent Labs  Lab 06/18/18 2314  BNP 110.2*    DDimer No results for input(s): DDIMER in the last 168 hours.  Radiology/Studies:  Dg Chest 2 View  Result Date: 06/18/2018 CLINICAL DATA:  Mid chest pain. EXAM: CHEST - 2 VIEW COMPARISON:  April 19, 2018 FINDINGS: The heart size and mediastinal contours are within normal limits. Both lungs are clear. The visualized skeletal structures are unremarkable. IMPRESSION: No active cardiopulmonary disease.  Electronically Signed   By: Dorise Bullion III M.D   On: 06/18/2018 20:47     Assessment & Plan    1. NSTEMI - the patient has known CAD with recent cath in 02/2018 showing moderate 3-vessel CAD with 45% mid-LAD,  55% LCx and 65% RCA stenosis which was not significant by FFR.  - presented with recurrent episodes of chest pain for the past week, occurring at rest but lasting for approximately 30 minutes and relieved with ASA.  - Initial troponin 0.08 with repeat values trending upwards to 0.23 and 1.04 (values peaked at 0.15 during admission in 02/2018). Initial EKG showing NSR, HR 72, with no acute ST abnormalities. -  given her known CAD, increasing troponin values, and presenting symptoms will review with Dr. Bronson Ing but would anticipate a repeat LHC tomorrow. Start Heparin. Continue ASA and statin therapy. Additional medical therapy has been limited by her intermittent hypotension.   2. HTN - BP variable at 121/62 - 167/96 this admission. Actually on Midodrine as an outpatient due to hypotension. Continue to follow readings. Previously intolerant to low-dose BB therapy due to orthostasis.   3. HLD - FLP in 04/2018 showed total cholesterol of 218, HDL 47, and LDL 147. Had been without Lipitor for several weeks at that time but has now restarted. Continue high-intensity statin therapy. Recheck FLP in 4-6 more weeks.    4. Prolonged QT - Initial EKG showed a prolonged QT at 521 ms with similar results by calculated corrected QT in the setting of electrolyte abnormalities. Will repeat 12-Lead EKG this AM. Continue to follow.   5. Type 2 DM - Hgb A1c at 6.6 in 02/2018.  6. ESRD - on peritoneal dialysis. Nephrology has been consulted to assist with dialysis during admission.   For questions or updates, please contact [place] Please consult [email or web address] for contact info under Cardiology/STEMI.  Signed, Erma Heritage, PA-C 06/19/2018, 8:17 AM Pager: [phone]  The patient was seen and examined, and I agree with the history, physical exam, assessment and plan as documented above, with modifications as noted below. I have also personally reviewed all relevant documentation, old records, labs, and both radiographic and cardiovascular studies. I have also independently interpreted old and new ECG's.  Briefly, this is a 53 year old woman with a history of three-vessel moderate coronary artery disease by coronary angiography in February 2020, hypertension, hyperlipidemia, type 2 diabetes mellitus, and end-stage renal disease on peritoneal dialysis.  This past Thursday night at 1 AM she was  awoken by retrosternal chest pain.  She took 2 aspirin and drank some water and sat up on the edge of her bed for 30 minutes.  The pain resolved and she did well throughout Friday and had been up walking around and went to the grocery store without any difficulties.  They had some family visiting on Saturday afternoon.  She then got in her truck with her husband to go to the store and about 5 minutes into the drive she began experiencing retrosternal chest pain accompanied by shortness of breath and diaphoresis.  She was worried and asked her husband to bring her to the ED.  Labs reviewed above with most recent troponin I 0.04.  Hemoglobin is 9.8.  ECG demonstrated sinus rhythm with no acute ST segment abnormalities and prolonged QT interval of 521 ms.  She is currently feeling well.  She has had fluctuating blood pressures and has been on midodrine in the outpatient setting for hypotension.  She has not been  able to tolerate beta-blockers.  Overall presentation consistent with non-STEMI.  Continue aspirin and statin.  We will start IV heparin and arrange for cardiac catheterization on 06/20/2018. Risks and benefits of cardiac catheterization have been discussed with the patient.  These include bleeding, infection, kidney damage, stroke, heart attack, death.  The patient understands these risks and is willing to proceed.   Kate Sable, MD, [facility]  06/19/2018 8:55 AM

## 2018-06-19 NOTE — Telephone Encounter (Signed)
Pt is currently in the hospital with a possible cardiac event. Appointment cancelled and pt knows to call us once she is released and has cardiac clearance for procedure.

## 2018-06-20 ENCOUNTER — Encounter (HOSPITAL_COMMUNITY)
Admission: EM | Disposition: A | Discharge status: Home / Self Care | Payer: Self-pay | Source: Home / Self Care | Attending: Internal Medicine

## 2018-06-20 ENCOUNTER — Encounter (HOSPITAL_COMMUNITY): Payer: Self-pay | Admitting: Internal Medicine

## 2018-06-20 DIAGNOSIS — I2510 Atherosclerotic heart disease of native coronary artery without angina pectoris: Secondary | ICD-10-CM

## 2018-06-20 DIAGNOSIS — E876 Hypokalemia: Secondary | ICD-10-CM

## 2018-06-20 HISTORY — PX: LEFT HEART CATH AND CORONARY ANGIOGRAPHY: CATH118249

## 2018-06-20 LAB — GLUCOSE, CAPILLARY
Glucose-Capillary: 118 mg/dL — ABNORMAL HIGH (ref 70–99)
Glucose-Capillary: 120 mg/dL — ABNORMAL HIGH (ref 70–99)
Glucose-Capillary: 123 mg/dL — ABNORMAL HIGH (ref 70–99)
Glucose-Capillary: 127 mg/dL — ABNORMAL HIGH (ref 70–99)
Glucose-Capillary: 134 mg/dL — ABNORMAL HIGH (ref 70–99)
Glucose-Capillary: 143 mg/dL — ABNORMAL HIGH (ref 70–99)
Glucose-Capillary: 251 mg/dL — ABNORMAL HIGH (ref 70–99)
Glucose-Capillary: 87 mg/dL (ref 70–99)

## 2018-06-20 LAB — CBC
HCT: 31.8 % — ABNORMAL LOW (ref 36.0–46.0)
Hemoglobin: 10.5 g/dL — ABNORMAL LOW (ref 12.0–15.0)
MCH: 31.2 pg (ref 26.0–34.0)
MCHC: 33 g/dL (ref 30.0–36.0)
MCV: 94.4 fL (ref 80.0–100.0)
Platelets: 261 10*3/uL (ref 150–400)
RBC: 3.37 MIL/uL — ABNORMAL LOW (ref 3.87–5.11)
RDW: 15 % (ref 11.5–15.5)
WBC: 6.8 10*3/uL (ref 4.0–10.5)
nRBC: 0 % (ref 0.0–0.2)

## 2018-06-20 LAB — HEPARIN LEVEL (UNFRACTIONATED): Heparin Unfractionated: 0.7 IU/mL (ref 0.30–0.70)

## 2018-06-20 LAB — BASIC METABOLIC PANEL
Anion gap: 17 — ABNORMAL HIGH (ref 5–15)
BUN: 49 mg/dL — ABNORMAL HIGH (ref 6–20)
CO2: 21 mmol/L — ABNORMAL LOW (ref 22–32)
Calcium: 7.7 mg/dL — ABNORMAL LOW (ref 8.9–10.3)
Chloride: 99 mmol/L (ref 98–111)
Creatinine, Ser: 12.36 mg/dL — ABNORMAL HIGH (ref 0.44–1.00)
GFR calc Af Amer: 4 mL/min — ABNORMAL LOW (ref >60)
GFR calc non Af Amer: 3 mL/min — ABNORMAL LOW (ref >60)
Glucose, Bld: 151 mg/dL — ABNORMAL HIGH (ref 70–99)
Potassium: 2.8 mmol/L — ABNORMAL LOW (ref 3.5–5.1)
Sodium: 137 mmol/L (ref 135–145)

## 2018-06-20 LAB — POCT ACTIVATED CLOTTING TIME: Activated Clotting Time: 153 seconds

## 2018-06-20 SURGERY — LEFT HEART CATH AND CORONARY ANGIOGRAPHY
Anesthesia: LOCAL

## 2018-06-20 MED ORDER — LABETALOL HCL 5 MG/ML IV SOLN: 10.0000 mg | INTRAVENOUS | Status: AC | PRN

## 2018-06-20 MED ORDER — ALUM & MAG HYDROXIDE-SIMETH 200-200-20 MG/5ML PO SUSP
15.0000 mL | Freq: Four times a day (QID) | ORAL | Status: DC | PRN
  Administered 2018-06-20: 15 mL via ORAL
  Filled 2018-06-20: qty 30

## 2018-06-20 MED ORDER — HEPARIN (PORCINE) IN NACL 1000-0.9 UT/500ML-% IV SOLN
INTRAVENOUS | Status: DC | PRN
  Administered 2018-06-20 (×2): 500 mL

## 2018-06-20 MED ORDER — MIDAZOLAM HCL 2 MG/2ML IJ SOLN
INTRAMUSCULAR | Status: AC
  Filled 2018-06-20: qty 2

## 2018-06-20 MED ORDER — HEPARIN (PORCINE) IN NACL 1000-0.9 UT/500ML-% IV SOLN
INTRAVENOUS | Status: AC
  Filled 2018-06-20: qty 1000

## 2018-06-20 MED ORDER — IOHEXOL 350 MG/ML SOLN
INTRAVENOUS | Status: DC | PRN
  Administered 2018-06-20: 11:00:00 65 mL via INTRA_ARTERIAL

## 2018-06-20 MED ORDER — SODIUM CHLORIDE 0.9% FLUSH: 3.0000 mL | INTRAVENOUS | Status: DC | PRN

## 2018-06-20 MED ORDER — LORAZEPAM 2 MG/ML IJ SOLN
1.0000 mg | Freq: Once | INTRAMUSCULAR | Status: DC
  Filled 2018-06-20: qty 1

## 2018-06-20 MED ORDER — LIDOCAINE HCL (PF) 1 % IJ SOLN
INTRAMUSCULAR | Status: DC | PRN
  Administered 2018-06-20: 17 mL via INTRADERMAL

## 2018-06-20 MED ORDER — FENTANYL CITRATE (PF) 100 MCG/2ML IJ SOLN
INTRAMUSCULAR | Status: AC
  Filled 2018-06-20: qty 2

## 2018-06-20 MED ORDER — SODIUM CHLORIDE 0.9 % IV SOLN: 250.0000 mL | INTRAVENOUS | Status: DC | PRN

## 2018-06-20 MED ORDER — MORPHINE SULFATE (PF) 2 MG/ML IV SOLN
INTRAVENOUS | Status: AC
  Filled 2018-06-20: qty 1

## 2018-06-20 MED ORDER — HYDRALAZINE HCL 20 MG/ML IJ SOLN
10.0000 mg | INTRAMUSCULAR | Status: AC | PRN
  Administered 2018-06-20: 10 mg via INTRAVENOUS

## 2018-06-20 MED ORDER — NITROGLYCERIN 0.4 MG SL SUBL
SUBLINGUAL_TABLET | SUBLINGUAL | Status: AC
  Administered 2018-06-20: 0.4 mg
  Filled 2018-06-20: qty 1

## 2018-06-20 MED ORDER — MIDAZOLAM HCL 2 MG/2ML IJ SOLN
INTRAMUSCULAR | Status: DC | PRN
  Administered 2018-06-20: 1 mg via INTRAVENOUS

## 2018-06-20 MED ORDER — POTASSIUM CHLORIDE CRYS ER 20 MEQ PO TBCR
40.0000 meq | EXTENDED_RELEASE_TABLET | Freq: Once | ORAL | Status: AC
  Administered 2018-06-20: 40 meq via ORAL
  Filled 2018-06-20: qty 2

## 2018-06-20 MED ORDER — HYDRALAZINE HCL 20 MG/ML IJ SOLN
INTRAMUSCULAR | Status: AC
  Filled 2018-06-20: qty 1

## 2018-06-20 MED ORDER — LIDOCAINE HCL (PF) 1 % IJ SOLN
INTRAMUSCULAR | Status: AC
  Filled 2018-06-20: qty 30

## 2018-06-20 MED ORDER — HEPARIN SODIUM (PORCINE) 5000 UNIT/ML IJ SOLN
5000.0000 [IU] | Freq: Three times a day (TID) | INTRAMUSCULAR | Status: DC
  Administered 2018-06-21: 5000 [IU] via SUBCUTANEOUS
  Filled 2018-06-20: qty 1

## 2018-06-20 MED ORDER — SODIUM CHLORIDE 0.9% FLUSH
3.0000 mL | Freq: Two times a day (BID) | INTRAVENOUS | Status: DC
  Administered 2018-06-20 – 2018-06-21 (×3): 3 mL via INTRAVENOUS

## 2018-06-20 MED ORDER — MORPHINE SULFATE (PF) 2 MG/ML IV SOLN
2.0000 mg | Freq: Once | INTRAVENOUS | Status: AC
  Administered 2018-06-20: 2 mg via INTRAVENOUS

## 2018-06-20 MED ORDER — ISOSORBIDE MONONITRATE ER 30 MG PO TB24
30.0000 mg | ORAL_TABLET | Freq: Every day | ORAL | Status: DC
  Administered 2018-06-20: 30 mg via ORAL
  Filled 2018-06-20: qty 1

## 2018-06-20 MED ORDER — FENTANYL CITRATE (PF) 100 MCG/2ML IJ SOLN
INTRAMUSCULAR | Status: DC | PRN
  Administered 2018-06-20: 50 ug via INTRAVENOUS

## 2018-06-20 SURGICAL SUPPLY — 11 items
CATH INFINITI 5FR MULTPACK ANG (CATHETERS) ×1 IMPLANT
COVER DOME SNAP 22 D (MISCELLANEOUS) ×1 IMPLANT
KIT HEART LEFT (KITS) ×2 IMPLANT
KIT MICROPUNCTURE NIT STIFF (SHEATH) ×1 IMPLANT
PACK CARDIAC CATHETERIZATION (CUSTOM PROCEDURE TRAY) ×2 IMPLANT
SHEATH PINNACLE 5F 10CM (SHEATH) ×1 IMPLANT
SHEATH PROBE COVER 6X72 (BAG) ×1 IMPLANT
SYR MEDRAD MARK 7 150ML (SYRINGE) ×2 IMPLANT
TRANSDUCER W/STOPCOCK (MISCELLANEOUS) ×2 IMPLANT
TUBING CIL FLEX 10 FLL-RA (TUBING) ×2 IMPLANT
WIRE EMERALD 3MM-J .035X150CM (WIRE) ×2 IMPLANT

## 2018-06-20 NOTE — Progress Notes (Signed)
[place] for IV heparin Indication: chest pain/ACS  Allergies  Allergen Reactions  . Amlodipine Swelling  . Clonidine Derivatives Swelling    Limbs swell  . Doxycycline Nausea And Vomiting    "I threw up for 3 hours"  . Welchol [Colesevelam Hcl] Nausea Only    Patient Measurements: Height: 5\' 5"  (165.1 cm) Weight: 268 lb 4.8 oz (121.7 kg) IBW/kg (Calculated) : 57 Heparin Dosing Weight: 83 kg  Vital Signs: Temp: 98.6 F (37 C) (06/01 0711) Temp Source: Oral (06/01 0711) BP: 151/83 (06/01 0711) Pulse Rate: 70 (06/01 0711)  Labs: Recent Labs    06/18/18 2033 06/18/18 2302 06/19/18 0534 06/19/18 1109 06/19/18 1736 06/20/18 0559  HGB 9.8*  --   --   --   --  10.5*  HCT 30.6*  --   --   --   --  31.8*  PLT 287  --   --   --   --  261  HEPARINUNFRC  --   --   --   --  0.50 0.70  CREATININE 11.83*  --  12.15*  --   --  12.36*  TROPONINI 0.08* 0.23* 1.04* 0.99*  --   --     Estimated Creatinine Clearance: 6.9 mL/min (A) (by C-G formula based on SCr of 12.36 mg/dL (H)).  Medications:  Infusions:  . sodium chloride    . sodium chloride 10 mL/hr at 06/20/18 4098  . dialysis solution 1.5% low-MG/low-CA    . dialysis solution 2.5% low-MG/low-CA    . heparin 1,150 Units/hr (06/20/18 0747)    Assessment: 53 yo female with hx ESRD on PD, admitted with chest pain and pharmacy asked to begin IV heparin.  Baseline Hgb slightly low, platelet count okay.  Known CAD from previous cath.  Heparin level upper end of therapeutic at 0.70, CBC stable and no bleeding reported.  Goal of Therapy:  Heparin level 0.3-0.7 units/ml Monitor platelets by anticoagulation protocol: Yes   Plan:  Decrease heparin gtt to 1100 units/hr Daily heparin level, CBC, s/s bleeding F/u s/p cath  Bertis Ruddy, PharmD Clinical Pharmacist Please check AMION for all [place] numbers 06/20/2018 8:22 AM

## 2018-06-20 NOTE — Progress Notes (Addendum)
[facility] PROGRESS NOTE  Brenda Davis JXB:[id number] DOB: Jun 10, 1965 DOA: 06/18/2018 PCP: [name], MD  Assessment/Plan: 1. NSTEMI. Troponin peak of 1.04. no change in multivessel CAD on LHC, suspect supply-deman mismatch, started imdur 30 mg, atorvastatin, monitor overnight likely DC if stable in am 2. ESRD on PD.  Denies any abdominal pain.  Nephrology on board and assisting with volume status 3. Hypokalemia. K 2.8 prior to cath s/p 40 meq po , repeat BMP in am 4. Anemia of CKD. Hgb stable at baseline 5. Type 2 diabetes.  A1c 6.4%, on no home medications 6. Hyperlipidemia, Lipitor 80 mg 7. Hypertension. Since cath much better control. Also on imdur now 8. Prolonged QT. Still prolonged on repeat at 531. Avoid prolonging agents  Code Status: Full code Family Communication: No family at bedside (indicate person spoken with, relationship, and if by phone, the number) Disposition Plan: monitor or worsening chest pain, dc in am if stable   Consultants:  Cardiology, nephrology  Procedures:  None  Antibiotics:  None (indicate start date, and stop date if known)  HPI/Subjective:  Brenda Davis is a 53 y.o. year old female with medical history significant for ESRD on PD, DM2, CAD with moderate non-occlusive triple vessel disease on LHC in Feb this year  who presented on 06/18/2018 with chest pain that initially began this past Thursday and again occurred the day prior to admission described as occurring in the center of her chest with radiation to her shoulders with associated nausea that was 10 out of 10 and eventually subsided after 45 minutes (previously she had taken aspirin with minimal relief)   and was found to have significantly elevated troponin concerning for NSTEMI.  Some slight discomfort after cath No dyspnea   Objective: Vitals:   06/20/18 1717 06/20/18 2001  BP: 134/81 120/73  Pulse: 73 84  Resp: 17 18  Temp: 97.6 F (36.4 C)  97.8 F (36.6 C)  SpO2: 100% 100%    Intake/Output Summary (Last 24 hours) at 06/20/2018 2221 Last data filed at 06/20/2018 1758 Gross per 24 hour  Intake 796.29 ml  Output -  Net 796.29 ml   Filed Weights   06/19/18 1830 06/20/18 0407 06/20/18 1717  Weight: 117.9 kg 121.7 kg 117.9 kg    Exam:   General: Middle age female, in no distress  Cardiovascular: Regular rate and rhythm, no edema  Respiratory: Normal respiratory effort on room air  Abdomen: Soft, nondistended, nontender, PD catheter site in place, no surrounding drainage or erythema  Musculoskeletal: Normal range of motion  Skin no rashes or lesions  Neurologic alert oriented x4, no appreciable focal deficits  Data Reviewed: Basic Metabolic Panel: Recent Labs  Lab 06/18/18 2033 06/19/18 0534 06/20/18 0559  NA 137 142 137  K 3.1* 3.4* 2.8*  CL 97* 101 99  CO2 22 21* 21*  GLUCOSE 153* 96 151*  BUN 47* 50* 49*  CREATININE 11.83* 12.15* 12.36*  CALCIUM 7.6* 7.6* 7.7*   Liver Function Tests: Recent Labs  Lab 06/18/18 2314  AST 29  ALT 22  ALKPHOS 98  BILITOT 0.7  PROT 6.4*  ALBUMIN 2.9*   No results for input(s): LIPASE, AMYLASE in the last 168 hours. No results for input(s): AMMONIA in the last 168 hours. CBC: Recent Labs  Lab 06/18/18 2033 06/20/18 0559  WBC 9.0 6.8  HGB 9.8* 10.5*  HCT 30.6* 31.8*  MCV 96.2 94.4  PLT 287 261   Cardiac Enzymes: Recent Labs  Lab  06/18/18 2033 06/18/18 2302 06/19/18 0534 06/19/18 1109  TROPONINI 0.08* 0.23* 1.04* 0.99*   BNP (last 3 results) Recent Labs    02/24/18 1027 06/18/18 2314  BNP 36.5 110.2*    ProBNP (last 3 results) No results for input(s): PROBNP in the last 8760 hours.  CBG: Recent Labs  Lab 06/20/18 0747 06/20/18 1156 06/20/18 1321 06/20/18 1756 06/20/18 2005  GLUCAP 134* 87 118* 143* 251*    Recent Results (from the past 240 hour(s))  SARS Coronavirus 2 (CEPHEID - Performed in [place] hospital lab), Hosp Order      Status: None   Collection Time: 06/18/18 11:14 PM  Result Value Ref Range Status   SARS Coronavirus 2 NEGATIVE NEGATIVE Final    Comment: (NOTE) If result is NEGATIVE SARS-CoV-2 target nucleic acids are NOT DETECTED. The SARS-CoV-2 RNA is generally detectable in upper and lower  respiratory specimens during the acute phase of infection. The lowest  concentration of SARS-CoV-2 viral copies this assay can detect is 250  copies / mL. A negative result does not preclude SARS-CoV-2 infection  and should not be used as the sole basis for treatment or other  patient management decisions.  A negative result may occur with  improper specimen collection / handling, submission of specimen other  than nasopharyngeal swab, presence of viral mutation(s) within the  areas targeted by this assay, and inadequate number of viral copies  (<250 copies / mL). A negative result must be combined with clinical  observations, patient history, and epidemiological information. If result is POSITIVE SARS-CoV-2 target nucleic acids are DETECTED. The SARS-CoV-2 RNA is generally detectable in upper and lower  respiratory specimens dur ing the acute phase of infection.  Positive  results are indicative of active infection with SARS-CoV-2.  Clinical  correlation with patient history and other diagnostic information is  necessary to determine patient infection status.  Positive results do  not rule out bacterial infection or co-infection with other viruses. If result is PRESUMPTIVE POSTIVE SARS-CoV-2 nucleic acids MAY BE PRESENT.   A presumptive positive result was obtained on the submitted specimen  and confirmed on repeat testing.  While 2019 novel coronavirus  (SARS-CoV-2) nucleic acids may be present in the submitted sample  additional confirmatory testing may be necessary for epidemiological  and / or clinical management purposes  to differentiate between  SARS-CoV-2 and other Sarbecovirus currently known to  infect humans.  If clinically indicated additional testing with an alternate test  methodology [phone]) is advised. The SARS-CoV-2 RNA is generally  detectable in upper and lower respiratory sp ecimens during the acute  phase of infection. The expected result is Negative. Fact Sheet for Patients:  StrictlyIdeas.no Fact Sheet for Healthcare Providers: BankingDealers.co.za This test is not yet approved or cleared by the [place] FDA and has been authorized for detection and/or diagnosis of SARS-CoV-2 by FDA under an Emergency Use Authorization (EUA).  This EUA will remain in effect (meaning this test can be used) for the duration of the COVID-19 declaration under Section 564(b)(1) of the Act, 21 U.S.C. section 360bbb-3(b)(1), unless the authorization is terminated or revoked sooner. Performed at [facility], [place] [address], [place], [place] [address]      Studies: No results found.  Scheduled Meds: . [START ON 06/21/2018] aspirin EC  81 mg Oral Daily  . atorvastatin  80 mg Oral q1800  . calcium acetate  667 mg Oral TID WC  . ferric citrate  210 mg Oral TID WC  .  folic acid  1 mg Oral Daily  . gentamicin cream  1 application Topical Daily  . [START ON 06/21/2018] heparin  5,000 Units Subcutaneous Q8H  . insulin aspart  0-9 Units Subcutaneous Q4H  . isosorbide mononitrate  30 mg Oral Daily  . sodium chloride flush  3 mL Intravenous Q12H  . sodium chloride flush  3 mL Intravenous Q12H   Continuous Infusions: . sodium chloride    . dialysis solution 1.5% low-MG/low-CA    . dialysis solution 2.5% low-MG/low-CA      Principal Problem:   Chest pain, rule out acute myocardial infarction Active Problems:   Hypokalemia   ESRD on peritoneal dialysis ([place])   DM (diabetes mellitus), type 2 with renal complications (HCC)   Hepatic cirrhosis (HCC)   NSTEMI (non-ST elevated myocardial infarction) ([place])   CAD in native artery    Hypocalcemia      Desiree Hane  [facility]

## 2018-06-20 NOTE — Progress Notes (Signed)
Progress Note  Patient Name: Brenda Davis Date of Encounter: 06/20/2018  Primary Cardiologist: Skeet Latch, MD   Subjective   No chest pain this am.   Inpatient Medications    Scheduled Meds: . [START ON 06/21/2018] aspirin EC  81 mg Oral Daily  . atorvastatin  80 mg Oral q1800  . calcium acetate  667 mg Oral TID WC  . ferric citrate  210 mg Oral TID WC  . folic acid  1 mg Oral Daily  . gentamicin cream  1 application Topical Daily  . insulin aspart  0-9 Units Subcutaneous Q4H  . LORazepam  1 mg Intravenous Once  . sodium chloride flush  3 mL Intravenous Q12H   Continuous Infusions: . sodium chloride    . sodium chloride 10 mL/hr at 06/20/18 3491  . dialysis solution 1.5% low-MG/low-CA    . dialysis solution 2.5% low-MG/low-CA    . heparin Stopped (06/20/18 0940)   PRN Meds: sodium chloride, acetaminophen, hydrOXYzine, ondansetron (ZOFRAN) IV, ondansetron, sodium chloride flush   Vital Signs    Vitals:   06/19/18 1830 06/19/18 2111 06/20/18 0407 06/20/18 0711  BP: (!) 142/85 (!) 161/89 123/87 (!) 151/83  Pulse: 68 72 82 70  Resp: 18  18 16   Temp: 98 F (36.7 C) 98.1 F (36.7 C) 98 F (36.7 C) 98.6 F (37 C)  TempSrc: Oral Oral Oral Oral  SpO2: 100% 100% 100% 98%  Weight: 117.9 kg  121.7 kg   Height:        Intake/Output Summary (Last 24 hours) at 06/20/2018 0945 Last data filed at 06/20/2018 7915 Gross per 24 hour  Intake 211.29 ml  Output -  Net 211.29 ml   Last 3 Weights 06/20/2018 06/19/2018 06/19/2018  Weight (lbs) 268 lb 4.8 oz 259 lb 14.8 oz 255 lb 9.6 oz  Weight (kg) 121.7 kg 117.9 kg 115.939 kg      Telemetry    sinus- Personally Reviewed  ECG    Sinus, No ischemic changes- Personally Reviewed  Physical Exam   GEN: No acute distress.   Neck: No JVD Cardiac: RRR, no murmurs, rubs, or gallops.  Respiratory: Clear to auscultation bilaterally. GI: Soft, nontender, non-distended  MS: No edema; No deformity. Neuro:  Nonfocal   Psych: Normal affect   Labs    Chemistry Recent Labs  Lab 06/18/18 2033 06/18/18 2314 06/19/18 0534 06/20/18 0559  NA 137  --  142 137  K 3.1*  --  3.4* 2.8*  CL 97*  --  101 99  CO2 22  --  21* 21*  GLUCOSE 153*  --  96 151*  BUN 47*  --  50* 49*  CREATININE 11.83*  --  12.15* 12.36*  CALCIUM 7.6*  --  7.6* 7.7*  PROT  --  6.4*  --   --   ALBUMIN  --  2.9*  --   --   AST  --  29  --   --   ALT  --  22  --   --   ALKPHOS  --  98  --   --   BILITOT  --  0.7  --   --   GFRNONAA 3*  --  3* 3*  GFRAA 4*  --  4* 4*  ANIONGAP 18*  --  20* 17*     Hematology Recent Labs  Lab 06/18/18 2033 06/20/18 0559  WBC 9.0 6.8  RBC 3.18* 3.37*  HGB 9.8* 10.5*  HCT 30.6* 31.8*  MCV 96.2 94.4  MCH 30.8 31.2  MCHC 32.0 33.0  RDW 15.2 15.0  PLT 287 261    Cardiac Enzymes Recent Labs  Lab 06/18/18 2033 06/18/18 2302 06/19/18 0534 06/19/18 1109  TROPONINI 0.08* 0.23* 1.04* 0.99*   No results for input(s): TROPIPOC in the last 168 hours.   BNP Recent Labs  Lab 06/18/18 2314  BNP 110.2*     DDimer No results for input(s): DDIMER in the last 168 hours.   Radiology    Dg Chest 2 View  Result Date: 06/18/2018 CLINICAL DATA:  Mid chest pain. EXAM: CHEST - 2 VIEW COMPARISON:  April 19, 2018 FINDINGS: The heart size and mediastinal contours are within normal limits. Both lungs are clear. The visualized skeletal structures are unremarkable. IMPRESSION: No active cardiopulmonary disease. Electronically Signed   By: Dorise Bullion III M.D   On: 06/18/2018 20:47    Cardiac Studies     Patient Profile     53 y.o. female with h/o CAD and ESRD admitted with unstable angina/NSTEMI. Cardiac cath in February 2020 with moderate RCA disease (DFR 0.95 so no PCI performed).   Assessment & Plan    1. CAD/NSTEMI: plan cardiac cath with possible PCI today. Continue ASA, statin and IV heparin.   2. Hypokalemia: Potassium replaced this am.      For questions or updates, please  contact [place] Please consult [email or web address] for contact info under        Signed, Lauree Chandler, MD  06/20/2018, 9:45 AM

## 2018-06-20 NOTE — Progress Notes (Signed)
Patient ID: Brenda Davis, female   DOB: [birth year], 53 y.o.   MRN: [id number]  [facility] Progress Note   Assessment/ Plan:   1.  Chest pain/non-ST elevation MI: Plans noted for repeat coronary angiography given recurrence of chest pain since her coronary angiogram back in February.  Currently asymptomatic. 2. ESRD: Continue CCPD at current prescription-appears to be euvolemic.  Abdomen drained to allow for supine positioning for coronary angiography.  Will give potassium supplement. 3. Anemia: Without overt loss, continue to monitor for ESA dosing. 4. CKD-MBD: On calcium acetate and Auryxia for phosphorus binding.  Will follow phosphorus levels.  Not on VDR A. 5. Nutrition: Continue renal diet/nutritional supplementation. 6. Hypertension: Blood pressure marginally elevated, monitor with ultrafiltration/PD.  Subjective:   Denies any chest pain or emerging shortness of breath overnight   Objective:   BP (!) 151/83 (BP Location: Right Arm)   Pulse 70   Temp 98.6 F (37 C) (Oral)   Resp 16   Ht 5\' 5"  (1.651 m)   Wt 121.7 kg   SpO2 98%   BMI 44.65 kg/m   Physical Exam: Gen: Resting comfortably in bed CVS: Pulse regular rhythm, normal rate, S1 and S2 normal Resp: Diminished breath sounds over bases, clear to auscultation over upper lung fields.  No rales/rhonchi Abd: Soft, obese, nontender.  PD catheter in situ Ext: No lower extremity edema  Labs: BMET Recent Labs  Lab 06/18/18 2033 06/19/18 0534 06/20/18 0559  NA 137 142 137  K 3.1* 3.4* 2.8*  CL 97* 101 99  CO2 22 21* 21*  GLUCOSE 153* 96 151*  BUN 47* 50* 49*  CREATININE 11.83* 12.15* 12.36*  CALCIUM 7.6* 7.6* 7.7*   CBC Recent Labs  Lab 06/18/18 2033 06/20/18 0559  WBC 9.0 6.8  HGB 9.8* 10.5*  HCT 30.6* 31.8*  MCV 96.2 94.4  PLT 287 261   Medications:    . [START ON 06/21/2018] aspirin EC  81 mg Oral Daily  . atorvastatin  80 mg Oral q1800  . calcium acetate  667 mg Oral TID  WC  . ferric citrate  210 mg Oral TID WC  . folic acid  1 mg Oral Daily  . gentamicin cream  1 application Topical Daily  . insulin aspart  0-9 Units Subcutaneous Q4H  . LORazepam  1 mg Intravenous Once  . sodium chloride flush  3 mL Intravenous Q12H   Elmarie Shiley, MD 06/20/2018, 9:37 AM

## 2018-06-20 NOTE — H&P (View-Only) (Signed)
Progress Note  Patient Name: Brenda Davis Date of Encounter: 06/20/2018  Primary Cardiologist: Skeet Latch, MD   Subjective   No chest pain this am.   Inpatient Medications    Scheduled Meds: . [START ON 06/21/2018] aspirin EC  81 mg Oral Daily  . atorvastatin  80 mg Oral q1800  . calcium acetate  667 mg Oral TID WC  . ferric citrate  210 mg Oral TID WC  . folic acid  1 mg Oral Daily  . gentamicin cream  1 application Topical Daily  . insulin aspart  0-9 Units Subcutaneous Q4H  . LORazepam  1 mg Intravenous Once  . sodium chloride flush  3 mL Intravenous Q12H   Continuous Infusions: . sodium chloride    . sodium chloride 10 mL/hr at 06/20/18 1607  . dialysis solution 1.5% low-MG/low-CA    . dialysis solution 2.5% low-MG/low-CA    . heparin Stopped (06/20/18 0940)   PRN Meds: sodium chloride, acetaminophen, hydrOXYzine, ondansetron (ZOFRAN) IV, ondansetron, sodium chloride flush   Vital Signs    Vitals:   06/19/18 1830 06/19/18 2111 06/20/18 0407 06/20/18 0711  BP: (!) 142/85 (!) 161/89 123/87 (!) 151/83  Pulse: 68 72 82 70  Resp: 18  18 16   Temp: 98 F (36.7 C) 98.1 F (36.7 C) 98 F (36.7 C) 98.6 F (37 C)  TempSrc: Oral Oral Oral Oral  SpO2: 100% 100% 100% 98%  Weight: 117.9 kg  121.7 kg   Height:        Intake/Output Summary (Last 24 hours) at 06/20/2018 0945 Last data filed at 06/20/2018 3710 Gross per 24 hour  Intake 211.29 ml  Output -  Net 211.29 ml   Last 3 Weights 06/20/2018 06/19/2018 06/19/2018  Weight (lbs) 268 lb 4.8 oz 259 lb 14.8 oz 255 lb 9.6 oz  Weight (kg) 121.7 kg 117.9 kg 115.939 kg      Telemetry    sinus- Personally Reviewed  ECG    Sinus, No ischemic changes- Personally Reviewed  Physical Exam   GEN: No acute distress.   Neck: No JVD Cardiac: RRR, no murmurs, rubs, or gallops.  Respiratory: Clear to auscultation bilaterally. GI: Soft, nontender, non-distended  MS: No edema; No deformity. Neuro:  Nonfocal   Psych: Normal affect   Labs    Chemistry Recent Labs  Lab 06/18/18 2033 06/18/18 2314 06/19/18 0534 06/20/18 0559  NA 137  --  142 137  K 3.1*  --  3.4* 2.8*  CL 97*  --  101 99  CO2 22  --  21* 21*  GLUCOSE 153*  --  96 151*  BUN 47*  --  50* 49*  CREATININE 11.83*  --  12.15* 12.36*  CALCIUM 7.6*  --  7.6* 7.7*  PROT  --  6.4*  --   --   ALBUMIN  --  2.9*  --   --   AST  --  29  --   --   ALT  --  22  --   --   ALKPHOS  --  98  --   --   BILITOT  --  0.7  --   --   GFRNONAA 3*  --  3* 3*  GFRAA 4*  --  4* 4*  ANIONGAP 18*  --  20* 17*     Hematology Recent Labs  Lab 06/18/18 2033 06/20/18 0559  WBC 9.0 6.8  RBC 3.18* 3.37*  HGB 9.8* 10.5*  HCT 30.6* 31.8*  MCV 96.2 94.4  MCH 30.8 31.2  MCHC 32.0 33.0  RDW 15.2 15.0  PLT 287 261    Cardiac Enzymes Recent Labs  Lab 06/18/18 2033 06/18/18 2302 06/19/18 0534 06/19/18 1109  TROPONINI 0.08* 0.23* 1.04* 0.99*   No results for input(s): TROPIPOC in the last 168 hours.   BNP Recent Labs  Lab 06/18/18 2314  BNP 110.2*     DDimer No results for input(s): DDIMER in the last 168 hours.   Radiology    Dg Chest 2 View  Result Date: 06/18/2018 CLINICAL DATA:  Mid chest pain. EXAM: CHEST - 2 VIEW COMPARISON:  April 19, 2018 FINDINGS: The heart size and mediastinal contours are within normal limits. Both lungs are clear. The visualized skeletal structures are unremarkable. IMPRESSION: No active cardiopulmonary disease. Electronically Signed   By: Dorise Bullion III M.D   On: 06/18/2018 20:47    Cardiac Studies     Patient Profile     53 y.o. female with h/o CAD and ESRD admitted with unstable angina/NSTEMI. Cardiac cath in February 2020 with moderate RCA disease (DFR 0.95 so no PCI performed).   Assessment & Plan    1. CAD/NSTEMI: plan cardiac cath with possible PCI today. Continue ASA, statin and IV heparin.   2. Hypokalemia: Potassium replaced this am.      For questions or updates, please  contact [place] Please consult [email or web address] for contact info under        Signed, Lauree Chandler, MD  06/20/2018, 9:45 AM

## 2018-06-20 NOTE — Progress Notes (Signed)
Patient C/O chest pain and had some SOB, placed on 2l O2 via N/C, gave 1 NTG S/L , got an EKG and spoke with Dr Jeannette Corpus re: Pt's condition, VS, EKG. He ordered some Hydralazine for elevated B/P but her pressure came down fro SBP 170 to 128. He ordered 1 mg IV Ativan but patient is refusing for now, states that she feels much better, will continue to monitor.

## 2018-06-20 NOTE — Progress Notes (Signed)
     5 F R/F artery sheath was pulled manually, and pressure was held for 20 min. Sterile gauze was applied at the site. R groin area is soft and non tender. R DP was palpable before and after the sheath pull. Bed rest started at 1240 X 4 hr. Instructions were given to patient about bed rest.  HR 76 NSR BP 83/41 sPO2 99% om R/A

## 2018-06-20 NOTE — Progress Notes (Signed)
Renal Navigator notified PD Home Program Manager/H. Headen of patient's admission and negative COVID 19 rapid test result to provide continuity of care and safety.  Alphonzo Cruise, [place] Renal Navigator [phone]

## 2018-06-20 NOTE — Interval H&P Note (Signed)
History and Physical Interval Note:  06/20/2018 10:27 AM  Brenda Davis  has presented today for cardiac catheterization, with the diagnosis of NSTEMI.  The various methods of treatment have been discussed with the patient and family. After consideration of risks, benefits and other options for treatment, the patient has consented to  Procedure(s): LEFT HEART CATH AND CORONARY ANGIOGRAPHY (N/A) as a surgical intervention.  The patient's history has been reviewed, patient examined, no change in status, stable for surgery.  I have reviewed the patient's chart and labs.  Questions were answered to the patient's satisfaction.    Cath Lab Visit (complete for each Cath Lab visit)  Clinical Evaluation Leading to the Procedure:   ACS: Yes.    Non-ACS:  N/A  Kaitland Lewellyn

## 2018-06-21 ENCOUNTER — Encounter: Payer: 59 | Admitting: Gastroenterology

## 2018-06-21 DIAGNOSIS — I25110 Atherosclerotic heart disease of native coronary artery with unstable angina pectoris: Secondary | ICD-10-CM | POA: Diagnosis present

## 2018-06-21 LAB — BASIC METABOLIC PANEL
Anion gap: 17 — ABNORMAL HIGH (ref 5–15)
BUN: 45 mg/dL — ABNORMAL HIGH (ref 6–20)
CO2: 23 mmol/L (ref 22–32)
Calcium: 8.4 mg/dL — ABNORMAL LOW (ref 8.9–10.3)
Chloride: 100 mmol/L (ref 98–111)
Creatinine, Ser: 11.77 mg/dL — ABNORMAL HIGH (ref 0.44–1.00)
GFR calc Af Amer: 4 mL/min — ABNORMAL LOW (ref >60)
GFR calc non Af Amer: 3 mL/min — ABNORMAL LOW (ref >60)
Glucose, Bld: 122 mg/dL — ABNORMAL HIGH (ref 70–99)
Potassium: 3.5 mmol/L (ref 3.5–5.1)
Sodium: 140 mmol/L (ref 135–145)

## 2018-06-21 LAB — CBC
HCT: 32 % — ABNORMAL LOW (ref 36.0–46.0)
Hemoglobin: 10.3 g/dL — ABNORMAL LOW (ref 12.0–15.0)
MCH: 30.7 pg (ref 26.0–34.0)
MCHC: 32.2 g/dL (ref 30.0–36.0)
MCV: 95.2 fL (ref 80.0–100.0)
Platelets: 290 10*3/uL (ref 150–400)
RBC: 3.36 MIL/uL — ABNORMAL LOW (ref 3.87–5.11)
RDW: 14.9 % (ref 11.5–15.5)
WBC: 5.5 10*3/uL (ref 4.0–10.5)
nRBC: 0 % (ref 0.0–0.2)

## 2018-06-21 LAB — GLUCOSE, CAPILLARY
Glucose-Capillary: 134 mg/dL — ABNORMAL HIGH (ref 70–99)
Glucose-Capillary: 109 mg/dL — ABNORMAL HIGH (ref 70–99)
Glucose-Capillary: 134 mg/dL — ABNORMAL HIGH (ref 70–99)

## 2018-06-21 MED ORDER — ISOSORBIDE MONONITRATE ER 60 MG PO TB24: 60.0000 mg | ORAL_TABLET | Freq: Every day | ORAL | 0 refills | Status: DC

## 2018-06-21 MED ORDER — NITROGLYCERIN 0.4 MG SL SUBL
SUBLINGUAL_TABLET | SUBLINGUAL | Status: AC
  Administered 2018-06-21: 0.4 mg
  Filled 2018-06-21: qty 1

## 2018-06-21 MED ORDER — ISOSORBIDE MONONITRATE ER 60 MG PO TB24
60.0000 mg | ORAL_TABLET | Freq: Every day | ORAL | Status: DC
  Administered 2018-06-21: 60 mg via ORAL
  Filled 2018-06-21: qty 1

## 2018-06-21 MED ORDER — NITROGLYCERIN 0.4 MG SL SUBL: 0.4000 mg | SUBLINGUAL_TABLET | SUBLINGUAL | 0 refills | Status: DC | PRN

## 2018-06-21 MED ORDER — NITROGLYCERIN 0.4 MG SL SUBL
0.4000 mg | SUBLINGUAL_TABLET | SUBLINGUAL | Status: DC | PRN
  Administered 2018-06-21: 0.4 mg via SUBLINGUAL

## 2018-06-21 NOTE — Progress Notes (Signed)
[facility] PROGRESS NOTE  Brenda Davis CLE:[id number] DOB: March 22, 1965 DOA: 06/18/2018 PCP: [name], MD  Assessment/Plan: 1. NSTEMI. Nonobstructive multivessel CAD on LHC, started imdur 30 mg will increase to 60 mg given recurrent chest pain/angina overnight and BP can tolerate. Will repeat EKG this am, (EKG reviewed showed maybe ST change in V2). No current chest pain, cardiology following.  2. ESRD on PD.  Denies any abdominal pain.  Nephrology on board and assisting with volume status 3. Hypokalemia. Improved with repletion 4. Anemia of CKD. Hgb stable at baseline 5. Type 2 diabetes.  A1c 6.4%, on no home medications 6. Hyperlipidemia, Lipitor 80 mg 7. Hypertension. Since cath much better control. Also on imdur now 8. Prolonged QT. Still prolonged on repeat at 531. Avoid prolonging agents  Code Status: Full code Family Communication: No family at bedside (indicate person spoken with, relationship, and if by phone, the number) Disposition Plan: monitor for recurrent chest pain, increased imdur, ambulate in halls, possible dc in afternoon if EKG is nonischemic and doing well.    Consultants:  Cardiology, nephrology  Procedures:  None  Antibiotics:  None (indicate start date, and stop date if known)  HPI/Subjective:  Brenda Davis is a 53 y.o. year old female with medical history significant for ESRD on PD, DM2, CAD with moderate non-occlusive triple vessel disease on LHC in Feb this year  who presented on 06/18/2018 with chest pain that initially began this past Thursday and again occurred the day prior to admission described as occurring in the center of her chest with radiation to her shoulders with associated nausea that was 10 out of 10 and eventually subsided after 45 minutes (previously she had taken aspirin with minimal relief)   and was found to have significantly elevated troponin concerning for NSTEMI.  Reports similar chest pain to  when she was admitted but not as intensethis morning around 2 or 3 am that resolved after given nitro x 2 EKG was obtained around 6 am-- on my review V2 looks like ST is slightly elevated  Objective: Vitals:   06/21/18 0422 06/21/18 0740  BP: 109/61 (!) 143/81  Pulse: 82 72  Resp: 20 18  Temp: 97.8 F (36.6 C) 98.6 F (37 C)  SpO2: 100% 100%    Intake/Output Summary (Last 24 hours) at 06/21/2018 0802 Last data filed at 06/20/2018 1758 Gross per 24 hour  Intake 585 ml  Output -  Net 585 ml   Filed Weights   06/20/18 1717 06/21/18 0422 06/21/18 0740  Weight: 117.9 kg 117.3 kg 117.2 kg    Exam:   General: Middle age female, in no distress  Cardiovascular: Regular rate and rhythm, no edema  Respiratory: Normal respiratory effort on room air  Abdomen: Soft, nondistended, nontender, PD catheter site in place, no surrounding drainage or erythema  Musculoskeletal: Normal range of motion  Skin no rashes or lesions  Neurologic alert oriented x4, no appreciable focal deficits  Data Reviewed: Basic Metabolic Panel: Recent Labs  Lab 06/18/18 2033 06/19/18 0534 06/20/18 0559 06/21/18 0411  NA 137 142 137 140  K 3.1* 3.4* 2.8* 3.5  CL 97* 101 99 100  CO2 22 21* 21* 23  GLUCOSE 153* 96 151* 122*  BUN 47* 50* 49* 45*  CREATININE 11.83* 12.15* 12.36* 11.77*  CALCIUM 7.6* 7.6* 7.7* 8.4*   Liver Function Tests: Recent Labs  Lab 06/18/18 2314  AST 29  ALT 22  ALKPHOS 98  BILITOT 0.7  PROT 6.4*  ALBUMIN 2.9*   No results for input(s): LIPASE, AMYLASE in the last 168 hours. No results for input(s): AMMONIA in the last 168 hours. CBC: Recent Labs  Lab 06/18/18 2033 06/20/18 0559 06/21/18 0411  WBC 9.0 6.8 5.5  HGB 9.8* 10.5* 10.3*  HCT 30.6* 31.8* 32.0*  MCV 96.2 94.4 95.2  PLT 287 261 290   Cardiac Enzymes: Recent Labs  Lab 06/18/18 2033 06/18/18 2302 06/19/18 0534 06/19/18 1109  TROPONINI 0.08* 0.23* 1.04* 0.99*   BNP (last 3 results) Recent Labs     02/24/18 1027 06/18/18 2314  BNP 36.5 110.2*    ProBNP (last 3 results) No results for input(s): PROBNP in the last 8760 hours.  CBG: Recent Labs  Lab 06/20/18 1756 06/20/18 2005 06/20/18 2347 06/21/18 0440 06/21/18 0729  GLUCAP 143* 251* 127* 134* 109*    Recent Results (from the past 240 hour(s))  SARS Coronavirus 2 (CEPHEID - Performed in [facility] hospital lab), Hosp Order     Status: None   Collection Time: 06/18/18 11:14 PM  Result Value Ref Range Status   SARS Coronavirus 2 NEGATIVE NEGATIVE Final    Comment: (NOTE) If result is NEGATIVE SARS-CoV-2 target nucleic acids are NOT DETECTED. The SARS-CoV-2 RNA is generally detectable in upper and lower  respiratory specimens during the acute phase of infection. The lowest  concentration of SARS-CoV-2 viral copies this assay can detect is 250  copies / mL. A negative result does not preclude SARS-CoV-2 infection  and should not be used as the sole basis for treatment or other  patient management decisions.  A negative result may occur with  improper specimen collection / handling, submission of specimen other  than nasopharyngeal swab, presence of viral mutation(s) within the  areas targeted by this assay, and inadequate number of viral copies  (<250 copies / mL). A negative result must be combined with clinical  observations, patient history, and epidemiological information. If result is POSITIVE SARS-CoV-2 target nucleic acids are DETECTED. The SARS-CoV-2 RNA is generally detectable in upper and lower  respiratory specimens dur ing the acute phase of infection.  Positive  results are indicative of active infection with SARS-CoV-2.  Clinical  correlation with patient history and other diagnostic information is  necessary to determine patient infection status.  Positive results do  not rule out bacterial infection or co-infection with other viruses. If result is PRESUMPTIVE POSTIVE SARS-CoV-2 nucleic acids MAY  BE PRESENT.   A presumptive positive result was obtained on the submitted specimen  and confirmed on repeat testing.  While 2019 novel coronavirus  (SARS-CoV-2) nucleic acids may be present in the submitted sample  additional confirmatory testing may be necessary for epidemiological  and / or clinical management purposes  to differentiate between  SARS-CoV-2 and other Sarbecovirus currently known to infect humans.  If clinically indicated additional testing with an alternate test  methodology [phone]) is advised. The SARS-CoV-2 RNA is generally  detectable in upper and lower respiratory sp ecimens during the acute  phase of infection. The expected result is Negative. Fact Sheet for Patients:  StrictlyIdeas.no Fact Sheet for Healthcare Providers: BankingDealers.co.za This test is not yet approved or cleared by the [place] FDA and has been authorized for detection and/or diagnosis of SARS-CoV-2 by FDA under an Emergency Use Authorization (EUA).  This EUA will remain in effect (meaning this test can be used) for the duration of the COVID-19 declaration under Section 564(b)(1) of the Act, 21 U.S.C. section 360bbb-3(b)(1), unless the authorization  is terminated or revoked sooner. Performed at [facility], [place] [address], [place], [place] [address]      Studies: No results found.  Scheduled Meds: . aspirin EC  81 mg Oral Daily  . atorvastatin  80 mg Oral q1800  . calcium acetate  667 mg Oral TID WC  . ferric citrate  210 mg Oral TID WC  . folic acid  1 mg Oral Daily  . gentamicin cream  1 application Topical Daily  . heparin  5,000 Units Subcutaneous Q8H  . insulin aspart  0-9 Units Subcutaneous Q4H  . isosorbide mononitrate  60 mg Oral Daily  . sodium chloride flush  3 mL Intravenous Q12H  . sodium chloride flush  3 mL Intravenous Q12H   Continuous Infusions: . sodium chloride    . dialysis solution 1.5%  low-MG/low-CA    . dialysis solution 2.5% low-MG/low-CA      Principal Problem:   Chest pain, rule out acute myocardial infarction Active Problems:   Hypokalemia   ESRD on peritoneal dialysis ([place])   DM (diabetes mellitus), type 2 with renal complications (H[facility])   Hepatic cirrhosis (HCC)   NSTEMI (non-ST elevated myocardial infarction) ([place])   CAD in native artery   Hypocalcemia      Desiree Hane  [facility]

## 2018-06-21 NOTE — Progress Notes (Signed)
Patient ID: Brenda Davis, female   DOB: [birth year], 53 y.o.   MRN: [id number]  [facility] Progress Note   Assessment/ Plan:   1.  Chest pain/non-ST elevation MI: Coronary angiography yesterday showed moderately severe diffuse coronary artery disease without target for PCI-medical management recommended.  Overnight with recurrent substernal chest pain managed medically.  Additional management/disposition per cardiology recommendations 2. ESRD: Continue CCPD at current prescription-appears to be euvolemic on physical exam with acceptable labs. 3. Anemia: Without overt loss, continue to monitor for ESA dosing. 4. CKD-MBD: On calcium acetate and Auryxia for phosphorus binding.  Will follow phosphorus levels.  Not on VDR A. 5. Nutrition: Continue renal diet/nutritional supplementation. 6. Hypertension: Blood pressure marginally elevated, monitor with ultrafiltration/PD.  Subjective:   Reports severe substernal chest pain with diaphoresis and dizziness last night alleviated by sublingual nitroglycerin.   Objective:   BP (!) 143/81 (BP Location: Right Arm)   Pulse 72   Temp 98.6 F (37 C) (Oral)   Resp 18   Ht 5\' 5"  (1.651 m)   Wt 117.2 kg   SpO2 100%   BMI 43.00 kg/m   Physical Exam: Gen: Resting comfortably in bed, talking on cell phone CVS: Pulse regular rhythm, normal rate, S1 and S2 normal Resp: Diminished breath sounds over bases, clear to auscultation over upper lung fields.  No rales/rhonchi Abd: Soft, obese, nontender.  PD catheter in situ Ext: No lower extremity edema  Labs: BMET Recent Labs  Lab 06/18/18 2033 06/19/18 0534 06/20/18 0559 06/21/18 0411  NA 137 142 137 140  K 3.1* 3.4* 2.8* 3.5  CL 97* 101 99 100  CO2 22 21* 21* 23  GLUCOSE 153* 96 151* 122*  BUN 47* 50* 49* 45*  CREATININE 11.83* 12.15* 12.36* 11.77*  CALCIUM 7.6* 7.6* 7.7* 8.4*   CBC Recent Labs  Lab 06/18/18 2033 06/20/18 0559 06/21/18 0411  WBC 9.0 6.8 5.5   HGB 9.8* 10.5* 10.3*  HCT 30.6* 31.8* 32.0*  MCV 96.2 94.4 95.2  PLT 287 261 290   Medications:    . aspirin EC  81 mg Oral Daily  . atorvastatin  80 mg Oral q1800  . calcium acetate  667 mg Oral TID WC  . ferric citrate  210 mg Oral TID WC  . folic acid  1 mg Oral Daily  . gentamicin cream  1 application Topical Daily  . heparin  5,000 Units Subcutaneous Q8H  . insulin aspart  0-9 Units Subcutaneous Q4H  . isosorbide mononitrate  60 mg Oral Daily  . sodium chloride flush  3 mL Intravenous Q12H  . sodium chloride flush  3 mL Intravenous Q12H   Elmarie Shiley, MD 06/21/2018, 8:21 AM

## 2018-06-21 NOTE — Discharge Summary (Signed)
Discharge Summary  Brenda Davis DJM:[id number] DOB: 10-19-1965  PCP: [name], MD  Admit date: 06/18/2018 Discharge date: 06/22/2018   Time spent: < 25 minutes  Admitted From: Home Disposition: Home  Recommendations for Outpatient Follow-up:  1. Follow up with PCP in 1 week. BP check consider addition of carvedilol 2. Medications: Imdur 60 mg, atorvastatin 80 mg, aspirin 3.     Discharge Diagnoses:  Active Hospital Problems   Diagnosis Date Noted  . Chest pain, rule out acute myocardial infarction 06/18/2018  . Coronary artery disease involving native coronary artery of native heart with unstable angina pectoris ([place])   . Hypocalcemia 06/18/2018  . CAD in native artery   . NSTEMI (non-ST elevated myocardial infarction) ([place])   . Hepatic cirrhosis ([place]) 11/24/2017  . DM (diabetes mellitus), type 2 with renal complications ([place]) 22/29/7989  . ESRD on peritoneal dialysis ([place]) 08/25/2017  . Hypokalemia 11/28/2012    Resolved Hospital Problems  No resolved problems to display.    Discharge Condition: Stable  CODE STATUS: Full code  History of present illness:  Brenda Davis is a 53 y.o. year old female with medical history significant for ESRD on PD, DM2, CAD with moderate non-occlusive triple vessel disease on LHC in Feb this year  who presented on 06/18/2018 with chest pain that initially began this past Thursday and again occurred the day prior to admission described as occurring in the center of her chest with radiation to her shoulders with associated nausea that was 10 out of 10 and eventually subsided after 45 minutes (previously she had taken aspirin with minimal relief)   and was found to have significantly elevated troponin concerning for NSTEMI. Remaining hospital course addressed in problem based format below:   Hospital Course:   NSTEMI.  Presented with chest pain and known history of CAD with recent cath February 2020 showing  nonobstructive disease.  Found to have elevated troponin here 1.04 nonischemic EKG.  Underwent cath on Jun 30, 2022 which showed no clear culprit for PCI ( moderate severe multivessel CAD no significant change from prior), suspect elevated troponin in setting of demand ischemia (supply demand mismatch) per cardiology.  Had brief recurrence of chest pain after cath that improved with increase of Imdur from 30 to 60 mg, will repeat EKG shows no new changes prior to discharge.  We will continue this on discharge addition to aspirin and atorvastatin. ESRD on PD.  Stable continued PD sessions during hospital stay Hypokalemia. Improved with oral repletion Anemia of CKD: hgb remained stable at baseline Type 2 diabetes: A1c 6.4%, no home medications HLD: discharged on atorvastatin 80 mg given NSTEMI HTN:SBP slightly elevated at 130s to 140s.  Cardiology recommended adding carvedilol, discussed with PCP on follow-up.   Prolonged QTC. 521 on last EKG prior to discharge. Avoid prolonging agents   Consultations:  Nephrology, cardiology  Procedures/Studies: Cardiac catheterization 06/30/2018:  Conclusions: 1. Moderately severe multivessel coronary artery disease not significantly changed since prior catheterization in 02/2018. DFR of the most severe lesion in the RCA at that time was not hemodynamically significant. 2. Normal left ventricular filling pressure. Hyperdynamic left ventricular contraction.   Discharge Exam: BP 135/89 (BP Location: Right Arm)   Pulse 74   Temp 98.3 F (36.8 C) (Oral)   Resp 18   Ht 5' 5"  (1.651 m)   Wt 117.2 kg   SpO2 100%   BMI 43.00 kg/m    General: Middle age female, in no distress  Cardiovascular: Regular rate and  rhythm, no edema  Respiratory: Normal respiratory effort on room air  Abdomen: Soft, nondistended, nontender, PD catheter site in place, no surrounding drainage or erythema  Musculoskeletal: Normal range of motion  Skin no rashes or lesions   Neurologic alert oriented x4, no appreciable focal deficits  Discharge Instructions You were cared for by a hospitalist during your hospital stay. If you have any questions about your discharge medications or the care you received while you were in the hospital after you are discharged, you can call the unit and asked to speak with the hospitalist on call if the hospitalist that took care of you is not available. Once you are discharged, your primary care physician will handle any further medical issues. Please note that NO REFILLS for any discharge medications will be authorized once you are discharged, as it is imperative that you return to your primary care physician (or establish a relationship with a primary care physician if you do not have one) for your aftercare needs so that they can reassess your need for medications and monitor your lab values.  Discharge Instructions    Diet - low sodium heart healthy   Complete by:  As directed    Increase activity slowly   Complete by:  As directed      Allergies as of 06/21/2018      Reactions   Amlodipine Swelling   Clonidine Derivatives Swelling   Limbs swell   Doxycycline Nausea And Vomiting   "I threw up for 3 hours"   Welchol [colesevelam Hcl] Nausea Only      Medication List    TAKE these medications   acetaminophen 325 MG tablet Commonly known as:  TYLENOL Take 650 mg by mouth every 6 (six) hours as needed for mild pain.   aspirin 81 MG EC tablet Take 1 tablet (81 mg total) by mouth daily.   atorvastatin 80 MG tablet Commonly known as:  LIPITOR Take 1 tablet (80 mg total) by mouth daily at 6 PM.   Auryxia 1 GM 210 MG(Fe) tablet Generic drug:  ferric citrate Take 210 mg by mouth 3 (three) times daily with meals.   B-12 PO Take 1 tablet by mouth daily.   calcium acetate 667 MG capsule Commonly known as:  PHOSLO Take 667 mg by mouth 3 (three) times daily with meals.   cinacalcet 30 MG tablet Commonly known as:   SENSIPAR Take 1 tablet (30 mg total) by mouth daily with supper.   folic acid 1 MG tablet Commonly known as:  FOLVITE Take 1 tablet (1 mg total) by mouth daily.   gentamicin cream 0.1 % Commonly known as:  GARAMYCIN Apply 1 application topically See admin instructions. Apply to exit site daily as directed.   hydrOXYzine 10 MG tablet Commonly known as:  ATARAX/VISTARIL Take 10 mg by mouth 2 (two) times daily as needed for itching.   isosorbide mononitrate 60 MG 24 hr tablet Commonly known as:  IMDUR Take 1 tablet (60 mg total) by mouth daily.   nitroGLYCERIN 0.4 MG SL tablet Commonly known as:  NITROSTAT Place 1 tablet (0.4 mg total) under the tongue every 5 (five) minutes as needed for chest pain.   ondansetron 8 MG disintegrating tablet Commonly known as:  Zofran ODT Take 1 tablet (8 mg total) by mouth every 8 (eight) hours as needed for nausea or vomiting.      Allergies  Allergen Reactions  . Amlodipine Swelling  . Clonidine Derivatives Swelling    Limbs  swell  . Doxycycline Nausea And Vomiting    "I threw up for 3 hours"  . Welchol [Colesevelam Hcl] Nausea Only   Follow-up Information    Lendon Colonel, NP Follow up on 07/11/2018.   Specialties:  Nurse Practitioner, Radiology, Cardiology Why:  Your follow up visit will be on 07/11/2018 at 10am. This will be a virtual visit.  Contact information: [address] [place] [address] [phone]            The results of significant diagnostics from this hospitalization (including imaging, microbiology, ancillary and laboratory) are listed below for reference.    Significant Diagnostic Studies: Dg Chest 2 View  Result Date: 06/18/2018 CLINICAL DATA:  Mid chest pain. EXAM: CHEST - 2 VIEW COMPARISON:  April 19, 2018 FINDINGS: The heart size and mediastinal contours are within normal limits. Both lungs are clear. The visualized skeletal structures are unremarkable. IMPRESSION: No active  cardiopulmonary disease. Electronically Signed   By: Dorise Bullion III M.D   On: 06/18/2018 20:47   [place] Limited Joint Space Structures Up Left  Result Date: 06/12/2018 No images saved    Microbiology: Recent Results (from the past 240 hour(s))  SARS Coronavirus 2 (CEPHEID - Performed in [facility] hospital lab), Hosp Order     Status: None   Collection Time: 06/18/18 11:14 PM  Result Value Ref Range Status   SARS Coronavirus 2 NEGATIVE NEGATIVE Final    Comment: (NOTE) If result is NEGATIVE SARS-CoV-2 target nucleic acids are NOT DETECTED. The SARS-CoV-2 RNA is generally detectable in upper and lower  respiratory specimens during the acute phase of infection. The lowest  concentration of SARS-CoV-2 viral copies this assay can detect is 250  copies / mL. A negative result does not preclude SARS-CoV-2 infection  and should not be used as the sole basis for treatment or other  patient management decisions.  A negative result may occur with  improper specimen collection / handling, submission of specimen other  than nasopharyngeal swab, presence of viral mutation(s) within the  areas targeted by this assay, and inadequate number of viral copies  (<250 copies / mL). A negative result must be combined with clinical  observations, patient history, and epidemiological information. If result is POSITIVE SARS-CoV-2 target nucleic acids are DETECTED. The SARS-CoV-2 RNA is generally detectable in upper and lower  respiratory specimens dur ing the acute phase of infection.  Positive  results are indicative of active infection with SARS-CoV-2.  Clinical  correlation with patient history and other diagnostic information is  necessary to determine patient infection status.  Positive results do  not rule out bacterial infection or co-infection with other viruses. If result is PRESUMPTIVE POSTIVE SARS-CoV-2 nucleic acids MAY BE PRESENT.   A presumptive positive result was obtained on the  submitted specimen  and confirmed on repeat testing.  While 2019 novel coronavirus  (SARS-CoV-2) nucleic acids may be present in the submitted sample  additional confirmatory testing may be necessary for epidemiological  and / or clinical management purposes  to differentiate between  SARS-CoV-2 and other Sarbecovirus currently known to infect humans.  If clinically indicated additional testing with an alternate test  methodology [phone]) is advised. The SARS-CoV-2 RNA is generally  detectable in upper and lower respiratory sp ecimens during the acute  phase of infection. The expected result is Negative. Fact Sheet for Patients:  StrictlyIdeas.no Fact Sheet for Healthcare Providers: BankingDealers.co.za This test is not yet approved or cleared by the [place]  FDA and has been authorized for detection and/or diagnosis of SARS-CoV-2 by FDA under an Emergency Use Authorization (EUA).  This EUA will remain in effect (meaning this test can be used) for the duration of the COVID-19 declaration under Section 564(b)(1) of the Act, 21 U.S.C. section 360bbb-3(b)(1), unless the authorization is terminated or revoked sooner. Performed at [facility], [place] [address], [place], [place] [address]      Labs: Basic Metabolic Panel: Recent Labs  Lab 06/18/18 2033 06/19/18 0534 06/20/18 0559 06/21/18 0411  NA 137 142 137 140  K 3.1* 3.4* 2.8* 3.5  CL 97* 101 99 100  CO2 22 21* 21* 23  GLUCOSE 153* 96 151* 122*  BUN 47* 50* 49* 45*  CREATININE 11.83* 12.15* 12.36* 11.77*  CALCIUM 7.6* 7.6* 7.7* 8.4*   Liver Function Tests: Recent Labs  Lab 06/18/18 2314  AST 29  ALT 22  ALKPHOS 98  BILITOT 0.7  PROT 6.4*  ALBUMIN 2.9*   No results for input(s): LIPASE, AMYLASE in the last 168 hours. No results for input(s): AMMONIA in the last 168 hours. CBC: Recent Labs  Lab 06/18/18 2033 06/20/18 0559 06/21/18 0411  WBC 9.0 6.8 5.5   HGB 9.8* 10.5* 10.3*  HCT 30.6* 31.8* 32.0*  MCV 96.2 94.4 95.2  PLT 287 261 290   Cardiac Enzymes: Recent Labs  Lab 06/18/18 2033 06/18/18 2302 06/19/18 0534 06/19/18 1109  TROPONINI 0.08* 0.23* 1.04* 0.99*   BNP: BNP (last 3 results) Recent Labs    02/24/18 1027 06/18/18 2314  BNP 36.5 110.2*    ProBNP (last 3 results) No results for input(s): PROBNP in the last 8760 hours.  CBG: Recent Labs  Lab 06/20/18 2005 06/20/18 2347 06/21/18 0440 06/21/18 0729 06/21/18 1210  GLUCAP 251* 127* 134* 109* 134*       Signed:  Desiree Hane, MD [facility] 06/22/2018, 12:20 PM

## 2018-06-21 NOTE — Progress Notes (Signed)
Pt ambulated in hallway about 400 ft without c/o SOB or chest pain. Denied chest pain this shift.  Idolina Primer, RN

## 2018-06-21 NOTE — Progress Notes (Signed)
Pt c/o severe chest pain.  Stated pain is under left breast. Also c/o headache and nausea. Administered Maalox, tylenol and zofran with good effect.  Mancel Bale, NP was made aware.  Idolina Primer, RN

## 2018-06-21 NOTE — Progress Notes (Signed)
Inpatient Diabetes Program Recommendations  AACE/ADA: New Consensus Statement on Inpatient Glycemic Control (2015)  Target Ranges:  Prepandial:   less than 140 mg/dL      Peak postprandial:   less than 180 mg/dL (1-2 hours)      Critically ill patients:  140 - 180 mg/dL   Lab Results  Component Value Date   GLUCAP 109 (H) 06/21/2018   HGBA1C 6.6 (A) 03/07/2018   Results for Brenda Davis, Brenda Davis (MRN [id number]) as of 06/21/2018 09:42  Ref. Range 06/20/2018 00:16 06/20/2018 04:01 06/20/2018 07:47 06/20/2018 11:56 06/20/2018 13:21 06/20/2018 17:56 06/20/2018 20:05 06/20/2018 23:47 06/21/2018 04:40 06/21/2018 07:29  Glucose-Capillary Latest Ref Range: 70 - 99 mg/dL 120 (H) 123 (H) 134 (H) 87 118 (H) 143 (H) 251 (H) 127 (H) 134 (H) 109 (H)        Review of Glycemic Control  Diabetes history: DM2 Outpatient Diabetes medications: none Current orders for Inpatient glycemic control: Novolog sensitive correction  (0-9 units) Q4   ESRD on PD.    As patient is eating would recommend decreasing CBG checks/Novolog correction to TID AC.    MD please consider the following inpatient insulin recs:  1. Decrease Novolog sensitive (0-9 units) correction frequency to TID AC  2. Add "carb modified" to current diet order    Thanks.   -- Will follow during hospitalization.--  Jonna Clark RN, MSN Diabetes Coordinator Inpatient Glycemic Control Team Team Pager: [phone] (8am-5pm)

## 2018-06-21 NOTE — Progress Notes (Addendum)
Progress Note  Patient Name: Brenda Davis Date of Encounter: 06/21/2018  Primary Cardiologist: Skeet Latch, MD  Subjective   Had recurrent chest pain overnight. Given SL NTG with relief. Repeat EKG this AM, will trend trop. Comfortable now. IM increased Imdur to 14m QD  Inpatient Medications    Scheduled Meds: . aspirin EC  81 mg Oral Daily  . atorvastatin  80 mg Oral q1800  . calcium acetate  667 mg Oral TID WC  . ferric citrate  210 mg Oral TID WC  . folic acid  1 mg Oral Daily  . gentamicin cream  1 application Topical Daily  . heparin  5,000 Units Subcutaneous Q8H  . insulin aspart  0-9 Units Subcutaneous Q4H  . isosorbide mononitrate  30 mg Oral Daily  . sodium chloride flush  3 mL Intravenous Q12H  . sodium chloride flush  3 mL Intravenous Q12H   Continuous Infusions: . sodium chloride    . dialysis solution 1.5% low-MG/low-CA    . dialysis solution 2.5% low-MG/low-CA     PRN Meds: sodium chloride, acetaminophen, alum & mag hydroxide-simeth, hydrOXYzine, nitroGLYCERIN, ondansetron (ZOFRAN) IV, ondansetron, sodium chloride flush   Vital Signs    Vitals:   06/21/18 0222 06/21/18 0229 06/21/18 0242 06/21/18 0422  BP: (!) 144/79 140/89 133/89 109/61  Pulse:    82  Resp:    20  Temp:    97.8 F (36.6 C)  TempSrc:    Oral  SpO2:    100%  Weight:    117.3 kg  Height:        Intake/Output Summary (Last 24 hours) at 06/21/2018 0738 Last data filed at 06/20/2018 1758 Gross per 24 hour  Intake 585 ml  Output -  Net 585 ml   Filed Weights   06/20/18 0407 06/20/18 1717 06/21/18 0422  Weight: 121.7 kg 117.9 kg 117.3 kg    Physical Exam   General: Well developed, well nourished, NAD Skin: Warm, dry, intact  Head: Normocephalic, atraumatic, clear, moist mucus membranes. Neck: Negative for carotid bruits. No JVD Lungs: Clear to ausculation bilaterally. No wheezes, rales, or rhonchi. Breathing is unlabored. Cardiovascular: RRR with S1 S2.  No murmur Abdomen: Soft, non-tender, non-distended. No obvious abdominal masses. MSK: Strength and tone appear normal for age. 5/5 in all extremities Extremities: No edema. No clubbing or cyanosis. DP/PT pulses 2+ bilaterally Neuro: Alert and oriented. No focal deficits. No facial asymmetry. MAE spontaneously. Psych: Responds to questions appropriately with normal affect.    Labs    Chemistry Recent Labs  Lab 06/18/18 2314 06/19/18 0534 06/20/18 0559 06/21/18 0411  NA  --  142 137 140  K  --  3.4* 2.8* 3.5  CL  --  101 99 100  CO2  --  21* 21* 23  GLUCOSE  --  96 151* 122*  BUN  --  50* 49* 45*  CREATININE  --  12.15* 12.36* 11.77*  CALCIUM  --  7.6* 7.7* 8.4*  PROT 6.4*  --   --   --   ALBUMIN 2.9*  --   --   --   AST 29  --   --   --   ALT 22  --   --   --   ALKPHOS 98  --   --   --   BILITOT 0.7  --   --   --   GFRNONAA  --  3* 3* 3*  GFRAA  --  4*  4* 4*  ANIONGAP  --  20* 17* 17*     Hematology Recent Labs  Lab 06/18/18 2033 06/20/18 0559 06/21/18 0411  WBC 9.0 6.8 5.5  RBC 3.18* 3.37* 3.36*  HGB 9.8* 10.5* 10.3*  HCT 30.6* 31.8* 32.0*  MCV 96.2 94.4 95.2  MCH 30.8 31.2 30.7  MCHC 32.0 33.0 32.2  RDW 15.2 15.0 14.9  PLT 287 261 290    Cardiac Enzymes Recent Labs  Lab 06/18/18 2033 06/18/18 2302 06/19/18 0534 06/19/18 1109  TROPONINI 0.08* 0.23* 1.04* 0.99*   No results for input(s): TROPIPOC in the last 168 hours.   BNP Recent Labs  Lab 06/18/18 2314  BNP 110.2*     DDimer No results for input(s): DDIMER in the last 168 hours.   Radiology    No results found.  Telemetry    06/21/2018 NSR - Personally Reviewed  ECG    06/21/2018 NSR with inverted P waves in V3, artifact - Personally Reviewed  Cardiac Studies   Cardiac catheterization 06/20/2018:  Conclusions: 1. Moderately severe multivessel coronary artery disease not significantly changed since prior catheterization in 02/2018.  DFR of the most severe lesion in the RCA at that  time was not hemodynamically significant. 2. Normal left ventricular filling pressure. 3. Hyperdynamic left ventricular contraction.  Recommendations: 1. Medical therapy for moderate diffuse coronary artery disease.  There is no clear culprit for PCI.  I suspect troponin elevation reflects supply-demand mismatch.  I will start isosorbide mononitrate 30 mg daily, which can be escalated as blood pressure tolerates for relief of chest pain. 2. Aggressive secondary prevention, including high-intensity statin therapy. 3. If no evidence of bleeding complications and no recurrence of chest pain, discharge this afternoon or tomorrow could be considered from a cardiac standpoint.  Patient Profile     53 y.o. female with h/o CAD and ESRD admitted with unstable angina/NSTEMI. Cardiac cath in February 2020 with moderate RCA disease (DFR 0.95 so no PCI performed).   Assessment & Plan    1.  History of CAD who presented with non-STEMI: -Cardiac cath performed 06/20/2018 with medical therapy recommended for moderate diffuse coronary artery disease.  There was no clear culprit for PCI and suspect troponin elevation in the setting of demand ischemia -Imdur 30 mg daily initiated however this was increased by IM given recurrent CP overnight>>now relieved. -EKG with NSR with no acute changes, QTC improved to 472>>repeat EKG this AM  -Peak troponin 1.04 -Recurrent symptoms overnight, less severe than prior episode>>relieved with SL NTG -Continue ASA, atorvastatin, Imdur 60   2.  Hypokalemia:  -Stabilized, K+ today 3.5  3.  ESRD: -Continue HD per primary team -Creatinine, 11.77  4. DM2: -HbA1c, 6.6 -Continue regimen per IM   5. Labile BP: -Varying BP>>143/81>109/61>133/89 -Add carvedilol for better BP control    Signed, Kathyrn Drown NP-C [facility] Pager: [phone] 06/21/2018, 7:38 AM     For questions or updates, please contact   Please consult [email or web address] for contact info under  Cardiology/STEMI.  I have personally seen and examined this patient. I agree with the assessment and plan as outlined above.  She has stable CAD. No culprit lesions for PCI. Agree with Imdur. No further recommendations.  Cardiology will sign off. Please call with questions.   Lauree Chandler 06/21/2018 8:39 AM

## 2018-06-22 ENCOUNTER — Telehealth: Payer: Self-pay | Admitting: *Deleted

## 2018-06-22 NOTE — Telephone Encounter (Signed)
Pt was on TCM report admitted for observation 06/19/18 for Chest Pain to rule out acute myocardial infarction. The pain located in central chest with radiation to right arm.  No abd pain, no abd tenderness. Pt did have some nausea but no vomiting. Pt D/c 06/21/18, and will follow=up w/cardiology 07/11/18.Marland KitchenJohny Chess

## 2018-07-06 ENCOUNTER — Telehealth: Payer: Self-pay | Admitting: Adult Health

## 2018-07-06 NOTE — Telephone Encounter (Signed)
smartphone/ consent/ my chart/ pre reg completed °

## 2018-07-08 ENCOUNTER — Other Ambulatory Visit: Payer: Self-pay | Admitting: *Deleted

## 2018-07-08 MED ORDER — NITROGLYCERIN 0.4 MG SL SUBL: 0.4000 mg | SUBLINGUAL_TABLET | SUBLINGUAL | 0 refills | Status: DC | PRN

## 2018-07-09 NOTE — Progress Notes (Signed)
Virtual Visit via Video Note   This visit type was conducted due to national recommendations for restrictions regarding the COVID-19 Pandemic (e.g. social distancing) in an effort to limit this patient's exposure and mitigate transmission in our community.  Due to her co-morbid illnesses, this patient is at least at moderate risk for complications without adequate follow up.  This format is felt to be most appropriate for this patient at this time.  All issues noted in this document were discussed and addressed.  A limited physical exam was performed with this format.  Please refer to the patient's chart for her consent to telehealth for [facility].   Date:  07/11/2018   ID:  MEREDETH Davis, DOB 06/27/1965, MRN [id number]  Patient Location: Home Provider Location: Office  PCP:  [name], MD  Cardiologist:  Skeet Latch, MD  Electrophysiologist:  None   Evaluation Performed:  Follow-Up Visit  Chief Complaint: Posthospitalization follow-up after admission for chest pain.  History of Present Illness:    Brenda Davis is a 53 y.o. female with recent admission on 06/20/2018 for complaints of chest pain, NSTEMI,  with known history of coronary artery disease, multivessel.  The patient had cardiac catheterization on 06/20/2018 which revealed moderately severe multivessel CAD not significantly changed since prior cath in 2020.  DFR of the most severe lesion in the RCA at the time was nonhemodynamically significant.  She was recommended for medical therapy as it was no clear culprit for PCI.  The patient was started on isosorbide mononitrate 30 mg daily and aggressive secondary prevention with high intensity statin therapy with atorvastatin 80 mg. .  Other history includes hepatic cirrhosis, diabetes type 2 with renal complications, ESRD on peritoneal dialysis, hypocalcemia and hypokalemia.  She has had recurrent chest pain but is unable to use isosorbide as this  causes her to have more chest pain and diaphoresis. She took it for 4 days and experienced this each day. She has stopped it on her own and feels much better. She did have one episode of chest pain while sewing. She took NTG and lied down,came back but not as intense. She took a second NTG and the pain subsided. After about 30 minutes she got up and went about her day  She wonders if there is anything else she may try.     The patient does not have symptoms concerning for COVID-19 infection (fever, chills, cough, or new shortness of breath).    Past Medical History:  Diagnosis Date  . Anemia   . CAD (coronary artery disease)    a. cath in 02/2018 showing moderate 3-vessel CAD with 45% mid-LADm 55% LCx and 65% RCA stenosis which was not significant by FFR  . ESRD on dialysis ([place])   . Gout   . HCAP (healthcare-associated pneumonia) 05/06/2016  . History of blood transfusion    "related to surgery"  . Hyperlipidemia   . Hypertension   . Morbid obesity ([place])   . Pain    LEFT SHOULDER PAIN - WAS SEEN AT AN URGENT CARE - GIVEN SLING FOR COMFORT AND TOLD ROM AS TOLERATED.  Marland Kitchen Palpitations 09/24/2016  . Type II diabetes mellitus ([place])    "gastric sleeve OR corrected this" (05/06/2016)   Past Surgical History:  Procedure Laterality Date  . AV FISTULA PLACEMENT Left 01/03/2015   Procedure: BRACHIAL CEPHALIC ARTERIOVENOUS  FISTULA CREATION LEFT ARM;  Surgeon: Angelia Mould, MD;  Location: [place];  Service: Vascular;  Laterality: Left;  .  CARPAL TUNNEL RELEASE Right   . [place]  . CHOLECYSTECTOMY  09/14/2017  . ESOPHAGOGASTRODUODENOSCOPY N/A 09/04/2012   Procedure: ESOPHAGOGASTRODUODENOSCOPY (EGD);  Surgeon: Shann Medal, MD;  Location: [facility];  Service: General;  Laterality: N/A;  PF  . ESOPHAGOGASTRODUODENOSCOPY (EGD) WITH ESOPHAGEAL DILATION N/A 09/29/2012   Procedure: ESOPHAGOGASTRODUODENOSCOPY (EGD) WITH ESOPHAGEAL DILATION;  Surgeon: Milus Banister, MD;   Location: [facility];  Service: Endoscopy;  Laterality: N/A;  . INSERTION OF DIALYSIS CATHETER N/A 01/03/2015   Procedure: INSERTION OF DIALYSIS CATHETER RIGHT INTERNAL JUGULAR;  Surgeon: Angelia Mould, MD;  Location: [place];  Service: Vascular;  Laterality: N/A;  . LAPAROSCOPIC GASTRIC SLEEVE RESECTION N/A 07/19/2012   Procedure: LAPAROSCOPIC SLEEVE GASTRECTOMY with EGD;  Surgeon: Madilyn Hook, DO;  Location: [facility];  Service: General;  Laterality: N/A;  laparoscopic sleeve gastrectomy with EGD  . LEFT HEART CATH AND CORONARY ANGIOGRAPHY N/A 02/25/2018   Procedure: LEFT HEART CATH AND CORONARY ANGIOGRAPHY;  Surgeon: Leonie Man, MD;  Location: [place] [facility];  Service: Cardiovascular;  Laterality: N/A;  . LEFT HEART CATH AND CORONARY ANGIOGRAPHY N/A 06/20/2018   Procedure: LEFT HEART CATH AND CORONARY ANGIOGRAPHY;  Surgeon: Nelva Bush, MD;  Location: [place] [facility];  Service: Cardiovascular;  Laterality: N/A;  . PERIPHERAL VASCULAR CATHETERIZATION Left 05/23/2015   Procedure: Nolon Stalls;  Surgeon: Conrad Bayview, MD;  Location: [place] [facility];  Service: Cardiovascular;  Laterality: Left;  upper aRM  . PERIPHERAL VASCULAR CATHETERIZATION Left 05/23/2015   Procedure: Peripheral Vascular Balloon Angioplasty;  Surgeon: Conrad , MD;  Location: [place] [facility];  Service: Cardiovascular;  Laterality: Left;  av fistula  . TUBAL LIGATION  1993  . UPPER GI ENDOSCOPY N/A 07/19/2012   Procedure: UPPER GI ENDOSCOPY;  Surgeon: Madilyn Hook, DO;  Location: [facility];  Service: General;  Laterality: N/A;     Current Meds  Medication Sig  . acetaminophen (TYLENOL) 325 MG tablet Take 650 mg by mouth every 6 (six) hours as needed for mild pain.  Marland Kitchen aspirin EC 81 MG EC tablet Take 1 tablet (81 mg total) by mouth daily.  Marland Kitchen atorvastatin (LIPITOR) 80 MG tablet Take 1 tablet (80 mg total) by mouth daily at 6 PM.  . AURYXIA 1 GM 210 MG(Fe) tablet Take 210 mg by mouth 3 (three) times daily with  meals.   . calcium acetate (PHOSLO) 667 MG capsule Take 667 mg by mouth 3 (three) times daily with meals.  . cinacalcet (SENSIPAR) 30 MG tablet Take 1 tablet (30 mg total) by mouth daily with supper.  . Cyanocobalamin (B-12 PO) Take 1 tablet by mouth daily.  . folic acid (FOLVITE) 1 MG tablet Take 1 tablet (1 mg total) by mouth daily.  Marland Kitchen gentamicin cream (GARAMYCIN) 0.1 % Apply 1 application topically See admin instructions. Apply to exit site daily as directed.  . hydrOXYzine (ATARAX/VISTARIL) 10 MG tablet Take 10 mg by mouth 2 (two) times daily as needed for itching.  . nitroGLYCERIN (NITROSTAT) 0.4 MG SL tablet Place 1 tablet (0.4 mg total) under the tongue every 5 (five) minutes as needed for chest pain.  Marland Kitchen ondansetron (ZOFRAN ODT) 8 MG disintegrating tablet Take 1 tablet (8 mg total) by mouth every 8 (eight) hours as needed for nausea or vomiting.     Allergies:   Amlodipine, Clonidine derivatives, Doxycycline, and Welchol [colesevelam hcl]   Social History   Tobacco Use  . Smoking status: Former Smoker  Packs/day: 1.00    Years: 16.00    Pack years: 16.00    Types: Cigarettes    Quit date: 2009    Years since quitting: 11.4  . Smokeless tobacco: Never Used  Substance Use Topics  . Alcohol use: Never    Frequency: Never  . Drug use: No     Family Hx: The patient's family history includes Arthritis in an other family member; Diabetes in her father; Hyperlipidemia in an other family member; Hypertension in her mother; Mitral valve prolapse in her mother. There is no history of Cancer, Heart disease, Kidney disease, or Stroke.  ROS:   Please see the history of present illness.   All other systems reviewed and are negative.   Prior CV studies:   The following studies were reviewed today: Cath 06/20/2018  1. Moderately severe multivessel coronary artery disease not significantly changed since prior catheterization in 02/2018.  DFR of the most severe lesion in the RCA at that  time was not hemodynamically significant. 2. Normal left ventricular filling pressure. 3. Hyperdynamic left ventricular contraction.  Recommendations: 1. Medical therapy for moderate diffuse coronary artery disease.  There is no clear culprit for PCI.  I suspect troponin elevation reflects supply-demand mismatch.  I will start isosorbide mononitrate 30 mg daily, which can be escalated as blood pressure tolerates for relief of chest pain. 2. Aggressive secondary prevention, including high-intensity statin therapy.  Labs/Other Tests and Data Reviewed:    EKG:  No ECG reviewed.  Recent Labs: 04/18/2018: TSH 3.798 04/23/2018: Magnesium 1.9 06/18/2018: ALT 22; B Natriuretic Peptide 110.2 06/21/2018: BUN 45; Creatinine, Ser 11.77; Hemoglobin 10.3; Platelets 290; Potassium 3.5; Sodium 140   Recent Lipid Panel Lab Results  Component Value Date/Time   CHOL 218 (H) 05/19/2018 09:53 AM   TRIG 122 05/19/2018 09:53 AM   HDL 47 05/19/2018 09:53 AM   CHOLHDL 4.6 (H) 05/19/2018 09:53 AM   CHOLHDL 5.9 02/26/2018 05:09 AM   LDLCALC 147 (H) 05/19/2018 09:53 AM   LDLDIRECT 161.6 11/28/2012 10:42 AM    Wt Readings from Last 3 Encounters:  07/11/18 246 lb (111.6 kg)  06/21/18 258 lb 6.1 oz (117.2 kg)  06/09/18 256 lb (116.1 kg)     Objective:    Vital Signs:  BP (!) 134/96   Pulse 82   Ht 5' 5"  (1.651 m)   Wt 246 lb (111.6 kg)   BMI 40.94 kg/m    VITAL SIGNS:  reviewed GEN:  no acute distress EYES:  sclerae anicteric, EOMI - Extraocular Movements Intact RESPIRATORY:  normal respiratory effort, symmetric expansion MUSCULOSKELETAL:  no obvious deformities. NEURO:  alert and oriented x 3, no obvious focal deficit PSYCH:  normal affect  ASSESSMENT & PLAN:    1.Chronic Angina: She is not a candidate for Ranexa as she is a dialysis patient and this medication can cause prolonged QT interval. She will continue NTG sublingual as she cannot tolerate the isosorbide without having more chest pain.  May consider changing to verapamil on next visit is she has more frequent episodes   2. Hypertension: She is well controlled currently. No changes to he regimen.   3. Non-obstructive CAD: No hemodynamically significant lesions noted on cardiac cath. Continue medical management  4. ESRD; Continue dialysis as planned.    COVID-19 Education: The signs and symptoms of COVID-19 were discussed with the patient and how to seek care for testing (follow up with PCP or arrange E-visit).  The importance of social distancing was discussed today.  Time:   Today, I have spent 15 minutes with the patient with telehealth technology discussing the above problems.     Medication Adjustments/Labs and Tests Ordered: Current medicines are reviewed at length with the patient today.  Concerns regarding medicines are outlined above.   Tests Ordered: No orders of the defined types were placed in this encounter.   Medication Changes: None   Disposition:  Follow up 6 months   Signed, Phill Myron. [place] Pugh, ANP, AACC  07/11/2018 12:27 PM    [facility]

## 2018-07-11 ENCOUNTER — Telehealth: Payer: Self-pay

## 2018-07-11 ENCOUNTER — Telehealth (INDEPENDENT_AMBULATORY_CARE_PROVIDER_SITE_OTHER): Payer: 59 | Admitting: Adult Health

## 2018-07-11 ENCOUNTER — Encounter: Payer: Self-pay | Admitting: Adult Health

## 2018-07-11 VITALS — BP 134/96 | HR 82 | Ht 65.0 in | Wt 246.0 lb

## 2018-07-11 DIAGNOSIS — I10 Essential (primary) hypertension: Secondary | ICD-10-CM

## 2018-07-11 DIAGNOSIS — I2583 Coronary atherosclerosis due to lipid rich plaque: Secondary | ICD-10-CM | POA: Diagnosis not present

## 2018-07-11 DIAGNOSIS — I2510 Atherosclerotic heart disease of native coronary artery without angina pectoris: Secondary | ICD-10-CM

## 2018-07-11 DIAGNOSIS — Z992 Dependence on renal dialysis: Secondary | ICD-10-CM

## 2018-07-11 DIAGNOSIS — N186 End stage renal disease: Secondary | ICD-10-CM

## 2018-07-11 DIAGNOSIS — I209 Angina pectoris, unspecified: Secondary | ICD-10-CM

## 2018-07-11 MED ORDER — RANOLAZINE ER 500 MG PO TB12: 500.0000 mg | ORAL_TABLET | Freq: Two times a day (BID) | ORAL | 3 refills | Status: DC

## 2018-07-11 NOTE — Patient Instructions (Signed)
Medication Instructions:  START ranolazine (RANEXA) 500 MG TWICE DAILY If you need a refill on your cardiac medications before your next appointment, please call your pharmacy.  Follow-Up: You will need a follow up appointment in 2 weeks WITH Jory Sims, [place], [place] .  You may see Skeet Latch, MD , Jory Sims, DNP, AACC  or one of the following Advanced Practice Providers on your designated Care Team:  Kerin Ransom, PA-C  Roby Lofts, PA-C  Sande Rives, [place]      At [facility], you and your health needs are our priority.  As part of our continuing mission to provide you with exceptional heart care, we have created designated Provider Care Teams.  These Care Teams include your primary Cardiologist (physician) and Advanced Practice Providers (APPs -  Physician Assistants and Nurse Practitioners) who all work together to provide you with the care you need, when you need it.  Thank you for choosing [facility] at [facility]!!

## 2018-07-11 NOTE — Telephone Encounter (Signed)
Lendon Colonel, NP  Waylan Rocher, LPN        She is not to take the Ranexa as this is not tolerated by dialysis patients, and can cause prolonged QT interval. Please call pharmacy and the patient and let them know not to fill/take it She is to continue NTG prn.    PHARMACIST NOTIFIED, WILL INACTIVATE AS TO NOT BE REFILLED PT NOTIFIED-SHE WILL NOT TAKE SHE WILL DISCARD AND NOT TAKE. SHE HAS NOT STARTED YET

## 2018-07-21 ENCOUNTER — Telehealth: Payer: Self-pay | Admitting: Adult Health

## 2018-07-21 NOTE — Telephone Encounter (Signed)
I called pt to confirm appt with Jory Sims on 07-25-18.

## 2018-07-23 NOTE — Progress Notes (Signed)
Virtual Visit via Video Note   This visit type was conducted due to national recommendations for restrictions regarding the COVID-19 Pandemic (e.g. social distancing) in an effort to limit this patient's exposure and mitigate transmission in our community.  Due to her co-morbid illnesses, this patient is at least at moderate risk for complications without adequate follow up.  This format is felt to be most appropriate for this patient at this time.  All issues noted in this document were discussed and addressed.  A limited physical exam was performed with this format.  Please refer to the patient's chart for her consent to telehealth for [facility].   Date:  07/25/2018   ID:  Brenda Davis, DOB [birth year], MRN [id number]  Patient Location: Home Provider Location: Home  PCP:  [name], MD  Cardiologist:  Skeet Latch, MD  Electrophysiologist:  None   Evaluation Performed:  Follow-Up Visit  Chief Complaint:    History of Present Illness:    Brenda Davis is a 53 y.o. female we are seeing for ongoing assessment of of coronary artery disease, multivessel.  The patient had cardiac catheterization on 06/20/2018 which revealed moderately severe multivessel CAD not significantly changed since prior cath in 2020.  DFR of the most severe lesion in the RCA at the time was nonhemodynamically significant.  She was recommended for medical therapy as it was no clear culprit for PCI.  The patient was started on isosorbide mononitrate 30 mg daily and aggressive secondary prevention with high intensity statin therapy with atorvastatin 80 mg. She was intolerant of isosorbide. She is not a candidate for Ranexa due to renal disease. Consider changing to verapamil if symptoms were persistent.   Other history includes hepatic cirrhosis, diabetes type 2 with renal complications, ESRD on peritoneal dialysis, hypocalcemia and hypokalemia.  She is still having chest pain, and RSL  and leg pain as well. She has peritoneal dialysis with diabetes, and I suspect that she is having neurologic pain as well. NTG sublingual helps with chest discomfort.   The patient does not have symptoms concerning for COVID-19 infection (fever, chills, cough, or new shortness of breath).    Past Medical History:  Diagnosis Date  . Anemia   . CAD (coronary artery disease)    a. cath in 02/2018 showing moderate 3-vessel CAD with 45% mid-LADm 55% LCx and 65% RCA stenosis which was not significant by FFR  . ESRD on dialysis ([place])   . Gout   . HCAP (healthcare-associated pneumonia) 05/06/2016  . History of blood transfusion    "related to surgery"  . Hyperlipidemia   . Hypertension   . Morbid obesity ([place])   . Pain    LEFT SHOULDER PAIN - WAS SEEN AT AN URGENT CARE - GIVEN SLING FOR COMFORT AND TOLD ROM AS TOLERATED.  Marland Kitchen Palpitations 09/24/2016  . Type II diabetes mellitus ([place])    "gastric sleeve OR corrected this" (05/06/2016)   Past Surgical History:  Procedure Laterality Date  . AV FISTULA PLACEMENT Left 01/03/2015   Procedure: BRACHIAL CEPHALIC ARTERIOVENOUS  FISTULA CREATION LEFT ARM;  Surgeon: Angelia Mould, MD;  Location: [place];  Service: Vascular;  Laterality: Left;  . CARPAL TUNNEL RELEASE Right   . [place]  . CHOLECYSTECTOMY  09/14/2017  . ESOPHAGOGASTRODUODENOSCOPY N/A 09/04/2012   Procedure: ESOPHAGOGASTRODUODENOSCOPY (EGD);  Surgeon: Shann Medal, MD;  [facility];  Service: General;  Laterality: N/A;  PF  . ESOPHAGOGASTRODUODENOSCOPY (EGD) WITH ESOPHAGEAL  DILATION N/A 09/29/2012   Procedure: ESOPHAGOGASTRODUODENOSCOPY (EGD) WITH ESOPHAGEAL DILATION;  Surgeon: Milus Banister, MD;  [facility];  Service: Endoscopy;  Laterality: N/A;  . INSERTION OF DIALYSIS CATHETER N/A 01/03/2015   Procedure: INSERTION OF DIALYSIS CATHETER RIGHT INTERNAL JUGULAR;  Surgeon: Angelia Mould, MD;  Location: [place];  Service: Vascular;   Laterality: N/A;  . LAPAROSCOPIC GASTRIC SLEEVE RESECTION N/A 07/19/2012   Procedure: LAPAROSCOPIC SLEEVE GASTRECTOMY with EGD;  Surgeon: Madilyn Hook, DO;  [facility];  Service: General;  Laterality: N/A;  laparoscopic sleeve gastrectomy with EGD  . LEFT HEART CATH AND CORONARY ANGIOGRAPHY N/A 02/25/2018   Procedure: LEFT HEART CATH AND CORONARY ANGIOGRAPHY;  Surgeon: Leonie Man, MD;  Location: [place] [facility];  Service: Cardiovascular;  Laterality: N/A;  . LEFT HEART CATH AND CORONARY ANGIOGRAPHY N/A 06/20/2018   Procedure: LEFT HEART CATH AND CORONARY ANGIOGRAPHY;  Surgeon: Nelva Bush, MD;  Location: [place] [facility];  Service: Cardiovascular;  Laterality: N/A;  . PERIPHERAL VASCULAR CATHETERIZATION Left 05/23/2015   Procedure: Nolon Stalls;  Surgeon: Conrad Salem, MD;  Location: [place] [facility];  Service: Cardiovascular;  Laterality: Left;  upper aRM  . PERIPHERAL VASCULAR CATHETERIZATION Left 05/23/2015   Procedure: Peripheral Vascular Balloon Angioplasty;  Surgeon: Conrad Hester, MD;  Location: [place] [facility];  Service: Cardiovascular;  Laterality: Left;  av fistula  . TUBAL LIGATION  1993  . UPPER GI ENDOSCOPY N/A 07/19/2012   Procedure: UPPER GI ENDOSCOPY;  Surgeon: Madilyn Hook, DO;  [facility];  Service: General;  Laterality: N/A;     Current Meds  Medication Sig  . acetaminophen (TYLENOL) 325 MG tablet Take 650 mg by mouth every 6 (six) hours as needed for mild pain.  Marland Kitchen aspirin EC 81 MG EC tablet Take 1 tablet (81 mg total) by mouth daily.  Marland Kitchen atorvastatin (LIPITOR) 80 MG tablet Take 1 tablet (80 mg total) by mouth daily at 6 PM.  . AURYXIA 1 GM 210 MG(Fe) tablet Take 210 mg by mouth 3 (three) times daily with meals.   . calcium acetate (PHOSLO) 667 MG capsule Take 667 mg by mouth 3 (three) times daily with meals.  . cinacalcet (SENSIPAR) 30 MG tablet Take 1 tablet (30 mg total) by mouth daily with supper.  . Cyanocobalamin (B-12 PO) Take 1 tablet by mouth  daily.  . folic acid (FOLVITE) 1 MG tablet Take 1 tablet (1 mg total) by mouth daily.  Marland Kitchen gentamicin cream (GARAMYCIN) 0.1 % Apply 1 application topically See admin instructions. Apply to exit site daily as directed.  . hydrOXYzine (ATARAX/VISTARIL) 10 MG tablet Take 10 mg by mouth 2 (two) times daily as needed for itching.  . nitroGLYCERIN (NITROSTAT) 0.4 MG SL tablet Place 1 tablet (0.4 mg total) under the tongue every 5 (five) minutes as needed for chest pain.  Marland Kitchen ondansetron (ZOFRAN ODT) 8 MG disintegrating tablet Take 1 tablet (8 mg total) by mouth every 8 (eight) hours as needed for nausea or vomiting.  . ranolazine (RANEXA) 500 MG 12 hr tablet Take 1 tablet (500 mg total) by mouth 2 (two) times daily.     Allergies:   Amlodipine, Clonidine derivatives, Doxycycline, and Welchol [colesevelam hcl]   Social History   Tobacco Use  . Smoking status: Former Smoker    Packs/day: 1.00    Years: 16.00    Pack years: 16.00    Types: Cigarettes    Quit date: 2009    Years  since quitting: 11.5  . Smokeless tobacco: Never Used  Substance Use Topics  . Alcohol use: Never    Frequency: Never  . Drug use: No     Family Hx: The patient's family history includes Arthritis in an other family member; Diabetes in her father; Hyperlipidemia in an other family member; Hypertension in her mother; Mitral valve prolapse in her mother. There is no history of Cancer, Heart disease, Kidney disease, or Stroke.  ROS:   Please see the history of present illness.    All other systems reviewed and are negative.   Prior CV studies:   The following studies were reviewed today:  Cath 06/20/2018  1. Moderately severe multivessel coronary artery disease not significantly changed since prior catheterization in 02/2018. DFR of the most severe lesion in the RCA at that time was not hemodynamically significant. 2. Normal left ventricular filling pressure. 3. Hyperdynamic left ventricular contraction.   Recommendations: 1. Medical therapy for moderate diffuse coronary artery disease. There is no clear culprit for PCI. I suspect troponin elevation reflects supply-demand mismatch. I will start isosorbide mononitrate 30 mg daily, which can be escalated as blood pressure tolerates for relief of chest pain. 2. Aggressive secondary prevention, including high-intensity statin therapy.  Labs/Other Tests and Data Reviewed:    EKG:  No ECG reviewed.  Recent Labs: 04/18/2018: TSH 3.798 04/23/2018: Magnesium 1.9 06/18/2018: ALT 22; B Natriuretic Peptide 110.2 06/21/2018: BUN 45; Creatinine, Ser 11.77; Hemoglobin 10.3; Platelets 290; Potassium 3.5; Sodium 140   Recent Lipid Panel Lab Results  Component Value Date/Time   CHOL 218 (H) 05/19/2018 09:53 AM   TRIG 122 05/19/2018 09:53 AM   HDL 47 05/19/2018 09:53 AM   CHOLHDL 4.6 (H) 05/19/2018 09:53 AM   CHOLHDL 5.9 02/26/2018 05:09 AM   LDLCALC 147 (H) 05/19/2018 09:53 AM   LDLDIRECT 161.6 11/28/2012 10:42 AM    Wt Readings from Last 3 Encounters:  07/25/18 247 lb (112 kg)  07/11/18 246 lb (111.6 kg)  06/21/18 258 lb 6.1 oz (117.2 kg)     Objective:    Vital Signs:  BP 117/80   Pulse 78   Ht 5\' 5"  (1.651 m)   Wt 247 lb (112 kg)   BMI 41.10 kg/m    VITAL SIGNS:  reviewed GEN:  no acute distress EYES:  sclerae anicteric, EOMI - Extraocular Movements Intact RESPIRATORY:  normal respiratory effort, symmetric expansion NEURO:  alert and oriented x 3, no obvious focal deficit PSYCH:  normal affect  ASSESSMENT & PLAN:    1. CAD: Moderately severe multivessel disease, per cardiac cath 2020 DFR of the most severe lesion in the RCA at the time was nonhemodynamically significant. She is intolerant of isosorbide, but is able to take NTG SL. She may be having neurologic pain as well, related to diabetes. I have recommended that she speak with her nephrologist about restarting gabapentin. She states that she will call them today as she has follow  up on July 9,2020. She is to call us if chest pain becomes more intense or debilitating.   2. Hypercholesterolemia: She continues on atorvastatin 80 mg daily. She will need follow up labs on next appointment if not completed by PCP. Goal of LDL < 70. LDL 05/19/2018 was 147.   3.ESRD: Now on peritoneal dialysis: To see nephrologist July 9,2020.   4. Hypertension: Currently  well controlled  COVID-19 Education: The signs and symptoms of COVID-19 were discussed with the patient and how to seek care for testing (follow  up with PCP or arrange E-visit).  The importance of social distancing was discussed today.  Time:   Today, I have spent 10 minutes with the patient with telehealth technology discussing the above problems.     Medication Adjustments/Labs and Tests Ordered: Current medicines are reviewed at length with the patient today.  Concerns regarding medicines are outlined above.   Tests Ordered: No orders of the defined types were placed in this encounter.   Medication Changes: No orders of the defined types were placed in this encounter.   Disposition:  Follow up 3 - 4 months   Signed, Phill Myron. West Pugh, ANP, AACC  07/25/2018 9:24 AM     [facility]

## 2018-07-25 ENCOUNTER — Other Ambulatory Visit: Payer: Self-pay

## 2018-07-25 ENCOUNTER — Telehealth (INDEPENDENT_AMBULATORY_CARE_PROVIDER_SITE_OTHER): Payer: 59 | Admitting: Adult Health

## 2018-07-25 ENCOUNTER — Encounter: Payer: Self-pay | Admitting: Adult Health

## 2018-07-25 VITALS — BP 117/80 | HR 78 | Ht 65.0 in | Wt 247.0 lb

## 2018-07-25 DIAGNOSIS — N186 End stage renal disease: Secondary | ICD-10-CM

## 2018-07-25 DIAGNOSIS — I2510 Atherosclerotic heart disease of native coronary artery without angina pectoris: Secondary | ICD-10-CM | POA: Diagnosis not present

## 2018-07-25 DIAGNOSIS — Z992 Dependence on renal dialysis: Secondary | ICD-10-CM

## 2018-07-25 DIAGNOSIS — I10 Essential (primary) hypertension: Secondary | ICD-10-CM | POA: Diagnosis not present

## 2018-07-25 DIAGNOSIS — E785 Hyperlipidemia, unspecified: Secondary | ICD-10-CM

## 2018-07-25 NOTE — Patient Instructions (Signed)
Medication Instructions:  Continue current medications  If you need a refill on your cardiac medications before your next appointment, please call your pharmacy.  Labwork: None Ordered   Testing/Procedures: None Ordered  Follow-Up: You will need a follow up appointment in 3 months.  Please call our office 2 months in advance to schedule this appointment.  You may see Skeet Latch, MD or one of the following Advanced Practice Providers on your designated Care Team:   Kerin Ransom, PA-C Roby Lofts, [place] . Sande Rives, PA-C     At [facility], you and your health needs are our priority.  As part of our continuing mission to provide you with exceptional heart care, we have created designated Provider Care Teams.  These Care Teams include your primary Cardiologist (physician) and Advanced Practice Providers (APPs -  Physician Assistants and Nurse Practitioners) who all work together to provide you with the care you need, when you need it.  Thank you for choosing [facility]!!

## 2018-08-01 ENCOUNTER — Other Ambulatory Visit: Payer: Self-pay | Admitting: Cardiovascular Disease

## 2018-08-11 LAB — HM DIABETES EYE EXAM

## 2018-08-23 ENCOUNTER — Telehealth: Payer: Self-pay

## 2018-08-23 NOTE — Telephone Encounter (Signed)
Left message for patient to call back to schedule with Dr. Tamala Julian.

## 2018-09-11 NOTE — Progress Notes (Signed)
[facility] [place], [place] [address] Phone: [phone] Subjective:   [name] No, am serving as a scribe for Dr. Hulan Saas.  I'm seeing this patient by the request  of:  [name], MD   CC: finger pain   [id number]   06/09/2018 Improved at this time.  No significant change in management.  Patient can follow-up as needed.  May need allopurinol in the long run.  Update 09/12/2018 [name] is a 53 y.o. female coming in with complaint of left hand middle finger. Patient states that in the morning the finger is locked and she has to physically extend the finger. Does note that during the day her finger gets tight but      Past Medical History:  Diagnosis Date  . Anemia   . CAD (coronary artery disease)    a. cath in 02/2018 showing moderate 3-vessel CAD with 45% mid-LADm 55% LCx and 65% RCA stenosis which was not significant by FFR  . ESRD on dialysis ([place])   . Gout   . HCAP (healthcare-associated pneumonia) 05/06/2016  . History of blood transfusion    "related to surgery"  . Hyperlipidemia   . Hypertension   . Morbid obesity ([place])   . Pain    LEFT SHOULDER PAIN - WAS SEEN AT AN URGENT CARE - GIVEN SLING FOR COMFORT AND TOLD ROM AS TOLERATED.  Marland Kitchen Palpitations 09/24/2016  . Type II diabetes mellitus ([place])    "gastric sleeve OR corrected this" (05/06/2016)   Past Surgical History:  Procedure Laterality Date  . AV FISTULA PLACEMENT Left 01/03/2015   Procedure: BRACHIAL CEPHALIC ARTERIOVENOUS  FISTULA CREATION LEFT ARM;  Surgeon: Angelia Mould, MD;  Location: [facility];  Service: Vascular;  Laterality: Left;  . CARPAL TUNNEL RELEASE Right   . [place]  . CHOLECYSTECTOMY  09/14/2017  . ESOPHAGOGASTRODUODENOSCOPY N/A 09/04/2012   Procedure: ESOPHAGOGASTRODUODENOSCOPY (EGD);  Surgeon: Shann Medal, MD;  [facility];  Service: General;  Laterality: N/A;  PF  .  ESOPHAGOGASTRODUODENOSCOPY (EGD) WITH ESOPHAGEAL DILATION N/A 09/29/2012   Procedure: ESOPHAGOGASTRODUODENOSCOPY (EGD) WITH ESOPHAGEAL DILATION;  Surgeon: Milus Banister, MD;  [facility];  Service: Endoscopy;  Laterality: N/A;  . INSERTION OF DIALYSIS CATHETER N/A 01/03/2015   Procedure: INSERTION OF DIALYSIS CATHETER RIGHT INTERNAL JUGULAR;  Surgeon: Angelia Mould, MD;  Location: [place];  Service: Vascular;  Laterality: N/A;  . LAPAROSCOPIC GASTRIC SLEEVE RESECTION N/A 07/19/2012   Procedure: LAPAROSCOPIC SLEEVE GASTRECTOMY with EGD;  Surgeon: Madilyn Hook, DO;  [facility];  Service: General;  Laterality: N/A;  laparoscopic sleeve gastrectomy with EGD  . LEFT HEART CATH AND CORONARY ANGIOGRAPHY N/A 02/25/2018   Procedure: LEFT HEART CATH AND CORONARY ANGIOGRAPHY;  Surgeon: Leonie Man, MD;  Location: [place] [facility];  Service: Cardiovascular;  Laterality: N/A;  . LEFT HEART CATH AND CORONARY ANGIOGRAPHY N/A 06/20/2018   Procedure: LEFT HEART CATH AND CORONARY ANGIOGRAPHY;  Surgeon: Nelva Bush, MD;  Location: [place] [facility];  Service: Cardiovascular;  Laterality: N/A;  . PERIPHERAL VASCULAR CATHETERIZATION Left 05/23/2015   Procedure: Nolon Stalls;  Surgeon: Conrad Martinsburg, MD;  Location: [place] [facility];  Service: Cardiovascular;  Laterality: Left;  upper aRM  . PERIPHERAL VASCULAR CATHETERIZATION Left 05/23/2015   Procedure: Peripheral Vascular Balloon Angioplasty;  Surgeon: Conrad Wheaton, MD;  Location: [place] [facility];  Service: Cardiovascular;  Laterality: Left;  av  fistula  . TUBAL LIGATION  1993  . UPPER GI ENDOSCOPY N/A 07/19/2012   Procedure: UPPER GI ENDOSCOPY;  Surgeon: Madilyn Hook, DO;  [facility];  Service: General;  Laterality: N/A;   Social History   Socioeconomic History  . Marital status: Married    Spouse name: Not on file  . Number of children: 3  . Years of education: Not on file  . Highest education level: Not on file   Occupational History  . Occupation: Research scientist (physical sciences): [employer]  Social Needs  . Financial resource strain: Not on file  . Food insecurity    Worry: Not on file    Inability: Not on file  . Transportation needs    Medical: Not on file    Non-medical: Not on file  Tobacco Use  . Smoking status: Former Smoker    Packs/day: 1.00    Years: 16.00    Pack years: 16.00    Types: Cigarettes    Quit date: 2009    Years since quitting: 11.6  . Smokeless tobacco: Never Used  Substance and Sexual Activity  . Alcohol use: Never    Frequency: Never  . Drug use: No  . Sexual activity: Yes    Birth control/protection: Post-menopausal  Lifestyle  . Physical activity    Days per week: Not on file    Minutes per session: Not on file  . Stress: Not on file  Relationships  . Social Herbalist on phone: Not on file    Gets together: Not on file    Attends religious service: Not on file    Active member of club or organization: Not on file    Attends meetings of clubs or organizations: Not on file    Relationship status: Not on file  Other Topics Concern  . Not on file  Social History Narrative  . Not on file   Allergies  Allergen Reactions  . Amlodipine Swelling  . Clonidine Derivatives Swelling    Limbs swell  . Doxycycline Nausea And Vomiting    "I threw up for 3 hours"  . Welchol [Colesevelam Hcl] Nausea Only   Family History  Problem Relation Age of Onset  . Hypertension Mother   . Mitral valve prolapse Mother   . Diabetes Father   . Arthritis Other   . Hyperlipidemia Other   . Cancer Neg Hx   . Heart disease Neg Hx   . Kidney disease Neg Hx   . Stroke Neg Hx     Current Outpatient Medications (Endocrine & Metabolic):  .  cinacalcet (SENSIPAR) 30 MG tablet, Take 1 tablet (30 mg total) by mouth daily with supper.  Current Outpatient Medications (Cardiovascular):  .  atorvastatin (LIPITOR) 80 MG tablet, Take 1 tablet (80 mg total) by mouth daily at  6 PM. .  nitroGLYCERIN (NITROSTAT) 0.4 MG SL tablet, PLACE 1 TABLET (0.4 MG TOTAL) UNDER THE TONGUE EVERY 5 (FIVE) MINUTES AS NEEDED FOR CHEST PAIN.   Current Outpatient Medications (Analgesics):  .  acetaminophen (TYLENOL) 325 MG tablet, Take 650 mg by mouth every 6 (six) hours as needed for mild pain. Marland Kitchen  aspirin EC 81 MG EC tablet, Take 1 tablet (81 mg total) by mouth daily.  Current Outpatient Medications (Hematological):  Marland Kitchen  Cyanocobalamin (B-12 PO), Take 1 tablet by mouth daily. .  folic acid (FOLVITE) 1 MG tablet, Take 1 tablet (1 mg total) by mouth daily.  Current Outpatient Medications (Other):  .  AURYXIA 1 GM 210 MG(Fe) tablet, Take 210 mg by mouth 3 (three) times daily with meals.  .  calcium acetate (PHOSLO) 667 MG capsule, Take 667 mg by mouth 3 (three) times daily with meals. Marland Kitchen  gentamicin cream (GARAMYCIN) 0.1 %, Apply 1 application topically See admin instructions. Apply to exit site daily as directed. .  hydrOXYzine (ATARAX/VISTARIL) 10 MG tablet, Take 10 mg by mouth 2 (two) times daily as needed for itching. .  ondansetron (ZOFRAN ODT) 8 MG disintegrating tablet, Take 1 tablet (8 mg total) by mouth every 8 (eight) hours as needed for nausea or vomiting.    Past medical history, social, surgical and family history all reviewed in electronic medical record.  No pertanent information unless stated regarding to the chief complaint.   Review of Systems:  No headache, visual changes, nausea, vomiting, diarrhea, constipation, dizziness, abdominal pain, skin rash, fevers, chills, night sweats, weight loss, swollen lymph nodes, body aches, joint swelling, chest pain, shortness of breath, mood changes.  Positive muscle aches  Objective  Blood pressure 124/86, pulse 77, height 5' 5"  (1.651 m).    General: No apparent distress alert and oriented x3 mood and affect normal, dressed appropriately.  HEENT: Pupils equal, extraocular movements intact  Respiratory: Patient's speak in  full sentences and does not appear short of breath  Cardiovascular: 1 lower extremity edema, non tender, no erythema  Skin: Warm dry intact with no signs of infection or rash on extremities or on axial skeleton.  Abdomen: Soft nontender  Neuro: Cranial nerves II through XII are intact, neurovascularly intact in all extremities with 2+ DTRs and 2+ pulses.  Lymph: No lymphadenopathy of posterior or anterior cervical chain or axillae bilaterally.  Gait antalgic Left hand exam shows the patient does have triggering with a tender nodule at the A2 left middle finger.  Tender to palpation.  Triggering noted as well.  Neurovascular intact distally  Procedure: Real-time Ultrasound Guided Injection of left flexor tendon sheath of the middle finger Device: GE Logiq Q7 Ultrasound guided injection is preferred based studies that show increased duration, increased effect, greater accuracy, decreased procedural pain, increased response rate, and decreased cost with ultrasound guided versus blind injection.  Verbal informed consent obtained.  Time-out conducted.  Noted no overlying erythema, induration, or other signs of local infection.  Skin prepped in a sterile fashion.  Local anesthesia: Topical Ethyl chloride.  With sterile technique and under real time ultrasound guidance: With a 25-gauge half inch needle injected with 0.5 cc of 0.5% Marcaine and 0.5 cc of Kenalog 40 mg/mL Completed without difficulty  Pain immediately resolved suggesting accurate placement of the medication.  Advised to call if fevers/chills, erythema, induration, drainage, or persistent bleeding.  Images permanently stored and available for review in the ultrasound unit.  Impression: Technically successful ultrasound guided injection.    Impression and Recommendations:     This case required medical decision making of moderate complexity. The above documentation has been reviewed and is accurate and complete Lyndal Pulley, DO        Note: This dictation was prepared with Dragon dictation along with smaller phrase technology. Any transcriptional errors that result from this process are unintentional.

## 2018-09-12 ENCOUNTER — Other Ambulatory Visit: Payer: Self-pay

## 2018-09-12 ENCOUNTER — Encounter: Payer: Self-pay | Admitting: Family Medicine

## 2018-09-12 ENCOUNTER — Ambulatory Visit: Payer: Self-pay

## 2018-09-12 ENCOUNTER — Ambulatory Visit (INDEPENDENT_AMBULATORY_CARE_PROVIDER_SITE_OTHER): Payer: 59 | Admitting: Family Medicine

## 2018-09-12 VITALS — BP 124/86 | HR 77 | Ht 65.0 in

## 2018-09-12 DIAGNOSIS — M79642 Pain in left hand: Secondary | ICD-10-CM | POA: Diagnosis not present

## 2018-09-12 DIAGNOSIS — M65332 Trigger finger, left middle finger: Secondary | ICD-10-CM | POA: Diagnosis not present

## 2018-09-12 NOTE — Assessment & Plan Note (Signed)
Patient given injection and tolerated the procedure well.  Discussed icing regimen and home exercise, discussed which activities of doing which wants to avoid, discussed bracing at night, follow-up again 12 weeks

## 2018-09-12 NOTE — Patient Instructions (Signed)
Injected finger today See me again in 10-12 weeks

## 2018-09-14 ENCOUNTER — Telehealth: Payer: Self-pay | Admitting: Cardiovascular Disease

## 2018-09-14 NOTE — Telephone Encounter (Signed)
  Patient would like to know what she was diagnosed with at her last visit

## 2018-09-14 NOTE — Telephone Encounter (Signed)
Left message to call back  

## 2018-09-14 NOTE — Telephone Encounter (Signed)
Pt calling to see what are Dx were listed as from last OV. Informed pt per chart review it show CAD, HTN, and hypercholesterolemia. Pt voiced understanding.

## 2018-10-04 ENCOUNTER — Encounter: Payer: Self-pay | Admitting: Internal Medicine

## 2018-10-11 ENCOUNTER — Ambulatory Visit: Payer: 59 | Admitting: Internal Medicine

## 2018-10-11 DIAGNOSIS — Z0289 Encounter for other administrative examinations: Secondary | ICD-10-CM

## 2018-11-23 ENCOUNTER — Other Ambulatory Visit: Payer: 59

## 2018-11-23 ENCOUNTER — Encounter: Payer: Self-pay | Admitting: Internal Medicine

## 2018-11-23 ENCOUNTER — Other Ambulatory Visit: Payer: Self-pay

## 2018-11-23 ENCOUNTER — Ambulatory Visit (INDEPENDENT_AMBULATORY_CARE_PROVIDER_SITE_OTHER): Payer: 59 | Admitting: Internal Medicine

## 2018-11-23 VITALS — BP 140/100 | HR 84 | Temp 97.9°F | Ht 65.0 in | Wt 282.0 lb

## 2018-11-23 DIAGNOSIS — N186 End stage renal disease: Secondary | ICD-10-CM

## 2018-11-23 DIAGNOSIS — A0472 Enterocolitis due to Clostridium difficile, not specified as recurrent: Secondary | ICD-10-CM | POA: Diagnosis not present

## 2018-11-23 DIAGNOSIS — I5032 Chronic diastolic (congestive) heart failure: Secondary | ICD-10-CM | POA: Diagnosis not present

## 2018-11-23 DIAGNOSIS — R300 Dysuria: Secondary | ICD-10-CM | POA: Diagnosis not present

## 2018-11-23 DIAGNOSIS — Z992 Dependence on renal dialysis: Secondary | ICD-10-CM

## 2018-11-23 LAB — POCT URINALYSIS DIPSTICK
Bilirubin, UA: NEGATIVE
Glucose, UA: NEGATIVE
Ketones, UA: NEGATIVE
Nitrite, UA: NEGATIVE
Protein, UA: POSITIVE — AB
Spec Grav, UA: 1.025 (ref 1.010–1.025)
Urobilinogen, UA: 0.2 E.U./dL
pH, UA: 6 (ref 5.0–8.0)

## 2018-11-23 MED ORDER — FOSFOMYCIN TROMETHAMINE 3 G PO PACK: 3.0000 g | PACK | Freq: Once | ORAL | 0 refills | Status: AC

## 2018-11-23 NOTE — Patient Instructions (Addendum)
We will send in fosfomycin that is a drink to do once to clear the infection.   We will do a culture to make sure the antibiotic will work.

## 2018-11-23 NOTE — Progress Notes (Signed)
   Subjective:   Patient ID: Brenda Davis, female    DOB: January 01, 1966, 53 y.o.   MRN: [id number]  HPI The patient is a 53 YO female with complicated medical history including ESRD on peritoneal dialysis, cirrhosis, CHF who is coming in for concerns about kidney infection. Started having burning in the low abdomen and low back about 1 week days ago. Denies fevers or chills. She does the peritoneal dialysis daily and this does not have any signs of infection or pain. She has been seen at dialysis most recently today and they did not find any problems. Having urine production in the morning and once more throughout the day.   Review of Systems  Constitutional: Negative.   HENT: Negative.   Eyes: Negative.   Respiratory: Negative.   Cardiovascular: Negative.   Gastrointestinal: Positive for abdominal pain. Negative for abdominal distention, constipation, diarrhea, nausea and vomiting.  Genitourinary: Positive for dysuria. Negative for difficulty urinating, dyspareunia, frequency, genital sores and urgency.  Musculoskeletal: Negative.   Skin: Negative.   Psychiatric/Behavioral: Negative.     Objective:  Physical Exam Constitutional:      Appearance: She is well-developed.  HENT:     Head: Normocephalic and atraumatic.  Neck:     Musculoskeletal: Normal range of motion.  Cardiovascular:     Rate and Rhythm: Normal rate and regular rhythm.  Pulmonary:     Effort: Pulmonary effort is normal. No respiratory distress.     Breath sounds: Normal breath sounds. No wheezing or rales.  Abdominal:     General: Bowel sounds are normal. There is no distension.     Palpations: Abdomen is soft.     Tenderness: There is no abdominal tenderness. There is no rebound.     Comments: Minimal tenderness suprapubic, no peritoneal tenderness or diffuse abdominal pain or rebound.   Musculoskeletal:        General: Tenderness present.     Comments: Minimal tenderness lumbar paraspinally  Skin:     General: Skin is warm and dry.  Neurological:     Mental Status: She is alert and oriented to person, place, and time.     Coordination: Coordination normal.     Vitals:   11/23/18 1108  BP: (!) 140/100  Pulse: 84  Temp: 97.9 F (36.6 C)  TempSrc: Oral  SpO2: 98%  Weight: 282 lb (127.9 kg)  Height: 5' 5"  (1.651 m)    Assessment & Plan:  Visit time 25 minutes: greater than 50% of that time was spent in face to face counseling and coordination of care with the patient: counseled about potential etiology and urine evaluation and treatment options

## 2018-11-25 DIAGNOSIS — R300 Dysuria: Secondary | ICD-10-CM | POA: Insufficient documentation

## 2018-11-25 LAB — URINE CULTURE
MICRO NUMBER:: 1063986
Result:: NO GROWTH
SPECIMEN QUALITY:: ADEQUATE

## 2018-11-25 NOTE — Assessment & Plan Note (Signed)
Does not appear to be in flare today.

## 2018-11-25 NOTE — Assessment & Plan Note (Signed)
The U/A done in the office is consistent with infection. Given her recent bouts with sepsis will treat empirically with fosfomycin even given the inconsistency with dialysis patients with urine analysis. She is advised if not resolving she needs to return and not wait.

## 2018-11-25 NOTE — Assessment & Plan Note (Signed)
We need to be very cautious with history of c dif and will pick fosfomycin as it is low risk for c dif. Asked to continue probiotic while on this.

## 2018-11-25 NOTE — Assessment & Plan Note (Signed)
No infection on exam today.

## 2018-11-28 ENCOUNTER — Telehealth: Payer: Self-pay | Admitting: Internal Medicine

## 2018-11-28 NOTE — Telephone Encounter (Signed)
It is not release to mychart and there is no result to the UA or Culture for me to give to patient

## 2018-11-28 NOTE — Telephone Encounter (Signed)
Copied from [place] [id number]. Topic: General - Other >> Nov 28, 2018  8:14 AM Keene Breath wrote: Reason for CRM: Patient is calling to get the results of her tests.  It is not showing on her My Chart.  CB# [phone]

## 2018-11-28 NOTE — Telephone Encounter (Signed)
Released results through [employer]

## 2018-11-28 NOTE — Progress Notes (Deleted)
[name] [place] [place], [place] [address] Phone: [phone] Subjective:    I'm seeing this patient by the request  of:    CC:   [id number]   09/12/2018 Patient given injection and tolerated the procedure well.  Discussed icing regimen and home exercise, discussed which activities of doing which wants to avoid, discussed bracing at night, follow-up again 12 weeks  Update 11/29/2018 Brenda Davis is a 53 y.o. female coming in with complaint of left hand pain. Patient states      Past Medical History:  Diagnosis Date  . Anemia   . CAD (coronary artery disease)    a. cath in 02/2018 showing moderate 3-vessel CAD with 45% mid-LADm 55% LCx and 65% RCA stenosis which was not significant by FFR  . ESRD on dialysis ([place])   . Gout   . HCAP (healthcare-associated pneumonia) 05/06/2016  . History of blood transfusion    "related to surgery"  . Hyperlipidemia   . Hypertension   . Morbid obesity ([place])   . Pain    LEFT SHOULDER PAIN - WAS SEEN AT AN URGENT CARE - GIVEN SLING FOR COMFORT AND TOLD ROM AS TOLERATED.  Marland Kitchen Palpitations 09/24/2016  . Type II diabetes mellitus ([place])    "gastric sleeve OR corrected this" (05/06/2016)   Past Surgical History:  Procedure Laterality Date  . AV FISTULA PLACEMENT Left 01/03/2015   Procedure: BRACHIAL CEPHALIC ARTERIOVENOUS  FISTULA CREATION LEFT ARM;  Surgeon: Angelia Mould, MD;  Location: [facility];  Service: Vascular;  Laterality: Left;  . CARPAL TUNNEL RELEASE Right   . Show Low  . CHOLECYSTECTOMY  09/14/2017  . ESOPHAGOGASTRODUODENOSCOPY N/A 09/04/2012   Procedure: ESOPHAGOGASTRODUODENOSCOPY (EGD);  Surgeon: Shann Medal, MD;  Location: [facility];  Service: General;  Laterality: N/A;  PF  . ESOPHAGOGASTRODUODENOSCOPY (EGD) WITH ESOPHAGEAL DILATION N/A 09/29/2012   Procedure: ESOPHAGOGASTRODUODENOSCOPY (EGD) WITH ESOPHAGEAL DILATION;  Surgeon: Milus Banister, MD;   Location: [facility];  Service: Endoscopy;  Laterality: N/A;  . INSERTION OF DIALYSIS CATHETER N/A 01/03/2015   Procedure: INSERTION OF DIALYSIS CATHETER RIGHT INTERNAL JUGULAR;  Surgeon: Angelia Mould, MD;  Location: [facility];  Service: Vascular;  Laterality: N/A;  . LAPAROSCOPIC GASTRIC SLEEVE RESECTION N/A 07/19/2012   Procedure: LAPAROSCOPIC SLEEVE GASTRECTOMY with EGD;  Surgeon: Madilyn Hook, DO;  Location: [facility];  Service: General;  Laterality: N/A;  laparoscopic sleeve gastrectomy with EGD  . LEFT HEART CATH AND CORONARY ANGIOGRAPHY N/A 02/25/2018   Procedure: LEFT HEART CATH AND CORONARY ANGIOGRAPHY;  Surgeon: Leonie Man, MD;  Location: [facility];  Service: Cardiovascular;  Laterality: N/A;  . LEFT HEART CATH AND CORONARY ANGIOGRAPHY N/A 06/20/2018   Procedure: LEFT HEART CATH AND CORONARY ANGIOGRAPHY;  Surgeon: Nelva Bush, MD;  Location: [facility];  Service: Cardiovascular;  Laterality: N/A;  . PERIPHERAL VASCULAR CATHETERIZATION Left 05/23/2015   Procedure: Nolon Stalls;  Surgeon: Conrad Dadeville, MD;  Location: [facility];  Service: Cardiovascular;  Laterality: Left;  upper aRM  . PERIPHERAL VASCULAR CATHETERIZATION Left 05/23/2015   Procedure: Peripheral Vascular Balloon Angioplasty;  Surgeon: Conrad Perry Hall, MD;  Location: [facility];  Service: Cardiovascular;  Laterality: Left;  av fistula  . TUBAL LIGATION  1993  . UPPER GI ENDOSCOPY N/A 07/19/2012   Procedure: UPPER GI ENDOSCOPY;  Surgeon: Madilyn Hook, DO;  Location: [facility];  Service: General;  Laterality: N/A;   Social  History   Socioeconomic History  . Marital status: Married    Spouse name: Not on file  . Number of children: 3  . Years of education: Not on file  . Highest education level: Not on file  Occupational History  . Occupation: Research scientist (physical sciences): [employer]  Social Needs  . Financial resource strain: Not on file  . Food insecurity    Worry: Not on file     Inability: Not on file  . Transportation needs    Medical: Not on file    Non-medical: Not on file  Tobacco Use  . Smoking status: Former Smoker    Packs/day: 1.00    Years: 16.00    Pack years: 16.00    Types: Cigarettes    Quit date: 2009    Years since quitting: 11.8  . Smokeless tobacco: Never Used  Substance and Sexual Activity  . Alcohol use: Never    Frequency: Never  . Drug use: No  . Sexual activity: Yes    Birth control/protection: Post-menopausal  Lifestyle  . Physical activity    Days per week: Not on file    Minutes per session: Not on file  . Stress: Not on file  Relationships  . Social Herbalist on phone: Not on file    Gets together: Not on file    Attends religious service: Not on file    Active member of club or organization: Not on file    Attends meetings of clubs or organizations: Not on file    Relationship status: Not on file  Other Topics Concern  . Not on file  Social History Narrative  . Not on file   Allergies  Allergen Reactions  . Amlodipine Swelling  . Clonidine Derivatives Swelling    Limbs swell  . Doxycycline Nausea And Vomiting    "I threw up for 3 hours"  . Welchol [Colesevelam Hcl] Nausea Only   Family History  Problem Relation Age of Onset  . Hypertension Mother   . Mitral valve prolapse Mother   . Diabetes Father   . Arthritis Other   . Hyperlipidemia Other   . Cancer Neg Hx   . Heart disease Neg Hx   . Kidney disease Neg Hx   . Stroke Neg Hx     Current Outpatient Medications (Endocrine & Metabolic):  .  cinacalcet (SENSIPAR) 30 MG tablet, Take 1 tablet (30 mg total) by mouth daily with supper.  Current Outpatient Medications (Cardiovascular):  .  atorvastatin (LIPITOR) 80 MG tablet, Take 1 tablet (80 mg total) by mouth daily at 6 PM. .  nitroGLYCERIN (NITROSTAT) 0.4 MG SL tablet, PLACE 1 TABLET (0.4 MG TOTAL) UNDER THE TONGUE EVERY 5 (FIVE) MINUTES AS NEEDED FOR CHEST PAIN.   Current Outpatient  Medications (Analgesics):  .  acetaminophen (TYLENOL) 325 MG tablet, Take 650 mg by mouth every 6 (six) hours as needed for mild pain. Marland Kitchen  aspirin EC 81 MG EC tablet, Take 1 tablet (81 mg total) by mouth daily.  Current Outpatient Medications (Hematological):  Marland Kitchen  Cyanocobalamin (B-12 PO), Take 1 tablet by mouth daily. .  folic acid (FOLVITE) 1 MG tablet, Take 1 tablet (1 mg total) by mouth daily.  Current Outpatient Medications (Other):  Marland Kitchen  AURYXIA 1 GM 210 MG(Fe) tablet, Take 210 mg by mouth 3 (three) times daily with meals.  .  calcium acetate (PHOSLO) 667 MG capsule, Take 667 mg by mouth 3 (three) times daily  with meals. .  gabapentin (NEURONTIN) 100 MG capsule, Take 100 mg by mouth 2 (two) times daily. Marland Kitchen  gentamicin cream (GARAMYCIN) 0.1 %, Apply 1 application topically See admin instructions. Apply to exit site daily as directed. .  hydrOXYzine (ATARAX/VISTARIL) 10 MG tablet, Take 10 mg by mouth 2 (two) times daily as needed for itching. .  ondansetron (ZOFRAN ODT) 8 MG disintegrating tablet, Take 1 tablet (8 mg total) by mouth every 8 (eight) hours as needed for nausea or vomiting.    Past medical history, social, surgical and family history all reviewed in electronic medical record.  No pertanent information unless stated regarding to the chief complaint.   Review of Systems:  No headache, visual changes, nausea, vomiting, diarrhea, constipation, dizziness, abdominal pain, skin rash, fevers, chills, night sweats, weight loss, swollen lymph nodes, body aches, joint swelling, muscle aches, chest pain, shortness of breath, mood changes.   Objective  There were no vitals taken for this visit. Systems examined below as of    General: No apparent distress alert and oriented x3 mood and affect normal, dressed appropriately.  HEENT: Pupils equal, extraocular movements intact  Respiratory: Patient's speak in full sentences and does not appear short of breath  Cardiovascular: No lower  extremity edema, non tender, no erythema  Skin: Warm dry intact with no signs of infection or rash on extremities or on axial skeleton.  Abdomen: Soft nontender  Neuro: Cranial nerves II through XII are intact, neurovascularly intact in all extremities with 2+ DTRs and 2+ pulses.  Lymph: No lymphadenopathy of posterior or anterior cervical chain or axillae bilaterally.  Gait normal with good balance and coordination.  MSK:  Non tender with full range of motion and good stability and symmetric strength and tone of shoulders, elbows, wrist, hip, knee and ankles bilaterally.     Impression and Recommendations:     This case required medical decision making of moderate complexity. The above documentation has been reviewed and is accurate and complete Lyndal Pulley, DO       Note: This dictation was prepared with Dragon dictation along with smaller phrase technology. Any transcriptional errors that result from this process are unintentional.

## 2018-11-28 NOTE — Telephone Encounter (Signed)
U/A discussed at visit, urine culture no growth. You should be able to release them to mychart.

## 2018-11-28 NOTE — Telephone Encounter (Signed)
I am not sure what test? The urine culture has been reviewed and should be available

## 2018-11-29 ENCOUNTER — Ambulatory Visit: Payer: 59 | Admitting: Family Medicine

## 2018-11-30 NOTE — Telephone Encounter (Signed)
Pt states she cannot see anything in mychart. Pt would like to know if she needs to pick up the medication at the pharmacy. Wants to know if she has UTI?    [phone]

## 2018-11-30 NOTE — Telephone Encounter (Signed)
Patient informed of results and do not need to get ABX. Patient stated understanding

## 2018-12-11 ENCOUNTER — Encounter: Payer: Self-pay | Admitting: *Deleted

## 2018-12-12 ENCOUNTER — Encounter: Payer: Self-pay | Admitting: Cardiovascular Disease

## 2018-12-12 ENCOUNTER — Telehealth (INDEPENDENT_AMBULATORY_CARE_PROVIDER_SITE_OTHER): Payer: 59 | Admitting: Cardiovascular Disease

## 2018-12-12 VITALS — BP 128/90 | Ht 65.0 in | Wt 272.0 lb

## 2018-12-12 DIAGNOSIS — Z5181 Encounter for therapeutic drug level monitoring: Secondary | ICD-10-CM | POA: Diagnosis not present

## 2018-12-12 DIAGNOSIS — E785 Hyperlipidemia, unspecified: Secondary | ICD-10-CM | POA: Diagnosis not present

## 2018-12-12 DIAGNOSIS — I2510 Atherosclerotic heart disease of native coronary artery without angina pectoris: Secondary | ICD-10-CM

## 2018-12-12 DIAGNOSIS — I10 Essential (primary) hypertension: Secondary | ICD-10-CM

## 2018-12-12 DIAGNOSIS — K7460 Unspecified cirrhosis of liver: Secondary | ICD-10-CM

## 2018-12-12 MED ORDER — ROSUVASTATIN CALCIUM 10 MG PO TABS: 10.0000 mg | ORAL_TABLET | Freq: Every day | ORAL | 3 refills | Status: DC

## 2018-12-12 NOTE — Patient Instructions (Signed)
Medication Instructions:  START ROSUVASTATIN 10 MG DAILY   *If you need a refill on your cardiac medications before your next appointment, please call your pharmacy*  Lab Work: HFP IN 2 WEEKS, OK TO EAT AND DRINK WHAT YOU   FASTING LP/CMET IN 2 MONTHS, ONLY WATER TO DRINK THAT MORNING  If you have labs (blood work) drawn today and your tests are completely normal, you will receive your results only by: Marland Kitchen MyChart Message (if you have MyChart) OR . A paper copy in the mail If you have any lab test that is abnormal or we need to change your treatment, we will call you to review the results.  Testing/Procedures: NONE  Follow-Up: At [facility], you and your health needs are our priority.  As part of our continuing mission to provide you with exceptional heart care, we have created designated Provider Care Teams.  These Care Teams include your primary Cardiologist (physician) and Advanced Practice Providers (APPs -  Physician Assistants and Nurse Practitioners) who all work together to provide you with the care you need, when you need it.  Your next appointment:   Your physician wants you to follow-up in: [place] will receive a reminder letter in the mail two months in advance. If you don't receive a letter, please call our office to schedule the follow-up appointment.   The format for your next appointment:   In Person  Provider:   You may see Skeet Latch, MD or one of the following Advanced Practice Providers on your designated Care Team:    Kerin Ransom, PA-C  [place], [place]  Coletta Memos, 

## 2018-12-12 NOTE — Progress Notes (Signed)
Virtual Visit via Video Note   This visit type was conducted due to national recommendations for restrictions regarding the COVID-19 Pandemic (e.g. social distancing) in an effort to limit this patient's exposure and mitigate transmission in our community.  Due to her co-morbid illnesses, this patient is at least at moderate risk for complications without adequate follow up.  This format is felt to be most appropriate for this patient at this time.  All issues noted in this document were discussed and addressed.  A limited physical exam was performed with this format.  Please refer to the patient's chart for her consent to tele[facility].   Date:  12/12/2018   ID:  Brenda Davis, DOB 1965/09/26, MRN [id number]  Patient Location: Home Provider Location: Home  PCP:  [name], MD  Cardiologist:  Skeet Latch, MD  Electrophysiologist:  None   Evaluation Performed:  Follow-Up Visit  Chief Complaint:  CAD   History of Present Illness:    Brenda Davis is a 53 y.o. female with moderate CAD, diabetes, hypertension, hyperlipidemia, ESRD on PD here for follow up.  She was initially seen in 2018 for an evaluation of palpitations.  She saw Dr. Marval Regal and reported palpitations. She was referred to cardiology for further evaluation.  Whenever she is on dialysis she has palpitations during the last two hours.  It feels like her heart is fluttering and she feels like she is going to pass out.  It gets progressively worse during that time period and she gets short of breath.  Once she stops HD the symptoms subside.  Her BP is typically in the 774J systolic.  It drops to the 100s while on hemodialysis.   She wore a 48-hour Holter that showed no arrhythmias and occasional PACs.  She had an echo 01/04/15 revealed LVEF 65-70% with grade 2 diastolic dysfunction and mild mitral regurgitation.  She presented to the hospital 06/2018 with a NSTEMI. She underwent  left heart cath and was found to have moderate disease.  FFR of the RCA was within normal limits.  Medical therapy was recommended.  She was started on Imdur and atorvastatin.  However she did not tolerate nitrates.  She followed up with Jory Sims, DNP and was started on gabapentin for concern for neuropathic pain.  Since that time she has been feeling much better.  She has needed nitro at approximately twice per month.  She only takes 1 tablet when it occurs.  Her chest pain always occurs at rest.  She has not been getting much exercise lately.  She notes that she has gained 30 pounds.  She plans to start walking her lunch break and working on her diet.  She has been trying to limit fried foods but craves them.  She checks her blood pressure twice daily.  It is typically low in the morning when she finishes her peritoneal dialysis.  In the afternoon it is higher, but it stays below the 287O systolic.  Her nephrologist Dr. Atorvastatin in September due to elevated LFTs.  She reports that they normalized on repeat lab evaluation.   The patient does not have symptoms concerning for COVID-19 infection (fever, chills, cough, or new shortness of breath).    Past Medical History:  Diagnosis Date  . Anemia   . CAD (coronary artery disease)    a. cath in 02/2018 showing moderate 3-vessel CAD with 45% mid-LADm 55% LCx and 65% RCA stenosis which was not significant by FFR  .  ESRD on dialysis ([place])   . Gout   . HCAP (healthcare-associated pneumonia) 05/06/2016  . History of blood transfusion    "related to surgery"  . Hyperlipidemia   . Hypertension   . Morbid obesity ([place])   . Pain    LEFT SHOULDER PAIN - WAS SEEN AT AN URGENT CARE - GIVEN SLING FOR COMFORT AND TOLD ROM AS TOLERATED.  Marland Kitchen Palpitations 09/24/2016  . Type II diabetes mellitus ([place])    "gastric sleeve OR corrected this" (05/06/2016)   Past Surgical History:  Procedure Laterality Date  . AV FISTULA PLACEMENT Left 01/03/2015    Procedure: BRACHIAL CEPHALIC ARTERIOVENOUS  FISTULA CREATION LEFT ARM;  Surgeon: Angelia Mould, MD;  Location: [facility];  Service: Vascular;  Laterality: Left;  . CARPAL TUNNEL RELEASE Right   . [place]  . CHOLECYSTECTOMY  09/14/2017  . ESOPHAGOGASTRODUODENOSCOPY N/A 09/04/2012   Procedure: ESOPHAGOGASTRODUODENOSCOPY (EGD);  Surgeon: Shann Medal, MD;  Location: [facility];  Service: General;  Laterality: N/A;  PF  . ESOPHAGOGASTRODUODENOSCOPY (EGD) WITH ESOPHAGEAL DILATION N/A 09/29/2012   Procedure: ESOPHAGOGASTRODUODENOSCOPY (EGD) WITH ESOPHAGEAL DILATION;  Surgeon: Milus Banister, MD;  Location: [facility];  Service: Endoscopy;  Laterality: N/A;  . INSERTION OF DIALYSIS CATHETER N/A 01/03/2015   Procedure: INSERTION OF DIALYSIS CATHETER RIGHT INTERNAL JUGULAR;  Surgeon: Angelia Mould, MD;  Location: [facility];  Service: Vascular;  Laterality: N/A;  . LAPAROSCOPIC GASTRIC SLEEVE RESECTION N/A 07/19/2012   Procedure: LAPAROSCOPIC SLEEVE GASTRECTOMY with EGD;  Surgeon: Madilyn Hook, DO;  Location: [facility];  Service: General;  Laterality: N/A;  laparoscopic sleeve gastrectomy with EGD  . LEFT HEART CATH AND CORONARY ANGIOGRAPHY N/A 02/25/2018   Procedure: LEFT HEART CATH AND CORONARY ANGIOGRAPHY;  Surgeon: Leonie Man, MD;  Location: [facility];  Service: Cardiovascular;  Laterality: N/A;  . LEFT HEART CATH AND CORONARY ANGIOGRAPHY N/A 06/20/2018   Procedure: LEFT HEART CATH AND CORONARY ANGIOGRAPHY;  Surgeon: Nelva Bush, MD;  Location: [facility];  Service: Cardiovascular;  Laterality: N/A;  . PERIPHERAL VASCULAR CATHETERIZATION Left 05/23/2015   Procedure: Nolon Stalls;  Surgeon: Conrad Tolar, MD;  Location: [facility];  Service: Cardiovascular;  Laterality: Left;  upper aRM  . PERIPHERAL VASCULAR CATHETERIZATION Left 05/23/2015   Procedure: Peripheral Vascular Balloon Angioplasty;  Surgeon: Conrad Fredonia, MD;  Location: [facility];  Service: Cardiovascular;  Laterality: Left;  av fistula  . TUBAL LIGATION  1993  . UPPER GI ENDOSCOPY N/A 07/19/2012   Procedure: UPPER GI ENDOSCOPY;  Surgeon: Madilyn Hook, DO;  Location: [facility];  Service: General;  Laterality: N/A;     Current Meds  Medication Sig  . acetaminophen (TYLENOL) 325 MG tablet Take 650 mg by mouth every 6 (six) hours as needed for mild pain.  Marland Kitchen aspirin EC 81 MG EC tablet Take 1 tablet (81 mg total) by mouth daily.  Lorin Picket 1 GM 210 MG(Fe) tablet Take 210 mg by mouth 3 (three) times daily with meals.   . calcium acetate (PHOSLO) 667 MG capsule Take 667 mg by mouth 3 (three) times daily with meals.  . cinacalcet (SENSIPAR) 30 MG tablet Take 1 tablet (30 mg total) by mouth daily with supper.  . Cyanocobalamin (B-12 PO) Take 1 tablet by mouth daily.  . folic acid (FOLVITE) 1 MG tablet Take 1 tablet (1 mg total) by mouth daily.  Marland Kitchen gabapentin (NEURONTIN) 100 MG capsule Take 100 mg by  mouth 2 (two) times daily.  . nitroGLYCERIN (NITROSTAT) 0.4 MG SL tablet PLACE 1 TABLET (0.4 MG TOTAL) UNDER THE TONGUE EVERY 5 (FIVE) MINUTES AS NEEDED FOR CHEST PAIN.  Marland Kitchen ondansetron (ZOFRAN ODT) 8 MG disintegrating tablet Take 1 tablet (8 mg total) by mouth every 8 (eight) hours as needed for nausea or vomiting.     Allergies:   Amlodipine, Atorvastatin, Clonidine derivatives, Doxycycline, and Welchol [colesevelam hcl]   Social History   Tobacco Use  . Smoking status: Former Smoker    Packs/day: 1.00    Years: 16.00    Pack years: 16.00    Types: Cigarettes    Quit date: 2009    Years since quitting: 11.9  . Smokeless tobacco: Never Used  Substance Use Topics  . Alcohol use: Never    Frequency: Never  . Drug use: No     Family Hx: The patient's family history includes Arthritis in an other family member; Diabetes in her father; Hyperlipidemia in an other family member; Hypertension in her mother; Mitral valve prolapse in her mother. There is no history of Cancer,  Heart disease, Kidney disease, or Stroke.  ROS:   Please see the history of present illness.     All other systems reviewed and are negative.   Prior CV studies:   The following studies were reviewed today:  Echo 01/04/2015: Study Conclusions  - Left ventricle: The cavity size was normal. Wall thickness was   normal. Systolic function was vigorous. The estimated ejection   fraction was in the range of 65% to 70%. Features are consistent   with a pseudonormal left ventricular filling pattern, with   concomitant abnormal relaxation and increased filling pressure   (grade 2 diastolic dysfunction). - Mitral valve: There was mild regurgitation. - Pericardium, extracardiac: A trivial pericardial effusion was   identified.   48 Hour Holter Monitor 10/08/2016:  Quality: Fair.  Baseline artifact. Predominant rhythm: sinus rhythm Average heart rate: 64 bpm Max heart rate: 94 bpm Min heart rate: 50 bpm  Occasional PACs were noted that did not correspond with the fluttering reported by the patient.  LHC 06/20/18: Conclusions: 1. Moderately severe multivessel coronary artery disease not significantly changed since prior catheterization in 02/2018.  DFR of the most severe lesion in the RCA at that time was not hemodynamically significant. 2. Normal left ventricular filling pressure. 3. Hyperdynamic left ventricular contraction.  Recommendations: 1. Medical therapy for moderate diffuse coronary artery disease.  There is no clear culprit for PCI.  I suspect troponin elevation reflects supply-demand mismatch.  I will start isosorbide mononitrate 30 mg daily, which can be escalated as blood pressure tolerates for relief of chest pain. 2. Aggressive secondary prevention, including high-intensity statin therapy. 3. If no evidence of bleeding complications and no recurrence of chest pain, discharge this afternoon or tomorrow could be considered from a cardiac standpoint.  Labs/Other Tests  and Data Reviewed:    EKG:  No ECG reviewed.  Recent Labs: 04/18/2018: TSH 3.798 04/23/2018: Magnesium 1.9 06/18/2018: ALT 22; B Natriuretic Peptide 110.2 06/21/2018: BUN 45; Creatinine, Ser 11.77; Hemoglobin 10.3; Platelets 290; Potassium 3.5; Sodium 140   Recent Lipid Panel Lab Results  Component Value Date/Time   CHOL 218 (H) 05/19/2018 09:53 AM   TRIG 122 05/19/2018 09:53 AM   HDL 47 05/19/2018 09:53 AM   CHOLHDL 4.6 (H) 05/19/2018 09:53 AM   CHOLHDL 5.9 02/26/2018 05:09 AM   LDLCALC 147 (H) 05/19/2018 09:53 AM   LDLDIRECT 161.6 11/28/2012 10:42  AM    Wt Readings from Last 3 Encounters:  12/12/18 272 lb (123.4 kg)  11/23/18 282 lb (127.9 kg)  07/25/18 247 lb (112 kg)     Objective:    Vital Signs:  BP 128/90   Ht 5' 5"  (1.651 m)   Wt 272 lb (123.4 kg)   BMI 45.26 kg/m    VITAL SIGNS:  reviewed GEN:  no acute distress EYES:  sclerae anicteric, EOMI - Extraocular Movements Intact RESPIRATORY:  normal respiratory effort, symmetric expansion CARDIOVASCULAR:  no peripheral edema SKIN:  no rash, lesions or ulcers. MUSCULOSKELETAL:  no obvious deformities. NEURO:  alert and oriented x 3, no obvious focal deficit PSYCH:  normal affect  ASSESSMENT & PLAN:    # Moderate CAD: # Hyperlipidemia: Ms. Feliberto Gottron does continue to have occasional chest pain.  However it does not seem to be ischemic.  Her symptoms have improved significantly with gabapentin and it never occurs with exertion.  Continue gabapentin.  She has nitroglycerin to take as needed.  Atorvastatin was discontinued due to transaminitis.  We will try low-dose rosuvastatin 10 mg.  Check LFTs in 2 weeks.  Repeat lipids and CMP in 2 months.  Continue aspirin.  # ESRD: On PD and doing well.  # Essential hypertension:  Controlled.  # Morbid obesity:  She reports a 30lb weight gain.  She was encouraged to make 1 dietary change and increase exercise to 150 minutes weekly.  COVID-19 Education: The signs and  symptoms of COVID-19 were discussed with the patient and how to seek care for testing (follow up with PCP or arrange E-visit).  The importance of social distancing was discussed today.  Time:   Today, I have spent 22 minutes with the patient with telehealth technology discussing the above problems.     Medication Adjustments/Labs and Tests Ordered: Current medicines are reviewed at length with the patient today.  Concerns regarding medicines are outlined above.   Tests Ordered: Orders Placed This Encounter  Procedures  . Hepatic function panel  . Lipid Profile  . Comprehensive Metabolic Panel (CMET)    Medication Changes: Meds ordered this encounter  Medications  . rosuvastatin (CRESTOR) 10 MG tablet    Sig: Take 1 tablet (10 mg total) by mouth daily.    Dispense:  90 tablet    Refill:  3    Follow Up:  Either In Person or Virtual in 6 month(s)  Signed, Skeet Latch, MD  12/12/2018 8:39 AM    [place]

## 2018-12-26 LAB — COMPREHENSIVE METABOLIC PANEL WITH GFR
ALT: 11 IU/L (ref 0–32)
AST: 13 IU/L (ref 0–40)
Albumin/Globulin Ratio: 1.6 (ref 1.2–2.2)
Albumin: 3.8 g/dL (ref 3.8–4.9)
Alkaline Phosphatase: 124 IU/L — ABNORMAL HIGH (ref 39–117)
BUN/Creatinine Ratio: 5 — ABNORMAL LOW (ref 9–23)
BUN: 64 mg/dL — ABNORMAL HIGH (ref 6–24)
Bilirubin Total: 0.3 mg/dL (ref 0.0–1.2)
CO2: 21 mmol/L (ref 20–29)
Calcium: 7.9 mg/dL — ABNORMAL LOW (ref 8.7–10.2)
Chloride: 96 mmol/L (ref 96–106)
Creatinine, Ser: 14.19 mg/dL — ABNORMAL HIGH (ref 0.57–1.00)
GFR calc Af Amer: 3 mL/min/1.73 — ABNORMAL LOW
GFR calc non Af Amer: 3 mL/min/1.73 — ABNORMAL LOW
Globulin, Total: 2.4 g/dL (ref 1.5–4.5)
Glucose: 172 mg/dL — ABNORMAL HIGH (ref 65–99)
Potassium: 5.3 mmol/L — ABNORMAL HIGH (ref 3.5–5.2)
Sodium: 141 mmol/L (ref 134–144)
Total Protein: 6.2 g/dL (ref 6.0–8.5)

## 2018-12-26 LAB — HEPATIC FUNCTION PANEL
ALT: 11 IU/L (ref 0–32)
AST: 13 IU/L (ref 0–40)
Albumin: 3.8 g/dL (ref 3.8–4.9)
Alkaline Phosphatase: 125 IU/L — ABNORMAL HIGH (ref 39–117)
Bilirubin Total: 0.2 mg/dL (ref 0.0–1.2)
Bilirubin, Direct: 0.13 mg/dL (ref 0.00–0.40)
Total Protein: 6.3 g/dL (ref 6.0–8.5)

## 2018-12-26 LAB — LIPID PANEL
Chol/HDL Ratio: 4.3 ratio (ref 0.0–4.4)
Cholesterol, Total: 191 mg/dL (ref 100–199)
HDL: 44 mg/dL (ref >39)
LDL Chol Calc (NIH): 109 mg/dL — ABNORMAL HIGH (ref 0–99)
Triglycerides: 218 mg/dL — ABNORMAL HIGH (ref 0–149)
VLDL Cholesterol Cal: 38 mg/dL (ref 5–40)

## 2018-12-30 ENCOUNTER — Ambulatory Visit: Payer: Self-pay | Admitting: Internal Medicine

## 2018-12-30 ENCOUNTER — Telehealth: Payer: Self-pay | Admitting: Cardiovascular Disease

## 2018-12-30 NOTE — Telephone Encounter (Signed)
Pt advised that per Dr. Oval Linsey she should call her PCP and see if she can take any alternative to the Gabapentin and if she develops exertional cheat pain.. she should go to the ED and pt greed and verbalized understanding.

## 2018-12-30 NOTE — Telephone Encounter (Signed)
Called pt back and she says that she has been having continued pain in her chest mostly at rest.. it wakes her up at night and 2 days she had to take 2 nitro for relief and last night she woke up again with a sharp pain in her chest and it was relieved with one nitro. She is feeling well today.   Pt reports that she never started the Isosorbide 30 mg as prescribed by Dr. Oval Linsey.. her Nephrologist had recommended she not take it due to her Renal failure.   She has tried Gabapentin in the past but she says she could not tolerate it snce it may her dizzy.   I will forward to [facility] for review and recommendation... pt advised to continue to monitor and to use her Nitro as needed.. she will go to the ED if her pain worsens or persists.

## 2018-12-30 NOTE — Telephone Encounter (Signed)
Pt c/o of Chest Pain: STAT if CP now or developed within 24 hours  1. Are you having CP right now? no  2. Are you experiencing any other symptoms (ex. SOB, nausea, vomiting, sweating)?   3. How long have you been experiencing CP? Since Thanksgiving  4. Is your CP continuous or coming and going? Comes and goes  5. Have you taken Nitroglycerin? Yes. Patient took one last night ? Patient has had on and off chest pain, normally when she lays down to go to sleep. Last night was the first time she needed to take a Nitro pill. She was hoping to be seen today

## 2018-12-30 NOTE — Telephone Encounter (Signed)
Pt reports H/O "Blockages and elevated troponin levels.States hospitalized twice, last 05/2018. States was  told "The small arteries around he larger ones are blocked." Reports episode of CP Saturday night, Sunday AM and Thursday AM, at rest, mid chest. States "Heart fluttering and nausea" when occurs. States had episode prior to calling; called cardiologist who told her to call her PCP "To change my medications." States  "They told me to call my medical doctor because the pain happens when I'm resting and he may want to change my medications."  States was placed  on Gabapentin for the chest pain. Pt states "This is the same pain I had before with the blockages." Pt directed to ED, states will follow disposition. Care advise given per protocol, verbalizes understanding.   Reason for Disposition . [1] Chest pain (or "angina") comes and goes AND [2] is happening more often (increasing in frequency) or getting worse (increasing in severity)  Answer Assessment - Initial Assessment Questions 1. LOCATION: "Where does it hurt?"       Mid chest 2. RADIATION: "Does the pain go anywhere else?" (e.g., into neck, jaw, arms, back)     "SAme as before" 3. ONSET: "When did the chest pain begin?" (Minutes, hours or days)     Saturday 4. PATTERN "Does the pain come and go, or has it been constant since it started?"  "Does it get worse with exertion?"      *No Answer* 5. DURATION: "How long does it last" (e.g., seconds, minutes, hours)     *No Answer* 6. SEVERITY: "How bad is the pain?"  (e.g., Scale 1-10; mild, moderate, or severe)    - MILD (1-3): doesn't interfere with normal activities     - MODERATE (4-7): interferes with normal activities or awakens from sleep    - SEVERE (8-10): excruciating pain, unable to do any normal activities       *No Answer* 7. CARDIAC RISK FACTORS: "Do you have any history of heart problems or risk factors for heart disease?" (e.g., angina, prior heart attack; diabetes, high  blood pressure, high cholesterol, smoker, or strong family history of heart disease)    YEs, see triage summary 8. PULMONARY RISK FACTORS: "Do you have any history of lung disease?"  (e.g., blood clots in lung, asthma, emphysema, birth control pills)     *No Answer* 9. CAUSE: "What do you think is causing the chest pain?"     *No Answer* 10. OTHER SYMPTOMS: "Do you have any other symptoms?" (e.g., dizziness, nausea, vomiting, sweating, fever, difficulty breathing, cough)      Nausea, "Fluttering of heart" 11. PREGNANCY: "Is there any chance you are pregnant?" "When was your last menstrual period?"       *No Answer*  Protocols used: CHEST PAIN-A-AH

## 2019-01-05 ENCOUNTER — Telehealth: Payer: Self-pay | Admitting: *Deleted

## 2019-01-05 DIAGNOSIS — E785 Hyperlipidemia, unspecified: Secondary | ICD-10-CM

## 2019-01-05 DIAGNOSIS — I10 Essential (primary) hypertension: Secondary | ICD-10-CM

## 2019-01-05 DIAGNOSIS — Z5181 Encounter for therapeutic drug level monitoring: Secondary | ICD-10-CM

## 2019-01-05 NOTE — Telephone Encounter (Signed)
Released in my chart with Dr Blenda Mounts comments attached and patient has viewed.  Mailed lab orders for recheck

## 2019-01-05 NOTE — Telephone Encounter (Signed)
-----   Message from Skeet Latch, MD sent at 01/04/2019  2:54 PM EST ----- Cholesterol levels are a little better, though triglycerides are high.  Her liver enzymes are not elevated this time.  Lets give the rosuvastatin more time to work and repeat lipids/CMP in 3 months.  Glucose is elevated.  Please work with PCP on this.  Potassium is mildly elevated which will be addressed with dialysis.

## 2019-01-15 ENCOUNTER — Encounter (HOSPITAL_COMMUNITY): Payer: Self-pay | Admitting: Emergency Medicine

## 2019-01-15 ENCOUNTER — Inpatient Hospital Stay (HOSPITAL_COMMUNITY)
Admission: EM | Admit: 2019-01-15 | Discharge: 2019-01-23 | DRG: 246 | Disposition: A | Discharge status: Home Health | Payer: Medicare Other | Attending: Internal Medicine | Admitting: Internal Medicine

## 2019-01-15 ENCOUNTER — Other Ambulatory Visit: Payer: Self-pay

## 2019-01-15 ENCOUNTER — Emergency Department (HOSPITAL_COMMUNITY): Payer: Medicare Other

## 2019-01-15 DIAGNOSIS — I5032 Chronic diastolic (congestive) heart failure: Secondary | ICD-10-CM

## 2019-01-15 DIAGNOSIS — R58 Hemorrhage, not elsewhere classified: Secondary | ICD-10-CM | POA: Diagnosis not present

## 2019-01-15 DIAGNOSIS — Z79899 Other long term (current) drug therapy: Secondary | ICD-10-CM

## 2019-01-15 DIAGNOSIS — R778 Other specified abnormalities of plasma proteins: Secondary | ICD-10-CM

## 2019-01-15 DIAGNOSIS — J9601 Acute respiratory failure with hypoxia: Secondary | ICD-10-CM | POA: Diagnosis not present

## 2019-01-15 DIAGNOSIS — Z20822 Contact with and (suspected) exposure to covid-19: Secondary | ICD-10-CM | POA: Diagnosis present

## 2019-01-15 DIAGNOSIS — T8111XA Postprocedural  cardiogenic shock, initial encounter: Secondary | ICD-10-CM | POA: Diagnosis not present

## 2019-01-15 DIAGNOSIS — S35511A Injury of right iliac artery, initial encounter: Secondary | ICD-10-CM | POA: Diagnosis not present

## 2019-01-15 DIAGNOSIS — I25119 Atherosclerotic heart disease of native coronary artery with unspecified angina pectoris: Secondary | ICD-10-CM

## 2019-01-15 DIAGNOSIS — E861 Hypovolemia: Secondary | ICD-10-CM | POA: Diagnosis not present

## 2019-01-15 DIAGNOSIS — Z6841 Body Mass Index (BMI) 40.0 and over, adult: Secondary | ICD-10-CM

## 2019-01-15 DIAGNOSIS — M109 Gout, unspecified: Secondary | ICD-10-CM | POA: Diagnosis present

## 2019-01-15 DIAGNOSIS — E872 Acidosis: Secondary | ICD-10-CM | POA: Diagnosis not present

## 2019-01-15 DIAGNOSIS — I9581 Postprocedural hypotension: Secondary | ICD-10-CM | POA: Diagnosis present

## 2019-01-15 DIAGNOSIS — E874 Mixed disorder of acid-base balance: Secondary | ICD-10-CM | POA: Diagnosis not present

## 2019-01-15 DIAGNOSIS — I132 Hypertensive heart and chronic kidney disease with heart failure and with stage 5 chronic kidney disease, or end stage renal disease: Secondary | ICD-10-CM

## 2019-01-15 DIAGNOSIS — I25110 Atherosclerotic heart disease of native coronary artery with unstable angina pectoris: Secondary | ICD-10-CM | POA: Diagnosis not present

## 2019-01-15 DIAGNOSIS — Z7982 Long term (current) use of aspirin: Secondary | ICD-10-CM

## 2019-01-15 DIAGNOSIS — E1122 Type 2 diabetes mellitus with diabetic chronic kidney disease: Secondary | ICD-10-CM | POA: Diagnosis present

## 2019-01-15 DIAGNOSIS — I214 Non-ST elevation (NSTEMI) myocardial infarction: Secondary | ICD-10-CM | POA: Diagnosis present

## 2019-01-15 DIAGNOSIS — Z91128 Patient's intentional underdosing of medication regimen for other reason: Secondary | ICD-10-CM

## 2019-01-15 DIAGNOSIS — J9602 Acute respiratory failure with hypercapnia: Secondary | ICD-10-CM | POA: Diagnosis not present

## 2019-01-15 DIAGNOSIS — R9431 Abnormal electrocardiogram [ECG] [EKG]: Secondary | ICD-10-CM | POA: Diagnosis not present

## 2019-01-15 DIAGNOSIS — Z833 Family history of diabetes mellitus: Secondary | ICD-10-CM

## 2019-01-15 DIAGNOSIS — R197 Diarrhea, unspecified: Secondary | ICD-10-CM | POA: Diagnosis not present

## 2019-01-15 DIAGNOSIS — I2510 Atherosclerotic heart disease of native coronary artery without angina pectoris: Secondary | ICD-10-CM | POA: Diagnosis not present

## 2019-01-15 DIAGNOSIS — Z9884 Bariatric surgery status: Secondary | ICD-10-CM

## 2019-01-15 DIAGNOSIS — Z881 Allergy status to other antibiotic agents status: Secondary | ICD-10-CM

## 2019-01-15 DIAGNOSIS — K661 Hemoperitoneum: Secondary | ICD-10-CM | POA: Diagnosis not present

## 2019-01-15 DIAGNOSIS — Z8249 Family history of ischemic heart disease and other diseases of the circulatory system: Secondary | ICD-10-CM

## 2019-01-15 DIAGNOSIS — R579 Shock, unspecified: Secondary | ICD-10-CM | POA: Diagnosis not present

## 2019-01-15 DIAGNOSIS — R109 Unspecified abdominal pain: Secondary | ICD-10-CM | POA: Diagnosis present

## 2019-01-15 DIAGNOSIS — Z452 Encounter for adjustment and management of vascular access device: Secondary | ICD-10-CM

## 2019-01-15 DIAGNOSIS — N2581 Secondary hyperparathyroidism of renal origin: Secondary | ICD-10-CM | POA: Diagnosis present

## 2019-01-15 DIAGNOSIS — F17210 Nicotine dependence, cigarettes, uncomplicated: Secondary | ICD-10-CM | POA: Diagnosis not present

## 2019-01-15 DIAGNOSIS — D62 Acute posthemorrhagic anemia: Secondary | ICD-10-CM | POA: Diagnosis not present

## 2019-01-15 DIAGNOSIS — Y718 Miscellaneous cardiovascular devices associated with adverse incidents, not elsewhere classified: Secondary | ICD-10-CM | POA: Diagnosis not present

## 2019-01-15 DIAGNOSIS — K7460 Unspecified cirrhosis of liver: Secondary | ICD-10-CM

## 2019-01-15 DIAGNOSIS — Z888 Allergy status to other drugs, medicaments and biological substances status: Secondary | ICD-10-CM

## 2019-01-15 DIAGNOSIS — E8729 Other acidosis: Secondary | ICD-10-CM | POA: Diagnosis not present

## 2019-01-15 DIAGNOSIS — Z955 Presence of coronary angioplasty implant and graft: Secondary | ICD-10-CM

## 2019-01-15 DIAGNOSIS — T466X6A Underdosing of antihyperlipidemic and antiarteriosclerotic drugs, initial encounter: Secondary | ICD-10-CM | POA: Diagnosis present

## 2019-01-15 DIAGNOSIS — J4550 Severe persistent asthma, uncomplicated: Secondary | ICD-10-CM | POA: Diagnosis present

## 2019-01-15 DIAGNOSIS — I9589 Other hypotension: Secondary | ICD-10-CM | POA: Diagnosis not present

## 2019-01-15 DIAGNOSIS — Y840 Cardiac catheterization as the cause of abnormal reaction of the patient, or of later complication, without mention of misadventure at the time of the procedure: Secondary | ICD-10-CM | POA: Diagnosis not present

## 2019-01-15 DIAGNOSIS — E785 Hyperlipidemia, unspecified: Secondary | ICD-10-CM | POA: Diagnosis present

## 2019-01-15 DIAGNOSIS — E871 Hypo-osmolality and hyponatremia: Secondary | ICD-10-CM | POA: Diagnosis not present

## 2019-01-15 DIAGNOSIS — G8929 Other chronic pain: Secondary | ICD-10-CM | POA: Diagnosis present

## 2019-01-15 DIAGNOSIS — I97630 Postprocedural hematoma of a circulatory system organ or structure following a cardiac catheterization: Secondary | ICD-10-CM | POA: Diagnosis not present

## 2019-01-15 DIAGNOSIS — E669 Obesity, unspecified: Secondary | ICD-10-CM | POA: Diagnosis present

## 2019-01-15 DIAGNOSIS — Z992 Dependence on renal dialysis: Secondary | ICD-10-CM

## 2019-01-15 DIAGNOSIS — E875 Hyperkalemia: Secondary | ICD-10-CM | POA: Diagnosis present

## 2019-01-15 DIAGNOSIS — Z87891 Personal history of nicotine dependence: Secondary | ICD-10-CM

## 2019-01-15 DIAGNOSIS — E7800 Pure hypercholesterolemia, unspecified: Secondary | ICD-10-CM | POA: Diagnosis present

## 2019-01-15 DIAGNOSIS — R7989 Other specified abnormal findings of blood chemistry: Secondary | ICD-10-CM

## 2019-01-15 DIAGNOSIS — E1129 Type 2 diabetes mellitus with other diabetic kidney complication: Secondary | ICD-10-CM | POA: Diagnosis present

## 2019-01-15 DIAGNOSIS — N186 End stage renal disease: Secondary | ICD-10-CM | POA: Diagnosis present

## 2019-01-15 DIAGNOSIS — Z8261 Family history of arthritis: Secondary | ICD-10-CM

## 2019-01-15 DIAGNOSIS — Z794 Long term (current) use of insulin: Secondary | ICD-10-CM | POA: Diagnosis not present

## 2019-01-15 HISTORY — DX: Infection and inflammatory reaction due to peritoneal dialysis catheter, initial encounter: T85.71XA

## 2019-01-15 LAB — BASIC METABOLIC PANEL
Anion gap: 22 — ABNORMAL HIGH (ref 5–15)
BUN: 69 mg/dL — ABNORMAL HIGH (ref 6–20)
CO2: 19 mmol/L — ABNORMAL LOW (ref 22–32)
Calcium: 8.2 mg/dL — ABNORMAL LOW (ref 8.9–10.3)
Chloride: 95 mmol/L — ABNORMAL LOW (ref 98–111)
Creatinine, Ser: 15.95 mg/dL — ABNORMAL HIGH (ref 0.44–1.00)
GFR calc Af Amer: 3 mL/min — ABNORMAL LOW (ref >60)
GFR calc non Af Amer: 2 mL/min — ABNORMAL LOW (ref >60)
Glucose, Bld: 246 mg/dL — ABNORMAL HIGH (ref 70–99)
Potassium: 4.4 mmol/L (ref 3.5–5.1)
Sodium: 136 mmol/L (ref 135–145)

## 2019-01-15 LAB — CBC
HCT: 36.4 % (ref 36.0–46.0)
Hemoglobin: 11.5 g/dL — ABNORMAL LOW (ref 12.0–15.0)
MCH: 33.6 pg (ref 26.0–34.0)
MCHC: 31.6 g/dL (ref 30.0–36.0)
MCV: 106.4 fL — ABNORMAL HIGH (ref 80.0–100.0)
Platelets: 351 10*3/uL (ref 150–400)
RBC: 3.42 MIL/uL — ABNORMAL LOW (ref 3.87–5.11)
RDW: 13.2 % (ref 11.5–15.5)
WBC: 10.8 10*3/uL — ABNORMAL HIGH (ref 4.0–10.5)
nRBC: 0 % (ref 0.0–0.2)

## 2019-01-15 LAB — HEPATIC FUNCTION PANEL
ALT: 17 U/L (ref 0–44)
AST: 15 U/L (ref 15–41)
Albumin: 3.5 g/dL (ref 3.5–5.0)
Alkaline Phosphatase: 98 U/L (ref 38–126)
Bilirubin, Direct: 0.1 mg/dL (ref 0.0–0.2)
Indirect Bilirubin: 0.2 mg/dL — ABNORMAL LOW (ref 0.3–0.9)
Total Bilirubin: 0.3 mg/dL (ref 0.3–1.2)
Total Protein: 7.1 g/dL (ref 6.5–8.1)

## 2019-01-15 LAB — I-STAT BETA HCG BLOOD, ED (MC, WL, AP ONLY): I-stat hCG, quantitative: <5 m[IU]/mL (ref <5)

## 2019-01-15 LAB — TROPONIN I (HIGH SENSITIVITY)
Troponin I (High Sensitivity): 43 ng/L — ABNORMAL HIGH (ref <18)
Troponin I (High Sensitivity): 781 ng/L (ref <18)

## 2019-01-15 MED ORDER — ONDANSETRON HCL 4 MG/2ML IJ SOLN
4.0000 mg | Freq: Four times a day (QID) | INTRAMUSCULAR | Status: DC | PRN
  Administered 2019-01-19 – 2019-01-20 (×2): 4 mg via INTRAVENOUS
  Filled 2019-01-15 (×2): qty 2

## 2019-01-15 MED ORDER — HEPARIN BOLUS VIA INFUSION
4000.0000 [IU] | Freq: Once | INTRAVENOUS | Status: AC
  Administered 2019-01-15: 4000 [IU] via INTRAVENOUS
  Filled 2019-01-15: qty 4000

## 2019-01-15 MED ORDER — GENTAMICIN SULFATE 0.1 % EX CREA
1.0000 "application " | TOPICAL_CREAM | CUTANEOUS | Status: DC | PRN
  Filled 2019-01-15: qty 15

## 2019-01-15 MED ORDER — NITROGLYCERIN 0.4 MG SL SUBL
0.4000 mg | SUBLINGUAL_TABLET | Freq: Once | SUBLINGUAL | Status: AC
  Administered 2019-01-15: 0.4 mg via SUBLINGUAL
  Filled 2019-01-15: qty 1

## 2019-01-15 MED ORDER — ROSUVASTATIN CALCIUM 5 MG PO TABS
10.0000 mg | ORAL_TABLET | Freq: Every day | ORAL | Status: DC
  Administered 2019-01-16 – 2019-01-23 (×8): 10 mg via ORAL
  Filled 2019-01-15 (×8): qty 2

## 2019-01-15 MED ORDER — HEPARIN (PORCINE) 25000 UT/250ML-% IV SOLN
1050.0000 [IU]/h | INTRAVENOUS | Status: DC
  Administered 2019-01-15: 1050 [IU]/h via INTRAVENOUS
  Filled 2019-01-15: qty 250

## 2019-01-15 MED ORDER — SODIUM CHLORIDE 0.9% FLUSH: 3.0000 mL | Freq: Once | INTRAVENOUS | Status: DC

## 2019-01-15 MED ORDER — FOLIC ACID 1 MG PO TABS
1.0000 mg | ORAL_TABLET | Freq: Every day | ORAL | Status: DC
  Administered 2019-01-15 – 2019-01-23 (×9): 1 mg via ORAL
  Filled 2019-01-15 (×9): qty 1

## 2019-01-15 MED ORDER — FENTANYL CITRATE (PF) 100 MCG/2ML IJ SOLN
25.0000 ug | INTRAMUSCULAR | Status: DC | PRN
  Administered 2019-01-17 – 2019-01-20 (×3): 25 ug via INTRAVENOUS
  Filled 2019-01-15 (×3): qty 2

## 2019-01-15 MED ORDER — FENTANYL CITRATE (PF) 100 MCG/2ML IJ SOLN
50.0000 ug | Freq: Once | INTRAMUSCULAR | Status: AC
  Administered 2019-01-15: 50 ug via INTRAVENOUS
  Filled 2019-01-15: qty 2

## 2019-01-15 MED ORDER — NITROGLYCERIN 0.4 MG SL SUBL: 0.4000 mg | SUBLINGUAL_TABLET | SUBLINGUAL | Status: DC | PRN

## 2019-01-15 MED ORDER — FERRIC CITRATE 1 GM 210 MG(FE) PO TABS
210.0000 mg | ORAL_TABLET | Freq: Three times a day (TID) | ORAL | Status: DC
  Administered 2019-01-16 – 2019-01-23 (×8): 210 mg via ORAL
  Filled 2019-01-15 (×23): qty 1

## 2019-01-15 MED ORDER — NITROGLYCERIN 0.4 MG SL SUBL
0.4000 mg | SUBLINGUAL_TABLET | SUBLINGUAL | Status: DC | PRN
  Administered 2019-01-16: 19:00:00 0.4 mg via SUBLINGUAL
  Filled 2019-01-15: qty 1

## 2019-01-15 MED ORDER — ACETAMINOPHEN 325 MG PO TABS
650.0000 mg | ORAL_TABLET | ORAL | Status: DC | PRN
  Administered 2019-01-16 – 2019-01-23 (×2): 650 mg via ORAL
  Filled 2019-01-15 (×5): qty 2

## 2019-01-15 MED ORDER — CINACALCET HCL 30 MG PO TABS
90.0000 mg | ORAL_TABLET | Freq: Every day | ORAL | Status: DC
  Administered 2019-01-18 – 2019-01-19 (×2): 90 mg via ORAL
  Filled 2019-01-15 (×6): qty 3

## 2019-01-15 MED ORDER — ACETAMINOPHEN 325 MG PO TABS: 650.0000 mg | ORAL_TABLET | Freq: Four times a day (QID) | ORAL | Status: DC | PRN

## 2019-01-15 MED ORDER — CALCIUM ACETATE (PHOS BINDER) 667 MG PO CAPS
667.0000 mg | ORAL_CAPSULE | Freq: Three times a day (TID) | ORAL | Status: DC
  Administered 2019-01-16 – 2019-01-19 (×8): 667 mg via ORAL
  Filled 2019-01-15 (×12): qty 1

## 2019-01-15 MED ORDER — NITROGLYCERIN 2 % TD OINT
0.5000 [in_us] | TOPICAL_OINTMENT | Freq: Four times a day (QID) | TRANSDERMAL | Status: DC
  Administered 2019-01-16 – 2019-01-23 (×17): 0.5 [in_us] via TOPICAL
  Filled 2019-01-15 (×2): qty 30

## 2019-01-15 MED ORDER — ASPIRIN EC 81 MG PO TBEC
81.0000 mg | DELAYED_RELEASE_TABLET | Freq: Every day | ORAL | Status: DC
  Administered 2019-01-15 – 2019-01-23 (×9): 81 mg via ORAL
  Filled 2019-01-15 (×9): qty 1

## 2019-01-15 MED ORDER — METOPROLOL TARTRATE 12.5 MG HALF TABLET
12.5000 mg | ORAL_TABLET | Freq: Two times a day (BID) | ORAL | Status: DC
  Administered 2019-01-15 – 2019-01-17 (×4): 12.5 mg via ORAL
  Filled 2019-01-15 (×4): qty 1

## 2019-01-15 MED ORDER — GABAPENTIN 100 MG PO CAPS
100.0000 mg | ORAL_CAPSULE | Freq: Two times a day (BID) | ORAL | Status: DC
  Administered 2019-01-15 – 2019-01-23 (×16): 100 mg via ORAL
  Filled 2019-01-15 (×16): qty 1

## 2019-01-15 NOTE — Consult Note (Signed)
Cardiology Consultation:   Patient ID: DUSTIE BRITTLE MRN: [id number]; DOB: 1965-10-03  Admit date: 01/15/2019 Date of Consult: 01/15/2019  Primary Care Provider: [name], MD Primary Cardiologist: Skeet Latch, MD  Primary Electrophysiologist:  None    Patient Profile:   Brenda Davis is a 53 y.o. female with a hx of CAD (moderate, non-significant by invasive hemodynamic evaluation in June 2020), chronic CP, who is being seen today for the evaluation of CP and elevated troponin at the request of Dr. Gilles Chiquito ([facility]).  History of Present Illness:   Brenda Davis presented to the hospital earlier today c/o CP.  Patient reports that for the last 2 weeks she has been having issues with fatigue.  Thurs and Friday she also had decreased exercise tolerance and chest pain that resolved with nitroglycerin.  She seemed to get better on Saturday, however, her symptoms returned on Sunday.  She notes that she finished her PD at about 930am.  She felt more fatigued than usual and was not hungry at that time.  She continued to not feel herself and then at 11am she developed substernal and left sided chest pain which felt like a stabbing pain.  Associated nausea, SOB and diaphoresis.  She took NTG X 1 and the pain worsened.  She took NTG a second time and the pain worsened to 10/10, no radiation.  Her husband called 21 and she was administered baby aspirin X 4 and a 3rd dose of NTG and the pain eased off.  It also improved with fentanyl in the ED.   She has elevated biomarkers; 1st HS trop was 43, repeat 781. She presented w/ similar sx in June 2020, and was found to have NSTEMI. Trop at that time peaked at  1.04.  She underwent left heart cath and was found to have moderate disease.  FFR of the RCA (worst lesion, ~65%) was within normal limits.  Medical therapy was recommended.  She was started on Imdur and atorvastatin.  However she did not  tolerate nitrates.  She followed up with Jory Sims, DNP and was started on gabapentin for concern for neuropathic pain.     Heart Pathway Score:  HEAR Score: 4  Past Medical History:  Diagnosis Date  . Anemia   . CAD (coronary artery disease)    a. cath in 02/2018 showing moderate 3-vessel CAD with 45% mid-LADm 55% LCx and 65% RCA stenosis which was not significant by FFR  . ESRD on dialysis ([place])   . Gout   . HCAP (healthcare-associated pneumonia) 05/06/2016  . History of blood transfusion    "related to surgery"  . Hyperlipidemia   . Hypertension   . Morbid obesity ([place])   . Pain    LEFT SHOULDER PAIN - WAS SEEN AT AN URGENT CARE - GIVEN SLING FOR COMFORT AND TOLD ROM AS TOLERATED.  Marland Kitchen Palpitations 09/24/2016  . Peritonitis, dialysis-associated ([place])   . Type II diabetes mellitus ([place])    "gastric sleeve OR corrected this" (05/06/2016)    Past Surgical History:  Procedure Laterality Date  . AV FISTULA PLACEMENT Left 01/03/2015   Procedure: BRACHIAL CEPHALIC ARTERIOVENOUS  FISTULA CREATION LEFT ARM;  Surgeon: Angelia Mould, MD;  Location: [place];  Service: Vascular;  Laterality: Left;  . CARPAL TUNNEL RELEASE Right   . [place]  . CHOLECYSTECTOMY  09/14/2017  . ESOPHAGOGASTRODUODENOSCOPY N/A 09/04/2012   Procedure: ESOPHAGOGASTRODUODENOSCOPY (EGD);  Surgeon: Shann Medal, MD;  [facility];  Service: General;  Laterality: N/A;  PF  . ESOPHAGOGASTRODUODENOSCOPY (EGD) WITH ESOPHAGEAL DILATION N/A 09/29/2012   Procedure: ESOPHAGOGASTRODUODENOSCOPY (EGD) WITH ESOPHAGEAL DILATION;  Surgeon: Milus Banister, MD;  Location: [facility];  Service: Endoscopy;  Laterality: N/A;  . INSERTION OF DIALYSIS CATHETER N/A 01/03/2015   Procedure: INSERTION OF DIALYSIS CATHETER RIGHT INTERNAL JUGULAR;  Surgeon: Angelia Mould, MD;  Location: [place];  Service: Vascular;  Laterality: N/A;  . LAPAROSCOPIC GASTRIC SLEEVE RESECTION N/A 07/19/2012    Procedure: LAPAROSCOPIC SLEEVE GASTRECTOMY with EGD;  Surgeon: Madilyn Hook, DO;  Location: [facility];  Service: General;  Laterality: N/A;  laparoscopic sleeve gastrectomy with EGD  . LEFT HEART CATH AND CORONARY ANGIOGRAPHY N/A 02/25/2018   Procedure: LEFT HEART CATH AND CORONARY ANGIOGRAPHY;  Surgeon: Leonie Man, MD;  Location: [place] [facility];  Service: Cardiovascular;  Laterality: N/A;  . LEFT HEART CATH AND CORONARY ANGIOGRAPHY N/A 06/20/2018   Procedure: LEFT HEART CATH AND CORONARY ANGIOGRAPHY;  Surgeon: Nelva Bush, MD;  Location: [place] [facility];  Service: Cardiovascular;  Laterality: N/A;  . PERIPHERAL VASCULAR CATHETERIZATION Left 05/23/2015   Procedure: Nolon Stalls;  Surgeon: Conrad Rodeo, MD;  Location: [place] [facility];  Service: Cardiovascular;  Laterality: Left;  upper aRM  . PERIPHERAL VASCULAR CATHETERIZATION Left 05/23/2015   Procedure: Peripheral Vascular Balloon Angioplasty;  Surgeon: Conrad [place], MD;  Location: [place] [facility];  Service: Cardiovascular;  Laterality: Left;  av fistula  . TUBAL LIGATION  1993  . UPPER GI ENDOSCOPY N/A 07/19/2012   Procedure: UPPER GI ENDOSCOPY;  Surgeon: Madilyn Hook, DO;  Location: [facility];  Service: General;  Laterality: N/A;     Home Medications:  Prior to Admission medications   Medication Sig Start Date End Date Taking? Authorizing Provider  aspirin EC 81 MG EC tablet Take 1 tablet (81 mg total) by mouth daily. 02/26/18  Yes Smith, Rondell A, MD  AURYXIA 1 GM 210 MG(Fe) tablet Take 210 mg by mouth 3 (three) times daily with meals.  04/07/17  Yes [provider]  calcium acetate (PHOSLO) 667 MG capsule Take 667 mg by mouth 3 (three) times daily with meals. 06/02/18  Yes [provider]  cinacalcet (SENSIPAR) 90 MG tablet Take 90 mg by mouth at bedtime. 12/08/18  Yes [provider]  Cyanocobalamin (B-12 PO) Take 1 tablet by mouth daily.   Yes [provider]  folic acid (FOLVITE) 1 MG tablet  Take 1 tablet (1 mg total) by mouth daily. 03/08/18  Yes [name], MD  gabapentin (NEURONTIN) 100 MG capsule Take 100 mg by mouth 2 (two) times daily. 10/31/18  Yes [provider]  gentamicin cream (GARAMYCIN) 0.1 % Apply 1 application topically See admin instructions. Apply to exit site daily as directed. 04/26/17  Yes [provider]  nitroGLYCERIN (NITROSTAT) 0.4 MG SL tablet PLACE 1 TABLET (0.4 MG TOTAL) UNDER THE TONGUE EVERY 5 (FIVE) MINUTES AS NEEDED FOR CHEST PAIN. 08/01/18  Yes Skeet Latch, MD  rosuvastatin (CRESTOR) 10 MG tablet Take 1 tablet (10 mg total) by mouth daily. 12/12/18 03/12/19 Yes Skeet Latch, MD  ondansetron (ZOFRAN ODT) 8 MG disintegrating tablet Take 1 tablet (8 mg total) by mouth every 8 (eight) hours as needed for nausea or vomiting. Patient not taking: Reported on 01/15/2019 04/09/18   Jola Schmidt, MD    Inpatient Medications: Scheduled Meds: . aspirin EC  81 mg Oral Daily  . [START ON 01/16/2019] calcium acetate  667 mg Oral TID WC  . [START ON 01/16/2019] cinacalcet  90 mg Oral Q supper  . [START ON 01/16/2019] ferric citrate  210 mg Oral TID WC  . folic acid  1 mg Oral Daily  . gabapentin  100 mg Oral BID  . gentamicin cream  1 application Topical See admin instructions  . metoprolol tartrate  12.5 mg Oral BID  . [START ON 01/16/2019] nitroGLYCERIN  0.5 inch Topical Q6H  . rosuvastatin  10 mg Oral Daily  . sodium chloride flush  3 mL Intravenous Once   Continuous Infusions: . heparin 1,050 Units/hr (01/15/19 2135)   PRN Meds: acetaminophen, acetaminophen, fentaNYL (SUBLIMAZE) injection, nitroGLYCERIN, nitroGLYCERIN, ondansetron (ZOFRAN) IV  Allergies:    Allergies  Allergen Reactions  . Amlodipine Swelling  . Atorvastatin     Elevated LFT's  . Clonidine Derivatives Swelling    Limbs swell  . Doxycycline Nausea And Vomiting    "I threw up for 3 hours"  . Welchol [Colesevelam Hcl] Nausea Only    Social History:    Social History   Socioeconomic History  . Marital status: Married    Spouse name: Not on file  . Number of children: 3  . Years of education: Not on file  . Highest education level: Not on file  Occupational History  . Occupation: Research scientist (physical sciences): [employer]  Tobacco Use  . Smoking status: Former Smoker    Packs/day: 1.00    Years: 16.00    Pack years: 16.00    Types: Cigarettes    Quit date: 2009    Years since quitting: 11.9  . Smokeless tobacco: Never Used  Substance and Sexual Activity  . Alcohol use: Never  . Drug use: No  . Sexual activity: Yes    Birth control/protection: Post-menopausal  Other Topics Concern  . Not on file  Social History Narrative  . Not on file   Social Determinants of Health   Financial Resource Strain:   . Difficulty of Paying Living Expenses: Not on file  Food Insecurity:   . Worried About Charity fundraiser in the Last Year: Not on file  . Ran Out of Food in the Last Year: Not on file  Transportation Needs:   . Lack of Transportation (Medical): Not on file  . Lack of Transportation (Non-Medical): Not on file  Physical Activity:   . Days of Exercise per Week: Not on file  . Minutes of Exercise per Session: Not on file  Stress:   . Feeling of Stress : Not on file  Social Connections:   . Frequency of Communication with Friends and Family: Not on file  . Frequency of Social Gatherings with Friends and Family: Not on file  . Attends Religious Services: Not on file  . Active Member of Clubs or Organizations: Not on file  . Attends Archivist Meetings: Not on file  . Marital Status: Not on file  Intimate Partner Violence:   . Fear of Current or Ex-Partner: Not on file  . Emotionally Abused: Not on file  . Physically Abused: Not on file  . Sexually Abused: Not on file    Family History:    Family History  Problem Relation Age of Onset  . Hypertension Mother   . Mitral valve prolapse Mother   . Diabetes Father    . Arthritis Other   . Hyperlipidemia Other   . Cancer Neg Hx   . Heart disease Neg Hx   .  Kidney disease Neg Hx   . Stroke Neg Hx      ROS:  Please see the history of present illness.   All other ROS reviewed and negative.     Physical Exam/Data:   Vitals:   01/15/19 1531 01/15/19 1715 01/15/19 2000 01/15/19 2247  BP: 116/74 (!) 146/74  138/72  Pulse: 75   64  Resp: 16 15  13   Temp: 98.2 F (36.8 C)   98.4 F (36.9 C)  TempSrc: Oral   Oral  SpO2: 97%   98%  Weight:   125 kg   Height:   5' 5"  (1.651 m)    No intake or output data in the 24 hours ending 01/15/19 2251 Last 3 Weights 01/15/2019 12/12/2018 11/23/2018  Weight (lbs) 275 lb 9.2 oz 272 lb 282 lb  Weight (kg) 125 kg 123.378 kg 127.914 kg     Body mass index is 45.86 kg/m.  General:  Well nourished, well developed, in no acute distress HEENT: normal Lymph: no adenopathy Neck: no JVD Endocrine:  No thryomegaly Vascular: No carotid bruits; DP pulses 2+ bilaterally  Cardiac:  normal S1, S2; RRR; no murmur  Lungs:  clear to auscultation bilaterally, no wheezing, rhonchi or rales  Abd: soft, nontender, no hepatomegaly  Ext: no edema Musculoskeletal:  No deformities, BUE and BLE strength normal and equal Skin: warm and dry  Neuro:  CNs 2-12 intact, no focal abnormalities noted Psych:  Normal affect   EKG:  The EKG was personally reviewed and demonstrates: NSR with no acute ischemic changes   Telemetry:  Telemetry was personally reviewed and demonstrates:  NSR  Relevant CV Studies: LHC 07-07-18 Conclusions: 1. Moderately severe multivessel coronary artery disease not significantly changed since prior catheterization in 02/2018.  DFR of the most severe lesion in the RCA at that time was not hemodynamically significant. 2. Normal left ventricular filling pressure. 3. Hyperdynamic left ventricular contraction.  Recommendations: 1. Medical therapy for moderate diffuse coronary artery disease.  There is no clear  culprit for PCI.  I suspect troponin elevation reflects supply-demand mismatch.  I will start isosorbide mononitrate 30 mg daily, which can be escalated as blood pressure tolerates for relief of chest pain. 2. Aggressive secondary prevention, including high-intensity statin therapy.  TTE 01-04-15 Left ventricle: The cavity size was normal. Wall thickness was   normal. Systolic function was vigorous. The estimated ejection   fraction was in the range of 65% to 70%. Features are consistent   with a pseudonormal left ventricular filling pattern, with   concomitant abnormal relaxation and increased filling pressure   (grade 2 diastolic dysfunction). - Mitral valve: There was mild regurgitation. - Pericardium, extracardiac: A trivial pericardial effusion was   identified.  Laboratory Data:  High Sensitivity Troponin:   Recent Labs  Lab 01/15/19 1448 01/15/19 1806  TROPONINIHS 43* 781*     Chemistry Recent Labs  Lab 01/15/19 1448  NA 136  K 4.4  CL 95*  CO2 19*  GLUCOSE 246*  BUN 69*  CREATININE 15.95*  CALCIUM 8.2*  GFRNONAA 2*  GFRAA 3*  ANIONGAP 22*    Recent Labs  Lab 01/15/19 1448  PROT 7.1  ALBUMIN 3.5  AST 15  ALT 17  ALKPHOS 98  BILITOT 0.3   Hematology Recent Labs  Lab 01/15/19 1448  WBC 10.8*  RBC 3.42*  HGB 11.5*  HCT 36.4  MCV 106.4*  MCH 33.6  MCHC 31.6  RDW 13.2  PLT 351   BNPNo results  for input(s): BNP, PROBNP in the last 168 hours.  DDimer No results for input(s): DDIMER in the last 168 hours.   Radiology/Studies:  DG Chest 2 View  Result Date: 01/15/2019 CLINICAL DATA:  Chest pain since 1 p.m., history end-stage renal disease on dialysis, hypertension, type II diabetes mellitus, coronary artery disease EXAM: CHEST - 2 VIEW COMPARISON:  06/18/2018 FINDINGS: Normal heart size, mediastinal contours, and pulmonary vascularity. Minimal subsegmental atelectasis at lingula and RIGHT base. Lungs otherwise clear. No infiltrate, pleural  effusion or pneumothorax. Bones demineralized. IMPRESSION: Minimal bibasilar atelectasis. Electronically Signed   By: Lavonia Dana M.D.   On: 01/15/2019 15:35       TIMI Risk Score for Unstable Angina or Non-ST Elevation MI:   The patient's TIMI risk score is 5, which indicates a 26% risk of all cause mortality, new or recurrent myocardial infarction or need for urgent revascularization in the next 14 days.   Assessment and Plan:   1. CP/NSTEMI: the clinical scenario is very similar to what happened this past June. Troponin is currently roughly the same as it was this summer, at which time LHC showed non-obst CAD. Medical therapy was recommended. Will tentatively plan for repeat LHC to r/o progression of disease; if CAD again found to be non-obstructive, there is likely  a component of microvascular dz +/- vasospasm contributing to pt's sx. Agree w/ initiation of BB (started by hospitalist). Cont aggressive medical tx for secondary prevention. Pt has not had TTE since 2016; will repeat to reassess LVEF.  2. HTN/dyslipidemia: recommend restart home regimen and adjust PRN.  3. ESRD: cont hemodialysis as managed by primary team/nephrology      For questions or updates, please contact [place] Please consult [email or web address] for contact info under     Signed, Rudean Curt, MD, [facility]  01/15/2019 10:51 PM

## 2019-01-15 NOTE — ED Provider Notes (Signed)
[facility] EMERGENCY DEPARTMENT Provider Note   CSN: [id number] Arrival date & time: 01/15/19  1425     History Chief Complaint  Patient presents with  . Chest Pain    Brenda Davis is a 53 y.o. female.  There is to the emergency department chief complaint chest pain.  Patient states over the past week Brenda Davis has had some intermittent episodes of chest pain, but generally had been brief and went away after a few minutes.  Today though started having chest pain around 1:00 and pain has been relatively constant since that time.  Brenda Davis tried taking nitroglycerin at home but did not have any improvement in her pain.  Took baby aspirin x4 and had some improvement in her pain.  EMS gave patient nitroglycerin and morphine and states the morphine seemed to have helped, mild nausea, one episode nonbloody nonbilious emesis.  States pain currently 6 out of 10 in severity.  Peritoneal dialysis at home.  Complete chart review notable for coronary artery disease, left heart cath in June demonstrated multivessel disease, recommending medical management.  HPI     Past Medical History:  Diagnosis Date  . Anemia   . CAD (coronary artery disease)    a. cath in 02/2018 showing moderate 3-vessel CAD with 45% mid-LADm 55% LCx and 65% RCA stenosis which was not significant by FFR  . ESRD on dialysis ([place])   . Gout   . HCAP (healthcare-associated pneumonia) 05/06/2016  . History of blood transfusion    "related to surgery"  . Hyperlipidemia   . Hypertension   . Morbid obesity ([place])   . Pain    LEFT SHOULDER PAIN - WAS SEEN AT AN URGENT CARE - GIVEN SLING FOR COMFORT AND TOLD ROM AS TOLERATED.  Marland Kitchen Palpitations 09/24/2016  . Peritonitis, dialysis-associated ([place])   . Type II diabetes mellitus ([place])    "gastric sleeve OR corrected this" (05/06/2016)    Patient Active Problem List   Diagnosis Date Noted  . Dysuria 11/25/2018  . Coronary artery disease involving native  coronary artery of native heart with unstable angina pectoris ([place])   . Hypocalcemia 06/18/2018  . Chest pain, rule out acute myocardial infarction 06/18/2018  . Localized swelling of finger of left hand 06/01/2018  . Sepsis ([place]) 05/23/2018  . C. difficile diarrhea 05/08/2018  . Diarrhea of infectious origin 05/04/2018  . Hemodialysis-associated hypotension 04/28/2018  . Elevated troponin 03/08/2018  . Folate deficiency 03/08/2018  . NSTEMI (non-ST elevated myocardial infarction) ([place])   . CAD in native artery   . Hepatic cirrhosis ([place]) 11/24/2017  . ESRD on peritoneal dialysis ([place]) 08/25/2017  . DM (diabetes mellitus), type 2 with renal complications ([place]) 78/29/5621  . Chronic heart failure with preserved ejection fraction ([place]) 06/22/2017  . Trigger finger, left middle finger 09/14/2016  . Sensorineural hearing loss (SNHL), bilateral 06/29/2016  . Allergic rhinitis 06/27/2016  . Severe persistent asthma with exacerbation 05/12/2016  . Anemia of chronic disease 05/06/2016  . Routine general medical examination at a health care facility 12/05/2015  . Cervical cancer screening 12/05/2015  . Visit for screening mammogram 01/23/2015  . Gout due to renal impairment 09/13/2014  . Thiamine deficiency 12/02/2012  . Hypokalemia 11/28/2012  . S/P laparoscopic sleeve gastrectomy 11/28/2012  . B12 deficiency anemia 11/28/2012  . Morbid obesity with BMI of 40.0-44.9, adult ([place]) 07/22/2012  . Dyslipidemia, goal LDL below 70 04/09/2008  . CKD (chronic kidney disease) stage V requiring chronic dialysis (MWF) 02/08/2008  Past Surgical History:  Procedure Laterality Date  . AV FISTULA PLACEMENT Left 01/03/2015   Procedure: BRACHIAL CEPHALIC ARTERIOVENOUS  FISTULA CREATION LEFT ARM;  Surgeon: Angelia Mould, MD;  Location: [facility];  Service: Vascular;  Laterality: Left;  . CARPAL TUNNEL RELEASE Right   . [place]  . CHOLECYSTECTOMY  09/14/2017  .  ESOPHAGOGASTRODUODENOSCOPY N/A 09/04/2012   Procedure: ESOPHAGOGASTRODUODENOSCOPY (EGD);  Surgeon: Shann Medal, MD;  Location: [facility];  Service: General;  Laterality: N/A;  PF  . ESOPHAGOGASTRODUODENOSCOPY (EGD) WITH ESOPHAGEAL DILATION N/A 09/29/2012   Procedure: ESOPHAGOGASTRODUODENOSCOPY (EGD) WITH ESOPHAGEAL DILATION;  Surgeon: Milus Banister, MD;  Location: [facility];  Service: Endoscopy;  Laterality: N/A;  . INSERTION OF DIALYSIS CATHETER N/A 01/03/2015   Procedure: INSERTION OF DIALYSIS CATHETER RIGHT INTERNAL JUGULAR;  Surgeon: Angelia Mould, MD;  Location: [facility];  Service: Vascular;  Laterality: N/A;  . LAPAROSCOPIC GASTRIC SLEEVE RESECTION N/A 07/19/2012   Procedure: LAPAROSCOPIC SLEEVE GASTRECTOMY with EGD;  Surgeon: Madilyn Hook, DO;  Location: [facility];  Service: General;  Laterality: N/A;  laparoscopic sleeve gastrectomy with EGD  . LEFT HEART CATH AND CORONARY ANGIOGRAPHY N/A 02/25/2018   Procedure: LEFT HEART CATH AND CORONARY ANGIOGRAPHY;  Surgeon: Leonie Man, MD;  Location: [facility];  Service: Cardiovascular;  Laterality: N/A;  . LEFT HEART CATH AND CORONARY ANGIOGRAPHY N/A 06/20/2018   Procedure: LEFT HEART CATH AND CORONARY ANGIOGRAPHY;  Surgeon: Nelva Bush, MD;  Location: [facility];  Service: Cardiovascular;  Laterality: N/A;  . PERIPHERAL VASCULAR CATHETERIZATION Left 05/23/2015   Procedure: Nolon Stalls;  Surgeon: Conrad Big Horn, MD;  Location: [facility];  Service: Cardiovascular;  Laterality: Left;  upper aRM  . PERIPHERAL VASCULAR CATHETERIZATION Left 05/23/2015   Procedure: Peripheral Vascular Balloon Angioplasty;  Surgeon: Conrad Woodburn, MD;  Location: [facility];  Service: Cardiovascular;  Laterality: Left;  av fistula  . TUBAL LIGATION  1993  . UPPER GI ENDOSCOPY N/A 07/19/2012   Procedure: UPPER GI ENDOSCOPY;  Surgeon: Madilyn Hook, DO;  Location: [facility];  Service: General;  Laterality: N/A;     OB History   No  obstetric history on file.     Family History  Problem Relation Age of Onset  . Hypertension Mother   . Mitral valve prolapse Mother   . Diabetes Father   . Arthritis Other   . Hyperlipidemia Other   . Cancer Neg Hx   . Heart disease Neg Hx   . Kidney disease Neg Hx   . Stroke Neg Hx     Social History   Tobacco Use  . Smoking status: Former Smoker    Packs/day: 1.00    Years: 16.00    Pack years: 16.00    Types: Cigarettes    Quit date: 2009    Years since quitting: 11.9  . Smokeless tobacco: Never Used  Substance Use Topics  . Alcohol use: Never  . Drug use: No    Home Medications Prior to Admission medications   Medication Sig Start Date End Date Taking? Authorizing Provider  aspirin EC 81 MG EC tablet Take 1 tablet (81 mg total) by mouth daily. 02/26/18  Yes Smith, Rondell A, MD  AURYXIA 1 GM 210 MG(Fe) tablet Take 210 mg by mouth 3 (three) times daily with meals.  04/07/17  Yes [provider]  calcium acetate (PHOSLO) 667 MG capsule Take 667 mg by mouth 3 (three) times daily with  meals. 06/02/18  Yes [provider]  cinacalcet (SENSIPAR) 90 MG tablet Take 90 mg by mouth at bedtime. 12/08/18  Yes [provider]  Cyanocobalamin (B-12 PO) Take 1 tablet by mouth daily.   Yes [provider]  folic acid (FOLVITE) 1 MG tablet Take 1 tablet (1 mg total) by mouth daily. 03/08/18  Yes [name], MD  gabapentin (NEURONTIN) 100 MG capsule Take 100 mg by mouth 2 (two) times daily. 10/31/18  Yes [provider]  gentamicin cream (GARAMYCIN) 0.1 % Apply 1 application topically See admin instructions. Apply to exit site daily as directed. 04/26/17  Yes [provider]  nitroGLYCERIN (NITROSTAT) 0.4 MG SL tablet PLACE 1 TABLET (0.4 MG TOTAL) UNDER THE TONGUE EVERY 5 (FIVE) MINUTES AS NEEDED FOR CHEST PAIN. 08/01/18  Yes Skeet Latch, MD  rosuvastatin (CRESTOR) 10 MG tablet Take 1 tablet (10 mg total) by mouth daily.  12/12/18 03/12/19 Yes Skeet Latch, MD  ondansetron (ZOFRAN ODT) 8 MG disintegrating tablet Take 1 tablet (8 mg total) by mouth every 8 (eight) hours as needed for nausea or vomiting. Patient not taking: Reported on 01/15/2019 04/09/18   Jola Schmidt, MD    Allergies    Amlodipine, Atorvastatin, Clonidine derivatives, Doxycycline, and Welchol [colesevelam hcl]  Review of Systems   Review of Systems  Constitutional: Negative for chills and fever.  HENT: Negative for ear pain and sore throat.   Eyes: Negative for pain and visual disturbance.  Respiratory: Positive for chest tightness. Negative for cough and shortness of breath.   Cardiovascular: Positive for chest pain. Negative for palpitations.  Gastrointestinal: Negative for abdominal pain and vomiting.  Genitourinary: Negative for dysuria and hematuria.  Musculoskeletal: Negative for arthralgias and back pain.  Skin: Negative for color change and rash.  Neurological: Negative for seizures and syncope.  All other systems reviewed and are negative.   Physical Exam Updated Vital Signs BP (!) 146/74   Pulse 75   Temp 98.2 F (36.8 C) (Oral)   Resp 15   Ht 5' 5"  (1.651 m)   Wt 125 kg   SpO2 97%   BMI 45.86 kg/m   Physical Exam Vitals and nursing note reviewed.  Constitutional:      General: Brenda Davis is not in acute distress.    Appearance: Brenda Davis is well-developed.  HENT:     Head: Normocephalic and atraumatic.  Eyes:     Conjunctiva/sclera: Conjunctivae normal.  Cardiovascular:     Rate and Rhythm: Normal rate and regular rhythm.     Heart sounds: No murmur.  Pulmonary:     Effort: Pulmonary effort is normal. No respiratory distress.     Breath sounds: Normal breath sounds.  Abdominal:     Palpations: Abdomen is soft.     Tenderness: There is no abdominal tenderness.  Musculoskeletal:     Cervical back: Neck supple.  Skin:    General: Skin is warm and dry.     Capillary Refill: Capillary refill takes less than 2  seconds.  Neurological:     General: No focal deficit present.     Mental Status: Brenda Davis is alert.     ED Results / Procedures / Treatments   Labs (all labs ordered are listed, but only abnormal results are displayed) Labs Reviewed  BASIC METABOLIC PANEL - Abnormal; Notable for the following components:      Result Value   Chloride 95 (*)    CO2 19 (*)    Glucose, Bld 246 (*)  BUN 69 (*)    Creatinine, Ser 15.95 (*)    Calcium 8.2 (*)    GFR calc non Af Amer 2 (*)    GFR calc Af Amer 3 (*)    Anion gap 22 (*)    All other components within normal limits  CBC - Abnormal; Notable for the following components:   WBC 10.8 (*)    RBC 3.42 (*)    Hemoglobin 11.5 (*)    MCV 106.4 (*)    All other components within normal limits  TROPONIN I (HIGH SENSITIVITY) - Abnormal; Notable for the following components:   Troponin I (High Sensitivity) 43 (*)    All other components within normal limits  TROPONIN I (HIGH SENSITIVITY) - Abnormal; Notable for the following components:   Troponin I (High Sensitivity) 781 (*)    All other components within normal limits  SARS CORONAVIRUS 2 (TAT 6-24 HRS)  HEPARIN LEVEL (UNFRACTIONATED)  CBC  HEPATIC FUNCTION PANEL  BASIC METABOLIC PANEL  LIPID PANEL  I-STAT BETA HCG BLOOD, ED ([facility], [facility], [facility] ONLY)    EKG EKG Interpretation  Date/Time:  Sunday January 15 2019 14:54:00 EST Ventricular Rate:  76 PR Interval:  164 QRS Duration: 72 QT Interval:  452 QTC Calculation: 508 R Axis:   13 Text Interpretation: Normal sinus rhythm Low voltage QRS Nonspecific ST abnormality Prolonged QT Abnormal ECG Confirmed by Madalyn Rob [phone]) on 01/15/2019 4:27:08 PM   Radiology DG Chest 2 View  Result Date: 01/15/2019 CLINICAL DATA:  Chest pain since 1 p.m., history end-stage renal disease on dialysis, hypertension, type II diabetes mellitus, coronary artery disease EXAM: CHEST - 2 VIEW COMPARISON:  06/18/2018 FINDINGS: Normal heart size, mediastinal  contours, and pulmonary vascularity. Minimal subsegmental atelectasis at lingula and RIGHT base. Lungs otherwise clear. No infiltrate, pleural effusion or pneumothorax. Bones demineralized. IMPRESSION: Minimal bibasilar atelectasis. Electronically Signed   By: Lavonia Dana M.D.   On: 01/15/2019 15:35    Procedures Procedures (including critical care time)  Medications Ordered in ED Medications  sodium chloride flush (NS) 0.9 % injection 3 mL (3 mLs Intravenous Not Given 01/15/19 1635)  heparin bolus via infusion 4,000 Units (has no administration in time range)  heparin ADULT infusion 100 units/mL (25000 units/212m sodium chloride 0.45%) (has no administration in time range)  acetaminophen (TYLENOL) tablet 650 mg (has no administration in time range)  aspirin EC tablet 81 mg (has no administration in time range)  nitroGLYCERIN (NITROSTAT) SL tablet 0.4 mg (has no administration in time range)  rosuvastatin (CRESTOR) tablet 10 mg (has no administration in time range)  cinacalcet (SENSIPAR) tablet 90 mg (has no administration in time range)  ferric citrate (AURYXIA) tablet 210 mg (has no administration in time range)  calcium acetate (PHOSLO) capsule 667 mg (has no administration in time range)  folic acid (FOLVITE) tablet 1 mg (has no administration in time range)  gabapentin (NEURONTIN) capsule 100 mg (has no administration in time range)  gentamicin cream (GARAMYCIN) 0.1 % 1 application (has no administration in time range)  nitroGLYCERIN (NITROSTAT) SL tablet 0.4 mg (has no administration in time range)  acetaminophen (TYLENOL) tablet 650 mg (has no administration in time range)  ondansetron (ZOFRAN) injection 4 mg (has no administration in time range)  nitroGLYCERIN (NITROGLYN) 2 % ointment 0.5 inch (has no administration in time range)  metoprolol tartrate (LOPRESSOR) tablet 12.5 mg (has no administration in time range)  fentaNYL (SUBLIMAZE) injection 25 mcg (has no administration in  time range)  fentaNYL (SUBLIMAZE) injection 50 mcg (50 mcg Intravenous Given 01/15/19 1758)  nitroGLYCERIN (NITROSTAT) SL tablet 0.4 mg (0.4 mg Sublingual Given 01/15/19 1937)    ED Course  I have reviewed the triage vital signs and the nursing notes.  Pertinent labs & imaging results that were available during my care of the patient were reviewed by me and considered in my medical decision making (see chart for details).  Clinical Course as of Jan 15 2132  Nancy Fetter Jan 15, 2019  1930 Discussed with Dr. Hassell Done, likely no acute cardiac intervention needed, recommending medical management, if needs admission plan admit hospitalist   [RD]  2015 Notified of critical troponin, will contact cardiology, admit hospitalist   [RD]  2015 Updated patient, agreeable to admission   [RD]  2036 D/w hospitalist who will admit, will start heparin for NSTEMI   [RD]  2109 Talked to [facility], they will see in consultation   [RD]    Clinical Course User Index [RD] Lucrezia Starch, MD   MDM Rules/Calculators/A&P                      53 year old lady with history of ESRD on peritoneal dialysis, coronary artery disease presents to ER with complaints of ongoing chest pain.  EKG without acute ischemic changes.  Initial troponin was only slightly elevated however second troponin much more elevated.  Reviewed with cardiology, recommending hospitalist admission, medical optimization.  Discussed case with hospitalist, Dr.Mullen will admit.  Started on heparin.  Cardiology will see in consultation.     After the discussed management above, the patient was determined to be safe for discharge.  The patient was in agreement with this plan and all questions regarding their care were answered.  ED return precautions were discussed and the patient will return to the ED with any significant worsening of condition.   Final Clinical Impression(s) / ED Diagnoses Final diagnoses:  NSTEMI (non-ST elevated myocardial  infarction) ([place])  Coronary artery disease involving native heart with angina pectoris, unspecified vessel or lesion type ([place])  Elevated troponin    Rx / DC Orders ED Discharge Orders    None       Lucrezia Starch, MD 01/15/19 2135

## 2019-01-15 NOTE — ED Notes (Signed)
ED TO INPATIENT HANDOFF REPORT  ED Nurse Name and Phone #: Aryn Safran, [phone]  S Name/Age/Gender Brenda Davis 53 y.o. female Room/Bed: 039C/039C  Code Status   Code Status: Full Code  Home/SNF/Other Home Patient oriented to: self, place, time and situation Is this baseline? Yes   Triage Complete: Triage complete  Chief Complaint NSTEMI (non-ST elevated myocardial infarction) [facility]) [I21.4]  Triage Note Pt to triage via [facility] from home.  Pt c/o chest pain since 1pm.  Took 4 baby ASA and NTG x 2 prior to EMS arrival.  Vomited x 1.  EMS administered NTG x 1 and Morphine 50m IV.  Pain relieved at this time.   Peritoneal dialysis yesterday.    Allergies Allergies  Allergen Reactions  . Amlodipine Swelling  . Atorvastatin     Elevated LFT's  . Clonidine Derivatives Swelling    Limbs swell  . Doxycycline Nausea And Vomiting    "I threw up for 3 hours"  . Welchol [Colesevelam Hcl] Nausea Only    Level of Care/Admitting Diagnosis ED Disposition    ED Disposition Condition Comment   Admit  [id number]]  Level of Care: Progressive [102]  Admit to Progressive based on following criteria: CARDIOVASCULAR & THORACIC of moderate stability with acute coronary syndrome symptoms/low risk myocardial infarction/hypertensive urgency/arrhythmias/heart failure potentially compromising stability and stable post cardiovascular intervention patients.  Covid Evaluation: Asymptomatic Screening Protocol (No Symptoms)  Diagnosis: NSTEMI (non-ST elevated myocardial infarction) ([facility]) [161096] Admitting Physician: MCresenciano Lick Attending Physician: MSid Falcon[[phone] Estimated length of stay: past midnight tomorrow  Certification:: I certify this patient will need inpatient services for at least 2 midnights       B Medical/Surgery History Past Medical History:  Diagnosis Date  . Anemia   . CAD (coronary artery disease)    a.  cath in 02/2018 showing moderate 3-vessel CAD with 45% mid-LADm 55% LCx and 65% RCA stenosis which was not significant by FFR  . ESRD on dialysis ([place]   . Gout   . HCAP (healthcare-associated pneumonia) 05/06/2016  . History of blood transfusion    "related to surgery"  . Hyperlipidemia   . Hypertension   . Morbid [place] ([place]   . Pain    LEFT SHOULDER PAIN - WAS SEEN AT AN URGENT CARE - GIVEN SLING FOR COMFORT AND TOLD ROM AS TOLERATED.  .Marland KitchenPalpitations 09/24/2016  . Per[place]itis, dialysis-associated ([place]   . Type II diabetes mellitus ([place]    "gastric sleeve OR corrected this" (05/06/2016)   Past Surgical History:  Procedure Laterality Date  . AV FISTULA PLACEMENT Left 01/03/2015   Procedure: BRACHIAL CEPHALIC ARTERIOVENOUS  FISTULA CREATION LEFT ARM;  Surgeon: CAngelia Mould MD;  [facility]  Service: Vascular;  Laterality: Left;  . CARPAL TUNNEL RELEASE Right   . [place] . CHOLECYSTECTOMY  09/14/2017  . ESOPHAGOGASTRODUODENOSCOPY N/A 09/04/2012   Procedure: ESOPHAGOGASTRODUODENOSCOPY (EGD);  Surgeon: DShann Medal MD;  [facility];  Service: General;  Laterality: N/A;  PF  . ESOPHAGOGASTRODUODENOSCOPY (EGD) WITH ESOPHAGEAL DILATION N/A 09/29/2012   Procedure: ESOPHAGOGASTRODUODENOSCOPY (EGD) WITH ESOPHAGEAL DILATION;  Surgeon: DMilus Banister MD;  [facility];  Service: Endoscopy;  Laterality: N/A;  . INSERTION OF DIALYSIS CATHETER N/A 01/03/2015   Procedure: INSERTION OF DIALYSIS CATHETER RIGHT INTERNAL JUGULAR;  Surgeon: CAngelia Mould MD;  [facility]  Service: Vascular;  Laterality: N/A;  . LAPAROSCOPIC  GASTRIC SLEEVE RESECTION N/A 07/19/2012   Procedure: LAPAROSCOPIC SLEEVE GASTRECTOMY with EGD;  Surgeon: Madilyn Hook, DO;  [facility];  Service: General;  Laterality: N/A;  laparoscopic sleeve gastrectomy with EGD  . LEFT HEART CATH AND CORONARY ANGIOGRAPHY N/A 02/25/2018   Procedure: LEFT HEART CATH AND  CORONARY ANGIOGRAPHY;  Surgeon: Leonie Man, MD;  Location: [place] [facility];  Service: Cardiovascular;  Laterality: N/A;  . LEFT HEART CATH AND CORONARY ANGIOGRAPHY N/A 06/20/2018   Procedure: LEFT HEART CATH AND CORONARY ANGIOGRAPHY;  Surgeon: Nelva Bush, MD;  Location: [place] [facility];  Service: Cardiovascular;  Laterality: N/A;  . PERIPHERAL VASCULAR CATHETERIZATION Left 05/23/2015   Procedure: Nolon Stalls;  Surgeon: Conrad Cecil, MD;  Location: [place] [facility];  Service: Cardiovascular;  Laterality: Left;  upper aRM  . PERIPHERAL VASCULAR CATHETERIZATION Left 05/23/2015   Procedure: Peripheral Vascular Balloon Angioplasty;  Surgeon: Conrad [place], MD;  Location: [place] [facility];  Service: Cardiovascular;  Laterality: Left;  av fistula  . TUBAL LIGATION  1993  . UPPER GI ENDOSCOPY N/A 07/19/2012   Procedure: UPPER GI ENDOSCOPY;  Surgeon: Madilyn Hook, DO;  [facility];  Service: General;  Laterality: N/A;     A IV Location/Drains/Wounds Patient Lines/Drains/Airways Status   Active Line/Drains/Airways    Name:   Placement date:   Placement time:   Site:   Days:   Peripheral IV 01/15/19 Right Hand   01/15/19    1442    Hand   less than 1   Peripheral IV 01/15/19 Right;Distal Forearm   01/15/19    --    Forearm   less than 1   Fistula / Graft Left Upper arm Arteriovenous fistula   01/03/15    1309    Upper arm   1473   Fistula / Graft Left Upper arm Arteriovenous fistula   --    --    Upper arm      Fistula / Graft Left Upper arm Arteriovenous fistula   06/18/18    0040    Upper arm   211   Sheath 06/20/18 Right Arterial;Femoral   06/20/18    1047    Arterial;Femoral   209          Intake/Output Last 24 hours No intake or output data in the 24 hours ending 01/15/19 2248  Labs/Imaging Results for orders placed or performed during the hospital encounter of 01/15/19 (from the past 48 hour(s))  I-Stat beta hCG blood, ED     Status: None   Collection Time: 01/15/19   2:47 PM  Result Value Ref Range   I-stat hCG, quantitative <5.0 <5 mIU/mL   Comment 3            Comment:   GEST. AGE      CONC.  (mIU/mL)   <=1 WEEK        5 - 50     2 WEEKS       50 - 500     3 WEEKS       100 - 10,000     4 WEEKS     1,000 - 30,000        FEMALE AND NON-PREGNANT FEMALE:     LESS THAN 5 mIU/mL   Basic metabolic panel     Status: Abnormal   Collection Time: 01/15/19  2:48 PM  Result Value Ref Range   Sodium 136 135 - 145 mmol/L   Potassium 4.4  3.5 - 5.1 mmol/L   Chloride 95 (L) 98 - 111 mmol/L   CO2 19 (L) 22 - 32 mmol/L   Glucose, Bld 246 (H) 70 - 99 mg/dL   BUN 69 (H) 6 - 20 mg/dL   Creatinine, Ser 15.95 (H) 0.44 - 1.00 mg/dL   Calcium 8.2 (L) 8.9 - 10.3 mg/dL   GFR calc non Af Amer 2 (L) >60 mL/min   GFR calc Af Amer 3 (L) >60 mL/min   Anion gap 22 (H) 5 - 15    Comment: Performed at [facility] [address], [place], [place] [address]  CBC     Status: Abnormal   Collection Time: 01/15/19  2:48 PM  Result Value Ref Range   WBC 10.8 (H) 4.0 - 10.5 K/uL   RBC 3.42 (L) 3.87 - 5.11 MIL/uL   Hemoglobin 11.5 (L) 12.0 - 15.0 g/dL   HCT 36.4 36.0 - 46.0 %   MCV 106.4 (H) 80.0 - 100.0 fL   MCH 33.6 26.0 - 34.0 pg   MCHC 31.6 30.0 - 36.0 g/dL   RDW 13.2 11.5 - 15.5 %   Platelets 351 150 - 400 K/uL   nRBC 0.0 0.0 - 0.2 %    Comment: Performed at [facility], [place] [address], [place], [place] [address]  Troponin I (High Sensitivity)     Status: Abnormal   Collection Time: 01/15/19  2:48 PM  Result Value Ref Range   Troponin I (High Sensitivity) 43 (H) <18 ng/L    Comment: (NOTE) Elevated high sensitivity troponin I (hsTnI) values and significant  changes across serial measurements may suggest ACS but many other  chronic and acute conditions are known to elevate hsTnI results.  Refer to the Links section for chest pain algorithms and additional  guidance. Performed at [facility], [place] [address], [place], [place] [address]   Hepatic  function panel     Status: Abnormal   Collection Time: 01/15/19  2:48 PM  Result Value Ref Range   Total Protein 7.1 6.5 - 8.1 g/dL   Albumin 3.5 3.5 - 5.0 g/dL   AST 15 15 - 41 U/L   ALT 17 0 - 44 U/L   Alkaline Phosphatase 98 38 - 126 U/L   Total Bilirubin 0.3 0.3 - 1.2 mg/dL   Bilirubin, Direct 0.1 0.0 - 0.2 mg/dL   Indirect Bilirubin 0.2 (L) 0.3 - 0.9 mg/dL    Comment: Performed at [facility] [address] [place], [place] [address]  Troponin I (High Sensitivity)     Status: Abnormal   Collection Time: 01/15/19  6:06 PM  Result Value Ref Range   Troponin I (High Sensitivity) 781 (HH) <18 ng/L    Comment: CRITICAL RESULT CALLED TO, READ BACK BY AND VERIFIED WITH: M.ROGGIERO,RN 01/15/2019 1912 DAVISB (NOTE) Elevated high sensitivity troponin I (hsTnI) values and significant  changes across serial measurements may suggest ACS but many other  chronic and acute conditions are known to elevate hsTnI results.  Refer to the Links section for chest pain algorithms and additional  guidance. Performed at [facility], [place] [address], [place],  [address]    DG Chest 2 View  Result Date: 01/15/2019 CLINICAL DATA:  Chest pain since 1 p.m., history end-stage renal disease on dialysis, hypertension, type II diabetes mellitus, coronary artery disease EXAM: CHEST - 2 VIEW COMPARISON:  06/18/2018 FINDINGS: Normal heart size, mediastinal contours, and pulmonary vascularity. Minimal  subsegmental atelectasis at lingula and RIGHT base. Lungs otherwise clear. No infiltrate, pleural effusion or pneumothorax. Bones demineralized. IMPRESSION: Minimal bibasilar atelectasis. Electronically Signed   By: Lavonia Dana M.D.   On: 01/15/2019 15:35    Pending Labs Unresulted Labs (From admission, onward)    Start     Ordered   01/16/19 0500  Heparin level (unfractionated)  Daily,   R     01/15/19 2045   01/16/19 0500  CBC  Daily,   R     01/15/19 2045   01/16/19 3474  Basic metabolic  panel  Tomorrow morning,   R     01/15/19 2130   01/16/19 0500  Lipid panel  Tomorrow morning,   R     01/15/19 2130   01/15/19 1917  SARS CORONAVIRUS 2 (TAT 6-24 HRS) Nasopharyngeal Nasopharyngeal Swab  (Tier 3 (TAT 6-24 hrs))  Once,   STAT    Question Answer Comment  Is this test for diagnosis or screening Screening   Symptomatic for COVID-19 as defined by CDC No   Hospitalized for COVID-19 No   Admitted to ICU for COVID-19 No   Previously tested for COVID-19 Yes   Resident in a congregate (group) care setting No   Employed in healthcare setting No   Pregnant No      01/15/19 1916          Vitals/Pain Today's Vitals   01/15/19 1531 01/15/19 1715 01/15/19 2000 01/15/19 2247  BP: 116/74 (!) 146/74  138/72  Pulse: 75   64  Resp: 16 15  13   Temp: 98.2 F (36.8 C)   98.4 F (36.9 C)  TempSrc: Oral   Oral  SpO2: 97%   98%  Weight:   125 kg   Height:   5' 5"  (1.651 m)   PainSc:    2     Isolation Precautions No active isolations  Medications Medications  sodium chloride flush (NS) 0.9 % injection 3 mL (3 mLs Intravenous Not Given 01/15/19 1635)  heparin ADULT infusion 100 units/mL (25000 units/221m sodium chloride 0.45%) (1,050 Units/hr Intravenous New Bag/Given 01/15/19 2135)  acetaminophen (TYLENOL) tablet 650 mg (has no administration in time range)  aspirin EC tablet 81 mg (81 mg Oral Given 01/15/19 2243)  nitroGLYCERIN (NITROSTAT) SL tablet 0.4 mg (has no administration in time range)  rosuvastatin (CRESTOR) tablet 10 mg (10 mg Oral Refused 01/15/19 2136)  cinacalcet (SENSIPAR) tablet 90 mg (has no administration in time range)  ferric citrate (AURYXIA) tablet 210 mg (has no administration in time range)  calcium acetate (PHOSLO) capsule 667 mg (has no administration in time range)  folic acid (FOLVITE) tablet 1 mg (1 mg Oral Given 01/15/19 2147)  gabapentin (NEURONTIN) capsule 100 mg (100 mg Oral Given 01/15/19 2147)  gentamicin cream (GARAMYCIN) 0.1 % 1  application (has no administration in time range)  nitroGLYCERIN (NITROSTAT) SL tablet 0.4 mg (has no administration in time range)  acetaminophen (TYLENOL) tablet 650 mg (has no administration in time range)  ondansetron (ZOFRAN) injection 4 mg (has no administration in time range)  nitroGLYCERIN (NITROGLYN) 2 % ointment 0.5 inch (has no administration in time range)  metoprolol tartrate (LOPRESSOR) tablet 12.5 mg (12.5 mg Oral Given 01/15/19 2147)  fentaNYL (SUBLIMAZE) injection 25 mcg (has no administration in time range)  fentaNYL (SUBLIMAZE) injection 50 mcg (50 mcg Intravenous Given 01/15/19 1758)  nitroGLYCERIN (NITROSTAT) SL tablet 0.4 mg (0.4 mg Sublingual Given 01/15/19 1937)  heparin bolus via infusion 4,000 Units (4,000  Units Intravenous Bolus from Bag 01/15/19 2135)    Mobility walks Low fall risk   Focused Assessments Cardiac Assessment Handoff:  Cardiac Rhythm: Normal sinus rhythm Lab Results  Component Value Date   CKTOTAL 146 12/31/2014   CKMB 3.1 12/31/2014   TROPONINI 0.99 (HH) 06/19/2018   Lab Results  Component Value Date   DDIMER 1.96 (H) 12/31/2014   Does the Patient currently have chest pain? Yes , pain of 2/10    R Recommendations: See Admitting Provider Note  Report given to:   Additional Notes:

## 2019-01-15 NOTE — ED Notes (Signed)
[place] sandwich and sprite provided to pt

## 2019-01-15 NOTE — ED Notes (Signed)
This RN attempted report, floor RN unavailable. Will try back in 18mn.

## 2019-01-15 NOTE — ED Notes (Signed)
This RN attempted report again, was unable to reach RN. Will try again in 53mn.

## 2019-01-15 NOTE — H&P (Signed)
History and Physical    Brenda Davis VVZ:[id number] DOB: 08-13-1965 DOA: 01/15/2019  PCP: [name], MD  Patient coming from: Home  Chief Complaint: Chest pain  HPI: Brenda Davis is a 53 y.o. female with medical history significant of CAD, DM2, HTN, HLD, Gout, ESRD on PD who presents for chest pain.  Patient reports that for the last 2 weeks she has been having issues with fatigue.  Thurs and Friday she also had decreased exercise tolerance and chest pain that resolved with nitroglycerin.  She seemed to get better on Saturday, however, her symptoms returned on Sunday.  She notes that she finished her PD at about 930am.  She felt more fatigued than usual and was not hungry at that time.  She continued to not feel herself and then at 11am she developed substernal and left sided chest pain which felt like a stabbing pain.  Associated nausea, SOB and diaphoresis.  She took NTG X 1 and the pain worsened.  She took NTG a second time and the pain worsened to 10/10, no radiation.  Her husband called 40 and she was administered baby aspirin X 4 and a 3rd dose of NTG and the pain eased off.  It also improved with fentanyl in the ED.  She now has pain at about 3/10 and "nagging" in nature.     Her Last LHC was on 06/20/2018.  She was not able to tolerated the isosorbide, she felt that it made her chest pain worse and she also developed diaphoresis with it.    Conclusions: 1. Moderately severe multivessel coronary artery disease not significantly changed since prior catheterization in 02/2018.  DFR of the most severe lesion in the RCA at that time was not hemodynamically significant. 2. Normal left ventricular filling pressure. 3. Hyperdynamic left ventricular contraction.  Recommendations: 1. Medical therapy for moderate diffuse coronary artery disease.  There is no clear culprit for PCI.  I suspect troponin elevation reflects supply-demand mismatch.  I will start isosorbide  mononitrate 30 mg daily, which can be escalated as blood pressure tolerates for relief of chest pain. 2. Aggressive secondary prevention, including high-intensity statin therapy. 3. If no evidence of bleeding complications and no recurrence of chest pain, discharge this afternoon or tomorrow could be considered from a cardiac standpoint.  She further reports that she did get started on gabapentin which seemed to ease off the chest pain and give her a period of relief.  She was started back on Rosuvastatin on 12/12/2018.  However, she has not taken this in the last 3 days because she feels that starting this coincided with her chest pain starting back.   ED Course: In the ED, she was treated with fentanyl and an EKG and troponin were ordered.  Her second high sensitivity troponin came back at > 700.  Cardiology was consulted.   Review of Systems: As per HPI otherwise 10 point review of systems negative.    Past Medical History:  Diagnosis Date  . Anemia   . CAD (coronary artery disease)    a. cath in 02/2018 showing moderate 3-vessel CAD with 45% mid-LADm 55% LCx and 65% RCA stenosis which was not significant by FFR  . ESRD on dialysis ([place])   . Gout   . HCAP (healthcare-associated pneumonia) 05/06/2016  . History of blood transfusion    "related to surgery"  . Hyperlipidemia   . Hypertension   . Morbid obesity ([place])   . Pain    LEFT  SHOULDER PAIN - WAS SEEN AT AN URGENT CARE - GIVEN SLING FOR COMFORT AND TOLD ROM AS TOLERATED.  Marland Kitchen Palpitations 09/24/2016  . Peritonitis, dialysis-associated ([place])   . Type II diabetes mellitus ([place])    "gastric sleeve OR corrected this" (05/06/2016)    Past Surgical History:  Procedure Laterality Date  . AV FISTULA PLACEMENT Left 01/03/2015   Procedure: BRACHIAL CEPHALIC ARTERIOVENOUS  FISTULA CREATION LEFT ARM;  Surgeon: Angelia Mould, MD;  Location: [place] [facility];  Service: Vascular;  Laterality: Left;  . CARPAL TUNNEL RELEASE Right   . [place]  . CHOLECYSTECTOMY  09/14/2017  . ESOPHAGOGASTRODUODENOSCOPY N/A 09/04/2012   Procedure: ESOPHAGOGASTRODUODENOSCOPY (EGD);  Surgeon: Shann Medal, MD;  Location: [facility];  Service: General;  Laterality: N/A;  PF  . ESOPHAGOGASTRODUODENOSCOPY (EGD) WITH ESOPHAGEAL DILATION N/A 09/29/2012   Procedure: ESOPHAGOGASTRODUODENOSCOPY (EGD) WITH ESOPHAGEAL DILATION;  Surgeon: Milus Banister, MD;  Location: [facility];  Service: Endoscopy;  Laterality: N/A;  . INSERTION OF DIALYSIS CATHETER N/A 01/03/2015   Procedure: INSERTION OF DIALYSIS CATHETER RIGHT INTERNAL JUGULAR;  Surgeon: Angelia Mould, MD;  Location: [place];  Service: Vascular;  Laterality: N/A;  . LAPAROSCOPIC GASTRIC SLEEVE RESECTION N/A 07/19/2012   Procedure: LAPAROSCOPIC SLEEVE GASTRECTOMY with EGD;  Surgeon: Madilyn Hook, DO;  Location: [facility];  Service: General;  Laterality: N/A;  laparoscopic sleeve gastrectomy with EGD  . LEFT HEART CATH AND CORONARY ANGIOGRAPHY N/A 02/25/2018   Procedure: LEFT HEART CATH AND CORONARY ANGIOGRAPHY;  Surgeon: Leonie Man, MD;  Location: [facility];  Service: Cardiovascular;  Laterality: N/A;  . LEFT HEART CATH AND CORONARY ANGIOGRAPHY N/A 06/20/2018   Procedure: LEFT HEART CATH AND CORONARY ANGIOGRAPHY;  Surgeon: Nelva Bush, MD;  Location: [facility] [facility];  Service: Cardiovascular;  Laterality: N/A;  . PERIPHERAL VASCULAR CATHETERIZATION Left 05/23/2015   Procedure: Nolon Stalls;  Surgeon: Conrad Trafford, MD;  Location: [facility];  Service: Cardiovascular;  Laterality: Left;  upper aRM  . PERIPHERAL VASCULAR CATHETERIZATION Left 05/23/2015   Procedure: Peripheral Vascular Balloon Angioplasty;  Surgeon: Conrad Edna, MD;  Location: [facility];  Service: Cardiovascular;  Laterality: Left;  av fistula  . TUBAL LIGATION  1993  . UPPER GI ENDOSCOPY N/A 07/19/2012   Procedure: UPPER GI ENDOSCOPY;  Surgeon: Madilyn Hook, DO;  Location: [facility];  Service:  General;  Laterality: N/A;   Reviewed with patient.   reports that she quit smoking about 11 years ago. Her smoking use included cigarettes. She has a 16.00 pack-year smoking history. She has never used smokeless tobacco. She reports that she does not drink alcohol or use drugs.  Allergies  Allergen Reactions  . Amlodipine Swelling  . Atorvastatin     Elevated LFT's  . Clonidine Derivatives Swelling    Limbs swell  . Doxycycline Nausea And Vomiting    "I threw up for 3 hours"  . Welchol [Colesevelam Hcl] Nausea Only   Reviewed with patient, no new history since last admission.  Family History  Problem Relation Age of Onset  . Hypertension Mother   . Mitral valve prolapse Mother   . Diabetes Father   . Arthritis Other   . Hyperlipidemia Other   . Cancer Neg Hx   . Heart disease Neg Hx   . Kidney disease Neg Hx   . Stroke Neg Hx     Prior to Admission medications   Medication Sig Start Date End Date Taking?  Authorizing Provider  acetaminophen (TYLENOL) 325 MG tablet Take 650 mg by mouth every 6 (six) hours as needed for mild pain.    [provider]  aspirin EC 81 MG EC tablet Take 1 tablet (81 mg total) by mouth daily. 02/26/18   Norval Morton, MD  AURYXIA 1 GM 210 MG(Fe) tablet Take 210 mg by mouth 3 (three) times daily with meals.  04/07/17   [provider]  calcium acetate (PHOSLO) 667 MG capsule Take 667 mg by mouth 3 (three) times daily with meals. 06/02/18   [provider]  cinacalcet (SENSIPAR) 30 MG tablet Take 1 tablet (30 mg total) by mouth daily with supper. 04/28/18   [name], MD  Cyanocobalamin (B-12 PO) Take 1 tablet by mouth daily.    [provider]  folic acid (FOLVITE) 1 MG tablet Take 1 tablet (1 mg total) by mouth daily. 03/08/18   [name], MD  gabapentin (NEURONTIN) 100 MG capsule Take 100 mg by mouth 2 (two) times daily. 10/31/18   [provider]  gentamicin cream (GARAMYCIN) 0.1 % Apply 1  application topically See admin instructions. Apply to exit site daily as directed. 04/26/17   [provider]  nitroGLYCERIN (NITROSTAT) 0.4 MG SL tablet PLACE 1 TABLET (0.4 MG TOTAL) UNDER THE TONGUE EVERY 5 (FIVE) MINUTES AS NEEDED FOR CHEST PAIN. 08/01/18   Skeet Latch, MD  ondansetron (ZOFRAN ODT) 8 MG disintegrating tablet Take 1 tablet (8 mg total) by mouth every 8 (eight) hours as needed for nausea or vomiting. 04/09/18   Jola Schmidt, MD  rosuvastatin (CRESTOR) 10 MG tablet Take 1 tablet (10 mg total) by mouth daily. 12/12/18 03/12/19  Skeet Latch, MD    Physical Exam: Constitutional: NAD, calm, comfortable Vitals:   01/15/19 1531 01/15/19 1715 01/15/19 2000  BP: 116/74 (!) 146/74   Pulse: 75    Resp: 16 15   Temp: 98.2 F (36.8 C)    TempSrc: Oral    SpO2: 97%    Weight:   125 kg  Height:   5' 5"  (1.651 m)   Eyes: PERRL, lids and conjunctivae normal ENMT: Mucous membranes are moist. Posterior pharynx clear of any exudate or lesions.Normal dentition.  Neck: normal, supple, no masses, no thyromegaly Respiratory: clear to auscultation bilaterally, no wheezing, no crackles. Normal respiratory effort. No accessory muscle use.  Cardiovascular: Regular rate and rhythm, no murmurs / rubs / gallops. No extremity edema. 2+ pedal pulses. No carotid bruits.  Abdomen: no tenderness, no masses palpated. No hepatosplenomegaly. Bowel sounds positive.  Musculoskeletal: no clubbing / cyanosis. No joint deformity upper and lower extremities. Good ROM, no contractures. Normal muscle tone.  Skin: no rashes, lesions, ulcers. No induration Neurologic: CN 2-12 grossly intact. Sensation intact, DTR normal. Strength 5/5 in all 4.  Psychiatric: Normal judgment and insight. Alert and oriented x 3. Normal mood.   (Anything < 9 systems with 2 bullets each down codes to level 1) (If patient refuses exam can't bill higher level) (Make sure to document decubitus ulcers present on admission  -- if possible -- and whether patient has chronic indwelling catheter at time of admission)  Labs on Admission: I have personally reviewed following labs and imaging studies  CBC: Recent Labs  Lab 01/15/19 1448  WBC 10.8*  HGB 11.5*  HCT 36.4  MCV 106.4*  PLT 161   Basic Metabolic Panel: Recent Labs  Lab 01/15/19 1448  NA 136  K 4.4  CL 95*  CO2 19*  GLUCOSE 246*  BUN 69*  CREATININE 15.95*  CALCIUM 8.2*   GFR: Estimated Creatinine Clearance: 5.4 mL/min (A) (by C-G formula based on SCr of 15.95 mg/dL (H)). Liver Function Tests: No results for input(s): AST, ALT, ALKPHOS, BILITOT, PROT, ALBUMIN in the last 168 hours. No results for input(s): LIPASE, AMYLASE in the last 168 hours. No results for input(s): AMMONIA in the last 168 hours. Coagulation Profile: No results for input(s): INR, PROTIME in the last 168 hours. Cardiac Enzymes: No results for input(s): CKTOTAL, CKMB, CKMBINDEX, TROPONINI in the last 168 hours. BNP (last 3 results) No results for input(s): PROBNP in the last 8760 hours. HbA1C: No results for input(s): HGBA1C in the last 72 hours. CBG: No results for input(s): GLUCAP in the last 168 hours. Lipid Profile: No results for input(s): CHOL, HDL, LDLCALC, TRIG, CHOLHDL, LDLDIRECT in the last 72 hours. Thyroid Function Tests: No results for input(s): TSH, T4TOTAL, FREET4, T3FREE, THYROIDAB in the last 72 hours. Anemia Panel: No results for input(s): VITAMINB12, FOLATE, FERRITIN, TIBC, IRON, RETICCTPCT in the last 72 hours. Urine analysis:    Component Value Date/Time   COLORURINE COLORLESS (A) 04/18/2018 1900   APPEARANCEUR CLEAR 04/18/2018 1900   LABSPEC 1.005 04/18/2018 1900   PHURINE 8.0 04/18/2018 1900   GLUCOSEU >=500 (A) 04/18/2018 1900   GLUCOSEU >=1000 (A) 10/28/2017 0839   HGBUR SMALL (A) 04/18/2018 1900   HGBUR negative 09/17/2009 0000   BILIRUBINUR negative 11/23/2018 1125   KETONESUR NEGATIVE 04/18/2018 1900   PROTEINUR Positive (A)  11/23/2018 1125   PROTEINUR 30 (A) 04/18/2018 1900   UROBILINOGEN 0.2 11/23/2018 1125   UROBILINOGEN 0.2 10/28/2017 0839   NITRITE negative 11/23/2018 1125   NITRITE NEGATIVE 04/18/2018 1900   LEUKOCYTESUR Moderate (2+) (A) 11/23/2018 1125   LEUKOCYTESUR NEGATIVE 04/18/2018 1900    Radiological Exams on Admission: DG Chest 2 View  Result Date: 01/15/2019 CLINICAL DATA:  Chest pain since 1 p.m., history end-stage renal disease on dialysis, hypertension, type II diabetes mellitus, coronary artery disease EXAM: CHEST - 2 VIEW COMPARISON:  06/18/2018 FINDINGS: Normal heart size, mediastinal contours, and pulmonary vascularity. Minimal subsegmental atelectasis at lingula and RIGHT base. Lungs otherwise clear. No infiltrate, pleural effusion or pneumothorax. Bones demineralized. IMPRESSION: Minimal bibasilar atelectasis. Electronically Signed   By: Lavonia Dana M.D.   On: 01/15/2019 15:35    EKG: Independently reviewed. Similar to previous, no new ST elevations or TWI.  Prolonged QTc  Assessment/Plan NSTEMI (non-ST elevated myocardial infarction) ([place]) CAD in native artery Chronic HFpEF - She will be admitted for further work up - Nitroglycerin paste and fentanyl for continued pain - Cardiology is consulted - Heparin gtt per pharmacy consult, monitor for bleeding - She could not tolerate IMDUR, continue gabapentin - Restart rosuvastatin 75m daily, check lipid panel - Telemetry - Start beta blocker - Metoprolol 12.529mBID (hold for HR 50 or less) - Given results of last LHC, I doubt this will be repeated, but I did make her NPO at midnight in case indicated - Repeat EKG in the AM and PRN for chest pain    Dyslipidemia, goal LDL below 70 - She has not been able to tolerated statins.  Atorvastatin gave her elevated LFTs, add on hepatic panel to BMET from today - Restart rosuvastatin - Lipid panel    Morbid obesity S/P laparoscopic sleeve gastrectomy - No acute issues, monitor for  vitamin deficiencies.  She is on B12, thiamine at home    ESRD on peritoneal  dialysis - Consulted nephrology to arrange for inpatient treatment.  K is 4.4, she is mildly acidotic.  She has normal mentation and BUN appears to be around baseline.  - Daily electrolyte monitoring    DM (diabetes mellitus), type 2 with renal complications on no therapy - Check CBG daily, consider A1C if needed    Hepatic cirrhosis  - Seen on imaging (CT abdomen and US abdomen) - Volume is managed with dialysis - Avoid medications that may cause liver toxicity     DVT prophylaxis: Full dose heparin  Code Status: Full  Disposition Plan: Admit for heparin, Cardiology consult Consults called: Cardiology, Nephrology Joelyn Oms)  Admission status: Inpatient, Progressive (given active chest pain)    Gilles Chiquito MD [facility]   If 7PM-7AM, please contact night-coverage [email or web address] Password [facility]  01/15/2019, 9:24 PM

## 2019-01-15 NOTE — ED Triage Notes (Addendum)
Pt to triage via [facility] from home.  Pt c/o chest pain since 1pm.  Took 4 baby ASA and NTG x 2 prior to EMS arrival.  Vomited x 1.  EMS administered NTG x 1 and Morphine 40m IV.  Pain relieved at this time.   Peritoneal dialysis yesterday.

## 2019-01-15 NOTE — Progress Notes (Signed)
ANTICOAGULATION CONSULT NOTE - Initial Consult  Pharmacy Consult for Heparin  Indication: chest pain/ACS  Allergies  Allergen Reactions  . Amlodipine Swelling  . Atorvastatin     Elevated LFT's  . Clonidine Derivatives Swelling    Limbs swell  . Doxycycline Nausea And Vomiting    "I threw up for 3 hours"  . Welchol [Colesevelam Hcl] Nausea Only    Patient Measurements: Height: 5' 5"  (165.1 cm) Weight: 275 lb 9.2 oz (125 kg) IBW/kg (Calculated) : 57 Heparin Dosing Weight: 87.4 kg  Vital Signs: Temp: 98.2 F (36.8 C) (12/27 1531) Temp Source: Oral (12/27 1531) BP: 146/74 (12/27 1715) Pulse Rate: 75 (12/27 1531)  Labs: Recent Labs    01/15/19 1448 01/15/19 1806  HGB 11.5*  --   HCT 36.4  --   PLT 351  --   CREATININE 15.95*  --   TROPONINIHS 43* 781*    Estimated Creatinine Clearance: 5.4 mL/min (A) (by C-G formula based on SCr of 15.95 mg/dL (H)).   Medical History: Past Medical History:  Diagnosis Date  . Anemia   . CAD (coronary artery disease)    a. cath in 02/2018 showing moderate 3-vessel CAD with 45% mid-LADm 55% LCx and 65% RCA stenosis which was not significant by FFR  . ESRD on dialysis ([place])   . Gout   . HCAP (healthcare-associated pneumonia) 05/06/2016  . History of blood transfusion    "related to surgery"  . Hyperlipidemia   . Hypertension   . Morbid obesity ([place])   . Pain    LEFT SHOULDER PAIN - WAS SEEN AT AN URGENT CARE - GIVEN SLING FOR COMFORT AND TOLD ROM AS TOLERATED.  Marland Kitchen Palpitations 09/24/2016  . Peritonitis, dialysis-associated ([place])   . Type II diabetes mellitus ([place])    "gastric sleeve OR corrected this" (05/06/2016)    Medications:  Scheduled:  . heparin  4,000 Units Intravenous Once  . sodium chloride flush  3 mL Intravenous Once    Assessment: Patient is a 40 yof that presented to the ED with c/o chest pain. The patient had a slightly elevated trop and repeat was significantly increased. Pharmacy was asked to start a  heparin drip on this patient.   Goal of Therapy:  Heparin level 0.3-0.7 units/ml Monitor platelets by anticoagulation protocol: Yes   Plan:  - Heparin bolus of 4000 units  - Followed by Heparin drip @ 1050 units/hr  - Heparin level in 8 hours (AM labs) - Monitor patient for s/s of bleeding and CBC daily   Duanne Limerick PharmD. BCPS  01/15/2019,8:46 PM

## 2019-01-16 ENCOUNTER — Inpatient Hospital Stay (HOSPITAL_COMMUNITY): Payer: Medicare Other

## 2019-01-16 ENCOUNTER — Encounter (HOSPITAL_COMMUNITY)
Admission: EM | Disposition: A | Discharge status: Home Health | Payer: Self-pay | Source: Home / Self Care | Attending: Pulmonary Disease

## 2019-01-16 DIAGNOSIS — E785 Hyperlipidemia, unspecified: Secondary | ICD-10-CM

## 2019-01-16 DIAGNOSIS — Z992 Dependence on renal dialysis: Secondary | ICD-10-CM

## 2019-01-16 DIAGNOSIS — R778 Other specified abnormalities of plasma proteins: Secondary | ICD-10-CM

## 2019-01-16 DIAGNOSIS — I25110 Atherosclerotic heart disease of native coronary artery with unstable angina pectoris: Secondary | ICD-10-CM

## 2019-01-16 DIAGNOSIS — I2510 Atherosclerotic heart disease of native coronary artery without angina pectoris: Secondary | ICD-10-CM

## 2019-01-16 DIAGNOSIS — N186 End stage renal disease: Secondary | ICD-10-CM

## 2019-01-16 DIAGNOSIS — I214 Non-ST elevation (NSTEMI) myocardial infarction: Secondary | ICD-10-CM

## 2019-01-16 HISTORY — PX: LEFT HEART CATH AND CORONARY ANGIOGRAPHY: CATH118249

## 2019-01-16 LAB — CBC
HCT: 37.6 % (ref 36.0–46.0)
Hemoglobin: 12.3 g/dL (ref 12.0–15.0)
MCH: 33.8 pg (ref 26.0–34.0)
MCHC: 32.7 g/dL (ref 30.0–36.0)
MCV: 103.3 fL — ABNORMAL HIGH (ref 80.0–100.0)
Platelets: 335 10*3/uL (ref 150–400)
RBC: 3.64 MIL/uL — ABNORMAL LOW (ref 3.87–5.11)
RDW: 12.9 % (ref 11.5–15.5)
WBC: 10.2 10*3/uL (ref 4.0–10.5)
nRBC: 0 % (ref 0.0–0.2)

## 2019-01-16 LAB — BASIC METABOLIC PANEL
Anion gap: 23 — ABNORMAL HIGH (ref 5–15)
BUN: 77 mg/dL — ABNORMAL HIGH (ref 6–20)
CO2: 22 mmol/L (ref 22–32)
Calcium: 8.2 mg/dL — ABNORMAL LOW (ref 8.9–10.3)
Chloride: 94 mmol/L — ABNORMAL LOW (ref 98–111)
Creatinine, Ser: 17.25 mg/dL — ABNORMAL HIGH (ref 0.44–1.00)
GFR calc Af Amer: 2 mL/min — ABNORMAL LOW (ref >60)
GFR calc non Af Amer: 2 mL/min — ABNORMAL LOW (ref >60)
Glucose, Bld: 126 mg/dL — ABNORMAL HIGH (ref 70–99)
Potassium: 5.4 mmol/L — ABNORMAL HIGH (ref 3.5–5.1)
Sodium: 139 mmol/L (ref 135–145)

## 2019-01-16 LAB — ECHOCARDIOGRAM COMPLETE
Height: 65 in
Weight: 4632 oz

## 2019-01-16 LAB — LIPID PANEL
Cholesterol: 185 mg/dL (ref 0–200)
HDL: 39 mg/dL — ABNORMAL LOW (ref >40)
LDL Cholesterol: 116 mg/dL — ABNORMAL HIGH (ref 0–99)
Total CHOL/HDL Ratio: 4.7 RATIO
Triglycerides: 150 mg/dL — ABNORMAL HIGH (ref <150)
VLDL: 30 mg/dL (ref 0–40)

## 2019-01-16 LAB — SARS CORONAVIRUS 2 (TAT 6-24 HRS): SARS Coronavirus 2: NEGATIVE

## 2019-01-16 LAB — HEPARIN LEVEL (UNFRACTIONATED)
Heparin Unfractionated: 0.38 IU/mL (ref 0.30–0.70)
Heparin Unfractionated: 0.34 IU/mL (ref 0.30–0.70)

## 2019-01-16 LAB — HEMOGLOBIN A1C
Hgb A1c MFr Bld: 8 % — ABNORMAL HIGH (ref 4.8–5.6)
Mean Plasma Glucose: 182.9 mg/dL

## 2019-01-16 LAB — GLUCOSE, CAPILLARY
Glucose-Capillary: 217 mg/dL — ABNORMAL HIGH (ref 70–99)
Glucose-Capillary: 180 mg/dL — ABNORMAL HIGH (ref 70–99)

## 2019-01-16 SURGERY — LEFT HEART CATH AND CORONARY ANGIOGRAPHY
Anesthesia: LOCAL

## 2019-01-16 MED ORDER — INSULIN ASPART 100 UNIT/ML ~~LOC~~ SOLN
0.0000 [IU] | Freq: Every day | SUBCUTANEOUS | Status: DC
  Administered 2019-01-17: 3 [IU] via SUBCUTANEOUS
  Administered 2019-01-18: 4 [IU] via SUBCUTANEOUS

## 2019-01-16 MED ORDER — GENTAMICIN SULFATE 0.1 % EX CREA
1.0000 "application " | TOPICAL_CREAM | Freq: Every day | CUTANEOUS | Status: DC
  Administered 2019-01-16: 1 via TOPICAL
  Filled 2019-01-16: qty 15

## 2019-01-16 MED ORDER — ONDANSETRON HCL 4 MG/2ML IJ SOLN: 4.0000 mg | Freq: Four times a day (QID) | INTRAMUSCULAR | Status: DC | PRN

## 2019-01-16 MED ORDER — ACETAMINOPHEN 325 MG PO TABS: 650.0000 mg | ORAL_TABLET | ORAL | Status: DC | PRN

## 2019-01-16 MED ORDER — HEPARIN 1000 UNIT/ML FOR PERITONEAL DIALYSIS: 500.0000 [IU] | INTRAMUSCULAR | Status: DC | PRN

## 2019-01-16 MED ORDER — SODIUM CHLORIDE 0.9% FLUSH: 3.0000 mL | INTRAVENOUS | Status: DC | PRN

## 2019-01-16 MED ORDER — HEPARIN (PORCINE) IN NACL 1000-0.9 UT/500ML-% IV SOLN
INTRAVENOUS | Status: DC | PRN
  Administered 2019-01-16: 500 mL

## 2019-01-16 MED ORDER — FENTANYL CITRATE (PF) 100 MCG/2ML IJ SOLN
INTRAMUSCULAR | Status: AC
  Filled 2019-01-16: qty 2

## 2019-01-16 MED ORDER — HEPARIN (PORCINE) 25000 UT/250ML-% IV SOLN
1250.0000 [IU]/h | INTRAVENOUS | Status: DC
  Administered 2019-01-17: 08:00:00 1250 [IU]/h via INTRAVENOUS
  Filled 2019-01-16: qty 250

## 2019-01-16 MED ORDER — DELFLEX-LC/2.5% DEXTROSE 394 MOSM/L IP SOLN: INTRAPERITONEAL | Status: DC

## 2019-01-16 MED ORDER — SODIUM CHLORIDE 0.9 % IV SOLN: INTRAVENOUS | Status: DC

## 2019-01-16 MED ORDER — SODIUM CHLORIDE 0.9 % IV SOLN
250.0000 mL | INTRAVENOUS | Status: DC | PRN
  Administered 2019-01-19: 01:00:00 250 mL via INTRAVENOUS

## 2019-01-16 MED ORDER — MIDAZOLAM HCL 2 MG/2ML IJ SOLN
INTRAMUSCULAR | Status: DC | PRN
  Administered 2019-01-16: 2 mg via INTRAVENOUS

## 2019-01-16 MED ORDER — SODIUM CHLORIDE 0.9 % IV SOLN: 250.0000 mL | INTRAVENOUS | Status: DC | PRN

## 2019-01-16 MED ORDER — HEPARIN 1000 UNIT/ML FOR PERITONEAL DIALYSIS
INTRAPERITONEAL | Status: DC | PRN
  Filled 2019-01-16: qty 5000

## 2019-01-16 MED ORDER — SODIUM CHLORIDE 0.9% FLUSH: 3.0000 mL | Freq: Two times a day (BID) | INTRAVENOUS | Status: DC

## 2019-01-16 MED ORDER — HEPARIN (PORCINE) IN NACL 1000-0.9 UT/500ML-% IV SOLN
INTRAVENOUS | Status: AC
  Filled 2019-01-16: qty 1500

## 2019-01-16 MED ORDER — DELFLEX-LC/1.5% DEXTROSE 344 MOSM/L IP SOLN: INTRAPERITONEAL | Status: DC

## 2019-01-16 MED ORDER — PERFLUTREN LIPID MICROSPHERE
1.0000 mL | INTRAVENOUS | Status: AC | PRN
  Administered 2019-01-16: 2 mL via INTRAVENOUS
  Filled 2019-01-16: qty 10

## 2019-01-16 MED ORDER — MIDAZOLAM HCL 2 MG/2ML IJ SOLN
INTRAMUSCULAR | Status: AC
  Filled 2019-01-16: qty 2

## 2019-01-16 MED ORDER — SODIUM CHLORIDE 0.9% FLUSH
3.0000 mL | Freq: Two times a day (BID) | INTRAVENOUS | Status: DC
  Administered 2019-01-16 – 2019-01-23 (×10): 3 mL via INTRAVENOUS

## 2019-01-16 MED ORDER — LABETALOL HCL 5 MG/ML IV SOLN: 10.0000 mg | INTRAVENOUS | Status: AC | PRN

## 2019-01-16 MED ORDER — FENTANYL CITRATE (PF) 100 MCG/2ML IJ SOLN
INTRAMUSCULAR | Status: DC | PRN
  Administered 2019-01-16: 25 ug via INTRAVENOUS

## 2019-01-16 MED ORDER — HEPARIN (PORCINE) IN NACL 1000-0.9 UT/500ML-% IV SOLN
INTRAVENOUS | Status: AC
  Filled 2019-01-16: qty 1000

## 2019-01-16 MED ORDER — INSULIN ASPART 100 UNIT/ML ~~LOC~~ SOLN
0.0000 [IU] | Freq: Three times a day (TID) | SUBCUTANEOUS | Status: DC
  Administered 2019-01-16: 3 [IU] via SUBCUTANEOUS
  Administered 2019-01-17: 2 [IU] via SUBCUTANEOUS
  Administered 2019-01-18: 7 [IU] via SUBCUTANEOUS
  Administered 2019-01-18: 9 [IU] via SUBCUTANEOUS
  Administered 2019-01-18: 5 [IU] via SUBCUTANEOUS
  Administered 2019-01-19: 3 [IU] via SUBCUTANEOUS
  Administered 2019-01-19: 17:00:00 1 [IU] via SUBCUTANEOUS
  Administered 2019-01-19: 07:00:00 3 [IU] via SUBCUTANEOUS
  Administered 2019-01-20: 17:00:00 1 [IU] via SUBCUTANEOUS
  Administered 2019-01-20: 07:00:00 3 [IU] via SUBCUTANEOUS
  Administered 2019-01-20 – 2019-01-21 (×2): 1 [IU] via SUBCUTANEOUS
  Administered 2019-01-22 – 2019-01-23 (×2): 2 [IU] via SUBCUTANEOUS

## 2019-01-16 MED ORDER — HYDRALAZINE HCL 20 MG/ML IJ SOLN: 10.0000 mg | INTRAMUSCULAR | Status: AC | PRN

## 2019-01-16 MED ORDER — LIDOCAINE HCL (PF) 1 % IJ SOLN
INTRAMUSCULAR | Status: AC
  Filled 2019-01-16: qty 30

## 2019-01-16 MED ORDER — LIDOCAINE HCL (PF) 1 % IJ SOLN
INTRAMUSCULAR | Status: DC | PRN
  Administered 2019-01-16: 18 mL

## 2019-01-16 MED ORDER — IOHEXOL 350 MG/ML SOLN
INTRAVENOUS | Status: DC | PRN
  Administered 2019-01-16: 65 mL via INTRA_ARTERIAL

## 2019-01-16 SURGICAL SUPPLY — 9 items
CATH DXT MULTI JL4 JR4 ANG PIG (CATHETERS) ×1 IMPLANT
CLOSURE MYNX CONTROL 5F (Vascular Products) ×1 IMPLANT
KIT HEART LEFT (KITS) ×2 IMPLANT
PACK CARDIAC CATHETERIZATION (CUSTOM PROCEDURE TRAY) ×2 IMPLANT
SHEATH PINNACLE 5F 10CM (SHEATH) ×2 IMPLANT
SHEATH PROBE COVER 6X72 (BAG) ×2 IMPLANT
TRANSDUCER W/STOPCOCK (MISCELLANEOUS) ×2 IMPLANT
TUBING CIL FLEX 10 FLL-RA (TUBING) ×2 IMPLANT
WIRE EMERALD 3MM-J .035X150CM (WIRE) ×1 IMPLANT

## 2019-01-16 NOTE — Plan of Care (Signed)
  Problem: Education: Goal: Knowledge of General Education information will improve Description: Including pain rating scale, medication(s)/side effects and non-pharmacologic comfort measures Outcome: Progressing   Problem: Coping: Goal: Level of anxiety will decrease Outcome: Progressing   Problem: Pain Managment: Goal: General experience of comfort will improve Outcome: Progressing   

## 2019-01-16 NOTE — Progress Notes (Signed)
ANTICOAGULATION CONSULT NOTE - Follow Up Consult  Pharmacy Consult for heparin Indication: chest pain/ACS  Labs: Recent Labs    01/15/19 1448 01/15/19 1806 01/16/19 0449  HGB 11.5*  --  12.3  HCT 36.4  --  37.6  PLT 351  --  335  HEPARINUNFRC  --   --  0.38  CREATININE 15.95*  --  17.25*  TROPONINIHS 43* 781*  --     Assessment/Plan:  53yo female therapeutic on heparin with initial dosing for CP. Will continue gtt at current rate and confirm stable with additional level.   Wynona Neat, PharmD, BCPS  01/16/2019,5:56 AM

## 2019-01-16 NOTE — H&P (View-Only) (Signed)
Progress Note  Patient Name: Brenda Davis Date of Encounter: 01/16/2019  Primary Cardiologist: Skeet Latch, MD   Subjective   No chest pain this morning.   Inpatient Medications    Scheduled Meds: . aspirin EC  81 mg Oral Daily  . calcium acetate  667 mg Oral TID WC  . cinacalcet  90 mg Oral Q supper  . ferric citrate  210 mg Oral TID WC  . folic acid  1 mg Oral Daily  . gabapentin  100 mg Oral BID  . metoprolol tartrate  12.5 mg Oral BID  . nitroGLYCERIN  0.5 inch Topical Q6H  . rosuvastatin  10 mg Oral Daily  . sodium chloride flush  3 mL Intravenous Once   Continuous Infusions: . heparin 1,050 Units/hr (01/15/19 2135)   PRN Meds: acetaminophen, fentaNYL (SUBLIMAZE) injection, gentamicin cream, nitroGLYCERIN, ondansetron (ZOFRAN) IV   Vital Signs    Vitals:   01/15/19 2247 01/15/19 2346 01/15/19 2348 01/16/19 0616  BP: 138/72  (!) 119/99 116/73  Pulse: 64  69 72  Resp: 13     Temp: 98.4 F (36.9 C)  98.3 F (36.8 C) 98.2 F (36.8 C)  TempSrc: Oral  Oral Oral  SpO2: 98%  100% 99%  Weight:  131.3 kg    Height:  5' 5"  (1.651 m)      Intake/Output Summary (Last 24 hours) at 01/16/2019 0810 Last data filed at 01/16/2019 0428 Gross per 24 hour  Intake 72.28 ml  Output --  Net 72.28 ml   Last 3 Weights 01/15/2019 01/15/2019 12/12/2018  Weight (lbs) 289 lb 8 oz 275 lb 9.2 oz 272 lb  Weight (kg) 131.316 kg 125 kg 123.378 kg      Telemetry    SR - Personally Reviewed  ECG    SR with inferior Q waves (noted on previous EKGs) - Personally Reviewed  Physical Exam  Pleasant AAF, laying in bed GEN: No acute distress.   Neck: No JVD Cardiac: RRR, no murmurs, rubs, or gallops.  Respiratory: Clear to auscultation bilaterally. GI: Soft, nontender, non-distended  MS: No edema; No deformity. Neuro:  Nonfocal  Psych: Normal affect   Labs    High Sensitivity Troponin:   Recent Labs  Lab 01/15/19 1448 01/15/19 1806  TROPONINIHS 43*  781*      Chemistry Recent Labs  Lab 01/15/19 1448 01/16/19 0449  NA 136 139  K 4.4 5.4*  CL 95* 94*  CO2 19* 22  GLUCOSE 246* 126*  BUN 69* 77*  CREATININE 15.95* 17.25*  CALCIUM 8.2* 8.2*  PROT 7.1  --   ALBUMIN 3.5  --   AST 15  --   ALT 17  --   ALKPHOS 98  --   BILITOT 0.3  --   GFRNONAA 2* 2*  GFRAA 3* 2*  ANIONGAP 22* 23*     Hematology Recent Labs  Lab 01/15/19 1448 01/16/19 0449  WBC 10.8* 10.2  RBC 3.42* 3.64*  HGB 11.5* 12.3  HCT 36.4 37.6  MCV 106.4* 103.3*  MCH 33.6 33.8  MCHC 31.6 32.7  RDW 13.2 12.9  PLT 351 335    BNPNo results for input(s): BNP, PROBNP in the last 168 hours.   DDimer No results for input(s): DDIMER in the last 168 hours.   Radiology    DG Chest 2 View  Result Date: 01/15/2019 CLINICAL DATA:  Chest pain since 1 p.m., history end-stage renal disease on dialysis, hypertension, type II diabetes mellitus, coronary artery  disease EXAM: CHEST - 2 VIEW COMPARISON:  06/18/2018 FINDINGS: Normal heart size, mediastinal contours, and pulmonary vascularity. Minimal subsegmental atelectasis at lingula and RIGHT base. Lungs otherwise clear. No infiltrate, pleural effusion or pneumothorax. Bones demineralized. IMPRESSION: Minimal bibasilar atelectasis. Electronically Signed   By: Lavonia Dana M.D.   On: 01/15/2019 15:35    Cardiac Studies   I personally reviewed the cath films from the cath in February 2020 (former me as well) as in June by Dr. Saunders Revel.   TTE: pending --Review of echo performed bedside reveals relatively normal EF.  Very poor imaging.  Wall motion seems to be okay.  No evidence pericardial.  Patient Profile     53 y.o. female with a hx of CAD (moderate, non-significant by invasive hemodynamic evaluation in June 2020), chronic CP, who is being seen today for the evaluation of CP and elevated troponin at the request of Dr. Gilles Chiquito ([facility]).  Assessment & Plan    1. NSTEMI with chest pain: HsT peaked 781.  Reports this episode of chest pain was very similar to what she experienced back in June when she had her cath. Has been using SL nitro for intermittent episodes of chest pain with relief generally. Took 2 nitro the day of admission without improvement. Had associated palpitations, as well as positional chest pain several days prior to admission. Had nonobstructive CAD on cath back in June, 65% lesion in RCA. Placed on Imdur but did not tolerate. Will likely need cardiac cath to rule out progression of CAD, if unchanged suspect small vessel disease. Will review with MD.  -- Remains on IV heparin, nitropaste, ASA, BB and statin. BB added this admission.  -- Echo pending  At this point, not sure that we have any other option besides reevaluation of her coronaries with elevated troponin.  If this time around we continue to see the similar findings, would potentially consider spasm versus microvascular disease.  This would explain why she was having chest pain with Imdur.  In this case would consider switching from metoprolol to low-dose diltiazem.  2. ESRD on PD: last session was Saturday night. Nephrology consulted via primary. Still makes urine.   3. HL: on renal dosed statin. Reports she was recently switched from atorva to Crestor 2/2 to elevated LFTs.   4.Hyperkalemia: K+ 5.4 this morning. Per nephrology   For questions or updates, please contact [place] Please consult [email or web address] for contact info under       Signed, Reino Bellis, NP  01/16/2019, 8:10 AM     ATTENDING ATTESTATION  I have seen, examined and evaluated the patient this AM along with Reino Bellis, NP-C.  After reviewing all the available data and chart, we discussed the patients laboratory, study & physical findings as well as symptoms in detail. I agree with her findings, examination as well as impression recommendations as per our discussion.    Attending adjustments noted in italics.   Very unusual  situation of the woman who is now had 3 episodes of anginal sounding chest pain and positive troponins.  Last 2 catheter showing relatively moderate disease in the RCA and LAD with diffuse calcification in the LAD.  Echo looks relatively normal despite poor image quality..  I agree with the potentially the best option would be to exclude any progression of disease that can be treated with PCI.  May want to consider FFR/DFR of the LAD as well as potential RCA.   Procedure: Left Heart Catheterization with Coronary Angiography  and Possible Percutaneous Coronary Intervention  The procedure with Risks/Benefits/Alternatives and Indications was reviewed with the patient .  All questions were answered.    Risks / Complications include, but not limited to: Death, MI, CVA/TIA, VF/VT (with defibrillation), Bradycardia (need for temporary pacer placement), bleeding / bruising / hematoma / pseudoaneurysm, vascular or coronary injury (with possible emergent CT or Vascular Surgery), adverse medication reactions, infection.  Additional risks involving the use of radiation with the possibility of radiation burns and cancer were explained in detail.  The patient voices understanding and agrees to proceed.      Glenetta Hew, M.D., M.S. Interventional Cardiologist   Pager # [phone] Phone # [phone] [address] [place], [place] [address]

## 2019-01-16 NOTE — Progress Notes (Signed)
PROGRESS NOTE    Brenda Davis  IRS:[id number] DOB: Aug 15, 1965 DOA: 01/15/2019 PCP: [name], MD   Brief Narrative: Per HPI: 53 y.o. female w/ significant history of CAD, DM2, HTN, HLD, Gout, ESRD on PD who presents for intermittent symptoms in the past 2 weeks fatigue along with decreased exercise tolerance and chest pain that resolved with nitroglycerin . her Last LHC was on 06/20/2018.  She was not able to tolerated the isosorbide, she felt that it made her chest pain worse and she also developed diaphoresis with it.   In ED she was treated with fentanyl and an EKG and troponin were ordered.  Her second high sensitivity troponin came back at > 700.  Cardiology was consulted.   Subjective: Patient seen and examined.No acute events overnight. Laying on bed comfortably. Denies nausea vomiting chest pain.  Assessment & Plan:  NSTEMI with chest pain in the setting of previous CAD, trop peaked 781: Presentation similar to previous episode in June.  Appreciate cardiology input continue on IV heparin Nitropaste aspirin beta-blocker statin.  Noted cardiology plan for cardiac cath/Cardiothoracic surgery eval.   Dyslipidemia, goal LDL below 70: On renally dose statin recently changed from Lipitor to Crestor due to elevated LFTs  ESRD on peritoneal dialysis -nephro on consult for PD, did not get last night.  She makes Urine. Recent Labs  Lab 01/15/19 1448 01/16/19 0449  BUN 69* 77*  CREATININE 15.95* 17.25*   Hyperkalemia, K 5.4.Continue plan per nephrology  DM, type 2 with renal complications: Last K0X 6.6 in February.  Check A1c, add sliding scale insulin.  Blood sugar fairly controlled  Hepatic cirrhosis seen in CT, suspect from oebsity, patient denies alcohol use.  Morbid obesity  Body mass index is 48.18 kg/m.  Will need aggressive lifestyle modification and collapsed.  Patient had laparoscopic sleeve gastrectomy.  She is on vitamin B12 thiamine at home.   DVT  prophylaxis: heparin gtt Code Status: full Family Communication: plan of care discussed with patient at bedside. Disposition Plan: Remains inpatient pending further plan for nstemi   Consultants: cardio, nephro Procedures: none Microbiology: none Antimicrobials: Anti-infectives (From admission, onward)   None      Objective: Vitals:   01/15/19 2247 01/15/19 2346 01/15/19 2348 01/16/19 0616  BP: 138/72  (!) 119/99 116/73  Pulse: 64  69 72  Resp: 13     Temp: 98.4 F (36.9 C)  98.3 F (36.8 C) 98.2 F (36.8 C)  TempSrc: Oral  Oral Oral  SpO2: 98%  100% 99%  Weight:  131.3 kg    Height:  5' 5"  (1.651 m)      Intake/Output Summary (Last 24 hours) at 01/16/2019 0800 Last data filed at 01/16/2019 0428 Gross per 24 hour  Intake 72.28 ml  Output --  Net 72.28 ml   Filed Weights   01/15/19 2000 01/15/19 2346  Weight: 125 kg 131.3 kg   Weight change:   Body mass index is 48.18 kg/m.  Intake/Output from previous day: 12/27 0701 - 12/28 0700 In: 72.3 [I.V.:72.3] Out: -  Intake/Output this shift: No intake/output data recorded.  Examination:  General exam: AAOx3,NAD,obese. HEENT:Oral mucosa moist, Ear/Nose WNL grossly, dentition normal. Respiratory system: Diminished at the base,no wheezing or crackles,no use of accessory muscle Cardiovascular system: S1 & S2 +, No JVD,. Gastrointestinal system: Abdomen soft, Obese, PD cath in place w intact dressing, BS+ Nervous System:Alert, awake, moving extremities and grossly nonfocal Extremities: No edema, distal peripheral pulses palpable.  Skin: No rashes,no  icterus. MSK: Normal muscle bulk,tone, power  Medications:  Scheduled Meds: . aspirin EC  81 mg Oral Daily  . calcium acetate  667 mg Oral TID WC  . cinacalcet  90 mg Oral Q supper  . ferric citrate  210 mg Oral TID WC  . folic acid  1 mg Oral Daily  . gabapentin  100 mg Oral BID  . metoprolol tartrate  12.5 mg Oral BID  . nitroGLYCERIN  0.5 inch Topical Q6H   . rosuvastatin  10 mg Oral Daily  . sodium chloride flush  3 mL Intravenous Once   Continuous Infusions: . heparin 1,050 Units/hr (01/15/19 2135)    Data Reviewed: I have personally reviewed following labs and imaging studies  CBC: Recent Labs  Lab 01/15/19 1448 01/16/19 0449  WBC 10.8* 10.2  HGB 11.5* 12.3  HCT 36.4 37.6  MCV 106.4* 103.3*  PLT 351 161   Basic Metabolic Panel: Recent Labs  Lab 01/15/19 1448 01/16/19 0449  NA 136 139  K 4.4 5.4*  CL 95* 94*  CO2 19* 22  GLUCOSE 246* 126*  BUN 69* 77*  CREATININE 15.95* 17.25*  CALCIUM 8.2* 8.2*   GFR: Estimated Creatinine Clearance: 5.1 mL/min (A) (by C-G formula based on SCr of 17.25 mg/dL (H)). Liver Function Tests: Recent Labs  Lab 01/15/19 1448  AST 15  ALT 17  ALKPHOS 98  BILITOT 0.3  PROT 7.1  ALBUMIN 3.5   No results for input(s): LIPASE, AMYLASE in the last 168 hours. No results for input(s): AMMONIA in the last 168 hours. Coagulation Profile: No results for input(s): INR, PROTIME in the last 168 hours. Cardiac Enzymes: No results for input(s): CKTOTAL, CKMB, CKMBINDEX, TROPONINI in the last 168 hours. BNP (last 3 results) No results for input(s): PROBNP in the last 8760 hours. HbA1C: No results for input(s): HGBA1C in the last 72 hours. CBG: No results for input(s): GLUCAP in the last 168 hours. Lipid Profile: Recent Labs    01/16/19 0449  CHOL 185  HDL 39*  LDLCALC 116*  TRIG 150*  CHOLHDL 4.7   Thyroid Function Tests: No results for input(s): TSH, T4TOTAL, FREET4, T3FREE, THYROIDAB in the last 72 hours. Anemia Panel: No results for input(s): VITAMINB12, FOLATE, FERRITIN, TIBC, IRON, RETICCTPCT in the last 72 hours. Sepsis Labs: No results for input(s): PROCALCITON, LATICACIDVEN in the last 168 hours.  Recent Results (from the past 240 hour(s))  SARS CORONAVIRUS 2 (TAT 6-24 HRS) Nasopharyngeal Nasopharyngeal Swab     Status: None   Collection Time: 01/15/19  9:38 PM    Specimen: Nasopharyngeal Swab  Result Value Ref Range Status   SARS Coronavirus 2 NEGATIVE NEGATIVE Final    Comment: (NOTE) SARS-CoV-2 target nucleic acids are NOT DETECTED. The SARS-CoV-2 RNA is generally detectable in upper and lower respiratory specimens during the acute phase of infection. Negative results do not preclude SARS-CoV-2 infection, do not rule out co-infections with other pathogens, and should not be used as the sole basis for treatment or other patient management decisions. Negative results must be combined with clinical observations, patient history, and epidemiological information. The expected result is Negative. Fact Sheet for Patients: SugarRoll.be Fact Sheet for Healthcare Providers: https://www.woods-mathews.com/ This test is not yet approved or cleared by the [place] FDA and  has been authorized for detection and/or diagnosis of SARS-CoV-2 by FDA under an Emergency Use Authorization (EUA). This EUA will remain  in effect (meaning this test can be used) for the duration of the COVID-19 declaration  under Section 56 4(b)(1) of the Act, 21 U.S.C. section 360bbb-3(b)(1), unless the authorization is terminated or revoked sooner. Performed at [facility], [place] [address] [place], [place] [address]       Radiology Studies: DG Chest 2 View  Result Date: 01/15/2019 CLINICAL DATA:  Chest pain since 1 p.m., history end-stage renal disease on dialysis, hypertension, type II diabetes mellitus, coronary artery disease EXAM: CHEST - 2 VIEW COMPARISON:  06/18/2018 FINDINGS: Normal heart size, mediastinal contours, and pulmonary vascularity. Minimal subsegmental atelectasis at lingula and RIGHT base. Lungs otherwise clear. No infiltrate, pleural effusion or pneumothorax. Bones demineralized. IMPRESSION: Minimal bibasilar atelectasis. Electronically Signed   By: Lavonia Dana M.D.   On: 01/15/2019 15:35      LOS: 1 day    Time spent: More than 50% of that time was spent in counseling and/or coordination of care.  Antonieta Pert, MD [facility]  01/16/2019, 8:00 AM

## 2019-01-16 NOTE — Consult Note (Signed)
[facility] Renal Consultation Note    Indication for Consultation:  Management of ESRD/hemodialysis; anemia, hypertension/volume and secondary hyperparathyroidism   HPI: Brenda Davis is a 53 y.o. female ESRD on CCPD, CAD, HTN, hyperlipidemia T2DM. Hx NSTEMI in 05/2018. She had LHC in 06/2018 with multivessal CAD, non-obs. Medical therapy recommended at that time.   She was admitted with chest pain and elevated troponin. EKG NSR without acute changes. Evaluated by cardiology and underwent repeat LHC today with progressive multivessel disease. PCI vs. CABG recommended. TCTS consulted for possible CABG.  Seen and examined at bedside. Chest pain improved. No SOB, N,V, abd pain. On heparin gtt. Leaning towards PCI, discussing with her husband. Wants to make lifestyle changes. Last dialysis Saturday,has been compliant with dialysis Rx.   Past Medical History:  Diagnosis Date  . Anemia   . CAD (coronary artery disease)    a. cath in 02/2018 showing moderate 3-vessel CAD with 45% mid-LADm 55% LCx and 65% RCA stenosis which was not significant by FFR  . ESRD on dialysis ([place])   . Gout   . HCAP (healthcare-associated pneumonia) 05/06/2016  . History of blood transfusion    "related to surgery"  . Hyperlipidemia   . Hypertension   . Morbid obesity ([place])   . Pain    LEFT SHOULDER PAIN - WAS SEEN AT AN URGENT CARE - GIVEN SLING FOR COMFORT AND TOLD ROM AS TOLERATED.  Marland Kitchen Palpitations 09/24/2016  . Peritonitis, dialysis-associated ([place])   . Type II diabetes mellitus ([place])    "gastric sleeve OR corrected this" (05/06/2016)   Past Surgical History:  Procedure Laterality Date  . AV FISTULA PLACEMENT Left 01/03/2015   Procedure: BRACHIAL CEPHALIC ARTERIOVENOUS  FISTULA CREATION LEFT ARM;  Surgeon: Angelia Mould, MD;  Location: [facility];  Service: Vascular;  Laterality: Left;  . CARPAL TUNNEL RELEASE Right   . [place]  . CHOLECYSTECTOMY  09/14/2017   . ESOPHAGOGASTRODUODENOSCOPY N/A 09/04/2012   Procedure: ESOPHAGOGASTRODUODENOSCOPY (EGD);  Surgeon: Shann Medal, MD;  Location: [facility];  Service: General;  Laterality: N/A;  PF  . ESOPHAGOGASTRODUODENOSCOPY (EGD) WITH ESOPHAGEAL DILATION N/A 09/29/2012   Procedure: ESOPHAGOGASTRODUODENOSCOPY (EGD) WITH ESOPHAGEAL DILATION;  Surgeon: Milus Banister, MD;  Location: [facility];  Service: Endoscopy;  Laterality: N/A;  . INSERTION OF DIALYSIS CATHETER N/A 01/03/2015   Procedure: INSERTION OF DIALYSIS CATHETER RIGHT INTERNAL JUGULAR;  Surgeon: Angelia Mould, MD;  Location: [facility];  Service: Vascular;  Laterality: N/A;  . LAPAROSCOPIC GASTRIC SLEEVE RESECTION N/A 07/19/2012   Procedure: LAPAROSCOPIC SLEEVE GASTRECTOMY with EGD;  Surgeon: Madilyn Hook, DO;  Location: [facility];  Service: General;  Laterality: N/A;  laparoscopic sleeve gastrectomy with EGD  . LEFT HEART CATH AND CORONARY ANGIOGRAPHY N/A 02/25/2018   Procedure: LEFT HEART CATH AND CORONARY ANGIOGRAPHY;  Surgeon: Leonie Man, MD;  Location: [facility];  Service: Cardiovascular;  Laterality: N/A;  . LEFT HEART CATH AND CORONARY ANGIOGRAPHY N/A 06/20/2018   Procedure: LEFT HEART CATH AND CORONARY ANGIOGRAPHY;  Surgeon: Nelva Bush, MD;  Location: [facility];  Service: Cardiovascular;  Laterality: N/A;  . LEFT HEART CATH AND CORONARY ANGIOGRAPHY N/A 01/16/2019   Procedure: LEFT HEART CATH AND CORONARY ANGIOGRAPHY;  Surgeon: Jettie Booze, MD;  Location: [facility];  Service: Cardiovascular;  Laterality: N/A;  . PERIPHERAL VASCULAR CATHETERIZATION Left 05/23/2015   Procedure: Nolon Stalls;  Surgeon: Conrad [place], MD;  Location: [facility];  Service: Cardiovascular;  Laterality: Left;  upper aRM  . PERIPHERAL VASCULAR CATHETERIZATION Left 05/23/2015   Procedure: Peripheral Vascular Balloon Angioplasty;  Surgeon: Conrad [place], MD;  Location: [facility];  Service: Cardiovascular;   Laterality: Left;  av fistula  . TUBAL LIGATION  1993  . UPPER GI ENDOSCOPY N/A 07/19/2012   Procedure: UPPER GI ENDOSCOPY;  Surgeon: Madilyn Hook, DO;  [facility]: [facility];  Service: General;  Laterality: N/A;   Family History  Problem Relation Age of Onset  . Hypertension Mother   . Mitral valve prolapse Mother   . Diabetes Father   . Arthritis Other   . Hyperlipidemia Other   . Cancer Neg Hx   . Heart disease Neg Hx   . Kidney disease Neg Hx   . Stroke Neg Hx    Social History:  reports that she quit smoking about 11 years ago. Her smoking use included cigarettes. She has a 16.00 pack-year smoking history. She has never used smokeless tobacco. She reports that she does not drink alcohol or use drugs. Allergies  Allergen Reactions  . Amlodipine Swelling  . Atorvastatin     Elevated LFT's  . Clonidine Derivatives Swelling    Limbs swell  . Doxycycline Nausea And Vomiting    "I threw up for 3 hours"  . Welchol [Colesevelam Hcl] Nausea Only   Prior to Admission medications   Medication Sig Start Date End Date Taking? Authorizing Provider  aspirin EC 81 MG EC tablet Take 1 tablet (81 mg total) by mouth daily. 02/26/18  Yes Smith, Rondell A, MD  AURYXIA 1 GM 210 MG(Fe) tablet Take 210 mg by mouth 3 (three) times daily with meals.  04/07/17  Yes [provider]  calcium acetate (PHOSLO) 667 MG capsule Take 667 mg by mouth 3 (three) times daily with meals. 06/02/18  Yes [provider]  cinacalcet (SENSIPAR) 90 MG tablet Take 90 mg by mouth at bedtime. 12/08/18  Yes [provider]  Cyanocobalamin (B-12 PO) Take 1 tablet by mouth daily.   Yes [provider]  folic acid (FOLVITE) 1 MG tablet Take 1 tablet (1 mg total) by mouth daily. 03/08/18  Yes [name], MD  gabapentin (NEURONTIN) 100 MG capsule Take 100 mg by mouth 2 (two) times daily. 10/31/18  Yes [provider]  gentamicin cream (GARAMYCIN) 0.1 % Apply 1 application topically See  admin instructions. Apply to exit site daily as directed. 04/26/17  Yes [provider]  nitroGLYCERIN (NITROSTAT) 0.4 MG SL tablet PLACE 1 TABLET (0.4 MG TOTAL) UNDER THE TONGUE EVERY 5 (FIVE) MINUTES AS NEEDED FOR CHEST PAIN. 08/01/18  Yes Skeet Latch, MD  rosuvastatin (CRESTOR) 10 MG tablet Take 1 tablet (10 mg total) by mouth daily. 12/12/18 03/12/19 Yes Skeet Latch, MD  ondansetron (ZOFRAN ODT) 8 MG disintegrating tablet Take 1 tablet (8 mg total) by mouth every 8 (eight) hours as needed for nausea or vomiting. Patient not taking: Reported on 01/15/2019 04/09/18   Jola Schmidt, MD   Current Facility-Administered Medications  Medication Dose Route Frequency Provider Last Rate Last Admin  . 0.9 %  sodium chloride infusion  250 mL Intravenous PRN Jettie Booze, MD      . acetaminophen (TYLENOL) tablet 650 mg  650 mg Oral Q4H PRN Jettie Booze, MD      . aspirin EC tablet 81 mg  81 mg Oral Daily Jettie Booze, MD   81 mg at 01/16/19 1034  .  calcium acetate (PHOSLO) capsule 667 mg  667 mg Oral TID WC Jettie Booze, MD   667 mg at 01/16/19 1333  . cinacalcet (SENSIPAR) tablet 90 mg  90 mg Oral Q supper Jettie Booze, MD      . dialysis solution 1.5% low-MG/low-CA dianeal solution   Intraperitoneal Q24H Roney Jaffe, MD      . dialysis solution 2.5% low-MG/low-CA dianeal solution   Intraperitoneal Q24H Roney Jaffe, MD      . fentaNYL (SUBLIMAZE) injection 25 mcg  25 mcg Intravenous Q2H PRN Jettie Booze, MD      . ferric citrate (AURYXIA) tablet 210 mg  210 mg Oral TID WC Jettie Booze, MD   210 mg at 01/16/19 1333  . folic acid (FOLVITE) tablet 1 mg  1 mg Oral Daily Jettie Booze, MD   1 mg at 01/16/19 1033  . gabapentin (NEURONTIN) capsule 100 mg  100 mg Oral BID Jettie Booze, MD   100 mg at 01/16/19 1035  . gentamicin cream (GARAMYCIN) 0.1 % 1 application  1 application Topical PRN Jettie Booze, MD       . gentamicin cream (GARAMYCIN) 0.1 % 1 application  1 application Topical Daily Roney Jaffe, MD      . heparin 2,500 Units in dialysis solution 1.5% low-MG/low-CA 5,000 mL dialysis solution   Peritoneal Dialysis PRN Antonieta Pert, MD      . heparin 2,500 Units in dialysis solution 2.5% low-MG/low-CA 5,000 mL dialysis solution   Peritoneal Dialysis PRN Kc, Maren [place], MD      . heparin ADULT infusion 100 units/mL (25000 units/235m sodium chloride 0.45%)  1,050 Units/hr Intravenous Continuous Carney, Jessica C, RPH      . hydrALAZINE (APRESOLINE) injection 10 mg  10 mg Intravenous Q20 Min PRN VLarae GroomsS, MD      . labetalol (NORMODYNE) injection 10 mg  10 mg Intravenous Q10 min PRN VJettie Booze MD      . metoprolol tartrate (LOPRESSOR) tablet 12.5 mg  12.5 mg Oral BID VJettie Booze MD   12.5 mg at 01/16/19 1035  . nitroGLYCERIN (NITROGLYN) 2 % ointment 0.5 inch  0.5 inch Topical Q6H VJettie Booze MD   0.5 inch at 01/16/19 [phone] . nitroGLYCERIN (NITROSTAT) SL tablet 0.4 mg  0.4 mg Sublingual Q5 Min x 3 PRN VJettie Booze MD      . ondansetron ([facility] injection 4 mg  4 mg Intravenous Q6H PRN VJettie Booze MD      . rosuvastatin (CRESTOR) tablet 10 mg  10 mg Oral Daily VJettie Booze MD   10 mg at 01/16/19 1033  . sodium chloride flush (NS) 0.9 % injection 3 mL  3 mL Intravenous Once VLarae GroomsS, MD      . sodium chloride flush (NS) 0.9 % injection 3 mL  3 mL Intravenous Q12H VLarae GroomsS, MD      . sodium chloride flush (NS) 0.9 % injection 3 mL  3 mL Intravenous PRN VJettie Booze MD         ROS: As per HPI otherwise negative.  Physical Exam: Vitals:   01/16/19 1150 01/16/19 1155 01/16/19 1200 01/16/19 1343  BP: 117/66 125/66 120/72   Pulse: 75 72 74   Resp: 18 10 (!) 8 18  Temp:    97.9 F (36.6 C)  TempSrc:    Axillary  SpO2: 97% 100% 100% 100%  Weight:  Height:         General: WNWD female, alert,  nad  Head: NCAT sclera not icteric MMM Neck: Supple. No JVD No masses Lungs: CTA bilaterally without wheezes, rales, or rhonchi. Breathing is unlabored. Heart: RRR with S1 S2 Abdomen: obese, soft non-tender, PD cath in place  Lower extremities:without edema or ischemic changes, no open wounds  Neuro: A & O  X 3. Moves all extremities spontaneously. Psych:  Responds to questions appropriately with a normal affect. Dialysis Access: LUE AVF +bruit   Labs: Basic Metabolic Panel: Recent Labs  Lab 01/15/19 1448 01/16/19 0449  NA 136 139  K 4.4 5.4*  CL 95* 94*  CO2 19* 22  GLUCOSE 246* 126*  BUN 69* 77*  CREATININE 15.95* 17.25*  CALCIUM 8.2* 8.2*   Liver Function Tests: Recent Labs  Lab 01/15/19 1448  AST 15  ALT 17  ALKPHOS 98  BILITOT 0.3  PROT 7.1  ALBUMIN 3.5   No results for input(s): LIPASE, AMYLASE in the last 168 hours. No results for input(s): AMMONIA in the last 168 hours. CBC: Recent Labs  Lab 01/15/19 1448 01/16/19 0449  WBC 10.8* 10.2  HGB 11.5* 12.3  HCT 36.4 37.6  MCV 106.4* 103.3*  PLT 351 335   Cardiac Enzymes: No results for input(s): CKTOTAL, CKMB, CKMBINDEX, TROPONINI in the last 168 hours. CBG: No results for input(s): GLUCAP in the last 168 hours. Iron Studies: No results for input(s): IRON, TIBC, TRANSFERRIN, FERRITIN in the last 72 hours. Studies/Results: DG Chest 2 View  Result Date: 01/15/2019 CLINICAL DATA:  Chest pain since 1 p.m., history end-stage renal disease on dialysis, hypertension, type II diabetes mellitus, coronary artery disease EXAM: CHEST - 2 VIEW COMPARISON:  06/18/2018 FINDINGS: Normal heart size, mediastinal contours, and pulmonary vascularity. Minimal subsegmental atelectasis at lingula and RIGHT base. Lungs otherwise clear. No infiltrate, pleural effusion or pneumothorax. Bones demineralized. IMPRESSION: Minimal bibasilar atelectasis. Electronically Signed   By: Lavonia Dana M.D.   On: 01/15/2019 15:35   CARDIAC  CATHETERIZATION  Result Date: 01/16/2019  Mid LAD lesion is 75% stenosed. The LAD is diffusely diseased and heavily calcified.  Mid Cx to Dist Cx lesion is 55% stenosed with 40% stenosed side branch in Ost 3rd Mrg.  Prox RCA lesion is 75% stenosed. This disease appears to have progressed.  Ost 1st Mrg-1 lesion is 50% stenosed.  Ost 1st Mrg-2 lesion is 99% stenosed. This lesion is significantly worse than prior.  The left ventricular systolic function is normal.  LV end diastolic pressure is normal.  The left ventricular ejection fraction is 55-65% by visual estimate.  There is no aortic valve stenosis.  Ost LAD to Mid LAD lesion is 40% stenosed.  Severe multivessel disease with progression of calcific disease in the mid LAD, proximal RCA and tortuous mid OM1.  Plan for surgical consult.    ECHOCARDIOGRAM COMPLETE  Result Date: 01/16/2019   ECHOCARDIOGRAM REPORT   Patient Name:   Brenda Davis Date of Exam: 01/16/2019 Medical Rec #:  [id number]                 Height:       65.0 in Accession #:    [id number]                Weight:       289.5 lb Date of Birth:  1965/06/19                BSA:  2.31 m Patient Age:    49 years                  BP:           116/73 mmHg Patient Gender: F                         HR:           69 bpm. Exam Location:  Inpatient Procedure: 2D Echo, Color Doppler, Cardiac Doppler and Intracardiac            Opacification Agent Indications:    Acute MI  History:        Patient has prior history of Echocardiogram examinations, most                 recent 01/04/2015. CAD; Risk Factors:Diabetes, Hypertension and                 Dyslipidemia. ESRD.  Sonographer:    Raquel Sarna Senior RDCS Referring Phys: [phone] [facility] MARTIN  Sonographer Comments: Technically difficult study due to poor echo windows. IMPRESSIONS  1. Left ventricular ejection fraction, by visual estimation, is 65 to 70%. The left ventricle has hyperdynamic function. There is moderately  increased left ventricular hypertrophy.  2. Definity contrast agent was given IV to delineate the left ventricular endocardial borders.  3. Left ventricular diastolic parameters are consistent with Grade I diastolic dysfunction (impaired relaxation).  4. The left ventricle has no regional wall motion abnormalities.  5. Global right ventricle has normal systolic function.The right ventricular size is mildly enlarged. No increase in right ventricular wall thickness.  6. Left atrial size was normal.  7. Right atrial size was normal.  8. The pericardial effusion is posterior to the left ventricle.  9. Trivial pericardial effusion is present. 10. The mitral valve was not well visualized. Trivial mitral valve regurgitation. 11. The tricuspid valve is not well visualized. 12. The aortic valve is tricuspid. Aortic valve regurgitation is not visualized. Mild aortic valve sclerosis without stenosis. 13. The pulmonic valve was grossly normal. Pulmonic valve regurgitation is not visualized. 14. The inferior vena cava is normal in size with <50% respiratory variability, suggesting right atrial pressure of 8 mmHg. 15. The interatrial septum was not well visualized. FINDINGS  Left Ventricle: Left ventricular ejection fraction, by visual estimation, is 65 to 70%. The left ventricle has hyperdynamic function. Definity contrast agent was given IV to delineate the left ventricular endocardial borders. The left ventricle has no regional wall motion abnormalities. There is moderately increased left ventricular hypertrophy. Left ventricular diastolic parameters are consistent with Grade I diastolic dysfunction (impaired relaxation). Indeterminate filling pressures. Right Ventricle: The right ventricular size is mildly enlarged. No increase in right ventricular wall thickness. Global RV systolic function is has normal systolic function. Left Atrium: Left atrial size was normal in size. Right Atrium: Right atrial size was normal in size  Pericardium: Trivial pericardial effusion is present. The pericardial effusion is posterior to the left ventricle. Mitral Valve: The mitral valve was not well visualized. Trivial mitral valve regurgitation. Tricuspid Valve: The tricuspid valve is not well visualized. Tricuspid valve regurgitation is not demonstrated. Aortic Valve: The aortic valve is tricuspid. Aortic valve regurgitation is not visualized. Mild aortic valve sclerosis is present, with no evidence of aortic valve stenosis. Pulmonic Valve: The pulmonic valve was grossly normal. Pulmonic valve regurgitation is not visualized. Pulmonic regurgitation is not visualized. Aorta: The aortic root and ascending  aorta are structurally normal, with no evidence of dilitation. Venous: The inferior vena cava is normal in size with less than 50% respiratory variability, suggesting right atrial pressure of 8 mmHg. IAS/Shunts: The interatrial septum was not well visualized.  LEFT VENTRICLE PLAX 2D LVIDd:         3.84 cm  Diastology LVIDs:         2.29 cm  LV e' lateral:   5.11 cm/s LV PW:         1.25 cm  LV E/e' lateral: 17.2 LV IVS:        1.27 cm  LV e' medial:    5.11 cm/s LVOT diam:     2.00 cm  LV E/e' medial:  17.2 LV SV:         46 ml LV SV Index:   18.01 LVOT Area:     3.14 cm  RIGHT VENTRICLE RV S prime:     12.40 cm/s TAPSE (M-mode): 1.5 cm LEFT ATRIUM             Index       RIGHT ATRIUM           Index LA diam:        4.20 cm 1.81 cm/m  RA Area:     20.50 cm LA Vol (A2C):   56.6 ml 24.45 ml/m RA Volume:   66.70 ml  28.81 ml/m LA Vol (A4C):   40.1 ml 17.32 ml/m LA Biplane Vol: 49.5 ml 21.38 ml/m  AORTIC VALVE LVOT Vmax:   78.30 cm/s LVOT Vmean:  53.400 cm/s LVOT VTI:    0.152 m  AORTA Ao Root diam: 2.70 cm Ao Asc diam:  3.30 cm MITRAL VALVE MV Area (PHT): 2.60 cm              SHUNTS MV PHT:        84.68 msec            Systemic VTI:  0.15 m MV Decel Time: 292 msec              Systemic Diam: 2.00 cm MV E velocity: 87.80 cm/s  103 cm/s MV A velocity:  102.00 cm/s 70.3 cm/s MV E/A ratio:  0.86        1.5  Lyman Bishop MD Electronically signed by Lyman Bishop MD Signature Date/Time: 01/16/2019/10:43:37 AM    Final     Dialysis Orders:  CCPD 6 exchanges 2.7L 1.5h dwell No day dwell. (Patient says she has been doing 5  exchanges)   Assessment/Plan: 1. NSTEMI. Known multivessel CAD.  LHC today with progressive severe 3V disease. TCTS consult for possible CABG.  On heparin gtt, nitro, ASA, BB per cardiology.  2. ESRD -  Cr 17.25 K 5.4. Continue CCPD for now. Orders written. Follow labs after PD.  3. Hypertension/volume  - BP controlled. Low this admission. No volume on exam. Use 1.5%/ 2.5% combination on PD.  4. Anemia  - Follow Hgb here. On ESA as outpatient  5. Metabolic bone disease -  Ca ok. Continue home binders.  6. Nutrition - Renal diet/vitamins  Lynnda Child PA-C [facility] Pager [phone] 01/16/2019, 4:01 PM

## 2019-01-16 NOTE — Progress Notes (Signed)
[place] for Heparin  Indication: chest pain/ACS  Allergies  Allergen Reactions  . Amlodipine Swelling  . Atorvastatin     Elevated LFT's  . Clonidine Derivatives Swelling    Limbs swell  . Doxycycline Nausea And Vomiting    "I threw up for 3 hours"  . Welchol [Colesevelam Hcl] Nausea Only    Patient Measurements: Height: 5' 5"  (165.1 cm) Weight: 289 lb 8 oz (131.3 kg) IBW/kg (Calculated) : 57 Heparin Dosing Weight: 87.4 kg  Vital Signs: Temp: 98.2 F (36.8 C) (12/28 0616) Temp Source: Oral (12/28 0616) BP: 120/72 (12/28 1200) Pulse Rate: 74 (12/28 1200)  Labs: Recent Labs    01/15/19 1448 01/15/19 1806 01/16/19 0449 01/16/19 1017  HGB 11.5*  --  12.3  --   HCT 36.4  --  37.6  --   PLT 351  --  335  --   HEPARINUNFRC  --   --  0.38 0.34  CREATININE 15.95*  --  17.25*  --   TROPONINIHS 43* 781*  --   --     Estimated Creatinine Clearance: 5.1 mL/min (A) (by C-G formula based on SCr of 17.25 mg/dL (H)).   Medical History: Past Medical History:  Diagnosis Date  . Anemia   . CAD (coronary artery disease)    a. cath in 02/2018 showing moderate 3-vessel CAD with 45% mid-LADm 55% LCx and 65% RCA stenosis which was not significant by FFR  . ESRD on dialysis ([place])   . Gout   . HCAP (healthcare-associated pneumonia) 05/06/2016  . History of blood transfusion    "related to surgery"  . Hyperlipidemia   . Hypertension   . Morbid obesity ([place])   . Pain    LEFT SHOULDER PAIN - WAS SEEN AT AN URGENT CARE - GIVEN SLING FOR COMFORT AND TOLD ROM AS TOLERATED.  Marland Kitchen Palpitations 09/24/2016  . Peritonitis, dialysis-associated ([place])   . Type II diabetes mellitus ([place])    "gastric sleeve OR corrected this" (05/06/2016)    Medications:  Scheduled:  . aspirin EC  81 mg Oral Daily  . calcium acetate  667 mg Oral TID WC  . cinacalcet  90 mg Oral Q supper  . ferric citrate  210 mg Oral TID WC  . folic acid  1 mg Oral Daily  .  gabapentin  100 mg Oral BID  . gentamicin cream  1 application Topical Daily  . metoprolol tartrate  12.5 mg Oral BID  . nitroGLYCERIN  0.5 inch Topical Q6H  . rosuvastatin  10 mg Oral Daily  . sodium chloride flush  3 mL Intravenous Once  . sodium chloride flush  3 mL Intravenous Q12H    Assessment: Patient is a 58 yof that presented to the ED with c/o chest pain. The patient had a slightly elevated trop and repeat was significantly increased. Pharmacy was asked to start a heparin drip on this patient.   Now s/p cath lab this morning, diffuse dx, awaiting TCTS consult.  Pharmacy asked to resume heparin 8 hrs after sheath out.  Sheath pulled around noon today.  Heparin level previously therapeutic x 2 on 1050 units/hr.  No overt bleeding or complications noted.  Goal of Therapy:  Heparin level 0.3-0.7 units/ml Monitor platelets by anticoagulation protocol: Yes   Plan:  - Resume IV heparin at 8 pm at 1050 units/hr. - Check heparin level 8 hrs after gtt starts. - Daily heparin level and CBC. -F/u plans for possible  CABG.  Marguerite Olea, [facility] Clinical Pharmacist Phone [phone]  01/16/2019 1:29 PM

## 2019-01-16 NOTE — Interval H&P Note (Signed)
Cath Lab Visit (complete for each Cath Lab visit)  Clinical Evaluation Leading to the Procedure:   ACS: Yes.    Non-ACS:    Anginal Classification: CCS IV  Anti-ischemic medical therapy: Minimal Therapy (1 class of medications)  Non-Invasive Test Results: No non-invasive testing performed  Prior CABG: No previous CABG      History and Physical Interval Note:  01/16/2019 11:15 AM  Brenda Davis  has presented today for surgery, with the diagnosis of n stemi.  The various methods of treatment have been discussed with the patient and family. After consideration of risks, benefits and other options for treatment, the patient has consented to  Procedure(s): LEFT HEART CATH AND CORONARY ANGIOGRAPHY (N/A) as a surgical intervention.  The patient's history has been reviewed, patient examined, no change in status, stable for surgery.  I have reviewed the patient's chart and labs.  Questions were answered to the patient's satisfaction.     Larae Grooms

## 2019-01-16 NOTE — Progress Notes (Addendum)
Progress Note  Patient Name: Brenda Davis Date of Encounter: 01/16/2019  Primary Cardiologist: Skeet Latch, MD   Subjective   No chest pain this morning.   Inpatient Medications    Scheduled Meds: . aspirin EC  81 mg Oral Daily  . calcium acetate  667 mg Oral TID WC  . cinacalcet  90 mg Oral Q supper  . ferric citrate  210 mg Oral TID WC  . folic acid  1 mg Oral Daily  . gabapentin  100 mg Oral BID  . metoprolol tartrate  12.5 mg Oral BID  . nitroGLYCERIN  0.5 inch Topical Q6H  . rosuvastatin  10 mg Oral Daily  . sodium chloride flush  3 mL Intravenous Once   Continuous Infusions: . heparin 1,050 Units/hr (01/15/19 2135)   PRN Meds: acetaminophen, fentaNYL (SUBLIMAZE) injection, gentamicin cream, nitroGLYCERIN, ondansetron (ZOFRAN) IV   Vital Signs    Vitals:   01/15/19 2247 01/15/19 2346 01/15/19 2348 01/16/19 0616  BP: 138/72  (!) 119/99 116/73  Pulse: 64  69 72  Resp: 13     Temp: 98.4 F (36.9 C)  98.3 F (36.8 C) 98.2 F (36.8 C)  TempSrc: Oral  Oral Oral  SpO2: 98%  100% 99%  Weight:  131.3 kg    Height:  5' 5"  (1.651 m)      Intake/Output Summary (Last 24 hours) at 01/16/2019 0810 Last data filed at 01/16/2019 0428 Gross per 24 hour  Intake 72.28 ml  Output --  Net 72.28 ml   Last 3 Weights 01/15/2019 01/15/2019 12/12/2018  Weight (lbs) 289 lb 8 oz 275 lb 9.2 oz 272 lb  Weight (kg) 131.316 kg 125 kg 123.378 kg      Telemetry    SR - Personally Reviewed  ECG    SR with inferior Q waves (noted on previous EKGs) - Personally Reviewed  Physical Exam  Pleasant AAF, laying in bed GEN: No acute distress.   Neck: No JVD Cardiac: RRR, no murmurs, rubs, or gallops.  Respiratory: Clear to auscultation bilaterally. GI: Soft, nontender, non-distended  MS: No edema; No deformity. Neuro:  Nonfocal  Psych: Normal affect   Labs    High Sensitivity Troponin:   Recent Labs  Lab 01/15/19 1448 01/15/19 1806  TROPONINIHS 43*  781*      Chemistry Recent Labs  Lab 01/15/19 1448 01/16/19 0449  NA 136 139  K 4.4 5.4*  CL 95* 94*  CO2 19* 22  GLUCOSE 246* 126*  BUN 69* 77*  CREATININE 15.95* 17.25*  CALCIUM 8.2* 8.2*  PROT 7.1  --   ALBUMIN 3.5  --   AST 15  --   ALT 17  --   ALKPHOS 98  --   BILITOT 0.3  --   GFRNONAA 2* 2*  GFRAA 3* 2*  ANIONGAP 22* 23*     Hematology Recent Labs  Lab 01/15/19 1448 01/16/19 0449  WBC 10.8* 10.2  RBC 3.42* 3.64*  HGB 11.5* 12.3  HCT 36.4 37.6  MCV 106.4* 103.3*  MCH 33.6 33.8  MCHC 31.6 32.7  RDW 13.2 12.9  PLT 351 335    BNPNo results for input(s): BNP, PROBNP in the last 168 hours.   DDimer No results for input(s): DDIMER in the last 168 hours.   Radiology    DG Chest 2 View  Result Date: 01/15/2019 CLINICAL DATA:  Chest pain since 1 p.m., history end-stage renal disease on dialysis, hypertension, type II diabetes mellitus, coronary artery  disease EXAM: CHEST - 2 VIEW COMPARISON:  06/18/2018 FINDINGS: Normal heart size, mediastinal contours, and pulmonary vascularity. Minimal subsegmental atelectasis at lingula and RIGHT base. Lungs otherwise clear. No infiltrate, pleural effusion or pneumothorax. Bones demineralized. IMPRESSION: Minimal bibasilar atelectasis. Electronically Signed   By: Lavonia Dana M.D.   On: 01/15/2019 15:35    Cardiac Studies   I personally reviewed the cath films from the cath in February 2020 (former me as well) as in June by Dr. Saunders Revel.   TTE: pending --Review of echo performed bedside reveals relatively normal EF.  Very poor imaging.  Wall motion seems to be okay.  No evidence pericardial.  Patient Profile     53 y.o. female with a hx of CAD (moderate, non-significant by invasive hemodynamic evaluation in June 2020), chronic CP, who is being seen today for the evaluation of CP and elevated troponin at the request of Dr. Gilles Chiquito ([facility]).  Assessment & Plan    1. NSTEMI with chest pain: HsT peaked 781.  Reports this episode of chest pain was very similar to what she experienced back in June when she had her cath. Has been using SL nitro for intermittent episodes of chest pain with relief generally. Took 2 nitro the day of admission without improvement. Had associated palpitations, as well as positional chest pain several days prior to admission. Had nonobstructive CAD on cath back in June, 65% lesion in RCA. Placed on Imdur but did not tolerate. Will likely need cardiac cath to rule out progression of CAD, if unchanged suspect small vessel disease. Will review with MD.  -- Remains on IV heparin, nitropaste, ASA, BB and statin. BB added this admission.  -- Echo pending  At this point, not sure that we have any other option besides reevaluation of her coronaries with elevated troponin.  If this time around we continue to see the similar findings, would potentially consider spasm versus microvascular disease.  This would explain why she was having chest pain with Imdur.  In this case would consider switching from metoprolol to low-dose diltiazem.  2. ESRD on PD: last session was Saturday night. Nephrology consulted via primary. Still makes urine.   3. HL: on renal dosed statin. Reports she was recently switched from atorva to Crestor 2/2 to elevated LFTs.   4.Hyperkalemia: K+ 5.4 this morning. Per nephrology   For questions or updates, please contact [facility] Please consult [email or web address] for contact info under       Signed, Reino Bellis, NP  01/16/2019, 8:10 AM     ATTENDING ATTESTATION  I have seen, examined and evaluated the patient this AM along with Reino Bellis, NP-C.  After reviewing all the available data and chart, we discussed the patients laboratory, study & physical findings as well as symptoms in detail. I agree with her findings, examination as well as impression recommendations as per our discussion.    Attending adjustments noted in italics.   Very unusual  situation of the woman who is now had 3 episodes of anginal sounding chest pain and positive troponins.  Last 2 catheter showing relatively moderate disease in the RCA and LAD with diffuse calcification in the LAD.  Echo looks relatively normal despite poor image quality..  I agree with the potentially the best option would be to exclude any progression of disease that can be treated with PCI.  May want to consider FFR/DFR of the LAD as well as potential RCA.   Procedure: Left Heart Catheterization with Coronary Angiography  and Possible Percutaneous Coronary Intervention  The procedure with Risks/Benefits/Alternatives and Indications was reviewed with the patient .  All questions were answered.    Risks / Complications include, but not limited to: Death, MI, CVA/TIA, VF/VT (with defibrillation), Bradycardia (need for temporary pacer placement), bleeding / bruising / hematoma / pseudoaneurysm, vascular or coronary injury (with possible emergent CT or Vascular Surgery), adverse medication reactions, infection.  Additional risks involving the use of radiation with the possibility of radiation burns and cancer were explained in detail.  The patient voices understanding and agrees to proceed.      Glenetta Hew, M.D., M.S. Interventional Cardiologist   Pager # [phone] Phone # [phone] [address] [place], [place] [address]

## 2019-01-16 NOTE — Progress Notes (Signed)
Echocardiogram 2D Echocardiogram has been performed.  Oneal Deputy Krithik Mapel 01/16/2019, 10:07 AM

## 2019-01-16 NOTE — Progress Notes (Signed)
Risk of Mortality: 4.997% Renal Failure: NA Permanent Stroke: 1.288% Prolonged Ventilation: 24.252% DSW Infection: 1.635% Reoperation: 2.307% Morbidity or Mortality: 31.380% Short Length of Stay: 15.654% Long Length of Stay: 19.055%

## 2019-01-16 NOTE — Consult Note (Addendum)
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 ATRIUM           Index LA diam:        4.20 cm 1.81 cm/m  RA Area:     20.50 cm LA Vol (A2C):   56.6 ml 24.45 ml/m RA Volume:   66.70 ml  28.81 ml/m LA Vol (A4C):   40.1 ml 17.32 ml/m LA Biplane Vol: 49.5 ml 21.38 ml/m  AORTIC VALVE LVOT Vmax:   78.30 cm/s LVOT Vmean:  53.400 cm/s LVOT VTI:    0.152 m  AORTA Ao Root diam: 2.70 cm Ao Asc diam:  3.30 cm MITRAL VALVE MV Area (PHT): 2.60 cm              SHUNTS MV PHT:        84.68 msec            Systemic VTI:  0.15 m MV Decel Time: 017  msec              Systemic Diam: 2.00 cm MV E velocity: 87.80 cm/s  103 cm/s MV A velocity: 102.00 cm/s 70.3 cm/s MV E/A ratio:  0.86        1.5  Lyman Bishop MD Electronically signed by Lyman Bishop MD Signature Date/Time: 01/16/2019/10:43:37 AM    Final      I have independently reviewed the above radiologic studies and discussed  with the patient   Recent Lab Findings: Lab Results  Component Value Date   WBC 10.2 01/16/2019   HGB 12.3 01/16/2019   HCT 37.6 01/16/2019   PLT 335 01/16/2019   GLUCOSE 126 (H) 01/16/2019   CHOL 185 01/16/2019   TRIG 150 (H) 01/16/2019   HDL 39 (L) 01/16/2019   LDLDIRECT 161.6 11/28/2012   LDLCALC 116 (H) 01/16/2019   ALT 17 01/15/2019   AST 15 01/15/2019   NA 139 01/16/2019   K 5.4 (H) 01/16/2019   CL 94 (L) 01/16/2019   CREATININE 17.25 (H) 01/16/2019   BUN 77 (H) 01/16/2019   CO2 22 01/16/2019   TSH 3.798 04/18/2018   INR 1.0 05/27/2018   HGBA1C 6.6 (A) 03/07/2018   Assessment / Plan:   1. S/p NSTEMI, coronary artery disease-Dr. Kipp Brood discussed treatment options with patient. She is considering PCI with stents rather than coronary artery bypass grafting surgery, but will discuss with her family and inform us of her decision in the am. 2. ESRD-on peritoneal dialysis prior to admission. Creatinine today 17.25. Will ask nephrology to see as may need HD (not PD) if agreeable to surgery 3. History of diabetes mellitus, type II which resolved after gastric sleeve-will check HGA1C  4. History of hypertension-on Lopressor 12.5 mg bid 5. History of hyperlipidemia-on Crestor 10 mg daily     I  spent 20 minutes counseling the patient face to face.   Lars Pinks PA-C 01/16/2019 1:41 PM  Agree with above.  She has good targets for surgical bypass.  Based on her STS calculation, she has an inpatient mortality risk of 4.9.  I explained to her that she will benefit most from surgical bypass, but she would like to consider PCI/stent placement.  I have consulted nephrology to initiate hemodialysis to optimize her in case she chooses surgery.  She is tentatively scheduled for 12/30 if she agrees.    Colinda Barth Bary Leriche

## 2019-01-17 ENCOUNTER — Encounter (HOSPITAL_COMMUNITY)
Admission: EM | Disposition: A | Discharge status: Home Health | Payer: Self-pay | Source: Home / Self Care | Attending: Pulmonary Disease

## 2019-01-17 ENCOUNTER — Inpatient Hospital Stay (HOSPITAL_COMMUNITY): Payer: Medicare Other

## 2019-01-17 ENCOUNTER — Inpatient Hospital Stay (HOSPITAL_COMMUNITY): Payer: Medicare Other | Admitting: Anesthesiology

## 2019-01-17 DIAGNOSIS — I9589 Other hypotension: Secondary | ICD-10-CM

## 2019-01-17 DIAGNOSIS — R58 Hemorrhage, not elsewhere classified: Secondary | ICD-10-CM

## 2019-01-17 DIAGNOSIS — I97630 Postprocedural hematoma of a circulatory system organ or structure following a cardiac catheterization: Secondary | ICD-10-CM

## 2019-01-17 DIAGNOSIS — E861 Hypovolemia: Secondary | ICD-10-CM

## 2019-01-17 HISTORY — PX: CORONARY STENT INTERVENTION: CATH118234

## 2019-01-17 HISTORY — PX: FEMORAL ARTERY EXPLORATION: SHX5160

## 2019-01-17 LAB — HEPARIN LEVEL (UNFRACTIONATED)
Heparin Unfractionated: 0.12 IU/mL — ABNORMAL LOW (ref 0.30–0.70)
Heparin Unfractionated: 0.51 IU/mL (ref 0.30–0.70)

## 2019-01-17 LAB — CBC
HCT: 33.1 % — ABNORMAL LOW (ref 36.0–46.0)
HCT: 36.1 % (ref 36.0–46.0)
Hemoglobin: 11 g/dL — ABNORMAL LOW (ref 12.0–15.0)
Hemoglobin: 12 g/dL (ref 12.0–15.0)
MCH: 34.4 pg — ABNORMAL HIGH (ref 26.0–34.0)
MCH: 33.3 pg (ref 26.0–34.0)
MCHC: 33.2 g/dL (ref 30.0–36.0)
MCHC: 33.2 g/dL (ref 30.0–36.0)
MCV: 103.4 fL — ABNORMAL HIGH (ref 80.0–100.0)
MCV: 100.3 fL — ABNORMAL HIGH (ref 80.0–100.0)
Platelets: 310 10*3/uL (ref 150–400)
Platelets: 315 10*3/uL (ref 150–400)
RBC: 3.2 MIL/uL — ABNORMAL LOW (ref 3.87–5.11)
RBC: 3.6 MIL/uL — ABNORMAL LOW (ref 3.87–5.11)
RDW: 12.8 % (ref 11.5–15.5)
RDW: 16.8 % — ABNORMAL HIGH (ref 11.5–15.5)
WBC: 9.8 10*3/uL (ref 4.0–10.5)
WBC: 29.9 10*3/uL — ABNORMAL HIGH (ref 4.0–10.5)
nRBC: 0.2 % (ref 0.0–0.2)
nRBC: 0.3 % — ABNORMAL HIGH (ref 0.0–0.2)

## 2019-01-17 LAB — POCT I-STAT 7, (LYTES, BLD GAS, ICA,H+H)
Acid-base deficit: 13 mmol/L — ABNORMAL HIGH (ref 0.0–2.0)
Bicarbonate: 15.8 mmol/L — ABNORMAL LOW (ref 20.0–28.0)
Calcium, Ion: 0.96 mmol/L — ABNORMAL LOW (ref 1.15–1.40)
HCT: 30 % — ABNORMAL LOW (ref 36.0–46.0)
Hemoglobin: 10.2 g/dL — ABNORMAL LOW (ref 12.0–15.0)
O2 Saturation: 100 %
Patient temperature: 35.7
Potassium: 4.9 mmol/L (ref 3.5–5.1)
Sodium: 133 mmol/L — ABNORMAL LOW (ref 135–145)
TCO2: 17 mmol/L — ABNORMAL LOW (ref 22–32)
pCO2 arterial: 44.7 mmHg (ref 32.0–48.0)
pH, Arterial: 7.148 — CL (ref 7.350–7.450)
pO2, Arterial: 500 mmHg — ABNORMAL HIGH (ref 83.0–108.0)

## 2019-01-17 LAB — BASIC METABOLIC PANEL
Anion gap: 20 — ABNORMAL HIGH (ref 5–15)
BUN: 75 mg/dL — ABNORMAL HIGH (ref 6–20)
CO2: 22 mmol/L (ref 22–32)
Calcium: 7.6 mg/dL — ABNORMAL LOW (ref 8.9–10.3)
Chloride: 95 mmol/L — ABNORMAL LOW (ref 98–111)
Creatinine, Ser: 17.21 mg/dL — ABNORMAL HIGH (ref 0.44–1.00)
GFR calc Af Amer: 2 mL/min — ABNORMAL LOW (ref >60)
GFR calc non Af Amer: 2 mL/min — ABNORMAL LOW (ref >60)
Glucose, Bld: 183 mg/dL — ABNORMAL HIGH (ref 70–99)
Potassium: 5.3 mmol/L — ABNORMAL HIGH (ref 3.5–5.1)
Sodium: 137 mmol/L (ref 135–145)

## 2019-01-17 LAB — HEMOGLOBIN A1C
Hgb A1c MFr Bld: 7.2 % — ABNORMAL HIGH (ref 4.8–5.6)
Mean Plasma Glucose: 159.94 mg/dL

## 2019-01-17 LAB — CREATININE, SERUM
Creatinine, Ser: 17.28 mg/dL — ABNORMAL HIGH (ref 0.44–1.00)
GFR calc Af Amer: 2 mL/min — ABNORMAL LOW (ref >60)
GFR calc non Af Amer: 2 mL/min — ABNORMAL LOW (ref >60)

## 2019-01-17 LAB — GLUCOSE, CAPILLARY
Glucose-Capillary: 156 mg/dL — ABNORMAL HIGH (ref 70–99)
Glucose-Capillary: 124 mg/dL — ABNORMAL HIGH (ref 70–99)
Glucose-Capillary: 155 mg/dL — ABNORMAL HIGH (ref 70–99)
Glucose-Capillary: 184 mg/dL — ABNORMAL HIGH (ref 70–99)
Glucose-Capillary: 291 mg/dL — ABNORMAL HIGH (ref 70–99)

## 2019-01-17 LAB — POCT ACTIVATED CLOTTING TIME
Activated Clotting Time: 323 seconds
Activated Clotting Time: 357 seconds

## 2019-01-17 SURGERY — CORONARY STENT INTERVENTION
Anesthesia: LOCAL

## 2019-01-17 SURGERY — EXPLORATION, ARTERY, FEMORAL
Anesthesia: General | Laterality: Right

## 2019-01-17 MED ORDER — VASOPRESSIN 20 UNIT/ML IV SOLN
0.0100 [IU]/min | Freq: Once | INTRAVENOUS | Status: AC
  Administered 2019-01-17: 0.03 [IU]/min via INTRAVENOUS
  Filled 2019-01-17: qty 2

## 2019-01-17 MED ORDER — DEXAMETHASONE SODIUM PHOSPHATE 10 MG/ML IJ SOLN
INTRAMUSCULAR | Status: AC
  Filled 2019-01-17: qty 1

## 2019-01-17 MED ORDER — VASOPRESSIN 20 UNIT/ML IV SOLN: INTRAVENOUS | Status: DC | PRN

## 2019-01-17 MED ORDER — HEPARIN SODIUM (PORCINE) 1000 UNIT/ML IJ SOLN
INTRAMUSCULAR | Status: AC
  Filled 2019-01-17: qty 2

## 2019-01-17 MED ORDER — VASOPRESSIN 20 UNIT/ML IV SOLN
INTRAVENOUS | Status: AC
  Filled 2019-01-17: qty 1

## 2019-01-17 MED ORDER — SODIUM CHLORIDE (PF) 0.9 % IJ SOLN
INTRAMUSCULAR | Status: AC
  Filled 2019-01-17: qty 10

## 2019-01-17 MED ORDER — HEPARIN SODIUM (PORCINE) 5000 UNIT/ML IJ SOLN
5000.0000 [IU] | Freq: Three times a day (TID) | INTRAMUSCULAR | Status: DC
  Administered 2019-01-18 – 2019-01-23 (×16): 5000 [IU] via SUBCUTANEOUS
  Filled 2019-01-17 (×16): qty 1

## 2019-01-17 MED ORDER — SODIUM CHLORIDE 0.9 % IV SOLN: 250.0000 mL | INTRAVENOUS | Status: DC | PRN

## 2019-01-17 MED ORDER — DEXAMETHASONE SODIUM PHOSPHATE 10 MG/ML IJ SOLN
INTRAMUSCULAR | Status: DC | PRN
  Administered 2019-01-17: 10 mg via INTRAVENOUS

## 2019-01-17 MED ORDER — CHLORHEXIDINE GLUCONATE CLOTH 2 % EX PADS
6.0000 | MEDICATED_PAD | Freq: Every day | CUTANEOUS | Status: DC
  Administered 2019-01-17 – 2019-01-23 (×7): 6 via TOPICAL

## 2019-01-17 MED ORDER — LIDOCAINE HCL (PF) 1 % IJ SOLN
INTRAMUSCULAR | Status: DC | PRN
  Administered 2019-01-17: 15 mL via INTRADERMAL

## 2019-01-17 MED ORDER — SODIUM CHLORIDE 0.9% FLUSH: 3.0000 mL | INTRAVENOUS | Status: DC | PRN

## 2019-01-17 MED ORDER — TICAGRELOR 90 MG PO TABS
90.0000 mg | ORAL_TABLET | Freq: Two times a day (BID) | ORAL | Status: DC
  Administered 2019-01-17 – 2019-01-23 (×12): 90 mg via ORAL
  Filled 2019-01-17 (×12): qty 1

## 2019-01-17 MED ORDER — NOREPINEPHRINE 16 MG/250ML-% IV SOLN
0.0000 ug/min | INTRAVENOUS | Status: DC
  Administered 2019-01-17: 5 ug/min via INTRAVENOUS
  Administered 2019-01-17: 30 ug/min via INTRAVENOUS
  Administered 2019-01-17: 20 ug/min via INTRAVENOUS
  Administered 2019-01-18: 34 ug/min via INTRAVENOUS
  Filled 2019-01-17 (×2): qty 250

## 2019-01-17 MED ORDER — MIDAZOLAM HCL 2 MG/2ML IJ SOLN
INTRAMUSCULAR | Status: AC
  Filled 2019-01-17: qty 2

## 2019-01-17 MED ORDER — ORAL CARE MOUTH RINSE
15.0000 mL | Freq: Two times a day (BID) | OROMUCOSAL | Status: DC
  Administered 2019-01-17 – 2019-01-23 (×10): 15 mL via OROMUCOSAL

## 2019-01-17 MED ORDER — SUGAMMADEX SODIUM 200 MG/2ML IV SOLN
INTRAVENOUS | Status: DC | PRN
  Administered 2019-01-17: 200 mg via INTRAVENOUS

## 2019-01-17 MED ORDER — OXYCODONE HCL 5 MG PO TABS: 5.0000 mg | ORAL_TABLET | Freq: Once | ORAL | Status: DC | PRN

## 2019-01-17 MED ORDER — LIDOCAINE 2% (20 MG/ML) 5 ML SYRINGE
INTRAMUSCULAR | Status: DC | PRN
  Administered 2019-01-17: 80 mg via INTRAVENOUS

## 2019-01-17 MED ORDER — ONDANSETRON HCL 4 MG/2ML IJ SOLN
INTRAMUSCULAR | Status: AC
  Filled 2019-01-17: qty 2

## 2019-01-17 MED ORDER — IOHEXOL 350 MG/ML SOLN
INTRAVENOUS | Status: DC | PRN
  Administered 2019-01-17: 100 mL via INTRACARDIAC

## 2019-01-17 MED ORDER — PROMETHAZINE HCL 25 MG/ML IJ SOLN: 6.2500 mg | INTRAMUSCULAR | Status: DC | PRN

## 2019-01-17 MED ORDER — MIDAZOLAM HCL 2 MG/2ML IJ SOLN
INTRAMUSCULAR | Status: DC | PRN
  Administered 2019-01-17 (×2): 1 mg via INTRAVENOUS

## 2019-01-17 MED ORDER — PHENYLEPHRINE HCL (PRESSORS) 10 MG/ML IV SOLN
INTRAVENOUS | Status: DC | PRN
  Administered 2019-01-17: 80 ug via INTRAVENOUS
  Administered 2019-01-17: 200 ug via INTRAVENOUS

## 2019-01-17 MED ORDER — ONDANSETRON HCL 4 MG/2ML IJ SOLN: 4.0000 mg | Freq: Four times a day (QID) | INTRAMUSCULAR | Status: DC | PRN

## 2019-01-17 MED ORDER — SODIUM CHLORIDE 0.9 % IV SOLN
INTRAVENOUS | Status: AC
  Filled 2019-01-17: qty 1.2

## 2019-01-17 MED ORDER — LIDOCAINE HCL (PF) 1 % IJ SOLN
INTRAMUSCULAR | Status: AC
  Filled 2019-01-17: qty 30

## 2019-01-17 MED ORDER — HYDRALAZINE HCL 20 MG/ML IJ SOLN: 10.0000 mg | INTRAMUSCULAR | Status: DC | PRN

## 2019-01-17 MED ORDER — CALCIUM CHLORIDE 10 % IV SOLN
INTRAVENOUS | Status: DC | PRN
  Administered 2019-01-17: .5 g via INTRAVENOUS

## 2019-01-17 MED ORDER — HEPARIN (PORCINE) IN NACL 1000-0.9 UT/500ML-% IV SOLN
INTRAVENOUS | Status: DC | PRN
  Administered 2019-01-17 (×2): 500 mL

## 2019-01-17 MED ORDER — VASOPRESSIN 20 UNIT/ML IV SOLN
0.0100 [IU]/min | INTRAVENOUS | Status: DC
  Administered 2019-01-17: 0.03 [IU]/min via INTRAVENOUS
  Filled 2019-01-17 (×2): qty 2

## 2019-01-17 MED ORDER — EPINEPHRINE PF 1 MG/ML IJ SOLN
INTRAMUSCULAR | Status: DC | PRN
  Administered 2019-01-17: .1 mg via INTRAVENOUS

## 2019-01-17 MED ORDER — ROCURONIUM BROMIDE 10 MG/ML (PF) SYRINGE
PREFILLED_SYRINGE | INTRAVENOUS | Status: AC
  Filled 2019-01-17: qty 10

## 2019-01-17 MED ORDER — 0.9 % SODIUM CHLORIDE (POUR BTL) OPTIME
TOPICAL | Status: DC | PRN
  Administered 2019-01-17: 2000 mL

## 2019-01-17 MED ORDER — HEPARIN (PORCINE) IN NACL 1000-0.9 UT/500ML-% IV SOLN
INTRAVENOUS | Status: AC
  Filled 2019-01-17: qty 1000

## 2019-01-17 MED ORDER — ROCURONIUM 10MG/ML (10ML) SYRINGE FOR MEDFUSION PUMP - OPTIME
INTRAVENOUS | Status: DC | PRN
  Administered 2019-01-17: 60 mg via INTRAVENOUS

## 2019-01-17 MED ORDER — DOPAMINE-DEXTROSE 3.2-5 MG/ML-% IV SOLN
INTRAVENOUS | Status: AC
  Filled 2019-01-17: qty 250

## 2019-01-17 MED ORDER — PHENYLEPHRINE 40 MCG/ML (10ML) SYRINGE FOR IV PUSH (FOR BLOOD PRESSURE SUPPORT)
PREFILLED_SYRINGE | INTRAVENOUS | Status: AC
  Filled 2019-01-17: qty 10

## 2019-01-17 MED ORDER — ONDANSETRON HCL 4 MG/2ML IJ SOLN
INTRAMUSCULAR | Status: DC | PRN
  Administered 2019-01-17: 4 mg via INTRAVENOUS

## 2019-01-17 MED ORDER — NOREPINEPHRINE 4 MG/250ML-% IV SOLN
0.0000 ug/min | INTRAVENOUS | Status: DC
  Filled 2019-01-17: qty 250

## 2019-01-17 MED ORDER — SODIUM CHLORIDE 0.9 % IV SOLN
INTRAVENOUS | Status: DC | PRN
  Administered 2019-01-17: 500 mL

## 2019-01-17 MED ORDER — OXYCODONE HCL 5 MG/5ML PO SOLN: 5.0000 mg | Freq: Once | ORAL | Status: DC | PRN

## 2019-01-17 MED ORDER — SUFENTANIL CITRATE 50 MCG/ML IV SOLN
INTRAVENOUS | Status: DC | PRN
  Administered 2019-01-17: 5 ug via INTRAVENOUS
  Administered 2019-01-17: 15 ug via INTRAVENOUS

## 2019-01-17 MED ORDER — VASOPRESSIN 20 UNIT/ML IV SOLN
INTRAVENOUS | Status: DC | PRN
  Administered 2019-01-17 (×2): 2 m[IU] via INTRAVENOUS

## 2019-01-17 MED ORDER — PROPOFOL 10 MG/ML IV BOLUS
INTRAVENOUS | Status: DC | PRN
  Administered 2019-01-17: 50 mg via INTRAVENOUS

## 2019-01-17 MED ORDER — SUFENTANIL CITRATE 50 MCG/ML IV SOLN
INTRAVENOUS | Status: AC
  Filled 2019-01-17: qty 1

## 2019-01-17 MED ORDER — NITROGLYCERIN 1 MG/10 ML FOR IR/CATH LAB
INTRA_ARTERIAL | Status: DC | PRN
  Administered 2019-01-17 (×2): 200 ug via INTRACORONARY

## 2019-01-17 MED ORDER — SODIUM CHLORIDE 0.9 % IV SOLN: INTRAVENOUS | Status: DC | PRN

## 2019-01-17 MED ORDER — MIDAZOLAM HCL 2 MG/2ML IJ SOLN
INTRAMUSCULAR | Status: DC | PRN
  Administered 2019-01-17: 2 mg via INTRAVENOUS

## 2019-01-17 MED ORDER — TICAGRELOR 90 MG PO TABS
180.0000 mg | ORAL_TABLET | Freq: Once | ORAL | Status: AC
  Administered 2019-01-17: 180 mg via ORAL
  Filled 2019-01-17: qty 2

## 2019-01-17 MED ORDER — SODIUM CHLORIDE 0.9% FLUSH
3.0000 mL | Freq: Two times a day (BID) | INTRAVENOUS | Status: DC
  Administered 2019-01-17: 3 mL via INTRAVENOUS

## 2019-01-17 MED ORDER — FENTANYL CITRATE (PF) 100 MCG/2ML IJ SOLN
INTRAMUSCULAR | Status: DC | PRN
  Administered 2019-01-17 (×4): 25 ug via INTRAVENOUS

## 2019-01-17 MED ORDER — FENTANYL CITRATE (PF) 100 MCG/2ML IJ SOLN: 25.0000 ug | INTRAMUSCULAR | Status: DC | PRN

## 2019-01-17 MED ORDER — HEPARIN SODIUM (PORCINE) 1000 UNIT/ML IJ SOLN
INTRAMUSCULAR | Status: DC | PRN
  Administered 2019-01-17: 2000 [IU] via INTRAVENOUS
  Administered 2019-01-17: 12000 [IU] via INTRAVENOUS

## 2019-01-17 MED ORDER — LABETALOL HCL 5 MG/ML IV SOLN: 10.0000 mg | INTRAVENOUS | Status: DC | PRN

## 2019-01-17 MED ORDER — NITROGLYCERIN 1 MG/10 ML FOR IR/CATH LAB
INTRA_ARTERIAL | Status: AC
  Filled 2019-01-17: qty 10

## 2019-01-17 MED ORDER — SODIUM CHLORIDE 0.9% FLUSH: 3.0000 mL | Freq: Two times a day (BID) | INTRAVENOUS | Status: DC

## 2019-01-17 MED ORDER — ASPIRIN 81 MG PO CHEW: 81.0000 mg | CHEWABLE_TABLET | Freq: Every day | ORAL | Status: DC

## 2019-01-17 MED ORDER — FENTANYL CITRATE (PF) 100 MCG/2ML IJ SOLN
INTRAMUSCULAR | Status: AC
  Filled 2019-01-17: qty 2

## 2019-01-17 MED ORDER — SODIUM CHLORIDE 0.9 % IV SOLN: INTRAVENOUS | Status: DC

## 2019-01-17 MED ORDER — ACETAMINOPHEN 325 MG PO TABS
650.0000 mg | ORAL_TABLET | ORAL | Status: DC | PRN
  Administered 2019-01-18 (×2): 650 mg via ORAL

## 2019-01-17 SURGICAL SUPPLY — 24 items
BALLN SAPPHIRE 2.0X12 (BALLOONS) ×2
BALLN SAPPHIRE 2.75X20 (BALLOONS) ×2
BALLN SAPPHIRE ~~LOC~~ 2.75X12 (BALLOONS) ×1 IMPLANT
BALLN SAPPHIRE ~~LOC~~ 3.5X18 (BALLOONS) ×1 IMPLANT
BALLOON SAPPHIRE 2.0X12 (BALLOONS) IMPLANT
BALLOON SAPPHIRE 2.75X20 (BALLOONS) IMPLANT
CATH LAUNCHER 6FR EBU 4 (CATHETERS) ×1 IMPLANT
CATH LAUNCHER 6FR JR4 (CATHETERS) ×1 IMPLANT
HOVERMATT SINGLE USE (MISCELLANEOUS) ×1 IMPLANT
KIT ENCORE 26 ADVANTAGE (KITS) ×1 IMPLANT
KIT HEART LEFT (KITS) ×2 IMPLANT
KIT HEMO VALVE WATCHDOG (MISCELLANEOUS) ×1 IMPLANT
PACK CARDIAC CATHETERIZATION (CUSTOM PROCEDURE TRAY) ×2 IMPLANT
SHEATH PINNACLE 6F 10CM (SHEATH) ×1 IMPLANT
STENT SYNERGY XD 2.25X24 (Permanent Stent) IMPLANT
STENT SYNERGY XD 3.0X38 (Permanent Stent) IMPLANT
SYNERGY XD 2.25X24 (Permanent Stent) ×2 IMPLANT
SYNERGY XD 3.0X38 (Permanent Stent) ×2 IMPLANT
TRANSDUCER W/STOPCOCK (MISCELLANEOUS) ×2 IMPLANT
TUBING CIL FLEX 10 FLL-RA (TUBING) ×2 IMPLANT
WIRE ASAHI PROWATER 180CM (WIRE) ×1 IMPLANT
WIRE EMERALD 3MM-J .035X150CM (WIRE) ×1 IMPLANT
WIRE FIGHTER CROSSING 190CM (WIRE) ×1 IMPLANT
WIRE HI TORQ BMW 190CM (WIRE) ×1 IMPLANT

## 2019-01-17 SURGICAL SUPPLY — 52 items
ADH SKN CLS APL DERMABOND .7 (GAUZE/BANDAGES/DRESSINGS)
BNDG ELASTIC 4X5.8 VLCR STR LF (GAUZE/BANDAGES/DRESSINGS) IMPLANT
CANISTER SUCT 3000ML PPV (MISCELLANEOUS) ×2 IMPLANT
CLIP VESOCCLUDE MED 24/CT (CLIP) ×2 IMPLANT
CLIP VESOCCLUDE SM WIDE 24/CT (CLIP) ×2 IMPLANT
COVER WAND RF STERILE (DRAPES) ×1 IMPLANT
DERMABOND ADVANCED (GAUZE/BANDAGES/DRESSINGS)
DERMABOND ADVANCED .7 DNX12 (GAUZE/BANDAGES/DRESSINGS) ×1 IMPLANT
DRAIN CHANNEL 15F RND FF W/TCR (WOUND CARE) IMPLANT
DRAPE X-RAY CASS 24X20 (DRAPES) IMPLANT
DRSG COVADERM 4X6 (GAUZE/BANDAGES/DRESSINGS) ×1 IMPLANT
ELECT REM PT RETURN 9FT ADLT (ELECTROSURGICAL) ×2
ELECTRODE REM PT RTRN 9FT ADLT (ELECTROSURGICAL) ×1 IMPLANT
EVACUATOR SILICONE 100CC (DRAIN) IMPLANT
GLOVE BIO SURGEON STRL SZ7.5 (GLOVE) ×2 IMPLANT
GLOVE BIOGEL PI IND STRL 6.5 (GLOVE) IMPLANT
GLOVE BIOGEL PI IND STRL 7.0 (GLOVE) IMPLANT
GLOVE BIOGEL PI IND STRL 7.5 (GLOVE) ×3 IMPLANT
GLOVE BIOGEL PI INDICATOR 6.5 (GLOVE) ×3
GLOVE BIOGEL PI INDICATOR 7.0 (GLOVE) ×1
GLOVE BIOGEL PI INDICATOR 7.5 (GLOVE) ×3
GLOVE SURG SS PI 7.5 STRL IVOR (GLOVE) ×2 IMPLANT
GOWN STRL REUS W/ TWL LRG LVL3 (GOWN DISPOSABLE) ×2 IMPLANT
GOWN STRL REUS W/ TWL XL LVL3 (GOWN DISPOSABLE) ×1 IMPLANT
GOWN STRL REUS W/TWL LRG LVL3 (GOWN DISPOSABLE) ×6
GOWN STRL REUS W/TWL XL LVL3 (GOWN DISPOSABLE) ×2
HEMOSTAT SNOW SURGICEL 2X4 (HEMOSTASIS) IMPLANT
KIT BASIN OR (CUSTOM PROCEDURE TRAY) ×2 IMPLANT
KIT TURNOVER KIT B (KITS) ×2 IMPLANT
MARKER GRAFT CORONARY BYPASS (MISCELLANEOUS) IMPLANT
NS IRRIG 1000ML POUR BTL (IV SOLUTION) ×4 IMPLANT
PACK PERIPHERAL VASCULAR (CUSTOM PROCEDURE TRAY) ×2 IMPLANT
PAD ARMBOARD 7.5X6 YLW CONV (MISCELLANEOUS) ×4 IMPLANT
SET COLLECT BLD 21X3/4 12 (NEEDLE) IMPLANT
STAPLER VISISTAT 35W (STAPLE) ×1 IMPLANT
STOPCOCK 4 WAY LG BORE MALE ST (IV SETS) IMPLANT
SUT ETHILON 3 0 PS 1 (SUTURE) IMPLANT
SUT PROLENE 5 0 C 1 24 (SUTURE) ×4 IMPLANT
SUT PROLENE 6 0 BV (SUTURE) ×4 IMPLANT
SUT SILK 3 0 (SUTURE)
SUT SILK 3-0 18XBRD TIE 12 (SUTURE) IMPLANT
SUT VIC AB 2-0 CT1 27 (SUTURE) ×2
SUT VIC AB 2-0 CT1 TAPERPNT 27 (SUTURE) ×1 IMPLANT
SUT VIC AB 3-0 SH 27 (SUTURE) ×2
SUT VIC AB 3-0 SH 27X BRD (SUTURE) ×2 IMPLANT
SUT VICRYL 4-0 PS2 18IN ABS (SUTURE) ×3 IMPLANT
TOWEL GREEN STERILE (TOWEL DISPOSABLE) ×2 IMPLANT
TRAY FOLEY MTR SLVR 16FR STAT (SET/KITS/TRAYS/PACK) IMPLANT
TRAY FOLEY W/BAG SLVR 14FR (SET/KITS/TRAYS/PACK) ×1 IMPLANT
TUBING EXTENTION W/L.L. (IV SETS) IMPLANT
UNDERPAD 30X30 (UNDERPADS AND DIAPERS) ×2 IMPLANT
WATER STERILE IRR 1000ML POUR (IV SOLUTION) ×2 IMPLANT

## 2019-01-17 NOTE — Progress Notes (Signed)
Called to get report for PD per CN Lourdes Sledge patient will be going to OR and will be on 2H thereafter...alerted MD Johnney Ou.

## 2019-01-17 NOTE — Consult Note (Addendum)
[facility] NOTE   MRN : [id number]  Reason for Consult: Retroperitoneal bleed after cardiac cath. Referring Physician: Dr. Lendell Caprice  History of Present Illness: 53 y/o female s/p cardiac catheterization using  a 56F sheath was placed in the right femoral artery with a single anterior needle wall stick.   She became dizzy with hypotension HR 150's.  Ct scan performed shows abnormal fluid/blood on the right retro peroneal area.   She is currently being supported on Levophed.  Recent episodes of CP.  ESRD on PD, Hypercholesterolemia, Hyperkalemia, DM.  Patient declined CABG.  WIil plan for PCI of RCA and will likely attempt PCI of OM.        Current Facility-Administered Medications  Medication Dose Route Frequency Provider Last Rate Last Admin  . 0.9 %  sodium chloride infusion  250 mL Intravenous PRN Jettie Booze, MD      . acetaminophen (TYLENOL) tablet 650 mg  650 mg Oral Q4H PRN Jettie Booze, MD   650 mg at 01/16/19 2257  . aspirin EC tablet 81 mg  81 mg Oral Daily Jettie Booze, MD   81 mg at 01/17/19 0825  . calcium acetate (PHOSLO) capsule 667 mg  667 mg Oral TID WC Jettie Booze, MD   667 mg at 01/17/19 6226  . cinacalcet (SENSIPAR) tablet 90 mg  90 mg Oral Q supper Jettie Booze, MD      . dialysis solution 1.5% low-MG/low-CA dianeal solution   Intraperitoneal Q24H Roney Jaffe, MD      . dialysis solution 2.5% low-MG/low-CA dianeal solution   Intraperitoneal Q24H Roney Jaffe, MD      . DOPamine (INTROPIN) 3.2-5 MG/ML-% infusion           . fentaNYL (SUBLIMAZE) injection 25 mcg  25 mcg Intravenous Q2H PRN Jettie Booze, MD   25 mcg at 01/17/19 0007  . ferric citrate (AURYXIA) tablet 210 mg  210 mg Oral TID WC Jettie Booze, MD   210 mg at 01/17/19 3335  . folic acid (FOLVITE) tablet 1 mg  1 mg Oral Daily Jettie Booze, MD   1 mg at 01/17/19 4562  . gabapentin (NEURONTIN) capsule  100 mg  100 mg Oral BID Jettie Booze, MD   100 mg at 01/17/19 5638  . gentamicin cream (GARAMYCIN) 0.1 % 1 application  1 application Topical PRN Jettie Booze, MD      . gentamicin cream (GARAMYCIN) 0.1 % 1 application  1 application Topical Daily Roney Jaffe, MD   1 application at 93/73/42 1800  . heparin 2,500 Units in dialysis solution 1.5% low-MG/low-CA 5,000 mL dialysis solution   Peritoneal Dialysis PRN Kc, Maren [place], MD      . heparin 2,500 Units in dialysis solution 2.5% low-MG/low-CA 5,000 mL dialysis solution   Peritoneal Dialysis PRN Kc, Ramesh, MD      . heparin ADULT infusion 100 units/mL (25000 units/224m sodium chloride 0.45%)  1,250 Units/hr Intravenous Continuous AFranky Macho RPH 12.5 mL/hr at 01/17/19 0823 1,250 Units/hr at 01/17/19 0823  . insulin aspart (novoLOG) injection 0-5 Units  0-5 Units Subcutaneous QHS Kc, Ramesh, MD      . insulin aspart (novoLOG) injection 0-9 Units  0-9 Units Subcutaneous TID WC KAntonieta Pert MD   2 Units at 01/17/19 0825  . metoprolol tartrate (LOPRESSOR) tablet 12.5 mg  12.5 mg Oral BID VJettie Booze MD   12.5 mg at 01/17/19 08768 .  nitroGLYCERIN (NITROGLYN) 2 % ointment 0.5 inch  0.5 inch Topical Q6H Jettie Booze, MD   0.5 inch at 01/17/19 1233  . nitroGLYCERIN (NITROSTAT) SL tablet 0.4 mg  0.4 mg Sublingual Q5 Min x 3 PRN Jettie Booze, MD   0.4 mg at 01/16/19 1920  . norepinephrine (LEVOPHED) 52m in 2521mpremix infusion  0-40 mcg/min Intravenous Titrated VaJettie BoozeMD      . ondansetron ([facility] Countyinjection 4 mg  4 mg Intravenous Q6H PRN VaJettie BoozeMD      . rosuvastatin (CRESTOR) tablet 10 mg  10 mg Oral Daily VaJettie BoozeMD   10 mg at 01/17/19 081914. sodium chloride flush (NS) 0.9 % injection 3 mL  3 mL Intravenous Once VaLarae Grooms, MD      . sodium chloride flush (NS) 0.9 % injection 3 mL  3 mL Intravenous Q12H VaJettie BoozeMD   3 mL at 01/17/19 0826  .  sodium chloride flush (NS) 0.9 % injection 3 mL  3 mL Intravenous PRN VaJettie BoozeMD        Pt meds include: Statin :Yes Betablocker: YES ASA: Yes Other anticoagulants/antiplatelets: none  Past Medical History:  Diagnosis Date  . Anemia   . CAD (coronary artery disease)    a. cath in 02/2018 showing moderate 3-vessel CAD with 45% mid-LADm 55% LCx and 65% RCA stenosis which was not significant by FFR  . ESRD on dialysis ([facility]  . Gout   . HCAP (healthcare-associated pneumonia) 05/06/2016  . History of blood transfusion    "related to surgery"  . Hyperlipidemia   . Hypertension   . Morbid obesity ([facility]  . Pain    LEFT SHOULDER PAIN - WAS SEEN AT AN URGENT CARE - GIVEN SLING FOR COMFORT AND TOLD ROM AS TOLERATED.  . Marland Kitchenalpitations 09/24/2016  . Peritonitis, dialysis-associated ([facility]  . Type II diabetes mellitus ([facility]   "gastric sleeve OR corrected this" (05/06/2016)    Past Surgical History:  Procedure Laterality Date  . AV FISTULA PLACEMENT Left 01/03/2015   Procedure: BRACHIAL CEPHALIC ARTERIOVENOUS  FISTULA CREATION LEFT ARM;  Surgeon: ChAngelia MouldMD;  [facility] Service: [facility];  Laterality: Left;  . CARPAL TUNNEL RELEASE Right   . [place]. CHOLECYSTECTOMY  09/14/2017  . ESOPHAGOGASTRODUODENOSCOPY N/A 09/04/2012   Procedure: ESOPHAGOGASTRODUODENOSCOPY (EGD);  Surgeon: DaShann MedalMD;  [facility];  Service: General;  Laterality: N/A;  PF  . ESOPHAGOGASTRODUODENOSCOPY (EGD) WITH ESOPHAGEAL DILATION N/A 09/29/2012   Procedure: ESOPHAGOGASTRODUODENOSCOPY (EGD) WITH ESOPHAGEAL DILATION;  Surgeon: DaMilus BanisterMD;  [facility];  Service: Endoscopy;  Laterality: N/A;  . INSERTION OF DIALYSIS CATHETER N/A 01/03/2015   Procedure: INSERTION OF DIALYSIS CATHETER RIGHT INTERNAL JUGULAR;  Surgeon: ChAngelia MouldMD;  Location: [facility] Service: Vascular;  Laterality: N/A;  . LAPAROSCOPIC GASTRIC  SLEEVE RESECTION N/A 07/19/2012   Procedure: LAPAROSCOPIC SLEEVE GASTRECTOMY with EGD;  Surgeon: BrMadilyn HookDO;  [facility];  Service: General;  Laterality: N/A;  laparoscopic sleeve gastrectomy with EGD  . LEFT HEART CATH AND CORONARY ANGIOGRAPHY N/A 02/25/2018   Procedure: LEFT HEART CATH AND CORONARY ANGIOGRAPHY;  Surgeon: HaLeonie ManMD;  [facility];  Service: Cardiovascular;  Laterality: N/A;  . LEFT HEART CATH AND CORONARY ANGIOGRAPHY N/A 06/20/2018   Procedure: LEFT HEART CATH AND CORONARY ANGIOGRAPHY;  Surgeon:  End, Harrell Gave, MD;  Location: [place] [facility] LAB;  Service: Cardiovascular;  Laterality: N/A;  . LEFT HEART CATH AND CORONARY ANGIOGRAPHY N/A 01/16/2019   Procedure: LEFT HEART CATH AND CORONARY ANGIOGRAPHY;  Surgeon: Jettie Booze, MD;  Location: [place] [facility] LAB;  Service: Cardiovascular;  Laterality: N/A;  . PERIPHERAL VASCULAR CATHETERIZATION Left 05/23/2015   Procedure: Nolon Stalls;  Surgeon: Conrad Hays, MD;  Location: [place] [facility] LAB;  Service: Cardiovascular;  Laterality: Left;  upper aRM  . PERIPHERAL VASCULAR CATHETERIZATION Left 05/23/2015   Procedure: Peripheral Vascular Balloon Angioplasty;  Surgeon: Conrad Sumner, MD;  Location: [place] [facility];  Service: Cardiovascular;  Laterality: Left;  av fistula  . TUBAL LIGATION  1993  . UPPER GI ENDOSCOPY N/A 07/19/2012   Procedure: UPPER GI ENDOSCOPY;  Surgeon: Madilyn Hook, DO;  [facility];  Service: General;  Laterality: N/A;    Social History Social History   Tobacco Use  . Smoking status: Former Smoker    Packs/day: 1.00    Years: 16.00    Pack years: 16.00    Types: Cigarettes    Quit date: 2009    Years since quitting: 12.0  . Smokeless tobacco: Never Used  Substance Use Topics  . Alcohol use: Never  . Drug use: No    Family History Family History  Problem Relation Age of Onset  . Hypertension Mother   . Mitral valve prolapse Mother   . Diabetes Father    . Arthritis Other   . Hyperlipidemia Other   . Cancer Neg Hx   . Heart disease Neg Hx   . Kidney disease Neg Hx   . Stroke Neg Hx     Allergies  Allergen Reactions  . Amlodipine Swelling  . Atorvastatin     Elevated LFT's  . Clonidine Derivatives Swelling    Limbs swell  . Doxycycline Nausea And Vomiting    "I threw up for 3 hours"  . Welchol [Colesevelam Hcl] Nausea Only     REVIEW OF SYSTEMS  General: [ ]  Weight loss, [ ]  Fever, [ ]  chills Neurologic: [ ]  Dizziness, [ ]  Blackouts, [ ]  Seizure [ ]  Stroke, [ ]  "Mini stroke", [ ]  Slurred speech, [ ]  Temporary blindness; [ ]  weakness in arms or legs, [ ]  Hoarseness [ ]  Dysphagia Cardiac: [ ]  Chest pain/pressure, [ ]  Shortness of breath at rest [ ]  Shortness of breath with exertion, [ ]  Atrial fibrillation or irregular heartbeat  Vascular: [ ]  Pain in legs with walking, [ ]  Pain in legs at rest, [ ]  Pain in legs at night,  [ ]  Non-healing ulcer, [ ]  Blood clot in vein/DVT,   Pulmonary: [ ]  Home oxygen, [ ]  Productive cough, [ ]  Coughing up blood, [ ]  Asthma,  [ ]  Wheezing [ ]  COPD Musculoskeletal:  [ ]  Arthritis, [ ]  Low back pain, [ ]  Joint pain Hematologic: [ ]  Easy Bruising, [ ]  Anemia; [ ]  Hepatitis Gastrointestinal: [ ]  Blood in stool, [ ]  Gastroesophageal Reflux/heartburn, Urinary: [ ]  chronic Kidney disease, [ ]  on HD - [ ]  MWF or [ ]  TTHS, [ ]  Burning with urination, [ ]  Difficulty urinating Skin: [ ]  Rashes, [ ]  Wounds Psychological: [ ]  Anxiety, [ ]  Depression  Physical Examination Vitals:   01/17/19 1628 01/17/19 1633 01/17/19 1638 01/17/19 1756  BP: (!) 150/86 133/78 127/81 (!) 94/30  Pulse: 79 72 77 72  Resp: (!) 28 17 14  (!) 23  Temp:  TempSrc:      SpO2: 100% 100% 100% 100%  Weight:      Height:       Body mass index is 48.65 kg/m.  General:  Responsive head nods, but not talking HENT: WNL Eyes: Pupils equal Pulmonary: normal non-labored breathing , without Rales, rhonchi,  wheezing Cardiac:  Tachycardia , without  Murmurs, rubs or gallops; No carotid bruits Abdomen: soft, NT, no masses Skin: no rashes, ulcers noted;  no Gangrene , no cellulitis; no open wounds;   Vascular Exam/Pulses:Palpable DP pulses B, right groin soft with intact cardiac sheath.    Musculoskeletal: no muscle wasting or atrophy; no edema  Neurologic: A&O X 3; Appropriate Affect ;  SENSATION: normal; MOTOR FUNCTION: 5/5 Symmetric Speech is fluent/normal   Significant Diagnostic Studies: CBC Lab Results  Component Value Date   WBC 9.8 01/17/2019   HGB 11.0 (L) 01/17/2019   HCT 33.1 (L) 01/17/2019   MCV 103.4 (H) 01/17/2019   PLT 310 01/17/2019    BMET    Component Value Date/Time   NA 137 01/17/2019 0404   NA 141 12/26/2018 1022   K 5.3 (H) 01/17/2019 0404   CL 95 (L) 01/17/2019 0404   CO2 22 01/17/2019 0404   GLUCOSE 183 (H) 01/17/2019 0404   BUN 75 (H) 01/17/2019 0404   BUN 64 (H) 12/26/2018 1022   CREATININE 17.21 (H) 01/17/2019 0404   CREATININE 2.28 (H) 03/21/2012 0952   CALCIUM 7.6 (L) 01/17/2019 0404   GFRNONAA 2 (L) 01/17/2019 0404   GFRAA 2 (L) 01/17/2019 0404   Estimated Creatinine Clearance: 5.1 mL/min (A) (by C-G formula based on SCr of 17.21 mg/dL (H)).  COAG Lab Results  Component Value Date   INR 1.0 05/27/2018   INR 1.3 (H) 04/21/2018   INR 1.0 11/26/2017   Cath: 01/16/19   Mid LAD lesion is 75% stenosed. The LAD is diffusely diseased and heavily calcified.  Mid Cx to Dist Cx lesion is 55% stenosed with 40% stenosed side branch in Ost 3rd Mrg.  Prox RCA lesion is 75% stenosed. This disease appears to have progressed.  Ost 1st Mrg-1 lesion is 50% stenosed.  Ost 1st Mrg-2 lesion is 99% stenosed. This lesion is significantly worse than prior.  The left ventricular systolic function is normal.  LV end diastolic pressure is normal.  The left ventricular ejection fraction is 55-65% by visual estimate.  There is no aortic valve stenosis.  Ost LAD to Mid  LAD lesion is 40% stenosed.  Severe multivessel disease with progression of calcific disease in the mid LAD, proximal RCA and tortuous mid OM1.   CT Abdomin and Pelvis without contrast. IMPRESSION: 1. Large right-sided retroperitoneal hematoma measuring approximately 10.0 x 15.4 x 24.9 cm. 2. Right groin arterial sheath with the tip adjacent to but not clearly within the junction of the right external iliac and common femoral arteries. 3. Subtle nodular contour of the left hepatic lobe again noted, suspicious for cirrhosis. 4. Tiny foci of pneumoperitoneum again noted, likely related to peritoneal dialysis. 5.  Aortic atherosclerosis (ICD10-I70.0). ASSESSMENT/PLAN:  Symptomatic Large right-sided retroperitoneal hematoma measuring approximately 10.0 x 15.4 x 24.9 cm.  Right groin soft with arterial sheath in groin.    Plan for emergent OR to explore right common femoral/illiac arteries.  Roxy Horseman 01/17/2019 6:06 PM  I have independently interviewed and examined patient and agree with PA assessment and plan above.  Patient hypotensive with significant right retroperitoneal hematoma.  I discussed with patient as  well as husband proceeding to the operating room for open exploration primary repair of arteriotomy.  They demonstrate good understanding.  Specifically discussed risks with her three-vessel coronary disease, risk of requiring blood products, and likelihood of wound issues in the future.  Jenell Dobransky C. Donzetta Matters, MD [facility] [place] Office: [phone] Pager: [phone]

## 2019-01-17 NOTE — Progress Notes (Addendum)
[facility] Progress Note   Subjective:  Completed PD overnight without issue. Denies CP, SOB at present. Plan for PCI in cath lab this afternoon.   Objective Vitals:   01/17/19 0624 01/17/19 0735 01/17/19 0823 01/17/19 0825  BP: (!) 89/53 105/65 105/65   Pulse: 72 75  83  Resp:  20    Temp: 98.1 F (36.7 C) 98 F (36.7 C)    TempSrc: Oral Oral    SpO2: 99% 100%  98%  Weight:  132.6 kg    Height:        Physical Exam General: Alert, comfortable in bed, nad  Heart: Regular  Lungs: Clear bilaterally  Abdomen: obese, soft non-tender, PD cath in place  Extremities: No LE edema  Dialysis Access: LUE AVF +bruit   Weight change: 7.8 kg   Additional Objective Labs: Basic Metabolic Panel: Recent Labs  Lab 01/15/19 1448 01/16/19 0449 01/17/19 0404  NA 136 139 137  K 4.4 5.4* 5.3*  CL 95* 94* 95*  CO2 19* 22 22  GLUCOSE 246* 126* 183*  BUN 69* 77* 75*  CREATININE 15.95* 17.25* 17.21*  CALCIUM 8.2* 8.2* 7.6*   CBC: Recent Labs  Lab 01/15/19 1448 01/16/19 0449 01/17/19 0404  WBC 10.8* 10.2 9.8  HGB 11.5* 12.3 11.0*  HCT 36.4 37.6 33.1*  MCV 106.4* 103.3* 103.4*  PLT 351 335 310   Blood Culture    Component Value Date/Time   SDES PERITONEAL CAVITY 04/18/2018 1347   SDES PERITONEAL CAVITY 04/18/2018 1347   SPECREQUEST NONE 04/18/2018 1347   SPECREQUEST NONE 04/18/2018 1347   CULT  04/18/2018 1347    NO GROWTH 5 DAYS Performed at [facility], [place] [address], [place], [place] [address]    REPTSTATUS 04/23/2018 FINAL 04/18/2018 1347   REPTSTATUS 04/18/2018 FINAL 04/18/2018 1347     Medications: . sodium chloride    . dialysis solution 1.5% low-MG/low-CA    . dialysis solution 2.5% low-MG/low-CA    . heparin 1,250 Units/hr (01/17/19 0823)   . aspirin EC  81 mg Oral Daily  . calcium acetate  667 mg Oral TID WC  . cinacalcet  90 mg Oral Q supper  . ferric citrate  210 mg Oral TID WC  . folic acid  1 mg Oral Daily  . gabapentin   100 mg Oral BID  . gentamicin cream  1 application Topical Daily  . insulin aspart  0-5 Units Subcutaneous QHS  . insulin aspart  0-9 Units Subcutaneous TID WC  . metoprolol tartrate  12.5 mg Oral BID  . nitroGLYCERIN  0.5 inch Topical Q6H  . rosuvastatin  10 mg Oral Daily  . sodium chloride flush  3 mL Intravenous Once  . sodium chloride flush  3 mL Intravenous Q12H  . ticagrelor  180 mg Oral Once    Dialysis Orders:  CCPD 6 exchanges 2.7L 1.5h dwell No day dwell. (Patient says she has been doing 5  exchanges)   Assessment/Plan: 1. NSTEMI. Known multivessel CAD. [place] 12/28 with progressive severe 3V disease. TCTS consult for possible CABG.  Has decided to attempt PCI. For intervention in cath lab today.  On heparin gtt, nitro, ASA, BB per cardiology.  2. ESRD -   Continue CCPD for now. Orders written. Follow labs 3. Hypertension/volume  - BP controlled. Low this admission. No volume on exam. Use 1.5%/ 2.5% combination on PD.  4. Anemia  - Hgb 11. On ESA as outpatient  5. Metabolic bone disease -  Ca ok. Continue home binders.  Follow labs here.  6. Nutrition - Renal diet/vitamins  Lynnda Child PA-C [facility] Pager [phone] 01/17/2019,12:10 PM  LOS: 2 days   Pt seen, examined and agree w A/P as above.  Kelly Splinter  MD 01/17/2019, 1:00 PM

## 2019-01-17 NOTE — Interval H&P Note (Signed)
Cath Lab Visit (complete for each Cath Lab visit)  Clinical Evaluation Leading to the Procedure:   ACS: Yes.    Non-ACS:    Anginal Classification: CCS IV  Anti-ischemic medical therapy: Minimal Therapy (1 class of medications)  Non-Invasive Test Results: No non-invasive testing performed  Prior CABG: No previous CABG   Patient declined CABG.  WIil plan for PCI of RCA and will likely attempt PCI of OM.    History and Physical Interval Note:  01/17/2019 3:02 PM  Brenda Davis  has presented today for surgery, with the diagnosis of cad.  The various methods of treatment have been discussed with the patient and family. After consideration of risks, benefits and other options for treatment, the patient has consented to  Procedure(s): CORONARY STENT INTERVENTION (N/A) as a surgical intervention.  The patient's history has been reviewed, patient examined, no change in status, stable for surgery.  I have reviewed the patient's chart and labs.  Questions were answered to the patient's satisfaction.     Larae Grooms

## 2019-01-17 NOTE — Anesthesia Procedure Notes (Signed)
Procedure Name: Intubation Date/Time: 01/17/2019 7:26 PM Performed by: Claris Che, CRNA Pre-anesthesia Checklist: Patient identified, Emergency Drugs available, Suction available, Patient being monitored and Timeout performed Patient Re-evaluated:Patient Re-evaluated prior to induction Oxygen Delivery Method: Circle system utilized Preoxygenation: Pre-oxygenation with 100% oxygen Induction Type: IV induction, Rapid sequence and Cricoid Pressure applied Laryngoscope Size: Mac and 4 Grade View: Grade II Tube type: Oral Tube size: 7.5 mm Number of attempts: 1 Airway Equipment and Method: Stylet Placement Confirmation: ETT inserted through vocal cords under direct vision,  positive ETCO2 and breath sounds checked- equal and bilateral Secured at: 23 cm Tube secured with: Tape Dental Injury: Teeth and Oropharynx as per pre-operative assessment

## 2019-01-17 NOTE — Progress Notes (Signed)
[place] for Heparin  Indication: chest pain/ACS  Allergies  Allergen Reactions  . Amlodipine Swelling  . Atorvastatin     Elevated LFT's  . Clonidine Derivatives Swelling    Limbs swell  . Doxycycline Nausea And Vomiting    "I threw up for 3 hours"  . Welchol [Colesevelam Hcl] Nausea Only    Patient Measurements: Height: 5' 5"  (165.1 cm) Weight: 292 lb 12.3 oz (132.8 kg) IBW/kg (Calculated) : 57 Heparin Dosing Weight: 87.4 kg  Vital Signs: Temp: 98.1 F (36.7 C) (12/28 2054) Temp Source: Oral (12/28 2054) BP: 111/56 (12/28 2237) Pulse Rate: 75 (12/28 2237)  Labs: Recent Labs    01/15/19 1448 01/15/19 1806 01/16/19 0449 01/16/19 1017 01/17/19 0404  HGB 11.5*  --  12.3  --   --   HCT 36.4  --  37.6  --   --   PLT 351  --  335  --   --   HEPARINUNFRC  --   --  0.38 0.34 0.12*  CREATININE 15.95*  --  17.25*  --   --   TROPONINIHS 43* 781*  --   --   --     Estimated Creatinine Clearance: 5.1 mL/min (A) (by C-G formula based on SCr of 17.25 mg/dL (H)).   Assessment: Patient is a 44 yof that presented to the ED with c/o chest pain. The patient had a slightly elevated trop and repeat was significantly increased. Pharmacy was asked to start a heparin drip on this patient.   Now s/p cath lab 12/28, deciding on CABG vs PCI with stents. Heparin resumed post cath.  Heparin level subtherapeutic (0.12) on gtt at 100 units/hr.  Goal of Therapy:  Heparin level 0.3-0.7 units/ml Monitor platelets by anticoagulation protocol: Yes   Plan:  Increase IV heparin to 1250 units/hr. F/u 8 hr heparin level  Sherlon Handing, PharmD, BCPS Please see amion for complete clinical pharmacist phone list  01/17/2019 4:55 AM

## 2019-01-17 NOTE — Progress Notes (Signed)
PROGRESS NOTE    Brenda Davis  RCV:[id number] DOB: [birth year] DOA: 01/15/2019 PCP: [name], MD     Brief Narrative:  Brenda Davis is a 53 year old female with past medical history significant for CAD, type 2 diabetes, hypertension, hyperlipidemia, ESRD on peritoneal dialysis who presents with intermittent symptoms of fatigue, decreased exercise tolerance, chest pain that resolved with nitroglycerin.  Her last heart cath was 06/20/2018.  In the emergency department, work-up revealed troponin elevation >700.  Cardiology was consulted.  Patient underwent heart cath 12/28.  She was recommended for CABG.  New events last 24 hours / Subjective: Had couple episodes of chest pain overnight with improvement after nitro.  Assessment & Plan:   Active Problems:   Dyslipidemia, goal LDL below 70   S/P laparoscopic sleeve gastrectomy   ESRD on peritoneal dialysis (HCC)   DM (diabetes mellitus), type 2 with renal complications (HCC)   Hepatic cirrhosis (HCC)   NSTEMI (non-ST elevated myocardial infarction) ([place])   CAD in native artery   Elevated troponin    NSTEMI with history of CAD -Status post left heart cath 12/28 with progressive severe three-vessel disease -Cardiology and cardiothoracic surgery have been discussing PCI versus CABG -Aspirin, heparin drip, Lopressor  Hyperlipidemia -Crestor  ESRD on peritoneal dialysis -Nephrology following  Type 2 diabetes with renal complication -Hemoglobin A1c 7.2 -Sliding-scale insulin  Morbid obesity -Estimated body mass index is 48.65 kg/m as calculated from the following:   Height as of this encounter: 5' 5"  (1.651 m).   Weight as of this encounter: 132.6 kg.   DVT prophylaxis: Heparin drip Code Status: Full code Family Communication: No family at bedside Disposition Plan: Pending further cardiology/cardiothoracic surgery plan   Consultants:   Cardiology  Cardiothoracic  surgery  Nephrology   Antimicrobials:  Anti-infectives (From admission, onward)   None        Objective: Vitals:   01/17/19 0624 01/17/19 0735 01/17/19 0823 01/17/19 0825  BP: (!) 89/53 105/65 105/65   Pulse: 72 75  83  Resp:  20    Temp: 98.1 F (36.7 C) 98 F (36.7 C)    TempSrc: Oral Oral    SpO2: 99% 100%  98%  Weight:  132.6 kg    Height:        Intake/Output Summary (Last 24 hours) at 01/17/2019 1344 Last data filed at 01/17/2019 0431 Gross per 24 hour  Intake 647.8 ml  Output --  Net 647.8 ml   Filed Weights   01/15/19 2346 01/16/19 1800 01/17/19 0735  Weight: 131.3 kg 132.8 kg 132.6 kg    Examination:  General exam: Appears calm and comfortable  Respiratory system: Clear to auscultation. Respiratory effort normal. No respiratory distress. No conversational dyspnea.  Cardiovascular system: S1 & S2 heard, RRR. No murmurs. No pedal edema. Gastrointestinal system: Abdomen is nondistended, soft and nontender. Normal bowel sounds heard. Central nervous system: Alert and oriented. No focal neurological deficits. Speech clear.  Extremities: Symmetric in appearance  Skin: No rashes, lesions or ulcers on exposed skin  Psychiatry: Judgement and insight appear normal. Mood & affect appropriate.   Data Reviewed: I have personally reviewed following labs and imaging studies  CBC: Recent Labs  Lab 01/15/19 1448 01/16/19 0449 01/17/19 0404  WBC 10.8* 10.2 9.8  HGB 11.5* 12.3 11.0*  HCT 36.4 37.6 33.1*  MCV 106.4* 103.3* 103.4*  PLT 351 335 102   Basic Metabolic Panel: Recent Labs  Lab 01/15/19 1448 01/16/19 0449 01/17/19 0404  NA 136 139  137  K 4.4 5.4* 5.3*  CL 95* 94* 95*  CO2 19* 22 22  GLUCOSE 246* 126* 183*  BUN 69* 77* 75*  CREATININE 15.95* 17.25* 17.21*  CALCIUM 8.2* 8.2* 7.6*   GFR: Estimated Creatinine Clearance: 5.1 mL/min (A) (by C-G formula based on SCr of 17.21 mg/dL (H)). Liver Function Tests: Recent Labs  Lab 01/15/19 1448   AST 15  ALT 17  ALKPHOS 98  BILITOT 0.3  PROT 7.1  ALBUMIN 3.5   No results for input(s): LIPASE, AMYLASE in the last 168 hours. No results for input(s): AMMONIA in the last 168 hours. Coagulation Profile: No results for input(s): INR, PROTIME in the last 168 hours. Cardiac Enzymes: No results for input(s): CKTOTAL, CKMB, CKMBINDEX, TROPONINI in the last 168 hours. BNP (last 3 results) No results for input(s): PROBNP in the last 8760 hours. HbA1C: Recent Labs    01/16/19 0449 01/17/19 0404  HGBA1C 8.0* 7.2*   CBG: Recent Labs  Lab 01/16/19 1849 01/16/19 2052 01/17/19 0819 01/17/19 1124  GLUCAP 217* 180* 156* 124*   Lipid Profile: Recent Labs    01/16/19 0449  CHOL 185  HDL 39*  LDLCALC 116*  TRIG 150*  CHOLHDL 4.7   Thyroid Function Tests: No results for input(s): TSH, T4TOTAL, FREET4, T3FREE, THYROIDAB in the last 72 hours. Anemia Panel: No results for input(s): VITAMINB12, FOLATE, FERRITIN, TIBC, IRON, RETICCTPCT in the last 72 hours. Sepsis Labs: No results for input(s): PROCALCITON, LATICACIDVEN in the last 168 hours.  Recent Results (from the past 240 hour(s))  SARS CORONAVIRUS 2 (TAT 6-24 HRS) Nasopharyngeal Nasopharyngeal Swab     Status: None   Collection Time: 01/15/19  9:38 PM   Specimen: Nasopharyngeal Swab  Result Value Ref Range Status   SARS Coronavirus 2 NEGATIVE NEGATIVE Final    Comment: (NOTE) SARS-CoV-2 target nucleic acids are NOT DETECTED. The SARS-CoV-2 RNA is generally detectable in upper and lower respiratory specimens during the acute phase of infection. Negative results do not preclude SARS-CoV-2 infection, do not rule out co-infections with other pathogens, and should not be used as the sole basis for treatment or other patient management decisions. Negative results must be combined with clinical observations, patient history, and epidemiological information. The expected result is Negative. Fact Sheet for  Patients: SugarRoll.be Fact Sheet for Healthcare Providers: https://www.woods-mathews.com/ This test is not yet approved or cleared by the [place] FDA and  has been authorized for detection and/or diagnosis of SARS-CoV-2 by FDA under an Emergency Use Authorization (EUA). This EUA will remain  in effect (meaning this test can be used) for the duration of the COVID-19 declaration under Section 56 4(b)(1) of the Act, 21 U.S.C. section 360bbb-3(b)(1), unless the authorization is terminated or revoked sooner. Performed at [facility], [place] [address], [place], [place] [address]       Radiology Studies: DG Chest 2 View  Result Date: 01/15/2019 CLINICAL DATA:  Chest pain since 1 p.m., history end-stage renal disease on dialysis, hypertension, type II diabetes mellitus, coronary artery disease EXAM: CHEST - 2 VIEW COMPARISON:  06/18/2018 FINDINGS: Normal heart size, mediastinal contours, and pulmonary vascularity. Minimal subsegmental atelectasis at lingula and RIGHT base. Lungs otherwise clear. No infiltrate, pleural effusion or pneumothorax. Bones demineralized. IMPRESSION: Minimal bibasilar atelectasis. Electronically Signed   By: Lavonia Dana M.D.   On: 01/15/2019 15:35   CARDIAC CATHETERIZATION  Result Date: 01/16/2019  Mid LAD lesion is 75% stenosed. The LAD is diffusely diseased and heavily calcified.  Mid Cx to Dist Cx lesion is 55% stenosed with 40% stenosed side branch in Ost 3rd Mrg.  Prox RCA lesion is 75% stenosed. This disease appears to have progressed.  Ost 1st Mrg-1 lesion is 50% stenosed.  Ost 1st Mrg-2 lesion is 99% stenosed. This lesion is significantly worse than prior.  The left ventricular systolic function is normal.  LV end diastolic pressure is normal.  The left ventricular ejection fraction is 55-65% by visual estimate.  There is no aortic valve stenosis.  Ost LAD to Mid LAD lesion is 40% stenosed.  Severe  multivessel disease with progression of calcific disease in the mid LAD, proximal RCA and tortuous mid OM1.  Plan for surgical consult.    ECHOCARDIOGRAM COMPLETE  Result Date: 01/16/2019   ECHOCARDIOGRAM REPORT   Patient Name:   Brenda Davis Date of Exam: 01/16/2019 Medical Rec #:  [id number]                 Height:       65.0 in Accession #:    [id number]                Weight:       289.5 lb Date of Birth:  01-30-1965                BSA:          2.31 m Patient Age:    32 years                  BP:           116/73 mmHg Patient Gender: F                         HR:           69 bpm. Exam Location:  Inpatient Procedure: 2D Echo, Color Doppler, Cardiac Doppler and Intracardiac            Opacification Agent Indications:    Acute MI  History:        Patient has prior history of Echocardiogram examinations, most                 recent 01/04/2015. CAD; Risk Factors:Diabetes, Hypertension and                 Dyslipidemia. ESRD.  Sonographer:    Raquel Sarna Senior RDCS Referring Phys: [phone] [facility]  Sonographer Comments: Technically difficult study due to poor echo windows. IMPRESSIONS  1. Left ventricular ejection fraction, by visual estimation, is 65 to 70%. The left ventricle has hyperdynamic function. There is moderately increased left ventricular hypertrophy.  2. Definity contrast agent was given IV to delineate the left ventricular endocardial borders.  3. Left ventricular diastolic parameters are consistent with Grade I diastolic dysfunction (impaired relaxation).  4. The left ventricle has no regional wall motion abnormalities.  5. Global right ventricle has normal systolic function.The right ventricular size is mildly enlarged. No increase in right ventricular wall thickness.  6. Left atrial size was normal.  7. Right atrial size was normal.  8. The pericardial effusion is posterior to the left ventricle.  9. Trivial pericardial effusion is present. 10. The mitral valve was not well  visualized. Trivial mitral valve regurgitation. 11. The tricuspid valve is not well visualized. 12. The aortic valve is tricuspid. Aortic valve regurgitation is not visualized. Mild aortic valve sclerosis without stenosis. 13. The pulmonic valve was grossly normal.  Pulmonic valve regurgitation is not visualized. 14. The inferior vena cava is normal in size with <50% respiratory variability, suggesting right atrial pressure of 8 mmHg. 15. The interatrial septum was not well visualized. FINDINGS  Left Ventricle: Left ventricular ejection fraction, by visual estimation, is 65 to 70%. The left ventricle has hyperdynamic function. Definity contrast agent was given IV to delineate the left ventricular endocardial borders. The left ventricle has no regional wall motion abnormalities. There is moderately increased left ventricular hypertrophy. Left ventricular diastolic parameters are consistent with Grade I diastolic dysfunction (impaired relaxation). Indeterminate filling pressures. Right Ventricle: The right ventricular size is mildly enlarged. No increase in right ventricular wall thickness. Global RV systolic function is has normal systolic function. Left Atrium: Left atrial size was normal in size. Right Atrium: Right atrial size was normal in size Pericardium: Trivial pericardial effusion is present. The pericardial effusion is posterior to the left ventricle. Mitral Valve: The mitral valve was not well visualized. Trivial mitral valve regurgitation. Tricuspid Valve: The tricuspid valve is not well visualized. Tricuspid valve regurgitation is not demonstrated. Aortic Valve: The aortic valve is tricuspid. Aortic valve regurgitation is not visualized. Mild aortic valve sclerosis is present, with no evidence of aortic valve stenosis. Pulmonic Valve: The pulmonic valve was grossly normal. Pulmonic valve regurgitation is not visualized. Pulmonic regurgitation is not visualized. Aorta: The aortic root and ascending aorta  are structurally normal, with no evidence of dilitation. Venous: The inferior vena cava is normal in size with less than 50% respiratory variability, suggesting right atrial pressure of 8 mmHg. IAS/Shunts: The interatrial septum was not well visualized.  LEFT VENTRICLE PLAX 2D LVIDd:         3.84 cm  Diastology LVIDs:         2.29 cm  LV e' lateral:   5.11 cm/s LV PW:         1.25 cm  LV E/e' lateral: 17.2 LV IVS:        1.27 cm  LV e' medial:    5.11 cm/s LVOT diam:     2.00 cm  LV E/e' medial:  17.2 LV SV:         46 ml LV SV Index:   18.01 LVOT Area:     3.14 cm  RIGHT VENTRICLE RV S prime:     12.40 cm/s TAPSE (M-mode): 1.5 cm LEFT ATRIUM             Index       RIGHT ATRIUM           Index LA diam:        4.20 cm 1.81 cm/m  RA Area:     20.50 cm LA Vol (A2C):   56.6 ml 24.45 ml/m RA Volume:   66.70 ml  28.81 ml/m LA Vol (A4C):   40.1 ml 17.32 ml/m LA Biplane Vol: 49.5 ml 21.38 ml/m  AORTIC VALVE LVOT Vmax:   78.30 cm/s LVOT Vmean:  53.400 cm/s LVOT VTI:    0.152 m  AORTA Ao Root diam: 2.70 cm Ao Asc diam:  3.30 cm MITRAL VALVE MV Area (PHT): 2.60 cm              SHUNTS MV PHT:        84.68 msec            Systemic VTI:  0.15 m MV Decel Time: 292 msec              Systemic Diam: 2.00  cm MV E velocity: 87.80 cm/s  103 cm/s MV A velocity: 102.00 cm/s 70.3 cm/s MV E/A ratio:  0.86        1.5  Lyman Bishop MD Electronically signed by Lyman Bishop MD Signature Date/Time: 01/16/2019/10:43:37 AM    Final       Scheduled Meds: . aspirin EC  81 mg Oral Daily  . calcium acetate  667 mg Oral TID WC  . cinacalcet  90 mg Oral Q supper  . ferric citrate  210 mg Oral TID WC  . folic acid  1 mg Oral Daily  . gabapentin  100 mg Oral BID  . gentamicin cream  1 application Topical Daily  . insulin aspart  0-5 Units Subcutaneous QHS  . insulin aspart  0-9 Units Subcutaneous TID WC  . metoprolol tartrate  12.5 mg Oral BID  . nitroGLYCERIN  0.5 inch Topical Q6H  . rosuvastatin  10 mg Oral Daily  . sodium  chloride flush  3 mL Intravenous Once  . sodium chloride flush  3 mL Intravenous Q12H  . sodium chloride flush  3 mL Intravenous Q12H   Continuous Infusions: . sodium chloride    . sodium chloride    . sodium chloride 10 mL/hr at 01/17/19 1236  . dialysis solution 1.5% low-MG/low-CA    . dialysis solution 2.5% low-MG/low-CA    . heparin 1,250 Units/hr (01/17/19 0823)     LOS: 2 days      Time spent: 35 minutes   Dessa Phi, DO [facility] 01/17/2019, 1:44 PM   Available via Epic secure chat 7am-7pm After these hours, please refer to coverage provider listed on [email or web address]

## 2019-01-17 NOTE — Transfer of Care (Signed)
Immediate Anesthesia Transfer of Care Note  Patient: Brenda Davis  Procedure(s) Performed: Exploration Right Groin with Primary Closure or Arteriotomy Site; Washout of Retroperitoneal Hematoma (Right )  Patient Location: PACU  Anesthesia Type:General  Level of Consciousness: oriented, drowsy, patient cooperative and responds to stimulation  Airway & Oxygen Therapy: Patient Spontanous Breathing and Patient connected to face mask oxygen  Post-op Assessment: Report given to RN, Post -op Vital signs reviewed and stable and Patient moving all extremities X 4  Post vital signs: Reviewed and unstable  Last Vitals:  Vitals Value Taken Time  BP 84/61 01/17/19 2047  Temp 36.5 C 01/17/19 2045  Pulse 70 01/17/19 2057  Resp 16 01/17/19 2057  SpO2 100 % 01/17/19 2057  Vitals shown include unvalidated device data.  Last Pain:  Vitals:   01/17/19 1652  TempSrc:   PainSc: 0-No pain         Complications: No apparent anesthesia complications

## 2019-01-17 NOTE — Progress Notes (Signed)
When leaving the cath lab patient began to complain of dizziness after moving her to bed, BP 130/70, notified MD and took patient to floor. Once on the floor, patient BP dropped to 50/30 and became lethargic. Laid patient flat, removed nitro patch on left shoulder, notified MD, receiving floor charge paged rapid response, CBG checked and found to be in the 150s. Per MD verbal order over phone gave patient 200 ml fluid bolus. After interventions, BP increased to 68/40, patient stated she felt better and was able to follow commands. MD ordered stat CT to rule out retroperitoneal bleed. Groin site tender, patient complaining of pressure and pain in stomach and right groin, no hematoma palpable at site and no external bleeding evident. Rapid response nurse arrived to transport patient to CT with a team of cardiac cath techs and nurses, gave rapid response nurse report and informed nurse of patient's dialysis status. Obtained husband, Anthony's, phone number and informed him that someone would be calling him to update him on his wife's status as soon as we knew more information. His cell number is [phone]

## 2019-01-17 NOTE — Significant Event (Signed)
Rapid Response Event Note  Overview: Time Called: 1716 Arrival Time: 1720 Event Type: Hypotension  Patient arrived from Cath Lab sp stents placed x2.  Right femoral arterial sheath present.  Site is unremarkable  Initial Focused Assessment: Patient is complaining of dizziness and pain in her abdomen and lower back.  BP 57/38  SR 71  RR 18  O2 sat 98%  Interventions: 250 cc NS bolus BP improved to 98/66 then decreased to 46/33 Levophed started at 12mg/min titrated to 10 mcg/min. NS bolus restarted BP 43/30-124/79 Levophed titrated to 31m/min CT abdomen pelvis done Dr VaIrish Lackt bedside intermittently holding manual pressure.   Dr CaDonzetta Mattersonsulted:  Plan OR  [place]  Event Summary: Name of Physician Notified: VaIrish Lackt 1710  Name of Consulting Physician Notified: CaDonzetta Matterst 1800  Outcome: Transferred (Comment)  Event End Time: [id number] Ip

## 2019-01-17 NOTE — Progress Notes (Addendum)
   Called due to acute onset hypotension post procedure, multivessel PCI.    BPs in the 70/40 range upon arrival to [facility] with patient reporting right groin pain.  Given the recent PCI, I ordered stat CT to r/o RP bleed.  I personallly reviewed these images and it appeared there was a right RP hematoma.  I called Dr. Donzetta Matters from vascular to eval.  We reviewed the CT findings and agreed that given that she was unstable, she would likelty need surgery.  IV fluids were ordered.  Levophed was started as well.   I spoke to the patient's husband, Elberta Fortis, about the recent events and the need for surgery.  GEN: Well nourished, well developed, in mild distress  HEENT: normal  Neck: no JVD, carotid bruits, or masses Cardiac: RRR; no murmurs, rubs, or gallops,no edema ;  No evidence of distant heart sounds Respiratory:  No wheezing to auscultation bilaterally, normal work of breathing GI: obese MS: no deformity or atrophy ; right groin soft, without hematoma- but tender to deep palpation, 2+ right PT pulse Skin: warm and dry, no rash Neuro:  Strength and sensation are intact Psych: flat affect  It was noted that the femoral sheath would not aspirate.  When her pannus was pulled up, the a-line would aspirate and a pressure tracing would register.   I personally help pressure on her right groin in the event that the source of bleeding could be compressed.  I noticed that her BP would fluctuate.  Levophed had been started and this was titrated.  THe patient reported back pain and groin pain.  Further discussion with Dr. Donzetta Matters occurred and the patient eventually went to the OR for exploration.   Critical care time 70 minutes.   Jettie Booze, MD

## 2019-01-17 NOTE — H&P (View-Only) (Signed)
Progress Note  Patient Name: Brenda Davis Date of Encounter: 01/17/2019  Primary Cardiologist: Skeet Latch, MD   Subjective   Had brief episode of chest pain overnight.   Inpatient Medications    Scheduled Meds: . aspirin EC  81 mg Oral Daily  . calcium acetate  667 mg Oral TID WC  . cinacalcet  90 mg Oral Q supper  . ferric citrate  210 mg Oral TID WC  . folic acid  1 mg Oral Daily  . gabapentin  100 mg Oral BID  . gentamicin cream  1 application Topical Daily  . insulin aspart  0-5 Units Subcutaneous QHS  . insulin aspart  0-9 Units Subcutaneous TID WC  . metoprolol tartrate  12.5 mg Oral BID  . nitroGLYCERIN  0.5 inch Topical Q6H  . rosuvastatin  10 mg Oral Daily  . sodium chloride flush  3 mL Intravenous Once  . sodium chloride flush  3 mL Intravenous Q12H   Continuous Infusions: . sodium chloride    . dialysis solution 1.5% low-MG/low-CA    . dialysis solution 2.5% low-MG/low-CA    . heparin 1,250 Units/hr (01/17/19 0535)   PRN Meds: sodium chloride, acetaminophen, fentaNYL (SUBLIMAZE) injection, gentamicin cream, dianeal solution for CAPD/CCPD with heparin, dianeal solution for CAPD/CCPD with heparin, nitroGLYCERIN, ondansetron (ZOFRAN) IV, sodium chloride flush   Vital Signs    Vitals:   01/16/19 1920 01/16/19 2054 01/16/19 2237 01/17/19 0624  BP: (!) 105/56 92/60 (!) 111/56 (!) 89/53  Pulse:  75 75 72  Resp:      Temp:  98.1 F (36.7 C)  98.1 F (36.7 C)  TempSrc:  Oral  Oral  SpO2:  95%  99%  Weight:      Height:        Intake/Output Summary (Last 24 hours) at 01/17/2019 0809 Last data filed at 01/17/2019 0431 Gross per 24 hour  Intake 647.8 ml  Output --  Net 647.8 ml   Last 3 Weights 01/16/2019 01/15/2019 01/15/2019  Weight (lbs) 292 lb 12.3 oz 289 lb 8 oz 275 lb 9.2 oz  Weight (kg) 132.8 kg 131.316 kg 125 kg      Telemetry    SR - Personally Reviewed  ECG    SR q waves in inferior leads - Personally  Reviewed  Physical Exam  Pleasant AAF, sitting up on the side of bed eating breakfast. GEN: No acute distress.   Neck: No JVD Cardiac: RRR, no murmurs, rubs, or gallops.  Respiratory: Clear to auscultation bilaterally. GI: Soft, nontender, non-distended  MS: No edema; No deformity. Right femoral cath site stable.  Neuro:  Nonfocal  Psych: Normal affect   Labs    High Sensitivity Troponin:   Recent Labs  Lab 01/15/19 1448 01/15/19 1806  TROPONINIHS 43* 781*      Chemistry Recent Labs  Lab 01/15/19 1448 01/16/19 0449 01/17/19 0404  NA 136 139 137  K 4.4 5.4* 5.3*  CL 95* 94* 95*  CO2 19* 22 22  GLUCOSE 246* 126* 183*  BUN 69* 77* 75*  CREATININE 15.95* 17.25* 17.21*  CALCIUM 8.2* 8.2* 7.6*  PROT 7.1  --   --   ALBUMIN 3.5  --   --   AST 15  --   --   ALT 17  --   --   ALKPHOS 98  --   --   BILITOT 0.3  --   --   GFRNONAA 2* 2* 2*  GFRAA 3* 2* 2*  ANIONGAP 22* 23* 20*     Hematology Recent Labs  Lab 01/15/19 1448 01/16/19 0449 01/17/19 0404  WBC 10.8* 10.2 9.8  RBC 3.42* 3.64* 3.20*  HGB 11.5* 12.3 11.0*  HCT 36.4 37.6 33.1*  MCV 106.4* 103.3* 103.4*  MCH 33.6 33.8 34.4*  MCHC 31.6 32.7 33.2  RDW 13.2 12.9 12.8  PLT 351 335 310    BNPNo results for input(s): BNP, PROBNP in the last 168 hours.   DDimer No results for input(s): DDIMER in the last 168 hours.   Radiology    DG Chest 2 View  Result Date: 01/15/2019 CLINICAL DATA:  Chest pain since 1 p.m., history end-stage renal disease on dialysis, hypertension, type II diabetes mellitus, coronary artery disease EXAM: CHEST - 2 VIEW COMPARISON:  06/18/2018 FINDINGS: Normal heart size, mediastinal contours, and pulmonary vascularity. Minimal subsegmental atelectasis at lingula and RIGHT base. Lungs otherwise clear. No infiltrate, pleural effusion or pneumothorax. Bones demineralized. IMPRESSION: Minimal bibasilar atelectasis. Electronically Signed   By: Lavonia Dana M.D.   On: 01/15/2019 15:35    CARDIAC CATHETERIZATION  Result Date: 01/16/2019  Mid LAD lesion is 75% stenosed. The LAD is diffusely diseased and heavily calcified.  Mid Cx to Dist Cx lesion is 55% stenosed with 40% stenosed side branch in Ost 3rd Mrg.  Prox RCA lesion is 75% stenosed. This disease appears to have progressed.  Ost 1st Mrg-1 lesion is 50% stenosed.  Ost 1st Mrg-2 lesion is 99% stenosed. This lesion is significantly worse than prior.  The left ventricular systolic function is normal.  LV end diastolic pressure is normal.  The left ventricular ejection fraction is 55-65% by visual estimate.  There is no aortic valve stenosis.  Ost LAD to Mid LAD lesion is 40% stenosed.  Severe multivessel disease with progression of calcific disease in the mid LAD, proximal RCA and tortuous mid OM1.  Plan for surgical consult.    ECHOCARDIOGRAM COMPLETE  Result Date: 01/16/2019   ECHOCARDIOGRAM REPORT   Patient Name:   Brenda Davis Date of Exam: 01/16/2019 Medical Rec #:  [id number]                 Height:       65.0 in Accession #:    [id number]                Weight:       289.5 lb Date of Birth:  [birth year]                BSA:          2.31 m Patient Age:    79 years                  BP:           116/73 mmHg Patient Gender: F                         HR:           69 bpm. Exam Location:  Inpatient Procedure: 2D Echo, Color Doppler, Cardiac Doppler and Intracardiac            Opacification Agent Indications:    Acute MI  History:        Patient has prior history of Echocardiogram examinations, most                 recent 01/04/2015. CAD; Risk Factors:Diabetes, Hypertension and  Dyslipidemia. ESRD.  Sonographer:    Raquel Sarna Senior RDCS Referring Phys: [phone] [facility] MARTIN  Sonographer Comments: Technically difficult study due to poor echo windows. IMPRESSIONS  1. Left ventricular ejection fraction, by visual estimation, is 65 to 70%. The left ventricle has hyperdynamic function. There is  moderately increased left ventricular hypertrophy.  2. Definity contrast agent was given IV to delineate the left ventricular endocardial borders.  3. Left ventricular diastolic parameters are consistent with Grade I diastolic dysfunction (impaired relaxation).  4. The left ventricle has no regional wall motion abnormalities.  5. Global right ventricle has normal systolic function.The right ventricular size is mildly enlarged. No increase in right ventricular wall thickness.  6. Left atrial size was normal.  7. Right atrial size was normal.  8. The pericardial effusion is posterior to the left ventricle.  9. Trivial pericardial effusion is present. 10. The mitral valve was not well visualized. Trivial mitral valve regurgitation. 11. The tricuspid valve is not well visualized. 12. The aortic valve is tricuspid. Aortic valve regurgitation is not visualized. Mild aortic valve sclerosis without stenosis. 13. The pulmonic valve was grossly normal. Pulmonic valve regurgitation is not visualized. 14. The inferior vena cava is normal in size with <50% respiratory variability, suggesting right atrial pressure of 8 mmHg. 15. The interatrial septum was not well visualized. FINDINGS  Left Ventricle: Left ventricular ejection fraction, by visual estimation, is 65 to 70%. The left ventricle has hyperdynamic function. Definity contrast agent was given IV to delineate the left ventricular endocardial borders. The left ventricle has no regional wall motion abnormalities. There is moderately increased left ventricular hypertrophy. Left ventricular diastolic parameters are consistent with Grade I diastolic dysfunction (impaired relaxation). Indeterminate filling pressures. Right Ventricle: The right ventricular size is mildly enlarged. No increase in right ventricular wall thickness. Global RV systolic function is has normal systolic function. Left Atrium: Left atrial size was normal in size. Right Atrium: Right atrial size was normal  in size Pericardium: Trivial pericardial effusion is present. The pericardial effusion is posterior to the left ventricle. Mitral Valve: The mitral valve was not well visualized. Trivial mitral valve regurgitation. Tricuspid Valve: The tricuspid valve is not well visualized. Tricuspid valve regurgitation is not demonstrated. Aortic Valve: The aortic valve is tricuspid. Aortic valve regurgitation is not visualized. Mild aortic valve sclerosis is present, with no evidence of aortic valve stenosis. Pulmonic Valve: The pulmonic valve was grossly normal. Pulmonic valve regurgitation is not visualized. Pulmonic regurgitation is not visualized. Aorta: The aortic root and ascending aorta are structurally normal, with no evidence of dilitation. Venous: The inferior vena cava is normal in size with less than 50% respiratory variability, suggesting right atrial pressure of 8 mmHg. IAS/Shunts: The interatrial septum was not well visualized.  LEFT VENTRICLE PLAX 2D LVIDd:         3.84 cm  Diastology LVIDs:         2.29 cm  LV e' lateral:   5.11 cm/s LV PW:         1.25 cm  LV E/e' lateral: 17.2 LV IVS:        1.27 cm  LV e' medial:    5.11 cm/s LVOT diam:     2.00 cm  LV E/e' medial:  17.2 LV SV:         46 ml LV SV Index:   18.01 LVOT Area:     3.14 cm  RIGHT VENTRICLE RV S prime:     12.40 cm/s TAPSE (M-mode):  1.5 cm LEFT ATRIUM             Index       RIGHT ATRIUM           Index LA diam:        4.20 cm 1.81 cm/m  RA Area:     20.50 cm LA Vol (A2C):   56.6 ml 24.45 ml/m RA Volume:   66.70 ml  28.81 ml/m LA Vol (A4C):   40.1 ml 17.32 ml/m LA Biplane Vol: 49.5 ml 21.38 ml/m  AORTIC VALVE LVOT Vmax:   78.30 cm/s LVOT Vmean:  53.400 cm/s LVOT VTI:    0.152 m  AORTA Ao Root diam: 2.70 cm Ao Asc diam:  3.30 cm MITRAL VALVE MV Area (PHT): 2.60 cm              SHUNTS MV PHT:        84.68 msec            Systemic VTI:  0.15 m MV Decel Time: 292 msec              Systemic Diam: 2.00 cm MV E velocity: 87.80 cm/s  103 cm/s MV A  velocity: 102.00 cm/s 70.3 cm/s MV E/A ratio:  0.86        1.5  Lyman Bishop MD Electronically signed by Lyman Bishop MD Signature Date/Time: 01/16/2019/10:43:37 AM    Final     Cardiac Studies   Cath: 01/16/19   Mid LAD lesion is 75% stenosed. The LAD is diffusely diseased and heavily calcified.  Mid Cx to Dist Cx lesion is 55% stenosed with 40% stenosed side branch in Ost 3rd Mrg.  Prox RCA lesion is 75% stenosed. This disease appears to have progressed.  Ost 1st Mrg-1 lesion is 50% stenosed.  Ost 1st Mrg-2 lesion is 99% stenosed. This lesion is significantly worse than prior.  The left ventricular systolic function is normal.  LV end diastolic pressure is normal.  The left ventricular ejection fraction is 55-65% by visual estimate.  There is no aortic valve stenosis.  Ost LAD to Mid LAD lesion is 40% stenosed.   Severe multivessel disease with progression of calcific disease in the mid LAD, proximal RCA and tortuous mid OM1.    Plan for surgical consult.    Diagnostic Dominance: Right   TTE: 01/16/19  IMPRESSIONS    1. Left ventricular ejection fraction, by visual estimation, is 65 to 70%. The left ventricle has hyperdynamic function. There is moderately increased left ventricular hypertrophy.  2. Definity contrast agent was given IV to delineate the left ventricular endocardial borders.  3. Left ventricular diastolic parameters are consistent with Grade I diastolic dysfunction (impaired relaxation).  4. The left ventricle has no regional wall motion abnormalities.  5. Global right ventricle has normal systolic function.The right ventricular size is mildly enlarged. No increase in right ventricular wall thickness.  6. Left atrial size was normal.  7. Right atrial size was normal.  8. The pericardial effusion is posterior to the left ventricle.  9. Trivial pericardial effusion is present. 10. The mitral valve was not well visualized. Trivial mitral valve  regurgitation. 11. The tricuspid valve is not well visualized. 12. The aortic valve is tricuspid. Aortic valve regurgitation is not visualized. Mild aortic valve sclerosis without stenosis. 13. The pulmonic valve was grossly normal. Pulmonic valve regurgitation is not visualized. 14. The inferior vena cava is normal in size with <50% respiratory variability, suggesting right atrial pressure of 8  mmHg. 15. The interatrial septum was not well visualized.   Patient Profile     53 y.o. female with a hx of CAD (moderate, non-significant by invasive hemodynamic evaluation in June 2020), chronic CP,who was seen for the evaluation of CP and elevated troponinat the request of Dr. Gilles Chiquito ([facility]).  Assessment & Plan    1. NSTEMI with chest pain: HsT peaked 781. Reports this episode of chest pain was very similar to what she experienced back in June when she had her cath. Had been using SL nitro for intermittent episodes of chest pain with relief generally. Took 2 nitro the day of admission without improvement. Underwent cath noted above with multivessel CAD, consult to [facility]CTS. Patient is hesitent regarding surgery and would like to proceed with PCI. Will review with MD regarding timing -- Remains on IV heparin, nitropaste, ASA, BB and statin. BB added this admission.  -- Echo with normal EF and G1DD  2. ESRD on PD: seen by nephrology and had PD session yesterday.   3. HL: on renal dosed statin. Reports she was recently switched from atorva to Crestor 2/2 to elevated LFTs. Consider PCSK9s if unable to reach goal LDL.   4.Hyperkalemia: K+ 5.3 this morning. Per nephrology  5. DM: Hgb A1c 8.0 which is up from 6.6 back in February. Reports she and her husband having been struggling with diet since COVID. Motivated to make changes.  -- SSI   For questions or updates, please contact [place] Please consult [email or web address] for contact info under        Signed, Reino Bellis,  NP  01/17/2019, 8:09 AM    ATTENDING ATTESTATION  I have seen, examined and evaluated the patient this AM along with Reino Bellis, NP-C.  After reviewing all the available data and chart, we discussed the patients laboratory, study & physical findings as well as symptoms in detail. I agree with her findings, examination as well as impression recommendations as per our discussion.    She is very nervous and anxious about her options.  I think she is very concerned about her recovery from bypass surgery and is leaning more toward considering PCI.  After long discussion, she and her husband decided that they would prefer to attempt PCI today.  I explained her that this would be PCI of the RCA with possible PCI of the ramus lesion.  This is the 1 that is more concerning.  I think the plan will be to then treat the LAD medically for now.  I did explain to her that if revascularization of the LAD is warranted in the future, that LIMA graft would probably the best option for her. --> We will preload with Brilinta today and plan PCI this afternoon with Dr. Irish Lack.  She otherwise needs aggressive risk recommendation.  Diabetes is now becoming an issue.  She is currently on sliding scale insulin but we will need to address this as far as outpatient management.  Probably will get diabetes team assistance to establish an outpatient regimen.  She probably will need a PC ACE inhibitor she not able to tolerate her statin.  Completely reassess in the outpatient setting.    Glenetta Hew, M.D., M.S. Interventional Cardiologist   Pager # [phone] Phone # [phone] [address] [place], [place] [address]

## 2019-01-17 NOTE — Op Note (Signed)
    Patient name: Brenda Davis MRN: [id number] DOB: 1965/07/12 Sex: female  01/17/2019 Pre-operative Diagnosis: Right retroperitoneal hematoma status post cardiac catheterization Post-operative diagnosis:  Same Surgeon:  Erlene Quan C. Donzetta Matters, MD. Assistant: Laurence Slate, PA Procedure Performed:   Exploration of right groin with primary closure of arteriotomy site and washout of retroperitoneal hematoma  Indications: 53 year old female underwent cardiac catheterization stenting of 2 lesions earlier in the day.  She is chronically on Brilinta.  She was heparinized and the case.  Postoperatively she was hypotensive underwent CT scan which demonstrated right retroperitoneal hematoma.  She was therefore indicated for exploration of right groin.  Findings: Sheath was through the distal part of the inguinal ligament into the distal external iliac artery.  This was dislodged with incision.  The artery was primarily repaired.  Hematoma was evacuated and the wound was primarily closed.   Procedure:  The patient was identified in the holding area and taken to the operating room where she is placed supine operative general anesthesia induced.  She was sterilely prepped and draped in the right groin in the usual fashion antibiotics were administered and a timeout was called.  We began with longitudinal incision above and below the existing sheath.  We dissected down through skin subcutaneous tissue.  Unfortunately with dissection down the sheath was dislodged.  Pressure was held over the bleeding site.  We dissected down to the inguinal ligament.  There is bleeding coming through the inguinal ligament.  We divided this and elevated the ligament.  We dissected down onto the external neck artery we were to clamp this up under the inguinal ligament the retroperitoneum.  We then clamped the common femoral artery distally.  The arteriotomy was primarily closed with 5-0 Prolene suture.  There was a previous  arteriotomy in the common femoral artery that was also closed primarily.  We thoroughly irrigated the wound.  We evacuated the retroperitoneal hematoma.  We obtain hemostasis.  We closed in layers with interrupted 2-0 and 3-0 Vicryl and placed skin staples above that.  She was then awakened from anesthesia having tolerated procedure without immediate complication.  All counts were correct at completion.  EBL: 100 cc.   Noemi Ishmael C. Donzetta Matters, MD [facility] Office: [phone] Pager: [phone]

## 2019-01-17 NOTE — Progress Notes (Addendum)
Progress Note  Patient Name: Brenda Davis Date of Encounter: 01/17/2019  Primary Cardiologist: Skeet Latch, MD   Subjective   Had brief episode of chest pain overnight.   Inpatient Medications    Scheduled Meds: . aspirin EC  81 mg Oral Daily  . calcium acetate  667 mg Oral TID WC  . cinacalcet  90 mg Oral Q supper  . ferric citrate  210 mg Oral TID WC  . folic acid  1 mg Oral Daily  . gabapentin  100 mg Oral BID  . gentamicin cream  1 application Topical Daily  . insulin aspart  0-5 Units Subcutaneous QHS  . insulin aspart  0-9 Units Subcutaneous TID WC  . metoprolol tartrate  12.5 mg Oral BID  . nitroGLYCERIN  0.5 inch Topical Q6H  . rosuvastatin  10 mg Oral Daily  . sodium chloride flush  3 mL Intravenous Once  . sodium chloride flush  3 mL Intravenous Q12H   Continuous Infusions: . sodium chloride    . dialysis solution 1.5% low-MG/low-CA    . dialysis solution 2.5% low-MG/low-CA    . heparin 1,250 Units/hr (01/17/19 0535)   PRN Meds: sodium chloride, acetaminophen, fentaNYL (SUBLIMAZE) injection, gentamicin cream, dianeal solution for CAPD/CCPD with heparin, dianeal solution for CAPD/CCPD with heparin, nitroGLYCERIN, ondansetron (ZOFRAN) IV, sodium chloride flush   Vital Signs    Vitals:   01/16/19 1920 01/16/19 2054 01/16/19 2237 01/17/19 0624  BP: (!) 105/56 92/60 (!) 111/56 (!) 89/53  Pulse:  75 75 72  Resp:      Temp:  98.1 F (36.7 C)  98.1 F (36.7 C)  TempSrc:  Oral  Oral  SpO2:  95%  99%  Weight:      Height:        Intake/Output Summary (Last 24 hours) at 01/17/2019 0809 Last data filed at 01/17/2019 0431 Gross per 24 hour  Intake 647.8 ml  Output --  Net 647.8 ml   Last 3 Weights 01/16/2019 01/15/2019 01/15/2019  Weight (lbs) 292 lb 12.3 oz 289 lb 8 oz 275 lb 9.2 oz  Weight (kg) 132.8 kg 131.316 kg 125 kg      Telemetry    SR - Personally Reviewed  ECG    SR q waves in inferior leads - Personally  Reviewed  Physical Exam  Pleasant AAF, sitting up on the side of bed eating breakfast. GEN: No acute distress.   Neck: No JVD Cardiac: RRR, no murmurs, rubs, or gallops.  Respiratory: Clear to auscultation bilaterally. GI: Soft, nontender, non-distended  MS: No edema; No deformity. Right femoral cath site stable.  Neuro:  Nonfocal  Psych: Normal affect   Labs    High Sensitivity Troponin:   Recent Labs  Lab 01/15/19 1448 01/15/19 1806  TROPONINIHS 43* 781*      Chemistry Recent Labs  Lab 01/15/19 1448 01/16/19 0449 01/17/19 0404  NA 136 139 137  K 4.4 5.4* 5.3*  CL 95* 94* 95*  CO2 19* 22 22  GLUCOSE 246* 126* 183*  BUN 69* 77* 75*  CREATININE 15.95* 17.25* 17.21*  CALCIUM 8.2* 8.2* 7.6*  PROT 7.1  --   --   ALBUMIN 3.5  --   --   AST 15  --   --   ALT 17  --   --   ALKPHOS 98  --   --   BILITOT 0.3  --   --   GFRNONAA 2* 2* 2*  GFRAA 3* 2* 2*  ANIONGAP 22* 23* 20*     Hematology Recent Labs  Lab 01/15/19 1448 01/16/19 0449 01/17/19 0404  WBC 10.8* 10.2 9.8  RBC 3.42* 3.64* 3.20*  HGB 11.5* 12.3 11.0*  HCT 36.4 37.6 33.1*  MCV 106.4* 103.3* 103.4*  MCH 33.6 33.8 34.4*  MCHC 31.6 32.7 33.2  RDW 13.2 12.9 12.8  PLT 351 335 310    BNPNo results for input(s): BNP, PROBNP in the last 168 hours.   DDimer No results for input(s): DDIMER in the last 168 hours.   Radiology    DG Chest 2 View  Result Date: 01/15/2019 CLINICAL DATA:  Chest pain since 1 p.m., history end-stage renal disease on dialysis, hypertension, type II diabetes mellitus, coronary artery disease EXAM: CHEST - 2 VIEW COMPARISON:  06/18/2018 FINDINGS: Normal heart size, mediastinal contours, and pulmonary vascularity. Minimal subsegmental atelectasis at lingula and RIGHT base. Lungs otherwise clear. No infiltrate, pleural effusion or pneumothorax. Bones demineralized. IMPRESSION: Minimal bibasilar atelectasis. Electronically Signed   By: Lavonia Dana M.D.   On: 01/15/2019 15:35    CARDIAC CATHETERIZATION  Result Date: 01/16/2019  Mid LAD lesion is 75% stenosed. The LAD is diffusely diseased and heavily calcified.  Mid Cx to Dist Cx lesion is 55% stenosed with 40% stenosed side branch in Ost 3rd Mrg.  Prox RCA lesion is 75% stenosed. This disease appears to have progressed.  Ost 1st Mrg-1 lesion is 50% stenosed.  Ost 1st Mrg-2 lesion is 99% stenosed. This lesion is significantly worse than prior.  The left ventricular systolic function is normal.  LV end diastolic pressure is normal.  The left ventricular ejection fraction is 55-65% by visual estimate.  There is no aortic valve stenosis.  Ost LAD to Mid LAD lesion is 40% stenosed.  Severe multivessel disease with progression of calcific disease in the mid LAD, proximal RCA and tortuous mid OM1.  Plan for surgical consult.    ECHOCARDIOGRAM COMPLETE  Result Date: 01/16/2019   ECHOCARDIOGRAM REPORT   Patient Name:   Brenda Davis Date of Exam: 01/16/2019 Medical Rec #:  [id number]                 Height:       65.0 in Accession #:    [id number]                Weight:       289.5 lb Date of Birth:  05/06/1965                BSA:          2.31 m Patient Age:    53 years                  BP:           116/73 mmHg Patient Gender: F                         HR:           69 bpm. Exam Location:  Inpatient Procedure: 2D Echo, Color Doppler, Cardiac Doppler and Intracardiac            Opacification Agent Indications:    Acute MI  History:        Patient has prior history of Echocardiogram examinations, most                 recent 01/04/2015. CAD; Risk Factors:Diabetes, Hypertension and  Dyslipidemia. ESRD.  Sonographer:    Raquel Sarna Senior RDCS Referring Phys: [phone] [facility] MARTIN  Sonographer Comments: Technically difficult study due to poor echo windows. IMPRESSIONS  1. Left ventricular ejection fraction, by visual estimation, is 65 to 70%. The left ventricle has hyperdynamic function. There is  moderately increased left ventricular hypertrophy.  2. Definity contrast agent was given IV to delineate the left ventricular endocardial borders.  3. Left ventricular diastolic parameters are consistent with Grade I diastolic dysfunction (impaired relaxation).  4. The left ventricle has no regional wall motion abnormalities.  5. Global right ventricle has normal systolic function.The right ventricular size is mildly enlarged. No increase in right ventricular wall thickness.  6. Left atrial size was normal.  7. Right atrial size was normal.  8. The pericardial effusion is posterior to the left ventricle.  9. Trivial pericardial effusion is present. 10. The mitral valve was not well visualized. Trivial mitral valve regurgitation. 11. The tricuspid valve is not well visualized. 12. The aortic valve is tricuspid. Aortic valve regurgitation is not visualized. Mild aortic valve sclerosis without stenosis. 13. The pulmonic valve was grossly normal. Pulmonic valve regurgitation is not visualized. 14. The inferior vena cava is normal in size with <50% respiratory variability, suggesting right atrial pressure of 8 mmHg. 15. The interatrial septum was not well visualized. FINDINGS  Left Ventricle: Left ventricular ejection fraction, by visual estimation, is 65 to 70%. The left ventricle has hyperdynamic function. Definity contrast agent was given IV to delineate the left ventricular endocardial borders. The left ventricle has no regional wall motion abnormalities. There is moderately increased left ventricular hypertrophy. Left ventricular diastolic parameters are consistent with Grade I diastolic dysfunction (impaired relaxation). Indeterminate filling pressures. Right Ventricle: The right ventricular size is mildly enlarged. No increase in right ventricular wall thickness. Global RV systolic function is has normal systolic function. Left Atrium: Left atrial size was normal in size. Right Atrium: Right atrial size was normal  in size Pericardium: Trivial pericardial effusion is present. The pericardial effusion is posterior to the left ventricle. Mitral Valve: The mitral valve was not well visualized. Trivial mitral valve regurgitation. Tricuspid Valve: The tricuspid valve is not well visualized. Tricuspid valve regurgitation is not demonstrated. Aortic Valve: The aortic valve is tricuspid. Aortic valve regurgitation is not visualized. Mild aortic valve sclerosis is present, with no evidence of aortic valve stenosis. Pulmonic Valve: The pulmonic valve was grossly normal. Pulmonic valve regurgitation is not visualized. Pulmonic regurgitation is not visualized. Aorta: The aortic root and ascending aorta are structurally normal, with no evidence of dilitation. Venous: The inferior vena cava is normal in size with less than 50% respiratory variability, suggesting right atrial pressure of 8 mmHg. IAS/Shunts: The interatrial septum was not well visualized.  LEFT VENTRICLE PLAX 2D LVIDd:         3.84 cm  Diastology LVIDs:         2.29 cm  LV e' lateral:   5.11 cm/s LV PW:         1.25 cm  LV E/e' lateral: 17.2 LV IVS:        1.27 cm  LV e' medial:    5.11 cm/s LVOT diam:     2.00 cm  LV E/e' medial:  17.2 LV SV:         46 ml LV SV Index:   18.01 LVOT Area:     3.14 cm  RIGHT VENTRICLE RV S prime:     12.40 cm/s TAPSE (M-mode):  1.5 cm LEFT ATRIUM             Index       RIGHT ATRIUM           Index LA diam:        4.20 cm 1.81 cm/m  RA Area:     20.50 cm LA Vol (A2C):   56.6 ml 24.45 ml/m RA Volume:   66.70 ml  28.81 ml/m LA Vol (A4C):   40.1 ml 17.32 ml/m LA Biplane Vol: 49.5 ml 21.38 ml/m  AORTIC VALVE LVOT Vmax:   78.30 cm/s LVOT Vmean:  53.400 cm/s LVOT VTI:    0.152 m  AORTA Ao Root diam: 2.70 cm Ao Asc diam:  3.30 cm MITRAL VALVE MV Area (PHT): 2.60 cm              SHUNTS MV PHT:        84.68 msec            Systemic VTI:  0.15 m MV Decel Time: 292 msec              Systemic Diam: 2.00 cm MV E velocity: 87.80 cm/s  103 cm/s MV A  velocity: 102.00 cm/s 70.3 cm/s MV E/A ratio:  0.86        1.5  Lyman Bishop MD Electronically signed by Lyman Bishop MD Signature Date/Time: 01/16/2019/10:43:37 AM    Final     Cardiac Studies   Cath: 01/16/19   Mid LAD lesion is 75% stenosed. The LAD is diffusely diseased and heavily calcified.  Mid Cx to Dist Cx lesion is 55% stenosed with 40% stenosed side branch in Ost 3rd Mrg.  Prox RCA lesion is 75% stenosed. This disease appears to have progressed.  Ost 1st Mrg-1 lesion is 50% stenosed.  Ost 1st Mrg-2 lesion is 99% stenosed. This lesion is significantly worse than prior.  The left ventricular systolic function is normal.  LV end diastolic pressure is normal.  The left ventricular ejection fraction is 55-65% by visual estimate.  There is no aortic valve stenosis.  Ost LAD to Mid LAD lesion is 40% stenosed.   Severe multivessel disease with progression of calcific disease in the mid LAD, proximal RCA and tortuous mid OM1.    Plan for surgical consult.    Diagnostic Dominance: Right   TTE: 01/16/19  IMPRESSIONS    1. Left ventricular ejection fraction, by visual estimation, is 65 to 70%. The left ventricle has hyperdynamic function. There is moderately increased left ventricular hypertrophy.  2. Definity contrast agent was given IV to delineate the left ventricular endocardial borders.  3. Left ventricular diastolic parameters are consistent with Grade I diastolic dysfunction (impaired relaxation).  4. The left ventricle has no regional wall motion abnormalities.  5. Global right ventricle has normal systolic function.The right ventricular size is mildly enlarged. No increase in right ventricular wall thickness.  6. Left atrial size was normal.  7. Right atrial size was normal.  8. The pericardial effusion is posterior to the left ventricle.  9. Trivial pericardial effusion is present. 10. The mitral valve was not well visualized. Trivial mitral valve  regurgitation. 11. The tricuspid valve is not well visualized. 12. The aortic valve is tricuspid. Aortic valve regurgitation is not visualized. Mild aortic valve sclerosis without stenosis. 13. The pulmonic valve was grossly normal. Pulmonic valve regurgitation is not visualized. 14. The inferior vena cava is normal in size with <50% respiratory variability, suggesting right atrial pressure of 8  mmHg. 15. The interatrial septum was not well visualized.   Patient Profile     53 y.o. female with a hx of CAD (moderate, non-significant by invasive hemodynamic evaluation in June 2020), chronic CP,who was seen for the evaluation of CP and elevated troponinat the request of Dr. Gilles Chiquito ([facility]).  Assessment & Plan    1. NSTEMI with chest pain: HsT peaked 781. Reports this episode of chest pain was very similar to what she experienced back in June when she had her cath. Had been using SL nitro for intermittent episodes of chest pain with relief generally. Took 2 nitro the day of admission without improvement. Underwent cath noted above with multivessel CAD, consult to TCTS. Patient is hesitent regarding surgery and would like to proceed with PCI. Will review with MD regarding timing -- Remains on IV heparin, nitropaste, ASA, BB and statin. BB added this admission.  -- Echo with normal EF and G1DD  2. ESRD on PD: seen by nephrology and had PD session yesterday.   3. HL: on renal dosed statin. Reports she was recently switched from atorva to Crestor 2/2 to elevated LFTs. Consider PCSK9s if unable to reach goal LDL.   4.Hyperkalemia: K+ 5.3 this morning. Per nephrology  5. DM: Hgb A1c 8.0 which is up from 6.6 back in February. Reports she and her husband having been struggling with diet since COVID. Motivated to make changes.  -- SSI   For questions or updates, please contact [facility] Please consult [email or web address] for contact info under        Signed, Reino Bellis,  NP  01/17/2019, 8:09 AM    ATTENDING ATTESTATION  I have seen, examined and evaluated the patient this AM along with Reino Bellis, NP-C.  After reviewing all the available data and chart, we discussed the patients laboratory, study & physical findings as well as symptoms in detail. I agree with her findings, examination as well as impression recommendations as per our discussion.    She is very nervous and anxious about her options.  I think she is very concerned about her recovery from bypass surgery and is leaning more toward considering PCI.  After long discussion, she and her husband decided that they would prefer to attempt PCI today.  I explained her that this would be PCI of the RCA with possible PCI of the ramus lesion.  This is the 1 that is more concerning.  I think the plan will be to then treat the LAD medically for now.  I did explain to her that if revascularization of the LAD is warranted in the future, that LIMA graft would probably the best option for her. --> We will preload with Brilinta today and plan PCI this afternoon with Dr. Irish Lack.  She otherwise needs aggressive risk recommendation.  Diabetes is now becoming an issue.  She is currently on sliding scale insulin but we will need to address this as far as outpatient management.  Probably will get diabetes team assistance to establish an outpatient regimen.  She probably will need a PC ACE inhibitor she not able to tolerate her statin.  Completely reassess in the outpatient setting.    Glenetta Hew, M.D., M.S. Interventional Cardiologist   Pager # [phone] Phone # [phone] [address] [place], [place] [address]

## 2019-01-17 NOTE — Progress Notes (Signed)
     [address]       [place] [address]             [phone]       Chest pain overnight.  Resolved with nitro.  Vitals with BMI 01/17/2019 01/17/2019 01/17/2019  Height - - -  Weight - 292 lbs 5 oz -  BMI - 15.93 -  Systolic 012 379 89  Diastolic 65 65 53  Pulse - 75 72    Alert NAD Regular EWOB  53 yo female presents with NSTEMI, multivessel coronary disease I have recommended that she undergo surgical bypass, but she remains hesitant.  I explained the risks and benefits again, and further explained that she likely will not get complete revascularization with PCI.  I will discuss this with cardiology again.    Agapito Hanway Bary Leriche

## 2019-01-17 NOTE — Anesthesia Preprocedure Evaluation (Addendum)
Anesthesia Evaluation  Patient identified by MRN, date of birth, ID band Patient confused    Reviewed: Allergy & Precautions, Patient's Chart, lab work & pertinent test results, Unable to perform ROS - Chart review onlyPreop documentation limited or incomplete due to emergent nature of procedure.  History of Anesthesia Complications Negative for: history of anesthetic complications  Airway Mallampati: III  TM Distance: >3 FB Neck ROM: Full    Dental  (+) Dental Advisory Given   Pulmonary asthma , former smoker,     + decreased breath sounds      Cardiovascular hypertension, + CAD, + Past MI and + Cardiac Stents   Rhythm:Regular Rate:Normal   Cath earlier today - Mid LAD lesion is 75% stenosed. The LAD was not addressed and will be managed medically. Ost LAD to Mid LAD lesion is 40% stenosed. Mid Cx to Dist Cx lesion is 55% stenosed with 40% stenosed side branch in Ost 3rd Mrg. Prox RCA lesion is 75% stenosed. A drug-eluting stent was successfully placed using a SYNERGY XD 3.0X38, postdilated to 3.5 mm. Post intervention, there is a 0% residual stenosis. Ost 1st Mrg lesion is 99% stenosed. EBU4 Guide catheter used. A drug-eluting stent was successfully placed using a SYNERGY XD 2.25X24, postdilated to 2.75 mm. Post intervention, there is a 0% residual stenosis.  '20 TTE - EF 65 to 70%. Moderately increased left ventricular hypertrophy. Grade I diastolic dysfunction (impaired relaxation). Trivial pericardial effusion is posterior to the left ventricle. Trivial MR. Mild aortic valve sclerosis without stenosis.    Neuro/Psych    GI/Hepatic  S/p gastric sleeve   Endo/Other  diabetes, Type 2Morbid obesity Hyperkalemia (chronic)   Renal/GU ESRF and DialysisRenal disease     Musculoskeletal  Gout    Abdominal (+) + obese,   Peds  Hematology  (+) anemia ,   Anesthesia Other Findings Covid neg 12/27  Reproductive/Obstetrics                            Anesthesia Physical Anesthesia Plan  ASA: IV and emergent  Anesthesia Plan: General   Post-op Pain Management:    Induction: Intravenous and Rapid sequence  PONV Risk Score and Plan: 3 and Treatment may vary due to age or medical condition, Ondansetron, Dexamethasone and Midazolam  Airway Management Planned: Oral ETT  Additional Equipment: Arterial line, CVP and Ultrasound Guidance Line Placement  Intra-op Plan:   Post-operative Plan: Possible Post-op intubation/ventilation  Informed Consent:     Only emergency history available and History available from chart only  Plan Discussed with: CRNA and Anesthesiologist  Anesthesia Plan Comments: (No consent obtained due to altered mental status and emergent nature of case)       Anesthesia Quick Evaluation

## 2019-01-17 NOTE — Anesthesia Procedure Notes (Signed)
Central Venous Catheter Insertion Performed by: Audry Pili, MD, anesthesiologist Start/End12/29/2020 7:28 PM, 01/17/2019 7:36 PM Patient location: Pre-op. Preanesthetic checklist: patient identified, IV checked, monitors and equipment checked, pre-op evaluation and timeout performed Position: Trendelenburg Patient sedated Hand hygiene performed , maximum sterile barriers used  and Seldinger technique used Catheter size: 8 Fr Central line was placed.Double lumen Procedure performed using ultrasound guided technique. Ultrasound Notes:anatomy identified, needle tip was noted to be adjacent to the nerve/plexus identified, no ultrasound evidence of intravascular and/or intraneural injection and image(s) printed for medical record Attempts: 1 Following insertion, line sutured, dressing applied and Biopatch. Post procedure assessment: blood return through all ports, free fluid flow and no air  Patient tolerated the procedure well with no immediate complications.

## 2019-01-17 NOTE — Progress Notes (Signed)
[facility] for Heparin  Indication: chest pain/ACS  Allergies  Allergen Reactions  . Amlodipine Swelling  . Atorvastatin     Elevated LFT's  . Clonidine Derivatives Swelling    Limbs swell  . Doxycycline Nausea And Vomiting    "I threw up for 3 hours"  . Welchol [Colesevelam Hcl] Nausea Only    Patient Measurements: Height: 5' 5"  (165.1 cm) Weight: 292 lb 5.3 oz (132.6 kg) IBW/kg (Calculated) : 57 Heparin Dosing Weight: 87.4 kg  Vital Signs: Temp: 98.8 F (37.1 C) (12/29 1344) Temp Source: Oral (12/29 1344) BP: 111/69 (12/29 1344) Pulse Rate: 78 (12/29 1344)  Labs: Recent Labs    01/15/19 1448 01/15/19 1448 01/15/19 1806 01/16/19 0449 01/16/19 1017 01/17/19 0404 01/17/19 1302  HGB 11.5*  --   --  12.3  --  11.0*  --   HCT 36.4  --   --  37.6  --  33.1*  --   PLT 351  --   --  335  --  310  --   HEPARINUNFRC  --    < >  --  0.38 0.34 0.12* 0.51  CREATININE 15.95*  --   --  17.25*  --  17.21*  --   TROPONINIHS 43*  --  781*  --   --   --   --    < > = values in this interval not displayed.    Estimated Creatinine Clearance: 5.1 mL/min (A) (by C-G formula based on SCr of 17.21 mg/dL (H)).   Assessment: Patient is a 76 yof that presented to the ED with c/o chest pain. The patient had a slightly elevated trop and repeat was significantly increased. Pharmacy was asked to start a heparin drip on this patient.   Now s/p cath lab 12/28, deciding on CABG vs PCI with stents. Heparin resumed post cath.  Heparin level currently at goal.  Going to cath lab this afternoon.  Goal of Therapy:  Heparin level 0.3-0.7 units/ml Monitor platelets by anticoagulation protocol: Yes   Plan:  Continue IV heparin at 1250 units/hr. F/u plans for heparin after cath lab.  Marguerite Olea, [facility] Clinical Pharmacist Phone [phone]  01/17/2019 2:08 PM

## 2019-01-17 NOTE — Progress Notes (Signed)
Received from CT to cath lab holding area room 6. Dr. Irish Lack in to see patient. C/O tenderness rt groin. Hypotensive. Skin warm and dry. Patient wanting to sit up. HOB down.   Starting Levophed drip per order. Large ridge palpated rt groin now. Dr. Irish Lack holding manual pressure rt groin; rt dp dopplered

## 2019-01-17 NOTE — Anesthesia Postprocedure Evaluation (Signed)
Anesthesia Post Note  Patient: KASHONDA SARKISYAN  Procedure(s) Performed: Exploration Right Groin with Primary Closure or Arteriotomy Site; Washout of Retroperitoneal Hematoma (Right )     Patient location during evaluation: PACU Anesthesia Type: General Level of consciousness: awake Pain management: pain level controlled Vital Signs Assessment: post-procedure vital signs reviewed and stable Respiratory status: spontaneous breathing, nonlabored ventilation, respiratory function stable and patient connected to face mask oxygen Cardiovascular status: stable (Remains on levophed) Postop Assessment: no apparent nausea or vomiting Anesthetic complications: no    Last Vitals:  Vitals:   01/17/19 1831 01/17/19 2045  BP: (!) 92/54 (!) 84/61  Pulse:  72  Resp: (!) 22 10  Temp:  36.5 C  SpO2:  100%    Last Pain:  Vitals:   01/17/19 2045  TempSrc:   PainSc: 0-No pain                 Audry Pili

## 2019-01-18 DIAGNOSIS — Z6841 Body Mass Index (BMI) 40.0 and over, adult: Secondary | ICD-10-CM

## 2019-01-18 DIAGNOSIS — D62 Acute posthemorrhagic anemia: Secondary | ICD-10-CM

## 2019-01-18 DIAGNOSIS — Z794 Long term (current) use of insulin: Secondary | ICD-10-CM

## 2019-01-18 DIAGNOSIS — E872 Acidosis: Secondary | ICD-10-CM

## 2019-01-18 DIAGNOSIS — I9581 Postprocedural hypotension: Secondary | ICD-10-CM

## 2019-01-18 DIAGNOSIS — E8729 Other acidosis: Secondary | ICD-10-CM | POA: Diagnosis not present

## 2019-01-18 DIAGNOSIS — R58 Hemorrhage, not elsewhere classified: Secondary | ICD-10-CM | POA: Diagnosis not present

## 2019-01-18 DIAGNOSIS — E875 Hyperkalemia: Secondary | ICD-10-CM

## 2019-01-18 DIAGNOSIS — R579 Shock, unspecified: Secondary | ICD-10-CM

## 2019-01-18 LAB — RENAL FUNCTION PANEL
Albumin: 2.5 g/dL — ABNORMAL LOW (ref 3.5–5.0)
Anion gap: 25 — ABNORMAL HIGH (ref 5–15)
BUN: 78 mg/dL — ABNORMAL HIGH (ref 6–20)
CO2: 15 mmol/L — ABNORMAL LOW (ref 22–32)
Calcium: 7.4 mg/dL — ABNORMAL LOW (ref 8.9–10.3)
Chloride: 96 mmol/L — ABNORMAL LOW (ref 98–111)
Creatinine, Ser: 17.26 mg/dL — ABNORMAL HIGH (ref 0.44–1.00)
GFR calc Af Amer: 2 mL/min — ABNORMAL LOW (ref >60)
GFR calc non Af Amer: 2 mL/min — ABNORMAL LOW (ref >60)
Glucose, Bld: 330 mg/dL — ABNORMAL HIGH (ref 70–99)
Phosphorus: 11.5 mg/dL — ABNORMAL HIGH (ref 2.5–4.6)
Potassium: 6 mmol/L — ABNORMAL HIGH (ref 3.5–5.1)
Sodium: 136 mmol/L (ref 135–145)

## 2019-01-18 LAB — POCT I-STAT 7, (LYTES, BLD GAS, ICA,H+H)
Acid-base deficit: 14 mmol/L — ABNORMAL HIGH (ref 0.0–2.0)
Acid-base deficit: 6 mmol/L — ABNORMAL HIGH (ref 0.0–2.0)
Acid-base deficit: 9 mmol/L — ABNORMAL HIGH (ref 0.0–2.0)
Bicarbonate: 15.9 mmol/L — ABNORMAL LOW (ref 20.0–28.0)
Bicarbonate: 18.3 mmol/L — ABNORMAL LOW (ref 20.0–28.0)
Bicarbonate: 20.6 mmol/L (ref 20.0–28.0)
Calcium, Ion: 0.9 mmol/L — ABNORMAL LOW (ref 1.15–1.40)
Calcium, Ion: 0.91 mmol/L — ABNORMAL LOW (ref 1.15–1.40)
Calcium, Ion: 0.92 mmol/L — ABNORMAL LOW (ref 1.15–1.40)
HCT: 31 % — ABNORMAL LOW (ref 36.0–46.0)
HCT: 33 % — ABNORMAL LOW (ref 36.0–46.0)
HCT: 37 % (ref 36.0–46.0)
Hemoglobin: 10.5 g/dL — ABNORMAL LOW (ref 12.0–15.0)
Hemoglobin: 11.2 g/dL — ABNORMAL LOW (ref 12.0–15.0)
Hemoglobin: 12.6 g/dL (ref 12.0–15.0)
O2 Saturation: 98 %
O2 Saturation: 99 %
O2 Saturation: 99 %
Patient temperature: 97.4
Patient temperature: 97.9
Patient temperature: 98
Potassium: 5.9 mmol/L — ABNORMAL HIGH (ref 3.5–5.1)
Potassium: 6.2 mmol/L — ABNORMAL HIGH (ref 3.5–5.1)
Potassium: 6.8 mmol/L (ref 3.5–5.1)
Sodium: 131 mmol/L — ABNORMAL LOW (ref 135–145)
Sodium: 132 mmol/L — ABNORMAL LOW (ref 135–145)
Sodium: 133 mmol/L — ABNORMAL LOW (ref 135–145)
TCO2: 17 mmol/L — ABNORMAL LOW (ref 22–32)
TCO2: 20 mmol/L — ABNORMAL LOW (ref 22–32)
TCO2: 22 mmol/L (ref 22–32)
pCO2 arterial: 42 mmHg (ref 32.0–48.0)
pCO2 arterial: 43 mmHg (ref 32.0–48.0)
pCO2 arterial: 52.6 mmHg — ABNORMAL HIGH (ref 32.0–48.0)
pH, Arterial: 7.083 — CL (ref 7.350–7.450)
pH, Arterial: 7.246 — ABNORMAL LOW (ref 7.350–7.450)
pH, Arterial: 7.288 — ABNORMAL LOW (ref 7.350–7.450)
pO2, Arterial: 107 mmHg (ref 83.0–108.0)
pO2, Arterial: 157 mmHg — ABNORMAL HIGH (ref 83.0–108.0)
pO2, Arterial: 196 mmHg — ABNORMAL HIGH (ref 83.0–108.0)

## 2019-01-18 LAB — POCT I-STAT, CHEM 8
BUN: 73 mg/dL — ABNORMAL HIGH (ref 6–20)
Calcium, Ion: 0.86 mmol/L — CL (ref 1.15–1.40)
Chloride: 99 mmol/L (ref 98–111)
Creatinine, Ser: 17.4 mg/dL — ABNORMAL HIGH (ref 0.44–1.00)
Glucose, Bld: 249 mg/dL — ABNORMAL HIGH (ref 70–99)
HCT: 27 % — ABNORMAL LOW (ref 36.0–46.0)
Hemoglobin: 9.2 g/dL — ABNORMAL LOW (ref 12.0–15.0)
Potassium: 4.8 mmol/L (ref 3.5–5.1)
Sodium: 133 mmol/L — ABNORMAL LOW (ref 135–145)
TCO2: 20 mmol/L — ABNORMAL LOW (ref 22–32)

## 2019-01-18 LAB — CBC
HCT: 32.6 % — ABNORMAL LOW (ref 36.0–46.0)
Hemoglobin: 10.9 g/dL — ABNORMAL LOW (ref 12.0–15.0)
MCH: 32.5 pg (ref 26.0–34.0)
MCHC: 33.4 g/dL (ref 30.0–36.0)
MCV: 97.3 fL (ref 80.0–100.0)
Platelets: 302 10*3/uL (ref 150–400)
RBC: 3.35 MIL/uL — ABNORMAL LOW (ref 3.87–5.11)
RDW: 17.5 % — ABNORMAL HIGH (ref 11.5–15.5)
WBC: 35.9 10*3/uL — ABNORMAL HIGH (ref 4.0–10.5)
nRBC: 0.4 % — ABNORMAL HIGH (ref 0.0–0.2)

## 2019-01-18 LAB — LACTIC ACID, PLASMA: Lactic Acid, Venous: 4.6 mmol/L (ref 0.5–1.9)

## 2019-01-18 LAB — POTASSIUM
Potassium: 5.9 mmol/L — ABNORMAL HIGH (ref 3.5–5.1)
Potassium: 5.3 mmol/L — ABNORMAL HIGH (ref 3.5–5.1)

## 2019-01-18 LAB — GLUCOSE, CAPILLARY
Glucose-Capillary: 271 mg/dL — ABNORMAL HIGH (ref 70–99)
Glucose-Capillary: 327 mg/dL — ABNORMAL HIGH (ref 70–99)
Glucose-Capillary: 349 mg/dL — ABNORMAL HIGH (ref 70–99)
Glucose-Capillary: 301 mg/dL — ABNORMAL HIGH (ref 70–99)

## 2019-01-18 LAB — BASIC METABOLIC PANEL
Anion gap: 22 — ABNORMAL HIGH (ref 5–15)
BUN: 77 mg/dL — ABNORMAL HIGH (ref 6–20)
CO2: 18 mmol/L — ABNORMAL LOW (ref 22–32)
Calcium: 7.1 mg/dL — ABNORMAL LOW (ref 8.9–10.3)
Chloride: 96 mmol/L — ABNORMAL LOW (ref 98–111)
Creatinine, Ser: 17.28 mg/dL — ABNORMAL HIGH (ref 0.44–1.00)
GFR calc Af Amer: 2 mL/min — ABNORMAL LOW (ref >60)
GFR calc non Af Amer: 2 mL/min — ABNORMAL LOW (ref >60)
Glucose, Bld: 380 mg/dL — ABNORMAL HIGH (ref 70–99)
Potassium: 6.9 mmol/L (ref 3.5–5.1)
Sodium: 136 mmol/L (ref 135–145)

## 2019-01-18 LAB — MRSA PCR SCREENING: MRSA by PCR: NEGATIVE

## 2019-01-18 LAB — POCT ACTIVATED CLOTTING TIME: Activated Clotting Time: 191 seconds

## 2019-01-18 LAB — BASIC METABOLIC PANEL WITH GFR
Anion gap: 22 — ABNORMAL HIGH (ref 5–15)
BUN: 80 mg/dL — ABNORMAL HIGH (ref 6–20)
CO2: 12 mmol/L — ABNORMAL LOW (ref 22–32)
Calcium: 7.3 mg/dL — ABNORMAL LOW (ref 8.9–10.3)
Chloride: 99 mmol/L (ref 98–111)
Creatinine, Ser: 18.05 mg/dL — ABNORMAL HIGH (ref 0.44–1.00)
GFR calc Af Amer: 2 mL/min — ABNORMAL LOW
GFR calc non Af Amer: 2 mL/min — ABNORMAL LOW
Glucose, Bld: 313 mg/dL — ABNORMAL HIGH (ref 70–99)
Potassium: 6.9 mmol/L (ref 3.5–5.1)
Sodium: 133 mmol/L — ABNORMAL LOW (ref 135–145)

## 2019-01-18 LAB — BLOOD PRODUCT ORDER (VERBAL) VERIFICATION

## 2019-01-18 MED ORDER — CALCIUM GLUCONATE-NACL 1-0.675 GM/50ML-% IV SOLN: 1.0000 g | Freq: Once | INTRAVENOUS | Status: AC

## 2019-01-18 MED ORDER — PIPERACILLIN-TAZOBACTAM IN DEX 2-0.25 GM/50ML IV SOLN
2.2500 g | Freq: Three times a day (TID) | INTRAVENOUS | Status: DC
  Administered 2019-01-18 – 2019-01-22 (×13): 2.25 g via INTRAVENOUS
  Filled 2019-01-18 (×17): qty 50

## 2019-01-18 MED ORDER — SODIUM CHLORIDE 0.9 % IV BOLUS
250.0000 mL | INTRAVENOUS | Status: DC | PRN
  Administered 2019-01-18: 19:00:00 250 mL via INTRAVENOUS

## 2019-01-18 MED ORDER — SODIUM ZIRCONIUM CYCLOSILICATE 10 G PO PACK
10.0000 g | PACK | Freq: Three times a day (TID) | ORAL | Status: DC
  Administered 2019-01-18 (×3): 10 g via ORAL
  Filled 2019-01-18 (×4): qty 1

## 2019-01-18 MED ORDER — CALCIUM GLUCONATE-NACL 1-0.675 GM/50ML-% IV SOLN
INTRAVENOUS | Status: AC
  Administered 2019-01-18: 1000 mg via INTRAVENOUS
  Filled 2019-01-18: qty 50

## 2019-01-18 MED ORDER — SODIUM BICARBONATE 8.4 % IV SOLN
INTRAVENOUS | Status: AC
  Administered 2019-01-18: 100 meq via INTRAVENOUS
  Filled 2019-01-18: qty 100

## 2019-01-18 MED ORDER — SODIUM BICARBONATE 8.4 % IV SOLN: 50.0000 meq | Freq: Once | INTRAVENOUS | Status: DC

## 2019-01-18 MED ORDER — OXYCODONE-ACETAMINOPHEN 5-325 MG PO TABS
1.0000 | ORAL_TABLET | Freq: Three times a day (TID) | ORAL | Status: DC | PRN
  Administered 2019-01-18 – 2019-01-23 (×10): 1 via ORAL
  Filled 2019-01-18 (×10): qty 1

## 2019-01-18 MED ORDER — SODIUM BICARBONATE 8.4 % IV SOLN
INTRAVENOUS | Status: AC
  Administered 2019-01-18: 50 meq via INTRAVENOUS
  Filled 2019-01-18: qty 50

## 2019-01-18 MED ORDER — DELFLEX-LC/1.5% DEXTROSE 344 MOSM/L IP SOLN: INTRAPERITONEAL | Status: DC

## 2019-01-18 MED ORDER — SODIUM CHLORIDE 0.9% FLUSH
10.0000 mL | Freq: Two times a day (BID) | INTRAVENOUS | Status: DC
  Administered 2019-01-18 – 2019-01-21 (×5): 10 mL

## 2019-01-18 MED ORDER — SODIUM CHLORIDE 0.9% FLUSH: 10.0000 mL | INTRAVENOUS | Status: DC | PRN

## 2019-01-18 MED ORDER — SODIUM BICARBONATE 8.4 % IV SOLN: 50.0000 meq | Freq: Once | INTRAVENOUS | Status: AC

## 2019-01-18 MED ORDER — INSULIN ASPART 100 UNIT/ML IV SOLN
10.0000 [IU] | Freq: Once | INTRAVENOUS | Status: AC
  Administered 2019-01-18: 10 [IU] via INTRAVENOUS

## 2019-01-18 MED ORDER — HEPARIN 1000 UNIT/ML FOR PERITONEAL DIALYSIS: 500.0000 [IU] | INTRAMUSCULAR | Status: DC | PRN

## 2019-01-18 MED ORDER — DEXTROSE 50 % IV SOLN: 1.0000 | Freq: Once | INTRAVENOUS | Status: AC

## 2019-01-18 MED ORDER — STERILE WATER FOR INJECTION IV SOLN
INTRAVENOUS | Status: DC
  Filled 2019-01-18 (×2): qty 850

## 2019-01-18 MED ORDER — DEXTROSE 50 % IV SOLN
INTRAVENOUS | Status: AC
  Administered 2019-01-18: 50 mL via INTRAVENOUS
  Filled 2019-01-18: qty 50

## 2019-01-18 MED ORDER — SODIUM CHLORIDE 0.9% FLUSH: 10.0000 mL | Freq: Two times a day (BID) | INTRAVENOUS | Status: DC

## 2019-01-18 MED ORDER — SODIUM BICARBONATE 8.4 % IV SOLN: 100.0000 meq | Freq: Once | INTRAVENOUS | Status: AC

## 2019-01-18 MED ORDER — GENTAMICIN SULFATE 0.1 % EX CREA
1.0000 "application " | TOPICAL_CREAM | Freq: Every day | CUTANEOUS | Status: DC
  Administered 2019-01-18 – 2019-01-19 (×2): 1 via TOPICAL
  Filled 2019-01-18: qty 15

## 2019-01-18 MED ORDER — SODIUM ZIRCONIUM CYCLOSILICATE 10 G PO PACK
10.0000 g | PACK | Freq: Once | ORAL | Status: AC
  Administered 2019-01-18: 10 g via ORAL
  Filled 2019-01-18: qty 1

## 2019-01-18 NOTE — Progress Notes (Signed)
Inpatient Diabetes Program Recommendations  AACE/ADA: New Consensus Statement on Inpatient Glycemic Control (2015)  Target Ranges:  Prepandial:   less than 140 mg/dL      Peak postprandial:   less than 180 mg/dL (1-2 hours)      Critically ill patients:  140 - 180 mg/dL   Lab Results  Component Value Date   GLUCAP 327 (H) 01/18/2019   HGBA1C 7.2 (H) 01/17/2019    Review of Glycemic Control Results for JESSICALEE, CIAMPA (MRN [id number]) as of 01/18/2019 13:42  Ref. Range 01/17/2019 20:45 01/17/2019 22:14 01/18/2019 06:29 01/18/2019 11:30  Glucose-Capillary Latest Ref Range: 70 - 99 mg/dL 184 (H) 291 (H) 271 (H) 327 (H)   Diabetes history: DM 2 Outpatient Diabetes medications: None Current orders for Inpatient glycemic control:  Novolog sensitive tid with meals and HS Inpatient Diabetes Program Recommendations:    Note patient did receive Decadron 10 mg x1 on 12/29 likely contributing to elevated blood sugars.    May consider adding low dose basal insulin at this time.  Consider Levemir 8 units bid if blood sugars continue>200 mg/dL.    Thanks  Adah Perl, RN, BC-ADM Inpatient Diabetes Coordinator Pager [phone] (8a-5p)

## 2019-01-18 NOTE — Plan of Care (Signed)
Had R/P bleed post intervention for CAD> High risk PCI- recommendation was CABG. Patient elected to PCI. Right fem cutdown, washout of hematoma. Stitched artery by vascular.  Patient acidotic, metabolic r/t ESRD   Nsg Dx Electrolyte imbalance r/t kidney failure Maintain PD cyclic Monitor Potassium levels Q4h as ordered Hemodynamic parameters to wean vasopressors as able Give fluid back if needed based on CVP, monitor CVP for changes.   Monitor blood glucose levels, new onset DM?

## 2019-01-18 NOTE — Progress Notes (Addendum)
Progress Note  Patient Name: Brenda Davis Date of Encounter: 01/18/2019  Primary Cardiologist: Skeet Latch, MD   Subjective   Yesterday underwent PCI to RCA and ramus intermedius. Post cath became hypotensive (but not tachycardic).  CT abdomen showed large retroperitoneal hemorrhage, vascular surgery consulted and Dr. Donzetta Matters took the patient to the OR for surgical repair and drainage of the hematoma.--It appeared that the sheath was going in and out of the arteriotomy with bleeding around the arteriotomy site.  The sheath actually been dislodged from the arteriotomy during the initial stages of surgery.  Both the current and old arteriotomy sites were repaired in the retroperitoneal hemorrhage was drained.  Patient remained hypotensive overnight and developed severe mixed metabolic and respiratory acidosis, has been maintained on Levophed and vasopressin.  For acute combined hypoxic and hypercapnic restaurant failure was placed on BiPAP.  Was given insulin glucose for hyperkalemia.  Placed on peritoneal dialysis.  Started on bicarb drip.  Currently remains on BiPAP, blood pressure is more stable.  -Antihypertensives being held.  Was able to be off BiPAP temporarily to eat breakfast. Notes chest wall tenderness.  Still a bit lethargic.  Inpatient Medications    Scheduled Meds: . aspirin EC  81 mg Oral Daily  . calcium acetate  667 mg Oral TID WC  . Chlorhexidine Gluconate Cloth  6 each Topical Daily  . cinacalcet  90 mg Oral Q supper  . ferric citrate  210 mg Oral TID WC  . folic acid  1 mg Oral Daily  . gabapentin  100 mg Oral BID  . gentamicin cream  1 application Topical Daily  . heparin  5,000 Units Subcutaneous Q8H  . insulin aspart  0-5 Units Subcutaneous QHS  . insulin aspart  0-9 Units Subcutaneous TID WC  . mouth rinse  15 mL Mouth Rinse BID  . nitroGLYCERIN  0.5 inch Topical Q6H  . rosuvastatin  10 mg Oral Daily  . sodium chloride flush  10-40 mL  Intracatheter Q12H  . sodium chloride flush  3 mL Intravenous Once  . sodium chloride flush  3 mL Intravenous Q12H  . ticagrelor  90 mg Oral BID   Continuous Infusions: . sodium chloride    . dialysis solution 1.5% low-MG/low-CA    . dialysis solution 2.5% low-MG/low-CA    . norepinephrine (LEVOPHED) Adult infusion 16 mcg/min (01/18/19 0700)  . piperacillin-tazobactam (ZOSYN)  IV Stopped (01/18/19 0503)  .  sodium bicarbonate (isotonic) infusion in sterile water 125 mL/hr at 01/18/19 0700  . sodium chloride    . vasopressin (PITRESSIN) infusion - *FOR SHOCK* 0.03 Units/min (01/18/19 0700)   PRN Meds: sodium chloride, acetaminophen, acetaminophen, fentaNYL (SUBLIMAZE) injection, gentamicin cream, dianeal solution for CAPD/CCPD with heparin, dianeal solution for CAPD/CCPD with heparin, nitroGLYCERIN, ondansetron (ZOFRAN) IV, sodium chloride, sodium chloride flush, sodium chloride flush   Vital Signs    Vitals:   01/18/19 0700 01/18/19 0702 01/18/19 0803 01/18/19 0845  BP:  119/60  (!) 151/74  Pulse: 89 91  81  Resp: 13 (!) 26  16  Temp:  98 F (36.7 C) 98 F (36.7 C)   TempSrc:  Axillary Oral   SpO2: (!) 85% 92%  97%  Weight:  (!) 138.7 kg    Height:        Intake/Output Summary (Last 24 hours) at 01/18/2019 0933 Last data filed at 01/18/2019 0700 Gross per 24 hour  Intake 2608.24 ml  Output 100 ml  Net 2508.24 ml   Last 3  Weights 01/18/2019 01/18/2019 01/17/2019  Weight (lbs) 305 lb 12.5 oz 302 lb 11.1 oz 292 lb 5.3 oz  Weight (kg) 138.7 kg 137.3 kg 132.6 kg      Telemetry    Mostly sinus rhythm, rate set been in the 60s to 90s.  Some ectopy noted.- Personally Reviewed  ECG    Sinus rhythm, rate 91 with nonspecific ST and T wave changes.  T waves remain somewhat peaked, but reduced from overnight.- Personally Reviewed  Physical Exam   General appearance: Is awake and alert, RN indicated that she she was able to tolerate some applesauce this morning.  Still a bit  lethargic, and on BiPAP Neck:  Very difficult to assess for any JVD.  Due to body habitus. Lungs: Air leak with BiPAP makes it very difficult to hear breath sounds, somewhat diminished sounds throughout.  Difficult to assess due to body habitus and lack of effort. Heart: Very distant heart sounds.  Otherwise RRR with normal S1 and S2.Marland Kitchen  No M/R/G. Abdomen: Obese, soft, mild tenderness more up in the rib cage actually.  Unable to assess HSM. Extremities: No obvious edema.  Mild excoriations and small well-healed ulcers on the ankles. Pulses: Warm feet with palpable pulses bilaterally. Neurologic: Grossly normal ; Seems somewhat lethargic.   Labs    High Sensitivity Troponin:   Recent Labs  Lab 01/15/19 1448 01/15/19 1806  TROPONINIHS 43* 781*      Chemistry Recent Labs  Lab 01/15/19 1448 01/17/19 0404 01/17/19 1806 01/17/19 2209 01/18/19 0117 01/18/19 0420 01/18/19 0425 01/18/19 0837  NA 136 137 133*  --  133* 136 133* 131*  K 4.4 5.3* 4.8  --  6.9* 6.0* 5.9* 6.2*  CL 95* 95* 99  --  99 96*  --   --   CO2 19* 22  --   --  12* 15*  --   --   GLUCOSE 246* 183* 249*  --  313* 330*  --   --   BUN 69* 75* 73*  --  80* 78*  --   --   CREATININE 15.95* 17.21* 17.40* 17.28* 18.05* 17.26*  --   --   CALCIUM 8.2* 7.6*  --   --  7.3* 7.4*  --   --   PROT 7.1  --   --   --   --   --   --   --   ALBUMIN 3.5  --   --   --   --  2.5*  --   --   AST 15  --   --   --   --   --   --   --   ALT 17  --   --   --   --   --   --   --   ALKPHOS 98  --   --   --   --   --   --   --   BILITOT 0.3  --   --   --   --   --   --   --   GFRNONAA 2* 2*  --  2* 2* 2*  --   --   GFRAA 3* 2*  --  2* 2* 2*  --   --   ANIONGAP 22* 20*  --   --  22* 25*  --   --      Hematology Recent Labs  Lab 01/17/19 0404 01/17/19 2209 01/18/19 0420 01/18/19 0425 01/18/19  0837  WBC 9.8 29.9* 35.9*  --   --   RBC 3.20* 3.60* 3.35*  --   --   HGB 11.0* 12.0 10.9* 11.2* 10.5*  HCT 33.1* 36.1 32.6* 33.0* 31.0*    MCV 103.4* 100.3* 97.3  --   --   MCH 34.4* 33.3 32.5  --   --   MCHC 33.2 33.2 33.4  --   --   RDW 12.8 16.8* 17.5*  --   --   PLT 310 315 302  --   --     BNPNo results for input(s): BNP, PROBNP in the last 168 hours.   DDimer No results for input(s): DDIMER in the last 168 hours.   Radiology    CT ABDOMEN PELVIS WO CONTRAST  Result Date: 01/17/2019 CLINICAL DATA:  Abdominal and groin pain status post cardiac catheterization yesterday. EXAM: CT ABDOMEN AND PELVIS WITHOUT CONTRAST TECHNIQUE: Multidetector CT imaging of the abdomen and pelvis was performed following the standard protocol without IV contrast. COMPARISON:  CT abdomen pelvis dated April 18, 2018. FINDINGS: Lower chest: No acute abnormality. Subsegmental atelectasis in both lower lobes and lingula. 4 mm ground-glass nodule in the right middle lobe. Hepatobiliary: Subtle nodular contour of the left hepatic lobe again noted. No focal liver abnormality. Status post cholecystectomy. No biliary dilatation. Pancreas: Unremarkable. No pancreatic ductal dilatation or surrounding inflammatory changes. Spleen: Normal in size without focal abnormality. Adrenals/Urinary Tract: The adrenal glands are unremarkable. Unchanged bilateral renal atrophy with trace residual contrast in both collecting systems. No hydronephrosis. The bladder is decompressed and contains excreted contrast. Stomach/Bowel: Unchanged small hiatal hernia. Postsurgical changes related to prior gastric sleeve resection again noted. No bowel wall thickening, distention, or surrounding inflammatory changes. Mild left-sided colonic diverticulosis. Normal appendix. Vascular/Lymphatic: Aortoiliac and branch vessel atherosclerosis. No enlarged abdominal or pelvic lymph nodes. Reproductive: Uterus and bilateral adnexa are unremarkable. Other: Right groin arterial sheath with the tip adjacent to but not clearly within the junction of the right external iliac and common femoral arteries.  Large right-sided retroperitoneal hematoma measuring approximately 10.0 x 15.4 x 24.9 cm (AP by transverse by CC). Tiny foci of pneumoperitoneum overlying the liver likely related to peritoneal dialysis catheter. Musculoskeletal: No acute or significant osseous findings. IMPRESSION: 1. Large right-sided retroperitoneal hematoma measuring approximately 10.0 x 15.4 x 24.9 cm. 2. Right groin arterial sheath with the tip adjacent to but not clearly within the junction of the right external iliac and common femoral arteries. 3. Subtle nodular contour of the left hepatic lobe again noted, suspicious for cirrhosis. 4. Tiny foci of pneumoperitoneum again noted, likely related to peritoneal dialysis. 5.  Aortic atherosclerosis (ICD10-I70.0). Electronically Signed   By: Titus Dubin M.D.   On: 01/17/2019 18:10   CARDIAC CATHETERIZATION  Addendum Date: 01/17/2019    Mid LAD lesion is 75% stenosed. The LAD was not addressed and will be managed medically.  Ost LAD to Mid LAD lesion is 40% stenosed.  Mid Cx to Dist Cx lesion is 55% stenosed with 40% stenosed side branch in Ost 3rd Mrg.  Prox RCA lesion is 75% stenosed.  A drug-eluting stent was successfully placed using a SYNERGY XD 3.0X38, postdilated to 3.5 mm.  Post intervention, there is a 0% residual stenosis.  Ost 1st Mrg lesion is 99% stenosed. EBU4 Guide catheter used.  A drug-eluting stent was successfully placed using a SYNERGY XD 2.25X24, postdilated to 2.75 mm.  Post intervention, there is a 0% residual stenosis.  Continue DAPT for 12 months.  Would recommend clopidogrel long-term after one year since she has such diffuse disease.  Continue aggressive secondary prevention.   Result Date: 01/17/2019  Mid LAD lesion is 75% stenosed. The LAD was not addressed and will be managed medically.  Ost LAD to Mid LAD lesion is 40% stenosed.  Mid Cx to Dist Cx lesion is 55% stenosed with 40% stenosed side branch in Ost 3rd Mrg.  Prox RCA lesion is 75%  stenosed.  A drug-eluting stent was successfully placed using a SYNERGY XD 3.0X38, postdilated to 3.5 mm.  Post intervention, there is a 0% residual stenosis.  Ost 1st Mrg lesion is 99% stenosed. EBU4 Guide catheter used.  A drug-eluting stent was successfully placed using a SYNERGY XD 2.25X24, postdilated to 2.75 mm.  Post intervention, there is a 0% residual stenosis.  Continue DAPT for 12 months.  Would recommend clopidogrel long-term after one year since she has such diffuse disease.  Continue aggressive secondary prevention.   CARDIAC CATHETERIZATION  Result Date: 01/16/2019  Mid LAD lesion is 75% stenosed. The LAD is diffusely diseased and heavily calcified.  Mid Cx to Dist Cx lesion is 55% stenosed with 40% stenosed side branch in Ost 3rd Mrg.  Prox RCA lesion is 75% stenosed. This disease appears to have progressed.  Ost 1st Mrg-1 lesion is 50% stenosed.  Ost 1st Mrg-2 lesion is 99% stenosed. This lesion is significantly worse than prior.  The left ventricular systolic function is normal.  LV end diastolic pressure is normal.  The left ventricular ejection fraction is 55-65% by visual estimate.  There is no aortic valve stenosis.  Ost LAD to Mid LAD lesion is 40% stenosed.  Severe multivessel disease with progression of calcific disease in the mid LAD, proximal RCA and tortuous mid OM1.  Plan for surgical consult.    DG CHEST PORT 1 VIEW  Result Date: 01/17/2019 CLINICAL DATA:  Central line placement. EXAM: PORTABLE CHEST 1 VIEW COMPARISON:  Chest x-ray dated January 15, 2019. FINDINGS: New right internal jugular central venous catheter with tip at the cavoatrial junction. The heart size and mediastinal contours are within normal limits. Normal pulmonary vascularity. Minimal bibasilar atelectasis. No focal consolidation, pleural effusion, or pneumothorax. No acute osseous abnormality. IMPRESSION: New right internal jugular central venous catheter without complicating feature.  Electronically Signed   By: Titus Dubin M.D.   On: 01/17/2019 21:32   ECHOCARDIOGRAM COMPLETE  Result Date: 01/16/2019   ECHOCARDIOGRAM REPORT   Patient Name:   LYRIS HITCHMAN Date of Exam: 01/16/2019 Medical Rec #:  [id number]                 Height:       65.0 in Accession #:    [id number]                Weight:       289.5 lb Date of Birth:  [birth year]                BSA:          2.31 m Patient Age:    31 years                  BP:           116/73 mmHg Patient Gender: F                         HR:           69  bpm. Exam Location:  Inpatient Procedure: 2D Echo, Color Doppler, Cardiac Doppler and Intracardiac            Opacification Agent Indications:    Acute MI  History:        Patient has prior history of Echocardiogram examinations, most                 recent 01/04/2015. CAD; Risk Factors:Diabetes, Hypertension and                 Dyslipidemia. ESRD.  Sonographer:    Raquel Sarna Senior RDCS Referring Phys: [phone] [facility] MARTIN  Sonographer Comments: Technically difficult study due to poor echo windows. IMPRESSIONS  1. Left ventricular ejection fraction, by visual estimation, is 65 to 70%. The left ventricle has hyperdynamic function. There is moderately increased left ventricular hypertrophy.  2. Definity contrast agent was given IV to delineate the left ventricular endocardial borders.  3. Left ventricular diastolic parameters are consistent with Grade I diastolic dysfunction (impaired relaxation).  4. The left ventricle has no regional wall motion abnormalities.  5. Global right ventricle has normal systolic function.The right ventricular size is mildly enlarged. No increase in right ventricular wall thickness.  6. Left atrial size was normal.  7. Right atrial size was normal.  8. The pericardial effusion is posterior to the left ventricle.  9. Trivial pericardial effusion is present. 10. The mitral valve was not well visualized. Trivial mitral valve regurgitation. 11. The tricuspid  valve is not well visualized. 12. The aortic valve is tricuspid. Aortic valve regurgitation is not visualized. Mild aortic valve sclerosis without stenosis. 13. The pulmonic valve was grossly normal. Pulmonic valve regurgitation is not visualized. 14. The inferior vena cava is normal in size with <50% respiratory variability, suggesting right atrial pressure of 8 mmHg. 15. The interatrial septum was not well visualized. FINDINGS  Left Ventricle: Left ventricular ejection fraction, by visual estimation, is 65 to 70%. The left ventricle has hyperdynamic function. Definity contrast agent was given IV to delineate the left ventricular endocardial borders. The left ventricle has no regional wall motion abnormalities. There is moderately increased left ventricular hypertrophy. Left ventricular diastolic parameters are consistent with Grade I diastolic dysfunction (impaired relaxation). Indeterminate filling pressures. Right Ventricle: The right ventricular size is mildly enlarged. No increase in right ventricular wall thickness. Global RV systolic function is has normal systolic function. Left Atrium: Left atrial size was normal in size. Right Atrium: Right atrial size was normal in size Pericardium: Trivial pericardial effusion is present. The pericardial effusion is posterior to the left ventricle. Mitral Valve: The mitral valve was not well visualized. Trivial mitral valve regurgitation. Tricuspid Valve: The tricuspid valve is not well visualized. Tricuspid valve regurgitation is not demonstrated. Aortic Valve: The aortic valve is tricuspid. Aortic valve regurgitation is not visualized. Mild aortic valve sclerosis is present, with no evidence of aortic valve stenosis. Pulmonic Valve: The pulmonic valve was grossly normal. Pulmonic valve regurgitation is not visualized. Pulmonic regurgitation is not visualized. Aorta: The aortic root and ascending aorta are structurally normal, with no evidence of dilitation. Venous:  The inferior vena cava is normal in size with less than 50% respiratory variability, suggesting right atrial pressure of 8 mmHg. IAS/Shunts: The interatrial septum was not well visualized.  LEFT VENTRICLE PLAX 2D LVIDd:         3.84 cm  Diastology LVIDs:         2.29 cm  LV e' lateral:   5.11 cm/s LV PW:  1.25 cm  LV E/e' lateral: 17.2 LV IVS:        1.27 cm  LV e' medial:    5.11 cm/s LVOT diam:     2.00 cm  LV E/e' medial:  17.2 LV SV:         46 ml LV SV Index:   18.01 LVOT Area:     3.14 cm  RIGHT VENTRICLE RV S prime:     12.40 cm/s TAPSE (M-mode): 1.5 cm LEFT ATRIUM             Index       RIGHT ATRIUM           Index LA diam:        4.20 cm 1.81 cm/m  RA Area:     20.50 cm LA Vol (A2C):   56.6 ml 24.45 ml/m RA Volume:   66.70 ml  28.81 ml/m LA Vol (A4C):   40.1 ml 17.32 ml/m LA Biplane Vol: 49.5 ml 21.38 ml/m  AORTIC VALVE LVOT Vmax:   78.30 cm/s LVOT Vmean:  53.400 cm/s LVOT VTI:    0.152 m  AORTA Ao Root diam: 2.70 cm Ao Asc diam:  3.30 cm MITRAL VALVE MV Area (PHT): 2.60 cm              SHUNTS MV PHT:        84.68 msec            Systemic VTI:  0.15 m MV Decel Time: 292 msec              Systemic Diam: 2.00 cm MV E velocity: 87.80 cm/s  103 cm/s MV A velocity: 102.00 cm/s 70.3 cm/s MV E/A ratio:  0.86        1.5  Lyman Bishop MD Electronically signed by Lyman Bishop MD Signature Date/Time: 01/16/2019/10:43:37 AM    Final     Cardiac Studies   Diagnostic      Intervention            Patient Profile     53 y.o. female with a history of known moderate CAD in the past by catheterization (last in June 2020) with chronic chest pain initially seen for evaluation of chest pain and evaluate troponin/non-STEMI at the request of [facility]. On cardiac catheterization was found to have severe ramus intermedius disease and progression of disease to moderate-severe RCA disease along with extensive disease in the LAD.  Was offered CABG, however the patient chose to undergo PCI of RCA and RI/OM1 with  medical management of the LAD due to extensive calcification.  Underwent staged PCI of the RCA and RI/OM1 on 01/17/2019.  The case went relatively smoothly, however postop became hypotensive and found to have acute retroperitoneal hematoma/hemorrhage and taken to the OR.  Decompensated overnight with continuous hypotension and worsening combined metabolic and respiratory acidosis along with acute hypercapnic/hypoxic respiratory failure.  Placed on BiPAP, started on peritoneal dialysis, blood pressure maintained with Levophed and vasopressin.  Assessment & Plan    Principal Problem:   NSTEMI (non-ST elevated myocardial infarction) ([place]) Active Problems:   Retroperitoneal hemorrhage - post Cath/PCI - taken to OR for drainage & CFA repair   Acute blood loss anemia - resolved, transfused in OR - Hgb stable   Increased anion gap metabolic acidosis   Shock circulatory (HCC) - secondary to acute blood loss anemia, elevated WBC (? sepsis vs post-op inflammation).h   Acute respiratory failure with hypoxia and hypercapnia (HCC)   Dyslipidemia,  goal LDL below 70   Morbid obesity with BMI of 40.0-44.9, adult (HCC)   S/P laparoscopic sleeve gastrectomy   ESRD on peritoneal dialysis (HCC)   DM (diabetes mellitus), type 2 with renal complications (HCC)   Hepatic cirrhosis (HCC)   Coronary artery disease involving native coronary artery of native heart with unstable angina pectoris (HCC)   Postprocedural hypotension   Hyperkalemia  Principal Problem:   NSTEMI (non-ST elevated myocardial infarction) ([place])--  Coronary artery disease involving native coronary artery of native heart with unstable angina pectoris (HCC)--two-vessel PCI  Status post DES PCI of the circumflex-OM and RCA.  Obviously beta-blocker on hold because of shock.  Hemodynamic support with vasopressors  Remains on aspirin and Brilinta, no active bleeding  On renal dose statin (rosuvastatin), anticipate that she will likely need  PCSK9 inhibitor.  Active Problems:   Retroperitoneal hemorrhage - post Cath/PCI - taken to OR for drainage & CFA repair /Acute blood loss anemia - resolved, transfused in OR - Hgb stable  Appreciate prompt attention by vascular surgical team. ->  Plans to take down dressing tomorrow.  No signs of active bleeding.  Hemoglobin and maintaining stable levels.      Shock circulatory ([place]) - secondary to acute blood loss anemia, elevated WBC (? sepsis vs post-op inflammation).  Currently on vasopressin and Levophed, blood pressures look much better now.  Would probably start weaning down vasopressor support.  Beta-blocker on hold    Acute respiratory failure with hypoxia and hypercapnia (HCC) -management PCCM.    Currently on BiPAP.  Following ABGs.->  PCO2 looks much better.  Anticipate that we potentially can stop BiPAP unless she is sleeping.    Dyslipidemia, goal LDL below 70 -  converted to (renal dose) rosuvastatin in the outpatient, likely needs PCSK9 inhibitor.    ESRD on peritoneal dialysis (HCC)/  Increased anion gap metabolic acidosis -      Hyperkalemia -  on BiPAP for respiratory acidosis and peritoneal dialysis for metabolic, likely exacerbated by hypotension/shock  Potassium remained 6.2, no significant EKG changes noted.  We will need to address with dialysis.  Now being followed by nephrology.  Seen by Dr. Jonnie Finner    DM (diabetes mellitus), type 2 with renal complications ([place]) -per medicine service   Hepatic cirrhosis ([place]) - not addressed (defer to [facility])    Morbid obesity with BMI of 40.0-44.9, adult ([place]) -> likely contributing somewhat to respiratory failure (cannot exclude component of obesity hypoventilation)      The patient is critically ill with multiple organ systems failure and requires high complexity decision making for assessment and support, frequent evaluation and titration of therapies, application of advanced monitoring technologies and extensive  interpretation of multiple databases.  Critical care time - 40 mins. This represents my time independent of the other providers time taking care of the pt.  For questions or updates, please contact [place] Please consult [email or web address] for contact info under        Signed, Glenetta Hew, MD  01/18/2019, 9:33 AM

## 2019-01-18 NOTE — Progress Notes (Signed)
Has been on cycler all day for PD.fluid drained is light yellow in color.  Last K level 5.9.  Weaning off levophed slowly. Gave 500 ml bolus of NS this afternoon. CVP still low 5 range.  Patient very sleepy but complaining of pain, refractory to tylenol. Orders received for percocet. Given to patient.  Pain is mostly lower abdomen. Right femoral area soft with old drainage.

## 2019-01-18 NOTE — Progress Notes (Signed)
K level 5.3  q4 H labs d/c per order. Patient continues somnolent, off levophed now BP 120 SBP continues on vasopressin. Will awake to command, CVP 2-3 bolus given . Continues on cycler for PD

## 2019-01-18 NOTE — Consult Note (Signed)
NAME:  Brenda Davis, MRN:  [id number], DOB:  10-23-1965, LOS: 3 ADMISSION DATE:  01/15/2019, CONSULTATION DATE: 12/30 REFERRING MD: Dr. Maylene Roes, CHIEF COMPLAINT: Acidosis  Brief History   53 year old female admitted on 12/27 with NSTEMI.  Cardiac cath complicated by retroperitoneal hemorrhage.  Taken to the OR for repair on 12/29.  Postoperatively with metabolic acidosis and shock requiring vasopressors.  History of present illness   53 year old female with past medical history as below, which is significant for ESRD on peritoneal dialysis, coronary artery disease, hypertension, hyperlipidemia, and type 2 diabetes.  She was admitted to [facility] on 12/27 with complaints of fatigue and decreased exercise tolerance.  She then developed chest pain that resolved with nitroglycerin, until it did not improve with nitroglycerin on 12/27.  High-sensitivity troponin was noted to be 700 in the emergency department and cardiology was consulted.  She was taken to the cardiac Cath Lab on 12/29 and had drug-eluting stents placed to both the proximal RCA and the first marginal.  Cardiac catheterization was complicated by retroperitoneal bleed causing hemodynamic instability.  Vascular surgery was consulted and took the patient to the OR for repair in the evening hours of 12/30.  She came out of the OR on Levophed and vasopressin for blood pressure support and was transferred to ICU.  In the early a.m. hours of 12/30 she remained hypotensive despite pressors.  ABG determined profound metabolic acidosis and PCCM was consulted.  Past Medical History   has a past medical history of Anemia, CAD (coronary artery disease), ESRD on dialysis ([place]), Gout, HCAP (healthcare-associated pneumonia) (05/06/2016), History of blood transfusion, Hyperlipidemia, Hypertension, Morbid obesity ([place]), Pain, Palpitations (09/24/2016), Peritonitis, dialysis-associated ([place]), and Type II diabetes mellitus ([place]).   Significant  Hospital Events   12/27 admit for NSTEMI 12/28 LHC showed 3 vessel CAD.  12/29 patient opted for stents over CABG, LHC with DES x 2 placed. Complicated by RP bleed. Taken to OR 12/30 Progressive shock, acidosis.   Consults:  Cardiology CVTS Nephrology  Procedures:  12/28 LHC (3 vessel CAD) 12/29 LHC DES x2 to RCA and ost 1st mrg.  12/30 BiPAP started for hypercarbic failure.   Significant Diagnostic Tests:  CT abd 12/29 > Large right-sided retroperitoneal hematoma measuring approximately 10.0 x 15.4 x 24.9 cm. Right groin arterial sheath with the tip adjacent to but not clearly within the junction of the right external iliac and common femoral arteries.  Micro Data:  SARS COV2 12/27 > Neg   Antimicrobials:    Interim history/subjective:    Objective   Blood pressure 94/65, pulse 74, temperature 97.7 F (36.5 C), resp. rate 16, height 5' 5"  (1.651 m), weight 132.6 kg, SpO2 100 %.        Intake/Output Summary (Last 24 hours) at 01/18/2019 0256 Last data filed at 01/18/2019 0200 Gross per 24 hour  Intake 2082.59 ml  Output 100 ml  Net 1982.59 ml   Filed Weights   01/15/19 2346 01/16/19 1800 01/17/19 0735  Weight: 131.3 kg 132.8 kg 132.6 kg    Examination: General: obese middle aged female in NAD HENT: [place]/AT, PERRL, no JVD Lungs: Clear bilateral breath sounds Cardiovascular: RRR, no MRG. Tender chest wall.  Abdomen: Soft, mildly tender Extremities: no acute deformity or ROM limitation Neuro: Alert, oriented, non-focal  Resolved Hospital Problem list     Assessment & Plan:   Shock: hemorrhagic vs cardiogenic vs septic. No clear source of infection. Leukocytosis with WBC upt o 29.9. S/p 2 units PRBC 12/29.  Improves with bicarb. - Levophed to keep MAP > 65mHg - Shock dose vasopressin.  - Trend hemoglobin.  - CVP 2, will start IVF - Pan culture and start empiric Zosyn with low threshold to DC.   Acute hypercarbic respiratory failure: suspect she is  hypoventilating due to chest wall pain.  - BiPAP now - ABG one hour  Anion gap acidosis: likely renal failure +/- lactic acidosis in the setting of shock.  - lactic acid pending - sodium bicarbonate infusion temporarily - nephrology is following and HD RN has been contacted to consider PD. May need CRRT.   Hyperkalemia: given bicarb, calcium, insulin to temporize - Lokelma now - Bicarb infusion - Repeat BMP - pending PD.   CAD: Candidate for CABG, she has elected to try PCI first, which was done on 12/29.  - Cardiology, CVTS following  Retroperitoneal hematoma: Complication of LHC 176/72 To OR by Dr CDonzetta Mattersfor repair. s/p 2 units PRBC. Hemoglobin 12 now.  - Trend hemoglobin - Transfuse for Hgb < 8 - Vascular surgery following.    Best practice:  Diet: NPO except sips wth meds Pain/Anxiety/Delirium protocol (if indicated): NA VAP protocol (if indicated): NA DVT prophylaxis: SQH GI prophylaxis: NA Glucose control: CBG monitoring and SSI Mobility: BR Code Status: FULL Family Communication: Patient updated Disposition: ICU  Labs   CBC: Recent Labs  Lab 01/15/19 1448 01/16/19 0449 01/17/19 0404 01/17/19 2019 01/17/19 2209 01/18/19 0056  WBC 10.8* 10.2 9.8  --  29.9*  --   HGB 11.5* 12.3 11.0* 10.2* 12.0 12.6  HCT 36.4 37.6 33.1* 30.0* 36.1 37.0  MCV 106.4* 103.3* 103.4*  --  100.3*  --   PLT 351 335 310  --  315  --     Basic Metabolic Panel: Recent Labs  Lab 01/15/19 1448 01/16/19 0449 01/17/19 0404 01/17/19 2019 01/17/19 2209 01/18/19 0056 01/18/19 0117  NA 136 139 137 133*  --  132* 133*  K 4.4 5.4* 5.3* 4.9  --  6.8* 6.9*  CL 95* 94* 95*  --   --   --  99  CO2 19* 22 22  --   --   --  12*  GLUCOSE 246* 126* 183*  --   --   --  313*  BUN 69* 77* 75*  --   --   --  80*  CREATININE 15.95* 17.25* 17.21*  --  17.28*  --  18.05*  CALCIUM 8.2* 8.2* 7.6*  --   --   --  7.3*   GFR: Estimated Creatinine Clearance: 4.9 mL/min (A) (by C-G formula based on SCr  of 18.05 mg/dL (H)). Recent Labs  Lab 01/15/19 1448 01/16/19 0449 01/17/19 0404 01/17/19 2209  WBC 10.8* 10.2 9.8 29.9*    Liver Function Tests: Recent Labs  Lab 01/15/19 1448  AST 15  ALT 17  ALKPHOS 98  BILITOT 0.3  PROT 7.1  ALBUMIN 3.5   No results for input(s): LIPASE, AMYLASE in the last 168 hours. No results for input(s): AMMONIA in the last 168 hours.  ABG    Component Value Date/Time   PHART 7.083 (LL) 01/18/2019 0056   PCO2ART 52.6 (H) 01/18/2019 0056   PO2ART 196.0 (H) 01/18/2019 0056   HCO3 15.9 (L) 01/18/2019 0056   TCO2 17 (L) 01/18/2019 0056   ACIDBASEDEF 14.0 (H) 01/18/2019 0056   O2SAT 99.0 01/18/2019 0056     Coagulation Profile: No results for input(s): INR, PROTIME in the last 168 hours.  Cardiac Enzymes:  No results for input(s): CKTOTAL, CKMB, CKMBINDEX, TROPONINI in the last 168 hours.  HbA1C: Hemoglobin A1C  Date/Time Value Ref Range Status  07/27/2017 12:00 AM 6.8  Final  05/24/2017 12:00 AM 5.8  Final   Hgb A1c MFr Bld  Date/Time Value Ref Range Status  01/17/2019 04:04 AM 7.2 (H) 4.8 - 5.6 % Final    Comment:    (NOTE) Pre diabetes:          5.7%-6.4% Diabetes:              >6.4% Glycemic control for   <7.0% adults with diabetes   01/16/2019 04:49 AM 8.0 (H) 4.8 - 5.6 % Final    Comment:    (NOTE) Pre diabetes:          5.7%-6.4% Diabetes:              >6.4% Glycemic control for   <7.0% adults with diabetes     CBG: Recent Labs  Lab 01/17/19 0819 01/17/19 1124 01/17/19 1720 01/17/19 2045 01/17/19 2214  GLUCAP 156* 124* 155* 184* 291*    Review of Systems:   Bolds are positive  Constitutional: weight loss, gain, night sweats, Fevers, chills, fatigue .  HEENT: headaches, Sore throat, sneezing, nasal congestion, post nasal drip, Difficulty swallowing, Tooth/dental problems, visual complaints visual changes, ear ache CV:  chest pain, radiates:,Orthopnea, PND, swelling in lower extremities, dizziness,  palpitations, syncope.  GI  heartburn, indigestion, abdominal pain, nausea, vomiting, diarrhea, change in bowel habits, loss of appetite, bloody stools.  Resp: cough, productive: , hemoptysis, dyspnea, chest wall pain (reproducable), pleuritic.  Skin: rash or itching or icterus GU: dysuria, change in color of urine, urgency or frequency. flank pain, hematuria  MS: joint pain or swelling. decreased range of motion  Psych: change in mood or affect. depression or anxiety.  Neuro: difficulty with speech, weakness, numbness, ataxia    Past Medical History  She,  has a past medical history of Anemia, CAD (coronary artery disease), ESRD on dialysis ([place]), Gout, HCAP (healthcare-associated pneumonia) (05/06/2016), History of blood transfusion, Hyperlipidemia, Hypertension, Morbid obesity ([place]), Pain, Palpitations (09/24/2016), Peritonitis, dialysis-associated ([place]), and Type II diabetes mellitus ([place]).   Surgical History    Past Surgical History:  Procedure Laterality Date  . AV FISTULA PLACEMENT Left 01/03/2015   Procedure: BRACHIAL CEPHALIC ARTERIOVENOUS  FISTULA CREATION LEFT ARM;  Surgeon: Angelia Mould, MD;  Location: [place];  Service: Vascular;  Laterality: Left;  . CARPAL TUNNEL RELEASE Right   . [place]  . CHOLECYSTECTOMY  09/14/2017  . ESOPHAGOGASTRODUODENOSCOPY N/A 09/04/2012   Procedure: ESOPHAGOGASTRODUODENOSCOPY (EGD);  Surgeon: Shann Medal, MD;  Location: [facility];  Service: General;  Laterality: N/A;  PF  . ESOPHAGOGASTRODUODENOSCOPY (EGD) WITH ESOPHAGEAL DILATION N/A 09/29/2012   Procedure: ESOPHAGOGASTRODUODENOSCOPY (EGD) WITH ESOPHAGEAL DILATION;  Surgeon: Milus Banister, MD;  Location: [facility];  Service: Endoscopy;  Laterality: N/A;  . INSERTION OF DIALYSIS CATHETER N/A 01/03/2015   Procedure: INSERTION OF DIALYSIS CATHETER RIGHT INTERNAL JUGULAR;  Surgeon: Angelia Mould, MD;  Location: [facility];  Service: Vascular;  Laterality: N/A;  .  LAPAROSCOPIC GASTRIC SLEEVE RESECTION N/A 07/19/2012   Procedure: LAPAROSCOPIC SLEEVE GASTRECTOMY with EGD;  Surgeon: Madilyn Hook, DO;  Location: [facility];  Service: General;  Laterality: N/A;  laparoscopic sleeve gastrectomy with EGD  . LEFT HEART CATH AND CORONARY ANGIOGRAPHY N/A 02/25/2018   Procedure: LEFT HEART CATH AND CORONARY ANGIOGRAPHY;  Surgeon: Leonie Man, MD;  Location: [facility]  INVASIVE CV LAB;  Service: Cardiovascular;  Laterality: N/A;  . LEFT HEART CATH AND CORONARY ANGIOGRAPHY N/A 06/20/2018   Procedure: LEFT HEART CATH AND CORONARY ANGIOGRAPHY;  Surgeon: Nelva [place], MD;  Location: [place] CV LAB;  Service: Cardiovascular;  Laterality: N/A;  . LEFT HEART CATH AND CORONARY ANGIOGRAPHY N/A 01/16/2019   Procedure: LEFT HEART CATH AND CORONARY ANGIOGRAPHY;  Surgeon: Jettie Booze, MD;  Location: [place] CV LAB;  Service: Cardiovascular;  Laterality: N/A;  . PERIPHERAL VASCULAR CATHETERIZATION Left 05/23/2015   Procedure: Nolon Stalls;  Surgeon: Conrad [place], MD;  Location: [place] CV LAB;  Service: Cardiovascular;  Laterality: Left;  upper aRM  . PERIPHERAL VASCULAR CATHETERIZATION Left 05/23/2015   Procedure: Peripheral Vascular Balloon Angioplasty;  Surgeon: Conrad , MD;  Location: [place] CV LAB;  Service: Cardiovascular;  Laterality: Left;  av fistula  . TUBAL LIGATION  1993  . UPPER GI ENDOSCOPY N/A 07/19/2012   Procedure: UPPER GI ENDOSCOPY;  Surgeon: Madilyn Hook, DO;  Location: [facility];  Service: General;  Laterality: N/A;     Social History   reports that she quit smoking about 12 years ago. Her smoking use included cigarettes. She has a 16.00 pack-year smoking history. She has never used smokeless tobacco. She reports that she does not drink alcohol or use drugs.   Family History   Her family history includes Arthritis in an other family member; Diabetes in her father; Hyperlipidemia in an other family member; Hypertension in her mother; Mitral valve  prolapse in her mother. There is no history of Cancer, Heart disease, Kidney disease, or Stroke.   Allergies Allergies  Allergen Reactions  . Amlodipine Swelling  . Atorvastatin     Elevated LFT's  . Clonidine Derivatives Swelling    Limbs swell  . Doxycycline Nausea And Vomiting    "I threw up for 3 hours"  . Welchol [Colesevelam Hcl] Nausea Only     Home Medications  Prior to Admission medications   Medication Sig Start Date End Date Taking? Authorizing Provider  aspirin EC 81 MG EC tablet Take 1 tablet (81 mg total) by mouth daily. 02/26/18  Yes Smith, Rondell A, MD  AURYXIA 1 GM 210 MG(Fe) tablet Take 210 mg by mouth 3 (three) times daily with meals.  04/07/17  Yes [provider]  calcium acetate (PHOSLO) 667 MG capsule Take 667 mg by mouth 3 (three) times daily with meals. 06/02/18  Yes [provider]  cinacalcet (SENSIPAR) 90 MG tablet Take 90 mg by mouth at bedtime. 12/08/18  Yes [provider]  Cyanocobalamin (B-12 PO) Take 1 tablet by mouth daily.   Yes [provider]  folic acid (FOLVITE) 1 MG tablet Take 1 tablet (1 mg total) by mouth daily. 03/08/18  Yes [name], MD  gabapentin (NEURONTIN) 100 MG capsule Take 100 mg by mouth 2 (two) times daily. 10/31/18  Yes [provider]  gentamicin cream (GARAMYCIN) 0.1 % Apply 1 application topically See admin instructions. Apply to exit site daily as directed. 04/26/17  Yes [provider]  nitroGLYCERIN (NITROSTAT) 0.4 MG SL tablet PLACE 1 TABLET (0.4 MG TOTAL) UNDER THE TONGUE EVERY 5 (FIVE) MINUTES AS NEEDED FOR CHEST PAIN. 08/01/18  Yes Skeet Latch, MD  rosuvastatin (CRESTOR) 10 MG tablet Take 1 tablet (10 mg total) by mouth daily. 12/12/18 03/12/19 Yes Skeet Latch, MD  ondansetron (ZOFRAN ODT) 8 MG disintegrating tablet Take 1 tablet (8 mg total) by mouth every 8 (eight)  hours as needed for nausea or vomiting. Patient not taking: Reported on 01/15/2019 04/09/18    Jola Schmidt, MD     Critical care time: 79 mins     Georgann Housekeeper, AGACNP-BC [place] for personal pager PCCM on call pager [phone]  01/18/2019 3:23 AM

## 2019-01-18 NOTE — Progress Notes (Signed)
CRITICAL VALUE ALERT  Critical Value:  Potassium 6.9  Date & Time Notied:  01/18/2019 1020  Provider Notified:  Dr Charisse Klinefelter  Orders Received/Actions taken:

## 2019-01-18 NOTE — Progress Notes (Signed)
ABG ordered and obtained while patient resting on nasal cannula.  Placed patient back on bipap and is currently tolerating well.  Will continue to monitor.    Ref. Range 01/18/2019 08:37  Sample type Unknown ARTERIAL  pH, Arterial Latest Ref Range: 7.350 - 7.450  7.288 (L)  pCO2 arterial Latest Ref Range: 32.0 - 48.0 mmHg 43.0  pO2, Arterial Latest Ref Range: 83.0 - 108.0 mmHg 107.0  TCO2 Latest Ref Range: 22 - 32 mmol/L 22  Acid-base deficit Latest Ref Range: 0.0 - 2.0 mmol/L 6.0 (H)  Bicarbonate Latest Ref Range: 20.0 - 28.0 mmol/L 20.6  O2 Saturation Latest Units: % 98.0  Patient temperature Unknown 98.0 F  Collection site Unknown ARTERIAL LINE

## 2019-01-18 NOTE — Progress Notes (Signed)
CRITICAL VALUE ALERT  Critical Value:  Lactic acid 4.6  Date & Time Notied:  01/18/19 @ 6789  Provider Notified: Yes  Orders Received/Actions taken: E-link notified

## 2019-01-18 NOTE — Progress Notes (Signed)
Pharmacy Antibiotic Note  Brenda Davis is a 53 y.o. female admitted on 01/15/2019 with NSTEMI, now with concern for septic shock.  Pharmacy has been consulted for Zosyn dosing.  Plan: Zosyn 2.25g IV Q8H.  Height: 5\' 5"  (165.1 cm) Weight: 292 lb 5.3 oz (132.6 kg) IBW/kg (Calculated) : 57  Temp (24hrs), Avg:98.1 F (36.7 C), Min:97.7 F (36.5 C), Max:98.8 F (37.1 C)  Recent Labs  Lab 01/15/19 1448 01/16/19 0449 01/17/19 0404 01/17/19 2209 01/18/19 0117  WBC 10.8* 10.2 9.8 29.9*  --   CREATININE 15.95* 17.25* 17.21* 17.28* 18.05*    Estimated Creatinine Clearance: 4.9 mL/min (A) (by C-G formula based on SCr of 18.05 mg/dL (H)).    Allergies  Allergen Reactions  . Amlodipine Swelling  . Atorvastatin     Elevated LFT's  . Clonidine Derivatives Swelling    Limbs swell  . Doxycycline Nausea And Vomiting    "I threw up for 3 hours"  . Welchol [Colesevelam Hcl] Nausea Only     Thank you for allowing pharmacy to be a part of this patient's care.  Wynona Neat, PharmD, BCPS  01/18/2019 3:31 AM

## 2019-01-18 NOTE — Progress Notes (Addendum)
Shift event: NP was called by RN because of abnormal ABG results. Also, K 6.8. NP reviewed chart and went to bedside.  Brief HPI: Pt was admitted through ED 2 days ago after being dx with a NSTEMI. Cardiology took pt for a heart cath on 01/17/19. After the heart cath, she began to bleed from the site and cardiology was called stat to bedside. Pt's BP fell to 40s up to the 70s and pressors were started. Cardiology called vascular surgery, and pt was taken emergently to the OR for a retroperitoneal hematoma. After procedure, pt was moved to ICU.   S: + pain in abdomen radiating to her breasts. States she does not know what happened today and why her abdomen is hurting. O: Fairly well appearing female in NAD. Non toxic appearing. Afebrile. HR 80 RR 18 SaO2 100% on 2L [place]. BP 111/74 down to 94 on pressors. Lethargic, but opens eyes to name calling. Follows commands. Speech clear.  Card: RRR with peaked [place] on tele. Resp: normal effort. O2 sat normal.  Extremities warm. Skin is dry.  A/P: 1. Abnormal ABG results-pH is 7.08  PCO2 52  PO2 196 Bicarb 15.9. Her respiratory status looks fine at the moment. No distress. Given 2 amps of bicarb with good response with BP. Satting normally on 2L [place].  2. Hyperkalemia-likely due to PD not being started to her emergency matters today. Given 1 gm Calcium gluconate, amp of D50, and 10U of IV insulin. Lab called K of 6.9, but this was before the tx was given. RN called HD RN to come and start PD. Nephrology saw yesterday. R/p K in 2 hours. Peaked Twaves on tele have improved and are unremarkable on 12 lead EKG.  3. Acute blood loss secondary to retroperitoneal hematoma. Hgb 12.6 which is approximately 2-3 hours after procedure. Recheck that soon.  4. Hypotension secondary to bleeding/acidosis-2 amps bicarb with good response. On pressors. PCCM ordered bicarb gtt.  5. CAD s/p NSTEMI s/p heart cath-see above. Cardiology following.  NP called critical care. They will come  and see patient. They will accept pt on their service because the pt is on 2 pressors.  KJKG, NP [facility] Updated spouse via phone with pt's permission.   Total critical care time: Start 0130  End 0215 for a total of 45 mins.  Critical care time was exclusive of separately billable procedures and treating other patients. Critical care was necessary to treat or prevent imminent or life-threatening deterioration. Critical care was time spent personally by me on the following activities: development of treatment plan with patient and/or surrogate as well as nursing, discussions with consultants, evaluation of patient's response to treatment, examination of patient, obtaining history from patient or surrogate, ordering and performing treatments and interventions, ordering and review of laboratory studies, ordering and review of radiographic studies, pulse oximetry and re-evaluation of patient's condition.

## 2019-01-18 NOTE — Progress Notes (Addendum)
  Progress Note    01/18/2019 9:13 AM 1 Day Post-Op  Subjective:  On bipap.  Follows commands.   afebrile  Vitals:   01/18/19 0803 01/18/19 0845  BP:  (!) 151/74  Pulse:  81  Resp:  16  Temp: 98 F (36.7 C)   SpO2:  97%    Physical Exam: Cardiac:  regular Lungs:  On BiPap with 99% O2 Incisions:  Right groin soft with bandage in place. Extremities:  Right foot with motor and sensory in tact.  Unable to palpate pulse - no doppler is available.   Abdomen:  soft  CBC    Component Value Date/Time   WBC 35.9 (H) 01/18/2019 0420   RBC 3.35 (L) 01/18/2019 0420   HGB 10.5 (L) 01/18/2019 0837   HCT 31.0 (L) 01/18/2019 0837   PLT 302 01/18/2019 0420   MCV 97.3 01/18/2019 0420   MCH 32.5 01/18/2019 0420   MCHC 33.4 01/18/2019 0420   RDW 17.5 (H) 01/18/2019 0420   LYMPHSABS 1.8 05/27/2018 1103   MONOABS 0.6 05/27/2018 1103   EOSABS 0.4 05/27/2018 1103   BASOSABS 0.1 05/27/2018 1103    BMET    Component Value Date/Time   NA 131 (L) 01/18/2019 0837   NA 141 12/26/2018 1022   K 6.2 (H) 01/18/2019 0837   CL 96 (L) 01/18/2019 0420   CO2 15 (L) 01/18/2019 0420   GLUCOSE 330 (H) 01/18/2019 0420   BUN 78 (H) 01/18/2019 0420   BUN 64 (H) 12/26/2018 1022   CREATININE 17.26 (H) 01/18/2019 0420   CREATININE 2.28 (H) 03/21/2012 0952   CALCIUM 7.4 (L) 01/18/2019 0420   GFRNONAA 2 (L) 01/18/2019 0420   GFRAA 2 (L) 01/18/2019 0420    INR    Component Value Date/Time   INR 1.0 05/27/2018 1103     Intake/Output Summary (Last 24 hours) at 01/18/2019 0913 Last data filed at 01/18/2019 0700 Gross per 24 hour  Intake 2608.24 ml  Output 100 ml  Net 2508.24 ml     Assessment:  53 y.o. female is s/p:  Exploration of right groin with primary closure of arteriotomy site and washout of retroperitoneal hematoma  1 Day Post-Op  Plan: -pt groin soft with bandage in place.   -right foot with motor and sensory in tact.  -acute blood loss anemia-stable with hgb 10.5 (10.9  this am on CBC)   Leontine Locket, PA-C [facility] [phone] 01/18/2019 9:13 AM    I have interviewed and examined patient with PA and agree with assessment and plan above.  Plan to take dressing down tomorrow right groin.  Right foot is warm and neurologically intact.  Staples will need to come out in 3 to 4 weeks.  Tocara Mennen C. Donzetta Matters, MD [facility] [place] Office: [phone] Pager: [phone]

## 2019-01-18 NOTE — Progress Notes (Signed)
[facility] Progress Note   Subjective:  Patient went to cath lab for PCI x 2 yesterday, complicated by R groin/ RP bleed, went to OR w/ Dr Donzetta Matters who repaired the artery and washed out the RP hematoma. Did not get PD overnight but was put on this am.  K+ issues 5.5- 6.0 range. Hb is okay. Bipap placed for CO2 retention/ acidosis.   Objective Vitals:   01/18/19 0803 01/18/19 0845 01/18/19 0900 01/18/19 1000  BP:  (!) 151/74  (!) 149/59  Pulse:  81 79 83  Resp:  16 14 16   Temp: 98 F (36.7 C)     TempSrc: Oral     SpO2:  97% 100% 100%  Weight:      Height:        Physical Exam General: Alert, comfortable in bed, nad , on bipap Heart: Regular  Lungs: Clear bilaterally  Abdomen: obese, soft non-tender, PD cath in place , PD effluent is not bloody Extremities: No LE edema  Dialysis Access: LUE AVF +bruit    Dialysis Orders:  CCPD 6 exchanges 2.7L 1.5h dwell No day dwell. (Patient says she has been doing 5  exchanges)   Assessment/Plan: 1. NSTEMI. Known multivessel CAD. [place] 12/28 with progressive severe 3V disease. SP PCI to RCA and ramus intermedius 12/29 2. RP bleed - as complication of PCI procedure, sp surgical repair Dr Donzetta Matters 12/29 3. Hyperkalemia - renal diet, lokelma and continuous PD until resolved. EKG is okay.   4. ESRD - continue CCPD, effluent non-bloody today. Plan PD today and repeat overnight.  5. Hypertension/volume  - BP's low, weaning pressors, CVP low. Bolus NS order written as PRN.  Will use all 1.5% w/ PD tonight.  6. Anemia  - Hgb 11. On ESA as outpatient  7. Metabolic bone disease -  Ca ok. Continue home binders.  Follow labs here.  8. Nutrition - Renal diet/vitamins   Kelly Splinter  MD 01/18/2019, 10:34 AM    Medications: . sodium chloride    . dialysis solution 1.5% low-MG/low-CA    . dialysis solution 2.5% low-MG/low-CA    . norepinephrine (LEVOPHED) Adult infusion 13 mcg/min (01/18/19 1007)  . piperacillin-tazobactam (ZOSYN)  IV  Stopped (01/18/19 0503)  . sodium chloride    . vasopressin (PITRESSIN) infusion - *FOR SHOCK* 0.03 Units/min (01/18/19 0700)   . aspirin EC  81 mg Oral Daily  . calcium acetate  667 mg Oral TID WC  . Chlorhexidine Gluconate Cloth  6 each Topical Daily  . cinacalcet  90 mg Oral Q supper  . ferric citrate  210 mg Oral TID WC  . folic acid  1 mg Oral Daily  . gabapentin  100 mg Oral BID  . gentamicin cream  1 application Topical Daily  . heparin  5,000 Units Subcutaneous Q8H  . insulin aspart  0-5 Units Subcutaneous QHS  . insulin aspart  0-9 Units Subcutaneous TID WC  . mouth rinse  15 mL Mouth Rinse BID  . nitroGLYCERIN  0.5 inch Topical Q6H  . rosuvastatin  10 mg Oral Daily  . sodium chloride flush  10-40 mL Intracatheter Q12H  . sodium chloride flush  3 mL Intravenous Once  . sodium chloride flush  3 mL Intravenous Q12H  . ticagrelor  90 mg Oral BID    Dialysis Orders:  CCPD 6 exchanges 2.7L 1.5h dwell No day dwell. (Patient says she has been doing 5  exchanges)   Assessment/Plan: 9. NSTEMI. Known multivessel CAD. LHC  12/28 with progressive severe 3V disease. TCTS consult for possible CABG.  Has decided to attempt PCI. For intervention in cath lab today.  On heparin gtt, nitro, ASA, BB per cardiology.  10. ESRD -   Continue CCPD for now. Orders written. Follow labs 11. Hypertension/volume  - BP controlled. Low this admission. No volume on exam. Use 1.5%/ 2.5% combination on PD.  12. Anemia  - Hgb 11. On ESA as outpatient  13. Metabolic bone disease -  Ca ok. Continue home binders.  Follow labs here.  14. Nutrition - Renal diet/vitamins   Pt seen, examined and agree w A/P as above.  Kelly Splinter  MD 01/18/2019, 10:34 AM

## 2019-01-19 LAB — POCT I-STAT 7, (LYTES, BLD GAS, ICA,H+H)
Acid-base deficit: 1 mmol/L (ref 0.0–2.0)
Bicarbonate: 25.3 mmol/L (ref 20.0–28.0)
Calcium, Ion: 0.93 mmol/L — ABNORMAL LOW (ref 1.15–1.40)
HCT: 25 % — ABNORMAL LOW (ref 36.0–46.0)
Hemoglobin: 8.5 g/dL — ABNORMAL LOW (ref 12.0–15.0)
O2 Saturation: 95 %
Patient temperature: 97.7
Potassium: 4.4 mmol/L (ref 3.5–5.1)
Sodium: 133 mmol/L — ABNORMAL LOW (ref 135–145)
TCO2: 27 mmol/L (ref 22–32)
pCO2 arterial: 50.4 mmHg — ABNORMAL HIGH (ref 32.0–48.0)
pH, Arterial: 7.307 — ABNORMAL LOW (ref 7.350–7.450)
pO2, Arterial: 84 mmHg (ref 83.0–108.0)

## 2019-01-19 LAB — CBC
HCT: 23.5 % — ABNORMAL LOW (ref 36.0–46.0)
Hemoglobin: 8.1 g/dL — ABNORMAL LOW (ref 12.0–15.0)
MCH: 32.8 pg (ref 26.0–34.0)
MCHC: 34.5 g/dL (ref 30.0–36.0)
MCV: 95.1 fL (ref 80.0–100.0)
Platelets: 218 10*3/uL (ref 150–400)
RBC: 2.47 MIL/uL — ABNORMAL LOW (ref 3.87–5.11)
RDW: 17.2 % — ABNORMAL HIGH (ref 11.5–15.5)
WBC: 28.8 10*3/uL — ABNORMAL HIGH (ref 4.0–10.5)
nRBC: 0.4 % — ABNORMAL HIGH (ref 0.0–0.2)

## 2019-01-19 LAB — HEPATIC FUNCTION PANEL
ALT: 69 U/L — ABNORMAL HIGH (ref 0–44)
AST: 94 U/L — ABNORMAL HIGH (ref 15–41)
Albumin: 2.5 g/dL — ABNORMAL LOW (ref 3.5–5.0)
Alkaline Phosphatase: 56 U/L (ref 38–126)
Bilirubin, Direct: <0.1 mg/dL (ref 0.0–0.2)
Total Bilirubin: 0.5 mg/dL (ref 0.3–1.2)
Total Protein: 5.5 g/dL — ABNORMAL LOW (ref 6.5–8.1)

## 2019-01-19 LAB — RENAL FUNCTION PANEL
Albumin: 2.5 g/dL — ABNORMAL LOW (ref 3.5–5.0)
Anion gap: 19 — ABNORMAL HIGH (ref 5–15)
BUN: 69 mg/dL — ABNORMAL HIGH (ref 6–20)
CO2: 22 mmol/L (ref 22–32)
Calcium: 6.9 mg/dL — ABNORMAL LOW (ref 8.9–10.3)
Chloride: 94 mmol/L — ABNORMAL LOW (ref 98–111)
Creatinine, Ser: 15.08 mg/dL — ABNORMAL HIGH (ref 0.44–1.00)
GFR calc Af Amer: 3 mL/min — ABNORMAL LOW (ref >60)
GFR calc non Af Amer: 2 mL/min — ABNORMAL LOW (ref >60)
Glucose, Bld: 259 mg/dL — ABNORMAL HIGH (ref 70–99)
Phosphorus: 8.9 mg/dL — ABNORMAL HIGH (ref 2.5–4.6)
Potassium: 4.3 mmol/L (ref 3.5–5.1)
Sodium: 135 mmol/L (ref 135–145)

## 2019-01-19 LAB — PREPARE RBC (CROSSMATCH)

## 2019-01-19 LAB — GLUCOSE, CAPILLARY
Glucose-Capillary: 231 mg/dL — ABNORMAL HIGH (ref 70–99)
Glucose-Capillary: 209 mg/dL — ABNORMAL HIGH (ref 70–99)
Glucose-Capillary: 134 mg/dL — ABNORMAL HIGH (ref 70–99)
Glucose-Capillary: 175 mg/dL — ABNORMAL HIGH (ref 70–99)

## 2019-01-19 LAB — PROCALCITONIN: Procalcitonin: 49.32 ng/mL

## 2019-01-19 MED ORDER — DELFLEX-LC/1.5% DEXTROSE 344 MOSM/L IP SOLN: INTRAPERITONEAL | Status: DC

## 2019-01-19 MED ORDER — INSULIN DETEMIR 100 UNIT/ML ~~LOC~~ SOLN
7.0000 [IU] | Freq: Two times a day (BID) | SUBCUTANEOUS | Status: DC
  Administered 2019-01-19 – 2019-01-20 (×3): 7 [IU] via SUBCUTANEOUS
  Filled 2019-01-19 (×4): qty 0.07

## 2019-01-19 MED ORDER — GENTAMICIN SULFATE 0.1 % EX CREA
1.0000 "application " | TOPICAL_CREAM | Freq: Every day | CUTANEOUS | Status: DC
  Administered 2019-01-20: 1 via TOPICAL
  Filled 2019-01-19: qty 15

## 2019-01-19 MED ORDER — SODIUM CHLORIDE 0.9% IV SOLUTION: Freq: Once | INTRAVENOUS | Status: AC

## 2019-01-19 MED ORDER — SODIUM CHLORIDE 0.9% IV SOLUTION: Freq: Once | INTRAVENOUS | Status: DC

## 2019-01-19 MED ORDER — HEPARIN 1000 UNIT/ML FOR PERITONEAL DIALYSIS
500.0000 [IU] | INTRAMUSCULAR | Status: DC | PRN
  Filled 2019-01-19: qty 0.5

## 2019-01-19 MED ORDER — DELFLEX-LC/2.5% DEXTROSE 394 MOSM/L IP SOLN: INTRAPERITONEAL | Status: DC

## 2019-01-19 NOTE — Progress Notes (Signed)
[facility] Progress Note   Subjective:  Got 2 runs of CCPD yest, daytime and nighttime. K+ down today, patient feeling much better. Hb low 8's, cards recommends prbc's x 1-2 units.   Objective Vitals:   01/19/19 1000 01/19/19 1040 01/19/19 1045 01/19/19 1100  BP: 105/63  (!) 104/56 (!) 95/55  Pulse: 75 79 76 77  Resp: 12 13 13 12   Temp:  (!) 97.5 F (36.4 C)  (!) 97.4 F (36.3 C)  TempSrc:  Oral  Oral  SpO2: 99% 95% 95% 97%  Weight:   (!) 137.3 kg   Height:        Physical Exam General: Alert, comfortable in bed, nad, bipap off Heart: Regular  Lungs: Clear bilaterally  Abdomen: obese, soft non-tender, PD cath in place Extremities: No LE edema  Dialysis Access: LUE AVF +bruit    Dialysis Orders:  CCPD 6 exchanges 2.7L 1.5h dwell No day dwell. (Patient says she has been doing 5  exchanges)   Assessment/Plan: 1. NSTEMI. Known multivessel CAD. [place] 12/28 with progressive severe 3V disease. SP PCI to RCA and ramus intermedius 12/29 2. RP bleed - as complication of PCI procedure, sp surgical repair Dr Donzetta Matters 12/29 3. Hyperkalemia - resolved today 4. Anemia abl/ ckd - will give 1u prbc (given recent ^K+) today and can give more tomorrow if needed 5. ESRD - continue CCPD, effluent remains clear. PD tonight.  6. Hypertension/volume  - BP's low, weaning pressors, CVP was low.  All 1.5% fluids 7. Metabolic bone disease -  Ca ok. Continue home binders.  Follow labs here.  8. Nutrition - Renal diet/vitamins   Kelly Splinter  MD 01/18/2019, 10:34 AM    Medications: . sodium chloride 10 mL/hr at 01/19/19 1203  . dialysis solution 1.5% low-MG/low-CA    . norepinephrine (LEVOPHED) Adult infusion Stopped (01/19/19 0920)  . piperacillin-tazobactam (ZOSYN)  IV 2.25 g (01/19/19 1205)  . sodium chloride 250 mL (01/18/19 1836)  . vasopressin (PITRESSIN) infusion - *FOR SHOCK* Stopped (01/18/19 2238)   . sodium chloride   Intravenous Once  . sodium chloride   Intravenous  Once  . aspirin EC  81 mg Oral Daily  . calcium acetate  667 mg Oral TID WC  . Chlorhexidine Gluconate Cloth  6 each Topical Daily  . cinacalcet  90 mg Oral Q supper  . ferric citrate  210 mg Oral TID WC  . folic acid  1 mg Oral Daily  . gabapentin  100 mg Oral BID  . gentamicin cream  1 application Topical Daily  . heparin  5,000 Units Subcutaneous Q8H  . insulin aspart  0-5 Units Subcutaneous QHS  . insulin aspart  0-9 Units Subcutaneous TID WC  . insulin detemir  7 Units Subcutaneous BID  . mouth rinse  15 mL Mouth Rinse BID  . nitroGLYCERIN  0.5 inch Topical Q6H  . rosuvastatin  10 mg Oral Daily  . sodium chloride flush  10-40 mL Intracatheter Q12H  . sodium chloride flush  3 mL Intravenous Once  . sodium chloride flush  3 mL Intravenous Q12H  . ticagrelor  90 mg Oral BID    Dialysis Orders:  CCPD 6 exchanges 2.7L 1.5h dwell No day dwell. (Patient says she has been doing 5  exchanges)   Assessment/Plan: 9. NSTEMI. Known multivessel CAD. [place] 12/28 with progressive severe 3V disease. TCTS consult for possible CABG.  Has decided to attempt PCI. For intervention in cath lab today.  On heparin gtt, nitro,  ASA, BB per cardiology.  10. ESRD -   Continue CCPD for now. Orders written. Follow labs 11. Hypertension/volume  - BP controlled. Low this admission. No volume on exam. Use 1.5%/ 2.5% combination on PD.  12. Anemia  - Hgb 11. On ESA as outpatient  13. Metabolic bone disease -  Ca ok. Continue home binders.  Follow labs here.  14. Nutrition - Renal diet/vitamins   Pt seen, examined and agree w A/P as above.  Kelly Splinter  MD 01/19/2019, 12:06 PM

## 2019-01-19 NOTE — Progress Notes (Addendum)
[facility]  Subjective  - Doing much better.   Objective (!) 95/49 69 97.6 F (36.4 C) (Oral) 12 100%  Intake/Output Summary (Last 24 hours) at 01/19/2019 0836 Last data filed at 01/19/2019 0600 Gross per 24 hour  Intake 489.3 ml  Output --  Net 489.3 ml    GEN NAD A & O X 3 Left groin incision intact, ABD placed over incision and RN instructed to keep groin clean and dry PRN ABD replacement. Left foot warm with intact motor  Assessment/Planning: POD # 2 53 y.o. female is s/p:  Exploration of right groin with primary closure of arteriotomy site and washout of retroperitoneal hematoma  Maintain dry ABD change as needed to left groin Acute blood loss anemia 10.9-8.5 now 8.1 appears asymptomatic sitting up in bed. BP maintaining systolic 83-09 pressors stopped Brilinta started 01/18/19 F/U with Dr. Donzetta Matters or PA for staple removal in 3-4 weeks    Roxy Horseman 01/19/2019 8:36 AM --  Laboratory Lab Results: Recent Labs    01/18/19 0420 01/18/19 2358 01/19/19 0409  WBC 35.9*  --  28.8*  HGB 10.9* 8.5* 8.1*  HCT 32.6* 25.0* 23.5*  PLT 302  --  218   BMET Recent Labs    01/18/19 0847 01/18/19 2358 01/19/19 0409  NA 136 133* 135  K 6.9* 4.4 4.3  CL 96*  --  94*  CO2 18*  --  22  GLUCOSE 380*  --  259*  BUN 77*  --  69*  CREATININE 17.28*  --  15.08*  CALCIUM 7.1*  --  6.9*    COAG Lab Results  Component Value Date   INR 1.0 05/27/2018   INR 1.3 (H) 04/21/2018   INR 1.0 11/26/2017   No results found for: PTT  I have independently interviewed and examined patient and agree with PA assessment and plan above.  Wound is healing well with staples in place.  She will need to follow-up to have staples removed in our office.  No further intervention from vascular surgery at this time.  Please call with questions or changes in clinical course.  Vontrell Pullman C. Donzetta Matters, MD [facility] Office:  [phone] Pager: [phone]

## 2019-01-19 NOTE — Progress Notes (Signed)
NAME:  Brenda Davis, MRN:  [id number], DOB:  [birth year], LOS: 4 ADMISSION DATE:  01/15/2019, CONSULTATION DATE: 12/30 REFERRING MD: Dr. Maylene Roes, CHIEF COMPLAINT: Acidosis  Brief History   53 year old female admitted on 12/27 with NSTEMI.  Cardiac cath complicated by retroperitoneal hemorrhage.  Taken to the OR for repair on 12/29.  Postoperatively with metabolic acidosis and shock requiring vasopressors.  Past Medical History  ESRD on peritoneal dialysis, coronary artery disease, hypertension, hyperlipidemia, and type 2 diabetes  Significant Hospital Events   12/27 admit for NSTEMI 12/28 LHC showed 3 vessel CAD.  12/29 patient opted for stents over CABG, LHC with DES x 2 placed. Complicated by RP bleed. Taken to OR 12/30 Progressive shock, acidosis. BiPAP started for hypercarbic failure.  12/31 Weaning off pressors  Consults:  Cardiology CVTS Nephrology  Procedures:  12/28 LHC (3 vessel CAD) 12/29 LHC DES x2 to RCA and ost 1st mrg.   Significant Diagnostic Tests:  CT abd 12/29 > Large right-sided retroperitoneal hematoma measuring approximately 10.0 x 15.4 x 24.9 cm. Right groin arterial sheath with the tip adjacent to but not clearly within the junction of the right external iliac and common femoral arteries.  Micro Data:  SARS COV2 12/27 > Neg   Antimicrobials:  Zosyn 12/20 >>  Interim history/subjective:  Feels overnight.  Acidosis is improving and she is weaning off pressors Hypokalemia is better  Objective   Blood pressure (!) 95/49, pulse 69, temperature 97.6 F (36.4 C), temperature source Oral, resp. rate 12, height 5' 5"  (1.651 m), weight (!) 138.7 kg, SpO2 100 %.        Intake/Output Summary (Last 24 hours) at 01/19/2019 0823 Last data filed at 01/19/2019 0600 Gross per 24 hour  Intake 489.3 ml  Output --  Net 489.3 ml   Filed Weights   01/17/19 0735 01/18/19 0500 01/18/19 0865  Weight: 132.6 kg (!) 137.3 kg (!) 138.7 kg     Examination: Gen:      No acute distress, obese HEENT:  EOMI, sclera anicteric Neck:     No masses; no thyromegaly Lungs:    Clear to auscultation bilaterally; normal respiratory effort CV:         Regular rate and rhythm; no murmurs Abd:   Mild abdominal tenderness, diminished bowel sounds. Ext:    No edema; adequate peripheral perfusion Skin:      Warm and dry; no rash Neuro: alert and oriented x 3 Psych: normal mood and affect  Resolved Hospital Problem list   Hyperkalemia  Assessment & Plan:   Shock: hemorrhagic vs cardiogenic vs septic. Leukocytosis with no clear source of infection.  Off vasopressin.  Weaning off norepinephrine Continue Zosyn for now.  Low threshold to DC if procalcitonin normal  Acute hypercarbic respiratory failure: suspect she is hypoventilating due to chest wall pain.  BiPAP as needed.  Follow ABG  Anion gap acidosis: likely renal failure +/- lactic acidosis in the setting of shock.  Repeat lactic acid  CAD: Candidate for CABG, she has elected to try PCI first, which was done on 12/29.  - Cardiology, CVTS following  Retroperitoneal hematoma: Complication of LHC 78/46. To OR by Dr Donzetta Matters for repair. s/p 2 units PRBC. .  - Follow CBC.  Transfuse for hemoglobin less than 7  Diabetes mellitus - SSI - Start Levemir   Best practice:  Diet: PO Pain/Anxiety/Delirium protocol (if indicated): NA VAP protocol (if indicated): NA DVT prophylaxis: SQH GI prophylaxis: NA Glucose control: CBG monitoring and SSI,  levemir Mobility: BR Code Status: FULL Family Communication: Patient updated Disposition: ICU  Labs   CBC: Recent Labs  Lab 01/16/19 0449 01/17/19 0404 01/17/19 2209 01/18/19 0420 01/18/19 0425 01/18/19 0837 01/18/19 2358 01/19/19 0409  WBC 10.2 9.8 29.9* 35.9*  --   --   --  28.8*  HGB 12.3 11.0* 12.0 10.9* 11.2* 10.5* 8.5* 8.1*  HCT 37.6 33.1* 36.1 32.6* 33.0* 31.0* 25.0* 23.5*  MCV 103.3* 103.4* 100.3* 97.3  --   --   --  95.1   PLT 335 310 315 302  --   --   --  469    Basic Metabolic Panel: Recent Labs  Lab 01/17/19 0404 01/17/19 1806 01/17/19 2209 01/18/19 0117 01/18/19 0420 01/18/19 0425 01/18/19 0837 01/18/19 0847 01/18/19 1300 01/18/19 1700 01/18/19 2358 01/19/19 0409  NA 137 133*  --  133* 136 133* 131* 136  --   --  133* 135  K 5.3* 4.8  --  6.9* 6.0* 5.9* 6.2* 6.9* 5.9* 5.3* 4.4 4.3  CL 95* 99  --  99 96*  --   --  96*  --   --   --  94*  CO2 22  --   --  12* 15*  --   --  18*  --   --   --  22  GLUCOSE 183* 249*  --  313* 330*  --   --  380*  --   --   --  259*  BUN 75* 73*  --  80* 78*  --   --  77*  --   --   --  69*  CREATININE 17.21* 17.40* 17.28* 18.05* 17.26*  --   --  17.28*  --   --   --  15.08*  CALCIUM 7.6*  --   --  7.3* 7.4*  --   --  7.1*  --   --   --  6.9*  PHOS  --   --   --   --  11.5*  --   --   --   --   --   --  8.9*   GFR: Estimated Creatinine Clearance: 6 mL/min (A) (by C-G formula based on SCr of 15.08 mg/dL (H)). Recent Labs  Lab 01/17/19 0404 01/17/19 2209 01/18/19 0420 01/19/19 0409  WBC 9.8 29.9* 35.9* 28.8*  LATICACIDVEN  --   --  4.6*  --     Liver Function Tests: Recent Labs  Lab 01/15/19 1448 01/18/19 0420 01/19/19 0409  AST 15  --  94*  ALT 17  --  69*  ALKPHOS 98  --  56  BILITOT 0.3  --  0.5  PROT 7.1  --  5.5*  ALBUMIN 3.5 2.5* 2.5*  2.5*   No results for input(s): LIPASE, AMYLASE in the last 168 hours. No results for input(s): AMMONIA in the last 168 hours.  ABG    Component Value Date/Time   PHART 7.307 (L) 01/18/2019 2358   PCO2ART 50.4 (H) 01/18/2019 2358   PO2ART 84.0 01/18/2019 2358   HCO3 25.3 01/18/2019 2358   TCO2 27 01/18/2019 2358   ACIDBASEDEF 1.0 01/18/2019 2358   O2SAT 95.0 01/18/2019 2358     Coagulation Profile: No results for input(s): INR, PROTIME in the last 168 hours.  Cardiac Enzymes: No results for input(s): CKTOTAL, CKMB, CKMBINDEX, TROPONINI in the last 168 hours.  HbA1C: Hemoglobin A1C   Date/Time Value Ref Range Status  07/27/2017 12:00 AM 6.8  Final  05/24/2017 12:00 AM 5.8  Final   Hgb A1c MFr Bld  Date/Time Value Ref Range Status  01/17/2019 04:04 AM 7.2 (H) 4.8 - 5.6 % Final    Comment:    (NOTE) Pre diabetes:          5.7%-6.4% Diabetes:              >6.4% Glycemic control for   <7.0% adults with diabetes   01/16/2019 04:49 AM 8.0 (H) 4.8 - 5.6 % Final    Comment:    (NOTE) Pre diabetes:          5.7%-6.4% Diabetes:              >6.4% Glycemic control for   <7.0% adults with diabetes     CBG: Recent Labs  Lab 01/18/19 0629 01/18/19 1130 01/18/19 1557 01/18/19 2216 01/19/19 0659  GLUCAP 271* 327* 349* 301* 231*    Critical care time:    The patient is critically ill with multiple organ system failure and requires high complexity decision making for assessment and support, frequent evaluation and titration of therapies, advanced monitoring, review of radiographic studies and interpretation of complex data.   Critical Care Time devoted to patient care services, exclusive of separately billable procedures, described in this note is 45  minutes.   Marshell Garfinkel MD [facility] Please see [email or web address] for pager details.  01/19/2019, 8:23 AM

## 2019-01-19 NOTE — Progress Notes (Signed)
Inpatient Diabetes Program Recommendations  AACE/ADA: New Consensus Statement on Inpatient Glycemic Control (2015)  Target Ranges:  Prepandial:   less than 140 mg/dL      Peak postprandial:   less than 180 mg/dL (1-2 hours)      Critically ill patients:  140 - 180 mg/dL   Results for Brenda Davis, Brenda Davis (MRN [id number]) as of 01/19/2019 07:53  Ref. Range 01/18/2019 06:29 01/18/2019 11:30 01/18/2019 15:57 01/18/2019 22:16  Glucose-Capillary Latest Ref Range: 70 - 99 mg/dL 271 (H)  5 units NOVOLOG  327 (H)  9 units NOVOLOG  349 (H)  7 units NOVOLOG  301 (H)  4 units NOVOLOG    Results for Brenda Davis, Brenda Davis (MRN [id number]) as of 01/19/2019 07:53  Ref. Range 01/19/2019 06:59  Glucose-Capillary Latest Ref Range: 70 - 99 mg/dL 231 (H)  3 units NOVOLOG     Home DM Meds: None listed  Current Orders: Novolog Sensitive Correction Scale/ SSI (0-9 units) TID AC + HS     MD- Note CBGs 200-300s range  Please consider adding basal insulin to in-hospital insulin regimen:  Levemir 7 units BID (0.1 units/kg)    --Will follow patient during hospitalization--  Wyn Quaker RN, MSN, CDE Diabetes Coordinator Inpatient Glycemic Control Team Team Pager: [phone] (8a-5p)

## 2019-01-19 NOTE — Progress Notes (Signed)
Progress Note  Patient Name: KRYSTIANNA SOTH Date of Encounter: 01/19/2019  Primary Cardiologist: Skeet Latch, MD   Subjective   Events from post cath 01/17/2019 -->underwent PCI to RCA and ramus intermedius. Post cath became hypotensive (but not tachycardic).  CT abdomen showed large retroperitoneal hemorrhage, vascular surgery consulted and Dr. Donzetta Matters took the patient to the OR for surgical repair and drainage of the hematoma.--It appeared that the sheath was going in and out of the arteriotomy with bleeding around the arteriotomy site.  The sheath actually been dislodged from the arteriotomy during the initial stages of surgery.  Both the current and old arteriotomy sites were repaired in the retroperitoneal hemorrhage was drained. ->  Postop was complicated by metabolic acidosis with hyperkalemia and acute blood loss anemia with circulatory shock.  Now off vasopressors.  Doing intermittent peritoneal dialysis.  No longer on BiPAP.  This morning, she is feeling much better.  Thankful to be alive and doing relatively well.  She does have some abdominal tenderness in the postop site.  No chest pain or pressure.  Still feels little bit weak, but has a positive outlook. No signs or symptoms of irregular heartbeats or palpitations. No longer lethargic.   Inpatient Medications    Scheduled Meds: . sodium chloride   Intravenous Once  . aspirin EC  81 mg Oral Daily  . calcium acetate  667 mg Oral TID WC  . Chlorhexidine Gluconate Cloth  6 each Topical Daily  . cinacalcet  90 mg Oral Q supper  . ferric citrate  210 mg Oral TID WC  . folic acid  1 mg Oral Daily  . gabapentin  100 mg Oral BID  . gentamicin cream  1 application Topical Daily  . heparin  5,000 Units Subcutaneous Q8H  . insulin aspart  0-5 Units Subcutaneous QHS  . insulin aspart  0-9 Units Subcutaneous TID WC  . insulin detemir  7 Units Subcutaneous BID  . mouth rinse  15 mL Mouth Rinse BID  . nitroGLYCERIN   0.5 inch Topical Q6H  . rosuvastatin  10 mg Oral Daily  . sodium chloride flush  10-40 mL Intracatheter Q12H  . sodium chloride flush  3 mL Intravenous Once  . sodium chloride flush  3 mL Intravenous Q12H  . ticagrelor  90 mg Oral BID   Continuous Infusions: . sodium chloride Stopped (01/19/19 0514)  . dialysis solution 1.5% low-MG/low-CA    . norepinephrine (LEVOPHED) Adult infusion Stopped (01/19/19 0920)  . piperacillin-tazobactam (ZOSYN)  IV Stopped (01/19/19 0433)  . sodium chloride 250 mL (01/18/19 1836)  . vasopressin (PITRESSIN) infusion - *FOR SHOCK* Stopped (01/18/19 2238)   PRN Meds: sodium chloride, acetaminophen, acetaminophen, fentaNYL (SUBLIMAZE) injection, gentamicin cream, dianeal solution for CAPD/CCPD with heparin, nitroGLYCERIN, ondansetron (ZOFRAN) IV, oxyCODONE-acetaminophen, sodium chloride, sodium chloride flush, sodium chloride flush   Vital Signs    Vitals:   01/19/19 0800 01/19/19 0900 01/19/19 1000 01/19/19 1040  BP: (!) 95/49 131/75 105/63   Pulse: 69 72 75 79  Resp: 12 19 12 13   Temp: 97.6 F (36.4 C)   (!) 97.5 F (36.4 C)  TempSrc: Oral   Oral  SpO2: 100% 100% 99% 95%  Weight:      Height:        Intake/Output Summary (Last 24 hours) at 01/19/2019 1043 Last data filed at 01/19/2019 1000 Gross per 24 hour  Intake 614.01 ml  Output --  Net 614.01 ml   Last 3 Weights 01/18/2019 01/18/2019 01/17/2019  Weight (lbs) 305 lb 12.5 oz 302 lb 11.1 oz 292 lb 5.3 oz  Weight (kg) 138.7 kg 137.3 kg 132.6 kg      Telemetry    Remains in sinus rhythm with occasional PVCs..- Personally Reviewed  ECG    No new EKG.- Personally Reviewed  Physical Exam   General appearance: alert, cooperative, no distress, morbidly obese and Seems a little bit lethargic, but otherwise stable.  Much more alert than yesterday. Neck:  Very difficult to assess for any JVD.  Due to body habitus. Lungs: Distant breath sounds, but CTA B.  Mildly diminished in bases likely  related to body habitus and poor respiratory effort.  No obvious W/R/R Heart: Distant S1 and S2 relatively normal.  RRR.  No obvious M/R/G. Abdomen: Morbidly obese.  Soft mild lower right side tenderness.  Has JP drain in place as well as dressing in the inguinal area. Extremities: No obvious edema.  Mild ulcer scars on her feet and back. Pulses: Bilateral feet feel warm with palpable pulses. Neurologic: Grossly normal ;    Labs    High Sensitivity Troponin:   Recent Labs  Lab 01/15/19 1448 01/15/19 1806  TROPONINIHS 43* 781*      Chemistry Recent Labs  Lab 01/15/19 1448 01/18/19 0420 01/18/19 0847 01/18/19 1700 01/18/19 2358 01/19/19 0409  NA 136 136 136  --  133* 135  K 4.4 6.0* 6.9* 5.3* 4.4 4.3  CL 95* 96* 96*  --   --  94*  CO2 19* 15* 18*  --   --  22  GLUCOSE 246* 330* 380*  --   --  259*  BUN 69* 78* 77*  --   --  69*  CREATININE 15.95* 17.26* 17.28*  --   --  15.08*  CALCIUM 8.2* 7.4* 7.1*  --   --  6.9*  PROT 7.1  --   --   --   --  5.5*  ALBUMIN 3.5 2.5*  --   --   --  2.5*  2.5*  AST 15  --   --   --   --  94*  ALT 17  --   --   --   --  69*  ALKPHOS 98  --   --   --   --  56  BILITOT 0.3  --   --   --   --  0.5  GFRNONAA 2* 2* 2*  --   --  2*  GFRAA 3* 2* 2*  --   --  3*  ANIONGAP 22* 25* 22*  --   --  19*     Hematology Recent Labs  Lab 01/17/19 2209 01/18/19 0420 01/18/19 0837 01/18/19 2358 01/19/19 0409  WBC 29.9* 35.9*  --   --  28.8*  RBC 3.60* 3.35*  --   --  2.47*  HGB 12.0 10.9* 10.5* 8.5* 8.1*  HCT 36.1 32.6* 31.0* 25.0* 23.5*  MCV 100.3* 97.3  --   --  95.1  MCH 33.3 32.5  --   --  32.8  MCHC 33.2 33.4  --   --  34.5  RDW 16.8* 17.5*  --   --  17.2*  PLT 315 302  --   --  218    BNPNo results for input(s): BNP, PROBNP in the last 168 hours.   DDimer No results for input(s): DDIMER in the last 168 hours.   Radiology    CT ABDOMEN PELVIS WO CONTRAST  Result Date:  01/17/2019 CLINICAL DATA:  Abdominal and groin pain status  post cardiac catheterization yesterday. EXAM: CT ABDOMEN AND PELVIS WITHOUT CONTRAST TECHNIQUE: Multidetector CT imaging of the abdomen and pelvis was performed following the standard protocol without IV contrast. COMPARISON:  CT abdomen pelvis dated April 18, 2018. FINDINGS: Lower chest: No acute abnormality. Subsegmental atelectasis in both lower lobes and lingula. 4 mm ground-glass nodule in the right middle lobe. Hepatobiliary: Subtle nodular contour of the left hepatic lobe again noted. No focal liver abnormality. Status post cholecystectomy. No biliary dilatation. Pancreas: Unremarkable. No pancreatic ductal dilatation or surrounding inflammatory changes. Spleen: Normal in size without focal abnormality. Adrenals/Urinary Tract: The adrenal glands are unremarkable. Unchanged bilateral renal atrophy with trace residual contrast in both collecting systems. No hydronephrosis. The bladder is decompressed and contains excreted contrast. Stomach/Bowel: Unchanged small hiatal hernia. Postsurgical changes related to prior gastric sleeve resection again noted. No bowel wall thickening, distention, or surrounding inflammatory changes. Mild left-sided colonic diverticulosis. Normal appendix. Vascular/Lymphatic: Aortoiliac and branch vessel atherosclerosis. No enlarged abdominal or pelvic lymph nodes. Reproductive: Uterus and bilateral adnexa are unremarkable. Other: Right groin arterial sheath with the tip adjacent to but not clearly within the junction of the right external iliac and common femoral arteries. Large right-sided retroperitoneal hematoma measuring approximately 10.0 x 15.4 x 24.9 cm (AP by transverse by CC). Tiny foci of pneumoperitoneum overlying the liver likely related to peritoneal dialysis catheter. Musculoskeletal: No acute or significant osseous findings. IMPRESSION: 1. Large right-sided retroperitoneal hematoma measuring approximately 10.0 x 15.4 x 24.9 cm. 2. Right groin arterial sheath with the tip  adjacent to but not clearly within the junction of the right external iliac and common femoral arteries. 3. Subtle nodular contour of the left hepatic lobe again noted, suspicious for cirrhosis. 4. Tiny foci of pneumoperitoneum again noted, likely related to peritoneal dialysis. 5.  Aortic atherosclerosis (ICD10-I70.0). Electronically Signed   By: Titus Dubin M.D.   On: 01/17/2019 18:10   CARDIAC CATHETERIZATION  Addendum Date: 01/17/2019    Mid LAD lesion is 75% stenosed. The LAD was not addressed and will be managed medically.  Ost LAD to Mid LAD lesion is 40% stenosed.  Mid Cx to Dist Cx lesion is 55% stenosed with 40% stenosed side branch in Ost 3rd Mrg.  Prox RCA lesion is 75% stenosed.  A drug-eluting stent was successfully placed using a SYNERGY XD 3.0X38, postdilated to 3.5 mm.  Post intervention, there is a 0% residual stenosis.  Ost 1st Mrg lesion is 99% stenosed. EBU4 Guide catheter used.  A drug-eluting stent was successfully placed using a SYNERGY XD 2.25X24, postdilated to 2.75 mm.  Post intervention, there is a 0% residual stenosis.  Continue DAPT for 12 months.  Would recommend clopidogrel long-term after one year since she has such diffuse disease.  Continue aggressive secondary prevention.   Result Date: 01/17/2019  Mid LAD lesion is 75% stenosed. The LAD was not addressed and will be managed medically.  Ost LAD to Mid LAD lesion is 40% stenosed.  Mid Cx to Dist Cx lesion is 55% stenosed with 40% stenosed side branch in Ost 3rd Mrg.  Prox RCA lesion is 75% stenosed.  A drug-eluting stent was successfully placed using a SYNERGY XD 3.0X38, postdilated to 3.5 mm.  Post intervention, there is a 0% residual stenosis.  Ost 1st Mrg lesion is 99% stenosed. EBU4 Guide catheter used.  A drug-eluting stent was successfully placed using a SYNERGY XD 2.25X24, postdilated to 2.75 mm.  Post intervention, there  is a 0% residual stenosis.  Continue DAPT for 12 months.  Would recommend  clopidogrel long-term after one year since she has such diffuse disease.  Continue aggressive secondary prevention.   DG CHEST PORT 1 VIEW  Result Date: 01/17/2019 CLINICAL DATA:  Central line placement. EXAM: PORTABLE CHEST 1 VIEW COMPARISON:  Chest x-ray dated January 15, 2019. FINDINGS: New right internal jugular central venous catheter with tip at the cavoatrial junction. The heart size and mediastinal contours are within normal limits. Normal pulmonary vascularity. Minimal bibasilar atelectasis. No focal consolidation, pleural effusion, or pneumothorax. No acute osseous abnormality. IMPRESSION: New right internal jugular central venous catheter without complicating feature. Electronically Signed   By: Titus Dubin M.D.   On: 01/17/2019 21:32    Cardiac Studies   Diagnostic      Intervention            Patient Profile     53 y.o. female with a history of known moderate CAD in the past by catheterization (last in June 2020) with chronic chest pain initially seen for evaluation of chest pain and evaluate troponin/non-STEMI at the request of [facility]. On cardiac catheterization was found to have severe ramus intermedius disease and progression of disease to moderate-severe RCA disease along with extensive disease in the LAD.  Was offered CABG, however the patient chose to undergo PCI of RCA and RI/OM1 with medical management of the LAD due to extensive calcification.  Underwent staged PCI of the RCA and RI/OM1 on 01/17/2019.  The case went relatively smoothly, however postop became hypotensive and found to have acute retroperitoneal hematoma/hemorrhage and taken to the OR.  Decompensated overnight with continuous hypotension and worsening combined metabolic and respiratory acidosis along with acute hypercapnic/hypoxic respiratory failure.  Placed on BiPAP, started on peritoneal dialysis, blood pressure maintained with Levophed and vasopressin.-->  Currently weaned off of pressors and BiPAP.   Continues routine peritoneal dialysis.  Assessment & Plan    Principal Problem:   NSTEMI (non-ST elevated myocardial infarction) ([place]) Active Problems:   Retroperitoneal hemorrhage - post Cath/PCI - taken to OR for drainage & CFA repair   Acute blood loss anemia - resolved, transfused in OR - Hgb stable   Increased anion gap metabolic acidosis   Shock circulatory (HCC) - secondary to acute blood loss anemia, elevated WBC (? sepsis vs post-op inflammation).h   Acute respiratory failure with hypoxia and hypercapnia (HCC)   Dyslipidemia, goal LDL below 70   Morbid obesity with BMI of 40.0-44.9, adult (HCC)   S/P laparoscopic sleeve gastrectomy   ESRD on peritoneal dialysis (HCC)   DM (diabetes mellitus), type 2 with renal complications (HCC)   Hepatic cirrhosis (HCC)   Coronary artery disease involving native coronary artery of native heart with unstable angina pectoris (HCC)   Postprocedural hypotension   Hyperkalemia  Principal Problem:   NSTEMI (non-ST elevated myocardial infarction) ([place])--  Coronary artery disease involving native coronary artery of native heart with unstable angina pectoris (HCC)--two-vessel PCI  Status post DES PCI of the circumflex-OM and RCA.  Obviously beta-blocker on hold because of shock. -- >  Currently off pressors, would likely consider restarting beta-blocker tomorrow  Remains on aspirin and Brilinta,--still no signs of active bleeding, however she does have a drop in hemoglobin today.  On renal dose statin (rosuvastatin)-we will consider outpatient CVRR consult for PCSK9 inhibitor.Marland Kitchen  Active Problems:   Retroperitoneal hemorrhage - post Cath/PCI - taken to OR for drainage & CFA repair /Acute blood loss anemia -  ,  transfused in OR - Hgb stable  Appreciate prompt attention by vascular surgical team. ->  Plans to take down dressing tomorrow.  No signs of active bleeding.  Hemoglobin has been staying stable, but now dropped to 8.1.  Remains  hemodynamically stable.  Have discussed with both critical care and nephrology.  We will plan to transfuse 1 to 2 units.      Shock circulatory ([place]) - secondary to acute blood loss anemia, elevated WBC (? sepsis vs post-op inflammation).  Currently off pressors.  White blood cell count remains elevated, but down.  Is on empiric antibiotics.  Holding off on beta-blocker for now, anticipate starting tomorrow.    Acute respiratory failure with hypoxia and hypercapnia (HCC) -management PCCM.    Off BiPAP now.  Question benefit from long-term nightly CPAP or BiPAP    Dyslipidemia, goal LDL below 70 -  converted to (renal dose) rosuvastatin in the outpatient, anticipate need for PCSK9 inhibitor    ESRD on peritoneal dialysis (HCC)/  Increased anion gap metabolic acidosis -improving with peritoneal dialysis.     Hyperkalemia -resolved  Seen by Dr. Jonnie Finner, doing nightly PD    DM (diabetes mellitus), type 2 with renal complications ([place]) -per medicine service   Hepatic cirrhosis ([place]) - not addressed (defer to [facility])    Morbid obesity with BMI of 40.0-44.9, adult ([place]) -> likely contributing somewhat to respiratory failure (cannot exclude component of obesity hypoventilation)  Mention to her that when she is seen in outpatient follow-up that she should be evaluated with Epworth score to determine appropriateness of sleep study.      For questions or updates, please contact [place] Please consult [email or web address] for contact info under        Signed, Glenetta Hew, MD  01/19/2019, 10:43 AM

## 2019-01-20 ENCOUNTER — Inpatient Hospital Stay (HOSPITAL_COMMUNITY): Payer: Medicare Other

## 2019-01-20 DIAGNOSIS — D62 Acute posthemorrhagic anemia: Secondary | ICD-10-CM

## 2019-01-20 DIAGNOSIS — Z992 Dependence on renal dialysis: Secondary | ICD-10-CM

## 2019-01-20 DIAGNOSIS — E1122 Type 2 diabetes mellitus with diabetic chronic kidney disease: Secondary | ICD-10-CM

## 2019-01-20 DIAGNOSIS — Z794 Long term (current) use of insulin: Secondary | ICD-10-CM

## 2019-01-20 DIAGNOSIS — R58 Hemorrhage, not elsewhere classified: Secondary | ICD-10-CM

## 2019-01-20 DIAGNOSIS — N186 End stage renal disease: Secondary | ICD-10-CM

## 2019-01-20 DIAGNOSIS — J9601 Acute respiratory failure with hypoxia: Secondary | ICD-10-CM

## 2019-01-20 DIAGNOSIS — I25110 Atherosclerotic heart disease of native coronary artery with unstable angina pectoris: Secondary | ICD-10-CM

## 2019-01-20 DIAGNOSIS — I214 Non-ST elevation (NSTEMI) myocardial infarction: Secondary | ICD-10-CM

## 2019-01-20 DIAGNOSIS — J9602 Acute respiratory failure with hypercapnia: Secondary | ICD-10-CM

## 2019-01-20 DIAGNOSIS — R579 Shock, unspecified: Secondary | ICD-10-CM

## 2019-01-20 LAB — CBC
HCT: 22.7 % — ABNORMAL LOW (ref 36.0–46.0)
Hemoglobin: 7.7 g/dL — ABNORMAL LOW (ref 12.0–15.0)
MCH: 32.1 pg (ref 26.0–34.0)
MCHC: 33.9 g/dL (ref 30.0–36.0)
MCV: 94.6 fL (ref 80.0–100.0)
Platelets: 198 10*3/uL (ref 150–400)
RBC: 2.4 MIL/uL — ABNORMAL LOW (ref 3.87–5.11)
RDW: 18.1 % — ABNORMAL HIGH (ref 11.5–15.5)
WBC: 24.7 10*3/uL — ABNORMAL HIGH (ref 4.0–10.5)
nRBC: 0.4 % — ABNORMAL HIGH (ref 0.0–0.2)

## 2019-01-20 LAB — PROCALCITONIN: Procalcitonin: 48.02 ng/mL

## 2019-01-20 LAB — RENAL FUNCTION PANEL
Albumin: 2.5 g/dL — ABNORMAL LOW (ref 3.5–5.0)
Anion gap: 18 — ABNORMAL HIGH (ref 5–15)
BUN: 67 mg/dL — ABNORMAL HIGH (ref 6–20)
CO2: 25 mmol/L (ref 22–32)
Calcium: 6.6 mg/dL — ABNORMAL LOW (ref 8.9–10.3)
Chloride: 91 mmol/L — ABNORMAL LOW (ref 98–111)
Creatinine, Ser: 14.19 mg/dL — ABNORMAL HIGH (ref 0.44–1.00)
GFR calc Af Amer: 3 mL/min — ABNORMAL LOW (ref >60)
GFR calc non Af Amer: 3 mL/min — ABNORMAL LOW (ref >60)
Glucose, Bld: 218 mg/dL — ABNORMAL HIGH (ref 70–99)
Phosphorus: 8.1 mg/dL — ABNORMAL HIGH (ref 2.5–4.6)
Potassium: 3.6 mmol/L (ref 3.5–5.1)
Sodium: 134 mmol/L — ABNORMAL LOW (ref 135–145)

## 2019-01-20 LAB — GLUCOSE, CAPILLARY
Glucose-Capillary: 201 mg/dL — ABNORMAL HIGH (ref 70–99)
Glucose-Capillary: 139 mg/dL — ABNORMAL HIGH (ref 70–99)
Glucose-Capillary: 97 mg/dL (ref 70–99)
Glucose-Capillary: 124 mg/dL — ABNORMAL HIGH (ref 70–99)

## 2019-01-20 LAB — MAGNESIUM: Magnesium: 1.7 mg/dL (ref 1.7–2.4)

## 2019-01-20 LAB — PREPARE RBC (CROSSMATCH)

## 2019-01-20 MED ORDER — DELFLEX-LC/2.5% DEXTROSE 394 MOSM/L IP SOLN
INTRAPERITONEAL | Status: DC
  Administered 2019-01-21 – 2019-01-22 (×2): 5000 mL via INTRAPERITONEAL

## 2019-01-20 MED ORDER — HEPARIN 1000 UNIT/ML FOR PERITONEAL DIALYSIS
500.0000 [IU] | INTRAMUSCULAR | Status: DC | PRN
  Filled 2019-01-20 (×2): qty 0.5

## 2019-01-20 MED ORDER — ONDANSETRON 4 MG PO TBDP
4.0000 mg | ORAL_TABLET | Freq: Four times a day (QID) | ORAL | Status: DC | PRN
  Administered 2019-01-21 – 2019-01-22 (×2): 4 mg via ORAL
  Filled 2019-01-20 (×3): qty 1

## 2019-01-20 MED ORDER — DELFLEX-LC/1.5% DEXTROSE 344 MOSM/L IP SOLN
INTRAPERITONEAL | Status: DC
  Administered 2019-01-21 – 2019-01-22 (×2): 5000 mL via INTRAPERITONEAL

## 2019-01-20 MED ORDER — POTASSIUM CHLORIDE CRYS ER 20 MEQ PO TBCR
40.0000 meq | EXTENDED_RELEASE_TABLET | Freq: Once | ORAL | Status: AC
  Administered 2019-01-20: 14:00:00 40 meq via ORAL
  Filled 2019-01-20: qty 2

## 2019-01-20 MED ORDER — DARBEPOETIN ALFA 60 MCG/0.3ML IJ SOSY
60.0000 ug | PREFILLED_SYRINGE | INTRAMUSCULAR | Status: DC
  Administered 2019-01-20: 23:00:00 60 ug via SUBCUTANEOUS
  Filled 2019-01-20 (×2): qty 0.3

## 2019-01-20 MED ORDER — GENTAMICIN SULFATE 0.1 % EX OINT
TOPICAL_OINTMENT | Freq: Every day | CUTANEOUS | Status: DC
  Filled 2019-01-20: qty 15

## 2019-01-20 MED ORDER — INSULIN DETEMIR 100 UNIT/ML ~~LOC~~ SOLN
10.0000 [IU] | Freq: Two times a day (BID) | SUBCUTANEOUS | Status: DC
  Administered 2019-01-21 – 2019-01-22 (×2): 10 [IU] via SUBCUTANEOUS
  Filled 2019-01-20 (×7): qty 0.1

## 2019-01-20 MED ORDER — GENTAMICIN SULFATE 0.1 % EX CREA
1.0000 "application " | TOPICAL_CREAM | Freq: Every day | CUTANEOUS | Status: DC
  Filled 2019-01-20: qty 15

## 2019-01-20 MED ORDER — SODIUM CHLORIDE 0.9% IV SOLUTION: Freq: Once | INTRAVENOUS | Status: AC

## 2019-01-20 NOTE — Progress Notes (Signed)
PIV consult: arrived to room, RN reports she no longer needs second site. Cancel consult.

## 2019-01-20 NOTE — Care Management (Signed)
Brilinta benefits check sent and pending.  Midge Minium MSN, RN, NCM-BC, ACM-RN [phone]

## 2019-01-20 NOTE — Progress Notes (Signed)
NAME:  Brenda Davis, MRN:  [id number], DOB:  [birth year], LOS: 5 ADMISSION DATE:  01/15/2019, CONSULTATION DATE: 12/30 REFERRING MD: Dr. Maylene Roes, CHIEF COMPLAINT: Acidosis  Brief History   54 year old female admitted on 12/27 with NSTEMI.  Cardiac cath complicated by retroperitoneal hemorrhage.  Taken to the OR for repair on 12/29.  Postoperatively with metabolic acidosis and shock requiring vasopressors.  Past Medical History  ESRD on peritoneal dialysis, coronary artery disease, hypertension, hyperlipidemia, and type 2 diabetes  Significant Hospital Events   12/27 admit for NSTEMI 12/28 LHC showed 3 vessel CAD.  12/29 patient opted for stents over CABG, LHC with DES x 2 placed. Complicated by RP bleed. Taken to OR 12/30 Progressive shock, acidosis. BiPAP started for hypercarbic failure.  1/1 off Pressors  Consults:  Cardiology CVTS Nephrology  Procedures:  12/28 LHC (3 vessel CAD) 12/29 LHC DES x2 to RCA and ost 1st mrg.   Significant Diagnostic Tests:  CT abd 12/29 > Large right-sided retroperitoneal hematoma measuring approximately 10.0 x 15.4 x 24.9 cm. Right groin arterial sheath with the tip adjacent to but not clearly within the junction of the right external iliac and common femoral arteries.  Micro Data:  SARS COV2 12/27 > Neg   Antimicrobials:  Zosyn 12/20 >>  Interim history/subjective:  Continues to feel better.  Has mild lower abdominal pain and tenderness  Objective   Blood pressure 104/67, pulse 80, temperature 97.8 F (36.6 C), temperature source Oral, resp. rate 11, height 5' 5"  (1.651 m), weight (!) 139.4 kg, SpO2 100 %. CVP:  [12 mmHg] 12 mmHg      Intake/Output Summary (Last 24 hours) at 01/20/2019 0908 Last data filed at 01/20/2019 0500 Gross per 24 hour  Intake 497.42 ml  Output 720 ml  Net -222.58 ml   Filed Weights   01/18/19 0702 01/19/19 1045 01/20/19 0600  Weight: (!) 138.7 kg (!) 137.3 kg (!) 139.4 kg    Examination: Gen:        No acute distress HEENT:  EOMI, sclera anicteric Neck:     No masses; no thyromegaly Lungs:    Clear to auscultation bilaterally; normal respiratory effort CV:         Regular rate and rhythm; no murmurs Abd:      Mild abdominal tenderness, diminished bowel sounds. Ext:    No edema; adequate peripheral perfusion Skin:      Warm and dry; no rash Neuro: alert and oriented x 3 Psych: normal mood and affect  Resolved Hospital Problem list   Hyperkalemia  Assessment & Plan:   Shock: hemorrhagic vs cardiogenic vs septic. Leukocytosis with no clear source of infection.  Off pressors. DC CVL and a line Continue Zosyn.  PCT is high  Acute hypercarbic respiratory failure> improved  BiPAP as needed.  Respiratory status is remained stable over 48 hours.  Anion gap acidosis: likely renal failure +/- lactic acidosis in the setting of shock.  Repeat lactic acid  CAD: Candidate for CABG, she has elected to try PCI first, which was done on 12/29.  - Cardiology, CVTS following  Retroperitoneal hematoma: Complication of LHC 03/88. To OR by Dr Donzetta Matters for repair. s/p 2 units PRBC. .  - 1 unit PRBC transfusion  Diabetes mellitus - SSI - Increase Levemir dose   Best practice:  Diet: PO Pain/Anxiety/Delirium protocol (if indicated): NA VAP protocol (if indicated): NA DVT prophylaxis: SQH GI prophylaxis: NA Glucose control: CBG monitoring and SSI, levemir Mobility: BR Code Status: FULL Family  Communication: Patient updated Disposition: Stable for transfer out of ICU and back to [facility] service.  Labs   CBC: Recent Labs  Lab 01/17/19 0404 01/17/19 2209 01/18/19 0420 01/18/19 0425 01/18/19 5732 01/18/19 2358 01/19/19 0409 01/20/19 0221  WBC 9.8 29.9* 35.9*  --   --   --  28.8* 24.7*  HGB 11.0* 12.0 10.9* 11.2* 10.5* 8.5* 8.1* 7.7*  HCT 33.1* 36.1 32.6* 33.0* 31.0* 25.0* 23.5* 22.7*  MCV 103.4* 100.3* 97.3  --   --   --  95.1 94.6  PLT 310 315 302  --   --   --  218 198    Basic  Metabolic Panel: Recent Labs  Lab 01/18/19 0117 01/18/19 0420 01/18/19 0837 01/18/19 0847 01/18/19 1300 01/18/19 1700 01/18/19 2358 01/19/19 0409 01/20/19 0221  NA 133* 136 131* 136  --   --  133* 135 134*  K 6.9* 6.0* 6.2* 6.9* 5.9* 5.3* 4.4 4.3 3.6  CL 99 96*  --  96*  --   --   --  94* 91*  CO2 12* 15*  --  18*  --   --   --  22 25  GLUCOSE 313* 330*  --  380*  --   --   --  259* 218*  BUN 80* 78*  --  77*  --   --   --  69* 67*  CREATININE 18.05* 17.26*  --  17.28*  --   --   --  15.08* 14.19*  CALCIUM 7.3* 7.4*  --  7.1*  --   --   --  6.9* 6.6*  MG  --   --   --   --   --   --   --   --  1.7  PHOS  --  11.5*  --   --   --   --   --  8.9* 8.1*   GFR: Estimated Creatinine Clearance: 6.4 mL/min (A) (by C-G formula based on SCr of 14.19 mg/dL (H)). Recent Labs  Lab 01/17/19 2209 01/18/19 0420 01/19/19 0409 01/19/19 0943 01/20/19 0221  PROCALCITON  --   --   --  49.32 48.02  WBC 29.9* 35.9* 28.8*  --  24.7*  LATICACIDVEN  --  4.6*  --   --   --     Liver Function Tests: Recent Labs  Lab 01/15/19 1448 01/18/19 0420 01/19/19 0409 01/20/19 0221  AST 15  --  94*  --   ALT 17  --  69*  --   ALKPHOS 98  --  56  --   BILITOT 0.3  --  0.5  --   PROT 7.1  --  5.5*  --   ALBUMIN 3.5 2.5* 2.5*  2.5* 2.5*   No results for input(s): LIPASE, AMYLASE in the last 168 hours. No results for input(s): AMMONIA in the last 168 hours.  ABG    Component Value Date/Time   PHART 7.307 (L) 01/18/2019 2358   PCO2ART 50.4 (H) 01/18/2019 2358   PO2ART 84.0 01/18/2019 2358   HCO3 25.3 01/18/2019 2358   TCO2 27 01/18/2019 2358   ACIDBASEDEF 1.0 01/18/2019 2358   O2SAT 95.0 01/18/2019 2358     Coagulation Profile: No results for input(s): INR, PROTIME in the last 168 hours.  Cardiac Enzymes: No results for input(s): CKTOTAL, CKMB, CKMBINDEX, TROPONINI in the last 168 hours.  HbA1C: Hemoglobin A1C  Date/Time Value Ref Range Status  07/27/2017 12:00 AM 6.8  Final  05/24/2017 12:00 AM 5.8  Final   Hgb A1c MFr Bld  Date/Time Value Ref Range Status  01/17/2019 04:04 AM 7.2 (H) 4.8 - 5.6 % Final    Comment:    (NOTE) Pre diabetes:          5.7%-6.4% Diabetes:              >6.4% Glycemic control for   <7.0% adults with diabetes   01/16/2019 04:49 AM 8.0 (H) 4.8 - 5.6 % Final    Comment:    (NOTE) Pre diabetes:          5.7%-6.4% Diabetes:              >6.4% Glycemic control for   <7.0% adults with diabetes     CBG: Recent Labs  Lab 01/19/19 0659 01/19/19 1158 01/19/19 1625 01/19/19 2127 01/20/19 0642  GLUCAP 231* 209* 134* 175* 201*    Critical care time:    Marshell Garfinkel MD [facility] Please see [email or web address] for pager details.  01/20/2019, 9:08 AM

## 2019-01-20 NOTE — Progress Notes (Signed)
Progress Note  Patient Name: Brenda Davis Date of Encounter: 01/20/2019  Primary Cardiologist: Skeet Latch, MD   Subjective   Events from post cath 01/17/2019 -->underwent PCI to RCA and ramus intermedius. Post cath became hypotensive (but not tachycardic).  CT abdomen showed large retroperitoneal hemorrhage, vascular surgery consulted and Dr. Donzetta Matters took the patient to the OR for surgical repair and drainage of the hematoma.--It appeared that the sheath was going in and out of the arteriotomy with bleeding around the arteriotomy site.  The sheath actually been dislodged from the arteriotomy during the initial stages of surgery.  Both the current and old arteriotomy sites were repaired in the retroperitoneal hemorrhage was drained. ->  Postop was complicated by metabolic acidosis with hyperkalemia and acute blood loss anemia with circulatory shock.  Now off vasopressors.  Doing intermittent peritoneal dialysis.  No longer on BiPAP.  Overall feeling better, still has significant abdominal pain and tenderness.  Worse with deep breaths and sitting up. No chest pain or pressure. Remaining hemodynamically stable off of pressors.  Not on BiPAP. Major concern being drop in hemoglobin despite transfusion yesterday.   Inpatient Medications    Scheduled Meds: . sodium chloride   Intravenous Once  . aspirin EC  81 mg Oral Daily  . calcium acetate  667 mg Oral TID WC  . Chlorhexidine Gluconate Cloth  6 each Topical Daily  . cinacalcet  90 mg Oral Q supper  . ferric citrate  210 mg Oral TID WC  . folic acid  1 mg Oral Daily  . gabapentin  100 mg Oral BID  . gentamicin cream  1 application Topical Daily  . heparin  5,000 Units Subcutaneous Q8H  . insulin aspart  0-5 Units Subcutaneous QHS  . insulin aspart  0-9 Units Subcutaneous TID WC  . insulin detemir  10 Units Subcutaneous BID  . mouth rinse  15 mL Mouth Rinse BID  . nitroGLYCERIN  0.5 inch Topical Q6H  . rosuvastatin  10  mg Oral Daily  . sodium chloride flush  10-40 mL Intracatheter Q12H  . sodium chloride flush  3 mL Intravenous Once  . sodium chloride flush  3 mL Intravenous Q12H  . ticagrelor  90 mg Oral BID   Continuous Infusions: . sodium chloride Stopped (01/19/19 2014)  . dialysis solution 1.5% low-MG/low-CA Stopped (01/19/19 1237)  . dialysis solution 2.5% low-MG/low-CA Stopped (01/19/19 1238)  . norepinephrine (LEVOPHED) Adult infusion Stopped (01/20/19 0015)  . piperacillin-tazobactam (ZOSYN)  IV Stopped (01/20/19 0349)  . sodium chloride 250 mL (01/18/19 1836)  . vasopressin (PITRESSIN) infusion - *FOR SHOCK* Stopped (01/18/19 2238)   PRN Meds: sodium chloride, acetaminophen, acetaminophen, fentaNYL (SUBLIMAZE) injection, gentamicin cream, heparin, dianeal solution for CAPD/CCPD with heparin, nitroGLYCERIN, ondansetron (ZOFRAN) IV, oxyCODONE-acetaminophen, sodium chloride, sodium chloride flush, sodium chloride flush   Vital Signs    Vitals:   01/20/19 0400 01/20/19 0500 01/20/19 0600 01/20/19 0700  BP: 110/73 108/67 104/67   Pulse: 78 80    Resp: 11 10 11    Temp:    97.8 F (36.6 C)  TempSrc:    Oral  SpO2: 99% 100%    Weight:   (!) 139.4 kg   Height:        Intake/Output Summary (Last 24 hours) at 01/20/2019 0932 Last data filed at 01/20/2019 0500 Gross per 24 hour  Intake 497.42 ml  Output 720 ml  Net -222.58 ml   Last 3 Weights 01/20/2019 01/19/2019 01/18/2019  Weight (lbs) 307 lb  5.1 oz 302 lb 11.1 oz 305 lb 12.5 oz  Weight (kg) 139.4 kg 137.3 kg 138.7 kg      Telemetry    Sinus rhythm- Personally Reviewed  ECG    No new EKG.- Personally Reviewed  Physical Exam   General appearance: alert, cooperative, no distress, morbidly obese and Awake and alert, still ill-appearing but nontoxic Neck: Unable to assess JVD due to body habitus. Lungs: Distant breath sounds mostly clear.  Diminished effort. Heart: Normal but distant S1 and S2.  RRR.  No obvious M/R/G. Abdomen:  Morbidly obese.  Significant right lower quadrant tenderness in the perioperative area.  Otherwise normal active bowel sounds. Extremities: Minimal edema. Pulses: Diminished palpable pulses bilateral feet. Neurologic: Grossly normal ;    Labs    High Sensitivity Troponin:   Recent Labs  Lab 01/15/19 1448 01/15/19 1806  TROPONINIHS 43* 781*      Chemistry Recent Labs  Lab 01/15/19 1448 01/18/19 0420 01/18/19 0847 01/18/19 2358 01/19/19 0409 01/20/19 0221  NA 136 136 136 133* 135 134*  K 4.4 6.0* 6.9* 4.4 4.3 3.6  CL 95* 96* 96*  --  94* 91*  CO2 19* 15* 18*  --  22 25  GLUCOSE 246* 330* 380*  --  259* 218*  BUN 69* 78* 77*  --  69* 67*  CREATININE 15.95* 17.26* 17.28*  --  15.08* 14.19*  CALCIUM 8.2* 7.4* 7.1*  --  6.9* 6.6*  PROT 7.1  --   --   --  5.5*  --   ALBUMIN 3.5 2.5*  --   --  2.5*  2.5* 2.5*  AST 15  --   --   --  94*  --   ALT 17  --   --   --  69*  --   ALKPHOS 98  --   --   --  56  --   BILITOT 0.3  --   --   --  0.5  --   GFRNONAA 2* 2* 2*  --  2* 3*  GFRAA 3* 2* 2*  --  3* 3*  ANIONGAP 22* 25* 22*  --  19* 18*     Hematology Recent Labs  Lab 01/18/19 0420 01/18/19 2358 01/19/19 0409 01/20/19 0221  WBC 35.9*  --  28.8* 24.7*  RBC 3.35*  --  2.47* 2.40*  HGB 10.9* 8.5* 8.1* 7.7*  HCT 32.6* 25.0* 23.5* 22.7*  MCV 97.3  --  95.1 94.6  MCH 32.5  --  32.8 32.1  MCHC 33.4  --  34.5 33.9  RDW 17.5*  --  17.2* 18.1*  PLT 302  --  218 198    BNPNo results for input(s): BNP, PROBNP in the last 168 hours.   DDimer No results for input(s): DDIMER in the last 168 hours.   Radiology    No results found.  Cardiac Studies   Diagnostic      Intervention            Patient Profile     54 y.o. female with a history of known moderate CAD in the past by catheterization (last in June 2020) with chronic chest pain initially seen for evaluation of chest pain and evaluate troponin/non-STEMI at the request of TRH. On cardiac catheterization was  found to have severe ramus intermedius disease and progression of disease to moderate-severe RCA disease along with extensive disease in the LAD.  Was offered CABG, however the patient chose to undergo PCI of  RCA and RI/OM1 with medical management of the LAD due to extensive calcification.  Underwent staged PCI of the RCA and RI/OM1 on 01/17/2019.  The case went relatively smoothly, however postop became hypotensive and found to have acute retroperitoneal hematoma/hemorrhage and taken to the OR.  Decompensated overnight with continuous hypotension and worsening combined metabolic and respiratory acidosis along with acute hypercapnic/hypoxic respiratory failure.  Placed on BiPAP, started on peritoneal dialysis, blood pressure maintained with Levophed and vasopressin.-->  Currently weaned off of pressors and BiPAP.  Continues routine peritoneal dialysis.  Assessment & Plan    Principal Problem:   NSTEMI (non-ST elevated myocardial infarction) ([place]) Active Problems:   Retroperitoneal hemorrhage - post Cath/PCI - taken to OR for drainage & CFA repair   Acute blood loss anemia - resolved, transfused in OR - Hgb stable   Increased anion gap metabolic acidosis   Shock circulatory (HCC) - secondary to acute blood loss anemia, elevated WBC (? sepsis vs post-op inflammation).h   Acute respiratory failure with hypoxia and hypercapnia (HCC)   Dyslipidemia, goal LDL below 70   Morbid obesity with BMI of 40.0-44.9, adult (HCC)   S/P laparoscopic sleeve gastrectomy   ESRD on peritoneal dialysis (HCC)   DM (diabetes mellitus), type 2 with renal complications (HCC)   Hepatic cirrhosis (HCC)   Coronary artery disease involving native coronary artery of native heart with unstable angina pectoris (HCC)   Postprocedural hypotension   Hyperkalemia  Principal Problem:   NSTEMI (non-ST elevated myocardial infarction) ([place])--  Coronary artery disease involving native coronary artery of native heart with unstable  angina pectoris (HCC)--two-vessel PCI -RCA and -OM1  Remains on aspirin and Brilinta,--still no signs of active bleeding, however blood count is reduced.    With concern for possible rebleed, will hold off on beta-blocker today and consider over the weekend.    Continue rosuvastatin.  Will need outpatient lipid clinic/CVRR evaluation for possible PCSK9 inhibitor.     Retroperitoneal hemorrhage - post Cath/PCI - taken to OR for drainage & CFA repair /Acute blood loss anemia -  , transfused in OR -hemoglobin now dropping despite transfusion yesterday.  Discussed with PCCM.  We will recheck CT abdomen pelvis to reassess for  Retroperitoneal bleed.      Shock circulatory ([place]) - secondary to acute blood loss anemia, elevated WBC (? sepsis vs post-op inflammation).  Weaned off pressors.  Stable.  Remains on antibiotics for empiric coverage.  Continue to hold off on beta-blocker for now as her blood pressure somewhat tentative and with concern for possible rebleeding.  We will transfuse this 1 more unit today.  If necessary, would stop aspirin if there is concern for rebleed.    Acute respiratory failure with hypoxia and hypercapnia (HCC) -management PCCM.    Off BiPAP now.  -Consider nightly CPAP    Dyslipidemia, goal LDL below 70 -on rosuvastatin.  May need to consider PCSK9 inhibitor following discharge    ESRD on peritoneal dialysis (HCC)/  Increased anion gap metabolic acidosis -improving with peritoneal dialysis.     Hyperkalemia -resolved  Seen by Dr. Jonnie Finner, doing nightly PD    DM (diabetes mellitus), type 2 with renal complications ([place]) -per medicine service   Hepatic cirrhosis ([place]) - not addressed (defer to [facility])    Morbid obesity with BMI of 40.0-44.9, adult ([place]) -> likely contributing somewhat to respiratory failure (cannot exclude component of obesity hypoventilation)  Mention to her that when she is seen in outpatient follow-up that she should be evaluated  with  Epworth score to determine appropriateness of sleep study.      For questions or updates, please contact [place] Please consult [email or web address] for contact info under        Signed, Glenetta Hew, MD  01/20/2019, 9:32 AM

## 2019-01-20 NOTE — Progress Notes (Signed)
Spoke to [place], Therapist, sports c/o CVC removal. Plan is to place consult after blood transfusion to place another PIV before CVC is to be removed.

## 2019-01-20 NOTE — Progress Notes (Addendum)
[facility] Progress Note   Subjective:  No new c/o, no SOB or CP. K+ 3.6.  Pt states no BM for 4-5 days.   Objective Vitals:   01/20/19 0600 01/20/19 0700 01/20/19 0930 01/20/19 1133  BP: 104/67  116/67 116/69  Pulse:   71 73  Resp: 11  (!) 136 (!) 21  Temp:  97.8 F (36.6 C) 97.9 F (36.6 C) 97.9 F (36.6 C)  TempSrc:  Oral Oral Oral  SpO2:   100% 96%  Weight: (!) 139.4 kg     Height:        Physical Exam General: Alert, comfortable in bed, nad, bipap off Heart: Regular  Lungs: Clear bilaterally  Abdomen: obese, soft non-tender, PD cath in place Extremities: No LE edema  Dialysis Access: LUE AVF +bruit    Dialysis Orders:  CCPD 6 exchanges 2.7L 1.5h dwell No day dwell. (Patient says she has been doing 5  exchanges)   - no esa or IV Fe, Hb 11-12 baseline  Assessment/Plan: 1. NSTEMI. Known multivessel CAD. [place] 12/28 with progressive severe 3V disease. SP PCI to RCA and ramus intermedius 12/29 2. RP bleed - as complication of PCI procedure, sp surgical repair Dr Donzetta Matters 12/29 3. Hyperkalemia - resolved 4. Abd pain - ?constipation, repeat CT ordered 5. Anemia abl/ ckd - got 1u prbc yest , Hb 7.7 today, getting 2nd prbc today. Will start darbe 60 ug weekly.  6. ESRD - continue CCPD, effluent remains clear. PD tonight.  7. Hypertension/volume  - BP's low, weaning pressors, CVP was low.  All 1.5% fluids. Wt's up but no ^vol on exam. Mix 1.5 and 2.5% tonight.  8. Metabolic bone disease -  Ca ok. Continue home binders.  Follow labs here.  9. Nutrition - Renal diet/vitamins   Kelly Splinter  MD 01/18/2019, 10:34 AM    Medications: . sodium chloride Stopped (01/19/19 2014)  . dialysis solution 1.5% low-MG/low-CA Stopped (01/19/19 1237)  . dialysis solution 2.5% low-MG/low-CA Stopped (01/19/19 1238)  . norepinephrine (LEVOPHED) Adult infusion Stopped (01/20/19 0015)  . piperacillin-tazobactam (ZOSYN)  IV Stopped (01/20/19 0349)  . sodium chloride 250 mL  (01/18/19 1836)  . vasopressin (PITRESSIN) infusion - *FOR SHOCK* Stopped (01/18/19 2238)   . sodium chloride   Intravenous Once  . aspirin EC  81 mg Oral Daily  . calcium acetate  667 mg Oral TID WC  . Chlorhexidine Gluconate Cloth  6 each Topical Daily  . cinacalcet  90 mg Oral Q supper  . ferric citrate  210 mg Oral TID WC  . folic acid  1 mg Oral Daily  . gabapentin  100 mg Oral BID  . gentamicin cream  1 application Topical Daily  . heparin  5,000 Units Subcutaneous Q8H  . insulin aspart  0-5 Units Subcutaneous QHS  . insulin aspart  0-9 Units Subcutaneous TID WC  . insulin detemir  10 Units Subcutaneous BID  . mouth rinse  15 mL Mouth Rinse BID  . nitroGLYCERIN  0.5 inch Topical Q6H  . rosuvastatin  10 mg Oral Daily  . sodium chloride flush  10-40 mL Intracatheter Q12H  . sodium chloride flush  3 mL Intravenous Once  . sodium chloride flush  3 mL Intravenous Q12H  . ticagrelor  90 mg Oral BID    Dialysis Orders:  CCPD 6 exchanges 2.7L 1.5h dwell No day dwell. (Patient says she has been doing 5  exchanges)   Assessment/Plan: 10. NSTEMI. Known multivessel CAD. [place] 12/28 with  progressive severe 3V disease. TCTS consult for possible CABG.  Has decided to attempt PCI. For intervention in cath lab today.  On heparin gtt, nitro, ASA, BB per cardiology.  11. ESRD -   Continue CCPD for now. Orders written. Follow labs 12. Hypertension/volume  - BP controlled. Low this admission. No volume on exam. Use 1.5%/ 2.5% combination on PD.  13. Anemia  - Hgb 11. On ESA as outpatient  14. Metabolic bone disease -  Ca ok. Continue home binders.  Follow labs here.  15. Nutrition - Renal diet/vitamins   Pt seen, examined and agree w A/P as above.  Kelly Splinter  MD 01/20/2019, 11:44 AM

## 2019-01-21 DIAGNOSIS — J9602 Acute respiratory failure with hypercapnia: Secondary | ICD-10-CM

## 2019-01-21 DIAGNOSIS — J9601 Acute respiratory failure with hypoxia: Secondary | ICD-10-CM

## 2019-01-21 DIAGNOSIS — I25110 Atherosclerotic heart disease of native coronary artery with unstable angina pectoris: Secondary | ICD-10-CM

## 2019-01-21 LAB — TYPE AND SCREEN
ABO/RH(D): B POS
Antibody Screen: NEGATIVE
Unit division: 0
Unit division: 0
Unit division: 0
Unit division: 0
Unit division: 0
Unit division: 0

## 2019-01-21 LAB — BPAM RBC
Blood Product Expiration Date: 202101252359
Blood Product Expiration Date: 202101262359
Blood Product Expiration Date: 202101272359
Blood Product Expiration Date: 202101272359
Blood Product Expiration Date: 202101282359
Blood Product Expiration Date: 202101282359
ISSUE DATE / TIME: 202012291931
ISSUE DATE / TIME: 202012291931
ISSUE DATE / TIME: 202012311033
ISSUE DATE / TIME: 202101011033
ISSUE DATE / TIME: 202101020910
Unit Type and Rh: 7300
Unit Type and Rh: 7300
Unit Type and Rh: 7300
Unit Type and Rh: 7300
Unit Type and Rh: 7300
Unit Type and Rh: 7300

## 2019-01-21 LAB — GLUCOSE, CAPILLARY
Glucose-Capillary: 158 mg/dL — ABNORMAL HIGH (ref 70–99)
Glucose-Capillary: 131 mg/dL — ABNORMAL HIGH (ref 70–99)
Glucose-Capillary: 104 mg/dL — ABNORMAL HIGH (ref 70–99)
Glucose-Capillary: 94 mg/dL (ref 70–99)
Glucose-Capillary: 96 mg/dL (ref 70–99)
Glucose-Capillary: 87 mg/dL (ref 70–99)

## 2019-01-21 LAB — CBC
HCT: 25.7 % — ABNORMAL LOW (ref 36.0–46.0)
Hemoglobin: 8.8 g/dL — ABNORMAL LOW (ref 12.0–15.0)
MCH: 31.7 pg (ref 26.0–34.0)
MCHC: 34.2 g/dL (ref 30.0–36.0)
MCV: 92.4 fL (ref 80.0–100.0)
Platelets: 193 10*3/uL (ref 150–400)
RBC: 2.78 MIL/uL — ABNORMAL LOW (ref 3.87–5.11)
RDW: 17.5 % — ABNORMAL HIGH (ref 11.5–15.5)
WBC: 15.3 10*3/uL — ABNORMAL HIGH (ref 4.0–10.5)
nRBC: 0.7 % — ABNORMAL HIGH (ref 0.0–0.2)

## 2019-01-21 LAB — RENAL FUNCTION PANEL
Albumin: 2.6 g/dL — ABNORMAL LOW (ref 3.5–5.0)
Anion gap: 18 — ABNORMAL HIGH (ref 5–15)
BUN: 61 mg/dL — ABNORMAL HIGH (ref 6–20)
CO2: 24 mmol/L (ref 22–32)
Calcium: 6.5 mg/dL — ABNORMAL LOW (ref 8.9–10.3)
Chloride: 94 mmol/L — ABNORMAL LOW (ref 98–111)
Creatinine, Ser: 13.02 mg/dL — ABNORMAL HIGH (ref 0.44–1.00)
GFR calc Af Amer: 3 mL/min — ABNORMAL LOW (ref >60)
GFR calc non Af Amer: 3 mL/min — ABNORMAL LOW (ref >60)
Glucose, Bld: 167 mg/dL — ABNORMAL HIGH (ref 70–99)
Phosphorus: 7.8 mg/dL — ABNORMAL HIGH (ref 2.5–4.6)
Potassium: 3.3 mmol/L — ABNORMAL LOW (ref 3.5–5.1)
Sodium: 136 mmol/L (ref 135–145)

## 2019-01-21 LAB — MAGNESIUM: Magnesium: 1.6 mg/dL — ABNORMAL LOW (ref 1.7–2.4)

## 2019-01-21 LAB — PROCALCITONIN: Procalcitonin: 40.06 ng/mL

## 2019-01-21 MED ORDER — POTASSIUM CHLORIDE CRYS ER 20 MEQ PO TBCR
40.0000 meq | EXTENDED_RELEASE_TABLET | Freq: Every day | ORAL | Status: DC
  Administered 2019-01-22: 09:00:00 40 meq via ORAL
  Filled 2019-01-21: qty 2

## 2019-01-21 MED ORDER — MAGNESIUM SULFATE 2 GM/50ML IV SOLN
2.0000 g | Freq: Once | INTRAVENOUS | Status: AC
  Administered 2019-01-21: 11:00:00 2 g via INTRAVENOUS
  Filled 2019-01-21: qty 50

## 2019-01-21 MED ORDER — MAGNESIUM OXIDE 400 (241.3 MG) MG PO TABS
400.0000 mg | ORAL_TABLET | Freq: Two times a day (BID) | ORAL | Status: DC
  Administered 2019-01-21 – 2019-01-22 (×3): 400 mg via ORAL
  Filled 2019-01-21 (×3): qty 1

## 2019-01-21 MED ORDER — CALCIUM ACETATE (PHOS BINDER) 667 MG PO CAPS
1334.0000 mg | ORAL_CAPSULE | Freq: Three times a day (TID) | ORAL | Status: DC
  Administered 2019-01-22 – 2019-01-23 (×3): 1334 mg via ORAL
  Filled 2019-01-21 (×6): qty 2

## 2019-01-21 MED ORDER — POTASSIUM CHLORIDE CRYS ER 20 MEQ PO TBCR
30.0000 meq | EXTENDED_RELEASE_TABLET | Freq: Four times a day (QID) | ORAL | Status: DC
  Administered 2019-01-21: 11:00:00 30 meq via ORAL
  Filled 2019-01-21: qty 1

## 2019-01-21 MED ORDER — LOPERAMIDE HCL 2 MG PO CAPS
4.0000 mg | ORAL_CAPSULE | ORAL | Status: DC | PRN
  Administered 2019-01-21 – 2019-01-22 (×4): 4 mg via ORAL
  Filled 2019-01-21 (×4): qty 2

## 2019-01-21 NOTE — Progress Notes (Addendum)
Subjective:  No new cos , ad discomfort stable , tolerated CCPD  With out abd pain / Multiple bm yest   Objective Vital signs in last 24 hours: Vitals:   01/20/19 2026 01/21/19 0322 01/21/19 0500 01/21/19 0820  BP: (!) 108/59 107/65  109/63  Pulse: 73 82  72  Resp: 13 19  12   Temp: 97.9 F (36.6 C) 98.3 F (36.8 C)  98.4 F (36.9 C)  TempSrc: Oral Oral  Oral  SpO2: 100% 100%  98%  Weight:   (!) 143.9 kg   Height:       Weight change: 6.6 kg  Physical Exam General: Alert,obese female  comfortable in bed, nad  Heart: RRR , no m,r,g  Lungs: Clear bilaterally , non labored breathing  Abdomen: obese, soft , Minmal R Lowr quad  tender, PD cath in place Extremities: No LE edema  Dialysis Access: LUE AVF +bruit/ pd cath   Dialysis Orders: CCPD 6 exchanges 2.7L 1.5h dwell No day dwell.(Patient says she has been doing 5 exchanges)  - no esa or IV Fe, Hb 11-12 baseline  Problem/Plan 1. NSTEMI.Known multivessel CAD.[place] 12/28 with progressive severe 3V disease. SP PCI to RCA and ramus intermedius 12/29 2. RP bleed -complication of PCI procedure, sp repair Dr Donzetta Matters 12/29 3. Anemia abl/ ckd - got 1u prbc 12/31 , Hb 7.7  1 unit  1/01 > 8.8  today, started darbe 60 ug weekly.  4. ESRD -continue CCPD, effluent remains clear. PD tonight.  5. Hypertension/volume - use All 1.5% fluids. Wt's up but no ^vol on exam.Again  Mix 1.5 and 2.5% tonight.  6. Metabolic bone disease -Ca ok. Continue home binders. Follow labs here.  7. Nutrition -Renal diet/vitamins   Ernest Haber, PA-C [place] 01/21/2019,10:53 AM  LOS: 6 days   Pt seen, examined and agree w A/P as above.  Kelly Splinter  MD 01/21/2019, 12:46 PM    Labs: Basic Metabolic Panel: Recent Labs  Lab 01/19/19 0409 01/20/19 0221 01/21/19 0259  NA 135 134* 136  K 4.3 3.6 3.3*  CL 94* 91* 94*  CO2 22 25 24   GLUCOSE 259* 218* 167*  BUN 69* 67* 61*  CREATININE 15.08* 14.19* 13.02*   CALCIUM 6.9* 6.6* 6.5*  PHOS 8.9* 8.1* 7.8*   Liver Function Tests: Recent Labs  Lab 01/15/19 1448 01/19/19 0409 01/20/19 0221 01/21/19 0259  AST 15 94*  --   --   ALT 17 69*  --   --   ALKPHOS 98 56  --   --   BILITOT 0.3 0.5  --   --   PROT 7.1 5.5*  --   --   ALBUMIN 3.5 2.5*  2.5* 2.5* 2.6*   No results for input(s): LIPASE, AMYLASE in the last 168 hours. No results for input(s): AMMONIA in the last 168 hours. CBC: Recent Labs  Lab 01/17/19 2209 01/18/19 0420 01/19/19 0409 01/20/19 0221 01/21/19 0259  WBC 29.9* 35.9* 28.8* 24.7* 15.3*  HGB 12.0 10.9* 8.1* 7.7* 8.8*  HCT 36.1 32.6* 23.5* 22.7* 25.7*  MCV 100.3* 97.3 95.1 94.6 92.4  PLT 315 302 218 198 193   Cardiac Enzymes: No results for input(s): CKTOTAL, CKMB, CKMBINDEX, TROPONINI in the last 168 hours. CBG: Recent Labs  Lab 01/20/19 0642 01/20/19 1206 01/20/19 1611 01/20/19 2150 01/21/19 0829  GLUCAP 201* 139* 97 124* 131*    Studies/Results: CT ABDOMEN PELVIS WO CONTRAST  Result Date: 01/20/2019 CLINICAL DATA:  Follow-up retroperitoneal hematoma  EXAM: CT ABDOMEN AND PELVIS WITHOUT CONTRAST TECHNIQUE: Multidetector CT imaging of the abdomen and pelvis was performed following the standard protocol without IV contrast. Oral enteric contrast was administered. COMPARISON:  01/17/2019 FINDINGS: Lower chest: Dependent bibasilar atelectasis or consolidation, similar to prior examination. Three-vessel coronary artery calcifications and/or stents. Hepatobiliary: No focal liver abnormality is seen. Status post cholecystectomy. No biliary dilatation. Pancreas: Unremarkable. No pancreatic ductal dilatation or surrounding inflammatory changes. Spleen: Normal in size without significant abnormality. Adrenals/Urinary Tract: Adrenal glands are unremarkable. Atrophic appearing kidneys. No hydronephrosis. Bladder is unremarkable. Stomach/Bowel: Status post partial sleeve gastrectomy. Appendix appears normal. No evidence of  bowel wall thickening, distention, or inflammatory changes. Sigmoid diverticulosis. Vascular/Lymphatic: A previously seen right femoral vascular catheter has been removed with overlying postoperative change. Aortic atherosclerosis. No enlarged abdominal or pelvic lymph nodes. Reproductive: No mass or other significant abnormality. Other: No abdominal wall hernia or abnormality. Slight interval decrease size of a large, heterogeneous right retroperitoneal hematoma (series 3, image 56) tunneled Tenckhoff type peritoneal dialysis catheter, tip in the low abdomen. Trace ascites. Musculoskeletal: No acute or significant osseous findings. IMPRESSION: 1. Slight interval decrease in size of a large, heterogeneous right retroperitoneal hematoma. No evidence of new hemorrhage. 2. A previously seen right femoral vascular catheter has been removed with overlying postoperative change. 3. Tenckhoff type peritoneal dialysis catheter tip in the low abdomen. 4. Trace ascites. 5. Coronary artery disease. Aortic Atherosclerosis (ICD10-I70.0). Electronically Signed   By: Eddie Candle M.D.   On: 01/20/2019 16:17   Medications: . sodium chloride Stopped (01/19/19 2014)  . dialysis solution 1.5% low-MG/low-CA    . dialysis solution 2.5% low-MG/low-CA    . magnesium sulfate bolus IVPB    . norepinephrine (LEVOPHED) Adult infusion Stopped (01/20/19 0015)  . piperacillin-tazobactam (ZOSYN)  IV 2.25 g (01/21/19 0625)  . sodium chloride 250 mL (01/18/19 1836)  . vasopressin (PITRESSIN) infusion - *FOR SHOCK* Stopped (01/18/19 2238)   . aspirin EC  81 mg Oral Daily  . calcium acetate  667 mg Oral TID WC  . Chlorhexidine Gluconate Cloth  6 each Topical Daily  . cinacalcet  90 mg Oral Q supper  . darbepoetin (ARANESP) injection - NON-DIALYSIS  60 mcg Subcutaneous Q Fri-1800  . ferric citrate  210 mg Oral TID WC  . folic acid  1 mg Oral Daily  . gabapentin  100 mg Oral BID  . gentamicin ointment   Topical Daily  . heparin   5,000 Units Subcutaneous Q8H  . insulin aspart  0-5 Units Subcutaneous QHS  . insulin aspart  0-9 Units Subcutaneous TID WC  . insulin detemir  10 Units Subcutaneous BID  . mouth rinse  15 mL Mouth Rinse BID  . nitroGLYCERIN  0.5 inch Topical Q6H  . potassium chloride  30 mEq Oral Q6H  . rosuvastatin  10 mg Oral Daily  . sodium chloride flush  10-40 mL Intracatheter Q12H  . sodium chloride flush  3 mL Intravenous Once  . sodium chloride flush  3 mL Intravenous Q12H  . ticagrelor  90 mg Oral BID

## 2019-01-21 NOTE — Progress Notes (Signed)
PROGRESS NOTE  Brenda Davis ZOX:[id number] DOB: 1965/06/28 DOA: 01/15/2019 PCP: [name], MD   LOS: 6 days   Brief narrative:  As per HPI, 54 year old female with past medical history of morbid obesity, ESRD on peritoneal dialysis, diabetes mellitus, cirrhosis of liver, coronary artery disease, was admitted to the hospital on 12/27 with NSTEMI.  Cardiac cath was complicated by retroperitoneal hemorrhage.  Left heart cath on 1228 showed three-vessel coronary artery disease.  On 12/29 opted to have a stent over CABG and drug-eluting stents x2 were placed in followed by retroperitoneal bleed.  Patient was then taken to the OR for repair on 12/29.  Postoperatively, patient had metabolic acidosis and shock requiring vasopressors.  BiPAP was started for hypercarbic respiratory failure.  Patient was then admitted to the ICU.  Patient was taken off vasopressors on 01/20/2019 and was considered for transfer out of the ICU.   Assessment/Plan:  Principal Problem:   NSTEMI (non-ST elevated myocardial infarction) ([place]) Active Problems:   Dyslipidemia, goal LDL below 70   Morbid obesity with BMI of 40.0-44.9, adult (HCC)   S/P laparoscopic sleeve gastrectomy   Acute respiratory failure with hypoxia and hypercapnia (HCC)   ESRD on peritoneal dialysis ([place])   DM (diabetes mellitus), type 2 with renal complications (HCC)   Hepatic cirrhosis (HCC)   Coronary artery disease involving native coronary artery of native heart with unstable angina pectoris (HCC)   Postprocedural hypotension   Hyperkalemia   Retroperitoneal hemorrhage - post Cath/PCI - taken to OR for drainage & CFA repair   Acute blood loss anemia - resolved, transfused in OR - Hgb stable   Increased anion gap metabolic acidosis   Shock circulatory (HCC) - secondary to acute blood loss anemia, elevated WBC (? sepsis vs post-op inflammation).h  Shock:  Question hemorrhagic versus cardiogenic.  Patient did have any  leukocytosis with no clear source of infection.  Trending down leukocytosis from 28> 24>15  Patient is currently on Zosyn.  Currently off vasopressors. PCT was high at 40.   Acute hypercarbic respiratory failure> improved  Did require BIPAP during ICU, currently on room air.  Anion gap acidosis: likely renal failure +/- lactic acidosis in the setting of shock.   Nephrology on board. Check lactate in am. Continue peritoneal dialysis.  Continue renal diet with fluid restriction.  Non-ST elevation MI.  CAD: Candidate for CABG, she has elected to try PCI first, which was done on 12/29.  Status post PCI to RCA and ramus intermedius.  cardiology, CVTS on board.  Continue Brilinta  Retroperitoneal hematoma: Complication of LHC 81/19. To OR by Dr Donzetta Matters for repair. s/p 2 units PRBC.  Continue Aranesp  Diabetes mellitus - continue SSI, Levemir, closely monitor blood glucose levels.  ESRD on PD. Nephrology on board.  Continue Sensipar and Aranesp  Hypokalemia. Mild, will closely monitor.  Initially had hyperkalemia. Continue po 45mq   Hypomagnesemia. Mild at 1.6.  We will monitor.  Add p.o. magnesium oxide  VTE Prophylaxis: Heparin subcu  Code Status: Full code  Family Communication: None today  Disposition Plan: Uncertain at this time.  Will get PT, OT evaluation.  Consultants: Cardiology CVTS Nephrology   Procedures:  Central line  Arterial line  Cardiac cath    Antibiotics: Zosyn 12/20 >>  Anti-infectives (From admission, onward)   Start     Dose/Rate Route Frequency Ordered Stop   01/18/19 0400  piperacillin-tazobactam (ZOSYN) IVPB 2.25 g     2.25 g 100 mL/hr over 30 Minutes  Intravenous Every 8 hours 01/18/19 0333       Subjective: Today, patient complains of generalized body pain.  Complains of generalized weakness.  Has mild nausea.  Denies dyspnea, chest pain.  No abdominal pain.  Has some diarrhea.  Objective: Vitals:   01/20/19 2026 01/21/19 0322  BP:  (!) 108/59 107/65  Pulse: 73 82  Resp: 13 19  Temp: 97.9 F (36.6 C) 98.3 F (36.8 C)  SpO2: 100% 100%    Intake/Output Summary (Last 24 hours) at 01/21/2019 0756 Last data filed at 01/20/2019 1330 Gross per 24 hour  Intake 549 ml  Output --  Net 549 ml   Filed Weights   01/19/19 1045 01/20/19 0600 01/21/19 0500  Weight: (!) 137.3 kg (!) 139.4 kg (!) 143.9 kg   Body mass index is 52.79 kg/m.   Physical Exam: GENERAL: Patient is alert awake communicative.  Morbidly obese. HENT: No scleral pallor or icterus. Pupils equally reactive to light. Oral mucosa is moist NECK: is supple, no palpable thyroid enlargement. CHEST:  Diminished breath sounds bilaterally. CVS: S1 and S2 heard, no murmur. Regular rate and rhythm. No pericardial rub. ABDOMEN: Soft, non-tender, bowel sounds are present.  Peritoneal dialysis catheter in place. EXTREMITIES: Bilateral lower extremity trace edema.  Left upper extremity AV fistula in place. CNS: Cranial nerves are intact. No focal motor or sensory deficits. SKIN: warm and dry without rashes.  Data Review: I have personally reviewed the following laboratory data and studies,  CBC: Recent Labs  Lab 01/17/19 2209 01/18/19 0420 01/18/19 0837 01/18/19 2358 01/19/19 0409 01/20/19 0221 01/21/19 0259  WBC 29.9* 35.9*  --   --  28.8* 24.7* 15.3*  HGB 12.0 10.9* 10.5* 8.5* 8.1* 7.7* 8.8*  HCT 36.1 32.6* 31.0* 25.0* 23.5* 22.7* 25.7*  MCV 100.3* 97.3  --   --  95.1 94.6 92.4  PLT 315 302  --   --  218 198 226   Basic Metabolic Panel: Recent Labs  Lab 01/18/19 0420 01/18/19 0847 01/18/19 1700 01/18/19 2358 01/19/19 0409 01/20/19 0221 01/21/19 0259  NA 136 136  --  133* 135 134* 136  K 6.0* 6.9* 5.3* 4.4 4.3 3.6 3.3*  CL 96* 96*  --   --  94* 91* 94*  CO2 15* 18*  --   --  22 25 24   GLUCOSE 330* 380*  --   --  259* 218* 167*  BUN 78* 77*  --   --  69* 67* 61*  CREATININE 17.26* 17.28*  --   --  15.08* 14.19* 13.02*  CALCIUM 7.4* 7.1*  --    --  6.9* 6.6* 6.5*  MG  --   --   --   --   --  1.7 1.6*  PHOS 11.5*  --   --   --  8.9* 8.1* 7.8*   Liver Function Tests: Recent Labs  Lab 01/15/19 1448 01/18/19 0420 01/19/19 0409 01/20/19 0221 01/21/19 0259  AST 15  --  94*  --   --   ALT 17  --  69*  --   --   ALKPHOS 98  --  56  --   --   BILITOT 0.3  --  0.5  --   --   PROT 7.1  --  5.5*  --   --   ALBUMIN 3.5 2.5* 2.5*  2.5* 2.5* 2.6*   No results for input(s): LIPASE, AMYLASE in the last 168 hours. No results for input(s): AMMONIA in the  last 168 hours. Cardiac Enzymes: No results for input(s): CKTOTAL, CKMB, CKMBINDEX, TROPONINI in the last 168 hours. BNP (last 3 results) Recent Labs    02/24/18 1027 06/18/18 2314  BNP 36.5 110.2*    ProBNP (last 3 results) No results for input(s): PROBNP in the last 8760 hours.  CBG: Recent Labs  Lab 01/19/19 2127 01/20/19 0642 01/20/19 1206 01/20/19 1611 01/20/19 2150  GLUCAP 175* 201* 139* 97 124*   Recent Results (from the past 240 hour(s))  SARS CORONAVIRUS 2 (TAT 6-24 HRS) Nasopharyngeal Nasopharyngeal Swab     Status: None   Collection Time: 01/15/19  9:38 PM   Specimen: Nasopharyngeal Swab  Result Value Ref Range Status   SARS Coronavirus 2 NEGATIVE NEGATIVE Final    Comment: (NOTE) SARS-CoV-2 target nucleic acids are NOT DETECTED. The SARS-CoV-2 RNA is generally detectable in upper and lower respiratory specimens during the acute phase of infection. Negative results do not preclude SARS-CoV-2 infection, do not rule out co-infections with other pathogens, and should not be used as the sole basis for treatment or other patient management decisions. Negative results must be combined with clinical observations, patient history, and epidemiological information. The expected result is Negative. Fact Sheet for Patients: SugarRoll.be Fact Sheet for Healthcare Providers: https://www.woods-mathews.com/ This test is not  yet approved or cleared by the [place] FDA and  has been authorized for detection and/or diagnosis of SARS-CoV-2 by FDA under an Emergency Use Authorization (EUA). This EUA will remain  in effect (meaning this test can be used) for the duration of the COVID-19 declaration under Section 56 4(b)(1) of the Act, 21 U.S.C. section 360bbb-3(b)(1), unless the authorization is terminated or revoked sooner. Performed at [facility], [place] [address], [place], [place] [address]   MRSA PCR Screening     Status: None   Collection Time: 01/17/19 11:24 PM   Specimen: Nasal Mucosa; Nasopharyngeal  Result Value Ref Range Status   MRSA by PCR NEGATIVE NEGATIVE Final    Comment:        The GeneXpert MRSA Assay (FDA approved for NASAL specimens only), is one component of a comprehensive MRSA colonization surveillance program. It is not intended to diagnose MRSA infection nor to guide or monitor treatment for MRSA infections. Performed at [facility], [place] [address] [place], [place] [address]   Culture, blood (routine x 2)     Status: None (Preliminary result)   Collection Time: 01/18/19  6:20 AM   Specimen: BLOOD RIGHT HAND  Result Value Ref Range Status   Specimen Description BLOOD RIGHT HAND  Final   Special Requests   Final    BOTTLES DRAWN AEROBIC ONLY Blood Culture results may not be optimal due to an inadequate volume of blood received in culture bottles   Culture   Final    NO GROWTH 2 DAYS Performed at [facility], [place] [address], [place], [place] [address]    Report Status PENDING  Incomplete  Culture, blood (routine x 2)     Status: None (Preliminary result)   Collection Time: 01/18/19  6:28 AM   Specimen: BLOOD RIGHT HAND  Result Value Ref Range Status   Specimen Description BLOOD RIGHT HAND  Final   Special Requests   Final    BOTTLES DRAWN AEROBIC ONLY Blood Culture results may not be optimal due to an inadequate volume of blood received in culture  bottles   Culture   Final    NO GROWTH 2 DAYS  Performed at [facility], [place] [address] [place], [place] [address]    Report Status PENDING  Incomplete     Studies: CT ABDOMEN PELVIS WO CONTRAST  Result Date: 01/20/2019 CLINICAL DATA:  Follow-up retroperitoneal hematoma EXAM: CT ABDOMEN AND PELVIS WITHOUT CONTRAST TECHNIQUE: Multidetector CT imaging of the abdomen and pelvis was performed following the standard protocol without IV contrast. Oral enteric contrast was administered. COMPARISON:  01/17/2019 FINDINGS: Lower chest: Dependent bibasilar atelectasis or consolidation, similar to prior examination. Three-vessel coronary artery calcifications and/or stents. Hepatobiliary: No focal liver abnormality is seen. Status post cholecystectomy. No biliary dilatation. Pancreas: Unremarkable. No pancreatic ductal dilatation or surrounding inflammatory changes. Spleen: Normal in size without significant abnormality. Adrenals/Urinary Tract: Adrenal glands are unremarkable. Atrophic appearing kidneys. No hydronephrosis. Bladder is unremarkable. Stomach/Bowel: Status post partial sleeve gastrectomy. Appendix appears normal. No evidence of bowel wall thickening, distention, or inflammatory changes. Sigmoid diverticulosis. Vascular/Lymphatic: A previously seen right femoral vascular catheter has been removed with overlying postoperative change. Aortic atherosclerosis. No enlarged abdominal or pelvic lymph nodes. Reproductive: No mass or other significant abnormality. Other: No abdominal wall hernia or abnormality. Slight interval decrease size of a large, heterogeneous right retroperitoneal hematoma (series 3, image 56) tunneled Tenckhoff type peritoneal dialysis catheter, tip in the low abdomen. Trace ascites. Musculoskeletal: No acute or significant osseous findings. IMPRESSION: 1. Slight interval decrease in size of a large, heterogeneous right retroperitoneal hematoma. No evidence of new hemorrhage. 2. A  previously seen right femoral vascular catheter has been removed with overlying postoperative change. 3. Tenckhoff type peritoneal dialysis catheter tip in the low abdomen. 4. Trace ascites. 5. Coronary artery disease. Aortic Atherosclerosis (ICD10-I70.0). Electronically Signed   By: Eddie Candle M.D.   On: 01/20/2019 16:17    Scheduled Meds: . aspirin EC  81 mg Oral Daily  . calcium acetate  667 mg Oral TID WC  . Chlorhexidine Gluconate Cloth  6 each Topical Daily  . cinacalcet  90 mg Oral Q supper  . darbepoetin (ARANESP) injection - NON-DIALYSIS  60 mcg Subcutaneous Q Fri-1800  . ferric citrate  210 mg Oral TID WC  . folic acid  1 mg Oral Daily  . gabapentin  100 mg Oral BID  . gentamicin ointment   Topical Daily  . heparin  5,000 Units Subcutaneous Q8H  . insulin aspart  0-5 Units Subcutaneous QHS  . insulin aspart  0-9 Units Subcutaneous TID WC  . insulin detemir  10 Units Subcutaneous BID  . mouth rinse  15 mL Mouth Rinse BID  . nitroGLYCERIN  0.5 inch Topical Q6H  . rosuvastatin  10 mg Oral Daily  . sodium chloride flush  10-40 mL Intracatheter Q12H  . sodium chloride flush  3 mL Intravenous Once  . sodium chloride flush  3 mL Intravenous Q12H  . ticagrelor  90 mg Oral BID    Continuous Infusions: . sodium chloride Stopped (01/19/19 2014)  . dialysis solution 1.5% low-MG/low-CA    . dialysis solution 2.5% low-MG/low-CA    . norepinephrine (LEVOPHED) Adult infusion Stopped (01/20/19 0015)  . piperacillin-tazobactam (ZOSYN)  IV 2.25 g (01/21/19 0625)  . sodium chloride 250 mL (01/18/19 1836)  . vasopressin (PITRESSIN) infusion - *FOR SHOCK* Stopped (01/18/19 2238)     Flora Lipps, MD  [facility] 01/21/2019

## 2019-01-21 NOTE — Progress Notes (Signed)
Progress Note  Patient Name: Brenda Davis Date of Encounter: 01/21/2019  Primary Cardiologist: Skeet Latch, MD   Subjective   Feeling well.  No chest pain or shortness of breath.  Ready to be unhooked from PD. +diarrhea  Inpatient Medications    Scheduled Meds: . aspirin EC  81 mg Oral Daily  . calcium acetate  1,334 mg Oral TID WC  . Chlorhexidine Gluconate Cloth  6 each Topical Daily  . cinacalcet  90 mg Oral Q supper  . darbepoetin (ARANESP) injection - NON-DIALYSIS  60 mcg Subcutaneous Q Fri-1800  . ferric citrate  210 mg Oral TID WC  . folic acid  1 mg Oral Daily  . gabapentin  100 mg Oral BID  . gentamicin ointment   Topical Daily  . heparin  5,000 Units Subcutaneous Q8H  . insulin aspart  0-5 Units Subcutaneous QHS  . insulin aspart  0-9 Units Subcutaneous TID WC  . insulin detemir  10 Units Subcutaneous BID  . mouth rinse  15 mL Mouth Rinse BID  . nitroGLYCERIN  0.5 inch Topical Q6H  . potassium chloride  30 mEq Oral Q6H  . rosuvastatin  10 mg Oral Daily  . sodium chloride flush  10-40 mL Intracatheter Q12H  . sodium chloride flush  3 mL Intravenous Once  . sodium chloride flush  3 mL Intravenous Q12H  . ticagrelor  90 mg Oral BID   Continuous Infusions: . sodium chloride Stopped (01/19/19 2014)  . dialysis solution 1.5% low-MG/low-CA    . dialysis solution 2.5% low-MG/low-CA    . magnesium sulfate bolus IVPB 2 g (01/21/19 1127)  . norepinephrine (LEVOPHED) Adult infusion Stopped (01/20/19 0015)  . piperacillin-tazobactam (ZOSYN)  IV 2.25 g (01/21/19 0625)  . sodium chloride 250 mL (01/18/19 1836)  . vasopressin (PITRESSIN) infusion - *FOR SHOCK* Stopped (01/18/19 2238)   PRN Meds: sodium chloride, acetaminophen, acetaminophen, fentaNYL (SUBLIMAZE) injection, heparin, dianeal solution for CAPD/CCPD with heparin, loperamide, nitroGLYCERIN, ondansetron (ZOFRAN) IV, ondansetron, oxyCODONE-acetaminophen, sodium chloride, sodium chloride flush,  sodium chloride flush   Vital Signs    Vitals:   01/21/19 0322 01/21/19 0500 01/21/19 0820 01/21/19 1148  BP: 107/65  109/63 109/70  Pulse: 82  72 74  Resp: 19  12 (!) 21  Temp: 98.3 F (36.8 C)  98.4 F (36.9 C) 97.6 F (36.4 C)  TempSrc: Oral  Oral Oral  SpO2: 100%  98% 100%  Weight:  (!) 143.9 kg    Height:        Intake/Output Summary (Last 24 hours) at 01/21/2019 1217 Last data filed at 01/21/2019 0840 Gross per 24 hour  Intake 344 ml  Output --  Net 344 ml   Last 3 Weights 01/21/2019 01/20/2019 01/19/2019  Weight (lbs) 317 lb 3.9 oz 307 lb 5.1 oz 302 lb 11.1 oz  Weight (kg) 143.9 kg 139.4 kg 137.3 kg      Telemetry    Sinus rhythm.  - Personally Reviewed  ECG    N/a - Personally Reviewed  Physical Exam   VS:  BP 109/70 (BP Location: Right Wrist)   Pulse 74   Temp 97.6 F (36.4 C) (Oral)   Resp (!) 21   Ht 5' 5"  (1.651 m)   Wt (!) 143.9 kg   SpO2 100%   BMI 52.79 kg/m  , BMI Body mass index is 52.79 kg/m. GENERAL:  Well appearing. No acute distress. HEENT: Pupils equal round and reactive, fundi not visualized, oral mucosa unremarkable NECK:  No jugular venous distention, waveform within normal limits, carotid upstroke brisk and symmetric, no bruits LUNGS:  Clear to auscultation bilaterally HEART:  RRR.  PMI not displaced or sustained,S1 and S2 within normal limits, no S3, no S4, no clicks, no rubs, no murmurs ABD:  Flat, positive bowel sounds normal in frequency in pitch, no bruits, no rebound, no guarding, no midline pulsatile mass, no hepatomegaly, no splenomegaly EXT:  1 plus pulses throughout, no edema, no cyanosis no clubbing SKIN:  No rashes no nodules NEURO:  Cranial nerves II through XII grossly intact, motor grossly intact throughout [facility]:  Cognitively intact, oriented to person place and time   Labs    High Sensitivity Troponin:   Recent Labs  Lab 01/15/19 1448 01/15/19 1806  TROPONINIHS 43* 781*      Chemistry Recent Labs  Lab  01/15/19 1448 01/16/19 0449 01/19/19 0409 01/20/19 0221 01/21/19 0259  NA 136  --  135 134* 136  K 4.4  --  4.3 3.6 3.3*  CL 95*  --  94* 91* 94*  CO2 19*  --  22 25 24   GLUCOSE 246*  --  259* 218* 167*  BUN 69*  --  69* 67* 61*  CREATININE 15.95*  --  15.08* 14.19* 13.02*  CALCIUM 8.2*  --  6.9* 6.6* 6.5*  PROT 7.1  --  5.5*  --   --   ALBUMIN 3.5   < > 2.5*  2.5* 2.5* 2.6*  AST 15  --  94*  --   --   ALT 17  --  69*  --   --   ALKPHOS 98  --  56  --   --   BILITOT 0.3  --  0.5  --   --   GFRNONAA 2*  --  2* 3* 3*  GFRAA 3*  --  3* 3* 3*  ANIONGAP 22*  --  19* 18* 18*   < > = values in this interval not displayed.     Hematology Recent Labs  Lab 01/19/19 0409 01/20/19 0221 01/21/19 0259  WBC 28.8* 24.7* 15.3*  RBC 2.47* 2.40* 2.78*  HGB 8.1* 7.7* 8.8*  HCT 23.5* 22.7* 25.7*  MCV 95.1 94.6 92.4  MCH 32.8 32.1 31.7  MCHC 34.5 33.9 34.2  RDW 17.2* 18.1* 17.5*  PLT 218 198 193    BNPNo results for input(s): BNP, PROBNP in the last 168 hours.   DDimer No results for input(s): DDIMER in the last 168 hours.   Radiology    CT ABDOMEN PELVIS WO CONTRAST  Result Date: 01/20/2019 CLINICAL DATA:  Follow-up retroperitoneal hematoma EXAM: CT ABDOMEN AND PELVIS WITHOUT CONTRAST TECHNIQUE: Multidetector CT imaging of the abdomen and pelvis was performed following the standard protocol without IV contrast. Oral enteric contrast was administered. COMPARISON:  01/17/2019 FINDINGS: Lower chest: Dependent bibasilar atelectasis or consolidation, similar to prior examination. Three-vessel coronary artery calcifications and/or stents. Hepatobiliary: No focal liver abnormality is seen. Status post cholecystectomy. No biliary dilatation. Pancreas: Unremarkable. No pancreatic ductal dilatation or surrounding inflammatory changes. Spleen: Normal in size without significant abnormality. Adrenals/Urinary Tract: Adrenal glands are unremarkable. Atrophic appearing kidneys. No hydronephrosis.  Bladder is unremarkable. Stomach/Bowel: Status post partial sleeve gastrectomy. Appendix appears normal. No evidence of bowel wall thickening, distention, or inflammatory changes. Sigmoid diverticulosis. Vascular/Lymphatic: A previously seen right femoral vascular catheter has been removed with overlying postoperative change. Aortic atherosclerosis. No enlarged abdominal or pelvic lymph nodes. Reproductive: No mass or other significant abnormality. Other: No  abdominal wall hernia or abnormality. Slight interval decrease size of a large, heterogeneous right retroperitoneal hematoma (series 3, image 56) tunneled Tenckhoff type peritoneal dialysis catheter, tip in the low abdomen. Trace ascites. Musculoskeletal: No acute or significant osseous findings. IMPRESSION: 1. Slight interval decrease in size of a large, heterogeneous right retroperitoneal hematoma. No evidence of new hemorrhage. 2. A previously seen right femoral vascular catheter has been removed with overlying postoperative change. 3. Tenckhoff type peritoneal dialysis catheter tip in the low abdomen. 4. Trace ascites. 5. Coronary artery disease. Aortic Atherosclerosis (ICD10-I70.0). Electronically Signed   By: Eddie Candle M.D.   On: 01/20/2019 16:17    Cardiac Studies   Diagnostic                                                           Intervention           Echo 01/16/19:  IMPRESSIONS    1. Left ventricular ejection fraction, by visual estimation, is 65 to 70%. The left ventricle has hyperdynamic function. There is moderately increased left ventricular hypertrophy.  2. Definity contrast agent was given IV to delineate the left ventricular endocardial borders.  3. Left ventricular diastolic parameters are consistent with Grade I diastolic dysfunction (impaired relaxation).  4. The left ventricle has no regional wall motion abnormalities.  5. Global right ventricle has normal systolic function.The right ventricular size is mildly  enlarged. No increase in right ventricular wall thickness.  6. Left atrial size was normal.  7. Right atrial size was normal.  8. The pericardial effusion is posterior to the left ventricle.  9. Trivial pericardial effusion is present. 10. The mitral valve was not well visualized. Trivial mitral valve regurgitation. 11. The tricuspid valve is not well visualized. 12. The aortic valve is tricuspid. Aortic valve regurgitation is not visualized. Mild aortic valve sclerosis without stenosis. 13. The pulmonic valve was grossly normal. Pulmonic valve regurgitation is not visualized. 14. The inferior vena cava is normal in size with <50% respiratory variability, suggesting right atrial pressure of 8 mmHg. 15. The interatrial septum was not well visualized.  Patient Profile     54 y.o. female with moderate CAD, chronic chest pain, diabetes, hypertension, hyperlipidemia, ESRD on PD, and morbid obesity admitted with NSTEMI and found to have progression of CAD.  She was offered CABG but elected staged PCI.  She underwent PCI of RCA and RI/OM1 with medical management of the LAD due to extensive calcification.  She developed RP hematoma/hemorrhage requiring surgery.  Post surgery she developed hypotension and metabolic/respiratory acidosis and hypoxic respiratory failure.  She required BiPAP, PD and levophed/vasopressin for BP support.  Assessment & Plan    # NSTEMI: # Hyperlipidemia:  Patient declined CABG and underwent PCI of RCA and RI/OM1.  LAD medically managed.  She remains on DAPT despite RP bleed.  BP remains soft.  Will add beta blocker as BP and bleeding allow.  # Shock: Occurred due to acute blood loss, ?sepsis.  LVEF 65-70%, so less likely cardiogenic.  Now stable off pressors.   # Acute hypoxic/hypercarbic respiratory failure: Resolved.  Now off BiPap and on room air.  Hemodynamically stable.   # ESRD on PD: Continue per Nephrology.  # DM: Per IM.  # Leukocytosis: Improving on  Zosyn.  For questions or updates, please contact [place] Please consult [email or web address] for contact info under        Signed, Skeet Latch, MD  01/21/2019, 12:17 PM

## 2019-01-21 NOTE — Progress Notes (Signed)
Pharmacy Antibiotic Note  Brenda Davis is a 54 y.o. female admitted on 01/15/2019 with NSTEMI, with concern for septic shock.  Pharmacy has been consulted for Zosyn dosing. Of note, the patient is on CCPD (effluent is clear).   Pt's WBC is elevated but decreasing 24.7 >> 8.8. PCT is elevated but decreasing 48.02 >> 40.06. Patient is afebirle and BP is improving (MAP 77). Cultures have demonstrated no growth to date.   Plan: Zosyn 2.25g IV Q8H. Monitor WBC, temp, RRT plans, and clinical status.  Height: 5' 5"  (165.1 cm) Weight: (!) 317 lb 3.9 oz (143.9 kg) IBW/kg (Calculated) : 57  Temp (24hrs), Avg:97.9 F (36.6 C), Min:97.6 F (36.4 C), Max:98.4 F (36.9 C)  Recent Labs  Lab 01/17/19 2209 01/18/19 0420 01/18/19 0847 01/19/19 0409 01/20/19 0221 01/21/19 0259  WBC 29.9* 35.9*  --  28.8* 24.7* 15.3*  CREATININE 17.28* 17.26* 17.28* 15.08* 14.19* 13.02*  LATICACIDVEN  --  4.6*  --   --   --   --     Estimated Creatinine Clearance: 7.2 mL/min (A) (by C-G formula based on SCr of 13.02 mg/dL (H)).    Allergies  Allergen Reactions  . Amlodipine Swelling  . Atorvastatin     Elevated LFT's  . Clonidine Derivatives Swelling    Limbs swell  . Doxycycline Nausea And Vomiting    "I threw up for 3 hours"  . Welchol [Colesevelam Hcl] Nausea Only     Thank you for allowing pharmacy to be a part of this patient's care.   Sherren Kerns, PharmD PGY1 Acute Care Pharmacy Resident 01/21/2019 11:00 AM

## 2019-01-22 LAB — GLUCOSE, CAPILLARY
Glucose-Capillary: 161 mg/dL — ABNORMAL HIGH (ref 70–99)
Glucose-Capillary: 160 mg/dL — ABNORMAL HIGH (ref 70–99)
Glucose-Capillary: 90 mg/dL (ref 70–99)
Glucose-Capillary: 84 mg/dL (ref 70–99)
Glucose-Capillary: 101 mg/dL — ABNORMAL HIGH (ref 70–99)

## 2019-01-22 LAB — CBC
HCT: 27 % — ABNORMAL LOW (ref 36.0–46.0)
Hemoglobin: 9 g/dL — ABNORMAL LOW (ref 12.0–15.0)
MCH: 31.8 pg (ref 26.0–34.0)
MCHC: 33.3 g/dL (ref 30.0–36.0)
MCV: 95.4 fL (ref 80.0–100.0)
Platelets: 237 10*3/uL (ref 150–400)
RBC: 2.83 MIL/uL — ABNORMAL LOW (ref 3.87–5.11)
RDW: 17.3 % — ABNORMAL HIGH (ref 11.5–15.5)
WBC: 15.6 10*3/uL — ABNORMAL HIGH (ref 4.0–10.5)
nRBC: 1.2 % — ABNORMAL HIGH (ref 0.0–0.2)

## 2019-01-22 MED ORDER — METOPROLOL TARTRATE 12.5 MG HALF TABLET
12.5000 mg | ORAL_TABLET | Freq: Two times a day (BID) | ORAL | Status: DC
  Administered 2019-01-22 – 2019-01-23 (×3): 12.5 mg via ORAL
  Filled 2019-01-22 (×3): qty 1

## 2019-01-22 MED ORDER — RISAQUAD PO CAPS
2.0000 | ORAL_CAPSULE | Freq: Every day | ORAL | Status: DC
  Administered 2019-01-22 – 2019-01-23 (×2): 2 via ORAL
  Filled 2019-01-22 (×2): qty 2

## 2019-01-22 MED ORDER — DELFLEX-LC/1.5% DEXTROSE 344 MOSM/L IP SOLN: INTRAPERITONEAL | Status: DC

## 2019-01-22 NOTE — Progress Notes (Signed)
Patient has labs pending to draw due to difficult lab draw by labs technicians. Notified provider on call for an order to draw from the IV.and an order to start a peripheral IV was received. IV team notified and Lattie Haw, RN was able to draw the labs from the RUA iv. We'll continue to monitor.

## 2019-01-22 NOTE — Evaluation (Signed)
Physical Therapy Evaluation Patient Details Name: Brenda Davis MRN: [id number] DOB: [birth year] Today's Date: 01/22/2019   History of Present Illness  54 y.o. female with moderate CAD, chronic chest pain, diabetes, hypertension, hyperlipidemia, ESRD on PD, and morbid obesity admitted with NSTEMI and found to have progression of CAD.  She was offered CABG but elected staged PCI.  She underwent PCI of RCA and RI/OM1 with medical management of the LAD due to extensive calcification.  She developed RP hematoma/hemorrhage requiring surgery.  Post surgery she developed hypotension and metabolic/respiratory acidosis and hypoxic respiratory failure.  Clinical Impression  Pt presents to PT with deficits in gait, balance, endurance, strength, and power. Pt affected by reports of dizziness during session, impairing her balance and resulting in physical assistance requirements to maintain balance during OOB activity. PT anticipates pt will demonstrate improved balance with use of RW for UE support and with continue OOB mobilization. Pt will benefit from continued acute PT POC to improve balance and activity tolerance.    Follow Up Recommendations Home health PT;Supervision/Assistance - 24 hour    Equipment Recommendations  None recommended by PT(pt already owns RW)    Recommendations for Other Services       Precautions / Restrictions Precautions Precautions: Fall Restrictions Weight Bearing Restrictions: No      Mobility  Bed Mobility Overal bed mobility: Needs Assistance Bed Mobility: Supine to Sit     Supine to sit: Supervision;HOB elevated        Transfers Overall transfer level: Needs assistance Equipment used: None Transfers: Sit to/from Stand Sit to Stand: Supervision            Ambulation/Gait Ambulation/Gait assistance: Min assist Gait Distance (Feet): 35 Feet Assistive device: None Gait Pattern/deviations: Step-to pattern;Drifts right/left;Staggering  left Gait velocity: reduced Gait velocity interpretation: <1.8 ft/sec, indicate of risk for recurrent falls General Gait Details: pt with shortened step to gait, increased sway with 2 LOB, pt requires PT assistance to correct LOB  Stairs            Wheelchair Mobility    Modified Rankin (Stroke Patients Only)       Balance Overall balance assessment: Needs assistance Sitting-balance support: No upper extremity supported;Feet supported Sitting balance-Leahy Scale: Good Sitting balance - Comments: supervision   Standing balance support: No upper extremity supported Standing balance-Leahy Scale: Poor Standing balance comment: minA for dynamic balance, no UE support                             Pertinent Vitals/Pain Pain Assessment: Faces Faces Pain Scale: Hurts a little bit Pain Location: stomach Pain Descriptors / Indicators: Aching Pain Intervention(s): Limited activity within patient's tolerance    Home Living Family/patient expects to be discharged to:: Private residence Living Arrangements: Spouse/significant other Available Help at Discharge: Family;Available PRN/intermittently Type of Home: House Home Access: Stairs to enter Entrance Stairs-Rails: None Entrance Stairs-Number of Steps: 2(2 single steps) Home Layout: One level Home Equipment: Walker - 2 wheels;Bedside commode      Prior Function Level of Independence: Independent         Comments: driving, working ([employer])      Hand Dominance   Dominant Hand: Right    Extremity/Trunk Assessment   Upper Extremity Assessment Upper Extremity Assessment: Generalized weakness    Lower Extremity Assessment Lower Extremity Assessment: Generalized weakness    Cervical / Trunk Assessment Cervical / Trunk Assessment: Other exceptions Cervical / Trunk Exceptions: morbid obesity  Communication   Communication: No difficulties  Cognition Arousal/Alertness: Awake/alert Behavior During  Therapy: WFL for tasks assessed/performed Overall Cognitive Status: Within Functional Limits for tasks assessed                                        General Comments General comments (skin integrity, edema, etc.): VSS on RA, pt reporting dizziness with mobility and demonstrating mild increase in work of breathing but saturating fine and BP stable    Exercises     Assessment/Plan    PT Assessment Patient needs continued PT services  PT Problem List Decreased strength;Decreased activity tolerance;Decreased balance;Decreased mobility;Decreased knowledge of use of DME;Decreased safety awareness;Cardiopulmonary status limiting activity       PT Treatment Interventions DME instruction;Gait training;Stair training;Functional mobility training;Therapeutic activities;Therapeutic exercise;Balance training;Neuromuscular re-education;Patient/family education    PT Goals (Current goals can be found in the Care Plan section)  Acute Rehab PT Goals Patient Stated Goal: To return to prior level of function and go home PT Goal Formulation: With patient Time For Goal Achievement: 02/05/19 Potential to Achieve Goals: Good    Frequency Min 3X/week   Barriers to discharge        Co-evaluation               AM-PAC PT "6 Clicks" Mobility  Outcome Measure Help needed turning from your back to your side while in a flat bed without using bedrails?: None Help needed moving from lying on your back to sitting on the side of a flat bed without using bedrails?: None Help needed moving to and from a bed to a chair (including a wheelchair)?: A Little Help needed standing up from a chair using your arms (e.g., wheelchair or bedside chair)?: A Little Help needed to walk in hospital room?: A Little Help needed climbing 3-5 steps with a railing? : A Lot 6 Click Score: 19    End of Session Equipment Utilized During Treatment: (none) Activity Tolerance: Patient tolerated treatment  well Patient left: in chair;with call bell/phone within reach;with family/visitor present Nurse Communication: Mobility status PT Visit Diagnosis: Unsteadiness on feet (R26.81)    Time: 8563-1497 PT Time Calculation (min) (ACUTE ONLY): 13 min   Charges:   PT Evaluation $PT Eval Moderate Complexity: 1 Mod          Zenaida Niece, PT, DPT Acute Rehabilitation Pager: [phone]   Zenaida Niece 01/22/2019, 2:50 PM

## 2019-01-22 NOTE — Progress Notes (Signed)
Labs drawn from iv R upper arm

## 2019-01-22 NOTE — Progress Notes (Signed)
PROGRESS NOTE  Brenda Davis WNU:[id number] DOB: 06-Mar-1965 DOA: 01/15/2019 PCP: [name], MD   LOS: 7 days   Brief narrative:  As per HPI, 54 year old female with past medical history of morbid obesity, ESRD on peritoneal dialysis, diabetes mellitus, cirrhosis of liver, coronary artery disease, was admitted to the hospital on 12/27 with NSTEMI.  Cardiac cath was complicated by retroperitoneal hemorrhage.  Left heart cath on 1228 showed three-vessel coronary artery disease.  On 12/29 opted to have a stent over CABG and drug-eluting stents x2 were placed in followed by retroperitoneal bleed.  Patient was then taken to the OR for repair on 12/29.  Postoperatively, patient had metabolic acidosis and shock requiring vasopressors.  BiPAP was started for hypercarbic respiratory failure.  Patient was then admitted to the ICU.  Patient was taken off vasopressors on 01/20/2019 and was considered for transfer out of the ICU.  Assessment/Plan:  Principal Problem:   NSTEMI (non-ST elevated myocardial infarction) ([place]) Active Problems:   Dyslipidemia, goal LDL below 70   Morbid obesity with BMI of 40.0-44.9, adult (HCC)   S/P laparoscopic sleeve gastrectomy   Acute respiratory failure with hypoxia and hypercapnia (HCC)   ESRD on peritoneal dialysis ([place])   DM (diabetes mellitus), type 2 with renal complications (HCC)   Hepatic cirrhosis (HCC)   Coronary artery disease involving native coronary artery of native heart with unstable angina pectoris (HCC)   Postprocedural hypotension   Hyperkalemia   Retroperitoneal hemorrhage - post Cath/PCI - taken to OR for drainage & CFA repair   Acute blood loss anemia - resolved, transfused in OR - Hgb stable   Increased anion gap metabolic acidosis   Shock circulatory (HCC) - secondary to acute blood loss anemia, elevated WBC (? sepsis vs post-op inflammation).h  Shock: Likely hemorrhagic. improved.  Patient did have any leukocytosis with no  clear source of infection.  Trending down leukocytosis from 28> 24>15.  Patient is currently on IV Zosyn.  Currently off vasopressors. PCT was high at 40. Check procal in am.  Will discontinue IV Zosyn at this time.  Acute hypercarbic respiratory failure> improved  Did require BIPAP during ICU, currently on room air.  Anion gap acidosis: likely renal failure +/- lactic acidosis in the setting of shock.   Nephrology on board. . Continue peritoneal dialysis.  Continue renal diet with fluid restriction.  Non-ST elevation MI.  CAD: Candidate for CABG, she has elected to try PCI first, which was done on 12/29.  Status post PCI to RCA and ramus intermedius.  cardiology, CVTS on board.  Continue Brilinta  Retroperitoneal hematoma: Complication of LHC 03/47. To OR by Dr Donzetta Matters for repair. s/p 2 units PRBC.  Continue Aranesp  Diabetes mellitus - continue SSI, Levemir, closely monitor blood glucose levels.  Last point-of-care glucose was 160  ESRD on PD. Nephrology on board.  Continue Sensipar and Aranesp  Hypokalemia, Hypomagnesemia. Mild .  P.o. KCl and magnesium oxide added.  Labs pending for today.  Mild diarrhea could be secondary to antibiotic.  Add probiotic.  Imodium as needed.  Discontinue IV Zosyn today  VTE Prophylaxis: Heparin subcu  Code Status: Full code  Family Communication: None.  Spoke with the patient in detail  Disposition Plan: Likely home with home health.  Will get PT, OT evaluation.  Consultants: Cardiology CVTS Nephrology  Procedures:  Central line  Arterial line  Cardiac cath    Antibiotics: Zosyn 12/30 >>1/3  Anti-infectives (From admission, onward)   Start  Dose/Rate Route Frequency Ordered Stop   01/18/19 0400  piperacillin-tazobactam (ZOSYN) IVPB 2.25 g     2.25 g 100 mL/hr over 30 Minutes Intravenous Every 8 hours 01/18/19 0333       Subjective: Today, patient complains of generalized fatigue and weakness.  Denies any shortness of  breath, cough.  Had some loose bowel movement.  Denies any nausea vomiting or abdominal pain.    Objective: Vitals:   01/22/19 0402 01/22/19 0800  BP: 117/73 (!) 97/58  Pulse: 77 96  Resp: 12 20  Temp: 97.9 F (36.6 C) 98.4 F (36.9 C)  SpO2: 100% 97%    Intake/Output Summary (Last 24 hours) at 01/22/2019 1127 Last data filed at 01/22/2019 0940 Gross per 24 hour  Intake 13789 ml  Output 14579 ml  Net -790 ml   Filed Weights   01/20/19 0600 01/21/19 0500 01/22/19 0402  Weight: (!) 139.4 kg (!) 143.9 kg (!) 144.9 kg   Body mass index is 53.16 kg/m.   Physical Exam: GENERAL: Patient is alert awake communicative.  Morbidly obese. HENT: No scleral pallor or icterus. Pupils equally reactive to light. Oral mucosa is moist NECK: is supple, no palpable thyroid enlargement. CHEST:  Diminished breath sounds bilaterally. CVS: S1 and S2 heard, no murmur. Regular rate and rhythm. No pericardial rub. ABDOMEN: Soft, non-tender, bowel sounds are present.  Peritoneal dialysis catheter in place. EXTREMITIES: Bilateral lower extremity trace edema.  Left upper extremity AV fistula in place. CNS: Cranial nerves are intact. No focal motor or sensory deficits. SKIN: warm and dry without rashes.  Data Review: I have personally reviewed the following laboratory data and studies,  CBC: Recent Labs  Lab 01/17/19 2209 01/18/19 0420 01/18/19 0837 01/18/19 2358 01/19/19 0409 01/20/19 0221 01/21/19 0259  WBC 29.9* 35.9*  --   --  28.8* 24.7* 15.3*  HGB 12.0 10.9* 10.5* 8.5* 8.1* 7.7* 8.8*  HCT 36.1 32.6* 31.0* 25.0* 23.5* 22.7* 25.7*  MCV 100.3* 97.3  --   --  95.1 94.6 92.4  PLT 315 302  --   --  218 198 841   Basic Metabolic Panel: Recent Labs  Lab 01/18/19 0420 01/18/19 0847 01/18/19 1700 01/18/19 2358 01/19/19 0409 01/20/19 0221 01/21/19 0259  NA 136 136  --  133* 135 134* 136  K 6.0* 6.9* 5.3* 4.4 4.3 3.6 3.3*  CL 96* 96*  --   --  94* 91* 94*  CO2 15* 18*  --   --  22 25 24     GLUCOSE 330* 380*  --   --  259* 218* 167*  BUN 78* 77*  --   --  69* 67* 61*  CREATININE 17.26* 17.28*  --   --  15.08* 14.19* 13.02*  CALCIUM 7.4* 7.1*  --   --  6.9* 6.6* 6.5*  MG  --   --   --   --   --  1.7 1.6*  PHOS 11.5*  --   --   --  8.9* 8.1* 7.8*   Liver Function Tests: Recent Labs  Lab 01/15/19 1448 01/18/19 0420 01/19/19 0409 01/20/19 0221 01/21/19 0259  AST 15  --  94*  --   --   ALT 17  --  69*  --   --   ALKPHOS 98  --  56  --   --   BILITOT 0.3  --  0.5  --   --   PROT 7.1  --  5.5*  --   --  ALBUMIN 3.5 2.5* 2.5*  2.5* 2.5* 2.6*   No results for input(s): LIPASE, AMYLASE in the last 168 hours. No results for input(s): AMMONIA in the last 168 hours. Cardiac Enzymes: No results for input(s): CKTOTAL, CKMB, CKMBINDEX, TROPONINI in the last 168 hours. BNP (last 3 results) Recent Labs    02/24/18 1027 06/18/18 2314  BNP 36.5 110.2*    ProBNP (last 3 results) No results for input(s): PROBNP in the last 8760 hours.  CBG: Recent Labs  Lab 01/21/19 1611 01/21/19 1812 01/21/19 2152 01/22/19 0628 01/22/19 0808  GLUCAP 94 96 87 161* 160*   Recent Results (from the past 240 hour(s))  SARS CORONAVIRUS 2 (TAT 6-24 HRS) Nasopharyngeal Nasopharyngeal Swab     Status: None   Collection Time: 01/15/19  9:38 PM   Specimen: Nasopharyngeal Swab  Result Value Ref Range Status   SARS Coronavirus 2 NEGATIVE NEGATIVE Final    Comment: (NOTE) SARS-CoV-2 target nucleic acids are NOT DETECTED. The SARS-CoV-2 RNA is generally detectable in upper and lower respiratory specimens during the acute phase of infection. Negative results do not preclude SARS-CoV-2 infection, do not rule out co-infections with other pathogens, and should not be used as the sole basis for treatment or other patient management decisions. Negative results must be combined with clinical observations, patient history, and epidemiological information. The expected result is Negative. Fact  Sheet for Patients: SugarRoll.be Fact Sheet for Healthcare Providers: https://www.woods-mathews.com/ This test is not yet approved or cleared by the [place] FDA and  has been authorized for detection and/or diagnosis of SARS-CoV-2 by FDA under an Emergency Use Authorization (EUA). This EUA will remain  in effect (meaning this test can be used) for the duration of the COVID-19 declaration under Section 56 4(b)(1) of the Act, 21 U.S.C. section 360bbb-3(b)(1), unless the authorization is terminated or revoked sooner. Performed at [facility], [place] [address], [place], [place] [address]   MRSA PCR Screening     Status: None   Collection Time: 01/17/19 11:24 PM   Specimen: Nasal Mucosa; Nasopharyngeal  Result Value Ref Range Status   MRSA by PCR NEGATIVE NEGATIVE Final    Comment:        The GeneXpert MRSA Assay (FDA approved for NASAL specimens only), is one component of a comprehensive MRSA colonization surveillance program. It is not intended to diagnose MRSA infection nor to guide or monitor treatment for MRSA infections. Performed at [facility], [place] [address] [place], [place] [address]   Culture, blood (routine x 2)     Status: None (Preliminary result)   Collection Time: 01/18/19  6:20 AM   Specimen: BLOOD RIGHT HAND  Result Value Ref Range Status   Specimen Description BLOOD RIGHT HAND  Final   Special Requests   Final    BOTTLES DRAWN AEROBIC ONLY Blood Culture results may not be optimal due to an inadequate volume of blood received in culture bottles   Culture   Final    NO GROWTH 4 DAYS Performed at [facility], [place] [address] [place], [place] [address]    Report Status PENDING  Incomplete  Culture, blood (routine x 2)     Status: None (Preliminary result)   Collection Time: 01/18/19  6:28 AM   Specimen: BLOOD RIGHT HAND  Result Value Ref Range Status   Specimen Description BLOOD RIGHT HAND   Final   Special Requests   Final    BOTTLES DRAWN AEROBIC ONLY Blood Culture results  may not be optimal due to an inadequate volume of blood received in culture bottles   Culture   Final    NO GROWTH 4 DAYS Performed at [facility], [place] [address], [place], [place] [address]    Report Status PENDING  Incomplete     Studies: CT ABDOMEN PELVIS WO CONTRAST  Result Date: 01/20/2019 CLINICAL DATA:  Follow-up retroperitoneal hematoma EXAM: CT ABDOMEN AND PELVIS WITHOUT CONTRAST TECHNIQUE: Multidetector CT imaging of the abdomen and pelvis was performed following the standard protocol without IV contrast. Oral enteric contrast was administered. COMPARISON:  01/17/2019 FINDINGS: Lower chest: Dependent bibasilar atelectasis or consolidation, similar to prior examination. Three-vessel coronary artery calcifications and/or stents. Hepatobiliary: No focal liver abnormality is seen. Status post cholecystectomy. No biliary dilatation. Pancreas: Unremarkable. No pancreatic ductal dilatation or surrounding inflammatory changes. Spleen: Normal in size without significant abnormality. Adrenals/Urinary Tract: Adrenal glands are unremarkable. Atrophic appearing kidneys. No hydronephrosis. Bladder is unremarkable. Stomach/Bowel: Status post partial sleeve gastrectomy. Appendix appears normal. No evidence of bowel wall thickening, distention, or inflammatory changes. Sigmoid diverticulosis. Vascular/Lymphatic: A previously seen right femoral vascular catheter has been removed with overlying postoperative change. Aortic atherosclerosis. No enlarged abdominal or pelvic lymph nodes. Reproductive: No mass or other significant abnormality. Other: No abdominal wall hernia or abnormality. Slight interval decrease size of a large, heterogeneous right retroperitoneal hematoma (series 3, image 56) tunneled Tenckhoff type peritoneal dialysis catheter, tip in the low abdomen. Trace ascites. Musculoskeletal: No acute or significant  osseous findings. IMPRESSION: 1. Slight interval decrease in size of a large, heterogeneous right retroperitoneal hematoma. No evidence of new hemorrhage. 2. A previously seen right femoral vascular catheter has been removed with overlying postoperative change. 3. Tenckhoff type peritoneal dialysis catheter tip in the low abdomen. 4. Trace ascites. 5. Coronary artery disease. Aortic Atherosclerosis (ICD10-I70.0). Electronically Signed   By: Eddie Candle M.D.   On: 01/20/2019 16:17    Scheduled Meds: . aspirin EC  81 mg Oral Daily  . calcium acetate  1,334 mg Oral TID WC  . Chlorhexidine Gluconate Cloth  6 each Topical Daily  . cinacalcet  90 mg Oral Q supper  . darbepoetin (ARANESP) injection - NON-DIALYSIS  60 mcg Subcutaneous Q Fri-1800  . ferric citrate  210 mg Oral TID WC  . folic acid  1 mg Oral Daily  . gabapentin  100 mg Oral BID  . gentamicin ointment   Topical Daily  . heparin  5,000 Units Subcutaneous Q8H  . insulin aspart  0-5 Units Subcutaneous QHS  . insulin aspart  0-9 Units Subcutaneous TID WC  . insulin detemir  10 Units Subcutaneous BID  . magnesium oxide  400 mg Oral BID  . mouth rinse  15 mL Mouth Rinse BID  . nitroGLYCERIN  0.5 inch Topical Q6H  . potassium chloride  40 mEq Oral Daily  . rosuvastatin  10 mg Oral Daily  . sodium chloride flush  10-40 mL Intracatheter Q12H  . sodium chloride flush  3 mL Intravenous Once  . sodium chloride flush  3 mL Intravenous Q12H  . ticagrelor  90 mg Oral BID    Continuous Infusions: . sodium chloride Stopped (01/19/19 2014)  . dialysis solution 1.5% low-MG/low-CA    . dialysis solution 2.5% low-MG/low-CA    . piperacillin-tazobactam (ZOSYN)  IV 2.25 g (01/22/19 0411)  . sodium chloride 250 mL (01/18/19 1836)     Flora Lipps, MD  [facility] 01/22/2019

## 2019-01-22 NOTE — Progress Notes (Addendum)
[facility] Progress Note   Subjective: Up at [facility], still connected to PD. No chest pain. SR on monitor.   Objective Vitals:   01/21/19 1959 01/22/19 0402 01/22/19 0800 01/22/19 1135  BP: 122/79 117/73 (!) 97/58 108/61  Pulse: 75 77 96 90  Resp: 20 12 20 16   Temp: (!) 97.5 F (36.4 C) 97.9 F (36.6 C) 98.4 F (36.9 C) 98.2 F (36.8 C)  TempSrc: Oral Oral Oral Oral  SpO2: 100% 100% 97% 97%  Weight:  (!) 144.9 kg    Height:       Physical Exam General: Obese female in NAD Heart: S1,S2 RRR SR on monitor Lungs: CTAB Abdomen: S, NT Extremities: No LE edema Dialysis Access: L AVF + bruit PD cath intact with lines connected   Additional Objective Labs: Basic Metabolic Panel: Recent Labs  Lab 01/19/19 0409 01/20/19 0221 01/21/19 0259  NA 135 134* 136  K 4.3 3.6 3.3*  CL 94* 91* 94*  CO2 22 25 24   GLUCOSE 259* 218* 167*  BUN 69* 67* 61*  CREATININE 15.08* 14.19* 13.02*  CALCIUM 6.9* 6.6* 6.5*  PHOS 8.9* 8.1* 7.8*   Liver Function Tests: Recent Labs  Lab 01/15/19 1448 01/19/19 0409 01/20/19 0221 01/21/19 0259  AST 15 94*  --   --   ALT 17 69*  --   --   ALKPHOS 98 56  --   --   BILITOT 0.3 0.5  --   --   PROT 7.1 5.5*  --   --   ALBUMIN 3.5 2.5*  2.5* 2.5* 2.6*   No results for input(s): LIPASE, AMYLASE in the last 168 hours. CBC: Recent Labs  Lab 01/17/19 2209 01/18/19 0420 01/19/19 0409 01/20/19 0221 01/21/19 0259  WBC 29.9* 35.9* 28.8* 24.7* 15.3*  HGB 12.0 10.9* 8.1* 7.7* 8.8*  HCT 36.1 32.6* 23.5* 22.7* 25.7*  MCV 100.3* 97.3 95.1 94.6 92.4  PLT 315 302 218 198 193   Blood Culture    Component Value Date/Time   SDES BLOOD RIGHT HAND 01/18/2019 0628   SPECREQUEST  01/18/2019 3267    BOTTLES DRAWN AEROBIC ONLY Blood Culture results may not be optimal due to an inadequate volume of blood received in culture bottles   CULT  01/18/2019 0628    NO GROWTH 4 DAYS Performed at [facility], [place] [address], [place],  [place] [address]    REPTSTATUS PENDING 01/18/2019 0998    Cardiac Enzymes: No results for input(s): CKTOTAL, CKMB, CKMBINDEX, TROPONINI in the last 168 hours. CBG: Recent Labs  Lab 01/21/19 1812 01/21/19 2152 01/22/19 0628 01/22/19 0808 01/22/19 1131  GLUCAP 96 87 161* 160* 90   Iron Studies: No results for input(s): IRON, TIBC, TRANSFERRIN, FERRITIN in the last 72 hours. @lablastinr3 @ Studies/Results: CT ABDOMEN PELVIS WO CONTRAST  Result Date: 01/20/2019 CLINICAL DATA:  Follow-up retroperitoneal hematoma EXAM: CT ABDOMEN AND PELVIS WITHOUT CONTRAST TECHNIQUE: Multidetector CT imaging of the abdomen and pelvis was performed following the standard protocol without IV contrast. Oral enteric contrast was administered. COMPARISON:  01/17/2019 FINDINGS: Lower chest: Dependent bibasilar atelectasis or consolidation, similar to prior examination. Three-vessel coronary artery calcifications and/or stents. Hepatobiliary: No focal liver abnormality is seen. Status post cholecystectomy. No biliary dilatation. Pancreas: Unremarkable. No pancreatic ductal dilatation or surrounding inflammatory changes. Spleen: Normal in size without significant abnormality. Adrenals/Urinary Tract: Adrenal glands are unremarkable. Atrophic appearing kidneys. No hydronephrosis. Bladder is unremarkable. Stomach/Bowel: Status post partial sleeve gastrectomy. Appendix appears normal. No evidence of  bowel wall thickening, distention, or inflammatory changes. Sigmoid diverticulosis. Vascular/Lymphatic: A previously seen right femoral vascular catheter has been removed with overlying postoperative change. Aortic atherosclerosis. No enlarged abdominal or pelvic lymph nodes. Reproductive: No mass or other significant abnormality. Other: No abdominal wall hernia or abnormality. Slight interval decrease size of a large, heterogeneous right retroperitoneal hematoma (series 3, image 56) tunneled Tenckhoff type peritoneal dialysis catheter, tip  in the low abdomen. Trace ascites. Musculoskeletal: No acute or significant osseous findings. IMPRESSION: 1. Slight interval decrease in size of a large, heterogeneous right retroperitoneal hematoma. No evidence of new hemorrhage. 2. A previously seen right femoral vascular catheter has been removed with overlying postoperative change. 3. Tenckhoff type peritoneal dialysis catheter tip in the low abdomen. 4. Trace ascites. 5. Coronary artery disease. Aortic Atherosclerosis (ICD10-I70.0). Electronically Signed   By: Eddie Candle M.D.   On: 01/20/2019 16:17   Medications: . sodium chloride Stopped (01/19/19 2014)  . dialysis solution 1.5% low-MG/low-CA    . dialysis solution 2.5% low-MG/low-CA    . sodium chloride 250 mL (01/18/19 1836)   . acidophilus  2 capsule Oral Daily  . aspirin EC  81 mg Oral Daily  . calcium acetate  1,334 mg Oral TID WC  . Chlorhexidine Gluconate Cloth  6 each Topical Daily  . cinacalcet  90 mg Oral Q supper  . darbepoetin (ARANESP) injection - NON-DIALYSIS  60 mcg Subcutaneous Q Fri-1800  . ferric citrate  210 mg Oral TID WC  . folic acid  1 mg Oral Daily  . gabapentin  100 mg Oral BID  . gentamicin ointment   Topical Daily  . heparin  5,000 Units Subcutaneous Q8H  . insulin aspart  0-5 Units Subcutaneous QHS  . insulin aspart  0-9 Units Subcutaneous TID WC  . insulin detemir  10 Units Subcutaneous BID  . magnesium oxide  400 mg Oral BID  . mouth rinse  15 mL Mouth Rinse BID  . metoprolol tartrate  12.5 mg Oral BID  . nitroGLYCERIN  0.5 inch Topical Q6H  . potassium chloride  40 mEq Oral Daily  . rosuvastatin  10 mg Oral Daily  . sodium chloride flush  10-40 mL Intracatheter Q12H  . sodium chloride flush  3 mL Intravenous Once  . sodium chloride flush  3 mL Intravenous Q12H  . ticagrelor  90 mg Oral BID     Dialysis Orders: CCPD 6 exchanges 2.7L 1.5h dwell No day dwell.(Patient says she has been doing 5 exchanges) - no esa or IV Fe, Hb 11-12  baseline  Problem/Plan 1. NSTEMI.Known multivessel CAD.[place] 12/28 with progressive severe 3V disease. SP PCI to RCA and ramus intermedius 12/29 2. RP bleed -complication of PCI procedure, sp repair Dr Donzetta Matters 12/29 3. Anemia abl/ ckd -HGB 7.7. Rec'd Aranesp 60 mcg  SQ 01/19/2018. Now S/P 4 units PRBCs since adm.  4. ESRD -continue CCPD, effluent remains clear and yellow which she say is different. PD tonight.  5. Hypertension/volume - usesAll 1.5% fluids at home. Wt's up is climbing but volume appears fine. Needs standing wt, same scale .Again  Mix 1.5 and 2.5% tonight. 6. Metabolic bone disease -Ca ok. Continue home binders.Follow labs here.  7. Nutrition -Renal diet/vitamins  Rita H. Brown NP-C 01/22/2019, 1:14 PM  [facility] [phone]  Pt seen, examined and agree w A/P as above.  Kelly Splinter  MD 01/22/2019, 2:30 PM

## 2019-01-22 NOTE — Progress Notes (Signed)
Progress Note  Patient Name: Brenda Davis Date of Encounter: 01/22/2019  Primary Cardiologist: Skeet Latch, MD   Subjective   Feeling well.  No chest pain or shortness of breath. Continues to have diarrhea.  Eager to walk today with PT.  Inpatient Medications    Scheduled Meds: . acidophilus  2 capsule Oral Daily  . aspirin EC  81 mg Oral Daily  . calcium acetate  1,334 mg Oral TID WC  . Chlorhexidine Gluconate Cloth  6 each Topical Daily  . cinacalcet  90 mg Oral Q supper  . darbepoetin (ARANESP) injection - NON-DIALYSIS  60 mcg Subcutaneous Q Fri-1800  . ferric citrate  210 mg Oral TID WC  . folic acid  1 mg Oral Daily  . gabapentin  100 mg Oral BID  . gentamicin ointment   Topical Daily  . heparin  5,000 Units Subcutaneous Q8H  . insulin aspart  0-5 Units Subcutaneous QHS  . insulin aspart  0-9 Units Subcutaneous TID WC  . insulin detemir  10 Units Subcutaneous BID  . magnesium oxide  400 mg Oral BID  . mouth rinse  15 mL Mouth Rinse BID  . nitroGLYCERIN  0.5 inch Topical Q6H  . potassium chloride  40 mEq Oral Daily  . rosuvastatin  10 mg Oral Daily  . sodium chloride flush  10-40 mL Intracatheter Q12H  . sodium chloride flush  3 mL Intravenous Once  . sodium chloride flush  3 mL Intravenous Q12H  . ticagrelor  90 mg Oral BID   Continuous Infusions: . sodium chloride Stopped (01/19/19 2014)  . dialysis solution 1.5% low-MG/low-CA    . dialysis solution 2.5% low-MG/low-CA    . sodium chloride 250 mL (01/18/19 1836)   PRN Meds: sodium chloride, acetaminophen, acetaminophen, fentaNYL (SUBLIMAZE) injection, heparin, dianeal solution for CAPD/CCPD with heparin, loperamide, nitroGLYCERIN, ondansetron (ZOFRAN) IV, ondansetron, oxyCODONE-acetaminophen, sodium chloride, sodium chloride flush, sodium chloride flush   Vital Signs    Vitals:   01/21/19 1959 01/22/19 0402 01/22/19 0800 01/22/19 1135  BP: 122/79 117/73 (!) 97/58 108/61  Pulse: 75 77 96 90    Resp: 20 12 20 16   Temp: (!) 97.5 F (36.4 C) 97.9 F (36.6 C) 98.4 F (36.9 C) 98.2 F (36.8 C)  TempSrc: Oral Oral Oral Oral  SpO2: 100% 100% 97% 97%  Weight:  (!) 144.9 kg    Height:        Intake/Output Summary (Last 24 hours) at 01/22/2019 1233 Last data filed at 01/22/2019 0940 Gross per 24 hour  Intake 270 ml  Output --  Net 270 ml   Last 3 Weights 01/22/2019 01/21/2019 01/20/2019  Weight (lbs) 319 lb 7.1 oz 317 lb 3.9 oz 307 lb 5.1 oz  Weight (kg) 144.9 kg 143.9 kg 139.4 kg      Telemetry    Sinus rhythm.  No events  - Personally Reviewed  ECG    N/a - Personally Reviewed  Physical Exam   VS:  BP 108/61 (BP Location: Right Arm)   Pulse 90   Temp 98.2 F (36.8 C) (Oral)   Resp 16   Ht 5' 5"  (1.651 m)   Wt (!) 144.9 kg   SpO2 97%   BMI 53.16 kg/m  , BMI Body mass index is 53.16 kg/m. GENERAL:  Well appearing. No acute distress. HEENT: Pupils equal round and reactive, fundi not visualized, oral mucosa unremarkable NECK:  No jugular venous distention, waveform within normal limits, carotid upstroke brisk and symmetric,  no bruits LUNGS:  Clear to auscultation bilaterally HEART:  RRR.  PMI not displaced or sustained,S1 and S2 within normal limits, no S3, no S4, no clicks, no rubs, no murmurs ABD:  Flat, positive bowel sounds normal in frequency in pitch, no bruits, no rebound, no guarding, no midline pulsatile mass, no hepatomegaly, no splenomegaly EXT:  1 plus pulses throughout, no edema, no cyanosis no clubbing SKIN:  No rashes no nodules NEURO:  Cranial nerves II through XII grossly intact, motor grossly intact throughout [facility]:  Cognitively intact, oriented to person place and time   Labs    High Sensitivity Troponin:   Recent Labs  Lab 01/15/19 1448 01/15/19 1806  TROPONINIHS 43* 781*      Chemistry Recent Labs  Lab 01/15/19 1448 01/16/19 0449 01/19/19 0409 01/20/19 0221 01/21/19 0259  NA 136  --  135 134* 136  K 4.4  --  4.3 3.6 3.3*  CL  95*  --  94* 91* 94*  CO2 19*  --  22 25 24   GLUCOSE 246*  --  259* 218* 167*  BUN 69*  --  69* 67* 61*  CREATININE 15.95*  --  15.08* 14.19* 13.02*  CALCIUM 8.2*  --  6.9* 6.6* 6.5*  PROT 7.1  --  5.5*  --   --   ALBUMIN 3.5   < > 2.5*  2.5* 2.5* 2.6*  AST 15  --  94*  --   --   ALT 17  --  69*  --   --   ALKPHOS 98  --  56  --   --   BILITOT 0.3  --  0.5  --   --   GFRNONAA 2*  --  2* 3* 3*  GFRAA 3*  --  3* 3* 3*  ANIONGAP 22*  --  19* 18* 18*   < > = values in this interval not displayed.     Hematology Recent Labs  Lab 01/19/19 0409 01/20/19 0221 01/21/19 0259  WBC 28.8* 24.7* 15.3*  RBC 2.47* 2.40* 2.78*  HGB 8.1* 7.7* 8.8*  HCT 23.5* 22.7* 25.7*  MCV 95.1 94.6 92.4  MCH 32.8 32.1 31.7  MCHC 34.5 33.9 34.2  RDW 17.2* 18.1* 17.5*  PLT 218 198 193    BNPNo results for input(s): BNP, PROBNP in the last 168 hours.   DDimer No results for input(s): DDIMER in the last 168 hours.   Radiology    CT ABDOMEN PELVIS WO CONTRAST  Result Date: 01/20/2019 CLINICAL DATA:  Follow-up retroperitoneal hematoma EXAM: CT ABDOMEN AND PELVIS WITHOUT CONTRAST TECHNIQUE: Multidetector CT imaging of the abdomen and pelvis was performed following the standard protocol without IV contrast. Oral enteric contrast was administered. COMPARISON:  01/17/2019 FINDINGS: Lower chest: Dependent bibasilar atelectasis or consolidation, similar to prior examination. Three-vessel coronary artery calcifications and/or stents. Hepatobiliary: No focal liver abnormality is seen. Status post cholecystectomy. No biliary dilatation. Pancreas: Unremarkable. No pancreatic ductal dilatation or surrounding inflammatory changes. Spleen: Normal in size without significant abnormality. Adrenals/Urinary Tract: Adrenal glands are unremarkable. Atrophic appearing kidneys. No hydronephrosis. Bladder is unremarkable. Stomach/Bowel: Status post partial sleeve gastrectomy. Appendix appears normal. No evidence of bowel wall  thickening, distention, or inflammatory changes. Sigmoid diverticulosis. Vascular/Lymphatic: A previously seen right femoral vascular catheter has been removed with overlying postoperative change. Aortic atherosclerosis. No enlarged abdominal or pelvic lymph nodes. Reproductive: No mass or other significant abnormality. Other: No abdominal wall hernia or abnormality. Slight interval decrease size of a large, heterogeneous  right retroperitoneal hematoma (series 3, image 56) tunneled Tenckhoff type peritoneal dialysis catheter, tip in the low abdomen. Trace ascites. Musculoskeletal: No acute or significant osseous findings. IMPRESSION: 1. Slight interval decrease in size of a large, heterogeneous right retroperitoneal hematoma. No evidence of new hemorrhage. 2. A previously seen right femoral vascular catheter has been removed with overlying postoperative change. 3. Tenckhoff type peritoneal dialysis catheter tip in the low abdomen. 4. Trace ascites. 5. Coronary artery disease. Aortic Atherosclerosis (ICD10-I70.0). Electronically Signed   By: Eddie Candle M.D.   On: 01/20/2019 16:17    Cardiac Studies   Diagnostic                                                           Intervention           Echo 01/16/19:  IMPRESSIONS    1. Left ventricular ejection fraction, by visual estimation, is 65 to 70%. The left ventricle has hyperdynamic function. There is moderately increased left ventricular hypertrophy.  2. Definity contrast agent was given IV to delineate the left ventricular endocardial borders.  3. Left ventricular diastolic parameters are consistent with Grade I diastolic dysfunction (impaired relaxation).  4. The left ventricle has no regional wall motion abnormalities.  5. Global right ventricle has normal systolic function.The right ventricular size is mildly enlarged. No increase in right ventricular wall thickness.  6. Left atrial size was normal.  7. Right atrial size was normal.  8. The  pericardial effusion is posterior to the left ventricle.  9. Trivial pericardial effusion is present. 10. The mitral valve was not well visualized. Trivial mitral valve regurgitation. 11. The tricuspid valve is not well visualized. 12. The aortic valve is tricuspid. Aortic valve regurgitation is not visualized. Mild aortic valve sclerosis without stenosis. 13. The pulmonic valve was grossly normal. Pulmonic valve regurgitation is not visualized. 14. The inferior vena cava is normal in size with <50% respiratory variability, suggesting right atrial pressure of 8 mmHg. 15. The interatrial septum was not well visualized.  Patient Profile     54 y.o. female with moderate CAD, chronic chest pain, diabetes, hypertension, hyperlipidemia, ESRD on PD, and morbid obesity admitted with NSTEMI and found to have progression of CAD.  She was offered CABG but elected staged PCI.  She underwent PCI of RCA and RI/OM1 with medical management of the LAD due to extensive calcification.  She developed RP hematoma/hemorrhage requiring surgery.  Post surgery she developed hypotension and metabolic/respiratory acidosis and hypoxic respiratory failure.  She required BiPAP, PD and levophed/vasopressin for BP support.  Assessment & Plan    # NSTEMI: # Hyperlipidemia:  Patient declined CABG and underwent PCI of RCA and RI/OM1.  LAD medically managed.  She remains on DAPT.  H/h stable after RP bleed.  BP continues to run low. Will add metoprolol 12.5 mg bid with HR/BP parameters.  # Shock: Resovled.  Occurred due to acute blood loss.  LVEF 65-70%, so less likely cardiogenic.  Now stable off pressors.   # Acute hypoxic/hypercarbic respiratory failure: Resolved.  Now off BiPap and on room air.  Hemodynamically stable.   # ESRD on PD: Continue per Nephrology.  # DM: Per IM.  # Leukocytosis: Improving.  Zosyn discontinued today.  Infectious source not identified.  # Diarrhea: ?2/2 antibiotics.  Will  give  Immodium.     For questions or updates, please contact [place] Please consult [email or web address] for contact info under        Signed, Skeet Latch, MD  01/22/2019, 12:33 PM

## 2019-01-23 ENCOUNTER — Telehealth: Payer: Self-pay | Admitting: Cardiovascular Disease

## 2019-01-23 LAB — RENAL FUNCTION PANEL
Albumin: 2.5 g/dL — ABNORMAL LOW (ref 3.5–5.0)
Anion gap: 16 — ABNORMAL HIGH (ref 5–15)
BUN: 57 mg/dL — ABNORMAL HIGH (ref 6–20)
CO2: 22 mmol/L (ref 22–32)
Calcium: 6.8 mg/dL — ABNORMAL LOW (ref 8.9–10.3)
Chloride: 90 mmol/L — ABNORMAL LOW (ref 98–111)
Creatinine, Ser: 13.06 mg/dL — ABNORMAL HIGH (ref 0.44–1.00)
GFR calc Af Amer: 3 mL/min — ABNORMAL LOW (ref >60)
GFR calc non Af Amer: 3 mL/min — ABNORMAL LOW (ref >60)
Glucose, Bld: 118 mg/dL — ABNORMAL HIGH (ref 70–99)
Phosphorus: 7.5 mg/dL — ABNORMAL HIGH (ref 2.5–4.6)
Potassium: 3.2 mmol/L — ABNORMAL LOW (ref 3.5–5.1)
Sodium: 128 mmol/L — ABNORMAL LOW (ref 135–145)

## 2019-01-23 LAB — CULTURE, BLOOD (ROUTINE X 2)
Culture: NO GROWTH
Culture: NO GROWTH

## 2019-01-23 LAB — MAGNESIUM: Magnesium: 1.9 mg/dL (ref 1.7–2.4)

## 2019-01-23 LAB — GLUCOSE, CAPILLARY
Glucose-Capillary: 151 mg/dL — ABNORMAL HIGH (ref 70–99)
Glucose-Capillary: 114 mg/dL — ABNORMAL HIGH (ref 70–99)

## 2019-01-23 LAB — LACTIC ACID, PLASMA: Lactic Acid, Venous: 1.1 mmol/L (ref 0.5–1.9)

## 2019-01-23 LAB — PROCALCITONIN: Procalcitonin: 22.29 ng/mL

## 2019-01-23 MED ORDER — FERRIC CITRATE 1 GM 210 MG(FE) PO TABS: 420.0000 mg | ORAL_TABLET | Freq: Three times a day (TID) | ORAL | 0 refills | Status: DC

## 2019-01-23 MED ORDER — METOPROLOL TARTRATE 25 MG PO TABS: 12.5000 mg | ORAL_TABLET | Freq: Two times a day (BID) | ORAL | 1 refills | Status: DC

## 2019-01-23 MED ORDER — TICAGRELOR 90 MG PO TABS: 90.0000 mg | ORAL_TABLET | Freq: Two times a day (BID) | ORAL | 3 refills | Status: DC

## 2019-01-23 MED ORDER — POTASSIUM CHLORIDE CRYS ER 20 MEQ PO TBCR
40.0000 meq | EXTENDED_RELEASE_TABLET | Freq: Once | ORAL | Status: AC
  Administered 2019-01-23: 08:00:00 40 meq via ORAL
  Filled 2019-01-23: qty 2

## 2019-01-23 MED ORDER — FERRIC CITRATE 1 GM 210 MG(FE) PO TABS
420.0000 mg | ORAL_TABLET | Freq: Three times a day (TID) | ORAL | Status: DC
  Filled 2019-01-23 (×2): qty 2

## 2019-01-23 MED ORDER — POTASSIUM CHLORIDE CRYS ER 20 MEQ PO TBCR: 20.0000 meq | EXTENDED_RELEASE_TABLET | Freq: Once | ORAL | Status: DC

## 2019-01-23 MED ORDER — METFORMIN HCL 500 MG PO TABS: 500.0000 mg | ORAL_TABLET | Freq: Two times a day (BID) | ORAL | 5 refills | Status: DC

## 2019-01-23 MED FILL — METOPROLOL TARTRATE 25 MG T: 25 | 30 days supply | Qty: 30 | Fill #0

## 2019-01-23 MED FILL — metFORMIN HCL 500 MG TABS: 500 | 30 days supply | Qty: 60 | Fill #0

## 2019-01-23 MED FILL — BRILINTA 90 MG TABLET: 90 | 30 days supply | Qty: 60 | Fill #0

## 2019-01-23 NOTE — Telephone Encounter (Signed)
New Message     Pt has a TOC appt 02/03/19 at 830am with Kerin Ransom

## 2019-01-23 NOTE — Progress Notes (Signed)
Physical Therapy Treatment Patient Details Name: Brenda Davis MRN: [id number] DOB: [birth year] Today's Date: 01/23/2019    History of Present Illness 54 y.o. female with moderate CAD, chronic chest pain, diabetes, hypertension, hyperlipidemia, ESRD on PD, and morbid obesity admitted with NSTEMI and found to have progression of CAD.  She was offered CABG but elected staged PCI.  She underwent PCI of RCA and RI/OM1 with medical management of the LAD due to extensive calcification.  She developed RP hematoma/hemorrhage requiring surgery.  Post surgery she developed hypotension and metabolic/respiratory acidosis and hypoxic respiratory failure.    PT Comments    Pt able to increase gait to 60' with RW and stable vitals but did report fatigue and shortness of breath requiring rest break before completing walk.  Pt with 24 hr support and RW at home.  Cont POC   Follow Up Recommendations  Home health PT;Supervision/Assistance - 24 hour     Equipment Recommendations  None recommended by PT    Recommendations for Other Services       Precautions / Restrictions Precautions Precautions: Fall    Mobility  Bed Mobility Overal bed mobility: Needs Assistance Bed Mobility: Supine to Sit     Supine to sit: Supervision;HOB elevated        Transfers Overall transfer level: Needs assistance Equipment used: None Transfers: Sit to/from Stand Sit to Stand: Supervision            Ambulation/Gait Ambulation/Gait assistance: Min guard Gait Distance (Feet): 60 Feet Assistive device: Rolling walker (2 wheeled) Gait Pattern/deviations: Decreased stride length;Wide base of support Gait velocity: reduced   General Gait Details: fatigued easily; ambulated 60' then 20' more after seated rest break; 3/4 on dyspnea scale; cues for RW   Stairs             Wheelchair Mobility    Modified Rankin (Stroke Patients Only)       Balance Overall balance assessment: Needs  assistance Sitting-balance support: No upper extremity supported;Feet supported Sitting balance-Leahy Scale: Normal     Standing balance support: No upper extremity supported Standing balance-Leahy Scale: Fair                              Cognition Arousal/Alertness: Awake/alert Behavior During Therapy: WFL for tasks assessed/performed Overall Cognitive Status: Within Functional Limits for tasks assessed                                        Exercises      General Comments        Pertinent Vitals/Pain Pain Assessment: No/denies pain   On RA with O2 sats 98-100% HR 90-100 bpm BP as follows: Sit 104/63 Post walk 107/94 Stand 123/74 Stand 3 mins: 115/59    Home Living                      Prior Function            PT Goals (current goals can now be found in the care plan section) Progress towards PT goals: Progressing toward goals    Frequency    Min 3X/week      PT Plan Current plan remains appropriate    Co-evaluation              AM-PAC PT "6 Clicks" Mobility   Outcome Measure  Help needed turning from your back to your side while in a flat bed without using bedrails?: None Help needed moving from lying on your back to sitting on the side of a flat bed without using bedrails?: None Help needed moving to and from a bed to a chair (including a wheelchair)?: None Help needed standing up from a chair using your arms (e.g., wheelchair or bedside chair)?: None Help needed to walk in hospital room?: None Help needed climbing 3-5 steps with a railing? : A Little 6 Click Score: 23    End of Session Equipment Utilized During Treatment: Gait belt Activity Tolerance: Patient tolerated treatment well Patient left: in chair;with call bell/phone within reach;with family/visitor present Nurse Communication: Mobility status PT Visit Diagnosis: Unsteadiness on feet (R26.81)     Time: 3794-3276 PT Time Calculation  (min) (ACUTE ONLY): 19 min  Charges:  $Gait Training: 8-22 mins                     Maggie Font, PT Acute Rehab Services Pager [phone] [facility] [phone] [facility] [place] 01/23/2019, 2:17 PM

## 2019-01-23 NOTE — TOC Transition Note (Signed)
Transition of Care [facility]) - CM/SW Discharge Note Marvetta Gibbons RN, BSN Transitions of Care Unit 4E- RN Case Manager [phone]   Patient Details  Name: Brenda Davis MRN: [id number] Date of Birth: 06-08-1965  Transition of Care [facility]) CM/SW Contact:  Dawayne Patricia, RN Phone Number: 01/23/2019, 2:19 PM   Clinical Narrative:    Pt stable for transition home, pt has been started on Brilinta- per insurance check- copay cost $70- TOC pharmacy to fill first month with 30 day free card and deliver to bedside- pt concerned about cost- instructed to speak to MD on f/u appointment for alternate medications. Order for HHPT placed- CM provided pt list for choice Per CMS guidelines from medicare.gov website with star ratings (copy placed in shadow chart)- per pt she does not have a preference- CM will reach out to see what agencies based on highest star ratings can provide staffing for needed services. Pt agreeable. Call made to [facility] for referral - spoke with Butch Penny- referral has been accepted. Pt reports she has a walker at home.    Final next level of care: [place] Barriers to Discharge: No Barriers Identified   Patient Goals and CMS Choice Patient states their goals for this hospitalization and ongoing recovery are:: return home and walk CMS Medicare.gov Compare Post Acute Care list provided to:: Patient Choice offered to / list presented to : Patient  Discharge Placement                 Home with [facility]      Discharge Plan and Services   Discharge Planning Services: CM Consult Post Acute Care Choice: Home Health          DME Arranged: N/A DME Agency: NA       HH Arranged: PT [place] Agency: [place] ([facility]) Date HH Agency Contacted: 01/23/19 Time [place]: 60 Representative spoke with at [place]: [place] ([place]) Interventions     Readmission Risk Interventions Readmission Risk  Prevention Plan 01/23/2019 01/20/2019  Transportation Screening - Complete  Medication Review [employer]) - Complete  PCP or Specialist appointment within 3-5 days of discharge - Not Complete  PCP/Specialist Appt Not Complete comments - Not medically stable for discharge  [facility] [facility] or [place] - Complete  SW Recovery Care/Counseling Consult - Complete  [place] Not Applicable Complete  Some recent data might be hidden

## 2019-01-23 NOTE — Telephone Encounter (Signed)
TOC - discharged today 01/23/2019 First call attempt 01/24/2019

## 2019-01-23 NOTE — Progress Notes (Signed)
2992-4268 MI education completed with pt and husband who voiced understanding. Stressed importance of brilinta with stent. Reviewed NTG use, MI restrictions, walking with walker for ex, and CRP 2. Referred diet information to dietitian at [facility] since pt is diabetic and has renal disease. Referred to [place] CRP 2. Pt knows that she will need to complete Home PT prior to attending CRP. Pt is interested in participating in Virtual Cardiac and Pulmonary Rehab. Pt advised that Virtual Cardiac and Pulmonary Rehab is provided at no cost to the patient.  Checklist:  1. Pt has smart device  ie smartphone and/or ipad for downloading an app  Yes 2. Reliable internet/wifi service    Yes 3. Understands how to use their smartphone and navigate within an app.  Yes   Pt verbalized understanding and is in agreement.

## 2019-01-23 NOTE — Progress Notes (Signed)
Discharged to home with family office visits in place teaching done  

## 2019-01-23 NOTE — Progress Notes (Addendum)
Subjective:  Tolerating ccpd , feels better overall thinks may go home soon   Objective Vital signs in last 24 hours: Vitals:   01/22/19 0800 01/22/19 1135 01/22/19 1315 01/22/19 1945  BP: (!) 97/58 108/61 (!) 109/51 127/78  Pulse: 96 90 75 80  Resp: 20 16 16 16   Temp: 98.4 F (36.9 C) 98.2 F (36.8 C) 98.6 F (37 C) 98.1 F (36.7 C)  TempSrc: Oral Oral Oral Oral  SpO2: 97% 97% 98% 100%  Weight:   (!) 141.1 kg   Height:       Weight change: -3.8 kg  Physical Exam: General: alert Obese female in NAD Heart:  RRR, no m, r,g   Lungs: CTA  Abdomen: BS +, soft , obese, NT Extremities: No LE edema Dialysis Access: L AVF + bruit PD cath intact    OP Dialysis Orders: CCPD 6 exchanges 2.7L 1.5h dwell No day dwell.(Patient says she has been doing 5 exchanges) - no esa or IV Fe, Hb 11-12 baseline  Problem/Plan:   1. NSTEMI.Known multivessel CAD.[place] 12/28 with progressive severe 3V disease. SP PCI to RCA and ramus intermedius 12/29 2. RP bleed -complication ofPCI procedure, sp repair Dr Donzetta Matters 12/29 3. Anemia abl/ ckd -HGB 9.0 on  01/22/19 . Rec'd Aranesp 60 mcg  SQ 01/19/2018. Now S/P 4 units PRBCs since adm.  4. ESRD - k 3.2< 3.3 < 3.6 give one dose po k replacement  continue CCPD/no current prob  Despite RP bleed   5. Hypertension/volume -usesAll 1.5% fluids at home. Wt's up is climbing but volume appears fine. Needs standing wt, same scale .AgainMix 1.5 and 2.5% tonight. 6. Metabolic bone disease -Ca ok. Continue home binders.Follow labs here. Phos 7.5 incr binder 7. Nutrition -Renal diet/vitamins  Ernest Haber, PA-C [facility] [phone] 01/23/2019,11:17 AM  LOS: 8 days   Labs: Basic Metabolic Panel: Recent Labs  Lab 01/20/19 0221 01/21/19 0259 01/22/19 2309  NA 134* 136 128*  K 3.6 3.3* 3.2*  CL 91* 94* 90*  CO2 25 24 22   GLUCOSE 218* 167* 118*  BUN 67* 61* 57*  CREATININE 14.19* 13.02* 13.06*  CALCIUM 6.6* 6.5* 6.8*   PHOS 8.1* 7.8* 7.5*   Liver Function Tests: Recent Labs  Lab 01/19/19 0409 01/20/19 0221 01/21/19 0259 01/22/19 2309  AST 94*  --   --   --   ALT 69*  --   --   --   ALKPHOS 56  --   --   --   BILITOT 0.5  --   --   --   PROT 5.5*  --   --   --   ALBUMIN 2.5*  2.5* 2.5* 2.6* 2.5*   No results for input(s): LIPASE, AMYLASE in the last 168 hours. No results for input(s): AMMONIA in the last 168 hours. CBC: Recent Labs  Lab 01/18/19 0420 01/19/19 0409 01/20/19 0221 01/21/19 0259 01/22/19 2320  WBC 35.9* 28.8* 24.7* 15.3* 15.6*  HGB 10.9* 8.1* 7.7* 8.8* 9.0*  HCT 32.6* 23.5* 22.7* 25.7* 27.0*  MCV 97.3 95.1 94.6 92.4 95.4  PLT 302 218 198 193 237   Cardiac Enzymes: No results for input(s): CKTOTAL, CKMB, CKMBINDEX, TROPONINI in the last 168 hours. CBG: Recent Labs  Lab 01/22/19 0808 01/22/19 1131 01/22/19 1604 01/22/19 2156 01/23/19 0617  GLUCAP 160* 90 84 101* 151*    Studies/Results: No results found. Medications: . sodium chloride Stopped (01/19/19 2014)  . dialysis solution 1.5% low-MG/low-CA    . dialysis  solution 1.5% low-MG/low-CA    . dialysis solution 2.5% low-MG/low-CA    . sodium chloride 250 mL (01/18/19 1836)   . acidophilus  2 capsule Oral Daily  . aspirin EC  81 mg Oral Daily  . calcium acetate  1,334 mg Oral TID WC  . Chlorhexidine Gluconate Cloth  6 each Topical Daily  . cinacalcet  90 mg Oral Q supper  . darbepoetin (ARANESP) injection - NON-DIALYSIS  60 mcg Subcutaneous Q Fri-1800  . ferric citrate  210 mg Oral TID WC  . folic acid  1 mg Oral Daily  . gabapentin  100 mg Oral BID  . gentamicin ointment   Topical Daily  . heparin  5,000 Units Subcutaneous Q8H  . insulin aspart  0-5 Units Subcutaneous QHS  . insulin aspart  0-9 Units Subcutaneous TID WC  . insulin detemir  10 Units Subcutaneous BID  . mouth rinse  15 mL Mouth Rinse BID  . metoprolol tartrate  12.5 mg Oral BID  . nitroGLYCERIN  0.5 inch Topical Q6H  . rosuvastatin   10 mg Oral Daily  . sodium chloride flush  10-40 mL Intracatheter Q12H  . sodium chloride flush  3 mL Intravenous Once  . sodium chloride flush  3 mL Intravenous Q12H  . ticagrelor  90 mg Oral BID    Nephrology attending: Patient was seen and examined at bedside.  Chart reviewed.  I agree with the assessment and plan as outlined above.  ESRD on PD admitted with an NSTEMI status post stent placement.  Now feels better.  Doing well on PD.  Treated with potassium for hypokalemia.  Noted patient is being discharged home today.  Recommend to resume prior PD prescription.  Received ESA. Discussed with the primary team.  Lawson Radar, MD [facility].

## 2019-01-23 NOTE — Care Management Important Message (Signed)
Important Message  Patient Details  Name: Brenda Davis MRN: [id number] Date of Birth: 1966/01/04   Medicare Important Message Given:  Yes     Shelda Altes 01/23/2019, 2:26 PM

## 2019-01-23 NOTE — TOC Benefit Eligibility Note (Signed)
Transition of Care [facility]) Benefit Eligibility Note    Patient Details  Name: Brenda Davis MRN: [id number] Date of Birth: 1965-10-24   Medication/Dose: Brilinta/Ticagrelor 45m twice daily  Covered?: Yes     Prescription Coverage Preferred [facility]acy: [facility]  Spoke with Person/Company/Phone Number:: CLarey Seat/[facility] [employer]/ [phone] Co-Pay: 929day supply retail and Mail Order 70.00     Deductible: (Lana Fish       IOrbie PyoPhone Number: 01/23/2019, 11:56 AM

## 2019-01-23 NOTE — Progress Notes (Signed)
Lab  called and said they cannot process the pro calcitonin and the lactic acid orderd because they do not see the orders at their end. The previous orders discontinued and new orders put in, we'll continue to monitor.

## 2019-01-23 NOTE — Discharge Summary (Signed)
Discharge Summary    Patient ID: Brenda Davis MRN: [id number]; DOB: [birth year]  Admit date: 01/15/2019 Discharge date: 01/23/2019  Primary Care Provider: [name], MD  Primary Cardiologist: Brenda Latch, MD  Primary Electrophysiologist:  None   Discharge Diagnoses    Principal Problem:   NSTEMI (non-ST elevated myocardial infarction) [facility]) Active Problems:   Dyslipidemia, goal LDL below 70   Morbid obesity with BMI of 40.0-44.9, adult ([place])   S/P laparoscopic sleeve gastrectomy   Acute respiratory failure with hypoxia and hypercapnia ([place])   ESRD on peritoneal dialysis ([place])   DM (diabetes mellitus), type 2 with renal complications (HCC)   Hepatic cirrhosis ([place])   Coronary artery disease involving native coronary artery of native heart with unstable angina pectoris (HCC)   Postprocedural hypotension   Hyperkalemia   Retroperitoneal hemorrhage - post Cath/PCI - taken to OR for drainage & CFA repair   Acute blood loss anemia - resolved, transfused in OR - Hgb stable   Increased anion gap metabolic acidosis   Shock circulatory (HCC) - secondary to acute blood loss anemia, elevated WBC (? sepsis vs post-op inflammation).h    Diagnostic Studies/Procedures    CORONARY STENT INTERVENTION  01/17/2020  Conclusion   Mid LAD lesion is 75% stenosed. The LAD was not addressed and will be managed medically.  Ost LAD to Mid LAD lesion is 40% stenosed.  Mid Cx to Dist Cx lesion is 55% stenosed with 40% stenosed side branch in Ost 3rd Mrg.  Prox RCA lesion is 75% stenosed.  A drug-eluting stent was successfully placed using a SYNERGY XD 3.0X38, postdilated to 3.5 mm.  Post intervention, there is a 0% residual stenosis.  Ost 1st Mrg lesion is 99% stenosed. EBU4 Guide catheter used.  A drug-eluting stent was successfully placed using a SYNERGY XD 2.25X24, postdilated to 2.75 mm.  Post intervention, there is a 0% residual stenosis.   Continue DAPT for  12 months.  Would recommend clopidogrel long-term after one year since she has such diffuse disease.  Continue aggressive secondary prevention.   Diagnostic                                                           Intervention            LEFT HEART CATH AND CORONARY ANGIOGRAPHY 01/16/2019  Conclusion   Mid LAD lesion is 75% stenosed. The LAD is diffusely diseased and heavily calcified.  Mid Cx to Dist Cx lesion is 55% stenosed with 40% stenosed side branch in Ost 3rd Mrg.  Prox RCA lesion is 75% stenosed. This disease appears to have progressed.  Ost 1st Mrg-1 lesion is 50% stenosed.  Ost 1st Mrg-2 lesion is 99% stenosed. This lesion is significantly worse than prior.  The left ventricular systolic function is normal.  LV end diastolic pressure is normal.  The left ventricular ejection fraction is 55-65% by visual estimate.  There is no aortic valve stenosis.  Ost LAD to Mid LAD lesion is 40% stenosed.   Severe multivessel disease with progression of calcific disease in the mid LAD, proximal RCA and tortuous mid OM1.    Plan for surgical consult.     Echocardiogram 01/16/2019 IMPRESSIONS  1. Left ventricular ejection fraction, by visual estimation, is 65 to 70%. The left ventricle has  hyperdynamic function. There is moderately increased left ventricular hypertrophy.  2. Definity contrast agent was given IV to delineate the left ventricular endocardial borders.  3. Left ventricular diastolic parameters are consistent with Grade I diastolic dysfunction (impaired relaxation).  4. The left ventricle has no regional wall motion abnormalities.  5. Global right ventricle has normal systolic function.The right ventricular size is mildly enlarged. No increase in right ventricular wall thickness.  6. Left atrial size was normal.  7. Right atrial size was normal.  8. The pericardial effusion is posterior to the left ventricle.  9. Trivial pericardial effusion is present. 10. The  mitral valve was not well visualized. Trivial mitral valve regurgitation. 11. The tricuspid valve is not well visualized. 12. The aortic valve is tricuspid. Aortic valve regurgitation is not visualized. Mild aortic valve sclerosis without stenosis. 13. The pulmonic valve was grossly normal. Pulmonic valve regurgitation is not visualized. 14. The inferior vena cava is normal in size with <50% respiratory variability, suggesting right atrial pressure of 8 mmHg. 15. The interatrial septum was not well visualized. ____________   History of Present Illness     Brenda Davis is a 54 y.o. female with medical history significant of CAD, DM2, HTN, HLD, Gout, ESRD on PD who presented to the hospital on 01/15/2019 for evaluation of chest pain.  Patient reported that for the prior 2 weeks she had been having issues with fatigue.  Thurs and Friday she also had decreased exercise tolerance and chest pain that resolved with nitroglycerin.  She seemed to get better on Saturday, however, her symptoms returned on Sunday.  She noted that she finished her PD at about 930am.  She felt more fatigued than usual and was not hungry at that time.  She continued to not feel herself and then at 11am she developed substernal and left sided chest pain which felt like a stabbing pain.  Associated nausea, SOB and diaphoresis.  She took NTG X 1 and the pain worsened.  She took NTG a second time and the pain worsened to 10/10, no radiation.  Her husband called 65 and she was administered baby aspirin X 4 and a 3rd dose of NTG and the pain eased off.  It also improved with fentanyl in the ED.  She continues to have pain at about 3/10 and "nagging" in nature.     Her presentation was similar to her presentation in June.  At which time LHC was on 06/20/2018 that showed moderate diffuse coronary artery disease with no clear culprit for PCI.Marland Kitchen  She was not able to tolerated the isosorbide, she felt that it made her chest pain worse  and she also developed diaphoresis with it.    Hospital Course     Consultants: CT surgery, Dr. Kipp Davis Nephrology  High-sensitivity troponin was initially 43 and rose to 781 and she was diagnosed with NSTEMI.  Echocardiogram done on 01/16/2019 showed dynamic LV systolic function with EF 65-70% with moderately increased LVH, no regional wall motion abnormalities.  She was taken for left heart cath on 01/16/2019 which showed severe multivessel disease with progression of calcific disease in the mid LAD, proximal LAD and tortuous mid OM1.  She was seen in consultation by cardiothoracic surgery, Dr. Kipp Davis.  She was felt to have good targets for surgical bypass however the patient preferred to consider PCI/stent placement.  On 01/17/2019 the patient was taken back to the Cath Lab for PCI.  It was felt that PCI of the RCA with possible  PCI of the ramus lesion would be the most beneficial to treat.  Her LAD lesion would be treated medically.  Dr. Ellyn Hack felt that for revascularization of the LAD, if warranted in the future, would be best treated with Davis graft.  She did undergo PCI to the RCA and ramus intermedius.    Post-cath she became hypotensive, but not tachycardic.  CT of the abdomen showed large retroperitoneal hemorrhage, vascular surgery was consulted and Dr. Donzetta Matters took the patient to the OR for surgical repair and drainage of the hematoma. Postop was complicated by metabolic acidosis with hyperkalemia and acute blood loss anemia with circulatory shock.  She was transfused in the OR and 2 more units postoperatively.  She briefly required vasopressors and BiPAP.  She also had elevated WBCs with questionable sepsis versus postop inflammation.  She was given antibiotics which have now been stopped.  Infectious source was not identified.  The patient is being treated medically with aspirin and Brilinta.  There are no signs of active bleeding. Hemoglobin is now stable at 9.0.  Blood pressure  continues to run low.  Metoprolol 12.5 mg twice daily was added with heart rate/BP parameters.  She continued on peritoneal dialysis and was followed by nephrology.  The patient was seen by physical therapy with recommendation for continued home physical therapy to improve gait, balance, endurance, strength and power.  She remains on renal dose statin. She was on atorvastatin in the past which was discontinued due to transaminitis.  She is now on low dose rosuvastatin. Could consider outpatient lipid clinic referral for consideration of PCSK9 inhibitor therapy.  She is planned for follow-up with vascular surgery, Dr. Donzetta Matters or PA, for staple removal and 3-4 weeks.  The patient was not on prior treatment for diabetes. A1c 7.2 on 01/17/2019. Reportedly her blood sugars had improved in the past after lap band surgery.  She has been getting sliding scale insulin while in the hospital.  She will be started on metformin 500 mg twice daily at discharge.  She will need outpatient follow-up with primary care.  Patient has been seen by Dr. Oval Linsey today and deemed ready for discharge home. All follow up appointments have been scheduled. Discharge medications are listed below.   Did the patient have an acute coronary syndrome (MI, NSTEMI, STEMI, etc) this admission?:  Yes                               AHA/ACC Clinical Performance & Quality Measures: 1. Aspirin prescribed? - Yes 2. ADP Receptor Inhibitor (Plavix/Clopidogrel, Brilinta/Ticagrelor or Effient/Prasugrel) prescribed (includes medically managed patients)? - Yes 3. Beta Blocker prescribed? - Yes 4. High Intensity Statin (Lipitor 40-71m or Crestor 20-441m prescribed? - Yes 5. EF assessed during THIS hospitalization? - Yes 6. For EF <40%, was ACEI/ARB prescribed? - Not Applicable (EF >/= 4028%7. For EF <40%, Aldosterone Antagonist (Spironolactone or Eplerenone) prescribed? - Not Applicable (EF >/= 4031%8. Cardiac Rehab Phase II ordered (Included  Medically managed Patients)? - Yes   _____________  Discharge Vitals Blood pressure 127/78, pulse 80, temperature 98.1 F (36.7 C), temperature source Oral, resp. rate 16, height 5' 5"  (1.651 m), weight (!) 141.1 kg, SpO2 100 %.  Filed Weights   01/21/19 0500 01/22/19 0402 01/22/19 1315  Weight: (!) 143.9 kg (!) 144.9 kg (!) 141.1 kg    Labs & Radiologic Studies    CBC Recent Labs    01/21/19 0259 01/22/19 2320  WBC 15.3* 15.6*  HGB 8.8* 9.0*  HCT 25.7* 27.0*  MCV 92.4 95.4  PLT 193 614   Basic Metabolic Panel Recent Labs    01/21/19 0259 01/22/19 2309 01/22/19 2320  NA 136 128*  --   K 3.3* 3.2*  --   CL 94* 90*  --   CO2 24 22  --   GLUCOSE 167* 118*  --   BUN 61* 57*  --   CREATININE 13.02* 13.06*  --   CALCIUM 6.5* 6.8*  --   MG 1.6*  --  1.9  PHOS 7.8* 7.5*  --    Liver Function Tests Recent Labs    01/21/19 0259 01/22/19 2309  ALBUMIN 2.6* 2.5*   No results for input(s): LIPASE, AMYLASE in the last 72 hours. High Sensitivity Troponin:   Recent Labs  Lab 01/15/19 1448 01/15/19 1806  TROPONINIHS 43* 781*    BNP Invalid input(s): POCBNP D-Dimer No results for input(s): DDIMER in the last 72 hours. Hemoglobin A1C No results for input(s): HGBA1C in the last 72 hours. Fasting Lipid Panel No results for input(s): CHOL, HDL, LDLCALC, TRIG, CHOLHDL, LDLDIRECT in the last 72 hours. Thyroid Function Tests No results for input(s): TSH, T4TOTAL, T3FREE, THYROIDAB in the last 72 hours.  Invalid input(s): FREET3 _____________  CT ABDOMEN PELVIS WO CONTRAST  Result Date: 01/20/2019 CLINICAL DATA:  Follow-up retroperitoneal hematoma EXAM: CT ABDOMEN AND PELVIS WITHOUT CONTRAST TECHNIQUE: Multidetector CT imaging of the abdomen and pelvis was performed following the standard protocol without IV contrast. Oral enteric contrast was administered. COMPARISON:  01/17/2019 FINDINGS: Lower chest: Dependent bibasilar atelectasis or consolidation, similar to prior  examination. Three-vessel coronary artery calcifications and/or stents. Hepatobiliary: No focal liver abnormality is seen. Status post cholecystectomy. No biliary dilatation. Pancreas: Unremarkable. No pancreatic ductal dilatation or surrounding inflammatory changes. Spleen: Normal in size without significant abnormality. Adrenals/Urinary Tract: Adrenal glands are unremarkable. Atrophic appearing kidneys. No hydronephrosis. Bladder is unremarkable. Stomach/Bowel: Status post partial sleeve gastrectomy. Appendix appears normal. No evidence of bowel wall thickening, distention, or inflammatory changes. Sigmoid diverticulosis. Vascular/Lymphatic: A previously seen right femoral vascular catheter has been removed with overlying postoperative change. Aortic atherosclerosis. No enlarged abdominal or pelvic lymph nodes. Reproductive: No mass or other significant abnormality. Other: No abdominal wall hernia or abnormality. Slight interval decrease size of a large, heterogeneous right retroperitoneal hematoma (series 3, image 56) tunneled Tenckhoff type peritoneal dialysis catheter, tip in the low abdomen. Trace ascites. Musculoskeletal: No acute or significant osseous findings. IMPRESSION: 1. Slight interval decrease in size of a large, heterogeneous right retroperitoneal hematoma. No evidence of new hemorrhage. 2. A previously seen right femoral vascular catheter has been removed with overlying postoperative change. 3. Tenckhoff type peritoneal dialysis catheter tip in the low abdomen. 4. Trace ascites. 5. Coronary artery disease. Aortic Atherosclerosis (ICD10-I70.0). Electronically Signed   By: Eddie Candle M.D.   On: 01/20/2019 16:17   CT ABDOMEN PELVIS WO CONTRAST  Result Date: 01/17/2019 CLINICAL DATA:  Abdominal and groin pain status post cardiac catheterization yesterday. EXAM: CT ABDOMEN AND PELVIS WITHOUT CONTRAST TECHNIQUE: Multidetector CT imaging of the abdomen and pelvis was performed following the  standard protocol without IV contrast. COMPARISON:  CT abdomen pelvis dated April 18, 2018. FINDINGS: Lower chest: No acute abnormality. Subsegmental atelectasis in both lower lobes and lingula. 4 mm ground-glass nodule in the right middle lobe. Hepatobiliary: Subtle nodular contour of the left hepatic lobe again noted. No focal liver abnormality. Status post cholecystectomy. No biliary dilatation. Pancreas:  Unremarkable. No pancreatic ductal dilatation or surrounding inflammatory changes. Spleen: Normal in size without focal abnormality. Adrenals/Urinary Tract: The adrenal glands are unremarkable. Unchanged bilateral renal atrophy with trace residual contrast in both collecting systems. No hydronephrosis. The bladder is decompressed and contains excreted contrast. Stomach/Bowel: Unchanged small hiatal hernia. Postsurgical changes related to prior gastric sleeve resection again noted. No bowel wall thickening, distention, or surrounding inflammatory changes. Mild left-sided colonic diverticulosis. Normal appendix. Vascular/Lymphatic: Aortoiliac and branch vessel atherosclerosis. No enlarged abdominal or pelvic lymph nodes. Reproductive: Uterus and bilateral adnexa are unremarkable. Other: Right groin arterial sheath with the tip adjacent to but not clearly within the junction of the right external iliac and common femoral arteries. Large right-sided retroperitoneal hematoma measuring approximately 10.0 x 15.4 x 24.9 cm (AP by transverse by CC). Tiny foci of pneumoperitoneum overlying the liver likely related to peritoneal dialysis catheter. Musculoskeletal: No acute or significant osseous findings. IMPRESSION: 1. Large right-sided retroperitoneal hematoma measuring approximately 10.0 x 15.4 x 24.9 cm. 2. Right groin arterial sheath with the tip adjacent to but not clearly within the junction of the right external iliac and common femoral arteries. 3. Subtle nodular contour of the left hepatic lobe again noted,  suspicious for cirrhosis. 4. Tiny foci of pneumoperitoneum again noted, likely related to peritoneal dialysis. 5.  Aortic atherosclerosis (ICD10-I70.0). Electronically Signed   By: Titus Dubin M.D.   On: 01/17/2019 18:10   DG Chest 2 View  Result Date: 01/15/2019 CLINICAL DATA:  Chest pain since 1 p.m., history end-stage renal disease on dialysis, hypertension, type II diabetes mellitus, coronary artery disease EXAM: CHEST - 2 VIEW COMPARISON:  06/18/2018 FINDINGS: Normal heart size, mediastinal contours, and pulmonary vascularity. Minimal subsegmental atelectasis at lingula and RIGHT base. Lungs otherwise clear. No infiltrate, pleural effusion or pneumothorax. Bones demineralized. IMPRESSION: Minimal bibasilar atelectasis. Electronically Signed   By: Lavonia Dana M.D.   On: 01/15/2019 15:35   CARDIAC CATHETERIZATION  Addendum Date: 01/17/2019    Mid LAD lesion is 75% stenosed. The LAD was not addressed and will be managed medically.  Ost LAD to Mid LAD lesion is 40% stenosed.  Mid Cx to Dist Cx lesion is 55% stenosed with 40% stenosed side branch in Ost 3rd Mrg.  Prox RCA lesion is 75% stenosed.  A drug-eluting stent was successfully placed using a SYNERGY XD 3.0X38, postdilated to 3.5 mm.  Post intervention, there is a 0% residual stenosis.  Ost 1st Mrg lesion is 99% stenosed. EBU4 Guide catheter used.  A drug-eluting stent was successfully placed using a SYNERGY XD 2.25X24, postdilated to 2.75 mm.  Post intervention, there is a 0% residual stenosis.  Continue DAPT for 12 months.  Would recommend clopidogrel long-term after one year since she has such diffuse disease.  Continue aggressive secondary prevention.   Result Date: 01/17/2019  Mid LAD lesion is 75% stenosed. The LAD was not addressed and will be managed medically.  Ost LAD to Mid LAD lesion is 40% stenosed.  Mid Cx to Dist Cx lesion is 55% stenosed with 40% stenosed side branch in Ost 3rd Mrg.  Prox RCA lesion is 75%  stenosed.  A drug-eluting stent was successfully placed using a SYNERGY XD 3.0X38, postdilated to 3.5 mm.  Post intervention, there is a 0% residual stenosis.  Ost 1st Mrg lesion is 99% stenosed. EBU4 Guide catheter used.  A drug-eluting stent was successfully placed using a SYNERGY XD 2.25X24, postdilated to 2.75 mm.  Post intervention, there is a 0% residual stenosis.  Continue DAPT for 12 months.  Would recommend clopidogrel long-term after one year since she has such diffuse disease.  Continue aggressive secondary prevention.   CARDIAC CATHETERIZATION  Result Date: 01/16/2019  Mid LAD lesion is 75% stenosed. The LAD is diffusely diseased and heavily calcified.  Mid Cx to Dist Cx lesion is 55% stenosed with 40% stenosed side branch in Ost 3rd Mrg.  Prox RCA lesion is 75% stenosed. This disease appears to have progressed.  Ost 1st Mrg-1 lesion is 50% stenosed.  Ost 1st Mrg-2 lesion is 99% stenosed. This lesion is significantly worse than prior.  The left ventricular systolic function is normal.  LV end diastolic pressure is normal.  The left ventricular ejection fraction is 55-65% by visual estimate.  There is no aortic valve stenosis.  Ost LAD to Mid LAD lesion is 40% stenosed.  Severe multivessel disease with progression of calcific disease in the mid LAD, proximal RCA and tortuous mid OM1.  Plan for surgical consult.    DG CHEST PORT 1 VIEW  Result Date: 01/17/2019 CLINICAL DATA:  Central line placement. EXAM: PORTABLE CHEST 1 VIEW COMPARISON:  Chest x-ray dated January 15, 2019. FINDINGS: New right internal jugular central venous catheter with tip at the cavoatrial junction. The heart size and mediastinal contours are within normal limits. Normal pulmonary vascularity. Minimal bibasilar atelectasis. No focal consolidation, pleural effusion, or pneumothorax. No acute osseous abnormality. IMPRESSION: New right internal jugular central venous catheter without complicating feature.  Electronically Signed   By: Titus Dubin M.D.   On: 01/17/2019 21:32   ECHOCARDIOGRAM COMPLETE  Result Date: 01/16/2019   ECHOCARDIOGRAM REPORT   Patient Name:   Brenda Davis Date of Exam: 01/16/2019 Medical Rec #:  [id number]                 Height:       65.0 in Accession #:    [id number]                Weight:       289.5 lb Date of Birth:  June 25, 1965                BSA:          2.31 m Patient Age:    26 years                  BP:           116/73 mmHg Patient Gender: F                         HR:           69 bpm. Exam Location:  Inpatient Procedure: 2D Echo, Color Doppler, Cardiac Doppler and Intracardiac            Opacification Agent Indications:    Acute MI  History:        Patient has prior history of Echocardiogram examinations, most                 recent 01/04/2015. CAD; Risk Factors:Diabetes, Hypertension and                 Dyslipidemia. ESRD.  Sonographer:    Raquel Sarna Senior RDCS Referring Phys: [phone] [facility] MARTIN  Sonographer Comments: Technically difficult study due to poor echo windows. IMPRESSIONS  1. Left ventricular ejection fraction, by visual estimation, is 65 to 70%. The left ventricle has hyperdynamic function. There is moderately increased  left ventricular hypertrophy.  2. Definity contrast agent was given IV to delineate the left ventricular endocardial borders.  3. Left ventricular diastolic parameters are consistent with Grade I diastolic dysfunction (impaired relaxation).  4. The left ventricle has no regional wall motion abnormalities.  5. Global right ventricle has normal systolic function.The right ventricular size is mildly enlarged. No increase in right ventricular wall thickness.  6. Left atrial size was normal.  7. Right atrial size was normal.  8. The pericardial effusion is posterior to the left ventricle.  9. Trivial pericardial effusion is present. 10. The mitral valve was not well visualized. Trivial mitral valve regurgitation. 11. The tricuspid  valve is not well visualized. 12. The aortic valve is tricuspid. Aortic valve regurgitation is not visualized. Mild aortic valve sclerosis without stenosis. 13. The pulmonic valve was grossly normal. Pulmonic valve regurgitation is not visualized. 14. The inferior vena cava is normal in size with <50% respiratory variability, suggesting right atrial pressure of 8 mmHg. 15. The interatrial septum was not well visualized. FINDINGS  Left Ventricle: Left ventricular ejection fraction, by visual estimation, is 65 to 70%. The left ventricle has hyperdynamic function. Definity contrast agent was given IV to delineate the left ventricular endocardial borders. The left ventricle has no regional wall motion abnormalities. There is moderately increased left ventricular hypertrophy. Left ventricular diastolic parameters are consistent with Grade I diastolic dysfunction (impaired relaxation). Indeterminate filling pressures. Right Ventricle: The right ventricular size is mildly enlarged. No increase in right ventricular wall thickness. Global RV systolic function is has normal systolic function. Left Atrium: Left atrial size was normal in size. Right Atrium: Right atrial size was normal in size Pericardium: Trivial pericardial effusion is present. The pericardial effusion is posterior to the left ventricle. Mitral Valve: The mitral valve was not well visualized. Trivial mitral valve regurgitation. Tricuspid Valve: The tricuspid valve is not well visualized. Tricuspid valve regurgitation is not demonstrated. Aortic Valve: The aortic valve is tricuspid. Aortic valve regurgitation is not visualized. Mild aortic valve sclerosis is present, with no evidence of aortic valve stenosis. Pulmonic Valve: The pulmonic valve was grossly normal. Pulmonic valve regurgitation is not visualized. Pulmonic regurgitation is not visualized. Aorta: The aortic root and ascending aorta are structurally normal, with no evidence of dilitation. Venous:  The inferior vena cava is normal in size with less than 50% respiratory variability, suggesting right atrial pressure of 8 mmHg. IAS/Shunts: The interatrial septum was not well visualized.  LEFT VENTRICLE PLAX 2D LVIDd:         3.84 cm  Diastology LVIDs:         2.29 cm  LV e' lateral:   5.11 cm/s LV PW:         1.25 cm  LV E/e' lateral: 17.2 LV IVS:        1.27 cm  LV e' medial:    5.11 cm/s LVOT diam:     2.00 cm  LV E/e' medial:  17.2 LV SV:         46 ml LV SV Index:   18.01 LVOT Area:     3.14 cm  RIGHT VENTRICLE RV S prime:     12.40 cm/s TAPSE (M-mode): 1.5 cm LEFT ATRIUM             Index       RIGHT ATRIUM           Index LA diam:        4.20 cm 1.81 cm/m  RA  Area:     20.50 cm LA Vol (A2C):   56.6 ml 24.45 ml/m RA Volume:   66.70 ml  28.81 ml/m LA Vol (A4C):   40.1 ml 17.32 ml/m LA Biplane Vol: 49.5 ml 21.38 ml/m  AORTIC VALVE LVOT Vmax:   78.30 cm/s LVOT Vmean:  53.400 cm/s LVOT VTI:    0.152 m  AORTA Ao Root diam: 2.70 cm Ao Asc diam:  3.30 cm MITRAL VALVE MV Area (PHT): 2.60 cm              SHUNTS MV PHT:        84.68 msec            Systemic VTI:  0.15 m MV Decel Time: 292 msec              Systemic Diam: 2.00 cm MV E velocity: 87.80 cm/s  103 cm/s MV A velocity: 102.00 cm/s 70.3 cm/s MV E/A ratio:  0.86        1.5  Lyman Bishop MD Electronically signed by Lyman Bishop MD Signature Date/Time: 01/16/2019/10:43:37 AM    Final    Disposition   Pt is being discharged home today in good condition.  Follow-up Plans & Appointments    Follow-up Information    Waynetta Sandy, MD Follow up in 3 week(s).   Specialties: Vascular Surgery, Cardiology Why: office will call Contact information: [place] [address] [place], [place], PA-C Follow up.   Specialties: Cardiology, Radiology Why: Cardiology hospital follow-up on 02/03/2019 at 8:30 AM.  Please arrive 15 minutes early for check-in.   Contact information: [address] [place] [address] [phone]        Brenda Lima, MD. Schedule an appointment as soon as possible for a visit.   Specialty: Internal Medicine Why: Follow up for diabetes management as soon as possible.  Contact information: [place] [address] [phone]        Brenda Latch, MD .   Specialty: Cardiology Contact information: [address] [place] [address] [place] [address] [phone]          Discharge Instructions    AMB Referral to Cardiac Rehabilitation - Phase II   Complete by: As directed    Diagnosis:  Coronary Stents NSTEMI     After initial evaluation and assessments completed: Virtual Based Care may be provided alone or in conjunction with Phase 2 Cardiac Rehab based on patient barriers.: Yes   Diet - low sodium heart healthy   Complete by: As directed    Increase activity slowly   Complete by: As directed       Discharge Medications   Allergies as of 01/23/2019      Reactions   Amlodipine Swelling   Atorvastatin    Elevated LFT's   Clonidine Derivatives Swelling   Limbs swell   Doxycycline Nausea And Vomiting   "I threw up for 3 hours"   Welchol [colesevelam Hcl] Nausea Only      Medication List    TAKE these medications   aspirin 81 MG EC tablet Take 1 tablet (81 mg total) by mouth daily.   B-12 PO Take 1 tablet by mouth daily.   calcium acetate 667 MG capsule Commonly known as: PHOSLO Take 667 mg by mouth 3 (three) times daily with meals.   cinacalcet 90 MG tablet Commonly known as: SENSIPAR Take 90 mg by mouth at  bedtime.   ferric citrate 1 GM 210 MG(Fe) tablet Commonly known as: Auryxia Take 2 tablets (420 mg total) by mouth 3 (three) times daily with meals. What changed: how much to take   folic acid 1 MG tablet Commonly known as: FOLVITE Take 1 tablet (1 mg total) by mouth daily.   gabapentin 100 MG capsule Commonly known as: NEURONTIN Take 100 mg by mouth 2 (two) times daily.    gentamicin cream 0.1 % Commonly known as: GARAMYCIN Apply 1 application topically See admin instructions. Apply to exit site daily as directed.   metFORMIN 500 MG tablet Commonly known as: Glucophage Take 1 tablet (500 mg total) by mouth 2 (two) times daily with a meal.   metoprolol tartrate 25 MG tablet Commonly known as: LOPRESSOR Take 0.5 tablets (12.5 mg total) by mouth 2 (two) times daily. Do not take if blood pressure less than 90 on top or pulse less than 60.   nitroGLYCERIN 0.4 MG SL tablet Commonly known as: NITROSTAT PLACE 1 TABLET (0.4 MG TOTAL) UNDER THE TONGUE EVERY 5 (FIVE) MINUTES AS NEEDED FOR CHEST PAIN.   ondansetron 8 MG disintegrating tablet Commonly known as: Zofran ODT Take 1 tablet (8 mg total) by mouth every 8 (eight) hours as needed for nausea or vomiting.   rosuvastatin 10 MG tablet Commonly known as: CRESTOR Take 1 tablet (10 mg total) by mouth daily.   ticagrelor 90 MG Tabs tablet Commonly known as: BRILINTA Take 1 tablet (90 mg total) by mouth 2 (two) times daily.          Outstanding Labs/Studies   Follow up CBC. Nephrology to follow up metabolic panel.   Duration of Discharge Encounter   Greater than 30 minutes including physician time.  Signed, Daune Perch, NP 01/23/2019, 12:58 PM

## 2019-01-23 NOTE — Progress Notes (Signed)
PROGRESS NOTE  Brenda Davis ZES:[id number] DOB: 08/05/1965 DOA: 01/15/2019 PCP: [name], MD   LOS: 8 days   Brief narrative:  As per HPI, 54 year old female with past medical history of morbid obesity, ESRD on peritoneal dialysis, diabetes mellitus, cirrhosis of liver, coronary artery disease, was admitted to the hospital on 12/27 with NSTEMI.  Cardiac cath was complicated by retroperitoneal hemorrhage.  Left heart cath on 1228 showed three-vessel coronary artery disease.  On 12/29 opted to have a stent over CABG and drug-eluting stents x2 were placed in followed by retroperitoneal bleed.  Patient was then taken to the OR for repair on 12/29.  Postoperatively, patient had metabolic acidosis and shock requiring vasopressors.  BiPAP was started for hypercarbic respiratory failure.  Patient was then admitted to the ICU.  Patient was taken off vasopressors on 01/20/2019 and was considered for transfer out of the ICU.  Assessment/Plan:  Principal Problem:   NSTEMI (non-ST elevated myocardial infarction) ([employer]) Active Problems:   Dyslipidemia, goal LDL below 70   Morbid obesity with BMI of 40.0-44.9, adult (HCC)   S/P laparoscopic sleeve gastrectomy   Acute respiratory failure with hypoxia and hypercapnia (HCC)   ESRD on peritoneal dialysis ([place])   DM (diabetes mellitus), type 2 with renal complications (HCC)   Hepatic cirrhosis (HCC)   Coronary artery disease involving native coronary artery of native heart with unstable angina pectoris (HCC)   Postprocedural hypotension   Hyperkalemia   Retroperitoneal hemorrhage - post Cath/PCI - taken to OR for drainage & CFA repair   Acute blood loss anemia - resolved, transfused in OR - Hgb stable   Increased anion gap metabolic acidosis   Shock circulatory (HCC) - secondary to acute blood loss anemia, elevated WBC (? sepsis vs post-op inflammation).h  Shock: Likely hemorrhagic. improved.  Patient did have any leukocytosis with no  clear source of infection.  Trending down leukocytosis from 28> 24>15.  No obvious signs of infection so IV Zosyn was discontinued..  Acute hypercarbic respiratory failure> improved  Did require BIPAP during ICU, currently on room air.  Anion gap acidosis: likely renal failure +/- lactic acidosis in the setting of shock.   Nephrology on board. . Continue peritoneal dialysis.  Continue renal diet with fluid restriction.  Non-ST elevation MI.  CAD: Candidate for CABG, she has elected to try PCI first, which was done on 12/29.  Status post PCI to RCA and ramus intermedius.  cardiology, CVTS on board.  Continue Brilinta  Retroperitoneal hematoma: Complication of LHC 26/33. To OR by Dr Donzetta Matters for repair. s/p 2 units PRBC.  Continue Aranesp  Diabetes mellitus continue SSI, Levemir, closely monitor blood glucose levels.  Last point-of-care glucose was 114  ESRD on PD. Nephrology on board.  Continue Sensipar and Aranesp  Hypokalemia, Hypomagnesemia, hyponatremia.  Patient received KCl and magnesium oxide yesterday.  Could be adjusted with dialysis.  Mild diarrhea could be secondary to antibiotic.  Improved.  Added probiotic yesterday.  Imodium as needed.  Off antibiotics  VTE Prophylaxis: Heparin subcu  Code Status: Full code  Family Communication:  Spoke with the patient in detail  Disposition Plan: Patient was seen by physical therapy and recommended home with home health.  Medically stable for disposition home if okay with cardiology and nephrology.  Consultants: Cardiology CVTS Nephrology  Procedures:  Central line  Arterial line  Cardiac cath    Antibiotics: Zosyn 12/30 >>1/3  Anti-infectives (From admission, onward)   Start     Dose/Rate Route Frequency  Ordered Stop   01/18/19 0400  piperacillin-tazobactam (ZOSYN) IVPB 2.25 g  Status:  Discontinued     2.25 g 100 mL/hr over 30 Minutes Intravenous Every 8 hours 01/18/19 0333 01/22/19 1138     Subjective: Today,  patient denies any shortness of breath cough chest pain or palpitation.  Objective: Vitals:   01/22/19 1315 01/22/19 1945  BP: (!) 109/51 127/78  Pulse: 75 80  Resp: 16 16  Temp: 98.6 F (37 C) 98.1 F (36.7 C)  SpO2: 98% 100%   No intake or output data in the 24 hours ending 01/23/19 1320 Filed Weights   01/21/19 0500 01/22/19 0402 01/22/19 1315  Weight: (!) 143.9 kg (!) 144.9 kg (!) 141.1 kg   Body mass index is 51.76 kg/m.   Physical Exam: GENERAL: Patient is alert awake communicative.  Morbidly obese. HENT: No scleral pallor or icterus. Pupils equally reactive to light. Oral mucosa is moist NECK: is supple, no palpable thyroid enlargement. CHEST:  Diminished breath sounds bilaterally. CVS: S1 and S2 heard, no murmur. Regular rate and rhythm. No pericardial rub. ABDOMEN: Soft, non-tender, bowel sounds are present.  Peritoneal dialysis catheter in place. EXTREMITIES: Bilateral lower extremity trace edema.  Left upper extremity AV fistula in place. CNS: Cranial nerves are intact. No focal motor or sensory deficits. SKIN: warm and dry without rashes.  Data Review: I have personally reviewed the following laboratory data and studies,  CBC: Recent Labs  Lab 01/18/19 0420 01/18/19 2358 01/19/19 0409 01/20/19 0221 01/21/19 0259 01/22/19 2320  WBC 35.9*  --  28.8* 24.7* 15.3* 15.6*  HGB 10.9* 8.5* 8.1* 7.7* 8.8* 9.0*  HCT 32.6* 25.0* 23.5* 22.7* 25.7* 27.0*  MCV 97.3  --  95.1 94.6 92.4 95.4  PLT 302  --  218 198 193 939   Basic Metabolic Panel: Recent Labs  Lab 01/18/19 0420 01/18/19 0847 01/18/19 2358 01/19/19 0409 01/20/19 0221 01/21/19 0259 01/22/19 2309 01/22/19 2320  NA 136 136 133* 135 134* 136 128*  --   K 6.0* 6.9* 4.4 4.3 3.6 3.3* 3.2*  --   CL 96* 96*  --  94* 91* 94* 90*  --   CO2 15* 18*  --  22 25 24 22   --   GLUCOSE 330* 380*  --  259* 218* 167* 118*  --   BUN 78* 77*  --  69* 67* 61* 57*  --   CREATININE 17.26* 17.28*  --  15.08* 14.19*  13.02* 13.06*  --   CALCIUM 7.4* 7.1*  --  6.9* 6.6* 6.5* 6.8*  --   MG  --   --   --   --  1.7 1.6*  --  1.9  PHOS 11.5*  --   --  8.9* 8.1* 7.8* 7.5*  --    Liver Function Tests: Recent Labs  Lab 01/18/19 0420 01/19/19 0409 01/20/19 0221 01/21/19 0259 01/22/19 2309  AST  --  94*  --   --   --   ALT  --  69*  --   --   --   ALKPHOS  --  56  --   --   --   BILITOT  --  0.5  --   --   --   PROT  --  5.5*  --   --   --   ALBUMIN 2.5* 2.5*  2.5* 2.5* 2.6* 2.5*   No results for input(s): LIPASE, AMYLASE in the last 168 hours. No results for input(s): AMMONIA in  the last 168 hours. Cardiac Enzymes: No results for input(s): CKTOTAL, CKMB, CKMBINDEX, TROPONINI in the last 168 hours. BNP (last 3 results) Recent Labs    02/24/18 1027 06/18/18 2314  BNP 36.5 110.2*    ProBNP (last 3 results) No results for input(s): PROBNP in the last 8760 hours.  CBG: Recent Labs  Lab 01/22/19 1131 01/22/19 1604 01/22/19 2156 01/23/19 0617 01/23/19 1116  GLUCAP 90 84 101* 151* 114*   Recent Results (from the past 240 hour(s))  SARS CORONAVIRUS 2 (TAT 6-24 HRS) Nasopharyngeal Nasopharyngeal Swab     Status: None   Collection Time: 01/15/19  9:38 PM   Specimen: Nasopharyngeal Swab  Result Value Ref Range Status   SARS Coronavirus 2 NEGATIVE NEGATIVE Final    Comment: (NOTE) SARS-CoV-2 target nucleic acids are NOT DETECTED. The SARS-CoV-2 RNA is generally detectable in upper and lower respiratory specimens during the acute phase of infection. Negative results do not preclude SARS-CoV-2 infection, do not rule out co-infections with other pathogens, and should not be used as the sole basis for treatment or other patient management decisions. Negative results must be combined with clinical observations, patient history, and epidemiological information. The expected result is Negative. Fact Sheet for Patients: SugarRoll.be Fact Sheet for Healthcare  Providers: https://www.woods-mathews.com/ This test is not yet approved or cleared by the [place] FDA and  has been authorized for detection and/or diagnosis of SARS-CoV-2 by FDA under an Emergency Use Authorization (EUA). This EUA will remain  in effect (meaning this test can be used) for the duration of the COVID-19 declaration under Section 56 4(b)(1) of the Act, 21 U.S.C. section 360bbb-3(b)(1), unless the authorization is terminated or revoked sooner. Performed at [facility], [place] [address], [place], [place] [address]   MRSA PCR Screening     Status: None   Collection Time: 01/17/19 11:24 PM   Specimen: Nasal Mucosa; Nasopharyngeal  Result Value Ref Range Status   MRSA by PCR NEGATIVE NEGATIVE Final    Comment:        The GeneXpert MRSA Assay (FDA approved for NASAL specimens only), is one component of a comprehensive MRSA colonization surveillance program. It is not intended to diagnose MRSA infection nor to guide or monitor treatment for MRSA infections. Performed at [facility], [place] [address] [place], [place] [address]   Culture, blood (routine x 2)     Status: None (Preliminary result)   Collection Time: 01/18/19  6:20 AM   Specimen: BLOOD RIGHT HAND  Result Value Ref Range Status   Specimen Description BLOOD RIGHT HAND  Final   Special Requests   Final    BOTTLES DRAWN AEROBIC ONLY Blood Culture results may not be optimal due to an inadequate volume of blood received in culture bottles   Culture   Final    NO GROWTH 4 DAYS Performed at [facility], [place] [address] [place], [place] [address]    Report Status PENDING  Incomplete  Culture, blood (routine x 2)     Status: None (Preliminary result)   Collection Time: 01/18/19  6:28 AM   Specimen: BLOOD RIGHT HAND  Result Value Ref Range Status   Specimen Description BLOOD RIGHT HAND  Final   Special Requests   Final    BOTTLES DRAWN AEROBIC ONLY Blood Culture results may not  be optimal due to an inadequate volume of blood received in culture bottles   Culture   Final    NO GROWTH 4  DAYS Performed at [facility], [place] [address], [place], [place] [address]    Report Status PENDING  Incomplete     Studies: No results found.  Scheduled Meds: . acidophilus  2 capsule Oral Daily  . aspirin EC  81 mg Oral Daily  . calcium acetate  1,334 mg Oral TID WC  . Chlorhexidine Gluconate Cloth  6 each Topical Daily  . cinacalcet  90 mg Oral Q supper  . darbepoetin (ARANESP) injection - NON-DIALYSIS  60 mcg Subcutaneous Q Fri-1800  . ferric citrate  420 mg Oral TID WC  . folic acid  1 mg Oral Daily  . gabapentin  100 mg Oral BID  . gentamicin ointment   Topical Daily  . heparin  5,000 Units Subcutaneous Q8H  . insulin aspart  0-5 Units Subcutaneous QHS  . insulin aspart  0-9 Units Subcutaneous TID WC  . insulin detemir  10 Units Subcutaneous BID  . mouth rinse  15 mL Mouth Rinse BID  . metoprolol tartrate  12.5 mg Oral BID  . nitroGLYCERIN  0.5 inch Topical Q6H  . potassium chloride  20 mEq Oral Once  . rosuvastatin  10 mg Oral Daily  . sodium chloride flush  10-40 mL Intracatheter Q12H  . sodium chloride flush  3 mL Intravenous Once  . sodium chloride flush  3 mL Intravenous Q12H  . ticagrelor  90 mg Oral BID    Continuous Infusions: . sodium chloride Stopped (01/19/19 2014)  . dialysis solution 1.5% low-MG/low-CA    . dialysis solution 1.5% low-MG/low-CA    . dialysis solution 2.5% low-MG/low-CA    . sodium chloride 250 mL (01/18/19 1836)     Flora Lipps, MD  [facility] 01/23/2019

## 2019-01-23 NOTE — Progress Notes (Signed)
Progress Note  Patient Name: Brenda Davis Date of Encounter: 01/23/2019  Primary Cardiologist: Skeet Latch, MD   Subjective   Feeling well.  Breathing at baseline.  No more diarrhea.   Inpatient Medications    Scheduled Meds: . acidophilus  2 capsule Oral Daily  . aspirin EC  81 mg Oral Daily  . calcium acetate  1,334 mg Oral TID WC  . Chlorhexidine Gluconate Cloth  6 each Topical Daily  . cinacalcet  90 mg Oral Q supper  . darbepoetin (ARANESP) injection - NON-DIALYSIS  60 mcg Subcutaneous Q Fri-1800  . ferric citrate  420 mg Oral TID WC  . folic acid  1 mg Oral Daily  . gabapentin  100 mg Oral BID  . gentamicin ointment   Topical Daily  . heparin  5,000 Units Subcutaneous Q8H  . insulin aspart  0-5 Units Subcutaneous QHS  . insulin aspart  0-9 Units Subcutaneous TID WC  . insulin detemir  10 Units Subcutaneous BID  . mouth rinse  15 mL Mouth Rinse BID  . metoprolol tartrate  12.5 mg Oral BID  . nitroGLYCERIN  0.5 inch Topical Q6H  . potassium chloride  20 mEq Oral Once  . rosuvastatin  10 mg Oral Daily  . sodium chloride flush  10-40 mL Intracatheter Q12H  . sodium chloride flush  3 mL Intravenous Once  . sodium chloride flush  3 mL Intravenous Q12H  . ticagrelor  90 mg Oral BID   Continuous Infusions: . sodium chloride Stopped (01/19/19 2014)  . dialysis solution 1.5% low-MG/low-CA    . dialysis solution 1.5% low-MG/low-CA    . dialysis solution 2.5% low-MG/low-CA    . sodium chloride 250 mL (01/18/19 1836)   PRN Meds: sodium chloride, acetaminophen, acetaminophen, fentaNYL (SUBLIMAZE) injection, heparin, dianeal solution for CAPD/CCPD with heparin, loperamide, nitroGLYCERIN, ondansetron (ZOFRAN) IV, ondansetron, oxyCODONE-acetaminophen, sodium chloride, sodium chloride flush, sodium chloride flush   Vital Signs    Vitals:   01/22/19 0800 01/22/19 1135 01/22/19 1315 01/22/19 1945  BP: (!) 97/58 108/61 (!) 109/51 127/78  Pulse: 96 90 75 80    Resp: 20 16 16 16   Temp: 98.4 F (36.9 C) 98.2 F (36.8 C) 98.6 F (37 C) 98.1 F (36.7 C)  TempSrc: Oral Oral Oral Oral  SpO2: 97% 97% 98% 100%  Weight:   (!) 141.1 kg   Height:        Intake/Output Summary (Last 24 hours) at 01/23/2019 1134 Last data filed at 01/22/2019 1300 Gross per 24 hour  Intake 110 ml  Output --  Net 110 ml   Last 3 Weights 01/22/2019 01/22/2019 01/21/2019  Weight (lbs) 311 lb 1.1 oz 319 lb 7.1 oz 317 lb 3.9 oz  Weight (kg) 141.1 kg 144.9 kg 143.9 kg      Telemetry    Sinus rhythm.  No events  - Personally Reviewed  ECG    N/a - Personally Reviewed  Physical Exam   VS:  BP 127/78 (BP Location: Right Arm)   Pulse 80   Temp 98.1 F (36.7 C) (Oral)   Resp 16   Ht 5' 5"  (1.651 m)   Wt (!) 141.1 kg   SpO2 100%   BMI 51.76 kg/m  , BMI Body mass index is 51.76 kg/m. GENERAL:  Well appearing. No acute distress. HEENT: Pupils equal round and reactive, fundi not visualized, oral mucosa unremarkable NECK:  No jugular venous distention, waveform within normal limits, carotid upstroke brisk and symmetric, no bruits  LUNGS:  Clear to auscultation bilaterally HEART:  RRR.  PMI not displaced or sustained,S1 and S2 within normal limits, no S3, no S4, no clicks, no rubs, no murmurs ABD:  Flat, positive bowel sounds normal in frequency in pitch, no bruits, no rebound, no guarding, no midline pulsatile mass, no hepatomegaly, no splenomegaly EXT:  1 plus pulses throughout, no edema, no cyanosis no clubbing SKIN:  No rashes no nodules.  Re groin with staples C/D/I.  No drainage NEURO:  Cranial nerves II through XII grossly intact, motor grossly intact throughout PSYCH:  Cognitively intact, oriented to person place and time   Labs    High Sensitivity Troponin:   Recent Labs  Lab 01/15/19 1448 01/15/19 1806  TROPONINIHS 43* 781*      Chemistry Recent Labs  Lab 01/19/19 0409 01/20/19 0221 01/21/19 0259 01/22/19 2309  NA 135 134* 136 128*  K 4.3 3.6  3.3* 3.2*  CL 94* 91* 94* 90*  CO2 22 25 24 22   GLUCOSE 259* 218* 167* 118*  BUN 69* 67* 61* 57*  CREATININE 15.08* 14.19* 13.02* 13.06*  CALCIUM 6.9* 6.6* 6.5* 6.8*  PROT 5.5*  --   --   --   ALBUMIN 2.5*  2.5* 2.5* 2.6* 2.5*  AST 94*  --   --   --   ALT 69*  --   --   --   ALKPHOS 56  --   --   --   BILITOT 0.5  --   --   --   GFRNONAA 2* 3* 3* 3*  GFRAA 3* 3* 3* 3*  ANIONGAP 19* 18* 18* 16*     Hematology Recent Labs  Lab 01/20/19 0221 01/21/19 0259 01/22/19 2320  WBC 24.7* 15.3* 15.6*  RBC 2.40* 2.78* 2.83*  HGB 7.7* 8.8* 9.0*  HCT 22.7* 25.7* 27.0*  MCV 94.6 92.4 95.4  MCH 32.1 31.7 31.8  MCHC 33.9 34.2 33.3  RDW 18.1* 17.5* 17.3*  PLT 198 193 237    BNPNo results for input(s): BNP, PROBNP in the last 168 hours.   DDimer No results for input(s): DDIMER in the last 168 hours.   Radiology    No results found.  Cardiac Studies   Diagnostic                                                           Intervention           Echo 01/16/19:  IMPRESSIONS    1. Left ventricular ejection fraction, by visual estimation, is 65 to 70%. The left ventricle has hyperdynamic function. There is moderately increased left ventricular hypertrophy.  2. Definity contrast agent was given IV to delineate the left ventricular endocardial borders.  3. Left ventricular diastolic parameters are consistent with Grade I diastolic dysfunction (impaired relaxation).  4. The left ventricle has no regional wall motion abnormalities.  5. Global right ventricle has normal systolic function.The right ventricular size is mildly enlarged. No increase in right ventricular wall thickness.  6. Left atrial size was normal.  7. Right atrial size was normal.  8. The pericardial effusion is posterior to the left ventricle.  9. Trivial pericardial effusion is present. 10. The mitral valve was not well visualized. Trivial mitral valve regurgitation. 11. The tricuspid valve is not well  visualized.  12. The aortic valve is tricuspid. Aortic valve regurgitation is not visualized. Mild aortic valve sclerosis without stenosis. 13. The pulmonic valve was grossly normal. Pulmonic valve regurgitation is not visualized. 14. The inferior vena cava is normal in size with <50% respiratory variability, suggesting right atrial pressure of 8 mmHg. 15. The interatrial septum was not well visualized.  Patient Profile     54 y.o. female with moderate CAD, chronic chest pain, diabetes, hypertension, hyperlipidemia, ESRD on PD, and morbid obesity admitted with NSTEMI and found to have progression of CAD.  She was offered CABG but elected staged PCI.  She underwent PCI of RCA and RI/OM1 with medical management of the LAD due to extensive calcification.  She developed RP hematoma/hemorrhage requiring surgery.  Post surgery she developed hypotension and metabolic/respiratory acidosis and hypoxic respiratory failure.  She required BiPAP, PD and levophed/vasopressin for BP support.  Assessment & Plan    # NSTEMI: # Hyperlipidemia:  Patient declined CABG and underwent PCI of RCA and RI/OM1.  LAD medically managed.  She remains on DAPT.  H/h stable after RP bleed.  BP continues to run low. Will add metoprolol 12.5 mg bid with HR/BP parameters.  # Shock: Resovled.  Occurred due to acute blood loss.  LVEF 65-70%, so less likely cardiogenic.  Now stable off pressors.   # Retroperitoneal bleed: Required surgical intervention.  Staples in place.  Recommend keeping dry with ABD dressing.  F/u with Dr. Donzetta Matters or PA for staple removal in 3-4 weeks.  # Acute hypoxic/hypercarbic respiratory failure: Resolved.  Now off BiPap and on room air.  Hemodynamically stable.   # ESRD on PD: Continue per Nephrology.  # DM: She was not on treatment prior to admission.  Reportedly improved in the past after lap band.  She has been getting SSI here.  Will start metformin 547m bid at discharge.  She will need outpatient  follow up.  # Leukocytosis: Improving.  Zosyn discontinued.  Infectious source not identified.  # Diarrhea: Resolved.  Related to antibiotics.    # PT: Will arrange home PT as recommended.    # Dispo: OK to discharge today.  We will arrange.  For questions or updates, please contact [facility]ust[facility]ordPlease consult [email or web address] for contact info under        Signed, TSkeet Latch MD  01/23/2019, 11:34 AM

## 2019-01-24 ENCOUNTER — Telehealth: Payer: Self-pay | Admitting: *Deleted

## 2019-01-24 NOTE — Telephone Encounter (Signed)
Pt was on TCM report admitted 01/15/19 diagnosed with NSTEMI.  Echocardiogram done on 01/16/2019 showed dynamic LV systolic function with EF 65-70% with moderately increased LVH, no regional wall motion abnormalities. Pt underwent left heart cath on which showed severe multivessel disease with progression of calcific disease in the mid LAD, proximal LAD and tortuous mid OM1. Pt went back to the Cath Lab for PCI.  It was felt that PCI of the RCA with possible PCI of the ramus lesion would be the most beneficial to treat.  Her LAD lesion would be treated medically. Pt D/C 01/23/19, and will f/u w/ PA, Kerin Ransom on 02/03/19../l;mb

## 2019-01-24 NOTE — Telephone Encounter (Signed)
Patient contacted regarding discharge from [facility] on 01/23/19.  Patient understands to follow up with provider  LUKE KILROY PA} on 02/03/19 at 8:30 AM at [facility]. Patient understands discharge instructions? YES Patient understands medications and regiment? YES Patient understands to bring all medications to this visit? YES   While speaking with pt Pt thought that Dr Oval Linsey was going to prescribe pill for yeast infection Will forward to Dr Oval Linsey for recommendations .Adonis Housekeeper

## 2019-01-26 ENCOUNTER — Telehealth (HOSPITAL_COMMUNITY): Payer: Self-pay

## 2019-01-26 ENCOUNTER — Telehealth: Payer: Self-pay | Admitting: Cardiovascular Disease

## 2019-01-26 NOTE — Telephone Encounter (Signed)
Contacted Francis. LMTCB

## 2019-01-26 NOTE — Telephone Encounter (Signed)
Pt c/o BP issue: STAT if pt c/o blurred vision, one-sided weakness or slurred speech  1. What are your last 5 BP readings?  83/58 Unable to obtain more readings.  2. Are you having any other symptoms (ex. Dizziness, headache, blurred vision, passed out)? no  3. What is your BP issue? Hypotension  Dub Mikes from [facility] is also requesting a verbal clearance for patient to begin physical therapy. LVM if no answer.  [phone]

## 2019-01-26 NOTE — Telephone Encounter (Signed)
Attempted to call patient in regards to Cardiac Rehab - LM on VM 

## 2019-01-26 NOTE — Telephone Encounter (Signed)
Due to department closure will wait to check pt's insurance after CR has reopen.

## 2019-01-27 ENCOUNTER — Telehealth: Payer: Self-pay | Admitting: Cardiology

## 2019-01-27 NOTE — Telephone Encounter (Signed)
[facility] calling to get a verbal order/ clearnace that it is ok for pt to being PT at this time. She would also like to know the requested frequency of the PT. She would like a call back confirming a verbal order then she can fax over a document to be signed by MD.   Concerns regarding pts low BP yesterday which was 80/55 and once she sat pt up pt became very dizzy. Apparently pt has been pulling off significant amount of fluid during her home peritoneal dialysis at night. Home Health spoke with dialysis nurse over the phone yesterday who advised pt to go back to pulling regular amount of fluid off each night instead of increasing.   Pts BP from today is 107/50 and her husband reports that she is feeling much better. She did not pull extra fluid last night during her dialysis.   Tharon Aquas from [facility] at [phone] states it is ok to leave a message if she does not answer because it is a secure line.

## 2019-01-27 NOTE — Telephone Encounter (Signed)
Left a message on Brenda Davis's secure VM per her request regarding home health PT orders from Dr. Oval Linsey. Advised her to call back with any questions.

## 2019-01-27 NOTE — Telephone Encounter (Signed)
It looks like PT was recommended 3 times per week when she was in the hospital.  Continue current medications unless she is dizzy.

## 2019-01-27 NOTE — Telephone Encounter (Signed)
New Message  Pt was returning phone call from yesterday.  Please call back

## 2019-01-30 ENCOUNTER — Other Ambulatory Visit: Payer: Self-pay | Admitting: Nephrology

## 2019-01-30 ENCOUNTER — Other Ambulatory Visit: Payer: Self-pay | Admitting: Internal Medicine

## 2019-01-30 ENCOUNTER — Telehealth: Payer: Self-pay | Admitting: Cardiovascular Disease

## 2019-01-30 DIAGNOSIS — K661 Hemoperitoneum: Secondary | ICD-10-CM

## 2019-01-30 DIAGNOSIS — R58 Hemorrhage, not elsewhere classified: Secondary | ICD-10-CM

## 2019-01-30 NOTE — Telephone Encounter (Signed)
Spoke with pt who states SBP has been running 90s to low 100s since d/c from hospital. BP today 98/69 which she states dropped to 84/74 when she sat up so she had to lie back down to help increase BP. She states she has mostly been in bed all day since last Friday because her BP drops when she sits up and she becomes symptomatic (dizziness, blurred vision, fatigue, 'winded'/SOB). She states she elevates legs to help with hypotension. Pt states she was advised by dialysis center to hold metoprolol to avoid bottoming out BP-wise so she has not taken metoprolol since last Saturday evening. Informed pt of metoprolol 25 mg instructions: "Sig: Take 0.5 tablets (12.5 mg total) by mouth 2 (two) times daily. Do not take if blood pressure less than 90 on top or pulse less than 60. " Pt states she was not advised of this and has not been taking metoprolol per Sig. Informed pt that instructions should be on her medicine bottle. Informed pt that since she was advised to hold metoprolol she should continue to do so until she is evaluated at [facility]. Inquired if pt has any fluid restrictions on dialysis. Pt denies. She states fluid intake low and she drinks one bottle of water daily by choice. Advised pt to increase fluids to two 16.9 oz bottles of water daily and consult dialysis center to advise on fluid intake. Confirmed with pt that she had CT abd in hospital that showed retroperitoneal hemorrhage and that this was treated in OR and pt received blood transfusions. Informed pt may need repeat CBC. Pt states she has been receiving peritoneal dialysis and noticed frank red blood in tubing but this only tinted dialysis bag to light pink. She states dialysis nurse came by for a home visit and she was eventually set up for an appt for CT scan of abd for 02/09/19. Advised pt to monitor BP. Pt already has TOC appt 1/15. Next available appt 1/13. Pt agreeable with sooner appt. Appt set with Coletta Memos, NP on 1/13. Advised to f/u in ED  if symptoms increase in severity b/n now and appt. Discussed above with DOD Dr. Stanford Breed; agreed with pt f/u in office 1/13 for further eval.

## 2019-01-30 NOTE — Telephone Encounter (Signed)
Pt c/o BP issue: STAT if pt c/o blurred vision, one-sided weakness or slurred speech  1. What are your last 5 BP readings? 98/69 and 84/74  2. Are you having any other symptoms (ex. Dizziness, headache, blurred vision, passed out)? Dizziness, blurred vision, fatigued and feels winded  3. What is your BP issue? Patient states that when she is sitting down her blood pressure is around the 90's then when she stands up it drops. See readings above, first one was taken when she was sitting then the second one was taken when she stood up. She had a stent put in at the end of December and has a f/u with Kerin Ransom this Friday at 8:30am.

## 2019-01-31 NOTE — Progress Notes (Signed)
Cardiology Clinic Note   Patient Name: Brenda Davis Date of Encounter: 02/01/2019  Primary Care Provider:  [name], MD Primary Cardiologist:  Brenda Latch, MD  Patient Profile    Brenda Davis 54 year old female presents today for a follow-up of her coronary artery disease, CHF, and NSTEMI.  Past Medical History    Past Medical History:  Diagnosis Date  . Anemia   . CAD (coronary artery disease)    a. cath in 02/2018 showing moderate 3-vessel CAD with 45% mid-LADm 55% LCx and 65% RCA stenosis which was not significant by FFR  . ESRD on dialysis ([place])   . Gout   . HCAP (healthcare-associated pneumonia) 05/06/2016  . History of blood transfusion    "related to surgery"  . Hyperlipidemia   . Hypertension   . Morbid obesity ([place])   . Pain    LEFT SHOULDER PAIN - WAS SEEN AT AN URGENT CARE - GIVEN SLING FOR COMFORT AND TOLD ROM AS TOLERATED.  Marland Kitchen Palpitations 09/24/2016  . Peritonitis, dialysis-associated ([place])   . Type II diabetes mellitus ([place])    "gastric sleeve OR corrected this" (05/06/2016)   Past Surgical History:  Procedure Laterality Date  . AV FISTULA PLACEMENT Left 01/03/2015   Procedure: BRACHIAL CEPHALIC ARTERIOVENOUS  FISTULA CREATION LEFT ARM;  Surgeon: Angelia Mould, MD;  Location: [facility];  Service: Vascular;  Laterality: Left;  . CARPAL TUNNEL RELEASE Right   . [place]  . CHOLECYSTECTOMY  09/14/2017  . CORONARY STENT INTERVENTION N/A 01/17/2019   Procedure: CORONARY STENT INTERVENTION;  Surgeon: Jettie Booze, MD;  Location: [place];  Service: Cardiovascular;  Laterality: N/A;  . ESOPHAGOGASTRODUODENOSCOPY N/A 09/04/2012   Procedure: ESOPHAGOGASTRODUODENOSCOPY (EGD);  Surgeon: Shann Medal, MD;  Location: [facility];  Service: General;  Laterality: N/A;  PF  . ESOPHAGOGASTRODUODENOSCOPY (EGD) WITH ESOPHAGEAL DILATION N/A 09/29/2012   Procedure: ESOPHAGOGASTRODUODENOSCOPY (EGD)  WITH ESOPHAGEAL DILATION;  Surgeon: Milus Banister, MD;  Location: [facility];  Service: Endoscopy;  Laterality: N/A;  . FEMORAL ARTERY EXPLORATION Right 01/17/2019   Procedure: Exploration Right Groin with Primary Closure or Arteriotomy Site; Washout of Retroperitoneal Hematoma;  Surgeon: Waynetta Sandy, MD;  Location: [facility];  Service: Vascular;  Laterality: Right;  . INSERTION OF DIALYSIS CATHETER N/A 01/03/2015   Procedure: INSERTION OF DIALYSIS CATHETER RIGHT INTERNAL JUGULAR;  Surgeon: Angelia Mould, MD;  Location: [place];  Service: Vascular;  Laterality: N/A;  . LAPAROSCOPIC GASTRIC SLEEVE RESECTION N/A 07/19/2012   Procedure: LAPAROSCOPIC SLEEVE GASTRECTOMY with EGD;  Surgeon: Madilyn Hook, DO;  Location: [facility];  Service: General;  Laterality: N/A;  laparoscopic sleeve gastrectomy with EGD  . LEFT HEART CATH AND CORONARY ANGIOGRAPHY N/A 02/25/2018   Procedure: LEFT HEART CATH AND CORONARY ANGIOGRAPHY;  Surgeon: Leonie Man, MD;  Location: [facility];  Service: Cardiovascular;  Laterality: N/A;  . LEFT HEART CATH AND CORONARY ANGIOGRAPHY N/A 06/20/2018   Procedure: LEFT HEART CATH AND CORONARY ANGIOGRAPHY;  Surgeon: Nelva Bush, MD;  Location: [facility];  Service: Cardiovascular;  Laterality: N/A;  . LEFT HEART CATH AND CORONARY ANGIOGRAPHY N/A 01/16/2019   Procedure: LEFT HEART CATH AND CORONARY ANGIOGRAPHY;  Surgeon: Jettie Booze, MD;  Location: [facility];  Service: Cardiovascular;  Laterality: N/A;  . PERIPHERAL VASCULAR CATHETERIZATION Left 05/23/2015   Procedure: Nolon Stalls;  Surgeon: Conrad Shelbyville, MD;  Location: [place];  Service: Cardiovascular;  Laterality: Left;  upper aRM  . PERIPHERAL VASCULAR CATHETERIZATION Left 05/23/2015   Procedure: Peripheral Vascular Balloon Angioplasty;  Surgeon: Conrad Alger, MD;  Location: [facility];  Service: Cardiovascular;  Laterality: Left;  av fistula  . TUBAL LIGATION  1993   . UPPER GI ENDOSCOPY N/A 07/19/2012   Procedure: UPPER GI ENDOSCOPY;  Surgeon: Madilyn Hook, DO;  [facility];  Service: General;  Laterality: N/A;    Allergies  Allergies  Allergen Reactions  . Amlodipine Swelling  . Atorvastatin     Elevated LFT's  . Clonidine Derivatives Swelling    Limbs swell  . Doxycycline Nausea And Vomiting    "I threw up for 3 hours"  . Welchol [Colesevelam Hcl] Nausea Only    History of Present Illness    Ms. Brenda Davis has a past medical history of NSTEMI with cardiac catheterization on 01/17/2019.  She had PCI with DES x2 to the proximalRCA and first marginal.  Her mid LAD was also 75% stenosed, this will be managed medically.  She also had 40% mid LAD, mid circumflex and distal circumflex lesions 55 and 40% stenosed.  She also has dyslipidemia, morbid obesity, history of acute respiratory failure with hypoxemia, HTN, ESRD on PD and history of hyperkalemia.  She presented to the hospital emergency department on 01/15/2019 for an evaluation of her chest pain.  She had reported that for the prior 2 weeks she been having fatigue.  Over that Thursday and Friday she also noticed decrease exercise tolerance her chest pain was nitro responsive.  She noticed that her chest pain was absent on Saturday and reappeared on Sunday.  She stated that her chest pain further progressed to substernal and left-sided chest pain with a stabbing type sensation.  She also had associated nausea diaphoresis, and shortness of breath.  She again took nitroglycerin x1 and her pain became worse.  She took another nitroglycerin and her pain continued to be 10/10.  Her husband called 31, she took 4 baby aspirin and a third nitroglycerin which helped her pain decrease.  Her presentation was similar to her presentation 06/2018.  Her LHC at that time showed no clear culprit lesions and she did not tolerate isosorbide.  She felt that it made her chest pain worse, she also developed  diaphoresis with the medication.  She presents to the clinic today and states she was not able to tolerate her metoprolol tartrate due to low blood pressures.  She is also decreased the amount of fluid she pulls with each dialysis session to 500 mL nightly.  Her blood pressure today is 88/64.  She states she is been sedentary since her cardiac catheterization due to her incision with staples.  She does light walking in her house and some housework.  She has questions about her diet and her husband states they have been eating packaged/processed foods such as canned spaghetti O's.  She has an appointment with nephrology on 02/02/2019.  I will give him salty 6 she, refill her Brilinta and Crestor, and asked her to increase her physical activity as tolerated.  She denies chest pain, shortness of breath, lower extremity edema, fatigue, palpitations, melena, hematuria, hemoptysis, diaphoresis, weakness, syncope, orthopnea, and PND.   Home Medications    Prior to Admission medications   Medication Sig Start Date End Date Taking? Authorizing Provider  aspirin EC 81 MG EC tablet Take 1 tablet (81 mg total) by mouth daily. 02/26/18   Norval Morton, MD  calcium acetate (  PHOSLO) 667 MG capsule Take 667 mg by mouth 3 (three) times daily with meals. 06/02/18   [provider]  cinacalcet (SENSIPAR) 90 MG tablet Take 90 mg by mouth at bedtime. 12/08/18   [provider]  Cyanocobalamin (B-12 PO) Take 1 tablet by mouth daily.    [provider]  ferric citrate (AURYXIA) 1 GM 210 MG(Fe) tablet Take 2 tablets (420 mg total) by mouth 3 (three) times daily with meals. 01/23/19   Daune Perch, NP  folic acid (FOLVITE) 1 MG tablet Take 1 tablet (1 mg total) by mouth daily. 03/08/18   Brenda Lima, MD  gabapentin (NEURONTIN) 100 MG capsule Take 100 mg by mouth 2 (two) times daily. 10/31/18   [provider]  gentamicin cream (GARAMYCIN) 0.1 % Apply 1 application topically See admin  instructions. Apply to exit site daily as directed. 04/26/17   [provider]  metFORMIN (GLUCOPHAGE) 500 MG tablet Take 1 tablet (500 mg total) by mouth 2 (two) times daily with a meal. 01/23/19 01/23/20  Daune Perch, NP  metoprolol tartrate (LOPRESSOR) 25 MG tablet Take 0.5 tablets (12.5 mg total) by mouth 2 (two) times daily. Do not take if blood pressure less than 90 on top or pulse less than 60. 01/23/19 07/22/19  Daune Perch, NP  nitroGLYCERIN (NITROSTAT) 0.4 MG SL tablet PLACE 1 TABLET (0.4 MG TOTAL) UNDER THE TONGUE EVERY 5 (FIVE) MINUTES AS NEEDED FOR CHEST PAIN. 08/01/18   Brenda Latch, MD  ondansetron (ZOFRAN ODT) 8 MG disintegrating tablet Take 1 tablet (8 mg total) by mouth every 8 (eight) hours as needed for nausea or vomiting. Patient not taking: Reported on 01/15/2019 04/09/18   Jola Schmidt, MD  rosuvastatin (CRESTOR) 10 MG tablet Take 1 tablet (10 mg total) by mouth daily. 12/12/18 03/12/19  Brenda Latch, MD  ticagrelor (BRILINTA) 90 MG TABS tablet Take 1 tablet (90 mg total) by mouth 2 (two) times daily. 01/23/19 01/18/20  Daune Perch, NP    Family History    Family History  Problem Relation Age of Onset  . Hypertension Mother   . Mitral valve prolapse Mother   . Diabetes Father   . Arthritis Other   . Hyperlipidemia Other   . Cancer Neg Hx   . Heart disease Neg Hx   . Kidney disease Neg Hx   . Stroke Neg Hx    She indicated that her mother is alive. She indicated that her father is deceased. She indicated that the status of her neg hx is unknown. She indicated that the status of her other is unknown.  Social History    Social History   Socioeconomic History  . Marital status: Married    Spouse name: Not on file  . Number of children: 3  . Years of education: Not on file  . Highest education level: Not on file  Occupational History  . Occupation: Research scientist (physical sciences): [employer]  Tobacco Use  . Smoking status: Former Smoker    Packs/day:  1.00    Years: 16.00    Pack years: 16.00    Types: Cigarettes    Quit date: 2009    Years since quitting: 12.0  . Smokeless tobacco: Never Used  Substance and Sexual Activity  . Alcohol use: Never  . Drug use: No  . Sexual activity: Yes    Birth control/protection: Post-menopausal  Other Topics Concern  . Not on file  Social History Narrative  . Not on file  Social Determinants of Health   Financial Resource Strain:   . Difficulty of Paying Living Expenses: Not on file  Food Insecurity:   . Worried About Charity fundraiser in the Last Year: Not on file  . Ran Out of Food in the Last Year: Not on file  Transportation Needs:   . Lack of Transportation (Medical): Not on file  . Lack of Transportation (Non-Medical): Not on file  Physical Activity:   . Days of Exercise per Week: Not on file  . Minutes of Exercise per Session: Not on file  Stress:   . Feeling of Stress : Not on file  Social Connections:   . Frequency of Communication with Friends and Family: Not on file  . Frequency of Social Gatherings with Friends and Family: Not on file  . Attends Religious Services: Not on file  . Active Member of Clubs or Organizations: Not on file  . Attends Archivist Meetings: Not on file  . Marital Status: Not on file  Intimate Partner Violence:   . Fear of Current or Ex-Partner: Not on file  . Emotionally Abused: Not on file  . Physically Abused: Not on file  . Sexually Abused: Not on file     Review of Systems    General:  No chills, fever, night sweats or weight changes.  Cardiovascular:  No chest pain, dyspnea on exertion, edema, orthopnea, palpitations, paroxysmal nocturnal dyspnea. Dermatological: No rash, lesions/masses Respiratory: No cough, dyspnea Urologic: No hematuria, dysuria Abdominal:   No nausea, vomiting, diarrhea, bright red blood per rectum, melena, or hematemesis Neurologic:  No visual changes, wkns, changes in mental status. All other systems  reviewed and are otherwise negative except as noted above.  Physical Exam    VS:  BP (!) 88/64   Pulse 89   Ht 5' 5"  (1.651 m)   Wt 291 lb (132 kg)   BMI 48.42 kg/m  , BMI Body mass index is 48.42 kg/m. GEN: Well nourished, well developed, in no acute distress. HEENT: normal. Neck: Supple, no JVD, carotid bruits, or masses. Cardiac: RRR, no murmurs, rubs, or gallops. No clubbing, cyanosis, edema.  Radials/DP/PT 2+ and equal bilaterally.  Respiratory:  Respirations regular and unlabored, clear to auscultation bilaterally. GI: Soft, nontender, nondistended, BS + x 4. MS: no deformity or atrophy. Skin: warm and dry, no rash.  Right groin site clean dry, staples intact, no erythema, no drainage. Neuro:  Strength and sensation are intact. Psych: Normal affect.  Accessory Clinical Findings    ECG personally reviewed by me today-normal sinus rhythm 89 bpm- No acute changes  EKG 01/18/2019 Normal sinus rhythm 91 bpm  Cardiac catheterization 01/17/2019  Mid LAD lesion is 75% stenosed. The LAD was not addressed and will be managed medically.  Ost LAD to Mid LAD lesion is 40% stenosed.  Mid Cx to Dist Cx lesion is 55% stenosed with 40% stenosed side branch in Ost 3rd Mrg.  Prox RCA lesion is 75% stenosed.  A drug-eluting stent was successfully placed using a SYNERGY XD 3.0X38, postdilated to 3.5 mm.  Post intervention, there is a 0% residual stenosis.  Ost 1st Mrg lesion is 99% stenosed. EBU4 Guide catheter used.  A drug-eluting stent was successfully placed using a SYNERGY XD 2.25X24, postdilated to 2.75 mm.  Post intervention, there is a 0% residual stenosis.   Continue DAPT for 12 months.  Would recommend clopidogrel long-term after one year since she has such diffuse disease.  Continue aggressive secondary prevention.  Echocardiogram 01/16/2019 IMPRESSIONS 1. Left ventricular ejection fraction, by visual estimation, is 65 to 70%. The left ventricle has hyperdynamic  function. There is moderately increased left ventricular hypertrophy. 2. Definity contrast agent was given IV to delineate the left ventricular endocardial borders. 3. Left ventricular diastolic parameters are consistent with Grade I diastolic dysfunction (impaired relaxation). 4. The left ventricle has no regional wall motion abnormalities. 5. Global right ventricle has normal systolic function.The right ventricular size is mildly enlarged. No increase in right ventricular wall thickness. 6. Left atrial size was normal. 7. Right atrial size was normal. 8. The pericardial effusion is posterior to the left ventricle. 9. Trivial pericardial effusion is present. 10. The mitral valve was not well visualized. Trivial mitral valve regurgitation. 11. The tricuspid valve is not well visualized. 12. The aortic valve is tricuspid. Aortic valve regurgitation is not visualized. Mild aortic valve sclerosis without stenosis. 13. The pulmonic valve was grossly normal. Pulmonic valve regurgitation is not visualized. 14. The inferior vena cava is normal in size with <50% respiratory variability, suggesting right atrial pressure of 8 mmHg. 15. The interatrial septum was not well visualized.  Assessment & Plan   1.  NSTEMI-no chest pain today.  Right groin site clean, dry, no drainage.  Staples intact.  Vascular will remove staples in 1 week per patient. Continue aspirin 81 mg daily Brilinta 90 mg twice daily Hold metoprolol tartrate 25 mg twice daily-hypotension Nitroglycerin 0.4 mg sublingual as needed Rosuvastatin 10 mg tablet daily Heart healthy low-sodium-salty 6 given/heart healthy diet information Increase physical activity as tolerated  Essential hypertension-BP today 88/64.  Patient has reduced amount of fluid pulled with dialysis to 500 mL from around 2 L.  Has appointment with nephrology 02/02/2019 Hold metoprolol titrate 25 mg tablet daily Heart healthy low-sodium diet Increase physical  activity as tolerated  Hyperlipidemia-01/16/2019: Cholesterol 185; HDL 39; LDL Cholesterol 116; Triglycerides 150; VLDL 30 Continue rosuvastatin 10 mg tablet daily Heart healthy low-sodium high-fiber diet Increase physical activity as tolerated Repeat lipids and LFTs in 3 months   End-stage renal disease-on peritoneal dialysis.  Creatinine 13.06, BUN 57 01/22/2019 Monitored by nephrology-appointment 02/02/2019  Follow-up with Dr. Oval Linsey in 3 months.  Jossie Ng. [facility] [address] Office [phone] Fax [phone]

## 2019-02-01 ENCOUNTER — Other Ambulatory Visit: Payer: Self-pay

## 2019-02-01 ENCOUNTER — Encounter: Payer: Self-pay | Admitting: General Practice

## 2019-02-01 ENCOUNTER — Ambulatory Visit (INDEPENDENT_AMBULATORY_CARE_PROVIDER_SITE_OTHER): Payer: BC Managed Care – PPO | Admitting: General Practice

## 2019-02-01 VITALS — BP 88/64 | HR 89 | Ht 65.0 in | Wt 291.0 lb

## 2019-02-01 DIAGNOSIS — I214 Non-ST elevation (NSTEMI) myocardial infarction: Secondary | ICD-10-CM

## 2019-02-01 DIAGNOSIS — E785 Hyperlipidemia, unspecified: Secondary | ICD-10-CM | POA: Diagnosis not present

## 2019-02-01 DIAGNOSIS — N186 End stage renal disease: Secondary | ICD-10-CM

## 2019-02-01 DIAGNOSIS — Z992 Dependence on renal dialysis: Secondary | ICD-10-CM

## 2019-02-01 DIAGNOSIS — I10 Essential (primary) hypertension: Secondary | ICD-10-CM

## 2019-02-01 MED ORDER — TICAGRELOR 90 MG PO TABS: 90.0000 mg | ORAL_TABLET | Freq: Two times a day (BID) | ORAL | 3 refills | Status: DC

## 2019-02-01 MED ORDER — ROSUVASTATIN CALCIUM 10 MG PO TABS: 10.0000 mg | ORAL_TABLET | Freq: Every day | ORAL | 3 refills | Status: DC

## 2019-02-01 NOTE — Patient Instructions (Signed)
Medication Instructions:   OK to STOP Metoprolol Tartrate  *If you need a refill on your cardiac medications before your next appointment, please call your pharmacy*   Follow-Up: At [facility], you and your health needs are our priority.  As part of our continuing mission to provide you with exceptional heart care, we have created designated Provider Care Teams.  These Care Teams include your primary Cardiologist (physician) and Advanced Practice Providers (APPs -  Physician Assistants and Nurse Practitioners) who all work together to provide you with the care you need, when you need it.  Your next appointment:   3 month(s)  The format for your next appointment:   In Person  Provider:   You may see Skeet Latch, MD or one of the following Advanced Practice Providers on your designated Care Team:    Kerin Ransom, PA-C  [place], [place]  Coletta Memos, [place]   Other Instructions:  Increase physical activity as tolerated.   Heart-Healthy Eating Plan Heart-healthy meal planning includes:  Eating less unhealthy fats.  Eating more healthy fats.  Making other changes in your diet. Talk with your doctor or a diet specialist (dietitian) to create an eating plan that is right for you. What is my plan? Your doctor may recommend an eating plan that includes:  Total fat: ______% or less of total calories a day.  Saturated fat: ______% or less of total calories a day.  Cholesterol: less than _________mg a day. What are tips for following this plan? Cooking Avoid frying your food. Try to bake, boil, grill, or broil it instead. You can also reduce fat by:  Removing the skin from poultry.  Removing all visible fats from meats.  Steaming vegetables in water or broth. Meal planning   At meals, divide your plate into four equal parts: ? Fill one-half of your plate with vegetables and green salads. ? Fill one-fourth of your plate with whole grains. ? Fill  one-fourth of your plate with lean protein foods.  Eat 4-5 servings of vegetables per day. A serving of vegetables is: ? 1 cup of raw or cooked vegetables. ? 2 cups of raw leafy greens.  Eat 4-5 servings of fruit per day. A serving of fruit is: ? 1 medium whole fruit. ?  cup of dried fruit. ?  cup of fresh, frozen, or canned fruit. ?  cup of 100% fruit juice.  Eat more foods that have soluble fiber. These are apples, broccoli, carrots, beans, peas, and barley. Try to get 20-30 g of fiber per day.  Eat 4-5 servings of nuts, legumes, and seeds per week: ? 1 serving of dried beans or legumes equals  cup after being cooked. ? 1 serving of nuts is  cup. ? 1 serving of seeds equals 1 tablespoon. General information  Eat more home-cooked food. Eat less restaurant, buffet, and fast food.  Limit or avoid alcohol.  Limit foods that are high in starch and sugar.  Avoid fried foods.  Lose weight if you are overweight.  Keep track of how much salt (sodium) you eat. This is important if you have high blood pressure. Ask your doctor to tell you more about this.  Try to add vegetarian meals each week. Fats  Choose healthy fats. These include olive oil and canola oil, flaxseeds, walnuts, almonds, and seeds.  Eat more omega-3 fats. These include salmon, mackerel, sardines, tuna, flaxseed oil, and ground flaxseeds. Try to eat fish at least 2 times each week.  Check food  labels. Avoid foods with trans fats or high amounts of saturated fat.  Limit saturated fats. ? These are often found in animal products, such as meats, butter, and cream. ? These are also found in plant foods, such as palm oil, palm kernel oil, and coconut oil.  Avoid foods with partially hydrogenated oils in them. These have trans fats. Examples are stick margarine, some tub margarines, cookies, crackers, and other baked goods. What foods can I eat? Fruits All fresh, canned (in natural juice), or frozen  fruits. Vegetables Fresh or frozen vegetables (raw, steamed, roasted, or grilled). Green salads. Grains Most grains. Choose whole wheat and whole grains most of the time. Rice and pasta, including brown rice and pastas made with whole wheat. Meats and other proteins Lean, well-trimmed beef, veal, pork, and lamb. Chicken and [place] without skin. All fish and shellfish. Wild duck, rabbit, pheasant, and venison. Egg whites or low-cholesterol egg substitutes. Dried beans, peas, lentils, and tofu. Seeds and most nuts. Dairy Low-fat or nonfat cheeses, including ricotta and mozzarella. Skim or 1% milk that is liquid, powdered, or evaporated. Buttermilk that is made with low-fat milk. Nonfat or low-fat yogurt. Fats and oils Non-hydrogenated (trans-free) margarines. Vegetable oils, including soybean, sesame, sunflower, olive, peanut, safflower, corn, canola, and cottonseed. Salad dressings or mayonnaise made with a vegetable oil. Beverages Mineral water. Coffee and tea. Diet carbonated beverages. Sweets and desserts Sherbet, gelatin, and fruit ice. Small amounts of dark chocolate. Limit all sweets and desserts. Seasonings and condiments All seasonings and condiments. The items listed above may not be a complete list of foods and drinks you can eat. Contact a dietitian for more options. What foods should I avoid? Fruits Canned fruit in heavy syrup. Fruit in cream or butter sauce. Fried fruit. Limit coconut. Vegetables Vegetables cooked in cheese, cream, or butter sauce. Fried vegetables. Grains Breads that are made with saturated or trans fats, oils, or whole milk. Croissants. Sweet rolls. Donuts. High-fat crackers, such as cheese crackers. Meats and other proteins Fatty meats, such as hot dogs, ribs, sausage, bacon, rib-eye roast or steak. High-fat deli meats, such as salami and bologna. Caviar. Domestic duck and goose. Organ meats, such as liver. Dairy Cream, sour cream, cream cheese, and  creamed cottage cheese. Whole-milk cheeses. Whole or 2% milk that is liquid, evaporated, or condensed. Whole buttermilk. Cream sauce or high-fat cheese sauce. Yogurt that is made from whole milk. Fats and oils Meat fat, or shortening. Cocoa butter, hydrogenated oils, palm oil, coconut oil, palm kernel oil. Solid fats and shortenings, including bacon fat, salt pork, lard, and butter. Nondairy cream substitutes. Salad dressings with cheese or sour cream. Beverages Regular sodas and juice drinks with added sugar. Sweets and desserts Frosting. Pudding. Cookies. Cakes. Pies. Milk chocolate or white chocolate. Buttered syrups. Full-fat ice cream or ice cream drinks. The items listed above may not be a complete list of foods and drinks to avoid. Contact a dietitian for more information. Summary  Heart-healthy meal planning includes eating less unhealthy fats, eating more healthy fats, and making other changes in your diet.  Eat a balanced diet. This includes fruits and vegetables, low-fat or nonfat dairy, lean protein, nuts and legumes, whole grains, and heart-healthy oils and fats. This information is not intended to replace advice given to you by your health care provider. Make sure you discuss any questions you have with your health care provider. Document Revised: 03/11/2017 Document Reviewed: 02/12/2017 Elsevier Patient Education  2020 [employer].

## 2019-02-02 NOTE — Telephone Encounter (Signed)
Patient was seen by Keenan Bachelor NP 1/13

## 2019-02-02 NOTE — Telephone Encounter (Signed)
Patient was seen 1/13

## 2019-02-03 ENCOUNTER — Telehealth (HOSPITAL_COMMUNITY): Payer: Self-pay

## 2019-02-03 ENCOUNTER — Ambulatory Visit: Payer: 59 | Admitting: Cardiology

## 2019-02-03 NOTE — Telephone Encounter (Signed)
Cardiac Rehab Note: Successful telephone encounter to Ms. Brenda Davis to discuss referral to Cardiac Rehab and to follow up on medical stability and readiness to exercise. Cardiology note 02/01/19 reviewed. Metoprolol discontinued secondary to hypotension.   Ms. Brenda Davis complains of feeling dizzy, lightheaded upon standing, and weak. Reports am BP 72/52. She is a peritoneal dialysis patient followed by [facility]. Ms. Brenda Davis states she took "an old midodrine 10 mg". Current BP sitting 117/78, HR 82. Husband and patient instructed on how to check for orthostatic hypotension. BP upon standing 86/75, HR 99.  Reported patient symptoms to Jenny Reichmann, RN at [facility] as last cardiology note indicates patient needs to follow up with nephrology. Cindy to notify nephrologist/patient with new recommendations. Patient is not appropriate for CR at this time. Will follow up for virtual CR appropriateness once patients BP improves.  Meiko Ives E. Laray Anger, BSN

## 2019-02-08 ENCOUNTER — Other Ambulatory Visit: Payer: Self-pay | Admitting: Nephrology

## 2019-02-09 ENCOUNTER — Other Ambulatory Visit: Payer: Self-pay | Admitting: Nephrology

## 2019-02-09 ENCOUNTER — Ambulatory Visit
Admission: RE | Admit: 2019-02-09 | Discharge: 2019-02-09 | Disposition: A | Discharge status: Home / Self Care | Payer: BC Managed Care – PPO | Source: Ambulatory Visit | Attending: Nephrology | Admitting: Nephrology

## 2019-02-09 DIAGNOSIS — R58 Hemorrhage, not elsewhere classified: Secondary | ICD-10-CM

## 2019-02-11 ENCOUNTER — Encounter (HOSPITAL_COMMUNITY): Payer: Self-pay | Admitting: Emergency Medicine

## 2019-02-11 ENCOUNTER — Other Ambulatory Visit: Payer: Self-pay

## 2019-02-11 ENCOUNTER — Emergency Department (HOSPITAL_COMMUNITY): Payer: BC Managed Care – PPO

## 2019-02-11 ENCOUNTER — Emergency Department (HOSPITAL_COMMUNITY)
Admission: EM | Admit: 2019-02-11 | Discharge: 2019-02-12 | Disposition: A | Discharge status: Home / Self Care | Payer: BC Managed Care – PPO | Attending: Emergency Medicine | Admitting: Emergency Medicine

## 2019-02-11 DIAGNOSIS — Z7982 Long term (current) use of aspirin: Secondary | ICD-10-CM | POA: Diagnosis not present

## 2019-02-11 DIAGNOSIS — N186 End stage renal disease: Secondary | ICD-10-CM | POA: Diagnosis not present

## 2019-02-11 DIAGNOSIS — Z87891 Personal history of nicotine dependence: Secondary | ICD-10-CM | POA: Diagnosis not present

## 2019-02-11 DIAGNOSIS — E1122 Type 2 diabetes mellitus with diabetic chronic kidney disease: Secondary | ICD-10-CM | POA: Diagnosis not present

## 2019-02-11 DIAGNOSIS — I120 Hypertensive chronic kidney disease with stage 5 chronic kidney disease or end stage renal disease: Secondary | ICD-10-CM | POA: Insufficient documentation

## 2019-02-11 DIAGNOSIS — Z992 Dependence on renal dialysis: Secondary | ICD-10-CM | POA: Diagnosis not present

## 2019-02-11 DIAGNOSIS — R072 Precordial pain: Secondary | ICD-10-CM

## 2019-02-11 DIAGNOSIS — Z7984 Long term (current) use of oral hypoglycemic drugs: Secondary | ICD-10-CM | POA: Insufficient documentation

## 2019-02-11 DIAGNOSIS — I2510 Atherosclerotic heart disease of native coronary artery without angina pectoris: Secondary | ICD-10-CM | POA: Insufficient documentation

## 2019-02-11 LAB — TROPONIN I (HIGH SENSITIVITY): Troponin I (High Sensitivity): 11 ng/L (ref <18)

## 2019-02-11 LAB — BASIC METABOLIC PANEL
Anion gap: 16 — ABNORMAL HIGH (ref 5–15)
BUN: 56 mg/dL — ABNORMAL HIGH (ref 6–20)
CO2: 23 mmol/L (ref 22–32)
Calcium: 8 mg/dL — ABNORMAL LOW (ref 8.9–10.3)
Chloride: 94 mmol/L — ABNORMAL LOW (ref 98–111)
Creatinine, Ser: 15.76 mg/dL — ABNORMAL HIGH (ref 0.44–1.00)
GFR calc Af Amer: 3 mL/min — ABNORMAL LOW (ref >60)
GFR calc non Af Amer: 2 mL/min — ABNORMAL LOW (ref >60)
Glucose, Bld: 139 mg/dL — ABNORMAL HIGH (ref 70–99)
Potassium: 3.4 mmol/L — ABNORMAL LOW (ref 3.5–5.1)
Sodium: 133 mmol/L — ABNORMAL LOW (ref 135–145)

## 2019-02-11 LAB — CBC
HCT: 31.7 % — ABNORMAL LOW (ref 36.0–46.0)
Hemoglobin: 10.5 g/dL — ABNORMAL LOW (ref 12.0–15.0)
MCH: 31.8 pg (ref 26.0–34.0)
MCHC: 33.1 g/dL (ref 30.0–36.0)
MCV: 96.1 fL (ref 80.0–100.0)
Platelets: 466 10*3/uL — ABNORMAL HIGH (ref 150–400)
RBC: 3.3 MIL/uL — ABNORMAL LOW (ref 3.87–5.11)
RDW: 16.4 % — ABNORMAL HIGH (ref 11.5–15.5)
WBC: 9.8 10*3/uL (ref 4.0–10.5)
nRBC: 0 % (ref 0.0–0.2)

## 2019-02-11 MED ORDER — SODIUM CHLORIDE 0.9% FLUSH: 3.0000 mL | Freq: Once | INTRAVENOUS | Status: DC

## 2019-02-11 NOTE — ED Triage Notes (Signed)
Per EMS. Pt from home, began having substernal dull chest pain "like the last time."  Pt had two stints placed on December 29th of 2020.  Pt did take 324 ASA and two nitros which decreased her pain from a 8 to a 0 as well as dropped her blood pressure.  Pt is a dialysis pt, nightly at home, has not begun her dialysis tonight.  No pain now however she did get dizzy when she stood earlier.

## 2019-02-11 NOTE — ED Notes (Signed)
Pt to Xray.  Reports no pain at this time.  Pt was able to notify daughter of arrival and updated her to her current status.

## 2019-02-11 NOTE — ED Provider Notes (Signed)
[facility] EMERGENCY DEPARTMENT Provider Note   CSN: [id number] Arrival date & time: 02/11/19  2112     History Chief Complaint  Patient presents with   Chest Pain    Brenda Davis is a 54 y.o. female.  Patient with hx cad, esrd/hd, c/o mid chest pain at rest tonight. Acute onset dull, midline/midsternal cp, moderate, non radiating, not pleuritic. No associated sob, nv or diaphoresis. Unsure of similar to prior cardiac cp. Took at ntg with no immediate change. Denies any other recent exertional cp or discomfort. No unusual doe or fatigue. No cough or uri symptoms. No fever or chills. No heartburn. Denies back or flank pain. No abd pain or nv. No leg pain or swelling. States compliant w home meds including asa and brilliants.   The history is provided by the patient.  Chest Pain Associated symptoms: no abdominal pain, no back pain, no cough, no fever, no headache, no nausea, no palpitations, no shortness of breath and no vomiting        Past Medical History:  Diagnosis Date   Anemia    CAD (coronary artery disease)    a. cath in 02/2018 showing moderate 3-vessel CAD with 45% mid-LADm 55% LCx and 65% RCA stenosis which was not significant by FFR   ESRD on dialysis ([place])    Gout    HCAP (healthcare-associated pneumonia) 05/06/2016   History of blood transfusion    "related to surgery"   Hyperlipidemia    Hypertension    Morbid obesity ([place])    Pain    LEFT SHOULDER PAIN - WAS SEEN AT AN URGENT CARE - GIVEN SLING FOR COMFORT AND TOLD ROM AS TOLERATED.   Palpitations 09/24/2016   Peritonitis, dialysis-associated ([place])    Type II diabetes mellitus ([place])    "gastric sleeve OR corrected this" (05/06/2016)    Patient Active Problem List   Diagnosis Date Noted   Retroperitoneal hemorrhage - post Cath/PCI - taken to OR for drainage & CFA repair 01/18/2019   Acute blood loss anemia - resolved, transfused in OR - Hgb stable 01/18/2019     Increased anion gap metabolic acidosis 58/52/7782   Shock circulatory ([place]) - secondary to acute blood loss anemia, elevated WBC (? sepsis vs post-op inflammation).h 01/18/2019   Postprocedural hypotension    Hyperkalemia    Dysuria 11/25/2018   Coronary artery disease involving native coronary artery of native heart with unstable angina pectoris ([place])    Hypocalcemia 06/18/2018   Chest pain, rule out acute myocardial infarction 06/18/2018   Localized swelling of finger of left hand 06/01/2018   Sepsis ([place]) 05/23/2018   C. difficile diarrhea 05/08/2018   Diarrhea of infectious origin 05/04/2018   Hemodialysis-associated hypotension 04/28/2018   Elevated troponin 03/08/2018   Folate deficiency 03/08/2018   NSTEMI (non-ST elevated myocardial infarction) ([place])    CAD in native artery    Hepatic cirrhosis ([place]) 11/24/2017   ESRD on peritoneal dialysis ([place]) 08/25/2017   DM (diabetes mellitus), type 2 with renal complications ([place]) 42/35/3614   Chronic heart failure with preserved ejection fraction ([place]) 06/22/2017   Trigger finger, left middle finger 09/14/2016   Sensorineural hearing loss (SNHL), bilateral 06/29/2016   Allergic rhinitis 06/27/2016   Severe persistent asthma with exacerbation 05/12/2016   Acute respiratory failure with hypoxia and hypercapnia ([place]) 05/06/2016   Anemia of chronic disease 05/06/2016   Routine general medical examination at a health care facility 12/05/2015   Cervical cancer screening 12/05/2015   Visit  for screening mammogram 01/23/2015   Gout due to renal impairment 09/13/2014   Thiamine deficiency 12/02/2012   Hypokalemia 11/28/2012   S/P laparoscopic sleeve gastrectomy 11/28/2012   B12 deficiency anemia 11/28/2012   Morbid obesity with BMI of 40.0-44.9, adult ([place]) 07/22/2012   Dyslipidemia, goal LDL below 70 04/09/2008   CKD (chronic kidney disease) stage V requiring chronic dialysis (MWF) 02/08/2008     Past Surgical History:  Procedure Laterality Date   AV FISTULA PLACEMENT Left 01/03/2015   Procedure: BRACHIAL CEPHALIC ARTERIOVENOUS  FISTULA CREATION LEFT ARM;  Surgeon: Angelia Mould, MD;  Location: [place];  Service: Vascular;  Laterality: Left;   CARPAL TUNNEL RELEASE Right    [place]  09/14/2017   CORONARY STENT INTERVENTION N/A 01/17/2019   Procedure: CORONARY STENT INTERVENTION;  Surgeon: Jettie Booze, MD;  Location: [place] [facility];  Service: Cardiovascular;  Laterality: N/A;   ESOPHAGOGASTRODUODENOSCOPY N/A 09/04/2012   Procedure: ESOPHAGOGASTRODUODENOSCOPY (EGD);  Surgeon: Shann Medal, MD;  Location: [facility];  Service: General;  Laterality: N/A;  PF   ESOPHAGOGASTRODUODENOSCOPY (EGD) WITH ESOPHAGEAL DILATION N/A 09/29/2012   Procedure: ESOPHAGOGASTRODUODENOSCOPY (EGD) WITH ESOPHAGEAL DILATION;  Surgeon: Milus Banister, MD;  Location: [facility];  Service: Endoscopy;  Laterality: N/A;   FEMORAL ARTERY EXPLORATION Right 01/17/2019   Procedure: Exploration Right Groin with Primary Closure or Arteriotomy Site; Washout of Retroperitoneal Hematoma;  Surgeon: Waynetta Sandy, MD;  Location: [place];  Service: Vascular;  Laterality: Right;   INSERTION OF DIALYSIS CATHETER N/A 01/03/2015   Procedure: INSERTION OF DIALYSIS CATHETER RIGHT INTERNAL JUGULAR;  Surgeon: Angelia Mould, MD;  Location: [place];  Service: Vascular;  Laterality: N/A;   LAPAROSCOPIC GASTRIC SLEEVE RESECTION N/A 07/19/2012   Procedure: LAPAROSCOPIC SLEEVE GASTRECTOMY with EGD;  Surgeon: Madilyn Hook, DO;  Location: [facility];  Service: General;  Laterality: N/A;  laparoscopic sleeve gastrectomy with EGD   LEFT HEART CATH AND CORONARY ANGIOGRAPHY N/A 02/25/2018   Procedure: LEFT HEART CATH AND CORONARY ANGIOGRAPHY;  Surgeon: Leonie Man, MD;  Location: [place] [facility];  Service: Cardiovascular;  Laterality: N/A;   LEFT HEART  CATH AND CORONARY ANGIOGRAPHY N/A 06/20/2018   Procedure: LEFT HEART CATH AND CORONARY ANGIOGRAPHY;  Surgeon: Nelva Bush, MD;  Location: [place] [facility];  Service: Cardiovascular;  Laterality: N/A;   LEFT HEART CATH AND CORONARY ANGIOGRAPHY N/A 01/16/2019   Procedure: LEFT HEART CATH AND CORONARY ANGIOGRAPHY;  Surgeon: Jettie Booze, MD;  Location: [place] [facility];  Service: Cardiovascular;  Laterality: N/A;   PERIPHERAL VASCULAR CATHETERIZATION Left 05/23/2015   Procedure: Nolon Stalls;  Surgeon: Conrad Dwale, MD;  Location: [place] [facility];  Service: Cardiovascular;  Laterality: Left;  upper aRM   PERIPHERAL VASCULAR CATHETERIZATION Left 05/23/2015   Procedure: Peripheral Vascular Balloon Angioplasty;  Surgeon: Conrad , MD;  Location: [place] [facility];  Service: Cardiovascular;  Laterality: Left;  av fistula   TUBAL LIGATION  1993   UPPER GI ENDOSCOPY N/A 07/19/2012   Procedure: UPPER GI ENDOSCOPY;  Surgeon: Madilyn Hook, DO;  [facility];  Service: General;  Laterality: N/A;     OB History   No obstetric history on file.     Family History  Problem Relation Age of Onset   Hypertension Mother    Mitral valve prolapse Mother    Diabetes Father    Arthritis Other    Hyperlipidemia Other    Cancer Neg  Hx    Heart disease Neg Hx    Kidney disease Neg Hx    Stroke Neg Hx     Social History   Tobacco Use   Smoking status: Former Smoker    Packs/day: 1.00    Years: 16.00    Pack years: 16.00    Types: Cigarettes    Quit date: 2009    Years since quitting: 12.0   Smokeless tobacco: Never Used  Substance Use Topics   Alcohol use: Never   Drug use: No    Home Medications Prior to Admission medications   Medication Sig Start Date End Date Taking? Authorizing Provider  aspirin EC 81 MG EC tablet Take 1 tablet (81 mg total) by mouth daily. 02/26/18  Yes Smith, Rondell A, MD  calcium acetate (PHOSLO) 667 MG capsule Take 667 mg by mouth  3 (three) times daily with meals. 06/02/18  Yes [provider]  cinacalcet (SENSIPAR) 90 MG tablet Take 90 mg by mouth at bedtime. 12/08/18  Yes [provider]  Cyanocobalamin (B-12 PO) Take 1 tablet by mouth daily.   Yes [provider]  ferric citrate (AURYXIA) 1 GM 210 MG(Fe) tablet Take 2 tablets (420 mg total) by mouth 3 (three) times daily with meals. 01/23/19  Yes Daune Perch, NP  folic acid (FOLVITE) 1 MG tablet Take 1 tablet (1 mg total) by mouth daily. 03/08/18  Yes [name], MD  gabapentin (NEURONTIN) 100 MG capsule Take 100 mg by mouth 2 (two) times daily. 10/31/18  Yes [provider]  gentamicin cream (GARAMYCIN) 0.1 % Apply 1 application topically See admin instructions. Apply to exit site daily as directed. 04/26/17  Yes [provider]  metFORMIN (GLUCOPHAGE) 500 MG tablet Take 1 tablet (500 mg total) by mouth 2 (two) times daily with a meal. 01/23/19 01/23/20 Yes Daune Perch, NP  nitroGLYCERIN (NITROSTAT) 0.4 MG SL tablet PLACE 1 TABLET (0.4 MG TOTAL) UNDER THE TONGUE EVERY 5 (FIVE) MINUTES AS NEEDED FOR CHEST PAIN. 08/01/18  Yes Skeet Latch, MD  rosuvastatin (CRESTOR) 10 MG tablet Take 1 tablet (10 mg total) by mouth daily. 02/01/19 05/02/19 Yes Cleaver, Jossie Ng, NP  ticagrelor (BRILINTA) 90 MG TABS tablet Take 1 tablet (90 mg total) by mouth 2 (two) times daily. 02/01/19 01/27/20 Yes Cleaver, Jossie Ng, NP  ondansetron (ZOFRAN ODT) 8 MG disintegrating tablet Take 1 tablet (8 mg total) by mouth every 8 (eight) hours as needed for nausea or vomiting. Patient not taking: Reported on 02/11/2019 04/09/18   Jola Schmidt, MD    Allergies    Amlodipine, Atorvastatin, Clonidine derivatives, Doxycycline, and Welchol [colesevelam hcl]  Review of Systems   Review of Systems  Constitutional: Negative for fever.  HENT: Negative for sore throat.   Eyes: Negative for redness.  Respiratory: Negative for cough and shortness of breath.    Cardiovascular: Positive for chest pain. Negative for palpitations and leg swelling.  Gastrointestinal: Negative for abdominal pain, nausea and vomiting.  Genitourinary: Negative for flank pain.  Musculoskeletal: Negative for back pain and neck pain.  Skin: Negative for rash.  Neurological: Negative for headaches.  Hematological: Does not bruise/bleed easily.  Psychiatric/Behavioral: Negative for confusion.    Physical Exam Updated Vital Signs BP 122/72 (BP Location: Right Arm)    Pulse 87    Temp 98.4 F (36.9 C) (Oral)    Resp 18    Ht 1.651 m (5' 5" )    Wt 127.9 kg    SpO2 98%  BMI 46.93 kg/m   Physical Exam Vitals and nursing note reviewed.  Constitutional:      Appearance: Normal appearance. She is well-developed.  HENT:     Head: Atraumatic.     Nose: Nose normal.     Mouth/Throat:     Mouth: Mucous membranes are moist.  Eyes:     General: No scleral icterus.    Conjunctiva/sclera: Conjunctivae normal.  Neck:     Trachea: No tracheal deviation.  Cardiovascular:     Rate and Rhythm: Normal rate and regular rhythm.     Pulses: Normal pulses.     Heart sounds: Normal heart sounds. No murmur. No friction rub. No gallop.   Pulmonary:     Effort: Pulmonary effort is normal. No respiratory distress.     Breath sounds: Normal breath sounds.  Chest:     Chest wall: Tenderness present.  Abdominal:     General: Bowel sounds are normal. There is no distension.     Palpations: Abdomen is soft.     Tenderness: There is no abdominal tenderness. There is no guarding.  Genitourinary:    Comments: No cva tenderness.  Musculoskeletal:        General: No swelling or tenderness.     Cervical back: Normal range of motion and neck supple. No rigidity. No muscular tenderness.     Right lower leg: No edema.     Left lower leg: No edema.  Skin:    General: Skin is warm and dry.     Findings: No rash.  Neurological:     Mental Status: She is alert.     Comments: Alert, speech  normal.   Psychiatric:        Mood and Affect: Mood normal.     ED Results / Procedures / Treatments   Labs (all labs ordered are listed, but only abnormal results are displayed) Results for orders placed or performed during the hospital encounter of 02/72/53  Basic metabolic panel  Result Value Ref Range   Sodium 133 (L) 135 - 145 mmol/L   Potassium 3.4 (L) 3.5 - 5.1 mmol/L   Chloride 94 (L) 98 - 111 mmol/L   CO2 23 22 - 32 mmol/L   Glucose, Bld 139 (H) 70 - 99 mg/dL   BUN 56 (H) 6 - 20 mg/dL   Creatinine, Ser 15.76 (H) 0.44 - 1.00 mg/dL   Calcium 8.0 (L) 8.9 - 10.3 mg/dL   GFR calc non Af Amer 2 (L) >60 mL/min   GFR calc Af Amer 3 (L) >60 mL/min   Anion gap 16 (H) 5 - 15  CBC  Result Value Ref Range   WBC 9.8 4.0 - 10.5 K/uL   RBC 3.30 (L) 3.87 - 5.11 MIL/uL   Hemoglobin 10.5 (L) 12.0 - 15.0 g/dL   HCT 31.7 (L) 36.0 - 46.0 %   MCV 96.1 80.0 - 100.0 fL   MCH 31.8 26.0 - 34.0 pg   MCHC 33.1 30.0 - 36.0 g/dL   RDW 16.4 (H) 11.5 - 15.5 %   Platelets 466 (H) 150 - 400 K/uL   nRBC 0.0 0.0 - 0.2 %  Troponin I (High Sensitivity)  Result Value Ref Range   Troponin I (High Sensitivity) 11 <18 ng/L   CT ABDOMEN PELVIS WO CONTRAST  Result Date: 01/20/2019 CLINICAL DATA:  Follow-up retroperitoneal hematoma EXAM: CT ABDOMEN AND PELVIS WITHOUT CONTRAST TECHNIQUE: Multidetector CT imaging of the abdomen and pelvis was performed following the standard protocol without IV  contrast. Oral enteric contrast was administered. COMPARISON:  01/17/2019 FINDINGS: Lower chest: Dependent bibasilar atelectasis or consolidation, similar to prior examination. Three-vessel coronary artery calcifications and/or stents. Hepatobiliary: No focal liver abnormality is seen. Status post cholecystectomy. No biliary dilatation. Pancreas: Unremarkable. No pancreatic ductal dilatation or surrounding inflammatory changes. Spleen: Normal in size without significant abnormality. Adrenals/Urinary Tract: Adrenal glands are  unremarkable. Atrophic appearing kidneys. No hydronephrosis. Bladder is unremarkable. Stomach/Bowel: Status post partial sleeve gastrectomy. Appendix appears normal. No evidence of bowel wall thickening, distention, or inflammatory changes. Sigmoid diverticulosis. Vascular/Lymphatic: A previously seen right femoral vascular catheter has been removed with overlying postoperative change. Aortic atherosclerosis. No enlarged abdominal or pelvic lymph nodes. Reproductive: No mass or other significant abnormality. Other: No abdominal wall hernia or abnormality. Slight interval decrease size of a large, heterogeneous right retroperitoneal hematoma (series 3, image 56) tunneled Tenckhoff type peritoneal dialysis catheter, tip in the low abdomen. Trace ascites. Musculoskeletal: No acute or significant osseous findings. IMPRESSION: 1. Slight interval decrease in size of a large, heterogeneous right retroperitoneal hematoma. No evidence of new hemorrhage. 2. A previously seen right femoral vascular catheter has been removed with overlying postoperative change. 3. Tenckhoff type peritoneal dialysis catheter tip in the low abdomen. 4. Trace ascites. 5. Coronary artery disease. Aortic Atherosclerosis (ICD10-I70.0). Electronically Signed   By: Eddie Candle M.D.   On: 01/20/2019 16:17   CT ABDOMEN PELVIS WO CONTRAST  Result Date: 01/17/2019 CLINICAL DATA:  Abdominal and groin pain status post cardiac catheterization yesterday. EXAM: CT ABDOMEN AND PELVIS WITHOUT CONTRAST TECHNIQUE: Multidetector CT imaging of the abdomen and pelvis was performed following the standard protocol without IV contrast. COMPARISON:  CT abdomen pelvis dated April 18, 2018. FINDINGS: Lower chest: No acute abnormality. Subsegmental atelectasis in both lower lobes and lingula. 4 mm ground-glass nodule in the right middle lobe. Hepatobiliary: Subtle nodular contour of the left hepatic lobe again noted. No focal liver abnormality. Status post  cholecystectomy. No biliary dilatation. Pancreas: Unremarkable. No pancreatic ductal dilatation or surrounding inflammatory changes. Spleen: Normal in size without focal abnormality. Adrenals/Urinary Tract: The adrenal glands are unremarkable. Unchanged bilateral renal atrophy with trace residual contrast in both collecting systems. No hydronephrosis. The bladder is decompressed and contains excreted contrast. Stomach/Bowel: Unchanged small hiatal hernia. Postsurgical changes related to prior gastric sleeve resection again noted. No bowel wall thickening, distention, or surrounding inflammatory changes. Mild left-sided colonic diverticulosis. Normal appendix. Vascular/Lymphatic: Aortoiliac and branch vessel atherosclerosis. No enlarged abdominal or pelvic lymph nodes. Reproductive: Uterus and bilateral adnexa are unremarkable. Other: Right groin arterial sheath with the tip adjacent to but not clearly within the junction of the right external iliac and common femoral arteries. Large right-sided retroperitoneal hematoma measuring approximately 10.0 x 15.4 x 24.9 cm (AP by transverse by CC). Tiny foci of pneumoperitoneum overlying the liver likely related to peritoneal dialysis catheter. Musculoskeletal: No acute or significant osseous findings. IMPRESSION: 1. Large right-sided retroperitoneal hematoma measuring approximately 10.0 x 15.4 x 24.9 cm. 2. Right groin arterial sheath with the tip adjacent to but not clearly within the junction of the right external iliac and common femoral arteries. 3. Subtle nodular contour of the left hepatic lobe again noted, suspicious for cirrhosis. 4. Tiny foci of pneumoperitoneum again noted, likely related to peritoneal dialysis. 5.  Aortic atherosclerosis (ICD10-I70.0). Electronically Signed   By: Titus Dubin M.D.   On: 01/17/2019 18:10   DG Chest 2 View  Result Date: 02/11/2019 CLINICAL DATA:  Chest pain EXAM: CHEST - 2 VIEW COMPARISON:  01/17/2019 FINDINGS: The heart  size is stable from prior study. There is no pneumothorax. No large pleural effusion. No focal infiltrate. There is no acute osseous abnormality. Aortic calcifications are noted. IMPRESSION: No active cardiopulmonary disease. Electronically Signed   By: Constance Holster M.D.   On: 02/11/2019 21:44   DG Chest 2 View  Result Date: 01/15/2019 CLINICAL DATA:  Chest pain since 1 p.m., history end-stage renal disease on dialysis, hypertension, type II diabetes mellitus, coronary artery disease EXAM: CHEST - 2 VIEW COMPARISON:  06/18/2018 FINDINGS: Normal heart size, mediastinal contours, and pulmonary vascularity. Minimal subsegmental atelectasis at lingula and RIGHT base. Lungs otherwise clear. No infiltrate, pleural effusion or pneumothorax. Bones demineralized. IMPRESSION: Minimal bibasilar atelectasis. Electronically Signed   By: Lavonia Dana M.D.   On: 01/15/2019 15:35   CARDIAC CATHETERIZATION  Addendum Date: 01/17/2019    Mid LAD lesion is 75% stenosed. The LAD was not addressed and will be managed medically.  Ost LAD to Mid LAD lesion is 40% stenosed.  Mid Cx to Dist Cx lesion is 55% stenosed with 40% stenosed side branch in Ost 3rd Mrg.  Prox RCA lesion is 75% stenosed.  A drug-eluting stent was successfully placed using a SYNERGY XD 3.0X38, postdilated to 3.5 mm.  Post intervention, there is a 0% residual stenosis.  Ost 1st Mrg lesion is 99% stenosed. EBU4 Guide catheter used.  A drug-eluting stent was successfully placed using a SYNERGY XD 2.25X24, postdilated to 2.75 mm.  Post intervention, there is a 0% residual stenosis.  Continue DAPT for 12 months.  Would recommend clopidogrel long-term after one year since she has such diffuse disease.  Continue aggressive secondary prevention.   Result Date: 01/17/2019  Mid LAD lesion is 75% stenosed. The LAD was not addressed and will be managed medically.  Ost LAD to Mid LAD lesion is 40% stenosed.  Mid Cx to Dist Cx lesion is 55% stenosed  with 40% stenosed side branch in Ost 3rd Mrg.  Prox RCA lesion is 75% stenosed.  A drug-eluting stent was successfully placed using a SYNERGY XD 3.0X38, postdilated to 3.5 mm.  Post intervention, there is a 0% residual stenosis.  Ost 1st Mrg lesion is 99% stenosed. EBU4 Guide catheter used.  A drug-eluting stent was successfully placed using a SYNERGY XD 2.25X24, postdilated to 2.75 mm.  Post intervention, there is a 0% residual stenosis.  Continue DAPT for 12 months.  Would recommend clopidogrel long-term after one year since she has such diffuse disease.  Continue aggressive secondary prevention.   CARDIAC CATHETERIZATION  Result Date: 01/16/2019  Mid LAD lesion is 75% stenosed. The LAD is diffusely diseased and heavily calcified.  Mid Cx to Dist Cx lesion is 55% stenosed with 40% stenosed side branch in Ost 3rd Mrg.  Prox RCA lesion is 75% stenosed. This disease appears to have progressed.  Ost 1st Mrg-1 lesion is 50% stenosed.  Ost 1st Mrg-2 lesion is 99% stenosed. This lesion is significantly worse than prior.  The left ventricular systolic function is normal.  LV end diastolic pressure is normal.  The left ventricular ejection fraction is 55-65% by visual estimate.  There is no aortic valve stenosis.  Ost LAD to Mid LAD lesion is 40% stenosed.  Severe multivessel disease with progression of calcific disease in the mid LAD, proximal RCA and tortuous mid OM1.  Plan for surgical consult.    DG CHEST PORT 1 VIEW  Result Date: 01/17/2019 CLINICAL DATA:  Central line placement. EXAM: PORTABLE CHEST 1  VIEW COMPARISON:  Chest x-ray dated January 15, 2019. FINDINGS: New right internal jugular central venous catheter with tip at the cavoatrial junction. The heart size and mediastinal contours are within normal limits. Normal pulmonary vascularity. Minimal bibasilar atelectasis. No focal consolidation, pleural effusion, or pneumothorax. No acute osseous abnormality. IMPRESSION: New right  internal jugular central venous catheter without complicating feature. Electronically Signed   By: Titus Dubin M.D.   On: 01/17/2019 21:32   ECHOCARDIOGRAM COMPLETE  Result Date: 01/16/2019   ECHOCARDIOGRAM REPORT   Patient Name:   DANIELE DILLOW Date of Exam: 01/16/2019 Medical Rec #:  [id number]                 Height:       65.0 in Accession #:    [id number]                Weight:       289.5 lb Date of Birth:  Dec 24, 1965                BSA:          2.31 m Patient Age:    14 years                  BP:           116/73 mmHg Patient Gender: F                         HR:           69 bpm. Exam Location:  Inpatient Procedure: 2D Echo, Color Doppler, Cardiac Doppler and Intracardiac            Opacification Agent Indications:    Acute MI  History:        Patient has prior history of Echocardiogram examinations, most                 recent 01/04/2015. CAD; Risk Factors:Diabetes, Hypertension and                 Dyslipidemia. ESRD.  Sonographer:    Raquel Sarna Senior RDCS Referring Phys: [phone] [facility]  Sonographer Comments: Technically difficult study due to poor echo windows. IMPRESSIONS  1. Left ventricular ejection fraction, by visual estimation, is 65 to 70%. The left ventricle has hyperdynamic function. There is moderately increased left ventricular hypertrophy.  2. Definity contrast agent was given IV to delineate the left ventricular endocardial borders.  3. Left ventricular diastolic parameters are consistent with Grade I diastolic dysfunction (impaired relaxation).  4. The left ventricle has no regional wall motion abnormalities.  5. Global right ventricle has normal systolic function.The right ventricular size is mildly enlarged. No increase in right ventricular wall thickness.  6. Left atrial size was normal.  7. Right atrial size was normal.  8. The pericardial effusion is posterior to the left ventricle.  9. Trivial pericardial effusion is present. 10. The mitral valve was not  well visualized. Trivial mitral valve regurgitation. 11. The tricuspid valve is not well visualized. 12. The aortic valve is tricuspid. Aortic valve regurgitation is not visualized. Mild aortic valve sclerosis without stenosis. 13. The pulmonic valve was grossly normal. Pulmonic valve regurgitation is not visualized. 14. The inferior vena cava is normal in size with <50% respiratory variability, suggesting right atrial pressure of 8 mmHg. 15. The interatrial septum was not well visualized. FINDINGS  Left Ventricle: Left ventricular ejection fraction, by visual estimation, is 65  to 70%. The left ventricle has hyperdynamic function. Definity contrast agent was given IV to delineate the left ventricular endocardial borders. The left ventricle has no regional wall motion abnormalities. There is moderately increased left ventricular hypertrophy. Left ventricular diastolic parameters are consistent with Grade I diastolic dysfunction (impaired relaxation). Indeterminate filling pressures. Right Ventricle: The right ventricular size is mildly enlarged. No increase in right ventricular wall thickness. Global RV systolic function is has normal systolic function. Left Atrium: Left atrial size was normal in size. Right Atrium: Right atrial size was normal in size Pericardium: Trivial pericardial effusion is present. The pericardial effusion is posterior to the left ventricle. Mitral Valve: The mitral valve was not well visualized. Trivial mitral valve regurgitation. Tricuspid Valve: The tricuspid valve is not well visualized. Tricuspid valve regurgitation is not demonstrated. Aortic Valve: The aortic valve is tricuspid. Aortic valve regurgitation is not visualized. Mild aortic valve sclerosis is present, with no evidence of aortic valve stenosis. Pulmonic Valve: The pulmonic valve was grossly normal. Pulmonic valve regurgitation is not visualized. Pulmonic regurgitation is not visualized. Aorta: The aortic root and ascending  aorta are structurally normal, with no evidence of dilitation. Venous: The inferior vena cava is normal in size with less than 50% respiratory variability, suggesting right atrial pressure of 8 mmHg. IAS/Shunts: The interatrial septum was not well visualized.  LEFT VENTRICLE PLAX 2D LVIDd:         3.84 cm  Diastology LVIDs:         2.29 cm  LV e' lateral:   5.11 cm/s LV PW:         1.25 cm  LV E/e' lateral: 17.2 LV IVS:        1.27 cm  LV e' medial:    5.11 cm/s LVOT diam:     2.00 cm  LV E/e' medial:  17.2 LV SV:         46 ml LV SV Index:   18.01 LVOT Area:     3.14 cm  RIGHT VENTRICLE RV S prime:     12.40 cm/s TAPSE (M-mode): 1.5 cm LEFT ATRIUM             Index       RIGHT ATRIUM           Index LA diam:        4.20 cm 1.81 cm/m  RA Area:     20.50 cm LA Vol (A2C):   56.6 ml 24.45 ml/m RA Volume:   66.70 ml  28.81 ml/m LA Vol (A4C):   40.1 ml 17.32 ml/m LA Biplane Vol: 49.5 ml 21.38 ml/m  AORTIC VALVE LVOT Vmax:   78.30 cm/s LVOT Vmean:  53.400 cm/s LVOT VTI:    0.152 m  AORTA Ao Root diam: 2.70 cm Ao Asc diam:  3.30 cm MITRAL VALVE MV Area (PHT): 2.60 cm              SHUNTS MV PHT:        84.68 msec            Systemic VTI:  0.15 m MV Decel Time: 292 msec              Systemic Diam: 2.00 cm MV E velocity: 87.80 cm/s  103 cm/s MV A velocity: 102.00 cm/s 70.3 cm/s MV E/A ratio:  0.86        1.5  Lyman Bishop MD Electronically signed by Lyman Bishop MD Signature Date/Time: 01/16/2019/10:43:37 AM    Final  EKG EKG Interpretation  Date/Time:  Saturday February 11 2019 21:18:31 EST Ventricular Rate:  83 PR Interval:    QRS Duration: 96 QT Interval:  425 QTC Calculation: 500 R Axis:   4 Text Interpretation: Sinus rhythm Low voltage, precordial leads Borderline prolonged QT interval Confirmed by Lajean Saver [phone]) on 02/11/2019 9:54:01 PM   Radiology DG Chest 2 View  Result Date: 02/11/2019 CLINICAL DATA:  Chest pain EXAM: CHEST - 2 VIEW COMPARISON:  01/17/2019 FINDINGS: The heart size  is stable from prior study. There is no pneumothorax. No large pleural effusion. No focal infiltrate. There is no acute osseous abnormality. Aortic calcifications are noted. IMPRESSION: No active cardiopulmonary disease. Electronically Signed   By: Constance Holster M.D.   On: 02/11/2019 21:44    Procedures Procedures (including critical care time)  Medications Ordered in ED Medications  sodium chloride flush (NS) 0.9 % injection 3 mL (3 mLs Intravenous Not Given 02/11/19 2150)    ED Course  I have reviewed the triage vital signs and the nursing notes.  Pertinent labs & imaging results that were available during my care of the patient were reviewed by me and considered in my medical decision making (see chart for details).    MDM Rules/Calculators/A&P                      Iv ns. Continuous pulse ox and monitor. Stat ecg, labs, and pcxr.  Reviewed nursing notes and prior charts for additional history.   Labs reviewed/interpreted by me - initial trop normal.   CXR reviewed/interpreted by me - no pna.   Recheck pt, no current pain or discomfort.   2330, pt resting comfortably, no pain. Delta trop pending. Signed out to Dr Betsey Holiday to check delta trop - if normal/not increasing, and remains asymptomatic, feel stable for d/c w cardiology f/u Monday or Tuesday.      Final Clinical Impression(s) / ED Diagnoses Final diagnoses:  None    Rx / DC Orders ED Discharge Orders    None       Lajean Saver, MD 02/11/19 2335

## 2019-02-11 NOTE — Discharge Instructions (Signed)
It was our pleasure to provide your ER care today - we hope that you feel better.  Continue your medications.   Follow up with cardiologist in the next few days - call office Monday AM to arrange follow up appointment.   Return to ER right away if worse, new symptoms, recurrent or persistent chest pain, trouble breathing, or other concern.

## 2019-02-12 LAB — TROPONIN I (HIGH SENSITIVITY): Troponin I (High Sensitivity): 11 ng/L (ref <18)

## 2019-02-12 NOTE — ED Provider Notes (Signed)
Patient signed out to me by Dr. Ulyses Jarred to follow-up on second troponin.  Patient with history of coronary artery disease, status post NSTEMI with intervention 1 month ago.  Patient had atypical pain tonight.  Pain was at rest and spontaneously resolved.  She has not had any recurrence of pain here in the ER.  Recheck at 12:30 AM, she is resting comfortably.  She denies any pain, shortness of breath.  At her normal baseline.  Second troponin is negative as well.  Vital signs are all normal.  Patient felt to be safe for discharge and prompt follow-up with cardiology, return for any recurrent pain.   Orpah Greek, MD 02/12/19 [phone]

## 2019-02-14 ENCOUNTER — Ambulatory Visit: Payer: BC Managed Care – PPO | Admitting: Gastroenterology

## 2019-02-16 ENCOUNTER — Telehealth (HOSPITAL_COMMUNITY): Payer: Self-pay

## 2019-02-16 NOTE — Progress Notes (Signed)
POST OPERATIVE OFFICE NOTE    CC:  F/u for surgery  HPI:  This is a 54 y.o. female who is s/p exploration of right groin with primary closure of arteriotomy site and washout of RP hematoma on 01/17/2019 by Dr. Donzetta Matters.  She was last seen a couple days later and was doing better.  She was instructed to keep groin dry and f/u with Dr. Donzetta Matters in 3-4 weeks.  She presents today for follow up.  She recently presented to the ER for chest pain.  Troponin was normal.  She was discharged with f/u scheduled with cardiology.     She states that she has done well at home.  She states that she has been washing her groin with dial soap every day and patting it dry.   She uses a pad in her groin to keep it dry.  She says that she is changing it out once a day.  She said the other day it was more moist than normal and she is not sure if it was from sweat or drainage.  She has not had any fevers as she checks it daily for dialysis records.   She states she did have some numbness in her right thigh after surgery but this has resolved.   Allergies  Allergen Reactions  . Amlodipine Swelling  . Atorvastatin     Elevated LFT's  . Clonidine Derivatives Swelling    Limbs swell  . Doxycycline Nausea And Vomiting    "I threw up for 3 hours"  . Welchol [Colesevelam Hcl] Nausea Only    Current Outpatient Medications  Medication Sig Dispense Refill  . aspirin EC 81 MG EC tablet Take 1 tablet (81 mg total) by mouth daily. 30 tablet 0  . calcium acetate (PHOSLO) 667 MG capsule Take 667 mg by mouth 3 (three) times daily with meals.    . cinacalcet (SENSIPAR) 90 MG tablet Take 90 mg by mouth at bedtime.    . Cyanocobalamin (B-12 PO) Take 1 tablet by mouth daily.    . ferric citrate (AURYXIA) 1 GM 210 MG(Fe) tablet Take 2 tablets (420 mg total) by mouth 3 (three) times daily with meals. 616 tablet 0  . folic acid (FOLVITE) 1 MG tablet Take 1 tablet (1 mg total) by mouth daily. 90 tablet 1  . gabapentin (NEURONTIN) 100  MG capsule Take 100 mg by mouth 2 (two) times daily.    Marland Kitchen gentamicin cream (GARAMYCIN) 0.1 % Apply 1 application topically See admin instructions. Apply to exit site daily as directed.  4  . metFORMIN (GLUCOPHAGE) 500 MG tablet Take 1 tablet (500 mg total) by mouth 2 (two) times daily with a meal. 60 tablet 5  . nitroGLYCERIN (NITROSTAT) 0.4 MG SL tablet PLACE 1 TABLET (0.4 MG TOTAL) UNDER THE TONGUE EVERY 5 (FIVE) MINUTES AS NEEDED FOR CHEST PAIN. 50 tablet 0  . ondansetron (ZOFRAN ODT) 8 MG disintegrating tablet Take 1 tablet (8 mg total) by mouth every 8 (eight) hours as needed for nausea or vomiting. (Patient not taking: Reported on 02/11/2019) 10 tablet 0  . rosuvastatin (CRESTOR) 10 MG tablet Take 1 tablet (10 mg total) by mouth daily. 90 tablet 3  . ticagrelor (BRILINTA) 90 MG TABS tablet Take 1 tablet (90 mg total) by mouth 2 (two) times daily. 180 tablet 3   No current facility-administered medications for this visit.     ROS:  See HPI  Physical Exam:  Today's Vitals   02/17/19 1408  BP: Marland Kitchen)  148/85  Pulse: 78  Resp: 16  Temp: (!) 97 F (36.1 C)  TempSrc: Temporal  SpO2: 100%  Weight: 277 lb (125.6 kg)  Height: 5' 5"  (1.651 m)   Body mass index is 46.1 kg/m.   Incision:      Extremities:  +palpable right DP pulse.    Assessment/Plan:  This is a 54 y.o. female who is s/p: exploration of right groin with primary closure of arteriotomy site and washout of RP hematoma on 01/17/2020 by Dr. Donzetta Matters.  -pt doing well.  Staples removed from incision today.  There is no evidence of infection but there is one area distally that is marginal.  I have advised the pt to keep the groin dry and change the dry gauze at least twice daily and more if it gets moist.  I have asked her to keep an eye out if she thinks this is drainage from her groin.  She will also use the hair dryer after she gets out of the shower to make sure it is dry.   -we will have her return in 1-2 weeks to check her  incision on a Friday either with Dr. Donzetta Matters or the PA.  She will call sooner if she has any issues.    Leontine Locket, PA-C [facility] [phone]  Clinic MD:  On call MD is Donzetta Matters

## 2019-02-16 NOTE — Telephone Encounter (Signed)

## 2019-02-17 ENCOUNTER — Ambulatory Visit (INDEPENDENT_AMBULATORY_CARE_PROVIDER_SITE_OTHER): Payer: Self-pay | Admitting: Physician Assistant

## 2019-02-17 ENCOUNTER — Other Ambulatory Visit: Payer: Self-pay

## 2019-02-17 VITALS — BP 148/85 | HR 78 | Temp 97.0°F | Resp 16 | Ht 65.0 in | Wt 277.0 lb

## 2019-02-17 DIAGNOSIS — R58 Hemorrhage, not elsewhere classified: Secondary | ICD-10-CM

## 2019-02-22 ENCOUNTER — Ambulatory Visit
Admission: RE | Admit: 2019-02-22 | Discharge: 2019-02-22 | Disposition: A | Discharge status: Home / Self Care | Payer: BC Managed Care – PPO | Source: Ambulatory Visit | Attending: Nephrology | Admitting: Nephrology

## 2019-02-22 ENCOUNTER — Other Ambulatory Visit: Payer: Self-pay

## 2019-02-22 DIAGNOSIS — R58 Hemorrhage, not elsewhere classified: Secondary | ICD-10-CM

## 2019-02-27 ENCOUNTER — Telehealth: Payer: Self-pay | Admitting: Cardiovascular Disease

## 2019-02-27 NOTE — Telephone Encounter (Signed)
Pt c/o of Chest Pain: STAT if CP now or developed within 24 hours  1. Are you having CP right now? no  2. Are you experiencing any other symptoms (ex. SOB, nausea, vomiting, sweating)? Yes, SOB and vomiting  3. How long have you been experiencing CP? Started at 12:30pm and lasted about an hour and a half  4. Is your CP continuous or coming and going? continuous  5. Have you taken Nitroglycerin? Yes, took 1 nitroglycerin, didn't stop, then took 4 aspirin and threw up. Then took another nitroglycerin and 4 more aspirin.  ?

## 2019-02-27 NOTE — Telephone Encounter (Signed)
Received STAT call concerning CP, SOB, vomiting x1. Patient has had CP since 12:30 today, has taken NTG, and ASA. She has known CAD. She was in ED on Jan 23 for CP and reports her troponin was OK. She was not admitted. Advised that she should go to [facility] ED for evaluation for acute chest pain symptoms today and will notify Dr. Oval Linsey

## 2019-02-28 ENCOUNTER — Ambulatory Visit: Payer: BC Managed Care – PPO

## 2019-02-28 ENCOUNTER — Telehealth (HOSPITAL_COMMUNITY): Payer: Self-pay

## 2019-02-28 NOTE — Telephone Encounter (Signed)

## 2019-03-01 ENCOUNTER — Ambulatory Visit (INDEPENDENT_AMBULATORY_CARE_PROVIDER_SITE_OTHER): Payer: Self-pay | Admitting: Physician Assistant

## 2019-03-01 ENCOUNTER — Other Ambulatory Visit: Payer: Self-pay

## 2019-03-01 DIAGNOSIS — R58 Hemorrhage, not elsewhere classified: Secondary | ICD-10-CM

## 2019-03-01 NOTE — Progress Notes (Signed)
POST OPERATIVE OFFICE NOTE    CC:  F/u for surgery  HPI:  This is a 54 y.o. female who is s/p exploration of right groin with primary closure of arteriotomy site and washout of RP hematoma on 01/17/2019 by Dr. Donzetta Matters.  She was last seen a couple days later and was doing better.  She was instructed to keep groin dry and f/u with Dr. Donzetta Matters in 3-4 weeks.  She was seen back on 02/17/2019 and at that time, she was washing her incision every day with dial soap and using a pad in her groin to keep it dry.  She was changing it daily and it was getting moist.  She was instructed to change more often and keep dry as she had a small area that wasn't quite healed but overall healing nicely.    She comes back today for a wound check.  She was supposed to f/u on a Friday when Dr. Donzetta Matters was here, but had to change the appt due to a death in the family.  She states that her husband has been keeping this clean and putting the dry gauze in the groin.  She has not had any fevers or drainage from the wound.  She does not have any pain in her feet and overall doing well.   She has not had any fevers/chills.  She states that her blood pressure has been low with dialysis (she is on peritoneal dialysis)  Allergies  Allergen Reactions  . Amlodipine Swelling  . Atorvastatin     Elevated LFT's  . Clonidine Derivatives Swelling    Limbs swell  . Doxycycline Nausea And Vomiting    "I threw up for 3 hours"  . Welchol [Colesevelam Hcl] Nausea Only    Current Outpatient Medications  Medication Sig Dispense Refill  . aspirin EC 81 MG EC tablet Take 1 tablet (81 mg total) by mouth daily. 30 tablet 0  . calcium acetate (PHOSLO) 667 MG capsule Take 667 mg by mouth 3 (three) times daily with meals.    . cinacalcet (SENSIPAR) 90 MG tablet Take 90 mg by mouth at bedtime.    . Cyanocobalamin (B-12 PO) Take 1 tablet by mouth daily.    . ferric citrate (AURYXIA) 1 GM 210 MG(Fe) tablet Take 2 tablets (420 mg total) by mouth 3  (three) times daily with meals. 161 tablet 0  . folic acid (FOLVITE) 1 MG tablet Take 1 tablet (1 mg total) by mouth daily. 90 tablet 1  . gabapentin (NEURONTIN) 100 MG capsule Take 100 mg by mouth 2 (two) times daily.    Marland Kitchen gentamicin cream (GARAMYCIN) 0.1 % Apply 1 application topically See admin instructions. Apply to exit site daily as directed.  4  . metFORMIN (GLUCOPHAGE) 500 MG tablet Take 1 tablet (500 mg total) by mouth 2 (two) times daily with a meal. 60 tablet 5  . nitroGLYCERIN (NITROSTAT) 0.4 MG SL tablet PLACE 1 TABLET (0.4 MG TOTAL) UNDER THE TONGUE EVERY 5 (FIVE) MINUTES AS NEEDED FOR CHEST PAIN. 50 tablet 0  . ondansetron (ZOFRAN ODT) 8 MG disintegrating tablet Take 1 tablet (8 mg total) by mouth every 8 (eight) hours as needed for nausea or vomiting. 10 tablet 0  . rosuvastatin (CRESTOR) 10 MG tablet Take 1 tablet (10 mg total) by mouth daily. 90 tablet 3  . ticagrelor (BRILINTA) 90 MG TABS tablet Take 1 tablet (90 mg total) by mouth 2 (two) times daily. 180 tablet 3   No current facility-administered  medications for this visit.     ROS:  See HPI  Physical Exam:   Incision:      Extremities:  + doppler signals right DP/PT/peroneal   Assessment/Plan:  This is a 54 y.o. female who is s/p: exploration of right groin with primary closure of arteriotomy site and washout of RP hematoma on 01/17/2019 by Dr. Donzetta Matters.   -pt's incision is healing well overall.  There is a small area in a crease that is slow to heal.  She will have her husband be more vigilant about putting a dry gauze in the crease to help this heal.  She has been doing a good job keeping this clean and dry.  -she will f/u on 2/19 with either Dr. Donzetta Matters or PA to re-evaluate incision.   -she will call sooner if she has any issues.    Leontine Locket, PA-C [facility] [phone]  Clinic MD:  Scot Dock

## 2019-03-02 ENCOUNTER — Encounter (HOSPITAL_COMMUNITY): Payer: Self-pay

## 2019-03-02 ENCOUNTER — Inpatient Hospital Stay (HOSPITAL_COMMUNITY)
Admission: EM | Admit: 2019-03-02 | Discharge: 2019-03-14 | DRG: 280 | Disposition: A | Discharge status: Home Health | Payer: Medicare Other | Attending: Internal Medicine | Admitting: Internal Medicine

## 2019-03-02 ENCOUNTER — Emergency Department (HOSPITAL_COMMUNITY): Payer: Medicare Other

## 2019-03-02 DIAGNOSIS — I132 Hypertensive heart and chronic kidney disease with heart failure and with stage 5 chronic kidney disease, or end stage renal disease: Secondary | ICD-10-CM | POA: Diagnosis present

## 2019-03-02 DIAGNOSIS — N186 End stage renal disease: Secondary | ICD-10-CM | POA: Diagnosis present

## 2019-03-02 DIAGNOSIS — A4189 Other specified sepsis: Secondary | ICD-10-CM | POA: Diagnosis not present

## 2019-03-02 DIAGNOSIS — I252 Old myocardial infarction: Secondary | ICD-10-CM

## 2019-03-02 DIAGNOSIS — K30 Functional dyspepsia: Secondary | ICD-10-CM | POA: Diagnosis not present

## 2019-03-02 DIAGNOSIS — E871 Hypo-osmolality and hyponatremia: Secondary | ICD-10-CM | POA: Diagnosis not present

## 2019-03-02 DIAGNOSIS — R778 Other specified abnormalities of plasma proteins: Secondary | ICD-10-CM | POA: Diagnosis present

## 2019-03-02 DIAGNOSIS — Z992 Dependence on renal dialysis: Secondary | ICD-10-CM

## 2019-03-02 DIAGNOSIS — Y712 Prosthetic and other implants, materials and accessory cardiovascular devices associated with adverse incidents: Secondary | ICD-10-CM | POA: Diagnosis present

## 2019-03-02 DIAGNOSIS — T82867A Thrombosis of cardiac prosthetic devices, implants and grafts, initial encounter: Principal | ICD-10-CM | POA: Diagnosis present

## 2019-03-02 DIAGNOSIS — Z7982 Long term (current) use of aspirin: Secondary | ICD-10-CM

## 2019-03-02 DIAGNOSIS — Z87891 Personal history of nicotine dependence: Secondary | ICD-10-CM

## 2019-03-02 DIAGNOSIS — I25110 Atherosclerotic heart disease of native coronary artery with unstable angina pectoris: Secondary | ICD-10-CM | POA: Diagnosis present

## 2019-03-02 DIAGNOSIS — Z9049 Acquired absence of other specified parts of digestive tract: Secondary | ICD-10-CM

## 2019-03-02 DIAGNOSIS — M79601 Pain in right arm: Secondary | ICD-10-CM | POA: Diagnosis not present

## 2019-03-02 DIAGNOSIS — I200 Unstable angina: Secondary | ICD-10-CM | POA: Diagnosis present

## 2019-03-02 DIAGNOSIS — E1122 Type 2 diabetes mellitus with diabetic chronic kidney disease: Secondary | ICD-10-CM | POA: Diagnosis present

## 2019-03-02 DIAGNOSIS — I5032 Chronic diastolic (congestive) heart failure: Secondary | ICD-10-CM | POA: Diagnosis present

## 2019-03-02 DIAGNOSIS — Z7902 Long term (current) use of antithrombotics/antiplatelets: Secondary | ICD-10-CM

## 2019-03-02 DIAGNOSIS — Z6841 Body Mass Index (BMI) 40.0 and over, adult: Secondary | ICD-10-CM

## 2019-03-02 DIAGNOSIS — K219 Gastro-esophageal reflux disease without esophagitis: Secondary | ICD-10-CM | POA: Diagnosis present

## 2019-03-02 DIAGNOSIS — U071 COVID-19: Secondary | ICD-10-CM | POA: Diagnosis present

## 2019-03-02 DIAGNOSIS — Z79899 Other long term (current) drug therapy: Secondary | ICD-10-CM

## 2019-03-02 DIAGNOSIS — E1121 Type 2 diabetes mellitus with diabetic nephropathy: Secondary | ICD-10-CM

## 2019-03-02 DIAGNOSIS — D631 Anemia in chronic kidney disease: Secondary | ICD-10-CM | POA: Diagnosis present

## 2019-03-02 DIAGNOSIS — D638 Anemia in other chronic diseases classified elsewhere: Secondary | ICD-10-CM | POA: Diagnosis present

## 2019-03-02 DIAGNOSIS — R509 Fever, unspecified: Secondary | ICD-10-CM

## 2019-03-02 DIAGNOSIS — I214 Non-ST elevation (NSTEMI) myocardial infarction: Secondary | ICD-10-CM

## 2019-03-02 DIAGNOSIS — Z8249 Family history of ischemic heart disease and other diseases of the circulatory system: Secondary | ICD-10-CM

## 2019-03-02 DIAGNOSIS — R079 Chest pain, unspecified: Secondary | ICD-10-CM

## 2019-03-02 DIAGNOSIS — Z888 Allergy status to other drugs, medicaments and biological substances status: Secondary | ICD-10-CM

## 2019-03-02 DIAGNOSIS — K7460 Unspecified cirrhosis of liver: Secondary | ICD-10-CM | POA: Diagnosis present

## 2019-03-02 DIAGNOSIS — J1282 Pneumonia due to coronavirus disease 2019: Secondary | ICD-10-CM | POA: Diagnosis not present

## 2019-03-02 DIAGNOSIS — J159 Unspecified bacterial pneumonia: Secondary | ICD-10-CM | POA: Diagnosis not present

## 2019-03-02 DIAGNOSIS — E876 Hypokalemia: Secondary | ICD-10-CM | POA: Diagnosis present

## 2019-03-02 DIAGNOSIS — M7989 Other specified soft tissue disorders: Secondary | ICD-10-CM | POA: Diagnosis present

## 2019-03-02 DIAGNOSIS — B957 Other staphylococcus as the cause of diseases classified elsewhere: Secondary | ICD-10-CM | POA: Diagnosis present

## 2019-03-02 DIAGNOSIS — N2581 Secondary hyperparathyroidism of renal origin: Secondary | ICD-10-CM | POA: Diagnosis present

## 2019-03-02 DIAGNOSIS — I2584 Coronary atherosclerosis due to calcified coronary lesion: Secondary | ICD-10-CM | POA: Diagnosis present

## 2019-03-02 DIAGNOSIS — E1129 Type 2 diabetes mellitus with other diabetic kidney complication: Secondary | ICD-10-CM | POA: Diagnosis present

## 2019-03-02 DIAGNOSIS — Z955 Presence of coronary angioplasty implant and graft: Secondary | ICD-10-CM

## 2019-03-02 DIAGNOSIS — Z833 Family history of diabetes mellitus: Secondary | ICD-10-CM

## 2019-03-02 DIAGNOSIS — E8889 Other specified metabolic disorders: Secondary | ICD-10-CM | POA: Diagnosis present

## 2019-03-02 DIAGNOSIS — E785 Hyperlipidemia, unspecified: Secondary | ICD-10-CM | POA: Diagnosis present

## 2019-03-02 LAB — CBC WITH DIFFERENTIAL/PLATELET
Abs Immature Granulocytes: 0.04 10*3/uL (ref 0.00–0.07)
Basophils Absolute: 0 10*3/uL (ref 0.0–0.1)
Basophils Relative: 0 %
Eosinophils Absolute: 0.2 10*3/uL (ref 0.0–0.5)
Eosinophils Relative: 2 %
HCT: 38 % (ref 36.0–46.0)
Hemoglobin: 12 g/dL (ref 12.0–15.0)
Immature Granulocytes: 0 %
Lymphocytes Relative: 16 %
Lymphs Abs: 1.6 10*3/uL (ref 0.7–4.0)
MCH: 31.7 pg (ref 26.0–34.0)
MCHC: 31.6 g/dL (ref 30.0–36.0)
MCV: 100.5 fL — ABNORMAL HIGH (ref 80.0–100.0)
Monocytes Absolute: 0.9 10*3/uL (ref 0.1–1.0)
Monocytes Relative: 9 %
Neutro Abs: 7 10*3/uL (ref 1.7–7.7)
Neutrophils Relative %: 73 %
Platelets: 479 10*3/uL — ABNORMAL HIGH (ref 150–400)
RBC: 3.78 MIL/uL — ABNORMAL LOW (ref 3.87–5.11)
RDW: 16.1 % — ABNORMAL HIGH (ref 11.5–15.5)
WBC: 9.8 10*3/uL (ref 4.0–10.5)
nRBC: 0 % (ref 0.0–0.2)

## 2019-03-02 LAB — BASIC METABOLIC PANEL
Anion gap: 15 (ref 5–15)
BUN: 46 mg/dL — ABNORMAL HIGH (ref 6–20)
CO2: 27 mmol/L (ref 22–32)
Calcium: 8.7 mg/dL — ABNORMAL LOW (ref 8.9–10.3)
Chloride: 97 mmol/L — ABNORMAL LOW (ref 98–111)
Creatinine, Ser: 14.41 mg/dL — ABNORMAL HIGH (ref 0.44–1.00)
GFR calc Af Amer: 3 mL/min — ABNORMAL LOW (ref >60)
GFR calc non Af Amer: 3 mL/min — ABNORMAL LOW (ref >60)
Glucose, Bld: 155 mg/dL — ABNORMAL HIGH (ref 70–99)
Potassium: 3.3 mmol/L — ABNORMAL LOW (ref 3.5–5.1)
Sodium: 139 mmol/L (ref 135–145)

## 2019-03-02 LAB — TROPONIN I (HIGH SENSITIVITY)
Troponin I (High Sensitivity): 119 ng/L (ref <18)
Troponin I (High Sensitivity): 132 ng/L (ref <18)

## 2019-03-02 MED ORDER — HEPARIN BOLUS VIA INFUSION
4000.0000 [IU] | Freq: Once | INTRAVENOUS | Status: AC
  Administered 2019-03-02: 4000 [IU] via INTRAVENOUS
  Filled 2019-03-02: qty 4000

## 2019-03-02 MED ORDER — HEPARIN (PORCINE) 25000 UT/250ML-% IV SOLN
1000.0000 [IU]/h | INTRAVENOUS | Status: DC
  Administered 2019-03-02: 1000 [IU]/h via INTRAVENOUS
  Filled 2019-03-02 (×2): qty 250

## 2019-03-02 NOTE — ED Notes (Signed)
Visually checked on pt. Pt stated she felt well. Reconnected BP cuff and obtain new set  Of Vitals

## 2019-03-02 NOTE — Progress Notes (Signed)
Called and spoke with Page RN regarding consult. There are no infusion orders or MD order to place PIV at this time. Notified to re-consult [facility] when ordered. VU. Fran Lowes, RN [facility]

## 2019-03-02 NOTE — ED Notes (Signed)
Had checked IV with flush and Pt said it was ok when her arm was straight. Checked iV site and ityt looked good, no problems However, once started bolus of Heparin she stated it was causing her pain. Stopped bolus and reexamined IV. Will reassess IV site and determine further.

## 2019-03-02 NOTE — ED Provider Notes (Signed)
[facility] EMERGENCY DEPARTMENT Provider Note   CSN: [id number] Arrival date & time: 03/02/19  1634     History Chief Complaint  Patient presents with  . Chest Pain    Brenda Davis is a 54 y.o. female.  Presents to the emergency department with chest pain.  Patient reports an episode of chest pain yesterday in the morning.  Occurred at rest, pressure, radiated to her right shoulder, resolved after taking a couple nitroglycerin and aspirin.  Today this afternoon had another episode of chest pain, sharp, pressure, again radiating to her right shoulder, this had mild improvement after 1 nitroglycerin, but still having ongoing pain.  Called EMS.  Took aspirin and another nitroglycerin, then pain has since resolved.  Currently no ongoing pain.  Today also feeling more short of breath than normal.  Worse with exertion.  Peritoneal dialysis, no missed sessions.  Per chart review, extensive history of CAD, left heart cath in December 2020, DES x1 to ostial first margin.  Extensive multivessel disease, recommended continue DAPT for at least 12 months.  HPI     Past Medical History:  Diagnosis Date  . Anemia   . CAD (coronary artery disease)    a. cath in 02/2018 showing moderate 3-vessel CAD with 45% mid-LADm 55% LCx and 65% RCA stenosis which was not significant by FFR  . ESRD on dialysis ([place])   . Gout   . HCAP (healthcare-associated pneumonia) 05/06/2016  . History of blood transfusion    "related to surgery"  . Hyperlipidemia   . Hypertension   . Morbid obesity ([place])   . Pain    LEFT SHOULDER PAIN - WAS SEEN AT AN URGENT CARE - GIVEN SLING FOR COMFORT AND TOLD ROM AS TOLERATED.  Marland Kitchen Palpitations 09/24/2016  . Peritonitis, dialysis-associated ([place])   . Type II diabetes mellitus ([place])    "gastric sleeve OR corrected this" (05/06/2016)    Patient Active Problem List   Diagnosis Date Noted  . Retroperitoneal hemorrhage - post Cath/PCI - taken to OR for  drainage & CFA repair 01/18/2019  . Acute blood loss anemia - resolved, transfused in OR - Hgb stable 01/18/2019  . Increased anion gap metabolic acidosis 43/15/4008  . Shock circulatory ([place]) - secondary to acute blood loss anemia, elevated WBC (? sepsis vs post-op inflammation).h 01/18/2019  . Postprocedural hypotension   . Hyperkalemia   . Dysuria 11/25/2018  . Coronary artery disease involving native coronary artery of native heart with unstable angina pectoris ([place])   . Hypocalcemia 06/18/2018  . Chest pain, rule out acute myocardial infarction 06/18/2018  . Localized swelling of finger of left hand 06/01/2018  . Sepsis ([place]) 05/23/2018  . C. difficile diarrhea 05/08/2018  . Diarrhea of infectious origin 05/04/2018  . Hemodialysis-associated hypotension 04/28/2018  . Elevated troponin 03/08/2018  . Folate deficiency 03/08/2018  . NSTEMI (non-ST elevated myocardial infarction) ([place])   . CAD in native artery   . Hepatic cirrhosis ([place]) 11/24/2017  . ESRD on peritoneal dialysis ([place]) 08/25/2017  . DM (diabetes mellitus), type 2 with renal complications ([place]) 67/61/9509  . Chronic heart failure with preserved ejection fraction ([place]) 06/22/2017  . Trigger finger, left middle finger 09/14/2016  . Sensorineural hearing loss (SNHL), bilateral 06/29/2016  . Allergic rhinitis 06/27/2016  . Severe persistent asthma with exacerbation 05/12/2016  . Acute respiratory failure with hypoxia and hypercapnia ([place]) 05/06/2016  . Anemia of chronic disease 05/06/2016  . Routine general medical examination at a health care facility  12/05/2015  . Cervical cancer screening 12/05/2015  . Visit for screening mammogram 01/23/2015  . Gout due to renal impairment 09/13/2014  . Thiamine deficiency 12/02/2012  . Hypokalemia 11/28/2012  . S/P laparoscopic sleeve gastrectomy 11/28/2012  . B12 deficiency anemia 11/28/2012  . Morbid obesity with BMI of 40.0-44.9, adult ([place]) 07/22/2012  . Dyslipidemia, goal  LDL below 70 04/09/2008  . CKD (chronic kidney disease) stage V requiring chronic dialysis (MWF) 02/08/2008    Past Surgical History:  Procedure Laterality Date  . AV FISTULA PLACEMENT Left 01/03/2015   Procedure: BRACHIAL CEPHALIC ARTERIOVENOUS  FISTULA CREATION LEFT ARM;  Surgeon: Angelia Mould, MD;  Location: [facility];  Service: Vascular;  Laterality: Left;  . CARPAL TUNNEL RELEASE Right   . [place]  . CHOLECYSTECTOMY  09/14/2017  . CORONARY STENT INTERVENTION N/A 01/17/2019   Procedure: CORONARY STENT INTERVENTION;  Surgeon: Jettie Booze, MD;  Location: [facility];  Service: Cardiovascular;  Laterality: N/A;  . ESOPHAGOGASTRODUODENOSCOPY N/A 09/04/2012   Procedure: ESOPHAGOGASTRODUODENOSCOPY (EGD);  Surgeon: Shann Medal, MD;  Location: [facility];  Service: General;  Laterality: N/A;  PF  . ESOPHAGOGASTRODUODENOSCOPY (EGD) WITH ESOPHAGEAL DILATION N/A 09/29/2012   Procedure: ESOPHAGOGASTRODUODENOSCOPY (EGD) WITH ESOPHAGEAL DILATION;  Surgeon: Milus Banister, MD;  Location: [facility];  Service: Endoscopy;  Laterality: N/A;  . FEMORAL ARTERY EXPLORATION Right 01/17/2019   Procedure: Exploration Right Groin with Primary Closure or Arteriotomy Site; Washout of Retroperitoneal Hematoma;  Surgeon: Waynetta Sandy, MD;  Location: [facility];  Service: Vascular;  Laterality: Right;  . INSERTION OF DIALYSIS CATHETER N/A 01/03/2015   Procedure: INSERTION OF DIALYSIS CATHETER RIGHT INTERNAL JUGULAR;  Surgeon: Angelia Mould, MD;  Location: [facility];  Service: Vascular;  Laterality: N/A;  . LAPAROSCOPIC GASTRIC SLEEVE RESECTION N/A 07/19/2012   Procedure: LAPAROSCOPIC SLEEVE GASTRECTOMY with EGD;  Surgeon: Madilyn Hook, DO;  Location: [facility];  Service: General;  Laterality: N/A;  laparoscopic sleeve gastrectomy with EGD  . LEFT HEART CATH AND CORONARY ANGIOGRAPHY N/A 02/25/2018   Procedure: LEFT HEART CATH AND CORONARY ANGIOGRAPHY;  Surgeon:  Leonie Man, MD;  Location: [facility];  Service: Cardiovascular;  Laterality: N/A;  . LEFT HEART CATH AND CORONARY ANGIOGRAPHY N/A 06/20/2018   Procedure: LEFT HEART CATH AND CORONARY ANGIOGRAPHY;  Surgeon: Nelva Bush, MD;  Location: [facility];  Service: Cardiovascular;  Laterality: N/A;  . LEFT HEART CATH AND CORONARY ANGIOGRAPHY N/A 01/16/2019   Procedure: LEFT HEART CATH AND CORONARY ANGIOGRAPHY;  Surgeon: Jettie Booze, MD;  Location: [facility];  Service: Cardiovascular;  Laterality: N/A;  . PERIPHERAL VASCULAR CATHETERIZATION Left 05/23/2015   Procedure: Nolon Stalls;  Surgeon: Conrad Kaylor, MD;  Location: [facility];  Service: Cardiovascular;  Laterality: Left;  upper aRM  . PERIPHERAL VASCULAR CATHETERIZATION Left 05/23/2015   Procedure: Peripheral Vascular Balloon Angioplasty;  Surgeon: Conrad Shasta, MD;  Location: [facility];  Service: Cardiovascular;  Laterality: Left;  av fistula  . TUBAL LIGATION  1993  . UPPER GI ENDOSCOPY N/A 07/19/2012   Procedure: UPPER GI ENDOSCOPY;  Surgeon: Madilyn Hook, DO;  [facility]: [facility];  Service: General;  Laterality: N/A;     OB History   No obstetric history on file.     Family History  Problem Relation Age of Onset  . Hypertension Mother   . Mitral valve prolapse Mother   . Diabetes Father   . Arthritis Other   .  Hyperlipidemia Other   . Cancer Neg Hx   . Heart disease Neg Hx   . Kidney disease Neg Hx   . Stroke Neg Hx     Social History   Tobacco Use  . Smoking status: Former Smoker    Packs/day: 1.00    Years: 16.00    Pack years: 16.00    Types: Cigarettes    Quit date: 2009    Years since quitting: 12.1  . Smokeless tobacco: Never Used  Substance Use Topics  . Alcohol use: Never  . Drug use: No    Home Medications Prior to Admission medications   Medication Sig Start Date End Date Taking? Authorizing Provider  aspirin EC 81 MG EC tablet Take 1 tablet (81 mg total)  by mouth daily. 02/26/18  Yes Smith, Rondell A, MD  calcium acetate (PHOSLO) 667 MG capsule Take 667 mg by mouth 3 (three) times daily with meals. 06/02/18  Yes [provider]  cinacalcet (SENSIPAR) 90 MG tablet Take 90 mg by mouth at bedtime. 12/08/18  Yes [provider]  Cyanocobalamin (B-12 PO) Take 1 tablet by mouth daily.   Yes [provider]  ferric citrate (AURYXIA) 1 GM 210 MG(Fe) tablet Take 2 tablets (420 mg total) by mouth 3 (three) times daily with meals. 01/23/19  Yes Daune Perch, NP  folic acid (FOLVITE) 1 MG tablet Take 1 tablet (1 mg total) by mouth daily. 03/08/18  Yes [name], MD  gabapentin (NEURONTIN) 100 MG capsule Take 100 mg by mouth 2 (two) times daily. 10/31/18  Yes [provider]  gentamicin cream (GARAMYCIN) 0.1 % Apply 1 application topically See admin instructions. Apply to exit site daily as directed. 04/26/17  Yes [provider]  nitroGLYCERIN (NITROSTAT) 0.4 MG SL tablet PLACE 1 TABLET (0.4 MG TOTAL) UNDER THE TONGUE EVERY 5 (FIVE) MINUTES AS NEEDED FOR CHEST PAIN. 08/01/18  Yes Skeet Latch, MD  ondansetron (ZOFRAN ODT) 8 MG disintegrating tablet Take 1 tablet (8 mg total) by mouth every 8 (eight) hours as needed for nausea or vomiting. 04/09/18  Yes Jola Schmidt, MD  rosuvastatin (CRESTOR) 10 MG tablet Take 1 tablet (10 mg total) by mouth daily. 02/01/19 05/02/19 Yes Cleaver, Jossie Ng, NP  ticagrelor (BRILINTA) 90 MG TABS tablet Take 1 tablet (90 mg total) by mouth 2 (two) times daily. 02/01/19 01/27/20 Yes Cleaver, Jossie Ng, NP  metFORMIN (GLUCOPHAGE) 500 MG tablet Take 1 tablet (500 mg total) by mouth 2 (two) times daily with a meal. Patient not taking: Reported on 03/02/2019 01/23/19 01/23/20  Daune Perch, NP    Allergies    Amlodipine, Atorvastatin, Clonidine derivatives, Doxycycline, and Welchol [colesevelam hcl]  Review of Systems   Review of Systems  Constitutional: Negative for chills and fever.  HENT:  Negative for ear pain and sore throat.   Eyes: Negative for pain and visual disturbance.  Respiratory: Positive for shortness of breath. Negative for cough.   Cardiovascular: Positive for chest pain. Negative for palpitations.  Gastrointestinal: Negative for abdominal pain and vomiting.  Genitourinary: Negative for dysuria and hematuria.  Musculoskeletal: Negative for arthralgias and back pain.  Skin: Negative for color change and rash.  Neurological: Negative for seizures and syncope.  All other systems reviewed and are negative.   Physical Exam Updated Vital Signs BP 126/72 (BP Location: Right Arm)   Pulse 81   Temp 98.1 F (36.7 C) (Oral)   Resp 16   SpO2 99%   Physical Exam Vitals and  nursing note reviewed.  Constitutional:      General: She is not in acute distress.    Appearance: She is well-developed.  HENT:     Head: Normocephalic and atraumatic.  Eyes:     Conjunctiva/sclera: Conjunctivae normal.  Cardiovascular:     Rate and Rhythm: Normal rate and regular rhythm.     Heart sounds: No murmur.  Pulmonary:     Effort: Pulmonary effort is normal. No respiratory distress.     Breath sounds: Normal breath sounds.  Abdominal:     Palpations: Abdomen is soft.     Tenderness: There is no abdominal tenderness.  Musculoskeletal:     Cervical back: Neck supple.     Right lower leg: No tenderness. No edema.     Left lower leg: No tenderness. No edema.  Skin:    General: Skin is warm and dry.     Capillary Refill: Capillary refill takes less than 2 seconds.  Neurological:     General: No focal deficit present.     Mental Status: She is alert and oriented to person, place, and time.     ED Results / Procedures / Treatments   Labs (all labs ordered are listed, but only abnormal results are displayed) Labs Reviewed  CBC WITH DIFFERENTIAL/PLATELET  BASIC METABOLIC PANEL  TROPONIN I (HIGH SENSITIVITY)    EKG EKG Interpretation  Date/Time:  Thursday March 02 2019 16:41:21 EST Ventricular Rate:  82 PR Interval:    QRS Duration: 103 QT Interval:  439 QTC Calculation: 510 R Axis:   15 Text Interpretation: Sinus rhythm Low voltage, precordial leads Borderline repol abnormality, lateral leads Borderline ST elevation, anterior leads Borderline prolonged QT interval Baseline wander in lead(s) II V1 Confirmed by Madalyn Rob [phone]) on 03/02/2019 4:49:31 PM   Radiology DG Chest 2 View  Result Date: 03/02/2019 CLINICAL DATA:  Chest pain EXAM: CHEST - 2 VIEW COMPARISON:  02/11/2019 FINDINGS: Heart and mediastinal contours are within normal limits. No focal opacities or effusions. No acute bony abnormality. IMPRESSION: No active cardiopulmonary disease. Electronically Signed   By: Rolm Baptise M.D.   On: 03/02/2019 17:40    Procedures .Critical Care Performed by: Lucrezia Starch, MD Authorized by: Lucrezia Starch, MD   Critical care provider statement:    Critical care time (minutes):  35   Critical care was time spent personally by me on the following activities:  Discussions with consultants, evaluation of patient's response to treatment, examination of patient, ordering and performing treatments and interventions, ordering and review of laboratory studies, ordering and review of radiographic studies, pulse oximetry, re-evaluation of patient's condition, obtaining history from patient or surrogate and review of old charts   (including critical care time)  Medications Ordered in ED Medications - No data to display  ED Course  I have reviewed the triage vital signs and the nursing notes.  Pertinent labs & imaging results that were available during my care of the patient were reviewed by me and considered in my medical decision making (see chart for details).  Clinical Course as of Mar 01 2334  Thu Mar 02, 2019  2230 Discussed with cardiology, recommend hospitalist admission, trending enzymes, likely echocardiogram in a.m., will see as  consult and placement recommendations tonight   [RD]  2238 Discussed case with hospitalist, will admit   [RD]    Clinical Course User Index [RD] Lucrezia Starch, MD   MDM Rules/Calculators/A&P  54 year old lady ESRD on dialysis recent NSTEMI in December with multivessel coronary disease s/p 1 stent placement presenting to ER with chest pain.  Here well-appearing, pain significant improved after nitroglycerin.  EKG without ischemic changes.  Initial troponin however was elevated and second troponin was slightly more elevated.  Reviewed case with cardiology who recommended hospitalist admission, he will see in consultation and place final recommendations this evening.  Discussed case with hospitalist who will admit patient, will start heparin for possible ACS.  Received ASA prior to arrival.  Pain currently well controlled.   Final Clinical Impression(s) / ED Diagnoses Final diagnoses:  Chest pain, unspecified type  NSTEMI (non-ST elevated myocardial infarction) [facility])    Rx / DC Orders ED Discharge Orders    None       Lucrezia Starch, MD 03/02/19 2339

## 2019-03-02 NOTE — Consult Note (Signed)
Cardiology Consultation:   Patient ID: [name] MRN: [id number]; DOB: 10-15-1965  Admit date: 03/02/2019 Date of Consult: 03/02/2019  Primary Care Provider: [name], MD Primary Cardiologist: Skeet Latch, MD  Primary Electrophysiologist:  None    Patient Profile:   Brenda Davis is a 54 y.o. female with a hx of ESRD on dialysis, CAD s/p multiple prior PCI, HFpEF, DM, HLD, cirrhosis who is being seen today for the evaluation of chest pain at the request of Dr. Marthenia Rolling.  History of Present Illness:   Brenda Davis presents to the ED today with chest pain.  She states that her symptoms started yesterday morning.  When she first woke up she felt nauseous and short of breath.  Shortly thereafter she developed symptoms of chest pain and chest pressure.  These are the typical symptoms that she has developed in the past when she has had prior myocardial infarction.  Her symptoms waxed and waned over the course of the day yesterday.  She took a full dose aspirin yesterday and feels as though this helped her with her symptoms.  She slept overnight last night but earlier this morning her symptoms recurred and became more severe.  She had worsening nausea, diaphoresis, chest pain.  She again took nitroglycerin and aspirin and came to the emergency department for further evaluation.  Of note, the patient was very recently seen here for NSTEMI.  She underwent coronary angiography on December 29, which revealed severe multivessel coronary artery disease.  She underwent stent placement to her RCA and her OM1 with 0% residual stenosis.  She was discharged on DAPT and had been doing reasonably well until recently.  The patient states she has not missed any doses of her aspirin or ticagrelor.  She has not made any other recent changes to any of her medications.  She has had no recent changes to her diet.  She is had no fevers, chills.  In the emergency department,  the patient's initial vital signs were within reasonably normal limits.  Interpretation of her presenting ECG is limited by artifact but reveals no evidence of acute ST elevation but does have some nonspecific ST changes throughout.  Her initial troponin was found to be elevated at 119, increasing to 132 at 2 hours.  She was given full dose aspirin and admitted for further management.    The patient currently states she is chest pain-free.  She has been undergoing her normal dialysis sessions without any missed sessions recently.  Heart Pathway Score:     Past Medical History:  Diagnosis Date  . Anemia   . CAD (coronary artery disease)    a. cath in 02/2018 showing moderate 3-vessel CAD with 45% mid-LADm 55% LCx and 65% RCA stenosis which was not significant by FFR  . ESRD on dialysis ([place])   . Gout   . HCAP (healthcare-associated pneumonia) 05/06/2016  . History of blood transfusion    "related to surgery"  . Hyperlipidemia   . Hypertension   . Morbid obesity ([place])   . Pain    LEFT SHOULDER PAIN - WAS SEEN AT AN URGENT CARE - GIVEN SLING FOR COMFORT AND TOLD ROM AS TOLERATED.  Marland Kitchen Palpitations 09/24/2016  . Peritonitis, dialysis-associated ([place])   . Type II diabetes mellitus ([place])    "gastric sleeve OR corrected this" (05/06/2016)    Past Surgical History:  Procedure Laterality Date  . AV FISTULA PLACEMENT Left 01/03/2015   Procedure: BRACHIAL CEPHALIC ARTERIOVENOUS  FISTULA CREATION LEFT  ARM;  Surgeon: Angelia Mould, MD;  Location: [facility];  Service: Vascular;  Laterality: Left;  . CARPAL TUNNEL RELEASE Right   . [place]  . CHOLECYSTECTOMY  09/14/2017  . CORONARY STENT INTERVENTION N/A 01/17/2019   Procedure: CORONARY STENT INTERVENTION;  Surgeon: Jettie Booze, MD;  Location: [facility];  Service: Cardiovascular;  Laterality: N/A;  . ESOPHAGOGASTRODUODENOSCOPY N/A 09/04/2012   Procedure: ESOPHAGOGASTRODUODENOSCOPY (EGD);  Surgeon: Shann Medal, MD;  Location: [facility];  Service: General;  Laterality: N/A;  PF  . ESOPHAGOGASTRODUODENOSCOPY (EGD) WITH ESOPHAGEAL DILATION N/A 09/29/2012   Procedure: ESOPHAGOGASTRODUODENOSCOPY (EGD) WITH ESOPHAGEAL DILATION;  Surgeon: Milus Banister, MD;  Location: [facility];  Service: Endoscopy;  Laterality: N/A;  . FEMORAL ARTERY EXPLORATION Right 01/17/2019   Procedure: Exploration Right Groin with Primary Closure or Arteriotomy Site; Washout of Retroperitoneal Hematoma;  Surgeon: Waynetta Sandy, MD;  Location: [facility];  Service: Vascular;  Laterality: Right;  . INSERTION OF DIALYSIS CATHETER N/A 01/03/2015   Procedure: INSERTION OF DIALYSIS CATHETER RIGHT INTERNAL JUGULAR;  Surgeon: Angelia Mould, MD;  Location: [facility];  Service: Vascular;  Laterality: N/A;  . LAPAROSCOPIC GASTRIC SLEEVE RESECTION N/A 07/19/2012   Procedure: LAPAROSCOPIC SLEEVE GASTRECTOMY with EGD;  Surgeon: Madilyn Hook, DO;  Location: [facility];  Service: General;  Laterality: N/A;  laparoscopic sleeve gastrectomy with EGD  . LEFT HEART CATH AND CORONARY ANGIOGRAPHY N/A 02/25/2018   Procedure: LEFT HEART CATH AND CORONARY ANGIOGRAPHY;  Surgeon: Leonie Man, MD;  Location: [facility];  Service: Cardiovascular;  Laterality: N/A;  . LEFT HEART CATH AND CORONARY ANGIOGRAPHY N/A 06/20/2018   Procedure: LEFT HEART CATH AND CORONARY ANGIOGRAPHY;  Surgeon: Nelva Bush, MD;  Location: [facility];  Service: Cardiovascular;  Laterality: N/A;  . LEFT HEART CATH AND CORONARY ANGIOGRAPHY N/A 01/16/2019   Procedure: LEFT HEART CATH AND CORONARY ANGIOGRAPHY;  Surgeon: Jettie Booze, MD;  Location: [facility];  Service: Cardiovascular;  Laterality: N/A;  . PERIPHERAL VASCULAR CATHETERIZATION Left 05/23/2015   Procedure: Nolon Stalls;  Surgeon: Conrad Kahaluu-Keauhou, MD;  Location: [facility];  Service: Cardiovascular;  Laterality: Left;  upper aRM  . PERIPHERAL VASCULAR CATHETERIZATION Left  05/23/2015   Procedure: Peripheral Vascular Balloon Angioplasty;  Surgeon: Conrad Hinckley, MD;  Location: [facility];  Service: Cardiovascular;  Laterality: Left;  av fistula  . TUBAL LIGATION  1993  . UPPER GI ENDOSCOPY N/A 07/19/2012   Procedure: UPPER GI ENDOSCOPY;  Surgeon: Madilyn Hook, DO;  [facility];  Service: General;  Laterality: N/A;     Home Medications:  Prior to Admission medications   Medication Sig Start Date End Date Taking? Authorizing Provider  aspirin EC 81 MG EC tablet Take 1 tablet (81 mg total) by mouth daily. 02/26/18  Yes Smith, Rondell A, MD  calcium acetate (PHOSLO) 667 MG capsule Take 667 mg by mouth 3 (three) times daily with meals. 06/02/18  Yes [provider]  cinacalcet (SENSIPAR) 90 MG tablet Take 90 mg by mouth at bedtime. 12/08/18  Yes [provider]  Cyanocobalamin (B-12 PO) Take 1 tablet by mouth daily.   Yes [provider]  ferric citrate (AURYXIA) 1 GM 210 MG(Fe) tablet Take 2 tablets (420 mg total) by mouth 3 (three) times daily with meals. 01/23/19  Yes Daune Perch, NP  folic acid (FOLVITE) 1 MG tablet Take 1 tablet (1 mg total) by mouth daily.  03/08/18  Yes [name], MD  gabapentin (NEURONTIN) 100 MG capsule Take 100 mg by mouth 2 (two) times daily. 10/31/18  Yes [provider]  gentamicin cream (GARAMYCIN) 0.1 % Apply 1 application topically See admin instructions. Apply to exit site daily as directed. 04/26/17  Yes [provider]  nitroGLYCERIN (NITROSTAT) 0.4 MG SL tablet PLACE 1 TABLET (0.4 MG TOTAL) UNDER THE TONGUE EVERY 5 (FIVE) MINUTES AS NEEDED FOR CHEST PAIN. 08/01/18  Yes Skeet Latch, MD  ondansetron (ZOFRAN ODT) 8 MG disintegrating tablet Take 1 tablet (8 mg total) by mouth every 8 (eight) hours as needed for nausea or vomiting. 04/09/18  Yes Jola Schmidt, MD  rosuvastatin (CRESTOR) 10 MG tablet Take 1 tablet (10 mg total) by mouth daily. 02/01/19 05/02/19 Yes Cleaver, Jossie Ng, NP   ticagrelor (BRILINTA) 90 MG TABS tablet Take 1 tablet (90 mg total) by mouth 2 (two) times daily. 02/01/19 01/27/20 Yes Cleaver, Jossie Ng, NP  metFORMIN (GLUCOPHAGE) 500 MG tablet Take 1 tablet (500 mg total) by mouth 2 (two) times daily with a meal. Patient not taking: Reported on 03/02/2019 01/23/19 01/23/20  Daune Perch, NP    Inpatient Medications: Scheduled Meds:  Continuous Infusions:  PRN Meds:   Allergies:    Allergies  Allergen Reactions  . Amlodipine Swelling  . Atorvastatin     Elevated LFT's  . Clonidine Derivatives Swelling    Limbs swell  . Doxycycline Nausea And Vomiting    "I threw up for 3 hours"  . Welchol [Colesevelam Hcl] Nausea Only    Social History:   Social History   Socioeconomic History  . Marital status: Married    Spouse name: Not on file  . Number of children: 3  . Years of education: Not on file  . Highest education level: Not on file  Occupational History  . Occupation: Research scientist (physical sciences): [employer]  Tobacco Use  . Smoking status: Former Smoker    Packs/day: 1.00    Years: 16.00    Pack years: 16.00    Types: Cigarettes    Quit date: 2009    Years since quitting: 12.1  . Smokeless tobacco: Never Used  Substance and Sexual Activity  . Alcohol use: Never  . Drug use: No  . Sexual activity: Yes    Birth control/protection: Post-menopausal  Other Topics Concern  . Not on file  Social History Narrative  . Not on file   Social Determinants of Health   Financial Resource Strain:   . Difficulty of Paying Living Expenses: Not on file  Food Insecurity:   . Worried About Charity fundraiser in the Last Year: Not on file  . Ran Out of Food in the Last Year: Not on file  Transportation Needs:   . Lack of Transportation (Medical): Not on file  . Lack of Transportation (Non-Medical): Not on file  Physical Activity:   . Days of Exercise per Week: Not on file  . Minutes of Exercise per Session: Not on file  Stress:   . Feeling of  Stress : Not on file  Social Connections:   . Frequency of Communication with Friends and Family: Not on file  . Frequency of Social Gatherings with Friends and Family: Not on file  . Attends Religious Services: Not on file  . Active Member of Clubs or Organizations: Not on file  . Attends Archivist Meetings: Not on file  . Marital Status: Not on file  Intimate Partner Violence:   . Fear of Current or Ex-Partner: Not on file  . Emotionally Abused: Not on file  . Physically Abused: Not on file  . Sexually Abused: Not on file    Family History:   Family History  Problem Relation Age of Onset  . Hypertension Mother   . Mitral valve prolapse Mother   . Diabetes Father   . Arthritis Other   . Hyperlipidemia Other   . Cancer Neg Hx   . Heart disease Neg Hx   . Kidney disease Neg Hx   . Stroke Neg Hx      ROS:  Please see the history of present illness.  All other ROS reviewed and negative.     Physical Exam/Data:   Vitals:   03/02/19 2116 03/02/19 2130 03/02/19 2200 03/02/19 2230  BP: 97/61 118/81 117/81 (!) 101/55  Pulse: 82 85 85 81  Resp: 14 12 15 13   Temp:      TempSrc:      SpO2: 96% 98% 98% 97%  Weight:    125.6 kg  Height:    5' 5"  (1.651 m)   No intake or output data in the 24 hours ending 03/02/19 2359 Last 3 Weights 03/02/2019 02/17/2019 02/11/2019  Weight (lbs) 277 lb 277 lb 282 lb  Weight (kg) 125.646 kg 125.646 kg 127.914 kg     Body mass index is 46.1 kg/m.  General:  Well nourished, well developed, in no acute distress, obese, pleasant HEENT: normal Lymph: no adenopathy Neck: no JVD Endocrine:  No thryomegaly Vascular: No carotid bruits; FA pulses 2+ bilaterally without bruits  Cardiac:  normal S1, S2; RRR; no murmur, distant heart sounds Lungs:  clear to auscultation bilaterally, no wheezing, rhonchi or rales  Abd: soft, nontender, no hepatomegaly  Ext: no edema Musculoskeletal:  No deformities, BUE and BLE strength normal and  equal Skin: warm and dry  Neuro:  CNs 2-12 intact, no focal abnormalities noted Psych:  Normal affect   EKG:  The EKG was personally reviewed and demonstrates: Baseline artifact, normal sinus rhythm, low voltage, minimal nonspecific scattered ST changes  Relevant CV Studies: 01/17/19 LHC  Mid LAD lesion is 75% stenosed. The LAD is diffusely diseased and heavily calcified.  Mid Cx to Dist Cx lesion is 55% stenosed with 40% stenosed side branch in Ost 3rd Mrg.  Prox RCA lesion is 75% stenosed. This disease appears to have progressed.  Ost 1st Mrg-1 lesion is 50% stenosed.  Ost 1st Mrg-2 lesion is 99% stenosed. This lesion is significantly worse than prior.  The left ventricular systolic function is normal.  LV end diastolic pressure is normal.  The left ventricular ejection fraction is 55-65% by visual estimate.  There is no aortic valve stenosis.  Ost LAD to Mid LAD lesion is 40% stenosed.  01/16/19 echo 1. Left ventricular ejection fraction, by visual estimation, is 65 to  70%. The left ventricle has hyperdynamic function. There is moderately  increased left ventricular hypertrophy.  2. Definity contrast agent was given IV to delineate the left ventricular  endocardial borders.  3. Left ventricular diastolic parameters are consistent with Grade I  diastolic dysfunction (impaired relaxation).  4. The left ventricle has no regional wall motion abnormalities.  5. Global right ventricle has normal systolic function.The right  ventricular size is mildly enlarged. No increase in right ventricular wall  thickness.  6. Left atrial size was normal.  7. Right atrial size was normal.  8. The pericardial effusion is  posterior to the left ventricle.  9. Trivial pericardial effusion is present.  10. The mitral valve was not well visualized. Trivial mitral valve  regurgitation.  11. The tricuspid valve is not well visualized.  12. The aortic valve is tricuspid. Aortic valve  regurgitation is not  visualized. Mild aortic valve sclerosis without stenosis.  13. The pulmonic valve was grossly normal. Pulmonic valve regurgitation is  not visualized.  14. The inferior vena cava is normal in size with <50% respiratory  variability, suggesting right atrial pressure of 8 mmHg.  15. The interatrial septum was not well visualized.   Laboratory Data:  High Sensitivity Troponin:   Recent Labs  Lab 02/11/19 2122 02/11/19 2318 03/02/19 1720 03/02/19 1920  TROPONINIHS 11 11 119* 132*     Chemistry Recent Labs  Lab 03/02/19 1720  NA 139  K 3.3*  CL 97*  CO2 27  GLUCOSE 155*  BUN 46*  CREATININE 14.41*  CALCIUM 8.7*  GFRNONAA 3*  GFRAA 3*  ANIONGAP 15    No results for input(s): PROT, ALBUMIN, AST, ALT, ALKPHOS, BILITOT in the last 168 hours. Hematology Recent Labs  Lab 03/02/19 1720  WBC 9.8  RBC 3.78*  HGB 12.0  HCT 38.0  MCV 100.5*  MCH 31.7  MCHC 31.6  RDW 16.1*  PLT 479*   BNPNo results for input(s): BNP, PROBNP in the last 168 hours.  DDimer No results for input(s): DDIMER in the last 168 hours.   Radiology/Studies:  DG Chest 2 View  Result Date: 03/02/2019 CLINICAL DATA:  Chest pain EXAM: CHEST - 2 VIEW COMPARISON:  02/11/2019 FINDINGS: Heart and mediastinal contours are within normal limits. No focal opacities or effusions. No acute bony abnormality. IMPRESSION: No active cardiopulmonary disease. Electronically Signed   By: Rolm Baptise M.D.   On: 03/02/2019 17:40       TIMI Risk Score for Unstable Angina or Non-ST Elevation MI:   The patient's TIMI risk score is 5, which indicates a 26% risk of all cause mortality, new or recurrent myocardial infarction or need for urgent revascularization in the next 14 days.   Assessment and Plan:  Brenda Davis is a 54 y.o. female with a hx of ESRD on dialysis, CAD s/p multiple prior PCI, HFpEF, DM, HLD, cirrhosis who is being seen today for the evaluation of chest pain at the  request of Dr. Marthenia Rolling. She was found to have elevated troponin suggestive of NSTEMI.   #) Chest pain, CAD: Patient presenting with her classic symptoms of ischemic chest pain and elevated troponin diagnostic for NSTEMI.  She has known, severe, multivessel CAD.  She has been taking her dual antiplatelet therapy as recommended, thus acute stent thrombosis in this setting is unlikely.  She is currently chest pain-free.  Given the severity of the patient's coronary disease, it appears as though she was offered CABG in the recent past, however she preferred to have PCI and avoid cardiac surgery.  It may be worth revisiting this question with the patient again, although it is unclear to me whether her 75% LAD stenosis is hemodynamically significant. - recommend starting heparin drip for NSTEMI - ASA 14m daily - cont home ticagrelor 936mBID - cont home rosuvastatin - patient's LDL remains >110 despite statin, though she is only on moderate intensity statin; would be worth exploring whether she could tolerate high intensity statin or whether adding additional lipid lowering therapy would be an option - unable to tolerate beta blockers in past due to  hypotension, likely similar with long acting nitrates - NPO past midnight in case patient needs to go to cath lab     For questions or updates, please contact [facility] Please consult [email or web address] for contact info under     Signed, Marcie Mowers, MD  03/02/2019 11:59 PM

## 2019-03-02 NOTE — ED Triage Notes (Signed)
Pt began to have CP yesterday with pain rated 9/10 with n/v as well. Pt states that it was worse this morning, she took 2 nitro & aspirin 324 mg at home before EMS arrived on scene. She has a Hx of 3 stents the last time she had chest pain that felt this way back in December (per pt). Left arm fistula in place, now rates her CP 4/10, VSS, A/Ox4 upon arrival to ED.

## 2019-03-02 NOTE — H&P (Signed)
History and Physical  ELDANA ISIP PQD:[id number] DOB: April 30, 1965 DOA: 03/02/2019  Referring physician: ER provider PCP: [name], MD  Outpatient Specialists: Cardiology and nephrology Patient coming from: Home  Chief Complaint: Chest pain  HPI:  Patient is a 54 year old African-American female, morbidly obese, with past medical history significant for coronary artery disease with last cardiac catheterization done in June 2020 revealing moderate to severe multivessel coronary artery disease that was advised to be managed medically, end-stage renal disease initially on hemodialysis for 2 years and none on CCPD for the last 3 years, diabetes mellitus, PD catheter associated peritonitis, obesity, hypertension, hyperlipidemia and last CT scan revealed resolving retroperitoneal hematoma.  Patient presents with chest pain that started yesterday.  Chest pain is intermittent, sharp, rated 8 out of 10, associated with repeated nausea and vomiting.  Patient reports dyspnea on exertion and some shortness of breath.  No diaphoresis.  Troponin is elevated.  No headache, no neck pain, no fever or chills, no URI symptoms, no change in bowel habit, no diarrhea and no other reported constitutional symptoms.  ER provider has started patient on heparin drip for possible NSTEMI after discussing case with cardiology team.  Hospitalist team has been asked to admit patient for further assessment and management.  ED Course: On presentation to the hospital, temperature was 98.1, blood pressure of 101/55, heart rate of 81, respiratory rate of 13 and O2 sat of 97%.  Chemistry reveals sodium of 139, potassium of 3.3, chloride 97, CO2 27, BUN of 46 with serum creatinine of 14.41.  ER provider started patient on heparin drip.  Hospitalist team has been asked to admit patient.  Pertinent labs: As above.  EKG: Independently reviewed.   Imaging: independently reviewed.   Review of Systems:  Negative for  fever, visual changes, sore throat, rash, new muscle aches, bleeding.  Past Medical History:  Diagnosis Date  . Anemia   . CAD (coronary artery disease)    a. cath in 02/2018 showing moderate 3-vessel CAD with 45% mid-LADm 55% LCx and 65% RCA stenosis which was not significant by FFR  . ESRD on dialysis ([place])   . Gout   . HCAP (healthcare-associated pneumonia) 05/06/2016  . History of blood transfusion    "related to surgery"  . Hyperlipidemia   . Hypertension   . Morbid obesity ([place])   . Pain    LEFT SHOULDER PAIN - WAS SEEN AT AN URGENT CARE - GIVEN SLING FOR COMFORT AND TOLD ROM AS TOLERATED.  Marland Kitchen Palpitations 09/24/2016  . Peritonitis, dialysis-associated ([place])   . Type II diabetes mellitus ([place])    "gastric sleeve OR corrected this" (05/06/2016)    Past Surgical History:  Procedure Laterality Date  . AV FISTULA PLACEMENT Left 01/03/2015   Procedure: BRACHIAL CEPHALIC ARTERIOVENOUS  FISTULA CREATION LEFT ARM;  Surgeon: Angelia Mould, MD;  Location: [place];  Service: Vascular;  Laterality: Left;  . CARPAL TUNNEL RELEASE Right   . [place]  . CHOLECYSTECTOMY  09/14/2017  . CORONARY STENT INTERVENTION N/A 01/17/2019   Procedure: CORONARY STENT INTERVENTION;  Surgeon: Jettie Booze, MD;  Location: [place] [facility];  Service: Cardiovascular;  Laterality: N/A;  . ESOPHAGOGASTRODUODENOSCOPY N/A 09/04/2012   Procedure: ESOPHAGOGASTRODUODENOSCOPY (EGD);  Surgeon: Shann Medal, MD;  Location: [facility];  Service: General;  Laterality: N/A;  PF  . ESOPHAGOGASTRODUODENOSCOPY (EGD) WITH ESOPHAGEAL DILATION N/A 09/29/2012   Procedure: ESOPHAGOGASTRODUODENOSCOPY (EGD) WITH ESOPHAGEAL DILATION;  Surgeon: Milus Banister, MD;  Location: [facility];  Service: Endoscopy;  Laterality: N/A;  . FEMORAL ARTERY EXPLORATION Right 01/17/2019   Procedure: Exploration Right Groin with Primary Closure or Arteriotomy Site; Washout of Retroperitoneal Hematoma;  Surgeon:  Waynetta Sandy, MD;  Location: [place];  Service: Vascular;  Laterality: Right;  . INSERTION OF DIALYSIS CATHETER N/A 01/03/2015   Procedure: INSERTION OF DIALYSIS CATHETER RIGHT INTERNAL JUGULAR;  Surgeon: Angelia Mould, MD;  Location: [place];  Service: Vascular;  Laterality: N/A;  . LAPAROSCOPIC GASTRIC SLEEVE RESECTION N/A 07/19/2012   Procedure: LAPAROSCOPIC SLEEVE GASTRECTOMY with EGD;  Surgeon: Madilyn Hook, DO;  Location: [facility];  Service: General;  Laterality: N/A;  laparoscopic sleeve gastrectomy with EGD  . LEFT HEART CATH AND CORONARY ANGIOGRAPHY N/A 02/25/2018   Procedure: LEFT HEART CATH AND CORONARY ANGIOGRAPHY;  Surgeon: Leonie Man, MD;  Location: [place] [facility];  Service: Cardiovascular;  Laterality: N/A;  . LEFT HEART CATH AND CORONARY ANGIOGRAPHY N/A 06/20/2018   Procedure: LEFT HEART CATH AND CORONARY ANGIOGRAPHY;  Surgeon: Nelva Bush, MD;  Location: [place] [facility];  Service: Cardiovascular;  Laterality: N/A;  . LEFT HEART CATH AND CORONARY ANGIOGRAPHY N/A 01/16/2019   Procedure: LEFT HEART CATH AND CORONARY ANGIOGRAPHY;  Surgeon: Jettie Booze, MD;  Location: [place] [facility];  Service: Cardiovascular;  Laterality: N/A;  . PERIPHERAL VASCULAR CATHETERIZATION Left 05/23/2015   Procedure: Nolon Stalls;  Surgeon: Conrad Peck, MD;  Location: [place] [facility];  Service: Cardiovascular;  Laterality: Left;  upper aRM  . PERIPHERAL VASCULAR CATHETERIZATION Left 05/23/2015   Procedure: Peripheral Vascular Balloon Angioplasty;  Surgeon: Conrad Bishop, MD;  Location: [facility] [facility];  Service: Cardiovascular;  Laterality: Left;  av fistula  . TUBAL LIGATION  1993  . UPPER GI ENDOSCOPY N/A 07/19/2012   Procedure: UPPER GI ENDOSCOPY;  Surgeon: Madilyn Hook, DO;  Location: [facility];  Service: General;  Laterality: N/A;     reports that she quit smoking about 12 years ago. Her smoking use included cigarettes. She has a 16.00 pack-year smoking history. She  has never used smokeless tobacco. She reports that she does not drink alcohol or use drugs.  Allergies  Allergen Reactions  . Amlodipine Swelling  . Atorvastatin     Elevated LFT's  . Clonidine Derivatives Swelling    Limbs swell  . Doxycycline Nausea And Vomiting    "I threw up for 3 hours"  . Welchol [Colesevelam Hcl] Nausea Only    Family History  Problem Relation Age of Onset  . Hypertension Mother   . Mitral valve prolapse Mother   . Diabetes Father   . Arthritis Other   . Hyperlipidemia Other   . Cancer Neg Hx   . Heart disease Neg Hx   . Kidney disease Neg Hx   . Stroke Neg Hx      Prior to Admission medications   Medication Sig Start Date End Date Taking? Authorizing Provider  aspirin EC 81 MG EC tablet Take 1 tablet (81 mg total) by mouth daily. 02/26/18  Yes Smith, Rondell A, MD  calcium acetate (PHOSLO) 667 MG capsule Take 667 mg by mouth 3 (three) times daily with meals. 06/02/18  Yes [provider]  cinacalcet (SENSIPAR) 90 MG tablet Take 90 mg by mouth at bedtime. 12/08/18  Yes [provider]  Cyanocobalamin (B-12 PO) Take 1 tablet by mouth daily.   Yes [provider]  ferric citrate (AURYXIA) 1 GM 210 MG(Fe) tablet Take 2  tablets (420 mg total) by mouth 3 (three) times daily with meals. 01/23/19  Yes Daune Perch, NP  folic acid (FOLVITE) 1 MG tablet Take 1 tablet (1 mg total) by mouth daily. 03/08/18  Yes [name], MD  gabapentin (NEURONTIN) 100 MG capsule Take 100 mg by mouth 2 (two) times daily. 10/31/18  Yes [provider]  gentamicin cream (GARAMYCIN) 0.1 % Apply 1 application topically See admin instructions. Apply to exit site daily as directed. 04/26/17  Yes [provider]  nitroGLYCERIN (NITROSTAT) 0.4 MG SL tablet PLACE 1 TABLET (0.4 MG TOTAL) UNDER THE TONGUE EVERY 5 (FIVE) MINUTES AS NEEDED FOR CHEST PAIN. 08/01/18  Yes Skeet Latch, MD  ondansetron (ZOFRAN ODT) 8 MG disintegrating tablet Take 1  tablet (8 mg total) by mouth every 8 (eight) hours as needed for nausea or vomiting. 04/09/18  Yes Jola Schmidt, MD  rosuvastatin (CRESTOR) 10 MG tablet Take 1 tablet (10 mg total) by mouth daily. 02/01/19 05/02/19 Yes Cleaver, Jossie Ng, NP  ticagrelor (BRILINTA) 90 MG TABS tablet Take 1 tablet (90 mg total) by mouth 2 (two) times daily. 02/01/19 01/27/20 Yes Cleaver, Jossie Ng, NP  metFORMIN (GLUCOPHAGE) 500 MG tablet Take 1 tablet (500 mg total) by mouth 2 (two) times daily with a meal. Patient not taking: Reported on 03/02/2019 01/23/19 01/23/20  Daune Perch, NP    Physical Exam: Vitals:   03/02/19 2116 03/02/19 2130 03/02/19 2200 03/02/19 2230  BP: 97/61 118/81 117/81 (!) 101/55  Pulse: 82 85 85 81  Resp: 14 12 15 13   Temp:      TempSrc:      SpO2: 96% 98% 98% 97%  Weight:    125.6 kg  Height:    5' 5"  (1.651 m)    Constitutional:  . Appears calm and comfortable.  Patient is morbidly obese. Eyes:  . No pallor. No jaundice.  ENMT:  . external ears, nose appear normal Neck:  . Neck is supple. No JVD Respiratory:  . CTA bilaterally, no w/r/r.  . Respiratory effort normal. No retractions or accessory muscle use Cardiovascular:  . S1S2 . No LE extremity edema   Abdomen:  . Abdomen is obese, soft and non tender. Organs are difficult to assess. Neurologic:  . Awake and alert. . Moves all limbs.  Wt Readings from Last 3 Encounters:  03/02/19 125.6 kg  02/17/19 125.6 kg  02/11/19 127.9 kg    I have personally reviewed following labs and imaging studies  Labs on Admission:  CBC: Recent Labs  Lab 03/02/19 1720  WBC 9.8  NEUTROABS 7.0  HGB 12.0  HCT 38.0  MCV 100.5*  PLT 287*   Basic Metabolic Panel: Recent Labs  Lab 03/02/19 1720  NA 139  K 3.3*  CL 97*  CO2 27  GLUCOSE 155*  BUN 46*  CREATININE 14.41*  CALCIUM 8.7*   Liver Function Tests: No results for input(s): AST, ALT, ALKPHOS, BILITOT, PROT, ALBUMIN in the last 168 hours. No results for input(s):  LIPASE, AMYLASE in the last 168 hours. No results for input(s): AMMONIA in the last 168 hours. Coagulation Profile: No results for input(s): INR, PROTIME in the last 168 hours. Cardiac Enzymes: No results for input(s): CKTOTAL, CKMB, CKMBINDEX, TROPONINI in the last 168 hours. BNP (last 3 results) No results for input(s): PROBNP in the last 8760 hours. HbA1C: No results for input(s): HGBA1C in the last 72 hours. CBG: No results for input(s): GLUCAP in the last 168 hours. Lipid Profile: No  results for input(s): CHOL, HDL, LDLCALC, TRIG, CHOLHDL, LDLDIRECT in the last 72 hours. Thyroid Function Tests: No results for input(s): TSH, T4TOTAL, FREET4, T3FREE, THYROIDAB in the last 72 hours. Anemia Panel: No results for input(s): VITAMINB12, FOLATE, FERRITIN, TIBC, IRON, RETICCTPCT in the last 72 hours. Urine analysis:    Component Value Date/Time   COLORURINE COLORLESS (A) 04/18/2018 1900   APPEARANCEUR CLEAR 04/18/2018 1900   LABSPEC 1.005 04/18/2018 1900   PHURINE 8.0 04/18/2018 1900   GLUCOSEU >=500 (A) 04/18/2018 1900   GLUCOSEU >=1000 (A) 10/28/2017 0839   HGBUR SMALL (A) 04/18/2018 1900   HGBUR negative 09/17/2009 0000   BILIRUBINUR negative 11/23/2018 1125   KETONESUR NEGATIVE 04/18/2018 1900   PROTEINUR Positive (A) 11/23/2018 1125   PROTEINUR 30 (A) 04/18/2018 1900   UROBILINOGEN 0.2 11/23/2018 1125   UROBILINOGEN 0.2 10/28/2017 0839   NITRITE negative 11/23/2018 1125   NITRITE NEGATIVE 04/18/2018 1900   LEUKOCYTESUR Moderate (2+) (A) 11/23/2018 1125   LEUKOCYTESUR NEGATIVE 04/18/2018 1900   Sepsis Labs: @LABRCNTIP (procalcitonin:4,lacticidven:4) )No results found for this or any previous visit (from the past 240 hour(s)).    Radiological Exams on Admission: DG Chest 2 View  Result Date: 03/02/2019 CLINICAL DATA:  Chest pain EXAM: CHEST - 2 VIEW COMPARISON:  02/11/2019 FINDINGS: Heart and mediastinal contours are within normal limits. No focal opacities or effusions.  No acute bony abnormality. IMPRESSION: No active cardiopulmonary disease. Electronically Signed   By: Rolm Baptise M.D.   On: 03/02/2019 17:40    EKG: Independently reviewed.   Active Problems:   Chest pain   Assessment/Plan Chest pain/NSTEMI: -Admit patient -Patient has been started on heparin drip. -Monitor patient closely as there is history of retroperitoneal bleed. -Cardiology team is directing care. -Last cardiac catheterization done cubital 2020 revealed moderate to severe multivessel coronary artery disease with medical treatment recommended.  ESRD on peritoneal dialysis: -Patient is on the cycler at nighttime, reports using 2 2.5% and one 1.5% bags. -Consult nephrology team  Diabetes mellitus: Monitor blood sugar closely. Sliding scale insulin coverage.  Morbid obesity: Further management on outpatient basis.  Hyperlipidemia: Continue current medication.  Hypertension: Optimized. Continue current medical history.  DVT prophylaxis: Patient is on heparin drip.  Monitor closely due to history of retroperitoneal bleed. Code Status: Full code Family Communication:  Disposition Plan: Home eventually Consults called: Has consulted cardiology team Admission status: Observation  Time spent: 65 minutes  Dana Allan, MD  [facility] Pager #: [phone] 7PM-7AM contact night coverage as above  03/02/2019, 11:38 PM

## 2019-03-02 NOTE — Progress Notes (Signed)
ANTICOAGULATION CONSULT NOTE - Initial Consult  Pharmacy Consult for Heparin Indication: chest pain/ACS  Allergies  Allergen Reactions  . Amlodipine Swelling  . Atorvastatin     Elevated LFT's  . Clonidine Derivatives Swelling    Limbs swell  . Doxycycline Nausea And Vomiting    "I threw up for 3 hours"  . Welchol [Colesevelam Hcl] Nausea Only    Patient Measurements: Height: 5' 5"  (165.1 cm) Weight: 277 lb (125.6 kg) IBW/kg (Calculated) : 57 Heparin Dosing Weight: 87.6kg  Vital Signs: Temp: 98.1 F (36.7 C) (02/11 1644) Temp Source: Oral (02/11 1644) BP: 101/55 (02/11 2230) Pulse Rate: 81 (02/11 2230)  Labs: Recent Labs    03/02/19 1720 03/02/19 1920  HGB 12.0  --   HCT 38.0  --   PLT 479*  --   CREATININE 14.41*  --   TROPONINIHS 119* 132*    Estimated Creatinine Clearance: 5.9 mL/min (A) (by C-G formula based on SCr of 14.41 mg/dL (H)).   Medical History: Past Medical History:  Diagnosis Date  . Anemia   . CAD (coronary artery disease)    a. cath in 02/2018 showing moderate 3-vessel CAD with 45% mid-LADm 55% LCx and 65% RCA stenosis which was not significant by FFR  . ESRD on dialysis ([place])   . Gout   . HCAP (healthcare-associated pneumonia) 05/06/2016  . History of blood transfusion    "related to surgery"  . Hyperlipidemia   . Hypertension   . Morbid obesity ([place])   . Pain    LEFT SHOULDER PAIN - WAS SEEN AT AN URGENT CARE - GIVEN SLING FOR COMFORT AND TOLD ROM AS TOLERATED.  Marland Kitchen Palpitations 09/24/2016  . Peritonitis, dialysis-associated ([place])   . Type II diabetes mellitus ([employer])    "gastric sleeve OR corrected this" (05/06/2016)    Medications:  Scheduled:   Assessment: Patient is a 22 yom that presents to the ED with chest pain. The patient has a hx of CAD, ESRD, HTN, DM, and anemia. Patients Trop has been trending up at this time. Pharmacy has been asked to dose heparin for ACS.  Goal of Therapy:  Heparin level 0.3-0.7 units/ml Monitor  platelets by anticoagulation protocol: Yes   Plan:  - Heparin 4000 units IV x 1 dose  - Heparin drip @ 1000 units/hr  - Heparin level with AM labs  - Monitor patient for s/s of bleeding and CBC while on heparin    Duanne Limerick PharmD. BCPS  03/02/2019,10:43 PM

## 2019-03-02 NOTE — ED Notes (Signed)
EDP made aware of Critical troponin level: 119.

## 2019-03-03 ENCOUNTER — Encounter (HOSPITAL_COMMUNITY)
Admission: EM | Disposition: A | Discharge status: Home Health | Payer: Self-pay | Source: Home / Self Care | Attending: Internal Medicine

## 2019-03-03 ENCOUNTER — Ambulatory Visit: Payer: BC Managed Care – PPO

## 2019-03-03 DIAGNOSIS — U071 COVID-19: Secondary | ICD-10-CM | POA: Diagnosis present

## 2019-03-03 DIAGNOSIS — N2581 Secondary hyperparathyroidism of renal origin: Secondary | ICD-10-CM | POA: Diagnosis present

## 2019-03-03 DIAGNOSIS — J159 Unspecified bacterial pneumonia: Secondary | ICD-10-CM | POA: Diagnosis not present

## 2019-03-03 DIAGNOSIS — I200 Unstable angina: Secondary | ICD-10-CM | POA: Diagnosis not present

## 2019-03-03 DIAGNOSIS — A4189 Other specified sepsis: Secondary | ICD-10-CM | POA: Diagnosis not present

## 2019-03-03 DIAGNOSIS — I132 Hypertensive heart and chronic kidney disease with heart failure and with stage 5 chronic kidney disease, or end stage renal disease: Secondary | ICD-10-CM | POA: Diagnosis present

## 2019-03-03 DIAGNOSIS — Z6841 Body Mass Index (BMI) 40.0 and over, adult: Secondary | ICD-10-CM

## 2019-03-03 DIAGNOSIS — Z9049 Acquired absence of other specified parts of digestive tract: Secondary | ICD-10-CM | POA: Diagnosis not present

## 2019-03-03 DIAGNOSIS — I5032 Chronic diastolic (congestive) heart failure: Secondary | ICD-10-CM | POA: Diagnosis present

## 2019-03-03 DIAGNOSIS — T82867A Thrombosis of cardiac prosthetic devices, implants and grafts, initial encounter: Secondary | ICD-10-CM | POA: Diagnosis present

## 2019-03-03 DIAGNOSIS — J1282 Pneumonia due to coronavirus disease 2019: Secondary | ICD-10-CM | POA: Diagnosis not present

## 2019-03-03 DIAGNOSIS — Z87891 Personal history of nicotine dependence: Secondary | ICD-10-CM | POA: Diagnosis not present

## 2019-03-03 DIAGNOSIS — I25110 Atherosclerotic heart disease of native coronary artery with unstable angina pectoris: Secondary | ICD-10-CM

## 2019-03-03 DIAGNOSIS — N186 End stage renal disease: Secondary | ICD-10-CM | POA: Diagnosis present

## 2019-03-03 DIAGNOSIS — E1122 Type 2 diabetes mellitus with diabetic chronic kidney disease: Secondary | ICD-10-CM | POA: Diagnosis present

## 2019-03-03 DIAGNOSIS — Y712 Prosthetic and other implants, materials and accessory cardiovascular devices associated with adverse incidents: Secondary | ICD-10-CM | POA: Diagnosis present

## 2019-03-03 DIAGNOSIS — Z888 Allergy status to other drugs, medicaments and biological substances status: Secondary | ICD-10-CM | POA: Diagnosis not present

## 2019-03-03 DIAGNOSIS — E871 Hypo-osmolality and hyponatremia: Secondary | ICD-10-CM | POA: Diagnosis not present

## 2019-03-03 DIAGNOSIS — R778 Other specified abnormalities of plasma proteins: Secondary | ICD-10-CM | POA: Diagnosis not present

## 2019-03-03 DIAGNOSIS — Z992 Dependence on renal dialysis: Secondary | ICD-10-CM

## 2019-03-03 DIAGNOSIS — Z833 Family history of diabetes mellitus: Secondary | ICD-10-CM | POA: Diagnosis not present

## 2019-03-03 DIAGNOSIS — D638 Anemia in other chronic diseases classified elsewhere: Secondary | ICD-10-CM | POA: Diagnosis not present

## 2019-03-03 DIAGNOSIS — I214 Non-ST elevation (NSTEMI) myocardial infarction: Secondary | ICD-10-CM | POA: Diagnosis present

## 2019-03-03 DIAGNOSIS — E785 Hyperlipidemia, unspecified: Secondary | ICD-10-CM | POA: Diagnosis present

## 2019-03-03 DIAGNOSIS — I2584 Coronary atherosclerosis due to calcified coronary lesion: Secondary | ICD-10-CM | POA: Diagnosis present

## 2019-03-03 DIAGNOSIS — Z8249 Family history of ischemic heart disease and other diseases of the circulatory system: Secondary | ICD-10-CM | POA: Diagnosis not present

## 2019-03-03 DIAGNOSIS — Z955 Presence of coronary angioplasty implant and graft: Secondary | ICD-10-CM | POA: Diagnosis not present

## 2019-03-03 HISTORY — PX: LEFT HEART CATH AND CORONARY ANGIOGRAPHY: CATH118249

## 2019-03-03 LAB — CBC
HCT: 34 % — ABNORMAL LOW (ref 36.0–46.0)
Hemoglobin: 10.9 g/dL — ABNORMAL LOW (ref 12.0–15.0)
MCH: 31.6 pg (ref 26.0–34.0)
MCHC: 32.1 g/dL (ref 30.0–36.0)
MCV: 98.6 fL (ref 80.0–100.0)
Platelets: 412 10*3/uL — ABNORMAL HIGH (ref 150–400)
RBC: 3.45 MIL/uL — ABNORMAL LOW (ref 3.87–5.11)
RDW: 15.9 % — ABNORMAL HIGH (ref 11.5–15.5)
WBC: 9.6 10*3/uL (ref 4.0–10.5)
nRBC: 0 % (ref 0.0–0.2)

## 2019-03-03 LAB — GLUCOSE, CAPILLARY
Glucose-Capillary: 98 mg/dL (ref 70–99)
Glucose-Capillary: 137 mg/dL — ABNORMAL HIGH (ref 70–99)
Glucose-Capillary: 124 mg/dL — ABNORMAL HIGH (ref 70–99)

## 2019-03-03 LAB — BASIC METABOLIC PANEL
Anion gap: 16 — ABNORMAL HIGH (ref 5–15)
BUN: 49 mg/dL — ABNORMAL HIGH (ref 6–20)
CO2: 26 mmol/L (ref 22–32)
Calcium: 8.2 mg/dL — ABNORMAL LOW (ref 8.9–10.3)
Chloride: 99 mmol/L (ref 98–111)
Creatinine, Ser: 15.56 mg/dL — ABNORMAL HIGH (ref 0.44–1.00)
GFR calc Af Amer: 3 mL/min — ABNORMAL LOW (ref >60)
GFR calc non Af Amer: 2 mL/min — ABNORMAL LOW (ref >60)
Glucose, Bld: 98 mg/dL (ref 70–99)
Potassium: 3.2 mmol/L — ABNORMAL LOW (ref 3.5–5.1)
Sodium: 141 mmol/L (ref 135–145)

## 2019-03-03 LAB — HEPARIN LEVEL (UNFRACTIONATED)
Heparin Unfractionated: 0.55 IU/mL (ref 0.30–0.70)
Heparin Unfractionated: 0.5 IU/mL (ref 0.30–0.70)

## 2019-03-03 LAB — SARS CORONAVIRUS 2 (TAT 6-24 HRS): SARS Coronavirus 2: NEGATIVE

## 2019-03-03 LAB — MAGNESIUM: Magnesium: 1.8 mg/dL (ref 1.7–2.4)

## 2019-03-03 LAB — PHOSPHORUS: Phosphorus: 6.2 mg/dL — ABNORMAL HIGH (ref 2.5–4.6)

## 2019-03-03 SURGERY — LEFT HEART CATH AND CORONARY ANGIOGRAPHY
Anesthesia: LOCAL

## 2019-03-03 MED ORDER — SODIUM CHLORIDE 0.9 % IV SOLN
INTRAVENOUS | Status: AC | PRN
  Administered 2019-03-03: 10 mL/h via INTRAVENOUS

## 2019-03-03 MED ORDER — NITROGLYCERIN 0.4 MG SL SUBL
0.4000 mg | SUBLINGUAL_TABLET | SUBLINGUAL | Status: AC | PRN
  Administered 2019-03-03 (×2): 0.4 mg via SUBLINGUAL
  Filled 2019-03-03: qty 1

## 2019-03-03 MED ORDER — HEPARIN (PORCINE) IN NACL 1000-0.9 UT/500ML-% IV SOLN
INTRAVENOUS | Status: DC | PRN
  Administered 2019-03-03: 500 mL

## 2019-03-03 MED ORDER — LIDOCAINE HCL (PF) 1 % IJ SOLN
INTRAMUSCULAR | Status: DC | PRN
  Administered 2019-03-03: 2 mL

## 2019-03-03 MED ORDER — VERAPAMIL HCL 2.5 MG/ML IV SOLN
INTRAVENOUS | Status: DC | PRN
  Administered 2019-03-03: 10 mL via INTRA_ARTERIAL

## 2019-03-03 MED ORDER — NITROGLYCERIN 0.4 MG SL SUBL
SUBLINGUAL_TABLET | SUBLINGUAL | Status: AC
  Administered 2019-03-03: 0.4 mg via SUBLINGUAL
  Filled 2019-03-03: qty 1

## 2019-03-03 MED ORDER — ASPIRIN EC 81 MG PO TBEC
81.0000 mg | DELAYED_RELEASE_TABLET | Freq: Every day | ORAL | Status: DC
  Administered 2019-03-03 – 2019-03-14 (×12): 81 mg via ORAL
  Filled 2019-03-03 (×12): qty 1

## 2019-03-03 MED ORDER — MIDAZOLAM HCL 2 MG/2ML IJ SOLN
INTRAMUSCULAR | Status: AC
  Filled 2019-03-03: qty 2

## 2019-03-03 MED ORDER — VITAMIN B-12 1000 MCG PO TABS
1000.0000 ug | ORAL_TABLET | Freq: Every day | ORAL | Status: DC
  Administered 2019-03-04 – 2019-03-14 (×11): 1000 ug via ORAL
  Filled 2019-03-03 (×12): qty 1

## 2019-03-03 MED ORDER — GABAPENTIN 100 MG PO CAPS
100.0000 mg | ORAL_CAPSULE | Freq: Two times a day (BID) | ORAL | Status: DC
  Administered 2019-03-03 – 2019-03-08 (×11): 100 mg via ORAL
  Filled 2019-03-03 (×12): qty 1

## 2019-03-03 MED ORDER — CALCIUM ACETATE (PHOS BINDER) 667 MG PO CAPS
667.0000 mg | ORAL_CAPSULE | Freq: Three times a day (TID) | ORAL | Status: DC
  Administered 2019-03-03 – 2019-03-07 (×9): 667 mg via ORAL
  Filled 2019-03-03 (×10): qty 1

## 2019-03-03 MED ORDER — FENTANYL CITRATE (PF) 100 MCG/2ML IJ SOLN
INTRAMUSCULAR | Status: AC
  Filled 2019-03-03: qty 2

## 2019-03-03 MED ORDER — HYDROCODONE-ACETAMINOPHEN 5-325 MG PO TABS
1.0000 | ORAL_TABLET | Freq: Four times a day (QID) | ORAL | Status: DC | PRN
  Administered 2019-03-03 – 2019-03-13 (×4): 1 via ORAL
  Filled 2019-03-03 (×4): qty 1

## 2019-03-03 MED ORDER — SODIUM CHLORIDE 0.9 % IV SOLN: 250.0000 mL | INTRAVENOUS | Status: DC | PRN

## 2019-03-03 MED ORDER — INSULIN ASPART 100 UNIT/ML ~~LOC~~ SOLN
0.0000 [IU] | Freq: Three times a day (TID) | SUBCUTANEOUS | Status: DC
  Administered 2019-03-05 – 2019-03-10 (×2): 1 [IU] via SUBCUTANEOUS
  Administered 2019-03-10 – 2019-03-11 (×2): 2 [IU] via SUBCUTANEOUS
  Administered 2019-03-11: 3 [IU] via SUBCUTANEOUS
  Administered 2019-03-11: 2 [IU] via SUBCUTANEOUS
  Administered 2019-03-12: 3 [IU] via SUBCUTANEOUS
  Administered 2019-03-12: 2 [IU] via SUBCUTANEOUS
  Administered 2019-03-12 – 2019-03-13 (×2): 3 [IU] via SUBCUTANEOUS
  Administered 2019-03-13 (×2): 1 [IU] via SUBCUTANEOUS
  Administered 2019-03-14: 2 [IU] via SUBCUTANEOUS

## 2019-03-03 MED ORDER — VERAPAMIL HCL 2.5 MG/ML IV SOLN
INTRAVENOUS | Status: AC
  Filled 2019-03-03: qty 2

## 2019-03-03 MED ORDER — FOLIC ACID 1 MG PO TABS
1.0000 mg | ORAL_TABLET | Freq: Every day | ORAL | Status: DC
  Administered 2019-03-04 – 2019-03-14 (×11): 1 mg via ORAL
  Filled 2019-03-03 (×12): qty 1

## 2019-03-03 MED ORDER — ACETAMINOPHEN 325 MG PO TABS
650.0000 mg | ORAL_TABLET | Freq: Four times a day (QID) | ORAL | Status: DC | PRN
  Administered 2019-03-03 – 2019-03-07 (×4): 650 mg via ORAL
  Filled 2019-03-03 (×4): qty 2

## 2019-03-03 MED ORDER — SODIUM CHLORIDE 0.9% FLUSH: 3.0000 mL | INTRAVENOUS | Status: DC | PRN

## 2019-03-03 MED ORDER — HEPARIN (PORCINE) IN NACL 1000-0.9 UT/500ML-% IV SOLN
INTRAVENOUS | Status: AC
  Filled 2019-03-03: qty 1000

## 2019-03-03 MED ORDER — GENTAMICIN SULFATE 0.1 % EX CREA
1.0000 "application " | TOPICAL_CREAM | Freq: Every day | CUTANEOUS | Status: DC
  Administered 2019-03-04 – 2019-03-13 (×13): 1 via TOPICAL
  Filled 2019-03-03 (×2): qty 15

## 2019-03-03 MED ORDER — HEPARIN 1000 UNIT/ML FOR PERITONEAL DIALYSIS
INTRAPERITONEAL | Status: DC | PRN
  Filled 2019-03-03: qty 5000

## 2019-03-03 MED ORDER — POTASSIUM CHLORIDE CRYS ER 20 MEQ PO TBCR
40.0000 meq | EXTENDED_RELEASE_TABLET | Freq: Once | ORAL | Status: AC
  Administered 2019-03-03: 40 meq via ORAL
  Filled 2019-03-03: qty 2

## 2019-03-03 MED ORDER — GENTAMICIN SULFATE 0.1 % EX CREA
1.0000 "application " | TOPICAL_CREAM | Freq: Every day | CUTANEOUS | Status: DC | PRN
  Administered 2019-03-05 – 2019-03-12 (×2): 1 via TOPICAL
  Filled 2019-03-03: qty 15

## 2019-03-03 MED ORDER — MIDAZOLAM HCL 2 MG/2ML IJ SOLN
INTRAMUSCULAR | Status: DC | PRN
  Administered 2019-03-03: 1 mg via INTRAVENOUS

## 2019-03-03 MED ORDER — LIDOCAINE HCL (PF) 1 % IJ SOLN
INTRAMUSCULAR | Status: AC
  Filled 2019-03-03: qty 30

## 2019-03-03 MED ORDER — HEPARIN (PORCINE) 25000 UT/250ML-% IV SOLN
1000.0000 [IU]/h | INTRAVENOUS | Status: DC
  Administered 2019-03-03 – 2019-03-04 (×2): 1000 [IU]/h via INTRAVENOUS
  Filled 2019-03-03 (×2): qty 250

## 2019-03-03 MED ORDER — HEPARIN 1000 UNIT/ML FOR PERITONEAL DIALYSIS: 500.0000 [IU] | INTRAMUSCULAR | Status: DC | PRN

## 2019-03-03 MED ORDER — HEPARIN SODIUM (PORCINE) 1000 UNIT/ML IJ SOLN
INTRAMUSCULAR | Status: AC
  Filled 2019-03-03: qty 1

## 2019-03-03 MED ORDER — SODIUM CHLORIDE 0.9% FLUSH: 3.0000 mL | Freq: Two times a day (BID) | INTRAVENOUS | Status: DC

## 2019-03-03 MED ORDER — ONDANSETRON HCL 4 MG/2ML IJ SOLN
4.0000 mg | Freq: Four times a day (QID) | INTRAMUSCULAR | Status: DC | PRN
  Administered 2019-03-05 – 2019-03-11 (×7): 4 mg via INTRAVENOUS
  Filled 2019-03-03 (×8): qty 2

## 2019-03-03 MED ORDER — FERRIC CITRATE 1 GM 210 MG(FE) PO TABS
420.0000 mg | ORAL_TABLET | Freq: Three times a day (TID) | ORAL | Status: DC
  Administered 2019-03-03 – 2019-03-14 (×16): 420 mg via ORAL
  Filled 2019-03-03 (×33): qty 2

## 2019-03-03 MED ORDER — CINACALCET HCL 30 MG PO TABS
90.0000 mg | ORAL_TABLET | Freq: Every day | ORAL | Status: DC
  Administered 2019-03-03 – 2019-03-06 (×5): 90 mg via ORAL
  Filled 2019-03-03 (×6): qty 3

## 2019-03-03 MED ORDER — ROSUVASTATIN CALCIUM 5 MG PO TABS
10.0000 mg | ORAL_TABLET | Freq: Every day | ORAL | Status: DC
  Administered 2019-03-03 – 2019-03-14 (×12): 10 mg via ORAL
  Filled 2019-03-03 (×12): qty 2

## 2019-03-03 MED ORDER — SODIUM CHLORIDE 0.9 % IV SOLN: INTRAVENOUS | Status: DC

## 2019-03-03 MED ORDER — HEPARIN SODIUM (PORCINE) 1000 UNIT/ML IJ SOLN
INTRAMUSCULAR | Status: DC | PRN
  Administered 2019-03-03: 6000 [IU] via INTRAVENOUS

## 2019-03-03 MED ORDER — FENTANYL CITRATE (PF) 100 MCG/2ML IJ SOLN
INTRAMUSCULAR | Status: DC | PRN
  Administered 2019-03-03: 25 ug via INTRAVENOUS

## 2019-03-03 MED ORDER — IOHEXOL 350 MG/ML SOLN
INTRAVENOUS | Status: DC | PRN
  Administered 2019-03-03: 30 mL

## 2019-03-03 MED ORDER — TICAGRELOR 90 MG PO TABS
90.0000 mg | ORAL_TABLET | Freq: Two times a day (BID) | ORAL | Status: DC
  Administered 2019-03-03 – 2019-03-14 (×24): 90 mg via ORAL
  Filled 2019-03-03 (×25): qty 1

## 2019-03-03 SURGICAL SUPPLY — 11 items
CATH 5FR JL3.5 JR4 ANG PIG MP (CATHETERS) ×1 IMPLANT
DEVICE RAD COMP TR BAND LRG (VASCULAR PRODUCTS) ×1 IMPLANT
GLIDESHEATH SLEND SS 6F .021 (SHEATH) ×1 IMPLANT
GUIDEWIRE INQWIRE 1.5J.035X260 (WIRE) IMPLANT
HOVERMATT SINGLE USE (MISCELLANEOUS) ×1 IMPLANT
INQWIRE 1.5J .035X260CM (WIRE) ×2
KIT HEART LEFT (KITS) ×2 IMPLANT
PACK CARDIAC CATHETERIZATION (CUSTOM PROCEDURE TRAY) ×2 IMPLANT
SHEATH PROBE COVER 6X72 (BAG) ×1 IMPLANT
TRANSDUCER W/STOPCOCK (MISCELLANEOUS) ×2 IMPLANT
TUBING CIL FLEX 10 FLL-RA (TUBING) ×2 IMPLANT

## 2019-03-03 NOTE — Progress Notes (Addendum)
The patient has been seen in conjunction with Roby Lofts, PA-C. All aspects of care have been considered and discussed. The patient has been personally interviewed, examined, and all clinical data has been reviewed.   Spoke to the patient and to nephrology.  Given recurrent discomfort similar to pre-PCI (atypical features) repeat coronary angiography is recommended.  Still has a healing ulcer in the right femoral.  I discussed both with the patient and nephrology using the right radial approach although we tried a avoid using the arm and end-stage renal disease.  Dr. Melvia Heaps is aware and feels that radial approach is appropriate if that is what we recommend.  Also has the patient set to undergo peritoneal dialysis this evening.  She missed dialysis yesterday.  The patient was counseled to undergo left heart catheterization, coronary angiography, and possible percutaneous coronary intervention with stent implantation. The procedural risks and benefits were discussed in detail. The risks discussed included death, stroke, myocardial infarction, life-threatening bleeding, limb ischemia, kidney injury, allergy, and possible emergency cardiac surgery. The risk of these significant complications were estimated to occur less than 1% of the time. After discussion, the patient has agreed to proceed.    Progress Note  Patient Name: Brenda Davis Date of Encounter: 03/03/2019  Primary Cardiologist: Skeet Latch, MD   Subjective   Still with some chest pain this morning. Reports this feels similar to prior MI. Reports compliance with DAPT. Has not missed any PD sessions. She still has an open wound from [facility] 01/16/2019.   Inpatient Medications    Scheduled Meds: . aspirin EC  81 mg Oral Daily  . calcium acetate  667 mg Oral TID WC  . cinacalcet  90 mg Oral QHS  . ferric citrate  420 mg Oral TID WC  . folic acid  1 mg Oral Daily  . gabapentin  100 mg Oral BID  . insulin aspart   0-6 Units Subcutaneous TID WC  . rosuvastatin  10 mg Oral Daily  . ticagrelor  90 mg Oral BID  . vitamin B-12  1,000 mcg Oral Daily   Continuous Infusions: . sodium chloride    . heparin 1,000 Units/hr (03/02/19 2325)   PRN Meds: acetaminophen, gentamicin cream, nitroGLYCERIN, ondansetron (ZOFRAN) IV   Vital Signs    Vitals:   03/03/19 0230 03/03/19 0331 03/03/19 0334 03/03/19 0423  BP: 136/84  132/82 (!) 146/83  Pulse: 78  78   Resp: 12  20   Temp:   98 F (36.7 C)   TempSrc:   Oral   SpO2: 96%  100%   Weight:  124.7 kg    Height:  5' 5"  (1.651 m)     No intake or output data in the 24 hours ending 03/03/19 0804 Filed Weights   03/02/19 2230 03/03/19 0331  Weight: 125.6 kg 124.7 kg    Telemetry    NSR - Personally Reviewed  ECG    NSR, no STE/D, no TWI, rate 78 bpm, QTc 513 - Personally Reviewed  Physical Exam   GEN: Laying in bed appearing mildly uncomfortable. Seems somnolent.    Neck: No JVD, no carotid bruits Cardiac: RRR, no murmurs, rubs, or gallops. Right groin cath site still with open wound though no surrounding erythema, no pus drainage Respiratory: Clear to auscultation bilaterally, no wheezes/ rales/ rhonchi GI: NABS, Soft, nontender, non-distended  MS: No edema; No deformity. Neuro:  Nonfocal, moving all extremities spontaneously Psych: Normal affect   Labs    Chemistry Recent  Labs  Lab 03/02/19 1720  NA 139  K 3.3*  CL 97*  CO2 27  GLUCOSE 155*  BUN 46*  CREATININE 14.41*  CALCIUM 8.7*  GFRNONAA 3*  GFRAA 3*  ANIONGAP 15     Hematology Recent Labs  Lab 03/02/19 1720 03/03/19 0622  WBC 9.8 9.6  RBC 3.78* 3.45*  HGB 12.0 10.9*  HCT 38.0 34.0*  MCV 100.5* 98.6  MCH 31.7 31.6  MCHC 31.6 32.1  RDW 16.1* 15.9*  PLT 479* 412*    Cardiac EnzymesNo results for input(s): TROPONINI in the last 168 hours. No results for input(s): TROPIPOC in the last 168 hours.   BNPNo results for input(s): BNP, PROBNP in the last 168 hours.     DDimer No results for input(s): DDIMER in the last 168 hours.   Radiology    DG Chest 2 View  Result Date: 03/02/2019 CLINICAL DATA:  Chest pain EXAM: CHEST - 2 VIEW COMPARISON:  02/11/2019 FINDINGS: Heart and mediastinal contours are within normal limits. No focal opacities or effusions. No acute bony abnormality. IMPRESSION: No active cardiopulmonary disease. Electronically Signed   By: Rolm Baptise M.D.   On: 03/02/2019 17:40    Cardiac Studies   Left heart catheterization 01/16/2019:  Mid LAD lesion is 75% stenosed. The LAD is diffusely diseased and heavily calcified.  Mid Cx to Dist Cx lesion is 55% stenosed with 40% stenosed side branch in Ost 3rd Mrg.  Prox RCA lesion is 75% stenosed. This disease appears to have progressed.  Ost 1st Mrg-1 lesion is 50% stenosed.  Ost 1st Mrg-2 lesion is 99% stenosed. This lesion is significantly worse than prior.  The left ventricular systolic function is normal.  LV end diastolic pressure is normal.  The left ventricular ejection fraction is 55-65% by visual estimate.  There is no aortic valve stenosis.  Ost LAD to Mid LAD lesion is 40% stenosed.   Severe multivessel disease with progression of calcific disease in the mid LAD, proximal RCA and tortuous mid OM1.    Plan for surgical consult.    Coronary stent intervention 01/17/2019:  Mid LAD lesion is 75% stenosed. The LAD was not addressed and will be managed medically.  Ost LAD to Mid LAD lesion is 40% stenosed.  Mid Cx to Dist Cx lesion is 55% stenosed with 40% stenosed side branch in Ost 3rd Mrg.  Prox RCA lesion is 75% stenosed.  A drug-eluting stent was successfully placed using a SYNERGY XD 3.0X38, postdilated to 3.5 mm.  Post intervention, there is a 0% residual stenosis.  Ost 1st Mrg lesion is 99% stenosed. EBU4 Guide catheter used.  A drug-eluting stent was successfully placed using a SYNERGY XD 2.25X24, postdilated to 2.75 mm.  Post intervention, there  is a 0% residual stenosis.   Continue DAPT for 12 months.  Would recommend clopidogrel long-term after one year since she has such diffuse disease.  Continue aggressive secondary prevention.    Patient Profile     54 y.o. female with PMH of severe multivessel CAD s/p PCI/DES to RCA and OM1 81/19/1478, chronic diastolic CHF, HTN, HLD, DM type 2, who is being followed by cardiology for the evaluation of chest pain.   Assessment & Plan    1.Unstable angina in patient with CAD s/p recent PCI/DES to RCA and OM1 01/17/2019: patient presented with intermittent chest pain/pressure similar to prior MI. EKG was non-ischemic. Trop 295>621. She had recurrent chest pain this morning, though no dynamic changes on repeat EKG. She  reports compliance with her aspirin and brilinta. LHC 01/16/2019 showed 40% pLAD disease, 75% mLAD disease, and 55% mLCx disease, in addition to above interventions. Her post-cath course was c/b a retroperitoneal hemorrhage s/p evacuation of hematoma and surgical repair by Vascular surgery. Hypotension has limited addition metoprolol. She apparently turned down CABG after initial cath 12/2018, though states she may have not understood the risks/benefits of CABG vs PCI.  - C/f unstable angina - will plan for repeat LHC via right radial or left groin with Dr. Irish Lack today - Will update nephrology to plans for cath today.  - Continue aspirin and brilinta - Continue heparin gtt - Continue statin  2. HTN: BP intermittently soft. Has not tolerated metoprolol - Continue to monitor  3. HLD: LDL 116 01/16/2019 - Will update FLP now - Continue statin  4. Chronic diastolic CHF: she appears euvolemic on exam. Volume managed by PD - Continue volume management per nephrology  5. DM type 2: A1C 7.2 12/2018 - Continue insulin per primary team  6. ESRD on PD: Nephrology following - Continue PD per nephrology   For questions or updates, please contact [place] Please consult  [email or web address] for contact info under Cardiology/STEMI.      Signed, Abigail Butts, PA-C  03/03/2019, 8:04 AM   [phone]

## 2019-03-03 NOTE — Progress Notes (Signed)
Patient c/o CP 10/10.  EKG with no significant changes.  CP resolved with 1 SL NTG and O2 via [place] at 2L/min.  Will continue to monitor.  Jodell Cipro

## 2019-03-03 NOTE — H&P (View-Only) (Signed)
The patient has been seen in conjunction with Roby Lofts, PA-C. All aspects of care have been considered and discussed. The patient has been personally interviewed, examined, and all clinical data has been reviewed.   Spoke to the patient and to nephrology.  Given recurrent discomfort similar to pre-PCI (atypical features) repeat coronary angiography is recommended.  Still has a healing ulcer in the right femoral.  I discussed both with the patient and nephrology using the right radial approach although we tried a avoid using the arm and end-stage renal disease.  Dr. Melvia Heaps is aware and feels that radial approach is appropriate if that is what we recommend.  Also has the patient set to undergo peritoneal dialysis this evening.  She missed dialysis yesterday.  The patient was counseled to undergo left heart catheterization, coronary angiography, and possible percutaneous coronary intervention with stent implantation. The procedural risks and benefits were discussed in detail. The risks discussed included death, stroke, myocardial infarction, life-threatening bleeding, limb ischemia, kidney injury, allergy, and possible emergency cardiac surgery. The risk of these significant complications were estimated to occur less than 1% of the time. After discussion, the patient has agreed to proceed.    Progress Note  Patient Name: Brenda Davis Date of Encounter: 03/03/2019  Primary Cardiologist: Skeet Latch, MD   Subjective   Still with some chest pain this morning. Reports this feels similar to prior MI. Reports compliance with DAPT. Has not missed any PD sessions. She still has an open wound from [facility] 01/16/2019.   Inpatient Medications    Scheduled Meds: . aspirin EC  81 mg Oral Daily  . calcium acetate  667 mg Oral TID WC  . cinacalcet  90 mg Oral QHS  . ferric citrate  420 mg Oral TID WC  . folic acid  1 mg Oral Daily  . gabapentin  100 mg Oral BID  . insulin aspart   0-6 Units Subcutaneous TID WC  . rosuvastatin  10 mg Oral Daily  . ticagrelor  90 mg Oral BID  . vitamin B-12  1,000 mcg Oral Daily   Continuous Infusions: . sodium chloride    . heparin 1,000 Units/hr (03/02/19 2325)   PRN Meds: acetaminophen, gentamicin cream, nitroGLYCERIN, ondansetron (ZOFRAN) IV   Vital Signs    Vitals:   03/03/19 0230 03/03/19 0331 03/03/19 0334 03/03/19 0423  BP: 136/84  132/82 (!) 146/83  Pulse: 78  78   Resp: 12  20   Temp:   98 F (36.7 C)   TempSrc:   Oral   SpO2: 96%  100%   Weight:  124.7 kg    Height:  5' 5"  (1.651 m)     No intake or output data in the 24 hours ending 03/03/19 0804 Filed Weights   03/02/19 2230 03/03/19 0331  Weight: 125.6 kg 124.7 kg    Telemetry    NSR - Personally Reviewed  ECG    NSR, no STE/D, no TWI, rate 78 bpm, QTc 513 - Personally Reviewed  Physical Exam   GEN: Laying in bed appearing mildly uncomfortable. Seems somnolent.    Neck: No JVD, no carotid bruits Cardiac: RRR, no murmurs, rubs, or gallops. Right groin cath site still with open wound though no surrounding erythema, no pus drainage Respiratory: Clear to auscultation bilaterally, no wheezes/ rales/ rhonchi GI: NABS, Soft, nontender, non-distended  MS: No edema; No deformity. Neuro:  Nonfocal, moving all extremities spontaneously Psych: Normal affect   Labs    Chemistry Recent  Labs  Lab 03/02/19 1720  NA 139  K 3.3*  CL 97*  CO2 27  GLUCOSE 155*  BUN 46*  CREATININE 14.41*  CALCIUM 8.7*  GFRNONAA 3*  GFRAA 3*  ANIONGAP 15     Hematology Recent Labs  Lab 03/02/19 1720 03/03/19 0622  WBC 9.8 9.6  RBC 3.78* 3.45*  HGB 12.0 10.9*  HCT 38.0 34.0*  MCV 100.5* 98.6  MCH 31.7 31.6  MCHC 31.6 32.1  RDW 16.1* 15.9*  PLT 479* 412*    Cardiac EnzymesNo results for input(s): TROPONINI in the last 168 hours. No results for input(s): TROPIPOC in the last 168 hours.   BNPNo results for input(s): BNP, PROBNP in the last 168 hours.     DDimer No results for input(s): DDIMER in the last 168 hours.   Radiology    DG Chest 2 View  Result Date: 03/02/2019 CLINICAL DATA:  Chest pain EXAM: CHEST - 2 VIEW COMPARISON:  02/11/2019 FINDINGS: Heart and mediastinal contours are within normal limits. No focal opacities or effusions. No acute bony abnormality. IMPRESSION: No active cardiopulmonary disease. Electronically Signed   By: Rolm Baptise M.D.   On: 03/02/2019 17:40    Cardiac Studies   Left heart catheterization 01/16/2019:  Mid LAD lesion is 75% stenosed. The LAD is diffusely diseased and heavily calcified.  Mid Cx to Dist Cx lesion is 55% stenosed with 40% stenosed side branch in Ost 3rd Mrg.  Prox RCA lesion is 75% stenosed. This disease appears to have progressed.  Ost 1st Mrg-1 lesion is 50% stenosed.  Ost 1st Mrg-2 lesion is 99% stenosed. This lesion is significantly worse than prior.  The left ventricular systolic function is normal.  LV end diastolic pressure is normal.  The left ventricular ejection fraction is 55-65% by visual estimate.  There is no aortic valve stenosis.  Ost LAD to Mid LAD lesion is 40% stenosed.   Severe multivessel disease with progression of calcific disease in the mid LAD, proximal RCA and tortuous mid OM1.    Plan for surgical consult.    Coronary stent intervention 01/17/2019:  Mid LAD lesion is 75% stenosed. The LAD was not addressed and will be managed medically.  Ost LAD to Mid LAD lesion is 40% stenosed.  Mid Cx to Dist Cx lesion is 55% stenosed with 40% stenosed side branch in Ost 3rd Mrg.  Prox RCA lesion is 75% stenosed.  A drug-eluting stent was successfully placed using a SYNERGY XD 3.0X38, postdilated to 3.5 mm.  Post intervention, there is a 0% residual stenosis.  Ost 1st Mrg lesion is 99% stenosed. EBU4 Guide catheter used.  A drug-eluting stent was successfully placed using a SYNERGY XD 2.25X24, postdilated to 2.75 mm.  Post intervention, there  is a 0% residual stenosis.   Continue DAPT for 12 months.  Would recommend clopidogrel long-term after one year since she has such diffuse disease.  Continue aggressive secondary prevention.    Patient Profile     54 y.o. female with PMH of severe multivessel CAD s/p PCI/DES to RCA and OM1 54/56/2563, chronic diastolic CHF, HTN, HLD, DM type 2, who is being followed by cardiology for the evaluation of chest pain.   Assessment & Plan    1.Unstable angina in patient with CAD s/p recent PCI/DES to RCA and OM1 01/17/2019: patient presented with intermittent chest pain/pressure similar to prior MI. EKG was non-ischemic. Trop 893>734. She had recurrent chest pain this morning, though no dynamic changes on repeat EKG. She  reports compliance with her aspirin and brilinta. LHC 01/16/2019 showed 40% pLAD disease, 75% mLAD disease, and 55% mLCx disease, in addition to above interventions. Her post-cath course was c/b a retroperitoneal hemorrhage s/p evacuation of hematoma and surgical repair by Vascular surgery. Hypotension has limited addition metoprolol. She apparently turned down CABG after initial cath 12/2018, though states she may have not understood the risks/benefits of CABG vs PCI.  - C/f unstable angina - will plan for repeat LHC via right radial or left groin with Dr. Irish Lack today - Will update nephrology to plans for cath today.  - Continue aspirin and brilinta - Continue heparin gtt - Continue statin  2. HTN: BP intermittently soft. Has not tolerated metoprolol - Continue to monitor  3. HLD: LDL 116 01/16/2019 - Will update FLP now - Continue statin  4. Chronic diastolic CHF: she appears euvolemic on exam. Volume managed by PD - Continue volume management per nephrology  5. DM type 2: A1C 7.2 12/2018 - Continue insulin per primary team  6. ESRD on PD: Nephrology following - Continue PD per nephrology   For questions or updates, please contact [place] Please consult  [email or web address] for contact info under Cardiology/STEMI.      Signed, Abigail Butts, PA-C  03/03/2019, 8:04 AM   [phone]

## 2019-03-03 NOTE — ED Notes (Signed)
Pt was lowered to approx. 25 degrees to rest. Pt turned on side and had chest pain of 8. Repositioned pt. To her back and raised HOB. Pain quickly subsided and went away. Monitor showed PVC's but that leads were off. Leads checked and repositioned. Pt is fine and NSR, with no pain and vitals WDL.

## 2019-03-03 NOTE — Progress Notes (Signed)
[place] for Heparin Indication: chest pain/ACS  Allergies  Allergen Reactions  . Amlodipine Swelling  . Atorvastatin     Elevated LFT's  . Clonidine Derivatives Swelling    Limbs swell  . Doxycycline Nausea And Vomiting    "I threw up for 3 hours"  . Welchol [Colesevelam Hcl] Nausea Only    Patient Measurements: Height: 5' 5"  (165.1 cm) Weight: 274 lb 14.4 oz (124.7 kg) IBW/kg (Calculated) : 57 Heparin Dosing Weight: 87.6kg  Vital Signs: Temp: 98 F (36.7 C) (02/12 0334) Temp Source: Oral (02/12 0334) BP: 146/83 (02/12 0423) Pulse Rate: 78 (02/12 0334)  Labs: Recent Labs    03/02/19 1720 03/02/19 1920 03/03/19 0622  HGB 12.0  --  10.9*  HCT 38.0  --  34.0*  PLT 479*  --  412*  HEPARINUNFRC  --   --  0.55  CREATININE 14.41*  --  15.56*  TROPONINIHS 119* 132*  --     Estimated Creatinine Clearance: 5.5 mL/min (A) (by C-G formula based on SCr of 15.56 mg/dL (H)).   Medical History: Past Medical History:  Diagnosis Date  . Anemia   . CAD (coronary artery disease)    a. cath in 02/2018 showing moderate 3-vessel CAD with 45% mid-LADm 55% LCx and 65% RCA stenosis which was not significant by FFR  . ESRD on dialysis ([place])   . Gout   . HCAP (healthcare-associated pneumonia) 05/06/2016  . History of blood transfusion    "related to surgery"  . Hyperlipidemia   . Hypertension   . Morbid obesity ([place])   . Pain    LEFT SHOULDER PAIN - WAS SEEN AT AN URGENT CARE - GIVEN SLING FOR COMFORT AND TOLD ROM AS TOLERATED.  Marland Kitchen Palpitations 09/24/2016  . Peritonitis, dialysis-associated ([place])   . Type II diabetes mellitus ([place])    "gastric sleeve OR corrected this" (05/06/2016)     Assessment: Patient is a 67 yom that presents to the ED with chest pain. The patient has a hx of CAD, ESRD, HTN, DM, and anemia. Patients Trop has been trending up at this time. Pharmacy has been asked to dose heparin for ACS.  Goal of Therapy:  Heparin  level 0.3-0.7 units/ml Monitor platelets by anticoagulation protocol: Yes   Plan:  Continue IV heparin at current rate. Confirm heparin level at noon today. F/u plans for anticoagulation after cath today.  Marguerite Olea, [facility] Clinical Pharmacist Phone [phone]  03/03/2019 11:15 AM

## 2019-03-03 NOTE — Progress Notes (Addendum)
Progress Note    Brenda Davis  KZL:[id number] DOB: 1965/08/16  DOA: 03/02/2019 PCP: [name], MD    Brief Narrative:   Chief complaint: chest pain  Medical records reviewed and are as summarized below:  Brenda Davis is an 54 y.o. female  with the past medical history of end-stage renal disease on peritoneal dialysis, CAD status post multiple prior PCI, chronic diastolic heart failure, diabetes, hyperlipidemia presented to the emergency department February 11 with chief complaint of chest pain.  Work-up revealed elevated troponin concerning for NSTEMI.  Evaluated by cardiology in the emergency department who opined would likely need cardiac catheterization and consideration for CABG.  Assessment/Plan:   Principal Problem:   Coronary artery disease involving native coronary artery of native heart with unstable angina pectoris ([place]) Active Problems:   ESRD on peritoneal dialysis ([place])   DM (diabetes mellitus), type 2 with renal complications (HCC)   NSTEMI (non-ST elevated myocardial infarction) ([place])   Chest pain   Morbid obesity with BMI of 40.0-44.9, adult ([place])   Anemia of chronic disease   Chronic heart failure with preserved ejection fraction (HCC)   Elevated troponin  #1.  Chest pain/unstable angina in patient with CAD status post recent PCI/DES 01/17/2019.  Continues with chest pain this morning.  Received nitroglycerin 30 minutes prior to my arrival and she reports it "took the edge off".  They waited by cardiology who opined elevated troponins, no EKG changes.  They recommend cardiac catheterization today -Continue n.p.o. -Continue aspirin and Brilinta -Continue heparin drip -Continue statin -Appreciate cardiology assistance  #2.  Hypertension.  Blood pressure low end of normal at times.  Chart review indicates she has been unable to tolerate beta-blocker due to hypotension.   -Monitor closely  #3.  Chronic diastolic heart failure.   Appears compensated.  Volume is managed with peritoneal dialysis.  She did not have dialysis last night.   -Obtain daily weights -Monitor intake and output -Defer further management to cardiology  #4.  End-stage renal disease.  Patient on peritoneal dialysis. Creatinine 15.56.  Chart review indicates this is a little above her baseline.  Potassium level 3.2.  Nephrology aware -Management per nephrology  #5.  Diabetes II.  Hemoglobin A1c 7.2 last month.  Home medications include Metformin.  Serum glucose 155 on admission. -Hold Metformin -Sliding scale insulin sensitive level -Monitor  #6.  Morbid obesity.  BMI 45.7. -Weight loss encouraged   Family Communication/Anticipated D/C date and plan/Code Status   DVT prophylaxis: Lovenox ordered. Code Status: Full Code.  Family Communication: patient  Disposition Plan: home when ready   Medical Consultants:    [facility] nephrology  [facility] cardiology   Anti-Infectives:    None  Subjective:   Awake alert not particularly interested in participating in physical exam or interview.  She states NTG given 30 minutes prior for 10 out of 10 chest pain has "taken the edge off".  Denies any shortness of breath or nausea  Objective:    Vitals:   03/03/19 0230 03/03/19 0331 03/03/19 0334 03/03/19 0423  BP: 136/84  132/82 (!) 146/83  Pulse: 78  78   Resp: 12  20   Temp:   98 F (36.7 C)   TempSrc:   Oral   SpO2: 96%  100%   Weight:  124.7 kg    Height:  5' 5"  (1.651 m)     No intake or output data in the 24 hours ending 03/03/19 [place]  03/02/19 2230 03/03/19 0331  Weight: 125.6 kg 124.7 kg    Exam: General: Obese alert no acute distress CV regular rate and rhythm no murmur gallop or rub trace lower extremity edema Respiratory: No increased work of breathing breath sounds are distant but clear bilaterally I hear no wheezes no rhonchi Abdomen obese soft positive bowel sounds throughout nontender to palpation no  guarding or rebounding Musculoskeletal: Joints without swelling/erythema full range of motion moves extremities spontaneously  Data Reviewed:   I have personally reviewed following labs and imaging studies:  Labs: Labs show the following:   Basic Metabolic Panel: Recent Labs  Lab 03/02/19 1720 03/03/19 0057 03/03/19 0622  NA 139  --  141  K 3.3*  --  3.2*  CL 97*  --  99  CO2 27  --  26  GLUCOSE 155*  --  98  BUN 46*  --  49*  CREATININE 14.41*  --  15.56*  CALCIUM 8.7*  --  8.2*  MG  --  1.8  --   PHOS  --  6.2*  --    GFR Estimated Creatinine Clearance: 5.5 mL/min (A) (by C-G formula based on SCr of 15.56 mg/dL (H)). Liver Function Tests: No results for input(s): AST, ALT, ALKPHOS, BILITOT, PROT, ALBUMIN in the last 168 hours. No results for input(s): LIPASE, AMYLASE in the last 168 hours. No results for input(s): AMMONIA in the last 168 hours. Coagulation profile No results for input(s): INR, PROTIME in the last 168 hours.  CBC: Recent Labs  Lab 03/02/19 1720 03/03/19 0622  WBC 9.8 9.6  NEUTROABS 7.0  --   HGB 12.0 10.9*  HCT 38.0 34.0*  MCV 100.5* 98.6  PLT 479* 412*   Cardiac Enzymes: No results for input(s): CKTOTAL, CKMB, CKMBINDEX, TROPONINI in the last 168 hours. BNP (last 3 results) No results for input(s): PROBNP in the last 8760 hours. CBG: Recent Labs  Lab 03/03/19 0837  GLUCAP 98   D-Dimer: No results for input(s): DDIMER in the last 72 hours. Hgb A1c: No results for input(s): HGBA1C in the last 72 hours. Lipid Profile: No results for input(s): CHOL, HDL, LDLCALC, TRIG, CHOLHDL, LDLDIRECT in the last 72 hours. Thyroid function studies: No results for input(s): TSH, T4TOTAL, T3FREE, THYROIDAB in the last 72 hours.  Invalid input(s): FREET3 Anemia work up: No results for input(s): VITAMINB12, FOLATE, FERRITIN, TIBC, IRON, RETICCTPCT in the last 72 hours. Sepsis Labs: Recent Labs  Lab 03/02/19 1720 03/03/19 0622  WBC 9.8 9.6     Microbiology Recent Results (from the past 240 hour(s))  SARS CORONAVIRUS 2 (TAT 6-24 HRS) Nasopharyngeal Nasopharyngeal Swab     Status: None   Collection Time: 03/02/19 11:47 PM   Specimen: Nasopharyngeal Swab  Result Value Ref Range Status   SARS Coronavirus 2 NEGATIVE NEGATIVE Final    Comment: (NOTE) SARS-CoV-2 target nucleic acids are NOT DETECTED. The SARS-CoV-2 RNA is generally detectable in upper and lower respiratory specimens during the acute phase of infection. Negative results do not preclude SARS-CoV-2 infection, do not rule out co-infections with other pathogens, and should not be used as the sole basis for treatment or other patient management decisions. Negative results must be combined with clinical observations, patient history, and epidemiological information. The expected result is Negative. Fact Sheet for Patients: SugarRoll.be Fact Sheet for Healthcare Providers: https://www.woods-mathews.com/ This test is not yet approved or cleared by the [place] FDA and  has been authorized for detection and/or diagnosis of SARS-CoV-2 by FDA  under an Emergency Use Authorization (EUA). This EUA will remain  in effect (meaning this test can be used) for the duration of the COVID-19 declaration under Section 56 4(b)(1) of the Act, 21 U.S.C. section 360bbb-3(b)(1), unless the authorization is terminated or revoked sooner. Performed at [facility], [place] [address], [place], [place] [address]     Procedures and diagnostic studies:  DG Chest 2 View  Result Date: 03/02/2019 CLINICAL DATA:  Chest pain EXAM: CHEST - 2 VIEW COMPARISON:  02/11/2019 FINDINGS: Heart and mediastinal contours are within normal limits. No focal opacities or effusions. No acute bony abnormality. IMPRESSION: No active cardiopulmonary disease. Electronically Signed   By: Rolm Baptise M.D.   On: 03/02/2019 17:40    Medications:   . aspirin EC  81  mg Oral Daily  . calcium acetate  667 mg Oral TID WC  . cinacalcet  90 mg Oral QHS  . ferric citrate  420 mg Oral TID WC  . folic acid  1 mg Oral Daily  . gabapentin  100 mg Oral BID  . insulin aspart  0-6 Units Subcutaneous TID WC  . rosuvastatin  10 mg Oral Daily  . ticagrelor  90 mg Oral BID  . vitamin B-12  1,000 mcg Oral Daily   Continuous Infusions: . sodium chloride    . heparin 1,000 Units/hr (03/02/19 2325)     LOS: 0 days   Radene Gunning NP  [facility]   How to contact the [facility] Attending or Consulting provider [place] or covering provider during after hours [place], for this patient?  1. Check the care team in [facility] and look for a) attending/consulting [facility] provider listed and b) the [facility] team listed 2. Log into [email or web address] and use Mobile's universal password to access. If you do not have the password, please contact the hospital operator. 3. Locate the [facility] provider you are looking for under [facility] and page to a number that you can be directly reached. 4. If you still have difficulty reaching the provider, please page the [facility] (Director on Call) for the Hospitalists listed on amion for assistance.  03/03/2019, 10:16 AM

## 2019-03-03 NOTE — Progress Notes (Signed)
   Reviewed cath results  With Dr. Irish Lack. The recently stented OM is subtotally occluded beyond the stent with thrombus and there is high grade disease proximal to the stent (not previously noted). Last procedure was complicated by inability to advance stent distally, Feeling is that current situation is not treatable with PCI. RCA stent is okay. There is moderately severe diffuse LAD disease. Plan medical therapy for now.  This is a poor prognostic indicator for event free CV survival. Our medical options are limited due to BP. Will add LA nitrate therapy (Imdur 30 mg) and follow clinically.

## 2019-03-03 NOTE — Interval H&P Note (Signed)
Cath Lab Visit (complete for each Cath Lab visit)  Clinical Evaluation Leading to the Procedure:   ACS: Yes.    Non-ACS:    Anginal Classification: CCS IV  Anti-ischemic medical therapy: Minimal Therapy (1 class of medications)  Non-Invasive Test Results: No non-invasive testing performed  Prior CABG: No previous CABG      History and Physical Interval Note:  03/03/2019 12:16 PM  Brenda Davis  has presented today for surgery, with the diagnosis of Unstable angina.  The various methods of treatment have been discussed with the patient and family. After consideration of risks, benefits and other options for treatment, the patient has consented to  Procedure(s): LEFT HEART CATH AND CORONARY ANGIOGRAPHY (N/A) as a surgical intervention.  The patient's history has been reviewed, patient examined, no change in status, stable for surgery.  I have reviewed the patient's chart and labs.  Questions were answered to the patient's satisfaction.     Larae Grooms

## 2019-03-03 NOTE — Progress Notes (Signed)
[place] for Heparin Indication: chest pain/ACS  Allergies  Allergen Reactions  . Amlodipine Swelling  . Atorvastatin     Elevated LFT's  . Clonidine Derivatives Swelling    Limbs swell  . Doxycycline Nausea And Vomiting    "I threw up for 3 hours"  . Welchol [Colesevelam Hcl] Nausea Only    Patient Measurements: Height: 5' 5"  (165.1 cm) Weight: 274 lb 14.4 oz (124.7 kg) IBW/kg (Calculated) : 57 Heparin Dosing Weight: 87.6kg  Vital Signs: Temp: 98 F (36.7 C) (02/12 0334) Temp Source: Oral (02/12 0334) BP: 113/63 (02/12 1327) Pulse Rate: 80 (02/12 1327)  Labs: Recent Labs    03/02/19 1720 03/02/19 1920 03/03/19 0622 03/03/19 1146  HGB 12.0  --  10.9*  --   HCT 38.0  --  34.0*  --   PLT 479*  --  412*  --   HEPARINUNFRC  --   --  0.55 0.50  CREATININE 14.41*  --  15.56*  --   TROPONINIHS 119* 132*  --   --     Estimated Creatinine Clearance: 5.5 mL/min (A) (by C-G formula based on SCr of 15.56 mg/dL (H)).   Medical History: Past Medical History:  Diagnosis Date  . Anemia   . CAD (coronary artery disease)    a. cath in 02/2018 showing moderate 3-vessel CAD with 45% mid-LADm 55% LCx and 65% RCA stenosis which was not significant by FFR  . ESRD on dialysis ([place])   . Gout   . HCAP (healthcare-associated pneumonia) 05/06/2016  . History of blood transfusion    "related to surgery"  . Hyperlipidemia   . Hypertension   . Morbid obesity ([place])   . Pain    LEFT SHOULDER PAIN - WAS SEEN AT AN URGENT CARE - GIVEN SLING FOR COMFORT AND TOLD ROM AS TOLERATED.  Marland Kitchen Palpitations 09/24/2016  . Peritonitis, dialysis-associated ([place])   . Type II diabetes mellitus ([place])    "gastric sleeve OR corrected this" (05/06/2016)     Assessment: Patient is a 56 yom that presents to the ED with chest pain. The patient has a hx of CAD, ESRD, HTN, DM, and anemia. Patients Trop has been trending up at this time. Pharmacy has been asked to dose  heparin for ACS.  Heparin turned off at time of cath, pharmacy asked to resume 8 hrs after sheath pull.  Sheath out at 1330 PM.  Goal of Therapy:  Heparin level 0.3-0.7 units/ml Monitor platelets by anticoagulation protocol: Yes   Plan:  Restart IV heparin at 2130 pm at previous rate of 1000 units/hr. Heparin level 8 hrs after gtt restarts. Daily heparin level and CBC. F/u further cards plans.  Marguerite Olea, [facility] Clinical Pharmacist Phone [phone]  03/03/2019 1:45 PM

## 2019-03-03 NOTE — Consult Note (Signed)
[name] Renal Consultation Note    Indication for Consultation:  Management of ESRD/hemodialysis; anemia, hypertension/volume and secondary hyperparathyroidism  HPI: Brenda Davis is a 54 y.o. female wtih ESRD on CCPD, CAD, HTN, hyperlipidemia T2DM. Hx NSTEMI in 05/2018. She had LHC in 06/2018 with multivessal CAD, non-obs. [facility] admission last month showed progression of disease s/p PCI/stent x 3  also complicated by development of retroperitoneal hematoma s/p drainage.   Now admitted with unstable angina. Underwent cardiac catheretization this am per cardiology showing occlusion of recently stented OM as well as high grade disease to proximal to stent. Continues medical therapy for now. We are asked to see for CCPD.   Seen and examined at bedside. Feels tired, CP has subsided. Denies f,c,sob,n,v,d. Complains of pain in abdomen when she lays on on right side.   Past Medical History:  Diagnosis Date  . Anemia   . CAD (coronary artery disease)    a. cath in 02/2018 showing moderate 3-vessel CAD with 45% mid-LADm 55% LCx and 65% RCA stenosis which was not significant by FFR  . ESRD on dialysis ([place])   . Gout   . HCAP (healthcare-associated pneumonia) 05/06/2016  . History of blood transfusion    "related to surgery"  . Hyperlipidemia   . Hypertension   . Morbid obesity ([name])   . Pain    LEFT SHOULDER PAIN - WAS SEEN AT AN URGENT CARE - GIVEN SLING FOR COMFORT AND TOLD ROM AS TOLERATED.  Marland Kitchen Palpitations 09/24/2016  . Peritonitis, dialysis-associated ([place])   . Type II diabetes mellitus ([name])    "gastric sleeve OR corrected this" (05/06/2016)   Past Surgical History:  Procedure Laterality Date  . AV FISTULA PLACEMENT Left 01/03/2015   Procedure: BRACHIAL CEPHALIC ARTERIOVENOUS  FISTULA CREATION LEFT ARM;  Surgeon: Angelia Mould, MD;  Location: [place];  Service: Vascular;  Laterality: Left;  . CARPAL TUNNEL RELEASE Right   . [place]  .  CHOLECYSTECTOMY  09/14/2017  . CORONARY STENT INTERVENTION N/A 01/17/2019   Procedure: CORONARY STENT INTERVENTION;  Surgeon: Jettie Booze, MD;  Location: [facility];  Service: Cardiovascular;  Laterality: N/A;  . ESOPHAGOGASTRODUODENOSCOPY N/A 09/04/2012   Procedure: ESOPHAGOGASTRODUODENOSCOPY (EGD);  Surgeon: Shann Medal, MD;  Location: [facility];  Service: General;  Laterality: N/A;  PF  . ESOPHAGOGASTRODUODENOSCOPY (EGD) WITH ESOPHAGEAL DILATION N/A 09/29/2012   Procedure: ESOPHAGOGASTRODUODENOSCOPY (EGD) WITH ESOPHAGEAL DILATION;  Surgeon: Milus Banister, MD;  Location: [facility];  Service: Endoscopy;  Laterality: N/A;  . FEMORAL ARTERY EXPLORATION Right 01/17/2019   Procedure: Exploration Right Groin with Primary Closure or Arteriotomy Site; Washout of Retroperitoneal Hematoma;  Surgeon: Waynetta Sandy, MD;  Location: [place];  Service: Vascular;  Laterality: Right;  . INSERTION OF DIALYSIS CATHETER N/A 01/03/2015   Procedure: INSERTION OF DIALYSIS CATHETER RIGHT INTERNAL JUGULAR;  Surgeon: Angelia Mould, MD;  Location: [place];  Service: Vascular;  Laterality: N/A;  . LAPAROSCOPIC GASTRIC SLEEVE RESECTION N/A 07/19/2012   Procedure: LAPAROSCOPIC SLEEVE GASTRECTOMY with EGD;  Surgeon: Madilyn Hook, DO;  [facility]: [facility];  Service: General;  Laterality: N/A;  laparoscopic sleeve gastrectomy with EGD  . LEFT HEART CATH AND CORONARY ANGIOGRAPHY N/A 02/25/2018   Procedure: LEFT HEART CATH AND CORONARY ANGIOGRAPHY;  Surgeon: Leonie Man, MD;  Location: [place] [facility];  Service: Cardiovascular;  Laterality: N/A;  . LEFT HEART CATH AND CORONARY ANGIOGRAPHY N/A 06/20/2018   Procedure: LEFT HEART CATH AND  CORONARY ANGIOGRAPHY;  Surgeon: Nelva Bush, MD;  Location: [facility];  Service: Cardiovascular;  Laterality: N/A;  . LEFT HEART CATH AND CORONARY ANGIOGRAPHY N/A 01/16/2019   Procedure: LEFT HEART CATH AND CORONARY ANGIOGRAPHY;  Surgeon:  Jettie Booze, MD;  Location: [facility];  Service: Cardiovascular;  Laterality: N/A;  . PERIPHERAL VASCULAR CATHETERIZATION Left 05/23/2015   Procedure: Nolon Stalls;  Surgeon: Conrad Sherburne, MD;  Location: [facility];  Service: Cardiovascular;  Laterality: Left;  upper aRM  . PERIPHERAL VASCULAR CATHETERIZATION Left 05/23/2015   Procedure: Peripheral Vascular Balloon Angioplasty;  Surgeon: Conrad , MD;  Location: [facility];  Service: Cardiovascular;  Laterality: Left;  av fistula  . TUBAL LIGATION  1993  . UPPER GI ENDOSCOPY N/A 07/19/2012   Procedure: UPPER GI ENDOSCOPY;  Surgeon: Madilyn Hook, DO;  [facility];  Service: General;  Laterality: N/A;   Family History  Problem Relation Age of Onset  . Hypertension Mother   . Mitral valve prolapse Mother   . Diabetes Father   . Arthritis Other   . Hyperlipidemia Other   . Cancer Neg Hx   . Heart disease Neg Hx   . Kidney disease Neg Hx   . Stroke Neg Hx    Social History:  reports that she quit smoking about 12 years ago. Her smoking use included cigarettes. She has a 16.00 pack-year smoking history. She has never used smokeless tobacco. She reports that she does not drink alcohol or use drugs. Allergies  Allergen Reactions  . Amlodipine Swelling  . Atorvastatin     Elevated LFT's  . Clonidine Derivatives Swelling    Limbs swell  . Doxycycline Nausea And Vomiting    "I threw up for 3 hours"  . Welchol [Colesevelam Hcl] Nausea Only   Prior to Admission medications   Medication Sig Start Date End Date Taking? Authorizing Provider  aspirin EC 81 MG EC tablet Take 1 tablet (81 mg total) by mouth daily. 02/26/18  Yes Smith, Rondell A, MD  calcium acetate (PHOSLO) 667 MG capsule Take 667 mg by mouth 3 (three) times daily with meals. 06/02/18  Yes [provider]  cinacalcet (SENSIPAR) 90 MG tablet Take 90 mg by mouth at bedtime. 12/08/18  Yes [provider]  Cyanocobalamin (B-12 PO)  Take 1 tablet by mouth daily.   Yes [provider]  ferric citrate (AURYXIA) 1 GM 210 MG(Fe) tablet Take 2 tablets (420 mg total) by mouth 3 (three) times daily with meals. 01/23/19  Yes Daune Perch, NP  folic acid (FOLVITE) 1 MG tablet Take 1 tablet (1 mg total) by mouth daily. 03/08/18  Yes [name], MD  gabapentin (NEURONTIN) 100 MG capsule Take 100 mg by mouth 2 (two) times daily. 10/31/18  Yes [provider]  gentamicin cream (GARAMYCIN) 0.1 % Apply 1 application topically See admin instructions. Apply to exit site daily as directed. 04/26/17  Yes [provider]  nitroGLYCERIN (NITROSTAT) 0.4 MG SL tablet PLACE 1 TABLET (0.4 MG TOTAL) UNDER THE TONGUE EVERY 5 (FIVE) MINUTES AS NEEDED FOR CHEST PAIN. 08/01/18  Yes Skeet Latch, MD  ondansetron (ZOFRAN ODT) 8 MG disintegrating tablet Take 1 tablet (8 mg total) by mouth every 8 (eight) hours as needed for nausea or vomiting. 04/09/18  Yes Jola Schmidt, MD  rosuvastatin (CRESTOR) 10 MG tablet Take 1 tablet (10 mg total) by mouth daily. 02/01/19 05/02/19 Yes Cleaver, Jossie Ng, NP  ticagrelor Kary Kos)  90 MG TABS tablet Take 1 tablet (90 mg total) by mouth 2 (two) times daily. 02/01/19 01/27/20 Yes Cleaver, Jossie Ng, NP  metFORMIN (GLUCOPHAGE) 500 MG tablet Take 1 tablet (500 mg total) by mouth 2 (two) times daily with a meal. Patient not taking: Reported on 03/02/2019 01/23/19 01/23/20  Daune Perch, NP   Current Facility-Administered Medications  Medication Dose Route Frequency Provider Last Rate Last Admin  . 0.9 %  sodium chloride infusion   Intravenous Continuous Jettie Booze, MD 10 mL/hr at 03/03/19 0800 New Bag at 03/03/19 0800  . acetaminophen (TYLENOL) tablet 650 mg  650 mg Oral Q6H PRN Jettie Booze, MD   650 mg at 03/03/19 0720  . aspirin EC tablet 81 mg  81 mg Oral Daily Jettie Booze, MD   81 mg at 03/03/19 1058  . calcium acetate (PHOSLO) capsule 667 mg  667 mg Oral TID WC Jettie Booze, MD      . cinacalcet [facility]) tablet 90 mg  90 mg Oral QHS Jettie Booze, MD   90 mg at 03/03/19 3419  . ferric citrate (AURYXIA) tablet 420 mg  420 mg Oral TID WC Jettie Booze, MD      . folic acid (FOLVITE) tablet 1 mg  1 mg Oral Daily Jettie Booze, MD      . gabapentin (NEURONTIN) capsule 100 mg  100 mg Oral BID Jettie Booze, MD   100 mg at 03/03/19 0059  . gentamicin cream (GARAMYCIN) 0.1 % 1 application  1 application Topical Daily PRN Jettie Booze, MD      . gentamicin cream (GARAMYCIN) 0.1 % 1 application  1 application Topical Daily Larae Grooms S, MD      . heparin 1000 unit/ml injection 500 Units  500 Units Intraperitoneal PRN Larae Grooms S, MD      . heparin ADULT infusion 100 units/mL (25000 units/220m sodium chloride 0.45%)  1,000 Units/hr Intravenous Continuous Carney, JGay Filler RPH      . insulin aspart (novoLOG) injection 0-6 Units  0-6 Units Subcutaneous TID WC VJettie Booze MD      . ondansetron ([facility] injection 4 mg  4 mg Intravenous Q6H PRN VJettie Booze MD      . rosuvastatin (CRESTOR) tablet 10 mg  10 mg Oral Daily VJettie Booze MD   10 mg at 03/03/19 1058  . ticagrelor (BRILINTA) tablet 90 mg  90 mg Oral BID VJettie Booze MD   90 mg at 03/03/19 1058  . vitamin B-12 (CYANOCOBALAMIN) tablet 1,000 mcg  1,000 mcg Oral Daily VJettie Booze MD         ROS: As per HPI otherwise negative.  Physical Exam: Vitals:   03/03/19 1312 03/03/19 1317 03/03/19 1322 03/03/19 1327  BP: 106/62 117/64 117/63 113/63  Pulse: 82 85 71 80  Resp: 17 13 (!) 21 12  Temp:      TempSrc:      SpO2: 99% 99% 99% 100%  Weight:      Height:         General: Obese female laying on side, nad  Head: NCAT sclera not icteric MMM Neck: Supple. No JVD appreciated  Lungs: CTA bilaterally without wheezes, rales, or rhonchi. Breathing is unlabored. Heart: RRR with S1 S2 Abdomen: soft non-tender  PD cath in place - no tenderness around cathter  Lower extremities:without edema or ischemic changes, no open wounds  Neuro: A & O  X  3. Moves all extremities spontaneously. Psych:  Responds to questions appropriately with a normal affect. Dialysis Access: PD cath + LUE AVF +bruit   Labs: Basic Metabolic Panel: Recent Labs  Lab 03/02/19 1720 03/03/19 0057 03/03/19 0622  NA 139  --  141  K 3.3*  --  3.2*  CL 97*  --  99  CO2 27  --  26  GLUCOSE 155*  --  98  BUN 46*  --  49*  CREATININE 14.41*  --  15.56*  CALCIUM 8.7*  --  8.2*  PHOS  --  6.2*  --    Liver Function Tests: No results for input(s): AST, ALT, ALKPHOS, BILITOT, PROT, ALBUMIN in the last 168 hours. No results for input(s): LIPASE, AMYLASE in the last 168 hours. No results for input(s): AMMONIA in the last 168 hours. CBC: Recent Labs  Lab 03/02/19 1720 03/03/19 0622  WBC 9.8 9.6  NEUTROABS 7.0  --   HGB 12.0 10.9*  HCT 38.0 34.0*  MCV 100.5* 98.6  PLT 479* 412*   Cardiac Enzymes: No results for input(s): CKTOTAL, CKMB, CKMBINDEX, TROPONINI in the last 168 hours. CBG: Recent Labs  Lab 03/03/19 0837  GLUCAP 98   Iron Studies: No results for input(s): IRON, TIBC, TRANSFERRIN, FERRITIN in the last 72 hours. Studies/Results: DG Chest 2 View  Result Date: 03/02/2019 CLINICAL DATA:  Chest pain EXAM: CHEST - 2 VIEW COMPARISON:  02/11/2019 FINDINGS: Heart and mediastinal contours are within normal limits. No focal opacities or effusions. No acute bony abnormality. IMPRESSION: No active cardiopulmonary disease. Electronically Signed   By: Rolm Baptise M.D.   On: 03/02/2019 17:40   CARDIAC CATHETERIZATION  Result Date: 03/03/2019  Mid LAD lesion is 75% stenosed.  Ost LAD to Mid LAD lesion is 40% stenosed.  Previously placed Ost 1st Mrg drug eluting stent is widely patent.  Mid Cx to Dist Cx lesion is 55% stenosed with 40% stenosed side branch in Ost 3rd Mrg.  Previously placed Prox RCA drug eluting stent is  widely patent.  Ost 1st Mrg to 1st Mrg lesion is 99% stenosed. Partially in the stent and partially distal to the stent- subacte stent thrombosis.  LV end diastolic pressure is normal.  There is no aortic valve stenosis.  Medical therapy for stent thrombosis of the OM stent.  Given the severe tortuosity, I don't think repeat PCI attempt would be beneficial.  There was significant difficulty getting a stent delivered during the last cath.  I think there would be a high likelihood of completely occluding the vessel with wire manipulation. .    Dialysis Orders:  CCPD 6 exchanges 2.7L fill. 1.5h dwell No day dwell. (Patient says she has been doing 5  exchanges)   Assessment/Plan: 1.  NSTEMI/CAD. Known multivessel disease. LHC with progressive disease, occlusion of recent OM stent. Cardiology following - medical therapy for now.  2.  ESRD -  Cr 15.5. Continue CCPD. Missed treatment last night d/t hosp admission  3.  Hypertension/volume  - BP/volume stable. Use 1.5%/2.5% combination on PD.  4.  Anemia  - Hgb 10.9 No ESA needs. Follow trends.  5.  Metabolic bone disease -  Ca ok. Continue home binder.  6.  Nutrition - Renal diet/vitamins   Lynnda Child PA-C [facility] Pager [phone] 03/03/2019, 2:25 PM

## 2019-03-04 LAB — BASIC METABOLIC PANEL
Anion gap: 18 — ABNORMAL HIGH (ref 5–15)
BUN: 49 mg/dL — ABNORMAL HIGH (ref 6–20)
CO2: 23 mmol/L (ref 22–32)
Calcium: 7.8 mg/dL — ABNORMAL LOW (ref 8.9–10.3)
Chloride: 97 mmol/L — ABNORMAL LOW (ref 98–111)
Creatinine, Ser: 15.43 mg/dL — ABNORMAL HIGH (ref 0.44–1.00)
GFR calc Af Amer: 3 mL/min — ABNORMAL LOW (ref >60)
GFR calc non Af Amer: 2 mL/min — ABNORMAL LOW (ref >60)
Glucose, Bld: 108 mg/dL — ABNORMAL HIGH (ref 70–99)
Potassium: 3.7 mmol/L (ref 3.5–5.1)
Sodium: 138 mmol/L (ref 135–145)

## 2019-03-04 LAB — CBC
HCT: 35.8 % — ABNORMAL LOW (ref 36.0–46.0)
Hemoglobin: 11.3 g/dL — ABNORMAL LOW (ref 12.0–15.0)
MCH: 31.2 pg (ref 26.0–34.0)
MCHC: 31.6 g/dL (ref 30.0–36.0)
MCV: 98.9 fL (ref 80.0–100.0)
Platelets: 476 10*3/uL — ABNORMAL HIGH (ref 150–400)
RBC: 3.62 MIL/uL — ABNORMAL LOW (ref 3.87–5.11)
RDW: 16 % — ABNORMAL HIGH (ref 11.5–15.5)
WBC: 9.5 10*3/uL (ref 4.0–10.5)
nRBC: 0.5 % — ABNORMAL HIGH (ref 0.0–0.2)

## 2019-03-04 LAB — HEPARIN LEVEL (UNFRACTIONATED): Heparin Unfractionated: 0.37 IU/mL (ref 0.30–0.70)

## 2019-03-04 LAB — GLUCOSE, CAPILLARY
Glucose-Capillary: 97 mg/dL (ref 70–99)
Glucose-Capillary: 114 mg/dL — ABNORMAL HIGH (ref 70–99)
Glucose-Capillary: 100 mg/dL — ABNORMAL HIGH (ref 70–99)
Glucose-Capillary: 125 mg/dL — ABNORMAL HIGH (ref 70–99)

## 2019-03-04 MED ORDER — ALUM & MAG HYDROXIDE-SIMETH 200-200-20 MG/5ML PO SUSP
30.0000 mL | Freq: Once | ORAL | Status: AC
  Administered 2019-03-04: 18:00:00 30 mL via ORAL
  Filled 2019-03-04: qty 30

## 2019-03-04 MED ORDER — ISOSORBIDE DINITRATE 10 MG PO TABS
10.0000 mg | ORAL_TABLET | Freq: Two times a day (BID) | ORAL | Status: DC
  Administered 2019-03-05 – 2019-03-08 (×6): 10 mg via ORAL
  Filled 2019-03-04 (×7): qty 1

## 2019-03-04 MED ORDER — ALUM & MAG HYDROXIDE-SIMETH 200-200-20 MG/5ML PO SUSP
30.0000 mL | Freq: Once | ORAL | Status: AC | PRN
  Administered 2019-03-04: 30 mL via ORAL
  Filled 2019-03-04: qty 30

## 2019-03-04 MED ORDER — PANTOPRAZOLE SODIUM 40 MG PO TBEC
40.0000 mg | DELAYED_RELEASE_TABLET | Freq: Every day | ORAL | Status: DC
  Administered 2019-03-04: 10:00:00 40 mg via ORAL
  Filled 2019-03-04: qty 1

## 2019-03-04 MED ORDER — PANTOPRAZOLE SODIUM 40 MG PO TBEC
40.0000 mg | DELAYED_RELEASE_TABLET | Freq: Two times a day (BID) | ORAL | Status: DC
  Administered 2019-03-04 – 2019-03-14 (×19): 40 mg via ORAL
  Filled 2019-03-04 (×19): qty 1

## 2019-03-04 MED ORDER — NITROGLYCERIN 0.4 MG SL SUBL
0.4000 mg | SUBLINGUAL_TABLET | SUBLINGUAL | Status: DC | PRN
  Administered 2019-03-04 – 2019-03-09 (×10): 0.4 mg via SUBLINGUAL
  Filled 2019-03-04 (×8): qty 1

## 2019-03-04 MED ORDER — NITROGLYCERIN 0.4 MG SL SUBL
SUBLINGUAL_TABLET | SUBLINGUAL | Status: AC
  Filled 2019-03-04: qty 1

## 2019-03-04 MED ORDER — ISOSORBIDE MONONITRATE ER 30 MG PO TB24
15.0000 mg | ORAL_TABLET | Freq: Every day | ORAL | Status: DC
  Administered 2019-03-04: 10:00:00 15 mg via ORAL
  Filled 2019-03-04: qty 1

## 2019-03-04 MED ORDER — LIDOCAINE VISCOUS HCL 2 % MT SOLN: 15.0000 mL | Freq: Once | OROMUCOSAL | Status: DC | PRN

## 2019-03-04 MED ORDER — HYDROCORTISONE 1 % EX OINT
TOPICAL_OINTMENT | Freq: Four times a day (QID) | CUTANEOUS | Status: DC
  Filled 2019-03-04 (×2): qty 28

## 2019-03-04 MED ORDER — ISOSORBIDE DINITRATE 10 MG PO TABS: 10.0000 mg | ORAL_TABLET | Freq: Two times a day (BID) | ORAL | Status: DC

## 2019-03-04 MED ORDER — DICLOFENAC SODIUM 1 % EX GEL
2.0000 g | Freq: Four times a day (QID) | CUTANEOUS | Status: DC | PRN
  Administered 2019-03-12: 2 g via TOPICAL
  Filled 2019-03-04: qty 100

## 2019-03-04 MED ORDER — MORPHINE SULFATE (PF) 2 MG/ML IV SOLN
2.0000 mg | Freq: Once | INTRAVENOUS | Status: AC
  Administered 2019-03-04: 19:00:00 2 mg via INTRAVENOUS
  Filled 2019-03-04: qty 1

## 2019-03-04 MED ORDER — LIDOCAINE VISCOUS HCL 2 % MT SOLN
15.0000 mL | Freq: Once | OROMUCOSAL | Status: AC
  Administered 2019-03-04: 18:00:00 15 mL via ORAL
  Filled 2019-03-04: qty 15

## 2019-03-04 NOTE — Progress Notes (Signed)
Pt is complaining of chest pain 8/10, BP 110/69 P 93, o2 sat 95 %. Received SL nitroglycerine 0.4 mg per MD order.  Ferdinand Lango, RN

## 2019-03-04 NOTE — Plan of Care (Signed)
  Problem: Pain Managment: Goal: General experience of comfort will improve Outcome: Progressing   

## 2019-03-04 NOTE — Progress Notes (Signed)
[facility] Progress Note   Subjective:  Seen in room. Feels well this am. Denies CP, SOB. No issues with PD overnight   Objective Vitals:   03/03/19 1327 03/03/19 1954 03/04/19 0020 03/04/19 0403  BP: 113/63 131/80 110/69 107/74  Pulse: 80 87 91 81  Resp: 12 17 13 14   Temp:  98.3 F (36.8 C)  98.1 F (36.7 C)  TempSrc:  Oral  Oral  SpO2: 100% 100% 96% 97%  Weight:      Height:        Weight change:   124.7 kg  Additional Objective Labs: Basic Metabolic Panel: Recent Labs  Lab 03/02/19 1720 03/03/19 0057 03/03/19 0622 03/04/19 0826  NA 139  --  141 138  K 3.3*  --  3.2* 3.7  CL 97*  --  99 97*  CO2 27  --  26 23  GLUCOSE 155*  --  98 108*  BUN 46*  --  49* 49*  CREATININE 14.41*  --  15.56* 15.43*  CALCIUM 8.7*  --  8.2* 7.8*  PHOS  --  6.2*  --   --    CBC: Recent Labs  Lab 03/02/19 1720 03/03/19 0622 03/04/19 0826  WBC 9.8 9.6 9.5  NEUTROABS 7.0  --   --   HGB 12.0 10.9* 11.3*  HCT 38.0 34.0* 35.8*  MCV 100.5* 98.6 98.9  PLT 479* 412* 476*   Blood Culture    Component Value Date/Time   SDES BLOOD RIGHT HAND 01/18/2019 0628   SPECREQUEST  01/18/2019 9604    BOTTLES DRAWN AEROBIC ONLY Blood Culture results may not be optimal due to an inadequate volume of blood received in culture bottles   CULT  01/18/2019 0628    NO GROWTH 5 DAYS Performed at [facility], [place] [address] [place], [place] [address]    REPTSTATUS 01/23/2019 FINAL 01/18/2019 1191     Physical Exam General: alert, sitting up in bed, nad  Heart: RRR no m,r,g Lungs: Clear bilaterally  Abdomen: soft, non-tender; PD cath in place  Extremities: no LE edema  Dialysis Access: PD cath + LUE AVF +bruit   Medications: . sodium chloride 10 mL/hr at 03/03/19 0800  . heparin 1,000 Units/hr (03/03/19 2212)   . aspirin EC  81 mg Oral Daily  . calcium acetate  667 mg Oral TID WC  . cinacalcet  90 mg Oral QHS  . ferric citrate  420 mg Oral TID WC  . folic acid   1 mg Oral Daily  . gabapentin  100 mg Oral BID  . gentamicin cream  1 application Topical Daily  . insulin aspart  0-6 Units Subcutaneous TID WC  . isosorbide mononitrate  15 mg Oral Daily  . nitroGLYCERIN      . pantoprazole  40 mg Oral Daily  . rosuvastatin  10 mg Oral Daily  . ticagrelor  90 mg Oral BID  . vitamin B-12  1,000 mcg Oral Daily    Dialysis Orders:  CCPD 6 exchanges 2.7L fill. 1.5h dwell No day dwell.(Patient says she has been doing 5 exchanges)  Assessment/Plan: 1. CAD/Unstable angina. Known multivessel disease. LHC 2/12 with progressive disease, occlusion of recent OM stent. Cardiology following - medical therapy for now. Plan to add LA nitrate.  2. ESRD -  Cr 15.5. Continue CCPD. 3. Hypokalemia - Now replete. Monitor.  4. Hypertension/volume  - BP/volume stable. Using combination 2.5/1.5.%  5. Anemia  - Hgb 11. No ESA needs. Follow  trends.  6. Metabolic bone disease -  Ca ok. Continue home binder.  7. Nutrition - Renal diet/vitamins  Lynnda Child PA-C [facility] Pager [phone] 03/04/2019,9:40 AM  LOS: 1 day

## 2019-03-04 NOTE — Progress Notes (Signed)
[place] for Heparin Indication: chest pain/ACS  Allergies  Allergen Reactions  . Amlodipine Swelling  . Atorvastatin     Elevated LFT's  . Clonidine Derivatives Swelling    Limbs swell  . Doxycycline Nausea And Vomiting    "I threw up for 3 hours"  . Welchol [Colesevelam Hcl] Nausea Only    Patient Measurements: Height: 5' 5"  (165.1 cm) Weight: 274 lb 14.4 oz (124.7 kg) IBW/kg (Calculated) : 57 Heparin Dosing Weight: 87.6kg  Vital Signs: Temp: 98.1 F (36.7 C) (02/13 0403) Temp Source: Oral (02/13 0403) BP: 107/74 (02/13 0403) Pulse Rate: 81 (02/13 0403)  Labs: Recent Labs    03/02/19 1720 03/02/19 1720 03/02/19 1920 03/03/19 0622 03/03/19 1146 03/04/19 0826  HGB 12.0   < >  --  10.9*  --  11.3*  HCT 38.0  --   --  34.0*  --  35.8*  PLT 479*  --   --  412*  --  476*  HEPARINUNFRC  --   --   --  0.55 0.50 0.37  CREATININE 14.41*  --   --  15.56*  --  15.43*  TROPONINIHS 119*  --  132*  --   --   --    < > = values in this interval not displayed.    Estimated Creatinine Clearance: 5.5 mL/min (A) (by C-G formula based on SCr of 15.43 mg/dL (H)).   Medical History: Past Medical History:  Diagnosis Date  . Anemia   . CAD (coronary artery disease)    a. cath in 02/2018 showing moderate 3-vessel CAD with 45% mid-LADm 55% LCx and 65% RCA stenosis which was not significant by FFR  . ESRD on dialysis ([place])   . Gout   . HCAP (healthcare-associated pneumonia) 05/06/2016  . History of blood transfusion    "related to surgery"  . Hyperlipidemia   . Hypertension   . Morbid obesity ([place])   . Pain    LEFT SHOULDER PAIN - WAS SEEN AT AN URGENT CARE - GIVEN SLING FOR COMFORT AND TOLD ROM AS TOLERATED.  Marland Kitchen Palpitations 09/24/2016  . Peritonitis, dialysis-associated ([place])   . Type II diabetes mellitus ([place])    "gastric sleeve OR corrected this" (05/06/2016)     Assessment: Patient is a 20 yom that presents to the ED with chest  pain. The patient has a hx of CAD, ESRD, HTN, DM, and anemia. Patients Trop has been trending up at this time. Pharmacy has been asked to dose heparin for ACS.  She is now s/p cath 2/12 with stent thrombosis of the OM stent for medical treatment. Plans noted for heparin for 24 hours  Goal of Therapy:  Heparin level 0.3-0.7 units/ml Monitor platelets by anticoagulation protocol: Yes   Plan:  -No heparin changes needed -Daily heparin level and CBC  Hildred Laser, PharmD Clinical Pharmacist **Pharmacist phone directory can now be found on [email or web address] ([email or web address]).  Listed under [place].

## 2019-03-04 NOTE — Progress Notes (Signed)
Progress Note    ROLA Davis  CZY:[id number] DOB: July 30, 1965  DOA: 03/02/2019 PCP: [name], MD    Brief Narrative:   Chief complaint: chest pain  Medical records reviewed and are as summarized below:  Brenda Davis is an 54 y.o. female  with the past medical history of end-stage renal disease on peritoneal dialysis, CAD status post multiple prior PCI, chronic diastolic heart failure, diabetes, hyperlipidemia presented to the emergency department February 11 with chief complaint of chest pain.  Work-up revealed elevated troponin concerning for NSTEMI.  Patient is status post heart cath with plans of medical management.  Assessment/Plan:   Principal Problem:   Coronary artery disease involving native coronary artery of native heart with unstable angina pectoris ([place]) Active Problems:   Morbid obesity with BMI of 40.0-44.9, adult (HCC)   Anemia of chronic disease   Chronic heart failure with preserved ejection fraction (HCC)   ESRD on peritoneal dialysis ([facility])   DM (diabetes mellitus), type 2 with renal complications (HCC)   NSTEMI (non-ST elevated myocardial infarction) (HCC)   Elevated troponin   Chest pain   Unstable angina ([place])  Unstable angina: CAD status post recent PCI/DES 01/17/2019.  -Continue aspirin and Brilinta -Continue heparin drip for another 24 hours -Start nitrates -Continue statin -Status post cath:  Medical therapy for stent thrombosis of the OM stent. Given the severe tortuosity, I don't think repeat PCI attempt would be beneficial. There was significant difficulty getting a stent delivered during the last cath. I think there would be a high likelihood of completely occluding the vessel with wire manipulation. .   -Appreciate cardiology assistance: Suspect she can go home tomorrow    Hypertension.  Blood pressure low end of normal at times.  - Chart review indicates she has been unable to tolerate beta-blocker due to  hypotension.     Chronic diastolic heart failure.  Appears compensated.  Volume is managed with peritoneal dialysis. -Status post dialysis last p.m.  End-stage renal disease.  Patient on peritoneal dialysis. Creatinine 15.56.  Chart review indicates this is a little above her baseline.-Nephrology following    Diabetes II.  Hemoglobin A1c 7.2 last month.  Home medications include Metformin.  Serum glucose 155 on admission. -Hold Metformin -Sliding scale insulin sensitive level -Monitor   Morbid obesity.  BMI 45.7. -Weight loss encouraged  GERD -Added PPI -We will try GI cocktail if patient has chest pain again  Family Communication/Anticipated D/C date and plan/Code Status   Code Status: Full Code.  Family Communication: patient  Disposition Plan: home in a.m.: IV heparin for 24 hours and needs to be pain-free   Medical Consultants:    Nephrology  Cardiology     Subjective:   Complaining of chest pain that she attributes to acid reflux  Objective:    Vitals:   03/03/19 1327 03/03/19 1954 03/04/19 0020 03/04/19 0403  BP: 113/63 131/80 110/69 107/74  Pulse: 80 87 91 81  Resp: 12 17 13 14   Temp:  98.3 F (36.8 C)  98.1 F (36.7 C)  TempSrc:  Oral  Oral  SpO2: 100% 100% 96% 97%  Weight:      Height:        Intake/Output Summary (Last 24 hours) at 03/04/2019 1059 Last data filed at 03/03/2019 1917 Gross per 24 hour  Intake 300 ml  Output --  Net 300 ml   Filed Weights   03/02/19 2230 03/03/19 0331  Weight: 125.6 kg 124.7 kg  Exam: Sitting on side of bed, morbidly obese Regular rate and rhythm No increased work of breathing Alert and orient x3  Data Reviewed:   I have personally reviewed following labs and imaging studies:  Labs: Labs show the following:   Basic Metabolic Panel: Recent Labs  Lab 03/02/19 1720 03/02/19 1720 03/03/19 0057 03/03/19 0622 03/04/19 0826  NA 139  --   --  141 138  K 3.3*   < >  --  3.2* 3.7  CL 97*  --    --  99 97*  CO2 27  --   --  26 23  GLUCOSE 155*  --   --  98 108*  BUN 46*  --   --  49* 49*  CREATININE 14.41*  --   --  15.56* 15.43*  CALCIUM 8.7*  --   --  8.2* 7.8*  MG  --   --  1.8  --   --   PHOS  --   --  6.2*  --   --    < > = values in this interval not displayed.   GFR Estimated Creatinine Clearance: 5.5 mL/min (A) (by C-G formula based on SCr of 15.43 mg/dL (H)). Liver Function Tests: No results for input(s): AST, ALT, ALKPHOS, BILITOT, PROT, ALBUMIN in the last 168 hours. No results for input(s): LIPASE, AMYLASE in the last 168 hours. No results for input(s): AMMONIA in the last 168 hours. Coagulation profile No results for input(s): INR, PROTIME in the last 168 hours.  CBC: Recent Labs  Lab 03/02/19 1720 03/03/19 0622 03/04/19 0826  WBC 9.8 9.6 9.5  NEUTROABS 7.0  --   --   HGB 12.0 10.9* 11.3*  HCT 38.0 34.0* 35.8*  MCV 100.5* 98.6 98.9  PLT 479* 412* 476*   Cardiac Enzymes: No results for input(s): CKTOTAL, CKMB, CKMBINDEX, TROPONINI in the last 168 hours. BNP (last 3 results) No results for input(s): PROBNP in the last 8760 hours. CBG: Recent Labs  Lab 03/03/19 0837 03/03/19 1626 03/03/19 2211 03/04/19 0735  GLUCAP 98 137* 124* 97   D-Dimer: No results for input(s): DDIMER in the last 72 hours. Hgb A1c: No results for input(s): HGBA1C in the last 72 hours. Lipid Profile: No results for input(s): CHOL, HDL, LDLCALC, TRIG, CHOLHDL, LDLDIRECT in the last 72 hours. Thyroid function studies: No results for input(s): TSH, T4TOTAL, T3FREE, THYROIDAB in the last 72 hours.  Invalid input(s): FREET3 Anemia work up: No results for input(s): VITAMINB12, FOLATE, FERRITIN, TIBC, IRON, RETICCTPCT in the last 72 hours. Sepsis Labs: Recent Labs  Lab 03/02/19 1720 03/03/19 0622 03/04/19 0826  WBC 9.8 9.6 9.5    Microbiology Recent Results (from the past 240 hour(s))  SARS CORONAVIRUS 2 (TAT 6-24 HRS) Nasopharyngeal Nasopharyngeal Swab     Status:  None   Collection Time: 03/02/19 11:47 PM   Specimen: Nasopharyngeal Swab  Result Value Ref Range Status   SARS Coronavirus 2 NEGATIVE NEGATIVE Final    Comment: (NOTE) SARS-CoV-2 target nucleic acids are NOT DETECTED. The SARS-CoV-2 RNA is generally detectable in upper and lower respiratory specimens during the acute phase of infection. Negative results do not preclude SARS-CoV-2 infection, do not rule out co-infections with other pathogens, and should not be used as the sole basis for treatment or other patient management decisions. Negative results must be combined with clinical observations, patient history, and epidemiological information. The expected result is Negative. Fact Sheet for Patients: SugarRoll.be Fact Sheet for Healthcare Providers: https://www.woods-mathews.com/ This  test is not yet approved or cleared by the [place] and  has been authorized for detection and/or diagnosis of SARS-CoV-2 by FDA under an Emergency Use Authorization (EUA). This EUA will remain  in effect (meaning this test can be used) for the duration of the COVID-19 declaration under Section 56 4(b)(1) of the Act, 21 U.S.C. section 360bbb-3(b)(1), unless the authorization is terminated or revoked sooner. Performed at [facility], [place] [address], [place], [place] [address]     Procedures and diagnostic studies:  DG Chest 2 View  Result Date: 03/02/2019 CLINICAL DATA:  Chest pain EXAM: CHEST - 2 VIEW COMPARISON:  02/11/2019 FINDINGS: Heart and mediastinal contours are within normal limits. No focal opacities or effusions. No acute bony abnormality. IMPRESSION: No active cardiopulmonary disease. Electronically Signed   By: Rolm Baptise M.D.   On: 03/02/2019 17:40   CARDIAC CATHETERIZATION  Result Date: 03/03/2019  Mid LAD lesion is 75% stenosed.  Ost LAD to Mid LAD lesion is 40% stenosed.  Previously placed Ost 1st Mrg drug eluting stent  is widely patent.  Mid Cx to Dist Cx lesion is 55% stenosed with 40% stenosed side branch in Ost 3rd Mrg.  Previously placed Prox RCA drug eluting stent is widely patent.  Ost 1st Mrg to 1st Mrg lesion is 99% stenosed. Partially in the stent and partially distal to the stent- subacte stent thrombosis.  LV end diastolic pressure is normal.  There is no aortic valve stenosis.  Medical therapy for stent thrombosis of the OM stent.  Given the severe tortuosity, I don't think repeat PCI attempt would be beneficial.  There was significant difficulty getting a stent delivered during the last cath.  I think there would be a high likelihood of completely occluding the vessel with wire manipulation. .    Medications:   . aspirin EC  81 mg Oral Daily  . calcium acetate  667 mg Oral TID WC  . cinacalcet  90 mg Oral QHS  . ferric citrate  420 mg Oral TID WC  . folic acid  1 mg Oral Daily  . gabapentin  100 mg Oral BID  . gentamicin cream  1 application Topical Daily  . insulin aspart  0-6 Units Subcutaneous TID WC  . isosorbide mononitrate  15 mg Oral Daily  . nitroGLYCERIN      . pantoprazole  40 mg Oral Daily  . rosuvastatin  10 mg Oral Daily  . ticagrelor  90 mg Oral BID  . vitamin B-12  1,000 mcg Oral Daily   Continuous Infusions: . sodium chloride 10 mL/hr at 03/03/19 0800  . heparin 1,000 Units/hr (03/03/19 2212)     LOS: 1 day   Geradine Girt NP  [facility]   How to contact the [facility] Attending or Consulting provider [place] or covering provider during after hours [place], for this patient?  1. Check the care team in [facility] and look for a) attending/consulting [facility] provider listed and b) the [facility] team listed 2. Log into [email or web address] and use [place] universal password to access. If you do not have the password, please contact the hospital operator. 3. Locate the [facility] provider you are looking for under [facility] and page to a number that you can be directly  reached. 4. If you still have difficulty reaching the provider, please page the [facility] (Director on Call) for the Hospitalists listed on amion for assistance.  03/04/2019, 10:59 AM

## 2019-03-04 NOTE — Progress Notes (Signed)
Continues to have epigastric pain after gi cocktail given. States that pain is radiating to neck now. C/O h/a as well. Dr Eliseo Squires notified. Order received for Morphine 2 mg IV x 1.

## 2019-03-04 NOTE — Progress Notes (Signed)
C/O Rt arm tenderness. Swelling noted just below AC.  Rt arm IV with heparin gtt infusing. No s/s infiltration at PIV site. ICE pack placed.

## 2019-03-04 NOTE — Progress Notes (Signed)
Pt denied needs and pain. Primary RN notified me that the pt was experiencing some pain, but that he took care of it. Will continue to monitor.

## 2019-03-04 NOTE — Progress Notes (Signed)
During shift end report, pt reported chest pain 10/10.  Pt states  Nitroglycerin helps this pain more than anything else.  SBP 150s. SL NTG given x 2 given 5 minutes apart.  SBP after 2 SL NTG 123.  Pt reported relief with NTG.

## 2019-03-04 NOTE — Progress Notes (Signed)
Progress Note  Patient Name: Brenda Davis Date of Encounter: 03/04/2019  Primary Cardiologist: Skeet Latch, MD   Subjective   Nausea this am when distracted tops moaning   Inpatient Medications    Scheduled Meds: . aspirin EC  81 mg Oral Daily  . calcium acetate  667 mg Oral TID WC  . cinacalcet  90 mg Oral QHS  . ferric citrate  420 mg Oral TID WC  . folic acid  1 mg Oral Daily  . gabapentin  100 mg Oral BID  . gentamicin cream  1 application Topical Daily  . insulin aspart  0-6 Units Subcutaneous TID WC  . nitroGLYCERIN      . rosuvastatin  10 mg Oral Daily  . ticagrelor  90 mg Oral BID  . vitamin B-12  1,000 mcg Oral Daily   Continuous Infusions: . sodium chloride 10 mL/hr at 03/03/19 0800  . heparin 1,000 Units/hr (03/03/19 2212)   PRN Meds: acetaminophen, gentamicin cream, dianeal solution for CAPD/CCPD with heparin, dianeal solution for CAPD/CCPD with heparin, HYDROcodone-acetaminophen, nitroGLYCERIN, ondansetron (ZOFRAN) IV   Vital Signs    Vitals:   03/03/19 1327 03/03/19 1954 03/04/19 0020 03/04/19 0403  BP: 113/63 131/80 110/69 107/74  Pulse: 80 87 91 81  Resp: 12 17 13 14   Temp:  98.3 F (36.8 C)  98.1 F (36.7 C)  TempSrc:  Oral  Oral  SpO2: 100% 100% 96% 97%  Weight:      Height:        Intake/Output Summary (Last 24 hours) at 03/04/2019 0827 Last data filed at 03/03/2019 1917 Gross per 24 hour  Intake 300 ml  Output --  Net 300 ml   Filed Weights   03/02/19 2230 03/03/19 0331  Weight: 125.6 kg 124.7 kg    Telemetry    NSR - Personally Reviewed  ECG    NSR, no STE/D, no TWI, rate 78 bpm, QTc 513 - Personally Reviewed  Physical Exam   Affect appropriate Overweight black female  HEENT: normal Neck supple with no adenopathy JVP normal no bruits no thyromegaly Lungs clear with no wheezing and good diaphragmatic motion Heart:  S1/S2 no murmur, no rub, gallop or click PMI normal Abdomen: benighn,peritoneal  dialysis cath Distal pulses intact with no bruits No edema Neuro non-focal Skin warm and dry Fistula in LUE Right radial mildly tender no hematoma    Labs    Chemistry Recent Labs  Lab 03/02/19 1720 03/03/19 0622  NA 139 141  K 3.3* 3.2*  CL 97* 99  CO2 27 26  GLUCOSE 155* 98  BUN 46* 49*  CREATININE 14.41* 15.56*  CALCIUM 8.7* 8.2*  GFRNONAA 3* 2*  GFRAA 3* 3*  ANIONGAP 15 16*     Hematology Recent Labs  Lab 03/02/19 1720 03/03/19 0622  WBC 9.8 9.6  RBC 3.78* 3.45*  HGB 12.0 10.9*  HCT 38.0 34.0*  MCV 100.5* 98.6  MCH 31.7 31.6  MCHC 31.6 32.1  RDW 16.1* 15.9*  PLT 479* 412*      Radiology    DG Chest 2 View  Result Date: 03/02/2019 CLINICAL DATA:  Chest pain EXAM: CHEST - 2 VIEW COMPARISON:  02/11/2019 FINDINGS: Heart and mediastinal contours are within normal limits. No focal opacities or effusions. No acute bony abnormality. IMPRESSION: No active cardiopulmonary disease. Electronically Signed   By: Rolm Baptise M.D.   On: 03/02/2019 17:40   CARDIAC CATHETERIZATION  Result Date: 03/03/2019  Mid LAD lesion is  75% stenosed.  Ost LAD to Mid LAD lesion is 40% stenosed.  Previously placed Ost 1st Mrg drug eluting stent is widely patent.  Mid Cx to Dist Cx lesion is 55% stenosed with 40% stenosed side branch in Ost 3rd Mrg.  Previously placed Prox RCA drug eluting stent is widely patent.  Ost 1st Mrg to 1st Mrg lesion is 99% stenosed. Partially in the stent and partially distal to the stent- subacte stent thrombosis.  LV end diastolic pressure is normal.  There is no aortic valve stenosis.  Medical therapy for stent thrombosis of the OM stent.  Given the severe tortuosity, I don't think repeat PCI attempt would be beneficial.  There was significant difficulty getting a stent delivered during the last cath.  I think there would be a high likelihood of completely occluding the vessel with wire manipulation. .    Cardiac Studies   Cath 03/03/19  .  Conclusion    Mid LAD lesion is 75% stenosed.  Ost LAD to Mid LAD lesion is 40% stenosed.  Previously placed Ost 1st Mrg drug eluting stent is widely patent.  Mid Cx to Dist Cx lesion is 55% stenosed with 40% stenosed side branch in Ost 3rd Mrg.  Previously placed Prox RCA drug eluting stent is widely patent.  Ost 1st Mrg to 1st Mrg lesion is 99% stenosed. Partially in the stent and partially distal to the stent- subacte stent thrombosis.  LV end diastolic pressure is normal.  There is no aortic valve stenosis.   Medical therapy for stent thrombosis of the OM stent.  Given the severe tortuosity, I don't think repeat PCI attempt would be beneficial.  There was significant difficulty getting a stent delivered during the last cath.  I think there would be a high likelihood of completely occluding the vessel with wire manipulation. .       Patient Profile     54 y.o. female with PMH of severe multivessel CAD s/p PCI/DES to RCA and OM1 03/47/4259, chronic diastolic CHF, HTN, HLD, DM type 2, who is being followed by cardiology for the evaluation of chest pain.   Assessment & Plan    1.Unstable angin:  CAD s/p recent PCI/DES to RCA and OM1 01/17/2019:  Cath 2/12 with occluded stent to OM Decision made to Rx medically Continue heparin 24 hours ASA/Brillinta Dr Tamala Julian indicated starting nitrates not done yet   2. HTN: Well controlled.  Continue current medications and low sodium Dash type diet.    3. HLD: continue statin   4. Chronic diastolic CHF: euvolemic had PD last night   5. DM type 2: A1C 7.2 12/2018  On insulin    6. ESRD on PD: Nephrology following right radial used for cath due to poor healing RFA previous cath    Can be d/c in am if she has a good day   For questions or updates, please contact [place] Please consult [email or web address] for contact info under Cardiology/STEMI.      Signed, Jenkins Rouge, MD  03/04/2019, 8:27 AM   [phone]

## 2019-03-05 ENCOUNTER — Other Ambulatory Visit: Payer: Self-pay

## 2019-03-05 LAB — CBC
HCT: 34.9 % — ABNORMAL LOW (ref 36.0–46.0)
Hemoglobin: 11.1 g/dL — ABNORMAL LOW (ref 12.0–15.0)
MCH: 31.6 pg (ref 26.0–34.0)
MCHC: 31.8 g/dL (ref 30.0–36.0)
MCV: 99.4 fL (ref 80.0–100.0)
Platelets: 430 10*3/uL — ABNORMAL HIGH (ref 150–400)
RBC: 3.51 MIL/uL — ABNORMAL LOW (ref 3.87–5.11)
RDW: 16.3 % — ABNORMAL HIGH (ref 11.5–15.5)
WBC: 9.9 10*3/uL (ref 4.0–10.5)
nRBC: 0.5 % — ABNORMAL HIGH (ref 0.0–0.2)

## 2019-03-05 LAB — GLUCOSE, CAPILLARY
Glucose-Capillary: 119 mg/dL — ABNORMAL HIGH (ref 70–99)
Glucose-Capillary: 160 mg/dL — ABNORMAL HIGH (ref 70–99)
Glucose-Capillary: 85 mg/dL (ref 70–99)
Glucose-Capillary: 90 mg/dL (ref 70–99)
Glucose-Capillary: 106 mg/dL — ABNORMAL HIGH (ref 70–99)

## 2019-03-05 LAB — TROPONIN I (HIGH SENSITIVITY): Troponin I (High Sensitivity): 929 ng/L (ref <18)

## 2019-03-05 LAB — HEPARIN LEVEL (UNFRACTIONATED): Heparin Unfractionated: 0.53 IU/mL (ref 0.30–0.70)

## 2019-03-05 MED ORDER — HEPARIN 1000 UNIT/ML FOR PERITONEAL DIALYSIS: 500.0000 [IU] | INTRAMUSCULAR | Status: DC | PRN

## 2019-03-05 MED ORDER — MORPHINE SULFATE (PF) 2 MG/ML IV SOLN
2.0000 mg | INTRAVENOUS | Status: DC | PRN
  Administered 2019-03-05 – 2019-03-08 (×3): 2 mg via INTRAVENOUS
  Filled 2019-03-05 (×4): qty 1

## 2019-03-05 MED ORDER — MORPHINE SULFATE (PF) 2 MG/ML IV SOLN
1.0000 mg | INTRAVENOUS | Status: DC | PRN
  Administered 2019-03-05 (×3): 2 mg via INTRAVENOUS
  Filled 2019-03-05 (×3): qty 1

## 2019-03-05 NOTE — Progress Notes (Signed)
R forearm measures 27.5cm. L forearm measures 23cm. R forearm elevated on pillow. Tender.Bilateral radial pulses +2.

## 2019-03-05 NOTE — Progress Notes (Signed)
Progress Note    Brenda Davis  FYB:[id number] DOB: [birth year]  DOA: 03/02/2019 PCP: [name], MD    Brief Narrative:   Chief complaint: chest pain  Medical records reviewed and are as summarized below:  Brenda Davis is an 54 y.o. female  with the past medical history of end-stage renal disease on peritoneal dialysis, CAD status post multiple prior PCI, chronic diastolic heart failure, diabetes, hyperlipidemia presented to the emergency department February 11 with chief complaint of chest pain.  Work-up revealed elevated troponin concerning for NSTEMI.  Patient is status post heart cath with plans of medical management.  Assessment/Plan:   Principal Problem:   Coronary artery disease involving native coronary artery of native heart with unstable angina pectoris ([place]) Active Problems:   Morbid obesity with BMI of 40.0-44.9, adult (HCC)   Anemia of chronic disease   Chronic heart failure with preserved ejection fraction (HCC)   ESRD on peritoneal dialysis ([place])   DM (diabetes mellitus), type 2 with renal complications (HCC)   NSTEMI (non-ST elevated myocardial infarction) (HCC)   Elevated troponin   Chest pain   Unstable angina ([place])   Unstable angina: CAD status post recent PCI/DES 01/17/2019.  -Continue aspirin and Brilinta -DC heparin today -Repeat troponin -Start nitrates -Continue statin -Status post cath on 2/12:  Medical therapy for stent thrombosis of the OM stent. Given the severe tortuosity, I don't think repeat PCI attempt would be beneficial. There was significant difficulty getting a stent delivered during the last cath. I think there would be a high likelihood of completely occluding the vessel with wire manipulation. .   -Appreciate cardiology assistance  Right forearm swelling and pain -We will measure and monitor -We will elevate above level of heart -We will get limited right forearm ultrasound if measurements of that  forearm increase   Hypertension.  Blood pressure low end of normal at times.  - Chart review indicates she has been unable to tolerate beta-blocker due to hypotension.     Chronic diastolic heart failure.  Appears compensated.  Volume is managed with peritoneal dialysis.  End-stage renal disease.  Patient on peritoneal dialysis. Creatinine 15.56.  Chart review indicates this is a little above her baseline. -Nephrology following    Diabetes II.  Hemoglobin A1c 7.2 last month.  Home medications include Metformin.  Serum glucose 155 on admission. -Hold Metformin -Sliding scale insulin sensitive level -Monitor   Morbid obesity.  Estimated body mass index is 45.64 kg/m as calculated from the following:   Height as of this encounter: 5' 5"  (1.651 m).   Weight as of this encounter: 124.4 kg. -Weight loss encouraged  GERD -Added PPI -No complaints of GERD this a.m.  Family Communication/Anticipated D/C date and plan/Code Status   Code Status: Full Code.  Family Communication: patient  Disposition Plan: Labs pending   Medical Consultants:    Nephrology  Cardiology     Subjective:   Complaining of right forearm swelling and severe pain  Objective:    Vitals:   03/05/19 0804 03/05/19 0855 03/05/19 1000 03/05/19 1007  BP: 100/65 112/73 (!) 150/74 115/72  Pulse: 85 79 78 80  Resp: 14 20 19 16   Temp:  97.8 F (36.6 C)    TempSrc:  Oral    SpO2: 93% 94% 97% 93%  Weight:  124.4 kg    Height:        Intake/Output Summary (Last 24 hours) at 03/05/2019 1038 Last data filed at 03/05/2019 1000  Gross per 24 hour  Intake 14107.73 ml  Output 1440 ml  Net 12667.73 ml   Filed Weights   03/02/19 2230 03/03/19 0331 03/05/19 0855  Weight: 125.6 kg 124.7 kg 124.4 kg    Exam: In bed, no acute distress Right forearm is tender to touch and appears to be larger than left Regular rate and rhythm No increased work of breathing Positive bowel sounds, morbidly obese Alert and  oriented x3  Data Reviewed:   I have personally reviewed following labs and imaging studies:  Labs: Labs show the following:   Basic Metabolic Panel: Recent Labs  Lab 03/02/19 1720 03/02/19 1720 03/03/19 0057 03/03/19 0622 03/04/19 0826  NA 139  --   --  141 138  K 3.3*   < >  --  3.2* 3.7  CL 97*  --   --  99 97*  CO2 27  --   --  26 23  GLUCOSE 155*  --   --  98 108*  BUN 46*  --   --  49* 49*  CREATININE 14.41*  --   --  15.56* 15.43*  CALCIUM 8.7*  --   --  8.2* 7.8*  MG  --   --  1.8  --   --   PHOS  --   --  6.2*  --   --    < > = values in this interval not displayed.   GFR Estimated Creatinine Clearance: 5.5 mL/min (A) (by C-G formula based on SCr of 15.43 mg/dL (H)). Liver Function Tests: No results for input(s): AST, ALT, ALKPHOS, BILITOT, PROT, ALBUMIN in the last 168 hours. No results for input(s): LIPASE, AMYLASE in the last 168 hours. No results for input(s): AMMONIA in the last 168 hours. Coagulation profile No results for input(s): INR, PROTIME in the last 168 hours.  CBC: Recent Labs  Lab 03/02/19 1720 03/03/19 0622 03/04/19 0826  WBC 9.8 9.6 9.5  NEUTROABS 7.0  --   --   HGB 12.0 10.9* 11.3*  HCT 38.0 34.0* 35.8*  MCV 100.5* 98.6 98.9  PLT 479* 412* 476*   Cardiac Enzymes: No results for input(s): CKTOTAL, CKMB, CKMBINDEX, TROPONINI in the last 168 hours. BNP (last 3 results) No results for input(s): PROBNP in the last 8760 hours. CBG: Recent Labs  Lab 03/04/19 0735 03/04/19 1152 03/04/19 1637 03/04/19 2205 03/05/19 0734  GLUCAP 97 114* 100* 125* 119*   D-Dimer: No results for input(s): DDIMER in the last 72 hours. Hgb A1c: No results for input(s): HGBA1C in the last 72 hours. Lipid Profile: No results for input(s): CHOL, HDL, LDLCALC, TRIG, CHOLHDL, LDLDIRECT in the last 72 hours. Thyroid function studies: No results for input(s): TSH, T4TOTAL, T3FREE, THYROIDAB in the last 72 hours.  Invalid input(s): FREET3 Anemia work  up: No results for input(s): VITAMINB12, FOLATE, FERRITIN, TIBC, IRON, RETICCTPCT in the last 72 hours. Sepsis Labs: Recent Labs  Lab 03/02/19 1720 03/03/19 0622 03/04/19 0826  WBC 9.8 9.6 9.5    Microbiology Recent Results (from the past 240 hour(s))  SARS CORONAVIRUS 2 (TAT 6-24 HRS) Nasopharyngeal Nasopharyngeal Swab     Status: None   Collection Time: 03/02/19 11:47 PM   Specimen: Nasopharyngeal Swab  Result Value Ref Range Status   SARS Coronavirus 2 NEGATIVE NEGATIVE Final    Comment: (NOTE) SARS-CoV-2 target nucleic acids are NOT DETECTED. The SARS-CoV-2 RNA is generally detectable in upper and lower respiratory specimens during the acute phase of infection. Negative results do  not preclude SARS-CoV-2 infection, do not rule out co-infections with other pathogens, and should not be used as the sole basis for treatment or other patient management decisions. Negative results must be combined with clinical observations, patient history, and epidemiological information. The expected result is Negative. Fact Sheet for Patients: SugarRoll.be Fact Sheet for Healthcare Providers: https://www.woods-mathews.com/ This test is not yet approved or cleared by the [place] FDA and  has been authorized for detection and/or diagnosis of SARS-CoV-2 by FDA under an Emergency Use Authorization (EUA). This EUA will remain  in effect (meaning this test can be used) for the duration of the COVID-19 declaration under Section 56 4(b)(1) of the Act, 21 U.S.C. section 360bbb-3(b)(1), unless the authorization is terminated or revoked sooner. Performed at [facility], [place] [address] [place], [place] [address]     Procedures and diagnostic studies:  CARDIAC CATHETERIZATION  Result Date: 03/03/2019  Mid LAD lesion is 75% stenosed.  Ost LAD to Mid LAD lesion is 40% stenosed.  Previously placed Ost 1st Mrg drug eluting stent is widely  patent.  Mid Cx to Dist Cx lesion is 55% stenosed with 40% stenosed side branch in Ost 3rd Mrg.  Previously placed Prox RCA drug eluting stent is widely patent.  Ost 1st Mrg to 1st Mrg lesion is 99% stenosed. Partially in the stent and partially distal to the stent- subacte stent thrombosis.  LV end diastolic pressure is normal.  There is no aortic valve stenosis.  Medical therapy for stent thrombosis of the OM stent.  Given the severe tortuosity, I don't think repeat PCI attempt would be beneficial.  There was significant difficulty getting a stent delivered during the last cath.  I think there would be a high likelihood of completely occluding the vessel with wire manipulation. .    Medications:   . aspirin EC  81 mg Oral Daily  . calcium acetate  667 mg Oral TID WC  . cinacalcet  90 mg Oral QHS  . ferric citrate  420 mg Oral TID WC  . folic acid  1 mg Oral Daily  . gabapentin  100 mg Oral BID  . gentamicin cream  1 application Topical Daily  . hydrocortisone   Topical QID  . insulin aspart  0-6 Units Subcutaneous TID WC  . isosorbide dinitrate  10 mg Oral BID  . pantoprazole  40 mg Oral BID AC  . rosuvastatin  10 mg Oral Daily  . ticagrelor  90 mg Oral BID  . vitamin B-12  1,000 mcg Oral Daily   Continuous Infusions: . sodium chloride 10 mL/hr at 03/03/19 0800  . heparin 1,000 Units/hr (03/05/19 1000)     LOS: 2 days   Geradine Girt NP  [facility]   How to contact the [facility] Attending or Consulting provider [place] or covering provider during after hours [place], for this patient?  1. Check the care team in [facility] and look for a) attending/consulting TR[facility] provider listed and b) the [facility] team listed 2. Log into [email or web address] and use [place]'s universal password to access. If you do not have the password, please contact the hospital operator. 3. Locate the [facility] provider you are looking for under [facility] and page to a number that you can be directly  reached. 4. If you still have difficulty reaching the provider, please page the [facility] (Director on Call) for the Hospitalists listed on amion for assistance.  03/05/2019, 10:38 AM

## 2019-03-05 NOTE — Progress Notes (Signed)
Patient reports mid-sternal chest pain,non-radiating 8/10.  BP 113/97, HR 80, patient sitting in bed.  Given SL NTG and pain decreased to 5/10 after 5 minutes.  BP 120/68, HR 85, second SL NTG given.  Pain decreased to zero after 5 minutes.  BP 100/65, HR 81.  Dr. Sallyanne Kuster notified.

## 2019-03-05 NOTE — Progress Notes (Signed)
Patient states she ate small amount of cracker than became nauseated, had 30cc of yellow emesis.  Zofran 11m IV given.

## 2019-03-05 NOTE — Progress Notes (Signed)
[facility] Progress Note   Subjective:  Seen in room. No issues with PD. Having pain in R forearm after cath procedure. Also some ongoing nausea, indigestion this am. Feels like needs to belch.   Objective Vitals:   03/05/19 0804 03/05/19 0855 03/05/19 1000 03/05/19 1007  BP: 100/65 112/73 (!) 150/74 115/72  Pulse: 85 79 78 80  Resp: 14 20 19 16   Temp:  97.8 F (36.6 C)    TempSrc:  Oral    SpO2: 93% 94% 97% 93%  Weight:  124.4 kg    Height:        Weight change:   124.4 kg  Additional Objective Labs: Basic Metabolic Panel: Recent Labs  Lab 03/02/19 1720 03/03/19 0057 03/03/19 0622 03/04/19 0826  NA 139  --  141 138  K 3.3*  --  3.2* 3.7  CL 97*  --  99 97*  CO2 27  --  26 23  GLUCOSE 155*  --  98 108*  BUN 46*  --  49* 49*  CREATININE 14.41*  --  15.56* 15.43*  CALCIUM 8.7*  --  8.2* 7.8*  PHOS  --  6.2*  --   --    CBC: Recent Labs  Lab 03/02/19 1720 03/03/19 0622 03/04/19 0826  WBC 9.8 9.6 9.5  NEUTROABS 7.0  --   --   HGB 12.0 10.9* 11.3*  HCT 38.0 34.0* 35.8*  MCV 100.5* 98.6 98.9  PLT 479* 412* 476*   Blood Culture    Component Value Date/Time   SDES BLOOD RIGHT HAND 01/18/2019 0628   SPECREQUEST  01/18/2019 7026    BOTTLES DRAWN AEROBIC ONLY Blood Culture results may not be optimal due to an inadequate volume of blood received in culture bottles   CULT  01/18/2019 0628    NO GROWTH 5 DAYS Performed at [facility], [place] [address] [place], [place] [address]    REPTSTATUS 01/23/2019 FINAL 01/18/2019 8502     Physical Exam General: alert, sitting up in bed, nad  Heart: RRR no m,r,g Lungs: Clear bilaterally  Abdomen: soft, non-tender; PD cath in place  Extremities: no LE edema  Dialysis Access: PD cath + LUE AVF +bruit   Medications: . sodium chloride 10 mL/hr at 03/03/19 0800  . heparin 1,000 Units/hr (03/05/19 1000)   . aspirin EC  81 mg Oral Daily  . calcium acetate  667 mg Oral TID WC  . cinacalcet  90 mg  Oral QHS  . ferric citrate  420 mg Oral TID WC  . folic acid  1 mg Oral Daily  . gabapentin  100 mg Oral BID  . gentamicin cream  1 application Topical Daily  . hydrocortisone   Topical QID  . insulin aspart  0-6 Units Subcutaneous TID WC  . isosorbide dinitrate  10 mg Oral BID  . pantoprazole  40 mg Oral BID AC  . rosuvastatin  10 mg Oral Daily  . ticagrelor  90 mg Oral BID  . vitamin B-12  1,000 mcg Oral Daily    Dialysis Orders:  CCPD 6 exchanges 2.7L fill. 1.5h dwell No day dwell.(Patient says she has been doing 5 exchanges)  Assessment/Plan: 1. CAD/Unstable angina. Known multivessel disease. LHC 2/12 with progressive disease, occlusion of recent OM stent. Cardiology following - medical therapy for now. On aspirin/Brilinta.  Plan to add LA nitrate.  2. ESRD -  Cr 15.5. Continue CCPD. 3. Hypokalemia - Now replete. Monitor.  4. Hypertension/volume  - BP/volume  stable. Using combination 2.5/1.5.%  5. Anemia  - Hgb 11. No ESA needs. Follow trends.  6. Metabolic bone disease -  Ca ok. Continue home binder.  7. Nutrition - Renal diet/vitamins 8. Indigestion - PPI ordered   Lynnda Child PA-C [facility] Pager [phone] 03/05/2019,11:13 AM  LOS: 2 days

## 2019-03-05 NOTE — Progress Notes (Addendum)
Progress Note  Patient Name: Brenda Davis Date of Encounter: 03/05/2019  Primary Cardiologist: Skeet Latch, MD   Subjective   Eating breakfast has some chest pain earlier in morning Cath sight a bit sore  Inpatient Medications    Scheduled Meds: . aspirin EC  81 mg Oral Daily  . calcium acetate  667 mg Oral TID WC  . cinacalcet  90 mg Oral QHS  . ferric citrate  420 mg Oral TID WC  . folic acid  1 mg Oral Daily  . gabapentin  100 mg Oral BID  . gentamicin cream  1 application Topical Daily  . hydrocortisone   Topical QID  . insulin aspart  0-6 Units Subcutaneous TID WC  . isosorbide dinitrate  10 mg Oral BID  . pantoprazole  40 mg Oral BID AC  . rosuvastatin  10 mg Oral Daily  . ticagrelor  90 mg Oral BID  . vitamin B-12  1,000 mcg Oral Daily   Continuous Infusions: . sodium chloride 10 mL/hr at 03/03/19 0800  . heparin 1,000 Units/hr (03/05/19 0700)   PRN Meds: acetaminophen, diclofenac Sodium, gentamicin cream, dianeal solution for CAPD/CCPD with heparin, dianeal solution for CAPD/CCPD with heparin, HYDROcodone-acetaminophen, nitroGLYCERIN, ondansetron (ZOFRAN) IV   Vital Signs    Vitals:   03/05/19 0735 03/05/19 0752 03/05/19 0759 03/05/19 0804  BP:  (!) 113/97 120/68 100/65  Pulse: 80  85 85  Resp: 18 17 17 14   Temp: 97.8 F (36.6 C)     TempSrc: Oral     SpO2: 98%  93% 93%  Weight:      Height:        Intake/Output Summary (Last 24 hours) at 03/05/2019 0845 Last data filed at 03/05/2019 0700 Gross per 24 hour  Intake 131834.79 ml  Output 14034 ml  Net 117800.79 ml   Filed Weights   03/02/19 2230 03/03/19 0331  Weight: 125.6 kg 124.7 kg    Telemetry    NSR - Personally Reviewed  ECG    NSR, no STE/D, no TWI, rate 78 bpm, QTc 513 - Personally Reviewed  Physical Exam   Affect appropriate Overweight black female  HEENT: normal Neck supple with no adenopathy JVP normal no bruits no thyromegaly Lungs clear with no  wheezing and good diaphragmatic motion Heart:  S1/S2 no murmur, no rub, gallop or click PMI normal Abdomen: benighn,peritoneal dialysis cath Distal pulses intact with no bruits No edema Neuro non-focal Skin warm and dry Fistula in LUE Right radial mildly tender no hematoma    Labs    Chemistry Recent Labs  Lab 03/02/19 1720 03/03/19 0622 03/04/19 0826  NA 139 141 138  K 3.3* 3.2* 3.7  CL 97* 99 97*  CO2 27 26 23   GLUCOSE 155* 98 108*  BUN 46* 49* 49*  CREATININE 14.41* 15.56* 15.43*  CALCIUM 8.7* 8.2* 7.8*  GFRNONAA 3* 2* 2*  GFRAA 3* 3* 3*  ANIONGAP 15 16* 18*     Hematology Recent Labs  Lab 03/02/19 1720 03/03/19 0622 03/04/19 0826  WBC 9.8 9.6 9.5  RBC 3.78* 3.45* 3.62*  HGB 12.0 10.9* 11.3*  HCT 38.0 34.0* 35.8*  MCV 100.5* 98.6 98.9  MCH 31.7 31.6 31.2  MCHC 31.6 32.1 31.6  RDW 16.1* 15.9* 16.0*  PLT 479* 412* 476*      Radiology    CARDIAC CATHETERIZATION  Result Date: 03/03/2019  Mid LAD lesion is 75% stenosed.  Ost LAD to Mid LAD lesion is 40%  stenosed.  Previously placed Ost 1st Mrg drug eluting stent is widely patent.  Mid Cx to Dist Cx lesion is 55% stenosed with 40% stenosed side branch in Ost 3rd Mrg.  Previously placed Prox RCA drug eluting stent is widely patent.  Ost 1st Mrg to 1st Mrg lesion is 99% stenosed. Partially in the stent and partially distal to the stent- subacte stent thrombosis.  LV end diastolic pressure is normal.  There is no aortic valve stenosis.  Medical therapy for stent thrombosis of the OM stent.  Given the severe tortuosity, I don't think repeat PCI attempt would be beneficial.  There was significant difficulty getting a stent delivered during the last cath.  I think there would be a high likelihood of completely occluding the vessel with wire manipulation. .    Cardiac Studies   Cath 03/03/19  . Conclusion    Mid LAD lesion is 75% stenosed.  Ost LAD to Mid LAD lesion is 40% stenosed.  Previously  placed Ost 1st Mrg drug eluting stent is widely patent.  Mid Cx to Dist Cx lesion is 55% stenosed with 40% stenosed side branch in Ost 3rd Mrg.  Previously placed Prox RCA drug eluting stent is widely patent.  Ost 1st Mrg to 1st Mrg lesion is 99% stenosed. Partially in the stent and partially distal to the stent- subacte stent thrombosis.  LV end diastolic pressure is normal.  There is no aortic valve stenosis.   Medical therapy for stent thrombosis of the OM stent.  Given the severe tortuosity, I don't think repeat PCI attempt would be beneficial.  There was significant difficulty getting a stent delivered during the last cath.  I think there would be a high likelihood of completely occluding the vessel with wire manipulation. .       Patient Profile     54 y.o. female with PMH of severe multivessel CAD s/p PCI/DES to RCA and OM1 56/43/3295, chronic diastolic CHF, HTN, HLD, DM type 2, who is being followed by cardiology for the evaluation of chest pain.   Assessment & Plan    1.Unstable angin:  CAD s/p recent PCI/DES to RCA and OM1 01/17/2019:  Cath 2/12 with occluded stent to OM Decision made to Rx medically D/C heparin today ASA/Brillinta  Will check troponin in am make sure going down  Cath site is  Sore but good pulse and no large hematoma or firmness Isordil 10 bid started yesterday Suspect most of her self limited Pains are post infarct   2. HTN: Well controlled.  Continue current medications and low sodium Dash type diet.    3. HLD: continue statin   4. Chronic diastolic CHF: euvolemic had PD last night   5. DM type 2: A1C 7.2 12/2018  On insulin    6. ESRD on PD: Nephrology following right radial used for cath due to poor healing RFA previous cath     For questions or updates, please contact [place] Please consult [email or web address] for contact info under Cardiology/STEMI.      Signed, Jenkins Rouge, MD  03/05/2019, 8:45 AM   [phone]

## 2019-03-05 NOTE — Progress Notes (Signed)
Patient c/o CP 9/10, mid chest, non-radiating, BP 150/74, HR 78, skin warm and dry.  Given Morphine 2 mg IVP with relief of CP. Will continue to monitor.

## 2019-03-05 NOTE — Progress Notes (Signed)
Pt reported CP 12/10 during shift report.  EKG obtained with no acute changes.  BP 139/91, HR 80s.  @ SL NTG given per protocol/PRN orders.  PT now reports CP 4/10.  Pt states pain feels better when sitting up.  Dr. Alveta Heimlich notified with new orders to modify PRN morphine to 38m Q 2 hrs PRN.  BP currently 126/83.  Modification made, will continue to monitor patient closely.

## 2019-03-05 NOTE — Progress Notes (Signed)
CRITICAL VALUE ALERT  Critical Value:  Troponin 929  Date & Time Notied:  03/05/19 1310hrs  Provider Notified: Dr. Sallyanne Kuster  Orders Received/Actions taken: no new orders at this time.

## 2019-03-05 NOTE — Progress Notes (Signed)
[place] for Heparin Indication: chest pain/ACS  Allergies  Allergen Reactions  . Amlodipine Swelling  . Atorvastatin     Elevated LFT's  . Clonidine Derivatives Swelling    Limbs swell  . Doxycycline Nausea And Vomiting    "I threw up for 3 hours"  . Welchol [Colesevelam Hcl] Nausea Only    Patient Measurements: Height: 5' 5"  (165.1 cm) Weight: 274 lb 4 oz (124.4 kg) IBW/kg (Calculated) : 57 Heparin Dosing Weight: 87.6kg  Vital Signs: Temp: 97.8 F (36.6 C) (02/14 0855) Temp Source: Oral (02/14 0855) BP: 112/73 (02/14 0855) Pulse Rate: 79 (02/14 0855)  Labs: Recent Labs    03/02/19 1720 03/02/19 1720 03/02/19 1920 03/03/19 0622 03/03/19 1146 03/04/19 0826  HGB 12.0   < >  --  10.9*  --  11.3*  HCT 38.0  --   --  34.0*  --  35.8*  PLT 479*  --   --  412*  --  476*  HEPARINUNFRC  --   --   --  0.55 0.50 0.37  CREATININE 14.41*  --   --  15.56*  --  15.43*  TROPONINIHS 119*  --  132*  --   --   --    < > = values in this interval not displayed.    Estimated Creatinine Clearance: 5.5 mL/min (A) (by C-G formula based on SCr of 15.43 mg/dL (H)).   Medical History: Past Medical History:  Diagnosis Date  . Anemia   . CAD (coronary artery disease)    a. cath in 02/2018 showing moderate 3-vessel CAD with 45% mid-LADm 55% LCx and 65% RCA stenosis which was not significant by FFR  . ESRD on dialysis ([place])   . Gout   . HCAP (healthcare-associated pneumonia) 05/06/2016  . History of blood transfusion    "related to surgery"  . Hyperlipidemia   . Hypertension   . Morbid obesity ([place])   . Pain    LEFT SHOULDER PAIN - WAS SEEN AT AN URGENT CARE - GIVEN SLING FOR COMFORT AND TOLD ROM AS TOLERATED.  Marland Kitchen Palpitations 09/24/2016  . Peritonitis, dialysis-associated ([place])   . Type II diabetes mellitus ([place])    "gastric sleeve OR corrected this" (05/06/2016)     Assessment: Patient is a 40 yom that presents to the ED with chest  pain. The patient has a hx of CAD, ESRD, HTN, DM, and anemia. Patients Trop has been trending up at this time. Pharmacy has been asked to dose heparin for ACS.  She is now s/p cath 2/12 with stent thrombosis of the OM stent for medical treatment. Plans noted for heparin for 24 hours -heparin level at goal   Goal of Therapy:  Heparin level 0.3-0.7 units/ml Monitor platelets by anticoagulation protocol: Yes   Plan:  -No heparin changes needed; anticipate discontinuing today   Hildred Laser, PharmD Clinical Pharmacist **Pharmacist phone directory can now be found on [email or web address] ([email or web address]).  Listed under [place].

## 2019-03-06 ENCOUNTER — Telehealth (HOSPITAL_COMMUNITY): Payer: Self-pay

## 2019-03-06 DIAGNOSIS — R778 Other specified abnormalities of plasma proteins: Secondary | ICD-10-CM

## 2019-03-06 DIAGNOSIS — I953 Hypotension of hemodialysis: Secondary | ICD-10-CM

## 2019-03-06 LAB — CBC
HCT: 36.7 % (ref 36.0–46.0)
Hemoglobin: 11.5 g/dL — ABNORMAL LOW (ref 12.0–15.0)
MCH: 31.8 pg (ref 26.0–34.0)
MCHC: 31.3 g/dL (ref 30.0–36.0)
MCV: 101.4 fL — ABNORMAL HIGH (ref 80.0–100.0)
Platelets: 411 10*3/uL — ABNORMAL HIGH (ref 150–400)
RBC: 3.62 MIL/uL — ABNORMAL LOW (ref 3.87–5.11)
RDW: 16.5 % — ABNORMAL HIGH (ref 11.5–15.5)
WBC: 7.9 10*3/uL (ref 4.0–10.5)
nRBC: 0.9 % — ABNORMAL HIGH (ref 0.0–0.2)

## 2019-03-06 LAB — GLUCOSE, CAPILLARY
Glucose-Capillary: 110 mg/dL — ABNORMAL HIGH (ref 70–99)
Glucose-Capillary: 120 mg/dL — ABNORMAL HIGH (ref 70–99)
Glucose-Capillary: 92 mg/dL (ref 70–99)
Glucose-Capillary: 99 mg/dL (ref 70–99)

## 2019-03-06 LAB — TROPONIN I (HIGH SENSITIVITY): Troponin I (High Sensitivity): 2454 ng/L (ref <18)

## 2019-03-06 MED ORDER — HEPARIN SODIUM (PORCINE) 5000 UNIT/ML IJ SOLN
5000.0000 [IU] | Freq: Three times a day (TID) | INTRAMUSCULAR | Status: DC
  Administered 2019-03-06 – 2019-03-14 (×24): 5000 [IU] via SUBCUTANEOUS
  Filled 2019-03-06 (×24): qty 1

## 2019-03-06 MED ORDER — PRO-STAT SUGAR FREE PO LIQD
30.0000 mL | Freq: Two times a day (BID) | ORAL | Status: DC
  Administered 2019-03-06 – 2019-03-12 (×7): 30 mL via ORAL
  Filled 2019-03-06 (×10): qty 30

## 2019-03-06 MED ORDER — MIDODRINE HCL 5 MG PO TABS
5.0000 mg | ORAL_TABLET | Freq: Two times a day (BID) | ORAL | Status: DC
  Administered 2019-03-06 – 2019-03-07 (×3): 5 mg via ORAL
  Filled 2019-03-06 (×3): qty 1

## 2019-03-06 NOTE — Progress Notes (Signed)
[facility] Progress Note   Subjective:  Seen in room - PD just finished, will inform dialysis nurse to disconnect. BP low side this morning - hasn't eaten yet. Tells me that cardiology wants to walk her to make sure not symptomatic orthostatic hypotension, then possibly can d/c home this afternoon if tolerates. No CP/dyspnea for now.  Objective Vitals:   03/06/19 0524 03/06/19 0747 03/06/19 0758 03/06/19 0919  BP: 99/72 (!) 83/53 (!) 90/57 (!) 84/50  Pulse: 82 81 85 75  Resp:  16 18 12   Temp: 98.2 F (36.8 C)     TempSrc: Oral     SpO2: 100% 96% 99% 96%  Weight:      Height:       Physical Exam General: Well appearing woman, NAD Heart: RRR; no murmur Lungs: CTAB Abdomen: soft, non-tender. PD cath in place Extremities: No LE edema; very dry skin Dialysis Access:  PD cath  Additional Objective Labs: Basic Metabolic Panel: Recent Labs  Lab 03/02/19 1720 03/03/19 0057 03/03/19 0622 03/04/19 0826  NA 139  --  141 138  K 3.3*  --  3.2* 3.7  CL 97*  --  99 97*  CO2 27  --  26 23  GLUCOSE 155*  --  98 108*  BUN 46*  --  49* 49*  CREATININE 14.41*  --  15.56* 15.43*  CALCIUM 8.7*  --  8.2* 7.8*  PHOS  --  6.2*  --   --    CBC: Recent Labs  Lab 03/02/19 1720 03/02/19 1720 03/03/19 0622 03/03/19 0622 03/04/19 0826 03/05/19 1134 03/06/19 0358  WBC 9.8   < > 9.6   < > 9.5 9.9 7.9  NEUTROABS 7.0  --   --   --   --   --   --   HGB 12.0   < > 10.9*   < > 11.3* 11.1* 11.5*  HCT 38.0   < > 34.0*   < > 35.8* 34.9* 36.7  MCV 100.5*  --  98.6  --  98.9 99.4 101.4*  PLT 479*   < > 412*   < > 476* 430* 411*   < > = values in this interval not displayed.   CBG: Recent Labs  Lab 03/05/19 1158 03/05/19 1638 03/05/19 1721 03/05/19 2034 03/06/19 0730  GLUCAP 160* 85 90 106* 110*   Medications: . sodium chloride 10 mL/hr at 03/03/19 0800   . aspirin EC  81 mg Oral Daily  . calcium acetate  667 mg Oral TID WC  . cinacalcet  90 mg Oral QHS  . ferric  citrate  420 mg Oral TID WC  . folic acid  1 mg Oral Daily  . gabapentin  100 mg Oral BID  . gentamicin cream  1 application Topical Daily  . hydrocortisone   Topical QID  . insulin aspart  0-6 Units Subcutaneous TID WC  . isosorbide dinitrate  10 mg Oral BID  . pantoprazole  40 mg Oral BID AC  . rosuvastatin  10 mg Oral Daily  . ticagrelor  90 mg Oral BID  . vitamin B-12  1,000 mcg Oral Daily    Dialysis Orders: CCPD 6 exchanges 2.7Lfill.1.5h dwell No day dwell.(Patient says she has been doing 5 exchanges)  Assessment/Plan: 1. CAD/Unstable angina. Known multivessel disease. LHC 2/12 with progressive disease, occlusion of recent OM stent. Cardiology following - medical therapy for now - started on isosorbide. On aspirin/Brilinta. Hypotensive this AM - see below. 2. ESRD:  Continue CCPD - used 2.5% fluid last night, BP low today. Discussed see how does after eating. Disc likely needs 1.5% tonight given low BP whether here or at home. 3. Hypokalemia - Now replete. Monitor.  4. Hypertension/volume - BP low now - just started on low dose isosorbide which has helped CP. Eat breakfast - follow. Will plan on 1.5% fluids tonight. 5. Anemia - Hgb 11. No ESA needs. Follow trends. 6. Metabolic bone disease: Ca ok, last Phos high. Continue home binders/sensipar for now. 7. Nutrition: Adding pro-stat supplements while here. 8. GERD: on PPI  Veneta Penton, PA-C 03/06/2019, 9:25 AM  [facility] Pager: [phone]

## 2019-03-06 NOTE — Telephone Encounter (Signed)
Pt insurance is active and benefits verified through [employer] Co-pay 0, DED $750/$750 met, out of pocket $3,750/$1,203.41 met, co-insurance 10%. no pre-authorization required, REF# [id number]  Will contact patient to see if she is interested in the Cardiac Rehab Program. If interested, patient will need to complete follow up appt. Once completed, patient will be contacted for scheduling upon review by the RN Navigator.

## 2019-03-06 NOTE — Progress Notes (Signed)
PROGRESS NOTE    Brenda Davis  TDS:[id number] DOB: Jun 22, 1965 DOA: 03/02/2019 PCP: [name], MD   Brief Narrative:  Patient is a 29 old female with history of end-stage renal disease on peritoneal dialysis, coronary artery disease status post multiple PCIs, chronic diastolic heart failure, diabetes, hyperlipidemia who presents to the emergency department on February 11 with complaints of chest pain.  Work-up revealed elevated troponin concerning for NSTEMI.  Cardiology consulted.  She underwent cardiac cath with plans for medical management.  Nephrology and cardiology are following.   Assessment & Plan:   Principal Problem:   Coronary artery disease involving native coronary artery of native heart with unstable angina pectoris ([place]) Active Problems:   Morbid obesity with BMI of 40.0-44.9, adult (HCC)   Anemia of chronic disease   Chronic heart failure with preserved ejection fraction (HCC)   ESRD on peritoneal dialysis ([place])   DM (diabetes mellitus), type 2 with renal complications (HCC)   NSTEMI (non-ST elevated myocardial infarction) (HCC)   Elevated troponin   Chest pain   Unstable angina (HCC)   Unstable angina: History of coronary artery disease.  Presented with chest pain.  Had previous PCI with DES on 01/17/2019.  Cardiology was consulted.  Underwent cardiac cath on 2/12 with finding of progessive disease, stent thrombosis of OM.  PCI not done this time.  Plan for medical management.  Continue statin, aspirin, Brilinta. Heparin drip discontinued.  Continue nitrates as needed for chest pain. She denies any chest pain today.  Her troponin trended up significantly.  Cardiology closely following.  End-stage renal disease on dialysis: Continue peritoneal dialysis.  Creatinine of 15.  Nephrology following.  Right forearm swelling/pain: Most likely associated with cath procedure.  Venous Doppler negative for DVT.  Pain is improved today.  Hypertension:  Currently blood pressure soft.  She was unable to tolerate beta-blocker due to hypotension in the past.  Continue to monitor.  Started on midodrine.  Chronic diastolic CHF: Currently compensated.  Volume is managed with peritoneal dialysis.  Diabetes type 2: Hemoglobin A1c 7.2 as per last month.  She takes Metformin at home.  Continue sliding scale insulin.  Morbid obesity: BMI of 45.86.  GERD: Continue PPI  Debility/deconditioning: We have requested physical therapy evaluation.  She lives with her husband and ambulates without problem at home.         DVT prophylaxis:heparin [place] Code Status: Full Family Communication: None present at the bedside Disposition Plan: Patient is from home.  Waiting for PT/OT evaluation.  She is not stable for discharge today because of hypertension, troponin trending up.  Might be discharged tomorrow to home if cleared by cardiology.   Consultants: Cardiology, nephrology  Procedures: Cath  Antimicrobials:  Anti-infectives (From admission, onward)   None      Subjective:  Patient seen and examined the bedside this morning.  Currently blood pressure is soft but overall  hemodynamically stable.  Chest pain was not present today.  Denies any shortness of breath.  Right arm pain is improving.  Has generalized weakness.   Objective: Vitals:   03/05/19 2100 03/06/19 0524 03/06/19 0747 03/06/19 0758  BP:  99/72 (!) 83/53 (!) 90/57  Pulse:  82 81 85  Resp:   16 18  Temp: 97.8 F (36.6 C) 98.2 F (36.8 C)    TempSrc: Oral Oral    SpO2:  100% 96% 99%  Weight: 125 kg     Height:        Intake/Output Summary (  Last 24 hours) at 03/06/2019 0819 Last data filed at 03/05/2019 1800 Gross per 24 hour  Intake 14297.97 ml  Output 1440 ml  Net 12857.97 ml   Filed Weights   03/03/19 0331 03/05/19 0855 03/05/19 2100  Weight: 124.7 kg 124.4 kg 125 kg    Examination:  General exam: Generalized weakness, morbidly obese HEENT:PERRL,Oral mucosa moist,  Ear/Nose normal on gross exam Respiratory system: Bilateral equal air entry, normal vesicular breath sounds, no wheezes or crackles  Cardiovascular system: S1 & S2 heard, RRR. No JVD, murmurs, rubs, gallops or clicks. No pedal edema. Gastrointestinal system: Abdomen is nondistended, soft and nontender. No organomegaly or masses felt. Normal bowel sounds heard.  Peritoneal dialysis catheter Central nervous system: Alert and oriented. No focal neurological deficits. Extremities: No edema, no clubbing ,no cyanosis Skin: No rashes, lesions or ulcers,no icterus ,no pallor    Data Reviewed: I have personally reviewed following labs and imaging studies  CBC: Recent Labs  Lab 03/02/19 1720 03/03/19 0622 03/04/19 0826 03/05/19 1134 03/06/19 0358  WBC 9.8 9.6 9.5 9.9 7.9  NEUTROABS 7.0  --   --   --   --   HGB 12.0 10.9* 11.3* 11.1* 11.5*  HCT 38.0 34.0* 35.8* 34.9* 36.7  MCV 100.5* 98.6 98.9 99.4 101.4*  PLT 479* 412* 476* 430* 109*   Basic Metabolic Panel: Recent Labs  Lab 03/02/19 1720 03/03/19 0057 03/03/19 0622 03/04/19 0826  NA 139  --  141 138  K 3.3*  --  3.2* 3.7  CL 97*  --  99 97*  CO2 27  --  26 23  GLUCOSE 155*  --  98 108*  BUN 46*  --  49* 49*  CREATININE 14.41*  --  15.56* 15.43*  CALCIUM 8.7*  --  8.2* 7.8*  MG  --  1.8  --   --   PHOS  --  6.2*  --   --    GFR: Estimated Creatinine Clearance: 5.5 mL/min (A) (by C-G formula based on SCr of 15.43 mg/dL (H)). Liver Function Tests: No results for input(s): AST, ALT, ALKPHOS, BILITOT, PROT, ALBUMIN in the last 168 hours. No results for input(s): LIPASE, AMYLASE in the last 168 hours. No results for input(s): AMMONIA in the last 168 hours. Coagulation Profile: No results for input(s): INR, PROTIME in the last 168 hours. Cardiac Enzymes: No results for input(s): CKTOTAL, CKMB, CKMBINDEX, TROPONINI in the last 168 hours. BNP (last 3 results) No results for input(s): PROBNP in the last 8760 hours. HbA1C: No  results for input(s): HGBA1C in the last 72 hours. CBG: Recent Labs  Lab 03/05/19 1158 03/05/19 1638 03/05/19 1721 03/05/19 2034 03/06/19 0730  GLUCAP 160* 85 90 106* 110*   Lipid Profile: No results for input(s): CHOL, HDL, LDLCALC, TRIG, CHOLHDL, LDLDIRECT in the last 72 hours. Thyroid Function Tests: No results for input(s): TSH, T4TOTAL, FREET4, T3FREE, THYROIDAB in the last 72 hours. Anemia Panel: No results for input(s): VITAMINB12, FOLATE, FERRITIN, TIBC, IRON, RETICCTPCT in the last 72 hours. Sepsis Labs: No results for input(s): PROCALCITON, LATICACIDVEN in the last 168 hours.  Recent Results (from the past 240 hour(s))  SARS CORONAVIRUS 2 (TAT 6-24 HRS) Nasopharyngeal Nasopharyngeal Swab     Status: None   Collection Time: 03/02/19 11:47 PM   Specimen: Nasopharyngeal Swab  Result Value Ref Range Status   SARS Coronavirus 2 NEGATIVE NEGATIVE Final    Comment: (NOTE) SARS-CoV-2 target nucleic acids are NOT DETECTED. The SARS-CoV-2 RNA is generally detectable  in upper and lower respiratory specimens during the acute phase of infection. Negative results do not preclude SARS-CoV-2 infection, do not rule out co-infections with other pathogens, and should not be used as the sole basis for treatment or other patient management decisions. Negative results must be combined with clinical observations, patient history, and epidemiological information. The expected result is Negative. Fact Sheet for Patients: SugarRoll.be Fact Sheet for Healthcare Providers: https://www.woods-mathews.com/ This test is not yet approved or cleared by the [place] FDA and  has been authorized for detection and/or diagnosis of SARS-CoV-2 by FDA under an Emergency Use Authorization (EUA). This EUA will remain  in effect (meaning this test can be used) for the duration of the COVID-19 declaration under Section 56 4(b)(1) of the Act, 21 U.S.C. section  360bbb-3(b)(1), unless the authorization is terminated or revoked sooner. Performed at [facility], [place] [address] [place], [place] [address]          Radiology Studies: No results found.      Scheduled Meds: . aspirin EC  81 mg Oral Daily  . calcium acetate  667 mg Oral TID WC  . cinacalcet  90 mg Oral QHS  . ferric citrate  420 mg Oral TID WC  . folic acid  1 mg Oral Daily  . gabapentin  100 mg Oral BID  . gentamicin cream  1 application Topical Daily  . hydrocortisone   Topical QID  . insulin aspart  0-6 Units Subcutaneous TID WC  . isosorbide dinitrate  10 mg Oral BID  . pantoprazole  40 mg Oral BID AC  . rosuvastatin  10 mg Oral Daily  . ticagrelor  90 mg Oral BID  . vitamin B-12  1,000 mcg Oral Daily   Continuous Infusions: . sodium chloride 10 mL/hr at 03/03/19 0800     LOS: 3 days    Time spent: 35 mins.More than 50% of that time was spent in counseling and/or coordination of care.      Shelly Coss, MD [facility] P2/15/2021, 8:19 AM

## 2019-03-06 NOTE — Progress Notes (Signed)
Dr. Hollie Salk telephoned and notified of pt. bp 85/46 prior to PD initiation pt. previously received midodrine. Orders per Dr. Hollie Salk decrease fill volume to 2.5L. pt stable asymptomatic

## 2019-03-06 NOTE — Plan of Care (Signed)
  Problem: Activity: Goal: Risk for activity intolerance will decrease Outcome: Progressing   Problem: Coping: Goal: Level of anxiety will decrease Outcome: Progressing   

## 2019-03-06 NOTE — Progress Notes (Addendum)
Progress Note  Patient Name: Brenda Davis Date of Encounter: 03/06/2019  Primary Cardiologist: Skeet Latch, MD   Subjective   "for the first time during this admission, I did not have any chest pain last night". No SOB. Mild dizziness with low BP after PD  Inpatient Medications    Scheduled Meds: . aspirin EC  81 mg Oral Daily  . calcium acetate  667 mg Oral TID WC  . cinacalcet  90 mg Oral QHS  . ferric citrate  420 mg Oral TID WC  . folic acid  1 mg Oral Daily  . gabapentin  100 mg Oral BID  . gentamicin cream  1 application Topical Daily  . hydrocortisone   Topical QID  . insulin aspart  0-6 Units Subcutaneous TID WC  . isosorbide dinitrate  10 mg Oral BID  . pantoprazole  40 mg Oral BID AC  . rosuvastatin  10 mg Oral Daily  . ticagrelor  90 mg Oral BID  . vitamin B-12  1,000 mcg Oral Daily   Continuous Infusions: . sodium chloride 10 mL/hr at 03/03/19 0800   PRN Meds: acetaminophen, diclofenac Sodium, gentamicin cream, dianeal solution for CAPD/CCPD with heparin, dianeal solution for CAPD/CCPD with heparin, HYDROcodone-acetaminophen, morphine injection, nitroGLYCERIN, ondansetron (ZOFRAN) IV   Vital Signs    Vitals:   03/05/19 2034 03/05/19 2100 03/06/19 0524 03/06/19 0747  BP:   99/72 (!) 83/53  Pulse:   82 81  Resp:    16  Temp: 97.8 F (36.6 C) 97.8 F (36.6 C) 98.2 F (36.8 C)   TempSrc: Oral Oral Oral   SpO2:   100% 96%  Weight:  125 kg    Height:        Intake/Output Summary (Last 24 hours) at 03/06/2019 0757 Last data filed at 03/05/2019 1800 Gross per 24 hour  Intake 14297.97 ml  Output 1440 ml  Net 12857.97 ml   Last 3 Weights 03/05/2019 03/05/2019 03/05/2019  Weight (lbs) 275 lb 9.2 oz 274 lb 4 oz (No Data)  Weight (kg) 125 kg 124.4 kg (No Data)      Telemetry    NSR without significant ventricular ectopy - Personally Reviewed  ECG    NSR without significant ST-T wave changes - Personally Reviewed  Physical Exam    GEN: No acute distress.   Neck: No JVD Cardiac: RRR, no murmurs, rubs, or gallops.  Respiratory: Clear to auscultation bilaterally. GI: Soft, nontender, non-distended  MS: No edema; No deformity. Neuro:  Nonfocal  Psych: Normal affect   Labs    High Sensitivity Troponin:   Recent Labs  Lab 02/11/19 2122 02/11/19 2318 03/02/19 1720 03/02/19 1920 03/05/19 1134  TROPONINIHS 11 11 119* 132* 929*      Chemistry Recent Labs  Lab 03/02/19 1720 03/03/19 0622 03/04/19 0826  NA 139 141 138  K 3.3* 3.2* 3.7  CL 97* 99 97*  CO2 27 26 23   GLUCOSE 155* 98 108*  BUN 46* 49* 49*  CREATININE 14.41* 15.56* 15.43*  CALCIUM 8.7* 8.2* 7.8*  GFRNONAA 3* 2* 2*  GFRAA 3* 3* 3*  ANIONGAP 15 16* 18*     Hematology Recent Labs  Lab 03/04/19 0826 03/05/19 1134 03/06/19 0358  WBC 9.5 9.9 7.9  RBC 3.62* 3.51* 3.62*  HGB 11.3* 11.1* 11.5*  HCT 35.8* 34.9* 36.7  MCV 98.9 99.4 101.4*  MCH 31.2 31.6 31.8  MCHC 31.6 31.8 31.3  RDW 16.0* 16.3* 16.5*  PLT 476* 430* 411*  BNPNo results for input(s): BNP, PROBNP in the last 168 hours.   DDimer No results for input(s): DDIMER in the last 168 hours.   Radiology    No results found.  Cardiac Studies   Cath 03/03/2019  Mid LAD lesion is 75% stenosed.  Ost LAD to Mid LAD lesion is 40% stenosed.  Previously placed Ost 1st Mrg drug eluting stent is widely patent.  Mid Cx to Dist Cx lesion is 55% stenosed with 40% stenosed side branch in Ost 3rd Mrg.  Previously placed Prox RCA drug eluting stent is widely patent.  Ost 1st Mrg to 1st Mrg lesion is 99% stenosed. Partially in the stent and partially distal to the stent- subacte stent thrombosis.  LV end diastolic pressure is normal.  There is no aortic valve stenosis.   Medical therapy for stent thrombosis of the OM stent.  Given the severe tortuosity, I don't think repeat PCI attempt would be beneficial.  There was significant difficulty getting a stent delivered during the  last cath.  I think there would be a high likelihood of completely occluding the vessel with wire manipulation. .   Patient Profile     54 y.o. female with PMH of ESRD on PD, CAD s/p recent PCI, HTN, HLD, DM II and cirrhosis presented with chest pain.   Assessment & Plan    1. NSTEMI: Cath 2/12 showed occluded stent to OM, decision made to medically manage.   - troponin 119 --> 132 --> 929. Likely represent post infarct  - on aspirin, Brilinta, crestor and Isordil.  - patient was concerned of previous intolerance to Imdur (which according to her causes chest pain instead of alleviating it). She seems to be tolerating Isosorbide dinitrate.   - recheck single troponin today, make sure troponin is trending down  - recommend ambulate later today once BP improve, if able to ambulate without chest pain, likely D/C this afternoon  2. CAD: s/p recent PCI DES to RCA and OM1 on 01/17/2019  3. HTN: hypotensive this morning after PD last night. Need to hold isordil in AM of dialysis  4. HLD: on crestor  5. DM II  6. Chronic diastolic CHF: euvolemic after PD  7. ESRD on PD        For questions or updates, please contact [place] Please consult [email or web address] for contact info under        Signed, Almyra Deforest, Luis M. Cintron  03/06/2019, 7:58 AM

## 2019-03-06 NOTE — Evaluation (Signed)
Physical Therapy Evaluation Patient Details Name: Brenda Davis MRN: [id number] DOB: 1965/12/20 Today's Date: 03/06/2019   History of Present Illness  Patient is a 81 old female with history of end-stage renal disease on peritoneal dialysis, coronary artery disease status post multiple PCIs, chronic diastolic heart failure, diabetes, hyperlipidemia who presents to the emergency department on February 11 with complaints of chest pain.  Work-up revealed elevated troponin concerning for NSTEMI.  Cardiology consulted.  She underwent cardiac cath with plans for medical management.  Clinical Impression  Patient presents with mobility close to baseline, but some decreased balance and decreased activity tolerance recently with low BP's (though she states deals with this at dialysis anyway.)  BP measurements were stable during mobility and pt tolerated well with HR in 110's and SpO2 98-100% on RA.  Mobilized with S to minguard A with RW.  Spouse able to assist at d/c. Feel stable for home when medically ready.  If remains inpatient will check back during acute stay, but no follow up PT recommended at this time.    Orthostatic VS for the past 24 hrs (Last 3 readings):  BP- Sitting Pulse- Sitting BP- Standing at 0 minutes Pulse- Standing at 0 minutes  03/06/19 1500 111/73 107 107/54 112    Follow Up Recommendations No PT follow up    Equipment Recommendations  None recommended by PT    Recommendations for Other Services       Precautions / Restrictions Precautions Precautions: Other (comment);Fall Precaution Comments: watch BP      Mobility  Bed Mobility               General bed mobility comments: sitting up on EOB  Transfers Overall transfer level: Needs assistance Equipment used: Rolling walker (2 wheeled);None Transfers: Sit to/from Stand Sit to Stand: Min guard;Supervision         General transfer comment: initially assist for balance without RW, then with RW  able to stand with S  Ambulation/Gait Ambulation/Gait assistance: Supervision;Min guard Gait Distance (Feet): 160 Feet Assistive device: Rolling walker (2 wheeled) Gait Pattern/deviations: Step-through pattern;Decreased stride length     General Gait Details: one episode of L knee buckling when taking hand off walker to adjust mask; assist for safety, balance, HR 114 with ambulation  Stairs            Wheelchair Mobility    Modified Rankin (Stroke Patients Only)       Balance Overall balance assessment: Needs assistance   Sitting balance-Leahy Scale: Good     Standing balance support: Single extremity supported Standing balance-Leahy Scale: Poor Standing balance comment: standing for BP measurement no RW needed to hold b/s table for balance                             Pertinent Vitals/Pain Pain Assessment: No/denies pain    Home Living Family/patient expects to be discharged to:: Private residence Living Arrangements: Spouse/significant other Available Help at Discharge: Family;Available PRN/intermittently Type of Home: House Home Access: Stairs to enter Entrance Stairs-Rails: None Entrance Stairs-Number of Steps: 2 small steps Home Layout: One level Home Equipment: Walker - 2 wheels;Bedside commode;Cane - single point;Shower seat      Prior Function Level of Independence: Independent         Comments: currently on ST disability, plans to return to work     Hand Dominance        Extremity/Trunk Assessment   Upper Extremity  Assessment Upper Extremity Assessment: Overall WFL for tasks assessed    Lower Extremity Assessment Lower Extremity Assessment: Generalized weakness(gets a catch in L hip at times)       Communication   Communication: No difficulties  Cognition Arousal/Alertness: Awake/alert Behavior During Therapy: WFL for tasks assessed/performed Overall Cognitive Status: Within Functional Limits for tasks assessed                                         General Comments General comments (skin integrity, edema, etc.): spouse present, reviewed safety precautions for fall prevention    Exercises     Assessment/Plan    PT Assessment    PT Problem List         PT Treatment Interventions      PT Goals (Current goals can be found in the Care Plan section)  Acute Rehab PT Goals Patient Stated Goal: to go home PT Goal Formulation: With patient/family Time For Goal Achievement: 03/13/19 Potential to Achieve Goals: Good    Frequency     Barriers to discharge        Co-evaluation               AM-PAC PT "6 Clicks" Mobility  Outcome Measure Help needed turning from your back to your side while in a flat bed without using bedrails?: None Help needed moving from lying on your back to sitting on the side of a flat bed without using bedrails?: None Help needed moving to and from a bed to a chair (including a wheelchair)?: A Little Help needed standing up from a chair using your arms (e.g., wheelchair or bedside chair)?: A Little Help needed to walk in hospital room?: A Little Help needed climbing 3-5 steps with a railing? : A Little 6 Click Score: 20    End of Session     Patient left: with call bell/phone within reach;in bed;with family/visitor present   PT Visit Diagnosis: Difficulty in walking, not elsewhere classified (R26.2)    Time: 4097-3532 PT Time Calculation (min) (ACUTE ONLY): 26 min   Charges:   PT Evaluation $PT Eval Low Complexity: 1 Low PT Treatments $Gait Training: 8-22 mins        Magda Kiel, [place] [phone] 03/06/2019   [name] 03/06/2019, 3:39 PM

## 2019-03-06 NOTE — Progress Notes (Signed)
Discussed with Dr. Oval Linsey regarding continued uptrending of her troponin. Given hypotension, recommend keep overnight. Will inform hospitalist service.

## 2019-03-06 NOTE — Progress Notes (Signed)
PT Cancellation Note  Patient Details Name: Brenda Davis MRN: [id number] DOB: [birth year]   Cancelled Treatment:    Reason Eval/Treat Not Completed: Medical issues which prohibited therapy; attempted PT this morning, she had low BP and still hooked to PD.  Will attempt again later today, RN to call when BP improved.    [name] 03/06/2019, 10:47 AM  Magda Kiel, [place] [phone] 03/06/2019

## 2019-03-06 NOTE — Progress Notes (Signed)
CRITICAL VALUE ALERT  Critical Value:  Troponin 2.,454  Date & Time Notied:  03/06/19 1140hrs  Provider Notified: Eulas Post PA  Orders Received/Actions taken: no new orders, will continue to monitor.

## 2019-03-07 DIAGNOSIS — D638 Anemia in other chronic diseases classified elsewhere: Secondary | ICD-10-CM

## 2019-03-07 DIAGNOSIS — I200 Unstable angina: Secondary | ICD-10-CM

## 2019-03-07 LAB — BASIC METABOLIC PANEL
Anion gap: 17 — ABNORMAL HIGH (ref 5–15)
BUN: 45 mg/dL — ABNORMAL HIGH (ref 6–20)
CO2: 23 mmol/L (ref 22–32)
Calcium: 6.8 mg/dL — ABNORMAL LOW (ref 8.9–10.3)
Chloride: 92 mmol/L — ABNORMAL LOW (ref 98–111)
Creatinine, Ser: 15.44 mg/dL — ABNORMAL HIGH (ref 0.44–1.00)
GFR calc Af Amer: 3 mL/min — ABNORMAL LOW (ref >60)
GFR calc non Af Amer: 2 mL/min — ABNORMAL LOW (ref >60)
Glucose, Bld: 88 mg/dL (ref 70–99)
Potassium: 3.4 mmol/L — ABNORMAL LOW (ref 3.5–5.1)
Sodium: 132 mmol/L — ABNORMAL LOW (ref 135–145)

## 2019-03-07 LAB — GLUCOSE, CAPILLARY
Glucose-Capillary: 85 mg/dL (ref 70–99)
Glucose-Capillary: 78 mg/dL (ref 70–99)
Glucose-Capillary: 91 mg/dL (ref 70–99)
Glucose-Capillary: 91 mg/dL (ref 70–99)
Glucose-Capillary: 97 mg/dL (ref 70–99)

## 2019-03-07 LAB — TROPONIN I (HIGH SENSITIVITY): Troponin I (High Sensitivity): 2970 ng/L (ref <18)

## 2019-03-07 MED ORDER — CALCIUM ACETATE (PHOS BINDER) 667 MG PO CAPS
1334.0000 mg | ORAL_CAPSULE | Freq: Three times a day (TID) | ORAL | Status: DC
  Administered 2019-03-10 – 2019-03-14 (×9): 1334 mg via ORAL
  Filled 2019-03-07 (×13): qty 2

## 2019-03-07 MED ORDER — POTASSIUM CHLORIDE CRYS ER 20 MEQ PO TBCR
40.0000 meq | EXTENDED_RELEASE_TABLET | Freq: Once | ORAL | Status: AC
  Administered 2019-03-07: 40 meq via ORAL

## 2019-03-07 MED ORDER — MIDODRINE HCL 5 MG PO TABS
10.0000 mg | ORAL_TABLET | Freq: Three times a day (TID) | ORAL | Status: DC
  Administered 2019-03-07 – 2019-03-10 (×9): 10 mg via ORAL
  Filled 2019-03-07 (×9): qty 2

## 2019-03-07 NOTE — Progress Notes (Signed)
Progress Note  Patient Name: Brenda Davis Date of Encounter: 03/07/2019  Primary Cardiologist: Skeet Latch, MD   Subjective   The patient says she had some mild brief intermittent chest discomfort overnight which was manageable.  Her breathing has been unremarkable.  She did have an episode of central sharp chest pain this morning that lasted for about 15 minutes and resolved spontaneously.  She was unable to take Isordil this morning due to low blood pressure.  She did complete peritoneal dialysis overnight.  Inpatient Medications    Scheduled Meds: . aspirin EC  81 mg Oral Daily  . calcium acetate  667 mg Oral TID WC  . cinacalcet  90 mg Oral QHS  . feeding supplement (PRO-STAT SUGAR FREE 64)  30 mL Oral BID  . ferric citrate  420 mg Oral TID WC  . folic acid  1 mg Oral Daily  . gabapentin  100 mg Oral BID  . gentamicin cream  1 application Topical Daily  . heparin injection (subcutaneous)  5,000 Units Subcutaneous Q8H  . hydrocortisone   Topical QID  . insulin aspart  0-6 Units Subcutaneous TID WC  . isosorbide dinitrate  10 mg Oral BID  . midodrine  5 mg Oral BID WC  . pantoprazole  40 mg Oral BID AC  . rosuvastatin  10 mg Oral Daily  . ticagrelor  90 mg Oral BID  . vitamin B-12  1,000 mcg Oral Daily   Continuous Infusions: . sodium chloride 10 mL/hr at 03/03/19 0800   PRN Meds: acetaminophen, diclofenac Sodium, gentamicin cream, dianeal solution for CAPD/CCPD with heparin, dianeal solution for CAPD/CCPD with heparin, HYDROcodone-acetaminophen, morphine injection, nitroGLYCERIN, ondansetron (ZOFRAN) IV   Vital Signs    Vitals:   03/07/19 0347 03/07/19 0500 03/07/19 0538 03/07/19 0728  BP:   (!) 89/52 (!) 91/55  Pulse: 95  95   Resp:   20   Temp:   99.3 F (37.4 C) 99 F (37.2 C)  TempSrc:   Oral Oral  SpO2: 98%  97%   Weight:  126.1 kg  126.6 kg  Height:        Intake/Output Summary (Last 24 hours) at 03/07/2019 0916 Last data filed at  03/07/2019 0730 Gross per 24 hour  Intake 27850 ml  Output 1795 ml  Net 26055 ml   Last 3 Weights 03/07/2019 03/07/2019 03/05/2019  Weight (lbs) 279 lb 1.6 oz 278 lb 1.6 oz 275 lb 9.2 oz  Weight (kg) 126.6 kg 126.145 kg 125 kg      Telemetry    Sinus rhythm with rates 70s-80s- Personally Reviewed  ECG    No new tracings for review  Physical Exam   GEN: Obese female, No acute distress.   Neck: No JVD Cardiac: RRR, no murmurs, rubs, or gallops.  Respiratory: Clear to auscultation bilaterally. GI: Soft, nontender, non-distended  MS: No edema; No deformity. Neuro:  Nonfocal  Psych: Normal affect   Labs    High Sensitivity Troponin:   Recent Labs  Lab 03/02/19 1720 03/02/19 1920 03/05/19 1134 03/06/19 1011 03/07/19 0427  TROPONINIHS 119* 132* 929* 2,454* 2,970*      Chemistry Recent Labs  Lab 03/03/19 0622 03/04/19 0826 03/07/19 0427  NA 141 138 132*  K 3.2* 3.7 3.4*  CL 99 97* 92*  CO2 26 23 23   GLUCOSE 98 108* 88  BUN 49* 49* 45*  CREATININE 15.56* 15.43* 15.44*  CALCIUM 8.2* 7.8* 6.8*  GFRNONAA 2* 2* 2*  GFRAA 3*  3* 3*  ANIONGAP 16* 18* 17*     Hematology Recent Labs  Lab 03/04/19 0826 03/05/19 1134 03/06/19 0358  WBC 9.5 9.9 7.9  RBC 3.62* 3.51* 3.62*  HGB 11.3* 11.1* 11.5*  HCT 35.8* 34.9* 36.7  MCV 98.9 99.4 101.4*  MCH 31.2 31.6 31.8  MCHC 31.6 31.8 31.3  RDW 16.0* 16.3* 16.5*  PLT 476* 430* 411*    BNPNo results for input(s): BNP, PROBNP in the last 168 hours.   DDimer No results for input(s): DDIMER in the last 168 hours.   Radiology    No results found.  Cardiac Studies   LEFT HEART CATH AND CORONARY ANGIOGRAPHY  03/03/2019  Conclusion   Mid LAD lesion is 75% stenosed.  Ost LAD to Mid LAD lesion is 40% stenosed.  Previously placed Ost 1st Mrg drug eluting stent is widely patent.  Mid Cx to Dist Cx lesion is 55% stenosed with 40% stenosed side branch in Ost 3rd Mrg.  Previously placed Prox RCA drug eluting stent is  widely patent.  Ost 1st Mrg to 1st Mrg lesion is 99% stenosed. Partially in the stent and partially distal to the stent- subacte stent thrombosis.  LV end diastolic pressure is normal.  There is no aortic valve stenosis.   Medical therapy for stent thrombosis of the OM stent.  Given the severe tortuosity, I don't think repeat PCI attempt would be beneficial.  There was significant difficulty getting a stent delivered during the last cath.  I think there would be a high likelihood of completely occluding the vessel with wire manipulation. .     Patient Profile     54 y.o. female with PMH of ESRD on PD, CAD s/p recent PCI, HTN, HLD, DM II and cirrhosis presented with chest pain.   Assessment & Plan    NSTEMI -LHC on 03/03/2019 showed occluded stent to OM and severe tortuosity making repeat PCI too difficult and would not be beneficial.  Treating medically with aspirin, Brilinta, statin and started on Isordil. -High-sensitivity troponins have risen to 2970 as of this morning. -Patient continues to have intermittent chest discomfort however her low blood pressures are limiting our treatment with nitrates.  The patient reported that the Isordil helped her chest pain yesterday however she was lightheaded and had blood pressure in the 80s overnight.  Blood pressure this morning is 98/55 and Isordil was held. -Hoped to discharge patient today however we may need to straighten out how to deal with her chest discomfort.  Will reassess after patient gets up and moves around today.  Dr. Oval Linsey will see the patient.  CAD -Status post recent PCI with DES to RCA and OM 01/17/2019.  As above LHC on 03/03/2019 showed stent thrombosis of OM stent unable to be intervened on.  Hypertension -Patient has been hypotensive.  She is started on midodrine in hopes to be able to tolerate a long-acting nitrate.  Hyperlipidemia -On Crestor at max dose for ESRD.   Diabetes type 2 -A1c 7.2 last month.  On Metformin  at home, currently on hold while hospitalized.  Chronic diastolic CHF -Fluid status managed with peritoneal dialysis  ESRD -On peritoneal dialysis.  Being followed by nephrology      For questions or updates, please contact [facility] Please consult [email or web address] for contact info under        Signed, Daune Perch, NP  03/07/2019, 9:16 AM

## 2019-03-07 NOTE — Progress Notes (Signed)
PT Cancellation Note  Patient Details Name: Brenda Davis MRN: [id number] DOB: 1965-06-19   Cancelled Treatment:    Reason Eval/Treat Not Completed: Fatigue/lethargy limiting ability to participate; attempted twice to see pt today, c/o fatigue/lethargy both times.  Encouraged up OOB at least once today, reports will get nursing assist to help walk in the room in next hour. RN made aware.    [name] 03/07/2019, 4:21 PM Magda Kiel, [place] [phone] 03/07/2019

## 2019-03-07 NOTE — Progress Notes (Signed)
Patient c/o acute 7/10 aching chest pain in the center of her chest. Patient's BP 138/68. EKG obtained; 2L oxygen applied via nasal cannula. Spoke with Pecolia Ades NP who discussed with Dr. Oval Linsey. Received orders to give dose of isordil and dose of PRN morphine.   Patient currently reports pain as a 1/10 and states she feels much better. BP stable at 120/63. Will continue to monitor.

## 2019-03-07 NOTE — Progress Notes (Signed)
[facility] Progress Note   Subjective:  Seen in room - sleepy this morning. Looks like BP has been dropping precluding needed isosorbide therapy - started on midodrine 66m BID this morning - watching for effect. Denies dyspnea or abd pain. PD went fine overnight - 647mUF.  Objective Vitals:   03/07/19 0538 03/07/19 0728 03/07/19 0854 03/07/19 1013  BP: (!) 89/52 (!) 91/55 (!) 98/55 (!) 95/53  Pulse: 95  73 70  Resp: 20     Temp: 99.3 F (37.4 C) 99 F (37.2 C)    TempSrc: Oral Oral    SpO2: 97%  97% 98%  Weight:  126.6 kg    Height:       Physical Exam General: Well appearing woman, NAD Heart: RRR; no murmur Lungs: CTAB Abdomen: soft, non-tender. PD cath in place Extremities: No LE edema Dialysis Access:  PD cath  Additional Objective Labs: Basic Metabolic Panel: Recent Labs  Lab 03/02/19 1720 03/03/19 0057 03/03/19 0622 03/04/19 0826 03/07/19 0427  NA   < >  --  141 138 132*  K   < >  --  3.2* 3.7 3.4*  CL   < >  --  99 97* 92*  CO2   < >  --  26 23 23   GLUCOSE   < >  --  98 108* 88  BUN   < >  --  49* 49* 45*  CREATININE   < >  --  15.56* 15.43* 15.44*  CALCIUM   < >  --  8.2* 7.8* 6.8*  PHOS  --  6.2*  --   --   --    < > = values in this interval not displayed.   CBC: Recent Labs  Lab 03/02/19 1720 03/02/19 1720 03/03/19 0622 03/03/19 0622 03/04/19 0826 03/05/19 1134 03/06/19 0358  WBC 9.8   < > 9.6   < > 9.5 9.9 7.9  NEUTROABS 7.0  --   --   --   --   --   --   HGB 12.0   < > 10.9*   < > 11.3* 11.1* 11.5*  HCT 38.0   < > 34.0*   < > 35.8* 34.9* 36.7  MCV 100.5*  --  98.6  --  98.9 99.4 101.4*  PLT 479*   < > 412*   < > 476* 430* 411*   < > = values in this interval not displayed.   CBG: Recent Labs  Lab 03/06/19 1629 03/06/19 2015 03/07/19 0609 03/07/19 0746 03/07/19 1152  GLUCAP 92 99 85 78 91   Medications: . sodium chloride 10 mL/hr at 03/03/19 0800   . aspirin EC  81 mg Oral Daily  . calcium acetate  667 mg  Oral TID WC  . cinacalcet  90 mg Oral QHS  . feeding supplement (PRO-STAT SUGAR FREE 64)  30 mL Oral BID  . ferric citrate  420 mg Oral TID WC  . folic acid  1 mg Oral Daily  . gabapentin  100 mg Oral BID  . gentamicin cream  1 application Topical Daily  . heparin injection (subcutaneous)  5,000 Units Subcutaneous Q8H  . hydrocortisone   Topical QID  . insulin aspart  0-6 Units Subcutaneous TID WC  . isosorbide dinitrate  10 mg Oral BID  . midodrine  10 mg Oral TID WC  . pantoprazole  40 mg Oral BID AC  . potassium chloride  40 mEq Oral Once  .  rosuvastatin  10 mg Oral Daily  . ticagrelor  90 mg Oral BID  . vitamin B-12  1,000 mcg Oral Daily    Dialysis Orders: CCPD 6 exchanges 2.7Lfill.1.5h dwell No day dwell.(Patient says she has been doing 5 exchanges)  Assessment/Plan: 1. CAD/Unstable angina. Known multivessel disease. LHC 2/12 with progressive disease, occlusion of recent OM stent. Cardiology following - medical therapy for now - started on isosorbide which has helped CP but leading to hypotensiion -> midodrine 68m BID added, now titrated to 144mTID, follow. On aspirin/Brilinta. 2. ESRD: Continue CCPD - changed to all 1.5% fluids as of 2/15 - follow. 3. Hypokalemia: Improving, now low sided again. For another 404mKCl today. 4. Hypertension/volume: See above. On isosorbide + mido now. 5. Anemia - Hgb 11.5. No ESA needs. Follow trends. 6. Metabolic bone disease: Ca dropping, Phos high. ^ Phoslo to 2/meals and hold sensipar for now. 7. Nutrition: Continue pro-stat supplements while here. 8. GERD: on PPI   KatVeneta PentonA-C 03/07/2019, 12:04 PM  [facility] Pager: [phone]

## 2019-03-07 NOTE — Progress Notes (Signed)
CRITICAL VALUE ALERT  Critical Value:  Trop=2,970  Date & Time Notied:  03/07/19;0635  Provider Notified: MD on call Baltazar Najjar   Orders Received/Actions taken: no further orders received;will continue to monitor pt.

## 2019-03-07 NOTE — Progress Notes (Signed)
PROGRESS NOTE    DACI STUBBE  NIO:[id number] DOB: 1965/02/06 DOA: 03/02/2019 PCP: [name], MD   Brief Narrative:  Patient is a 60 old female with history of end-stage renal disease on peritoneal dialysis, coronary artery disease status post multiple PCIs, chronic diastolic heart failure, diabetes, hyperlipidemia who presents to the emergency department on February 11 with complaints of chest pain.  Work-up revealed elevated troponin concerning for NSTEMI.  Cardiology was consulted.  She underwent cardiac cath with plans for medical management.  Nephrology and cardiology are following.  Hospital course remarkable for hypertension, chest pain, elevated troponin.  Current plan is to continue the medical management and monitor.Plan for discharge to home with home health when she is chest pain-free.   Assessment & Plan:   Principal Problem:   Coronary artery disease involving native coronary artery of native heart with unstable angina pectoris ([place]) Active Problems:   Morbid obesity with BMI of 40.0-44.9, adult (HCC)   Anemia of chronic disease   Chronic heart failure with preserved ejection fraction (HCC)   ESRD on peritoneal dialysis ([place])   DM (diabetes mellitus), type 2 with renal complications (HCC)   NSTEMI (non-ST elevated myocardial infarction) (HCC)   Elevated troponin   Chest pain   Unstable angina (HCC)   Unstable angina: History of coronary artery disease.  Presented with chest pain.  Had previous PCI with DES on 01/17/2019.  Cardiology was consulted.  Underwent cardiac cath on 2/12 with finding of progessive disease, stent thrombosis of OM.  PCI not done this time.  Plan for medical management.  Continue statin, aspirin, Brilinta. Heparin drip discontinued.  Continue nitrates as needed for chest pain.She has been started on Isordil  Her troponin trended up significantly.  Cardiology closely following.Plan for monitoring.  End-stage renal disease on  dialysis: Continue peritoneal dialysis.  Creatinine in the range  15.  Nephrology following.   Hyponatremia: Most likely secondary to hypervolemic hyponatremia associated with ESRD.  Volume is managed with peritoneal dialysis.  Continue to monitor.  Right forearm swelling/pain: Most likely associated with cath procedure.  Venous Doppler negative for DVT.  Pain has  improved today.  Hypotension: Currently blood pressure soft.  She was unable to tolerate beta-blocker due to hypotension in the past.  Continue to monitor.  Started on midodrine , dose increased to 10 mg.  Chronic diastolic CHF: Currently compensated.  Volume is managed with peritoneal dialysis.  Diabetes type 2: Hemoglobin A1c 7.2 as per last month.  She takes Metformin at home.  Continue sliding scale insulin.  Morbid obesity: BMI of 45.86.  GERD: Continue PPI  Hypokalemia: Supplemented with potassium.  Debility/deconditioning: We have requested physical therapy evaluation.  She lives with her husband and ambulates without problem at home.  Physical therapy did not recommend any follow-up requirement         DVT prophylaxis:heparin [place] Code Status: Full Family Communication: None present at the bedside.  Patient has been communicating with her husband. Disposition Plan: Patient is from home.    She is not stable for discharge today because of hypotension, troponin has trended up.  She needs to be cleared by cardiology before discharge.   Consultants: Cardiology, nephrology  Procedures: Cath  Antimicrobials:  Anti-infectives (From admission, onward)   None      Subjective:  Patient seen and examined at the bedside this morning.  Blood pressure still soft.  Dose of midodrine increased.  She states she had some chest pain last night and this  morning but currently chest pain-free.  Right arm pain has resolved.  Denies any shortness of breath..   Objective: Vitals:   03/07/19 0538 03/07/19 0728 03/07/19 0854  03/07/19 1013  BP: (!) 89/52 (!) 91/55 (!) 98/55 (!) 95/53  Pulse: 95  73 70  Resp: 20     Temp: 99.3 F (37.4 C) 99 F (37.2 C)    TempSrc: Oral Oral    SpO2: 97%  97% 98%  Weight:  126.6 kg    Height:        Intake/Output Summary (Last 24 hours) at 03/07/2019 1122 Last data filed at 03/07/2019 0730 Gross per 24 hour  Intake 12855 ml  Output 644 ml  Net 12211 ml   Filed Weights   03/05/19 2100 03/07/19 0500 03/07/19 0728  Weight: 125 kg 126.1 kg 126.6 kg    Examination:   General exam: Generalized weakness, chronically ill looking, morbidly obese Respiratory system: Bilateral equal air entry, normal vesicular breath sounds, no wheezes or crackles  Cardiovascular system: S1 & S2 heard, RRR. No JVD, murmurs, rubs, gallops or clicks. Gastrointestinal system: Abdomen is nondistended, soft and nontender. No organomegaly or masses felt. Normal bowel sounds heard.  Peritoneal dialysis catheter Central nervous system: Alert and oriented. No focal neurological deficits. Extremities: No edema, no clubbing ,no cyanosis, distal peripheral pulses palpable. Skin: No rashes, lesions or ulcers,no icterus ,no pallor   Data Reviewed: I have personally reviewed following labs and imaging studies  CBC: Recent Labs  Lab 03/02/19 1720 03/03/19 0622 03/04/19 0826 03/05/19 1134 03/06/19 0358  WBC 9.8 9.6 9.5 9.9 7.9  NEUTROABS 7.0  --   --   --   --   HGB 12.0 10.9* 11.3* 11.1* 11.5*  HCT 38.0 34.0* 35.8* 34.9* 36.7  MCV 100.5* 98.6 98.9 99.4 101.4*  PLT 479* 412* 476* 430* 194*   Basic Metabolic Panel: Recent Labs  Lab 03/02/19 1720 03/03/19 0057 03/03/19 0622 03/04/19 0826 03/07/19 0427  NA 139  --  141 138 132*  K 3.3*  --  3.2* 3.7 3.4*  CL 97*  --  99 97* 92*  CO2 27  --  26 23 23   GLUCOSE 155*  --  98 108* 88  BUN 46*  --  49* 49* 45*  CREATININE 14.41*  --  15.56* 15.43* 15.44*  CALCIUM 8.7*  --  8.2* 7.8* 6.8*  MG  --  1.8  --   --   --   PHOS  --  6.2*  --   --    --    GFR: Estimated Creatinine Clearance: 5.6 mL/min (A) (by C-G formula based on SCr of 15.44 mg/dL (H)). Liver Function Tests: No results for input(s): AST, ALT, ALKPHOS, BILITOT, PROT, ALBUMIN in the last 168 hours. No results for input(s): LIPASE, AMYLASE in the last 168 hours. No results for input(s): AMMONIA in the last 168 hours. Coagulation Profile: No results for input(s): INR, PROTIME in the last 168 hours. Cardiac Enzymes: No results for input(s): CKTOTAL, CKMB, CKMBINDEX, TROPONINI in the last 168 hours. BNP (last 3 results) No results for input(s): PROBNP in the last 8760 hours. HbA1C: No results for input(s): HGBA1C in the last 72 hours. CBG: Recent Labs  Lab 03/06/19 1148 03/06/19 1629 03/06/19 2015 03/07/19 0609 03/07/19 0746  GLUCAP 120* 92 99 85 78   Lipid Profile: No results for input(s): CHOL, HDL, LDLCALC, TRIG, CHOLHDL, LDLDIRECT in the last 72 hours. Thyroid Function Tests: No results for input(s): TSH,  T4TOTAL, FREET4, T3FREE, THYROIDAB in the last 72 hours. Anemia Panel: No results for input(s): VITAMINB12, FOLATE, FERRITIN, TIBC, IRON, RETICCTPCT in the last 72 hours. Sepsis Labs: No results for input(s): PROCALCITON, LATICACIDVEN in the last 168 hours.  Recent Results (from the past 240 hour(s))  SARS CORONAVIRUS 2 (TAT 6-24 HRS) Nasopharyngeal Nasopharyngeal Swab     Status: None   Collection Time: 03/02/19 11:47 PM   Specimen: Nasopharyngeal Swab  Result Value Ref Range Status   SARS Coronavirus 2 NEGATIVE NEGATIVE Final    Comment: (NOTE) SARS-CoV-2 target nucleic acids are NOT DETECTED. The SARS-CoV-2 RNA is generally detectable in upper and lower respiratory specimens during the acute phase of infection. Negative results do not preclude SARS-CoV-2 infection, do not rule out co-infections with other pathogens, and should not be used as the sole basis for treatment or other patient management decisions. Negative results must be combined  with clinical observations, patient history, and epidemiological information. The expected result is Negative. Fact Sheet for Patients: SugarRoll.be Fact Sheet for Healthcare Providers: https://www.woods-mathews.com/ This test is not yet approved or cleared by the [place] FDA and  has been authorized for detection and/or diagnosis of SARS-CoV-2 by FDA under an Emergency Use Authorization (EUA). This EUA will remain  in effect (meaning this test can be used) for the duration of the COVID-19 declaration under Section 56 4(b)(1) of the Act, 21 U.S.C. section 360bbb-3(b)(1), unless the authorization is terminated or revoked sooner. Performed at [facility], [place] [address] [place], [place] [address]          Radiology Studies: No results found.      Scheduled Meds: . aspirin EC  81 mg Oral Daily  . calcium acetate  667 mg Oral TID WC  . cinacalcet  90 mg Oral QHS  . feeding supplement (PRO-STAT SUGAR FREE 64)  30 mL Oral BID  . ferric citrate  420 mg Oral TID WC  . folic acid  1 mg Oral Daily  . gabapentin  100 mg Oral BID  . gentamicin cream  1 application Topical Daily  . heparin injection (subcutaneous)  5,000 Units Subcutaneous Q8H  . hydrocortisone   Topical QID  . insulin aspart  0-6 Units Subcutaneous TID WC  . isosorbide dinitrate  10 mg Oral BID  . midodrine  10 mg Oral TID WC  . pantoprazole  40 mg Oral BID AC  . rosuvastatin  10 mg Oral Daily  . ticagrelor  90 mg Oral BID  . vitamin B-12  1,000 mcg Oral Daily   Continuous Infusions: . sodium chloride 10 mL/hr at 03/03/19 0800     LOS: 4 days    Time spent: 35 mins.More than 50% of that time was spent in counseling and/or coordination of care.      Shelly Coss, MD [facility] P2/16/2021, 11:22 AM

## 2019-03-08 ENCOUNTER — Inpatient Hospital Stay (HOSPITAL_COMMUNITY): Payer: Medicare Other

## 2019-03-08 LAB — COMPREHENSIVE METABOLIC PANEL
ALT: 19 U/L (ref 0–44)
AST: 33 U/L (ref 15–41)
Albumin: 2.7 g/dL — ABNORMAL LOW (ref 3.5–5.0)
Alkaline Phosphatase: 108 U/L (ref 38–126)
Anion gap: 20 — ABNORMAL HIGH (ref 5–15)
BUN: 46 mg/dL — ABNORMAL HIGH (ref 6–20)
CO2: 20 mmol/L — ABNORMAL LOW (ref 22–32)
Calcium: 6.8 mg/dL — ABNORMAL LOW (ref 8.9–10.3)
Chloride: 93 mmol/L — ABNORMAL LOW (ref 98–111)
Creatinine, Ser: 15.42 mg/dL — ABNORMAL HIGH (ref 0.44–1.00)
GFR calc Af Amer: 3 mL/min — ABNORMAL LOW (ref >60)
GFR calc non Af Amer: 2 mL/min — ABNORMAL LOW (ref >60)
Glucose, Bld: 117 mg/dL — ABNORMAL HIGH (ref 70–99)
Potassium: 4.5 mmol/L (ref 3.5–5.1)
Sodium: 133 mmol/L — ABNORMAL LOW (ref 135–145)
Total Bilirubin: 0.6 mg/dL (ref 0.3–1.2)
Total Protein: 6.5 g/dL (ref 6.5–8.1)

## 2019-03-08 LAB — GRAM STAIN

## 2019-03-08 LAB — GLUCOSE, CAPILLARY
Glucose-Capillary: 106 mg/dL — ABNORMAL HIGH (ref 70–99)
Glucose-Capillary: 108 mg/dL — ABNORMAL HIGH (ref 70–99)
Glucose-Capillary: 107 mg/dL — ABNORMAL HIGH (ref 70–99)
Glucose-Capillary: 119 mg/dL — ABNORMAL HIGH (ref 70–99)

## 2019-03-08 LAB — CBC WITH DIFFERENTIAL/PLATELET
Abs Immature Granulocytes: 0.23 10*3/uL — ABNORMAL HIGH (ref 0.00–0.07)
Basophils Absolute: 0 10*3/uL (ref 0.0–0.1)
Basophils Relative: 0 %
Eosinophils Absolute: 0.1 10*3/uL (ref 0.0–0.5)
Eosinophils Relative: 1 %
HCT: 37.2 % (ref 36.0–46.0)
Hemoglobin: 11.6 g/dL — ABNORMAL LOW (ref 12.0–15.0)
Immature Granulocytes: 2 %
Lymphocytes Relative: 11 %
Lymphs Abs: 1.1 10*3/uL (ref 0.7–4.0)
MCH: 31.3 pg (ref 26.0–34.0)
MCHC: 31.2 g/dL (ref 30.0–36.0)
MCV: 100.3 fL — ABNORMAL HIGH (ref 80.0–100.0)
Monocytes Absolute: 1.6 10*3/uL — ABNORMAL HIGH (ref 0.1–1.0)
Monocytes Relative: 15 %
Neutro Abs: 7.4 10*3/uL (ref 1.7–7.7)
Neutrophils Relative %: 71 %
Platelets: 386 10*3/uL (ref 150–400)
RBC: 3.71 MIL/uL — ABNORMAL LOW (ref 3.87–5.11)
RDW: 16.9 % — ABNORMAL HIGH (ref 11.5–15.5)
WBC: 10.5 10*3/uL (ref 4.0–10.5)
nRBC: 1.2 % — ABNORMAL HIGH (ref 0.0–0.2)

## 2019-03-08 LAB — BODY FLUID CELL COUNT WITH DIFFERENTIAL
Lymphs, Fluid: 50 %
Monocyte-Macrophage-Serous Fluid: 41 % — ABNORMAL LOW (ref 50–90)
Neutrophil Count, Fluid: 9 % (ref 0–25)
Total Nucleated Cell Count, Fluid: 19 cu mm (ref 0–1000)

## 2019-03-08 LAB — PHOSPHORUS: Phosphorus: 4 mg/dL (ref 2.5–4.6)

## 2019-03-08 LAB — LACTIC ACID, PLASMA: Lactic Acid, Venous: 1.4 mmol/L (ref 0.5–1.9)

## 2019-03-08 LAB — PROCALCITONIN: Procalcitonin: 6.71 ng/mL

## 2019-03-08 LAB — C-REACTIVE PROTEIN: CRP: 10.6 mg/dL — ABNORMAL HIGH (ref <1.0)

## 2019-03-08 MED ORDER — VANCOMYCIN VARIABLE DOSE PER UNSTABLE RENAL FUNCTION (PHARMACIST DOSING): Status: DC

## 2019-03-08 MED ORDER — SODIUM CHLORIDE 0.9 % IV SOLN
1.0000 g | INTRAVENOUS | Status: DC
  Administered 2019-03-09 – 2019-03-12 (×4): 1 g via INTRAVENOUS
  Filled 2019-03-08 (×6): qty 1

## 2019-03-08 MED ORDER — SODIUM CHLORIDE 0.9 % IV BOLUS
500.0000 mL | Freq: Once | INTRAVENOUS | Status: AC
  Administered 2019-03-08: 11:00:00 500 mL via INTRAVENOUS

## 2019-03-08 MED ORDER — VANCOMYCIN HCL 10 G IV SOLR
2500.0000 mg | Freq: Once | INTRAVENOUS | Status: AC
  Administered 2019-03-08: 15:00:00 2500 mg via INTRAVENOUS
  Filled 2019-03-08: qty 2500

## 2019-03-08 MED ORDER — ACETAMINOPHEN 325 MG PO TABS
650.0000 mg | ORAL_TABLET | Freq: Four times a day (QID) | ORAL | Status: DC | PRN
  Administered 2019-03-08 – 2019-03-12 (×3): 650 mg via ORAL
  Filled 2019-03-08 (×5): qty 2

## 2019-03-08 MED ORDER — SODIUM CHLORIDE 0.9 % IV SOLN
2.0000 g | Freq: Once | INTRAVENOUS | Status: AC
  Administered 2019-03-08: 16:00:00 2 g via INTRAVENOUS
  Filled 2019-03-08: qty 2

## 2019-03-08 MED ORDER — GABAPENTIN 100 MG PO CAPS
100.0000 mg | ORAL_CAPSULE | Freq: Every day | ORAL | Status: DC
  Administered 2019-03-09 – 2019-03-13 (×5): 100 mg via ORAL
  Filled 2019-03-08 (×5): qty 1

## 2019-03-08 MED ORDER — VANCOMYCIN HCL 10 G IV SOLR
2500.0000 mg | Freq: Once | INTRAVENOUS | Status: DC
  Administered 2019-03-08: 2500 mg via INTRAVENOUS
  Filled 2019-03-08: qty 2500

## 2019-03-08 MED ORDER — SODIUM CHLORIDE 0.9 % IV SOLN
1.0000 g | Freq: Once | INTRAVENOUS | Status: DC
  Administered 2019-03-08: 11:00:00 1 g via INTRAVENOUS
  Filled 2019-03-08: qty 10

## 2019-03-08 NOTE — Progress Notes (Signed)
Patient spiked a temp of 102.6. On call MD notified, orders received. Patient is on peritoneal dialysis and does not urinate, UA and urine culture unable to be collected.   Elesa Hacker, RN

## 2019-03-08 NOTE — Progress Notes (Addendum)
PROGRESS NOTE    Brenda Davis  TDS:[id number] DOB: [birth year] DOA: 03/02/2019 PCP: [name], MD   Brief Narrative:  Patient is a 67 old female with history of end-stage renal disease on peritoneal dialysis, coronary artery disease status post multiple PCIs, chronic diastolic heart failure, diabetes, hyperlipidemia who presents to the emergency department on February 11 with complaints of chest pain.  Work-up revealed elevated troponin concerning for NSTEMI.  Cardiology was consulted.  She underwent cardiac cath with plans for medical management.  Nephrology and cardiology are following.  Hospital course remarkable for hypertension, chest pain, elevated troponin.  Current plan is to continue the medical management and monitor.Plan for discharge to home with home health when she is chest pain-free. -2/16-17, overnight with high fevers and some lethargy   Assessment & Plan:   NSTEMI -: History of coronary artery disease.  Presented with chest pain.  Had previous PCI with DES on 01/17/2019.  Cardiology was consulted.  Underwent cardiac cath on 2/12 with finding of progessive disease, stent thrombosis of OM.  PCI not done this time.  Plan for medical management.  Continue statin, aspirin, Brilinta. -She was started on Imdur -cards following -Hold Imdur due to hypotension today  Sepsis -Overnight 2/16-17 am with high fevers ranging from 10 2-1 03 and mild lethargy -Check PD fluid cell count and differential to rule out peritonitis, check chest x-ray -Fluid bolus x1 now -Blood cultures ordered early this morning we will follow up -Start empiric vancomycin and cefepime  End-stage renal disease on dialysis: -Nephrology following, continued on peritoneal dialysis, BUN is stable, creatinine remains significantly high in the 15 range despite PD  Hyponatremia: -Improving  Right forearm swelling/pain: Most likely associated with cath procedure.  Venous Doppler negative for  DVT.  Pain has  improved today.  Hypotension: Currently blood pressure soft.  She was unable to tolerate beta-blocker due to hypotension in the past.  Continue to monitor.  Started on midodrine , dose increased to 10 mg. -BP is worse today likely in the setting of sepsis, continue midodrine, and fluid bolus, stop Imdur  Chronic diastolic CHF: Currently compensated.  Volume is managed with peritoneal dialysis.  Diabetes type 2: Hemoglobin A1c 7.2 as per last month.  She takes Metformin at home.  Continue sliding scale insulin.  Morbid obesity: BMI of 45.86.  GERD: Continue PPI  Hypokalemia: Supplemented with potassium.  Debility/deconditioning:  -  She lives with her husband and ambulates independently at home.  Physical therapy did not recommend any follow-up requirement    DVT prophylaxis:heparin Scissors Code Status: Full Family Communication: None present at the bedside.  Patient has been communicating with her husband. Disposition Plan: Patient is from home, now with sepsis, hypotension and high fevers, pending resolution of this   Consultants: Cardiology, nephrology  Procedures: Cath  Antimicrobials:  Anti-infectives (From admission, onward)   Start     Dose/Rate Route Frequency Ordered Stop   03/08/19 1030  vancomycin (VANCOCIN) 2,500 mg in sodium chloride 0.9 % 500 mL IVPB     2,500 mg 250 mL/hr over 120 Minutes Intravenous  Once 03/08/19 0932     03/08/19 0931  vancomycin variable dose per unstable renal function (pharmacist dosing)      Does not apply See admin instructions 03/08/19 0932     03/08/19 0930  cefTRIAXone (ROCEPHIN) 1 g in sodium chloride 0.9 % 100 mL IVPB     1 g 200 mL/hr over 30 Minutes Intravenous  Once 03/08/19 2620  Subjective: -Lethargic overnight with temp of 10 2-1 03 -Mild cough, denies any shortness of breath   Objective: Vitals:   03/08/19 0659 03/08/19 0700 03/08/19 0800 03/08/19 1000  BP:   (!) 92/53 (!) 82/43  Pulse: (!) 101   100 96  Resp: 17   (!) 22  Temp:  (!) 103 F (39.4 C) (!) 101 F (38.3 C) (!) 102.9 F (39.4 C)  TempSrc:  Oral Oral Oral  SpO2: 91% 94% 95% 96%  Weight:  126.6 kg    Height:       No intake or output data in the 24 hours ending 03/08/19 1117 Filed Weights   03/07/19 0728 03/07/19 1729 03/08/19 0700  Weight: 126.6 kg 125.5 kg 126.6 kg    Examination: Gen: Morbidly obese chronically ill-appearing female, lethargic, arousable, answers questions HEENT: Pupils equal and reactive Lungs: Poor air movement bilaterally CVS: S1-S2, regular rate rhythm  abd: soft, Non tender, non distended, BS present, PD catheter noted Extremities: No edema Skin: no new rashes Central nervous system: Somnolent but arousable, moves all extremities, no localizing signs   Data Reviewed: I have personally reviewed following labs and imaging studies  CBC: Recent Labs  Lab 03/02/19 1720 03/02/19 1720 03/03/19 0622 03/04/19 0826 03/05/19 1134 03/06/19 0358 03/08/19 0126  WBC 9.8   < > 9.6 9.5 9.9 7.9 10.5  NEUTROABS 7.0  --   --   --   --   --  7.4  HGB 12.0   < > 10.9* 11.3* 11.1* 11.5* 11.6*  HCT 38.0   < > 34.0* 35.8* 34.9* 36.7 37.2  MCV 100.5*   < > 98.6 98.9 99.4 101.4* 100.3*  PLT 479*   < > 412* 476* 430* 411* 386   < > = values in this interval not displayed.   Basic Metabolic Panel: Recent Labs  Lab 03/02/19 1720 03/03/19 0057 03/03/19 0622 03/04/19 0826 03/07/19 0427 03/08/19 0126  NA 139  --  141 138 132* 133*  K 3.3*  --  3.2* 3.7 3.4* 4.5  CL 97*  --  99 97* 92* 93*  CO2 27  --  26 23 23  20*  GLUCOSE 155*  --  98 108* 88 117*  BUN 46*  --  49* 49* 45* 46*  CREATININE 14.41*  --  15.56* 15.43* 15.44* 15.42*  CALCIUM 8.7*  --  8.2* 7.8* 6.8* 6.8*  MG  --  1.8  --   --   --   --   PHOS  --  6.2*  --   --   --  4.0   GFR: Estimated Creatinine Clearance: 5.6 mL/min (A) (by C-G formula based on SCr of 15.42 mg/dL (H)). Liver Function Tests: Recent Labs  Lab  03/08/19 0126  AST 33  ALT 19  ALKPHOS 108  BILITOT 0.6  PROT 6.5  ALBUMIN 2.7*   No results for input(s): LIPASE, AMYLASE in the last 168 hours. No results for input(s): AMMONIA in the last 168 hours. Coagulation Profile: No results for input(s): INR, PROTIME in the last 168 hours. Cardiac Enzymes: No results for input(s): CKTOTAL, CKMB, CKMBINDEX, TROPONINI in the last 168 hours. BNP (last 3 results) No results for input(s): PROBNP in the last 8760 hours. HbA1C: No results for input(s): HGBA1C in the last 72 hours. CBG: Recent Labs  Lab 03/07/19 0746 03/07/19 1152 03/07/19 1633 03/07/19 2131 03/08/19 0820  GLUCAP 78 91 91 97 106*   Lipid Profile: No results for input(s): CHOL,  HDL, LDLCALC, TRIG, CHOLHDL, LDLDIRECT in the last 72 hours. Thyroid Function Tests: No results for input(s): TSH, T4TOTAL, FREET4, T3FREE, THYROIDAB in the last 72 hours. Anemia Panel: No results for input(s): VITAMINB12, FOLATE, FERRITIN, TIBC, IRON, RETICCTPCT in the last 72 hours. Sepsis Labs: Recent Labs  Lab 03/08/19 0126  LATICACIDVEN 1.4    Recent Results (from the past 240 hour(s))  SARS CORONAVIRUS 2 (TAT 6-24 HRS) Nasopharyngeal Nasopharyngeal Swab     Status: None   Collection Time: 03/02/19 11:47 PM   Specimen: Nasopharyngeal Swab  Result Value Ref Range Status   SARS Coronavirus 2 NEGATIVE NEGATIVE Final    Comment: (NOTE) SARS-CoV-2 target nucleic acids are NOT DETECTED. The SARS-CoV-2 RNA is generally detectable in upper and lower respiratory specimens during the acute phase of infection. Negative results do not preclude SARS-CoV-2 infection, do not rule out co-infections with other pathogens, and should not be used as the sole basis for treatment or other patient management decisions. Negative results must be combined with clinical observations, patient history, and epidemiological information. The expected result is Negative. Fact Sheet for  Patients: SugarRoll.be Fact Sheet for Healthcare Providers: https://www.woods-mathews.com/ This test is not yet approved or cleared by the [place] FDA and  has been authorized for detection and/or diagnosis of SARS-CoV-2 by FDA under an Emergency Use Authorization (EUA). This EUA will remain  in effect (meaning this test can be used) for the duration of the COVID-19 declaration under Section 56 4(b)(1) of the Act, 21 U.S.C. section 360bbb-3(b)(1), unless the authorization is terminated or revoked sooner. Performed at [facility], [place] [address] [place], [place] [address]          Radiology Studies: DG Chest Port 1 View  Result Date: 03/08/2019 CLINICAL DATA:  Fever, cough. EXAM: PORTABLE CHEST 1 VIEW COMPARISON:  03/02/2019. FINDINGS: Trachea is midline. Heart size is accentuated by AP semi upright technique and low lung volumes. Thoracic aorta is calcified. Mild bibasilar airspace opacification, new from 03/02/2019. No definite pleural fluid. IMPRESSION: 1. New mild bibasilar airspace opacification may be due in part to lower lung volumes than on 03/02/2019. The possibility of pneumonia, including due to COVID-19, is also considered. 2.  Aortic atherosclerosis (ICD10-I70.0). Electronically Signed   By: Lorin Picket M.D.   On: 03/08/2019 09:23        Scheduled Meds: . aspirin EC  81 mg Oral Daily  . calcium acetate  1,334 mg Oral TID WC  . feeding supplement (PRO-STAT SUGAR FREE 64)  30 mL Oral BID  . ferric citrate  420 mg Oral TID WC  . folic acid  1 mg Oral Daily  . gabapentin  100 mg Oral BID  . gentamicin cream  1 application Topical Daily  . heparin injection (subcutaneous)  5,000 Units Subcutaneous Q8H  . hydrocortisone   Topical QID  . insulin aspart  0-6 Units Subcutaneous TID WC  . midodrine  10 mg Oral TID WC  . pantoprazole  40 mg Oral BID AC  . rosuvastatin  10 mg Oral Daily  . ticagrelor  90 mg Oral BID   . vancomycin variable dose per unstable renal function (pharmacist dosing)   Does not apply See admin instructions  . vitamin B-12  1,000 mcg Oral Daily   Continuous Infusions: . sodium chloride 10 mL/hr at 03/03/19 0800  . cefTRIAXone (ROCEPHIN)  IV 1 g (03/08/19 1055)  . sodium chloride    . vancomycin       LOS:  5 days    Time spent: 35 mins.More than 50% of that time was spent in counseling and/or coordination of care.   Domenic Polite, MD [facility] P2/17/2021, 11:17 AM

## 2019-03-08 NOTE — Progress Notes (Signed)
Pt. Filled with 1536m PD fluid. Fluid to dwell for a minimum 1hr, then drain and obtain PD fluid for culture and cell count. HD charge nurse made aware.

## 2019-03-08 NOTE — Progress Notes (Signed)
PT Cancellation Note  Patient Details Name: Brenda Davis MRN: [id number] DOB: July 26, 1965   Cancelled Treatment:    Reason Eval/Treat Not Completed: Medical issues which prohibited therapy patient with rapid response event due to hypotension this morning. Also with fever and lethargy per MD notes. RN confirms patient not medically appropriate today. Will continue to follow acutely.    Windell Norfolk, DPT, PN1   Supplemental Physical Therapist [facility]    Pager [phone] Acute Rehab Office [phone]

## 2019-03-08 NOTE — Progress Notes (Signed)
Pharmacy Antibiotic Note  Brenda Davis is a 54 y.o. female admitted on 03/02/2019 with chest pain.  Pharmacy has been consulted for vancomycin and cefepime dosing.  Patient hypotensive/lethargic this morning after being febrile to 103 last night. WBC wnl at 10 this am. Concern for sepsis this am, will start empiric vancomycin and cefepime.   Of note patient is ESRD currently continued on CCPD while hospitalized.   Will load antibiotics now.   Plan: Load vancomycin 2.5g IV now, plan on checking level in 3-4 days Cefepime 2g IV now then 1g IV q24 hours Primary team checking PD fluid to rule out peritonitis  Height: 5' 5"  (165.1 cm) Weight: 279 lb 1.6 oz (126.6 kg) IBW/kg (Calculated) : 57  Temp (24hrs), Avg:101.2 F (38.4 C), Min:99 F (37.2 C), Max:103 F (39.4 C)  Recent Labs  Lab 03/02/19 1720 03/02/19 1720 03/03/19 0622 03/04/19 0826 03/05/19 1134 03/06/19 0358 03/07/19 0427 03/08/19 0126  WBC 9.8   < > 9.6 9.5 9.9 7.9  --  10.5  CREATININE 14.41*  --  15.56* 15.43*  --   --  15.44* 15.42*  LATICACIDVEN  --   --   --   --   --   --   --  1.4   < > = values in this interval not displayed.    Estimated Creatinine Clearance: 5.6 mL/min (A) (by C-G formula based on SCr of 15.42 mg/dL (H)).    Allergies  Allergen Reactions  . Amlodipine Swelling  . Atorvastatin     Elevated LFT's  . Clonidine Derivatives Swelling    Limbs swell  . Doxycycline Nausea And Vomiting    "I threw up for 3 hours"  . Welchol [Colesevelam Hcl] Nausea Only    Thank you for allowing pharmacy to be a part of this patient's care.  Erin Hearing PharmD., BCPS Clinical Pharmacist 03/08/2019 2:13 PM

## 2019-03-08 NOTE — Significant Event (Signed)
Rapid Response Event Note  Overview: Time Called: 7035 Arrival Time: 0093 Event Type: Hypotension Febrile 102.13F oral, hypotension BP 82/43. Pt had peritoneal dialysis this morning.  Initial Focused Assessment: Pt lying in bed. Warm, dry to touch. Pt able to follow commands and correctly answers orientation questions, pt is unable to tell me why she is in the hospital. Pt answers are intermittently delayed and other times she answers "um" and is unable to produce an answer to questions asked. PERRLA, 47m. Horizontal eye movement present. Grips equal. No drift to bilateral upper or lower extremities. Equal sensation in upper extremities, lower extremities, and face. Face symmetrical. Speech is clear.  Interventions: -Ice packs to axilla and groin. -PRN Tylenol given. -500cc IVF bolus. -Room temperature set to cool, excess garments removed from pt.  -Pt status changed to SD level of care.   Plan of Care (if not transferred): -Trend pt's temperature, BP,  and monitor for improved mentation. -Follow-up with primary MD regarding results of tests pending. -IV antibiotics ordered and Midodrine to start for BP support.  Call rapid response for further needs.    Event Summary: Name of Physician Notified: Dr. JBroadus Johnat 1110   Dr. JDelene Lollwas at bedside 30 minutes- 1 hour prior to my visit. MD feels mentation is related to fever and is also ordering sepsis work-up on pt.     at    Outcome: Stayed in room and stabalized  Event End Time: 1[place]

## 2019-03-08 NOTE — Progress Notes (Signed)
Progress Note  Patient Name: Brenda Davis Date of Encounter: 03/08/2019  Primary Cardiologist: Skeet Latch, MD  Subjective   Patient gives limited answers, appears lethargic. Denies CP or SOB. Temp 103 this morning. Does not feel any specific symptoms.  Inpatient Medications    Scheduled Meds: . aspirin EC  81 mg Oral Daily  . calcium acetate  1,334 mg Oral TID WC  . feeding supplement (PRO-STAT SUGAR FREE 64)  30 mL Oral BID  . ferric citrate  420 mg Oral TID WC  . folic acid  1 mg Oral Daily  . gabapentin  100 mg Oral BID  . gentamicin cream  1 application Topical Daily  . heparin injection (subcutaneous)  5,000 Units Subcutaneous Q8H  . hydrocortisone   Topical QID  . insulin aspart  0-6 Units Subcutaneous TID WC  . isosorbide dinitrate  10 mg Oral BID  . midodrine  10 mg Oral TID WC  . pantoprazole  40 mg Oral BID AC  . rosuvastatin  10 mg Oral Daily  . ticagrelor  90 mg Oral BID  . vitamin B-12  1,000 mcg Oral Daily   Continuous Infusions: . sodium chloride 10 mL/hr at 03/03/19 0800   PRN Meds: acetaminophen, diclofenac Sodium, gentamicin cream, dianeal solution for CAPD/CCPD with heparin, dianeal solution for CAPD/CCPD with heparin, HYDROcodone-acetaminophen, morphine injection, nitroGLYCERIN, ondansetron (ZOFRAN) IV   Vital Signs    Vitals:   03/08/19 0240 03/08/19 0403 03/08/19 0659 03/08/19 0700  BP:      Pulse:  100 (!) 101   Resp:   17   Temp: (!) 102.9 F (39.4 C) (!) 101.5 F (38.6 C)  (!) 103 F (39.4 C)  TempSrc: Oral Oral  Oral  SpO2:  90% 91% 94%  Weight:    126.6 kg  Height:       No intake or output data in the 24 hours ending 03/08/19 0816 Last 3 Weights 03/08/2019 03/07/2019 03/07/2019  Weight (lbs) 279 lb 1.6 oz 276 lb 10.8 oz 279 lb 1.6 oz  Weight (kg) 126.6 kg 125.5 kg 126.6 kg     Telemetry    NSR/borderline sinus tach - Personally Reviewed  Physical Exam   GEN: No acute distress.  HEENT: Normocephalic,  atraumatic, sclera non-icteric. Neck: No JVD or bruits. Cardiac: RRR no murmurs, rubs, or gallops.  Radials/DP/PT 1+ and equal bilaterally.  Respiratory: Clear to auscultation bilaterally. Breathing is unlabored. GI: Soft, nontender, non-distended, BS +x 4. MS: no deformity. Extremities: No clubbing or cyanosis. No edema. Distal pedal pulses are 2+ and equal bilaterally. Neuro: Awake but lethargic, slowed mentation. Short answers only. Follows commands. Psych: Flat affect.  Labs    High Sensitivity Troponin:   Recent Labs  Lab 03/02/19 1720 03/02/19 1920 03/05/19 1134 03/06/19 1011 03/07/19 0427  TROPONINIHS 119* 132* 929* 2,454* 2,970*      Cardiac EnzymesNo results for input(s): TROPONINI in the last 168 hours. No results for input(s): TROPIPOC in the last 168 hours.   Chemistry Recent Labs  Lab 03/04/19 0826 03/07/19 0427 03/08/19 0126  NA 138 132* 133*  K 3.7 3.4* 4.5  CL 97* 92* 93*  CO2 23 23 20*  GLUCOSE 108* 88 117*  BUN 49* 45* 46*  CREATININE 15.43* 15.44* 15.42*  CALCIUM 7.8* 6.8* 6.8*  PROT  --   --  6.5  ALBUMIN  --   --  2.7*  AST  --   --  33  ALT  --   --  19  ALKPHOS  --   --  108  BILITOT  --   --  0.6  GFRNONAA 2* 2* 2*  GFRAA 3* 3* 3*  ANIONGAP 18* 17* 20*     Hematology Recent Labs  Lab 03/05/19 1134 03/06/19 0358 03/08/19 0126  WBC 9.9 7.9 10.5  RBC 3.51* 3.62* 3.71*  HGB 11.1* 11.5* 11.6*  HCT 34.9* 36.7 37.2  MCV 99.4 101.4* 100.3*  MCH 31.6 31.8 31.3  MCHC 31.8 31.3 31.2  RDW 16.3* 16.5* 16.9*  PLT 430* 411* 386    Radiology    No results found.  Cardiac Studies   Cardiac Cath 03/03/19  Mid LAD lesion is 75% stenosed.  Ost LAD to Mid LAD lesion is 40% stenosed.  Previously placed Ost 1st Mrg drug eluting stent is widely patent.  Mid Cx to Dist Cx lesion is 55% stenosed with 40% stenosed side branch in Ost 3rd Mrg.  Previously placed Prox RCA drug eluting stent is widely patent.  Ost 1st Mrg to 1st Mrg lesion  is 99% stenosed. Partially in the stent and partially distal to the stent- subacte stent thrombosis.  LV end diastolic pressure is normal.  There is no aortic valve stenosis. Medical therapy for stent thrombosis of the OM stent.  Given the severe tortuosity, I don't think repeat PCI attempt would be beneficial.  There was significant difficulty getting a stent delivered during the last cath.  I think there would be a high likelihood of completely occluding the vessel with wire manipulation.   Patient Profile     54 y.o. female with ESRD on PD, multivessel CAD s/p DES to OM1 and prox RCA 45/4098 (complicated by RPH/ABL anemia and shock), HTN, HLD, DM II, cirrhosis, chronic diastolic CHF, mild anemia presented with chest pain and NSTEMI. Peak hsTroponin 2970. [place] 03/03/19 showed stent thrombosis of OM stent, with recommendation to manage medically.  Assessment & Plan    1. CAD/NSTEMI - continue medical therapy. She is on DAPT with ASA, Brilinta, statin, started on isordil. Her blood pressure has been soft, limiting medical therapy therapy. She seems to be tolerating current combo of isordil and midodrine.  2. Essential HTN with hypotension this admission - now on midodrine.  3. Hyperlipidemia - on max dose Crestor for HD dosing. Prior intolerance of atorvastatin due to elevated LFTs.  4. ESRD on PD - being followed by nephrology.  5. Chronic diastolic CHF - fluid status with perioneal dialysis. LVEDP normal by cath 2/12. EF normal by cath 12/2018.  6. Fever overnight - normal WBC. Last temp 103F. Also noted to be lethargic. Per d/w Dr. Broadus John, her understand is this mentation is not patient's baseline. Further per primary team.  For questions or updates, please contact [place] Please consult [email or web address] for contact info under Cardiology/STEMI.  Signed, Charlie Pitter, PA-C 03/08/2019, 8:16 AM

## 2019-03-08 NOTE — Progress Notes (Signed)
[facility] Progress Note   Subjective:  Spiked fever overnight and had cough and sore throat - BP lower than prior and was lethargic this morning triggering rapid response. No abd pain - PD cell ct, Cx ordered but unfortunately there was delay in drawing it - getting it done now. In mean time, BCx and CXR done - started on IV abx. CXR with ?pneumonia.   Objective Vitals:   03/08/19 0700 03/08/19 0800 03/08/19 1000 03/08/19 1110  BP:  (!) 92/53 (!) 82/43 (!) 93/51  Pulse:  100 96 99  Resp:   (!) 22 14  Temp: (!) 103 F (39.4 C) (!) 101 F (38.3 C) (!) 102.9 F (39.4 C) (!) 101 F (38.3 C)  TempSrc: Oral Oral Oral Oral  SpO2: 94% 95% 96% 95%  Weight: 126.6 kg     Height:       Physical Exam General: NAD, speaking on phone Heart: RRR; no murmur Lungs: CTAB; no rales Abdomen: soft, non-tender Extremities: No LE edema Dialysis Access:  PD cath  Additional Objective Labs: Basic Metabolic Panel: Recent Labs  Lab 03/03/19 0057 03/03/19 0622 03/04/19 0826 03/07/19 0427 03/08/19 0126  NA  --    < > 138 132* 133*  K  --    < > 3.7 3.4* 4.5  CL  --    < > 97* 92* 93*  CO2  --    < > 23 23 20*  GLUCOSE  --    < > 108* 88 117*  BUN  --    < > 49* 45* 46*  CREATININE  --    < > 15.43* 15.44* 15.42*  CALCIUM  --    < > 7.8* 6.8* 6.8*  PHOS 6.2*  --   --   --  4.0   < > = values in this interval not displayed.   Liver Function Tests: Recent Labs  Lab 03/08/19 0126  AST 33  ALT 19  ALKPHOS 108  BILITOT 0.6  PROT 6.5  ALBUMIN 2.7*   CBC: Recent Labs  Lab 03/02/19 1720 03/02/19 1720 03/03/19 0622 03/03/19 0622 03/04/19 0826 03/04/19 0826 03/05/19 1134 03/06/19 0358 03/08/19 0126  WBC 9.8   < > 9.6   < > 9.5   < > 9.9 7.9 10.5  NEUTROABS 7.0  --   --   --   --   --   --   --  7.4  HGB 12.0   < > 10.9*   < > 11.3*   < > 11.1* 11.5* 11.6*  HCT 38.0   < > 34.0*   < > 35.8*   < > 34.9* 36.7 37.2  MCV 100.5*   < > 98.6  --  98.9  --  99.4 101.4*  100.3*  PLT 479*   < > 412*   < > 476*   < > 430* 411* 386   < > = values in this interval not displayed.   CBG: Recent Labs  Lab 03/07/19 1152 03/07/19 1633 03/07/19 2131 03/08/19 0820 03/08/19 1249  GLUCAP 91 91 97 106* 108*   Studies/Results: DG Chest Port 1 View  Result Date: 03/08/2019 CLINICAL DATA:  Fever, cough. EXAM: PORTABLE CHEST 1 VIEW COMPARISON:  03/02/2019. FINDINGS: Trachea is midline. Heart size is accentuated by AP semi upright technique and low lung volumes. Thoracic aorta is calcified. Mild bibasilar airspace opacification, new from 03/02/2019. No definite pleural fluid. IMPRESSION: 1. New mild bibasilar airspace opacification may be  due in part to lower lung volumes than on 03/02/2019. The possibility of pneumonia, including due to COVID-19, is also considered. 2.  Aortic atherosclerosis (ICD10-I70.0). Electronically Signed   By: Lorin Picket M.D.   On: 03/08/2019 09:23   Medications: . sodium chloride 10 mL/hr at 03/03/19 0800  . [START ON 03/09/2019] ceFEPime (MAXIPIME) IV    . ceFEPime (MAXIPIME) IV    . vancomycin 2,500 mg (03/08/19 1512)   . aspirin EC  81 mg Oral Daily  . calcium acetate  1,334 mg Oral TID WC  . feeding supplement (PRO-STAT SUGAR FREE 64)  30 mL Oral BID  . ferric citrate  420 mg Oral TID WC  . folic acid  1 mg Oral Daily  . gabapentin  100 mg Oral BID  . gentamicin cream  1 application Topical Daily  . heparin injection (subcutaneous)  5,000 Units Subcutaneous Q8H  . hydrocortisone   Topical QID  . insulin aspart  0-6 Units Subcutaneous TID WC  . midodrine  10 mg Oral TID WC  . pantoprazole  40 mg Oral BID AC  . rosuvastatin  10 mg Oral Daily  . ticagrelor  90 mg Oral BID  . vancomycin variable dose per unstable renal function (pharmacist dosing)   Does not apply See admin instructions  . vitamin B-12  1,000 mcg Oral Daily    Dialysis Orders: CCPD 6 exchanges 2.7Lfill.1.5h dwell No day dwell.(Patient says she has been doing  5 exchanges)  Assessment/Plan: 1. CAD/Unstable angina. Known multivessel disease. LHC 2/12 with progressive disease, occlusion of recent OM stent. Cardiology following - medical therapy for now- started on isosorbide which has helped CP but leading to hypotension -> midodrine 2m BID added, now titrated to 174mTID, follow.On aspirin/Brilinta. 2. FEVER: Overnight 2/16 with lethargy. No signs of peritonitis, but cell ct + Cx ordered. CXR with new bibasilar opacities, ?pna. BCx drawn -> started on Vanc/Cefepime. 3. ESRD: Continue CCPD - changed to all 1.5% fluids as of 2/15 - follow. 4. Hypokalemia: Improving. 5. Hypertension/volume: See above. On isosorbide + mido now. 6. Anemia - Hgb 11.5. No ESA needs. Follow trends. 7. Metabolic bone disease: Ca dropping, Phos high. ^ Phoslo to 2/meals and sensipar held. 8. Nutrition: Continue pro-stat supplements while here. 9. GERD: on PPI  KaVeneta PentonPA-C 03/08/2019, 4:15 PM  [facility] Pager: [phone]

## 2019-03-09 ENCOUNTER — Inpatient Hospital Stay (HOSPITAL_COMMUNITY): Payer: Medicare Other

## 2019-03-09 LAB — RENAL FUNCTION PANEL
Albumin: 2.6 g/dL — ABNORMAL LOW (ref 3.5–5.0)
Anion gap: 20 — ABNORMAL HIGH (ref 5–15)
BUN: 47 mg/dL — ABNORMAL HIGH (ref 6–20)
CO2: 20 mmol/L — ABNORMAL LOW (ref 22–32)
Calcium: 6.7 mg/dL — ABNORMAL LOW (ref 8.9–10.3)
Chloride: 93 mmol/L — ABNORMAL LOW (ref 98–111)
Creatinine, Ser: 16.11 mg/dL — ABNORMAL HIGH (ref 0.44–1.00)
GFR calc Af Amer: 3 mL/min — ABNORMAL LOW (ref >60)
GFR calc non Af Amer: 2 mL/min — ABNORMAL LOW (ref >60)
Glucose, Bld: 149 mg/dL — ABNORMAL HIGH (ref 70–99)
Phosphorus: 5.4 mg/dL — ABNORMAL HIGH (ref 2.5–4.6)
Potassium: 4.2 mmol/L (ref 3.5–5.1)
Sodium: 133 mmol/L — ABNORMAL LOW (ref 135–145)

## 2019-03-09 LAB — CBC
HCT: 37.2 % (ref 36.0–46.0)
Hemoglobin: 11.5 g/dL — ABNORMAL LOW (ref 12.0–15.0)
MCH: 31.6 pg (ref 26.0–34.0)
MCHC: 30.9 g/dL (ref 30.0–36.0)
MCV: 102.2 fL — ABNORMAL HIGH (ref 80.0–100.0)
Platelets: 299 10*3/uL (ref 150–400)
RBC: 3.64 MIL/uL — ABNORMAL LOW (ref 3.87–5.11)
RDW: 17 % — ABNORMAL HIGH (ref 11.5–15.5)
WBC: 16.5 10*3/uL — ABNORMAL HIGH (ref 4.0–10.5)
nRBC: 0.9 % — ABNORMAL HIGH (ref 0.0–0.2)

## 2019-03-09 LAB — GLUCOSE, CAPILLARY
Glucose-Capillary: 144 mg/dL — ABNORMAL HIGH (ref 70–99)
Glucose-Capillary: 110 mg/dL — ABNORMAL HIGH (ref 70–99)
Glucose-Capillary: 103 mg/dL — ABNORMAL HIGH (ref 70–99)
Glucose-Capillary: 98 mg/dL (ref 70–99)

## 2019-03-09 LAB — SARS CORONAVIRUS 2 (TAT 6-24 HRS): SARS Coronavirus 2: POSITIVE — AB

## 2019-03-09 LAB — PROCALCITONIN: Procalcitonin: 16.09 ng/mL

## 2019-03-09 MED ORDER — SODIUM CHLORIDE 0.9 % IV BOLUS
500.0000 mL | Freq: Once | INTRAVENOUS | Status: AC
  Administered 2019-03-09: 500 mL via INTRAVENOUS

## 2019-03-09 MED ORDER — CALCITRIOL 0.25 MCG PO CAPS
0.2500 ug | ORAL_CAPSULE | Freq: Every day | ORAL | Status: DC
  Administered 2019-03-09 – 2019-03-14 (×6): 0.25 ug via ORAL
  Filled 2019-03-09 (×6): qty 1

## 2019-03-09 MED ORDER — DEXAMETHASONE SODIUM PHOSPHATE 10 MG/ML IJ SOLN
6.0000 mg | Freq: Every day | INTRAMUSCULAR | Status: DC
  Administered 2019-03-09 – 2019-03-11 (×3): 6 mg via INTRAVENOUS
  Filled 2019-03-09 (×3): qty 1
  Filled 2019-03-09: qty 0.6

## 2019-03-09 MED ORDER — SODIUM CHLORIDE 0.9 % IV SOLN
100.0000 mg | Freq: Every day | INTRAVENOUS | Status: AC
  Administered 2019-03-10 – 2019-03-13 (×4): 100 mg via INTRAVENOUS
  Filled 2019-03-09 (×4): qty 20

## 2019-03-09 MED ORDER — SODIUM CHLORIDE 0.9 % IV SOLN
200.0000 mg | Freq: Once | INTRAVENOUS | Status: AC
  Administered 2019-03-09: 21:00:00 200 mg via INTRAVENOUS
  Filled 2019-03-09: qty 200

## 2019-03-09 NOTE — Progress Notes (Signed)
Patient oriented to room and staff 5w11, explained plan of care for covid treatment, questions answered. Patient guarded, dangling edge of bed, reports mild nausea (treated prior to transfer).

## 2019-03-09 NOTE — Progress Notes (Signed)
PHARMACY - PHYSICIAN COMMUNICATION CRITICAL VALUE ALERT - BLOOD CULTURE IDENTIFICATION (BCID)  Brenda Davis is an 54 y.o. female who presented to [facility] on 03/02/2019 with a chief complaint of CP.  Assessment:  Started on broad-spectrum ABX 2/17 for fevers and concern for sepsis, now w/ GPC in clusters growing in 1/4 bottles, awaiting C/S.  Name of physician (or Provider) ContactedModesto Charon NP  Current antibiotics: Cefepime and vancomycin  Changes to prescribed antibiotics recommended:  No changes needed at this time.   Wynona Neat, PharmD, BCPS  03/09/2019  6:44 AM

## 2019-03-09 NOTE — Progress Notes (Addendum)
PROGRESS NOTE    Brenda Davis  PPJ:[id number] DOB: 04-Dec-1965 DOA: 03/02/2019 PCP: [name], MD   Brief Narrative:  Patient is a 29 old female with history of end-stage renal disease on peritoneal dialysis, coronary artery disease status post multiple PCIs, chronic diastolic heart failure, diabetes, hyperlipidemia who presents to the emergency department on February 11 with complaints of chest pain.  Work-up revealed elevated troponin concerning for NSTEMI.  Cardiology was consulted.  She underwent cardiac cath with plans for medical management.  Nephrology and cardiology are following.  Hospital course remarkable for hypertension, chest pain, elevated troponin.  Current plan is to continue the medical management and monitor.Plan for discharge to home with home health when she is chest pain-free. -2/16-17, overnight with high fevers and some lethargy   Assessment & Plan:   NSTEMI -admitted with chest pain, elevated troponins - Had previous PCI with DES on 01/17/2019.  Cardiology was consulted.  Underwent cardiac cath on 2/12 with finding of progessive disease, stent thrombosis of OM.  -Per Dr.Smith 2/12: "The recently stented OM is subtotally occluded beyond the stent with thrombus and there is high grade disease proximal to the stent (not previously noted). Last procedure was complicated by inability to advance stent distally, Feeling is that current situation is not treatable with PCI. RCA stent is okay. There is moderately severe diffuse LAD disease. Plan medical therapy for now. This is a poor prognostic indicator for event free CV survival. Our medical options are limited due to BP".  -Continue statin, aspirin, Brilinta. -She was started on Isordil, unable to tolerate nitrates with hypotension -cards following  Sepsis -Overnight early 2/17 am with high fevers ranging from 102-103 and mild lethargy -Started empiric vancomycin and cefepime 2/17, blood cultures drawn  shows 1 out of 2 with gram-positive cocci in clusters unclear if this is a contaminant or true infection, await speciation, continue current Abx -PD fluid cell count not suggestive of peritonitis , cultures pending  -Chest x-ray yesterday with atelectasis versus basilar infiltrates -Repeat x-ray today with mild basilar infiltrates, repeat temp of 102 this morning with low blood pressures -Ordered another fluid bolus, check Covid PCR, this was negative on admission 6days ago -if worsens will transfer to ICU  End-stage renal disease on dialysis: -Nephrology following, continued on peritoneal dialysis, BUN is stable, creatinine remains significantly high in the 15 range despite PD  Hyponatremia: -Improving  Hypotension: Currently blood pressure soft.  She was unable to tolerate beta-blocker due to hypotension in the past.  Continue to monitor.  Started on midodrine , dose increased to 10 mg TID -BP is worse in the setting of sepsis, continue midodrine, and fluid bolus, stopped isordil  Chronic diastolic CHF:  -Currently compensated.  Volume is managed with peritoneal dialysis.  Diabetes type 2: Hemoglobin A1c 7.2 as per last month.  She takes Metformin at home.  Continue sliding scale insulin.  Morbid obesity: BMI of 45.86.  GERD: Continue PPI  Hypokalemia: Supplemented with potassium.  Debility/deconditioning:  -  She lives with her husband and ambulates independently at home.  Physical therapy did not recommend any follow-up requirement    DVT prophylaxis:heparin [place] Code Status: Full Family Communication: None present at the bedside, called and updated spouse 2/17 and left msg for him this am 2/18 Disposition Plan: Patient is from home, now with sepsis, hypotension and high fevers, pending resolution of this   Consultants: Cardiology, nephrology  Procedures: Cath  Antimicrobials:  Anti-infectives (From admission, onward)   Start  Dose/Rate Route Frequency Ordered Stop    03/09/19 1800  ceFEPIme (MAXIPIME) 1 g in sodium chloride 0.9 % 100 mL IVPB     1 g 200 mL/hr over 30 Minutes Intravenous Every 24 hours 03/08/19 1122     03/08/19 1600  vancomycin (VANCOCIN) 2,500 mg in sodium chloride 0.9 % 500 mL IVPB     2,500 mg 250 mL/hr over 120 Minutes Intravenous  Once 03/08/19 1252 03/08/19 1712   03/08/19 1400  ceFEPIme (MAXIPIME) 2 g in sodium chloride 0.9 % 100 mL IVPB     2 g 200 mL/hr over 30 Minutes Intravenous  Once 03/08/19 1122 03/08/19 1654   03/08/19 1030  vancomycin (VANCOCIN) 2,500 mg in sodium chloride 0.9 % 500 mL IVPB  Status:  Discontinued     2,500 mg 250 mL/hr over 120 Minutes Intravenous  Once 03/08/19 0932 03/08/19 1510   03/08/19 0931  vancomycin variable dose per unstable renal function (pharmacist dosing)      Does not apply See admin instructions 03/08/19 0932     03/08/19 0930  cefTRIAXone (ROCEPHIN) 1 g in sodium chloride 0.9 % 100 mL IVPB  Status:  Discontinued     1 g 200 mL/hr over 30 Minutes Intravenous  Once 03/08/19 0917 03/08/19 1126      Subjective: -Overnight with resolution of fevers and then back again with temp of 102 this morning with drop in blood pressure to 80s which has rebounded to 90/100 range after fluid bolus   Objective: Vitals:   03/09/19 0843 03/09/19 0847 03/09/19 0900 03/09/19 0939  BP: (!) 82/52 (!) 83/45 (!) 92/49 (!) 106/46  Pulse:   (!) 102   Resp: 12 16  17   Temp:   (!) 101.8 F (38.8 C)   TempSrc:   Oral   SpO2:   100%   Weight:   125.8 kg   Height:        Intake/Output Summary (Last 24 hours) at 03/09/2019 1114 Last data filed at 03/08/2019 1512 Gross per 24 hour  Intake 1345.83 ml  Output --  Net 1345.83 ml   Filed Weights   03/08/19 0700 03/08/19 1839 03/09/19 0900  Weight: 126.6 kg 125.8 kg 125.8 kg    Examination: Gen: Morbidly obese chronically ill-appearing female, more alert answers questions, oriented x3 but somnolent HEENT: Obese, unable to assess JVD Lungs: Distant  breath sounds, diminished at both bases CVS: RRR,No Gallops,Rubs or new Murmurs Abd: soft, Non tender, non distended, BS present, PD catheter noted Extremities: No edema Skin: no new rashes Central nervous system: Somnolent but arousable, moves all extremities, no localizing signs   Data Reviewed: I have personally reviewed following labs and imaging studies  CBC: Recent Labs  Lab 03/02/19 1720 03/03/19 0622 03/04/19 0826 03/05/19 1134 03/06/19 0358 03/08/19 0126 03/09/19 0412  WBC 9.8   < > 9.5 9.9 7.9 10.5 16.5*  NEUTROABS 7.0  --   --   --   --  7.4  --   HGB 12.0   < > 11.3* 11.1* 11.5* 11.6* 11.5*  HCT 38.0   < > 35.8* 34.9* 36.7 37.2 37.2  MCV 100.5*   < > 98.9 99.4 101.4* 100.3* 102.2*  PLT 479*   < > 476* 430* 411* 386 299   < > = values in this interval not displayed.   Basic Metabolic Panel: Recent Labs  Lab 03/02/19 1720 03/03/19 0057 03/03/19 0622 03/04/19 3009 03/07/19 0427 03/08/19 0126 03/09/19 0412  NA   < >  --  141 138 132* 133* 133*  K   < >  --  3.2* 3.7 3.4* 4.5 4.2  CL   < >  --  99 97* 92* 93* 93*  CO2   < >  --  26 23 23  20* 20*  GLUCOSE   < >  --  98 108* 88 117* 149*  BUN   < >  --  49* 49* 45* 46* 47*  CREATININE   < >  --  15.56* 15.43* 15.44* 15.42* 16.11*  CALCIUM   < >  --  8.2* 7.8* 6.8* 6.8* 6.7*  MG  --  1.8  --   --   --   --   --   PHOS  --  6.2*  --   --   --  4.0 5.4*   < > = values in this interval not displayed.   GFR: Estimated Creatinine Clearance: 5.3 mL/min (A) (by C-G formula based on SCr of 16.11 mg/dL (H)). Liver Function Tests: Recent Labs  Lab 03/08/19 0126 03/09/19 0412  AST 33  --   ALT 19  --   ALKPHOS 108  --   BILITOT 0.6  --   PROT 6.5  --   ALBUMIN 2.7* 2.6*   No results for input(s): LIPASE, AMYLASE in the last 168 hours. No results for input(s): AMMONIA in the last 168 hours. Coagulation Profile: No results for input(s): INR, PROTIME in the last 168 hours. Cardiac Enzymes: No results for  input(s): CKTOTAL, CKMB, CKMBINDEX, TROPONINI in the last 168 hours. BNP (last 3 results) No results for input(s): PROBNP in the last 8760 hours. HbA1C: No results for input(s): HGBA1C in the last 72 hours. CBG: Recent Labs  Lab 03/08/19 0820 03/08/19 1249 03/08/19 1626 03/08/19 2128 03/09/19 0726  GLUCAP 106* 108* 107* 119* 144*   Lipid Profile: No results for input(s): CHOL, HDL, LDLCALC, TRIG, CHOLHDL, LDLDIRECT in the last 72 hours. Thyroid Function Tests: No results for input(s): TSH, T4TOTAL, FREET4, T3FREE, THYROIDAB in the last 72 hours. Anemia Panel: No results for input(s): VITAMINB12, FOLATE, FERRITIN, TIBC, IRON, RETICCTPCT in the last 72 hours. Sepsis Labs: Recent Labs  Lab 03/08/19 0126 03/08/19 1022 03/09/19 0412  PROCALCITON  --  6.71 16.09  LATICACIDVEN 1.4  --   --     Recent Results (from the past 240 hour(s))  SARS CORONAVIRUS 2 (TAT 6-24 HRS) Nasopharyngeal Nasopharyngeal Swab     Status: None   Collection Time: 03/02/19 11:47 PM   Specimen: Nasopharyngeal Swab  Result Value Ref Range Status   SARS Coronavirus 2 NEGATIVE NEGATIVE Final    Comment: (NOTE) SARS-CoV-2 target nucleic acids are NOT DETECTED. The SARS-CoV-2 RNA is generally detectable in upper and lower respiratory specimens during the acute phase of infection. Negative results do not preclude SARS-CoV-2 infection, do not rule out co-infections with other pathogens, and should not be used as the sole basis for treatment or other patient management decisions. Negative results must be combined with clinical observations, patient history, and epidemiological information. The expected result is Negative. Fact Sheet for Patients: SugarRoll.be Fact Sheet for Healthcare Providers: https://www.woods-mathews.com/ This test is not yet approved or cleared by the [place] FDA and  has been authorized for detection and/or diagnosis of SARS-CoV-2  by FDA under an Emergency Use Authorization (EUA). This EUA will remain  in effect (meaning this test can be used) for the duration of the COVID-19 declaration under Section 56 4(b)(1) of the Act, 21 U.S.C.  section 360bbb-3(b)(1), unless the authorization is terminated or revoked sooner. Performed at [facility], [place] [address], [place], [place] [address]   Culture, blood (routine x 2)     Status: None (Preliminary result)   Collection Time: 03/08/19  1:15 AM   Specimen: BLOOD  Result Value Ref Range Status   Specimen Description BLOOD RIGHT HAND  Final   Special Requests   Final    BOTTLES DRAWN AEROBIC AND ANAEROBIC Blood Culture adequate volume   Culture  Setup Time   Final    GRAM POSITIVE COCCI IN CLUSTERS ANAEROBIC BOTTLE ONLY CRITICAL RESULT CALLED TO, READ BACK BY AND VERIFIED WITH: Alona Bene PHARMD, AT [place] 03/09/19 BY D.VANHOOK Performed at [facility], [place] [address] [place], [place] [address]    Culture GRAM POSITIVE COCCI  Final   Report Status PENDING  Incomplete  Culture, blood (routine x 2)     Status: None (Preliminary result)   Collection Time: 03/08/19  1:24 AM   Specimen: BLOOD  Result Value Ref Range Status   Specimen Description BLOOD RIGHT ARM  Final   Special Requests   Final    BOTTLES DRAWN AEROBIC AND ANAEROBIC Blood Culture adequate volume   Culture   Final    NO GROWTH 1 DAY Performed at [facility], [place] [address] [place], [place] [address]    Report Status PENDING  Incomplete  Culture, body fluid-bottle     Status: None (Preliminary result)   Collection Time: 03/08/19  8:58 PM   Specimen: Peritoneal Dialysate  Result Value Ref Range Status   Specimen Description PERITONEAL DIALYSATE  Final   Special Requests BOTTLES DRAWN AEROBIC AND ANAEROBIC  Final   Culture   Final    NO GROWTH < 12 HOURS Performed at [facility], [place] [address], [place], [place] [address]    Report Status PENDING  Incomplete  Gram stain      Status: None   Collection Time: 03/08/19  8:58 PM   Specimen: Peritoneal Dialysate  Result Value Ref Range Status   Specimen Description PERITONEAL DIALYSATE  Final   Special Requests NONE  Final   Gram Stain   Final    WBC PRESENT, PREDOMINANTLY MONONUCLEAR NO ORGANISMS SEEN CYTOSPIN SMEAR Performed at [facility], [place] [address] [place], [place] [address]    Report Status 03/08/2019 FINAL  Final         Radiology Studies: DG CHEST PORT 1 VIEW  Result Date: 03/09/2019 CLINICAL DATA:  Fever. EXAM: PORTABLE CHEST 1 VIEW COMPARISON:  03/08/2019. FINDINGS: Stable mild cardiomegaly. Mild bibasilar infiltrates with slightly improved aeration from prior exam. Tiny left pleural effusion cannot be excluded. No pneumothorax. IMPRESSION: 1.  Stable mild cardiomegaly. 2. Mild bibasilar infiltrates with slightly improved aeration from prior exam. Tiny left pleural effusion cannot be excluded. Electronically Signed   By: Marcello Moores  Register   On: 03/09/2019 10:41   DG Chest Port 1 View  Result Date: 03/08/2019 CLINICAL DATA:  Fever, cough. EXAM: PORTABLE CHEST 1 VIEW COMPARISON:  03/02/2019. FINDINGS: Trachea is midline. Heart size is accentuated by AP semi upright technique and low lung volumes. Thoracic aorta is calcified. Mild bibasilar airspace opacification, new from 03/02/2019. No definite pleural fluid. IMPRESSION: 1. New mild bibasilar airspace opacification may be due in part to lower lung volumes than on 03/02/2019. The possibility of pneumonia, including due to COVID-19, is also considered. 2.  Aortic atherosclerosis (ICD10-I70.0). Electronically Signed  By: Lorin Picket M.D.   On: 03/08/2019 09:23        Scheduled Meds: . aspirin EC  81 mg Oral Daily  . calcium acetate  1,334 mg Oral TID WC  . feeding supplement (PRO-STAT SUGAR FREE 64)  30 mL Oral BID  . ferric citrate  420 mg Oral TID WC  . folic acid  1 mg Oral Daily  . gabapentin  100 mg Oral QHS  . gentamicin  cream  1 application Topical Daily  . heparin injection (subcutaneous)  5,000 Units Subcutaneous Q8H  . hydrocortisone   Topical QID  . insulin aspart  0-6 Units Subcutaneous TID WC  . midodrine  10 mg Oral TID WC  . pantoprazole  40 mg Oral BID AC  . rosuvastatin  10 mg Oral Daily  . ticagrelor  90 mg Oral BID  . vancomycin variable dose per unstable renal function (pharmacist dosing)   Does not apply See admin instructions  . vitamin B-12  1,000 mcg Oral Daily   Continuous Infusions: . sodium chloride 10 mL/hr at 03/03/19 0800  . ceFEPime (MAXIPIME) IV       LOS: 6 days    Time spent: 35 mins.More than 50% of that time was spent in counseling and/or coordination of care.   Domenic Polite, MD [facility] P2/18/2021, 11:14 AM

## 2019-03-09 NOTE — Progress Notes (Signed)
Patient's PD machine began to alarm with a drain error. HD RN called and advised to call the 1-800 number on the machine. 1-800 number was called multiple times with no response so HD RN was called again and told this RN their charge nurse would be made aware of the error message.  Elesa Hacker, RN

## 2019-03-09 NOTE — Progress Notes (Signed)
PT Cancellation Note  Patient Details Name: Brenda Davis MRN: [id number] DOB: March 13, 1965   Cancelled Treatment:    Reason Eval/Treat Not Completed: Medical issues which prohibited therapy patient still with fever and very lethargic per RN. Getting CXR today, might also be getting covid test depending on imaging results. Holding PT for today- will continue to follow acutely.   Windell Norfolk, DPT, PN1   Supplemental Physical Therapist [facility]    Pager [phone] Acute Rehab Office [phone]

## 2019-03-09 NOTE — Progress Notes (Signed)
Report called to Jerene Dilling for transfer to [phone]. Pt transferred via pt bed.

## 2019-03-09 NOTE — Progress Notes (Addendum)
Progress Note  Patient Name: Brenda Davis Date of Encounter: 03/09/2019  Primary Cardiologist: Skeet Latch, MD  Subjective   C/o chest pain, points to lower sternal area, unable to rate. Lethargic, confused at times  Inpatient Medications    Scheduled Meds: . aspirin EC  81 mg Oral Daily  . calcium acetate  1,334 mg Oral TID WC  . feeding supplement (PRO-STAT SUGAR FREE 64)  30 mL Oral BID  . ferric citrate  420 mg Oral TID WC  . folic acid  1 mg Oral Daily  . gabapentin  100 mg Oral QHS  . gentamicin cream  1 application Topical Daily  . heparin injection (subcutaneous)  5,000 Units Subcutaneous Q8H  . hydrocortisone   Topical QID  . insulin aspart  0-6 Units Subcutaneous TID WC  . midodrine  10 mg Oral TID WC  . pantoprazole  40 mg Oral BID AC  . rosuvastatin  10 mg Oral Daily  . ticagrelor  90 mg Oral BID  . vancomycin variable dose per unstable renal function (pharmacist dosing)   Does not apply See admin instructions  . vitamin B-12  1,000 mcg Oral Daily   Continuous Infusions: . sodium chloride 10 mL/hr at 03/03/19 0800  . ceFEPime (MAXIPIME) IV     PRN Meds: acetaminophen, diclofenac Sodium, gentamicin cream, dianeal solution for CAPD/CCPD with heparin, dianeal solution for CAPD/CCPD with heparin, HYDROcodone-acetaminophen, morphine injection, nitroGLYCERIN, ondansetron (ZOFRAN) IV   Vital Signs    Vitals:   03/09/19 0340 03/09/19 0443 03/09/19 0750 03/09/19 0751  BP: (!) 83/45 127/82 (!) 88/50   Pulse:  (!) 103  (!) 103  Resp:  17 16   Temp:   (!) 102.9 F (39.4 C) (!) 102.4 F (39.1 C)  TempSrc:   Oral Oral  SpO2:  93%  96%  Weight:      Height:        Intake/Output Summary (Last 24 hours) at 03/09/2019 4235 Last data filed at 03/08/2019 1512 Gross per 24 hour  Intake 1345.83 ml  Output --  Net 1345.83 ml   Last 3 Weights 03/08/2019 03/08/2019 03/07/2019  Weight (lbs) 277 lb 5.4 oz 279 lb 1.6 oz 276 lb 10.8 oz  Weight (kg) 125.8  kg 126.6 kg 125.5 kg     Telemetry    SR, ST - Personally Reviewed  Physical Exam   General: Well developed, well nourished, female in no acute distress Head: Eyes PERRLA, Head normocephalic and atraumatic Lungs: few rales bases bilaterally to auscultation. Heart: HRRR S1 S2, without rub or gallop. No murmur. 4/4 extremity pulses are 2+ & equal. No JVD. Abdomen: Bowel sounds are present, abdomen soft and non-tender without masses or  hernias noted. Msk: Weak strength and tone for age. Extremities: No clubbing, cyanosis or edema.    Skin:  No rashes or lesions noted. Very warm Neuro: Awake and oriented X 2.   Labs    High Sensitivity Troponin:   Recent Labs  Lab 03/02/19 1720 03/02/19 1920 03/05/19 1134 03/06/19 1011 03/07/19 0427  TROPONINIHS 119* 132* 929* 2,454* 2,970*      Chemistry Recent Labs  Lab 03/07/19 0427 03/08/19 0126 03/09/19 0412  NA 132* 133* 133*  K 3.4* 4.5 4.2  CL 92* 93* 93*  CO2 23 20* 20*  GLUCOSE 88 117* 149*  BUN 45* 46* 47*  CREATININE 15.44* 15.42* 16.11*  CALCIUM 6.8* 6.8* 6.7*  PROT  --  6.5  --   ALBUMIN  --  2.7* 2.6*  AST  --  33  --   ALT  --  19  --   ALKPHOS  --  108  --   BILITOT  --  0.6  --   GFRNONAA 2* 2* 2*  GFRAA 3* 3* 3*  ANIONGAP 17* 20* 20*     Hematology Recent Labs  Lab 03/06/19 0358 03/08/19 0126 03/09/19 0412  WBC 7.9 10.5 16.5*  RBC 3.62* 3.71* 3.64*  HGB 11.5* 11.6* 11.5*  HCT 36.7 37.2 37.2  MCV 101.4* 100.3* 102.2*  MCH 31.8 31.3 31.6  MCHC 31.3 31.2 30.9  RDW 16.5* 16.9* 17.0*  PLT 411* 386 299    Radiology    DG Chest Port 1 View  Result Date: 03/08/2019 CLINICAL DATA:  Fever, cough. EXAM: PORTABLE CHEST 1 VIEW COMPARISON:  03/02/2019. FINDINGS: Trachea is midline. Heart size is accentuated by AP semi upright technique and low lung volumes. Thoracic aorta is calcified. Mild bibasilar airspace opacification, new from 03/02/2019. No definite pleural fluid. IMPRESSION: 1. New mild  bibasilar airspace opacification may be due in part to lower lung volumes than on 03/02/2019. The possibility of pneumonia, including due to COVID-19, is also considered. 2.  Aortic atherosclerosis (ICD10-I70.0). Electronically Signed   By: Lorin Picket M.D.   On: 03/08/2019 09:23    Cardiac Studies   Cardiac Cath 03/03/19  Mid LAD lesion is 75% stenosed.  Ost LAD to Mid LAD lesion is 40% stenosed.  Previously placed Ost 1st Mrg drug eluting stent is widely patent.  Mid Cx to Dist Cx lesion is 55% stenosed with 40% stenosed side branch in Ost 3rd Mrg.  Previously placed Prox RCA drug eluting stent is widely patent.  Ost 1st Mrg to 1st Mrg lesion is 99% stenosed. Partially in the stent and partially distal to the stent- subacte stent thrombosis.  LV end diastolic pressure is normal.  There is no aortic valve stenosis. Medical therapy for stent thrombosis of the OM stent.  Given the severe tortuosity, I don't think repeat PCI attempt would be beneficial.  There was significant difficulty getting a stent delivered during the last cath.  I think there would be a high likelihood of completely occluding the vessel with wire manipulation.  Diagnostic Dominance: Right      Patient Profile     54 y.o. female with ESRD on PD, multivessel CAD s/p DES to OM1 and prox RCA 37/4827 (complicated by RPH/ABL anemia and shock), HTN, HLD, DM II, cirrhosis, chronic diastolic CHF, mild anemia presented with chest pain and NSTEMI. Peak hsTroponin 2970. [place] 03/03/19 showed stent thrombosis of OM stent, with recommendation to manage medically.  Assessment & Plan    1. CAD/NSTEMI -  - on ASA, Brilinta, statin - BP limits other rx, did not tolerate isordil - with SBP 80s, unable to give nitro or pain meds  2. Essential HTN  - probs w/ hypotension, on midodrine  3. Hyperlipidemia -  - continue Crestor - hx abnl LFTs on Lipitor  4. ESRD on PD   - Nephrology managing  5. Chronic diastolic CHF    - volume mgt per Nephrology   6. Sepsis - temps to 103, procalcitonin and WBCs trending up - PD fluid cultured, pending. - no organisms seen on gram stain - ABX per IM  For questions or updates, please contact [facility] Please consult [email or web address] for contact info under Cardiology/STEMI.  Signed, Rosaria Ferries, PA-C 03/09/2019, 8:21 AM    Dr Broadus John is updating pt  husband on general medical condition, but she requests Cards call him as well and help him understand her cardiac prognosis.  Rosaria Ferries, PA-C 03/09/2019 9:06 AM

## 2019-03-09 NOTE — Progress Notes (Signed)
[facility] Progress Note   Subjective:  Seen in room - feeling a little better today. COVID test repeated, still pending. Afebrile today. No abd pain - no PD issues.  Objective Vitals:   03/09/19 0900 03/09/19 0939 03/09/19 1300 03/09/19 1418  BP: (!) 92/49 (!) 106/46  94/61  Pulse: (!) 102  83 84  Resp:  17 18   Temp: (!) 101.8 F (38.8 C)   98.9 F (37.2 C)  TempSrc: Oral   Oral  SpO2: 100%  93%   Weight: 125.8 kg     Height:       Physical Exam General: NAD, speaking on phone Heart: RRR; no murmur Lungs: CTAB; no rales Abdomen: soft, non-tender Extremities: No LE edema Dialysis Access:  PD cath   Additional Objective Labs: Basic Metabolic Panel: Recent Labs  Lab 03/03/19 0057 03/03/19 0622 03/07/19 0427 03/08/19 0126 03/09/19 0412  NA  --    < > 132* 133* 133*  K  --    < > 3.4* 4.5 4.2  CL  --    < > 92* 93* 93*  CO2  --    < > 23 20* 20*  GLUCOSE  --    < > 88 117* 149*  BUN  --    < > 45* 46* 47*  CREATININE  --    < > 15.44* 15.42* 16.11*  CALCIUM  --    < > 6.8* 6.8* 6.7*  PHOS 6.2*  --   --  4.0 5.4*   < > = values in this interval not displayed.   Liver Function Tests: Recent Labs  Lab 03/08/19 0126 03/09/19 0412  AST 33  --   ALT 19  --   ALKPHOS 108  --   BILITOT 0.6  --   PROT 6.5  --   ALBUMIN 2.7* 2.6*   CBC: Recent Labs  Lab 03/02/19 1720 03/03/19 0622 03/04/19 0826 03/04/19 0826 03/05/19 1134 03/05/19 1134 03/06/19 0358 03/08/19 0126 03/09/19 0412  WBC 9.8   < > 9.5   < > 9.9   < > 7.9 10.5 16.5*  NEUTROABS 7.0  --   --   --   --   --   --  7.4  --   HGB 12.0   < > 11.3*   < > 11.1*   < > 11.5* 11.6* 11.5*  HCT 38.0   < > 35.8*   < > 34.9*   < > 36.7 37.2 37.2  MCV 100.5*   < > 98.9  --  99.4  --  101.4* 100.3* 102.2*  PLT 479*   < > 476*   < > 430*   < > 411* 386 299   < > = values in this interval not displayed.   Blood Culture    Component Value Date/Time   SDES PERITONEAL DIALYSATE 03/08/2019  2058   SDES PERITONEAL DIALYSATE 03/08/2019 2058   [place] AND ANAEROBIC 03/08/2019 2058   [place] NONE 03/08/2019 2058   CULT  03/08/2019 2058    NO GROWTH < 12 HOURS Performed at [facility] [address] [place], [place] [address]    REPTSTATUS PENDING 03/08/2019 4268   REPTSTATUS 03/08/2019 FINAL 03/08/2019 2058   Studies/Results: DG CHEST PORT 1 VIEW  Result Date: 03/09/2019 CLINICAL DATA:  Fever. EXAM: PORTABLE CHEST 1 VIEW COMPARISON:  03/08/2019. FINDINGS: Stable mild cardiomegaly. Mild bibasilar infiltrates with slightly improved aeration from prior exam. Tiny  left pleural effusion cannot be excluded. No pneumothorax. IMPRESSION: 1.  Stable mild cardiomegaly. 2. Mild bibasilar infiltrates with slightly improved aeration from prior exam. Tiny left pleural effusion cannot be excluded. Electronically Signed   By: Marcello Moores  Register   On: 03/09/2019 10:41   DG Chest Port 1 View  Result Date: 03/08/2019 CLINICAL DATA:  Fever, cough. EXAM: PORTABLE CHEST 1 VIEW COMPARISON:  03/02/2019. FINDINGS: Trachea is midline. Heart size is accentuated by AP semi upright technique and low lung volumes. Thoracic aorta is calcified. Mild bibasilar airspace opacification, new from 03/02/2019. No definite pleural fluid. IMPRESSION: 1. New mild bibasilar airspace opacification may be due in part to lower lung volumes than on 03/02/2019. The possibility of pneumonia, including due to COVID-19, is also considered. 2.  Aortic atherosclerosis (ICD10-I70.0). Electronically Signed   By: Lorin Picket M.D.   On: 03/08/2019 09:23   Medications: . sodium chloride 10 mL/hr at 03/03/19 0800  . ceFEPime (MAXIPIME) IV     . aspirin EC  81 mg Oral Daily  . calcium acetate  1,334 mg Oral TID WC  . feeding supplement (PRO-STAT SUGAR FREE 64)  30 mL Oral BID  . ferric citrate  420 mg Oral TID WC  . folic acid  1 mg Oral Daily  . gabapentin  100 mg Oral QHS  . gentamicin cream   1 application Topical Daily  . heparin injection (subcutaneous)  5,000 Units Subcutaneous Q8H  . hydrocortisone   Topical QID  . insulin aspart  0-6 Units Subcutaneous TID WC  . midodrine  10 mg Oral TID WC  . pantoprazole  40 mg Oral BID AC  . rosuvastatin  10 mg Oral Daily  . ticagrelor  90 mg Oral BID  . vancomycin variable dose per unstable renal function (pharmacist dosing)   Does not apply See admin instructions  . vitamin B-12  1,000 mcg Oral Daily    Dialysis Orders: CCPD 6 exchanges 2.7Lfill.1.5h dwell No day dwell.(Patient says she has been doing 5 exchanges)  Assessment/Plan: 1. CAD/Unstable angina. Known multivessel disease. LHC 2/12 with progressive disease, occlusion of recent OM stent. Cardiology following - medical therapy for now- started on isosorbidewhich has helped CP but leading to hypotension -> midodrine 66m BID added, now titrated to 114mTID, follow.On aspirin/Brilinta. 2. Sepsis/bacteremia: Febrile overnight 2/16. No signs of peritonitis, but studies ordered -> cell ct 19, Cx pending. CXR with new bibasilar opacities, ?pna. BCx drawn -> now 1 of 2 growing GPC, started on Vanc/Cefepime. 3. ESRD: Continue CCPD- changed to all 1.5% fluids as of 2/15 - follow. 4. Hypokalemia: Improving. 5. Hypertension/volume: See above. On isosorbide + mido now. 6. Anemia - Hgb 11.5. No ESA needs. Follow trends. 7. Metabolic bone disease: Caremains low - ^ Phoslo to 2/meals and sensipar held. Will start calcitriol 0.2577mPO q day. 8. Nutrition:Continuepro-stat supplements while here. 9. GERD: on PPI  KatVeneta PentonA-C 03/09/2019, 3:38 PM  [facility] Pager: [phone]

## 2019-03-09 NOTE — Progress Notes (Signed)
Notified by lab of Positive Covid test result. Dr Broadus John and Lauretta Chester, PA notified. Plan to transfer to 5W when bed available. Pt's husband notified.

## 2019-03-09 NOTE — Progress Notes (Signed)
Temp = 102.4. APAP 650 mg po given. Unable to send for CXR at this time as pt is still on CAPD for another 1.5 hrs. Dr Broadus John notified.

## 2019-03-10 ENCOUNTER — Ambulatory Visit: Payer: BC Managed Care – PPO

## 2019-03-10 DIAGNOSIS — J1282 Pneumonia due to coronavirus disease 2019: Secondary | ICD-10-CM

## 2019-03-10 DIAGNOSIS — U071 COVID-19: Secondary | ICD-10-CM

## 2019-03-10 LAB — COMPREHENSIVE METABOLIC PANEL
ALT: 15 U/L (ref 0–44)
AST: 23 U/L (ref 15–41)
Albumin: 2.3 g/dL — ABNORMAL LOW (ref 3.5–5.0)
Alkaline Phosphatase: 97 U/L (ref 38–126)
Anion gap: 19 — ABNORMAL HIGH (ref 5–15)
BUN: 50 mg/dL — ABNORMAL HIGH (ref 6–20)
CO2: 20 mmol/L — ABNORMAL LOW (ref 22–32)
Calcium: 7 mg/dL — ABNORMAL LOW (ref 8.9–10.3)
Chloride: 94 mmol/L — ABNORMAL LOW (ref 98–111)
Creatinine, Ser: 17.06 mg/dL — ABNORMAL HIGH (ref 0.44–1.00)
GFR calc Af Amer: 2 mL/min — ABNORMAL LOW (ref >60)
GFR calc non Af Amer: 2 mL/min — ABNORMAL LOW (ref >60)
Glucose, Bld: 188 mg/dL — ABNORMAL HIGH (ref 70–99)
Potassium: 4.6 mmol/L (ref 3.5–5.1)
Sodium: 133 mmol/L — ABNORMAL LOW (ref 135–145)
Total Bilirubin: 0.6 mg/dL (ref 0.3–1.2)
Total Protein: 6.4 g/dL — ABNORMAL LOW (ref 6.5–8.1)

## 2019-03-10 LAB — CBC
HCT: 35.5 % — ABNORMAL LOW (ref 36.0–46.0)
Hemoglobin: 11 g/dL — ABNORMAL LOW (ref 12.0–15.0)
MCH: 31.3 pg (ref 26.0–34.0)
MCHC: 31 g/dL (ref 30.0–36.0)
MCV: 101.1 fL — ABNORMAL HIGH (ref 80.0–100.0)
Platelets: 273 10*3/uL (ref 150–400)
RBC: 3.51 MIL/uL — ABNORMAL LOW (ref 3.87–5.11)
RDW: 17 % — ABNORMAL HIGH (ref 11.5–15.5)
WBC: 11 10*3/uL — ABNORMAL HIGH (ref 4.0–10.5)
nRBC: 0.5 % — ABNORMAL HIGH (ref 0.0–0.2)

## 2019-03-10 LAB — MRSA PCR SCREENING: MRSA by PCR: NEGATIVE

## 2019-03-10 LAB — GLUCOSE, CAPILLARY
Glucose-Capillary: 201 mg/dL — ABNORMAL HIGH (ref 70–99)
Glucose-Capillary: 172 mg/dL — ABNORMAL HIGH (ref 70–99)
Glucose-Capillary: 134 mg/dL — ABNORMAL HIGH (ref 70–99)
Glucose-Capillary: 177 mg/dL — ABNORMAL HIGH (ref 70–99)

## 2019-03-10 LAB — D-DIMER, QUANTITATIVE: D-Dimer, Quant: 2.7 ug/mL-FEU — ABNORMAL HIGH (ref 0.00–0.50)

## 2019-03-10 LAB — PATHOLOGIST SMEAR REVIEW

## 2019-03-10 LAB — PROCALCITONIN: Procalcitonin: 21.54 ng/mL

## 2019-03-10 LAB — C-REACTIVE PROTEIN: CRP: 29.3 mg/dL — ABNORMAL HIGH (ref <1.0)

## 2019-03-10 LAB — FERRITIN: Ferritin: 3364 ng/mL — ABNORMAL HIGH (ref 11–307)

## 2019-03-10 MED ORDER — ISOSORBIDE DINITRATE 5 MG PO TABS
5.0000 mg | ORAL_TABLET | Freq: Two times a day (BID) | ORAL | Status: DC
  Administered 2019-03-10 (×2): 5 mg via ORAL
  Filled 2019-03-10 (×3): qty 1

## 2019-03-10 MED ORDER — MIDODRINE HCL 5 MG PO TABS
5.0000 mg | ORAL_TABLET | Freq: Three times a day (TID) | ORAL | Status: DC
  Administered 2019-03-10 – 2019-03-11 (×2): 5 mg via ORAL
  Filled 2019-03-10 (×2): qty 1

## 2019-03-10 NOTE — Progress Notes (Signed)
Physical Therapy Treatment Patient Details Name: Brenda Davis MRN: [id number] DOB: 05-May-1965 Today's Date: 03/10/2019    History of Present Illness Patient is a 37 old female with history of end-stage renal disease on peritoneal dialysis, coronary artery disease status post multiple PCIs, chronic diastolic heart failure, diabetes, hyperlipidemia who presents to the emergency department on February 11 with complaints of chest pain.  Work-up revealed elevated troponin concerning for NSTEMI.  Cardiology consulted.  She underwent cardiac cath with plans for medical management. 03/09/19 diagnosed COVID+    PT Comments    Pt sitting EoB on entry, agreeable to getting up to go to the bathroom. Increased time required to process questions and answer, and to perform tasks. Pt is limited in safe mobility by increased lethargy, and decreased cognition in presence of generalized weakness and decreased balance. Pt is currently min A for sit>stand and min guard for ambulation of 20 feet with RW. D/c plans are most likely still appropriate, however will reassess when/if processing improves. PT will continue to follow acutely.    Follow Up Recommendations  No PT follow up;Other (comment)(may have to reassess if cognition does not improve)     Equipment Recommendations  None recommended by PT       Precautions / Restrictions Precautions Precautions: Other (comment);Fall Precaution Comments: watch BP    Mobility  Bed Mobility               General bed mobility comments: sitting up on EOB  Transfers Overall transfer level: Needs assistance Equipment used: Rolling walker (2 wheeled);None Transfers: Sit to/from Stand Sit to Stand: Min assist;+2 physical assistance;Supervision         General transfer comment: minAx2 for initial sit>stand from EoB, supervision for standing from [facility] over toilet with use of handrails  Ambulation/Gait Ambulation/Gait assistance: Min guard;+2  safety/equipment Gait Distance (Feet): 20 Feet Assistive device: Rolling walker (2 wheeled) Gait Pattern/deviations: Step-through pattern;Decreased stride length Gait velocity: slowed Gait velocity interpretation: <1.8 ft/sec, indicate of risk for recurrent falls General Gait Details: min guard for safety with mildly unsteady gait, no overt LOB, vc for RW management        Balance Overall balance assessment: Needs assistance   Sitting balance-Leahy Scale: Good     Standing balance support: Single extremity supported Standing balance-Leahy Scale: Poor                              Cognition Arousal/Alertness: Lethargic Behavior During Therapy: Flat affect Overall Cognitive Status: Impaired/Different from baseline Area of Impairment: Attention;Following commands;Safety/judgement;Awareness;Problem solving                   Current Attention Level: Selective   Following Commands: Follows one step commands with increased time;Follows multi-step commands with increased time;Follows multi-step commands inconsistently;Follows one step commands consistently Safety/Judgement: Decreased awareness of deficits Awareness: Emergent Problem Solving: Slow processing;Decreased initiation;Difficulty sequencing;Requires verbal cues;Requires tactile cues           General Comments General comments (skin integrity, edema, etc.): BP at rest 192/125 after mobility 137/107, HR max with ambulation 122 bpm       Pertinent Vitals/Pain Pain Assessment: Faces Faces Pain Scale: Hurts a little bit Pain Location: generalized  Pain Descriptors / Indicators: Grimacing Pain Intervention(s): Limited activity within patient's tolerance;Monitored during session;Repositioned           PT Goals (current goals can now be found in the care plan section) Acute  Rehab PT Goals Patient Stated Goal: to go home PT Goal Formulation: With patient/family Time For Goal Achievement:  03/13/19 Potential to Achieve Goals: Good Progress towards PT goals: Not progressing toward goals - comment(limited by increased lethargy)    Frequency    Min 3X/week      PT Plan Current plan remains appropriate       AM-PAC PT "6 Clicks" Mobility   Outcome Measure  Help needed turning from your back to your side while in a flat bed without using bedrails?: None Help needed moving from lying on your back to sitting on the side of a flat bed without using bedrails?: None Help needed moving to and from a bed to a chair (including a wheelchair)?: A Little Help needed standing up from a chair using your arms (e.g., wheelchair or bedside chair)?: A Little Help needed to walk in hospital room?: A Little Help needed climbing 3-5 steps with a railing? : A Lot 6 Click Score: 19    End of Session Equipment Utilized During Treatment: Gait belt Activity Tolerance: Patient limited by lethargy Patient left: with call bell/phone within reach;in chair Nurse Communication: Mobility status;Other (comment)(pts decreased cognition) PT Visit Diagnosis: Difficulty in walking, not elsewhere classified (R26.2)     Time: [id number] PT Time Calculation (min) (ACUTE ONLY): 39 min  Charges:  $Gait Training: 8-22 mins $Therapeutic Activity: 8-22 mins                     Bryce Cheever B. Migdalia Dk PT, DPT Acute Rehabilitation Services Pager [phone] Office [phone]    [place] 03/10/2019, 5:17 PM

## 2019-03-10 NOTE — Progress Notes (Signed)
Notified [place], NP that pt's QTC on telemetry is 552. Pt is asleep. Will continue to monitor pt. Ranelle Oyster , RN

## 2019-03-10 NOTE — Progress Notes (Signed)
PROGRESS NOTE                                                                                                                                                                                                             Patient Demographics:    Brenda Davis, is a 54 y.o. female, DOB - 1965-09-10, JQG:[id number]  Outpatient Primary MD for the patient is [name], MD   Admit date - 03/02/2019   LOS - 7  Chief Complaint  Patient presents with   Chest Pain       Brief Narrative: Patient is a 54 y.o. female with PMHx of ESRD on PD, CAD s/p PCI, chronic diastolic heart failure, DM, HLD who presented to the ED on 2/11 with chest pain-found to have non-STEMI-underwent LHC-Per cardiology due to tortuosity and location of the vessel-not amenable for PCI-and medical management recommended.  Hospital course complicated by hypotension, and fever-subsequently found to have COVID-19.  See below for further details.   Subjective:    Brenda Davis today denies any chest pain-she was sitting up in bed.  Afebrile overnight.   Assessment  & Plan :   Non-STEMI-prior history of CAD s/p PCI 01/17/2019: Currently chest pain-free-hypotension limiting use of nitrates and beta-blockers.  Per cardiology-due to location/tortuosity of culprit lesion-not a candidate for PCI-medical management recommended.  Continue aspirin/statin/Brilinta-since BP now somewhat stable with initiation of midodrine-we will try low-dose nitrates.  Sepsis secondary to COVID-19 pneumonia and concurrent bacterial pneumonia: Sepsis physiology improving-on room air this morning-fever over the past few days likely from COVID-19 pneumonia.  Blood cultures on 2/17-positive (1 set) for gram-positive cocci, peritoneal dialysate cultures on 2/17 neg as well.  Procalcitonin is still significantly elevated continue  steroids/remdesivir/vancomycin/cefepime.  Fever: afebrile this morning  O2 requirements:  SpO2: 92 % O2 Flow Rate (L/min): 2 L/min   COVID-19 Labs: Recent Labs    03/08/19 1022 03/10/19 0303  DDIMER  --  2.70*  FERRITIN  --  3,364*  CRP 10.6* 29.3*       Component Value Date/Time   BNP 110.2 (H) 06/18/2018 2314    Recent Labs  Lab 03/08/19 1022 03/09/19 0412 03/10/19 0303  PROCALCITON 6.71 16.09 21.54    Lab Results  Component Value Date   SARSCOV2NAA POSITIVE (A) 03/09/2019   [place] NEGATIVE 03/02/2019   [place] NEGATIVE 01/15/2019  [place] NEGATIVE 06/18/2018     COVID-19 Medications: Steroids: 2/18>> Remdesivir: 2/18>>  Antibiotics: Vancomycin:2/17>> Cefepime: 2/17>>  Prone/Incentive Spirometry: encouraged  incentive spirometry use 3-4/hour.  DVT Prophylaxis  :   Heparin   ESRD on peritoneal dialysis: Nephrology following and directing care  Hypotension: BP is stable-continue midodrine  Chronic diastolic heart failure: Stable volume status-diuresis with peritoneal dialysis.  DM-2 (A1c 7.21 December 2018): CBG stable-continue SSI.  Resume Metformin on discharge.  Recent Labs    03/09/19 1649 03/09/19 1953 03/10/19 0808  GLUCAP 103* 98 201*   GERD: Continue PPI  Morbid Obesity: Estimated body mass index is 46.15 kg/m as calculated from the following:   Height as of this encounter: 5' 5"  (1.651 m).   Weight as of this encounter: 125.8 kg.    Consults  : Nephrology/cardiology  Procedures  :   Left heart catheterization on 2/12  ABG:    Component Value Date/Time   PHART 7.307 (L) 01/18/2019 2358   PCO2ART 50.4 (H) 01/18/2019 2358   PO2ART 84.0 01/18/2019 2358   HCO3 25.3 01/18/2019 2358   TCO2 27 01/18/2019 2358   ACIDBASEDEF 1.0 01/18/2019 2358   O2SAT 95.0 01/18/2019 2358    Vent Settings: N/A  Condition - Extremely Guarded  Family Communication  :  Spouse updated over the phone 2/19  Code Status :  Full  Code  Diet :  Diet Order            Diet heart healthy/carb modified Room service appropriate? Yes; Fluid consistency: Thin  Diet effective now               Disposition Plan  :  Remain hospitalized  Barriers to discharge: Complete 5 days of IV Remdesivir/IV antibiotics  Antimicorbials  :    Anti-infectives (From admission, onward)   Start     Dose/Rate Route Frequency Ordered Stop   03/10/19 1000  remdesivir 100 mg in sodium chloride 0.9 % 100 mL IVPB     100 mg 200 mL/hr over 30 Minutes Intravenous Daily 03/09/19 1743 03/14/19 0959   03/09/19 1830  remdesivir 200 mg in sodium chloride 0.9% 250 mL IVPB     200 mg 580 mL/hr over 30 Minutes Intravenous Once 03/09/19 1743 03/09/19 2111   03/09/19 1800  ceFEPIme (MAXIPIME) 1 g in sodium chloride 0.9 % 100 mL IVPB     1 g 200 mL/hr over 30 Minutes Intravenous Every 24 hours 03/08/19 1122     03/08/19 1600  vancomycin (VANCOCIN) 2,500 mg in sodium chloride 0.9 % 500 mL IVPB     2,500 mg 250 mL/hr over 120 Minutes Intravenous  Once 03/08/19 1252 03/08/19 1712   03/08/19 1400  ceFEPIme (MAXIPIME) 2 g in sodium chloride 0.9 % 100 mL IVPB     2 g 200 mL/hr over 30 Minutes Intravenous  Once 03/08/19 1122 03/08/19 1654   03/08/19 1030  vancomycin (VANCOCIN) 2,500 mg in sodium chloride 0.9 % 500 mL IVPB  Status:  Discontinued     2,500 mg 250 mL/hr over 120 Minutes Intravenous  Once 03/08/19 0932 03/08/19 1510   03/08/19 0931  vancomycin variable dose per unstable renal function (pharmacist dosing)      Does not apply See admin instructions 03/08/19 0932     03/08/19 0930  cefTRIAXone (ROCEPHIN) 1 g in sodium chloride 0.9 % 100 mL IVPB  Status:  Discontinued     1 g 200 mL/hr over 30 Minutes Intravenous  Once 03/08/19 0917 03/08/19 1126  Inpatient Medications  Scheduled Meds:  aspirin EC  81 mg Oral Daily   calcitRIOL  0.25 mcg Oral Daily   calcium acetate  1,334 mg Oral TID WC   dexamethasone (DECADRON) injection  6  mg Intravenous q1800   feeding supplement (PRO-STAT SUGAR FREE 64)  30 mL Oral BID   ferric citrate  420 mg Oral TID WC   folic acid  1 mg Oral Daily   gabapentin  100 mg Oral QHS   gentamicin cream  1 application Topical Daily   heparin injection (subcutaneous)  5,000 Units Subcutaneous Q8H   hydrocortisone   Topical QID   insulin aspart  0-6 Units Subcutaneous TID WC   midodrine  10 mg Oral TID WC   pantoprazole  40 mg Oral BID AC   rosuvastatin  10 mg Oral Daily   ticagrelor  90 mg Oral BID   vancomycin variable dose per unstable renal function (pharmacist dosing)   Does not apply See admin instructions   vitamin B-12  1,000 mcg Oral Daily   Continuous Infusions:  sodium chloride 10 mL/hr at 03/03/19 0800   ceFEPime (MAXIPIME) IV 1 g (03/09/19 1821)   remdesivir 100 mg in NS 100 mL 100 mg (03/10/19 0944)   PRN Meds:.acetaminophen, diclofenac Sodium, gentamicin cream, dianeal solution for CAPD/CCPD with heparin, dianeal solution for CAPD/CCPD with heparin, HYDROcodone-acetaminophen, morphine injection, nitroGLYCERIN, ondansetron (ZOFRAN) IV   Time Spent in minutes  35   See all Orders from today for further details   Oren Binet M.D on 03/10/2019 at 10:33 AM  To page go to [email or web address] - use universal password  [facility] -  Office  [phone]    Objective:   Vitals:   03/09/19 1956 03/09/19 2314 03/10/19 0400 03/10/19 0800  BP: (!) 100/49 (!) 129/54 (!) 151/93 139/88  Pulse: 78 69 63 62  Resp: 18 14 13 19   Temp: 99.3 F (37.4 C) 97.9 F (36.6 C) 98.6 F (37 C)   TempSrc: Oral Oral Oral   SpO2: 94% 95% 91% 92%  Weight:      Height:        Wt Readings from Last 3 Encounters:  03/09/19 125.8 kg  02/17/19 125.6 kg  02/11/19 127.9 kg     Intake/Output Summary (Last 24 hours) at 03/10/2019 1033 Last data filed at 03/09/2019 2200 Gross per 24 hour  Intake 350 ml  Output --  Net 350 ml     Physical Exam Gen Exam:Alert  awake-not in any distress HEENT:atraumatic, normocephalic Chest: B/L clear to auscultation anteriorly CVS:S1S2 regular Abdomen:soft non tender, non distended Extremities:no edema Neurology: Non focal Skin: no rash   Data Review:    CBC Recent Labs  Lab 03/05/19 1134 03/06/19 0358 03/08/19 0126 03/09/19 0412 03/10/19 0303  WBC 9.9 7.9 10.5 16.5* 11.0*  HGB 11.1* 11.5* 11.6* 11.5* 11.0*  HCT 34.9* 36.7 37.2 37.2 35.5*  PLT 430* 411* 386 299 273  MCV 99.4 101.4* 100.3* 102.2* 101.1*  MCH 31.6 31.8 31.3 31.6 31.3  MCHC 31.8 31.3 31.2 30.9 31.0  RDW 16.3* 16.5* 16.9* 17.0* 17.0*  LYMPHSABS  --   --  1.1  --   --   MONOABS  --   --  1.6*  --   --   EOSABS  --   --  0.1  --   --   BASOSABS  --   --  0.0  --   --     Chemistries  Recent Labs  Lab 03/04/19 0826 03/07/19 0427 03/08/19 0126 03/09/19 0412 03/10/19 0303  NA 138 132* 133* 133* 133*  K 3.7 3.4* 4.5 4.2 4.6  CL 97* 92* 93* 93* 94*  CO2 23 23 20* 20* 20*  GLUCOSE 108* 88 117* 149* 188*  BUN 49* 45* 46* 47* 50*  CREATININE 15.43* 15.44* 15.42* 16.11* 17.06*  CALCIUM 7.8* 6.8* 6.8* 6.7* 7.0*  AST  --   --  33  --  23  ALT  --   --  19  --  15  ALKPHOS  --   --  108  --  97  BILITOT  --   --  0.6  --  0.6   ------------------------------------------------------------------------------------------------------------------ No results for input(s): CHOL, HDL, LDLCALC, TRIG, CHOLHDL, LDLDIRECT in the last 72 hours.  Lab Results  Component Value Date   HGBA1C 7.2 (H) 01/17/2019   ------------------------------------------------------------------------------------------------------------------ No results for input(s): TSH, T4TOTAL, T3FREE, THYROIDAB in the last 72 hours.  Invalid input(s): FREET3 ------------------------------------------------------------------------------------------------------------------ Recent Labs    03/10/19 0303  FERRITIN 3,364*    Coagulation profile No results for input(s):  INR, PROTIME in the last 168 hours.  Recent Labs    03/10/19 0303  DDIMER 2.70*    Cardiac Enzymes No results for input(s): CKMB, TROPONINI, MYOGLOBIN in the last 168 hours.  Invalid input(s): CK ------------------------------------------------------------------------------------------------------------------    Component Value Date/Time   BNP 110.2 (H) 06/18/2018 2314    Micro Results Recent Results (from the past 240 hour(s))  SARS CORONAVIRUS 2 (TAT 6-24 HRS) Nasopharyngeal Nasopharyngeal Swab     Status: None   Collection Time: 03/02/19 11:47 PM   Specimen: Nasopharyngeal Swab  Result Value Ref Range Status   SARS Coronavirus 2 NEGATIVE NEGATIVE Final    Comment: (NOTE) SARS-CoV-2 target nucleic acids are NOT DETECTED. The SARS-CoV-2 RNA is generally detectable in upper and lower respiratory specimens during the acute phase of infection. Negative results do not preclude SARS-CoV-2 infection, do not rule out co-infections with other pathogens, and should not be used as the sole basis for treatment or other patient management decisions. Negative results must be combined with clinical observations, patient history, and epidemiological information. The expected result is Negative. Fact Sheet for Patients: SugarRoll.be Fact Sheet for Healthcare Providers: https://www.woods-mathews.com/ This test is not yet approved or cleared by the [place] FDA and  has been authorized for detection and/or diagnosis of SARS-CoV-2 by FDA under an Emergency Use Authorization (EUA). This EUA will remain  in effect (meaning this test can be used) for the duration of the COVID-19 declaration under Section 56 4(b)(1) of the Act, 21 U.S.C. section 360bbb-3(b)(1), unless the authorization is terminated or revoked sooner. Performed at [facility], [place] [address] [place], [place] [address]   Culture, blood (routine x 2)     Status: None  (Preliminary result)   Collection Time: 03/08/19  1:15 AM   Specimen: BLOOD  Result Value Ref Range Status   Specimen Description BLOOD RIGHT HAND  Final   Special Requests   Final    BOTTLES DRAWN AEROBIC AND ANAEROBIC Blood Culture adequate volume   Culture  Setup Time   Final    GRAM POSITIVE COCCI IN CLUSTERS ANAEROBIC BOTTLE ONLY CRITICAL RESULT CALLED TO, READ BACK BY AND VERIFIED WITH: VPamalee Leyden PHARMD, AT 0559 03/09/19 BY D.VANHOOK    Culture   Final    GRAM POSITIVE COCCI IDENTIFICATION TO FOLLOW Performed at [facility], [place] [address] [place], [place]  [address]    Report Status PENDING  Incomplete  Culture, blood (routine x 2)     Status: None (Preliminary result)   Collection Time: 03/08/19  1:24 AM   Specimen: BLOOD  Result Value Ref Range Status   Specimen Description BLOOD RIGHT ARM  Final   Special Requests   Final    BOTTLES DRAWN AEROBIC AND ANAEROBIC Blood Culture adequate volume   Culture   Final    NO GROWTH 2 DAYS Performed at [facility], [address] [place], [place] [address]    Report Status PENDING  Incomplete  Culture, body fluid-bottle     Status: None (Preliminary result)   Collection Time: 03/08/19  8:58 PM   Specimen: Peritoneal Dialysate  Result Value Ref Range Status   Specimen Description PERITONEAL DIALYSATE  Final   Special Requests BOTTLES DRAWN AEROBIC AND ANAEROBIC  Final   Culture   Final    NO GROWTH 2 DAYS Performed at [facility], [place] [address], [place], [place] [address]    Report Status PENDING  Incomplete  Gram stain     Status: None   Collection Time: 03/08/19  8:58 PM   Specimen: Peritoneal Dialysate  Result Value Ref Range Status   Specimen Description PERITONEAL DIALYSATE  Final   Special Requests NONE  Final   Gram Stain   Final    WBC PRESENT, PREDOMINANTLY MONONUCLEAR NO ORGANISMS SEEN CYTOSPIN SMEAR Performed at [facility], [place] [address] [place], [place] [address]    Report  Status 03/08/2019 FINAL  Final  SARS CORONAVIRUS 2 (TAT 6-24 HRS) Nasopharyngeal Nasopharyngeal Swab     Status: Abnormal   Collection Time: 03/09/19 11:10 AM   Specimen: Nasopharyngeal Swab  Result Value Ref Range Status   SARS Coronavirus 2 POSITIVE (A) NEGATIVE Final    Comment: RESULT CALLED TO, READ BACK BY AND VERIFIED WITH: G COOPER,RN 1716 03/09/2019 D BRADLEY (NOTE) SARS-CoV-2 target nucleic acids are DETECTED. The SARS-CoV-2 RNA is generally detectable in upper and lower respiratory specimens during the acute phase of infection. Positive results are indicative of the presence of SARS-CoV-2 RNA. Clinical correlation with patient history and other diagnostic information is  necessary to determine patient infection status. Positive results do not rule out bacterial infection or co-infection with other viruses.  The expected result is Negative. Fact Sheet for Patients: SugarRoll.be Fact Sheet for Healthcare Providers: https://www.woods-mathews.com/ This test is not yet approved or cleared by the [place] FDA and  has been authorized for detection and/or diagnosis of SARS-CoV-2 by FDA under an Emergency Use Authorization (EUA). This EUA will remain  in effect (meaning this test can be used) for th e duration of the COVID-19 declaration under Section 564(b)(1) of the Act, 21 U.S.C. section 360bbb-3(b)(1), unless the authorization is terminated or revoked sooner. Performed at [facility], [place] [address], [place], [place] [address]     Radiology Reports CT ABDOMEN PELVIS WO CONTRAST  Result Date: 02/22/2019 CLINICAL DATA:  Persistent right-sided abdominal pain and palpable abnormality. Follow-up retroperitoneal hemorrhage. On peritoneal dialysis. EXAM: CT ABDOMEN AND PELVIS WITHOUT CONTRAST TECHNIQUE: Multidetector CT imaging of the abdomen and pelvis was performed following the standard protocol without IV contrast. COMPARISON:   01/20/2019 FINDINGS: Lower chest: No acute findings. Hepatobiliary: Hepatic cirrhosis again demonstrated. No hepatic mass visualized on this unenhanced exam. Prior cholecystectomy. No evidence of biliary obstruction. Pancreas: No mass or inflammatory process visualized on this unenhanced exam. Spleen:  Within  normal limits in size. Adrenals/Urinary tract: No evidence of urolithiasis or hydronephrosis. Unremarkable unopacified urinary bladder. A peritoneal dialysis catheter is again seen with tip in the pelvis a small amount of intraperitoneal air and fluid is again seen, which is attributable to the peritoneal dialysis. Stomach/Bowel: No evidence of obstruction, inflammatory process, or abnormal fluid collections. Diverticulosis is seen mainly involving the descending and sigmoid colon, however there is no evidence of diverticulitis. Vascular/Lymphatic: No pathologically enlarged lymph nodes identified. No evidence of abdominal aortic aneurysm. Aortic atherosclerosis incidentally noted. Reproductive:  No mass or other significant abnormality. Other: Interval decrease in right retroperitoneal hemorrhage is seen since previous study. A persistent retroperitoneal low-attenuation collection is seen in the right lower quadrant which measures 8.6 x 6.0 cm and likely represents a seroma. A smaller low-attenuation collection is now seen in the right groin which measures 4.2 x 2.4 cm and also likely represents a seroma. Musculoskeletal:  No suspicious bone lesions identified. IMPRESSION: 1. Interval decrease in right retroperitoneal hemorrhage since previous study. 2. Persistent low-attenuation collections in the right lower quadrant retroperitoneum and right groin, likely representing seromas. 3. Hepatic cirrhosis. 4. Mild ascites and free intraperitoneal air again seen, and attributable to peritoneal dialysis. 5. Colonic diverticulosis. No radiographic evidence of diverticulitis. Electronically Signed   By: Marlaine Hind  M.D.   On: 02/22/2019 10:50   DG Chest 2 View  Result Date: 03/02/2019 CLINICAL DATA:  Chest pain EXAM: CHEST - 2 VIEW COMPARISON:  02/11/2019 FINDINGS: Heart and mediastinal contours are within normal limits. No focal opacities or effusions. No acute bony abnormality. IMPRESSION: No active cardiopulmonary disease. Electronically Signed   By: Rolm Baptise M.D.   On: 03/02/2019 17:40   DG Chest 2 View  Result Date: 02/11/2019 CLINICAL DATA:  Chest pain EXAM: CHEST - 2 VIEW COMPARISON:  01/17/2019 FINDINGS: The heart size is stable from prior study. There is no pneumothorax. No large pleural effusion. No focal infiltrate. There is no acute osseous abnormality. Aortic calcifications are noted. IMPRESSION: No active cardiopulmonary disease. Electronically Signed   By: Constance Holster M.D.   On: 02/11/2019 21:44   CARDIAC CATHETERIZATION  Result Date: 03/03/2019  Mid LAD lesion is 75% stenosed.  Ost LAD to Mid LAD lesion is 40% stenosed.  Previously placed Ost 1st Mrg drug eluting stent is widely patent.  Mid Cx to Dist Cx lesion is 55% stenosed with 40% stenosed side branch in Ost 3rd Mrg.  Previously placed Prox RCA drug eluting stent is widely patent.  Ost 1st Mrg to 1st Mrg lesion is 99% stenosed. Partially in the stent and partially distal to the stent- subacte stent thrombosis.  LV end diastolic pressure is normal.  There is no aortic valve stenosis.  Medical therapy for stent thrombosis of the OM stent.  Given the severe tortuosity, I don't think repeat PCI attempt would be beneficial.  There was significant difficulty getting a stent delivered during the last cath.  I think there would be a high likelihood of completely occluding the vessel with wire manipulation. .   DG CHEST PORT 1 VIEW  Result Date: 03/09/2019 CLINICAL DATA:  Fever. EXAM: PORTABLE CHEST 1 VIEW COMPARISON:  03/08/2019. FINDINGS: Stable mild cardiomegaly. Mild bibasilar infiltrates with slightly improved aeration from  prior exam. Tiny left pleural effusion cannot be excluded. No pneumothorax. IMPRESSION: 1.  Stable mild cardiomegaly. 2. Mild bibasilar infiltrates with slightly improved aeration from prior exam. Tiny left pleural effusion cannot be excluded. Electronically Signed   By: Marcello Moores  Register   On: 03/09/2019 10:41   DG Chest Port 1 View  Result Date: 03/08/2019 CLINICAL DATA:  Fever, cough. EXAM: PORTABLE CHEST 1 VIEW COMPARISON:  03/02/2019. FINDINGS: Trachea is midline. Heart size is accentuated by AP semi upright technique and low lung volumes. Thoracic aorta is calcified. Mild bibasilar airspace opacification, new from 03/02/2019. No definite pleural fluid. IMPRESSION: 1. New mild bibasilar airspace opacification may be due in part to lower lung volumes than on 03/02/2019. The possibility of pneumonia, including due to COVID-19, is also considered. 2.  Aortic atherosclerosis (ICD10-I70.0). Electronically Signed   By: Lorin Picket M.D.   On: 03/08/2019 09:23

## 2019-03-10 NOTE — Progress Notes (Addendum)
[facility] Progress Note   Subjective:  COVID + (!) moved to 5W.  Started on decadron and remdesivir.  BP better.  Says she's feeling better.    Objective Vitals:   03/09/19 2314 03/10/19 0400 03/10/19 0800 03/10/19 1200  BP: (!) 129/54 (!) 151/93 139/88 (!) 149/88  Pulse: 69 63 62 62  Resp: 14 13 19 12   Temp: 97.9 F (36.6 C) 98.6 F (37 C)  98.1 F (36.7 C)  TempSrc: Oral Oral  Oral  SpO2: 95% 91% 92% 93%  Weight:      Height:       Physical Exam General: NAD, sleeping and arousable Heart: RRR; no murmur Lungs: CTAB; no c/w/r Abdomen: soft, non-tender Extremities: No LE edema Dialysis Access:  PD cath present, still hooked up to machine.   Additional Objective Labs: Basic Metabolic Panel: Recent Labs  Lab 03/08/19 0126 03/09/19 0412 03/10/19 0303  NA 133* 133* 133*  K 4.5 4.2 4.6  CL 93* 93* 94*  CO2 20* 20* 20*  GLUCOSE 117* 149* 188*  BUN 46* 47* 50*  CREATININE 15.42* 16.11* 17.06*  CALCIUM 6.8* 6.7* 7.0*  PHOS 4.0 5.4*  --    Liver Function Tests: Recent Labs  Lab 03/08/19 0126 03/09/19 0412 03/10/19 0303  AST 33  --  23  ALT 19  --  15  ALKPHOS 108  --  97  BILITOT 0.6  --  0.6  PROT 6.5  --  6.4*  ALBUMIN 2.7* 2.6* 2.3*   CBC: Recent Labs  Lab 03/05/19 1134 03/05/19 1134 03/06/19 0358 03/06/19 0358 03/08/19 0126 03/09/19 0412 03/10/19 0303  WBC 9.9   < > 7.9   < > 10.5 16.5* 11.0*  NEUTROABS  --   --   --   --  7.4  --   --   HGB 11.1*   < > 11.5*   < > 11.6* 11.5* 11.0*  HCT 34.9*   < > 36.7   < > 37.2 37.2 35.5*  MCV 99.4  --  101.4*  --  100.3* 102.2* 101.1*  PLT 430*   < > 411*   < > 386 299 273   < > = values in this interval not displayed.   Blood Culture    Component Value Date/Time   SDES PERITONEAL DIALYSATE 03/08/2019 2058   SDES PERITONEAL DIALYSATE 03/08/2019 2058   [place] AND ANAEROBIC 03/08/2019 2058   [place] NONE 03/08/2019 2058   CULT  03/08/2019 2058    NO  GROWTH 2 DAYS Performed at [facility] [address], [place], [place] [address]    REPTSTATUS PENDING 03/08/2019 [id number]   REPTSTATUS 03/08/2019 FINAL 03/08/2019 2058   Studies/Results: DG CHEST PORT 1 VIEW  Result Date: 03/09/2019 CLINICAL DATA:  Fever. EXAM: PORTABLE CHEST 1 VIEW COMPARISON:  03/08/2019. FINDINGS: Stable mild cardiomegaly. Mild bibasilar infiltrates with slightly improved aeration from prior exam. Tiny left pleural effusion cannot be excluded. No pneumothorax. IMPRESSION: 1.  Stable mild cardiomegaly. 2. Mild bibasilar infiltrates with slightly improved aeration from prior exam. Tiny left pleural effusion cannot be excluded. Electronically Signed   By: [place]   On: 03/09/2019 10:41   Medications: . sodium chloride 10 mL/hr at 03/03/19 0800  . ceFEPime (MAXIPIME) IV 1 g (03/09/19 1821)  . remdesivir 100 mg in NS 100 mL 100 mg (03/10/19 0944)   . aspirin EC  81 mg Oral Daily  . calcitRIOL  0.25 mcg Oral  Daily  . calcium acetate  1,334 mg Oral TID WC  . dexamethasone (DECADRON) injection  6 mg Intravenous q1800  . feeding supplement (PRO-STAT SUGAR FREE 64)  30 mL Oral BID  . ferric citrate  420 mg Oral TID WC  . folic acid  1 mg Oral Daily  . gabapentin  100 mg Oral QHS  . gentamicin cream  1 application Topical Daily  . heparin injection (subcutaneous)  5,000 Units Subcutaneous Q8H  . hydrocortisone   Topical QID  . insulin aspart  0-6 Units Subcutaneous TID WC  . isosorbide dinitrate  5 mg Oral BID  . midodrine  10 mg Oral TID WC  . pantoprazole  40 mg Oral BID AC  . rosuvastatin  10 mg Oral Daily  . ticagrelor  90 mg Oral BID  . vancomycin variable dose per unstable renal function (pharmacist dosing)   Does not apply See admin instructions  . vitamin B-12  1,000 mcg Oral Daily    Dialysis Orders: CCPD 6 exchanges 2.7Lfill.1.5h dwell No day dwell.(Patient says she has been doing 5 exchanges)  Assessment/Plan: 1. CAD/Unstable angina.  Known multivessel disease. LHC 2/12 with progressive disease, occlusion of recent OM stent. Cardiology following - medical therapy for now- started on isosorbidewhich has helped CP.  Suspect hypotension was likely brewing sepsis/ COVID in retrospect, will decrease midodrine to 5 mg BID as Bps 140s-160s.   2. Sepsis/bacteremia: Febrile overnight 2/16. No signs of peritonitis, but studies ordered -> cell ct 19, Cx pending. CXR with new bibasilar opacities, ?pna. BCx drawn -> now 1 of 2 growing GPC, started on Vanc/Cefepime.  COVID +.  Getting decadron and remdesivir too 3. ESRD: Continue CCPD- changed to all 1.5% fluids as of 2/15 - follow.  Weights stable.   4. Hypokalemia: Improving. 5. Hypertension/volume: See above. On isosorbide + mido now. 6. Anemia - Hgb 11.5. No ESA needs. Follow trends. 7. Metabolic bone disease: Caremains low - ^ Phoslo to 2/meals and sensipar held. Will start calcitriol 0.47mcg PO q day. 8. Nutrition:Continuepro-stat supplements while here. 9. GERD: on PPI  Madelon Lips MD 03/10/2019, 12:31 PM  [facility] Pager: [phone]

## 2019-03-10 NOTE — Progress Notes (Signed)
PT Cancellation Note  Patient Details Name: Brenda Davis MRN: [id number] DOB: 02/21/1965   Cancelled Treatment:    Reason Eval/Treat Not Completed: (P) Medical issues which prohibited therapy. Pt has been receiving dialysis tx since early this morning. Attempted to see pt twice however pt still hooked up to dialysis machine at present time. RN has contacted nephrology. PT will follow back as time allows.   Nyx Keady B. Migdalia Dk PT, DPT Acute Rehabilitation Services Pager [phone] Office [phone]  [place] 03/10/2019, 3:31 PM

## 2019-03-11 LAB — CBC
HCT: 34.6 % — ABNORMAL LOW (ref 36.0–46.0)
Hemoglobin: 11.6 g/dL — ABNORMAL LOW (ref 12.0–15.0)
MCH: 32 pg (ref 26.0–34.0)
MCHC: 33.5 g/dL (ref 30.0–36.0)
MCV: 95.6 fL (ref 80.0–100.0)
Platelets: 267 10*3/uL (ref 150–400)
RBC: 3.62 MIL/uL — ABNORMAL LOW (ref 3.87–5.11)
RDW: 15.9 % — ABNORMAL HIGH (ref 11.5–15.5)
WBC: 14.6 10*3/uL — ABNORMAL HIGH (ref 4.0–10.5)
nRBC: 1 % — ABNORMAL HIGH (ref 0.0–0.2)

## 2019-03-11 LAB — COMPREHENSIVE METABOLIC PANEL
ALT: 13 U/L (ref 0–44)
AST: 18 U/L (ref 15–41)
Albumin: 2.1 g/dL — ABNORMAL LOW (ref 3.5–5.0)
Alkaline Phosphatase: 83 U/L (ref 38–126)
Anion gap: 17 — ABNORMAL HIGH (ref 5–15)
BUN: 53 mg/dL — ABNORMAL HIGH (ref 6–20)
CO2: 19 mmol/L — ABNORMAL LOW (ref 22–32)
Calcium: 6.8 mg/dL — ABNORMAL LOW (ref 8.9–10.3)
Chloride: 96 mmol/L — ABNORMAL LOW (ref 98–111)
Creatinine, Ser: 16.16 mg/dL — ABNORMAL HIGH (ref 0.44–1.00)
GFR calc Af Amer: 3 mL/min — ABNORMAL LOW (ref >60)
GFR calc non Af Amer: 2 mL/min — ABNORMAL LOW (ref >60)
Glucose, Bld: 215 mg/dL — ABNORMAL HIGH (ref 70–99)
Potassium: 3.9 mmol/L (ref 3.5–5.1)
Sodium: 132 mmol/L — ABNORMAL LOW (ref 135–145)
Total Bilirubin: 0.5 mg/dL (ref 0.3–1.2)
Total Protein: 5.8 g/dL — ABNORMAL LOW (ref 6.5–8.1)

## 2019-03-11 LAB — GLUCOSE, CAPILLARY
Glucose-Capillary: 221 mg/dL — ABNORMAL HIGH (ref 70–99)
Glucose-Capillary: 251 mg/dL — ABNORMAL HIGH (ref 70–99)
Glucose-Capillary: 226 mg/dL — ABNORMAL HIGH (ref 70–99)
Glucose-Capillary: 179 mg/dL — ABNORMAL HIGH (ref 70–99)

## 2019-03-11 LAB — D-DIMER, QUANTITATIVE: D-Dimer, Quant: 1.52 ug/mL-FEU — ABNORMAL HIGH (ref 0.00–0.50)

## 2019-03-11 LAB — PROCALCITONIN: Procalcitonin: 20.91 ng/mL

## 2019-03-11 LAB — FERRITIN: Ferritin: 3877 ng/mL — ABNORMAL HIGH (ref 11–307)

## 2019-03-11 LAB — VANCOMYCIN, RANDOM: Vancomycin Rm: 23

## 2019-03-11 LAB — C-REACTIVE PROTEIN: CRP: 17.2 mg/dL — ABNORMAL HIGH (ref <1.0)

## 2019-03-11 MED ORDER — ISOSORBIDE DINITRATE 10 MG PO TABS
10.0000 mg | ORAL_TABLET | Freq: Two times a day (BID) | ORAL | Status: DC
  Administered 2019-03-11 – 2019-03-14 (×7): 10 mg via ORAL
  Filled 2019-03-11 (×8): qty 1

## 2019-03-11 MED ORDER — MIDODRINE HCL 5 MG PO TABS
5.0000 mg | ORAL_TABLET | Freq: Two times a day (BID) | ORAL | Status: DC
  Administered 2019-03-11 – 2019-03-12 (×2): 5 mg via ORAL
  Filled 2019-03-11 (×2): qty 1

## 2019-03-11 NOTE — Progress Notes (Signed)
PROGRESS NOTE                                                                                                                                                                                                             Patient Demographics:    Brenda Davis, is a 54 y.o. female, DOB - [birth year], AUQ:[id number]  Outpatient Primary MD for the patient is Brenda Lima, MD   Admit date - 03/02/2019   LOS - 8  Chief Complaint  Patient presents with  . Chest Pain       Brief Narrative: Patient is a 54 y.o. female with PMHx of ESRD on PD, CAD s/p PCI, chronic diastolic heart failure, DM, HLD who presented to the ED on 2/11 with chest pain-found to have non-STEMI-underwent LHC-Per cardiology due to tortuosity and location of the vessel-not amenable for PCI-and medical management recommended.  Hospital course complicated by hypotension, and fever-subsequently found to have COVID-19.  See below for further details.   Subjective:   Lying comfortably in bed-denies any chest pain or shortness of breath.   Assessment  & Plan :   Non-STEMI-prior history of CAD s/p PCI 01/17/2019:  remains chest pain-free-per cardiology-due to location/tortuosity of culprit lesion-not a candidate for PCI-medical management recommended.  Continue aspirin/statin/Brilinta-since BP now somewhat stable-has increase Isordil to 10 mg twice daily.  Midodrine being slowly tapered down.   Sepsis secondary to COVID-19 pneumonia and concurrent bacterial pneumonia: Sepsis physiology improving-on room air this morning-fever over the past few days likely from COVID-19 pneumonia.  Blood cultures on 2/17-positive Staph capitis-spoke with ID-Dr. Norwood Levo recommends awaiting definite culture results-and obtaining CT chest/abdomen.  Peritoneal dialysate cultures on 2/17 neg as well.  Procalcitonin is still significantly elevated-but downtrending-for now continue  steroids/remdesivir/vancomycin/cefepime.  Fever: afebrile this morning  O2 requirements:  SpO2: 93 % O2 Flow Rate (L/min): 2 L/min   COVID-19 Labs: Recent Labs    03/10/19 0303 03/11/19 0500  DDIMER 2.70* 1.52*  FERRITIN 3,364* 3,877*  CRP 29.3* 17.2*       Component Value Date/Time   BNP 110.2 (H) 06/18/2018 2314    Recent Labs  Lab 03/08/19 1022 03/09/19 0412 03/10/19 0303 03/11/19 0500  PROCALCITON 6.71 16.09 21.54 20.91    Lab Results  Component Value Date   SARSCOV2NAA POSITIVE (A) 03/09/2019   [place] NEGATIVE 03/02/2019  [place] NEGATIVE 01/15/2019   [place] NEGATIVE 06/18/2018     COVID-19 Medications: Steroids: 2/18>> Remdesivir: 2/18>>  Antibiotics: Vancomycin:2/17>> Cefepime: 2/17>>  Prone/Incentive Spirometry: encouraged  incentive spirometry use 3-4/hour.  DVT Prophylaxis  :   Heparin   ESRD on peritoneal dialysis: Nephrology following and directing care  Hypotension: BP is stable-continue midodrine  Chronic diastolic heart failure: Stable volume status-diuresis with peritoneal dialysis.  DM-2 (A1c 7.21 December 2018): CBG stable-continue SSI.  Resume Metformin on discharge.  Recent Labs    03/10/19 1607 03/10/19 2110 03/11/19 0753  GLUCAP 134* 177* 221*   GERD: Continue PPI  Morbid Obesity: Estimated body mass index is 46.92 kg/m as calculated from the following:   Height as of this encounter: 5' 5"  (1.651 m).   Weight as of this encounter: 127.9 kg.    Consults  : Nephrology/cardiology  Procedures  :   Left heart catheterization on 2/12  ABG:    Component Value Date/Time   PHART 7.307 (L) 01/18/2019 2358   PCO2ART 50.4 (H) 01/18/2019 2358   PO2ART 84.0 01/18/2019 2358   HCO3 25.3 01/18/2019 2358   TCO2 27 01/18/2019 2358   ACIDBASEDEF 1.0 01/18/2019 2358   O2SAT 95.0 01/18/2019 2358    Vent Settings: N/A  Condition -Guarded  Family Communication  :  Spouse updated over the phone 2/20  Code  Status :  Full Code  Diet :  Diet Order            Diet heart healthy/carb modified Room service appropriate? Yes; Fluid consistency: Thin  Diet effective now               Disposition Plan  :  Remain hospitalized  Barriers to discharge: Complete 5 days of IV Remdesivir/IV antibiotics  Antimicorbials  :    Anti-infectives (From admission, onward)   Start     Dose/Rate Route Frequency Ordered Stop   03/10/19 1000  remdesivir 100 mg in sodium chloride 0.9 % 100 mL IVPB     100 mg 200 mL/hr over 30 Minutes Intravenous Daily 03/09/19 1743 03/14/19 0959   03/09/19 1830  remdesivir 200 mg in sodium chloride 0.9% 250 mL IVPB     200 mg 580 mL/hr over 30 Minutes Intravenous Once 03/09/19 1743 03/09/19 2111   03/09/19 1800  ceFEPIme (MAXIPIME) 1 g in sodium chloride 0.9 % 100 mL IVPB     1 g 200 mL/hr over 30 Minutes Intravenous Every 24 hours 03/08/19 1122     03/08/19 1600  vancomycin (VANCOCIN) 2,500 mg in sodium chloride 0.9 % 500 mL IVPB     2,500 mg 250 mL/hr over 120 Minutes Intravenous  Once 03/08/19 1252 03/08/19 1712   03/08/19 1400  ceFEPIme (MAXIPIME) 2 g in sodium chloride 0.9 % 100 mL IVPB     2 g 200 mL/hr over 30 Minutes Intravenous  Once 03/08/19 1122 03/08/19 1654   03/08/19 1030  vancomycin (VANCOCIN) 2,500 mg in sodium chloride 0.9 % 500 mL IVPB  Status:  Discontinued     2,500 mg 250 mL/hr over 120 Minutes Intravenous  Once 03/08/19 0932 03/08/19 1510   03/08/19 0931  vancomycin variable dose per unstable renal function (pharmacist dosing)      Does not apply See admin instructions 03/08/19 0932     03/08/19 0930  cefTRIAXone (ROCEPHIN) 1 g in sodium chloride 0.9 % 100 mL IVPB  Status:  Discontinued     1 g 200 mL/hr over 30 Minutes Intravenous  Once 03/08/19  2831 03/08/19 1126      Inpatient Medications  Scheduled Meds: . aspirin EC  81 mg Oral Daily  . calcitRIOL  0.25 mcg Oral Daily  . calcium acetate  1,334 mg Oral TID WC  . dexamethasone (DECADRON)  injection  6 mg Intravenous q1800  . feeding supplement (PRO-STAT SUGAR FREE 64)  30 mL Oral BID  . ferric citrate  420 mg Oral TID WC  . folic acid  1 mg Oral Daily  . gabapentin  100 mg Oral QHS  . gentamicin cream  1 application Topical Daily  . heparin injection (subcutaneous)  5,000 Units Subcutaneous Q8H  . hydrocortisone   Topical QID  . insulin aspart  0-6 Units Subcutaneous TID WC  . isosorbide dinitrate  10 mg Oral BID  . midodrine  5 mg Oral BID WC  . pantoprazole  40 mg Oral BID AC  . rosuvastatin  10 mg Oral Daily  . ticagrelor  90 mg Oral BID  . vancomycin variable dose per unstable renal function (pharmacist dosing)   Does not apply See admin instructions  . vitamin B-12  1,000 mcg Oral Daily   Continuous Infusions: . sodium chloride 10 mL/hr at 03/03/19 0800  . ceFEPime (MAXIPIME) IV Stopped (03/10/19 1900)  . remdesivir 100 mg in NS 100 mL 100 mg (03/11/19 1023)   PRN Meds:.acetaminophen, diclofenac Sodium, gentamicin cream, dianeal solution for CAPD/CCPD with heparin, dianeal solution for CAPD/CCPD with heparin, HYDROcodone-acetaminophen, morphine injection, nitroGLYCERIN, ondansetron (ZOFRAN) IV   Time Spent in minutes  35   See all Orders from today for further details   Oren Binet M.D on 03/11/2019 at 10:42 AM  To page go to [email or web address] - use universal password  [facility] -  Office  [phone]    Objective:   Vitals:   03/11/19 0400 03/11/19 0609 03/11/19 0632 03/11/19 0634  BP:  (!) 154/87    Pulse: (!) 59 62 (!) 56   Resp: 15 10 16    Temp:  (!) 97.5 F (36.4 C)    TempSrc:  Oral    SpO2: 90% 94% 93%   Weight:    127.9 kg  Height:        Wt Readings from Last 3 Encounters:  03/11/19 127.9 kg  02/17/19 125.6 kg  02/11/19 127.9 kg     Intake/Output Summary (Last 24 hours) at 03/11/2019 1042 Last data filed at 03/11/2019 0600 Gross per 24 hour  Intake 12645 ml  Output 12795 ml  Net -150 ml     Physical Exam Gen  Exam:Alert awake-not in any distress HEENT:atraumatic, normocephalic Chest: B/L clear to auscultation anteriorly CVS:S1S2 regular Abdomen:soft non tender, non distended Extremities:no edema Neurology: Non focal Skin: no rash   Data Review:    CBC Recent Labs  Lab 03/06/19 0358 03/08/19 0126 03/09/19 0412 03/10/19 0303 03/11/19 0500  WBC 7.9 10.5 16.5* 11.0* 14.6*  HGB 11.5* 11.6* 11.5* 11.0* 11.6*  HCT 36.7 37.2 37.2 35.5* 34.6*  PLT 411* 386 299 273 267  MCV 101.4* 100.3* 102.2* 101.1* 95.6  MCH 31.8 31.3 31.6 31.3 32.0  MCHC 31.3 31.2 30.9 31.0 33.5  RDW 16.5* 16.9* 17.0* 17.0* 15.9*  LYMPHSABS  --  1.1  --   --   --   MONOABS  --  1.6*  --   --   --   EOSABS  --  0.1  --   --   --   BASOSABS  --  0.0  --   --   --  Chemistries  Recent Labs  Lab 03/07/19 0427 03/08/19 0126 03/09/19 0412 03/10/19 0303 03/11/19 0500  NA 132* 133* 133* 133* 132*  K 3.4* 4.5 4.2 4.6 3.9  CL 92* 93* 93* 94* 96*  CO2 23 20* 20* 20* 19*  GLUCOSE 88 117* 149* 188* 215*  BUN 45* 46* 47* 50* 53*  CREATININE 15.44* 15.42* 16.11* 17.06* 16.16*  CALCIUM 6.8* 6.8* 6.7* 7.0* 6.8*  AST  --  33  --  23 18  ALT  --  19  --  15 13  ALKPHOS  --  108  --  97 83  BILITOT  --  0.6  --  0.6 0.5   ------------------------------------------------------------------------------------------------------------------ No results for input(s): CHOL, HDL, LDLCALC, TRIG, CHOLHDL, LDLDIRECT in the last 72 hours.  Lab Results  Component Value Date   HGBA1C 7.2 (H) 01/17/2019   ------------------------------------------------------------------------------------------------------------------ No results for input(s): TSH, T4TOTAL, T3FREE, THYROIDAB in the last 72 hours.  Invalid input(s): FREET3 ------------------------------------------------------------------------------------------------------------------ Recent Labs    03/10/19 0303 03/11/19 0500  FERRITIN 3,364* 3,877*    Coagulation  profile No results for input(s): INR, PROTIME in the last 168 hours.  Recent Labs    03/10/19 0303 03/11/19 0500  DDIMER 2.70* 1.52*    Cardiac Enzymes No results for input(s): CKMB, TROPONINI, MYOGLOBIN in the last 168 hours.  Invalid input(s): CK ------------------------------------------------------------------------------------------------------------------    Component Value Date/Time   BNP 110.2 (H) 06/18/2018 2314    Micro Results Recent Results (from the past 240 hour(s))  SARS CORONAVIRUS 2 (TAT 6-24 HRS) Nasopharyngeal Nasopharyngeal Swab     Status: None   Collection Time: 03/02/19 11:47 PM   Specimen: Nasopharyngeal Swab  Result Value Ref Range Status   SARS Coronavirus 2 NEGATIVE NEGATIVE Final    Comment: (NOTE) SARS-CoV-2 target nucleic acids are NOT DETECTED. The SARS-CoV-2 RNA is generally detectable in upper and lower respiratory specimens during the acute phase of infection. Negative results do not preclude SARS-CoV-2 infection, do not rule out co-infections with other pathogens, and should not be used as the sole basis for treatment or other patient management decisions. Negative results must be combined with clinical observations, patient history, and epidemiological information. The expected result is Negative. Fact Sheet for Patients: SugarRoll.be Fact Sheet for Healthcare Providers: https://www.woods-mathews.com/ This test is not yet approved or cleared by the [place] FDA and  has been authorized for detection and/or diagnosis of SARS-CoV-2 by FDA under an Emergency Use Authorization (EUA). This EUA will remain  in effect (meaning this test can be used) for the duration of the COVID-19 declaration under Section 56 4(b)(1) of the Act, 21 U.S.C. section 360bbb-3(b)(1), unless the authorization is terminated or revoked sooner. Performed at [facility], [place] [address] [place],  [place] [address]   Culture, blood (routine x 2)     Status: Abnormal (Preliminary result)   Collection Time: 03/08/19  1:15 AM   Specimen: BLOOD  Result Value Ref Range Status   Specimen Description BLOOD RIGHT HAND  Final   Special Requests   Final    BOTTLES DRAWN AEROBIC AND ANAEROBIC Blood Culture adequate volume   Culture  Setup Time   Final    GRAM POSITIVE COCCI IN CLUSTERS ANAEROBIC BOTTLE ONLY CRITICAL RESULT CALLED TO, READ BACK BY AND VERIFIED WITH: Alona Bene PHARMD, AT [place] 03/09/19 BY D.VANHOOK Performed at [facility], [place] [address] [place], [place] [address]    Culture (A)  Final    STAPHYLOCOCCUS CAPITIS  SUSCEPTIBILITIES TO FOLLOW    Report Status PENDING  Incomplete  Culture, blood (routine x 2)     Status: None (Preliminary result)   Collection Time: 03/08/19  1:24 AM   Specimen: BLOOD  Result Value Ref Range Status   Specimen Description BLOOD RIGHT ARM  Final   Special Requests   Final    BOTTLES DRAWN AEROBIC AND ANAEROBIC Blood Culture adequate volume Performed at [facility], [place] [address] [place], [place] [address]    Culture NO GROWTH 3 DAYS  Final   Report Status PENDING  Incomplete  Culture, body fluid-bottle     Status: None (Preliminary result)   Collection Time: 03/08/19  8:58 PM   Specimen: Peritoneal Dialysate  Result Value Ref Range Status   Specimen Description PERITONEAL DIALYSATE  Final   Special Requests BOTTLES DRAWN AEROBIC AND ANAEROBIC  Final   Culture NO GROWTH 3 DAYS  Final   Report Status PENDING  Incomplete  Gram stain     Status: None   Collection Time: 03/08/19  8:58 PM   Specimen: Peritoneal Dialysate  Result Value Ref Range Status   Specimen Description PERITONEAL DIALYSATE  Final   Special Requests NONE  Final   Gram Stain   Final    WBC PRESENT, PREDOMINANTLY MONONUCLEAR NO ORGANISMS SEEN CYTOSPIN SMEAR Performed at [facility], [place] [address] [place], [place] [address]    Report Status 03/08/2019  FINAL  Final  SARS CORONAVIRUS 2 (TAT 6-24 HRS) Nasopharyngeal Nasopharyngeal Swab     Status: Abnormal   Collection Time: 03/09/19 11:10 AM   Specimen: Nasopharyngeal Swab  Result Value Ref Range Status   SARS Coronavirus 2 POSITIVE (A) NEGATIVE Final    Comment: RESULT CALLED TO, READ BACK BY AND VERIFIED WITH: G COOPER,RN 1716 03/09/2019 D BRADLEY (NOTE) SARS-CoV-2 target nucleic acids are DETECTED. The SARS-CoV-2 RNA is generally detectable in upper and lower respiratory specimens during the acute phase of infection. Positive results are indicative of the presence of SARS-CoV-2 RNA. Clinical correlation with patient history and other diagnostic information is  necessary to determine patient infection status. Positive results do not rule out bacterial infection or co-infection with other viruses.  The expected result is Negative. Fact Sheet for Patients: SugarRoll.be Fact Sheet for Healthcare Providers: https://www.woods-mathews.com/ This test is not yet approved or cleared by the [place] FDA and  has been authorized for detection and/or diagnosis of SARS-CoV-2 by FDA under an Emergency Use Authorization (EUA). This EUA will remain  in effect (meaning this test can be used) for th e duration of the COVID-19 declaration under Section 564(b)(1) of the Act, 21 U.S.C. section 360bbb-3(b)(1), unless the authorization is terminated or revoked sooner. Performed at [facility], [place] [address], [place], [place] [address]   MRSA PCR Screening     Status: None   Collection Time: 03/10/19  7:32 AM   Specimen: Nasopharyngeal  Result Value Ref Range Status   MRSA by PCR NEGATIVE NEGATIVE Final    Comment:        The GeneXpert MRSA Assay (FDA approved for NASAL specimens only), is one component of a comprehensive MRSA colonization surveillance program. It is not intended to diagnose MRSA infection nor to guide or monitor treatment  for MRSA infections. Performed at [facility], [place] [address] [place], [place] [address]     Radiology Reports CT ABDOMEN PELVIS WO CONTRAST  Result Date: 02/22/2019 CLINICAL DATA:  Persistent right-sided abdominal  pain and palpable abnormality. Follow-up retroperitoneal hemorrhage. On peritoneal dialysis. EXAM: CT ABDOMEN AND PELVIS WITHOUT CONTRAST TECHNIQUE: Multidetector CT imaging of the abdomen and pelvis was performed following the standard protocol without IV contrast. COMPARISON:  01/20/2019 FINDINGS: Lower chest: No acute findings. Hepatobiliary: Hepatic cirrhosis again demonstrated. No hepatic mass visualized on this unenhanced exam. Prior cholecystectomy. No evidence of biliary obstruction. Pancreas: No mass or inflammatory process visualized on this unenhanced exam. Spleen:  Within normal limits in size. Adrenals/Urinary tract: No evidence of urolithiasis or hydronephrosis. Unremarkable unopacified urinary bladder. A peritoneal dialysis catheter is again seen with tip in the pelvis a small amount of intraperitoneal air and fluid is again seen, which is attributable to the peritoneal dialysis. Stomach/Bowel: No evidence of obstruction, inflammatory process, or abnormal fluid collections. Diverticulosis is seen mainly involving the descending and sigmoid colon, however there is no evidence of diverticulitis. Vascular/Lymphatic: No pathologically enlarged lymph nodes identified. No evidence of abdominal aortic aneurysm. Aortic atherosclerosis incidentally noted. Reproductive:  No mass or other significant abnormality. Other: Interval decrease in right retroperitoneal hemorrhage is seen since previous study. A persistent retroperitoneal low-attenuation collection is seen in the right lower quadrant which measures 8.6 x 6.0 cm and likely represents a seroma. A smaller low-attenuation collection is now seen in the right groin which measures 4.2 x 2.4 cm and also likely represents a seroma.  Musculoskeletal:  No suspicious bone lesions identified. IMPRESSION: 1. Interval decrease in right retroperitoneal hemorrhage since previous study. 2. Persistent low-attenuation collections in the right lower quadrant retroperitoneum and right groin, likely representing seromas. 3. Hepatic cirrhosis. 4. Mild ascites and free intraperitoneal air again seen, and attributable to peritoneal dialysis. 5. Colonic diverticulosis. No radiographic evidence of diverticulitis. Electronically Signed   By: Marlaine Hind M.D.   On: 02/22/2019 10:50   DG Chest 2 View  Result Date: 03/02/2019 CLINICAL DATA:  Chest pain EXAM: CHEST - 2 VIEW COMPARISON:  02/11/2019 FINDINGS: Heart and mediastinal contours are within normal limits. No focal opacities or effusions. No acute bony abnormality. IMPRESSION: No active cardiopulmonary disease. Electronically Signed   By: Rolm Baptise M.D.   On: 03/02/2019 17:40   DG Chest 2 View  Result Date: 02/11/2019 CLINICAL DATA:  Chest pain EXAM: CHEST - 2 VIEW COMPARISON:  01/17/2019 FINDINGS: The heart size is stable from prior study. There is no pneumothorax. No large pleural effusion. No focal infiltrate. There is no acute osseous abnormality. Aortic calcifications are noted. IMPRESSION: No active cardiopulmonary disease. Electronically Signed   By: Constance Holster M.D.   On: 02/11/2019 21:44   CARDIAC CATHETERIZATION  Result Date: 03/03/2019  Mid LAD lesion is 75% stenosed.  Ost LAD to Mid LAD lesion is 40% stenosed.  Previously placed Ost 1st Mrg drug eluting stent is widely patent.  Mid Cx to Dist Cx lesion is 55% stenosed with 40% stenosed side branch in Ost 3rd Mrg.  Previously placed Prox RCA drug eluting stent is widely patent.  Ost 1st Mrg to 1st Mrg lesion is 99% stenosed. Partially in the stent and partially distal to the stent- subacte stent thrombosis.  LV end diastolic pressure is normal.  There is no aortic valve stenosis.  Medical therapy for stent thrombosis of  the OM stent.  Given the severe tortuosity, I don't think repeat PCI attempt would be beneficial.  There was significant difficulty getting a stent delivered during the last cath.  I think there would be a high likelihood of completely occluding the vessel with wire  manipulation. .   DG CHEST PORT 1 VIEW  Result Date: 03/09/2019 CLINICAL DATA:  Fever. EXAM: PORTABLE CHEST 1 VIEW COMPARISON:  03/08/2019. FINDINGS: Stable mild cardiomegaly. Mild bibasilar infiltrates with slightly improved aeration from prior exam. Tiny left pleural effusion cannot be excluded. No pneumothorax. IMPRESSION: 1.  Stable mild cardiomegaly. 2. Mild bibasilar infiltrates with slightly improved aeration from prior exam. Tiny left pleural effusion cannot be excluded. Electronically Signed   By: Marcello Moores  Register   On: 03/09/2019 10:41   DG Chest Port 1 View  Result Date: 03/08/2019 CLINICAL DATA:  Fever, cough. EXAM: PORTABLE CHEST 1 VIEW COMPARISON:  03/02/2019. FINDINGS: Trachea is midline. Heart size is accentuated by AP semi upright technique and low lung volumes. Thoracic aorta is calcified. Mild bibasilar airspace opacification, new from 03/02/2019. No definite pleural fluid. IMPRESSION: 1. New mild bibasilar airspace opacification may be due in part to lower lung volumes than on 03/02/2019. The possibility of pneumonia, including due to COVID-19, is also considered. 2.  Aortic atherosclerosis (ICD10-I70.0). Electronically Signed   By: Lorin Picket M.D.   On: 03/08/2019 09:23

## 2019-03-11 NOTE — Progress Notes (Signed)
Patient pleasant and cooperative with assessment and meds. Patient very delayed in verbal and physical response. Patient repeatedly stated name when asked birth date, then repeatedly stated birth date when asked current date. Patient does not want HS snack. Patient assisted to bed and is repositioning self at this time, bed alarm on and call light within reach. Will continue to monitor.

## 2019-03-11 NOTE — Progress Notes (Signed)
Pharmacy Antibiotic Note  Brenda Davis is a 54 y.o. female admitted on 03/02/2019 with chest pain.  Pharmacy has been consulted for vancomycin and cefepime dosing for sepsis of unknown source. Blood cultures positive 1/4 coagulase negative staph, but could be contaminant. Of note, patient has ESRD on CCPD PTA and continuing while in hospital.   Patient is afebrile. WBC trending up 11>14.6. Procalcitonin has been elevated 6.71>16.09>21.54>20.91. Use of procalcitonin in this patient population is controversial.   Vancomycin random level this morning is elevated at 23. Will plan to redose when level is <20.   Vancomycin 2/17>> Cefepime 2/17>>  2/17 Peritoneal dialysate fluid Cx: no growth x3 days 2/17 Blood Cx: Coag negative staph 1/4 bottles  Plan: - F/u vancomycin random level tomorrow morning - Continue cefepime 1g IV q24hr - F/u length of therapy plans, repeat c/s, coag negative staph species sensitivities if plan is to treat based on this  Height: 5' 5"  (165.1 cm) Weight: 281 lb 15.5 oz (127.9 kg) IBW/kg (Calculated) : 57  Temp (24hrs), Avg:97.9 F (36.6 C), Min:97.4 F (36.3 C), Max:98.2 F (36.8 C)  Recent Labs  Lab 03/06/19 0358 03/07/19 0427 03/08/19 0126 03/09/19 0412 03/10/19 0303 03/11/19 0500  WBC 7.9  --  10.5 16.5* 11.0* 14.6*  CREATININE  --  15.44* 15.42* 16.11* 17.06* 16.16*  LATICACIDVEN  --   --  1.4  --   --   --   VANCORANDOM  --   --   --   --   --  23    Estimated Creatinine Clearance: 5.4 mL/min (A) (by C-G formula based on SCr of 16.16 mg/dL (H)).    Allergies  Allergen Reactions  . Amlodipine Swelling  . Atorvastatin     Elevated LFT's  . Clonidine Derivatives Swelling    Limbs swell  . Doxycycline Nausea And Vomiting    "I threw up for 3 hours"  . Welchol [Colesevelam Hcl] Nausea Only    Thank you for allowing pharmacy to be a part of this patient's care.  Agnes Lawrence, PharmD PGY1 Pharmacy Resident

## 2019-03-11 NOTE — Progress Notes (Signed)
[facility] Progress Note   Subjective:  Pt reports she's feeling better.  Stting on side of bed talking to husband.  Objective Vitals:   03/11/19 0400 03/11/19 0609 03/11/19 0632 03/11/19 0634  BP:  (!) 154/87    Pulse: (!) 59 62 (!) 56   Resp: 15 10 16    Temp:  (!) 97.5 F (36.4 C)    TempSrc:  Oral    SpO2: 90% 94% 93%   Weight:    127.9 kg  Height:       Physical Exam General: NAD, sleeping and arousable Heart: RRR; no murmur Lungs: CTAB; no c/w/r Abdomen: soft, non-tender Extremities: No LE edema Dialysis Access:  PD cath present, still hooked up to machine.   Additional Objective Labs: Basic Metabolic Panel: Recent Labs  Lab 03/08/19 0126 03/08/19 0126 03/09/19 0412 03/10/19 0303 03/11/19 0500  NA 133*   < > 133* 133* 132*  K 4.5   < > 4.2 4.6 3.9  CL 93*   < > 93* 94* 96*  CO2 20*   < > 20* 20* 19*  GLUCOSE 117*   < > 149* 188* 215*  BUN 46*   < > 47* 50* 53*  CREATININE 15.42*   < > 16.11* 17.06* 16.16*  CALCIUM 6.8*   < > 6.7* 7.0* 6.8*  PHOS 4.0  --  5.4*  --   --    < > = values in this interval not displayed.   Liver Function Tests: Recent Labs  Lab 03/08/19 0126 03/08/19 0126 03/09/19 0412 03/10/19 0303 03/11/19 0500  AST 33  --   --  23 18  ALT 19  --   --  15 13  ALKPHOS 108  --   --  97 83  BILITOT 0.6  --   --  0.6 0.5  PROT 6.5  --   --  6.4* 5.8*  ALBUMIN 2.7*   < > 2.6* 2.3* 2.1*   < > = values in this interval not displayed.   CBC: Recent Labs  Lab 03/06/19 0358 03/06/19 0358 03/08/19 0126 03/08/19 0126 03/09/19 0412 03/10/19 0303 03/11/19 0500  WBC 7.9   < > 10.5   < > 16.5* 11.0* 14.6*  NEUTROABS  --   --  7.4  --   --   --   --   HGB 11.5*   < > 11.6*   < > 11.5* 11.0* 11.6*  HCT 36.7   < > 37.2   < > 37.2 35.5* 34.6*  MCV 101.4*  --  100.3*  --  102.2* 101.1* 95.6  PLT 411*   < > 386   < > 299 273 267   < > = values in this interval not displayed.   Blood Culture    Component Value Date/Time    SDES PERITONEAL DIALYSATE 03/08/2019 2058   SDES PERITONEAL DIALYSATE 03/08/2019 2058   SPECREQUEST BOTTLES DRAWN AEROBIC AND ANAEROBIC 03/08/2019 2058   [place] NONE 03/08/2019 2058   CULT NO GROWTH 3 DAYS 03/08/2019 2058   REPTSTATUS PENDING 03/08/2019 2058   REPTSTATUS 03/08/2019 FINAL 03/08/2019 2058   Studies/Results: DG CHEST PORT 1 VIEW  Result Date: 03/09/2019 CLINICAL DATA:  Fever. EXAM: PORTABLE CHEST 1 VIEW COMPARISON:  03/08/2019. FINDINGS: Stable mild cardiomegaly. Mild bibasilar infiltrates with slightly improved aeration from prior exam. Tiny left pleural effusion cannot be excluded. No pneumothorax. IMPRESSION: 1.  Stable mild cardiomegaly. 2. Mild bibasilar infiltrates with slightly improved aeration from prior  exam. Tiny left pleural effusion cannot be excluded. Electronically Signed   By: [place]   On: 03/09/2019 10:41   Medications: . sodium chloride 10 mL/hr at 03/03/19 0800  . ceFEPime (MAXIPIME) IV Stopped (03/10/19 1900)  . remdesivir 100 mg in NS 100 mL Stopped (03/10/19 1900)   . aspirin EC  81 mg Oral Daily  . calcitRIOL  0.25 mcg Oral Daily  . calcium acetate  1,334 mg Oral TID WC  . dexamethasone (DECADRON) injection  6 mg Intravenous q1800  . feeding supplement (PRO-STAT SUGAR FREE 64)  30 mL Oral BID  . ferric citrate  420 mg Oral TID WC  . folic acid  1 mg Oral Daily  . gabapentin  100 mg Oral QHS  . gentamicin cream  1 application Topical Daily  . heparin injection (subcutaneous)  5,000 Units Subcutaneous Q8H  . hydrocortisone   Topical QID  . insulin aspart  0-6 Units Subcutaneous TID WC  . isosorbide dinitrate  10 mg Oral BID  . midodrine  5 mg Oral BID WC  . pantoprazole  40 mg Oral BID AC  . rosuvastatin  10 mg Oral Daily  . ticagrelor  90 mg Oral BID  . vancomycin variable dose per unstable renal function (pharmacist dosing)   Does not apply See admin instructions  . vitamin B-12  1,000 mcg Oral Daily    Dialysis  Orders: CCPD 6 exchanges 2.7Lfill.1.5h dwell No day dwell.(Patient says she has been doing 5 exchanges)  Assessment/Plan: 1. CAD/Unstable angina. Known multivessel disease. LHC 2/12 with progressive disease, occlusion of recent OM stent. Cardiology following - medical therapy for now- started on isosorbidewhich has helped CP.  Suspect hypotension was likely brewing sepsis/ COVID in retrospect, will decrease midodrine to 5 mg BID as Bps 140s-160s.   2. Sepsis/bacteremia: Febrile overnight 2/16. No signs of peritonitis, but studies ordered -> cell ct 19, Cx pending. CXR with new bibasilar opacities, ?pna. BCx drawn -> now 1 of 2 growing GPC coag neg staph, started on Vanc/Cefepime.  COVID +.  Getting decadron and remdesivir too 3. ESRD: Continue CCPD- changed to all 1.5% fluids as of 2/15 - follow.  Weights stable.   4. Hypokalemia: Improving. 5. Hypertension/volume: See above. On isosorbide + mido now. 6. Anemia - Hgb 11.5. No ESA needs. Follow trends. 7. Metabolic bone disease: Caremains low - ^ Phoslo to 2/meals and sensipar held. Will start calcitriol 0.44mg PO q day. 8. Nutrition:Continuepro-stat supplements while here. 9. GERD: on PPI  EMadelon LipsMD 03/11/2019, 10:03 AM  [facility] Pager: [phone]

## 2019-03-11 NOTE — Progress Notes (Signed)
Progress Note  Patient Name: TENNILLE Davis Date of Encounter: 03/11/2019  Primary Cardiologist: Skeet Latch, MD   Subjective   Feeling well.  No chest pain.  Breathing improving.   Inpatient Medications    Scheduled Meds: . aspirin EC  81 mg Oral Daily  . calcitRIOL  0.25 mcg Oral Daily  . calcium acetate  1,334 mg Oral TID WC  . dexamethasone (DECADRON) injection  6 mg Intravenous q1800  . feeding supplement (PRO-STAT SUGAR FREE 64)  30 mL Oral BID  . ferric citrate  420 mg Oral TID WC  . folic acid  1 mg Oral Daily  . gabapentin  100 mg Oral QHS  . gentamicin cream  1 application Topical Daily  . heparin injection (subcutaneous)  5,000 Units Subcutaneous Q8H  . hydrocortisone   Topical QID  . insulin aspart  0-6 Units Subcutaneous TID WC  . isosorbide dinitrate  10 mg Oral BID  . midodrine  5 mg Oral TID WC  . pantoprazole  40 mg Oral BID AC  . rosuvastatin  10 mg Oral Daily  . ticagrelor  90 mg Oral BID  . vancomycin variable dose per unstable renal function (pharmacist dosing)   Does not apply See admin instructions  . vitamin B-12  1,000 mcg Oral Daily   Continuous Infusions: . sodium chloride 10 mL/hr at 03/03/19 0800  . ceFEPime (MAXIPIME) IV Stopped (03/10/19 1900)  . remdesivir 100 mg in NS 100 mL Stopped (03/10/19 1900)   PRN Meds: acetaminophen, diclofenac Sodium, gentamicin cream, dianeal solution for CAPD/CCPD with heparin, dianeal solution for CAPD/CCPD with heparin, HYDROcodone-acetaminophen, morphine injection, nitroGLYCERIN, ondansetron (ZOFRAN) IV   Vital Signs    Vitals:   03/11/19 0400 03/11/19 0609 03/11/19 0632 03/11/19 0634  BP:  (!) 154/87    Pulse: (!) 59 62 (!) 56   Resp: 15 10 16    Temp:  (!) 97.5 F (36.4 C)    TempSrc:  Oral    SpO2: 90% 94% 93%   Weight:    127.9 kg  Height:        Intake/Output Summary (Last 24 hours) at 03/11/2019 0900 Last data filed at 03/11/2019 0600 Gross per 24 hour  Intake 12645 ml    Output 12795 ml  Net -150 ml   Last 3 Weights 03/11/2019 03/10/2019 03/09/2019  Weight (lbs) 281 lb 15.5 oz (No Data) 277 lb 5.4 oz  Weight (kg) 127.9 kg (No Data) 125.8 kg      Telemetry    Sinus rhythm.  No events  - Personally Reviewed  ECG    n/a - Personally Reviewed  Physical Exam   VS:  BP (!) 154/87 (BP Location: Right Arm)   Pulse (!) 56   Temp (!) 97.5 F (36.4 C) (Oral)   Resp 16   Ht 5' 5"  (1.651 m)   Wt 127.9 kg   SpO2 93%   BMI 46.92 kg/m  , BMI Body mass index is 46.92 kg/m. GENERAL:  Chronically ill-appearing.  No acute distress HEENT: Pupils equal round and reactive, fundi not visualized, oral mucosa unremarkable NECK:  No jugular venous distention, waveform within normal limits, carotid upstroke brisk and symmetric, no bruits, no thyromegaly LYMPHATICS:  No cervical adenopathy LUNGS:  Clear to auscultation bilaterally HEART:  RRR.  PMI not displaced or sustained,S1 and S2 within normal limits, no S3, no S4, no clicks, no rubs, no murmurs ABD:  Positive bowel sounds normal in frequency in pitch, no bruits, no  rebound, no guarding, no midline pulsatile mass, no hepatomegaly, no splenomegaly.  PD catheter. EXT:  2 plus pulses throughout, no edema, no cyanosis no clubbing SKIN:  No rashes no nodules NEURO:  Cranial nerves II through XII grossly intact, motor grossly intact throughout PSYCH:  Cognitively intact, oriented to person place and time   Labs    High Sensitivity Troponin:   Recent Labs  Lab 03/02/19 1720 03/02/19 1920 03/05/19 1134 03/06/19 1011 03/07/19 0427  TROPONINIHS 119* 132* 929* 2,454* 2,970*      Chemistry Recent Labs  Lab 03/08/19 0126 03/08/19 0126 03/09/19 0412 03/10/19 0303 03/11/19 0500  NA 133*   < > 133* 133* 132*  K 4.5   < > 4.2 4.6 3.9  CL 93*   < > 93* 94* 96*  CO2 20*   < > 20* 20* 19*  GLUCOSE 117*   < > 149* 188* 215*  BUN 46*   < > 47* 50* 53*  CREATININE 15.42*   < > 16.11* 17.06* 16.16*  CALCIUM 6.8*    < > 6.7* 7.0* 6.8*  PROT 6.5  --   --  6.4* 5.8*  ALBUMIN 2.7*   < > 2.6* 2.3* 2.1*  AST 33  --   --  23 18  ALT 19  --   --  15 13  ALKPHOS 108  --   --  97 83  BILITOT 0.6  --   --  0.6 0.5  GFRNONAA 2*   < > 2* 2* 2*  GFRAA 3*   < > 3* 2* 3*  ANIONGAP 20*   < > 20* 19* 17*   < > = values in this interval not displayed.     Hematology Recent Labs  Lab 03/09/19 0412 03/10/19 0303 03/11/19 0500  WBC 16.5* 11.0* 14.6*  RBC 3.64* 3.51* 3.62*  HGB 11.5* 11.0* 11.6*  HCT 37.2 35.5* 34.6*  MCV 102.2* 101.1* 95.6  MCH 31.6 31.3 32.0  MCHC 30.9 31.0 33.5  RDW 17.0* 17.0* 15.9*  PLT 299 273 267    BNPNo results for input(s): BNP, PROBNP in the last 168 hours.   DDimer  Recent Labs  Lab 03/10/19 0303 03/11/19 0500  DDIMER 2.70* 1.52*     Radiology    DG CHEST PORT 1 VIEW  Result Date: 03/09/2019 CLINICAL DATA:  Fever. EXAM: PORTABLE CHEST 1 VIEW COMPARISON:  03/08/2019. FINDINGS: Stable mild cardiomegaly. Mild bibasilar infiltrates with slightly improved aeration from prior exam. Tiny left pleural effusion cannot be excluded. No pneumothorax. IMPRESSION: 1.  Stable mild cardiomegaly. 2. Mild bibasilar infiltrates with slightly improved aeration from prior exam. Tiny left pleural effusion cannot be excluded. Electronically Signed   By: Marcello Moores  Register   On: 03/09/2019 10:41    Cardiac Studies   Cardiac Cath 03/03/19  Mid LAD lesion is 75% stenosed.  Ost LAD to Mid LAD lesion is 40% stenosed.  Previously placed Ost 1st Mrg drug eluting stent is widely patent.  Mid Cx to Dist Cx lesion is 55% stenosed with 40% stenosed side branch in Ost 3rd Mrg.  Previously placed Prox RCA drug eluting stent is widely patent.  Ost 1st Mrg to 1st Mrg lesion is 99% stenosed. Partially in the stent and partially distal to the stent- subacte stent thrombosis.  LV end diastolic pressure is normal.  There is no aortic valve stenosis. Medical therapy for stent thrombosis of the OM  stent. Given the severe tortuosity, I don't think repeat PCI attempt  would be beneficial. There was significant difficulty getting a stent delivered during the last cath. I think there would be a high likelihood of completely occluding the vessel with wire manipulation.   Diagnostic Dominance: Right   Dominance: Right   Patient Profile     54 y.o. female  with ESRD on PD, CAD status post recent PCI, hypertension, hyperlipidemia, diabetes, and cirrhosis admitted with NSTEMI.  Now with COVID-19 pneumonia.  Assessment & Plan    # NSTEMI: # Obstructive CAD:  # Hyperlipidemia:  Her OM stent is 99% occluded but not amenable to PCI due to tortuosity and location.  She has been medically managed by starting long-acting nitrates.  Midodrine was added and titrated, though in retrospect sepsis was likely contributing.  BP is now elevated.  Steroids also contributing.  Will reduce midodrine to 64m bid.  Continue aspirin, rosuvastatin, and isordil. Agree with increasing isordil.  She is chest pain free.  # Chronic diastolic heart failure: # ESRD on PD:  Volume status stable.  Nephrology following.   # COVID-19 pneumonia: # Sepsis: On remdesevir and steroids.  Improving clinically and on room air.   # QTc prolongation: Avoid QT prolonging agents.       For questions or updates, please contact [place]Please consult [email or web address] for contact info under        Signed, TSkeet Latch MD  03/11/2019, 9:00 AM

## 2019-03-12 ENCOUNTER — Inpatient Hospital Stay (HOSPITAL_COMMUNITY): Payer: Medicare Other

## 2019-03-12 LAB — COMPREHENSIVE METABOLIC PANEL
ALT: 14 U/L (ref 0–44)
AST: 19 U/L (ref 15–41)
Albumin: 2.2 g/dL — ABNORMAL LOW (ref 3.5–5.0)
Alkaline Phosphatase: 83 U/L (ref 38–126)
Anion gap: 19 — ABNORMAL HIGH (ref 5–15)
BUN: 51 mg/dL — ABNORMAL HIGH (ref 6–20)
CO2: 21 mmol/L — ABNORMAL LOW (ref 22–32)
Calcium: 7.3 mg/dL — ABNORMAL LOW (ref 8.9–10.3)
Chloride: 93 mmol/L — ABNORMAL LOW (ref 98–111)
Creatinine, Ser: 14.78 mg/dL — ABNORMAL HIGH (ref 0.44–1.00)
GFR calc Af Amer: 3 mL/min — ABNORMAL LOW (ref >60)
GFR calc non Af Amer: 2 mL/min — ABNORMAL LOW (ref >60)
Glucose, Bld: 281 mg/dL — ABNORMAL HIGH (ref 70–99)
Potassium: 3.6 mmol/L (ref 3.5–5.1)
Sodium: 133 mmol/L — ABNORMAL LOW (ref 135–145)
Total Bilirubin: 0.8 mg/dL (ref 0.3–1.2)
Total Protein: 5.9 g/dL — ABNORMAL LOW (ref 6.5–8.1)

## 2019-03-12 LAB — GLUCOSE, CAPILLARY
Glucose-Capillary: 261 mg/dL — ABNORMAL HIGH (ref 70–99)
Glucose-Capillary: 253 mg/dL — ABNORMAL HIGH (ref 70–99)
Glucose-Capillary: 204 mg/dL — ABNORMAL HIGH (ref 70–99)
Glucose-Capillary: 245 mg/dL — ABNORMAL HIGH (ref 70–99)

## 2019-03-12 LAB — CBC
HCT: 33.8 % — ABNORMAL LOW (ref 36.0–46.0)
Hemoglobin: 11.1 g/dL — ABNORMAL LOW (ref 12.0–15.0)
MCH: 31.6 pg (ref 26.0–34.0)
MCHC: 32.8 g/dL (ref 30.0–36.0)
MCV: 96.3 fL (ref 80.0–100.0)
Platelets: 294 10*3/uL (ref 150–400)
RBC: 3.51 MIL/uL — ABNORMAL LOW (ref 3.87–5.11)
RDW: 16 % — ABNORMAL HIGH (ref 11.5–15.5)
WBC: 12.6 10*3/uL — ABNORMAL HIGH (ref 4.0–10.5)
nRBC: 1 % — ABNORMAL HIGH (ref 0.0–0.2)

## 2019-03-12 LAB — CULTURE, BLOOD (ROUTINE X 2): Special Requests: ADEQUATE

## 2019-03-12 LAB — VANCOMYCIN, RANDOM: Vancomycin Rm: 19

## 2019-03-12 LAB — FERRITIN: Ferritin: 3606 ng/mL — ABNORMAL HIGH (ref 11–307)

## 2019-03-12 LAB — PROCALCITONIN: Procalcitonin: 16.21 ng/mL

## 2019-03-12 LAB — C-REACTIVE PROTEIN: CRP: 11.2 mg/dL — ABNORMAL HIGH (ref <1.0)

## 2019-03-12 LAB — D-DIMER, QUANTITATIVE: D-Dimer, Quant: 1.48 ug/mL-FEU — ABNORMAL HIGH (ref 0.00–0.50)

## 2019-03-12 MED ORDER — VANCOMYCIN HCL 1250 MG/250ML IV SOLN
1250.0000 mg | INTRAVENOUS | Status: DC
  Filled 2019-03-12: qty 250

## 2019-03-12 MED ORDER — VANCOMYCIN HCL 1250 MG/250ML IV SOLN
1250.0000 mg | Freq: Once | INTRAVENOUS | Status: AC
  Administered 2019-03-12: 11:00:00 1250 mg via INTRAVENOUS
  Filled 2019-03-12: qty 250

## 2019-03-12 MED ORDER — MIDODRINE HCL 5 MG PO TABS
2.5000 mg | ORAL_TABLET | Freq: Two times a day (BID) | ORAL | Status: AC
  Administered 2019-03-12 – 2019-03-13 (×3): 2.5 mg via ORAL
  Filled 2019-03-12 (×3): qty 1

## 2019-03-12 MED ORDER — DEXAMETHASONE SODIUM PHOSPHATE 4 MG/ML IJ SOLN
4.0000 mg | Freq: Every day | INTRAMUSCULAR | Status: DC
  Administered 2019-03-12: 18:00:00 4 mg via INTRAVENOUS
  Filled 2019-03-12: qty 1

## 2019-03-12 NOTE — Progress Notes (Addendum)
PROGRESS NOTE                                                                                                                                                                                                             Patient Demographics:    Shakeila Pfarr, is a 54 y.o. female, DOB - July 10, 1965, BTD:[id number]  Outpatient Primary MD for the patient is [name], MD   Admit date - 03/02/2019   LOS - 9  Chief Complaint  Patient presents with  . Chest Pain       Brief Narrative: Patient is a 54 y.o. female with PMHx of ESRD on PD, CAD s/p PCI, chronic diastolic heart failure, DM, HLD who presented to the ED on 2/11 with chest pain-found to have non-STEMI-underwent LHC-Per cardiology due to tortuosity and location of the vessel-not amenable for PCI-and medical management recommended.  Hospital course complicated by hypotension, and fever-subsequently found to have COVID-19.  See below for further details.   Subjective:   Lying comfortably in bed-denies any chest pain or shortness of breath.   Assessment  & Plan :   Non-STEMI-prior history of CAD s/p PCI 01/17/2019:  remains chest pain-free-per cardiology-due to location/tortuosity of culprit lesion-not a candidate for PCI-medical management recommended.  Continue aspirin/statin/Brilinta-since BP now somewhat stable-has been restarted on nitrates.  Midodrine being slowly tapered down.   Sepsis secondary to COVID-19 pneumonia and concurrent bacterial pneumonia: Sepsis physiology improving-on room air this morning-fever over the past few days likely from COVID-19 pneumonia.  Peritoneal dialysis cultures negative so far.  Blood cultures on 2/17-positive Staph capitis-awaiting sensitivities-spoke with Dr. Tommy Medal on 2/20-awaiting CT chest/abdomen.  Inflammatory markers/procalcitonin level slowly downtrending.  For now continue with steroids (but taper as no longer  hypoxic), remdesivir and vancomycin/cefepime.  Once we have complete work-up-we will reconsult ID again.  Fever: afebrile this morning  O2 requirements:  SpO2: 94 % O2 Flow Rate (L/min): 2 L/min   COVID-19 Labs: Recent Labs    03/10/19 0303 03/11/19 0500 03/12/19 0500  DDIMER 2.70* 1.52* 1.48*  FERRITIN 3,364* 3,877* 3,606*  CRP 29.3* 17.2* 11.2*       Component Value Date/Time   BNP 110.2 (H) 06/18/2018 2314    Recent Labs  Lab 03/09/19 0412 03/10/19 0303 03/11/19 0500 03/12/19 0500  PROCALCITON 16.09 21.54 20.91 16.21  Lab Results  Component Value Date   SARSCOV2NAA POSITIVE (A) 03/09/2019   [place] NEGATIVE 03/02/2019   [place] NEGATIVE 01/15/2019   [place] NEGATIVE 06/18/2018     COVID-19 Medications: Steroids: 2/18>> Remdesivir: 2/18>>  Antibiotics: Vancomycin:2/17>> Cefepime: 2/17>>  Prone/Incentive Spirometry: encouraged  incentive spirometry use 3-4/hour.  DVT Prophylaxis  :   Heparin   ESRD on peritoneal dialysis: Nephrology following and directing care  Hypotension: BP is stable-continue midodrine-but continue to taper.  Chronic diastolic heart failure: Stable volume status-diuresis with peritoneal dialysis.  DM-2 (A1c 7.21 December 2018): CBG stable-continue SSI.  Resume Metformin on discharge.  Recent Labs    03/11/19 1632 03/11/19 2132 03/12/19 0749  GLUCAP 226* 179* 261*   GERD: Continue PPI  Morbid Obesity: Estimated body mass index is 47.36 kg/m as calculated from the following:   Height as of this encounter: 5' 5"  (1.651 m).   Weight as of this encounter: 129.1 kg.    Consults  : Nephrology/cardiology  Procedures  :   Left heart catheterization on 2/12  ABG:    Component Value Date/Time   PHART 7.307 (L) 01/18/2019 2358   PCO2ART 50.4 (H) 01/18/2019 2358   PO2ART 84.0 01/18/2019 2358   HCO3 25.3 01/18/2019 2358   TCO2 27 01/18/2019 2358   ACIDBASEDEF 1.0 01/18/2019 2358   O2SAT 95.0 01/18/2019  2358    Vent Settings: N/A  Condition -Guarded  Family Communication  :  Spouse updated over the phone 2/21  Code Status :  Full Code  Diet :  Diet Order            Diet heart healthy/carb modified Room service appropriate? Yes; Fluid consistency: Thin  Diet effective now               Disposition Plan  :  Remain hospitalized  Barriers to discharge: Complete 5 days of IV Remdesivir/IV antibiotics  Antimicorbials  :    Anti-infectives (From admission, onward)   Start     Dose/Rate Route Frequency Ordered Stop   03/12/19 1200  vancomycin (VANCOREADY) IVPB 1250 mg/250 mL  Status:  Discontinued     1,250 mg 166.7 mL/hr over 90 Minutes Intravenous 4D (96 hours ) 03/12/19 0730 03/12/19 0731   03/12/19 0745  vancomycin (VANCOREADY) IVPB 1250 mg/250 mL     1,250 mg 166.7 mL/hr over 90 Minutes Intravenous  Once 03/12/19 0732     03/10/19 1000  remdesivir 100 mg in sodium chloride 0.9 % 100 mL IVPB     100 mg 200 mL/hr over 30 Minutes Intravenous Daily 03/09/19 1743 03/14/19 0959   03/09/19 1830  remdesivir 200 mg in sodium chloride 0.9% 250 mL IVPB     200 mg 580 mL/hr over 30 Minutes Intravenous Once 03/09/19 1743 03/09/19 2111   03/09/19 1800  ceFEPIme (MAXIPIME) 1 g in sodium chloride 0.9 % 100 mL IVPB     1 g 200 mL/hr over 30 Minutes Intravenous Every 24 hours 03/08/19 1122     03/08/19 1600  vancomycin (VANCOCIN) 2,500 mg in sodium chloride 0.9 % 500 mL IVPB     2,500 mg 250 mL/hr over 120 Minutes Intravenous  Once 03/08/19 1252 03/08/19 1712   03/08/19 1400  ceFEPIme (MAXIPIME) 2 g in sodium chloride 0.9 % 100 mL IVPB     2 g 200 mL/hr over 30 Minutes Intravenous  Once 03/08/19 1122 03/08/19 1654   03/08/19 1030  vancomycin (VANCOCIN) 2,500 mg in sodium chloride 0.9 % 500 mL IVPB  Status:  Discontinued     2,500 mg 250 mL/hr over 120 Minutes Intravenous  Once 03/08/19 0932 03/08/19 1510   03/08/19 0931  vancomycin variable dose per unstable renal function  (pharmacist dosing)      Does not apply See admin instructions 03/08/19 0932     03/08/19 0930  cefTRIAXone (ROCEPHIN) 1 g in sodium chloride 0.9 % 100 mL IVPB  Status:  Discontinued     1 g 200 mL/hr over 30 Minutes Intravenous  Once 03/08/19 0917 03/08/19 1126      Inpatient Medications  Scheduled Meds: . aspirin EC  81 mg Oral Daily  . calcitRIOL  0.25 mcg Oral Daily  . calcium acetate  1,334 mg Oral TID WC  . dexamethasone (DECADRON) injection  6 mg Intravenous q1800  . feeding supplement (PRO-STAT SUGAR FREE 64)  30 mL Oral BID  . ferric citrate  420 mg Oral TID WC  . folic acid  1 mg Oral Daily  . gabapentin  100 mg Oral QHS  . gentamicin cream  1 application Topical Daily  . heparin injection (subcutaneous)  5,000 Units Subcutaneous Q8H  . hydrocortisone   Topical QID  . insulin aspart  0-6 Units Subcutaneous TID WC  . isosorbide dinitrate  10 mg Oral BID  . midodrine  5 mg Oral BID WC  . pantoprazole  40 mg Oral BID AC  . rosuvastatin  10 mg Oral Daily  . ticagrelor  90 mg Oral BID  . vancomycin variable dose per unstable renal function (pharmacist dosing)   Does not apply See admin instructions  . vitamin B-12  1,000 mcg Oral Daily   Continuous Infusions: . sodium chloride 10 mL/hr at 03/03/19 0800  . ceFEPime (MAXIPIME) IV Stopped (03/11/19 1900)  . remdesivir 100 mg in NS 100 mL 100 mg (03/12/19 0900)  . vancomycin 1,250 mg (03/12/19 1053)   PRN Meds:.acetaminophen, diclofenac Sodium, gentamicin cream, dianeal solution for CAPD/CCPD with heparin, dianeal solution for CAPD/CCPD with heparin, HYDROcodone-acetaminophen, morphine injection, nitroGLYCERIN, ondansetron (ZOFRAN) IV   Time Spent in minutes  25   See all Orders from today for further details   Oren Binet M.D on 03/12/2019 at 11:20 AM  To page go to [email or web address] - use universal password  [facility] -  Office  [phone]    Objective:   Vitals:   03/12/19 0500 03/12/19 0600  03/12/19 0659 03/12/19 0813  BP: (!) 154/89   137/84  Pulse: (!) 58  62 63  Resp: 11   18  Temp: 98.4 F (36.9 C)     TempSrc: Oral     SpO2: 98%  94%   Weight:  129.1 kg    Height:        Wt Readings from Last 3 Encounters:  03/12/19 129.1 kg  02/17/19 125.6 kg  02/11/19 127.9 kg     Intake/Output Summary (Last 24 hours) at 03/12/2019 1120 Last data filed at 03/11/2019 2200 Gross per 24 hour  Intake 120 ml  Output --  Net 120 ml     Physical Exam Gen Exam:Alert awake-not in any distress HEENT:atraumatic, normocephalic Chest: B/L clear to auscultation anteriorly CVS:S1S2 regular Abdomen:soft non tender, non distended Extremities:no edema Neurology: Non focal Skin: no rash   Data Review:    CBC Recent Labs  Lab 03/08/19 0126 03/09/19 0412 03/10/19 0303 03/11/19 0500 03/12/19 0500  WBC 10.5 16.5* 11.0* 14.6* 12.6*  HGB 11.6* 11.5* 11.0* 11.6* 11.1*  HCT 37.2 37.2 35.5* 34.6* 33.8*  PLT 386 299 273 267 294  MCV 100.3* 102.2* 101.1* 95.6 96.3  MCH 31.3 31.6 31.3 32.0 31.6  MCHC 31.2 30.9 31.0 33.5 32.8  RDW 16.9* 17.0* 17.0* 15.9* 16.0*  LYMPHSABS 1.1  --   --   --   --   MONOABS 1.6*  --   --   --   --   EOSABS 0.1  --   --   --   --   BASOSABS 0.0  --   --   --   --     Chemistries  Recent Labs  Lab 03/08/19 0126 03/09/19 0412 03/10/19 0303 03/11/19 0500 03/12/19 0500  NA 133* 133* 133* 132* 133*  K 4.5 4.2 4.6 3.9 3.6  CL 93* 93* 94* 96* 93*  CO2 20* 20* 20* 19* 21*  GLUCOSE 117* 149* 188* 215* 281*  BUN 46* 47* 50* 53* 51*  CREATININE 15.42* 16.11* 17.06* 16.16* 14.78*  CALCIUM 6.8* 6.7* 7.0* 6.8* 7.3*  AST 33  --  23 18 19   ALT 19  --  15 13 14   ALKPHOS 108  --  97 83 83  BILITOT 0.6  --  0.6 0.5 0.8   ------------------------------------------------------------------------------------------------------------------ No results for input(s): CHOL, HDL, LDLCALC, TRIG, CHOLHDL, LDLDIRECT in the last 72 hours.  Lab Results  Component  Value Date   HGBA1C 7.2 (H) 01/17/2019   ------------------------------------------------------------------------------------------------------------------ No results for input(s): TSH, T4TOTAL, T3FREE, THYROIDAB in the last 72 hours.  Invalid input(s): FREET3 ------------------------------------------------------------------------------------------------------------------ Recent Labs    03/11/19 0500 03/12/19 0500  FERRITIN 3,877* 3,606*    Coagulation profile No results for input(s): INR, PROTIME in the last 168 hours.  Recent Labs    03/11/19 0500 03/12/19 0500  DDIMER 1.52* 1.48*    Cardiac Enzymes No results for input(s): CKMB, TROPONINI, MYOGLOBIN in the last 168 hours.  Invalid input(s): CK ------------------------------------------------------------------------------------------------------------------    Component Value Date/Time   BNP 110.2 (H) 06/18/2018 2314    Micro Results Recent Results (from the past 240 hour(s))  SARS CORONAVIRUS 2 (TAT 6-24 HRS) Nasopharyngeal Nasopharyngeal Swab     Status: None   Collection Time: 03/02/19 11:47 PM   Specimen: Nasopharyngeal Swab  Result Value Ref Range Status   SARS Coronavirus 2 NEGATIVE NEGATIVE Final    Comment: (NOTE) SARS-CoV-2 target nucleic acids are NOT DETECTED. The SARS-CoV-2 RNA is generally detectable in upper and lower respiratory specimens during the acute phase of infection. Negative results do not preclude SARS-CoV-2 infection, do not rule out co-infections with other pathogens, and should not be used as the sole basis for treatment or other patient management decisions. Negative results must be combined with clinical observations, patient history, and epidemiological information. The expected result is Negative. Fact Sheet for Patients: SugarRoll.be Fact Sheet for Healthcare Providers: https://www.woods-mathews.com/ This test is not yet approved or  cleared by the [place] FDA and  has been authorized for detection and/or diagnosis of SARS-CoV-2 by FDA under an Emergency Use Authorization (EUA). This EUA will remain  in effect (meaning this test can be used) for the duration of the COVID-19 declaration under Section 56 4(b)(1) of the Act, 21 U.S.C. section 360bbb-3(b)(1), unless the authorization is terminated or revoked sooner. Performed at [facility], [place] [address], [place], [place] [address]   Culture, blood (routine x 2)     Status: Abnormal   Collection Time: 03/08/19  1:15 AM   Specimen: BLOOD  Result Value Ref Range Status   Specimen Description  BLOOD RIGHT HAND  Final   Special Requests   Final    BOTTLES DRAWN AEROBIC AND ANAEROBIC Blood Culture adequate volume   Culture  Setup Time   Final    GRAM POSITIVE COCCI IN CLUSTERS ANAEROBIC BOTTLE ONLY CRITICAL RESULT CALLED TO, READ BACK BY AND VERIFIED WITH: Alona Bene PHARMD, AT [place] 03/09/19 BY D.VANHOOK Performed at [facility], [place] [address] [place], [place] [address]    Culture STAPHYLOCOCCUS CAPITIS (A)  Final   Report Status 03/12/2019 FINAL  Final   Organism ID, Bacteria STAPHYLOCOCCUS CAPITIS  Final      Susceptibility   Staphylococcus capitis - MIC*    CIPROFLOXACIN <=0.5 SENSITIVE Sensitive     ERYTHROMYCIN >=8 RESISTANT Resistant     GENTAMICIN <=0.5 SENSITIVE Sensitive     OXACILLIN <=0.25 SENSITIVE Sensitive     TETRACYCLINE <=1 SENSITIVE Sensitive     VANCOMYCIN <=0.5 SENSITIVE Sensitive     TRIMETH/SULFA <=10 SENSITIVE Sensitive     CLINDAMYCIN INTERMEDIATE Intermediate     RIFAMPIN <=0.5 SENSITIVE Sensitive     Inducible Clindamycin NEGATIVE Sensitive     * STAPHYLOCOCCUS CAPITIS  Culture, blood (routine x 2)     Status: None (Preliminary result)   Collection Time: 03/08/19  1:24 AM   Specimen: BLOOD  Result Value Ref Range Status   Specimen Description BLOOD RIGHT ARM  Final   Special Requests   Final    BOTTLES DRAWN AEROBIC  AND ANAEROBIC Blood Culture adequate volume Performed at [facility], [place] [address], [place], [place] [address]    Culture NO GROWTH 3 DAYS  Final   Report Status PENDING  Incomplete  Culture, body fluid-bottle     Status: None (Preliminary result)   Collection Time: 03/08/19  8:58 PM   Specimen: Peritoneal Dialysate  Result Value Ref Range Status   Specimen Description PERITONEAL DIALYSATE  Final   Special Requests BOTTLES DRAWN AEROBIC AND ANAEROBIC  Final   Culture NO GROWTH 3 DAYS  Final   Report Status PENDING  Incomplete  Gram stain     Status: None   Collection Time: 03/08/19  8:58 PM   Specimen: Peritoneal Dialysate  Result Value Ref Range Status   Specimen Description PERITONEAL DIALYSATE  Final   Special Requests NONE  Final   Gram Stain   Final    WBC PRESENT, PREDOMINANTLY MONONUCLEAR NO ORGANISMS SEEN CYTOSPIN SMEAR Performed at [facility], [place] [address] [place], [place] [address]    Report Status 03/08/2019 FINAL  Final  SARS CORONAVIRUS 2 (TAT 6-24 HRS) Nasopharyngeal Nasopharyngeal Swab     Status: Abnormal   Collection Time: 03/09/19 11:10 AM   Specimen: Nasopharyngeal Swab  Result Value Ref Range Status   SARS Coronavirus 2 POSITIVE (A) NEGATIVE Final    Comment: RESULT CALLED TO, READ BACK BY AND VERIFIED WITH: G COOPER,RN 1716 03/09/2019 D BRADLEY (NOTE) SARS-CoV-2 target nucleic acids are DETECTED. The SARS-CoV-2 RNA is generally detectable in upper and lower respiratory specimens during the acute phase of infection. Positive results are indicative of the presence of SARS-CoV-2 RNA. Clinical correlation with patient history and other diagnostic information is  necessary to determine patient infection status. Positive results do not rule out bacterial infection or co-infection with other viruses.  The expected result is Negative. Fact Sheet for Patients: SugarRoll.be Fact Sheet for Healthcare  Providers: https://www.woods-mathews.com/ This test is not yet approved or cleared by the [place] FDA and  has  been authorized for detection and/or diagnosis of SARS-CoV-2 by FDA under an Emergency Use Authorization (EUA). This EUA will remain  in effect (meaning this test can be used) for th e duration of the COVID-19 declaration under Section 564(b)(1) of the Act, 21 U.S.C. section 360bbb-3(b)(1), unless the authorization is terminated or revoked sooner. Performed at [facility], [place] [address] [place], [place] [address]   MRSA PCR Screening     Status: None   Collection Time: 03/10/19  7:32 AM   Specimen: Nasopharyngeal  Result Value Ref Range Status   MRSA by PCR NEGATIVE NEGATIVE Final    Comment:        The GeneXpert MRSA Assay (FDA approved for NASAL specimens only), is one component of a comprehensive MRSA colonization surveillance program. It is not intended to diagnose MRSA infection nor to guide or monitor treatment for MRSA infections. Performed at [facility], [place] [address] [place], [place] [address]     Radiology Reports CT ABDOMEN PELVIS WO CONTRAST  Result Date: 02/22/2019 CLINICAL DATA:  Persistent right-sided abdominal pain and palpable abnormality. Follow-up retroperitoneal hemorrhage. On peritoneal dialysis. EXAM: CT ABDOMEN AND PELVIS WITHOUT CONTRAST TECHNIQUE: Multidetector CT imaging of the abdomen and pelvis was performed following the standard protocol without IV contrast. COMPARISON:  01/20/2019 FINDINGS: Lower chest: No acute findings. Hepatobiliary: Hepatic cirrhosis again demonstrated. No hepatic mass visualized on this unenhanced exam. Prior cholecystectomy. No evidence of biliary obstruction. Pancreas: No mass or inflammatory process visualized on this unenhanced exam. Spleen:  Within normal limits in size. Adrenals/Urinary tract: No evidence of urolithiasis or hydronephrosis. Unremarkable unopacified urinary bladder.  A peritoneal dialysis catheter is again seen with tip in the pelvis a small amount of intraperitoneal air and fluid is again seen, which is attributable to the peritoneal dialysis. Stomach/Bowel: No evidence of obstruction, inflammatory process, or abnormal fluid collections. Diverticulosis is seen mainly involving the descending and sigmoid colon, however there is no evidence of diverticulitis. Vascular/Lymphatic: No pathologically enlarged lymph nodes identified. No evidence of abdominal aortic aneurysm. Aortic atherosclerosis incidentally noted. Reproductive:  No mass or other significant abnormality. Other: Interval decrease in right retroperitoneal hemorrhage is seen since previous study. A persistent retroperitoneal low-attenuation collection is seen in the right lower quadrant which measures 8.6 x 6.0 cm and likely represents a seroma. A smaller low-attenuation collection is now seen in the right groin which measures 4.2 x 2.4 cm and also likely represents a seroma. Musculoskeletal:  No suspicious bone lesions identified. IMPRESSION: 1. Interval decrease in right retroperitoneal hemorrhage since previous study. 2. Persistent low-attenuation collections in the right lower quadrant retroperitoneum and right groin, likely representing seromas. 3. Hepatic cirrhosis. 4. Mild ascites and free intraperitoneal air again seen, and attributable to peritoneal dialysis. 5. Colonic diverticulosis. No radiographic evidence of diverticulitis. Electronically Signed   By: Marlaine Hind M.D.   On: 02/22/2019 10:50   DG Chest 2 View  Result Date: 03/02/2019 CLINICAL DATA:  Chest pain EXAM: CHEST - 2 VIEW COMPARISON:  02/11/2019 FINDINGS: Heart and mediastinal contours are within normal limits. No focal opacities or effusions. No acute bony abnormality. IMPRESSION: No active cardiopulmonary disease. Electronically Signed   By: Rolm Baptise M.D.   On: 03/02/2019 17:40   DG Chest 2 View  Result Date: 02/11/2019 CLINICAL  DATA:  Chest pain EXAM: CHEST - 2 VIEW COMPARISON:  01/17/2019 FINDINGS: The heart size is stable from prior study. There is no pneumothorax. No large pleural effusion. No focal infiltrate.  There is no acute osseous abnormality. Aortic calcifications are noted. IMPRESSION: No active cardiopulmonary disease. Electronically Signed   By: Constance Holster M.D.   On: 02/11/2019 21:44   CARDIAC CATHETERIZATION  Result Date: 03/03/2019  Mid LAD lesion is 75% stenosed.  Ost LAD to Mid LAD lesion is 40% stenosed.  Previously placed Ost 1st Mrg drug eluting stent is widely patent.  Mid Cx to Dist Cx lesion is 55% stenosed with 40% stenosed side branch in Ost 3rd Mrg.  Previously placed Prox RCA drug eluting stent is widely patent.  Ost 1st Mrg to 1st Mrg lesion is 99% stenosed. Partially in the stent and partially distal to the stent- subacte stent thrombosis.  LV end diastolic pressure is normal.  There is no aortic valve stenosis.  Medical therapy for stent thrombosis of the OM stent.  Given the severe tortuosity, I don't think repeat PCI attempt would be beneficial.  There was significant difficulty getting a stent delivered during the last cath.  I think there would be a high likelihood of completely occluding the vessel with wire manipulation. .   DG CHEST PORT 1 VIEW  Result Date: 03/09/2019 CLINICAL DATA:  Fever. EXAM: PORTABLE CHEST 1 VIEW COMPARISON:  03/08/2019. FINDINGS: Stable mild cardiomegaly. Mild bibasilar infiltrates with slightly improved aeration from prior exam. Tiny left pleural effusion cannot be excluded. No pneumothorax. IMPRESSION: 1.  Stable mild cardiomegaly. 2. Mild bibasilar infiltrates with slightly improved aeration from prior exam. Tiny left pleural effusion cannot be excluded. Electronically Signed   By: Marcello Moores  Register   On: 03/09/2019 10:41   DG Chest Port 1 View  Result Date: 03/08/2019 CLINICAL DATA:  Fever, cough. EXAM: PORTABLE CHEST 1 VIEW COMPARISON:  03/02/2019.  FINDINGS: Trachea is midline. Heart size is accentuated by AP semi upright technique and low lung volumes. Thoracic aorta is calcified. Mild bibasilar airspace opacification, new from 03/02/2019. No definite pleural fluid. IMPRESSION: 1. New mild bibasilar airspace opacification may be due in part to lower lung volumes than on 03/02/2019. The possibility of pneumonia, including due to COVID-19, is also considered. 2.  Aortic atherosclerosis (ICD10-I70.0). Electronically Signed   By: Lorin Picket M.D.   On: 03/08/2019 09:23

## 2019-03-12 NOTE — Progress Notes (Addendum)
[facility] Progress Note   Subjective:  CT C/A/P pending this AM.. PD without issues overnight  Objective Vitals:   03/12/19 0500 03/12/19 0600 03/12/19 0659 03/12/19 0813  BP: (!) 154/89   137/84  Pulse: (!) 58  62 63  Resp: 11   18  Temp: 98.4 F (36.9 C)     TempSrc: Oral     SpO2: 98%  94%   Weight:  129.1 kg    Height:       Physical Exam General: NAD, seen getting wheeled back from CT C/A/P Heart: RRR; no murmur Lungs: CTAB; no c/w/r Abdomen: soft, non-tender Extremities: No LE edema Dialysis Access:  PD cath present   Additional Objective Labs: Basic Metabolic Panel: Recent Labs  Lab 03/08/19 0126 03/08/19 0126 03/09/19 0412 03/09/19 0412 03/10/19 0303 03/11/19 0500 03/12/19 0500  NA 133*   < > 133*   < > 133* 132* 133*  K 4.5   < > 4.2   < > 4.6 3.9 3.6  CL 93*   < > 93*   < > 94* 96* 93*  CO2 20*   < > 20*   < > 20* 19* 21*  GLUCOSE 117*   < > 149*   < > 188* 215* 281*  BUN 46*   < > 47*   < > 50* 53* 51*  CREATININE 15.42*   < > 16.11*   < > 17.06* 16.16* 14.78*  CALCIUM 6.8*   < > 6.7*   < > 7.0* 6.8* 7.3*  PHOS 4.0  --  5.4*  --   --   --   --    < > = values in this interval not displayed.   Liver Function Tests: Recent Labs  Lab 03/10/19 0303 03/11/19 0500 03/12/19 0500  AST 23 18 19   ALT 15 13 14   ALKPHOS 97 83 83  BILITOT 0.6 0.5 0.8  PROT 6.4* 5.8* 5.9*  ALBUMIN 2.3* 2.1* 2.2*   CBC: Recent Labs  Lab 03/08/19 0126 03/08/19 0126 03/09/19 0412 03/09/19 0412 03/10/19 0303 03/11/19 0500 03/12/19 0500  WBC 10.5   < > 16.5*   < > 11.0* 14.6* 12.6*  NEUTROABS 7.4  --   --   --   --   --   --   HGB 11.6*   < > 11.5*   < > 11.0* 11.6* 11.1*  HCT 37.2   < > 37.2   < > 35.5* 34.6* 33.8*  MCV 100.3*  --  102.2*  --  101.1* 95.6 96.3  PLT 386   < > 299   < > 273 267 294   < > = values in this interval not displayed.   Blood Culture    Component Value Date/Time   SDES PERITONEAL DIALYSATE 03/08/2019 2058   SDES  PERITONEAL DIALYSATE 03/08/2019 2058   [place] AND ANAEROBIC 03/08/2019 2058   [place] NONE 03/08/2019 2058   CULT NO GROWTH 3 DAYS 03/08/2019 2058   REPTSTATUS PENDING 03/08/2019 2058   REPTSTATUS 03/08/2019 FINAL 03/08/2019 2058   Studies/Results: CT ABDOMEN PELVIS WO CONTRAST  Result Date: 03/12/2019 CLINICAL DATA:  Cough, chest pain, sepsis EXAM: CT CHEST, ABDOMEN AND PELVIS WITHOUT CONTRAST TECHNIQUE: Multidetector CT imaging of the chest, abdomen and pelvis was performed following the standard protocol without IV contrast. COMPARISON:  Chest radiograph dated 03/09/2019 and CT abdomen pelvis dated 02/22/2019 FINDINGS: CT CHEST FINDINGS Cardiovascular: Vascular calcifications are seen in the coronary arteries  and aortic arch. Normal heart size. No pericardial effusion. Mediastinum/Nodes: No enlarged mediastinal, hilar, or axillary lymph nodes. Thyroid gland, trachea, and esophagus demonstrate no significant findings. Lungs/Pleura: Mild scattered bilateral ground-glass opacities are seen in the bilateral lower lobes and in the left upper lobe. There is no pleural effusion or pneumothorax. Musculoskeletal: No chest wall mass or suspicious bone lesions identified. CT ABDOMEN PELVIS FINDINGS Hepatobiliary: The liver has a nodular surface contour, consistent with hepatic cirrhosis. No focal abnormality is seen. The patient is status post cholecystectomy. No biliary dilatation is seen. Pancreas: Unremarkable. No pancreatic ductal dilatation or surrounding inflammatory changes. Spleen: Normal in size without focal abnormality. Adrenals/Urinary Tract: Adrenal glands are unremarkable. Kidneys are atrophic, without renal calculi, focal lesion, or hydronephrosis. Bladder is empty. A peritoneal dialysis catheter is seen in the lower central abdomen. 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 There is a small hiatal hernia. Enteric contrast reaches the distal small  bowel. Appendix appears normal. No evidence of bowel wall thickening, distention, or inflammatory changes. Vascular/Lymphatic: Aortic atherosclerosis. No enlarged abdominal or pelvic lymph nodes. Reproductive: Uterus and bilateral adnexa are unremarkable. Other: Moderate volume free intraperitoneal fluid and a small locules of free intraperitoneal air are seen. These are likely related to peritoneal dialysis. A right retroperitoneal hematoma/seroma measures 7.9 x 6.7 x 4.9 cm, decreased since 02/22/2019 when it measured 9.8 x 8.6 x 6.1 cm. A hematoma/seroma in the right groin measures 4.2 cm in greatest dimension in the sagittal plane, decreased from 02/22/2019 when it measured 5.6 cm. Musculoskeletal: Mild degenerative changes are seen in the lumbar spine. IMPRESSION: 1. Mild scattered bilateral ground-glass opacities in the bilateral lower lobes and left upper lobe likely represent pneumonia. 2. Hepatic cirrhosis. 3. Moderate volume free intraperitoneal fluid and small locules of free intraperitoneal air, likely related to peritoneal dialysis and or hepatic cirrhosis. 4. Continued interval decreased size of fluid collections in the right retroperitoneum and right groin which represent chronic hematoma/seroma. Aortic Atherosclerosis (ICD10-I70.0). Electronically Signed   By: Zerita Boers M.D.   On: 03/12/2019 12:01   CT CHEST WO CONTRAST  Result Date: 03/12/2019 CLINICAL DATA:  Cough, chest pain, sepsis EXAM: CT CHEST, ABDOMEN AND PELVIS WITHOUT CONTRAST TECHNIQUE: Multidetector CT imaging of the chest, abdomen and pelvis was performed following the standard protocol without IV contrast. COMPARISON:  Chest radiograph dated 03/09/2019 and CT abdomen pelvis dated 02/22/2019 FINDINGS: CT CHEST FINDINGS Cardiovascular: Vascular calcifications are seen in the coronary arteries and aortic arch. Normal heart size. No pericardial effusion. Mediastinum/Nodes: No enlarged mediastinal, hilar, or axillary lymph nodes.  Thyroid gland, trachea, and esophagus demonstrate no significant findings. Lungs/Pleura: Mild scattered bilateral ground-glass opacities are seen in the bilateral lower lobes and in the left upper lobe. There is no pleural effusion or pneumothorax. Musculoskeletal: No chest wall mass or suspicious bone lesions identified. CT ABDOMEN PELVIS FINDINGS Hepatobiliary: The liver has a nodular surface contour, consistent with hepatic cirrhosis. No focal abnormality is seen. The patient is status post cholecystectomy. No biliary dilatation is seen. Pancreas: Unremarkable. No pancreatic ductal dilatation or surrounding inflammatory changes. Spleen: Normal in size without focal abnormality. Adrenals/Urinary Tract: Adrenal glands are unremarkable. Kidneys are atrophic, without renal calculi, focal lesion, or hydronephrosis. Bladder is empty. A peritoneal dialysis catheter is seen in the lower central abdomen. 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 There is a small hiatal hernia. Enteric contrast reaches the distal small bowel. Appendix appears normal. No evidence of bowel wall thickening, distention, or inflammatory changes.  Vascular/Lymphatic: Aortic atherosclerosis. No enlarged abdominal or pelvic lymph nodes. Reproductive: Uterus and bilateral adnexa are unremarkable. Other: Moderate volume free intraperitoneal fluid and a small locules of free intraperitoneal air are seen. These are likely related to peritoneal dialysis. A right retroperitoneal hematoma/seroma measures 7.9 x 6.7 x 4.9 cm, decreased since 02/22/2019 when it measured 9.8 x 8.6 x 6.1 cm. A hematoma/seroma in the right groin measures 4.2 cm in greatest dimension in the sagittal plane, decreased from 02/22/2019 when it measured 5.6 cm. Musculoskeletal: Mild degenerative changes are seen in the lumbar spine. IMPRESSION: 1. Mild scattered bilateral ground-glass opacities in the bilateral lower lobes and left upper lobe likely represent  pneumonia. 2. Hepatic cirrhosis. 3. Moderate volume free intraperitoneal fluid and small locules of free intraperitoneal air, likely related to peritoneal dialysis and or hepatic cirrhosis. 4. Continued interval decreased size of fluid collections in the right retroperitoneum and right groin which represent chronic hematoma/seroma. Aortic Atherosclerosis (ICD10-I70.0). Electronically Signed   By: Zerita Boers M.D.   On: 03/12/2019 12:01   Medications: . sodium chloride 10 mL/hr at 03/03/19 0800  . ceFEPime (MAXIPIME) IV Stopped (03/11/19 1900)  . remdesivir 100 mg in NS 100 mL 100 mg (03/12/19 0900)  . vancomycin 1,250 mg (03/12/19 1053)   . aspirin EC  81 mg Oral Daily  . calcitRIOL  0.25 mcg Oral Daily  . calcium acetate  1,334 mg Oral TID WC  . dexamethasone (DECADRON) injection  4 mg Intravenous q1800  . feeding supplement (PRO-STAT SUGAR FREE 64)  30 mL Oral BID  . ferric citrate  420 mg Oral TID WC  . folic acid  1 mg Oral Daily  . gabapentin  100 mg Oral QHS  . gentamicin cream  1 application Topical Daily  . heparin injection (subcutaneous)  5,000 Units Subcutaneous Q8H  . hydrocortisone   Topical QID  . insulin aspart  0-6 Units Subcutaneous TID WC  . isosorbide dinitrate  10 mg Oral BID  . midodrine  2.5 mg Oral BID WC  . pantoprazole  40 mg Oral BID AC  . rosuvastatin  10 mg Oral Daily  . ticagrelor  90 mg Oral BID  . vancomycin variable dose per unstable renal function (pharmacist dosing)   Does not apply See admin instructions  . vitamin B-12  1,000 mcg Oral Daily    Dialysis Orders: CCPD 6 exchanges 2.7Lfill.1.5h dwell No day dwell.(Patient says she has been doing 5 exchanges)  Assessment/Plan: 1. CAD/Unstable angina. Known multivessel disease. LHC 2/12 with progressive disease, occlusion of recent OM stent. Cardiology following - medical therapy for now- started on isosorbidewhich has helped CP.  Suspect hypotension was likely brewing sepsis/ COVID in  retrospect, tapering midodrine.   2. Sepsis/bacteremia: Febrile overnight 2/16. No signs of peritonitis, but studies ordered -> cell ct 19, Cx pending. CXR with new bibasilar opacities, ?pna. BCx drawn staph capitis. CT C/A/P recommended by ID. started on Vanc/Cefepime.  COVID +.  Getting decadron and remdesivir too 3. ESRD: Continue CCPD- weights coming back up, will do mix of 1.5 and 2.5%.   4. Hypokalemia: Improving. 5. Hypertension/volume: See above. On isosorbide + mido now. 6. Anemia - Hgb 11.5. No ESA needs. Follow trends. 7. Metabolic bone disease: [place] low - ^ Phoslo to 2/meals and sensipar held. Will start calcitriol 0.75mg PO q day. 8. Nutrition:Continuepro-stat supplements while here. 9. GERD: on PPI  EMadelon LipsMD 03/12/2019, 12:08 PM  [facility] Pager: [phone]

## 2019-03-12 NOTE — Progress Notes (Signed)
Progress Note  Patient Name: Brenda Davis Date of Encounter: 03/12/2019  Primary Cardiologist: Skeet Latch, MD   Subjective   Feeling well.  No chest pain.  Breathing continues to improve.   Inpatient Medications    Scheduled Meds: . aspirin EC  81 mg Oral Daily  . calcitRIOL  0.25 mcg Oral Daily  . calcium acetate  1,334 mg Oral TID WC  . dexamethasone (DECADRON) injection  6 mg Intravenous q1800  . feeding supplement (PRO-STAT SUGAR FREE 64)  30 mL Oral BID  . ferric citrate  420 mg Oral TID WC  . folic acid  1 mg Oral Daily  . gabapentin  100 mg Oral QHS  . gentamicin cream  1 application Topical Daily  . heparin injection (subcutaneous)  5,000 Units Subcutaneous Q8H  . hydrocortisone   Topical QID  . insulin aspart  0-6 Units Subcutaneous TID WC  . isosorbide dinitrate  10 mg Oral BID  . midodrine  5 mg Oral BID WC  . pantoprazole  40 mg Oral BID AC  . rosuvastatin  10 mg Oral Daily  . ticagrelor  90 mg Oral BID  . vancomycin variable dose per unstable renal function (pharmacist dosing)   Does not apply See admin instructions  . vitamin B-12  1,000 mcg Oral Daily   Continuous Infusions: . sodium chloride 10 mL/hr at 03/03/19 0800  . ceFEPime (MAXIPIME) IV Stopped (03/11/19 1900)  . remdesivir 100 mg in NS 100 mL Stopped (03/11/19 1900)  . vancomycin     PRN Meds: acetaminophen, diclofenac Sodium, gentamicin cream, dianeal solution for CAPD/CCPD with heparin, dianeal solution for CAPD/CCPD with heparin, HYDROcodone-acetaminophen, morphine injection, nitroGLYCERIN, ondansetron (ZOFRAN) IV   Vital Signs    Vitals:   03/12/19 0310 03/12/19 0500 03/12/19 0600 03/12/19 0659  BP:  (!) 154/89    Pulse: (!) 54 (!) 58  62  Resp: 11 11    Temp:  98.4 F (36.9 C)    TempSrc:  Oral    SpO2: 97% 98%  94%  Weight:   129.1 kg   Height:        Intake/Output Summary (Last 24 hours) at 03/12/2019 0818 Last data filed at 03/11/2019 2200 Gross per 24 hour   Intake 240 ml  Output --  Net 240 ml   Last 3 Weights 03/12/2019 03/11/2019 03/11/2019  Weight (lbs) 284 lb 9.8 oz 273 lb 9.5 oz 281 lb 15.5 oz  Weight (kg) 129.1 kg 124.1 kg 127.9 kg      Telemetry    Sinus rhythm.  No events  - Personally Reviewed  ECG    n/a - Personally Reviewed  Physical Exam   VS:  BP (!) 154/89 (BP Location: Right Arm)   Pulse 62   Temp 98.4 F (36.9 C) (Oral)   Resp 11   Ht 5' 5"  (1.651 m)   Wt 129.1 kg   SpO2 94%   BMI 47.36 kg/m  , BMI Body mass index is 47.36 kg/m. GENERAL:  Chronically ill-appearing.  No acute distress HEENT: Pupils equal round and reactive, fundi not visualized, oral mucosa unremarkable NECK:  No jugular venous distention, waveform within normal limits, carotid upstroke brisk and symmetric, no bruits LUNGS:  Clear to auscultation bilaterally HEART:  RRR.  PMI not displaced or sustained,S1 and S2 within normal limits, no S3, no S4, no clicks, no rubs, no murmurs ABD:  Positive bowel sounds normal in frequency in pitch, no bruits, no rebound, no guarding,  no midline pulsatile mass, no hepatomegaly, no splenomegaly.  PD catheter. EXT:  2 plus pulses throughout, no edema, no cyanosis no clubbing SKIN:  No rashes no nodules NEURO:  Cranial nerves II through XII grossly intact, motor grossly intact throughout PSYCH:  Cognitively intact, oriented to person place and time   Labs    High Sensitivity Troponin:   Recent Labs  Lab 03/02/19 1720 03/02/19 1920 03/05/19 1134 03/06/19 1011 03/07/19 0427  TROPONINIHS 119* 132* 929* 2,454* 2,970*      Chemistry Recent Labs  Lab 03/10/19 0303 03/11/19 0500 03/12/19 0500  NA 133* 132* 133*  K 4.6 3.9 3.6  CL 94* 96* 93*  CO2 20* 19* 21*  GLUCOSE 188* 215* 281*  BUN 50* 53* 51*  CREATININE 17.06* 16.16* 14.78*  CALCIUM 7.0* 6.8* 7.3*  PROT 6.4* 5.8* 5.9*  ALBUMIN 2.3* 2.1* 2.2*  AST 23 18 19   ALT 15 13 14   ALKPHOS 97 83 83  BILITOT 0.6 0.5 0.8  GFRNONAA 2* 2* 2*   GFRAA 2* 3* 3*  ANIONGAP 19* 17* 19*     Hematology Recent Labs  Lab 03/10/19 0303 03/11/19 0500 03/12/19 0500  WBC 11.0* 14.6* 12.6*  RBC 3.51* 3.62* 3.51*  HGB 11.0* 11.6* 11.1*  HCT 35.5* 34.6* 33.8*  MCV 101.1* 95.6 96.3  MCH 31.3 32.0 31.6  MCHC 31.0 33.5 32.8  RDW 17.0* 15.9* 16.0*  PLT 273 267 294    BNPNo results for input(s): BNP, PROBNP in the last 168 hours.   DDimer  Recent Labs  Lab 03/10/19 0303 03/11/19 0500 03/12/19 0500  DDIMER 2.70* 1.52* 1.48*     Radiology    No results found.  Cardiac Studies   Cardiac Cath 03/03/19  Mid LAD lesion is 75% stenosed.  Ost LAD to Mid LAD lesion is 40% stenosed.  Previously placed Ost 1st Mrg drug eluting stent is widely patent.  Mid Cx to Dist Cx lesion is 55% stenosed with 40% stenosed side branch in Ost 3rd Mrg.  Previously placed Prox RCA drug eluting stent is widely patent.  Ost 1st Mrg to 1st Mrg lesion is 99% stenosed. Partially in the stent and partially distal to the stent- subacte stent thrombosis.  LV end diastolic pressure is normal.  There is no aortic valve stenosis. Medical therapy for stent thrombosis of the OM stent. Given the severe tortuosity, I don't think repeat PCI attempt would be beneficial. There was significant difficulty getting a stent delivered during the last cath. I think there would be a high likelihood of completely occluding the vessel with wire manipulation.   Diagnostic Dominance: Right   Dominance: Right   Patient Profile     54 y.o. female  with ESRD on PD, CAD status post recent PCI, hypertension, hyperlipidemia, diabetes, and cirrhosis admitted with NSTEMI.  Now with COVID-19 pneumonia.  Assessment & Plan    # NSTEMI: # Obstructive CAD:  # Hyperlipidemia:  Her OM stent is 99% occluded but not amenable to PCI due to tortuosity and location.  She has been medically managed and is tolerating nitrates.  Midodrine was added and titrated, though in  retrospect sepsis was likely contributing.  BP stable.  Continue midodrine for now.  BP will likely decrease as steroids are tapered.  If BP remains stable after steroids, may be able to d/c midodrine. Continue aspirin, rosuvastatin, and isordil.   # Chronic diastolic heart failure: # ESRD on PD:  Volume status stable.  Nephrology following.   #  COVID-19 pneumonia: # Sepsis: On remdesevir and steroids.  Improving clinically and on room air.  Receiving antibiotics for Staph capitus from blood cultures 2/17.  ID following.  # QTc prolongation: Avoid QT prolonging agents.   [facility] will sign off.   Medication Recommendations:  Taper midodrine as tolerated Other recommendations (labs, testing, etc):  none Follow up as an outpatient:  We will arrange      For questions or updates, please contact [place] Please consult [email or web address] for contact info under        Signed, Skeet Latch, MD  03/12/2019, 8:18 AM

## 2019-03-12 NOTE — Progress Notes (Addendum)
PD treatment complete. This RN will contact dialysis to disconnect patient. PO contrast provided for patient in cups of ice. This RN educated patient, and encouraged her to drink as much as possible. Will continue to monitor.

## 2019-03-12 NOTE — Progress Notes (Signed)
Pharmacy Antibiotic Note  Brenda Davis is a 54 y.o. female admitted on 03/02/2019 with chest pain.  Pharmacy has been consulted for vancomycin and cefepime dosing for sepsis of unknown source. Blood cultures positive 1/4 staphylococcus capitis, but could be contaminant. Of note, patient has ESRD on CCPD PTA and continuing while in hospital.   Day #5 of antibiotics. Patient is afebrile. WBC trending down 14.6>12.6. Procalcitonin has been elevated 6.71>>20.91>16.21. Use of procalcitonin in this patient population is controversial.   Vancomycin random level this morning is 19. Will re-dose since level is now <20.   Vancomycin 2/17>> Cefepime 2/17>>  2/17 Peritoneal dialysate fluid Cx: no growth x3 days 2/17 Blood Cx: S. capitis 1/4 bottles - pending sensitivities  Plan: - Give vancomycin 1250 mg IV x1 today, interval will likely be q4d - Continue cefepime 1g IV q24hr - F/u length of therapy plans, repeat c/s, S. capitis sensitivities  Height: 5' 5"  (165.1 cm) Weight: 284 lb 9.8 oz (129.1 kg) IBW/kg (Calculated) : 57  Temp (24hrs), Avg:98.1 F (36.7 C), Min:97.6 F (36.4 C), Max:98.8 F (37.1 C)  Recent Labs  Lab 03/08/19 0126 03/09/19 0412 03/10/19 0303 03/11/19 0500 03/12/19 0500  WBC 10.5 16.5* 11.0* 14.6* 12.6*  CREATININE 15.42* 16.11* 17.06* 16.16* 14.78*  LATICACIDVEN 1.4  --   --   --   --   VANCORANDOM  --   --   --  23 19    Estimated Creatinine Clearance: 5.9 mL/min (A) (by C-G formula based on SCr of 14.78 mg/dL (H)).    Allergies  Allergen Reactions  . Amlodipine Swelling  . Atorvastatin     Elevated LFT's  . Clonidine Derivatives Swelling    Limbs swell  . Doxycycline Nausea And Vomiting    "I threw up for 3 hours"  . Welchol [Colesevelam Hcl] Nausea Only    Thank you for allowing pharmacy to be a part of this patient's care.  Agnes Lawrence, PharmD PGY1 Pharmacy Resident

## 2019-03-12 NOTE — Progress Notes (Signed)
Patient A&O x4. Patient on phone with family/friends multiple times. Patient given status update. Questions answered. Patient verbalized understanding. Patient pleasant and cooperative with assessment and meds. Patient does not want an HS snack. Patient repositioning self at this time. C/o very dry skin, so this RN gave patient unit moisturizing cream. Patient,also, c/o abd. Pain at times when laying supine. Patient states she had a hard BM today after not having a BM for 7 days. Patient does not want stool softener/laxative at this time. Patient states that she will attempt to lay on her side. This was encouraged since side-lying is beneficial for Covid patients as well. Patient resting in bed at this time, bed alarm on and call light within reach. Will continue to monitor.

## 2019-03-13 LAB — GLUCOSE, CAPILLARY
Glucose-Capillary: 253 mg/dL — ABNORMAL HIGH (ref 70–99)
Glucose-Capillary: 182 mg/dL — ABNORMAL HIGH (ref 70–99)
Glucose-Capillary: 183 mg/dL — ABNORMAL HIGH (ref 70–99)
Glucose-Capillary: 252 mg/dL — ABNORMAL HIGH (ref 70–99)

## 2019-03-13 LAB — COMPREHENSIVE METABOLIC PANEL
ALT: 13 U/L (ref 0–44)
AST: 19 U/L (ref 15–41)
Albumin: 2.3 g/dL — ABNORMAL LOW (ref 3.5–5.0)
Alkaline Phosphatase: 76 U/L (ref 38–126)
Anion gap: 20 — ABNORMAL HIGH (ref 5–15)
BUN: 49 mg/dL — ABNORMAL HIGH (ref 6–20)
CO2: 21 mmol/L — ABNORMAL LOW (ref 22–32)
Calcium: 7.6 mg/dL — ABNORMAL LOW (ref 8.9–10.3)
Chloride: 91 mmol/L — ABNORMAL LOW (ref 98–111)
Creatinine, Ser: 13.66 mg/dL — ABNORMAL HIGH (ref 0.44–1.00)
GFR calc Af Amer: 3 mL/min — ABNORMAL LOW (ref >60)
GFR calc non Af Amer: 3 mL/min — ABNORMAL LOW (ref >60)
Glucose, Bld: 311 mg/dL — ABNORMAL HIGH (ref 70–99)
Potassium: 3.5 mmol/L (ref 3.5–5.1)
Sodium: 132 mmol/L — ABNORMAL LOW (ref 135–145)
Total Bilirubin: 0.4 mg/dL (ref 0.3–1.2)
Total Protein: 5.8 g/dL — ABNORMAL LOW (ref 6.5–8.1)

## 2019-03-13 LAB — C-REACTIVE PROTEIN: CRP: 7.8 mg/dL — ABNORMAL HIGH (ref <1.0)

## 2019-03-13 LAB — CBC
HCT: 32.8 % — ABNORMAL LOW (ref 36.0–46.0)
Hemoglobin: 11 g/dL — ABNORMAL LOW (ref 12.0–15.0)
MCH: 31.6 pg (ref 26.0–34.0)
MCHC: 33.5 g/dL (ref 30.0–36.0)
MCV: 94.3 fL (ref 80.0–100.0)
Platelets: 297 10*3/uL (ref 150–400)
RBC: 3.48 MIL/uL — ABNORMAL LOW (ref 3.87–5.11)
RDW: 16 % — ABNORMAL HIGH (ref 11.5–15.5)
WBC: 12.6 10*3/uL — ABNORMAL HIGH (ref 4.0–10.5)
nRBC: 1.2 % — ABNORMAL HIGH (ref 0.0–0.2)

## 2019-03-13 LAB — CULTURE, BLOOD (ROUTINE X 2)
Culture: NO GROWTH
Special Requests: ADEQUATE

## 2019-03-13 LAB — CULTURE, BODY FLUID W GRAM STAIN -BOTTLE: Culture: NO GROWTH

## 2019-03-13 LAB — D-DIMER, QUANTITATIVE: D-Dimer, Quant: 1.88 ug/mL-FEU — ABNORMAL HIGH (ref 0.00–0.50)

## 2019-03-13 LAB — FERRITIN: Ferritin: 3492 ng/mL — ABNORMAL HIGH (ref 11–307)

## 2019-03-13 MED ORDER — DOXYCYCLINE HYCLATE 100 MG PO TABS
100.0000 mg | ORAL_TABLET | Freq: Two times a day (BID) | ORAL | Status: DC
  Administered 2019-03-13 – 2019-03-14 (×3): 100 mg via ORAL
  Filled 2019-03-13 (×3): qty 1

## 2019-03-13 MED ORDER — DELFLEX-LC/2.5% DEXTROSE 394 MOSM/L IP SOLN
INTRAPERITONEAL | Status: DC
  Administered 2019-03-13: 19:00:00 5000 mL via INTRAPERITONEAL

## 2019-03-13 MED ORDER — SACCHAROMYCES BOULARDII 250 MG PO CAPS
250.0000 mg | ORAL_CAPSULE | Freq: Two times a day (BID) | ORAL | Status: DC
  Administered 2019-03-13 – 2019-03-14 (×2): 250 mg via ORAL
  Filled 2019-03-13 (×2): qty 1

## 2019-03-13 MED ORDER — DELFLEX-LC/1.5% DEXTROSE 344 MOSM/L IP SOLN
INTRAPERITONEAL | Status: DC
  Administered 2019-03-13: 5000 mL via INTRAPERITONEAL

## 2019-03-13 MED ORDER — DEXAMETHASONE SODIUM PHOSPHATE 4 MG/ML IJ SOLN
2.0000 mg | Freq: Every day | INTRAMUSCULAR | Status: AC
  Administered 2019-03-13: 17:00:00 2 mg via INTRAVENOUS
  Filled 2019-03-13: qty 1

## 2019-03-13 MED ORDER — HEPARIN 1000 UNIT/ML FOR PERITONEAL DIALYSIS
500.0000 [IU] | INTRAMUSCULAR | Status: DC | PRN
  Filled 2019-03-13: qty 0.5

## 2019-03-13 MED ORDER — GENTAMICIN SULFATE 0.1 % EX CREA: 1.0000 "application " | TOPICAL_CREAM | Freq: Every day | CUTANEOUS | Status: DC

## 2019-03-13 NOTE — TOC Initial Note (Signed)
Transition of Care [facility]) - Initial/Assessment Note    Patient Details  Name: Brenda Davis MRN: [id number] Date of Birth: 05/22/1965  Transition of Care [facility]) CM/SW Contact:    Maryclare Labrador, RN Phone Number: 03/13/2019, 4:23 PM  Clinical Narrative:  PTA independent from home with spouse.  Pt has PCP and denied barriers with paying for discharge medications.  Pt declining HH at this time due to pt attempting to restrict visitors in her home.  Pt informed that she can ask PCP for [facility] orders if she deems she needs it post dsicharge.              Expected Discharge Plan: Home/Self Care Barriers to Discharge: Barriers Resolved   Patient Goals and CMS Choice        Expected Discharge Plan and Services Expected Discharge Plan: Home/Self Care                                              Prior Living Arrangements/Services   Lives with:: Spouse Patient language and need for interpreter reviewed:: Yes Do you feel safe going back to the place where you live?: Yes      Need for Family Participation in Patient Care: No (Comment) Care giver support system in place?: Yes (comment)   Criminal Activity/Legal Involvement Pertinent to Current Situation/Hospitalization: No - Comment as needed  Activities of Daily Living Home Assistive Devices/Equipment: None ADL Screening (condition at time of admission) Patient's cognitive ability adequate to safely complete daily activities?: Yes Is the patient deaf or have difficulty hearing?: No Does the patient have difficulty seeing, even when wearing glasses/contacts?: No Does the patient have difficulty concentrating, remembering, or making decisions?: No Patient able to express need for assistance with ADLs?: Yes Does the patient have difficulty dressing or bathing?: Yes Independently performs ADLs?: Yes (appropriate for developmental age) Does the patient have difficulty walking or climbing stairs?: Yes Weakness of  Legs: None Weakness of Arms/Hands: None  Permission Sought/Granted   Permission granted to share information with : Yes, Verbal Permission Granted              Emotional Assessment   Attitude/Demeanor/Rapport: Gracious, Self-Confident, Engaged Affect (typically observed): Accepting, Adaptable Orientation: : Oriented to Self, Oriented to Place, Oriented to  Time, Oriented to Situation   Psych Involvement: No (comment)  Admission diagnosis:  NSTEMI (non-ST elevated myocardial infarction) ([place]) [I21.4] Chest pain [R07.9] Chest pain, unspecified type [R07.9] Unstable angina ([place]) [I20.0] Patient Active Problem List   Diagnosis Date Noted  . Unstable angina ([place]) 03/03/2019  . Chest pain 03/02/2019  . Retroperitoneal hemorrhage - post Cath/PCI - taken to OR for drainage & CFA repair 01/18/2019  . Acute blood loss anemia - resolved, transfused in OR - Hgb stable 01/18/2019  . Increased anion gap metabolic acidosis 47/42/5956  . Shock circulatory ([place]) - secondary to acute blood loss anemia, elevated WBC (? sepsis vs post-op inflammation).h 01/18/2019  . Postprocedural hypotension   . Hyperkalemia   . Dysuria 11/25/2018  . Coronary artery disease involving native coronary artery of native heart with unstable angina pectoris ([place])   . Hypocalcemia 06/18/2018  . Chest pain, rule out acute myocardial infarction 06/18/2018  . Localized swelling of finger of left hand 06/01/2018  . Sepsis ([place]) 05/23/2018  . C. difficile diarrhea 05/08/2018  . Diarrhea of infectious origin 05/04/2018  .  Hemodialysis-associated hypotension 04/28/2018  . Elevated troponin 03/08/2018  . Folate deficiency 03/08/2018  . NSTEMI (non-ST elevated myocardial infarction) ([place])   . CAD in native artery   . Hepatic cirrhosis ([place]) 11/24/2017  . ESRD on peritoneal dialysis ([place]) 08/25/2017  . DM (diabetes mellitus), type 2 with renal complications ([place]) 51/88/4166  . Chronic heart failure with preserved  ejection fraction ([place]) 06/22/2017  . Trigger finger, left middle finger 09/14/2016  . Sensorineural hearing loss (SNHL), bilateral 06/29/2016  . Allergic rhinitis 06/27/2016  . Severe persistent asthma with exacerbation 05/12/2016  . Acute respiratory failure with hypoxia and hypercapnia ([place]) 05/06/2016  . Anemia of chronic disease 05/06/2016  . Routine general medical examination at a health care facility 12/05/2015  . Cervical cancer screening 12/05/2015  . Visit for screening mammogram 01/23/2015  . Gout due to renal impairment 09/13/2014  . Thiamine deficiency 12/02/2012  . Hypokalemia 11/28/2012  . S/P laparoscopic sleeve gastrectomy 11/28/2012  . B12 deficiency anemia 11/28/2012  . Morbid obesity with BMI of 40.0-44.9, adult ([place]) 07/22/2012  . Dyslipidemia, goal LDL below 70 04/09/2008  . CKD (chronic kidney disease) stage V requiring chronic dialysis (MWF) 02/08/2008   PCP:  [name], MD Pharmacy:   [id number] [place] [place] [address]Phone: [phone]: [phone] [facility] [place] [address]Phone: [phone]: [phone] [facility] [place] [address]Phone: [phone]: [phone]    Social Determinants of Health (SDOH) Interventions    Readmission Risk Interventions Readmission Risk Prevention Plan 01/23/2019 01/20/2019  Transportation Screening - Complete  Medication Review (Press photographer - Complete  PCP or Specialist appointment within 3-5 days of discharge - Not Complete  PCP/Specialist Appt Not Complete comments - Not medically stable for discharge  [place]or HMeta- Complete  SW Recovery Care/Counseling Consult - Complete  [place]ians[place]Not Applicable Complete  Some recent data might be hidden

## 2019-03-13 NOTE — Progress Notes (Signed)
Inpatient Diabetes Program Recommendations  AACE/ADA: New Consensus Statement on Inpatient Glycemic Control (2015)  Target Ranges:  Prepandial:   less than 140 mg/dL      Peak postprandial:   less than 180 mg/dL (1-2 hours)      Critically ill patients:  140 - 180 mg/dL   Lab Results  Component Value Date   GLUCAP 182 (H) 03/13/2019   HGBA1C 7.2 (H) 01/17/2019    Review of Glycemic Control Results for Brenda Davis, Brenda Davis (MRN [id number]) as of 03/13/2019 12:46  Ref. Range 03/12/2019 11:46 03/12/2019 16:50 03/12/2019 21:15 03/13/2019 07:55 03/13/2019 11:35  Glucose-Capillary Latest Ref Range: 70 - 99 mg/dL 253 (H) 204 (H) 245 (H) 253 (H) 182 (H)   Diabetes history: DM 2 Outpatient Diabetes medications:  None noted Current orders for Inpatient glycemic control:  Novolog 0-6 units tid with meals Decadron 2 mg daily  Inpatient Diabetes Program Recommendations:    Consider use of COVID 19 glycemic control order set.  May consider addition of Levemir 6 units bid.    Thanks, Adah Perl, RN, BC-ADM Inpatient Diabetes Coordinator Pager [phone] (8a-5p)

## 2019-03-13 NOTE — Progress Notes (Signed)
[facility] Progress Note   Subjective:  CT abd didn't show anything new.  Pt w/o new c/o.   Objective Vitals:   03/13/19 0605 03/13/19 0611 03/13/19 0625 03/13/19 0908  BP:  (!) 147/69  130/68  Pulse: (!) 58 (!) 56  (!) 55  Resp:    20  Temp:      TempSrc:    Oral  SpO2: 98% 98%  100%  Weight:   126.9 kg   Height:       Physical Exam General: NAD, no distress, looks tired Heart: RRR; no murmur Lungs: CTAB; no c/w/r Abdomen: soft, non-tender Extremities: No LE edema Dialysis Access:  PD cath present    Dialysis Orders: CCPD 6 exchanges 2.7Lfill.1.5h dwell No day dwell.(Patient says she has been doing 5 exchanges)  Assessment/Plan: 1. CAD/Unstable angina-  Known multivessel disease. LHC 2/12 with progressive disease, occlusion of recent OM stent.  Medical therapy for now, started on isosorbidewhich has helped CP.  Suspect hypotension was likely brewing sepsis/ COVID in retrospect, tapering midodrine.   2. Sepsis/bacteremia: Febrile overnight 2/16. No signs of peritonitis, but studies ordered -> cell ct 19, Cx pending. CXR with new bibasilar opacities, ?pna. BCx grew staph capitis. CT abd/pelv per ID no new findings. Per ID will get 2 wks po doxy for bacteremia 3. COVID + - getting decadron and remdesivir too, D #5 4. ESRD: Continue CCPD- weights up, will cont mix of 1.5 and 2.5%.   5. Hypokalemia: Improving. 6. Hypertension/volume: See above. On isosorbide + mido now. 7. Anemia - Hgb 11.5. No ESA needs. Follow trends. 8. Metabolic bone disease: Caremains low - ^ Phoslo to 2/meals and sensipar held. Will start calcitriol 0.71mg PO q day. 9. Nutrition:Continuepro-stat supplements while here. 10. GERD: on PPI  RKelly Splinter MD 03/13/2019, 10:58 AM      Additional Objective Labs: Basic Metabolic Panel: Recent Labs  Lab 03/08/19 0126 03/08/19 0126 03/09/19 0412 03/10/19 0303 03/11/19 0500 03/12/19 0500 03/13/19 0237  NA 133*   < >  133*   < > 132* 133* 132*  K 4.5   < > 4.2   < > 3.9 3.6 3.5  CL 93*   < > 93*   < > 96* 93* 91*  CO2 20*   < > 20*   < > 19* 21* 21*  GLUCOSE 117*   < > 149*   < > 215* 281* 311*  BUN 46*   < > 47*   < > 53* 51* 49*  CREATININE 15.42*   < > 16.11*   < > 16.16* 14.78* 13.66*  CALCIUM 6.8*   < > 6.7*   < > 6.8* 7.3* 7.6*  PHOS 4.0  --  5.4*  --   --   --   --    < > = values in this interval not displayed.   Liver Function Tests: Recent Labs  Lab 03/11/19 0500 03/12/19 0500 03/13/19 0237  AST 18 19 19   ALT 13 14 13   ALKPHOS 83 83 76  BILITOT 0.5 0.8 0.4  PROT 5.8* 5.9* 5.8*  ALBUMIN 2.1* 2.2* 2.3*   CBC: Recent Labs  Lab 03/08/19 0126 03/08/19 0126 03/09/19 0412 03/09/19 0412 03/10/19 0303 03/10/19 0303 03/11/19 0500 03/12/19 0500 03/13/19 0237  WBC 10.5   < > 16.5*   < > 11.0*   < > 14.6* 12.6* 12.6*  NEUTROABS 7.4  --   --   --   --   --   --   --   --  HGB 11.6*   < > 11.5*   < > 11.0*   < > 11.6* 11.1* 11.0*  HCT 37.2   < > 37.2   < > 35.5*   < > 34.6* 33.8* 32.8*  MCV 100.3*   < > 102.2*  --  101.1*  --  95.6 96.3 94.3  PLT 386   < > 299   < > 273   < > 267 294 297   < > = values in this interval not displayed.   Blood Culture    Component Value Date/Time   SDES PERITONEAL DIALYSATE 03/08/2019 2058   SDES PERITONEAL DIALYSATE 03/08/2019 2058   [place] AND ANAEROBIC 03/08/2019 2058   [place] NONE 03/08/2019 2058   CULT  03/08/2019 2058    NO GROWTH 5 DAYS Performed at [facility] [address] [place], [place] [address]    REPTSTATUS 03/13/2019 FINAL 03/08/2019 2058   REPTSTATUS 03/08/2019 FINAL 03/08/2019 2058   Medications: . sodium chloride 10 mL/hr at 03/03/19 0800   . aspirin EC  81 mg Oral Daily  . calcitRIOL  0.25 mcg Oral Daily  . calcium acetate  1,334 mg Oral TID WC  . dexamethasone (DECADRON) injection  2 mg Intravenous q1800  . doxycycline  100 mg Oral Q12H  . feeding supplement (PRO-STAT SUGAR  FREE 64)  30 mL Oral BID  . ferric citrate  420 mg Oral TID WC  . folic acid  1 mg Oral Daily  . gabapentin  100 mg Oral QHS  . gentamicin cream  1 application Topical Daily  . heparin injection (subcutaneous)  5,000 Units Subcutaneous Q8H  . hydrocortisone   Topical QID  . insulin aspart  0-6 Units Subcutaneous TID WC  . isosorbide dinitrate  10 mg Oral BID  . midodrine  2.5 mg Oral BID WC  . pantoprazole  40 mg Oral BID AC  . rosuvastatin  10 mg Oral Daily  . ticagrelor  90 mg Oral BID  . vitamin B-12  1,000 mcg Oral Daily

## 2019-03-13 NOTE — Progress Notes (Signed)
Physical Therapy Treatment Patient Details Name: Brenda Davis MRN: [id number] DOB: [birth year] Today's Date: 03/13/2019    History of Present Illness Patient is a 61 old female with history of end-stage renal disease on peritoneal dialysis, coronary artery disease status post multiple PCIs, chronic diastolic heart failure, diabetes, hyperlipidemia who presents to the emergency department on February 11 with complaints of chest pain.  Work-up revealed elevated troponin concerning for NSTEMI.  Cardiology consulted.  She underwent cardiac cath with plans for medical management. 03/09/19 diagnosed COVID+    PT Comments    Pt's cognition is much improved and is likely close to baseline. When asked what the date was, pt responded with a straight face  "June, 3rd, 2023", then smiled and said "Gotcha" and provided the correct date. Pt is limited in safe mobility by decreased strength and endurance. Pt is mod I for bed mobility, and min guard for transfers and ambulation of 180 feet with RW. Pt has RW at home and agrees to use it until she is steadier. Pt educated in energy conservation and instructed in LE exercises to build strength. Given pt's weakness PT recommending HHPT at discharge. Pt reports her husband will be home to help her out as he has COVID but is asymptomatic. PT will continue to follow acutely until d/c.   Follow Up Recommendations  Home health PT;Supervision for mobility/OOB     Equipment Recommendations  None recommended by PT       Precautions / Restrictions Precautions Precautions: Other (comment);Fall Precaution Comments: watch BP Restrictions Weight Bearing Restrictions: No    Mobility  Bed Mobility Overal bed mobility: Modified Independent             General bed mobility comments: HoB elevated and use of bedrail to come to EoB   Transfers Overall transfer level: Needs assistance Equipment used: Rolling walker (2 wheeled);None Transfers: Sit  to/from Stand Sit to Stand: Min guard         General transfer comment: min guard for safety, vc for push off from the bed and then reaching up to RW  Ambulation/Gait Ambulation/Gait assistance: Min guard Gait Distance (Feet): 180 Feet Assistive device: Rolling walker (2 wheeled) Gait Pattern/deviations: Step-through pattern;Decreased stride length Gait velocity: slowed Gait velocity interpretation: 1.31 - 2.62 ft/sec, indicative of limited community ambulator General Gait Details: hands on min guard for safety, pt with mildly unsteady gait, no overt LoB, requires 1x standing rest break due to fatigue          Balance Overall balance assessment: Needs assistance   Sitting balance-Leahy Scale: Good     Standing balance support: No upper extremity supported Standing balance-Leahy Scale: Fair                              Cognition Arousal/Alertness: Awake/alert Behavior During Therapy: WFL for tasks assessed/performed Overall Cognitive Status: Within Functional Limits for tasks assessed                                 General Comments: back to baseline level of cognition      Exercises General Exercises - Lower Extremity Long Arc Quad: AROM;Both;10 reps;Seated Hip Flexion/Marching: AROM;Both;10 reps;Seated Toe Raises: AROM;Both;10 reps;Seated Heel Raises: AROM;Both;10 reps;Seated    General Comments General comments (skin integrity, edema, etc.): on RA pt maintained SaO2 90-95%O2  with max HR 95 bpm  Pertinent Vitals/Pain Pain Assessment: Faces Faces Pain Scale: Hurts a little bit Pain Location: generalized  Pain Descriptors / Indicators: Grimacing Pain Intervention(s): Limited activity within patient's tolerance;Monitored during session;Repositioned;Premedicated before session           PT Goals (current goals can now be found in the care plan section) Acute Rehab PT Goals Patient Stated Goal: to go home PT Goal Formulation:  With patient/family Time For Goal Achievement: 03/13/19 Potential to Achieve Goals: Good Progress towards PT goals: Progressing toward goals    Frequency    Min 3X/week      PT Plan Discharge plan needs to be updated       AM-PAC PT "6 Clicks" Mobility   Outcome Measure  Help needed turning from your back to your side while in a flat bed without using bedrails?: None Help needed moving from lying on your back to sitting on the side of a flat bed without using bedrails?: A Little Help needed moving to and from a bed to a chair (including a wheelchair)?: None Help needed standing up from a chair using your arms (e.g., wheelchair or bedside chair)?: None Help needed to walk in hospital room?: None Help needed climbing 3-5 steps with a railing? : A Lot 6 Click Score: 21    End of Session Equipment Utilized During Treatment: Gait belt Activity Tolerance: Patient tolerated treatment well Patient left: with call bell/phone within reach;in chair Nurse Communication: Mobility status PT Visit Diagnosis: Unsteadiness on feet (R26.81);Muscle weakness (generalized) (M62.81);Difficulty in walking, not elsewhere classified (R26.2)     Time: 2878-6767 PT Time Calculation (min) (ACUTE ONLY): 20 min  Charges:  $Gait Training: 8-22 mins                     Brenda Davis B. Brenda Davis PT, DPT Acute Rehabilitation Services Pager [phone] Office [phone]    [place] 03/13/2019, 12:55 PM

## 2019-03-13 NOTE — Progress Notes (Signed)
PROGRESS NOTE                                                                                                                                                                                                             Patient Demographics:    Brenda Davis, is a 54 y.o. female, DOB - 1965-06-21, MLY:[id number]  Outpatient Primary MD for the patient is [name], MD   Admit date - 03/02/2019   LOS - 10  Chief Complaint  Patient presents with  . Chest Pain       Brief Narrative: Patient is a 54 y.o. female with PMHx of ESRD on PD, CAD s/p PCI, chronic diastolic heart failure, DM, HLD who presented to the ED on 2/11 with chest pain-found to have non-STEMI-underwent LHC-Per cardiology due to tortuosity and location of the vessel-not amenable for PCI-and medical management recommended.  Hospital course complicated by hypotension, and fever-subsequently found to have COVID-19.  See below for further details.   Subjective:   Lying comfortably in bed-no major issues overnight.  Inquiring when she can go home.   Assessment  & Plan :   Non-STEMI-prior history of CAD s/p PCI 01/17/2019:  remains chest pain-free-per cardiology-due to location/tortuosity of culprit lesion-not a candidate for PCI-medical management recommended.  Continue aspirin/statin/Brilinta-tolerating nitrates with stable BP-midodrine tapered down-we will discontinue today.  Sepsis secondary to COVID-19 pneumonia and concurrent bacterial pneumonia: Sepsis physiology improving-has been transitioned to room air.  Peritoneal dialysate cultures negative so far, 1/2 blood cultures on 2/17+ for Staphylococcus capitis-this patient still has a small right groin wound (recent history of right groin exploration for hematoma following LHC in January 2021).  CT abdomen with decreasing size of the hematoma/seromas.  Has been on vancomycin/cefepime for the past  several days-Case was discussed with Dr. Cherlynn Polo MD-after chart review-recommendations were to transition to doxycycline to complete a 10-14-day course of antibiotics.  Patient will finish up steroids/remdesivir on 2/22.    Note-although patient was Covid positive on 2/18 (initial Covid test on 2/11 on admission was negative)-Per patient her husband (at home) also tested positive on 2/18-suspect patient acquired Covid in the outpatient setting  Fever: afebrile this morning  O2 requirements:  SpO2: 100 % O2 Flow Rate (L/min): 2 L/min   COVID-19 Labs: Recent Labs    03/11/19 0500 03/12/19 0500 03/13/19 0237  DDIMER 1.52* 1.48* 1.88*  FERRITIN 3,877* 3,606* 3,492*  CRP 17.2* 11.2* 7.8*       Component Value Date/Time   BNP 110.2 (H) 06/18/2018 2314    Recent Labs  Lab 03/09/19 0412 03/10/19 0303 03/11/19 0500 03/12/19 0500  PROCALCITON 16.09 21.54 20.91 16.21    Lab Results  Component Value Date   SARSCOV2NAA POSITIVE (A) 03/09/2019   SARSCOV2NAA NEGATIVE 03/02/2019   SARSCOV2NAA NEGATIVE 01/15/2019   [place] NEGATIVE 06/18/2018     COVID-19 Medications: Steroids: 2/18>>2/22 Remdesivir: 2/18>>2/22  Antibiotics: Doxy:2/22>> Vancomycin:2/17>>2/22 Cefepime: 2/17>>2/22  Prone/Incentive Spirometry: encouraged  incentive spirometry use 3-4/hour.  DVT Prophylaxis  :   Heparin   ESRD on peritoneal dialysis: Nephrology following and directing care  Hypotension: BP is stable-continue midodrine-but continue to taper.  Chronic diastolic heart failure: Stable volume status-diuresis with peritoneal dialysis.  DM-2 (A1c 7.21 December 2018): CBG stable-continue SSI.  Resume Metformin on discharge.  Recent Labs    03/12/19 1650 03/12/19 2115 03/13/19 0755  GLUCAP 204* 245* 253*   GERD: Continue PPI  Morbid Obesity: Estimated body mass index is 46.54 kg/m as calculated from the following:   Height as of this encounter: 5' 5"  (1.651 m).   Weight as of this  encounter: 126.9 kg.    Consults  : Nephrology/cardiology  Procedures  :   Left heart catheterization on 2/12  ABG:    Component Value Date/Time   PHART 7.307 (L) 01/18/2019 2358   PCO2ART 50.4 (H) 01/18/2019 2358   PO2ART 84.0 01/18/2019 2358   HCO3 25.3 01/18/2019 2358   TCO2 27 01/18/2019 2358   ACIDBASEDEF 1.0 01/18/2019 2358   O2SAT 95.0 01/18/2019 2358    Vent Settings: N/A  Condition -Guarded  Family Communication  :  Spouse updated over the phone 2/22  Code Status :  Full Code  Diet :  Diet Order            Diet heart healthy/carb modified Room service appropriate? Yes; Fluid consistency: Thin  Diet effective now               Disposition Plan  :  Remain hospitalized back to home with potentially home health services on 2/23.  Barriers to discharge: Complete 5 days of IV Remdesivir-taper of steroids and midodrine-to assess BP stability before discharge.  Antimicorbials  :    Anti-infectives (From admission, onward)   Start     Dose/Rate Route Frequency Ordered Stop   03/13/19 1015  doxycycline (VIBRA-TABS) tablet 100 mg     100 mg Oral Every 12 hours 03/13/19 1014 03/18/19 0959   03/12/19 1200  vancomycin (VANCOREADY) IVPB 1250 mg/250 mL  Status:  Discontinued     1,250 mg 166.7 mL/hr over 90 Minutes Intravenous 4D (96 hours ) 03/12/19 0730 03/12/19 0731   03/12/19 0745  vancomycin (VANCOREADY) IVPB 1250 mg/250 mL     1,250 mg 166.7 mL/hr over 90 Minutes Intravenous  Once 03/12/19 0732 03/12/19 1900   03/10/19 1000  remdesivir 100 mg in sodium chloride 0.9 % 100 mL IVPB     100 mg 200 mL/hr over 30 Minutes Intravenous Daily 03/09/19 1743 03/14/19 0959   03/09/19 1830  remdesivir 200 mg in sodium chloride 0.9% 250 mL IVPB     200 mg 580 mL/hr over 30 Minutes Intravenous Once 03/09/19 1743 03/09/19 2111   03/09/19 1800  ceFEPIme (MAXIPIME) 1 g in sodium chloride 0.9 % 100 mL IVPB  Status:  Discontinued     1 g  200 mL/hr over 30 Minutes Intravenous  Every 24 hours 03/08/19 1122 03/13/19 0933   03/08/19 1600  vancomycin (VANCOCIN) 2,500 mg in sodium chloride 0.9 % 500 mL IVPB     2,500 mg 250 mL/hr over 120 Minutes Intravenous  Once 03/08/19 1252 03/08/19 1712   03/08/19 1400  ceFEPIme (MAXIPIME) 2 g in sodium chloride 0.9 % 100 mL IVPB     2 g 200 mL/hr over 30 Minutes Intravenous  Once 03/08/19 1122 03/08/19 1654   03/08/19 1030  vancomycin (VANCOCIN) 2,500 mg in sodium chloride 0.9 % 500 mL IVPB  Status:  Discontinued     2,500 mg 250 mL/hr over 120 Minutes Intravenous  Once 03/08/19 0932 03/08/19 1510   03/08/19 0931  vancomycin variable dose per unstable renal function (pharmacist dosing)  Status:  Discontinued      Does not apply See admin instructions 03/08/19 0932 03/13/19 1015   03/08/19 0930  cefTRIAXone (ROCEPHIN) 1 g in sodium chloride 0.9 % 100 mL IVPB  Status:  Discontinued     1 g 200 mL/hr over 30 Minutes Intravenous  Once 03/08/19 0917 03/08/19 1126      Inpatient Medications  Scheduled Meds: . aspirin EC  81 mg Oral Daily  . calcitRIOL  0.25 mcg Oral Daily  . calcium acetate  1,334 mg Oral TID WC  . dexamethasone (DECADRON) injection  2 mg Intravenous q1800  . doxycycline  100 mg Oral Q12H  . feeding supplement (PRO-STAT SUGAR FREE 64)  30 mL Oral BID  . ferric citrate  420 mg Oral TID WC  . folic acid  1 mg Oral Daily  . gabapentin  100 mg Oral QHS  . gentamicin cream  1 application Topical Daily  . heparin injection (subcutaneous)  5,000 Units Subcutaneous Q8H  . hydrocortisone   Topical QID  . insulin aspart  0-6 Units Subcutaneous TID WC  . isosorbide dinitrate  10 mg Oral BID  . midodrine  2.5 mg Oral BID WC  . pantoprazole  40 mg Oral BID AC  . rosuvastatin  10 mg Oral Daily  . ticagrelor  90 mg Oral BID  . vitamin B-12  1,000 mcg Oral Daily   Continuous Infusions: . sodium chloride 10 mL/hr at 03/03/19 0800  . remdesivir 100 mg in NS 100 mL 100 mg (03/13/19 0956)   PRN Meds:.acetaminophen,  diclofenac Sodium, gentamicin cream, dianeal solution for CAPD/CCPD with heparin, dianeal solution for CAPD/CCPD with heparin, HYDROcodone-acetaminophen, morphine injection, nitroGLYCERIN, ondansetron (ZOFRAN) IV   Time Spent in minutes  25   See all Orders from today for further details   Oren Binet M.D on 03/13/2019 at 10:16 AM  To page go to [email or web address] - use universal password  [facility] -  Office  [phone]    Objective:   Vitals:   03/13/19 0605 03/13/19 0611 03/13/19 0625 03/13/19 0908  BP:  (!) 147/69  130/68  Pulse: (!) 58 (!) 56  (!) 55  Resp:    20  Temp:      TempSrc:    Oral  SpO2: 98% 98%  100%  Weight:   126.9 kg   Height:        Wt Readings from Last 3 Encounters:  03/13/19 126.9 kg  02/17/19 125.6 kg  02/11/19 127.9 kg     Intake/Output Summary (Last 24 hours) at 03/13/2019 1016 Last data filed at 03/13/2019 0622 Gross per 24 hour  Intake 580 ml  Output --  Net 580 ml  Physical Exam Gen Exam:Alert awake-not in any distress HEENT:atraumatic, normocephalic Chest: B/L clear to auscultation anteriorly CVS:S1S2 regular Abdomen:soft non tender, non distended Extremities:no edema Neurology: Non focal Skin: no rash   Data Review:    CBC Recent Labs  Lab 03/08/19 0126 03/08/19 0126 03/09/19 0412 03/10/19 0303 03/11/19 0500 03/12/19 0500 03/13/19 0237  WBC 10.5   < > 16.5* 11.0* 14.6* 12.6* 12.6*  HGB 11.6*   < > 11.5* 11.0* 11.6* 11.1* 11.0*  HCT 37.2   < > 37.2 35.5* 34.6* 33.8* 32.8*  PLT 386   < > 299 273 267 294 297  MCV 100.3*   < > 102.2* 101.1* 95.6 96.3 94.3  MCH 31.3   < > 31.6 31.3 32.0 31.6 31.6  MCHC 31.2   < > 30.9 31.0 33.5 32.8 33.5  RDW 16.9*   < > 17.0* 17.0* 15.9* 16.0* 16.0*  LYMPHSABS 1.1  --   --   --   --   --   --   MONOABS 1.6*  --   --   --   --   --   --   EOSABS 0.1  --   --   --   --   --   --   BASOSABS 0.0  --   --   --   --   --   --    < > = values in this interval not displayed.     Chemistries  Recent Labs  Lab 03/08/19 0126 03/08/19 0126 03/09/19 0412 03/10/19 0303 03/11/19 0500 03/12/19 0500 03/13/19 0237  NA 133*   < > 133* 133* 132* 133* 132*  K 4.5   < > 4.2 4.6 3.9 3.6 3.5  CL 93*   < > 93* 94* 96* 93* 91*  CO2 20*   < > 20* 20* 19* 21* 21*  GLUCOSE 117*   < > 149* 188* 215* 281* 311*  BUN 46*   < > 47* 50* 53* 51* 49*  CREATININE 15.42*   < > 16.11* 17.06* 16.16* 14.78* 13.66*  CALCIUM 6.8*   < > 6.7* 7.0* 6.8* 7.3* 7.6*  AST 33  --   --  23 18 19 19   ALT 19  --   --  15 13 14 13   ALKPHOS 108  --   --  97 83 83 76  BILITOT 0.6  --   --  0.6 0.5 0.8 0.4   < > = values in this interval not displayed.   ------------------------------------------------------------------------------------------------------------------ No results for input(s): CHOL, HDL, LDLCALC, TRIG, CHOLHDL, LDLDIRECT in the last 72 hours.  Lab Results  Component Value Date   HGBA1C 7.2 (H) 01/17/2019   ------------------------------------------------------------------------------------------------------------------ No results for input(s): TSH, T4TOTAL, T3FREE, THYROIDAB in the last 72 hours.  Invalid input(s): FREET3 ------------------------------------------------------------------------------------------------------------------ Recent Labs    03/12/19 0500 03/13/19 0237  FERRITIN 3,606* 3,492*    Coagulation profile No results for input(s): INR, PROTIME in the last 168 hours.  Recent Labs    03/12/19 0500 03/13/19 0237  DDIMER 1.48* 1.88*    Cardiac Enzymes No results for input(s): CKMB, TROPONINI, MYOGLOBIN in the last 168 hours.  Invalid input(s): CK ------------------------------------------------------------------------------------------------------------------    Component Value Date/Time   BNP 110.2 (H) 06/18/2018 2314    Micro Results Recent Results (from the past 240 hour(s))  Culture, blood (routine x 2)     Status: Abnormal   Collection  Time: 03/08/19  1:15 AM   Specimen: BLOOD  Result  Value Ref Range Status   Specimen Description BLOOD RIGHT HAND  Final   Special Requests   Final    BOTTLES DRAWN AEROBIC AND ANAEROBIC Blood Culture adequate volume   Culture  Setup Time   Final    GRAM POSITIVE COCCI IN CLUSTERS ANAEROBIC BOTTLE ONLY CRITICAL RESULT CALLED TO, READ BACK BY AND VERIFIED WITH: Alona Bene PHARMD, AT [place] 03/09/19 BY D.VANHOOK Performed at [facility], [place] [address], [place], [place] [address]    Culture STAPHYLOCOCCUS CAPITIS (A)  Final   Report Status 03/12/2019 FINAL  Final   Organism ID, Bacteria STAPHYLOCOCCUS CAPITIS  Final      Susceptibility   Staphylococcus capitis - MIC*    CIPROFLOXACIN <=0.5 SENSITIVE Sensitive     ERYTHROMYCIN >=8 RESISTANT Resistant     GENTAMICIN <=0.5 SENSITIVE Sensitive     OXACILLIN <=0.25 SENSITIVE Sensitive     TETRACYCLINE <=1 SENSITIVE Sensitive     VANCOMYCIN <=0.5 SENSITIVE Sensitive     TRIMETH/SULFA <=10 SENSITIVE Sensitive     CLINDAMYCIN INTERMEDIATE Intermediate     RIFAMPIN <=0.5 SENSITIVE Sensitive     Inducible Clindamycin NEGATIVE Sensitive     * STAPHYLOCOCCUS CAPITIS  Culture, blood (routine x 2)     Status: None   Collection Time: 03/08/19  1:24 AM   Specimen: BLOOD  Result Value Ref Range Status   Specimen Description BLOOD RIGHT ARM  Final   Special Requests   Final    BOTTLES DRAWN AEROBIC AND ANAEROBIC Blood Culture adequate volume   Culture   Final    NO GROWTH 5 DAYS Performed at [facility], [place] [address], [place], [place] [address]    Report Status 03/13/2019 FINAL  Final  Culture, body fluid-bottle     Status: None   Collection Time: 03/08/19  8:58 PM   Specimen: Peritoneal Dialysate  Result Value Ref Range Status   Specimen Description PERITONEAL DIALYSATE  Final   Special Requests BOTTLES DRAWN AEROBIC AND ANAEROBIC  Final   Culture   Final    NO GROWTH 5 DAYS Performed at [facility], [place] [address]  [place],  [address]    Report Status 03/13/2019 FINAL  Final  Gram stain     Status: None   Collection Time: 03/08/19  8:58 PM   Specimen: Peritoneal Dialysate  Result Value Ref Range Status   Specimen Description PERITONEAL DIALYSATE  Final   Special Requests NONE  Final   Gram Stain   Final    WBC PRESENT, PREDOMINANTLY MONONUCLEAR NO ORGANISMS SEEN CYTOSPIN SMEAR Performed at [facility], [place] [address] [place],  [address]    Report Status 03/08/2019 FINAL  Final  SARS CORONAVIRUS 2 (TAT 6-24 HRS) Nasopharyngeal Nasopharyngeal Swab     Status: Abnormal   Collection Time: 03/09/19 11:10 AM   Specimen: Nasopharyngeal Swab  Result Value Ref Range Status   SARS Coronavirus 2 POSITIVE (A) NEGATIVE Final    Comment: RESULT CALLED TO, READ BACK BY AND VERIFIED WITH: G COOPER,RN 1716 03/09/2019 D BRADLEY (NOTE) SARS-CoV-2 target nucleic acids are DETECTED. The SARS-CoV-2 RNA is generally detectable in upper and lower respiratory specimens during the acute phase of infection. Positive results are indicative of the presence of SARS-CoV-2 RNA. Clinical correlation with patient history and other diagnostic information is  necessary to determine patient infection status. Positive results do not rule out bacterial infection or co-infection with other viruses.  The expected result is Negative.  Fact Sheet for Patients: SugarRoll.be Fact Sheet for Healthcare Providers: https://www.woods-mathews.com/ This test is not yet approved or cleared by the [place] FDA and  has been authorized for detection and/or diagnosis of SARS-CoV-2 by FDA under an Emergency Use Authorization (EUA). This EUA will remain  in effect (meaning this test can be used) for th e duration of the COVID-19 declaration under Section 564(b)(1) of the Act, 21 U.S.C. section 360bbb-3(b)(1), unless the authorization is terminated or revoked sooner. Performed at  [facility], [place] [address], [place], [place] [address]   MRSA PCR Screening     Status: None   Collection Time: 03/10/19  7:32 AM   Specimen: Nasopharyngeal  Result Value Ref Range Status   MRSA by PCR NEGATIVE NEGATIVE Final    Comment:        The GeneXpert MRSA Assay (FDA approved for NASAL specimens only), is one component of a comprehensive MRSA colonization surveillance program. It is not intended to diagnose MRSA infection nor to guide or monitor treatment for MRSA infections. Performed at [facility], [place] [address], [place], [place] [address]     Radiology Reports CT ABDOMEN PELVIS WO CONTRAST  Result Date: 03/12/2019 CLINICAL DATA:  Cough, chest pain, sepsis EXAM: CT CHEST, ABDOMEN AND PELVIS WITHOUT CONTRAST TECHNIQUE: Multidetector CT imaging of the chest, abdomen and pelvis was performed following the standard protocol without IV contrast. COMPARISON:  Chest radiograph dated 03/09/2019 and CT abdomen pelvis dated 02/22/2019 FINDINGS: CT CHEST FINDINGS Cardiovascular: Vascular calcifications are seen in the coronary arteries and aortic arch. Normal heart size. No pericardial effusion. Mediastinum/Nodes: No enlarged mediastinal, hilar, or axillary lymph nodes. Thyroid gland, trachea, and esophagus demonstrate no significant findings. Lungs/Pleura: Mild scattered bilateral ground-glass opacities are seen in the bilateral lower lobes and in the left upper lobe. There is no pleural effusion or pneumothorax. Musculoskeletal: No chest wall mass or suspicious bone lesions identified. CT ABDOMEN PELVIS FINDINGS Hepatobiliary: The liver has a nodular surface contour, consistent with hepatic cirrhosis. No focal abnormality is seen. The patient is status post cholecystectomy. No biliary dilatation is seen. Pancreas: Unremarkable. No pancreatic ductal dilatation or surrounding inflammatory changes. Spleen: Normal in size without focal abnormality. Adrenals/Urinary Tract:  Adrenal glands are unremarkable. Kidneys are atrophic, without renal calculi, focal lesion, or hydronephrosis. Bladder is empty. A peritoneal dialysis catheter is seen in the lower central abdomen. 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 There is a small hiatal hernia. Enteric contrast reaches the distal small bowel. Appendix appears normal. No evidence of bowel wall thickening, distention, or inflammatory changes. Vascular/Lymphatic: Aortic atherosclerosis. No enlarged abdominal or pelvic lymph nodes. Reproductive: Uterus and bilateral adnexa are unremarkable. Other: Moderate volume free intraperitoneal fluid and a small locules of free intraperitoneal air are seen. These are likely related to peritoneal dialysis. A right retroperitoneal hematoma/seroma measures 7.9 x 6.7 x 4.9 cm, decreased since 02/22/2019 when it measured 9.8 x 8.6 x 6.1 cm. A hematoma/seroma in the right groin measures 4.2 cm in greatest dimension in the sagittal plane, decreased from 02/22/2019 when it measured 5.6 cm. Musculoskeletal: Mild degenerative changes are seen in the lumbar spine. IMPRESSION: 1. Mild scattered bilateral ground-glass opacities in the bilateral lower lobes and left upper lobe likely represent pneumonia. 2. Hepatic cirrhosis. 3. Moderate volume free intraperitoneal fluid and small locules of free intraperitoneal air, likely related to peritoneal dialysis and or hepatic cirrhosis. 4. Continued interval decreased size of fluid collections in the right retroperitoneum and right  groin which represent chronic hematoma/seroma. Aortic Atherosclerosis (ICD10-I70.0). Electronically Signed   By: Zerita Boers M.D.   On: 03/12/2019 12:01   CT ABDOMEN PELVIS WO CONTRAST  Result Date: 02/22/2019 CLINICAL DATA:  Persistent right-sided abdominal pain and palpable abnormality. Follow-up retroperitoneal hemorrhage. On peritoneal dialysis. EXAM: CT ABDOMEN AND PELVIS WITHOUT CONTRAST TECHNIQUE: Multidetector CT  imaging of the abdomen and pelvis was performed following the standard protocol without IV contrast. COMPARISON:  01/20/2019 FINDINGS: Lower chest: No acute findings. Hepatobiliary: Hepatic cirrhosis again demonstrated. No hepatic mass visualized on this unenhanced exam. Prior cholecystectomy. No evidence of biliary obstruction. Pancreas: No mass or inflammatory process visualized on this unenhanced exam. Spleen:  Within normal limits in size. Adrenals/Urinary tract: No evidence of urolithiasis or hydronephrosis. Unremarkable unopacified urinary bladder. A peritoneal dialysis catheter is again seen with tip in the pelvis a small amount of intraperitoneal air and fluid is again seen, which is attributable to the peritoneal dialysis. Stomach/Bowel: No evidence of obstruction, inflammatory process, or abnormal fluid collections. Diverticulosis is seen mainly involving the descending and sigmoid colon, however there is no evidence of diverticulitis. Vascular/Lymphatic: No pathologically enlarged lymph nodes identified. No evidence of abdominal aortic aneurysm. Aortic atherosclerosis incidentally noted. Reproductive:  No mass or other significant abnormality. Other: Interval decrease in right retroperitoneal hemorrhage is seen since previous study. A persistent retroperitoneal low-attenuation collection is seen in the right lower quadrant which measures 8.6 x 6.0 cm and likely represents a seroma. A smaller low-attenuation collection is now seen in the right groin which measures 4.2 x 2.4 cm and also likely represents a seroma. Musculoskeletal:  No suspicious bone lesions identified. IMPRESSION: 1. Interval decrease in right retroperitoneal hemorrhage since previous study. 2. Persistent low-attenuation collections in the right lower quadrant retroperitoneum and right groin, likely representing seromas. 3. Hepatic cirrhosis. 4. Mild ascites and free intraperitoneal air again seen, and attributable to peritoneal dialysis.  5. Colonic diverticulosis. No radiographic evidence of diverticulitis. Electronically Signed   By: Marlaine Hind M.D.   On: 02/22/2019 10:50   DG Chest 2 View  Result Date: 03/02/2019 CLINICAL DATA:  Chest pain EXAM: CHEST - 2 VIEW COMPARISON:  02/11/2019 FINDINGS: Heart and mediastinal contours are within normal limits. No focal opacities or effusions. No acute bony abnormality. IMPRESSION: No active cardiopulmonary disease. Electronically Signed   By: Rolm Baptise M.D.   On: 03/02/2019 17:40   DG Chest 2 View  Result Date: 02/11/2019 CLINICAL DATA:  Chest pain EXAM: CHEST - 2 VIEW COMPARISON:  01/17/2019 FINDINGS: The heart size is stable from prior study. There is no pneumothorax. No large pleural effusion. No focal infiltrate. There is no acute osseous abnormality. Aortic calcifications are noted. IMPRESSION: No active cardiopulmonary disease. Electronically Signed   By: Constance Holster M.D.   On: 02/11/2019 21:44   CT CHEST WO CONTRAST  Result Date: 03/12/2019 CLINICAL DATA:  Cough, chest pain, sepsis EXAM: CT CHEST, ABDOMEN AND PELVIS WITHOUT CONTRAST TECHNIQUE: Multidetector CT imaging of the chest, abdomen and pelvis was performed following the standard protocol without IV contrast. COMPARISON:  Chest radiograph dated 03/09/2019 and CT abdomen pelvis dated 02/22/2019 FINDINGS: CT CHEST FINDINGS Cardiovascular: Vascular calcifications are seen in the coronary arteries and aortic arch. Normal heart size. No pericardial effusion. Mediastinum/Nodes: No enlarged mediastinal, hilar, or axillary lymph nodes. Thyroid gland, trachea, and esophagus demonstrate no significant findings. Lungs/Pleura: Mild scattered bilateral ground-glass opacities are seen in the bilateral lower lobes and in the left upper lobe. There is no pleural  effusion or pneumothorax. Musculoskeletal: No chest wall mass or suspicious bone lesions identified. CT ABDOMEN PELVIS FINDINGS Hepatobiliary: The liver has a nodular surface  contour, consistent with hepatic cirrhosis. No focal abnormality is seen. The patient is status post cholecystectomy. No biliary dilatation is seen. Pancreas: Unremarkable. No pancreatic ductal dilatation or surrounding inflammatory changes. Spleen: Normal in size without focal abnormality. Adrenals/Urinary Tract: Adrenal glands are unremarkable. Kidneys are atrophic, without renal calculi, focal lesion, or hydronephrosis. Bladder is empty. A peritoneal dialysis catheter is seen in the lower central abdomen. 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 There is a small hiatal hernia. Enteric contrast reaches the distal small bowel. Appendix appears normal. No evidence of bowel wall thickening, distention, or inflammatory changes. Vascular/Lymphatic: Aortic atherosclerosis. No enlarged abdominal or pelvic lymph nodes. Reproductive: Uterus and bilateral adnexa are unremarkable. Other: Moderate volume free intraperitoneal fluid and a small locules of free intraperitoneal air are seen. These are likely related to peritoneal dialysis. A right retroperitoneal hematoma/seroma measures 7.9 x 6.7 x 4.9 cm, decreased since 02/22/2019 when it measured 9.8 x 8.6 x 6.1 cm. A hematoma/seroma in the right groin measures 4.2 cm in greatest dimension in the sagittal plane, decreased from 02/22/2019 when it measured 5.6 cm. Musculoskeletal: Mild degenerative changes are seen in the lumbar spine. IMPRESSION: 1. Mild scattered bilateral ground-glass opacities in the bilateral lower lobes and left upper lobe likely represent pneumonia. 2. Hepatic cirrhosis. 3. Moderate volume free intraperitoneal fluid and small locules of free intraperitoneal air, likely related to peritoneal dialysis and or hepatic cirrhosis. 4. Continued interval decreased size of fluid collections in the right retroperitoneum and right groin which represent chronic hematoma/seroma. Aortic Atherosclerosis (ICD10-I70.0). Electronically Signed   By:  Zerita Boers M.D.   On: 03/12/2019 12:01   CARDIAC CATHETERIZATION  Result Date: 03/03/2019  Mid LAD lesion is 75% stenosed.  Ost LAD to Mid LAD lesion is 40% stenosed.  Previously placed Ost 1st Mrg drug eluting stent is widely patent.  Mid Cx to Dist Cx lesion is 55% stenosed with 40% stenosed side branch in Ost 3rd Mrg.  Previously placed Prox RCA drug eluting stent is widely patent.  Ost 1st Mrg to 1st Mrg lesion is 99% stenosed. Partially in the stent and partially distal to the stent- subacte stent thrombosis.  LV end diastolic pressure is normal.  There is no aortic valve stenosis.  Medical therapy for stent thrombosis of the OM stent.  Given the severe tortuosity, I don't think repeat PCI attempt would be beneficial.  There was significant difficulty getting a stent delivered during the last cath.  I think there would be a high likelihood of completely occluding the vessel with wire manipulation. .   DG CHEST PORT 1 VIEW  Result Date: 03/09/2019 CLINICAL DATA:  Fever. EXAM: PORTABLE CHEST 1 VIEW COMPARISON:  03/08/2019. FINDINGS: Stable mild cardiomegaly. Mild bibasilar infiltrates with slightly improved aeration from prior exam. Tiny left pleural effusion cannot be excluded. No pneumothorax. IMPRESSION: 1.  Stable mild cardiomegaly. 2. Mild bibasilar infiltrates with slightly improved aeration from prior exam. Tiny left pleural effusion cannot be excluded. Electronically Signed   By: Marcello Moores  Register   On: 03/09/2019 10:41   DG Chest Port 1 View  Result Date: 03/08/2019 CLINICAL DATA:  Fever, cough. EXAM: PORTABLE CHEST 1 VIEW COMPARISON:  03/02/2019. FINDINGS: Trachea is midline. Heart size is accentuated by AP semi upright technique and low lung volumes. Thoracic aorta is calcified. Mild bibasilar airspace opacification, new from 03/02/2019.  No definite pleural fluid. IMPRESSION: 1. New mild bibasilar airspace opacification may be due in part to lower lung volumes than on 03/02/2019.  The possibility of pneumonia, including due to COVID-19, is also considered. 2.  Aortic atherosclerosis (ICD10-I70.0). Electronically Signed   By: Lorin Picket M.D.   On: 03/08/2019 09:23

## 2019-03-13 NOTE — Progress Notes (Signed)
Patient A&O x4. Status update given to patient. Questions answered. Patient verbalized understanding. Patient pleasant and cooperative with assessment and meds. Patient does not want HS snack. Patient repositioning self at this time. Patient resting in bed at this time, bed alarm on and call light within reach. Will continue to monitor.

## 2019-03-14 DIAGNOSIS — E1122 Type 2 diabetes mellitus with diabetic chronic kidney disease: Secondary | ICD-10-CM

## 2019-03-14 DIAGNOSIS — Z794 Long term (current) use of insulin: Secondary | ICD-10-CM

## 2019-03-14 LAB — CBC
HCT: 34 % — ABNORMAL LOW (ref 36.0–46.0)
Hemoglobin: 11.2 g/dL — ABNORMAL LOW (ref 12.0–15.0)
MCH: 31.6 pg (ref 26.0–34.0)
MCHC: 32.9 g/dL (ref 30.0–36.0)
MCV: 96 fL (ref 80.0–100.0)
Platelets: 321 10*3/uL (ref 150–400)
RBC: 3.54 MIL/uL — ABNORMAL LOW (ref 3.87–5.11)
RDW: 16.2 % — ABNORMAL HIGH (ref 11.5–15.5)
WBC: 12.4 10*3/uL — ABNORMAL HIGH (ref 4.0–10.5)
nRBC: 1.5 % — ABNORMAL HIGH (ref 0.0–0.2)

## 2019-03-14 LAB — COMPREHENSIVE METABOLIC PANEL
ALT: 14 U/L (ref 0–44)
AST: 16 U/L (ref 15–41)
Albumin: 2.3 g/dL — ABNORMAL LOW (ref 3.5–5.0)
Alkaline Phosphatase: 67 U/L (ref 38–126)
Anion gap: 18 — ABNORMAL HIGH (ref 5–15)
BUN: 49 mg/dL — ABNORMAL HIGH (ref 6–20)
CO2: 23 mmol/L (ref 22–32)
Calcium: 7.5 mg/dL — ABNORMAL LOW (ref 8.9–10.3)
Chloride: 91 mmol/L — ABNORMAL LOW (ref 98–111)
Creatinine, Ser: 13.36 mg/dL — ABNORMAL HIGH (ref 0.44–1.00)
GFR calc Af Amer: 3 mL/min — ABNORMAL LOW (ref >60)
GFR calc non Af Amer: 3 mL/min — ABNORMAL LOW (ref >60)
Glucose, Bld: 281 mg/dL — ABNORMAL HIGH (ref 70–99)
Potassium: 3.3 mmol/L — ABNORMAL LOW (ref 3.5–5.1)
Sodium: 132 mmol/L — ABNORMAL LOW (ref 135–145)
Total Bilirubin: 0.7 mg/dL (ref 0.3–1.2)
Total Protein: 5.4 g/dL — ABNORMAL LOW (ref 6.5–8.1)

## 2019-03-14 LAB — C-REACTIVE PROTEIN: CRP: 5.1 mg/dL — ABNORMAL HIGH (ref <1.0)

## 2019-03-14 LAB — GLUCOSE, CAPILLARY: Glucose-Capillary: 231 mg/dL — ABNORMAL HIGH (ref 70–99)

## 2019-03-14 LAB — D-DIMER, QUANTITATIVE: D-Dimer, Quant: 2.22 ug/mL-FEU — ABNORMAL HIGH (ref 0.00–0.50)

## 2019-03-14 LAB — FERRITIN: Ferritin: 3228 ng/mL — ABNORMAL HIGH (ref 11–307)

## 2019-03-14 MED ORDER — NOVOLOG FLEXPEN 100 UNIT/ML ~~LOC~~ SOPN: PEN_INJECTOR | SUBCUTANEOUS | 0 refills | Status: DC

## 2019-03-14 MED ORDER — INSULIN ASPART 100 UNIT/ML ~~LOC~~ SOLN: SUBCUTANEOUS | 0 refills | Status: DC

## 2019-03-14 MED ORDER — ISOSORBIDE DINITRATE 10 MG PO TABS: 10.0000 mg | ORAL_TABLET | Freq: Two times a day (BID) | ORAL | 0 refills | Status: DC

## 2019-03-14 MED ORDER — PRO-STAT SUGAR FREE PO LIQD: 30.0000 mL | Freq: Two times a day (BID) | ORAL | 0 refills | Status: AC

## 2019-03-14 MED ORDER — DOXYCYCLINE HYCLATE 100 MG PO TABS: 100.0000 mg | ORAL_TABLET | Freq: Two times a day (BID) | ORAL | 0 refills | Status: AC

## 2019-03-14 MED ORDER — INSULIN PEN NEEDLE 32G X 8 MM MISC: 0 refills | Status: DC

## 2019-03-14 MED ORDER — PANTOPRAZOLE SODIUM 40 MG PO TBEC: 40.0000 mg | DELAYED_RELEASE_TABLET | Freq: Every day | ORAL | 0 refills | Status: DC

## 2019-03-14 NOTE — Discharge Instructions (Signed)
Person Under Monitoring Name: Brenda Davis  Location: [place] [address]   Infection Prevention Recommendations for Individuals Confirmed to have, or Being Evaluated for, 2019 Novel Coronavirus (COVID-19) Infection Who Receive Care at Home  Individuals who are confirmed to have, or are being evaluated for, COVID-19 should follow the prevention steps below until a healthcare provider or local or state health department says they can return to normal activities.  Stay home except to get medical care You should restrict activities outside your home, except for getting medical care. Do not go to work, school, or public areas, and do not use public transportation or taxis.  Call ahead before visiting your doctor Before your medical appointment, call the healthcare provider and tell them that you have, or are being evaluated for, COVID-19 infection. This will help the healthcare provider's office take steps to keep other people from getting infected. Ask your healthcare provider to call the local or state health department.  Monitor your symptoms Seek prompt medical attention if your illness is worsening (e.g., difficulty breathing). Before going to your medical appointment, call the healthcare provider and tell them that you have, or are being evaluated for, COVID-19 infection. Ask your healthcare provider to call the local or state health department.  Wear a facemask You should wear a facemask that covers your nose and mouth when you are in the same room with other people and when you visit a healthcare provider. People who live with or visit you should also wear a facemask while they are in the same room with you.  Separate yourself from other people in your home As much as possible, you should stay in a different room from other people in your home. Also, you should use a separate bathroom, if available.  Avoid sharing household items You should  not share dishes, drinking glasses, cups, eating utensils, towels, bedding, or other items with other people in your home. After using these items, you should wash them thoroughly with soap and water.  Cover your coughs and sneezes Cover your mouth and nose with a tissue when you cough or sneeze, or you can cough or sneeze into your sleeve. Throw used tissues in a lined trash can, and immediately wash your hands with soap and water for at least 20 seconds or use an alcohol-based hand rub.  Wash your [facility] your hands often and thoroughly with soap and water for at least 20 seconds. You can use an alcohol-based hand sanitizer if soap and water are not available and if your hands are not visibly dirty. Avoid touching your eyes, nose, and mouth with unwashed hands.   Prevention Steps for Caregivers and Household Members of Individuals Confirmed to have, or Being Evaluated for, COVID-19 Infection Being Cared for in the Home  If you live with, or provide care at home for, a person confirmed to have, or being evaluated for, COVID-19 infection please follow these guidelines to prevent infection:  Follow healthcare provider's instructions Make sure that you understand and can help the patient follow any healthcare provider instructions for all care.  Provide for the patient's basic needs You should help the patient with basic needs in the home and provide support for getting groceries, prescriptions, and other personal needs.  Monitor the patient's symptoms If they are getting sicker, call his or her medical provider and tell them that the patient has, or is being evaluated for, COVID-19 infection. This will help the healthcare provider's office  take steps to keep other people from getting infected. Ask the healthcare provider to call the local or state health department.  Limit the number of people who have contact with the patient  If possible, have only one caregiver for the  patient.  Other household members should stay in another home or place of residence. If this is not possible, they should stay  in another room, or be separated from the patient as much as possible. Use a separate bathroom, if available.  Restrict visitors who do not have an essential need to be in the home.  Keep older adults, very young children, and other sick people away from the patient Keep older adults, very young children, and those who have compromised immune systems or chronic health conditions away from the patient. This includes people with chronic heart, lung, or kidney conditions, diabetes, and cancer.  Ensure good ventilation Make sure that shared spaces in the home have good air flow, such as from an air conditioner or an opened window, weather permitting.  Wash your hands often  Wash your hands often and thoroughly with soap and water for at least 20 seconds. You can use an alcohol based hand sanitizer if soap and water are not available and if your hands are not visibly dirty.  Avoid touching your eyes, nose, and mouth with unwashed hands.  Use disposable paper towels to dry your hands. If not available, use dedicated cloth towels and replace them when they become wet.  Wear a facemask and gloves  Wear a disposable facemask at all times in the room and gloves when you touch or have contact with the patient's blood, body fluids, and/or secretions or excretions, such as sweat, saliva, sputum, nasal mucus, vomit, urine, or feces.  Ensure the mask fits over your nose and mouth tightly, and do not touch it during use.  Throw out disposable facemasks and gloves after using them. Do not reuse.  Wash your hands immediately after removing your facemask and gloves.  If your personal clothing becomes contaminated, carefully remove clothing and launder. Wash your hands after handling contaminated clothing.  Place all used disposable facemasks, gloves, and other waste in a lined  container before disposing them with other household waste.  Remove gloves and wash your hands immediately after handling these items.  Do not share dishes, glasses, or other household items with the patient  Avoid sharing household items. You should not share dishes, drinking glasses, cups, eating utensils, towels, bedding, or other items with a patient who is confirmed to have, or being evaluated for, COVID-19 infection.  After the person uses these items, you should wash them thoroughly with soap and water.  Wash laundry thoroughly  Immediately remove and wash clothes or bedding that have blood, body fluids, and/or secretions or excretions, such as sweat, saliva, sputum, nasal mucus, vomit, urine, or feces, on them.  Wear gloves when handling laundry from the patient.  Read and follow directions on labels of laundry or clothing items and detergent. In general, wash and dry with the warmest temperatures recommended on the label.  Clean all areas the individual has used often  Clean all touchable surfaces, such as counters, tabletops, doorknobs, bathroom fixtures, toilets, phones, keyboards, tablets, and bedside tables, every day. Also, clean any surfaces that may have blood, body fluids, and/or secretions or excretions on them.  Wear gloves when cleaning surfaces the patient has come in contact with.  Use a diluted bleach solution (e.g., dilute bleach with 1 part  bleach and 10 parts water) or a household disinfectant with a label that says EPA-registered for coronaviruses. To make a bleach solution at home, add 1 tablespoon of bleach to 1 quart (4 cups) of water. For a larger supply, add  cup of bleach to 1 gallon (16 cups) of water.  Read labels of cleaning products and follow recommendations provided on product labels. Labels contain instructions for safe and effective use of the cleaning product including precautions you should take when applying the product, such as wearing gloves or  eye protection and making sure you have good ventilation during use of the product.  Remove gloves and wash hands immediately after cleaning.  Monitor yourself for signs and symptoms of illness Caregivers and household members are considered close contacts, should monitor their health, and will be asked to limit movement outside of the home to the extent possible. Follow the monitoring steps for close contacts listed on the symptom monitoring form.   ? If you have additional questions, contact your local health department or call the epidemiologist on call at [phone] (available 24/7). ? This guidance is subject to change. For the most up-to-date guidance from [facility], please refer to their website: [email or web address]

## 2019-03-14 NOTE — Progress Notes (Signed)
Physical Therapy Treatment Patient Details Name: Brenda Davis DOBIE MRN: [id number] DOB: [birth year] Today's Date: 03/14/2019    History of Present Illness Patient is a 51 old female with history of end-stage renal disease on peritoneal dialysis, coronary artery disease status post multiple PCIs, chronic diastolic heart failure, diabetes, hyperlipidemia who presents to the emergency department on February 11 with complaints of chest pain.  Work-up revealed elevated troponin concerning for NSTEMI.  Cardiology consulted.  She underwent cardiac cath with plans for medical management. 03/09/19 diagnosed COVID+    PT Comments    Pt is eager to get home to her husband and is hopeful she can discharge a soon as possible. Pt has refused HHPT to limited people in her house. Focus of session review of HEP and energy conservation techniques. Pt able to perform and teach back supine and seated HEP.  Finished energy conservation education when husband called so mobility training limited. PT continues to believe pt would benefit from HHPT but pt has tools for building strength and endurance.    Follow Up Recommendations  Home health PT;Supervision for mobility/OOB     Equipment Recommendations  None recommended by PT       Precautions / Restrictions Restrictions Weight Bearing Restrictions: No    Mobility  Bed Mobility Overal bed mobility: Modified Independent             General bed mobility comments: HoB elevated and use of bedrail to come to EoB   Transfers Overall transfer level: Needs assistance Equipment used: None Transfers: Sit to/from Stand Sit to Stand: Min guard         General transfer comment: min guard for safety         Balance Overall balance assessment: Needs assistance   Sitting balance-Leahy Scale: Good     Standing balance support: No upper extremity supported Standing balance-Leahy Scale: Fair                              Cognition  Arousal/Alertness: Awake/alert Behavior During Therapy: WFL for tasks assessed/performed Overall Cognitive Status: Within Functional Limits for tasks assessed                                        Exercises General Exercises - Lower Extremity Long Arc Quad: AROM;Both;10 reps;Seated Hip ABduction/ADduction: AROM;10 reps;Supine Straight Leg Raises: AROM;Both;10 reps;Supine Hip Flexion/Marching: AROM;Both;10 reps;Seated Toe Raises: AROM;Both;10 reps;Seated Heel Raises: AROM;Both;10 reps;Seated Other Exercises Other Exercises: educated on energy conservation     General Comments General comments (skin integrity, edema, etc.): VSS      Pertinent Vitals/Pain Pain Assessment: No/denies pain           PT Goals (current goals can now be found in the care plan section) Acute Rehab PT Goals Patient Stated Goal: to go home PT Goal Formulation: With patient/family Time For Goal Achievement: 03/13/19 Potential to Achieve Goals: Good Progress towards PT goals: Progressing toward goals    Frequency    Min 3X/week      PT Plan Current plan remains appropriate       AM-PAC PT "6 Clicks" Mobility   Outcome Measure  Help needed turning from your back to your side while in a flat bed without using bedrails?: None Help needed moving from lying on your back to sitting on the side of a flat bed  without using bedrails?: A Little Help needed moving to and from a bed to a chair (including a wheelchair)?: None Help needed standing up from a chair using your arms (e.g., wheelchair or bedside chair)?: None Help needed to walk in hospital room?: None Help needed climbing 3-5 steps with a railing? : A Lot 6 Click Score: 21    End of Session   Activity Tolerance: Patient tolerated treatment well Patient left: with call bell/phone within reach;in bed(sitting EoB) Nurse Communication: Mobility status PT Visit Diagnosis: Unsteadiness on feet (R26.81);Muscle weakness  (generalized) (M62.81);Difficulty in walking, not elsewhere classified (R26.2)     Time: 1991-4445 PT Time Calculation (min) (ACUTE ONLY): 14 min  Charges:  $Therapeutic Exercise: 8-22 mins                     Callia Swim B. Migdalia Dk PT, DPT Acute Rehabilitation Services Pager [phone] Office [phone]    [place] 03/14/2019, 9:38 AM

## 2019-03-14 NOTE — TOC Benefit Eligibility Note (Signed)
Transition of Care [facility]) Benefit Eligibility Note    Patient Details  Name: Brenda Davis MRN: [id number] Date of Birth: 1965/04/13   Medication/Dose: Novolog  Covered?: Yes  Prescription Coverage [facility]erred Pharmacy: [facility]  Spoke with Person/Company/Phone Number:: [facility] [employer]  Co-Pay: vial:$35 for 30 day retail/ pens: $29.24 for 30 day retail  Prior Approval: No    [place] Phone Number: 03/14/2019, 11:26 AM

## 2019-03-14 NOTE — TOC Transition Note (Signed)
Transition of Care [facility]) - CM/SW Discharge Note   Patient Details  Name: Brenda Davis MRN: [id number] Date of Birth: 1965-03-07  Transition of Care [facility]) CM/SW Contact:  Maryclare Labrador, RN Phone Number: 03/14/2019, 11:40 AM   Clinical Narrative:   Pt deemed stable for discharge home today.  CM spoke with pt prior to discharge - pt still declining HH as recommended.  CM informed pt of copay of insulin at discharge - pt denied barriers with copay.  No other CM needs determined - CM signing off    Final next level of care: Home/Self Care Barriers to Discharge: No Barriers Identified   Patient Goals and CMS Choice        Discharge Placement                       Discharge Plan and Services                          HH Arranged: Refused HH          Social Determinants of Health (SDOH) Interventions     Readmission Risk Interventions Readmission Risk Prevention Plan 01/23/2019 01/20/2019  Transportation Screening - Complete  Medication Review Press photographer) - Complete  PCP or Specialist appointment within 3-5 days of discharge - Not Complete  PCP/Specialist Appt Not Complete comments - Not medically stable for discharge  [place] or [place] - Complete  SW Recovery Care/Counseling Consult - Complete  [place] Not Applicable Complete  Some recent data might be hidden

## 2019-03-14 NOTE — Progress Notes (Signed)
Brenda Davis to be D/C'd Home per MD order.  Discussed with the patient and all questions fully answered.  VSS, Skin clean, dry and intact without evidence of skin break down, no evidence of skin tears noted. IV catheter discontinued intact. Site without signs and symptoms of complications. Dressing and pressure applied.  An After Visit Summary was printed and given to the patient. Patient received prescription.  D/c education completed with patient including follow up instructions, medication list, d/c activities limitations if indicated, with other d/c instructions as indicated by MD - patient able to verbalize understanding, all questions fully answered.   Patient instructed to return to ED, call 911, or call MD for any changes in condition.   Patient escorted via [place], and D/C home via private auto.  Brenda Davis 03/14/2019 12:21 PM

## 2019-03-14 NOTE — Care Management Important Message (Signed)
Important Message  Patient Details  Name: Brenda Davis MRN: [id number] Date of Birth: 1965-02-13   Medicare Important Message Given:  Yes - Important Message mailed due to current National Emergency  Verbal consent obtained due to current National Emergency  Relationship to patient: Self Contact Name: LYNCOLN MASKELL Call Date: 03/14/19  Time: 1110 Phone: [phone] Outcome: Spoke with contact Important Message mailed to: Patient address on file    Delorse Lek 03/14/2019, 11:10 AM

## 2019-03-14 NOTE — Discharge Summary (Addendum)
PATIENT DETAILS Name: Brenda Davis Age: 54 y.o. Sex: female Date of Birth: 1965/07/10 MRN: [id number]. Admitting Physician: Geradine Girt, DO TDS:KAJGO, Arvid Right, MD  Admit Date: 03/02/2019 Discharge date: 03/14/2019  Recommendations for Outpatient Follow-up:  1. Follow up with PCP in 1-2 weeks 2. Please obtain CMP/CBC in one week 3. Repeat Chest Xray in 4-6 week 4. Please ensure follow up with cardiology  Admitted From:  Home  Disposition: Home with home health services   Home Health:  Yes  Equipment/Devices: None  Discharge Condition: Stable  CODE STATUS: FULL CODE  Diet recommendation:  Diet Order            Diet - low sodium heart healthy        Diet Carb Modified        Diet heart healthy/carb modified Room service appropriate? Yes; Fluid consistency: Thin  Diet effective now               Brief Summary: See H&P, Labs, Consult and Test reports for all details in brief, Patient is a 55 y.o. female with PMHx of ESRD on PD, CAD s/p PCI, chronic diastolic heart failure, DM, HLD who presented to the ED on 2/11 with chest pain-found to have non-STEMI-underwent LHC-Per cardiology due to tortuosity and location of the vessel-not amenable for PCI-and medical management recommended.  Hospital course complicated by hypotension, and fever-subsequently found to have COVID-19.  See below for further details.  Brief Hospital Course: Non-STEMI-prior history of CAD s/p PCI 01/17/2019:  remains chest pain-free-per cardiology-due to location/tortuosity of culprit lesion-not a candidate for PCI-medical management recommended.  Continue aspirin/statin/Brilinta-tolerating nitrates with stable BP-midodrine discontinued on 2/22.  Please ensure outpatient follow-up with cardiology.    Sepsis secondary to COVID-19 pneumonia and concurrent bacterial pneumonia: Sepsis physiology improving-has been transitioned to room air.  Peritoneal dialysate cultures negative so  far, 1/2 blood cultures on 2/17+ for Staphylococcus capitis-this patient still has a small right groin wound (recent history of right groin exploration for hematoma following LHC in January 2021).  CT abdomen with decreasing size of the hematoma/seromas.  Has been on vancomycin/cefepime for the past several days-Case was discussed with Dr. Cherlynn Polo MD-after chart review-recommendations were to transition to doxycycline to complete a 10-14-day course of antibiotics.  Patient will finish up steroids/remdesivir on 2/22.    Note-although patient was Covid positive on 2/18 (initial Covid test on 2/11 on admission was negative)-Per patient her husband (at home) also tested positive on 2/18-suspect patient acquired Covid in the outpatient setting  COVID-19 Labs:  Recent Labs    03/12/19 0500 03/13/19 0237 03/14/19 0544  DDIMER 1.48* 1.88* 2.22*  FERRITIN 3,606* 3,492* 3,228*  CRP 11.2* 7.8* 5.1*    Lab Results  Component Value Date   SARSCOV2NAA POSITIVE (A) 03/09/2019   SARSCOV2NAA NEGATIVE 03/02/2019   [place] NEGATIVE 01/15/2019   [place] NEGATIVE 06/18/2018     COVID-19 Medications: Steroids: 2/18>>2/22 Remdesivir: 2/18>>2/22  Antibiotics: Doxy:2/22>> Vancomycin:2/17>>2/22 Cefepime: 2/17>>2/22  ESRD on peritoneal dialysis: Nephrology followed and directing care  Hypotension: BP is stable-was started on midodrine-but has been tapered off.  Was also briefly on steroids for COVID-19-that also has been tapered off.  Chronic diastolic heart failure: Stable volume status-diuresis with peritoneal dialysis.  DM-2 (A1c 7.21 December 2018): CBG stable-patient does not take Metformin anymore.  She claims that she does not take any medications in the outpatient setting-and just watches her sugars at home.  I am discharging her on a sliding scale insulin  regimen-she has been on insulin in the past.  Obesity: Estimated body mass index is 47.8 kg/m as calculated from the  following:   Height as of this encounter: 5' 5"  (1.651 m).   Weight as of this encounter: 130.3 kg.    Procedures/Studies: None  Discharge Diagnoses:  Principal Problem:   Coronary artery disease involving native coronary artery of native heart with unstable angina pectoris ([place]) Active Problems:   Morbid obesity with BMI of 40.0-44.9, adult (HCC)   Anemia of chronic disease   Chronic heart failure with preserved ejection fraction (HCC)   ESRD on peritoneal dialysis (HCC)   DM (diabetes mellitus), type 2 with renal complications (HCC)   NSTEMI (non-ST elevated myocardial infarction) ([place])   Elevated troponin   Chest pain   Unstable angina [facility])   Discharge Instructions:    Person Under Monitoring Name: Brenda Davis  Location: [place] [address]   Infection Prevention Recommendations for Individuals Confirmed to have, or Being Evaluated for, 2019 Novel Coronavirus (COVID-19) Infection Who Receive Care at Home  Individuals who are confirmed to have, or are being evaluated for, COVID-19 should follow the prevention steps below until a healthcare provider or local or state health department says they can return to normal activities.  Stay home except to get medical care You should restrict activities outside your home, except for getting medical care. Do not go to work, school, or public areas, and do not use public transportation or taxis.  Call ahead before visiting your doctor Before your medical appointment, call the healthcare provider and tell them that you have, or are being evaluated for, COVID-19 infection. This will help the healthcare provider's office take steps to keep other people from getting infected. Ask your healthcare provider to call the local or state health department.  Monitor your symptoms Seek prompt medical attention if your illness is worsening (e.g., difficulty breathing). Before going to your medical appointment,  call the healthcare provider and tell them that you have, or are being evaluated for, COVID-19 infection. Ask your healthcare provider to call the local or state health department.  Wear a facemask You should wear a facemask that covers your nose and mouth when you are in the same room with other people and when you visit a healthcare provider. People who live with or visit you should also wear a facemask while they are in the same room with you.  Separate yourself from other people in your home As much as possible, you should stay in a different room from other people in your home. Also, you should use a separate bathroom, if available.  Avoid sharing household items You should not share dishes, drinking glasses, cups, eating utensils, towels, bedding, or other items with other people in your home. After using these items, you should wash them thoroughly with soap and water.  Cover your coughs and sneezes Cover your mouth and nose with a tissue when you cough or sneeze, or you can cough or sneeze into your sleeve. Throw used tissues in a lined trash can, and immediately wash your hands with soap and water for at least 20 seconds or use an alcohol-based hand rub.  Wash your [facility] your hands often and thoroughly with soap and water for at least 20 seconds. You can use an alcohol-based hand sanitizer if soap and water are not available and if your hands are not visibly dirty. Avoid touching your eyes, nose, and mouth with unwashed hands.  Prevention Steps for Caregivers and Household Members of Individuals Confirmed to have, or Being Evaluated for, COVID-19 Infection Being Cared for in the Home  If you live with, or provide care at home for, a person confirmed to have, or being evaluated for, COVID-19 infection please follow these guidelines to prevent infection:  Follow healthcare provider's instructions Make sure that you understand and can help the patient follow any  healthcare provider instructions for all care.  Provide for the patient's basic needs You should help the patient with basic needs in the home and provide support for getting groceries, prescriptions, and other personal needs.  Monitor the patient's symptoms If they are getting sicker, call his or her medical provider and tell them that the patient has, or is being evaluated for, COVID-19 infection. This will help the healthcare provider's office take steps to keep other people from getting infected. Ask the healthcare provider to call the local or state health department.  Limit the number of people who have contact with the patient  If possible, have only one caregiver for the patient.  Other household members should stay in another home or place of residence. If this is not possible, they should stay  in another room, or be separated from the patient as much as possible. Use a separate bathroom, if available.  Restrict visitors who do not have an essential need to be in the home.  Keep older adults, very young children, and other sick people away from the patient Keep older adults, very young children, and those who have compromised immune systems or chronic health conditions away from the patient. This includes people with chronic heart, lung, or kidney conditions, diabetes, and cancer.  Ensure good ventilation Make sure that shared spaces in the home have good air flow, such as from an air conditioner or an opened window, weather permitting.  Wash your hands often  Wash your hands often and thoroughly with soap and water for at least 20 seconds. You can use an alcohol based hand sanitizer if soap and water are not available and if your hands are not visibly dirty.  Avoid touching your eyes, nose, and mouth with unwashed hands.  Use disposable paper towels to dry your hands. If not available, use dedicated cloth towels and replace them when they become wet.  Wear a facemask  and gloves  Wear a disposable facemask at all times in the room and gloves when you touch or have contact with the patient's blood, body fluids, and/or secretions or excretions, such as sweat, saliva, sputum, nasal mucus, vomit, urine, or feces.  Ensure the mask fits over your nose and mouth tightly, and do not touch it during use.  Throw out disposable facemasks and gloves after using them. Do not reuse.  Wash your hands immediately after removing your facemask and gloves.  If your personal clothing becomes contaminated, carefully remove clothing and launder. Wash your hands after handling contaminated clothing.  Place all used disposable facemasks, gloves, and other waste in a lined container before disposing them with other household waste.  Remove gloves and wash your hands immediately after handling these items.  Do not share dishes, glasses, or other household items with the patient  Avoid sharing household items. You should not share dishes, drinking glasses, cups, eating utensils, towels, bedding, or other items with a patient who is confirmed to have, or being evaluated for, COVID-19 infection.  After the person uses these items, you should wash them thoroughly with soap and  water.  Wash laundry thoroughly  Immediately remove and wash clothes or bedding that have blood, body fluids, and/or secretions or excretions, such as sweat, saliva, sputum, nasal mucus, vomit, urine, or feces, on them.  Wear gloves when handling laundry from the patient.  Read and follow directions on labels of laundry or clothing items and detergent. In general, wash and dry with the warmest temperatures recommended on the label.  Clean all areas the individual has used often  Clean all touchable surfaces, such as counters, tabletops, doorknobs, bathroom fixtures, toilets, phones, keyboards, tablets, and bedside tables, every day. Also, clean any surfaces that may have blood, body fluids, and/or  secretions or excretions on them.  Wear gloves when cleaning surfaces the patient has come in contact with.  Use a diluted bleach solution (e.g., dilute bleach with 1 part bleach and 10 parts water) or a household disinfectant with a label that says EPA-registered for coronaviruses. To make a bleach solution at home, add 1 tablespoon of bleach to 1 quart (4 cups) of water. For a larger supply, add  cup of bleach to 1 gallon (16 cups) of water.  Read labels of cleaning products and follow recommendations provided on product labels. Labels contain instructions for safe and effective use of the cleaning product including precautions you should take when applying the product, such as wearing gloves or eye protection and making sure you have good ventilation during use of the product.  Remove gloves and wash hands immediately after cleaning.  Monitor yourself for signs and symptoms of illness Caregivers and household members are considered close contacts, should monitor their health, and will be asked to limit movement outside of the home to the extent possible. Follow the monitoring steps for close contacts listed on the symptom monitoring form.   ? If you have additional questions, contact your local health department or call the epidemiologist on call at [phone] (available 24/7). ? This guidance is subject to change. For the most up-to-date guidance from CDC, please refer to their website: YouBlogs.pl    Activity:  As tolerated   Discharge Instructions    Call MD for:  difficulty breathing, headache or visual disturbances   Complete by: As directed    Call MD for:  extreme fatigue   Complete by: As directed    Call MD for:  persistant nausea and vomiting   Complete by: As directed    Diet - low sodium heart healthy   Complete by: As directed    Diet Carb Modified   Complete by: As directed    Discharge instructions    Complete by: As directed    1.)  3 weeks of isolation from 03/09/2019   Follow with Primary MD  [name], MD in 1-2 weeks  Please get a complete blood count and chemistry panel checked by your Primary MD at your next visit, and again as instructed by your Primary MD.  Get Medicines reviewed and adjusted: Please take all your medications with you for your next visit with your Primary MD  Laboratory/radiological data: Please request your Primary MD to go over all hospital tests and procedure/radiological results at the follow up, please ask your Primary MD to get all Hospital records sent to his/her office.  In some cases, they will be blood work, cultures and biopsy results pending at the time of your discharge. Please request that your primary care M.D. follows up on these results.  Also Note the following: If you experience worsening of  your admission symptoms, develop shortness of breath, life threatening emergency, suicidal or homicidal thoughts you must seek medical attention immediately by calling 911 or calling your MD immediately  if symptoms less severe.  You must read complete instructions/literature along with all the possible adverse reactions/side effects for all the Medicines you take and that have been prescribed to you. Take any new Medicines after you have completely understood and accpet all the possible adverse reactions/side effects.   Do not drive when taking Pain medications or sleeping medications (Benzodaizepines)  Do not take more than prescribed Pain, Sleep and Anxiety Medications. It is not advisable to combine anxiety,sleep and pain medications without talking with your primary care practitioner  Special Instructions: If you have smoked or chewed Tobacco  in the last 2 yrs please stop smoking, stop any regular Alcohol  and or any Recreational drug use.  Wear Seat belts while driving.  Please note: You were cared for by a hospitalist during your hospital  stay. Once you are discharged, your primary care physician will handle any further medical issues. Please note that NO REFILLS for any discharge medications will be authorized once you are discharged, as it is imperative that you return to your primary care physician (or establish a relationship with a primary care physician if you do not have one) for your post hospital discharge needs so that they can reassess your need for medications and monitor your lab values.   Increase activity slowly   Complete by: As directed      Allergies as of 03/14/2019      Reactions   Amlodipine Swelling   Atorvastatin    Elevated LFT's   Clonidine Derivatives Swelling   Limbs swell   Doxycycline Nausea And Vomiting   "I threw up for 3 hours"   Welchol [colesevelam Hcl] Nausea Only      Medication List    STOP taking these medications   metFORMIN 500 MG tablet Commonly known as: Glucophage     TAKE these medications   aspirin 81 MG EC tablet Take 1 tablet (81 mg total) by mouth daily.   B-12 PO Take 1 tablet by mouth daily.   calcium acetate 667 MG capsule Commonly known as: PHOSLO Take 667 mg by mouth 3 (three) times daily with meals.   cinacalcet 90 MG tablet Commonly known as: SENSIPAR Take 90 mg by mouth at bedtime.   doxycycline 100 MG tablet Commonly known as: VIBRA-TABS Take 1 tablet (100 mg total) by mouth every 12 (twelve) hours for 4 days.   feeding supplement (PRO-STAT SUGAR FREE 64) Liqd Take 30 mLs by mouth 2 (two) times daily.   ferric citrate 1 GM 210 MG(Fe) tablet Commonly known as: Auryxia Take 2 tablets (420 mg total) by mouth 3 (three) times daily with meals.   folic acid 1 MG tablet Commonly known as: FOLVITE Take 1 tablet (1 mg total) by mouth daily.   gabapentin 100 MG capsule Commonly known as: NEURONTIN Take 100 mg by mouth 2 (two) times daily.   gentamicin cream 0.1 % Commonly known as: GARAMYCIN Apply 1 application topically See admin instructions.  Apply to exit site daily as directed.   Insulin Pen Needle 32G X 8 MM Misc Use as directed   isosorbide dinitrate 10 MG tablet Commonly known as: ISORDIL Take 1 tablet (10 mg total) by mouth 2 (two) times daily.   nitroGLYCERIN 0.4 MG SL tablet Commonly known as: NITROSTAT PLACE 1 TABLET (0.4 MG TOTAL) UNDER THE  TONGUE EVERY 5 (FIVE) MINUTES AS NEEDED FOR CHEST PAIN.   NovoLOG FlexPen 100 UNIT/ML FlexPen Generic drug: insulin aspart 0-6 Units, Subcutaneous, 3 times daily with meals, First dose on Fri 03/03/19 at 0800 Correction coverage: Very Sensitive (ESRD/Dialysis) CBG < 70: Implement Hypoglycemia measures/Call MD CBG 70 - 120: 0 units CBG 121 - 150: 0 units CBG 151 - 200: 1 unit CBG 201-250: 2 units CBG 251-300: 3 units CBG 301-350: 4 units CBG 351-400: 5 units CBG > 400: Give 6 units and call MD   ondansetron 8 MG disintegrating tablet Commonly known as: Zofran ODT Take 1 tablet (8 mg total) by mouth every 8 (eight) hours as needed for nausea or vomiting.   pantoprazole 40 MG tablet Commonly known as: PROTONIX Take 1 tablet (40 mg total) by mouth daily.   rosuvastatin 10 MG tablet Commonly known as: CRESTOR Take 1 tablet (10 mg total) by mouth daily.   ticagrelor 90 MG Tabs tablet Commonly known as: BRILINTA Take 1 tablet (90 mg total) by mouth 2 (two) times daily.      Follow-up Information    Almyra Deforest, [place] Follow up on 04/10/2019.   Specialties: Cardiology, Radiology Why: Please arrive 15 minutes early for your 11:15am post-hosptial cardiology follow-up appointment Contact information: [address] [place] [address] [phone]        [name], MD. Schedule an appointment as soon as possible for a visit in 1 week(s).   Specialty: Internal Medicine Contact information: [place] [address] [phone]        Skeet Latch, MD. Schedule an appointment as soon as possible for a visit in 2 week(s).     Specialty: Cardiology Contact information: [address] [place] [address] [phone]          Allergies  Allergen Reactions  . Amlodipine Swelling  . Atorvastatin     Elevated LFT's  . Clonidine Derivatives Swelling    Limbs swell  . Doxycycline Nausea And Vomiting    "I threw up for 3 hours"  . Welchol [Colesevelam Hcl] Nausea Only    Consultations:   nephrology   Other Procedures/Studies: CT ABDOMEN PELVIS WO CONTRAST  Result Date: 03/12/2019 CLINICAL DATA:  Cough, chest pain, sepsis EXAM: CT CHEST, ABDOMEN AND PELVIS WITHOUT CONTRAST TECHNIQUE: Multidetector CT imaging of the chest, abdomen and pelvis was performed following the standard protocol without IV contrast. COMPARISON:  Chest radiograph dated 03/09/2019 and CT abdomen pelvis dated 02/22/2019 FINDINGS: CT CHEST FINDINGS Cardiovascular: Vascular calcifications are seen in the coronary arteries and aortic arch. Normal heart size. No pericardial effusion. Mediastinum/Nodes: No enlarged mediastinal, hilar, or axillary lymph nodes. Thyroid gland, trachea, and esophagus demonstrate no significant findings. Lungs/Pleura: Mild scattered bilateral ground-glass opacities are seen in the bilateral lower lobes and in the left upper lobe. There is no pleural effusion or pneumothorax. Musculoskeletal: No chest wall mass or suspicious bone lesions identified. CT ABDOMEN PELVIS FINDINGS Hepatobiliary: The liver has a nodular surface contour, consistent with hepatic cirrhosis. No focal abnormality is seen. The patient is status post cholecystectomy. No biliary dilatation is seen. Pancreas: Unremarkable. No pancreatic ductal dilatation or surrounding inflammatory changes. Spleen: Normal in size without focal abnormality. Adrenals/Urinary Tract: Adrenal glands are unremarkable. Kidneys are atrophic, without renal calculi, focal lesion, or hydronephrosis. Bladder is empty. A peritoneal dialysis catheter is seen in the  lower central abdomen. 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 There is a small hiatal  hernia. Enteric contrast reaches the distal small bowel. Appendix appears normal. No evidence of bowel wall thickening, distention, or inflammatory changes. Vascular/Lymphatic: Aortic atherosclerosis. No enlarged abdominal or pelvic lymph nodes. Reproductive: Uterus and bilateral adnexa are unremarkable. Other: Moderate volume free intraperitoneal fluid and a small locules of free intraperitoneal air are seen. These are likely related to peritoneal dialysis. A right retroperitoneal hematoma/seroma measures 7.9 x 6.7 x 4.9 cm, decreased since 02/22/2019 when it measured 9.8 x 8.6 x 6.1 cm. A hematoma/seroma in the right groin measures 4.2 cm in greatest dimension in the sagittal plane, decreased from 02/22/2019 when it measured 5.6 cm. Musculoskeletal: Mild degenerative changes are seen in the lumbar spine. IMPRESSION: 1. Mild scattered bilateral ground-glass opacities in the bilateral lower lobes and left upper lobe likely represent pneumonia. 2. Hepatic cirrhosis. 3. Moderate volume free intraperitoneal fluid and small locules of free intraperitoneal air, likely related to peritoneal dialysis and or hepatic cirrhosis. 4. Continued interval decreased size of fluid collections in the right retroperitoneum and right groin which represent chronic hematoma/seroma. Aortic Atherosclerosis (ICD10-I70.0). Electronically Signed   By: Zerita Boers M.D.   On: 03/12/2019 12:01   CT ABDOMEN PELVIS WO CONTRAST  Result Date: 02/22/2019 CLINICAL DATA:  Persistent right-sided abdominal pain and palpable abnormality. Follow-up retroperitoneal hemorrhage. On peritoneal dialysis. EXAM: CT ABDOMEN AND PELVIS WITHOUT CONTRAST TECHNIQUE: Multidetector CT imaging of the abdomen and pelvis was performed following the standard protocol without IV contrast. COMPARISON:  01/20/2019 FINDINGS: Lower chest: No acute findings.  Hepatobiliary: Hepatic cirrhosis again demonstrated. No hepatic mass visualized on this unenhanced exam. Prior cholecystectomy. No evidence of biliary obstruction. Pancreas: No mass or inflammatory process visualized on this unenhanced exam. Spleen:  Within normal limits in size. Adrenals/Urinary tract: No evidence of urolithiasis or hydronephrosis. Unremarkable unopacified urinary bladder. A peritoneal dialysis catheter is again seen with tip in the pelvis a small amount of intraperitoneal air and fluid is again seen, which is attributable to the peritoneal dialysis. Stomach/Bowel: No evidence of obstruction, inflammatory process, or abnormal fluid collections. Diverticulosis is seen mainly involving the descending and sigmoid colon, however there is no evidence of diverticulitis. Vascular/Lymphatic: No pathologically enlarged lymph nodes identified. No evidence of abdominal aortic aneurysm. Aortic atherosclerosis incidentally noted. Reproductive:  No mass or other significant abnormality. Other: Interval decrease in right retroperitoneal hemorrhage is seen since previous study. A persistent retroperitoneal low-attenuation collection is seen in the right lower quadrant which measures 8.6 x 6.0 cm and likely represents a seroma. A smaller low-attenuation collection is now seen in the right groin which measures 4.2 x 2.4 cm and also likely represents a seroma. Musculoskeletal:  No suspicious bone lesions identified. IMPRESSION: 1. Interval decrease in right retroperitoneal hemorrhage since previous study. 2. Persistent low-attenuation collections in the right lower quadrant retroperitoneum and right groin, likely representing seromas. 3. Hepatic cirrhosis. 4. Mild ascites and free intraperitoneal air again seen, and attributable to peritoneal dialysis. 5. Colonic diverticulosis. No radiographic evidence of diverticulitis. Electronically Signed   By: Marlaine Hind M.D.   On: 02/22/2019 10:50   DG Chest 2  View  Result Date: 03/02/2019 CLINICAL DATA:  Chest pain EXAM: CHEST - 2 VIEW COMPARISON:  02/11/2019 FINDINGS: Heart and mediastinal contours are within normal limits. No focal opacities or effusions. No acute bony abnormality. IMPRESSION: No active cardiopulmonary disease. Electronically Signed   By: Rolm Baptise M.D.   On: 03/02/2019 17:40   CT CHEST WO CONTRAST  Result Date: 03/12/2019 CLINICAL DATA:  Cough, chest  pain, sepsis EXAM: CT CHEST, ABDOMEN AND PELVIS WITHOUT CONTRAST TECHNIQUE: Multidetector CT imaging of the chest, abdomen and pelvis was performed following the standard protocol without IV contrast. COMPARISON:  Chest radiograph dated 03/09/2019 and CT abdomen pelvis dated 02/22/2019 FINDINGS: CT CHEST FINDINGS Cardiovascular: Vascular calcifications are seen in the coronary arteries and aortic arch. Normal heart size. No pericardial effusion. Mediastinum/Nodes: No enlarged mediastinal, hilar, or axillary lymph nodes. Thyroid gland, trachea, and esophagus demonstrate no significant findings. Lungs/Pleura: Mild scattered bilateral ground-glass opacities are seen in the bilateral lower lobes and in the left upper lobe. There is no pleural effusion or pneumothorax. Musculoskeletal: No chest wall mass or suspicious bone lesions identified. CT ABDOMEN PELVIS FINDINGS Hepatobiliary: The liver has a nodular surface contour, consistent with hepatic cirrhosis. No focal abnormality is seen. The patient is status post cholecystectomy. No biliary dilatation is seen. Pancreas: Unremarkable. No pancreatic ductal dilatation or surrounding inflammatory changes. Spleen: Normal in size without focal abnormality. Adrenals/Urinary Tract: Adrenal glands are unremarkable. Kidneys are atrophic, without renal calculi, focal lesion, or hydronephrosis. Bladder is empty. A peritoneal dialysis catheter is seen in the lower central abdomen. 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 There is a small  hiatal hernia. Enteric contrast reaches the distal small bowel. Appendix appears normal. No evidence of bowel wall thickening, distention, or inflammatory changes. Vascular/Lymphatic: Aortic atherosclerosis. No enlarged abdominal or pelvic lymph nodes. Reproductive: Uterus and bilateral adnexa are unremarkable. Other: Moderate volume free intraperitoneal fluid and a small locules of free intraperitoneal air are seen. These are likely related to peritoneal dialysis. A right retroperitoneal hematoma/seroma measures 7.9 x 6.7 x 4.9 cm, decreased since 02/22/2019 when it measured 9.8 x 8.6 x 6.1 cm. A hematoma/seroma in the right groin measures 4.2 cm in greatest dimension in the sagittal plane, decreased from 02/22/2019 when it measured 5.6 cm. Musculoskeletal: Mild degenerative changes are seen in the lumbar spine. IMPRESSION: 1. Mild scattered bilateral ground-glass opacities in the bilateral lower lobes and left upper lobe likely represent pneumonia. 2. Hepatic cirrhosis. 3. Moderate volume free intraperitoneal fluid and small locules of free intraperitoneal air, likely related to peritoneal dialysis and or hepatic cirrhosis. 4. Continued interval decreased size of fluid collections in the right retroperitoneum and right groin which represent chronic hematoma/seroma. Aortic Atherosclerosis (ICD10-I70.0). Electronically Signed   By: Zerita Boers M.D.   On: 03/12/2019 12:01   CARDIAC CATHETERIZATION  Result Date: 03/03/2019  Mid LAD lesion is 75% stenosed.  Ost LAD to Mid LAD lesion is 40% stenosed.  Previously placed Ost 1st Mrg drug eluting stent is widely patent.  Mid Cx to Dist Cx lesion is 55% stenosed with 40% stenosed side branch in Ost 3rd Mrg.  Previously placed Prox RCA drug eluting stent is widely patent.  Ost 1st Mrg to 1st Mrg lesion is 99% stenosed. Partially in the stent and partially distal to the stent- subacte stent thrombosis.  LV end diastolic pressure is normal.  There is no aortic  valve stenosis.  Medical therapy for stent thrombosis of the OM stent.  Given the severe tortuosity, I don't think repeat PCI attempt would be beneficial.  There was significant difficulty getting a stent delivered during the last cath.  I think there would be a high likelihood of completely occluding the vessel with wire manipulation. .   DG CHEST PORT 1 VIEW  Result Date: 03/09/2019 CLINICAL DATA:  Fever. EXAM: PORTABLE CHEST 1 VIEW COMPARISON:  03/08/2019. FINDINGS: Stable mild cardiomegaly. Mild bibasilar infiltrates with  slightly improved aeration from prior exam. Tiny left pleural effusion cannot be excluded. No pneumothorax. IMPRESSION: 1.  Stable mild cardiomegaly. 2. Mild bibasilar infiltrates with slightly improved aeration from prior exam. Tiny left pleural effusion cannot be excluded. Electronically Signed   By: Marcello Moores  Register   On: 03/09/2019 10:41   DG Chest Port 1 View  Result Date: 03/08/2019 CLINICAL DATA:  Fever, cough. EXAM: PORTABLE CHEST 1 VIEW COMPARISON:  03/02/2019. FINDINGS: Trachea is midline. Heart size is accentuated by AP semi upright technique and low lung volumes. Thoracic aorta is calcified. Mild bibasilar airspace opacification, new from 03/02/2019. No definite pleural fluid. IMPRESSION: 1. New mild bibasilar airspace opacification may be due in part to lower lung volumes than on 03/02/2019. The possibility of pneumonia, including due to COVID-19, is also considered. 2.  Aortic atherosclerosis (ICD10-I70.0). Electronically Signed   By: Lorin Picket M.D.   On: 03/08/2019 09:23     TODAY-DAY OF DISCHARGE:  Subjective:   Diana Eves today has no headache,no chest abdominal pain,no new weakness tingling or numbness, feels much better wants to go home today.   Objective:   Blood pressure (!) 147/86, pulse 71, temperature 97.7 F (36.5 C), temperature source Oral, resp. rate 20, height 5' 5"  (1.651 m), weight 130.3 kg, SpO2 97 %.  Intake/Output  Summary (Last 24 hours) at 03/14/2019 1135 Last data filed at 03/14/2019 0930 Gross per 24 hour  Intake 13550 ml  Output 13220 ml  Net 330 ml   Filed Weights   03/13/19 0625 03/13/19 1838 03/14/19 0454  Weight: 126.9 kg 126.6 kg 130.3 kg    Exam: Awake Alert, Oriented *3, No new F.N deficits, Normal affect [place].AT,PERRAL Supple Neck,No JVD, No cervical lymphadenopathy appriciated.  Symmetrical Chest wall movement, Good air movement bilaterally, CTAB RRR,No Gallops,Rubs or new Murmurs, No Parasternal Heave +ve B.Sounds, Abd Soft, Non tender, No organomegaly appriciated, No rebound -guarding or rigidity. No Cyanosis, Clubbing or edema, No new Rash or bruise   PERTINENT RADIOLOGIC STUDIES: CT ABDOMEN PELVIS WO CONTRAST  Result Date: 03/12/2019 CLINICAL DATA:  Cough, chest pain, sepsis EXAM: CT CHEST, ABDOMEN AND PELVIS WITHOUT CONTRAST TECHNIQUE: Multidetector CT imaging of the chest, abdomen and pelvis was performed following the standard protocol without IV contrast. COMPARISON:  Chest radiograph dated 03/09/2019 and CT abdomen pelvis dated 02/22/2019 FINDINGS: CT CHEST FINDINGS Cardiovascular: Vascular calcifications are seen in the coronary arteries and aortic arch. Normal heart size. No pericardial effusion. Mediastinum/Nodes: No enlarged mediastinal, hilar, or axillary lymph nodes. Thyroid gland, trachea, and esophagus demonstrate no significant findings. Lungs/Pleura: Mild scattered bilateral ground-glass opacities are seen in the bilateral lower lobes and in the left upper lobe. There is no pleural effusion or pneumothorax. Musculoskeletal: No chest wall mass or suspicious bone lesions identified. CT ABDOMEN PELVIS FINDINGS Hepatobiliary: The liver has a nodular surface contour, consistent with hepatic cirrhosis. No focal abnormality is seen. The patient is status post cholecystectomy. No biliary dilatation is seen. Pancreas: Unremarkable. No pancreatic ductal dilatation or surrounding  inflammatory changes. Spleen: Normal in size without focal abnormality. Adrenals/Urinary Tract: Adrenal glands are unremarkable. Kidneys are atrophic, without renal calculi, focal lesion, or hydronephrosis. Bladder is empty. A peritoneal dialysis catheter is seen in the lower central abdomen. 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 There is a small hiatal hernia. Enteric contrast reaches the distal small bowel. Appendix appears normal. No evidence of bowel wall thickening, distention, or inflammatory changes. Vascular/Lymphatic: Aortic atherosclerosis. No enlarged abdominal or pelvic lymph nodes. Reproductive:  Uterus and bilateral adnexa are unremarkable. Other: Moderate volume free intraperitoneal fluid and a small locules of free intraperitoneal air are seen. These are likely related to peritoneal dialysis. A right retroperitoneal hematoma/seroma measures 7.9 x 6.7 x 4.9 cm, decreased since 02/22/2019 when it measured 9.8 x 8.6 x 6.1 cm. A hematoma/seroma in the right groin measures 4.2 cm in greatest dimension in the sagittal plane, decreased from 02/22/2019 when it measured 5.6 cm. Musculoskeletal: Mild degenerative changes are seen in the lumbar spine. IMPRESSION: 1. Mild scattered bilateral ground-glass opacities in the bilateral lower lobes and left upper lobe likely represent pneumonia. 2. Hepatic cirrhosis. 3. Moderate volume free intraperitoneal fluid and small locules of free intraperitoneal air, likely related to peritoneal dialysis and or hepatic cirrhosis. 4. Continued interval decreased size of fluid collections in the right retroperitoneum and right groin which represent chronic hematoma/seroma. Aortic Atherosclerosis (ICD10-I70.0). Electronically Signed   By: Zerita Boers M.D.   On: 03/12/2019 12:01   CT ABDOMEN PELVIS WO CONTRAST  Result Date: 02/22/2019 CLINICAL DATA:  Persistent right-sided abdominal pain and palpable abnormality. Follow-up retroperitoneal hemorrhage.  On peritoneal dialysis. EXAM: CT ABDOMEN AND PELVIS WITHOUT CONTRAST TECHNIQUE: Multidetector CT imaging of the abdomen and pelvis was performed following the standard protocol without IV contrast. COMPARISON:  01/20/2019 FINDINGS: Lower chest: No acute findings. Hepatobiliary: Hepatic cirrhosis again demonstrated. No hepatic mass visualized on this unenhanced exam. Prior cholecystectomy. No evidence of biliary obstruction. Pancreas: No mass or inflammatory process visualized on this unenhanced exam. Spleen:  Within normal limits in size. Adrenals/Urinary tract: No evidence of urolithiasis or hydronephrosis. Unremarkable unopacified urinary bladder. A peritoneal dialysis catheter is again seen with tip in the pelvis a small amount of intraperitoneal air and fluid is again seen, which is attributable to the peritoneal dialysis. Stomach/Bowel: No evidence of obstruction, inflammatory process, or abnormal fluid collections. Diverticulosis is seen mainly involving the descending and sigmoid colon, however there is no evidence of diverticulitis. Vascular/Lymphatic: No pathologically enlarged lymph nodes identified. No evidence of abdominal aortic aneurysm. Aortic atherosclerosis incidentally noted. Reproductive:  No mass or other significant abnormality. Other: Interval decrease in right retroperitoneal hemorrhage is seen since previous study. A persistent retroperitoneal low-attenuation collection is seen in the right lower quadrant which measures 8.6 x 6.0 cm and likely represents a seroma. A smaller low-attenuation collection is now seen in the right groin which measures 4.2 x 2.4 cm and also likely represents a seroma. Musculoskeletal:  No suspicious bone lesions identified. IMPRESSION: 1. Interval decrease in right retroperitoneal hemorrhage since previous study. 2. Persistent low-attenuation collections in the right lower quadrant retroperitoneum and right groin, likely representing seromas. 3. Hepatic cirrhosis.  4. Mild ascites and free intraperitoneal air again seen, and attributable to peritoneal dialysis. 5. Colonic diverticulosis. No radiographic evidence of diverticulitis. Electronically Signed   By: Marlaine Hind M.D.   On: 02/22/2019 10:50   DG Chest 2 View  Result Date: 03/02/2019 CLINICAL DATA:  Chest pain EXAM: CHEST - 2 VIEW COMPARISON:  02/11/2019 FINDINGS: Heart and mediastinal contours are within normal limits. No focal opacities or effusions. No acute bony abnormality. IMPRESSION: No active cardiopulmonary disease. Electronically Signed   By: Rolm Baptise M.D.   On: 03/02/2019 17:40   CT CHEST WO CONTRAST  Result Date: 03/12/2019 CLINICAL DATA:  Cough, chest pain, sepsis EXAM: CT CHEST, ABDOMEN AND PELVIS WITHOUT CONTRAST TECHNIQUE: Multidetector CT imaging of the chest, abdomen and pelvis was performed following the standard protocol without IV contrast. COMPARISON:  Chest  radiograph dated 03/09/2019 and CT abdomen pelvis dated 02/22/2019 FINDINGS: CT CHEST FINDINGS Cardiovascular: Vascular calcifications are seen in the coronary arteries and aortic arch. Normal heart size. No pericardial effusion. Mediastinum/Nodes: No enlarged mediastinal, hilar, or axillary lymph nodes. Thyroid gland, trachea, and esophagus demonstrate no significant findings. Lungs/Pleura: Mild scattered bilateral ground-glass opacities are seen in the bilateral lower lobes and in the left upper lobe. There is no pleural effusion or pneumothorax. Musculoskeletal: No chest wall mass or suspicious bone lesions identified. CT ABDOMEN PELVIS FINDINGS Hepatobiliary: The liver has a nodular surface contour, consistent with hepatic cirrhosis. No focal abnormality is seen. The patient is status post cholecystectomy. No biliary dilatation is seen. Pancreas: Unremarkable. No pancreatic ductal dilatation or surrounding inflammatory changes. Spleen: Normal in size without focal abnormality. Adrenals/Urinary Tract: Adrenal glands are  unremarkable. Kidneys are atrophic, without renal calculi, focal lesion, or hydronephrosis. Bladder is empty. A peritoneal dialysis catheter is seen in the lower central abdomen. 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 There is a small hiatal hernia. Enteric contrast reaches the distal small bowel. Appendix appears normal. No evidence of bowel wall thickening, distention, or inflammatory changes. Vascular/Lymphatic: Aortic atherosclerosis. No enlarged abdominal or pelvic lymph nodes. Reproductive: Uterus and bilateral adnexa are unremarkable. Other: Moderate volume free intraperitoneal fluid and a small locules of free intraperitoneal air are seen. These are likely related to peritoneal dialysis. A right retroperitoneal hematoma/seroma measures 7.9 x 6.7 x 4.9 cm, decreased since 02/22/2019 when it measured 9.8 x 8.6 x 6.1 cm. A hematoma/seroma in the right groin measures 4.2 cm in greatest dimension in the sagittal plane, decreased from 02/22/2019 when it measured 5.6 cm. Musculoskeletal: Mild degenerative changes are seen in the lumbar spine. IMPRESSION: 1. Mild scattered bilateral ground-glass opacities in the bilateral lower lobes and left upper lobe likely represent pneumonia. 2. Hepatic cirrhosis. 3. Moderate volume free intraperitoneal fluid and small locules of free intraperitoneal air, likely related to peritoneal dialysis and or hepatic cirrhosis. 4. Continued interval decreased size of fluid collections in the right retroperitoneum and right groin which represent chronic hematoma/seroma. Aortic Atherosclerosis (ICD10-I70.0). Electronically Signed   By: Zerita Boers M.D.   On: 03/12/2019 12:01   CARDIAC CATHETERIZATION  Result Date: 03/03/2019  Mid LAD lesion is 75% stenosed.  Ost LAD to Mid LAD lesion is 40% stenosed.  Previously placed Ost 1st Mrg drug eluting stent is widely patent.  Mid Cx to Dist Cx lesion is 55% stenosed with 40% stenosed side branch in Ost 3rd Mrg.   Previously placed Prox RCA drug eluting stent is widely patent.  Ost 1st Mrg to 1st Mrg lesion is 99% stenosed. Partially in the stent and partially distal to the stent- subacte stent thrombosis.  LV end diastolic pressure is normal.  There is no aortic valve stenosis.  Medical therapy for stent thrombosis of the OM stent.  Given the severe tortuosity, I don't think repeat PCI attempt would be beneficial.  There was significant difficulty getting a stent delivered during the last cath.  I think there would be a high likelihood of completely occluding the vessel with wire manipulation. .   DG CHEST PORT 1 VIEW  Result Date: 03/09/2019 CLINICAL DATA:  Fever. EXAM: PORTABLE CHEST 1 VIEW COMPARISON:  03/08/2019. FINDINGS: Stable mild cardiomegaly. Mild bibasilar infiltrates with slightly improved aeration from prior exam. Tiny left pleural effusion cannot be excluded. No pneumothorax. IMPRESSION: 1.  Stable mild cardiomegaly. 2. Mild bibasilar infiltrates with slightly improved aeration from prior exam.  Tiny left pleural effusion cannot be excluded. Electronically Signed   By: Marcello Moores  Register   On: 03/09/2019 10:41   DG Chest Port 1 View  Result Date: 03/08/2019 CLINICAL DATA:  Fever, cough. EXAM: PORTABLE CHEST 1 VIEW COMPARISON:  03/02/2019. FINDINGS: Trachea is midline. Heart size is accentuated by AP semi upright technique and low lung volumes. Thoracic aorta is calcified. Mild bibasilar airspace opacification, new from 03/02/2019. No definite pleural fluid. IMPRESSION: 1. New mild bibasilar airspace opacification may be due in part to lower lung volumes than on 03/02/2019. The possibility of pneumonia, including due to COVID-19, is also considered. 2.  Aortic atherosclerosis (ICD10-I70.0). Electronically Signed   By: Lorin Picket M.D.   On: 03/08/2019 09:23     PERTINENT LAB RESULTS: CBC: Recent Labs    03/13/19 0237 03/14/19 0544  WBC 12.6* 12.4*  HGB 11.0* 11.2*  HCT 32.8* 34.0*  PLT  297 321   CMET CMP     Component Value Date/Time   NA 132 (L) 03/14/2019 0544   NA 141 12/26/2018 1022   K 3.3 (L) 03/14/2019 0544   CL 91 (L) 03/14/2019 0544   CO2 23 03/14/2019 0544   GLUCOSE 281 (H) 03/14/2019 0544   BUN 49 (H) 03/14/2019 0544   BUN 64 (H) 12/26/2018 1022   CREATININE 13.36 (H) 03/14/2019 0544   CREATININE 2.28 (H) 03/21/2012 0952   CALCIUM 7.5 (L) 03/14/2019 0544   PROT 5.4 (L) 03/14/2019 0544   PROT 6.2 12/26/2018 1022   ALBUMIN 2.3 (L) 03/14/2019 0544   ALBUMIN 3.8 12/26/2018 1022   AST 16 03/14/2019 0544   ALT 14 03/14/2019 0544   ALKPHOS 67 03/14/2019 0544   BILITOT 0.7 03/14/2019 0544   BILITOT 0.3 12/26/2018 1022   GFRNONAA 3 (L) 03/14/2019 0544   GFRAA 3 (L) 03/14/2019 0544    GFR Estimated Creatinine Clearance: 6.6 mL/min (A) (by C-G formula based on SCr of 13.36 mg/dL (H)). No results for input(s): LIPASE, AMYLASE in the last 72 hours. No results for input(s): CKTOTAL, CKMB, CKMBINDEX, TROPONINI in the last 72 hours. Invalid input(s): POCBNP Recent Labs    03/13/19 0237 03/14/19 0544  DDIMER 1.88* 2.22*   No results for input(s): HGBA1C in the last 72 hours. No results for input(s): CHOL, HDL, LDLCALC, TRIG, CHOLHDL, LDLDIRECT in the last 72 hours. No results for input(s): TSH, T4TOTAL, T3FREE, THYROIDAB in the last 72 hours.  Invalid input(s): FREET3 Recent Labs    03/13/19 0237 03/14/19 0544  FERRITIN 3,492* 3,228*   Coags: No results for input(s): INR in the last 72 hours.  Invalid input(s): PT Microbiology: Recent Results (from the past 240 hour(s))  Culture, blood (routine x 2)     Status: Abnormal   Collection Time: 03/08/19  1:15 AM   Specimen: BLOOD  Result Value Ref Range Status   Specimen Description BLOOD RIGHT HAND  Final   Special Requests   Final    BOTTLES DRAWN AEROBIC AND ANAEROBIC Blood Culture adequate volume   Culture  Setup Time   Final    GRAM POSITIVE COCCI IN CLUSTERS ANAEROBIC BOTTLE  ONLY CRITICAL RESULT CALLED TO, READ BACK BY AND VERIFIED WITHAlona Bene PHARMD, AT [place] 03/09/19 BY D.VANHOOK Performed at [facility], [place] [address] [place], [place] [address]    Culture STAPHYLOCOCCUS CAPITIS (A)  Final   Report Status 03/12/2019 FINAL  Final   Organism ID, Bacteria STAPHYLOCOCCUS CAPITIS  Final      Susceptibility  Staphylococcus capitis - MIC*    CIPROFLOXACIN <=0.5 SENSITIVE Sensitive     ERYTHROMYCIN >=8 RESISTANT Resistant     GENTAMICIN <=0.5 SENSITIVE Sensitive     OXACILLIN <=0.25 SENSITIVE Sensitive     TETRACYCLINE <=1 SENSITIVE Sensitive     VANCOMYCIN <=0.5 SENSITIVE Sensitive     TRIMETH/SULFA <=10 SENSITIVE Sensitive     CLINDAMYCIN INTERMEDIATE Intermediate     RIFAMPIN <=0.5 SENSITIVE Sensitive     Inducible Clindamycin NEGATIVE Sensitive     * STAPHYLOCOCCUS CAPITIS  Culture, blood (routine x 2)     Status: None   Collection Time: 03/08/19  1:24 AM   Specimen: BLOOD  Result Value Ref Range Status   Specimen Description BLOOD RIGHT ARM  Final   Special Requests   Final    BOTTLES DRAWN AEROBIC AND ANAEROBIC Blood Culture adequate volume   Culture   Final    NO GROWTH 5 DAYS Performed at [facility], [place] [address], [place], [place] [address]    Report Status 03/13/2019 FINAL  Final  Culture, body fluid-bottle     Status: None   Collection Time: 03/08/19  8:58 PM   Specimen: Peritoneal Dialysate  Result Value Ref Range Status   Specimen Description PERITONEAL DIALYSATE  Final   Special Requests BOTTLES DRAWN AEROBIC AND ANAEROBIC  Final   Culture   Final    NO GROWTH 5 DAYS Performed at [facility], [place] [address], [place], [place] [address]    Report Status 03/13/2019 FINAL  Final  Gram stain     Status: None   Collection Time: 03/08/19  8:58 PM   Specimen: Peritoneal Dialysate  Result Value Ref Range Status   Specimen Description PERITONEAL DIALYSATE  Final   Special Requests NONE  Final   Gram Stain   Final     WBC PRESENT, PREDOMINANTLY MONONUCLEAR NO ORGANISMS SEEN CYTOSPIN SMEAR Performed at [facility], [place] [address] [place], [place] [address]    Report Status 03/08/2019 FINAL  Final  SARS CORONAVIRUS 2 (TAT 6-24 HRS) Nasopharyngeal Nasopharyngeal Swab     Status: Abnormal   Collection Time: 03/09/19 11:10 AM   Specimen: Nasopharyngeal Swab  Result Value Ref Range Status   SARS Coronavirus 2 POSITIVE (A) NEGATIVE Final    Comment: RESULT CALLED TO, READ BACK BY AND VERIFIED WITH: G COOPER,RN [id number] 03/09/2019 D BRADLEY (NOTE) SARS-CoV-2 target nucleic acids are DETECTED. The SARS-CoV-2 RNA is generally detectable in upper and lower respiratory specimens during the acute phase of infection. Positive results are indicative of the presence of SARS-CoV-2 RNA. Clinical correlation with patient history and other diagnostic information is  necessary to determine patient infection status. Positive results do not rule out bacterial infection or co-infection with other viruses.  The expected result is Negative. Fact Sheet for Patients: SugarRoll.be Fact Sheet for Healthcare Providers: https://www.woods-mathews.com/ This test is not yet approved or cleared by the [place] FDA and  has been authorized for detection and/or diagnosis of SARS-CoV-2 by FDA under an Emergency Use Authorization (EUA). This EUA will remain  in effect (meaning this test can be used) for th e duration of the COVID-19 declaration under Section 564(b)(1) of the Act, 21 U.S.C. section 360bbb-3(b)(1), unless the authorization is terminated or revoked sooner. Performed at [facility], [place] [address], [place], [place] [address]   MRSA PCR Screening     Status: None   Collection Time: 03/10/19  7:32 AM   Specimen:  Nasopharyngeal  Result Value Ref Range Status   MRSA by PCR NEGATIVE NEGATIVE Final    Comment:        The GeneXpert MRSA Assay (FDA approved for  NASAL specimens only), is one component of a comprehensive MRSA colonization surveillance program. It is not intended to diagnose MRSA infection nor to guide or monitor treatment for MRSA infections. Performed at [facility], [place] [address] [place], [place] [address]     FURTHER DISCHARGE INSTRUCTIONS:  Get Medicines reviewed and adjusted: Please take all your medications with you for your next visit with your Primary MD  Laboratory/radiological data: Please request your Primary MD to go over all hospital tests and procedure/radiological results at the follow up, please ask your Primary MD to get all Hospital records sent to his/her office.  In some cases, they will be blood work, cultures and biopsy results pending at the time of your discharge. Please request that your primary care M.D. goes through all the records of your hospital data and follows up on these results.  Also Note the following: If you experience worsening of your admission symptoms, develop shortness of breath, life threatening emergency, suicidal or homicidal thoughts you must seek medical attention immediately by calling 911 or calling your MD immediately  if symptoms less severe.  You must read complete instructions/literature along with all the possible adverse reactions/side effects for all the Medicines you take and that have been prescribed to you. Take any new Medicines after you have completely understood and accpet all the possible adverse reactions/side effects.   Do not drive when taking Pain medications or sleeping medications (Benzodaizepines)  Do not take more than prescribed Pain, Sleep and Anxiety Medications. It is not advisable to combine anxiety,sleep and pain medications without talking with your primary care practitioner  Special Instructions: If you have smoked or chewed Tobacco  in the last 2 yrs please stop smoking, stop any regular Alcohol  and or any Recreational drug use.  Wear  Seat belts while driving.  Please note: You were cared for by a hospitalist during your hospital stay. Once you are discharged, your primary care physician will handle any further medical issues. Please note that NO REFILLS for any discharge medications will be authorized once you are discharged, as it is imperative that you return to your primary care physician (or establish a relationship with a primary care physician if you do not have one) for your post hospital discharge needs so that they can reassess your need for medications and monitor your lab values.  Total Time spent coordinating discharge including counseling, education and face to face time equals 35 minutes.  SignedOren Binet 03/14/2019 11:35 AM

## 2019-03-15 ENCOUNTER — Encounter (HOSPITAL_COMMUNITY): Payer: Self-pay

## 2019-03-15 NOTE — Telephone Encounter (Signed)
Attempted to call patient in regards to Cardiac Rehab - LM on VM Mailed letter 

## 2019-03-16 ENCOUNTER — Ambulatory Visit: Payer: BC Managed Care – PPO | Admitting: Physician Assistant

## 2019-03-22 ENCOUNTER — Ambulatory Visit (INDEPENDENT_AMBULATORY_CARE_PROVIDER_SITE_OTHER): Payer: BC Managed Care – PPO | Admitting: Family

## 2019-03-22 DIAGNOSIS — M109 Gout, unspecified: Secondary | ICD-10-CM | POA: Diagnosis not present

## 2019-03-22 MED ORDER — PREDNISONE 20 MG PO TABS: 20.0000 mg | ORAL_TABLET | Freq: Every day | ORAL | 0 refills | Status: DC

## 2019-03-22 MED ORDER — HYDROCODONE-ACETAMINOPHEN 5-325 MG PO TABS: 1.0000 | ORAL_TABLET | Freq: Four times a day (QID) | ORAL | 0 refills | Status: DC | PRN

## 2019-03-22 NOTE — Progress Notes (Signed)
Brenda Davis is a 54 y.o. female with the following history as recorded in EpicCare:  Patient Active Problem List   Diagnosis Date Noted  . Unstable angina ([place]) 03/03/2019  . Chest pain 03/02/2019  . Retroperitoneal hemorrhage - post Cath/PCI - taken to OR for drainage & CFA repair 01/18/2019  . Acute blood loss anemia - resolved, transfused in OR - Hgb stable 01/18/2019  . Increased anion gap metabolic acidosis 40/08/6759  . Shock circulatory ([place]) - secondary to acute blood loss anemia, elevated WBC (? sepsis vs post-op inflammation).h 01/18/2019  . Postprocedural hypotension   . Hyperkalemia   . Dysuria 11/25/2018  . Coronary artery disease involving native coronary artery of native heart with unstable angina pectoris ([place])   . Hypocalcemia 06/18/2018  . Chest pain, rule out acute myocardial infarction 06/18/2018  . Localized swelling of finger of left hand 06/01/2018  . Sepsis ([place]) 05/23/2018  . C. difficile diarrhea 05/08/2018  . Diarrhea of infectious origin 05/04/2018  . Hemodialysis-associated hypotension 04/28/2018  . Elevated troponin 03/08/2018  . Folate deficiency 03/08/2018  . NSTEMI (non-ST elevated myocardial infarction) ([place])   . CAD in native artery   . Hepatic cirrhosis ([place]) 11/24/2017  . ESRD on peritoneal dialysis ([place]) 08/25/2017  . DM (diabetes mellitus), type 2 with renal complications ([place]) 95/09/3265  . Chronic heart failure with preserved ejection fraction ([place]) 06/22/2017  . Trigger finger, left middle finger 09/14/2016  . Sensorineural hearing loss (SNHL), bilateral 06/29/2016  . Allergic rhinitis 06/27/2016  . Severe persistent asthma with exacerbation 05/12/2016  . Acute respiratory failure with hypoxia and hypercapnia ([place]) 05/06/2016  . Anemia of chronic disease 05/06/2016  . Routine general medical examination at a health care facility 12/05/2015  . Cervical cancer screening 12/05/2015  . Visit for screening mammogram 01/23/2015   . Gout due to renal impairment 09/13/2014  . Thiamine deficiency 12/02/2012  . Hypokalemia 11/28/2012  . S/P laparoscopic sleeve gastrectomy 11/28/2012  . B12 deficiency anemia 11/28/2012  . Morbid obesity with BMI of 40.0-44.9, adult ([place]) 07/22/2012  . Dyslipidemia, goal LDL below 70 04/09/2008  . CKD (chronic kidney disease) stage V requiring chronic dialysis (MWF) 02/08/2008    Current Outpatient Medications  Medication Sig Dispense Refill  . Amino Acids-Protein Hydrolys (FEEDING SUPPLEMENT, PRO-STAT SUGAR FREE 64,) LIQD Take 30 mLs by mouth 2 (two) times daily. 1800 mL 0  . aspirin EC 81 MG EC tablet Take 1 tablet (81 mg total) by mouth daily. 30 tablet 0  . calcium acetate (PHOSLO) 667 MG capsule Take 667 mg by mouth 3 (three) times daily with meals.    . cinacalcet (SENSIPAR) 90 MG tablet Take 90 mg by mouth at bedtime.    . Cyanocobalamin (B-12 PO) Take 1 tablet by mouth daily.    . ferric citrate (AURYXIA) 1 GM 210 MG(Fe) tablet Take 2 tablets (420 mg total) by mouth 3 (three) times daily with meals. 124 tablet 0  . folic acid (FOLVITE) 1 MG tablet Take 1 tablet (1 mg total) by mouth daily. 90 tablet 1  . gabapentin (NEURONTIN) 100 MG capsule Take 100 mg by mouth 2 (two) times daily.    Marland Kitchen gentamicin cream (GARAMYCIN) 0.1 % Apply 1 application topically See admin instructions. Apply to exit site daily as directed.  4  . HYDROcodone-acetaminophen (NORCO) 5-325 MG tablet Take 1 tablet by mouth every 6 (six) hours as needed for moderate pain. 15 tablet 0  . insulin aspart (NOVOLOG FLEXPEN) 100 UNIT/ML FlexPen 0-6  Units, Subcutaneous, 3 times daily with meals, First dose on Fri 03/03/19 at 0800 Correction coverage: Very Sensitive (ESRD/Dialysis) CBG < 70: Implement Hypoglycemia measures/Call MD CBG 70 - 120: 0 units CBG 121 - 150: 0 units CBG 151 - 200: 1 unit CBG 201-250: 2 units CBG 251-300: 3 units CBG 301-350: 4 units CBG 351-400: 5 units CBG > 400: Give 6 units and call MD 15 mL 0  .  Insulin Pen Needle 32G X 8 MM MISC Use as directed 100 each 0  . isosorbide dinitrate (ISORDIL) 10 MG tablet Take 1 tablet (10 mg total) by mouth 2 (two) times daily. 120 tablet 0  . nitroGLYCERIN (NITROSTAT) 0.4 MG SL tablet PLACE 1 TABLET (0.4 MG TOTAL) UNDER THE TONGUE EVERY 5 (FIVE) MINUTES AS NEEDED FOR CHEST PAIN. 50 tablet 0  . ondansetron (ZOFRAN ODT) 8 MG disintegrating tablet Take 1 tablet (8 mg total) by mouth every 8 (eight) hours as needed for nausea or vomiting. 10 tablet 0  . pantoprazole (PROTONIX) 40 MG tablet Take 1 tablet (40 mg total) by mouth daily. 30 tablet 0  . predniSONE (DELTASONE) 20 MG tablet Take 1 tablet (20 mg total) by mouth daily with breakfast. 5 tablet 0  . rosuvastatin (CRESTOR) 10 MG tablet Take 1 tablet (10 mg total) by mouth daily. 90 tablet 3  . ticagrelor (BRILINTA) 90 MG TABS tablet Take 1 tablet (90 mg total) by mouth 2 (two) times daily. 180 tablet 3   No current facility-administered medications for this visit.    Allergies: Amlodipine, Atorvastatin, Clonidine derivatives, Doxycycline, and Welchol [colesevelam hcl]  Past Medical History:  Diagnosis Date  . Anemia   . CAD (coronary artery disease)    a. cath in 02/2018 showing moderate 3-vessel CAD with 45% mid-LADm 55% LCx and 65% RCA stenosis which was not significant by FFR  . ESRD on dialysis ([place])   . Gout   . HCAP (healthcare-associated pneumonia) 05/06/2016  . History of blood transfusion    "related to surgery"  . Hyperlipidemia   . Hypertension   . Morbid obesity ([place])   . Pain    LEFT SHOULDER PAIN - WAS SEEN AT AN URGENT CARE - GIVEN SLING FOR COMFORT AND TOLD ROM AS TOLERATED.  Marland Kitchen Palpitations 09/24/2016  . Peritonitis, dialysis-associated ([place])   . Type II diabetes mellitus ([place])    "gastric sleeve OR corrected this" (05/06/2016)    Past Surgical History:  Procedure Laterality Date  . AV FISTULA PLACEMENT Left 01/03/2015   Procedure: BRACHIAL CEPHALIC ARTERIOVENOUS  FISTULA  CREATION LEFT ARM;  Surgeon: Angelia Mould, MD;  Location: [facility];  Service: Vascular;  Laterality: Left;  . CARPAL TUNNEL RELEASE Right   . [place]  . CHOLECYSTECTOMY  09/14/2017  . CORONARY STENT INTERVENTION N/A 01/17/2019   Procedure: CORONARY STENT INTERVENTION;  Surgeon: Jettie Booze, MD;  Location: [facility];  Service: Cardiovascular;  Laterality: N/A;  . ESOPHAGOGASTRODUODENOSCOPY N/A 09/04/2012   Procedure: ESOPHAGOGASTRODUODENOSCOPY (EGD);  Surgeon: Shann Medal, MD;  Location: [facility];  Service: General;  Laterality: N/A;  PF  . ESOPHAGOGASTRODUODENOSCOPY (EGD) WITH ESOPHAGEAL DILATION N/A 09/29/2012   Procedure: ESOPHAGOGASTRODUODENOSCOPY (EGD) WITH ESOPHAGEAL DILATION;  Surgeon: Milus Banister, MD;  Location: [facility];  Service: Endoscopy;  Laterality: N/A;  . FEMORAL ARTERY EXPLORATION Right 01/17/2019   Procedure: Exploration Right Groin with Primary Closure or Arteriotomy Site; Washout of Retroperitoneal Hematoma;  Surgeon: Waynetta Sandy, MD;  Location:  [facility];  Service: Vascular;  Laterality: Right;  . INSERTION OF DIALYSIS CATHETER N/A 01/03/2015   Procedure: INSERTION OF DIALYSIS CATHETER RIGHT INTERNAL JUGULAR;  Surgeon: Angelia Mould, MD;  Location: [facility];  Service: Vascular;  Laterality: N/A;  . LAPAROSCOPIC GASTRIC SLEEVE RESECTION N/A 07/19/2012   Procedure: LAPAROSCOPIC SLEEVE GASTRECTOMY with EGD;  Surgeon: Madilyn Hook, DO;  Location: [facility];  Service: General;  Laterality: N/A;  laparoscopic sleeve gastrectomy with EGD  . LEFT HEART CATH AND CORONARY ANGIOGRAPHY N/A 02/25/2018   Procedure: LEFT HEART CATH AND CORONARY ANGIOGRAPHY;  Surgeon: Leonie Man, MD;  Location: [facility];  Service: Cardiovascular;  Laterality: N/A;  . LEFT HEART CATH AND CORONARY ANGIOGRAPHY N/A 06/20/2018   Procedure: LEFT HEART CATH AND CORONARY ANGIOGRAPHY;  Surgeon: Nelva Bush, MD;  Location: [facility];  Service: Cardiovascular;  Laterality: N/A;  . LEFT HEART CATH AND CORONARY ANGIOGRAPHY N/A 01/16/2019   Procedure: LEFT HEART CATH AND CORONARY ANGIOGRAPHY;  Surgeon: Jettie Booze, MD;  Location: [facility];  Service: Cardiovascular;  Laterality: N/A;  . LEFT HEART CATH AND CORONARY ANGIOGRAPHY N/A 03/03/2019   Procedure: LEFT HEART CATH AND CORONARY ANGIOGRAPHY;  Surgeon: Jettie Booze, MD;  Location: [facility];  Service: Cardiovascular;  Laterality: N/A;  . PERIPHERAL VASCULAR CATHETERIZATION Left 05/23/2015   Procedure: Nolon Stalls;  Surgeon: Conrad Albion, MD;  Location: [facility];  Service: Cardiovascular;  Laterality: Left;  upper aRM  . PERIPHERAL VASCULAR CATHETERIZATION Left 05/23/2015   Procedure: Peripheral Vascular Balloon Angioplasty;  Surgeon: Conrad Galesburg, MD;  Location: [facility];  Service: Cardiovascular;  Laterality: Left;  av fistula  . TUBAL LIGATION  1993  . UPPER GI ENDOSCOPY N/A 07/19/2012   Procedure: UPPER GI ENDOSCOPY;  Surgeon: Madilyn Hook, DO;  Location: [facility];  Service: General;  Laterality: N/A;    Family History  Problem Relation Age of Onset  . Hypertension Mother   . Mitral valve prolapse Mother   . Diabetes Father   . Arthritis Other   . Hyperlipidemia Other   . Cancer Neg Hx   . Heart disease Neg Hx   . Kidney disease Neg Hx   . Stroke Neg Hx     Social History   Tobacco Use  . Smoking status: Former Smoker    Packs/day: 1.00    Years: 16.00    Pack years: 16.00    Types: Cigarettes    Quit date: 2009    Years since quitting: 12.1  . Smokeless tobacco: Never Used  Substance Use Topics  . Alcohol use: Never    Subjective:   I connected with ANDEE CHIVERS on 03/22/19 at 12:00 PM EST by a video enabled telemedicine application and verified that I am speaking with the correct person using two identifiers.   I discussed the limitations of evaluation and management by telemedicine  and the availability of in person appointments. The patient expressed understanding and agreed to proceed. Provider in office/ patient is at home; provider and patient are only 2 people on video call.   Patient was admitted to hospital on 03/02/19 with NSTEMI/ had cardiac cath; while in the hospital she was found to be COVID + so cannot be seen in office today; actually doing well from cardiac standpoint but is having gout flare in her left foot; requesting prescription for prednisone; notes that blood sugars have been well controlled recently; notes that  area is localized over 5th toe and painful to have sheet touch her skin;     Objective:  There were no vitals filed for this visit.  General: Well developed, well nourished, in no acute distress  Head: Normocephalic and atraumatic  Lungs: Respirations unlabored;  Neurologic: Alert and oriented; speech intact; face symmetrical;   Assessment:  1. Acute gout, unspecified cause, unspecified site     Plan:  Rx for Prednisone 20 mg qd x 5 days; Norco #15 due to pain; she will plan to see her PCP in office in about 2 weeks.  No follow-ups on file.  No orders of the defined types were placed in this encounter.   Requested Prescriptions   Signed Prescriptions Disp Refills  . predniSONE (DELTASONE) 20 MG tablet 5 tablet 0    Sig: Take 1 tablet (20 mg total) by mouth daily with breakfast.  . HYDROcodone-acetaminophen (NORCO) 5-325 MG tablet 15 tablet 0    Sig: Take 1 tablet by mouth every 6 (six) hours as needed for moderate pain.

## 2019-03-24 ENCOUNTER — Telehealth: Payer: Self-pay

## 2019-03-24 ENCOUNTER — Other Ambulatory Visit: Payer: Self-pay | Admitting: Cardiovascular Disease

## 2019-03-24 NOTE — Telephone Encounter (Signed)
She could go to [facility] to get steroid shot; I gave her prednisone and pain medication. I don't have much else to offer.

## 2019-03-24 NOTE — Telephone Encounter (Signed)
I spoke with patient today and recommendations given.

## 2019-03-24 NOTE — Telephone Encounter (Signed)
New Message     *STAT* If patient is at the pharmacy, call can be transferred to refill team.   1. Which medications need to be refilled? (please list name of each medication and dose if known)  nitroGLYCERIN (NITROSTAT) 0.4 MG SL tablet  2. Which pharmacy/location (including street and city if local pharmacy) is medication to be sent to? [id number] [place], [place] - [address]  3. Do they need a 30 day or 90 day supply? [address]

## 2019-03-24 NOTE — Telephone Encounter (Signed)
New message   Seen Brenda Davis on  3.3.2021  The patient calling C/o  No relief from a gout flare-up, asking for a call back from the nurse.   Please advise.

## 2019-03-29 ENCOUNTER — Telehealth: Payer: Self-pay | Admitting: Cardiovascular Disease

## 2019-03-29 NOTE — Telephone Encounter (Signed)
New Message  Patient c/o Palpitations:  High priority if patient c/o lightheadedness, shortness of breath, or chest pain  1) How long have you had palpitations/irregular HR/ Afib? Are you having the symptoms now? Yesterday  2) Are you currently experiencing lightheadedness, SOB or CP? No  3) Do you have a history of afib (atrial fibrillation) or irregular heart rhythm? Yes  4) Have you checked your BP or HR? (document readings if available): 115/70; 76  5) Are you experiencing any other symptoms? Extreme fatigue, feel like heart is trying to catch up

## 2019-03-29 NOTE — Telephone Encounter (Signed)
Returned call to pt she states that she feels extremely fatigued for the last 2 days.  She does not know what this is. She feels like her heart is skipping a beat. And will wake her up when she is sleeping twice now. She is concerned because she is in quarantine for COVID. Tested + for COVID on 2-18 she was prev negative. She states that she was told to quarantine until 03-30-2019. While she was in the hospital.  I will discuss this with Denyse Amass for appt tomorrow

## 2019-03-29 NOTE — Telephone Encounter (Signed)
Left detailed message, [facility]

## 2019-03-30 ENCOUNTER — Telehealth: Payer: BC Managed Care – PPO | Admitting: General Practice

## 2019-03-30 ENCOUNTER — Encounter (HOSPITAL_COMMUNITY): Payer: Self-pay | Admitting: *Deleted

## 2019-03-30 NOTE — Telephone Encounter (Signed)
Discussed this appt with Adline Potter he states that pt would be best served with an OV appt. Cancelled this VV and scheduled new appt with TR 03-31-2019. S/w pt she is agreeable to this plan she will arrive early alone and wearing a mask

## 2019-03-30 NOTE — Progress Notes (Signed)
No response from please contact letter sent to pt on 03/15/19.  Will close this referral for cardiac rehab. Cherre Huger, BSN Cardiac and Training and development officer

## 2019-03-31 ENCOUNTER — Ambulatory Visit: Payer: BC Managed Care – PPO | Admitting: Cardiovascular Disease

## 2019-04-04 ENCOUNTER — Ambulatory Visit (INDEPENDENT_AMBULATORY_CARE_PROVIDER_SITE_OTHER): Payer: BC Managed Care – PPO

## 2019-04-04 ENCOUNTER — Telehealth: Payer: Self-pay

## 2019-04-04 ENCOUNTER — Encounter: Payer: Self-pay | Admitting: Internal Medicine

## 2019-04-04 ENCOUNTER — Other Ambulatory Visit: Payer: Self-pay

## 2019-04-04 ENCOUNTER — Ambulatory Visit (INDEPENDENT_AMBULATORY_CARE_PROVIDER_SITE_OTHER): Payer: BC Managed Care – PPO | Admitting: Internal Medicine

## 2019-04-04 VITALS — BP 124/80 | HR 68 | Temp 98.2°F | Resp 16 | Ht 65.0 in | Wt 281.2 lb

## 2019-04-04 DIAGNOSIS — R059 Cough, unspecified: Secondary | ICD-10-CM

## 2019-04-04 DIAGNOSIS — R05 Cough: Secondary | ICD-10-CM | POA: Diagnosis not present

## 2019-04-04 DIAGNOSIS — E876 Hypokalemia: Secondary | ICD-10-CM | POA: Diagnosis not present

## 2019-04-04 DIAGNOSIS — D539 Nutritional anemia, unspecified: Secondary | ICD-10-CM | POA: Diagnosis not present

## 2019-04-04 DIAGNOSIS — E1122 Type 2 diabetes mellitus with diabetic chronic kidney disease: Secondary | ICD-10-CM

## 2019-04-04 DIAGNOSIS — N185 Chronic kidney disease, stage 5: Secondary | ICD-10-CM

## 2019-04-04 DIAGNOSIS — I5032 Chronic diastolic (congestive) heart failure: Secondary | ICD-10-CM

## 2019-04-04 LAB — BASIC METABOLIC PANEL
BUN: 51 mg/dL — ABNORMAL HIGH (ref 6–23)
CO2: 27 mEq/L (ref 19–32)
Calcium: 7.5 mg/dL — ABNORMAL LOW (ref 8.4–10.5)
Chloride: 98 mEq/L (ref 96–112)
Creatinine, Ser: 11.91 mg/dL (ref 0.40–1.20)
GFR: 4 mL/min — CL (ref >60.00)
Glucose, Bld: 193 mg/dL — ABNORMAL HIGH (ref 70–99)
Potassium: 3.2 mEq/L — ABNORMAL LOW (ref 3.5–5.1)
Sodium: 140 mEq/L (ref 135–145)

## 2019-04-04 LAB — IBC PANEL
Iron: 55 ug/dL (ref 42–145)
Saturation Ratios: 23.5 % (ref 20.0–50.0)
Transferrin: 167 mg/dL — ABNORMAL LOW (ref 212.0–360.0)

## 2019-04-04 LAB — CBC WITH DIFFERENTIAL/PLATELET
Basophils Absolute: 0 10*3/uL (ref 0.0–0.1)
Basophils Relative: 0.2 % (ref 0.0–3.0)
Eosinophils Absolute: 0.3 10*3/uL (ref 0.0–0.7)
Eosinophils Relative: 3.6 % (ref 0.0–5.0)
HCT: 31.6 % — ABNORMAL LOW (ref 36.0–46.0)
Hemoglobin: 10.2 g/dL — ABNORMAL LOW (ref 12.0–15.0)
Lymphocytes Relative: 14.4 % (ref 12.0–46.0)
Lymphs Abs: 1.2 10*3/uL (ref 0.7–4.0)
MCHC: 32.3 g/dL (ref 30.0–36.0)
MCV: 99.4 fl (ref 78.0–100.0)
Monocytes Absolute: 1.1 10*3/uL — ABNORMAL HIGH (ref 0.1–1.0)
Monocytes Relative: 12.9 % — ABNORMAL HIGH (ref 3.0–12.0)
Neutro Abs: 5.7 10*3/uL (ref 1.4–7.7)
Neutrophils Relative %: 68.9 % (ref 43.0–77.0)
Platelets: 333 10*3/uL (ref 150.0–400.0)
RBC: 3.18 Mil/uL — ABNORMAL LOW (ref 3.87–5.11)
RDW: 18.4 % — ABNORMAL HIGH (ref 11.5–15.5)
WBC: 8.3 10*3/uL (ref 4.0–10.5)

## 2019-04-04 LAB — MAGNESIUM: Magnesium: 1.9 mg/dL (ref 1.5–2.5)

## 2019-04-04 LAB — VITAMIN B12: Vitamin B-12: 295 pg/mL (ref 211–911)

## 2019-04-04 LAB — HEMOGLOBIN A1C: Hgb A1c MFr Bld: 6.4 % (ref 4.6–6.5)

## 2019-04-04 LAB — FERRITIN: Ferritin: 1417.8 ng/mL — ABNORMAL HIGH (ref 10.0–291.0)

## 2019-04-04 LAB — FOLATE: Folate: 10.2 ng/mL (ref >5.9)

## 2019-04-04 NOTE — Patient Instructions (Signed)
Anemia  Anemia is a condition in which you do not have enough red blood cells or hemoglobin. Hemoglobin is a substance in red blood cells that carries oxygen. When you do not have enough red blood cells or hemoglobin (are anemic), your body cannot get enough oxygen and your organs may not work properly. As a result, you may feel very tired or have other problems. What are the causes? Common causes of anemia include:  Excessive bleeding. Anemia can be caused by excessive bleeding inside or outside the body, including bleeding from the intestine or from periods in women.  Poor nutrition.  Long-lasting (chronic) kidney, thyroid, and liver disease.  Bone marrow disorders.  Cancer and treatments for cancer.  HIV (human immunodeficiency virus) and AIDS (acquired immunodeficiency syndrome).  Treatments for HIV and AIDS.  Spleen problems.  Blood disorders.  Infections, medicines, and autoimmune disorders that destroy red blood cells. What are the signs or symptoms? Symptoms of this condition include:  Minor weakness.  Dizziness.  Headache.  Feeling heartbeats that are irregular or faster than normal (palpitations).  Shortness of breath, especially with exercise.  Paleness.  Cold sensitivity.  Indigestion.  Nausea.  Difficulty sleeping.  Difficulty concentrating. Symptoms may occur suddenly or develop slowly. If your anemia is mild, you may not have symptoms. How is this diagnosed? This condition is diagnosed based on:  Blood tests.  Your medical history.  A physical exam.  Bone marrow biopsy. Your health care provider may also check your stool (feces) for blood and may do additional testing to look for the cause of your bleeding. You may also have other tests, including:  Imaging tests, such as a CT scan or MRI.  Endoscopy.  Colonoscopy. How is this treated? Treatment for this condition depends on the cause. If you continue to lose a lot of blood, you may  need to be treated at a hospital. Treatment may include:  Taking supplements of iron, vitamin S31, or folic acid.  Taking a hormone medicine (erythropoietin) that can help to stimulate red blood cell growth.  Having a blood transfusion. This may be needed if you lose a lot of blood.  Making changes to your diet.  Having surgery to remove your spleen. Follow these instructions at home:  Take over-the-counter and prescription medicines only as told by your health care provider.  Take supplements only as told by your health care provider.  Follow any diet instructions that you were given.  Keep all follow-up visits as told by your health care provider. This is important. Contact a health care provider if:  You develop new bleeding anywhere in the body. Get help right away if:  You are very weak.  You are short of breath.  You have pain in your abdomen or chest.  You are dizzy or feel faint.  You have trouble concentrating.  You have bloody or black, tarry stools.  You vomit repeatedly or you vomit up blood. Summary  Anemia is a condition in which you do not have enough red blood cells or enough of a substance in your red blood cells that carries oxygen (hemoglobin).  Symptoms may occur suddenly or develop slowly.  If your anemia is mild, you may not have symptoms.  This condition is diagnosed with blood tests as well as a medical history and physical exam. Other tests may be needed.  Treatment for this condition depends on the cause of the anemia. This information is not intended to replace advice given to you by  your health care provider. Make sure you discuss any questions you have with your health care provider. Document Revised: 12/18/2016 Document Reviewed: 02/07/2016 Elsevier Patient Education  [place].

## 2019-04-04 NOTE — Progress Notes (Signed)
Subjective:  Patient ID: Brenda Davis, female    DOB: 1965-11-13  Age: 54 y.o. MRN: [id number]  CC: Cough and Anemia   This visit occurred during the SARS-CoV-2 public health emergency.  Safety protocols were in place, including screening questions prior to the visit, additional usage of staff PPE, and extensive cleaning of exam room while observing appropriate contact time as indicated for disinfecting solutions.    HPI [name] presents for hosp f/up - She was recently admitted for complications related to COVID-19 infection.  She tells me in some ways she feels better but she continues to complain of a 1 month history of nonproductive cough that mostly occurs at night.  She also complains of DOE but denies chest pain.  She complains of fatigue, weakness, dizziness, and lightheadedness.  She is being followed by nephrology and is being treated for vitamin deficiencies and hypokalemia.  She tells me she saw her nephrologist earlier today.  Attached Notes  Discharge Summary by Jonetta Osgood, MD at 03/14/2019 11:00 AM  Author: Jonetta Osgood, MD Service: Hospitalist Author Type: Physician  Filed: 03/14/2019 11:35 AM Date of Service: 03/14/2019 11:00 AM Note Type: Discharge Summary  Status: Addendum Editor: Jonetta Osgood, MD (Physician)  Related Notes: Original Note by Jonetta Osgood, MD (Physician) filed at 03/14/2019 11:07 AM  Expand AllCollapse All     PATIENT DETAILS Name: Brenda Davis Age: 54 y.o. Sex: female Date of Birth: May 25, 1965 MRN: [id number]. Admitting Physician: Geradine Girt, DO DVV:OHYWV, Arvid Right, MD  Admit Date: 03/02/2019 Discharge date: 03/14/2019  Recommendations for Outpatient Follow-up:  1. Follow up with PCP in 1-2 weeks 2. Please obtain CMP/CBC in one week 3. Repeat Chest Xray in 4-6 week 4. Please ensure follow up with cardiology  Admitted From:  Home  Disposition: Home with home  health services   Home Health:  Yes  Equipment/Devices: None  Discharge Condition: Stable  CODE STATUS: FULL CODE  Diet recommendation:     Diet Order                  Diet - low sodium heart healthy         Diet Carb Modified         Diet heart healthy/carb modified Room service appropriate? Yes; Fluid consistency: Thin  Diet effective now                Brief Summary: See H&P, Labs, Consult and Test reports for all details in brief, Patient is a54 y.o.femalewith PMHx of ESRD on PD, CAD s/p PCI, chronic diastolic heart failure, DM, HLD who presented to the ED on 2/11 with chest pain-found to have non-STEMI-underwent LHC-Per cardiology due to tortuosity and location of the vessel-not amenable for PCI-and medical management recommended. Hospital course complicated by hypotension, and fever-subsequently found to have COVID-19. See below for further details.  Brief Hospital Course: Non-STEMI-prior history of CAD s/p PCI 01/17/2019: remains chest pain-free-percardiology-due to location/tortuosity of culprit lesion-not a candidate for PCI-medical management recommended. Continue aspirin/statin/Brilinta-tolerating nitrates with stable BP-midodrine discontinued on 2/22.  Please ensure outpatient follow-up with cardiology.    Sepsis secondary to COVID-19 pneumonia and concurrent bacterial pneumonia: Sepsis physiology improving-has been transitioned to room air. Peritoneal dialysate cultures negative so far, 1/2 blood cultures on 2/17+ for Staphylococcus capitis-this patient still has a small right groin wound (recent history of right groin exploration for hematoma following LHC in January 2021). CT abdomen with decreasing size of the  hematoma/seromas. Has been on vancomycin/cefepime for the past several days-Case was discussed with Dr. Cherlynn Polo MD-after chart review-recommendations were to transition to doxycycline to complete a 10-14-day course of  antibiotics. Patient will finish up steroids/remdesivir on 2/22.   Note-although patient was Covid positive on 2/18 (initial Covid test on 2/11 on admission was negative)-Per patient her husband (at home) also tested positive on 2/18-suspect patient acquired Covid in the outpatient setting        Outpatient Medications Prior to Visit  Medication Sig Dispense Refill  . Amino Acids-Protein Hydrolys (FEEDING SUPPLEMENT, PRO-STAT SUGAR FREE 64,) LIQD Take 30 mLs by mouth 2 (two) times daily. 1800 mL 0  . aspirin EC 81 MG EC tablet Take 1 tablet (81 mg total) by mouth daily. 30 tablet 0  . calcium acetate (PHOSLO) 667 MG capsule Take 667 mg by mouth 3 (three) times daily with meals.    . cinacalcet (SENSIPAR) 90 MG tablet Take 90 mg by mouth at bedtime.    . Cyanocobalamin (B-12 PO) Take 1 tablet by mouth daily.    . ferric citrate (AURYXIA) 1 GM 210 MG(Fe) tablet Take 2 tablets (420 mg total) by mouth 3 (three) times daily with meals. 638 tablet 0  . folic acid (FOLVITE) 1 MG tablet Take 1 tablet (1 mg total) by mouth daily. 90 tablet 1  . gabapentin (NEURONTIN) 100 MG capsule Take 100 mg by mouth 2 (two) times daily.    Marland Kitchen gentamicin cream (GARAMYCIN) 0.1 % Apply 1 application topically See admin instructions. Apply to exit site daily as directed.  4  . insulin aspart (NOVOLOG FLEXPEN) 100 UNIT/ML FlexPen 0-6 Units, Subcutaneous, 3 times daily with meals, First dose on Fri 03/03/19 at 0800 Correction coverage: Very Sensitive (ESRD/Dialysis) CBG < 70: Implement Hypoglycemia measures/Call MD CBG 70 - 120: 0 units CBG 121 - 150: 0 units CBG 151 - 200: 1 unit CBG 201-250: 2 units CBG 251-300: 3 units CBG 301-350: 4 units CBG 351-400: 5 units CBG > 400: Give 6 units and call MD 15 mL 0  . Insulin Pen Needle 32G X 8 MM MISC Use as directed 100 each 0  . nitroGLYCERIN (NITROSTAT) 0.4 MG SL tablet TAKE 1 TABLET BY MOUTH UNDER THE TONGUE EVERY 5 MINUTES AS NEEDED FOR CHEST PAIN 50 tablet 2  .  pantoprazole (PROTONIX) 40 MG tablet Take 1 tablet (40 mg total) by mouth daily. 30 tablet 0  . rosuvastatin (CRESTOR) 10 MG tablet Take 1 tablet (10 mg total) by mouth daily. 90 tablet 3  . ticagrelor (BRILINTA) 90 MG TABS tablet Take 1 tablet (90 mg total) by mouth 2 (two) times daily. 180 tablet 3  . HYDROcodone-acetaminophen (NORCO) 5-325 MG tablet Take 1 tablet by mouth every 6 (six) hours as needed for moderate pain. 15 tablet 0  . isosorbide dinitrate (ISORDIL) 10 MG tablet Take 1 tablet (10 mg total) by mouth 2 (two) times daily. 120 tablet 0  . ondansetron (ZOFRAN ODT) 8 MG disintegrating tablet Take 1 tablet (8 mg total) by mouth every 8 (eight) hours as needed for nausea or vomiting. 10 tablet 0  . predniSONE (DELTASONE) 20 MG tablet Take 1 tablet (20 mg total) by mouth daily with breakfast. 5 tablet 0   No facility-administered medications prior to visit.    ROS Review of Systems  Constitutional: Positive for fatigue and unexpected weight change (wt loss). Negative for chills, diaphoresis and fever.  HENT: Negative.   Eyes: Negative for visual disturbance.  Respiratory: Positive for cough and shortness of breath. Negative for chest tightness, wheezing and stridor.   Cardiovascular: Negative for chest pain, palpitations and leg swelling.  Gastrointestinal: Negative for abdominal pain, blood in stool, constipation, diarrhea, nausea and vomiting.  Genitourinary: Negative.  Negative for difficulty urinating.  Musculoskeletal: Negative.  Negative for arthralgias and myalgias.  Skin: Negative.  Negative for color change, pallor and rash.  Neurological: Positive for dizziness, weakness and light-headedness. Negative for syncope and numbness.  Hematological: Negative for adenopathy. Does not bruise/bleed easily.  Psychiatric/Behavioral: Negative.     Objective:  BP 124/80 (BP Location: Left Arm, Patient Position: Sitting, Cuff Size: Large)   Pulse 68   Temp 98.2 F (36.8 C) (Oral)    Resp 16   Ht 5' 5"  (1.651 m)   Wt 281 lb 4 oz (127.6 kg)   SpO2 96%   BMI 46.80 kg/m   BP Readings from Last 3 Encounters:  04/04/19 124/80  03/14/19 (!) 147/86  02/17/19 (!) 148/85    Wt Readings from Last 3 Encounters:  04/04/19 281 lb 4 oz (127.6 kg)  03/14/19 287 lb 4.2 oz (130.3 kg)  02/17/19 277 lb (125.6 kg)    Physical Exam Vitals reviewed.  Constitutional:      Appearance: She is obese.  HENT:     Nose: Nose normal.     Mouth/Throat:     Mouth: Mucous membranes are pale.     Pharynx: No oropharyngeal exudate.  Eyes:     Conjunctiva/sclera: Conjunctivae normal.  Cardiovascular:     Rate and Rhythm: Normal rate and regular rhythm.     Heart sounds: No murmur.  Pulmonary:     Effort: Pulmonary effort is normal.     Breath sounds: No stridor. No wheezing, rhonchi or rales.  Abdominal:     General: Abdomen is protuberant. Bowel sounds are normal. There is no distension.     Palpations: Abdomen is soft. There is no hepatomegaly, splenomegaly or mass.     Tenderness: There is no abdominal tenderness.     Hernia: No hernia is present.  Musculoskeletal:        General: Normal range of motion.     Cervical back: Neck supple.     Right lower leg: Edema (trace pitting) present.     Left lower leg: Edema (trace pitting) present.  Lymphadenopathy:     Cervical: No cervical adenopathy.  Skin:    General: Skin is warm.     Coloration: Skin is pale.  Neurological:     General: No focal deficit present.     Mental Status: She is alert.  Psychiatric:        Mood and Affect: Mood normal.        Behavior: Behavior normal.     Lab Results  Component Value Date   WBC 8.3 04/04/2019   HGB 10.2 (L) 04/04/2019   HCT 31.6 (L) 04/04/2019   PLT 333.0 04/04/2019   GLUCOSE 193 (H) 04/04/2019   CHOL 185 01/16/2019   TRIG 150 (H) 01/16/2019   HDL 39 (L) 01/16/2019   LDLDIRECT 161.6 11/28/2012   LDLCALC 116 (H) 01/16/2019   ALT 14 03/14/2019   AST 16 03/14/2019   NA  140 04/04/2019   K 3.2 (L) 04/04/2019   CL 98 04/04/2019   CREATININE 11.91 (HH) 04/04/2019   BUN 51 (H) 04/04/2019   CO2 27 04/04/2019   TSH 3.798 04/18/2018   INR 1.0 05/27/2018   HGBA1C 6.4 04/04/2019  MICROALBUR renal 04/12/2018    DG Chest 2 View  Result Date: 03/02/2019 CLINICAL DATA:  Chest pain EXAM: CHEST - 2 VIEW COMPARISON:  02/11/2019 FINDINGS: Heart and mediastinal contours are within normal limits. No focal opacities or effusions. No acute bony abnormality. IMPRESSION: No active cardiopulmonary disease. Electronically Signed   By: Rolm Baptise M.D.   On: 03/02/2019 17:40   CARDIAC CATHETERIZATION  Result Date: 03/03/2019  Mid LAD lesion is 75% stenosed.  Ost LAD to Mid LAD lesion is 40% stenosed.  Previously placed Ost 1st Mrg drug eluting stent is widely patent.  Mid Cx to Dist Cx lesion is 55% stenosed with 40% stenosed side branch in Ost 3rd Mrg.  Previously placed Prox RCA drug eluting stent is widely patent.  Ost 1st Mrg to 1st Mrg lesion is 99% stenosed. Partially in the stent and partially distal to the stent- subacte stent thrombosis.  LV end diastolic pressure is normal.  There is no aortic valve stenosis.  Medical therapy for stent thrombosis of the OM stent.  Given the severe tortuosity, I don't think repeat PCI attempt would be beneficial.  There was significant difficulty getting a stent delivered during the last cath.  I think there would be a high likelihood of completely occluding the vessel with wire manipulation. .   DG Chest 2 View  Result Date: 04/04/2019 CLINICAL DATA:  Cough EXAM: CHEST - 2 VIEW COMPARISON:  03/09/2019, 03/12/2019 FINDINGS: The heart size and mediastinal contours are within normal limits. Atherosclerotic calcification of the aortic knob. Improved aeration of the lung bases compared to prior. No focal airspace consolidation, pleural effusion, or pneumothorax. The visualized skeletal structures are unremarkable. IMPRESSION: No active  cardiopulmonary disease. Electronically Signed   By: Davina Poke D.O.   On: 04/04/2019 16:30    Assessment & Plan:   Dashanae was seen today for cough and anemia.  Diagnoses and all orders for this visit:  Deficiency anemia- Her H&H remain low and she is symptomatic but vitamin levels are normal.  This is likely the anemia of chronic disease and renal failure.  She will continue to follow with this during hemodialysis. -     IBC panel -     Vitamin B12 -     CBC with Differential/Platelet -     Vitamin B1 -     Folate -     Ferritin -     Reticulocytes  Hypokalemia- Her potassium level remains low.  This will be managed by her nephrologist. -     Basic metabolic panel -     Magnesium  Cough- Her chest x-ray is negative for mass or infiltrate.  This is likely a post viral persistent cough.  I offered her reassurance. -     DG Chest 2 View; Future  Type 2 diabetes mellitus with stage 5 chronic kidney disease not on chronic dialysis, without long-term current use of insulin ([place])- Her blood sugars are adequately well controlled. -     Hemoglobin A1c  Chronic heart failure with preserved ejection fraction ([place])- She has a normal volume status today.  Will continue isosorbide dinitrate and her other meds. -     isosorbide dinitrate (ISORDIL) 10 MG tablet; Take 1 tablet (10 mg total) by mouth 2 (two) times daily.   I have discontinued Alexie Lanni. Goggans-Brockman's ondansetron, predniSONE, and HYDROcodone-acetaminophen. I am also having her maintain her gentamicin cream, Cyanocobalamin (B-12 PO), aspirin, folic acid, calcium acetate, gabapentin, cinacalcet, ferric citrate, ticagrelor, rosuvastatin, pantoprazole,  feeding supplement (PRO-STAT SUGAR FREE 64), NovoLOG FlexPen, Insulin Pen Needle, nitroGLYCERIN, and isosorbide dinitrate.  Meds ordered this encounter  Medications  . isosorbide dinitrate (ISORDIL) 10 MG tablet    Sig: Take 1 tablet (10 mg total) by mouth 2 (two) times daily.     Dispense:  360 tablet    Refill:  0     Follow-up: Return in about 3 weeks (around 04/25/2019).  Scarlette Calico, MD

## 2019-04-04 NOTE — Telephone Encounter (Signed)
Sheneka from Lab at [facility] called with critical Creatinine at 11.91 and GFR of 4.0.   Read results back to confirm patient, DOB and lab with critical value.   Read results to PCP and PCP confirmed receipt of result.

## 2019-04-05 ENCOUNTER — Other Ambulatory Visit: Payer: Self-pay | Admitting: Cardiovascular Disease

## 2019-04-05 ENCOUNTER — Encounter: Payer: Self-pay | Admitting: Internal Medicine

## 2019-04-05 MED ORDER — ISOSORBIDE DINITRATE 10 MG PO TABS: 10.0000 mg | ORAL_TABLET | Freq: Two times a day (BID) | ORAL | 0 refills | Status: DC

## 2019-04-05 NOTE — Telephone Encounter (Signed)
*  STAT* If patient is at the pharmacy, call can be transferred to refill team.   1. Which medications need to be refilled? (please list name of each medication and dose if known) need a new prescription for Isosorbide  2. Which pharmacy/location (including street and city if local pharmacy) is medication to be sent to? [facility] [address]- [phone]  3. Do they need a 30 day or 90 day supply?180 and refills

## 2019-04-05 NOTE — Consult Note (Signed)
Patient requested refill on isosorbide medication was refill by another doctor on 03/19/2019.

## 2019-04-06 ENCOUNTER — Encounter: Payer: Self-pay | Admitting: Internal Medicine

## 2019-04-10 ENCOUNTER — Encounter: Payer: Self-pay | Admitting: Physician Assistant

## 2019-04-10 ENCOUNTER — Other Ambulatory Visit: Payer: Self-pay

## 2019-04-10 ENCOUNTER — Encounter: Payer: Self-pay | Admitting: Internal Medicine

## 2019-04-10 ENCOUNTER — Other Ambulatory Visit: Payer: Self-pay | Admitting: Internal Medicine

## 2019-04-10 ENCOUNTER — Ambulatory Visit (INDEPENDENT_AMBULATORY_CARE_PROVIDER_SITE_OTHER): Payer: BC Managed Care – PPO | Admitting: Physician Assistant

## 2019-04-10 VITALS — BP 128/86 | HR 89 | Temp 97.2°F | Ht 65.0 in | Wt 283.4 lb

## 2019-04-10 DIAGNOSIS — R002 Palpitations: Secondary | ICD-10-CM

## 2019-04-10 DIAGNOSIS — E785 Hyperlipidemia, unspecified: Secondary | ICD-10-CM

## 2019-04-10 DIAGNOSIS — I10 Essential (primary) hypertension: Secondary | ICD-10-CM | POA: Diagnosis not present

## 2019-04-10 DIAGNOSIS — D538 Other specified nutritional anemias: Secondary | ICD-10-CM

## 2019-04-10 DIAGNOSIS — N186 End stage renal disease: Secondary | ICD-10-CM

## 2019-04-10 DIAGNOSIS — U071 COVID-19: Secondary | ICD-10-CM

## 2019-04-10 DIAGNOSIS — E119 Type 2 diabetes mellitus without complications: Secondary | ICD-10-CM

## 2019-04-10 DIAGNOSIS — E519 Thiamine deficiency, unspecified: Secondary | ICD-10-CM | POA: Insufficient documentation

## 2019-04-10 DIAGNOSIS — I2510 Atherosclerotic heart disease of native coronary artery without angina pectoris: Secondary | ICD-10-CM | POA: Diagnosis not present

## 2019-04-10 DIAGNOSIS — Z992 Dependence on renal dialysis: Secondary | ICD-10-CM

## 2019-04-10 LAB — VITAMIN B1: Vitamin B1 (Thiamine): <6 nmol/L — ABNORMAL LOW (ref 8–30)

## 2019-04-10 LAB — RETICULOCYTES
ABS Retic: 51510 cells/uL (ref 20000–8000)
Retic Ct Pct: 1.7 %

## 2019-04-10 MED ORDER — THIAMINE HCL 100 MG PO TABS: 100.0000 mg | ORAL_TABLET | ORAL | 1 refills | Status: DC

## 2019-04-10 NOTE — Progress Notes (Signed)
Cardiology Office Note:    Date:  04/10/2019   ID:  Brenda Davis, DOB 01/24/1965, MRN [id number]  PCP:  [name], MD  Cardiologist:  Skeet Latch, MD  Electrophysiologist:  None   Referring MD: [name], MD   Chief Complaint  Patient presents with  . Follow-up    seen for Dr. Oval Linsey    History of Present Illness:    Brenda Davis is a 54 y.o. female with a hx of CAD, HTN, HLD, ESRD on PD, and DM II. Patient was admitted with NSTEMI in December 2020 and underwent DES x2 to proximal RCA and the OM1.  Mid LAD had a 75% lesion which was managed medically.  Patient also have 40% mid LAD, mid left circumflex and the distal left circumflex artery.  Unfortunately when she followed up in the clinic on 02/01/2019, she was hypotensive with systolic blood pressure in the 80s.  She was only able to tolerate her metoprolol tartrate.   Patient was readmitted in February 2021 for chest pain, hs troponin was elevated.  Repeat cardiac catheterization performed on 03/03/2019 continue to reveal 75% mid LAD lesion, widely patent RCA stent, 99% OM1 lesion related to stent thrombosis.  Given the severe tortuosity of the vessel, Dr. Irish Lack did not recommend a repeat PCI attempt.  Medical therapy was recommended.  Since the patient did not tolerate Imdur in the past, she was placed on isosorbide dinitrate which she tolerated well.  She was initially placed on midodrine during this admission for hypotension related to COVID-19 pneumonia diagnosed on 2/18, this was later cut back.  She did receive a course of antibiotic for positive blood culture of staph captitis.  She was discharged to complete a 10 to 14-day course of antibiotic.  Patient presents today for cardiology office visit.  She is on 10 mg twice daily dosing of isosorbide dinitrate.  She denies any recent chest pain.  She does notice her heart pounding after physical exertion.  I suspect this is related to  deconditioning and subsequent exertional tachycardia.  I recommended she continue to increase activity level as tolerated.  As for her medication, she can continue on the current therapy.   Past Medical History:  Diagnosis Date  . Anemia   . CAD (coronary artery disease)    a. cath in 02/2018 showing moderate 3-vessel CAD with 45% mid-LADm 55% LCx and 65% RCA stenosis which was not significant by FFR  . ESRD on dialysis ([place])   . Gout   . HCAP (healthcare-associated pneumonia) 05/06/2016  . History of blood transfusion    "related to surgery"  . Hyperlipidemia   . Hypertension   . Morbid obesity ([place])   . Pain    LEFT SHOULDER PAIN - WAS SEEN AT AN URGENT CARE - GIVEN SLING FOR COMFORT AND TOLD ROM AS TOLERATED.  Marland Kitchen Palpitations 09/24/2016  . Peritonitis, dialysis-associated ([place])   . Type II diabetes mellitus ([place])    "gastric sleeve OR corrected this" (05/06/2016)    Past Surgical History:  Procedure Laterality Date  . AV FISTULA PLACEMENT Left 01/03/2015   Procedure: BRACHIAL CEPHALIC ARTERIOVENOUS  FISTULA CREATION LEFT ARM;  Surgeon: Angelia Mould, MD;  Location: [place];  Service: Vascular;  Laterality: Left;  . CARPAL TUNNEL RELEASE Right   . [place]  . CHOLECYSTECTOMY  09/14/2017  . CORONARY STENT INTERVENTION N/A 01/17/2019   Procedure: CORONARY STENT INTERVENTION;  Surgeon: Jettie Booze, MD;  Location: [place] [facility];  Service: Cardiovascular;  Laterality: N/A;  . ESOPHAGOGASTRODUODENOSCOPY N/A 09/04/2012   Procedure: ESOPHAGOGASTRODUODENOSCOPY (EGD);  Surgeon: Shann Medal, MD;  Location: [facility];  Service: General;  Laterality: N/A;  PF  . ESOPHAGOGASTRODUODENOSCOPY (EGD) WITH ESOPHAGEAL DILATION N/A 09/29/2012   Procedure: ESOPHAGOGASTRODUODENOSCOPY (EGD) WITH ESOPHAGEAL DILATION;  Surgeon: Milus Banister, MD;  Location: [facility];  Service: Endoscopy;  Laterality: N/A;  . FEMORAL ARTERY EXPLORATION Right 01/17/2019    Procedure: Exploration Right Groin with Primary Closure or Arteriotomy Site; Washout of Retroperitoneal Hematoma;  Surgeon: Waynetta Sandy, MD;  Location: [place];  Service: Vascular;  Laterality: Right;  . INSERTION OF DIALYSIS CATHETER N/A 01/03/2015   Procedure: INSERTION OF DIALYSIS CATHETER RIGHT INTERNAL JUGULAR;  Surgeon: Angelia Mould, MD;  Location: [place];  Service: Vascular;  Laterality: N/A;  . LAPAROSCOPIC GASTRIC SLEEVE RESECTION N/A 07/19/2012   Procedure: LAPAROSCOPIC SLEEVE GASTRECTOMY with EGD;  Surgeon: Madilyn Hook, DO;  Location: [facility];  Service: General;  Laterality: N/A;  laparoscopic sleeve gastrectomy with EGD  . LEFT HEART CATH AND CORONARY ANGIOGRAPHY N/A 02/25/2018   Procedure: LEFT HEART CATH AND CORONARY ANGIOGRAPHY;  Surgeon: Leonie Man, MD;  Location: [place] [facility];  Service: Cardiovascular;  Laterality: N/A;  . LEFT HEART CATH AND CORONARY ANGIOGRAPHY N/A 06/20/2018   Procedure: LEFT HEART CATH AND CORONARY ANGIOGRAPHY;  Surgeon: Nelva Bush, MD;  Location: [place] [facility];  Service: Cardiovascular;  Laterality: N/A;  . LEFT HEART CATH AND CORONARY ANGIOGRAPHY N/A 01/16/2019   Procedure: LEFT HEART CATH AND CORONARY ANGIOGRAPHY;  Surgeon: Jettie Booze, MD;  Location: [facility];  Service: Cardiovascular;  Laterality: N/A;  . LEFT HEART CATH AND CORONARY ANGIOGRAPHY N/A 03/03/2019   Procedure: LEFT HEART CATH AND CORONARY ANGIOGRAPHY;  Surgeon: Jettie Booze, MD;  Location: [place] [facility];  Service: Cardiovascular;  Laterality: N/A;  . PERIPHERAL VASCULAR CATHETERIZATION Left 05/23/2015   Procedure: Nolon Stalls;  Surgeon: Conrad Thornport, MD;  Location: [place] [facility];  Service: Cardiovascular;  Laterality: Left;  upper aRM  . PERIPHERAL VASCULAR CATHETERIZATION Left 05/23/2015   Procedure: Peripheral Vascular Balloon Angioplasty;  Surgeon: Conrad [place], MD;  Location: [place] [facility];  Service: Cardiovascular;   Laterality: Left;  av fistula  . TUBAL LIGATION  1993  . UPPER GI ENDOSCOPY N/A 07/19/2012   Procedure: UPPER GI ENDOSCOPY;  Surgeon: Madilyn Hook, DO;  [facility];  Service: General;  Laterality: N/A;    Current Medications: Current Meds  Medication Sig  . aspirin EC 81 MG EC tablet Take 1 tablet (81 mg total) by mouth daily.  . calcium acetate (PHOSLO) 667 MG capsule Take 667 mg by mouth 3 (three) times daily with meals.  . cinacalcet (SENSIPAR) 90 MG tablet Take 90 mg by mouth at bedtime.  . Cyanocobalamin (B-12 PO) Take 1 tablet by mouth daily.  . ferric citrate (AURYXIA) 1 GM 210 MG(Fe) tablet Take 2 tablets (420 mg total) by mouth 3 (three) times daily with meals.  . folic acid (FOLVITE) 1 MG tablet Take 1 tablet (1 mg total) by mouth daily.  Marland Kitchen gabapentin (NEURONTIN) 100 MG capsule Take 100 mg by mouth 2 (two) times daily.  Marland Kitchen gentamicin cream (GARAMYCIN) 0.1 % Apply 1 application topically See admin instructions. Apply to exit site daily as directed.  . Insulin Pen Needle 32G X 8 MM MISC Use as directed  . isosorbide dinitrate (ISORDIL) 10  MG tablet Take 1 tablet (10 mg total) by mouth 2 (two) times daily.  . nitroGLYCERIN (NITROSTAT) 0.4 MG SL tablet TAKE 1 TABLET BY MOUTH UNDER THE TONGUE EVERY 5 MINUTES AS NEEDED FOR CHEST PAIN  . pantoprazole (PROTONIX) 40 MG tablet Take 1 tablet (40 mg total) by mouth daily.  . rosuvastatin (CRESTOR) 10 MG tablet Take 1 tablet (10 mg total) by mouth daily.  Marland Kitchen thiamine 100 MG tablet Take 1 tablet (100 mg total) by mouth every other day.  . ticagrelor (BRILINTA) 90 MG TABS tablet Take 1 tablet (90 mg total) by mouth 2 (two) times daily.     Allergies:   Amlodipine, Atorvastatin, Clonidine derivatives, Doxycycline, and Welchol [colesevelam hcl]   Social History   Socioeconomic History  . Marital status: Married    Spouse name: Not on file  . Number of children: 3  . Years of education: Not on file  . Highest education level: Not on file   Occupational History  . Occupation: Research scientist (physical sciences): [employer]  Tobacco Use  . Smoking status: Former Smoker    Packs/day: 1.00    Years: 16.00    Pack years: 16.00    Types: Cigarettes    Quit date: 2009    Years since quitting: 12.2  . Smokeless tobacco: Never Used  Substance and Sexual Activity  . Alcohol use: Never  . Drug use: No  . Sexual activity: Yes    Birth control/protection: Post-menopausal  Other Topics Concern  . Not on file  Social History Narrative  . Not on file   Social Determinants of Health   Financial Resource Strain:   . Difficulty of Paying Living Expenses:   Food Insecurity:   . Worried About Charity fundraiser in the Last Year:   . Arboriculturist in the Last Year:   Transportation Needs:   . Film/video editor (Medical):   Marland Kitchen Lack of Transportation (Non-Medical):   Physical Activity:   . Days of Exercise per Week:   . Minutes of Exercise per Session:   Stress:   . Feeling of Stress :   Social Connections:   . Frequency of Communication with Friends and Family:   . Frequency of Social Gatherings with Friends and Family:   . Attends Religious Services:   . Active Member of Clubs or Organizations:   . Attends Archivist Meetings:   Marland Kitchen Marital Status:      Family History: The patient's family history includes Arthritis in an other family member; Diabetes in her father; Hyperlipidemia in an other family member; Hypertension in her mother; Mitral valve prolapse in her mother. There is no history of Cancer, Heart disease, Kidney disease, or Stroke.  ROS:   Please see the history of present illness.     All other systems reviewed and are negative.  EKGs/Labs/Other Studies Reviewed:    The following studies were reviewed today:  Cath 03/03/2019  Mid LAD lesion is 75% stenosed.  Ost LAD to Mid LAD lesion is 40% stenosed.  Previously placed Ost 1st Mrg drug eluting stent is widely patent.  Mid Cx to Dist Cx lesion  is 55% stenosed with 40% stenosed side branch in Ost 3rd Mrg.  Previously placed Prox RCA drug eluting stent is widely patent.  Ost 1st Mrg to 1st Mrg lesion is 99% stenosed. Partially in the stent and partially distal to the stent- subacte stent thrombosis.  LV end diastolic pressure is normal.  There is no aortic valve stenosis.   Medical therapy for stent thrombosis of the OM stent.  Given the severe tortuosity, I don't think repeat PCI attempt would be beneficial.  There was significant difficulty getting a stent delivered during the last cath.  I think there would be a high likelihood of completely occluding the vessel with wire manipulation. .   EKG:  EKG is ordered today.  The ekg ordered today demonstrates normal sinus rhythm, no significant ST-T wave changes  Recent Labs: 04/18/2018: TSH 3.798 06/18/2018: B Natriuretic Peptide 110.2 03/14/2019: ALT 14 04/04/2019: BUN 51; Creatinine, Ser 11.91; Hemoglobin 10.2; Magnesium 1.9; Platelets 333.0; Potassium 3.2; Sodium 140  Recent Lipid Panel    Component Value Date/Time   CHOL 185 01/16/2019 0449   CHOL 191 12/26/2018 1022   TRIG 150 (H) 01/16/2019 0449   HDL 39 (L) 01/16/2019 0449   HDL 44 12/26/2018 1022   CHOLHDL 4.7 01/16/2019 0449   VLDL 30 01/16/2019 0449   LDLCALC 116 (H) 01/16/2019 0449   LDLCALC 109 (H) 12/26/2018 1022   LDLDIRECT 161.6 11/28/2012 1042    Physical Exam:    VS:  BP 128/86   Pulse 89   Temp (!) 97.2 F (36.2 C)   Ht 5' 5"  (1.651 m)   Wt 283 lb 6.4 oz (128.5 kg)   SpO2 99%   BMI 47.16 kg/m     Wt Readings from Last 3 Encounters:  04/10/19 283 lb 6.4 oz (128.5 kg)  04/04/19 281 lb 4 oz (127.6 kg)  03/14/19 287 lb 4.2 oz (130.3 kg)     GEN:  Well nourished, well developed in no acute distress HEENT: Normal NECK: No JVD; No carotid bruits LYMPHATICS: No lymphadenopathy CARDIAC: RRR, no murmurs, rubs, gallops RESPIRATORY:  Clear to auscultation without rales, wheezing or rhonchi  ABDOMEN:  Soft, non-tender, non-distended MUSCULOSKELETAL:  No edema; No deformity  SKIN: Warm and dry NEUROLOGIC:  Alert and oriented x 3 PSYCHIATRIC:  Normal affect   ASSESSMENT:    1. Palpitations   2. Coronary artery disease involving native coronary artery of native heart without angina pectoris   3. Essential hypertension   4. Hyperlipidemia LDL goal <70   5. ESRD on peritoneal dialysis ([place])   6. Controlled type 2 diabetes mellitus without complication, without long-term current use of insulin ([place])   7. COVID-19    PLAN:    In order of problems listed above:  1. Palpitation: She describes the symptom as pounding sensation in the chest whenever she exerted herself.  I suspect this is related to deconditioning after multiple admissions in the COVID-19 infections recently.  I encouraged her to increase activity level as tolerated.  EKG today showed normal sinus rhythm without significant irregular rhythm  2. CAD: Recent cardiac catheterization revealed subtotally occluded OM1, PCI of the artery likely will not offer any benefit due to the tortuous nature and high likelihood of collapsed artery.  3. Hypertension: Blood pressure stable on current therapy  4. Hyperlipidemia: Continue statin  5. End-stage renal disease on peritoneal dialysis: Followed by Dr. Marval Regal  6. Recent COVID-19 pneumonia: Slowly improving.   Medication Adjustments/Labs and Tests Ordered: Current medicines are reviewed at length with the patient today.  Concerns regarding medicines are outlined above.  Orders Placed This Encounter  Procedures  . EKG 12-Lead   No orders of the defined types were placed in this encounter.   Patient Instructions  Medication Instructions:  Your physician recommends that you continue on your  current medications as directed. Please refer to the Current Medication list given to you today.  *If you need a refill on your cardiac medications before your next appointment, please  call your pharmacy*  Lab Work: NONE ordered at this time of appointment   If you have labs (blood work) drawn today and your tests are completely normal, you will receive your results only by: Marland Kitchen MyChart Message (if you have MyChart) OR . A paper copy in the mail If you have any lab test that is abnormal or we need to change your treatment, we will call you to review the results.  Testing/Procedures: NONE ordered at this time of appointment   Follow-Up: At [facility], you and your health needs are our priority.  As part of our continuing mission to provide you with exceptional heart care, we have created designated Provider Care Teams.  These Care Teams include your primary Cardiologist (physician) and Advanced Practice Providers (APPs -  Physician Assistants and Nurse Practitioners) who all work together to provide you with the care you need, when you need it.  We recommend signing up for the patient portal called "MyChart".  Sign up information is provided on this After Visit Summary.  MyChart is used to connect with patients for Virtual Visits (Telemedicine).  Patients are able to view lab/test results, encounter notes, upcoming appointments, etc.  Non-urgent messages can be sent to your provider as well.   To learn more about what you can do with MyChart, go to [email or web address].    Your next appointment:   As scheduled -05/02/19 at 10AM  The format for your next appointment:   In Person  Provider:   Skeet Latch, MD  Other Instructions      Signed, Almyra Deforest, [place]  04/10/2019 2:28 PM    [place]

## 2019-04-10 NOTE — Patient Instructions (Addendum)
Medication Instructions:  Your physician recommends that you continue on your current medications as directed. Please refer to the Current Medication list given to you today.  *If you need a refill on your cardiac medications before your next appointment, please call your pharmacy*  Lab Work: NONE ordered at this time of appointment   If you have labs (blood work) drawn today and your tests are completely normal, you will receive your results only by: Marland Kitchen MyChart Message (if you have MyChart) OR . A paper copy in the mail If you have any lab test that is abnormal or we need to change your treatment, we will call you to review the results.  Testing/Procedures: NONE ordered at this time of appointment   Follow-Up: At [facility], you and your health needs are our priority.  As part of our continuing mission to provide you with exceptional heart care, we have created designated Provider Care Teams.  These Care Teams include your primary Cardiologist (physician) and Advanced Practice Providers (APPs -  Physician Assistants and Nurse Practitioners) who all work together to provide you with the care you need, when you need it.  We recommend signing up for the patient portal called "MyChart".  Sign up information is provided on this After Visit Summary.  MyChart is used to connect with patients for Virtual Visits (Telemedicine).  Patients are able to view lab/test results, encounter notes, upcoming appointments, etc.  Non-urgent messages can be sent to your provider as well.   To learn more about what you can do with MyChart, go to [email or web address].    Your next appointment:   As scheduled -05/02/19 at 10AM  The format for your next appointment:   In Person  Provider:   Skeet Latch, MD  Other Instructions

## 2019-04-25 ENCOUNTER — Ambulatory Visit (INDEPENDENT_AMBULATORY_CARE_PROVIDER_SITE_OTHER): Payer: BC Managed Care – PPO

## 2019-04-25 ENCOUNTER — Ambulatory Visit (INDEPENDENT_AMBULATORY_CARE_PROVIDER_SITE_OTHER): Payer: BC Managed Care – PPO | Admitting: Family Medicine

## 2019-04-25 ENCOUNTER — Other Ambulatory Visit: Payer: Self-pay

## 2019-04-25 ENCOUNTER — Encounter: Payer: Self-pay | Admitting: Family Medicine

## 2019-04-25 VITALS — BP 120/82 | HR 87 | Ht 65.0 in | Wt 276.0 lb

## 2019-04-25 DIAGNOSIS — M6530 Trigger finger, unspecified finger: Secondary | ICD-10-CM

## 2019-04-25 DIAGNOSIS — M67449 Ganglion, unspecified hand: Secondary | ICD-10-CM | POA: Diagnosis not present

## 2019-04-25 NOTE — Patient Instructions (Signed)
Thank you for coming in today. Call or go to the ER if you develop a large red swollen joint with extreme pain or oozing puss.  Use the double Band-Aid splint on the thumb for a few weeks.  Ok to use that same type of splint on other fingers if the trigger finger starts up.  Use voltaren gel over the counter on the fingers as needed.  Recheck with me or Dr Tamala Julian as needed for this issue or the ganglion cyst on the right thumb.

## 2019-04-25 NOTE — Progress Notes (Signed)
Rito Ehrlich, am serving as a Education administrator for Dr. Lynne Leader.  Brenda Davis is a 54 y.o. female who presents to [place] at [facility] today for f/u of trigger finger.  She was last seen by Dr. Tamala Julian on 09/12/18 for a L middle finger trigger finger and had an injection.  Since her last visit, she reports L thumb has been locking up and popping for about a month. States sometimes has to manually unlock it. Patient has had issues with trigger fingers in the past new to the thumb.  She also has a small bump on the dorsal aspect of her right thumb that is not very tender.  Is been there for a few months.   Pertinent review of systems: No fevers or chills  Relevant historical information: Kidney failure with peritoneal dialysis.  Heart disease.  History multiple trigger finger in the past.  Current diabetes   Exam:  BP 120/82 (BP Location: Left Arm, Patient Position: Sitting, Cuff Size: Normal)   Pulse 87   Ht 5' 5"  (1.651 m)   Wt 276 lb (125.2 kg)   SpO2 99%   BMI 45.93 kg/m  General: Well Developed, well nourished, and in no acute distress.   MSK:  Left thumb normal-appearing tender palpation palmar aspect MCP at A1 pulley.  Triggering present with flexion of interphalangeal joint.  Hand nontender normal-appearing otherwise.  Pulses cap refill and sensation are intact.  Right thumb: Small firm nodule dorsal aspect of thumb at distal phalanx proximal to nailbed.  Nontender.  Normal thumb motion.    Lab and Radiology Results  Brief look with ultrasound probe right thumb shows fluid-filled cyst consistent with ganglion cyst.  Procedure: Real-time Ultrasound Guided Injection of left thumb A1 pulley trigger finger Device: Philips Affiniti 50G Images permanently stored and available for review in the ultrasound unit. Verbal informed consent obtained.  Discussed risks and benefits of procedure. Warned about infection bleeding damage to structures skin  hypopigmentation and fat atrophy among others. Patient expresses understanding and agreement Time-out conducted.   Noted no overlying erythema, induration, or other signs of local infection.   Skin prepped in a sterile fashion.   Local anesthesia: Topical Ethyl chloride.   With sterile technique and under real time ultrasound guidance:  40 mg of Kenalog and 1 mL of lidocaine injected easily.   Completed without difficulty   Pain immediately resolved suggesting accurate placement of the medication.   Advised to call if fevers/chills, erythema, induration, drainage, or persistent bleeding.   Images permanently stored and available for review in the ultrasound unit.  Impression: Technically successful ultrasound guided injection.         Assessment and Plan: 54 y.o. female with left thumb trigger thumb.  Ongoing for about a month failing initial conservative management.  Plan for injection today along with treatment with double Band-Aid splint.  Recheck back as needed for this issue in the future or subsequent issue similar.  Additionally patient has a small ganglion cyst at the dorsal aspect of her right thumb.  Advised that if it does not bother her it is absolutely fine to leave alone however if she would like to have it treated she can return to clinic for aspiration and injection.   PDMP not reviewed this encounter. Orders Placed This Encounter  Procedures  . [place] LIMITED JOINT SPACE STRUCTURES UP LEFT    Standing Status:   Future    Number of Occurrences:   1  Standing Expiration Date:   06/24/2020    Order Specific Question:   Reason for Exam (SYMPTOM  OR DIAGNOSIS REQUIRED)    Answer:   Left thumb pain    Order Specific Question:   Preferred imaging location?    Answer:   [place]   No orders of the defined types were placed in this encounter.    Discussed warning signs or symptoms. Please see discharge instructions. Patient expresses  understanding.   The above documentation has been reviewed and is accurate and complete Lynne Leader

## 2019-05-02 ENCOUNTER — Ambulatory Visit (INDEPENDENT_AMBULATORY_CARE_PROVIDER_SITE_OTHER): Payer: BC Managed Care – PPO | Admitting: Cardiovascular Disease

## 2019-05-02 ENCOUNTER — Other Ambulatory Visit: Payer: Self-pay

## 2019-05-02 ENCOUNTER — Encounter: Payer: Self-pay | Admitting: Cardiovascular Disease

## 2019-05-02 VITALS — BP 110/84 | HR 87 | Ht 65.0 in | Wt 278.4 lb

## 2019-05-02 DIAGNOSIS — I200 Unstable angina: Secondary | ICD-10-CM | POA: Diagnosis not present

## 2019-05-02 DIAGNOSIS — N186 End stage renal disease: Secondary | ICD-10-CM

## 2019-05-02 DIAGNOSIS — E785 Hyperlipidemia, unspecified: Secondary | ICD-10-CM | POA: Diagnosis not present

## 2019-05-02 DIAGNOSIS — I25110 Atherosclerotic heart disease of native coronary artery with unstable angina pectoris: Secondary | ICD-10-CM

## 2019-05-02 DIAGNOSIS — Z6841 Body Mass Index (BMI) 40.0 and over, adult: Secondary | ICD-10-CM

## 2019-05-02 DIAGNOSIS — Z992 Dependence on renal dialysis: Secondary | ICD-10-CM

## 2019-05-02 MED ORDER — ROSUVASTATIN CALCIUM 20 MG PO TABS: 20.0000 mg | ORAL_TABLET | Freq: Every day | ORAL | 3 refills | Status: DC

## 2019-05-02 MED ORDER — MIDODRINE HCL 5 MG PO TABS: 5.0000 mg | ORAL_TABLET | Freq: Three times a day (TID) | ORAL | 3 refills | Status: DC

## 2019-05-02 NOTE — Progress Notes (Signed)
Cardiology Office Note   Date:  05/02/2019   ID:  EMUNAH TEXIDOR, DOB 10/15/1965, MRN [id number]  PCP:  [name], MD  Cardiologist:  Skeet Latch, MD  Electrophysiologist:  None   Evaluation Performed:  Follow-Up Visit  Chief Complaint:  CAD   History of Present Illness:    Brenda Davis is a 54 y.o. female with moderate CAD, diabetes, hypertension, hyperlipidemia, ESRD on PD here for follow up.  She was initially seen in 2018 for an evaluation of palpitations.  She saw Dr. Marval Regal and reported palpitations. She was referred to cardiology for further evaluation.  Whenever she is on dialysis she has palpitations during the last two hours.  It feels like her heart is fluttering and she feels like she is going to pass out.  It gets progressively worse during that time period and she gets short of breath.  Once she stops HD the symptoms subside.  Her BP is typically in the 741U systolic.  It drops to the 100s while on hemodialysis.   She wore a 48-hour Holter that showed no arrhythmias and occasional PACs.  She had an echo 01/04/15 revealed LVEF 65-70% with grade 2 diastolic dysfunction and mild mitral regurgitation.  She presented to the hospital 06/2018 with a NSTEMI. She underwent left heart cath and was found to have moderate disease.  FFR of the RCA was within normal limits.  Medical therapy was recommended.  She was started on Imdur and atorvastatin.  However she did not tolerate nitrates.  She followed up with Jory Sims, DNP and was started on gabapentin for concern for neuropathic pain.  Since that time she has been feeling much better.  She has needed nitro at approximately twice per month.  She only takes 1 tablet when it occurs.  Her chest pain always occurs at rest.  She has not been getting much exercise lately.  She notes that she has gained 30 pounds.  She plans to start walking her lunch break and working on her diet.  She has been trying to  limit fried foods but craves them.  She checks her blood pressure twice daily.  It is typically low in the morning when she finishes her peritoneal dialysis.  In the afternoon it is higher, but it stays below the 384T systolic.  Her nephrologist Dr. Atorvastatin in September due to elevated LFTs.  She reports that they normalized on repeat lab evaluation.  Since her last appointment [name] was admitted 12/2018 with NSTEMI.  She underwent DES to the proximal RCA and OM1.  She also had a 75% LAD lesion that was medically managed.  That hospitalization was complicated by a R femoral groin hematoma that required exploration and then developed an ulcer.  She presented ot the ED 02/2019 with unstable angina.  She was found to have stent thrombosis of the OM stent.  It was not amenable to PCI and medical management was recommended.  During that hospitalization she developed Covid pneumonia and sepsis.  She tested negative for Covid on admission but subsequently tested positive during the hospitalization.  Her husband also tested positive and had been at home, therefore this is not thought to be hospital-acquired.  She was treated with remdesivir and steroids.  Blood cultres were positive for Staph capitis.  She was treated with vancomycin/cefepime and then transitioned to doxycycline.   Since going home she has been feeling well.  She has started walking up and down her driveway and in  her back yard.  She is noticing improvement in her stamina and hopes to be able to walk to the end of her street.  She hasn't had any chest pain.  Her breathing is improving.  Her R groin has finally healed.  She has not had any episodes of chest pain or pressure.  She notes that her weight fluctuates quite a bit.  She continues to feel very tired and wonders if this is related to her prior Covid infection.  Her dialysis nurse noted that the pulses in her right leg are not as strong as the ones in her right.  She instructed  her to follow-up with vein and vascular.  Sometimes her right leg feels like it gets numb.  She has difficult time lifting her legs and feels like she is always walking around with weights on them.   Past Medical History:  Diagnosis Date  . Anemia   . CAD (coronary artery disease)    a. cath in 02/2018 showing moderate 3-vessel CAD with 45% mid-LADm 55% LCx and 65% RCA stenosis which was not significant by FFR  . ESRD on dialysis ([place])   . Gout   . HCAP (healthcare-associated pneumonia) 05/06/2016  . History of blood transfusion    "related to surgery"  . Hyperlipidemia   . Hypertension   . Morbid obesity ([place])   . Pain    LEFT SHOULDER PAIN - WAS SEEN AT AN URGENT CARE - GIVEN SLING FOR COMFORT AND TOLD ROM AS TOLERATED.  Marland Kitchen Palpitations 09/24/2016  . Peritonitis, dialysis-associated ([place])   . Type II diabetes mellitus ([place])    "gastric sleeve OR corrected this" (05/06/2016)   Past Surgical History:  Procedure Laterality Date  . AV FISTULA PLACEMENT Left 01/03/2015   Procedure: BRACHIAL CEPHALIC ARTERIOVENOUS  FISTULA CREATION LEFT ARM;  Surgeon: Angelia Mould, MD;  Location: [facility];  Service: Vascular;  Laterality: Left;  . CARPAL TUNNEL RELEASE Right   . [place]  . CHOLECYSTECTOMY  09/14/2017  . CORONARY STENT INTERVENTION N/A 01/17/2019   Procedure: CORONARY STENT INTERVENTION;  Surgeon: Jettie Booze, MD;  Location: [facility];  Service: Cardiovascular;  Laterality: N/A;  . ESOPHAGOGASTRODUODENOSCOPY N/A 09/04/2012   Procedure: ESOPHAGOGASTRODUODENOSCOPY (EGD);  Surgeon: Shann Medal, MD;  Location: [facility];  Service: General;  Laterality: N/A;  PF  . ESOPHAGOGASTRODUODENOSCOPY (EGD) WITH ESOPHAGEAL DILATION N/A 09/29/2012   Procedure: ESOPHAGOGASTRODUODENOSCOPY (EGD) WITH ESOPHAGEAL DILATION;  Surgeon: Milus Banister, MD;  Location: [facility];  Service: Endoscopy;  Laterality: N/A;  . FEMORAL ARTERY EXPLORATION Right 01/17/2019    Procedure: Exploration Right Groin with Primary Closure or Arteriotomy Site; [place] of Retroperitoneal Hematoma;  Surgeon: Waynetta Sandy, MD;  Location: [facility];  Service: Vascular;  Laterality: Right;  . INSERTION OF DIALYSIS CATHETER N/A 01/03/2015   Procedure: INSERTION OF DIALYSIS CATHETER RIGHT INTERNAL JUGULAR;  Surgeon: Angelia Mould, MD;  Location: [facility];  Service: Vascular;  Laterality: N/A;  . LAPAROSCOPIC GASTRIC SLEEVE RESECTION N/A 07/19/2012   Procedure: LAPAROSCOPIC SLEEVE GASTRECTOMY with EGD;  Surgeon: Madilyn Hook, DO;  Location: [facility];  Service: General;  Laterality: N/A;  laparoscopic sleeve gastrectomy with EGD  . LEFT HEART CATH AND CORONARY ANGIOGRAPHY N/A 02/25/2018   Procedure: LEFT HEART CATH AND CORONARY ANGIOGRAPHY;  Surgeon: Leonie Man, MD;  Location: [facility];  Service: Cardiovascular;  Laterality: N/A;  . LEFT HEART CATH AND CORONARY ANGIOGRAPHY N/A 06/20/2018   Procedure:  LEFT HEART CATH AND CORONARY ANGIOGRAPHY;  Surgeon: Nelva Bush, MD;  Location: [facility];  Service: Cardiovascular;  Laterality: N/A;  . LEFT HEART CATH AND CORONARY ANGIOGRAPHY N/A 01/16/2019   Procedure: LEFT HEART CATH AND CORONARY ANGIOGRAPHY;  Surgeon: Jettie Booze, MD;  Location: [facility];  Service: Cardiovascular;  Laterality: N/A;  . LEFT HEART CATH AND CORONARY ANGIOGRAPHY N/A 03/03/2019   Procedure: LEFT HEART CATH AND CORONARY ANGIOGRAPHY;  Surgeon: Jettie Booze, MD;  Location: [facility];  Service: Cardiovascular;  Laterality: N/A;  . PERIPHERAL VASCULAR CATHETERIZATION Left 05/23/2015   Procedure: Nolon Stalls;  Surgeon: Conrad Youngwood, MD;  Location: [facility];  Service: Cardiovascular;  Laterality: Left;  upper aRM  . PERIPHERAL VASCULAR CATHETERIZATION Left 05/23/2015   Procedure: Peripheral Vascular Balloon Angioplasty;  Surgeon: Conrad Worland, MD;  Location: [facility];  Service:  Cardiovascular;  Laterality: Left;  av fistula  . TUBAL LIGATION  1993  . UPPER GI ENDOSCOPY N/A 07/19/2012   Procedure: UPPER GI ENDOSCOPY;  Surgeon: Madilyn Hook, DO;  Location: [facility];  Service: General;  Laterality: N/A;     Current Meds  Medication Sig  . aspirin EC 81 MG EC tablet Take 1 tablet (81 mg total) by mouth daily.  . calcitRIOL (ROCALTROL) 0.5 MCG capsule Take 0.5 mcg by mouth daily.  . calcium acetate (PHOSLO) 667 MG capsule Take 667 mg by mouth 3 (three) times daily with meals.  . cinacalcet (SENSIPAR) 90 MG tablet Take 90 mg by mouth at bedtime.  . Cyanocobalamin (B-12 PO) Take 1 tablet by mouth daily.  . ferric citrate (AURYXIA) 1 GM 210 MG(Fe) tablet Take 2 tablets (420 mg total) by mouth 3 (three) times daily with meals.  . folic acid (FOLVITE) 1 MG tablet Take 1 tablet (1 mg total) by mouth daily.  Marland Kitchen gabapentin (NEURONTIN) 100 MG capsule Take 100 mg by mouth 2 (two) times daily.  Marland Kitchen gentamicin cream (GARAMYCIN) 0.1 % Apply 1 application topically See admin instructions. Apply to exit site daily as directed.  . insulin aspart (NOVOLOG FLEXPEN) 100 UNIT/ML FlexPen 0-6 Units, Subcutaneous, 3 times daily with meals, First dose on Fri 03/03/19 at 0800 Correction coverage: Very Sensitive (ESRD/Dialysis) CBG < 70: Implement Hypoglycemia measures/Call MD CBG 70 - 120: 0 units CBG 121 - 150: 0 units CBG 151 - 200: 1 unit CBG 201-250: 2 units CBG 251-300: 3 units CBG 301-350: 4 units CBG 351-400: 5 units CBG > 400: Give 6 units and call MD  . Insulin Pen Needle 32G X 8 MM MISC Use as directed  . isosorbide dinitrate (ISORDIL) 10 MG tablet Take 1 tablet (10 mg total) by mouth 2 (two) times daily.  . Methoxy PEG-Epoetin Beta (MIRCERA IJ) Inject into the skin.  Marland Kitchen nitroGLYCERIN (NITROSTAT) 0.4 MG SL tablet TAKE 1 TABLET BY MOUTH UNDER THE TONGUE EVERY 5 MINUTES AS NEEDED FOR CHEST PAIN  . pantoprazole (PROTONIX) 40 MG tablet Take 1 tablet (40 mg total) by mouth daily.  . rosuvastatin  (CRESTOR) 20 MG tablet Take 1 tablet (20 mg total) by mouth daily.  Marland Kitchen thiamine 100 MG tablet Take 1 tablet (100 mg total) by mouth every other day.  . ticagrelor (BRILINTA) 90 MG TABS tablet Take 1 tablet (90 mg total) by mouth 2 (two) times daily.  . [DISCONTINUED] rosuvastatin (CRESTOR) 10 MG tablet Take 1 tablet (10 mg total) by mouth daily.     Allergies:  Amlodipine, Atorvastatin, Clonidine derivatives, Doxycycline, and Welchol [colesevelam hcl]   Social History   Tobacco Use  . Smoking status: Former Smoker    Packs/day: 1.00    Years: 16.00    Pack years: 16.00    Types: Cigarettes    Quit date: 2009    Years since quitting: 12.2  . Smokeless tobacco: Never Used  Substance Use Topics  . Alcohol use: Never  . Drug use: No     Family Hx: The patient's family history includes Arthritis in an other family member; Diabetes in her father; Hyperlipidemia in an other family member; Hypertension in her mother; Mitral valve prolapse in her mother. There is no history of Cancer, Heart disease, Kidney disease, or Stroke.  ROS:   Please see the history of present illness.     All other systems reviewed and are negative.   Prior CV studies:   The following studies were reviewed today:  Echo 01/04/2015: Study Conclusions  - Left ventricle: The cavity size was normal. Wall thickness was   normal. Systolic function was vigorous. The estimated ejection   fraction was in the range of 65% to 70%. Features are consistent   with a pseudonormal left ventricular filling pattern, with   concomitant abnormal relaxation and increased filling pressure   (grade 2 diastolic dysfunction). - Mitral valve: There was mild regurgitation. - Pericardium, extracardiac: A trivial pericardial effusion was   identified.   48 Hour Holter Monitor 10/08/2016:  Quality: Fair.  Baseline artifact. Predominant rhythm: sinus rhythm Average heart rate: 64 bpm Max heart rate: 94 bpm Min heart rate: 50  bpm  Occasional PACs were noted that did not correspond with the fluttering reported by the patient.  LHC 06/20/18: Conclusions: 1. Moderately severe multivessel coronary artery disease not significantly changed since prior catheterization in 02/2018.  DFR of the most severe lesion in the RCA at that time was not hemodynamically significant. 2. Normal left ventricular filling pressure. 3. Hyperdynamic left ventricular contraction.  Recommendations: 1. Medical therapy for moderate diffuse coronary artery disease.  There is no clear culprit for PCI.  I suspect troponin elevation reflects supply-demand mismatch.  I will start isosorbide mononitrate 30 mg daily, which can be escalated as blood pressure tolerates for relief of chest pain. 2. Aggressive secondary prevention, including high-intensity statin therapy. 3. If no evidence of bleeding complications and no recurrence of chest pain, discharge this afternoon or tomorrow could be considered from a cardiac standpoint.   Cardiac catheterization 01/17/2019  Mid LAD lesion is 75% stenosed. The LAD was not addressed and will be managed medically.  Ost LAD to Mid LAD lesion is 40% stenosed.  Mid Cx to Dist Cx lesion is 55% stenosed with 40% stenosed side branch in Ost 3rd Mrg.  Prox RCA lesion is 75% stenosed.  A drug-eluting stent was successfully placed using a SYNERGY XD 3.0X38, postdilated to 3.5 mm.  Post intervention, there is a 0% residual stenosis.  Ost 1st Mrg lesion is 99% stenosed. EBU4 Guide catheter used.  A drug-eluting stent was successfully placed using a SYNERGY XD 2.25X24, postdilated to 2.75 mm.  Post intervention, there is a 0% residual stenosis.  Continue DAPT for 12 months. Would recommend clopidogrel long-term after one year since she has such diffuse disease. Continue aggressive secondary prevention.  Cardiac Cath 03/03/19  Mid LAD lesion is 75% stenosed.  Ost LAD to Mid LAD lesion is 40%  stenosed.  Previously placed Ost 1st Mrg drug eluting stent is widely patent.  Mid Cx to Dist Cx lesion is 55% stenosed with 40% stenosed side branch in Ost 3rd Mrg.  Previously placed Prox RCA drug eluting stent is widely patent.  Ost 1st Mrg to 1st Mrg lesion is 99% stenosed. Partially in the stent and partially distal to the stent- subacte stent thrombosis.  LV end diastolic pressure is normal.  There is no aortic valve stenosis. Medical therapy for stent thrombosis of the OM stent. Given the severe tortuosity, I don't think repeat PCI attempt would be beneficial. There was significant difficulty getting a stent delivered during the last cath. I think there would be a high likelihood of completely occluding the vessel with wire manipulation.  Echocardiogram 01/16/2019 IMPRESSIONS 1. Left ventricular ejection fraction, by visual estimation, is 65 to 70%. The left ventricle has hyperdynamic function. There is moderately increased left ventricular hypertrophy. 2. Definity contrast agent was given IV to delineate the left ventricular endocardial borders. 3. Left ventricular diastolic parameters are consistent with Grade I diastolic dysfunction (impaired relaxation). 4. The left ventricle has no regional wall motion abnormalities. 5. Global right ventricle has normal systolic function.The right ventricular size is mildly enlarged. No increase in right ventricular wall thickness. 6. Left atrial size was normal. 7. Right atrial size was normal. 8. The pericardial effusion is posterior to the left ventricle. 9. Trivial pericardial effusion is present. 10. The mitral valve was not well visualized. Trivial mitral valve regurgitation. 11. The tricuspid valve is not well visualized. 12. The aortic valve is tricuspid. Aortic valve regurgitation is not visualized. Mild aortic valve sclerosis without stenosis. 13. The pulmonic valve was grossly normal. Pulmonic valve regurgitation is  not visualized. 14. The inferior vena cava is normal in size with <50% respiratory variability, suggesting right atrial pressure of 8 mmHg. 15. The interatrial septum was not well visualized.  Labs/Other Tests and Data Reviewed:    EKG:  An ECG dated 05/02/19 was personally reviewed today and demonstrated:  sinus rhythm.  Rate 87 bpm.  Non-specific t wave abnormalities  Recent Labs: 06/18/2018: B Natriuretic Peptide 110.2 03/14/2019: ALT 14 04/04/2019: BUN 51; Creatinine, Ser 11.91; Hemoglobin 10.2; Magnesium 1.9; Platelets 333.0; Potassium 3.2; Sodium 140   Recent Lipid Panel Lab Results  Component Value Date/Time   CHOL 185 01/16/2019 04:49 AM   CHOL 191 12/26/2018 10:22 AM   TRIG 150 (H) 01/16/2019 04:49 AM   HDL 39 (L) 01/16/2019 04:49 AM   HDL 44 12/26/2018 10:22 AM   CHOLHDL 4.7 01/16/2019 04:49 AM   LDLCALC 116 (H) 01/16/2019 04:49 AM   LDLCALC 109 (H) 12/26/2018 10:22 AM   LDLDIRECT 161.6 11/28/2012 10:42 AM    Wt Readings from Last 3 Encounters:  05/02/19 278 lb 6.4 oz (126.3 kg)  04/25/19 276 lb (125.2 kg)  04/10/19 283 lb 6.4 oz (128.5 kg)    04/25/19: hgb 11.4,  Total cholesterol 243, triglycerided 152, hdl 45, LDL 168 rosuva 20 mg.  Found the bottle  Objective:    VS:  BP 110/84   Pulse 87   Ht 5' 5"  (1.651 m)   Wt 278 lb 6.4 oz (126.3 kg)   SpO2 99%   BMI 46.33 kg/m  , BMI Body mass index is 46.33 kg/m. GENERAL:  Well appearing HEENT: Pupils equal round and reactive, fundi not visualized, oral mucosa unremarkable NECK:  No jugular venous distention, waveform within normal limits, carotid upstroke brisk and symmetric, no bruits, no thyromegaly LYMPHATICS:  No cervical adenopathy LUNGS:  Clear to auscultation bilaterally HEART:  RRR.  PMI not displaced or sustained,S1 and S2 within normal limits, no S3, no S4, no clicks, no rubs, no murmurs ABD:  Flat, positive bowel sounds normal in frequency in pitch, no bruits, no rebound, no guarding, no midline  pulsatile mass, no hepatomegaly, no splenomegaly EXT:  1+ L DP/PT.  2+ R DP/PT. No edema, no cyanosis no clubbing.  L UE fistula SKIN:  No rashes no nodules NEURO:  Cranial nerves II through XII grossly intact, motor grossly intact throughout PSYCH:  Cognitively intact, oriented to person place and time  ASSESSMENT & PLAN:    # CAD: # Hyperlipidemia:  She underwent had an NSTEMI 12/2018 and was found to have multivessel disease.  She was not felt to be a good surgical candidate and underwent PCI of the RCA and OM1.  There was residual 75% mid LAD disease, 55% mid left circumflex and otherwise mild to moderate disease.  She developed in-stent thrombosis of the OM that was medically managed.  She is currently doing well and has no angina.  Continue aspirin, Imdur, rosuvastatin, and ticagrelor.  She needs dual antiplatelet therapy at least through 02/2020.  Her LDL is not at goal.  Increase rosuvastatin to 67m.  Repeat lipids and CMP in 3 months.  She is not on beta-blocker due to hypotension. #Leg pain: Ms. GErling Cruzis having heavy legs and pain in her right leg especially when walking.  Her right pulses are diminished.  She had a groin hematoma that required exploration in the hospital.  Her groin wound has healed.  She is going to contact her vein and vascular specialist for further evaluation.  # ESRD: On PD and doing well.  # Hypotension: She is having symptomatic hypotension.  Increase midodrine to 57mtid.  # Morbid obesity:  She reports a 30lb weight gain.  She was encouraged to make 1 dietary change and increase exercise to 150 minutes weekly.    Medication Adjustments/Labs and Tests Ordered: Current medicines are reviewed at length with the patient today.  Concerns regarding medicines are outlined above.   Tests Ordered: Orders Placed This Encounter  Procedures  . EKG 12-Lead    Medication Changes: Meds ordered this encounter  Medications  . midodrine (PROAMATINE) 5  MG tablet    Sig: Take 1 tablet (5 mg total) by mouth 3 (three) times daily with meals.    Dispense:  90 tablet    Refill:  3  . rosuvastatin (CRESTOR) 20 MG tablet    Sig: Take 1 tablet (20 mg total) by mouth daily.    Dispense:  90 tablet    Refill:  3    New dose, d/c previous rx    Follow Up:  Either In Person or Virtual in 6 month(s)  Signed, TiSkeet LatchMD  05/02/2019 7:10 PM    [place]

## 2019-05-02 NOTE — Patient Instructions (Signed)
Medication Instructions:  INCREASE YOUR MIDODRINE TO 5 MG THREE TIMES A DAY   INCREASE YOUR ROSUVASTATIN TO 20 MG DAILY  *If you need a refill on your cardiac medications before your next appointment, please call your pharmacy*  Lab Work: NONE   Testing/Procedures: NONE  Follow-Up: At [facility], you and your health needs are our priority.  As part of our continuing mission to provide you with exceptional heart care, we have created designated Provider Care Teams.  These Care Teams include your primary Cardiologist (physician) and Advanced Practice Providers (APPs -  Physician Assistants and Nurse Practitioners) who all work together to provide you with the care you need, when you need it.  We recommend signing up for the patient portal called "MyChart".  Sign up information is provided on this After Visit Summary.  MyChart is used to connect with patients for Virtual Visits (Telemedicine).  Patients are able to view lab/test results, encounter notes, upcoming appointments, etc.  Non-urgent messages can be sent to your provider as well.   To learn more about what you can do with MyChart, go to [email or web address].    Your next appointment:   2-3 MONTHS   The format for your next appointment:   In Person  Provider:   You may see Skeet Latch, MD or one of the following Advanced Practice Providers on your designated Care Team:    Kerin Ransom, PA-C  [place], [place]  Coletta Memos, [place]

## 2019-06-01 ENCOUNTER — Other Ambulatory Visit
Admission: RE | Admit: 2019-06-01 | Discharge: 2019-06-01 | Disposition: A | Discharge status: Home / Self Care | Payer: BC Managed Care – PPO | Source: Ambulatory Visit | Attending: Nephrology | Admitting: Nephrology

## 2019-06-01 DIAGNOSIS — K650 Generalized (acute) peritonitis: Secondary | ICD-10-CM | POA: Insufficient documentation

## 2019-06-01 LAB — VANCOMYCIN, TROUGH: Vancomycin Tr: 11 ug/mL — ABNORMAL LOW (ref 15–20)

## 2019-06-14 ENCOUNTER — Telehealth: Payer: Self-pay

## 2019-06-14 DIAGNOSIS — Z1211 Encounter for screening for malignant neoplasm of colon: Secondary | ICD-10-CM

## 2019-06-14 NOTE — Telephone Encounter (Signed)
Pt contacted and she stated that she does not need a referral for a mammo but she did ask for a referral for a colonoscopy. GI referral has been entered. Pt aware that GI office will call her directly to schedule.

## 2019-06-14 NOTE — Telephone Encounter (Signed)
New message    The patient called asking for a    Referral: Breast exam   Facility: open to suggestion

## 2019-06-19 ENCOUNTER — Telehealth: Payer: BC Managed Care – PPO | Admitting: Physician Assistant

## 2019-06-19 DIAGNOSIS — R399 Unspecified symptoms and signs involving the genitourinary system: Secondary | ICD-10-CM | POA: Diagnosis not present

## 2019-06-19 MED ORDER — NITROFURANTOIN MONOHYD MACRO 100 MG PO CAPS: 100.0000 mg | ORAL_CAPSULE | Freq: Two times a day (BID) | ORAL | 0 refills | Status: AC

## 2019-06-19 NOTE — Progress Notes (Signed)
We are sorry that you are not feeling well.  Here is how we plan to help!  Based on what you shared with me it looks like you most likely have a simple urinary tract infection.  A UTI (Urinary Tract Infection) is a bacterial infection of the bladder.  Most cases of urinary tract infections are simple to treat but a key part of your care is to encourage you to drink plenty of fluids and watch your symptoms carefully.  I have prescribed MacroBid 100 mg twice a day for 5 days.  Your symptoms should gradually improve. Call us if the burning in your urine worsens, you develop worsening fever, back pain or pelvic pain or if your symptoms do not resolve after completing the antibiotic.  Urinary tract infections can be prevented by drinking plenty of water to keep your body hydrated.  Also be sure when you wipe, wipe from front to back and don't hold it in!  If possible, empty your bladder every 4 hours.  Your e-visit answers were reviewed by a board certified advanced clinical practitioner to complete your personal care plan.  Depending on the condition, your plan could have included both over the counter or prescription medications.  If there is a problem please reply  once you have received a response from your provider.  Your safety is important to [place].  If you have drug allergies check your prescription carefully.    You can use MyChart to ask questions about today's visit, request a non-urgent call back, or ask for a work or school excuse for 24 hours related to this e-Visit. If it has been greater than 24 hours you will need to follow up with your provider, or enter a new e-Visit to address those concerns.   You will get an e-mail in the next two days asking about your experience.  I hope that your e-visit has been valuable and will speed your recovery. Thank you for using e-visits.   Greater than 5 minutes, yet less than 10 minutes of time have been spent researching, coordinating and  implementing care for this patient today.

## 2019-06-22 ENCOUNTER — Telehealth: Payer: Self-pay

## 2019-06-22 NOTE — Telephone Encounter (Signed)
   [facility] Pre-operative Risk Assessment    HEARTCARE STAFF: - Please ensure there is not already an duplicate clearance open for this procedure. - Under Visit Info/Reason for Call, type in Other and utilize the format Clearance MM/DD/YY or Clearance TBD. Do not use dashes or single digits. - If request is for dental extraction, please clarify the # of teeth to be extracted.  Request for surgical clearance:  1. What type of surgery is being performed? Removal of damaged toenail  2. When is this surgery scheduled? tbd  3. What type of clearance is required (medical clearance vs. Pharmacy clearance to hold med vs. Both)? medication  4. Are there any medications that need to be held prior to surgery and how long? Not specified.  5. Practice name and name of physician performing surgery? [facility]-- Dr. Coralie Common, DPM  6. What is the office phone number? [phone]   7.   What is the office fax number? [phone]  8.   Anesthesia type (None, local, MAC, general) ? choice   Brenda Davis Brenda Davis 06/22/2019, 3:43 PM  _________________________________________________________________   (provider comments below)

## 2019-06-23 NOTE — Telephone Encounter (Signed)
Overall very low risk procedure, please verify with the requesting team they are able to do nail removal on aspirin and Brilinta.

## 2019-06-23 NOTE — Telephone Encounter (Signed)
I have faxed over recommendations to requesting surgeon's office.

## 2019-06-27 ENCOUNTER — Other Ambulatory Visit: Payer: Self-pay | Admitting: Nephrology

## 2019-06-27 DIAGNOSIS — N186 End stage renal disease: Secondary | ICD-10-CM

## 2019-06-29 ENCOUNTER — Ambulatory Visit: Payer: Self-pay | Admitting: Podiatry

## 2019-07-13 ENCOUNTER — Other Ambulatory Visit: Payer: BC Managed Care – PPO

## 2019-07-19 ENCOUNTER — Ambulatory Visit: Payer: BC Managed Care – PPO | Admitting: Family Medicine

## 2019-07-19 ENCOUNTER — Other Ambulatory Visit: Payer: Self-pay | Admitting: Nephrology

## 2019-07-19 NOTE — Progress Notes (Deleted)
   I, Brenda Davis, LAT, ATC, am serving as scribe for Dr. Lynne Leader.  Brenda Davis is a 54 y.o. female who presents to [place] [facility] today for R thumb pain and a nodule located on the dorsal aspect of R thumb.  She was last seen by Dr. Georgina Snell on 04/25/19 for L thumb pain and triggering and had a L thumb injection.   Pertinent review of systems: ***  Relevant historical information: ***   Exam:  There were no vitals taken for this visit. General: Well Developed, well nourished, and in no acute distress.   MSK: ***    Lab and Radiology Results No results found for this or any previous visit (from the past 72 hour(s)). No results found.     Assessment and Plan: 55 y.o. female with ***   PDMP not reviewed this encounter. No orders of the defined types were placed in this encounter.  No orders of the defined types were placed in this encounter.    Discussed warning signs or symptoms. Please see discharge instructions. Patient expresses understanding.   ***

## 2019-07-26 ENCOUNTER — Ambulatory Visit
Admission: RE | Admit: 2019-07-26 | Discharge: 2019-07-26 | Disposition: A | Discharge status: Home / Self Care | Payer: Medicare Other | Source: Ambulatory Visit | Attending: Nephrology | Admitting: Nephrology

## 2019-07-26 DIAGNOSIS — N186 End stage renal disease: Secondary | ICD-10-CM

## 2019-07-26 MED ORDER — IOPAMIDOL (ISOVUE-300) INJECTION 61%
125.0000 mL | Freq: Once | INTRAVENOUS | Status: AC | PRN
  Administered 2019-07-26: 125 mL via INTRAVENOUS

## 2019-08-01 NOTE — Progress Notes (Signed)
Cardiology Office Note   Date:  08/02/2019   ID:  Brenda Davis, DOB 03-26-1965, MRN [id number]  PCP:  [name], MD  Cardiologist:  Dr.[place] CC: Follow Up hypertension and rapid HR.    History of Present Illness: Brenda Davis is a 54 y.o. female who presents for ongoing assessment and management of moderate coronary artery disease, hypertension, hyperlipidemia, with other history to include diabetes and end-stage renal disease on peritoneal dialysis.  The patient had a non-STEMI on 01/08/2019 with DES to the proximal RCA and OM1, she also had 75% LAD lesion that was medically managed.  The hospitalization was complicated by right femoral groin hematoma that required exploration and then developed an ulcer.  She came back to the ED on 03/11/2019 with unstable angina and was found to have stent thrombosis of the OM stent.  It was not amenable to PCI and medical management was recommended.  During that hospitalization she developed COVID pneumonia and sepsis.  She initially tested negative for COVID but subsequently tested positive.  Her husband also tested positive and therefore it was not found to be nosocomial.  The patient was treated with remdesivir and steroids.  She also had blood cultures positive for staph capitis.  She was treated with vancomycin/cefepime and then transition to doxycycline.  She was last seen by Dr. Oval Linsey on 05/02/2019.  On her assessment her right groin was healed.  She had not had any further episodes of chest pain or pressure.  She was continued on dialysis.  Her dialysis nurse noted that her pulses in her right leg were not as strong as a the  pulses on the left.  She was to follow-up with vein and vascular for further evaluation.  It was noted that on review of labs her LDL was not at goal.  She was to increase rosuvastatin to 20 mg daily with repeat lipids and CMP in July 2021.  She was to continue dual antiplatelet therapy through  February 2022.  Due to symptomatic hypotension midodrine was increased to 5 mg 3 times daily.  Today she comes anxious about her heart rate and blood pressure.  She stated 2 days last week her heart rate went up to 103-110 while sitting on the couch.  She became nauseous and feeling indigestion.  She did have some sweating.  She states that she got up from the couch and walked to the bedroom where her husband was and had some dyspnea along with near syncope.  Her husband took her blood pressure and found it to be 189/102 with a heart rate of 108.  She rested and he rechecked her blood pressure after 2 to 3 minutes and noticed that it was slowly coming down.  Heart rate was also coming down with lowest around 98.  Prior to that while having dialysis she had sudden onset of nausea rapid heart rate between 103 and 108.  She was also found to be hypertensive.  She was told to follow-up with cardiology.  Since those 2 episodes she has not had any recurrence of rapid heart rate or hypertension.  She has been medically compliant.  She denies any bleeding, excessive bruising, or myalgia pain.  Her nephrologist, Dr.: Ronnald Collum, removed her PD catheter due to frequent infections.  She now has a left brachial AV fistula which is working well.  She is being trained on home dialysis.  Past Medical History:  Diagnosis Date  . Anemia   . CAD (coronary artery disease)  a. cath in 02/2018 showing moderate 3-vessel CAD with 45% mid-LADm 55% LCx and 65% RCA stenosis which was not significant by FFR  . ESRD on dialysis ([place])   . Gout   . HCAP (healthcare-associated pneumonia) 05/06/2016  . History of blood transfusion    "related to surgery"  . Hyperlipidemia   . Hypertension   . Morbid obesity ([place])   . Pain    LEFT SHOULDER PAIN - WAS SEEN AT AN URGENT CARE - GIVEN SLING FOR COMFORT AND TOLD ROM AS TOLERATED.  Marland Kitchen Palpitations 09/24/2016  . Peritonitis, dialysis-associated ([place])   . Type II diabetes mellitus  ([place])    "gastric sleeve OR corrected this" (05/06/2016)    Past Surgical History:  Procedure Laterality Date  . AV FISTULA PLACEMENT Left 01/03/2015   Procedure: BRACHIAL CEPHALIC ARTERIOVENOUS  FISTULA CREATION LEFT ARM;  Surgeon: Angelia Mould, MD;  Location: [place];  Service: Vascular;  Laterality: Left;  . CARPAL TUNNEL RELEASE Right   . [place]  . CHOLECYSTECTOMY  09/14/2017  . CORONARY STENT INTERVENTION N/A 01/17/2019   Procedure: CORONARY STENT INTERVENTION;  Surgeon: Jettie Booze, MD;  Location: [facility];  Service: Cardiovascular;  Laterality: N/A;  . ESOPHAGOGASTRODUODENOSCOPY N/A 09/04/2012   Procedure: ESOPHAGOGASTRODUODENOSCOPY (EGD);  Surgeon: Shann Medal, MD;  Location: [facility];  Service: General;  Laterality: N/A;  PF  . ESOPHAGOGASTRODUODENOSCOPY (EGD) WITH ESOPHAGEAL DILATION N/A 09/29/2012   Procedure: ESOPHAGOGASTRODUODENOSCOPY (EGD) WITH ESOPHAGEAL DILATION;  Surgeon: Milus Banister, MD;  Location: [facility];  Service: Endoscopy;  Laterality: N/A;  . FEMORAL ARTERY EXPLORATION Right 01/17/2019   Procedure: Exploration Right Groin with Primary Closure or Arteriotomy Site; Washout of Retroperitoneal Hematoma;  Surgeon: Waynetta Sandy, MD;  Location: [place];  Service: Vascular;  Laterality: Right;  . INSERTION OF DIALYSIS CATHETER N/A 01/03/2015   Procedure: INSERTION OF DIALYSIS CATHETER RIGHT INTERNAL JUGULAR;  Surgeon: Angelia Mould, MD;  Location: [place];  Service: Vascular;  Laterality: N/A;  . LAPAROSCOPIC GASTRIC SLEEVE RESECTION N/A 07/19/2012   Procedure: LAPAROSCOPIC SLEEVE GASTRECTOMY with EGD;  Surgeon: Madilyn Hook, DO;  Location: [facility];  Service: General;  Laterality: N/A;  laparoscopic sleeve gastrectomy with EGD  . LEFT HEART CATH AND CORONARY ANGIOGRAPHY N/A 02/25/2018   Procedure: LEFT HEART CATH AND CORONARY ANGIOGRAPHY;  Surgeon: Leonie Man, MD;  Location: [place] [facility];   Service: Cardiovascular;  Laterality: N/A;  . LEFT HEART CATH AND CORONARY ANGIOGRAPHY N/A 06/20/2018   Procedure: LEFT HEART CATH AND CORONARY ANGIOGRAPHY;  Surgeon: Nelva Bush, MD;  Location: [facility];  Service: Cardiovascular;  Laterality: N/A;  . LEFT HEART CATH AND CORONARY ANGIOGRAPHY N/A 01/16/2019   Procedure: LEFT HEART CATH AND CORONARY ANGIOGRAPHY;  Surgeon: Jettie Booze, MD;  Location: [facility];  Service: Cardiovascular;  Laterality: N/A;  . LEFT HEART CATH AND CORONARY ANGIOGRAPHY N/A 03/03/2019   Procedure: LEFT HEART CATH AND CORONARY ANGIOGRAPHY;  Surgeon: Jettie Booze, MD;  Location: [facility];  Service: Cardiovascular;  Laterality: N/A;  . PERIPHERAL VASCULAR CATHETERIZATION Left 05/23/2015   Procedure: Nolon Stalls;  Surgeon: Conrad [place], MD;  Location: [facility];  Service: Cardiovascular;  Laterality: Left;  upper aRM  . PERIPHERAL VASCULAR CATHETERIZATION Left 05/23/2015   Procedure: Peripheral Vascular Balloon Angioplasty;  Surgeon: Conrad Hamler, MD;  Location: [facility];  Service: Cardiovascular;  Laterality: Left;  av fistula  .  TUBAL LIGATION  1993  . UPPER GI ENDOSCOPY N/A 07/19/2012   Procedure: UPPER GI ENDOSCOPY;  Surgeon: Madilyn Hook, DO;  Location: [facility];  Service: General;  Laterality: N/A;     Current Outpatient Medications  Medication Sig Dispense Refill  . aspirin EC 81 MG EC tablet Take 1 tablet (81 mg total) by mouth daily. 30 tablet 0  . calcitRIOL (ROCALTROL) 0.5 MCG capsule Take 0.5 mcg by mouth daily.    . calcium acetate (PHOSLO) 667 MG capsule Take 667 mg by mouth 3 (three) times daily with meals.    . cinacalcet (SENSIPAR) 90 MG tablet Take 90 mg by mouth at bedtime.    . Cyanocobalamin (B-12 PO) Take 1 tablet by mouth daily.    . ferric citrate (AURYXIA) 1 GM 210 MG(Fe) tablet Take 2 tablets (420 mg total) by mouth 3 (three) times daily with meals. 629 tablet 0  . folic acid (FOLVITE)  1 MG tablet Take 1 tablet (1 mg total) by mouth daily. 90 tablet 1  . gabapentin (NEURONTIN) 100 MG capsule Take 100 mg by mouth 2 (two) times daily.    Marland Kitchen gentamicin cream (GARAMYCIN) 0.1 % Apply 1 application topically See admin instructions. Apply to exit site daily as directed.  4  . isosorbide dinitrate (ISORDIL) 10 MG tablet Take 1 tablet (10 mg total) by mouth 2 (two) times daily. 360 tablet 0  . midodrine (PROAMATINE) 5 MG tablet Take 1 tablet (5 mg total) by mouth 3 (three) times daily with meals. 90 tablet 3  . nitroGLYCERIN (NITROSTAT) 0.4 MG SL tablet TAKE 1 TABLET BY MOUTH UNDER THE TONGUE EVERY 5 MINUTES AS NEEDED FOR CHEST PAIN 50 tablet 2  . rosuvastatin (CRESTOR) 20 MG tablet Take 1 tablet (20 mg total) by mouth daily. 90 tablet 3  . ticagrelor (BRILINTA) 90 MG TABS tablet Take 1 tablet (90 mg total) by mouth 2 (two) times daily. 180 tablet 3  . carvedilol (COREG) 3.125 MG tablet Take 3.125 mg by mouth 2 (two) times daily.     No current facility-administered medications for this visit.    Allergies:   Amlodipine, Atorvastatin, Clonidine derivatives, Doxycycline, and Welchol [colesevelam hcl]    Social History:  The patient  reports that she quit smoking about 12 years ago. Her smoking use included cigarettes. She has a 16.00 pack-year smoking history. She has never used smokeless tobacco. She reports that she does not drink alcohol and does not use drugs.   Family History:  The patient's family history includes Arthritis in an other family member; Diabetes in her father; Hyperlipidemia in an other family member; Hypertension in her mother; Mitral valve prolapse in her mother.    ROS: All other systems are reviewed and negative. Unless otherwise mentioned in H&P    PHYSICAL EXAM: VS:  BP 124/80   Pulse 80   Ht 5' 5"  (1.651 m)   Wt 280 lb 8 oz (127.2 kg)   BMI 46.68 kg/m  , BMI Body mass index is 46.68 kg/m. GEN: Well nourished, well developed, in no acute distress,  obese HEENT: normal Neck: no JVD, carotid bruits, or masses Cardiac: RRR; no murmurs, rubs, or gallops,no edema left brachial AV fistula Respiratory:  Clear to auscultation bilaterally, normal work of breathing GI: soft, nontender, nondistended, + BS MS: no deformity or atrophy Skin: warm and dry, no rash Neuro:  Strength and sensation are intact Psych: euthymic mood, full affect   EKG: Normal sinus rhythm with possible left  atrial enlargement, QT interval 438 ms with a QTC of 495 ms.  (Personally reviewed) compared to 05/02/2019 essentially unchanged.  Recent Labs: 03/14/2019: ALT 14 04/04/2019: BUN 51; Creatinine, Ser 11.91; Hemoglobin 10.2; Magnesium 1.9; Platelets 333.0; Potassium 3.2; Sodium 140    Lipid Panel    Component Value Date/Time   CHOL 185 01/16/2019 0449   CHOL 191 12/26/2018 1022   TRIG 150 (H) 01/16/2019 0449   HDL 39 (L) 01/16/2019 0449   HDL 44 12/26/2018 1022   CHOLHDL 4.7 01/16/2019 0449   VLDL 30 01/16/2019 0449   LDLCALC 116 (H) 01/16/2019 0449   LDLCALC 109 (H) 12/26/2018 1022   LDLDIRECT 161.6 11/28/2012 1042      Wt Readings from Last 3 Encounters:  08/02/19 280 lb 8 oz (127.2 kg)  05/02/19 278 lb 6.4 oz (126.3 kg)  04/25/19 276 lb (125.2 kg)      Other studies Reviewed: Echo 01/04/2015: Study Conclusions  - Left ventricle: The cavity size was normal. Wall thickness was normal. Systolic function was vigorous. The estimated ejection fraction was in the range of 65% to 70%. Features are consistent with a pseudonormal left ventricular filling pattern, with concomitant abnormal relaxation and increased filling pressure (grade 2 diastolic dysfunction). - Mitral valve: There was mild regurgitation. - Pericardium, extracardiac: A trivial pericardial effusion was identified.   48 Hour Holter Monitor 10/08/2016:  Quality: Fair. Baseline artifact. Predominant rhythm: sinus rhythm Average heart rate: 64 bpm Max heart rate: 94  bpm Min heart rate: 50 bpm  Occasional PACs were noted that did not correspond with the fluttering reported by the patient.  LHC 06/20/18: Conclusions: 1. Moderately severe multivessel coronary artery disease not significantly changed since prior catheterization in 02/2018. DFR of the most severe lesion in the RCA at that time was not hemodynamically significant. 2. Normal left ventricular filling pressure. 3. Hyperdynamic left ventricular contraction.  Recommendations: 1. Medical therapy for moderate diffuse coronary artery disease. There is no clear culprit for PCI. I suspect troponin elevation reflects supply-demand mismatch. I will start isosorbide mononitrate 30 mg daily, which can be escalated as blood pressure tolerates for relief of chest pain. 2. Aggressive secondary prevention, including high-intensity statin therapy. 3. If no evidence of bleeding complications and no recurrence of chest pain, discharge this afternoon or tomorrow could be considered from a cardiac standpoint.   Cardiac catheterization 01/17/2019  Mid LAD lesion is 75% stenosed. The LAD was not addressed and will be managed medically.  Ost LAD to Mid LAD lesion is 40% stenosed.  Mid Cx to Dist Cx lesion is 55% stenosed with 40% stenosed side branch in Ost 3rd Mrg.  Prox RCA lesion is 75% stenosed.  A drug-eluting stent was successfully placed using a SYNERGY XD 3.0X38, postdilated to 3.5 mm.  Post intervention, there is a 0% residual stenosis.  Ost 1st Mrg lesion is 99% stenosed. EBU4 Guide catheter used.  A drug-eluting stent was successfully placed using a SYNERGY XD 2.25X24, postdilated to 2.75 mm.  Post intervention, there is a 0% residual stenosis.  Continue DAPT for 12 months. Would recommend clopidogrel long-term after one year since she has such diffuse disease. Continue aggressive secondary prevention.  Cardiac Cath 03/03/19  Mid LAD lesion is 75% stenosed.  Ost LAD to Mid LAD  lesion is 40% stenosed.  Previously placed Ost 1st Mrg drug eluting stent is widely patent.  Mid Cx to Dist Cx lesion is 55% stenosed with 40% stenosed side branch in Ost 3rd Mrg.  Previously  placed Prox RCA drug eluting stent is widely patent.  Ost 1st Mrg to 1st Mrg lesion is 99% stenosed. Partially in the stent and partially distal to the stent- subacte stent thrombosis.  LV end diastolic pressure is normal.  There is no aortic valve stenosis. Medical therapy for stent thrombosis of the OM stent. Given the severe tortuosity, I don't think repeat PCI attempt would be beneficial. There was significant difficulty getting a stent delivered during the last cath. I think there would be a high likelihood of completely occluding the vessel with wire manipulation.  Echocardiogram 01/16/2019 IMPRESSIONS 1. Left ventricular ejection fraction, by visual estimation, is 65 to 70%. The left ventricle has hyperdynamic function. There is moderately increased left ventricular hypertrophy. 2. Definity contrast agent was given IV to delineate the left ventricular endocardial borders. 3. Left ventricular diastolic parameters are consistent with Grade I diastolic dysfunction (impaired relaxation). 4. The left ventricle has no regional wall motion abnormalities. 5. Global right ventricle has normal systolic function.The right ventricular size is mildly enlarged. No increase in right ventricular wall thickness. 6. Left atrial size was normal. 7. Right atrial size was normal. 8. The pericardial effusion is posterior to the left ventricle. 9. Trivial pericardial effusion is present. 10. The mitral valve was not well visualized. Trivial mitral valve regurgitation. 11. The tricuspid valve is not well visualized. 12. The aortic valve is tricuspid. Aortic valve regurgitation is not visualized. Mild aortic valve sclerosis without stenosis. 13. The pulmonic valve was grossly normal. Pulmonic valve  regurgitation is not visualized. 14. The inferior vena cava is normal in size with <50% respiratory variability, suggesting right atrial pressure of 8 mmHg. 15. The interatrial septum was not well visualized.   ASSESSMENT AND PLAN:  1.  Intermittent rapid heart rhythm: This is associated with diaphoresis and dyspnea with near syncope.  Short-lived but worrisome to the patient.  We will plan a 2-week ZIO cardiac monitor to evaluate for frequency morphology and duration of rapid heart rhythm.  Labs are being drawn by dialysis nurse.  We will have to get a copy in order to review potassium status and for any evidence of anemia.  Due to obesity she may have PAF, and/or OSA.  I do not see where this is been evaluated with sleep study.  Can consider this in the future.  2.  Hypertension: Currently blood pressure is well controlled at 124/80.  She is on carvedilol 3.125 mg twice daily along with isosorbide dinitrate 10 mg twice daily.  She is also on midodrine which she takes as needed during dialysis for hypotension.  3.  Coronary artery disease: History of DES to the proximal RCA and OM1 with 75% LAD lesion which is medically managed.  Repeat cath on 03/11/2019 in the setting of unstable angina found that she had in-stent thrombosis of the OM stent.  It was not amenable to PCI.  She continues on dual antiplatelet therapy with aspirin and ticagrelor.  Heart rate is controlled currently on carvedilol 3.125 mg twice daily.  May consider adjustment of this dose or change to different AV nodal blocker based upon cardiac monitor results.  4.  Hyperlipidemia: Goal of LDL less than 70.  Continue rosuvastatin 20 mg daily.  Will need follow-up lipids and LFTs on next appointment if not already completed by PCP.   Current medicines are reviewed at length with the patient today.  I have spent 30 mnutes dedicated to the care of this patient on the date of this encounter  to include pre-visit review of records,  assessment, management and diagnostic testing,with shared decision making.  Labs/ tests ordered today include: 2 week [facility] Cardiac Monitor   Phill Myron. West Pugh, ANP, AACC   08/02/2019 12:21 PM    [facility] [place] [address] Office [phone] Fax [phone]  Notice: This dictation was prepared with Dragon dictation along with smaller phrase technology. Any transcriptional errors that result from this process are unintentional and may not be corrected upon review.

## 2019-08-02 ENCOUNTER — Other Ambulatory Visit: Payer: Self-pay

## 2019-08-02 ENCOUNTER — Ambulatory Visit (INDEPENDENT_AMBULATORY_CARE_PROVIDER_SITE_OTHER): Payer: Medicare Other | Admitting: Adult Health

## 2019-08-02 ENCOUNTER — Telehealth: Payer: Self-pay | Admitting: Radiology

## 2019-08-02 ENCOUNTER — Encounter: Payer: Self-pay | Admitting: Adult Health

## 2019-08-02 VITALS — BP 124/80 | HR 80 | Ht 65.0 in | Wt 280.5 lb

## 2019-08-02 DIAGNOSIS — I200 Unstable angina: Secondary | ICD-10-CM

## 2019-08-02 DIAGNOSIS — R000 Tachycardia, unspecified: Secondary | ICD-10-CM | POA: Diagnosis not present

## 2019-08-02 DIAGNOSIS — I2510 Atherosclerotic heart disease of native coronary artery without angina pectoris: Secondary | ICD-10-CM

## 2019-08-02 DIAGNOSIS — N186 End stage renal disease: Secondary | ICD-10-CM

## 2019-08-02 DIAGNOSIS — Z992 Dependence on renal dialysis: Secondary | ICD-10-CM

## 2019-08-02 DIAGNOSIS — E785 Hyperlipidemia, unspecified: Secondary | ICD-10-CM | POA: Diagnosis not present

## 2019-08-02 DIAGNOSIS — I10 Essential (primary) hypertension: Secondary | ICD-10-CM

## 2019-08-02 DIAGNOSIS — R002 Palpitations: Secondary | ICD-10-CM

## 2019-08-02 NOTE — Patient Instructions (Signed)
Medication Instructions:  Continue current medications  *If you need a refill on your cardiac medications before your next appointment, please call your pharmacy*   Lab Work: None Ordered   Testing/Procedures: Your physician has recommended that you wear a Zio monitor. Holter monitors are medical devices that record the heart's electrical activity. Doctors most often use these monitors to diagnose arrhythmias. Arrhythmias are problems with the speed or rhythm of the heartbeat. The monitor is a small, portable device. You can wear one while you do your normal daily activities. This is usually used to diagnose what is causing palpitations/syncope (passing out).   Follow-Up: At [facility], you and your health needs are our priority.  As part of our continuing mission to provide you with exceptional heart care, we have created designated Provider Care Teams.  These Care Teams include your primary Cardiologist (physician) and Advanced Practice Providers (APPs -  Physician Assistants and Nurse Practitioners) who all work together to provide you with the care you need, when you need it.  We recommend signing up for the patient portal called "MyChart".  Sign up information is provided on this After Visit Summary.  MyChart is used to connect with patients for Virtual Visits (Telemedicine).  Patients are able to view lab/test results, encounter notes, upcoming appointments, etc.  Non-urgent messages can be sent to your provider as well.   To learn more about what you can do with MyChart, go to [email or web address].    Your next appointment:   6 week(s)  The format for your next appointment:   In Person  Provider:   You may see Skeet Latch, MD or one of the following Advanced Practice Providers on your designated Care Team:    Kerin Ransom, PA-C  [place], [place]  Coletta Memos, [place]    Other Instructions [place] Monitor Instructions   Your physician has  requested you wear your ZIO patch monitor 14 days.   This is a single patch monitor.  [employer] supplies one patch monitor per enrollment.  Additional stickers are not available.   Please do not apply patch if you will be having a Nuclear Stress Test, Echocardiogram, Cardiac CT, MRI, or Chest Xray during the time frame you would be wearing the monitor. The patch cannot be worn during these tests.  You cannot remove and re-apply the ZIO XT patch monitor.   Your ZIO patch monitor will be sent USPS Priority mail from [facility] directly to your home address. The monitor may also be mailed to a PO BOX if home delivery is not available.   It may take 3-5 days to receive your monitor after you have been enrolled.   Once you have received you monitor, please review enclosed instructions.  Your monitor has already been registered assigning a specific monitor serial # to you.   Applying the monitor   Shave hair from upper left chest.   Hold abrader disc by orange tab.  Rub abrader in 40 strokes over left upper chest as indicated in your monitor instructions.   Clean area with 4 enclosed alcohol pads .  Use all pads to assure are is cleaned thoroughly.  Let dry.   Apply patch as indicated in monitor instructions.  Patch will be place under collarbone on left side of chest with arrow pointing upward.   Rub patch adhesive wings for 2 minutes.Remove white label marked "1".  Remove white label marked "2".  Rub patch adhesive wings for 2 additional minutes.  While looking in a mirror, press and release button in center of patch.  A small green light will flash 3-4 times .  This will be your only indicator the monitor has been turned on.     Do not shower for the first 24 hours.  You may shower after the first 24 hours.   Press button if you feel a symptom. You will hear a small click.  Record Date, Time and Symptom in the Patient Log Book.   When you are ready to remove patch, follow  instructions on last 2 pages of Patient Log Book.  Stick patch monitor onto last page of Patient Log Book.   Place Patient Log Book in [place] box.  Use locking tab on box and tape box closed securely.  The [employer] and [employer] has [employer] on it.  Please place in mailbox as soon as possible.  Your physician should have your test results approximately 7 days after the monitor has been mailed back to [facility].   Call [place] at [phone] if you have questions regarding your ZIO XT patch monitor.  Call them immediately if you see an orange light blinking on your monitor.   If your monitor falls off in less than 4 days contact our Monitor department at [phone].  If your monitor becomes loose or falls off after 4 days call [employer] at [phone] for suggestions on securing your monitor.

## 2019-08-02 NOTE — Telephone Encounter (Signed)
Enrolled patient for a 14 day Zio AT monitor to be mailed to patients home.

## 2019-08-03 ENCOUNTER — Telehealth: Payer: Self-pay

## 2019-08-03 NOTE — Telephone Encounter (Signed)
rec'vd referral from Dr. Marval Regal requesting fistulogram d/t poorly functioning AVF and difficulty with cannulation, sent to to surgery scheduling

## 2019-08-13 ENCOUNTER — Encounter (INDEPENDENT_AMBULATORY_CARE_PROVIDER_SITE_OTHER): Payer: Medicare Other

## 2019-08-13 DIAGNOSIS — R000 Tachycardia, unspecified: Secondary | ICD-10-CM | POA: Diagnosis not present

## 2019-08-14 ENCOUNTER — Other Ambulatory Visit: Payer: Self-pay

## 2019-08-14 MED ORDER — SODIUM CHLORIDE 0.9% FLUSH
3.0000 mL | Freq: Two times a day (BID) | INTRAVENOUS | Status: DC
Start: 2019-08-14 — End: 2020-02-23

## 2019-08-14 MED ORDER — SODIUM CHLORIDE 0.9% FLUSH
3.0000 mL | INTRAVENOUS | Status: DC | PRN
Start: 2019-08-14 — End: 2020-02-23

## 2019-08-14 MED ORDER — SODIUM CHLORIDE 0.9 % IV SOLN
250.0000 mL | INTRAVENOUS | Status: DC | PRN
Start: 2019-08-14 — End: 2020-02-23

## 2019-08-18 ENCOUNTER — Encounter (HOSPITAL_COMMUNITY): Payer: Self-pay | Admitting: Vascular Surgery

## 2019-08-18 ENCOUNTER — Other Ambulatory Visit: Payer: Self-pay

## 2019-08-18 ENCOUNTER — Ambulatory Visit (HOSPITAL_COMMUNITY)
Admission: RE | Admit: 2019-08-18 | Discharge: 2019-08-18 | Disposition: A | Discharge status: Home / Self Care | Payer: Medicare Other | Attending: Vascular Surgery | Admitting: Vascular Surgery

## 2019-08-18 ENCOUNTER — Encounter (HOSPITAL_COMMUNITY)
Admission: RE | Disposition: A | Discharge status: Home / Self Care | Payer: Self-pay | Source: Home / Self Care | Attending: Vascular Surgery

## 2019-08-18 DIAGNOSIS — Z8249 Family history of ischemic heart disease and other diseases of the circulatory system: Secondary | ICD-10-CM | POA: Insufficient documentation

## 2019-08-18 DIAGNOSIS — E785 Hyperlipidemia, unspecified: Secondary | ICD-10-CM | POA: Insufficient documentation

## 2019-08-18 DIAGNOSIS — I120 Hypertensive chronic kidney disease with stage 5 chronic kidney disease or end stage renal disease: Secondary | ICD-10-CM | POA: Insufficient documentation

## 2019-08-18 DIAGNOSIS — Z6841 Body Mass Index (BMI) 40.0 and over, adult: Secondary | ICD-10-CM | POA: Diagnosis not present

## 2019-08-18 DIAGNOSIS — Z888 Allergy status to other drugs, medicaments and biological substances status: Secondary | ICD-10-CM | POA: Diagnosis not present

## 2019-08-18 DIAGNOSIS — Z992 Dependence on renal dialysis: Secondary | ICD-10-CM

## 2019-08-18 DIAGNOSIS — Z881 Allergy status to other antibiotic agents status: Secondary | ICD-10-CM | POA: Insufficient documentation

## 2019-08-18 DIAGNOSIS — M109 Gout, unspecified: Secondary | ICD-10-CM | POA: Insufficient documentation

## 2019-08-18 DIAGNOSIS — Z87891 Personal history of nicotine dependence: Secondary | ICD-10-CM | POA: Insufficient documentation

## 2019-08-18 DIAGNOSIS — Z7982 Long term (current) use of aspirin: Secondary | ICD-10-CM | POA: Diagnosis not present

## 2019-08-18 DIAGNOSIS — T82858A Stenosis of vascular prosthetic devices, implants and grafts, initial encounter: Secondary | ICD-10-CM | POA: Insufficient documentation

## 2019-08-18 DIAGNOSIS — N186 End stage renal disease: Secondary | ICD-10-CM

## 2019-08-18 DIAGNOSIS — Z79899 Other long term (current) drug therapy: Secondary | ICD-10-CM | POA: Insufficient documentation

## 2019-08-18 DIAGNOSIS — T82898A Other specified complication of vascular prosthetic devices, implants and grafts, initial encounter: Secondary | ICD-10-CM

## 2019-08-18 DIAGNOSIS — Z833 Family history of diabetes mellitus: Secondary | ICD-10-CM | POA: Diagnosis not present

## 2019-08-18 DIAGNOSIS — E1122 Type 2 diabetes mellitus with diabetic chronic kidney disease: Secondary | ICD-10-CM | POA: Diagnosis not present

## 2019-08-18 DIAGNOSIS — I2510 Atherosclerotic heart disease of native coronary artery without angina pectoris: Secondary | ICD-10-CM | POA: Diagnosis not present

## 2019-08-18 DIAGNOSIS — Y832 Surgical operation with anastomosis, bypass or graft as the cause of abnormal reaction of the patient, or of later complication, without mention of misadventure at the time of the procedure: Secondary | ICD-10-CM | POA: Diagnosis not present

## 2019-08-18 HISTORY — PX: PERIPHERAL VASCULAR BALLOON ANGIOPLASTY: CATH118281

## 2019-08-18 LAB — POCT I-STAT, CHEM 8
BUN: 26 mg/dL — ABNORMAL HIGH (ref 6–20)
Calcium, Ion: 1.12 mmol/L — ABNORMAL LOW (ref 1.15–1.40)
Chloride: 94 mmol/L — ABNORMAL LOW (ref 98–111)
Creatinine, Ser: 6.8 mg/dL — ABNORMAL HIGH (ref 0.44–1.00)
Glucose, Bld: 122 mg/dL — ABNORMAL HIGH (ref 70–99)
HCT: 37 % (ref 36.0–46.0)
Hemoglobin: 12.6 g/dL (ref 12.0–15.0)
Potassium: 4.4 mmol/L (ref 3.5–5.1)
Sodium: 136 mmol/L (ref 135–145)
TCO2: 31 mmol/L (ref 22–32)

## 2019-08-18 SURGERY — PERIPHERAL VASCULAR BALLOON ANGIOPLASTY
Anesthesia: LOCAL | Laterality: Left

## 2019-08-18 MED ORDER — MIDAZOLAM HCL 2 MG/2ML IJ SOLN
INTRAMUSCULAR | Status: DC | PRN
  Administered 2019-08-18 (×2): 1 mg via INTRAVENOUS

## 2019-08-18 MED ORDER — FENTANYL CITRATE (PF) 100 MCG/2ML IJ SOLN
INTRAMUSCULAR | Status: DC | PRN
  Administered 2019-08-18 (×2): 25 ug via INTRAVENOUS

## 2019-08-18 MED ORDER — HEPARIN SODIUM (PORCINE) 1000 UNIT/ML IJ SOLN
INTRAMUSCULAR | Status: AC
  Filled 2019-08-18: qty 1

## 2019-08-18 MED ORDER — HEPARIN SODIUM (PORCINE) 1000 UNIT/ML IJ SOLN
INTRAMUSCULAR | Status: DC | PRN
  Administered 2019-08-18: 3000 [IU] via INTRAVENOUS

## 2019-08-18 MED ORDER — LIDOCAINE HCL (PF) 1 % IJ SOLN
INTRAMUSCULAR | Status: DC | PRN
  Administered 2019-08-18: 5 mL via INTRADERMAL

## 2019-08-18 MED ORDER — IODIXANOL 320 MG/ML IV SOLN
INTRAVENOUS | Status: DC | PRN
  Administered 2019-08-18: 75 mL

## 2019-08-18 MED ORDER — FENTANYL CITRATE (PF) 100 MCG/2ML IJ SOLN
INTRAMUSCULAR | Status: AC
  Filled 2019-08-18: qty 2

## 2019-08-18 MED ORDER — HEPARIN (PORCINE) IN NACL 1000-0.9 UT/500ML-% IV SOLN
INTRAVENOUS | Status: DC | PRN
  Administered 2019-08-18: 500 mL

## 2019-08-18 MED ORDER — HEPARIN (PORCINE) IN NACL 1000-0.9 UT/500ML-% IV SOLN
INTRAVENOUS | Status: AC
  Filled 2019-08-18: qty 500

## 2019-08-18 MED ORDER — MIDAZOLAM HCL 2 MG/2ML IJ SOLN
INTRAMUSCULAR | Status: AC
  Filled 2019-08-18: qty 2

## 2019-08-18 MED ORDER — LIDOCAINE HCL (PF) 1 % IJ SOLN
INTRAMUSCULAR | Status: AC
  Filled 2019-08-18: qty 30

## 2019-08-18 SURGICAL SUPPLY — 16 items
BAG SNAP BAND KOVER 36X36 (MISCELLANEOUS) ×3 IMPLANT
BALLN IN.PACT DCB 7X80 (BALLOONS) ×3
BALLN MUSTANG 7X80X75 (BALLOONS) ×3
BALLOON MUSTANG 7X80X75 (BALLOONS) ×2 IMPLANT
COVER DOME SNAP 22 D (MISCELLANEOUS) ×3 IMPLANT
DCB IN.PACT 7X80 (BALLOONS) ×1 IMPLANT
KIT ENCORE 26 ADVANTAGE (KITS) ×3 IMPLANT
KIT MICROPUNCTURE NIT STIFF (SHEATH) ×2 IMPLANT
PROTECTION STATION PRESSURIZED (MISCELLANEOUS) ×3
SHEATH PINNACLE R/O II 7F 4CM (SHEATH) ×2 IMPLANT
SHEATH PROBE COVER 6X72 (BAG) ×5 IMPLANT
STATION PROTECTION PRESSURIZED (MISCELLANEOUS) ×2 IMPLANT
STOPCOCK MORSE 400PSI 3WAY (MISCELLANEOUS) ×3 IMPLANT
TRAY PV CATH (CUSTOM PROCEDURE TRAY) ×3 IMPLANT
TUBING CIL FLEX 10 FLL-RA (TUBING) ×3 IMPLANT
WIRE BENTSON .035X145CM (WIRE) ×2 IMPLANT

## 2019-08-18 NOTE — Discharge Instructions (Signed)

## 2019-08-18 NOTE — Op Note (Addendum)
    OPERATIVE NOTE   PROCEDURE: 1. left brachiocephalic arteriovenous fistula cannulation under ultrasound guidance 2. left arm fistulogram including central venogram 3. left cephalic vein venoplasty at cephalic arch and cephalic vein in mid upper arm (7 mm x 80 mm Mustang and 7 mm drug coated Impact)   PRE-OPERATIVE DIAGNOSIS: Malfunctioning left arteriovenous fistula  POST-OPERATIVE DIAGNOSIS: same as above   SURGEON: Marty Heck, MD  ANESTHESIA: local  ESTIMATED BLOOD LOSS: 5 cc  FINDING(S): 1. Patient had a high-grade >80% stenosis at the cephalic arch as well as a second stenosis adjacent to an aneurysmal segment in the mid upper arm approximately 60%.  Both of these were angioplastied with 7 mm Mustang's and 7 mm drug-coated impact.  She has an excellent thrill upon completion.  I was not comfortable being more aggressive given significant pain with angioplasty of the cephalic arch lesion with a 7 mm balloon.  Less than 30% residual stenosis and excellent thrill.  SPECIMEN(S):  None  CONTRAST: 75 cc  INDICATIONS: Brenda Davis is a 54 y.o. female who  presents with malfunctioning right and left brachiocepahlic  arteriovenous fistula.  The patient is scheduled for left arm fistulogram.  The patient is aware the risks include but are not limited to: bleeding, infection, thrombosis of the cannulated access, and possible anaphylactic reaction to the contrast.  The patient is aware of the risks of the procedure and elects to proceed forward.  DESCRIPTION: After full informed written consent was obtained, the patient was brought back to the angiography suite and placed supine upon the angiography table.  The patient was connected to monitoring equipment.  The left arm was prepped and draped in the standard fashion for a left arm fistulogram.  Under ultrasound guidance, the left brachiocephalic arteriovenous fistula was evaluated, it was patent, an image was saved.   It was cannulated with a micropuncture needle.  The microwire was advanced into the fistula and the needle was exchanged for the a microsheath, which was lodged 2 cm into the access.  The wire was removed and the sheath was connected to the IV extension tubing.  Hand injections were completed to image the access from the antecubitum up to the level of axilla.  Ultimately elected to treat two lesions.  Used a Astronomer exchanged for a short 7 French sheath in the fistula.  Patient was given 3000 units of IV heparin.  Initially treated the cephalic arch lesion with a 7 mm x 80 mm Mustang to nominal pressure for 2 minutes and then treated the second mid upper arm lesion adjacent to the aneurysm with a 7 mm 80 mm Mustang to nominal pressure for 2 minutes.  We did get a reflux shot that did not show any proximal stenosis.  Patient had significant pain with a 7 mm angioplasty balloon at the cephalic arch.  I elected to then treat both lesions with drug-coated balloon angioplasty.  There was less than 30% residual stenosis excellent thrill in the fistula.  Wires and catheters were then removed and a 4-0 pursestring was tied down.  COMPLICATIONS: None  CONDITION: Stable  Marty Heck, MD [facility] Office: [phone]  08/18/2019 11:25 AM

## 2019-08-18 NOTE — H&P (Signed)
H&P    MRN #:  [id number]  History of Present Illness: This is a 54 y.o. female with end-stage renal disease that presents for left arm fistulogram.  Patient states she was doing peritoneal dialysis but then got peritonitis and has now gone back to using her left arm fistula.  She states this was placed about 5 years ago and had worked well for the first 2 years.  She states their access is fine and she is having no bleeding events but the pressure is high during dialysis and this issue has been ongoing since June.  Past Medical History:  Diagnosis Date  . Anemia   . CAD (coronary artery disease)    a. cath in 02/2018 showing moderate 3-vessel CAD with 45% mid-LADm 55% LCx and 65% RCA stenosis which was not significant by FFR  . ESRD on dialysis ([place])   . Gout   . HCAP (healthcare-associated pneumonia) 05/06/2016  . History of blood transfusion    "related to surgery"  . Hyperlipidemia   . Hypertension   . Morbid obesity ([place])   . Pain    LEFT SHOULDER PAIN - WAS SEEN AT AN URGENT CARE - GIVEN SLING FOR COMFORT AND TOLD ROM AS TOLERATED.  Marland Kitchen Palpitations 09/24/2016  . Peritonitis, dialysis-associated ([place])   . Type II diabetes mellitus ([place])    "gastric sleeve OR corrected this" (05/06/2016)    Past Surgical History:  Procedure Laterality Date  . AV FISTULA PLACEMENT Left 01/03/2015   Procedure: BRACHIAL CEPHALIC ARTERIOVENOUS  FISTULA CREATION LEFT ARM;  Surgeon: Angelia Mould, MD;  Location: [facility];  Service: Vascular;  Laterality: Left;  . CARPAL TUNNEL RELEASE Right   . [place]  . CHOLECYSTECTOMY  09/14/2017  . CORONARY STENT INTERVENTION N/A 01/17/2019   Procedure: CORONARY STENT INTERVENTION;  Surgeon: Jettie Booze, MD;  Location: [facility];  Service: Cardiovascular;  Laterality: N/A;  . ESOPHAGOGASTRODUODENOSCOPY N/A 09/04/2012   Procedure: ESOPHAGOGASTRODUODENOSCOPY (EGD);  Surgeon: Shann Medal, MD;  Location: [facility];   Service: General;  Laterality: N/A;  PF  . ESOPHAGOGASTRODUODENOSCOPY (EGD) WITH ESOPHAGEAL DILATION N/A 09/29/2012   Procedure: ESOPHAGOGASTRODUODENOSCOPY (EGD) WITH ESOPHAGEAL DILATION;  Surgeon: Milus Banister, MD;  [facility]: [facility];  Service: Endoscopy;  Laterality: N/A;  . FEMORAL ARTERY EXPLORATION Right 01/17/2019   Procedure: Exploration Right Groin with Primary Closure or Arteriotomy Site; Washout of Retroperitoneal Hematoma;  Surgeon: Waynetta Sandy, MD;  Location: [facility];  Service: Vascular;  Laterality: Right;  . INSERTION OF DIALYSIS CATHETER N/A 01/03/2015   Procedure: INSERTION OF DIALYSIS CATHETER RIGHT INTERNAL JUGULAR;  Surgeon: Angelia Mould, MD;  Location: [facility];  Service: Vascular;  Laterality: N/A;  . LAPAROSCOPIC GASTRIC SLEEVE RESECTION N/A 07/19/2012   Procedure: LAPAROSCOPIC SLEEVE GASTRECTOMY with EGD;  Surgeon: Madilyn Hook, DO;  [facility];  Service: General;  Laterality: N/A;  laparoscopic sleeve gastrectomy with EGD  . LEFT HEART CATH AND CORONARY ANGIOGRAPHY N/A 02/25/2018   Procedure: LEFT HEART CATH AND CORONARY ANGIOGRAPHY;  Surgeon: Leonie Man, MD;  Location: [facility];  Service: Cardiovascular;  Laterality: N/A;  . LEFT HEART CATH AND CORONARY ANGIOGRAPHY N/A 06/20/2018   Procedure: LEFT HEART CATH AND CORONARY ANGIOGRAPHY;  Surgeon: Nelva Bush, MD;  Location: [facility];  Service: Cardiovascular;  Laterality: N/A;  . LEFT HEART CATH AND CORONARY ANGIOGRAPHY N/A 01/16/2019   Procedure: LEFT HEART CATH AND CORONARY ANGIOGRAPHY;  Surgeon: Larae Grooms  S, MD;  Location: [place] [facility];  Service: Cardiovascular;  Laterality: N/A;  . LEFT HEART CATH AND CORONARY ANGIOGRAPHY N/A 03/03/2019   Procedure: LEFT HEART CATH AND CORONARY ANGIOGRAPHY;  Surgeon: Jettie Booze, MD;  Location: [facility];  Service: Cardiovascular;  Laterality: N/A;  . PERIPHERAL VASCULAR CATHETERIZATION Left 05/23/2015    Procedure: Nolon Stalls;  Surgeon: Conrad [place], MD;  Location: [facility];  Service: Cardiovascular;  Laterality: Left;  upper aRM  . PERIPHERAL VASCULAR CATHETERIZATION Left 05/23/2015   Procedure: Peripheral Vascular Balloon Angioplasty;  Surgeon: Conrad Frankford, MD;  Location: [facility] [facility];  Service: Cardiovascular;  Laterality: Left;  av fistula  . TUBAL LIGATION  1993  . UPPER GI ENDOSCOPY N/A 07/19/2012   Procedure: UPPER GI ENDOSCOPY;  Surgeon: Madilyn Hook, DO;  [facility];  Service: General;  Laterality: N/A;    Allergies  Allergen Reactions  . Amlodipine Swelling  . Atorvastatin     Elevated LFT's  . Clonidine Derivatives Swelling    Limbs swell  . Doxycycline Nausea And Vomiting    "I threw up for 3 hours"  . Welchol [Colesevelam Hcl] Nausea Only    Prior to Admission medications   Medication Sig Start Date End Date Taking? Authorizing Provider  acetaminophen (TYLENOL) 500 MG tablet Take 1,000 mg by mouth every 6 (six) hours as needed for moderate pain or headache.   Yes [provider]  aspirin EC 81 MG EC tablet Take 1 tablet (81 mg total) by mouth daily. 02/26/18  Yes Smith, Eustaquio Boyden A, MD  calcitRIOL (ROCALTROL) 0.5 MCG capsule Take 1.5 mcg by mouth daily.  04/29/19  Yes [provider]  calcium acetate (PHOSLO) 667 MG capsule Take 667-2,001 mg by mouth See admin instructions. Take 2001 mg with each meal and 667 mg with each snack 06/02/18  Yes [provider]  carvedilol (COREG) 3.125 MG tablet Take 3.125 mg by mouth 2 (two) times daily as needed (high bp).  07/31/19  Yes [provider]  ferric citrate (AURYXIA) 1 GM 210 MG(Fe) tablet Take 2 tablets (420 mg total) by mouth 3 (three) times daily with meals. 01/23/19  Yes Daune Perch, NP  gabapentin (NEURONTIN) 100 MG capsule Take 100 mg by mouth 2 (two) times daily. 10/31/18  Yes [provider]  isosorbide dinitrate (ISORDIL) 10 MG tablet Take 1 tablet (10 mg total)  by mouth 2 (two) times daily. 04/05/19  Yes [name], MD  midodrine (PROAMATINE) 5 MG tablet Take 1 tablet (5 mg total) by mouth 3 (three) times daily with meals. Patient taking differently: Take 5 mg by mouth 2 (two) times daily as needed (low bp).  05/02/19  Yes Skeet Latch, MD  nitroGLYCERIN (NITROSTAT) 0.4 MG SL tablet TAKE 1 TABLET BY MOUTH UNDER THE TONGUE EVERY 5 MINUTES AS NEEDED FOR CHEST PAIN Patient taking differently: Place 0.4 mg under the tongue every 5 (five) minutes as needed for chest pain.  03/24/19  Yes Skeet Latch, MD  rosuvastatin (CRESTOR) 10 MG tablet Take 10 mg by mouth daily.   Yes [provider]  rosuvastatin (CRESTOR) 20 MG tablet Take 1 tablet (20 mg total) by mouth daily. 05/02/19 08/18/19 Yes Skeet Latch, MD  ticagrelor (BRILINTA) 90 MG TABS tablet Take 1 tablet (90 mg total) by mouth 2 (two) times daily. 02/01/19 01/27/20 Yes Cleaver, Jossie Ng, NP  folic acid (FOLVITE) 1 MG tablet Take 1 tablet (1 mg total) by mouth  daily. Patient not taking: Reported on 08/15/2019 03/08/18   [name], MD    Social History   Socioeconomic History  . Marital status: Married    Spouse name: Not on file  . Number of children: 3  . Years of education: Not on file  . Highest education level: Not on file  Occupational History  . Occupation: Research scientist (physical sciences): [employer]  Tobacco Use  . Smoking status: Former Smoker    Packs/day: 1.00    Years: 16.00    Pack years: 16.00    Types: Cigarettes    Quit date: 2009    Years since quitting: 12.5  . Smokeless tobacco: Never Used  Vaping Use  . Vaping Use: Never used  Substance and Sexual Activity  . Alcohol use: Never  . Drug use: No  . Sexual activity: Yes    Birth control/protection: Post-menopausal  Other Topics Concern  . Not on file  Social History Narrative  . Not on file   Social Determinants of Health   Financial Resource Strain:   . Difficulty of Paying Living Expenses:     Food Insecurity:   . Worried About Charity fundraiser in the Last Year:   . Arboriculturist in the Last Year:   Transportation Needs:   . Film/video editor (Medical):   Marland Kitchen Lack of Transportation (Non-Medical):   Physical Activity:   . Days of Exercise per Week:   . Minutes of Exercise per Session:   Stress:   . Feeling of Stress :   Social Connections:   . Frequency of Communication with Friends and Family:   . Frequency of Social Gatherings with Friends and Family:   . Attends Religious Services:   . Active Member of Clubs or Organizations:   . Attends Archivist Meetings:   Marland Kitchen Marital Status:   Intimate Partner Violence:   . Fear of Current or Ex-Partner:   . Emotionally Abused:   Marland Kitchen Physically Abused:   . Sexually Abused:      Family History  Problem Relation Age of Onset  . Hypertension Mother   . Mitral valve prolapse Mother   . Diabetes Father   . Arthritis Other   . Hyperlipidemia Other   . Cancer Neg Hx   . Heart disease Neg Hx   . Kidney disease Neg Hx   . Stroke Neg Hx     ROS: [x]  Positive   [ ]  Negative   [ ]  All sytems reviewed and are negative  Cardiovascular: []  chest pain/pressure []  palpitations []  SOB lying flat []  DOE []  pain in legs while walking []  pain in legs at rest []  pain in legs at night []  non-healing ulcers []  hx of DVT []  swelling in legs  Pulmonary: []  productive cough []  asthma/wheezing []  home O2  Neurologic: []  weakness in []  arms []  legs []  numbness in []  arms []  legs []  hx of CVA []  mini stroke [] difficulty speaking or slurred speech []  temporary loss of vision in one eye []  dizziness  Hematologic: []  hx of cancer []  bleeding problems []  problems with blood clotting easily  Endocrine:   []  diabetes []  thyroid disease  GI []  vomiting blood []  blood in stool  GU: []  CKD/renal failure []  HD--[]  M/W/F or []  T/T/S []  burning with urination []  blood in urine  Psychiatric: []  anxiety []   depression  Musculoskeletal: []  arthritis []  joint pain  Integumentary: []  rashes []  ulcers  Constitutional: []  fever []  chills   Physical Examination  Vitals:   08/18/19 0836  BP: (!) 145/86  Pulse: 78  Resp: 15  Temp: 98.3 F (36.8 C)  SpO2: 100%   Body mass index is 44.93 kg/m.  General:  WDWN in NAD Gait: Not observed HENT: WNL, normocephalic Pulmonary: normal non-labored breathing, without Rales, rhonchi,  wheezing Cardiac: regular, without  Murmurs, rubs or gallops Vascular Exam/Pulses: Left upper arm AV fistula with excellent thrill and 2 tandem aneurysms but the skin is intact.  Palpable radial pulse at the wrist.   CBC    Component Value Date/Time   WBC 8.3 04/04/2019 1127   RBC 3.18 (L) 04/04/2019 1127   HGB 12.6 08/18/2019 0906   HCT 37.0 08/18/2019 0906   PLT 333.0 04/04/2019 1127   MCV 99.4 04/04/2019 1127   MCH 31.6 03/14/2019 0544   MCHC 32.3 04/04/2019 1127   RDW 18.4 (H) 04/04/2019 1127   LYMPHSABS 1.2 04/04/2019 1127   MONOABS 1.1 (H) 04/04/2019 1127   EOSABS 0.3 04/04/2019 1127   BASOSABS 0.0 04/04/2019 1127    BMET    Component Value Date/Time   NA 136 08/18/2019 0906   NA 141 12/26/2018 1022   K 4.4 08/18/2019 0906   CL 94 (L) 08/18/2019 0906   CO2 27 04/04/2019 1127   GLUCOSE 122 (H) 08/18/2019 0906   BUN 26 (H) 08/18/2019 0906   BUN 64 (H) 12/26/2018 1022   CREATININE 6.80 (H) 08/18/2019 0906   CREATININE 2.28 (H) 03/21/2012 0952   CALCIUM 7.5 (L) 04/04/2019 1127   GFRNONAA 3 (L) 03/14/2019 0544   GFRAA 3 (L) 03/14/2019 0544    COAGS: Lab Results  Component Value Date   INR 1.0 05/27/2018   INR 1.3 (H) 04/21/2018   INR 1.0 11/26/2017     Non-Invasive Vascular Imaging:    None   ASSESSMENT/PLAN: This is a 54 y.o. female with end-stage renal disease who presents for evaluation of malfunctioning left arm fistula due to high venous pressures that has been malfunctioning since June.  Risk benefits discussed plan  to proceed with fistulogram.     Marty Heck, MD [facility]: [phone]

## 2019-08-21 ENCOUNTER — Encounter (HOSPITAL_COMMUNITY): Payer: Self-pay | Admitting: Vascular Surgery

## 2019-08-29 NOTE — Progress Notes (Deleted)
Cardiology Clinic Note   Patient Name: Brenda Davis Date of Encounter: 08/29/2019  Primary Care Provider:  [name], MD Primary Cardiologist:  Brenda Latch, MD  Patient Profile    Brenda Davis 54 year old female presents today for a follow-up of her coronary artery disease, CHF, and CAD.  Cardiac event monitor not received.  Past Medical History    Past Medical History:  Diagnosis Date  . Anemia   . CAD (coronary artery disease)    a. cath in 02/2018 showing moderate 3-vessel CAD with 45% mid-LADm 55% LCx and 65% RCA stenosis which was not significant by FFR  . ESRD on dialysis ([place])   . Gout   . HCAP (healthcare-associated pneumonia) 05/06/2016  . History of blood transfusion    "related to surgery"  . Hyperlipidemia   . Hypertension   . Morbid obesity ([place])   . Pain    LEFT SHOULDER PAIN - WAS SEEN AT AN URGENT CARE - GIVEN SLING FOR COMFORT AND TOLD ROM AS TOLERATED.  Marland Kitchen Palpitations 09/24/2016  . Peritonitis, dialysis-associated ([place])   . Type II diabetes mellitus ([place])    "gastric sleeve OR corrected this" (05/06/2016)   Past Surgical History:  Procedure Laterality Date  . AV FISTULA PLACEMENT Left 01/03/2015   Procedure: BRACHIAL CEPHALIC ARTERIOVENOUS  FISTULA CREATION LEFT ARM;  Surgeon: Angelia Mould, MD;  Location: [place];  Service: Vascular;  Laterality: Left;  . CARPAL TUNNEL RELEASE Right   . [place]  . CHOLECYSTECTOMY  09/14/2017  . CORONARY STENT INTERVENTION N/A 01/17/2019   Procedure: CORONARY STENT INTERVENTION;  Surgeon: Brenda Booze, MD;  Location: [place] [facility];  Service: Cardiovascular;  Laterality: N/A;  . ESOPHAGOGASTRODUODENOSCOPY N/A 09/04/2012   Procedure: ESOPHAGOGASTRODUODENOSCOPY (EGD);  Surgeon: Brenda Medal, MD;  Location: [facility];  Service: General;  Laterality: N/A;  PF  . ESOPHAGOGASTRODUODENOSCOPY (EGD) WITH ESOPHAGEAL DILATION N/A 09/29/2012    Procedure: ESOPHAGOGASTRODUODENOSCOPY (EGD) WITH ESOPHAGEAL DILATION;  Surgeon: Brenda Banister, MD;  Location: [facility];  Service: Endoscopy;  Laterality: N/A;  . FEMORAL ARTERY EXPLORATION Right 01/17/2019   Procedure: Exploration Right Groin with Primary Closure or Arteriotomy Site; Washout of Retroperitoneal Hematoma;  Surgeon: Brenda Sandy, MD;  Location: [place];  Service: Vascular;  Laterality: Right;  . INSERTION OF DIALYSIS CATHETER N/A 01/03/2015   Procedure: INSERTION OF DIALYSIS CATHETER RIGHT INTERNAL JUGULAR;  Surgeon: Angelia Mould, MD;  Location: [place];  Service: Vascular;  Laterality: N/A;  . LAPAROSCOPIC GASTRIC SLEEVE RESECTION N/A 07/19/2012   Procedure: LAPAROSCOPIC SLEEVE GASTRECTOMY with EGD;  Surgeon: Brenda Hook, DO;  Location: [facility];  Service: General;  Laterality: N/A;  laparoscopic sleeve gastrectomy with EGD  . LEFT HEART CATH AND CORONARY ANGIOGRAPHY N/A 02/25/2018   Procedure: LEFT HEART CATH AND CORONARY ANGIOGRAPHY;  Surgeon: Brenda Man, MD;  Location: [place] [facility];  Service: Cardiovascular;  Laterality: N/A;  . LEFT HEART CATH AND CORONARY ANGIOGRAPHY N/A 06/20/2018   Procedure: LEFT HEART CATH AND CORONARY ANGIOGRAPHY;  Surgeon: Brenda Bush, MD;  Location: [place] [facility];  Service: Cardiovascular;  Laterality: N/A;  . LEFT HEART CATH AND CORONARY ANGIOGRAPHY N/A 01/16/2019   Procedure: LEFT HEART CATH AND CORONARY ANGIOGRAPHY;  Surgeon: Brenda Booze, MD;  Location: [place] [facility];  Service: Cardiovascular;  Laterality: N/A;  . LEFT HEART CATH AND CORONARY ANGIOGRAPHY N/A 03/03/2019   Procedure: LEFT HEART CATH AND CORONARY ANGIOGRAPHY;  Surgeon:  Brenda Booze, MD;  Location: [place] [facility];  Service: Cardiovascular;  Laterality: N/A;  . PERIPHERAL VASCULAR BALLOON ANGIOPLASTY Left 08/18/2019   Procedure: PERIPHERAL VASCULAR BALLOON ANGIOPLASTY;  Surgeon: Brenda Heck, MD;  Location: [facility];  Service: Cardiovascular;  Laterality: Left;  arm fistula  . PERIPHERAL VASCULAR CATHETERIZATION Left 05/23/2015   Procedure: Brenda Davis;  Surgeon: Brenda Cochran, MD;  Location: [place] [facility];  Service: Cardiovascular;  Laterality: Left;  upper aRM  . PERIPHERAL VASCULAR CATHETERIZATION Left 05/23/2015   Procedure: Peripheral Vascular Balloon Angioplasty;  Surgeon: Brenda Sanford, MD;  Location: [facility];  Service: Cardiovascular;  Laterality: Left;  av fistula  . TUBAL LIGATION  1993  . UPPER GI ENDOSCOPY N/A 07/19/2012   Procedure: UPPER GI ENDOSCOPY;  Surgeon: Brenda Hook, DO;  Location: [facility];  Service: General;  Laterality: N/A;    Allergies  Allergies  Allergen Reactions  . Amlodipine Swelling  . Atorvastatin     Elevated LFT's  . Clonidine Derivatives Swelling    Limbs swell  . Doxycycline Nausea And Vomiting    "I threw up for 3 hours"  . Welchol [Colesevelam Hcl] Nausea Only    History of Present Illness    Ms. Brenda Davis has a past medical history of NSTEMI with cardiac catheterization on 01/17/2019.  She had PCI with DES x2 to the proximalRCA and first marginal.  Her mid LAD was also 75% stenosed, this will be managed medically.  She also had 40% mid LAD, mid circumflex and distal circumflex lesions 55 and 40% stenosed.  She also has dyslipidemia, morbid obesity, history of acute respiratory failure with hypoxemia, HTN, ESRD on PD and history of hyperkalemia.  She presented to the hospital emergency department on 01/15/2019 for an evaluation of her chest pain.  She had reported that for the prior 2 weeks she been having fatigue.  Over that Thursday and Friday she also noticed decrease exercise tolerance her chest pain was nitro responsive.  She noticed that her chest pain was absent on Saturday and reappeared on Sunday.  She stated that her chest pain further progressed to substernal and left-sided chest pain with a stabbing type sensation.  She also had  associated nausea diaphoresis, and shortness of breath.  She again took nitroglycerin x1 and her pain became worse.  She took another nitroglycerin and her pain continued to be 10/10.  Her husband called 108, she took 4 baby aspirin and a third nitroglycerin which helped her pain decrease.  Her presentation was similar to her presentation 06/2018.  Her LHC at that time showed no clear culprit lesions and she did not tolerate isosorbide.  She felt that it made her chest pain worse, she also developed diaphoresis with the medication.  She presented to the clinic 02/01/19 and stated she was not able to tolerate her metoprolol tartrate due to low blood pressures.  She also decreased the amount of fluid she pulls with each dialysis session to 500 mL nightly.  Her blood pressure was 88/64.  She stated she had been sedentary since her cardiac catheterization due to her incision with staples.  She did light walking in her house and some housework.  She had questions about her diet and her husband stated they have been eating packaged/processed foods such as canned spaghetti O's.  She had an appointment with nephrology on 02/02/2019.  I  gave him salty 6 she, refilled her Brilinta and Crestor, and asked her  to increase her physical activity as tolerated.  She subsequently developed an ulcer and presented back to the emergency department on 03/11/2019 with unstable angina.  She is found to have a stent thrombosis of her OM stent.  It was not amenable to PCI medical management was recommended.  During her hospitalization she developed Covid pneumonia and sepsis.  She initially tested negative for Covid but subsequently tested positive.  Her husband also tested positive and therefore it was not found to be nosocomial.  She was treated with remdesivir and steroids.  She also had blood cultures that were positive for staph capitis which she received vancomycin and cefepime for.  She was transitioned to doxycycline.  She was  last seen by Jory Sims, DNP on 08/02/2019.  She was found to be anxious about her heart rate and blood pressure at that time.  She noted that 2 days prior awake her heart rate had been up to 103-110-she had been sitting on the couch.  She described nauseousness and feelings of indigestion.  She did have some diaphoresis.  She indicated that she got up from the couch and walked to her bedroom.  She noted dyspnea along with near syncope.  Her husband took her blood pressure and found it to be 189/102 with a heart rate of 108.  She retook her blood pressure 2 to 3 minutes later and noticed it was slowly coming down heart rate increased to 98 bpm.  She indicated that she has had another episode of tachycardia and hypertension while at dialysis.  When she was seen by Jory Sims, DNP in follow-up she indicated that she had been compliant with her medicines had not had any recurrent episodes of rapid heart rate or high blood pressure and denied bleeding, bruising, and myalgia.  Her nephrologist Dr. Ronnald Collum removed her PD catheter due to frequent infections and she was placed in her left brachial AV fistula for HD which was working well.  She was being trained on home dialysis at that time.  She presents the clinic today for follow-up evaluation and states***  She denies chest pain, shortness of breath, lower extremity edema, fatigue, palpitations, melena, hematuria, hemoptysis, diaphoresis, weakness, syncope, orthopnea, and PND.  Home Medications    Prior to Admission medications   Medication Sig Start Date End Date Taking? Authorizing Provider  acetaminophen (TYLENOL) 500 MG tablet Take 1,000 mg by mouth every 6 (six) hours as needed for moderate pain or headache.    [provider]  aspirin EC 81 MG EC tablet Take 1 tablet (81 mg total) by mouth daily. 02/26/18   Norval Morton, MD  calcitRIOL (ROCALTROL) 0.5 MCG capsule Take 1.5 mcg by mouth daily.  04/29/19   [provider]   calcium acetate (PHOSLO) 667 MG capsule Take 667-2,001 mg by mouth See admin instructions. Take 2001 mg with each meal and 667 mg with each snack 06/02/18   [provider]  carvedilol (COREG) 3.125 MG tablet Take 3.125 mg by mouth 2 (two) times daily as needed (high bp).  07/31/19   [provider]  ferric citrate (AURYXIA) 1 GM 210 MG(Fe) tablet Take 2 tablets (420 mg total) by mouth 3 (three) times daily with meals. 01/23/19   Daune Perch, NP  folic acid (FOLVITE) 1 MG tablet Take 1 tablet (1 mg total) by mouth daily. Patient not taking: Reported on 08/15/2019 03/08/18   Brenda Lima, MD  gabapentin (NEURONTIN) 100 MG capsule Take 100 mg by mouth 2 (  two) times daily. 10/31/18   [provider]  isosorbide dinitrate (ISORDIL) 10 MG tablet Take 1 tablet (10 mg total) by mouth 2 (two) times daily. 04/05/19   Brenda Lima, MD  midodrine (PROAMATINE) 5 MG tablet Take 1 tablet (5 mg total) by mouth 3 (three) times daily with meals. Patient taking differently: Take 5 mg by mouth 2 (two) times daily as needed (low bp).  05/02/19   Brenda Latch, MD  nitroGLYCERIN (NITROSTAT) 0.4 MG SL tablet TAKE 1 TABLET BY MOUTH UNDER THE TONGUE EVERY 5 MINUTES AS NEEDED FOR CHEST PAIN Patient taking differently: Place 0.4 mg under the tongue every 5 (five) minutes as needed for chest pain.  03/24/19   Brenda Latch, MD  rosuvastatin (CRESTOR) 10 MG tablet Take 10 mg by mouth daily.    [provider]  rosuvastatin (CRESTOR) 20 MG tablet Take 1 tablet (20 mg total) by mouth daily. 05/02/19 08/18/19  Brenda Latch, MD  ticagrelor (BRILINTA) 90 MG TABS tablet Take 1 tablet (90 mg total) by mouth 2 (two) times daily. 02/01/19 01/27/20  Deberah Pelton, NP    Family History    Family History  Problem Relation Age of Onset  . Hypertension Mother   . Mitral valve prolapse Mother   . Diabetes Father   . Arthritis Other   . Hyperlipidemia Other   . Cancer Neg Hx   . Heart  disease Neg Hx   . Kidney disease Neg Hx   . Stroke Neg Hx    She indicated that her mother is alive. She indicated that her father is deceased. She indicated that the status of her neg hx is unknown. She indicated that the status of her other is unknown.  Social History    Social History   Socioeconomic History  . Marital status: Married    Spouse name: Not on file  . Number of children: 3  . Years of education: Not on file  . Highest education level: Not on file  Occupational History  . Occupation: Research scientist (physical sciences): [employer]  Tobacco Use  . Smoking status: Former Smoker    Packs/day: 1.00    Years: 16.00    Pack years: 16.00    Types: Cigarettes    Quit date: 2009    Years since quitting: 12.6  . Smokeless tobacco: Never Used  Vaping Use  . Vaping Use: Never used  Substance and Sexual Activity  . Alcohol use: Never  . Drug use: No  . Sexual activity: Yes    Birth control/protection: Post-menopausal  Other Topics Concern  . Not on file  Social History Narrative  . Not on file   Social Determinants of Health   Financial Resource Strain:   . Difficulty of Paying Living Expenses:   Food Insecurity:   . Worried About Charity fundraiser in the Last Year:   . Arboriculturist in the Last Year:   Transportation Needs:   . Film/video editor (Medical):   Marland Kitchen Lack of Transportation (Non-Medical):   Physical Activity:   . Days of Exercise per Week:   . Minutes of Exercise per Session:   Stress:   . Feeling of Stress :   Social Connections:   . Frequency of Communication with Friends and Family:   . Frequency of Social Gatherings with Friends and Family:   . Attends Religious Services:   . Active Member of Clubs or Organizations:   . Attends Club  or Organization Meetings:   Marland Kitchen Marital Status:   Intimate Partner Violence:   . Fear of Current or Ex-Partner:   . Emotionally Abused:   Marland Kitchen Physically Abused:   . Sexually Abused:      Review of Systems      General:  No chills, fever, night sweats or weight changes.  Cardiovascular:  No chest pain, dyspnea on exertion, edema, orthopnea, palpitations, paroxysmal nocturnal dyspnea. Dermatological: No rash, lesions/masses Respiratory: No cough, dyspnea Urologic: No hematuria, dysuria Abdominal:   No nausea, vomiting, diarrhea, bright red blood per rectum, melena, or hematemesis Neurologic:  No visual changes, wkns, changes in mental status. All other systems reviewed and are otherwise negative except as noted above.  Physical Exam    VS:  There were no vitals taken for this visit. , BMI There is no height or weight on file to calculate BMI. GEN: Well nourished, well developed, in no acute distress. HEENT: normal. Neck: Supple, no JVD, carotid bruits, or masses. Cardiac: RRR, no murmurs, rubs, or gallops. No clubbing, cyanosis, edema.  Radials/DP/PT 2+ and equal bilaterally.  Respiratory:  Respirations regular and unlabored, clear to auscultation bilaterally. GI: Soft, nontender, nondistended, BS + x 4. MS: no deformity or atrophy. Skin: warm and dry, no rash. Neuro:  Strength and sensation are intact. Psych: Normal affect.  Accessory Clinical Findings    Recent Labs: 03/14/2019: ALT 14 04/04/2019: Magnesium 1.9; Platelets 333.0 08/18/2019: BUN 26; Creatinine, Ser 6.80; Hemoglobin 12.6; Potassium 4.4; Sodium 136   Recent Lipid Panel    Component Value Date/Time   CHOL 185 01/16/2019 0449   CHOL 191 12/26/2018 1022   TRIG 150 (H) 01/16/2019 0449   HDL 39 (L) 01/16/2019 0449   HDL 44 12/26/2018 1022   CHOLHDL 4.7 01/16/2019 0449   VLDL 30 01/16/2019 0449   LDLCALC 116 (H) 01/16/2019 0449   LDLCALC 109 (H) 12/26/2018 1022   LDLDIRECT 161.6 11/28/2012 1042    ECG personally reviewed by me today- *** - No acute changes  Echocardiogram 01/04/2015 Study Conclusions  - Left ventricle: The cavity size was normal. Wall thickness was normal. Systolic function was vigorous. The  estimated ejection fraction was in the range of 65% to 70%. Features are consistent with a pseudonormal left ventricular filling pattern, with concomitant abnormal relaxation and increased filling pressure (grade 2 diastolic dysfunction). - Mitral valve: There was mild regurgitation. - Pericardium, extracardiac: A trivial pericardial effusion was identified.   48 Hour Holter Monitor 10/08/2016:  Quality: Fair. Baseline artifact. Predominant rhythm: sinus rhythm Average heart rate: 64 bpm Max heart rate: 94 bpm Min heart rate: 50 bpm  Occasional PACs were noted that did not correspond with the fluttering reported by the patient.  LHC 06/20/18: Conclusions: 1. Moderately severe multivessel coronary artery disease not significantly changed since prior catheterization in 02/2018. DFR of the most severe lesion in the RCA at that time was not hemodynamically significant. 2. Normal left ventricular filling pressure. 3. Hyperdynamic left ventricular contraction.  Recommendations: 1. Medical therapy for moderate diffuse coronary artery disease. There is no clear culprit for PCI. I suspect troponin elevation reflects supply-demand mismatch. I will start isosorbide mononitrate 30 mg daily, which can be escalated as blood pressure tolerates for relief of chest pain. 2. Aggressive secondary prevention, including high-intensity statin therapy. 3. If no evidence of bleeding complications and no recurrence of chest pain, discharge this afternoon or tomorrow could be considered from a cardiac standpoint.   Cardiac catheterization 01/17/2019  Mid LAD lesion  is 75% stenosed. The LAD was not addressed and will be managed medically.  Ost LAD to Mid LAD lesion is 40% stenosed.  Mid Cx to Dist Cx lesion is 55% stenosed with 40% stenosed side branch in Ost 3rd Mrg.  Prox RCA lesion is 75% stenosed.  A drug-eluting stent was successfully placed using a SYNERGY XD 3.0X38,  postdilated to 3.5 mm.  Post intervention, there is a 0% residual stenosis.  Ost 1st Mrg lesion is 99% stenosed. EBU4 Guide catheter used.  A drug-eluting stent was successfully placed using a SYNERGY XD 2.25X24, postdilated to 2.75 mm.  Post intervention, there is a 0% residual stenosis.  Continue DAPT for 12 months. Would recommend clopidogrel long-term after one year since she has such diffuse disease. Continue aggressive secondary prevention.  Cardiac Cath 03/03/19  Mid LAD lesion is 75% stenosed.  Ost LAD to Mid LAD lesion is 40% stenosed.  Previously placed Ost 1st Mrg drug eluting stent is widely patent.  Mid Cx to Dist Cx lesion is 55% stenosed with 40% stenosed side branch in Ost 3rd Mrg.  Previously placed Prox RCA drug eluting stent is widely patent.  Ost 1st Mrg to 1st Mrg lesion is 99% stenosed. Partially in the stent and partially distal to the stent- subacte stent thrombosis.  LV end diastolic pressure is normal.  There is no aortic valve stenosis. Medical therapy for stent thrombosis of the OM stent. Given the severe tortuosity, I don't think repeat PCI attempt would be beneficial. There was significant difficulty getting a stent delivered during the last cath. I think there would be a high likelihood of completely occluding the vessel with wire manipulation.  Echocardiogram 01/16/2019 IMPRESSIONS 1. Left ventricular ejection fraction, by visual estimation, is 65 to 70%. The left ventricle has hyperdynamic function. There is moderately increased left ventricular hypertrophy. 2. Definity contrast agent was given IV to delineate the left ventricular endocardial borders. 3. Left ventricular diastolic parameters are consistent with Grade I diastolic dysfunction (impaired relaxation). 4. The left ventricle has no regional wall motion abnormalities. 5. Global right ventricle has normal systolic function.The right ventricular size is mildly enlarged. No  increase in right ventricular wall thickness. 6. Left atrial size was normal. 7. Right atrial size was normal. 8. The pericardial effusion is posterior to the left ventricle. 9. Trivial pericardial effusion is present. 10. The mitral valve was not well visualized. Trivial mitral valve regurgitation. 11. The tricuspid valve is not well visualized. 12. The aortic valve is tricuspid. Aortic valve regurgitation is not visualized. Mild aortic valve sclerosis without stenosis. 13. The pulmonic valve was grossly normal. Pulmonic valve regurgitation is not visualized. 14. The inferior vena cava is normal in size with <50% respiratory variability, suggesting right atrial pressure of 8 mmHg. 15. The interatrial septum was not well visualized.   Assessment & Plan   1.  Tachycardia intermittent-heart rate today***.  14-day ZIO monitor not yet resulted.  Felt to be related to obesity secondary to OSA.  Plan to transition to a low nodal blocking agent pending ZIO monitor results. Continue carvedilol Heart healthy low-sodium diet-salty 6 given Increase physical activity as tolerated Avoid triggers caffeine, chocolate, EtOH etc.  Essential hypertension-BP today***.  Well-controlled at home.  On midodrine as needed with HD. Continue carvedilol, Imdur Heart healthy low-sodium diet-salty 6 given Increase physical activity as tolerated  Coronary artery disease-no chest pain today.  Status post PCI with DES x2 to proximal RCA and OM.  LAD 70% medically managed.  Underwent  repeat LHC 03/11/2019 and found to have in-stent thrombosis of OM stent.  This was not amenable to PCI medical management was recommended. Continue ASA, Brilinta, carvedilol, Heart healthy low-sodium diet-salty 6 given Increase physical activity as tolerated  Hyperlipidemia-01/16/2019: Cholesterol 185; HDL 39; LDL Cholesterol 116; Triglycerides 150; VLDL 30 Continue rosuvastatin Heart healthy low-sodium high-fiber diet Increase  physical activity as tolerated Repeat lipid panel and LFTs   Disposition: Follow-up with me or Dr. Oval Linsey in 1 month.  Jossie Ng. Winson Eichorn NP-C    08/29/2019, 7:43 AM [address] Office [phone] Fax [phone]  Notice: This dictation was prepared with Dragon dictation along with smaller phrase technology. Any transcriptional errors that result from this process are unintentional and may not be corrected upon review.

## 2019-08-30 ENCOUNTER — Telehealth: Payer: Self-pay

## 2019-08-30 ENCOUNTER — Ambulatory Visit: Payer: BC Managed Care – PPO | Admitting: General Practice

## 2019-08-30 NOTE — Telephone Encounter (Signed)
Patients monitor results are not back yet, due to this the patient is rescheduled to see Coletta Memos NP on August 31st at 9:15am. Patient verbalized understanding.

## 2019-09-06 ENCOUNTER — Other Ambulatory Visit: Payer: Self-pay | Admitting: Internal Medicine

## 2019-09-06 DIAGNOSIS — I5032 Chronic diastolic (congestive) heart failure: Secondary | ICD-10-CM

## 2019-09-18 NOTE — Progress Notes (Deleted)
Cardiology Clinic Note   Patient Name: Brenda Davis Date of Encounter: 09/18/2019  Primary Care Provider:  [name], MD Primary Cardiologist:  Skeet Latch, MD  Patient Profile    Brenda Davis 54 year old female presents today for a follow-up of her coronary artery disease, CHF, and CAD.  Past Medical History    Past Medical History:  Diagnosis Date  . Anemia   . CAD (coronary artery disease)    a. cath in 02/2018 showing moderate 3-vessel CAD with 45% mid-LADm 55% LCx and 65% RCA stenosis which was not significant by FFR  . ESRD on dialysis ([place])   . Gout   . HCAP (healthcare-associated pneumonia) 05/06/2016  . History of blood transfusion    "related to surgery"  . Hyperlipidemia   . Hypertension   . Morbid obesity ([place])   . Pain    LEFT SHOULDER PAIN - WAS SEEN AT AN URGENT CARE - GIVEN SLING FOR COMFORT AND TOLD ROM AS TOLERATED.  Marland Kitchen Palpitations 09/24/2016  . Peritonitis, dialysis-associated ([place])   . Type II diabetes mellitus ([place])    "gastric sleeve OR corrected this" (05/06/2016)   Past Surgical History:  Procedure Laterality Date  . AV FISTULA PLACEMENT Left 01/03/2015   Procedure: BRACHIAL CEPHALIC ARTERIOVENOUS  FISTULA CREATION LEFT ARM;  Surgeon: Angelia Mould, MD;  Location: [facility];  Service: Vascular;  Laterality: Left;  . CARPAL TUNNEL RELEASE Right   . [place]  . CHOLECYSTECTOMY  09/14/2017  . CORONARY STENT INTERVENTION N/A 01/17/2019   Procedure: CORONARY STENT INTERVENTION;  Surgeon: Jettie Booze, MD;  Location: [facility];  Service: Cardiovascular;  Laterality: N/A;  . ESOPHAGOGASTRODUODENOSCOPY N/A 09/04/2012   Procedure: ESOPHAGOGASTRODUODENOSCOPY (EGD);  Surgeon: Shann Medal, MD;  Location: [facility];  Service: General;  Laterality: N/A;  PF  . ESOPHAGOGASTRODUODENOSCOPY (EGD) WITH ESOPHAGEAL DILATION N/A 09/29/2012   Procedure: ESOPHAGOGASTRODUODENOSCOPY (EGD)  WITH ESOPHAGEAL DILATION;  Surgeon: Milus Banister, MD;  Location: [facility];  Service: Endoscopy;  Laterality: N/A;  . FEMORAL ARTERY EXPLORATION Right 01/17/2019   Procedure: Exploration Right Groin with Primary Closure or Arteriotomy Site; Washout of Retroperitoneal Hematoma;  Surgeon: Waynetta Sandy, MD;  Location: [facility];  Service: Vascular;  Laterality: Right;  . INSERTION OF DIALYSIS CATHETER N/A 01/03/2015   Procedure: INSERTION OF DIALYSIS CATHETER RIGHT INTERNAL JUGULAR;  Surgeon: Angelia Mould, MD;  Location: [facility];  Service: Vascular;  Laterality: N/A;  . LAPAROSCOPIC GASTRIC SLEEVE RESECTION N/A 07/19/2012   Procedure: LAPAROSCOPIC SLEEVE GASTRECTOMY with EGD;  Surgeon: Madilyn Hook, DO;  [facility]: [facility];  Service: General;  Laterality: N/A;  laparoscopic sleeve gastrectomy with EGD  . LEFT HEART CATH AND CORONARY ANGIOGRAPHY N/A 02/25/2018   Procedure: LEFT HEART CATH AND CORONARY ANGIOGRAPHY;  Surgeon: Leonie Man, MD;  Location: [facility];  Service: Cardiovascular;  Laterality: N/A;  . LEFT HEART CATH AND CORONARY ANGIOGRAPHY N/A 06/20/2018   Procedure: LEFT HEART CATH AND CORONARY ANGIOGRAPHY;  Surgeon: Nelva Bush, MD;  Location: [facility];  Service: Cardiovascular;  Laterality: N/A;  . LEFT HEART CATH AND CORONARY ANGIOGRAPHY N/A 01/16/2019   Procedure: LEFT HEART CATH AND CORONARY ANGIOGRAPHY;  Surgeon: Jettie Booze, MD;  Location: [facility];  Service: Cardiovascular;  Laterality: N/A;  . LEFT HEART CATH AND CORONARY ANGIOGRAPHY N/A 03/03/2019   Procedure: LEFT HEART CATH AND CORONARY ANGIOGRAPHY;  Surgeon: Jettie Booze, MD;  Location:  [facility];  Service: Cardiovascular;  Laterality: N/A;  . PERIPHERAL VASCULAR BALLOON ANGIOPLASTY Left 08/18/2019   Procedure: PERIPHERAL VASCULAR BALLOON ANGIOPLASTY;  Surgeon: Marty Heck, MD;  Location: [facility];  Service: Cardiovascular;  Laterality:  Left;  arm fistula  . PERIPHERAL VASCULAR CATHETERIZATION Left 05/23/2015   Procedure: Nolon Stalls;  Surgeon: Conrad Yankee Hill, MD;  Location: [facility];  Service: Cardiovascular;  Laterality: Left;  upper aRM  . PERIPHERAL VASCULAR CATHETERIZATION Left 05/23/2015   Procedure: Peripheral Vascular Balloon Angioplasty;  Surgeon: Conrad Akron, MD;  Location: [facility];  Service: Cardiovascular;  Laterality: Left;  av fistula  . TUBAL LIGATION  1993  . UPPER GI ENDOSCOPY N/A 07/19/2012   Procedure: UPPER GI ENDOSCOPY;  Surgeon: Madilyn Hook, DO;  Location: [facility];  Service: General;  Laterality: N/A;    Allergies  Allergies  Allergen Reactions  . Amlodipine Swelling  . Atorvastatin     Elevated LFT's  . Clonidine Derivatives Swelling    Limbs swell  . Doxycycline Nausea And Vomiting    "I threw up for 3 hours"  . Welchol [Colesevelam Hcl] Nausea Only    History of Present Illness    Ms. Brenda Davis has a past medical history of NSTEMI with cardiac catheterization on 01/17/2019.  She had PCI with DES x2 to the proximalRCA and first marginal.  Her mid LAD was also 75% stenosed, this will be managed medically.  She also had 40% mid LAD, mid circumflex and distal circumflex lesions 55 and 40% stenosed.  She also has dyslipidemia, morbid obesity, history of acute respiratory failure with hypoxemia, HTN, ESRD on PD and history of hyperkalemia.  She presented to the hospital emergency department on 01/15/2019 for an evaluation of her chest pain.  She had reported that for the prior 2 weeks she been having fatigue.  Over that Thursday and Friday she also noticed decrease exercise tolerance her chest pain was nitro responsive.  She noticed that her chest pain was absent on Saturday and reappeared on Sunday.  She stated that her chest pain further progressed to substernal and left-sided chest pain with a stabbing type sensation.  She also had associated nausea diaphoresis, and shortness  of breath.  She again took nitroglycerin x1 and her pain became worse.  She took another nitroglycerin and her pain continued to be 10/10.  Her husband called 7, she took 4 baby aspirin and a third nitroglycerin which helped her pain decrease.  Her presentation was similar to her presentation 06/2018.  Her LHC at that time showed no clear culprit lesions and she did not tolerate isosorbide.  She felt that it made her chest pain worse, she also developed diaphoresis with the medication.  She presented to the clinic 02/01/19 and stated she was not able to tolerate her metoprolol tartrate due to low blood pressures.  She also decreased the amount of fluid she pulls with each dialysis session to 500 mL nightly.  Her blood pressure was 88/64.  She stated she had been sedentary since her cardiac catheterization due to her incision with staples.  She did light walking in her house and some housework.  She had questions about her diet and her husband stated they have been eating packaged/processed foods such as canned spaghetti O's.  She had an appointment with nephrology on 02/02/2019.  I  gave him salty 6 she, refilled her Brilinta and Crestor, and asked her to increase her physical activity as  tolerated.  She subsequently developed an ulcer and presented back to the emergency department on 03/11/2019 with unstable angina.  She is found to have a stent thrombosis of her OM stent.  It was not amenable to PCI medical management was recommended.  During her hospitalization she developed Covid pneumonia and sepsis.  She initially tested negative for Covid but subsequently tested positive.  Her husband also tested positive and therefore it was not found to be nosocomial.  She was treated with remdesivir and steroids.  She also had blood cultures that were positive for staph capitis which she received vancomycin and cefepime for.  She was transitioned to doxycycline.  She was last seen by Jory Sims, DNP on  08/02/2019.  She was found to be anxious about her heart rate and blood pressure at that time.  She noted that 2 days prior awake her heart rate had been up to 103-110-she had been sitting on the couch.  She described nauseousness and feelings of indigestion.  She did have some diaphoresis.  She indicated that she got up from the couch and walked to her bedroom.  She noted dyspnea along with near syncope.  Her husband took her blood pressure and found it to be 189/102 with a heart rate of 108.  She retook her blood pressure 2 to 3 minutes later and noticed it was slowly coming down heart rate increased to 98 bpm.  She indicated that she has had another episode of tachycardia and hypertension while at dialysis.  When she was seen by Jory Sims, DNP in follow-up she indicated that she had been compliant with her medicines had not had any recurrent episodes of rapid heart rate or high blood pressure and denied bleeding, bruising, and myalgia.  Her nephrologist Dr. Ronnald Collum removed her PD catheter due to frequent infections and she was placed in her left brachial AV fistula for HD which was working well.  She was being trained on home dialysis at that time.  She presents the clinic today for follow-up evaluation and states***  She denies chest pain, shortness of breath, lower extremity edema, fatigue, palpitations, melena, hematuria, hemoptysis, diaphoresis, weakness, syncope, orthopnea, and PND.  Home Medications    Prior to Admission medications   Medication Sig Start Date End Date Taking? Authorizing Provider  acetaminophen (TYLENOL) 500 MG tablet Take 1,000 mg by mouth every 6 (six) hours as needed for moderate pain or headache.    [provider]  aspirin EC 81 MG EC tablet Take 1 tablet (81 mg total) by mouth daily. 02/26/18   Norval Morton, MD  calcitRIOL (ROCALTROL) 0.5 MCG capsule Take 1.5 mcg by mouth daily.  04/29/19   [provider]  calcium acetate (PHOSLO) 667 MG capsule  Take 667-2,001 mg by mouth See admin instructions. Take 2001 mg with each meal and 667 mg with each snack 06/02/18   [provider]  carvedilol (COREG) 3.125 MG tablet Take 3.125 mg by mouth 2 (two) times daily as needed (high bp).  07/31/19   [provider]  ferric citrate (AURYXIA) 1 GM 210 MG(Fe) tablet Take 2 tablets (420 mg total) by mouth 3 (three) times daily with meals. 01/23/19   Daune Perch, NP  folic acid (FOLVITE) 1 MG tablet Take 1 tablet (1 mg total) by mouth daily. Patient not taking: Reported on 08/15/2019 03/08/18   [name], MD  gabapentin (NEURONTIN) 100 MG capsule Take 100 mg by mouth 2 (two) times daily. 10/31/18  [provider]  isosorbide dinitrate (ISORDIL) 10 MG tablet TAKE 1 TABLET BY MOUTH TWICE A DAY 09/06/19   [name], MD  midodrine (PROAMATINE) 5 MG tablet Take 1 tablet (5 mg total) by mouth 3 (three) times daily with meals. Patient taking differently: Take 5 mg by mouth 2 (two) times daily as needed (low bp).  05/02/19   Skeet Latch, MD  nitroGLYCERIN (NITROSTAT) 0.4 MG SL tablet TAKE 1 TABLET BY MOUTH UNDER THE TONGUE EVERY 5 MINUTES AS NEEDED FOR CHEST PAIN Patient taking differently: Place 0.4 mg under the tongue every 5 (five) minutes as needed for chest pain.  03/24/19   Skeet Latch, MD  rosuvastatin (CRESTOR) 10 MG tablet Take 10 mg by mouth daily.    [provider]  rosuvastatin (CRESTOR) 20 MG tablet Take 1 tablet (20 mg total) by mouth daily. 05/02/19 08/18/19  Skeet Latch, MD  ticagrelor (BRILINTA) 90 MG TABS tablet Take 1 tablet (90 mg total) by mouth 2 (two) times daily. 02/01/19 01/27/20  Deberah Pelton, NP    Family History    Family History  Problem Relation Age of Onset  . Hypertension Mother   . Mitral valve prolapse Mother   . Diabetes Father   . Arthritis Other   . Hyperlipidemia Other   . Cancer Neg Hx   . Heart disease Neg Hx   . Kidney disease Neg Hx   . Stroke Neg Hx     She indicated that her mother is alive. She indicated that her father is deceased. She indicated that the status of her neg hx is unknown. She indicated that the status of her other is unknown.  Social History    Social History   Socioeconomic History  . Marital status: Married    Spouse name: Not on file  . Number of children: 3  . Years of education: Not on file  . Highest education level: Not on file  Occupational History  . Occupation: Research scientist (physical sciences): [employer]  Tobacco Use  . Smoking status: Former Smoker    Packs/day: 1.00    Years: 16.00    Pack years: 16.00    Types: Cigarettes    Quit date: 2009    Years since quitting: 12.6  . Smokeless tobacco: Never Used  Vaping Use  . Vaping Use: Never used  Substance and Sexual Activity  . Alcohol use: Never  . Drug use: No  . Sexual activity: Yes    Birth control/protection: Post-menopausal  Other Topics Concern  . Not on file  Social History Narrative  . Not on file   Social Determinants of Health   Financial Resource Strain:   . Difficulty of Paying Living Expenses: Not on file  Food Insecurity:   . Worried About Charity fundraiser in the Last Year: Not on file  . Ran Out of Food in the Last Year: Not on file  Transportation Needs:   . Lack of Transportation (Medical): Not on file  . Lack of Transportation (Non-Medical): Not on file  Physical Activity:   . Days of Exercise per Week: Not on file  . Minutes of Exercise per Session: Not on file  Stress:   . Feeling of Stress : Not on file  Social Connections:   . Frequency of Communication with Friends and Family: Not on file  . Frequency of Social Gatherings with Friends and Family: Not on file  . Attends Religious Services: Not on file  .  Active Member of Clubs or Organizations: Not on file  . Attends Archivist Meetings: Not on file  . Marital Status: Not on file  Intimate Partner Violence:   . Fear of Current or Ex-Partner: Not on  file  . Emotionally Abused: Not on file  . Physically Abused: Not on file  . Sexually Abused: Not on file     Review of Systems    General:  No chills, fever, night sweats or weight changes.  Cardiovascular:  No chest pain, dyspnea on exertion, edema, orthopnea, palpitations, paroxysmal nocturnal dyspnea. Dermatological: No rash, lesions/masses Respiratory: No cough, dyspnea Urologic: No hematuria, dysuria Abdominal:   No nausea, vomiting, diarrhea, bright red blood per rectum, melena, or hematemesis Neurologic:  No visual changes, wkns, changes in mental status. All other systems reviewed and are otherwise negative except as noted above.  Physical Exam    VS:  There were no vitals taken for this visit. , BMI There is no height or weight on file to calculate BMI. GEN: Well nourished, well developed, in no acute distress. HEENT: normal. Neck: Supple, no JVD, carotid bruits, or masses. Cardiac: RRR, no murmurs, rubs, or gallops. No clubbing, cyanosis, edema.  Radials/DP/PT 2+ and equal bilaterally.  Respiratory:  Respirations regular and unlabored, clear to auscultation bilaterally. GI: Soft, nontender, nondistended, BS + x 4. MS: no deformity or atrophy. Skin: warm and dry, no rash. Neuro:  Strength and sensation are intact. Psych: Normal affect.  Accessory Clinical Findings    Recent Labs: 03/14/2019: ALT 14 04/04/2019: Magnesium 1.9; Platelets 333.0 08/18/2019: BUN 26; Creatinine, Ser 6.80; Hemoglobin 12.6; Potassium 4.4; Sodium 136   Recent Lipid Panel    Component Value Date/Time   CHOL 185 01/16/2019 0449   CHOL 191 12/26/2018 1022   TRIG 150 (H) 01/16/2019 0449   HDL 39 (L) 01/16/2019 0449   HDL 44 12/26/2018 1022   CHOLHDL 4.7 01/16/2019 0449   VLDL 30 01/16/2019 0449   LDLCALC 116 (H) 01/16/2019 0449   LDLCALC 109 (H) 12/26/2018 1022   LDLDIRECT 161.6 11/28/2012 1042    ECG personally reviewed by me today- *** - No acute changes  Echocardiogram  01/04/2015 Study Conclusions  - Left ventricle: The cavity size was normal. Wall thickness was normal. Systolic function was vigorous. The estimated ejection fraction was in the range of 65% to 70%. Features are consistent with a pseudonormal left ventricular filling pattern, with concomitant abnormal relaxation and increased filling pressure (grade 2 diastolic dysfunction). - Mitral valve: There was mild regurgitation. - Pericardium, extracardiac: A trivial pericardial effusion was identified.   48 Hour Holter Monitor 10/08/2016:  Quality: Fair. Baseline artifact. Predominant rhythm: sinus rhythm Average heart rate: 64 bpm Max heart rate: 94 bpm Min heart rate: 50 bpm  Occasional PACs were noted that did not correspond with the fluttering reported by the patient.  LHC 06/20/18: Conclusions: 1. Moderately severe multivessel coronary artery disease not significantly changed since prior catheterization in 02/2018. DFR of the most severe lesion in the RCA at that time was not hemodynamically significant. 2. Normal left ventricular filling pressure. 3. Hyperdynamic left ventricular contraction.  Recommendations: 1. Medical therapy for moderate diffuse coronary artery disease. There is no clear culprit for PCI. I suspect troponin elevation reflects supply-demand mismatch. I will start isosorbide mononitrate 30 mg daily, which can be escalated as blood pressure tolerates for relief of chest pain. 2. Aggressive secondary prevention, including high-intensity statin therapy. 3. If no evidence of bleeding complications and no recurrence  of chest pain, discharge this afternoon or tomorrow could be considered from a cardiac standpoint.   Cardiac catheterization 01/17/2019  Mid LAD lesion is 75% stenosed. The LAD was not addressed and will be managed medically.  Ost LAD to Mid LAD lesion is 40% stenosed.  Mid Cx to Dist Cx lesion is 55% stenosed with 40% stenosed  side branch in Ost 3rd Mrg.  Prox RCA lesion is 75% stenosed.  A drug-eluting stent was successfully placed using a SYNERGY XD 3.0X38, postdilated to 3.5 mm.  Post intervention, there is a 0% residual stenosis.  Ost 1st Mrg lesion is 99% stenosed. EBU4 Guide catheter used.  A drug-eluting stent was successfully placed using a SYNERGY XD 2.25X24, postdilated to 2.75 mm.  Post intervention, there is a 0% residual stenosis.  Continue DAPT for 12 months. Would recommend clopidogrel long-term after one year since she has such diffuse disease. Continue aggressive secondary prevention.  Cardiac Cath 03/03/19  Mid LAD lesion is 75% stenosed.  Ost LAD to Mid LAD lesion is 40% stenosed.  Previously placed Ost 1st Mrg drug eluting stent is widely patent.  Mid Cx to Dist Cx lesion is 55% stenosed with 40% stenosed side branch in Ost 3rd Mrg.  Previously placed Prox RCA drug eluting stent is widely patent.  Ost 1st Mrg to 1st Mrg lesion is 99% stenosed. Partially in the stent and partially distal to the stent- subacte stent thrombosis.  LV end diastolic pressure is normal.  There is no aortic valve stenosis. Medical therapy for stent thrombosis of the OM stent. Given the severe tortuosity, I don't think repeat PCI attempt would be beneficial. There was significant difficulty getting a stent delivered during the last cath. I think there would be a high likelihood of completely occluding the vessel with wire manipulation.  Echocardiogram 01/16/2019 IMPRESSIONS 1. Left ventricular ejection fraction, by visual estimation, is 65 to 70%. The left ventricle has hyperdynamic function. There is moderately increased left ventricular hypertrophy. 2. Definity contrast agent was given IV to delineate the left ventricular endocardial borders. 3. Left ventricular diastolic parameters are consistent with Grade I diastolic dysfunction (impaired relaxation). 4. The left ventricle has no regional  wall motion abnormalities. 5. Global right ventricle has normal systolic function.The right ventricular size is mildly enlarged. No increase in right ventricular wall thickness. 6. Left atrial size was normal. 7. Right atrial size was normal. 8. The pericardial effusion is posterior to the left ventricle. 9. Trivial pericardial effusion is present. 10. The mitral valve was not well visualized. Trivial mitral valve regurgitation. 11. The tricuspid valve is not well visualized. 12. The aortic valve is tricuspid. Aortic valve regurgitation is not visualized. Mild aortic valve sclerosis without stenosis. 13. The pulmonic valve was grossly normal. Pulmonic valve regurgitation is not visualized. 14. The inferior vena cava is normal in size with <50% respiratory variability, suggesting right atrial pressure of 8 mmHg. 15. The interatrial septum was not well visualized.  14 Day Event Monitor (worn for 3 days)  Quality: Fair.  Baseline artifact. Predominant rhythm: sinus rhythm Average heart rate: 79 bpm Max heart rate: 108 bpm Min heart rate: 58 bpm  One PVC No significant arrhythmias  Tiffany C. Oval Linsey, MD, [facility] 09/15/2019   Assessment & Plan   1.  Tachycardia intermittent-heart rate today***.  Cardiac event monitor showed predominantly normal sinus rhythm with an average heart rate of 79 bpm, 1 PVC no significant arrhythmias.    Felt to be related to obesity secondary to OSA.  Plan to transition to a low nodal blocking agent pending ZIO monitor results. Continue carvedilol Heart healthy low-sodium diet-salty 6 given Increase physical activity as tolerated Avoid triggers caffeine, chocolate, EtOH etc.  Essential hypertension-BP today***.  Well-controlled at home.  On midodrine as needed with HD. Continue carvedilol, Imdur Heart healthy low-sodium diet-salty 6 given Increase physical activity as tolerated  Coronary artery disease-no chest pain today.  Status post PCI with  DES x2 to proximal RCA and OM.  LAD 70% medically managed.  Underwent repeat LHC 03/11/2019 and found to have in-stent thrombosis of OM stent.  This was not amenable to PCI medical management was recommended. Continue ASA, Brilinta, carvedilol, Heart healthy low-sodium diet-salty 6 given Increase physical activity as tolerated  Hyperlipidemia-01/16/2019: Cholesterol 185; HDL 39; LDL Cholesterol 116; Triglycerides 150; VLDL 30 Continue rosuvastatin Heart healthy low-sodium high-fiber diet Increase physical activity as tolerated Repeat lipid panel and LFTs  Disposition: Follow-up with Dr. Oval Linsey in 6 months.  Jossie Ng. Afsana Liera NP-C    09/18/2019, 1:56 PM [facility] [place] [address] Office [phone] Fax [phone]  Notice: This dictation was prepared with Dragon dictation along with smaller phrase technology. Any transcriptional errors that result from this process are unintentional and may not be corrected upon review.

## 2019-09-19 ENCOUNTER — Ambulatory Visit: Payer: BC Managed Care – PPO | Admitting: General Practice

## 2019-09-19 ENCOUNTER — Ambulatory Visit: Payer: Medicare Other | Admitting: General Practice

## 2019-09-19 NOTE — Progress Notes (Signed)
Virtual Visit via Telephone Note   This visit type was conducted due to national recommendations for restrictions regarding the COVID-19 Pandemic (e.g. social distancing) in an effort to limit this patient's exposure and mitigate transmission in our community.  Due to her co-morbid illnesses, this patient is at least at moderate risk for complications without adequate follow up.  This format is felt to be most appropriate for this patient at this time.  The patient did not have access to video technology/had technical difficulties with video requiring transitioning to audio format only (telephone).  All issues noted in this document were discussed and addressed.  No physical exam could be performed with this format.  Please refer to the patient's chart for her  consent to telehealth for [facility].  Evaluation Performed:  Follow-up visit  This visit type was conducted due to national recommendations for restrictions regarding the COVID-19 Pandemic (e.g. social distancing).  This format is felt to be most appropriate for this patient at this time.  All issues noted in this document were discussed and addressed.  No physical exam was performed (except for noted visual exam findings with Video Visits).  Please refer to the patient's chart (MyChart message for video visits and phone note for telephone visits) for the patient's consent to telehealth for [facility]  Date:  09/20/2019   ID:  [name], DOB 10-06-1965, MRN [id number]  Patient Location:  [place] [address]   Provider location:     [place] [address] Office [phone] Fax [phone]   PCP:  [name], MD  Cardiologist:  Skeet Latch, MD  Electrophysiologist:  None   Chief Complaint: Follow-up palpitations  History of Present Illness:    Brenda Davis is a 54 y.o. female who presents via  audio/video conferencing for a telehealth visit today.  Patient verified DOB and address.  [name] has a past medical history of NSTEMI with cardiac catheterization on 01/17/2019.  She had PCI with DES x2 to the proximalRCA and first marginal.  Her mid LAD was also 75% stenosed, this will be managed medically.  She also had 40% mid LAD, mid circumflex and distal circumflex lesions 55 and 40% stenosed.  She also has dyslipidemia, morbid obesity, history of acute respiratory failure with hypoxemia, HTN, ESRD on PD and history of hyperkalemia.   She presented to the hospital emergency department on 01/15/2019 for an evaluation of her chest pain.  She had reported that for the prior 2 weeks she been having fatigue.  Over that Thursday and Friday she also noticed decrease exercise tolerance her chest pain was nitro responsive.  She noticed that her chest pain was absent on Saturday and reappeared on Sunday.  She stated that her chest pain further progressed to substernal and left-sided chest pain with a stabbing type sensation.  She also had associated nausea diaphoresis, and shortness of breath.  She again took nitroglycerin x1 and her pain became worse.  She took another nitroglycerin and her pain continued to be 10/10.  Her husband called 98, she took 4 baby aspirin and a third nitroglycerin which helped her pain decrease.  Her presentation was similar to her presentation 06/2018.  Her LHC at that time showed no clear culprit lesions and she did not tolerate isosorbide.  She felt that it made her chest pain worse, she also developed diaphoresis with the medication.   She presented to the  clinic 02/01/19 and stated she was not able to tolerate her metoprolol tartrate due to low blood pressures.  She also decreased the amount of fluid she pulls with each dialysis session to 500 mL nightly.  Her blood pressure was 88/64.  She stated she had been sedentary since her cardiac catheterization due to her  incision with staples.  She did light walking in her house and some housework.  She had questions about her diet and her husband stated they have been eating packaged/processed foods such as canned spaghetti O's.  She had an appointment with nephrology on 02/02/2019.  I  gave him salty 6 she, refilled her Brilinta and Crestor, and asked her to increase her physical activity as tolerated.  She subsequently developed an ulcer and presented back to the emergency department on 03/11/2019 with unstable angina.  She is found to have a stent thrombosis of her OM stent.  It was not amenable to PCI medical management was recommended.  During her hospitalization she developed Covid pneumonia and sepsis.  She initially tested negative for Covid but subsequently tested positive.  Her husband also tested positive and therefore it was not found to be nosocomial.  She was treated with remdesivir and steroids.  She also had blood cultures that were positive for staph capitis which she received vancomycin and cefepime for.  She was transitioned to doxycycline.  She was last seen by Jory Sims, DNP on 08/02/2019.  She was found to be anxious about her heart rate and blood pressure at that time.  She noted that 2 days prior awake her heart rate had been up to 103-110-she had been sitting on the couch.  She described nauseousness and feelings of indigestion.  She did have some diaphoresis.  She indicated that she got up from the couch and walked to her bedroom.  She noted dyspnea along with near syncope.  Her husband took her blood pressure and found it to be 189/102 with a heart rate of 108.  She retook her blood pressure 2 to 3 minutes later and noticed it was slowly coming down heart rate increased to 98 bpm.  She indicated that she has had another episode of tachycardia and hypertension while at dialysis.  When she was seen by Jory Sims, DNP in follow-up she indicated that she had been compliant with her medicines had  not had any recurrent episodes of rapid heart rate or high blood pressure and denied bleeding, bruising, and myalgia.  Her nephrologist Dr. Ronnald Collum removed her HD catheter due to frequent infections and she was placed a left brachial AV fistula for HD which was working well.  She was being trained on home dialysis at that time.  She is seen virtually today today for follow-up evaluation and states she has been increasing her physical activity with more walking.  She states that she has better stamina at this time.  When asked about her cardiac event monitor she states that she was only able to complete 3 days prior to needing a surgery.  At that time she was instructed to remove the monitor.  She states that during the monitoring time she did not have any episodes of accelerated/palpitation.  However, she has noticed episodes of palpitations lasting 20-30 minutes, increased heart rate, and experienced trouble sleeping several times per week.  She denies dizziness, increased fatigue, and chest discomfort.  She is now been doing home dialysis for 2 to 3 weeks and does not anticipate any interruptions in her cardiac  monitoring.  I will prescribe diltiazem 120 mg, repeat her 14-day ZIO monitor, and have her avoid triggers for palpitations.  We will have her follow-up in 8 weeks.   She denies chest pain, shortness of breath, lower extremity edema, fatigue, melena, hematuria, hemoptysis, diaphoresis, weakness, syncope, orthopnea, and PND.  The patient does not symptoms concerning for COVID-19 infection (fever, chills, cough, or new SHORTNESS OF BREATH).    Prior CV studies:   The following studies were reviewed today:  Echocardiogram 01/04/2015 Study Conclusions   - Left ventricle: The cavity size was normal. Wall thickness was   normal. Systolic function was vigorous. The estimated ejection   fraction was in the range of 65% to 70%. Features are consistent   with a pseudonormal left ventricular filling  pattern, with   concomitant abnormal relaxation and increased filling pressure   (grade 2 diastolic dysfunction). - Mitral valve: There was mild regurgitation. - Pericardium, extracardiac: A trivial pericardial effusion was   identified.     48 Hour Holter Monitor 10/08/2016:   Quality: Fair.  Baseline artifact. Predominant rhythm: sinus rhythm Average heart rate: 64 bpm Max heart rate: 94 bpm Min heart rate: 50 bpm   Occasional PACs were noted that did not correspond with the fluttering reported by the patient.   LHC 06/20/18: Conclusions: 1. Moderately severe multivessel coronary artery disease not significantly changed since prior catheterization in 02/2018.  DFR of the most severe lesion in the RCA at that time was not hemodynamically significant. 2. Normal left ventricular filling pressure. 3. Hyperdynamic left ventricular contraction.   Recommendations: 1. Medical therapy for moderate diffuse coronary artery disease.  There is no clear culprit for PCI.  I suspect troponin elevation reflects supply-demand mismatch.  I will start isosorbide mononitrate 30 mg daily, which can be escalated as blood pressure tolerates for relief of chest pain. 2. Aggressive secondary prevention, including high-intensity statin therapy. 3. If no evidence of bleeding complications and no recurrence of chest pain, discharge this afternoon or tomorrow could be considered from a cardiac standpoint.     Cardiac catheterization 01/17/2019  Mid LAD lesion is 75% stenosed. The LAD was not addressed and will be managed medically.  Ost LAD to Mid LAD lesion is 40% stenosed.  Mid Cx to Dist Cx lesion is 55% stenosed with 40% stenosed side branch in Ost 3rd Mrg.  Prox RCA lesion is 75% stenosed.  A drug-eluting stent was successfully placed using a SYNERGY XD 3.0X38, postdilated to 3.5 mm.  Post intervention, there is a 0% residual stenosis.  Ost 1st Mrg lesion is 99% stenosed. EBU4 Guide catheter  used.  A drug-eluting stent was successfully placed using a SYNERGY XD 2.25X24, postdilated to 2.75 mm.  Post intervention, there is a 0% residual stenosis.   Continue DAPT for 12 months.  Would recommend clopidogrel long-term after one year since she has such diffuse disease.  Continue aggressive secondary prevention.   Cardiac Cath 03/03/19  Mid LAD lesion is 75% stenosed.  Ost LAD to Mid LAD lesion is 40% stenosed.  Previously placed Ost 1st Mrg drug eluting stent is widely patent.  Mid Cx to Dist Cx lesion is 55% stenosed with 40% stenosed side branch in Ost 3rd Mrg.  Previously placed Prox RCA drug eluting stent is widely patent.  Ost 1st Mrg to 1st Mrg lesion is 99% stenosed. Partially in the stent and partially distal to the stent- subacte stent thrombosis.  LV end diastolic pressure is normal.  There is no  aortic valve stenosis. Medical therapy for stent thrombosis of the OM stent.  Given the severe tortuosity, I don't think repeat PCI attempt would be beneficial.  There was significant difficulty getting a stent delivered during the last cath.  I think there would be a high likelihood of completely occluding the vessel with wire manipulation.    Echocardiogram 01/16/2019 IMPRESSIONS  1. Left ventricular ejection fraction, by visual estimation, is 65 to 70%. The left ventricle has hyperdynamic function. There is moderately increased left ventricular hypertrophy.  2. Definity contrast agent was given IV to delineate the left ventricular endocardial borders.  3. Left ventricular diastolic parameters are consistent with Grade I diastolic dysfunction (impaired relaxation).  4. The left ventricle has no regional wall motion abnormalities.  5. Global right ventricle has normal systolic function.The right ventricular size is mildly enlarged. No increase in right ventricular wall thickness.  6. Left atrial size was normal.  7. Right atrial size was normal.  8. The pericardial  effusion is posterior to the left ventricle.  9. Trivial pericardial effusion is present. 10. The mitral valve was not well visualized. Trivial mitral valve regurgitation. 11. The tricuspid valve is not well visualized. 12. The aortic valve is tricuspid. Aortic valve regurgitation is not visualized. Mild aortic valve sclerosis without stenosis. 13. The pulmonic valve was grossly normal. Pulmonic valve regurgitation is not visualized. 14. The inferior vena cava is normal in size with <50% respiratory variability, suggesting right atrial pressure of 8 mmHg. 15. The interatrial septum was not well visualized.  14 Day Event Monitor (worn for 3 days)   Quality: Fair.  Baseline artifact. Predominant rhythm: sinus rhythm Average heart rate: 79 bpm Max heart rate: 108 bpm Min heart rate: 58 bpm   One PVC No significant arrhythmias   Tiffany C. Oval Linsey, MD, [facility] 09/15/2019  Past Medical History:  Diagnosis Date  . Anemia   . CAD (coronary artery disease)    a. cath in 02/2018 showing moderate 3-vessel CAD with 45% mid-LADm 55% LCx and 65% RCA stenosis which was not significant by FFR  . ESRD on dialysis ([place])   . Gout   . HCAP (healthcare-associated pneumonia) 05/06/2016  . History of blood transfusion    "related to surgery"  . Hyperlipidemia   . Hypertension   . Morbid obesity ([place])   . Pain    LEFT SHOULDER PAIN - WAS SEEN AT AN URGENT CARE - GIVEN SLING FOR COMFORT AND TOLD ROM AS TOLERATED.  Marland Kitchen Palpitations 09/24/2016  . Peritonitis, dialysis-associated ([place])   . Type II diabetes mellitus ([place])    "gastric sleeve OR corrected this" (05/06/2016)   Past Surgical History:  Procedure Laterality Date  . AV FISTULA PLACEMENT Left 01/03/2015   Procedure: BRACHIAL CEPHALIC ARTERIOVENOUS  FISTULA CREATION LEFT ARM;  Surgeon: Angelia Mould, MD;  Location: [place];  Service: Vascular;  Laterality: Left;  . CARPAL TUNNEL RELEASE Right   . [place]  .  CHOLECYSTECTOMY  09/14/2017  . CORONARY STENT INTERVENTION N/A 01/17/2019   Procedure: CORONARY STENT INTERVENTION;  Surgeon: Jettie Booze, MD;  Location: [place] [facility];  Service: Cardiovascular;  Laterality: N/A;  . ESOPHAGOGASTRODUODENOSCOPY N/A 09/04/2012   Procedure: ESOPHAGOGASTRODUODENOSCOPY (EGD);  Surgeon: Shann Medal, MD;  Location: [facility];  Service: General;  Laterality: N/A;  PF  . ESOPHAGOGASTRODUODENOSCOPY (EGD) WITH ESOPHAGEAL DILATION N/A 09/29/2012   Procedure: ESOPHAGOGASTRODUODENOSCOPY (EGD) WITH ESOPHAGEAL DILATION;  Surgeon: Milus Banister, MD;  Location: [facility];  Service: Endoscopy;  Laterality: N/A;  . FEMORAL ARTERY EXPLORATION Right 01/17/2019   Procedure: Exploration Right Groin with Primary Closure or Arteriotomy Site; Washout of Retroperitoneal Hematoma;  Surgeon: Waynetta Sandy, MD;  Location: [place];  Service: Vascular;  Laterality: Right;  . INSERTION OF DIALYSIS CATHETER N/A 01/03/2015   Procedure: INSERTION OF DIALYSIS CATHETER RIGHT INTERNAL JUGULAR;  Surgeon: Angelia Mould, MD;  Location: [place];  Service: Vascular;  Laterality: N/A;  . LAPAROSCOPIC GASTRIC SLEEVE RESECTION N/A 07/19/2012   Procedure: LAPAROSCOPIC SLEEVE GASTRECTOMY with EGD;  Surgeon: Madilyn Hook, DO;  Location: [facility];  Service: General;  Laterality: N/A;  laparoscopic sleeve gastrectomy with EGD  . LEFT HEART CATH AND CORONARY ANGIOGRAPHY N/A 02/25/2018   Procedure: LEFT HEART CATH AND CORONARY ANGIOGRAPHY;  Surgeon: Leonie Man, MD;  Location: [place] [facility];  Service: Cardiovascular;  Laterality: N/A;  . LEFT HEART CATH AND CORONARY ANGIOGRAPHY N/A 06/20/2018   Procedure: LEFT HEART CATH AND CORONARY ANGIOGRAPHY;  Surgeon: Nelva Bush, MD;  Location: [place] [facility];  Service: Cardiovascular;  Laterality: N/A;  . LEFT HEART CATH AND CORONARY ANGIOGRAPHY N/A 01/16/2019   Procedure: LEFT HEART CATH AND CORONARY ANGIOGRAPHY;  Surgeon:  Jettie Booze, MD;  Location: [place] [facility];  Service: Cardiovascular;  Laterality: N/A;  . LEFT HEART CATH AND CORONARY ANGIOGRAPHY N/A 03/03/2019   Procedure: LEFT HEART CATH AND CORONARY ANGIOGRAPHY;  Surgeon: Jettie Booze, MD;  Location: [place] [facility];  Service: Cardiovascular;  Laterality: N/A;  . PERIPHERAL VASCULAR BALLOON ANGIOPLASTY Left 08/18/2019   Procedure: PERIPHERAL VASCULAR BALLOON ANGIOPLASTY;  Surgeon: Marty Heck, MD;  Location: [place] [facility];  Service: Cardiovascular;  Laterality: Left;  arm fistula  . PERIPHERAL VASCULAR CATHETERIZATION Left 05/23/2015   Procedure: Nolon Stalls;  Surgeon: Conrad Reynoldsville, MD;  Location: [place] [facility];  Service: Cardiovascular;  Laterality: Left;  upper aRM  . PERIPHERAL VASCULAR CATHETERIZATION Left 05/23/2015   Procedure: Peripheral Vascular Balloon Angioplasty;  Surgeon: Conrad Ter[place] Bella, MD;  Location: [place] [facility];  Service: Cardiovascular;  Laterality: Left;  av fistula  . TUBAL LIGATION  1993  . UPPER GI ENDOSCOPY N/A 07/19/2012   Procedure: UPPER GI ENDOSCOPY;  Surgeon: Madilyn Hook, DO;  Location: [facility];  Service: General;  Laterality: N/A;     Current Meds  Medication Sig  . acetaminophen (TYLENOL) 500 MG tablet Take 1,000 mg by mouth every 6 (six) hours as needed for moderate pain or headache.  Marland Kitchen aspirin EC 81 MG EC tablet Take 1 tablet (81 mg total) by mouth daily.  . calcitRIOL (ROCALTROL) 0.5 MCG capsule Take 1.5 mcg by mouth daily.   . calcium acetate (PHOSLO) 667 MG capsule Take 667-2,001 mg by mouth See admin instructions. Take 2001 mg with each meal and 667 mg with each snack  . carvedilol (COREG) 3.125 MG tablet Take 3.125 mg by mouth 2 (two) times daily as needed (high bp).   . gabapentin (NEURONTIN) 100 MG capsule Take 100 mg by mouth 2 (two) times daily.  . isosorbide dinitrate (ISORDIL) 10 MG tablet TAKE 1 TABLET BY MOUTH TWICE A DAY  . midodrine (PROAMATINE) 5 MG tablet Take 1  tablet (5 mg total) by mouth 3 (three) times daily with meals. (Patient taking differently: Take 5 mg by mouth 2 (two) times daily as needed (low bp). )  . nitroGLYCERIN (NITROSTAT) 0.4 MG SL tablet TAKE 1 TABLET BY MOUTH UNDER THE TONGUE EVERY  5 MINUTES AS NEEDED FOR CHEST PAIN (Patient taking differently: Place 0.4 mg under the tongue every 5 (five) minutes as needed for chest pain. )  . rosuvastatin (CRESTOR) 10 MG tablet Take 10 mg by mouth daily.  . ticagrelor (BRILINTA) 90 MG TABS tablet Take 1 tablet (90 mg total) by mouth 2 (two) times daily.   Current Facility-Administered Medications for the 09/20/19 encounter (Telemedicine) with Deberah Pelton, NP  Medication  . 0.9 %  sodium chloride infusion  . sodium chloride flush (NS) 0.9 % injection 3 mL  . sodium chloride flush (NS) 0.9 % injection 3 mL     Allergies:   Amlodipine, Atorvastatin, Clonidine derivatives, Doxycycline, and Welchol [colesevelam hcl]   Social History   Tobacco Use  . Smoking status: Former Smoker    Packs/day: 1.00    Years: 16.00    Pack years: 16.00    Types: Cigarettes    Quit date: 2009    Years since quitting: 12.6  . Smokeless tobacco: Never Used  Vaping Use  . Vaping Use: Never used  Substance Use Topics  . Alcohol use: Never  . Drug use: No     Family Hx: The patient's family history includes Arthritis in an other family member; Diabetes in her father; Hyperlipidemia in an other family member; Hypertension in her mother; Mitral valve prolapse in her mother. There is no history of Cancer, Heart disease, Kidney disease, or Stroke.  ROS:   Please see the history of present illness.     All other systems reviewed and are negative.   Labs/Other Tests and Data Reviewed:    Recent Labs: 03/14/2019: ALT 14 04/04/2019: Magnesium 1.9; Platelets 333.0 08/18/2019: BUN 26; Creatinine, Ser 6.80; Hemoglobin 12.6; Potassium 4.4; Sodium 136   Recent Lipid Panel Lab Results  Component Value Date/Time    CHOL 185 01/16/2019 04:49 AM   CHOL 191 12/26/2018 10:22 AM   TRIG 150 (H) 01/16/2019 04:49 AM   HDL 39 (L) 01/16/2019 04:49 AM   HDL 44 12/26/2018 10:22 AM   CHOLHDL 4.7 01/16/2019 04:49 AM   LDLCALC 116 (H) 01/16/2019 04:49 AM   LDLCALC 109 (H) 12/26/2018 10:22 AM   LDLDIRECT 161.6 11/28/2012 10:42 AM    Wt Readings from Last 3 Encounters:  09/20/19 276 lb (125.2 kg)  08/18/19 (!) 270 lb (122.5 kg)  08/02/19 280 lb 8 oz (127.2 kg)     Exam:    Vital Signs:  BP (!) 138/97   Pulse 86   Wt 276 lb (125.2 kg)   BMI 45.93 kg/m    Well nourished, well developed female in no  acute distress.   ASSESSMENT & PLAN:    1.  Tachycardia intermittent/palpitations -heart rate today 86bpm.  Cardiac event monitor showed predominantly normal sinus rhythm with an average heart rate of 79 bpm, 1 PVC no significant arrhythmias.    Felt to be related to obesity secondary to OSA.  Plan to transition to a low nodal blocking agent pending ZIO monitor results. Continue carvedilol Heart healthy low-sodium diet-salty 6 given Increase physical activity as tolerated Avoid triggers caffeine, chocolate, EtOH etc.  Essential hypertension-BP today 138/ 97.  Well-controlled at home.  On midodrine as needed with HD. Continue carvedilol, Imdur Heart healthy low-sodium diet-salty 6 given Increase physical activity as tolerated  Coronary artery disease-no chest pain today.  Status post PCI with DES x2 to proximal RCA and OM.  LAD 70% medically managed.  Underwent repeat LHC 03/11/2019 and found to  have in-stent thrombosis of OM stent.  This was not amenable to PCI medical management was recommended. Continue ASA, Brilinta, carvedilol, Heart healthy low-sodium diet-salty 6 given Increase physical activity as tolerated  Hyperlipidemia-01/16/2019: Cholesterol 185; HDL 39; LDL Cholesterol 116; Triglycerides 150; VLDL 30 Continue rosuvastatin Heart healthy low-sodium high-fiber diet Increase physical  activity as tolerated Repeat lipid panel and LFTs  Disposition: Follow-up with Dr. Oval Linsey or myself in 1 months.  COVID-19 Education: The signs and symptoms of COVID-19 were discussed with the patient and how to seek care for testing (follow up with PCP or arrange E-visit).  The importance of social distancing was discussed today.  Patient Risk:   After full review of this patients clinical status, I feel that they are at least moderate risk at this time.  Time:   Today, I have spent 18 minutes with the patient with telehealth technology discussing heart rate, medications, hemodialysis, cardiac event monitor.  Greater than 20 minutes spent reviewing patient chart previous cardiac tests, and medications.   Medication Adjustments/Labs and Tests Ordered: Current medicines are reviewed at length with the patient today.  Concerns regarding medicines are outlined above.   Tests Ordered: No orders of the defined types were placed in this encounter.  Medication Changes: No orders of the defined types were placed in this encounter.   Disposition:  in 8 week(s)  Signed, Jossie Ng. Raekwan Spelman NP-C    08/23/2018 11:58 AM    [address] Office [phone] Fax [phone]

## 2019-09-20 ENCOUNTER — Ambulatory Visit: Payer: BC Managed Care – PPO | Admitting: General Practice

## 2019-09-20 ENCOUNTER — Encounter: Payer: Self-pay | Admitting: *Deleted

## 2019-09-20 ENCOUNTER — Telehealth (INDEPENDENT_AMBULATORY_CARE_PROVIDER_SITE_OTHER): Payer: BC Managed Care – PPO | Admitting: General Practice

## 2019-09-20 ENCOUNTER — Encounter: Payer: Self-pay | Admitting: General Practice

## 2019-09-20 VITALS — BP 138/97 | HR 86 | Wt 276.0 lb

## 2019-09-20 DIAGNOSIS — I10 Essential (primary) hypertension: Secondary | ICD-10-CM

## 2019-09-20 DIAGNOSIS — E785 Hyperlipidemia, unspecified: Secondary | ICD-10-CM

## 2019-09-20 DIAGNOSIS — R002 Palpitations: Secondary | ICD-10-CM

## 2019-09-20 DIAGNOSIS — I2510 Atherosclerotic heart disease of native coronary artery without angina pectoris: Secondary | ICD-10-CM

## 2019-09-20 DIAGNOSIS — R000 Tachycardia, unspecified: Secondary | ICD-10-CM | POA: Diagnosis not present

## 2019-09-20 MED ORDER — DILTIAZEM HCL ER COATED BEADS 120 MG PO CP24
120.0000 mg | ORAL_CAPSULE | Freq: Every day | ORAL | 3 refills | Status: DC
Start: 2019-09-20 — End: 2020-02-02

## 2019-09-20 NOTE — Progress Notes (Signed)
Patient ID: Brenda Davis, female   DOB: [birth year], 54 y.o.   MRN: [id number] Patient enrolled for [employer] to ship a 14 day ZIO XT long term holter monitor to her home.

## 2019-09-20 NOTE — Patient Instructions (Addendum)
Medication Instructions:  INCREASE DILTIAZEM 120MG DAILY *If you need a refill on your cardiac medications before your next appointment, please call your pharmacy*  Lab Work: NONE ORDERED  Testing/Procedures: [place] Term Monitor Instructions   Your physician has requested you wear your ZIO patch monitor_14_days.   This is a single patch monitor.  [employer] supplies one patch monitor per enrollment.  Additional stickers are not available.   Please do not apply patch if you will be having a Nuclear Stress Test, Echocardiogram, Cardiac CT, MRI, or Chest Xray during the time frame you would be wearing the monitor. The patch cannot be worn during these tests.  You cannot remove and re-apply the ZIO XT patch monitor.   Your ZIO patch monitor will be sent USPS Priority mail from [facility] directly to your home address. The monitor may also be mailed to a PO BOX if home delivery is not available.   It may take 3-5 days to receive your monitor after you have been enrolled.   Once you have received you monitor, please review enclosed instructions.  Your monitor has already been registered assigning a specific monitor serial # to you.   Applying the monitor   Shave hair from upper left chest.   Hold abrader disc by orange tab.  Rub abrader in 40 strokes over left upper chest as indicated in your monitor instructions.   Clean area with 4 enclosed alcohol pads .  Use all pads to assure are is cleaned thoroughly.  Let dry.   Apply patch as indicated in monitor instructions.  Patch will be place under collarbone on left side of chest with arrow pointing upward.   Rub patch adhesive wings for 2 minutes.Remove white label marked "1".  Remove white label marked "2".  Rub patch adhesive wings for 2 additional minutes.   While looking in a mirror, press and release button in center of patch.  A small green light will flash 3-4 times .  This will be your only indicator the monitor has been  turned on.     Do not shower for the first 24 hours.  You may shower after the first 24 hours.   Press button if you feel a symptom. You will hear a small click.  Record Date, Time and Symptom in the Patient Log Book.   When you are ready to remove patch, follow instructions on last 2 pages of Patient Log Book.  Stick patch monitor onto last page of Patient Log Book.   Place Patient Log Book in [place] box.  Use locking tab on box and tape box closed securely.  The [employer] has [employer] on it.  Please place in mailbox as soon as possible.  Your physician should have your test results approximately 7 days after the monitor has been mailed back to [facility].   Call [place] at [phone] if you have questions regarding your ZIO XT patch monitor.  Call them immediately if you see an orange light blinking on your monitor.   If your monitor falls off in less than 4 days contact our Monitor department at [phone].  If your monitor becomes loose or falls off after 4 days call [employer] at [phone] for suggestions on securing your monitor.   Special Instructions Please try to avoid these triggers:  Do not use any products that have nicotine or tobacco in them. These include cigarettes, e-cigarettes, and chewing tobacco. If you need help quitting, ask  your doctor.  Eat heart-healthy foods. Talk with your doctor about the right eating plan for you.  Exercise regularly as told by your doctor.  Do not drink alcohol, Caffeine or chocolate.  Lose weight if you are overweight.  Do not use drugs, including cannabis   Follow-Up: Your next appointment:  8 week(s) In Person with Skeet Latch, MD  -[place], FNP-C 11-15-2019 @ 915am  At [facility], you and your health needs are our priority.  As part of our continuing mission to provide you with exceptional heart care, we have created designated Provider Care Teams.  These Care Teams  include your primary Cardiologist (physician) and Advanced Practice Providers (APPs -  Physician Assistants and Nurse Practitioners) who all work together to provide you with the care you need, when you need it.

## 2019-09-21 ENCOUNTER — Telehealth: Payer: Self-pay | Admitting: Cardiovascular Disease

## 2019-09-21 NOTE — Telephone Encounter (Signed)
STAT if patient feels like he/she is going to faint   1) Are you dizzy now? Yes   2) Do you feel faint or have you passed out? No  3) Do you have any other symptoms? No  4) Have you checked your HR and BP (record if available)? No

## 2019-09-21 NOTE — Telephone Encounter (Signed)
PER JESSE CLEAVER STOP DILTIAZEM AND F/U WITH TR 10-19-2019  PT NOTIFIED, SHE WILL CALL WITH ANY SYMPTOMS

## 2019-09-21 NOTE — Telephone Encounter (Signed)
I spoke with patient. She started cardizem yesterday about 1 PM after telemedicine visit. Last night at 11 PM she started feeling dizzy. States she has dizziness at times. This morning when she sat up the room was spinning. She stood up to go to bathroom and felt like she was going to fall backward.  She did not fall.  She went back to bed until around noon.  She got up and took Cardizem and ate lunch. Still felt a little dizzy.  Went back to bed until 2 PM.  When she sat up the room started spinning again.  She states currently room spins and she feels dizzy when sitting, lying or standing.  Does seem to be worse when changing positions. Her daughter is currently with her to assist her if needed. Patient reports BP this AM was 158/88 and heart rate was 74.  Her BP has been up so she states Dr Marval Regal told her to increase Coreg to 2 of the 3.125 mg tablets twice daily.  She has been doing this for the last 3 days.  I advised patient to make sure someone stays with her while she is ambulating.  I advised her to go to ED if she passes out or falls. Patient reports symptoms started since she started taking Cardizem yesterday. Will forward to Coletta Memos, NP for review/recommendations.

## 2019-09-26 ENCOUNTER — Ambulatory Visit: Payer: Medicare Other | Admitting: Cardiovascular Disease

## 2019-09-29 ENCOUNTER — Other Ambulatory Visit (INDEPENDENT_AMBULATORY_CARE_PROVIDER_SITE_OTHER): Payer: BC Managed Care – PPO

## 2019-09-29 DIAGNOSIS — R002 Palpitations: Secondary | ICD-10-CM | POA: Diagnosis not present

## 2019-10-19 ENCOUNTER — Other Ambulatory Visit: Payer: Self-pay

## 2019-10-19 ENCOUNTER — Encounter: Payer: Self-pay | Admitting: Cardiovascular Disease

## 2019-10-19 ENCOUNTER — Ambulatory Visit (INDEPENDENT_AMBULATORY_CARE_PROVIDER_SITE_OTHER): Payer: BC Managed Care – PPO | Admitting: Cardiovascular Disease

## 2019-10-19 VITALS — BP 142/80 | HR 65 | Ht 65.0 in | Wt 278.0 lb

## 2019-10-19 DIAGNOSIS — I2510 Atherosclerotic heart disease of native coronary artery without angina pectoris: Secondary | ICD-10-CM

## 2019-10-19 DIAGNOSIS — Z5181 Encounter for therapeutic drug level monitoring: Secondary | ICD-10-CM | POA: Diagnosis not present

## 2019-10-19 DIAGNOSIS — I10 Essential (primary) hypertension: Secondary | ICD-10-CM

## 2019-10-19 DIAGNOSIS — Z6841 Body Mass Index (BMI) 40.0 and over, adult: Secondary | ICD-10-CM

## 2019-10-19 DIAGNOSIS — I5032 Chronic diastolic (congestive) heart failure: Secondary | ICD-10-CM | POA: Diagnosis not present

## 2019-10-19 DIAGNOSIS — E785 Hyperlipidemia, unspecified: Secondary | ICD-10-CM

## 2019-10-19 DIAGNOSIS — I200 Unstable angina: Secondary | ICD-10-CM

## 2019-10-19 MED ORDER — ISOSORBIDE DINITRATE 20 MG PO TABS: 20.0000 mg | ORAL_TABLET | Freq: Two times a day (BID) | ORAL | 3 refills | Status: DC

## 2019-10-19 MED ORDER — ROSUVASTATIN CALCIUM 20 MG PO TABS: 20.0000 mg | ORAL_TABLET | Freq: Every day | ORAL | 3 refills | Status: DC

## 2019-10-19 NOTE — Progress Notes (Signed)
Cardiology Office Note   Date:  10/19/2019   ID:  Brenda Davis, DOB 1965-08-25, MRN [id number]  PCP:  Brenda Lima, MD  Cardiologist:  Brenda Latch, MD  Electrophysiologist:  None   Evaluation Performed:  Follow-Up Visit  Chief Complaint:  CAD   History of Present Illness:    Brenda Davis is a 54 y.o. female with moderate CAD, diabetes, hypertension, hyperlipidemia, ESRD on PD here for follow up.  She was initially seen in 2018 for an evaluation of palpitations.  She saw Dr. Marval Davis and reported palpitations. She was referred to cardiology for further evaluation.  Whenever she is on dialysis she has palpitations during the last two hours.  It feels like her heart is fluttering and she feels like she is going to pass out.  It gets progressively worse during that time period and she gets short of breath.  Once she stops HD the symptoms subside.  Her BP is typically in the 128N systolic.  It drops to the 100s while on hemodialysis.   She wore a 48-hour Holter that showed no arrhythmias and occasional PACs.  She had an echo 01/04/15 revealed LVEF 65-70% with grade 2 diastolic dysfunction and mild mitral regurgitation.  She presented to the hospital 06/2018 with a NSTEMI. She underwent left heart cath and was found to have moderate disease.  FFR of the RCA was within normal limits.  Medical therapy was recommended.  She was started on Imdur and Brenda Davis.  However she did not tolerate nitrates.  She followed up with Brenda Sims, DNP and was started on gabapentin for concern for neuropathic pain.  Since that time she has been feeling much better.  She has needed nitro at approximately twice per month.  She only takes 1 tablet when it occurs.  Her chest pain always occurs at rest.  She has not been getting much exercise lately.  She notes that she has gained 30 pounds.  She plans to start walking her lunch break and working on her diet.  She has been trying to  limit fried foods but craves them.  She checks her blood pressure twice daily.  It is typically low in the morning when she finishes her peritoneal dialysis.  In the afternoon it is higher, but it stays below the 867E systolic.  Her nephrologist Dr. Atorvastatin in September due to elevated LFTs.  She reports that they normalized on repeat lab evaluation.  Since her last appointment Ms. Brenda Davis was admitted 12/2018 with NSTEMI.  She underwent DES to the proximal RCA and OM1.  She also had a 75% LAD lesion that was medically managed.  That hospitalization was complicated by a R femoral groin hematoma that required exploration and then developed an ulcer.  She presented ot the ED 02/2019 with unstable angina.  She was found to have stent thrombosis of the OM stent.  It was not amenable to PCI and medical management was recommended.  During that hospitalization she developed Covid pneumonia and sepsis.  She tested negative for Covid on admission but subsequently tested positive during the hospitalization.  Her husband also tested positive and had been at home, therefore this is not thought to be hospital-acquired.  She was treated with remdesivir and steroids.  Blood cultres were positive for Staph capitis.  She was treated with vancomycin/cefepime and then transitioned to doxycycline.   Since going home she has been feeling well.  She has started walking up and down her driveway and in  her back yard.  She is noticing improvement in her stamina and hopes to be able to walk to the end of her street.  She hasn't had any chest pain.  Her breathing is improving.  Her R groin has finally healed.  She has not had any episodes of chest pain or pressure.  She notes that her weight fluctuates quite a bit.  She continues to feel very tired and wonders if this is related to her prior Covid infection.  Her dialysis nurse noted that the pulses in her right leg are not as strong as the ones in her right.  She instructed  her to follow-up with vein and vascular.  Sometimes her right leg feels like it gets numb.  She has difficult time lifting her legs and feels like she is always walking around with weights on them. __  Since her last appointment she had some palpitations and tachycardia.  She saw our APP and was referred for an event monitor that showed 6 beats of SVT and rare PVCs.  Since then her BP has been running high.  It has been as high as 170s during HD.  While this has been ongoing she has also been experiencing chest pain.  She has been taking nitroglycerin which helps.  Lately she notes that she has been feeling short of breath when she walks.  She notes that her heart tends to race if she stresses or gets excited.   Past Medical History:  Diagnosis Date  . Anemia   . CAD (coronary artery disease)    a. cath in 02/2018 showing moderate 3-vessel CAD with 45% mid-LADm 55% LCx and 65% RCA stenosis which was not significant by FFR  . ESRD on dialysis ([place])   . Gout   . HCAP (healthcare-associated pneumonia) 05/06/2016  . History of blood transfusion    "related to surgery"  . Hyperlipidemia   . Hypertension   . Morbid obesity ([place])   . Pain    LEFT SHOULDER PAIN - WAS SEEN AT AN URGENT CARE - GIVEN SLING FOR COMFORT AND TOLD ROM AS TOLERATED.  Marland Kitchen Palpitations 09/24/2016  . Peritonitis, dialysis-associated ([place])   . Type II diabetes mellitus ([place])    "gastric sleeve OR corrected this" (05/06/2016)   Past Surgical History:  Procedure Laterality Date  . AV FISTULA PLACEMENT Left 01/03/2015   Procedure: BRACHIAL CEPHALIC ARTERIOVENOUS  FISTULA CREATION LEFT ARM;  Surgeon: Angelia Mould, MD;  Location: [facility];  Service: Vascular;  Laterality: Left;  . CARPAL TUNNEL RELEASE Right   . [place]  . CHOLECYSTECTOMY  09/14/2017  . CORONARY STENT INTERVENTION N/A 01/17/2019   Procedure: CORONARY STENT INTERVENTION;  Surgeon: Jettie Booze, MD;  Location: [facility];   Service: Cardiovascular;  Laterality: N/A;  . ESOPHAGOGASTRODUODENOSCOPY N/A 09/04/2012   Procedure: ESOPHAGOGASTRODUODENOSCOPY (EGD);  Surgeon: Shann Medal, MD;  Location: [facility];  Service: General;  Laterality: N/A;  PF  . ESOPHAGOGASTRODUODENOSCOPY (EGD) WITH ESOPHAGEAL DILATION N/A 09/29/2012   Procedure: ESOPHAGOGASTRODUODENOSCOPY (EGD) WITH ESOPHAGEAL DILATION;  Surgeon: Milus Banister, MD;  Location: [facility];  Service: Endoscopy;  Laterality: N/A;  . FEMORAL ARTERY EXPLORATION Right 01/17/2019   Procedure: Exploration Right Groin with Primary Closure or Arteriotomy Site; Washout of Retroperitoneal Hematoma;  Surgeon: Waynetta Sandy, MD;  Location: [facility];  Service: Vascular;  Laterality: Right;  . INSERTION OF DIALYSIS CATHETER N/A 01/03/2015   Procedure: INSERTION OF DIALYSIS CATHETER RIGHT INTERNAL JUGULAR;  Surgeon: Angelia Mould, MD;  Location[facility];  Service: Vascular;  Laterality: N/A;  . LAPAROSCOPIC GASTRIC SLEEVE RESECTION N/A 07/19/2012   Procedure: LAPAROSCOPIC SLEEVE GASTRECTOMY with EGD;  Surgeon: Madilyn Hook, DO;  Location: [facility];  Service: General;  Laterality: N/A;  laparoscopic sleeve gastrectomy with EGD  . LEFT HEART CATH AND CORONARY ANGIOGRAPHY N/A 02/25/2018   Procedure: LEFT HEART CATH AND CORONARY ANGIOGRAPHY;  Surgeon: Leonie Man, MD;  Location: [facility];  Service: Cardiovascular;  Laterality: N/A;  . LEFT HEART CATH AND CORONARY ANGIOGRAPHY N/A 06/20/2018   Procedure: LEFT HEART CATH AND CORONARY ANGIOGRAPHY;  Surgeon: Nelva Bush, MD;  Location: [facility];  Service: Cardiovascular;  Laterality: N/A;  . LEFT HEART CATH AND CORONARY ANGIOGRAPHY N/A 01/16/2019   Procedure: LEFT HEART CATH AND CORONARY ANGIOGRAPHY;  Surgeon: Jettie Booze, MD;  Location: [facility];  Service: Cardiovascular;  Laterality: N/A;  . LEFT HEART CATH AND CORONARY ANGIOGRAPHY N/A 03/03/2019   Procedure: LEFT HEART CATH  AND CORONARY ANGIOGRAPHY;  Surgeon: Jettie Booze, MD;  Location: [facility];  Service: Cardiovascular;  Laterality: N/A;  . PERIPHERAL VASCULAR BALLOON ANGIOPLASTY Left 08/18/2019   Procedure: PERIPHERAL VASCULAR BALLOON ANGIOPLASTY;  Surgeon: Marty Heck, MD;  Location: [facility];  Service: Cardiovascular;  Laterality: Left;  arm fistula  . PERIPHERAL VASCULAR CATHETERIZATION Left 05/23/2015   Procedure: Nolon Stalls;  Surgeon: Conrad Sterling, MD;  Location: [facility];  Service: Cardiovascular;  Laterality: Left;  upper aRM  . PERIPHERAL VASCULAR CATHETERIZATION Left 05/23/2015   Procedure: Peripheral Vascular Balloon Angioplasty;  Surgeon: Conrad Aurora, MD;  Location: [facility];  Service: Cardiovascular;  Laterality: Left;  av fistula  . TUBAL LIGATION  1993  . UPPER GI ENDOSCOPY N/A 07/19/2012   Procedure: UPPER GI ENDOSCOPY;  Surgeon: Madilyn Hook, DO;  Location: [facility];  Service: General;  Laterality: N/A;     Current Meds  Medication Sig  . acetaminophen (TYLENOL) 500 MG tablet Take 1,000 mg by mouth every 6 (six) hours as needed for moderate pain or headache.  Marland Kitchen aspirin EC 81 MG EC tablet Take 1 tablet (81 mg total) by mouth daily.  . B Complex-C-Biotin-D-FA (DIALYVITE 800 PLUS D) 800 MCG WAFR Take by mouth.  . calcitRIOL (ROCALTROL) 0.5 MCG capsule Take 1.5 mcg by mouth daily.   . calcium acetate (PHOSLO) 667 MG capsule Take 667-2,001 mg by mouth See admin instructions. Take 2001 mg with each meal and 667 mg with each snack  . carvedilol (COREG) 25 MG tablet Take 25 mg by mouth 2 (two) times daily.  Marland Kitchen diltiazem (CARDIZEM CD) 120 MG 24 hr capsule Take 1 capsule (120 mg total) by mouth daily. Take after Lunch  . ferric citrate (AURYXIA) 1 GM 210 MG(Fe) tablet Take 2 tablets (420 mg total) by mouth 3 (three) times daily with meals.  . folic acid (FOLVITE) 1 MG tablet Take 1 tablet (1 mg total) by mouth daily.  Marland Kitchen gabapentin (NEURONTIN) 100 MG  capsule Take 100 mg by mouth 2 (two) times daily.  . isosorbide dinitrate (ISORDIL) 20 MG tablet Take 1 tablet (20 mg total) by mouth 2 (two) times daily.  . Methoxy PEG-Epoetin Beta (MIRCERA IJ) Inject into the skin.  . mupirocin ointment (BACTROBAN) 2 % Apply topically.  . nitroGLYCERIN (NITROSTAT) 0.4 MG SL tablet TAKE 1 TABLET BY MOUTH UNDER THE TONGUE EVERY 5 MINUTES AS NEEDED FOR  CHEST PAIN (Patient taking differently: Place 0.4 mg under the tongue every 5 (five) minutes as needed for chest pain. )  . rosuvastatin (CRESTOR) 20 MG tablet Take 1 tablet (20 mg total) by mouth daily.  . ticagrelor (BRILINTA) 90 MG TABS tablet Take 1 tablet (90 mg total) by mouth 2 (two) times daily.  . [DISCONTINUED] isosorbide dinitrate (ISORDIL) 10 MG tablet TAKE 1 TABLET BY MOUTH TWICE A DAY  . [DISCONTINUED] midodrine (PROAMATINE) 5 MG tablet Take 1 tablet (5 mg total) by mouth 3 (three) times daily with meals. (Patient taking differently: Take 5 mg by mouth 2 (two) times daily as needed (low bp). )  . [DISCONTINUED] rosuvastatin (CRESTOR) 10 MG tablet Take 10 mg by mouth daily.   Current Facility-Administered Medications for the 10/19/19 encounter (Office Visit) with Brenda Latch, MD  Medication  . 0.9 %  sodium chloride infusion  . sodium chloride flush (NS) 0.9 % injection 3 mL  . sodium chloride flush (NS) 0.9 % injection 3 mL     Allergies:   Amlodipine, Brenda Davis, Clonidine derivatives, Doxycycline, and Welchol [colesevelam hcl]   Social History   Tobacco Use  . Smoking status: Former Smoker    Packs/day: 1.00    Years: 16.00    Pack years: 16.00    Types: Cigarettes    Quit date: 2009    Years since quitting: 12.7  . Smokeless tobacco: Never Used  Vaping Use  . Vaping Use: Never used  Substance Use Topics  . Alcohol use: Never  . Drug use: No     Family Hx: The patient's family history includes Arthritis in an other family member; Diabetes in her father; Hyperlipidemia in an  other family member; Hypertension in her mother; Mitral valve prolapse in her mother. There is no history of Cancer, Heart disease, Kidney disease, or Stroke.  ROS:   Please see the history of present illness.     All other systems reviewed and are negative.   Prior CV studies:   The following studies were reviewed today:  Echo 01/04/2015: Study Conclusions  - Left ventricle: The cavity size was normal. Wall thickness was   normal. Systolic function was vigorous. The estimated ejection   fraction was in the range of 65% to 70%. Features are consistent   with a pseudonormal left ventricular filling pattern, with   concomitant abnormal relaxation and increased filling pressure   (grade 2 diastolic dysfunction). - Mitral valve: There was mild regurgitation. - Pericardium, extracardiac: A trivial pericardial effusion was   identified.   48 Hour Holter Monitor 10/08/2016:  Quality: Fair.  Baseline artifact. Predominant rhythm: sinus rhythm Average heart rate: 64 bpm Max heart rate: 94 bpm Min heart rate: 50 bpm  Occasional PACs were noted that did not correspond with the fluttering reported by the patient.  LHC 06/20/18: Conclusions: 1. Moderately severe multivessel coronary artery disease not significantly changed since prior catheterization in 02/2018.  DFR of the most severe lesion in the RCA at that time was not hemodynamically significant. 2. Normal left ventricular filling pressure. 3. Hyperdynamic left ventricular contraction.  Recommendations: 1. Medical therapy for moderate diffuse coronary artery disease.  There is no clear culprit for PCI.  I suspect troponin elevation reflects supply-demand mismatch.  I will start isosorbide mononitrate 30 mg daily, which can be escalated as blood pressure tolerates for relief of chest pain. 2. Aggressive secondary prevention, including high-intensity statin therapy. 3. If no evidence of bleeding complications and no recurrence of  chest pain, discharge this afternoon or tomorrow could be considered from a cardiac standpoint.   Cardiac catheterization 01/17/2019  Mid LAD lesion is 75% stenosed. The LAD was not addressed and will be managed medically.  Ost LAD to Mid LAD lesion is 40% stenosed.  Mid Cx to Dist Cx lesion is 55% stenosed with 40% stenosed side branch in Ost 3rd Mrg.  Prox RCA lesion is 75% stenosed.  A drug-eluting stent was successfully placed using a SYNERGY XD 3.0X38, postdilated to 3.5 mm.  Post intervention, there is a 0% residual stenosis.  Ost 1st Mrg lesion is 99% stenosed. EBU4 Guide catheter used.  A drug-eluting stent was successfully placed using a SYNERGY XD 2.25X24, postdilated to 2.75 mm.  Post intervention, there is a 0% residual stenosis.  Continue DAPT for 12 months. Would recommend clopidogrel long-term after one year since she has such diffuse disease. Continue aggressive secondary prevention.  Cardiac Cath 03/03/19  Mid LAD lesion is 75% stenosed.  Ost LAD to Mid LAD lesion is 40% stenosed.  Previously placed Ost 1st Mrg drug eluting stent is widely patent.  Mid Cx to Dist Cx lesion is 55% stenosed with 40% stenosed side branch in Ost 3rd Mrg.  Previously placed Prox RCA drug eluting stent is widely patent.  Ost 1st Mrg to 1st Mrg lesion is 99% stenosed. Partially in the stent and partially distal to the stent- subacte stent thrombosis.  LV end diastolic pressure is normal.  There is no aortic valve stenosis. Medical therapy for stent thrombosis of the OM stent. Given the severe tortuosity, I don't think repeat PCI attempt would be beneficial. There was significant difficulty getting a stent delivered during the last cath. I think there would be a high likelihood of completely occluding the vessel with wire manipulation.  Echocardiogram 01/16/2019 IMPRESSIONS 1. Left ventricular ejection fraction, by visual estimation, is 65 to 70%. The left ventricle has  hyperdynamic function. There is moderately increased left ventricular hypertrophy. 2. Definity contrast agent was given IV to delineate the left ventricular endocardial borders. 3. Left ventricular diastolic parameters are consistent with Grade I diastolic dysfunction (impaired relaxation). 4. The left ventricle has no regional wall motion abnormalities. 5. Global right ventricle has normal systolic function.The right ventricular size is mildly enlarged. No increase in right ventricular wall thickness. 6. Left atrial size was normal. 7. Right atrial size was normal. 8. The pericardial effusion is posterior to the left ventricle. 9. Trivial pericardial effusion is present. 10. The mitral valve was not well visualized. Trivial mitral valve regurgitation. 11. The tricuspid valve is not well visualized. 12. The aortic valve is tricuspid. Aortic valve regurgitation is not visualized. Mild aortic valve sclerosis without stenosis. 13. The pulmonic valve was grossly normal. Pulmonic valve regurgitation is not visualized. 14. The inferior vena cava is normal in size with <50% respiratory variability, suggesting right atrial pressure of 8 mmHg. 15. The interatrial septum was not well visualized.  Labs/Other Tests and Data Reviewed:    EKG:  An ECG dated 05/02/19 was personally reviewed today and demonstrated:  sinus rhythm.  Rate 87 bpm.  Non-specific t wave abnormalities  Recent Labs: 03/14/2019: ALT 14 04/04/2019: Magnesium 1.9; Platelets 333.0 08/18/2019: BUN 26; Creatinine, Ser 6.80; Hemoglobin 12.6; Potassium 4.4; Sodium 136   Recent Lipid Panel Lab Results  Component Value Date/Time   CHOL 185 01/16/2019 04:49 AM   CHOL 191 12/26/2018 10:22 AM   TRIG 150 (H) 01/16/2019 04:49 AM   HDL 39 (L) 01/16/2019 04:49  AM   HDL 44 12/26/2018 10:22 AM   CHOLHDL 4.7 01/16/2019 04:49 AM   LDLCALC 116 (H) 01/16/2019 04:49 AM   LDLCALC 109 (H) 12/26/2018 10:22 AM   LDLDIRECT 161.6 11/28/2012 10:42  AM    Wt Readings from Last 3 Encounters:  10/19/19 278 lb (126.1 kg)  09/20/19 276 lb (125.2 kg)  08/18/19 (!) 270 lb (122.5 kg)    04/25/19: hgb 11.4,  Total cholesterol 243, triglycerided 152, hdl 45, LDL 168 rosuva 20 mg.  Found the bottle  Objective:    VS:  BP (!) 142/80   Pulse 65   Ht 5' 5"  (1.651 m)   Wt 278 lb (126.1 kg)   SpO2 98%   BMI 46.26 kg/m  , BMI Body mass index is 46.26 kg/m. GENERAL:  Well appearing HEENT: Pupils equal round and reactive, fundi not visualized, oral mucosa unremarkable NECK:  No jugular venous distention, waveform within normal limits, carotid upstroke brisk and symmetric, no bruits, no thyromegaly LYMPHATICS:  No cervical adenopathy LUNGS:  Clear to auscultation bilaterally HEART:  RRR.  PMI not displaced or sustained,S1 and S2 within normal limits, no S3, no S4, no clicks, no rubs, no murmurs ABD:  Flat, positive bowel sounds normal in frequency in pitch, no bruits, no rebound, no guarding, no midline pulsatile mass, no hepatomegaly, no splenomegaly EXT:  1+ L DP/PT.  2+ R DP/PT. No edema, no cyanosis no clubbing.  L UE fistula SKIN:  No rashes no nodules NEURO:  Cranial nerves II through XII grossly intact, motor grossly intact throughout PSYCH:  Cognitively intact, oriented to person place and time  ASSESSMENT & PLAN:    # CAD: # Hyperlipidemia:  She underwent had an NSTEMI 12/2018 and was found to have multivessel disease.  She was not felt to be a good surgical candidate and underwent PCI of the RCA and OM1.  There was residual 75% mid LAD disease, 55% mid left circumflex and otherwise mild to moderate disease.  She developed in-stent thrombosis of the OM that was medically managed.  Lately she has had some chest discomfort and her blood pressures been poorly controlled.  We will increase carvedilol to 25 mg twice a day.  We will also increase Isordil to 20 mg twice a day.  If she continues to have chest discomfort we will need to repeat  her heart catheterization and consider PCI of the LAD stenosis.  She was never able to participate in cardiac rehab due to Covid.  We will refer her now.  Lipids are not at goal.  Increase rosuvastatin to 20 mg and repeat lipids and a CMP.  She previously had transaminitis with another statin.  # Leg pain: Ms. Brenda Davis is having heavy legs and pain in her right leg especially when walking.  Her right pulses are diminished.  She had a groin hematoma that required exploration in the hospital.  Her groin wound has healed.  She is going to contact her vein and vascular specialist for further evaluation.  # ESRD: On PD and doing well.  # Hypotension: No longer having hypotension.  Midodrine has been discontinued.  # Morbid obesity:  Cardiac rehab referral as above.    Medication Adjustments/Labs and Tests Ordered: Current medicines are reviewed at length with the patient today.  Concerns regarding medicines are outlined above.   Tests Ordered: Orders Placed This Encounter  Procedures  . Lipid panel  . Comprehensive metabolic panel    Medication Changes: Meds ordered this  encounter  Medications  . isosorbide dinitrate (ISORDIL) 20 MG tablet    Sig: Take 1 tablet (20 mg total) by mouth 2 (two) times daily.    Dispense:  180 tablet    Refill:  3    NEW DOSE, D/C 10 MG RX  . rosuvastatin (CRESTOR) 20 MG tablet    Sig: Take 1 tablet (20 mg total) by mouth daily.    Dispense:  90 tablet    Refill:  3    NEW DOSE, D/C 10 MG RX    Follow Up:  Either In Person or Virtual in 1 month(s)  Signed, Brenda Latch, MD  10/19/2019 9:32 AM    [facility]

## 2019-10-19 NOTE — Patient Instructions (Addendum)
Medication Instructions:  INCREASE YOUR ISORDIL TO 20 MG TWICE A DAY   INCREASE ROSUVASTATIN TO 20 MG DAILY   INCREASE YOUR CARVEDILOL TO 25 MG TWICE A DAY   STOP MIDODRINE   *If you need a refill on your cardiac medications before your next appointment, please call your pharmacy*  Lab Work: FASTING LP/CMET IN 3 MONTHS  If you have labs (blood work) drawn today and your tests are completely normal, you will receive your results only by: Marland Kitchen MyChart Message (if you have MyChart) OR . A paper copy in the mail If you have any lab test that is abnormal or we need to change your treatment, we will call you to review the results.   Testing/Procedures: NONE   Follow-Up: KEEP FOLLOW UP AS SCHEDULED   You have been referred to CARDIAC REHAB, THEY WILL BE IN TOUCH WITH YOU

## 2019-10-19 NOTE — Addendum Note (Signed)
Addended by: Alvina Filbert B on: 10/19/2019 09:56 AM   Modules accepted: Orders

## 2019-10-23 ENCOUNTER — Telehealth (HOSPITAL_COMMUNITY): Payer: Self-pay | Admitting: *Deleted

## 2019-10-23 NOTE — Telephone Encounter (Signed)
Received referral for this pt to participate in cardiac rehab. S/p 01/15/19 NSTEMI; 01/17/2019 DES x 2.  Clinical review of pt follow up appt on 10/19/19 with Dr. Oval Linsey- cardiologist office note. Called and spoke to pt regarding her nephrology care.  Pt now does in home hemodialysis 4 evenings a week.  Pt sees Dr. Marval Regal once a month at the [facility] dialysis center   Sent clearance letter from either Dr. Marval Regal or APP giving ok for participation and bp parameters.  Pt has upcoming clinic appt on 10/24. Will verify insurance and scheduled when clearance form completed.  Pt Covid Risk Score is **.  Will forward to staff for follow up. Cherre Huger, BSN Cardiac and Training and development officer

## 2019-10-24 ENCOUNTER — Ambulatory Visit: Payer: Medicare Other | Admitting: Gastroenterology

## 2019-11-01 ENCOUNTER — Encounter: Payer: Self-pay | Admitting: General Practice

## 2019-11-02 ENCOUNTER — Telehealth (HOSPITAL_COMMUNITY): Payer: Self-pay

## 2019-11-02 NOTE — Telephone Encounter (Signed)
Pt insurance is active and benefits verified through Medicare a/b Co-pay 0, DED $203/$203 met, out of pocket 0/0 met, co-insurance 20%. no pre-authorization required. Passport, 11/02/2019@8 :26am, REF# [id number]  2ndary insurance is active and benefits verified through [facility]. Co-pay 0, DED $750/$750 met, out of pocket $3,750/$3,750 met, co-insurance 10%. No pre-authorization required. Passport, 11/02/2019@8 :52am, REF# [id number]

## 2019-11-14 NOTE — Progress Notes (Signed)
Virtual Visit via Telephone Note   This visit type was conducted due to national recommendations for restrictions regarding the COVID-19 Pandemic (e.g. social distancing) in an effort to limit this patient's exposure and mitigate transmission in our community.  Due to her co-morbid illnesses, this patient is at least at moderate risk for complications without adequate follow up.  This format is felt to be most appropriate for this patient at this time.  The patient did not have access to video technology/had technical difficulties with video requiring transitioning to audio format only (telephone).  All issues noted in this document were discussed and addressed.  No physical exam could be performed with this format.  Please refer to the patient's chart for her  consent to telehealth for [facility].  Evaluation Performed:  Follow-up visit  This visit type was conducted due to national recommendations for restrictions regarding the COVID-19 Pandemic (e.g. social distancing).  This format is felt to be most appropriate for this patient at this time.  All issues noted in this document were discussed and addressed.  No physical exam was performed (except for noted visual exam findings with Video Visits).  Please refer to the patient's chart (MyChart message for video visits and phone note for telephone visits) for the patient's consent to telehealth for [place]  Date:  11/15/2019   ID:  Brenda Davis, DOB 02/22/1965, MRN [id number]  Patient Location:  [place] [id number]   Provider location:     [address] Office [phone] Fax [phone]   PCP:  [name], MD  Cardiologist:  Skeet Latch, MD  Electrophysiologist:  None   Chief Complaint: Follow-up for hypertension/palpitations  History of Present Illness:    Brenda Davis is a 54 y.o. female who  presents via audio/video conferencing for a telehealth visit today.  Patient verified DOB and address.  Ms. Brenda Davis a past medical history of NSTEMI with cardiac catheterization on 01/17/2019. She had PCI with DES x2 to the proximalRCA and first marginal. Her mid LAD was also 75% stenosed, this will be managed medically. She also had 40% mid LAD, mid circumflex and distal circumflex lesions 55 and 40% stenosed. She also has dyslipidemia, morbid obesity, history of acute respiratory failure with hypoxemia, HTN, ESRD on PD and history of hyperkalemia.  She presented to the hospital emergency department on 01/15/2019 for an evaluation of her chest pain. She had reported that for the prior 2 weeks she been having fatigue. Over that Thursday and Friday she also noticed decrease exercise tolerance her chest pain was nitro responsive. She noticed that her chest pain was absent on Saturday and reappeared on Sunday. She stated that her chest pain further progressed to substernal and left-sided chest pain with a stabbing type sensation. She also had associated nausea diaphoresis, and shortness of breath. She again took nitroglycerin x1 and her pain became worse. She took another nitroglycerin and her pain continued to be 10/10. Her husband called 23, she took 4 baby aspirin and a third nitroglycerin which helped her pain decrease. Her presentation was similar to her presentation 06/2018. Her LHC at that time showed no clear culprit lesions and she did not tolerate isosorbide. She felt that it made her chest pain worse, she also developed diaphoresis with the medication.  She presented to the clinic 02/01/19 and statedshe was not able to tolerate her metoprolol tartrate due to low blood pressures.  She also decreased the amount of fluid she pulls with each dialysis session to 500 mL nightly. Her blood pressure was 88/64. She stated she had been sedentary since her cardiac catheterization  due to her incision with staples. She did light walking in her house and some housework. She had questions about her diet and her husband stated they have been eating packaged/processed foods such as canned spaghetti O's. She had an appointment with nephrology on 02/02/2019. I  gave him salty 6 she, refilled her Brilinta and Crestor, and asked her to increase her physical activity as tolerated.  She subsequently developed an ulcer and presented back to the emergency department on 03/11/2019 with unstable angina.  She is found to have a stent thrombosis of her OM stent.  It was not amenable to PCI medical management was recommended.  During her hospitalization she developed Covid pneumonia and sepsis.  She initially tested negative for Covid but subsequently tested positive.  Her husband also tested positive and therefore it was not found to be nosocomial.  She was treated with remdesivir and steroids.  She also had blood cultures that were positive for staph capitis which she received vancomycin and cefepime for.  She was transitioned to doxycycline.  She was last seen by Jory Sims, DNP on 08/02/2019.  She was found to be anxious about her heart rate and blood pressure at that time.  She noted that 2 days prior awake her heart rate had been up to 103-110-she had been sitting on the couch.  She described nauseousness and feelings of indigestion.  She did have some diaphoresis.  She indicated that she got up from the couch and walked to her bedroom.  She noted dyspnea along with near syncope.  Her husband took her blood pressure and found it to be 189/102 with a heart rate of 108.  She retook her blood pressure 2 to 3 minutes later and noticed it was slowly coming down heart rate increased to 98 bpm.  She indicated that she has had another episode of tachycardia and hypertension while at dialysis.  When she was seen by Jory Sims, DNP in follow-up she indicated that she had been compliant with her  medicines had not had any recurrent episodes of rapid heart rate or high blood pressure and denied bleeding, bruising, and myalgia.  Her nephrologist Dr. Ronnald Collum removed her HD catheter due to frequent infections and she was placed a left brachial AV fistula for HD which was working well.  She was being trained on home dialysis at that time.  She was seen virtually 09/20/2019  for follow-up evaluation and stated she had been increasing her physical activity with more walking.  She stated that she had better stamina at the time.  When asked about her cardiac event monitor she stated that she was only able to complete 3 days prior to needing a surgery.  At that time she was instructed to remove the monitor.  She stated that during the monitoring time she did not have any episodes of accelerated/palpitation.  However, she had noticed episodes of palpitations lasting 20-30 minutes, increased heart rate, and experienced trouble sleeping several times per week.  She denied dizziness, increased fatigue, and chest discomfort.  She had  been doing home dialysis for 2 to 3 weeks and did not anticipate any interruptions in her cardiac monitoring.  I  prescribed diltiazem 120 mg, repeated her 14-day ZIO monitor, and had her avoid triggers for palpitations.  Plannedfollow-up for 8 weeks.  She was seen by Dr. Oval Linsey on 10/19/2019.  During that time her blood pressure was elevated in the 003K systolic.  Her Isodril was increased to 20 mg twice daily, carvedilol increased to 25 mg twice daily, and her rosuvastatin was increased to 20 mg daily.  She reported some palpitations and tachycardia.  Her cardiac event monitor showed 6 beats of SVT and rare PVCs.  She indicated that her home blood pressures have been running high in the 170s during hemodialysis.  She indicated that she had been having chest pain associated with her high blood pressure.  Her pain was relieved by using sublingual nitroglycerin.  She also indicated that she  had been feeling short of breath with walks.  She also stated that her heart rate would race and she became stressed or excited.  She is seen virtually today in follow-up and states she is doing well.  She has been increasing her physical activity and is now walking around 45 minutes 3-4 times a week.  She states she goes to the super [employer] and walks with a shopping cart and she has also been walking on the [place] in the mornings.  Her blood pressure has been much better controlled in the 120s over 70s after her medication changes.  She states that she continues to have intermittent brief chest discomfort that she notices with increased heart rate and increased blood pressure.  Her symptoms are relieved with rest, are brief and occasional.  Her and her husband have been managing her home dialysis well and are becoming more comfortable with the process.  I will order follow-up lipid liver panel, give the salty 6 diet instructions, have her continue to increase her physical activity as tolerated and follow-up in 3 months.  Shedenies chest pain, shortness of breath, lower extremity edema, fatigue, melena, hematuria, hemoptysis, diaphoresis, weakness, syncope, orthopnea, and PND.  The patient does not symptoms concerning for COVID-19 infection (fever, chills, cough, or new SHORTNESS OF BREATH).    Prior CV studies:   The following studies were reviewed today:  Echocardiogram 01/04/2015 Study Conclusions  - Left ventricle: The cavity size was normal. Wall thickness was normal. Systolic function was vigorous. The estimated ejection fraction was in the range of 65% to 70%. Features are consistent with a pseudonormal left ventricular filling pattern, with concomitant abnormal relaxation and increased filling pressure (grade 2 diastolic dysfunction). - Mitral valve: There was mild regurgitation. - Pericardium, extracardiac: A trivial pericardial effusion was identified.   48  Hour Holter Monitor 10/08/2016:  Quality: Fair. Baseline artifact. Predominant rhythm: sinus rhythm Average heart rate: 64 bpm Max heart rate: 94 bpm Min heart rate: 50 bpm  Occasional PACs were noted that did not correspond with the fluttering reported by the patient.  LHC 06/20/18: Conclusions: 1. Moderately severe multivessel coronary artery disease not significantly changed since prior catheterization in 02/2018. DFR of the most severe lesion in the RCA at that time was not hemodynamically significant. 2. Normal left ventricular filling pressure. 3. Hyperdynamic left ventricular contraction.  Recommendations: 1. Medical therapy for moderate diffuse coronary artery disease. There is no clear culprit for PCI. I suspect troponin elevation reflects supply-demand mismatch. I will start isosorbide mononitrate 30 mg daily, which can be escalated as blood pressure tolerates for relief of chest pain. 2. Aggressive secondary prevention, including high-intensity statin therapy. 3. If no evidence of bleeding complications and no recurrence of chest pain, discharge this afternoon or tomorrow could be considered from a cardiac standpoint.  Cardiac catheterization 01/17/2019  Mid LAD lesion is 75% stenosed. The LAD was not addressed and will be managed medically.  Ost LAD to Mid LAD lesion is 40% stenosed.  Mid Cx to Dist Cx lesion is 55% stenosed with 40% stenosed side branch in Ost 3rd Mrg.  Prox RCA lesion is 75% stenosed.  A drug-eluting stent was successfully placed using a SYNERGY XD 3.0X38, postdilated to 3.5 mm.  Post intervention, there is a 0% residual stenosis.  Ost 1st Mrg lesion is 99% stenosed. EBU4 Guide catheter used.  A drug-eluting stent was successfully placed using a SYNERGY XD 2.25X24, postdilated to 2.75 mm.  Post intervention, there is a 0% residual stenosis.  Continue DAPT for 12 months. Would recommend clopidogrel long-term after one year since she  has such diffuse disease. Continue aggressive secondary prevention.  Cardiac Cath 03/03/19  Mid LAD lesion is 75% stenosed.  Ost LAD to Mid LAD lesion is 40% stenosed.  Previously placed Ost 1st Mrg drug eluting stent is widely patent.  Mid Cx to Dist Cx lesion is 55% stenosed with 40% stenosed side branch in Ost 3rd Mrg.  Previously placed Prox RCA drug eluting stent is widely patent.  Ost 1st Mrg to 1st Mrg lesion is 99% stenosed. Partially in the stent and partially distal to the stent- subacte stent thrombosis.  LV end diastolic pressure is normal.  There is no aortic valve stenosis. Medical therapy for stent thrombosis of the OM stent. Given the severe tortuosity, I don't think repeat PCI attempt would be beneficial. There was significant difficulty getting a stent delivered during the last cath. I think there would be a high likelihood of completely occluding the vessel with wire manipulation.  Echocardiogram 01/16/2019 IMPRESSIONS 1. Left ventricular ejection fraction, by visual estimation, is 65 to 70%. The left ventricle has hyperdynamic function. There is moderately increased left ventricular hypertrophy. 2. Definity contrast agent was given IV to delineate the left ventricular endocardial borders. 3. Left ventricular diastolic parameters are consistent with Grade I diastolic dysfunction (impaired relaxation). 4. The left ventricle has no regional wall motion abnormalities. 5. Global right ventricle has normal systolic function.The right ventricular size is mildly enlarged. No increase in right ventricular wall thickness. 6. Left atrial size was normal. 7. Right atrial size was normal. 8. The pericardial effusion is posterior to the left ventricle. 9. Trivial pericardial effusion is present. 10. The mitral valve was not well visualized. Trivial mitral valve regurgitation. 11. The tricuspid valve is not well visualized. 12. The aortic valve is tricuspid.  Aortic valve regurgitation is not visualized. Mild aortic valve sclerosis without stenosis. 13. The pulmonic valve was grossly normal. Pulmonic valve regurgitation is not visualized. 14. The inferior vena cava is normal in size with <50% respiratory variability, suggesting right atrial pressure of 8 mmHg. 15. The interatrial septum was not well visualized.  14 Day Event Monitor (worn for 3 days)  Quality: Fair. Baseline artifact. Predominant rhythm: sinus rhythm Average heart rate: 79 bpm Max heart rate: 108 bpm Min heart rate: 58 bpm  One PVC No significant arrhythmias  Tiffany C. Oval Linsey, MD, [facility] 09/15/2019  Past Medical History:  Diagnosis Date  . Anemia   . CAD (coronary artery disease)    a. cath in 02/2018 showing moderate 3-vessel CAD with 45% mid-LADm 55% LCx and 65% RCA stenosis which was not significant by FFR  . ESRD on dialysis ([place])   . Gout   . HCAP (healthcare-associated pneumonia) 05/06/2016  . History of blood  transfusion    "related to surgery"  . Hyperlipidemia   . Hypertension   . Morbid obesity ([place])   . Pain    LEFT SHOULDER PAIN - WAS SEEN AT AN URGENT CARE - GIVEN SLING FOR COMFORT AND TOLD ROM AS TOLERATED.  Marland Kitchen Palpitations 09/24/2016  . Peritonitis, dialysis-associated ([place])   . Type II diabetes mellitus ([place])    "gastric sleeve OR corrected this" (05/06/2016)   Past Surgical History:  Procedure Laterality Date  . AV FISTULA PLACEMENT Left 01/03/2015   Procedure: BRACHIAL CEPHALIC ARTERIOVENOUS  FISTULA CREATION LEFT ARM;  Surgeon: Angelia Mould, MD;  Location: [place];  Service: Vascular;  Laterality: Left;  . CARPAL TUNNEL RELEASE Right   . [place]  . CHOLECYSTECTOMY  09/14/2017  . CORONARY STENT INTERVENTION N/A 01/17/2019   Procedure: CORONARY STENT INTERVENTION;  Surgeon: Jettie Booze, MD;  Location: [facility];  Service: Cardiovascular;  Laterality: N/A;  . ESOPHAGOGASTRODUODENOSCOPY N/A  09/04/2012   Procedure: ESOPHAGOGASTRODUODENOSCOPY (EGD);  Surgeon: Shann Medal, MD;  Location: [facility];  Service: General;  Laterality: N/A;  PF  . ESOPHAGOGASTRODUODENOSCOPY (EGD) WITH ESOPHAGEAL DILATION N/A 09/29/2012   Procedure: ESOPHAGOGASTRODUODENOSCOPY (EGD) WITH ESOPHAGEAL DILATION;  Surgeon: Milus Banister, MD;  Location: [facility];  Service: Endoscopy;  Laterality: N/A;  . FEMORAL ARTERY EXPLORATION Right 01/17/2019   Procedure: Exploration Right Groin with Primary Closure or Arteriotomy Site; Washout of Retroperitoneal Hematoma;  Surgeon: Waynetta Sandy, MD;  Location: [place];  Service: Vascular;  Laterality: Right;  . INSERTION OF DIALYSIS CATHETER N/A 01/03/2015   Procedure: INSERTION OF DIALYSIS CATHETER RIGHT INTERNAL JUGULAR;  Surgeon: Angelia Mould, MD;  Location: [place];  Service: Vascular;  Laterality: N/A;  . LAPAROSCOPIC GASTRIC SLEEVE RESECTION N/A 07/19/2012   Procedure: LAPAROSCOPIC SLEEVE GASTRECTOMY with EGD;  Surgeon: Madilyn Hook, DO;  Location: [facility];  Service: General;  Laterality: N/A;  laparoscopic sleeve gastrectomy with EGD  . LEFT HEART CATH AND CORONARY ANGIOGRAPHY N/A 02/25/2018   Procedure: LEFT HEART CATH AND CORONARY ANGIOGRAPHY;  Surgeon: Leonie Man, MD;  Location: [place];  Service: Cardiovascular;  Laterality: N/A;  . LEFT HEART CATH AND CORONARY ANGIOGRAPHY N/A 06/20/2018   Procedure: LEFT HEART CATH AND CORONARY ANGIOGRAPHY;  Surgeon: Nelva Bush, MD;  Location: [facility] [facility];  Service: Cardiovascular;  Laterality: N/A;  . LEFT HEART CATH AND CORONARY ANGIOGRAPHY N/A 01/16/2019   Procedure: LEFT HEART CATH AND CORONARY ANGIOGRAPHY;  Surgeon: Jettie Booze, MD;  Location: [facility];  Service: Cardiovascular;  Laterality: N/A;  . LEFT HEART CATH AND CORONARY ANGIOGRAPHY N/A 03/03/2019   Procedure: LEFT HEART CATH AND CORONARY ANGIOGRAPHY;  Surgeon: Jettie Booze, MD;  Location: [place] [facility];  Service: Cardiovascular;  Laterality: N/A;  . PERIPHERAL VASCULAR BALLOON ANGIOPLASTY Left 08/18/2019   Procedure: PERIPHERAL VASCULAR BALLOON ANGIOPLASTY;  Surgeon: Marty Heck, MD;  Location: [facility] [facility];  Service: Cardiovascular;  Laterality: Left;  arm fistula  . PERIPHERAL VASCULAR CATHETERIZATION Left 05/23/2015   Procedure: Nolon Stalls;  Surgeon: Conrad Middletown, MD;  Location: [facility];  Service: Cardiovascular;  Laterality: Left;  upper aRM  . PERIPHERAL VASCULAR CATHETERIZATION Left 05/23/2015   Procedure: Peripheral Vascular Balloon Angioplasty;  Surgeon: Conrad Draper, MD;  Location: [facility];  Service: Cardiovascular;  Laterality: Left;  av fistula  . TUBAL LIGATION  1993  . UPPER GI ENDOSCOPY  N/A 07/19/2012   Procedure: UPPER GI ENDOSCOPY;  Surgeon: Madilyn Hook, DO;  Location: [facility];  Service: General;  Laterality: N/A;     Current Meds  Medication Sig  . acetaminophen (TYLENOL) 500 MG tablet Take 1,000 mg by mouth every 6 (six) hours as needed for moderate pain or headache.  Marland Kitchen aspirin EC 81 MG EC tablet Take 1 tablet (81 mg total) by mouth daily.  . B Complex-C-Biotin-D-FA (DIALYVITE 800 PLUS D) 800 MCG WAFR Take by mouth.  . calcitRIOL (ROCALTROL) 0.5 MCG capsule Take 1.5 mcg by mouth daily.   . calcium acetate (PHOSLO) 667 MG capsule Take 667-2,001 mg by mouth See admin instructions. Take 2001 mg with each meal and 667 mg with each snack  . carvedilol (COREG) 25 MG tablet Take 12.5 mg by mouth 2 (two) times daily.   . folic acid (FOLVITE) 1 MG tablet Take 1 tablet (1 mg total) by mouth daily.  Marland Kitchen gabapentin (NEURONTIN) 100 MG capsule Take 100 mg by mouth 2 (two) times daily.  . isosorbide dinitrate (ISORDIL) 20 MG tablet Take 1 tablet (20 mg total) by mouth 2 (two) times daily.  . Methoxy PEG-Epoetin Beta (MIRCERA IJ) Inject into the skin.  . mupirocin ointment (BACTROBAN) 2 % Apply topically.  . nitroGLYCERIN (NITROSTAT) 0.4 MG  SL tablet TAKE 1 TABLET BY MOUTH UNDER THE TONGUE EVERY 5 MINUTES AS NEEDED FOR CHEST PAIN (Patient taking differently: Place 0.4 mg under the tongue every 5 (five) minutes as needed for chest pain. )  . rosuvastatin (CRESTOR) 20 MG tablet Take 1 tablet (20 mg total) by mouth daily.  . ticagrelor (BRILINTA) 90 MG TABS tablet Take 1 tablet (90 mg total) by mouth 2 (two) times daily.   Current Facility-Administered Medications for the 11/15/19 encounter (Telemedicine) with Deberah Pelton, NP  Medication  . 0.9 %  sodium chloride infusion  . sodium chloride flush (NS) 0.9 % injection 3 mL  . sodium chloride flush (NS) 0.9 % injection 3 mL     Allergies:   Amlodipine, Atorvastatin, Clonidine derivatives, Doxycycline, and Welchol [colesevelam hcl]   Social History   Tobacco Use  . Smoking status: Former Smoker    Packs/day: 1.00    Years: 16.00    Pack years: 16.00    Types: Cigarettes    Quit date: 2009    Years since quitting: 12.8  . Smokeless tobacco: Never Used  Vaping Use  . Vaping Use: Never used  Substance Use Topics  . Alcohol use: Never  . Drug use: No     Family Hx: The patient's family history includes Arthritis in an other family member; Diabetes in her father; Hyperlipidemia in an other family member; Hypertension in her mother; Mitral valve prolapse in her mother. There is no history of Cancer, Heart disease, Kidney disease, or Stroke.  ROS:   Please see the history of present illness.     All other systems reviewed and are negative.   Labs/Other Tests and Data Reviewed:    Recent Labs: 03/14/2019: ALT 14 04/04/2019: Magnesium 1.9; Platelets 333.0 08/18/2019: BUN 26; Creatinine, Ser 6.80; Hemoglobin 12.6; Potassium 4.4; Sodium 136   Recent Lipid Panel Lab Results  Component Value Date/Time   CHOL 185 01/16/2019 04:49 AM   CHOL 191 12/26/2018 10:22 AM   TRIG 150 (H) 01/16/2019 04:49 AM   HDL 39 (L) 01/16/2019 04:49 AM   HDL 44 12/26/2018 10:22 AM    CHOLHDL 4.7 01/16/2019 04:49 AM   LDLCALC  116 (H) 01/16/2019 04:49 AM   LDLCALC 109 (H) 12/26/2018 10:22 AM   LDLDIRECT 161.6 11/28/2012 10:42 AM    Wt Readings from Last 3 Encounters:  11/15/19 278 lb 14.1 oz (126.5 kg)  10/19/19 278 lb (126.1 kg)  09/20/19 276 lb (125.2 kg)     Exam:    Vital Signs:  BP (!) 141/82   Pulse 76   Wt 278 lb 14.1 oz (126.5 kg)   BMI 46.41 kg/m    Well nourished, well developed female in no  acute distress.   ASSESSMENT & PLAN:    1.  Tachycardia intermittent/palpitations -heart rate today 76 bpm.  Cardiac event monitor showed predominantly normal sinus rhythm with an average heart rate of 79 bpm, 1 PVC no significant arrhythmias.    Felt to be related to obesity secondary to OSA.   Continue carvedilol Heart healthy low-sodium diet-salty 6 given Increase physical activity as tolerated Avoid triggers caffeine, chocolate, EtOH etc.  Essential hypertension-BP today 141/82, had not taken her blood pressure medication yet.    Much better controlled with recent medication increases ranging in the 120s over 70s.   Midodrine discontinued. Continue carvedilol, Imdur, Isodril Heart healthy low-sodium diet-salty 6 given Increase physical activity as tolerated  Coronary artery disease-has occasional brief episodes of chest discomfort.  Seem to be associated with elevated blood pressures.  She has been increasing her physical activity and denies exertional chest discomfort.  Blood pressures much better controlled with recent addition of blood pressure medications 120s over 70s. Status post PCI with DES x2 to proximal RCA and OM.  LAD 70% medically managed.  Underwent repeat LHC 03/11/2019 and found to have in-stent thrombosis of OM stent.  This was not amenable to PCI medical management was recommended. Continue ASA, Brilinta, carvedilol, Heart healthy low-sodium diet-salty 6 given Increase physical activity as tolerated  Hyperlipidemia-01/16/2019:  Cholesterol 185; HDL 39; LDL Cholesterol 116; Triglycerides 150; VLDL 30 Continue rosuvastatin Heart healthy low-sodium high-fiber diet Increase physical activity as tolerated Repeat lipid and liver   Disposition: Follow-up with Dr. Oval Linsey or myself in 3 months.  COVID-19 Education: The signs and symptoms of COVID-19 were discussed with the patient and how to seek care for testing (follow up with PCP or arrange E-visit).  The importance of social distancing was discussed today.  Patient Risk:   After full review of this patients clinical status, I feel that they are at least moderate risk at this time.  Time:   Today, I have spent 15 minutes with the patient with telehealth technology discussing chest pain, hemodialysis, medications, and physical activity.  I spent greater than 20 minutes reviewing patient's past medical history, prior cardiac testing and previous notes.   Medication Adjustments/Labs and Tests Ordered: Current medicines are reviewed at length with the patient today.  Concerns regarding medicines are outlined above.   Tests Ordered: No orders of the defined types were placed in this encounter.  Medication Changes: No orders of the defined types were placed in this encounter.   Disposition:  in 3 month(s)  Signed, Jossie Ng. Tennie Grussing NP-C    08/23/2018 11:58 AM    [place] [address] Office [phone] Fax [phone]

## 2019-11-15 ENCOUNTER — Encounter: Payer: Self-pay | Admitting: General Practice

## 2019-11-15 ENCOUNTER — Telehealth (INDEPENDENT_AMBULATORY_CARE_PROVIDER_SITE_OTHER): Payer: BC Managed Care – PPO | Admitting: General Practice

## 2019-11-15 VITALS — BP 141/82 | HR 76 | Wt 278.9 lb

## 2019-11-15 DIAGNOSIS — Z79899 Other long term (current) drug therapy: Secondary | ICD-10-CM | POA: Diagnosis not present

## 2019-11-15 DIAGNOSIS — E785 Hyperlipidemia, unspecified: Secondary | ICD-10-CM

## 2019-11-15 DIAGNOSIS — I25110 Atherosclerotic heart disease of native coronary artery with unstable angina pectoris: Secondary | ICD-10-CM | POA: Diagnosis not present

## 2019-11-15 DIAGNOSIS — I10 Essential (primary) hypertension: Secondary | ICD-10-CM

## 2019-11-15 DIAGNOSIS — R002 Palpitations: Secondary | ICD-10-CM

## 2019-11-15 NOTE — Patient Instructions (Signed)
Medication Instructions:  The current medical regimen is effective;  continue present plan and medications as directed. Please refer to the Current Medication list given to you today. *If you need a refill on your cardiac medications before your next appointment, please call your pharmacy*  Lab Work: [place] If you have labs (blood work) drawn today and your tests are completely normal, you will receive your results only by:  [place] (if you have MyChart) OR A paper copy in the mail.  If you have any lab test that is abnormal or we need to change your treatment, we will call you to review the results. You may go to any [facility] that is convenient for you however, we do have a lab in our office that is able to assist you. You DO NOT need an appointment for our lab. The lab is open 8:00am and closes at 4:00pm. Lunch 12:45 - 1:45pm.  Special Instructions TAKE DAILY WEIGHTS  PLEASE READ AND FOLLOW SALTY 6-ATTACHED  PLEASE INCREASE PHYSICAL ACTIVITY AS TOLERATED  Follow-Up: Your next appointment:  3 month(s) In Person with Skeet Latch, MD -[place], FNP-C   At [facility], you and your health needs are our priority.  As part of our continuing mission to provide you with exceptional heart care, we have created designated Provider Care Teams.  These Care Teams include your primary Cardiologist (physician) and Advanced Practice Providers (APPs -  Physician Assistants and Nurse Practitioners) who all work together to provide you with the care you need, when you need it.  We recommend signing up for the patient portal called "MyChart".  Sign up information is provided on this After Visit Summary.  MyChart is used to connect with patients for Virtual Visits (Telemedicine).  Patients are able to view lab/test results, encounter notes, upcoming appointments, etc.  Non-urgent messages can be sent to your provider as well.   To learn more about what you can do with  MyChart, go to [email or web address].

## 2019-12-21 ENCOUNTER — Telehealth (HOSPITAL_COMMUNITY): Payer: Self-pay

## 2019-12-21 NOTE — Telephone Encounter (Signed)
Called and spoke with Levada Dy at [facility] [phone], who provided a diff fax number for pt clearance letter to be sent to. Refax letter to [phone] and placed pt ppw in fax awaiting folder.

## 2020-01-05 ENCOUNTER — Ambulatory Visit
Admission: EM | Admit: 2020-01-05 | Discharge: 2020-01-05 | Disposition: A | Discharge status: Home / Self Care | Payer: Medicare Other | Attending: Urgent Care | Admitting: Urgent Care

## 2020-01-05 ENCOUNTER — Other Ambulatory Visit: Payer: Self-pay

## 2020-01-05 DIAGNOSIS — R490 Dysphonia: Secondary | ICD-10-CM

## 2020-01-05 DIAGNOSIS — R0989 Other specified symptoms and signs involving the circulatory and respiratory systems: Secondary | ICD-10-CM

## 2020-01-05 DIAGNOSIS — N186 End stage renal disease: Secondary | ICD-10-CM

## 2020-01-05 DIAGNOSIS — J069 Acute upper respiratory infection, unspecified: Secondary | ICD-10-CM

## 2020-01-05 MED ORDER — CETIRIZINE HCL 5 MG PO TABS: ORAL_TABLET | ORAL | 0 refills | Status: DC

## 2020-01-05 MED ORDER — BENZONATATE 100 MG PO CAPS
100.0000 mg | ORAL_CAPSULE | Freq: Two times a day (BID) | ORAL | 0 refills | Status: DC | PRN
Start: 2020-01-05 — End: 2020-02-23

## 2020-01-05 NOTE — ED Provider Notes (Signed)
[place]   MRN: [id number] DOB: Dec 23, 1965  Subjective:   Brenda Davis is a 54 y.o. female presenting for 2-day history of runny nose, scratchy throat, started having hoarseness and dry cough today yesterday, respectively.  Has a history of asthma.  Is also diabetic, is on dialysis.  Denies history of COVID-19.  She is not vaccinated.  Denies chest pain, shortness of breath, body aches.   Current Facility-Administered Medications:  .  0.9 %  sodium chloride infusion, 250 mL, Intravenous, PRN, Angelia Mould, MD .  sodium chloride flush (NS) 0.9 % injection 3 mL, 3 mL, Intravenous, Q12H, Angelia Mould, MD .  sodium chloride flush (NS) 0.9 % injection 3 mL, 3 mL, Intravenous, PRN, Angelia Mould, MD  Current Outpatient Medications:  .  acetaminophen (TYLENOL) 500 MG tablet, Take 1,000 mg by mouth every 6 (six) hours as needed for moderate pain or headache., Disp: , Rfl:  .  aspirin EC 81 MG EC tablet, Take 1 tablet (81 mg total) by mouth daily., Disp: 30 tablet, Rfl: 0 .  B Complex-C-Biotin-D-FA (DIALYVITE 800 PLUS D) 800 MCG WAFR, Take by mouth., Disp: , Rfl:  .  calcitRIOL (ROCALTROL) 0.5 MCG capsule, Take 1.5 mcg by mouth daily. , Disp: , Rfl:  .  calcium acetate (PHOSLO) 667 MG capsule, Take 667-2,001 mg by mouth See admin instructions. Take 2001 mg with each meal and 667 mg with each snack, Disp: , Rfl:  .  carvedilol (COREG) 25 MG tablet, Take 12.5 mg by mouth 2 (two) times daily. , Disp: , Rfl:  .  diltiazem (CARDIZEM CD) 120 MG 24 hr capsule, Take 1 capsule (120 mg total) by mouth daily. Take after Lunch (Patient not taking: Reported on 11/15/2019), Disp: 30 capsule, Rfl: 3 .  ferric citrate (AURYXIA) 1 GM 210 MG(Fe) tablet, Take 2 tablets (420 mg total) by mouth 3 (three) times daily with meals. (Patient not taking: Reported on 11/15/2019), Disp: 270 tablet, Rfl: 0 .  folic acid (FOLVITE) 1 MG tablet, Take 1 tablet (1 mg total)  by mouth daily., Disp: 90 tablet, Rfl: 1 .  gabapentin (NEURONTIN) 100 MG capsule, Take 100 mg by mouth 2 (two) times daily., Disp: , Rfl:  .  isosorbide dinitrate (ISORDIL) 20 MG tablet, Take 1 tablet (20 mg total) by mouth 2 (two) times daily., Disp: 180 tablet, Rfl: 3 .  Methoxy PEG-Epoetin Beta (MIRCERA IJ), Inject into the skin., Disp: , Rfl:  .  mupirocin ointment (BACTROBAN) 2 %, Apply topically., Disp: , Rfl:  .  nitroGLYCERIN (NITROSTAT) 0.4 MG SL tablet, TAKE 1 TABLET BY MOUTH UNDER THE TONGUE EVERY 5 MINUTES AS NEEDED FOR CHEST PAIN (Patient taking differently: Place 0.4 mg under the tongue every 5 (five) minutes as needed for chest pain. ), Disp: 50 tablet, Rfl: 2 .  rosuvastatin (CRESTOR) 20 MG tablet, Take 1 tablet (20 mg total) by mouth daily., Disp: 90 tablet, Rfl: 3 .  ticagrelor (BRILINTA) 90 MG TABS tablet, Take 1 tablet (90 mg total) by mouth 2 (two) times daily., Disp: 180 tablet, Rfl: 3   Allergies  Allergen Reactions  . Amlodipine Swelling  . Atorvastatin     Elevated LFT's  . Clonidine Derivatives Swelling    Limbs swell  . Doxycycline Nausea And Vomiting    "I threw up for 3 hours"  . Welchol [Colesevelam Hcl] Nausea Only    Past Medical History:  Diagnosis Date  . Anemia   . CAD (coronary  artery disease)    a. cath in 02/2018 showing moderate 3-vessel CAD with 45% mid-LADm 55% LCx and 65% RCA stenosis which was not significant by FFR  . ESRD on dialysis ([facility])   . Gout   . HCAP (healthcare-associated pneumonia) 05/06/2016  . History of blood transfusion    "related to surgery"  . Hyperlipidemia   . Hypertension   . Morbid obesity ([place])   . Pain    LEFT SHOULDER PAIN - WAS SEEN AT AN URGENT CARE - GIVEN SLING FOR COMFORT AND TOLD ROM AS TOLERATED.  Marland Kitchen Palpitations 09/24/2016  . Peritonitis, dialysis-associated ([place])   . Type II diabetes mellitus ([place])    "gastric sleeve OR corrected this" (05/06/2016)     Past Surgical History:  Procedure Laterality Date   . AV FISTULA PLACEMENT Left 01/03/2015   Procedure: BRACHIAL CEPHALIC ARTERIOVENOUS  FISTULA CREATION LEFT ARM;  Surgeon: Angelia Mould, MD;  Location: [facility];  Service: Vascular;  Laterality: Left;  . CARPAL TUNNEL RELEASE Right   . [place]  . CHOLECYSTECTOMY  09/14/2017  . CORONARY STENT INTERVENTION N/A 01/17/2019   Procedure: CORONARY STENT INTERVENTION;  Surgeon: Jettie Booze, MD;  Location: [facility];  Service: Cardiovascular;  Laterality: N/A;  . ESOPHAGOGASTRODUODENOSCOPY N/A 09/04/2012   Procedure: ESOPHAGOGASTRODUODENOSCOPY (EGD);  Surgeon: Shann Medal, MD;  Location: [facility];  Service: General;  Laterality: N/A;  PF  . ESOPHAGOGASTRODUODENOSCOPY (EGD) WITH ESOPHAGEAL DILATION N/A 09/29/2012   Procedure: ESOPHAGOGASTRODUODENOSCOPY (EGD) WITH ESOPHAGEAL DILATION;  Surgeon: Milus Banister, MD;  Location: [facility];  Service: Endoscopy;  Laterality: N/A;  . FEMORAL ARTERY EXPLORATION Right 01/17/2019   Procedure: Exploration Right Groin with Primary Closure or Arteriotomy Site; Washout of Retroperitoneal Hematoma;  Surgeon: Waynetta Sandy, MD;  Location: [facility];  Service: Vascular;  Laterality: Right;  . INSERTION OF DIALYSIS CATHETER N/A 01/03/2015   Procedure: INSERTION OF DIALYSIS CATHETER RIGHT INTERNAL JUGULAR;  Surgeon: Angelia Mould, MD;  Location: [facility];  Service: Vascular;  Laterality: N/A;  . LAPAROSCOPIC GASTRIC SLEEVE RESECTION N/A 07/19/2012   Procedure: LAPAROSCOPIC SLEEVE GASTRECTOMY with EGD;  Surgeon: Madilyn Hook, DO;  Location: [facility];  Service: General;  Laterality: N/A;  laparoscopic sleeve gastrectomy with EGD  . LEFT HEART CATH AND CORONARY ANGIOGRAPHY N/A 02/25/2018   Procedure: LEFT HEART CATH AND CORONARY ANGIOGRAPHY;  Surgeon: Leonie Man, MD;  Location: [facility];  Service: Cardiovascular;  Laterality: N/A;  . LEFT HEART CATH AND CORONARY ANGIOGRAPHY N/A 06/20/2018   Procedure:  LEFT HEART CATH AND CORONARY ANGIOGRAPHY;  Surgeon: Nelva Bush, MD;  Location: [facility];  Service: Cardiovascular;  Laterality: N/A;  . LEFT HEART CATH AND CORONARY ANGIOGRAPHY N/A 01/16/2019   Procedure: LEFT HEART CATH AND CORONARY ANGIOGRAPHY;  Surgeon: Jettie Booze, MD;  Location: [facility];  Service: Cardiovascular;  Laterality: N/A;  . LEFT HEART CATH AND CORONARY ANGIOGRAPHY N/A 03/03/2019   Procedure: LEFT HEART CATH AND CORONARY ANGIOGRAPHY;  Surgeon: Jettie Booze, MD;  Location: [facility];  Service: Cardiovascular;  Laterality: N/A;  . PERIPHERAL VASCULAR BALLOON ANGIOPLASTY Left 08/18/2019   Procedure: PERIPHERAL VASCULAR BALLOON ANGIOPLASTY;  Surgeon: Marty Heck, MD;  Location: [facility];  Service: Cardiovascular;  Laterality: Left;  arm fistula  . PERIPHERAL VASCULAR CATHETERIZATION Left 05/23/2015   Procedure: Nolon Stalls;  Surgeon: Conrad Chitina, MD;  Location: [facility];  Service: Cardiovascular;  Laterality: Left;  upper aRM  . PERIPHERAL VASCULAR CATHETERIZATION Left 05/23/2015   Procedure: Peripheral Vascular Balloon Angioplasty;  Surgeon: Conrad Lizton, MD;  Location: [facility];  Service: Cardiovascular;  Laterality: Left;  av fistula  . TUBAL LIGATION  1993  . UPPER GI ENDOSCOPY N/A 07/19/2012   Procedure: UPPER GI ENDOSCOPY;  Surgeon: Madilyn Hook, DO;  [facility];  Service: General;  Laterality: N/A;    Family History  Problem Relation Age of Onset  . Hypertension Mother   . Mitral valve prolapse Mother   . Diabetes Father   . Arthritis Other   . Hyperlipidemia Other   . Cancer Neg Hx   . Heart disease Neg Hx   . Kidney disease Neg Hx   . Stroke Neg Hx     Social History   Tobacco Use  . Smoking status: Former Smoker    Packs/day: 1.00    Years: 16.00    Pack years: 16.00    Types: Cigarettes    Quit date: 2009    Years since quitting: 12.9  . Smokeless tobacco: Never Used   Vaping Use  . Vaping Use: Never used  Substance Use Topics  . Alcohol use: Never  . Drug use: No    ROS   Objective:   Vitals: BP 120/77 (BP Location: Right Arm)   Pulse 76   Temp 98 F (36.7 C) (Oral)   Resp 20   SpO2 98%   Physical Exam Constitutional:      General: She is not in acute distress.    Appearance: Normal appearance. She is well-developed. She is not ill-appearing, toxic-appearing or diaphoretic.  HENT:     Head: Normocephalic and atraumatic.     Nose: Nose normal.     Mouth/Throat:     Mouth: Mucous membranes are moist.     Comments: Hoarseness of voice. Eyes:     Extraocular Movements: Extraocular movements intact.     Pupils: Pupils are equal, round, and reactive to light.  Cardiovascular:     Rate and Rhythm: Normal rate and regular rhythm.     Pulses: Normal pulses.     Heart sounds: Normal heart sounds. No murmur heard. No friction rub. No gallop.   Pulmonary:     Effort: Pulmonary effort is normal. No respiratory distress.     Breath sounds: Normal breath sounds. No stridor. No wheezing, rhonchi or rales.  Skin:    General: Skin is warm and dry.     Findings: No rash.  Neurological:     Mental Status: She is alert and oriented to person, place, and time.  Psychiatric:        Mood and Affect: Mood normal.        Behavior: Behavior normal.        Thought Content: Thought content normal.        Judgment: Judgment normal.     Assessment and Plan :   PDMP not reviewed this encounter.  1. Viral URI with cough   2. Hoarseness   3. Runny nose   4. ESRD on dialysis [facility])     Will manage for viral illness such as viral URI, viral syndrome, viral rhinitis, COVID-19. Counseled patient on nature of COVID-19 including modes of transmission, diagnostic testing, management and supportive care.  Offered scripts for symptomatic relief that is also safe with ESRD as per UpToDate. COVID 19 testing is pending. Counseled patient on potential for  adverse effects with medications  prescribed/recommended today, ER and return-to-clinic precautions discussed, patient verbalized understanding.     Jaynee Eagles, [place] 01/05/20 [id number]

## 2020-01-05 NOTE — Discharge Instructions (Signed)
We will notify you of your COVID-19 test results as they arrive and may take between 24 to 48 hours.  I encourage you to sign up for MyChart if you have not already done so as this can be the easiest way for [place] to communicate results to you online or through a phone app.  In the meantime, if you develop worsening symptoms including fever, chest pain, shortness of breath despite our current treatment plan then please report to the emergency room as this may be a sign of worsening status from possible COVID-19 infection.  Otherwise, we will manage this as a viral syndrome. For sore throat or cough try using a honey-based tea. Use 3 teaspoons of honey with juice squeezed from half lemon. Place shaved pieces of ginger into 1/2-1 cup of water and warm over stove top. Then mix the ingredients and repeat every 4 hours as needed. Please take Tylenol 564m-650mg every 6 hours for aches and pains, fevers. Hydrate very well with at least 2 liters of water. Eat light meals such as soups to replenish electrolytes and soft fruits, veggies. Start an antihistamine like Zyrtec 3 times weekly for postnasal drainage, sinus congestion.

## 2020-01-05 NOTE — ED Triage Notes (Signed)
Pt c/o runny nose since Wednesday and dry cough on Thursday and loss her voice today.

## 2020-01-07 LAB — SARS-COV-2, NAA 2 DAY TAT

## 2020-01-07 LAB — NOVEL CORONAVIRUS, NAA: SARS-CoV-2, NAA: NOT DETECTED

## 2020-01-20 DIAGNOSIS — N186 End stage renal disease: Secondary | ICD-10-CM | POA: Diagnosis not present

## 2020-01-20 DIAGNOSIS — I509 Heart failure, unspecified: Secondary | ICD-10-CM | POA: Diagnosis not present

## 2020-01-20 DIAGNOSIS — N2581 Secondary hyperparathyroidism of renal origin: Secondary | ICD-10-CM | POA: Diagnosis not present

## 2020-01-20 DIAGNOSIS — Z992 Dependence on renal dialysis: Secondary | ICD-10-CM | POA: Diagnosis not present

## 2020-01-20 DIAGNOSIS — E1122 Type 2 diabetes mellitus with diabetic chronic kidney disease: Secondary | ICD-10-CM | POA: Diagnosis not present

## 2020-01-20 DIAGNOSIS — E8779 Other fluid overload: Secondary | ICD-10-CM | POA: Diagnosis not present

## 2020-01-20 DIAGNOSIS — I132 Hypertensive heart and chronic kidney disease with heart failure and with stage 5 chronic kidney disease, or end stage renal disease: Secondary | ICD-10-CM | POA: Diagnosis not present

## 2020-01-22 DIAGNOSIS — E8779 Other fluid overload: Secondary | ICD-10-CM | POA: Diagnosis not present

## 2020-01-22 DIAGNOSIS — Z4931 Encounter for adequacy testing for hemodialysis: Secondary | ICD-10-CM | POA: Diagnosis not present

## 2020-01-22 DIAGNOSIS — N186 End stage renal disease: Secondary | ICD-10-CM | POA: Diagnosis not present

## 2020-01-22 DIAGNOSIS — E1122 Type 2 diabetes mellitus with diabetic chronic kidney disease: Secondary | ICD-10-CM | POA: Diagnosis not present

## 2020-01-22 DIAGNOSIS — D631 Anemia in chronic kidney disease: Secondary | ICD-10-CM | POA: Diagnosis not present

## 2020-01-22 DIAGNOSIS — I132 Hypertensive heart and chronic kidney disease with heart failure and with stage 5 chronic kidney disease, or end stage renal disease: Secondary | ICD-10-CM | POA: Diagnosis not present

## 2020-01-22 DIAGNOSIS — I509 Heart failure, unspecified: Secondary | ICD-10-CM | POA: Diagnosis not present

## 2020-01-22 DIAGNOSIS — Z992 Dependence on renal dialysis: Secondary | ICD-10-CM | POA: Diagnosis not present

## 2020-01-22 DIAGNOSIS — N2581 Secondary hyperparathyroidism of renal origin: Secondary | ICD-10-CM | POA: Diagnosis not present

## 2020-01-24 DIAGNOSIS — Z992 Dependence on renal dialysis: Secondary | ICD-10-CM | POA: Diagnosis not present

## 2020-01-24 DIAGNOSIS — Z4931 Encounter for adequacy testing for hemodialysis: Secondary | ICD-10-CM | POA: Diagnosis not present

## 2020-01-24 DIAGNOSIS — I509 Heart failure, unspecified: Secondary | ICD-10-CM | POA: Diagnosis not present

## 2020-01-24 DIAGNOSIS — D631 Anemia in chronic kidney disease: Secondary | ICD-10-CM | POA: Diagnosis not present

## 2020-01-24 DIAGNOSIS — N2581 Secondary hyperparathyroidism of renal origin: Secondary | ICD-10-CM | POA: Diagnosis not present

## 2020-01-24 DIAGNOSIS — I132 Hypertensive heart and chronic kidney disease with heart failure and with stage 5 chronic kidney disease, or end stage renal disease: Secondary | ICD-10-CM | POA: Diagnosis not present

## 2020-01-24 DIAGNOSIS — E1122 Type 2 diabetes mellitus with diabetic chronic kidney disease: Secondary | ICD-10-CM | POA: Diagnosis not present

## 2020-01-24 DIAGNOSIS — N186 End stage renal disease: Secondary | ICD-10-CM | POA: Diagnosis not present

## 2020-01-24 DIAGNOSIS — E8779 Other fluid overload: Secondary | ICD-10-CM | POA: Diagnosis not present

## 2020-01-25 DIAGNOSIS — D631 Anemia in chronic kidney disease: Secondary | ICD-10-CM | POA: Diagnosis not present

## 2020-01-25 DIAGNOSIS — E8779 Other fluid overload: Secondary | ICD-10-CM | POA: Diagnosis not present

## 2020-01-25 DIAGNOSIS — I509 Heart failure, unspecified: Secondary | ICD-10-CM | POA: Diagnosis not present

## 2020-01-25 DIAGNOSIS — E1122 Type 2 diabetes mellitus with diabetic chronic kidney disease: Secondary | ICD-10-CM | POA: Diagnosis not present

## 2020-01-25 DIAGNOSIS — Z992 Dependence on renal dialysis: Secondary | ICD-10-CM | POA: Diagnosis not present

## 2020-01-25 DIAGNOSIS — N186 End stage renal disease: Secondary | ICD-10-CM | POA: Diagnosis not present

## 2020-01-25 DIAGNOSIS — Z4931 Encounter for adequacy testing for hemodialysis: Secondary | ICD-10-CM | POA: Diagnosis not present

## 2020-01-25 DIAGNOSIS — I132 Hypertensive heart and chronic kidney disease with heart failure and with stage 5 chronic kidney disease, or end stage renal disease: Secondary | ICD-10-CM | POA: Diagnosis not present

## 2020-01-25 DIAGNOSIS — N2581 Secondary hyperparathyroidism of renal origin: Secondary | ICD-10-CM | POA: Diagnosis not present

## 2020-01-28 DIAGNOSIS — Z992 Dependence on renal dialysis: Secondary | ICD-10-CM | POA: Diagnosis not present

## 2020-01-28 DIAGNOSIS — Z4931 Encounter for adequacy testing for hemodialysis: Secondary | ICD-10-CM | POA: Diagnosis not present

## 2020-01-28 DIAGNOSIS — D631 Anemia in chronic kidney disease: Secondary | ICD-10-CM | POA: Diagnosis not present

## 2020-01-28 DIAGNOSIS — E1122 Type 2 diabetes mellitus with diabetic chronic kidney disease: Secondary | ICD-10-CM | POA: Diagnosis not present

## 2020-01-28 DIAGNOSIS — E8779 Other fluid overload: Secondary | ICD-10-CM | POA: Diagnosis not present

## 2020-01-28 DIAGNOSIS — N2581 Secondary hyperparathyroidism of renal origin: Secondary | ICD-10-CM | POA: Diagnosis not present

## 2020-01-28 DIAGNOSIS — N186 End stage renal disease: Secondary | ICD-10-CM | POA: Diagnosis not present

## 2020-01-28 DIAGNOSIS — I132 Hypertensive heart and chronic kidney disease with heart failure and with stage 5 chronic kidney disease, or end stage renal disease: Secondary | ICD-10-CM | POA: Diagnosis not present

## 2020-01-28 DIAGNOSIS — I509 Heart failure, unspecified: Secondary | ICD-10-CM | POA: Diagnosis not present

## 2020-01-30 DIAGNOSIS — E1122 Type 2 diabetes mellitus with diabetic chronic kidney disease: Secondary | ICD-10-CM | POA: Diagnosis not present

## 2020-01-30 DIAGNOSIS — E8779 Other fluid overload: Secondary | ICD-10-CM | POA: Diagnosis not present

## 2020-01-30 DIAGNOSIS — Z4931 Encounter for adequacy testing for hemodialysis: Secondary | ICD-10-CM | POA: Diagnosis not present

## 2020-01-30 DIAGNOSIS — I509 Heart failure, unspecified: Secondary | ICD-10-CM | POA: Diagnosis not present

## 2020-01-30 DIAGNOSIS — I132 Hypertensive heart and chronic kidney disease with heart failure and with stage 5 chronic kidney disease, or end stage renal disease: Secondary | ICD-10-CM | POA: Diagnosis not present

## 2020-01-30 DIAGNOSIS — N2581 Secondary hyperparathyroidism of renal origin: Secondary | ICD-10-CM | POA: Diagnosis not present

## 2020-01-30 DIAGNOSIS — N186 End stage renal disease: Secondary | ICD-10-CM | POA: Diagnosis not present

## 2020-01-30 DIAGNOSIS — Z992 Dependence on renal dialysis: Secondary | ICD-10-CM | POA: Diagnosis not present

## 2020-01-30 DIAGNOSIS — D631 Anemia in chronic kidney disease: Secondary | ICD-10-CM | POA: Diagnosis not present

## 2020-01-31 DIAGNOSIS — E1122 Type 2 diabetes mellitus with diabetic chronic kidney disease: Secondary | ICD-10-CM | POA: Diagnosis not present

## 2020-01-31 DIAGNOSIS — N186 End stage renal disease: Secondary | ICD-10-CM | POA: Diagnosis not present

## 2020-01-31 DIAGNOSIS — D631 Anemia in chronic kidney disease: Secondary | ICD-10-CM | POA: Diagnosis not present

## 2020-01-31 DIAGNOSIS — E8779 Other fluid overload: Secondary | ICD-10-CM | POA: Diagnosis not present

## 2020-01-31 DIAGNOSIS — N2581 Secondary hyperparathyroidism of renal origin: Secondary | ICD-10-CM | POA: Diagnosis not present

## 2020-01-31 DIAGNOSIS — Z4931 Encounter for adequacy testing for hemodialysis: Secondary | ICD-10-CM | POA: Diagnosis not present

## 2020-01-31 DIAGNOSIS — Z992 Dependence on renal dialysis: Secondary | ICD-10-CM | POA: Diagnosis not present

## 2020-01-31 DIAGNOSIS — I132 Hypertensive heart and chronic kidney disease with heart failure and with stage 5 chronic kidney disease, or end stage renal disease: Secondary | ICD-10-CM | POA: Diagnosis not present

## 2020-01-31 DIAGNOSIS — I509 Heart failure, unspecified: Secondary | ICD-10-CM | POA: Diagnosis not present

## 2020-02-01 DIAGNOSIS — Z992 Dependence on renal dialysis: Secondary | ICD-10-CM | POA: Diagnosis not present

## 2020-02-01 DIAGNOSIS — E1122 Type 2 diabetes mellitus with diabetic chronic kidney disease: Secondary | ICD-10-CM | POA: Diagnosis not present

## 2020-02-01 DIAGNOSIS — I132 Hypertensive heart and chronic kidney disease with heart failure and with stage 5 chronic kidney disease, or end stage renal disease: Secondary | ICD-10-CM | POA: Diagnosis not present

## 2020-02-01 DIAGNOSIS — E8779 Other fluid overload: Secondary | ICD-10-CM | POA: Diagnosis not present

## 2020-02-01 DIAGNOSIS — D631 Anemia in chronic kidney disease: Secondary | ICD-10-CM | POA: Diagnosis not present

## 2020-02-01 DIAGNOSIS — N2581 Secondary hyperparathyroidism of renal origin: Secondary | ICD-10-CM | POA: Diagnosis not present

## 2020-02-01 DIAGNOSIS — Z4931 Encounter for adequacy testing for hemodialysis: Secondary | ICD-10-CM | POA: Diagnosis not present

## 2020-02-01 DIAGNOSIS — N186 End stage renal disease: Secondary | ICD-10-CM | POA: Diagnosis not present

## 2020-02-01 DIAGNOSIS — I509 Heart failure, unspecified: Secondary | ICD-10-CM | POA: Diagnosis not present

## 2020-02-01 NOTE — Progress Notes (Signed)
I, Brenda Davis, LAT, ATC, am serving as scribe for Dr. Lynne Leader.  Brenda Davis is a 55 y.o. female who presents to [place] at [facility] today for B hand pain.  She was last seen by Dr. Georgina Snell on 04/25/19 for L trigger thumb and prior to that for L middle finger trigger finger. She had a L trigger thumb injection at her last visit.  Since then, pt reports R web space pain between her R index finger and thumb and is also having triggering in her L thumb.  She also reports some intermittent triggering in her R pinky finger.  She denies any swelling in her R hand but may have some swelling in her L thenar eminence.  She is having some intermittent numbness in her L hand but feels that this may due to a fistula that she has in her L arm for dialysis.   Pertinent review of systems: No fevers or chills  Relevant historical information: CAD, diabetes   Exam:  BP 106/78 (BP Location: Right Arm, Patient Position: Sitting, Cuff Size: Large)   Ht 5\' 5"  (1.651 m)   Wt 281 lb 3.2 oz (127.6 kg)   BMI 46.79 kg/m  General: Well Developed, well nourished, and in no acute distress.   MSK: Left thumb normal-appearing.  Tender palpation palmar MCP.  Triggering present with flexion of IP joint.  Intact strength  Right thumb normal-appearing mildly tender palpation vulvar thumb near [place]. No triggering present.  Intact strength.    Lab and Radiology Results  Procedure: Real-time Ultrasound Guided Injection of left thumb A1 pulley (trigger injection) Device: Philips Affiniti 50G Images permanently stored and available for review in PACS Verbal informed consent obtained.  Discussed risks and benefits of procedure. Warned about infection bleeding damage to structures skin hypopigmentation and fat atrophy among others. Patient expresses understanding and agreement Time-out conducted.   Noted no overlying erythema, induration, or other signs of local infection.   Skin  prepped in a sterile fashion.   Local anesthesia: Topical Ethyl chloride.   With sterile technique and under real time ultrasound guidance:  40 mg of Kenalog and 1 mL of lidocaine injected into tendon sheath at A1 pulley. Fluid seen entering the tendon sheath.   Completed without difficulty   Pain immediately resolved suggesting accurate placement of the medication.   Advised to call if fevers/chills, erythema, induration, drainage, or persistent bleeding.   Images permanently stored and available for review in the ultrasound unit.  Impression: Technically successful ultrasound guided injection.         Assessment and Plan: 55 y.o. female with left trigger thumb.  Recurrent problem.  Last injection was April 2021.  Plan for repeat injection today.  Also recommend continuing double Band-Aid splint.  If this recurs again would consider referral to hand surgery for definitive surgical management of this issue.  Right thumb pain thought to be due to first [facility] DJD.  Discussed options.  Patient elects for conservative management trial with Voltaren gel heating pad and home exercise program.  If not improving would consider injection.  Check back in a month if not better.   PDMP not reviewed this encounter. Orders Placed This Encounter  Procedures  . [place] LIMITED JOINT SPACE STRUCTURES UP BILAT(NO LINKED CHARGES)    Order Specific Question:   Reason for Exam (SYMPTOM  OR DIAGNOSIS REQUIRED)    Answer:   B thumb pain    Order Specific Question:   Preferred  imaging location?    Answer:   [place]   No orders of the defined types were placed in this encounter.    Discussed warning signs or symptoms. Please see discharge instructions. Patient expresses understanding.   The above documentation has been reviewed and is accurate and complete Lynne Leader, M.D.

## 2020-02-02 ENCOUNTER — Other Ambulatory Visit: Payer: Self-pay

## 2020-02-02 ENCOUNTER — Ambulatory Visit (INDEPENDENT_AMBULATORY_CARE_PROVIDER_SITE_OTHER): Payer: Medicare HMO | Admitting: Family Medicine

## 2020-02-02 ENCOUNTER — Encounter: Payer: Self-pay | Admitting: Family Medicine

## 2020-02-02 ENCOUNTER — Ambulatory Visit: Payer: Self-pay

## 2020-02-02 VITALS — BP 106/78 | Ht 65.0 in | Wt 281.2 lb

## 2020-02-02 DIAGNOSIS — N186 End stage renal disease: Secondary | ICD-10-CM | POA: Diagnosis not present

## 2020-02-02 DIAGNOSIS — E1122 Type 2 diabetes mellitus with diabetic chronic kidney disease: Secondary | ICD-10-CM | POA: Diagnosis not present

## 2020-02-02 DIAGNOSIS — M65312 Trigger thumb, left thumb: Secondary | ICD-10-CM

## 2020-02-02 DIAGNOSIS — M79645 Pain in left finger(s): Secondary | ICD-10-CM

## 2020-02-02 DIAGNOSIS — N2581 Secondary hyperparathyroidism of renal origin: Secondary | ICD-10-CM | POA: Diagnosis not present

## 2020-02-02 DIAGNOSIS — M79644 Pain in right finger(s): Secondary | ICD-10-CM

## 2020-02-02 DIAGNOSIS — Z992 Dependence on renal dialysis: Secondary | ICD-10-CM | POA: Diagnosis not present

## 2020-02-02 DIAGNOSIS — D631 Anemia in chronic kidney disease: Secondary | ICD-10-CM | POA: Diagnosis not present

## 2020-02-02 DIAGNOSIS — Z4931 Encounter for adequacy testing for hemodialysis: Secondary | ICD-10-CM | POA: Diagnosis not present

## 2020-02-02 DIAGNOSIS — I132 Hypertensive heart and chronic kidney disease with heart failure and with stage 5 chronic kidney disease, or end stage renal disease: Secondary | ICD-10-CM | POA: Diagnosis not present

## 2020-02-02 DIAGNOSIS — I509 Heart failure, unspecified: Secondary | ICD-10-CM | POA: Diagnosis not present

## 2020-02-02 DIAGNOSIS — E8779 Other fluid overload: Secondary | ICD-10-CM | POA: Diagnosis not present

## 2020-02-02 DIAGNOSIS — G8929 Other chronic pain: Secondary | ICD-10-CM

## 2020-02-02 NOTE — Patient Instructions (Addendum)
You had a L thumb injection today.  Call or go to the ER if you develop a large red swollen joint with extreme pain or oozing puss.   Use the double bandaid splint on the thumb.   For the right thumb pain try voltaren gel.  Try heat Try hand therapy. Use modeling clay to pinch, push, pull, squeeze  Recheck with me as needed.   I can inject the right thumb joint.   Marland Kitchen

## 2020-02-03 DIAGNOSIS — Z4931 Encounter for adequacy testing for hemodialysis: Secondary | ICD-10-CM | POA: Diagnosis not present

## 2020-02-03 DIAGNOSIS — E8779 Other fluid overload: Secondary | ICD-10-CM | POA: Diagnosis not present

## 2020-02-03 DIAGNOSIS — N2581 Secondary hyperparathyroidism of renal origin: Secondary | ICD-10-CM | POA: Diagnosis not present

## 2020-02-03 DIAGNOSIS — N186 End stage renal disease: Secondary | ICD-10-CM | POA: Diagnosis not present

## 2020-02-03 DIAGNOSIS — Z992 Dependence on renal dialysis: Secondary | ICD-10-CM | POA: Diagnosis not present

## 2020-02-03 DIAGNOSIS — D631 Anemia in chronic kidney disease: Secondary | ICD-10-CM | POA: Diagnosis not present

## 2020-02-03 DIAGNOSIS — I132 Hypertensive heart and chronic kidney disease with heart failure and with stage 5 chronic kidney disease, or end stage renal disease: Secondary | ICD-10-CM | POA: Diagnosis not present

## 2020-02-03 DIAGNOSIS — I509 Heart failure, unspecified: Secondary | ICD-10-CM | POA: Diagnosis not present

## 2020-02-03 DIAGNOSIS — E1122 Type 2 diabetes mellitus with diabetic chronic kidney disease: Secondary | ICD-10-CM | POA: Diagnosis not present

## 2020-02-06 DIAGNOSIS — D631 Anemia in chronic kidney disease: Secondary | ICD-10-CM | POA: Diagnosis not present

## 2020-02-06 DIAGNOSIS — E1122 Type 2 diabetes mellitus with diabetic chronic kidney disease: Secondary | ICD-10-CM | POA: Diagnosis not present

## 2020-02-06 DIAGNOSIS — Z992 Dependence on renal dialysis: Secondary | ICD-10-CM | POA: Diagnosis not present

## 2020-02-06 DIAGNOSIS — N186 End stage renal disease: Secondary | ICD-10-CM | POA: Diagnosis not present

## 2020-02-06 DIAGNOSIS — I509 Heart failure, unspecified: Secondary | ICD-10-CM | POA: Diagnosis not present

## 2020-02-06 DIAGNOSIS — N2581 Secondary hyperparathyroidism of renal origin: Secondary | ICD-10-CM | POA: Diagnosis not present

## 2020-02-06 DIAGNOSIS — Z4931 Encounter for adequacy testing for hemodialysis: Secondary | ICD-10-CM | POA: Diagnosis not present

## 2020-02-06 DIAGNOSIS — I132 Hypertensive heart and chronic kidney disease with heart failure and with stage 5 chronic kidney disease, or end stage renal disease: Secondary | ICD-10-CM | POA: Diagnosis not present

## 2020-02-06 DIAGNOSIS — E8779 Other fluid overload: Secondary | ICD-10-CM | POA: Diagnosis not present

## 2020-02-08 ENCOUNTER — Telehealth: Payer: Medicare HMO | Admitting: Internal Medicine

## 2020-02-08 ENCOUNTER — Ambulatory Visit
Admission: EM | Admit: 2020-02-08 | Discharge: 2020-02-08 | Discharge status: Against Medical Advice | Payer: Medicare HMO

## 2020-02-08 DIAGNOSIS — I509 Heart failure, unspecified: Secondary | ICD-10-CM | POA: Diagnosis not present

## 2020-02-08 DIAGNOSIS — D631 Anemia in chronic kidney disease: Secondary | ICD-10-CM | POA: Diagnosis not present

## 2020-02-08 DIAGNOSIS — N2581 Secondary hyperparathyroidism of renal origin: Secondary | ICD-10-CM | POA: Diagnosis not present

## 2020-02-08 DIAGNOSIS — E8779 Other fluid overload: Secondary | ICD-10-CM | POA: Diagnosis not present

## 2020-02-08 DIAGNOSIS — N186 End stage renal disease: Secondary | ICD-10-CM | POA: Diagnosis not present

## 2020-02-08 DIAGNOSIS — I132 Hypertensive heart and chronic kidney disease with heart failure and with stage 5 chronic kidney disease, or end stage renal disease: Secondary | ICD-10-CM | POA: Diagnosis not present

## 2020-02-08 DIAGNOSIS — E1122 Type 2 diabetes mellitus with diabetic chronic kidney disease: Secondary | ICD-10-CM | POA: Diagnosis not present

## 2020-02-08 DIAGNOSIS — Z992 Dependence on renal dialysis: Secondary | ICD-10-CM | POA: Diagnosis not present

## 2020-02-08 DIAGNOSIS — Z4931 Encounter for adequacy testing for hemodialysis: Secondary | ICD-10-CM | POA: Diagnosis not present

## 2020-02-08 NOTE — Progress Notes (Signed)
Virtual Visit via Video Note  I connected with Brenda Davis on 02/08/20 at  2:20 PM EST by a video enabled telemedicine application however the patient did not show up and was unavailable via phone to have her visit.   Hoyt Koch, MD

## 2020-02-10 DIAGNOSIS — N2581 Secondary hyperparathyroidism of renal origin: Secondary | ICD-10-CM | POA: Diagnosis not present

## 2020-02-10 DIAGNOSIS — N186 End stage renal disease: Secondary | ICD-10-CM | POA: Diagnosis not present

## 2020-02-10 DIAGNOSIS — I509 Heart failure, unspecified: Secondary | ICD-10-CM | POA: Diagnosis not present

## 2020-02-10 DIAGNOSIS — E1122 Type 2 diabetes mellitus with diabetic chronic kidney disease: Secondary | ICD-10-CM | POA: Diagnosis not present

## 2020-02-10 DIAGNOSIS — Z992 Dependence on renal dialysis: Secondary | ICD-10-CM | POA: Diagnosis not present

## 2020-02-10 DIAGNOSIS — Z4931 Encounter for adequacy testing for hemodialysis: Secondary | ICD-10-CM | POA: Diagnosis not present

## 2020-02-10 DIAGNOSIS — E8779 Other fluid overload: Secondary | ICD-10-CM | POA: Diagnosis not present

## 2020-02-10 DIAGNOSIS — D631 Anemia in chronic kidney disease: Secondary | ICD-10-CM | POA: Diagnosis not present

## 2020-02-10 DIAGNOSIS — I132 Hypertensive heart and chronic kidney disease with heart failure and with stage 5 chronic kidney disease, or end stage renal disease: Secondary | ICD-10-CM | POA: Diagnosis not present

## 2020-02-12 ENCOUNTER — Encounter (HOSPITAL_COMMUNITY): Payer: Self-pay | Admitting: *Deleted

## 2020-02-12 ENCOUNTER — Emergency Department (HOSPITAL_COMMUNITY)
Admission: EM | Admit: 2020-02-12 | Discharge: 2020-02-13 | Disposition: A | Discharge status: Home / Self Care | Payer: Medicare HMO | Attending: Emergency Medicine | Admitting: Emergency Medicine

## 2020-02-12 ENCOUNTER — Other Ambulatory Visit: Payer: Self-pay

## 2020-02-12 DIAGNOSIS — Z9884 Bariatric surgery status: Secondary | ICD-10-CM | POA: Diagnosis not present

## 2020-02-12 DIAGNOSIS — Z7982 Long term (current) use of aspirin: Secondary | ICD-10-CM | POA: Insufficient documentation

## 2020-02-12 DIAGNOSIS — R112 Nausea with vomiting, unspecified: Secondary | ICD-10-CM | POA: Diagnosis not present

## 2020-02-12 DIAGNOSIS — E871 Hypo-osmolality and hyponatremia: Secondary | ICD-10-CM | POA: Diagnosis not present

## 2020-02-12 DIAGNOSIS — N186 End stage renal disease: Secondary | ICD-10-CM | POA: Insufficient documentation

## 2020-02-12 DIAGNOSIS — D631 Anemia in chronic kidney disease: Secondary | ICD-10-CM | POA: Diagnosis not present

## 2020-02-12 DIAGNOSIS — R197 Diarrhea, unspecified: Secondary | ICD-10-CM | POA: Insufficient documentation

## 2020-02-12 DIAGNOSIS — Z992 Dependence on renal dialysis: Secondary | ICD-10-CM | POA: Insufficient documentation

## 2020-02-12 DIAGNOSIS — I120 Hypertensive chronic kidney disease with stage 5 chronic kidney disease or end stage renal disease: Secondary | ICD-10-CM | POA: Diagnosis not present

## 2020-02-12 DIAGNOSIS — I1311 Hypertensive heart and chronic kidney disease without heart failure, with stage 5 chronic kidney disease, or end stage renal disease: Secondary | ICD-10-CM | POA: Insufficient documentation

## 2020-02-12 DIAGNOSIS — I2510 Atherosclerotic heart disease of native coronary artery without angina pectoris: Secondary | ICD-10-CM | POA: Insufficient documentation

## 2020-02-12 DIAGNOSIS — Z87891 Personal history of nicotine dependence: Secondary | ICD-10-CM | POA: Diagnosis not present

## 2020-02-12 DIAGNOSIS — R002 Palpitations: Secondary | ICD-10-CM | POA: Insufficient documentation

## 2020-02-12 DIAGNOSIS — U071 COVID-19: Secondary | ICD-10-CM | POA: Diagnosis not present

## 2020-02-12 DIAGNOSIS — R0602 Shortness of breath: Secondary | ICD-10-CM | POA: Diagnosis not present

## 2020-02-12 DIAGNOSIS — Z79899 Other long term (current) drug therapy: Secondary | ICD-10-CM | POA: Insufficient documentation

## 2020-02-12 DIAGNOSIS — R0689 Other abnormalities of breathing: Secondary | ICD-10-CM | POA: Diagnosis not present

## 2020-02-12 DIAGNOSIS — R0902 Hypoxemia: Secondary | ICD-10-CM | POA: Diagnosis not present

## 2020-02-12 LAB — CBC
HCT: 28.4 % — ABNORMAL LOW (ref 36.0–46.0)
Hemoglobin: 9.6 g/dL — ABNORMAL LOW (ref 12.0–15.0)
MCH: 32.3 pg (ref 26.0–34.0)
MCHC: 33.8 g/dL (ref 30.0–36.0)
MCV: 95.6 fL (ref 80.0–100.0)
Platelets: 303 10*3/uL (ref 150–400)
RBC: 2.97 MIL/uL — ABNORMAL LOW (ref 3.87–5.11)
RDW: 15.8 % — ABNORMAL HIGH (ref 11.5–15.5)
WBC: 6.3 10*3/uL (ref 4.0–10.5)
nRBC: 0.6 % — ABNORMAL HIGH (ref 0.0–0.2)

## 2020-02-12 LAB — BASIC METABOLIC PANEL
Anion gap: 19 — ABNORMAL HIGH (ref 5–15)
BUN: 72 mg/dL — ABNORMAL HIGH (ref 6–20)
CO2: 22 mmol/L (ref 22–32)
Calcium: 6.2 mg/dL — CL (ref 8.9–10.3)
Chloride: 91 mmol/L — ABNORMAL LOW (ref 98–111)
Creatinine, Ser: 11.44 mg/dL — ABNORMAL HIGH (ref 0.44–1.00)
GFR, Estimated: 4 mL/min — ABNORMAL LOW (ref >60)
Glucose, Bld: 78 mg/dL (ref 70–99)
Potassium: 4.4 mmol/L (ref 3.5–5.1)
Sodium: 132 mmol/L — ABNORMAL LOW (ref 135–145)

## 2020-02-12 LAB — I-STAT BETA HCG BLOOD, ED (MC, WL, AP ONLY): I-stat hCG, quantitative: <5 m[IU]/mL (ref <5)

## 2020-02-12 MED ORDER — ONDANSETRON 4 MG PO TBDP
8.0000 mg | ORAL_TABLET | Freq: Once | ORAL | Status: AC
  Administered 2020-02-12: 8 mg via ORAL
  Filled 2020-02-12: qty 2

## 2020-02-12 NOTE — ED Provider Notes (Signed)
[facility] EMERGENCY DEPARTMENT Provider Note   CSN: [id number] Arrival date & time: 02/12/20  1329   History Chief Complaint  Patient presents with  . Weakness  . Palpitations    Brenda Davis is a 55 y.o. female.  The history is provided by the patient.  Weakness Palpitations Associated symptoms: weakness   She has history of hypertension, diabetes, hyperlipidemia, end-stage renal disease on hemodialysis, coronary artery disease and was diagnosed with COVID-19 at an urgent care center 4 days ago.  She started being sick 9 days ago with a cough which is mainly nonproductive.  She had been having fever as high as 103 degrees along with chills and sweats.  She is also been having nausea, vomiting, diarrhea.  She has lost her sense of taste but not her sense of smell.  About 2 days ago, she stopped running fevers.  She is having 2-3 bowel movements a day despite using loperamide.  She continues to have nausea and vomiting.  Cough has subsided.  She denies any dyspnea and denies arthralgias or myalgias.  Her dialysis nurse told her to come into the hospital today to get monoclonal antibodies.  Past Medical History:  Diagnosis Date  . Anemia   . CAD (coronary artery disease)    a. cath in 02/2018 showing moderate 3-vessel CAD with 45% mid-LADm 55% LCx and 65% RCA stenosis which was not significant by FFR  . ESRD on dialysis ([place])   . Gout   . HCAP (healthcare-associated pneumonia) 05/06/2016  . History of blood transfusion    "related to surgery"  . Hyperlipidemia   . Hypertension   . Morbid obesity ([place])   . Pain    LEFT SHOULDER PAIN - WAS SEEN AT AN URGENT CARE - GIVEN SLING FOR COMFORT AND TOLD ROM AS TOLERATED.  Marland Kitchen Palpitations 09/24/2016  . Peritonitis, dialysis-associated ([place])   . Type II diabetes mellitus ([place])    "gastric sleeve OR corrected this" (05/06/2016)    Patient Active Problem List   Diagnosis Date Noted  . Anemia due to acquired  thiamine deficiency 04/10/2019  . Deficiency anemia 04/04/2019  . Unstable angina ([place]) 03/03/2019  . Chest pain 03/02/2019  . Retroperitoneal hemorrhage - post Cath/PCI - taken to OR for drainage & CFA repair 01/18/2019  . Acute blood loss anemia - resolved, transfused in OR - Hgb stable 01/18/2019  . Increased anion gap metabolic acidosis 71/69/6789  . Shock circulatory ([place]) - secondary to acute blood loss anemia, elevated WBC (? sepsis vs post-op inflammation).h 01/18/2019  . Postprocedural hypotension   . Hyperkalemia   . Dysuria 11/25/2018  . Coronary artery disease involving native coronary artery of native heart with unstable angina pectoris ([place])   . Hypocalcemia 06/18/2018  . Chest pain, rule out acute myocardial infarction 06/18/2018  . Localized swelling of finger of left hand 06/01/2018  . Sepsis ([place]) 05/23/2018  . C. difficile diarrhea 05/08/2018  . Diarrhea of infectious origin 05/04/2018  . Elevated troponin 03/08/2018  . Folate deficiency 03/08/2018  . NSTEMI (non-ST elevated myocardial infarction) ([place])   . CAD in native artery   . Hepatic cirrhosis ([place]) 11/24/2017  . DM (diabetes mellitus), type 2 with renal complications ([place]) 38/10/1749  . Chronic heart failure with preserved ejection fraction ([place]) 06/22/2017  . Trigger finger, left middle finger 09/14/2016  . Sensorineural hearing loss (SNHL), bilateral 06/29/2016  . Allergic rhinitis 06/27/2016  . Severe persistent asthma with exacerbation 05/12/2016  . Acute respiratory failure  with hypoxia and hypercapnia ([place]) 05/06/2016  . Anemia of chronic disease 05/06/2016  . Routine general medical examination at a health care facility 12/05/2015  . Cervical cancer screening 12/05/2015  . Visit for screening mammogram 01/23/2015  . Cough 01/23/2015  . Gout due to renal impairment 09/13/2014  . Thiamine deficiency 12/02/2012  . Hypokalemia 11/28/2012  . S/P laparoscopic sleeve gastrectomy 11/28/2012  . B12  deficiency anemia 11/28/2012  . Morbid obesity with BMI of 40.0-44.9, adult ([place]) 07/22/2012  . Dyslipidemia, goal LDL below 70 04/09/2008  . CKD (chronic kidney disease) stage V requiring chronic dialysis (MWF) 02/08/2008    Past Surgical History:  Procedure Laterality Date  . AV FISTULA PLACEMENT Left 01/03/2015   Procedure: BRACHIAL CEPHALIC ARTERIOVENOUS  FISTULA CREATION LEFT ARM;  Surgeon: Angelia Mould, MD;  Location: [facility];  Service: Vascular;  Laterality: Left;  . CARPAL TUNNEL RELEASE Right   . [place]  . CHOLECYSTECTOMY  09/14/2017  . CORONARY STENT INTERVENTION N/A 01/17/2019   Procedure: CORONARY STENT INTERVENTION;  Surgeon: Jettie Booze, MD;  Location: [facility];  Service: Cardiovascular;  Laterality: N/A;  . ESOPHAGOGASTRODUODENOSCOPY N/A 09/04/2012   Procedure: ESOPHAGOGASTRODUODENOSCOPY (EGD);  Surgeon: Shann Medal, MD;  Location: [facility];  Service: General;  Laterality: N/A;  PF  . ESOPHAGOGASTRODUODENOSCOPY (EGD) WITH ESOPHAGEAL DILATION N/A 09/29/2012   Procedure: ESOPHAGOGASTRODUODENOSCOPY (EGD) WITH ESOPHAGEAL DILATION;  Surgeon: Milus Banister, MD;  [facility];  Service: Endoscopy;  Laterality: N/A;  . FEMORAL ARTERY EXPLORATION Right 01/17/2019   Procedure: Exploration Right Groin with Primary Closure or Arteriotomy Site; Washout of Retroperitoneal Hematoma;  Surgeon: Waynetta Sandy, MD;  Location: [facility];  Service: Vascular;  Laterality: Right;  . INSERTION OF DIALYSIS CATHETER N/A 01/03/2015   Procedure: INSERTION OF DIALYSIS CATHETER RIGHT INTERNAL JUGULAR;  Surgeon: Angelia Mould, MD;  Location: [facility];  Service: Vascular;  Laterality: N/A;  . LAPAROSCOPIC GASTRIC SLEEVE RESECTION N/A 07/19/2012   Procedure: LAPAROSCOPIC SLEEVE GASTRECTOMY with EGD;  Surgeon: Madilyn Hook, DO;  Location: [facility];  Service: General;  Laterality: N/A;  laparoscopic sleeve gastrectomy with EGD  . LEFT  HEART CATH AND CORONARY ANGIOGRAPHY N/A 02/25/2018   Procedure: LEFT HEART CATH AND CORONARY ANGIOGRAPHY;  Surgeon: Leonie Man, MD;  Location: [facility];  Service: Cardiovascular;  Laterality: N/A;  . LEFT HEART CATH AND CORONARY ANGIOGRAPHY N/A 06/20/2018   Procedure: LEFT HEART CATH AND CORONARY ANGIOGRAPHY;  Surgeon: Nelva Bush, MD;  Location: [facility];  Service: Cardiovascular;  Laterality: N/A;  . LEFT HEART CATH AND CORONARY ANGIOGRAPHY N/A 01/16/2019   Procedure: LEFT HEART CATH AND CORONARY ANGIOGRAPHY;  Surgeon: Jettie Booze, MD;  Location: [facility];  Service: Cardiovascular;  Laterality: N/A;  . LEFT HEART CATH AND CORONARY ANGIOGRAPHY N/A 03/03/2019   Procedure: LEFT HEART CATH AND CORONARY ANGIOGRAPHY;  Surgeon: Jettie Booze, MD;  Location: [facility];  Service: Cardiovascular;  Laterality: N/A;  . PERIPHERAL VASCULAR BALLOON ANGIOPLASTY Left 08/18/2019   Procedure: PERIPHERAL VASCULAR BALLOON ANGIOPLASTY;  Surgeon: Marty Heck, MD;  Location: [facility];  Service: Cardiovascular;  Laterality: Left;  arm fistula  . PERIPHERAL VASCULAR CATHETERIZATION Left 05/23/2015   Procedure: Nolon Stalls;  Surgeon: Conrad Hewitt, MD;  Location: [facility];  Service: Cardiovascular;  Laterality: Left;  upper aRM  . PERIPHERAL VASCULAR CATHETERIZATION Left 05/23/2015   Procedure: Peripheral Vascular Balloon Angioplasty;  Surgeon: Conrad San Lorenzo, MD;  Location: [facility];  Service: Cardiovascular;  Laterality: Left;  av fistula  . TUBAL LIGATION  1993  . UPPER GI ENDOSCOPY N/A 07/19/2012   Procedure: UPPER GI ENDOSCOPY;  Surgeon: Madilyn Hook, DO;  [facility];  Service: General;  Laterality: N/A;     OB History   No obstetric history on file.     Family History  Problem Relation Age of Onset  . Hypertension Mother   . Mitral valve prolapse Mother   . Diabetes Father   . Arthritis Other   .  Hyperlipidemia Other   . Cancer Neg Hx   . Heart disease Neg Hx   . Kidney disease Neg Hx   . Stroke Neg Hx     Social History   Tobacco Use  . Smoking status: Former Smoker    Packs/day: 1.00    Years: 16.00    Pack years: 16.00    Types: Cigarettes    Quit date: 2009    Years since quitting: 13.0  . Smokeless tobacco: Never Used  Vaping Use  . Vaping Use: Never used  Substance Use Topics  . Alcohol use: Never  . Drug use: No    Home Medications Prior to Admission medications   Medication Sig Start Date End Date Taking? Authorizing Provider  acetaminophen (TYLENOL) 500 MG tablet Take 1,000 mg by mouth every 6 (six) hours as needed for moderate pain or headache.    [provider]  aspirin EC 81 MG EC tablet Take 1 tablet (81 mg total) by mouth daily. 02/26/18   Norval Morton, MD  B Complex-C-Biotin-D-FA (DIALYVITE 800 PLUS D) 800 MCG WAFR Take by mouth. 09/04/19   [provider]  benzonatate (TESSALON) 100 MG capsule Take 1 capsule (100 mg total) by mouth 2 (two) times daily as needed for cough. 01/05/20   Jaynee Eagles, PA-C  calcitRIOL (ROCALTROL) 0.5 MCG capsule Take 1.5 mcg by mouth daily.  04/29/19   [provider]  calcium acetate (PHOSLO) 667 MG capsule Take 667-2,001 mg by mouth See admin instructions. Take 2001 mg with each meal and 667 mg with each snack 06/02/18   [provider]  carvedilol (COREG) 25 MG tablet Take 12.5 mg by mouth 2 (two) times daily.  10/16/19   [provider]  cetirizine (ZYRTEC) 5 MG tablet Take 1 tablet 3 times weekly. 01/05/20   Jaynee Eagles, PA-C  ferric citrate (AURYXIA) 1 GM 210 MG(Fe) tablet Take 2 tablets (420 mg total) by mouth 3 (three) times daily with meals. 01/23/19   Daune Perch, NP  folic acid (FOLVITE) 1 MG tablet Take 1 tablet (1 mg total) by mouth daily. 03/08/18   [name], MD  gabapentin (NEURONTIN) 100 MG capsule Take 100 mg by mouth 2 (two) times daily. 10/31/18   [provider]  isosorbide dinitrate (ISORDIL) 20 MG tablet Take 1 tablet (20 mg total) by mouth 2 (two) times daily. 10/19/19   Skeet Latch, MD  Methoxy PEG-Epoetin Beta (MIRCERA IJ) Inject into the skin. 09/19/19   [provider]  mupirocin ointment (BACTROBAN) 2 % Apply topically. 09/01/19   [provider]  nitroGLYCERIN (NITROSTAT) 0.4 MG SL tablet TAKE 1 TABLET BY MOUTH UNDER THE TONGUE EVERY 5 MINUTES AS NEEDED FOR CHEST PAIN Patient taking differently: Place 0.4 mg under the tongue every 5 (five) minutes as needed for chest pain. 03/24/19   Skeet Latch, MD  rosuvastatin ([place])  20 MG tablet Take 1 tablet (20 mg total) by mouth daily. 10/19/19   Skeet Latch, MD    Allergies    Amlodipine, Atorvastatin, Clonidine derivatives, Doxycycline, and Welchol [colesevelam hcl]  Review of Systems   Review of Systems  Cardiovascular: Positive for palpitations.  Neurological: Positive for weakness.  All other systems reviewed and are negative.   Physical Exam Updated Vital Signs BP (!) 104/57 (BP Location: Right Arm)   Pulse 69   Temp 98.4 F (36.9 C) (Oral)   Resp 18   Ht 5' 5"  (1.651 m)   Wt 127.5 kg   SpO2 97%   BMI 46.76 kg/m   Physical Exam Vitals and nursing note reviewed.   55 year old female, resting comfortably and in no acute distress. Vital signs are normal. Oxygen saturation is 97%, which is normal. Head is normocephalic and atraumatic. PERRLA, EOMI. Oropharynx is clear. Neck is nontender and supple without adenopathy or JVD. Back is nontender and there is no CVA tenderness. Lungs are clear without rales, wheezes, or rhonchi. Chest is nontender. Heart has regular rate and rhythm without murmur. Abdomen is soft, flat, nontender without masses or hepatosplenomegaly and peristalsis is normoactive. Extremities have no cyanosis or edema, full range of motion is present.  AV fistula present in the left forearm with thrill present. Skin is  warm and dry without rash. Neurologic: Mental status is normal, cranial nerves are intact, there are no motor or sensory deficits.  ED Results / Procedures / Treatments   Labs (all labs ordered are listed, but only abnormal results are displayed) Labs Reviewed  BASIC METABOLIC PANEL - Abnormal; Notable for the following components:      Result Value   Sodium 132 (*)    Chloride 91 (*)    BUN 72 (*)    Creatinine, Ser 11.44 (*)    Calcium 6.2 (*)    GFR, Estimated 4 (*)    Anion gap 19 (*)    All other components within normal limits  CBC - Abnormal; Notable for the following components:   RBC 2.97 (*)    Hemoglobin 9.6 (*)    HCT 28.4 (*)    RDW 15.8 (*)    nRBC 0.6 (*)    All other components within normal limits  URINALYSIS, ROUTINE W REFLEX MICROSCOPIC  I-STAT BETA HCG BLOOD, ED ([facility], [facility], [facility] ONLY)  CBG MONITORING, ED    EKG EKG Interpretation  Date/Time:  Monday February 12 2020 14:01:28 EST Ventricular Rate:  66 PR Interval:  154 QRS Duration: 74 QT Interval:  512 QTC Calculation: 536 R Axis:   -6 Text Interpretation: Normal sinus rhythm Low voltage QRS Cannot rule out Anterior infarct , age undetermined Prolonged QT Abnormal ECG When compared with ECG of 03/07/2019, QT has lengthened Confirmed by Delora Fuel ([id number]) on 02/12/2020 11:00:11 PM  Procedures Procedures   Medications Ordered in ED Medications  ondansetron (ZOFRAN-ODT) disintegrating tablet 8 mg (8 mg Oral Given 02/12/20 2347)    ED Course  I have reviewed the triage vital signs and the nursing notes.  Pertinent labs & imaging results that were available during my care of the patient were reviewed by me and considered in my medical decision making (see chart for details).  MDM Rules/Calculators/A&P COVID-19 infection with persistent nausea, vomiting, diarrhea and loss of taste.  ECG shows mild prolongation of QT interval.  Labs show stable anemia of renal failure, mild hyponatremia which is not  clinically significant.  Unfortunately, she  is now 9-10 days into her illness and outside of the treatment window for monoclonal antibodies and other Covid treatments.  We will give ondansetron to see if nausea can be brought under control.  Old records are reviewed, and she has no relevant past visits.  She had good relief of nausea with ondansetron and is sent home with a prescription for same.  Return precautions discussed.  Brenda Davis was evaluated in Emergency Department on 02/13/2020 for the symptoms described in the history of present illness. She was evaluated in the context of the global COVID-19 pandemic, which necessitated consideration that the patient might be at risk for infection with the SARS-CoV-2 virus that causes COVID-19. Institutional protocols and algorithms that pertain to the evaluation of patients at risk for COVID-19 are in a state of rapid change based on information released by regulatory bodies including the CDC and federal and state organizations. These policies and algorithms were followed during the patient's care in the ED.  Final Clinical Impression(s) / ED Diagnoses Final diagnoses:  COVID-19 virus infection  Non-intractable vomiting with nausea, unspecified vomiting type  Diarrhea, unspecified type  End-stage renal disease on hemodialysis ([place])  Hyponatremia  Anemia associated with chronic renal failure    Rx / DC Orders ED Discharge Orders         Ordered    ondansetron (ZOFRAN) 4 MG tablet  Every 8 hours PRN        02/13/20 1292           Delora Fuel, MD 90/90/30 0030

## 2020-02-12 NOTE — ED Triage Notes (Signed)
Pt arrived via EMS with complaints of low bp and feeling weak.  Pt was supposed to have home HD today and has access to left upper arm.  Pt has been COVID positive since 1/19.  Reports constant NVD and has been fever free for 2 days.  Pt feels like she is dehydrated, EMS administered 450cc NS. Pt has NSL to right AC by EMS.  Pt sats 88% on RA and she is now on 02/2L.  Pt has history of falls

## 2020-02-13 ENCOUNTER — Ambulatory Visit: Payer: Medicare HMO | Admitting: Cardiovascular Disease

## 2020-02-13 ENCOUNTER — Telehealth: Payer: Self-pay | Admitting: *Deleted

## 2020-02-13 MED ORDER — ONDANSETRON HCL 4 MG PO TABS: 4.0000 mg | ORAL_TABLET | Freq: Three times a day (TID) | ORAL | 0 refills | Status: AC | PRN

## 2020-02-13 NOTE — Discharge Instructions (Signed)
Continue to take loperamide (Imodium AD) as needed for diarrhea.  Return if your symptoms are getting worse.

## 2020-02-13 NOTE — ED Notes (Signed)
Discharge instructions discussed with pt. Pt verbalized understanding. Pt stable and ambulatory. No signature pad available. 

## 2020-02-13 NOTE — Telephone Encounter (Signed)
Patient was scheduled to see Dr Oval Linsey for 3 month follow up She was in ED yesterday for Covid s/s  Test 4-5 days, positive. S/S starty 9-10 days ago Called patient to see about changing visit to virtual, she was sleeping Did speak to her and she would like to reschedule all together Message sent to scheduling to call and arrange.

## 2020-02-14 DIAGNOSIS — Z992 Dependence on renal dialysis: Secondary | ICD-10-CM | POA: Diagnosis not present

## 2020-02-14 DIAGNOSIS — N186 End stage renal disease: Secondary | ICD-10-CM | POA: Diagnosis not present

## 2020-02-14 DIAGNOSIS — I509 Heart failure, unspecified: Secondary | ICD-10-CM | POA: Diagnosis not present

## 2020-02-14 DIAGNOSIS — Z4931 Encounter for adequacy testing for hemodialysis: Secondary | ICD-10-CM | POA: Diagnosis not present

## 2020-02-14 DIAGNOSIS — I132 Hypertensive heart and chronic kidney disease with heart failure and with stage 5 chronic kidney disease, or end stage renal disease: Secondary | ICD-10-CM | POA: Diagnosis not present

## 2020-02-14 DIAGNOSIS — E8779 Other fluid overload: Secondary | ICD-10-CM | POA: Diagnosis not present

## 2020-02-14 DIAGNOSIS — N2581 Secondary hyperparathyroidism of renal origin: Secondary | ICD-10-CM | POA: Diagnosis not present

## 2020-02-14 DIAGNOSIS — D631 Anemia in chronic kidney disease: Secondary | ICD-10-CM | POA: Diagnosis not present

## 2020-02-14 DIAGNOSIS — E1122 Type 2 diabetes mellitus with diabetic chronic kidney disease: Secondary | ICD-10-CM | POA: Diagnosis not present

## 2020-02-15 DIAGNOSIS — N186 End stage renal disease: Secondary | ICD-10-CM | POA: Diagnosis not present

## 2020-02-15 DIAGNOSIS — E1122 Type 2 diabetes mellitus with diabetic chronic kidney disease: Secondary | ICD-10-CM | POA: Diagnosis not present

## 2020-02-15 DIAGNOSIS — I509 Heart failure, unspecified: Secondary | ICD-10-CM | POA: Diagnosis not present

## 2020-02-15 DIAGNOSIS — Z992 Dependence on renal dialysis: Secondary | ICD-10-CM | POA: Diagnosis not present

## 2020-02-15 DIAGNOSIS — I132 Hypertensive heart and chronic kidney disease with heart failure and with stage 5 chronic kidney disease, or end stage renal disease: Secondary | ICD-10-CM | POA: Diagnosis not present

## 2020-02-15 DIAGNOSIS — N2581 Secondary hyperparathyroidism of renal origin: Secondary | ICD-10-CM | POA: Diagnosis not present

## 2020-02-15 DIAGNOSIS — D631 Anemia in chronic kidney disease: Secondary | ICD-10-CM | POA: Diagnosis not present

## 2020-02-15 DIAGNOSIS — E8779 Other fluid overload: Secondary | ICD-10-CM | POA: Diagnosis not present

## 2020-02-15 DIAGNOSIS — Z4931 Encounter for adequacy testing for hemodialysis: Secondary | ICD-10-CM | POA: Diagnosis not present

## 2020-02-16 DIAGNOSIS — Z992 Dependence on renal dialysis: Secondary | ICD-10-CM | POA: Diagnosis not present

## 2020-02-16 DIAGNOSIS — N186 End stage renal disease: Secondary | ICD-10-CM | POA: Diagnosis not present

## 2020-02-16 DIAGNOSIS — I132 Hypertensive heart and chronic kidney disease with heart failure and with stage 5 chronic kidney disease, or end stage renal disease: Secondary | ICD-10-CM | POA: Diagnosis not present

## 2020-02-16 DIAGNOSIS — Z4931 Encounter for adequacy testing for hemodialysis: Secondary | ICD-10-CM | POA: Diagnosis not present

## 2020-02-16 DIAGNOSIS — D631 Anemia in chronic kidney disease: Secondary | ICD-10-CM | POA: Diagnosis not present

## 2020-02-16 DIAGNOSIS — I509 Heart failure, unspecified: Secondary | ICD-10-CM | POA: Diagnosis not present

## 2020-02-16 DIAGNOSIS — E8779 Other fluid overload: Secondary | ICD-10-CM | POA: Diagnosis not present

## 2020-02-16 DIAGNOSIS — N2581 Secondary hyperparathyroidism of renal origin: Secondary | ICD-10-CM | POA: Diagnosis not present

## 2020-02-16 DIAGNOSIS — E1122 Type 2 diabetes mellitus with diabetic chronic kidney disease: Secondary | ICD-10-CM | POA: Diagnosis not present

## 2020-02-18 DIAGNOSIS — D631 Anemia in chronic kidney disease: Secondary | ICD-10-CM | POA: Diagnosis not present

## 2020-02-18 DIAGNOSIS — E1122 Type 2 diabetes mellitus with diabetic chronic kidney disease: Secondary | ICD-10-CM | POA: Diagnosis not present

## 2020-02-18 DIAGNOSIS — I132 Hypertensive heart and chronic kidney disease with heart failure and with stage 5 chronic kidney disease, or end stage renal disease: Secondary | ICD-10-CM | POA: Diagnosis not present

## 2020-02-18 DIAGNOSIS — N2581 Secondary hyperparathyroidism of renal origin: Secondary | ICD-10-CM | POA: Diagnosis not present

## 2020-02-18 DIAGNOSIS — Z992 Dependence on renal dialysis: Secondary | ICD-10-CM | POA: Diagnosis not present

## 2020-02-18 DIAGNOSIS — N186 End stage renal disease: Secondary | ICD-10-CM | POA: Diagnosis not present

## 2020-02-18 DIAGNOSIS — E8779 Other fluid overload: Secondary | ICD-10-CM | POA: Diagnosis not present

## 2020-02-18 DIAGNOSIS — Z4931 Encounter for adequacy testing for hemodialysis: Secondary | ICD-10-CM | POA: Diagnosis not present

## 2020-02-18 DIAGNOSIS — I509 Heart failure, unspecified: Secondary | ICD-10-CM | POA: Diagnosis not present

## 2020-02-19 DIAGNOSIS — N186 End stage renal disease: Secondary | ICD-10-CM | POA: Diagnosis not present

## 2020-02-19 DIAGNOSIS — E1122 Type 2 diabetes mellitus with diabetic chronic kidney disease: Secondary | ICD-10-CM | POA: Diagnosis not present

## 2020-02-19 DIAGNOSIS — Z992 Dependence on renal dialysis: Secondary | ICD-10-CM | POA: Diagnosis not present

## 2020-02-19 DIAGNOSIS — N2581 Secondary hyperparathyroidism of renal origin: Secondary | ICD-10-CM | POA: Diagnosis not present

## 2020-02-19 DIAGNOSIS — I132 Hypertensive heart and chronic kidney disease with heart failure and with stage 5 chronic kidney disease, or end stage renal disease: Secondary | ICD-10-CM | POA: Diagnosis not present

## 2020-02-19 DIAGNOSIS — D631 Anemia in chronic kidney disease: Secondary | ICD-10-CM | POA: Diagnosis not present

## 2020-02-19 DIAGNOSIS — I509 Heart failure, unspecified: Secondary | ICD-10-CM | POA: Diagnosis not present

## 2020-02-19 DIAGNOSIS — Z4931 Encounter for adequacy testing for hemodialysis: Secondary | ICD-10-CM | POA: Diagnosis not present

## 2020-02-19 DIAGNOSIS — E8779 Other fluid overload: Secondary | ICD-10-CM | POA: Diagnosis not present

## 2020-02-20 DIAGNOSIS — E1122 Type 2 diabetes mellitus with diabetic chronic kidney disease: Secondary | ICD-10-CM | POA: Diagnosis not present

## 2020-02-20 DIAGNOSIS — D631 Anemia in chronic kidney disease: Secondary | ICD-10-CM | POA: Diagnosis not present

## 2020-02-20 DIAGNOSIS — N2581 Secondary hyperparathyroidism of renal origin: Secondary | ICD-10-CM | POA: Diagnosis not present

## 2020-02-20 DIAGNOSIS — Z992 Dependence on renal dialysis: Secondary | ICD-10-CM | POA: Diagnosis not present

## 2020-02-20 DIAGNOSIS — E8779 Other fluid overload: Secondary | ICD-10-CM | POA: Diagnosis not present

## 2020-02-20 DIAGNOSIS — I132 Hypertensive heart and chronic kidney disease with heart failure and with stage 5 chronic kidney disease, or end stage renal disease: Secondary | ICD-10-CM | POA: Diagnosis not present

## 2020-02-20 DIAGNOSIS — D509 Iron deficiency anemia, unspecified: Secondary | ICD-10-CM | POA: Diagnosis not present

## 2020-02-20 DIAGNOSIS — I509 Heart failure, unspecified: Secondary | ICD-10-CM | POA: Diagnosis not present

## 2020-02-20 DIAGNOSIS — N186 End stage renal disease: Secondary | ICD-10-CM | POA: Diagnosis not present

## 2020-02-22 DIAGNOSIS — D631 Anemia in chronic kidney disease: Secondary | ICD-10-CM | POA: Diagnosis not present

## 2020-02-22 DIAGNOSIS — D509 Iron deficiency anemia, unspecified: Secondary | ICD-10-CM | POA: Diagnosis not present

## 2020-02-22 DIAGNOSIS — N186 End stage renal disease: Secondary | ICD-10-CM | POA: Diagnosis not present

## 2020-02-22 DIAGNOSIS — N2581 Secondary hyperparathyroidism of renal origin: Secondary | ICD-10-CM | POA: Diagnosis not present

## 2020-02-22 DIAGNOSIS — E8779 Other fluid overload: Secondary | ICD-10-CM | POA: Diagnosis not present

## 2020-02-22 DIAGNOSIS — E1122 Type 2 diabetes mellitus with diabetic chronic kidney disease: Secondary | ICD-10-CM | POA: Diagnosis not present

## 2020-02-22 DIAGNOSIS — Z992 Dependence on renal dialysis: Secondary | ICD-10-CM | POA: Diagnosis not present

## 2020-02-22 DIAGNOSIS — I509 Heart failure, unspecified: Secondary | ICD-10-CM | POA: Diagnosis not present

## 2020-02-22 DIAGNOSIS — I132 Hypertensive heart and chronic kidney disease with heart failure and with stage 5 chronic kidney disease, or end stage renal disease: Secondary | ICD-10-CM | POA: Diagnosis not present

## 2020-02-23 ENCOUNTER — Telehealth: Payer: Self-pay | Admitting: Internal Medicine

## 2020-02-23 ENCOUNTER — Other Ambulatory Visit: Payer: Self-pay

## 2020-02-23 ENCOUNTER — Ambulatory Visit (INDEPENDENT_AMBULATORY_CARE_PROVIDER_SITE_OTHER): Payer: Medicare HMO | Admitting: Internal Medicine

## 2020-02-23 ENCOUNTER — Encounter: Payer: Self-pay | Admitting: Internal Medicine

## 2020-02-23 VITALS — BP 126/82 | HR 75 | Temp 98.6°F | Ht 65.0 in | Wt 280.0 lb

## 2020-02-23 DIAGNOSIS — E538 Deficiency of other specified B group vitamins: Secondary | ICD-10-CM | POA: Diagnosis not present

## 2020-02-23 DIAGNOSIS — D631 Anemia in chronic kidney disease: Secondary | ICD-10-CM | POA: Diagnosis not present

## 2020-02-23 DIAGNOSIS — D638 Anemia in other chronic diseases classified elsewhere: Secondary | ICD-10-CM | POA: Diagnosis not present

## 2020-02-23 DIAGNOSIS — I10 Essential (primary) hypertension: Secondary | ICD-10-CM

## 2020-02-23 DIAGNOSIS — I509 Heart failure, unspecified: Secondary | ICD-10-CM | POA: Diagnosis not present

## 2020-02-23 DIAGNOSIS — D510 Vitamin B12 deficiency anemia due to intrinsic factor deficiency: Secondary | ICD-10-CM | POA: Diagnosis not present

## 2020-02-23 DIAGNOSIS — E1140 Type 2 diabetes mellitus with diabetic neuropathy, unspecified: Secondary | ICD-10-CM | POA: Diagnosis not present

## 2020-02-23 DIAGNOSIS — D539 Nutritional anemia, unspecified: Secondary | ICD-10-CM | POA: Diagnosis not present

## 2020-02-23 DIAGNOSIS — I132 Hypertensive heart and chronic kidney disease with heart failure and with stage 5 chronic kidney disease, or end stage renal disease: Secondary | ICD-10-CM | POA: Diagnosis not present

## 2020-02-23 DIAGNOSIS — E1122 Type 2 diabetes mellitus with diabetic chronic kidney disease: Secondary | ICD-10-CM | POA: Diagnosis not present

## 2020-02-23 DIAGNOSIS — E519 Thiamine deficiency, unspecified: Secondary | ICD-10-CM

## 2020-02-23 DIAGNOSIS — N2581 Secondary hyperparathyroidism of renal origin: Secondary | ICD-10-CM | POA: Diagnosis not present

## 2020-02-23 DIAGNOSIS — Z794 Long term (current) use of insulin: Secondary | ICD-10-CM | POA: Diagnosis not present

## 2020-02-23 DIAGNOSIS — N186 End stage renal disease: Secondary | ICD-10-CM | POA: Diagnosis not present

## 2020-02-23 DIAGNOSIS — Z992 Dependence on renal dialysis: Secondary | ICD-10-CM | POA: Diagnosis not present

## 2020-02-23 DIAGNOSIS — D509 Iron deficiency anemia, unspecified: Secondary | ICD-10-CM | POA: Diagnosis not present

## 2020-02-23 DIAGNOSIS — E8779 Other fluid overload: Secondary | ICD-10-CM | POA: Diagnosis not present

## 2020-02-23 LAB — TSH: TSH: 3.39 u[IU]/mL (ref 0.35–4.50)

## 2020-02-23 LAB — CBC WITH DIFFERENTIAL/PLATELET
Basophils Absolute: 0 10*3/uL (ref 0.0–0.1)
Basophils Relative: 0.3 % (ref 0.0–3.0)
Eosinophils Absolute: 0.1 10*3/uL (ref 0.0–0.7)
Eosinophils Relative: 0.9 % (ref 0.0–5.0)
HCT: 26.5 % — ABNORMAL LOW (ref 36.0–46.0)
Hemoglobin: 8.9 g/dL — ABNORMAL LOW (ref 12.0–15.0)
Lymphocytes Relative: 24.2 % (ref 12.0–46.0)
Lymphs Abs: 1.4 10*3/uL (ref 0.7–4.0)
MCHC: 33.6 g/dL (ref 30.0–36.0)
MCV: 100.7 fl — ABNORMAL HIGH (ref 78.0–100.0)
Monocytes Absolute: 0.9 10*3/uL (ref 0.1–1.0)
Monocytes Relative: 14.9 % — ABNORMAL HIGH (ref 3.0–12.0)
Neutro Abs: 3.5 10*3/uL (ref 1.4–7.7)
Neutrophils Relative %: 59.7 % (ref 43.0–77.0)
Platelets: 391 10*3/uL (ref 150.0–400.0)
RBC: 2.64 Mil/uL — ABNORMAL LOW (ref 3.87–5.11)
RDW: 17.5 % — ABNORMAL HIGH (ref 11.5–15.5)
WBC: 5.8 10*3/uL (ref 4.0–10.5)

## 2020-02-23 LAB — HEMOGLOBIN A1C: Hgb A1c MFr Bld: 6.3 % (ref 4.6–6.5)

## 2020-02-23 LAB — FOLATE: Folate: 4.7 ng/mL — ABNORMAL LOW (ref >5.9)

## 2020-02-23 LAB — FERRITIN: Ferritin: >1500 ng/mL — ABNORMAL HIGH (ref 10.0–291.0)

## 2020-02-23 LAB — VITAMIN B12: Vitamin B-12: 395 pg/mL (ref 211–911)

## 2020-02-23 LAB — IRON: Iron: 60 ug/dL (ref 42–145)

## 2020-02-23 MED ORDER — FOLIC ACID 1 MG PO TABS: 1.0000 mg | ORAL_TABLET | Freq: Every day | ORAL | 1 refills | Status: DC

## 2020-02-23 MED ORDER — OXYCODONE HCL 5 MG PO TABS: 5.0000 mg | ORAL_TABLET | Freq: Four times a day (QID) | ORAL | 0 refills | Status: DC | PRN

## 2020-02-23 NOTE — Telephone Encounter (Signed)
   Patient states she is currently at the pharmacy and the pharmacy states they are"waiting on Dr Ronnald Ramp to release medication"  Please advise

## 2020-02-23 NOTE — Patient Instructions (Signed)
Diabetic Neuropathy Diabetic neuropathy refers to nerve damage that is caused by diabetes. Over time, people with diabetes can develop nerve damage throughout the body. There are several types of diabetic neuropathy:  Peripheral neuropathy. This is the most common type of diabetic neuropathy. It damages the nerves that carry signals between the spinal cord and other parts of the body (peripheral nerves). This usually affects nerves in the feet, legs, hands, and arms.  Autonomic neuropathy. This type causes damage to nerves that control involuntary functions (autonomic nerves). Involuntary functions are functions of the body that you do not control. They include heartbeat, body temperature, blood pressure, urination, digestion, sweating, sexual function, or response to changes in blood glucose.  Focal neuropathy. This type of nerve damage affects one area of the body, such as an arm, a leg, or the face. The injury may involve one nerve or a small group of nerves. Focal neuropathy can be painful and unpredictable. It occurs most often in older adults with diabetes. This often develops suddenly, but usually improves over time and does not cause long-term problems.  Proximal neuropathy. This type of nerve damage affects the nerves of the thighs, hips, buttocks, or legs. It causes severe pain, weakness, and muscle death (atrophy), usually in the thigh muscles. It is more common among older men and people who have type 2 diabetes. The length of recovery time may vary. What are the causes? Peripheral, autonomic, and focal neuropathies are caused by diabetes that is not well controlled with treatment. The cause of proximal neuropathy is not known, but it may be caused by inflammation related to uncontrolled blood glucose levels. What are the signs or symptoms? Peripheral neuropathy Peripheral neuropathy develops slowly over time. When the nerves of the feet and legs no longer work, you may  experience:  Burning, stabbing, or aching pain in the legs or feet.  Pain or cramping in the legs or feet.  Loss of feeling (numbness) and inability to feel pressure or pain in the feet. This can lead to: ? Thick calluses or sores on areas of constant pressure. ? Ulcers. ? Reduced ability to feel temperature changes.  Foot deformities.  Muscle weakness.  Loss of balance or coordination. Autonomic neuropathy The symptoms of autonomic neuropathy vary depending on which nerves are affected. Symptoms may include:  Problems with digestion, such as: ? Nausea or vomiting. ? Poor appetite. ? Bloating. ? Diarrhea or constipation. ? Trouble swallowing. ? Losing weight without trying to.  Problems with the heart, blood, and lungs, such as: ? Dizziness, especially when standing up. ? Fainting. ? Shortness of breath. ? Irregular heartbeat.  Bladder problems, such as: ? Trouble starting or stopping urination. ? Leaking urine. ? Trouble emptying the bladder. ? Urinary tract infections (UTIs).  Problems with other body functions, such as: ? Sweat. You may sweat too much or too little. ? Temperature. You might get hot easily. Or, you might feel cold more than usual. ? Sexual function. Men may not be able to get or maintain an erection. Women may have vaginal dryness and difficulty with arousal. Focal neuropathy Symptoms affect only one area of the body. Common symptoms include:  Numbness.  Tingling.  Burning pain.  Prickling feeling.  Very sensitive skin.  Weakness.  Inability to move (paralysis).  Muscle twitching.  Muscles getting smaller (wasting).  Poor coordination.  Double or blurred vision. Proximal neuropathy  Sudden, severe pain in the hip, thigh, or buttocks. Pain may spread from the back into the legs (  sciatica).  Pain and numbness in the arms and legs.  Tingling.  Loss of bladder control or bowel control.  Weakness and wasting of thigh  muscles.  Difficulty getting up from a seated position.  Abdominal swelling.  Unexplained weight loss. How is this diagnosed? Diagnosis varies depending on the type of neuropathy your health care provider suspects. Peripheral neuropathy Your health care provider will do a neurologic exam. This exam checks your reflexes, how you move, and what you can feel. You may have other tests, such as:  Blood tests.  Tests of the fluid that surrounds the spinal cord (lumbar puncture).  CT scan.  MRI.  Checking the nerves that control muscles (electromyogram, or EMG).  Checking how quickly signals pass through your nerves (nerve conduction study).  Checking a small piece of a nerve using a microscope (biopsy). Autonomic neuropathy You may have tests, such as:  Tests to measure your blood pressure and heart rate. You may be secured to an exam table that moves you from a lying position to an upright position (table tilt test).  Breathing tests to check your lungs.  Tests to check how food moves through the digestive system (gastric emptying tests).  Blood, sweat, or urine tests.  Ultrasound of your bladder.  Spinal fluid tests. Focal neuropathy This condition may be diagnosed with:  A neurologic exam.  CT scan.  MRI.  EMG.  Nerve conduction study. Proximal neuropathy There is no test to diagnose this type of neuropathy. You may have tests to rule out other possible causes of this type of neuropathy. Tests may include:  X-rays of your spine and lumbar region.  Lumbar puncture.  MRI. How is this treated? The goal of treatment is to keep nerve damage from getting worse. Treatment may include:  Following your diabetes management plan. This will help keep your blood glucose level and your A1C level within your target range. This is the most important treatment.  Using prescription pain medicine. Follow these instructions at home: Diabetes management Follow your diabetes  management plan as told by your health care provider.  Check your blood glucose levels.  Keep your blood glucose in your target range.  Have your A1C level checked at least two times a year, or as often as told.  Take over the counter and prescription medicines only as told by your health care provider. This includes insulin and diabetes medicine.   Lifestyle  Do not use any products that contain nicotine or tobacco, such as cigarettes, e-cigarettes, and chewing tobacco. If you need help quitting, ask your health care provider.  Be physically active every day. Include strength training and balance exercises.  Follow a healthy meal plan.  Work with your health care provider to manage your blood pressure.   General instructions  Ask your health care provider if the medicine prescribed to you requires you to avoid driving or using machinery.  Check your skin and feet every day for cuts, bruises, redness, blisters, or sores.  Keep all follow-up visits. This is important. Contact a health care provider if:  You have burning, stabbing, or aching pain in your legs or feet.  You are unable to feel pressure or pain in your feet.  You develop problems with digestion, such as: ? Nausea. ? Vomiting. ? Bloating. ? Constipation. ? Diarrhea. ? Abdominal pain.  You have difficulty with urination, such as: ? Inability to control when you urinate (incontinence). ? Inability to completely empty the bladder (retention).  You feel  as if your heart is racing (palpitations).  You feel dizzy, weak, or faint when you stand up. Get help right away if:  You cannot urinate.  You have sudden weakness or loss of coordination.  You have trouble speaking.  You have pain or pressure in your chest.  You have an irregular heartbeat.  You have sudden inability to move a part of your body. These symptoms may represent a serious problem that is an emergency. Do not wait to see if the symptoms  will go away. Get medical help right away. Call your local emergency services (911 in the [place] Do not drive yourself to the hospital. Summary  Diabetic neuropathy is nerve damage that is caused by diabetes. It can cause numbness and pain in the arms, legs, digestive tract, heart, and other body systems.  This condition is treated by keeping your blood glucose level and your A1C level within your target range. This can help prevent neuropathy from getting worse.  Check your skin and feet every day for cuts, bruises, redness, blisters, or sores.  Do not use any products that contain nicotine or tobacco, such as cigarettes, e-cigarettes, and chewing tobacco. This information is not intended to replace advice given to you by your health care provider. Make sure you discuss any questions you have with your health care provider. Document Revised: 05/18/2019 Document Reviewed: 05/18/2019 Elsevier Patient Education  [place].

## 2020-02-23 NOTE — Telephone Encounter (Signed)
noted 

## 2020-02-23 NOTE — Progress Notes (Signed)
Subjective:  Patient ID: Brenda Davis, female    DOB: February 02, 1965  Age: 55 y.o. MRN: [id number]  CC: Anemia, Diabetes, and Hypertension  This visit occurred during the SARS-CoV-2 public health emergency.  Safety protocols were in place, including screening questions prior to the visit, additional usage of staff PPE, and extensive cleaning of exam room while observing appropriate contact time as indicated for disinfecting solutions.    HPI [name] presents for f/up -  She is recovering from a COVID-19 infection and is feeling better but she tells me she has not slept for the last 3 nights because she has pain in both of her feet.  She describes the pain as a dull ache and a burning sensation.  The pain interferes with her sleep.  She is not getting much symptom relief with gabapentin and acetaminophen.  Outpatient Medications Prior to Visit  Medication Sig Dispense Refill  . acetaminophen (TYLENOL) 500 MG tablet Take 1,000 mg by mouth every 6 (six) hours as needed for moderate pain or headache.    . B Complex-C-Biotin-D-FA (DIALYVITE 800 PLUS D) 800 MCG WAFR Take by mouth.    . calcitRIOL (ROCALTROL) 0.5 MCG capsule Take 1.5 mcg by mouth daily.     . calcium acetate (PHOSLO) 667 MG capsule Take 667-2,001 mg by mouth See admin instructions. Take 2001 mg with each meal and 667 mg with each snack    . carvedilol (COREG) 25 MG tablet Take 12.5 mg by mouth 2 (two) times daily.     . cetirizine (ZYRTEC) 5 MG tablet Take 1 tablet 3 times weekly. 30 tablet 0  . ferric citrate (AURYXIA) 1 GM 210 MG(Fe) tablet Take 2 tablets (420 mg total) by mouth 3 (three) times daily with meals. 270 tablet 0  . gabapentin (NEURONTIN) 100 MG capsule Take 100 mg by mouth 2 (two) times daily.    . isosorbide dinitrate (ISORDIL) 20 MG tablet Take 1 tablet (20 mg total) by mouth 2 (two) times daily. 180 tablet 3  . Methoxy PEG-Epoetin Beta (MIRCERA IJ) Inject into the skin.    . mupirocin  ointment (BACTROBAN) 2 % Apply topically.    . nitroGLYCERIN (NITROSTAT) 0.4 MG SL tablet TAKE 1 TABLET BY MOUTH UNDER THE TONGUE EVERY 5 MINUTES AS NEEDED FOR CHEST PAIN (Patient taking differently: Place 0.4 mg under the tongue every 5 (five) minutes as needed for chest pain.) 50 tablet 2  . ondansetron (ZOFRAN) 4 MG tablet Take 1 tablet (4 mg total) by mouth every 8 (eight) hours as needed for nausea or vomiting. 20 tablet 0  . rosuvastatin (CRESTOR) 20 MG tablet Take 1 tablet (20 mg total) by mouth daily. 90 tablet 3  . aspirin EC 81 MG EC tablet Take 1 tablet (81 mg total) by mouth daily. 30 tablet 0  . benzonatate (TESSALON) 100 MG capsule Take 1 capsule (100 mg total) by mouth 2 (two) times daily as needed for cough. 30 capsule 0  . folic acid (FOLVITE) 1 MG tablet Take 1 tablet (1 mg total) by mouth daily. 90 tablet 1  . 0.9 %  sodium chloride infusion     . sodium chloride flush (NS) 0.9 % injection 3 mL     . sodium chloride flush (NS) 0.9 % injection 3 mL      No facility-administered medications prior to visit.    ROS Review of Systems  Constitutional: Positive for fatigue. Negative for appetite change, chills, diaphoresis and unexpected weight change.  HENT: Negative.   Eyes: Negative.   Respiratory: Negative for cough, chest tightness, shortness of breath and wheezing.   Cardiovascular: Negative for chest pain, palpitations and leg swelling.  Gastrointestinal: Negative for abdominal pain, blood in stool, diarrhea and nausea.  Endocrine: Negative.  Negative for polydipsia, polyphagia and polyuria.  Genitourinary: Negative.  Negative for difficulty urinating and dysuria.  Musculoskeletal: Negative for arthralgias and myalgias.  Skin: Positive for pallor. Negative for color change and rash.  Neurological: Negative.  Negative for dizziness, weakness, light-headedness, numbness and headaches.  Hematological: Negative for adenopathy. Does not bruise/bleed easily.   Psychiatric/Behavioral: Negative.     Objective:  BP 126/82   Pulse 75   Temp 98.6 F (37 C) (Oral)   Ht 5' 5"  (1.651 m)   Wt 280 lb (127 kg)   SpO2 98%   BMI 46.59 kg/m   BP Readings from Last 3 Encounters:  02/23/20 126/82  02/13/20 (!) 100/59  02/02/20 106/78    Wt Readings from Last 3 Encounters:  02/23/20 280 lb (127 kg)  02/12/20 281 lb (127.5 kg)  02/02/20 281 lb 3.2 oz (127.6 kg)    Physical Exam Vitals reviewed.  HENT:     Nose: Nose normal.     Mouth/Throat:     Mouth: Mucous membranes are moist. Mucous membranes are pale.  Eyes:     General: No scleral icterus.    Conjunctiva/sclera: Conjunctivae normal.  Cardiovascular:     Rate and Rhythm: Normal rate and regular rhythm.     Pulses: Normal pulses.     Heart sounds: No murmur heard. No gallop.   Pulmonary:     Effort: Pulmonary effort is normal.     Breath sounds: No stridor. No wheezing, rhonchi or rales.  Abdominal:     General: Abdomen is protuberant. Bowel sounds are normal. There is no distension.     Palpations: Abdomen is soft. There is no hepatomegaly, splenomegaly or mass.     Tenderness: There is no abdominal tenderness.  Musculoskeletal:        General: Normal range of motion.     Cervical back: Neck supple.     Right lower leg: No edema.     Left lower leg: No edema.  Lymphadenopathy:     Cervical: No cervical adenopathy.  Skin:    General: Skin is warm and dry.     Coloration: Skin is pale.  Neurological:     General: No focal deficit present.     Mental Status: She is alert.  Psychiatric:        Mood and Affect: Mood normal.        Behavior: Behavior normal.     Lab Results  Component Value Date   WBC 5.8 02/23/2020   HGB 8.9 Repeated and verified X2. (L) 02/23/2020   HCT 26.5 (L) 02/23/2020   PLT 391.0 02/23/2020   GLUCOSE 78 02/12/2020   CHOL 185 01/16/2019   TRIG 150 (H) 01/16/2019   HDL 39 (L) 01/16/2019   LDLDIRECT 161.6 11/28/2012   LDLCALC 116 (H)  01/16/2019   ALT 14 03/14/2019   AST 16 03/14/2019   NA 132 (L) 02/12/2020   K 4.4 02/12/2020   CL 91 (L) 02/12/2020   CREATININE 11.44 (H) 02/12/2020   BUN 72 (H) 02/12/2020   CO2 22 02/12/2020   TSH 3.39 02/23/2020   INR 1.0 05/27/2018   HGBA1C 6.3 02/23/2020   MICROALBUR renal 04/12/2018    No results found.  Assessment &  Plan:   Doshie was seen today for anemia, diabetes and hypertension.  Diagnoses and all orders for this visit:  Type 2 diabetes mellitus with chronic kidney disease on chronic dialysis, with long-term current use of insulin ([place])- Her A1c is at 6.3%.  Her blood sugar is adequately well controlled. -     Hemoglobin A1c; Future -     HM Diabetes Foot Exam -     Hemoglobin A1c  Anemia of chronic disease -     Ferritin; Future -     Iron; Future -     CBC with Differential/Platelet; Future -     CBC with Differential/Platelet -     Iron -     Ferritin  Vitamin B12 deficiency anemia due to intrinsic factor deficiency- Her B12 level is normal now. -     Folate; Future -     Vitamin B12; Future -     Vitamin B12 -     Folate  Deficiency anemia  Folate deficiency- She remains anemic and her folate level is low.  I have asked her to restart the folate supplement. -     Folate; Future -     Vitamin B12; Future -     Vitamin B12 -     Folate -     folic acid (FOLVITE) 1 MG tablet; Take 1 tablet (1 mg total) by mouth daily.  Thiamine deficiency- I will monitor her thiamine level. -     Vitamin B1; Future -     Vitamin B1  Hypertension, unspecified type- Her blood pressure is adequately well controlled. -     TSH; Future -     TSH  Diabetic neuropathy, painful (HCC) -     oxyCODONE (OXY IR/ROXICODONE) 5 MG immediate release tablet; Take 1 tablet (5 mg total) by mouth every 6 (six) hours as needed for severe pain.   I have discontinued Samariah Hokenson. Goggans-Brockman's aspirin and benzonatate. I am also having her start on oxyCODONE. Additionally, I am  having her maintain her calcium acetate, gabapentin, ferric citrate, nitroGLYCERIN, calcitRIOL, acetaminophen, Dialyvite 800 Plus D, Methoxy PEG-Epoetin Beta (MIRCERA IJ), mupirocin ointment, carvedilol, isosorbide dinitrate, rosuvastatin, cetirizine, ondansetron, and folic acid. We will stop administering sodium chloride flush, sodium chloride flush, and sodium chloride.  Meds ordered this encounter  Medications  . oxyCODONE (OXY IR/ROXICODONE) 5 MG immediate release tablet    Sig: Take 1 tablet (5 mg total) by mouth every 6 (six) hours as needed for severe pain.    Dispense:  90 tablet    Refill:  0  . folic acid (FOLVITE) 1 MG tablet    Sig: Take 1 tablet (1 mg total) by mouth daily.    Dispense:  90 tablet    Refill:  1     Follow-up: Return in about 3 months (around 05/22/2020).  Scarlette Calico, MD

## 2020-02-23 NOTE — Telephone Encounter (Signed)
Follow up message  Patient contacted insurance on her own NO need to obtain auth Patient has picked up medication

## 2020-02-23 NOTE — Telephone Encounter (Signed)
She now states that they need a prior auth for the medication so she doesn't have to pay out of pocket

## 2020-02-25 DIAGNOSIS — E1122 Type 2 diabetes mellitus with diabetic chronic kidney disease: Secondary | ICD-10-CM | POA: Diagnosis not present

## 2020-02-25 DIAGNOSIS — N186 End stage renal disease: Secondary | ICD-10-CM | POA: Diagnosis not present

## 2020-02-25 DIAGNOSIS — D631 Anemia in chronic kidney disease: Secondary | ICD-10-CM | POA: Diagnosis not present

## 2020-02-25 DIAGNOSIS — E8779 Other fluid overload: Secondary | ICD-10-CM | POA: Diagnosis not present

## 2020-02-25 DIAGNOSIS — Z992 Dependence on renal dialysis: Secondary | ICD-10-CM | POA: Diagnosis not present

## 2020-02-25 DIAGNOSIS — I132 Hypertensive heart and chronic kidney disease with heart failure and with stage 5 chronic kidney disease, or end stage renal disease: Secondary | ICD-10-CM | POA: Diagnosis not present

## 2020-02-25 DIAGNOSIS — N2581 Secondary hyperparathyroidism of renal origin: Secondary | ICD-10-CM | POA: Diagnosis not present

## 2020-02-25 DIAGNOSIS — D509 Iron deficiency anemia, unspecified: Secondary | ICD-10-CM | POA: Diagnosis not present

## 2020-02-25 DIAGNOSIS — I509 Heart failure, unspecified: Secondary | ICD-10-CM | POA: Diagnosis not present

## 2020-02-26 DIAGNOSIS — E1122 Type 2 diabetes mellitus with diabetic chronic kidney disease: Secondary | ICD-10-CM | POA: Diagnosis not present

## 2020-02-26 DIAGNOSIS — D509 Iron deficiency anemia, unspecified: Secondary | ICD-10-CM | POA: Diagnosis not present

## 2020-02-26 DIAGNOSIS — Z992 Dependence on renal dialysis: Secondary | ICD-10-CM | POA: Diagnosis not present

## 2020-02-26 DIAGNOSIS — N186 End stage renal disease: Secondary | ICD-10-CM | POA: Diagnosis not present

## 2020-02-26 DIAGNOSIS — D631 Anemia in chronic kidney disease: Secondary | ICD-10-CM | POA: Diagnosis not present

## 2020-02-26 DIAGNOSIS — I132 Hypertensive heart and chronic kidney disease with heart failure and with stage 5 chronic kidney disease, or end stage renal disease: Secondary | ICD-10-CM | POA: Diagnosis not present

## 2020-02-26 DIAGNOSIS — E8779 Other fluid overload: Secondary | ICD-10-CM | POA: Diagnosis not present

## 2020-02-26 DIAGNOSIS — N2581 Secondary hyperparathyroidism of renal origin: Secondary | ICD-10-CM | POA: Diagnosis not present

## 2020-02-26 DIAGNOSIS — I509 Heart failure, unspecified: Secondary | ICD-10-CM | POA: Diagnosis not present

## 2020-02-27 ENCOUNTER — Telehealth: Payer: Self-pay | Admitting: Cardiovascular Disease

## 2020-02-27 DIAGNOSIS — E1122 Type 2 diabetes mellitus with diabetic chronic kidney disease: Secondary | ICD-10-CM | POA: Diagnosis not present

## 2020-02-27 DIAGNOSIS — D509 Iron deficiency anemia, unspecified: Secondary | ICD-10-CM | POA: Diagnosis not present

## 2020-02-27 DIAGNOSIS — E8779 Other fluid overload: Secondary | ICD-10-CM | POA: Diagnosis not present

## 2020-02-27 DIAGNOSIS — I132 Hypertensive heart and chronic kidney disease with heart failure and with stage 5 chronic kidney disease, or end stage renal disease: Secondary | ICD-10-CM | POA: Diagnosis not present

## 2020-02-27 DIAGNOSIS — I509 Heart failure, unspecified: Secondary | ICD-10-CM | POA: Diagnosis not present

## 2020-02-27 DIAGNOSIS — Z992 Dependence on renal dialysis: Secondary | ICD-10-CM | POA: Diagnosis not present

## 2020-02-27 DIAGNOSIS — N2581 Secondary hyperparathyroidism of renal origin: Secondary | ICD-10-CM | POA: Diagnosis not present

## 2020-02-27 DIAGNOSIS — N186 End stage renal disease: Secondary | ICD-10-CM | POA: Diagnosis not present

## 2020-02-27 DIAGNOSIS — D631 Anemia in chronic kidney disease: Secondary | ICD-10-CM | POA: Diagnosis not present

## 2020-02-27 NOTE — Telephone Encounter (Signed)
Spoke to patient she stated she had covid 02/06/20.Stated she was very sick for several weeks.She was unable to eat and drink.Stated she has been having palpitations and heaviness in chest.No chest heaviness at present. No extender appointments available.Appointment scheduled with Dr.[place] 02/28/20 at 9:40 am.

## 2020-02-27 NOTE — Telephone Encounter (Signed)
Patient c/o Palpitations:  High priority if patient c/o lightheadedness, shortness of breath, or chest pain  1) How long have you had palpitations/irregular HR/ Afib? Are you having the symptoms now? Not having symptoms right now, has been going on for the past 3 days.  2) Are you currently experiencing lightheadedness, SOB or CP? SOB   3) Do you have a history of afib (atrial fibrillation) or irregular heart rhythm? AFIB  4) Have you checked your BP or HR? (document readings if available): 107/74 HR 80  5) Are you experiencing any other symptoms? No.  Patient states that every time she has dialysis she experiences palpitations and SOB. She states that she took 2 tablets of nitroGLYCERIN (NITROSTAT) 0.4 MG SL tablet. Please advise.

## 2020-02-28 ENCOUNTER — Encounter: Payer: Self-pay | Admitting: Cardiovascular Disease

## 2020-02-28 ENCOUNTER — Telehealth: Payer: Self-pay | Admitting: Cardiovascular Disease

## 2020-02-28 ENCOUNTER — Ambulatory Visit (INDEPENDENT_AMBULATORY_CARE_PROVIDER_SITE_OTHER): Payer: Medicare HMO | Admitting: Cardiovascular Disease

## 2020-02-28 ENCOUNTER — Other Ambulatory Visit: Payer: Self-pay

## 2020-02-28 VITALS — BP 137/71 | HR 67 | Ht 65.0 in | Wt 279.2 lb

## 2020-02-28 DIAGNOSIS — I471 Supraventricular tachycardia, unspecified: Secondary | ICD-10-CM

## 2020-02-28 DIAGNOSIS — N186 End stage renal disease: Secondary | ICD-10-CM | POA: Diagnosis not present

## 2020-02-28 DIAGNOSIS — I10 Essential (primary) hypertension: Secondary | ICD-10-CM

## 2020-02-28 DIAGNOSIS — I5032 Chronic diastolic (congestive) heart failure: Secondary | ICD-10-CM

## 2020-02-28 DIAGNOSIS — R002 Palpitations: Secondary | ICD-10-CM | POA: Diagnosis not present

## 2020-02-28 DIAGNOSIS — D649 Anemia, unspecified: Secondary | ICD-10-CM

## 2020-02-28 DIAGNOSIS — Z6841 Body Mass Index (BMI) 40.0 and over, adult: Secondary | ICD-10-CM

## 2020-02-28 DIAGNOSIS — Z992 Dependence on renal dialysis: Secondary | ICD-10-CM

## 2020-02-28 DIAGNOSIS — E785 Hyperlipidemia, unspecified: Secondary | ICD-10-CM

## 2020-02-28 DIAGNOSIS — I25110 Atherosclerotic heart disease of native coronary artery with unstable angina pectoris: Secondary | ICD-10-CM

## 2020-02-28 HISTORY — DX: Supraventricular tachycardia, unspecified: I47.10

## 2020-02-28 HISTORY — DX: Supraventricular tachycardia: I47.1

## 2020-02-28 MED ORDER — DILTIAZEM HCL ER COATED BEADS 120 MG PO CP24: 120.0000 mg | ORAL_CAPSULE | Freq: Every day | ORAL | 3 refills | Status: AC

## 2020-02-28 NOTE — Telephone Encounter (Signed)
Patient made aware of PharmD's recommendations below. Patient confirmed the only swelling reaction she had to Amlodipine was in her legs/ankles. She denies experiencing any angioedema symptoms while on the drug.   Patient agreeable to try Diltiazem. Encouraged patient to reach back out if she has any new symptoms or concerns.   Supple, Megan E, RPH-CPP 8 minutes ago (4:38 PM)      Pt's reported allergy to amlodipine is swelling. Would clarify with pt that this was in her lower extremities and not elsewhere (angioedema etc). As long as swelling was in her lower extremities, she's fine to start diltiazem. Mechanistically, amlodipine is much more likely to cause LEE than diltiazem is.

## 2020-02-28 NOTE — Patient Instructions (Signed)
Medication Instructions:  START DILTIAZEM 120 MG DAILY   *If you need a refill on your cardiac medications before your next appointment, please call your pharmacy*  Lab Work: CMET/CBC/MAGNESIUM TODAY   If you have labs (blood work) drawn today and your tests are completely normal, you will receive your results only by: Marland Kitchen MyChart Message (if you have MyChart) OR . A paper copy in the mail If you have any lab test that is abnormal or we need to change your treatment, we will call you to review the results.  Testing/Procedures: NONE   Follow-Up: At [facility], you and your health needs are our priority.  As part of our continuing mission to provide you with exceptional heart care, we have created designated Provider Care Teams.  These Care Teams include your primary Cardiologist (physician) and Advanced Practice Providers (APPs -  Physician Assistants and Nurse Practitioners) who all work together to provide you with the care you need, when you need it.  We recommend signing up for the patient portal called "MyChart".  Sign up information is provided on this After Visit Summary.  MyChart is used to connect with patients for Virtual Visits (Telemedicine).  Patients are able to view lab/test results, encounter notes, upcoming appointments, etc.  Non-urgent messages can be sent to your provider as well.   To learn more about what you can do with MyChart, go to [email or web address].    Your next appointment:   12 month(s)  The format for your next appointment:   In Person  Provider:   You may see Skeet Latch, MD or one of the following Advanced Practice Providers on your designated Care Team:    Kerin Ransom, PA-C  [place], [place]  Coletta Memos, [place]

## 2020-02-28 NOTE — Telephone Encounter (Signed)
    Pt c/o medication issue:  1. Name of Medication: diltiazem (CARDIZEM CD) 120 MG 24 hr capsule  2. How are you currently taking this medication (dosage and times per day)? Take 1 capsule (120 mg total) by mouth daily.  3. Are you having a reaction (difficulty breathing--STAT)?   4. What is your medication issue? Pt said her husband tried to pick up this prescription, while at the pharmacy she was asked what meds she is allergic to, she said amlodipine, the pharmacy told her diltiazem has some same ingredients in amlodipine and advised to call Dr. Oval Linsey if she needs to change prescription.

## 2020-02-28 NOTE — Telephone Encounter (Signed)
Pt's reported allergy to amlodipine is swelling. Would clarify with pt that this was in her lower extremities and not elsewhere (angioedema etc). As long as swelling was in her lower extremities, she's fine to start diltiazem. Mechanistically, amlodipine is much more likely to cause LEE than diltiazem is.

## 2020-02-28 NOTE — Progress Notes (Signed)
Cardiology Office Note   Date:  02/28/2020   ID:  Brenda Davis, DOB [birth year], MRN [id number]  PCP:  [name], MD  Cardiologist:  Skeet Latch, MD  Electrophysiologist:  None   Evaluation Performed:  Follow-Up Visit  Chief Complaint:  CAD   History of Present Illness:    Brenda Davis is a 55 y.o. female with moderate CAD, diabetes, hypertension, hyperlipidemia, ESRD on PD here for follow up.  She was initially seen in 2018 for an evaluation of palpitations.  She saw Dr. Marval Regal and reported palpitations. She was referred to cardiology for further evaluation.  Whenever she is on dialysis she has palpitations during the last two hours.  It feels like her heart is fluttering and she feels like she is going to pass out.  It gets progressively worse during that time period and she gets short of breath.  Once she stops HD the symptoms subside.  Her BP is typically in the 403K systolic.  It drops to the 100s while on hemodialysis.   She wore a 48-hour Holter that showed no arrhythmias and occasional PACs.  She had an echo 01/04/15 revealed LVEF 65-70% with grade 2 diastolic dysfunction and mild mitral regurgitation.  She presented to the hospital 06/2018 with a NSTEMI. She underwent left heart cath and was found to have moderate disease.  FFR of the RCA was within normal limits.  Medical therapy was recommended.  She was started on Imdur and atorvastatin.  However she did not tolerate nitrates.  She followed up with Jory Sims, DNP and was started on gabapentin for concern for neuropathic pain.  Since that time she has been feeling much better.  She has needed nitro at approximately twice per month.  She only takes 1 tablet when it occurs.  Her chest pain always occurs at rest.  She has not been getting much exercise lately.  She notes that she has gained 30 pounds.  She plans to start walking her lunch break and working on her diet.  She has been trying to  limit fried foods but craves them.  She checks her blood pressure twice daily.  It is typically low in the morning when she finishes her peritoneal dialysis.  In the afternoon it is higher, but it stays below the 742V systolic.  Her nephrologist Dr. Atorvastatin in September due to elevated LFTs.  She reports that they normalized on repeat lab evaluation.  Brenda Davis was admitted 12/2018 with NSTEMI.  She underwent DES to the proximal RCA and OM1.  She also had a 75% LAD lesion that was medically managed.  That hospitalization was complicated by a R femoral groin hematoma that required exploration and then developed an ulcer.  She presented ot the ED 02/2019 with unstable angina.  She was found to have stent thrombosis of the OM stent.  It was not amenable to PCI and medical management was recommended.  During that hospitalization she developed Covid pneumonia and sepsis.  She tested negative for Covid on admission but subsequently tested positive during the hospitalization.  Her husband also tested positive and had been at home, therefore this is not thought to be hospital-acquired.  She was treated with remdesivir and steroids.  Blood cultres were positive for Staph capitis.  She was treated with vancomycin/cefepime and then transitioned to doxycycline.   Brenda Davis saw our APP and was referred for an event monitor that showed 6 beats of SVT and rare PVCs. At her last  appointment Brenda Davis reported palpitations and chest tightness. Her blood pressure was also poorly controlled. Carvedilol and Isordil were both increased.  She was diagnosed with COVID-19 on 1/18.  She had fever and diarrhea and emesis.  She didn't eat for 14 days due to emesis and lack of taste.  She tried to get monoclonal antibodies but her outpatient appointment was cancelled for unclear reasons.  She spent all night in the ED and was told that she was outside the window for treatment.  Since then her weight has  been up and down.  She feels like she is retaining fluid.  She is working with her dialysis team.  She has struggled with her BP being high and having palpitations.  Last night she started having more palpitations.  They are persisting now.  It makes her feel more short of breath.  Past Medical History:  Diagnosis Date  . Anemia   . CAD (coronary artery disease)    a. cath in 02/2018 showing moderate 3-vessel CAD with 45% mid-LADm 55% LCx and 65% RCA stenosis which was not significant by FFR  . ESRD on dialysis ([place])   . Gout   . HCAP (healthcare-associated pneumonia) 05/06/2016  . History of blood transfusion    "related to surgery"  . Hyperlipidemia   . Hypertension   . Morbid obesity ([place])   . Pain    LEFT SHOULDER PAIN - WAS SEEN AT AN URGENT CARE - GIVEN SLING FOR COMFORT AND TOLD ROM AS TOLERATED.  Marland Kitchen Palpitations 09/24/2016  . Peritonitis, dialysis-associated ([place])   . SVT (supraventricular tachycardia) ([place]) 02/28/2020  . Type II diabetes mellitus ([place])    "gastric sleeve OR corrected this" (05/06/2016)   Past Surgical History:  Procedure Laterality Date  . AV FISTULA PLACEMENT Left 01/03/2015   Procedure: BRACHIAL CEPHALIC ARTERIOVENOUS  FISTULA CREATION LEFT ARM;  Surgeon: Angelia Mould, MD;  Location: [place];  Service: Vascular;  Laterality: Left;  . CARPAL TUNNEL RELEASE Right   . [place]  . CHOLECYSTECTOMY  09/14/2017  . CORONARY STENT INTERVENTION N/A 01/17/2019   Procedure: CORONARY STENT INTERVENTION;  Surgeon: Jettie Booze, MD;  Location: [place] [facility];  Service: Cardiovascular;  Laterality: N/A;  . ESOPHAGOGASTRODUODENOSCOPY N/A 09/04/2012   Procedure: ESOPHAGOGASTRODUODENOSCOPY (EGD);  Surgeon: Shann Medal, MD;  Location: [facility];  Service: General;  Laterality: N/A;  PF  . ESOPHAGOGASTRODUODENOSCOPY (EGD) WITH ESOPHAGEAL DILATION N/A 09/29/2012   Procedure: ESOPHAGOGASTRODUODENOSCOPY (EGD) WITH ESOPHAGEAL DILATION;   Surgeon: Milus Banister, MD;  Location: [facility];  Service: Endoscopy;  Laterality: N/A;  . FEMORAL ARTERY EXPLORATION Right 01/17/2019   Procedure: Exploration Right Groin with Primary Closure or Arteriotomy Site; Washout of Retroperitoneal Hematoma;  Surgeon: Waynetta Sandy, MD;  Location: [place];  Service: Vascular;  Laterality: Right;  . INSERTION OF DIALYSIS CATHETER N/A 01/03/2015   Procedure: INSERTION OF DIALYSIS CATHETER RIGHT INTERNAL JUGULAR;  Surgeon: Angelia Mould, MD;  Location: [place];  Service: Vascular;  Laterality: N/A;  . LAPAROSCOPIC GASTRIC SLEEVE RESECTION N/A 07/19/2012   Procedure: LAPAROSCOPIC SLEEVE GASTRECTOMY with EGD;  Surgeon: Madilyn Hook, DO;  Location: [facility];  Service: General;  Laterality: N/A;  laparoscopic sleeve gastrectomy with EGD  . LEFT HEART CATH AND CORONARY ANGIOGRAPHY N/A 02/25/2018   Procedure: LEFT HEART CATH AND CORONARY ANGIOGRAPHY;  Surgeon: Leonie Man, MD;  Location: [place] [facility];  Service: Cardiovascular;  Laterality: N/A;  . LEFT HEART CATH AND  CORONARY ANGIOGRAPHY N/A 06/20/2018   Procedure: LEFT HEART CATH AND CORONARY ANGIOGRAPHY;  Surgeon: Nelva Bush, MD;  Location: [place] [facility];  Service: Cardiovascular;  Laterality: N/A;  . LEFT HEART CATH AND CORONARY ANGIOGRAPHY N/A 01/16/2019   Procedure: LEFT HEART CATH AND CORONARY ANGIOGRAPHY;  Surgeon: Jettie Booze, MD;  Location: [place] [facility];  Service: Cardiovascular;  Laterality: N/A;  . LEFT HEART CATH AND CORONARY ANGIOGRAPHY N/A 03/03/2019   Procedure: LEFT HEART CATH AND CORONARY ANGIOGRAPHY;  Surgeon: Jettie Booze, MD;  Location: [place] [facility];  Service: Cardiovascular;  Laterality: N/A;  . PERIPHERAL VASCULAR BALLOON ANGIOPLASTY Left 08/18/2019   Procedure: PERIPHERAL VASCULAR BALLOON ANGIOPLASTY;  Surgeon: Marty Heck, MD;  Location: [place] [facility];  Service: Cardiovascular;  Laterality: Left;  arm fistula  .  PERIPHERAL VASCULAR CATHETERIZATION Left 05/23/2015   Procedure: Nolon Stalls;  Surgeon: Conrad Maitland, MD;  Location: [place] [facility];  Service: Cardiovascular;  Laterality: Left;  upper aRM  . PERIPHERAL VASCULAR CATHETERIZATION Left 05/23/2015   Procedure: Peripheral Vascular Balloon Angioplasty;  Surgeon: Conrad [place], MD;  Location: [place] [facility];  Service: Cardiovascular;  Laterality: Left;  av fistula  . TUBAL LIGATION  1993  . UPPER GI ENDOSCOPY N/A 07/19/2012   Procedure: UPPER GI ENDOSCOPY;  Surgeon: Madilyn Hook, DO;  Location: [facility];  Service: General;  Laterality: N/A;     Current Meds  Medication Sig  . acetaminophen (TYLENOL) 500 MG tablet Take 1,000 mg by mouth every 6 (six) hours as needed for moderate pain or headache.  . B Complex-C-Biotin-D-FA (DIALYVITE 800 PLUS D) 800 MCG WAFR Take by mouth.  . calcitRIOL (ROCALTROL) 0.5 MCG capsule Take 1.5 mcg by mouth daily.   . calcium acetate (PHOSLO) 667 MG capsule Take 667-2,001 mg by mouth See admin instructions. Take 2001 mg with each meal and 667 mg with each snack  . carvedilol (COREG) 25 MG tablet Take 25 mg by mouth 2 (two) times daily.  Marland Kitchen diltiazem (CARDIZEM CD) 120 MG 24 hr capsule Take 1 capsule (120 mg total) by mouth daily.  . ferric citrate (AURYXIA) 1 GM 210 MG(Fe) tablet Take 2 tablets (420 mg total) by mouth 3 (three) times daily with meals.  . gabapentin (NEURONTIN) 100 MG capsule Take 100 mg by mouth 2 (two) times daily.  . isosorbide dinitrate (ISORDIL) 20 MG tablet Take 1 tablet (20 mg total) by mouth 2 (two) times daily.  . mupirocin ointment (BACTROBAN) 2 % Apply topically.  . nitroGLYCERIN (NITROSTAT) 0.4 MG SL tablet TAKE 1 TABLET BY MOUTH UNDER THE TONGUE EVERY 5 MINUTES AS NEEDED FOR CHEST PAIN (Patient taking differently: Place 0.4 mg under the tongue every 5 (five) minutes as needed for chest pain.)  . ondansetron (ZOFRAN) 4 MG tablet Take 1 tablet (4 mg total) by mouth every 8 (eight) hours as needed  for nausea or vomiting.  Marland Kitchen oxyCODONE (OXY IR/ROXICODONE) 5 MG immediate release tablet Take 1 tablet (5 mg total) by mouth every 6 (six) hours as needed for severe pain.  . rosuvastatin (CRESTOR) 20 MG tablet Take 1 tablet (20 mg total) by mouth daily.  . [DISCONTINUED] cetirizine (ZYRTEC) 5 MG tablet Take 1 tablet 3 times weekly.  . [DISCONTINUED] folic acid (FOLVITE) 1 MG tablet Take 1 tablet (1 mg total) by mouth daily.  . [DISCONTINUED] Methoxy PEG-Epoetin Beta (MIRCERA IJ) Inject into the skin.     Allergies:   Amlodipine, Atorvastatin, Clonidine derivatives,  Doxycycline, and Welchol [colesevelam hcl]   Social History   Tobacco Use  . Smoking status: Former Smoker    Packs/day: 1.00    Years: 16.00    Pack years: 16.00    Types: Cigarettes    Quit date: 2009    Years since quitting: 13.1  . Smokeless tobacco: Never Used  Vaping Use  . Vaping Use: Never used  Substance Use Topics  . Alcohol use: Never  . Drug use: No     Family Hx: The patient's family history includes Arthritis in an other family member; Diabetes in her father; Hyperlipidemia in an other family member; Hypertension in her mother; Mitral valve prolapse in her mother. There is no history of Cancer, Heart disease, Kidney disease, or Stroke.  ROS:   Please see the history of present illness.     All other systems reviewed and are negative.   Prior CV studies:    LHC 06/20/18: Conclusions: 1. Moderately severe multivessel coronary artery disease not significantly changed since prior catheterization in 02/2018.  DFR of the most severe lesion in the RCA at that time was not hemodynamically significant. 2. Normal left ventricular filling pressure. 3. Hyperdynamic left ventricular contraction.  Recommendations: 1. Medical therapy for moderate diffuse coronary artery disease.  There is no clear culprit for PCI.  I suspect troponin elevation reflects supply-demand mismatch.  I will start isosorbide mononitrate  30 mg daily, which can be escalated as blood pressure tolerates for relief of chest pain. 2. Aggressive secondary prevention, including high-intensity statin therapy. 3. If no evidence of bleeding complications and no recurrence of chest pain, discharge this afternoon or tomorrow could be considered from a cardiac standpoint.   Cardiac catheterization 01/17/2019  Mid LAD lesion is 75% stenosed. The LAD was not addressed and will be managed medically.  Ost LAD to Mid LAD lesion is 40% stenosed.  Mid Cx to Dist Cx lesion is 55% stenosed with 40% stenosed side branch in Ost 3rd Mrg.  Prox RCA lesion is 75% stenosed.  A drug-eluting stent was successfully placed using a SYNERGY XD 3.0X38, postdilated to 3.5 mm.  Post intervention, there is a 0% residual stenosis.  Ost 1st Mrg lesion is 99% stenosed. EBU4 Guide catheter used.  A drug-eluting stent was successfully placed using a SYNERGY XD 2.25X24, postdilated to 2.75 mm.  Post intervention, there is a 0% residual stenosis.  Continue DAPT for 12 months. Would recommend clopidogrel long-term after one year since she has such diffuse disease. Continue aggressive secondary prevention.  Cardiac Cath 03/03/19  Mid LAD lesion is 75% stenosed.  Ost LAD to Mid LAD lesion is 40% stenosed.  Previously placed Ost 1st Mrg drug eluting stent is widely patent.  Mid Cx to Dist Cx lesion is 55% stenosed with 40% stenosed side branch in Ost 3rd Mrg.  Previously placed Prox RCA drug eluting stent is widely patent.  Ost 1st Mrg to 1st Mrg lesion is 99% stenosed. Partially in the stent and partially distal to the stent- subacte stent thrombosis.  LV end diastolic pressure is normal.  There is no aortic valve stenosis. Medical therapy for stent thrombosis of the OM stent. Given the severe tortuosity, I don't think repeat PCI attempt would be beneficial. There was significant difficulty getting a stent delivered during the last cath. I think  there would be a high likelihood of completely occluding the vessel with wire manipulation.  Echocardiogram 01/16/2019 IMPRESSIONS 1. Left ventricular ejection fraction, by visual estimation, is 65 to  70%. The left ventricle has hyperdynamic function. There is moderately increased left ventricular hypertrophy. 2. Definity contrast agent was given IV to delineate the left ventricular endocardial borders. 3. Left ventricular diastolic parameters are consistent with Grade I diastolic dysfunction (impaired relaxation). 4. The left ventricle has no regional wall motion abnormalities. 5. Global right ventricle has normal systolic function.The right ventricular size is mildly enlarged. No increase in right ventricular wall thickness. 6. Left atrial size was normal. 7. Right atrial size was normal. 8. The pericardial effusion is posterior to the left ventricle. 9. Trivial pericardial effusion is present. 10. The mitral valve was not well visualized. Trivial mitral valve regurgitation. 11. The tricuspid valve is not well visualized. 12. The aortic valve is tricuspid. Aortic valve regurgitation is not visualized. Mild aortic valve sclerosis without stenosis. 13. The pulmonic valve was grossly normal. Pulmonic valve regurgitation is not visualized. 14. The inferior vena cava is normal in size with <50% respiratory variability, suggesting right atrial pressure of 8 mmHg. 15. The interatrial septum was not well visualized.  Labs/Other Tests and Data Reviewed:    EKG:  An ECG dated 05/02/19 was personally reviewed today and demonstrated:  sinus rhythm.  Rate 87 bpm.  Non-specific t wave abnormalities 02/28/2020: Sinus rhythm.  Rate 68 bpm.  LAFB   Recent Labs: 03/14/2019: ALT 14 04/04/2019: Magnesium 1.9 02/12/2020: BUN 72; Creatinine, Ser 11.44; Potassium 4.4; Sodium 132 02/23/2020: Hemoglobin 8.9 Repeated and verified X2.; Platelets 391.0; TSH 3.39   Recent Lipid Panel Lab Results  Component  Value Date/Time   CHOL 185 01/16/2019 04:49 AM   CHOL 191 12/26/2018 10:22 AM   TRIG 150 (H) 01/16/2019 04:49 AM   HDL 39 (L) 01/16/2019 04:49 AM   HDL 44 12/26/2018 10:22 AM   CHOLHDL 4.7 01/16/2019 04:49 AM   LDLCALC 116 (H) 01/16/2019 04:49 AM   LDLCALC 109 (H) 12/26/2018 10:22 AM   LDLDIRECT 161.6 11/28/2012 10:42 AM    Wt Readings from Last 3 Encounters:  02/28/20 279 lb 3.2 oz (126.6 kg)  02/23/20 280 lb (127 kg)  02/12/20 281 lb (127.5 kg)    04/25/19: hgb 11.4,  Total cholesterol 243, triglycerided 152, hdl 45, LDL 168 rosuva 20 mg.  Found the bottle  Objective:    VS:  BP 137/71   Pulse 67   Ht 5' 5"  (1.651 m)   Wt 279 lb 3.2 oz (126.6 kg)   SpO2 100%   BMI 46.46 kg/m  , BMI Body mass index is 46.46 kg/m. GENERAL:  Well appearing HEENT: Pupils equal round and reactive, fundi not visualized, oral mucosa unremarkable NECK:  No jugular venous distention, waveform within normal limits, carotid upstroke brisk and symmetric, no bruits LUNGS:  Clear to auscultation bilaterally HEART:  RRR.  PMI not displaced or sustained,S1 and S2 within normal limits, no S3, no S4, no clicks, no rubs, no murmurs ABD:  Flat, positive bowel sounds normal in frequency in pitch, no bruits, no rebound, no guarding, no midline pulsatile mass, no hepatomegaly, no splenomegaly EXT:  1+ L DP/PT.  2+ R DP/PT. trace edema, no cyanosis no clubbing.  L UE fistula SKIN:  No rashes no nodules NEURO:  Cranial nerves II through XII grossly intact, motor grossly intact throughout PSYCH:  Cognitively intact, oriented to person place and time  ASSESSMENT & PLAN:    # CAD: # Hyperlipidemia:  She had an NSTEMI 12/2018 and was found to have multivessel disease.  She was not felt to be a good  surgical candidate and underwent PCI of the RCA and OM1.  There was residual 75% mid LAD disease, 55% mid left circumflex and otherwise mild to moderate disease.  She developed in-stent thrombosis of the OM that was  medically managed.  She gets chest discomfort when her discomfort when her blood pressure is high.  Carvedilol was increased at her last appointment, as was Isordil.  She is also having a lot of palpitations.  We will start diltiazem 120 mg daily.  Continue carvedilol and Isordil.  She previously had transaminitis with another statin.  Check CMP today.  # PACs: # SVT:  # Anemia:   The increase in her symptoms is likely COVID-19 related.  We will add diltiazem 120 mg daily.  Continue carvedilol.  Check CMP, magnesium, and CBC today.  Baseline labs 02/2020 her anemia is worsening.  This is likely due to renal disease.  However make sure that there have been no major changes.  # ESRD: On PD and doing well.  She will try to pull more fluid.  # Hypotension: No longer having hypotension.  Midodrine has been discontinued.  # Morbid obesity:  Cardiac rehab when able.   Medication Adjustments/Labs and Tests Ordered: Current medicines are reviewed at length with the patient today.  Concerns regarding medicines are outlined above.   Tests Ordered: Orders Placed This Encounter  Procedures  . CBC with Differential/Platelet  . Comprehensive metabolic panel  . Magnesium  . EKG 12-Lead    Medication Changes: Meds ordered this encounter  Medications  . diltiazem (CARDIZEM CD) 120 MG 24 hr capsule    Sig: Take 1 capsule (120 mg total) by mouth daily.    Dispense:  90 capsule    Refill:  3    Follow Up: with Sharion Grieves C. Oval Linsey, MD, [facility] in 1 month  Signed, Skeet Latch, MD  02/28/2020 12:33 PM    [place]

## 2020-02-29 DIAGNOSIS — N2581 Secondary hyperparathyroidism of renal origin: Secondary | ICD-10-CM | POA: Diagnosis not present

## 2020-02-29 DIAGNOSIS — N186 End stage renal disease: Secondary | ICD-10-CM | POA: Diagnosis not present

## 2020-02-29 DIAGNOSIS — D509 Iron deficiency anemia, unspecified: Secondary | ICD-10-CM | POA: Diagnosis not present

## 2020-02-29 DIAGNOSIS — E1122 Type 2 diabetes mellitus with diabetic chronic kidney disease: Secondary | ICD-10-CM | POA: Diagnosis not present

## 2020-02-29 DIAGNOSIS — I132 Hypertensive heart and chronic kidney disease with heart failure and with stage 5 chronic kidney disease, or end stage renal disease: Secondary | ICD-10-CM | POA: Diagnosis not present

## 2020-02-29 DIAGNOSIS — I509 Heart failure, unspecified: Secondary | ICD-10-CM | POA: Diagnosis not present

## 2020-02-29 DIAGNOSIS — D631 Anemia in chronic kidney disease: Secondary | ICD-10-CM | POA: Diagnosis not present

## 2020-02-29 DIAGNOSIS — E8779 Other fluid overload: Secondary | ICD-10-CM | POA: Diagnosis not present

## 2020-02-29 DIAGNOSIS — Z992 Dependence on renal dialysis: Secondary | ICD-10-CM | POA: Diagnosis not present

## 2020-02-29 LAB — CBC WITH DIFFERENTIAL/PLATELET
Basophils Absolute: 0 10*3/uL (ref 0.0–0.2)
Basos: 0 %
EOS (ABSOLUTE): 0 10*3/uL (ref 0.0–0.4)
Eos: 1 %
Hematocrit: 25 % — ABNORMAL LOW (ref 34.0–46.6)
Hemoglobin: 8.1 g/dL — ABNORMAL LOW (ref 11.1–15.9)
Immature Grans (Abs): 0 10*3/uL (ref 0.0–0.1)
Immature Granulocytes: 0 %
Lymphocytes Absolute: 1.1 10*3/uL (ref 0.7–3.1)
Lymphs: 29 %
MCH: 31.8 pg (ref 26.6–33.0)
MCHC: 32.4 g/dL (ref 31.5–35.7)
MCV: 98 fL — ABNORMAL HIGH (ref 79–97)
Monocytes Absolute: 0.7 10*3/uL (ref 0.1–0.9)
Monocytes: 19 %
Neutrophils Absolute: 1.9 10*3/uL (ref 1.4–7.0)
Neutrophils: 51 %
Platelets: 218 10*3/uL (ref 150–450)
RBC: 2.55 x10E6/uL — CL (ref 3.77–5.28)
RDW: 15 % (ref 11.7–15.4)
WBC: 3.8 10*3/uL (ref 3.4–10.8)

## 2020-02-29 LAB — COMPREHENSIVE METABOLIC PANEL
ALT: 17 IU/L (ref 0–32)
AST: 17 IU/L (ref 0–40)
Albumin/Globulin Ratio: 1.2 (ref 1.2–2.2)
Albumin: 3.6 g/dL — ABNORMAL LOW (ref 3.8–4.9)
Alkaline Phosphatase: 85 IU/L (ref 44–121)
BUN/Creatinine Ratio: 4 — ABNORMAL LOW (ref 9–23)
BUN: 29 mg/dL — ABNORMAL HIGH (ref 6–24)
Bilirubin Total: 0.5 mg/dL (ref 0.0–1.2)
CO2: 26 mmol/L (ref 20–29)
Calcium: 9 mg/dL (ref 8.7–10.2)
Chloride: 95 mmol/L — ABNORMAL LOW (ref 96–106)
Creatinine, Ser: 7.67 mg/dL — ABNORMAL HIGH (ref 0.57–1.00)
GFR calc Af Amer: 6 mL/min/{1.73_m2} — ABNORMAL LOW (ref >59)
GFR calc non Af Amer: 5 mL/min/{1.73_m2} — ABNORMAL LOW (ref >59)
Globulin, Total: 2.9 g/dL (ref 1.5–4.5)
Glucose: 88 mg/dL (ref 65–99)
Potassium: 5.6 mmol/L — ABNORMAL HIGH (ref 3.5–5.2)
Sodium: 139 mmol/L (ref 134–144)
Total Protein: 6.5 g/dL (ref 6.0–8.5)

## 2020-02-29 LAB — MAGNESIUM: Magnesium: 2.2 mg/dL (ref 1.6–2.3)

## 2020-03-01 ENCOUNTER — Other Ambulatory Visit: Payer: Self-pay | Admitting: Internal Medicine

## 2020-03-01 DIAGNOSIS — E1122 Type 2 diabetes mellitus with diabetic chronic kidney disease: Secondary | ICD-10-CM | POA: Diagnosis not present

## 2020-03-01 DIAGNOSIS — N2581 Secondary hyperparathyroidism of renal origin: Secondary | ICD-10-CM | POA: Diagnosis not present

## 2020-03-01 DIAGNOSIS — E8779 Other fluid overload: Secondary | ICD-10-CM | POA: Diagnosis not present

## 2020-03-01 DIAGNOSIS — I509 Heart failure, unspecified: Secondary | ICD-10-CM | POA: Diagnosis not present

## 2020-03-01 DIAGNOSIS — N186 End stage renal disease: Secondary | ICD-10-CM | POA: Diagnosis not present

## 2020-03-01 DIAGNOSIS — I132 Hypertensive heart and chronic kidney disease with heart failure and with stage 5 chronic kidney disease, or end stage renal disease: Secondary | ICD-10-CM | POA: Diagnosis not present

## 2020-03-01 DIAGNOSIS — Z992 Dependence on renal dialysis: Secondary | ICD-10-CM | POA: Diagnosis not present

## 2020-03-01 DIAGNOSIS — E519 Thiamine deficiency, unspecified: Secondary | ICD-10-CM

## 2020-03-01 DIAGNOSIS — D631 Anemia in chronic kidney disease: Secondary | ICD-10-CM | POA: Diagnosis not present

## 2020-03-01 DIAGNOSIS — D509 Iron deficiency anemia, unspecified: Secondary | ICD-10-CM | POA: Diagnosis not present

## 2020-03-01 LAB — VITAMIN B1: Vitamin B1 (Thiamine): <6 nmol/L — ABNORMAL LOW (ref 8–30)

## 2020-03-01 MED ORDER — VITAMIN B-1 50 MG PO TABS: 50.0000 mg | ORAL_TABLET | Freq: Every day | ORAL | 1 refills | Status: AC

## 2020-03-04 DIAGNOSIS — N186 End stage renal disease: Secondary | ICD-10-CM | POA: Diagnosis not present

## 2020-03-04 DIAGNOSIS — N2581 Secondary hyperparathyroidism of renal origin: Secondary | ICD-10-CM | POA: Diagnosis not present

## 2020-03-04 DIAGNOSIS — I132 Hypertensive heart and chronic kidney disease with heart failure and with stage 5 chronic kidney disease, or end stage renal disease: Secondary | ICD-10-CM | POA: Diagnosis not present

## 2020-03-04 DIAGNOSIS — E8779 Other fluid overload: Secondary | ICD-10-CM | POA: Diagnosis not present

## 2020-03-04 DIAGNOSIS — D631 Anemia in chronic kidney disease: Secondary | ICD-10-CM | POA: Diagnosis not present

## 2020-03-04 DIAGNOSIS — I509 Heart failure, unspecified: Secondary | ICD-10-CM | POA: Diagnosis not present

## 2020-03-04 DIAGNOSIS — Z992 Dependence on renal dialysis: Secondary | ICD-10-CM | POA: Diagnosis not present

## 2020-03-04 DIAGNOSIS — D509 Iron deficiency anemia, unspecified: Secondary | ICD-10-CM | POA: Diagnosis not present

## 2020-03-04 DIAGNOSIS — E1122 Type 2 diabetes mellitus with diabetic chronic kidney disease: Secondary | ICD-10-CM | POA: Diagnosis not present

## 2020-03-05 DIAGNOSIS — I132 Hypertensive heart and chronic kidney disease with heart failure and with stage 5 chronic kidney disease, or end stage renal disease: Secondary | ICD-10-CM | POA: Diagnosis not present

## 2020-03-05 DIAGNOSIS — E8779 Other fluid overload: Secondary | ICD-10-CM | POA: Diagnosis not present

## 2020-03-05 DIAGNOSIS — Z992 Dependence on renal dialysis: Secondary | ICD-10-CM | POA: Diagnosis not present

## 2020-03-05 DIAGNOSIS — N2581 Secondary hyperparathyroidism of renal origin: Secondary | ICD-10-CM | POA: Diagnosis not present

## 2020-03-05 DIAGNOSIS — E1122 Type 2 diabetes mellitus with diabetic chronic kidney disease: Secondary | ICD-10-CM | POA: Diagnosis not present

## 2020-03-05 DIAGNOSIS — D631 Anemia in chronic kidney disease: Secondary | ICD-10-CM | POA: Diagnosis not present

## 2020-03-05 DIAGNOSIS — I509 Heart failure, unspecified: Secondary | ICD-10-CM | POA: Diagnosis not present

## 2020-03-05 DIAGNOSIS — D509 Iron deficiency anemia, unspecified: Secondary | ICD-10-CM | POA: Diagnosis not present

## 2020-03-05 DIAGNOSIS — N186 End stage renal disease: Secondary | ICD-10-CM | POA: Diagnosis not present

## 2020-03-07 DIAGNOSIS — I132 Hypertensive heart and chronic kidney disease with heart failure and with stage 5 chronic kidney disease, or end stage renal disease: Secondary | ICD-10-CM | POA: Diagnosis not present

## 2020-03-07 DIAGNOSIS — E8779 Other fluid overload: Secondary | ICD-10-CM | POA: Diagnosis not present

## 2020-03-07 DIAGNOSIS — E1122 Type 2 diabetes mellitus with diabetic chronic kidney disease: Secondary | ICD-10-CM | POA: Diagnosis not present

## 2020-03-07 DIAGNOSIS — D631 Anemia in chronic kidney disease: Secondary | ICD-10-CM | POA: Diagnosis not present

## 2020-03-07 DIAGNOSIS — I509 Heart failure, unspecified: Secondary | ICD-10-CM | POA: Diagnosis not present

## 2020-03-07 DIAGNOSIS — D509 Iron deficiency anemia, unspecified: Secondary | ICD-10-CM | POA: Diagnosis not present

## 2020-03-07 DIAGNOSIS — Z992 Dependence on renal dialysis: Secondary | ICD-10-CM | POA: Diagnosis not present

## 2020-03-07 DIAGNOSIS — N2581 Secondary hyperparathyroidism of renal origin: Secondary | ICD-10-CM | POA: Diagnosis not present

## 2020-03-07 DIAGNOSIS — N186 End stage renal disease: Secondary | ICD-10-CM | POA: Diagnosis not present

## 2020-03-08 DIAGNOSIS — I509 Heart failure, unspecified: Secondary | ICD-10-CM | POA: Diagnosis not present

## 2020-03-08 DIAGNOSIS — Z992 Dependence on renal dialysis: Secondary | ICD-10-CM | POA: Diagnosis not present

## 2020-03-08 DIAGNOSIS — I132 Hypertensive heart and chronic kidney disease with heart failure and with stage 5 chronic kidney disease, or end stage renal disease: Secondary | ICD-10-CM | POA: Diagnosis not present

## 2020-03-08 DIAGNOSIS — N2581 Secondary hyperparathyroidism of renal origin: Secondary | ICD-10-CM | POA: Diagnosis not present

## 2020-03-08 DIAGNOSIS — E8779 Other fluid overload: Secondary | ICD-10-CM | POA: Diagnosis not present

## 2020-03-08 DIAGNOSIS — N186 End stage renal disease: Secondary | ICD-10-CM | POA: Diagnosis not present

## 2020-03-08 DIAGNOSIS — D631 Anemia in chronic kidney disease: Secondary | ICD-10-CM | POA: Diagnosis not present

## 2020-03-08 DIAGNOSIS — E1122 Type 2 diabetes mellitus with diabetic chronic kidney disease: Secondary | ICD-10-CM | POA: Diagnosis not present

## 2020-03-08 DIAGNOSIS — D509 Iron deficiency anemia, unspecified: Secondary | ICD-10-CM | POA: Diagnosis not present

## 2020-03-11 DIAGNOSIS — E8779 Other fluid overload: Secondary | ICD-10-CM | POA: Diagnosis not present

## 2020-03-11 DIAGNOSIS — I509 Heart failure, unspecified: Secondary | ICD-10-CM | POA: Diagnosis not present

## 2020-03-11 DIAGNOSIS — I132 Hypertensive heart and chronic kidney disease with heart failure and with stage 5 chronic kidney disease, or end stage renal disease: Secondary | ICD-10-CM | POA: Diagnosis not present

## 2020-03-11 DIAGNOSIS — D509 Iron deficiency anemia, unspecified: Secondary | ICD-10-CM | POA: Diagnosis not present

## 2020-03-11 DIAGNOSIS — D631 Anemia in chronic kidney disease: Secondary | ICD-10-CM | POA: Diagnosis not present

## 2020-03-11 DIAGNOSIS — E1122 Type 2 diabetes mellitus with diabetic chronic kidney disease: Secondary | ICD-10-CM | POA: Diagnosis not present

## 2020-03-11 DIAGNOSIS — N186 End stage renal disease: Secondary | ICD-10-CM | POA: Diagnosis not present

## 2020-03-11 DIAGNOSIS — N2581 Secondary hyperparathyroidism of renal origin: Secondary | ICD-10-CM | POA: Diagnosis not present

## 2020-03-11 DIAGNOSIS — Z992 Dependence on renal dialysis: Secondary | ICD-10-CM | POA: Diagnosis not present

## 2020-03-12 DIAGNOSIS — E8779 Other fluid overload: Secondary | ICD-10-CM | POA: Diagnosis not present

## 2020-03-12 DIAGNOSIS — N186 End stage renal disease: Secondary | ICD-10-CM | POA: Diagnosis not present

## 2020-03-12 DIAGNOSIS — N2581 Secondary hyperparathyroidism of renal origin: Secondary | ICD-10-CM | POA: Diagnosis not present

## 2020-03-12 DIAGNOSIS — E1122 Type 2 diabetes mellitus with diabetic chronic kidney disease: Secondary | ICD-10-CM | POA: Diagnosis not present

## 2020-03-12 DIAGNOSIS — I509 Heart failure, unspecified: Secondary | ICD-10-CM | POA: Diagnosis not present

## 2020-03-12 DIAGNOSIS — I132 Hypertensive heart and chronic kidney disease with heart failure and with stage 5 chronic kidney disease, or end stage renal disease: Secondary | ICD-10-CM | POA: Diagnosis not present

## 2020-03-12 DIAGNOSIS — D631 Anemia in chronic kidney disease: Secondary | ICD-10-CM | POA: Diagnosis not present

## 2020-03-12 DIAGNOSIS — D509 Iron deficiency anemia, unspecified: Secondary | ICD-10-CM | POA: Diagnosis not present

## 2020-03-12 DIAGNOSIS — Z992 Dependence on renal dialysis: Secondary | ICD-10-CM | POA: Diagnosis not present

## 2020-03-14 ENCOUNTER — Ambulatory Visit: Payer: Medicare HMO | Admitting: Thoracic Surgery (Cardiothoracic Vascular Surgery)

## 2020-03-14 DIAGNOSIS — Z992 Dependence on renal dialysis: Secondary | ICD-10-CM | POA: Diagnosis not present

## 2020-03-14 DIAGNOSIS — E1122 Type 2 diabetes mellitus with diabetic chronic kidney disease: Secondary | ICD-10-CM | POA: Diagnosis not present

## 2020-03-14 DIAGNOSIS — N2581 Secondary hyperparathyroidism of renal origin: Secondary | ICD-10-CM | POA: Diagnosis not present

## 2020-03-14 DIAGNOSIS — D631 Anemia in chronic kidney disease: Secondary | ICD-10-CM | POA: Diagnosis not present

## 2020-03-14 DIAGNOSIS — N186 End stage renal disease: Secondary | ICD-10-CM | POA: Diagnosis not present

## 2020-03-14 DIAGNOSIS — I509 Heart failure, unspecified: Secondary | ICD-10-CM | POA: Diagnosis not present

## 2020-03-14 DIAGNOSIS — D509 Iron deficiency anemia, unspecified: Secondary | ICD-10-CM | POA: Diagnosis not present

## 2020-03-14 DIAGNOSIS — E8779 Other fluid overload: Secondary | ICD-10-CM | POA: Diagnosis not present

## 2020-03-14 DIAGNOSIS — I132 Hypertensive heart and chronic kidney disease with heart failure and with stage 5 chronic kidney disease, or end stage renal disease: Secondary | ICD-10-CM | POA: Diagnosis not present

## 2020-03-15 DIAGNOSIS — E1122 Type 2 diabetes mellitus with diabetic chronic kidney disease: Secondary | ICD-10-CM | POA: Diagnosis not present

## 2020-03-15 DIAGNOSIS — N950 Postmenopausal bleeding: Secondary | ICD-10-CM | POA: Diagnosis not present

## 2020-03-15 DIAGNOSIS — N186 End stage renal disease: Secondary | ICD-10-CM | POA: Diagnosis not present

## 2020-03-15 DIAGNOSIS — E8779 Other fluid overload: Secondary | ICD-10-CM | POA: Diagnosis not present

## 2020-03-15 DIAGNOSIS — I509 Heart failure, unspecified: Secondary | ICD-10-CM | POA: Diagnosis not present

## 2020-03-15 DIAGNOSIS — N6320 Unspecified lump in the left breast, unspecified quadrant: Secondary | ICD-10-CM | POA: Diagnosis not present

## 2020-03-15 DIAGNOSIS — Z992 Dependence on renal dialysis: Secondary | ICD-10-CM | POA: Diagnosis not present

## 2020-03-15 DIAGNOSIS — N2581 Secondary hyperparathyroidism of renal origin: Secondary | ICD-10-CM | POA: Diagnosis not present

## 2020-03-15 DIAGNOSIS — D631 Anemia in chronic kidney disease: Secondary | ICD-10-CM | POA: Diagnosis not present

## 2020-03-15 DIAGNOSIS — I132 Hypertensive heart and chronic kidney disease with heart failure and with stage 5 chronic kidney disease, or end stage renal disease: Secondary | ICD-10-CM | POA: Diagnosis not present

## 2020-03-15 DIAGNOSIS — D509 Iron deficiency anemia, unspecified: Secondary | ICD-10-CM | POA: Diagnosis not present

## 2020-03-16 ENCOUNTER — Telehealth (INDEPENDENT_AMBULATORY_CARE_PROVIDER_SITE_OTHER): Payer: Medicare HMO | Admitting: Family Medicine

## 2020-03-16 VITALS — BP 151/95 | HR 71

## 2020-03-16 DIAGNOSIS — R0602 Shortness of breath: Secondary | ICD-10-CM | POA: Diagnosis not present

## 2020-03-16 MED ORDER — ALBUTEROL SULFATE HFA 108 (90 BASE) MCG/ACT IN AERS: 2.0000 | INHALATION_SPRAY | Freq: Four times a day (QID) | RESPIRATORY_TRACT | 1 refills | Status: DC | PRN

## 2020-03-16 NOTE — Progress Notes (Signed)
[facility] [address], [place], [place] [address] [phone]  Date:  03/16/2020   Name:  Brenda Davis   DOB:  1965/11/28   MRN:  [id number]  PCP:  [name], MD    Chief Complaint: Cough (Onset: 2 weeks , dry cough)   History of Present Illness:  Brenda Davis is a 55 y.o. very pleasant female patient who presents with the following:   Patient location is home, provider is at home.  Patient identity is confirmed with 2 factors, she gives consent for virtual visit today.  The patient and myself are present on the call  Patient with history of CAD and NSTEMI June 2020 again December 2020, hypertension, asthma, cirrhosis, diabetes, end-stage renal disease on peritoneal dialysis On chart review, she had COVID-19 about 1 year ago- positive test on chart 03/09/19 She also apparently tested positive in January of this year - seen at the ER on 1/24 Seen virtually during Saturday clinic with concern of illness Today pt notes that she is feeling fatigued- walking even a short distance will make her "winded", she will need to rest for a while She will then have a cough  She has noted these sx for about 3 weeks now  She had hoped this would go away but it has not She has chronic anemia due to her CRI- she reports getting iron infusion twice recently Most recent Hg was 8.1 They will check her labs again this coming Tuesday  No fever or chills No chest pain  She notes "a dry, irritating cough"  Her sx may be worse at night  She does have orthopnea  She is checking her weights and she is not up from baseline - her dry weight is actually down a bit   COVID-19 vaccination She reports she was seen by her OBG yesterday and her sats were 97%  She reports that her OBG reported her lungs were clear No LE edema  She does dialysis every 48 hours- home hemodialysis, she is not on PD any longer due to  infection  Patient Active Problem List   Diagnosis Date Noted  . SVT (supraventricular tachycardia) ([place]) 02/28/2020  . Hypertension 02/23/2020  . Diabetic neuropathy, painful ([place]) 02/23/2020  . Anemia due to acquired thiamine deficiency 04/10/2019  . Deficiency anemia 04/04/2019  . Unstable angina ([place]) 03/03/2019  . Increased anion gap metabolic acidosis 99/24/2683  . Hyperkalemia   . Coronary artery disease involving native coronary artery of native heart with unstable angina pectoris ([place])   . Hypocalcemia 06/18/2018  . Localized swelling of finger of left hand 06/01/2018  . Folate deficiency 03/08/2018  . NSTEMI (non-ST elevated myocardial infarction) ([place])   . CAD in native artery   . Hepatic cirrhosis ([place]) 11/24/2017  . DM (diabetes mellitus), type 2 with renal complications ([place]) 41/96/2229  . Chronic heart failure with preserved ejection fraction ([place]) 06/22/2017  . Sensorineural hearing loss (SNHL), bilateral 06/29/2016  . Allergic rhinitis 06/27/2016  . Severe persistent asthma with exacerbation 05/12/2016  . Anemia of chronic disease 05/06/2016  . Routine general medical examination at a health care facility 12/05/2015  . Cervical cancer screening 12/05/2015  . Visit for screening mammogram 01/23/2015  . Gout due to renal impairment 09/13/2014  . Thiamine deficiency 12/02/2012  . Hypokalemia 11/28/2012  . S/P laparoscopic sleeve gastrectomy 11/28/2012  . B12 deficiency anemia 11/28/2012  . Morbid obesity with BMI  of 40.0-44.9, adult ([place]) 07/22/2012  . Dyslipidemia, goal LDL below 70 04/09/2008  . CKD (chronic kidney disease) stage V requiring chronic dialysis (MWF) 02/08/2008    Past Medical History:  Diagnosis Date  . Anemia   . CAD (coronary artery disease)    a. cath in 02/2018 showing moderate 3-vessel CAD with 45% mid-LADm 55% LCx and 65% RCA stenosis which was not significant by FFR  . ESRD on dialysis ([place])   . Gout   . HCAP (healthcare-associated  pneumonia) 05/06/2016  . History of blood transfusion    "related to surgery"  . Hyperlipidemia   . Hypertension   . Morbid obesity ([place])   . Pain    LEFT SHOULDER PAIN - WAS SEEN AT AN URGENT CARE - GIVEN SLING FOR COMFORT AND TOLD ROM AS TOLERATED.  Marland Kitchen Palpitations 09/24/2016  . Peritonitis, dialysis-associated ([place])   . SVT (supraventricular tachycardia) ([place]) 02/28/2020  . Type II diabetes mellitus ([place])    "gastric sleeve OR corrected this" (05/06/2016)    Past Surgical History:  Procedure Laterality Date  . AV FISTULA PLACEMENT Left 01/03/2015   Procedure: BRACHIAL CEPHALIC ARTERIOVENOUS  FISTULA CREATION LEFT ARM;  Surgeon: Angelia Mould, MD;  Location: [facility];  Service: Vascular;  Laterality: Left;  . CARPAL TUNNEL RELEASE Right   . [place]  . CHOLECYSTECTOMY  09/14/2017  . CORONARY STENT INTERVENTION N/A 01/17/2019   Procedure: CORONARY STENT INTERVENTION;  Surgeon: Jettie Booze, MD;  Location: [facility];  Service: Cardiovascular;  Laterality: N/A;  . ESOPHAGOGASTRODUODENOSCOPY N/A 09/04/2012   Procedure: ESOPHAGOGASTRODUODENOSCOPY (EGD);  Surgeon: Shann Medal, MD;  Location[facility];  Service: General;  Laterality: N/A;  PF  . ESOPHAGOGASTRODUODENOSCOPY (EGD) WITH ESOPHAGEAL DILATION N/A 09/29/2012   Procedure: ESOPHAGOGASTRODUODENOSCOPY (EGD) WITH ESOPHAGEAL DILATION;  Surgeon: Milus Banister, MD;  Location: [facility];  Service: Endoscopy;  Laterality: N/A;  . FEMORAL ARTERY EXPLORATION Right 01/17/2019   Procedure: Exploration Right Groin with Primary Closure or Arteriotomy Site; Washout of Retroperitoneal Hematoma;  Surgeon: Waynetta Sandy, MD;  Location: [facility];  Service: Vascular;  Laterality: Right;  . INSERTION OF DIALYSIS CATHETER N/A 01/03/2015   Procedure: INSERTION OF DIALYSIS CATHETER RIGHT INTERNAL JUGULAR;  Surgeon: Angelia Mould, MD;  Location: [facility];  Service: Vascular;  Laterality: N/A;  .  LAPAROSCOPIC GASTRIC SLEEVE RESECTION N/A 07/19/2012   Procedure: LAPAROSCOPIC SLEEVE GASTRECTOMY with EGD;  Surgeon: Madilyn Hook, DO;  Location: [facility];  Service: General;  Laterality: N/A;  laparoscopic sleeve gastrectomy with EGD  . LEFT HEART CATH AND CORONARY ANGIOGRAPHY N/A 02/25/2018   Procedure: LEFT HEART CATH AND CORONARY ANGIOGRAPHY;  Surgeon: Leonie Man, MD;  Location: [facility];  Service: Cardiovascular;  Laterality: N/A;  . LEFT HEART CATH AND CORONARY ANGIOGRAPHY N/A 06/20/2018   Procedure: LEFT HEART CATH AND CORONARY ANGIOGRAPHY;  Surgeon: Nelva Bush, MD;  Location: [facility];  Service: Cardiovascular;  Laterality: N/A;  . LEFT HEART CATH AND CORONARY ANGIOGRAPHY N/A 01/16/2019   Procedure: LEFT HEART CATH AND CORONARY ANGIOGRAPHY;  Surgeon: Jettie Booze, MD;  Location: [facility];  Service: Cardiovascular;  Laterality: N/A;  . LEFT HEART CATH AND CORONARY ANGIOGRAPHY N/A 03/03/2019   Procedure: LEFT HEART CATH AND CORONARY ANGIOGRAPHY;  Surgeon: Jettie Booze, MD;  Location: [facility];  Service: Cardiovascular;  Laterality: N/A;  . PERIPHERAL VASCULAR BALLOON ANGIOPLASTY Left 08/18/2019   Procedure: PERIPHERAL VASCULAR  BALLOON ANGIOPLASTY;  Surgeon: Marty Heck, MD;  Location: [facility];  Service: Cardiovascular;  Laterality: Left;  arm fistula  . PERIPHERAL VASCULAR CATHETERIZATION Left 05/23/2015   Procedure: Nolon Stalls;  Surgeon: Conrad Bald[place], MD;  Location: [facility];  Service: Cardiovascular;  Laterality: Left;  upper aRM  . PERIPHERAL VASCULAR CATHETERIZATION Left 05/23/2015   Procedure: Peripheral Vascular Balloon Angioplasty;  Surgeon: Conrad [place], MD;  Location: [facility];  Service: Cardiovascular;  Laterality: Left;  av fistula  . TUBAL LIGATION  1993  . UPPER GI ENDOSCOPY N/A 07/19/2012   Procedure: UPPER GI ENDOSCOPY;  Surgeon: Madilyn Hook, DO;  [facility];  Service: General;   Laterality: N/A;    Social History   Tobacco Use  . Smoking status: Former Smoker    Packs/day: 1.00    Years: 16.00    Pack years: 16.00    Types: Cigarettes    Quit date: 2009    Years since quitting: 13.1  . Smokeless tobacco: Never Used  Vaping Use  . Vaping Use: Never used  Substance Use Topics  . Alcohol use: Never  . Drug use: No    Family History  Problem Relation Age of Onset  . Hypertension Mother   . Mitral valve prolapse Mother   . Diabetes Father   . Arthritis Other   . Hyperlipidemia Other   . Cancer Neg Hx   . Heart disease Neg Hx   . Kidney disease Neg Hx   . Stroke Neg Hx     Allergies  Allergen Reactions  . Amlodipine Swelling  . Atorvastatin     Elevated LFT's  . Clonidine Derivatives Swelling    Limbs swell  . Doxycycline Nausea And Vomiting    "I threw up for 3 hours"  . Welchol [Colesevelam Hcl] Nausea Only    Medication list has been reviewed and updated.  Current Outpatient Medications on File Prior to Visit  Medication Sig Dispense Refill  . acetaminophen (TYLENOL) 500 MG tablet Take 1,000 mg by mouth every 6 (six) hours as needed for moderate pain or headache.    . B Complex-C-Biotin-D-FA (DIALYVITE 800 PLUS D) 800 MCG WAFR Take by mouth.    . calcitRIOL (ROCALTROL) 0.5 MCG capsule Take 1.5 mcg by mouth daily.     . calcium acetate (PHOSLO) 667 MG capsule Take 667-2,001 mg by mouth See admin instructions. Take 2001 mg with each meal and 667 mg with each snack    . carvedilol (COREG) 25 MG tablet Take 25 mg by mouth 2 (two) times daily.    Marland Kitchen diltiazem (CARDIZEM CD) 120 MG 24 hr capsule Take 1 capsule (120 mg total) by mouth daily. 90 capsule 3  . ferric citrate (AURYXIA) 1 GM 210 MG(Fe) tablet Take 2 tablets (420 mg total) by mouth 3 (three) times daily with meals. 270 tablet 0  . gabapentin (NEURONTIN) 100 MG capsule Take 100 mg by mouth 2 (two) times daily.    . isosorbide dinitrate (ISORDIL) 20 MG tablet Take 1 tablet (20 mg  total) by mouth 2 (two) times daily. 180 tablet 3  . mupirocin ointment (BACTROBAN) 2 % Apply topically.    . nitroGLYCERIN (NITROSTAT) 0.4 MG SL tablet TAKE 1 TABLET BY MOUTH UNDER THE TONGUE EVERY 5 MINUTES AS NEEDED FOR CHEST PAIN (Patient taking differently: Place 0.4 mg under the tongue every 5 (five) minutes as needed for chest pain.) 50 tablet 2  . ondansetron (ZOFRAN) 4  MG tablet Take 1 tablet (4 mg total) by mouth every 8 (eight) hours as needed for nausea or vomiting. 20 tablet 0  . oxyCODONE (OXY IR/ROXICODONE) 5 MG immediate release tablet Take 1 tablet (5 mg total) by mouth every 6 (six) hours as needed for severe pain. 90 tablet 0  . rosuvastatin (CRESTOR) 20 MG tablet Take 1 tablet (20 mg total) by mouth daily. 90 tablet 3  . thiamine (VITAMIN B-1) 50 MG tablet Take 1 tablet (50 mg total) by mouth daily. 90 tablet 1   No current facility-administered medications on file prior to visit.    Review of Systems:  As per HPI- otherwise negative.   Physical Examination: Vitals:   03/16/20 0851  BP: (!) 151/95  Pulse: 71   There were no vitals filed for this visit. There is no height or weight on file to calculate BMI. Ideal Body Weight:    Pt viewed over video- she looks well, no cough or distress noted   Assessment and Plan: Shortness of breath - Plan: albuterol (VENTOLIN HFA) 108 (90 Base) MCG/ACT inhaler  Patient with complex medical history, virtual visit today to discuss shortness of breath for several weeks.  The patient has noticed stable shortness of breath since she had COVID-19 for a second time about 1 month ago.  She denies any chest pain, she does notice orthopnea but her weight is stable and no ankle edema.  Explained to patient that evaluation is limited by virtual format.  Prescribed albuterol for her to use her to use as needed for shortness of breath.  Advised her that if her symptoms persist despite this, she needs to be seen in person next week.  She  states agreement.  If she does get better, she plans to see her PCP in 1 month Patient states understanding and agreement with plan.  Video used for entirety of visit today  Signed Lamar Blinks, MD

## 2020-03-18 ENCOUNTER — Telehealth: Payer: Medicare HMO | Admitting: Cardiovascular Disease

## 2020-03-18 DIAGNOSIS — Z992 Dependence on renal dialysis: Secondary | ICD-10-CM | POA: Diagnosis not present

## 2020-03-18 DIAGNOSIS — E1122 Type 2 diabetes mellitus with diabetic chronic kidney disease: Secondary | ICD-10-CM | POA: Diagnosis not present

## 2020-03-18 DIAGNOSIS — N186 End stage renal disease: Secondary | ICD-10-CM | POA: Diagnosis not present

## 2020-03-19 ENCOUNTER — Other Ambulatory Visit: Payer: Self-pay | Admitting: Obstetrics and Gynecology

## 2020-03-19 DIAGNOSIS — D689 Coagulation defect, unspecified: Secondary | ICD-10-CM | POA: Diagnosis not present

## 2020-03-19 DIAGNOSIS — E8779 Other fluid overload: Secondary | ICD-10-CM | POA: Diagnosis not present

## 2020-03-19 DIAGNOSIS — N6320 Unspecified lump in the left breast, unspecified quadrant: Secondary | ICD-10-CM

## 2020-03-19 DIAGNOSIS — D631 Anemia in chronic kidney disease: Secondary | ICD-10-CM | POA: Diagnosis not present

## 2020-03-19 DIAGNOSIS — D509 Iron deficiency anemia, unspecified: Secondary | ICD-10-CM | POA: Diagnosis not present

## 2020-03-19 DIAGNOSIS — N186 End stage renal disease: Secondary | ICD-10-CM | POA: Diagnosis not present

## 2020-03-19 DIAGNOSIS — Z992 Dependence on renal dialysis: Secondary | ICD-10-CM | POA: Diagnosis not present

## 2020-03-19 DIAGNOSIS — E1122 Type 2 diabetes mellitus with diabetic chronic kidney disease: Secondary | ICD-10-CM | POA: Diagnosis not present

## 2020-03-19 DIAGNOSIS — I509 Heart failure, unspecified: Secondary | ICD-10-CM | POA: Diagnosis not present

## 2020-03-19 DIAGNOSIS — I132 Hypertensive heart and chronic kidney disease with heart failure and with stage 5 chronic kidney disease, or end stage renal disease: Secondary | ICD-10-CM | POA: Diagnosis not present

## 2020-03-19 DIAGNOSIS — N2581 Secondary hyperparathyroidism of renal origin: Secondary | ICD-10-CM | POA: Diagnosis not present

## 2020-03-20 DIAGNOSIS — I132 Hypertensive heart and chronic kidney disease with heart failure and with stage 5 chronic kidney disease, or end stage renal disease: Secondary | ICD-10-CM | POA: Diagnosis not present

## 2020-03-20 DIAGNOSIS — D689 Coagulation defect, unspecified: Secondary | ICD-10-CM | POA: Diagnosis not present

## 2020-03-20 DIAGNOSIS — E1122 Type 2 diabetes mellitus with diabetic chronic kidney disease: Secondary | ICD-10-CM | POA: Diagnosis not present

## 2020-03-20 DIAGNOSIS — D631 Anemia in chronic kidney disease: Secondary | ICD-10-CM | POA: Diagnosis not present

## 2020-03-20 DIAGNOSIS — E8779 Other fluid overload: Secondary | ICD-10-CM | POA: Diagnosis not present

## 2020-03-20 DIAGNOSIS — N186 End stage renal disease: Secondary | ICD-10-CM | POA: Diagnosis not present

## 2020-03-20 DIAGNOSIS — N2581 Secondary hyperparathyroidism of renal origin: Secondary | ICD-10-CM | POA: Diagnosis not present

## 2020-03-20 DIAGNOSIS — I509 Heart failure, unspecified: Secondary | ICD-10-CM | POA: Diagnosis not present

## 2020-03-20 DIAGNOSIS — Z992 Dependence on renal dialysis: Secondary | ICD-10-CM | POA: Diagnosis not present

## 2020-03-20 DIAGNOSIS — D509 Iron deficiency anemia, unspecified: Secondary | ICD-10-CM | POA: Diagnosis not present

## 2020-03-22 DIAGNOSIS — I132 Hypertensive heart and chronic kidney disease with heart failure and with stage 5 chronic kidney disease, or end stage renal disease: Secondary | ICD-10-CM | POA: Diagnosis not present

## 2020-03-22 DIAGNOSIS — I509 Heart failure, unspecified: Secondary | ICD-10-CM | POA: Diagnosis not present

## 2020-03-22 DIAGNOSIS — N186 End stage renal disease: Secondary | ICD-10-CM | POA: Diagnosis not present

## 2020-03-22 DIAGNOSIS — E8779 Other fluid overload: Secondary | ICD-10-CM | POA: Diagnosis not present

## 2020-03-22 DIAGNOSIS — D631 Anemia in chronic kidney disease: Secondary | ICD-10-CM | POA: Diagnosis not present

## 2020-03-22 DIAGNOSIS — N950 Postmenopausal bleeding: Secondary | ICD-10-CM | POA: Diagnosis not present

## 2020-03-22 DIAGNOSIS — N2581 Secondary hyperparathyroidism of renal origin: Secondary | ICD-10-CM | POA: Diagnosis not present

## 2020-03-22 DIAGNOSIS — D509 Iron deficiency anemia, unspecified: Secondary | ICD-10-CM | POA: Diagnosis not present

## 2020-03-22 DIAGNOSIS — Z992 Dependence on renal dialysis: Secondary | ICD-10-CM | POA: Diagnosis not present

## 2020-03-22 DIAGNOSIS — E1122 Type 2 diabetes mellitus with diabetic chronic kidney disease: Secondary | ICD-10-CM | POA: Diagnosis not present

## 2020-03-23 DIAGNOSIS — Z992 Dependence on renal dialysis: Secondary | ICD-10-CM | POA: Diagnosis not present

## 2020-03-23 DIAGNOSIS — E1122 Type 2 diabetes mellitus with diabetic chronic kidney disease: Secondary | ICD-10-CM | POA: Diagnosis not present

## 2020-03-23 DIAGNOSIS — D689 Coagulation defect, unspecified: Secondary | ICD-10-CM | POA: Diagnosis not present

## 2020-03-23 DIAGNOSIS — N186 End stage renal disease: Secondary | ICD-10-CM | POA: Diagnosis not present

## 2020-03-23 DIAGNOSIS — I509 Heart failure, unspecified: Secondary | ICD-10-CM | POA: Diagnosis not present

## 2020-03-23 DIAGNOSIS — N2581 Secondary hyperparathyroidism of renal origin: Secondary | ICD-10-CM | POA: Diagnosis not present

## 2020-03-23 DIAGNOSIS — E8779 Other fluid overload: Secondary | ICD-10-CM | POA: Diagnosis not present

## 2020-03-23 DIAGNOSIS — D631 Anemia in chronic kidney disease: Secondary | ICD-10-CM | POA: Diagnosis not present

## 2020-03-23 DIAGNOSIS — I132 Hypertensive heart and chronic kidney disease with heart failure and with stage 5 chronic kidney disease, or end stage renal disease: Secondary | ICD-10-CM | POA: Diagnosis not present

## 2020-03-25 DIAGNOSIS — N2581 Secondary hyperparathyroidism of renal origin: Secondary | ICD-10-CM | POA: Diagnosis not present

## 2020-03-25 DIAGNOSIS — Z992 Dependence on renal dialysis: Secondary | ICD-10-CM | POA: Diagnosis not present

## 2020-03-25 DIAGNOSIS — N186 End stage renal disease: Secondary | ICD-10-CM | POA: Diagnosis not present

## 2020-03-25 DIAGNOSIS — D689 Coagulation defect, unspecified: Secondary | ICD-10-CM | POA: Diagnosis not present

## 2020-03-25 DIAGNOSIS — I132 Hypertensive heart and chronic kidney disease with heart failure and with stage 5 chronic kidney disease, or end stage renal disease: Secondary | ICD-10-CM | POA: Diagnosis not present

## 2020-03-25 DIAGNOSIS — D509 Iron deficiency anemia, unspecified: Secondary | ICD-10-CM | POA: Diagnosis not present

## 2020-03-25 DIAGNOSIS — E1122 Type 2 diabetes mellitus with diabetic chronic kidney disease: Secondary | ICD-10-CM | POA: Diagnosis not present

## 2020-03-25 DIAGNOSIS — D631 Anemia in chronic kidney disease: Secondary | ICD-10-CM | POA: Diagnosis not present

## 2020-03-25 DIAGNOSIS — I509 Heart failure, unspecified: Secondary | ICD-10-CM | POA: Diagnosis not present

## 2020-03-25 DIAGNOSIS — E8779 Other fluid overload: Secondary | ICD-10-CM | POA: Diagnosis not present

## 2020-03-26 DIAGNOSIS — Z992 Dependence on renal dialysis: Secondary | ICD-10-CM | POA: Diagnosis not present

## 2020-03-26 DIAGNOSIS — N186 End stage renal disease: Secondary | ICD-10-CM | POA: Diagnosis not present

## 2020-03-26 DIAGNOSIS — D631 Anemia in chronic kidney disease: Secondary | ICD-10-CM | POA: Diagnosis not present

## 2020-03-26 DIAGNOSIS — E1122 Type 2 diabetes mellitus with diabetic chronic kidney disease: Secondary | ICD-10-CM | POA: Diagnosis not present

## 2020-03-26 DIAGNOSIS — D689 Coagulation defect, unspecified: Secondary | ICD-10-CM | POA: Diagnosis not present

## 2020-03-26 DIAGNOSIS — N2581 Secondary hyperparathyroidism of renal origin: Secondary | ICD-10-CM | POA: Diagnosis not present

## 2020-03-26 DIAGNOSIS — I132 Hypertensive heart and chronic kidney disease with heart failure and with stage 5 chronic kidney disease, or end stage renal disease: Secondary | ICD-10-CM | POA: Diagnosis not present

## 2020-03-26 DIAGNOSIS — D509 Iron deficiency anemia, unspecified: Secondary | ICD-10-CM | POA: Diagnosis not present

## 2020-03-26 DIAGNOSIS — I509 Heart failure, unspecified: Secondary | ICD-10-CM | POA: Diagnosis not present

## 2020-03-26 DIAGNOSIS — E8779 Other fluid overload: Secondary | ICD-10-CM | POA: Diagnosis not present

## 2020-03-27 DIAGNOSIS — N6321 Unspecified lump in the left breast, upper outer quadrant: Secondary | ICD-10-CM | POA: Diagnosis not present

## 2020-03-28 DIAGNOSIS — D631 Anemia in chronic kidney disease: Secondary | ICD-10-CM | POA: Diagnosis not present

## 2020-03-28 DIAGNOSIS — Z992 Dependence on renal dialysis: Secondary | ICD-10-CM | POA: Diagnosis not present

## 2020-03-28 DIAGNOSIS — D689 Coagulation defect, unspecified: Secondary | ICD-10-CM | POA: Diagnosis not present

## 2020-03-28 DIAGNOSIS — I509 Heart failure, unspecified: Secondary | ICD-10-CM | POA: Diagnosis not present

## 2020-03-28 DIAGNOSIS — E8779 Other fluid overload: Secondary | ICD-10-CM | POA: Diagnosis not present

## 2020-03-28 DIAGNOSIS — I132 Hypertensive heart and chronic kidney disease with heart failure and with stage 5 chronic kidney disease, or end stage renal disease: Secondary | ICD-10-CM | POA: Diagnosis not present

## 2020-03-28 DIAGNOSIS — N186 End stage renal disease: Secondary | ICD-10-CM | POA: Diagnosis not present

## 2020-03-28 DIAGNOSIS — N2581 Secondary hyperparathyroidism of renal origin: Secondary | ICD-10-CM | POA: Diagnosis not present

## 2020-03-28 DIAGNOSIS — E1122 Type 2 diabetes mellitus with diabetic chronic kidney disease: Secondary | ICD-10-CM | POA: Diagnosis not present

## 2020-03-28 DIAGNOSIS — D509 Iron deficiency anemia, unspecified: Secondary | ICD-10-CM | POA: Diagnosis not present

## 2020-03-29 ENCOUNTER — Telehealth: Payer: Self-pay | Admitting: Internal Medicine

## 2020-03-29 DIAGNOSIS — E1122 Type 2 diabetes mellitus with diabetic chronic kidney disease: Secondary | ICD-10-CM | POA: Diagnosis not present

## 2020-03-29 DIAGNOSIS — Z992 Dependence on renal dialysis: Secondary | ICD-10-CM | POA: Diagnosis not present

## 2020-03-29 DIAGNOSIS — N2581 Secondary hyperparathyroidism of renal origin: Secondary | ICD-10-CM | POA: Diagnosis not present

## 2020-03-29 DIAGNOSIS — D509 Iron deficiency anemia, unspecified: Secondary | ICD-10-CM | POA: Diagnosis not present

## 2020-03-29 DIAGNOSIS — E8779 Other fluid overload: Secondary | ICD-10-CM | POA: Diagnosis not present

## 2020-03-29 DIAGNOSIS — I132 Hypertensive heart and chronic kidney disease with heart failure and with stage 5 chronic kidney disease, or end stage renal disease: Secondary | ICD-10-CM | POA: Diagnosis not present

## 2020-03-29 DIAGNOSIS — N186 End stage renal disease: Secondary | ICD-10-CM | POA: Diagnosis not present

## 2020-03-29 DIAGNOSIS — I509 Heart failure, unspecified: Secondary | ICD-10-CM | POA: Diagnosis not present

## 2020-03-29 DIAGNOSIS — D631 Anemia in chronic kidney disease: Secondary | ICD-10-CM | POA: Diagnosis not present

## 2020-03-29 DIAGNOSIS — D689 Coagulation defect, unspecified: Secondary | ICD-10-CM | POA: Diagnosis not present

## 2020-03-29 NOTE — Telephone Encounter (Signed)
Patient called and was wondering if a sleep apnea study could be set up. She said that she preferred an at home study.  Please advise. Last OV 2.4.22. She can be reached at [phone]

## 2020-03-30 ENCOUNTER — Other Ambulatory Visit: Payer: Self-pay | Admitting: Internal Medicine

## 2020-03-30 DIAGNOSIS — R0683 Snoring: Secondary | ICD-10-CM

## 2020-04-01 DIAGNOSIS — E8779 Other fluid overload: Secondary | ICD-10-CM | POA: Diagnosis not present

## 2020-04-01 DIAGNOSIS — Z992 Dependence on renal dialysis: Secondary | ICD-10-CM | POA: Diagnosis not present

## 2020-04-01 DIAGNOSIS — I509 Heart failure, unspecified: Secondary | ICD-10-CM | POA: Diagnosis not present

## 2020-04-01 DIAGNOSIS — I132 Hypertensive heart and chronic kidney disease with heart failure and with stage 5 chronic kidney disease, or end stage renal disease: Secondary | ICD-10-CM | POA: Diagnosis not present

## 2020-04-01 DIAGNOSIS — E1122 Type 2 diabetes mellitus with diabetic chronic kidney disease: Secondary | ICD-10-CM | POA: Diagnosis not present

## 2020-04-01 DIAGNOSIS — N2581 Secondary hyperparathyroidism of renal origin: Secondary | ICD-10-CM | POA: Diagnosis not present

## 2020-04-01 DIAGNOSIS — D689 Coagulation defect, unspecified: Secondary | ICD-10-CM | POA: Diagnosis not present

## 2020-04-01 DIAGNOSIS — D631 Anemia in chronic kidney disease: Secondary | ICD-10-CM | POA: Diagnosis not present

## 2020-04-01 DIAGNOSIS — D509 Iron deficiency anemia, unspecified: Secondary | ICD-10-CM | POA: Diagnosis not present

## 2020-04-01 DIAGNOSIS — N186 End stage renal disease: Secondary | ICD-10-CM | POA: Diagnosis not present

## 2020-04-01 NOTE — Telephone Encounter (Signed)
Follow up message  Patient requesting order for sleep study, also requesting referral to Pulmonologist . She feels COVID may have damaged her lungs. She states she feels sometimes when taking a deep breath, she begins to cough

## 2020-04-01 NOTE — Telephone Encounter (Signed)
Pt has been informed sleep study referral was placed but wanted to know if one for pulmonary can be put in also. Please advise.

## 2020-04-02 DIAGNOSIS — D509 Iron deficiency anemia, unspecified: Secondary | ICD-10-CM | POA: Diagnosis not present

## 2020-04-02 DIAGNOSIS — I509 Heart failure, unspecified: Secondary | ICD-10-CM | POA: Diagnosis not present

## 2020-04-02 DIAGNOSIS — E8779 Other fluid overload: Secondary | ICD-10-CM | POA: Diagnosis not present

## 2020-04-02 DIAGNOSIS — D631 Anemia in chronic kidney disease: Secondary | ICD-10-CM | POA: Diagnosis not present

## 2020-04-02 DIAGNOSIS — Z992 Dependence on renal dialysis: Secondary | ICD-10-CM | POA: Diagnosis not present

## 2020-04-02 DIAGNOSIS — I132 Hypertensive heart and chronic kidney disease with heart failure and with stage 5 chronic kidney disease, or end stage renal disease: Secondary | ICD-10-CM | POA: Diagnosis not present

## 2020-04-02 DIAGNOSIS — N186 End stage renal disease: Secondary | ICD-10-CM | POA: Diagnosis not present

## 2020-04-02 DIAGNOSIS — N2581 Secondary hyperparathyroidism of renal origin: Secondary | ICD-10-CM | POA: Diagnosis not present

## 2020-04-02 DIAGNOSIS — E1122 Type 2 diabetes mellitus with diabetic chronic kidney disease: Secondary | ICD-10-CM | POA: Diagnosis not present

## 2020-04-03 ENCOUNTER — Other Ambulatory Visit: Payer: Self-pay | Admitting: Internal Medicine

## 2020-04-03 DIAGNOSIS — R059 Cough, unspecified: Secondary | ICD-10-CM

## 2020-04-03 DIAGNOSIS — E8779 Other fluid overload: Secondary | ICD-10-CM | POA: Diagnosis not present

## 2020-04-03 DIAGNOSIS — D631 Anemia in chronic kidney disease: Secondary | ICD-10-CM | POA: Diagnosis not present

## 2020-04-03 DIAGNOSIS — E1122 Type 2 diabetes mellitus with diabetic chronic kidney disease: Secondary | ICD-10-CM | POA: Diagnosis not present

## 2020-04-03 DIAGNOSIS — D689 Coagulation defect, unspecified: Secondary | ICD-10-CM | POA: Diagnosis not present

## 2020-04-03 DIAGNOSIS — N2581 Secondary hyperparathyroidism of renal origin: Secondary | ICD-10-CM | POA: Diagnosis not present

## 2020-04-03 DIAGNOSIS — I509 Heart failure, unspecified: Secondary | ICD-10-CM | POA: Diagnosis not present

## 2020-04-03 DIAGNOSIS — I132 Hypertensive heart and chronic kidney disease with heart failure and with stage 5 chronic kidney disease, or end stage renal disease: Secondary | ICD-10-CM | POA: Diagnosis not present

## 2020-04-03 DIAGNOSIS — Z992 Dependence on renal dialysis: Secondary | ICD-10-CM | POA: Diagnosis not present

## 2020-04-03 DIAGNOSIS — N186 End stage renal disease: Secondary | ICD-10-CM | POA: Diagnosis not present

## 2020-04-03 NOTE — Telephone Encounter (Signed)
   Patient calling for referral status of Pulmonary referral; SOB on exertion  Please call patient

## 2020-04-03 NOTE — Telephone Encounter (Signed)
Per previous message:  "Patient requesting order for sleep study, also requesting referral to Pulmonologist . She feels COVID may have damaged her lungs. She states she feels sometimes when taking a deep breath, she begins to cough."   Is it possible that these symptoms could be an issue associated with the potential dx of sleep apnea? If so I can tell the pt you would rather see the sleep study results first before proceeding with a referral to pulmonary. Please advise.

## 2020-04-03 NOTE — Telephone Encounter (Signed)
How does she know that this is pulmonary?

## 2020-04-04 DIAGNOSIS — E8779 Other fluid overload: Secondary | ICD-10-CM | POA: Diagnosis not present

## 2020-04-04 DIAGNOSIS — N2581 Secondary hyperparathyroidism of renal origin: Secondary | ICD-10-CM | POA: Diagnosis not present

## 2020-04-04 DIAGNOSIS — E1122 Type 2 diabetes mellitus with diabetic chronic kidney disease: Secondary | ICD-10-CM | POA: Diagnosis not present

## 2020-04-04 DIAGNOSIS — N186 End stage renal disease: Secondary | ICD-10-CM | POA: Diagnosis not present

## 2020-04-04 DIAGNOSIS — D509 Iron deficiency anemia, unspecified: Secondary | ICD-10-CM | POA: Diagnosis not present

## 2020-04-04 DIAGNOSIS — Z992 Dependence on renal dialysis: Secondary | ICD-10-CM | POA: Diagnosis not present

## 2020-04-04 DIAGNOSIS — I509 Heart failure, unspecified: Secondary | ICD-10-CM | POA: Diagnosis not present

## 2020-04-04 DIAGNOSIS — D631 Anemia in chronic kidney disease: Secondary | ICD-10-CM | POA: Diagnosis not present

## 2020-04-04 DIAGNOSIS — I132 Hypertensive heart and chronic kidney disease with heart failure and with stage 5 chronic kidney disease, or end stage renal disease: Secondary | ICD-10-CM | POA: Diagnosis not present

## 2020-04-05 DIAGNOSIS — E1122 Type 2 diabetes mellitus with diabetic chronic kidney disease: Secondary | ICD-10-CM | POA: Diagnosis not present

## 2020-04-05 DIAGNOSIS — N2581 Secondary hyperparathyroidism of renal origin: Secondary | ICD-10-CM | POA: Diagnosis not present

## 2020-04-05 DIAGNOSIS — I509 Heart failure, unspecified: Secondary | ICD-10-CM | POA: Diagnosis not present

## 2020-04-05 DIAGNOSIS — I132 Hypertensive heart and chronic kidney disease with heart failure and with stage 5 chronic kidney disease, or end stage renal disease: Secondary | ICD-10-CM | POA: Diagnosis not present

## 2020-04-05 DIAGNOSIS — N186 End stage renal disease: Secondary | ICD-10-CM | POA: Diagnosis not present

## 2020-04-05 DIAGNOSIS — E8779 Other fluid overload: Secondary | ICD-10-CM | POA: Diagnosis not present

## 2020-04-05 DIAGNOSIS — D509 Iron deficiency anemia, unspecified: Secondary | ICD-10-CM | POA: Diagnosis not present

## 2020-04-05 DIAGNOSIS — D689 Coagulation defect, unspecified: Secondary | ICD-10-CM | POA: Diagnosis not present

## 2020-04-05 DIAGNOSIS — D631 Anemia in chronic kidney disease: Secondary | ICD-10-CM | POA: Diagnosis not present

## 2020-04-05 DIAGNOSIS — Z992 Dependence on renal dialysis: Secondary | ICD-10-CM | POA: Diagnosis not present

## 2020-04-08 DIAGNOSIS — D631 Anemia in chronic kidney disease: Secondary | ICD-10-CM | POA: Diagnosis not present

## 2020-04-08 DIAGNOSIS — E8779 Other fluid overload: Secondary | ICD-10-CM | POA: Diagnosis not present

## 2020-04-08 DIAGNOSIS — D689 Coagulation defect, unspecified: Secondary | ICD-10-CM | POA: Diagnosis not present

## 2020-04-08 DIAGNOSIS — N2581 Secondary hyperparathyroidism of renal origin: Secondary | ICD-10-CM | POA: Diagnosis not present

## 2020-04-08 DIAGNOSIS — I509 Heart failure, unspecified: Secondary | ICD-10-CM | POA: Diagnosis not present

## 2020-04-08 DIAGNOSIS — Z992 Dependence on renal dialysis: Secondary | ICD-10-CM | POA: Diagnosis not present

## 2020-04-08 DIAGNOSIS — I132 Hypertensive heart and chronic kidney disease with heart failure and with stage 5 chronic kidney disease, or end stage renal disease: Secondary | ICD-10-CM | POA: Diagnosis not present

## 2020-04-08 DIAGNOSIS — N186 End stage renal disease: Secondary | ICD-10-CM | POA: Diagnosis not present

## 2020-04-08 DIAGNOSIS — E1122 Type 2 diabetes mellitus with diabetic chronic kidney disease: Secondary | ICD-10-CM | POA: Diagnosis not present

## 2020-04-08 DIAGNOSIS — D509 Iron deficiency anemia, unspecified: Secondary | ICD-10-CM | POA: Diagnosis not present

## 2020-04-09 DIAGNOSIS — I509 Heart failure, unspecified: Secondary | ICD-10-CM | POA: Diagnosis not present

## 2020-04-09 DIAGNOSIS — I132 Hypertensive heart and chronic kidney disease with heart failure and with stage 5 chronic kidney disease, or end stage renal disease: Secondary | ICD-10-CM | POA: Diagnosis not present

## 2020-04-09 DIAGNOSIS — N2581 Secondary hyperparathyroidism of renal origin: Secondary | ICD-10-CM | POA: Diagnosis not present

## 2020-04-09 DIAGNOSIS — N186 End stage renal disease: Secondary | ICD-10-CM | POA: Diagnosis not present

## 2020-04-09 DIAGNOSIS — D631 Anemia in chronic kidney disease: Secondary | ICD-10-CM | POA: Diagnosis not present

## 2020-04-09 DIAGNOSIS — E1122 Type 2 diabetes mellitus with diabetic chronic kidney disease: Secondary | ICD-10-CM | POA: Diagnosis not present

## 2020-04-09 DIAGNOSIS — Z992 Dependence on renal dialysis: Secondary | ICD-10-CM | POA: Diagnosis not present

## 2020-04-09 DIAGNOSIS — D509 Iron deficiency anemia, unspecified: Secondary | ICD-10-CM | POA: Diagnosis not present

## 2020-04-09 DIAGNOSIS — E8779 Other fluid overload: Secondary | ICD-10-CM | POA: Diagnosis not present

## 2020-04-11 DIAGNOSIS — E1122 Type 2 diabetes mellitus with diabetic chronic kidney disease: Secondary | ICD-10-CM | POA: Diagnosis not present

## 2020-04-11 DIAGNOSIS — D509 Iron deficiency anemia, unspecified: Secondary | ICD-10-CM | POA: Diagnosis not present

## 2020-04-11 DIAGNOSIS — I132 Hypertensive heart and chronic kidney disease with heart failure and with stage 5 chronic kidney disease, or end stage renal disease: Secondary | ICD-10-CM | POA: Diagnosis not present

## 2020-04-11 DIAGNOSIS — E8779 Other fluid overload: Secondary | ICD-10-CM | POA: Diagnosis not present

## 2020-04-11 DIAGNOSIS — D689 Coagulation defect, unspecified: Secondary | ICD-10-CM | POA: Diagnosis not present

## 2020-04-11 DIAGNOSIS — N2581 Secondary hyperparathyroidism of renal origin: Secondary | ICD-10-CM | POA: Diagnosis not present

## 2020-04-11 DIAGNOSIS — Z992 Dependence on renal dialysis: Secondary | ICD-10-CM | POA: Diagnosis not present

## 2020-04-11 DIAGNOSIS — D631 Anemia in chronic kidney disease: Secondary | ICD-10-CM | POA: Diagnosis not present

## 2020-04-11 DIAGNOSIS — N186 End stage renal disease: Secondary | ICD-10-CM | POA: Diagnosis not present

## 2020-04-11 DIAGNOSIS — I509 Heart failure, unspecified: Secondary | ICD-10-CM | POA: Diagnosis not present

## 2020-04-11 IMAGING — US US ABDOMEN LIMITED
1 series · 14 of 25 positions shown · non-contrast
Comparison: None.

CLINICAL DATA: Pancreatitis.

EXAM:
ULTRASOUND ABDOMEN LIMITED RIGHT UPPER QUADRANT

[Series 1: us abdomen limited · 0.24mm/px · 14 of 44 slices shown]
[im 1/44]
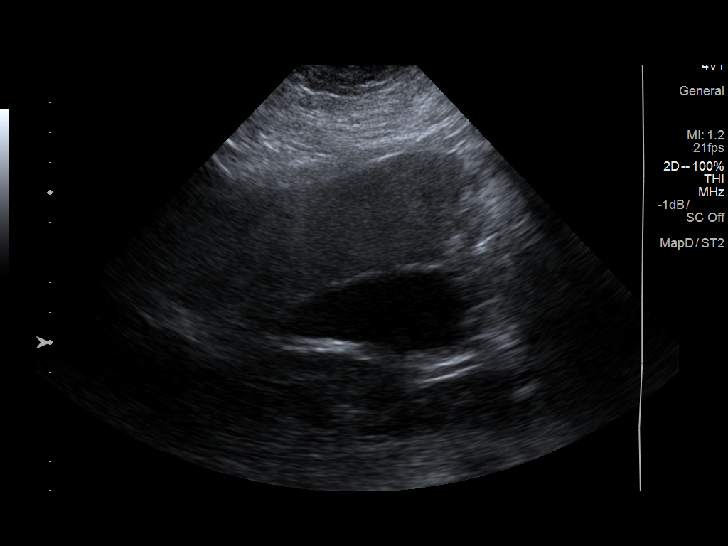
[im 4/44]
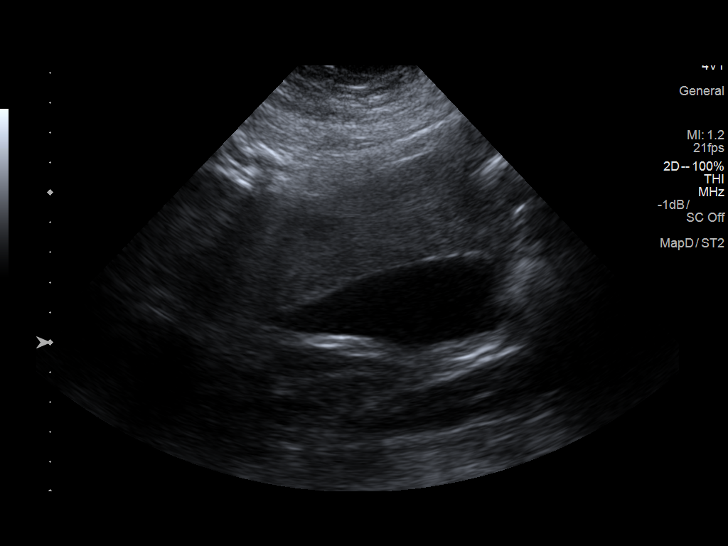
[im 8/44]
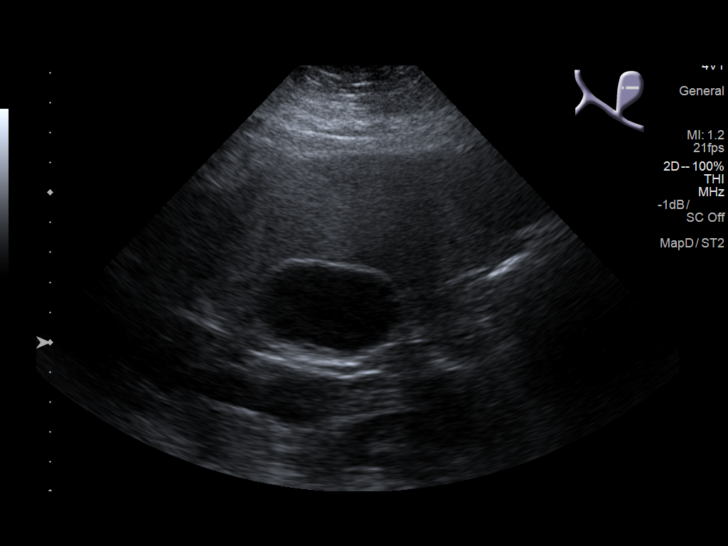
[im 11/44]
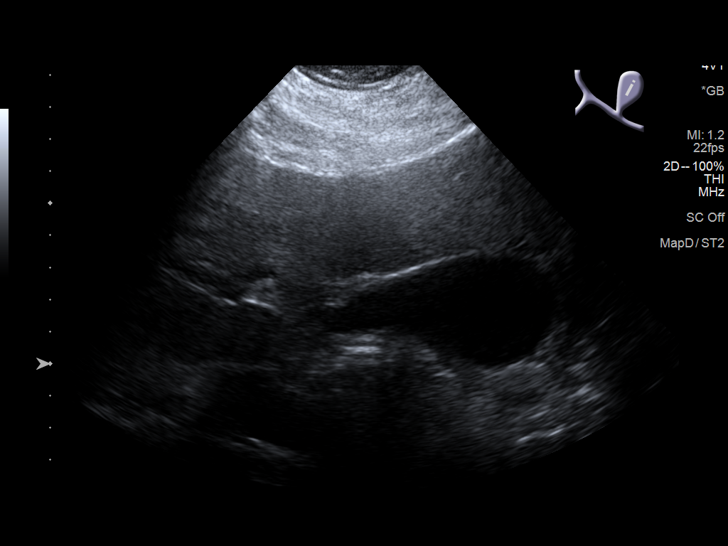
[im 15/44]
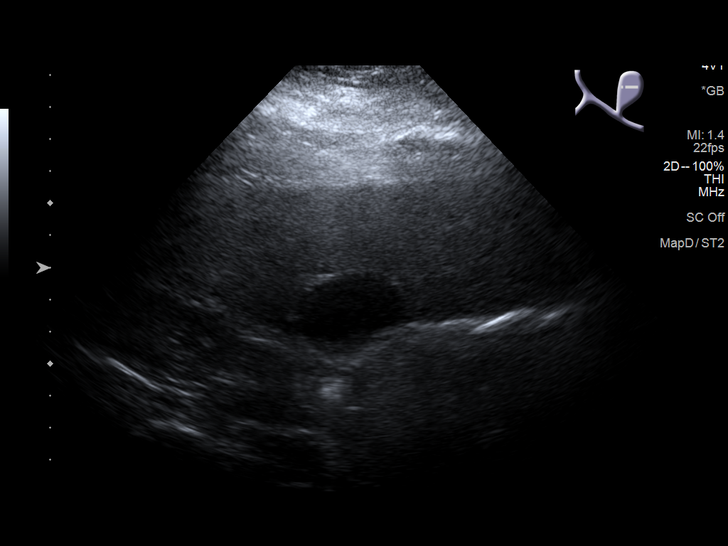
[im 17/44]
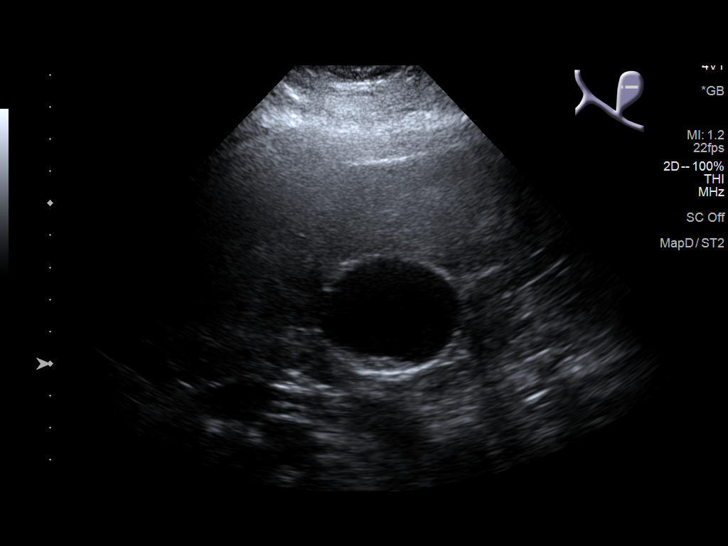
[im 20/44]
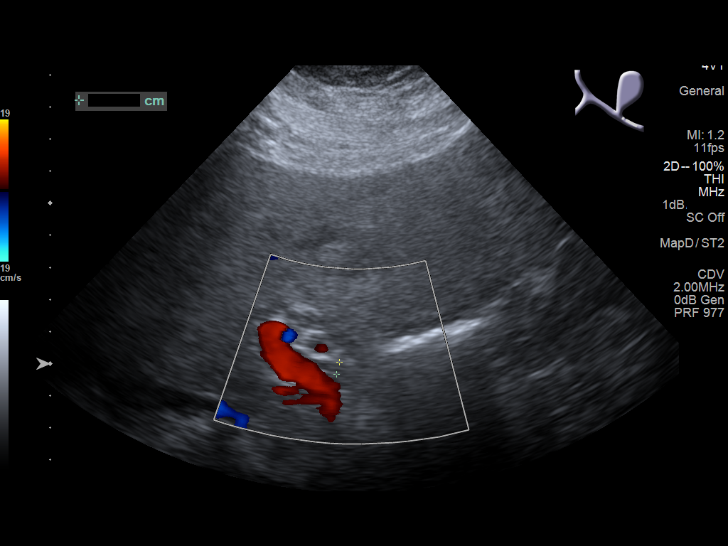
[im 24/44]
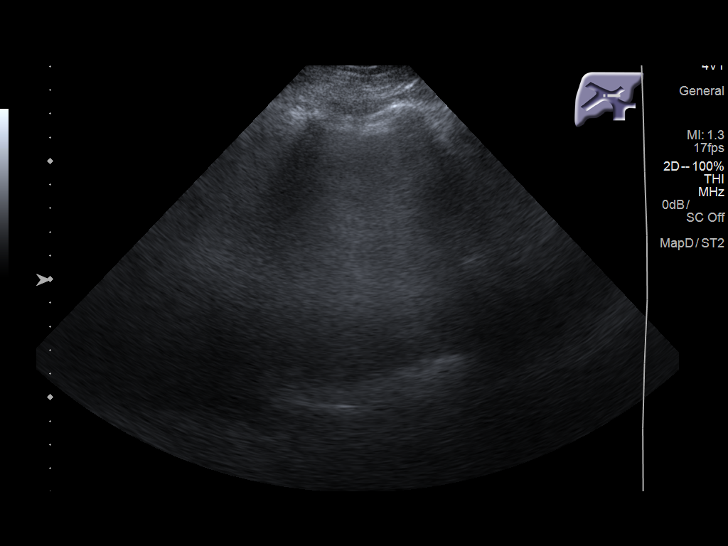
[im 27/44]
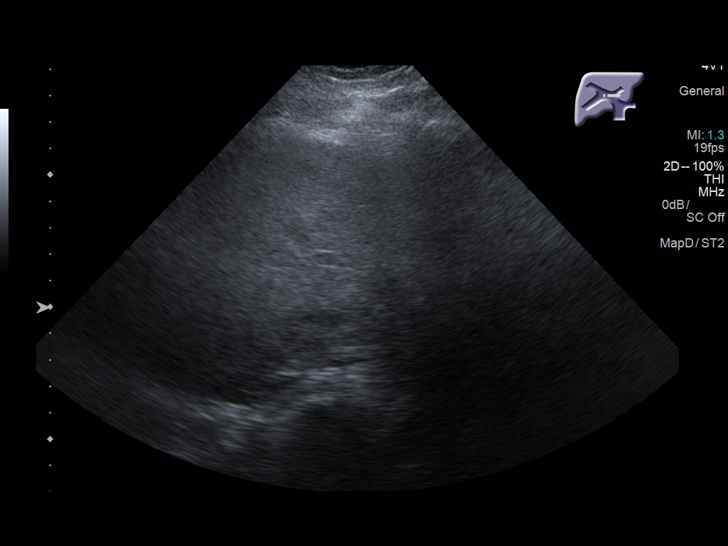
[im 29/44]
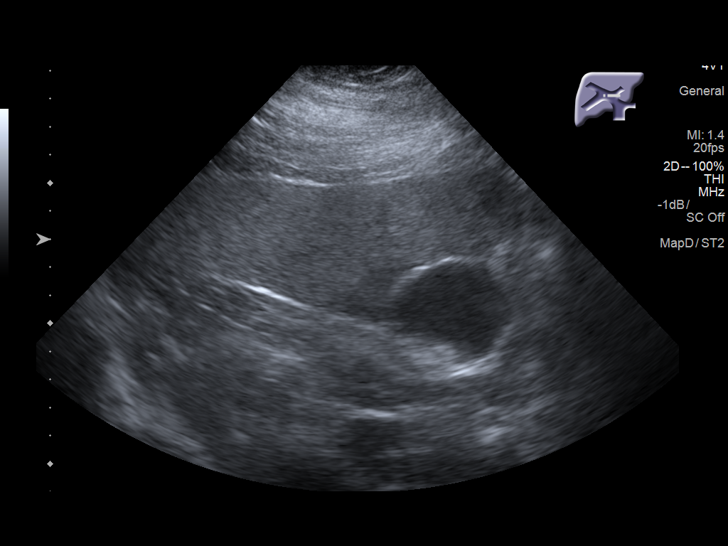
[im 33/44]
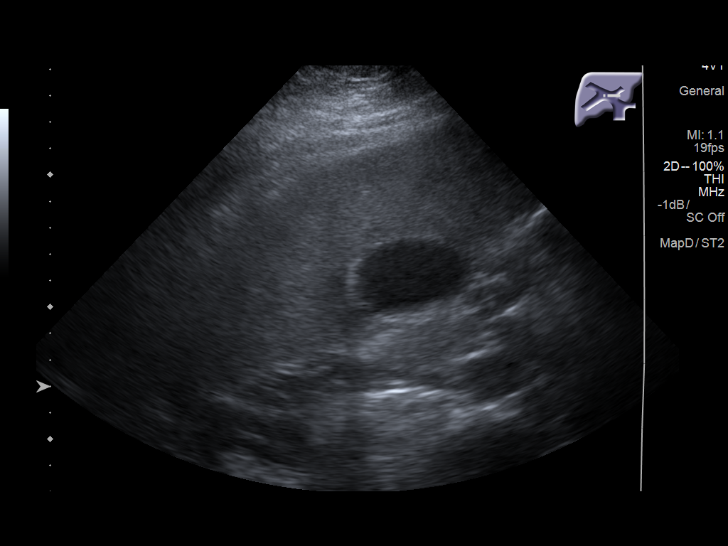
[im 36/44]
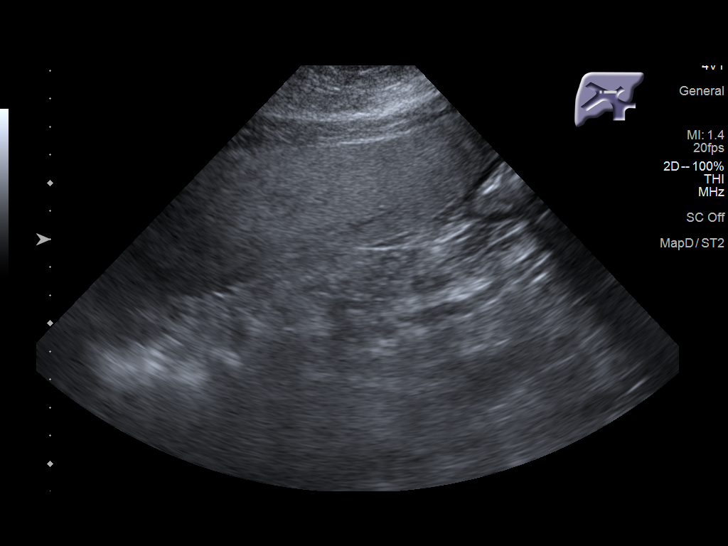
[im 40/44]
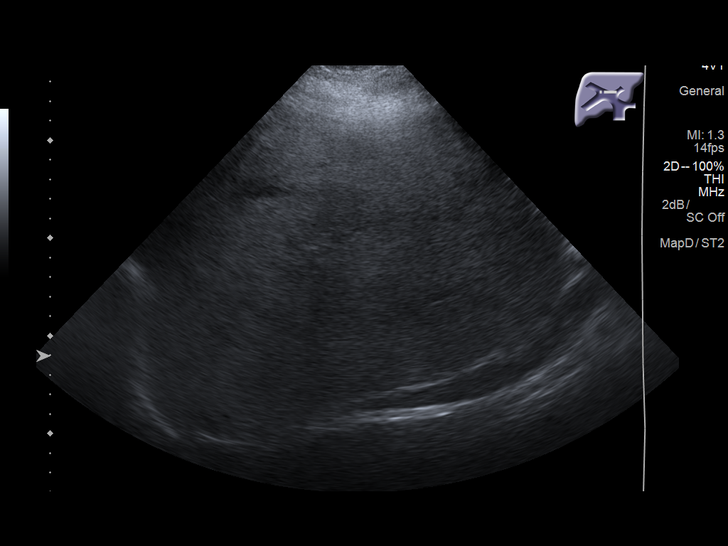
[im 44/44]
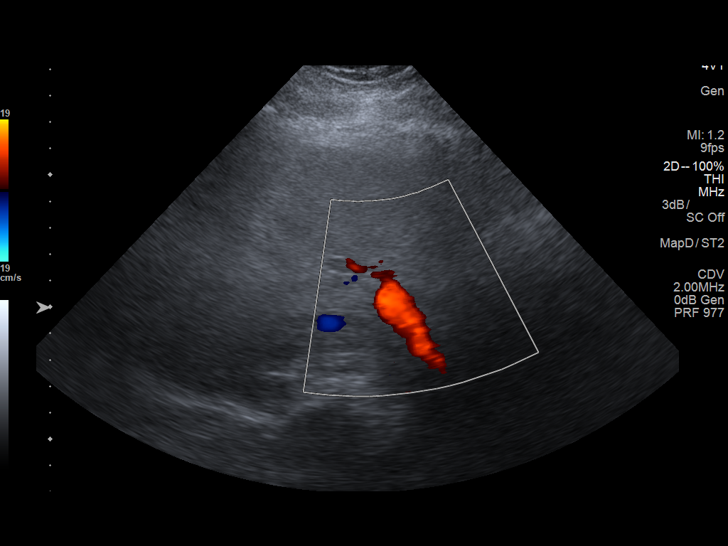

[14 of 25 positions shown; findings below may reference images not displayed]

FINDINGS: Gallbladder:

No wall thickening visualized. There is sludge in the gallbladder.
No sonographic Murphy sign noted by sonographer.

Common bile duct:

Diameter: 4.5 mm

Liver:

No focal lesion identified. There is mild diffuse increased
echotexture of the liver. Portal vein is patent on color Doppler
imaging with normal direction of blood flow towards the liver.
IMPRESSION: Sludge in the gallbladder. No sonographic evidence of acute
cholecystitis.

## 2020-04-12 DIAGNOSIS — N2581 Secondary hyperparathyroidism of renal origin: Secondary | ICD-10-CM | POA: Diagnosis not present

## 2020-04-12 DIAGNOSIS — D631 Anemia in chronic kidney disease: Secondary | ICD-10-CM | POA: Diagnosis not present

## 2020-04-12 DIAGNOSIS — I132 Hypertensive heart and chronic kidney disease with heart failure and with stage 5 chronic kidney disease, or end stage renal disease: Secondary | ICD-10-CM | POA: Diagnosis not present

## 2020-04-12 DIAGNOSIS — Z992 Dependence on renal dialysis: Secondary | ICD-10-CM | POA: Diagnosis not present

## 2020-04-12 DIAGNOSIS — I509 Heart failure, unspecified: Secondary | ICD-10-CM | POA: Diagnosis not present

## 2020-04-12 DIAGNOSIS — E1122 Type 2 diabetes mellitus with diabetic chronic kidney disease: Secondary | ICD-10-CM | POA: Diagnosis not present

## 2020-04-12 DIAGNOSIS — N186 End stage renal disease: Secondary | ICD-10-CM | POA: Diagnosis not present

## 2020-04-12 DIAGNOSIS — E8779 Other fluid overload: Secondary | ICD-10-CM | POA: Diagnosis not present

## 2020-04-12 DIAGNOSIS — D509 Iron deficiency anemia, unspecified: Secondary | ICD-10-CM | POA: Diagnosis not present

## 2020-04-13 DIAGNOSIS — I132 Hypertensive heart and chronic kidney disease with heart failure and with stage 5 chronic kidney disease, or end stage renal disease: Secondary | ICD-10-CM | POA: Diagnosis not present

## 2020-04-13 DIAGNOSIS — D631 Anemia in chronic kidney disease: Secondary | ICD-10-CM | POA: Diagnosis not present

## 2020-04-13 DIAGNOSIS — N186 End stage renal disease: Secondary | ICD-10-CM | POA: Diagnosis not present

## 2020-04-13 DIAGNOSIS — D689 Coagulation defect, unspecified: Secondary | ICD-10-CM | POA: Diagnosis not present

## 2020-04-13 DIAGNOSIS — I509 Heart failure, unspecified: Secondary | ICD-10-CM | POA: Diagnosis not present

## 2020-04-13 DIAGNOSIS — E1122 Type 2 diabetes mellitus with diabetic chronic kidney disease: Secondary | ICD-10-CM | POA: Diagnosis not present

## 2020-04-13 DIAGNOSIS — Z992 Dependence on renal dialysis: Secondary | ICD-10-CM | POA: Diagnosis not present

## 2020-04-13 DIAGNOSIS — E8779 Other fluid overload: Secondary | ICD-10-CM | POA: Diagnosis not present

## 2020-04-13 DIAGNOSIS — N2581 Secondary hyperparathyroidism of renal origin: Secondary | ICD-10-CM | POA: Diagnosis not present

## 2020-04-14 ENCOUNTER — Other Ambulatory Visit: Payer: Self-pay | Admitting: General Practice

## 2020-04-15 DIAGNOSIS — D631 Anemia in chronic kidney disease: Secondary | ICD-10-CM | POA: Diagnosis not present

## 2020-04-15 DIAGNOSIS — I132 Hypertensive heart and chronic kidney disease with heart failure and with stage 5 chronic kidney disease, or end stage renal disease: Secondary | ICD-10-CM | POA: Diagnosis not present

## 2020-04-15 DIAGNOSIS — N186 End stage renal disease: Secondary | ICD-10-CM | POA: Diagnosis not present

## 2020-04-15 DIAGNOSIS — Z992 Dependence on renal dialysis: Secondary | ICD-10-CM | POA: Diagnosis not present

## 2020-04-15 DIAGNOSIS — D689 Coagulation defect, unspecified: Secondary | ICD-10-CM | POA: Diagnosis not present

## 2020-04-15 DIAGNOSIS — N2581 Secondary hyperparathyroidism of renal origin: Secondary | ICD-10-CM | POA: Diagnosis not present

## 2020-04-15 DIAGNOSIS — E1122 Type 2 diabetes mellitus with diabetic chronic kidney disease: Secondary | ICD-10-CM | POA: Diagnosis not present

## 2020-04-15 DIAGNOSIS — D509 Iron deficiency anemia, unspecified: Secondary | ICD-10-CM | POA: Diagnosis not present

## 2020-04-15 DIAGNOSIS — E8779 Other fluid overload: Secondary | ICD-10-CM | POA: Diagnosis not present

## 2020-04-15 DIAGNOSIS — I509 Heart failure, unspecified: Secondary | ICD-10-CM | POA: Diagnosis not present

## 2020-04-16 DIAGNOSIS — N186 End stage renal disease: Secondary | ICD-10-CM | POA: Diagnosis not present

## 2020-04-16 DIAGNOSIS — I509 Heart failure, unspecified: Secondary | ICD-10-CM | POA: Diagnosis not present

## 2020-04-16 DIAGNOSIS — I132 Hypertensive heart and chronic kidney disease with heart failure and with stage 5 chronic kidney disease, or end stage renal disease: Secondary | ICD-10-CM | POA: Diagnosis not present

## 2020-04-16 DIAGNOSIS — E8779 Other fluid overload: Secondary | ICD-10-CM | POA: Diagnosis not present

## 2020-04-16 DIAGNOSIS — Z992 Dependence on renal dialysis: Secondary | ICD-10-CM | POA: Diagnosis not present

## 2020-04-16 DIAGNOSIS — N2581 Secondary hyperparathyroidism of renal origin: Secondary | ICD-10-CM | POA: Diagnosis not present

## 2020-04-16 DIAGNOSIS — D509 Iron deficiency anemia, unspecified: Secondary | ICD-10-CM | POA: Diagnosis not present

## 2020-04-16 DIAGNOSIS — E1122 Type 2 diabetes mellitus with diabetic chronic kidney disease: Secondary | ICD-10-CM | POA: Diagnosis not present

## 2020-04-16 DIAGNOSIS — D631 Anemia in chronic kidney disease: Secondary | ICD-10-CM | POA: Diagnosis not present

## 2020-04-17 DIAGNOSIS — D631 Anemia in chronic kidney disease: Secondary | ICD-10-CM | POA: Diagnosis not present

## 2020-04-17 DIAGNOSIS — I132 Hypertensive heart and chronic kidney disease with heart failure and with stage 5 chronic kidney disease, or end stage renal disease: Secondary | ICD-10-CM | POA: Diagnosis not present

## 2020-04-17 DIAGNOSIS — Z992 Dependence on renal dialysis: Secondary | ICD-10-CM | POA: Diagnosis not present

## 2020-04-17 DIAGNOSIS — N2581 Secondary hyperparathyroidism of renal origin: Secondary | ICD-10-CM | POA: Diagnosis not present

## 2020-04-17 DIAGNOSIS — N186 End stage renal disease: Secondary | ICD-10-CM | POA: Diagnosis not present

## 2020-04-17 DIAGNOSIS — E1122 Type 2 diabetes mellitus with diabetic chronic kidney disease: Secondary | ICD-10-CM | POA: Diagnosis not present

## 2020-04-17 DIAGNOSIS — D689 Coagulation defect, unspecified: Secondary | ICD-10-CM | POA: Diagnosis not present

## 2020-04-17 DIAGNOSIS — E8779 Other fluid overload: Secondary | ICD-10-CM | POA: Diagnosis not present

## 2020-04-17 DIAGNOSIS — I509 Heart failure, unspecified: Secondary | ICD-10-CM | POA: Diagnosis not present

## 2020-04-18 DIAGNOSIS — E8779 Other fluid overload: Secondary | ICD-10-CM | POA: Diagnosis not present

## 2020-04-18 DIAGNOSIS — D689 Coagulation defect, unspecified: Secondary | ICD-10-CM | POA: Diagnosis not present

## 2020-04-18 DIAGNOSIS — D631 Anemia in chronic kidney disease: Secondary | ICD-10-CM | POA: Diagnosis not present

## 2020-04-18 DIAGNOSIS — I132 Hypertensive heart and chronic kidney disease with heart failure and with stage 5 chronic kidney disease, or end stage renal disease: Secondary | ICD-10-CM | POA: Diagnosis not present

## 2020-04-18 DIAGNOSIS — Z992 Dependence on renal dialysis: Secondary | ICD-10-CM | POA: Diagnosis not present

## 2020-04-18 DIAGNOSIS — E1122 Type 2 diabetes mellitus with diabetic chronic kidney disease: Secondary | ICD-10-CM | POA: Diagnosis not present

## 2020-04-18 DIAGNOSIS — D509 Iron deficiency anemia, unspecified: Secondary | ICD-10-CM | POA: Diagnosis not present

## 2020-04-18 DIAGNOSIS — I509 Heart failure, unspecified: Secondary | ICD-10-CM | POA: Diagnosis not present

## 2020-04-18 DIAGNOSIS — N186 End stage renal disease: Secondary | ICD-10-CM | POA: Diagnosis not present

## 2020-04-18 DIAGNOSIS — N2581 Secondary hyperparathyroidism of renal origin: Secondary | ICD-10-CM | POA: Diagnosis not present

## 2020-04-20 DIAGNOSIS — I509 Heart failure, unspecified: Secondary | ICD-10-CM | POA: Diagnosis not present

## 2020-04-20 DIAGNOSIS — D689 Coagulation defect, unspecified: Secondary | ICD-10-CM | POA: Diagnosis not present

## 2020-04-20 DIAGNOSIS — E1122 Type 2 diabetes mellitus with diabetic chronic kidney disease: Secondary | ICD-10-CM | POA: Diagnosis not present

## 2020-04-20 DIAGNOSIS — I132 Hypertensive heart and chronic kidney disease with heart failure and with stage 5 chronic kidney disease, or end stage renal disease: Secondary | ICD-10-CM | POA: Diagnosis not present

## 2020-04-20 DIAGNOSIS — E8779 Other fluid overload: Secondary | ICD-10-CM | POA: Diagnosis not present

## 2020-04-20 DIAGNOSIS — D631 Anemia in chronic kidney disease: Secondary | ICD-10-CM | POA: Diagnosis not present

## 2020-04-20 DIAGNOSIS — N2581 Secondary hyperparathyroidism of renal origin: Secondary | ICD-10-CM | POA: Diagnosis not present

## 2020-04-20 DIAGNOSIS — Z992 Dependence on renal dialysis: Secondary | ICD-10-CM | POA: Diagnosis not present

## 2020-04-20 DIAGNOSIS — N186 End stage renal disease: Secondary | ICD-10-CM | POA: Diagnosis not present

## 2020-04-22 ENCOUNTER — Other Ambulatory Visit: Payer: Self-pay | Admitting: Internal Medicine

## 2020-04-22 ENCOUNTER — Telehealth: Payer: Self-pay | Admitting: Cardiovascular Disease

## 2020-04-22 DIAGNOSIS — I5032 Chronic diastolic (congestive) heart failure: Secondary | ICD-10-CM

## 2020-04-22 NOTE — Telephone Encounter (Signed)
Pt c/o medication issue:  1. Name of Medication: BRILINTA 90 MG TABS tablet isosorbide dinitrate (ISORDIL) 20 MG tablet rosuvastatin (CRESTOR) 20 MG tablet gabapentin (NEURONTIN) 100 MG capsule  2. How are you currently taking this medication (dosage and times per day)? yes  3. Are you having a reaction (difficulty breathing--STAT)? No  4. What is your medication issue? Pt changed coverage from [place] to [facility], Pt would like to know if she can get these 4 scripts in a Generic form. Pt states Brilinta increased from $20 to $375.00 and Rosuvastatin cost increased from $7.00 to $175.00. Pt states that her current pharmacy does not have Isosorbede dinitrate and if she needs to change to a different pharmacy she will.    *STAT* If patient is at the pharmacy, call can be transferred to refill team.   1. Which medications need to be refilled? (please list name of each medication and dose if known) isosorbide dinitrate (ISORDIL) 20 MG tablet gabapentin (NEURONTIN) 100 MG capsule  2. Which pharmacy/location (including street and city if local pharmacy) is medication to be sent to?[id number] [place], [place] - [address]  3. Do they need a 30 day or 90 day supply? 90 if possible       [id number] [place] - [address]

## 2020-04-23 DIAGNOSIS — N2581 Secondary hyperparathyroidism of renal origin: Secondary | ICD-10-CM | POA: Diagnosis not present

## 2020-04-23 DIAGNOSIS — D631 Anemia in chronic kidney disease: Secondary | ICD-10-CM | POA: Diagnosis not present

## 2020-04-23 DIAGNOSIS — N186 End stage renal disease: Secondary | ICD-10-CM | POA: Diagnosis not present

## 2020-04-23 DIAGNOSIS — Z992 Dependence on renal dialysis: Secondary | ICD-10-CM | POA: Diagnosis not present

## 2020-04-23 DIAGNOSIS — E8779 Other fluid overload: Secondary | ICD-10-CM | POA: Diagnosis not present

## 2020-04-23 DIAGNOSIS — I509 Heart failure, unspecified: Secondary | ICD-10-CM | POA: Diagnosis not present

## 2020-04-23 DIAGNOSIS — E1122 Type 2 diabetes mellitus with diabetic chronic kidney disease: Secondary | ICD-10-CM | POA: Diagnosis not present

## 2020-04-23 DIAGNOSIS — D689 Coagulation defect, unspecified: Secondary | ICD-10-CM | POA: Diagnosis not present

## 2020-04-23 DIAGNOSIS — I132 Hypertensive heart and chronic kidney disease with heart failure and with stage 5 chronic kidney disease, or end stage renal disease: Secondary | ICD-10-CM | POA: Diagnosis not present

## 2020-04-23 MED ORDER — CARVEDILOL 25 MG PO TABS: 25.0000 mg | ORAL_TABLET | Freq: Two times a day (BID) | ORAL | 3 refills | Status: AC

## 2020-04-23 MED ORDER — ISOSORBIDE DINITRATE 20 MG PO TABS: 20.0000 mg | ORAL_TABLET | Freq: Two times a day (BID) | ORAL | 3 refills | Status: DC

## 2020-04-23 MED ORDER — ROSUVASTATIN CALCIUM 20 MG PO TABS: 20.0000 mg | ORAL_TABLET | Freq: Every day | ORAL | 3 refills | Status: AC

## 2020-04-23 NOTE — Telephone Encounter (Signed)
Checked medication, many medication reills sent in Feb or march refilled rosuvastatin,isosorbide and carvedilol sent to requested pharmacy.  Called pt and informed that I have sent refills and will do Prior Auth for Brilinta and rosuvastatin. Verified insurance -Government social research officer.  Called pharmacy and rep states that brilinta does not need PA it is a covered medication and pt's insurance must have a high deductible/copay. And rosuvastatin is $0 copay. Pt notified, she states that she had to change insurance to receive dental and that insurance people did not tell her she had an pharmacy copay. [place] application to fill out is with samples. Left detailed message for pt to fill out ASAP and bring back to be faxed.  samples at the front desk.  Medication Samples have been provided to the patient. Drug name: Brilinta Strength: 42m Qty: 4 bottles/8 tab each LOT: [id number] Exp.Date: 2022-08-19 Dosing instructions: One tab twice daily  The patient has been instructed regarding the correct time, dose, and frequency of taking this medication.  Samples place at the front desk for pt pick-up.  MWaylan Rocher10:11 AM 04/23/2020

## 2020-04-24 MED ORDER — ISOSORBIDE MONONITRATE ER 30 MG PO TB24: 30.0000 mg | ORAL_TABLET | Freq: Every day | ORAL | 6 refills | Status: AC

## 2020-04-24 NOTE — Telephone Encounter (Signed)
     Pt c/o medication issue:  1. Name of Medication: isosorbide  2. How are you currently taking this medication (dosage and times per day)?   3. Are you having a reaction (difficulty breathing--STAT)?   4. What is your medication issue? Pt is calling back, she said when she tried picking up her prescription isosorbide also increased price to $145 and she can't afford it, she is asking if she can get a generic alternative

## 2020-04-24 NOTE — Telephone Encounter (Signed)
Called pt back she states tht she called [employer] yesterday to discuss Deductible and they told her that her deductable is down to $95. She states that she spoke with them about the brilinta and it is tier 3-4 and still will be $300+. I will try to do a tier exception.

## 2020-04-25 ENCOUNTER — Other Ambulatory Visit: Payer: Self-pay

## 2020-04-25 ENCOUNTER — Ambulatory Visit (INDEPENDENT_AMBULATORY_CARE_PROVIDER_SITE_OTHER): Payer: Medicare HMO | Admitting: Internal Medicine

## 2020-04-25 ENCOUNTER — Encounter: Payer: Self-pay | Admitting: Internal Medicine

## 2020-04-25 VITALS — BP 184/84 | HR 78 | Temp 97.2°F | Ht 65.0 in | Wt 281.2 lb

## 2020-04-25 DIAGNOSIS — U071 COVID-19: Secondary | ICD-10-CM

## 2020-04-25 DIAGNOSIS — R0602 Shortness of breath: Secondary | ICD-10-CM | POA: Diagnosis not present

## 2020-04-25 DIAGNOSIS — G4733 Obstructive sleep apnea (adult) (pediatric): Secondary | ICD-10-CM

## 2020-04-25 DIAGNOSIS — N186 End stage renal disease: Secondary | ICD-10-CM | POA: Diagnosis not present

## 2020-04-25 DIAGNOSIS — I509 Heart failure, unspecified: Secondary | ICD-10-CM | POA: Diagnosis not present

## 2020-04-25 DIAGNOSIS — N2581 Secondary hyperparathyroidism of renal origin: Secondary | ICD-10-CM | POA: Diagnosis not present

## 2020-04-25 DIAGNOSIS — D631 Anemia in chronic kidney disease: Secondary | ICD-10-CM | POA: Diagnosis not present

## 2020-04-25 DIAGNOSIS — E8779 Other fluid overload: Secondary | ICD-10-CM | POA: Diagnosis not present

## 2020-04-25 DIAGNOSIS — D689 Coagulation defect, unspecified: Secondary | ICD-10-CM | POA: Diagnosis not present

## 2020-04-25 DIAGNOSIS — Z0271 Encounter for disability determination: Secondary | ICD-10-CM

## 2020-04-25 DIAGNOSIS — E1122 Type 2 diabetes mellitus with diabetic chronic kidney disease: Secondary | ICD-10-CM | POA: Diagnosis not present

## 2020-04-25 DIAGNOSIS — Z992 Dependence on renal dialysis: Secondary | ICD-10-CM | POA: Diagnosis not present

## 2020-04-25 DIAGNOSIS — I132 Hypertensive heart and chronic kidney disease with heart failure and with stage 5 chronic kidney disease, or end stage renal disease: Secondary | ICD-10-CM | POA: Diagnosis not present

## 2020-04-25 NOTE — Patient Instructions (Addendum)
The patient should have follow up scheduled with myself in 1 months.   Prior to next visit patient should have: Full set of PFTs - 1 hour, next available with appointment.  Split night study (sleep study)

## 2020-04-25 NOTE — Progress Notes (Signed)
AMRI LIEN    [id number]    12/12/1965  Primary Care Physician:Jones, Arvid Right, MD  Referring Physician: [name], MD [address] [place],  [place] [address] Reason for Consultation: shortness of breath after covid Date of Consultation: 04/25/2020  Chief complaint:   Chief Complaint  Patient presents with  . Consult    Sob with laying down and with exertion.  Hx of covid x2, feb 2021 and feb 2022.     HPI: Brenda Davis is a 55 y.o. with history of ESRD on HD (hypertensive nephropathy) and has had covid 19 infection twice, treated with remdesevir, hospitalized for 2 weeks.  Felt very deconditioned after this. She is here for dyspnea on exertion as well as with laying flat.  She has cough that worsens with taking a deep breath in. She also has ongoing palpitations as well as fatigue.  She is also trying to get a kidney transplant. She gets short of breath with minimal exertion - from waiting room to the office. She also has to use a buggy when she goes to the grocery store.   In Feb 2021 she had LHC which showed stenosis in the LAD, there was 99% stenosis of the OM stent she had placed which was felt to be tortuous and difficult to intervene on with high likelihood for complications/occlusion.   She also has problems with her sleep and wakes up gasping for air, heart palpitations. Does better breathing on her right side, rather than her left side. She does snore. She is very concerned for sleep apnea.   She does not take any inhalers or breathing treatments. She has been given albuterol in the past and doesn't think it helps.   She has been on dialysis since 2016. She has a LUE AVF. Dry weight 126 kg and she does a pretty good job of staying close to that.   Social history:  Occupation: she is disabled, used to be Therapist, art rep for [employer] Exposures: lives at home with husband. No pets.   Social History   Occupational History  .  Occupation: Research scientist (physical sciences): [employer]  Tobacco Use  . Smoking status: Former Smoker    Packs/day: 1.00    Years: 16.00    Pack years: 16.00    Types: Cigarettes    Quit date: 01/19/2001    Years since quitting: 19.2  . Smokeless tobacco: Never Used  Vaping Use  . Vaping Use: Never used  Substance and Sexual Activity  . Alcohol use: Never  . Drug use: No  . Sexual activity: Yes    Birth control/protection: Post-menopausal    Relevant family history:  Family History  Problem Relation Age of Onset  . Hypertension Mother   . Mitral valve prolapse Mother   . Diabetes Father   . Arthritis Other   . Hyperlipidemia Other   . Cancer Neg Hx   . Heart disease Neg Hx   . Kidney disease Neg Hx   . Stroke Neg Hx     Past Medical History:  Diagnosis Date  . Anemia   . CAD (coronary artery disease)    a. cath in 02/2018 showing moderate 3-vessel CAD with 45% mid-LADm 55% LCx and 65% RCA stenosis which was not significant by FFR  . ESRD on dialysis ([facility])   . Gout   . HCAP (healthcare-associated pneumonia) 05/06/2016  . History of blood transfusion    "  related to surgery"  . Hyperlipidemia   . Hypertension   . Morbid obesity ([place])   . Pain    LEFT SHOULDER PAIN - WAS SEEN AT AN URGENT CARE - GIVEN SLING FOR COMFORT AND TOLD ROM AS TOLERATED.  Marland Kitchen Palpitations 09/24/2016  . Peritonitis, dialysis-associated ([place])   . SVT (supraventricular tachycardia) ([place]) 02/28/2020  . Type II diabetes mellitus ([place])    "gastric sleeve OR corrected this" (05/06/2016)    Past Surgical History:  Procedure Laterality Date  . AV FISTULA PLACEMENT Left 01/03/2015   Procedure: BRACHIAL CEPHALIC ARTERIOVENOUS  FISTULA CREATION LEFT ARM;  Surgeon: Angelia Mould, MD;  Location: [facility];  Service: Vascular;  Laterality: Left;  . CARPAL TUNNEL RELEASE Right   . [place]  . CHOLECYSTECTOMY  09/14/2017  . CORONARY STENT INTERVENTION N/A 01/17/2019   Procedure: CORONARY  STENT INTERVENTION;  Surgeon: Jettie Booze, MD;  Location: [facility];  Service: Cardiovascular;  Laterality: N/A;  . ESOPHAGOGASTRODUODENOSCOPY N/A 09/04/2012   Procedure: ESOPHAGOGASTRODUODENOSCOPY (EGD);  Surgeon: Shann Medal, MD;  Location: [facility];  Service: General;  Laterality: N/A;  PF  . ESOPHAGOGASTRODUODENOSCOPY (EGD) WITH ESOPHAGEAL DILATION N/A 09/29/2012   Procedure: ESOPHAGOGASTRODUODENOSCOPY (EGD) WITH ESOPHAGEAL DILATION;  Surgeon: Milus Banister, MD;  Location: [facility];  Service: Endoscopy;  Laterality: N/A;  . FEMORAL ARTERY EXPLORATION Right 01/17/2019   Procedure: Exploration Right Groin with Primary Closure or Arteriotomy Site; Washout of Retroperitoneal Hematoma;  Surgeon: Waynetta Sandy, MD;  Location: [facility];  Service: Vascular;  Laterality: Right;  . INSERTION OF DIALYSIS CATHETER N/A 01/03/2015   Procedure: INSERTION OF DIALYSIS CATHETER RIGHT INTERNAL JUGULAR;  Surgeon: Angelia Mould, MD;  Location: [facility];  Service: Vascular;  Laterality: N/A;  . LAPAROSCOPIC GASTRIC SLEEVE RESECTION N/A 07/19/2012   Procedure: LAPAROSCOPIC SLEEVE GASTRECTOMY with EGD;  Surgeon: Madilyn Hook, DO;  Location: [facility];  Service: General;  Laterality: N/A;  laparoscopic sleeve gastrectomy with EGD  . LEFT HEART CATH AND CORONARY ANGIOGRAPHY N/A 02/25/2018   Procedure: LEFT HEART CATH AND CORONARY ANGIOGRAPHY;  Surgeon: Leonie Man, MD;  Location: [facility];  Service: Cardiovascular;  Laterality: N/A;  . LEFT HEART CATH AND CORONARY ANGIOGRAPHY N/A 06/20/2018   Procedure: LEFT HEART CATH AND CORONARY ANGIOGRAPHY;  Surgeon: Nelva Bush, MD;  Location: [facility];  Service: Cardiovascular;  Laterality: N/A;  . LEFT HEART CATH AND CORONARY ANGIOGRAPHY N/A 01/16/2019   Procedure: LEFT HEART CATH AND CORONARY ANGIOGRAPHY;  Surgeon: Jettie Booze, MD;  Location: [facility];  Service: Cardiovascular;  Laterality: N/A;  .  LEFT HEART CATH AND CORONARY ANGIOGRAPHY N/A 03/03/2019   Procedure: LEFT HEART CATH AND CORONARY ANGIOGRAPHY;  Surgeon: Jettie Booze, MD;  Location: [facility];  Service: Cardiovascular;  Laterality: N/A;  . PERIPHERAL VASCULAR BALLOON ANGIOPLASTY Left 08/18/2019   Procedure: PERIPHERAL VASCULAR BALLOON ANGIOPLASTY;  Surgeon: Marty Heck, MD;  Location: [facility];  Service: Cardiovascular;  Laterality: Left;  arm fistula  . PERIPHERAL VASCULAR CATHETERIZATION Left 05/23/2015   Procedure: Nolon Stalls;  Surgeon: Conrad Nashua, MD;  Location: [facility];  Service: Cardiovascular;  Laterality: Left;  upper aRM  . PERIPHERAL VASCULAR CATHETERIZATION Left 05/23/2015   Procedure: Peripheral Vascular Balloon Angioplasty;  Surgeon: Conrad Burleigh, MD;  Location: [facility];  Service: Cardiovascular;  Laterality: Left;  av fistula  . TUBAL LIGATION  1993  .  UPPER GI ENDOSCOPY N/A 07/19/2012   Procedure: UPPER GI ENDOSCOPY;  Surgeon: Madilyn Hook, DO;  Location: [facility];  Service: General;  Laterality: N/A;     Physical Exam: Blood pressure (!) 184/84, pulse 78, temperature (!) 97.2 F (36.2 C), temperature source Temporal, height 5' 5"  (1.651 m), weight 281 lb 3.2 oz (127.6 kg), SpO2 100 %. Gen:      No acute distress ENT:  no nasal polyps, mucus membranes moist Lungs:    No increased respiratory effort, symmetric chest wall excursion, clear to auscultation bilaterally, no wheezes or crackles CV:         Regular rate and rhythm; soft systolic murmur  No pedal edema Abd:      + bowel sounds; soft, non-tender; no distension MSK: no acute synovitis of DIP or PIP joints, no mechanics hands.  Skin:      Warm and dry; no rashes Neuro: normal speech, no focal facial asymmetry Psych: alert and oriented x3, normal mood and affect Ext: LUE AVF with +thrill and bruit  Data Reviewed/Medical Decision Making:  Independent interpretation of tests: Imaging: . Review of  patient's chest xray March 2021 shows low lung volumes, calcified aortic knob.  The patient's images have been independently reviewed by me.    PFTs: None on file  Labs:  Lab Results  Component Value Date   WBC 3.8 02/28/2020   HGB 8.1 (L) 02/28/2020   HCT 25.0 (L) 02/28/2020   MCV 98 (H) 02/28/2020   PLT 218 02/28/2020   Lab Results  Component Value Date   NA 139 02/28/2020   K 5.6 (H) 02/28/2020   CL 95 (L) 02/28/2020   CO2 26 02/28/2020     Immunization status:  Immunization History  Administered Date(s) Administered  . Hepatitis A, Adult 06/16/2018  . Influenza Split 10/21/2011  . Influenza Whole 01/11/2008  . Influenza,inj,Quad PF,6+ Mos 09/17/2015  . Influenza-Unspecified 09/28/2017  . Pneumococcal Conjugate-13 02/10/2017  . Pneumococcal Polysaccharide-23 02/08/2008, 07/20/2012, 02/12/2017, 12/21/2017  . Pneumococcal-Unspecified 02/12/2017  . Tdap 09/17/2015    . I reviewed prior external note(s) from PCP, hospital stay, cardiology . I reviewed the result(s) of the labs and imaging as noted above.  . I have ordered PFT, PSG   Assessment:  Dyspnea on exertion ESRD on HD High concern for OSA  CAD s/p PCI  Plan/Recommendations: Will order PFTs to evaluate her dyspnea further.  COPD, deconditioning, restriction from body habitus all possibilities.  High risk for OSA. Will order split night study.  She appears at her goal dry weight.   Return to Care: Return in about 4 weeks (around 05/23/2020).  Lenice Llamas, MD Pulmonary and [place]  CC: [name], MD

## 2020-04-26 ENCOUNTER — Telehealth: Payer: Self-pay | Admitting: *Deleted

## 2020-04-26 NOTE — Telephone Encounter (Signed)
Message sent to Dr. Shearon Stalls to clarify which sleep study she would like to have done.  Split night study ordered and other study cancelled.  Nothing further needed.

## 2020-04-26 NOTE — Addendum Note (Signed)
Addended by: Vanessa Barbara on: 04/26/2020 09:17 AM   Modules accepted: Orders

## 2020-04-27 DIAGNOSIS — N186 End stage renal disease: Secondary | ICD-10-CM | POA: Diagnosis not present

## 2020-04-27 DIAGNOSIS — I132 Hypertensive heart and chronic kidney disease with heart failure and with stage 5 chronic kidney disease, or end stage renal disease: Secondary | ICD-10-CM | POA: Diagnosis not present

## 2020-04-27 DIAGNOSIS — D689 Coagulation defect, unspecified: Secondary | ICD-10-CM | POA: Diagnosis not present

## 2020-04-27 DIAGNOSIS — E1122 Type 2 diabetes mellitus with diabetic chronic kidney disease: Secondary | ICD-10-CM | POA: Diagnosis not present

## 2020-04-27 DIAGNOSIS — I509 Heart failure, unspecified: Secondary | ICD-10-CM | POA: Diagnosis not present

## 2020-04-27 DIAGNOSIS — D631 Anemia in chronic kidney disease: Secondary | ICD-10-CM | POA: Diagnosis not present

## 2020-04-27 DIAGNOSIS — E8779 Other fluid overload: Secondary | ICD-10-CM | POA: Diagnosis not present

## 2020-04-27 DIAGNOSIS — Z992 Dependence on renal dialysis: Secondary | ICD-10-CM | POA: Diagnosis not present

## 2020-04-27 DIAGNOSIS — N2581 Secondary hyperparathyroidism of renal origin: Secondary | ICD-10-CM | POA: Diagnosis not present

## 2020-04-29 DIAGNOSIS — N186 End stage renal disease: Secondary | ICD-10-CM | POA: Diagnosis not present

## 2020-04-29 DIAGNOSIS — Z992 Dependence on renal dialysis: Secondary | ICD-10-CM | POA: Diagnosis not present

## 2020-04-29 DIAGNOSIS — I509 Heart failure, unspecified: Secondary | ICD-10-CM | POA: Diagnosis not present

## 2020-04-29 DIAGNOSIS — D631 Anemia in chronic kidney disease: Secondary | ICD-10-CM | POA: Diagnosis not present

## 2020-04-29 DIAGNOSIS — E8779 Other fluid overload: Secondary | ICD-10-CM | POA: Diagnosis not present

## 2020-04-29 DIAGNOSIS — E1122 Type 2 diabetes mellitus with diabetic chronic kidney disease: Secondary | ICD-10-CM | POA: Diagnosis not present

## 2020-04-29 DIAGNOSIS — N2581 Secondary hyperparathyroidism of renal origin: Secondary | ICD-10-CM | POA: Diagnosis not present

## 2020-04-29 DIAGNOSIS — I132 Hypertensive heart and chronic kidney disease with heart failure and with stage 5 chronic kidney disease, or end stage renal disease: Secondary | ICD-10-CM | POA: Diagnosis not present

## 2020-04-29 DIAGNOSIS — D689 Coagulation defect, unspecified: Secondary | ICD-10-CM | POA: Diagnosis not present

## 2020-04-29 NOTE — Telephone Encounter (Signed)
Pt c/o medication issue:   1. Name of Medication: Isosorbide Mononitrate 30 mg Oral Daily   2. How are you currently taking this medication (dosage and times per day)? Pt will start taking this medication tomorrow  3. Are you having a reaction (difficulty breathing--STAT)?   4. What is your medication issue? Patient's husband picked up this medication from the pharmacy on Saturday. She wanted to make sure this was the correct medication before she started taking it

## 2020-04-29 NOTE — Telephone Encounter (Signed)
Spoke with the pts wife and advised she should be taking the Isosorbide Mononitrate/ IMDUR 30 mg daily and she confirmed that is what was filled at her pharmacy.

## 2020-04-30 ENCOUNTER — Other Ambulatory Visit: Payer: Medicare HMO

## 2020-05-01 ENCOUNTER — Telehealth: Payer: Self-pay | Admitting: *Deleted

## 2020-05-01 DIAGNOSIS — I25119 Atherosclerotic heart disease of native coronary artery with unspecified angina pectoris: Secondary | ICD-10-CM | POA: Diagnosis not present

## 2020-05-01 DIAGNOSIS — D689 Coagulation defect, unspecified: Secondary | ICD-10-CM | POA: Diagnosis not present

## 2020-05-01 DIAGNOSIS — I132 Hypertensive heart and chronic kidney disease with heart failure and with stage 5 chronic kidney disease, or end stage renal disease: Secondary | ICD-10-CM | POA: Diagnosis not present

## 2020-05-01 DIAGNOSIS — I509 Heart failure, unspecified: Secondary | ICD-10-CM | POA: Diagnosis not present

## 2020-05-01 DIAGNOSIS — G8929 Other chronic pain: Secondary | ICD-10-CM | POA: Diagnosis not present

## 2020-05-01 DIAGNOSIS — D631 Anemia in chronic kidney disease: Secondary | ICD-10-CM | POA: Diagnosis not present

## 2020-05-01 DIAGNOSIS — E1142 Type 2 diabetes mellitus with diabetic polyneuropathy: Secondary | ICD-10-CM | POA: Diagnosis not present

## 2020-05-01 DIAGNOSIS — N186 End stage renal disease: Secondary | ICD-10-CM | POA: Diagnosis not present

## 2020-05-01 DIAGNOSIS — E1122 Type 2 diabetes mellitus with diabetic chronic kidney disease: Secondary | ICD-10-CM | POA: Diagnosis not present

## 2020-05-01 DIAGNOSIS — E8779 Other fluid overload: Secondary | ICD-10-CM | POA: Diagnosis not present

## 2020-05-01 DIAGNOSIS — N2581 Secondary hyperparathyroidism of renal origin: Secondary | ICD-10-CM | POA: Diagnosis not present

## 2020-05-01 DIAGNOSIS — Z6841 Body Mass Index (BMI) 40.0 and over, adult: Secondary | ICD-10-CM | POA: Diagnosis not present

## 2020-05-01 DIAGNOSIS — Z992 Dependence on renal dialysis: Secondary | ICD-10-CM | POA: Diagnosis not present

## 2020-05-01 DIAGNOSIS — I120 Hypertensive chronic kidney disease with stage 5 chronic kidney disease or end stage renal disease: Secondary | ICD-10-CM | POA: Diagnosis not present

## 2020-05-01 DIAGNOSIS — E785 Hyperlipidemia, unspecified: Secondary | ICD-10-CM | POA: Diagnosis not present

## 2020-05-01 NOTE — Telephone Encounter (Signed)
Received notification of tier exception approval for Brilinta Good grom 01/20/2020-01/18/2021

## 2020-05-04 DIAGNOSIS — D689 Coagulation defect, unspecified: Secondary | ICD-10-CM | POA: Diagnosis not present

## 2020-05-04 DIAGNOSIS — E1122 Type 2 diabetes mellitus with diabetic chronic kidney disease: Secondary | ICD-10-CM | POA: Diagnosis not present

## 2020-05-04 DIAGNOSIS — Z992 Dependence on renal dialysis: Secondary | ICD-10-CM | POA: Diagnosis not present

## 2020-05-04 DIAGNOSIS — E8779 Other fluid overload: Secondary | ICD-10-CM | POA: Diagnosis not present

## 2020-05-04 DIAGNOSIS — I509 Heart failure, unspecified: Secondary | ICD-10-CM | POA: Diagnosis not present

## 2020-05-04 DIAGNOSIS — D631 Anemia in chronic kidney disease: Secondary | ICD-10-CM | POA: Diagnosis not present

## 2020-05-04 DIAGNOSIS — N186 End stage renal disease: Secondary | ICD-10-CM | POA: Diagnosis not present

## 2020-05-04 DIAGNOSIS — N2581 Secondary hyperparathyroidism of renal origin: Secondary | ICD-10-CM | POA: Diagnosis not present

## 2020-05-04 DIAGNOSIS — I132 Hypertensive heart and chronic kidney disease with heart failure and with stage 5 chronic kidney disease, or end stage renal disease: Secondary | ICD-10-CM | POA: Diagnosis not present

## 2020-05-06 ENCOUNTER — Telehealth: Payer: Self-pay | Admitting: Cardiovascular Disease

## 2020-05-06 NOTE — Telephone Encounter (Signed)
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 She is wanting to switch from DR.Cody to DR.Margaretann Loveless.

## 2020-05-07 DIAGNOSIS — I132 Hypertensive heart and chronic kidney disease with heart failure and with stage 5 chronic kidney disease, or end stage renal disease: Secondary | ICD-10-CM | POA: Diagnosis not present

## 2020-05-07 DIAGNOSIS — D631 Anemia in chronic kidney disease: Secondary | ICD-10-CM | POA: Diagnosis not present

## 2020-05-07 DIAGNOSIS — E8779 Other fluid overload: Secondary | ICD-10-CM | POA: Diagnosis not present

## 2020-05-07 DIAGNOSIS — N2581 Secondary hyperparathyroidism of renal origin: Secondary | ICD-10-CM | POA: Diagnosis not present

## 2020-05-07 DIAGNOSIS — I509 Heart failure, unspecified: Secondary | ICD-10-CM | POA: Diagnosis not present

## 2020-05-07 DIAGNOSIS — Z992 Dependence on renal dialysis: Secondary | ICD-10-CM | POA: Diagnosis not present

## 2020-05-07 DIAGNOSIS — D689 Coagulation defect, unspecified: Secondary | ICD-10-CM | POA: Diagnosis not present

## 2020-05-07 DIAGNOSIS — N186 End stage renal disease: Secondary | ICD-10-CM | POA: Diagnosis not present

## 2020-05-07 DIAGNOSIS — E1122 Type 2 diabetes mellitus with diabetic chronic kidney disease: Secondary | ICD-10-CM | POA: Diagnosis not present

## 2020-05-08 DIAGNOSIS — E8779 Other fluid overload: Secondary | ICD-10-CM | POA: Diagnosis not present

## 2020-05-08 DIAGNOSIS — D631 Anemia in chronic kidney disease: Secondary | ICD-10-CM | POA: Diagnosis not present

## 2020-05-08 DIAGNOSIS — Z992 Dependence on renal dialysis: Secondary | ICD-10-CM | POA: Diagnosis not present

## 2020-05-08 DIAGNOSIS — I132 Hypertensive heart and chronic kidney disease with heart failure and with stage 5 chronic kidney disease, or end stage renal disease: Secondary | ICD-10-CM | POA: Diagnosis not present

## 2020-05-08 DIAGNOSIS — E1122 Type 2 diabetes mellitus with diabetic chronic kidney disease: Secondary | ICD-10-CM | POA: Diagnosis not present

## 2020-05-08 DIAGNOSIS — I509 Heart failure, unspecified: Secondary | ICD-10-CM | POA: Diagnosis not present

## 2020-05-08 DIAGNOSIS — D689 Coagulation defect, unspecified: Secondary | ICD-10-CM | POA: Diagnosis not present

## 2020-05-08 DIAGNOSIS — N2581 Secondary hyperparathyroidism of renal origin: Secondary | ICD-10-CM | POA: Diagnosis not present

## 2020-05-08 DIAGNOSIS — N186 End stage renal disease: Secondary | ICD-10-CM | POA: Diagnosis not present

## 2020-05-10 DIAGNOSIS — E8779 Other fluid overload: Secondary | ICD-10-CM | POA: Diagnosis not present

## 2020-05-10 DIAGNOSIS — D631 Anemia in chronic kidney disease: Secondary | ICD-10-CM | POA: Diagnosis not present

## 2020-05-10 DIAGNOSIS — I509 Heart failure, unspecified: Secondary | ICD-10-CM | POA: Diagnosis not present

## 2020-05-10 DIAGNOSIS — N2581 Secondary hyperparathyroidism of renal origin: Secondary | ICD-10-CM | POA: Diagnosis not present

## 2020-05-10 DIAGNOSIS — D689 Coagulation defect, unspecified: Secondary | ICD-10-CM | POA: Diagnosis not present

## 2020-05-10 DIAGNOSIS — E1122 Type 2 diabetes mellitus with diabetic chronic kidney disease: Secondary | ICD-10-CM | POA: Diagnosis not present

## 2020-05-10 DIAGNOSIS — N186 End stage renal disease: Secondary | ICD-10-CM | POA: Diagnosis not present

## 2020-05-10 DIAGNOSIS — Z992 Dependence on renal dialysis: Secondary | ICD-10-CM | POA: Diagnosis not present

## 2020-05-10 DIAGNOSIS — I132 Hypertensive heart and chronic kidney disease with heart failure and with stage 5 chronic kidney disease, or end stage renal disease: Secondary | ICD-10-CM | POA: Diagnosis not present

## 2020-05-11 DIAGNOSIS — Z992 Dependence on renal dialysis: Secondary | ICD-10-CM | POA: Diagnosis not present

## 2020-05-11 DIAGNOSIS — D631 Anemia in chronic kidney disease: Secondary | ICD-10-CM | POA: Diagnosis not present

## 2020-05-11 DIAGNOSIS — E1122 Type 2 diabetes mellitus with diabetic chronic kidney disease: Secondary | ICD-10-CM | POA: Diagnosis not present

## 2020-05-11 DIAGNOSIS — N186 End stage renal disease: Secondary | ICD-10-CM | POA: Diagnosis not present

## 2020-05-11 DIAGNOSIS — I509 Heart failure, unspecified: Secondary | ICD-10-CM | POA: Diagnosis not present

## 2020-05-11 DIAGNOSIS — I132 Hypertensive heart and chronic kidney disease with heart failure and with stage 5 chronic kidney disease, or end stage renal disease: Secondary | ICD-10-CM | POA: Diagnosis not present

## 2020-05-11 DIAGNOSIS — E8779 Other fluid overload: Secondary | ICD-10-CM | POA: Diagnosis not present

## 2020-05-11 DIAGNOSIS — D689 Coagulation defect, unspecified: Secondary | ICD-10-CM | POA: Diagnosis not present

## 2020-05-11 DIAGNOSIS — N2581 Secondary hyperparathyroidism of renal origin: Secondary | ICD-10-CM | POA: Diagnosis not present

## 2020-05-14 DIAGNOSIS — N186 End stage renal disease: Secondary | ICD-10-CM | POA: Diagnosis not present

## 2020-05-14 DIAGNOSIS — Z992 Dependence on renal dialysis: Secondary | ICD-10-CM | POA: Diagnosis not present

## 2020-05-14 DIAGNOSIS — I132 Hypertensive heart and chronic kidney disease with heart failure and with stage 5 chronic kidney disease, or end stage renal disease: Secondary | ICD-10-CM | POA: Diagnosis not present

## 2020-05-14 DIAGNOSIS — E1122 Type 2 diabetes mellitus with diabetic chronic kidney disease: Secondary | ICD-10-CM | POA: Diagnosis not present

## 2020-05-14 DIAGNOSIS — I509 Heart failure, unspecified: Secondary | ICD-10-CM | POA: Diagnosis not present

## 2020-05-14 DIAGNOSIS — N2581 Secondary hyperparathyroidism of renal origin: Secondary | ICD-10-CM | POA: Diagnosis not present

## 2020-05-14 DIAGNOSIS — E8779 Other fluid overload: Secondary | ICD-10-CM | POA: Diagnosis not present

## 2020-05-14 DIAGNOSIS — D689 Coagulation defect, unspecified: Secondary | ICD-10-CM | POA: Diagnosis not present

## 2020-05-14 DIAGNOSIS — D631 Anemia in chronic kidney disease: Secondary | ICD-10-CM | POA: Diagnosis not present

## 2020-05-15 DIAGNOSIS — I871 Compression of vein: Secondary | ICD-10-CM | POA: Diagnosis not present

## 2020-05-15 DIAGNOSIS — T82858A Stenosis of vascular prosthetic devices, implants and grafts, initial encounter: Secondary | ICD-10-CM | POA: Diagnosis not present

## 2020-05-15 DIAGNOSIS — Z992 Dependence on renal dialysis: Secondary | ICD-10-CM | POA: Diagnosis not present

## 2020-05-15 DIAGNOSIS — N186 End stage renal disease: Secondary | ICD-10-CM | POA: Diagnosis not present

## 2020-05-16 DIAGNOSIS — Z992 Dependence on renal dialysis: Secondary | ICD-10-CM | POA: Diagnosis not present

## 2020-05-16 DIAGNOSIS — N186 End stage renal disease: Secondary | ICD-10-CM | POA: Diagnosis not present

## 2020-05-16 DIAGNOSIS — D631 Anemia in chronic kidney disease: Secondary | ICD-10-CM | POA: Diagnosis not present

## 2020-05-16 DIAGNOSIS — E1122 Type 2 diabetes mellitus with diabetic chronic kidney disease: Secondary | ICD-10-CM | POA: Diagnosis not present

## 2020-05-16 DIAGNOSIS — D689 Coagulation defect, unspecified: Secondary | ICD-10-CM | POA: Diagnosis not present

## 2020-05-16 DIAGNOSIS — E8779 Other fluid overload: Secondary | ICD-10-CM | POA: Diagnosis not present

## 2020-05-16 DIAGNOSIS — I509 Heart failure, unspecified: Secondary | ICD-10-CM | POA: Diagnosis not present

## 2020-05-16 DIAGNOSIS — I132 Hypertensive heart and chronic kidney disease with heart failure and with stage 5 chronic kidney disease, or end stage renal disease: Secondary | ICD-10-CM | POA: Diagnosis not present

## 2020-05-16 DIAGNOSIS — N2581 Secondary hyperparathyroidism of renal origin: Secondary | ICD-10-CM | POA: Diagnosis not present

## 2020-05-17 DIAGNOSIS — N186 End stage renal disease: Secondary | ICD-10-CM | POA: Diagnosis not present

## 2020-05-17 DIAGNOSIS — E1122 Type 2 diabetes mellitus with diabetic chronic kidney disease: Secondary | ICD-10-CM | POA: Diagnosis not present

## 2020-05-17 DIAGNOSIS — E8779 Other fluid overload: Secondary | ICD-10-CM | POA: Diagnosis not present

## 2020-05-17 DIAGNOSIS — D631 Anemia in chronic kidney disease: Secondary | ICD-10-CM | POA: Diagnosis not present

## 2020-05-17 DIAGNOSIS — Z992 Dependence on renal dialysis: Secondary | ICD-10-CM | POA: Diagnosis not present

## 2020-05-17 DIAGNOSIS — N2581 Secondary hyperparathyroidism of renal origin: Secondary | ICD-10-CM | POA: Diagnosis not present

## 2020-05-17 DIAGNOSIS — I132 Hypertensive heart and chronic kidney disease with heart failure and with stage 5 chronic kidney disease, or end stage renal disease: Secondary | ICD-10-CM | POA: Diagnosis not present

## 2020-05-17 DIAGNOSIS — D689 Coagulation defect, unspecified: Secondary | ICD-10-CM | POA: Diagnosis not present

## 2020-05-17 DIAGNOSIS — I509 Heart failure, unspecified: Secondary | ICD-10-CM | POA: Diagnosis not present

## 2020-05-18 DIAGNOSIS — Z992 Dependence on renal dialysis: Secondary | ICD-10-CM | POA: Diagnosis not present

## 2020-05-18 DIAGNOSIS — E1122 Type 2 diabetes mellitus with diabetic chronic kidney disease: Secondary | ICD-10-CM | POA: Diagnosis not present

## 2020-05-18 DIAGNOSIS — N186 End stage renal disease: Secondary | ICD-10-CM | POA: Diagnosis not present

## 2020-05-20 DIAGNOSIS — I132 Hypertensive heart and chronic kidney disease with heart failure and with stage 5 chronic kidney disease, or end stage renal disease: Secondary | ICD-10-CM | POA: Diagnosis not present

## 2020-05-20 DIAGNOSIS — D631 Anemia in chronic kidney disease: Secondary | ICD-10-CM | POA: Diagnosis not present

## 2020-05-20 DIAGNOSIS — Z992 Dependence on renal dialysis: Secondary | ICD-10-CM | POA: Diagnosis not present

## 2020-05-20 DIAGNOSIS — I509 Heart failure, unspecified: Secondary | ICD-10-CM | POA: Diagnosis not present

## 2020-05-20 DIAGNOSIS — D509 Iron deficiency anemia, unspecified: Secondary | ICD-10-CM | POA: Diagnosis not present

## 2020-05-20 DIAGNOSIS — N186 End stage renal disease: Secondary | ICD-10-CM | POA: Diagnosis not present

## 2020-05-20 DIAGNOSIS — E8779 Other fluid overload: Secondary | ICD-10-CM | POA: Diagnosis not present

## 2020-05-20 DIAGNOSIS — E1122 Type 2 diabetes mellitus with diabetic chronic kidney disease: Secondary | ICD-10-CM | POA: Diagnosis not present

## 2020-05-20 DIAGNOSIS — R82998 Other abnormal findings in urine: Secondary | ICD-10-CM | POA: Diagnosis not present

## 2020-05-23 DIAGNOSIS — R82998 Other abnormal findings in urine: Secondary | ICD-10-CM | POA: Diagnosis not present

## 2020-05-23 DIAGNOSIS — I509 Heart failure, unspecified: Secondary | ICD-10-CM | POA: Diagnosis not present

## 2020-05-23 DIAGNOSIS — E8779 Other fluid overload: Secondary | ICD-10-CM | POA: Diagnosis not present

## 2020-05-23 DIAGNOSIS — D631 Anemia in chronic kidney disease: Secondary | ICD-10-CM | POA: Diagnosis not present

## 2020-05-23 DIAGNOSIS — E1122 Type 2 diabetes mellitus with diabetic chronic kidney disease: Secondary | ICD-10-CM | POA: Diagnosis not present

## 2020-05-23 DIAGNOSIS — I132 Hypertensive heart and chronic kidney disease with heart failure and with stage 5 chronic kidney disease, or end stage renal disease: Secondary | ICD-10-CM | POA: Diagnosis not present

## 2020-05-23 DIAGNOSIS — N186 End stage renal disease: Secondary | ICD-10-CM | POA: Diagnosis not present

## 2020-05-23 DIAGNOSIS — Z992 Dependence on renal dialysis: Secondary | ICD-10-CM | POA: Diagnosis not present

## 2020-05-23 DIAGNOSIS — D509 Iron deficiency anemia, unspecified: Secondary | ICD-10-CM | POA: Diagnosis not present

## 2020-05-24 DIAGNOSIS — I132 Hypertensive heart and chronic kidney disease with heart failure and with stage 5 chronic kidney disease, or end stage renal disease: Secondary | ICD-10-CM | POA: Diagnosis not present

## 2020-05-24 DIAGNOSIS — I509 Heart failure, unspecified: Secondary | ICD-10-CM | POA: Diagnosis not present

## 2020-05-24 DIAGNOSIS — N186 End stage renal disease: Secondary | ICD-10-CM | POA: Diagnosis not present

## 2020-05-24 DIAGNOSIS — D509 Iron deficiency anemia, unspecified: Secondary | ICD-10-CM | POA: Diagnosis not present

## 2020-05-24 DIAGNOSIS — D631 Anemia in chronic kidney disease: Secondary | ICD-10-CM | POA: Diagnosis not present

## 2020-05-24 DIAGNOSIS — R82998 Other abnormal findings in urine: Secondary | ICD-10-CM | POA: Diagnosis not present

## 2020-05-24 DIAGNOSIS — E8779 Other fluid overload: Secondary | ICD-10-CM | POA: Diagnosis not present

## 2020-05-24 DIAGNOSIS — E1122 Type 2 diabetes mellitus with diabetic chronic kidney disease: Secondary | ICD-10-CM | POA: Diagnosis not present

## 2020-05-24 DIAGNOSIS — Z992 Dependence on renal dialysis: Secondary | ICD-10-CM | POA: Diagnosis not present

## 2020-05-28 ENCOUNTER — Telehealth: Payer: Self-pay | Admitting: Cardiovascular Disease

## 2020-05-28 DIAGNOSIS — D631 Anemia in chronic kidney disease: Secondary | ICD-10-CM | POA: Diagnosis not present

## 2020-05-28 DIAGNOSIS — N186 End stage renal disease: Secondary | ICD-10-CM | POA: Diagnosis not present

## 2020-05-28 DIAGNOSIS — E8779 Other fluid overload: Secondary | ICD-10-CM | POA: Diagnosis not present

## 2020-05-28 DIAGNOSIS — D509 Iron deficiency anemia, unspecified: Secondary | ICD-10-CM | POA: Diagnosis not present

## 2020-05-28 DIAGNOSIS — Z992 Dependence on renal dialysis: Secondary | ICD-10-CM | POA: Diagnosis not present

## 2020-05-28 DIAGNOSIS — I132 Hypertensive heart and chronic kidney disease with heart failure and with stage 5 chronic kidney disease, or end stage renal disease: Secondary | ICD-10-CM | POA: Diagnosis not present

## 2020-05-28 DIAGNOSIS — R82998 Other abnormal findings in urine: Secondary | ICD-10-CM | POA: Diagnosis not present

## 2020-05-28 DIAGNOSIS — E1122 Type 2 diabetes mellitus with diabetic chronic kidney disease: Secondary | ICD-10-CM | POA: Diagnosis not present

## 2020-05-28 DIAGNOSIS — I509 Heart failure, unspecified: Secondary | ICD-10-CM | POA: Diagnosis not present

## 2020-05-28 MED ORDER — TICAGRELOR 90 MG PO TABS: 90.0000 mg | ORAL_TABLET | Freq: Two times a day (BID) | ORAL | 0 refills | Status: AC

## 2020-05-28 NOTE — Telephone Encounter (Signed)
Spoke to patient . She states she has one sample bottle lot Brilinta left .   patient states she brought back to the office the patient assistance for Brilinta.  Samples were left at the front desk .   patient aware will defer to nurse- ( to see if  patient assistance for medication) or need to switch  medication

## 2020-05-28 NOTE — Telephone Encounter (Signed)
Pt states she will need some pharmacy assistance for Brilinta 90MG, pt states the price $141.00 and she can not afford to purchase

## 2020-05-29 DIAGNOSIS — I132 Hypertensive heart and chronic kidney disease with heart failure and with stage 5 chronic kidney disease, or end stage renal disease: Secondary | ICD-10-CM | POA: Diagnosis not present

## 2020-05-29 DIAGNOSIS — Z992 Dependence on renal dialysis: Secondary | ICD-10-CM | POA: Diagnosis not present

## 2020-05-29 DIAGNOSIS — R82998 Other abnormal findings in urine: Secondary | ICD-10-CM | POA: Diagnosis not present

## 2020-05-29 DIAGNOSIS — D631 Anemia in chronic kidney disease: Secondary | ICD-10-CM | POA: Diagnosis not present

## 2020-05-29 DIAGNOSIS — E8779 Other fluid overload: Secondary | ICD-10-CM | POA: Diagnosis not present

## 2020-05-29 DIAGNOSIS — E1122 Type 2 diabetes mellitus with diabetic chronic kidney disease: Secondary | ICD-10-CM | POA: Diagnosis not present

## 2020-05-29 DIAGNOSIS — D509 Iron deficiency anemia, unspecified: Secondary | ICD-10-CM | POA: Diagnosis not present

## 2020-05-29 DIAGNOSIS — N186 End stage renal disease: Secondary | ICD-10-CM | POA: Diagnosis not present

## 2020-05-29 DIAGNOSIS — I509 Heart failure, unspecified: Secondary | ICD-10-CM | POA: Diagnosis not present

## 2020-05-31 DIAGNOSIS — N186 End stage renal disease: Secondary | ICD-10-CM | POA: Diagnosis not present

## 2020-05-31 DIAGNOSIS — Z992 Dependence on renal dialysis: Secondary | ICD-10-CM | POA: Diagnosis not present

## 2020-05-31 DIAGNOSIS — E1122 Type 2 diabetes mellitus with diabetic chronic kidney disease: Secondary | ICD-10-CM | POA: Diagnosis not present

## 2020-05-31 DIAGNOSIS — I509 Heart failure, unspecified: Secondary | ICD-10-CM | POA: Diagnosis not present

## 2020-05-31 DIAGNOSIS — I132 Hypertensive heart and chronic kidney disease with heart failure and with stage 5 chronic kidney disease, or end stage renal disease: Secondary | ICD-10-CM | POA: Diagnosis not present

## 2020-05-31 DIAGNOSIS — E8779 Other fluid overload: Secondary | ICD-10-CM | POA: Diagnosis not present

## 2020-05-31 DIAGNOSIS — R82998 Other abnormal findings in urine: Secondary | ICD-10-CM | POA: Diagnosis not present

## 2020-05-31 DIAGNOSIS — D631 Anemia in chronic kidney disease: Secondary | ICD-10-CM | POA: Diagnosis not present

## 2020-05-31 DIAGNOSIS — D509 Iron deficiency anemia, unspecified: Secondary | ICD-10-CM | POA: Diagnosis not present

## 2020-05-31 NOTE — Telephone Encounter (Signed)
Spoke with [employer], tried calling prior to today and was unable to talk to anyone or after hours They are missing SS or Medicare number (red, white & blue card) Called patient and advised, she will call them directly to give information

## 2020-06-03 DIAGNOSIS — E8779 Other fluid overload: Secondary | ICD-10-CM | POA: Diagnosis not present

## 2020-06-03 DIAGNOSIS — D631 Anemia in chronic kidney disease: Secondary | ICD-10-CM | POA: Diagnosis not present

## 2020-06-03 DIAGNOSIS — R82998 Other abnormal findings in urine: Secondary | ICD-10-CM | POA: Diagnosis not present

## 2020-06-03 DIAGNOSIS — I509 Heart failure, unspecified: Secondary | ICD-10-CM | POA: Diagnosis not present

## 2020-06-03 DIAGNOSIS — I132 Hypertensive heart and chronic kidney disease with heart failure and with stage 5 chronic kidney disease, or end stage renal disease: Secondary | ICD-10-CM | POA: Diagnosis not present

## 2020-06-03 DIAGNOSIS — D509 Iron deficiency anemia, unspecified: Secondary | ICD-10-CM | POA: Diagnosis not present

## 2020-06-03 DIAGNOSIS — N186 End stage renal disease: Secondary | ICD-10-CM | POA: Diagnosis not present

## 2020-06-03 DIAGNOSIS — E1122 Type 2 diabetes mellitus with diabetic chronic kidney disease: Secondary | ICD-10-CM | POA: Diagnosis not present

## 2020-06-03 DIAGNOSIS — Z992 Dependence on renal dialysis: Secondary | ICD-10-CM | POA: Diagnosis not present

## 2020-06-04 DIAGNOSIS — Z992 Dependence on renal dialysis: Secondary | ICD-10-CM | POA: Diagnosis not present

## 2020-06-04 DIAGNOSIS — R82998 Other abnormal findings in urine: Secondary | ICD-10-CM | POA: Diagnosis not present

## 2020-06-04 DIAGNOSIS — I132 Hypertensive heart and chronic kidney disease with heart failure and with stage 5 chronic kidney disease, or end stage renal disease: Secondary | ICD-10-CM | POA: Diagnosis not present

## 2020-06-04 DIAGNOSIS — I509 Heart failure, unspecified: Secondary | ICD-10-CM | POA: Diagnosis not present

## 2020-06-04 DIAGNOSIS — D631 Anemia in chronic kidney disease: Secondary | ICD-10-CM | POA: Diagnosis not present

## 2020-06-04 DIAGNOSIS — N186 End stage renal disease: Secondary | ICD-10-CM | POA: Diagnosis not present

## 2020-06-04 DIAGNOSIS — E8779 Other fluid overload: Secondary | ICD-10-CM | POA: Diagnosis not present

## 2020-06-04 DIAGNOSIS — D509 Iron deficiency anemia, unspecified: Secondary | ICD-10-CM | POA: Diagnosis not present

## 2020-06-04 DIAGNOSIS — E1122 Type 2 diabetes mellitus with diabetic chronic kidney disease: Secondary | ICD-10-CM | POA: Diagnosis not present

## 2020-06-09 DIAGNOSIS — E1122 Type 2 diabetes mellitus with diabetic chronic kidney disease: Secondary | ICD-10-CM | POA: Diagnosis not present

## 2020-06-09 DIAGNOSIS — D509 Iron deficiency anemia, unspecified: Secondary | ICD-10-CM | POA: Diagnosis not present

## 2020-06-09 DIAGNOSIS — I132 Hypertensive heart and chronic kidney disease with heart failure and with stage 5 chronic kidney disease, or end stage renal disease: Secondary | ICD-10-CM | POA: Diagnosis not present

## 2020-06-09 DIAGNOSIS — I509 Heart failure, unspecified: Secondary | ICD-10-CM | POA: Diagnosis not present

## 2020-06-09 DIAGNOSIS — R82998 Other abnormal findings in urine: Secondary | ICD-10-CM | POA: Diagnosis not present

## 2020-06-09 DIAGNOSIS — Z992 Dependence on renal dialysis: Secondary | ICD-10-CM | POA: Diagnosis not present

## 2020-06-09 DIAGNOSIS — E8779 Other fluid overload: Secondary | ICD-10-CM | POA: Diagnosis not present

## 2020-06-09 DIAGNOSIS — N186 End stage renal disease: Secondary | ICD-10-CM | POA: Diagnosis not present

## 2020-06-09 DIAGNOSIS — D631 Anemia in chronic kidney disease: Secondary | ICD-10-CM | POA: Diagnosis not present

## 2020-06-10 ENCOUNTER — Ambulatory Visit: Payer: Medicare HMO | Admitting: Internal Medicine

## 2020-06-10 DIAGNOSIS — Z992 Dependence on renal dialysis: Secondary | ICD-10-CM | POA: Diagnosis not present

## 2020-06-10 DIAGNOSIS — I132 Hypertensive heart and chronic kidney disease with heart failure and with stage 5 chronic kidney disease, or end stage renal disease: Secondary | ICD-10-CM | POA: Diagnosis not present

## 2020-06-10 DIAGNOSIS — D631 Anemia in chronic kidney disease: Secondary | ICD-10-CM | POA: Diagnosis not present

## 2020-06-10 DIAGNOSIS — E8779 Other fluid overload: Secondary | ICD-10-CM | POA: Diagnosis not present

## 2020-06-10 DIAGNOSIS — I509 Heart failure, unspecified: Secondary | ICD-10-CM | POA: Diagnosis not present

## 2020-06-10 DIAGNOSIS — D509 Iron deficiency anemia, unspecified: Secondary | ICD-10-CM | POA: Diagnosis not present

## 2020-06-10 DIAGNOSIS — R82998 Other abnormal findings in urine: Secondary | ICD-10-CM | POA: Diagnosis not present

## 2020-06-10 DIAGNOSIS — E1122 Type 2 diabetes mellitus with diabetic chronic kidney disease: Secondary | ICD-10-CM | POA: Diagnosis not present

## 2020-06-10 DIAGNOSIS — N186 End stage renal disease: Secondary | ICD-10-CM | POA: Diagnosis not present

## 2020-06-14 ENCOUNTER — Other Ambulatory Visit (HOSPITAL_COMMUNITY): Payer: Medicare HMO

## 2020-06-18 ENCOUNTER — Ambulatory Visit: Payer: Medicare HMO | Admitting: Internal Medicine

## 2020-06-18 DIAGNOSIS — Z992 Dependence on renal dialysis: Secondary | ICD-10-CM | POA: Diagnosis not present

## 2020-06-18 DIAGNOSIS — N186 End stage renal disease: Secondary | ICD-10-CM | POA: Diagnosis not present

## 2020-06-18 DIAGNOSIS — E1122 Type 2 diabetes mellitus with diabetic chronic kidney disease: Secondary | ICD-10-CM | POA: Diagnosis not present

## 2020-06-19 DIAGNOSIS — D689 Coagulation defect, unspecified: Secondary | ICD-10-CM | POA: Diagnosis not present

## 2020-06-19 DIAGNOSIS — N186 End stage renal disease: Secondary | ICD-10-CM | POA: Diagnosis not present

## 2020-06-19 DIAGNOSIS — N2581 Secondary hyperparathyroidism of renal origin: Secondary | ICD-10-CM | POA: Diagnosis not present

## 2020-06-19 DIAGNOSIS — Z992 Dependence on renal dialysis: Secondary | ICD-10-CM | POA: Diagnosis not present

## 2020-06-20 ENCOUNTER — Encounter: Payer: Self-pay | Admitting: Internal Medicine

## 2020-06-20 NOTE — Progress Notes (Signed)
Virtual Visit via Video Note  I connected with Brenda Davis on 06/21/20 at  8:30 AM EDT by a video enabled telemedicine application and verified that I am speaking with the correct person using two identifiers.   I discussed the limitations of evaluation and management by telemedicine and the availability of in person appointments. The patient expressed understanding and agreed to proceed.  Present for the visit:  Myself, Dr Billey Gosling, Diana Eves.  The patient is currently at home and I am in the office.    No referring provider.    History of Present Illness: She is here for an acute visit for cold symptoms.   Her symptoms started 2 days ago - Wednesday night, today is day 3 of symptoms  She is experiencing mildly dec appetite, sore throat, runny nose and cough.  She has had a little nausea.  She denies fever, SOB, headaches.    She has tried taking coricidin.  She does home PD.    She had covid in 02/2020 and once prior.  She has not tested herself for covid.      Review of Systems  Constitutional: Negative for chills and fever.       Dec appetite  HENT: Positive for sore throat. Negative for congestion, ear pain and sinus pain.        Runny nose, No PND  Respiratory: Positive for cough. Negative for sputum production, shortness of breath and wheezing.   Cardiovascular: Positive for palpitations (chronic). Negative for chest pain.  Gastrointestinal: Positive for nausea. Negative for diarrhea.  Musculoskeletal: Negative for myalgias.  Neurological: Negative for dizziness and headaches.      Social History   Socioeconomic History  . Marital status: Married    Spouse name: Not on file  . Number of children: 3  . Years of education: Not on file  . Highest education level: Not on file  Occupational History  . Occupation: Research scientist (physical sciences): [employer]  Tobacco Use  . Smoking status: Former Smoker    Packs/day: 1.00    Years: 16.00     Pack years: 16.00    Types: Cigarettes    Quit date: 01/19/2001    Years since quitting: 19.4  . Smokeless tobacco: Never Used  Vaping Use  . Vaping Use: Never used  Substance and Sexual Activity  . Alcohol use: Never  . Drug use: No  . Sexual activity: Yes    Birth control/protection: Post-menopausal  Other Topics Concern  . Not on file  Social History Narrative  . Not on file   Social Determinants of Health   Financial Resource Strain: Not on file  Food Insecurity: Not on file  Transportation Needs: Not on file  Physical Activity: Not on file  Stress: Not on file  Social Connections: Not on file     Observations/Objective: Appears well in NAD Breathing normally, occ dry cough Skin appears warm and dry  Assessment and Plan:  URI: Acute Symptoms likely viral in nature Has had covid x 2 in the past - advised getting tested - she will update [place] if positive No need for an antibiotic at this time Continue symptomatic treatment with over-the-counter cold medications, Tylenol/ibuprofen Increase rest and fluids Call if symptoms worsen or do not improve   Follow Up Instructions:    I discussed the assessment and treatment plan with the patient. The patient was provided an opportunity to ask questions and all were answered. The patient agreed with the  plan and demonstrated an understanding of the instructions.   The patient was advised to call back or seek an in-person evaluation if the symptoms worsen or if the condition fails to improve as anticipated.    Binnie Rail, MD

## 2020-06-21 ENCOUNTER — Telehealth (INDEPENDENT_AMBULATORY_CARE_PROVIDER_SITE_OTHER): Payer: Medicare HMO | Admitting: Internal Medicine

## 2020-06-21 DIAGNOSIS — N186 End stage renal disease: Secondary | ICD-10-CM | POA: Diagnosis not present

## 2020-06-21 DIAGNOSIS — D689 Coagulation defect, unspecified: Secondary | ICD-10-CM | POA: Diagnosis not present

## 2020-06-21 DIAGNOSIS — Z992 Dependence on renal dialysis: Secondary | ICD-10-CM | POA: Diagnosis not present

## 2020-06-21 DIAGNOSIS — J069 Acute upper respiratory infection, unspecified: Secondary | ICD-10-CM

## 2020-06-21 DIAGNOSIS — N2581 Secondary hyperparathyroidism of renal origin: Secondary | ICD-10-CM | POA: Diagnosis not present

## 2020-06-21 NOTE — Telephone Encounter (Signed)
Received notification approved from 05/31/2020-01/18/2021 Patient aware and received yesterday

## 2020-06-24 ENCOUNTER — Encounter (HOSPITAL_BASED_OUTPATIENT_CLINIC_OR_DEPARTMENT_OTHER): Payer: Medicare HMO | Admitting: Pulmonary Disease

## 2020-06-25 ENCOUNTER — Encounter: Payer: Self-pay | Admitting: Physician Assistant

## 2020-06-25 ENCOUNTER — Telehealth: Payer: Self-pay | Admitting: Hematology and Oncology

## 2020-06-25 ENCOUNTER — Telehealth: Payer: Self-pay

## 2020-06-25 ENCOUNTER — Ambulatory Visit (INDEPENDENT_AMBULATORY_CARE_PROVIDER_SITE_OTHER): Payer: Medicare HMO | Admitting: Physician Assistant

## 2020-06-25 VITALS — BP 122/64 | HR 60 | Ht 65.0 in | Wt 274.0 lb

## 2020-06-25 DIAGNOSIS — I2510 Atherosclerotic heart disease of native coronary artery without angina pectoris: Secondary | ICD-10-CM | POA: Diagnosis not present

## 2020-06-25 DIAGNOSIS — D649 Anemia, unspecified: Secondary | ICD-10-CM

## 2020-06-25 DIAGNOSIS — Z9884 Bariatric surgery status: Secondary | ICD-10-CM | POA: Diagnosis not present

## 2020-06-25 NOTE — Telephone Encounter (Signed)
Scheduled appt per 5/23 referral. Pt aware.

## 2020-06-25 NOTE — Telephone Encounter (Signed)
[facility] Pre-operative Risk Assessment     Request for surgical clearance:     Endoscopy Procedure  What type of surgery is being performed?     Endoscopy/colonoscopy  When is this surgery scheduled?     07/28/2020  What type of clearance is required ?   Pharmacy  Are there any medications that need to be held prior to surgery and how long? Brillinta - 5 days  Practice name and name of physician performing surgery?      [facility]  What is your office phone and fax number?      Phone- [phone]  [phone]  Anesthesia type (None, local, MAC, general) ?       MAC

## 2020-06-25 NOTE — Progress Notes (Signed)
Chief Complaint: Blood in stool, anemia  HPI:    Mrs. Brenda Davis is a 55 year old female, with a past medical history of CAD on Brilinta (03/03/2019 cardiac cath, 01/16/2019 echo with an LVEF 65-70% and mild aortic valve sclerosis without stenosis), ESRD on dialysis and multiple others, who was referred to me by [name], MD for a complaint of anemia and blood in stool.      02/28/2020 CBC with a hemoglobin of 8.1, MCV elevated 98.    Today, the patient tells me that her hemoglobin has always been a little bit low, but dropped down to the eights from January to April and she was started on an iron infusion as well as given Venofer to help with this.  Tells me that in May she had a repeat hemoglobin and it was around 10.  At that time her nephrologist told her that she needed to have GI work-up to see what was happening.  She explains she is also due for colonoscopy anyways because "I have never had one".  Tells me the only GI issue she is having right now is that she has been constipated over the past 3 days.  She did have a successful bowel movement yesterday after drinking "ballerina tea".    Does tell me she is on dialysis but does this at home and is able to have a flexible schedule.  Also on Brilinta after stent placement last in February 2021.  She has never had to hold this before.    Denies fever, chills, weight loss or blood in her stool.  Past Medical History:  Diagnosis Date  . Anemia   . CAD (coronary artery disease)    a. cath in 02/2018 showing moderate 3-vessel CAD with 45% mid-LADm 55% LCx and 65% RCA stenosis which was not significant by FFR  . ESRD on dialysis ([place])   . Gout   . HCAP (healthcare-associated pneumonia) 05/06/2016  . History of blood transfusion    "related to surgery"  . Hyperlipidemia   . Hypertension   . Morbid obesity ([place])   . Pain    LEFT SHOULDER PAIN - WAS SEEN AT AN URGENT CARE - GIVEN SLING FOR COMFORT AND TOLD ROM AS TOLERATED.  Marland Kitchen  Palpitations 09/24/2016  . Peritonitis, dialysis-associated ([place])   . SVT (supraventricular tachycardia) ([place]) 02/28/2020  . Type II diabetes mellitus ([place])    "gastric sleeve OR corrected this" (05/06/2016)    Past Surgical History:  Procedure Laterality Date  . AV FISTULA PLACEMENT Left 01/03/2015   Procedure: BRACHIAL CEPHALIC ARTERIOVENOUS  FISTULA CREATION LEFT ARM;  Surgeon: Angelia Mould, MD;  Location: [facility];  Service: Vascular;  Laterality: Left;  . CARPAL TUNNEL RELEASE Right   . [place]  . CHOLECYSTECTOMY  09/14/2017  . CORONARY STENT INTERVENTION N/A 01/17/2019   Procedure: CORONARY STENT INTERVENTION;  Surgeon: Jettie Booze, MD;  Location: [facility];  Service: Cardiovascular;  Laterality: N/A;  . ESOPHAGOGASTRODUODENOSCOPY N/A 09/04/2012   Procedure: ESOPHAGOGASTRODUODENOSCOPY (EGD);  Surgeon: Shann Medal, MD;  Location: [facility];  Service: General;  Laterality: N/A;  PF  . ESOPHAGOGASTRODUODENOSCOPY (EGD) WITH ESOPHAGEAL DILATION N/A 09/29/2012   Procedure: ESOPHAGOGASTRODUODENOSCOPY (EGD) WITH ESOPHAGEAL DILATION;  Surgeon: Milus Banister, MD;  [facility]: [facility];  Service: Endoscopy;  Laterality: N/A;  . FEMORAL ARTERY EXPLORATION Right 01/17/2019   Procedure: Exploration Right Groin with Primary Closure or Arteriotomy Site; Washout of Retroperitoneal Hematoma;  Surgeon: Servando Snare  Harrell Gave, MD;  Location: [facility];  Service: Vascular;  Laterality: Right;  . INSERTION OF DIALYSIS CATHETER N/A 01/03/2015   Procedure: INSERTION OF DIALYSIS CATHETER RIGHT INTERNAL JUGULAR;  Surgeon: Angelia Mould, MD;  Location: [facility];  Service: Vascular;  Laterality: N/A;  . LAPAROSCOPIC GASTRIC SLEEVE RESECTION N/A 07/19/2012   Procedure: LAPAROSCOPIC SLEEVE GASTRECTOMY with EGD;  Surgeon: Madilyn Hook, DO;  [facility]: [facility];  Service: General;  Laterality: N/A;  laparoscopic sleeve gastrectomy with EGD  . LEFT HEART CATH AND  CORONARY ANGIOGRAPHY N/A 02/25/2018   Procedure: LEFT HEART CATH AND CORONARY ANGIOGRAPHY;  Surgeon: Leonie Man, MD;  Location: [facility];  Service: Cardiovascular;  Laterality: N/A;  . LEFT HEART CATH AND CORONARY ANGIOGRAPHY N/A 06/20/2018   Procedure: LEFT HEART CATH AND CORONARY ANGIOGRAPHY;  Surgeon: Nelva Bush, MD;  Location: [facility];  Service: Cardiovascular;  Laterality: N/A;  . LEFT HEART CATH AND CORONARY ANGIOGRAPHY N/A 01/16/2019   Procedure: LEFT HEART CATH AND CORONARY ANGIOGRAPHY;  Surgeon: Jettie Booze, MD;  Location: [facility];  Service: Cardiovascular;  Laterality: N/A;  . LEFT HEART CATH AND CORONARY ANGIOGRAPHY N/A 03/03/2019   Procedure: LEFT HEART CATH AND CORONARY ANGIOGRAPHY;  Surgeon: Jettie Booze, MD;  Location: [facility];  Service: Cardiovascular;  Laterality: N/A;  . PERIPHERAL VASCULAR BALLOON ANGIOPLASTY Left 08/18/2019   Procedure: PERIPHERAL VASCULAR BALLOON ANGIOPLASTY;  Surgeon: Marty Heck, MD;  Location: [facility];  Service: Cardiovascular;  Laterality: Left;  arm fistula  . PERIPHERAL VASCULAR CATHETERIZATION Left 05/23/2015   Procedure: Nolon Stalls;  Surgeon: Conrad Braceville, MD;  Location: [facility];  Service: Cardiovascular;  Laterality: Left;  upper aRM  . PERIPHERAL VASCULAR CATHETERIZATION Left 05/23/2015   Procedure: Peripheral Vascular Balloon Angioplasty;  Surgeon: Conrad Republic, MD;  Location: [facility];  Service: Cardiovascular;  Laterality: Left;  av fistula  . TUBAL LIGATION  1993  . UPPER GI ENDOSCOPY N/A 07/19/2012   Procedure: UPPER GI ENDOSCOPY;  Surgeon: Madilyn Hook, DO;  [facility];  Service: General;  Laterality: N/A;    Current Outpatient Medications  Medication Sig Dispense Refill  . acetaminophen (TYLENOL) 500 MG tablet Take 1,000 mg by mouth every 6 (six) hours as needed for moderate pain or headache.    . B Complex-C-Biotin-D-FA (DIALYVITE  800 PLUS D) 800 MCG WAFR Take by mouth.    . calcitRIOL (ROCALTROL) 0.5 MCG capsule Take 1.5 mcg by mouth daily.     . calcium acetate (PHOSLO) 667 MG capsule Take 667-2,001 mg by mouth See admin instructions. Take 2001 mg with each meal and 667 mg with each snack    . carvedilol (COREG) 25 MG tablet Take 1 tablet (25 mg total) by mouth 2 (two) times daily. 180 tablet 3  . cinacalcet (SENSIPAR) 30 MG tablet TAKE 2 TABLETS BY MOUTH ON MON, WED & FRI AT BEDTIME    . ferric citrate (AURYXIA) 1 GM 210 MG(Fe) tablet Take 2 tablets (420 mg total) by mouth 3 (three) times daily with meals. 270 tablet 0  . gabapentin (NEURONTIN) 100 MG capsule Take 100 mg by mouth 2 (two) times daily.    . iron sucrose in sodium chloride 0.9 % 100 mL Iron Sucrose (Venofer)    . isosorbide mononitrate (IMDUR) 30 MG 24 hr tablet Take 1 tablet (30 mg total) by mouth daily. 30 tablet 6  . mupirocin ointment (BACTROBAN) 2 %  Apply topically.    . nitroGLYCERIN (NITROSTAT) 0.4 MG SL tablet TAKE 1 TABLET BY MOUTH UNDER THE TONGUE EVERY 5 MINUTES AS NEEDED FOR CHEST PAIN (Patient taking differently: Place 0.4 mg under the tongue every 5 (five) minutes as needed for chest pain.) 50 tablet 2  . ondansetron (ZOFRAN) 4 MG tablet Take 1 tablet (4 mg total) by mouth every 8 (eight) hours as needed for nausea or vomiting. 20 tablet 0  . rosuvastatin (CRESTOR) 20 MG tablet Take 1 tablet (20 mg total) by mouth daily. 90 tablet 3  . thiamine (VITAMIN B-1) 50 MG tablet Take 1 tablet (50 mg total) by mouth daily. 90 tablet 1  . ticagrelor (BRILINTA) 90 MG TABS tablet Take 1 tablet (90 mg total) by mouth 2 (two) times daily. 32 tablet 0  . diltiazem (CARDIZEM CD) 120 MG 24 hr capsule Take 1 capsule (120 mg total) by mouth daily. 90 capsule 3   No current facility-administered medications for this visit.    Allergies as of 06/25/2020 - Review Complete 06/25/2020  Allergen Reaction Noted  . Amlodipine Swelling 12/07/2011  . Atorvastatin   12/12/2018  . Clonidine derivatives Swelling 03/22/2012  . Doxycycline Nausea And Vomiting 03/27/2018  . Welchol [colesevelam hcl] Nausea Only 06/28/2012    Family History  Problem Relation Age of Onset  . Hypertension Mother   . Mitral valve prolapse Mother   . Diabetes Father   . Arthritis Other   . Hyperlipidemia Other   . Cancer Neg Hx   . Heart disease Neg Hx   . Kidney disease Neg Hx   . Stroke Neg Hx     Social History   Socioeconomic History  . Marital status: Married    Spouse name: Not on file  . Number of children: 3  . Years of education: Not on file  . Highest education level: Not on file  Occupational History  . Occupation: Research scientist (physical sciences): [employer]  Tobacco Use  . Smoking status: Former Smoker    Packs/day: 1.00    Years: 16.00    Pack years: 16.00    Types: Cigarettes    Quit date: 01/19/2001    Years since quitting: 19.4  . Smokeless tobacco: Never Used  Vaping Use  . Vaping Use: Never used  Substance and Sexual Activity  . Alcohol use: Never  . Drug use: No  . Sexual activity: Yes    Birth control/protection: Post-menopausal  Other Topics Concern  . Not on file  Social History Narrative  . Not on file   Social Determinants of Health   Financial Resource Strain: Not on file  Food Insecurity: Not on file  Transportation Needs: Not on file  Physical Activity: Not on file  Stress: Not on file  Social Connections: Not on file  Intimate Partner Violence: Not on file    Review of Systems:    Constitutional: No weight loss, fever or chills Skin: No rash  Cardiovascular: No chest pain  Respiratory: No SOB  Gastrointestinal: See HPI and otherwise negative Genitourinary: No dysuria  Neurological: No headache, dizziness or syncope Musculoskeletal: No new muscle or joint pain Hematologic: No bleeding  Psychiatric: No history of depression or anxiety   Physical Exam:  Vital signs: BP 122/64   Pulse 60   Ht 5' 5"  (1.651 m)   Wt  274 lb (124.3 kg)   BMI 45.60 kg/m   Constitutional:   Pleasant Caucasian female appears to be in NAD, Well  developed, Well nourished, alert and cooperative Head:  Normocephalic and atraumatic. Eyes:   PEERL, EOMI. No icterus. Conjunctiva pink. Ears:  Normal auditory acuity. Neck:  Supple Throat: Oral cavity and pharynx without inflammation, swelling or lesion.  Respiratory: Respirations even and unlabored. Lungs clear to auscultation bilaterally.   No wheezes, crackles, or rhonchi.  Cardiovascular: Normal S1, S2. No MRG. Regular rate and rhythm. No peripheral edema, cyanosis or pallor.  Gastrointestinal:  Soft, nondistended, nontender. No rebound or guarding. Normal bowel sounds. No appreciable masses or hepatomegaly. Rectal:  Not performed.  Msk:  Symmetrical without gross deformities. Without edema, no deformity or joint abnormality.  Neurologic:  Alert and  oriented x4;  grossly normal neurologically.  Skin:   Dry and intact without significant lesions or rashes. Psychiatric: Demonstrates good judgement and reason without abnormal affect or behaviors.  RELEVANT LABS AND IMAGING: CBC    Component Value Date/Time   WBC 3.8 02/28/2020 1047   WBC 5.8 02/23/2020 0931   RBC 2.55 (LL) 02/28/2020 1047   RBC 2.64 (L) 02/23/2020 0931   HGB 8.1 (L) 02/28/2020 1047   HCT 25.0 (L) 02/28/2020 1047   PLT 218 02/28/2020 1047   MCV 98 (H) 02/28/2020 1047   MCH 31.8 02/28/2020 1047   MCH 32.3 02/12/2020 1416   MCHC 32.4 02/28/2020 1047   MCHC 33.6 02/23/2020 0931   RDW 15.0 02/28/2020 1047   LYMPHSABS 1.1 02/28/2020 1047   MONOABS 0.9 02/23/2020 0931   EOSABS 0.0 02/28/2020 1047   BASOSABS 0.0 02/28/2020 1047    CMP     Component Value Date/Time   NA 139 02/28/2020 1047   K 5.6 (H) 02/28/2020 1047   CL 95 (L) 02/28/2020 1047   CO2 26 02/28/2020 1047   GLUCOSE 88 02/28/2020 1047   GLUCOSE 78 02/12/2020 1416   BUN 29 (H) 02/28/2020 1047   CREATININE 7.67 (H) 02/28/2020 1047    CREATININE 2.28 (H) 03/21/2012 0952   CALCIUM 9.0 02/28/2020 1047   PROT 6.5 02/28/2020 1047   ALBUMIN 3.6 (L) 02/28/2020 1047   AST 17 02/28/2020 1047   ALT 17 02/28/2020 1047   ALKPHOS 85 02/28/2020 1047   BILITOT 0.5 02/28/2020 1047   GFRNONAA 5 (L) 02/28/2020 1047   GFRNONAA 4 (L) 02/12/2020 1416   GFRAA 6 (L) 02/28/2020 1047    Assessment: 1.  Anemia: Hemoglobin dropped down 2 points it sounds like for about 4 months before the patient started getting iron infusions, now improved to around the 10's (we are requesting records; likely element of acute on chronic anemia given her ESRD 2.  ESRD on dialysis 3.  Chronic anticoagulation with Brilinta status post stenting  Plan: 1.  Patient scheduled for diagnostic EGD and colonoscopy given anemia.  She was scheduled with Dr. Bryan Lemma as he had sooner availability than Dr. Ardis Hughs.  Did provide the patient a detailed list of risks for the procedures and she agrees to proceed. 2.  Patient was advised to hold her for Brilinta for 5 days prior to time procedures.  We will communicate with her prescribing physician to ensure this is acceptable for her. 3.  Discussed MiraLAX as needed for constipation that may be ongoing 4.  Patient to follow in clinic per recommendations from Dr. Bryan Lemma after time of procedures.  Ellouise Newer, PA-C [facility] 06/25/2020, 9:00 AM  Cc: [name], MD

## 2020-06-25 NOTE — Progress Notes (Signed)
I agree with the above note, plan 

## 2020-06-25 NOTE — Telephone Encounter (Signed)
Dr. Oval Linsey  This patient is to undergo an endoscopy 07/25/2020. Requesting team asking to hold Brilinta 5 days prior however the patient has a hx of in--stent restenosis per [facility] 02/2019 with recs at that time to continue uninterrupted DAPT for 12 months. She is out of that window however given her CAD hx, wanted to gain your opinion moving forward with her anti-platelet therapy   Please send recommendations to the pre-op pool   Thank you

## 2020-06-25 NOTE — Patient Instructions (Signed)
If you are age 56 or older, your body mass index should be between 23-30. Your Body mass index is 45.6 kg/m. If this is out of the aforementioned range listed, please consider follow up with your Primary Care Provider.  If you are age 26 or younger, your body mass index should be between 19-25. Your Body mass index is 45.6 kg/m. If this is out of the aformentioned range listed, please consider follow up with your Primary Care Provider.   __________________________________________________________  The [facility] providers would like to encourage you to use [facility] to communicate with providers for non-urgent requests or questions.  Due to long hold times on the telephone, sending your provider a message by [facility] may be a faster and more efficient way to get a response.  Please allow 48 business hours for a response.  Please remember that this is for non-urgent requests.   You have been scheduled for a colonoscopy. Please follow written instructions given to you at your visit today.  Please pick up your prep supplies at the pharmacy within the next 1-3 days. If you use inhalers (even only as needed), please bring them with you on the day of your procedure.

## 2020-06-27 DIAGNOSIS — Z992 Dependence on renal dialysis: Secondary | ICD-10-CM | POA: Diagnosis not present

## 2020-06-27 DIAGNOSIS — D509 Iron deficiency anemia, unspecified: Secondary | ICD-10-CM | POA: Diagnosis not present

## 2020-06-27 DIAGNOSIS — D689 Coagulation defect, unspecified: Secondary | ICD-10-CM | POA: Diagnosis not present

## 2020-06-27 DIAGNOSIS — E8779 Other fluid overload: Secondary | ICD-10-CM | POA: Diagnosis not present

## 2020-06-27 DIAGNOSIS — I509 Heart failure, unspecified: Secondary | ICD-10-CM | POA: Diagnosis not present

## 2020-06-27 DIAGNOSIS — E1122 Type 2 diabetes mellitus with diabetic chronic kidney disease: Secondary | ICD-10-CM | POA: Diagnosis not present

## 2020-06-27 DIAGNOSIS — N186 End stage renal disease: Secondary | ICD-10-CM | POA: Diagnosis not present

## 2020-06-27 DIAGNOSIS — I132 Hypertensive heart and chronic kidney disease with heart failure and with stage 5 chronic kidney disease, or end stage renal disease: Secondary | ICD-10-CM | POA: Diagnosis not present

## 2020-06-27 DIAGNOSIS — Z4931 Encounter for adequacy testing for hemodialysis: Secondary | ICD-10-CM | POA: Diagnosis not present

## 2020-06-28 NOTE — Telephone Encounter (Signed)
error 

## 2020-06-29 DIAGNOSIS — I509 Heart failure, unspecified: Secondary | ICD-10-CM | POA: Diagnosis not present

## 2020-06-29 DIAGNOSIS — E1122 Type 2 diabetes mellitus with diabetic chronic kidney disease: Secondary | ICD-10-CM | POA: Diagnosis not present

## 2020-06-29 DIAGNOSIS — D509 Iron deficiency anemia, unspecified: Secondary | ICD-10-CM | POA: Diagnosis not present

## 2020-06-29 DIAGNOSIS — Z992 Dependence on renal dialysis: Secondary | ICD-10-CM | POA: Diagnosis not present

## 2020-06-29 DIAGNOSIS — D689 Coagulation defect, unspecified: Secondary | ICD-10-CM | POA: Diagnosis not present

## 2020-06-29 DIAGNOSIS — N186 End stage renal disease: Secondary | ICD-10-CM | POA: Diagnosis not present

## 2020-06-29 DIAGNOSIS — E8779 Other fluid overload: Secondary | ICD-10-CM | POA: Diagnosis not present

## 2020-06-29 DIAGNOSIS — I132 Hypertensive heart and chronic kidney disease with heart failure and with stage 5 chronic kidney disease, or end stage renal disease: Secondary | ICD-10-CM | POA: Diagnosis not present

## 2020-06-29 DIAGNOSIS — Z4931 Encounter for adequacy testing for hemodialysis: Secondary | ICD-10-CM | POA: Diagnosis not present

## 2020-07-01 DIAGNOSIS — Z4931 Encounter for adequacy testing for hemodialysis: Secondary | ICD-10-CM | POA: Diagnosis not present

## 2020-07-01 DIAGNOSIS — D689 Coagulation defect, unspecified: Secondary | ICD-10-CM | POA: Diagnosis not present

## 2020-07-01 DIAGNOSIS — I132 Hypertensive heart and chronic kidney disease with heart failure and with stage 5 chronic kidney disease, or end stage renal disease: Secondary | ICD-10-CM | POA: Diagnosis not present

## 2020-07-01 DIAGNOSIS — Z992 Dependence on renal dialysis: Secondary | ICD-10-CM | POA: Diagnosis not present

## 2020-07-01 DIAGNOSIS — E8779 Other fluid overload: Secondary | ICD-10-CM | POA: Diagnosis not present

## 2020-07-01 DIAGNOSIS — D509 Iron deficiency anemia, unspecified: Secondary | ICD-10-CM | POA: Diagnosis not present

## 2020-07-01 DIAGNOSIS — N186 End stage renal disease: Secondary | ICD-10-CM | POA: Diagnosis not present

## 2020-07-01 DIAGNOSIS — I509 Heart failure, unspecified: Secondary | ICD-10-CM | POA: Diagnosis not present

## 2020-07-01 DIAGNOSIS — E1122 Type 2 diabetes mellitus with diabetic chronic kidney disease: Secondary | ICD-10-CM | POA: Diagnosis not present

## 2020-07-01 NOTE — Telephone Encounter (Signed)
I think it will be OK to hold.

## 2020-07-01 NOTE — Progress Notes (Deleted)
[place] NOTE  Patient Care Team: [name], MD as PCP - General (Internal Medicine) Skeet Latch, MD as PCP - Cardiology (Cardiology) Donato Heinz, MD as Consulting Physician (Nephrology) [place]:    ASSESSMENT & PLAN:  No problem-specific Assessment & Plan notes found for this encounter.  No orders of the defined types were placed in this encounter.    HISTORY OF PRESENTING ILLNESS:  Brenda Davis 55 y.o. female is here because of ***  REVIEW OF SYSTEMS:   Constitutional: Denies fevers, chills or abnormal night sweats Eyes: Denies blurriness of vision, double vision or watery eyes Ears, nose, mouth, throat, and face: Denies mucositis or sore throat Respiratory: Denies cough, dyspnea or wheezes Cardiovascular: Denies palpitation, chest discomfort or lower extremity swelling Gastrointestinal:  Denies nausea, heartburn or change in bowel habits Skin: Denies abnormal skin rashes Lymphatics: Denies new lymphadenopathy or easy bruising Neurological:Denies numbness, tingling or new weaknesses Behavioral/Psych: Mood is stable, no new changes  All other systems were reviewed with the patient and are negative.  MEDICAL HISTORY:  Past Medical History:  Diagnosis Date   Anemia    CAD (coronary artery disease)    a. cath in 02/2018 showing moderate 3-vessel CAD with 45% mid-LADm 55% LCx and 65% RCA stenosis which was not significant by FFR   ESRD on dialysis ([place])    Gout    HCAP (healthcare-associated pneumonia) 05/06/2016   History of blood transfusion    "related to surgery"   Hyperlipidemia    Hypertension    Morbid obesity ([place])    Pain    LEFT SHOULDER PAIN - WAS SEEN AT AN URGENT CARE - GIVEN SLING FOR COMFORT AND TOLD ROM AS TOLERATED.   Palpitations 09/24/2016   Peritonitis, dialysis-associated (HCC)    SVT (supraventricular tachycardia) ([place])  02/28/2020   Type II diabetes mellitus ([place])    "gastric sleeve OR corrected this" (05/06/2016)    SURGICAL HISTORY: Past Surgical History:  Procedure Laterality Date   AV FISTULA PLACEMENT Left 01/03/2015   Procedure: BRACHIAL CEPHALIC ARTERIOVENOUS  FISTULA CREATION LEFT ARM;  Surgeon: Angelia Mould, MD;  Location: [place];  Service: Vascular;  Laterality: Left;   CARPAL TUNNEL RELEASE Right    [place]  09/14/2017   CORONARY STENT INTERVENTION N/A 01/17/2019   Procedure: CORONARY STENT INTERVENTION;  Surgeon: Jettie Booze, MD;  Location: [place] [facility];  Service: Cardiovascular;  Laterality: N/A;   ESOPHAGOGASTRODUODENOSCOPY N/A 09/04/2012   Procedure: ESOPHAGOGASTRODUODENOSCOPY (EGD);  Surgeon: Shann Medal, MD;  [facility];  Service: General;  Laterality: N/A;  PF   ESOPHAGOGASTRODUODENOSCOPY (EGD) WITH ESOPHAGEAL DILATION N/A 09/29/2012   Procedure: ESOPHAGOGASTRODUODENOSCOPY (EGD) WITH ESOPHAGEAL DILATION;  Surgeon: Milus Banister, MD;  [facility];  Service: Endoscopy;  Laterality: N/A;   FEMORAL ARTERY EXPLORATION Right 01/17/2019   Procedure: Exploration Right Groin with Primary Closure or Arteriotomy Site; Washout of Retroperitoneal Hematoma;  Surgeon: Waynetta Sandy, MD;  Location: [place];  Service: Vascular;  Laterality: Right;   INSERTION OF DIALYSIS CATHETER N/A 01/03/2015   Procedure: INSERTION OF DIALYSIS CATHETER RIGHT INTERNAL JUGULAR;  Surgeon: Angelia Mould, MD;  Location: [place];  Service: Vascular;  Laterality: N/A;   LAPAROSCOPIC GASTRIC SLEEVE RESECTION N/A 07/19/2012   Procedure: LAPAROSCOPIC SLEEVE GASTRECTOMY with EGD;  Surgeon: Madilyn Hook, DO;  [facility];  Service: General;  Laterality: N/A;  laparoscopic sleeve gastrectomy with EGD   LEFT HEART CATH AND CORONARY ANGIOGRAPHY N/A 02/25/2018   Procedure: LEFT HEART CATH AND CORONARY ANGIOGRAPHY;  Surgeon: Leonie Man, MD;  Location: [place] [facility];  Service: Cardiovascular;  Laterality: N/A;   LEFT HEART CATH AND CORONARY ANGIOGRAPHY N/A 06/20/2018   Procedure: LEFT HEART CATH AND CORONARY ANGIOGRAPHY;  Surgeon: Nelva Bush, MD;  Location: [place] [facility];  Service: Cardiovascular;  Laterality: N/A;   LEFT HEART CATH AND CORONARY ANGIOGRAPHY N/A 01/16/2019   Procedure: LEFT HEART CATH AND CORONARY ANGIOGRAPHY;  Surgeon: Jettie Booze, MD;  Location: [place] [facility];  Service: Cardiovascular;  Laterality: N/A;   LEFT HEART CATH AND CORONARY ANGIOGRAPHY N/A 03/03/2019   Procedure: LEFT HEART CATH AND CORONARY ANGIOGRAPHY;  Surgeon: Jettie Booze, MD;  Location: [place] [facility];  Service: Cardiovascular;  Laterality: N/A;   PERIPHERAL VASCULAR BALLOON ANGIOPLASTY Left 08/18/2019   Procedure: PERIPHERAL VASCULAR BALLOON ANGIOPLASTY;  Surgeon: Marty Heck, MD;  Location: [place] [facility];  Service: Cardiovascular;  Laterality: Left;  arm fistula   PERIPHERAL VASCULAR CATHETERIZATION Left 05/23/2015   Procedure: Nolon Stalls;  Surgeon: Conrad [place], MD;  Location: [place] [facility];  Service: Cardiovascular;  Laterality: Left;  upper aRM   PERIPHERAL VASCULAR CATHETERIZATION Left 05/23/2015   Procedure: Peripheral Vascular Balloon Angioplasty;  Surgeon: Conrad [place], MD;  Location: [place] [facility];  Service: Cardiovascular;  Laterality: Left;  av fistula   TUBAL LIGATION  1993   UPPER GI ENDOSCOPY N/A 07/19/2012   Procedure: UPPER GI ENDOSCOPY;  Surgeon: Madilyn Hook, DO;  [facility];  Service: General;  Laterality: N/A;    SOCIAL HISTORY: Social History   Socioeconomic History   Marital status: Married    Spouse name: Not on file   Number of children: 3   Years of education: Not on file   Highest education level: Not on file  Occupational History   Occupation: customer service    Employer: [employer]  Tobacco Use   Smoking status: Former    Packs/day: 1.00     Years: 16.00    Pack years: 16.00    Types: Cigarettes    Quit date: 01/19/2001    Years since quitting: 19.4   Smokeless tobacco: Never  Vaping Use   Vaping Use: Never used  Substance and Sexual Activity   Alcohol use: Never   Drug use: No   Sexual activity: Yes    Birth control/protection: Post-menopausal  Other Topics Concern   Not on file  Social History Narrative   Not on file   Social Determinants of Health   Financial Resource Strain: Not on file  Food Insecurity: Not on file  Transportation Needs: Not on file  Physical Activity: Not on file  Stress: Not on file  Social Connections: Not on file  Intimate Partner Violence: Not on file    FAMILY HISTORY: Family History  Problem Relation Age of Onset   Hypertension Mother    Mitral valve prolapse Mother    Diabetes Father    Arthritis Other    Hyperlipidemia Other    Cancer Neg Hx    Heart disease Neg Hx    Kidney disease Neg Hx    Stroke Neg Hx     ALLERGIES:  is allergic to amlodipine, atorvastatin, clonidine derivatives, doxycycline, and welchol [colesevelam hcl].  MEDICATIONS:  Current Outpatient Medications  Medication Sig Dispense Refill   acetaminophen (TYLENOL) 500 MG  tablet Take 1,000 mg by mouth every 6 (six) hours as needed for moderate pain or headache.     B Complex-C-Biotin-D-FA (DIALYVITE 800 PLUS D) 800 MCG WAFR Take by mouth.     calcitRIOL (ROCALTROL) 0.5 MCG capsule Take 1.5 mcg by mouth daily.      calcium acetate (PHOSLO) 667 MG capsule Take 667-2,001 mg by mouth See admin instructions. Take 2001 mg with each meal and 667 mg with each snack     carvedilol (COREG) 25 MG tablet Take 1 tablet (25 mg total) by mouth 2 (two) times daily. 180 tablet 3   cinacalcet (SENSIPAR) 30 MG tablet TAKE 2 TABLETS BY MOUTH ON MON, WED & FRI AT BEDTIME     diltiazem (CARDIZEM CD) 120 MG 24 hr capsule Take 1 capsule (120 mg total) by mouth daily. 90 capsule 3   ferric citrate (AURYXIA) 1 GM 210 MG(Fe)  tablet Take 2 tablets (420 mg total) by mouth 3 (three) times daily with meals. 270 tablet 0   gabapentin (NEURONTIN) 100 MG capsule Take 100 mg by mouth 2 (two) times daily.     iron sucrose in sodium chloride 0.9 % 100 mL Iron Sucrose (Venofer)     isosorbide mononitrate (IMDUR) 30 MG 24 hr tablet Take 1 tablet (30 mg total) by mouth daily. 30 tablet 6   mupirocin ointment (BACTROBAN) 2 % Apply topically.     nitroGLYCERIN (NITROSTAT) 0.4 MG SL tablet TAKE 1 TABLET BY MOUTH UNDER THE TONGUE EVERY 5 MINUTES AS NEEDED FOR CHEST PAIN (Patient taking differently: Place 0.4 mg under the tongue every 5 (five) minutes as needed for chest pain.) 50 tablet 2   ondansetron (ZOFRAN) 4 MG tablet Take 1 tablet (4 mg total) by mouth every 8 (eight) hours as needed for nausea or vomiting. 20 tablet 0   rosuvastatin (CRESTOR) 20 MG tablet Take 1 tablet (20 mg total) by mouth daily. 90 tablet 3   thiamine (VITAMIN B-1) 50 MG tablet Take 1 tablet (50 mg total) by mouth daily. 90 tablet 1   ticagrelor (BRILINTA) 90 MG TABS tablet Take 1 tablet (90 mg total) by mouth 2 (two) times daily. 32 tablet 0   No current facility-administered medications for this visit.     PHYSICAL EXAMINATION: ECOG PERFORMANCE STATUS: {CHL ONC ECOG PS:[phone]}  There were no vitals filed for this visit. There were no vitals filed for this visit.  GENERAL:alert, no distress and comfortable SKIN: skin color, texture, turgor are normal, no rashes or significant lesions EYES: normal, conjunctiva are pink and non-injected, sclera clear OROPHARYNX:no exudate, no erythema and lips, buccal mucosa, and tongue normal  NECK: supple, thyroid normal size, non-tender, without nodularity LYMPH:  no palpable lymphadenopathy in the cervical, axillary or inguinal LUNGS: clear to auscultation and percussion with normal breathing effort HEART: regular rate & rhythm and no murmurs and no lower extremity edema ABDOMEN:abdomen soft, non-tender and  normal bowel sounds Musculoskeletal:no cyanosis of digits and no clubbing  PSYCH: alert & oriented x 3 with fluent speech NEURO: no focal motor/sensory deficits  LABORATORY DATA:  I have reviewed the data as listed Lab Results  Component Value Date   WBC 3.8 02/28/2020   HGB 8.1 (L) 02/28/2020   HCT 25.0 (L) 02/28/2020   MCV 98 (H) 02/28/2020   PLT 218 02/28/2020     Chemistry      Component Value Date/Time   NA 139 02/28/2020 1047   K 5.6 (H) 02/28/2020 1047   CL  95 (L) 02/28/2020 1047   CO2 26 02/28/2020 1047   BUN 29 (H) 02/28/2020 1047   CREATININE 7.67 (H) 02/28/2020 1047   CREATININE 2.28 (H) 03/21/2012 0952      Component Value Date/Time   CALCIUM 9.0 02/28/2020 1047   ALKPHOS 85 02/28/2020 1047   AST 17 02/28/2020 1047   ALT 17 02/28/2020 1047   BILITOT 0.5 02/28/2020 1047       RADIOGRAPHIC STUDIES: I have personally reviewed the radiological images as listed and agreed with the findings in the report. No results found.  All questions were answered. The patient knows to call the clinic with any problems, questions or concerns. I spent *** minutes in the care of this patient including H and P, review of records, counseling and coordination of care.     Benay Pike, MD 07/01/2020 9:57 PM

## 2020-07-02 ENCOUNTER — Inpatient Hospital Stay: Payer: Medicare HMO

## 2020-07-02 ENCOUNTER — Telehealth: Payer: Self-pay

## 2020-07-02 ENCOUNTER — Inpatient Hospital Stay: Payer: Medicare HMO | Attending: Hematology and Oncology | Admitting: Hematology and Oncology

## 2020-07-02 NOTE — Telephone Encounter (Signed)
    Brenda Davis DOB:  August 05, 1965  MRN:  [id number]   Primary Cardiologist: Skeet Latch, MD  Chart reviewed as part of pre-operative protocol coverage. Given past medical history and time since last visit, based on ACC/AHA guidelines, Brenda Davis would be at acceptable risk for the planned procedure without further cardiovascular testing.   Pt has a hx of in-stent restenosis per [facility] 02/2019 with recs at that time to continue uninterrupted DAPT for 12 months until 02/2020. She is out of that window however given her CAD hx, case was reviewed with Dr. Oval Linsey who agreed that she would be at acceptable but higher risk to be off antiplatelet therapy prior to her procedure but this is not prohibitive. She may hold Brilinta 5 days prior to procedure and resume as soon as possible thereafter.   The patient was advised that if she develops new symptoms prior to surgery to contact our office to arrange for a follow-up visit, and she verbalized understanding.  I will route this recommendation to the requesting party via Epic fax function and remove from pre-op pool.  Please call with questions.  Kathyrn Drown, NP 07/02/2020, 1:50 PM

## 2020-07-02 NOTE — Telephone Encounter (Signed)
Attempted to call patient regarding missed MD and lab appts on 6/14. Left voicemail. Scheduling message sent to reschedule appt.

## 2020-07-03 DIAGNOSIS — Z992 Dependence on renal dialysis: Secondary | ICD-10-CM | POA: Diagnosis not present

## 2020-07-03 DIAGNOSIS — D509 Iron deficiency anemia, unspecified: Secondary | ICD-10-CM | POA: Diagnosis not present

## 2020-07-03 DIAGNOSIS — E1122 Type 2 diabetes mellitus with diabetic chronic kidney disease: Secondary | ICD-10-CM | POA: Diagnosis not present

## 2020-07-03 DIAGNOSIS — N186 End stage renal disease: Secondary | ICD-10-CM | POA: Diagnosis not present

## 2020-07-03 DIAGNOSIS — D689 Coagulation defect, unspecified: Secondary | ICD-10-CM | POA: Diagnosis not present

## 2020-07-03 DIAGNOSIS — Z4931 Encounter for adequacy testing for hemodialysis: Secondary | ICD-10-CM | POA: Diagnosis not present

## 2020-07-03 DIAGNOSIS — E8779 Other fluid overload: Secondary | ICD-10-CM | POA: Diagnosis not present

## 2020-07-03 DIAGNOSIS — I509 Heart failure, unspecified: Secondary | ICD-10-CM | POA: Diagnosis not present

## 2020-07-03 DIAGNOSIS — I132 Hypertensive heart and chronic kidney disease with heart failure and with stage 5 chronic kidney disease, or end stage renal disease: Secondary | ICD-10-CM | POA: Diagnosis not present

## 2020-07-03 NOTE — Telephone Encounter (Signed)
Spoke to patient and told her she could hold her Brilinta for 5 days prior to her procedure.  Patient agreed.

## 2020-07-04 DIAGNOSIS — E1122 Type 2 diabetes mellitus with diabetic chronic kidney disease: Secondary | ICD-10-CM | POA: Diagnosis not present

## 2020-07-04 DIAGNOSIS — E8779 Other fluid overload: Secondary | ICD-10-CM | POA: Diagnosis not present

## 2020-07-04 DIAGNOSIS — Z992 Dependence on renal dialysis: Secondary | ICD-10-CM | POA: Diagnosis not present

## 2020-07-04 DIAGNOSIS — I132 Hypertensive heart and chronic kidney disease with heart failure and with stage 5 chronic kidney disease, or end stage renal disease: Secondary | ICD-10-CM | POA: Diagnosis not present

## 2020-07-04 DIAGNOSIS — D509 Iron deficiency anemia, unspecified: Secondary | ICD-10-CM | POA: Diagnosis not present

## 2020-07-04 DIAGNOSIS — I509 Heart failure, unspecified: Secondary | ICD-10-CM | POA: Diagnosis not present

## 2020-07-04 DIAGNOSIS — D689 Coagulation defect, unspecified: Secondary | ICD-10-CM | POA: Diagnosis not present

## 2020-07-04 DIAGNOSIS — N186 End stage renal disease: Secondary | ICD-10-CM | POA: Diagnosis not present

## 2020-07-04 DIAGNOSIS — Z4931 Encounter for adequacy testing for hemodialysis: Secondary | ICD-10-CM | POA: Diagnosis not present

## 2020-07-06 DIAGNOSIS — I509 Heart failure, unspecified: Secondary | ICD-10-CM | POA: Diagnosis not present

## 2020-07-06 DIAGNOSIS — D689 Coagulation defect, unspecified: Secondary | ICD-10-CM | POA: Diagnosis not present

## 2020-07-06 DIAGNOSIS — Z992 Dependence on renal dialysis: Secondary | ICD-10-CM | POA: Diagnosis not present

## 2020-07-06 DIAGNOSIS — I132 Hypertensive heart and chronic kidney disease with heart failure and with stage 5 chronic kidney disease, or end stage renal disease: Secondary | ICD-10-CM | POA: Diagnosis not present

## 2020-07-06 DIAGNOSIS — N186 End stage renal disease: Secondary | ICD-10-CM | POA: Diagnosis not present

## 2020-07-06 DIAGNOSIS — D509 Iron deficiency anemia, unspecified: Secondary | ICD-10-CM | POA: Diagnosis not present

## 2020-07-06 DIAGNOSIS — E8779 Other fluid overload: Secondary | ICD-10-CM | POA: Diagnosis not present

## 2020-07-06 DIAGNOSIS — Z4931 Encounter for adequacy testing for hemodialysis: Secondary | ICD-10-CM | POA: Diagnosis not present

## 2020-07-06 DIAGNOSIS — E1122 Type 2 diabetes mellitus with diabetic chronic kidney disease: Secondary | ICD-10-CM | POA: Diagnosis not present

## 2020-07-08 DIAGNOSIS — E1122 Type 2 diabetes mellitus with diabetic chronic kidney disease: Secondary | ICD-10-CM | POA: Diagnosis not present

## 2020-07-08 DIAGNOSIS — D689 Coagulation defect, unspecified: Secondary | ICD-10-CM | POA: Diagnosis not present

## 2020-07-08 DIAGNOSIS — Z4931 Encounter for adequacy testing for hemodialysis: Secondary | ICD-10-CM | POA: Diagnosis not present

## 2020-07-08 DIAGNOSIS — Z992 Dependence on renal dialysis: Secondary | ICD-10-CM | POA: Diagnosis not present

## 2020-07-08 DIAGNOSIS — I132 Hypertensive heart and chronic kidney disease with heart failure and with stage 5 chronic kidney disease, or end stage renal disease: Secondary | ICD-10-CM | POA: Diagnosis not present

## 2020-07-08 DIAGNOSIS — E8779 Other fluid overload: Secondary | ICD-10-CM | POA: Diagnosis not present

## 2020-07-08 DIAGNOSIS — I509 Heart failure, unspecified: Secondary | ICD-10-CM | POA: Diagnosis not present

## 2020-07-08 DIAGNOSIS — D509 Iron deficiency anemia, unspecified: Secondary | ICD-10-CM | POA: Diagnosis not present

## 2020-07-08 DIAGNOSIS — N186 End stage renal disease: Secondary | ICD-10-CM | POA: Diagnosis not present

## 2020-07-11 DIAGNOSIS — E8779 Other fluid overload: Secondary | ICD-10-CM | POA: Diagnosis not present

## 2020-07-11 DIAGNOSIS — D689 Coagulation defect, unspecified: Secondary | ICD-10-CM | POA: Diagnosis not present

## 2020-07-11 DIAGNOSIS — I509 Heart failure, unspecified: Secondary | ICD-10-CM | POA: Diagnosis not present

## 2020-07-11 DIAGNOSIS — E1122 Type 2 diabetes mellitus with diabetic chronic kidney disease: Secondary | ICD-10-CM | POA: Diagnosis not present

## 2020-07-11 DIAGNOSIS — N186 End stage renal disease: Secondary | ICD-10-CM | POA: Diagnosis not present

## 2020-07-11 DIAGNOSIS — D509 Iron deficiency anemia, unspecified: Secondary | ICD-10-CM | POA: Diagnosis not present

## 2020-07-11 DIAGNOSIS — Z992 Dependence on renal dialysis: Secondary | ICD-10-CM | POA: Diagnosis not present

## 2020-07-11 DIAGNOSIS — I132 Hypertensive heart and chronic kidney disease with heart failure and with stage 5 chronic kidney disease, or end stage renal disease: Secondary | ICD-10-CM | POA: Diagnosis not present

## 2020-07-11 DIAGNOSIS — Z4931 Encounter for adequacy testing for hemodialysis: Secondary | ICD-10-CM | POA: Diagnosis not present

## 2020-07-13 DIAGNOSIS — E8779 Other fluid overload: Secondary | ICD-10-CM | POA: Diagnosis not present

## 2020-07-13 DIAGNOSIS — E1122 Type 2 diabetes mellitus with diabetic chronic kidney disease: Secondary | ICD-10-CM | POA: Diagnosis not present

## 2020-07-13 DIAGNOSIS — I509 Heart failure, unspecified: Secondary | ICD-10-CM | POA: Diagnosis not present

## 2020-07-13 DIAGNOSIS — D689 Coagulation defect, unspecified: Secondary | ICD-10-CM | POA: Diagnosis not present

## 2020-07-13 DIAGNOSIS — D509 Iron deficiency anemia, unspecified: Secondary | ICD-10-CM | POA: Diagnosis not present

## 2020-07-13 DIAGNOSIS — Z4931 Encounter for adequacy testing for hemodialysis: Secondary | ICD-10-CM | POA: Diagnosis not present

## 2020-07-13 DIAGNOSIS — Z992 Dependence on renal dialysis: Secondary | ICD-10-CM | POA: Diagnosis not present

## 2020-07-13 DIAGNOSIS — N186 End stage renal disease: Secondary | ICD-10-CM | POA: Diagnosis not present

## 2020-07-13 DIAGNOSIS — I132 Hypertensive heart and chronic kidney disease with heart failure and with stage 5 chronic kidney disease, or end stage renal disease: Secondary | ICD-10-CM | POA: Diagnosis not present

## 2020-07-15 DIAGNOSIS — E8779 Other fluid overload: Secondary | ICD-10-CM | POA: Diagnosis not present

## 2020-07-15 DIAGNOSIS — Z992 Dependence on renal dialysis: Secondary | ICD-10-CM | POA: Diagnosis not present

## 2020-07-15 DIAGNOSIS — N186 End stage renal disease: Secondary | ICD-10-CM | POA: Diagnosis not present

## 2020-07-15 DIAGNOSIS — D509 Iron deficiency anemia, unspecified: Secondary | ICD-10-CM | POA: Diagnosis not present

## 2020-07-15 DIAGNOSIS — I509 Heart failure, unspecified: Secondary | ICD-10-CM | POA: Diagnosis not present

## 2020-07-15 DIAGNOSIS — I132 Hypertensive heart and chronic kidney disease with heart failure and with stage 5 chronic kidney disease, or end stage renal disease: Secondary | ICD-10-CM | POA: Diagnosis not present

## 2020-07-15 DIAGNOSIS — Z4931 Encounter for adequacy testing for hemodialysis: Secondary | ICD-10-CM | POA: Diagnosis not present

## 2020-07-15 DIAGNOSIS — D689 Coagulation defect, unspecified: Secondary | ICD-10-CM | POA: Diagnosis not present

## 2020-07-15 DIAGNOSIS — E1122 Type 2 diabetes mellitus with diabetic chronic kidney disease: Secondary | ICD-10-CM | POA: Diagnosis not present

## 2020-07-17 DIAGNOSIS — D689 Coagulation defect, unspecified: Secondary | ICD-10-CM | POA: Diagnosis not present

## 2020-07-17 DIAGNOSIS — N186 End stage renal disease: Secondary | ICD-10-CM | POA: Diagnosis not present

## 2020-07-17 DIAGNOSIS — I132 Hypertensive heart and chronic kidney disease with heart failure and with stage 5 chronic kidney disease, or end stage renal disease: Secondary | ICD-10-CM | POA: Diagnosis not present

## 2020-07-17 DIAGNOSIS — D509 Iron deficiency anemia, unspecified: Secondary | ICD-10-CM | POA: Diagnosis not present

## 2020-07-17 DIAGNOSIS — I509 Heart failure, unspecified: Secondary | ICD-10-CM | POA: Diagnosis not present

## 2020-07-17 DIAGNOSIS — Z4931 Encounter for adequacy testing for hemodialysis: Secondary | ICD-10-CM | POA: Diagnosis not present

## 2020-07-17 DIAGNOSIS — E1122 Type 2 diabetes mellitus with diabetic chronic kidney disease: Secondary | ICD-10-CM | POA: Diagnosis not present

## 2020-07-17 DIAGNOSIS — Z992 Dependence on renal dialysis: Secondary | ICD-10-CM | POA: Diagnosis not present

## 2020-07-17 DIAGNOSIS — E8779 Other fluid overload: Secondary | ICD-10-CM | POA: Diagnosis not present

## 2020-07-18 DIAGNOSIS — E1122 Type 2 diabetes mellitus with diabetic chronic kidney disease: Secondary | ICD-10-CM | POA: Diagnosis not present

## 2020-07-18 DIAGNOSIS — N186 End stage renal disease: Secondary | ICD-10-CM | POA: Diagnosis not present

## 2020-07-18 DIAGNOSIS — Z992 Dependence on renal dialysis: Secondary | ICD-10-CM | POA: Diagnosis not present

## 2020-07-19 DIAGNOSIS — D631 Anemia in chronic kidney disease: Secondary | ICD-10-CM | POA: Diagnosis not present

## 2020-07-19 DIAGNOSIS — N186 End stage renal disease: Secondary | ICD-10-CM | POA: Diagnosis not present

## 2020-07-19 DIAGNOSIS — E8779 Other fluid overload: Secondary | ICD-10-CM | POA: Diagnosis not present

## 2020-07-19 DIAGNOSIS — Z992 Dependence on renal dialysis: Secondary | ICD-10-CM | POA: Diagnosis not present

## 2020-07-19 DIAGNOSIS — I509 Heart failure, unspecified: Secondary | ICD-10-CM | POA: Diagnosis not present

## 2020-07-19 DIAGNOSIS — E1122 Type 2 diabetes mellitus with diabetic chronic kidney disease: Secondary | ICD-10-CM | POA: Diagnosis not present

## 2020-07-19 DIAGNOSIS — Z23 Encounter for immunization: Secondary | ICD-10-CM | POA: Diagnosis not present

## 2020-07-19 DIAGNOSIS — I132 Hypertensive heart and chronic kidney disease with heart failure and with stage 5 chronic kidney disease, or end stage renal disease: Secondary | ICD-10-CM | POA: Diagnosis not present

## 2020-07-19 DIAGNOSIS — D509 Iron deficiency anemia, unspecified: Secondary | ICD-10-CM | POA: Diagnosis not present

## 2020-07-20 DIAGNOSIS — E8779 Other fluid overload: Secondary | ICD-10-CM | POA: Diagnosis not present

## 2020-07-20 DIAGNOSIS — E1122 Type 2 diabetes mellitus with diabetic chronic kidney disease: Secondary | ICD-10-CM | POA: Diagnosis not present

## 2020-07-20 DIAGNOSIS — Z992 Dependence on renal dialysis: Secondary | ICD-10-CM | POA: Diagnosis not present

## 2020-07-20 DIAGNOSIS — I132 Hypertensive heart and chronic kidney disease with heart failure and with stage 5 chronic kidney disease, or end stage renal disease: Secondary | ICD-10-CM | POA: Diagnosis not present

## 2020-07-20 DIAGNOSIS — N186 End stage renal disease: Secondary | ICD-10-CM | POA: Diagnosis not present

## 2020-07-20 DIAGNOSIS — Z23 Encounter for immunization: Secondary | ICD-10-CM | POA: Diagnosis not present

## 2020-07-20 DIAGNOSIS — D509 Iron deficiency anemia, unspecified: Secondary | ICD-10-CM | POA: Diagnosis not present

## 2020-07-20 DIAGNOSIS — I509 Heart failure, unspecified: Secondary | ICD-10-CM | POA: Diagnosis not present

## 2020-07-20 DIAGNOSIS — D631 Anemia in chronic kidney disease: Secondary | ICD-10-CM | POA: Diagnosis not present

## 2020-07-23 ENCOUNTER — Telehealth: Payer: Self-pay | Admitting: Cardiovascular Disease

## 2020-07-23 DIAGNOSIS — E1122 Type 2 diabetes mellitus with diabetic chronic kidney disease: Secondary | ICD-10-CM | POA: Diagnosis not present

## 2020-07-23 DIAGNOSIS — E8779 Other fluid overload: Secondary | ICD-10-CM | POA: Diagnosis not present

## 2020-07-23 DIAGNOSIS — D631 Anemia in chronic kidney disease: Secondary | ICD-10-CM | POA: Diagnosis not present

## 2020-07-23 DIAGNOSIS — N186 End stage renal disease: Secondary | ICD-10-CM | POA: Diagnosis not present

## 2020-07-23 DIAGNOSIS — Z992 Dependence on renal dialysis: Secondary | ICD-10-CM | POA: Diagnosis not present

## 2020-07-23 DIAGNOSIS — I132 Hypertensive heart and chronic kidney disease with heart failure and with stage 5 chronic kidney disease, or end stage renal disease: Secondary | ICD-10-CM | POA: Diagnosis not present

## 2020-07-23 DIAGNOSIS — D509 Iron deficiency anemia, unspecified: Secondary | ICD-10-CM | POA: Diagnosis not present

## 2020-07-23 DIAGNOSIS — I509 Heart failure, unspecified: Secondary | ICD-10-CM | POA: Diagnosis not present

## 2020-07-23 DIAGNOSIS — Z23 Encounter for immunization: Secondary | ICD-10-CM | POA: Diagnosis not present

## 2020-07-23 NOTE — Telephone Encounter (Signed)
*  STAT* If patient is at the pharmacy, call can be transferred to refill team.   1. Which medications need to be refilled? (please list name of each medication and dose if known)  nitroGLYCERIN (NITROSTAT) 0.4 MG SL tablet  2. Which pharmacy/location (including street and city if local pharmacy) is medication to be sent to? [id number]- [place], [place] - [address]  3. Do they need a 30 day or 90 day supply?  Patient is requesting a small supply for emergencies.

## 2020-07-24 ENCOUNTER — Ambulatory Visit: Payer: Medicare HMO | Admitting: Internal Medicine

## 2020-07-25 DIAGNOSIS — E1122 Type 2 diabetes mellitus with diabetic chronic kidney disease: Secondary | ICD-10-CM | POA: Diagnosis not present

## 2020-07-25 DIAGNOSIS — I132 Hypertensive heart and chronic kidney disease with heart failure and with stage 5 chronic kidney disease, or end stage renal disease: Secondary | ICD-10-CM | POA: Diagnosis not present

## 2020-07-25 DIAGNOSIS — Z23 Encounter for immunization: Secondary | ICD-10-CM | POA: Diagnosis not present

## 2020-07-25 DIAGNOSIS — N186 End stage renal disease: Secondary | ICD-10-CM | POA: Diagnosis not present

## 2020-07-25 DIAGNOSIS — D509 Iron deficiency anemia, unspecified: Secondary | ICD-10-CM | POA: Diagnosis not present

## 2020-07-25 DIAGNOSIS — Z992 Dependence on renal dialysis: Secondary | ICD-10-CM | POA: Diagnosis not present

## 2020-07-25 DIAGNOSIS — D631 Anemia in chronic kidney disease: Secondary | ICD-10-CM | POA: Diagnosis not present

## 2020-07-25 DIAGNOSIS — I509 Heart failure, unspecified: Secondary | ICD-10-CM | POA: Diagnosis not present

## 2020-07-25 DIAGNOSIS — E8779 Other fluid overload: Secondary | ICD-10-CM | POA: Diagnosis not present

## 2020-07-26 MED ORDER — NITROGLYCERIN 0.4 MG SL SUBL: 0.4000 mg | SUBLINGUAL_TABLET | SUBLINGUAL | 3 refills | Status: AC | PRN

## 2020-07-27 DIAGNOSIS — Z992 Dependence on renal dialysis: Secondary | ICD-10-CM | POA: Diagnosis not present

## 2020-07-27 DIAGNOSIS — E8779 Other fluid overload: Secondary | ICD-10-CM | POA: Diagnosis not present

## 2020-07-27 DIAGNOSIS — D509 Iron deficiency anemia, unspecified: Secondary | ICD-10-CM | POA: Diagnosis not present

## 2020-07-27 DIAGNOSIS — N186 End stage renal disease: Secondary | ICD-10-CM | POA: Diagnosis not present

## 2020-07-27 DIAGNOSIS — E1122 Type 2 diabetes mellitus with diabetic chronic kidney disease: Secondary | ICD-10-CM | POA: Diagnosis not present

## 2020-07-27 DIAGNOSIS — Z23 Encounter for immunization: Secondary | ICD-10-CM | POA: Diagnosis not present

## 2020-07-27 DIAGNOSIS — D631 Anemia in chronic kidney disease: Secondary | ICD-10-CM | POA: Diagnosis not present

## 2020-07-27 DIAGNOSIS — I132 Hypertensive heart and chronic kidney disease with heart failure and with stage 5 chronic kidney disease, or end stage renal disease: Secondary | ICD-10-CM | POA: Diagnosis not present

## 2020-07-27 DIAGNOSIS — I509 Heart failure, unspecified: Secondary | ICD-10-CM | POA: Diagnosis not present

## 2020-07-27 NOTE — Progress Notes (Deleted)
Subjective:    Patient ID: Brenda Davis, female    DOB: 08/28/1965, 55 y.o.   MRN: [id number]  HPI The patient is here for an acute visit.  Pain in right foot -    Medications and allergies reviewed with patient and updated if appropriate.  Patient Active Problem List   Diagnosis Date Noted   Cough 04/03/2020   Snoring 03/30/2020   SVT (supraventricular tachycardia) ([place]) 02/28/2020   Hypertension 02/23/2020   Diabetic neuropathy, painful ([place]) 02/23/2020   Anemia due to acquired thiamine deficiency 04/10/2019   Deficiency anemia 04/04/2019   Unstable angina (HCC) 03/03/2019   Increased anion gap metabolic acidosis 16/94/5038   Hyperkalemia    Coronary artery disease involving native coronary artery of native heart with unstable angina pectoris (HCC)    Hypocalcemia 06/18/2018   Localized swelling of finger of left hand 06/01/2018   Folate deficiency 03/08/2018   NSTEMI (non-ST elevated myocardial infarction) ([place])    CAD in native artery    Hepatic cirrhosis ([place]) 11/24/2017   DM (diabetes mellitus), type 2 with renal complications ([place]) 88/28/0034   Chronic heart failure with preserved ejection fraction ([place]) 06/22/2017   Sensorineural hearing loss (SNHL), bilateral 06/29/2016   Allergic rhinitis 06/27/2016   Severe persistent asthma with exacerbation 05/12/2016   Anemia of chronic disease 05/06/2016   Routine general medical examination at a health care facility 12/05/2015   Cervical cancer screening 12/05/2015   Visit for screening mammogram 01/23/2015   Gout due to renal impairment 09/13/2014   Thiamine deficiency 12/02/2012   Hypokalemia 11/28/2012   S/P laparoscopic sleeve gastrectomy 11/28/2012   B12 deficiency anemia 11/28/2012   Morbid obesity with BMI of 40.0-44.9, adult ([place]) 07/22/2012   Dyslipidemia, goal LDL below 70 04/09/2008   CKD (chronic kidney disease) stage V requiring chronic dialysis (MWF) 02/08/2008    Current Outpatient  Medications on File Prior to Visit  Medication Sig Dispense Refill   acetaminophen (TYLENOL) 500 MG tablet Take 1,000 mg by mouth every 6 (six) hours as needed for moderate pain or headache.     B Complex-C-Biotin-D-FA (DIALYVITE 800 PLUS D) 800 MCG WAFR Take by mouth.     calcitRIOL (ROCALTROL) 0.5 MCG capsule Take 1.5 mcg by mouth daily.      calcium acetate (PHOSLO) 667 MG capsule Take 667-2,001 mg by mouth See admin instructions. Take 2001 mg with each meal and 667 mg with each snack     carvedilol (COREG) 25 MG tablet Take 1 tablet (25 mg total) by mouth 2 (two) times daily. 180 tablet 3   cinacalcet (SENSIPAR) 30 MG tablet TAKE 2 TABLETS BY MOUTH ON MON, WED & FRI AT BEDTIME     diltiazem (CARDIZEM CD) 120 MG 24 hr capsule Take 1 capsule (120 mg total) by mouth daily. 90 capsule 3   ferric citrate (AURYXIA) 1 GM 210 MG(Fe) tablet Take 2 tablets (420 mg total) by mouth 3 (three) times daily with meals. 270 tablet 0   gabapentin (NEURONTIN) 100 MG capsule Take 100 mg by mouth 2 (two) times daily.     iron sucrose in sodium chloride 0.9 % 100 mL Iron Sucrose (Venofer)     isosorbide mononitrate (IMDUR) 30 MG 24 hr tablet Take 1 tablet (30 mg total) by mouth daily. 30 tablet 6   mupirocin ointment (BACTROBAN) 2 % Apply topically.     nitroGLYCERIN (NITROSTAT) 0.4 MG SL tablet Place 1 tablet (0.4 mg total) under the tongue every 5 (five)  minutes as needed for chest pain. 75 tablet 3   ondansetron (ZOFRAN) 4 MG tablet Take 1 tablet (4 mg total) by mouth every 8 (eight) hours as needed for nausea or vomiting. 20 tablet 0   rosuvastatin (CRESTOR) 20 MG tablet Take 1 tablet (20 mg total) by mouth daily. 90 tablet 3   thiamine (VITAMIN B-1) 50 MG tablet Take 1 tablet (50 mg total) by mouth daily. 90 tablet 1   ticagrelor (BRILINTA) 90 MG TABS tablet Take 1 tablet (90 mg total) by mouth 2 (two) times daily. 32 tablet 0   No current facility-administered medications on file prior to visit.    Past  Medical History:  Diagnosis Date   Anemia    CAD (coronary artery disease)    a. cath in 02/2018 showing moderate 3-vessel CAD with 45% mid-LADm 55% LCx and 65% RCA stenosis which was not significant by FFR   ESRD on dialysis ([place])    Gout    HCAP (healthcare-associated pneumonia) 05/06/2016   History of blood transfusion    "related to surgery"   Hyperlipidemia    Hypertension    Morbid obesity ([place])    Pain    LEFT SHOULDER PAIN - WAS SEEN AT AN URGENT CARE - GIVEN SLING FOR COMFORT AND TOLD ROM AS TOLERATED.   Palpitations 09/24/2016   Peritonitis, dialysis-associated (HCC)    SVT (supraventricular tachycardia) ([place]) 02/28/2020   Type II diabetes mellitus ([place])    "gastric sleeve OR corrected this" (05/06/2016)    Past Surgical History:  Procedure Laterality Date   AV FISTULA PLACEMENT Left 01/03/2015   Procedure: BRACHIAL CEPHALIC ARTERIOVENOUS  FISTULA CREATION LEFT ARM;  Surgeon: Angelia Mould, MD;  Location: [place];  Service: Vascular;  Laterality: Left;   CARPAL TUNNEL RELEASE Right    [place]  09/14/2017   CORONARY STENT INTERVENTION N/A 01/17/2019   Procedure: CORONARY STENT INTERVENTION;  Surgeon: Jettie Booze, MD;  Location: [place] [facility];  Service: Cardiovascular;  Laterality: N/A;   ESOPHAGOGASTRODUODENOSCOPY N/A 09/04/2012   Procedure: ESOPHAGOGASTRODUODENOSCOPY (EGD);  Surgeon: Shann Medal, MD;  [facility];  Service: General;  Laterality: N/A;  PF   ESOPHAGOGASTRODUODENOSCOPY (EGD) WITH ESOPHAGEAL DILATION N/A 09/29/2012   Procedure: ESOPHAGOGASTRODUODENOSCOPY (EGD) WITH ESOPHAGEAL DILATION;  Surgeon: Milus Banister, MD;  [facility];  Service: Endoscopy;  Laterality: N/A;   FEMORAL ARTERY EXPLORATION Right 01/17/2019   Procedure: Exploration Right Groin with Primary Closure or Arteriotomy Site; Washout of Retroperitoneal Hematoma;  Surgeon: Waynetta Sandy, MD;  Location: [place];  Service: Vascular;  Laterality: Right;   INSERTION OF DIALYSIS CATHETER N/A 01/03/2015   Procedure: INSERTION OF DIALYSIS CATHETER RIGHT INTERNAL JUGULAR;  Surgeon: Angelia Mould, MD;  Location: [place];  Service: Vascular;  Laterality: N/A;   LAPAROSCOPIC GASTRIC SLEEVE RESECTION N/A 07/19/2012   Procedure: LAPAROSCOPIC SLEEVE GASTRECTOMY with EGD;  Surgeon: Madilyn Hook, DO;  [facility];  Service: General;  Laterality: N/A;  laparoscopic sleeve gastrectomy with EGD   LEFT HEART CATH AND CORONARY ANGIOGRAPHY N/A 02/25/2018   Procedure: LEFT HEART CATH AND CORONARY ANGIOGRAPHY;  Surgeon: Leonie Man, MD;  Location: [place] [facility];  Service: Cardiovascular;  Laterality: N/A;   LEFT HEART CATH AND CORONARY ANGIOGRAPHY N/A 06/20/2018   Procedure: LEFT HEART CATH AND CORONARY ANGIOGRAPHY;  Surgeon: Nelva Bush, MD;  Location: [place] [facility];  Service: Cardiovascular;  Laterality: N/A;   LEFT  HEART CATH AND CORONARY ANGIOGRAPHY N/A 01/16/2019   Procedure: LEFT HEART CATH AND CORONARY ANGIOGRAPHY;  Surgeon: Jettie Booze, MD;  Location: [place] [facility];  Service: Cardiovascular;  Laterality: N/A;   LEFT HEART CATH AND CORONARY ANGIOGRAPHY N/A 03/03/2019   Procedure: LEFT HEART CATH AND CORONARY ANGIOGRAPHY;  Surgeon: Jettie Booze, MD;  Location: [place] [facility];  Service: Cardiovascular;  Laterality: N/A;   PERIPHERAL VASCULAR BALLOON ANGIOPLASTY Left 08/18/2019   Procedure: PERIPHERAL VASCULAR BALLOON ANGIOPLASTY;  Surgeon: Marty Heck, MD;  Location: [place] [facility];  Service: Cardiovascular;  Laterality: Left;  arm fistula   PERIPHERAL VASCULAR CATHETERIZATION Left 05/23/2015   Procedure: Nolon Stalls;  Surgeon: Conrad [place], MD;  Location: [place] [facility];  Service: Cardiovascular;  Laterality: Left;  upper aRM   PERIPHERAL VASCULAR CATHETERIZATION Left 05/23/2015   Procedure: Peripheral Vascular Balloon Angioplasty;  Surgeon: Conrad [place],  MD;  Location: [place] [facility];  Service: Cardiovascular;  Laterality: Left;  av fistula   TUBAL LIGATION  1993   UPPER GI ENDOSCOPY N/A 07/19/2012   Procedure: UPPER GI ENDOSCOPY;  Surgeon: Madilyn Hook, DO;  [facility];  Service: General;  Laterality: N/A;    Social History   Socioeconomic History   Marital status: Married    Spouse name: Not on file   Number of children: 3   Years of education: Not on file   Highest education level: Not on file  Occupational History   Occupation: customer service    Employer: [employer]  Tobacco Use   Smoking status: Former    Packs/day: 1.00    Years: 16.00    Pack years: 16.00    Types: Cigarettes    Quit date: 01/19/2001    Years since quitting: 19.5   Smokeless tobacco: Never  Vaping Use   Vaping Use: Never used  Substance and Sexual Activity   Alcohol use: Never   Drug use: No   Sexual activity: Yes    Birth control/protection: Post-menopausal  Other Topics Concern   Not on file  Social History Narrative   Not on file   Social Determinants of Health   Financial Resource Strain: Not on file  Food Insecurity: Not on file  Transportation Needs: Not on file  Physical Activity: Not on file  Stress: Not on file  Social Connections: Not on file    Family History  Problem Relation Age of Onset   Hypertension Mother    Mitral valve prolapse Mother    Diabetes Father    Arthritis Other    Hyperlipidemia Other    Cancer Neg Hx    Heart disease Neg Hx    Kidney disease Neg Hx    Stroke Neg Hx     Review of Systems     Objective:  There were no vitals filed for this visit. BP Readings from Last 3 Encounters:  06/25/20 122/64  04/25/20 (!) 184/84  03/16/20 (!) 151/95   Wt Readings from Last 3 Encounters:  06/25/20 274 lb (124.3 kg)  04/25/20 281 lb 3.2 oz (127.6 kg)  02/28/20 279 lb 3.2 oz (126.6 kg)   There is no height or weight on file to calculate BMI.   Physical Exam         Assessment & Plan:     See Problem List for Assessment and Plan of chronic medical problems.    This visit occurred during the SARS-CoV-2 public health emergency.  Safety protocols were in  place, including screening questions prior to the visit, additional usage of staff PPE, and extensive cleaning of exam room while observing appropriate contact time as indicated for disinfecting solutions.

## 2020-07-29 ENCOUNTER — Ambulatory Visit: Payer: Medicare HMO | Admitting: Internal Medicine

## 2020-07-30 DIAGNOSIS — D509 Iron deficiency anemia, unspecified: Secondary | ICD-10-CM | POA: Diagnosis not present

## 2020-07-30 DIAGNOSIS — D631 Anemia in chronic kidney disease: Secondary | ICD-10-CM | POA: Diagnosis not present

## 2020-07-30 DIAGNOSIS — I509 Heart failure, unspecified: Secondary | ICD-10-CM | POA: Diagnosis not present

## 2020-07-30 DIAGNOSIS — E8779 Other fluid overload: Secondary | ICD-10-CM | POA: Diagnosis not present

## 2020-07-30 DIAGNOSIS — Z23 Encounter for immunization: Secondary | ICD-10-CM | POA: Diagnosis not present

## 2020-07-30 DIAGNOSIS — I132 Hypertensive heart and chronic kidney disease with heart failure and with stage 5 chronic kidney disease, or end stage renal disease: Secondary | ICD-10-CM | POA: Diagnosis not present

## 2020-07-30 DIAGNOSIS — Z992 Dependence on renal dialysis: Secondary | ICD-10-CM | POA: Diagnosis not present

## 2020-07-30 DIAGNOSIS — N186 End stage renal disease: Secondary | ICD-10-CM | POA: Diagnosis not present

## 2020-07-30 DIAGNOSIS — E1122 Type 2 diabetes mellitus with diabetic chronic kidney disease: Secondary | ICD-10-CM | POA: Diagnosis not present

## 2020-08-01 DIAGNOSIS — Z992 Dependence on renal dialysis: Secondary | ICD-10-CM | POA: Diagnosis not present

## 2020-08-01 DIAGNOSIS — D631 Anemia in chronic kidney disease: Secondary | ICD-10-CM | POA: Diagnosis not present

## 2020-08-01 DIAGNOSIS — I132 Hypertensive heart and chronic kidney disease with heart failure and with stage 5 chronic kidney disease, or end stage renal disease: Secondary | ICD-10-CM | POA: Diagnosis not present

## 2020-08-01 DIAGNOSIS — D509 Iron deficiency anemia, unspecified: Secondary | ICD-10-CM | POA: Diagnosis not present

## 2020-08-01 DIAGNOSIS — I509 Heart failure, unspecified: Secondary | ICD-10-CM | POA: Diagnosis not present

## 2020-08-01 DIAGNOSIS — E8779 Other fluid overload: Secondary | ICD-10-CM | POA: Diagnosis not present

## 2020-08-01 DIAGNOSIS — E1122 Type 2 diabetes mellitus with diabetic chronic kidney disease: Secondary | ICD-10-CM | POA: Diagnosis not present

## 2020-08-01 DIAGNOSIS — Z23 Encounter for immunization: Secondary | ICD-10-CM | POA: Diagnosis not present

## 2020-08-01 DIAGNOSIS — N186 End stage renal disease: Secondary | ICD-10-CM | POA: Diagnosis not present

## 2020-08-03 DIAGNOSIS — I132 Hypertensive heart and chronic kidney disease with heart failure and with stage 5 chronic kidney disease, or end stage renal disease: Secondary | ICD-10-CM | POA: Diagnosis not present

## 2020-08-03 DIAGNOSIS — D509 Iron deficiency anemia, unspecified: Secondary | ICD-10-CM | POA: Diagnosis not present

## 2020-08-03 DIAGNOSIS — D631 Anemia in chronic kidney disease: Secondary | ICD-10-CM | POA: Diagnosis not present

## 2020-08-03 DIAGNOSIS — I509 Heart failure, unspecified: Secondary | ICD-10-CM | POA: Diagnosis not present

## 2020-08-03 DIAGNOSIS — Z992 Dependence on renal dialysis: Secondary | ICD-10-CM | POA: Diagnosis not present

## 2020-08-03 DIAGNOSIS — E8779 Other fluid overload: Secondary | ICD-10-CM | POA: Diagnosis not present

## 2020-08-03 DIAGNOSIS — N186 End stage renal disease: Secondary | ICD-10-CM | POA: Diagnosis not present

## 2020-08-03 DIAGNOSIS — E1122 Type 2 diabetes mellitus with diabetic chronic kidney disease: Secondary | ICD-10-CM | POA: Diagnosis not present

## 2020-08-03 DIAGNOSIS — Z23 Encounter for immunization: Secondary | ICD-10-CM | POA: Diagnosis not present

## 2020-08-04 IMAGING — DX DG ABDOMEN ACUTE W/ 1V CHEST
4 series · 5 of 5 positions shown · non-contrast
Comparison: Chest radiographs 05/21/2017, CT abdomen and pelvis
02/05/2015

CLINICAL DATA: Diarrhea and generalized stomach pain, nausea, and
vomiting for 2 weeks, former smoker, history hypertension, end-stage
renal disease on dialysis, type II diabetes mellitus

EXAM:
DG ABDOMEN ACUTE W/ 1V CHEST

[chest pa]
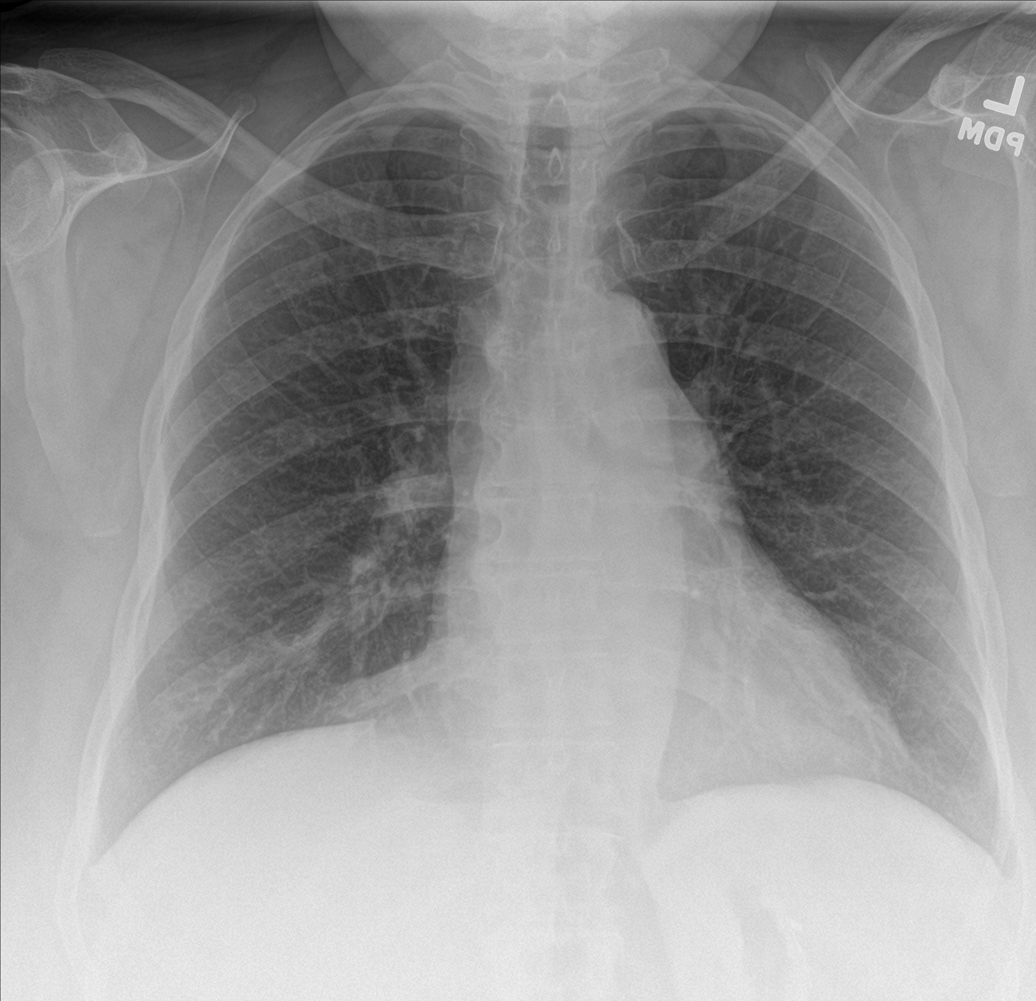

[Series 2: abdomen erect · 0.14mm/px · 2 of 2 slices shown]
[im 1/2]
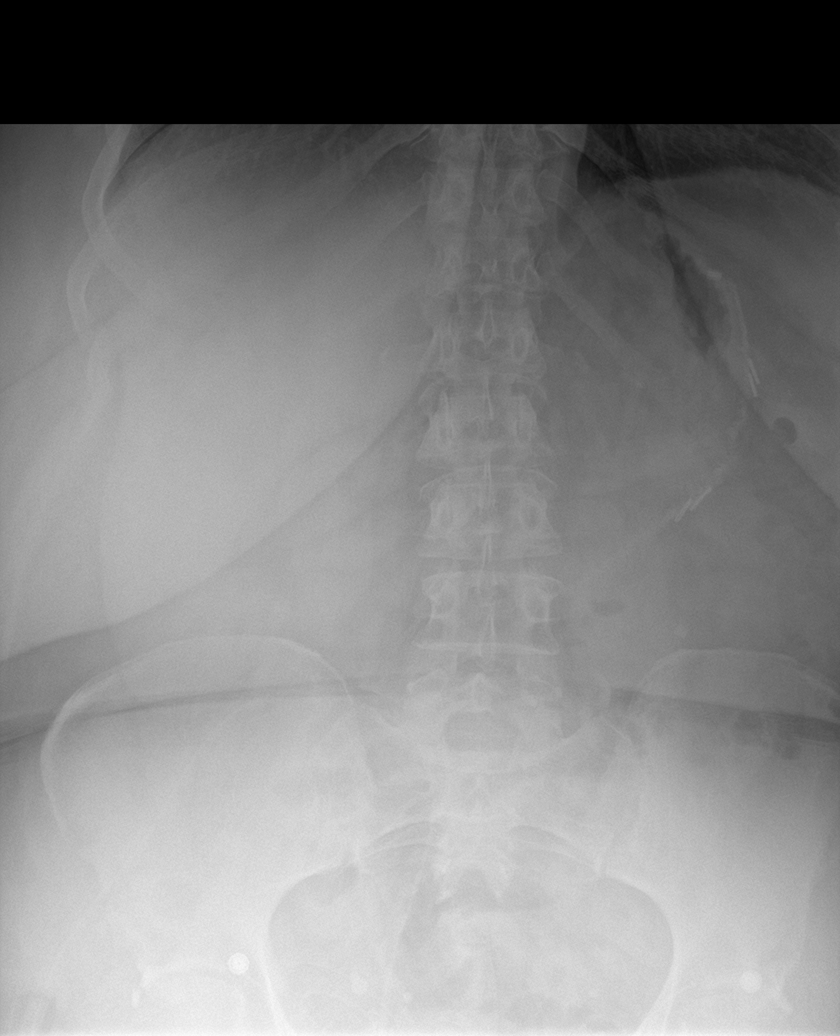
[im 2/2]
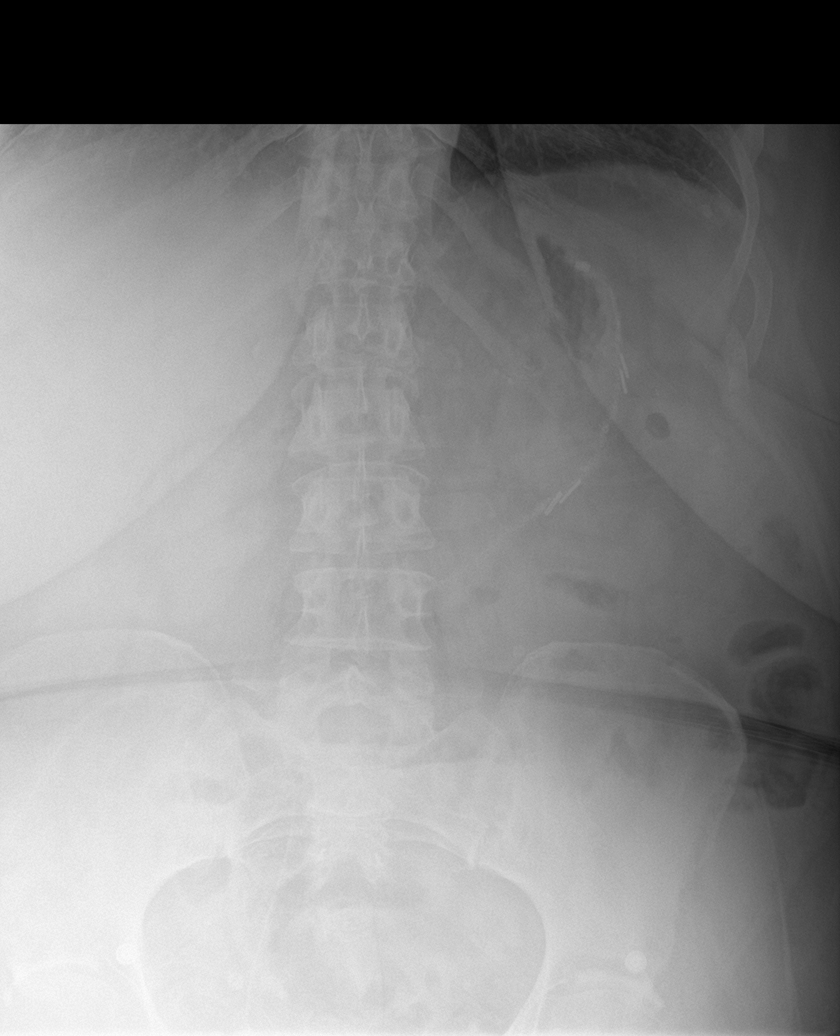

[abdomen supine (1 of 2)]
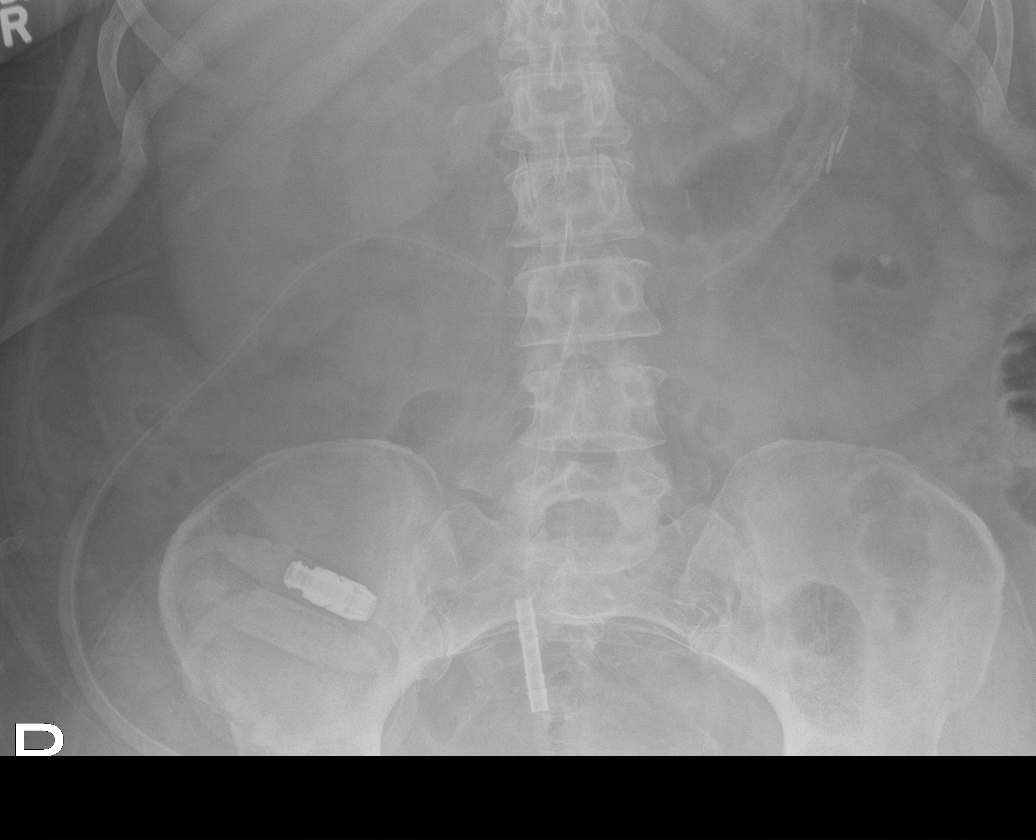

[abdomen supine (2 of 2)]
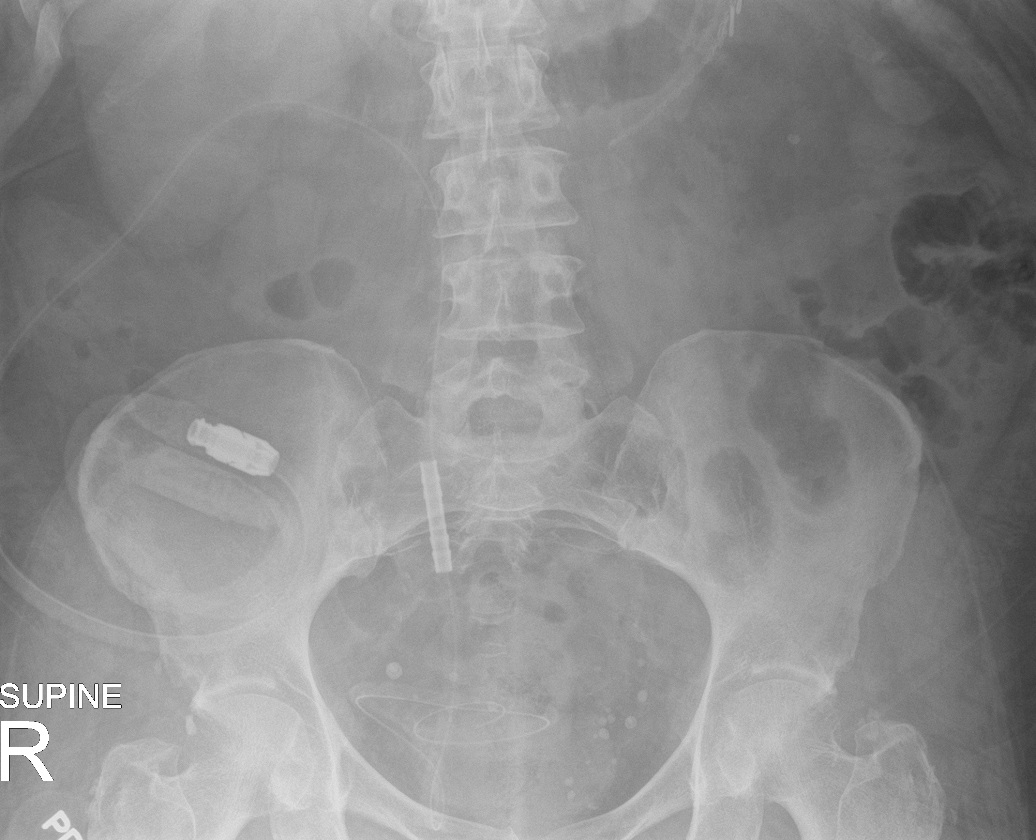

[5 of 5 positions shown; findings below may reference images not displayed]

FINDINGS: Upper normal heart size.

Mediastinal contours and pulmonary vascularity normal.

Atherosclerotic calcification at aorta

Lungs clear.

No pulmonary infiltrate, pleural effusion or pneumothorax.

Bones demineralized.

Surgical clips/staples from history of gastric sleeve resection.

Peritoneal dialysis catheter.

Bowel gas pattern normal.

No bowel dilatation bowel wall thickening, or free air.

Osseous structures unremarkable.

Calcification in LEFT mid abdomen is likely a tiny calcified lymph
node by prior CT.
IMPRESSION: Normal bowel gas pattern.

Clear lungs.

No acute abnormalities.

## 2020-08-05 DIAGNOSIS — I509 Heart failure, unspecified: Secondary | ICD-10-CM | POA: Diagnosis not present

## 2020-08-05 DIAGNOSIS — Z992 Dependence on renal dialysis: Secondary | ICD-10-CM | POA: Diagnosis not present

## 2020-08-05 DIAGNOSIS — I132 Hypertensive heart and chronic kidney disease with heart failure and with stage 5 chronic kidney disease, or end stage renal disease: Secondary | ICD-10-CM | POA: Diagnosis not present

## 2020-08-05 DIAGNOSIS — E8779 Other fluid overload: Secondary | ICD-10-CM | POA: Diagnosis not present

## 2020-08-05 DIAGNOSIS — D631 Anemia in chronic kidney disease: Secondary | ICD-10-CM | POA: Diagnosis not present

## 2020-08-05 DIAGNOSIS — N186 End stage renal disease: Secondary | ICD-10-CM | POA: Diagnosis not present

## 2020-08-05 DIAGNOSIS — E1122 Type 2 diabetes mellitus with diabetic chronic kidney disease: Secondary | ICD-10-CM | POA: Diagnosis not present

## 2020-08-05 DIAGNOSIS — Z23 Encounter for immunization: Secondary | ICD-10-CM | POA: Diagnosis not present

## 2020-08-05 DIAGNOSIS — D509 Iron deficiency anemia, unspecified: Secondary | ICD-10-CM | POA: Diagnosis not present

## 2020-08-06 ENCOUNTER — Inpatient Hospital Stay (HOSPITAL_COMMUNITY)
Admission: EM | Admit: 2020-08-06 | Discharge: 2020-08-19 | DRG: 296 | Disposition: E | Payer: Medicare HMO | Source: Ambulatory Visit | Attending: Pulmonary Disease | Admitting: Pulmonary Disease

## 2020-08-06 ENCOUNTER — Emergency Department (HOSPITAL_COMMUNITY): Payer: Medicare HMO

## 2020-08-06 ENCOUNTER — Ambulatory Visit (AMBULATORY_SURGERY_CENTER): Payer: Medicare HMO | Admitting: Gastroenterology

## 2020-08-06 ENCOUNTER — Encounter: Payer: Self-pay | Admitting: Gastroenterology

## 2020-08-06 ENCOUNTER — Other Ambulatory Visit: Payer: Self-pay

## 2020-08-06 ENCOUNTER — Inpatient Hospital Stay (HOSPITAL_COMMUNITY): Payer: Medicare HMO

## 2020-08-06 VITALS — BP 132/99 | HR 44 | Temp 95.9°F | Resp 12 | Ht 65.0 in | Wt 274.0 lb

## 2020-08-06 DIAGNOSIS — R001 Bradycardia, unspecified: Secondary | ICD-10-CM | POA: Diagnosis not present

## 2020-08-06 DIAGNOSIS — J690 Pneumonitis due to inhalation of food and vomit: Secondary | ICD-10-CM | POA: Diagnosis present

## 2020-08-06 DIAGNOSIS — E1122 Type 2 diabetes mellitus with diabetic chronic kidney disease: Secondary | ICD-10-CM | POA: Diagnosis present

## 2020-08-06 DIAGNOSIS — I471 Supraventricular tachycardia: Secondary | ICD-10-CM | POA: Diagnosis present

## 2020-08-06 DIAGNOSIS — J9601 Acute respiratory failure with hypoxia: Secondary | ICD-10-CM | POA: Diagnosis present

## 2020-08-06 DIAGNOSIS — Z9851 Tubal ligation status: Secondary | ICD-10-CM

## 2020-08-06 DIAGNOSIS — E8779 Other fluid overload: Secondary | ICD-10-CM | POA: Diagnosis not present

## 2020-08-06 DIAGNOSIS — K7290 Hepatic failure, unspecified without coma: Secondary | ICD-10-CM | POA: Diagnosis not present

## 2020-08-06 DIAGNOSIS — I348 Other nonrheumatic mitral valve disorders: Secondary | ICD-10-CM | POA: Diagnosis not present

## 2020-08-06 DIAGNOSIS — R680 Hypothermia, not associated with low environmental temperature: Secondary | ICD-10-CM | POA: Diagnosis not present

## 2020-08-06 DIAGNOSIS — D62 Acute posthemorrhagic anemia: Secondary | ICD-10-CM | POA: Diagnosis not present

## 2020-08-06 DIAGNOSIS — E785 Hyperlipidemia, unspecified: Secondary | ICD-10-CM | POA: Diagnosis present

## 2020-08-06 DIAGNOSIS — I255 Ischemic cardiomyopathy: Secondary | ICD-10-CM | POA: Diagnosis present

## 2020-08-06 DIAGNOSIS — I5032 Chronic diastolic (congestive) heart failure: Secondary | ICD-10-CM | POA: Diagnosis present

## 2020-08-06 DIAGNOSIS — Z885 Allergy status to narcotic agent status: Secondary | ICD-10-CM

## 2020-08-06 DIAGNOSIS — D631 Anemia in chronic kidney disease: Secondary | ICD-10-CM | POA: Diagnosis present

## 2020-08-06 DIAGNOSIS — E1151 Type 2 diabetes mellitus with diabetic peripheral angiopathy without gangrene: Secondary | ICD-10-CM | POA: Diagnosis present

## 2020-08-06 DIAGNOSIS — E1165 Type 2 diabetes mellitus with hyperglycemia: Secondary | ICD-10-CM | POA: Diagnosis not present

## 2020-08-06 DIAGNOSIS — E871 Hypo-osmolality and hyponatremia: Secondary | ICD-10-CM | POA: Diagnosis present

## 2020-08-06 DIAGNOSIS — I469 Cardiac arrest, cause unspecified: Secondary | ICD-10-CM | POA: Diagnosis not present

## 2020-08-06 DIAGNOSIS — S0003XA Contusion of scalp, initial encounter: Secondary | ICD-10-CM | POA: Diagnosis not present

## 2020-08-06 DIAGNOSIS — Z6841 Body Mass Index (BMI) 40.0 and over, adult: Secondary | ICD-10-CM | POA: Diagnosis not present

## 2020-08-06 DIAGNOSIS — Z515 Encounter for palliative care: Secondary | ICD-10-CM | POA: Diagnosis not present

## 2020-08-06 DIAGNOSIS — D6832 Hemorrhagic disorder due to extrinsic circulating anticoagulants: Secondary | ICD-10-CM | POA: Diagnosis not present

## 2020-08-06 DIAGNOSIS — Z20822 Contact with and (suspected) exposure to covid-19: Secondary | ICD-10-CM | POA: Diagnosis present

## 2020-08-06 DIAGNOSIS — I509 Heart failure, unspecified: Secondary | ICD-10-CM | POA: Diagnosis not present

## 2020-08-06 DIAGNOSIS — E874 Mixed disorder of acid-base balance: Secondary | ICD-10-CM | POA: Diagnosis present

## 2020-08-06 DIAGNOSIS — Z955 Presence of coronary angioplasty implant and graft: Secondary | ICD-10-CM

## 2020-08-06 DIAGNOSIS — F05 Delirium due to known physiological condition: Secondary | ICD-10-CM | POA: Diagnosis not present

## 2020-08-06 DIAGNOSIS — Z4682 Encounter for fitting and adjustment of non-vascular catheter: Secondary | ICD-10-CM | POA: Diagnosis not present

## 2020-08-06 DIAGNOSIS — R404 Transient alteration of awareness: Secondary | ICD-10-CM | POA: Diagnosis not present

## 2020-08-06 DIAGNOSIS — Z8249 Family history of ischemic heart disease and other diseases of the circulatory system: Secondary | ICD-10-CM

## 2020-08-06 DIAGNOSIS — J984 Other disorders of lung: Secondary | ICD-10-CM | POA: Diagnosis not present

## 2020-08-06 DIAGNOSIS — Z452 Encounter for adjustment and management of vascular access device: Secondary | ICD-10-CM

## 2020-08-06 DIAGNOSIS — I499 Cardiac arrhythmia, unspecified: Secondary | ICD-10-CM | POA: Diagnosis not present

## 2020-08-06 DIAGNOSIS — I120 Hypertensive chronic kidney disease with stage 5 chronic kidney disease or end stage renal disease: Secondary | ICD-10-CM | POA: Diagnosis not present

## 2020-08-06 DIAGNOSIS — D649 Anemia, unspecified: Secondary | ICD-10-CM

## 2020-08-06 DIAGNOSIS — S01512A Laceration without foreign body of oral cavity, initial encounter: Secondary | ICD-10-CM | POA: Diagnosis not present

## 2020-08-06 DIAGNOSIS — Z4659 Encounter for fitting and adjustment of other gastrointestinal appliance and device: Secondary | ICD-10-CM

## 2020-08-06 DIAGNOSIS — J9600 Acute respiratory failure, unspecified whether with hypoxia or hypercapnia: Secondary | ICD-10-CM

## 2020-08-06 DIAGNOSIS — Z23 Encounter for immunization: Secondary | ICD-10-CM | POA: Diagnosis not present

## 2020-08-06 DIAGNOSIS — R251 Tremor, unspecified: Secondary | ICD-10-CM | POA: Diagnosis present

## 2020-08-06 DIAGNOSIS — I313 Pericardial effusion (noninflammatory): Secondary | ICD-10-CM | POA: Diagnosis present

## 2020-08-06 DIAGNOSIS — Z7901 Long term (current) use of anticoagulants: Secondary | ICD-10-CM

## 2020-08-06 DIAGNOSIS — Z888 Allergy status to other drugs, medicaments and biological substances status: Secondary | ICD-10-CM

## 2020-08-06 DIAGNOSIS — R578 Other shock: Secondary | ICD-10-CM | POA: Diagnosis not present

## 2020-08-06 DIAGNOSIS — M109 Gout, unspecified: Secondary | ICD-10-CM | POA: Diagnosis present

## 2020-08-06 DIAGNOSIS — Z538 Procedure and treatment not carried out for other reasons: Secondary | ICD-10-CM | POA: Diagnosis not present

## 2020-08-06 DIAGNOSIS — R58 Hemorrhage, not elsewhere classified: Secondary | ICD-10-CM | POA: Diagnosis not present

## 2020-08-06 DIAGNOSIS — Z79899 Other long term (current) drug therapy: Secondary | ICD-10-CM

## 2020-08-06 DIAGNOSIS — G9341 Metabolic encephalopathy: Secondary | ICD-10-CM | POA: Diagnosis not present

## 2020-08-06 DIAGNOSIS — Z992 Dependence on renal dialysis: Secondary | ICD-10-CM | POA: Diagnosis not present

## 2020-08-06 DIAGNOSIS — Z833 Family history of diabetes mellitus: Secondary | ICD-10-CM

## 2020-08-06 DIAGNOSIS — J90 Pleural effusion, not elsewhere classified: Secondary | ICD-10-CM | POA: Diagnosis not present

## 2020-08-06 DIAGNOSIS — D509 Iron deficiency anemia, unspecified: Secondary | ICD-10-CM | POA: Diagnosis not present

## 2020-08-06 DIAGNOSIS — K7460 Unspecified cirrhosis of liver: Secondary | ICD-10-CM | POA: Diagnosis present

## 2020-08-06 DIAGNOSIS — I462 Cardiac arrest due to underlying cardiac condition: Secondary | ICD-10-CM | POA: Diagnosis not present

## 2020-08-06 DIAGNOSIS — Z66 Do not resuscitate: Secondary | ICD-10-CM | POA: Diagnosis not present

## 2020-08-06 DIAGNOSIS — K2970 Gastritis, unspecified, without bleeding: Secondary | ICD-10-CM | POA: Diagnosis not present

## 2020-08-06 DIAGNOSIS — R4182 Altered mental status, unspecified: Secondary | ICD-10-CM | POA: Diagnosis not present

## 2020-08-06 DIAGNOSIS — H903 Sensorineural hearing loss, bilateral: Secondary | ICD-10-CM | POA: Diagnosis present

## 2020-08-06 DIAGNOSIS — I517 Cardiomegaly: Secondary | ICD-10-CM | POA: Diagnosis not present

## 2020-08-06 DIAGNOSIS — K3189 Other diseases of stomach and duodenum: Secondary | ICD-10-CM | POA: Diagnosis not present

## 2020-08-06 DIAGNOSIS — N2581 Secondary hyperparathyroidism of renal origin: Secondary | ICD-10-CM | POA: Diagnosis present

## 2020-08-06 DIAGNOSIS — N186 End stage renal disease: Secondary | ICD-10-CM | POA: Diagnosis present

## 2020-08-06 DIAGNOSIS — I5181 Takotsubo syndrome: Secondary | ICD-10-CM | POA: Diagnosis not present

## 2020-08-06 DIAGNOSIS — I132 Hypertensive heart and chronic kidney disease with heart failure and with stage 5 chronic kidney disease, or end stage renal disease: Secondary | ICD-10-CM | POA: Diagnosis not present

## 2020-08-06 DIAGNOSIS — R0689 Other abnormalities of breathing: Secondary | ICD-10-CM | POA: Diagnosis not present

## 2020-08-06 DIAGNOSIS — E875 Hyperkalemia: Secondary | ICD-10-CM | POA: Diagnosis present

## 2020-08-06 DIAGNOSIS — G931 Anoxic brain damage, not elsewhere classified: Secondary | ICD-10-CM | POA: Diagnosis not present

## 2020-08-06 DIAGNOSIS — G4733 Obstructive sleep apnea (adult) (pediatric): Secondary | ICD-10-CM | POA: Diagnosis present

## 2020-08-06 DIAGNOSIS — I9589 Other hypotension: Secondary | ICD-10-CM | POA: Diagnosis present

## 2020-08-06 DIAGNOSIS — Z881 Allergy status to other antibiotic agents status: Secondary | ICD-10-CM

## 2020-08-06 DIAGNOSIS — I350 Nonrheumatic aortic (valve) stenosis: Secondary | ICD-10-CM | POA: Diagnosis not present

## 2020-08-06 DIAGNOSIS — J9690 Respiratory failure, unspecified, unspecified whether with hypoxia or hypercapnia: Secondary | ICD-10-CM | POA: Diagnosis not present

## 2020-08-06 DIAGNOSIS — Z781 Physical restraint status: Secondary | ICD-10-CM

## 2020-08-06 DIAGNOSIS — R69 Illness, unspecified: Secondary | ICD-10-CM | POA: Diagnosis not present

## 2020-08-06 DIAGNOSIS — I252 Old myocardial infarction: Secondary | ICD-10-CM

## 2020-08-06 DIAGNOSIS — I10 Essential (primary) hypertension: Secondary | ICD-10-CM | POA: Diagnosis not present

## 2020-08-06 DIAGNOSIS — Z87891 Personal history of nicotine dependence: Secondary | ICD-10-CM

## 2020-08-06 DIAGNOSIS — Z95810 Presence of automatic (implantable) cardiac defibrillator: Secondary | ICD-10-CM | POA: Diagnosis not present

## 2020-08-06 DIAGNOSIS — N250 Renal osteodystrophy: Secondary | ICD-10-CM | POA: Diagnosis not present

## 2020-08-06 DIAGNOSIS — E1129 Type 2 diabetes mellitus with other diabetic kidney complication: Secondary | ICD-10-CM | POA: Diagnosis not present

## 2020-08-06 DIAGNOSIS — I2510 Atherosclerotic heart disease of native coronary artery without angina pectoris: Secondary | ICD-10-CM | POA: Diagnosis present

## 2020-08-06 LAB — CBC
HCT: 30.5 % — ABNORMAL LOW (ref 36.0–46.0)
Hemoglobin: 9.6 g/dL — ABNORMAL LOW (ref 12.0–15.0)
MCH: 30.8 pg (ref 26.0–34.0)
MCHC: 31.5 g/dL (ref 30.0–36.0)
MCV: 97.8 fL (ref 80.0–100.0)
Platelets: 240 10*3/uL (ref 150–400)
RBC: 3.12 MIL/uL — ABNORMAL LOW (ref 3.87–5.11)
RDW: 16.3 % — ABNORMAL HIGH (ref 11.5–15.5)
WBC: 7.9 10*3/uL (ref 4.0–10.5)
nRBC: 0 % (ref 0.0–0.2)

## 2020-08-06 LAB — I-STAT ARTERIAL BLOOD GAS, ED
Acid-Base Excess: 8 mmol/L — ABNORMAL HIGH (ref 0.0–2.0)
Bicarbonate: 33.9 mmol/L — ABNORMAL HIGH (ref 20.0–28.0)
Calcium, Ion: 1.17 mmol/L (ref 1.15–1.40)
HCT: 31 % — ABNORMAL LOW (ref 36.0–46.0)
Hemoglobin: 10.5 g/dL — ABNORMAL LOW (ref 12.0–15.0)
O2 Saturation: 100 %
Patient temperature: 97
Potassium: 5.1 mmol/L (ref 3.5–5.1)
Sodium: 132 mmol/L — ABNORMAL LOW (ref 135–145)
TCO2: 35 mmol/L — ABNORMAL HIGH (ref 22–32)
pCO2 arterial: 49.2 mmHg — ABNORMAL HIGH (ref 32.0–48.0)
pH, Arterial: 7.443 (ref 7.350–7.450)
pO2, Arterial: 226 mmHg — ABNORMAL HIGH (ref 83.0–108.0)

## 2020-08-06 LAB — COMPREHENSIVE METABOLIC PANEL
ALT: 78 U/L — ABNORMAL HIGH (ref 0–44)
AST: 83 U/L — ABNORMAL HIGH (ref 15–41)
Albumin: 3.5 g/dL (ref 3.5–5.0)
Alkaline Phosphatase: 72 U/L (ref 38–126)
Anion gap: 14 (ref 5–15)
BUN: 28 mg/dL — ABNORMAL HIGH (ref 6–20)
CO2: 29 mmol/L (ref 22–32)
Calcium: 9.4 mg/dL (ref 8.9–10.3)
Chloride: 90 mmol/L — ABNORMAL LOW (ref 98–111)
Creatinine, Ser: 7.78 mg/dL — ABNORMAL HIGH (ref 0.44–1.00)
GFR, Estimated: 6 mL/min — ABNORMAL LOW (ref >60)
Glucose, Bld: 168 mg/dL — ABNORMAL HIGH (ref 70–99)
Potassium: 5.2 mmol/L — ABNORMAL HIGH (ref 3.5–5.1)
Sodium: 133 mmol/L — ABNORMAL LOW (ref 135–145)
Total Bilirubin: 0.7 mg/dL (ref 0.3–1.2)
Total Protein: 7.1 g/dL (ref 6.5–8.1)

## 2020-08-06 LAB — URINALYSIS, ROUTINE W REFLEX MICROSCOPIC
Bilirubin Urine: NEGATIVE
Glucose, UA: 50 mg/dL — AB
Hgb urine dipstick: NEGATIVE
Ketones, ur: NEGATIVE mg/dL
Leukocytes,Ua: NEGATIVE
Nitrite: NEGATIVE
Protein, ur: 100 mg/dL — AB
Specific Gravity, Urine: 1.005 (ref 1.005–1.030)
pH: 9 — ABNORMAL HIGH (ref 5.0–8.0)

## 2020-08-06 LAB — CREATININE, SERUM
Creatinine, Ser: 8.09 mg/dL — ABNORMAL HIGH (ref 0.44–1.00)
GFR, Estimated: 5 mL/min — ABNORMAL LOW (ref >60)

## 2020-08-06 LAB — RESP PANEL BY RT-PCR (FLU A&B, COVID) ARPGX2
Influenza A by PCR: NEGATIVE
Influenza B by PCR: NEGATIVE
SARS Coronavirus 2 by RT PCR: NEGATIVE

## 2020-08-06 LAB — CBC WITH DIFFERENTIAL/PLATELET
Abs Immature Granulocytes: 0.12 10*3/uL — ABNORMAL HIGH (ref 0.00–0.07)
Basophils Absolute: 0 10*3/uL (ref 0.0–0.1)
Basophils Relative: 0 %
Eosinophils Absolute: 0.1 10*3/uL (ref 0.0–0.5)
Eosinophils Relative: 2 %
HCT: 34.2 % — ABNORMAL LOW (ref 36.0–46.0)
Hemoglobin: 10.7 g/dL — ABNORMAL LOW (ref 12.0–15.0)
Immature Granulocytes: 2 %
Lymphocytes Relative: 22 %
Lymphs Abs: 1.4 10*3/uL (ref 0.7–4.0)
MCH: 30.8 pg (ref 26.0–34.0)
MCHC: 31.3 g/dL (ref 30.0–36.0)
MCV: 98.6 fL (ref 80.0–100.0)
Monocytes Absolute: 0.3 10*3/uL (ref 0.1–1.0)
Monocytes Relative: 5 %
Neutro Abs: 4.2 10*3/uL (ref 1.7–7.7)
Neutrophils Relative %: 69 %
Platelets: 243 10*3/uL (ref 150–400)
RBC: 3.47 MIL/uL — ABNORMAL LOW (ref 3.87–5.11)
RDW: 16.2 % — ABNORMAL HIGH (ref 11.5–15.5)
WBC: 6.1 10*3/uL (ref 4.0–10.5)
nRBC: 0.3 % — ABNORMAL HIGH (ref 0.0–0.2)

## 2020-08-06 LAB — GLUCOSE, CAPILLARY
Glucose-Capillary: 109 mg/dL — ABNORMAL HIGH (ref 70–99)
Glucose-Capillary: 140 mg/dL — ABNORMAL HIGH (ref 70–99)
Glucose-Capillary: 142 mg/dL — ABNORMAL HIGH (ref 70–99)

## 2020-08-06 LAB — PHOSPHORUS
Phosphorus: 6.4 mg/dL — ABNORMAL HIGH (ref 2.5–4.6)
Phosphorus: 5.7 mg/dL — ABNORMAL HIGH (ref 2.5–4.6)
Phosphorus: 5.6 mg/dL — ABNORMAL HIGH (ref 2.5–4.6)

## 2020-08-06 LAB — HEMOGLOBIN A1C
Hgb A1c MFr Bld: 5.5 % (ref 4.8–5.6)
Mean Plasma Glucose: 111.15 mg/dL

## 2020-08-06 LAB — I-STAT CHEM 8, ED
BUN: 32 mg/dL — ABNORMAL HIGH (ref 6–20)
Calcium, Ion: 1.07 mmol/L — ABNORMAL LOW (ref 1.15–1.40)
Chloride: 94 mmol/L — ABNORMAL LOW (ref 98–111)
Creatinine, Ser: 7.6 mg/dL — ABNORMAL HIGH (ref 0.44–1.00)
Glucose, Bld: 144 mg/dL — ABNORMAL HIGH (ref 70–99)
HCT: 34 % — ABNORMAL LOW (ref 36.0–46.0)
Hemoglobin: 11.6 g/dL — ABNORMAL LOW (ref 12.0–15.0)
Potassium: 5.1 mmol/L (ref 3.5–5.1)
Sodium: 131 mmol/L — ABNORMAL LOW (ref 135–145)
TCO2: 29 mmol/L (ref 22–32)

## 2020-08-06 LAB — MAGNESIUM
Magnesium: 2.5 mg/dL — ABNORMAL HIGH (ref 1.7–2.4)
Magnesium: 2.4 mg/dL (ref 1.7–2.4)
Magnesium: 2.4 mg/dL (ref 1.7–2.4)

## 2020-08-06 LAB — RAPID URINE DRUG SCREEN, HOSP PERFORMED
Amphetamines: NOT DETECTED
Barbiturates: NOT DETECTED
Benzodiazepines: NOT DETECTED
Cocaine: NOT DETECTED
Opiates: NOT DETECTED
Tetrahydrocannabinol: NOT DETECTED

## 2020-08-06 LAB — TROPONIN I (HIGH SENSITIVITY)
Troponin I (High Sensitivity): 18 ng/L — ABNORMAL HIGH (ref <18)
Troponin I (High Sensitivity): 94 ng/L — ABNORMAL HIGH (ref <18)

## 2020-08-06 LAB — PROTIME-INR
INR: 1.2 (ref 0.8–1.2)
Prothrombin Time: 15.3 seconds — ABNORMAL HIGH (ref 11.4–15.2)

## 2020-08-06 LAB — MRSA NEXT GEN BY PCR, NASAL: MRSA by PCR Next Gen: NOT DETECTED

## 2020-08-06 LAB — LACTIC ACID, PLASMA
Lactic Acid, Venous: 3.3 mmol/L (ref 0.5–1.9)
Lactic Acid, Venous: 1.4 mmol/L (ref 0.5–1.9)

## 2020-08-06 LAB — HIV ANTIBODY (ROUTINE TESTING W REFLEX): HIV Screen 4th Generation wRfx: NONREACTIVE

## 2020-08-06 MED ORDER — CARVEDILOL 25 MG PO TABS
25.0000 mg | ORAL_TABLET | Freq: Two times a day (BID) | ORAL | Status: DC
  Administered 2020-08-06 – 2020-08-07 (×2): 25 mg
  Filled 2020-08-06 (×3): qty 1

## 2020-08-06 MED ORDER — POLYETHYLENE GLYCOL 3350 17 G PO PACK
17.0000 g | PACK | Freq: Every day | ORAL | Status: DC | PRN
Start: 2020-08-06 — End: 2020-08-07

## 2020-08-06 MED ORDER — VITAL HIGH PROTEIN PO LIQD
1000.0000 mL | ORAL | Status: DC
  Administered 2020-08-06: 1000 mL

## 2020-08-06 MED ORDER — HEPARIN SODIUM (PORCINE) 5000 UNIT/ML IJ SOLN
5000.0000 [IU] | Freq: Three times a day (TID) | INTRAMUSCULAR | Status: DC
  Administered 2020-08-06 – 2020-08-08 (×5): 5000 [IU] via SUBCUTANEOUS
  Filled 2020-08-06 (×5): qty 1

## 2020-08-06 MED ORDER — FENTANYL BOLUS VIA INFUSION
50.0000 ug | INTRAVENOUS | Status: DC | PRN
  Filled 2020-08-06: qty 100

## 2020-08-06 MED ORDER — LACTATED RINGERS IV SOLN: INTRAVENOUS | Status: DC

## 2020-08-06 MED ORDER — POLYETHYLENE GLYCOL 3350 17 G PO PACK: 17.0000 g | PACK | Freq: Every day | ORAL | Status: DC

## 2020-08-06 MED ORDER — DOCUSATE SODIUM 50 MG/5ML PO LIQD
100.0000 mg | Freq: Two times a day (BID) | ORAL | Status: DC
  Administered 2020-08-06 – 2020-08-14 (×12): 100 mg
  Filled 2020-08-06 (×13): qty 10

## 2020-08-06 MED ORDER — ORAL CARE MOUTH RINSE
15.0000 mL | OROMUCOSAL | Status: DC
  Administered 2020-08-06 – 2020-08-14 (×74): 15 mL via OROMUCOSAL

## 2020-08-06 MED ORDER — INSULIN ASPART 100 UNIT/ML IJ SOLN
0.0000 [IU] | INTRAMUSCULAR | Status: DC
  Administered 2020-08-06 – 2020-08-07 (×4): 2 [IU] via SUBCUTANEOUS
  Administered 2020-08-07 (×2): 3 [IU] via SUBCUTANEOUS
  Administered 2020-08-08: 2 [IU] via SUBCUTANEOUS
  Administered 2020-08-08 (×5): 3 [IU] via SUBCUTANEOUS
  Administered 2020-08-09: 2 [IU] via SUBCUTANEOUS
  Administered 2020-08-09: 3 [IU] via SUBCUTANEOUS
  Administered 2020-08-09 (×2): 2 [IU] via SUBCUTANEOUS
  Administered 2020-08-09: 3 [IU] via SUBCUTANEOUS
  Administered 2020-08-09: 2 [IU] via SUBCUTANEOUS
  Administered 2020-08-10: 3 [IU] via SUBCUTANEOUS
  Administered 2020-08-10 (×2): 2 [IU] via SUBCUTANEOUS
  Administered 2020-08-10 – 2020-08-11 (×7): 3 [IU] via SUBCUTANEOUS
  Administered 2020-08-11 (×2): 2 [IU] via SUBCUTANEOUS
  Administered 2020-08-12 (×2): 3 [IU] via SUBCUTANEOUS
  Administered 2020-08-12: 5 [IU] via SUBCUTANEOUS
  Administered 2020-08-12: 2 [IU] via SUBCUTANEOUS
  Administered 2020-08-12: 3 [IU] via SUBCUTANEOUS
  Administered 2020-08-12: 2 [IU] via SUBCUTANEOUS
  Administered 2020-08-13: 3 [IU] via SUBCUTANEOUS
  Administered 2020-08-13: 5 [IU] via SUBCUTANEOUS
  Administered 2020-08-13: 3 [IU] via SUBCUTANEOUS
  Administered 2020-08-13: 5 [IU] via SUBCUTANEOUS
  Administered 2020-08-13 – 2020-08-14 (×3): 3 [IU] via SUBCUTANEOUS
  Administered 2020-08-14: 2 [IU] via SUBCUTANEOUS
  Administered 2020-08-14 – 2020-08-15 (×3): 3 [IU] via SUBCUTANEOUS
  Administered 2020-08-15: 2 [IU] via SUBCUTANEOUS
  Administered 2020-08-15 (×2): 3 [IU] via SUBCUTANEOUS

## 2020-08-06 MED ORDER — SODIUM CHLORIDE 0.9 % IV SOLN: 500.0000 mL | INTRAVENOUS | Status: AC

## 2020-08-06 MED ORDER — CHLORHEXIDINE GLUCONATE CLOTH 2 % EX PADS
6.0000 | MEDICATED_PAD | Freq: Every day | CUTANEOUS | Status: DC
  Administered 2020-08-06 – 2020-08-15 (×10): 6 via TOPICAL

## 2020-08-06 MED ORDER — FENTANYL CITRATE (PF) 100 MCG/2ML IJ SOLN: 50.0000 ug | Freq: Once | INTRAMUSCULAR | Status: DC

## 2020-08-06 MED ORDER — ROSUVASTATIN CALCIUM 20 MG PO TABS
20.0000 mg | ORAL_TABLET | Freq: Every day | ORAL | Status: DC
  Administered 2020-08-07 – 2020-08-15 (×9): 20 mg
  Filled 2020-08-06 (×9): qty 1

## 2020-08-06 MED ORDER — GABAPENTIN 250 MG/5ML PO SOLN
100.0000 mg | Freq: Two times a day (BID) | ORAL | Status: DC
  Administered 2020-08-06: 100 mg
  Filled 2020-08-06 (×2): qty 2

## 2020-08-06 MED ORDER — TICAGRELOR 90 MG PO TABS
90.0000 mg | ORAL_TABLET | Freq: Two times a day (BID) | ORAL | Status: DC
  Administered 2020-08-06 – 2020-08-15 (×17): 90 mg
  Filled 2020-08-06 (×19): qty 1

## 2020-08-06 MED ORDER — AMPICILLIN-SULBACTAM SODIUM 3 (2-1) G IJ SOLR
3.0000 g | INTRAMUSCULAR | Status: AC
  Administered 2020-08-06 – 2020-08-12 (×7): 3 g via INTRAVENOUS
  Filled 2020-08-06 (×7): qty 8

## 2020-08-06 MED ORDER — CHLORHEXIDINE GLUCONATE 0.12% ORAL RINSE (MEDLINE KIT)
15.0000 mL | Freq: Two times a day (BID) | OROMUCOSAL | Status: DC
  Administered 2020-08-06 – 2020-08-14 (×16): 15 mL via OROMUCOSAL

## 2020-08-06 MED ORDER — FENTANYL 2500MCG IN NS 250ML (10MCG/ML) PREMIX INFUSION
50.0000 ug/h | INTRAVENOUS | Status: DC
  Administered 2020-08-06: 50 ug/h via INTRAVENOUS
  Filled 2020-08-06: qty 250

## 2020-08-06 MED ORDER — PROSOURCE TF PO LIQD
45.0000 mL | Freq: Two times a day (BID) | ORAL | Status: DC
  Administered 2020-08-07: 45 mL
  Filled 2020-08-06: qty 45

## 2020-08-06 MED ORDER — CHLORHEXIDINE GLUCONATE CLOTH 2 % EX PADS
6.0000 | MEDICATED_PAD | Freq: Every day | CUTANEOUS | Status: DC
  Administered 2020-08-08 (×2): 6 via TOPICAL

## 2020-08-06 MED ORDER — HYDRALAZINE HCL 20 MG/ML IJ SOLN
10.0000 mg | INTRAMUSCULAR | Status: DC | PRN
  Administered 2020-08-06: 10 mg via INTRAVENOUS
  Administered 2020-08-07: 20 mg via INTRAVENOUS
  Filled 2020-08-06 (×2): qty 1

## 2020-08-06 MED ORDER — DOCUSATE SODIUM 100 MG PO CAPS: 100.0000 mg | ORAL_CAPSULE | Freq: Two times a day (BID) | ORAL | Status: DC | PRN

## 2020-08-06 NOTE — Progress Notes (Signed)
  Echocardiogram 2D Echocardiogram has been attempted. Nurse request do at later time. Patient leaving floor.  Brenda Davis 08/13/2020, 3:46 PM

## 2020-08-06 NOTE — Progress Notes (Signed)
[place] Progress Note Patient Name: Brenda Davis DOB: 1965/09/23 MRN: [id number]   Date of Service  07/23/2020  HPI/Events of Note  Patient on Enteral feeding but progress note and MAR indicate NPO status, sub-optimal blood pressure control.  eICU Interventions  Enteral nutrition discontinued, PRN Hydralazine ordered for elevated BP.        Kerry Kass Kriss Ishler 08/03/2020, 8:08 PM

## 2020-08-06 NOTE — Progress Notes (Signed)
Called to room to assist during endoscopic procedure.  Patient ID and intended procedure confirmed with present staff. Received instructions for my participation in the procedure from the performing physician.  

## 2020-08-06 NOTE — ED Notes (Signed)
Help get patient cleaned up pulled up in bed patient is resting

## 2020-08-06 NOTE — Consult Note (Signed)
[facility] Renal Consultation Note    Indication for Consultation:  Management of ESRD/hemodialysis; anemia, hypertension/volume and secondary hyperparathyroidism  HPI: Brenda Davis is a 55 y.o. female with a PMH significant for CAD s/p PCI and DES, HTN, DM, HLD, obesity, OSA on CPAP, covid-19 PNA x 2 episodes, and ESRD on home HD 4 days per week who underwent outpatient EGD and colonoscopy to evaluate persistent N/V and after procedure suffered a PEA arrest.  CPR was initiated and ROSC was achieved after 10 minutes of ACLS protocol.  She was intubated en route to hospital by EMS.  We were consulted to provide dialysis during her hospitalization.  She did receive dialysis yesterday.  She is currently intubated and sedated so the HPI was obtained by discussion with admitting physicians and review of the EMR.   Past Medical History:  Diagnosis Date   Anemia    CAD (coronary artery disease)    a. cath in 02/2018 showing moderate 3-vessel CAD with 45% mid-LADm 55% LCx and 65% RCA stenosis which was not significant by FFR   ESRD on dialysis ([place])    Gout    HCAP (healthcare-associated pneumonia) 05/06/2016   History of blood transfusion    "related to surgery"   Hyperlipidemia    Hypertension    Morbid obesity ([place])    Pain    LEFT SHOULDER PAIN - WAS SEEN AT AN URGENT CARE - GIVEN SLING FOR COMFORT AND TOLD ROM AS TOLERATED.   Palpitations 09/24/2016   Peritonitis, dialysis-associated (HCC)    SVT (supraventricular tachycardia) ([place]) 02/28/2020   Type II diabetes mellitus ([place])    "gastric sleeve OR corrected this" (05/06/2016)   Past Surgical History:  Procedure Laterality Date   AV FISTULA PLACEMENT Left 01/03/2015   Procedure: BRACHIAL CEPHALIC ARTERIOVENOUS  FISTULA CREATION LEFT ARM;  Surgeon: Angelia Mould, MD;  Location: [facility];  Service: Vascular;  Laterality: Left;   CARPAL TUNNEL RELEASE Right    [place]   09/14/2017   CORONARY STENT INTERVENTION N/A 01/17/2019   Procedure: CORONARY STENT INTERVENTION;  Surgeon: Jettie Booze, MD;  Location: [facility];  Service: Cardiovascular;  Laterality: N/A;   ESOPHAGOGASTRODUODENOSCOPY N/A 09/04/2012   Procedure: ESOPHAGOGASTRODUODENOSCOPY (EGD);  Surgeon: Shann Medal, MD;  Location: [facility];  Service: General;  Laterality: N/A;  PF   ESOPHAGOGASTRODUODENOSCOPY (EGD) WITH ESOPHAGEAL DILATION N/A 09/29/2012   Procedure: ESOPHAGOGASTRODUODENOSCOPY (EGD) WITH ESOPHAGEAL DILATION;  Surgeon: Milus Banister, MD;  [facility]: [facility];  Service: Endoscopy;  Laterality: N/A;   FEMORAL ARTERY EXPLORATION Right 01/17/2019   Procedure: Exploration Right Groin with Primary Closure or Arteriotomy Site; Washout of Retroperitoneal Hematoma;  Surgeon: Waynetta Sandy, MD;  Location: [facility];  Service: Vascular;  Laterality: Right;   INSERTION OF DIALYSIS CATHETER N/A 01/03/2015   Procedure: INSERTION OF DIALYSIS CATHETER RIGHT INTERNAL JUGULAR;  Surgeon: Angelia Mould, MD;  Location: [place] [facility];  Service: Vascular;  Laterality: N/A;   LAPAROSCOPIC GASTRIC SLEEVE RESECTION N/A 07/19/2012   Procedure: LAPAROSCOPIC SLEEVE GASTRECTOMY with EGD;  Surgeon: Madilyn Hook, DO;  [facility]: [facility];  Service: General;  Laterality: N/A;  laparoscopic sleeve gastrectomy with EGD   LEFT HEART CATH AND CORONARY ANGIOGRAPHY N/A 02/25/2018   Procedure: LEFT HEART CATH AND CORONARY ANGIOGRAPHY;  Surgeon: Leonie Man, MD;  Location: [facility];  Service: Cardiovascular;  Laterality: N/A;   LEFT HEART CATH AND CORONARY ANGIOGRAPHY N/A 06/20/2018  Procedure: LEFT HEART CATH AND CORONARY ANGIOGRAPHY;  Surgeon: Nelva Bush, MD;  Location: [facility];  Service: Cardiovascular;  Laterality: N/A;   LEFT HEART CATH AND CORONARY ANGIOGRAPHY N/A 01/16/2019   Procedure: LEFT HEART CATH AND CORONARY ANGIOGRAPHY;  Surgeon: Jettie Booze, MD;   Location: [facility];  Service: Cardiovascular;  Laterality: N/A;   LEFT HEART CATH AND CORONARY ANGIOGRAPHY N/A 03/03/2019   Procedure: LEFT HEART CATH AND CORONARY ANGIOGRAPHY;  Surgeon: Jettie Booze, MD;  Location: [facility];  Service: Cardiovascular;  Laterality: N/A;   PERIPHERAL VASCULAR BALLOON ANGIOPLASTY Left 08/18/2019   Procedure: PERIPHERAL VASCULAR BALLOON ANGIOPLASTY;  Surgeon: Marty Heck, MD;  Location: [facility];  Service: Cardiovascular;  Laterality: Left;  arm fistula   PERIPHERAL VASCULAR CATHETERIZATION Left 05/23/2015   Procedure: Nolon Stalls;  Surgeon: Conrad Rincon, MD;  Location: [facility];  Service: Cardiovascular;  Laterality: Left;  upper aRM   PERIPHERAL VASCULAR CATHETERIZATION Left 05/23/2015   Procedure: Peripheral Vascular Balloon Angioplasty;  Surgeon: Conrad Bradford, MD;  Location: [facility];  Service: Cardiovascular;  Laterality: Left;  av fistula   TUBAL LIGATION  1993   UPPER GI ENDOSCOPY N/A 07/19/2012   Procedure: UPPER GI ENDOSCOPY;  Surgeon: Madilyn Hook, DO;  [facility];  Service: General;  Laterality: N/A;   Family History:   Family History  Problem Relation Age of Onset   Hypertension Mother    Mitral valve prolapse Mother    Diabetes Father    Arthritis Other    Hyperlipidemia Other    Cancer Neg Hx    Heart disease Neg Hx    Kidney disease Neg Hx    Stroke Neg Hx    Social History:  reports that she quit smoking about 19 years ago. Her smoking use included cigarettes. She has a 16.00 pack-year smoking history. She has never used smokeless tobacco. She reports that she does not drink alcohol and does not use drugs. Allergies  Allergen Reactions   Amlodipine Swelling   Atorvastatin     Elevated LFT's   Clonidine Derivatives Swelling    Limbs swell   Doxycycline Nausea And Vomiting    "I threw up for 3 hours"   Welchol [Colesevelam Hcl] Nausea Only   Prior to Admission  medications   Medication Sig Start Date End Date Taking? Authorizing Provider  acetaminophen (TYLENOL) 500 MG tablet Take 1,000 mg by mouth every 6 (six) hours as needed for moderate pain or headache.    [provider]  B Complex-C-Biotin-D-FA (DIALYVITE 800 PLUS D) 800 MCG WAFR Take by mouth. 09/04/19   [provider]  calcitRIOL (ROCALTROL) 0.5 MCG capsule Take 1.5 mcg by mouth daily.  04/29/19   [provider]  calcium acetate (PHOSLO) 667 MG capsule Take 667-2,001 mg by mouth See admin instructions. Take 2001 mg with each meal and 667 mg with each snack 06/02/18   [provider]  carvedilol (COREG) 25 MG tablet Take 1 tablet (25 mg total) by mouth 2 (two) times daily. 04/23/20   Skeet Latch, MD  cinacalcet (SENSIPAR) 30 MG tablet TAKE 2 TABLETS BY MOUTH ON MON, WED & FRI AT BEDTIME 04/11/20   [provider]  diltiazem (CARDIZEM CD) 120 MG 24 hr capsule Take 1 capsule (120 mg total) by mouth daily. 02/28/20 05/28/20  Skeet Latch, MD  ferric citrate (AURYXIA) 1 GM 210 MG(Fe) tablet Take 2 tablets (420 mg  total) by mouth 3 (three) times daily with meals. 01/23/19   Daune Perch, NP  gabapentin (NEURONTIN) 100 MG capsule Take 100 mg by mouth 2 (two) times daily. 10/31/18   [provider]  iron sucrose in sodium chloride 0.9 % 100 mL Iron Sucrose (Venofer) 03/01/20   [provider]  isosorbide mononitrate (IMDUR) 30 MG 24 hr tablet Take 1 tablet (30 mg total) by mouth daily. 04/24/20 07/23/20  Deberah Pelton, NP  mupirocin ointment (BACTROBAN) 2 % Apply topically. Patient not taking: Reported on 08/14/2020 09/01/19   [provider]  nitroGLYCERIN (NITROSTAT) 0.4 MG SL tablet Place 1 tablet (0.4 mg total) under the tongue every 5 (five) minutes as needed for chest pain. 07/26/20   Skeet Latch, MD  ondansetron (ZOFRAN) 4 MG tablet Take 1 tablet (4 mg total) by mouth every 8 (eight) hours as needed for nausea or  vomiting. Patient not taking: Reported on 8/56/3149 07/20/61   Delora Fuel, MD  rosuvastatin (CRESTOR) 20 MG tablet Take 1 tablet (20 mg total) by mouth daily. 04/23/20   Skeet Latch, MD  thiamine (VITAMIN B-1) 50 MG tablet Take 1 tablet (50 mg total) by mouth daily. Patient not taking: Reported on 08/14/2020 03/01/20   [name], MD  ticagrelor (BRILINTA) 90 MG TABS tablet Take 1 tablet (90 mg total) by mouth 2 (two) times daily. 05/28/20   Skeet Latch, MD   Current Facility-Administered Medications  Medication Dose Route Frequency Provider Last Rate Last Admin   0.9 %  sodium chloride infusion  500 mL Intravenous Continuous Cirigliano, Vito V, DO       Ampicillin-Sulbactam (UNASYN) 3 g in sodium chloride 0.9 % 100 mL IVPB  3 g Intravenous Q24H Bertis Ruddy, RPH 200 mL/hr at 08/05/2020 1539 3 g at 08/11/2020 1539   carvedilol (COREG) tablet 25 mg  25 mg Per Tube BID Erick Colace, NP       docusate (COLACE) 50 MG/5ML liquid 100 mg  100 mg Per Tube BID Charlesetta Shanks, MD       docusate sodium (COLACE) capsule 100 mg  100 mg Oral BID PRN Gerald Leitz D, NP       feeding supplement (PROSource TF) liquid 45 mL  45 mL Per Tube BID Harris, Loree Fee D, NP       feeding supplement (VITAL HIGH PROTEIN) liquid 1,000 mL  1,000 mL Per Tube Q24H Harris, Whitney D, NP       fentaNYL (SUBLIMAZE) bolus via infusion 50-100 mcg  50-100 mcg Intravenous Q15 min PRN Charlesetta Shanks, MD       fentaNYL (SUBLIMAZE) injection 50 mcg  50 mcg Intravenous Once Charlesetta Shanks, MD       fentaNYL 2568mg in NS 2567m(1025mml) infusion-PREMIX  50-200 mcg/hr Intravenous Continuous PfeCharlesetta ShanksD   Stopped at 07/31/2020 1354   gabapentin (NEURONTIN) 250 MG/5ML solution 100 mg  100 mg Per Tube Q12H BabErick ColaceP       heparin injection 5,000 Units  5,000 Units Subcutaneous Q8H Harris, Whitney D, NP       insulin aspart (novoLOG) injection 0-15 Units  0-15 Units Subcutaneous Q4H Harris, Whitney D, NP        lactated ringers infusion   Intravenous Continuous Pfeiffer, Marcy, MD       polyethylene glycol (MIRALAX / GLYCOLAX) packet 17 g  17 g Oral Daily PRN HarGerald Leitz NP       [START ON 08/07/2020] rosuvastatin (CRESTOR) tablet  20 mg  20 mg Per Tube Daily Erick Colace, NP       ticagrelor [facility]) tablet 90 mg  90 mg Per Tube BID Erick Colace, NP       Current Outpatient Medications  Medication Sig Dispense Refill   acetaminophen (TYLENOL) 500 MG tablet Take 1,000 mg by mouth every 6 (six) hours as needed for moderate pain or headache.     B Complex-C-Biotin-D-FA (DIALYVITE 800 PLUS D) 800 MCG WAFR Take by mouth.     calcitRIOL (ROCALTROL) 0.5 MCG capsule Take 1.5 mcg by mouth daily.      calcium acetate (PHOSLO) 667 MG capsule Take 667-2,001 mg by mouth See admin instructions. Take 2001 mg with each meal and 667 mg with each snack     carvedilol (COREG) 25 MG tablet Take 1 tablet (25 mg total) by mouth 2 (two) times daily. 180 tablet 3   cinacalcet (SENSIPAR) 30 MG tablet TAKE 2 TABLETS BY MOUTH ON MON, WED & FRI AT BEDTIME     diltiazem (CARDIZEM CD) 120 MG 24 hr capsule Take 1 capsule (120 mg total) by mouth daily. 90 capsule 3   ferric citrate (AURYXIA) 1 GM 210 MG(Fe) tablet Take 2 tablets (420 mg total) by mouth 3 (three) times daily with meals. 270 tablet 0   gabapentin (NEURONTIN) 100 MG capsule Take 100 mg by mouth 2 (two) times daily.     iron sucrose in sodium chloride 0.9 % 100 mL Iron Sucrose (Venofer)     isosorbide mononitrate (IMDUR) 30 MG 24 hr tablet Take 1 tablet (30 mg total) by mouth daily. 30 tablet 6   mupirocin ointment (BACTROBAN) 2 % Apply topically. (Patient not taking: Reported on 08/18/2020)     nitroGLYCERIN (NITROSTAT) 0.4 MG SL tablet Place 1 tablet (0.4 mg total) under the tongue every 5 (five) minutes as needed for chest pain. 75 tablet 3   ondansetron (ZOFRAN) 4 MG tablet Take 1 tablet (4 mg total) by mouth every 8 (eight) hours as needed for  nausea or vomiting. (Patient not taking: Reported on 07/31/2020) 20 tablet 0   rosuvastatin (CRESTOR) 20 MG tablet Take 1 tablet (20 mg total) by mouth daily. 90 tablet 3   thiamine (VITAMIN B-1) 50 MG tablet Take 1 tablet (50 mg total) by mouth daily. (Patient not taking: Reported on 07/31/2020) 90 tablet 1   ticagrelor (BRILINTA) 90 MG TABS tablet Take 1 tablet (90 mg total) by mouth 2 (two) times daily. 32 tablet 0   Labs: Basic Metabolic Panel: Recent Labs  Lab 08/13/2020 1254 08/17/2020 1255 08/07/2020 1427  NA 133* 131* 132*  K 5.2* 5.1 5.1  CL 90* 94*  --   CO2 29  --   --   GLUCOSE 168* 144*  --   BUN 28* 32*  --   CREATININE 7.78* 7.60*  --   CALCIUM 9.4  --   --   PHOS 6.4*  --   --    Liver Function Tests: Recent Labs  Lab 07/21/2020 1254  AST 83*  ALT 78*  ALKPHOS 72  BILITOT 0.7  PROT 7.1  ALBUMIN 3.5   No results for input(s): LIPASE, AMYLASE in the last 168 hours. No results for input(s): AMMONIA in the last 168 hours. CBC: Recent Labs  Lab 07/19/2020 1254 07/28/2020 1255 08/10/2020 1427 07/29/2020 1451  WBC 6.1  --   --  7.9  NEUTROABS 4.2  --   --   --  HGB 10.7* 11.6* 10.5* 9.6*  HCT 34.2* 34.0* 31.0* 30.5*  MCV 98.6  --   --  97.8  PLT 243  --   --  240   Cardiac Enzymes: No results for input(s): CKTOTAL, CKMB, CKMBINDEX, TROPONINI in the last 168 hours. CBG: No results for input(s): GLUCAP in the last 168 hours. Iron Studies: No results for input(s): IRON, TIBC, TRANSFERRIN, FERRITIN in the last 72 hours. Studies/Results: CT Head Wo Contrast  Result Date: 07/26/2020 CLINICAL DATA:  Mental status change. EXAM: CT HEAD WITHOUT CONTRAST TECHNIQUE: Contiguous axial images were obtained from the base of the skull through the vertex without intravenous contrast. COMPARISON:  MRI 04/21/2018 FINDINGS: Exam limited by patient motion. Brain: No evidence of acute infarction, hemorrhage, hydrocephalus, extra-axial collection or mass lesion/mass effect. Vascular:  Scattered vascular calcifications.  No hyperdense vessels. Skull: No skull fracture or bone lesion. Sinuses/Orbits: The paranasal sinuses and mastoid air cells are clear. The globes are intact. Other: No scalp hematoma or lesion. IMPRESSION: No acute intracranial findings or mass lesions. Electronically Signed   By: Marijo Sanes M.D.   On: 08/01/2020 14:38   DG Chest Port 1 View  Result Date: 08/07/2020 CLINICAL DATA:  Patient status post CPR today. EXAM: PORTABLE CHEST 1 VIEW COMPARISON:  PA and lateral chest 04/04/2019. FINDINGS: Endotracheal tube is in place with the tip in good position at the level of the clavicular heads. Extensive airspace disease is present throughout the right chest. There is more patchy airspace disease in the left chest, most notable in the left base. Defibrillator pad is in place. No pneumothorax or pleural effusion is identified. Heart size is normal. No bony abnormality. IMPRESSION: ETT in good position. Right much worse than left airspace disease could be due to pneumonia or aspiration. Electronically Signed   By: Inge Rise M.D.   On: 07/19/2020 13:46    ROS: Review of systems not obtained due to patient factors. Physical Exam: Vitals:   08/13/2020 1400 08/07/2020 1415 08/12/2020 1430 08/17/2020 1545  BP: 107/74 110/65 (!) 110/55 129/71  Pulse: (!) 57 61 69 (!) 53  Resp: 18 20 17 16   Temp: (!) 97.1 F (36.2 C) (!) 97 F (36.1 C) (!) 96.9 F (36.1 C) (!) 96.5 F (35.8 C)  TempSrc:      SpO2: 100% 100% 100% 100%  Height:          Weight change:   Intake/Output Summary (Last 24 hours) at 07/27/2020 1616 Last data filed at 08/11/2020 1331 Gross per 24 hour  Intake 20 ml  Output --  Net 20 ml   BP 129/71   Pulse (!) 53   Temp (!) 96.5 F (35.8 C)   Resp 16   Ht (P) 5' 5"  (1.651 m)   SpO2 100%   BMI (P) 45.60 kg/m  General appearance: intubated and sedated Head: Normocephalic, without obvious abnormality, atraumatic Resp: clear to auscultation  bilaterally Cardio: bradycardic at 53, no rub GI: soft, non-tender; bowel sounds normal; no masses,  no organomegaly Extremities: extremities normal, atraumatic, no cyanosis or edema and LUE AVF +T/B Dialysis Access:  Dialysis Orders: Center: [facility]  on 4 treatments/week . EDW 124kg  HD Bath 2K/2.5Ca  Time 3:30 Heparin 5000 units bolus. Access LUE AVF BFR 400 DFR 600      Assessment/Plan:  PEA arrest - s/p ACLS with ROSC in 10 minutes.  PCCM admitting and will check cardiac enzymes, ECHO, and follow BP's, currently not requiring pressors Acute  hypoxic respiratory failure s/p PEA arrest s/p intubation in the field - possible aspiration.  Per PCCM  ESRD -  will plan on HD tomorrow and UF as tolerated.  Hypertension/volume  - stable at this time  Anemia  - Hgb dropping.  Will start ESA and follow  Metabolic bone disease -  npo for now  Nutrition - npo for now. H/o CAD s/p NSTEMI PCI and DES - cont brillinta and coreg for now.  Off imdur and cardizem given bradycardia DM type II - per primary.  Hyperkalemia - will use lokelma via NGT to temporize until HD tomorrow.   Brenda Potts, MD [place] Pager [phone] 08/05/2020, 4:16 PM

## 2020-08-06 NOTE — Progress Notes (Signed)
Patient transported to CT 

## 2020-08-06 NOTE — Progress Notes (Signed)
12:04 Difficult SpO2 reading. CM with SB but difficult pulse palpation. No pulse to fistula. CPR started. Intubated with 8.5 ETT secured at 22 cm. 100% oxygen  ventilated with ease. EMS called . See CPR documentation.

## 2020-08-06 NOTE — Op Note (Addendum)
[place] Patient Name: Brenda Davis Procedure Date: 08/04/2020 11:28 AM MRN: [id number] Endoscopist: Gerrit Heck , MD Age: 55 Referring MD:  Date of Birth: 07-Sep-1965 Gender: Female Account #: [id number] Procedure:                Upper GI endoscopy Indications:              Anemia, B1 deficiency, Folate deficiency Medicines:                Monitored Anesthesia Care Procedure:                Pre-Anesthesia Assessment:                           - Prior to the procedure, a History and Physical                            was performed, and patient medications and                            allergies were reviewed. The patient's tolerance of                            previous anesthesia was also reviewed. The risks                            and benefits of the procedure and the sedation                            options and risks were discussed with the patient.                            All questions were answered, and informed consent                            was obtained. Prior Anticoagulants: The patient has                            taken no previous anticoagulant or antiplatelet                            agents. ASA Grade Assessment: III - A patient with                            severe systemic disease. After reviewing the risks                            and benefits, the patient was deemed in                            satisfactory condition to undergo the procedure.                           After obtaining informed consent, the endoscope was  passed under direct vision. Throughout the                            procedure, the patient's blood pressure, pulse, and                            oxygen saturations were monitored continuously. The                            GIF [id number] was introduced through the                            mouth, and advanced to the third part of duodenum.                             The upper GI endoscopy was accomplished without                            difficulty. The patient tolerated the procedure                            well. Scope In: Scope Out: Findings:                 The examined esophagus was normal.                           The Z-line was regular and was found 40 cm from the                            incisors.                           The gastroesophageal flap valve was visualized                            endoscopically and classified as Hill Grade III                            (minimal fold, loose to endoscope, hiatal hernia                            likely).                           Localized mild inflammation characterized by                            erythema was found in the gastric antrum. Biopsies                            were taken with a cold forceps for histology.                            Estimated blood loss was minimal.  The examined duodenum was normal. Biopsies were                            taken with a cold forceps for histology. Estimated                            blood loss was minimal. Complications:            No immediate complications. Estimated Blood Loss:     Estimated blood loss was minimal. Impression:               - Normal esophagus.                           - Z-line regular, 40 cm from the incisors.                           - Gastroesophageal flap valve classified as Hill                            Grade III (minimal fold, loose to endoscope, hiatal                            hernia likely).                           - Gastritis. Biopsied.                           - Normal examined duodenum. Biopsied. Recommendation:           - Perform a colonoscopy today. Gerrit Heck, MD 08/05/2020 12:45:35 PM

## 2020-08-06 NOTE — ED Notes (Signed)
Help get patient on the monitor did ekg shown to Dr Johnney Killian patient is resting with nurse at bedside

## 2020-08-06 NOTE — Progress Notes (Signed)
I visited with the patient and family members at bedside today, including her mother.  She is currently intubated with EEG in progress.  Otherwise hemodynamically stable.  Had a good conversation with her family members regarding today's events-PEA arrest during colonoscopy requiring ACLS/intubation in the endoscopy unit, epinephrine x2 and ROSC achieved.  We discussed current clinical status.  I called and spoke with her husband, Kristine Royal, by telephone this evening as well.  All questions answered to the best of my ability and he was appreciative of the phone call.  Sincerely appreciate the excellent care by the ER and [place] along with consulting Neurology, Nephrology, Pharmacy, Cardiology services.  GI service will continue to follow along.  Gerrit Heck, DO [facility]

## 2020-08-06 NOTE — Progress Notes (Signed)
DT VS

## 2020-08-06 NOTE — H&P (Signed)
NAME:  Brenda Davis, MRN:  [id number], DOB:  March 25, 1965, LOS: 0 ADMISSION DATE:  07/20/2020, CONSULTATION DATE:  08/05/2020 REFERRING MD:  Dr. Johnney Killian, CHIEF COMPLAINT:  PEA arrest    History of Present Illness:  Brenda Davis is a 55 y.o. female with a PMH significant for ESRD on dialysis at home 4 days a week , CAD s/p PCI with DES, HTN, HLD, type 2 diabetes, morbid obesity, and anemia who was at an outpatient endoscopy procedure where she suffered a cardiac arrest. Per family she was undergoing a planned colonoscopy and endoscopy for a history of persistent nausea and vomiting. Per report patient had tolerated procedure well until she suddenly suffered a PEA cardiac arrest. Arrest time was estimated at 10 mins, she received 2 round of epi and compressions. Patient was intubated during arrest. Spouse denies any other acute complaints prior to admission.   Pertinent  Medical History  ESRD on dialysis at home 4 days a week , CAD s/p PCI with DES, HTN, HLD, type 2 diabetes, morbid obesity, and anemia  Significant Hospital Events:  7/19 admitted after 10 min PEA arrest post outpatient endocscopy   Interim History / Subjective:  Sedated on vent   Objective   Blood pressure 106/65, pulse (!) 58, temperature (!) 97 F (36.1 C), resp. rate 17, height (P) 5' 5"  (1.651 m), SpO2 100 %.    Vent Mode: PRVC FiO2 (%):  [100 %] 100 % Set Rate:  [16 bmp] 16 bmp Vt Set:  [450 mL] 450 mL PEEP:  [5 cmH20] 5 cmH20 Plateau Pressure:  [21 cmH20] 21 cmH20  No intake or output data in the 24 hours ending 08/02/2020 1417 There were no vitals filed for this visit.  Examination: General: Acute on chronically ill appearing elderly female on mechanical ventilation, in NAD HEENT: ETT, MM pink/moist, pupils pinpoint  Neuro: Sedated on vent  CV: s1s2 regular rate and rhythm, no murmur, rubs, or gallops,  PULM:  Bilateral rhonchi, tolerating vent GI: soft, bowel sounds active in all 4  quadrants, non-tender, non-distended Extremities: warm/dry, non-pitting edema  Skin: no rashes or lesions  Resolved Hospital Problem list     Assessment & Plan:  S/p cardiopulmonary arrest. PEA. Seemingly 2/2 hypoxia mediated event in high risk pt ESRD, piror CAD w/ DES, MO ans sleep apnea. -now hemodynamically stable after establishing airway and ventilation Plan Tele Cycle CEs ECHO   Acute hypoxic respiratory failure s/p cardiac arrest. Suspect complicated by aspiration event/aspiration PNA. Could also reflect asymmetric Pulm edema Plan Full vent support Respiratory culture IV Unasyn F/u cxr VAP bundle PAD protocol Am abg   Acute metabolic encephalopathy s/p cardiac arrest. -estimated time to ROSC 10 minutes -at risk for HIE -having tremor like activity -initial CT head neg Plan Serial neuro checks Avoid fever EEG   ESRD HD dependent (4 times a week HOME HD) Plan Nephrology consult Repeat chems     Fluid, electrolyte and acid base  imbalance: hyperKalemia, hyponatremia and lactic acidosis -lacate likely the effect of cardiac arrest Plan Nephro consult Serial chems Repeat LA   H/o CAD w/ prior MI requiring PCI and DES H/o HTN & HLD Plan Cont Brillinta, crestor, cont coreg Hold imdur  and cardiazem for now Cont tele   H/o diabetes type II Plan Ssi   H/o anemia (reported as FESO4 def) No evidence of bleeding Plan Trend cbc Transfuse per protocol    Best Practice    Diet/type: tubefeeds DVT prophylaxis: prophylactic heparin  GI prophylaxis: PPI Lines: N/A Foley:  N/A Code Status:  full code Last date of multidisciplinary goals of care discussion: Family updated at bedside   Labs   CBC: Recent Labs  Lab 08/10/2020 1254 08/13/2020 1255  WBC 6.1  --   NEUTROABS 4.2  --   HGB 10.7* 11.6*  HCT 34.2* 34.0*  MCV 98.6  --   PLT 243  --     Basic Metabolic Panel: Recent Labs  Lab 08/04/2020 1254 08/13/2020 1255  NA 133* 131*  K 5.2* 5.1   CL 90* 94*  CO2 29  --   GLUCOSE 168* 144*  BUN 28* 32*  CREATININE 7.78* 7.60*  CALCIUM 9.4  --   MG 2.5*  --   PHOS 6.4*  --    GFR: Estimated Creatinine Clearance: 11.1 mL/min (A) (by C-G formula based on SCr of 7.6 mg/dL (H)). Recent Labs  Lab 08/12/2020 1254  WBC 6.1  LATICACIDVEN 3.3*    Liver Function Tests: Recent Labs  Lab 08/07/2020 1254  AST 83*  ALT 78*  ALKPHOS 72  BILITOT 0.7  PROT 7.1  ALBUMIN 3.5   No results for input(s): LIPASE, AMYLASE in the last 168 hours. No results for input(s): AMMONIA in the last 168 hours.  ABG    Component Value Date/Time   PHART 7.307 (L) 01/18/2019 2358   PCO2ART 50.4 (H) 01/18/2019 2358   PO2ART 84.0 01/18/2019 2358   HCO3 25.3 01/18/2019 2358   TCO2 29 08/18/2020 1255   ACIDBASEDEF 1.0 01/18/2019 2358   O2SAT 95.0 01/18/2019 2358     Coagulation Profile: Recent Labs  Lab 08/16/2020 1254  INR 1.2    Cardiac Enzymes: No results for input(s): CKTOTAL, CKMB, CKMBINDEX, TROPONINI in the last 168 hours.  HbA1C: Hemoglobin A1C  Date/Time Value Ref Range Status  07/27/2017 12:00 AM 6.8  Final  05/24/2017 12:00 AM 5.8  Final   Hgb A1c MFr Bld  Date/Time Value Ref Range Status  02/23/2020 09:31 AM 6.3 4.6 - 6.5 % Final    Comment:    Glycemic Control Guidelines for People with Diabetes:Non Diabetic:  <6%Goal of Therapy: <7%Additional Action Suggested:  >8%   04/04/2019 11:27 AM 6.4 4.6 - 6.5 % Final    Comment:    Glycemic Control Guidelines for People with Diabetes:Non Diabetic:  <6%Goal of Therapy: <7%Additional Action Suggested:  >8%     CBG: No results for input(s): GLUCAP in the last 168 hours.  Review of Systems:   Unable to gather secondary to sedation and vent support   Past Medical History:  She,  has a past medical history of Anemia, CAD (coronary artery disease), ESRD on dialysis ([place]), Gout, HCAP (healthcare-associated pneumonia) (05/06/2016), History of blood transfusion, Hyperlipidemia,  Hypertension, Morbid obesity ([place]), Pain, Palpitations (09/24/2016), Peritonitis, dialysis-associated ([facility]), SVT (supraventricular tachycardia) ([place]) (02/28/2020), and Type II diabetes mellitus ([place]).   Surgical History:   Past Surgical History:  Procedure Laterality Date   AV FISTULA PLACEMENT Left 01/03/2015   Procedure: BRACHIAL CEPHALIC ARTERIOVENOUS  FISTULA CREATION LEFT ARM;  Surgeon: Angelia Mould, MD;  Location: [facility];  Service: Vascular;  Laterality: Left;   CARPAL TUNNEL RELEASE Right    [place]  09/14/2017   CORONARY STENT INTERVENTION N/A 01/17/2019   Procedure: CORONARY STENT INTERVENTION;  Surgeon: Jettie Booze, MD;  Location: [facility];  Service: Cardiovascular;  Laterality: N/A;   ESOPHAGOGASTRODUODENOSCOPY N/A 09/04/2012   Procedure: ESOPHAGOGASTRODUODENOSCOPY (EGD);  Surgeon: Shann Medal, MD;  Location: [facility];  Service: General;  Laterality: N/A;  PF   ESOPHAGOGASTRODUODENOSCOPY (EGD) WITH ESOPHAGEAL DILATION N/A 09/29/2012   Procedure: ESOPHAGOGASTRODUODENOSCOPY (EGD) WITH ESOPHAGEAL DILATION;  Surgeon: Milus Banister, MD;  Location: [facility];  Service: Endoscopy;  Laterality: N/A;   FEMORAL ARTERY EXPLORATION Right 01/17/2019   Procedure: Exploration Right Groin with Primary Closure or Arteriotomy Site; Washout of Retroperitoneal Hematoma;  Surgeon: Waynetta Sandy, MD;  Location: [facility];  Service: Vascular;  Laterality: Right;   INSERTION OF DIALYSIS CATHETER N/A 01/03/2015   Procedure: INSERTION OF DIALYSIS CATHETER RIGHT INTERNAL JUGULAR;  Surgeon: Angelia Mould, MD;  Location: [facility];  Service: Vascular;  Laterality: N/A;   LAPAROSCOPIC GASTRIC SLEEVE RESECTION N/A 07/19/2012   Procedure: LAPAROSCOPIC SLEEVE GASTRECTOMY with EGD;  Surgeon: Madilyn Hook, DO;  Location: [facility];  Service: General;  Laterality: N/A;  laparoscopic sleeve gastrectomy with EGD   LEFT HEART CATH AND CORONARY  ANGIOGRAPHY N/A 02/25/2018   Procedure: LEFT HEART CATH AND CORONARY ANGIOGRAPHY;  Surgeon: Leonie Man, MD;  Location: [facility];  Service: Cardiovascular;  Laterality: N/A;   LEFT HEART CATH AND CORONARY ANGIOGRAPHY N/A 06/20/2018   Procedure: LEFT HEART CATH AND CORONARY ANGIOGRAPHY;  Surgeon: Nelva Bush, MD;  Location: [facility];  Service: Cardiovascular;  Laterality: N/A;   LEFT HEART CATH AND CORONARY ANGIOGRAPHY N/A 01/16/2019   Procedure: LEFT HEART CATH AND CORONARY ANGIOGRAPHY;  Surgeon: Jettie Booze, MD;  Location: [facility];  Service: Cardiovascular;  Laterality: N/A;   LEFT HEART CATH AND CORONARY ANGIOGRAPHY N/A 03/03/2019   Procedure: LEFT HEART CATH AND CORONARY ANGIOGRAPHY;  Surgeon: Jettie Booze, MD;  Location: [facility];  Service: Cardiovascular;  Laterality: N/A;   PERIPHERAL VASCULAR BALLOON ANGIOPLASTY Left 08/18/2019   Procedure: PERIPHERAL VASCULAR BALLOON ANGIOPLASTY;  Surgeon: Marty Heck, MD;  Location: [facility];  Service: Cardiovascular;  Laterality: Left;  arm fistula   PERIPHERAL VASCULAR CATHETERIZATION Left 05/23/2015   Procedure: Nolon Stalls;  Surgeon: Conrad Pella, MD;  Location: [facility];  Service: Cardiovascular;  Laterality: Left;  upper aRM   PERIPHERAL VASCULAR CATHETERIZATION Left 05/23/2015   Procedure: Peripheral Vascular Balloon Angioplasty;  Surgeon: Conrad [place], MD;  Location: [facility];  Service: Cardiovascular;  Laterality: Left;  av fistula   TUBAL LIGATION  1993   UPPER GI ENDOSCOPY N/A 07/19/2012   Procedure: UPPER GI ENDOSCOPY;  Surgeon: Madilyn Hook, DO;  Location: [facility];  Service: General;  Laterality: N/A;     Social History:   reports that she quit smoking about 19 years ago. Her smoking use included cigarettes. She has a 16.00 pack-year smoking history. She has never used smokeless tobacco. She reports that she does not drink alcohol and does not use  drugs.   Family History:  Her family history includes Arthritis in an other family member; Diabetes in her father; Hyperlipidemia in an other family member; Hypertension in her mother; Mitral valve prolapse in her mother. There is no history of Cancer, Heart disease, Kidney disease, or Stroke.   Allergies Allergies  Allergen Reactions   Amlodipine Swelling   Atorvastatin     Elevated LFT's   Clonidine Derivatives Swelling    Limbs swell   Doxycycline Nausea And Vomiting    "I threw up for 3 hours"   Welchol [Colesevelam Hcl] Nausea Only     Home Medications  Prior to Admission medications   Medication Sig Start Date End Date Taking? Authorizing Provider  acetaminophen (TYLENOL) 500 MG tablet Take 1,000 mg by mouth every 6 (six) hours as needed for moderate pain or headache.    [provider]  B Complex-C-Biotin-D-FA (DIALYVITE 800 PLUS D) 800 MCG WAFR Take by mouth. 09/04/19   [provider]  calcitRIOL (ROCALTROL) 0.5 MCG capsule Take 1.5 mcg by mouth daily.  04/29/19   [provider]  calcium acetate (PHOSLO) 667 MG capsule Take 667-2,001 mg by mouth See admin instructions. Take 2001 mg with each meal and 667 mg with each snack 06/02/18   [provider]  carvedilol (COREG) 25 MG tablet Take 1 tablet (25 mg total) by mouth 2 (two) times daily. 04/23/20   Skeet Latch, MD  cinacalcet (SENSIPAR) 30 MG tablet TAKE 2 TABLETS BY MOUTH ON MON, WED & FRI AT BEDTIME 04/11/20   [provider]  diltiazem (CARDIZEM CD) 120 MG 24 hr capsule Take 1 capsule (120 mg total) by mouth daily. 02/28/20 05/28/20  Skeet Latch, MD  ferric citrate (AURYXIA) 1 GM 210 MG(Fe) tablet Take 2 tablets (420 mg total) by mouth 3 (three) times daily with meals. 01/23/19   Daune Perch, NP  gabapentin (NEURONTIN) 100 MG capsule Take 100 mg by mouth 2 (two) times daily. 10/31/18   [provider]  iron sucrose in sodium chloride 0.9 % 100 mL Iron Sucrose  (Venofer) 03/01/20   [provider]  isosorbide mononitrate (IMDUR) 30 MG 24 hr tablet Take 1 tablet (30 mg total) by mouth daily. 04/24/20 07/23/20  Deberah Pelton, NP  mupirocin ointment (BACTROBAN) 2 % Apply topically. Patient not taking: Reported on 07/29/2020 09/01/19   [provider]  nitroGLYCERIN (NITROSTAT) 0.4 MG SL tablet Place 1 tablet (0.4 mg total) under the tongue every 5 (five) minutes as needed for chest pain. 07/26/20   Skeet Latch, MD  ondansetron (ZOFRAN) 4 MG tablet Take 1 tablet (4 mg total) by mouth every 8 (eight) hours as needed for nausea or vomiting. Patient not taking: Reported on 07/19/1599 0/93/23   Delora Fuel, MD  rosuvastatin (CRESTOR) 20 MG tablet Take 1 tablet (20 mg total) by mouth daily. 04/23/20   Skeet Latch, MD  thiamine (VITAMIN B-1) 50 MG tablet Take 1 tablet (50 mg total) by mouth daily. Patient not taking: Reported on 07/21/2020 03/01/20   [name], MD  ticagrelor (BRILINTA) 90 MG TABS tablet Take 1 tablet (90 mg total) by mouth 2 (two) times daily. 05/28/20   Skeet Latch, MD     Critical care time:    Performed by: Lysander Calixte D. Harris  Total critical care time: 45 minutes  Critical care time was exclusive of separately billable procedures and treating other patients.  Critical care was necessary to treat or prevent imminent or life-threatening deterioration.  Critical care was time spent personally by me on the following activities: development of treatment plan with patient and/or surrogate as well as nursing, discussions with consultants, evaluation of patient's response to treatment, examination of patient, obtaining history from patient or surrogate, ordering and performing treatments and interventions, ordering and review of laboratory studies, ordering and review of radiographic studies, pulse oximetry and re-evaluation of patient's condition.  Mister Krahenbuhl D. Kenton Kingfisher, NP-C  Pulmonary & Critical  Care Personal contact information can be found on Amion  08/16/2020, 2:59 PM

## 2020-08-06 NOTE — H&P (Deleted)
S/p cardiopulmonary arrest. PEA.  Seemingly 2/2 hypoxia mediated event in high risk pt ESRD, piror CAD w/ DES, MO ans sleep apnea.  -now hemodynamically stable after establishing airway and ventilation  Plan Tele  Cycle CEs ECHO  Acute hypoxic respiratory failure s/p cardiac arrest. Suspect complicated by aspiration event/aspiration PNA. Could also reflect asymmetric Pulm edema  Plan Full vent support Respiratory culture  IV Unasyn F/u cxr VAP bundle  PAD protocol  Am abg  Acute metabolic encephalopathy s/p cardiac arrest.  -estimated time to ROSC 10 minutes -at risk for HIE -having tremor like activity  -initial CT head neg Plan Serial neuro checks Avoid fever  EEG  ESRD HD dependent (4 times a week HOME HD)  Plan Nephrology consult  Repeat chems   Fluid, electrolyte and acid base  imbalance: hyperKalemia, hyponatremia and lactic acidosis  -lacate likely the effect of cardiac arrest Plan Nephro consult  Serial chems Repeat LA   H/o CAD w/ prior MI requiring PCI and DES H/o HTN & HLD Plan Cont Brillinta, crestor, cont coreg Hold imdur  and cardiazem for now  Cont tele   H/o diabetes type II Plan Ssi   H/o anemia (reported as FESO4 def)  No evidence of bleeding Plan Trend cbc Transfuse per protocol

## 2020-08-06 NOTE — Progress Notes (Signed)
EEG complete - results pending 

## 2020-08-06 NOTE — ED Notes (Signed)
Help get patient cleaned up changed the sheets and placed a brief on

## 2020-08-06 NOTE — ED Triage Notes (Signed)
Per [facility] pt was having a colonoscopy and coded. EMS [place] appro[place] 10 mins. Given 1 epi by endo. Patient intubated PTA.

## 2020-08-06 NOTE — ED Provider Notes (Signed)
[facility] EMERGENCY DEPARTMENT Provider Note   CSN: [id number] Arrival date & time: 08/11/2020  1239     History No chief complaint on file.   Brenda Davis is a 55 y.o. female.  HPI She was brought from [employer] post CPR.  Patient had conscious sedation for planned upper and lower endoscopies. Patient is dialysis patient who had been compliant before procedure.  Reportedly they had completed the upper endoscopy portion without issue.  They were doing the lower endoscopy when the patient had a pulseless arrest.  Apparently she lost pulses and CPR initiated.  Patient was intubated at that time.  2 rounds of epinephrine were administered with chest compressions.  EMS reports upon their arrival, patient still had a PEA rhythm and she was placed on [place].  Within another several minutes she had return of palpable pulses and a regular rhythm.  Patient already as stated above had been intubated and was sedated.  She did not have level consciousness assessment.  She remained unresponsive during transport but vital signs normalized.  Patient arrived to the emergency department with pulses present, intubated and unresponsive.   Sandi Carne CRNA:Upper Scope without issue. Lower scope started, pulse ox somewhat inconsistent monitoring but 90%+, had 150 mg bolus of propofol start procedure.  Was given 1 dose of Robinul when heart rates were in the 50s about the time of starting the colonoscopy with positive response.  Just shortly into colonoscopy monitor continued to show organized complexes but patient's pulses were not palpable.  Procedure aborted and compressions and epinephrine administered.  Patient was intubated at that time by CRNA for additional respiratory support. Past Medical History:  Diagnosis Date   Anemia    CAD (coronary artery disease)    a. cath in 02/2018 showing moderate 3-vessel CAD with 45% mid-LADm 55% LCx and 65% RCA stenosis which was not  significant by FFR   ESRD on dialysis ([place])    Gout    HCAP (healthcare-associated pneumonia) 05/06/2016   History of blood transfusion    "related to surgery"   Hyperlipidemia    Hypertension    Morbid obesity ([place])    Pain    LEFT SHOULDER PAIN - WAS SEEN AT AN URGENT CARE - GIVEN SLING FOR COMFORT AND TOLD ROM AS TOLERATED.   Palpitations 09/24/2016   Peritonitis, dialysis-associated (HCC)    SVT (supraventricular tachycardia) ([place]) 02/28/2020   Type II diabetes mellitus ([place])    "gastric sleeve OR corrected this" (05/06/2016)    Patient Active Problem List   Diagnosis Date Noted   Cough 04/03/2020   Snoring 03/30/2020   SVT (supraventricular tachycardia) ([place]) 02/28/2020   Hypertension 02/23/2020   Diabetic neuropathy, painful ([place]) 02/23/2020   Anemia due to acquired thiamine deficiency 04/10/2019   Deficiency anemia 04/04/2019   Unstable angina (HCC) 03/03/2019   Increased anion gap metabolic acidosis 40/08/6759   Hyperkalemia    Coronary artery disease involving native coronary artery of native heart with unstable angina pectoris (HCC)    Hypocalcemia 06/18/2018   Localized swelling of finger of left hand 06/01/2018   Folate deficiency 03/08/2018   NSTEMI (non-ST elevated myocardial infarction) ([place])    CAD in native artery    Hepatic cirrhosis ([place]) 11/24/2017   DM (diabetes mellitus), type 2 with renal complications ([place]) 95/09/3265   Chronic heart failure with preserved ejection fraction ([place]) 06/22/2017   Sensorineural hearing loss (SNHL), bilateral 06/29/2016   Allergic rhinitis 06/27/2016   Severe persistent asthma with  exacerbation 05/12/2016   Anemia of chronic disease 05/06/2016   Routine general medical examination at a health care facility 12/05/2015   Cervical cancer screening 12/05/2015   Visit for screening mammogram 01/23/2015   Gout due to renal impairment 09/13/2014   Thiamine deficiency 12/02/2012   Hypokalemia 11/28/2012   S/P laparoscopic sleeve  gastrectomy 11/28/2012   B12 deficiency anemia 11/28/2012   Morbid obesity with BMI of 40.0-44.9, adult ([place]) 07/22/2012   Dyslipidemia, goal LDL below 70 04/09/2008   CKD (chronic kidney disease) stage V requiring chronic dialysis (MWF) 02/08/2008    Past Surgical History:  Procedure Laterality Date   AV FISTULA PLACEMENT Left 01/03/2015   Procedure: BRACHIAL CEPHALIC ARTERIOVENOUS  FISTULA CREATION LEFT ARM;  Surgeon: Angelia Mould, MD;  Location: [facility];  Service: Vascular;  Laterality: Left;   CARPAL TUNNEL RELEASE Right    [place]  09/14/2017   CORONARY STENT INTERVENTION N/A 01/17/2019   Procedure: CORONARY STENT INTERVENTION;  Surgeon: Jettie Booze, MD;  Location: [facility];  Service: Cardiovascular;  Laterality: N/A;   ESOPHAGOGASTRODUODENOSCOPY N/A 09/04/2012   Procedure: ESOPHAGOGASTRODUODENOSCOPY (EGD);  Surgeon: Shann Medal, MD;  Location: [facility];  Service: General;  Laterality: N/A;  PF   ESOPHAGOGASTRODUODENOSCOPY (EGD) WITH ESOPHAGEAL DILATION N/A 09/29/2012   Procedure: ESOPHAGOGASTRODUODENOSCOPY (EGD) WITH ESOPHAGEAL DILATION;  Surgeon: Milus Banister, MD;  [facility]: [facility];  Service: Endoscopy;  Laterality: N/A;   FEMORAL ARTERY EXPLORATION Right 01/17/2019   Procedure: Exploration Right Groin with Primary Closure or Arteriotomy Site; Washout of Retroperitoneal Hematoma;  Surgeon: Waynetta Sandy, MD;  Location: [facility];  Service: Vascular;  Laterality: Right;   INSERTION OF DIALYSIS CATHETER N/A 01/03/2015   Procedure: INSERTION OF DIALYSIS CATHETER RIGHT INTERNAL JUGULAR;  Surgeon: Angelia Mould, MD;  Location: [facility];  Service: Vascular;  Laterality: N/A;   LAPAROSCOPIC GASTRIC SLEEVE RESECTION N/A 07/19/2012   Procedure: LAPAROSCOPIC SLEEVE GASTRECTOMY with EGD;  Surgeon: Madilyn Hook, DO;  [facility];  Service: General;  Laterality: N/A;  laparoscopic sleeve gastrectomy with  EGD   LEFT HEART CATH AND CORONARY ANGIOGRAPHY N/A 02/25/2018   Procedure: LEFT HEART CATH AND CORONARY ANGIOGRAPHY;  Surgeon: Leonie Man, MD;  Location: [facility];  Service: Cardiovascular;  Laterality: N/A;   LEFT HEART CATH AND CORONARY ANGIOGRAPHY N/A 06/20/2018   Procedure: LEFT HEART CATH AND CORONARY ANGIOGRAPHY;  Surgeon: Nelva Bush, MD;  Location: [facility];  Service: Cardiovascular;  Laterality: N/A;   LEFT HEART CATH AND CORONARY ANGIOGRAPHY N/A 01/16/2019   Procedure: LEFT HEART CATH AND CORONARY ANGIOGRAPHY;  Surgeon: Jettie Booze, MD;  Location: [facility] [facility];  Service: Cardiovascular;  Laterality: N/A;   LEFT HEART CATH AND CORONARY ANGIOGRAPHY N/A 03/03/2019   Procedure: LEFT HEART CATH AND CORONARY ANGIOGRAPHY;  Surgeon: Jettie Booze, MD;  Location: [facility];  Service: Cardiovascular;  Laterality: N/A;   PERIPHERAL VASCULAR BALLOON ANGIOPLASTY Left 08/18/2019   Procedure: PERIPHERAL VASCULAR BALLOON ANGIOPLASTY;  Surgeon: Marty Heck, MD;  Location: [facility];  Service: Cardiovascular;  Laterality: Left;  arm fistula   PERIPHERAL VASCULAR CATHETERIZATION Left 05/23/2015   Procedure: Nolon Stalls;  Surgeon: Conrad Davenport, MD;  Location: [facility];  Service: Cardiovascular;  Laterality: Left;  upper aRM   PERIPHERAL VASCULAR CATHETERIZATION Left 05/23/2015   Procedure: Peripheral Vascular Balloon Angioplasty;  Surgeon: Conrad Trion, MD;  Location:  [facility];  Service: Cardiovascular;  Laterality: Left;  av fistula   TUBAL LIGATION  1993   UPPER GI ENDOSCOPY N/A 07/19/2012   Procedure: UPPER GI ENDOSCOPY;  Surgeon: Madilyn Hook, DO;  [facility];  Service: General;  Laterality: N/A;     OB History   No obstetric history on file.     Family History  Problem Relation Age of Onset   Hypertension Mother    Mitral valve prolapse Mother    Diabetes Father    Arthritis Other     Hyperlipidemia Other    Cancer Neg Hx    Heart disease Neg Hx    Kidney disease Neg Hx    Stroke Neg Hx     Social History   Tobacco Use   Smoking status: Former    Packs/day: 1.00    Years: 16.00    Pack years: 16.00    Types: Cigarettes    Quit date: 01/19/2001    Years since quitting: 19.5   Smokeless tobacco: Never  Vaping Use   Vaping Use: Never used  Substance Use Topics   Alcohol use: Never   Drug use: No    Home Medications Prior to Admission medications   Medication Sig Start Date End Date Taking? Authorizing Provider  acetaminophen (TYLENOL) 500 MG tablet Take 1,000 mg by mouth every 6 (six) hours as needed for moderate pain or headache.    [provider]  B Complex-C-Biotin-D-FA (DIALYVITE 800 PLUS D) 800 MCG WAFR Take by mouth. 09/04/19   [provider]  calcitRIOL (ROCALTROL) 0.5 MCG capsule Take 1.5 mcg by mouth daily.  04/29/19   [provider]  calcium acetate (PHOSLO) 667 MG capsule Take 667-2,001 mg by mouth See admin instructions. Take 2001 mg with each meal and 667 mg with each snack 06/02/18   [provider]  carvedilol (COREG) 25 MG tablet Take 1 tablet (25 mg total) by mouth 2 (two) times daily. 04/23/20   Skeet Latch, MD  cinacalcet (SENSIPAR) 30 MG tablet TAKE 2 TABLETS BY MOUTH ON MON, WED & FRI AT BEDTIME 04/11/20   [provider]  diltiazem (CARDIZEM CD) 120 MG 24 hr capsule Take 1 capsule (120 mg total) by mouth daily. 02/28/20 05/28/20  Skeet Latch, MD  ferric citrate (AURYXIA) 1 GM 210 MG(Fe) tablet Take 2 tablets (420 mg total) by mouth 3 (three) times daily with meals. 01/23/19   Daune Perch, NP  gabapentin (NEURONTIN) 100 MG capsule Take 100 mg by mouth 2 (two) times daily. 10/31/18   [provider]  iron sucrose in sodium chloride 0.9 % 100 mL Iron Sucrose (Venofer) 03/01/20   [provider]  isosorbide mononitrate (IMDUR) 30 MG 24 hr tablet Take 1 tablet (30 mg total) by  mouth daily. 04/24/20 07/23/20  Deberah Pelton, NP  mupirocin ointment (BACTROBAN) 2 % Apply topically. Patient not taking: Reported on 08/03/2020 09/01/19   [provider]  nitroGLYCERIN (NITROSTAT) 0.4 MG SL tablet Place 1 tablet (0.4 mg total) under the tongue every 5 (five) minutes as needed for chest pain. 07/26/20   Skeet Latch, MD  ondansetron (ZOFRAN) 4 MG tablet Take 1 tablet (4 mg total) by mouth every 8 (eight) hours as needed for nausea or vomiting. Patient not taking: Reported on 02/15/5168 0/17/49   Delora Fuel, MD  rosuvastatin (CRESTOR) 20 MG tablet Take 1 tablet (20 mg total) by mouth daily. 04/23/20   Skeet Latch, MD  thiamine (VITAMIN B-1)  50 MG tablet Take 1 tablet (50 mg total) by mouth daily. Patient not taking: Reported on 07/30/2020 03/01/20   [name], MD  ticagrelor (BRILINTA) 90 MG TABS tablet Take 1 tablet (90 mg total) by mouth 2 (two) times daily. 05/28/20   Skeet Latch, MD    Allergies    Amlodipine, Atorvastatin, Clonidine derivatives, Doxycycline, and Welchol [colesevelam hcl]  Review of Systems   Review of Systems Level 5 caveat cannot obtain review of systems due to patient condition. Physical Exam Updated Vital Signs BP (!) 108/52   Pulse 65   Temp (!) 96 F (35.6 C) (Axillary)   Resp 18   Ht (P) 5' 5"  (1.651 m)   SpO2 100%   BMI (P) 45.60 kg/m   Physical Exam Constitutional:      Comments: Patient arrives intubated.  No spontaneous movements.  HENT:     Head: Normocephalic and atraumatic.  Cardiovascular:     Comments: Heart is regular.  No gross rub murmur gallop.  Radial pedal pulses are 2+. Pulmonary:     Comments: Patient is on the ventilator.  Breath sounds are symmetric.  No gross rhonchi or crackle Abdominal:     Comments: Abdomen soft and nondistended.  Musculoskeletal:     Comments: Extremities are warm and symmetric.  Feet and hands are warm to the touch.  Skin:    General: Skin is warm and dry.   Neurological:     Comments: Patient is unresponsive to deep pain.  No Spontaneous movements.    ED Results / Procedures / Treatments   Labs (all labs ordered are listed, but only abnormal results are displayed) Labs Reviewed  I-STAT CHEM 8, ED - Abnormal; Notable for the following components:      Result Value   Sodium 131 (*)    Chloride 94 (*)    BUN 32 (*)    Creatinine, Ser 7.60 (*)    Glucose, Bld 144 (*)    Calcium, Ion 1.07 (*)    Hemoglobin 11.6 (*)    HCT 34.0 (*)    All other components within normal limits  RESP PANEL BY RT-PCR (FLU A&B, COVID) ARPGX2  COMPREHENSIVE METABOLIC PANEL  LACTIC ACID, PLASMA  LACTIC ACID, PLASMA  CBC WITH DIFFERENTIAL/PLATELET  PROTIME-INR  URINALYSIS, ROUTINE W REFLEX MICROSCOPIC  RAPID URINE DRUG SCREEN, HOSP PERFORMED  BLOOD GAS, ARTERIAL  MAGNESIUM  PHOSPHORUS  I-STAT CHEM 8, ED  TROPONIN I (HIGH SENSITIVITY)    EKG EKG Interpretation  Date/Time:  Tuesday August 06 2020 12:43:53 EDT Ventricular Rate:  61 PR Interval:  190 QRS Duration: 90 QT Interval:  507 QTC Calculation: 511 R Axis:   -14 Text Interpretation: Sinus rhythm Consider left ventricular hypertrophy Abnormal T, consider ischemia, lateral leads Prolonged QT interval no change from previous Confirmed by Charlesetta Shanks [phone]) on 07/30/2020 1:14:36 PM  Radiology CT Head Wo Contrast  Result Date: 08/08/2020 CLINICAL DATA:  Mental status change. EXAM: CT HEAD WITHOUT CONTRAST TECHNIQUE: Contiguous axial images were obtained from the base of the skull through the vertex without intravenous contrast. COMPARISON:  MRI 04/21/2018 FINDINGS: Exam limited by patient motion. Brain: No evidence of acute infarction, hemorrhage, hydrocephalus, extra-axial collection or mass lesion/mass effect. Vascular: Scattered vascular calcifications.  No hyperdense vessels. Skull: No skull fracture or bone lesion. Sinuses/Orbits: The paranasal sinuses and mastoid air cells are clear. The  globes are intact. Other: No scalp hematoma or lesion. IMPRESSION: No acute intracranial findings or mass lesions. Electronically Signed  By: Marijo Sanes M.D.   On: 08/05/2020 14:38   DG Chest Port 1 View  Result Date: 08/05/2020 CLINICAL DATA:  Patient status post CPR today. EXAM: PORTABLE CHEST 1 VIEW COMPARISON:  PA and lateral chest 04/04/2019. FINDINGS: Endotracheal tube is in place with the tip in good position at the level of the clavicular heads. Extensive airspace disease is present throughout the right chest. There is more patchy airspace disease in the left chest, most notable in the left base. Defibrillator pad is in place. No pneumothorax or pleural effusion is identified. Heart size is normal. No bony abnormality. IMPRESSION: ETT in good position. Right much worse than left airspace disease could be due to pneumonia or aspiration. Electronically Signed   By: Inge Rise M.D.   On: 07/29/2020 13:46   EEG adult  Result Date: 08/09/2020 Lora Havens, MD     08/05/2020  5:48 PM Patient Name: SUVI ARCHULETTA MRN: [id number] Epilepsy Attending: Lora Havens Referring Physician/Provider: Gerald Leitz, NP Date: 08/07/2020 Duration: 24.42 mins Patient history: 55yo F s/p cardiac arrest. EEG to evaluate for seizure. Level of alertness:  comatose AEDs during EEG study: GBP Technical aspects: This EEG study was done with scalp electrodes positioned according to the 10-20 International system of electrode placement. Electrical activity was acquired at a sampling rate of 500Hz  and reviewed with a high frequency filter of 70Hz  and a low frequency filter of 1Hz . EEG data were recorded continuously and digitally stored. Description: EEG showed continuous generalized background attenuation.  Hyperventilation and photic stimulation were not performed.   Of note, EEG was technically difficult due to significant myogenic artifact. ABNORMALITY - Background attenuation, generalized  IMPRESSION: This technically difficult study is suggestive of profound diffuse encephalopathy, nonspecific etiology. No seizures or epileptiform discharges were seen throughout the recording. If suspicion for  ictal-interictal activity remains a concern, a repeat study with neuromuscular blockade can be considered. Lora Havens    Procedures Procedures {Remember to document critical care time when appropriate CRITICAL CARE Performed by: Charlesetta Shanks   Total critical care time: 30 minutes  Critical care time was exclusive of separately billable procedures and treating other patients.  Critical care was necessary to treat or prevent imminent or life-threatening deterioration.  Critical care was time spent personally by me on the following activities: development of treatment plan with patient and/or surrogate as well as nursing, discussions with consultants, evaluation of patient's response to treatment, examination of patient, obtaining history from patient or surrogate, ordering and performing treatments and interventions, ordering and review of laboratory studies, ordering and review of radiographic studies, pulse oximetry and re-evaluation of patient's condition.  Medications Ordered in ED Medications  docusate (COLACE) 50 MG/5ML liquid 100 mg (has no administration in time range)  polyethylene glycol (MIRALAX / GLYCOLAX) packet 17 g (has no administration in time range)  fentaNYL (SUBLIMAZE) injection 50 mcg (has no administration in time range)  fentaNYL 2576mg in NS 2564m(1019mml) infusion-PREMIX (50 mcg/hr Intravenous New Bag/Given 08/05/2020 1255)  fentaNYL (SUBLIMAZE) bolus via infusion 50-100 mcg (has no administration in time range)  lactated ringers infusion (has no administration in time range)    ED Course  I have reviewed the triage vital signs and the nursing notes.  Pertinent labs & imaging results that were available during my care of the patient were reviewed by  me and considered in my medical decision making (see chart for details).  Clinical Course as of 08/07/20 0748  Tue Aug 06, 2020  1334 Consult: Intensivist consulted will see in the emergency department. [MP]    Clinical Course User Index [MP] Charlesetta Shanks, MD   MDM Rules/Calculators/A&P                          Patient is brought post see up ER from endoscopy suite.  On arrival patient has palpable distal pulses with warm extremities.  Heart rate is a narrow complex sinus rhythm without appearance of significant ischemia or ectopy.  Blood pressures are greater than 286 systolic without additional medication support.  This time we will proceed with diagnostic evaluation.  This will include CT of the head and chest x-rays and i-STAT Chem-8.  Patient was temporarily placed on fentanyl to go to CT scan and for initial evaluation.  She had not had purposeful movements anytime shortly after arrival.  Chemistries are reasonable without significant hyperkalemia or other metabolic derangement immediately suggestive of etiology for this episode.  At this time we will hold fentanyl and auditor any neurologic response.  Intensivists have been consulted for further evaluation and admission. Final Clinical Impression(s) / ED Diagnoses Final diagnoses:  Cardiac arrest ([place])  Severe comorbid illness    Rx / DC Orders ED Discharge Orders     None        Charlesetta Shanks, MD 08/07/20 [phone]

## 2020-08-06 NOTE — Progress Notes (Signed)
11:58 colonoscopy aborted change in patient condition.

## 2020-08-06 NOTE — Progress Notes (Signed)
Patient transported to room 2M12 from the ED without any apparent complications.

## 2020-08-06 NOTE — Progress Notes (Signed)
Pharmacy Antibiotic Note  Brenda Davis is a 55 y.o. female admitted on 07/21/2020 presenting post arrest and concern for aspiration.  Pharmacy has been consulted for Unasyn dosing.  ESRD-HD, 4x wk PTA  Plan: Unasyn 3g IV every 24 hours Monitor HD schedule, clinical progression and LOT  Height: (P) 5' 5"  (165.1 cm) IBW/kg (Calculated) : (P) 57  Temp (24hrs), Avg:96.5 F (35.8 C), Min:95.9 F (35.5 C), Max:97.1 F (36.2 C)  Recent Labs  Lab 08/02/2020 1254 08/10/2020 1255  WBC 6.1  --   CREATININE 7.78* 7.60*  LATICACIDVEN 3.3*  --     Estimated Creatinine Clearance: 11.1 mL/min (A) (by C-G formula based on SCr of 7.6 mg/dL (H)).    Allergies  Allergen Reactions   Amlodipine Swelling   Atorvastatin     Elevated LFT's   Clonidine Derivatives Swelling    Limbs swell   Doxycycline Nausea And Vomiting    "I threw up for 3 hours"   Welchol [Colesevelam Hcl] Nausea Only    Bertis Ruddy, PharmD Clinical Pharmacist ED Pharmacist Phone # [phone] 08/07/2020 2:57 PM

## 2020-08-06 NOTE — Procedures (Addendum)
Patient Name: Brenda [facility]  MRN: [id number]  Epilepsy Attending: Lora Havens  Referring Physician/Provider: Gerald Leitz, NP Date: 08/05/2020  Duration: 24.42 mins  Patient history: 55yo F s/p cardiac arrest. EEG to evaluate for seizure.  Level of alertness:  comatose  AEDs during EEG study: GBP  Technical aspects: This EEG study was done with scalp electrodes positioned according to the 10-20 International system of electrode placement. Electrical activity was acquired at a sampling rate of 500Hz  and reviewed with a high frequency filter of 70Hz  and a low frequency filter of 1Hz . EEG data were recorded continuously and digitally stored.   Description: EEG showed continuous generalized background attenuation.  Hyperventilation and photic stimulation were not performed.     Of note, EEG was technically difficult due to significant myogenic artifact.  ABNORMALITY - Background attenuation, generalized  IMPRESSION: This technically difficult study is suggestive of profound diffuse encephalopathy, nonspecific etiology. No seizures or epileptiform discharges were seen throughout the recording.  If suspicion for  ictal-interictal activity remains a concern, a repeat study with neuromuscular blockade can be considered.    [name] [name]

## 2020-08-06 NOTE — Progress Notes (Signed)
EGD completed. In between EGD and colonsocopy, patient was reassessed. Pulse ox was established and in the 90's. Proceeded with colonoscopy. Colonoscopy was without tortuosity or significant looping, but within 1 minute, patient lost her pulse. Colonoscope was immediately withdrawn. Confirmed no pulse manually, and patient immediately positioned supine with backboard in place. Code Blue called, CPR initated, patient intubated with confirmation of b/l breath sounds. Monitor attached, and no shockable rhythm. Was treated with Epinephrine, CPR resumed. EMS arrived, CPR resumed, and a 2nd dose of Epinephrine was given. ROSC was achieved, and patient was prepared for transport with EMS to [facility].   I discussed these events with the patient's husband. Updated him on the code, interventions made, and patient prepping for transport to [place] [facility] staff will transport him via car to the [facility] ER.

## 2020-08-06 NOTE — Progress Notes (Signed)
Code blue flow sheet completed.

## 2020-08-06 NOTE — ED Notes (Signed)
Critical care at bedside.

## 2020-08-06 NOTE — Op Note (Signed)
[name] Patient Name: Brenda Davis Procedure Date: 07/22/2020 11:24 AM MRN: [id number] Endoscopist: Gerrit Heck , MD Age: 55 Referring MD:  Date of Birth: [birth year] Gender: Female Account #: [id number] Procedure:                Colonoscopy Indications:              Heme positive stool, Iron deficiency anemia Medicines:                Monitored Anesthesia Care Procedure:                Pre-Anesthesia Assessment:                           - Prior to the procedure, a History and Physical                            was performed, and patient medications and                            allergies were reviewed. The patient's tolerance of                            previous anesthesia was also reviewed. The risks                            and benefits of the procedure and the sedation                            options and risks were discussed with the patient.                            All questions were answered, and informed consent                            was obtained. Prior Anticoagulants: The patient has                            taken no previous anticoagulant or antiplatelet                            agents. ASA Grade Assessment: III - A patient with                            severe systemic disease. After reviewing the risks                            and benefits, the patient was deemed in                            satisfactory condition to undergo the procedure.                           After obtaining informed consent, the colonoscope  was passed under direct vision. Throughout the                            procedure, the patient's blood pressure, pulse, and                            oxygen saturations were monitored continuously. The                            CF [id number] #[id number] was introduced through the anus                            with the intention of advancing to the cecum. The                             scope was advanced to the ascending colon before                            the procedure was aborted.                           Colonoscopy was without tortuosity or significant                            looping, but within 1 minute, patient lost her                            pulse. Colonoscope was immediately withdrawn.                            Confirmed no pulse manually, and patient                            immediately positioned supine with backboard in                            place. Code Blue called, CPR initated, patient                            intubated with confirmation of b/l breath sounds.                            Monitor attached, and no shockable rhythm. Was                            treated with Epinephrine, CPR resumed. EMS arrived,                            CPR resumed, and a 2nd dose of Epinephrine was                            given. ROSC was achieved, and patient was prepared  for transport with EMS to [facility]. Scope In: 11:57:20 AM Scope Out: 12:00:26 PM Total Procedure Duration: 0 hours 3 minutes 6 seconds  Findings:                 The perianal and digital rectal examinations were                            normal.                           The ileocecal valve appeared normal. Procedure                            terminated as above. Complications:            Arrhythmia, cardiac arrest Estimated Blood Loss:     Estimated blood loss: none. Impression:               - The ileocecal valve is normal. No further                            evaluation of the colon as above.                           - No specimens collected.                           - I discussed these events with the patient's                            husband. Updated him on the code, interventions                            made, and patient prepping for transport to [facility] with EMS. Our staff will transport                             him via car to the [facility] ER. Recommendation:           - Transporting patient to ER via EMS. Gerrit Heck, MD 08/05/2020 12:50:50 PM

## 2020-08-06 NOTE — Progress Notes (Signed)
Patient is unavailable for EEG.  She will be transferred to room 2N12.  Will reattempt EEG when she is available.

## 2020-08-07 ENCOUNTER — Inpatient Hospital Stay (HOSPITAL_COMMUNITY): Payer: Medicare HMO

## 2020-08-07 DIAGNOSIS — I517 Cardiomegaly: Secondary | ICD-10-CM

## 2020-08-07 DIAGNOSIS — I462 Cardiac arrest due to underlying cardiac condition: Secondary | ICD-10-CM | POA: Diagnosis not present

## 2020-08-07 DIAGNOSIS — I348 Other nonrheumatic mitral valve disorders: Secondary | ICD-10-CM

## 2020-08-07 DIAGNOSIS — I469 Cardiac arrest, cause unspecified: Secondary | ICD-10-CM | POA: Diagnosis not present

## 2020-08-07 DIAGNOSIS — I350 Nonrheumatic aortic (valve) stenosis: Secondary | ICD-10-CM

## 2020-08-07 DIAGNOSIS — I313 Pericardial effusion (noninflammatory): Secondary | ICD-10-CM

## 2020-08-07 LAB — BASIC METABOLIC PANEL
Anion gap: 16 — ABNORMAL HIGH (ref 5–15)
BUN: 38 mg/dL — ABNORMAL HIGH (ref 6–20)
CO2: 25 mmol/L (ref 22–32)
Calcium: 9.6 mg/dL (ref 8.9–10.3)
Chloride: 92 mmol/L — ABNORMAL LOW (ref 98–111)
Creatinine, Ser: 8.29 mg/dL — ABNORMAL HIGH (ref 0.44–1.00)
GFR, Estimated: 5 mL/min — ABNORMAL LOW (ref >60)
Glucose, Bld: 130 mg/dL — ABNORMAL HIGH (ref 70–99)
Potassium: 4.9 mmol/L (ref 3.5–5.1)
Sodium: 133 mmol/L — ABNORMAL LOW (ref 135–145)

## 2020-08-07 LAB — CBC
HCT: 33.1 % — ABNORMAL LOW (ref 36.0–46.0)
Hemoglobin: 11 g/dL — ABNORMAL LOW (ref 12.0–15.0)
MCH: 31.1 pg (ref 26.0–34.0)
MCHC: 33.2 g/dL (ref 30.0–36.0)
MCV: 93.5 fL (ref 80.0–100.0)
Platelets: 261 10*3/uL (ref 150–400)
RBC: 3.54 MIL/uL — ABNORMAL LOW (ref 3.87–5.11)
RDW: 16.2 % — ABNORMAL HIGH (ref 11.5–15.5)
WBC: 12.8 10*3/uL — ABNORMAL HIGH (ref 4.0–10.5)
nRBC: 0 % (ref 0.0–0.2)

## 2020-08-07 LAB — PHOSPHORUS
Phosphorus: 5.1 mg/dL — ABNORMAL HIGH (ref 2.5–4.6)
Phosphorus: 6.4 mg/dL — ABNORMAL HIGH (ref 2.5–4.6)

## 2020-08-07 LAB — MAGNESIUM
Magnesium: 2.3 mg/dL (ref 1.7–2.4)
Magnesium: 2.3 mg/dL (ref 1.7–2.4)

## 2020-08-07 LAB — ECHOCARDIOGRAM COMPLETE
Area-P 1/2: 7.44 cm2
Height: 65 in
S' Lateral: 3.1 cm
Weight: 4352.76 oz

## 2020-08-07 LAB — GLUCOSE, CAPILLARY
Glucose-Capillary: 119 mg/dL — ABNORMAL HIGH (ref 70–99)
Glucose-Capillary: 122 mg/dL — ABNORMAL HIGH (ref 70–99)
Glucose-Capillary: 111 mg/dL — ABNORMAL HIGH (ref 70–99)
Glucose-Capillary: 128 mg/dL — ABNORMAL HIGH (ref 70–99)
Glucose-Capillary: 163 mg/dL — ABNORMAL HIGH (ref 70–99)
Glucose-Capillary: 166 mg/dL — ABNORMAL HIGH (ref 70–99)

## 2020-08-07 MED ORDER — PERFLUTREN LIPID MICROSPHERE
1.0000 mL | INTRAVENOUS | Status: AC | PRN
  Administered 2020-08-07: 2 mL via INTRAVENOUS
  Filled 2020-08-07: qty 10

## 2020-08-07 MED ORDER — FENTANYL CITRATE (PF) 100 MCG/2ML IJ SOLN: 50.0000 ug | Freq: Once | INTRAMUSCULAR | Status: DC

## 2020-08-07 MED ORDER — LACTATED RINGERS IV BOLUS
500.0000 mL | Freq: Once | INTRAVENOUS | Status: AC
  Administered 2020-08-07: 500 mL via INTRAVENOUS

## 2020-08-07 MED ORDER — NOREPINEPHRINE 4 MG/250ML-% IV SOLN: 2.0000 ug/min | INTRAVENOUS | Status: DC

## 2020-08-07 MED ORDER — FENTANYL CITRATE (PF) 100 MCG/2ML IJ SOLN: 25.0000 ug | INTRAMUSCULAR | Status: DC | PRN

## 2020-08-07 MED ORDER — FENTANYL 2500MCG IN NS 250ML (10MCG/ML) PREMIX INFUSION
50.0000 ug/h | INTRAVENOUS | Status: DC
  Administered 2020-08-07: 50 ug/h via INTRAVENOUS
  Filled 2020-08-07: qty 250

## 2020-08-07 MED ORDER — GADOBUTROL 1 MMOL/ML IV SOLN
10.0000 mL | Freq: Once | INTRAVENOUS | Status: AC | PRN
  Administered 2020-08-07: 10 mL via INTRAVENOUS

## 2020-08-07 MED ORDER — PROSOURCE TF PO LIQD
90.0000 mL | Freq: Three times a day (TID) | ORAL | Status: DC
Start: 2020-08-07 — End: 2020-08-13
  Administered 2020-08-07 – 2020-08-13 (×18): 90 mL
  Filled 2020-08-07 (×18): qty 90

## 2020-08-07 MED ORDER — VITAL 1.5 CAL PO LIQD
1000.0000 mL | ORAL | Status: DC
  Administered 2020-08-07 – 2020-08-13 (×6): 1000 mL
  Filled 2020-08-07 (×5): qty 1000

## 2020-08-07 MED ORDER — MIDAZOLAM HCL 2 MG/2ML IJ SOLN
2.0000 mg | INTRAMUSCULAR | Status: DC | PRN
  Administered 2020-08-10 (×3): 2 mg via INTRAVENOUS
  Filled 2020-08-07 (×2): qty 2

## 2020-08-07 MED ORDER — POLYETHYLENE GLYCOL 3350 17 G PO PACK: 17.0000 g | PACK | Freq: Every day | ORAL | Status: DC | PRN

## 2020-08-07 MED ORDER — SODIUM CHLORIDE 0.9 % IV SOLN
INTRAVENOUS | Status: DC | PRN
  Administered 2020-08-07 (×2): 250 mL via INTRAVENOUS

## 2020-08-07 MED ORDER — NOREPINEPHRINE 4 MG/250ML-% IV SOLN
INTRAVENOUS | Status: AC
  Administered 2020-08-07: 2 ug/min via INTRAVENOUS
  Filled 2020-08-07: qty 250

## 2020-08-07 MED ORDER — FAMOTIDINE IN NACL 20-0.9 MG/50ML-% IV SOLN
20.0000 mg | Freq: Two times a day (BID) | INTRAVENOUS | Status: DC
  Filled 2020-08-07: qty 50

## 2020-08-07 MED ORDER — MIDAZOLAM HCL 2 MG/2ML IJ SOLN
2.0000 mg | INTRAMUSCULAR | Status: DC | PRN
  Filled 2020-08-07 (×2): qty 2

## 2020-08-07 MED ORDER — SODIUM CHLORIDE 0.9 % IV SOLN
250.0000 mL | INTRAVENOUS | Status: DC
  Administered 2020-08-07: 250 mL via INTRAVENOUS

## 2020-08-07 MED ORDER — FAMOTIDINE 20 MG PO TABS
10.0000 mg | ORAL_TABLET | Freq: Every day | ORAL | Status: DC
  Administered 2020-08-08 – 2020-08-15 (×8): 10 mg
  Filled 2020-08-07 (×9): qty 1

## 2020-08-07 MED ORDER — RENA-VITE PO TABS
1.0000 | ORAL_TABLET | Freq: Every day | ORAL | Status: DC
  Administered 2020-08-07 – 2020-08-15 (×9): 1
  Filled 2020-08-07 (×9): qty 1

## 2020-08-07 MED ORDER — FAMOTIDINE IN NACL 20-0.9 MG/50ML-% IV SOLN
20.0000 mg | Freq: Every day | INTRAVENOUS | Status: DC
  Administered 2020-08-07: 20 mg via INTRAVENOUS
  Filled 2020-08-07: qty 50

## 2020-08-07 MED ORDER — DOCUSATE SODIUM 50 MG/5ML PO LIQD: 100.0000 mg | Freq: Two times a day (BID) | ORAL | Status: DC | PRN

## 2020-08-07 MED ORDER — VECURONIUM BROMIDE 10 MG IV SOLR
10.0000 mg | Freq: Once | INTRAVENOUS | Status: DC | PRN
  Filled 2020-08-07 (×2): qty 10

## 2020-08-07 MED ORDER — FENTANYL BOLUS VIA INFUSION
50.0000 ug | INTRAVENOUS | Status: DC | PRN
  Administered 2020-08-07: 100 ug via INTRAVENOUS
  Administered 2020-08-08: 50 ug via INTRAVENOUS
  Filled 2020-08-07: qty 100

## 2020-08-07 NOTE — Progress Notes (Signed)
Initial Nutrition Assessment  DOCUMENTATION CODES:   Morbid obesity  INTERVENTION:   Initiate tube feeding via OG tube: Vital 1.5 at 40 ml/h (960 ml per day) Prosource TF 90 ml TID  Provides 1680 kcal, 131 gm protein, 733 ml free water daily  NUTRITION DIAGNOSIS:   Inadequate oral intake related to inability to eat as evidenced by NPO status.  GOAL:   Provide needs based on ASPEN/SCCM guidelines  MONITOR:   Vent status, Labs, TF tolerance  REASON FOR ASSESSMENT:   Ventilator, Consult Enteral/tube feeding initiation and management  ASSESSMENT:   56 yo female admitted S/P PEA cardiac arrest during outpatient endoscopy procedure. PMH includes ESRD on HD at home 4 days per week, CAD, HTN, HLD, DM-2.  Discussed patient in ICU rounds and with RN today. Patient having non purposeful movements this morning. Plans for MRI, EEG, and HD later today.   Received MD Consult for TF initiation and management. OG tube in place. Gastritis found on EGD. Will utilize a semi-elemental formula to prevent further GI issues.  Patient is currently intubated on ventilator support MV: 6.6 L/min Temp (24hrs), Avg:97.1 F (36.2 C), Min:96 F (35.6 C), Max:98.5 F (36.9 C)   Labs reviewed. Na 133, BUN 38, creatinine 8.29, phos 5.1 CBG: 119-122  Medications reviewed and include colace, novolog, rena-vit.  Weight history reviewed; no significant weight changes recently.  EDW 124 kg Currently 123.4 kg  NUTRITION - FOCUSED PHYSICAL EXAM:  Flowsheet Row Most Recent Value  Orbital Region No depletion  Upper Arm Region No depletion  Thoracic and Lumbar Region No depletion  Buccal Region No depletion  Temple Region No depletion  Clavicle Bone Region No depletion  Clavicle and Acromion Bone Region No depletion  Scapular Bone Region No depletion  Dorsal Hand No depletion  Patellar Region No depletion  Anterior Thigh Region No depletion  Posterior Calf Region No depletion  Edema (RD  Assessment) Mild  Hair Reviewed  Eyes Unable to assess  Mouth Unable to assess  Skin Reviewed  Nails Reviewed       Diet Order:   Diet Order             Diet NPO time specified  Diet effective now                   EDUCATION NEEDS:   Not appropriate for education at this time  Skin:  Skin Assessment: Reviewed RN Assessment  Last BM:  7/19  Height:   Ht Readings from Last 1 Encounters:  08/05/2020 5' 5"  (1.651 m)    Weight:   Wt Readings from Last 1 Encounters:  08/07/20 123.4 kg    Ideal Body Weight:  56.8 kg  BMI:  Body mass index is 45.27 kg/m.  Estimated Nutritional Needs:   Kcal:  1360-1730  Protein:  120-140 gm  Fluid:  >/= 1.8 L    Lucas Mallow, RD, LDN, CNSC Please refer to Amion for contact information.

## 2020-08-07 NOTE — Progress Notes (Signed)
[facility]   Subjective: No acute events overnight, but concern for twitching and involuntary movement with sedation wean today. Fentanyl restarted. Plan for EEG, MRI along with HD today.   Objective: Vital signs in last 24 hours: Temp:  [96.5 F (35.8 C)-98.5 F (36.9 C)] 97.6 F (36.4 C) (07/20 0332) Pulse Rate:  [52-103] 68 (07/20 1400) Resp:  [0-25] 0 (07/20 1400) BP: (96-185)/(52-108) 96/52 (07/20 1400) SpO2:  [100 %] 100 % (07/20 1400) FiO2 (%):  [40 %] 40 % (07/20 0713) Weight:  [123.4 kg] 123.4 kg (07/20 0500) Last BM Date: 08/07/20 General: Intubated, sedated HEENT: ETT in place   Intake/Output from previous day: 07/19 0701 - 07/20 0700 In: 956.2 [I.V.:886.2; IV Piggyback:50] Out: 35 [Urine:35] Intake/Output this shift: Total I/O In: 26.7 [NG/GT:26.7] Out: -    Lab Results: Recent Labs    08/07/2020 1254 08/03/2020 1255 07/30/2020 1427 07/22/2020 1451 08/07/20 0447  WBC 6.1  --   --  7.9 12.8*  HGB 10.7*   < > 10.5* 9.6* 11.0*  PLT 243  --   --  240 261  MCV 98.6  --   --  97.8 93.5   < > = values in this interval not displayed.   BMET Recent Labs    08/14/2020 1254 08/02/2020 1255 07/23/2020 1427 07/30/2020 1451 08/07/20 0447  NA 133* 131* 132*  --  133*  K 5.2* 5.1 5.1  --  4.9  CL 90* 94*  --   --  92*  CO2 29  --   --   --  25  GLUCOSE 168* 144*  --   --  130*  BUN 28* 32*  --   --  38*  CREATININE 7.78* 7.60*  --  8.09* 8.29*  CALCIUM 9.4  --   --   --  9.6   LFT Recent Labs    08/09/2020 1254  PROT 7.1  ALBUMIN 3.5  AST 83*  ALT 78*  ALKPHOS 72  BILITOT 0.7   PT/INR Recent Labs    08/05/2020 1254  INR 1.2      Imaging/Other results: CT Head Wo Contrast  Result Date: 08/04/2020 CLINICAL DATA:  Mental status change. EXAM: CT HEAD WITHOUT CONTRAST TECHNIQUE: Contiguous axial images were obtained from the base of the skull through the vertex without intravenous contrast. COMPARISON:  MRI 04/21/2018 FINDINGS: Exam limited by  patient motion. Brain: No evidence of acute infarction, hemorrhage, hydrocephalus, extra-axial collection or mass lesion/mass effect. Vascular: Scattered vascular calcifications.  No hyperdense vessels. Skull: No skull fracture or bone lesion. Sinuses/Orbits: The paranasal sinuses and mastoid air cells are clear. The globes are intact. Other: No scalp hematoma or lesion. IMPRESSION: No acute intracranial findings or mass lesions. Electronically Signed   By: Marijo Sanes M.D.   On: 07/29/2020 14:38   DG Chest Port 1 View  Result Date: 07/22/2020 CLINICAL DATA:  Patient status post CPR today. EXAM: PORTABLE CHEST 1 VIEW COMPARISON:  PA and lateral chest 04/04/2019. FINDINGS: Endotracheal tube is in place with the tip in good position at the level of the clavicular heads. Extensive airspace disease is present throughout the right chest. There is more patchy airspace disease in the left chest, most notable in the left base. Defibrillator pad is in place. No pneumothorax or pleural effusion is identified. Heart size is normal. No bony abnormality. IMPRESSION: ETT in good position. Right much worse than left airspace disease could be due to pneumonia or aspiration. Electronically Signed   By:  Inge Rise M.D.   On: 08/02/2020 13:46   EEG adult  Result Date: 08/07/2020 Brenda Havens, MD     08/07/2020  2:34 PM Patient Name: Brenda Davis MRN: [id number] Epilepsy Attending: Lora Davis Referring Physician/Provider: Gerald Leitz, NP Date: 08/07/2020 Duration: 23.46 mins Patient history: 55yo F s/p cardiac arrest. EEG to evaluate for seizure.  Level of alertness:  comatose  AEDs during EEG study: GBP  Technical aspects: This EEG study was done with scalp electrodes positioned according to the 10-20 International system of electrode placement. Electrical activity was acquired at a sampling rate of 500Hz  and reviewed with a high frequency filter of 70Hz  and a low frequency filter of 1Hz . EEG  data were recorded continuously and digitally stored.  Description: EEG showed continuous generalized amplitude sharply contoured 3 to 5 Hz theta-delta slowing, at times with triphasic morphology.  Hyperventilation and photic stimulation were not performed.     ABNORMALITY -Continuous slow, generalized  IMPRESSION: This study is suggestive of severe diffuse encephalopathy, nonspecific etiology. No seizures or epileptiform discharges were seen throughout the recording. EEG appears to be improving compared to previous day.   Brenda Davis   EEG adult  Result Date: 08/07/2020 Brenda Havens, MD     08/07/2020  5:48 PM Patient Name: Brenda Davis MRN: [id number] Epilepsy Attending: Lora Davis Referring Physician/Provider: Gerald Leitz, NP Date: 07/21/2020 Duration: 24.42 mins Patient history: 55yo F s/p cardiac arrest. EEG to evaluate for seizure. Level of alertness:  comatose AEDs during EEG study: GBP Technical aspects: This EEG study was done with scalp electrodes positioned according to the 10-20 International system of electrode placement. Electrical activity was acquired at a sampling rate of 500Hz  and reviewed with a high frequency filter of 70Hz  and a low frequency filter of 1Hz . EEG data were recorded continuously and digitally stored. Description: EEG showed continuous generalized background attenuation.  Hyperventilation and photic stimulation were not performed.   Of note, EEG was technically difficult due to significant myogenic artifact. ABNORMALITY - Background attenuation, generalized IMPRESSION: This technically difficult study is suggestive of profound diffuse encephalopathy, nonspecific etiology. No seizures or epileptiform discharges were seen throughout the recording. If suspicion for  ictal-interictal activity remains a concern, a repeat study with neuromuscular blockade can be considered. Brenda Davis      Assessment and Plan: 1) PEA Arrest 2) ESRD 3) HTN 4)  CAD - Planning on EEG and MRI today - HD per Nephrology service - I called to discuss with the patient's husband by phone today. Answered all questions to the best of my ability.  - Sincerely appreciate the excellent care of Brenda Davis by the primary Critical Care Service and coordinated care by the multiple consulting services. GI service will continue to follow along, mostly for patient and family support efforts    Lavena Bullion, DO  08/07/2020, 2:50 PM [facility] Pager [phone]

## 2020-08-07 NOTE — Progress Notes (Signed)
[facility] Progress Note Patient Name: Brenda Davis DOB: Apr 25, 1965 MRN: [id number]   Date of Service  08/07/2020  HPI/Events of Note  Patient has LR infusing at 150 ml / hour, patient has ESRD.  eICU Interventions  Lactated Ringers discontinued.        [name] 08/07/2020, 12:11 AM

## 2020-08-07 NOTE — Progress Notes (Signed)
Patient ID: Brenda Davis, female   DOB: Feb 20, 1965, 55 y.o.   MRN: [id number] S:No events overnight.  Currently off of sedation and is without meaningful movement at this time. O:BP (!) 158/94   Pulse 86   Temp 97.6 F (36.4 C) (Axillary)   Resp 17   Ht 5' 5"  (1.651 m)   Wt 123.4 kg   SpO2 100%   BMI 45.27 kg/m   Intake/Output Summary (Last 24 hours) at 08/07/2020 1024 Last data filed at 08/07/2020 0500 Gross per 24 hour  Intake 956.22 ml  Output 35 ml  Net 921.22 ml   Intake/Output: I/O last 3 completed shifts: In: 956.2 [I.V.:886.2; Other:20; IV Piggyback:50] Out: 35 [Urine:35]  Intake/Output this shift:  No intake/output data recorded. Weight change:  BEE:FEOFHQRFX and unresponsive CVS:RRR Resp:ventilated BS bilaterally Abd:+BS, soft, Nt/Nd Ext:no edema, LUE AVF +T/B  Recent Labs  Lab 08/14/2020 1254 07/21/2020 1255 08/02/2020 1427 08/08/2020 1451 08/17/2020 1706 08/07/20 0447  NA 133* 131* 132*  --   --  133*  K 5.2* 5.1 5.1  --   --  4.9  CL 90* 94*  --   --   --  92*  CO2 29  --   --   --   --  25  GLUCOSE 168* 144*  --   --   --  130*  BUN 28* 32*  --   --   --  38*  CREATININE 7.78* 7.60*  --  8.09*  --  8.29*  ALBUMIN 3.5  --   --   --   --   --   CALCIUM 9.4  --   --   --   --  9.6  PHOS 6.4*  --   --  5.7* 5.6* 5.1*  AST 83*  --   --   --   --   --   ALT 78*  --   --   --   --   --    Liver Function Tests: Recent Labs  Lab 08/03/2020 1254  AST 83*  ALT 78*  ALKPHOS 72  BILITOT 0.7  PROT 7.1  ALBUMIN 3.5   No results for input(s): LIPASE, AMYLASE in the last 168 hours. No results for input(s): AMMONIA in the last 168 hours. CBC: Recent Labs  Lab 08/13/2020 1254 07/22/2020 1255 08/14/2020 1427 08/18/2020 1451 08/07/20 0447  WBC 6.1  --   --  7.9 12.8*  NEUTROABS 4.2  --   --   --   --   HGB 10.7*   < > 10.5* 9.6* 11.0*  HCT 34.2*   < > 31.0* 30.5* 33.1*  MCV 98.6  --   --  97.8 93.5  PLT 243  --   --  240 261   < > = values in this  interval not displayed.   Cardiac Enzymes: No results for input(s): CKTOTAL, CKMB, CKMBINDEX, TROPONINI in the last 168 hours. CBG: Recent Labs  Lab 07/31/2020 1632 08/03/2020 1918 07/22/2020 2330 08/07/20 0318 08/07/20 0737  GLUCAP 109* 140* 142* 119* 122*    Iron Studies: No results for input(s): IRON, TIBC, TRANSFERRIN, FERRITIN in the last 72 hours. Studies/Results: CT Head Wo Contrast  Result Date: 07/28/2020 CLINICAL DATA:  Mental status change. EXAM: CT HEAD WITHOUT CONTRAST TECHNIQUE: Contiguous axial images were obtained from the base of the skull through the vertex without intravenous contrast. COMPARISON:  MRI 04/21/2018 FINDINGS: Exam limited by patient motion. Brain: No evidence of acute infarction, hemorrhage,  hydrocephalus, extra-axial collection or mass lesion/mass effect. Vascular: Scattered vascular calcifications.  No hyperdense vessels. Skull: No skull fracture or bone lesion. Sinuses/Orbits: The paranasal sinuses and mastoid air cells are clear. The globes are intact. Other: No scalp hematoma or lesion. IMPRESSION: No acute intracranial findings or mass lesions. Electronically Signed   By: Marijo Sanes M.D.   On: 08/07/2020 14:38   DG Chest Port 1 View  Result Date: 08/10/2020 CLINICAL DATA:  Patient status post CPR today. EXAM: PORTABLE CHEST 1 VIEW COMPARISON:  PA and lateral chest 04/04/2019. FINDINGS: Endotracheal tube is in place with the tip in good position at the level of the clavicular heads. Extensive airspace disease is present throughout the right chest. There is more patchy airspace disease in the left chest, most notable in the left base. Defibrillator pad is in place. No pneumothorax or pleural effusion is identified. Heart size is normal. No bony abnormality. IMPRESSION: ETT in good position. Right much worse than left airspace disease could be due to pneumonia or aspiration. Electronically Signed   By: Inge Rise M.D.   On: 08/12/2020 13:46   EEG  adult  Result Date: 08/08/2020 Lora Havens, MD     08/13/2020  5:48 PM Patient Name: Brenda Davis MRN: [id number] Epilepsy Attending: Lora Havens Referring Physician/Provider: Gerald Leitz, NP Date: 08/09/2020 Duration: 24.42 mins Patient history: 55yo F s/p cardiac arrest. EEG to evaluate for seizure. Level of alertness:  comatose AEDs during EEG study: GBP Technical aspects: This EEG study was done with scalp electrodes positioned according to the 10-20 International system of electrode placement. Electrical activity was acquired at a sampling rate of 500Hz  and reviewed with a high frequency filter of 70Hz  and a low frequency filter of 1Hz . EEG data were recorded continuously and digitally stored. Description: EEG showed continuous generalized background attenuation.  Hyperventilation and photic stimulation were not performed.   Of note, EEG was technically difficult due to significant myogenic artifact. ABNORMALITY - Background attenuation, generalized IMPRESSION: This technically difficult study is suggestive of profound diffuse encephalopathy, nonspecific etiology. No seizures or epileptiform discharges were seen throughout the recording. If suspicion for  ictal-interictal activity remains a concern, a repeat study with neuromuscular blockade can be considered. Priyanka [name]    carvedilol  25 mg Per Tube BID   chlorhexidine gluconate (MEDLINE KIT)  15 mL Mouth Rinse BID   Chlorhexidine Gluconate Cloth  6 each Topical Daily   Chlorhexidine Gluconate Cloth  6 each Topical Q0600   docusate  100 mg Per Tube BID   [START ON 08/08/2020] famotidine  10 mg Per Tube Daily   feeding supplement (PROSource TF)  45 mL Per Tube BID   feeding supplement (VITAL HIGH PROTEIN)  1,000 mL Per Tube Q24H   fentaNYL (SUBLIMAZE) injection  50 mcg Intravenous Once   heparin  5,000 Units Subcutaneous Q8H   insulin aspart  0-15 Units Subcutaneous Q4H   mouth rinse  15 mL Mouth Rinse 10 times per day    multivitamin  1 tablet Per Tube Daily   rosuvastatin  20 mg Per Tube Daily   ticagrelor  90 mg Per Tube BID    BMET    Component Value Date/Time   NA 133 (L) 08/07/2020 0447   NA 139 02/28/2020 1047   K 4.9 08/07/2020 0447   CL 92 (L) 08/07/2020 0447   CO2 25 08/07/2020 0447   GLUCOSE 130 (H) 08/07/2020 0447   BUN 38 (H) 08/07/2020 0447  BUN 29 (H) 02/28/2020 1047   CREATININE 8.29 (H) 08/07/2020 0447   CREATININE 2.28 (H) 03/21/2012 0952   CALCIUM 9.6 08/07/2020 0447   GFRNONAA 5 (L) 08/07/2020 0447   GFRAA 6 (L) 02/28/2020 1047   CBC    Component Value Date/Time   WBC 12.8 (H) 08/07/2020 0447   RBC 3.54 (L) 08/07/2020 0447   HGB 11.0 (L) 08/07/2020 0447   HGB 8.1 (L) 02/28/2020 1047   HCT 33.1 (L) 08/07/2020 0447   HCT 25.0 (L) 02/28/2020 1047   PLT 261 08/07/2020 0447   PLT 218 02/28/2020 1047   MCV 93.5 08/07/2020 0447   MCV 98 (H) 02/28/2020 1047   MCH 31.1 08/07/2020 0447   MCHC 33.2 08/07/2020 0447   RDW 16.2 (H) 08/07/2020 0447   RDW 15.0 02/28/2020 1047   LYMPHSABS 1.4 07/26/2020 1254   LYMPHSABS 1.1 02/28/2020 1047   MONOABS 0.3 07/22/2020 1254   EOSABS 0.1 07/23/2020 1254   EOSABS 0.0 02/28/2020 1047   BASOSABS 0.0 08/17/2020 1254   BASOSABS 0.0 02/28/2020 1047    Dialysis Orders: Center: [facility]  on 4 treatments/week . EDW 124kg  HD Bath 2K/2.5Ca  Time 3:30 Heparin 5000 units bolus. Access LUE AVF BFR 400 DFR 600        Assessment/Plan:  PEA arrest - s/p ACLS with ROSC in 10 minutes.  PCCM admitting and will check cardiac enzymes, ECHO, and follow BP's, currently not requiring pressors Acute hypoxic respiratory failure s/p PEA arrest s/p intubation in the field - possible aspiration.  Per PCCM  ESRD -  will plan on HD today and follow her mentation afterwards.  Hypertension/volume  - stable at this time  Anemia  - Hgb dropping.  Will start ESA and follow  Metabolic bone disease -  npo for now  Nutrition - npo for now. H/o CAD s/p  NSTEMI PCI and DES - cont brillinta and coreg for now.  Off imdur and cardizem given bradycardia DM type II - per primary. Acute metabolic encephalopathy - EEG without seizures but suggestive of profound, diffuse encephalopathy.  Currently off sedation   Donetta Potts, MD [employer] [phone]

## 2020-08-07 NOTE — Progress Notes (Signed)
  Echocardiogram 2D Echocardiogram has been performed.  Brenda Davis 08/07/2020, 8:55 AM

## 2020-08-07 NOTE — Progress Notes (Signed)
Ventilator patient transported from 2M12 to MRI and back without any complications.

## 2020-08-07 NOTE — Procedures (Signed)
Patient Name: Brenda Davis  MRN: [id number]  Epilepsy Attending: Lora Havens  Referring Physician/Provider: Gerald Leitz, NP Date: 08/07/2020 Duration: 23.46 mins  Patient history: 55yo F s/p cardiac arrest. EEG to evaluate for seizure.   Level of alertness:  comatose   AEDs during EEG study: GBP   Technical aspects: This EEG study was done with scalp electrodes positioned according to the 10-20 International system of electrode placement. Electrical activity was acquired at a sampling rate of 500Hz  and reviewed with a high frequency filter of 70Hz  and a low frequency filter of 1Hz . EEG data were recorded continuously and digitally stored.   Description: EEG showed continuous generalized amplitude sharply contoured 3 to 5 Hz theta-delta slowing, at times with triphasic morphology.  Hyperventilation and photic stimulation were not performed.       ABNORMALITY -Continuous slow, generalized   IMPRESSION: This study is suggestive of severe diffuse encephalopathy, nonspecific etiology. No seizures or epileptiform discharges were seen throughout the recording.  EEG appears to be improving compared to previous day.     Rylyn Ranganathan [name]

## 2020-08-07 NOTE — Progress Notes (Signed)
EEG Completed; Results Pending  

## 2020-08-07 NOTE — Progress Notes (Signed)
[facility] Progress Note Patient Name: Brenda Davis DOB: April 09, 1965 MRN: [id number]   Date of Service  08/07/2020  HPI/Events of Note  Patient is about to have intermittent hemodialysis and has a Coreg dose scheduled for tonight, bedside RN asking for clarification of whether to hold Coreg due to potential for hypotension on dialysis.  eICU Interventions  Coreg held with plan to resume after dialysis if patient is hemodynamically stable.        Kerry Kass Keysha Damewood 08/07/2020, 10:41 PM

## 2020-08-07 NOTE — Progress Notes (Signed)
[facility] Progress Note Patient Name: Brenda Davis DOB: [birth year] MRN: [id number]   Date of Service  08/07/2020  HPI/Events of Note  Patient needs order for stress ulcer prophylaxis.  eICU Interventions  Pepcid 20 mg  po Q 24 hours ordered.        Kerry Kass Maddex Garlitz 08/07/2020, 3:16 AM

## 2020-08-07 NOTE — Progress Notes (Signed)
PCCM note  When off sedation patient started having involuntary movements Restarted fentanyl gtt Will get EEG and MRI to evaluate for seizures, anoxic injury  Family updated at bedside  Marshell Garfinkel MD [facility] 08/07/2020, 2:41 PM

## 2020-08-07 NOTE — Progress Notes (Signed)
Assisted tele visit to patient with son.  Mcarthur Rossetti, RN

## 2020-08-07 NOTE — TOC Initial Note (Signed)
Transition of Care [facility]) - Initial/Assessment Note    Patient Details  Name: Brenda Davis MRN: [id number] Date of Birth: 07/11/1965  Transition of [facility]) CM/SW Contact:    Sharin Mons, RN Phone Number: 08/07/2020, 12:15 PM  Clinical Narrative:                 Admitted after cardiac arrest during outpatient EGD colonoscopy.Hx of  end-stage renal disease, coronary artery disease, hypertension, hyperlipidemia, diabetes. From home alone. Pt intubated/vent. Spoke with pt's husband Elberta Fortis regarding d/c planning.  Kristine Royal (Spouse)      [phone]     Supportive husband. States they are separated, however, helps assist pt with home HD treatments, 4x week. Elberta Fortis states PTA  pt was independent with ADL's, no DME usage.  PCP: Scarlette Calico  [facility] team monitoring and following for TOC needs...  Expected Discharge Plan: Home/Self Care Barriers to Discharge: Continued Medical Work up   Patient Goals and CMS Choice        Expected Discharge Plan and Services Expected Discharge Plan: Home/Self Care   Discharge Planning Services: CM Consult   Living arrangements for the past 2 months: Single Family Home                                      Prior Living Arrangements/Services Living arrangements for the past 2 months: Single Family Home Lives with:: Self                   Activities of Daily Living      Permission Sought/Granted                  Emotional Assessment              Admission diagnosis:  Cardiac arrest [facility]) [I46.9] Patient Active Problem List   Diagnosis Date Noted   Cardiac arrest ([place]) 08/16/2020   Cough 04/03/2020   Snoring 03/30/2020   SVT (supraventricular tachycardia) ([place]) 02/28/2020   Hypertension 02/23/2020   Diabetic neuropathy, painful ([place]) 02/23/2020   Anemia due to acquired thiamine deficiency 04/10/2019   Deficiency anemia 04/04/2019   Unstable angina ([place]) 03/03/2019   Increased  anion gap metabolic acidosis 15/17/6160   Hyperkalemia    Coronary artery disease involving native coronary artery of native heart with unstable angina pectoris (HCC)    Hypocalcemia 06/18/2018   Localized swelling of finger of left hand 06/01/2018   Folate deficiency 03/08/2018   NSTEMI (non-ST elevated myocardial infarction) ([place])    CAD in native artery    Hepatic cirrhosis ([place]) 11/24/2017   DM (diabetes mellitus), type 2 with renal complications ([place]) 73/71/0626   Chronic heart failure with preserved ejection fraction ([place]) 06/22/2017   Sensorineural hearing loss (SNHL), bilateral 06/29/2016   Allergic rhinitis 06/27/2016   Severe persistent asthma with exacerbation 05/12/2016   Anemia of chronic disease 05/06/2016   Routine general medical examination at a health care facility 12/05/2015   Cervical cancer screening 12/05/2015   Visit for screening mammogram 01/23/2015   Gout due to renal impairment 09/13/2014   Thiamine deficiency 12/02/2012   Hypokalemia 11/28/2012   S/P laparoscopic sleeve gastrectomy 11/28/2012   B12 deficiency anemia 11/28/2012   Morbid obesity with BMI of 40.0-44.9, adult ([place]) 07/22/2012   Dyslipidemia, goal LDL below 70 04/09/2008   CKD (chronic kidney disease) stage V requiring chronic dialysis (MWF) 02/08/2008   PCP:  [name], MD Pharmacy:   [id number] [place] [address] [address]Phone: [phone]: [phone] [facility] [place] [id number]: [phone]: [phone] [facility] [address]Phone: [phone]: [phone]    Social Determinants of Health (SDOH) Interventions    Readmission Risk Interventions Readmission Risk Prevention Plan 01/23/2019 01/20/2019  Transportation Screening -  Complete  Medication Review (Press photographer - Complete  PCP or Specialist appointment within 3-5 days of discharge - Not Complete  PCP/Specialist Appt Not Complete comments - Not medically stable for discharge  [place]- Complete  SW Recovery Care/Counseling Consult - Complete  [place] Applicable Complete  Some recent data might be hidden

## 2020-08-07 NOTE — Progress Notes (Signed)
prog  NAME:  Brenda Davis, MRN:  [id number], DOB:  [birth year], LOS: 1 ADMISSION DATE:  07/19/2020, CONSULTATION DATE:  08/05/2020 REFERRING MD:  Dr. Johnney Killian, CHIEF COMPLAINT:  PEA arrest    History of Present Illness:  Brenda Davis is a 55 y.o. female with a PMH significant for ESRD on dialysis at home 4 days a week , CAD s/p PCI with DES, HTN, HLD, type 2 diabetes, morbid obesity, and anemia who was at an outpatient endoscopy procedure where she suffered a cardiac arrest. Per family she was undergoing a planned colonoscopy and endoscopy for a history of persistent nausea and vomiting. Per report patient had tolerated procedure well until she suddenly suffered a PEA cardiac arrest. Arrest time was estimated at 10 mins, she received 2 round of epi and compressions. Patient was intubated during arrest. Spouse denies any other acute complaints prior to admission.   Pertinent  Medical History  ESRD on dialysis at home 4 days a week , CAD s/p PCI with DES, HTN, HLD, type 2 diabetes, morbid obesity, and anemia  Significant Hospital Events:  7/19 admitted after 10 min PEA arrest post outpatient endocscopy   Interim History / Subjective:  No acute events overnight  Remains off sedation with response to pain this AM Spouse and Mother at bedside   Objective   Blood pressure (!) 156/87, pulse 86, temperature 97.6 F (36.4 C), temperature source Axillary, resp. rate 16, height 5' 5"  (1.651 m), weight 123.4 kg, SpO2 100 %.    Vent Mode: PRVC FiO2 (%):  [40 %-100 %] 40 % Set Rate:  [16 bmp] 16 bmp Vt Set:  [450 mL] 450 mL PEEP:  [5 cmH20] 5 cmH20 Plateau Pressure:  [19 cmH20-24 cmH20] 20 cmH20   Intake/Output Summary (Last 24 hours) at 08/07/2020 0802 Last data filed at 08/07/2020 0500 Gross per 24 hour  Intake 956.22 ml  Output 35 ml  Net 921.22 ml   Filed Weights   08/07/20 0500  Weight: 123.4 kg    Examination: General: Chronically ill appearing obese elderly  female lying in bed on mechanical ventilation, in NAD HEENT: ETT, MM pink/moist, PERRL,  Neuro: Responsive to pain only, no currently on sedation  CV: s1s2 regular rate and rhythm, no murmur, rubs, or gallops,  PULM:  Clear to ascultation, no increased work of breathing, no added breath sounds  GI: soft, bowel sounds active in all 4 quadrants, non-tender, non-distended, tolerating TF Extremities: warm/dry, non-pitting lower extremity edema  Skin: no rashes or lesions  Resolved Hospital Problem list   Lactic acidosis    Assessment & Plan:  S/p cardiopulmonary arrest. PEA -Seemingly 2/2 hypoxia mediated event in high risk pt ESRD, piror CAD w/ DES, MO ans sleep apnea. -Remains hemodynamically stable  P: ECHO pending  Continuous telemetry  Normothermia protocol  Troponin with flat trend   Acute hypoxic respiratory failure s/p cardiac arrest. Suspect complicated by aspiration event/aspiration PNA.  -Could also reflect asymmetric Pulm edema P: Continue ventilator support with lung protective strategies  Wean PEEP and FiO2 for sats greater than 90%. Head of bed elevated 30 degrees. Plateau pressures less than 30 cm H20.  Follow intermittent chest x-ray and ABG.   SAT/SBT as tolerated, mentation preclude extubation  Ensure adequate pulmonary hygiene  Follow cultures  VAP bundle in place  PAD protocol Empiric Unasyn  Will need to consider ETT exchange (currently has a 6 ETT with difficulties suctioning) if prolong intubation is likely    Acute metabolic encephalopathy  s/p cardiac arrest. -estimated time to ROSC 10 minutes -initial CT head neg P: Pending neurological recovery may need to consider Neuro consult  Maintain neuro protective measures; goal for eurothermia, euglycemia, eunatermia, normoxia, and PCO2 goal of 35-40 Nutrition and bowel regiment  Seizure precautions  Aspirations precautions  New facial twitching seen this AM by bedside nurse, will repeat EEG   ESRD HD  dependent (4 times a week HOME HD) P: Nephrology following, plans for [facility] 7/20 Follow renal function  Monitor urine output Trend Bmet Avoid nephrotoxins, ensure adequate renal perfusion    Fluid, electrolyte and acid base  imbalance: hyperKalemia, hyponatremia  -lacate likely the effect of cardiac arrest, cleared 7/20 P: Trend Bmet Supplement as needed    H/o CAD w/ prior MI requiring PCI and DES H/o HTN & HLD P: Continue Brilinta and Coreg  Home Imdur and Cardizem remains on hold Continuous telemetry    H/o diabetes type II P: SSI CBG q4   H/o anemia (reported as FESO4 def) -No evidence of bleeding P: Trend CBC  Transfuse per protocol    Best Practice    Diet/type: tubefeeds DVT prophylaxis: prophylactic heparin  GI prophylaxis: PPI Lines: N/A Foley:  N/A Code Status:  full code Last date of multidisciplinary goals of care discussion: Family updated at bedside   Critical care time:    Performed by: March Joos D. Harris  Total critical care time: 38  minutes  Critical care time was exclusive of separately billable procedures and treating other patients.  Critical care was necessary to treat or prevent imminent or life-threatening deterioration.  Critical care was time spent personally by me on the following activities: development of treatment plan with patient and/or surrogate as well as nursing, discussions with consultants, evaluation of patient's response to treatment, examination of patient, obtaining history from patient or surrogate, ordering and performing treatments and interventions, ordering and review of laboratory studies, ordering and review of radiographic studies, pulse oximetry and re-evaluation of patient's condition.  Hadrian Yarbrough D. Kenton Kingfisher, NP-C [facility] Personal contact information can be found on Amion  08/07/2020, 8:02 AM

## 2020-08-08 ENCOUNTER — Telehealth: Payer: Self-pay

## 2020-08-08 DIAGNOSIS — I469 Cardiac arrest, cause unspecified: Secondary | ICD-10-CM | POA: Diagnosis not present

## 2020-08-08 LAB — TROPONIN I (HIGH SENSITIVITY): Troponin I (High Sensitivity): 4144 ng/L (ref <18)

## 2020-08-08 LAB — CBC
HCT: 31.4 % — ABNORMAL LOW (ref 36.0–46.0)
Hemoglobin: 10.2 g/dL — ABNORMAL LOW (ref 12.0–15.0)
MCH: 31.4 pg (ref 26.0–34.0)
MCHC: 32.5 g/dL (ref 30.0–36.0)
MCV: 96.6 fL (ref 80.0–100.0)
Platelets: 249 10*3/uL (ref 150–400)
RBC: 3.25 MIL/uL — ABNORMAL LOW (ref 3.87–5.11)
RDW: 16.5 % — ABNORMAL HIGH (ref 11.5–15.5)
WBC: 14.4 10*3/uL — ABNORMAL HIGH (ref 4.0–10.5)
nRBC: 0 % (ref 0.0–0.2)

## 2020-08-08 LAB — COMPREHENSIVE METABOLIC PANEL
ALT: 45 U/L — ABNORMAL HIGH (ref 0–44)
AST: 36 U/L (ref 15–41)
Albumin: 2.8 g/dL — ABNORMAL LOW (ref 3.5–5.0)
Alkaline Phosphatase: 66 U/L (ref 38–126)
Anion gap: 14 (ref 5–15)
BUN: 54 mg/dL — ABNORMAL HIGH (ref 6–20)
CO2: 26 mmol/L (ref 22–32)
Calcium: 8.7 mg/dL — ABNORMAL LOW (ref 8.9–10.3)
Chloride: 93 mmol/L — ABNORMAL LOW (ref 98–111)
Creatinine, Ser: 10.06 mg/dL — ABNORMAL HIGH (ref 0.44–1.00)
GFR, Estimated: 4 mL/min — ABNORMAL LOW (ref >60)
Glucose, Bld: 155 mg/dL — ABNORMAL HIGH (ref 70–99)
Potassium: 4.9 mmol/L (ref 3.5–5.1)
Sodium: 133 mmol/L — ABNORMAL LOW (ref 135–145)
Total Bilirubin: 0.7 mg/dL (ref 0.3–1.2)
Total Protein: 6.2 g/dL — ABNORMAL LOW (ref 6.5–8.1)

## 2020-08-08 LAB — GLUCOSE, CAPILLARY
Glucose-Capillary: 152 mg/dL — ABNORMAL HIGH (ref 70–99)
Glucose-Capillary: 151 mg/dL — ABNORMAL HIGH (ref 70–99)
Glucose-Capillary: 151 mg/dL — ABNORMAL HIGH (ref 70–99)
Glucose-Capillary: 171 mg/dL — ABNORMAL HIGH (ref 70–99)
Glucose-Capillary: 134 mg/dL — ABNORMAL HIGH (ref 70–99)
Glucose-Capillary: 167 mg/dL — ABNORMAL HIGH (ref 70–99)

## 2020-08-08 LAB — HEPARIN LEVEL (UNFRACTIONATED): Heparin Unfractionated: 0.45 IU/mL (ref 0.30–0.70)

## 2020-08-08 MED ORDER — ASPIRIN 81 MG PO CHEW
81.0000 mg | CHEWABLE_TABLET | Freq: Every day | ORAL | Status: DC
  Administered 2020-08-08 – 2020-08-15 (×8): 81 mg
  Filled 2020-08-08 (×8): qty 1

## 2020-08-08 MED ORDER — HEPARIN BOLUS VIA INFUSION
4000.0000 [IU] | Freq: Once | INTRAVENOUS | Status: AC
  Administered 2020-08-08: 4000 [IU] via INTRAVENOUS
  Filled 2020-08-08: qty 4000

## 2020-08-08 MED ORDER — HEPARIN (PORCINE) 25000 UT/250ML-% IV SOLN
1300.0000 [IU]/h | INTRAVENOUS | Status: DC
  Administered 2020-08-08 – 2020-08-09 (×2): 1300 [IU]/h via INTRAVENOUS
  Filled 2020-08-08 (×2): qty 250

## 2020-08-08 MED ORDER — FENTANYL CITRATE (PF) 100 MCG/2ML IJ SOLN
25.0000 ug | INTRAMUSCULAR | Status: DC | PRN
  Administered 2020-08-08 – 2020-08-09 (×2): 50 ug via INTRAVENOUS
  Administered 2020-08-09 (×2): 25 ug via INTRAVENOUS
  Administered 2020-08-12: 100 ug via INTRAVENOUS
  Filled 2020-08-08 (×6): qty 2

## 2020-08-08 NOTE — Telephone Encounter (Signed)
Upon chart review to do follow up call patient is still currently an admitted patient in the hospital.

## 2020-08-08 NOTE — Progress Notes (Signed)
[facility] Progress Note Patient Name: Brenda Davis DOB: [birth year] MRN: [id number]   Date of Service  08/08/2020  HPI/Events of Note  Nursing request for Fentanyl IV PRN to place bite block.  eICU Interventions  Plan: Fentanyl 25-100 mcg IV Q 4 hours PRN agitation.     Intervention Category Major Interventions: Delirium, psychosis, severe agitation - evaluation and management  Leahna Hewson [place] 08/08/2020, 9:52 PM

## 2020-08-08 NOTE — Progress Notes (Signed)
[facility]   Subjective: No acute events overnight.  Weaned off sedation again this afternoon.  Was on Levophed earlier today, but holding now.  HD completed this morning.   Objective: Vital signs in last 24 hours: Temp:  [97.9 F (36.6 C)-99.3 F (37.4 C)] 98.2 F (36.8 C) (07/21 1507) Pulse Rate:  [60-86] 72 (07/21 1600) Resp:  [0-19] 17 (07/21 1600) BP: (78-149)/(28-91) 107/59 (07/21 1600) SpO2:  [100 %] 100 % (07/21 1600) FiO2 (%):  [40 %] 40 % (07/21 1600) Weight:  [123.7 kg-126.7 kg] 123.7 kg (07/21 1117) Last BM Date: 08/07/20 General: NAD, intubated Lungs: CTA b/l, no w/r/r Heart:  RRR, no m/r/g Abdomen:  Soft, NT, ND    Intake/Output from previous day: 07/20 0701 - 07/21 0700 In: 1757.3 [I.V.:303.4; NG/GT:846.7; IV Piggyback:607.2] Out: -  Intake/Output this shift: Total I/O In: 1077.1 [I.V.:172.1; NG/GT:805; IV Piggyback:100] Out: 3000 [Other:3000]   Lab Results: Recent Labs    08/12/2020 1451 08/07/20 0447 08/08/20 0142  WBC 7.9 12.8* 14.4*  HGB 9.6* 11.0* 10.2*  PLT 240 261 249  MCV 97.8 93.5 96.6   BMET Recent Labs    08/16/2020 1254 07/31/2020 1255 08/16/2020 1427 07/25/2020 1451 08/07/20 0447 08/08/20 0142  NA 133* 131* 132*  --  133* 133*  K 5.2* 5.1 5.1  --  4.9 4.9  CL 90* 94*  --   --  92* 93*  CO2 29  --   --   --  25 26  GLUCOSE 168* 144*  --   --  130* 155*  BUN 28* 32*  --   --  38* 54*  CREATININE 7.78* 7.60*  --  8.09* 8.29* 10.06*  CALCIUM 9.4  --   --   --  9.6 8.7*   LFT Recent Labs    07/29/2020 1254 08/08/20 0142  PROT 7.1 6.2*  ALBUMIN 3.5 2.8*  AST 83* 36  ALT 78* 45*  ALKPHOS 72 66  BILITOT 0.7 0.7   PT/INR Recent Labs    07/19/2020 1254  INR 1.2    Assessment and Plan:  1) PEA arrest - Patient suffered PEA arrest during colonoscopy on 7/19 s/p ACLS, intubation, epinephrine with ROSC within 10 minutes  2) Acute respiratory failure -2/2 above PEA arrest.  Patient seems to be demonstrating  increased ability to breathe with decreased ventilator support - Seems to be tolerating sedation holiday per nursing staff this afternoon - Continued care and timing of potential extubation per CCS - EEG without seizure activity.  CT head and brain MRI otherwise unrevealing  3) ESRD - Patient had HD today  4) History of CAD 5) CHF 6) Hypotension - Levophed earlier today, but titrated off per d/w nursing staff - Cardiology service consulted and saw patient earlier today - Interestingly, during my conversation with her husband by phone today, he relates that she has been having some cardiopulmonary symptoms lately, to include DOE, inability to sleep supine due to SOB, intermittent palpitations, along with intermittent hypotension with home dialysis.  As above, I called patient's husband to discuss her current clinical condition.  Allowed him to discuss his concerns and answered his questions to the best of my ability.  GI service will continue to follow along for patient/family support.  Lavena Bullion, DO  08/08/2020, 4:59 PM [facility] Pager [phone]

## 2020-08-08 NOTE — Progress Notes (Signed)
ANTICOAGULATION CONSULT NOTE - Follow-Up Consult  Pharmacy Consult for Heparin Indication: chest pain/ACS  Patient Measurements: Height: 5' 5"  (165.1 cm) Weight: 123.7 kg (272 lb 11.3 oz) IBW/kg (Calculated) : 57 Heparin Dosing Weight: 87.2 kg   Vital Signs: Temp: 99.5 F (37.5 C) (07/21 1934) Temp Source: Oral (07/21 1934) BP: 104/61 (07/21 2030) Pulse Rate: 70 (07/21 2030)  Labs: Recent Labs    08/11/2020 1254 08/16/2020 1255 07/23/2020 1451 08/07/20 0447 08/08/20 0142 08/08/20 1030 08/08/20 2109  HGB 10.7*   < > 9.6* 11.0* 10.2*  --   --   HCT 34.2*   < > 30.5* 33.1* 31.4*  --   --   PLT 243  --  240 261 249  --   --   LABPROT 15.3*  --   --   --   --   --   --   INR 1.2  --   --   --   --   --   --   HEPARINUNFRC  --   --   --   --   --   --  0.45  CREATININE 7.78*   < > 8.09* 8.29* 10.06*  --   --   TROPONINIHS 18*  --  94*  --   --  4,144*  --    < > = values in this interval not displayed.     Estimated Creatinine Clearance: 8.3 mL/min (A) (by C-G formula based on SCr of 10.06 mg/dL (H)).  Assessment: 55 yo admitted 07/30/2020 s/p OSH cardiac arrest during colonoscopy and endoscopy. Patient with PMH of CAD s/p PCI and DES in 12/2018 due to NSTEMI and found to have stent restenosis in [place] in 02/2019. Pharmacy consulted to dose heparin for concern of ACS. Troponin increased from 94 on 7/19 PM to 4,144 today. ECHO with EF 45-50% and new apical hypokinesis - cardiology consulted. No anticoagulation prior to admission. Hgb 10.2. Plt wnl. No bleeding noted. Last dose SQ heparin given 7/21 ~0500.  Heparin level this evening (via foot stick) is therapeutic (HL 0.45, goal of 0.3-0.7). No bleeding or issues noted per RN. Will recheck with AM labs to confirm therapeutic.   Goal of Therapy:  Heparin level 0.3-0.7 units/ml Monitor platelets by anticoagulation protocol: Yes   Plan:  - Continue Heparin at 1300 units/hr - Will continue to monitor for any signs/symptoms of bleeding  and will follow up with heparin level in the a.m to confirm therapeutic   Thank you for allowing pharmacy to be a part of this patient's care.  Alycia Rossetti, PharmD, BCPS Clinical Pharmacist Clinical phone for 08/08/2020: [phone] 08/08/2020 10:10 PM   **Pharmacist phone directory can now be found on [email or web address] ([email or web address]).  Listed under [place].

## 2020-08-08 NOTE — Consult Note (Addendum)
Cardiology Consultation:   Patient ID: Brenda Davis MRN: [id number]; DOB: [birth year]  Admit date: 07/28/2020 Date of Consult: 08/08/2020  PCP:  [name], MD   [facility] Providers Cardiologist:  Elouise Munroe, MD    Patient Profile:   Brenda Davis is a 55 y.o. female with a PMH of CAD s/p PCI/DES to RCA and OM1 12/2018 with subsequent ISR of OM1 which was medically managed, PAD s/p balloon angioplasty to cephalic arch 08/8414, HTN, HLD, DM type 2, ESRD on HD, OSA not on CPAP, and morbid obesity, who is being seen 08/08/2020 for the evaluation of abnormal echocardiogram following cardiac arrest at the request of Dr. Vaughan Browner.  History of Present Illness:   Brenda Davis was in her usual state of health until 07/22/2020 when she had a complication following a planned EGD/Colonoscopy. Patient completed the EGD , however about 1 minute into her C-scope she lost her pulse. CPR was initiated and patient was intubated. She did not have a shockable rhythm, therefore epinephrine and CPR continued. ROSC was achieved after 10 minutes of ACLS protocol. She was admitted to [facility]. MRI and EEG obtained without acute abnormalities. She remains intubated with limited responsiveness - some purposeful movements noted today but not following commands. Echocardiogram obtained showing EF 45-50%, RWMA with apical hypokinesis, mild LVH, S0YT, mild RV systolic dysfunction, mildly elevated PA pressures, and no significant valvular abnormalities. Cardiology asked to evaluate the patient.   She was last evaluated by cardiology at an outpatient visit with Dr. Oval Linsey 02/2020 at which time she had complaints of palpitations since her COVID-19 infection 01/2020. She had no anginal complaints at that time. She was started on diltiazem 17m daily to help with palpitations and was taken off midodrine as previous hypotension had resolved. She was recommended to follow-up in 1 month, though  has not been seen since that time. In the interim she requested to transition providers to Dr. AMargaretann Lovelessas noted in a telephone encounter 05/06/20.She was subsequently cleared by cardiology to undergo endoscopy/colonoscopy 07/19/2020 with plans to hold brilinta 5 days prior to her procedure.   Prior to this admission her last echocardiogram 12/2018 showed EF 65-70%, moderate LVH, G1DD, no RWMA, and no significant valvular abnormalities.  [facility] which showed 40% ostial-mid LAD stenosis followed by 75% mLAD stenosis, 55% m-dLCx stenosis, 99% ostial OM1 lesion partially in the stent and partially distal to the stent- subacute stent thrombosis, and patent previously proximal RCA stenosis which was medically managed due to high risk stent delivery during last cath 12/2018.   She remains unresponsive despite being weaned off sedating medications. Husband at bedside provides the following history. She has continued to have palpitations, predominately during home HD. She has had some intermittent low blood pressures during HD as well but otherwise BP has been stable. He reports she does have occasional chest pain during HD and takes ~3 SL nitro/mo which resolves her symptoms. He reports she has had worsening DOE over the past 6 months but no SOB at rest. He states she has occasional brief spells of dizziness but no syncope. She has chronic orthopnea and PND, though is not on CPAP for her OSA.    Past Medical History:  Diagnosis Date   Anemia    CAD (coronary artery disease)    a. cath in 02/2018 showing moderate 3-vessel CAD with 45% mid-LADm 55% LCx and 65% RCA stenosis which was not significant by FFR   ESRD  on dialysis ([place])    Gout    HCAP (healthcare-associated pneumonia) 05/06/2016   History of blood transfusion    "related to surgery"   Hyperlipidemia    Hypertension    Morbid obesity ([place])    Pain    LEFT SHOULDER PAIN - WAS SEEN AT AN URGENT CARE - GIVEN SLING  FOR COMFORT AND TOLD ROM AS TOLERATED.   Palpitations 09/24/2016   Peritonitis, dialysis-associated (HCC)    SVT (supraventricular tachycardia) ([place]) 02/28/2020   Type II diabetes mellitus ([place])    "gastric sleeve OR corrected this" (05/06/2016)    Past Surgical History:  Procedure Laterality Date   AV FISTULA PLACEMENT Left 01/03/2015   Procedure: BRACHIAL CEPHALIC ARTERIOVENOUS  FISTULA CREATION LEFT ARM;  Surgeon: Angelia Mould, MD;  Location: [place];  Service: Vascular;  Laterality: Left;   CARPAL TUNNEL RELEASE Right    [place]  09/14/2017   CORONARY STENT INTERVENTION N/A 01/17/2019   Procedure: CORONARY STENT INTERVENTION;  Surgeon: Jettie Booze, MD;  Location: [place] [facility];  Service: Cardiovascular;  Laterality: N/A;   ESOPHAGOGASTRODUODENOSCOPY N/A 09/04/2012   Procedure: ESOPHAGOGASTRODUODENOSCOPY (EGD);  Surgeon: Shann Medal, MD;  Location: [facility];  Service: General;  Laterality: N/A;  PF   ESOPHAGOGASTRODUODENOSCOPY (EGD) WITH ESOPHAGEAL DILATION N/A 09/29/2012   Procedure: ESOPHAGOGASTRODUODENOSCOPY (EGD) WITH ESOPHAGEAL DILATION;  Surgeon: Milus Banister, MD;  Location: [facility];  Service: Endoscopy;  Laterality: N/A;   FEMORAL ARTERY EXPLORATION Right 01/17/2019   Procedure: Exploration Right Groin with Primary Closure or Arteriotomy Site; Washout of Retroperitoneal Hematoma;  Surgeon: Waynetta Sandy, MD;  Location: [place];  Service: Vascular;  Laterality: Right;   INSERTION OF DIALYSIS CATHETER N/A 01/03/2015   Procedure: INSERTION OF DIALYSIS CATHETER RIGHT INTERNAL JUGULAR;  Surgeon: Angelia Mould, MD;  Location: [place];  Service: Vascular;  Laterality: N/A;   LAPAROSCOPIC GASTRIC SLEEVE RESECTION N/A 07/19/2012   Procedure: LAPAROSCOPIC SLEEVE GASTRECTOMY with EGD;  Surgeon: Madilyn Hook, DO;  Location: [facility];  Service: General;  Laterality: N/A;  laparoscopic sleeve gastrectomy with EGD    LEFT HEART CATH AND CORONARY ANGIOGRAPHY N/A 02/25/2018   Procedure: LEFT HEART CATH AND CORONARY ANGIOGRAPHY;  Surgeon: Leonie Man, MD;  Location: [place] [facility];  Service: Cardiovascular;  Laterality: N/A;   LEFT HEART CATH AND CORONARY ANGIOGRAPHY N/A 06/20/2018   Procedure: LEFT HEART CATH AND CORONARY ANGIOGRAPHY;  Surgeon: Nelva Bush, MD;  Location: [place] [facility];  Service: Cardiovascular;  Laterality: N/A;   LEFT HEART CATH AND CORONARY ANGIOGRAPHY N/A 01/16/2019   Procedure: LEFT HEART CATH AND CORONARY ANGIOGRAPHY;  Surgeon: Jettie Booze, MD;  Location: [place] [facility];  Service: Cardiovascular;  Laterality: N/A;   LEFT HEART CATH AND CORONARY ANGIOGRAPHY N/A 03/03/2019   Procedure: LEFT HEART CATH AND CORONARY ANGIOGRAPHY;  Surgeon: Jettie Booze, MD;  Location: [place] [facility];  Service: Cardiovascular;  Laterality: N/A;   PERIPHERAL VASCULAR BALLOON ANGIOPLASTY Left 08/18/2019   Procedure: PERIPHERAL VASCULAR BALLOON ANGIOPLASTY;  Surgeon: Marty Heck, MD;  Location: [place] [facility];  Service: Cardiovascular;  Laterality: Left;  arm fistula   PERIPHERAL VASCULAR CATHETERIZATION Left 05/23/2015   Procedure: Nolon Stalls;  Surgeon: Conrad Iuka, MD;  Location: [place] [facility];  Service: Cardiovascular;  Laterality: Left;  upper aRM   PERIPHERAL VASCULAR CATHETERIZATION Left 05/23/2015   Procedure: Peripheral Vascular Balloon Angioplasty;  Surgeon: Conrad Burke,  MD;  Location: [place] [facility];  Service: Cardiovascular;  Laterality: Left;  av fistula   TUBAL LIGATION  1993   UPPER GI ENDOSCOPY N/A 07/19/2012   Procedure: UPPER GI ENDOSCOPY;  Surgeon: Madilyn Hook, DO;  Location: [facility];  Service: General;  Laterality: N/A;     Home Medications:  Prior to Admission medications   Medication Sig Start Date End Date Taking? Authorizing Provider  acetaminophen (TYLENOL) 500 MG tablet Take 1,000 mg by mouth every 6 (six) hours as needed for  moderate pain or headache.    [provider]  B Complex-C-Biotin-D-FA (DIALYVITE 800 PLUS D) 800 MCG WAFR Take by mouth. 09/04/19   [provider]  calcitRIOL (ROCALTROL) 0.5 MCG capsule Take 1.5 mcg by mouth daily.  04/29/19   [provider]  calcium acetate (PHOSLO) 667 MG capsule Take 667-2,001 mg by mouth See admin instructions. Take 2001 mg with each meal and 667 mg with each snack 06/02/18   [provider]  carvedilol (COREG) 25 MG tablet Take 1 tablet (25 mg total) by mouth 2 (two) times daily. 04/23/20   Skeet Latch, MD  cinacalcet (SENSIPAR) 30 MG tablet TAKE 2 TABLETS BY MOUTH ON MON, WED & FRI AT BEDTIME 04/11/20   [provider]  diltiazem (CARDIZEM CD) 120 MG 24 hr capsule Take 1 capsule (120 mg total) by mouth daily. 02/28/20 05/28/20  Skeet Latch, MD  ferric citrate (AURYXIA) 1 GM 210 MG(Fe) tablet Take 2 tablets (420 mg total) by mouth 3 (three) times daily with meals. 01/23/19   Daune Perch, NP  gabapentin (NEURONTIN) 100 MG capsule Take 100 mg by mouth 2 (two) times daily. 10/31/18   [provider]  iron sucrose in sodium chloride 0.9 % 100 mL Iron Sucrose (Venofer) 03/01/20   [provider]  isosorbide mononitrate (IMDUR) 30 MG 24 hr tablet Take 1 tablet (30 mg total) by mouth daily. 04/24/20 07/23/20  Deberah Pelton, NP  mupirocin ointment (BACTROBAN) 2 % Apply topically. Patient not taking: Reported on 08/05/2020 09/01/19   [provider]  nitroGLYCERIN (NITROSTAT) 0.4 MG SL tablet Place 1 tablet (0.4 mg total) under the tongue every 5 (five) minutes as needed for chest pain. 07/26/20   Skeet Latch, MD  ondansetron (ZOFRAN) 4 MG tablet Take 1 tablet (4 mg total) by mouth every 8 (eight) hours as needed for nausea or vomiting. Patient not taking: Reported on 1/63/8466 5/99/35   Delora Fuel, MD  rosuvastatin (CRESTOR) 20 MG tablet Take 1 tablet (20 mg total) by mouth daily. 04/23/20   Skeet Latch, MD  thiamine (VITAMIN B-1) 50 MG tablet Take 1 tablet (50 mg total) by mouth daily. Patient not taking: Reported on 07/23/2020 03/01/20   [name], MD  ticagrelor (BRILINTA) 90 MG TABS tablet Take 1 tablet (90 mg total) by mouth 2 (two) times daily. 05/28/20   Skeet Latch, MD    Inpatient Medications: Scheduled Meds:  chlorhexidine gluconate (MEDLINE KIT)  15 mL Mouth Rinse BID   Chlorhexidine Gluconate Cloth  6 each Topical Daily   Chlorhexidine Gluconate Cloth  6 each Topical Q0600   docusate  100 mg Per Tube BID   famotidine  10 mg Per Tube Daily   feeding supplement (PROSource TF)  90 mL Per Tube TID   fentaNYL (SUBLIMAZE) injection  50 mcg Intravenous Once   insulin aspart  0-15 Units Subcutaneous Q4H   mouth rinse  15 mL Mouth Rinse 10 times per  day   multivitamin  1 tablet Per Tube Daily   rosuvastatin  20 mg Per Tube Daily   ticagrelor  90 mg Per Tube BID   Continuous Infusions:  sodium chloride Stopped (08/07/20 1647)   sodium chloride 250 mL (08/07/20 1750)   ampicillin-sulbactam (UNASYN) IV Stopped (08/07/20 1643)   feeding supplement (VITAL 1.5 CAL) 1,000 mL (08/07/20 1320)   fentaNYL infusion INTRAVENOUS Stopped (08/08/20 1134)   heparin 1,300 Units/hr (08/08/20 1400)   norepinephrine (LEVOPHED) Adult infusion Stopped (08/08/20 1147)   PRN Meds: sodium chloride, docusate, fentaNYL, hydrALAZINE, midazolam, midazolam, polyethylene glycol  Allergies:    Allergies  Allergen Reactions   Amlodipine Swelling   Atorvastatin     Elevated LFT's   Clonidine Derivatives Swelling    Limbs swell   Doxycycline Nausea And Vomiting    "I threw up for 3 hours"   Welchol [Colesevelam Hcl] Nausea Only    Social History:   Social History   Socioeconomic History   Marital status: Married    Spouse name: Not on file   Number of children: 3   Years of education: Not on file   Highest education level: Not on file  Occupational History   Occupation:  customer service    Employer: [employer]  Tobacco Use   Smoking status: Former    Packs/day: 1.00    Years: 16.00    Pack years: 16.00    Types: Cigarettes    Quit date: 01/19/2001    Years since quitting: 19.5   Smokeless tobacco: Never  Vaping Use   Vaping Use: Never used  Substance and Sexual Activity   Alcohol use: Never   Drug use: No   Sexual activity: Yes    Birth control/protection: Post-menopausal  Other Topics Concern   Not on file  Social History Narrative   Not on file   Social Determinants of Health   Financial Resource Strain: Not on file  Food Insecurity: Not on file  Transportation Needs: Not on file  Physical Activity: Not on file  Stress: Not on file  Social Connections: Not on file  Intimate Partner Violence: Not on file    Family History:    Family History  Problem Relation Age of Onset   Hypertension Mother    Mitral valve prolapse Mother    Diabetes Father    Arthritis Other    Hyperlipidemia Other    Cancer Neg Hx    Heart disease Neg Hx    Kidney disease Neg Hx    Stroke Neg Hx      ROS:  Please see the history of present illness.   All other ROS reviewed and negative.     Physical Exam/Data:   Vitals:   08/08/20 1300 08/08/20 1330 08/08/20 1400 08/08/20 1430  BP: (!) 93/48 (!) 113/57 (!) 114/38 (!) 107/44  Pulse: 80 79 72 73  Resp: 16 16 16 16   Temp:      TempSrc:      SpO2: 100% 100% 100% 100%  Weight:      Height:        Intake/Output Summary (Last 24 hours) at 08/08/2020 1454 Last data filed at 08/08/2020 1400 Gross per 24 hour  Intake 2481.75 ml  Output 3000 ml  Net -518.25 ml   Last 3 Weights 08/08/2020 08/08/2020 08/07/2020  Weight (lbs) 272 lb 11.3 oz 279 lb 5.2 oz 272 lb 0.8 oz  Weight (kg) 123.7 kg 126.7 kg 123.4 kg     Body mass  index is 45.38 kg/m.  General:  Ill appearing female laying in bed in NAD. Currently intubated HEENT: sclera anicteric Neck: no JVD Vascular: No carotid bruits; distal pulses 2+  bilaterally Cardiac:  normal S1, S2; RRR; no murmurs, rubs, or gallops Lungs: intubated, though vent currently off; clear to auscultation bilaterally, no wheezing, rhonchi or rales  Abd: soft, obese  Ext: no edema Musculoskeletal:  No deformities, BUE and BLE strength normal and equal Skin: warm and dry  Neuro: unable to assess; unresponsive  Psych:  unable to assess; unresponsive    EKG:  The EKG was personally reviewed and demonstrates: sinus rhythm with rate 91 bpm, non-specific ST-T wave abnormalities, no STE/D, no significant change from previous.  Telemetry:  Telemetry was personally reviewed and demonstrates:  sinus rhythm  Relevant CV Studies: LHC 06/20/18: Conclusions: Moderately severe multivessel coronary artery disease not significantly changed since prior catheterization in 02/2018.  DFR of the most severe lesion in the RCA at that time was not hemodynamically significant. Normal left ventricular filling pressure. Hyperdynamic left ventricular contraction.   Recommendations: Medical therapy for moderate diffuse coronary artery disease.  There is no clear culprit for PCI.  I suspect troponin elevation reflects supply-demand mismatch.  I will start isosorbide mononitrate 30 mg daily, which can be escalated as blood pressure tolerates for relief of chest pain. Aggressive secondary prevention, including high-intensity statin therapy. If no evidence of bleeding complications and no recurrence of chest pain, discharge this afternoon or tomorrow could be considered from a cardiac standpoint.     Cardiac catheterization 01/17/2019 Mid LAD lesion is 75% stenosed. The LAD was not addressed and will be managed medically. Ost LAD to Mid LAD lesion is 40% stenosed. Mid Cx to Dist Cx lesion is 55% stenosed with 40% stenosed side branch in Ost 3rd Mrg. Prox RCA lesion is 75% stenosed. A drug-eluting stent was successfully placed using a SYNERGY XD 3.0X38, postdilated to 3.5 mm. Post  intervention, there is a 0% residual stenosis. Ost 1st Mrg lesion is 99% stenosed. EBU4 Guide catheter used. A drug-eluting stent was successfully placed using a SYNERGY XD 2.25X24, postdilated to 2.75 mm. Post intervention, there is a 0% residual stenosis.   Continue DAPT for 12 months.  Would recommend clopidogrel long-term after one year since she has such diffuse disease.  Continue aggressive secondary prevention.    Echocardiogram 01/16/2019 IMPRESSIONS  1. Left ventricular ejection fraction, by visual estimation, is 65 to 70%. The left ventricle has hyperdynamic function. There is moderately increased left ventricular hypertrophy.  2. Definity contrast agent was given IV to delineate the left ventricular endocardial borders.  3. Left ventricular diastolic parameters are consistent with Grade I diastolic dysfunction (impaired relaxation).  4. The left ventricle has no regional wall motion abnormalities.  5. Global right ventricle has normal systolic function.The right ventricular size is mildly enlarged. No increase in right ventricular wall thickness.  6. Left atrial size was normal.  7. Right atrial size was normal.  8. The pericardial effusion is posterior to the left ventricle.  9. Trivial pericardial effusion is present. 10. The mitral valve was not well visualized. Trivial mitral valve regurgitation. 11. The tricuspid valve is not well visualized. 12. The aortic valve is tricuspid. Aortic valve regurgitation is not visualized. Mild aortic valve sclerosis without stenosis. 13. The pulmonic valve was grossly normal. Pulmonic valve regurgitation is not visualized. 14. The inferior vena cava is normal in size with <50% respiratory variability, suggesting right atrial pressure of 8 mmHg.  15. The interatrial septum was not well visualized.  Cardiac Cath 03/03/19 Mid LAD lesion is 75% stenosed. Ost LAD to Mid LAD lesion is 40% stenosed. Previously placed Ost 1st Mrg drug eluting stent  is widely patent. Mid Cx to Dist Cx lesion is 55% stenosed with 40% stenosed side branch in Ost 3rd Mrg. Previously placed Prox RCA drug eluting stent is widely patent. Ost 1st Mrg to 1st Mrg lesion is 99% stenosed. Partially in the stent and partially distal to the stent- subacte stent thrombosis. LV end diastolic pressure is normal. There is no aortic valve stenosis. Medical therapy for stent thrombosis of the OM stent.  Given the severe tortuosity, I don't think repeat PCI attempt would be beneficial.  There was significant difficulty getting a stent delivered during the last cath.  I think there would be a high likelihood of completely occluding the vessel with wire manipulation  Echocardiogram 08/07/20: 1. Left ventricular ejection fraction, by estimation, is 45 to 50%. The  left ventricle has mildly decreased function. The left ventricle  demonstrates regional wall motion abnormalities with apical hypokinesis.  There is mild left ventricular hypertrophy.   Left ventricular diastolic parameters are consistent with Grade I  diastolic dysfunction (impaired relaxation).   2. Right ventricular systolic function is mildly reduced. The right  ventricular size is mildly enlarged. There is mildly elevated pulmonary  artery systolic pressure. The estimated right ventricular systolic  pressure is 56.2 mmHg.   3. The mitral valve is normal in structure. Trivial mitral valve  regurgitation. No evidence of mitral stenosis.   4. The aortic valve is tricuspid. Aortic valve regurgitation is not  visualized. Mild aortic valve sclerosis is present, with no evidence of  aortic valve stenosis.   5. The inferior vena cava is dilated in size with >50% respiratory  variability, suggesting right atrial pressure of 8 mmHg.   Laboratory Data:  High Sensitivity Troponin:   Recent Labs  Lab 07/25/2020 1254 08/02/2020 1451 08/08/20 1030  TROPONINIHS 18* 94* 4,144*     Chemistry Recent Labs  Lab  07/20/2020 1254 08/02/2020 1255 07/27/2020 1427 08/10/2020 1451 08/07/20 0447 08/08/20 0142  NA 133* 131* 132*  --  133* 133*  K 5.2* 5.1 5.1  --  4.9 4.9  CL 90* 94*  --   --  92* 93*  CO2 29  --   --   --  25 26  GLUCOSE 168* 144*  --   --  130* 155*  BUN 28* 32*  --   --  38* 54*  CREATININE 7.78* 7.60*  --  8.09* 8.29* 10.06*  CALCIUM 9.4  --   --   --  9.6 8.7*  GFRNONAA 6*  --   --  5* 5* 4*  ANIONGAP 14  --   --   --  16* 14    Recent Labs  Lab 08/12/2020 1254 08/08/20 0142  PROT 7.1 6.2*  ALBUMIN 3.5 2.8*  AST 83* 36  ALT 78* 45*  ALKPHOS 72 66  BILITOT 0.7 0.7   Hematology Recent Labs  Lab 07/25/2020 1451 08/07/20 0447 08/08/20 0142  WBC 7.9 12.8* 14.4*  RBC 3.12* 3.54* 3.25*  HGB 9.6* 11.0* 10.2*  HCT 30.5* 33.1* 31.4*  MCV 97.8 93.5 96.6  MCH 30.8 31.1 31.4  MCHC 31.5 33.2 32.5  RDW 16.3* 16.2* 16.5*  PLT 240 261 249   BNPNo results for input(s): BNP, PROBNP in the last 168 hours.  DDimer No results for input(s):  DDIMER in the last 168 hours.   Radiology/Studies:  CT Head Wo Contrast  Result Date: 08/03/2020 CLINICAL DATA:  Mental status change. EXAM: CT HEAD WITHOUT CONTRAST TECHNIQUE: Contiguous axial images were obtained from the base of the skull through the vertex without intravenous contrast. COMPARISON:  MRI 04/21/2018 FINDINGS: Exam limited by patient motion. Brain: No evidence of acute infarction, hemorrhage, hydrocephalus, extra-axial collection or mass lesion/mass effect. Vascular: Scattered vascular calcifications.  No hyperdense vessels. Skull: No skull fracture or bone lesion. Sinuses/Orbits: The paranasal sinuses and mastoid air cells are clear. The globes are intact. Other: No scalp hematoma or lesion. IMPRESSION: No acute intracranial findings or mass lesions. Electronically Signed   By: Marijo Sanes M.D.   On: 07/27/2020 14:38   MR BRAIN W WO CONTRAST  Result Date: 08/07/2020 CLINICAL DATA:  Mental status change. Concern for anoxic brain  injury. EXAM: MRI HEAD WITHOUT AND WITH CONTRAST TECHNIQUE: Multiplanar, multiecho pulse sequences of the brain and surrounding structures were obtained without and with intravenous contrast. CONTRAST:  57m GADAVIST GADOBUTROL 1 MMOL/ML IV SOLN COMPARISON:  CT head August 06, 2020.  MRI April 21, 2018. FINDINGS: Brain: No acute infarction, hemorrhage, hydrocephalus, extra-axial collection or mass lesion. Mild scattered small T2/FLAIR hyperintense lesions within the white matter, nonspecific but most likely related to mild chronic microvascular ischemic disease. No abnormal enhancement. Vascular: Major arterial flow voids are maintained skull base. Skull and upper cervical spine: Diffuse T1 hypointensity of the marrow without discrete marrow replacing lesion. Sinuses/Orbits: Mild sinus mucosal thickening.  Unremarkable orbits. Other: Small right greater than left mastoid effusions. IMPRESSION: 1. No evidence of acute intracranial abnormality. 2. Mild chronic microvascular ischemic disease. 3. Diffuse T1 hypointensity of the marrow, which is nonspecific but can be seen with chronic anemia, chronic hypoxia (such as in smokers), and lymphoproliferative disorders. Electronically Signed   By: FMargaretha SheffieldMD   On: 08/07/2020 15:49   DG Chest Port 1 View  Result Date: 07/20/2020 CLINICAL DATA:  Patient status post CPR today. EXAM: PORTABLE CHEST 1 VIEW COMPARISON:  PA and lateral chest 04/04/2019. FINDINGS: Endotracheal tube is in place with the tip in good position at the level of the clavicular heads. Extensive airspace disease is present throughout the right chest. There is more patchy airspace disease in the left chest, most notable in the left base. Defibrillator pad is in place. No pneumothorax or pleural effusion is identified. Heart size is normal. No bony abnormality. IMPRESSION: ETT in good position. Right much worse than left airspace disease could be due to pneumonia or aspiration. Electronically Signed    By: TInge RiseM.D.   On: 08/11/2020 13:46   EEG adult  Result Date: 08/07/2020 YLora Havens MD     08/07/2020  2:34 PM Patient Name: Brenda GORLEYMRN: [id number] Attending: PLora HavensReferring Physician/Provider: WGerald Leitz NP Date: 08/07/2020 Duration: 23.46 mins Patient history: 89yoF s/p cardiac arrest. EEG to evaluate for seizure.  Level of alertness:  comatose  AEDs during EEG study: GBP  Technical aspects: This EEG study was done with scalp electrodes positioned according to the 10-20 International system of electrode placement. Electrical activity was acquired at a sampling rate of 500Hz  and reviewed with a high frequency filter of 70Hz  and a low frequency filter of 1Hz . EEG data were recorded continuously and digitally stored.  Description: EEG showed continuous generalized amplitude sharply contoured 3 to 5 Hz theta-delta slowing, at times with triphasic morphology.  Hyperventilation and  photic stimulation were not performed.     ABNORMALITY -Continuous slow, generalized  IMPRESSION: This study is suggestive of severe diffuse encephalopathy, nonspecific etiology. No seizures or epileptiform discharges were seen throughout the recording. EEG appears to be improving compared to previous day.   Lora Havens   EEG adult  Result Date: 07/21/2020 Lora Havens, MD     07/25/2020  5:48 PM Patient Name: Brenda Davis MRN: [id number] Epilepsy Attending: Lora Havens Referring Physician/Provider: Gerald Leitz, NP Date: 07/23/2020 Duration: 24.42 mins Patient history: 55yo F s/p cardiac arrest. EEG to evaluate for seizure. Level of alertness:  comatose AEDs during EEG study: GBP Technical aspects: This EEG study was done with scalp electrodes positioned according to the 10-20 International system of electrode placement. Electrical activity was acquired at a sampling rate of 500Hz  and reviewed with a high frequency filter of 70Hz  and a low  frequency filter of 1Hz . EEG data were recorded continuously and digitally stored. Description: EEG showed continuous generalized background attenuation.  Hyperventilation and photic stimulation were not performed.   Of note, EEG was technically difficult due to significant myogenic artifact. ABNORMALITY - Background attenuation, generalized IMPRESSION: This technically difficult study is suggestive of profound diffuse encephalopathy, nonspecific etiology. No seizures or epileptiform discharges were seen throughout the recording. If suspicion for  ictal-interictal activity remains a concern, a repeat study with neuromuscular blockade can be considered. Lora Havens   ECHOCARDIOGRAM COMPLETE  Result Date: 08/07/2020    ECHOCARDIOGRAM REPORT   Patient Name:   Brenda Davis Date of Exam: 08/07/2020 Medical Rec #:  [id number]                 Height:       65.0 in Accession #:    [id number]                Weight:       272.0 lb Date of Birth:  September 08, 1965                BSA:          2.254 m Patient Age:    55 years                  BP:           158/94 mmHg Patient Gender: F                         HR:           86 bpm. Exam Location:  Inpatient Procedure: 2D Echo, Cardiac Doppler, Color Doppler and Intracardiac            Opacification Agent Indications:    Cardiac arrest  History:        Patient has prior history of Echocardiogram examinations, most                 recent 01/16/2019. Previous Myocardial Infarction and CAD,                 Arrythmias:Cardiac Arrest, Signs/Symptoms:ESRD; Risk                 Factors:Hypertension, Diabetes, Dyslipidemia and Morbid obesity.  Sonographer:    Dustin Flock Referring Phys: [phone] WHITNEY D HARRIS  Sonographer Comments: Technically difficult study due to poor echo windows and patient is morbidly obese. IMPRESSIONS  1. Left ventricular ejection fraction, by estimation, is 45 to 50%. The left ventricle has  mildly decreased function. The left ventricle  demonstrates regional wall motion abnormalities with apical hypokinesis. There is mild left ventricular hypertrophy.  Left ventricular diastolic parameters are consistent with Grade I diastolic dysfunction (impaired relaxation).  2. Right ventricular systolic function is mildly reduced. The right ventricular size is mildly enlarged. There is mildly elevated pulmonary artery systolic pressure. The estimated right ventricular systolic pressure is 74.2 mmHg.  3. The mitral valve is normal in structure. Trivial mitral valve regurgitation. No evidence of mitral stenosis.  4. The aortic valve is tricuspid. Aortic valve regurgitation is not visualized. Mild aortic valve sclerosis is present, with no evidence of aortic valve stenosis.  5. The inferior vena cava is dilated in size with >50% respiratory variability, suggesting right atrial pressure of 8 mmHg. FINDINGS  Left Ventricle: Left ventricular ejection fraction, by estimation, is 45 to 50%. The left ventricle has mildly decreased function. The left ventricle demonstrates regional wall motion abnormalities. Definity contrast agent was given IV to delineate the left ventricular endocardial borders. The left ventricular internal cavity size was normal in size. There is mild left ventricular hypertrophy. Left ventricular diastolic parameters are consistent with Grade I diastolic dysfunction (impaired relaxation). Right Ventricle: The right ventricular size is mildly enlarged. No increase in right ventricular wall thickness. Right ventricular systolic function is mildly reduced. There is mildly elevated pulmonary artery systolic pressure. The tricuspid regurgitant  velocity is 3.23 m/s, and with an assumed right atrial pressure of 3 mmHg, the estimated right ventricular systolic pressure is 59.5 mmHg. Left Atrium: Left atrial size was normal in size. Right Atrium: Right atrial size was normal in size. Pericardium: Trivial pericardial effusion is present. Mitral Valve: The  mitral valve is normal in structure. Mild mitral annular calcification. Trivial mitral valve regurgitation. No evidence of mitral valve stenosis. Tricuspid Valve: The tricuspid valve is normal in structure. Tricuspid valve regurgitation is trivial. Aortic Valve: The aortic valve is tricuspid. Aortic valve regurgitation is not visualized. Mild aortic valve sclerosis is present, with no evidence of aortic valve stenosis. Pulmonic Valve: The pulmonic valve was normal in structure. Pulmonic valve regurgitation is trivial. Aorta: The aortic root is normal in size and structure. Venous: The inferior vena cava is dilated in size with greater than 50% respiratory variability, suggesting right atrial pressure of 8 mmHg. IAS/Shunts: No atrial level shunt detected by color flow Doppler.  LEFT VENTRICLE PLAX 2D LVIDd:         4.80 cm  Diastology LVIDs:         3.10 cm  LV e' medial:    0.04 cm/s LV PW:         1.20 cm  LV E/e' medial:  28.4 LV IVS:        1.30 cm  LV e' lateral:   0.05 cm/s LVOT diam:     2.10 cm  LV E/e' lateral: 19.3 LV SV:         49 LV SV Index:   22 LVOT Area:     3.46 cm  RIGHT VENTRICLE RV Basal diam:  3.70 cm RV S prime:     7.83 cm/s TAPSE (M-mode): 1.2 cm LEFT ATRIUM             Index       RIGHT ATRIUM           Index LA diam:        3.20 cm 1.42 cm/m  RA Area:     13.70 cm LA Vol (A2C):  36.7 ml 16.28 ml/m RA Volume:   34.00 ml  15.08 ml/m LA Vol (A4C):   64.3 ml 28.52 ml/m LA Biplane Vol: 54.1 ml 24.00 ml/m  AORTIC VALVE LVOT Vmax:   79.20 cm/s LVOT Vmean:  52.500 cm/s LVOT VTI:    0.142 m  AORTA Ao Root diam: 2.70 cm MITRAL VALVE                TRICUSPID VALVE MV Area (PHT): 7.44 cm     TR Peak grad:   41.7 mmHg MV Decel Time: 102 msec     TR Vmax:        323.00 cm/s MV E velocity: 1.05 cm/s MV A velocity: 112.00 cm/s  SHUNTS MV E/A ratio:  0.01         Systemic VTI:  0.14 m                             Systemic Diam: 2.10 cm Loralie Champagne MD Electronically signed by Loralie Champagne MD  Signature Date/Time: 08/07/2020/2:59:45 PM    Final      Assessment and Plan:   1. PEA arrest in patient with CAD s/p PCI/DES to RCA and OM1 12/2018: patient went pulseless while undergoing colonoscopy 08/01/2020. She underwent 10 minutes of ACLS including CPR and epinephrine x2 given non-shockable rhythm and ultimately achieved ROSC after 10 minutes. She remains intubated with some purposeful movements but not following commands. Echo obtained revealing EF 45-50% (previously 65-70% 12/2018), RWMA with apical hypokinesis, mild LVH, M7EH, mild RV systolic dysfunction, mildly elevated PA pressures, and no significant valvular abnormalities. HsTrop 18>94 on admission 07/24/2020, up to 4144 08/08/20. EKG is not significantly changed from previous. Previously recommended for CABG, however patient preferred PCI for management of her multivessel CAD. - Would likely benefit from a heart catheterization if she makes a meaningful recovery  to r/o obstructive disease - Can continue heparin gtt for now - Continue brilinta - Continue statin - Anticipate restarting Bblocker once BP stable  2. Acute combined CHF: Echo this admission revealed EF 45-50%, down from 65-70% 12/2018. Volume status is being managed by dialysis, maintaining almost net nil status following HD today. She appears euvolemic - Resume carvedilol once clear BP is stable - Continue volume management per HD  3. HTN: BP stable - weaned off pressor support this afternoon - Ultimately anticipate management int he context of #2  4. HLD: No recent lipids - Will check FLP in AM - Continue crestor  5. DM type 2: A1C 5.5 this admission; at goal of <7 - Continue management per primary team   6. ESRD on HD: nephrology following for management of HD.  - Continue management per HD.   7. OSA: known history though not on CPAP - Would benefit from repeat sleep study to pursue home CPAP   Risk Assessment/Risk Scores:   TIMI Risk Score for Unstable  Angina or Non-ST Elevation MI:   The patient's TIMI risk score is 3, which indicates a 13% risk of all cause mortality, new or recurrent myocardial infarction or need for urgent revascularization in the next 14 days.  New York Heart Association (NYHA) Functional Class Unable to assess        For questions or updates, please contact [facility] Please consult [email or web address] for contact info under    Signed, Abigail Butts, PA-C  08/08/2020 2:54 PM  Patient seen and examined with Maximino Greenland, PA-C.  Agree  as above, with the following exceptions and changes as noted below.  The patient is a 55 year old female with a history of CAD and prior PCI to the RCA and OM1 with subsequent in-stent restenosis of OM1 and medically managed mid LAD disease, peripheral arterial disease with balloon angioplasty to the cephalic artery last year, hypertension, hyperlipidemia, diabetes mellitus, ESRD on hemodialysis, OSA but not on CPAP, and morbid obesity, being seen today after PEA arrest.  Cardiology was consulted for findings on echocardiogram which suggest mildly reduced ejection fraction with regional wall motion abnormalities.  Independently reviewed the images from this echocardiogram demonstrating wall motion abnormalities in LAD distribution versus evidence of stress cardiomyopathy with apical ballooning.  Patient is currently intubated in the ICU but not purposefully responding.  Gen: Intubated, CV: RRR, no murmurs, Lungs: Clear breath sounds anteriorly, Abd: soft, Extrem: Warm, well perfused, no edema, Neuro/Psych: Intubated and not responsive.  All available labs, radiology testing, previous records reviewed.   Troponins were checked after her PEA arrest in May be reflect resuscitative efforts, however her echocardiogram is concerning for LAD regional wall motion abnormalities versus stress cardiomyopathy.  Given her residual disease in the LAD noted on prior cath, if the patient has a meaningful  neurologic recovery and is hemodynamically stable with no concern for bleeding, she may benefit from a cardiac catheterization.  Primary service has started heparin IV, though the patient is having some oral oozing and this may need to be stopped.  It would be reasonable to continue IV heparin for at least 48 hours for medical management of possible ACS.  If there is no change in her disease I would suspect that her echo findings are consistent with stress cardiomyopathy.  Patient was just recently on pressors for support, will consider addition of medical therapy for reduced EF and CAD when able.  Plan of care discussed in detail with Dr. Vaughan Browner with CCM and patient's bedside nurse.  Elouise Munroe, MD 08/08/20 6:30 PM

## 2020-08-08 NOTE — Progress Notes (Signed)
Patient ID: Brenda Davis, female   DOB: [birth year], 55 y.o.   MRN: [id number] S: Seen while on HD which was delayed until this morning due to high HD census.  She was restarted on fentanyl due to agitation.  Husband upset that she is still on sedation, however discussed that it was for her protection and she is not yet ready to be extubated due to confusion and agitation.  Plan to follow after HD and decrease sedation as tolerated. O:BP (!) 145/77 (BP Location: Right Leg)   Pulse 72   Temp 97.9 F (36.6 C) (Oral)   Resp 13   Ht 5' 5"  (1.651 m)   Wt 123.4 kg   SpO2 100%   BMI 45.27 kg/m   Intake/Output Summary (Last 24 hours) at 08/08/2020 0907 Last data filed at 08/08/2020 0900 Gross per 24 hour  Intake 1887.61 ml  Output --  Net 1887.61 ml   Intake/Output: I/O last 3 completed shifts: In: 2530 [I.V.:1026.2; NG/GT:846.7; IV Piggyback:657.2] Out: 35 [Urine:35]  Intake/Output this shift:  Total I/O In: 130.3 [I.V.:50.3; NG/GT:80] Out: -  Weight change:  YPP:JKDTOIZTI and sedated CVS:RRR Resp: ventilated BS bilaterally Abd:+BS, soft, NT/ND Ext: no edema, LUE AVF +T/B  Recent Labs  Lab 08/08/2020 1254 07/20/2020 1255 07/26/2020 1427 07/27/2020 1451 08/10/2020 1706 08/07/20 0447 08/07/20 1647 08/08/20 0142  NA 133* 131* 132*  --   --  133*  --  133*  K 5.2* 5.1 5.1  --   --  4.9  --  4.9  CL 90* 94*  --   --   --  92*  --  93*  CO2 29  --   --   --   --  25  --  26  GLUCOSE 168* 144*  --   --   --  130*  --  155*  BUN 28* 32*  --   --   --  38*  --  54*  CREATININE 7.78* 7.60*  --  8.09*  --  8.29*  --  10.06*  ALBUMIN 3.5  --   --   --   --   --   --  2.8*  CALCIUM 9.4  --   --   --   --  9.6  --  8.7*  PHOS 6.4*  --   --  5.7* 5.6* 5.1* 6.4*  --   AST 83*  --   --   --   --   --   --  36  ALT 78*  --   --   --   --   --   --  45*   Liver Function Tests: Recent Labs  Lab 07/20/2020 1254 08/08/20 0142  AST 83* 36  ALT 78* 45*  ALKPHOS 72 66  BILITOT 0.7 0.7   PROT 7.1 6.2*  ALBUMIN 3.5 2.8*   No results for input(s): LIPASE, AMYLASE in the last 168 hours. No results for input(s): AMMONIA in the last 168 hours. CBC: Recent Labs  Lab 08/08/2020 1254 07/31/2020 1255 08/08/2020 1451 08/07/20 0447 08/08/20 0142  WBC 6.1  --  7.9 12.8* 14.4*  NEUTROABS 4.2  --   --   --   --   HGB 10.7*   < > 9.6* 11.0* 10.2*  HCT 34.2*   < > 30.5* 33.1* 31.4*  MCV 98.6  --  97.8 93.5 96.6  PLT 243  --  240 261 249   < > = values in this  interval not displayed.   Cardiac Enzymes: No results for input(s): CKTOTAL, CKMB, CKMBINDEX, TROPONINI in the last 168 hours. CBG: Recent Labs  Lab 08/07/20 1601 08/07/20 1926 08/07/20 2323 08/08/20 0322 08/08/20 0718  GLUCAP 128* 163* 166* 152* 151*    Iron Studies: No results for input(s): IRON, TIBC, TRANSFERRIN, FERRITIN in the last 72 hours. Studies/Results: CT Head Wo Contrast  Result Date: 08/04/2020 CLINICAL DATA:  Mental status change. EXAM: CT HEAD WITHOUT CONTRAST TECHNIQUE: Contiguous axial images were obtained from the base of the skull through the vertex without intravenous contrast. COMPARISON:  MRI 04/21/2018 FINDINGS: Exam limited by patient motion. Brain: No evidence of acute infarction, hemorrhage, hydrocephalus, extra-axial collection or mass lesion/mass effect. Vascular: Scattered vascular calcifications.  No hyperdense vessels. Skull: No skull fracture or bone lesion. Sinuses/Orbits: The paranasal sinuses and mastoid air cells are clear. The globes are intact. Other: No scalp hematoma or lesion. IMPRESSION: No acute intracranial findings or mass lesions. Electronically Signed   By: Marijo Sanes M.D.   On: 07/19/2020 14:38   MR BRAIN W WO CONTRAST  Result Date: 08/07/2020 CLINICAL DATA:  Mental status change. Concern for anoxic brain injury. EXAM: MRI HEAD WITHOUT AND WITH CONTRAST TECHNIQUE: Multiplanar, multiecho pulse sequences of the brain and surrounding structures were obtained without and with  intravenous contrast. CONTRAST:  56m GADAVIST GADOBUTROL 1 MMOL/ML IV SOLN COMPARISON:  CT head August 06, 2020.  MRI April 21, 2018. FINDINGS: Brain: No acute infarction, hemorrhage, hydrocephalus, extra-axial collection or mass lesion. Mild scattered small T2/FLAIR hyperintense lesions within the white matter, nonspecific but most likely related to mild chronic microvascular ischemic disease. No abnormal enhancement. Vascular: Major arterial flow voids are maintained skull base. Skull and upper cervical spine: Diffuse T1 hypointensity of the marrow without discrete marrow replacing lesion. Sinuses/Orbits: Mild sinus mucosal thickening.  Unremarkable orbits. Other: Small right greater than left mastoid effusions. IMPRESSION: 1. No evidence of acute intracranial abnormality. 2. Mild chronic microvascular ischemic disease. 3. Diffuse T1 hypointensity of the marrow, which is nonspecific but can be seen with chronic anemia, chronic hypoxia (such as in smokers), and lymphoproliferative disorders. Electronically Signed   By: FMargaretha SheffieldMD   On: 08/07/2020 15:49   DG Chest Port 1 View  Result Date: 07/27/2020 CLINICAL DATA:  Patient status post CPR today. EXAM: PORTABLE CHEST 1 VIEW COMPARISON:  PA and lateral chest 04/04/2019. FINDINGS: Endotracheal tube is in place with the tip in good position at the level of the clavicular heads. Extensive airspace disease is present throughout the right chest. There is more patchy airspace disease in the left chest, most notable in the left base. Defibrillator pad is in place. No pneumothorax or pleural effusion is identified. Heart size is normal. No bony abnormality. IMPRESSION: ETT in good position. Right much worse than left airspace disease could be due to pneumonia or aspiration. Electronically Signed   By: TInge RiseM.D.   On: 08/03/2020 13:46   EEG adult  Result Date: 08/07/2020 YLora Havens MD     08/07/2020  2:34 PM Patient Name: YSTARLETT PEHRSONMRN: [id number] Attending: PLora HavensReferring Physician/Provider: WGerald Leitz NP Date: 08/07/2020 Duration: 23.46 mins Patient history: 89yoF s/p cardiac arrest. EEG to evaluate for seizure.  Level of alertness:  comatose  AEDs during EEG study: GBP  Technical aspects: This EEG study was done with scalp electrodes positioned according to the 10-20 International system of electrode placement. Electrical activity was acquired at a  sampling rate of 500Hz  and reviewed with a high frequency filter of 70Hz  and a low frequency filter of 1Hz . EEG data were recorded continuously and digitally stored.  Description: EEG showed continuous generalized amplitude sharply contoured 3 to 5 Hz theta-delta slowing, at times with triphasic morphology.  Hyperventilation and photic stimulation were not performed.     ABNORMALITY -Continuous slow, generalized  IMPRESSION: This study is suggestive of severe diffuse encephalopathy, nonspecific etiology. No seizures or epileptiform discharges were seen throughout the recording. EEG appears to be improving compared to previous day.   Lora Havens   EEG adult  Result Date: 07/24/2020 Lora Havens, MD     07/23/2020  5:48 PM Patient Name: MELINA MOSTELLER MRN: [id number] Epilepsy Attending: Lora Havens Referring Physician/Provider: Gerald Leitz, NP Date: 07/29/2020 Duration: 24.42 mins Patient history: 55yo F s/p cardiac arrest. EEG to evaluate for seizure. Level of alertness:  comatose AEDs during EEG study: GBP Technical aspects: This EEG study was done with scalp electrodes positioned according to the 10-20 International system of electrode placement. Electrical activity was acquired at a sampling rate of 500Hz  and reviewed with a high frequency filter of 70Hz  and a low frequency filter of 1Hz . EEG data were recorded continuously and digitally stored. Description: EEG showed continuous generalized background attenuation.   Hyperventilation and photic stimulation were not performed.   Of note, EEG was technically difficult due to significant myogenic artifact. ABNORMALITY - Background attenuation, generalized IMPRESSION: This technically difficult study is suggestive of profound diffuse encephalopathy, nonspecific etiology. No seizures or epileptiform discharges were seen throughout the recording. If suspicion for  ictal-interictal activity remains a concern, a repeat study with neuromuscular blockade can be considered. Lora Havens   ECHOCARDIOGRAM COMPLETE  Result Date: 08/07/2020    ECHOCARDIOGRAM REPORT   Patient Name:   DAVEENA ELMORE Date of Exam: 08/07/2020 Medical Rec #:  [id number]                 Height:       65.0 in Accession #:    [id number]                Weight:       272.0 lb Date of Birth:  Dec 30, 1965                BSA:          2.254 m Patient Age:    45 years                  BP:           158/94 mmHg Patient Gender: F                         HR:           86 bpm. Exam Location:  Inpatient Procedure: 2D Echo, Cardiac Doppler, Color Doppler and Intracardiac            Opacification Agent Indications:    Cardiac arrest  History:        Patient has prior history of Echocardiogram examinations, most                 recent 01/16/2019. Previous Myocardial Infarction and CAD,                 Arrythmias:Cardiac Arrest, Signs/Symptoms:ESRD; Risk                 Factors:Hypertension,  Diabetes, Dyslipidemia and Morbid obesity.  Sonographer:    Dustin Flock Referring Phys: [phone] WHITNEY D HARRIS  Sonographer Comments: Technically difficult study due to poor echo windows and patient is morbidly obese. IMPRESSIONS  1. Left ventricular ejection fraction, by estimation, is 45 to 50%. The left ventricle has mildly decreased function. The left ventricle demonstrates regional wall motion abnormalities with apical hypokinesis. There is mild left ventricular hypertrophy.  Left ventricular diastolic parameters are  consistent with Grade I diastolic dysfunction (impaired relaxation).  2. Right ventricular systolic function is mildly reduced. The right ventricular size is mildly enlarged. There is mildly elevated pulmonary artery systolic pressure. The estimated right ventricular systolic pressure is 82.7 mmHg.  3. The mitral valve is normal in structure. Trivial mitral valve regurgitation. No evidence of mitral stenosis.  4. The aortic valve is tricuspid. Aortic valve regurgitation is not visualized. Mild aortic valve sclerosis is present, with no evidence of aortic valve stenosis.  5. The inferior vena cava is dilated in size with >50% respiratory variability, suggesting right atrial pressure of 8 mmHg. FINDINGS  Left Ventricle: Left ventricular ejection fraction, by estimation, is 45 to 50%. The left ventricle has mildly decreased function. The left ventricle demonstrates regional wall motion abnormalities. Definity contrast agent was given IV to delineate the left ventricular endocardial borders. The left ventricular internal cavity size was normal in size. There is mild left ventricular hypertrophy. Left ventricular diastolic parameters are consistent with Grade I diastolic dysfunction (impaired relaxation). Right Ventricle: The right ventricular size is mildly enlarged. No increase in right ventricular wall thickness. Right ventricular systolic function is mildly reduced. There is mildly elevated pulmonary artery systolic pressure. The tricuspid regurgitant  velocity is 3.23 m/s, and with an assumed right atrial pressure of 3 mmHg, the estimated right ventricular systolic pressure is 07.8 mmHg. Left Atrium: Left atrial size was normal in size. Right Atrium: Right atrial size was normal in size. Pericardium: Trivial pericardial effusion is present. Mitral Valve: The mitral valve is normal in structure. Mild mitral annular calcification. Trivial mitral valve regurgitation. No evidence of mitral valve stenosis. Tricuspid  Valve: The tricuspid valve is normal in structure. Tricuspid valve regurgitation is trivial. Aortic Valve: The aortic valve is tricuspid. Aortic valve regurgitation is not visualized. Mild aortic valve sclerosis is present, with no evidence of aortic valve stenosis. Pulmonic Valve: The pulmonic valve was normal in structure. Pulmonic valve regurgitation is trivial. Aorta: The aortic root is normal in size and structure. Venous: The inferior vena cava is dilated in size with greater than 50% respiratory variability, suggesting right atrial pressure of 8 mmHg. IAS/Shunts: No atrial level shunt detected by color flow Doppler.  LEFT VENTRICLE PLAX 2D LVIDd:         4.80 cm  Diastology LVIDs:         3.10 cm  LV e' medial:    0.04 cm/s LV PW:         1.20 cm  LV E/e' medial:  28.4 LV IVS:        1.30 cm  LV e' lateral:   0.05 cm/s LVOT diam:     2.10 cm  LV E/e' lateral: 19.3 LV SV:         49 LV SV Index:   22 LVOT Area:     3.46 cm  RIGHT VENTRICLE RV Basal diam:  3.70 cm RV S prime:     7.83 cm/s TAPSE (M-mode): 1.2 cm LEFT ATRIUM  Index       RIGHT ATRIUM           Index LA diam:        3.20 cm 1.42 cm/m  RA Area:     13.70 cm LA Vol (A2C):   36.7 ml 16.28 ml/m RA Volume:   34.00 ml  15.08 ml/m LA Vol (A4C):   64.3 ml 28.52 ml/m LA Biplane Vol: 54.1 ml 24.00 ml/m  AORTIC VALVE LVOT Vmax:   79.20 cm/s LVOT Vmean:  52.500 cm/s LVOT VTI:    0.142 m  AORTA Ao Root diam: 2.70 cm MITRAL VALVE                TRICUSPID VALVE MV Area (PHT): 7.44 cm     TR Peak grad:   41.7 mmHg MV Decel Time: 102 msec     TR Vmax:        323.00 cm/s MV E velocity: 1.05 cm/s MV A velocity: 112.00 cm/s  SHUNTS MV E/A ratio:  0.01         Systemic VTI:  0.14 m                             Systemic Diam: 2.10 cm Loralie Champagne MD Electronically signed by Loralie Champagne MD Signature Date/Time: 08/07/2020/2:59:45 PM    Final     carvedilol  25 mg Per Tube BID   chlorhexidine gluconate (MEDLINE KIT)  15 mL Mouth Rinse BID    Chlorhexidine Gluconate Cloth  6 each Topical Daily   Chlorhexidine Gluconate Cloth  6 each Topical Q0600   docusate  100 mg Per Tube BID   famotidine  10 mg Per Tube Daily   feeding supplement (PROSource TF)  90 mL Per Tube TID   fentaNYL (SUBLIMAZE) injection  50 mcg Intravenous Once   heparin  5,000 Units Subcutaneous Q8H   insulin aspart  0-15 Units Subcutaneous Q4H   mouth rinse  15 mL Mouth Rinse 10 times per day   multivitamin  1 tablet Per Tube Daily   rosuvastatin  20 mg Per Tube Daily   ticagrelor  90 mg Per Tube BID    BMET    Component Value Date/Time   NA 133 (L) 08/08/2020 0142   NA 139 02/28/2020 1047   K 4.9 08/08/2020 0142   CL 93 (L) 08/08/2020 0142   CO2 26 08/08/2020 0142   GLUCOSE 155 (H) 08/08/2020 0142   BUN 54 (H) 08/08/2020 0142   BUN 29 (H) 02/28/2020 1047   CREATININE 10.06 (H) 08/08/2020 0142   CREATININE 2.28 (H) 03/21/2012 0952   CALCIUM 8.7 (L) 08/08/2020 0142   GFRNONAA 4 (L) 08/08/2020 0142   GFRAA 6 (L) 02/28/2020 1047   CBC    Component Value Date/Time   WBC 14.4 (H) 08/08/2020 0142   RBC 3.25 (L) 08/08/2020 0142   HGB 10.2 (L) 08/08/2020 0142   HGB 8.1 (L) 02/28/2020 1047   HCT 31.4 (L) 08/08/2020 0142   HCT 25.0 (L) 02/28/2020 1047   PLT 249 08/08/2020 0142   PLT 218 02/28/2020 1047   MCV 96.6 08/08/2020 0142   MCV 98 (H) 02/28/2020 1047   MCH 31.4 08/08/2020 0142   MCHC 32.5 08/08/2020 0142   RDW 16.5 (H) 08/08/2020 0142   RDW 15.0 02/28/2020 1047   LYMPHSABS 1.4 07/31/2020 1254   LYMPHSABS 1.1 02/28/2020 1047   MONOABS 0.3 08/14/2020 1254   EOSABS 0.1  08/05/2020 1254   EOSABS 0.0 02/28/2020 1047   BASOSABS 0.0 08/09/2020 1254   BASOSABS 0.0 02/28/2020 1047    Dialysis Orders: Center: [facility]  on 4 treatments/week . EDW 124kg  HD Bath 2K/2.5Ca  Time 3:30 Heparin 5000 units bolus. Access LUE AVF BFR 400 DFR 600        Assessment/Plan:  PEA arrest - s/p ACLS with ROSC in 10 minutes.  PCCM admitting and will  check cardiac enzymes, ECHO with EF of 45-50% and Grade I DD.  BP stable Acute hypoxic respiratory failure s/p PEA arrest s/p intubation in the field - possible aspiration.  Per PCCM awaiting pt to follow commands before attempting extubation.  Currently on fentanyl for sedation due to agitation.  ESRD -  on HD today and follow her mentation afterwards.  Hypertension/volume  - stable at this time  Anemia  - Hgb dropping.  Will start ESA and follow  Metabolic bone disease -  npo for now  Nutrition - npo for now. H/o CAD s/p NSTEMI PCI and DES - cont brillinta and coreg for now.  Off imdur and cardizem given bradycardia DM type II - per primary. Acute metabolic encephalopathy - EEG without seizures but suggestive of profound, diffuse encephalopathy.  Currently on sedation.  MRI without evidence of anoxic brain injury.    Donetta Potts, MD [employer] [phone]

## 2020-08-08 NOTE — Progress Notes (Addendum)
NAME:  Brenda Davis, MRN:  [id number], DOB:  September 22, 1965, LOS: 2 ADMISSION DATE:  08/01/2020, CONSULTATION DATE:  08/08/2020 REFERRING MD:  Dr. Johnney Killian CHIEF COMPLAINT:  PEA arrest   History of Present Illness:  Brenda Davis is a 55 y.o. female with a PMH significant for ESRD on dialysis at home 4 days a week , CAD s/p PCI with DES, HTN, HLD, type 2 diabetes, morbid obesity, and anemia who was at an outpatient endoscopy procedure where she suffered a cardiac arrest. Per family she was undergoing a planned colonoscopy and endoscopy for a history of persistent nausea and vomiting. Per report patient had tolerated procedure well until she suddenly suffered a PEA cardiac arrest. Arrest time was estimated at 10 mins, she received 2 round of epi and compressions. Patient was intubated during arrest. Spouse denies any other acute complaints prior to admission.  Pertinent  Medical History  ESRD on HD 4 days/week, CAD s/p PCI with DES, HTN, HLD, T2DM, morbid obesity, anemia  Significant Hospital Events: Including procedures, antibiotic start and stop dates in addition to other pertinent events   7/19: admitted after 10 min PEA arrest during outpatient endoscopy/colonoscopy 7/20: some involuntary movements noted, EEG and MRI obtained  Interim History / Subjective:  No acute events overnight. Patient appears to be having more purposeful movements this morning, but does not follow commands or interact meaningfully.   Objective   Blood pressure (!) 107/52, pulse 68, temperature 98.4 F (36.9 C), temperature source Axillary, resp. rate 15, height 5' 5"  (1.651 m), weight 126.7 kg, SpO2 100 %.    Vent Mode: PRVC FiO2 (%):  [40 %] 40 % Set Rate:  [16 bmp] 16 bmp Vt Set:  [450 mL] 450 mL PEEP:  [5 cmH20] 5 cmH20 Pressure Support:  [8 cmH20] 8 cmH20 Plateau Pressure:  [18 cmH20-21 cmH20] 18 cmH20   Intake/Output Summary (Last 24 hours) at 08/08/2020 0836 Last data filed at  08/08/2020 0700 Gross per 24 hour  Intake 1757.3 ml  Output --  Net 1757.3 ml   Filed Weights   08/07/20 0500  Weight: 123.4 kg    Examination: General: obese, NAD HENT: ETT in place, PERRL Lungs: CTA although exam somewhat limited by body habitus Cardiovascular: RRR, normal S1/S2 without m/r/g Abdomen: +BS, soft, nontender Extremities: warm and dry, no significant lower extremity edema Neuro: opens eyes, moves all extremities equally, does not follow commands  Resolved Hospital Problem list   Lactic acidosis  Assessment & Plan:  s/p 10 minute PEA arrest Acute Hypoxic Respiratory Failure Aspiration PNA MRI brain and EEG without acute abnormalities P: -Wean sedation -Normothermic protocol -Continue Unasyn x7 days total  Hypotension Improving somewhat this morning P: -Wean Levophed as tolerated -Holding home carvedilol  Apical Hypokinesis of LV Reduced EF H/o CAD s/p PCI and DES Echo with EF 45-50% from 65-70% two years ago. Also shows new apical hypokinesis. P: -f/u troponin  -Consult cardiology -Continue Brilinta  -? needs ASA as well? -Holding home Carvedilol, Imdur, and Cardizem  ESRD on HD Receiving dialysis currently P: -HD per nephro  T2DM CBGs 120s-160s P: -SSI -CBG monitoring  Best Practice (right click and "Reselect all SmartList Selections" daily)   Diet/type: tubefeeds DVT prophylaxis: prophylactic heparin  GI prophylaxis: PPI Lines: N/A Foley:  N/A Code Status:  full code Last date of multidisciplinary goals of care discussion: 08/08/20, husband updated  Critical care time:   Alcus Dad, MD PGY-2 [facility]  ------------------------------------------------------------  Attending note: I  have seen and examined the patient. History, labs and imaging reviewed.  55 year old with end-stage renal disease, coronary artery disease, hypertension, hyperlipidemia, diabetes suffered cardiac arrest during EGD colonoscopy.   Time to ROSC 10 minutes EEG and MRi yesterday are unremarkable. On fentanyl, HD today Started peripheral levo for low BP  Blood pressure (!) 145/77, pulse 72, temperature 97.9 F (36.6 C), temperature source Oral, resp. rate 13, height 5' 5"  (1.651 m), weight 123.4 kg, SpO2 100 %. Gen:      No acute distress HEENT:  EOMI, sclera anicteric Neck:     No masses; no thyromegaly, ETT Lungs:    Clear to auscultation bilaterally; normal respiratory effort CV:         Regular rate and rhythm; no murmurs Abd:      + bowel sounds; soft, non-tender; no palpable masses, no distension Ext:    No edema; adequate peripheral perfusion Skin:      Warm and dry; no rash Neuro: Sedated  Labs/Imaging personally reviewed, significant for Na 133, Cr 10.06 WBC elevated to 14.4  Assessment/plan: PEA cardiac arrest during endoscopy 10 minutes to ROSC Continue monitoring Wean sedation today  Acute respiratory failure in the setting of cardiac arrest Aspiration pneumonia Low tidal volume ventilation PSV weans  Continue Unasyn   End-stage renal disease For dialysis today  History of coronary artery disease, hypertension, hyperlipidemia Echo noted with new reduction in EF and wall motion abnormalities Consult cardiology Continue Brilinta Holding Coreg, Imdur and Cardizem due to hypotension  The patient is critically ill with multiple organ systems failure and requires high complexity decision making for assessment and support, frequent evaluation and titration of therapies, application of advanced monitoring technologies and extensive interpretation of multiple databases.  Critical care time - 35 mins. This represents my time independent of the NPs time taking care of the pt.  Marshell Garfinkel MD [facility] 08/08/2020, 8:55 AM

## 2020-08-08 NOTE — Progress Notes (Signed)
Date and time results received: 08/08/20 1200  (use smartphrase ".now" to insert current time)  Test: troponin Critical Value: 4,144  Name of Provider Notified: Dr. Vaughan Browner  Orders Received? Or Actions Taken?:    Awaiting cardiology consult

## 2020-08-08 NOTE — Progress Notes (Signed)
ANTICOAGULATION CONSULT NOTE - Initial Consult  Pharmacy Consult for Heparin Indication: chest pain/ACS  Allergies  Allergen Reactions   Amlodipine Swelling   Atorvastatin     Elevated LFT's   Clonidine Derivatives Swelling    Limbs swell   Doxycycline Nausea And Vomiting    "I threw up for 3 hours"   Welchol [Colesevelam Hcl] Nausea Only    Patient Measurements: Height: 5' 5"  (165.1 cm) Weight: 126.7 kg (279 lb 5.2 oz) IBW/kg (Calculated) : 57 Heparin Dosing Weight: 87.2 kg   Vital Signs: Temp: 98.8 F (37.1 C) (07/21 1132) Temp Source: Oral (07/21 1132) BP: 108/66 (07/21 1133) Pulse Rate: 82 (07/21 1133)  Labs: Recent Labs    07/19/2020 1254 07/22/2020 1255 07/27/2020 1451 08/07/20 0447 08/08/20 0142 08/08/20 1030  HGB 10.7*   < > 9.6* 11.0* 10.2*  --   HCT 34.2*   < > 30.5* 33.1* 31.4*  --   PLT 243  --  240 261 249  --   LABPROT 15.3*  --   --   --   --   --   INR 1.2  --   --   --   --   --   CREATININE 7.78*   < > 8.09* 8.29* 10.06*  --   TROPONINIHS 18*  --  94*  --   --  4,144*   < > = values in this interval not displayed.    Estimated Creatinine Clearance: 8.5 mL/min (A) (by C-G formula based on SCr of 10.06 mg/dL (H)).   Medical History: Past Medical History:  Diagnosis Date   Anemia    CAD (coronary artery disease)    a. cath in 02/2018 showing moderate 3-vessel CAD with 45% mid-LADm 55% LCx and 65% RCA stenosis which was not significant by FFR   ESRD on dialysis ([place])    Gout    HCAP (healthcare-associated pneumonia) 05/06/2016   History of blood transfusion    "related to surgery"   Hyperlipidemia    Hypertension    Morbid obesity ([place])    Pain    LEFT SHOULDER PAIN - WAS SEEN AT AN URGENT CARE - GIVEN SLING FOR COMFORT AND TOLD ROM AS TOLERATED.   Palpitations 09/24/2016   Peritonitis, dialysis-associated (HCC)    SVT (supraventricular tachycardia) ([place]) 02/28/2020   Type II diabetes mellitus ([place])    "gastric sleeve OR corrected this"  (05/06/2016)    Assessment: 55 yo admitted 07/24/2020 s/p OSH cardiac arrest during colonoscopy and endoscopy. Patient with PMH of CAD s/p PCI and DES in 12/2018 due to NSTEMI and found to have stent restenosis in [place] in 02/2019. Pharmacy consulted to dose heparin for concern of ACS. Troponin increased from 94 on 7/19 PM to 4,144 today. ECHO with EF 45-50% and new apical hypokinesis - cardiology consulted. No anticoagulation prior to admission. Hgb 10.2. Plt wnl. No bleeding noted. Last dose SQ heparin given 7/21 ~0500.  Goal of Therapy:  Heparin level 0.3-0.7 units/ml Monitor platelets by anticoagulation protocol: Yes   Plan:  Heparin 4000 unit bolus x1  Start heparin 1300 units/hr  Check 8hr heparin level Monitor heparin level, CBC and s/s of bleeding daily  Follow up cardiology recommendations.   Cristela Felt, PharmD, BCPS Clinical Pharmacist 08/08/2020 12:08 PM

## 2020-08-09 DIAGNOSIS — I469 Cardiac arrest, cause unspecified: Secondary | ICD-10-CM | POA: Diagnosis not present

## 2020-08-09 LAB — CBC
HCT: 30.8 % — ABNORMAL LOW (ref 36.0–46.0)
HCT: 27.9 % — ABNORMAL LOW (ref 36.0–46.0)
Hemoglobin: 9.7 g/dL — ABNORMAL LOW (ref 12.0–15.0)
Hemoglobin: 8.8 g/dL — ABNORMAL LOW (ref 12.0–15.0)
MCH: 30.5 pg (ref 26.0–34.0)
MCH: 30.4 pg (ref 26.0–34.0)
MCHC: 31.5 g/dL (ref 30.0–36.0)
MCHC: 31.5 g/dL (ref 30.0–36.0)
MCV: 96.9 fL (ref 80.0–100.0)
MCV: 96.5 fL (ref 80.0–100.0)
Platelets: 243 10*3/uL (ref 150–400)
Platelets: 222 10*3/uL (ref 150–400)
RBC: 3.18 MIL/uL — ABNORMAL LOW (ref 3.87–5.11)
RBC: 2.89 MIL/uL — ABNORMAL LOW (ref 3.87–5.11)
RDW: 16.4 % — ABNORMAL HIGH (ref 11.5–15.5)
RDW: 16.4 % — ABNORMAL HIGH (ref 11.5–15.5)
WBC: 12.6 10*3/uL — ABNORMAL HIGH (ref 4.0–10.5)
WBC: 12.2 10*3/uL — ABNORMAL HIGH (ref 4.0–10.5)
nRBC: 0 % (ref 0.0–0.2)
nRBC: 0.2 % (ref 0.0–0.2)

## 2020-08-09 LAB — LIPID PANEL
Cholesterol: 105 mg/dL (ref 0–200)
HDL: 56 mg/dL (ref >40)
LDL Cholesterol: 42 mg/dL (ref 0–99)
Total CHOL/HDL Ratio: 1.9 RATIO
Triglycerides: 35 mg/dL (ref <150)
VLDL: 7 mg/dL (ref 0–40)

## 2020-08-09 LAB — RENAL FUNCTION PANEL
Albumin: 3 g/dL — ABNORMAL LOW (ref 3.5–5.0)
Albumin: 2.7 g/dL — ABNORMAL LOW (ref 3.5–5.0)
Anion gap: 17 — ABNORMAL HIGH (ref 5–15)
Anion gap: 13 (ref 5–15)
BUN: 57 mg/dL — ABNORMAL HIGH (ref 6–20)
BUN: 65 mg/dL — ABNORMAL HIGH (ref 6–20)
CO2: 27 mmol/L (ref 22–32)
CO2: 26 mmol/L (ref 22–32)
Calcium: 8.9 mg/dL (ref 8.9–10.3)
Calcium: 8.5 mg/dL — ABNORMAL LOW (ref 8.9–10.3)
Chloride: 90 mmol/L — ABNORMAL LOW (ref 98–111)
Chloride: 95 mmol/L — ABNORMAL LOW (ref 98–111)
Creatinine, Ser: 8.17 mg/dL — ABNORMAL HIGH (ref 0.44–1.00)
Creatinine, Ser: 8.57 mg/dL — ABNORMAL HIGH (ref 0.44–1.00)
GFR, Estimated: 5 mL/min — ABNORMAL LOW (ref >60)
GFR, Estimated: 5 mL/min — ABNORMAL LOW (ref >60)
Glucose, Bld: 164 mg/dL — ABNORMAL HIGH (ref 70–99)
Glucose, Bld: 180 mg/dL — ABNORMAL HIGH (ref 70–99)
Phosphorus: 5.8 mg/dL — ABNORMAL HIGH (ref 2.5–4.6)
Phosphorus: 6 mg/dL — ABNORMAL HIGH (ref 2.5–4.6)
Potassium: 4.6 mmol/L (ref 3.5–5.1)
Potassium: 4.7 mmol/L (ref 3.5–5.1)
Sodium: 134 mmol/L — ABNORMAL LOW (ref 135–145)
Sodium: 134 mmol/L — ABNORMAL LOW (ref 135–145)

## 2020-08-09 LAB — GLUCOSE, CAPILLARY
Glucose-Capillary: 132 mg/dL — ABNORMAL HIGH (ref 70–99)
Glucose-Capillary: 152 mg/dL — ABNORMAL HIGH (ref 70–99)
Glucose-Capillary: 150 mg/dL — ABNORMAL HIGH (ref 70–99)
Glucose-Capillary: 134 mg/dL — ABNORMAL HIGH (ref 70–99)
Glucose-Capillary: 179 mg/dL — ABNORMAL HIGH (ref 70–99)
Glucose-Capillary: 130 mg/dL — ABNORMAL HIGH (ref 70–99)

## 2020-08-09 LAB — TROPONIN I (HIGH SENSITIVITY): Troponin I (High Sensitivity): 2163 ng/L (ref <18)

## 2020-08-09 LAB — HEPARIN LEVEL (UNFRACTIONATED): Heparin Unfractionated: 0.51 IU/mL (ref 0.30–0.70)

## 2020-08-09 MED ORDER — HEPARIN SODIUM (PORCINE) 1000 UNIT/ML DIALYSIS
20.0000 [IU]/kg | INTRAMUSCULAR | Status: DC | PRN
  Administered 2020-08-12: 2500 [IU] via INTRAVENOUS_CENTRAL
  Filled 2020-08-09: qty 3

## 2020-08-09 MED ORDER — CARVEDILOL 25 MG PO TABS
25.0000 mg | ORAL_TABLET | Freq: Two times a day (BID) | ORAL | Status: DC
  Administered 2020-08-09: 25 mg
  Filled 2020-08-09: qty 1

## 2020-08-09 MED ORDER — CARVEDILOL 12.5 MG PO TABS: 12.5000 mg | ORAL_TABLET | Freq: Two times a day (BID) | ORAL | Status: DC

## 2020-08-09 NOTE — Progress Notes (Signed)
ANTICOAGULATION CONSULT NOTE - Follow-Up Consult  Pharmacy Consult for Heparin Indication: chest pain/ACS  Patient Measurements: Height: 5' 5"  (165.1 cm) Weight: 124.6 kg (274 lb 11.1 oz) IBW/kg (Calculated) : 57 Heparin Dosing Weight: 87.2 kg   Vital Signs: Temp: 98.9 F (37.2 C) (07/22 0732) Temp Source: Oral (07/22 0732) BP: 102/65 (07/22 0737) Pulse Rate: 71 (07/22 0737)  Labs: Recent Labs    07/19/2020 1254 08/10/2020 1255 08/12/2020 1451 08/07/20 0447 08/08/20 0142 08/08/20 1030 08/08/20 2109 08/09/20 0708  HGB 10.7*   < > 9.6* 11.0* 10.2*  --   --  9.7*  HCT 34.2*   < > 30.5* 33.1* 31.4*  --   --  30.8*  PLT 243  --  240 261 249  --   --  243  LABPROT 15.3*  --   --   --   --   --   --   --   INR 1.2  --   --   --   --   --   --   --   HEPARINUNFRC  --   --   --   --   --   --  0.45 0.51  CREATININE 7.78*   < > 8.09* 8.29* 10.06*  --   --  8.17*  TROPONINIHS 18*  --  94*  --   --  4,144*  --  2,163*   < > = values in this interval not displayed.     Estimated Creatinine Clearance: 10.3 mL/min (A) (by C-G formula based on SCr of 8.17 mg/dL (H)).  Assessment: 55 yo admitted 08/14/2020 s/p OSH cardiac arrest during colonoscopy and endoscopy. Patient with PMH of CAD s/p PCI and DES in 12/2018 due to NSTEMI and found to have stent restenosis in [place] in 02/2019. Pharmacy consulted to dose heparin for concern of ACS. Troponin increased from 94 on 7/19 PM to 4,144 on 7/21, downtrending to 2,163 today. ECHO with EF 45-50% and new apical hypokinesis - cardiology consulted. No anticoagulation prior to admission. Hgb 9.7. Plt wnl. No bleeding noted. Last dose SQ heparin given 7/21 ~0500.  Heparin level this AM (via foot stick) is therapeutic (HL 0.51, goal of 0.3-0.7). No bleeding or issues noted per RN.   Goal of Therapy:  Heparin level 0.3-0.7 units/ml Monitor platelets by anticoagulation protocol: Yes   Plan:  - Continue Heparin at 1300 units/hr - Will continue to monitor  for any signs/symptoms of bleeding - Monitor daily HL  Thank you for allowing pharmacy to be a part of this patient's care.  Joetta Manners, PharmD, [facility] Clinical Pharmacist ED RPh Phone: [place]: [phone]

## 2020-08-09 NOTE — Progress Notes (Signed)
NAME:  TARON CONREY, MRN:  [id number], DOB:  Jun 09, 1965, LOS: 3 ADMISSION DATE:  08/17/2020, CONSULTATION DATE:  08/08/2020 REFERRING MD:  Dr. Johnney Killian CHIEF COMPLAINT:  PEA arrest   History of Present Illness:  Brenda Davis is a 55 y.o. female with a PMH significant for ESRD on dialysis at home 4 days a week , CAD s/p PCI with DES, HTN, HLD, type 2 diabetes, morbid obesity, and anemia who was at an outpatient endoscopy procedure where she suffered a cardiac arrest. Per family she was undergoing a planned colonoscopy and endoscopy for a history of persistent nausea and vomiting. Per report patient had tolerated procedure well until she suddenly suffered a PEA cardiac arrest. Arrest time was estimated at 10 mins, she received 2 round of epi and compressions. Patient was intubated during arrest. Spouse denies any other acute complaints prior to admission.  Pertinent  Medical History  ESRD on HD 4 days/week, CAD s/p PCI with DES, HTN, HLD, T2DM, morbid obesity, anemia  Significant Hospital Events: Including procedures, antibiotic start and stop dates in addition to other pertinent events   7/19: admitted after 10 min PEA arrest during outpatient endoscopy/colonoscopy 7/20: some involuntary movements noted, EEG and MRI obtained 7/21: cardiology consulted, started IV Heparin  Antibiotics: IV Unasyn 7/19-   Interim History / Subjective:  No acute events overnight. Patient unable to provide any history this morning.  Objective   Blood pressure (!) 143/75, pulse 72, temperature 98.9 F (37.2 C), temperature source Oral, resp. rate 17, height 5' 5"  (1.651 m), weight 124.6 kg, SpO2 100 %.    Vent Mode: PRVC FiO2 (%):  [40 %] 40 % Set Rate:  [16 bmp] 16 bmp Vt Set:  [450 mL] 450 mL PEEP:  [5 cmH20] 5 cmH20 Pressure Support:  [8 DTO67-12 cmH20] 8 cmH20 Plateau Pressure:  [17 cmH20-20 cmH20] 20 cmH20   Intake/Output Summary (Last 24 hours) at 08/09/2020 0759 Last data  filed at 08/09/2020 0600 Gross per 24 hour  Intake 1819.08 ml  Output 3000 ml  Net -1180.92 ml   Filed Weights   08/08/20 0817 08/08/20 1117 08/09/20 0500  Weight: 126.7 kg 123.7 kg 124.6 kg    Examination: General: obese, NAD HENT: ETT in place, PERRL Lungs: CTA although exam somewhat limited by body habitus Cardiovascular: RRR, normal S1/S2 without m/r/g Abdomen: +BS, soft, nontender Extremities: warm and dry, no significant lower extremity edema Neuro: opens eyes, able to move all extremities but does not follow commands  Resolved Hospital Problem list   Lactic acidosis  Assessment & Plan:  s/p 10 minute PEA arrest MRI brain and EEG without acute abnormalities. Patient not following commands, but is able to move all extremities. Off all sedation. P: -Monitor mental status  Acute Hypoxic Respiratory Failure Aspiration PNA P: -Continue Unasyn (day #4/7) -Continue vent management, low VT  Elevated Troponin Echo changes H/o CAD s/p PCI and DES Echo with reduction in EF from prior and regional wall motion abnormalities. Trop trending flat (4580>9983 this morning). Possibly due to cardiac arrest vs underlying obstructive disease. P: -Cards following, appreciate recs -Continue IV Heparin x48 hrs -Continue Brilinta, ASA, statin -Restart home Coreg -Plan for repeat echo vs cath if adequate neuro recovery  HTN Briefly required levophed for hypotension, which has now resolved. P: -Restart home Coreg  ESRD on HD Last HD 08/08/20 P: -HD per nephro  T2DM CBGs 130s-170s P: -SSI -CBG monitoring  Best Practice (right click and "Reselect all SmartList Selections" daily)  Diet/type: tubefeeds DVT prophylaxis: systemic heparin GI prophylaxis: PPI Lines: N/A Foley:  N/A Code Status:  full code Last date of multidisciplinary goals of care discussion: 08/08/20, husband updated    Alcus Dad, MD PGY-2 [facility]

## 2020-08-09 NOTE — Progress Notes (Signed)
Patient wiggled left toes on command x3.

## 2020-08-09 NOTE — Progress Notes (Addendum)
Patient ID: Brenda Davis, female   DOB: [birth year], 55 y.o.   MRN: [id number] S: Required sedation again last night.  Off of levophed. O:BP (!) 165/88   Pulse 81   Temp 98.9 F (37.2 C) (Oral)   Resp 16   Ht _0  (1.651 m)   Wt 124.6 kg   SpO2 100%   BMI 45.71 kg/m   Intake/Output Summary (Last 24 hours) at 08/09/2020 0936 Last data filed at 08/09/2020 0600 Gross per 24 hour  Intake 1688.77 ml  Output 3000 ml  Net -1311.23 ml   Intake/Output: I/O last 3 completed shifts: In: 2683 [I.V.:598; ZJ/QB:3419; IV Piggyback:100] Out: 3000 [Other:3000]  Intake/Output this shift:  No intake/output data recorded. Weight change:  FXT:[id number], eyes open, unresponsive to voice or tactile stimuli CVS:RRR Resp: ventilated BS bilaterally Abd: +BS, soft, NT/ND Ext: no edema, LUE AVF +T/B  Recent Labs  Lab 08/10/2020 1254 07/31/2020 1255 07/26/2020 1427 07/25/2020 1451 08/12/2020 1706 08/07/20 0447 08/07/20 1647 08/08/20 0142 08/09/20 0708  NA 133* 131* 132*  --   --  133*  --  133* 134*  K 5.2* 5.1 5.1  --   --  4.9  --  4.9 4.6  CL 90* 94*  --   --   --  92*  --  93* 90*  CO2 29  --   --   --   --  25  --  26 27  GLUCOSE 168* 144*  --   --   --  130*  --  155* 164*  BUN 28* 32*  --   --   --  38*  --  54* 57*  CREATININE 7.78* 7.60*  --  8.09*  --  8.29*  --  10.06* 8.17*  ALBUMIN 3.5  --   --   --   --   --   --  2.8* 3.0*  CALCIUM 9.4  --   --   --   --  9.6  --  8.7* 8.9  PHOS 6.4*  --   --  5.7* 5.6* 5.1* 6.4*  --  5.8*  AST 83*  --   --   --   --   --   --  36  --   ALT 78*  --   --   --   --   --   --  45*  --    Liver Function Tests: Recent Labs  Lab 08/09/2020 1254 08/08/20 0142 08/09/20 0708  AST 83* 36  --   ALT 78* 45*  --   ALKPHOS 72 66  --   BILITOT 0.7 0.7  --   PROT 7.1 6.2*  --   ALBUMIN 3.5 2.8* 3.0*   No results for input(s): LIPASE, AMYLASE in the last 168 hours. No results for input(s): AMMONIA in the last 168 hours. CBC: Recent Labs  Lab  08/02/2020 1254 08/04/2020 1255 07/24/2020 1451 08/07/20 0447 08/08/20 0142 08/09/20 0708  WBC 6.1  --  7.9 12.8* 14.4* 12.6*  NEUTROABS 4.2  --   --   --   --   --   HGB 10.7*   < > 9.6* 11.0* 10.2* 9.7*  HCT 34.2*   < > 30.5* 33.1* 31.4* 30.8*  MCV 98.6  --  97.8 93.5 96.6 96.9  PLT 243  --  240 261 249 243   < > = values in this interval not displayed.   Cardiac Enzymes: No results for input(s): CKTOTAL,  CKMB, CKMBINDEX, TROPONINI in the last 168 hours. CBG: Recent Labs  Lab 08/08/20 1506 08/08/20 1932 08/08/20 2325 08/09/20 0317 08/09/20 0730  GLUCAP 171* 134* 167* 132* 152*    Iron Studies: No results for input(s): IRON, TIBC, TRANSFERRIN, FERRITIN in the last 72 hours. Studies/Results: MR BRAIN W WO CONTRAST  Result Date: 08/07/2020 CLINICAL DATA:  Mental status change. Concern for anoxic brain injury. EXAM: MRI HEAD WITHOUT AND WITH CONTRAST TECHNIQUE: Multiplanar, multiecho pulse sequences of the brain and surrounding structures were obtained without and with intravenous contrast. CONTRAST:  84m GADAVIST GADOBUTROL 1 MMOL/ML IV SOLN COMPARISON:  CT head August 06, 2020.  MRI April 21, 2018. FINDINGS: Brain: No acute infarction, hemorrhage, hydrocephalus, extra-axial collection or mass lesion. Mild scattered small T2/FLAIR hyperintense lesions within the white matter, nonspecific but most likely related to mild chronic microvascular ischemic disease. No abnormal enhancement. Vascular: Major arterial flow voids are maintained skull base. Skull and upper cervical spine: Diffuse T1 hypointensity of the marrow without discrete marrow replacing lesion. Sinuses/Orbits: Mild sinus mucosal thickening.  Unremarkable orbits. Other: Small right greater than left mastoid effusions. IMPRESSION: 1. No evidence of acute intracranial abnormality. 2. Mild chronic microvascular ischemic disease. 3. Diffuse T1 hypointensity of the marrow, which is nonspecific but can be seen with chronic anemia, chronic  hypoxia (such as in smokers), and lymphoproliferative disorders. Electronically Signed   By: FMargaretha SheffieldMD   On: 08/07/2020 15:49   EEG adult  Result Date: 08/07/2020 YLora Havens MD     08/07/2020  2:34 PM Patient Name: YCHANTAL WORTHEYMRN: [id number] Attending: PLora HavensReferring Physician/Provider: WGerald Leitz NP Date: 08/07/2020 Duration: 23.46 mins Patient history: 89yoF s/p cardiac arrest. EEG to evaluate for seizure.  Level of alertness:  comatose  AEDs during EEG study: GBP  Technical aspects: This EEG study was done with scalp electrodes positioned according to the 10-20 International system of electrode placement. Electrical activity was acquired at a sampling rate of _0  and reviewed with a high frequency filter of _1  and a low frequency filter of _2 . EEG data were recorded continuously and digitally stored.  Description: EEG showed continuous generalized amplitude sharply contoured 3 to 5 Hz theta-delta slowing, at times with triphasic morphology.  Hyperventilation and photic stimulation were not performed.     ABNORMALITY -Continuous slow, generalized  IMPRESSION: This study is suggestive of severe diffuse encephalopathy, nonspecific etiology. No seizures or epileptiform discharges were seen throughout the recording. EEG appears to be improving compared to previous day.   Priyanka OBarbra Sarks   aspirin  81 mg Per Tube Daily   carvedilol  25 mg Per Tube BID   chlorhexidine gluconate (MEDLINE KIT)  15 mL Mouth Rinse BID   Chlorhexidine Gluconate Cloth  6 each Topical Daily   Chlorhexidine Gluconate Cloth  6 each Topical Q0600   docusate  100 mg Per Tube BID   famotidine  10 mg Per Tube Daily   feeding supplement (PROSource TF)  90 mL Per Tube TID   fentaNYL (SUBLIMAZE) injection  50 mcg Intravenous Once   insulin aspart  0-15 Units Subcutaneous Q4H   mouth rinse  15 mL Mouth Rinse 10 times per day   multivitamin  1 tablet Per Tube Daily    rosuvastatin  20 mg Per Tube Daily   ticagrelor  90 mg Per Tube BID    BMET    Component Value Date/Time   NA 134 (L) 08/09/2020 00355  NA 139 02/28/2020 1047   K 4.6 08/09/2020 0708   CL 90 (L) 08/09/2020 0708   CO2 27 08/09/2020 0708   GLUCOSE 164 (H) 08/09/2020 0708   BUN 57 (H) 08/09/2020 0708   BUN 29 (H) 02/28/2020 1047   CREATININE 8.17 (H) 08/09/2020 0708   CREATININE 2.28 (H) 03/21/2012 0952   CALCIUM 8.9 08/09/2020 0708   GFRNONAA 5 (L) 08/09/2020 0708   GFRAA 6 (L) 02/28/2020 1047   CBC    Component Value Date/Time   WBC 12.6 (H) 08/09/2020 0708   RBC 3.18 (L) 08/09/2020 0708   HGB 9.7 (L) 08/09/2020 0708   HGB 8.1 (L) 02/28/2020 1047   HCT 30.8 (L) 08/09/2020 0708   HCT 25.0 (L) 02/28/2020 1047   PLT 243 08/09/2020 0708   PLT 218 02/28/2020 1047   MCV 96.9 08/09/2020 0708   MCV 98 (H) 02/28/2020 1047   MCH 30.5 08/09/2020 0708   MCHC 31.5 08/09/2020 0708   RDW 16.4 (H) 08/09/2020 0708   RDW 15.0 02/28/2020 1047   LYMPHSABS 1.4 07/19/2020 1254   LYMPHSABS 1.1 02/28/2020 1047   MONOABS 0.3 07/30/2020 1254   EOSABS 0.1 08/18/2020 1254   EOSABS 0.0 02/28/2020 1047   BASOSABS 0.0 08/09/2020 1254   BASOSABS 0.0 02/28/2020 1047     Dialysis Orders: Center: [facility]  on 4 treatments/week . EDW 124kg  HD Bath 2K/2.5Ca  Time 3:30 Heparin 5000 units bolus. Access LUE AVF BFR 400 DFR 600        Assessment/Plan:  PEA arrest - s/p ACLS with ROSC in 10 minutes.  PCCM admitting and will check cardiac enzymes, ECHO with EF of 45-50% and Grade I DD.  required levophed yesterday but weaned off today. Acute hypoxic respiratory failure s/p PEA arrest s/p intubation in the field - possible aspiration.  Per PCCM awaiting pt to follow commands before attempting extubation.  Currently on fentanyl for sedation due to agitation.  ESRD -  s/p HD 08/08/20.  Will plan for HD again today due to staffing issues.  Continue to follow her mentation afterwards.   Hypertension/volume  - stable at this time  Anemia  - Hgb dropping.  Will start ESA and follow  Metabolic bone disease -  npo for now  Nutrition - npo for now. H/o CAD s/p NSTEMI PCI and DES - cont brillinta and coreg for now.  Off imdur and cardizem given bradycardia DM type II - per primary. Acute metabolic encephalopathy - EEG without seizures but suggestive of profound, diffuse encephalopathy.  Currently on sedation.  MRI without evidence of anoxic brain injury.  Donetta Potts, MD [employer] [phone]

## 2020-08-09 NOTE — Progress Notes (Signed)
Progress Note  Patient Name: Brenda Davis Date of Encounter: 08/09/2020  Primary Cardiologist: Elouise Munroe, MD   Subjective   Remains intubated not sedated, but with no purposeful activity.  Today she does open her eyes to voice but does not track or follow commands.  No family at bedside.  Inpatient Medications    Scheduled Meds:  aspirin  81 mg Per Tube Daily   carvedilol  25 mg Per Tube BID   chlorhexidine gluconate (MEDLINE KIT)  15 mL Mouth Rinse BID   Chlorhexidine Gluconate Cloth  6 each Topical Daily   Chlorhexidine Gluconate Cloth  6 each Topical Q0600   docusate  100 mg Per Tube BID   famotidine  10 mg Per Tube Daily   feeding supplement (PROSource TF)  90 mL Per Tube TID   fentaNYL (SUBLIMAZE) injection  50 mcg Intravenous Once   insulin aspart  0-15 Units Subcutaneous Q4H   mouth rinse  15 mL Mouth Rinse 10 times per day   multivitamin  1 tablet Per Tube Daily   rosuvastatin  20 mg Per Tube Daily   ticagrelor  90 mg Per Tube BID   Continuous Infusions:  sodium chloride Stopped (08/07/20 1647)   sodium chloride 250 mL (08/07/20 1750)   ampicillin-sulbactam (UNASYN) IV Stopped (08/08/20 1536)   feeding supplement (VITAL 1.5 CAL) 1,000 mL (08/08/20 1744)   fentaNYL infusion INTRAVENOUS Stopped (08/08/20 1134)   heparin 1,300 Units/hr (08/09/20 0600)   norepinephrine (LEVOPHED) Adult infusion Stopped (08/08/20 1147)   PRN Meds: sodium chloride, docusate, fentaNYL, fentaNYL (SUBLIMAZE) injection, hydrALAZINE, midazolam, midazolam, polyethylene glycol   Vital Signs    Vitals:   08/09/20 0732 08/09/20 0737 08/09/20 0800 08/09/20 0900  BP:  102/65 (!) 154/102 (!) 165/88  Pulse:  71 76 81  Resp:  _0 Temp: 98.9 F (37.2 C)     TempSrc: Oral     SpO2:  100% 100% 100%  Weight:      Height:        Intake/Output Summary (Last 24 hours) at 08/09/2020 1013 Last data filed at 08/09/2020 0600 Gross per 24 hour  Intake 1398.77 ml  Output  3000 ml  Net -1601.23 ml   Filed Weights   08/08/20 0817 08/08/20 1117 08/09/20 0500  Weight: 126.7 kg 123.7 kg 124.6 kg    Telemetry    Sinus rhythm.  There is artifact, no definite PVCs or NSVT- Personally Reviewed  ECG    No new- Personally Reviewed  Physical Exam   GEN: Intubated Neck: No JVD Cardiac: regular rhythm, normal rate, no murmurs, rubs, or gallops.  Respiratory: Clear to auscultation bilaterally. GI: Soft, nontender, non-distended  MS: No edema; No deformity. Neuro: No purposeful activity Psych: Unable to assess due to neurologic status.  Labs    Chemistry Recent Labs  Lab 08/05/2020 1254 08/03/2020 1255 08/07/20 0447 08/08/20 0142 08/09/20 0708  NA 133*   < > 133* 133* 134*  K 5.2*   < > 4.9 4.9 4.6  CL 90*   < > 92* 93* 90*  CO2 29  --  _1 GLUCOSE 168*   < > 130* 155* 164*  BUN 28*   < > 38* 54* 57*  CREATININE 7.78*   < > 8.29* 10.06* 8.17*  CALCIUM 9.4  --  9.6 8.7* 8.9  PROT 7.1  --   --  6.2*  --   ALBUMIN 3.5  --   --  2.8* 3.0*  AST 83*  --   --  36  --   ALT 78*  --   --  45*  --   ALKPHOS 72  --   --  66  --   BILITOT 0.7  --   --  0.7  --   GFRNONAA 6*   < > 5* 4* 5*  ANIONGAP 14  --  16* 14 17*   < > = values in this interval not displayed.     Hematology Recent Labs  Lab 08/07/20 0447 08/08/20 0142 08/09/20 0708  WBC 12.8* 14.4* 12.6*  RBC 3.54* 3.25* 3.18*  HGB 11.0* 10.2* 9.7*  HCT 33.1* 31.4* 30.8*  MCV 93.5 96.6 96.9  MCH 31.1 31.4 30.5  MCHC 33.2 32.5 31.5  RDW 16.2* 16.5* 16.4*  PLT 261 249 243    Cardiac EnzymesNo results for input(s): TROPONINI in the last 168 hours. No results for input(s): TROPIPOC in the last 168 hours.   BNPNo results for input(s): BNP, PROBNP in the last 168 hours.   DDimer No results for input(s): DDIMER in the last 168 hours.   Radiology    MR BRAIN W WO CONTRAST  Result Date: 08/07/2020 CLINICAL DATA:  Mental status change. Concern for anoxic brain injury. EXAM: MRI HEAD  WITHOUT AND WITH CONTRAST TECHNIQUE: Multiplanar, multiecho pulse sequences of the brain and surrounding structures were obtained without and with intravenous contrast. CONTRAST:  50m GADAVIST GADOBUTROL 1 MMOL/ML IV SOLN COMPARISON:  CT head August 06, 2020.  MRI April 21, 2018. FINDINGS: Brain: No acute infarction, hemorrhage, hydrocephalus, extra-axial collection or mass lesion. Mild scattered small T2/FLAIR hyperintense lesions within the white matter, nonspecific but most likely related to mild chronic microvascular ischemic disease. No abnormal enhancement. Vascular: Major arterial flow voids are maintained skull base. Skull and upper cervical spine: Diffuse T1 hypointensity of the marrow without discrete marrow replacing lesion. Sinuses/Orbits: Mild sinus mucosal thickening.  Unremarkable orbits. Other: Small right greater than left mastoid effusions. IMPRESSION: 1. No evidence of acute intracranial abnormality. 2. Mild chronic microvascular ischemic disease. 3. Diffuse T1 hypointensity of the marrow, which is nonspecific but can be seen with chronic anemia, chronic hypoxia (such as in smokers), and lymphoproliferative disorders. Electronically Signed   By: FMargaretha SheffieldMD   On: 08/07/2020 15:49   EEG adult  Result Date: 08/07/2020 YLora Havens MD     08/07/2020  2:34 PM Patient Name: Brenda Davis: [id number] Attending: PLora HavensReferring Physician/Provider: WGerald Leitz NP Date: 08/07/2020 Duration: 23.46 mins Patient history: 89yoF s/p cardiac arrest. EEG to evaluate for seizure.  Level of alertness:  comatose  AEDs during EEG study: GBP  Technical aspects: This EEG study was done with scalp electrodes positioned according to the 10-20 International system of electrode placement. Electrical activity was acquired at a sampling rate of _0  and reviewed with a high frequency filter of _1  and a low frequency filter of _2 . EEG data were recorded continuously  and digitally stored.  Description: EEG showed continuous generalized amplitude sharply contoured 3 to 5 Hz theta-delta slowing, at times with triphasic morphology.  Hyperventilation and photic stimulation were not performed.     ABNORMALITY -Continuous slow, generalized  IMPRESSION: This study is suggestive of severe diffuse encephalopathy, nonspecific etiology. No seizures or epileptiform discharges were seen throughout the recording. EEG appears to be improving compared to previous day.   PLora Havens   Cardiac Studies  Echo:  1. Left ventricular ejection fraction, by estimation, is 45 to 50%. The  left ventricle has mildly decreased function. The left ventricle  demonstrates regional wall motion abnormalities with apical hypokinesis.  There is mild left ventricular hypertrophy.   Left ventricular diastolic parameters are consistent with Grade I  diastolic dysfunction (impaired relaxation).   2. Right ventricular systolic function is mildly reduced. The right  ventricular size is mildly enlarged. There is mildly elevated pulmonary  artery systolic pressure. The estimated right ventricular systolic  pressure is 38.8 mmHg.   3. The mitral valve is normal in structure. Trivial mitral valve  regurgitation. No evidence of mitral stenosis.   4. The aortic valve is tricuspid. Aortic valve regurgitation is not  visualized. Mild aortic valve sclerosis is present, with no evidence of  aortic valve stenosis.   5. The inferior vena cava is dilated in size with >50% respiratory  variability, suggesting right atrial pressure of 8 mmHg.   Patient Profile     55 y.o. female a PMH of CAD s/p PCI/DES to RCA and OM1 12/2018 with subsequent ISR of OM1 which was medically managed, PAD s/p balloon angioplasty to cephalic arch 08/2798, HTN, HLD, DM type 2, ESRD on HD, OSA not on CPAP, and morbid obesity, who is being seen 08/08/2020 for the evaluation of abnormal echocardiogram following cardiac  arrest.  Assessment & Plan   Active Problems:   Cardiac arrest [facility])   She has made minimal improvement in neurologic function since initial consult yesterday.  I have discussed with Dr. Kimber Relic and the patient's nurse our plan of care.  Would continue heparin for at least 48 hours if no contraindication or bleeding.  We will continue to follow for improvement in neurologic function and if she makes a meaningful recovery, will consider cardiac catheterization this admission.  Her regional wall motion abnormalities are in the LAD distribution and she does have residual disease in that vessel.  However this could also represent stress cardiomyopathy given apical wall motion abnormalities.  Could consider limited echo next week to reevaluate regional wall motion abnormalities, if improved may suggest more stress cardiomyopathy than ACS.  Patient is hemodynamically stable today off of pressors for approximately 24 hours, we could consider reinitiating her carvedilol, perhaps at a lower dose tomorrow.  Resume Crestor when able.      For questions or updates, please contact [place] Please consult [email or web address] for contact info under        Signed, Elouise Munroe, MD  08/09/2020, 10:13 AM

## 2020-08-09 NOTE — Progress Notes (Signed)
Pharmacy Antibiotic Note  Brenda Davis is a 55 y.o. female admitted on 08/10/2020 presenting post arrest and concern for aspiration.  Pharmacy has been consulted for Unasyn dosing.  ESRD-HD, 4x wk PTA  Plan: Unasyn 3g IV every 24 hours - end date 7/24 Monitor HD schedule - last session yesterday, another one today 2/2 HD staffing issues  Height: 5' 5"  (165.1 cm) Weight: 124.6 kg (274 lb 11.1 oz) IBW/kg (Calculated) : 57  Temp (24hrs), Avg:99.1 F (37.3 C), Min:98.2 F (36.8 C), Max:99.7 F (37.6 C)  Recent Labs  Lab 07/19/2020 1254 08/07/2020 1255 08/12/2020 1451 07/31/2020 1525 08/07/20 0447 08/08/20 0142 08/09/20 0708  WBC 6.1  --  7.9  --  12.8* 14.4* 12.6*  CREATININE 7.78* 7.60* 8.09*  --  8.29* 10.06* 8.17*  LATICACIDVEN 3.3*  --   --  1.4  --   --   --      Estimated Creatinine Clearance: 10.3 mL/min (A) (by C-G formula based on SCr of 8.17 mg/dL (H)).    Allergies  Allergen Reactions   Amlodipine Swelling   Atorvastatin     Elevated LFT's   Clonidine Derivatives Swelling    Limbs swell   Doxycycline Nausea And Vomiting    "I threw up for 3 hours"   Welchol [Colesevelam Hcl] Nausea Only   Joetta Manners, PharmD, [facility] Emergency Medicine Clinical Pharmacist ED RPh Phone: [place]: [phone]

## 2020-08-09 NOTE — Progress Notes (Signed)
Dialysis Nurse Note  Difficult to dialyze pt today dt agitation episodes and then significant hypotension secondary to fentanyl IVP administration.   Dialysis tx abruptly terminated 1.5 hours early dt sudden clotting of dialyzer. Significant amt of clotting noted at venous head of dialyzer. Able to return all of arterial blood, but only partial amount of venous blood. When disconnecting venous blood line from needle line, long 3 inch clot noted hanging from venous line.   In addition to that issue, pt was becoming increasingly agitated thus increasing the risk of infiltration of access arm or inadvertently pulling out a dialysis needle.   Dr. Marval Regal and Stephania Fragmin PA were both contacted regarding issues during progression of dialysis treatment and for the early termination of the treatment.

## 2020-08-09 NOTE — Progress Notes (Signed)
Patient profusely bleeding from mouth with clots - seems to be secondary to a tongue and lip bite. Per Dr. Vaughan Browner, hold herparin gtt.

## 2020-08-09 NOTE — Progress Notes (Signed)
Video chat with pt's daughter arranged by Warren Lacy

## 2020-08-10 DIAGNOSIS — G9341 Metabolic encephalopathy: Secondary | ICD-10-CM

## 2020-08-10 DIAGNOSIS — Z992 Dependence on renal dialysis: Secondary | ICD-10-CM | POA: Diagnosis not present

## 2020-08-10 DIAGNOSIS — N186 End stage renal disease: Secondary | ICD-10-CM | POA: Diagnosis not present

## 2020-08-10 DIAGNOSIS — I469 Cardiac arrest, cause unspecified: Secondary | ICD-10-CM | POA: Diagnosis not present

## 2020-08-10 LAB — CBC
HCT: 26 % — ABNORMAL LOW (ref 36.0–46.0)
Hemoglobin: 8.5 g/dL — ABNORMAL LOW (ref 12.0–15.0)
MCH: 31.5 pg (ref 26.0–34.0)
MCHC: 32.7 g/dL (ref 30.0–36.0)
MCV: 96.3 fL (ref 80.0–100.0)
Platelets: 217 10*3/uL (ref 150–400)
RBC: 2.7 MIL/uL — ABNORMAL LOW (ref 3.87–5.11)
RDW: 16.4 % — ABNORMAL HIGH (ref 11.5–15.5)
WBC: 12.2 10*3/uL — ABNORMAL HIGH (ref 4.0–10.5)
nRBC: 0.2 % (ref 0.0–0.2)

## 2020-08-10 LAB — RENAL FUNCTION PANEL
Albumin: 2.6 g/dL — ABNORMAL LOW (ref 3.5–5.0)
Anion gap: 13 (ref 5–15)
BUN: 45 mg/dL — ABNORMAL HIGH (ref 6–20)
CO2: 26 mmol/L (ref 22–32)
Calcium: 8.7 mg/dL — ABNORMAL LOW (ref 8.9–10.3)
Chloride: 95 mmol/L — ABNORMAL LOW (ref 98–111)
Creatinine, Ser: 6.3 mg/dL — ABNORMAL HIGH (ref 0.44–1.00)
GFR, Estimated: 7 mL/min — ABNORMAL LOW (ref >60)
Glucose, Bld: 142 mg/dL — ABNORMAL HIGH (ref 70–99)
Phosphorus: 4.6 mg/dL (ref 2.5–4.6)
Potassium: 4.3 mmol/L (ref 3.5–5.1)
Sodium: 134 mmol/L — ABNORMAL LOW (ref 135–145)

## 2020-08-10 LAB — GLUCOSE, CAPILLARY
Glucose-Capillary: 135 mg/dL — ABNORMAL HIGH (ref 70–99)
Glucose-Capillary: 141 mg/dL — ABNORMAL HIGH (ref 70–99)
Glucose-Capillary: 158 mg/dL — ABNORMAL HIGH (ref 70–99)
Glucose-Capillary: 175 mg/dL — ABNORMAL HIGH (ref 70–99)
Glucose-Capillary: 181 mg/dL — ABNORMAL HIGH (ref 70–99)
Glucose-Capillary: 184 mg/dL — ABNORMAL HIGH (ref 70–99)

## 2020-08-10 MED ORDER — CARVEDILOL 3.125 MG PO TABS
3.1250 mg | ORAL_TABLET | Freq: Two times a day (BID) | ORAL | Status: DC
  Administered 2020-08-10 – 2020-08-11 (×3): 3.125 mg
  Filled 2020-08-10 (×6): qty 1

## 2020-08-10 MED ORDER — DEXMEDETOMIDINE HCL IN NACL 400 MCG/100ML IV SOLN
0.4000 ug/kg/h | INTRAVENOUS | Status: DC
  Administered 2020-08-10: 0.5 ug/kg/h via INTRAVENOUS
  Administered 2020-08-10 (×2): 0.4 ug/kg/h via INTRAVENOUS
  Administered 2020-08-11: 0.7 ug/kg/h via INTRAVENOUS
  Administered 2020-08-11 (×2): 0.4 ug/kg/h via INTRAVENOUS
  Administered 2020-08-12: 0.6 ug/kg/h via INTRAVENOUS
  Administered 2020-08-12: 0.4 ug/kg/h via INTRAVENOUS
  Administered 2020-08-12: 0.7 ug/kg/h via INTRAVENOUS
  Administered 2020-08-12: 0.5 ug/kg/h via INTRAVENOUS
  Administered 2020-08-13: 0.4 ug/kg/h via INTRAVENOUS
  Administered 2020-08-13: 1 ug/kg/h via INTRAVENOUS
  Administered 2020-08-13: 0.6 ug/kg/h via INTRAVENOUS
  Administered 2020-08-13: 0.5 ug/kg/h via INTRAVENOUS
  Administered 2020-08-13: 0.9 ug/kg/h via INTRAVENOUS
  Administered 2020-08-14: 1 ug/kg/h via INTRAVENOUS
  Administered 2020-08-14: 0.8 ug/kg/h via INTRAVENOUS
  Administered 2020-08-14: 1 ug/kg/h via INTRAVENOUS
  Filled 2020-08-10: qty 100
  Filled 2020-08-10: qty 200
  Filled 2020-08-10 (×2): qty 100
  Filled 2020-08-10: qty 200
  Filled 2020-08-10 (×5): qty 100
  Filled 2020-08-10: qty 200
  Filled 2020-08-10 (×2): qty 100
  Filled 2020-08-10: qty 200
  Filled 2020-08-10: qty 100

## 2020-08-10 NOTE — Progress Notes (Signed)
Patient ID: Brenda Davis, female   DOB: [birth year], 55 y.o.   MRN: [id number] S: remains off pressors. Eyes open, no purposeful responses.   O:BP 124/62 (BP Location: Right Leg)   Pulse 83   Temp 98.6 F (37 C) (Oral)   Resp 15   Ht 5' 5"  (1.651 m)   Wt 125.1 kg   SpO2 100%   BMI 45.89 kg/m   Intake/Output Summary (Last 24 hours) at 08/10/2020 1002 Last data filed at 08/10/2020 0800 Gross per 24 hour  Intake 1229.6 ml  Output --  Net 1229.6 ml    Intake/Output: I/O last 3 completed shifts: In: 1772.5 [I.V.:232.5; NG/GT:1440; IV Piggyback:100] Out: -   Intake/Output this shift:  Total I/O In: 40 [NG/GT:40] Out: -  Weight change: -1.6 kg DUK:[id number], eyes open, not following commands CVS:RRR Resp: ventilated BS bilaterally Abd: +BS, soft, NT/ND Ext: no edema, LUE AVF +T/B    Iron Studies: No results for input(s): IRON, TIBC, TRANSFERRIN, FERRITIN in the last 72 hours. Studies/Results: No results found.  aspirin  81 mg Per Tube Daily   carvedilol  3.125 mg Per Tube BID   chlorhexidine gluconate (MEDLINE KIT)  15 mL Mouth Rinse BID   Chlorhexidine Gluconate Cloth  6 each Topical Daily   Chlorhexidine Gluconate Cloth  6 each Topical Q0600   docusate  100 mg Per Tube BID   famotidine  10 mg Per Tube Daily   feeding supplement (PROSource TF)  90 mL Per Tube TID   fentaNYL (SUBLIMAZE) injection  50 mcg Intravenous Once   insulin aspart  0-15 Units Subcutaneous Q4H   mouth rinse  15 mL Mouth Rinse 10 times per day   multivitamin  1 tablet Per Tube Daily   rosuvastatin  20 mg Per Tube Daily   ticagrelor  90 mg Per Tube BID    Dialysis Orders: Center: [facility]  on 4 treatments/week   124kg  2/2.5 bath  3.5h   Hep 5000   LUE AVF  400/600     Assessment/Plan:  PEA arrest - s/p ACLS with ROSC in 10 minutes.  PCCM is primary. ECHO with EF of 45-50% and Grade I DD.  Required levophed but is now weaned off.  Acute hypoxic respiratory failure - s/p  PEA arrest s/p intubation in the field - possible aspiration.  Per PCCM  ESRD - on home HD. Here she got HD on 7/21 and on 7/22.  Plan HD MWF now. Next HD Monday.    Hypertension/volume  - stable at this time on exam, no edema, at dry wt  Anemia  - Hgb dropping.  Will start ESA and follow  Metabolic bone disease -  npo for now  Nutrition - npo for now. H/o CAD s/p NSTEMI PCI and DES DM type II - per primary. Acute metabolic encephalopathy - EEG without seizures but suggestive of profound, diffuse encephalopathy.   MRI without evidence of anoxic brain injury.   Kelly Splinter, MD 08/10/2020, 10:07 AM

## 2020-08-10 NOTE — Progress Notes (Signed)
[place] Progress Note Patient Name: ALENAH SARRIA DOB: 1965/06/17 MRN: [id number]   Date of Service  08/10/2020  HPI/Events of Note  Agitation - Felt to be at risk for self-extubation. Nursing request for bilateral soft wrist restraints.  eICU Interventions  Plan: Bilateral soft wrist restraints X 7 hours.     Intervention Category Major Interventions: Delirium, psychosis, severe agitation - evaluation and management  Keidra Withers [place] 08/10/2020, 1:43 AM

## 2020-08-10 NOTE — Progress Notes (Signed)
NAME:  Brenda Davis, MRN:  [id number], DOB:  24-Feb-1965, LOS: 4 ADMISSION DATE:  08/08/2020, CONSULTATION DATE:  08/08/2020 REFERRING MD:  Dr. Johnney Killian CHIEF COMPLAINT:  PEA arrest   History of Present Illness:  Brenda Davis is a 55 y.o. female with a PMH significant for ESRD on dialysis at home 4 days a week , CAD s/p PCI with DES, HTN, HLD, type 2 diabetes, morbid obesity, and anemia who was at an outpatient endoscopy procedure where she suffered a cardiac arrest. Per family she was undergoing a planned colonoscopy and endoscopy for a history of persistent nausea and vomiting. Per report patient had tolerated procedure well until she suddenly suffered a PEA cardiac arrest. Arrest time was estimated at 10 mins, she received 2 round of epi and compressions. Patient was intubated during arrest. Spouse denies any other acute complaints prior to admission.  Pertinent  Medical History  ESRD on HD 4 days/week, CAD s/p PCI with DES, HTN, HLD, T2DM, morbid obesity, anemia  Significant Hospital Events: Including procedures, antibiotic start and stop dates in addition to other pertinent events   7/19: admitted after 10 min PEA arrest during outpatient endoscopy/colonoscopy 7/20: some involuntary movements noted, EEG and MRI obtained 7/21: cardiology consulted, started IV Heparin 7/22 Heparin held due to profuse bleeding from the mouth?  Tongue bite , HD terminated 1.5 hours due to clotting and agitation ,RN reports wiggling toes to command  Antibiotics: IV Unasyn 7/19-   Interim History / Subjective:   Heparin was held yesterday due to bleeding from the mouth. Dialysis had to be terminated after 1.5 hours due to clotting 7/22 RN reports wiggling toes to command Afebrile Has needed intermittent doses of fentanyl and Versed for agitation  Objective   Blood pressure 124/62, pulse 83, temperature 98.6 F (37 C), temperature source Oral, resp. rate 15, height 5' 5"  (1.651  m), weight 125.1 kg, SpO2 100 %.    Vent Mode: PSV;CPAP FiO2 (%):  [40 %] 40 % Set Rate:  [16 bmp] 16 bmp Vt Set:  [450 mL] 450 mL PEEP:  [5 cmH20] 5 cmH20 Pressure Support:  [10 cmH20] 10 cmH20 Plateau Pressure:  [16 cmH20-20 cmH20] 19 cmH20   Intake/Output Summary (Last 24 hours) at 08/10/2020 0850 Last data filed at 08/10/2020 0800 Gross per 24 hour  Intake 1229.6 ml  Output --  Net 1229.6 ml    Filed Weights   08/08/20 1117 08/09/20 0500 08/10/20 0500  Weight: 123.7 kg 124.6 kg 125.1 kg    Examination: General: obese, NAD HENT: ETT in place, PERRL Lungs: Coarse breath sounds bilateral, no accessory muscle use Cardiovascular: RRR, normal S1/S2 without m/r/g Abdomen: +BS, soft, nontender Extremities: warm and dry, no significant lower extremity edema Neuro: Eyes open, spontaneously moves right leg, does not follow commands  Labs show mild hyponatremia, decrease in creatinine and BUN, mild leukocytosis stable and anemia  Resolved Hospital Problem list   Lactic acidosis  Assessment & Plan:  s/p 10 minute PEA arrest MRI brain and EEG without acute abnormalities. Patient not following commands, but is able to move all extremities.  P: -Monitor mental status -Trying to maintain off sedation but her agitation is challenging, will use low-dose Precedex if her BP permits and hopefully this will decrease bolus sedation use  Acute Hypoxic Respiratory Failure Aspiration PNA P: -Continue Unasyn (day #5/7) -Start spontaneous breathing trials  Elevated Troponin WMA on echo H/o CAD s/p PCI and DES Echo with reduction in EF from prior and  regional wall motion abnormalities. Trop trending flat ([id number] this morning). Possibly due to LAD disease versus stress cardiomyopathy postarrest P: -Cards following, appreciate recs -Heparin had to be stopped due to oral bleeding -Continue Brilinta, ASA, statin  -Consider cath if she has neurologic recovery  HTN Briefly required  levophed for hypotension, which has now resolved. P: --Decrease Coreg to 3.125 twice daily is remained that Precedex will drop blood pressure  ESRD on HD Last HD 08/09/20 P: -HD per nephro  T2DM CBGs 130s-170s P: -SSI -CBG monitoring  Best Practice (right click and "Reselect all SmartList Selections" daily)   Diet/type: tubefeeds DVT prophylaxis: systemic heparin GI prophylaxis: PPI Lines: N/A Foley:  N/A Code Status:  full code Last date of multidisciplinary goals of care discussion: 08/08/20, husband updated daily, with concerns about sedation addressed   Critical care time x 33 m  Kara Mead MD. FCCP. [facility] Pager : [phone]  If no response to pager , please call [phone] until 7 pm After 7:00 pm call Elink  [phone]   08/10/2020

## 2020-08-10 NOTE — Plan of Care (Signed)

## 2020-08-10 NOTE — Progress Notes (Signed)
   Progress Note  Patient Name: Brenda Davis Date of Encounter: 08/10/2020  Primary Cardiologist: Elouise Munroe, MD  Reviewed cardiology note from yesterday and interval hospital course, also discussed case with Dr. Elsworth Soho.  Overall, no major change from a cardiac perspective.  Heparin did have to be discontinued due to mouth bleeding, potentially from tongue bite.  She has been able to continue on aspirin, Brilinta, Coreg, and Crestor.  Systolic 17R to 116F and heart rate in the 70s to 80s in sinus rhythm by telemetry which I personally reviewed.  Patient does open eyes and moves extremities, although not necessarily to commands.  Lungs exhibit coarse breath sounds, cardiac exam with RRR no gallop.  Pertinent lab work includes potassium 4.3, BUN 45, creatinine 6.3, hemoglobin 8.5, platelets 217.  Follow-up chest x-ray pending.  ECG from July 19 showed sinus rhythm with LVH.  Echocardiogram from July 20 shows LVEF 45 to 50% with apical hypokinesis, mild diastolic dysfunction, and mild RV dysfunction.  Continue supportive measures and current cardiac regimen.  Depending on neurological recovery, can consider diagnostic cardiac catheterization.  Signed, Rozann Lesches, MD  08/10/2020, 9:24 AM

## 2020-08-10 NOTE — Progress Notes (Addendum)
[facility] ROUNDING NOTE   Subjective: Did have bleeding from the mouth yesterday- heparin gtt discontinued. Otherwise, no acute events overnight. Per d/w sister at bedside, opened her eyes this AM and she thought may have been tracking toward her voice.  Per notes, possibly some wiggling of the toes yesterday to command.  Otherwise, no purposeful movement per my d/w nursing staff.  Precedex low-dose restarted this a.m. for agitated movement.  Pathology results from upper endoscopy with normal duodenal biopsies and mild non-H. pylori gastritis.  Objective: Vital signs in last 24 hours: Temp:  [98.5 F (36.9 C)-99 F (37.2 C)] 98.6 F (37 C) (07/23 0740) Pulse Rate:  [64-92] 66 (07/23 1000) Resp:  [10-25] 16 (07/23 1000) BP: (68-172)/(47-104) 99/78 (07/23 1000) SpO2:  [100 %] 100 % (07/23 1000) FiO2 (%):  [40 %] 40 % (07/23 0800) Weight:  [125.1 kg] 125.1 kg (07/23 0500) Last BM Date: 08/09/20 General: NAD, intubated, sedated Lungs:  no w/r/r Heart:  RRR, no m/r/g Abdomen:  Soft, NT, ND, +BS    Intake/Output from previous day: 07/22 0701 - 07/23 0700 In: 1189.6 [I.V.:89.6; NG/GT:1000; IV Piggyback:100] Out: -  Intake/Output this shift: Total I/O In: 131.7 [I.V.:11.7; NG/GT:120] Out: -    Lab Results: Recent Labs    08/09/20 0708 08/09/20 1312 08/10/20 0037  WBC 12.6* 12.2* 12.2*  HGB 9.7* 8.8* 8.5*  PLT 243 222 217  MCV 96.9 96.5 96.3   BMET Recent Labs    08/09/20 0708 08/09/20 1312 08/10/20 0037  NA 134* 134* 134*  K 4.6 4.7 4.3  CL 90* 95* 95*  CO2 27 26 26   GLUCOSE 164* 180* 142*  BUN 57* 65* 45*  CREATININE 8.17* 8.57* 6.30*  CALCIUM 8.9 8.5* 8.7*   LFT Recent Labs    08/08/20 0142 08/09/20 0708 08/09/20 1312 08/10/20 0037  PROT 6.2*  --   --   --   ALBUMIN 2.8* 3.0* 2.7* 2.6*  AST 36  --   --   --   ALT 45*  --   --   --   ALKPHOS 66  --   --   --   BILITOT 0.7  --   --   --    PT/INR No results for input(s): INR in the  last 72 hours.    Imaging/Other results: No results found.    Assessment and Plan:  1) PEA arrest 2) Acute respiratory failure s/p PEA arrest 3) Acute metabolic encephalopathy - Patient suffered PEA arrest during colonoscopy on 7/19 s/p ACLS, intubation, epinephrine with ROSC within 10 minutes - Low-dose Precedex was restarted today due to agitation - SBT per primary CCS - Eyes open today, but otherwise no report of purposeful movement - EEG without seizure activity.  CT head and brain MRI otherwise unrevealing   4) ESRD - HD on Monday per Nephrology service  5) History of CAD 6) CHF - No longer on any pressors -Cardiology service following - Heparin GTT stopped yesterday - Cardiology svc considering cardiac cath pending neurologic recovery  7) Anemia - Did have bleeding from the mouth yesterday, otherwise no overt GI blood loss - Continue H/H trend - Starting ESA per Nephrology  I discussed her care with her sister at bedside this morning.  Allowed time to answer all questions to the best of my ability.  GI service will continue to check in on patient and family periodically.  Sincerely appreciate the excellent coordinated care in the ICU.   Lavena Bullion,  DO  08/10/2020, 10:28 AM [facility] Pager [phone]

## 2020-08-11 ENCOUNTER — Inpatient Hospital Stay (HOSPITAL_COMMUNITY): Payer: Medicare HMO

## 2020-08-11 DIAGNOSIS — G931 Anoxic brain damage, not elsewhere classified: Secondary | ICD-10-CM

## 2020-08-11 DIAGNOSIS — Z992 Dependence on renal dialysis: Secondary | ICD-10-CM | POA: Diagnosis not present

## 2020-08-11 DIAGNOSIS — I469 Cardiac arrest, cause unspecified: Secondary | ICD-10-CM | POA: Diagnosis not present

## 2020-08-11 DIAGNOSIS — N186 End stage renal disease: Secondary | ICD-10-CM | POA: Diagnosis not present

## 2020-08-11 LAB — RENAL FUNCTION PANEL
Albumin: 2.5 g/dL — ABNORMAL LOW (ref 3.5–5.0)
Anion gap: 15 (ref 5–15)
BUN: 75 mg/dL — ABNORMAL HIGH (ref 6–20)
CO2: 24 mmol/L (ref 22–32)
Calcium: 8.8 mg/dL — ABNORMAL LOW (ref 8.9–10.3)
Chloride: 96 mmol/L — ABNORMAL LOW (ref 98–111)
Creatinine, Ser: 8.37 mg/dL — ABNORMAL HIGH (ref 0.44–1.00)
GFR, Estimated: 5 mL/min — ABNORMAL LOW (ref >60)
Glucose, Bld: 169 mg/dL — ABNORMAL HIGH (ref 70–99)
Phosphorus: 6.2 mg/dL — ABNORMAL HIGH (ref 2.5–4.6)
Potassium: 5.4 mmol/L — ABNORMAL HIGH (ref 3.5–5.1)
Sodium: 135 mmol/L (ref 135–145)

## 2020-08-11 LAB — CBC
HCT: 26.7 % — ABNORMAL LOW (ref 36.0–46.0)
Hemoglobin: 8.7 g/dL — ABNORMAL LOW (ref 12.0–15.0)
MCH: 31.5 pg (ref 26.0–34.0)
MCHC: 32.6 g/dL (ref 30.0–36.0)
MCV: 96.7 fL (ref 80.0–100.0)
Platelets: 248 10*3/uL (ref 150–400)
RBC: 2.76 MIL/uL — ABNORMAL LOW (ref 3.87–5.11)
RDW: 16.4 % — ABNORMAL HIGH (ref 11.5–15.5)
WBC: 10.2 10*3/uL (ref 4.0–10.5)
nRBC: 0 % (ref 0.0–0.2)

## 2020-08-11 LAB — GLUCOSE, CAPILLARY
Glucose-Capillary: 137 mg/dL — ABNORMAL HIGH (ref 70–99)
Glucose-Capillary: 145 mg/dL — ABNORMAL HIGH (ref 70–99)
Glucose-Capillary: 162 mg/dL — ABNORMAL HIGH (ref 70–99)
Glucose-Capillary: 172 mg/dL — ABNORMAL HIGH (ref 70–99)
Glucose-Capillary: 177 mg/dL — ABNORMAL HIGH (ref 70–99)
Glucose-Capillary: 178 mg/dL — ABNORMAL HIGH (ref 70–99)

## 2020-08-11 LAB — BASIC METABOLIC PANEL
Anion gap: 16 — ABNORMAL HIGH (ref 5–15)
BUN: 92 mg/dL — ABNORMAL HIGH (ref 6–20)
CO2: 21 mmol/L — ABNORMAL LOW (ref 22–32)
Calcium: 8.4 mg/dL — ABNORMAL LOW (ref 8.9–10.3)
Chloride: 94 mmol/L — ABNORMAL LOW (ref 98–111)
Creatinine, Ser: 9.41 mg/dL — ABNORMAL HIGH (ref 0.44–1.00)
GFR, Estimated: 5 mL/min — ABNORMAL LOW (ref >60)
Glucose, Bld: 192 mg/dL — ABNORMAL HIGH (ref 70–99)
Potassium: 5.1 mmol/L (ref 3.5–5.1)
Sodium: 131 mmol/L — ABNORMAL LOW (ref 135–145)

## 2020-08-11 LAB — CULTURE, BLOOD (ROUTINE X 2)
Culture: NO GROWTH
Culture: NO GROWTH
Special Requests: ADEQUATE
Special Requests: ADEQUATE

## 2020-08-11 MED ORDER — WHITE PETROLATUM EX OINT
TOPICAL_OINTMENT | CUTANEOUS | Status: AC
  Filled 2020-08-11: qty 28.35

## 2020-08-11 MED ORDER — SODIUM ZIRCONIUM CYCLOSILICATE 10 G PO PACK
10.0000 g | PACK | Freq: Three times a day (TID) | ORAL | Status: AC
  Administered 2020-08-11 (×3): 10 g
  Filled 2020-08-11 (×3): qty 1

## 2020-08-11 MED ORDER — HEPARIN SODIUM (PORCINE) 5000 UNIT/ML IJ SOLN
5000.0000 [IU] | Freq: Three times a day (TID) | INTRAMUSCULAR | Status: DC
  Administered 2020-08-11 – 2020-08-15 (×13): 5000 [IU] via SUBCUTANEOUS
  Filled 2020-08-11 (×13): qty 1

## 2020-08-11 MED ORDER — POLYETHYLENE GLYCOL 3350 17 G PO PACK
17.0000 g | PACK | Freq: Two times a day (BID) | ORAL | Status: DC
  Administered 2020-08-11 – 2020-08-15 (×6): 17 g
  Filled 2020-08-11 (×6): qty 1

## 2020-08-11 MED ORDER — SENNOSIDES 8.8 MG/5ML PO SYRP
5.0000 mL | ORAL_SOLUTION | Freq: Two times a day (BID) | ORAL | Status: DC
  Administered 2020-08-11 – 2020-08-15 (×6): 5 mL
  Filled 2020-08-11 (×6): qty 5

## 2020-08-11 NOTE — Plan of Care (Signed)

## 2020-08-11 NOTE — Progress Notes (Addendum)
NAME:  Brenda Davis, MRN:  [id number], DOB:  05/05/1965, LOS: 5 ADMISSION DATE:  08/17/2020, CONSULTATION DATE:  08/08/2020 REFERRING MD:  Dr. Johnney Killian CHIEF COMPLAINT:  PEA arrest   History of Present Illness:  Brenda Davis is a 55 y.o. female with a PMH significant for ESRD on dialysis at home 4 days a week , CAD s/p PCI with DES, HTN, HLD, type 2 diabetes, morbid obesity, and anemia who was at an outpatient endoscopy procedure where she suffered a cardiac arrest. Per family she was undergoing a planned colonoscopy and endoscopy for a history of persistent nausea and vomiting. Per report patient had tolerated procedure well until she suddenly suffered a PEA cardiac arrest. Arrest time was estimated at 10 mins, she received 2 round of epi and compressions. Patient was intubated during arrest. Spouse denies any other acute complaints prior to admission.  Pertinent  Medical History  ESRD on HD 4 days/week, CAD s/p PCI with DES, HTN, HLD, T2DM, morbid obesity, anemia  Significant Hospital Events: Including procedures, antibiotic start and stop dates in addition to other pertinent events   7/19: admitted after 10 min PEA arrest during outpatient endoscopy/colonoscopy 7/20: some involuntary movements noted, EEG and MRI obtained 7/21: cardiology consulted, started IV Heparin 7/22 Heparin held due to profuse bleeding from the mouth?  Tongue bite , HD terminated 1.5 hours due to clotting and agitation ,RN reports wiggling toes to command 7/23 Precedex   Antibiotics: IV Unasyn 7/19-   Interim History / Subjective:   Critically ill, intubated Low-grade febrile Remains on Precedex drip   Objective   Blood pressure 115/74, pulse 65, temperature 100.1 F (37.8 C), temperature source Oral, resp. rate 17, height 5' 5"  (1.651 m), weight 125.1 kg, SpO2 100 %.    Vent Mode: PSV;CPAP FiO2 (%):  [40 %] 40 % Set Rate:  [16 bmp] 16 bmp Vt Set:  [450 mL] 450 mL PEEP:  [5  cmH20] 5 cmH20 Pressure Support:  [10 cmH20] 10 cmH20 Plateau Pressure:  [19 cmH20] 19 cmH20   Intake/Output Summary (Last 24 hours) at 08/11/2020 1042 Last data filed at 08/11/2020 0900 Gross per 24 hour  Intake 1431.93 ml  Output --  Net 1431.93 ml    Filed Weights   08/08/20 1117 08/09/20 0500 08/10/20 0500  Weight: 123.7 kg 124.6 kg 125.1 kg    Examination: General: obese, NAD HENT: ETT in place, PERRL Lungs: No accessory muscle use, clear breath sounds bilateral Cardiovascular: RRR, normal S1/S2 without m/r/g Abdomen: +BS, soft, nontender Extremities: warm and dry, no significant lower extremity edema Neuro: Eyes open, favors right leg and moves spontaneously, does not follow commands  Labs show mild hyperkalemia, decrease leukocytosis, stable anemia  Resolved Hospital Problem list   Lactic acidosis  Assessment & Plan:  Anoxic encephalopathy MRI brain and EEG without acute abnormalities. Patient not following commands to my exam, RN charted 7/22 that she moved toes to command P: -Monitor mental status -Continue Precedex drip, goal RASS 0  Acute Hypoxic Respiratory Failure Aspiration PNA P: -Continue Unasyn (day #6/7) -Tolerates spontaneous breathing trials but needs airway protection while agitation ongoing  PEA arrest Elevated Troponin WMA on echo H/o CAD s/p PCI and DES Echo with reduction in EF from prior and regional wall motion abnormalities. Trop trending down. Possibly due to LAD disease versus stress cardiomyopathy postarrest P: -Cards following, appreciate recs -Heparin had to be stopped due to oral bleeding -Continue Brilinta, ASA, statin -Consider cath if she has neurologic recovery  HTN Briefly required levophed for hypotension, which has now resolved. P: --Decrease Coreg to 3.125 twice daily while on Precedex  ESRD on HD Last HD 08/09/20 Hyperkalemia P: -HD per nephro -Lokelma x1  T2DM CBGs 130s-170s P: -SSI -CBG  monitoring  Constipation -added MiraLAX and senna  Best Practice (right click and "Reselect all SmartList Selections" daily)   Diet/type: tubefeeds DVT prophylaxis: prophylactic heparin  GI prophylaxis: PPI Lines: N/A Foley:  N/A Code Status:  full code Last date of multidisciplinary goals of care discussion: 08/08/20, husband updated daily, his concerns about sedation addressed   Critical care time x 31 m  Kara Mead MD. FCCP. [facility] Pager : [phone]  If no response to pager , please call [phone] until 7 pm After 7:00 pm call Elink  [phone]   08/11/2020

## 2020-08-11 NOTE — Progress Notes (Signed)
Patient ID: MAANYA HIPPERT, female   DOB: 1965-04-07, 55 y.o.   MRN: [id number] S: remains off pressors. Eyes open, still not responsive  O:BP 113/63   Pulse 76   Temp 98.5 F (36.9 C) (Axillary)   Resp (!) 23   Ht 5' 5" (1.651 m)   Wt 125.1 kg   SpO2 100%   BMI 45.89 kg/m   Intake/Output Summary (Last 24 hours) at 08/11/2020 1526 Last data filed at 08/11/2020 1300 Gross per 24 hour  Intake 1433.83 ml  Output --  Net 1433.83 ml    Intake/Output: I/O last 3 completed shifts: In: 1895.9 [I.V.:276; NG/GT:1520; IV Piggyback:99.9] Out: -   Intake/Output this shift:  Total I/O In: 346.6 [I.V.:66.6; NG/GT:280] Out: -  Weight change:  WNU:[id number], eyes open, not following commands CVS:RRR Resp: ventilated BS bilaterally Abd: +BS, soft, NT/ND Ext: no edema, LUE AVF +T/B    Iron Studies: No results for input(s): IRON, TIBC, TRANSFERRIN, FERRITIN in the last 72 hours. Studies/Results: DG Chest Port 1 View  Result Date: 08/11/2020 CLINICAL DATA:  Acute respiratory failure, post CPR EXAM: PORTABLE CHEST 1 VIEW COMPARISON:  08/17/2020 chest radiograph. FINDINGS: Enteric tube enters stomach with the tip not seen on this image. Endotracheal tube tip is 4.8 cm above the carina. Stable cardiomediastinal silhouette with mild cardiomegaly. No pneumothorax. No pleural effusion. Patchy hazy and streaky opacities throughout both lungs, improved particularly in the right lung. IMPRESSION: 1. Well-positioned support structures. 2. Mild cardiomegaly. Patchy hazy and streaky opacities throughout both lungs, improved particularly in the right lung, favor improving pulmonary edema. Electronically Signed   By: Ilona Sorrel M.D.   On: 08/11/2020 08:00    aspirin  81 mg Per Tube Daily   carvedilol  3.125 mg Per Tube BID   chlorhexidine gluconate (MEDLINE KIT)  15 mL Mouth Rinse BID   Chlorhexidine Gluconate Cloth  6 each Topical Daily   docusate  100 mg Per Tube BID   famotidine  10 mg Per  Tube Daily   feeding supplement (PROSource TF)  90 mL Per Tube TID   heparin injection (subcutaneous)  5,000 Units Subcutaneous Q8H   insulin aspart  0-15 Units Subcutaneous Q4H   mouth rinse  15 mL Mouth Rinse 10 times per day   multivitamin  1 tablet Per Tube Daily   polyethylene glycol  17 g Per Tube BID   rosuvastatin  20 mg Per Tube Daily   sennosides  5 mL Per Tube BID   sodium zirconium cyclosilicate  10 g Per Tube TID   ticagrelor  90 mg Per Tube BID   white petrolatum        Dialysis Orders: Center: [facility]  on 4 treatments/week   124kg  2/2.5 bath  3.5h   Hep 5000   LUE AVF  400/600     Assessment/Plan:  PEA arrest - s/p ACLS with ROSC in 10 minutes.  PCCM is primary. ECHO with EF of 45-50% and Grade I DD.  Required levophed but is now weaned off.  Acute hypoxic respiratory failure - s/p PEA arrest s/p intubation in the field - possible aspiration.  Per PCCM  ESRD - on home HD. Here she got HD on 7/21 and on 7/22.  Plan HD MWF now. Next HD tomorrow.   Hypertension/volume  - stable at this time on exam, no edema, at dry wt  Anemia  - Hgb dropping.  Will start ESA and follow  Metabolic bone disease -  npo for now  Nutrition - npo for now. H/o CAD s/p NSTEMI PCI and DES DM type II - per primary. Acute metabolic encephalopathy - EEG without seizures but suggestive of profound, diffuse encephalopathy.   MRI without evidence of anoxic brain injury.   Kelly Splinter, MD 08/11/2020, 3:26 PM

## 2020-08-11 NOTE — Progress Notes (Signed)
eLink Physician-Brief Progress Note Patient Name: Brenda Davis DOB: 08/22/1965 MRN: [id number]   Date of Service  08/11/2020  HPI/Events of Note  K 5.4. ESRD. Next HD planned for Monday.   eICU Interventions  Ordered Lokelma 10g TID x3 doses. Recheck BMP at 1700 hrs.     Intervention Category Intermediate Interventions: Electrolyte abnormality - evaluation and management  Marily Lente Misty Rago 08/11/2020, 4:46 AM

## 2020-08-12 ENCOUNTER — Inpatient Hospital Stay (HOSPITAL_COMMUNITY): Payer: Medicare HMO

## 2020-08-12 DIAGNOSIS — I469 Cardiac arrest, cause unspecified: Principal | ICD-10-CM

## 2020-08-12 DIAGNOSIS — R69 Illness, unspecified: Secondary | ICD-10-CM

## 2020-08-12 DIAGNOSIS — Z452 Encounter for adjustment and management of vascular access device: Secondary | ICD-10-CM

## 2020-08-12 DIAGNOSIS — G931 Anoxic brain damage, not elsewhere classified: Secondary | ICD-10-CM | POA: Diagnosis not present

## 2020-08-12 LAB — PREPARE RBC (CROSSMATCH)

## 2020-08-12 LAB — CBC
HCT: 20.3 % — ABNORMAL LOW (ref 36.0–46.0)
HCT: 22.8 % — ABNORMAL LOW (ref 36.0–46.0)
Hemoglobin: 6.6 g/dL — CL (ref 12.0–15.0)
Hemoglobin: 7.6 g/dL — ABNORMAL LOW (ref 12.0–15.0)
MCH: 31 pg (ref 26.0–34.0)
MCH: 31 pg (ref 26.0–34.0)
MCHC: 32.5 g/dL (ref 30.0–36.0)
MCHC: 33.3 g/dL (ref 30.0–36.0)
MCV: 95.3 fL (ref 80.0–100.0)
MCV: 93.1 fL (ref 80.0–100.0)
Platelets: 223 10*3/uL (ref 150–400)
Platelets: 233 10*3/uL (ref 150–400)
RBC: 2.13 MIL/uL — ABNORMAL LOW (ref 3.87–5.11)
RBC: 2.45 MIL/uL — ABNORMAL LOW (ref 3.87–5.11)
RDW: 16.3 % — ABNORMAL HIGH (ref 11.5–15.5)
RDW: 16.1 % — ABNORMAL HIGH (ref 11.5–15.5)
WBC: 10.1 10*3/uL (ref 4.0–10.5)
WBC: 13.7 10*3/uL — ABNORMAL HIGH (ref 4.0–10.5)
nRBC: 0 % (ref 0.0–0.2)
nRBC: 0 % (ref 0.0–0.2)

## 2020-08-12 LAB — GLUCOSE, CAPILLARY
Glucose-Capillary: 139 mg/dL — ABNORMAL HIGH (ref 70–99)
Glucose-Capillary: 148 mg/dL — ABNORMAL HIGH (ref 70–99)
Glucose-Capillary: 170 mg/dL — ABNORMAL HIGH (ref 70–99)
Glucose-Capillary: 181 mg/dL — ABNORMAL HIGH (ref 70–99)
Glucose-Capillary: 190 mg/dL — ABNORMAL HIGH (ref 70–99)
Glucose-Capillary: 232 mg/dL — ABNORMAL HIGH (ref 70–99)

## 2020-08-12 LAB — RENAL FUNCTION PANEL
Albumin: 2.2 g/dL — ABNORMAL LOW (ref 3.5–5.0)
Anion gap: 15 (ref 5–15)
BUN: 92 mg/dL — ABNORMAL HIGH (ref 6–20)
CO2: 19 mmol/L — ABNORMAL LOW (ref 22–32)
Calcium: 7.1 mg/dL — ABNORMAL LOW (ref 8.9–10.3)
Chloride: 99 mmol/L (ref 98–111)
Creatinine, Ser: 8.19 mg/dL — ABNORMAL HIGH (ref 0.44–1.00)
GFR, Estimated: 5 mL/min — ABNORMAL LOW (ref >60)
Glucose, Bld: 143 mg/dL — ABNORMAL HIGH (ref 70–99)
Phosphorus: 5.7 mg/dL — ABNORMAL HIGH (ref 2.5–4.6)
Potassium: 4.2 mmol/L (ref 3.5–5.1)
Sodium: 133 mmol/L — ABNORMAL LOW (ref 135–145)

## 2020-08-12 LAB — LACTIC ACID, PLASMA: Lactic Acid, Venous: 0.7 mmol/L (ref 0.5–1.9)

## 2020-08-12 MED ORDER — SODIUM CHLORIDE 0.9 % IV SOLN
250.0000 mL | INTRAVENOUS | Status: DC
  Administered 2020-08-14: 250 mL via INTRAVENOUS

## 2020-08-12 MED ORDER — FENTANYL CITRATE (PF) 100 MCG/2ML IJ SOLN
25.0000 ug | INTRAMUSCULAR | Status: DC | PRN
  Administered 2020-08-12 – 2020-08-14 (×7): 100 ug via INTRAVENOUS
  Filled 2020-08-12 (×8): qty 2

## 2020-08-12 MED ORDER — LACTATED RINGERS IV BOLUS
500.0000 mL | Freq: Once | INTRAVENOUS | Status: AC
  Administered 2020-08-12: 500 mL via INTRAVENOUS

## 2020-08-12 MED ORDER — PRISMASOL BGK 4/2.5 32-4-2.5 MEQ/L REPLACEMENT SOLN
Status: DC
  Filled 2020-08-12 (×7): qty 5000

## 2020-08-12 MED ORDER — MIDAZOLAM HCL 2 MG/2ML IJ SOLN: 1.0000 mg | INTRAMUSCULAR | Status: DC | PRN

## 2020-08-12 MED ORDER — FENTANYL CITRATE (PF) 100 MCG/2ML IJ SOLN
50.0000 ug | Freq: Once | INTRAMUSCULAR | Status: AC
Start: 2020-08-12 — End: 2020-08-12
  Administered 2020-08-12: 50 ug via INTRAVENOUS

## 2020-08-12 MED ORDER — SODIUM CHLORIDE 0.9% IV SOLUTION: Freq: Once | INTRAVENOUS | Status: AC

## 2020-08-12 MED ORDER — PRISMASOL BGK 4/2.5 32-4-2.5 MEQ/L REPLACEMENT SOLN
Status: DC
  Filled 2020-08-12 (×4): qty 5000

## 2020-08-12 MED ORDER — SODIUM CHLORIDE 0.9 % FOR CRRT: INTRAVENOUS_CENTRAL | Status: DC | PRN

## 2020-08-12 MED ORDER — NOREPINEPHRINE 4 MG/250ML-% IV SOLN
2.0000 ug/min | INTRAVENOUS | Status: DC
Start: 2020-08-12 — End: 2020-08-12
  Administered 2020-08-12: 5 ug/min via INTRAVENOUS
  Filled 2020-08-12: qty 250

## 2020-08-12 MED ORDER — MIDAZOLAM HCL 2 MG/2ML IJ SOLN
1.0000 mg | INTRAMUSCULAR | Status: DC | PRN
  Administered 2020-08-12 (×2): 1 mg via INTRAVENOUS
  Filled 2020-08-12 (×2): qty 2

## 2020-08-12 MED ORDER — MIDODRINE HCL 5 MG PO TABS
5.0000 mg | ORAL_TABLET | Freq: Three times a day (TID) | ORAL | Status: DC
  Administered 2020-08-13: 5 mg
  Filled 2020-08-12: qty 1

## 2020-08-12 MED ORDER — SODIUM CHLORIDE 0.9 % IV SOLN: INTRAVENOUS | Status: DC | PRN

## 2020-08-12 MED ORDER — PRISMASOL BGK 4/2.5 32-4-2.5 MEQ/L EC SOLN
Status: DC
  Filled 2020-08-12 (×29): qty 5000

## 2020-08-12 MED ORDER — HEPARIN SODIUM (PORCINE) 1000 UNIT/ML DIALYSIS
1000.0000 [IU] | INTRAMUSCULAR | Status: DC | PRN
  Filled 2020-08-12: qty 6

## 2020-08-12 MED ORDER — ALTEPLASE 2 MG IJ SOLR
2.0000 mg | Freq: Once | INTRAMUSCULAR | Status: DC | PRN
  Filled 2020-08-12: qty 2

## 2020-08-12 MED ORDER — NOREPINEPHRINE 4 MG/250ML-% IV SOLN
0.0000 ug/min | INTRAVENOUS | Status: DC
  Administered 2020-08-12: 8 ug/min via INTRAVENOUS
  Administered 2020-08-12: 12 ug/min via INTRAVENOUS
  Administered 2020-08-13: 4 ug/min via INTRAVENOUS
  Administered 2020-08-14: 3 ug/min via INTRAVENOUS
  Filled 2020-08-12 (×3): qty 250

## 2020-08-12 MED ORDER — DARBEPOETIN ALFA 60 MCG/0.3ML IJ SOSY
60.0000 ug | PREFILLED_SYRINGE | INTRAMUSCULAR | Status: DC
  Administered 2020-08-12: 60 ug via INTRAVENOUS
  Filled 2020-08-12: qty 0.3

## 2020-08-12 NOTE — Procedures (Signed)
Arterial Catheter Insertion Procedure Note  KIERAH GOATLEY  [id number]  [birth year]  Date:08/12/20  Time:2:09 PM    Provider Performing: Judith Part    Procedure: Insertion of Arterial Line [id number]) without US guidance  Indication(s) Blood pressure monitoring and/or need for frequent ABGs  Consent Unable to obtain consent due to emergent nature of procedure.  Anesthesia None   Time Out Verified patient identification, verified procedure, site/side was marked, verified correct patient position, special equipment/implants available, medications/allergies/relevant history reviewed, required imaging and test results available.   Sterile Technique Maximal sterile technique including full sterile barrier drape, hand hygiene, sterile gown, sterile gloves, mask, hair covering, sterile ultrasound probe cover (if used).   Procedure Description Area of catheter insertion was cleaned with chlorhexidine and draped in sterile fashion. Without real-time ultrasound guidance an arterial catheter was placed into the right radial artery.  Appropriate arterial tracings confirmed on monitor.     Complications/Tolerance None; patient tolerated the procedure well.   EBL Minimal   Specimen(s) None

## 2020-08-12 NOTE — Progress Notes (Signed)
Patient ID: Brenda Davis, female   DOB: July 23, 1965, 55 y.o.   MRN: [id number] S: remains off pressors. Eyes open, still not responsive  O:BP (!) 84/31   Pulse 68   Temp 99.9 F (37.7 C) (Oral)   Resp 18   Ht 5' 5"  (1.651 m)   Wt 125.9 kg   SpO2 94%   BMI 46.19 kg/m   Intake/Output Summary (Last 24 hours) at 08/12/2020 1319 Last data filed at 08/12/2020 0800 Gross per 24 hour  Intake 1159.1 ml  Output 0 ml  Net 1159.1 ml    Intake/Output: I/O last 3 completed shifts: In: 2428.2 [I.V.:527.9; NG/GT:1800; IV Piggyback:100.3] Out: 0   Intake/Output this shift:  Total I/O In: 40 [NG/GT:40] Out: -  Weight change:  VOZ:[id number], eyes open, not following commands CVS:RRR Resp: ventilated BS bilaterally Abd: +BS, soft, NT/ND Ext: no edema, LUE AVF +T/B    Iron Studies: No results for input(s): IRON, TIBC, TRANSFERRIN, FERRITIN in the last 72 hours. Studies/Results: DG Chest Port 1 View  Result Date: 08/11/2020 CLINICAL DATA:  Acute respiratory failure, post CPR EXAM: PORTABLE CHEST 1 VIEW COMPARISON:  08/01/2020 chest radiograph. FINDINGS: Enteric tube enters stomach with the tip not seen on this image. Endotracheal tube tip is 4.8 cm above the carina. Stable cardiomediastinal silhouette with mild cardiomegaly. No pneumothorax. No pleural effusion. Patchy hazy and streaky opacities throughout both lungs, improved particularly in the right lung. IMPRESSION: 1. Well-positioned support structures. 2. Mild cardiomegaly. Patchy hazy and streaky opacities throughout both lungs, improved particularly in the right lung, favor improving pulmonary edema. Electronically Signed   By: Ilona Sorrel M.D.   On: 08/11/2020 08:00    sodium chloride   Intravenous Once   aspirin  81 mg Per Tube Daily   carvedilol  3.125 mg Per Tube BID   chlorhexidine gluconate (MEDLINE KIT)  15 mL Mouth Rinse BID   Chlorhexidine Gluconate Cloth  6 each Topical Daily   docusate  100 mg Per Tube BID    famotidine  10 mg Per Tube Daily   feeding supplement (PROSource TF)  90 mL Per Tube TID   heparin injection (subcutaneous)  5,000 Units Subcutaneous Q8H   insulin aspart  0-15 Units Subcutaneous Q4H   mouth rinse  15 mL Mouth Rinse 10 times per day   multivitamin  1 tablet Per Tube Daily   polyethylene glycol  17 g Per Tube BID   rosuvastatin  20 mg Per Tube Daily   sennosides  5 mL Per Tube BID   ticagrelor  90 mg Per Tube BID    Dialysis Orders: Center: [facility]  on 4 treatments/week   124kg  2/2.5 bath  3.5h   Hep 5000   LUE AVF  400/600     Assessment/Plan:  PEA arrest - s/p ACLS with ROSC in 10 minutes. ECHO with EF of 45-50% and Grade I DD. Per CCM.  Acute hypoxic respiratory failure - s/p PEA arrest w/ intubation in the field - possible aspiration.  Per CCM  ESRD - on HD 4x per wk at home. Here will do MWF HD. HD today.    Hypertension/volume  - stable at this time on exam, no edema, 1-2kg up   Anemia  - Hgb dropped from 11 on admit to 8's and today Hb 6.6. will start ESA w/ darbe 60ug weekly on monday.   Metabolic bone disease -  npo for now  Nutrition - npo for now. H/o CAD s/p  NSTEMI PCI and DES DM type II - per primary. Acute metabolic encephalopathy - EEG without seizures but suggestive of profound, diffuse encephalopathy.   MRI without evidence of anoxic brain injury.   Kelly Splinter, MD 08/12/2020, 1:19 PM

## 2020-08-12 NOTE — Progress Notes (Signed)
RT advanced ETT 2cm from 23@lip  to 25@lip  per MD order. RT will continue to monitor.

## 2020-08-12 NOTE — Procedures (Signed)
Central Venous Catheter Insertion Procedure Note  Brenda Davis  [id number]  [birth year]  Date:08/12/20  Time:4:51 PM   Provider Performing:Donica Derouin D Rollene Rotunda   Procedure: Insertion of Non-tunneled Central Venous Catheter(36556)with US guidance (23361)    Indication(s) Medication administration and Hemodialysis  Consent Risks of the procedure as well as the alternatives and risks of each were explained to the patient and/or caregiver.  Consent for the procedure was obtained and is signed in the bedside chart  Anesthesia Topical only with 1% lidocaine   Timeout Verified patient identification, verified procedure, site/side was marked, verified correct patient position, special equipment/implants available, medications/allergies/relevant history reviewed, required imaging and test results available.  Sterile Technique Maximal sterile technique including full sterile barrier drape, hand hygiene, sterile gown, sterile gloves, mask, hair covering, sterile ultrasound probe cover (if used).  Procedure Description Area of catheter insertion was cleaned with chlorhexidine and draped in sterile fashion.   With real-time ultrasound guidance a HD catheter was placed into the right internal jugular vein.  Nonpulsatile blood flow and easy flushing noted in all ports.  The catheter was sutured in place and sterile dressing applied.  Complications/Tolerance None; patient tolerated the procedure well. Chest X-ray is ordered to verify placement for internal jugular or subclavian cannulation.  Chest x-ray is not ordered for femoral cannulation.  EBL Minimal  Specimen(s) None  JD Rexene Agent [facility] 08/12/2020, 4:51 PM  Please see [email or web address] for pager details.  From 7A-7P if no response, please call [phone]. After hours, please call ELink [phone].

## 2020-08-12 NOTE — Progress Notes (Signed)
NAME:  Brenda Davis, MRN:  [id number], DOB:  Feb 12, 1965, LOS: 6 ADMISSION DATE:  07/22/2020, CONSULTATION DATE:  07/21/2020 REFERRING MD:  Dr Johnney Killian, CHIEF COMPLAINT:  PEA arrest   History of Present Illness:  Brenda Davis is a 55 y.o. female with a PMH significant for ESRD on dialysis at home 4 days a week, CAD s/p PCI with DES, HTN, HLD, type 2 diabetes, morbid obesity, and anemia who was at an outpatient endoscopy procedure where she suffered a cardiac arrest. Per family she was undergoing a planned colonoscopy and endoscopy for a history of persistent nausea and vomiting. Per report patient had tolerated procedure well until she suddenly suffered a PEA cardiac arrest. Arrest time was estimated at 10 mins, she received 2 round of epi and compressions. Patient was intubated during arrest. Spouse denies any other acute complaints prior to admission.  Pertinent  Medical History  ESRD on HD 4 days/week, CAD s/p PCI with DES, HTN, HLD, T2DM, morbid obesity, anemia  Significant Hospital Events: Including procedures, antibiotic start and stop dates in addition to other pertinent events   7/19: admitted after 10 min PEA arrest during outpatient endoscopy/colonoscopy 7/20: some involuntary movements noted, EEG and MRI obtained 7/21: cardiology consulted, started IV Heparin 7/22: Heparin held due to profuse bleeding from the mouth, ?tongue bite , HD terminated 1.5 hours due to clotting and agitation, RN reported wiggling toes to command 7/23: Precedex started for agitation  Interim History / Subjective:  Hgb dropped to 6.6 this morning (from 8.7 previously). Otherwise no acute events overnight. Patient remains unable to provide any history. Off Precedex gtt.  Objective   Blood pressure (!) 94/43, pulse 67, temperature 98.8 F (37.1 C), temperature source Oral, resp. rate (!) 21, height 5' 5"  (1.651 m), weight 125.9 kg, SpO2 100 %.    Vent Mode: PRVC FiO2 (%):  [40 %]  40 % Set Rate:  [16 bmp] 16 bmp Vt Set:  [450 mL] 450 mL PEEP:  [5 cmH20] 5 cmH20 Pressure Support:  [10 cmH20] 10 cmH20 Plateau Pressure:  [19 cmH20-21 cmH20] 21 cmH20   Intake/Output Summary (Last 24 hours) at 08/12/2020 0723 Last data filed at 08/12/2020 0700 Gross per 24 hour  Intake 1465.72 ml  Output 0 ml  Net 1465.72 ml   Filed Weights   08/09/20 0500 08/10/20 0500 08/12/20 0152  Weight: 124.6 kg 125.1 kg 125.9 kg    Examination: General: obese, NAD HENT: ETT in place, tongue bite noted on distal tip of tongue Lungs: normal effort, CTAB Cardiovascular: RRR, normal S1/S2 without m/r/g Abdomen: +BS, soft, nontender Extremities: warm and dry Neuro: Eyes open, moves bilateral lower extremities spontaneously, does not follow commands  Resolved Hospital Problem list   Lactic acidosis Elevated troponin  Assessment & Plan:  Anoxic encephalopathy MRI brain and EEG without acute abnormalities. Patient not following commands, no purposeful movement appreciated. Required Precedex gtt for agitation, which was stopped this morning. P: -Monitor mental status -Consult neurology  Acute Hypoxic Respiratory Failure Aspiration PNA Did well with SBT, but mental status has prevented extubation P: -Continue Unasyn (day 7/7) -Continue vent support -Daily SBT  Anemia Hgb 6.6 this morning (from 8.7). Likely due to oral bleeding from tongue bite P: -Transfuse 1u -Monitor CBC -Continuing Brilinta and ASA for now per cards  Hypotension, Hx of HTN BP has been soft this morning. P: -Precedex gtt stopped -Plan to transfuse 1u for anemia -Holding Coreg -Monitor BP  PEA arrest Elevated troponin WMA on echo  H/o CAD s/p PCI and DES P: -Cardiology following, appreciate recs -Continue Brilinta, ASA, statin -Previously on Heparin gtt, planned for 48hrs but d/c'd early due to bleeding from tongue bite -Per cards, repeat echo vs cath pending neuro recovery  ESRD on HD Last HD  08/09/20 P: -Plan for HD today -Nephro following, appreciate recs  T2DM P: -SSI -CBG monitoring  Best Practice (right click and "Reselect all SmartList Selections" daily)   Diet/type: tubefeeds DVT prophylaxis: prophylactic heparin  GI prophylaxis: PPI Lines: Dialysis Catheter Foley:  N/A Code Status:  full code Last date of multidisciplinary goals of care discussion: 08/09/20, husband updated daily  Labs   CBC: Recent Labs  Lab 08/07/2020 1254 07/22/2020 1255 08/09/20 0708 08/09/20 1312 08/10/20 0037 08/11/20 0203 08/12/20 0157  WBC 6.1   < > 12.6* 12.2* 12.2* 10.2 10.1  NEUTROABS 4.2  --   --   --   --   --   --   HGB 10.7*   < > 9.7* 8.8* 8.5* 8.7* 6.6*  HCT 34.2*   < > 30.8* 27.9* 26.0* 26.7* 20.3*  MCV 98.6   < > 96.9 96.5 96.3 96.7 95.3  PLT 243   < > 243 222 217 248 223   < > = values in this interval not displayed.    Basic Metabolic Panel: Recent Labs  Lab 07/28/2020 1254 08/18/2020 1255 08/08/2020 1451 08/05/2020 1706 08/07/20 0447 08/07/20 1647 08/08/20 0142 08/09/20 0708 08/09/20 1312 08/10/20 0037 08/11/20 0203 08/11/20 1642 08/12/20 0157  NA 133*   < >  --   --  133*  --    < > 134* 134* 134* 135 131* 133*  K 5.2*   < >  --   --  4.9  --    < > 4.6 4.7 4.3 5.4* 5.1 4.2  CL 90*   < >  --   --  92*  --    < > 90* 95* 95* 96* 94* 99  CO2 29  --   --   --  25  --    < > 27 26 26 24  21* 19*  GLUCOSE 168*   < >  --   --  130*  --    < > 164* 180* 142* 169* 192* 143*  BUN 28*   < >  --   --  38*  --    < > 57* 65* 45* 75* 92* 92*  CREATININE 7.78*   < > 8.09*  --  8.29*  --    < > 8.17* 8.57* 6.30* 8.37* 9.41* 8.19*  CALCIUM 9.4  --   --   --  9.6  --    < > 8.9 8.5* 8.7* 8.8* 8.4* 7.1*  MG 2.5*  --  2.4 2.4 2.3 2.3  --   --   --   --   --   --   --   PHOS 6.4*  --  5.7* 5.6* 5.1* 6.4*  --  5.8* 6.0* 4.6 6.2*  --  5.7*   < > = values in this interval not displayed.    Allergies Allergies  Allergen Reactions   Amlodipine Swelling   Atorvastatin      Elevated LFT's   Clonidine Derivatives Swelling    Limbs swell   Doxycycline Nausea And Vomiting    "I threw up for 3 hours"   Welchol [Colesevelam Hcl] Nausea Only     Home Medications  Prior to Admission medications  Medication Sig Start Date End Date Taking? Authorizing Provider  acetaminophen (TYLENOL) 500 MG tablet Take 1,000 mg by mouth every 6 (six) hours as needed for moderate pain or headache.   Yes [provider]  B Complex-C-Biotin-D-FA (DIALYVITE 800 PLUS D) 800 MCG WAFR Take by mouth. 09/04/19  Yes [provider]  calcium acetate (PHOSLO) 667 MG capsule Take 667-1,334 mg by mouth See admin instructions. Take 1334 mg with each meal and 667 mg with each snack 06/02/18  Yes [provider]  carvedilol (COREG) 25 MG tablet Take 1 tablet (25 mg total) by mouth 2 (two) times daily. 04/23/20  Yes Skeet Latch, MD  cinacalcet (SENSIPAR) 30 MG tablet Take 30 mg by mouth 3 (three) times a week. Mon, Wed, Fri 04/11/20  Yes [provider]  diltiazem (CARDIZEM CD) 120 MG 24 hr capsule Take 1 capsule (120 mg total) by mouth daily. 02/28/20 08/09/20 Yes Skeet Latch, MD  gabapentin (NEURONTIN) 100 MG capsule Take 100 mg by mouth 2 (two) times daily. 10/31/18  Yes [provider]  isosorbide mononitrate (IMDUR) 30 MG 24 hr tablet Take 1 tablet (30 mg total) by mouth daily. 04/24/20 08/09/20 Yes Cleaver, Jossie Ng, NP  nitroGLYCERIN (NITROSTAT) 0.4 MG SL tablet Place 1 tablet (0.4 mg total) under the tongue every 5 (five) minutes as needed for chest pain. 07/26/20  Yes Skeet Latch, MD  ondansetron (ZOFRAN) 4 MG tablet Take 1 tablet (4 mg total) by mouth every 8 (eight) hours as needed for nausea or vomiting. 05/11/93  Yes Delora Fuel, MD  rosuvastatin (CRESTOR) 20 MG tablet Take 1 tablet (20 mg total) by mouth daily. 04/23/20  Yes Skeet Latch, MD  ticagrelor (BRILINTA) 90 MG TABS tablet Take 1 tablet (90 mg total) by mouth 2 (two) times daily.  05/28/20  Yes Skeet Latch, MD  calcitRIOL (ROCALTROL) 0.5 MCG capsule Take 1.5 mcg by mouth daily.  04/29/19   [provider]  thiamine (VITAMIN B-1) 50 MG tablet Take 1 tablet (50 mg total) by mouth daily. Patient not taking: No sig reported 03/01/20   [name], MD     Alcus Dad, MD PGY-2 [place]

## 2020-08-12 NOTE — Progress Notes (Signed)
This Rn obtained verbal informed blood consent from patient spouse Kristine Royal. This Rn answered and informed consent within his scope of practice. This Rn obtained a second verification via [employer]. Who also spoke and verified informed consent from spouse d/t patient medical circumstances. Rn will closely monitor patient.

## 2020-08-12 NOTE — Progress Notes (Signed)
This Rn obtained phone call from MD[place]. MD informed RN that patient to receive Blood transfusion during dialysis today. This Rn will continue to monitor patient closely.

## 2020-08-12 NOTE — Progress Notes (Signed)
Spoke with spouse about getting trialysis catheter in patient for dialysis. Pt's spouse wanted to wait until he calls back before performing procedure.

## 2020-08-12 NOTE — Progress Notes (Signed)
NAME:  Brenda Davis, MRN:  [id number], DOB:  May 18, 1965, LOS: 6 ADMISSION DATE:  07/30/2020, CONSULTATION DATE:  7/21 REFERRING MD:  Johnney Killian, CHIEF COMPLAINT:  Cardiac arrest   History of Present Illness:  Brenda Davis is a 55 y.o. female with a PMH significant for ESRD on dialysis at home 4 days a week , CAD s/p PCI with DES, HTN, HLD, type 2 diabetes, morbid obesity, and anemia who was at an outpatient endoscopy procedure where she suffered a cardiac arrest. Per family she was undergoing a planned colonoscopy and endoscopy for a history of persistent nausea and vomiting. Per report patient had tolerated procedure well until she suddenly suffered a PEA cardiac arrest. Arrest time was estimated at 10 mins, she received 2 round of epi and compressions. Patient was intubated during arrest. Spouse denies any other acute complaints prior to admission.  Pertinent  Medical History  ESRD on HD 4 days/week, CAD s/p PCI with DES, HTN, HLD, T2DM, morbid obesity, anemia  Significant Hospital Events: Including procedures, antibiotic start and stop dates in addition to other pertinent events   7/19: admitted after 10 min PEA arrest during outpatient endoscopy/colonoscopy 7/20: some involuntary movements noted, EEG and MRI obtained 7/21: cardiology consulted, started IV Heparin 7/22 Heparin held due to profuse bleeding from the mouth?  Tongue bite , HD terminated 1.5 hours due to clotting and agitation ,RN reports wiggling toes to command 7/23 Precedex   Antibiotics 7/19 unasyn >   Interim History / Subjective:  Tmax 100.5, currently 101 this morning Remains mechanically ventilated Will open eyes and move lower extremities Some oozing from mouth   Objective   Blood pressure (!) 94/43, pulse 70, temperature (!) 101 F (38.3 C), temperature source Axillary, resp. rate 20, height 5' 5"  (1.651 m), weight 125.9 kg, SpO2 99 %.    Vent Mode: CPAP;PSV FiO2 (%):  [40 %] 40  % Set Rate:  [16 bmp] 16 bmp Vt Set:  [450 mL] 450 mL PEEP:  [5 cmH20] 5 cmH20 Pressure Support:  [8 cmH20-10 cmH20] 8 cmH20 Plateau Pressure:  [19 cmH20-21 cmH20] 21 cmH20   Intake/Output Summary (Last 24 hours) at 08/12/2020 0815 Last data filed at 08/12/2020 0700 Gross per 24 hour  Intake 1444.3 ml  Output 0 ml  Net 1444.3 ml   Filed Weights   08/09/20 0500 08/10/20 0500 08/12/20 0152  Weight: 124.6 kg 125.1 kg 125.9 kg    Examination:   General:  In bed on vent HENT: NCAT ETT in place PULM: CTA B, vent supported breathing CV: RRR, no mgr GI: BS+, soft, nontender MSK: normal bulk and tone Neuro: awake, eyes open, moving feet spontaneously but not purposefully, doesn't follow commands    Resolved Hospital Problem list     Assessment & Plan:  Anoxic encephalopathy; MRI brain and EEG without clear abnormality Minimize sedation Frequent neurologic exams PAD protocol for ventilator synchrony : precedex and prn fentanyl Neurology consult today  Acute hypoxemic respiratory failure Full mechanical vent support VAP prevention Daily WUA Spontaneous breathing trial daily  Aspiration pneumonia Stop unasyn toda  PEA arrest Elevated troponin Ischemic cardiomyopathy with WMA on echo CAD s/p PCI and DES Tele Holding heparin Aspirin, brillinta to continue  Acute blood loss anemia> bleeding from mouth due to tongue laceration; laceration not amenable to suturing Monitor for bleeding Transfuse PRBC for Hgb < 7 gm/dL Need to continue ASA and brillinta Repeat CBC  Hypertension Awake, alert  ESRD on HD Dialysis per renal Monitor  BMET  DM2 Continue tube feeding and SSI/accuchecks   Best Practice (right click and "Reselect all SmartList Selections" daily)   Diet/type: tubefeeds DVT prophylaxis: prophylactic heparin  GI prophylaxis: H2B Lines: N/A Foley:  N/A Code Status:  full code Last date of multidisciplinary goals of care discussion [7/21]  Labs    CBC: Recent Labs  Lab 07/29/2020 1254 08/08/2020 1255 08/09/20 0708 08/09/20 1312 08/10/20 0037 08/11/20 0203 08/12/20 0157  WBC 6.1   < > 12.6* 12.2* 12.2* 10.2 10.1  NEUTROABS 4.2  --   --   --   --   --   --   HGB 10.7*   < > 9.7* 8.8* 8.5* 8.7* 6.6*  HCT 34.2*   < > 30.8* 27.9* 26.0* 26.7* 20.3*  MCV 98.6   < > 96.9 96.5 96.3 96.7 95.3  PLT 243   < > 243 222 217 248 223   < > = values in this interval not displayed.    Basic Metabolic Panel: Recent Labs  Lab 07/24/2020 1254 08/16/2020 1255 08/07/2020 1451 07/19/2020 1706 08/07/20 0447 08/07/20 1647 08/08/20 0142 08/09/20 0708 08/09/20 1312 08/10/20 0037 08/11/20 0203 08/11/20 1642 08/12/20 0157  NA 133*   < >  --   --  133*  --    < > 134* 134* 134* 135 131* 133*  K 5.2*   < >  --   --  4.9  --    < > 4.6 4.7 4.3 5.4* 5.1 4.2  CL 90*   < >  --   --  92*  --    < > 90* 95* 95* 96* 94* 99  CO2 29  --   --   --  25  --    < > 27 26 26 24  21* 19*  GLUCOSE 168*   < >  --   --  130*  --    < > 164* 180* 142* 169* 192* 143*  BUN 28*   < >  --   --  38*  --    < > 57* 65* 45* 75* 92* 92*  CREATININE 7.78*   < > 8.09*  --  8.29*  --    < > 8.17* 8.57* 6.30* 8.37* 9.41* 8.19*  CALCIUM 9.4  --   --   --  9.6  --    < > 8.9 8.5* 8.7* 8.8* 8.4* 7.1*  MG 2.5*  --  2.4 2.4 2.3 2.3  --   --   --   --   --   --   --   PHOS 6.4*  --  5.7* 5.6* 5.1* 6.4*  --  5.8* 6.0* 4.6 6.2*  --  5.7*   < > = values in this interval not displayed.   GFR: Estimated Creatinine Clearance: 10.4 mL/min (A) (by C-G formula based on SCr of 8.19 mg/dL (H)). Recent Labs  Lab 08/12/2020 1254 07/28/2020 1451 07/21/2020 1525 08/07/20 0447 08/09/20 1312 08/10/20 0037 08/11/20 0203 08/12/20 0157  WBC 6.1   < >  --    < > 12.2* 12.2* 10.2 10.1  LATICACIDVEN 3.3*  --  1.4  --   --   --   --   --    < > = values in this interval not displayed.    Liver Function Tests: Recent Labs  Lab 08/02/2020 1254 08/08/20 0142 08/09/20 0708 08/09/20 1312 08/10/20 0037  08/11/20 0203 08/12/20 0157  AST 83* 36  --   --   --   --   --  ALT 78* 45*  --   --   --   --   --   ALKPHOS 72 66  --   --   --   --   --   BILITOT 0.7 0.7  --   --   --   --   --   PROT 7.1 6.2*  --   --   --   --   --   ALBUMIN 3.5 2.8* 3.0* 2.7* 2.6* 2.5* 2.2*   No results for input(s): LIPASE, AMYLASE in the last 168 hours. No results for input(s): AMMONIA in the last 168 hours.  ABG    Component Value Date/Time   PHART 7.443 07/20/2020 1427   PCO2ART 49.2 (H) 08/12/2020 1427   PO2ART 226 (H) 08/05/2020 1427   HCO3 33.9 (H) 08/14/2020 1427   TCO2 35 (H) 07/25/2020 1427   ACIDBASEDEF 1.0 01/18/2019 2358   O2SAT 100.0 08/05/2020 1427     Coagulation Profile: Recent Labs  Lab 07/22/2020 1254  INR 1.2    Cardiac Enzymes: No results for input(s): CKTOTAL, CKMB, CKMBINDEX, TROPONINI in the last 168 hours.  HbA1C: Hemoglobin A1C  Date/Time Value Ref Range Status  07/27/2017 12:00 AM 6.8  Final  05/24/2017 12:00 AM 5.8  Final   Hgb A1c MFr Bld  Date/Time Value Ref Range Status  07/29/2020 12:51 PM 5.5 4.8 - 5.6 % Final    Comment:    (NOTE) Pre diabetes:          5.7%-6.4%  Diabetes:              >6.4%  Glycemic control for   <7.0% adults with diabetes   02/23/2020 09:31 AM 6.3 4.6 - 6.5 % Final    Comment:    Glycemic Control Guidelines for People with Diabetes:Non Diabetic:  <6%Goal of Therapy: <7%Additional Action Suggested:  >8%     CBG: Recent Labs  Lab 08/11/20 1512 08/11/20 2001 08/11/20 2336 08/12/20 0343 08/12/20 0745  GLUCAP 178* 162* 177* 148* 170*   Critical care time: 35 minutes    Roselie Awkward, MD [place] [facility] Pager: [phone] Cell: [phone] After 7:00 pm call Elink  [phone]

## 2020-08-12 NOTE — Consult Note (Addendum)
NEURO HOSPITALIST CONSULT NOTE   Requestig physician: Dr. Lake Bells  Reason for Consult: Persistent AMS following cardiac arrest  History obtained from:  EMS and Chart     HPI:                                                                                                                                          Brenda Davis is an 55 y.o. female with ESRD on HD MWF, CAD s/p NSTEMI, HFpEF, and documented history of cirrhosis.  She presented to [facility] after experiencing PEA arrest while undergoing a diagnostic colonoscopy for persistent n/v at an outside facility. Duration of resuscitation measures prior to ROSC: 10 mins. Initial brain imaging, including CT and MRI were negative for an acute infarct or cytotoxic edema. EEG was negative for epileptiform activity. She had been on sedation with precedex which was turned off around 0630 this morning due to hypotension. Although she was moving all extremities, she remained encephalopathic even after turning sedation off earlier this morning, so neurology was consulted for further evaluation.  Last HD was 08/09/20.   Her daughter, who is present at bedside, denies prior history of seizures.   Her daughter reports that she was living independently at home with her husband prior to this event. She was doing peritoneal dialysis. Prior to the event, the daughter is unaware of any recent illness.  Past Medical History:  Diagnosis Date   Anemia    CAD (coronary artery disease)    a. cath in 02/2018 showing moderate 3-vessel CAD with 45% mid-LADm 55% LCx and 65% RCA stenosis which was not significant by FFR   ESRD on dialysis ([place])    Gout    HCAP (healthcare-associated pneumonia) 05/06/2016   History of blood transfusion    "related to surgery"   Hyperlipidemia    Hypertension    Morbid obesity ([place])    Pain    LEFT SHOULDER PAIN - WAS SEEN AT AN URGENT CARE - GIVEN SLING FOR COMFORT AND TOLD ROM AS TOLERATED.    Palpitations 09/24/2016   Peritonitis, dialysis-associated (HCC)    SVT (supraventricular tachycardia) ([place]) 02/28/2020   Type II diabetes mellitus ([place])    "gastric sleeve OR corrected this" (05/06/2016)    Past Surgical History:  Procedure Laterality Date   AV FISTULA PLACEMENT Left 01/03/2015   Procedure: BRACHIAL CEPHALIC ARTERIOVENOUS  FISTULA CREATION LEFT ARM;  Surgeon: Angelia Mould, MD;  Location: [place];  Service: Vascular;  Laterality: Left;   CARPAL TUNNEL RELEASE Right    [place]  09/14/2017   CORONARY STENT INTERVENTION N/A 01/17/2019   Procedure: CORONARY STENT INTERVENTION;  Surgeon: Jettie Booze, MD;  Location: [place] [facility];  Service: Cardiovascular;  Laterality:  N/A;   ESOPHAGOGASTRODUODENOSCOPY N/A 09/04/2012   Procedure: ESOPHAGOGASTRODUODENOSCOPY (EGD);  Surgeon: Shann Medal, MD;  Location: [facility];  Service: General;  Laterality: N/A;  PF   ESOPHAGOGASTRODUODENOSCOPY (EGD) WITH ESOPHAGEAL DILATION N/A 09/29/2012   Procedure: ESOPHAGOGASTRODUODENOSCOPY (EGD) WITH ESOPHAGEAL DILATION;  Surgeon: Milus Banister, MD;  Location: [facility];  Service: Endoscopy;  Laterality: N/A;   FEMORAL ARTERY EXPLORATION Right 01/17/2019   Procedure: Exploration Right Groin with Primary Closure or Arteriotomy Site; Washout of Retroperitoneal Hematoma;  Surgeon: Waynetta Sandy, MD;  Location: [place];  Service: Vascular;  Laterality: Right;   INSERTION OF DIALYSIS CATHETER N/A 01/03/2015   Procedure: INSERTION OF DIALYSIS CATHETER RIGHT INTERNAL JUGULAR;  Surgeon: Angelia Mould, MD;  Location: [place];  Service: Vascular;  Laterality: N/A;   LAPAROSCOPIC GASTRIC SLEEVE RESECTION N/A 07/19/2012   Procedure: LAPAROSCOPIC SLEEVE GASTRECTOMY with EGD;  Surgeon: Madilyn Hook, DO;  Location: [facility];  Service: General;  Laterality: N/A;  laparoscopic sleeve gastrectomy with EGD   LEFT HEART CATH AND CORONARY  ANGIOGRAPHY N/A 02/25/2018   Procedure: LEFT HEART CATH AND CORONARY ANGIOGRAPHY;  Surgeon: Leonie Man, MD;  Location: [place] [facility];  Service: Cardiovascular;  Laterality: N/A;   LEFT HEART CATH AND CORONARY ANGIOGRAPHY N/A 06/20/2018   Procedure: LEFT HEART CATH AND CORONARY ANGIOGRAPHY;  Surgeon: Nelva Bush, MD;  Location: [place] [facility];  Service: Cardiovascular;  Laterality: N/A;   LEFT HEART CATH AND CORONARY ANGIOGRAPHY N/A 01/16/2019   Procedure: LEFT HEART CATH AND CORONARY ANGIOGRAPHY;  Surgeon: Jettie Booze, MD;  Location: [place] [facility];  Service: Cardiovascular;  Laterality: N/A;   LEFT HEART CATH AND CORONARY ANGIOGRAPHY N/A 03/03/2019   Procedure: LEFT HEART CATH AND CORONARY ANGIOGRAPHY;  Surgeon: Jettie Booze, MD;  Location: [place] [facility];  Service: Cardiovascular;  Laterality: N/A;   PERIPHERAL VASCULAR BALLOON ANGIOPLASTY Left 08/18/2019   Procedure: PERIPHERAL VASCULAR BALLOON ANGIOPLASTY;  Surgeon: Marty Heck, MD;  Location: [place] [facility];  Service: Cardiovascular;  Laterality: Left;  arm fistula   PERIPHERAL VASCULAR CATHETERIZATION Left 05/23/2015   Procedure: Nolon Stalls;  Surgeon: Conrad Goliad, MD;  Location: [place] [facility];  Service: Cardiovascular;  Laterality: Left;  upper aRM   PERIPHERAL VASCULAR CATHETERIZATION Left 05/23/2015   Procedure: Peripheral Vascular Balloon Angioplasty;  Surgeon: Conrad Pitkin, MD;  Location: [place] [facility];  Service: Cardiovascular;  Laterality: Left;  av fistula   TUBAL LIGATION  1993   UPPER GI ENDOSCOPY N/A 07/19/2012   Procedure: UPPER GI ENDOSCOPY;  Surgeon: Madilyn Hook, DO;  Location: [facility];  Service: General;  Laterality: N/A;    Family History  Problem Relation Age of Onset   Hypertension Mother    Mitral valve prolapse Mother    Diabetes Father    Arthritis Other    Hyperlipidemia Other    Cancer Neg Hx    Heart disease Neg Hx    Kidney disease Neg Hx    Stroke  Neg Hx       Social History:  reports that she quit smoking about 19 years ago. Her smoking use included cigarettes. She has a 16.00 pack-year smoking history. She has never used smokeless tobacco. She reports that she does not drink alcohol and does not use drugs.  Allergies  Allergen Reactions   Amlodipine Swelling   Atorvastatin     Elevated LFT's   Clonidine Derivatives Swelling    Limbs swell  Doxycycline Nausea And Vomiting    "I threw up for 3 hours"   Welchol [Colesevelam Hcl] Nausea Only    MEDICATIONS:                                                                                                                     No current facility-administered medications on file prior to encounter.   Current Outpatient Medications on File Prior to Encounter  Medication Sig Dispense Refill   acetaminophen (TYLENOL) 500 MG tablet Take 1,000 mg by mouth every 6 (six) hours as needed for moderate pain or headache.     B Complex-C-Biotin-D-FA (DIALYVITE 800 PLUS D) 800 MCG WAFR Take by mouth.     calcium acetate (PHOSLO) 667 MG capsule Take 667-1,334 mg by mouth See admin instructions. Take 1334 mg with each meal and 667 mg with each snack     carvedilol (COREG) 25 MG tablet Take 1 tablet (25 mg total) by mouth 2 (two) times daily. 180 tablet 3   cinacalcet (SENSIPAR) 30 MG tablet Take 30 mg by mouth 3 (three) times a week. Mon, Wed, Fri     diltiazem (CARDIZEM CD) 120 MG 24 hr capsule Take 1 capsule (120 mg total) by mouth daily. 90 capsule 3   gabapentin (NEURONTIN) 100 MG capsule Take 100 mg by mouth 2 (two) times daily.     isosorbide mononitrate (IMDUR) 30 MG 24 hr tablet Take 1 tablet (30 mg total) by mouth daily. 30 tablet 6   nitroGLYCERIN (NITROSTAT) 0.4 MG SL tablet Place 1 tablet (0.4 mg total) under the tongue every 5 (five) minutes as needed for chest pain. 75 tablet 3   ondansetron (ZOFRAN) 4 MG tablet Take 1 tablet (4 mg total) by mouth every 8 (eight) hours as needed for  nausea or vomiting. 20 tablet 0   rosuvastatin (CRESTOR) 20 MG tablet Take 1 tablet (20 mg total) by mouth daily. 90 tablet 3   ticagrelor (BRILINTA) 90 MG TABS tablet Take 1 tablet (90 mg total) by mouth 2 (two) times daily. 32 tablet 0   calcitRIOL (ROCALTROL) 0.5 MCG capsule Take 1.5 mcg by mouth daily.      thiamine (VITAMIN B-1) 50 MG tablet Take 1 tablet (50 mg total) by mouth daily. (Patient not taking: No sig reported) 90 tablet 1      ROS:  ROS unable to be performed due to acute encephalopathy  Physical Exam                                                                                                 Blood pressure (!) 94/43, pulse 70, temperature (!) 101 F (38.3 C), temperature source Axillary, resp. rate 20, height 5' 5"  (1.651 m), weight 125.9 kg, SpO2 99 %.  General: Morbidly obese, chronically ill-appearing HENT: Head atraumatic, ETT, blood dripping from right side of the mouth Eyes: no scleral icterus, clear drainage bilaterally Cardiovascular- RRR, no lower extremity edema Lungs-intubated, on PRVC.  Abdomen- All 4 quadrants palpated and nontender Extremities- Warm, dry and intact Skin-warm and dry  Neurological Examination Mental Status: Does not respond to verbal stimualtion. Opens eyes to noxious stimuli. Does not track. Does not follow any commands. No attempts to communicate. Thrashing limb movements noted, but no purposeful movement. Will furrow brow to noxious stimuli.  Cranial Nerves: II, III, IV, VI: PERRL, roving EOMs present but does not track. Does not blink to threat on the right. Blinks to threat on the left.  VII: no facial droop VIII: unable to assess IX,X: gag reflex intact XII: unable to assess Motor: Does not follow commands but moves all extremities non-purposefully without asymmetry Sensory: Furrows brow to  noxious stimuli.  Plantar reflexes:  Down-going bilaterally Cerebellar: Unable to be assessed Gait: Unable to be assessed   Lab Results BMP Latest Ref Rng & Units 08/12/2020 08/11/2020 08/11/2020  Glucose 70 - 99 mg/dL 143(H) 192(H) 169(H)  BUN 6 - 20 mg/dL 92(H) 92(H) 75(H)  Creatinine 0.44 - 1.00 mg/dL 8.19(H) 9.41(H) 8.37(H)  BUN/Creat Ratio 9 - 23 - - -  Sodium 135 - 145 mmol/L 133(L) 131(L) 135  Potassium 3.5 - 5.1 mmol/L 4.2 5.1 5.4(H)  Chloride 98 - 111 mmol/L 99 94(L) 96(L)  CO2 22 - 32 mmol/L 19(L) 21(L) 24  Calcium 8.9 - 10.3 mg/dL 7.1(L) 8.4(L) 8.8(L)   Hepatic Function Latest Ref Rng & Units 08/12/2020 08/11/2020 08/10/2020  Total Protein 6.5 - 8.1 g/dL - - -  Albumin 3.5 - 5.0 g/dL 2.2(L) 2.5(L) 2.6(L)  AST 15 - 41 U/L - - -  ALT 0 - 44 U/L - - -  Alk Phosphatase 38 - 126 U/L - - -  Total Bilirubin 0.3 - 1.2 mg/dL - - -  Bilirubin, Direct 0.0 - 0.2 mg/dL - - -   CBC Latest Ref Rng & Units 08/12/2020 08/11/2020 08/10/2020  WBC 4.0 - 10.5 K/uL 10.1 10.2 12.2(H)  Hemoglobin 12.0 - 15.0 g/dL 6.6(LL) 8.7(L) 8.5(L)  Hematocrit 36.0 - 46.0 % 20.3(L) 26.7(L) 26.0(L)  Platelets 150 - 400 K/uL 223 248 217   Imaging  08/07/20 CT head w/o contrast:  No acute findings  08/08/20 brain MRI:  No acute abnormalities. Mild chronic microvascular ischemic disease.  08/12/2020 EEG: profound diffuse encephalopathy. No seizure or epileptiform discharges 08/07/20 EEG: severe diffuse encephalopathy. No seizure or epileptiform discharges  08/07/20 echocardiogram: LVEF 45-50%; LV regional wall motion abnormalities with apical hypokinesis; mild LVH; Grade I diastolic dysfunction Mild RV systolic dysfunction;  mild RV enlargement No major valvular abnormalities  Assessment: 55 year old female with ESRD on HD, HFpEF, CAD s/p NSTEMI, and cirrhosis who presented to [facility] on 08/05/2020 after PEA arrest with 10 minutes of resuscitation efforts prior to ROSC. Her mental status has failed to improve  despite being off of sedation, so Neurology was consulted for further evaluation. -Suspect anoxic brain injury s/p PEA arrest. It is likely that she had poor cardiovascular reserve in the setting of her multiple other comorbidies including anemia and heart failure, which can explain anoxic brain injury despite relatively short resuscitation time prior to ROSC.  -Metabolic causes, including uremia and hepatic encephalopathy could be contributing as well, given the somewhat atypical appearance of her exam for anoxic encephalopathy. It should be noted, however, that anoxia is highly likely to be playing a significant role.  -Last dialysis was 7/22--plan is for HD later today. Liver cirrhosis is also noted on her problem list and abdominal ultrasound from 2020 is consistent with this. Lactulose listed on her med list from her HD center however unclear if she is taking this at home. LFTs were normal on admission. Ammonia has not been checked. May consider hepatic encephalopathy. - EEG x 2 were negative for electrographic seizures, but consistent with severe encephalopathy. - CT and brain MRI are normal    Recommendations: 1. Continue supportive care.  2. Recommend considering resuming Precedex to reduce metabolic demand of her writhing/agitated movements. Periodically hold to reassess neurological status.  3. Consider checking an ammonia level and starting lactulose.  5. HD today  Mitzi Hansen, MD Internal Medicine Resident PGY-3 [facility] Pager: [phone] 08/12/2020 12:38 PM     I have seen and examined the patient. I have formulated the assessment and recommendations. 55 year old female with ESRD on HD, HFpEF, CAD s/p NSTEMI, and cirrhosis who presented to [facility] on 08/10/2020 after PEA arrest with 10 minutes of resuscitation efforts prior to ROSC. Her mental status has failed to improve despite being off of sedation. Overall clinical picture most consistent with  diffuse anoxic brain injury, possibly also with a component of metabolic encephalopathy. Recommendations as above. Electronically signed: Dr. Kerney Elbe

## 2020-08-13 ENCOUNTER — Inpatient Hospital Stay (HOSPITAL_COMMUNITY): Payer: Medicare HMO

## 2020-08-13 DIAGNOSIS — J9601 Acute respiratory failure with hypoxia: Secondary | ICD-10-CM | POA: Diagnosis not present

## 2020-08-13 DIAGNOSIS — I469 Cardiac arrest, cause unspecified: Secondary | ICD-10-CM | POA: Diagnosis not present

## 2020-08-13 DIAGNOSIS — R69 Illness, unspecified: Secondary | ICD-10-CM | POA: Diagnosis not present

## 2020-08-13 LAB — RENAL FUNCTION PANEL
Albumin: 2.1 g/dL — ABNORMAL LOW (ref 3.5–5.0)
Albumin: 2.2 g/dL — ABNORMAL LOW (ref 3.5–5.0)
Anion gap: 13 (ref 5–15)
Anion gap: 9 (ref 5–15)
BUN: 86 mg/dL — ABNORMAL HIGH (ref 6–20)
BUN: 62 mg/dL — ABNORMAL HIGH (ref 6–20)
CO2: 23 mmol/L (ref 22–32)
CO2: 26 mmol/L (ref 22–32)
Calcium: 8.4 mg/dL — ABNORMAL LOW (ref 8.9–10.3)
Calcium: 8.6 mg/dL — ABNORMAL LOW (ref 8.9–10.3)
Chloride: 97 mmol/L — ABNORMAL LOW (ref 98–111)
Chloride: 99 mmol/L (ref 98–111)
Creatinine, Ser: 6.77 mg/dL — ABNORMAL HIGH (ref 0.44–1.00)
Creatinine, Ser: 4.87 mg/dL — ABNORMAL HIGH (ref 0.44–1.00)
GFR, Estimated: 7 mL/min — ABNORMAL LOW (ref >60)
GFR, Estimated: 10 mL/min — ABNORMAL LOW (ref >60)
Glucose, Bld: 256 mg/dL — ABNORMAL HIGH (ref 70–99)
Glucose, Bld: 171 mg/dL — ABNORMAL HIGH (ref 70–99)
Phosphorus: 5.5 mg/dL — ABNORMAL HIGH (ref 2.5–4.6)
Phosphorus: 4.6 mg/dL (ref 2.5–4.6)
Potassium: 4.8 mmol/L (ref 3.5–5.1)
Potassium: 4.9 mmol/L (ref 3.5–5.1)
Sodium: 133 mmol/L — ABNORMAL LOW (ref 135–145)
Sodium: 134 mmol/L — ABNORMAL LOW (ref 135–145)

## 2020-08-13 LAB — MAGNESIUM: Magnesium: 2.6 mg/dL — ABNORMAL HIGH (ref 1.7–2.4)

## 2020-08-13 LAB — CBC
HCT: 24.2 % — ABNORMAL LOW (ref 36.0–46.0)
Hemoglobin: 7.8 g/dL — ABNORMAL LOW (ref 12.0–15.0)
MCH: 30.2 pg (ref 26.0–34.0)
MCHC: 32.2 g/dL (ref 30.0–36.0)
MCV: 93.8 fL (ref 80.0–100.0)
Platelets: 225 10*3/uL (ref 150–400)
RBC: 2.58 MIL/uL — ABNORMAL LOW (ref 3.87–5.11)
RDW: 16.2 % — ABNORMAL HIGH (ref 11.5–15.5)
WBC: 12.4 10*3/uL — ABNORMAL HIGH (ref 4.0–10.5)
nRBC: 0 % (ref 0.0–0.2)

## 2020-08-13 LAB — TYPE AND SCREEN
ABO/RH(D): B POS
Antibody Screen: NEGATIVE
Unit division: 0

## 2020-08-13 LAB — GLUCOSE, CAPILLARY
Glucose-Capillary: 248 mg/dL — ABNORMAL HIGH (ref 70–99)
Glucose-Capillary: 211 mg/dL — ABNORMAL HIGH (ref 70–99)
Glucose-Capillary: 189 mg/dL — ABNORMAL HIGH (ref 70–99)
Glucose-Capillary: 153 mg/dL — ABNORMAL HIGH (ref 70–99)
Glucose-Capillary: 155 mg/dL — ABNORMAL HIGH (ref 70–99)
Glucose-Capillary: 189 mg/dL — ABNORMAL HIGH (ref 70–99)

## 2020-08-13 LAB — BPAM RBC
Blood Product Expiration Date: 202208112359
ISSUE DATE / TIME: 202207251300
Unit Type and Rh: 7300

## 2020-08-13 LAB — IRON AND TIBC
Iron: 25 ug/dL — ABNORMAL LOW (ref 28–170)
Saturation Ratios: 13 % (ref 10.4–31.8)
TIBC: 196 ug/dL — ABNORMAL LOW (ref 250–450)
UIBC: 171 ug/dL

## 2020-08-13 LAB — AMMONIA: Ammonia: 38 umol/L — ABNORMAL HIGH (ref 9–35)

## 2020-08-13 LAB — APTT: aPTT: 50 seconds — ABNORMAL HIGH (ref 24–36)

## 2020-08-13 LAB — LACTIC ACID, PLASMA: Lactic Acid, Venous: 0.7 mmol/L (ref 0.5–1.9)

## 2020-08-13 MED ORDER — MIDODRINE HCL 5 MG PO TABS
5.0000 mg | ORAL_TABLET | Freq: Once | ORAL | Status: AC
  Administered 2020-08-13: 5 mg
  Filled 2020-08-13: qty 1

## 2020-08-13 MED ORDER — INSULIN ASPART 100 UNIT/ML IJ SOLN
5.0000 [IU] | INTRAMUSCULAR | Status: DC
  Administered 2020-08-13 – 2020-08-15 (×11): 5 [IU] via SUBCUTANEOUS

## 2020-08-13 MED ORDER — PROSOURCE TF PO LIQD
45.0000 mL | Freq: Every day | ORAL | Status: DC
  Administered 2020-08-13 – 2020-08-14 (×4): 45 mL
  Filled 2020-08-13 (×4): qty 45

## 2020-08-13 MED ORDER — MIDODRINE HCL 5 MG PO TABS
10.0000 mg | ORAL_TABLET | Freq: Three times a day (TID) | ORAL | Status: DC
  Administered 2020-08-13 – 2020-08-15 (×7): 10 mg
  Filled 2020-08-13 (×7): qty 2

## 2020-08-13 MED ORDER — SODIUM CHLORIDE 0.9% FLUSH: 10.0000 mL | INTRAVENOUS | Status: DC | PRN

## 2020-08-13 MED ORDER — VITAL 1.5 CAL PO LIQD
1000.0000 mL | ORAL | Status: DC
  Administered 2020-08-14: 1000 mL

## 2020-08-13 MED ORDER — SODIUM CHLORIDE 0.9% FLUSH
10.0000 mL | Freq: Two times a day (BID) | INTRAVENOUS | Status: DC
  Administered 2020-08-13 – 2020-08-15 (×5): 10 mL

## 2020-08-13 NOTE — Progress Notes (Signed)
Attending:    Subjective: Started on CRRT overnight Hypothermic overnight, started on a warming blanket She is on a low dose of levophed Tried spontaneous breathing trial this morning. Failed due to tachypnea Not following commands Still has some bleeding from tongue  Objective: Vitals:   08/13/20 0737 08/13/20 0800 08/13/20 0900 08/13/20 1000  BP: (!) 112/53 (!) 102/91 (!) 91/47 (!) 98/43  Pulse: 63 (!) 50 (!) 53 (!) 59  Resp: 17 17 18 18   Temp:      TempSrc:      SpO2: 100% 100% 100% 100%  Weight:      Height:       Vent Mode: PRVC FiO2 (%):  [40 %] 40 % Set Rate:  [16 bmp] 16 bmp Vt Set:  [450 mL] 450 mL PEEP:  [5 cmH20] 5 cmH20 Plateau Pressure:  [18 cmH20-20 cmH20] 20 cmH20  Intake/Output Summary (Last 24 hours) at 08/13/2020 1020 Last data filed at 08/13/2020 1000 Gross per 24 hour  Intake 2099.69 ml  Output 1091 ml  Net 1008.69 ml    General:  In bed on vent HENT: NCAT ETT in place PULM: CTA B, vent supported breathing CV: RRR, no mgr GI: BS+, soft, nontender MSK: normal bulk and tone Neuro: sedated on vent    CBC    Component Value Date/Time   WBC 12.4 (H) 08/13/2020 0329   RBC 2.58 (L) 08/13/2020 0329   HGB 7.8 (L) 08/13/2020 0329   HGB 8.1 (L) 02/28/2020 1047   HCT 24.2 (L) 08/13/2020 0329   HCT 25.0 (L) 02/28/2020 1047   PLT 225 08/13/2020 0329   PLT 218 02/28/2020 1047   MCV 93.8 08/13/2020 0329   MCV 98 (H) 02/28/2020 1047   MCH 30.2 08/13/2020 0329   MCHC 32.2 08/13/2020 0329   RDW 16.2 (H) 08/13/2020 0329   RDW 15.0 02/28/2020 1047   LYMPHSABS 1.4 08/16/2020 1254   LYMPHSABS 1.1 02/28/2020 1047   MONOABS 0.3 08/12/2020 1254   EOSABS 0.1 07/28/2020 1254   EOSABS 0.0 02/28/2020 1047   BASOSABS 0.0 08/05/2020 1254   BASOSABS 0.0 02/28/2020 1047    BMET    Component Value Date/Time   NA 133 (L) 08/13/2020 0313   NA 139 02/28/2020 1047   K 4.8 08/13/2020 0313   CL 97 (L) 08/13/2020 0313   CO2 23 08/13/2020 0313   GLUCOSE 256  (H) 08/13/2020 0313   BUN 86 (H) 08/13/2020 0313   BUN 29 (H) 02/28/2020 1047   CREATININE 6.77 (H) 08/13/2020 0313   CREATININE 2.28 (H) 03/21/2012 0952   CALCIUM 8.4 (L) 08/13/2020 0313   GFRNONAA 7 (L) 08/13/2020 0313   GFRAA 6 (L) 02/28/2020 1047    CXR images personally reviewed ETT in place, R IJ HD cath  Impression/Plan: Acute metabolic encephalopathy> picture consistent with anoxic brain injury without clear prognosis; wean off precedex; will discuss tracheostomy with family PEA arrest, known CAD> continue asa, brillinta, statin, holding off on left heart cath until mental status improves Tongue bleeding. Monitor Hypotension, chronic > per husband she has this at hemodialysis and requires midodrine there; will add midodrine here and will continue CRRT for now and continue levophed for MAP > 65 Acute respiratory failure with hypoxemia> given overall picture (failing SBT, poor mental status and airway protection) will need a tracheostomy if her husband agrees.   My cc time 35 minutes  Roselie Awkward, MD [place] [facility] Pager: [phone] Cell: [phone] After 7pm: [phone]

## 2020-08-13 NOTE — Progress Notes (Signed)
Inpatient Diabetes Program Recommendations  AACE/ADA: New Consensus Statement on Inpatient Glycemic Control  Target Ranges:  Prepandial:   less than 140 mg/dL      Peak postprandial:   less than 180 mg/dL (1-2 hours)      Critically ill patients:  140 - 180 mg/dL   Results for [name], [name] (MRN [id number]) as of 08/13/2020 11:33  Ref. Range 08/12/2020 07:45 08/12/2020 11:07 08/12/2020 15:47 08/12/2020 19:34 08/12/2020 23:43 08/13/2020 03:23 08/13/2020 07:27 08/13/2020 11:17  Glucose-Capillary Latest Ref Range: 70 - 99 mg/dL 170 (H) 139 (H) 181 (H) 190 (H) 232 (H) 248 (H) 211 (H) 189 (H)    Review of Glycemic Control  Current orders for Inpatient glycemic control: Novolog 0-15 units Q4H; Vital @ 40 ml/hr  Inpatient Diabetes Program Recommendations:    Insulin: Please consider ordering Novolog 3 units Q4H for tube feeding coverage. If tube feeding is stopped or held then Novolog tube feeding coverage should also be stopped or held.  Thanks, Barnie Alderman, RN, MSN, CDE Diabetes Coordinator Inpatient Diabetes Program [phone] (Team Pager from 8am to 5pm)

## 2020-08-13 NOTE — Progress Notes (Signed)
Nutrition Follow-up  DOCUMENTATION CODES:   Morbid obesity  INTERVENTION:   Continue tube feeding via OG tube: Vital 1.5 increase goal rate to 50 ml/h (1200 ml per day) Prosource TF 45 ml 5 times per day  Provides 2000 kcal, 136 gm protein, 917 ml free water daily  NUTRITION DIAGNOSIS:   Inadequate oral intake related to inability to eat as evidenced by NPO status.  Ongoing  GOAL:   Provide needs based on ASPEN/SCCM guidelines  Met with TF  MONITOR:   Vent status, Labs, TF tolerance  REASON FOR ASSESSMENT:   Ventilator, Consult Enteral/tube feeding initiation and management  ASSESSMENT:   55 yo female admitted S/P PEA cardiac arrest during outpatient endoscopy procedure. PMH includes ESRD on HD at home 4 days per week, CAD, HTN, HLD, DM-2.  Discussed patient in ICU rounds and with RN today. Patient received blood transfusion 6/25.   Started on CRRT last night. Not following commands. Medical team is discussing goals of care with family. Palliative Care team has been consulted.  OG tube in place, receiving Vital 1.5 at 40 ml/h with prosource TF 90 ml TID. Tolerating without difficulty.  Patient remains intubated on ventilator support MV: 8.2 L/min Temp (24hrs), Avg:97.1 F (36.2 C), Min:93.4 F (34.1 C), Max:99.5 F (37.5 C) MAP range 64-78 since 6:00 this morning  Labs reviewed. Na 133, BUN 86, creatinine 6.77, phos 5.5, mag 2.6 CBG: 248-211-189  Medications reviewed and include precedex, levophed, aranesp, pepcid, novolog, rena-vit.  EDW 124 kg Currently 124 kg  I/O +6.5 L since admission  Diet Order:   Diet Order             Diet NPO time specified  Diet effective now                   EDUCATION NEEDS:   Not appropriate for education at this time  Skin:  Skin Assessment: Reviewed RN Assessment  Last BM:  7/25 type 7  Height:   Ht Readings from Last 1 Encounters:  07/29/2020 5' 5"  (1.651 m)    Weight:   Wt Readings from Last  1 Encounters:  08/13/20 124 kg    Ideal Body Weight:  56.8 kg  BMI:  Body mass index is 45.49 kg/m.  Estimated Nutritional Needs:   Kcal:  1870-2070  Protein:  120-140 gm  Fluid:  >/= 1.8 L    Lucas Mallow, RD, LDN, CNSC Please refer to Amion for contact information.

## 2020-08-13 NOTE — Progress Notes (Signed)
RT NOTE: attempted SBT on patient this AM on CPAP/PSV of 12/5.  Patient began to have a low minute ventilation.  Placed back on full support settings and is tolerating well.  Will continue to monitor.

## 2020-08-13 NOTE — Progress Notes (Signed)
NAME:  Brenda Davis, MRN:  [id number], DOB:  02-11-1965, LOS: 7 ADMISSION DATE:  08/11/2020, CONSULTATION DATE:  08/16/2020 REFERRING MD:  Johnney Killian, CHIEF COMPLAINT:  Cardiac Arrest   History of Present Illness:  Brenda Davis is a 55 y.o. female with a PMH significant for ESRD on dialysis at home 4 days a week, CAD s/p PCI with DES, HTN, HLD, type 2 diabetes, morbid obesity, and anemia who was at an outpatient endoscopy procedure where she suffered a cardiac arrest. Per family she was undergoing a planned colonoscopy and endoscopy for a history of persistent nausea and vomiting. Per report patient had tolerated procedure well until she suddenly suffered a PEA cardiac arrest. Arrest time was estimated at 10 mins, she received 2 round of epi and compressions. Patient was intubated during arrest. Spouse denies any other acute complaints prior to admission.  Pertinent  Medical History  ESRD on HD 4 days/week, CAD s/p PCI with DES, HTN, HLD, T2DM, morbid obesity, anemia  Significant Hospital Events: Including procedures, antibiotic start and stop dates in addition to other pertinent events   7/19: admitted after 10 min PEA arrest during outpatient endoscopy/colonoscopy 7/20: some involuntary movements noted, EEG and MRI obtained 7/21: cardiology consulted, started IV Heparin 7/22: Heparin held due to profuse bleeding from the mouth, ?tongue bite , HD terminated 1.5 hours due to clotting and agitation, RN reported wiggling toes to command 7/23: Precedex started for agitation 7/25: neuro consulted, central venous catheter placed and CRRT started  Antibiotics Unasyn 7/19-7/25  Interim History / Subjective:  Patient's temp dropped to 93.2F overnight, so she was placed on the bairhugger. CRRT also started overnight. Failed SBT this morning due to low minute ventilation. Patient unable to provide any subjective history. Currently on low dose precedex (0.13mg/kg/hr) and levo  511m/min  Objective   Blood pressure 103/68, pulse (!) 51, temperature (!) 93.4 F (34.1 C), temperature source Axillary, resp. rate 17, height 5' 5"  (1.651 m), weight 124 kg, SpO2 100 %.    Vent Mode: PRVC FiO2 (%):  [40 %] 40 % Set Rate:  [16 bmp] 16 bmp Vt Set:  [450 mL] 450 mL PEEP:  [5 cmH20] 5 cmH20 Pressure Support:  [8 cmH20] 8 cmH20 Plateau Pressure:  [18 cmH20-20 cmH20] 20 cmH20   Intake/Output Summary (Last 24 hours) at 08/13/2020 [id number] data filed at 08/13/2020 0700 Gross per 24 hour  Intake 1717.76 ml  Output 897 ml  Net 820.76 ml   Filed Weights   08/10/20 0500 08/12/20 0152 08/13/20 0500  Weight: 125.1 kg 125.9 kg 124 kg    Examination: General: obese, NAD HENT: ETT in place, tongue bite noted on distal tip of tongue Lungs: normal effort, CTAB Cardiovascular: RRR, normal S1/S2 without m/r/g Abdomen: +BS, soft, nontender Extremities: warm and dry Neuro: Opens eyes/grimaces to painful stimuli only, does not follow any commands, no purposeful movement noted  Resolved Hospital Problem list   Lactic acidosis Elevated Troponin  Assessment & Plan:  Anoxic encephalopathy MRI brain and EEG without clear abnormality. Neuro evaluated yesterday and felt clinical picture consistent with anoxic brain injury. Neuro status unchanged today. P: -Wean off Precedex -Monitor mental status -Palliative consult -Discuss tracheostomy with family   Hypotension Currently on Levophed at 30m98mmin. Was reportedly getting Midodrine on dialysis days at home. P: -Midodrine 86m81mD -Wean levo as tolerated, goal MAP>65  Acute Hypoxic Respiratory Failure Aspiration PNA Remains on full vent support.  P: -s/p 7 day course of Unasyn -Continue vent  support -Daily SBT -Discuss trach with family   Anemia, Oral bleeding from tongue bite wound Hgb 7.8 this morning s/p 1u pRBCs yesterday P: -Monitor CBC -Continuing Brilinta and ASA   PEA arrest Elevated troponin WMA on  echo H/o CAD s/p PCI and DES P: -Cardiology following, appreciate recs -Continue Brilinta, ASA, statin -Previously on Heparin gtt, planned for 48hrs but d/c'd early due to bleeding from tongue bite -Per cards, repeat echo vs cath pending neuro recovery   ESRD CRRT initiated overnight. P: -Continue CRRT -Nephro following, appreciate recs   T2DM CBGs 139-256 over past 24h. Required 21u Aspart yesterday. P: -SSI -CBG monitoring -Consider additional tube feed coverage  Hypothermia Improving w/bair hugger P: -Monitor temp -Continue warming blanket  Best Practice (right click and "Reselect all SmartList Selections" daily)   Diet/type: tubefeeds DVT prophylaxis: prophylactic heparin  GI prophylaxis: PPI Lines: Dialysis Catheter and Arterial Line Foley:  N/A Code Status:  full code Last date of multidisciplinary goals of care discussion [7/21]  Labs   CBC: Recent Labs  Lab 08/10/2020 1254 07/19/2020 1255 08/10/20 0037 08/11/20 0203 08/12/20 0157 08/12/20 1728 08/13/20 0329  WBC 6.1   < > 12.2* 10.2 10.1 13.7* 12.4*  NEUTROABS 4.2  --   --   --   --   --   --   HGB 10.7*   < > 8.5* 8.7* 6.6* 7.6* 7.8*  HCT 34.2*   < > 26.0* 26.7* 20.3* 22.8* 24.2*  MCV 98.6   < > 96.3 96.7 95.3 93.1 93.8  PLT 243   < > 217 248 223 233 225   < > = values in this interval not displayed.    Basic Metabolic Panel: Recent Labs  Lab 08/04/2020 1451 08/03/2020 1706 08/07/20 0447 08/07/20 1647 08/08/20 0142 08/09/20 1312 08/10/20 0037 08/11/20 0203 08/11/20 1642 08/12/20 0157 08/13/20 0313 08/13/20 0329  NA  --   --  133*  --    < > 134* 134* 135 131* 133* 133*  --   K  --   --  4.9  --    < > 4.7 4.3 5.4* 5.1 4.2 4.8  --   CL  --   --  92*  --    < > 95* 95* 96* 94* 99 97*  --   CO2  --   --  25  --    < > 26 26 24  21* 19* 23  --   GLUCOSE  --   --  130*  --    < > 180* 142* 169* 192* 143* 256*  --   BUN  --   --  38*  --    < > 65* 45* 75* 92* 92* 86*  --   CREATININE 8.09*  --   8.29*  --    < > 8.57* 6.30* 8.37* 9.41* 8.19* 6.77*  --   CALCIUM  --   --  9.6  --    < > 8.5* 8.7* 8.8* 8.4* 7.1* 8.4*  --   MG 2.4 2.4 2.3 2.3  --   --   --   --   --   --   --  2.6*  PHOS 5.7* 5.6* 5.1* 6.4*   < > 6.0* 4.6 6.2*  --  5.7* 5.5*  --    < > = values in this interval not displayed.   GFR: Estimated Creatinine Clearance: 12.4 mL/min (A) (by C-G formula based on SCr of 6.77 mg/dL (H)). Recent  Labs  Lab 08/04/2020 1254 07/21/2020 1451 08/04/2020 1525 08/07/20 0447 08/11/20 0203 08/12/20 0157 08/12/20 1728 08/12/20 2309 08/13/20 0329  WBC 6.1   < >  --    < > 10.2 10.1 13.7*  --  12.4*  LATICACIDVEN 3.3*  --  1.4  --   --   --  0.7 0.7  --    < > = values in this interval not displayed.    Liver Function Tests: Recent Labs  Lab 08/13/2020 1254 08/08/20 0142 08/09/20 0708 08/09/20 1312 08/10/20 0037 08/11/20 0203 08/12/20 0157 08/13/20 0313  AST 83* 36  --   --   --   --   --   --   ALT 78* 45*  --   --   --   --   --   --   ALKPHOS 72 66  --   --   --   --   --   --   BILITOT 0.7 0.7  --   --   --   --   --   --   PROT 7.1 6.2*  --   --   --   --   --   --   ALBUMIN 3.5 2.8*   < > 2.7* 2.6* 2.5* 2.2* 2.1*   < > = values in this interval not displayed.   No results for input(s): LIPASE, AMYLASE in the last 168 hours. No results for input(s): AMMONIA in the last 168 hours.  ABG    Component Value Date/Time   PHART 7.443 08/01/2020 1427   PCO2ART 49.2 (H) 08/18/2020 1427   PO2ART 226 (H) 08/18/2020 1427   HCO3 33.9 (H) 08/17/2020 1427   TCO2 35 (H) 08/05/2020 1427   ACIDBASEDEF 1.0 01/18/2019 2358   O2SAT 100.0 08/03/2020 1427     Coagulation Profile: Recent Labs  Lab 07/21/2020 1254  INR 1.2    Cardiac Enzymes: No results for input(s): CKTOTAL, CKMB, CKMBINDEX, TROPONINI in the last 168 hours.  HbA1C: Hemoglobin A1C  Date/Time Value Ref Range Status  07/27/2017 12:00 AM 6.8  Final  05/24/2017 12:00 AM 5.8  Final   Hgb A1c MFr Bld  Date/Time  Value Ref Range Status  07/24/2020 12:51 PM 5.5 4.8 - 5.6 % Final    Comment:    (NOTE) Pre diabetes:          5.7%-6.4%  Diabetes:              >6.4%  Glycemic control for   <7.0% adults with diabetes   02/23/2020 09:31 AM 6.3 4.6 - 6.5 % Final    Comment:    Glycemic Control Guidelines for People with Diabetes:Non Diabetic:  <6%Goal of Therapy: <7%Additional Action Suggested:  >8%     CBG: Recent Labs  Lab 08/12/20 1107 08/12/20 1547 08/12/20 1934 08/12/20 2343 08/13/20 0323  GLUCAP 139* 181* 190* 232* 248*    Past Medical History:  She,  has a past medical history of Anemia, CAD (coronary artery disease), ESRD on dialysis ([place]), Gout, HCAP (healthcare-associated pneumonia) (05/06/2016), History of blood transfusion, Hyperlipidemia, Hypertension, Morbid obesity ([place]), Pain, Palpitations (09/24/2016), Peritonitis, dialysis-associated ([place]), SVT (supraventricular tachycardia) ([place]) (02/28/2020), and Type II diabetes mellitus ([place]).   Surgical History:   Past Surgical History:  Procedure Laterality Date   AV FISTULA PLACEMENT Left 01/03/2015   Procedure: BRACHIAL CEPHALIC ARTERIOVENOUS  FISTULA CREATION LEFT ARM;  Surgeon: Angelia Mould, MD;  Location: [place];  Service: Vascular;  Laterality: Left;  CARPAL TUNNEL RELEASE Right    CESAREAN SECTION  1988, 1993   CHOLECYSTECTOMY  09/14/2017   CORONARY STENT INTERVENTION N/A 01/17/2019   Procedure: CORONARY STENT INTERVENTION;  Surgeon: Jettie Booze, MD;  Location: [place] [facility];  Service: Cardiovascular;  Laterality: N/A;   ESOPHAGOGASTRODUODENOSCOPY N/A 09/04/2012   Procedure: ESOPHAGOGASTRODUODENOSCOPY (EGD);  Surgeon: Shann Medal, MD;  Location: [facility];  Service: General;  Laterality: N/A;  PF   ESOPHAGOGASTRODUODENOSCOPY (EGD) WITH ESOPHAGEAL DILATION N/A 09/29/2012   Procedure: ESOPHAGOGASTRODUODENOSCOPY (EGD) WITH ESOPHAGEAL DILATION;  Surgeon: Milus Banister, MD;  Location: [facility];   Service: Endoscopy;  Laterality: N/A;   FEMORAL ARTERY EXPLORATION Right 01/17/2019   Procedure: Exploration Right Groin with Primary Closure or Arteriotomy Site; Washout of Retroperitoneal Hematoma;  Surgeon: Waynetta Sandy, MD;  Location: [place] [facility];  Service: Vascular;  Laterality: Right;   INSERTION OF DIALYSIS CATHETER N/A 01/03/2015   Procedure: INSERTION OF DIALYSIS CATHETER RIGHT INTERNAL JUGULAR;  Surgeon: Angelia Mould, MD;  Location: [facility];  Service: Vascular;  Laterality: N/A;   LAPAROSCOPIC GASTRIC SLEEVE RESECTION N/A 07/19/2012   Procedure: LAPAROSCOPIC SLEEVE GASTRECTOMY with EGD;  Surgeon: Madilyn Hook, DO;  Location: [facility];  Service: General;  Laterality: N/A;  laparoscopic sleeve gastrectomy with EGD   LEFT HEART CATH AND CORONARY ANGIOGRAPHY N/A 02/25/2018   Procedure: LEFT HEART CATH AND CORONARY ANGIOGRAPHY;  Surgeon: Leonie Man, MD;  Location: [facility];  Service: Cardiovascular;  Laterality: N/A;   LEFT HEART CATH AND CORONARY ANGIOGRAPHY N/A 06/20/2018   Procedure: LEFT HEART CATH AND CORONARY ANGIOGRAPHY;  Surgeon: Nelva Bush, MD;  Location: [place] [facility];  Service: Cardiovascular;  Laterality: N/A;   LEFT HEART CATH AND CORONARY ANGIOGRAPHY N/A 01/16/2019   Procedure: LEFT HEART CATH AND CORONARY ANGIOGRAPHY;  Surgeon: Jettie Booze, MD;  Location: [place];  Service: Cardiovascular;  Laterality: N/A;   LEFT HEART CATH AND CORONARY ANGIOGRAPHY N/A 03/03/2019   Procedure: LEFT HEART CATH AND CORONARY ANGIOGRAPHY;  Surgeon: Jettie Booze, MD;  Location: [facility];  Service: Cardiovascular;  Laterality: N/A;   PERIPHERAL VASCULAR BALLOON ANGIOPLASTY Left 08/18/2019   Procedure: PERIPHERAL VASCULAR BALLOON ANGIOPLASTY;  Surgeon: Marty Heck, MD;  Location: [facility];  Service: Cardiovascular;  Laterality: Left;  arm fistula   PERIPHERAL VASCULAR CATHETERIZATION Left 05/23/2015   Procedure:  Nolon Stalls;  Surgeon: Conrad [place], MD;  Location: [facility] [facility];  Service: Cardiovascular;  Laterality: Left;  upper aRM   PERIPHERAL VASCULAR CATHETERIZATION Left 05/23/2015   Procedure: Peripheral Vascular Balloon Angioplasty;  Surgeon: Conrad [place], MD;  Location: [place];  Service: Cardiovascular;  Laterality: Left;  av fistula   TUBAL LIGATION  1993   UPPER GI ENDOSCOPY N/A 07/19/2012   Procedure: UPPER GI ENDOSCOPY;  Surgeon: Madilyn Hook, DO;  Location: [facility];  Service: General;  Laterality: N/A;     Social History:   reports that she quit smoking about 19 years ago. Her smoking use included cigarettes. She has a 16.00 pack-year smoking history. She has never used smokeless tobacco. She reports that she does not drink alcohol and does not use drugs.   Family History:  Her family history includes Arthritis in an other family member; Diabetes in her father; Hyperlipidemia in an other family member; Hypertension in her mother; Mitral valve prolapse in her mother. There is no history of Cancer, Heart disease, Kidney disease, or Stroke.  Allergies Allergies  Allergen Reactions   Amlodipine Swelling   Atorvastatin     Elevated LFT's   Clonidine Derivatives Swelling    Limbs swell   Doxycycline Nausea And Vomiting    "I threw up for 3 hours"   Welchol [Colesevelam Hcl] Nausea Only     Home Medications  Prior to Admission medications   Medication Sig Start Date End Date Taking? Authorizing Provider  acetaminophen (TYLENOL) 500 MG tablet Take 1,000 mg by mouth every 6 (six) hours as needed for moderate pain or headache.   Yes [provider]  B Complex-C-Biotin-D-FA (DIALYVITE 800 PLUS D) 800 MCG WAFR Take by mouth. 09/04/19  Yes [provider]  calcium acetate (PHOSLO) 667 MG capsule Take 667-1,334 mg by mouth See admin instructions. Take 1334 mg with each meal and 667 mg with each snack 06/02/18  Yes [provider]  carvedilol (COREG) 25 MG  tablet Take 1 tablet (25 mg total) by mouth 2 (two) times daily. 04/23/20  Yes Skeet Latch, MD  cinacalcet (SENSIPAR) 30 MG tablet Take 30 mg by mouth 3 (three) times a week. Mon, Wed, Fri 04/11/20  Yes [provider]  diltiazem (CARDIZEM CD) 120 MG 24 hr capsule Take 1 capsule (120 mg total) by mouth daily. 02/28/20 08/09/20 Yes Skeet Latch, MD  gabapentin (NEURONTIN) 100 MG capsule Take 100 mg by mouth 2 (two) times daily. 10/31/18  Yes [provider]  isosorbide mononitrate (IMDUR) 30 MG 24 hr tablet Take 1 tablet (30 mg total) by mouth daily. 04/24/20 08/09/20 Yes Cleaver, Jossie Ng, NP  nitroGLYCERIN (NITROSTAT) 0.4 MG SL tablet Place 1 tablet (0.4 mg total) under the tongue every 5 (five) minutes as needed for chest pain. 07/26/20  Yes Skeet Latch, MD  ondansetron (ZOFRAN) 4 MG tablet Take 1 tablet (4 mg total) by mouth every 8 (eight) hours as needed for nausea or vomiting. 9/67/59  Yes Delora Fuel, MD  rosuvastatin (CRESTOR) 20 MG tablet Take 1 tablet (20 mg total) by mouth daily. 04/23/20  Yes Skeet Latch, MD  ticagrelor (BRILINTA) 90 MG TABS tablet Take 1 tablet (90 mg total) by mouth 2 (two) times daily. 05/28/20  Yes Skeet Latch, MD  calcitRIOL (ROCALTROL) 0.5 MCG capsule Take 1.5 mcg by mouth daily.  04/29/19   [provider]  thiamine (VITAMIN B-1) 50 MG tablet Take 1 tablet (50 mg total) by mouth daily. Patient not taking: No sig reported 03/01/20   [name], MD     Critical care time: see attending attestation      Alcus Dad, MD PGY-2 [place]

## 2020-08-13 NOTE — Progress Notes (Signed)
Patient ID: Brenda Davis, female   DOB: 27-Oct-1965, 55 y.o.   MRN: [id number] S: BP's in 70's yesterday and had to go on pressor support and CRRT.  Overnight pressors coming down.  Patient also today is more responsive.   O:BP (!) 105/47   Pulse 64   Temp 97.9 F (36.6 C) (Oral)   Resp (!) 24   Ht _0  (1.651 m)   Wt 124 kg   SpO2 99%   BMI 45.49 kg/m   Intake/Output Summary (Last 24 hours) at 08/13/2020 1532 Last data filed at 08/13/2020 1500 Gross per 24 hour  Intake 1722.92 ml  Output 1575 ml  Net 147.92 ml    Intake/Output: I/O last 3 completed shifts: In: 2419.6 [I.V.:957.9; Blood:281.3; NG/GT:819; IV Piggyback:361.5] Out: 841 [Other:897]  Intake/Output this shift:  Total I/O In: 809.8 [I.V.:337; NG/GT:472.8] Out: 678 [Other:678] Weight change: -1.9 kg YSA:[id number] CVS:RRR Resp: ventilated BS bilaterally Abd: +BS, soft, NT/ND Ext: no edema, LUE AVF +T/B    Iron Studies:  Recent Labs    08/13/20 0329  IRON 25*  TIBC 196*   Studies/Results: DG Chest Port 1 View  Result Date: 08/13/2020 CLINICAL DATA:  Acute respiratory failure. EXAM: PORTABLE CHEST 1 VIEW COMPARISON:  08/12/2020. FINDINGS: Endotracheal tube, NG tube, right IJ line in stable position. Cardiomegaly again noted. Low lung volumes again noted. Bilateral pulmonary infiltrates/edema with slight interim progression. Small left pleural effusion. No pneumothorax. IMPRESSION: 1.  Lines and tubes in stable position. 2.  Cardiomegaly again noted. 3. Low lung volumes. Bilateral pulmonary infiltrates/edema noted with slight interim progression from prior exam. Small left pleural effusion. Electronically Signed   By: Marcello Moores  Register   On: 08/13/2020 07:20   DG CHEST PORT 1 VIEW  Result Date: 08/12/2020 CLINICAL DATA:  Central line placement EXAM: PORTABLE CHEST 1 VIEW COMPARISON:  Radiograph 08/11/2020, CT 03/12/2019 FINDINGS: Endotracheal tube overlies the upper thoracic trachea. There is a  nasogastric tube which passes below the diaphragm, tip excluded by collimation. There is a new right hip roach central venous catheter with tip overlying the distal superior vena cava. Unchanged, enlarged cardiomediastinal silhouette. Unchanged diffuse interstitial and basilar predominant airspace opacities. No visible pneumothorax. No large pleural effusion. No acute osseous abnormality. IMPRESSION: Right neck central venous catheter tip overlies the distal superior vena cava. Unchanged cardiomegaly and mild edema. Electronically Signed   By: Maurine Simmering   On: 08/12/2020 17:31    aspirin  81 mg Per Tube Daily   chlorhexidine gluconate (MEDLINE KIT)  15 mL Mouth Rinse BID   Chlorhexidine Gluconate Cloth  6 each Topical Daily   darbepoetin (ARANESP) injection - DIALYSIS  60 mcg Intravenous Q Mon-HD   docusate  100 mg Per Tube BID   famotidine  10 mg Per Tube Daily   feeding supplement (PROSource TF)  45 mL Per Tube 5 X Daily   heparin injection (subcutaneous)  5,000 Units Subcutaneous Q8H   insulin aspart  0-15 Units Subcutaneous Q4H   insulin aspart  5 Units Subcutaneous Q4H   mouth rinse  15 mL Mouth Rinse 10 times per day   midodrine  10 mg Per Tube Q8H   multivitamin  1 tablet Per Tube Daily   polyethylene glycol  17 g Per Tube BID   rosuvastatin  20 mg Per Tube Daily   sennosides  5 mL Per Tube BID   sodium chloride flush  10-40 mL Intracatheter Q12H   ticagrelor  90 mg Per Tube BID  Dialysis Orders: Center: [place] [facility]  on 4 treatments/week   124kg  2/2.5 bath  3.5h   Hep 5000   LUE AVF  400/600     Assessment/Plan:  PEA arrest - s/p ACLS with ROSC in 10 minutes. ECHO with EF of 45-50% and Grade I DD. Per CCM.  Acute hypoxic respiratory failure - s/p PEA arrest w/ intubation in the field - possible aspiration.  Per CCM  ESRD - on HD 4x per wk at home. Plan MWF here. Is now on CRRT started 7/25 due to shock.     HypOtension/volume  - on pressors and midodrine started at  5m tid per NG. Vol stable at this time on exam, no edema, at dry wt. Pressors weaning down.  Anemia  - Hgb 11 on admit, down to 6.6 yest > sp prbc's Hb 7.8 today. Started ESA w/ darbe 60ug weekly on monday.   Metabolic bone disease -  npo for now  Nutrition - npo for now. Getting tube feeds H/o CAD s/p NSTEMI PCI and DES DM type II - per primary. Acute metabolic encephalopathy - EEG w/o seizures. MRI without evidence of anoxic brain injury. More responsive today.    RKelly Splinter MD 08/13/2020, 3:32 PM

## 2020-08-13 NOTE — Progress Notes (Signed)
Assisted tele visit to patient with family member.  Nesreen Albano M, RN

## 2020-08-13 NOTE — Plan of Care (Signed)
  Interdisciplinary Goals of Care Family Meeting   Date carried out:: 08/13/2020  Location of the meeting: Phone conference  Member's involved: Physician and Family Member or next of kin  [place] or acting medical decision maker: Husband, Brenda Davis (by phone), and her sister Brenda Davis, daughter Brenda Davis.   Discussion: We discussed goals of care for Brenda Davis .  I explained that [name] has anoxic brain injury and her ultimate prognosis is uncertain.  However we feel that that it is most likely that she will continue to be severely debilitated for the rest of her life given the degree of injury we witness now.  The only way to know for certain if she can improve is to wait and see with aggressive ongoing medical care and 24 hour nursing care.  This would involve a tracheostomy, likely PEG tube, and placement in a facility out of town. The family understands the diagnosis and prognosis but has not yet decided about a plan moving forward.   Code status: Full Code  Disposition: Continue current acute care : asking palliative medicine to see her to discuss this with the family further to ensure they understand the implications of this decision.  Time spent for the meeting: 30 minutes  Roselie Awkward 08/13/2020, 12:49 PM

## 2020-08-14 ENCOUNTER — Inpatient Hospital Stay (HOSPITAL_COMMUNITY): Payer: Medicare HMO

## 2020-08-14 DIAGNOSIS — I469 Cardiac arrest, cause unspecified: Secondary | ICD-10-CM | POA: Diagnosis not present

## 2020-08-14 DIAGNOSIS — J9601 Acute respiratory failure with hypoxia: Secondary | ICD-10-CM | POA: Diagnosis not present

## 2020-08-14 DIAGNOSIS — Z515 Encounter for palliative care: Secondary | ICD-10-CM

## 2020-08-14 DIAGNOSIS — R69 Illness, unspecified: Secondary | ICD-10-CM | POA: Diagnosis not present

## 2020-08-14 DIAGNOSIS — G931 Anoxic brain damage, not elsewhere classified: Secondary | ICD-10-CM | POA: Diagnosis not present

## 2020-08-14 LAB — RENAL FUNCTION PANEL
Albumin: 2.1 g/dL — ABNORMAL LOW (ref 3.5–5.0)
Albumin: 2 g/dL — ABNORMAL LOW (ref 3.5–5.0)
Anion gap: 9 (ref 5–15)
Anion gap: 6 (ref 5–15)
BUN: 46 mg/dL — ABNORMAL HIGH (ref 6–20)
BUN: 37 mg/dL — ABNORMAL HIGH (ref 6–20)
CO2: 27 mmol/L (ref 22–32)
CO2: 26 mmol/L (ref 22–32)
Calcium: 8.6 mg/dL — ABNORMAL LOW (ref 8.9–10.3)
Calcium: 8.5 mg/dL — ABNORMAL LOW (ref 8.9–10.3)
Chloride: 99 mmol/L (ref 98–111)
Chloride: 102 mmol/L (ref 98–111)
Creatinine, Ser: 3.38 mg/dL — ABNORMAL HIGH (ref 0.44–1.00)
Creatinine, Ser: 2.92 mg/dL — ABNORMAL HIGH (ref 0.44–1.00)
GFR, Estimated: 15 mL/min — ABNORMAL LOW (ref >60)
GFR, Estimated: 18 mL/min — ABNORMAL LOW (ref >60)
Glucose, Bld: 189 mg/dL — ABNORMAL HIGH (ref 70–99)
Glucose, Bld: 120 mg/dL — ABNORMAL HIGH (ref 70–99)
Phosphorus: 3.4 mg/dL (ref 2.5–4.6)
Phosphorus: 3.3 mg/dL (ref 2.5–4.6)
Potassium: 4.9 mmol/L (ref 3.5–5.1)
Potassium: 5 mmol/L (ref 3.5–5.1)
Sodium: 135 mmol/L (ref 135–145)
Sodium: 134 mmol/L — ABNORMAL LOW (ref 135–145)

## 2020-08-14 LAB — POCT I-STAT 7, (LYTES, BLD GAS, ICA,H+H)
Acid-Base Excess: 4 mmol/L — ABNORMAL HIGH (ref 0.0–2.0)
Bicarbonate: 28.3 mmol/L — ABNORMAL HIGH (ref 20.0–28.0)
Calcium, Ion: 1.22 mmol/L (ref 1.15–1.40)
HCT: 23 % — ABNORMAL LOW (ref 36.0–46.0)
Hemoglobin: 7.8 g/dL — ABNORMAL LOW (ref 12.0–15.0)
O2 Saturation: 98 %
Patient temperature: 97.7
Potassium: 4.9 mmol/L (ref 3.5–5.1)
Sodium: 137 mmol/L (ref 135–145)
TCO2: 30 mmol/L (ref 22–32)
pCO2 arterial: 39.9 mmHg (ref 32.0–48.0)
pH, Arterial: 7.457 — ABNORMAL HIGH (ref 7.350–7.450)
pO2, Arterial: 100 mmHg (ref 83.0–108.0)

## 2020-08-14 LAB — CBC
HCT: 22.3 % — ABNORMAL LOW (ref 36.0–46.0)
Hemoglobin: 7.2 g/dL — ABNORMAL LOW (ref 12.0–15.0)
MCH: 31 pg (ref 26.0–34.0)
MCHC: 32.3 g/dL (ref 30.0–36.0)
MCV: 96.1 fL (ref 80.0–100.0)
Platelets: 250 10*3/uL (ref 150–400)
RBC: 2.32 MIL/uL — ABNORMAL LOW (ref 3.87–5.11)
RDW: 16.4 % — ABNORMAL HIGH (ref 11.5–15.5)
WBC: 10.6 10*3/uL — ABNORMAL HIGH (ref 4.0–10.5)
nRBC: 0.7 % — ABNORMAL HIGH (ref 0.0–0.2)

## 2020-08-14 LAB — GLUCOSE, CAPILLARY
Glucose-Capillary: 180 mg/dL — ABNORMAL HIGH (ref 70–99)
Glucose-Capillary: 181 mg/dL — ABNORMAL HIGH (ref 70–99)
Glucose-Capillary: 77 mg/dL (ref 70–99)
Glucose-Capillary: 146 mg/dL — ABNORMAL HIGH (ref 70–99)

## 2020-08-14 LAB — MAGNESIUM: Magnesium: 2.7 mg/dL — ABNORMAL HIGH (ref 1.7–2.4)

## 2020-08-14 LAB — APTT: aPTT: 46 seconds — ABNORMAL HIGH (ref 24–36)

## 2020-08-14 MED ORDER — PROSOURCE TF PO LIQD
45.0000 mL | Freq: Every day | ORAL | Status: DC
  Administered 2020-08-14 – 2020-08-15 (×5): 45 mL
  Filled 2020-08-14 (×5): qty 45

## 2020-08-14 MED ORDER — VITAL 1.5 CAL PO LIQD
1000.0000 mL | ORAL | Status: DC
  Administered 2020-08-14: 1000 mL

## 2020-08-14 MED ORDER — DEXMEDETOMIDINE HCL IN NACL 400 MCG/100ML IV SOLN
0.2000 ug/kg/h | INTRAVENOUS | Status: DC
  Administered 2020-08-14 (×3): 0.8 ug/kg/h via INTRAVENOUS
  Administered 2020-08-15: 0.4 ug/kg/h via INTRAVENOUS
  Administered 2020-08-15: 0.5 ug/kg/h via INTRAVENOUS
  Filled 2020-08-14: qty 200
  Filled 2020-08-14: qty 100

## 2020-08-14 NOTE — Progress Notes (Signed)
NAME:  DREW HERMAN, MRN:  [id number], DOB:  [birth year], LOS: 47 ADMISSION DATE:  08/05/2020, CONSULTATION DATE:  08/13/2020 REFERRING MD:  Johnney Killian, CHIEF COMPLAINT:  Cardiac Arrest   History of Present Illness:  BAHAR SHELDEN is a 55 y.o. female with a PMH significant for ESRD on dialysis at home 4 days a week, CAD s/p PCI with DES, HTN, HLD, type 2 diabetes, morbid obesity, and anemia who was at an outpatient endoscopy procedure where she suffered a cardiac arrest. Per family she was undergoing a planned colonoscopy and endoscopy for a history of persistent nausea and vomiting. Per report patient had tolerated procedure well until she suddenly suffered a PEA cardiac arrest. Arrest time was estimated at 10 mins, she received 2 round of epi and compressions. Patient was intubated during arrest. Spouse denies any other acute complaints prior to admission.  Pertinent  Medical History  ESRD on HD 4 days/week, CAD s/p PCI with DES, HTN, HLD, T2DM, morbid obesity, anemia  Significant Hospital Events: Including procedures, antibiotic start and stop dates in addition to other pertinent events   7/19: admitted after 10 min PEA arrest during outpatient endoscopy/colonoscopy 7/20: some involuntary movements noted, EEG and MRI obtained 7/21: cardiology consulted, started IV Heparin 7/22: Heparin held due to profuse bleeding from the mouth, ?tongue bite , HD terminated 1.5 hours due to clotting and agitation, RN reported wiggling toes to command 7/23: Precedex started for agitation 7/25: Neuro consulted, central venous catheter placed and CRRT started 7/26: More alert, following commands  Antibiotics Unasyn 7/19-7/25  Interim History / Subjective:  Patient woke up yesterday afternoon, started following commands appropriately. No acute events overnight.   Objective   Blood pressure (!) 109/50, pulse (!) 59, temperature 97.6 F (36.4 C), temperature source Oral, resp. rate  19, height 5' 5"  (1.651 m), weight 124.2 kg, SpO2 100 %.    Vent Mode: CPAP;PSV FiO2 (%):  [40 %] 40 % Set Rate:  [16 bmp] 16 bmp Vt Set:  [450 mL] 450 mL PEEP:  [5 cmH20] 5 cmH20 Pressure Support:  [5 cmH20] 5 cmH20 Plateau Pressure:  [18 cmH20-20 cmH20] 20 cmH20   Intake/Output Summary (Last 24 hours) at 08/14/2020 0817 Last data filed at 08/14/2020 0800 Gross per 24 hour  Intake 2679.31 ml  Output 2618 ml  Net 61.31 ml   Filed Weights   08/12/20 0152 08/13/20 0500 08/14/20 0500  Weight: 125.9 kg 124 kg 124.2 kg    Examination: General: alert, obese, NAD HENT: ETT in place Lungs: normal effort, CTAB Cardiovascular: RRR, normal S1/S2 without m/r/g Abdomen: +BS, soft, nontender Extremities: warm and dry Neuro: Awake, alert, follows simple commands  Resolved Hospital Problem list   Lactic acidosis Elevated Troponin Aspiration PNA  Assessment & Plan:  Anoxic Brain Injury s/p cardiac arrest No significant abnormality on MRI brain or EEG. Patient off sedation this morning and remains alert, intermittently follows commands P: -Neuro following, appreciate recommendations -Monitor mental status closely -Plan SBT and potential extubation today, see below   Hypotension Improving after addition of Midodrine yesterday. P: -Continue Midodrine 36m TID -Wean levo as tolerated, goal MAP>65  Acute Hypoxic Respiratory Failure Weaning this morning. P: -SBT currently -Anticipate extubation today   Anemia, Oral bleeding from tongue bite wound Hgb stable at 7.2 this morning. Oral bleeding has subsided. P: -Monitor CBC -Continuing Brilinta and ASA   PEA arrest Elevated troponin WMA on echo H/o CAD s/p PCI and DES P: -Cardiology following, appreciate recs -Continue Brilinta, ASA,  statin -Per cards, repeat echo vs cath pending neuro recovery   ESRD P: -Continue CRRT -Nephro following, appreciate recs   T2DM CBGs 155-211 over past 24h. Required 40u Aspart  yesterday. P: -SSI -q4h tube feed coverage -CBG monitoring  Hypothermia Improving w/bair hugger P: -Monitor temp -Continue warming blanket  Best Practice (right click and "Reselect all SmartList Selections" daily)   Diet/type: tubefeeds DVT prophylaxis: prophylactic heparin  GI prophylaxis: PPI Lines: Dialysis Catheter and Arterial Line Foley:  N/A Code Status:  full code Last date of multidisciplinary goals of care discussion [7/26]  Labs   CBC: Recent Labs  Lab 08/11/20 0203 08/12/20 0157 08/12/20 1728 08/13/20 0329 08/14/20 0307  WBC 10.2 10.1 13.7* 12.4* 10.6*  HGB 8.7* 6.6* 7.6* 7.8* 7.2*  HCT 26.7* 20.3* 22.8* 24.2* 22.3*  MCV 96.7 95.3 93.1 93.8 96.1  PLT 248 223 233 225 789    Basic Metabolic Panel: Recent Labs  Lab 08/07/20 1647 08/08/20 0142 08/11/20 0203 08/11/20 1642 08/12/20 0157 08/13/20 0313 08/13/20 0329 08/13/20 1504 08/14/20 0307  NA  --    < > 135 131* 133* 133*  --  134* 135  K  --    < > 5.4* 5.1 4.2 4.8  --  4.9 4.9  CL  --    < > 96* 94* 99 97*  --  99 99  CO2  --    < > 24 21* 19* 23  --  26 27  GLUCOSE  --    < > 169* 192* 143* 256*  --  171* 189*  BUN  --    < > 75* 92* 92* 86*  --  62* 46*  CREATININE  --    < > 8.37* 9.41* 8.19* 6.77*  --  4.87* 3.38*  CALCIUM  --    < > 8.8* 8.4* 7.1* 8.4*  --  8.6* 8.6*  MG 2.3  --   --   --   --   --  2.6*  --  2.7*  PHOS 6.4*   < > 6.2*  --  5.7* 5.5*  --  4.6 3.4   < > = values in this interval not displayed.   GFR: Estimated Creatinine Clearance: 24.9 mL/min (A) (by C-G formula based on SCr of 3.38 mg/dL (H)). Recent Labs  Lab 08/12/20 0157 08/12/20 1728 08/12/20 2309 08/13/20 0329 08/14/20 0307  WBC 10.1 13.7*  --  12.4* 10.6*  LATICACIDVEN  --  0.7 0.7  --   --     Liver Function Tests: Recent Labs  Lab 08/08/20 0142 08/09/20 0708 08/11/20 0203 08/12/20 0157 08/13/20 0313 08/13/20 1504 08/14/20 0307  AST 36  --   --   --   --   --   --   ALT 45*  --   --   --    --   --   --   ALKPHOS 66  --   --   --   --   --   --   BILITOT 0.7  --   --   --   --   --   --   PROT 6.2*  --   --   --   --   --   --   ALBUMIN 2.8*   < > 2.5* 2.2* 2.1* 2.2* 2.1*   < > = values in this interval not displayed.   No results for input(s): LIPASE, AMYLASE in the last 168 hours. Recent Labs  Lab 08/13/20 0943  AMMONIA 38*    ABG    Component Value Date/Time   PHART 7.443 07/21/2020 1427   PCO2ART 49.2 (H) 08/08/2020 1427   PO2ART 226 (H) 07/21/2020 1427   HCO3 33.9 (H) 08/12/2020 1427   TCO2 35 (H) 07/23/2020 1427   ACIDBASEDEF 1.0 01/18/2019 2358   O2SAT 100.0 08/10/2020 1427     Coagulation Profile: No results for input(s): INR, PROTIME in the last 168 hours.   Cardiac Enzymes: No results for input(s): CKTOTAL, CKMB, CKMBINDEX, TROPONINI in the last 168 hours.  HbA1C: Hemoglobin A1C  Date/Time Value Ref Range Status  07/27/2017 12:00 AM 6.8  Final  05/24/2017 12:00 AM 5.8  Final   Hgb A1c MFr Bld  Date/Time Value Ref Range Status  07/31/2020 12:51 PM 5.5 4.8 - 5.6 % Final    Comment:    (NOTE) Pre diabetes:          5.7%-6.4%  Diabetes:              >6.4%  Glycemic control for   <7.0% adults with diabetes   02/23/2020 09:31 AM 6.3 4.6 - 6.5 % Final    Comment:    Glycemic Control Guidelines for People with Diabetes:Non Diabetic:  <6%Goal of Therapy: <7%Additional Action Suggested:  >8%     CBG: Recent Labs  Lab 08/13/20 1528 08/13/20 1925 08/13/20 2318 08/14/20 0318 08/14/20 0722  GLUCAP 153* 155* 189* 180* 181*    Past Medical History:  She,  has a past medical history of Anemia, CAD (coronary artery disease), ESRD on dialysis ([place]), Gout, HCAP (healthcare-associated pneumonia) (05/06/2016), History of blood transfusion, Hyperlipidemia, Hypertension, Morbid obesity ([place]), Pain, Palpitations (09/24/2016), Peritonitis, dialysis-associated ([place]), SVT (supraventricular tachycardia) ([place]) (02/28/2020), and Type II diabetes mellitus  ([place]).   Surgical History:   Past Surgical History:  Procedure Laterality Date   AV FISTULA PLACEMENT Left 01/03/2015   Procedure: BRACHIAL CEPHALIC ARTERIOVENOUS  FISTULA CREATION LEFT ARM;  Surgeon: Angelia Mould, MD;  Location: [facility];  Service: Vascular;  Laterality: Left;   CARPAL TUNNEL RELEASE Right    [place]  09/14/2017   CORONARY STENT INTERVENTION N/A 01/17/2019   Procedure: CORONARY STENT INTERVENTION;  Surgeon: Jettie Booze, MD;  Location: [facility];  Service: Cardiovascular;  Laterality: N/A;   ESOPHAGOGASTRODUODENOSCOPY N/A 09/04/2012   Procedure: ESOPHAGOGASTRODUODENOSCOPY (EGD);  Surgeon: Shann Medal, MD;  Location: [facility];  Service: General;  Laterality: N/A;  PF   ESOPHAGOGASTRODUODENOSCOPY (EGD) WITH ESOPHAGEAL DILATION N/A 09/29/2012   Procedure: ESOPHAGOGASTRODUODENOSCOPY (EGD) WITH ESOPHAGEAL DILATION;  Surgeon: Milus Banister, MD;  Location: [facility];  Service: Endoscopy;  Laterality: N/A;   FEMORAL ARTERY EXPLORATION Right 01/17/2019   Procedure: Exploration Right Groin with Primary Closure or Arteriotomy Site; Washout of Retroperitoneal Hematoma;  Surgeon: Waynetta Sandy, MD;  Location: [facility];  Service: Vascular;  Laterality: Right;   INSERTION OF DIALYSIS CATHETER N/A 01/03/2015   Procedure: INSERTION OF DIALYSIS CATHETER RIGHT INTERNAL JUGULAR;  Surgeon: Angelia Mould, MD;  Location: [facility];  Service: Vascular;  Laterality: N/A;   LAPAROSCOPIC GASTRIC SLEEVE RESECTION N/A 07/19/2012   Procedure: LAPAROSCOPIC SLEEVE GASTRECTOMY with EGD;  Surgeon: Madilyn Hook, DO;  Location: [facility];  Service: General;  Laterality: N/A;  laparoscopic sleeve gastrectomy with EGD   LEFT HEART CATH AND CORONARY ANGIOGRAPHY N/A 02/25/2018   Procedure: LEFT HEART CATH AND CORONARY ANGIOGRAPHY;  Surgeon: Leonie Man, MD;  Location:  [facility];  Service: Cardiovascular;  Laterality: N/A;    LEFT HEART CATH AND CORONARY ANGIOGRAPHY N/A 06/20/2018   Procedure: LEFT HEART CATH AND CORONARY ANGIOGRAPHY;  Surgeon: Nelva Bush, MD;  Location: [facility];  Service: Cardiovascular;  Laterality: N/A;   LEFT HEART CATH AND CORONARY ANGIOGRAPHY N/A 01/16/2019   Procedure: LEFT HEART CATH AND CORONARY ANGIOGRAPHY;  Surgeon: Jettie Booze, MD;  Location: [facility];  Service: Cardiovascular;  Laterality: N/A;   LEFT HEART CATH AND CORONARY ANGIOGRAPHY N/A 03/03/2019   Procedure: LEFT HEART CATH AND CORONARY ANGIOGRAPHY;  Surgeon: Jettie Booze, MD;  Location: [facility];  Service: Cardiovascular;  Laterality: N/A;   PERIPHERAL VASCULAR BALLOON ANGIOPLASTY Left 08/18/2019   Procedure: PERIPHERAL VASCULAR BALLOON ANGIOPLASTY;  Surgeon: Marty Heck, MD;  Location: [facility];  Service: Cardiovascular;  Laterality: Left;  arm fistula   PERIPHERAL VASCULAR CATHETERIZATION Left 05/23/2015   Procedure: Nolon Stalls;  Surgeon: Conrad Kit Carson, MD;  Location: [facility];  Service: Cardiovascular;  Laterality: Left;  upper aRM   PERIPHERAL VASCULAR CATHETERIZATION Left 05/23/2015   Procedure: Peripheral Vascular Balloon Angioplasty;  Surgeon: Conrad [place], MD;  Location: [facility];  Service: Cardiovascular;  Laterality: Left;  av fistula   TUBAL LIGATION  1993   UPPER GI ENDOSCOPY N/A 07/19/2012   Procedure: UPPER GI ENDOSCOPY;  Surgeon: Madilyn Hook, DO;  Location: [facility];  Service: General;  Laterality: N/A;     Social History:   reports that she quit smoking about 19 years ago. Her smoking use included cigarettes. She has a 16.00 pack-year smoking history. She has never used smokeless tobacco. She reports that she does not drink alcohol and does not use drugs.   Family History:  Her family history includes Arthritis in an other family member; Diabetes in her father; Hyperlipidemia in an other family member; Hypertension in her mother;  Mitral valve prolapse in her mother. There is no history of Cancer, Heart disease, Kidney disease, or Stroke.   Allergies Allergies  Allergen Reactions   Amlodipine Swelling   Atorvastatin     Elevated LFT's   Clonidine Derivatives Swelling    Limbs swell   Doxycycline Nausea And Vomiting    "I threw up for 3 hours"   Welchol [Colesevelam Hcl] Nausea Only     Home Medications  Prior to Admission medications   Medication Sig Start Date End Date Taking? Authorizing Provider  acetaminophen (TYLENOL) 500 MG tablet Take 1,000 mg by mouth every 6 (six) hours as needed for moderate pain or headache.   Yes [provider]  B Complex-C-Biotin-D-FA (DIALYVITE 800 PLUS D) 800 MCG WAFR Take by mouth. 09/04/19  Yes [provider]  calcium acetate (PHOSLO) 667 MG capsule Take 667-1,334 mg by mouth See admin instructions. Take 1334 mg with each meal and 667 mg with each snack 06/02/18  Yes [provider]  carvedilol (COREG) 25 MG tablet Take 1 tablet (25 mg total) by mouth 2 (two) times daily. 04/23/20  Yes Skeet Latch, MD  cinacalcet (SENSIPAR) 30 MG tablet Take 30 mg by mouth 3 (three) times a week. Mon, Wed, Fri 04/11/20  Yes [provider]  diltiazem (CARDIZEM CD) 120 MG 24 hr capsule Take 1 capsule (120 mg total) by mouth daily. 02/28/20 08/09/20 Yes Skeet Latch, MD  gabapentin (NEURONTIN) 100 MG capsule Take 100 mg by mouth 2 (two) times daily. 10/31/18  Yes [provider]  isosorbide mononitrate (IMDUR) 30 MG 24 hr tablet Take 1 tablet (30 mg total) by mouth daily. 04/24/20 08/09/20 Yes Cleaver, Jossie Ng, NP  nitroGLYCERIN (NITROSTAT) 0.4 MG SL tablet Place 1 tablet (0.4 mg total) under the tongue every 5 (five) minutes as needed for chest pain. 07/26/20  Yes Skeet Latch, MD  ondansetron (ZOFRAN) 4 MG tablet Take 1 tablet (4 mg total) by mouth every 8 (eight) hours as needed for nausea or vomiting. 3/33/54  Yes Delora Fuel, MD  rosuvastatin  (CRESTOR) 20 MG tablet Take 1 tablet (20 mg total) by mouth daily. 04/23/20  Yes Skeet Latch, MD  ticagrelor (BRILINTA) 90 MG TABS tablet Take 1 tablet (90 mg total) by mouth 2 (two) times daily. 05/28/20  Yes Skeet Latch, MD  calcitRIOL (ROCALTROL) 0.5 MCG capsule Take 1.5 mcg by mouth daily.  04/29/19   [provider]  thiamine (VITAMIN B-1) 50 MG tablet Take 1 tablet (50 mg total) by mouth daily. Patient not taking: No sig reported 03/01/20   [name], MD     Critical care time: see attending note      Alcus Dad, MD PGY-2 [place]

## 2020-08-14 NOTE — Progress Notes (Signed)
SLP Cancellation Note  Patient Details Name: Brenda Davis MRN: [id number] DOB: 1965-11-23   Cancelled treatment:       Reason Eval/Treat Not Completed: Patient not medically ready (Pt is currently on the vent, but pt is off sedation and weaning has been started. SLP will follow up.)  Caron Ode I. Hardin Negus, [place], [place] Office number [phone] Pager [place] 08/14/2020, 10:34 AM

## 2020-08-14 NOTE — Progress Notes (Signed)
Attending:    Subjective: Woke up yesterday and started following commands No other acute events Remains on mechanical ventilation and CRRT Off sedation this morning Has been weaning on pressure support since 8:30 AM  Objective: Vitals:   08/14/20 0900 08/14/20 0915 08/14/20 0930 08/14/20 0945  BP:      Pulse: 61 60 63 62  Resp: 17 19 (!) 21 20  Temp:      TempSrc:      SpO2: 100% 100% 100% 100%  Weight:      Height:       Vent Mode: CPAP;PSV FiO2 (%):  [40 %] 40 % Set Rate:  [16 bmp] 16 bmp Vt Set:  [450 mL] 450 mL PEEP:  [5 cmH20] 5 cmH20 Pressure Support:  [5 cmH20] 5 cmH20 Plateau Pressure:  [18 cmH20-20 cmH20] 20 cmH20  Intake/Output Summary (Last 24 hours) at 08/14/2020 1016 Last data filed at 08/14/2020 1000 Gross per 24 hour  Intake 2617.06 ml  Output 2470 ml  Net 147.06 ml    General:  In bed on vent HENT: NCAT ETT in place PULM: CTA B, vent supported breathing CV: RRR, no mgr GI: BS+, soft, nontender MSK: normal bulk and tone Neuro: awake, looking around room, not following commands this morning    CBC    Component Value Date/Time   WBC 10.6 (H) 08/14/2020 0307   RBC 2.32 (L) 08/14/2020 0307   HGB 7.2 (L) 08/14/2020 0307   HGB 8.1 (L) 02/28/2020 1047   HCT 22.3 (L) 08/14/2020 0307   HCT 25.0 (L) 02/28/2020 1047   PLT 250 08/14/2020 0307   PLT 218 02/28/2020 1047   MCV 96.1 08/14/2020 0307   MCV 98 (H) 02/28/2020 1047   MCH 31.0 08/14/2020 0307   MCHC 32.3 08/14/2020 0307   RDW 16.4 (H) 08/14/2020 0307   RDW 15.0 02/28/2020 1047   LYMPHSABS 1.4 07/25/2020 1254   LYMPHSABS 1.1 02/28/2020 1047   MONOABS 0.3 08/14/2020 1254   EOSABS 0.1 07/24/2020 1254   EOSABS 0.0 02/28/2020 1047   BASOSABS 0.0 08/01/2020 1254   BASOSABS 0.0 02/28/2020 1047    BMET    Component Value Date/Time   NA 135 08/14/2020 0307   NA 139 02/28/2020 1047   K 4.9 08/14/2020 0307   CL 99 08/14/2020 0307   CO2 27 08/14/2020 0307   GLUCOSE 189 (H) 08/14/2020 0307    BUN 46 (H) 08/14/2020 0307   BUN 29 (H) 02/28/2020 1047   CREATININE 3.38 (H) 08/14/2020 0307   CREATININE 2.28 (H) 03/21/2012 0952   CALCIUM 8.6 (L) 08/14/2020 0307   GFRNONAA 15 (L) 08/14/2020 0307   GFRAA 6 (L) 02/28/2020 1047    CXR images none today  Impression/Plan: Anoxic brain injury: mental status improved yesterday; hold sedation today, frequent neuro exams, plan spontaneous breathing trial; this morning she is not following commands but it appears to wax and wane; suspect prolonged recovery; stop sedation protocol now; will need PT/OT consult after extubation Cardiac arrest: LHC decision per cardiology, continue CAD management with statin Hypotenstion, chronic: continue midodrine, wean off levophed for MAP < 65 Acute respiratory failure with hypoxemia: spontaneous breathing trial this morning, hopeful for extubation Tongue bleeding: monitor Hyperglycemia without prior DM2: continue tube feeding coverage and SSI  Goals of care: it appears she is improving, will attempt extubation today, hopefully she will continue to improve  My cc time 35 minutes  Roselie Awkward, MD [place] [facility] Pager: [phone] Cell: [phone] After 7pm: [phone]

## 2020-08-14 NOTE — Progress Notes (Signed)
[facility] Progress Note Patient Name: Brenda Davis DOB: April 16, 1965 MRN: [id number]   Date of Service  08/14/2020  HPI/Events of Note  Nursing concerned that patient is not able to handle oral secretions. Sat = 100% and RR = 21 on [place] O2. Patient extubated earlier today, however, remains on Precedex IV infusion at 0.6 mcg/kg/hour. The patient appears over sedated on video assessment.  eICU Interventions  Plan: Decrease Precedex IV infusion to 0.3 mcg/kg/hour.  Change Precedex titration order to 0.2 to 0.5 mcg/kg/hour. Titrate to RASS = 0.      Intervention Category Major Interventions: Other:  Lysle Dingwall 08/14/2020, 10:49 PM

## 2020-08-14 NOTE — Progress Notes (Addendum)
Subjective: Bedside RN notes that she began to wake up around 1400 yesterday. She has been following commands for nursing staff however sedation had to be turned up this morning due to agitation.  Did not tolerate HD on Monday due to hypotension which is supposedly a chronic issue for which she typically takes midodrine for. She was placed on CRRT and pressors and midodrine was restarted.  Failed SBT yesterday Critical care discussed goals of care with family 7/26, explaining that she will likely be severely disabled for the remainder of her life, given the degree of anoxic injury, and would need to start thinking about trach and PEG. Family needed time to think about it prior to making a decision.   Objective: Current vital signs: BP (!) 109/50   Pulse 61   Temp 97.6 F (36.4 C) (Oral)   Resp (!) 21   Ht 5' 5"  (1.651 m)   Wt 124.2 kg   SpO2 100%   BMI 45.56 kg/m  Vital signs in last 24 hours: Temp:  [94.4 F (34.7 C)-98.8 F (37.1 C)] 97.6 F (36.4 C) (07/27 0724) Pulse Rate:  [50-67] 61 (07/27 0700) Resp:  [16-25] 21 (07/27 0700) BP: (71-115)/(36-91) 109/50 (07/26 2334) SpO2:  [98 %-100 %] 100 % (07/27 0700) Arterial Line BP: (91-123)/(39-56) 106/45 (07/27 0700) FiO2 (%):  [40 %] 40 % (07/27 0400) Weight:  [124.2 kg] 124.2 kg (07/27 0500)  Intake/Output from previous day: 07/26 0701 - 07/27 0700 In: 2657.5 [I.V.:1114.7; NG/GT:1542.8] Out: 2547  Intake/Output this shift: No intake/output data recorded. Nutritional status:  Diet Order             Diet NPO time specified  Diet effective now                  General: critcially ill appearing on CRRT, intubated, on levophed  Neurologic Exam: on 69m/hr precedex Mental status: sedated. Opens eyes to voice. Does not follow commands. Does not respond to questions Cranial nerves: PERRL. Miosis bilaterally. Forcefully closes the right eye with light stimulation. Does not do this on the left. Blinks to threat on the right.  Does not track--right eye seems to move more than the left. No obvious facial droop. Gag reflex present. Motor: moves all extremities non-purposefully Sensory: withdraws to noxious stimuli in the lower extremities.  Plantar reflex: toes up-going bilaterally   Lab Results: Results for orders placed or performed during the hospital encounter of 08/04/2020 (from the past 48 hour(s))  Glucose, capillary     Status: Abnormal   Collection Time: 08/12/20  7:45 AM  Result Value Ref Range   Glucose-Capillary 170 (H) 70 - 99 mg/dL    Comment: Glucose reference range applies only to samples taken after fasting for at least 8 hours.  Glucose, capillary     Status: Abnormal   Collection Time: 08/12/20 11:07 AM  Result Value Ref Range   Glucose-Capillary 139 (H) 70 - 99 mg/dL    Comment: Glucose reference range applies only to samples taken after fasting for at least 8 hours.  Glucose, capillary     Status: Abnormal   Collection Time: 08/12/20  3:47 PM  Result Value Ref Range   Glucose-Capillary 181 (H) 70 - 99 mg/dL    Comment: Glucose reference range applies only to samples taken after fasting for at least 8 hours.  CBC     Status: Abnormal   Collection Time: 08/12/20  5:28 PM  Result Value Ref Range   WBC 13.7 (H)  4.0 - 10.5 K/uL   RBC 2.45 (L) 3.87 - 5.11 MIL/uL   Hemoglobin 7.6 (L) 12.0 - 15.0 g/dL   HCT 22.8 (L) 36.0 - 46.0 %   MCV 93.1 80.0 - 100.0 fL   MCH 31.0 26.0 - 34.0 pg   MCHC 33.3 30.0 - 36.0 g/dL   RDW 16.1 (H) 11.5 - 15.5 %   Platelets 233 150 - 400 K/uL   nRBC 0.0 0.0 - 0.2 %    Comment: Performed at [facility] [address], [place], [place] [address]  Lactic acid, plasma     Status: None   Collection Time: 08/12/20  5:28 PM  Result Value Ref Range   Lactic Acid, Venous 0.7 0.5 - 1.9 mmol/L    Comment: Performed at [facility] [address], [place], [place] [address]  Glucose, capillary     Status: Abnormal   Collection Time: 08/12/20  7:34 PM   Result Value Ref Range   Glucose-Capillary 190 (H) 70 - 99 mg/dL    Comment: Glucose reference range applies only to samples taken after fasting for at least 8 hours.  Lactic acid, plasma     Status: None   Collection Time: 08/12/20 11:09 PM  Result Value Ref Range   Lactic Acid, Venous 0.7 0.5 - 1.9 mmol/L    Comment: Performed at [facility], [place] [address] [place], [place] [address]  Glucose, capillary     Status: Abnormal   Collection Time: 08/12/20 11:43 PM  Result Value Ref Range   Glucose-Capillary 232 (H) 70 - 99 mg/dL    Comment: Glucose reference range applies only to samples taken after fasting for at least 8 hours.  Renal function panel (daily at 0500)     Status: Abnormal   Collection Time: 08/13/20  3:13 AM  Result Value Ref Range   Sodium 133 (L) 135 - 145 mmol/L   Potassium 4.8 3.5 - 5.1 mmol/L   Chloride 97 (L) 98 - 111 mmol/L   CO2 23 22 - 32 mmol/L   Glucose, Bld 256 (H) 70 - 99 mg/dL    Comment: Glucose reference range applies only to samples taken after fasting for at least 8 hours.   BUN 86 (H) 6 - 20 mg/dL   Creatinine, Ser 6.77 (H) 0.44 - 1.00 mg/dL   Calcium 8.4 (L) 8.9 - 10.3 mg/dL   Phosphorus 5.5 (H) 2.5 - 4.6 mg/dL   Albumin 2.1 (L) 3.5 - 5.0 g/dL   GFR, Estimated 7 (L) >60 mL/min    Comment: (NOTE) Calculated using the CKD-EPI Creatinine Equation (2021)    Anion gap 13 5 - 15    Comment: Performed at [facility] [address] [place], [place] [address]  Glucose, capillary     Status: Abnormal   Collection Time: 08/13/20  3:23 AM  Result Value Ref Range   Glucose-Capillary 248 (H) 70 - 99 mg/dL    Comment: Glucose reference range applies only to samples taken after fasting for at least 8 hours.  CBC     Status: Abnormal   Collection Time: 08/13/20  3:29 AM  Result Value Ref Range   WBC 12.4 (H) 4.0 - 10.5 K/uL   RBC 2.58 (L) 3.87 - 5.11 MIL/uL   Hemoglobin 7.8 (L) 12.0 - 15.0 g/dL   HCT 24.2 (L) 36.0 - 46.0 %   MCV 93.8  80.0 - 100.0 fL   MCH 30.2 26.0 - 34.0  pg   MCHC 32.2 30.0 - 36.0 g/dL   RDW 16.2 (H) 11.5 - 15.5 %   Platelets 225 150 - 400 K/uL   nRBC 0.0 0.0 - 0.2 %    Comment: Performed at [facility], [place] [address] [place], [place] [address]  Iron and TIBC     Status: Abnormal   Collection Time: 08/13/20  3:29 AM  Result Value Ref Range   Iron 25 (L) 28 - 170 ug/dL   TIBC 196 (L) 250 - 450 ug/dL   Saturation Ratios 13 10.4 - 31.8 %   UIBC 171 ug/dL    Comment: Performed at [facility], [place] [address] [place], [place] [address]  Magnesium     Status: Abnormal   Collection Time: 08/13/20  3:29 AM  Result Value Ref Range   Magnesium 2.6 (H) 1.7 - 2.4 mg/dL    Comment: Performed at [facility] [address] [place], [place] [address]  Glucose, capillary     Status: Abnormal   Collection Time: 08/13/20  7:27 AM  Result Value Ref Range   Glucose-Capillary 211 (H) 70 - 99 mg/dL    Comment: Glucose reference range applies only to samples taken after fasting for at least 8 hours.  Ammonia     Status: Abnormal   Collection Time: 08/13/20  9:43 AM  Result Value Ref Range   Ammonia 38 (H) 9 - 35 umol/L    Comment: Performed at [facility], [place] [address], [place], [place] [address]  Glucose, capillary     Status: Abnormal   Collection Time: 08/13/20 11:17 AM  Result Value Ref Range   Glucose-Capillary 189 (H) 70 - 99 mg/dL    Comment: Glucose reference range applies only to samples taken after fasting for at least 8 hours.  Renal function panel (daily at 1600)     Status: Abnormal   Collection Time: 08/13/20  3:04 PM  Result Value Ref Range   Sodium 134 (L) 135 - 145 mmol/L   Potassium 4.9 3.5 - 5.1 mmol/L   Chloride 99 98 - 111 mmol/L   CO2 26 22 - 32 mmol/L   Glucose, Bld 171 (H) 70 - 99 mg/dL    Comment: Glucose reference range applies only to samples taken after fasting for at least 8 hours.   BUN 62 (H) 6 - 20 mg/dL   Creatinine, Ser 4.87 (H) 0.44 - 1.00  mg/dL   Calcium 8.6 (L) 8.9 - 10.3 mg/dL   Phosphorus 4.6 2.5 - 4.6 mg/dL   Albumin 2.2 (L) 3.5 - 5.0 g/dL   GFR, Estimated 10 (L) >60 mL/min    Comment: (NOTE) Calculated using the CKD-EPI Creatinine Equation (2021)    Anion gap 9 5 - 15    Comment: Performed at [facility] [address], [place], [place] [address]  APTT     Status: Abnormal   Collection Time: 08/13/20  3:21 PM  Result Value Ref Range   aPTT 50 (H) 24 - 36 seconds    Comment:        IF BASELINE aPTT IS ELEVATED, SUGGEST PATIENT RISK ASSESSMENT BE USED TO DETERMINE APPROPRIATE ANTICOAGULANT THERAPY. Performed at [facility], [place] [address] [place], [place] [address]   Glucose, capillary     Status: Abnormal   Collection Time: 08/13/20  3:28 PM  Result Value Ref Range   Glucose-Capillary 153 (H) 70 - 99 mg/dL  Comment: Glucose reference range applies only to samples taken after fasting for at least 8 hours.  Glucose, capillary     Status: Abnormal   Collection Time: 08/13/20  7:25 PM  Result Value Ref Range   Glucose-Capillary 155 (H) 70 - 99 mg/dL    Comment: Glucose reference range applies only to samples taken after fasting for at least 8 hours.  Glucose, capillary     Status: Abnormal   Collection Time: 08/13/20 11:18 PM  Result Value Ref Range   Glucose-Capillary 189 (H) 70 - 99 mg/dL    Comment: Glucose reference range applies only to samples taken after fasting for at least 8 hours.  CBC     Status: Abnormal   Collection Time: 08/14/20  3:07 AM  Result Value Ref Range   WBC 10.6 (H) 4.0 - 10.5 K/uL   RBC 2.32 (L) 3.87 - 5.11 MIL/uL   Hemoglobin 7.2 (L) 12.0 - 15.0 g/dL   HCT 22.3 (L) 36.0 - 46.0 %   MCV 96.1 80.0 - 100.0 fL   MCH 31.0 26.0 - 34.0 pg   MCHC 32.3 30.0 - 36.0 g/dL   RDW 16.4 (H) 11.5 - 15.5 %   Platelets 250 150 - 400 K/uL   nRBC 0.7 (H) 0.0 - 0.2 %    Comment: Performed at [facility] [address] [place], [place] [address]  Renal function panel  (daily at 0500)     Status: Abnormal   Collection Time: 08/14/20  3:07 AM  Result Value Ref Range   Sodium 135 135 - 145 mmol/L   Potassium 4.9 3.5 - 5.1 mmol/L   Chloride 99 98 - 111 mmol/L   CO2 27 22 - 32 mmol/L   Glucose, Bld 189 (H) 70 - 99 mg/dL    Comment: Glucose reference range applies only to samples taken after fasting for at least 8 hours.   BUN 46 (H) 6 - 20 mg/dL   Creatinine, Ser 3.38 (H) 0.44 - 1.00 mg/dL   Calcium 8.6 (L) 8.9 - 10.3 mg/dL   Phosphorus 3.4 2.5 - 4.6 mg/dL   Albumin 2.1 (L) 3.5 - 5.0 g/dL   GFR, Estimated 15 (L) >60 mL/min    Comment: (NOTE) Calculated using the CKD-EPI Creatinine Equation (2021)    Anion gap 9 5 - 15    Comment: Performed at [facility] [address], [place], [place] [address]  Magnesium     Status: Abnormal   Collection Time: 08/14/20  3:07 AM  Result Value Ref Range   Magnesium 2.7 (H) 1.7 - 2.4 mg/dL    Comment: Performed at [facility] [address] [place], [place] [address]  APTT     Status: Abnormal   Collection Time: 08/14/20  3:07 AM  Result Value Ref Range   aPTT 46 (H) 24 - 36 seconds    Comment:        IF BASELINE aPTT IS ELEVATED, SUGGEST PATIENT RISK ASSESSMENT BE USED TO DETERMINE APPROPRIATE ANTICOAGULANT THERAPY. Performed at [facility], [place] [address], [place], [place] [address]   Glucose, capillary     Status: Abnormal   Collection Time: 08/14/20  3:18 AM  Result Value Ref Range   Glucose-Capillary 180 (H) 70 - 99 mg/dL    Comment: Glucose reference range applies only to samples taken after fasting for at least 8 hours.    Recent Results (from the past 240 hour(s))  Resp Panel by  RT-PCR (Flu A&B, Covid) Nasopharyngeal Swab     Status: None   Collection Time: 08/08/2020 12:58 PM   Specimen: Nasopharyngeal Swab; Nasopharyngeal(NP) swabs in vial transport medium  Result Value Ref Range Status   SARS Coronavirus 2 by RT PCR NEGATIVE NEGATIVE Final    Comment: (NOTE) SARS-CoV-2  target nucleic acids are NOT DETECTED.  The SARS-CoV-2 RNA is generally detectable in upper respiratory specimens during the acute phase of infection. The lowest concentration of SARS-CoV-2 viral copies this assay can detect is 138 copies/mL. A negative result does not preclude SARS-Cov-2 infection and should not be used as the sole basis for treatment or other patient management decisions. A negative result may occur with  improper specimen collection/handling, submission of specimen other than nasopharyngeal swab, presence of viral mutation(s) within the areas targeted by this assay, and inadequate number of viral copies(<138 copies/mL). A negative result must be combined with clinical observations, patient history, and epidemiological information. The expected result is Negative.  Fact Sheet for Patients:  EntrepreneurPulse.com.au  Fact Sheet for Healthcare Providers:  IncredibleEmployment.be  This test is no t yet approved or cleared by the [place] FDA and  has been authorized for detection and/or diagnosis of SARS-CoV-2 by FDA under an Emergency Use Authorization (EUA). This EUA will remain  in effect (meaning this test can be used) for the duration of the COVID-19 declaration under Section 564(b)(1) of the Act, 21 U.S.C.section 360bbb-3(b)(1), unless the authorization is terminated  or revoked sooner.       Influenza A by PCR NEGATIVE NEGATIVE Final   Influenza B by PCR NEGATIVE NEGATIVE Final    Comment: (NOTE) The Xpert Xpress SARS-CoV-2/FLU/RSV plus assay is intended as an aid in the diagnosis of influenza from Nasopharyngeal swab specimens and should not be used as a sole basis for treatment. Nasal washings and aspirates are unacceptable for Xpert Xpress SARS-CoV-2/FLU/RSV testing.  Fact Sheet for Patients: EntrepreneurPulse.com.au  Fact Sheet for Healthcare  Providers: IncredibleEmployment.be  This test is not yet approved or cleared by the [place] FDA and has been authorized for detection and/or diagnosis of SARS-CoV-2 by FDA under an Emergency Use Authorization (EUA). This EUA will remain in effect (meaning this test can be used) for the duration of the COVID-19 declaration under Section 564(b)(1) of the Act, 21 U.S.C. section 360bbb-3(b)(1), unless the authorization is terminated or revoked.  Performed at [facility], [place] [address], [place], [place] [address]   Culture, blood (routine x 2)     Status: None   Collection Time: 07/28/2020  3:25 PM   Specimen: BLOOD  Result Value Ref Range Status   Specimen Description BLOOD RIGHT ANTECUBITAL  Final   Special Requests   Final    BOTTLES DRAWN AEROBIC AND ANAEROBIC Blood Culture adequate volume   Culture   Final    NO GROWTH 5 DAYS Performed at [facility], [place] [address], [place], [place] [address]    Report Status 08/11/2020 FINAL  Final  Culture, blood (routine x 2)     Status: None   Collection Time: 07/25/2020  3:38 PM   Specimen: BLOOD  Result Value Ref Range Status   Specimen Description BLOOD SITE NOT SPECIFIED  Final   Special Requests   Final    BOTTLES DRAWN AEROBIC AND ANAEROBIC Blood Culture adequate volume   Culture   Final    NO GROWTH 5 DAYS Performed at [facility], [place] [address], [place], [place] [address]  Report Status 08/11/2020 FINAL  Final  MRSA Next Gen by PCR, Nasal     Status: None   Collection Time: 07/25/2020  4:31 PM   Specimen: Nasal Mucosa; Nasal Swab  Result Value Ref Range Status   MRSA by PCR Next Gen NOT DETECTED NOT DETECTED Final    Comment: (NOTE) The GeneXpert MRSA Assay (FDA approved for NASAL specimens only), is one component of a comprehensive MRSA colonization surveillance program. It is not intended to diagnose MRSA infection nor to guide or monitor treatment for MRSA infections. Test  performance is not FDA approved in patients less than 73 years old. Performed at [facility], [place] [address], [place], [place] [address]     Lipid Panel No results for input(s): CHOL, TRIG, HDL, CHOLHDL, VLDL, LDLCALC in the last 72 hours.  Studies/Results: DG Chest Port 1 View  Result Date: 08/13/2020 CLINICAL DATA:  Acute respiratory failure. EXAM: PORTABLE CHEST 1 VIEW COMPARISON:  08/12/2020. FINDINGS: Endotracheal tube, NG tube, right IJ line in stable position. Cardiomegaly again noted. Low lung volumes again noted. Bilateral pulmonary infiltrates/edema with slight interim progression. Small left pleural effusion. No pneumothorax. IMPRESSION: 1.  Lines and tubes in stable position. 2.  Cardiomegaly again noted. 3. Low lung volumes. Bilateral pulmonary infiltrates/edema noted with slight interim progression from prior exam. Small left pleural effusion. Electronically Signed   By: Marcello Moores  Register   On: 08/13/2020 07:20   DG CHEST PORT 1 VIEW  Result Date: 08/12/2020 CLINICAL DATA:  Central line placement EXAM: PORTABLE CHEST 1 VIEW COMPARISON:  Radiograph 08/11/2020, CT 03/12/2019 FINDINGS: Endotracheal tube overlies the upper thoracic trachea. There is a nasogastric tube which passes below the diaphragm, tip excluded by collimation. There is a new right hip roach central venous catheter with tip overlying the distal superior vena cava. Unchanged, enlarged cardiomediastinal silhouette. Unchanged diffuse interstitial and basilar predominant airspace opacities. No visible pneumothorax. No large pleural effusion. No acute osseous abnormality. IMPRESSION: Right neck central venous catheter tip overlies the distal superior vena cava. Unchanged cardiomegaly and mild edema. Electronically Signed   By: Maurine Simmering   On: 08/12/2020 17:31    Medications: Scheduled:  aspirin  81 mg Per Tube Daily   chlorhexidine gluconate (MEDLINE KIT)  15 mL Mouth Rinse BID   Chlorhexidine Gluconate Cloth  6  each Topical Daily   darbepoetin (ARANESP) injection - DIALYSIS  60 mcg Intravenous Q Mon-HD   docusate  100 mg Per Tube BID   famotidine  10 mg Per Tube Daily   feeding supplement (PROSource TF)  45 mL Per Tube 5 X Daily   heparin injection (subcutaneous)  5,000 Units Subcutaneous Q8H   insulin aspart  0-15 Units Subcutaneous Q4H   insulin aspart  5 Units Subcutaneous Q4H   mouth rinse  15 mL Mouth Rinse 10 times per day   midodrine  10 mg Per Tube Q8H   multivitamin  1 tablet Per Tube Daily   polyethylene glycol  17 g Per Tube BID   rosuvastatin  20 mg Per Tube Daily   sennosides  5 mL Per Tube BID   sodium chloride flush  10-40 mL Intracatheter Q12H   ticagrelor  90 mg Per Tube BID   Continuous:   prismasol BGK 4/2.5 400 mL/hr at 08/13/20 2024    prismasol BGK 4/2.5 200 mL/hr at 08/13/20 2025   sodium chloride Stopped (08/07/20 1647)   sodium chloride 250 mL (08/07/20 1750)   sodium chloride 10 mL/hr at  08/14/20 0500   sodium chloride 10 mL/hr at 08/14/20 0700   dexmedetomidine (PRECEDEX) IV infusion 0.8 mcg/kg/hr (08/14/20 0700)   feeding supplement (VITAL 1.5 CAL) 1,000 mL (08/14/20 0458)   norepinephrine (LEVOPHED) Adult infusion 6 mcg/min (08/14/20 0700)   prismasol BGK 4/2.5 1,800 mL/hr at 08/14/20 0505    Imaging   08/07/20 CT head w/o contrast: No acute findings   08/08/20 brain MRI: No acute abnormalities. Mild chronic microvascular ischemic disease.   07/31/2020 EEG: profound diffuse encephalopathy. No seizure or epileptiform discharges 08/07/20 EEG: severe diffuse encephalopathy. No seizure or epileptiform discharges   08/07/20 echocardiogram: LVEF 45-50%; LV regional wall motion abnormalities with apical hypokinesis; mild LVH; Grade I diastolic dysfunction Mild RV systolic dysfunction; mild RV enlargement No major valvular abnormalities   Assessment: 55 year old female with ESRD on HD, HFpEF, CAD s/p NSTEMI, and cirrhosis who [facility]  after PEA arrest with 10 minutes of resuscitation efforts prior to ROSC. Her mental status has failed to improve despite being off of sedation, so Neurology was consulted for further evaluation. -Exam today is not significantly improved from Monday; however, based on discussion with care team, sounds like she had a better exam prior to restarting precedex--following commands.  -Suspect anoxic brain injury s/p PEA arrest. It is likely that she had poor cardiovascular reserve in the setting of her multiple other comorbidies including anemia and heart failure, which can explain anoxic brain injury despite relatively short resuscitation time prior to ROSC. -Metabolic causes, including uremia and hepatic encephalopathy could be contributing as well, given the somewhat atypical appearance of her exam for anoxic encephalopathy. It should be noted, however, that anoxia is highly likely to be playing a significant role. -Liver cirrhosis is also noted on her problem list and abdominal ultrasound from 2020 is consistent with this. Lactulose listed on her med list from her HD center however unclear if she was taking this at home. ALT mildly elevated on admission. May consider hepatic encephalopathy. - EEG x 2 were negative for electrographic seizures, but consistent with severe encephalopathy. - CT and brain MRI are normal  - Ammonia of 38 elevated slightly above the upper level of normal (drawn on 7/26)     Recommendations: 1. Continue supportive care. 2. Periodically hold sedation to reassess neurological status. 3. Consider checking an ammonia level and starting lactulose.  4. CRRT and ESA w/darbepoietin rx for anemia per Nephrology   LOS: 8 days   Mitzi Hansen, MD Internal Medicine Resident PGY-3 [facility] Pager: [phone] 08/14/2020 8:37 AM     35 minutes spent in the neurological evaluation and management of this critically ill patient. Prognosis discussed with  family. Resident present for instructional purposes.    Electronically signed: Dr. Kerney Elbe

## 2020-08-14 NOTE — Consult Note (Signed)
Consultation Note Date: 08/14/2020   Patient Name: Brenda Davis  DOB: [birth year]  MRN: [id number]  Age / Sex: 55 y.o., female  PCP: [name], MD Referring Physician: Juanito Doom, MD  Reason for Consultation: Establishing goals of care and Psychosocial/spiritual support  HPI/Patient Profile: 55 y.o. female  admitted on 08/17/2020  with end-stage renal disease on dialysis, coronary artery disease, hypertension diabetes and hyperlipidemia who 1 underwent planned EGD and colonoscopy for persistent nausea and vomiting, during procedure patient suffered PEA cardiac arrest due to unknown etiology.    ROSC was achieved after 10 minutes of ACLS protocol.    Patient was intubated in route to the hospital.  Today is day 8 of this hospital stay, she remains intubated, she is weaning on pressure support today, she is on CRRT.  She is showing some signs of improvement.  Family face treatment option decision, advanced directive decisions and anticipatory care needs.       Clinical Assessment and Goals of Care:  This NP Wadie Lessen reviewed medical records, received report from team, assessed the patient and then spoke to patient's husband by phone  to discuss diagnosis, prognosis, GOC, EOL wishes disposition and options.  Patient's mother and sister are at bedside.   Introduced palliative medicine and role in a holistic treatment plan.  Patient's mother is tangential in thoughts and questions.  Addressed her questions and offered support   Concept of Palliative Care was introduced as specialized medical care for people and their families living with serious illness.  If focuses on providing relief from the symptoms and stress of a serious illness.  The goal is to improve quality of life for both the patient and the family.  Values and goals of care important to patient and family were  attempted to be elicited.  Created space and opportunity for husband to explore thoughts and feelings regarding current medical situation.    Mr Marylene Land an understanding of the seriousness of his wife's current medical  situation.  This has been a very difficult time for the entire family.  They have two sons and a daughter.  All remain hopeful for improvement.   Emotional support offered  Family is open to all offered and available medical interventions to prolong life.   Questions and concerns addressed.      Dicussed with husband the importance of continued conversation with his family and the  medical providers regarding overall plan of care and treatment options,  ensuring decisions are within the context of the patients values and GOCs.  A family meeting is scheduled for tomorrow at 2pm with large family, Dr Lake Bells notified and will attend.   Front desk is notified of need to liberalize visitation for family meeting   No documented HPOA or ACP documents.  Husband is next of kin      SUMMARY OF RECOMMENDATIONS    Code Status/Advance Care Planning: Full code   Symptom Management:  Per Attending  Palliative Prophylaxis:  Aspiration, Bowel Regimen, Delirium Protocol,  Frequent Pain Assessment, and Oral Care  Additional Recommendations (Limitations, Scope, Preferences): Full Scope Treatment  Psycho-social/Spiritual:  Desire for further Chaplaincy support:   will discuss with family tomorrow    Prognosis:  Unable to determine  Discharge Planning: To Be Determined      Primary Diagnoses: Present on Admission:  Cardiac arrest [facility])   I have reviewed the medical record, interviewed the patient and family, and examined the patient. The following aspects are pertinent.  Past Medical History:  Diagnosis Date   Anemia    CAD (coronary artery disease)    a. cath in 02/2018 showing moderate 3-vessel CAD with 45% mid-LADm 55% LCx and 65% RCA stenosis which  was not significant by FFR   ESRD on dialysis ([place])    Gout    HCAP (healthcare-associated pneumonia) 05/06/2016   History of blood transfusion    "related to surgery"   Hyperlipidemia    Hypertension    Morbid obesity ([place])    Pain    LEFT SHOULDER PAIN - WAS SEEN AT AN URGENT CARE - GIVEN SLING FOR COMFORT AND TOLD ROM AS TOLERATED.   Palpitations 09/24/2016   Peritonitis, dialysis-associated (HCC)    SVT (supraventricular tachycardia) ([place]) 02/28/2020   Type II diabetes mellitus ([place])    "gastric sleeve OR corrected this" (05/06/2016)   Social History   Socioeconomic History   Marital status: Married    Spouse name: Not on file   Number of children: 3   Years of education: Not on file   Highest education level: Not on file  Occupational History   Occupation: customer service    Employer: [employer]  Tobacco Use   Smoking status: Former    Packs/day: 1.00    Years: 16.00    Pack years: 16.00    Types: Cigarettes    Quit date: 01/19/2001    Years since quitting: 19.5   Smokeless tobacco: Never  Vaping Use   Vaping Use: Never used  Substance and Sexual Activity   Alcohol use: Never   Drug use: No   Sexual activity: Yes    Birth control/protection: Post-menopausal  Other Topics Concern   Not on file  Social History Narrative   Not on file   Social Determinants of Health   Financial Resource Strain: Not on file  Food Insecurity: Not on file  Transportation Needs: Not on file  Physical Activity: Not on file  Stress: Not on file  Social Connections: Not on file   Family History  Problem Relation Age of Onset   Hypertension Mother    Mitral valve prolapse Mother    Diabetes Father    Arthritis Other    Hyperlipidemia Other    Cancer Neg Hx    Heart disease Neg Hx    Kidney disease Neg Hx    Stroke Neg Hx    Scheduled Meds:  aspirin  81 mg Per Tube Daily   Chlorhexidine Gluconate Cloth  6 each Topical Daily   darbepoetin (ARANESP) injection - DIALYSIS  60 mcg  Intravenous Q Mon-HD   famotidine  10 mg Per Tube Daily   heparin injection (subcutaneous)  5,000 Units Subcutaneous Q8H   insulin aspart  0-15 Units Subcutaneous Q4H   insulin aspart  5 Units Subcutaneous Q4H   midodrine  10 mg Per Tube Q8H   multivitamin  1 tablet Per Tube Daily   polyethylene glycol  17 g Per Tube BID   rosuvastatin  20 mg Per Tube Daily  sennosides  5 mL Per Tube BID   sodium chloride flush  10-40 mL Intracatheter Q12H   ticagrelor  90 mg Per Tube BID   Continuous Infusions:   prismasol BGK 4/2.5 400 mL/hr at 08/14/20 0943    prismasol BGK 4/2.5 200 mL/hr at 08/13/20 2025   sodium chloride Stopped (08/07/20 1647)   sodium chloride 250 mL (08/07/20 1750)   sodium chloride 10 mL/hr at 08/14/20 0500   sodium chloride 10 mL/hr at 08/14/20 1200   feeding supplement (VITAL 1.5 CAL) 1,000 mL (08/14/20 0458)   norepinephrine (LEVOPHED) Adult infusion 3 mcg/min (08/14/20 1200)   prismasol BGK 4/2.5 1,800 mL/hr at 08/14/20 0505   PRN Meds:.sodium chloride, Place/Maintain arterial line **AND** sodium chloride, alteplase, docusate, fentaNYL (SUBLIMAZE) injection, heparin, heparin, hydrALAZINE, sodium chloride, sodium chloride flush Medications Prior to Admission:  Prior to Admission medications   Medication Sig Start Date End Date Taking? Authorizing Provider  acetaminophen (TYLENOL) 500 MG tablet Take 1,000 mg by mouth every 6 (six) hours as needed for moderate pain or headache.   Yes [provider]  B Complex-C-Biotin-D-FA (DIALYVITE 800 PLUS D) 800 MCG WAFR Take by mouth. 09/04/19  Yes [provider]  calcium acetate (PHOSLO) 667 MG capsule Take 667-1,334 mg by mouth See admin instructions. Take 1334 mg with each meal and 667 mg with each snack 06/02/18  Yes [provider]  carvedilol (COREG) 25 MG tablet Take 1 tablet (25 mg total) by mouth 2 (two) times daily. 04/23/20  Yes Skeet Latch, MD  cinacalcet (SENSIPAR) 30 MG tablet Take 30 mg by  mouth 3 (three) times a week. Mon, Wed, Fri 04/11/20  Yes [provider]  diltiazem (CARDIZEM CD) 120 MG 24 hr capsule Take 1 capsule (120 mg total) by mouth daily. 02/28/20 08/09/20 Yes Skeet Latch, MD  gabapentin (NEURONTIN) 100 MG capsule Take 100 mg by mouth 2 (two) times daily. 10/31/18  Yes [provider]  isosorbide mononitrate (IMDUR) 30 MG 24 hr tablet Take 1 tablet (30 mg total) by mouth daily. 04/24/20 08/09/20 Yes Cleaver, Jossie Ng, NP  nitroGLYCERIN (NITROSTAT) 0.4 MG SL tablet Place 1 tablet (0.4 mg total) under the tongue every 5 (five) minutes as needed for chest pain. 07/26/20  Yes Skeet Latch, MD  ondansetron (ZOFRAN) 4 MG tablet Take 1 tablet (4 mg total) by mouth every 8 (eight) hours as needed for nausea or vomiting. 0/96/28  Yes Delora Fuel, MD  rosuvastatin (CRESTOR) 20 MG tablet Take 1 tablet (20 mg total) by mouth daily. 04/23/20  Yes Skeet Latch, MD  ticagrelor (BRILINTA) 90 MG TABS tablet Take 1 tablet (90 mg total) by mouth 2 (two) times daily. 05/28/20  Yes Skeet Latch, MD  calcitRIOL (ROCALTROL) 0.5 MCG capsule Take 1.5 mcg by mouth daily.  04/29/19   [provider]  thiamine (VITAMIN B-1) 50 MG tablet Take 1 tablet (50 mg total) by mouth daily. Patient not taking: No sig reported 03/01/20   [name], MD   Allergies  Allergen Reactions   Amlodipine Swelling   Atorvastatin     Elevated LFT's   Clonidine Derivatives Swelling    Limbs swell   Doxycycline Nausea And Vomiting    "I threw up for 3 hours"   Welchol [Colesevelam Hcl] Nausea Only   Review of Systems  Unable to perform ROS: Intubated   Physical Exam Constitutional:      Appearance: She is obese.     Interventions: She is intubated.  Cardiovascular:  Rate and Rhythm: Normal rate.  Pulmonary:     Effort: She is intubated.  Skin:    General: Skin is warm and dry.    Vital Signs: BP (!) 109/50   Pulse 67   Temp 97.6 F (36.4 C) (Oral)   Resp  20   Ht 5' 5"  (1.651 m)   Wt 124.2 kg   SpO2 100%   BMI 45.56 kg/m  Pain Scale: CPOT       SpO2: SpO2: 100 % O2 Device:SpO2: 100 % O2 Flow Rate: .   IO: Intake/output summary:  Intake/Output Summary (Last 24 hours) at 08/14/2020 1254 Last data filed at 08/14/2020 1200 Gross per 24 hour  Intake 2600.26 ml  Output 2767 ml  Net -166.74 ml    LBM: Last BM Date: 08/13/20 Baseline Weight: Weight: 123.4 kg Most recent weight: Weight: 124.2 kg     Palliative Assessment/Data:     Discussed with Dr Lake Bells vis secure chat  Time In: 1200 Time Out: 1310 Time Total: 70 minutes Greater than 50%  of this time was spent counseling and coordinating care related to the above assessment and plan.  Signed by: Wadie Lessen, NP   Please contact Palliative Medicine Team phone at [phone] for questions and concerns.  For individual provider: See Shea Evans

## 2020-08-14 NOTE — Procedures (Signed)
Extubation Procedure Note  Patient Details:   Name: Brenda Davis DOB: 02/07/1965 MRN: [id number]   Airway Documentation:    Vent end date: 08/14/20 Vent end time: 1045   Evaluation  O2 sats: stable throughout Complications: No apparent complications Patient did tolerate procedure well. Bilateral Breath Sounds: Clear, Diminished   Yes  RT extubated patient to 4L [place] per MD order with RN at bedside. Positive cuff leak noted. Patient tolerated well and does not seem to be in distress at this time. Vital signs stable and no stridor noted.  RT will continue to monitor as needed.   Fabiola Backer 08/14/2020, 12:33 PM

## 2020-08-14 NOTE — Procedures (Signed)
Cortrak  Person Inserting Tube:  Sherree Shankman, Creola Corn, RD Tube Type:  Cortrak - 43 inches Tube Size:  10 Tube Location:  Right nare Initial Placement:  Stomach Secured by: Bridle Technique Used to Measure Tube Placement:  Marking at nare/corner of mouth Cortrak Secured At:  66 cm  Cortrak Tube Team Note:  Consult received to place a Cortrak feeding tube.   X-ray is required, abdominal x-ray has been ordered by the Cortrak team. Please confirm tube placement before using the Cortrak tube.   If the tube becomes dislodged please keep the tube and contact the Cortrak team at [email or web address] (password [email or web address]) for replacement.  If after hours and replacement cannot be delayed, place a NG tube and confirm placement with an abdominal x-ray.    Larkin Ina, MS, RD, LDN (she/her/hers) RD pager number and weekend/on-call pager number located in [place].

## 2020-08-14 NOTE — Progress Notes (Signed)
RT placed 6.0 nasal trumpet down patient left nostril per MD. Patient tolerated well. RT will continue to monitor.

## 2020-08-14 NOTE — Progress Notes (Signed)
E-link made aware about concern for air way protection despite a nasal trumpet in place, and providing supplemental oxygen. Patient is not able to manage secretions which requires frequent suctioning. Not able to tolerate NT suctioning. Current oxygen saturations 100% and RR around low 20's. On precedex for agitation. Will titrate according to the new titration order. Will continue to monitor closely.

## 2020-08-14 NOTE — Progress Notes (Signed)
     Progress Note  Courtesy visit (GI)  ASSESSMENT AND PLAN:   Patient extubated Much alert today Family (mother, sister and pastor) by bedside Pleased with the progress. We prayed together.   Supportive care   OBJECTIVE:     Vital signs in last 24 hours: Temp:  [94.4 F (34.7 C)-98.8 F (37.1 C)] 97.6 F (36.4 C) (07/27 0724) Pulse Rate:  [53-68] 67 (07/27 1115) Resp:  [16-26] 26 (07/27 1115) BP: (72-115)/(36-86) 109/50 (07/26 2334) SpO2:  [98 %-100 %] 100 % (07/27 1115) Arterial Line BP: (91-124)/(39-58) 100/42 (07/27 1115) FiO2 (%):  [40 %] 40 % (07/27 0816) Weight:  [124.2 kg] 124.2 kg (07/27 0500) Last BM Date: 08/13/20 General:   Alert,   Intake/Output from previous day: 07/26 0701 - 07/27 0700 In: 2657.5 [I.V.:1114.7; NG/GT:1542.8] Out: 2547  Intake/Output this shift: Total I/O In: 502.7 [I.V.:182.7; NG/GT:320] Out: 416 [Other:416]  Lab Results: Recent Labs    08/12/20 1728 08/13/20 0329 08/14/20 0307  WBC 13.7* 12.4* 10.6*  HGB 7.6* 7.8* 7.2*  HCT 22.8* 24.2* 22.3*  PLT 233 225 250   BMET Recent Labs    08/13/20 0313 08/13/20 1504 08/14/20 0307  NA 133* 134* 135  K 4.8 4.9 4.9  CL 97* 99 99  CO2 23 26 27   GLUCOSE 256* 171* 189*  BUN 86* 62* 46*  CREATININE 6.77* 4.87* 3.38*  CALCIUM 8.4* 8.6* 8.6*   LFT Recent Labs    08/14/20 0307  ALBUMIN 2.1*   PT/INR No results for input(s): LABPROT, INR in the last 72 hours. Hepatitis Panel No results for input(s): HEPBSAG, HCVAB, HEPAIGM, HEPBIGM in the last 72 hours.  DG Chest Port 1 View  Result Date: 08/13/2020 CLINICAL DATA:  Acute respiratory failure. EXAM: PORTABLE CHEST 1 VIEW COMPARISON:  08/12/2020. FINDINGS: Endotracheal tube, NG tube, right IJ line in stable position. Cardiomegaly again noted. Low lung volumes again noted. Bilateral pulmonary infiltrates/edema with slight interim progression. Small left pleural effusion. No pneumothorax. IMPRESSION: 1.  Lines and tubes in  stable position. 2.  Cardiomegaly again noted. 3. Low lung volumes. Bilateral pulmonary infiltrates/edema noted with slight interim progression from prior exam. Small left pleural effusion. Electronically Signed   By: Marcello Moores  Register   On: 08/13/2020 07:20   DG CHEST PORT 1 VIEW  Result Date: 08/12/2020 CLINICAL DATA:  Central line placement EXAM: PORTABLE CHEST 1 VIEW COMPARISON:  Radiograph 08/11/2020, CT 03/12/2019 FINDINGS: Endotracheal tube overlies the upper thoracic trachea. There is a nasogastric tube which passes below the diaphragm, tip excluded by collimation. There is a new right hip roach central venous catheter with tip overlying the distal superior vena cava. Unchanged, enlarged cardiomediastinal silhouette. Unchanged diffuse interstitial and basilar predominant airspace opacities. No visible pneumothorax. No large pleural effusion. No acute osseous abnormality. IMPRESSION: Right neck central venous catheter tip overlies the distal superior vena cava. Unchanged cardiomegaly and mild edema. Electronically Signed   By: Maurine Simmering   On: 08/12/2020 17:31     Active Problems:   Encounter for central line placement   Cardiac arrest ([place])     LOS: 8 days     Carmell Austria, MD 08/14/2020, 11:23 AM [facility] [phone]

## 2020-08-14 NOTE — Progress Notes (Addendum)
Patient ID: Brenda Davis, female   DOB: 15-May-1965, 55 y.o.   MRN: [id number] S: I/O even yesterday, extubated this am.  CXR yest showing some mild edema  O:BP (!) 109/50   Pulse 62   Temp 97.6 F (36.4 C) (Oral)   Resp 20   Ht 5' 5"  (1.651 m)   Wt 124.2 kg   SpO2 100%   BMI 45.56 kg/m   Intake/Output Summary (Last 24 hours) at 08/14/2020 1108 Last data filed at 08/14/2020 1000 Gross per 24 hour  Intake 2535.3 ml  Output 2495 ml  Net 40.3 ml    Intake/Output: I/O last 3 completed shifts: In: 3391.9 [I.V.:1804.1; NG/GT:1587.8] Out: 4854 [Other:3446]  Intake/Output this shift:  Total I/O In: 381.5 [I.V.:161.5; NG/GT:220] Out: 236 [Other:236] Weight change: 0.2 kg OEV:[id number] CVS:RRR Resp: ventilated BS bilaterally Abd: +BS, soft, NT/ND Ext: no edema, LUE AVF +T/B    Iron Studies:  Recent Labs    08/13/20 0329  IRON 25*  TIBC 196*    Studies/Results: DG Chest Port 1 View  Result Date: 08/13/2020 CLINICAL DATA:  Acute respiratory failure. EXAM: PORTABLE CHEST 1 VIEW COMPARISON:  08/12/2020. FINDINGS: Endotracheal tube, NG tube, right IJ line in stable position. Cardiomegaly again noted. Low lung volumes again noted. Bilateral pulmonary infiltrates/edema with slight interim progression. Small left pleural effusion. No pneumothorax. IMPRESSION: 1.  Lines and tubes in stable position. 2.  Cardiomegaly again noted. 3. Low lung volumes. Bilateral pulmonary infiltrates/edema noted with slight interim progression from prior exam. Small left pleural effusion. Electronically Signed   By: Marcello Moores  Register   On: 08/13/2020 07:20   DG CHEST PORT 1 VIEW  Result Date: 08/12/2020 CLINICAL DATA:  Central line placement EXAM: PORTABLE CHEST 1 VIEW COMPARISON:  Radiograph 08/11/2020, CT 03/12/2019 FINDINGS: Endotracheal tube overlies the upper thoracic trachea. There is a nasogastric tube which passes below the diaphragm, tip excluded by collimation. There is a new right  hip roach central venous catheter with tip overlying the distal superior vena cava. Unchanged, enlarged cardiomediastinal silhouette. Unchanged diffuse interstitial and basilar predominant airspace opacities. No visible pneumothorax. No large pleural effusion. No acute osseous abnormality. IMPRESSION: Right neck central venous catheter tip overlies the distal superior vena cava. Unchanged cardiomegaly and mild edema. Electronically Signed   By: Maurine Simmering   On: 08/12/2020 17:31    aspirin  81 mg Per Tube Daily   Chlorhexidine Gluconate Cloth  6 each Topical Daily   darbepoetin (ARANESP) injection - DIALYSIS  60 mcg Intravenous Q Mon-HD   famotidine  10 mg Per Tube Daily   heparin injection (subcutaneous)  5,000 Units Subcutaneous Q8H   insulin aspart  0-15 Units Subcutaneous Q4H   insulin aspart  5 Units Subcutaneous Q4H   midodrine  10 mg Per Tube Q8H   multivitamin  1 tablet Per Tube Daily   polyethylene glycol  17 g Per Tube BID   rosuvastatin  20 mg Per Tube Daily   sennosides  5 mL Per Tube BID   sodium chloride flush  10-40 mL Intracatheter Q12H   ticagrelor  90 mg Per Tube BID    Dialysis Orders: Center: [facility]  on 4 treatments/week   124kg  2/2.5 bath  3.5h   Hep 5000   LUE AVF  400/600     Assessment/Plan:  PEA arrest - s/p ACLS with ROSC in 10 minutes. ECHO with EF of 45-50% and Grade I DD. Per CCM.  Acute hypoxic respiratory failure - s/p  PEA arrest w/ intubation in the field - possible aspiration.  Per CCM.   ESRD - on HD 4x per wk at home. Plan MWF here. Is now on CRRT started 7/25 due to shock.    HypOtension - on low-dose pressors and midodrine started at 87m tid per NG.  Vol - stable at this time on exam, no edema, at dry wt, have d/w CCM, no UF needed for now. Will keep +25 cc/hr.   Anemia  - Hgb 11 on admit, down to 6.7 and sp 1u prbc yest 7/26. Started ESA w/ darbe 60ug weekly on monday.   Metabolic bone disease -  hold til taking po  Nutrition - npo for  now. Getting tube feeds H/o CAD s/p NSTEMI PCI and DES DM type II - per primary. Acute metabolic encephalopathy - EEG w/o seizures. MRI without evidence of anoxic brain injury. More responsive today.    RKelly Splinter MD 08/14/2020, 11:08 AM

## 2020-08-14 NOTE — Progress Notes (Signed)
Nutrition Follow-up  DOCUMENTATION CODES:   Morbid obesity  INTERVENTION:   Resume tube feeding when Cortrak tube is placed: Vital 1.5 at 50 ml/h (1200 ml per day) Prosource TF 45 ml 5 times per day  Provides 2000 kcal, 136 gm protein, 917 ml free water daily  NUTRITION DIAGNOSIS:   Inadequate oral intake related to inability to eat as evidenced by NPO status.  Ongoing  GOAL:   Provide needs based on ASPEN/SCCM guidelines  Met with TF  MONITOR:   Vent status, Labs, TF tolerance  REASON FOR ASSESSMENT:   Ventilator, Consult Enteral/tube feeding initiation and management  ASSESSMENT:   55 yo female admitted S/P PEA cardiac arrest during outpatient endoscopy procedure. PMH includes ESRD on HD at home 4 days per week, CAD, HTN, HLD, DM-2.  Discussed patient in ICU rounds and with RN today. Patient remains on CRRT; keeping at +25 cc/hr. Patient was extubated this morning. OG tube removed, so TF is off. Plans to place Cortrak tube today to continue TF.  RN does not expect patient to be able to take PO's safely today.  SLP following for ability to advance diet.   Labs reviewed. Mag 2.7 CBG: 180-181  Medications reviewed and include levophed, aranesp, pepcid, novolog, rena-vit, miralax, senokot.  EDW 124 kg Currently 124.2 kg  I/O +6.4 L since admission  Diet Order:   Diet Order             Diet NPO time specified  Diet effective now                   EDUCATION NEEDS:   Not appropriate for education at this time  Skin:  Skin Assessment: Reviewed RN Assessment  Last BM:  7/26 type 6  Height:   Ht Readings from Last 1 Encounters:  08/18/2020 5' 5"  (1.651 m)    Weight:   Wt Readings from Last 1 Encounters:  08/14/20 124.2 kg    Ideal Body Weight:  56.8 kg  BMI:  Body mass index is 45.56 kg/m.  Estimated Nutritional Needs:   Kcal:  2000-2200  Protein:  120-140 gm  Fluid:  1 L + UOP    Lucas Mallow, RD, LDN, CNSC Please refer to  Amion for contact information.

## 2020-08-15 DIAGNOSIS — J9601 Acute respiratory failure with hypoxia: Secondary | ICD-10-CM | POA: Diagnosis not present

## 2020-08-15 DIAGNOSIS — I469 Cardiac arrest, cause unspecified: Secondary | ICD-10-CM | POA: Diagnosis not present

## 2020-08-15 DIAGNOSIS — G931 Anoxic brain damage, not elsewhere classified: Secondary | ICD-10-CM | POA: Diagnosis not present

## 2020-08-15 DIAGNOSIS — Z66 Do not resuscitate: Secondary | ICD-10-CM | POA: Diagnosis not present

## 2020-08-15 DIAGNOSIS — N186 End stage renal disease: Secondary | ICD-10-CM | POA: Diagnosis not present

## 2020-08-15 DIAGNOSIS — R69 Illness, unspecified: Secondary | ICD-10-CM | POA: Diagnosis not present

## 2020-08-15 LAB — GLUCOSE, CAPILLARY
Glucose-Capillary: 155 mg/dL — ABNORMAL HIGH (ref 70–99)
Glucose-Capillary: 158 mg/dL — ABNORMAL HIGH (ref 70–99)
Glucose-Capillary: 163 mg/dL — ABNORMAL HIGH (ref 70–99)
Glucose-Capillary: 177 mg/dL — ABNORMAL HIGH (ref 70–99)
Glucose-Capillary: 197 mg/dL — ABNORMAL HIGH (ref 70–99)

## 2020-08-15 LAB — MAGNESIUM: Magnesium: 2.6 mg/dL — ABNORMAL HIGH (ref 1.7–2.4)

## 2020-08-15 LAB — CBC
HCT: 22.1 % — ABNORMAL LOW (ref 36.0–46.0)
Hemoglobin: 7 g/dL — ABNORMAL LOW (ref 12.0–15.0)
MCH: 31 pg (ref 26.0–34.0)
MCHC: 31.7 g/dL (ref 30.0–36.0)
MCV: 97.8 fL (ref 80.0–100.0)
Platelets: 253 10*3/uL (ref 150–400)
RBC: 2.26 MIL/uL — ABNORMAL LOW (ref 3.87–5.11)
RDW: 16.1 % — ABNORMAL HIGH (ref 11.5–15.5)
WBC: 8.7 10*3/uL (ref 4.0–10.5)
nRBC: 0.3 % — ABNORMAL HIGH (ref 0.0–0.2)

## 2020-08-15 LAB — RENAL FUNCTION PANEL
Albumin: 2.1 g/dL — ABNORMAL LOW (ref 3.5–5.0)
Anion gap: 4 — ABNORMAL LOW (ref 5–15)
BUN: 31 mg/dL — ABNORMAL HIGH (ref 6–20)
CO2: 27 mmol/L (ref 22–32)
Calcium: 8.8 mg/dL — ABNORMAL LOW (ref 8.9–10.3)
Chloride: 102 mmol/L (ref 98–111)
Creatinine, Ser: 2.2 mg/dL — ABNORMAL HIGH (ref 0.44–1.00)
GFR, Estimated: 26 mL/min — ABNORMAL LOW (ref >60)
Glucose, Bld: 184 mg/dL — ABNORMAL HIGH (ref 70–99)
Phosphorus: 3.2 mg/dL (ref 2.5–4.6)
Potassium: 5.2 mmol/L — ABNORMAL HIGH (ref 3.5–5.1)
Sodium: 133 mmol/L — ABNORMAL LOW (ref 135–145)

## 2020-08-15 LAB — APTT: aPTT: 43 seconds — ABNORMAL HIGH (ref 24–36)

## 2020-08-15 MED ORDER — GLYCOPYRROLATE 0.2 MG/ML IJ SOLN
0.2000 mg | INTRAMUSCULAR | Status: DC | PRN
  Administered 2020-08-15: 0.2 mg via INTRAVENOUS
  Filled 2020-08-15: qty 1

## 2020-08-15 MED ORDER — HEPARIN SODIUM (PORCINE) 1000 UNIT/ML DIALYSIS
1000.0000 [IU] | INTRAMUSCULAR | Status: DC | PRN
  Administered 2020-08-15: 1000 [IU] via INTRAVENOUS_CENTRAL
  Filled 2020-08-15: qty 3
  Filled 2020-08-15 (×2): qty 6

## 2020-08-15 MED ORDER — ORAL CARE MOUTH RINSE
15.0000 mL | Freq: Two times a day (BID) | OROMUCOSAL | Status: DC
  Administered 2020-08-15 (×3): 15 mL via OROMUCOSAL

## 2020-08-15 MED ORDER — CHLORHEXIDINE GLUCONATE 0.12 % MT SOLN
15.0000 mL | Freq: Two times a day (BID) | OROMUCOSAL | Status: DC
  Administered 2020-08-15: 15 mL via OROMUCOSAL
  Filled 2020-08-15: qty 15

## 2020-08-15 MED ORDER — GABAPENTIN 250 MG/5ML PO SOLN
100.0000 mg | Freq: Two times a day (BID) | ORAL | Status: DC
  Administered 2020-08-15: 100 mg
  Filled 2020-08-15 (×2): qty 2

## 2020-08-18 DIAGNOSIS — Z992 Dependence on renal dialysis: Secondary | ICD-10-CM | POA: Diagnosis not present

## 2020-08-18 DIAGNOSIS — E1122 Type 2 diabetes mellitus with diabetic chronic kidney disease: Secondary | ICD-10-CM | POA: Diagnosis not present

## 2020-08-18 DIAGNOSIS — N186 End stage renal disease: Secondary | ICD-10-CM | POA: Diagnosis not present

## 2020-08-19 NOTE — Evaluation (Signed)
Clinical/Bedside Swallow Evaluation Patient Details  Name: Brenda Davis MRN: [id number] Date of Birth: [birth year]  Today's Date: August 22, 2020 Time: SLP Start Time (ACUTE ONLY): 43 SLP Stop Time (ACUTE ONLY): 1022 SLP Time Calculation (min) (ACUTE ONLY): 16 min  Past Medical History:  Past Medical History:  Diagnosis Date   Anemia    CAD (coronary artery disease)    a. cath in 02/2018 showing moderate 3-vessel CAD with 45% mid-LADm 55% LCx and 65% RCA stenosis which was not significant by FFR   ESRD on dialysis ([place])    Gout    HCAP (healthcare-associated pneumonia) 05/06/2016   History of blood transfusion    "related to surgery"   Hyperlipidemia    Hypertension    Morbid obesity ([place])    Pain    LEFT SHOULDER PAIN - WAS SEEN AT AN URGENT CARE - GIVEN SLING FOR COMFORT AND TOLD ROM AS TOLERATED.   Palpitations 09/24/2016   Peritonitis, dialysis-associated (HCC)    SVT (supraventricular tachycardia) ([place]) 02/28/2020   Type II diabetes mellitus ([place])    "gastric sleeve OR corrected this" (05/06/2016)   Past Surgical History:  Past Surgical History:  Procedure Laterality Date   AV FISTULA PLACEMENT Left 01/03/2015   Procedure: BRACHIAL CEPHALIC ARTERIOVENOUS  FISTULA CREATION LEFT ARM;  Surgeon: Angelia Mould, MD;  Location: [facility];  Service: Vascular;  Laterality: Left;   CARPAL TUNNEL RELEASE Right    [place]  09/14/2017   CORONARY STENT INTERVENTION N/A 01/17/2019   Procedure: CORONARY STENT INTERVENTION;  Surgeon: Jettie Booze, MD;  Location: [place] [facility];  Service: Cardiovascular;  Laterality: N/A;   ESOPHAGOGASTRODUODENOSCOPY N/A 09/04/2012   Procedure: ESOPHAGOGASTRODUODENOSCOPY (EGD);  Surgeon: Shann Medal, MD;  Location: [facility];  Service: General;  Laterality: N/A;  PF   ESOPHAGOGASTRODUODENOSCOPY (EGD) WITH ESOPHAGEAL DILATION N/A 09/29/2012   Procedure: ESOPHAGOGASTRODUODENOSCOPY (EGD) WITH  ESOPHAGEAL DILATION;  Surgeon: Milus Banister, MD;  Location: [facility];  Service: Endoscopy;  Laterality: N/A;   FEMORAL ARTERY EXPLORATION Right 01/17/2019   Procedure: Exploration Right Groin with Primary Closure or Arteriotomy Site; Washout of Retroperitoneal Hematoma;  Surgeon: Waynetta Sandy, MD;  Location: [facility];  Service: Vascular;  Laterality: Right;   INSERTION OF DIALYSIS CATHETER N/A 01/03/2015   Procedure: INSERTION OF DIALYSIS CATHETER RIGHT INTERNAL JUGULAR;  Surgeon: Angelia Mould, MD;  Location: [facility];  Service: Vascular;  Laterality: N/A;   LAPAROSCOPIC GASTRIC SLEEVE RESECTION N/A 07/19/2012   Procedure: LAPAROSCOPIC SLEEVE GASTRECTOMY with EGD;  Surgeon: Madilyn Hook, DO;  [facility]: [facility];  Service: General;  Laterality: N/A;  laparoscopic sleeve gastrectomy with EGD   LEFT HEART CATH AND CORONARY ANGIOGRAPHY N/A 02/25/2018   Procedure: LEFT HEART CATH AND CORONARY ANGIOGRAPHY;  Surgeon: Leonie Man, MD;  Location: [facility] [facility];  Service: Cardiovascular;  Laterality: N/A;   LEFT HEART CATH AND CORONARY ANGIOGRAPHY N/A 06/20/2018   Procedure: LEFT HEART CATH AND CORONARY ANGIOGRAPHY;  Surgeon: Nelva Bush, MD;  Location: [place] [facility];  Service: Cardiovascular;  Laterality: N/A;   LEFT HEART CATH AND CORONARY ANGIOGRAPHY N/A 01/16/2019   Procedure: LEFT HEART CATH AND CORONARY ANGIOGRAPHY;  Surgeon: Jettie Booze, MD;  Location: [facility];  Service: Cardiovascular;  Laterality: N/A;   LEFT HEART CATH AND CORONARY ANGIOGRAPHY N/A 03/03/2019   Procedure: LEFT HEART CATH AND CORONARY ANGIOGRAPHY;  Surgeon: Jettie Booze, MD;  Location: [facility];  Service: Cardiovascular;  Laterality: N/A;   PERIPHERAL VASCULAR BALLOON ANGIOPLASTY Left 08/18/2019   Procedure: PERIPHERAL VASCULAR BALLOON ANGIOPLASTY;  Surgeon: Marty Heck, MD;  Location: [place] [facility];  Service: Cardiovascular;  Laterality: Left;  arm  fistula   PERIPHERAL VASCULAR CATHETERIZATION Left 05/23/2015   Procedure: Nolon Stalls;  Surgeon: Conrad Weston, MD;  Location: [facility];  Service: Cardiovascular;  Laterality: Left;  upper aRM   PERIPHERAL VASCULAR CATHETERIZATION Left 05/23/2015   Procedure: Peripheral Vascular Balloon Angioplasty;  Surgeon: Conrad St. Charles, MD;  Location: [place];  Service: Cardiovascular;  Laterality: Left;  av fistula   TUBAL LIGATION  1993   UPPER GI ENDOSCOPY N/A 07/19/2012   Procedure: UPPER GI ENDOSCOPY;  Surgeon: Madilyn Hook, DO;  [facility];  Service: General;  Laterality: N/A;   HPI:  Pt is a 55 y.o. female who underwent planned EGD and colonoscopy for persistent nausea and vomiting and suffered PEA cardiac arrest during procedure. ROSC achieved after 10 minutes of ACLS protocol. Patient was intubated en route to the hospital. ETT 7/19-7/27. Pt on CRRT, EEG x 2 were negative for electrographic seizures, but consistent with severe encephalopathy, anoxic brain injury suspected. MRI brain negative for acute changes. Cortrak placed 7/27. PMT consulted and family meeting planned for 7/28 at 1400. PMH significant for ESRD on dialysis at home 4 days a week, CAD s/p PCI with DES, HTN, HLD, type 2 diabetes, morbid obesity, and anemia.   Assessment / Plan / Recommendation Clinical Impression  Pt was seen for bedside swallow evaluation with her husband present physically and her sister present via [facility] video conference system. Pt exhibited difficulty following commands and did not communicate verbally during the evaluation, but her husband stated that she has previously with poor intelligibility. Oral mechanism exam was limited due to pt's difficulty following some commands; however, lingual ROM was reduced. Oral inspection revealed reduced dentition, lingual bleeding, and multiple edematous areas anteriorly and laterally which suggest lingual trauma. Pt presented with symptoms of oropharyngeal  dysphagia characterized by reduced bolus awareness, anterior spillage to the right and no initiation of the pharyngeal swallow. No attempts at bolus manipulation or hyolaryngeal movement were observed despite prompts and cues; boluses were ultimately removed from the oral cavity via oral suction. Pt does not present as a candidate for safe oral intake at this time. It is therefore recommended that the pt's NPO status be maintained with continued use of the Cortrak. SLP will follow to assess improvement in swallow function. SLP Visit Diagnosis: Dysphagia, unspecified (R13.10)    Aspiration Risk  Severe aspiration risk;Risk for inadequate nutrition/hydration    Diet Recommendation NPO;Alternative means - temporary   Medication Administration: Via alternative means    Other  Recommendations Oral Care Recommendations: Oral care QID;Staff/trained caregiver to provide oral care   Follow up Recommendations  (Continued SLP services at level of care recommended by PT/OT)      Frequency and Duration min 2x/week  2 weeks       Prognosis Prognosis for Safe Diet Advancement: Fair Barriers to Reach Goals: Cognitive deficits;Severity of deficits      Swallow Study   General Date of Onset: 08/14/20 HPI: Pt is a 55 y.o. female who underwent planned EGD and colonoscopy for persistent nausea and vomiting and suffered PEA cardiac arrest during procedure. ROSC achieved after 10 minutes of ACLS protocol. Patient was intubated en route to the hospital. ETT 7/19-7/27. Pt on CRRT, EEG x 2 were negative  for electrographic seizures, but consistent with severe encephalopathy, anoxic brain injury suspected. MRI brain negative for acute changes. Cortrak placed 7/27. PMT consulted and family meeting planned for 7/28 at 1400. PMH significant for ESRD on dialysis at home 4 days a week, CAD s/p PCI with DES, HTN, HLD, type 2 diabetes, morbid obesity, and anemia. Type of Study: Bedside Swallow Evaluation Previous Swallow  Assessment: none Diet Prior to this Study: NPO;NG Tube Temperature Spikes Noted: No Respiratory Status: Nasal cannula History of Recent Intubation: Yes Length of Intubations (days): 8 days Date extubated: 08/14/20 Behavior/Cognition: Alert;Cooperative;Doesn't follow directions;Requires cueing Oral Cavity Assessment: Edema Oral Care Completed by SLP: Recent completion by staff Oral Cavity - Dentition: Missing dentition Self-Feeding Abilities: Total assist Patient Positioning: Upright in bed;Postural control adequate for testing Baseline Vocal Quality: Not observed Volitional Cough: Cognitively unable to elicit Volitional Swallow: Unable to elicit    Oral/Motor/Sensory Function Overall Oral Motor/Sensory Function:  (UTA)   Ice Chips Ice chips: Impaired Presentation: Spoon Oral Phase Impairments: Poor awareness of bolus Oral Phase Functional Implications: Right anterior spillage   Thin Liquid Thin Liquid: Not tested    Nectar Thick Nectar Thick Liquid: Not tested   Honey Thick Honey Thick Liquid: Not tested   Puree Puree: Impaired Presentation: Spoon Oral Phase Impairments: Poor awareness of bolus;Reduced lingual movement/coordination Oral Phase Functional Implications: Oral holding   Solid     Solid: Impaired Presentation: Spoon Oral Phase Impairments: Poor awareness of bolus;Reduced lingual movement/coordination     Vinaya Sancho I. Hardin Negus, [place], [place] Office number [phone] Pager [phone]  Horton Marshall September 04, 2020,10:47 AM

## 2020-08-19 NOTE — Progress Notes (Signed)
Patient ID: Brenda Davis, female   DOB: 17-May-1965, 55 y.o.   MRN: [id number]     Progress Note from the Palliative Medicine Team at [facility]   Patient Name: [name]        Date: 10-Sep-2020 DOB: September 06, 1965  Age: 55 y.o. MRN#: [id number] Attending Physician: Juanito Doom, MD Primary Care Physician: [name], MD Admit Date: 07/26/2020   Medical records reviewed, assessed patient, discussed with treatment team.   55 y.o. female  admitted on 07/30/2020 with end-stage renal disease on dialysis, coronary artery disease, hypertension diabetes and hyperlipidemia who 1 underwent planned EGD and colonoscopy for persistent nausea and vomiting, during procedure patient suffered PEA cardiac arrest due to unknown etiology.     ROSC was achieved after 10 minutes of ACLS protocol.    Patient was intubated in route to the hospital.   Today is day 9 of this hospital stay, she was extubated yesterday.  She is lethargic, intermittently follows commands     Currently she is protecting  her airway with intermittent  cough, requiring intermittent suctioning.   Family face treatment option decision, advanced directive decisions and anticipatory care needs.      This NP meet with large family including (husband, two son, mother, sister and in laws)  for continued conversation regarding current medical situation. Dr Lake Bells updated and answered family's questions and concerns..  Created space and opportunity for family to explore their thoughts and feelings regarding current medical situation.  Anthony/husband clearly states the fact that his wife/Kaysea verbalized to him her desire to never be dependent on life prolonging measures.    Both of the patient's sons state that their mother also shared this same sentiment with them.  All understand the seriousness of the current medical situation.  All verbalize hope for improvement and recovery.  They are a prayerful   family, based in faith.  Spiritual care consulted   All family members present today agree with decision for DNR/DNI at this time.   Continue all supportive care measures.  They understand that anything can happen at any time.  The patient is vulnerable and high risk for decompensation.  Discussed with family the importance of continued conversation with each other  and the medical providers regarding overall plan of care and treatment options,  ensuring decisions are within the context of the patients values and GOCs.  Questions and concerns addressed     This nurse practitioner informed  the family and the attending that I will be out of the hospital until Monday morning.  If the patient is still hospitalized I will follow-up at that time.  Call palliative medicine team phone # [phone] with questions or concerns in the interim   Discussed with Dr Lake Bells   Total time spent on the unit was 75 minutes   Greater than 50% of the time was spent in counseling and coordination of care  Wadie Lessen NP  Palliative Medicine Team Team Phone # [phone] Pager [phone]

## 2020-08-19 NOTE — Progress Notes (Signed)
Attending:    Subjective: Felt to be over sedated last night with precedex Concern for ability to tolerate her secretions again this morning Intermittently follows commands Some fluid removed with hemodialysis tomorrow Off vasopressors this morning  Objective: Vitals:   September 12, 2020 0500 09/12/2020 0600 2020/09/12 0700 09/12/2020 0800  BP:      Pulse: (!) 55 (!) 57 (!) 58 62  Resp: 18 18 20  (!) 21  Temp:    (!) 96.9 F (36.1 C)  TempSrc:    Axillary  SpO2: 100% 100% 100% 100%  Weight:      Height:          Intake/Output Summary (Last 24 hours) at 09-12-20 0906 Last data filed at 09/12/2020 0800 Gross per 24 hour  Intake 1395.48 ml  Output 1070 ml  Net 325.48 ml    General:  Resting comfortably in bed HENT: NCAT OP clear PULM: CTA B, normal effort CV: RRR, no mgr GI: BS+, soft, nontender MSK: normal bulk and tone Neuro: awake, not following commands for me, moves all four extremities    CBC    Component Value Date/Time   WBC 8.7 09-12-20 0410   RBC 2.26 (L) 12-Sep-2020 0410   HGB 7.0 (L) 09/12/2020 0410   HGB 8.1 (L) 02/28/2020 1047   HCT 22.1 (L) 09-12-20 0410   HCT 25.0 (L) 02/28/2020 1047   PLT 253 September 12, 2020 0410   PLT 218 02/28/2020 1047   MCV 97.8 09/12/2020 0410   MCV 98 (H) 02/28/2020 1047   MCH 31.0 2020/09/12 0410   MCHC 31.7 12-Sep-2020 0410   RDW 16.1 (H) 09/12/2020 0410   RDW 15.0 02/28/2020 1047   LYMPHSABS 1.4 07/23/2020 1254   LYMPHSABS 1.1 02/28/2020 1047   MONOABS 0.3 07/21/2020 1254   EOSABS 0.1 08/09/2020 1254   EOSABS 0.0 02/28/2020 1047   BASOSABS 0.0 07/31/2020 1254   BASOSABS 0.0 02/28/2020 1047    BMET    Component Value Date/Time   NA 133 (L) 09-12-20 0410   NA 139 02/28/2020 1047   K 5.2 (H) Sep 12, 2020 0410   CL 102 09-12-20 0410   CO2 27 12-Sep-2020 0410   GLUCOSE 184 (H) September 12, 2020 0410   BUN 31 (H) 09-12-20 0410   BUN 29 (H) 02/28/2020 1047   CREATININE 2.20 (H) 2020-09-12 0410   CREATININE 2.28 (H) 03/21/2012  0952   CALCIUM 8.8 (L) September 12, 2020 0410   GFRNONAA 26 (L) 09/12/20 0410   GFRAA 6 (L) 02/28/2020 1047      Impression/Plan: Anoxic brain injury: minimize sedation as much as possible; it's delicate balance between using sedation to keep her from removing tubes/lines and masking any improvement;  minimize sedation/precedex Hypotension, chronic> continue midodrine, monitor off levophed Tongue laceration> improved, monitor for bleeding Hyperglycemia> improved, continue SSI and tube feeding coverage for now; would like to simplify regimen by tomorrow and convert to long acting insulin CAD/NSTEMI/cardiac arrest/new wall motion abnormality and systolic heart failrue> Tele, continue asa/brillinta, holding b-blocker with hypotension  Lengthy conversation with her husband today, he is leaning towards comfort measures but wants to discuss with her entire family  Plan family conversation today at 1400  My cc time 32 minutes  Roselie Awkward, MD [facility] Pager: [phone] Cell: [phone] After 7pm: [phone]

## 2020-08-19 NOTE — Progress Notes (Signed)
Patient ID: Brenda Davis, female   DOB: 1965-02-15, 55 y.o.   MRN: [id number] S: off of pressors as of yesterday.   O:BP (!) 109/50   Pulse 65   Temp (!) 96.9 F (36.1 C) (Axillary) Comment: warm blankets applied  Resp 17   Ht 5' 5"  (1.651 m)   Wt 122.8 kg   SpO2 97%   BMI 45.05 kg/m   Intake/Output Summary (Last 24 hours) at Aug 17, 2020 1123 Last data filed at 08-17-20 1100 Gross per 24 hour  Intake 1331.74 ml  Output 913 ml  Net 418.74 ml    Intake/Output: I/O last 3 completed shifts: In: 2970.8 [I.V.:1345.8; NG/GT:1625] Out: 2619 [Other:2619]  Intake/Output this shift:  Total I/O In: 290.1 [I.V.:90.1; NG/GT:200] Out: 191 [Other:191] Weight change: -1.4 kg  Exam:  alert, nad   no jvd  Chest cta bilat  Cor reg no RG  Abd soft ntnd no ascites   Ext no LE edema   Alert, lethargic, follows simple commands   L AVF+bruit      Iron Studies:  Recent Labs    08/13/20 0329  IRON 25*  TIBC 196*    Studies/Results: DG Abd Portable 1V  Result Date: 08/14/2020 CLINICAL DATA:  Cortrak feeding tube placement EXAM: PORTABLE ABDOMEN - 1 VIEW COMPARISON:  None. FINDINGS: Feeding tube tip is in the mid to distal stomach. Nonobstructive bowel gas pattern. IMPRESSION: Feeding tube tip in the mid to distal stomach. Electronically Signed   By: Rolm Baptise M.D.   On: 08/14/2020 16:23    aspirin  81 mg Per Tube Daily   chlorhexidine  15 mL Mouth Rinse BID   Chlorhexidine Gluconate Cloth  6 each Topical Daily   darbepoetin (ARANESP) injection - DIALYSIS  60 mcg Intravenous Q Mon-HD   famotidine  10 mg Per Tube Daily   feeding supplement (PROSource TF)  45 mL Per Tube 5 X Daily   gabapentin  100 mg Per Tube Q12H   heparin injection (subcutaneous)  5,000 Units Subcutaneous Q8H   insulin aspart  0-15 Units Subcutaneous Q4H   insulin aspart  5 Units Subcutaneous Q4H   mouth rinse  15 mL Mouth Rinse q12n4p   midodrine  10 mg Per Tube Q8H   multivitamin  1 tablet  Per Tube Daily   polyethylene glycol  17 g Per Tube BID   rosuvastatin  20 mg Per Tube Daily   sennosides  5 mL Per Tube BID   sodium chloride flush  10-40 mL Intracatheter Q12H   ticagrelor  90 mg Per Tube BID    Dialysis Orders: Center: [facility]  on 4 treatments/week   124kg  2/2.5 bath  3.5h   Hep 5000   LUE AVF  400/600     Assessment/Plan:  PEA arrest - s/p ACLS with ROSC in 10 minutes. ECHO with EF of 45-50% and Grade I DD. Per CCM.  Acute hypoxic respiratory failure - s/p PEA arrest w/ intubation in the field - possible aspiration.  Per CCM.   ESRD - on HD 4x per wk at home. Plan MWF here. Is now on CRRT started 7/25 due to shock. Will dc CRRT today. Plan next HD on Sat.   HypOtension - of pressors, on midodrine 65m tid per NG.  Vol - 1.5kg under dry wt, on TF's. No vol excess on exam  Anemia cdk - Hb 11 >> 6-7 in hospital.  SPprbcs. Started ESA w/ darbe 60ug weekly on monday.  Metabolic bone disease -  hold til taking po  Nutrition - Getting tube feeds H/o CAD s/p NSTEMI PCI and DES DM type II - per primary. Acute metabolic encephalopathy - EEG w/o seizures. MRI without evidence of anoxic brain injury. More responsive    Kelly Splinter, MD 2020/09/09, 11:23 AM

## 2020-08-19 NOTE — Evaluation (Signed)
Physical Therapy Evaluation Patient Details Name: Brenda Davis MRN: [id number] DOB: 07/13/1965 Today's Date: 2020-09-07   History of Present Illness  55 y.o. female  admitted on 08/02/2020 with end-stage renal disease on dialysis, coronary artery disease, hypertension diabetes and hyperlipidemia who underwent planned EGD and colonoscopy for persistent nausea and vomiting;  during procedure patient suffered PEA cardiac arrest. Intubated 7/19-7/27; EEG encephalopathy; MRI normal. Assumed hypoxic brain injury. CRRT began 7/25 via RIJ catheter.  Clinical Impression   Pt admitted secondary to problem above with deficits below. PTA patient was independent, living with spouse and doing home HD (per RN).  Pt currently requires +2 total assist for all mobility, however she is awake, following simple commands with a delay of ~5 seconds. Pt nodding head yes and no during session. Attends to her left side by turning her head and eyes. Anticipate patient will benefit from PT to address problems listed below.Will continue to follow acutely to maximize functional mobility independence and safety.       Follow Up Recommendations CIR    Equipment Recommendations  Wheelchair (measurements PT);Wheelchair cushion (measurements PT);Hospital bed    Recommendations for Other Services Rehab consult     Precautions / Restrictions Precautions Precautions: Fall Precaution Comments: CRRT lines; ICU monitors      Mobility  Bed Mobility Overal bed mobility: Needs Assistance Bed Mobility: Rolling Rolling: Total assist;+2 for physical assistance         General bed mobility comments: rolling rt and lt to change bed linens due to soiled    Transfers                 General transfer comment: pt fatigued after bed mobiltiy and cleaning with incr WOB and EOB deferred  Ambulation/Gait                Stairs            Wheelchair Mobility    Modified Rankin (Stroke Patients  Only)       Balance                                             Pertinent Vitals/Pain Pain Assessment: Faces Faces Pain Scale: Hurts even more Pain Location: perineum with pericare Pain Descriptors / Indicators: Grimacing Pain Intervention(s): Limited activity within patient's tolerance    Home Living Family/patient expects to be discharged to:: Private residence Living Arrangements: Spouse/significant other Available Help at Discharge: Family Type of Home: House Home Access: Stairs to enter Entrance Stairs-Rails: None Entrance Stairs-Number of Steps: 2 Home Layout: One level Home Equipment: Environmental consultant - 2 wheels;Bedside commode;Cane - single point;Shower seat Additional Comments: per previous record    Prior Function Level of Independence: Independent         Comments: per previous records     Hand Dominance        Extremity/Trunk Assessment   Upper Extremity Assessment Upper Extremity Assessment: Defer to OT evaluation    Lower Extremity Assessment Lower Extremity Assessment: RLE deficits/detail;LLE deficits/detail RLE Deficits / Details: less sponstaneous movement than LLE; +ankle dorsiflexion when asked to move her leg; no active flexion on command; +active hip/knee extension on command; overall grossly 3+/5 LLE Deficits / Details: persistently moving into flexion and over side of bed; would push to extend on command when LE held in hip/knee flexion; grossly 4/5    Cervical / Trunk  Assessment Cervical / Trunk Assessment: Other exceptions Cervical / Trunk Exceptions: obese  Communication   Communication: Receptive difficulties;Expressive difficulties  Cognition Arousal/Alertness: Awake/alert Behavior During Therapy: Restless (spontaneously moving 4 extremities and lifting head) Overall Cognitive Status: Difficult to assess Area of Impairment: Attention;Following commands;Safety/judgement                   Current Attention Level:  Sustained   Following Commands: Follows one step commands inconsistently;Follows one step commands with increased time Safety/Judgement: Decreased awareness of safety (moving legs off EOB; moving hands to face near OX, cortrak, nasal trumpet)     General Comments: pt did squeeze and release with each hand (delayed response); after each LE bent,she would push to straighten (with delay); mouthed "ok" x 1      General Comments General comments (skin integrity, edema, etc.): RN in to assist with large, liquid stool and cleaning pt; no family present to confirm home set-up. Family to be involved in conference with MDs within minutes and did not try to contact them for information    Exercises Other Exercises Other Exercises: PROM hip/knee/ankle flexion x 5 reps each; pt abducting AROM, adducting back onto bed with PROM   Assessment/Plan    PT Assessment Patient needs continued PT services  PT Problem List Decreased strength;Decreased activity tolerance;Decreased balance;Decreased mobility;Decreased cognition;Decreased knowledge of use of DME;Decreased safety awareness;Decreased knowledge of precautions;Cardiopulmonary status limiting activity;Obesity       PT Treatment Interventions DME instruction;Gait training;Functional mobility training;Therapeutic activities;Therapeutic exercise;Balance training;Neuromuscular re-education;Cognitive remediation;Patient/family education    PT Goals (Current goals can be found in the Care Plan section)  Acute Rehab PT Goals Patient Stated Goal: pt unable PT Goal Formulation: Patient unable to participate in goal setting Time For Goal Achievement: 08/29/20 Potential to Achieve Goals: Good    Frequency Min 3X/week   Barriers to discharge        Co-evaluation PT/OT/SLP Co-Evaluation/Treatment: Yes Reason for Co-Treatment: Complexity of the patient's impairments (multi-system involvement);Other (comment) (Patient size) PT goals addressed during  session: Other (comment);Mobility/safety with mobility (following commands)         AM-PAC PT "6 Clicks" Mobility  Outcome Measure Help needed turning from your back to your side while in a flat bed without using bedrails?: Total Help needed moving from lying on your back to sitting on the side of a flat bed without using bedrails?: Total Help needed moving to and from a bed to a chair (including a wheelchair)?: Total Help needed standing up from a chair using your arms (e.g., wheelchair or bedside chair)?: Total Help needed to walk in hospital room?: Total Help needed climbing 3-5 steps with a railing? : Total 6 Click Score: 6    End of Session Equipment Utilized During Treatment: Oxygen Activity Tolerance: Patient limited by fatigue Patient left: in chair;with call bell/phone within reach;with nursing/sitter in room;with restraints reapplied (bil mitts) Nurse Communication: Other (comment) (following some commands) PT Visit Diagnosis: Other symptoms and signs involving the nervous system (R29.898)    Time: 1310-1343 PT Time Calculation (min) (ACUTE ONLY): 33 min   Charges:   PT Evaluation $PT Eval Moderate Complexity: 1 Mod           Arby Barrette, PT Pager [phone]   Rexanne Mano 09/05/20, 2:06 PM

## 2020-08-19 NOTE — Death Summary Note (Signed)
DEATH SUMMARY   Patient Details  Name: Brenda Davis MRN: [id number] DOB: August 20, 1965  Admission/Discharge Information   Admit Date:  08/26/2020  Date of Death: Date of Death: Sep 04, 2020  Time of Death: Time of Death: Apr 04, 1908  Length of Stay: 03/19/2022  Referring Physician: [name], MD   Reason(s) for Hospitalization  Cardiac arrest  Diagnoses  Preliminary cause of death:  Anoxic brain injury Secondary Diagnoses (including complications and co-morbidities):  Active Problems:   Encounter for central line placement   Cardiac arrest ([place]) ESRD on hemodialysis Anemia CAD s/p PCI Gout Chronic hypotension> required midodrine for hemodialysis Hyperlidemia Morbid obesity DM2   Brief Hospital Course (including significant findings, care, treatment, and services provided and events leading to death)  Brenda Davis is a 55 y.o. year old female who had a cardiac arrest during an elective endoscopy in [place].  She was brought to the [facility] Emergency department after receiving 10 minutes of CPR.  She was intubated in the ER and admitted to the ICU.  In the ICU she received supportive care.  EEG and MRI were performed which showed no epileptiform activity or significant radiographic acute abnormality.  HOwever she was slow to wake up despite sedation being held for several days.  Neurology was consulted and noted that she had some degree of anoxic brain injury.  On 7/26 her neurologic exam improved and she began to follow commands.  She was extubated on 09-05-22.  Her neurologic exam would wax and wane: there would be times when she was able to smile and follow commands but other times she remained delirious.  She was treated with CRRT due to her hypotension.  We had lengthy family conversations and they explained that she had made it clear that she did not want to be on machines (ie life support) so they made her code status DNR but we elected to continue supportive  medical therapy with a hopes that she would improved.  However on 2022-09-05 had the sudden onset of bradycardia without a precipitating change in her status otherwise.  This lead to an abrupt cardiac arrest and she passed.      Pertinent Labs and Studies  Significant Diagnostic Studies CT Head Wo Contrast  Result Date: August 26, 2020 CLINICAL DATA:  Mental status change. EXAM: CT HEAD WITHOUT CONTRAST TECHNIQUE: Contiguous axial images were obtained from the base of the skull through the vertex without intravenous contrast. COMPARISON:  MRI 04/21/2018 FINDINGS: Exam limited by patient motion. Brain: No evidence of acute infarction, hemorrhage, hydrocephalus, extra-axial collection or mass lesion/mass effect. Vascular: Scattered vascular calcifications.  No hyperdense vessels. Skull: No skull fracture or bone lesion. Sinuses/Orbits: The paranasal sinuses and mastoid air cells are clear. The globes are intact. Other: No scalp hematoma or lesion. IMPRESSION: No acute intracranial findings or mass lesions. Electronically Signed   By: Marijo Sanes M.D.   On: Aug 26, 2020 14:38   MR BRAIN W WO CONTRAST  Result Date: 08/07/2020 CLINICAL DATA:  Mental status change. Concern for anoxic brain injury. EXAM: MRI HEAD WITHOUT AND WITH CONTRAST TECHNIQUE: Multiplanar, multiecho pulse sequences of the brain and surrounding structures were obtained without and with intravenous contrast. CONTRAST:  39m GADAVIST GADOBUTROL 1 MMOL/ML IV SOLN COMPARISON:  CT head J08-08-2020  MRI April 21, 2018. FINDINGS: Brain: No acute infarction, hemorrhage, hydrocephalus, extra-axial collection or mass lesion. Mild scattered small T2/FLAIR hyperintense lesions within the white matter, nonspecific but most likely related to mild chronic microvascular ischemic disease. No  abnormal enhancement. Vascular: Major arterial flow voids are maintained skull base. Skull and upper cervical spine: Diffuse T1 hypointensity of the marrow without discrete  marrow replacing lesion. Sinuses/Orbits: Mild sinus mucosal thickening.  Unremarkable orbits. Other: Small right greater than left mastoid effusions. IMPRESSION: 1. No evidence of acute intracranial abnormality. 2. Mild chronic microvascular ischemic disease. 3. Diffuse T1 hypointensity of the marrow, which is nonspecific but can be seen with chronic anemia, chronic hypoxia (such as in smokers), and lymphoproliferative disorders. Electronically Signed   By: Margaretha Sheffield MD   On: 08/07/2020 15:49   DG Chest Port 1 View  Result Date: 08/13/2020 CLINICAL DATA:  Acute respiratory failure. EXAM: PORTABLE CHEST 1 VIEW COMPARISON:  08/12/2020. FINDINGS: Endotracheal tube, NG tube, right IJ line in stable position. Cardiomegaly again noted. Low lung volumes again noted. Bilateral pulmonary infiltrates/edema with slight interim progression. Small left pleural effusion. No pneumothorax. IMPRESSION: 1.  Lines and tubes in stable position. 2.  Cardiomegaly again noted. 3. Low lung volumes. Bilateral pulmonary infiltrates/edema noted with slight interim progression from prior exam. Small left pleural effusion. Electronically Signed   By: Marcello Moores  Register   On: 08/13/2020 07:20   DG CHEST PORT 1 VIEW  Result Date: 08/12/2020 CLINICAL DATA:  Central line placement EXAM: PORTABLE CHEST 1 VIEW COMPARISON:  Radiograph 08/11/2020, CT 03/12/2019 FINDINGS: Endotracheal tube overlies the upper thoracic trachea. There is a nasogastric tube which passes below the diaphragm, tip excluded by collimation. There is a new right hip roach central venous catheter with tip overlying the distal superior vena cava. Unchanged, enlarged cardiomediastinal silhouette. Unchanged diffuse interstitial and basilar predominant airspace opacities. No visible pneumothorax. No large pleural effusion. No acute osseous abnormality. IMPRESSION: Right neck central venous catheter tip overlies the distal superior vena cava. Unchanged cardiomegaly and  mild edema. Electronically Signed   By: Maurine Simmering   On: 08/12/2020 17:31   DG Chest Port 1 View  Result Date: 08/11/2020 CLINICAL DATA:  Acute respiratory failure, post CPR EXAM: PORTABLE CHEST 1 VIEW COMPARISON:  08/09/2020 chest radiograph. FINDINGS: Enteric tube enters stomach with the tip not seen on this image. Endotracheal tube tip is 4.8 cm above the carina. Stable cardiomediastinal silhouette with mild cardiomegaly. No pneumothorax. No pleural effusion. Patchy hazy and streaky opacities throughout both lungs, improved particularly in the right lung. IMPRESSION: 1. Well-positioned support structures. 2. Mild cardiomegaly. Patchy hazy and streaky opacities throughout both lungs, improved particularly in the right lung, favor improving pulmonary edema. Electronically Signed   By: Ilona Sorrel M.D.   On: 08/11/2020 08:00   DG Chest Port 1 View  Result Date: 08/07/2020 CLINICAL DATA:  Patient status post CPR today. EXAM: PORTABLE CHEST 1 VIEW COMPARISON:  PA and lateral chest 04/04/2019. FINDINGS: Endotracheal tube is in place with the tip in good position at the level of the clavicular heads. Extensive airspace disease is present throughout the right chest. There is more patchy airspace disease in the left chest, most notable in the left base. Defibrillator pad is in place. No pneumothorax or pleural effusion is identified. Heart size is normal. No bony abnormality. IMPRESSION: ETT in good position. Right much worse than left airspace disease could be due to pneumonia or aspiration. Electronically Signed   By: Inge Rise M.D.   On: 07/19/2020 13:46   DG Abd Portable 1V  Result Date: 08/14/2020 CLINICAL DATA:  Cortrak feeding tube placement EXAM: PORTABLE ABDOMEN - 1 VIEW COMPARISON:  None. FINDINGS: Feeding tube tip is in the  mid to distal stomach. Nonobstructive bowel gas pattern. IMPRESSION: Feeding tube tip in the mid to distal stomach. Electronically Signed   By: Rolm Baptise M.D.   On:  08/14/2020 16:23   EEG adult  Result Date: 08/07/2020 Lora Havens, MD     08/07/2020  2:34 PM Patient Name: ELENORA HAWBAKER MRN: [id number] Epilepsy Attending: Lora Havens Referring Physician/Provider: Gerald Leitz, NP Date: 08/07/2020 Duration: 23.46 mins Patient history: 55yo F s/p cardiac arrest. EEG to evaluate for seizure.  Level of alertness:  comatose  AEDs during EEG study: GBP  Technical aspects: This EEG study was done with scalp electrodes positioned according to the 10-20 International system of electrode placement. Electrical activity was acquired at a sampling rate of 500Hz  and reviewed with a high frequency filter of 70Hz  and a low frequency filter of 1Hz . EEG data were recorded continuously and digitally stored.  Description: EEG showed continuous generalized amplitude sharply contoured 3 to 5 Hz theta-delta slowing, at times with triphasic morphology.  Hyperventilation and photic stimulation were not performed.     ABNORMALITY -Continuous slow, generalized  IMPRESSION: This study is suggestive of severe diffuse encephalopathy, nonspecific etiology. No seizures or epileptiform discharges were seen throughout the recording. EEG appears to be improving compared to previous day.   Lora Havens   EEG adult  Result Date: 08/17/2020 Lora Havens, MD     07/25/2020  5:48 PM Patient Name: ARABELL NERIA MRN: [id number] Epilepsy Attending: Lora Havens Referring Physician/Provider: Gerald Leitz, NP Date: 07/29/2020 Duration: 24.42 mins Patient history: 55yo F s/p cardiac arrest. EEG to evaluate for seizure. Level of alertness:  comatose AEDs during EEG study: GBP Technical aspects: This EEG study was done with scalp electrodes positioned according to the 10-20 International system of electrode placement. Electrical activity was acquired at a sampling rate of 500Hz  and reviewed with a high frequency filter of 70Hz  and a low frequency filter of 1Hz . EEG data  were recorded continuously and digitally stored. Description: EEG showed continuous generalized background attenuation.  Hyperventilation and photic stimulation were not performed.   Of note, EEG was technically difficult due to significant myogenic artifact. ABNORMALITY - Background attenuation, generalized IMPRESSION: This technically difficult study is suggestive of profound diffuse encephalopathy, nonspecific etiology. No seizures or epileptiform discharges were seen throughout the recording. If suspicion for  ictal-interictal activity remains a concern, a repeat study with neuromuscular blockade can be considered. Lora Havens   ECHOCARDIOGRAM COMPLETE  Result Date: 08/07/2020    ECHOCARDIOGRAM REPORT   Patient Name:   JUSTIN BUECHNER Date of Exam: 08/07/2020 Medical Rec #:  [id number]                 Height:       65.0 in Accession #:    [id number]                Weight:       272.0 lb Date of Birth:  1965-12-26                BSA:          2.254 m Patient Age:    65 years                  BP:           158/94 mmHg Patient Gender: F  HR:           86 bpm. Exam Location:  Inpatient Procedure: 2D Echo, Cardiac Doppler, Color Doppler and Intracardiac            Opacification Agent Indications:    Cardiac arrest  History:        Patient has prior history of Echocardiogram examinations, most                 recent 01/16/2019. Previous Myocardial Infarction and CAD,                 Arrythmias:Cardiac Arrest, Signs/Symptoms:ESRD; Risk                 Factors:Hypertension, Diabetes, Dyslipidemia and Morbid obesity.  Sonographer:    Dustin Flock Referring Phys: [phone] WHITNEY D HARRIS  Sonographer Comments: Technically difficult study due to poor echo windows and patient is morbidly obese. IMPRESSIONS  1. Left ventricular ejection fraction, by estimation, is 45 to 50%. The left ventricle has mildly decreased function. The left ventricle demonstrates regional wall motion  abnormalities with apical hypokinesis. There is mild left ventricular hypertrophy.  Left ventricular diastolic parameters are consistent with Grade I diastolic dysfunction (impaired relaxation).  2. Right ventricular systolic function is mildly reduced. The right ventricular size is mildly enlarged. There is mildly elevated pulmonary artery systolic pressure. The estimated right ventricular systolic pressure is 40.1 mmHg.  3. The mitral valve is normal in structure. Trivial mitral valve regurgitation. No evidence of mitral stenosis.  4. The aortic valve is tricuspid. Aortic valve regurgitation is not visualized. Mild aortic valve sclerosis is present, with no evidence of aortic valve stenosis.  5. The inferior vena cava is dilated in size with >50% respiratory variability, suggesting right atrial pressure of 8 mmHg. FINDINGS  Left Ventricle: Left ventricular ejection fraction, by estimation, is 45 to 50%. The left ventricle has mildly decreased function. The left ventricle demonstrates regional wall motion abnormalities. Definity contrast agent was given IV to delineate the left ventricular endocardial borders. The left ventricular internal cavity size was normal in size. There is mild left ventricular hypertrophy. Left ventricular diastolic parameters are consistent with Grade I diastolic dysfunction (impaired relaxation). Right Ventricle: The right ventricular size is mildly enlarged. No increase in right ventricular wall thickness. Right ventricular systolic function is mildly reduced. There is mildly elevated pulmonary artery systolic pressure. The tricuspid regurgitant  velocity is 3.23 m/s, and with an assumed right atrial pressure of 3 mmHg, the estimated right ventricular systolic pressure is 02.7 mmHg. Left Atrium: Left atrial size was normal in size. Right Atrium: Right atrial size was normal in size. Pericardium: Trivial pericardial effusion is present. Mitral Valve: The mitral valve is normal in  structure. Mild mitral annular calcification. Trivial mitral valve regurgitation. No evidence of mitral valve stenosis. Tricuspid Valve: The tricuspid valve is normal in structure. Tricuspid valve regurgitation is trivial. Aortic Valve: The aortic valve is tricuspid. Aortic valve regurgitation is not visualized. Mild aortic valve sclerosis is present, with no evidence of aortic valve stenosis. Pulmonic Valve: The pulmonic valve was normal in structure. Pulmonic valve regurgitation is trivial. Aorta: The aortic root is normal in size and structure. Venous: The inferior vena cava is dilated in size with greater than 50% respiratory variability, suggesting right atrial pressure of 8 mmHg. IAS/Shunts: No atrial level shunt detected by color flow Doppler.  LEFT VENTRICLE PLAX 2D LVIDd:         4.80 cm  Diastology LVIDs:  3.10 cm  LV e' medial:    0.04 cm/s LV PW:         1.20 cm  LV E/e' medial:  28.4 LV IVS:        1.30 cm  LV e' lateral:   0.05 cm/s LVOT diam:     2.10 cm  LV E/e' lateral: 19.3 LV SV:         49 LV SV Index:   22 LVOT Area:     3.46 cm  RIGHT VENTRICLE RV Basal diam:  3.70 cm RV S prime:     7.83 cm/s TAPSE (M-mode): 1.2 cm LEFT ATRIUM             Index       RIGHT ATRIUM           Index LA diam:        3.20 cm 1.42 cm/m  RA Area:     13.70 cm LA Vol (A2C):   36.7 ml 16.28 ml/m RA Volume:   34.00 ml  15.08 ml/m LA Vol (A4C):   64.3 ml 28.52 ml/m LA Biplane Vol: 54.1 ml 24.00 ml/m  AORTIC VALVE LVOT Vmax:   79.20 cm/s LVOT Vmean:  52.500 cm/s LVOT VTI:    0.142 m  AORTA Ao Root diam: 2.70 cm MITRAL VALVE                TRICUSPID VALVE MV Area (PHT): 7.44 cm     TR Peak grad:   41.7 mmHg MV Decel Time: 102 msec     TR Vmax:        323.00 cm/s MV E velocity: 1.05 cm/s MV A velocity: 112.00 cm/s  SHUNTS MV E/A ratio:  0.01         Systemic VTI:  0.14 m                             Systemic Diam: 2.10 cm Loralie Champagne MD Electronically signed by Loralie Champagne MD Signature Date/Time:  08/07/2020/2:59:45 PM    Final     Microbiology No results found for this or any previous visit (from the past 240 hour(s)).  Lab Basic Metabolic Panel: Recent Labs  Lab 08/13/20 0313 08/13/20 0329 08/13/20 1504 08/14/20 0307 08/14/20 1612 08/14/20 1734 09-Sep-2020 0410  NA 133*  --  134* 135 137 134* 133*  K 4.8  --  4.9 4.9 4.9 5.0 5.2*  CL 97*  --  99 99  --  102 102  CO2 23  --  26 27  --  26 27  GLUCOSE 256*  --  171* 189*  --  120* 184*  BUN 86*  --  62* 46*  --  37* 31*  CREATININE 6.77*  --  4.87* 3.38*  --  2.92* 2.20*  CALCIUM 8.4*  --  8.6* 8.6*  --  8.5* 8.8*  MG  --  2.6*  --  2.7*  --   --  2.6*  PHOS 5.5*  --  4.6 3.4  --  3.3 3.2   Liver Function Tests: Recent Labs  Lab 08/13/20 0313 08/13/20 1504 08/14/20 0307 08/14/20 1734 09-09-20 0410  ALBUMIN 2.1* 2.2* 2.1* 2.0* 2.1*   No results for input(s): LIPASE, AMYLASE in the last 168 hours. Recent Labs  Lab 08/13/20 0943  AMMONIA 38*   CBC: Recent Labs  Lab 08/12/20 0157 08/12/20 1728 08/13/20 0329 08/14/20 9417 08/14/20 1612 09-Sep-2020 0410  WBC 10.1 13.7* 12.4* 10.6*  --  8.7  HGB 6.6* 7.6* 7.8* 7.2* 7.8* 7.0*  HCT 20.3* 22.8* 24.2* 22.3* 23.0* 22.1*  MCV 95.3 93.1 93.8 96.1  --  97.8  PLT 223 233 225 250  --  253   Cardiac Enzymes: No results for input(s): CKTOTAL, CKMB, CKMBINDEX, TROPONINI in the last 168 hours. Sepsis Labs: Recent Labs  Lab 08/12/20 1728 08/12/20 2309 08/13/20 0329 08/14/20 0307 August 31, 2020 0410  WBC 13.7*  --  12.4* 10.6* 8.7  LATICACIDVEN 0.7 0.7  --   --   --     Procedures/Operations  Mechanical ventilation Endotracheal intubation hemodialysis   Roselie Awkward 08/16/2020, 6:26 PM

## 2020-08-19 NOTE — Progress Notes (Signed)
NAME:  Brenda Davis, MRN:  [id number], DOB:  1965-05-05, LOS: 9 ADMISSION DATE:  07/27/2020, CONSULTATION DATE:  07/29/2020 REFERRING MD:  Johnney Killian, CHIEF COMPLAINT:  Cardiac Arrest   History of Present Illness:  Brenda Davis is a 56 y.o. female with a PMH significant for ESRD on dialysis at home 4 days a week, CAD s/p PCI with DES, HTN, HLD, type 2 diabetes, morbid obesity, and anemia who was at an outpatient endoscopy procedure where she suffered a cardiac arrest. Per family she was undergoing a planned colonoscopy and endoscopy for a history of persistent nausea and vomiting. Per report patient had tolerated procedure well until she suddenly suffered a PEA cardiac arrest. Arrest time was estimated at 10 mins, she received 2 round of epi and compressions. Patient was intubated during arrest. Spouse denies any other acute complaints prior to admission.  Pertinent  Medical History  ESRD on HD 4 days/week, CAD s/p PCI with DES, HTN, HLD, T2DM, morbid obesity, anemia  Significant Hospital Events: Including procedures, antibiotic start and stop dates in addition to other pertinent events   7/19: admitted after 10 min PEA arrest during outpatient endoscopy/colonoscopy 7/20: some involuntary movements noted, EEG and MRI obtained 7/21: cardiology consulted, started IV Heparin 7/22: Heparin held due to profuse bleeding from the mouth, ?tongue bite , HD terminated 1.5 hours due to clotting and agitation, RN reported wiggling toes to command 7/23: Precedex started for agitation 7/25: Neuro consulted, central venous catheter placed and CRRT started 7/26: More alert, following commands 7/27: extubated, off levophed  Antibiotics Unasyn 7/19-7/25  Interim History / Subjective:  Patient extubated yesterday. Cortrak placed yesterday afternoon. Nursing concerned about inability to tolerate oral secretions overnight. Appeared oversedated on MD video eval overnight so sedation  protocol was adjusted accordingly. Daytime nurse also concerned about difficulty tolerating secretions. Vitals and respiratory status stable.   Objective   Blood pressure (!) 109/50, pulse (!) 57, temperature (!) 97.5 F (36.4 C), temperature source Axillary, resp. rate 18, height 5' 5"  (1.651 m), weight 122.8 kg, SpO2 100 %.     Intake/Output Summary (Last 24 hours) at 2020-09-01 0734 Last data filed at 09/01/20 0700 Gross per 24 hour  Intake 1544.34 ml  Output 1138 ml  Net 406.34 ml   Filed Weights   08/13/20 0500 08/14/20 0500 09-01-2020 0424  Weight: 124 kg 124.2 kg 122.8 kg    Examination: General: alert, obese, NAD HENT: NG in place in R naris Lungs: prolonged expirations (?pursed-lip breathing), no tachypnea or accessory muscle use, lungs CTAB Cardiovascular: RRR, normal S1/S2 without m/r/g Abdomen: +BS, soft, nontender Extremities: warm and dry Neuro: Awake, alert, intermittently follows simple commands  Resolved Hospital Problem list   Lactic acidosis Elevated Troponin Aspiration PNA Hypothermia  Assessment & Plan:  Anoxic Brain Injury s/p cardiac arrest Patient awake, intermittently follows commands. Was able to be extubated yesterday, but no significant improvement in mental status today. Currently on low dose Precedex (0.12mg) for agitation P: -Avoid sedation as much as possible -Restart home Gabapentin to help with possible pain -Monitor mental status -Family discussion at 2pm today to discuss long term goals of care  PEA arrest H/o CAD s/p PCI and DES WMA on echo P: -Continue Brilinta, ASA, statin -Per cards, repeat echo vs cath pending neuro recovery  Hypotension Resolved. Maintaining pressures off levophed P: -Continue Midodrine 135mTID   Anemia Hgb stable at 7.0 this morning.  P: -Monitor CBC -Monitor for bleeding   ESRD P: -Continue CRRT -  Nephro following, appreciate recs -?transition back to HD   T2DM CBGs 77-184 over past 24h.  Received 30u Aspart yesterday. P: -SSI -q4h tube feed coverage -CBG monitoring  Best Practice (right click and "Reselect all SmartList Selections" daily)   Diet/type: tubefeeds DVT prophylaxis: prophylactic heparin  GI prophylaxis: PPI Lines: Dialysis Catheter and Arterial Line Foley:  N/A Code Status:  full code Last date of multidisciplinary goals of care discussion [7/28]  Labs   CBC: Recent Labs  Lab 08/12/20 0157 08/12/20 1728 08/13/20 0329 08/14/20 0307 08/14/20 1612 September 09, 2020 0410  WBC 10.1 13.7* 12.4* 10.6*  --  8.7  HGB 6.6* 7.6* 7.8* 7.2* 7.8* 7.0*  HCT 20.3* 22.8* 24.2* 22.3* 23.0* 22.1*  MCV 95.3 93.1 93.8 96.1  --  97.8  PLT 223 233 225 250  --  465    Basic Metabolic Panel: Recent Labs  Lab 08/13/20 0313 08/13/20 0329 08/13/20 1504 08/14/20 0307 08/14/20 1612 08/14/20 1734 09/09/20 0410  NA 133*  --  134* 135 137 134* 133*  K 4.8  --  4.9 4.9 4.9 5.0 5.2*  CL 97*  --  99 99  --  102 102  CO2 23  --  26 27  --  26 27  GLUCOSE 256*  --  171* 189*  --  120* 184*  BUN 86*  --  62* 46*  --  37* 31*  CREATININE 6.77*  --  4.87* 3.38*  --  2.92* 2.20*  CALCIUM 8.4*  --  8.6* 8.6*  --  8.5* 8.8*  MG  --  2.6*  --  2.7*  --   --  2.6*  PHOS 5.5*  --  4.6 3.4  --  3.3 3.2   GFR: Estimated Creatinine Clearance: 38 mL/min (A) (by C-G formula based on SCr of 2.2 mg/dL (H)). Recent Labs  Lab 08/12/20 1728 08/12/20 2309 08/13/20 0329 08/14/20 0307 2020/09/09 0410  WBC 13.7*  --  12.4* 10.6* 8.7  LATICACIDVEN 0.7 0.7  --   --   --     Liver Function Tests: Recent Labs  Lab 08/13/20 0313 08/13/20 1504 08/14/20 0307 08/14/20 1734 Sep 09, 2020 0410  ALBUMIN 2.1* 2.2* 2.1* 2.0* 2.1*   No results for input(s): LIPASE, AMYLASE in the last 168 hours. Recent Labs  Lab 08/13/20 0943  AMMONIA 38*    ABG    Component Value Date/Time   PHART 7.457 (H) 08/14/2020 1612   PCO2ART 39.9 08/14/2020 1612   PO2ART 100 08/14/2020 1612   HCO3 28.3 (H)  08/14/2020 1612   TCO2 30 08/14/2020 1612   ACIDBASEDEF 1.0 01/18/2019 2358   O2SAT 98.0 08/14/2020 1612     Coagulation Profile: No results for input(s): INR, PROTIME in the last 168 hours.   Cardiac Enzymes: No results for input(s): CKTOTAL, CKMB, CKMBINDEX, TROPONINI in the last 168 hours.  HbA1C: Hemoglobin A1C  Date/Time Value Ref Range Status  07/27/2017 12:00 AM 6.8  Final  05/24/2017 12:00 AM 5.8  Final   Hgb A1c MFr Bld  Date/Time Value Ref Range Status  07/28/2020 12:51 PM 5.5 4.8 - 5.6 % Final    Comment:    (NOTE) Pre diabetes:          5.7%-6.4%  Diabetes:              >6.4%  Glycemic control for   <7.0% adults with diabetes   02/23/2020 09:31 AM 6.3 4.6 - 6.5 % Final    Comment:    Glycemic  Control Guidelines for People with Diabetes:Non Diabetic:  <6%Goal of Therapy: <7%Additional Action Suggested:  >8%     CBG: Recent Labs  Lab 08/14/20 0722 08/14/20 1519 08/14/20 1937 08/27/2020 0011 Aug 27, 2020 0419  GLUCAP 181* 77 146* 197* 163*    Past Medical History:  She,  has a past medical history of Anemia, CAD (coronary artery disease), ESRD on dialysis ([place]), Gout, HCAP (healthcare-associated pneumonia) (05/06/2016), History of blood transfusion, Hyperlipidemia, Hypertension, Morbid obesity ([place]), Pain, Palpitations (09/24/2016), Peritonitis, dialysis-associated ([place]), SVT (supraventricular tachycardia) ([place]) (02/28/2020), and Type II diabetes mellitus ([place]).   Surgical History:   Past Surgical History:  Procedure Laterality Date   AV FISTULA PLACEMENT Left 01/03/2015   Procedure: BRACHIAL CEPHALIC ARTERIOVENOUS  FISTULA CREATION LEFT ARM;  Surgeon: Angelia Mould, MD;  Location: [place];  Service: Vascular;  Laterality: Left;   CARPAL TUNNEL RELEASE Right    [place]  09/14/2017   CORONARY STENT INTERVENTION N/A 01/17/2019   Procedure: CORONARY STENT INTERVENTION;  Surgeon: Jettie Booze, MD;  Location:  [place] [facility];  Service: Cardiovascular;  Laterality: N/A;   ESOPHAGOGASTRODUODENOSCOPY N/A 09/04/2012   Procedure: ESOPHAGOGASTRODUODENOSCOPY (EGD);  Surgeon: Shann Medal, MD;  Location: [facility];  Service: General;  Laterality: N/A;  PF   ESOPHAGOGASTRODUODENOSCOPY (EGD) WITH ESOPHAGEAL DILATION N/A 09/29/2012   Procedure: ESOPHAGOGASTRODUODENOSCOPY (EGD) WITH ESOPHAGEAL DILATION;  Surgeon: Milus Banister, MD;  Location: [facility];  Service: Endoscopy;  Laterality: N/A;   FEMORAL ARTERY EXPLORATION Right 01/17/2019   Procedure: Exploration Right Groin with Primary Closure or Arteriotomy Site; Washout of Retroperitoneal Hematoma;  Surgeon: Waynetta Sandy, MD;  Location: [place];  Service: Vascular;  Laterality: Right;   INSERTION OF DIALYSIS CATHETER N/A 01/03/2015   Procedure: INSERTION OF DIALYSIS CATHETER RIGHT INTERNAL JUGULAR;  Surgeon: Angelia Mould, MD;  Location: [place];  Service: Vascular;  Laterality: N/A;   LAPAROSCOPIC GASTRIC SLEEVE RESECTION N/A 07/19/2012   Procedure: LAPAROSCOPIC SLEEVE GASTRECTOMY with EGD;  Surgeon: Madilyn Hook, DO;  Location: [facility];  Service: General;  Laterality: N/A;  laparoscopic sleeve gastrectomy with EGD   LEFT HEART CATH AND CORONARY ANGIOGRAPHY N/A 02/25/2018   Procedure: LEFT HEART CATH AND CORONARY ANGIOGRAPHY;  Surgeon: Leonie Man, MD;  Location: [place] [facility];  Service: Cardiovascular;  Laterality: N/A;   LEFT HEART CATH AND CORONARY ANGIOGRAPHY N/A 06/20/2018   Procedure: LEFT HEART CATH AND CORONARY ANGIOGRAPHY;  Surgeon: Nelva Bush, MD;  Location: [place];  Service: Cardiovascular;  Laterality: N/A;   LEFT HEART CATH AND CORONARY ANGIOGRAPHY N/A 01/16/2019   Procedure: LEFT HEART CATH AND CORONARY ANGIOGRAPHY;  Surgeon: Jettie Booze, MD;  Location: [facility];  Service: Cardiovascular;  Laterality: N/A;   LEFT HEART CATH AND CORONARY ANGIOGRAPHY N/A 03/03/2019   Procedure: LEFT  HEART CATH AND CORONARY ANGIOGRAPHY;  Surgeon: Jettie Booze, MD;  Location: [facility];  Service: Cardiovascular;  Laterality: N/A;   PERIPHERAL VASCULAR BALLOON ANGIOPLASTY Left 08/18/2019   Procedure: PERIPHERAL VASCULAR BALLOON ANGIOPLASTY;  Surgeon: Marty Heck, MD;  Location: [facility];  Service: Cardiovascular;  Laterality: Left;  arm fistula   PERIPHERAL VASCULAR CATHETERIZATION Left 05/23/2015   Procedure: Nolon Stalls;  Surgeon: Conrad Grand Tower, MD;  Location: [place] [facility];  Service: Cardiovascular;  Laterality: Left;  upper aRM   PERIPHERAL VASCULAR CATHETERIZATION Left 05/23/2015   Procedure: Peripheral Vascular Balloon Angioplasty;  Surgeon: Jannette Fogo  Bridgett Larsson, MD;  Location: [facility];  Service: Cardiovascular;  Laterality: Left;  av fistula   TUBAL LIGATION  1993   UPPER GI ENDOSCOPY N/A 07/19/2012   Procedure: UPPER GI ENDOSCOPY;  Surgeon: Madilyn Hook, DO;  Location: [facility];  Service: General;  Laterality: N/A;     Social History:   reports that she quit smoking about 19 years ago. Her smoking use included cigarettes. She has a 16.00 pack-year smoking history. She has never used smokeless tobacco. She reports that she does not drink alcohol and does not use drugs.   Family History:  Her family history includes Arthritis in an other family member; Diabetes in her father; Hyperlipidemia in an other family member; Hypertension in her mother; Mitral valve prolapse in her mother. There is no history of Cancer, Heart disease, Kidney disease, or Stroke.   Allergies Allergies  Allergen Reactions   Amlodipine Swelling   Atorvastatin     Elevated LFT's   Clonidine Derivatives Swelling    Limbs swell   Doxycycline Nausea And Vomiting    "I threw up for 3 hours"   Welchol [Colesevelam Hcl] Nausea Only     Home Medications  Prior to Admission medications   Medication Sig Start Date End Date Taking? Authorizing Provider  acetaminophen (TYLENOL) 500  MG tablet Take 1,000 mg by mouth every 6 (six) hours as needed for moderate pain or headache.   Yes [provider]  B Complex-C-Biotin-D-FA (DIALYVITE 800 PLUS D) 800 MCG WAFR Take by mouth. 09/04/19  Yes [provider]  calcium acetate (PHOSLO) 667 MG capsule Take 667-1,334 mg by mouth See admin instructions. Take 1334 mg with each meal and 667 mg with each snack 06/02/18  Yes [provider]  carvedilol (COREG) 25 MG tablet Take 1 tablet (25 mg total) by mouth 2 (two) times daily. 04/23/20  Yes Skeet Latch, MD  cinacalcet (SENSIPAR) 30 MG tablet Take 30 mg by mouth 3 (three) times a week. Mon, Wed, Fri 04/11/20  Yes [provider]  diltiazem (CARDIZEM CD) 120 MG 24 hr capsule Take 1 capsule (120 mg total) by mouth daily. 02/28/20 08/09/20 Yes Skeet Latch, MD  gabapentin (NEURONTIN) 100 MG capsule Take 100 mg by mouth 2 (two) times daily. 10/31/18  Yes [provider]  isosorbide mononitrate (IMDUR) 30 MG 24 hr tablet Take 1 tablet (30 mg total) by mouth daily. 04/24/20 08/09/20 Yes Cleaver, Jossie Ng, NP  nitroGLYCERIN (NITROSTAT) 0.4 MG SL tablet Place 1 tablet (0.4 mg total) under the tongue every 5 (five) minutes as needed for chest pain. 07/26/20  Yes Skeet Latch, MD  ondansetron (ZOFRAN) 4 MG tablet Take 1 tablet (4 mg total) by mouth every 8 (eight) hours as needed for nausea or vomiting. 7/65/46  Yes Delora Fuel, MD  rosuvastatin (CRESTOR) 20 MG tablet Take 1 tablet (20 mg total) by mouth daily. 04/23/20  Yes Skeet Latch, MD  ticagrelor (BRILINTA) 90 MG TABS tablet Take 1 tablet (90 mg total) by mouth 2 (two) times daily. 05/28/20  Yes Skeet Latch, MD  calcitRIOL (ROCALTROL) 0.5 MCG capsule Take 1.5 mcg by mouth daily.  04/29/19   [provider]  thiamine (VITAMIN B-1) 50 MG tablet Take 1 tablet (50 mg total) by mouth daily. Patient not taking: No sig reported 03/01/20   [name], MD     Critical care time: see  attending note      Alcus Dad, MD PGY-2 [place]

## 2020-08-19 NOTE — Evaluation (Signed)
Occupational Therapy Evaluation Patient Details Name: Brenda Davis MRN: [id number] DOB: [birth year] Today's Date: 09-09-20    History of Present Illness 55 y.o. female  admitted on 07/30/2020 with end-stage renal disease on dialysis, coronary artery disease, hypertension diabetes and hyperlipidemia who underwent planned EGD and colonoscopy for persistent nausea and vomiting;  during procedure patient suffered PEA cardiac arrest. Intubated 7/19-7/27; EEG encephalopathy; MRI normal. Assumed hypoxic brain injury. CRRT began 7/25 via RIJ catheter.   Clinical Impression   Pt was independent and living with her husband prior to admission. She presents with restlessness, impaired cognition (although she indicates yes and no reliably and follows some commands with delay), significant weakness and dependence in ADL and mobility. Pt has been sedated for over a week and remains on a reduced amount. Rehab potential is best determined by a period of diagnostic therapy. Will follow acutely.     Follow Up Recommendations  CIR    Equipment Recommendations  Hospital bed;Wheelchair (measurements OT);Wheelchair cushion (measurements OT) (hoyer lift)    Recommendations for Other Services       Precautions / Restrictions Precautions Precautions: Fall Precaution Comments: CRRT lines; ICU monitors, Cortrak      Mobility Bed Mobility Overal bed mobility: Needs Assistance Bed Mobility: Rolling Rolling: Total assist;+2 for physical assistance         General bed mobility comments: rolling rt and lt to change bed linens due to soiled    Transfers                 General transfer comment: pt fatigued after bed mobiltiy and cleaning with incr WOB and EOB deferred    Balance                                           ADL either performed or assessed with clinical judgement   ADL                                         General ADL Comments:  dependent     Vision   Additional Comments: turns to voice, blinks upon command, responds to threat with blink     Perception     Praxis      Pertinent Vitals/Pain Pain Assessment: Faces Faces Pain Scale: Hurts even more Pain Location: perineum with pericare Pain Descriptors / Indicators: Grimacing Pain Intervention(s): Monitored during session;Repositioned     Hand Dominance     Extremity/Trunk Assessment Upper Extremity Assessment Upper Extremity Assessment: Generalized weakness (moves arms mostly randomly, but did respond to squeezing hand on L and high five, appears stronger on L)   Lower Extremity Assessment Lower Extremity Assessment: Defer to PT evaluation RLE Deficits / Details: less sponstaneous movement than LLE; +ankle dorsiflexion when asked to move her leg; no active flexion on command; +active hip/knee extension on command; overall grossly 3+/5 LLE Deficits / Details: persistently moving into flexion and over side of bed; would push to extend on command when LE held in hip/knee flexion; grossly 4/5   Cervical / Trunk Assessment Cervical / Trunk Assessment: Other exceptions Cervical / Trunk Exceptions: obese, unable to raise head from mattress, maintains in extension, tends to lean R in bed   Communication Communication Communication: Receptive difficulties;Expressive difficulties   Cognition Arousal/Alertness: Awake/alert Behavior During Therapy:  Restless (spontaneously moving extremities) Overall Cognitive Status: Difficult to assess Area of Impairment: Attention;Following commands;Safety/judgement                   Current Attention Level: Sustained   Following Commands: Follows one step commands inconsistently;Follows one step commands with increased time Safety/Judgement: Decreased awareness of safety     General Comments: pt did squeeze and release with each hand (delayed response); after each LE bent,she would push to straighten (with  delay); mouthed "ok" x 1, indicated yes and no with head nod   General Comments  RN in to assist with large, liquid stool and cleaning pt; no family present to confirm home set-up. Family to be involved in conference with MDs within minutes and did not try to contact them for information    Exercises Exercises: Other exercises Other Exercises Other Exercises: PROM hip/knee/ankle flexion x 5 reps each; pt abducting AROM, adducting back onto bed with PROM   Shoulder Instructions      Home Living Family/patient expects to be discharged to:: Private residence Living Arrangements: Spouse/significant other Available Help at Discharge: Family Type of Home: House Home Access: Stairs to enter Technical brewer of Steps: 2 Entrance Stairs-Rails: None Home Layout: One level     Bathroom Shower/Tub: Teacher, early years/pre: Handicapped height     Home Equipment: Environmental consultant - 2 wheels;Bedside commode;Cane - single point;Shower seat   Additional Comments: per previous record      Prior Functioning/Environment Level of Independence: Independent        Comments: per previous records        OT Problem List: Decreased strength;Decreased activity tolerance;Decreased coordination;Decreased cognition;Decreased safety awareness;Obesity;Impaired UE functional use      OT Treatment/Interventions: Self-care/ADL training;Patient/family education;Balance training;Cognitive remediation/compensation;Therapeutic activities;Neuromuscular education    OT Goals(Current goals can be found in the care plan section) Acute Rehab OT Goals Patient Stated Goal: pt unable OT Goal Formulation: Patient unable to participate in goal setting Time For Goal Achievement: 08/29/20 Potential to Achieve Goals: Fair ADL Goals Pt/caregiver will Perform Home Exercise Program: Increased strength;Both right and left upper extremity;Increased ROM;With minimal assist Additional ADL Goal #1: Pt will  demonstrate ability to follow commands with 75% accuracy. Additional ADL Goal #2: Pt will perform supine<>sit with +2 mod assist. Additional ADL Goal #3: Pt will sit EOB with moderate assist x 10 min.  OT Frequency: Min 2X/week   Barriers to D/C:            Co-evaluation PT/OT/SLP Co-Evaluation/Treatment: Yes Reason for Co-Treatment: For patient/therapist safety;Complexity of the patient's impairments (multi-system involvement) PT goals addressed during session: Other (comment);Mobility/safety with mobility (following commands) OT goals addressed during session: Strengthening/ROM;ADL's and self-care      AM-PAC OT "6 Clicks" Daily Activity     Outcome Measure Help from another person eating meals?: Total Help from another person taking care of personal grooming?: Total Help from another person toileting, which includes using toliet, bedpan, or urinal?: Total Help from another person bathing (including washing, rinsing, drying)?: Total Help from another person to put on and taking off regular upper body clothing?: Total Help from another person to put on and taking off regular lower body clothing?: Total 6 Click Score: 6   End of Session Equipment Utilized During Treatment: Oxygen Nurse Communication: Mobility status (assisted with pericare, linen change)  Activity Tolerance: Patient limited by fatigue (restless) Patient left: in bed;with call bell/phone within reach;with nursing/sitter in room  OT Visit Diagnosis: Muscle weakness (generalized) (  M62.81);Other symptoms and signs involving cognitive function                Time: 1310-1343 OT Time Calculation (min): 33 min Charges:  OT General Charges $OT Visit: 1 Visit OT Evaluation $OT Eval High Complexity: 1 High  Nestor Lewandowsky, OTR/L Acute Rehabilitation Services Pager: [phone] Office: [phone]   Malka So 09/05/20, 3:05 PM

## 2020-08-19 NOTE — Progress Notes (Signed)
Noted HR and Respiratory changes in patient while giving report to the oncoming nurse. Upon walking in the room pt found not breathing and heart rate in 30's. Pt is a DNR. Oncoming RN stood with patient while I called her husband, Elberta Fortis.

## 2020-08-19 NOTE — Progress Notes (Signed)
Rehab Admissions Coordinator Note:  Patient was screened by Cleatrice Burke for appropriateness for an Inpatient Acute Rehab Consult per therapy recs. Patient currently not demonstrating the tolerance for intensive CIR level rehab. We will follow her progress to continue to assess tolerance. I will not place a rehab consult at this time, but our CIR team will follow.  Cleatrice Burke RN MSN 09/07/20, 2:15 PM  I can be reached at [phone].

## 2020-08-19 NOTE — Progress Notes (Signed)
[employer] set up video visit with pt's sister

## 2020-08-19 NOTE — Progress Notes (Addendum)
Subjective: Extubated yesterday afternoon. She was having some difficulties with handling secretions so a nasal trumpet was placed.   Precedex was turned down overnight and was at 0.78m/hr during my visit with her today.   Husband is at bedside this morning.  Her mental status is improved today. Nursing staff noted that she was following commands inconsistently however I was able to get her to follow all of my commands.  Discussed imaging and EEG findings and their purposes with her husband.   At the end of the visit, the critical care team stopped in and started discussing some goals of care. We explained that, although she did suffer anoxic injury, her improvement in mental status is a good sign. Critical care also discussed that her ability to handle secretions is still a concern and, in the event that her respiratory status were to deteriorate, she would likely need a tracheostomy. Her husband implies that he and YNeyahad discussed life support in the past, and she would not want to be hooked back up to a ventilator, to have a tracheostomy, or a PEG tube.   Objective: Current vital signs: BP (!) 109/50   Pulse 62   Temp (!) 97.5 F (36.4 C) (Axillary)   Resp (!) 21   Ht 5' 5"  (1.651 m)   Wt 122.8 kg   SpO2 100%   BMI 45.05 kg/m  Vital signs in last 24 hours: Temp:  [97.5 F (36.4 C)-98.2 F (36.8 C)] 97.5 F (36.4 C) (07/28 0400) Pulse Rate:  [54-78] 62 (07/28 0800) Resp:  [17-29] 21 (07/28 0800) SpO2:  [95 %-100 %] 100 % (07/28 0800) Arterial Line BP: (93-131)/(40-67) 112/62 (07/28 0800) Weight:  [122.8 kg] 122.8 kg (07/28 0424)  Intake/Output from previous day: 07/27 0701 - 07/28 0700 In: 1544.3 [I.V.:739.3; NG/GT:805] Out: 1138  Intake/Output this shift: Total I/O In: 72.5 [I.V.:22.5; NG/GT:50] Out: 444[Other:49] Nutritional status:  Diet Order             Diet NPO time specified  Diet effective now                  General: remains critically ill  appearing, on CRRT. Secretions dripping from the right side of her mouth.  Pulm: O2 saturations 100% on [place]. Intermittent wet sounded coughing but unable to produce mucous--had to be suctioned. Upper respiratory sounds and diffuse rhonchi throughout.   Neurologic Exam: on 0.492mhr precedex Mental status: lethargic. Opens eyes and looks toward voice. Follows commands. When I asked if she was in pain, she did attempt to verbalize "no". Cranial nerves: EOMs intact. No obvious facial droop. Hearing seems intact bilaterally. Gag reflex intact.  Motor: followed commands to move all extremities. Moves upper extremities to antigravity. Unable to move lower extremities to antigravity but did wiggle her toes on command. Weak hand drip strength.  Sensory: withdraws to noxious stimuli in all extremities. Plantar reflex: toes up-going bilaterally   Lab Results: Results for orders placed or performed during the hospital encounter of 08/07/2020 (from the past 48 hour(s))  Ammonia     Status: Abnormal   Collection Time: 08/13/20  9:43 AM  Result Value Ref Range   Ammonia 38 (H) 9 - 35 umol/L    Comment: Performed at [facility] [id number], capillary     Status: Abnormal   Collection Time: 08/13/20 11:17 AM  Result Value Ref Range   Glucose-Capillary 189 (H) 70 - 99 mg/dL  Comment: Glucose reference range applies only to samples taken after fasting for at least 8 hours.  Renal function panel (daily at 1600)     Status: Abnormal   Collection Time: 08/13/20  3:04 PM  Result Value Ref Range   Sodium 134 (L) 135 - 145 mmol/L   Potassium 4.9 3.5 - 5.1 mmol/L   Chloride 99 98 - 111 mmol/L   CO2 26 22 - 32 mmol/L   Glucose, Bld 171 (H) 70 - 99 mg/dL    Comment: Glucose reference range applies only to samples taken after fasting for at least 8 hours.   BUN 62 (H) 6 - 20 mg/dL   Creatinine, Ser 4.87 (H) 0.44 - 1.00 mg/dL   Calcium 8.6 (L) 8.9 - 10.3 mg/dL    Phosphorus 4.6 2.5 - 4.6 mg/dL   Albumin 2.2 (L) 3.5 - 5.0 g/dL   GFR, Estimated 10 (L) >60 mL/min    Comment: (NOTE) Calculated using the CKD-EPI Creatinine Equation (2021)    Anion gap 9 5 - 15    Comment: Performed at [facility] [address], [place], [place] [address]  APTT     Status: Abnormal   Collection Time: 08/13/20  3:21 PM  Result Value Ref Range   aPTT 50 (H) 24 - 36 seconds    Comment:        IF BASELINE aPTT IS ELEVATED, SUGGEST PATIENT RISK ASSESSMENT BE USED TO DETERMINE APPROPRIATE ANTICOAGULANT THERAPY. Performed at [facility], [place] [address], [place], [place] [address]   Glucose, capillary     Status: Abnormal   Collection Time: 08/13/20  3:28 PM  Result Value Ref Range   Glucose-Capillary 153 (H) 70 - 99 mg/dL    Comment: Glucose reference range applies only to samples taken after fasting for at least 8 hours.  Glucose, capillary     Status: Abnormal   Collection Time: 08/13/20  7:25 PM  Result Value Ref Range   Glucose-Capillary 155 (H) 70 - 99 mg/dL    Comment: Glucose reference range applies only to samples taken after fasting for at least 8 hours.  Glucose, capillary     Status: Abnormal   Collection Time: 08/13/20 11:18 PM  Result Value Ref Range   Glucose-Capillary 189 (H) 70 - 99 mg/dL    Comment: Glucose reference range applies only to samples taken after fasting for at least 8 hours.  CBC     Status: Abnormal   Collection Time: 08/14/20  3:07 AM  Result Value Ref Range   WBC 10.6 (H) 4.0 - 10.5 K/uL   RBC 2.32 (L) 3.87 - 5.11 MIL/uL   Hemoglobin 7.2 (L) 12.0 - 15.0 g/dL   HCT 22.3 (L) 36.0 - 46.0 %   MCV 96.1 80.0 - 100.0 fL   MCH 31.0 26.0 - 34.0 pg   MCHC 32.3 30.0 - 36.0 g/dL   RDW 16.4 (H) 11.5 - 15.5 %   Platelets 250 150 - 400 K/uL   nRBC 0.7 (H) 0.0 - 0.2 %    Comment: Performed at [facility] [address], [place], [place] [address]  Renal function panel (daily at 0500)     Status: Abnormal    Collection Time: 08/14/20  3:07 AM  Result Value Ref Range   Sodium 135 135 - 145 mmol/L   Potassium 4.9 3.5 - 5.1 mmol/L   Chloride 99 98 - 111 mmol/L   CO2 27 22 - 32 mmol/L  Glucose, Bld 189 (H) 70 - 99 mg/dL    Comment: Glucose reference range applies only to samples taken after fasting for at least 8 hours.   BUN 46 (H) 6 - 20 mg/dL   Creatinine, Ser 3.38 (H) 0.44 - 1.00 mg/dL   Calcium 8.6 (L) 8.9 - 10.3 mg/dL   Phosphorus 3.4 2.5 - 4.6 mg/dL   Albumin 2.1 (L) 3.5 - 5.0 g/dL   GFR, Estimated 15 (L) >60 mL/min    Comment: (NOTE) Calculated using the CKD-EPI Creatinine Equation (2021)    Anion gap 9 5 - 15    Comment: Performed at [facility] [address], [place], [place] [address]  Magnesium     Status: Abnormal   Collection Time: 08/14/20  3:07 AM  Result Value Ref Range   Magnesium 2.7 (H) 1.7 - 2.4 mg/dL    Comment: Performed at [facility] [address] [place], [place] [address]  APTT     Status: Abnormal   Collection Time: 08/14/20  3:07 AM  Result Value Ref Range   aPTT 46 (H) 24 - 36 seconds    Comment:        IF BASELINE aPTT IS ELEVATED, SUGGEST PATIENT RISK ASSESSMENT BE USED TO DETERMINE APPROPRIATE ANTICOAGULANT THERAPY. Performed at [facility], [place] [address] [place], [place] [address]   Glucose, capillary     Status: Abnormal   Collection Time: 08/14/20  3:18 AM  Result Value Ref Range   Glucose-Capillary 180 (H) 70 - 99 mg/dL    Comment: Glucose reference range applies only to samples taken after fasting for at least 8 hours.  Glucose, capillary     Status: Abnormal   Collection Time: 08/14/20  7:22 AM  Result Value Ref Range   Glucose-Capillary 181 (H) 70 - 99 mg/dL    Comment: Glucose reference range applies only to samples taken after fasting for at least 8 hours.  Glucose, capillary     Status: None   Collection Time: 08/14/20  3:19 PM  Result Value Ref Range   Glucose-Capillary 77 70 - 99 mg/dL    Comment:  Glucose reference range applies only to samples taken after fasting for at least 8 hours.  I-STAT 7, (LYTES, BLD GAS, ICA, H+H)     Status: Abnormal   Collection Time: 08/14/20  4:12 PM  Result Value Ref Range   pH, Arterial 7.457 (H) 7.350 - 7.450   pCO2 arterial 39.9 32.0 - 48.0 mmHg   pO2, Arterial 100 83.0 - 108.0 mmHg   Bicarbonate 28.3 (H) 20.0 - 28.0 mmol/L   TCO2 30 22 - 32 mmol/L   O2 Saturation 98.0 %   Acid-Base Excess 4.0 (H) 0.0 - 2.0 mmol/L   Sodium 137 135 - 145 mmol/L   Potassium 4.9 3.5 - 5.1 mmol/L   Calcium, Ion 1.22 1.15 - 1.40 mmol/L   HCT 23.0 (L) 36.0 - 46.0 %   Hemoglobin 7.8 (L) 12.0 - 15.0 g/dL   Patient temperature 97.7 F    Collection site Magazine features editor by Operator    Sample type ARTERIAL   Renal function panel (daily at 1600)     Status: Abnormal   Collection Time: 08/14/20  5:34 PM  Result Value Ref Range   Sodium 134 (L) 135 - 145 mmol/L   Potassium 5.0 3.5 - 5.1 mmol/L   Chloride 102 98 - 111 mmol/L   CO2 26 22 - 32  mmol/L   Glucose, Bld 120 (H) 70 - 99 mg/dL    Comment: Glucose reference range applies only to samples taken after fasting for at least 8 hours.   BUN 37 (H) 6 - 20 mg/dL   Creatinine, Ser 2.92 (H) 0.44 - 1.00 mg/dL   Calcium 8.5 (L) 8.9 - 10.3 mg/dL   Phosphorus 3.3 2.5 - 4.6 mg/dL   Albumin 2.0 (L) 3.5 - 5.0 g/dL   GFR, Estimated 18 (L) >60 mL/min    Comment: (NOTE) Calculated using the CKD-EPI Creatinine Equation (2021)    Anion gap 6 5 - 15    Comment: Performed at [facility] [address] [place], [place] [address]  Glucose, capillary     Status: Abnormal   Collection Time: 08/14/20  7:37 PM  Result Value Ref Range   Glucose-Capillary 146 (H) 70 - 99 mg/dL    Comment: Glucose reference range applies only to samples taken after fasting for at least 8 hours.  Glucose, capillary     Status: Abnormal   Collection Time: August 26, 2020 12:11 AM  Result Value Ref Range   Glucose-Capillary 197 (H) 70 - 99 mg/dL     Comment: Glucose reference range applies only to samples taken after fasting for at least 8 hours.  CBC     Status: Abnormal   Collection Time: 08-26-20  4:10 AM  Result Value Ref Range   WBC 8.7 4.0 - 10.5 K/uL   RBC 2.26 (L) 3.87 - 5.11 MIL/uL   Hemoglobin 7.0 (L) 12.0 - 15.0 g/dL    Comment: REPEATED TO VERIFY   HCT 22.1 (L) 36.0 - 46.0 %   MCV 97.8 80.0 - 100.0 fL   MCH 31.0 26.0 - 34.0 pg   MCHC 31.7 30.0 - 36.0 g/dL   RDW 16.1 (H) 11.5 - 15.5 %   Platelets 253 150 - 400 K/uL   nRBC 0.3 (H) 0.0 - 0.2 %    Comment: Performed at [facility] [address], [place], [place] [address]  Renal function panel (daily at 0500)     Status: Abnormal   Collection Time: 08-26-2020  4:10 AM  Result Value Ref Range   Sodium 133 (L) 135 - 145 mmol/L   Potassium 5.2 (H) 3.5 - 5.1 mmol/L   Chloride 102 98 - 111 mmol/L   CO2 27 22 - 32 mmol/L   Glucose, Bld 184 (H) 70 - 99 mg/dL    Comment: Glucose reference range applies only to samples taken after fasting for at least 8 hours.   BUN 31 (H) 6 - 20 mg/dL   Creatinine, Ser 2.20 (H) 0.44 - 1.00 mg/dL   Calcium 8.8 (L) 8.9 - 10.3 mg/dL   Phosphorus 3.2 2.5 - 4.6 mg/dL   Albumin 2.1 (L) 3.5 - 5.0 g/dL   GFR, Estimated 26 (L) >60 mL/min    Comment: (NOTE) Calculated using the CKD-EPI Creatinine Equation (2021)    Anion gap 4 (L) 5 - 15    Comment: Performed at [facility] [address], [place], [place] [address]  Magnesium     Status: Abnormal   Collection Time: Aug 26, 2020  4:10 AM  Result Value Ref Range   Magnesium 2.6 (H) 1.7 - 2.4 mg/dL    Comment: Performed at [facility] [address] [place], [place] [address]  APTT     Status: Abnormal   Collection Time: 2020/08/26  4:10 AM  Result Value Ref Range  aPTT 43 (H) 24 - 36 seconds    Comment:        IF BASELINE aPTT IS ELEVATED, SUGGEST PATIENT RISK ASSESSMENT BE USED TO DETERMINE APPROPRIATE ANTICOAGULANT THERAPY. Performed at [facility], [place]  [address], [place], [place] [address]   Glucose, capillary     Status: Abnormal   Collection Time: 08/24/2020  4:19 AM  Result Value Ref Range   Glucose-Capillary 163 (H) 70 - 99 mg/dL    Comment: Glucose reference range applies only to samples taken after fasting for at least 8 hours.  Glucose, capillary     Status: Abnormal   Collection Time: August 24, 2020  8:01 AM  Result Value Ref Range   Glucose-Capillary 155 (H) 70 - 99 mg/dL    Comment: Glucose reference range applies only to samples taken after fasting for at least 8 hours.    Recent Results (from the past 240 hour(s))  Resp Panel by RT-PCR (Flu A&B, Covid) Nasopharyngeal Swab     Status: None   Collection Time: 07/24/2020 12:58 PM   Specimen: Nasopharyngeal Swab; Nasopharyngeal(NP) swabs in vial transport medium  Result Value Ref Range Status   SARS Coronavirus 2 by RT PCR NEGATIVE NEGATIVE Final    Comment: (NOTE) SARS-CoV-2 target nucleic acids are NOT DETECTED.  The SARS-CoV-2 RNA is generally detectable in upper respiratory specimens during the acute phase of infection. The lowest concentration of SARS-CoV-2 viral copies this assay can detect is 138 copies/mL. A negative result does not preclude SARS-Cov-2 infection and should not be used as the sole basis for treatment or other patient management decisions. A negative result may occur with  improper specimen collection/handling, submission of specimen other than nasopharyngeal swab, presence of viral mutation(s) within the areas targeted by this assay, and inadequate number of viral copies(<138 copies/mL). A negative result must be combined with clinical observations, patient history, and epidemiological information. The expected result is Negative.  Fact Sheet for Patients:  EntrepreneurPulse.com.au  Fact Sheet for Healthcare Providers:  IncredibleEmployment.be  This test is no t yet approved or cleared by the [place] FDA and  has been  authorized for detection and/or diagnosis of SARS-CoV-2 by FDA under an Emergency Use Authorization (EUA). This EUA will remain  in effect (meaning this test can be used) for the duration of the COVID-19 declaration under Section 564(b)(1) of the Act, 21 U.S.C.section 360bbb-3(b)(1), unless the authorization is terminated  or revoked sooner.       Influenza A by PCR NEGATIVE NEGATIVE Final   Influenza B by PCR NEGATIVE NEGATIVE Final    Comment: (NOTE) The Xpert Xpress SARS-CoV-2/FLU/RSV plus assay is intended as an aid in the diagnosis of influenza from Nasopharyngeal swab specimens and should not be used as a sole basis for treatment. Nasal washings and aspirates are unacceptable for Xpert Xpress SARS-CoV-2/FLU/RSV testing.  Fact Sheet for Patients: EntrepreneurPulse.com.au  Fact Sheet for Healthcare Providers: IncredibleEmployment.be  This test is not yet approved or cleared by the [place] FDA and has been authorized for detection and/or diagnosis of SARS-CoV-2 by FDA under an Emergency Use Authorization (EUA). This EUA will remain in effect (meaning this test can be used) for the duration of the COVID-19 declaration under Section 564(b)(1) of the Act, 21 U.S.C. section 360bbb-3(b)(1), unless the authorization is terminated or revoked.  Performed at [facility], [place] [address], [place], [place] [address]   Culture, blood (routine x 2)     Status: None   Collection Time:  07/23/2020  3:25 PM   Specimen: BLOOD  Result Value Ref Range Status   Specimen Description BLOOD RIGHT ANTECUBITAL  Final   Special Requests   Final    BOTTLES DRAWN AEROBIC AND ANAEROBIC Blood Culture adequate volume   Culture   Final    NO GROWTH 5 DAYS Performed at [facility], [address] [place], [place] [address]    Report Status 08/11/2020 FINAL  Final  Culture, blood (routine x 2)     Status: None   Collection Time: 08/18/2020  3:38 PM    Specimen: BLOOD  Result Value Ref Range Status   Specimen Description BLOOD SITE NOT SPECIFIED  Final   Special Requests   Final    BOTTLES DRAWN AEROBIC AND ANAEROBIC Blood Culture adequate volume   Culture   Final    NO GROWTH 5 DAYS Performed at [facility], [place] [address], [place], [place] [address]    Report Status 08/11/2020 FINAL  Final  MRSA Next Gen by PCR, Nasal     Status: None   Collection Time: 08/08/2020  4:31 PM   Specimen: Nasal Mucosa; Nasal Swab  Result Value Ref Range Status   MRSA by PCR Next Gen NOT DETECTED NOT DETECTED Final    Comment: (NOTE) The GeneXpert MRSA Assay (FDA approved for NASAL specimens only), is one component of a comprehensive MRSA colonization surveillance program. It is not intended to diagnose MRSA infection nor to guide or monitor treatment for MRSA infections. Test performance is not FDA approved in patients less than 9 years old. Performed at [facility], [place] [address], [place], [place] [address]     Lipid Panel No results for input(s): CHOL, TRIG, HDL, CHOLHDL, VLDL, LDLCALC in the last 72 hours.  Studies/Results: DG Abd Portable 1V  Result Date: 08/14/2020 CLINICAL DATA:  Cortrak feeding tube placement EXAM: PORTABLE ABDOMEN - 1 VIEW COMPARISON:  None. FINDINGS: Feeding tube tip is in the mid to distal stomach. Nonobstructive bowel gas pattern. IMPRESSION: Feeding tube tip in the mid to distal stomach. Electronically Signed   By: Rolm Baptise M.D.   On: 08/14/2020 16:23    Medications: Scheduled:  aspirin  81 mg Per Tube Daily   chlorhexidine  15 mL Mouth Rinse BID   Chlorhexidine Gluconate Cloth  6 each Topical Daily   darbepoetin (ARANESP) injection - DIALYSIS  60 mcg Intravenous Q Mon-HD   famotidine  10 mg Per Tube Daily   feeding supplement (PROSource TF)  45 mL Per Tube 5 X Daily   heparin injection (subcutaneous)  5,000 Units Subcutaneous Q8H   insulin aspart  0-15 Units Subcutaneous Q4H   insulin  aspart  5 Units Subcutaneous Q4H   mouth rinse  15 mL Mouth Rinse q12n4p   midodrine  10 mg Per Tube Q8H   multivitamin  1 tablet Per Tube Daily   polyethylene glycol  17 g Per Tube BID   rosuvastatin  20 mg Per Tube Daily   sennosides  5 mL Per Tube BID   sodium chloride flush  10-40 mL Intracatheter Q12H   ticagrelor  90 mg Per Tube BID   Continuous:   prismasol BGK 4/2.5 400 mL/hr at 08/14/20 2215    prismasol BGK 4/2.5 200 mL/hr at 08/14/20 1943   sodium chloride Stopped (08/07/20 1647)   sodium chloride 250 mL (08/07/20 1750)   sodium chloride 10 mL/hr at 08/14/20 0500   sodium chloride 10 mL/hr at 2020/08/23  0800   dexmedetomidine (PRECEDEX) IV infusion 0.4 mcg/kg/hr (2020-08-27 0800)   feeding supplement (VITAL 1.5 CAL) 20 mL/hr at 08/14/20 2000   norepinephrine (LEVOPHED) Adult infusion Stopped (08/14/20 1901)   prismasol BGK 4/2.5 1,800 mL/hr at 08-27-2020 0634    Imaging   08/07/20 CT head w/o contrast: No acute findings   08/08/20 brain MRI: No acute abnormalities. Mild chronic microvascular ischemic disease.   08/05/2020 EEG: profound diffuse encephalopathy. No seizure or epileptiform discharges 08/07/20 EEG: severe diffuse encephalopathy. No seizure or epileptiform discharges   08/07/20 echocardiogram: LVEF 45-50%; LV regional wall motion abnormalities with apical hypokinesis; mild LVH; Grade I diastolic dysfunction Mild RV systolic dysfunction; mild RV enlargement No major valvular abnormalities   Assessment: 55 year old female with ESRD on HD, HFpEF, CAD s/p NSTEMI, and cirrhosis who presented to [facility] on 08/17/2020 after PEA arrest with 10 minutes of resuscitation efforts prior to ROSC. Her mental status has failed to improve despite being off of sedation, so Neurology was consulted for further evaluation. - Exam today is improved. She is following commands and seems to be somewhat interacting with her surroundings. - Suspect anoxic brain injury s/p PEA arrest. It is  likely that she had poor cardiovascular reserve in the setting of her multiple other comorbidies including anemia and heart failure, which can explain anoxic brain injury despite relatively short resuscitation time prior to ROSC. - Metabolic causes, including uremia and hepatic encephalopathy could be contributing as well, given the somewhat atypical appearance of her exam for anoxic encephalopathy. It should be noted, however, that anoxia is highly likely to be playing a significant role. - Liver cirrhosis is also noted on her problem list and abdominal ultrasound from 2020 is consistent with this. Lactulose listed on her med list from her HD center however unclear if she was taking this at home. ALT mildly elevated on admission. May consider hepatic encephalopathy. - EEG x 2 were negative for electrographic seizures, but consistent with severe encephalopathy. - CT and brain MRI are normal  - Ammonia of 38 elevated slightly above the upper level of normal (drawn on 7/26)   Recommendations: Continue supportive care. No further recommendations at this time. Continue to work on weaning sedation. Neurology will sign off. Please feel free to call with questions  Acute hypoxic respiratory failure.  - Extubated 7/27  Goals of care - Palliative consulted 7/27.  - Family meeting planned for 2pm today  High risk malnutrition - Cortrak placed 7/27  ESRD on HD. On CRRT. Management per nephrology.     LOS: 9 days   Mitzi Hansen, MD Internal Medicine Resident PGY-3 [facility] Pager: [phone] 08/27/2020 8:20 AM     Electronically signed: Dr. Kerney Elbe

## 2020-08-19 DEATH — deceased

## 2020-08-27 ENCOUNTER — Encounter (HOSPITAL_BASED_OUTPATIENT_CLINIC_OR_DEPARTMENT_OTHER): Payer: Medicare HMO | Admitting: Pulmonary Disease

## 2020-09-24 ENCOUNTER — Ambulatory Visit: Payer: Medicare HMO | Admitting: Internal Medicine

## 2022-02-02 IMAGING — CT CT ABD-PELV W/O CM
1 of 2 series · 13 of 32 positions shown, 18 images · non-contrast
Comparison: 01/20/2019

CLINICAL DATA: Persistent right-sided abdominal pain and palpable
abnormality. Follow-up retroperitoneal hemorrhage. On peritoneal
dialysis.

EXAM:
CT ABDOMEN AND PELVIS WITHOUT CONTRAST
TECHNIQUE: Multidetector CT imaging of the abdomen and pelvis was performed
following the standard protocol without IV contrast.

[Series 2: abd/pelvis w/(date) · axial · 0.98mm/px · z∈[-456,-46]mm · 13 of 92 slices shown, 18 images]
[im 5/92  soft-tissue]
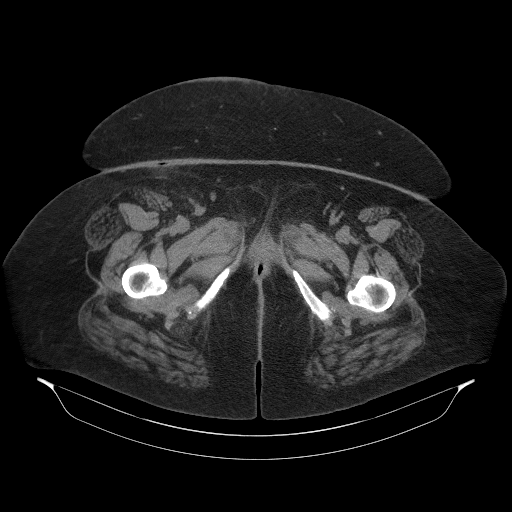
[im 5/92  bone]
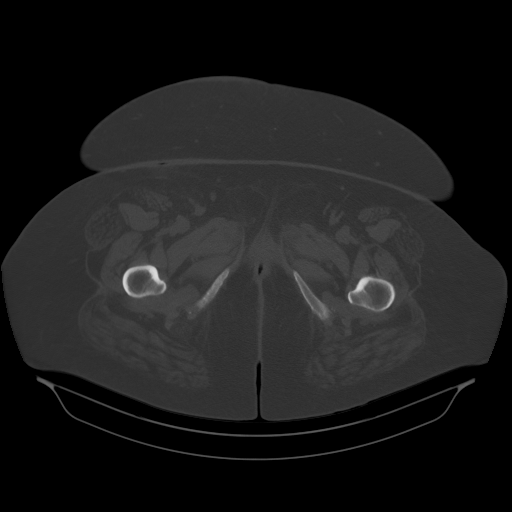
[im 15/92  soft-tissue]
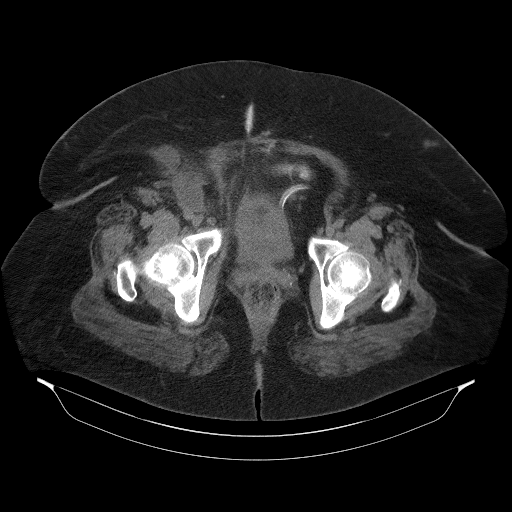
[im 20/92  soft-tissue]
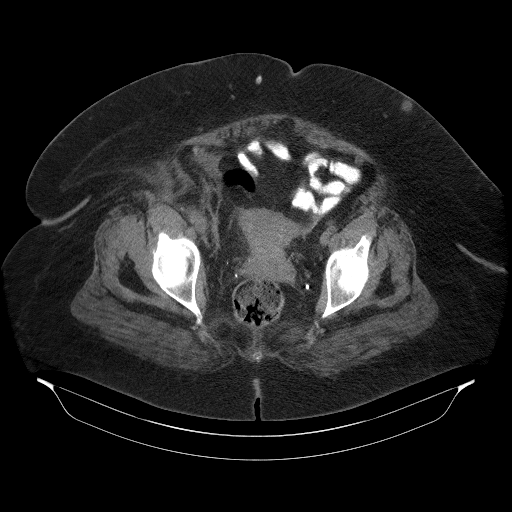
[im 29/92  soft-tissue]
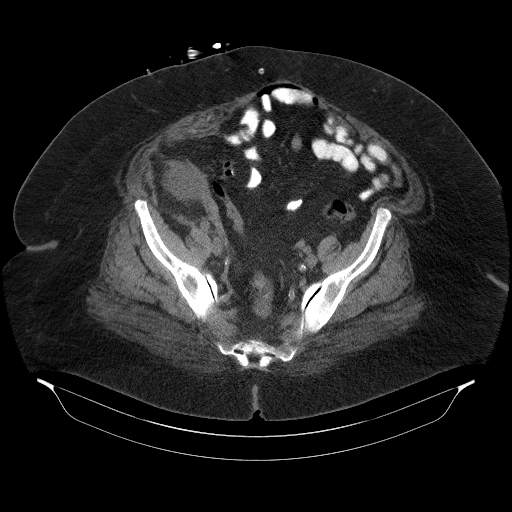
[im 34/92  soft-tissue]
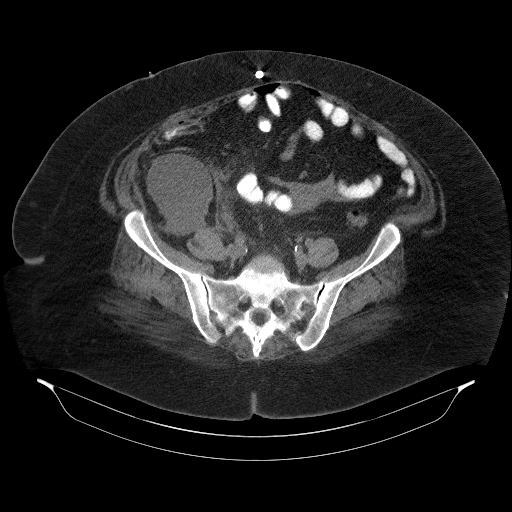
[im 44/92  soft-tissue]
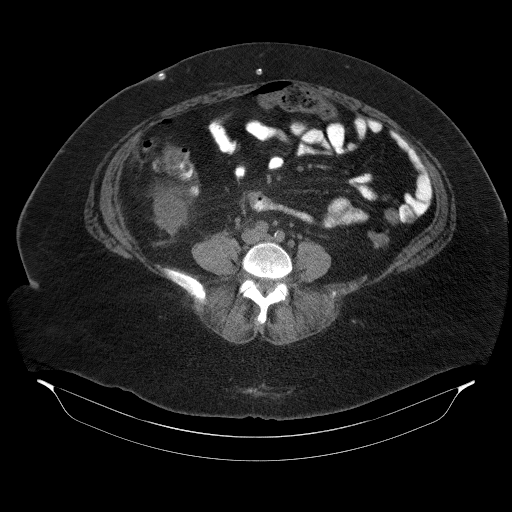
[im 48/92  soft-tissue]
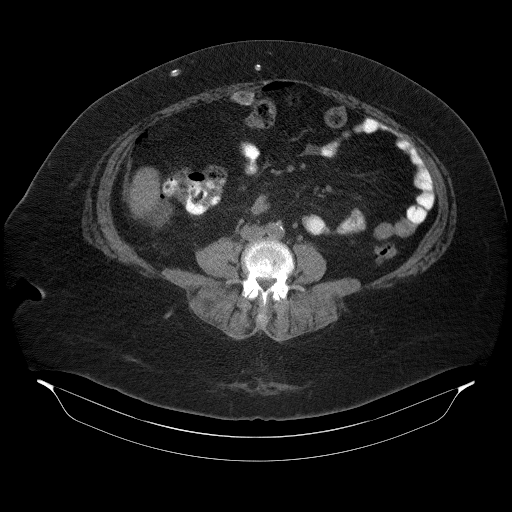
[im 58/92  soft-tissue]
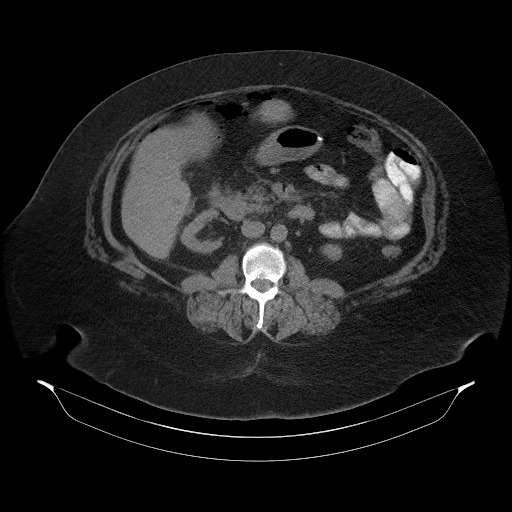
[im 63/92  soft-tissue]
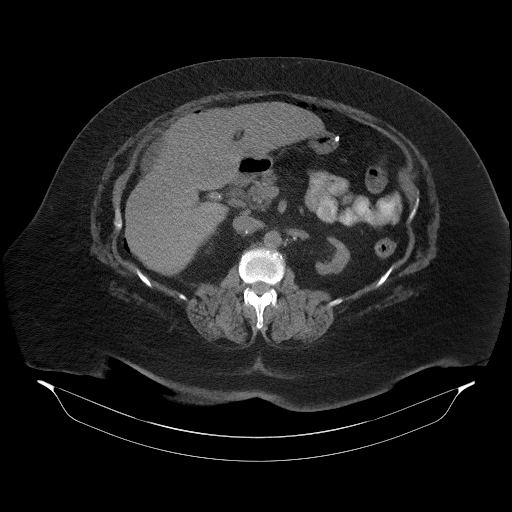
[im 63/92  bone]
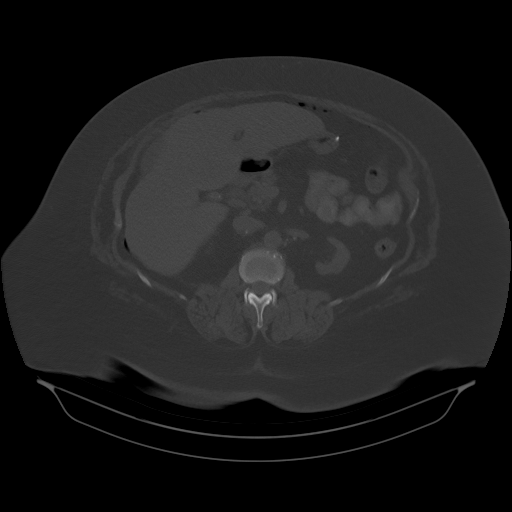
[im 72/92  soft-tissue]
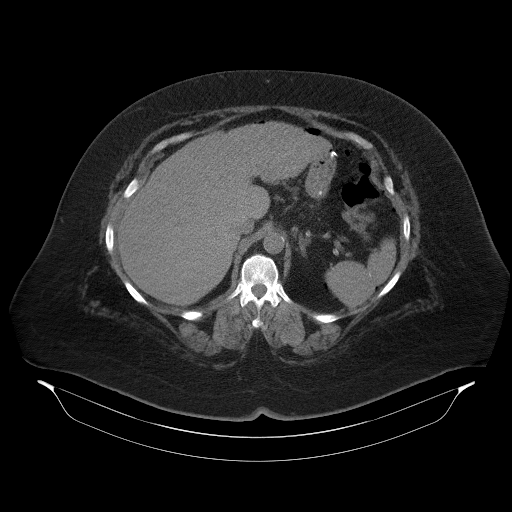
[im 72/92  lung]
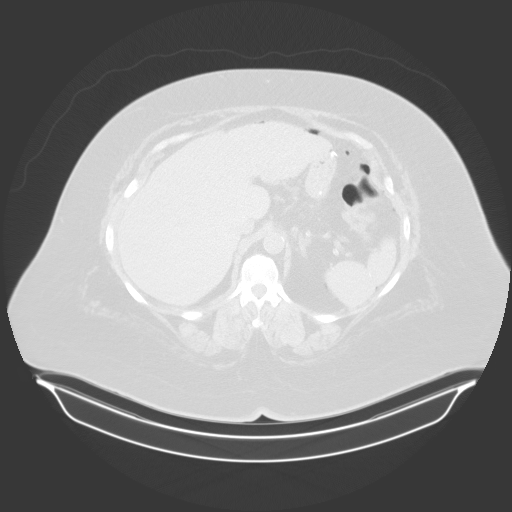
[im 77/92  soft-tissue]
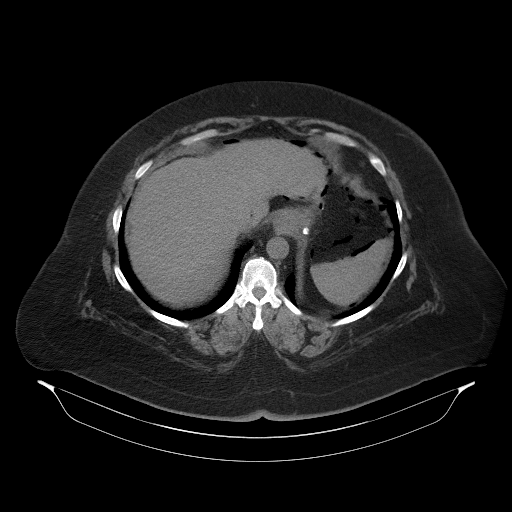
[im 77/92  lung]
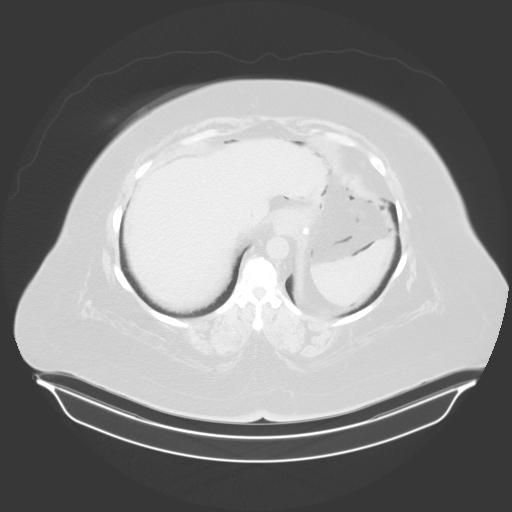
[im 82/92  lung]
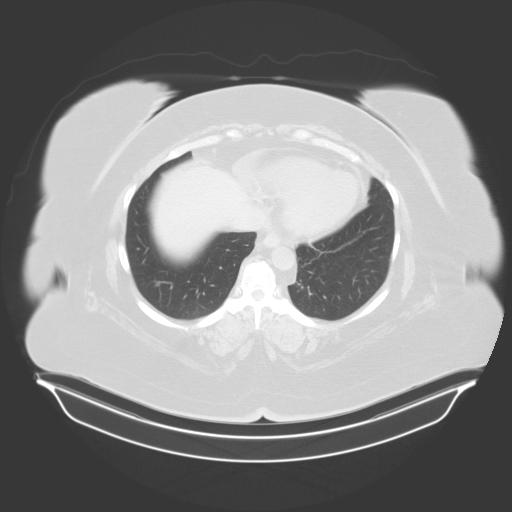
[im 87/92  soft-tissue]
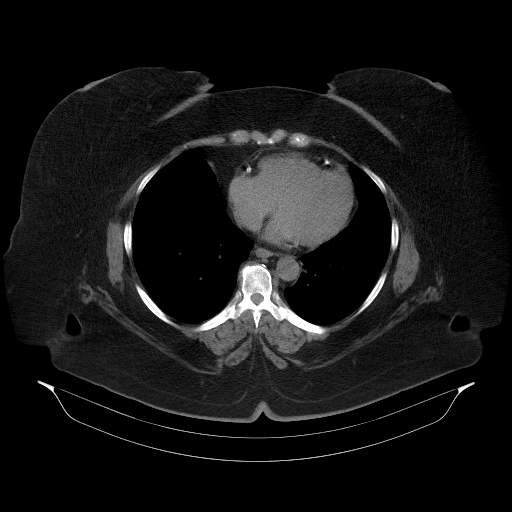
[im 87/92  lung]
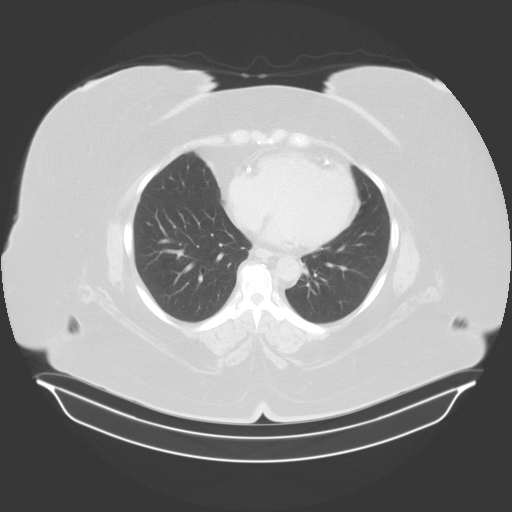

[13 of 32 positions shown; findings below may reference images not displayed]

FINDINGS: Lower chest: No acute findings.

Hepatobiliary: Hepatic cirrhosis again demonstrated. No hepatic mass
visualized on this unenhanced exam. Prior cholecystectomy. No
evidence of biliary obstruction.

Pancreas: No mass or inflammatory process visualized on this
unenhanced exam.

Spleen:  Within normal limits in size.

Adrenals/Urinary tract: No evidence of urolithiasis or
hydronephrosis. Unremarkable unopacified urinary bladder. A
peritoneal dialysis catheter is again seen with tip in the pelvis a
small amount of intraperitoneal air and fluid is again seen, which
is attributable to the peritoneal dialysis.

Stomach/Bowel: No evidence of obstruction, inflammatory process, or
abnormal fluid collections. Diverticulosis is seen mainly involving
the descending and sigmoid colon, however there is no evidence of
diverticulitis.

Vascular/Lymphatic: No pathologically enlarged lymph nodes
identified. No evidence of abdominal aortic aneurysm. Aortic
atherosclerosis incidentally noted.

Reproductive:  No mass or other significant abnormality.

Other: Interval decrease in right retroperitoneal hemorrhage is seen
since previous study. A persistent retroperitoneal low-attenuation
collection is seen in the right lower quadrant which measures 8.6 x
6.0 cm and likely represents a seroma. A smaller low-attenuation
collection is now seen in the right groin which measures 4.2 x
cm and also likely represents a seroma.

Musculoskeletal:  No suspicious bone lesions identified.
IMPRESSION: 1. Interval decrease in right retroperitoneal hemorrhage since
previous study.
2. Persistent low-attenuation collections in the right lower
quadrant retroperitoneum and right groin, likely representing
seromas.
3. Hepatic cirrhosis.
4. Mild ascites and free intraperitoneal air again seen, and
attributable to peritoneal dialysis.
5. Colonic diverticulosis. No radiographic evidence of
diverticulitis.
